# Patient Record
Sex: Male | Born: 1965
Health system: Southern US, Community
[De-identification: ages and names within clinical notes are randomized; demographics above are authoritative.]

## PROBLEM LIST (undated history)

## (undated) DIAGNOSIS — I509 Heart failure, unspecified: Secondary | ICD-10-CM

## (undated) DIAGNOSIS — F329 Major depressive disorder, single episode, unspecified: Secondary | ICD-10-CM

## (undated) DIAGNOSIS — K219 Gastro-esophageal reflux disease without esophagitis: Secondary | ICD-10-CM

## (undated) DIAGNOSIS — B20 Human immunodeficiency virus [HIV] disease: Secondary | ICD-10-CM

## (undated) DIAGNOSIS — I4891 Unspecified atrial fibrillation: Secondary | ICD-10-CM

## (undated) DIAGNOSIS — F419 Anxiety disorder, unspecified: Secondary | ICD-10-CM

## (undated) DIAGNOSIS — I10 Essential (primary) hypertension: Secondary | ICD-10-CM

## (undated) DIAGNOSIS — R0902 Hypoxemia: Secondary | ICD-10-CM

## (undated) DIAGNOSIS — F32A Depression, unspecified: Secondary | ICD-10-CM

## (undated) DIAGNOSIS — J439 Emphysema, unspecified: Secondary | ICD-10-CM

## (undated) DIAGNOSIS — E119 Type 2 diabetes mellitus without complications: Secondary | ICD-10-CM

## (undated) DIAGNOSIS — J449 Chronic obstructive pulmonary disease, unspecified: Secondary | ICD-10-CM

## (undated) HISTORY — DX: Emphysema, unspecified: J43.9

## (undated) HISTORY — DX: Hypoxemia: R09.02

## (undated) HISTORY — PX: EXPLORATORY LAPAROTOMY: SUR591

## (undated) HISTORY — PX: OTHER SURGICAL HISTORY: SHX169

## (undated) HISTORY — DX: Major depressive disorder, single episode, unspecified: F32.9

## (undated) HISTORY — DX: Heart failure, unspecified: I50.9

## (undated) HISTORY — DX: Essential (primary) hypertension: I10

## (undated) HISTORY — DX: Anxiety disorder, unspecified: F41.9

## (undated) HISTORY — DX: Depression, unspecified: F32.A

---

## 2012-09-30 ENCOUNTER — Encounter (HOSPITAL_COMMUNITY): Payer: Self-pay | Admitting: *Deleted

## 2012-09-30 ENCOUNTER — Emergency Department (INDEPENDENT_AMBULATORY_CARE_PROVIDER_SITE_OTHER)
Admission: EM | Admit: 2012-09-30 | Discharge: 2012-09-30 | Disposition: A | Discharge status: Home / Self Care | Payer: Self-pay | Source: Home / Self Care | Attending: Family Medicine | Admitting: Family Medicine

## 2012-09-30 ENCOUNTER — Emergency Department (INDEPENDENT_AMBULATORY_CARE_PROVIDER_SITE_OTHER): Payer: Self-pay

## 2012-09-30 DIAGNOSIS — J441 Chronic obstructive pulmonary disease with (acute) exacerbation: Secondary | ICD-10-CM

## 2012-09-30 HISTORY — DX: Chronic obstructive pulmonary disease, unspecified: J44.9

## 2012-09-30 MED ORDER — ALBUTEROL SULFATE (5 MG/ML) 0.5% IN NEBU
5.0000 mg | INHALATION_SOLUTION | Freq: Once | RESPIRATORY_TRACT | Status: AC
  Administered 2012-09-30: 5 mg via RESPIRATORY_TRACT

## 2012-09-30 MED ORDER — PREDNISONE 20 MG PO TABS: ORAL_TABLET | ORAL | Status: DC

## 2012-09-30 MED ORDER — ALBUTEROL SULFATE (5 MG/ML) 0.5% IN NEBU
INHALATION_SOLUTION | RESPIRATORY_TRACT | Status: AC
  Filled 2012-09-30: qty 1

## 2012-09-30 MED ORDER — FLUTICASONE-SALMETEROL 230-21 MCG/ACT IN AERO: 2.0000 | INHALATION_SPRAY | Freq: Two times a day (BID) | RESPIRATORY_TRACT | Status: DC

## 2012-09-30 MED ORDER — ALBUTEROL SULFATE HFA 108 (90 BASE) MCG/ACT IN AERS
2.0000 | INHALATION_SPRAY | Freq: Four times a day (QID) | RESPIRATORY_TRACT | Status: DC | PRN
  Administered 2012-09-30: 2 via RESPIRATORY_TRACT

## 2012-09-30 MED ORDER — METHYLPREDNISOLONE SODIUM SUCC 125 MG IJ SOLR
125.0000 mg | Freq: Once | INTRAMUSCULAR | Status: AC
  Administered 2012-09-30: 125 mg via INTRAMUSCULAR

## 2012-09-30 MED ORDER — METHYLPREDNISOLONE SODIUM SUCC 125 MG IJ SOLR
INTRAMUSCULAR | Status: AC
  Filled 2012-09-30: qty 2

## 2012-09-30 MED ORDER — ALBUTEROL SULFATE (5 MG/ML) 0.5% IN NEBU: 2.5000 mg | INHALATION_SOLUTION | Freq: Four times a day (QID) | RESPIRATORY_TRACT | Status: DC | PRN

## 2012-09-30 MED ORDER — SODIUM CHLORIDE 0.9 % IJ SOLN
INTRAMUSCULAR | Status: AC
  Filled 2012-09-30: qty 3

## 2012-09-30 MED ORDER — ALBUTEROL SULFATE HFA 108 (90 BASE) MCG/ACT IN AERS
INHALATION_SPRAY | RESPIRATORY_TRACT | Status: AC
  Filled 2012-09-30: qty 6.7

## 2012-09-30 MED ORDER — IPRATROPIUM BROMIDE 0.02 % IN SOLN
0.5000 mg | Freq: Once | RESPIRATORY_TRACT | Status: AC
  Administered 2012-09-30: 0.5 mg via RESPIRATORY_TRACT

## 2012-09-30 NOTE — ED Notes (Signed)
Received report from Nira Conn.

## 2012-09-30 NOTE — ED Notes (Signed)
Pt  Reports  5  Days  Of  Shortness  Of  Breath       He  Has  History  Of  Copd   usually  Takes  meds  But  Ran out  And  Has  No  pcp        Pt  Was  Sent  To  ucc  From      Adult  Care  Side  Because  They  Have  No openings           He        Is  A  Smoker       His  Lung  Sounds  Are  Tight  His skin is  Warm  And  Dry
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
## 2012-11-10 ENCOUNTER — Encounter (HOSPITAL_COMMUNITY): Payer: Self-pay | Admitting: *Deleted

## 2012-11-10 ENCOUNTER — Emergency Department (HOSPITAL_COMMUNITY)
Admission: EM | Admit: 2012-11-10 | Discharge: 2012-11-10 | Disposition: A | Discharge status: Home / Self Care | Payer: Self-pay | Source: Home / Self Care

## 2012-11-10 DIAGNOSIS — J441 Chronic obstructive pulmonary disease with (acute) exacerbation: Secondary | ICD-10-CM

## 2012-11-10 MED ORDER — METHYLPREDNISOLONE 4 MG PO KIT: PACK | ORAL | Status: DC
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
## 2012-11-10 NOTE — ED Notes (Signed)
Patient present to establish care. Refills on medication. Patient states he is HiV positive. Patient states he is with the Adap program and needs a doctor fax refills for medications to [facility].

## 2012-11-17 ENCOUNTER — Telehealth: Payer: Self-pay

## 2012-11-17 NOTE — Telephone Encounter (Signed)
Pt left message on voicemail stating he was referred by Urgent Care.   Call returned and message left on his voicemail to call for new patient appointment. (intake)  Laurell Josephs, RN

## 2012-11-29 ENCOUNTER — Telehealth: Payer: Self-pay

## 2012-11-29 NOTE — Telephone Encounter (Signed)
Pt called.  His ADAP has expired.   He will need to come to see Britta Mccreedy to get his financial clearance with [employer] and ADAP prior to intake.  Once he is approved I can call a script to the pharmacy. He can see me at his schedule intake time.  Laurell Josephs, RN

## 2012-11-29 NOTE — Telephone Encounter (Signed)
Pt will need to schedule intake visit.  Medical records received.  He is not sure if his ADAP is current and he has already called his previous medical provider for a prescription refill.  Medical records indicate his last OV was February 2014. Pt was advised to call the pharmacy to see if ADAP is active. If so we can call in medication prior to  intake.  He will check with pharmacy and call me back. Intake scheduled.    Laurell Josephs, RN

## 2012-12-06 ENCOUNTER — Telehealth: Payer: Self-pay

## 2012-12-06 ENCOUNTER — Other Ambulatory Visit: Payer: Self-pay

## 2012-12-06 ENCOUNTER — Ambulatory Visit (INDEPENDENT_AMBULATORY_CARE_PROVIDER_SITE_OTHER): Payer: Self-pay

## 2012-12-06 ENCOUNTER — Other Ambulatory Visit: Payer: Self-pay | Admitting: *Deleted

## 2012-12-06 ENCOUNTER — Ambulatory Visit: Payer: Self-pay

## 2012-12-06 ENCOUNTER — Other Ambulatory Visit: Payer: Self-pay | Admitting: Infectious Diseases

## 2012-12-06 DIAGNOSIS — B20 Human immunodeficiency virus [HIV] disease: Secondary | ICD-10-CM

## 2012-12-06 DIAGNOSIS — Z79899 Other long term (current) drug therapy: Secondary | ICD-10-CM

## 2012-12-06 DIAGNOSIS — Z113 Encounter for screening for infections with a predominantly sexual mode of transmission: Secondary | ICD-10-CM

## 2012-12-06 LAB — CBC WITH DIFFERENTIAL/PLATELET
Basophils Absolute: 0 10*3/uL (ref 0.0–0.1)
Basophils Relative: 0 % (ref 0–1)
HCT: 40.4 % (ref 39.0–52.0)
Hemoglobin: 13.5 g/dL (ref 13.0–17.0)
Lymphocytes Relative: 43 % (ref 12–46)
MCHC: 33.4 g/dL (ref 30.0–36.0)
Monocytes Absolute: 0.5 10*3/uL (ref 0.1–1.0)
Monocytes Relative: 12 % (ref 3–12)
Neutro Abs: 1.9 10*3/uL (ref 1.7–7.7)
Neutrophils Relative %: 42 % — ABNORMAL LOW (ref 43–77)
WBC: 4.5 10*3/uL (ref 4.0–10.5)

## 2012-12-06 MED ORDER — ETRAVIRINE 100 MG PO TABS: 200.0000 mg | ORAL_TABLET | Freq: Two times a day (BID) | ORAL | Status: DC

## 2012-12-06 MED ORDER — RITONAVIR 100 MG PO CAPS
100.0000 mg | ORAL_CAPSULE | Freq: Two times a day (BID) | ORAL | Status: DC
Start: 2012-12-06 — End: 2012-12-14

## 2012-12-06 MED ORDER — DARUNAVIR ETHANOLATE 600 MG PO TABS: 600.0000 mg | ORAL_TABLET | Freq: Every day | ORAL | Status: DC

## 2012-12-06 MED ORDER — RALTEGRAVIR POTASSIUM 400 MG PO TABS
400.0000 mg | ORAL_TABLET | Freq: Two times a day (BID) | ORAL | Status: DC
Start: 2012-12-06 — End: 2012-12-14

## 2012-12-06 NOTE — Progress Notes (Signed)
Pt has been approved for [employer] and ADAP.  He has been without his medications now for one week.  He gives past history of taking several "breaks " from the medications this year.  Medical records document last CD4 as 40 as of 06-08-12.   We will submit his paperwork along with signed scripts today.   Medical records document last CD4 as 40 as of 06-08-12.   Pt has multiple complaints today:  Rash on face and upper body X 1 week with occasional memory loss which has worsened within the last 2 months.   He has been scheduled for a return visit in two weeks after labs are completed.   No intake was completed at this visit since he was her for financial clearance only.  Information documented obtained from medical record.   Laurell Josephs, RN

## 2012-12-06 NOTE — Telephone Encounter (Signed)
Pt is here to see Britta Mccreedy for financial assistance.  He has been approved for ADAP and [employer].  I will enter his lab orders and schedule a new office visit.   Records received.   Laurell Josephs, RN

## 2012-12-06 NOTE — Telephone Encounter (Signed)
Rx printed to go with patient ADAP application

## 2012-12-06 NOTE — Progress Notes (Unsigned)
Patient states he is having problems with his memory X 4-5 months .  He currently has a rash on his face,  neck and upper body.  He states he has taken several "breaks" in the last few years because he thought he was feeling better.

## 2012-12-07 LAB — URINALYSIS
Nitrite: NEGATIVE
Protein, ur: NEGATIVE mg/dL
Specific Gravity, Urine: 1.02 (ref 1.005–1.030)
Urobilinogen, UA: 1 mg/dL (ref 0.0–1.0)

## 2012-12-07 LAB — COMPLETE METABOLIC PANEL WITH GFR
AST: 14 U/L (ref 0–37)
Albumin: 3.9 g/dL (ref 3.5–5.2)
Alkaline Phosphatase: 34 U/L — ABNORMAL LOW (ref 39–117)
BUN: 7 mg/dL (ref 6–23)
Calcium: 9.2 mg/dL (ref 8.4–10.5)
Chloride: 100 mEq/L (ref 96–112)
Glucose, Bld: 159 mg/dL — ABNORMAL HIGH (ref 70–99)
Potassium: 4.2 mEq/L (ref 3.5–5.3)
Sodium: 138 mEq/L (ref 135–145)
Total Protein: 7.8 g/dL (ref 6.0–8.3)

## 2012-12-07 LAB — LIPID PANEL
Cholesterol: 237 mg/dL — ABNORMAL HIGH (ref 0–200)
Triglycerides: 181 mg/dL — ABNORMAL HIGH (ref <150)

## 2012-12-07 LAB — HIV-1 RNA ULTRAQUANT REFLEX TO GENTYP+: HIV-1 RNA Quant, Log: 1.94 {Log} — ABNORMAL HIGH (ref <1.30)

## 2012-12-07 LAB — HEPATITIS B CORE ANTIBODY, TOTAL: Hep B Core Total Ab: NEGATIVE

## 2012-12-07 LAB — HEPATITIS C ANTIBODY: HCV Ab: REACTIVE — AB

## 2012-12-09 ENCOUNTER — Ambulatory Visit: Payer: Self-pay

## 2012-12-14 ENCOUNTER — Emergency Department (HOSPITAL_COMMUNITY)
Admission: EM | Admit: 2012-12-14 | Discharge: 2012-12-14 | Disposition: A | Discharge status: Home / Self Care | Payer: Self-pay | Attending: Emergency Medicine | Admitting: Emergency Medicine

## 2012-12-14 ENCOUNTER — Encounter (HOSPITAL_COMMUNITY): Payer: Self-pay | Admitting: Emergency Medicine

## 2012-12-14 ENCOUNTER — Emergency Department (HOSPITAL_COMMUNITY): Payer: Self-pay

## 2012-12-14 DIAGNOSIS — M545 Low back pain, unspecified: Secondary | ICD-10-CM | POA: Insufficient documentation

## 2012-12-14 DIAGNOSIS — L0291 Cutaneous abscess, unspecified: Secondary | ICD-10-CM

## 2012-12-14 DIAGNOSIS — R0602 Shortness of breath: Secondary | ICD-10-CM | POA: Insufficient documentation

## 2012-12-14 DIAGNOSIS — G8929 Other chronic pain: Secondary | ICD-10-CM | POA: Insufficient documentation

## 2012-12-14 DIAGNOSIS — Z21 Asymptomatic human immunodeficiency virus [HIV] infection status: Secondary | ICD-10-CM | POA: Insufficient documentation

## 2012-12-14 DIAGNOSIS — Z79899 Other long term (current) drug therapy: Secondary | ICD-10-CM | POA: Insufficient documentation

## 2012-12-14 DIAGNOSIS — IMO0002 Reserved for concepts with insufficient information to code with codable children: Secondary | ICD-10-CM | POA: Insufficient documentation

## 2012-12-14 DIAGNOSIS — R062 Wheezing: Secondary | ICD-10-CM | POA: Insufficient documentation

## 2012-12-14 DIAGNOSIS — F411 Generalized anxiety disorder: Secondary | ICD-10-CM | POA: Insufficient documentation

## 2012-12-14 DIAGNOSIS — J441 Chronic obstructive pulmonary disease with (acute) exacerbation: Secondary | ICD-10-CM | POA: Insufficient documentation

## 2012-12-14 DIAGNOSIS — L0390 Cellulitis, unspecified: Secondary | ICD-10-CM

## 2012-12-14 DIAGNOSIS — F17200 Nicotine dependence, unspecified, uncomplicated: Secondary | ICD-10-CM | POA: Insufficient documentation

## 2012-12-14 DIAGNOSIS — N61 Mastitis without abscess: Secondary | ICD-10-CM | POA: Insufficient documentation

## 2012-12-14 HISTORY — DX: Human immunodeficiency virus (HIV) disease: B20

## 2012-12-14 LAB — CBC WITH DIFFERENTIAL/PLATELET
Eosinophils Absolute: 0 10*3/uL (ref 0.0–0.7)
Eosinophils Relative: 0 % (ref 0–5)
MCH: 28.8 pg (ref 26.0–34.0)
MCHC: 33.8 g/dL (ref 30.0–36.0)
Monocytes Absolute: 0.9 10*3/uL (ref 0.1–1.0)
Neutrophils Relative %: 46 % (ref 43–77)
Platelets: 131 10*3/uL — ABNORMAL LOW (ref 150–400)
RBC: 4.59 MIL/uL (ref 4.22–5.81)

## 2012-12-14 LAB — BASIC METABOLIC PANEL
Calcium: 8.6 mg/dL (ref 8.4–10.5)
GFR calc Af Amer: >90 mL/min (ref >90)
GFR calc non Af Amer: >90 mL/min (ref >90)
Sodium: 135 mEq/L (ref 135–145)

## 2012-12-14 MED ORDER — DOXYCYCLINE HYCLATE 100 MG PO TABS
100.0000 mg | ORAL_TABLET | Freq: Once | ORAL | Status: AC
  Administered 2012-12-14: 100 mg via ORAL
  Filled 2012-12-14: qty 1

## 2012-12-14 MED ORDER — OXYCODONE-ACETAMINOPHEN 5-325 MG PO TABS
2.0000 | ORAL_TABLET | Freq: Once | ORAL | Status: AC
  Administered 2012-12-14: 2 via ORAL
  Filled 2012-12-14: qty 2

## 2012-12-14 MED ORDER — ALBUTEROL SULFATE HFA 108 (90 BASE) MCG/ACT IN AERS
2.0000 | INHALATION_SPRAY | Freq: Once | RESPIRATORY_TRACT | Status: AC
  Administered 2012-12-14: 2 via RESPIRATORY_TRACT
  Filled 2012-12-14: qty 6.7

## 2012-12-14 MED ORDER — OXYCODONE-ACETAMINOPHEN 5-325 MG PO TABS: 1.0000 | ORAL_TABLET | Freq: Four times a day (QID) | ORAL | Status: DC | PRN

## 2012-12-14 MED ORDER — CEPHALEXIN 500 MG PO CAPS: 500.0000 mg | ORAL_CAPSULE | Freq: Four times a day (QID) | ORAL | Status: DC

## 2012-12-14 MED ORDER — IPRATROPIUM BROMIDE 0.02 % IN SOLN
0.5000 mg | Freq: Once | RESPIRATORY_TRACT | Status: AC
  Administered 2012-12-14: 0.5 mg via RESPIRATORY_TRACT
  Filled 2012-12-14: qty 2.5

## 2012-12-14 MED ORDER — ALBUTEROL SULFATE (5 MG/ML) 0.5% IN NEBU
5.0000 mg | INHALATION_SOLUTION | Freq: Once | RESPIRATORY_TRACT | Status: AC
  Administered 2012-12-14: 5 mg via RESPIRATORY_TRACT
  Filled 2012-12-14: qty 1
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
## 2012-12-14 NOTE — ED Notes (Signed)
Patient transported to X-ray 

## 2012-12-14 NOTE — ED Notes (Signed)
Pt did not answer x 1 

## 2012-12-14 NOTE — ED Notes (Signed)
Pt here c/o lower back pain x 1 month; pt sts right breast/chest abscess; pt sts hx of same with swelling and pain

## 2012-12-16 LAB — WOUND CULTURE

## 2012-12-17 ENCOUNTER — Telehealth (HOSPITAL_COMMUNITY): Payer: Self-pay | Admitting: Emergency Medicine

## 2012-12-17 NOTE — ED Notes (Signed)
Post ED Visit - Positive Culture Follow-up  Culture report reviewed by antimicrobial stewardship pharmacist: []  Wes Dulaney, Pharm.D., BCPS [x]  Celedonio Miyamoto, Pharm.D., BCPS []  Georgina Pillion, Pharm.D., BCPS []  Brandon, [address].D., BCPS, AAHIVP []  Estella Husk, Pharm.D., BCPS, AAHIV  Positive urine culture Treated with keflex, organism sensitive to the same and no further patient follow-up is required at this time.  Kylie A Holland 12/17/2012, 2:19 PM

## 2012-12-20 ENCOUNTER — Ambulatory Visit: Payer: Self-pay | Admitting: Internal Medicine

## 2012-12-23 ENCOUNTER — Encounter: Payer: Self-pay | Admitting: Internal Medicine

## 2012-12-23 ENCOUNTER — Ambulatory Visit (INDEPENDENT_AMBULATORY_CARE_PROVIDER_SITE_OTHER): Payer: Self-pay | Admitting: Internal Medicine

## 2012-12-23 ENCOUNTER — Other Ambulatory Visit: Payer: Self-pay | Admitting: Internal Medicine

## 2012-12-23 VITALS — BP 115/68 | HR 56 | Temp 98.1°F | Ht 67.0 in | Wt 267.0 lb

## 2012-12-23 DIAGNOSIS — B20 Human immunodeficiency virus [HIV] disease: Secondary | ICD-10-CM

## 2012-12-23 DIAGNOSIS — J441 Chronic obstructive pulmonary disease with (acute) exacerbation: Secondary | ICD-10-CM

## 2012-12-23 MED ORDER — ALBUTEROL SULFATE (5 MG/ML) 0.5% IN NEBU: 2.5000 mg | INHALATION_SOLUTION | Freq: Four times a day (QID) | RESPIRATORY_TRACT | Status: DC | PRN

## 2012-12-23 MED ORDER — AZITHROMYCIN 600 MG PO TABS: 600.0000 mg | ORAL_TABLET | ORAL | Status: DC

## 2012-12-23 MED ORDER — ALBUTEROL SULFATE HFA 108 (90 BASE) MCG/ACT IN AERS: 2.0000 | INHALATION_SPRAY | Freq: Four times a day (QID) | RESPIRATORY_TRACT | Status: DC | PRN

## 2012-12-23 MED ORDER — ETRAVIRINE 200 MG PO TABS: 1.0000 | ORAL_TABLET | Freq: Two times a day (BID) | ORAL | Status: DC

## 2012-12-23 MED ORDER — RITONAVIR 100 MG PO CAPS: 100.0000 mg | ORAL_CAPSULE | Freq: Two times a day (BID) | ORAL | Status: DC

## 2012-12-23 MED ORDER — RALTEGRAVIR POTASSIUM 400 MG PO TABS: 400.0000 mg | ORAL_TABLET | Freq: Two times a day (BID) | ORAL | Status: DC

## 2012-12-23 MED ORDER — DARUNAVIR ETHANOLATE 600 MG PO TABS: 600.0000 mg | ORAL_TABLET | Freq: Two times a day (BID) | ORAL | Status: DC

## 2012-12-23 MED ORDER — DAPSONE 100 MG PO TABS: 100.0000 mg | ORAL_TABLET | Freq: Every day | ORAL | Status: DC

## 2012-12-23 MED ORDER — TIOTROPIUM BROMIDE MONOHYDRATE 18 MCG IN CAPS: 18.0000 ug | ORAL_CAPSULE | Freq: Every day | RESPIRATORY_TRACT | Status: DC

## 2012-12-23 NOTE — Progress Notes (Signed)
[facility] NOTE  RFV: establishing care Subjective:    Patient ID: Chris [name], male    DOB: July 19, 1966, 48 y.o.   MRN: [id number]  HPI Chris [name] a 47yo M with HIV-HCV co-infection, dx in 1995, with numerous regimens in the past, restarted meds in 12/2011 when cd 4 count of 10/VL 141,410 . Current cd 4 count of 100/ VL 87 on RAL/DRVr/ETR as well as OI proph with dapsone, azithro, and fluc. Ran out of medications 2 wks ago. Had to reapply ADAP, now approved.  Hep A - in 2012, hep BSab+, HCV +t, toxo -, RPR -, TST -  All: bactrim   Now lives in [place] for past 4 month back with wife and 2 step daughters. He is trying to get disability because of significant COPD  Current Outpatient Prescriptions on File Prior to Visit  Medication Sig Dispense Refill  . albuterol (PROVENTIL HFA;VENTOLIN HFA) 108 (90 BASE) MCG/ACT inhaler Inhale 2 puffs into the lungs every 6 (six) hours as needed for wheezing.      [name] albuterol (PROVENTIL) (5 MG/ML) 0.5% nebulizer solution Take 2.5 mg by nebulization every 6 (six) hours as needed for wheezing or shortness of breath.      [name] azithromycin (ZITHROMAX) 600 MG tablet Take 600 mg by mouth 2 (two) times a week. Take 1 tablet by mouth every Monday and Friday.      . cephALEXin (KEFLEX) 500 MG capsule Take 1 capsule (500 mg total) by mouth 4 (four) times daily.  40 capsule  0  . dapsone 100 MG tablet Take 100 mg by mouth daily.      . darunavir (PREZISTA) 600 MG tablet Take 600 mg by mouth 2 (two) times daily.      [name] etravirine (INTELENCE) 100 MG tablet Take 200 mg by mouth 2 (two) times daily with a meal.      . Fluticasone-Salmeterol (ADVAIR) 250-50 MCG/DOSE AEPB Inhale 1 puff into the lungs every 12 (twelve) hours.      [name] oxyCODONE-acetaminophen (PERCOCET) 5-325 MG per tablet Take 1 tablet by mouth every 6 (six) hours as needed for pain.  16 tablet  0  . raltegravir (ISENTRESS) 400 MG tablet Take 400 mg by mouth 2 (two) times daily.      . ritonavir (NORVIR)  100 MG capsule Take 100 mg by mouth 2 (two) times daily.      [name] tiotropium (SPIRIVA) 18 MCG inhalation capsule Place 18 mcg into inhaler and inhale daily.       No current facility-administered medications on file prior to visit.     Current Outpatient Prescriptions on File Prior to Visit  Medication Sig Dispense Refill  . albuterol (PROVENTIL HFA;VENTOLIN HFA) 108 (90 BASE) MCG/ACT inhaler Inhale 2 puffs into the lungs every 6 (six) hours as needed for wheezing.      [name] albuterol (PROVENTIL) (5 MG/ML) 0.5% nebulizer solution Take 2.5 mg by nebulization every 6 (six) hours as needed for wheezing or shortness of breath.      [name] azithromycin (ZITHROMAX) 600 MG tablet Take 600 mg by mouth 2 (two) times a week. Take 1 tablet by mouth every Monday and Friday.      . cephALEXin (KEFLEX) 500 MG capsule Take 1 capsule (500 mg total) by mouth 4 (four) times daily.  40 capsule  0  . dapsone 100 MG tablet Take 100 mg by mouth daily.      . darunavir (PREZISTA) 600 MG tablet Take 600 mg by  mouth 2 (two) times daily.      [name] etravirine (INTELENCE) 100 MG tablet Take 200 mg by mouth 2 (two) times daily with a meal.      . Fluticasone-Salmeterol (ADVAIR) 250-50 MCG/DOSE AEPB Inhale 1 puff into the lungs every 12 (twelve) hours.      [name] oxyCODONE-acetaminophen (PERCOCET) 5-325 MG per tablet Take 1 tablet by mouth every 6 (six) hours as needed for pain.  16 tablet  0  . raltegravir (ISENTRESS) 400 MG tablet Take 400 mg by mouth 2 (two) times daily.      . ritonavir (NORVIR) 100 MG capsule Take 100 mg by mouth 2 (two) times daily.      [name] tiotropium (SPIRIVA) 18 MCG inhalation capsule Place 18 mcg into inhaler and inhale daily.       No current facility-administered medications on file prior to visit.   Active Ambulatory Problems    Diagnosis Date Noted  . HIV disease 12/06/2012   Resolved Ambulatory Problems    Diagnosis Date Noted  . No Resolved Ambulatory Problems   Past Medical History  Diagnosis Date   . COPD (chronic obstructive pulmonary disease)     Social hx: 4 cigs/day, continuing to decrease smoking. No drinking.  Family hx: HTN, DM, breast ca, throat cancer (father and grandmother)  Review of Systems  Constitutional: Negative for fever, chills, diaphoresis, activity change, appetite change, fatigue and unexpected weight change.  HENT: Negative for congestion, sore throat, rhinorrhea, sneezing, trouble swallowing and sinus pressure.  Eyes: Negative for photophobia and visual disturbance.  Respiratory: Negative for cough, chest tightness, shortness of breath, wheezing and stridor.  Cardiovascular: Negative for chest pain, palpitations and leg swelling.  Gastrointestinal: Negative for nausea, vomiting, abdominal pain, diarrhea, constipation, blood in stool, abdominal distention and anal bleeding.  Genitourinary: Negative for dysuria, hematuria, flank pain and difficulty urinating.  Musculoskeletal: Negative for myalgias, back pain, joint swelling, arthralgias and gait problem.  Skin: rash, itching Neurological: Negative for dizziness, tremors, weakness and light-headedness.  Hematological: Negative for adenopathy. Does not bruise/bleed easily.  Psychiatric/Behavioral: Negative for behavioral problems, confusion, sleep disturbance, dysphoric mood, decreased concentration and agitation.       Objective:   Physical Exam BP 115/68  Pulse 56  Temp(Src) 98.1 F (36.7 C) (Oral)  Ht 5\' 7"  (1.702 m)  Wt 267 lb (121.11 kg)  BMI 41.81 kg/m2 Physical Exam  Constitutional: He is oriented to person, place, and time. He appears well-developed and well-nourished. No distress.  HENT:  Mouth/Throat: Oropharynx is clear and moist. No oropharyngeal exudate.  Cardiovascular: Normal rate, regular rhythm and normal heart sounds. Exam reveals no gallop and no friction rub.  No murmur heard.  Pulmonary/Chest: Effort normal and breath sounds normal. No respiratory distress. He has no wheezes.   Abdominal: Soft. Bowel sounds are normal. He exhibits no distension. There is no tenderness.  Lymphadenopathy:  He has no cervical adenopathy.  Neurological: He is alert and oriented to person, place, and time.  Skin: Skin is warm and dry. No rash noted. No erythema.  Psychiatric: He has a normal mood and affect. His behavior is normal.       Assessment & Plan:  HIV = need to restart his salvage regimen. We will restart his past regimen. If still has viremia, will do genotype  HCV = at next visit, we will check genotype  OI prophylaxis = will dapsone until cd 4 count >200, azithromycin likely for an addition 3 months  Thrush = fluconazole 200  mg daily  Needs pcp for COPD management, will also need orange card -> to see if can get referral to pulmonology  RTC 1 month

## 2012-12-24 LAB — HIV-1 RNA ULTRAQUANT REFLEX TO GENTYP+
HIV 1 RNA Quant: 105280 copies/mL — ABNORMAL HIGH (ref <20)
HIV-1 RNA Quant, Log: 5.02 {Log} — ABNORMAL HIGH (ref <1.30)

## 2013-01-10 ENCOUNTER — Emergency Department (HOSPITAL_COMMUNITY): Payer: Medicaid Other

## 2013-01-10 ENCOUNTER — Inpatient Hospital Stay (HOSPITAL_COMMUNITY)
Admission: EM | Admit: 2013-01-10 | Discharge: 2013-01-13 | DRG: 190 | Disposition: A | Discharge status: Home / Self Care | Payer: Medicaid Other | Attending: Internal Medicine | Admitting: Internal Medicine

## 2013-01-10 ENCOUNTER — Encounter (HOSPITAL_COMMUNITY): Payer: Self-pay | Admitting: Emergency Medicine

## 2013-01-10 DIAGNOSIS — J9600 Acute respiratory failure, unspecified whether with hypoxia or hypercapnia: Secondary | ICD-10-CM | POA: Diagnosis present

## 2013-01-10 DIAGNOSIS — J209 Acute bronchitis, unspecified: Principal | ICD-10-CM | POA: Diagnosis present

## 2013-01-10 DIAGNOSIS — F17200 Nicotine dependence, unspecified, uncomplicated: Secondary | ICD-10-CM | POA: Diagnosis present

## 2013-01-10 DIAGNOSIS — R7309 Other abnormal glucose: Secondary | ICD-10-CM | POA: Diagnosis present

## 2013-01-10 DIAGNOSIS — J449 Chronic obstructive pulmonary disease, unspecified: Secondary | ICD-10-CM | POA: Diagnosis present

## 2013-01-10 DIAGNOSIS — T380X5A Adverse effect of glucocorticoids and synthetic analogues, initial encounter: Secondary | ICD-10-CM | POA: Diagnosis present

## 2013-01-10 DIAGNOSIS — R739 Hyperglycemia, unspecified: Secondary | ICD-10-CM

## 2013-01-10 DIAGNOSIS — B20 Human immunodeficiency virus [HIV] disease: Secondary | ICD-10-CM | POA: Diagnosis present

## 2013-01-10 DIAGNOSIS — J440 Chronic obstructive pulmonary disease with acute lower respiratory infection: Principal | ICD-10-CM | POA: Diagnosis present

## 2013-01-10 DIAGNOSIS — J9601 Acute respiratory failure with hypoxia: Secondary | ICD-10-CM

## 2013-01-10 DIAGNOSIS — Z79899 Other long term (current) drug therapy: Secondary | ICD-10-CM

## 2013-01-10 DIAGNOSIS — Z720 Tobacco use: Secondary | ICD-10-CM | POA: Diagnosis present

## 2013-01-10 DIAGNOSIS — J9611 Chronic respiratory failure with hypoxia: Secondary | ICD-10-CM

## 2013-01-10 DIAGNOSIS — J441 Chronic obstructive pulmonary disease with (acute) exacerbation: Secondary | ICD-10-CM

## 2013-01-10 LAB — POCT I-STAT, CHEM 8
Chloride: 104 mEq/L (ref 96–112)
Glucose, Bld: 101 mg/dL — ABNORMAL HIGH (ref 70–99)
HCT: 46 % (ref 39.0–52.0)
Potassium: 4.1 mEq/L (ref 3.5–5.1)
Sodium: 142 mEq/L (ref 135–145)

## 2013-01-10 LAB — CBC WITH DIFFERENTIAL/PLATELET
Basophils Absolute: 0 10*3/uL (ref 0.0–0.1)
Basophils Relative: 0 % (ref 0–1)
Eosinophils Relative: 0 % (ref 0–5)
HCT: 44 % (ref 39.0–52.0)
MCHC: 31.4 g/dL (ref 30.0–36.0)
MCV: 91.5 fL (ref 78.0–100.0)
Monocytes Absolute: 1.5 10*3/uL — ABNORMAL HIGH (ref 0.1–1.0)
RDW: 14 % (ref 11.5–15.5)

## 2013-01-10 LAB — COMPREHENSIVE METABOLIC PANEL
AST: 31 U/L (ref 0–37)
BUN: 17 mg/dL (ref 6–23)
CO2: 28 mEq/L (ref 19–32)
Calcium: 9.3 mg/dL (ref 8.4–10.5)
Creatinine, Ser: 0.79 mg/dL (ref 0.50–1.35)
GFR calc non Af Amer: >90 mL/min (ref >90)

## 2013-01-10 MED ORDER — ALBUTEROL SULFATE (5 MG/ML) 0.5% IN NEBU
2.5000 mg | INHALATION_SOLUTION | Freq: Once | RESPIRATORY_TRACT | Status: AC
  Administered 2013-01-11: 2.5 mg via RESPIRATORY_TRACT
  Filled 2013-01-10: qty 0.5

## 2013-01-10 MED ORDER — IPRATROPIUM BROMIDE 0.02 % IN SOLN
0.5000 mg | Freq: Once | RESPIRATORY_TRACT | Status: AC
  Administered 2013-01-11: 0.5 mg via RESPIRATORY_TRACT
  Filled 2013-01-10: qty 2.5

## 2013-01-10 MED ORDER — PREDNISONE 20 MG PO TABS
60.0000 mg | ORAL_TABLET | Freq: Once | ORAL | Status: AC
  Administered 2013-01-11: 60 mg via ORAL
  Filled 2013-01-10: qty 3

## 2013-01-10 NOTE — ED Notes (Signed)
The patient is in radiology at this time.

## 2013-01-10 NOTE — ED Notes (Addendum)
Pt c/o shortness of breath onset 2 days ago.  Clear productive cough.  Pt denies chest pain.  Pt diaphoretic in triage

## 2013-01-11 ENCOUNTER — Encounter (HOSPITAL_COMMUNITY): Payer: Self-pay | Admitting: *Deleted

## 2013-01-11 DIAGNOSIS — J449 Chronic obstructive pulmonary disease, unspecified: Secondary | ICD-10-CM | POA: Diagnosis present

## 2013-01-11 DIAGNOSIS — J441 Chronic obstructive pulmonary disease with (acute) exacerbation: Secondary | ICD-10-CM

## 2013-01-11 DIAGNOSIS — F17200 Nicotine dependence, unspecified, uncomplicated: Secondary | ICD-10-CM

## 2013-01-11 DIAGNOSIS — B20 Human immunodeficiency virus [HIV] disease: Secondary | ICD-10-CM

## 2013-01-11 DIAGNOSIS — Z720 Tobacco use: Secondary | ICD-10-CM | POA: Diagnosis present

## 2013-01-11 LAB — BASIC METABOLIC PANEL
CO2: 26 mEq/L (ref 19–32)
Calcium: 9 mg/dL (ref 8.4–10.5)
Creatinine, Ser: 0.72 mg/dL (ref 0.50–1.35)
Glucose, Bld: 334 mg/dL — ABNORMAL HIGH (ref 70–99)

## 2013-01-11 LAB — CBC
MCH: 29 pg (ref 26.0–34.0)
MCV: 91 fL (ref 78.0–100.0)
Platelets: 160 10*3/uL (ref 150–400)
RBC: 4.58 MIL/uL (ref 4.22–5.81)
RDW: 13.9 % (ref 11.5–15.5)

## 2013-01-11 LAB — GLUCOSE, CAPILLARY: Glucose-Capillary: 382 mg/dL — ABNORMAL HIGH (ref 70–99)

## 2013-01-11 MED ORDER — BENZONATATE 100 MG PO CAPS
200.0000 mg | ORAL_CAPSULE | Freq: Three times a day (TID) | ORAL | Status: DC | PRN
  Filled 2013-01-11: qty 2

## 2013-01-11 MED ORDER — INSULIN ASPART 100 UNIT/ML ~~LOC~~ SOLN
0.0000 [IU] | Freq: Three times a day (TID) | SUBCUTANEOUS | Status: DC
  Administered 2013-01-11: 7 [IU] via SUBCUTANEOUS
  Administered 2013-01-12 (×2): 20 [IU] via SUBCUTANEOUS
  Administered 2013-01-12: 15 [IU] via SUBCUTANEOUS
  Administered 2013-01-13 (×2): 4 [IU] via SUBCUTANEOUS

## 2013-01-11 MED ORDER — ACETAMINOPHEN 650 MG RE SUPP: 650.0000 mg | Freq: Four times a day (QID) | RECTAL | Status: DC | PRN

## 2013-01-11 MED ORDER — DAPSONE 100 MG PO TABS
100.0000 mg | ORAL_TABLET | Freq: Every day | ORAL | Status: DC
  Administered 2013-01-11 – 2013-01-13 (×3): 100 mg via ORAL
  Filled 2013-01-11 (×3): qty 1

## 2013-01-11 MED ORDER — METHYLPREDNISOLONE SODIUM SUCC 125 MG IJ SOLR
60.0000 mg | Freq: Four times a day (QID) | INTRAMUSCULAR | Status: DC
  Administered 2013-01-11 (×2): 60 mg via INTRAVENOUS
  Filled 2013-01-11 (×5): qty 0.96

## 2013-01-11 MED ORDER — ETRAVIRINE 200 MG PO TABS
200.0000 mg | ORAL_TABLET | Freq: Two times a day (BID) | ORAL | Status: DC
  Administered 2013-01-11: 200 mg via ORAL
  Filled 2013-01-11 (×2): qty 1

## 2013-01-11 MED ORDER — IPRATROPIUM BROMIDE 0.02 % IN SOLN
0.5000 mg | Freq: Four times a day (QID) | RESPIRATORY_TRACT | Status: DC
  Administered 2013-01-11 (×2): 0.5 mg via RESPIRATORY_TRACT
  Filled 2013-01-11: qty 2.5

## 2013-01-11 MED ORDER — HYDROMORPHONE HCL PF 1 MG/ML IJ SOLN: 1.0000 mg | INTRAMUSCULAR | Status: DC | PRN

## 2013-01-11 MED ORDER — ONDANSETRON HCL 4 MG PO TABS: 4.0000 mg | ORAL_TABLET | Freq: Four times a day (QID) | ORAL | Status: DC | PRN

## 2013-01-11 MED ORDER — RALTEGRAVIR POTASSIUM 400 MG PO TABS
400.0000 mg | ORAL_TABLET | Freq: Two times a day (BID) | ORAL | Status: DC
  Administered 2013-01-11 – 2013-01-13 (×5): 400 mg via ORAL
  Filled 2013-01-11 (×6): qty 1

## 2013-01-11 MED ORDER — PREDNISONE 50 MG PO TABS
60.0000 mg | ORAL_TABLET | Freq: Two times a day (BID) | ORAL | Status: DC
  Administered 2013-01-11 – 2013-01-12 (×2): 60 mg via ORAL
  Filled 2013-01-11 (×3): qty 1

## 2013-01-11 MED ORDER — SODIUM CHLORIDE 0.9 % IJ SOLN
3.0000 mL | Freq: Two times a day (BID) | INTRAMUSCULAR | Status: DC
  Administered 2013-01-11 – 2013-01-13 (×4): 3 mL via INTRAVENOUS

## 2013-01-11 MED ORDER — LEVOFLOXACIN 500 MG PO TABS
500.0000 mg | ORAL_TABLET | Freq: Every day | ORAL | Status: DC
  Administered 2013-01-11 – 2013-01-12 (×2): 500 mg via ORAL
  Filled 2013-01-11 (×3): qty 1

## 2013-01-11 MED ORDER — IPRATROPIUM BROMIDE 0.02 % IN SOLN
0.5000 mg | RESPIRATORY_TRACT | Status: DC
  Administered 2013-01-11 – 2013-01-13 (×9): 0.5 mg via RESPIRATORY_TRACT
  Filled 2013-01-11 (×10): qty 2.5

## 2013-01-11 MED ORDER — ONDANSETRON HCL 4 MG/2ML IJ SOLN: 4.0000 mg | Freq: Four times a day (QID) | INTRAMUSCULAR | Status: DC | PRN

## 2013-01-11 MED ORDER — ALBUTEROL SULFATE (5 MG/ML) 0.5% IN NEBU
2.5000 mg | INHALATION_SOLUTION | Freq: Once | RESPIRATORY_TRACT | Status: AC
  Administered 2013-01-11: 2.5 mg via RESPIRATORY_TRACT
  Filled 2013-01-11: qty 0.5

## 2013-01-11 MED ORDER — DARUNAVIR ETHANOLATE 600 MG PO TABS
600.0000 mg | ORAL_TABLET | Freq: Two times a day (BID) | ORAL | Status: DC
  Administered 2013-01-11 – 2013-01-13 (×5): 600 mg via ORAL
  Filled 2013-01-11 (×7): qty 1

## 2013-01-11 MED ORDER — IPRATROPIUM BROMIDE 0.02 % IN SOLN
0.5000 mg | Freq: Once | RESPIRATORY_TRACT | Status: AC
  Administered 2013-01-11: 0.5 mg via RESPIRATORY_TRACT
  Filled 2013-01-11: qty 2.5

## 2013-01-11 MED ORDER — HYDROCOD POLST-CHLORPHEN POLST 10-8 MG/5ML PO LQCR
5.0000 mL | Freq: Two times a day (BID) | ORAL | Status: DC
  Administered 2013-01-11 – 2013-01-13 (×5): 5 mL via ORAL
  Filled 2013-01-11 (×5): qty 5

## 2013-01-11 MED ORDER — ACETAMINOPHEN 325 MG PO TABS: 650.0000 mg | ORAL_TABLET | Freq: Four times a day (QID) | ORAL | Status: DC | PRN

## 2013-01-11 MED ORDER — AZITHROMYCIN 600 MG PO TABS
600.0000 mg | ORAL_TABLET | ORAL | Status: DC
  Administered 2013-01-13: 600 mg via ORAL
  Filled 2013-01-11: qty 1

## 2013-01-11 MED ORDER — ENOXAPARIN SODIUM 40 MG/0.4ML ~~LOC~~ SOLN
40.0000 mg | Freq: Every day | SUBCUTANEOUS | Status: DC
  Administered 2013-01-11 – 2013-01-13 (×3): 40 mg via SUBCUTANEOUS
  Filled 2013-01-11 (×3): qty 0.4

## 2013-01-11 MED ORDER — NICOTINE 21 MG/24HR TD PT24
21.0000 mg | MEDICATED_PATCH | Freq: Every day | TRANSDERMAL | Status: DC
  Administered 2013-01-11 – 2013-01-13 (×3): 21 mg via TRANSDERMAL
  Filled 2013-01-11 (×3): qty 1

## 2013-01-11 MED ORDER — INSULIN DETEMIR 100 UNIT/ML ~~LOC~~ SOLN
5.0000 [IU] | Freq: Every day | SUBCUTANEOUS | Status: DC
  Administered 2013-01-11: 5 [IU] via SUBCUTANEOUS
  Filled 2013-01-11 (×2): qty 0.05

## 2013-01-11 MED ORDER — HYDROCODONE-ACETAMINOPHEN 5-325 MG PO TABS: 1.0000 | ORAL_TABLET | ORAL | Status: DC | PRN

## 2013-01-11 MED ORDER — ETRAVIRINE 200 MG PO TABS: 200.0000 mg | ORAL_TABLET | Freq: Two times a day (BID) | ORAL | Status: DC

## 2013-01-11 MED ORDER — ALBUTEROL SULFATE (5 MG/ML) 0.5% IN NEBU
2.5000 mg | INHALATION_SOLUTION | RESPIRATORY_TRACT | Status: DC
  Administered 2013-01-11 – 2013-01-13 (×8): 2.5 mg via RESPIRATORY_TRACT
  Filled 2013-01-11 (×9): qty 0.5

## 2013-01-11 MED ORDER — ALBUTEROL SULFATE (5 MG/ML) 0.5% IN NEBU
2.5000 mg | INHALATION_SOLUTION | Freq: Four times a day (QID) | RESPIRATORY_TRACT | Status: DC
  Administered 2013-01-11 (×2): 2.5 mg via RESPIRATORY_TRACT
  Filled 2013-01-11: qty 0.5

## 2013-01-11 MED ORDER — MOMETASONE FURO-FORMOTEROL FUM 100-5 MCG/ACT IN AERO
2.0000 | INHALATION_SPRAY | Freq: Two times a day (BID) | RESPIRATORY_TRACT | Status: DC
  Administered 2013-01-11 – 2013-01-13 (×5): 2 via RESPIRATORY_TRACT
  Filled 2013-01-11: qty 8.8

## 2013-01-11 MED ORDER — INSULIN ASPART 100 UNIT/ML ~~LOC~~ SOLN
0.0000 [IU] | Freq: Three times a day (TID) | SUBCUTANEOUS | Status: DC
  Administered 2013-01-11: 5 [IU] via SUBCUTANEOUS
  Administered 2013-01-11: 15 [IU] via SUBCUTANEOUS

## 2013-01-11 MED ORDER — INSULIN ASPART 100 UNIT/ML ~~LOC~~ SOLN
0.0000 [IU] | Freq: Every day | SUBCUTANEOUS | Status: DC
  Administered 2013-01-11: 5 [IU] via SUBCUTANEOUS
  Administered 2013-01-12: 3 [IU] via SUBCUTANEOUS

## 2013-01-11 MED ORDER — ETRAVIRINE 100 MG PO TABS
200.0000 mg | ORAL_TABLET | Freq: Two times a day (BID) | ORAL | Status: DC
  Administered 2013-01-11 – 2013-01-13 (×4): 200 mg via ORAL
  Filled 2013-01-11 (×6): qty 2

## 2013-01-11 MED ORDER — BENZONATATE 100 MG PO CAPS
200.0000 mg | ORAL_CAPSULE | Freq: Once | ORAL | Status: AC
  Administered 2013-01-11: 200 mg via ORAL
  Filled 2013-01-11: qty 2

## 2013-01-11 MED ORDER — ALBUTEROL SULFATE (5 MG/ML) 0.5% IN NEBU: 2.5000 mg | INHALATION_SOLUTION | RESPIRATORY_TRACT | Status: DC | PRN

## 2013-01-11 MED ORDER — RITONAVIR 100 MG PO CAPS
100.0000 mg | ORAL_CAPSULE | Freq: Two times a day (BID) | ORAL | Status: DC
  Administered 2013-01-11 – 2013-01-13 (×5): 100 mg via ORAL
  Filled 2013-01-11 (×7): qty 1
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
## 2013-01-11 NOTE — ED Notes (Signed)
The patient keeps removing his mask despite me advising that it is important.  I further advised that failure to wear the mask could cause an immunocompromised patient to get very sick or to die.  The patient said, "I don't care.  I will not wear it over my nose."

## 2013-01-11 NOTE — Progress Notes (Signed)
Utilization Review Completed Bani Gianfrancesco J. Dametri Ozburn, RN, BSN, NCM [phone]  

## 2013-01-11 NOTE — Progress Notes (Signed)
Chart reviewed. Patient examined. Does not feel well enough to home. Still dyspneic on exertion. Applying for disability. Still smokes cigarettes. Blood sugars high on steroids. Will give insulin therapy and check hemoglobin A1c. Discontinue telemetry. Change steroids to by mouth. Increase activity. Add Levaquin for acute bronchitis.

## 2013-01-11 NOTE — Care Management Note (Addendum)
  Page 2 of 2   01/13/2013     2:26:54 PM   CARE MANAGEMENT NOTE 01/13/2013  Patient:  Chris [name], Chris [name]   Account Number:  [id number]  Date Initiated:  01/11/2013  Documentation initiated by:  Oletta Cohn  Subjective/Objective Assessment:   47 year old African American male with history of COPD, emphysema, nicotine abuse, HIV, follows infectious ds clinic (Dr Ilsa Iha) presented to ED with 2-3 days of worsening shortness of breath coughing and wheezing.     Action/Plan:   scheduled albuterol/Atrovent nebulizer treatments, IV Solu-Medrol, Dulera, O2 supplementation  - Case management consult for medication needs and establishing PCP   Anticipated DC Date:  01/11/2013   Anticipated DC Plan:  HOME W HOME HEALTH SERVICES  In-house referral  Financial Counselor  PCP / Health Connect      DC Planning Services  CM consult  MATCH Program      [facility] Choice  HOME HEALTH   Choice offered to / List presented to:  C-1 Patient   DME arranged  OXYGEN      DME agency  [facility]     [facility] arranged  HH-1 RN  HH-10 DISEASE MANAGEMENT      HH agency  [facility]   Status of service:  Completed, signed off Medicare Important Message given?   (If response is "NO", the following Medicare IM given date fields will be blank) Date Medicare IM given:   Date Additional Medicare IM given:    Discharge Disposition:    Per UR Regulation:    If discussed at Long Length of Stay Meetings, dates discussed:    Comments:  01/13/2013.Marland KitchenMarland KitchenOletta Cohn, RN,BSN, [place] [phone] NCM set up [facility] program for pt to fill prescriptions until Medicaid comes through.  Pt also quailfies for home O2. Per [facility] rep Montez Morita), pt received charity O2 equiment in 2011 and has not returned equipment.  NCM asked pt about equiment and pt states that equipment isn't in his possession at this time; it is on [address] in [place], [place] and he can ask his brother to bring it to him in [place].  NCM advised  pt to do that ASAP.  01/11/13...1000.Marland KitchenMarland KitchenOletta Cohn, RN, BSN, [place]  [phone] Spoke with patient and wife at bedside concerning discharge planning.  Offered pt list of Home Health Agencies for purposes of disease management.  Pt chose [facility] to render services.  Kizzie Furnish aware and will follow up with pt; as pt is self pay and will have to qualify for services or be billed.  CM offered Family [facility] at [place]; [facility] and My [facility] Referral phone numbers for pt to choose PCP.  CM to continue to follow pt until d/c home.

## 2013-01-12 DIAGNOSIS — J9611 Chronic respiratory failure with hypoxia: Secondary | ICD-10-CM

## 2013-01-12 DIAGNOSIS — R739 Hyperglycemia, unspecified: Secondary | ICD-10-CM

## 2013-01-12 DIAGNOSIS — T380X5A Adverse effect of glucocorticoids and synthetic analogues, initial encounter: Secondary | ICD-10-CM

## 2013-01-12 LAB — BASIC METABOLIC PANEL
BUN: 18 mg/dL (ref 6–23)
BUN: 17 mg/dL (ref 6–23)
CO2: 33 mEq/L — ABNORMAL HIGH (ref 19–32)
Calcium: 9.8 mg/dL (ref 8.4–10.5)
Calcium: 9.3 mg/dL (ref 8.4–10.5)
GFR calc Af Amer: >90 mL/min (ref >90)
GFR calc non Af Amer: >90 mL/min (ref >90)
GFR calc non Af Amer: >90 mL/min (ref >90)
Glucose, Bld: 357 mg/dL — ABNORMAL HIGH (ref 70–99)
Glucose, Bld: 410 mg/dL — ABNORMAL HIGH (ref 70–99)
Potassium: 5.1 mEq/L (ref 3.5–5.1)
Sodium: 135 mEq/L (ref 135–145)
Sodium: 131 mEq/L — ABNORMAL LOW (ref 135–145)

## 2013-01-12 LAB — GLUCOSE, CAPILLARY: Glucose-Capillary: 354 mg/dL — ABNORMAL HIGH (ref 70–99)

## 2013-01-12 LAB — HEMOGLOBIN A1C: Mean Plasma Glucose: 131 mg/dL — ABNORMAL HIGH (ref <117)

## 2013-01-12 MED ORDER — PREDNISONE 50 MG PO TABS
60.0000 mg | ORAL_TABLET | Freq: Every day | ORAL | Status: DC
  Administered 2013-01-13: 60 mg via ORAL
  Filled 2013-01-12: qty 1

## 2013-01-12 MED ORDER — INSULIN ASPART 100 UNIT/ML ~~LOC~~ SOLN
5.0000 [IU] | Freq: Once | SUBCUTANEOUS | Status: AC
  Administered 2013-01-12: 5 [IU] via SUBCUTANEOUS

## 2013-01-12 MED ORDER — INSULIN DETEMIR 100 UNIT/ML ~~LOC~~ SOLN
10.0000 [IU] | Freq: Every day | SUBCUTANEOUS | Status: DC
  Administered 2013-01-12 – 2013-01-13 (×2): 10 [IU] via SUBCUTANEOUS
  Filled 2013-01-12 (×2): qty 0.1

## 2013-01-12 MED ORDER — INSULIN ASPART 100 UNIT/ML ~~LOC~~ SOLN
5.0000 [IU] | Freq: Three times a day (TID) | SUBCUTANEOUS | Status: DC
  Administered 2013-01-12 – 2013-01-13 (×3): 5 [IU] via SUBCUTANEOUS

## 2013-01-12 NOTE — Progress Notes (Signed)
Pt cbg is 411.  Text/paged Dr. Malachi Bonds.

## 2013-01-12 NOTE — Progress Notes (Signed)
[facility] PROGRESS NOTE  Mckade Gurka ZOX:[id number] DOB: 02-07-1966 DOA: 01/10/2013 PCP: No PCP Per Patient  Assessment/Plan  Acute COPD exacerbation with acute hypoxic respiratory failure -  Counseled tobacco cessation -  Continue steroid taper -  Continue nebulizer treatment -  Continue Dulera -  Wean oxygen as tolerated -  Continue levofloxacin -  If able to wean to room air today, check pulse ox with ambulation  HIV, stable.  Last CD4 count of 100 on 12/06/2012. Continue HAART. Continue dapsone and azithromycin  Steroid-induced hyperglycemia with last fingersticks greater than 400 -  Stat BMP -  Add aspart 5 units in addition to sliding-scale insulin with meals -  Increase levemir -  Will need diabetic education so we'll consult diabetic educator -  Nursing staff to assist with teaching -  Hemoglobin A1c with morning labs  Diet:  Diabetic Access:  PIV IVF:  None Proph:  Lovenox  Code Status: Full Family Communication: Spoke with patient and his wife Disposition Plan: Possibly tomorrow is off of oxygen and fingersticks are stable   Consultants:  None  Procedures:  Chest x-ray  Antibiotics:  Levofloxacin from June 3    HPI/Subjective:  Patient states that he feels much better and would like to go home. He states that he is not currently on home oxygen.  He states that he is breathing better and has been able to sit up and eat his dinner without shortness of breath.  Denies diarrhea or constipation.   Objective: Filed Vitals:   01/11/13 2213 01/12/13 0510 01/12/13 0543 01/12/13 0825  BP:  152/79    Pulse:  102    Temp:  97.6 F (36.4 C)    TempSrc:  Oral    Resp:  22    Height:      Weight:  118.389 kg (261 lb)    SpO2: 96% 93% 95% 92%    Intake/Output Summary (Last 24 hours) at 01/12/13 1214 Last data filed at 01/12/13 1044  Gross per 24 hour  Intake    920 ml  Output   2975 ml  Net  -2055 ml   Filed Weights   01/11/13 0522 01/12/13  0510  Weight: 117.9 kg (259 lb 14.8 oz) 118.389 kg (261 lb)    Exam:   General:  The African American male, No acute distress  HEENT:  NCAT, MMM  Cardiovascular:  RRR, nl S1, S2 no mrg, 2+ pulses, warm extremities  Respiratory:  Diminished bilateral breath sounds with faint respiratory sounds heard at the apices, very high pitched expiratory wheeze at the right apex, no rales or rhonchi, no increased WOB  Abdomen:  NABS, soft, NT/ND  MSK:   Normal tone and bulk, no LEE  Neuro:  Grossly intact  Data Reviewed: Basic Metabolic Panel:  Recent Labs Lab 01/10/13 2312 01/10/13 2319 01/11/13 0710 01/12/13 0531  NA 138 142 130* 135  K 4.1 4.1 5.5* 5.1  CL 100 104 97 98  CO2 28  --  26 33*  GLUCOSE 104* 101* 334* 357*  BUN 17 18 18 18   CREATININE 0.79 0.80 0.72 0.76  CALCIUM 9.3  --  9.0 9.8   Liver Function Tests:  Recent Labs Lab 01/10/13 2312  AST 31  ALT 33  ALKPHOS 52  BILITOT 0.3  PROT 8.9*  ALBUMIN 3.8   No results found for this basename: LIPASE, AMYLASE,  in the last 168 hours No results found for this basename: AMMONIA,  in the last 168 hours  CBC:  Recent Labs Lab 01/10/13 2312 01/10/13 2319 01/11/13 0710  WBC 9.5  --  7.5  NEUTROABS 5.9  --   --   HGB 13.8 15.6 13.3  HCT 44.0 46.0 41.7  MCV 91.5  --  91.0  PLT 185  --  160   Cardiac Enzymes: No results found for this basename: CKTOTAL, CKMB, CKMBINDEX, TROPONINI,  in the last 168 hours BNP (last 3 results) No results found for this basename: PROBNP,  in the last 8760 hours CBG:  Recent Labs Lab 01/11/13 1132 01/11/13 1621 01/11/13 2106 01/12/13 0624 01/12/13 1105  GLUCAP 247* 222* 382* 354* 411*    No results found for this or any previous visit (from the past 240 hour(s)).   Studies: Dg Chest 2 View  01/10/2013   *RADIOLOGY REPORT*  Clinical Data: Shortness of breath.  CHEST - 2 VIEW  Comparison: Chest x-ray 12/14/2012.  Findings: Lungs appear mildly hyperexpanded.  Mild diffuse  peribronchial cuffing.  No acute consolidative airspace disease. No pleural effusions.  Pulmonary vasculature and the cardiomediastinal silhouette are within normal limits. No pneumothorax.  No suspicious appearing pulmonary nodules or masses.  IMPRESSION: 1.  Mild hyperexpansion with peribronchial cuffing.  Findings are similar to the prior examination, and may reflect reactive airway disease.   Original Report Authenticated By: Trudie Reed, M.D.    Scheduled Meds: . albuterol  2.5 mg Nebulization Q4H while awake  . [START ON 01/13/2013] azithromycin  600 mg Oral 2 times weekly  . chlorpheniramine-HYDROcodone  5 mL Oral Q12H  . dapsone  100 mg Oral Daily  . darunavir  600 mg Oral BID WC  . enoxaparin (LOVENOX) injection  40 mg Subcutaneous Daily  . etravirine  200 mg Oral BID WC  . insulin aspart  0-20 Units Subcutaneous TID WC  . insulin aspart  0-5 Units Subcutaneous QHS  . insulin aspart  5 Units Subcutaneous TID WC  . insulin detemir  10 Units Subcutaneous Daily  . ipratropium  0.5 mg Nebulization Q4H while awake  . levofloxacin  500 mg Oral q1800  . mometasone-formoterol  2 puff Inhalation BID  . nicotine  21 mg Transdermal Daily  . predniSONE  60 mg Oral BID  . raltegravir  400 mg Oral BID  . ritonavir  100 mg Oral BID WC  . sodium chloride  3 mL Intravenous Q12H   Continuous Infusions:   Principal Problem:   COPD exacerbation Active Problems:   HIV disease   Nicotine abuse    Time spent: 30 min    Delonte Musich, [facility]  [facility] Pager [phone]. If 7PM-7AM, please contact night-coverage at [email or web address], password [facility] 01/12/2013, 12:14 PM  LOS: 2 days

## 2013-01-13 DIAGNOSIS — R7309 Other abnormal glucose: Secondary | ICD-10-CM

## 2013-01-13 DIAGNOSIS — J9600 Acute respiratory failure, unspecified whether with hypoxia or hypercapnia: Secondary | ICD-10-CM

## 2013-01-13 LAB — BASIC METABOLIC PANEL
BUN: 19 mg/dL (ref 6–23)
Chloride: 96 mEq/L (ref 96–112)
Creatinine, Ser: 0.75 mg/dL (ref 0.50–1.35)
Glucose, Bld: 194 mg/dL — ABNORMAL HIGH (ref 70–99)
Potassium: 4.5 mEq/L (ref 3.5–5.1)

## 2013-01-13 LAB — GLUCOSE, CAPILLARY: Glucose-Capillary: 187 mg/dL — ABNORMAL HIGH (ref 70–99)

## 2013-01-13 MED ORDER — PREDNISONE 10 MG PO TABS: ORAL_TABLET | ORAL | Status: DC

## 2013-01-13 MED ORDER — INSULIN ASPART 100 UNIT/ML ~~LOC~~ SOLN: 0.0000 [IU] | Freq: Three times a day (TID) | SUBCUTANEOUS | Status: DC

## 2013-01-13 MED ORDER — BD GETTING STARTED TAKE HOME KIT: 3/10ML X 30G SYRINGES
1.0000 | Freq: Once | Status: AC
  Administered 2013-01-13: 1
  Filled 2013-01-13: qty 1

## 2013-01-13 MED ORDER — ALBUTEROL SULFATE (5 MG/ML) 0.5% IN NEBU: 2.5000 mg | INHALATION_SOLUTION | Freq: Four times a day (QID) | RESPIRATORY_TRACT | Status: DC | PRN

## 2013-01-13 MED ORDER — "INSULIN SYRINGE 30G X 1/2"" 0.5 ML MISC": 1.0000 | Freq: Three times a day (TID) | Status: DC

## 2013-01-13 MED ORDER — NICOTINE 21 MG/24HR TD PT24: 1.0000 | MEDICATED_PATCH | Freq: Every day | TRANSDERMAL | Status: DC

## 2013-01-13 NOTE — Progress Notes (Addendum)
Inpatient Diabetes Program Recommendations  AACE/[name]: New Consensus Statement on Inpatient Glycemic Control (2013)  Target Ranges:  Prepandial:   less than 140 mg/dL      Peak postprandial:   less than 180 mg/dL (1-2 hours)      Critically ill patients:  140 - 180 mg/dL     Results for AKIO, HUDNALL (MRN [id number]) as of 01/13/2013 11:59  Ref. Range 01/13/2013 05:59 01/13/2013 11:08  Glucose-Capillary Latest Range: 70-99 mg/dL 409 (H) 811 (H)    CBGs much improved since IV Solumedrol stopped.  Patient now getting PO Prednisone and will be d/c'd home on Prednisone taper.  Noted A1c is 6.2%.  Patient is likely pre-diabetic and should be followed by PCP after d/c for close monitoring.  Spoke with Dr Malachi Bonds regarding d/c plan.  Dr. Malachi Bonds would like to send patient home on Novolog SSI regimen to cover elevated CBGs while patient is on steroid taper.  Once steroids are completed, patient will stop the insulin.  Called care management to confirm that patient will have access to the [facility] program for d/c.  Care management has given patient names of local physicians to follow up with for PCP services.  Patient will also be able to get Novolog insulin through the [facility] program at d/c.  Dr. Malachi Bonds- Please give patient a Rx for Novolog at d/c (Patient can get Novolog through the [facility] program)  Would use the hospital Moderate SSI regimen for this patient while he is on Prednisone  <120 mg/dl  0 units 914-782       2 units 151-200       3 units 201-250       5 units 251-300       8 units 301-350      11 units 351-400      15 units   Will follow. Ambrose Finland RN, MSN, CDE Diabetes Coordinator Inpatient Diabetes Program [phone]   Addendum 1446:  Spoke with patient and wife about how to take insulin at home.  Patient to be discharged home on Prednisone taper and will take Novolog insulin injections tid with meals at home while he takes the Prednisone.  RN Tiffany assisted patient with  insulin injections and taught patient how to self administer insulin.  I also assisted patient in learning insulin for home.  Educated patient how to read insulin syringe, how to add air to the insulin vial, and how to pull back the appropriate amount of insulin.  Also reminded patient to dispose of needles and sharps in a sturdy plastic container and to place in trash once full.  Also educated patient and spouse how to use a SSI at home.  Copy of the prescribed SSi regimen given to patient for use at home.  Gave patient a brochure on diabetes.  Instructed patient that he is at risk for diabetes (ethnicity, family history, obesity).  Encouraged patient to get established with a PCP so he can get regular care for his COPD and pre-diabetes.  Patient was able to provide successful return demonstration with insulin injection.

## 2013-01-13 NOTE — Progress Notes (Signed)
Educated pt about how to give himself insulin injections.  Pt verbalized understanding, then gave himself his insulin injection for lunch time.  Diabetes coordinator currently at bedside doing teaching as well.

## 2013-01-13 NOTE — Discharge Summary (Signed)
Physician Discharge Summary  Chris [name] WUJ:[id number] DOB: 05-26-1966 DOA: 01/10/2013  PCP: No PCP Per Patient  Admit date: 01/10/2013 Discharge date: 01/13/2013  Recommendations for Outpatient Follow-up:  1. Followup with his primary care doctor within one week of discharge. He will need outpatient pulmonary function tests once he has recovered from his acute illness.   2. Case manager provided the patient with information about local primary care doctor's offices. The patient called one of the local offices to schedule an appointment prior to discharge. 3. Followup at regularly scheduled appointment with the infectious disease clinic.  Discharge Diagnoses:  Principal Problem:   COPD exacerbation Active Problems:   HIV disease   Nicotine abuse   Steroid-induced hyperglycemia   Acute respiratory failure with hypoxia   Discharge Condition: Stable, improved, fair condition  Diet recommendation: Diabetic  Wt Readings from Last 3 Encounters:  01/13/13 118.207 kg (260 lb 9.6 oz)  12/23/12 121.11 kg (267 lb)    History of present illness:  Patient is a 47 year old African American male with history of COPD, emphysema, nicotine abuse, HIV, follows infectious ds clinic (Dr Ilsa Iha) presented to ED with 2-3 days of worsening shortness of breath coughing and wheezing. Patient reports that he is relocated to [place] 3 months ago, has not established primary care here yet. Patient states that he ran out of his albuterol and ipratropium solution for his nebulizer machine. He has been coughing with clear phlegm, no fevers or chills. In the ER patient was noted to have significant wheezing which did improve some with breathing treatments. Chest x-ray did not show any pneumonia.  Hospital Course:  Acute COPD exacerbation with acute hypoxic respiratory failure.  The patient was admitted and started on IV steroids. He was quickly transitioned to oral prednisone. He he will need to complete a long  steroid taper. He was counseled to stop smoking, particularly because he is to be discharged on home oxygen. He was advised of the risk of burn injury or event that if he smokes while taking his oxygen. He should continue his Advair and Spiriva. He is given a prescription for nebulizer albuterol which he can use as needed. He received levofloxacin while in the hospital but does not need to continue as an outpatient.    HIV, stable. Last CD4 count of 100 on 12/06/2012. Continue HAART. Continue dapsone and azithromycin   Steroid-induced hyperglycemia with last fingersticks greater than 400.  He was started on levemir and sliding scale insulin.  His fingersticks are slowly trended down to combination of insulin and tapering his steroids. He will need to use sliding scale insulin moderate coverage when he gets home. That is 0 to 15 units based on his fingerstick. He was seen by the diabetic educator and by the diabetic educator and the nursing staff reviewed how to check fingersticks and administer insulin. The patient demonstrated ability to do both of these tasks.  He is going to get a glucometer from [employer].    Lab Results  Component Value Date   HGBA1C 6.2* 01/12/2013    Consultants:  None Procedures:  Chest x-ray Antibiotics:  Levofloxacin from June 3 to June 5th.  Will discontinue as patient is also on azithromycin and dapsone.   Discharge Exam: Filed Vitals:   01/13/13 0603  BP: 145/91  Pulse: 83  Temp: 97.2 F (36.2 C)  Resp: 20   Filed Vitals:   01/13/13 0603 01/13/13 0809 01/13/13 0843 01/13/13 1150  BP: 145/91     Pulse: 83  Temp: 97.2 F (36.2 C)     TempSrc: Oral     Resp: 20     Height:      Weight: 118.207 kg (260 lb 9.6 oz)     SpO2: 93% 92% 84% 91%   The patient states that he wants to go home. He states that he does not feel Chris [name] of breath if he is resting or if he has a small amount of exertion. He feels comfortable and has oxygen on and the nebulizer  treatments continued to help his breathing.  He has been eating okay and states that he fully understands how to use a glucometer and administer insulin.  I counseled him that he could benefit from staying in the hospital for an additional day for further breathing treatments and to make sure that he continues to have improvement in his wheezing. The patient declines  General: The African American male, No acute distress  HEENT: NCAT, MMM  Cardiovascular: RRR, nl S1, S2 no mrg, 2+ pulses, warm extremities  Respiratory: Diminished bilateral breath sounds with faint respiratory sounds heard at the apices, very high pitched expiratory wheeze at the bilateral apices, no rales or rhonchi, no increased WOB.  Prolonged inspiratory to expiratory phase Abdomen: NABS, soft, NT/ND  MSK: Normal tone and bulk, no LEE  Neuro: Grossly intact   Discharge Instructions      Discharge Orders   Future Appointments Provider Department Dept Phone   02/03/2013 8:45 AM Judyann Munson, MD [facility] [phone]   Future Orders Complete By Expires     Call MD for:  difficulty breathing, headache or visual disturbances  As directed     Call MD for:  extreme fatigue  As directed     Call MD for:  hives  As directed     Call MD for:  persistant dizziness or light-headedness  As directed     Call MD for:  persistant nausea and vomiting  As directed     Call MD for:  severe uncontrolled pain  As directed     Call MD for:  temperature >100.4  As directed     Diet Carb Modified  As directed     Discharge instructions  As directed     Comments:      You were hospitalized with a COPD exacerbation. Please stop smoking. Please make sure that you take your advair and spiriva every day.  You may use albuterol nebulizer treatments as needed to treat shortness of breath or wheezing.  Please wear your oxygen all the time and do NOT smoke while wearing oxygen.  Continue a long prednisone  taper.  If you become more Chris [name] of breath, please return immediately to the emergency department.  For your high blood sugars, please check your fingersticks before breakfast, lunch, and dinner and administer insulin based on the sliding scale provided.  Please contact your doctor if you have fingersticks > 350 or < 70.  Please read through the instructions about diabetes and low blood sugars carefully.  Follow up with a primary care doctor within 1 week of discharge and make sure you bring all of your information with you to the appointment so they know what to follow up on.    Increase activity slowly  As directed         Medication List    TAKE these medications       albuterol 108 (90 BASE) MCG/ACT inhaler  Commonly known as:  PROVENTIL HFA;VENTOLIN HFA  Inhale 2 puffs into the lungs every 6 (six) hours as needed for wheezing.     albuterol (5 MG/ML) 0.5% nebulizer solution  Commonly known as:  PROVENTIL  Take 0.5 mLs (2.5 mg total) by nebulization every 6 (six) hours as needed for wheezing or shortness of breath.     azithromycin 600 MG tablet  Commonly known as:  ZITHROMAX  Take 1 tablet (600 mg total) by mouth 2 (two) times a week. Take 1 tablet by mouth every Monday and Friday.     dapsone 100 MG tablet  Take 1 tablet (100 mg total) by mouth daily.     darunavir 600 MG tablet  Commonly known as:  PREZISTA  Take 1 tablet (600 mg total) by mouth 2 (two) times daily.     Etravirine 200 MG Tabs  Take 1 tablet by mouth 2 (two) times daily.     Fluticasone-Salmeterol 250-50 MCG/DOSE Aepb  Commonly known as:  ADVAIR  Inhale 1 puff into the lungs every 12 (twelve) hours.     insulin aspart 100 UNIT/ML injection  Commonly known as:  novoLOG  Inject 0-15 Units into the skin 3 (three) times daily with meals. Sliding scale     INSULIN SYRINGE .5CC/30GX1/2" 30G X 1/2" 0.5 ML Misc  1 each by Does not apply route 3 (three) times daily before meals.     nicotine 21 mg/24hr patch   Commonly known as:  NICODERM CQ - dosed in mg/24 hours  Place 1 patch onto the skin daily.     predniSONE 10 MG tablet  Commonly known as:  DELTASONE  Take 6 tabs x 2 days, 5 tabs x 2 days, 4 tabs x 2 days, 3 tabs x 2 days, 2 tabs x 2 days, 1 tab x 2 days, then stop     raltegravir 400 MG tablet  Commonly known as:  ISENTRESS  Take 1 tablet (400 mg total) by mouth 2 (two) times daily.     ritonavir 100 MG capsule  Commonly known as:  NORVIR  Take 1 capsule (100 mg total) by mouth 2 (two) times daily.     tiotropium 18 MCG inhalation capsule  Commonly known as:  SPIRIVA  Place 1 capsule (18 mcg total) into inhaler and inhale daily.       Follow-up Information   Follow up with No PCP Per Patient.   Contact information:   [address] [place] [address] [phone]        The results of significant diagnostics from this hospitalization (including imaging, microbiology, ancillary and laboratory) are listed below for reference.    Significant Diagnostic Studies: Dg Chest 2 View  01/10/2013   *RADIOLOGY REPORT*  Clinical Data: Shortness of breath.  CHEST - 2 VIEW  Comparison: Chest x-ray 12/14/2012.  Findings: Lungs appear mildly hyperexpanded.  Mild diffuse peribronchial cuffing.  No acute consolidative airspace disease. No pleural effusions.  Pulmonary vasculature and the cardiomediastinal silhouette are within normal limits. No pneumothorax.  No suspicious appearing pulmonary nodules or masses.  IMPRESSION: 1.  Mild hyperexpansion with peribronchial cuffing.  Findings are similar to the prior examination, and may reflect reactive airway disease.   Original Report Authenticated By: Trudie Reed, M.D.    Microbiology: No results found for this or any previous visit (from the past 240 hour(s)).   Labs: Basic Metabolic Panel:  Recent Labs Lab 01/10/13 2312 01/10/13 2319 01/11/13 0710 01/12/13 0531 01/12/13 1145 01/13/13 0510  NA 138  142 130* 135 131* 134*  K  4.1 4.1 5.5* 5.1 5.1 4.5  CL 100 104 97 98 95* 96  CO2 28  --  26 33* 28 32  GLUCOSE 104* 101* 334* 357* 410* 194*  BUN 17 18 18 18 17 19   CREATININE 0.79 0.80 0.72 0.76 0.64 0.75  CALCIUM 9.3  --  9.0 9.8 9.3 9.3   Liver Function Tests:  Recent Labs Lab 01/10/13 2312  AST 31  ALT 33  ALKPHOS 52  BILITOT 0.3  PROT 8.9*  ALBUMIN 3.8   No results found for this basename: LIPASE, AMYLASE,  in the last 168 hours No results found for this basename: AMMONIA,  in the last 168 hours CBC:  Recent Labs Lab 01/10/13 2312 01/10/13 2319 01/11/13 0710  WBC 9.5  --  7.5  NEUTROABS 5.9  --   --   HGB 13.8 15.6 13.3  HCT 44.0 46.0 41.7  MCV 91.5  --  91.0  PLT 185  --  160   Cardiac Enzymes: No results found for this basename: CKTOTAL, CKMB, CKMBINDEX, TROPONINI,  in the last 168 hours BNP: BNP (last 3 results) No results found for this basename: PROBNP,  in the last 8760 hours CBG:  Recent Labs Lab 01/12/13 1105 01/12/13 1607 01/12/13 2022 01/13/13 0559 01/13/13 1108  GLUCAP 411* 308* 274* 187* 174*    Time coordinating discharge: 45 minutes  Signed:  Gilberto Stanforth  [facility] 01/13/2013, 1:47 PM

## 2013-01-13 NOTE — Progress Notes (Signed)
RT entered  room, pt's O2 off and sat 84%. Placed [place] back on pt at 2L, sat now 92%. RN notified.

## 2013-01-13 NOTE — Progress Notes (Signed)
Pt resting o2 sats 84% on room air.  Pt placed back on 2L oxygen, o2 sats now 94%.

## 2013-01-14 ENCOUNTER — Encounter: Payer: Self-pay | Admitting: Internal Medicine

## 2013-01-27 ENCOUNTER — Encounter: Payer: Self-pay | Admitting: Internal Medicine

## 2013-01-27 ENCOUNTER — Ambulatory Visit: Payer: No Typology Code available for payment source | Attending: Family Medicine | Admitting: Internal Medicine

## 2013-01-27 VITALS — BP 139/67 | HR 97 | Temp 98.8°F | Resp 20 | Ht 68.0 in | Wt 257.0 lb

## 2013-01-27 DIAGNOSIS — J441 Chronic obstructive pulmonary disease with (acute) exacerbation: Secondary | ICD-10-CM

## 2013-01-27 DIAGNOSIS — Z794 Long term (current) use of insulin: Secondary | ICD-10-CM | POA: Insufficient documentation

## 2013-01-27 DIAGNOSIS — E785 Hyperlipidemia, unspecified: Secondary | ICD-10-CM | POA: Insufficient documentation

## 2013-01-27 DIAGNOSIS — B20 Human immunodeficiency virus [HIV] disease: Secondary | ICD-10-CM | POA: Insufficient documentation

## 2013-01-27 DIAGNOSIS — E119 Type 2 diabetes mellitus without complications: Secondary | ICD-10-CM | POA: Insufficient documentation

## 2013-01-27 DIAGNOSIS — J438 Other emphysema: Secondary | ICD-10-CM | POA: Insufficient documentation

## 2013-01-27 MED ORDER — GLUCOSE BLOOD VI STRP: ORAL_STRIP | Status: DC

## 2013-01-27 MED ORDER — ALCOHOL SWABS PADS: 1.0000 | MEDICATED_PAD | Status: DC | PRN

## 2013-01-27 NOTE — Progress Notes (Signed)
Patient ID: Chris [name], male   DOB: 1965/11/14, 47 y.o.   MRN: [id number]  CC: All up after recent hospitalization  HPI: 47 year old African American male with past medical history of COPD on home oxygen, emphysema, nicotine abuse, HIV on hAART, recent hospitalization for COPD exacerbation who presented to our clinic for followup. Patient continues to experience shortness of breath and does have home oxygen pump with him. He does report he is able to walk but not well distances as he becomes very short of breath. He does not have audible wheezes at this time. He uses a nebulizer machine every 8 hours as needed since hospitalization. Patient reports no chest pain or palpitations.   Allergies  Allergen Reactions  . Bactrim (Sulfamethoxazole-Tmp Ds) Hives   Past Medical History  Diagnosis Date  . COPD (chronic obstructive pulmonary disease)   . HIV disease   . Emphysema    Current Outpatient Prescriptions on File Prior to Visit  Medication Sig Dispense Refill  . albuterol (PROVENTIL HFA;VENTOLIN HFA) 108 (90 BASE) MCG/ACT inhaler Inhale 2 puffs into the lungs every 6 (six) hours as needed for wheezing.  1 Inhaler  6  . albuterol (PROVENTIL) (5 MG/ML) 0.5% nebulizer solution Take 0.5 mLs (2.5 mg total) by nebulization every 6 (six) hours as needed for wheezing or shortness of breath.  20 mL  11  . azithromycin (ZITHROMAX) 600 MG tablet Take 1 tablet (600 mg total) by mouth 2 (two) times a week. Take 1 tablet by mouth every Monday and Friday.  10 tablet  2  . dapsone 100 MG tablet Take 1 tablet (100 mg total) by mouth daily.  30 tablet  11  . darunavir (PREZISTA) 600 MG tablet Take 1 tablet (600 mg total) by mouth 2 (two) times daily.  60 tablet  11  . Etravirine 200 MG TABS Take 1 tablet by mouth 2 (two) times daily.  60 tablet  11  . Fluticasone-Salmeterol (ADVAIR) 250-50 MCG/DOSE AEPB Inhale 1 puff into the lungs every 12 (twelve) hours.      . insulin aspart (NOVOLOG) 100 UNIT/ML injection  Inject 0-15 Units into the skin 3 (three) times daily with meals. Sliding scale  1 vial  12  . Insulin Syringe-Needle U-100 (INSULIN SYRINGE .5CC/30GX1/2") 30G X 1/2" 0.5 ML MISC 1 each by Does not apply route 3 (three) times daily before meals.  90 each  0  . raltegravir (ISENTRESS) 400 MG tablet Take 1 tablet (400 mg total) by mouth 2 (two) times daily.  60 tablet  11  . ritonavir (NORVIR) 100 MG capsule Take 1 capsule (100 mg total) by mouth 2 (two) times daily.  60 capsule  11  . tiotropium (SPIRIVA) 18 MCG inhalation capsule Place 1 capsule (18 mcg total) into inhaler and inhale daily.  30 capsule  11  . nicotine (NICODERM CQ - DOSED IN MG/24 HOURS) 21 mg/24hr patch Place 1 patch onto the skin daily.  28 patch  0   No current facility-administered medications on file prior to visit.   Family medical history significant for HTN, HLD  History   Social History  . Marital Status: Married    Spouse Name: N/A    Number of Children: N/A  . Years of Education: N/A   Occupational History  . Not on file.   Social History Main Topics  . Smoking status: Current Every Day Smoker    Types: Cigarettes  . Smokeless tobacco: Never Used  . Alcohol Use: No  .  Drug Use: No  . Sexually Active: Yes    Birth Control/ Protection: Condom   Other Topics Concern  . Not on file   Social History Narrative  . No narrative on file    Review of Systems  Constitutional: Negative for fever, chills, diaphoresis, activity change, appetite change and fatigue.  HENT: Negative for ear pain, nosebleeds, congestion, facial swelling, rhinorrhea, neck pain, neck stiffness and ear discharge.   Eyes: Negative for pain, discharge, redness, itching and visual disturbance.  Respiratory: Positive for shortness of breath, chronic cough, no sputum production.   Cardiovascular: Negative for chest pain, palpitations and leg swelling.  Gastrointestinal: Negative for abdominal distention.  Genitourinary: Negative for  dysuria, urgency, frequency, hematuria, flank pain, decreased urine volume, difficulty urinating and dyspareunia.  Musculoskeletal: Negative for back pain, joint swelling, arthralgias and gait problem.  Neurological: Negative for dizziness, tremors, seizures, syncope, facial asymmetry, speech difficulty, weakness, light-headedness, numbness and headaches.  Hematological: Negative for adenopathy. Does not bruise/bleed easily.  Psychiatric/Behavioral: Negative for hallucinations, behavioral problems, confusion, dysphoric mood, decreased concentration and agitation.    Objective:   Filed Vitals:   01/27/13 1057  BP: 139/67  Pulse: 97  Temp: 98.8 F (37.1 C)  Resp: 20    Physical Exam  Constitutional: Appears well-developed and well-nourished. No distress.  HENT: Normocephalic. External right and left ear normal. Oropharynx is clear and moist.  Eyes: Conjunctivae and EOM are normal. PERRLA, no scleral icterus.  Neck: Normal ROM. Neck supple. No JVD. No tracheal deviation. No thyromegaly.  CVS: RRR, S1/S2 +, no murmurs, no gallops, no carotid bruit.  Pulmonary: Effort and breath sounds normal, no stridor, rhonchi, mild wheezing and upper lung lobes wheezes, no rales.  Abdominal: Soft. BS +,  no distension, tenderness, rebound or guarding.  Musculoskeletal: Normal range of motion. No edema and no tenderness.  Lymphadenopathy: No lymphadenopathy noted, cervical, inguinal. Neuro: Alert. Normal reflexes, muscle tone coordination. No cranial nerve deficit. Skin: Skin is warm and dry. No rash noted. Not diaphoretic. No erythema. No pallor.  Psychiatric: Normal mood and affect. Behavior, judgment, thought content normal.   Lab Results  Component Value Date   WBC 7.5 01/11/2013   HGB 13.3 01/11/2013   HCT 41.7 01/11/2013   MCV 91.0 01/11/2013   PLT 160 01/11/2013   Lab Results  Component Value Date   CREATININE 0.75 01/13/2013   BUN 19 01/13/2013   NA 134* 01/13/2013   K 4.5 01/13/2013   CL 96  01/13/2013   CO2 32 01/13/2013    Lab Results  Component Value Date   HGBA1C 6.2* 01/12/2013   Lipid Panel     Component Value Date/Time   CHOL 237* 12/06/2012 1037   TRIG 181* 12/06/2012 1037   HDL 32* 12/06/2012 1037   CHOLHDL 7.4 12/06/2012 1037   VLDL 36 12/06/2012 1037   LDLCALC 169* 12/06/2012 1037       Assessment and plan:   Patient Active Problem List   Diagnosis Date Noted  .  COPD, emphysema  - Continue home oxygen  - Continue nebulizer treatments as needed. Continue Advair and Spiriva scheduled  - If her roommates to pulmonary clinic for pulmonary function tests 01/12/2013  .  Diabetes  - Continue current insulin regimen is a sliding scale NovoLog  - A1c 01/12/2013 is a 6.2 indicating good glycemic control  01/12/2013     Dyslipidemia  - We will obtain lipid panel on the next visit. Fasting lipid panel    . HIV  on HAART - Continue current medications, Prezista, Etravirine, Isentress and Norvir - Azithromycin for MAC prophylaxis - Dapsone for PCP prophylaxis  01/11/2013

## 2013-01-27 NOTE — Progress Notes (Signed)
Presents for Hospital follow up for COPD; states he is HIV positive.

## 2013-01-27 NOTE — Patient Instructions (Signed)
Blood Sugar Monitoring, Adult  GLUCOSE METERS FOR SELF-MONITORING OF BLOOD GLUCOSE   It is important to be able to correctly measure your blood sugar (glucose). You can use a blood glucose monitor (a small battery-operated device) to check your glucose level at any time. This allows you and your caregiver to monitor your diabetes and to determine how well your treatment plan is working. The process of monitoring your blood glucose with a glucose meter is called self-monitoring of blood glucose (SMBG). When people with diabetes control their blood sugar, they have better health.  To test for glucose with a typical glucose meter, place the disposable strip in the meter. Then place a small sample of blood on the "test strip." The test strip is coated with chemicals that combine with glucose in blood. The meter measures how much glucose is present. The meter displays the glucose level as a number. Several new models can record and store a number of test results. Some models can connect to personal computers to store test results or print them out.   Newer meters are often easier to use than older models. Some meters allow you to get blood from places other than your fingertip. Some new models have automatic timing, error codes, signals, or barcode readers to help with proper adjustment (calibration). Some meters have a large display screen or spoken instructions for people with visual impairments.   INSTRUCTIONS FOR USING GLUCOSE METERS   Wash your hands with soap and warm water, or clean the area with alcohol. Dry your hands completely.   Prick the side of your fingertip with a lancet (a sharp-pointed tool used by hand).   Hold the hand down and gently milk the finger until a small drop of blood appears. Catch the blood with the test strip.    Follow the instructions for inserting the test strip and using the SMBG meter. Most meters require the meter to be turned on and the test strip to be inserted before applying the blood sample.   Record the test result.   Read the instructions carefully for both the meter and the test strips that go with it. Meter instructions are found in the user manual. Keep this manual to help you solve any problems that may arise. Many meters use "error codes" when there is a problem with the meter, the test strip, or the blood sample on the strip. You will need the manual to understand these error codes and fix the problem.   New devices are available such as laser lancets and meters that can test blood taken from "alternative sites" of the body, other than fingertips. However, you should use standard fingertip testing if your glucose changes rapidly. Also, use standard testing if:   You have eaten, exercised, or taken insulin in the past 2 hours.   You think your glucose is low.   You tend to not feel symptoms of low blood glucose (hypoglycemia).   You are ill or under stress.   Clean the meter as directed by the manufacturer.   Test the meter for accuracy as directed by the manufacturer.   Take your meter with you to your caregiver's office. This way, you can test your glucose in front of your caregiver to make sure you are using the meter correctly. Your caregiver can also take a sample of blood to test using a routine lab method. If values on the glucose meter are close to the lab results, you and your caregiver will see   that your meter is working well and you are using good technique. Your caregiver will advise you about what to do if the results do not match.  FREQUENCY OF TESTING    Your caregiver will tell you how often you should check your blood glucose. This will depend on your type of diabetes, your current level of diabetes control, and your types of medicines. The following are general guidelines, but your care plan may be different. Record all your readings and the time of day you took them for review with your caregiver.    Diabetes type 1.   When you are using insulin with good diabetic control (either multiple daily injections or via a pump), you should check your glucose 4 times a day.   If your diabetes is not well controlled, you may need to monitor more frequently, including before meals and 2 hours after meals, at bedtime, and occasionally between 2 a.m. and 3 a.m.   You should always check your glucose before a dose of insulin or before changing the rate on your insulin pump.   Diabetes type 2.   Guidelines for SMBG in diabetes type 2 are not as well defined.   If you are on insulin, follow the guidelines above.   If you are on medicines, but not insulin, and your glucose is not well controlled, you should test at least twice daily.   If you are not on insulin, and your diabetes is controlled with medicines or diet alone, you should test at least once daily, usually before breakfast.   A weekly profile will help your caregiver advise you on your care plan. The week before your visit, check your glucose before a meal and 2 hours after a meal at least daily. You may want to test before and after a different meal each day so you and your caregiver can tell how well controlled your blood sugars are throughout the course of a 24 hour period.   Gestational diabetes (diabetes during pregnancy).   Frequent testing is often necessary. Accurate timing is important.   If you are not on insulin, check your glucose 4 times a day. Check it before breakfast and 1 hour after the start of each meal.    If you are on insulin, check your glucose 6 times a day. Check it before each meal and 1 hour after the first bite of each meal.   General guidelines.   More frequent testing is required at the start of insulin treatment. Your caregiver will instruct you.   Test your glucose any time you suspect you have low blood sugar (hypoglycemia).   You should test more often when you change medicines, when you have unusual stress or illness, or in other unusual circumstances.  OTHER THINGS TO KNOW ABOUT GLUCOSE METERS   Measurement Range. Most glucose meters are able to read glucose levels over a broad range of values from as low as 0 to as high as 600 mg/dL. If you get an extremely high or low reading from your meter, you should first confirm it with another reading. Report very high or very low readings to your caregiver.   Whole Blood Glucose versus Plasma Glucose. Some older home glucose meters measure glucose in your whole blood. In a lab or when using some newer home glucose meters, the glucose is measured in your plasma (one component of blood). The difference can be important. It is important for you and your caregiver to know whether your meter   gives its results as "whole blood equivalent" or "plasma equivalent."   Display of High and Low Glucose Values. Part of learning how to operate a meter is understanding what the meter results mean. Know how high and low glucose concentrations are displayed on your meter.   Factors that Affect Glucose Meter Performance. The accuracy of your test results depends on many factors and varies depending on the brand and type of meter. These factors include:   Low red blood cell count (anemia).   Substances in your blood (such as uric acid, vitamin C, and others).   Environmental factors (temperature, humidity, altitude).   Name-brand versus generic test strips.    Calibration. Make sure your meter is set up properly. It is a good idea to do a calibration test with a control solution recommended by the manufacturer of your meter whenever you begin using a fresh bottle of test strips. This will help verify the accuracy of your meter.   Improperly stored, expired, or defective test strips. Keep your strips in a dry place with the lid on.   Soiled meter.   Inadequate blood sample.  NEW TECHNOLOGIES FOR GLUCOSE TESTING  Alternative site testing  Some glucose meters allow testing blood from alternative sites. These include the:   Upper arm.   Forearm.   Base of the thumb.   Thigh.  Sampling blood from alternative sites may be desirable. However, it may have some limitations. Blood in the fingertips show changes in glucose levels more quickly than blood in other parts of the body. This means that alternative site test results may be different from fingertip test results, not because of the meter's ability to test accurately, but because the actual glucose concentration can be different.   Continuous Glucose Monitoring  Devices to measure your blood glucose continuously are available, and others are in development. These methods can be more expensive than self-monitoring with a glucose meter. However, it is uncertain how effective and reliable these devices are. Your caregiver will advise you if this approach makes sense for you.  IF BLOOD SUGARS ARE CONTROLLED, PEOPLE WITH DIABETES REMAIN HEALTHIER.   SMBG is an important part of the treatment plan of patients with diabetes mellitus. Below are reasons for using SMBG:    It confirms that your glucose is at a specific, healthy level.   It detects hypoglycemia and severe hyperglycemia.   It allows you and your caregiver to make adjustments in response to changes in lifestyle for individuals requiring medicine.    It determines the need for starting insulin therapy in temporary diabetes that happens during pregnancy (gestational diabetes).  Document Released: 07/31/2003 Document Revised: 10/20/2011 Document Reviewed: 11/21/2010  [employer] Patient Information 2014 [employer].

## 2013-02-03 ENCOUNTER — Ambulatory Visit (INDEPENDENT_AMBULATORY_CARE_PROVIDER_SITE_OTHER): Payer: Medicaid Other | Admitting: Internal Medicine

## 2013-02-03 ENCOUNTER — Encounter: Payer: Self-pay | Admitting: Internal Medicine

## 2013-02-03 VITALS — BP 118/84 | HR 109 | Temp 98.1°F | Wt 247.0 lb

## 2013-02-03 DIAGNOSIS — B20 Human immunodeficiency virus [HIV] disease: Secondary | ICD-10-CM

## 2013-02-03 MED ORDER — EMTRICITABINE-TENOFOVIR DF 200-300 MG PO TABS: 1.0000 | ORAL_TABLET | Freq: Every day | ORAL | Status: DC

## 2013-02-03 NOTE — Progress Notes (Signed)
[facility]IN[facility] NOTE  RFV: routine visit Subjective:    Patient ID: Chris [name], male    DOB: September 25, 1965, 47 y.o.   MRN: [id number]  HPI Chris [name] a 47yo M, with HIV, CD 4 count 100 (5%)/VL 105,280, currently on RAL/DRVr/ETR who was last seen on 5/15. At that time, he was off of his HIV meds for roughly 2 wks. Genotype shows L74V, K101E, V118I, G190S, looks like ETR partial resistance. Missed 2 doses since last seen.  He was hospitalized from 6/2-6/5 for COPD exacerbation but now on home O2 supp.2LNC. Taking advair. Has albuterol nebs 3 x per day, takes albuterol inhaler 2x. Couldn't tell if steroids significantly improved his symptoms. Still gets "shortwinded with exertion". He gest very winded after ambulating 200 ft without oxygen.  Having difficulties with figuring out his new medicines for diabetes, ran out of test strips  Current Outpatient Prescriptions on File Prior to Visit  Medication Sig Dispense Refill  . albuterol (PROVENTIL HFA;VENTOLIN HFA) 108 (90 BASE) MCG/ACT inhaler Inhale 2 puffs into the lungs every 6 (six) hours as needed for wheezing.  1 Inhaler  6  . albuterol (PROVENTIL) (5 MG/ML) 0.5% nebulizer solution Take 0.5 mLs (2.5 mg total) by nebulization every 6 (six) hours as needed for wheezing or shortness of breath.  20 mL  11  . azithromycin (ZITHROMAX) 600 MG tablet Take 1 tablet (600 mg total) by mouth 2 (two) times a week. Take 1 tablet by mouth every Monday and Friday.  10 tablet  2  . dapsone 100 MG tablet Take 1 tablet (100 mg total) by mouth daily.  30 tablet  11  . darunavir (PREZISTA) 600 MG tablet Take 1 tablet (600 mg total) by mouth 2 (two) times daily.  60 tablet  11  . Fluticasone-Salmeterol (ADVAIR) 250-50 MCG/DOSE AEPB Inhale 1 puff into the lungs every 12 (twelve) hours.      [name] glucose blood (TRUE CARE TEST STRIP PACK) test strip Use as instructed  100 each  12  . insulin aspart (NOVOLOG) 100 UNIT/ML injection Inject 0-15 Units into the skin 3 (three) times  daily with meals. Sliding scale  1 vial  12  . Insulin Syringe-Needle U-100 (INSULIN SYRINGE .5CC/30GX1/2") 30G X 1/2" 0.5 ML MISC 1 each by Does not apply route 3 (three) times daily before meals.  90 each  0  . raltegravir (ISENTRESS) 400 MG tablet Take 1 tablet (400 mg total) by mouth 2 (two) times daily.  60 tablet  11  . ritonavir (NORVIR) 100 MG capsule Take 1 capsule (100 mg total) by mouth 2 (two) times daily.  60 capsule  11  . tiotropium (SPIRIVA) 18 MCG inhalation capsule Place 1 capsule (18 mcg total) into inhaler and inhale daily.  30 capsule  11  . Alcohol Swabs PADS 1 Package by Does not apply route as needed.  100 each  3  . Etravirine 200 MG TABS Take 1 tablet by mouth 2 (two) times daily.  60 tablet  11  . nicotine (NICODERM CQ - DOSED IN MG/24 HOURS) 21 mg/24hr patch Place 1 patch onto the skin daily.  28 patch  0   No current facility-administered medications on file prior to visit.   Active Ambulatory Problems    Diagnosis Date Noted  . HIV disease 12/06/2012  . COPD exacerbation 01/11/2013  . Nicotine abuse 01/11/2013  . Steroid-induced hyperglycemia 01/12/2013  . Acute respiratory failure with hypoxia 01/12/2013  . DM (diabetes mellitus) 01/27/2013   Resolved  Ambulatory Problems    Diagnosis Date Noted  . No Resolved Ambulatory Problems   Past Medical History  Diagnosis Date  . COPD (chronic obstructive pulmonary disease)   . Emphysema       Review of Systems Review of Systems  Constitutional: Negative for fever, chills, diaphoresis, activity change, appetite change, fatigue and unexpected weight change.  HENT: Negative for congestion, sore throat, rhinorrhea, sneezing, trouble swallowing and sinus pressure.  Eyes: Negative for photophobia and visual disturbance.  Respiratory: Negative for cough, chest tightness, shortness of breath, wheezing and stridor.  Cardiovascular: Negative for chest pain, palpitations and leg swelling.  Gastrointestinal: Negative  for nausea, vomiting, abdominal pain, diarrhea, constipation, blood in stool, abdominal distention and anal bleeding.  Genitourinary: Negative for dysuria, hematuria, flank pain and difficulty urinating.  Musculoskeletal: Negative for myalgias, back pain, joint swelling, arthralgias and gait problem.  Skin: Negative for color change, pallor, rash and wound.  Neurological: Negative for dizziness, tremors, weakness and light-headedness.  Hematological: Negative for adenopathy. Does not bruise/bleed easily.  Psychiatric/Behavioral: Negative for behavioral problems, confusion, sleep disturbance, dysphoric mood, decreased concentration and agitation.       Objective:   Physical Exam BP 118/84  Pulse 109  Temp(Src) 98.1 F (36.7 C) (Oral)  Wt 247 lb (112.038 kg)  BMI 37.56 kg/m2  SpO2 95% Physical Exam  Constitutional: He is oriented to person, place, and time. He appears well-developed and well-nourished. No distress.  HENT:  Mouth/Throat: Oropharynx is clear and moist. No oropharyngeal exudate.  Cardiovascular: Normal rate, regular rhythm and normal heart sounds. Exam reveals no gallop and no friction rub.  No murmur heard.  Pulmonary/Chest: Effort normal and breath sounds normal. No respiratory distress. He has no wheezes.  Abdominal: Soft. Bowel sounds are normal. He exhibits no distension. There is no tenderness.  Lymphadenopathy:  He has no cervical adenopathy.  Neurological: He is alert and oriented to person, place, and time.  Skin: Skin is warm and dry. No rash noted. No erythema.  Psychiatric: He has a normal mood and affect. His behavior is normal.          Assessment & Plan:  HIV: will add truvada to his salvage regimen and discontinue etravirine. In addition we will check for integrase inhibitor resistance  OI proph = continue with azithromycin weekly and dapsone daily  COPD : will move up his pcp appt on July 2nd and move up July 8th  rtc 4 wk

## 2013-02-09 ENCOUNTER — Ambulatory Visit: Payer: Self-pay

## 2013-02-14 ENCOUNTER — Ambulatory Visit: Payer: Self-pay

## 2013-02-15 ENCOUNTER — Encounter: Payer: Self-pay | Admitting: Internal Medicine

## 2013-02-15 ENCOUNTER — Ambulatory Visit (INDEPENDENT_AMBULATORY_CARE_PROVIDER_SITE_OTHER): Payer: Medicaid Other | Admitting: Internal Medicine

## 2013-02-15 VITALS — BP 100/60 | HR 100 | Temp 98.6°F | Ht 68.0 in | Wt 249.0 lb

## 2013-02-15 DIAGNOSIS — J9601 Acute respiratory failure with hypoxia: Secondary | ICD-10-CM

## 2013-02-15 DIAGNOSIS — F17200 Nicotine dependence, unspecified, uncomplicated: Secondary | ICD-10-CM

## 2013-02-15 DIAGNOSIS — J9600 Acute respiratory failure, unspecified whether with hypoxia or hypercapnia: Secondary | ICD-10-CM

## 2013-02-15 DIAGNOSIS — J449 Chronic obstructive pulmonary disease, unspecified: Secondary | ICD-10-CM

## 2013-02-15 MED ORDER — BUDESONIDE-FORMOTEROL FUMARATE 160-4.5 MCG/ACT IN AERO: INHALATION_SPRAY | RESPIRATORY_TRACT | Status: DC

## 2013-02-15 NOTE — Patient Instructions (Addendum)
Stop advair and spiriva  Plan A = Automatic = Start symbicort 160 Take 2 puffs first thing in am and then another 2 puffs about 12 hours later.   Plan B = backup= Only use your albuterol (as a rescue medication to be used if you can't catch your breath by resting or doing a relaxed purse lip breathing pattern. The less you use it, the better it will work when you need it. Ok to use up to 2 puffs every 4 hours  Plan C = nebulizer, use this only if plan B doesn't work ok to use up to 4 hours   The key is to stop smoking completely before smoking completely stops you!   Please schedule a follow up office visit in 4 weeks, sooner if needed

## 2013-02-15 NOTE — Progress Notes (Signed)
  Subjective:    Patient ID: Chris [name], male    DOB: [birth year]   MRN: [id number]  HPI  57 yobm smoker with HIV  and doe x 2009 much worse since March 2014 so referred by [name] to pulmonary clinic 02/15/2013.  02/15/2013 1st pulmonary eval  Cc indolent onset progressive doe x walking slow pace more than 100 ft,  not at rest., 02 dep, doe much worse x 3 months assoc with congested cough esp in am with sev tbsp thick white mucus takes about 30 min to clear.  No better on inhalers tried to date including advair and spiriva - albuterol works the best and using lots of saba and nebs  No obvious daytime variabilty or assoc  cp or chest tightness, subjective wheeze overt sinus   symptoms. No unusual exp hx or h/o childhood pna/ asthma or knowledge of premature birth.   Sleeping ok without nocturnal  or early am exacerbation  of respiratory  C/o's (x am cough)  or need for noct saba. Also denies any obvious fluctuation of symptoms with weather or environmental changes or other aggravating or alleviating factors except as outlined above    Review of Systems  Constitutional: Negative for fever, chills, activity change, appetite change and unexpected weight change.  HENT: Positive for trouble swallowing and dental problem. Negative for congestion, sore throat, rhinorrhea, sneezing, voice change and postnasal drip.   Eyes: Negative for visual disturbance.  Respiratory: Positive for cough and shortness of breath. Negative for choking.   Cardiovascular: Negative for chest pain and leg swelling.  Gastrointestinal: Positive for abdominal pain. Negative for nausea and vomiting.  Genitourinary: Negative for difficulty urinating.       Acid Heartburn  Musculoskeletal: Positive for arthralgias.  Skin: Negative for rash.  Psychiatric/Behavioral: Negative for behavioral problems and confusion.       Objective:   Physical Exam   amb bm very hoarseness but nad  Wt Readings from Last 3 Encounters:   02/15/13 249 lb (112.946 kg)  02/03/13 247 lb (112.038 kg)  01/27/13 257 lb (116.574 kg)    HEENT mild turbinate edema.  Oropharynx no thrush or excess pnd or cobblestoning.  No JVD or cervical adenopathy. Mild accessory muscle hypertrophy. Trachea midline, nl thryroid. Chest was hyperinflated by percussion with diminished breath sounds and moderate increased exp time without wheeze. Hoover sign positive at mid inspiration. Regular rate and rhythm without murmur gallop or rub or increase P2 or edema.  Abd: no hsm, nl excursion. Ext warm without cyanosis or clubbing.     cxr 01/10/13  1. Mild hyperexpansion with peribronchial cuffing. Findings are  similar to the prior examination, and may reflect reactive airway  disease.      Assessment & Plan:

## 2013-02-17 ENCOUNTER — Encounter: Payer: Self-pay | Admitting: Family Medicine

## 2013-02-17 ENCOUNTER — Ambulatory Visit: Payer: No Typology Code available for payment source | Attending: Family Medicine | Admitting: Family Medicine

## 2013-02-17 VITALS — BP 114/76 | HR 102 | Temp 98.2°F | Resp 20 | Ht 69.0 in | Wt 248.8 lb

## 2013-02-17 DIAGNOSIS — F17200 Nicotine dependence, unspecified, uncomplicated: Secondary | ICD-10-CM

## 2013-02-17 DIAGNOSIS — J449 Chronic obstructive pulmonary disease, unspecified: Secondary | ICD-10-CM

## 2013-02-17 DIAGNOSIS — IMO0001 Reserved for inherently not codable concepts without codable children: Secondary | ICD-10-CM

## 2013-02-17 DIAGNOSIS — R7309 Other abnormal glucose: Secondary | ICD-10-CM

## 2013-02-17 DIAGNOSIS — IMO0002 Reserved for concepts with insufficient information to code with codable children: Secondary | ICD-10-CM

## 2013-02-17 DIAGNOSIS — B20 Human immunodeficiency virus [HIV] disease: Secondary | ICD-10-CM

## 2013-02-17 DIAGNOSIS — J9610 Chronic respiratory failure, unspecified whether with hypoxia or hypercapnia: Secondary | ICD-10-CM

## 2013-02-17 DIAGNOSIS — E1165 Type 2 diabetes mellitus with hyperglycemia: Secondary | ICD-10-CM

## 2013-02-17 DIAGNOSIS — Z720 Tobacco use: Secondary | ICD-10-CM

## 2013-02-17 DIAGNOSIS — E119 Type 2 diabetes mellitus without complications: Secondary | ICD-10-CM

## 2013-02-17 DIAGNOSIS — F17210 Nicotine dependence, cigarettes, uncomplicated: Secondary | ICD-10-CM | POA: Insufficient documentation

## 2013-02-17 DIAGNOSIS — R739 Hyperglycemia, unspecified: Secondary | ICD-10-CM

## 2013-02-17 MED ORDER — INSULIN ASPART 100 UNIT/ML ~~LOC~~ SOLN
10.0000 [IU] | Freq: Once | SUBCUTANEOUS | Status: AC
  Administered 2013-02-17: 10 [IU] via SUBCUTANEOUS

## 2013-02-17 MED ORDER — INSULIN ASPART 100 UNIT/ML ~~LOC~~ SOLN: 5.0000 [IU] | Freq: Three times a day (TID) | SUBCUTANEOUS | Status: DC

## 2013-02-17 MED ORDER — INSULIN GLARGINE 100 UNIT/ML ~~LOC~~ SOLN: 12.0000 [IU] | Freq: Every day | SUBCUTANEOUS | Status: DC

## 2013-02-17 NOTE — Progress Notes (Signed)
Pt here for f/u post pulmonologist appt yesterday. Pt is on 2lnc 02.sats 92%Also states blood sugar was 417 with sx freq urination. Pain to R knee hx chronic pain

## 2013-02-17 NOTE — Assessment & Plan Note (Addendum)
DDX of  difficult airways managment all start with A and  include Adherence, Ace Inhibitors, Acid Reflux, Active Sinus Disease, Alpha 1 Antitripsin deficiency, Anxiety masquerading as Airways dz,  ABPA,  allergy(esp in young), Aspiration (esp in elderly), Adverse effects of DPI,  Active smokers, plus two Bs  = Bronchiectasis and Beta blocker use..and one C= CHF    Adherence is always the initial "prime suspect" and is a multilayered concern that requires a "trust but verify" approach in every patient - starting with knowing how to use medications, especially inhalers, correctly, keeping up with refills and understanding the fundamental difference between maintenance and prns vs those medications only taken for a very short course and then stopped and not refilled. The proper method of use, as well as anticipated side effects, of a metered-dose inhaler are discussed and demonstrated to the patient. Improved effectiveness after extensive coaching during this visit to a level of approximately  75% so change to symbicort 160 2bid  Active smoking > discussed separately   Acid reflux > rx options reviewed  Adverse effect of dpi > try change to symbicort 160 2bid

## 2013-02-17 NOTE — Assessment & Plan Note (Signed)

## 2013-02-17 NOTE — Progress Notes (Signed)
Patient ID: Chris [name], male   DOB: 1965/08/16, 47 y.o.   MRN: [id number]  CC:Followup diabetes mellitus  HPI: Patient reports that he has not been able to get a blood glucose meter and strips.  He reports that he is not taking his insulin as prescribed.  He reports that he has polyuria.  He reports that when he saw his pulmonologist yesterday his blood sugar was over 400.  In addition, the patient reports that he was started on new medications by his pulmonologist recently but is not on prednisone.  the patient reports that he is not taking any basal insulin at this time.  Allergies  Allergen Reactions  . Bactrim (Sulfamethoxazole-Tmp Ds) Hives   Past Medical History  Diagnosis Date  . COPD (chronic obstructive pulmonary disease)   . HIV disease   . Emphysema    Current Outpatient Prescriptions on File Prior to Visit  Medication Sig Dispense Refill  . albuterol (PROVENTIL HFA;VENTOLIN HFA) 108 (90 BASE) MCG/ACT inhaler Inhale 2 puffs into the lungs every 6 (six) hours as needed for wheezing.  1 Inhaler  6  . albuterol (PROVENTIL) (5 MG/ML) 0.5% nebulizer solution Take 0.5 mLs (2.5 mg total) by nebulization every 6 (six) hours as needed for wheezing or shortness of breath.  20 mL  11  . Alcohol Swabs PADS 1 Package by Does not apply route as needed.  100 each  3  . azithromycin (ZITHROMAX) 600 MG tablet Take 1 tablet (600 mg total) by mouth 2 (two) times a week. Take 1 tablet by mouth every Monday and Friday.  10 tablet  2  . budesonide-formoterol (SYMBICORT) 160-4.5 MCG/ACT inhaler Take 2 puffs first thing in am and then another 2 puffs about 12 hours later.  1 Inhaler    . dapsone 100 MG tablet Take 1 tablet (100 mg total) by mouth daily.  30 tablet  11  . darunavir (PREZISTA) 600 MG tablet Take 1 tablet (600 mg total) by mouth 2 (two) times daily.  60 tablet  11  . emtricitabine-tenofovir (TRUVADA) 200-300 MG per tablet Take 1 tablet by mouth daily.  30 tablet  11  . glucose blood (TRUE  CARE TEST STRIP PACK) test strip Use as instructed  100 each  12  . Insulin Syringe-Needle U-100 (INSULIN SYRINGE .5CC/30GX1/2") 30G X 1/2" 0.5 ML MISC 1 each by Does not apply route 3 (three) times daily before meals.  90 each  0  . raltegravir (ISENTRESS) 400 MG tablet Take 1 tablet (400 mg total) by mouth 2 (two) times daily.  60 tablet  11  . ritonavir (NORVIR) 100 MG capsule Take 1 capsule (100 mg total) by mouth 2 (two) times daily.  60 capsule  11   No current facility-administered medications on file prior to visit.   Family History  Problem Relation Age of Onset  . Emphysema Maternal Uncle     was a smoker  . Asthma Maternal Uncle   . Asthma Maternal Aunt   . Throat cancer Maternal Grandmother     never smoker, used snuff  . Breast cancer Maternal Aunt    History   Social History  . Marital Status: Married    Spouse Name: N/A    Number of Children: 0  . Years of Education: N/A   Occupational History  . Unemployed    Social History Main Topics  . Smoking status: Current Every Day Smoker -- 1.00 packs/day for 30 years    Types: Cigarettes  .  Smokeless tobacco: Never Used  . Alcohol Use: No  . Drug Use: No     Comment: quit 04  . Sexually Active: Yes    Birth Control/ Protection: Condom   Other Topics Concern  . Not on file   Social History Narrative  . No narrative on file    Review of Systems  Constitutional: Negative for fever, chills, diaphoresis, activity change, appetite change and fatigue.  HENT: Negative for ear pain, nosebleeds, congestion, facial swelling, rhinorrhea, neck pain, neck stiffness and ear discharge.   Eyes: Negative for pain, discharge, redness, itching and visual disturbance.  Respiratory: Negative for cough, choking, chest tightness, shortness of breath, wheezing and stridor.   Cardiovascular: Negative for chest pain, palpitations and leg swelling.  Gastrointestinal: Negative for abdominal distention.  Genitourinary: Negative for  dysuria, urgency, frequency, hematuria, flank pain, decreased urine volume, difficulty urinating and dyspareunia.  Musculoskeletal: Negative for back pain, joint swelling, arthralgias and gait problem.  Neurological: Negative for dizziness, tremors, seizures, syncope, facial asymmetry, speech difficulty, weakness, light-headedness, numbness and headaches.  Hematological: Negative for adenopathy. Does not bruise/bleed easily.  Psychiatric/Behavioral: Negative for hallucinations, behavioral problems, confusion, dysphoric mood, decreased concentration and agitation.    Objective:   Filed Vitals:   02/17/13 1101  BP: 114/76  Pulse: 102  Temp: 98.2 F (36.8 C)  Resp: 20    Physical Exam  Constitutional: Appears well-developed and well-nourished. No distress.  HENT: Normocephalic. External right and left ear normal. Oropharynx is clear and moist.  Eyes: Conjunctivae and EOM are normal. PERRLA, no scleral icterus.  Neck: Normal ROM. Neck supple. No JVD. No tracheal deviation. No thyromegaly.  CVS: RRR, S1/S2 +, no murmurs, no gallops, no carotid bruit.  Pulmonary: Effort and breath sounds normal, no stridor, rhonchi, wheezes, rales.  Abdominal: Soft. BS +,  no distension, tenderness, rebound or guarding.  Musculoskeletal: Normal range of motion. No edema and no tenderness.  Lymphadenopathy: No lymphadenopathy noted, cervical, inguinal. Neuro: Alert. Normal reflexes, muscle tone coordination. No cranial nerve deficit. Skin: Skin is warm and dry. No rash noted. Not diaphoretic. No erythema. No pallor.  Psychiatric: Normal mood and affect. Behavior, judgment, thought content normal.   Lab Results  Component Value Date   WBC 7.5 01/11/2013   HGB 13.3 01/11/2013   HCT 41.7 01/11/2013   MCV 91.0 01/11/2013   PLT 160 01/11/2013   Lab Results  Component Value Date   CREATININE 0.75 01/13/2013   BUN 19 01/13/2013   NA 134* 01/13/2013   K 4.5 01/13/2013   CL 96 01/13/2013   CO2 32 01/13/2013    Lab  Results  Component Value Date   HGBA1C 6.2* 01/12/2013   Lipid Panel     Component Value Date/Time   CHOL 237* 12/06/2012 1037   TRIG 181* 12/06/2012 1037   HDL 32* 12/06/2012 1037   CHOLHDL 7.4 12/06/2012 1037   VLDL 36 12/06/2012 1037   LDLCALC 169* 12/06/2012 1037     Assessment and plan:   Patient Active Problem List   Diagnosis Date Noted  . Smoker 02/17/2013  . Hyperglycemia 02/17/2013  . Uncontrolled diabetes mellitus 02/17/2013  . DM (diabetes mellitus) 01/27/2013  . Steroid-induced hyperglycemia 01/12/2013  . Chronic respiratory failure 01/12/2013  . COPD/ still smoking  01/11/2013  . Nicotine abuse 01/11/2013  . HIV disease 12/06/2012   Add lantus 12 units QhS Novolog 5 units TIDAC Continue using sliding scale until can get on lantus  Go get [facility] card and go to  MAP program for med assistance and for diabetic testing supplies.  the patient was given instructions to monitor his blood sugar closely.  The patient was counseled on the dangers of tobacco use, and was advised to quit.  Reviewed strategies to maximize success, including removing cigarettes and smoking materials from environment.  Monitor BS 5 times per day  RTC in 2 months  The patient was given clear instructions to go to ER or return to medical center if symptoms don't improve, worsen or new problems develop.  The patient verbalized understanding.  The patient was told to call to get any lab results if not heard anything in the next week.    Rodney Langton, MD, CDE, FAAFP [facility] [place], [place]

## 2013-02-17 NOTE — Assessment & Plan Note (Signed)
Adequate control on present rx, reviewed  

## 2013-02-17 NOTE — Patient Instructions (Addendum)
Chronic Obstructive Pulmonary Disease Chronic obstructive pulmonary disease (COPD) is a condition in which airflow from the lungs is restricted. The lungs can never return to normal, but there are measures you can take which will improve them and make you feel better. CAUSES   Smoking.  Exposure to secondhand smoke.  Breathing in irritants such as air pollution, dust, cigarette smoke, strong odors, aerosol sprays, or paint fumes.  History of lung infections. SYMPTOMS   Deep, persistent (chronic) cough with a large amount of thick mucus.  Wheezing.  Shortness of breath, especially with physical activity.  Feeling like you cannot get enough air.  Difficulty breathing.  Rapid breaths (tachypnea).  Gray or bluish discoloration (cyanosis) of the skin, especially in fingers, toes, or lips.  Fatigue.  Weight loss.  Swelling in legs, ankles, or feet.  Fast heartbeat (tachycardia).  Frequent lung infections.   Chest tightness. DIAGNOSIS  Initial diagnosis may be based on your history, symptoms, and physical examination. Additional tests for COPD may include:  Chest X-ray.  Computed tomography (CT) scan.  Lung (pulmonary) function tests.  Blood tests. TREATMENT  Treatment focuses on making you comfortable (supportive care). Your caregiver may prescribe medicines (inhaled or pills) to help improve your breathing. Additional treatment options may include oxygen therapy and pulmonary rehabilitation. Treatment should also include reducing your exposure to known irritants and following a plan to stop smoking. HOME CARE INSTRUCTIONS   Take all medicines, including antibiotic medicines, as directed by your caregiver.  Use inhaled medicines as directed by your caregiver.  Avoid medicines or cough syrups that dry up your airway (antihistamines) and slow down the elimination of secretions. This decreases respiratory capacity and may lead to infections.  If you smoke, stop  smoking.  Avoid exposure to smoke, chemicals, and fumes that aggravate your breathing.  Avoid contact with individuals that have a contagious illness.  Avoid extreme temperature and humidity changes.  Use humidifiers at home and at your bedside if they do not make breathing difficult.  Drink enough water and fluids to keep your urine clear or pale yellow. This loosens secretions.  Eat healthy foods. Eating smaller, more frequent meals and resting before meals may help you maintain your strength.  Ask your caregiver about the use of vitamins and mineral supplements.  Stay active. Exercise and physical activity will help maintain your ability to do things you want to do.  Balance activity with periods of rest.  Assume a position of comfort if you become short of breath.  Learn and use relaxation techniques.  Learn and use controlled breathing techniques as directed by your caregiver. Controlled breathing techniques include:  Pursed lip breathing. This breathing technique starts with breathing in (inhaling) through your nose for 1 second. Next, purse your lips as if you were going to whistle. Then breathe out (exhale) through the pursed lips for 2 seconds.  Diaphragmatic breathing. Start by putting one hand on your abdomen just above your waist. Inhale slowly through your nose. The hand on your abdomen should move out. Then exhale slowly through pursed lips. You should be able to feel the hand on your abdomen moving in as you exhale.  Learn and use controlled coughing to clear mucus from your lungs. Controlled coughing is a series of short, progressive coughs. The steps of controlled coughing are: 1. Lean your head slightly forward. 2. Breathe in deeply using diaphragmatic breathing. 3. Try to hold your breath for 3 seconds. 4. Keep your mouth slightly open while coughing twice.   5. Spit any mucus out into a tissue. 6. Rest and repeat the steps once or twice as needed.  Receive all  protective vaccines your caregiver suggests, especially pneumococcal and influenza vaccines.  Learn to manage stress.  Schedule and attend all follow-up appointments as directed by your caregiver. It is important to keep all your appointments.  Participate in pulmonary rehabilitation as directed by your caregiver.  Use home oxygen as suggested. SEEK MEDICAL CARE IF:   You are coughing up more mucus than usual.  There is a change in the color or thickness of the mucus.  Breathing is more labored than usual.  Your breathing is faster than usual.  Your skin color is more cyanotic than usual.  You are running out of the medicine you take for your breathing.  You are anxious, apprehensive, or restless.  You have a fever. SEEK IMMEDIATE MEDICAL CARE IF:   You have a rapid heart rate.  You have shortness of breath while you are resting.  You have shortness of breath that prevents you from being able to talk.  You have shortness of breath that prevents you from performing your usual physical activities.  You have chest pain lasting longer than 5 minutes.  You have a seizure.  Your family or friends notice that you are agitated or confused. MAKE SURE YOU:   Understand these instructions.  Will watch your condition.  Will get help right away if you are not doing well or get worse. Document Released: 05/07/2005 Document Revised: 04/21/2012 Document Reviewed: 09/27/2010 [facility] Patient Information 2014 [place], [place]. Hypoglycemia (Low Blood Sugar) Hypoglycemia is when the glucose (sugar) in your blood is too low. Hypoglycemia can happen for many reasons. It can happen to people with or without diabetes. Hypoglycemia can develop quickly and can be a medical emergency.  CAUSES  Having hypoglycemia does not mean that you will develop diabetes. Different causes include:  Missed or delayed meals or not enough carbohydrates eaten.  Medication overdose. This could be by  accident or deliberate. If by accident, your medication may need to be adjusted or changed.  Exercise or increased activity without adjustments in carbohydrates or medications.  A nerve disorder that affects body functions like your heart rate, blood pressure and digestion (autonomic neuropathy).  A condition where the stomach muscles do not function properly (gastroparesis). Therefore, medications may not absorb properly.  The inability to recognize the signs of hypoglycemia (hypoglycemic unawareness).  Absorption of insulin  may be altered.  Alcohol consumption.  Pregnancy/menstrual cycles/postpartum. This may be due to hormones.  Certain kinds of tumors. This is very rare. SYMPTOMS   Sweating.  Hunger.  Dizziness.  Blurred vision.  Drowsiness.  Weakness.  Headache.  Rapid heart beat.  Shakiness.  Nervousness. DIAGNOSIS  Diagnosis is made by monitoring blood glucose in one or all of the following ways:  Fingerstick blood glucose monitoring.  Laboratory results. TREATMENT  If you think your blood glucose is low:  Check your blood glucose, if possible. If it is less than 70 mg/dl, take one of the following:  3-4 glucose tablets.   cup juice (prefer clear like apple).   cup "regular" soda pop.  1 cup milk.  -1 tube of glucose gel.  5-6 hard candies.  Do not over treat because your blood glucose (sugar) will only go too high.  Wait 15 minutes and recheck your blood glucose. If it is still less than 70 mg/dl (or below your target range), repeat treatment.  Eat  a snack if it is more than one hour until your next meal. Sometimes, your blood glucose may go so low that you are unable to treat yourself. You may need someone to help you. You may even pass out or be unable to swallow. This may require you to get an injection of glucagon, which raises the blood glucose. HOME CARE INSTRUCTIONS  Check blood glucose as recommended by your caregiver.  Take  medication as prescribed by your caregiver.  Follow your meal plan. Do not skip meals. Eat on time.  If you are going to drink alcohol, drink it only with meals.  Check your blood glucose before driving.  Check your blood glucose before and after exercise. If you exercise longer or different than usual, be sure to check blood glucose more frequently.  Always carry treatment with you. Glucose tablets are the easiest to carry.  Always wear medical alert jewelry or carry some form of identification that states that you have diabetes. This will alert people that you have diabetes. If you have hypoglycemia, they will have a better idea on what to do. SEEK MEDICAL CARE IF:   You are having problems keeping your blood sugar at target range.  You are having frequent episodes of hypoglycemia.  You feel you might be having side effects from your medicines.  You have symptoms of an illness that is not improving after 3-4 days.  You notice a change in vision or a new problem with your vision. SEEK IMMEDIATE MEDICAL CARE IF:   You are a family member or friend of a person whose blood glucose goes below 70 mg/dl and is accompanied by:  Confusion.  A change in mental status.  The inability to swallow.  Passing out. Document Released: 07/28/2005 Document Revised: 10/20/2011 Document Reviewed: 11/24/2011 [facility] Patient Information 2014 [place], [place]. Blood Sugar Monitoring, Adult GLUCOSE METERS FOR SELF-MONITORING OF BLOOD GLUCOSE  It is important to be able to correctly measure your blood sugar (glucose). You can use a blood glucose monitor (a small battery-operated device) to check your glucose level at any time. This allows you and your caregiver to monitor your diabetes and to determine how well your treatment plan is working. The process of monitoring your blood glucose with a glucose meter is called self-monitoring of blood glucose (SMBG). When people with diabetes control their blood  sugar, they have better health. To test for glucose with a typical glucose meter, place the disposable strip in the meter. Then place a small sample of blood on the "test strip." The test strip is coated with chemicals that combine with glucose in blood. The meter measures how much glucose is present. The meter displays the glucose level as a number. Several new models can record and store a number of test results. Some models can connect to personal computers to store test results or print them out.  Newer meters are often easier to use than older models. Some meters allow you to get blood from places other than your fingertip. Some new models have automatic timing, error codes, signals, or barcode readers to help with proper adjustment (calibration). Some meters have a large display screen or spoken instructions for people with visual impairments.  INSTRUCTIONS FOR USING GLUCOSE METERS  Wash your hands with soap and warm water, or clean the area with alcohol. Dry your hands completely.  Prick the side of your fingertip with a lancet (a sharp-pointed tool used by hand).  Hold the hand down and gently milk  the finger until a small drop of blood appears. Catch the blood with the test strip.  Follow the instructions for inserting the test strip and using the SMBG meter. Most meters require the meter to be turned on and the test strip to be inserted before applying the blood sample.  Record the test result.  Read the instructions carefully for both the meter and the test strips that go with it. Meter instructions are found in the user manual. Keep this manual to help you solve any problems that may arise. Many meters use "error codes" when there is a problem with the meter, the test strip, or the blood sample on the strip. You will need the manual to understand these error codes and fix the problem.  New devices are available such as laser lancets and meters that can test blood taken from "alternative  sites" of the body, other than fingertips. However, you should use standard fingertip testing if your glucose changes rapidly. Also, use standard testing if:  You have eaten, exercised, or taken insulin in the past 2 hours.  You think your glucose is low.  You tend to not feel symptoms of low blood glucose (hypoglycemia).  You are ill or under stress.  Clean the meter as directed by the manufacturer.  Test the meter for accuracy as directed by the manufacturer.  Take your meter with you to your caregiver's office. This way, you can test your glucose in front of your caregiver to make sure you are using the meter correctly. Your caregiver can also take a sample of blood to test using a routine lab method. If values on the glucose meter are close to the lab results, you and your caregiver will see that your meter is working well and you are using good technique. Your caregiver will advise you about what to do if the results do not match. FREQUENCY OF TESTING  Your caregiver will tell you how often you should check your blood glucose. This will depend on your type of diabetes, your current level of diabetes control, and your types of medicines. The following are general guidelines, but your care plan may be different. Record all your readings and the time of day you took them for review with your caregiver.   Diabetes type 1.  When you are using insulin with good diabetic control (either multiple daily injections or via a pump), you should check your glucose 4 times a day.  If your diabetes is not well controlled, you may need to monitor more frequently, including before meals and 2 hours after meals, at bedtime, and occasionally between 2 a.m. and 3 a.m.  You should always check your glucose before a dose of insulin or before changing the rate on your insulin pump.  Diabetes type 2.  Guidelines for SMBG in diabetes type 2 are not as well defined.  If you are on insulin, follow the  guidelines above.  If you are on medicines, but not insulin, and your glucose is not well controlled, you should test at least twice daily.  If you are not on insulin, and your diabetes is controlled with medicines or diet alone, you should test at least once daily, usually before breakfast.  A weekly profile will help your caregiver advise you on your care plan. The week before your visit, check your glucose before a meal and 2 hours after a meal at least daily. You may want to test before and after a different meal each day  so you and your caregiver can tell how well controlled your blood sugars are throughout the course of a 24 hour period.  Gestational diabetes (diabetes during pregnancy).  Frequent testing is often necessary. Accurate timing is important.  If you are not on insulin, check your glucose 4 times a day. Check it before breakfast and 1 hour after the start of each meal.  If you are on insulin, check your glucose 6 times a day. Check it before each meal and 1 hour after the first bite of each meal.  General guidelines.  More frequent testing is required at the start of insulin treatment. Your caregiver will instruct you.  Test your glucose any time you suspect you have low blood sugar (hypoglycemia).  You should test more often when you change medicines, when you have unusual stress or illness, or in other unusual circumstances. OTHER THINGS TO KNOW ABOUT GLUCOSE METERS  Measurement Range. Most glucose meters are able to read glucose levels over a broad range of values from as low as 0 to as high as 600 mg/dL. If you get an extremely high or low reading from your meter, you should first confirm it with another reading. Report very high or very low readings to your caregiver.  Whole Blood Glucose versus Plasma Glucose. Some older home glucose meters measure glucose in your whole blood. In a lab or when using some newer home glucose meters, the glucose is measured in your  plasma (one component of blood). The difference can be important. It is important for you and your caregiver to know whether your meter gives its results as "whole blood equivalent" or "plasma equivalent."  Display of High and Low Glucose Values. Part of learning how to operate a meter is understanding what the meter results mean. Know how high and low glucose concentrations are displayed on your meter.  Factors that Affect Glucose Meter Performance. The accuracy of your test results depends on many factors and varies depending on the brand and type of meter. These factors include:  Low red blood cell count (anemia).  Substances in your blood (such as uric acid, vitamin C, and others).  Environmental factors (temperature, humidity, altitude).  Name-brand versus generic test strips.  Calibration. Make sure your meter is set up properly. It is a good idea to do a calibration test with a control solution recommended by the manufacturer of your meter whenever you begin using a fresh bottle of test strips. This will help verify the accuracy of your meter.  Improperly stored, expired, or defective test strips. Keep your strips in a dry place with the lid on.  Soiled meter.  Inadequate blood sample. NEW TECHNOLOGIES FOR GLUCOSE TESTING Alternative site testing Some glucose meters allow testing blood from alternative sites. These include the:  Upper arm.  Forearm.  Base of the thumb.  Thigh. Sampling blood from alternative sites may be desirable. However, it may have some limitations. Blood in the fingertips show changes in glucose levels more quickly than blood in other parts of the body. This means that alternative site test results may be different from fingertip test results, not because of the meter's ability to test accurately, but because the actual glucose concentration can be different.  Continuous Glucose Monitoring Devices to measure your blood glucose continuously are available,  and others are in development. These methods can be more expensive than self-monitoring with a glucose meter. However, it is uncertain how effective and reliable these devices are. Your caregiver will advise  you if this approach makes sense for you. IF BLOOD SUGARS ARE CONTROLLED, PEOPLE WITH DIABETES REMAIN HEALTHIER.  SMBG is an important part of the treatment plan of patients with diabetes mellitus. Below are reasons for using SMBG:   It confirms that your glucose is at a specific, healthy level.  It detects hypoglycemia and severe hyperglycemia.  It allows you and your caregiver to make adjustments in response to changes in lifestyle for individuals requiring medicine.  It determines the need for starting insulin therapy in temporary diabetes that happens during pregnancy (gestational diabetes). Document Released: 07/31/2003 Document Revised: 10/20/2011 Document Reviewed: 11/21/2010 [facility] Patient Information 2014 [place], [place].

## 2013-02-25 ENCOUNTER — Institutional Professional Consult (permissible substitution): Payer: Self-pay | Admitting: Pulmonary Disease

## 2013-02-25 ENCOUNTER — Ambulatory Visit: Payer: Self-pay

## 2013-03-03 ENCOUNTER — Ambulatory Visit: Payer: Self-pay

## 2013-03-03 ENCOUNTER — Ambulatory Visit (INDEPENDENT_AMBULATORY_CARE_PROVIDER_SITE_OTHER): Payer: Medicaid Other | Admitting: Internal Medicine

## 2013-03-03 ENCOUNTER — Encounter: Payer: Self-pay | Admitting: Internal Medicine

## 2013-03-03 VITALS — BP 118/71 | HR 87 | Temp 98.6°F | Wt 246.0 lb

## 2013-03-03 DIAGNOSIS — IMO0001 Reserved for inherently not codable concepts without codable children: Secondary | ICD-10-CM

## 2013-03-03 DIAGNOSIS — B20 Human immunodeficiency virus [HIV] disease: Secondary | ICD-10-CM

## 2013-03-03 DIAGNOSIS — Z23 Encounter for immunization: Secondary | ICD-10-CM

## 2013-03-03 LAB — CBC WITH DIFFERENTIAL/PLATELET
Eosinophils Absolute: 0.1 10*3/uL (ref 0.0–0.7)
Eosinophils Relative: 2 % (ref 0–5)
Hemoglobin: 12.9 g/dL — ABNORMAL LOW (ref 13.0–17.0)
Lymphocytes Relative: 38 % (ref 12–46)
Lymphs Abs: 2 10*3/uL (ref 0.7–4.0)
MCH: 29.1 pg (ref 26.0–34.0)
MCV: 89.2 fL (ref 78.0–100.0)
Monocytes Relative: 13 % — ABNORMAL HIGH (ref 3–12)
RBC: 4.43 MIL/uL (ref 4.22–5.81)

## 2013-03-03 LAB — COMPREHENSIVE METABOLIC PANEL
CO2: 29 mEq/L (ref 19–32)
Calcium: 9.1 mg/dL (ref 8.4–10.5)
Chloride: 97 mEq/L (ref 96–112)
Glucose, Bld: 262 mg/dL — ABNORMAL HIGH (ref 70–99)
Sodium: 134 mEq/L — ABNORMAL LOW (ref 135–145)
Total Bilirubin: 0.6 mg/dL (ref 0.3–1.2)
Total Protein: 7.4 g/dL (ref 6.0–8.3)

## 2013-03-03 NOTE — Progress Notes (Signed)
[facility] CLIN[facility] NOTE  RFV: routine Subjective:    Patient ID: Chris [name], male    DOB: October 23, 1965, 47 y.o.   MRN: [id number]  HPI 47yo M, with HIV-HCV co infection, CD 4 count 100 (5%)/VL 105,280, currently on RAL/DRVr/truvada who was last seen on late June. At that time, he was off of his HIV meds for roughly 2 wks. Genotype shows L74V, K101E, V118I, G190S. He reports only missing one dose of new regimen (same except exchanging truvada for ETR since partial resistance) he has seen pulmonology and IM for management of COPD and DM. Remains to be on home O2 supp.2LNC. Taking advair. Has albuterol nebs 3 x per day, takes albuterol inhaler 2x. Couldn't tell if steroids significantly improved his symptoms. Still gets "shortwinded with exertion". He gest very winded after ambulating 200 ft without oxygen. Now takes symbicort with albuterol. Following on the 4th with Dr. Sherene Sires.    Current Outpatient Prescriptions on File Prior to Visit  Medication Sig Dispense Refill  . albuterol (PROVENTIL HFA;VENTOLIN HFA) 108 (90 BASE) MCG/ACT inhaler Inhale 2 puffs into the lungs every 6 (six) hours as needed for wheezing.  1 Inhaler  6  . albuterol (PROVENTIL) (5 MG/ML) 0.5% nebulizer solution Take 0.5 mLs (2.5 mg total) by nebulization every 6 (six) hours as needed for wheezing or shortness of breath.  20 mL  11  . Alcohol Swabs PADS 1 Package by Does not apply route as needed.  100 each  3  . azithromycin (ZITHROMAX) 600 MG tablet Take 1 tablet (600 mg total) by mouth 2 (two) times a week. Take 1 tablet by mouth every Monday and Friday.  10 tablet  2  . budesonide-formoterol (SYMBICORT) 160-4.5 MCG/ACT inhaler Take 2 puffs first thing in am and then another 2 puffs about 12 hours later.  1 Inhaler    . dapsone 100 MG tablet Take 1 tablet (100 mg total) by mouth daily.  30 tablet  11  . darunavir (PREZISTA) 600 MG tablet Take 1 tablet (600 mg total) by mouth 2 (two) times daily.  60 tablet  11  .  emtricitabine-tenofovir (TRUVADA) 200-300 MG per tablet Take 1 tablet by mouth daily.  30 tablet  11  . glucose blood (TRUE CARE TEST STRIP PACK) test strip Use as instructed  100 each  12  . insulin aspart (NOVOLOG) 100 UNIT/ML injection Inject 5 Units into the skin 3 (three) times daily before meals. Do not take if you don't eat or have very low carb meal.  1 vial  12  . insulin glargine (LANTUS) 100 UNIT/ML injection Inject 0.12 mLs (12 Units total) into the skin at bedtime.  10 mL  6  . Insulin Syringe-Needle U-100 (INSULIN SYRINGE .5CC/30GX1/2") 30G X 1/2" 0.5 ML MISC 1 each by Does not apply route 3 (three) times daily before meals.  90 each  0  . raltegravir (ISENTRESS) 400 MG tablet Take 1 tablet (400 mg total) by mouth 2 (two) times daily.  60 tablet  11  . ritonavir (NORVIR) 100 MG capsule Take 1 capsule (100 mg total) by mouth 2 (two) times daily.  60 capsule  11   No current facility-administered medications on file prior to visit.   Active Ambulatory Problems    Diagnosis Date Noted  . HIV disease 12/06/2012  . COPD/ still smoking  01/11/2013  . Nicotine abuse 01/11/2013  . Steroid-induced hyperglycemia 01/12/2013  . Chronic respiratory failure 01/12/2013  . DM (diabetes mellitus) 01/27/2013  .  Smoker 02/17/2013  . Hyperglycemia 02/17/2013  . Uncontrolled diabetes mellitus 02/17/2013   Resolved Ambulatory Problems    Diagnosis Date Noted  . No Resolved Ambulatory Problems   Past Medical History  Diagnosis Date  . COPD (chronic obstructive pulmonary disease)   . Emphysema    History  Substance Use Topics  . Smoking status: Current Every Day Smoker -- 2.00 packs/day for 30 years    Types: Cigarettes  . Smokeless tobacco: Never Used  . Alcohol Use: No  has decreased his smoking from 1ppd down to 2 cigs per day.   Review of Systems 12 point ROS negative except for impotence. Has recurrent drainage from right breast. Which has been a longstanding issue for him for the  past 18 mo.    Objective:   Physical Exam  BP 118/71  Pulse 87  Temp(Src) 98.6 F (37 C) (Oral)  Wt 246 lb (111.585 kg)  BMI 36.31 kg/m2  SpO2 95% Physical Exam  Constitutional: He is oriented to person, place, and time. He appears well-developed and well-nourished. No distress.  HENT:  Mouth/Throat: Oropharynx is clear and moist. No oropharyngeal exudate.  Cardiovascular: Normal rate, regular rhythm and normal heart sounds. Exam reveals no gallop and no friction rub.  No murmur heard.  Pulmonary/Chest: Effort normal and breath sounds normal. No respiratory distress. He has no wheezes.  Abdominal: Soft. Bowel sounds are normal. He exhibits no distension. There is no tenderness.  Lymphadenopathy:  He has no cervical adenopathy.  Neurological: He is alert and oriented to person, place, and time.  Skin: Skin is warm and dry. No rash noted. No erythema.  Psychiatric: He has a normal mood and affect. His behavior is normal.       Assessment & Plan:  HIV = continue on salvage regimen. Blood work today  oi proph = continue on dapsone  COPD = continue with symbicort and albuterol. Follow up with Dr. Sherene Sires  DM = continue on plan of lantus and novolog. Does not have strip and glucometer. Using someone else's machine before every meal. Follow up with Dr. Laural Benes  HCV = immune to hep B, will immunize hep A. Will check HCV genotype and HCV viral load  Health maintenance = flu vaccine at next  Visit  Cyst beneath right nipple = has cloudy exudate. Will check prolactin.  Impotence = will address at next visit  Sign up with [facility] to help with management of other comorbidities

## 2013-03-04 LAB — HIV-1 RNA QUANT-NO REFLEX-BLD
HIV 1 RNA Quant: 20 copies/mL (ref <20)
HIV-1 RNA Quant, Log: 1.3 {Log} (ref <1.30)

## 2013-03-04 LAB — T-HELPER CELL (CD4) - (RCID CLINIC ONLY)
CD4 % Helper T Cell: 7 % — ABNORMAL LOW (ref 33–55)
CD4 T Cell Abs: 130 uL — ABNORMAL LOW (ref 400–2700)

## 2013-03-07 ENCOUNTER — Ambulatory Visit: Payer: Self-pay

## 2013-03-15 ENCOUNTER — Ambulatory Visit (INDEPENDENT_AMBULATORY_CARE_PROVIDER_SITE_OTHER): Payer: Medicaid Other | Admitting: Internal Medicine

## 2013-03-15 ENCOUNTER — Encounter: Payer: Self-pay | Admitting: Internal Medicine

## 2013-03-15 VITALS — BP 118/62 | HR 91 | Temp 98.6°F | Ht 69.0 in | Wt 249.0 lb

## 2013-03-15 DIAGNOSIS — J449 Chronic obstructive pulmonary disease, unspecified: Secondary | ICD-10-CM

## 2013-03-15 DIAGNOSIS — J9610 Chronic respiratory failure, unspecified whether with hypoxia or hypercapnia: Secondary | ICD-10-CM

## 2013-03-15 DIAGNOSIS — J4489 Other specified chronic obstructive pulmonary disease: Secondary | ICD-10-CM

## 2013-03-15 DIAGNOSIS — F17200 Nicotine dependence, unspecified, uncomplicated: Secondary | ICD-10-CM

## 2013-03-15 NOTE — Progress Notes (Signed)
  Subjective:    Patient ID: Chris [name], male    DOB: 1965/10/24   MRN: [id number]  HPI  80 yobm smoker with HIV  and doe x 2009 much worse since March 2014 so referred by [name] to pulmonary clinic 02/15/2013.  02/15/2013 1st pulmonary eval  Cc indolent onset progressive doe x walking slow pace more than 100 ft,  not at rest., 02 dep, doe much worse x 3 months assoc with congested cough esp in am with sev tbsp thick white mucus takes about 30 min to clear.  No better on inhalers tried to date including advair and spiriva - albuterol works the best and using lots of saba and nebs rec Stop advair and spiriva Plan A = Automatic = Start symbicort 160 Take 2 puffs first thing in am and then another 2 puffs about 12 hours later.  Plan B = backup= Only use your albuterol (as a rescue medication to be used if you can't catch your breath by resting or doing a relaxed purse lip breathing pattern. The less you use it, the better it will work when you need it. Ok to use up to 2 puffs every 4 hours Plan C = nebulizer, use this only if plan B doesn't work ok to use up to 4 hours  03/15/2013 f/u ov/[name] still smoking  Chief Complaint  Patient presents with  . 4 wk follow up    Breathing worse at times.  Wheezing, chest tightness at times, and cough with yellow to clear mucus at times also reported.  Fatigues easily.   has not followed the previous instructions and symptoms have not really changed   No obvious daytime variabilty or assoc  cp or chest tightness, subjective wheeze overt sinus   symptoms. No unusual exp hx or h/o childhood pna/ asthma or knowledge of premature birth.   Sleeping ok without nocturnal  or early am exacerbation  of respiratory  C/o's (x am cough)  or need for noct saba. Also denies any obvious fluctuation of symptoms with weather or environmental changes or other aggravating or alleviating factors except as outlined above          Objective:   Physical Exam   amb bm very  hoarseness but nad   03/15/2013        249  Wt Readings from Last 3 Encounters:  02/15/13 249 lb (112.946 kg)  02/03/13 247 lb (112.038 kg)  01/27/13 257 lb (116.574 kg)    HEENT mild turbinate edema.  Oropharynx no thrush or excess pnd or cobblestoning.  No JVD or cervical adenopathy. Mild accessory muscle hypertrophy. Trachea midline, nl thryroid. Chest was hyperinflated by percussion with diminished breath sounds and moderate increased exp time without wheeze. Hoover sign positive at mid inspiration. Regular rate and rhythm without murmur gallop or rub or increase P2 or edema.  Abd: no hsm, nl excursion. Ext warm without cyanosis or clubbing.     cxr 01/10/13  1. Mild hyperexpansion with peribronchial cuffing. Findings are  similar to the prior examination, and may reflect reactive airway  disease.      Assessment & Plan:

## 2013-03-15 NOTE — Patient Instructions (Addendum)
Plan A = Automatic = Start symbicort 160 Take 2 puffs first thing in am and then another 2 puffs about 12 hours later.   Plan B = backup= Only use your albuterol (proiare) as a rescue medication to be used if you can't catch your breath by resting or doing a relaxed purse lip breathing pattern. The less you use it, the better it will work when you need it. Ok to use up to 2 puffs every 4 hours  Plan C = nebulizer, use this only if plan B doesn't work after 15 min ok to use up to 4 hours   The key is to stop smoking completely before smoking completely stops you!   Please schedule a follow up office visit in 4 weeks, sooner if needed with pfts on return

## 2013-03-16 NOTE — Assessment & Plan Note (Signed)
In this case Adherence is the biggest issue and starts with  inability to use HFA effectively and also  understand that SABA treats the symptoms but doesn't get to the underlying problem (inflammation).  I used  the analogy of putting steroid cream on a rash to help explain the meaning of topical therapy and the need to get the drug to the target tissue.    The proper method of use, as well as anticipated side effects, of a metered-dose inhaler are discussed and demonstrated to the patient. Improved effectiveness after extensive coaching during this visit to a level of approximately  90% from a baseline of < 50   Active smoking discussed separately

## 2013-03-16 NOTE — Assessment & Plan Note (Signed)
>   3 min discussion (same as previous ov)    I emphasized that although we never turn away smokers from the pulmonary clinic, we do ask that they understand that the recommendations that we make  won't work nearly as well in the presence of continued cigarette exposure.  In fact, we may very well  reach a point where we can't promise to help the patient if he/she can't quit smoking. (We can and will promise to try to help, we just can't promise what we recommend will really work)

## 2013-03-16 NOTE — Assessment & Plan Note (Signed)
02 2lpm 24/7  Adequate control on present rx, reviewed > no change in rx needed

## 2013-03-25 ENCOUNTER — Telehealth: Payer: Self-pay | Admitting: Internal Medicine

## 2013-03-25 MED ORDER — GLUCOSE BLOOD VI STRP: ORAL_STRIP | Status: DC

## 2013-03-25 MED ORDER — FREESTYLE SYSTEM KIT: 1.0000 | PACK | Status: DC | PRN

## 2013-03-25 NOTE — Telephone Encounter (Signed)
Pt does not have meter and strips, and need scripts to obtain these through [facility].

## 2013-04-10 ENCOUNTER — Observation Stay (HOSPITAL_COMMUNITY)
Admission: EM | Admit: 2013-04-10 | Discharge: 2013-04-11 | Disposition: A | Discharge status: Home / Self Care | Payer: No Typology Code available for payment source | Attending: Internal Medicine | Admitting: Internal Medicine

## 2013-04-10 ENCOUNTER — Emergency Department (HOSPITAL_COMMUNITY): Payer: No Typology Code available for payment source

## 2013-04-10 ENCOUNTER — Encounter (HOSPITAL_COMMUNITY): Payer: Self-pay | Admitting: Emergency Medicine

## 2013-04-10 ENCOUNTER — Encounter (HOSPITAL_COMMUNITY)
Admission: EM | Disposition: A | Discharge status: Home / Self Care | Payer: Self-pay | Source: Home / Self Care | Attending: Emergency Medicine

## 2013-04-10 ENCOUNTER — Observation Stay (HOSPITAL_COMMUNITY): Payer: No Typology Code available for payment source | Admitting: Anesthesiology

## 2013-04-10 ENCOUNTER — Encounter (HOSPITAL_COMMUNITY): Payer: Self-pay | Admitting: Anesthesiology

## 2013-04-10 DIAGNOSIS — J449 Chronic obstructive pulmonary disease, unspecified: Secondary | ICD-10-CM

## 2013-04-10 DIAGNOSIS — J438 Other emphysema: Secondary | ICD-10-CM | POA: Insufficient documentation

## 2013-04-10 DIAGNOSIS — Z21 Asymptomatic human immunodeficiency virus [HIV] infection status: Secondary | ICD-10-CM

## 2013-04-10 DIAGNOSIS — E119 Type 2 diabetes mellitus without complications: Secondary | ICD-10-CM | POA: Insufficient documentation

## 2013-04-10 DIAGNOSIS — N61 Mastitis without abscess: Principal | ICD-10-CM | POA: Insufficient documentation

## 2013-04-10 DIAGNOSIS — N611 Abscess of the breast and nipple: Secondary | ICD-10-CM

## 2013-04-10 DIAGNOSIS — B20 Human immunodeficiency virus [HIV] disease: Secondary | ICD-10-CM

## 2013-04-10 DIAGNOSIS — Z794 Long term (current) use of insulin: Secondary | ICD-10-CM | POA: Insufficient documentation

## 2013-04-10 DIAGNOSIS — F17200 Nicotine dependence, unspecified, uncomplicated: Secondary | ICD-10-CM | POA: Insufficient documentation

## 2013-04-10 DIAGNOSIS — Z79899 Other long term (current) drug therapy: Secondary | ICD-10-CM | POA: Insufficient documentation

## 2013-04-10 DIAGNOSIS — B1920 Unspecified viral hepatitis C without hepatic coma: Secondary | ICD-10-CM | POA: Insufficient documentation

## 2013-04-10 HISTORY — DX: Type 2 diabetes mellitus without complications: E11.9

## 2013-04-10 HISTORY — PX: IRRIGATION AND DEBRIDEMENT ABSCESS: SHX5252

## 2013-04-10 LAB — CBC
Hemoglobin: 12.5 g/dL — ABNORMAL LOW (ref 13.0–17.0)
MCH: 29.6 pg (ref 26.0–34.0)
MCV: 90.3 fL (ref 78.0–100.0)
Platelets: 157 10*3/uL (ref 150–400)
RBC: 4.22 MIL/uL (ref 4.22–5.81)
WBC: 10.7 10*3/uL — ABNORMAL HIGH (ref 4.0–10.5)

## 2013-04-10 LAB — DIFFERENTIAL
Lymphs Abs: 1.8 10*3/uL (ref 0.7–4.0)
Monocytes Absolute: 1.1 10*3/uL — ABNORMAL HIGH (ref 0.1–1.0)
Monocytes Relative: 11 % (ref 3–12)
Neutro Abs: 7.3 10*3/uL (ref 1.7–7.7)
Neutrophils Relative %: 71 % (ref 43–77)

## 2013-04-10 LAB — BASIC METABOLIC PANEL
CO2: 24 mEq/L (ref 19–32)
Calcium: 8.8 mg/dL (ref 8.4–10.5)
Chloride: 99 mEq/L (ref 96–112)
Creatinine, Ser: 0.67 mg/dL (ref 0.50–1.35)
Glucose, Bld: 265 mg/dL — ABNORMAL HIGH (ref 70–99)

## 2013-04-10 LAB — GLUCOSE, CAPILLARY: Glucose-Capillary: 199 mg/dL — ABNORMAL HIGH (ref 70–99)

## 2013-04-10 SURGERY — IRRIGATION AND DEBRIDEMENT ABSCESS
Anesthesia: General | Site: Breast | Laterality: Right | Wound class: Dirty or Infected

## 2013-04-10 MED ORDER — PIPERACILLIN-TAZOBACTAM 3.375 G IVPB 30 MIN
3.3750 g | Freq: Once | INTRAVENOUS | Status: AC
  Administered 2013-04-10: 3.375 g via INTRAVENOUS
  Filled 2013-04-10: qty 50

## 2013-04-10 MED ORDER — CLINDAMYCIN PHOSPHATE 600 MG/50ML IV SOLN
600.0000 mg | Freq: Once | INTRAVENOUS | Status: AC
  Administered 2013-04-10: 600 mg via INTRAVENOUS
  Filled 2013-04-10: qty 50

## 2013-04-10 MED ORDER — LACTATED RINGERS IV SOLN
INTRAVENOUS | Status: DC | PRN
  Administered 2013-04-10: 17:00:00 via INTRAVENOUS

## 2013-04-10 MED ORDER — HYDROMORPHONE HCL PF 1 MG/ML IJ SOLN: 0.2500 mg | INTRAMUSCULAR | Status: DC | PRN

## 2013-04-10 MED ORDER — MORPHINE SULFATE 2 MG/ML IJ SOLN: 2.0000 mg | INTRAMUSCULAR | Status: DC | PRN

## 2013-04-10 MED ORDER — METOCLOPRAMIDE HCL 5 MG/ML IJ SOLN
10.0000 mg | Freq: Once | INTRAMUSCULAR | Status: AC
  Administered 2013-04-10: 10 mg via INTRAMUSCULAR
  Filled 2013-04-10: qty 2

## 2013-04-10 MED ORDER — EMTRICITABINE-TENOFOVIR DF 200-300 MG PO TABS
1.0000 | ORAL_TABLET | Freq: Every day | ORAL | Status: DC
  Administered 2013-04-11: 1 via ORAL
  Filled 2013-04-10: qty 1

## 2013-04-10 MED ORDER — VANCOMYCIN HCL IN DEXTROSE 1-5 GM/200ML-% IV SOLN
1000.0000 mg | Freq: Three times a day (TID) | INTRAVENOUS | Status: DC
  Administered 2013-04-10 – 2013-04-11 (×3): 1000 mg via INTRAVENOUS
  Filled 2013-04-10 (×4): qty 200

## 2013-04-10 MED ORDER — 0.9 % SODIUM CHLORIDE (POUR BTL) OPTIME
TOPICAL | Status: DC | PRN
  Administered 2013-04-10: 1000 mL

## 2013-04-10 MED ORDER — DARUNAVIR ETHANOLATE 600 MG PO TABS
600.0000 mg | ORAL_TABLET | Freq: Two times a day (BID) | ORAL | Status: DC
  Administered 2013-04-11: 600 mg via ORAL
  Filled 2013-04-10 (×3): qty 1

## 2013-04-10 MED ORDER — DAPSONE 100 MG PO TABS
100.0000 mg | ORAL_TABLET | Freq: Every day | ORAL | Status: DC
Start: 2013-04-11 — End: 2013-04-11
  Administered 2013-04-11: 100 mg via ORAL
  Filled 2013-04-10: qty 1

## 2013-04-10 MED ORDER — BUDESONIDE-FORMOTEROL FUMARATE 160-4.5 MCG/ACT IN AERO
2.0000 | INHALATION_SPRAY | Freq: Two times a day (BID) | RESPIRATORY_TRACT | Status: DC
  Administered 2013-04-11: 2 via RESPIRATORY_TRACT
  Filled 2013-04-10 (×2): qty 6

## 2013-04-10 MED ORDER — LIDOCAINE HCL (CARDIAC) 20 MG/ML IV SOLN
INTRAVENOUS | Status: DC | PRN
  Administered 2013-04-10: 40 mg via INTRAVENOUS

## 2013-04-10 MED ORDER — ALBUTEROL SULFATE HFA 108 (90 BASE) MCG/ACT IN AERS: 2.0000 | INHALATION_SPRAY | Freq: Four times a day (QID) | RESPIRATORY_TRACT | Status: DC | PRN

## 2013-04-10 MED ORDER — OXYCODONE-ACETAMINOPHEN 5-325 MG PO TABS: 1.0000 | ORAL_TABLET | ORAL | Status: DC | PRN

## 2013-04-10 MED ORDER — ALBUTEROL SULFATE (5 MG/ML) 0.5% IN NEBU: 2.5000 mg | INHALATION_SOLUTION | Freq: Four times a day (QID) | RESPIRATORY_TRACT | Status: DC | PRN

## 2013-04-10 MED ORDER — PROPOFOL 10 MG/ML IV BOLUS
INTRAVENOUS | Status: DC | PRN
  Administered 2013-04-10: 200 mg via INTRAVENOUS

## 2013-04-10 MED ORDER — INSULIN ASPART 100 UNIT/ML ~~LOC~~ SOLN
0.0000 [IU] | Freq: Three times a day (TID) | SUBCUTANEOUS | Status: DC
  Administered 2013-04-11 (×2): 2 [IU] via SUBCUTANEOUS

## 2013-04-10 MED ORDER — HYDROCODONE-ACETAMINOPHEN 5-325 MG PO TABS: 1.0000 | ORAL_TABLET | ORAL | Status: DC | PRN

## 2013-04-10 MED ORDER — DOCUSATE SODIUM 100 MG PO CAPS
100.0000 mg | ORAL_CAPSULE | Freq: Two times a day (BID) | ORAL | Status: DC
  Administered 2013-04-10 – 2013-04-11 (×2): 100 mg via ORAL
  Filled 2013-04-10 (×2): qty 1

## 2013-04-10 MED ORDER — HEPARIN SODIUM (PORCINE) 5000 UNIT/ML IJ SOLN
5000.0000 [IU] | Freq: Three times a day (TID) | INTRAMUSCULAR | Status: DC
  Administered 2013-04-10 – 2013-04-11 (×2): 5000 [IU] via SUBCUTANEOUS
  Filled 2013-04-10 (×5): qty 1

## 2013-04-10 MED ORDER — OXYCODONE HCL 5 MG PO TABS: 5.0000 mg | ORAL_TABLET | Freq: Once | ORAL | Status: DC | PRN

## 2013-04-10 MED ORDER — FENTANYL CITRATE 0.05 MG/ML IJ SOLN
INTRAMUSCULAR | Status: DC | PRN
  Administered 2013-04-10: 25 ug via INTRAVENOUS
  Administered 2013-04-10: 50 ug via INTRAVENOUS
  Administered 2013-04-10: 25 ug via INTRAVENOUS
  Administered 2013-04-10: 50 ug via INTRAVENOUS

## 2013-04-10 MED ORDER — IBUPROFEN 800 MG PO TABS: 800.0000 mg | ORAL_TABLET | Freq: Two times a day (BID) | ORAL | Status: DC | PRN

## 2013-04-10 MED ORDER — DIPHENHYDRAMINE HCL 50 MG/ML IJ SOLN
50.0000 mg | Freq: Once | INTRAMUSCULAR | Status: AC
  Administered 2013-04-10: 50 mg via INTRAMUSCULAR
  Filled 2013-04-10: qty 1

## 2013-04-10 MED ORDER — RITONAVIR 100 MG PO CAPS
100.0000 mg | ORAL_CAPSULE | Freq: Two times a day (BID) | ORAL | Status: DC
  Administered 2013-04-11: 100 mg via ORAL
  Filled 2013-04-10 (×3): qty 1

## 2013-04-10 MED ORDER — OXYCODONE HCL 5 MG/5ML PO SOLN: 5.0000 mg | Freq: Once | ORAL | Status: DC | PRN

## 2013-04-10 MED ORDER — SODIUM CHLORIDE 0.9 % IV BOLUS (SEPSIS)
1000.0000 mL | Freq: Once | INTRAVENOUS | Status: AC
  Administered 2013-04-10: 1000 mL via INTRAVENOUS

## 2013-04-10 MED ORDER — PIPERACILLIN-TAZOBACTAM 3.375 G IVPB
3.3750 g | Freq: Three times a day (TID) | INTRAVENOUS | Status: DC
  Administered 2013-04-11 (×2): 3.375 g via INTRAVENOUS
  Filled 2013-04-10 (×3): qty 50

## 2013-04-10 MED ORDER — KETOROLAC TROMETHAMINE 60 MG/2ML IM SOLN
60.0000 mg | Freq: Once | INTRAMUSCULAR | Status: AC
  Administered 2013-04-10: 60 mg via INTRAMUSCULAR
  Filled 2013-04-10: qty 2

## 2013-04-10 SURGICAL SUPPLY — 45 items
BANDAGE GAUZE ELAST BULKY 4 IN (GAUZE/BANDAGES/DRESSINGS) IMPLANT
BLADE SURG 10 STRL SS (BLADE) ×2 IMPLANT
BLADE SURG 15 STRL LF DISP TIS (BLADE) ×1 IMPLANT
BLADE SURG 15 STRL SS (BLADE) ×1
CANISTER SUCTION 2500CC (MISCELLANEOUS) ×2 IMPLANT
CHLORAPREP W/TINT 26ML (MISCELLANEOUS) ×2 IMPLANT
CLEANER TIP ELECTROSURG 2X2 (MISCELLANEOUS) ×2 IMPLANT
CLOTH BEACON ORANGE TIMEOUT ST (SAFETY) ×2 IMPLANT
COVER SURGICAL LIGHT HANDLE (MISCELLANEOUS) ×2 IMPLANT
DECANTER SPIKE VIAL GLASS SM (MISCELLANEOUS) IMPLANT
DRAPE CHEST BREAST 15X10 FENES (DRAPES) ×2 IMPLANT
DRAPE EXTREMITY T 121X128X90 (DRAPE) IMPLANT
DRAPE LAPAROSCOPIC ABDOMINAL (DRAPES) IMPLANT
DRSG PAD ABDOMINAL 8X10 ST (GAUZE/BANDAGES/DRESSINGS) ×2 IMPLANT
ELECT REM PT RETURN 9FT ADLT (ELECTROSURGICAL) ×2
ELECTRODE REM PT RTRN 9FT ADLT (ELECTROSURGICAL) ×1 IMPLANT
GAUZE PACKING IODOFORM 1/2 (PACKING) ×2 IMPLANT
GAUZE SPONGE 4X4 16PLY XRAY LF (GAUZE/BANDAGES/DRESSINGS) IMPLANT
GLOVE BIO SURGEON STRL SZ7 (GLOVE) ×2 IMPLANT
GLOVE BIOGEL PI IND STRL 7.5 (GLOVE) ×1 IMPLANT
GLOVE BIOGEL PI INDICATOR 7.5 (GLOVE) ×1
GOWN STRL NON-REIN LRG LVL3 (GOWN DISPOSABLE) ×4 IMPLANT
KIT BASIN OR (CUSTOM PROCEDURE TRAY) ×2 IMPLANT
KIT ROOM TURNOVER OR (KITS) ×2 IMPLANT
NEEDLE HYPO 25X1 1.5 SAFETY (NEEDLE) IMPLANT
NS IRRIG 1000ML POUR BTL (IV SOLUTION) ×2 IMPLANT
PACK SURGICAL SETUP 50X90 (CUSTOM PROCEDURE TRAY) ×2 IMPLANT
PAD ARMBOARD 7.5X6 YLW CONV (MISCELLANEOUS) ×4 IMPLANT
PENCIL BUTTON HOLSTER BLD 10FT (ELECTRODE) ×2 IMPLANT
SPECIMEN JAR SMALL (MISCELLANEOUS) ×2 IMPLANT
SPONGE GAUZE 4X4 12PLY (GAUZE/BANDAGES/DRESSINGS) ×2 IMPLANT
SPONGE LAP 18X18 X RAY DECT (DISPOSABLE) IMPLANT
SUT MNCRL AB 4-0 PS2 18 (SUTURE) IMPLANT
SUT VIC AB 3-0 SH 27 (SUTURE)
SUT VIC AB 3-0 SH 27XBRD (SUTURE) IMPLANT
SWAB COLLECTION DEVICE MRSA (MISCELLANEOUS) ×2 IMPLANT
SYR BULB 3OZ (MISCELLANEOUS) ×2 IMPLANT
SYR CONTROL 10ML LL (SYRINGE) IMPLANT
TAPE CLOTH SURG 4X10 WHT LF (GAUZE/BANDAGES/DRESSINGS) ×2 IMPLANT
TOWEL OR 17X24 6PK STRL BLUE (TOWEL DISPOSABLE) ×2 IMPLANT
TOWEL OR 17X26 10 PK STRL BLUE (TOWEL DISPOSABLE) ×2 IMPLANT
TUBE ANAEROBIC SPECIMEN COL (MISCELLANEOUS) ×2 IMPLANT
TUBE CONNECTING 12X1/4 (SUCTIONS) IMPLANT
WATER STERILE IRR 1000ML POUR (IV SOLUTION) IMPLANT
YANKAUER SUCT BULB TIP NO VENT (SUCTIONS) IMPLANT

## 2013-04-10 NOTE — ED Provider Notes (Signed)
Medical screening examination/treatment/procedure(s) were conducted as a shared visit with non-physician practitioner(s) and myself.  I personally evaluated the patient during the encounter   Patient with AIDS and recurrent breast abscess. Is deep by both exam and ultrasound with signs of cellulitis. Otherwise is not systemically ill, but given size and comorbidities is not appropriate for ED drainage. Given clinda in ED, will need admit and OR drainage.  Audree Camel, MD 04/10/13 [phone]

## 2013-04-10 NOTE — Op Note (Signed)
Preoperative diagnosis: acute on chronic right breast abscess Postoperative diagnosis: same as above Procedure: incision, drainage, and debridement of right breast abscess Surgeon: Dr. Harden Mo Anesthesia: Gen. Specimens: Right breast tissue not marked to pathology, Cultures Estimated blood loss: Minimal Drains: None Complications: None Sponge and needle count correct at completion Disposition to recovery stable  Indications: This is a 88 yom with multiple medical problems including diabetes, HIV disease, and COPD who is still actively smoking who has about over a year history of a right breast abscess. This area has gotten much worse and on exam that he has a fluctuant tender erythematous right breast associated with an elevated white blood cell count. I discussed with him going to the operating room today and incising and draining this. We specifically discussed the risk of recurrence given his other comorbidities.  Procedure: After informed consent was obtained the patient was taken to the operating room. He was administered clindamycin by the emergency room. I gave him a dose of Zosyn prior to beginning. He had sequential compression devices on his legs. He was placed under general anesthesia without complication. His breast was then prepped and draped in the standard sterile surgical fashion. A surgical timeout was performed.  I made a periareolar incision in the upper outer quadrant over the most tender fluctuant area. There was a rush of a fair amount of purulence once I entered into this cavity. Cultures were taken. This was completely evacuated. Once I had done this there was a lot of thickening on all of the different walls with some chronic infections. I then decided to debride all of these walls and removed a fair amount of tissue that was present. I sent these all off as a specimen. I then obtained hemostasis. Once I had done this I packed this with iodoform. He tolerated this well  was extubated and transferred to recovery stable.

## 2013-04-10 NOTE — Preoperative (Signed)
Beta Blockers   Reason not to administer Beta Blockers:Not on a Beta Blocker at home

## 2013-04-10 NOTE — Progress Notes (Signed)
ANTIBIOTIC CONSULT NOTE - INITIAL  Pharmacy Consult for Vancomycin and Zosyn Indication: Empiric for R breast abscess -- concern for MRSA  Allergies  Allergen Reactions  . Bactrim [Sulfamethoxazole-Tmp Ds] Hives    Patient Measurements: Height: 5\' 9"  (175.3 cm) Weight: 240 lb (108.863 kg) IBW/kg (Calculated) : 70.7   Vital Signs: Temp: 98.1 F (36.7 C) (08/31 1938) BP: 133/76 mmHg (08/31 1938) Pulse Rate: 79 (08/31 1938) Intake/Output from previous day:   Intake/Output from this shift:    Labs:  Recent Labs  04/10/13 1154  WBC 10.7*  HGB 12.5*  PLT 157  CREATININE 0.67   Estimated Creatinine Clearance: 140.3 ml/min (by C-G formula based on Cr of 0.67). No results found for this basename: VANCOTROUGH, VANCOPEAK, VANCORANDOM, GENTTROUGH, GENTPEAK, GENTRANDOM, TOBRATROUGH, TOBRAPEAK, TOBRARND, AMIKACINPEAK, AMIKACINTROU, AMIKACIN,  in the last 72 hours   Microbiology: No results found for this or any previous visit (from the past 720 hour(s)).  Medical History: Past Medical History  Diagnosis Date  . COPD (chronic obstructive pulmonary disease)   . HIV disease   . Emphysema   . Diabetes mellitus without complication     Medications:  Prescriptions prior to admission  Medication Sig Dispense Refill  . albuterol (PROVENTIL HFA;VENTOLIN HFA) 108 (90 BASE) MCG/ACT inhaler Inhale 2 puffs into the lungs every 6 (six) hours as needed for wheezing.      [name] albuterol (PROVENTIL) (5 MG/ML) 0.5% nebulizer solution Take 2.5 mg by nebulization every 6 (six) hours as needed for wheezing.      . budesonide-formoterol (SYMBICORT) 160-4.5 MCG/ACT inhaler Inhale 2 puffs into the lungs 2 (two) times daily.      . dapsone 100 MG tablet Take 100 mg by mouth daily.      . darunavir (PREZISTA) 600 MG tablet Take 600 mg by mouth 2 (two) times daily with a meal.      . emtricitabine-tenofovir (TRUVADA) 200-300 MG per tablet Take 1 tablet by mouth daily.      [name] ibuprofen (ADVIL,MOTRIN)  800 MG tablet Take 800 mg by mouth 2 (two) times daily as needed for pain.      [name] insulin aspart (NOVOLOG) 100 UNIT/ML injection Inject 5 Units into the skin 3 (three) times daily with meals.      . ritonavir (NORVIR) 100 MG capsule Take 100 mg by mouth 2 (two) times daily.       Assessment: 47 yo M with R breast abscess (x 1 year) s/p I&D today.   Goal of Therapy:  Vancomycin trough level 15-20 mcg/ml  Plan:  - Vancomycin 1g q8h - Zosyn 3.375G IV over 30 min x 1 NOW - Zosyn 3.375G IV q8h to be infused over 4 hours - Follow up SCr, UOP, cultures, clinical course and adjust as clinically indicated - Vancomycin trough at Css  Nasya Vincent L. Illene Bolus, PharmD, BCPS Clinical Pharmacist Pager: [phone] Pharmacy: [phone] 04/10/2013 8:12 PM
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
## 2013-04-10 NOTE — Anesthesia Preprocedure Evaluation (Addendum)
Anesthesia Evaluation  Patient identified by MRN, date of birth, ID band Patient awake    Reviewed: Allergy & Precautions, H&P , NPO status , Patient's Chart, lab work & pertinent test results  Airway Mallampati: III TM Distance: >3 FB Neck ROM: Full    Dental no notable dental hx. (+) Teeth Intact and Dental Advisory Given   Pulmonary COPD COPD inhaler and oxygen dependent, Current Smoker,  breath sounds clear to auscultation  Pulmonary exam normal       Cardiovascular negative cardio ROS  Rhythm:Regular Rate:Normal     Neuro/Psych negative neurological ROS  negative psych ROS   GI/Hepatic negative GI ROS, Neg liver ROS,   Endo/Other  diabetes, Poorly Controlled, Type 1, Insulin Dependent  Renal/GU negative Renal ROS  negative genitourinary   Musculoskeletal   Abdominal   Peds  Hematology  (+) HIV,   Anesthesia Other Findings   Reproductive/Obstetrics negative OB ROS                        Anesthesia Physical Anesthesia Plan  ASA: III  Anesthesia Plan: General   Post-op Pain Management:    Induction: Intravenous  Airway Management Planned: LMA  Additional Equipment:   Intra-op Plan:   Post-operative Plan: Extubation in OR  Informed Consent: I have reviewed the patients History and Physical, chart, labs and discussed the procedure including the risks, benefits and alternatives for the proposed anesthesia with the patient or authorized representative who has indicated his/her understanding and acceptance.   Dental advisory given  Plan Discussed with: CRNA  Anesthesia Plan Comments:        Anesthesia Quick Evaluation

## 2013-04-10 NOTE — Transfer of Care (Signed)
Immediate Anesthesia Transfer of Care Note  Patient: Chris [name]  Procedure(s) Performed: Procedure(s): IRRIGATION AND DEBRIDEMENT ABSCESS (Right)  Patient Location: PACU  Anesthesia Type:General  Level of Consciousness: sedated  Airway & Oxygen Therapy: Patient Spontanous Breathing and Patient connected to face mask oxygen  Post-op Assessment: Report given to PACU RN and Post -op Vital signs reviewed and stable  Post vital signs: Reviewed and stable  Complications: No apparent anesthesia complications

## 2013-04-10 NOTE — Anesthesia Procedure Notes (Signed)
Procedure Name: LMA Insertion Date/Time: 04/10/2013 5:24 PM Performed by: Alanda Amass A Pre-anesthesia Checklist: Patient identified, Timeout performed, Emergency Drugs available, Suction available and Patient being monitored Patient Re-evaluated:Patient Re-evaluated prior to inductionOxygen Delivery Method: Circle system utilized Preoxygenation: Pre-oxygenation with 100% oxygen Intubation Type: IV induction LMA: LMA inserted LMA Size: 5.0 Number of attempts: 1 Tube secured with: Tape Dental Injury: Teeth and Oropharynx as per pre-operative assessment

## 2013-04-10 NOTE — ED Notes (Signed)
Pt c/o cyst to right breast. Pt reports 4 episode of same in the same area. Area swollen and very tender to touch. Pt reports drainage coming from the nipple area.

## 2013-04-10 NOTE — Anesthesia Postprocedure Evaluation (Signed)
  Anesthesia Post-op Note  Patient: Chris [name]  Procedure(s) Performed: Procedure(s): IRRIGATION AND DEBRIDEMENT ABSCESS (Right)  Patient Location: PACU  Anesthesia Type:General  Level of Consciousness: awake  Airway and Oxygen Therapy: Patient Spontanous Breathing and Patient connected to nasal cannula oxygen  Post-op Pain: none  Post-op Assessment: Post-op Vital signs reviewed, Patient's Cardiovascular Status Stable, Respiratory Function Stable, Patent Airway and No signs of Nausea or vomiting  Post-op Vital Signs: Reviewed and stable  Complications: No apparent anesthesia complications
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
## 2013-04-11 LAB — BASIC METABOLIC PANEL
BUN: 8 mg/dL (ref 6–23)
CO2: 26 mEq/L (ref 19–32)
Calcium: 8.7 mg/dL (ref 8.4–10.5)
Creatinine, Ser: 0.59 mg/dL (ref 0.50–1.35)
GFR calc non Af Amer: >90 mL/min (ref >90)
Glucose, Bld: 163 mg/dL — ABNORMAL HIGH (ref 70–99)
Potassium: 4.1 mEq/L (ref 3.5–5.1)
Sodium: 135 mEq/L (ref 135–145)

## 2013-04-11 LAB — CBC WITH DIFFERENTIAL/PLATELET
Eosinophils Absolute: 0.1 10*3/uL (ref 0.0–0.7)
Eosinophils Relative: 1 % (ref 0–5)
HCT: 37.3 % — ABNORMAL LOW (ref 39.0–52.0)
Lymphocytes Relative: 25 % (ref 12–46)
Lymphs Abs: 2.2 10*3/uL (ref 0.7–4.0)
MCH: 28.8 pg (ref 26.0–34.0)
MCV: 91.9 fL (ref 78.0–100.0)
Monocytes Absolute: 0.8 10*3/uL (ref 0.1–1.0)
Neutrophils Relative %: 65 % (ref 43–77)
Platelets: 149 10*3/uL — ABNORMAL LOW (ref 150–400)
RBC: 4.06 MIL/uL — ABNORMAL LOW (ref 4.22–5.81)
WBC: 8.8 10*3/uL (ref 4.0–10.5)

## 2013-04-11 LAB — GLUCOSE, CAPILLARY: Glucose-Capillary: 176 mg/dL — ABNORMAL HIGH (ref 70–99)

## 2013-04-11 MED ORDER — AMOXICILLIN-POT CLAVULANATE 875-125 MG PO TABS: 1.0000 | ORAL_TABLET | Freq: Two times a day (BID) | ORAL | Status: DC

## 2013-04-11 NOTE — Progress Notes (Signed)
Pt left AMA. AMA form signed and in chart. Dressing supplies given to pt, states he will change at home. Pt has own portable O2 tank and left with tank.

## 2013-04-11 NOTE — Progress Notes (Signed)
Patient ID: Chris [name], male   DOB: 01-24-1966, 47 y.o.   MRN: [id number] [facility] Progress Note:   1 Day Post-Op  Subjective: Mental status is clear.  Patient complaining that he wants to go home.  On IV antibiotics, immunocompromised, cultures pending on abscess contents.   Objective: Vital signs in last 24 hours: Temp:  [97.8 F (36.6 C)-98.8 F (37.1 C)] 98.6 F (37 C) (09/01 0933) Pulse Rate:  [78-95] 82 (09/01 0933) Resp:  [16-26] 20 (09/01 0933) BP: (113-140)/(61-89) 128/74 mmHg (09/01 0933) SpO2:  [91 %-99 %] 94 % (09/01 0933) Weight:  [240 lb (108.863 kg)] 240 lb (108.863 kg) (08/31 2000)  Intake/Output from previous day: 08/31 0701 - 09/01 0700 In: 800 [I.V.:800] Out: 550 [Urine:500; Blood:50] Intake/Output this shift: Total I/O In: -  Out: 450 [Urine:450]  Physical Exam: Work of breathing is appears not labored.  Right breast abscess site is dressed.    Lab Results:  Results for orders placed during the hospital encounter of 04/10/13 (from the past 48 hour(s))  CBC     Status: Abnormal   Collection Time    04/10/13 11:54 AM      Result Value Range   WBC 10.7 (*) 4.0 - 10.5 K/uL   RBC 4.22  4.22 - 5.81 MIL/uL   Hemoglobin 12.5 (*) 13.0 - 17.0 g/dL   HCT 95.6 (*) 21.3 - 08.6 %   MCV 90.3  78.0 - 100.0 fL   MCH 29.6  26.0 - 34.0 pg   MCHC 32.8  30.0 - 36.0 g/dL   RDW 57.8  46.9 - 62.9 %   Platelets 157  150 - 400 K/uL  BASIC METABOLIC PANEL     Status: Abnormal   Collection Time    04/10/13 11:54 AM      Result Value Range   Sodium 133 (*) 135 - 145 mEq/L   Potassium 3.8  3.5 - 5.1 mEq/L   Chloride 99  96 - 112 mEq/L   CO2 24  19 - 32 mEq/L   Glucose, Bld 265 (*) 70 - 99 mg/dL   BUN 8  6 - 23 mg/dL   Creatinine, Ser 5.28  0.50 - 1.35 mg/dL   Calcium 8.8  8.4 - 41.3 mg/dL   GFR calc non Af Amer >90  >90 mL/min   GFR calc Af Amer >90  >90 mL/min   Comment: (NOTE)     The eGFR has been calculated using the CKD EPI equation.     This  calculation has not been validated in all clinical situations.     eGFR's persistently <90 mL/min signify possible Chronic Kidney     Disease.  DIFFERENTIAL     Status: Abnormal   Collection Time    04/10/13 11:54 AM      Result Value Range   Neutrophils Relative % 71  43 - 77 %   Neutro Abs 7.3  1.7 - 7.7 K/uL   Lymphocytes Relative 18  12 - 46 %   Lymphs Abs 1.8  0.7 - 4.0 K/uL   Monocytes Relative 11  3 - 12 %   Monocytes Absolute 1.1 (*) 0.1 - 1.0 K/uL   Eosinophils Relative 1  0 - 5 %   Eosinophils Absolute 0.1  0.0 - 0.7 K/uL   Basophils Relative 0  0 - 1 %   Basophils Absolute 0.0  0.0 - 0.1 K/uL  CULTURE, ROUTINE-ABSCESS     Status: None   Collection Time  04/10/13  5:39 PM      Result Value Range   Specimen Description ABSCESS RIGHT BREAST     Special Requests NONE     Gram Stain PENDING     Culture       Value: NO GROWTH     Performed at [facility]   Report Status PENDING    GLUCOSE, CAPILLARY     Status: Abnormal   Collection Time    04/10/13  6:14 PM      Result Value Range   Glucose-Capillary 199 (*) 70 - 99 mg/dL   Comment 1 Notify RN    GLUCOSE, CAPILLARY     Status: Abnormal   Collection Time    04/10/13  9:46 PM      Result Value Range   Glucose-Capillary 243 (*) 70 - 99 mg/dL  CBC WITH DIFFERENTIAL     Status: Abnormal   Collection Time    04/11/13  5:50 AM      Result Value Range   WBC 8.8  4.0 - 10.5 K/uL   RBC 4.06 (*) 4.22 - 5.81 MIL/uL   Hemoglobin 11.7 (*) 13.0 - 17.0 g/dL   HCT 21.3 (*) 08.6 - 57.8 %   MCV 91.9  78.0 - 100.0 fL   MCH 28.8  26.0 - 34.0 pg   MCHC 31.4  30.0 - 36.0 g/dL   RDW 46.9  62.9 - 52.8 %   Platelets 149 (*) 150 - 400 K/uL   Neutrophils Relative % 65  43 - 77 %   Neutro Abs 5.7  1.7 - 7.7 K/uL   Lymphocytes Relative 25  12 - 46 %   Lymphs Abs 2.2  0.7 - 4.0 K/uL   Monocytes Relative 10  3 - 12 %   Monocytes Absolute 0.8  0.1 - 1.0 K/uL   Eosinophils Relative 1  0 - 5 %   Eosinophils Absolute 0.1  0.0  - 0.7 K/uL   Basophils Relative 0  0 - 1 %   Basophils Absolute 0.0  0.0 - 0.1 K/uL  BASIC METABOLIC PANEL     Status: Abnormal   Collection Time    04/11/13  5:50 AM      Result Value Range   Sodium 135  135 - 145 mEq/L   Potassium 4.1  3.5 - 5.1 mEq/L   Chloride 102  96 - 112 mEq/L   CO2 26  19 - 32 mEq/L   Glucose, Bld 163 (*) 70 - 99 mg/dL   BUN 8  6 - 23 mg/dL   Creatinine, Ser 4.13  0.50 - 1.35 mg/dL   Calcium 8.7  8.4 - 24.4 mg/dL   GFR calc non Af Amer >90  >90 mL/min   GFR calc Af Amer >90  >90 mL/min   Comment: (NOTE)     The eGFR has been calculated using the CKD EPI equation.     This calculation has not been validated in all clinical situations.     eGFR's persistently <90 mL/min signify possible Chronic Kidney     Disease.  GLUCOSE, CAPILLARY     Status: Abnormal   Collection Time    04/11/13  8:11 AM      Result Value Range   Glucose-Capillary 176 (*) 70 - 99 mg/dL   Comment 1 Notify RN      Radiology/Results: [place] Misc Soft Tissue  04/10/2013   *RADIOLOGY REPORT*  Clinical Data:  47 year old male with right breast swelling, pain  and tenderness.  History of previous retroareolar abscesses. HIV positive.  RIGHT BREAST ULTRASOUND  Comparison:  None  Findings: Ultrasound is performed, showing a 4.4 x 4 x 4.3 cm complicated collection in the subareolar right breast without internal vascular flow - compatible with a abscess.  IMPRESSION: 4.4 x 4 x 4.3 cm complicated collection within the subareolar right breast compatible with a subareolar abscess.  Clinical follow-up / surgical consultation is recommended.   Original Report Authenticated By: Harmon Pier, M.D.    Anti-infectives: Anti-infectives   Start     Dose/Rate Route Frequency Ordered Stop   04/11/13 1000  dapsone tablet 100 mg     100 mg Oral Daily 04/10/13 2003     04/11/13 1000  emtricitabine-tenofovir (TRUVADA) 200-300 MG per tablet 1 tablet     1 tablet Oral Daily 04/10/13 2003     04/11/13 0800  darunavir  (PREZISTA) tablet 600 mg     600 mg Oral 2 times daily with meals 04/10/13 2003     04/11/13 0800  ritonavir (NORVIR) capsule 100 mg     100 mg Oral 2 times daily with meals 04/10/13 2003     04/11/13 0600  piperacillin-tazobactam (ZOSYN) IVPB 3.375 g     3.375 g 12.5 mL/hr over 240 Minutes Intravenous 3 times per day 04/10/13 2019     04/10/13 2200  vancomycin (VANCOCIN) IVPB 1000 mg/200 mL premix     1,000 mg 200 mL/hr over 60 Minutes Intravenous Every 8 hours 04/10/13 2019     04/10/13 2100  piperacillin-tazobactam (ZOSYN) IVPB 3.375 g     3.375 g 100 mL/hr over 30 Minutes Intravenous  Once 04/10/13 2019 04/10/13 2204   04/10/13 1630  [MAR Hold]  piperacillin-tazobactam (ZOSYN) IVPB 3.375 g     (On MAR Hold since 04/10/13 1717)   3.375 g 100 mL/hr over 30 Minutes Intravenous  Once 04/10/13 1618 04/10/13 1725   04/10/13 1345  clindamycin (CLEOCIN) IVPB 600 mg     600 mg 100 mL/hr over 30 Minutes Intravenous  Once 04/10/13 1342 04/10/13 1635      Assessment/Plan: Problem List: Patient Active Problem List   Diagnosis Date Noted  . Abscess of right breast 04/10/2013  . Smoker 02/17/2013  . Hyperglycemia 02/17/2013  . Uncontrolled diabetes mellitus 02/17/2013  . DM (diabetes mellitus) 01/27/2013  . Steroid-induced hyperglycemia 01/12/2013  . Chronic respiratory failure 01/12/2013  . COPD/ still smoking  01/11/2013  . Nicotine abuse 01/11/2013  . HIV disease 12/06/2012    Right breast abscess with packing in place.  Probable dressing change and packing removal tomorrow.  Antibiotic choices per cultures.   1 Day Post-Op    LOS: 1 day   Matt B. Daphine Deutscher, MD, [facility] [phone] beeper [phone]  04/11/2013 10:04 AM

## 2013-04-11 NOTE — Progress Notes (Signed)
Subjective: Patient seen and examined at bedside. He was wanting to leave and go home today. His birthday is tomorrow and there is "no way" he will still be here then. He was wanting to leave immediately and threatened to pull out his own IV. He was able to be convinced to stay for his next dose of antibiotics and then he will leave AMA. He has a reasonable plan for getting home and taking care of his wound. He states that the pain is much better since I and D in OR. He ate full breakfast this morning.   Objective: Vital signs in last 24 hours: Filed Vitals:   04/10/13 2137 04/11/13 0230 04/11/13 0618 04/11/13 0933  BP: 116/61 117/67 116/67 128/74  Pulse: 90 87 79 82  Temp: 98.3 F (36.8 C) 98.5 F (36.9 C) 97.9 F (36.6 C) 98.6 F (37 C)  TempSrc: Oral Oral Oral   Resp: 19 18 18 20   Height:      Weight:      SpO2: 92% 96% 93% 94%   Weight change:   Intake/Output Summary (Last 24 hours) at 04/11/13 1437 Last data filed at 04/11/13 0900  Gross per 24 hour  Intake   1160 ml  Output   1000 ml  Net    160 ml   General: resting in bed, demanding to leave now HEENT: PERRL, EOMI, no scleral icterus Cardiac: S1 S2 heard, no murmurs Chest wall: Right I and D packed and dressing in place with some yellow-brown discharge and tender to palpation, did not remove dressing which is covering most of his right chest  Pulm: clear to auscultation bilaterally, moving normal volumes of air Abd: soft, nontender, nondistended, BS present, obese Ext: warm and well perfused, no pedal edema Neuro: alert and oriented X3, cranial nerves II-XII grossly intact  Lab Results: Basic Metabolic Panel:  Recent Labs Lab 04/10/13 1154 04/11/13 0550  NA 133* 135  K 3.8 4.1  CL 99 102  CO2 24 26  GLUCOSE 265* 163*  BUN 8 8  CREATININE 0.67 0.59  CALCIUM 8.8 8.7   CBC:  Recent Labs Lab 04/10/13 1154 04/11/13 0550  WBC 10.7* 8.8  NEUTROABS 7.3 5.7  HGB 12.5* 11.7*  HCT 38.1* 37.3*  MCV 90.3  91.9  PLT 157 149*   CBG:  Recent Labs Lab 04/10/13 1814 04/10/13 2146 04/11/13 0811 04/11/13 1225  GLUCAP 199* 243* 176* 181*    Micro Results: Recent Results (from the past 240 hour(s))  CULTURE, ROUTINE-ABSCESS     Status: None   Collection Time    04/10/13  5:39 PM      Result Value Range Status   Specimen Description ABSCESS RIGHT BREAST   Final   Special Requests NONE   Final   Gram Stain PENDING   Incomplete   Culture     Final   Value: NO GROWTH     Performed at [facility]   Report Status PENDING   Incomplete  ANAEROBIC CULTURE     Status: None   Collection Time    04/10/13  5:39 PM      Result Value Range Status   Specimen Description ABSCESS RIGHT BREAST   Final   Special Requests NONE   Final   Gram Stain     Final   Value: NO WBC SEEN     NO SQUAMOUS EPITHELIAL CELLS SEEN     FEW GRAM POSITIVE COCCI     IN PAIRS  Performed at [facility]     Final   Value: NO ANAEROBES ISOLATED; CULTURE IN PROGRESS FOR 5 DAYS     Performed at [facility]   Report Status PENDING   Incomplete   Studies/Results: [place] Misc Soft Tissue  04/10/2013   *RADIOLOGY REPORT*  Clinical Data:  47 year old male with right breast swelling, pain and tenderness.  History of previous retroareolar abscesses. HIV positive.  RIGHT BREAST ULTRASOUND  Comparison:  None  Findings: Ultrasound is performed, showing a 4.4 x 4 x 4.3 cm complicated collection in the subareolar right breast without internal vascular flow - compatible with a abscess.  IMPRESSION: 4.4 x 4 x 4.3 cm complicated collection within the subareolar right breast compatible with a subareolar abscess.  Clinical follow-up / surgical consultation is recommended.   Original Report Authenticated By: Harmon Pier, M.D.   Medications: I have reviewed the patient's current medications. Scheduled Meds: . budesonide-formoterol  2 puff Inhalation BID  . dapsone  100 mg Oral Daily  . darunavir  600 mg  Oral BID WC  . docusate sodium  100 mg Oral BID  . emtricitabine-tenofovir  1 tablet Oral Daily  . heparin  5,000 Units Subcutaneous Q8H  . insulin aspart  0-9 Units Subcutaneous TID WC  . piperacillin-tazobactam (ZOSYN)  IV  3.375 g Intravenous Q8H  . ritonavir  100 mg Oral BID WC  . vancomycin  1,000 mg Intravenous Q8H   Continuous Infusions:  PRN Meds:.albuterol, albuterol, HYDROcodone-acetaminophen, ibuprofen Assessment/Plan:    Abscess of right breast - Explored operatively on 04/10/13 and on Vancomycin and Zosyn IV. Per surgery would like him stay to have packing removed tomorrow and continue with IV medications. He however would like like AMA today and did so immediately after administration of next IV medication dosage. He did leave quickly and we were unable to communicate with him about antibiotic script so it is unclear if or when he will follow up with the surgeon or his doctor.     HIV disease - Stable on HAART regimen and continue prezista, reyataz, norvir boost. Also continue dapsome for ppx given last CD4 130.     COPD/ still smoking - On home O2 and home regimen. Not in exacerbation and without acute respiratory complaints.     DM (diabetes mellitus) - Last HgA1c 6.2 and likely does not need insulin therapy and could use oral agent as monotherapy. On SSI here.    Dispo: Disposition is deferred at this time, awaiting improvement of current medical problems.  Anticipated discharge in approximately 0 day(s).   The patient does have a current PCP (Provider Default, MD) and does not need an [facility] hospital follow-up appointment after discharge.  The patient does not have transportation limitations that hinder transportation to clinic appointments.  .Services Needed at time of discharge: Y = Yes, Blank = No PT:   OT:   RN:   Equipment:   Other:     LOS: 1 day   Judie Bonus, MD 04/11/2013, 2:37 PM

## 2013-04-11 NOTE — H&P (Signed)
  Date: 04/11/2013  Patient name: Chris [name]  Medical record number: [id number]  Date of birth: 1965/12/28   I have [name] and evaluated Chris [name] and discussed their care with the Residency Team. Chris [name] was sitting side of bed. He is s/p I&D of the R breast abscess in the OR. The abscess was loculated and contained purulence. AM surgery note indicated that the surgical team wanted ABX (Vanc and Zosyn) continued and dressing changes in the AM. The pt states that he will not stay another night as tomorrow is his birthday. He states he can do dressing changes at home. His pain is well controlled.   He sees Dr Drue Second at [facility] and gets [facility] but doesn't like having to see different doctors each time. Dr Sherene Sires is his pulmonologist and he is on home O2 PRN and get DOE if he moves around too much.   Assessment and Plan: I have [name] and evaluated the patient as outlined above. I agree with the formulated Assessment and Plan as detailed in the residents' admission note, with the following changes:   1. R breast abscess - Cont IV ABX as long as pt remains inpt. Transition to PO once pt decides to leave. Wound care. Otherwise, per surgery recs.  2. HIV with h/o AIDS - cont home meds  3. DM II - pt states he was never  Tried on orals - has always been on insulin. Since only on Novolog meal coverage, likely could control DM with orals - to F/U with outpt PCP.  4. COPD and tobacco use - cont home meds. PT has been counseled to quit tobacco multiple times.  Pt likely will leave today regardless of our recs.  Burns Spain, MD 9/1/201410:34 AM

## 2013-04-11 NOTE — Discharge Summary (Signed)
Name: Chris [name] MRN: [id number] DOB: 1965/10/19 47 y.o. PCP: Provider Default, MD  Date of Admission: 04/10/2013  9:44 AM Date of Discharge: 04/11/2013 Attending Physician: No att. providers found  Discharge Diagnosis: Principal Problem:   Abscess of right breast Active Problems:   HIV disease   COPD/ still smoking    DM (diabetes mellitus)  Discharge Medications:  PATIENT LEFT AMA AND UNABLE TO PERFORM MED REC PRIOR TO LEAVING HOSPITAL.    Medication List    ASK your doctor about these medications       albuterol 108 (90 BASE) MCG/ACT inhaler  Commonly known as:  PROVENTIL HFA;VENTOLIN HFA  Inhale 2 puffs into the lungs every 6 (six) hours as needed for wheezing.     albuterol (5 MG/ML) 0.5% nebulizer solution  Commonly known as:  PROVENTIL  Take 2.5 mg by nebulization every 6 (six) hours as needed for wheezing.     budesonide-formoterol 160-4.5 MCG/ACT inhaler  Commonly known as:  SYMBICORT  Inhale 2 puffs into the lungs 2 (two) times daily.     dapsone 100 MG tablet  Take 100 mg by mouth daily.     darunavir 600 MG tablet  Commonly known as:  PREZISTA  Take 600 mg by mouth 2 (two) times daily with a meal.     emtricitabine-tenofovir 200-300 MG per tablet  Commonly known as:  TRUVADA  Take 1 tablet by mouth daily.     ibuprofen 800 MG tablet  Commonly known as:  ADVIL,MOTRIN  Take 800 mg by mouth 2 (two) times daily as needed for pain.     insulin aspart 100 UNIT/ML injection  Commonly known as:  novoLOG  Inject 5 Units into the skin 3 (three) times daily with meals.     ritonavir 100 MG capsule  Commonly known as:  NORVIR  Take 100 mg by mouth 2 (two) times daily.        Disposition and follow-up:   [name] LEFT AMA FROM THE HOSPITAL.  At the hospital follow up visit please address:  2.  Labs / imaging needed at time of follow-up: Wound care check for signs of infection or persistent drainage.  Follow-up Appointments: NONE, PATIENT LEFT  AMA  Discharge Instructions:      Future Appointments Provider Department Dept Phone   04/20/2013 11:30 AM [facility] Covering Provider [facility] [phone]   04/27/2013 11:00 AM [facility] Pft Room [facility] [phone]   04/27/2013 12:00 PM Nyoka Cowden, MD [facility] [phone]   06/01/2013 10:30 AM [facility] Lab [name] [phone]   06/16/2013 10:00 AM Judyann Munson, MD [facility] [phone]      Consultations: Treatment Team:  Md Ccs, MD  Procedures Performed:  [place] Misc Soft Tissue  04/10/2013   *RADIOLOGY REPORT*  Clinical Data:  47 year old male with right breast swelling, pain and tenderness.  History of previous retroareolar abscesses. HIV positive.  RIGHT BREAST ULTRASOUND  Comparison:  None  Findings: Ultrasound is performed, showing a 4.4 x 4 x 4.3 cm complicated collection in the subareolar right breast without internal vascular flow - compatible with a abscess.  IMPRESSION: 4.4 x 4 x 4.3 cm complicated collection within the subareolar right breast compatible with a subareolar abscess.  Clinical follow-up / surgical consultation is recommended.   Original Report Authenticated By: Harmon Pier, M.D.   Admission HPI:  [name] a 47 y.o.  y/o male w/ PMHx of HIV/AIDS, Hep C, DM, COPD presents to the ED w/ complaints of right breast pain and discharge for the past 2 weeks. He says that 2 weeks ago, the areolar area became tender and swollen and started to drain yellow/bloody fluid from the nipple. 1 week ago, the draining area close up and it has not drained since. At this time, the breast area is tender to the touch and fluctuant/hard to the touch w/ surrounding erythema. The underlying cystic mass/abscess is about 3-4 cm in size. The patient has had issues involving this in the past, dating back to almost 2 years ago when it started.  He has had it periodically incised and drained the last of which was in May in [facility] ED. He was sent home w/ appropriate ABx at that time. His prolactin levels were tested on 03/03/13 at the [facility] clinic for this recurrent issue, which returned as 4.9, wnl. He denies any other issues at this time. No fever, chills, nausea, vomiting, diarrhea, SOB, chest pain, palpitations, dizziness, lightheadedness, diaphoresis, night sweats, or weight loss. WBC count 10.7 on admisson, baseline is around 5.5. PMNs 71% (left shift), significant elevation for this patient. Afebrile, no tachycardia, SBP in the 100's.  The patient follows w/ Dr. Drue Second at [facility] and is currently taking Prezista, Truvada, and Norvir for his HIV. His last CD4 count and viral load were 130 and 20, respectively. Also taking Dapsone for PCP prophylaxis. The patient follows w/ Dr. Sherene Sires w/ Pulmonology for his COPD and uses supplemental O2 at home.   Hospital Course by problem list:    Abscess of right breast - Patient had operative I and D with wound left open to heal by secondary intention. He did receive Vancomycin and Zosyn while in the hospital. Cultures were sent and the wound was loculated with deep pockets of infection. It was recommended that he receive an additional day of antibiotics however he did not wish to stay. The surgeon was not able to be reached prior to patient's abrupt departure and wished to tailor therapy to cultures and wanted to continue IV antibiotics to cultures. Augmentin rx sent in to his pharmacy although unclear if patient will fill this medication.    HIV disease - Patient continued on meds while in-patient without event.    COPD/ still smoking -Patient maintained on home meds and O2 while in patient without event.    DM (diabetes mellitus) - Patient was maintained on SSI while in-patient without event. Last HgA1c was 6.2.  Discharge Vitals:   BP 128/74  Pulse 82  Temp(Src) 98.6 F (37 C) (Oral)  Resp 20  Ht 5'  9" (1.753 m)  Wt 240 lb (108.863 kg)  BMI 35.43 kg/m2  SpO2 94%  Discharge Labs:  Results for orders placed during the hospital encounter of 04/10/13 (from the past 24 hour(s))  CULTURE, ROUTINE-ABSCESS     Status: None   Collection Time    04/10/13  5:39 PM      Result Value Range   Specimen Description ABSCESS RIGHT BREAST     Special Requests NONE     Gram Stain PENDING     Culture       Value: NO GROWTH     Performed at [facility]   Report Status PENDING    ANAEROBIC CULTURE     Status: None   Collection Time    04/10/13  5:39 PM      Result Value Range  Specimen Description ABSCESS RIGHT BREAST     Special Requests NONE     Gram Stain PENDING     Culture       Value: NO ANAEROBES ISOLATED; CULTURE IN PROGRESS FOR 5 DAYS     Performed at [facility]   Report Status PENDING    GLUCOSE, CAPILLARY     Status: Abnormal   Collection Time    04/10/13  6:14 PM      Result Value Range   Glucose-Capillary 199 (*) 70 - 99 mg/dL   Comment 1 Notify RN    GLUCOSE, CAPILLARY     Status: Abnormal   Collection Time    04/10/13  9:46 PM      Result Value Range   Glucose-Capillary 243 (*) 70 - 99 mg/dL  CBC WITH DIFFERENTIAL     Status: Abnormal   Collection Time    04/11/13  5:50 AM      Result Value Range   WBC 8.8  4.0 - 10.5 K/uL   RBC 4.06 (*) 4.22 - 5.81 MIL/uL   Hemoglobin 11.7 (*) 13.0 - 17.0 g/dL   HCT 16.1 (*) 09.6 - 04.5 %   MCV 91.9  78.0 - 100.0 fL   MCH 28.8  26.0 - 34.0 pg   MCHC 31.4  30.0 - 36.0 g/dL   RDW 40.9  81.1 - 91.4 %   Platelets 149 (*) 150 - 400 K/uL   Neutrophils Relative % 65  43 - 77 %   Neutro Abs 5.7  1.7 - 7.7 K/uL   Lymphocytes Relative 25  12 - 46 %   Lymphs Abs 2.2  0.7 - 4.0 K/uL   Monocytes Relative 10  3 - 12 %   Monocytes Absolute 0.8  0.1 - 1.0 K/uL   Eosinophils Relative 1  0 - 5 %   Eosinophils Absolute 0.1  0.0 - 0.7 K/uL   Basophils Relative 0  0 - 1 %   Basophils Absolute 0.0  0.0 - 0.1 K/uL  BASIC  METABOLIC PANEL     Status: Abnormal   Collection Time    04/11/13  5:50 AM      Result Value Range   Sodium 135  135 - 145 mEq/L   Potassium 4.1  3.5 - 5.1 mEq/L   Chloride 102  96 - 112 mEq/L   CO2 26  19 - 32 mEq/L   Glucose, Bld 163 (*) 70 - 99 mg/dL   BUN 8  6 - 23 mg/dL   Creatinine, Ser 7.82  0.50 - 1.35 mg/dL   Calcium 8.7  8.4 - 95.6 mg/dL   GFR calc non Af Amer >90  >90 mL/min   GFR calc Af Amer >90  >90 mL/min  GLUCOSE, CAPILLARY     Status: Abnormal   Collection Time    04/11/13  8:11 AM      Result Value Range   Glucose-Capillary 176 (*) 70 - 99 mg/dL   Comment 1 Notify RN    GLUCOSE, CAPILLARY     Status: Abnormal   Collection Time    04/11/13 12:25 PM      Result Value Range   Glucose-Capillary 181 (*) 70 - 99 mg/dL   Comment 1 Notify RN      Signed: Judie Bonus, MD 04/11/2013, 1:10 PM   Time Spent on Discharge: 10 minutes Services Ordered on Discharge: none  Equipment Ordered on Discharge: none

## 2013-04-12 ENCOUNTER — Encounter (HOSPITAL_COMMUNITY): Payer: Self-pay | Admitting: General Surgery

## 2013-04-15 LAB — ANAEROBIC CULTURE: Gram Stain: NONE SEEN

## 2013-04-15 LAB — CULTURE, ROUTINE-ABSCESS

## 2013-04-18 LAB — CULTURE, BLOOD (ROUTINE X 2): Culture: NO GROWTH

## 2013-04-20 ENCOUNTER — Ambulatory Visit: Payer: No Typology Code available for payment source | Attending: Internal Medicine | Admitting: Internal Medicine

## 2013-04-20 VITALS — BP 125/85 | HR 74 | Temp 98.2°F | Resp 16 | Ht 68.0 in | Wt 248.0 lb

## 2013-04-20 DIAGNOSIS — J449 Chronic obstructive pulmonary disease, unspecified: Secondary | ICD-10-CM

## 2013-04-20 DIAGNOSIS — B20 Human immunodeficiency virus [HIV] disease: Secondary | ICD-10-CM | POA: Insufficient documentation

## 2013-04-20 DIAGNOSIS — M25569 Pain in unspecified knee: Secondary | ICD-10-CM

## 2013-04-20 DIAGNOSIS — E119 Type 2 diabetes mellitus without complications: Secondary | ICD-10-CM

## 2013-04-20 DIAGNOSIS — J4489 Other specified chronic obstructive pulmonary disease: Secondary | ICD-10-CM | POA: Insufficient documentation

## 2013-04-20 DIAGNOSIS — IMO0001 Reserved for inherently not codable concepts without codable children: Secondary | ICD-10-CM | POA: Insufficient documentation

## 2013-04-20 DIAGNOSIS — M25561 Pain in right knee: Secondary | ICD-10-CM

## 2013-04-20 MED ORDER — TRAMADOL HCL 50 MG PO TABS: 100.0000 mg | ORAL_TABLET | Freq: Four times a day (QID) | ORAL | Status: DC | PRN

## 2013-04-20 NOTE — Patient Instructions (Signed)
Chronic Obstructive Pulmonary Disease  Chronic obstructive pulmonary disease (COPD) is a condition in which airflow from the lungs is restricted. The lungs can never return to normal, but there are measures you can take which will improve them and make you feel better.  CAUSES    Smoking.   Exposure to secondhand smoke.   Breathing in irritants such as air pollution, dust, cigarette smoke, strong odors, aerosol sprays, or paint fumes.   History of lung infections.  SYMPTOMS    Deep, persistent (chronic) cough with a large amount of thick mucus.   Wheezing.   Shortness of breath, especially with physical activity.   Feeling like you cannot get enough air.   Difficulty breathing.   Rapid breaths (tachypnea).   Gray or bluish discoloration (cyanosis) of the skin, especially in fingers, toes, or lips.   Fatigue.   Weight loss.   Swelling in legs, ankles, or feet.   Fast heartbeat (tachycardia).   Frequent lung infections.    Chest tightness.  DIAGNOSIS   Initial diagnosis may be based on your history, symptoms, and physical examination. Additional tests for COPD may include:   Chest X-ray.   Computed tomography (CT) scan.   Lung (pulmonary) function tests.   Blood tests.  TREATMENT   Treatment focuses on making you comfortable (supportive care). Your caregiver may prescribe medicines (inhaled or pills) to help improve your breathing. Additional treatment options may include oxygen therapy and pulmonary rehabilitation. Treatment should also include reducing your exposure to known irritants and following a plan to stop smoking.  HOME CARE INSTRUCTIONS    Take all medicines, including antibiotic medicines, as directed by your caregiver.   Use inhaled medicines as directed by your caregiver.   Avoid medicines or cough syrups that dry up your airway (antihistamines) and slow down the elimination of secretions. This decreases respiratory capacity and may lead to infections.   If you smoke, stop  smoking.   Avoid exposure to smoke, chemicals, and fumes that aggravate your breathing.   Avoid contact with individuals that have a contagious illness.   Avoid extreme temperature and humidity changes.   Use humidifiers at home and at your bedside if they do not make breathing difficult.   Drink enough water and fluids to keep your urine clear or pale yellow. This loosens secretions.   Eat healthy foods. Eating smaller, more frequent meals and resting before meals may help you maintain your strength.   Ask your caregiver about the use of vitamins and mineral supplements.   Stay active. Exercise and physical activity will help maintain your ability to do things you want to do.   Balance activity with periods of rest.   Assume a position of comfort if you become short of breath.   Learn and use relaxation techniques.   Learn and use controlled breathing techniques as directed by your caregiver. Controlled breathing techniques include:   Pursed lip breathing. This breathing technique starts with breathing in (inhaling) through your nose for 1 second. Next, purse your lips as if you were going to whistle. Then breathe out (exhale) through the pursed lips for 2 seconds.   Diaphragmatic breathing. Start by putting one hand on your abdomen just above your waist. Inhale slowly through your nose. The hand on your abdomen should move out. Then exhale slowly through pursed lips. You should be able to feel the hand on your abdomen moving in as you exhale.   Learn and use controlled coughing to clear mucus from   your lungs. Controlled coughing is a series of short, progressive coughs. The steps of controlled coughing are:  1. Lean your head slightly forward.  2. Breathe in deeply using diaphragmatic breathing.  3. Try to hold your breath for 3 seconds.  4. Keep your mouth slightly open while coughing twice.  5. Spit any mucus out into a tissue.  6. Rest and repeat the steps once or twice as needed.   Receive all  protective vaccines your caregiver suggests, especially pneumococcal and influenza vaccines.   Learn to manage stress.   Schedule and attend all follow-up appointments as directed by your caregiver. It is important to keep all your appointments.   Participate in pulmonary rehabilitation as directed by your caregiver.   Use home oxygen as suggested.  SEEK MEDICAL CARE IF:    You are coughing up more mucus than usual.   There is a change in the color or thickness of the mucus.   Breathing is more labored than usual.   Your breathing is faster than usual.   Your skin color is more cyanotic than usual.   You are running out of the medicine you take for your breathing.   You are anxious, apprehensive, or restless.   You have a fever.  SEEK IMMEDIATE MEDICAL CARE IF:    You have a rapid heart rate.   You have shortness of breath while you are resting.   You have shortness of breath that prevents you from being able to talk.   You have shortness of breath that prevents you from performing your usual physical activities.   You have chest pain lasting longer than 5 minutes.   You have a seizure.   Your family or friends notice that you are agitated or confused.  MAKE SURE YOU:    Understand these instructions.   Will watch your condition.   Will get help right away if you are not doing well or get worse.  Document Released: 05/07/2005 Document Revised: 04/21/2012 Document Reviewed: 09/27/2010  [employer] Patient Information 2014 [employer].

## 2013-04-20 NOTE — Progress Notes (Signed)
Patient ID: Chris [name], male   DOB: Feb 26, 1966, 47 y.o.   MRN: [id number]  CC: Followup  HPI: 47 year old male with past medical history of HIV on HAART, COPD oxygen dependent on 2 L nasal cannula at home, diabetes who presented to clinic for followup. Patient needs referral to pain management to 2 pain in the knee and he had recent surgery on the right knee. He is able to ambulate but ran out of the pain medications. No chest pain or palpitations. Shortness of breath is present only on ambulation. No fevers or chills. He does have a chronic cough but no sputum production. No abdominal pain, nausea or vomiting.  Allergies  Allergen Reactions  . Bactrim [Sulfamethoxazole-Tmp Ds] Hives   Past Medical History  Diagnosis Date  . COPD (chronic obstructive pulmonary disease)   . HIV disease   . Emphysema   . Diabetes mellitus without complication    Current Outpatient Prescriptions on File Prior to Visit  Medication Sig Dispense Refill  . albuterol (PROVENTIL HFA;VENTOLIN HFA) 108 (90 BASE) MCG/ACT inhaler Inhale 2 puffs into the lungs every 6 (six) hours as needed for wheezing.      [name] albuterol (PROVENTIL) (5 MG/ML) 0.5% nebulizer solution Take 2.5 mg by nebulization every 6 (six) hours as needed for wheezing.      [name] amoxicillin-clavulanate (AUGMENTIN) 875-125 MG per tablet Take 1 tablet by mouth 2 (two) times daily.  30 tablet  0  . budesonide-formoterol (SYMBICORT) 160-4.5 MCG/ACT inhaler Inhale 2 puffs into the lungs 2 (two) times daily.      . dapsone 100 MG tablet Take 100 mg by mouth daily.      . darunavir (PREZISTA) 600 MG tablet Take 600 mg by mouth 2 (two) times daily with a meal.      . emtricitabine-tenofovir (TRUVADA) 200-300 MG per tablet Take 1 tablet by mouth daily.      . insulin aspart (NOVOLOG) 100 UNIT/ML injection Inject 5 Units into the skin 3 (three) times daily with meals.      . ritonavir (NORVIR) 100 MG capsule Take 100 mg by mouth 2 (two) times daily.      [name] ibuprofen  (ADVIL,MOTRIN) 800 MG tablet Take 800 mg by mouth 2 (two) times daily as needed for pain.       No current facility-administered medications on file prior to visit.   Family History  Problem Relation Age of Onset  . Emphysema Maternal Uncle     was a smoker  . Asthma Maternal Uncle   . Asthma Maternal Aunt   . Throat cancer Maternal Grandmother     never smoker, used snuff  . Breast cancer Maternal Aunt    History   Social History  . Marital Status: Married    Spouse Name: N/A    Number of Children: 0  . Years of Education: N/A   Occupational History  . Unemployed    Social History Main Topics  . Smoking status: Current Every Day Smoker -- 0.10 packs/day    Types: Cigarettes  . Smokeless tobacco: Never Used     Comment: Started smoking at age 64.  Currently smoking 2 cig per day.  . Alcohol Use: No  . Drug Use: No     Comment: quit 04  . Sexual Activity: Yes    Birth Control/ Protection: Condom   Other Topics Concern  . Not on file   Social History Narrative  . No narrative on file    Review of  Systems  Constitutional: Negative for fever, chills, diaphoresis, activity change, appetite change and fatigue.  HENT: Negative for ear pain, nosebleeds, congestion, facial swelling, rhinorrhea, neck pain, neck stiffness and ear discharge.   Eyes: Negative for pain, discharge, redness, itching and visual disturbance.  Respiratory: Has chronic cough, no palpitations. Shortness of breath is present with ambulation but no wheezing or stridor.   Cardiovascular: Negative for chest pain, palpitations and leg swelling.  Gastrointestinal: Negative for abdominal distention.  Genitourinary: Negative for dysuria, urgency, frequency, hematuria, flank pain, decreased urine volume, difficulty urinating and dyspareunia.  Musculoskeletal: Negative for back pain, joint swelling, arthralgias and gait problem.  Neurological: Negative for dizziness, tremors, seizures, syncope, facial asymmetry,  speech difficulty, weakness, light-headedness, numbness and headaches.  Hematological: Negative for adenopathy. Does not bruise/bleed easily.  Psychiatric/Behavioral: Negative for hallucinations, behavioral problems, confusion, dysphoric mood, decreased concentration and agitation.    Objective:   Filed Vitals:   04/20/13 1122  BP: 125/85  Pulse: 74  Temp:   Resp:     Physical Exam  Constitutional: Appears well-developed and well-nourished. No distress.  HENT: Normocephalic. External right and left ear normal. Oropharynx is clear and moist.  Eyes: Conjunctivae and EOM are normal. PERRLA, no scleral icterus.  Neck: Normal ROM. Neck supple. No JVD. No tracheal deviation. No thyromegaly.  CVS: RRR, S1/S2 +, no murmurs, no gallops, no carotid bruit.  Pulmonary: Effort and breath sounds normal, no stridor, rhonchi, wheezes, rales.  Abdominal: Soft. BS +,  no distension, tenderness, rebound or guarding.  Musculoskeletal: Normal range of motion. No edema and no tenderness.  Lymphadenopathy: No lymphadenopathy noted, cervical, inguinal. Neuro: Alert. Normal reflexes, muscle tone coordination. No cranial nerve deficit. Skin: Skin is warm and dry. No rash noted. Not diaphoretic. No erythema. No pallor.  Psychiatric: Normal mood and affect. Behavior, judgment, thought content normal.   Lab Results  Component Value Date   WBC 8.8 04/11/2013   HGB 11.7* 04/11/2013   HCT 37.3* 04/11/2013   MCV 91.9 04/11/2013   PLT 149* 04/11/2013   Lab Results  Component Value Date   CREATININE 0.59 04/11/2013   BUN 8 04/11/2013   NA 135 04/11/2013   K 4.1 04/11/2013   CL 102 04/11/2013   CO2 26 04/11/2013    Lab Results  Component Value Date   HGBA1C 6.2* 01/12/2013   Lipid Panel     Component Value Date/Time   CHOL 237* 12/06/2012 1037   TRIG 181* 12/06/2012 1037   HDL 32* 12/06/2012 1037   CHOLHDL 7.4 12/06/2012 1037   VLDL 36 12/06/2012 1037   LDLCALC 169* 12/06/2012 1037       Assessment and plan:    Patient Active Problem List   Diagnosis Date Noted  . Uncontrolled diabetes mellitus - A1c in June 2014 is 6.2  - Check A1c in December 2014 - Continue current insulin regimen  02/17/2013  .  COPD  - On 2 L nasal cannula, home oxygen  - Has pulmonologist who follows with him; next appointment later on this month  01/12/2013  . HIV disease - Patient follows with infectious disease clinic who prescribes medications 12/06/2012    Right knee pain - Prescription provided for tramadol as needed for pain relief  - Referral to pain management clinic provided  - Referral to orthopedic surgery provide

## 2013-04-20 NOTE — Progress Notes (Signed)
Pt is here today with a f/u visit Pt has COPD when he tries to go without oxygen he feels empty and tired. Pt reports having knee surgery Nov, 19, 2004. Which is giving him pain and loss of ROM. Pt also said that his lower back causes him pain. Pain scale a 7, sharp and unstable.

## 2013-04-25 ENCOUNTER — Telehealth: Payer: Self-pay | Admitting: *Deleted

## 2013-04-25 NOTE — Telephone Encounter (Signed)
Informed [facility], [address], [place] that azithromycin has been discontinued.  Pharmacist verbalized back information.

## 2013-04-25 NOTE — Telephone Encounter (Signed)
It is fine to stop his azithro based upon his recent cd 4 count but continue pcp prophylaxis

## 2013-04-25 NOTE — Telephone Encounter (Signed)
Another MD office discontinued the Azithromycin rx.  MD please advise about continuing or discontinuing this medication.  Fax request placed on MD computer for answer.

## 2013-04-27 ENCOUNTER — Ambulatory Visit: Payer: Self-pay | Admitting: Internal Medicine

## 2013-04-27 ENCOUNTER — Ambulatory Visit (INDEPENDENT_AMBULATORY_CARE_PROVIDER_SITE_OTHER): Payer: No Typology Code available for payment source | Admitting: Internal Medicine

## 2013-04-27 DIAGNOSIS — J9610 Chronic respiratory failure, unspecified whether with hypoxia or hypercapnia: Secondary | ICD-10-CM

## 2013-04-27 DIAGNOSIS — J449 Chronic obstructive pulmonary disease, unspecified: Secondary | ICD-10-CM

## 2013-04-27 LAB — PULMONARY FUNCTION TEST
DL/VA: 4.57 ml/min/mmHg/L
FEF2575-%Change-Post: 87 %
FEF2575-%Pred-Post: 26 %
FEV1-%Pred-Post: 44 %
FEV1-Post: 1.37 L
FEV1FVC-%Change-Post: 7 %
FEV1FVC-%Pred-Pre: 72 %
FEV6FVC-%Pred-Pre: 98 %
FVC-%Change-Post: 5 %
FVC-%Pred-Pre: 53 %
FVC-Post: 2.15 L
Post FEV1/FVC ratio: 63 %
Post FEV6/FVC ratio: 100 %
Pre FEV6/FVC Ratio: 96 %
RV % pred: 180 %
RV: 3.26 L
TLC % pred: 83 %
TLC: 5.31 L

## 2013-04-27 NOTE — Progress Notes (Signed)
PFT done today. 

## 2013-04-28 ENCOUNTER — Ambulatory Visit (INDEPENDENT_AMBULATORY_CARE_PROVIDER_SITE_OTHER): Payer: No Typology Code available for payment source | Admitting: Internal Medicine

## 2013-04-28 ENCOUNTER — Encounter: Payer: Self-pay | Admitting: Internal Medicine

## 2013-04-28 VITALS — BP 114/70 | HR 98 | Temp 98.5°F | Ht 69.0 in | Wt 250.6 lb

## 2013-04-28 DIAGNOSIS — J449 Chronic obstructive pulmonary disease, unspecified: Secondary | ICD-10-CM

## 2013-04-28 MED ORDER — TIOTROPIUM BROMIDE MONOHYDRATE 18 MCG IN CAPS: 18.0000 ug | ORAL_CAPSULE | Freq: Every day | RESPIRATORY_TRACT | Status: DC

## 2013-04-28 NOTE — Patient Instructions (Addendum)
Plan A = Automatic =  symbicort 160 Take 2 puffs first thing in am and then another 2 puffs about 12 hours later.  Add spiriva one capsule daily each am  Plan B = backup= Only use your albuterol (proiare) as a rescue medication to be used if you can't catch your breath by resting or doing a relaxed purse lip breathing pattern. The less you use it, the better it will work when you need it. Ok to use up to 2 puffs every 4 hours  Plan C = nebulizer, use this only if plan B doesn't work after 15 min ok to use up to 4 hours   The key is to stop smoking completely before smoking completely stops you!    If you are satisfied with your treatment plan let your doctor know and he/she can either refill your medications or you can return here when your prescription runs out.     If in any way you are not 100% satisfied,  please tell us.  If 100% better, tell your friends!

## 2013-04-28 NOTE — Progress Notes (Signed)
Subjective:    Patient ID: Chris [name], male    DOB: 02-10-1966   MRN: [id number]    Brief patient profile:  65 yobm smoker with HIV  and doe x 2009 much worse since March 2014 so referred by Chris [name] to pulmonary clinic 02/15/2013 with GOLD III copd by pfts 04/28/13.    HPI 02/15/2013 1st pulmonary eval  Cc indolent onset progressive doe x walking slow pace more than 100 ft,  not at rest., 02 dep, doe much worse x 3 months assoc with congested cough esp in am with sev tbsp thick white mucus takes about 30 min to clear.  No better on inhalers tried to date including advair and spiriva - albuterol works the best and using lots of saba and nebs rec Stop advair and spiriva Plan A = Automatic = Start symbicort 160 Take 2 puffs first thing in am and then another 2 puffs about 12 hours later.  Plan B = backup= Only use your albuterol (as a rescue medication to be used if you can't catch your breath by resting or doing a relaxed purse lip breathing pattern. The less you use it, the better it will work when you need it. Ok to use up to 2 puffs every 4 hours Plan C = nebulizer, use this only if plan B doesn't work ok to use up to 4 hours  03/15/2013 f/u ov/June Chris [name] still smoking  Chief Complaint  Patient presents with  . 4 wk follow up    Breathing worse at times.  Wheezing, chest tightness at times, and cough with yellow to clear mucus at times also reported.  Fatigues easily.   has not followed the previous instructions and symptoms have not really changed  rec Plan A = Automatic = Start symbicort 160 Take 2 puffs first thing in am and then another 2 puffs about 12 hours later.   Plan B = backup= Only use your albuterol (proiare) as a rescue medication to be used if you can't catch your breath by resting or doing a relaxed purse lip breathing pattern. The less you use it, the better it will work when you need it. Ok to use up to 2 puffs every 4 hours  Plan C = nebulizer, use this only if plan B doesn't  work after 15 min ok to use up to 4 hours   The key is to stop smoking completely before smoking completely stops you!    04/28/2013 f/u ov/Chris [name] still smoking  re:  GOLD III/ 02 dep @ 2lpm  Chief Complaint  Patient presents with  . Follow-up    Pt states that his breathing has improved, symbicort seems to be helping some. No new co's today. Using proair approx 4 times per wk.   still doe but has learned to pace himself and overall better but somewhat limited from desired activities even on his best days  No obvious day to day or daytime variabilty or assoc chronic cough or cp or chest tightness, subjective wheeze overt sinus or hb symptoms. No unusual exp hx or h/o childhood pna/ asthma or knowledge of premature birth.  Sleeping ok without nocturnal  or early am exacerbation  of respiratory  c/o's or need for noct saba. Also denies any obvious fluctuation of symptoms with weather or environmental changes or other aggravating or alleviating factors except as outlined above   Current Medications, Allergies, Complete Past Medical History, Past Surgical History, Family History, and Social History were reviewed in [employer]  electronic medical record.  ROS  The following are not active complaints unless bolded sore throat, dysphagia, dental problems, itching, sneezing,  nasal congestion or excess/ purulent secretions, ear ache,   fever, chills, sweats, unintended wt loss, pleuritic or exertional cp, hemoptysis,  orthopnea pnd or leg swelling, presyncope, palpitations, heartburn, abdominal pain, anorexia, nausea, vomiting, diarrhea  or change in bowel or urinary habits, change in stools or urine, dysuria,hematuria,  rash, arthralgias, visual complaints, headache, numbness weakness or ataxia or problems with walking or coordination,  change in mood/affect or memory.             Objective:   Physical Exam   amb bm nad    Wt Readings from Last 3 Encounters:  04/28/13 250 lb 9.6 oz  (113.671 kg)  04/20/13 248 lb (112.492 kg)  04/10/13 240 lb (108.863 kg)      HEENT mild turbinate edema.  Oropharynx no thrush or excess pnd or cobblestoning.  No JVD or cervical adenopathy. Mild accessory muscle hypertrophy. Trachea midline, nl thryroid. Chest was hyperinflated by percussion with diminished breath sounds and moderate increased exp time without wheeze. Hoover sign positive at mid inspiration. Regular rate and rhythm without murmur gallop or rub or increase P2 or edema.  Abd: no hsm, nl excursion. Ext warm without cyanosis or clubbing.     cxr 01/10/13  1. Mild hyperexpansion with peribronchial cuffing. Findings are  similar to the prior examination, and may reflect reactive airway  disease.      Assessment & Plan:

## 2013-04-30 NOTE — Assessment & Plan Note (Addendum)
-   hfa 75%  02/15/13 > 90% 03/15/2013  - 04/28/2013 PFT's FEV1 1.21 (39%) ratio 59 and 13% better p B2 and dlco 72 corrects to 102   DDX of  difficult airways managment all start with A and  include Adherence, Ace Inhibitors, Acid Reflux, Active Sinus Disease, Alpha 1 Antitripsin deficiency, Anxiety masquerading as Airways dz,  ABPA,  allergy(esp in young), Aspiration (esp in elderly), Adverse effects of DPI,  Active smokers, plus two Bs  = Bronchiectasis and Beta blocker use..and one C= CHF   Active smoking is the only remaining issue.  I reviewed the Flethcher curve with patient that basically indicates  if you quit smoking when your best day FEV1 is still well preserved it is highly unlikely you will progress to severe disease and informed the patient there was no medication on the market that has proven to change the curve or the likelihood of progression.  Therefore stopping smoking and maintaining abstinence is the most important aspect of care, not choice of inhalers or for that matter, doctors.    In meantime, ok to start spiriva trial to see if improves activity tolerance and if not should just use symbicort and focus on smoking cessation through his primary care provider  Pulmonary f/u can be prn

## 2013-05-10 ENCOUNTER — Encounter: Payer: Self-pay | Admitting: Family Medicine

## 2013-05-10 ENCOUNTER — Ambulatory Visit (INDEPENDENT_AMBULATORY_CARE_PROVIDER_SITE_OTHER): Payer: No Typology Code available for payment source | Admitting: Family Medicine

## 2013-05-10 VITALS — BP 139/80 | HR 91 | Ht 69.0 in | Wt 250.0 lb

## 2013-05-10 DIAGNOSIS — G8929 Other chronic pain: Secondary | ICD-10-CM

## 2013-05-10 DIAGNOSIS — M25569 Pain in unspecified knee: Secondary | ICD-10-CM

## 2013-05-10 MED ORDER — MELOXICAM 15 MG PO TABS: 15.0000 mg | ORAL_TABLET | Freq: Every day | ORAL | Status: DC

## 2013-05-10 NOTE — Patient Instructions (Signed)
Thank you for coming in today  We will get xrays of your knee and thigh bone Try meloxicam every morning with food, no ibuprofen, aleve, motrin, advil Ice your knee 3 x per day for 15 minutes
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
## 2013-05-20 ENCOUNTER — Ambulatory Visit (HOSPITAL_COMMUNITY)
Admission: RE | Admit: 2013-05-20 | Discharge: 2013-05-20 | Disposition: A | Discharge status: Home / Self Care | Payer: No Typology Code available for payment source | Source: Ambulatory Visit | Attending: Family Medicine | Admitting: Family Medicine

## 2013-05-20 ENCOUNTER — Ambulatory Visit (HOSPITAL_COMMUNITY)
Admission: RE | Admit: 2013-05-20 | Discharge: 2013-05-20 | Disposition: A | Discharge status: Home / Self Care | Payer: No Typology Code available for payment source | Source: Ambulatory Visit | Attending: Internal Medicine | Admitting: Internal Medicine

## 2013-05-20 ENCOUNTER — Other Ambulatory Visit: Payer: Self-pay | Admitting: Family Medicine

## 2013-05-20 ENCOUNTER — Ambulatory Visit: Payer: No Typology Code available for payment source | Attending: Internal Medicine | Admitting: Internal Medicine

## 2013-05-20 VITALS — BP 138/78 | HR 90 | Temp 97.9°F | Resp 15

## 2013-05-20 DIAGNOSIS — E1310 Other specified diabetes mellitus with ketoacidosis without coma: Secondary | ICD-10-CM

## 2013-05-20 DIAGNOSIS — M1710 Unilateral primary osteoarthritis, unspecified knee: Secondary | ICD-10-CM | POA: Insufficient documentation

## 2013-05-20 DIAGNOSIS — G8929 Other chronic pain: Secondary | ICD-10-CM

## 2013-05-20 DIAGNOSIS — M25569 Pain in unspecified knee: Secondary | ICD-10-CM | POA: Insufficient documentation

## 2013-05-20 DIAGNOSIS — R52 Pain, unspecified: Secondary | ICD-10-CM

## 2013-05-20 DIAGNOSIS — R0602 Shortness of breath: Secondary | ICD-10-CM | POA: Insufficient documentation

## 2013-05-20 DIAGNOSIS — E1110 Type 2 diabetes mellitus with ketoacidosis without coma: Secondary | ICD-10-CM

## 2013-05-20 DIAGNOSIS — M79609 Pain in unspecified limb: Secondary | ICD-10-CM | POA: Insufficient documentation

## 2013-05-20 LAB — GLUCOSE, POCT (MANUAL RESULT ENTRY): POC Glucose: 144 mg/dl — AB (ref 70–99)

## 2013-05-20 LAB — POCT GLYCOSYLATED HEMOGLOBIN (HGB A1C): Hemoglobin A1C: 7

## 2013-05-20 MED ORDER — ALBUTEROL SULFATE HFA 108 (90 BASE) MCG/ACT IN AERS: 2.0000 | INHALATION_SPRAY | Freq: Four times a day (QID) | RESPIRATORY_TRACT | Status: DC | PRN

## 2013-05-20 MED ORDER — BUDESONIDE-FORMOTEROL FUMARATE 160-4.5 MCG/ACT IN AERO: 2.0000 | INHALATION_SPRAY | Freq: Two times a day (BID) | RESPIRATORY_TRACT | Status: DC

## 2013-05-20 MED ORDER — TIOTROPIUM BROMIDE MONOHYDRATE 18 MCG IN CAPS: 18.0000 ug | ORAL_CAPSULE | Freq: Every day | RESPIRATORY_TRACT | Status: DC

## 2013-05-20 MED ORDER — ALBUTEROL SULFATE (5 MG/ML) 0.5% IN NEBU: 2.5000 mg | INHALATION_SOLUTION | Freq: Four times a day (QID) | RESPIRATORY_TRACT | Status: DC | PRN

## 2013-05-20 NOTE — Progress Notes (Signed)
Patient ID: Chris [name], male   DOB: 03/26/1966, 47 y.o.   MRN: [id number]   CC: Followup  HPI: Patient is 47 year old male with COPD, HIV, diabetes mellitus who is on oxygen at home and presents to clinic for followup. He explains he has been having more shortness of breath mostly with exertion but intermittently present at rest as well. This started one week prior to this visit and he tells me he had to use oxygen on continuous basis as he feels that his oxygen levels dropped. He denies chest pain but reports persistent cough that is mostly nonproductive but there have been a few episodes of productive cough with yellow phlegm. He denies fevers or chills, no recent sick contacts or exposures, he reports compliance with his medications. He endorses 2-3 pillow orthopnea and sleeps mostly in a recliner as he is unable to lay down flat. He reports he usually sleeps with one pillow maximum 2 pillows. He denies noticing weight gain or worsening lower extremity swelling.  Allergies  Allergen Reactions  . Bactrim [Sulfamethoxazole-Tmp Ds] Hives   Past Medical History  Diagnosis Date  . COPD (chronic obstructive pulmonary disease)   . HIV disease   . Emphysema   . Diabetes mellitus without complication    Current Outpatient Prescriptions on File Prior to Visit  Medication Sig Dispense Refill  . albuterol (PROVENTIL HFA;VENTOLIN HFA) 108 (90 BASE) MCG/ACT inhaler Inhale 2 puffs into the lungs every 6 (six) hours as needed for wheezing.      [name] albuterol (PROVENTIL) (5 MG/ML) 0.5% nebulizer solution Take 2.5 mg by nebulization every 6 (six) hours as needed for wheezing.      . budesonide-formoterol (SYMBICORT) 160-4.5 MCG/ACT inhaler Inhale 2 puffs into the lungs 2 (two) times daily.      . dapsone 100 MG tablet Take 100 mg by mouth daily.      . darunavir (PREZISTA) 600 MG tablet Take 600 mg by mouth 2 (two) times daily with a meal.      . emtricitabine-tenofovir (TRUVADA) 200-300 MG per tablet Take 1  tablet by mouth daily.      . insulin aspart (NOVOLOG) 100 UNIT/ML injection Inject 5 Units into the skin 3 (three) times daily with meals.      . meloxicam (MOBIC) 15 MG tablet Take 1 tablet (15 mg total) by mouth daily.  30 tablet  2  . ritonavir (NORVIR) 100 MG capsule Take 100 mg by mouth 2 (two) times daily.      [name] tiotropium (SPIRIVA) 18 MCG inhalation capsule Place 1 capsule (18 mcg total) into inhaler and inhale daily.  30 capsule  11  . traMADol (ULTRAM) 50 MG tablet Take 2 tablets (100 mg total) by mouth every 6 (six) hours as needed for pain.  90 tablet  0   No current facility-administered medications on file prior to visit.   Family History  Problem Relation Age of Onset  . Emphysema Maternal Uncle     was a smoker  . Asthma Maternal Uncle   . Asthma Maternal Aunt   . Throat cancer Maternal Grandmother     never smoker, used snuff  . Breast cancer Maternal Aunt    History   Social History  . Marital Status: Married    Spouse Name: N/A    Number of Children: 0  . Years of Education: N/A   Occupational History  . Unemployed    Social History Main Topics  . Smoking status: Current Every Day Smoker --  0.10 packs/day    Types: Cigarettes  . Smokeless tobacco: Never Used     Comment: Started smoking at age 80.  Currently smoking 2 cig per day.  . Alcohol Use: No  . Drug Use: No     Comment: quit 04  . Sexual Activity: Yes    Birth Control/ Protection: Condom   Other Topics Concern  . Not on file   Social History Narrative  . No narrative on file    Review of Systems  Constitutional: Negative for fever, chills, diaphoresis, activity change, appetite change and fatigue.  HENT: Negative for ear pain, nosebleeds, congestion, facial swelling, rhinorrhea, neck pain, neck stiffness and ear discharge.   Eyes: Negative for pain, discharge, redness, itching and visual disturbance.  Respiratory: Negative for choking, chest tightness, wheezing and stridor.    Cardiovascular: Negative for chest pain, palpitations and leg swelling.  Gastrointestinal: Negative for abdominal distention.  Genitourinary: Negative for dysuria, urgency, frequency, hematuria, flank pain, decreased urine volume, difficulty urinating and dyspareunia.  Musculoskeletal: Negative for back pain, joint swelling, arthralgias and gait problem.  Neurological: Negative for dizziness, tremors, seizures, syncope, facial asymmetry, speech difficulty, weakness, light-headedness, numbness and headaches.  Hematological: Negative for adenopathy. Does not bruise/bleed easily.  Psychiatric/Behavioral: Negative for hallucinations, behavioral problems, confusion, dysphoric mood, decreased concentration and agitation.    Objective:   Filed Vitals:   05/20/13 1045  BP: 138/78  Pulse: 90  Temp: 97.9 F (36.6 C)  Resp: 15    Physical Exam  Constitutional: Appears well-developed and well-nourished. No distress.  HENT: Normocephalic. External right and left ear normal. Oropharynx is clear and moist.  Eyes: Conjunctivae and EOM are normal. PERRLA, no scleral icterus.  Neck: Normal ROM. Neck supple. No JVD. No tracheal deviation. No thyromegaly.  CVS: RRR, S1/S2 +, no murmurs, no gallops, no carotid bruit.  Pulmonary: Effort and breath sounds normal, no stridor,   bibasilar crackles are notedAbdominal: Soft. BS +,  no distension, tenderness, rebound or guarding.  Musculoskeletal: Normal range of motion. +1 bilateral pitting edema  Lymphadenopathy: No lymphadenopathy noted, cervical, inguinal. Neuro: Alert. Normal reflexes, muscle tone coordination. No cranial nerve deficit. Skin: Skin is warm and dry. No rash noted. Not diaphoretic. No erythema. No pallor.  Psychiatric: Normal mood and affect. Behavior, judgment, thought content normal.   Lab Results  Component Value Date   WBC 8.8 04/11/2013   HGB 11.7* 04/11/2013   HCT 37.3* 04/11/2013   MCV 91.9 04/11/2013   PLT 149* 04/11/2013   Lab  Results  Component Value Date   CREATININE 0.59 04/11/2013   BUN 8 04/11/2013   NA 135 04/11/2013   K 4.1 04/11/2013   CL 102 04/11/2013   CO2 26 04/11/2013    Lab Results  Component Value Date   HGBA1C 7.0 05/20/2013   Lipid Panel     Component Value Date/Time   CHOL 237* 12/06/2012 1037   TRIG 181* 12/06/2012 1037   HDL 32* 12/06/2012 1037   CHOLHDL 7.4 12/06/2012 1037   VLDL 36 12/06/2012 1037   LDLCALC 169* 12/06/2012 1037       Assessment and plan:   Patient Active Problem List   Diagnosis Date Noted  . DM (diabetes mellitus) - A1c 7. Compliance discussed in detail and importance of regular sugar checks.  01/27/2013  . Chronic respiratory failure - secondary to COPD/emphysema. Mild crackles noted on exam today and nonproductive cough also noted. I will send patient for chest x-ray for further evaluation. I do  not see any 2-D echo on file and I'm not aware of diagnosis of congestive heart failure. Patient's symptoms could be related to CHF and if this persists have discussed with patient need for echocardiogram for further evaluation  01/12/2013  . COPD/ still smoking - smoking cessation discussed in detail. Patient reports he quit one week prior to this visit and  01/11/2013

## 2013-05-20 NOTE — Progress Notes (Signed)
Patient here for follow up-COPD DM

## 2013-05-20 NOTE — Patient Instructions (Signed)
Shortness of Breath Shortness of breath means you have trouble breathing. Shortness of breath may indicate that you have a medical problem. You should seek immediate medical care for shortness of breath. CAUSES   Not enough oxygen in the air (as with high altitudes or a smoke-filled room).  Short-term (acute) lung disease, including:  Infections, such as pneumonia.  Fluid in the lungs, such as heart failure.  A blood clot in the lungs (pulmonary embolism).  Long-term (chronic) lung diseases.  Heart disease (heart attack, angina, heart failure, and others).  Low red blood cells (anemia).  Poor physical fitness. This can cause shortness of breath when you exercise.  Chest or back injuries or stiffness.  Being overweight.  Smoking.  Anxiety. This can make you feel like you are not getting enough air. DIAGNOSIS  Serious medical problems can usually be found during your physical exam. Tests may also be done to determine why you are having shortness of breath. Tests may include:  Chest X-rays.  Lung function tests.  Blood tests.  Electrocardiography.  Exercise testing.  Echocardiography.  Imaging scans. Your caregiver may not be able to find a cause for your shortness of breath after your exam. In this case, it is important to have a follow-up exam with your caregiver as directed.  TREATMENT  Treatment for shortness of breath depends on the cause of your symptoms and can vary greatly. HOME CARE INSTRUCTIONS   Do not smoke. Smoking is a common cause of shortness of breath. If you smoke, ask for help to quit.  Avoid being around chemicals or things that may bother your breathing, such as paint fumes and dust.  Rest as needed. Slowly resume your usual activities.  If medicines were prescribed, take them as directed for the full length of time directed. This includes oxygen and any inhaled medicines.  Keep all follow-up appointments as directed by your caregiver. SEEK  MEDICAL CARE IF:   Your condition does not improve in the time expected.  You have a hard time doing your normal activities even with rest.  You have any side effects or problems with the medicines prescribed.  You develop any new symptoms. SEEK IMMEDIATE MEDICAL CARE IF:   Your shortness of breath gets worse.  You feel lightheaded, faint, or develop a cough not controlled with medicines.  You start coughing up blood.  You have pain with breathing.  You have chest pain or pain in your arms, shoulders, or abdomen.  You have a fever.  You are unable to walk up stairs or exercise the way you normally do. MAKE SURE YOU:  Understand these instructions.  Will watch your condition.  Will get help right away if you are not doing well or get worse. Document Released: 04/22/2001 Document Revised: 01/27/2012 Document Reviewed: 10/13/2011 [employer] Patient Information 2014 [employer].  

## 2013-05-24 ENCOUNTER — Encounter: Payer: Self-pay | Admitting: Family Medicine

## 2013-05-24 ENCOUNTER — Ambulatory Visit (INDEPENDENT_AMBULATORY_CARE_PROVIDER_SITE_OTHER): Payer: No Typology Code available for payment source | Admitting: Family Medicine

## 2013-05-24 VITALS — BP 134/80 | Ht 69.0 in | Wt 245.0 lb

## 2013-05-24 DIAGNOSIS — M25569 Pain in unspecified knee: Secondary | ICD-10-CM

## 2013-05-24 DIAGNOSIS — G8929 Other chronic pain: Secondary | ICD-10-CM

## 2013-05-24 MED ORDER — METHYLPREDNISOLONE ACETATE 40 MG/ML IJ SUSP
40.0000 mg | Freq: Once | INTRAMUSCULAR | Status: AC
  Administered 2013-05-24: 40 mg via INTRA_ARTICULAR
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
## 2013-05-24 NOTE — Patient Instructions (Signed)
Thank you for coming in today  Refer to physical therapy Knee injection today Try stationary bicycle Followup 1 month

## 2013-05-31 ENCOUNTER — Other Ambulatory Visit: Payer: Self-pay | Admitting: Internal Medicine

## 2013-05-31 DIAGNOSIS — J9610 Chronic respiratory failure, unspecified whether with hypoxia or hypercapnia: Secondary | ICD-10-CM

## 2013-06-01 ENCOUNTER — Other Ambulatory Visit: Payer: Self-pay

## 2013-06-02 ENCOUNTER — Encounter: Payer: Self-pay | Admitting: Licensed Clinical Social Worker

## 2013-06-02 ENCOUNTER — Ambulatory Visit: Payer: No Typology Code available for payment source | Attending: Family Medicine

## 2013-06-07 ENCOUNTER — Other Ambulatory Visit (INDEPENDENT_AMBULATORY_CARE_PROVIDER_SITE_OTHER): Payer: No Typology Code available for payment source

## 2013-06-07 ENCOUNTER — Other Ambulatory Visit: Payer: Self-pay | Admitting: *Deleted

## 2013-06-07 DIAGNOSIS — B20 Human immunodeficiency virus [HIV] disease: Secondary | ICD-10-CM

## 2013-06-07 LAB — CBC WITH DIFFERENTIAL/PLATELET
Basophils Absolute: 0 10*3/uL (ref 0.0–0.1)
Basophils Relative: 0 % (ref 0–1)
Eosinophils Absolute: 0.1 10*3/uL (ref 0.0–0.7)
Eosinophils Relative: 1 % (ref 0–5)
Hemoglobin: 14.1 g/dL (ref 13.0–17.0)
Lymphs Abs: 2.4 10*3/uL (ref 0.7–4.0)
MCH: 28.4 pg (ref 26.0–34.0)
MCHC: 32.7 g/dL (ref 30.0–36.0)
MCV: 86.9 fL (ref 78.0–100.0)
Monocytes Relative: 12 % (ref 3–12)
Neutrophils Relative %: 51 % (ref 43–77)
Platelets: 212 10*3/uL (ref 150–400)
RDW: 13.7 % (ref 11.5–15.5)

## 2013-06-07 LAB — COMPLETE METABOLIC PANEL WITH GFR
ALT: 17 U/L (ref 0–53)
Alkaline Phosphatase: 40 U/L (ref 39–117)
CO2: 27 mEq/L (ref 19–32)
Creat: 0.79 mg/dL (ref 0.50–1.35)
GFR, Est African American: >89 mL/min
Sodium: 137 mEq/L (ref 135–145)
Total Bilirubin: 0.4 mg/dL (ref 0.3–1.2)
Total Protein: 8.1 g/dL (ref 6.0–8.3)

## 2013-06-07 MED ORDER — EMTRICITABINE-TENOFOVIR DF 200-300 MG PO TABS: 1.0000 | ORAL_TABLET | Freq: Every day | ORAL | Status: DC

## 2013-06-07 MED ORDER — RITONAVIR 100 MG PO CAPS: 100.0000 mg | ORAL_CAPSULE | Freq: Two times a day (BID) | ORAL | Status: DC

## 2013-06-07 MED ORDER — DAPSONE 100 MG PO TABS: 100.0000 mg | ORAL_TABLET | Freq: Every day | ORAL | Status: DC

## 2013-06-07 MED ORDER — RALTEGRAVIR POTASSIUM 400 MG PO TABS: 400.0000 mg | ORAL_TABLET | Freq: Two times a day (BID) | ORAL | Status: DC

## 2013-06-07 MED ORDER — DARUNAVIR ETHANOLATE 600 MG PO TABS: 600.0000 mg | ORAL_TABLET | Freq: Two times a day (BID) | ORAL | Status: DC

## 2013-06-08 LAB — HIV-1 RNA QUANT-NO REFLEX-BLD
HIV 1 RNA Quant: <20 copies/mL (ref <20)
HIV-1 RNA Quant, Log: <1.3 {Log} (ref <1.30)

## 2013-06-08 LAB — T-HELPER CELL (CD4) - (RCID CLINIC ONLY)
CD4 % Helper T Cell: 5 % — ABNORMAL LOW (ref 33–55)
CD4 T Cell Abs: 120 /uL — ABNORMAL LOW (ref 400–2700)

## 2013-06-10 ENCOUNTER — Encounter: Payer: Self-pay | Admitting: Internal Medicine

## 2013-06-16 ENCOUNTER — Ambulatory Visit: Payer: Self-pay | Admitting: Internal Medicine

## 2013-06-28 ENCOUNTER — Encounter (INDEPENDENT_AMBULATORY_CARE_PROVIDER_SITE_OTHER): Payer: Self-pay

## 2013-06-28 ENCOUNTER — Ambulatory Visit: Payer: No Typology Code available for payment source | Admitting: Family Medicine

## 2013-07-12 ENCOUNTER — Ambulatory Visit (INDEPENDENT_AMBULATORY_CARE_PROVIDER_SITE_OTHER): Payer: No Typology Code available for payment source | Admitting: Internal Medicine

## 2013-07-12 ENCOUNTER — Encounter: Payer: Self-pay | Admitting: Internal Medicine

## 2013-07-12 VITALS — BP 132/83 | HR 95 | Temp 98.4°F | Wt 262.0 lb

## 2013-07-12 DIAGNOSIS — N61 Mastitis without abscess: Secondary | ICD-10-CM

## 2013-07-12 DIAGNOSIS — N611 Abscess of the breast and nipple: Secondary | ICD-10-CM

## 2013-07-12 MED ORDER — DOXYCYCLINE HYCLATE 100 MG PO TABS: 100.0000 mg | ORAL_TABLET | Freq: Two times a day (BID) | ORAL | Status: DC

## 2013-07-12 NOTE — Progress Notes (Signed)
Subjective:    Patient ID: Chris [name], male    DOB: [birth year], 47 y.o.   MRN: [id number]  HPI 47yo M with HIV, and other MMP, copd on 2LNC. Cd 4 count of 120/VL<20 (oct 2014), on RLG/DRVr BID/truvada Taking medications accordingly, no missing doses. Also on dapsone daily for pcp proph. In the last 10 days, Noticing increase swelling and discharge from left nipple again. Has dressing change QOD. No fever, chills, nightsweat  Current Outpatient Prescriptions on File Prior to Visit  Medication Sig Dispense Refill  . albuterol (PROVENTIL HFA;VENTOLIN HFA) 108 (90 BASE) MCG/ACT inhaler Inhale 2 puffs into the lungs every 6 (six) hours as needed for wheezing.  1 Inhaler  11  . albuterol (PROVENTIL) (5 MG/ML) 0.5% nebulizer solution Take 0.5 mLs (2.5 mg total) by nebulization every 6 (six) hours as needed for wheezing.  20 mL  11  . budesonide-formoterol (SYMBICORT) 160-4.5 MCG/ACT inhaler Inhale 2 puffs into the lungs 2 (two) times daily.  1 Inhaler  11  . dapsone 100 MG tablet Take 1 tablet (100 mg total) by mouth daily.  30 tablet  3  . darunavir (PREZISTA) 600 MG tablet Take 1 tablet (600 mg total) by mouth 2 (two) times daily with a meal.  60 tablet  3  . emtricitabine-tenofovir (TRUVADA) 200-300 MG per tablet Take 1 tablet by mouth daily.  30 tablet  3  . insulin aspart (NOVOLOG) 100 UNIT/ML injection Inject 5 Units into the skin 3 (three) times daily with meals.      . meloxicam (MOBIC) 15 MG tablet Take 1 tablet (15 mg total) by mouth daily.  30 tablet  2  . raltegravir (ISENTRESS) 400 MG tablet Take 1 tablet (400 mg total) by mouth 2 (two) times daily.  60 tablet  3  . ritonavir (NORVIR) 100 MG capsule Take 1 capsule (100 mg total) by mouth 2 (two) times daily.  60 capsule  3  . tiotropium (SPIRIVA) 18 MCG inhalation capsule Place 1 capsule (18 mcg total) into inhaler and inhale daily.  30 capsule  11  . traMADol (ULTRAM) 50 MG tablet Take 2 tablets (100 mg total) by mouth every 6 (six)  hours as needed for pain.  90 tablet  0   No current facility-administered medications on file prior to visit.   Active Ambulatory Problems    Diagnosis Date Noted  . HIV disease 12/06/2012  . COPD/ still smoking  01/11/2013  . Nicotine abuse 01/11/2013  . Steroid-induced hyperglycemia 01/12/2013  . Chronic respiratory failure 01/12/2013  . DM (diabetes mellitus) 01/27/2013  . Smoker 02/17/2013  . Hyperglycemia 02/17/2013  . Uncontrolled diabetes mellitus 02/17/2013   Resolved Ambulatory Problems    Diagnosis Date Noted  . No Resolved Ambulatory Problems   Past Medical History  Diagnosis Date  . COPD (chronic obstructive pulmonary disease)   . Emphysema   . Diabetes mellitus without complication        Review of Systems 10 point ROS is negative ,other than recurrent skin lesion to right breast    Objective:   Physical Exam BP 132/83  Pulse 95  Temp(Src) 98.4 F (36.9 C) (Oral)  Wt 262 lb (118.842 kg)  SpO2 93% gen = a xo by 4 and wearing [place] HEENT= PERRLA, EOMI, MMM, wearing [place] Chest wall = left nipple area has indurated oblong areas 4-5 cm long nad 2 cm wide, express nipple and whitish discharge Abd: NTND, obese contour, HSM  Assessment & Plan:  HIV = continue on current regimen:RLG/DRVr BID/truvada.   subaerolar abscess of right breast =appears to reoccur. Will send out culture. do a trial of doxy 100mg  BID x 10 days. If not improved by next  Visit, will do repeat u/s.  rtc in 2 wk

## 2013-07-14 IMAGING — CR DG CHEST 2V
4 series · 4 of 4 positions shown · non-contrast
Comparison: None.

CLINICAL DATA: Cough and shortness of breath.

CHEST - 2 VIEW

[view not recorded (1 of 4)]
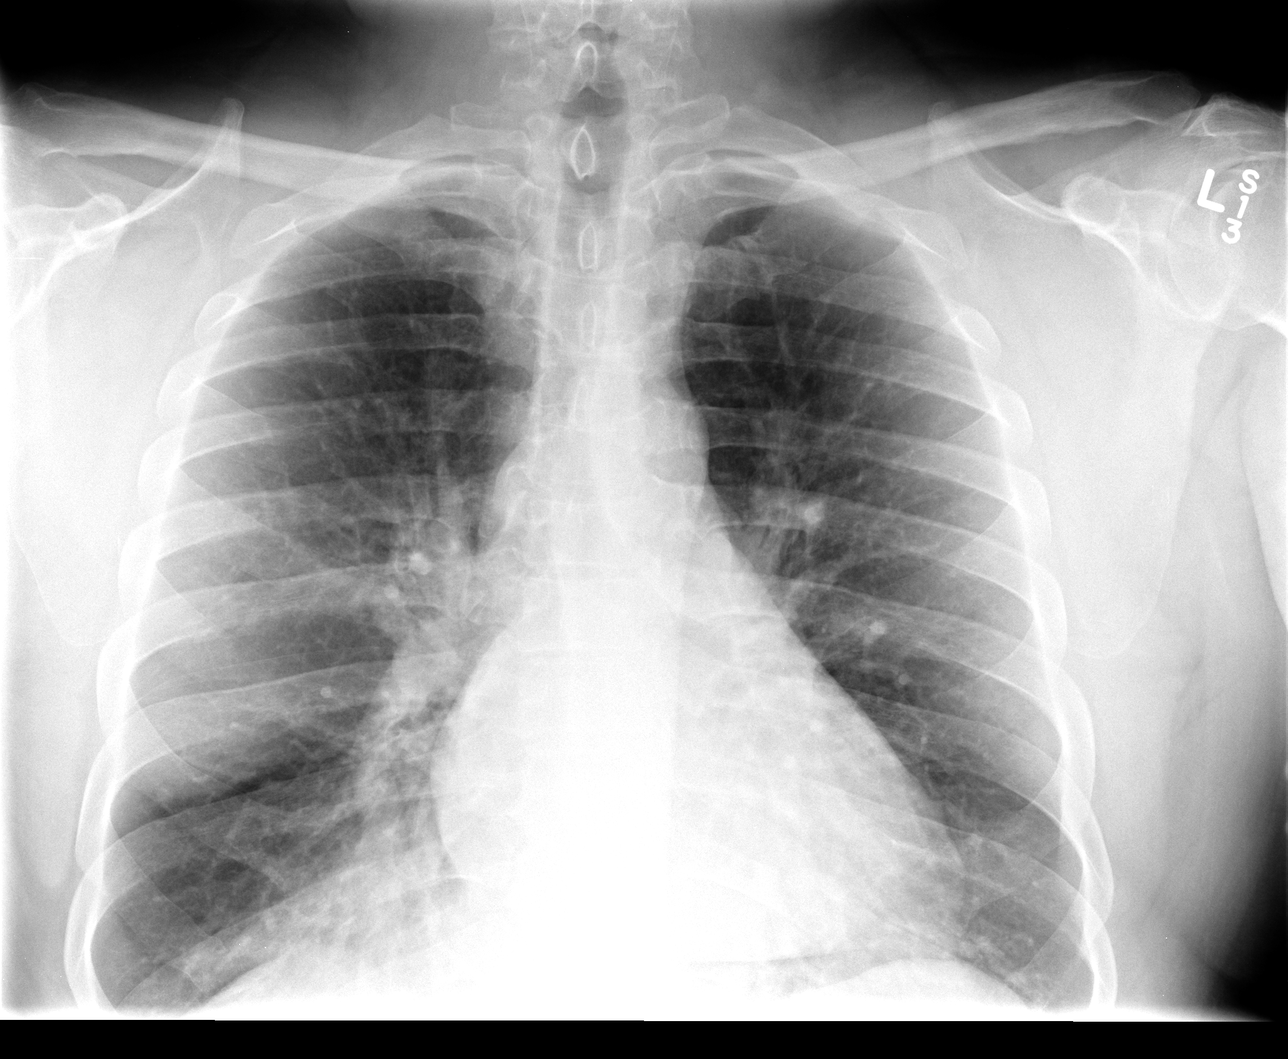

[view not recorded (2 of 4)]
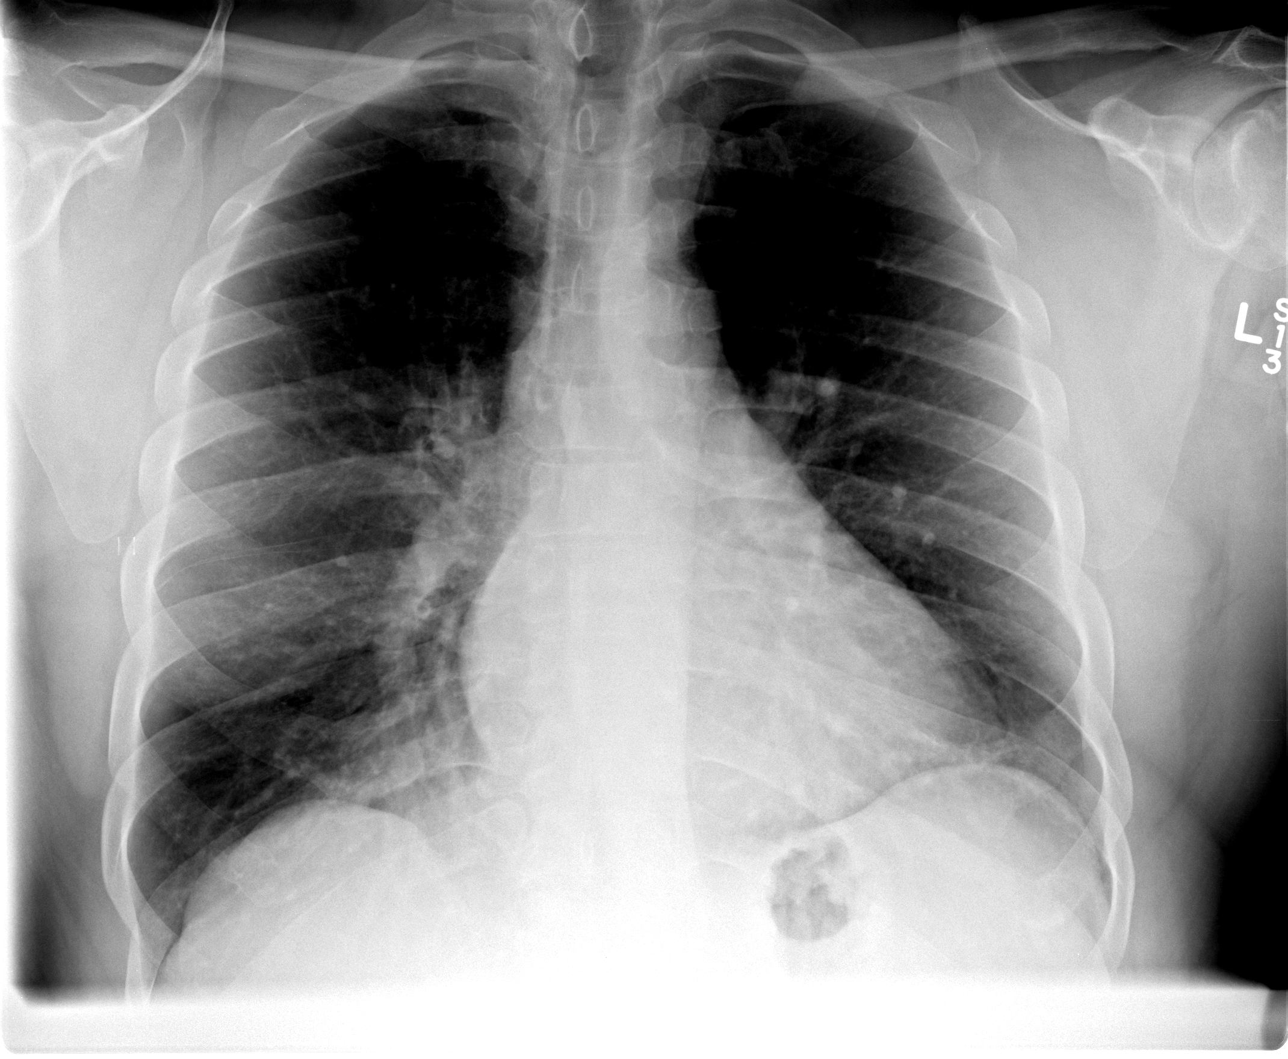

[view not recorded (3 of 4)]
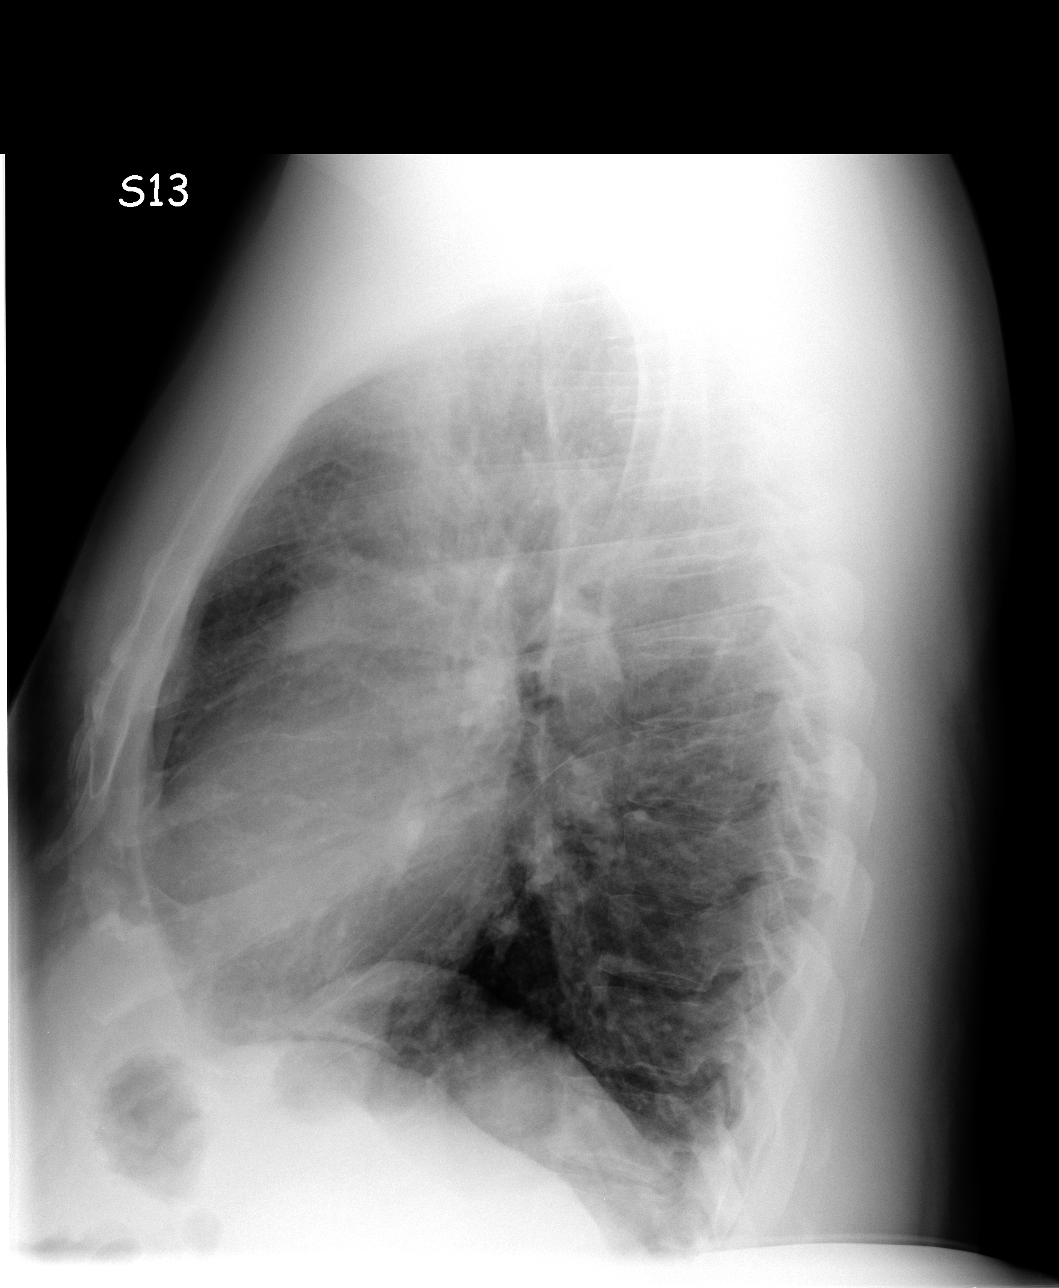

[view not recorded (4 of 4)]
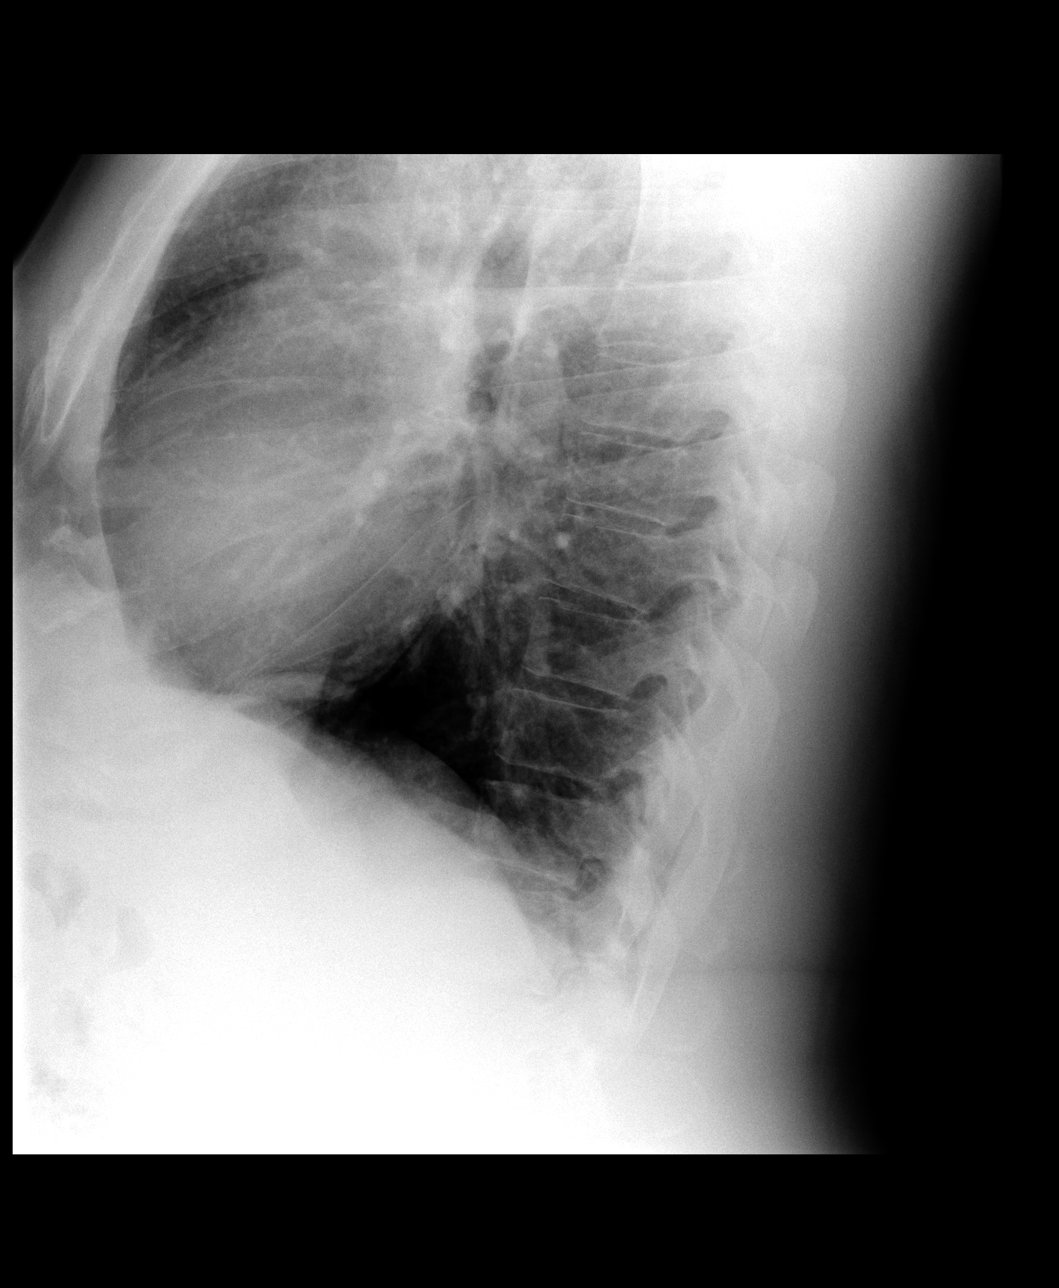

[4 of 4 positions shown; findings below may reference images not displayed]

FINDINGS: Trachea is midline.  Heart size normal.  Lungs are mildly
hyperinflated but clear.  No pleural fluid.
IMPRESSION: COPD without acute finding.

## 2013-07-15 LAB — WOUND CULTURE
Gram Stain: NONE SEEN
Gram Stain: NONE SEEN

## 2013-07-26 ENCOUNTER — Encounter: Payer: Self-pay | Admitting: Internal Medicine

## 2013-07-26 ENCOUNTER — Ambulatory Visit (INDEPENDENT_AMBULATORY_CARE_PROVIDER_SITE_OTHER): Payer: Medicaid Other | Admitting: Internal Medicine

## 2013-07-26 VITALS — BP 135/78 | HR 91 | Temp 97.9°F | Wt 263.0 lb

## 2013-07-26 DIAGNOSIS — N61 Mastitis without abscess: Secondary | ICD-10-CM

## 2013-07-26 DIAGNOSIS — N611 Abscess of the breast and nipple: Secondary | ICD-10-CM

## 2013-07-26 MED ORDER — DOXYCYCLINE HYCLATE 100 MG PO TABS: 100.0000 mg | ORAL_TABLET | Freq: Two times a day (BID) | ORAL | Status: DC

## 2013-07-26 NOTE — Progress Notes (Signed)
Subjective:    Patient ID: Chris [name], male    DOB: 11/02/1965, 47 y.o.   MRN: [id number]  HPI 47yo M with HIV, and other MMP, copd on 2LNC. Cd 4 count of 120/VL<20 (oct 2014), on RLG/DRVr BID/truvada Taking medications accordingly, no missing doses. Also on dapsone daily for pcp proph. He was [name] in early December for noticing increase swelling and discharge from left nipple again. Has dressing change QOD. No fever, chills, nightsweat. Gave him 10 day rx for doxycycline  Low back pain from 40 # in 8 months  Current Outpatient Prescriptions on File Prior to Visit  Medication Sig Dispense Refill  . albuterol (PROVENTIL HFA;VENTOLIN HFA) 108 (90 BASE) MCG/ACT inhaler Inhale 2 puffs into the lungs every 6 (six) hours as needed for wheezing.  1 Inhaler  11  . albuterol (PROVENTIL) (5 MG/ML) 0.5% nebulizer solution Take 0.5 mLs (2.5 mg total) by nebulization every 6 (six) hours as needed for wheezing.  20 mL  11  . budesonide-formoterol (SYMBICORT) 160-4.5 MCG/ACT inhaler Inhale 2 puffs into the lungs 2 (two) times daily.  1 Inhaler  11  . dapsone 100 MG tablet Take 1 tablet (100 mg total) by mouth daily.  30 tablet  3  . darunavir (PREZISTA) 600 MG tablet Take 1 tablet (600 mg total) by mouth 2 (two) times daily with a meal.  60 tablet  3  . doxycycline (VIBRA-TABS) 100 MG tablet Take 1 tablet (100 mg total) by mouth 2 (two) times daily.  20 tablet  0  . emtricitabine-tenofovir (TRUVADA) 200-300 MG per tablet Take 1 tablet by mouth daily.  30 tablet  3  . insulin aspart (NOVOLOG) 100 UNIT/ML injection Inject 5 Units into the skin 3 (three) times daily with meals.      . meloxicam (MOBIC) 15 MG tablet Take 1 tablet (15 mg total) by mouth daily.  30 tablet  2  . raltegravir (ISENTRESS) 400 MG tablet Take 1 tablet (400 mg total) by mouth 2 (two) times daily.  60 tablet  3  . ritonavir (NORVIR) 100 MG capsule Take 1 capsule (100 mg total) by mouth 2 (two) times daily.  60 capsule  3  . tiotropium  (SPIRIVA) 18 MCG inhalation capsule Place 1 capsule (18 mcg total) into inhaler and inhale daily.  30 capsule  11  . traMADol (ULTRAM) 50 MG tablet Take 2 tablets (100 mg total) by mouth every 6 (six) hours as needed for pain.  90 tablet  0   No current facility-administered medications on file prior to visit.   Active Ambulatory Problems    Diagnosis Date Noted  . HIV disease 12/06/2012  . COPD/ still smoking  01/11/2013  . Nicotine abuse 01/11/2013  . Steroid-induced hyperglycemia 01/12/2013  . Chronic respiratory failure 01/12/2013  . DM (diabetes mellitus) 01/27/2013  . Smoker 02/17/2013  . Hyperglycemia 02/17/2013  . Uncontrolled diabetes mellitus 02/17/2013   Resolved Ambulatory Problems    Diagnosis Date Noted  . No Resolved Ambulatory Problems   Past Medical History  Diagnosis Date  . COPD (chronic obstructive pulmonary disease)   . Emphysema   . Diabetes mellitus without complication      Review of Systems  Constitutional: Negative for fever, chills, diaphoresis, activity change, appetite change, fatigue and unexpected weight change.  HENT: Negative for congestion, sore throat, rhinorrhea, sneezing, trouble swallowing and sinus pressure.  Eyes: Negative for photophobia and visual disturbance.  Respiratory: Negative for cough, chest tightness, shortness of breath, wheezing and  stridor.  Cardiovascular: Negative for chest pain, palpitations and leg swelling.  Gastrointestinal: Negative for nausea, vomiting, abdominal pain, diarrhea, constipation, blood in stool, abdominal distention and anal bleeding.  Genitourinary: Negative for dysuria, hematuria, flank pain and difficulty urinating.  Musculoskeletal: Negative for myalgias, back pain, joint swelling, arthralgias and gait problem.  Skin: Negative for color change, pallor, rash and wound.  Neurological: Negative for dizziness, tremors, weakness and light-headedness.  Hematological: Negative for adenopathy. Does not  bruise/bleed easily.  Psychiatric/Behavioral: Negative for behavioral problems, confusion, sleep disturbance, dysphoric mood, decreased concentration and agitation.       Objective:   Physical Exam BP 135/78  Pulse 91  Temp(Src) 97.9 F (36.6 C) (Oral)  Wt 263 lb (119.296 kg)  SpO2 94% Physical Exam  Constitutional: He is oriented to person, place, and time. He appears well-developed and well-nourished. No distress.  HENT:  Mouth/Throat: Oropharynx is clear and moist. No oropharyngeal exudate.  Cardiovascular: Normal rate, regular rhythm and normal heart sounds. Exam reveals no gallop and no friction rub.  No murmur heard.  Pulmonary/Chest: Effort normal and breath sounds normal. No respiratory distress. He has no wheezes.  Abdominal: Soft. Bowel sounds are normal. He exhibits no distension. There is no tenderness.  Lymphadenopathy:  He has no cervical adenopathy.  Neurological: He is alert and oriented to person, place, and time.  Skin: Skin is warm and dry. No rash noted. No erythema.  Psychiatric: He has a normal mood and affect. His behavior is normal.        Assessment & Plan:  HIv = well controlled but CD 4 count < 200. Continue with current regimen. Will recheck labs in late January  oi proph = will continue on dapsone  Breast abscess= finished 10 day course of doxycycline  helath maintenance = already received flu vac  Nocturia = will refer him to his PCP to start medications for symptomatic relief  Copd = sees dr. Sherene Sires in Jan. For evaluation of oxygen needs, inhalers.

## 2013-08-23 ENCOUNTER — Ambulatory Visit: Payer: Medicaid Other | Attending: Internal Medicine | Admitting: Internal Medicine

## 2013-08-23 ENCOUNTER — Encounter: Payer: Self-pay | Admitting: Internal Medicine

## 2013-08-23 VITALS — BP 121/79 | HR 82 | Temp 98.2°F | Resp 14 | Ht 69.0 in | Wt 263.0 lb

## 2013-08-23 DIAGNOSIS — N611 Abscess of the breast and nipple: Secondary | ICD-10-CM | POA: Insufficient documentation

## 2013-08-23 DIAGNOSIS — B20 Human immunodeficiency virus [HIV] disease: Secondary | ICD-10-CM | POA: Insufficient documentation

## 2013-08-23 DIAGNOSIS — G4733 Obstructive sleep apnea (adult) (pediatric): Secondary | ICD-10-CM

## 2013-08-23 DIAGNOSIS — Z720 Tobacco use: Secondary | ICD-10-CM

## 2013-08-23 DIAGNOSIS — IMO0001 Reserved for inherently not codable concepts without codable children: Secondary | ICD-10-CM

## 2013-08-23 DIAGNOSIS — E119 Type 2 diabetes mellitus without complications: Secondary | ICD-10-CM | POA: Insufficient documentation

## 2013-08-23 DIAGNOSIS — N6009 Solitary cyst of unspecified breast: Secondary | ICD-10-CM

## 2013-08-23 DIAGNOSIS — IMO0002 Reserved for concepts with insufficient information to code with codable children: Secondary | ICD-10-CM

## 2013-08-23 DIAGNOSIS — J441 Chronic obstructive pulmonary disease with (acute) exacerbation: Secondary | ICD-10-CM

## 2013-08-23 DIAGNOSIS — F17200 Nicotine dependence, unspecified, uncomplicated: Secondary | ICD-10-CM

## 2013-08-23 DIAGNOSIS — E1165 Type 2 diabetes mellitus with hyperglycemia: Secondary | ICD-10-CM

## 2013-08-23 DIAGNOSIS — J9610 Chronic respiratory failure, unspecified whether with hypoxia or hypercapnia: Secondary | ICD-10-CM | POA: Insufficient documentation

## 2013-08-23 DIAGNOSIS — N61 Mastitis without abscess: Secondary | ICD-10-CM

## 2013-08-23 MED ORDER — BUDESONIDE-FORMOTEROL FUMARATE 160-4.5 MCG/ACT IN AERO: 2.0000 | INHALATION_SPRAY | Freq: Two times a day (BID) | RESPIRATORY_TRACT | Status: DC

## 2013-08-23 MED ORDER — ALBUTEROL SULFATE HFA 108 (90 BASE) MCG/ACT IN AERS: 2.0000 | INHALATION_SPRAY | Freq: Four times a day (QID) | RESPIRATORY_TRACT | Status: DC | PRN

## 2013-08-23 MED ORDER — TRAMADOL HCL 50 MG PO TABS: 100.0000 mg | ORAL_TABLET | Freq: Four times a day (QID) | ORAL | Status: DC | PRN

## 2013-08-23 MED ORDER — DOXYCYCLINE HYCLATE 100 MG PO TABS: 100.0000 mg | ORAL_TABLET | Freq: Two times a day (BID) | ORAL | Status: DC

## 2013-08-23 NOTE — Patient Instructions (Signed)
Chronic Obstructive Pulmonary Disease  Chronic obstructive pulmonary disease (COPD) is a common lung condition in which airflow from the lungs is limited. COPD is a general term that can be used to describe many different lung problems that limit airflow, including both chronic bronchitis and emphysema.  If you have COPD, your lung function will probably never return to normal, but there are measures you can take to improve lung function and make yourself feel better.   CAUSES   · Smoking (common).    · Exposure to secondhand smoke.    · Genetic problems.  · Chronic inflammatory lung diseases or recurrent infections.  SYMPTOMS   · Shortness of breath, especially with physical activity.    · Deep, persistent (chronic) cough with a large amount of thick mucus.    · Wheezing.    · Rapid breaths (tachypnea).    · Gray or bluish discoloration (cyanosis) of the skin, especially in fingers, toes, or lips.    · Fatigue.    · Weight loss.    · Frequent infections or episodes when breathing symptoms become much worse (exacerbations).    · Chest tightness.  DIAGNOSIS   Your healthcare provider will take a medical history and perform a physical examination to make the initial diagnosis.  Additional tests for COPD may include:   · Lung (pulmonary) function tests.  · Chest X-ray.  · CT scan.  · Blood tests.  TREATMENT   Treatment available to help you feel better when you have COPD include:   · Inhaler and nebulizer medicines. These help manage the symptoms of COPD and make your breathing more comfortable  · Supplemental oxygen. Supplemental oxygen is only helpful if you have a low oxygen level in your blood.    · Exercise and physical activity. These are beneficial for nearly all people with COPD. Some people may also benefit from a pulmonary rehabilitation program.  HOME CARE INSTRUCTIONS   · Take all medicines (inhaled or pills) as directed by your health care provider.  · Only take over-the-counter or prescription medicines  for pain, fever, or discomfort as directed by your health care provider.    · Avoid over-the-counter medicines or cough syrups that dry up your airway (such as antihistamines) and slow down the elimination of secretions unless instructed otherwise by your healthcare provider.    · If you are a smoker, the most important thing that you can do is stop smoking. Continuing to smoke will cause further lung damage and breathing trouble. Ask your health care provider for help with quitting smoking. He or she can direct you to community resources or hospitals that provide support.  · Avoid exposure to irritants such as smoke, chemicals, and fumes that aggravate your breathing.  · Use oxygen therapy and pulmonary rehabilitation if directed by your health care provider. If you require home oxygen therapy, ask your healthcare provider whether you should purchase a pulse oximeter to measure your oxygen level at home.    · Avoid contact with individuals who have a contagious illness.  · Avoid extreme temperature and humidity changes.  · Eat healthy foods. Eating smaller, more frequent meals and resting before meals may help you maintain your strength.  · Stay active, but balance activity with periods of rest. Exercise and physical activity will help you maintain your ability to do things you want to do.  · Preventing infection and hospitalization is very important when you have COPD. Make sure to receive all the vaccines your health care provider recommends, especially the pneumococcal and influenza vaccines. Ask your healthcare provider whether you   need a pneumonia vaccine.  · Learn and use relaxation techniques to manage stress.  · Learn and use controlled breathing techniques as directed by your health care provider. Controlled breathing techniques include:    · Pursed lip breathing. Start by breathing in (inhaling) through your nose for 1 second. Then, purse your lips as if you were going to whistle and breathe out (exhale)  through the pursed lips for 2 seconds.    · Diaphragmatic breathing. Start by putting one hand on your abdomen just above your waist. Inhale slowly through your nose. The hand on your abdomen should move out. Then purse your lips and exhale slowly. You should be able to feel the hand on your abdomen moving in as you exhale.    · Learn and use controlled coughing to clear mucus from your lungs. Controlled coughing is a series of short, progressive coughs. The steps of controlled coughing are:    1. Lean your head slightly forward.    2. Breathe in deeply using diaphragmatic breathing.    3. Try to hold your breath for 3 seconds.    4. Keep your mouth slightly open while coughing twice.    5. Spit any mucus out into a tissue.    6. Rest and repeat the steps once or twice as needed.  SEEK MEDICAL CARE IF:   · You are coughing up more mucus than usual.    · There is a change in the color or thickness of your mucus.    · Your breathing is more labored than usual.    · Your breathing is faster than usual.    SEEK IMMEDIATE MEDICAL CARE IF:   · You have shortness of breath while you are resting.    · You have shortness of breath that prevents you from:  · Being able to talk.    · Performing your usual physical activities.    · You have chest pain lasting longer than 5 minutes.    · Your skin color is more cyanotic than usual.  · You measure low oxygen saturations for longer than 5 minutes with a pulse oximeter.  MAKE SURE YOU:   · Understand these instructions.  · Will watch your condition.  · Will get help right away if you are not doing well or get worse.  Document Released: 05/07/2005 Document Revised: 05/18/2013 Document Reviewed: 03/24/2013  [employer]® Patient Information ©2014 [employer].

## 2013-08-23 NOTE — Progress Notes (Signed)
Patient ID: Chris [name], male   DOB: [birth year], 48 y.o.   MRN: [id number] Patient Demographics  Chris [name], is a 48 y.o. male  WUJ:[id number]  [id number]  DOB - 03-05-1966  Chief Complaint  Patient presents with  . Follow-up        Subjective:   Chris [name] a 48 y.o. male here today for a follow up visit. Patient is known to have COPD, HIV, diabetes mellitus who is on oxygen at home. He was recently seen in the ED for breast abscess status post incision and drainage, he completed a course of doxycycline but the abscess continues to leak pus. Patient is doing well on his HIV medications, reports no side effects. Denies fever, no chest pain. He claims his blood sugar is well controlled at home, he needs a refill on his inhalers for COPD. He has not done sleep study before but on questioning he agrees that he snores loudly and may have sleep apnea. He continues to smoke cigarette but said he is reducing the quantity smoked per day, but denies the use of alcohol. Immunizations up-to-date Patient has No headache, No chest pain, No abdominal pain - No Nausea, No new weakness tingling or numbness, No Cough - SOB.  ALLERGIES: Allergies  Allergen Reactions  . Bactrim [Sulfamethoxazole-Tmp Ds] Hives    PAST MEDICAL HISTORY: Past Medical History  Diagnosis Date  . COPD (chronic obstructive pulmonary disease)   . HIV disease   . Emphysema   . Diabetes mellitus without complication     MEDICATIONS AT HOME: Prior to Admission medications   Medication Sig Start Date End Date Taking? Authorizing Provider  albuterol (PROVENTIL HFA;VENTOLIN HFA) 108 (90 BASE) MCG/ACT inhaler Inhale 2 puffs into the lungs every 6 (six) hours as needed for wheezing. 08/23/13  Yes Jeanann Lewandowsky, MD  albuterol (PROVENTIL) (5 MG/ML) 0.5% nebulizer solution Take 0.5 mLs (2.5 mg total) by nebulization every 6 (six) hours as needed for wheezing. 05/20/13  Yes Dorothea Ogle, MD  budesonide-formoterol [facility])  160-4.5 MCG/ACT inhaler Inhale 2 puffs into the lungs 2 (two) times daily. 08/23/13  Yes Jeanann Lewandowsky, MD  dapsone 100 MG tablet Take 1 tablet (100 mg total) by mouth daily. 06/07/13  Yes Judyann Munson, MD  darunavir (PREZISTA) 600 MG tablet Take 1 tablet (600 mg total) by mouth 2 (two) times daily with a meal. 06/07/13  Yes Judyann Munson, MD  doxycycline (VIBRA-TABS) 100 MG tablet Take 1 tablet (100 mg total) by mouth 2 (two) times daily. 08/23/13  Yes Jeanann Lewandowsky, MD  emtricitabine-tenofovir (TRUVADA) 200-300 MG per tablet Take 1 tablet by mouth daily. 06/07/13  Yes Judyann Munson, MD  raltegravir (ISENTRESS) 400 MG tablet Take 1 tablet (400 mg total) by mouth 2 (two) times daily. 06/07/13  Yes Judyann Munson, MD  ritonavir (NORVIR) 100 MG capsule Take 1 capsule (100 mg total) by mouth 2 (two) times daily. 06/07/13  Yes Judyann Munson, MD  tiotropium (SPIRIVA) 18 MCG inhalation capsule Place 1 capsule (18 mcg total) into inhaler and inhale daily. 05/20/13  Yes Dorothea Ogle, MD  insulin aspart (NOVOLOG) 100 UNIT/ML injection Inject 5 Units into the skin 3 (three) times daily with meals.    Historical Provider, MD  meloxicam (MOBIC) 15 MG tablet Take 1 tablet (15 mg total) by mouth daily. 05/10/13   Daine Gip, MD  traMADol (ULTRAM) 50 MG tablet Take 2 tablets (100 mg total) by mouth every 6 (six) hours as needed. 08/23/13   Kaytelynn Scripter  Hyman HopesJegede, MD     Objective:   Filed Vitals:   08/23/13 0938  BP: 121/79  Pulse: 82  Temp: 98.2 F (36.8 C)  TempSrc: Oral  Resp: 14  Height: 5\' 9"  (1.753 m)  Weight: 263 lb (119.296 kg)  SpO2: 90%    Exam General appearance : Awake, alert, not in any distress. Speech Clear. Not toxic looking, on oxygen by nasal cannula HEENT: Atraumatic and Normocephalic, pupils equally reactive to light and accomodation Neck: supple, no JVD. No cervical lymphadenopathy.  Chest: Reduced air entry bilaterally, no added sounds. Right breast swelling and dripping  serosanguineous fluid, sample taken for culture CVS: S1 S2 regular, no murmurs.  Abdomen: Bowel sounds present, Non tender and not distended with no gaurding, rigidity or rebound. Extremities: B/L Lower Ext shows no edema, both legs are warm to touch Neurology: Awake alert, and oriented X 3, CN II-XII intact, Non focal Skin:No Rash Wounds:N/A   Data Review   CBC No results found for this basename: WBC, HGB, HCT, PLT, MCV, MCH, MCHC, RDW, NEUTRABS, LYMPHSABS, MONOABS, EOSABS, BASOSABS, BANDABS, BANDSABD,  in the last 168 hours  Chemistries   No results found for this basename: NA, K, CL, CO2, GLUCOSE, BUN, CREATININE, GFRCGP, CALCIUM, MG, AST, ALT, ALKPHOS, BILITOT,  in the last 168 hours ------------------------------------------------------------------------------------------------------------------ No results found for this basename: HGBA1C,  in the last 72 hours ------------------------------------------------------------------------------------------------------------------ No results found for this basename: CHOL, HDL, LDLCALC, TRIG, CHOLHDL, LDLDIRECT,  in the last 72 hours ------------------------------------------------------------------------------------------------------------------ No results found for this basename: TSH, T4TOTAL, FREET3, T3FREE, THYROIDAB,  in the last 72 hours ------------------------------------------------------------------------------------------------------------------ No results found for this basename: VITAMINB12, FOLATE, FERRITIN, TIBC, IRON, RETICCTPCT,  in the last 72 hours  Coagulation profile  No results found for this basename: INR, PROTIME,  in the last 168 hours    Assessment & Plan   1. COPD exacerbation On home oxygen Refill - budesonide-formoterol (SYMBICORT) 160-4.5 MCG/ACT inhaler; Inhale 2 puffs into the lungs 2 (two) times daily.  Dispense: 2 Inhaler; Refill: 4 - albuterol (PROVENTIL HFA;VENTOLIN HFA) 108 (90 BASE) MCG/ACT  inhaler; Inhale 2 puffs into the lungs every 6 (six) hours as needed for wheezing.  Dispense: 2 Inhaler; Refill: 4  2. DM (diabetes mellitus) Due for hemoglobin A1c next visit  3. HIV disease Continue followup with infectious disease Continue current regimen  4. Uncontrolled diabetes mellitus Patient counseled extensively about nutrition and exercise To continue current medications  5. Chronic respiratory failure Patient counseled about smoking cessation Continue home oxygen  6. Nicotine abuse Vision extensively counseled about smoking cessation  7. Breast abscess Swab sample sent for Microscopy Culture and Sensitivity  - traMADol (ULTRAM) 50 MG tablet; Take 2 tablets (100 mg total) by mouth every 6 (six) hours as needed.  Dispense: 90 tablet; Refill: 0 - doxycycline (VIBRA-TABS) 100 MG tablet; Take 1 tablet (100 mg total) by mouth 2 (two) times daily.  Dispense: 28 tablet; Refill: 0  8. OSA (obstructive sleep apnea)  - Split night study; Future  Follow up in 3 months or when necessary   The patient was given clear instructions to go to ER or return to medical center if symptoms don't improve, worsen or new problems develop. The patient verbalized understanding. The patient was told to call to get lab results if they haven't heard anything in the next week.    Jeanann LewandowskyJEGEDE, Kalani Sthilaire, MD, MHA, FACP, FAAP [facility] [place], [place] [phone]   08/23/2013, 10:49 AM

## 2013-08-23 NOTE — Progress Notes (Signed)
Pt is here following up on his COPD and Diabetes. Pt reports having a cyst under his right nipple that is leaking.

## 2013-08-26 ENCOUNTER — Telehealth: Payer: Self-pay

## 2013-08-26 LAB — WOUND CULTURE
GRAM STAIN: NONE SEEN
Gram Stain: NONE SEEN
ORGANISM ID, BACTERIA: NO GROWTH

## 2013-08-26 NOTE — Telephone Encounter (Signed)
Patient not available Unable to leave message 

## 2013-08-26 NOTE — Telephone Encounter (Signed)
Message copied by Lestine MountJUAREZ, Patrese Neal L on Fri Aug 26, 2013  5:19 PM ------      Message from: Quentin AngstJEGEDE, OLUGBEMIGA E      Created: Fri Aug 26, 2013  3:59 PM       Please inform patient that his culture report is negative ------

## 2013-09-15 ENCOUNTER — Ambulatory Visit (HOSPITAL_BASED_OUTPATIENT_CLINIC_OR_DEPARTMENT_OTHER): Payer: Medicaid Other | Attending: Internal Medicine

## 2013-09-27 IMAGING — CR DG CHEST 2V
2 series · 2 of 2 positions shown · non-contrast
Comparison: 09/30/2012

CLINICAL DATA: Shortness of breath

CHEST - 2 VIEW

[w chest pa]
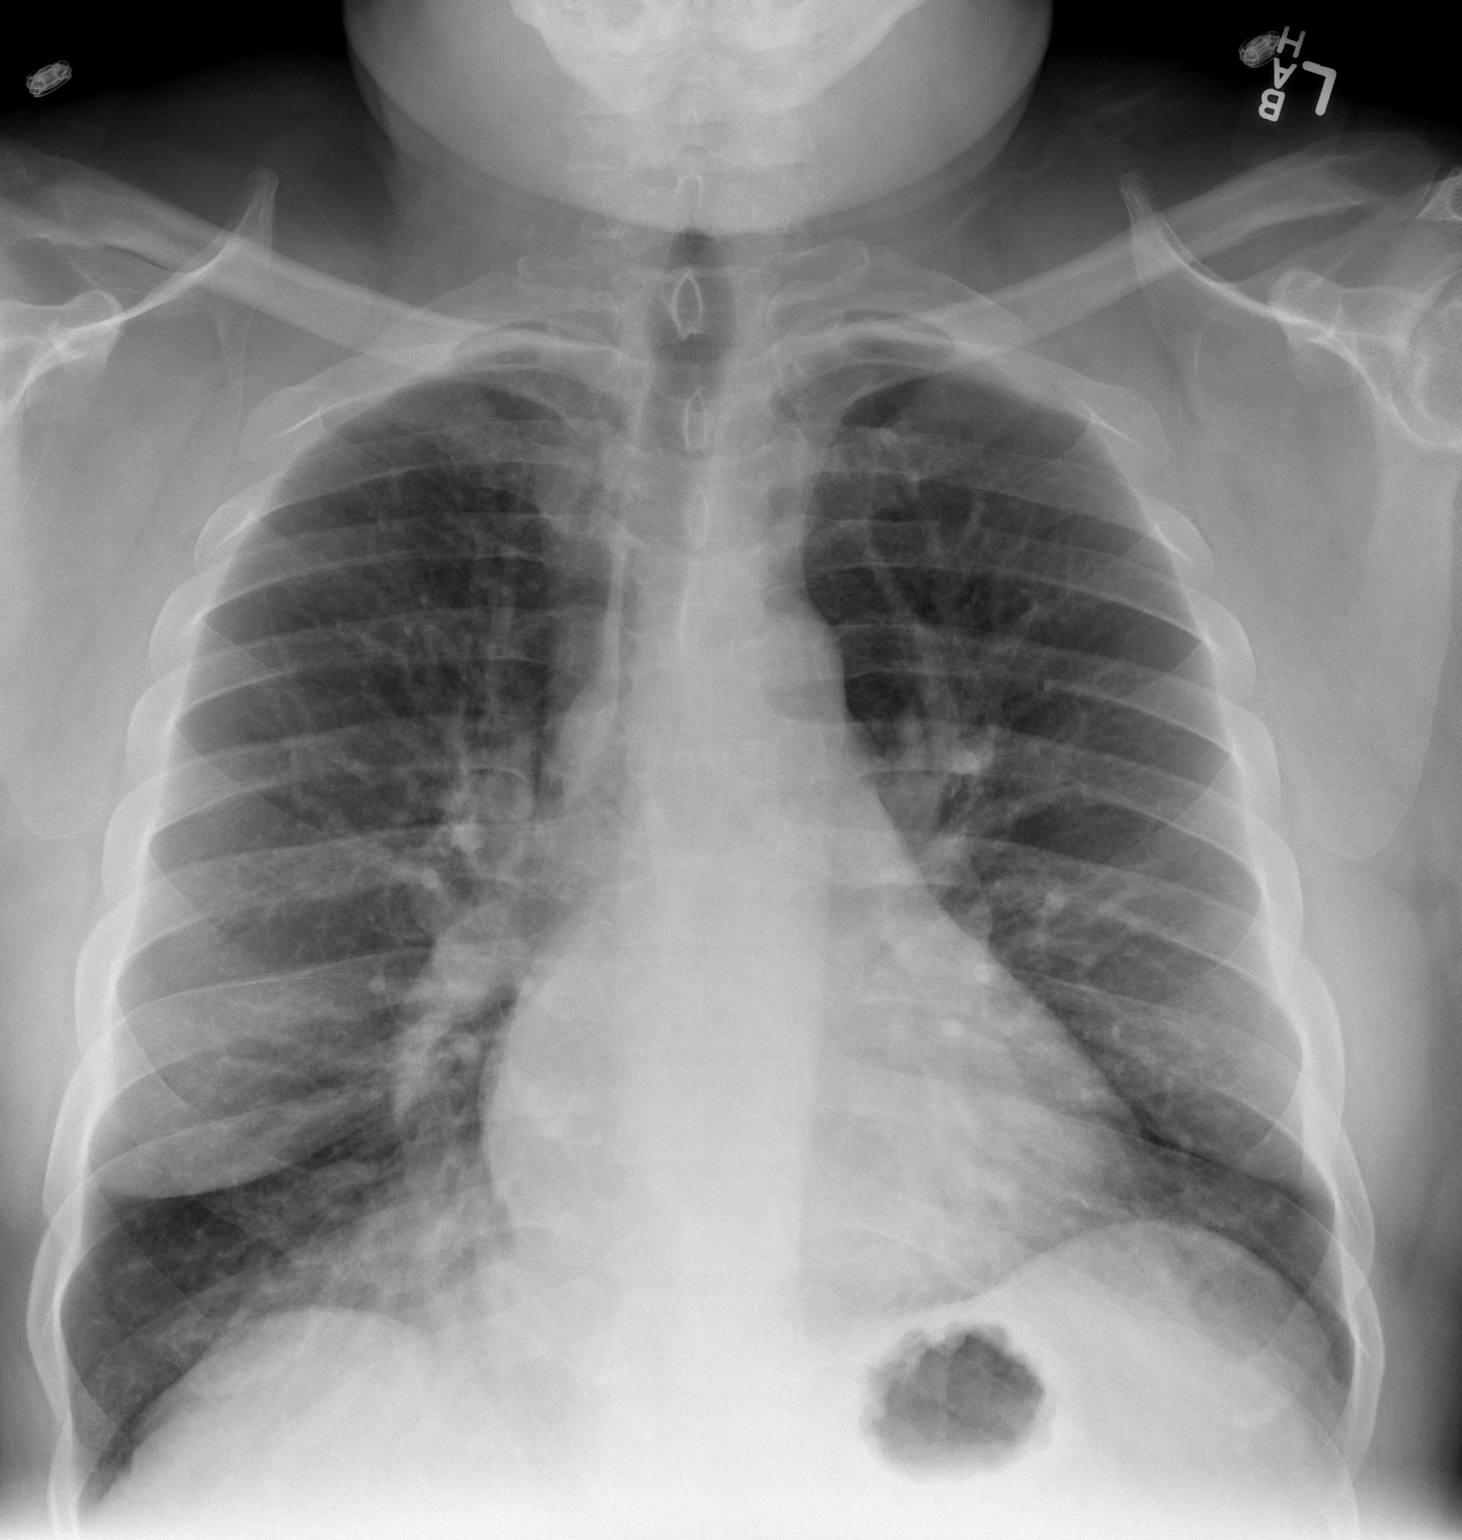

[w chest lat]
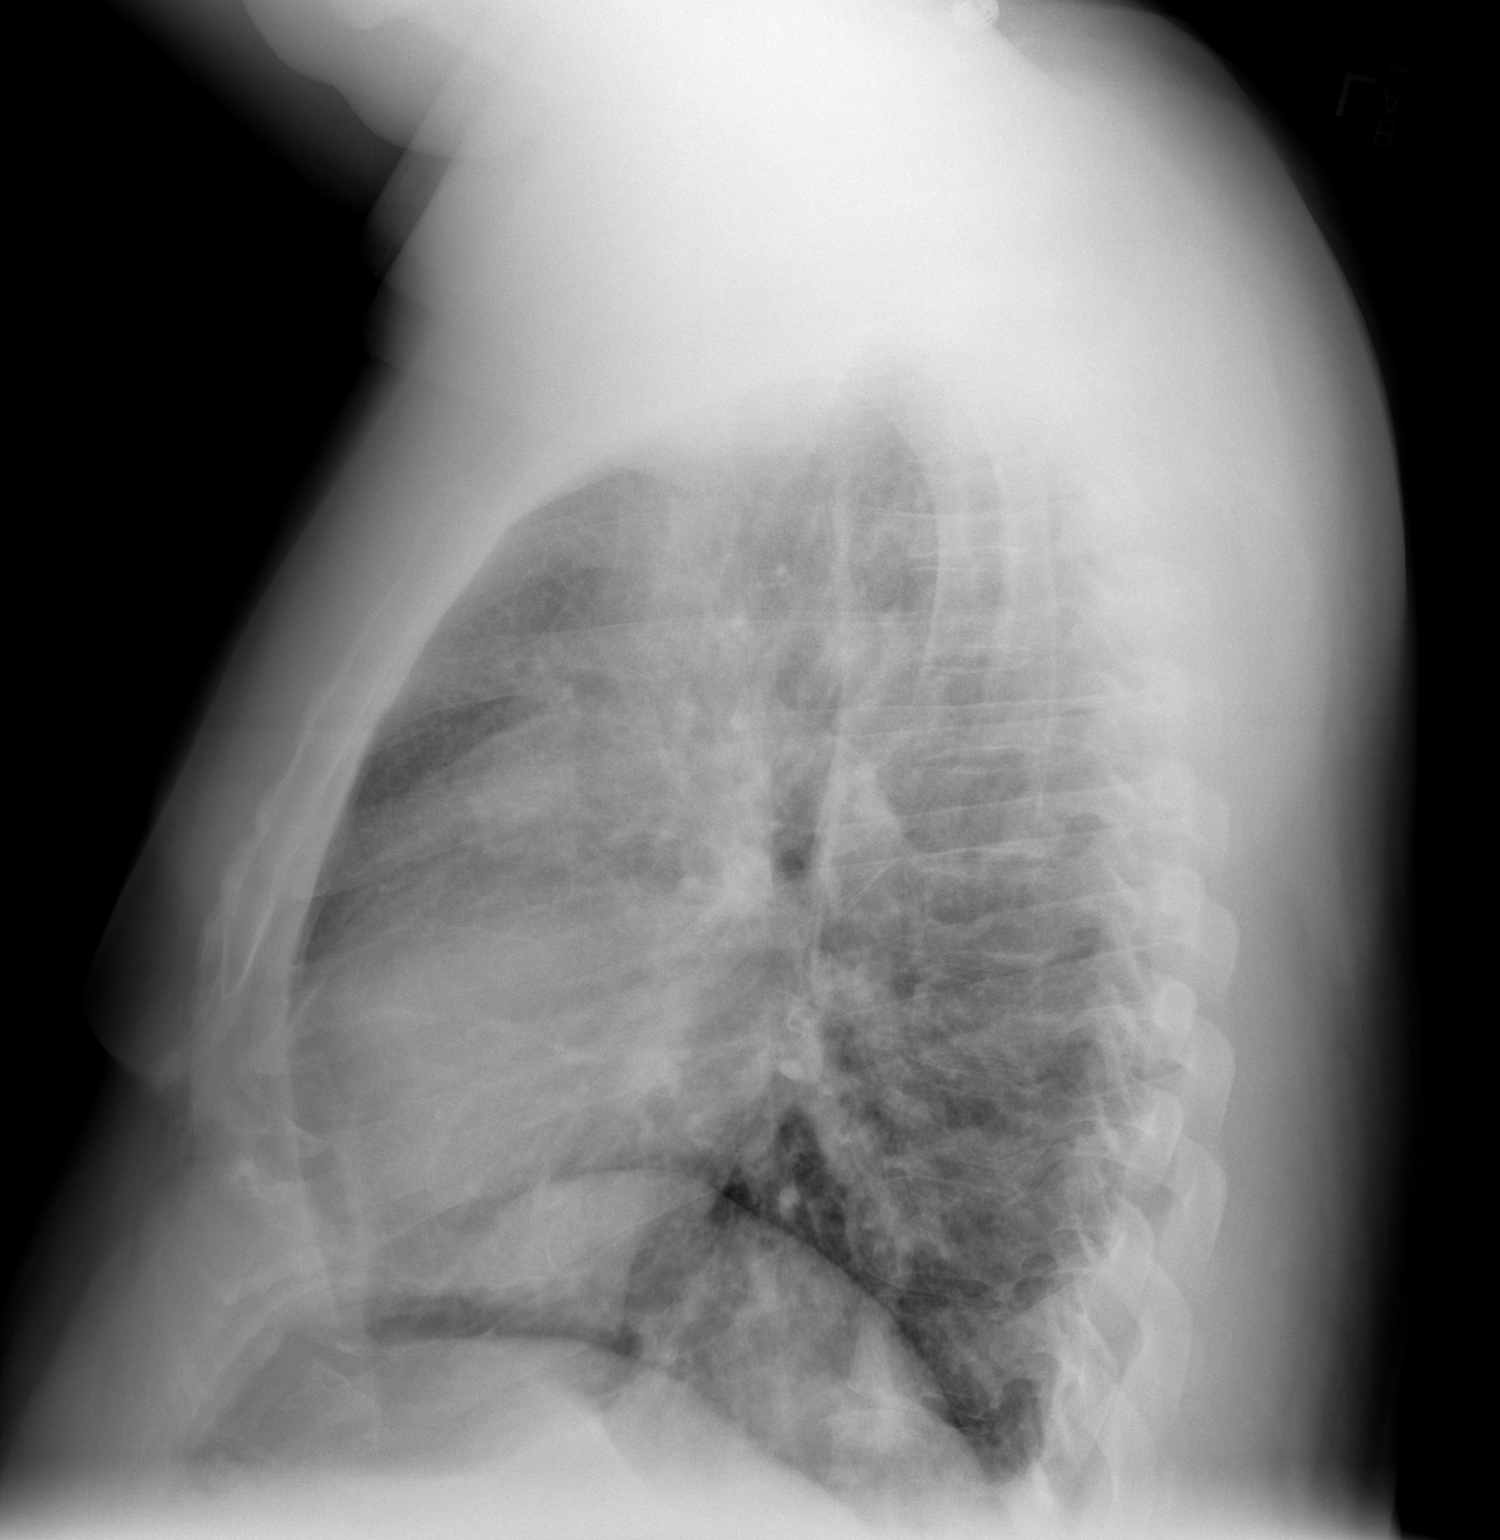

[2 of 2 positions shown; findings below may reference images not displayed]

FINDINGS: Cardiomediastinal silhouette is stable.  Hyperinflation
again noted.  No acute infiltrate or pulmonary edema.  Stable
elevation of the left hemidiaphragm.
IMPRESSION: No active disease.  Hyperinflation again noted.

## 2013-10-11 ENCOUNTER — Telehealth: Payer: Self-pay | Admitting: *Deleted

## 2013-10-11 ENCOUNTER — Other Ambulatory Visit: Payer: Medicaid Other

## 2013-10-11 ENCOUNTER — Other Ambulatory Visit: Payer: Self-pay | Admitting: *Deleted

## 2013-10-11 DIAGNOSIS — B370 Candidal stomatitis: Secondary | ICD-10-CM

## 2013-10-11 MED ORDER — FLUCONAZOLE 200 MG PO TABS: 200.0000 mg | ORAL_TABLET | Freq: Every day | ORAL | Status: DC

## 2013-10-11 NOTE — Telephone Encounter (Signed)
Patient came in for labs and was unable to be seen due to a lapse in his Medicaid. He is c/o oral thrush and patient does have white plaques on his tongue. Per Dr. Drue SecondSnider [facility] for fluconazole 200 mg daily x 10 days. Patient spoke with Morrison Oldisha regarding starting his Juanell FairlyRyan White and ADAP applications and will also contact Medicaid office to see if he still qualifies. Wendall MolaJacqueline Lillian Tigges

## 2013-10-25 ENCOUNTER — Encounter: Payer: Self-pay | Admitting: Internal Medicine

## 2013-10-25 ENCOUNTER — Ambulatory Visit (INDEPENDENT_AMBULATORY_CARE_PROVIDER_SITE_OTHER): Payer: Medicaid Other | Admitting: Internal Medicine

## 2013-10-25 VITALS — BP 144/85 | HR 105 | Temp 97.6°F | Wt 276.0 lb

## 2013-10-25 DIAGNOSIS — B20 Human immunodeficiency virus [HIV] disease: Secondary | ICD-10-CM

## 2013-10-25 LAB — COMPLETE METABOLIC PANEL WITH GFR
ALK PHOS: 45 U/L (ref 39–117)
ALT: 26 U/L (ref 0–53)
AST: 19 U/L (ref 0–37)
Albumin: 4.2 g/dL (ref 3.5–5.2)
BUN: 13 mg/dL (ref 6–23)
CO2: 30 meq/L (ref 19–32)
CREATININE: 0.85 mg/dL (ref 0.50–1.35)
Calcium: 9 mg/dL (ref 8.4–10.5)
Chloride: 99 mEq/L (ref 96–112)
GFR, Est Non African American: >89 mL/min
GLUCOSE: 126 mg/dL — AB (ref 70–99)
Potassium: 4.3 mEq/L (ref 3.5–5.3)
Sodium: 135 mEq/L (ref 135–145)
Total Bilirubin: 0.5 mg/dL (ref 0.2–1.2)
Total Protein: 8.9 g/dL — ABNORMAL HIGH (ref 6.0–8.3)

## 2013-10-25 LAB — CBC WITH DIFFERENTIAL/PLATELET
BASOS PCT: 1 % (ref 0–1)
Basophils Absolute: 0.1 10*3/uL (ref 0.0–0.1)
EOS PCT: 1 % (ref 0–5)
Eosinophils Absolute: 0.1 10*3/uL (ref 0.0–0.7)
HCT: 48.1 % (ref 39.0–52.0)
HEMOGLOBIN: 15.7 g/dL (ref 13.0–17.0)
LYMPHS ABS: 2.1 10*3/uL (ref 0.7–4.0)
Lymphocytes Relative: 33 % (ref 12–46)
MCH: 29.2 pg (ref 26.0–34.0)
MCHC: 32.6 g/dL (ref 30.0–36.0)
MCV: 89.4 fL (ref 78.0–100.0)
MONOS PCT: 13 % — AB (ref 3–12)
Monocytes Absolute: 0.8 10*3/uL (ref 0.1–1.0)
NEUTROS PCT: 52 % (ref 43–77)
Neutro Abs: 3.3 10*3/uL (ref 1.7–7.7)
PLATELETS: 198 10*3/uL (ref 150–400)
RBC: 5.38 MIL/uL (ref 4.22–5.81)
RDW: 13.4 % (ref 11.5–15.5)
WBC: 6.3 10*3/uL (ref 4.0–10.5)

## 2013-10-25 MED ORDER — DOLUTEGRAVIR SODIUM 50 MG PO TABS: 50.0000 mg | ORAL_TABLET | Freq: Every day | ORAL | Status: DC

## 2013-10-25 NOTE — Progress Notes (Signed)
Subjective:    Patient ID: Chris [name], male    DOB: 11/05/1965, 48 y.o.   MRN: [id number]  HPI Chris [name] a 48yo M with HIV, DM, OSA, DM, COPD with o2 dependency. CD 4 count of 120/VL<20 (oct 2014). While on RLG/DRVr BID/truvada plus dapsone. He reports missing 2 doses since last seeing him. He is does not have any new complaints other than low back pain "feels tight" worse towards end of the day. Has not taken medications for back pain. Denies any drainage from breast abscess  Current Outpatient Prescriptions on File Prior to Visit  Medication Sig Dispense Refill  . albuterol (PROVENTIL HFA;VENTOLIN HFA) 108 (90 BASE) MCG/ACT inhaler Inhale 2 puffs into the lungs every 6 (six) hours as needed for wheezing.  2 Inhaler  4  . albuterol (PROVENTIL) (5 MG/ML) 0.5% nebulizer solution Take 0.5 mLs (2.5 mg total) by nebulization every 6 (six) hours as needed for wheezing.  20 mL  11  . budesonide-formoterol (SYMBICORT) 160-4.5 MCG/ACT inhaler Inhale 2 puffs into the lungs 2 (two) times daily.  2 Inhaler  4  . dapsone 100 MG tablet Take 1 tablet (100 mg total) by mouth daily.  30 tablet  3  . emtricitabine-tenofovir (TRUVADA) 200-300 MG per tablet Take 1 tablet by mouth daily.  30 tablet  3  . insulin aspart (NOVOLOG) 100 UNIT/ML injection Inject 5 Units into the skin 3 (three) times daily with meals.      . meloxicam (MOBIC) 15 MG tablet Take 1 tablet (15 mg total) by mouth daily.  30 tablet  2  . tiotropium (SPIRIVA) 18 MCG inhalation capsule Place 1 capsule (18 mcg total) into inhaler and inhale daily.  30 capsule  11  . traMADol (ULTRAM) 50 MG tablet Take 2 tablets (100 mg total) by mouth every 6 (six) hours as needed.  90 tablet  0   No current facility-administered medications on file prior to visit.   Active Ambulatory Problems    Diagnosis Date Noted  . HIV disease 12/06/2012  . COPD/ still smoking  01/11/2013  . Nicotine abuse 01/11/2013  . Steroid-induced hyperglycemia 01/12/2013  .  Chronic respiratory failure 01/12/2013  . DM (diabetes mellitus) 01/27/2013  . Smoker 02/17/2013  . Hyperglycemia 02/17/2013  . Uncontrolled diabetes mellitus 02/17/2013  . COPD exacerbation 08/23/2013  . Cyst (solitary) of breast 08/23/2013  . Breast abscess 08/23/2013  . OSA (obstructive sleep apnea) 08/23/2013   Resolved Ambulatory Problems    Diagnosis Date Noted  . No Resolved Ambulatory Problems   Past Medical History  Diagnosis Date  . COPD (chronic obstructive pulmonary disease)   . Emphysema   . Diabetes mellitus without complication        Review of Systems 10 point ros is negative except for back pain    Objective:   Physical Exam BP 144/85  Pulse 105  Temp(Src) 97.6 F (36.4 C) (Oral)  Wt 276 lb (125.193 kg) Physical Exam  Constitutional: He is oriented to person, place, and time. He appears well-developed and well-nourished. No distress. Wearing oxygen HENT:  Mouth/Throat: Oropharynx is clear and moist. No oropharyngeal exudate.  Cardiovascular: Normal rate, regular rhythm and normal heart sounds. Exam reveals no gallop and no friction rub.  No murmur heard.  Pulmonary/Chest: Effort normal and breath sounds normal. No respiratory distress. He has no wheezes.  Abdominal: Soft. Bowel sounds are normal. He exhibits no distension. There is no tenderness.  Lymphadenopathy:  He has no cervical adenopathy.  Neurological:  He is alert and oriented to person, place, and time.  Skin: Skin is warm and dry. No rash noted. No erythema.  Psychiatric: He has a normal mood and affect. His behavior is normal.          Assessment & Plan:  hiv = will check labs today. Will change his hiv regimen  In order to reduce pill burden to:  - truvada daily - prezcobix daily - tivicay daily  oi proph = continue with dapsone Back pain = muscular strain. Recommend otc cream or heat pads

## 2013-10-26 LAB — HIV-1 RNA QUANT-NO REFLEX-BLD

## 2013-10-26 LAB — T-HELPER CELL (CD4) - (RCID CLINIC ONLY)
CD4 T CELL ABS: 120 /uL — AB (ref 400–2700)
CD4 T CELL HELPER: 6 % — AB (ref 33–55)

## 2013-11-01 ENCOUNTER — Telehealth: Payer: Self-pay | Admitting: *Deleted

## 2013-11-01 NOTE — Telephone Encounter (Signed)
Verbal order per Dr. Feliz BeamSnider's office [facility] prezcobix 1 daily, #30 with 11 refills. Wendall MolaJacqueline Cockerham

## 2013-11-21 ENCOUNTER — Ambulatory Visit: Payer: Medicaid Other | Admitting: Internal Medicine

## 2013-11-24 ENCOUNTER — Other Ambulatory Visit: Payer: Self-pay

## 2013-11-24 ENCOUNTER — Ambulatory Visit: Payer: Medicaid Other | Attending: Internal Medicine

## 2013-11-24 VITALS — BP 106/73 | HR 104 | Temp 97.9°F | Resp 20

## 2013-11-24 DIAGNOSIS — N611 Abscess of the breast and nipple: Secondary | ICD-10-CM

## 2013-11-24 DIAGNOSIS — J441 Chronic obstructive pulmonary disease with (acute) exacerbation: Secondary | ICD-10-CM

## 2013-11-24 MED ORDER — BUDESONIDE-FORMOTEROL FUMARATE 160-4.5 MCG/ACT IN AERO: 2.0000 | INHALATION_SPRAY | Freq: Two times a day (BID) | RESPIRATORY_TRACT | Status: DC

## 2013-11-24 MED ORDER — TRAMADOL HCL 50 MG PO TABS: 50.0000 mg | ORAL_TABLET | Freq: Two times a day (BID) | ORAL | Status: DC | PRN

## 2013-11-24 MED ORDER — TIOTROPIUM BROMIDE MONOHYDRATE 18 MCG IN CAPS: 18.0000 ug | ORAL_CAPSULE | Freq: Every day | RESPIRATORY_TRACT | Status: DC

## 2013-11-24 MED ORDER — ALBUTEROL SULFATE HFA 108 (90 BASE) MCG/ACT IN AERS: 2.0000 | INHALATION_SPRAY | Freq: Four times a day (QID) | RESPIRATORY_TRACT | Status: DC | PRN

## 2013-11-24 NOTE — Progress Notes (Unsigned)
Patient walked-in requesting refills of Albuterol inhaler, Symbicort, Spiriva, and Tramadol VSS. On oxygen 2L per nasal cannula. Refills of Albuterol, Symbicort, and Spiriva [facility] Spoke with Dr. Hyman HopesJegede who gave verbal order for Tramadol. Script given to patient. Patient instructed to keep his appointment. Instructed he will not receive additional refills until seen by provider.

## 2013-11-24 NOTE — Patient Instructions (Signed)
[facility]. You will be unable to obtain additional refills until you see doctor again.

## 2013-11-30 ENCOUNTER — Other Ambulatory Visit: Payer: Self-pay | Admitting: Licensed Clinical Social Worker

## 2013-12-12 ENCOUNTER — Emergency Department (HOSPITAL_COMMUNITY): Payer: Medicaid Other

## 2013-12-12 ENCOUNTER — Inpatient Hospital Stay (HOSPITAL_COMMUNITY)
Admission: EM | Admit: 2013-12-12 | Discharge: 2013-12-14 | DRG: 190 | Disposition: A | Discharge status: Home / Self Care | Payer: Medicaid Other | Attending: Internal Medicine | Admitting: Internal Medicine

## 2013-12-12 ENCOUNTER — Encounter (HOSPITAL_COMMUNITY): Payer: Self-pay | Admitting: Emergency Medicine

## 2013-12-12 ENCOUNTER — Observation Stay (HOSPITAL_COMMUNITY): Payer: Medicaid Other

## 2013-12-12 DIAGNOSIS — Z9981 Dependence on supplemental oxygen: Secondary | ICD-10-CM

## 2013-12-12 DIAGNOSIS — G8929 Other chronic pain: Secondary | ICD-10-CM | POA: Diagnosis present

## 2013-12-12 DIAGNOSIS — N6009 Solitary cyst of unspecified breast: Secondary | ICD-10-CM

## 2013-12-12 DIAGNOSIS — E119 Type 2 diabetes mellitus without complications: Secondary | ICD-10-CM | POA: Diagnosis present

## 2013-12-12 DIAGNOSIS — R0600 Dyspnea, unspecified: Secondary | ICD-10-CM

## 2013-12-12 DIAGNOSIS — J9620 Acute and chronic respiratory failure, unspecified whether with hypoxia or hypercapnia: Secondary | ICD-10-CM | POA: Diagnosis present

## 2013-12-12 DIAGNOSIS — IMO0002 Reserved for concepts with insufficient information to code with codable children: Secondary | ICD-10-CM

## 2013-12-12 DIAGNOSIS — J9611 Chronic respiratory failure with hypoxia: Secondary | ICD-10-CM | POA: Diagnosis present

## 2013-12-12 DIAGNOSIS — M545 Low back pain, unspecified: Secondary | ICD-10-CM | POA: Diagnosis present

## 2013-12-12 DIAGNOSIS — G4733 Obstructive sleep apnea (adult) (pediatric): Secondary | ICD-10-CM

## 2013-12-12 DIAGNOSIS — F17200 Nicotine dependence, unspecified, uncomplicated: Secondary | ICD-10-CM

## 2013-12-12 DIAGNOSIS — I10 Essential (primary) hypertension: Secondary | ICD-10-CM | POA: Diagnosis present

## 2013-12-12 DIAGNOSIS — J4489 Other specified chronic obstructive pulmonary disease: Secondary | ICD-10-CM

## 2013-12-12 DIAGNOSIS — J9610 Chronic respiratory failure, unspecified whether with hypoxia or hypercapnia: Secondary | ICD-10-CM

## 2013-12-12 DIAGNOSIS — R0609 Other forms of dyspnea: Secondary | ICD-10-CM | POA: Diagnosis present

## 2013-12-12 DIAGNOSIS — Z720 Tobacco use: Secondary | ICD-10-CM

## 2013-12-12 DIAGNOSIS — T380X5A Adverse effect of glucocorticoids and synthetic analogues, initial encounter: Secondary | ICD-10-CM

## 2013-12-12 DIAGNOSIS — J9612 Chronic respiratory failure with hypercapnia: Secondary | ICD-10-CM

## 2013-12-12 DIAGNOSIS — N611 Abscess of the breast and nipple: Secondary | ICD-10-CM

## 2013-12-12 DIAGNOSIS — R7309 Other abnormal glucose: Secondary | ICD-10-CM

## 2013-12-12 DIAGNOSIS — R0989 Other specified symptoms and signs involving the circulatory and respiratory systems: Secondary | ICD-10-CM

## 2013-12-12 DIAGNOSIS — E1165 Type 2 diabetes mellitus with hyperglycemia: Secondary | ICD-10-CM

## 2013-12-12 DIAGNOSIS — Z794 Long term (current) use of insulin: Secondary | ICD-10-CM

## 2013-12-12 DIAGNOSIS — B20 Human immunodeficiency virus [HIV] disease: Secondary | ICD-10-CM | POA: Diagnosis present

## 2013-12-12 DIAGNOSIS — IMO0001 Reserved for inherently not codable concepts without codable children: Secondary | ICD-10-CM | POA: Diagnosis present

## 2013-12-12 DIAGNOSIS — Z882 Allergy status to sulfonamides status: Secondary | ICD-10-CM

## 2013-12-12 DIAGNOSIS — N61 Mastitis without abscess: Secondary | ICD-10-CM

## 2013-12-12 DIAGNOSIS — Z79899 Other long term (current) drug therapy: Secondary | ICD-10-CM

## 2013-12-12 DIAGNOSIS — J441 Chronic obstructive pulmonary disease with (acute) exacerbation: Principal | ICD-10-CM

## 2013-12-12 DIAGNOSIS — R739 Hyperglycemia, unspecified: Secondary | ICD-10-CM

## 2013-12-12 DIAGNOSIS — J449 Chronic obstructive pulmonary disease, unspecified: Secondary | ICD-10-CM

## 2013-12-12 LAB — CBC
HCT: 48.8 % (ref 39.0–52.0)
Hemoglobin: 15.3 g/dL (ref 13.0–17.0)
MCH: 29.7 pg (ref 26.0–34.0)
MCHC: 31.4 g/dL (ref 30.0–36.0)
MCV: 94.8 fL (ref 78.0–100.0)
Platelets: 204 10*3/uL (ref 150–400)
RBC: 5.15 MIL/uL (ref 4.22–5.81)
RDW: 12.8 % (ref 11.5–15.5)
WBC: 5.7 10*3/uL (ref 4.0–10.5)

## 2013-12-12 LAB — I-STAT ARTERIAL BLOOD GAS, ED
ACID-BASE EXCESS: 4 mmol/L — AB (ref 0.0–2.0)
BICARBONATE: 31.3 meq/L — AB (ref 20.0–24.0)
O2 Saturation: 93 %
TCO2: 33 mmol/L (ref 0–100)
pCO2 arterial: 53 mmHg — ABNORMAL HIGH (ref 35.0–45.0)
pH, Arterial: 7.379 (ref 7.350–7.450)
pO2, Arterial: 68 mmHg — ABNORMAL LOW (ref 80.0–100.0)

## 2013-12-12 LAB — BASIC METABOLIC PANEL
BUN: 10 mg/dL (ref 6–23)
CALCIUM: 8.9 mg/dL (ref 8.4–10.5)
CO2: 30 mEq/L (ref 19–32)
CREATININE: 0.86 mg/dL (ref 0.50–1.35)
Chloride: 98 mEq/L (ref 96–112)
GFR calc Af Amer: >90 mL/min (ref >90)
GFR calc non Af Amer: >90 mL/min (ref >90)
Glucose, Bld: 266 mg/dL — ABNORMAL HIGH (ref 70–99)
Potassium: 4.2 mEq/L (ref 3.7–5.3)
Sodium: 137 mEq/L (ref 137–147)

## 2013-12-12 LAB — I-STAT CG4 LACTIC ACID, ED: Lactic Acid, Venous: 0.89 mmol/L (ref 0.5–2.2)

## 2013-12-12 LAB — I-STAT TROPONIN, ED: TROPONIN I, POC: 0 ng/mL (ref 0.00–0.08)

## 2013-12-12 LAB — PRO B NATRIURETIC PEPTIDE: PRO B NATRI PEPTIDE: 43.1 pg/mL (ref 0–125)

## 2013-12-12 MED ORDER — DAPSONE 100 MG PO TABS
100.0000 mg | ORAL_TABLET | Freq: Every day | ORAL | Status: DC
  Administered 2013-12-12 – 2013-12-14 (×3): 100 mg via ORAL
  Filled 2013-12-12 (×3): qty 1

## 2013-12-12 MED ORDER — SODIUM CHLORIDE 0.9 % IJ SOLN: 3.0000 mL | INTRAMUSCULAR | Status: DC | PRN

## 2013-12-12 MED ORDER — IOHEXOL 350 MG/ML SOLN
100.0000 mL | Freq: Once | INTRAVENOUS | Status: AC | PRN
  Administered 2013-12-12: 100 mL via INTRAVENOUS

## 2013-12-12 MED ORDER — INSULIN ASPART 100 UNIT/ML ~~LOC~~ SOLN
6.0000 [IU] | Freq: Three times a day (TID) | SUBCUTANEOUS | Status: DC
  Administered 2013-12-13 – 2013-12-14 (×3): 6 [IU] via SUBCUTANEOUS

## 2013-12-12 MED ORDER — INSULIN ASPART 100 UNIT/ML ~~LOC~~ SOLN
0.0000 [IU] | Freq: Three times a day (TID) | SUBCUTANEOUS | Status: DC
  Administered 2013-12-13: 11 [IU] via SUBCUTANEOUS
  Administered 2013-12-13: 20 [IU] via SUBCUTANEOUS
  Administered 2013-12-14: 11 [IU] via SUBCUTANEOUS

## 2013-12-12 MED ORDER — AZITHROMYCIN 500 MG PO TABS
500.0000 mg | ORAL_TABLET | Freq: Every day | ORAL | Status: DC
  Administered 2013-12-12: 500 mg via ORAL
  Filled 2013-12-12 (×2): qty 1

## 2013-12-12 MED ORDER — DEXTROSE 5 % IV SOLN
1.0000 g | INTRAVENOUS | Status: DC
  Filled 2013-12-12: qty 10

## 2013-12-12 MED ORDER — GUAIFENESIN-DM 100-10 MG/5ML PO SYRP
5.0000 mL | ORAL_SOLUTION | ORAL | Status: DC | PRN
  Filled 2013-12-12: qty 5

## 2013-12-12 MED ORDER — ONDANSETRON HCL 4 MG PO TABS: 4.0000 mg | ORAL_TABLET | Freq: Four times a day (QID) | ORAL | Status: DC | PRN

## 2013-12-12 MED ORDER — IPRATROPIUM-ALBUTEROL 0.5-2.5 (3) MG/3ML IN SOLN
6.0000 mL | Freq: Once | RESPIRATORY_TRACT | Status: AC
  Administered 2013-12-12: 6 mL via RESPIRATORY_TRACT
  Filled 2013-12-12: qty 3

## 2013-12-12 MED ORDER — GUAIFENESIN ER 600 MG PO TB12
1200.0000 mg | ORAL_TABLET | Freq: Two times a day (BID) | ORAL | Status: DC
Start: 2013-12-12 — End: 2013-12-14
  Administered 2013-12-12 – 2013-12-14 (×4): 1200 mg via ORAL
  Filled 2013-12-12 (×6): qty 2

## 2013-12-12 MED ORDER — SODIUM CHLORIDE 0.9 % IV SOLN: 250.0000 mL | INTRAVENOUS | Status: DC | PRN

## 2013-12-12 MED ORDER — ALBUTEROL SULFATE (2.5 MG/3ML) 0.083% IN NEBU: 2.5000 mg | INHALATION_SOLUTION | RESPIRATORY_TRACT | Status: DC

## 2013-12-12 MED ORDER — HYDROMORPHONE HCL PF 1 MG/ML IJ SOLN
1.0000 mg | Freq: Once | INTRAMUSCULAR | Status: AC
  Administered 2013-12-12: 1 mg via INTRAVENOUS
  Filled 2013-12-12: qty 1

## 2013-12-12 MED ORDER — DOLUTEGRAVIR SODIUM 50 MG PO TABS
50.0000 mg | ORAL_TABLET | Freq: Every day | ORAL | Status: DC
  Administered 2013-12-12 – 2013-12-14 (×3): 50 mg via ORAL
  Filled 2013-12-12 (×3): qty 1

## 2013-12-12 MED ORDER — LEVALBUTEROL HCL 0.63 MG/3ML IN NEBU
0.6300 mg | INHALATION_SOLUTION | Freq: Three times a day (TID) | RESPIRATORY_TRACT | Status: DC
  Administered 2013-12-12 – 2013-12-13 (×2): 0.63 mg via RESPIRATORY_TRACT
  Filled 2013-12-12 (×5): qty 3

## 2013-12-12 MED ORDER — ACETAMINOPHEN 325 MG PO TABS: 650.0000 mg | ORAL_TABLET | Freq: Four times a day (QID) | ORAL | Status: DC | PRN

## 2013-12-12 MED ORDER — METHYLPREDNISOLONE SODIUM SUCC 125 MG IJ SOLR
80.0000 mg | Freq: Three times a day (TID) | INTRAMUSCULAR | Status: DC
  Administered 2013-12-12 – 2013-12-14 (×5): 80 mg via INTRAVENOUS
  Filled 2013-12-12 (×6): qty 1.28
  Filled 2013-12-12: qty 2
  Filled 2013-12-12: qty 1.28
  Filled 2013-12-12: qty 2
  Filled 2013-12-12: qty 1.28

## 2013-12-12 MED ORDER — HYDROCODONE-ACETAMINOPHEN 5-325 MG PO TABS
1.0000 | ORAL_TABLET | ORAL | Status: DC | PRN
  Administered 2013-12-12: 1 via ORAL
  Filled 2013-12-12: qty 1

## 2013-12-12 MED ORDER — SODIUM CHLORIDE 0.9 % IJ SOLN
3.0000 mL | Freq: Two times a day (BID) | INTRAMUSCULAR | Status: DC
  Administered 2013-12-12 – 2013-12-14 (×4): 3 mL via INTRAVENOUS

## 2013-12-12 MED ORDER — SODIUM CHLORIDE 0.9 % IV BOLUS (SEPSIS)
1000.0000 mL | Freq: Once | INTRAVENOUS | Status: AC
  Administered 2013-12-12: 1000 mL via INTRAVENOUS

## 2013-12-12 MED ORDER — ONDANSETRON HCL 4 MG/2ML IJ SOLN: 4.0000 mg | Freq: Four times a day (QID) | INTRAMUSCULAR | Status: DC | PRN

## 2013-12-12 MED ORDER — ALBUTEROL SULFATE (2.5 MG/3ML) 0.083% IN NEBU
2.5000 mg | INHALATION_SOLUTION | RESPIRATORY_TRACT | Status: DC
Start: 2013-12-12 — End: 2013-12-12

## 2013-12-12 MED ORDER — DARUNAVIR-COBICISTAT 800-150 MG PO TABS
1.0000 | ORAL_TABLET | Freq: Every day | ORAL | Status: DC
  Filled 2013-12-12 (×3): qty 1

## 2013-12-12 MED ORDER — LEVALBUTEROL HCL 0.63 MG/3ML IN NEBU: 0.6300 mg | INHALATION_SOLUTION | Freq: Four times a day (QID) | RESPIRATORY_TRACT | Status: DC

## 2013-12-12 MED ORDER — ENOXAPARIN SODIUM 40 MG/0.4ML ~~LOC~~ SOLN
40.0000 mg | SUBCUTANEOUS | Status: DC
  Administered 2013-12-12: 40 mg via SUBCUTANEOUS
  Filled 2013-12-12 (×2): qty 0.4

## 2013-12-12 MED ORDER — METHYLPREDNISOLONE SODIUM SUCC 125 MG IJ SOLR
80.0000 mg | Freq: Once | INTRAMUSCULAR | Status: AC
  Administered 2013-12-12: 80 mg via INTRAVENOUS
  Filled 2013-12-12: qty 2

## 2013-12-12 MED ORDER — EMTRICITABINE-TENOFOVIR DF 200-300 MG PO TABS
1.0000 | ORAL_TABLET | Freq: Every day | ORAL | Status: DC
  Administered 2013-12-12 – 2013-12-14 (×3): 1 via ORAL
  Filled 2013-12-12 (×4): qty 1

## 2013-12-12 MED ORDER — ALBUTEROL (5 MG/ML) CONTINUOUS INHALATION SOLN
10.0000 mg/h | INHALATION_SOLUTION | Freq: Once | RESPIRATORY_TRACT | Status: AC
  Administered 2013-12-12: 10 mg/h via RESPIRATORY_TRACT
  Filled 2013-12-12: qty 20

## 2013-12-12 MED ORDER — DOCUSATE SODIUM 100 MG PO CAPS
100.0000 mg | ORAL_CAPSULE | Freq: Two times a day (BID) | ORAL | Status: DC
  Administered 2013-12-12 – 2013-12-14 (×4): 100 mg via ORAL
  Filled 2013-12-12 (×5): qty 1

## 2013-12-12 MED ORDER — DEXTROSE 5 % IV SOLN
1.0000 g | Freq: Once | INTRAVENOUS | Status: AC
  Administered 2013-12-12: 1 g via INTRAVENOUS
  Filled 2013-12-12: qty 10

## 2013-12-12 NOTE — ED Notes (Signed)
Report called to 6E 

## 2013-12-12 NOTE — ED Notes (Signed)
Lactic acid results given to Dr. Zavitz 
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
## 2013-12-12 NOTE — ED Notes (Signed)
Per pt sts lower back pain which is chronic for the past few days. sts tramadol isnt working. sts also having trouble with his breathing and productive cough. sts taking breathing treatment without relief. Pt on home O2.
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
## 2013-12-13 LAB — GLUCOSE, CAPILLARY
GLUCOSE-CAPILLARY: 282 mg/dL — AB (ref 70–99)
GLUCOSE-CAPILLARY: 382 mg/dL — AB (ref 70–99)
GLUCOSE-CAPILLARY: 421 mg/dL — AB (ref 70–99)
GLUCOSE-CAPILLARY: 430 mg/dL — AB (ref 70–99)

## 2013-12-13 LAB — BASIC METABOLIC PANEL
BUN: 15 mg/dL (ref 6–23)
CO2: 24 mEq/L (ref 19–32)
Calcium: 9.6 mg/dL (ref 8.4–10.5)
Chloride: 98 mEq/L (ref 96–112)
Creatinine, Ser: 0.75 mg/dL (ref 0.50–1.35)
GFR calc non Af Amer: >90 mL/min (ref >90)
GLUCOSE: 287 mg/dL — AB (ref 70–99)
POTASSIUM: 5.4 meq/L — AB (ref 3.7–5.3)
SODIUM: 137 meq/L (ref 137–147)

## 2013-12-13 LAB — GLUCOSE, RANDOM: Glucose, Bld: 450 mg/dL — ABNORMAL HIGH (ref 70–99)

## 2013-12-13 LAB — CBC
HCT: 47.1 % (ref 39.0–52.0)
HEMOGLOBIN: 15.2 g/dL (ref 13.0–17.0)
MCH: 30.3 pg (ref 26.0–34.0)
MCHC: 32.3 g/dL (ref 30.0–36.0)
MCV: 93.8 fL (ref 78.0–100.0)
Platelets: 202 10*3/uL (ref 150–400)
RBC: 5.02 MIL/uL (ref 4.22–5.81)
RDW: 12.7 % (ref 11.5–15.5)
WBC: 9.9 10*3/uL (ref 4.0–10.5)

## 2013-12-13 LAB — HEMOGLOBIN A1C
Hgb A1c MFr Bld: 8.5 % — ABNORMAL HIGH (ref <5.7)
Mean Plasma Glucose: 197 mg/dL — ABNORMAL HIGH (ref <117)

## 2013-12-13 MED ORDER — INSULIN GLARGINE 100 UNIT/ML ~~LOC~~ SOLN
10.0000 [IU] | Freq: Every day | SUBCUTANEOUS | Status: DC
  Administered 2013-12-13: 10 [IU] via SUBCUTANEOUS
  Filled 2013-12-13 (×3): qty 0.1

## 2013-12-13 MED ORDER — BUDESONIDE-FORMOTEROL FUMARATE 80-4.5 MCG/ACT IN AERO
2.0000 | INHALATION_SPRAY | Freq: Two times a day (BID) | RESPIRATORY_TRACT | Status: DC
  Administered 2013-12-13 – 2013-12-14 (×2): 2 via RESPIRATORY_TRACT

## 2013-12-13 MED ORDER — IPRATROPIUM-ALBUTEROL 0.5-2.5 (3) MG/3ML IN SOLN
3.0000 mL | RESPIRATORY_TRACT | Status: DC
  Administered 2013-12-13 (×3): 3 mL via RESPIRATORY_TRACT
  Filled 2013-12-13 (×3): qty 3

## 2013-12-13 MED ORDER — INSULIN ASPART 100 UNIT/ML ~~LOC~~ SOLN
10.0000 [IU] | Freq: Once | SUBCUTANEOUS | Status: AC
  Administered 2013-12-13: 10 [IU] via SUBCUTANEOUS

## 2013-12-13 MED ORDER — IPRATROPIUM-ALBUTEROL 0.5-2.5 (3) MG/3ML IN SOLN
3.0000 mL | Freq: Three times a day (TID) | RESPIRATORY_TRACT | Status: DC
  Administered 2013-12-14: 3 mL via RESPIRATORY_TRACT
  Filled 2013-12-13: qty 3

## 2013-12-13 MED ORDER — ENOXAPARIN SODIUM 60 MG/0.6ML ~~LOC~~ SOLN
60.0000 mg | SUBCUTANEOUS | Status: DC
  Administered 2013-12-13: 60 mg via SUBCUTANEOUS
  Filled 2013-12-13 (×2): qty 0.6

## 2013-12-13 MED ORDER — LEVALBUTEROL HCL 0.63 MG/3ML IN NEBU: 0.6300 mg | INHALATION_SOLUTION | Freq: Three times a day (TID) | RESPIRATORY_TRACT | Status: DC | PRN

## 2013-12-13 MED ORDER — DARUNAVIR-COBICISTAT 800-150 MG PO TABS
1.0000 | ORAL_TABLET | Freq: Every day | ORAL | Status: DC
  Administered 2013-12-13 – 2013-12-14 (×2): 1 via ORAL
  Filled 2013-12-13 (×3): qty 1

## 2013-12-13 MED ORDER — LEVOFLOXACIN 750 MG PO TABS
750.0000 mg | ORAL_TABLET | Freq: Every day | ORAL | Status: DC
  Administered 2013-12-13 – 2013-12-14 (×2): 750 mg via ORAL
  Filled 2013-12-13 (×2): qty 1

## 2013-12-13 MED ORDER — BUDESONIDE-FORMOTEROL FUMARATE 80-4.5 MCG/ACT IN AERO
2.0000 | INHALATION_SPRAY | Freq: Two times a day (BID) | RESPIRATORY_TRACT | Status: DC
  Filled 2013-12-13: qty 6.9

## 2013-12-13 NOTE — Progress Notes (Signed)
Pt CBG 421, pt noted to be eating candy and drinking root beer, just ate chicken salad. Sugars covered, MD notified. Will continue to monitor.

## 2013-12-13 NOTE — Progress Notes (Addendum)
[facility] PROGRESS NOTE  Albesa SeenJerome Ion AVW:[id number] DOB: 05/13/1966 DOA: 12/12/2013 PCP: Jeanann LewandowskyJEGEDE, OLUGBEMIGA, MD  Assessment/Plan: 48 y.o. male, with a history for COPD(on 2L at home) , HIV, AIDS, chronic resp failure presents to the emergency room with complaints of shortness of breath, cough, and sputum production for the past 3 days. He was feeling feverish and having night sweats the past two days -active smoker   Principal Problem:  Acute on chronic respiratory failure  Active Problems:  HIV disease  Chronic respiratory failure  DM (diabetes mellitus)  COPD exacerbation  Dyspnea    1. COPD exacerbation; CXR: no clear infiltrates;  -improving, cont bronchodilators, steroids, atx, oxygen; stop smoking  -CT angiogram negative for acute PE; 4mm right middle lobe pleural-based nodule need OP repeat CT  2. Acute on chronic respiratory failure; likely due to COPD+smoking  -ABG on admit: [id number] -patient is on home oxygen; improving ; cont as above   3. HIV/AIDS; continue home medications, consider ID consult if patient continued to be hypoxic.  -cont ; HAART medications. Patient is on Dapsone for PCP Prophylaxis, Due to Bactrim Allergy, doubt this is PC pneumonia  4. DM uncontrolled, patient does not take any meds at home;  -cont insulin treatment;  check HA1c;    Code Status: full Family Communication: d/w patient  (indicate person spoken with, relationship, and if by phone, the number) Disposition Plan: home 24-48 hours    Consultants:  none  Procedures:  none  Antibiotics:  Ceftriaxone-azithromycin 5/4<<<5/5  Levofloxacin 5/5>>>>  stop date if known)  HPI/Subjective: alert  Objective: Filed Vitals:   12/13/13 0435  BP: 136/87  Pulse: 94  Temp: 98.6 F (37 C)  Resp: 22    Intake/Output Summary (Last 24 hours) at 12/13/13 0859 Last data filed at 12/12/13 2019  Gross per 24 hour  Intake      0 ml  Output    750 ml  Net   -750 ml    Filed Weights   12/12/13 2018  Weight: 122.199 kg (269 lb 6.4 oz)    Exam:   General:  alert  Cardiovascular: s1,s2 rrr  Respiratory: few wheezing in LL  Abdomen: soft,nt,nd   Musculoskeletal: no LE edema   Data Reviewed: Basic Metabolic Panel:  Recent Labs Lab 12/12/13 1006 12/13/13 0613  NA 137 137  K 4.2 5.4*  CL 98 98  CO2 30 24  GLUCOSE 266* 287*  BUN 10 15  CREATININE 0.86 0.75  CALCIUM 8.9 9.6   Liver Function Tests: No results found for this basename: AST, ALT, ALKPHOS, BILITOT, PROT, ALBUMIN,  in the last 168 hours No results found for this basename: LIPASE, AMYLASE,  in the last 168 hours No results found for this basename: AMMONIA,  in the last 168 hours CBC:  Recent Labs Lab 12/12/13 1006 12/13/13 0613  WBC 5.7 9.9  HGB 15.3 15.2  HCT 48.8 47.1  MCV 94.8 93.8  PLT 204 202   Cardiac Enzymes: No results found for this basename: CKTOTAL, CKMB, CKMBINDEX, TROPONINI,  in the last 168 hours BNP (last 3 results)  Recent Labs  12/12/13 1006  PROBNP 43.1   CBG:  Recent Labs Lab 12/13/13 0738  GLUCAP 282*    No results found for this or any previous visit (from the past 240 hour(s)).   Studies: Dg Chest 2 View  12/12/2013   CLINICAL DATA:  Back pain and shortness of breath.  Cough.  EXAM: CHEST  2 VIEW  COMPARISON:  05/20/2013.  FINDINGS: Trachea is midline. Heart size stable. Probable scarring in the medial aspects of both lower lobes, unchanged. Lungs are otherwise clear. No pleural fluid. Left hemidiaphragm is mildly elevated.  IMPRESSION: No acute findings.   Electronically Signed   By: Leanna BattlesMelinda  Blietz M.D.   On: 12/12/2013 11:20   Ct Angio Chest Pe W/cm &/or Wo Cm  12/12/2013   CLINICAL DATA:  Short of breath, COPD, asthma  EXAM: CT ANGIOGRAPHY CHEST WITH CONTRAST  TECHNIQUE: Multidetector CT imaging of the chest was performed using the standard protocol during bolus administration of intravenous contrast. Multiplanar CT image  reconstructions and MIPs were obtained to evaluate the vascular anatomy.  CONTRAST:  100mL OMNIPAQUE IOHEXOL 350 MG/ML SOLN  COMPARISON:  None.  FINDINGS: Satisfactory opacification of pulmonary arteries noted, and there is no evidence of pulmonary emboli. Adequate contrast opacification of the thoracic aorta with no evidence of dissection, aneurysm, or stenosis. There is no pleural or pericardial effusion. No hilar or mediastinal adenopathy. Small subpleural blebs in both upper lobes. Scarring or subsegmental atelectasis anteromedially in the right middle lobe. 4 mm pleural-based right middle lobe peripheral nodule image 58/7. Lungs otherwise clear. Brachiocephalic arch anatomy without proximal stenosis. Thoracic spine and sternum intact. Visualized portions of upper abdomen unremarkable.  Review of the MIP images confirms the above findings.  IMPRESSION: 1. Negative for acute PE or thoracic aortic dissection. 2. 4 mm right middle lobe pleural-based nodule. Given risk factors for bronchogenic carcinoma, follow-up chest CT at 1 year is recommended. This recommendation follows the consensus statement: "Guidelines for Management of Small Pulmonary Nodules Detected on CT Scans: A Statement from the Fleischner Society" as published in Radiology 2005; 237:395-400. Available online at: [email or web address].[email or web address]://[email or web address].[employer].edu/rad/res/Fleischner-nodule.htm.   Electronically Signed   By: Oley Balmaniel  Hassell M.D.   On: 12/12/2013 15:31    Scheduled Meds: . azithromycin  500 mg Oral Daily  . cefTRIAXone (ROCEPHIN)  IV  1 g Intravenous Q24H  . dapsone  100 mg Oral Daily  . darunavir-cobicistat  1 tablet Oral Daily  . docusate sodium  100 mg Oral BID  . dolutegravir  50 mg Oral Daily  . emtricitabine-tenofovir  1 tablet Oral Daily  . enoxaparin (LOVENOX) injection  40 mg Subcutaneous Q24H  . guaiFENesin  1,200 mg Oral BID  . insulin aspart  0-20 Units Subcutaneous TID WC  . insulin aspart  6 Units Subcutaneous TID WC  . levalbuterol   0.63 mg Nebulization TID  . methylPREDNISolone (SOLU-MEDROL) injection  80 mg Intravenous 3 times per day  . sodium chloride  3 mL Intravenous Q12H   Continuous Infusions:   Principal Problem:   Acute on chronic respiratory failure Active Problems:   HIV disease   Chronic respiratory failure   DM (diabetes mellitus)   COPD exacerbation   Dyspnea    Time spent: >35 minutes    Esperanza SheetsUlugbek N Deion Forgue  [facility] Pager [phone]. If 7PM-7AM, please contact night-coverage at [email or web address], password [email or web address] 12/13/2013, 8:59 AM  LOS: 1 day

## 2013-12-14 DIAGNOSIS — IMO0001 Reserved for inherently not codable concepts without codable children: Secondary | ICD-10-CM

## 2013-12-14 DIAGNOSIS — F17200 Nicotine dependence, unspecified, uncomplicated: Secondary | ICD-10-CM

## 2013-12-14 DIAGNOSIS — E1165 Type 2 diabetes mellitus with hyperglycemia: Secondary | ICD-10-CM

## 2013-12-14 LAB — BASIC METABOLIC PANEL
BUN: 17 mg/dL (ref 6–23)
CALCIUM: 9.4 mg/dL (ref 8.4–10.5)
CHLORIDE: 97 meq/L (ref 96–112)
CO2: 29 mEq/L (ref 19–32)
CREATININE: 0.79 mg/dL (ref 0.50–1.35)
GFR calc Af Amer: >90 mL/min (ref >90)
Glucose, Bld: 324 mg/dL — ABNORMAL HIGH (ref 70–99)
Potassium: 4.9 mEq/L (ref 3.7–5.3)
Sodium: 138 mEq/L (ref 137–147)

## 2013-12-14 LAB — GLUCOSE, CAPILLARY: GLUCOSE-CAPILLARY: 292 mg/dL — AB (ref 70–99)

## 2013-12-14 MED ORDER — GUAIFENESIN ER 600 MG PO TB12: 1200.0000 mg | ORAL_TABLET | Freq: Two times a day (BID) | ORAL | Status: DC

## 2013-12-14 MED ORDER — LEVOFLOXACIN 750 MG PO TABS: 750.0000 mg | ORAL_TABLET | Freq: Every day | ORAL | Status: DC

## 2013-12-14 MED ORDER — INSULIN ASPART 100 UNIT/ML ~~LOC~~ SOLN: 0.0000 [IU] | Freq: Three times a day (TID) | SUBCUTANEOUS | Status: DC

## 2013-12-14 MED ORDER — METHYLPREDNISOLONE SODIUM SUCC 125 MG IJ SOLR
60.0000 mg | Freq: Two times a day (BID) | INTRAMUSCULAR | Status: DC
  Filled 2013-12-14: qty 0.96

## 2013-12-14 MED ORDER — LISINOPRIL 5 MG PO TABS: 5.0000 mg | ORAL_TABLET | Freq: Every day | ORAL | Status: DC

## 2013-12-14 MED ORDER — PREDNISONE (PAK) 10 MG PO TABS: ORAL_TABLET | Freq: Every day | ORAL | Status: DC

## 2013-12-14 MED ORDER — HYDRALAZINE HCL 20 MG/ML IJ SOLN
10.0000 mg | Freq: Once | INTRAMUSCULAR | Status: AC
  Administered 2013-12-14: 10 mg via INTRAVENOUS
  Filled 2013-12-14: qty 1

## 2013-12-14 MED ORDER — INSULIN GLARGINE 100 UNIT/ML ~~LOC~~ SOLN: 10.0000 [IU] | Freq: Every day | SUBCUTANEOUS | Status: DC

## 2013-12-14 MED ORDER — INSULIN ASPART 100 UNIT/ML ~~LOC~~ SOLN: 6.0000 [IU] | Freq: Three times a day (TID) | SUBCUTANEOUS | Status: DC

## 2013-12-14 NOTE — Discharge Instructions (Signed)
Chronic Obstructive Pulmonary Disease Chronic obstructive pulmonary disease (COPD) is a common lung condition in which airflow from the lungs is limited. COPD is a general term that can be used to describe many different lung problems that limit airflow, including both chronic bronchitis and emphysema. If you have COPD, your lung function will probably never return to normal, but there are measures you can take to improve lung function and make yourself feel better.  CAUSES   Smoking (common).   Exposure to secondhand smoke.   Genetic problems.  Chronic inflammatory lung diseases or recurrent infections. SYMPTOMS   Shortness of breath, especially with physical activity.   Deep, persistent (chronic) cough with a large amount of thick mucus.   Wheezing.   Rapid breaths (tachypnea).   Gray or bluish discoloration (cyanosis) of the skin, especially in fingers, toes, or lips.   Fatigue.   Weight loss.   Frequent infections or episodes when breathing symptoms become much worse (exacerbations).   Chest tightness. DIAGNOSIS  Your healthcare provider will take a medical history and perform a physical examination to make the initial diagnosis. Additional tests for COPD may include:   Lung (pulmonary) function tests.  Chest X-ray.  CT scan.  Blood tests. TREATMENT  Treatment available to help you feel better when you have COPD include:   Inhaler and nebulizer medicines. These help manage the symptoms of COPD and make your breathing more comfortable  Supplemental oxygen. Supplemental oxygen is only helpful if you have a low oxygen level in your blood.   Exercise and physical activity. These are beneficial for nearly all people with COPD. Some people may also benefit from a pulmonary rehabilitation program. HOME CARE INSTRUCTIONS   Take all medicines (inhaled or pills) as directed by your health care provider.  Only take over-the-counter or prescription medicines  for pain, fever, or discomfort as directed by your health care provider.   Avoid over-the-counter medicines or cough syrups that dry up your airway (such as antihistamines) and slow down the elimination of secretions unless instructed otherwise by your healthcare provider.   If you are a smoker, the most important thing that you can do is stop smoking. Continuing to smoke will cause further lung damage and breathing trouble. Ask your health care provider for help with quitting smoking. He or she can direct you to community resources or hospitals that provide support.  Avoid exposure to irritants such as smoke, chemicals, and fumes that aggravate your breathing.  Use oxygen therapy and pulmonary rehabilitation if directed by your health care provider. If you require home oxygen therapy, ask your healthcare provider whether you should purchase a pulse oximeter to measure your oxygen level at home.   Avoid contact with individuals who have a contagious illness.  Avoid extreme temperature and humidity changes.  Eat healthy foods. Eating smaller, more frequent meals and resting before meals may help you maintain your strength.  Stay active, but balance activity with periods of rest. Exercise and physical activity will help you maintain your ability to do things you want to do.  Preventing infection and hospitalization is very important when you have COPD. Make sure to receive all the vaccines your health care provider recommends, especially the pneumococcal and influenza vaccines. Ask your healthcare provider whether you need a pneumonia vaccine.  Learn and use relaxation techniques to manage stress.  Learn and use controlled breathing techniques as directed by your health care provider. Controlled breathing techniques include:   Pursed lip breathing. Start by breathing   in (inhaling) through your nose for 1 second. Then, purse your lips as if you were going to whistle and breathe out (exhale)  through the pursed lips for 2 seconds.   Diaphragmatic breathing. Start by putting one hand on your abdomen just above your waist. Inhale slowly through your nose. The hand on your abdomen should move out. Then purse your lips and exhale slowly. You should be able to feel the hand on your abdomen moving in as you exhale.   Learn and use controlled coughing to clear mucus from your lungs. Controlled coughing is a series of short, progressive coughs. The steps of controlled coughing are:  1. Lean your head slightly forward.  2. Breathe in deeply using diaphragmatic breathing.  3. Try to hold your breath for 3 seconds.  4. Keep your mouth slightly open while coughing twice.  5. Spit any mucus out into a tissue.  6. Rest and repeat the steps once or twice as needed. SEEK MEDICAL CARE IF:   You are coughing up more mucus than usual.   There is a change in the color or thickness of your mucus.   Your breathing is more labored than usual.   Your breathing is faster than usual.  SEEK IMMEDIATE MEDICAL CARE IF:   You have shortness of breath while you are resting.   You have shortness of breath that prevents you from:  Being able to talk.   Performing your usual physical activities.   You have chest pain lasting longer than 5 minutes.   Your skin color is more cyanotic than usual.  You measure low oxygen saturations for longer than 5 minutes with a pulse oximeter. MAKE SURE YOU:   Understand these instructions.  Will watch your condition.  Will get help right away if you are not doing well or get worse. Document Released: 05/07/2005 Document Revised: 05/18/2013 Document Reviewed: 03/24/2013 ExitCare Patient Information 2014 [employer].   Smoking Cessation Quitting smoking is important to your health and has many advantages. However, it is not always easy to quit since nicotine is a very addictive drug. Often times, people try 3 times or more before being  able to quit. This document explains the best ways for you to prepare to quit smoking. Quitting takes hard work and a lot of effort, but you can do it. ADVANTAGES OF QUITTING SMOKING  You will live longer, feel better, and live better.  Your body will feel the impact of quitting smoking almost immediately.  Within 20 minutes, blood pressure decreases. Your pulse returns to its normal level.  After 8 hours, carbon monoxide levels in the blood return to normal. Your oxygen level increases.  After 24 hours, the chance of having a heart attack starts to decrease. Your breath, hair, and body stop smelling like smoke.  After 48 hours, damaged nerve endings begin to recover. Your sense of taste and smell improve.  After 72 hours, the body is virtually free of nicotine. Your bronchial tubes relax and breathing becomes easier.  After 2 to 12 weeks, lungs can hold more air. Exercise becomes easier and circulation improves.  The risk of having a heart attack, stroke, cancer, or lung disease is greatly reduced.  After 1 year, the risk of coronary heart disease is cut in half.  After 5 years, the risk of stroke falls to the same as a nonsmoker.  After 10 years, the risk of lung cancer is cut in half and the risk of other cancers decreases   significantly.  After 15 years, the risk of coronary heart disease drops, usually to the level of a nonsmoker.  If you are pregnant, quitting smoking will improve your chances of having a healthy baby.  The people you live with, especially any children, will be healthier.  You will have extra money to spend on things other than cigarettes. QUESTIONS TO THINK ABOUT BEFORE ATTEMPTING TO QUIT You may want to talk about your answers with your caregiver.  Why do you want to quit?  If you tried to quit in the past, what helped and what did not?  What will be the most difficult situations for you after you quit? How will you plan to handle them?  Who can help  you through the tough times? Your family? Friends? A caregiver?  What pleasures do you get from smoking? What ways can you still get pleasure if you quit? Here are some questions to ask your caregiver:  How can you help me to be successful at quitting?  What medicine do you think would be best for me and how should I take it?  What should I do if I need more help?  What is smoking withdrawal like? How can I get information on withdrawal? GET READY  Set a quit date.  Change your environment by getting rid of all cigarettes, ashtrays, matches, and lighters in your home, car, or work. Do not let people smoke in your home.  Review your past attempts to quit. Think about what worked and what did not. GET SUPPORT AND ENCOURAGEMENT You have a better chance of being successful if you have help. You can get support in many ways.  Tell your family, friends, and co-workers that you are going to quit and need their support. Ask them not to smoke around you.  Get individual, group, or telephone counseling and support. Programs are available at local hospitals and health centers. Call your local health department for information about programs in your area.  Spiritual beliefs and practices may help some smokers quit.  Download a "quit meter" on your computer to keep track of quit statistics, such as how long you have gone without smoking, cigarettes not smoked, and money saved.  Get a self-help book about quitting smoking and staying off of tobacco. LEARN NEW SKILLS AND BEHAVIORS  Distract yourself from urges to smoke. Talk to someone, go for a walk, or occupy your time with a task.  Change your normal routine. Take a different route to work. Drink tea instead of coffee. Eat breakfast in a different place.  Reduce your stress. Take a hot bath, exercise, or read a book.  Plan something enjoyable to do every day. Reward yourself for not smoking.  Explore interactive web-based programs that  specialize in helping you quit. GET MEDICINE AND USE IT CORRECTLY Medicines can help you stop smoking and decrease the urge to smoke. Combining medicine with the above behavioral methods and support can greatly increase your chances of successfully quitting smoking.  Nicotine replacement therapy helps deliver nicotine to your body without the negative effects and risks of smoking. Nicotine replacement therapy includes nicotine gum, lozenges, inhalers, nasal sprays, and skin patches. Some may be available over-the-counter and others require a prescription.  Antidepressant medicine helps people abstain from smoking, but how this works is unknown. This medicine is available by prescription.  Nicotinic receptor partial agonist medicine simulates the effect of nicotine in your brain. This medicine is available by prescription. Ask your caregiver for advice   about which medicines to use and how to use them based on your health history. Your caregiver will tell you what side effects to look out for if you choose to be on a medicine or therapy. Carefully read the information on the package. Do not use any other product containing nicotine while using a nicotine replacement product.  RELAPSE OR DIFFICULT SITUATIONS Most relapses occur within the first 3 months after quitting. Do not be discouraged if you start smoking again. Remember, most people try several times before finally quitting. You may have symptoms of withdrawal because your body is used to nicotine. You may crave cigarettes, be irritable, feel very hungry, cough often, get headaches, or have difficulty concentrating. The withdrawal symptoms are only temporary. They are strongest when you first quit, but they will go away within 10 14 days. To reduce the chances of relapse, try to:  Avoid drinking alcohol. Drinking lowers your chances of successfully quitting.  Reduce the amount of caffeine you consume. Once you quit smoking, the amount of caffeine  in your body increases and can give you symptoms, such as a rapid heartbeat, sweating, and anxiety.  Avoid smokers because they can make you want to smoke.  Do not let weight gain distract you. Many smokers will gain weight when they quit, usually less than 10 pounds. Eat a healthy diet and stay active. You can always lose the weight gained after you quit.  Find ways to improve your mood other than smoking. FOR MORE INFORMATION  www.smokefree.gov  Document Released: 07/22/2001 Document Revised: 01/27/2012 Document Reviewed: 11/06/2011 Exit[employer] Patient Information 2014 [employer].  

## 2013-12-14 NOTE — Progress Notes (Signed)
IV d/c'd, tele d/c'd, Sonya CMT notified. Tele box #2 removed and returned to [place]. Will continue to monitor.

## 2013-12-14 NOTE — Progress Notes (Signed)
Pt signed d/c papers. Teaching provided on the importance of checking blood sugars, of taking insulin, steriods, antibiotics, and bp medications as prescribed, and on increasing physical activity as tolerated. Will continue to monitor. Prescriptions given.

## 2013-12-14 NOTE — Progress Notes (Signed)
Pt with elevated bp. Spoke with MD, 10mg  IV hydralazine given Blood pressure 168/91, pulse 95, temperature 97.6 F (36.4 C), temperature source Oral, resp. rate 20, height 5\' 9"  (1.753 m), weight 122.244 kg (269 lb 8 oz), SpO2 88.00%. recheck bp 152/70. Notified MD. Will continue to monitor.

## 2013-12-14 NOTE — Discharge Summary (Signed)
Physician Discharge Summary  Chris SeenJerome Husmann ZOX:[id number] DOB: 02/09/1966 DOA: 12/12/2013  PCP: Jeanann LewandowskyJEGEDE, OLUGBEMIGA, MD  Admit date: 12/12/2013 Discharge date: 12/14/2013  Time spent: 45 minutes  Recommendations for Outpatient Follow-up:  Patient will be discharged to home.  He is to follow up with his primary care physician within one week.  He should continue taking his medications as prescribed.  Patient was counseled regarding his tobacco abuse. Patient will need to discuss his diabetes management as well as hypertension with his primary care physician.   Discharge Diagnoses:  Principal Problem:   Acute on chronic respiratory failure Active Problems:   HIV disease   Chronic respiratory failure   DM (diabetes mellitus)   COPD exacerbation   Dyspnea   Discharge Condition: Stable  Diet recommendation: Heart healthy/carb modified  Filed Weights   12/12/13 2018 12/13/13 2044  Weight: 122.199 kg (269 lb 6.4 oz) 122.244 kg (269 lb 8 oz)    History of present illness:  Chris [name] a 48 y.o. male, with a history for COPD(on 2L at home) , HIV, AIDS, chronic resp failure presents to the emergency room with complaints of shortness of breath, cough, and sputum production for the past 3 days. He was feeling feverish and having night sweats the past two days. He stated he was previously treated for pneumonia but cannot remember the Doctors name or the antibiotic he was given. He also complaining of chronic lower back pain that has become worse over the past few weeks. He has inhalers for COPD which he tried and he reported mild improvement. He stated he is compliant with his HIV mediation and has not missed any doses.   In the emergency room he was found to be afebrile, tachycardic tachpnea, and requiring increased oxygen than he normally requires. ABG drawn with results being PC02 53 and PO2 68. C-XRAY showed no acute abnormalities. He wast started on rocephin and amoxicillin. No improvement was  noted on recheck exam. Patient was given continuous neb and Steroids.  Hospital Course:  Acute on chronic type toxic respiratory failure secondary to COPD exacerbation -Chest x-ray: No acute findings -CT chest: Negative for PE or dissection, 1 mm right middle lobe pleural-based nodule, will need followup CT in 1 year -Patient initially placed on antibiotics and transitioned to Levaquin -Continue bronchodilators, prednisone taper, home oxygen, Spiriva, symbicort  COPD exacerbation -Treatment plan above  HIV/AIDS -Continue home medication -Continue dapsone for PCP prophylaxis (patient has allergy to Bactrim)  Uncontrolled diabetes mellitus -Patient counseled on his diabetes medications and regimen -States he has insulin at home -Will discharge patient on Lantus, NovoLog as well as sliding scale -Hemoglobin A1c 8.5 -Patient is to discuss his regimen with his primary care physician -Patient currently on steroids causing hyperglycemia  Tobacco abuse -Counseled patient on nicotine cessation  Hypertension -Patient has a history of hypertension however remains hypertensive during his hospital course -Refuses to start medication at this time -Patient will be discharged with lisinopril prescription -Patient to discuss this with his primary care physician at discharge.  Procedures: None  Consultations: None  Discharge Exam: Filed Vitals:   12/14/13 0743  BP: 168/91  Pulse: 95  Temp: 97.6 F (36.4 C)  Resp: 20     General: Well developed, well nourished, NAD, appears stated age  HEENT: NCAT, PERRLA, EOMI, Anicteic Sclera, mucous membranes moist.   Neck: Supple, no JVD, no masses  Cardiovascular: S1 S2 auscultated, no rubs, murmurs or gallops. Regular rate and rhythm.  Respiratory: Clear to auscultation  bilaterally with equal chest rise, occasional nonproductive  Abdomen: Soft, nontender, nondistended, + bowel sounds  Extremities: warm dry without cyanosis clubbing or  edema  Neuro: AAOx3, cranial nerves grossly intact. Strength 5/5 in patient's upper and lower extremities bilaterally  Skin: Without rashes exudates or nodules  Psych: Normal affect and demeanor with intact judgement and insight  Discharge Instructions      Discharge Orders   Future Appointments Provider Department Dept Phone   01/17/2014 12:15 PM Jeanann Lewandowskylugbemiga Jegede, MD [facility] [phone]   01/24/2014 10:45 AM [facility] [phone]   02/08/2014 10:30 AM Judyann Munsonynthia Snider, MD [facility] [phone]   Future Orders Complete By Expires   Diet - low sodium heart healthy  As directed    Diet Carb Modified  As directed    Discharge instructions  As directed    Increase activity slowly  As directed        Medication List    STOP taking these medications       naproxen sodium 220 MG tablet  Commonly known as:  ANAPROX      TAKE these medications       albuterol (5 MG/ML) 0.5% nebulizer solution  Commonly known as:  PROVENTIL  Take 0.5 mLs (2.5 mg total) by nebulization every 6 (six) hours as needed for wheezing.     albuterol 108 (90 BASE) MCG/ACT inhaler  Commonly known as:  PROVENTIL HFA;VENTOLIN HFA  Inhale 2 puffs into the lungs every 6 (six) hours as needed for wheezing.     budesonide-formoterol 160-4.5 MCG/ACT inhaler  Commonly known as:  SYMBICORT  Inhale 2 puffs into the lungs 2 (two) times daily.     dapsone 100 MG tablet  Take 1 tablet (100 mg total) by mouth daily.     dolutegravir 50 MG tablet  Commonly known as:  TIVICAY  Take 1 tablet (50 mg total) by mouth daily.     emtricitabine-tenofovir 200-300 MG per tablet  Commonly known as:  TRUVADA  Take 1 tablet by mouth daily.     guaiFENesin 600 MG 12 hr tablet  Commonly known as:  MUCINEX  Take 2 tablets (1,200 mg total) by mouth 2 (two) times daily.     insulin aspart 100 UNIT/ML  injection  Commonly known as:  novoLOG  Inject 6 Units into the skin 3 (three) times daily with meals.     insulin aspart 100 UNIT/ML injection  Commonly known as:  novoLOG  - Inject 0-20 Units into the skin 3 (three) times daily with meals. CBG 70 - 120: 0 units  - CBG 121 - 150: 3 units  - CBG 151 - 200: 4 units  - CBG 201 - 250: 7 units  - CBG 251 - 300: 11 units  - CBG 301 - 350: 15 units  - CBG 351 - 400: 20 units     insulin glargine 100 UNIT/ML injection  Commonly known as:  LANTUS  Inject 0.1 mLs (10 Units total) into the skin at bedtime.     levofloxacin 750 MG tablet  Commonly known as:  LEVAQUIN  Take 1 tablet (750 mg total) by mouth daily.     lisinopril 5 MG tablet  Commonly known as:  PRINIVIL,ZESTRIL  Take 1 tablet (5 mg total) by mouth daily.     predniSONE 10 MG tablet  Commonly known as:  STERAPRED UNI-PAK  Take by mouth  daily. Prednisone dosing: Take  Prednisone 60mg  (6 tabs) x 3 days, then taper to 50mg  (5 tabs) x 3 days, then 40mg  (4 tabs) x 3days, then 30mg  (3 tabs) x 3 days, then 20mg  (2 tabs) x 3 days, then 10mg  (1 tab) x 3days, then OFF.     PREZCOBIX 800-150 MG per tablet  Generic drug:  darunavir-cobicistat  Take 1 tablet by mouth daily. Swallow whole. Do NOT crush, break or chew tablets. Take with food.     tiotropium 18 MCG inhalation capsule  Commonly known as:  SPIRIVA  Place 1 capsule (18 mcg total) into inhaler and inhale daily.     traMADol 50 MG tablet  Commonly known as:  ULTRAM  Take 50 mg by mouth every 12 (twelve) hours as needed for moderate pain.       Allergies  Allergen Reactions  . Bactrim [Sulfamethoxazole-Tmp Ds] Hives   Follow-up Information   Follow up with JEGEDE, OLUGBEMIGA, MD. Schedule an appointment as soon as possible for a visit in 1 week. [facility] followup)    Specialty:  Internal Medicine   Contact information:   [address] [place] [address] [phone]        The results of  significant diagnostics from this hospitalization (including imaging, microbiology, ancillary and laboratory) are listed below for reference.    Significant Diagnostic Studies: Dg Chest 2 View  12/12/2013   CLINICAL DATA:  Back pain and shortness of breath.  Cough.  EXAM: CHEST  2 VIEW  COMPARISON:  05/20/2013.  FINDINGS: Trachea is midline. Heart size stable. Probable scarring in the medial aspects of both lower lobes, unchanged. Lungs are otherwise clear. No pleural fluid. Left hemidiaphragm is mildly elevated.  IMPRESSION: No acute findings.   Electronically Signed   By: Leanna Battles M.D.   On: 12/12/2013 11:20   Ct Angio Chest Pe W/cm &/or Wo Cm  12/12/2013   CLINICAL DATA:  Short of breath, COPD, asthma  EXAM: CT ANGIOGRAPHY CHEST WITH CONTRAST  TECHNIQUE: Multidetector CT imaging of the chest was performed using the standard protocol during bolus administration of intravenous contrast. Multiplanar CT image reconstructions and MIPs were obtained to evaluate the vascular anatomy.  CONTRAST:  OMNIPAQUE IOHEXOL 350 MG/ML SOLN  COMPARISON:  None.  FINDINGS: Satisfactory opacification of pulmonary arteries noted, and there is no evidence of pulmonary emboli. Adequate contrast opacification of the thoracic aorta with no evidence of dissection, aneurysm, or stenosis. There is no pleural or pericardial effusion. No hilar or mediastinal adenopathy. Small subpleural blebs in both upper lobes. Scarring or subsegmental atelectasis anteromedially in the right middle lobe. 4 mm pleural-based right middle lobe peripheral nodule image 58/7. Lungs otherwise clear. Brachiocephalic arch anatomy without proximal stenosis. Thoracic spine and sternum intact. Visualized portions of upper abdomen unremarkable.  Review of the MIP images confirms the above findings.  IMPRESSION: 1. Negative for acute PE or thoracic aortic dissection. 2. 4 mm right middle lobe pleural-based nodule. Given risk factors for bronchogenic  carcinoma, follow-up chest CT at 1 year is recommended. This recommendation follows the consensus statement: "Guidelines for Management of Small Pulmonary Nodules Detected on CT Scans: A Statement from the Fleischner Society" as published in Radiology 2005; 237:395-400. Available online at: DietDisorder.cz.   Electronically Signed   By: Oley Balm M.D.   On: 12/12/2013 15:31    Microbiology: Recent Results (from the past 240 hour(s))  CULTURE, BLOOD (ROUTINE X 2)     Status: None   Collection  Time    12/12/13 11:45 AM      Result Value Ref Range Status   Specimen Description BLOOD RIGHT ANTECUBITAL   Final   Special Requests BOTTLES DRAWN AEROBIC AND ANAEROBIC 5CCS   Final   Culture  Setup Time     Final   Value: 12/12/2013 17:21     Performed at [facility]   Culture     Final   Value:        BLOOD CULTURE RECEIVED NO GROWTH TO DATE CULTURE WILL BE HELD FOR 5 DAYS BEFORE ISSUING A FINAL NEGATIVE REPORT     Performed at [facility]   Report Status PENDING   Incomplete  CULTURE, BLOOD (ROUTINE X 2)     Status: None   Collection Time    12/12/13 11:50 AM      Result Value Ref Range Status   Specimen Description BLOOD RIGHT FOREARM   Final   Special Requests BOTTLES DRAWN AEROBIC ONLY 5CCS   Final   Culture  Setup Time     Final   Value: 12/12/2013 17:21     Performed at [facility]   Culture     Final   Value:        BLOOD CULTURE RECEIVED NO GROWTH TO DATE CULTURE WILL BE HELD FOR 5 DAYS BEFORE ISSUING A FINAL NEGATIVE REPORT     Performed at [facility]   Report Status PENDING   Incomplete     Labs: Basic Metabolic Panel:  Recent Labs Lab 12/12/13 1006 12/13/13 0613 12/13/13 2132 12/14/13 0644  NA 137 137  --  138  K 4.2 5.4*  --  4.9  CL 98 98  --  97  CO2 30 24  --  29  GLUCOSE 266* 287* 450* 324*  BUN 10 15  --  17  CREATININE 0.86 0.75  --  0.79  CALCIUM 8.9 9.6  --  9.4   Liver  Function Tests: No results found for this basename: AST, ALT, ALKPHOS, BILITOT, PROT, ALBUMIN,  in the last 168 hours No results found for this basename: LIPASE, AMYLASE,  in the last 168 hours No results found for this basename: AMMONIA,  in the last 168 hours CBC:  Recent Labs Lab 12/12/13 1006 12/13/13 0613  WBC 5.7 9.9  HGB 15.3 15.2  HCT 48.8 47.1  MCV 94.8 93.8  PLT 204 202   Cardiac Enzymes: No results found for this basename: CKTOTAL, CKMB, CKMBINDEX, TROPONINI,  in the last 168 hours BNP: BNP (last 3 results)  Recent Labs  12/12/13 1006  PROBNP 43.1   CBG:  Recent Labs Lab 12/13/13 0738 12/13/13 1358 12/13/13 1640 12/13/13 2048 12/14/13 0742  GLUCAP 282* 382* 421* 430* 292*       Signed:  Sherrine Salberg  [facility] 12/14/2013, 10:08 AM

## 2013-12-14 NOTE — Progress Notes (Signed)
Inpatient Diabetes Program Recommendations  AACE/[name]: New Consensus Statement on Inpatient Glycemic Control (2013)  Target Ranges:  Prepandial:   less than 140 mg/dL      Peak postprandial:   less than 180 mg/dL (1-2 hours)      Critically ill patients:  140 - 180 mg/dL     Results for Chris [name], Chris [name] (MRN [id number]) as of 12/14/2013 10:07  Ref. Range 12/13/2013 07:38 12/13/2013 13:58 12/13/2013 16:40 12/13/2013 20:48  Glucose-Capillary Latest Range: 70-99 mg/dL 657282 (H) 846382 (H) 962421 (H) 430 (H)    Results for Chris [name], Chris [name] (MRN [id number]) as of 12/14/2013 10:07  Ref. Range 12/14/2013 07:42  Glucose-Capillary Latest Range: 70-99 mg/dL 401292 (H)    Results for Chris [name], Chris [name] (MRN [id number]) as of 12/14/2013 10:07  Ref. Range 12/12/2013 19:30  Hemoglobin A1C Latest Range: <5.7 % 8.5 (H)     Admitted with SOB and back pain.  History of DM2, COPD, +042.  Home DM Meds: None   **[facility]  Note that IV Solumedrol decreased to 60 mg Q12 hours today (was 80 mg Q8 hours yesterday).  CBGs extremely elevated.  **Patient was started on Lantus 10 units QHS last night and was also started on Novolog Resistant SSI and Novolog 6 units meal coverage yesterday at 9am.   MD- Please consider the following:  1. Increase Lantus to 15 units QHS 2. Increase Novolog meal coverage to Novolog 8 units tid with meals 3. Patient likely needs oral diabetes agent for home given elevated A1c of 8.5% this admission.  Not taking any DM Meds prior to admit.  Creatinine and GFR WNL.  Could start Metformin 500 mg bid for home use at time of d/c.   Will follow Ambrose FinlandJeannine Johnston Vinaya Sancho RN, MSN, CDE Diabetes Coordinator Inpatient Diabetes Program Team Pager: [phone] (8a-10p)

## 2013-12-14 NOTE — Plan of Care (Signed)
Problem: Food- and Nutrition-Related Knowledge Deficit (NB-1.1) Goal: Nutrition education Formal process to instruct or train a patient/client in a skill or to impart knowledge to help patients/clients voluntarily manage or modify food choices and eating behavior to maintain or improve health. Outcome: Completed/Met Date Met:  12/14/13  RD consulted for nutrition education regarding diabetes. Pt confirms that he has DM but recently dx, as previously he was pre-diabetic. Pt reports that he drinks a lot of soda, eats a lot of candy and fried foods. Pt is motivated to change, however he is reluctant to make certain food changes. Discussed potential complications of uncontrolled DM. Pt verbalized understanding.    Lab Results  Component Value Date    HGBA1C 8.5* 12/12/2013    RD provided "Carbohydrate Counting for People with Diabetes" handout from the Academy of Nutrition and Dietetics. Discussed different food groups and their effects on blood sugar, emphasizing carbohydrate-containing foods. Provided list of carbohydrates and recommended serving sizes of common foods.  Discussed importance of controlled and consistent carbohydrate intake throughout the day. Provided examples of ways to balance meals/snacks and encouraged intake of high-fiber, whole grain complex carbohydrates. Teach back method used.  Expect fair compliance.  Body mass index is 39.78 kg/(m^2). Pt meets criteria for Obese Class II based on current BMI.  Current diet order is Carbohydrate Modified Medium, patient is consuming approximately 75-100% of meals at this time. Labs and medications reviewed. No further nutrition interventions warranted at this time. RD contact information provided. If additional nutrition issues arise, please re-consult RD.  Inda Coke MS, RD, LDN Inpatient Registered Dietitian Pager: [phone] After-hours pager: [phone]

## 2013-12-18 LAB — CULTURE, BLOOD (ROUTINE X 2)
CULTURE: NO GROWTH
Culture: NO GROWTH

## 2014-01-04 ENCOUNTER — Telehealth: Payer: Self-pay | Admitting: *Deleted

## 2014-01-04 NOTE — Telephone Encounter (Signed)
Returning patient call in regards to changing pain medication. Unable to leave a message (VM not set up). Reather Laurence, RN

## 2014-01-05 ENCOUNTER — Other Ambulatory Visit: Payer: Self-pay | Admitting: Internal Medicine

## 2014-01-10 ENCOUNTER — Emergency Department (INDEPENDENT_AMBULATORY_CARE_PROVIDER_SITE_OTHER): Payer: Medicaid Other

## 2014-01-10 ENCOUNTER — Encounter (HOSPITAL_COMMUNITY): Payer: Self-pay | Admitting: Emergency Medicine

## 2014-01-10 ENCOUNTER — Emergency Department (HOSPITAL_COMMUNITY)
Admission: EM | Admit: 2014-01-10 | Discharge: 2014-01-10 | Disposition: A | Discharge status: Home / Self Care | Payer: Medicaid Other | Source: Home / Self Care | Attending: Family Medicine | Admitting: Family Medicine

## 2014-01-10 DIAGNOSIS — G8929 Other chronic pain: Secondary | ICD-10-CM

## 2014-01-10 DIAGNOSIS — M545 Low back pain, unspecified: Secondary | ICD-10-CM

## 2014-01-10 MED ORDER — CYCLOBENZAPRINE HCL 5 MG PO TABS: 5.0000 mg | ORAL_TABLET | Freq: Three times a day (TID) | ORAL | Status: DC | PRN

## 2014-01-10 MED ORDER — KETOROLAC TROMETHAMINE 30 MG/ML IJ SOLN
30.0000 mg | Freq: Once | INTRAMUSCULAR | Status: AC
  Administered 2014-01-10: 30 mg via INTRAMUSCULAR

## 2014-01-10 MED ORDER — KETOROLAC TROMETHAMINE 10 MG PO TABS: 10.0000 mg | ORAL_TABLET | Freq: Four times a day (QID) | ORAL | Status: DC | PRN

## 2014-01-10 MED ORDER — KETOROLAC TROMETHAMINE 30 MG/ML IJ SOLN
INTRAMUSCULAR | Status: AC
  Filled 2014-01-10: qty 1

## 2014-01-10 NOTE — ED Notes (Signed)
Pt  Has  Symptoms  Of   Back  Pain       Worse  On  Movement          And  Certain posistions       Pt  Is  On  Oxygen  And  Still  Smokes                He  Reports  Pain  For  Over  1  Month     And   Reports     Pain  Not  releived  By     Tramadol
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
## 2014-01-10 NOTE — Discharge Instructions (Signed)
Heat to back , use medicines as prescribed, see orthopedist for back therapy

## 2014-01-17 ENCOUNTER — Encounter: Payer: Self-pay | Admitting: Internal Medicine

## 2014-01-17 ENCOUNTER — Ambulatory Visit: Payer: Medicaid Other | Attending: Internal Medicine | Admitting: Internal Medicine

## 2014-01-17 VITALS — BP 149/93 | HR 87 | Temp 98.5°F | Resp 14 | Ht 69.0 in | Wt 275.0 lb

## 2014-01-17 DIAGNOSIS — M545 Low back pain, unspecified: Secondary | ICD-10-CM | POA: Diagnosis not present

## 2014-01-17 DIAGNOSIS — IMO0002 Reserved for concepts with insufficient information to code with codable children: Secondary | ICD-10-CM | POA: Insufficient documentation

## 2014-01-17 DIAGNOSIS — G4733 Obstructive sleep apnea (adult) (pediatric): Secondary | ICD-10-CM | POA: Diagnosis not present

## 2014-01-17 DIAGNOSIS — J4489 Other specified chronic obstructive pulmonary disease: Secondary | ICD-10-CM | POA: Insufficient documentation

## 2014-01-17 DIAGNOSIS — J449 Chronic obstructive pulmonary disease, unspecified: Secondary | ICD-10-CM

## 2014-01-17 DIAGNOSIS — Z21 Asymptomatic human immunodeficiency virus [HIV] infection status: Secondary | ICD-10-CM | POA: Insufficient documentation

## 2014-01-17 DIAGNOSIS — Z794 Long term (current) use of insulin: Secondary | ICD-10-CM | POA: Insufficient documentation

## 2014-01-17 DIAGNOSIS — E118 Type 2 diabetes mellitus with unspecified complications: Secondary | ICD-10-CM

## 2014-01-17 DIAGNOSIS — E1165 Type 2 diabetes mellitus with hyperglycemia: Secondary | ICD-10-CM | POA: Diagnosis not present

## 2014-01-17 DIAGNOSIS — Z9981 Dependence on supplemental oxygen: Secondary | ICD-10-CM | POA: Insufficient documentation

## 2014-01-17 DIAGNOSIS — J9610 Chronic respiratory failure, unspecified whether with hypoxia or hypercapnia: Secondary | ICD-10-CM | POA: Insufficient documentation

## 2014-01-17 DIAGNOSIS — F17200 Nicotine dependence, unspecified, uncomplicated: Secondary | ICD-10-CM | POA: Insufficient documentation

## 2014-01-17 DIAGNOSIS — J441 Chronic obstructive pulmonary disease with (acute) exacerbation: Secondary | ICD-10-CM

## 2014-01-17 DIAGNOSIS — IMO0001 Reserved for inherently not codable concepts without codable children: Secondary | ICD-10-CM

## 2014-01-17 DIAGNOSIS — B20 Human immunodeficiency virus [HIV] disease: Secondary | ICD-10-CM

## 2014-01-17 DIAGNOSIS — E119 Type 2 diabetes mellitus without complications: Secondary | ICD-10-CM

## 2014-01-17 LAB — GLUCOSE, POCT (MANUAL RESULT ENTRY): POC GLUCOSE: 126 mg/dL — AB (ref 70–99)

## 2014-01-17 MED ORDER — ACETAMINOPHEN-CODEINE #3 300-30 MG PO TABS
1.0000 | ORAL_TABLET | ORAL | Status: DC | PRN
Start: 2014-01-17 — End: 2014-03-23

## 2014-01-17 MED ORDER — INSULIN ASPART 100 UNIT/ML ~~LOC~~ SOLN: 0.0000 [IU] | Freq: Three times a day (TID) | SUBCUTANEOUS | Status: DC

## 2014-01-17 MED ORDER — BUDESONIDE-FORMOTEROL FUMARATE 160-4.5 MCG/ACT IN AERO: 2.0000 | INHALATION_SPRAY | Freq: Two times a day (BID) | RESPIRATORY_TRACT | Status: DC

## 2014-01-17 MED ORDER — ALBUTEROL SULFATE HFA 108 (90 BASE) MCG/ACT IN AERS: 2.0000 | INHALATION_SPRAY | Freq: Four times a day (QID) | RESPIRATORY_TRACT | Status: DC | PRN

## 2014-01-17 NOTE — Progress Notes (Signed)
Patient ID: Chris [name], male   DOB: 21-Jun-1966, 48 y.o.   MRN: [id number]   Chris [name], is a 48 y.o. male  GEX:[id number]  [id number]:[id number]  DOB - 18-Nov-1965  Chief Complaint  Patient presents with  . Follow-up        Subjective:   Chris [name] a 48 y.o. male here today for a follow up visit. Patient has history of COPD, HIV, diabetes mellitus on home oxygen. Patient was seen at the urgent care for the last week for low back pain, lumbar spine x-ray showed minimal spondylosis with no compression fracture or subluxation. Pt is here following up on his COPD. Pt reports having pain in his lower back that started about a month and a half ago. Pt has a lump on his upper back that has been there over a year. He is doing well with his HIV treatment. He reports no side effects to medications. He continues to smoke heavily about one pack of cigarettes per day despite COPD on home oxygen use. He denies use of alcohol. Immunizations are up-to-date. Patient has No headache, No chest pain, No abdominal pain - No Nausea, No new weakness tingling or numbness,  No problems updated.  ALLERGIES: Allergies  Allergen Reactions  . Bactrim [Sulfamethoxazole-Tmp Ds] Hives    PAST MEDICAL HISTORY: Past Medical History  Diagnosis Date  . COPD (chronic obstructive pulmonary disease)   . HIV disease   . Emphysema   . Diabetes mellitus without complication     MEDICATIONS AT HOME: Prior to Admission medications   Medication Sig Start Date End Date Taking? Authorizing Provider  albuterol (PROVENTIL HFA;VENTOLIN HFA) 108 (90 BASE) MCG/ACT inhaler Inhale 2 puffs into the lungs every 6 (six) hours as needed for wheezing. 01/17/14  Yes Jeanann Lewandowsky, MD  albuterol (PROVENTIL) (5 MG/ML) 0.5% nebulizer solution Take 0.5 mLs (2.5 mg total) by nebulization every 6 (six) hours as needed for wheezing. 05/20/13  Yes Dorothea Ogle, MD  budesonide-formoterol [facility]) 160-4.5 MCG/ACT inhaler Inhale 2 puffs into the  lungs 2 (two) times daily. 01/17/14  Yes Jeanann Lewandowsky, MD  darunavir-cobicistat (PREZCOBIX) 800-150 MG per tablet Take 1 tablet by mouth daily. Swallow whole. Do NOT crush, break or chew tablets. Take with food.   Yes Historical Provider, MD  dolutegravir (TIVICAY) 50 MG tablet Take 1 tablet (50 mg total) by mouth daily. 10/25/13  Yes Judyann Munson, MD  emtricitabine-tenofovir (TRUVADA) 200-300 MG per tablet Take 1 tablet by mouth daily. 06/07/13  Yes Judyann Munson, MD  insulin aspart (NOVOLOG) 100 UNIT/ML injection Inject 0-20 Units into the skin 3 (three) times daily with meals. CBG 70 - 120: 0 units CBG 121 - 150: 3 units CBG 151 - 200: 4 units CBG 201 - 250: 7 units CBG 251 - 300: 11 units CBG 301 - 350: 15 units CBG 351 - 400: 20 units 01/17/14  Yes Jeanann Lewandowsky, MD  insulin glargine (LANTUS) 100 UNIT/ML injection Inject 0.1 mLs (10 Units total) into the skin at bedtime. 12/14/13  Yes Maryann Mikhail, DO  lisinopril (PRINIVIL,ZESTRIL) 5 MG tablet Take 1 tablet (5 mg total) by mouth daily. 12/14/13  Yes Maryann Mikhail, DO  tiotropium (SPIRIVA) 18 MCG inhalation capsule Place 1 capsule (18 mcg total) into inhaler and inhale daily. 11/24/13  Yes Jeanann Lewandowsky, MD  acetaminophen-codeine (TYLENOL #3) 300-30 MG per tablet Take 1 tablet by mouth every 4 (four) hours as needed. 01/17/14   Jeanann Lewandowsky, MD  cyclobenzaprine (FLEXERIL) 5 MG tablet Take  1 tablet (5 mg total) by mouth 3 (three) times daily as needed for muscle spasms. 01/10/14   Linna Hoff, MD  dapsone 100 MG tablet Take 1 tablet (100 mg total) by mouth daily. 06/07/13   Judyann Munson, MD  guaiFENesin (MUCINEX) 600 MG 12 hr tablet Take 2 tablets (1,200 mg total) by mouth 2 (two) times daily. 12/14/13   Maryann Mikhail, DO  ketorolac (TORADOL) 10 MG tablet Take 1 tablet (10 mg total) by mouth every 6 (six) hours as needed. For back pain 01/10/14   Linna Hoff, MD  levofloxacin (LEVAQUIN) 750 MG tablet Take 1 tablet (750 mg total)  by mouth daily. 12/14/13   Maryann Mikhail, DO  predniSONE (STERAPRED UNI-PAK) 10 MG tablet Take by mouth daily. Prednisone dosing: Take  Prednisone 60mg  (6 tabs) x 3 days, then taper to 50mg  (5 tabs) x 3 days, then 40mg  (4 tabs) x 3days, then 30mg  (3 tabs) x 3 days, then 20mg  (2 tabs) x 3 days, then 10mg  (1 tab) x 3days, then OFF. 12/14/13   Maryann Mikhail, DO     Objective:   Filed Vitals:   01/17/14 1214  BP: 149/93  Pulse: 87  Temp: 98.5 F (36.9 C)  TempSrc: Oral  Resp: 14  Height: 5\' 9"  (1.753 m)  Weight: 275 lb (124.739 kg)  SpO2: 95%    Exam General appearance : Awake, alert, not in any distress. Speech Clear. Not toxic looking, morbidly obese, audible snoring while awake, oxygen by nasal cannula in situ HEENT: Atraumatic and Normocephalic, pupils equally reactive to light and accomodation Neck: supple, no JVD. No cervical lymphadenopathy.  Chest: Reduced air entry bilaterally, no added sounds  CVS: S1 S2 regular, no murmurs.  Abdomen: Bowel sounds present, Non tender and not distended with no gaurding, rigidity or rebound. Extremities: B/L Lower Ext shows no edema, both legs are warm to touch Neurology: Awake alert, and oriented X 3, CN II-XII intact, Non focal  Data Review Lab Results  Component Value Date   HGBA1C 8.5* 12/12/2013   HGBA1C 7.0 05/20/2013   HGBA1C 6.2* 01/12/2013     Assessment & Plan   1. Diabetes  - Glucose (CBG)  2. COPD mixed type Continue home oxygen Continue inhalers and albuterol nebulization.  - albuterol (PROVENTIL HFA;VENTOLIN HFA) 108 (90 BASE) MCG/ACT inhaler; Inhale 2 puffs into the lungs every 6 (six) hours as needed for wheezing.  Dispense: 2 Inhaler; Refill: 3 - budesonide-formoterol (SYMBICORT) 160-4.5 MCG/ACT inhaler; Inhale 2 puffs into the lungs 2 (two) times daily.  Dispense: 2 Inhaler; Refill: 2  3. Uncontrolled diabetes mellitus Patient has been counseled extensively about diabetes and its complications, and especially  consequences of uncontrolled diabetes.  4. HIV disease Follow up with infectious disease specialist as scheduled Continue HIV medications.  5. Chronic respiratory failure Continue home oxygen  6. OSA (obstructive sleep apnea) Sleep study   Patient was counseled extensively about smoking cessation Patient was counseled about nutrition and exercise as tolerated.  Return in about 3 months (around 04/19/2014), or if symptoms worsen or fail to improve, for Hemoglobin A1C and Follow up, DM, Follow up Pain and comorbidities.  The patient was given clear instructions to go to ER or return to medical center if symptoms don't improve, worsen or new problems develop. The patient verbalized understanding. The patient was told to call to get lab results if they haven't heard anything in the next week.   This note has been created with Dragon speech recognition  Scientist, product/process developmentsoftware and smart phrase technology. Any transcriptional errors are unintentional.    Jeanann Lewandowskylugbemiga Rayneisha Bouza, MD, MHA, FACP, [facility] [place], [place] [phone]   01/17/2014, 12:52 PM

## 2014-01-17 NOTE — Patient Instructions (Signed)
Diabetes Meal Planning Guide The diabetes meal planning guide is a tool to help you plan your meals and snacks. It is important for people with diabetes to manage their blood glucose (sugar) levels. Choosing the right foods and the right amounts throughout your day will help control your blood glucose. Eating right can even help you improve your blood pressure and reach or maintain a healthy weight. CARBOHYDRATE COUNTING MADE EASY When you eat carbohydrates, they turn to sugar. This raises your blood glucose level. Counting carbohydrates can help you control this level so you feel better. When you plan your meals by counting carbohydrates, you can have more flexibility in what you eat and balance your medicine with your food intake. Carbohydrate counting simply means adding up the total amount of carbohydrate grams in your meals and snacks. Try to eat about the same amount at each meal. Foods with carbohydrates are listed below. Each portion below is 1 carbohydrate serving or 15 grams of carbohydrates. Ask your dietician how many grams of carbohydrates you should eat at each meal or snack. Grains and Starches  1 slice bread.   English muffin or hotdog/hamburger bun.   cup cold cereal (unsweetened).   cup cooked pasta or rice.   cup starchy vegetables (corn, potatoes, peas, beans, winter squash).  1 tortilla (6 inches).   bagel.  1 waffle or pancake (size of a CD).   cup cooked cereal.  4 to 6 small crackers. *Whole grain is recommended. Fruit  1 cup fresh unsweetened berries, melon, papaya, pineapple.  1 small fresh fruit.   banana or mango.   cup fruit juice (4 oz unsweetened).   cup canned fruit in natural juice or water.  2 tbs dried fruit.  12 to 15 grapes or cherries. Milk and Yogurt  1 cup fat-free or 1% milk.  1 cup soy milk.  6 oz light yogurt with sugar-free sweetener.  6 oz low-fat soy yogurt.  6 oz plain yogurt. Vegetables  1 cup raw or  cup  cooked is counted as 0 carbohydrates or a "free" food.  If you eat 3 or more servings at 1 meal, count them as 1 carbohydrate serving. Other Carbohydrates   oz chips or pretzels.   cup ice cream or frozen yogurt.   cup sherbet or sorbet.  2 inch square cake, no frosting.  1 tbs honey, sugar, jam, jelly, or syrup.  2 small cookies.  3 squares of graham crackers.  3 cups popcorn.  6 crackers.  1 cup broth-based soup.  Count 1 cup casserole or other mixed foods as 2 carbohydrate servings.  Foods with less than 20 calories in a serving may be counted as 0 carbohydrates or a "free" food. You may want to purchase a book or computer software that lists the carbohydrate gram counts of different foods. In addition, the nutrition facts panel on the labels of the foods you eat are a good source of this information. The label will tell you how big the serving size is and the total number of carbohydrate grams you will be eating per serving. Divide this number by 15 to obtain the number of carbohydrate servings in a portion. Remember, 1 carbohydrate serving equals 15 grams of carbohydrate. SERVING SIZES Measuring foods and serving sizes helps you make sure you are getting the right amount of food. The list below tells how big or small some common serving sizes are.  1 oz.........4 stacked dice.  3 oz.........Deck of cards.  1 tsp........Tip   of little finger.  1 tbs........Thumb.  2 tbs........Golf ball.   cup.......Half of a fist.  1 cup........A fist. SAMPLE DIABETES MEAL PLAN Below is a sample meal plan that includes foods from the grain and starches, dairy, vegetable, fruit, and meat groups. A dietician can individualize a meal plan to fit your calorie needs and tell you the number of servings needed from each food group. However, controlling the total amount of carbohydrates in your meal or snack is more important than making sure you include all of the food groups at every  meal. You may interchange carbohydrate containing foods (dairy, starches, and fruits). The meal plan below is an example of a 2000 calorie diet using carbohydrate counting. This meal plan has 17 carbohydrate servings. Breakfast  1 cup oatmeal (2 carb servings).   cup light yogurt (1 carb serving).  1 cup blueberries (1 carb serving).   cup almonds. Snack  1 large apple (2 carb servings).  1 low-fat string cheese stick. Lunch  Chicken breast salad.  1 cup spinach.   cup chopped tomatoes.  2 oz chicken breast, sliced.  2 tbs low-fat Italian dressing.  12 whole-wheat crackers (2 carb servings).  12 to 15 grapes (1 carb serving).  1 cup low-fat milk (1 carb serving). Snack  1 cup carrots.   cup hummus (1 carb serving). Dinner  3 oz broiled salmon.  1 cup brown rice (3 carb servings). Snack  1  cups steamed broccoli (1 carb serving) drizzled with 1 tsp olive oil and lemon juice.  1 cup light pudding (2 carb servings). DIABETES MEAL PLANNING WORKSHEET Your dietician can use this worksheet to help you decide how many servings of foods and what types of foods are right for you.  BREAKFAST Food Group and Servings / Carb Servings Grain/Starches __________________________________ Dairy __________________________________________ Vegetable ______________________________________ Fruit ___________________________________________ Meat __________________________________________ Fat ____________________________________________ LUNCH Food Group and Servings / Carb Servings Grain/Starches ___________________________________ Dairy ___________________________________________ Fruit ____________________________________________ Meat ___________________________________________ Fat _____________________________________________ DINNER Food Group and Servings / Carb Servings Grain/Starches ___________________________________ Dairy  ___________________________________________ Fruit ____________________________________________ Meat ___________________________________________ Fat _____________________________________________ SNACKS Food Group and Servings / Carb Servings Grain/Starches ___________________________________ Dairy ___________________________________________ Vegetable _______________________________________ Fruit ____________________________________________ Meat ___________________________________________ Fat _____________________________________________ DAILY TOTALS Starches _________________________ Vegetable ________________________ Fruit ____________________________ Dairy ____________________________ Meat ____________________________ Fat ______________________________ Document Released: 04/24/2005 Document Revised: 10/20/2011 Document Reviewed: 03/05/2009 ExitCare Patient Information 2014 [employer]. Diabetes and Exercise Exercising regularly is important. It is not just about losing weight. It has many health benefits, such as:  Improving your overall fitness, flexibility, and endurance.  Increasing your bone density.  Helping with weight control.  Decreasing your body fat.  Increasing your muscle strength.  Reducing stress and tension.  Improving your overall health. People with diabetes who exercise gain additional benefits because exercise:  Reduces appetite.  Improves the body's use of blood sugar (glucose).  Helps lower or control blood glucose.  Decreases blood pressure.  Helps control blood lipids (such as cholesterol and triglycerides).  Improves the body's use of the hormone insulin by:  Increasing the body's insulin sensitivity.  Reducing the body's insulin needs.  Decreases the risk for heart disease because exercising:  Lowers cholesterol and triglycerides levels.  Increases the levels of good cholesterol (such as high-density lipoproteins [HDL]) in the  body.  Lowers blood glucose levels. YOUR ACTIVITY PLAN  Choose an activity that you enjoy and set realistic goals. Your health care provider or diabetes educator can help you make an activity plan that works for you. You   can break activities into 2 or 3 sessions throughout the day. Doing so is as good as one long session. Exercise ideas include:  Taking the dog for a walk.  Taking the stairs instead of the elevator.  Dancing to your favorite song.  Doing your favorite exercise with a friend. RECOMMENDATIONS FOR EXERCISING WITH TYPE 1 OR TYPE 2 DIABETES   Check your blood glucose before exercising. If blood glucose levels are greater than 240 mg/dL, check for urine ketones. Do not exercise if ketones are present.  Avoid injecting insulin into areas of the body that are going to be exercised. For example, avoid injecting insulin into:  The arms when playing tennis.  The legs when jogging.  Keep a record of:  Food intake before and after you exercise.  Expected peak times of insulin action.  Blood glucose levels before and after you exercise.  The type and amount of exercise you have done.  Review your records with your health care provider. Your health care provider will help you to develop guidelines for adjusting food intake and insulin amounts before and after exercising.  If you take insulin or oral hypoglycemic agents, watch for signs and symptoms of hypoglycemia. They include:  Dizziness.  Shaking.  Sweating.  Chills.  Confusion.  Drink plenty of water while you exercise to prevent dehydration or heat stroke. Body water is lost during exercise and must be replaced.  Talk to your health care provider before starting an exercise program to make sure it is safe for you. Remember, almost any type of activity is better than none. Document Released: 10/18/2003 Document Revised: 03/30/2013 Document Reviewed: 01/04/2013 ExitCare Patient Information 2014 [employer].  

## 2014-01-17 NOTE — Progress Notes (Signed)
Pt is here following up on his COPD. Pt reports having acute pain in his lower back that started about a month and a half ago. Pt has a lump on his upper back that has been there over a year.

## 2014-01-22 IMAGING — US US MISC SOFT TISSUE
1 series · 6 of 6 positions shown · non-contrast
Comparison: None

CLINICAL DATA: 46-year-old male with right breast swelling, pain
and tenderness.  History of previous retroareolar abscesses. HIV
positive.

RIGHT BREAST ULTRASOUND

[Series 1: us misc soft tissue · 0.11mm/px · 6 acquisitions, 6 frames shown]
[im 1/6]
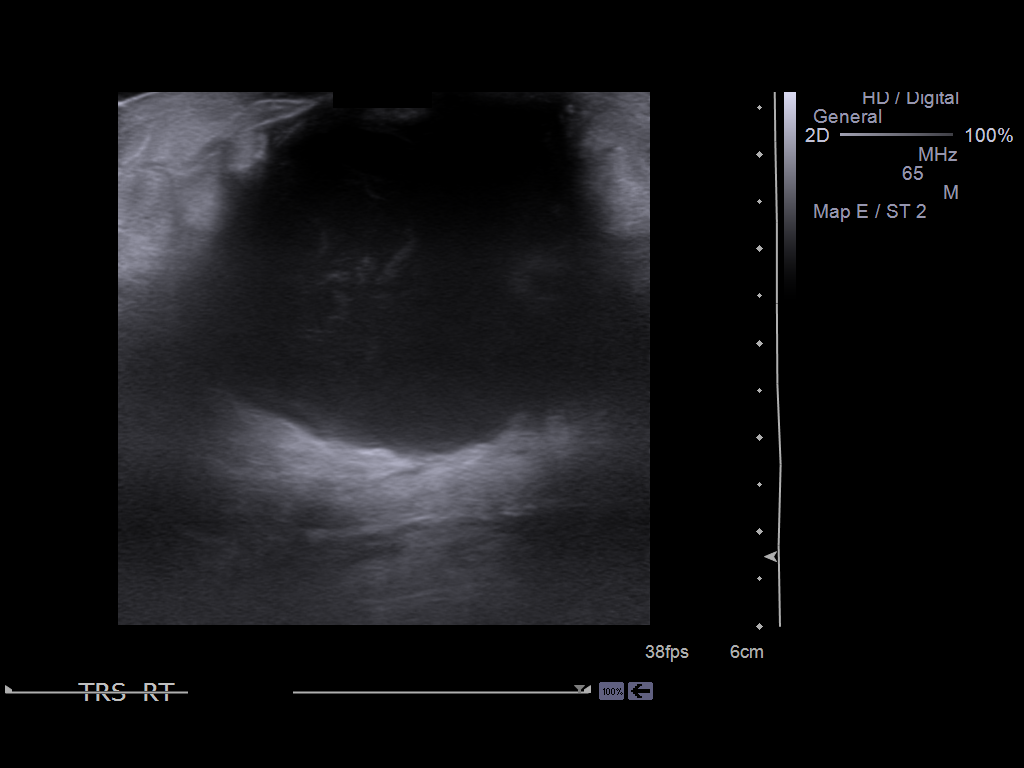
[im 2/6]
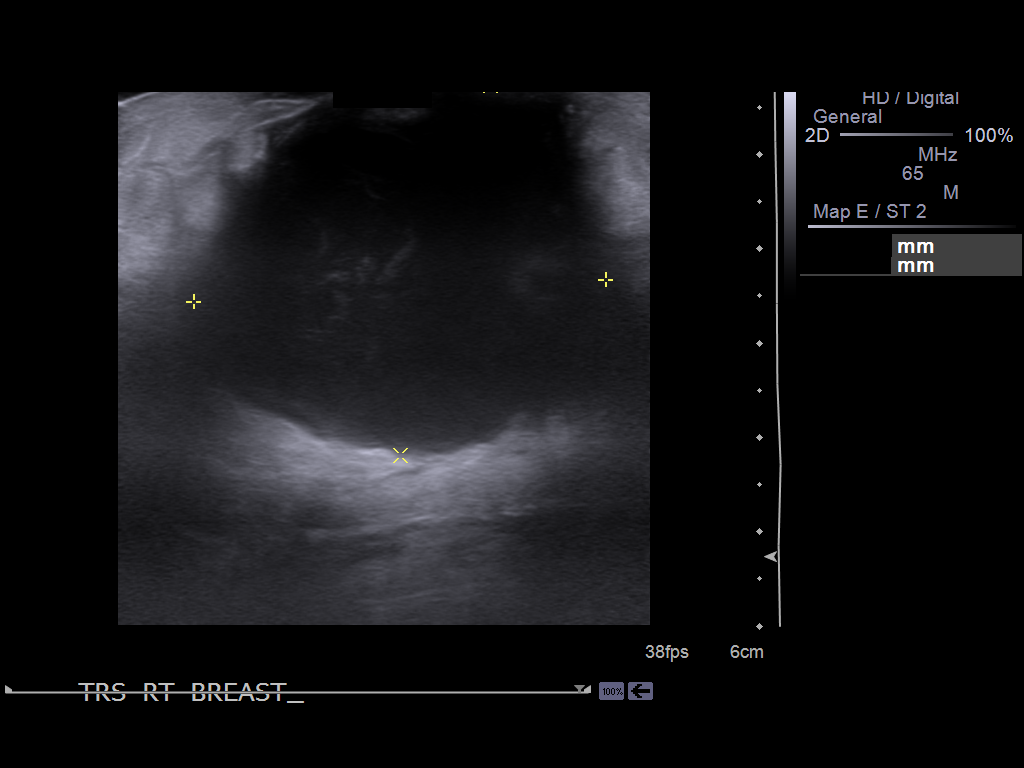
[im 3/6]
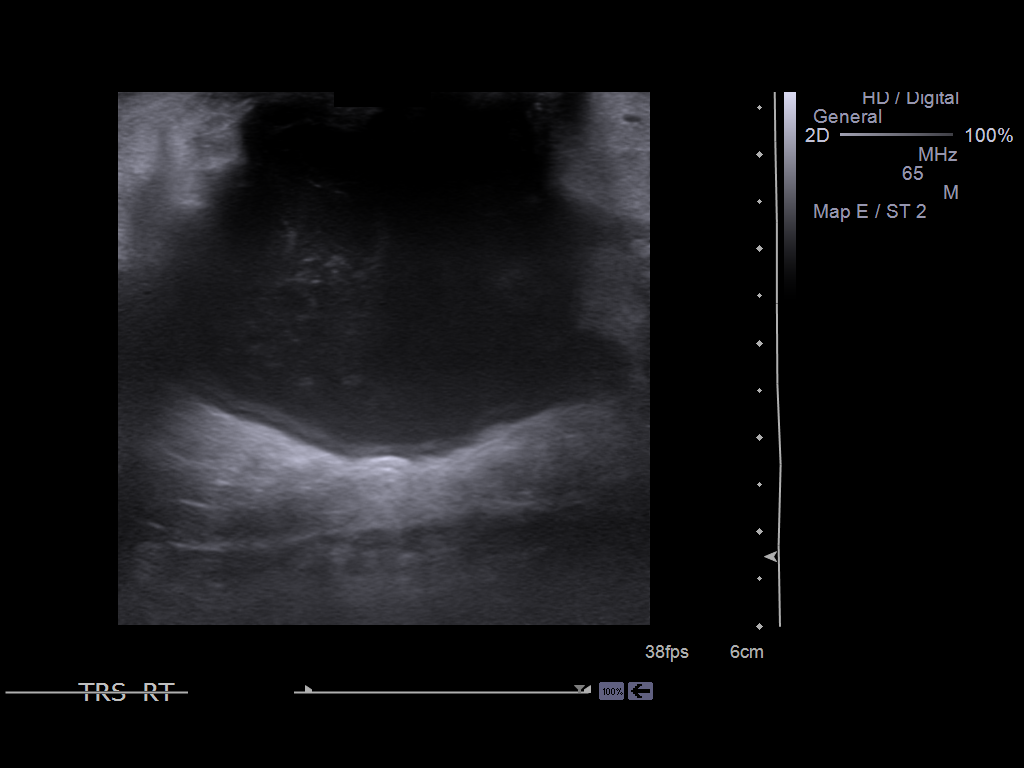
[im 4/6]
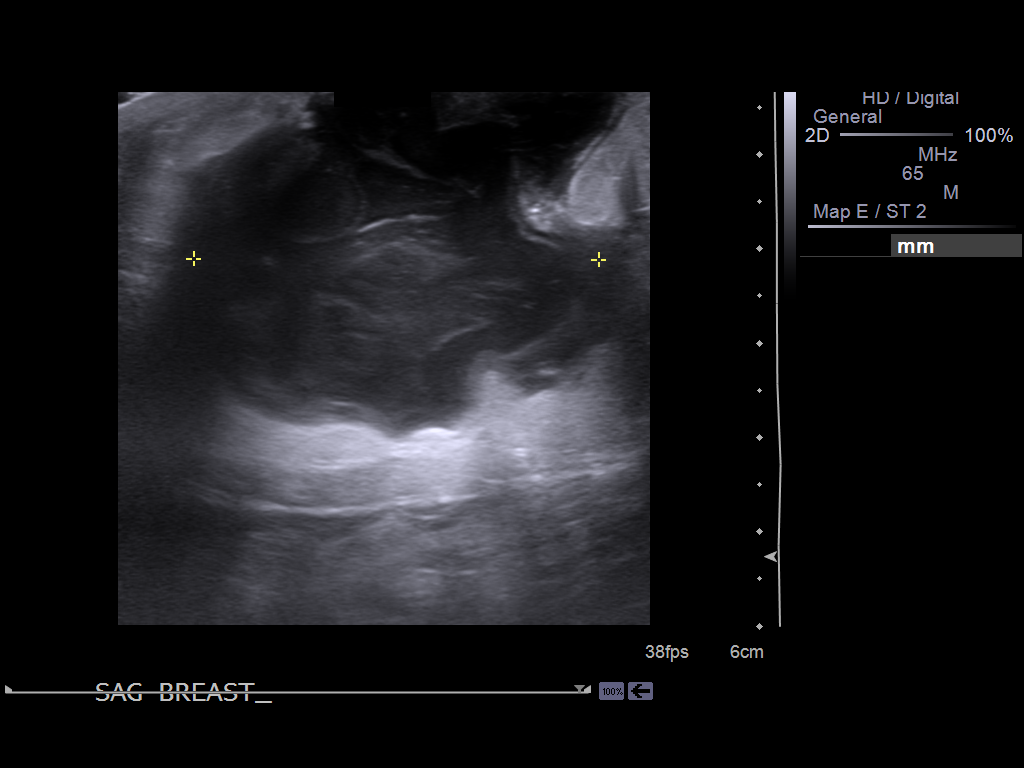
[im 5/6]
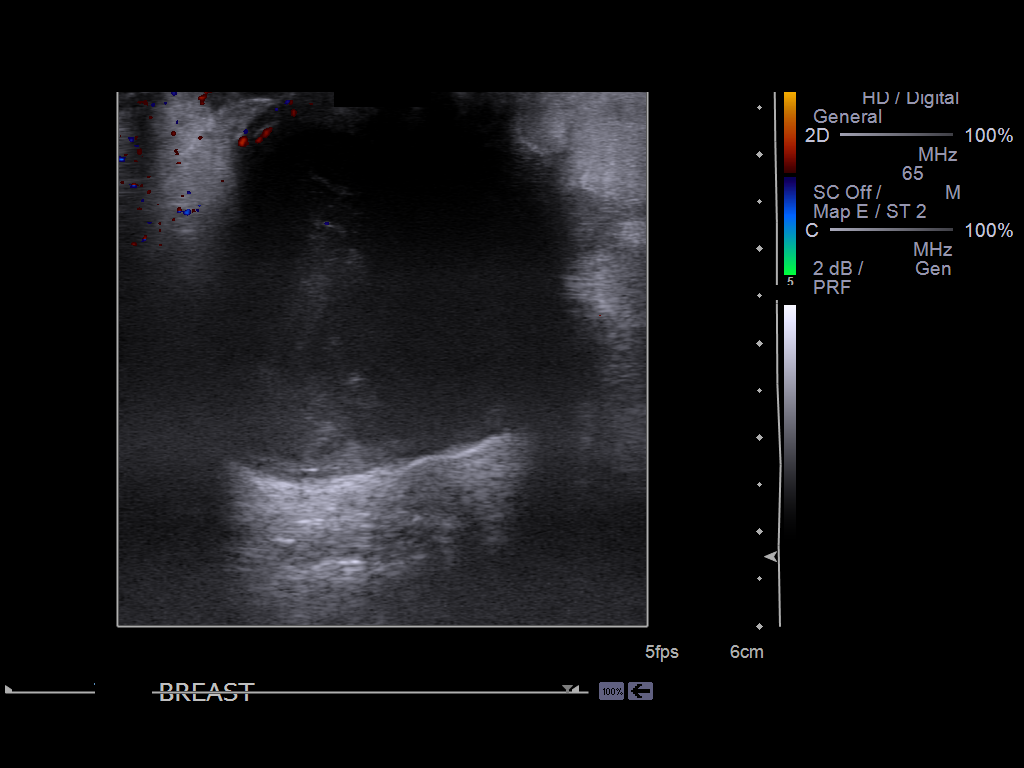
[im 6/6]
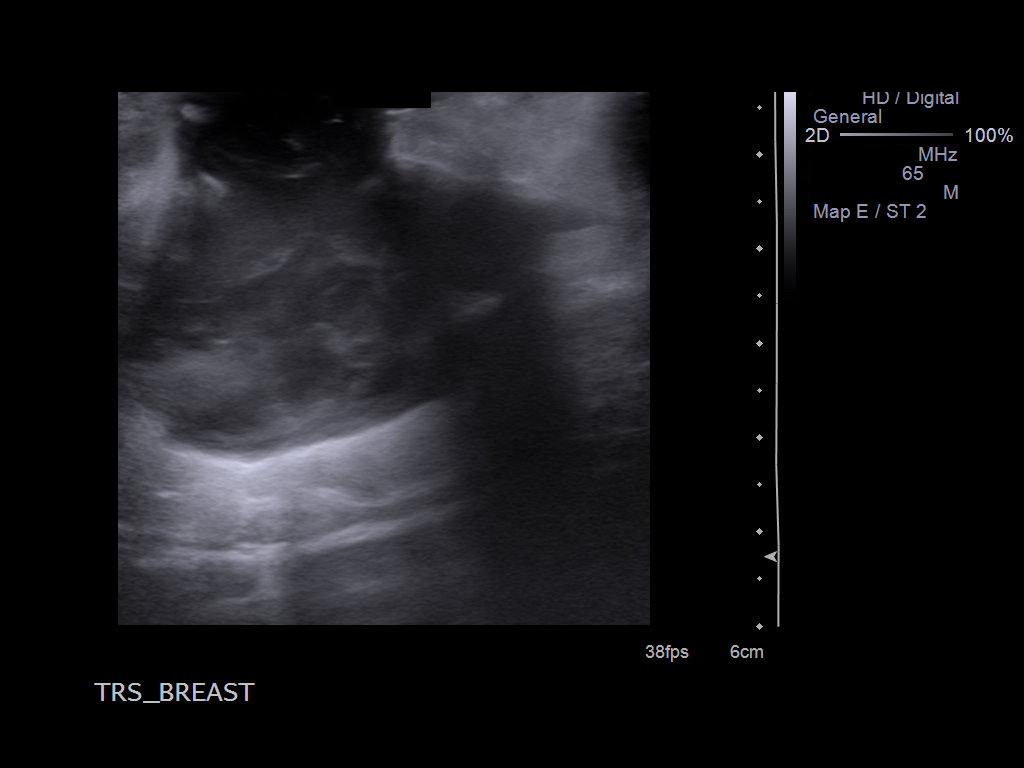

[6 of 6 positions shown; findings below may reference images not displayed]

FINDINGS: Ultrasound is performed, showing a 4.4 x 4 x 4.3 cm
complicated collection in the subareolar right breast without
internal vascular flow - compatible with a abscess.
IMPRESSION: 4.4 x 4 x 4.3 cm complicated collection within the subareolar right
breast compatible with a subareolar abscess.  Clinical follow-up /
surgical consultation is recommended.

## 2014-01-23 ENCOUNTER — Other Ambulatory Visit (HOSPITAL_COMMUNITY)
Admission: RE | Admit: 2014-01-23 | Discharge: 2014-01-23 | Disposition: A | Discharge status: Home / Self Care | Payer: Medicaid Other | Source: Ambulatory Visit | Attending: Infectious Disease | Admitting: Infectious Disease

## 2014-01-23 ENCOUNTER — Other Ambulatory Visit (INDEPENDENT_AMBULATORY_CARE_PROVIDER_SITE_OTHER): Payer: Medicaid Other

## 2014-01-23 DIAGNOSIS — Z113 Encounter for screening for infections with a predominantly sexual mode of transmission: Secondary | ICD-10-CM | POA: Insufficient documentation

## 2014-01-23 DIAGNOSIS — B20 Human immunodeficiency virus [HIV] disease: Secondary | ICD-10-CM

## 2014-01-23 DIAGNOSIS — Z79899 Other long term (current) drug therapy: Secondary | ICD-10-CM

## 2014-01-23 LAB — COMPLETE METABOLIC PANEL WITH GFR
ALT: 20 U/L (ref 0–53)
AST: 15 U/L (ref 0–37)
Albumin: 4.1 g/dL (ref 3.5–5.2)
Alkaline Phosphatase: 43 U/L (ref 39–117)
BILIRUBIN TOTAL: 0.4 mg/dL (ref 0.2–1.2)
BUN: 9 mg/dL (ref 6–23)
CALCIUM: 9.4 mg/dL (ref 8.4–10.5)
CHLORIDE: 99 meq/L (ref 96–112)
CO2: 31 mEq/L (ref 19–32)
CREATININE: 1.01 mg/dL (ref 0.50–1.35)
GFR, Est African American: >89 mL/min
GFR, Est Non African American: 88 mL/min
Glucose, Bld: 181 mg/dL — ABNORMAL HIGH (ref 70–99)
Potassium: 4.7 mEq/L (ref 3.5–5.3)
Sodium: 136 mEq/L (ref 135–145)
Total Protein: 8 g/dL (ref 6.0–8.3)

## 2014-01-23 LAB — CBC WITH DIFFERENTIAL/PLATELET
Basophils Absolute: 0.1 10*3/uL (ref 0.0–0.1)
Basophils Relative: 1 % (ref 0–1)
EOS PCT: 1 % (ref 0–5)
Eosinophils Absolute: 0.1 10*3/uL (ref 0.0–0.7)
HCT: 44.8 % (ref 39.0–52.0)
Hemoglobin: 14.6 g/dL (ref 13.0–17.0)
Lymphocytes Relative: 42 % (ref 12–46)
Lymphs Abs: 2.9 10*3/uL (ref 0.7–4.0)
MCH: 28.9 pg (ref 26.0–34.0)
MCHC: 32.6 g/dL (ref 30.0–36.0)
MCV: 88.7 fL (ref 78.0–100.0)
Monocytes Absolute: 0.8 10*3/uL (ref 0.1–1.0)
Monocytes Relative: 12 % (ref 3–12)
Neutro Abs: 3.1 10*3/uL (ref 1.7–7.7)
Neutrophils Relative %: 44 % (ref 43–77)
Platelets: 260 10*3/uL (ref 150–400)
RBC: 5.05 MIL/uL (ref 4.22–5.81)
RDW: 13.5 % (ref 11.5–15.5)
WBC: 7 10*3/uL (ref 4.0–10.5)

## 2014-01-23 LAB — LIPID PANEL
CHOL/HDL RATIO: 6.9 ratio
CHOLESTEROL: 254 mg/dL — AB (ref 0–200)
HDL: 37 mg/dL — AB (ref >39)
LDL Cholesterol: 190 mg/dL — ABNORMAL HIGH (ref 0–99)
Triglycerides: 133 mg/dL (ref <150)
VLDL: 27 mg/dL (ref 0–40)

## 2014-01-24 ENCOUNTER — Other Ambulatory Visit: Payer: Medicaid Other

## 2014-01-24 LAB — T-HELPER CELL (CD4) - (RCID CLINIC ONLY)
CD4 % Helper T Cell: 6 % — ABNORMAL LOW (ref 33–55)
CD4 T Cell Abs: 180 /uL — ABNORMAL LOW (ref 400–2700)

## 2014-01-24 LAB — HIV-1 RNA QUANT-NO REFLEX-BLD
HIV 1 RNA Quant: <20 copies/mL (ref <20)
HIV-1 RNA Quant, Log: <1.3 {Log} (ref <1.30)

## 2014-01-24 LAB — RPR

## 2014-01-26 ENCOUNTER — Telehealth: Payer: Self-pay | Admitting: Licensed Clinical Social Worker

## 2014-01-26 NOTE — Telephone Encounter (Signed)
He needs 2 grams of metronidazole x 1 and his wife, partners need to be tested. I didn't think we tested routinely he must have asked to be tested

## 2014-01-26 NOTE — Telephone Encounter (Signed)
Patient has Trichomonas need to know what the treatment plan is.

## 2014-01-27 NOTE — Telephone Encounter (Signed)
That's correct his wife told him she was positive and told him to get tested. I will call the prescription in.

## 2014-02-03 ENCOUNTER — Other Ambulatory Visit: Payer: Self-pay | Admitting: Internal Medicine

## 2014-02-08 ENCOUNTER — Ambulatory Visit (INDEPENDENT_AMBULATORY_CARE_PROVIDER_SITE_OTHER): Payer: Medicaid Other | Admitting: Internal Medicine

## 2014-02-08 ENCOUNTER — Encounter: Payer: Self-pay | Admitting: Internal Medicine

## 2014-02-08 VITALS — BP 118/79 | HR 101 | Temp 98.7°F | Wt 274.0 lb

## 2014-02-08 DIAGNOSIS — L282 Other prurigo: Secondary | ICD-10-CM

## 2014-02-08 DIAGNOSIS — E785 Hyperlipidemia, unspecified: Secondary | ICD-10-CM

## 2014-02-08 DIAGNOSIS — B20 Human immunodeficiency virus [HIV] disease: Secondary | ICD-10-CM

## 2014-02-08 DIAGNOSIS — A599 Trichomoniasis, unspecified: Secondary | ICD-10-CM

## 2014-02-08 MED ORDER — DAPSONE 100 MG PO TABS: 100.0000 mg | ORAL_TABLET | Freq: Every day | ORAL | Status: DC

## 2014-02-08 MED ORDER — METRONIDAZOLE 500 MG PO TABS: 500.0000 mg | ORAL_TABLET | Freq: Two times a day (BID) | ORAL | Status: DC

## 2014-02-08 NOTE — Progress Notes (Signed)
Subjective:    Patient ID: Chris [name] SeenJerome Naff, male    DOB: 04/20/1966, 48 y.o.   MRN: [id number]  HPI Kevin FentonJerome is a 48yo M with HIV, DM, OSA, DM, COPD with o2 dependency on 2LNC. CD 4 count of 180/VL<20 (Jun 2015). While on DLG/truvada/prezcobix changed from RLG/DRVr BID/truvada plus dapsone. He reports doing well with new regimen.  Recent labs show hyperlipidemia with LDL 190, and urine cytology + trichomonas. He reports his wife recently treated  He reports having itching to face and chest x 4wk. Using alcohol on his face to help with symptoms  Current Outpatient Prescriptions on File Prior to Visit  Medication Sig Dispense Refill  . acetaminophen-codeine (TYLENOL #3) 300-30 MG per tablet Take 1 tablet by mouth every 4 (four) hours as needed.  60 tablet  0  . albuterol (PROVENTIL HFA;VENTOLIN HFA) 108 (90 BASE) MCG/ACT inhaler Inhale 2 puffs into the lungs every 6 (six) hours as needed for wheezing.  2 Inhaler  3  . albuterol (PROVENTIL) (5 MG/ML) 0.5% nebulizer solution Take 0.5 mLs (2.5 mg total) by nebulization every 6 (six) hours as needed for wheezing.  20 mL  11  . budesonide-formoterol (SYMBICORT) 160-4.5 MCG/ACT inhaler Inhale 2 puffs into the lungs 2 (two) times daily.  2 Inhaler  2  . cyclobenzaprine (FLEXERIL) 5 MG tablet Take 1 tablet (5 mg total) by mouth 3 (three) times daily as needed for muscle spasms.  30 tablet  0  . dapsone 100 MG tablet Take 1 tablet (100 mg total) by mouth daily.  30 tablet  3  . darunavir-cobicistat (PREZCOBIX) 800-150 MG per tablet Take 1 tablet by mouth daily. Swallow whole. Do NOT crush, break or chew tablets. Take with food.      . dolutegravir (TIVICAY) 50 MG tablet Take 1 tablet (50 mg total) by mouth daily.  30 tablet  11  . emtricitabine-tenofovir (TRUVADA) 200-300 MG per tablet Take 1 tablet by mouth daily.  30 tablet  3  . guaiFENesin (MUCINEX) 600 MG 12 hr tablet Take 2 tablets (1,200 mg total) by mouth 2 (two) times daily.  10 tablet  0  . insulin  aspart (NOVOLOG) 100 UNIT/ML injection Inject 0-20 Units into the skin 3 (three) times daily with meals. CBG 70 - 120: 0 units CBG 121 - 150: 3 units CBG 151 - 200: 4 units CBG 201 - 250: 7 units CBG 251 - 300: 11 units CBG 301 - 350: 15 units CBG 351 - 400: 20 units  3 vial  3  . insulin glargine (LANTUS) 100 UNIT/ML injection Inject 0.1 mLs (10 Units total) into the skin at bedtime.  10 mL  11  . ketorolac (TORADOL) 10 MG tablet Take 1 tablet (10 mg total) by mouth every 6 (six) hours as needed. For back pain  30 tablet  0  . levofloxacin (LEVAQUIN) 750 MG tablet Take 1 tablet (750 mg total) by mouth daily.  5 tablet  0  . lisinopril (PRINIVIL,ZESTRIL) 5 MG tablet Take 1 tablet (5 mg total) by mouth daily.  30 tablet  0  . predniSONE (STERAPRED UNI-PAK) 10 MG tablet Take by mouth daily. Prednisone dosing: Take  Prednisone 60mg  (6 tabs) x 3 days, then taper to 50mg  (5 tabs) x 3 days, then 40mg  (4 tabs) x 3days, then 30mg  (3 tabs) x 3 days, then 20mg  (2 tabs) x 3 days, then 10mg  (1 tab) x 3days, then OFF.  63 tablet  0  . tiotropium (SPIRIVA) 18  MCG inhalation capsule Place 1 capsule (18 mcg total) into inhaler and inhale daily.  30 capsule  2  . TRUVADA 200-300 MG per tablet TAKE 1 TABLET BY MOUTH EVERY DAY  30 tablet  0   No current facility-administered medications on file prior to visit.   Active Ambulatory Problems    Diagnosis Date Noted  . HIV disease 12/06/2012  . COPD/ still smoking  01/11/2013  . Nicotine abuse 01/11/2013  . Steroid-induced hyperglycemia 01/12/2013  . Chronic respiratory failure 01/12/2013  . DM (diabetes mellitus) 01/27/2013  . Smoker 02/17/2013  . Hyperglycemia 02/17/2013  . Uncontrolled diabetes mellitus 02/17/2013  . COPD exacerbation 08/23/2013  . Cyst (solitary) of breast 08/23/2013  . Breast abscess 08/23/2013  . OSA (obstructive sleep apnea) 08/23/2013  . Dyspnea 12/12/2013  . Acute on chronic respiratory failure 12/12/2013   Resolved Ambulatory  Problems    Diagnosis Date Noted  . No Resolved Ambulatory Problems   Past Medical History  Diagnosis Date  . COPD (chronic obstructive pulmonary disease)   . Emphysema   . Diabetes mellitus without complication         Review of Systems 10 point ros is negative except for itching on face and chest    Objective:   Physical Exam BP 118/79  Pulse 101  Temp(Src) 98.7 F (37.1 C) (Oral)  Wt 274 lb (124.286 kg)  SpO2 92% Physical Exam  Constitutional: He is oriented to person, place, and time. He appears well-developed and well-nourished. No distress. Wearing oxygen HENT:  Mouth/Throat: Oropharynx is clear and moist. No oropharyngeal exudate.  Cardiovascular: Normal rate, regular rhythm and normal heart sounds. Exam reveals no gallop and no friction rub.  No murmur heard.  Pulmonary/Chest: Effort normal and breath sounds normal. No respiratory distress. He has no wheezes.  Abdominal: Soft. Bowel sounds are normal. He exhibits no distension. There is no tenderness.  Lymphadenopathy:  He has no cervical adenopathy.  Neurological: He is alert and oriented to person, place, and time.  Skin: Skin is warm and dry. No rash noted. No erythema. Subtle raised small papules to forehead Psychiatric: He has a normal mood and affect. His behavior is normal.        Assessment & Plan:  hiv =well controlled, continue with truvada/prezcobix/DLG   oi proph = continue iwht dapsone since cd 4 count is 180(6%). Will represcribe dapsone   Trichomonas = will treat metronidazole 500mg  BID x 7 days  Hyperlipidemia = will try diet modification trial before initiation of meds  Pruritic rash = hydrocortisone cream as needed 1% to face

## 2014-02-28 ENCOUNTER — Ambulatory Visit (INDEPENDENT_AMBULATORY_CARE_PROVIDER_SITE_OTHER): Payer: Medicaid Other | Admitting: Internal Medicine

## 2014-02-28 ENCOUNTER — Encounter: Payer: Self-pay | Admitting: Internal Medicine

## 2014-02-28 ENCOUNTER — Ambulatory Visit (INDEPENDENT_AMBULATORY_CARE_PROVIDER_SITE_OTHER)
Admission: RE | Admit: 2014-02-28 | Discharge: 2014-02-28 | Disposition: A | Discharge status: Home / Self Care | Payer: Medicaid Other | Source: Ambulatory Visit | Attending: Internal Medicine | Admitting: Internal Medicine

## 2014-02-28 VITALS — BP 94/62 | HR 104 | Temp 99.0°F

## 2014-02-28 DIAGNOSIS — J449 Chronic obstructive pulmonary disease, unspecified: Secondary | ICD-10-CM | POA: Diagnosis not present

## 2014-02-28 DIAGNOSIS — F17200 Nicotine dependence, unspecified, uncomplicated: Secondary | ICD-10-CM | POA: Diagnosis not present

## 2014-02-28 DIAGNOSIS — J4489 Other specified chronic obstructive pulmonary disease: Secondary | ICD-10-CM | POA: Diagnosis not present

## 2014-02-28 MED ORDER — PREDNISONE 10 MG PO TABS: ORAL_TABLET | ORAL | Status: DC

## 2014-02-28 MED ORDER — TIOTROPIUM BROMIDE MONOHYDRATE 2.5 MCG/ACT IN AERS
INHALATION_SPRAY | RESPIRATORY_TRACT | Status: DC
Start: 2014-02-28 — End: 2014-03-14

## 2014-02-28 NOTE — Patient Instructions (Addendum)
Change spiriva to 2 puffs each am instead of the powder   Please remember to go to the  x-ray department downstairs for your tests - we will call you with the results when they are available.  Prednisone 10 mg take  4 each am x 2 days,   2 each am x 2 days,  1 each am x 2 days and stop     GERD (REFLUX)  is an extremely common cause of respiratory symptoms, many times with no significant heartburn at all.    It can be treated with medication, but also with lifestyle changes including avoidance of late meals, excessive alcohol, smoking cessation, and avoid fatty foods, chocolate, peppermint, colas, red wine, and acidic juices such as orange juice.  NO MINT OR MENTHOL PRODUCTS SO NO COUGH DROPS  USE SUGARLESS CANDY INSTEAD (jolley ranchers or Stover's)  NO OIL BASED VITAMINS - use powdered substitutes.    Please schedule a follow up office visit in 2 weeks, sooner if needed to SEE TAMMY with all meds in hand  Add Has exactly 15 days samples of symb/spiriva

## 2014-02-28 NOTE — Progress Notes (Signed)
Subjective:    Patient ID: Chris [name], male    DOB: 1965/10/28   MRN: [id number]    Brief patient profile:  25 yobm smoker with HIV  and doe x 2009 much worse since March 2014 so referred by Chris [name] to pulmonary clinic 02/15/2013 with GOLD III copd by pfts 04/28/13.    HPI 02/15/2013 1st pulmonary eval  Cc indolent onset progressive doe x walking slow pace more than 100 ft,  not at rest., 02 dep, doe much worse x 3 months assoc with congested cough esp in am with sev tbsp thick white mucus takes about 30 min to clear.  No better on inhalers tried to date including advair and spiriva - albuterol works the best and using lots of saba and nebs rec Stop advair and spiriva Plan A = Automatic = Start symbicort 160 Take 2 puffs first thing in am and then another 2 puffs about 12 hours later.  Plan B = backup= Only use your albuterol (as a rescue medication to be used if you can't catch your breath by resting or doing a relaxed purse lip breathing pattern. The less you use it, the better it will work when you need it. Ok to use up to 2 puffs every 4 hours Plan C = nebulizer, use this only if plan B doesn't work ok to use up to 4 hours  03/15/2013 f/u ov/Chris [name] still smoking  Chief Complaint  Patient presents with  . 4 wk follow up    Breathing worse at times.  Wheezing, chest tightness at times, and cough with yellow to clear mucus at times also reported.  Fatigues easily.   has not followed the previous instructions and symptoms have not really changed  rec Plan A = Automatic = Start symbicort 160 Take 2 puffs first thing in am and then another 2 puffs about 12 hours later.  Plan B = backup= Only use your albuterol (proiare) as a rescue medication to be used if you can't catch your breath by resting or doing a relaxed purse lip breathing pattern. The less you use it, the better it will work when you need it. Ok to use up to 2 puffs every 4 hours Plan C = nebulizer, use this only if plan B doesn't work  after 15 min ok to use up to 4 hours  The key is to stop smoking completely before smoking completely stops you!    04/28/2013 f/u ov/Chris [name] still smoking  re:  GOLD III/ 02 dep @ 2lpm  Chief Complaint  Patient presents with  . Follow-up    Pt states that his breathing has improved, symbicort seems to be helping some. No new co's today. Using proair approx 4 times per wk.   still doe but has learned to pace himself and overall better but somewhat limited from desired activities even on his best days rec Plan A = Automatic =  symbicort 160 Take 2 puffs first thing in am and then another 2 puffs about 12 hours later.  Add spiriva one capsule daily each am Plan B = backup= Only use your albuterol (proiare) as a rescue medication to be used if you can't catch your breath by resting or doing a relaxed purse lip breathing pattern. The less you use it, the better it will work when you need it. Ok to use up to 2 puffs every 4 hours Plan C = nebulizer, use this only if plan B doesn't work after 15 min ok to  use up to 4 hours   02/28/2014 f/u ov/Chris [name] re: copd GOLD III/ smoking only 2x daily / 02 2lpm 24/7/ symbicort / spiriva Chief Complaint  Patient presents with  . Follow-up    Pt states "sometimes my breathing is worse". He states that humidity and heat make breathing seem worse. He c/o prod cough with clear to white sputum.    was on acei, stopped ? One m prior to OV  - cough is day > noct, using saba hfa by around 11 am daily despite compliance with symb/spriva (can't confirm) - says virus is not detectable    No obvious day to day or daytime variabilty or assoc   cp or chest tightness, subjective wheeze overt sinus or hb symptoms. No unusual exp hx or h/o childhood pna/ asthma or knowledge of premature birth.  Sleeping ok without nocturnal  or early am exacerbation  of respiratory  c/o's or need for noct saba. Also denies any obvious fluctuation of symptoms with weather or environmental changes or  other aggravating or alleviating factors except as outlined above   Current Medications, Allergies, Complete Past Medical History, Past Surgical History, Family History, and Social History were reviewed in [facility] electronic medical record.  ROS  The following are not active complaints unless bolded sore throat, dysphagia, dental problems, itching, sneezing,  nasal congestion or excess/ purulent secretions, ear ache,   fever, chills, sweats, unintended wt loss, pleuritic or exertional cp, hemoptysis,  orthopnea pnd or leg swelling, presyncope, palpitations, heartburn, abdominal pain, anorexia, nausea, vomiting, diarrhea  or change in bowel or urinary habits, change in stools or urine, dysuria,hematuria,  rash, arthralgias, visual complaints, headache, numbness weakness or ataxia or problems with walking or coordination,  change in mood/affect or memory.             Objective:   Physical Exam   amb bm nad    Wt Readings from Last 3 Encounters:  04/28/13 250 lb 9.6 oz (113.671 kg)  04/20/13 248 lb (112.492 kg)  04/10/13 240 lb (108.863 kg)      HEENT mild turbinate edema.  Oropharynx no thrush or excess pnd or cobblestoning.  No JVD or cervical adenopathy. Mild accessory muscle hypertrophy. Trachea midline, nl thryroid. Chest was hyperinflated by percussion with diminished breath sounds and moderate increased exp time without wheeze. Hoover sign positive at mid inspiration. Regular rate and rhythm without murmur gallop or rub or increase P2 or edema.  Abd: no hsm, nl excursion. Ext warm without cyanosis or clubbing.     cxr  02/28/14 Mildly lower lung volumes. Stable cardiomegaly and mediastinal  contours. Visualized tracheal air column is within normal limits. No  pneumothorax or pulmonary edema. No pleural effusion or acute  pulmonary opacity. Mild chronic infrahilar atelectasis       Assessment & Plan:

## 2014-03-01 NOTE — Assessment & Plan Note (Addendum)
-   04/28/2013 PFT's FEV1 1.21 (39%) ratio 59 and 13% better p B2 and dlco 72 corrects to 102  - started spiriva 04/30/2013 > changed to respimat 02/28/2014  Worse control x weeks initially while on ACEi. DDX of  difficult airways management all start with A and  include Adherence, Ace Inhibitors, Acid Reflux, Active Sinus Disease, Alpha 1 Antitripsin deficiency, Anxiety masquerading as Airways dz,  ABPA,  allergy(esp in young), Aspiration (esp in elderly), Adverse effects of DPI,  Active smokers, plus two Bs  = Bronchiectasis and Beta blocker use..and one C= CHF  Adherence is always the initial "prime suspect" and is a multilayered concern that requires a "trust but verify" approach in every patient - starting with knowing how to use medications, especially inhalers, correctly, keeping up with refills and understanding the fundamental difference between maintenance and prns vs those medications only taken for a very short course and then stopped and not refilled.  - need a trust but verify approach, turned out he was out of spiriva dpi and didn't let me know until the end of the ov - The proper method of use, as well as anticipated side effects, of a metered-dose inhaler are discussed and demonstrated to the patient. Improved effectiveness after extensive coaching during this visit to a level of approximately  90% so change to spiriva respimat - gave 2 week samples of both symb/spiriva and will count on return in 2 weeks  ACEi > permanently avoid here  ? Acid (or non-acid) GERD > always difficult to exclude as up to 75% of pts in some series report no assoc GI/ Heartburn symptoms> rec max (24h)  acid suppression and diet restrictions/ reviewed and instructions given in writing.   Active smoking > discussed separately   ? Allergy > Prednisone 10 mg take  4 each am x 2 days,   2 each am x 2 days,  1 each am x 2 days and stop   ? Adverse effects of dpi > does so well with hfa that hfa prob better choice  given upper airway concerns that might have come to surface on acei and still lingering with hoarseness and dry cough > change to respimat spiriva    Each maintenance medication was reviewed in detail including most importantly the difference between maintenance and as needed and under what circumstances the prns are to be used.  Please see instructions for details which were reviewed in writing and the patient given a copy.

## 2014-03-01 NOTE — Assessment & Plan Note (Signed)
>   3 m  I took an extended  opportunity with this patient to outline the consequences of continued cigarette use  in airway disorders based on all the data we have from the multiple national lung health studies (perfomed over decades at millions of dollars in cost)  indicating that smoking cessation, not choice of inhalers or physicians, is the most important aspect of care.   

## 2014-03-01 NOTE — Progress Notes (Signed)
Quick Note:  Spoke with pt and notified of results per Dr. [name]. Pt verbalized understanding and denied any questions.  ______ 
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
## 2014-03-03 IMAGING — CR DG CHEST 2V
2 series · 2 of 2 positions shown · non-contrast
Comparison: 01/28/2013

CLINICAL DATA: Shortness of breath

EXAM:
CHEST  2 VIEW

[w chest pa]
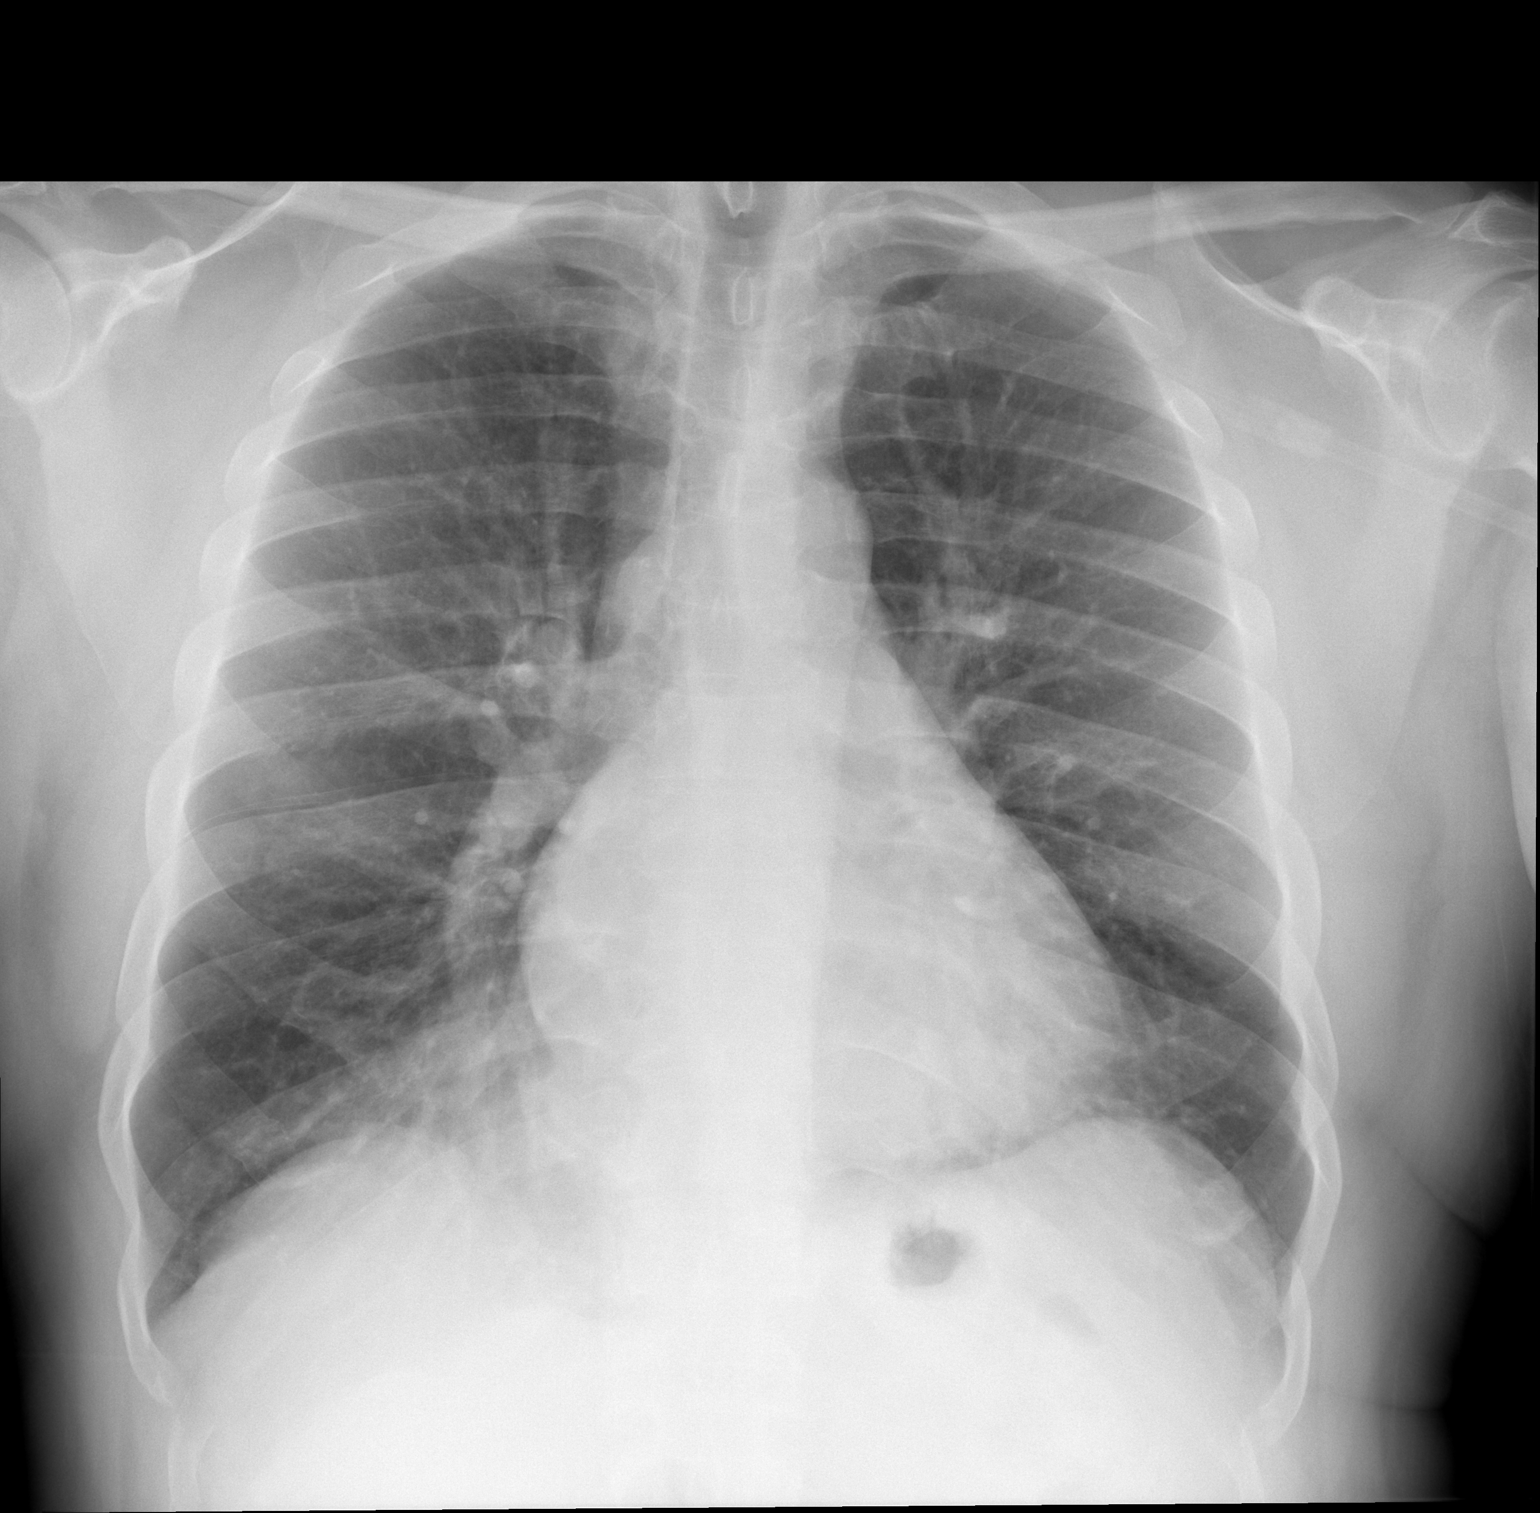

[w chest lat]
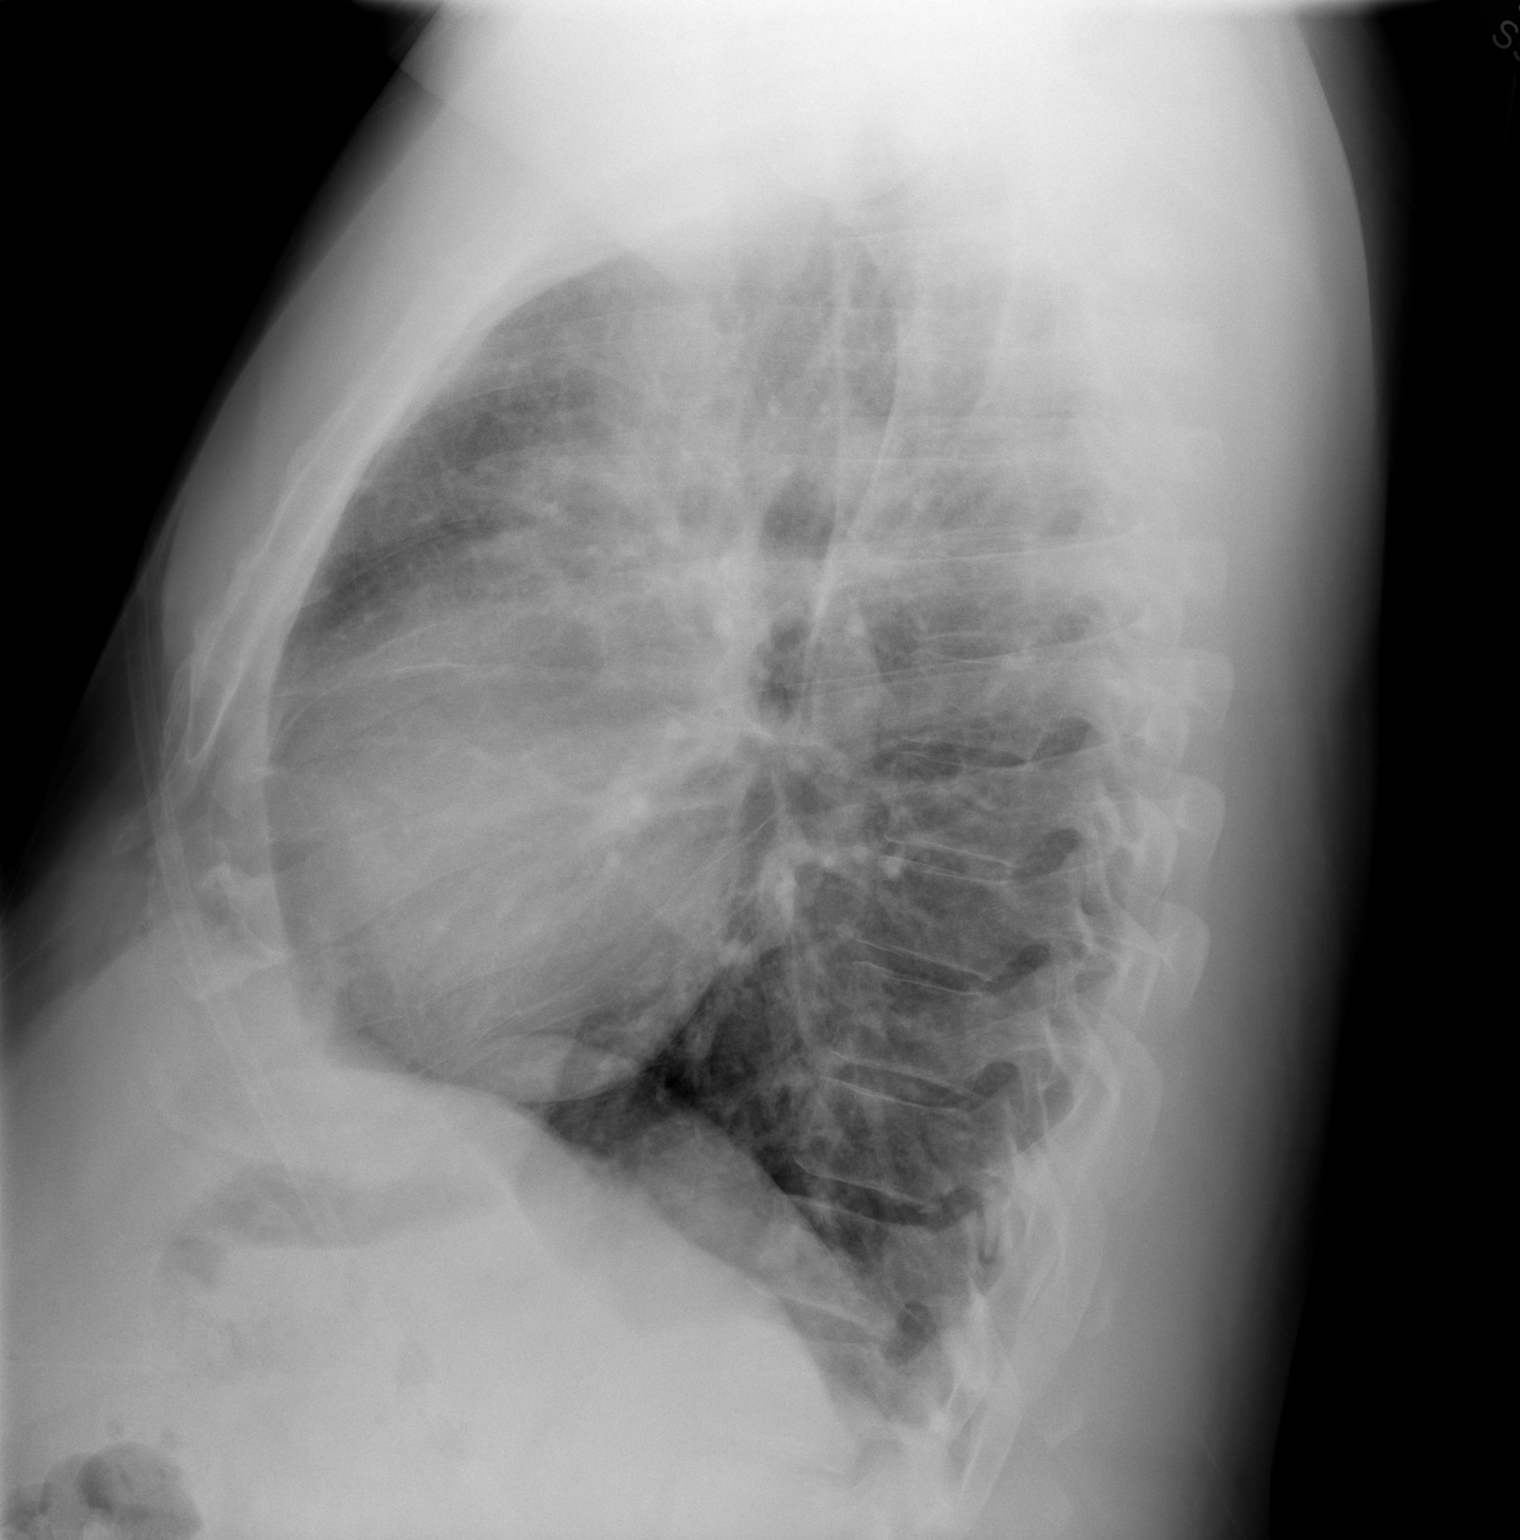

[2 of 2 positions shown; findings below may reference images not displayed]

FINDINGS: Cardiomediastinal silhouette is stable. Mild hyperinflation again
noted. Stable central mild bronchitic changes. No acute infiltrate
or pulmonary edema.
IMPRESSION: No active disease. Mild hyperinflation again noted. Stable central
mild bronchitic changes.
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
## 2014-03-14 ENCOUNTER — Ambulatory Visit (INDEPENDENT_AMBULATORY_CARE_PROVIDER_SITE_OTHER): Payer: Medicaid Other | Admitting: Adult Health

## 2014-03-14 ENCOUNTER — Encounter: Payer: Self-pay | Admitting: Adult Health

## 2014-03-14 VITALS — BP 124/74 | HR 91 | Temp 97.1°F | Ht 69.0 in | Wt 274.2 lb

## 2014-03-14 DIAGNOSIS — G4733 Obstructive sleep apnea (adult) (pediatric): Secondary | ICD-10-CM

## 2014-03-14 DIAGNOSIS — J441 Chronic obstructive pulmonary disease with (acute) exacerbation: Secondary | ICD-10-CM

## 2014-03-14 DIAGNOSIS — J449 Chronic obstructive pulmonary disease, unspecified: Secondary | ICD-10-CM

## 2014-03-14 MED ORDER — BUDESONIDE-FORMOTEROL FUMARATE 160-4.5 MCG/ACT IN AERO: 2.0000 | INHALATION_SPRAY | Freq: Two times a day (BID) | RESPIRATORY_TRACT | Status: DC

## 2014-03-14 MED ORDER — TIOTROPIUM BROMIDE MONOHYDRATE 2.5 MCG/ACT IN AERS: INHALATION_SPRAY | RESPIRATORY_TRACT | Status: DC

## 2014-03-14 NOTE — Patient Instructions (Signed)
Follow med calendar closely and bring to each visit.  MUST QUIT SMOKING -that is your number one goal .  We will set you up for sleep study.  Follow up Dr. Sherene SiresWert  In 2 months and As needed

## 2014-03-14 NOTE — Progress Notes (Signed)
Chart reviewed, ov note reviewed, agree with a/p

## 2014-03-14 NOTE — Progress Notes (Signed)
Subjective:    Patient ID: Chris [name] [name], male    DOB: 10/31/1965   MRN: [id number]    Brief patient profile:  [age] yobm smoker with HIV  and doe x 2009 much worse since March 2014 so referred by Manson PasseyAlma Devine to pulmonary clinic 02/15/2013 with GOLD III copd by pfts 04/28/13.  HPI 02/15/2013 1st pulmonary eval  Cc indolent onset progressive doe x walking slow pace more than 100 ft,  not at rest., 02 dep, doe much worse x 3 months assoc with congested cough esp in am with sev tbsp thick white mucus takes about 30 min to clear.  No better on inhalers tried to date including advair and spiriva - albuterol works the best and using lots of saba and nebs rec Stop advair and spiriva Plan A = Automatic = Start symbicort 160 Take 2 puffs first thing in am and then another 2 puffs about 12 hours later.  Plan B = backup= Only use your albuterol (as a rescue medication to be used if you can't catch your breath by resting or doing a relaxed purse lip breathing pattern. The less you use it, the better it will work when you need it. Ok to use up to 2 puffs every 4 hours Plan C = nebulizer, use this only if plan B doesn't work ok to use up to 4 hours  03/15/2013 f/u ov/[name] still smoking  Chief Complaint  Patient presents with  . 4 wk follow up    Breathing worse at times.  Wheezing, chest tightness at times, and cough with yellow to clear mucus at times also reported.  Fatigues easily.   has not followed the previous instructions and symptoms have not really changed  rec Plan A = Automatic = Start symbicort 160 Take 2 puffs first thing in am and then another 2 puffs about 12 hours later.  Plan B = backup= Only use your albuterol (proiare) as a rescue medication to be used if you can't catch your breath by resting or doing a relaxed purse lip breathing pattern. The less you use it, the better it will work when you need it. Ok to use up to 2 puffs every 4 hours Plan C = nebulizer, use this only if plan B doesn't work  after 15 min ok to use up to 4 hours  The key is to stop smoking completely before smoking completely stops you!    04/28/2013 f/u ov/[name] still smoking  re:  GOLD III/ 02 dep @ 2lpm  Chief Complaint  Patient presents with  . Follow-up    Pt states that his breathing has improved, symbicort seems to be helping some. No new co's today. Using proair approx 4 times per wk.   still doe but has learned to pace himself and overall better but somewhat limited from desired activities even on his best days rec Plan A = Automatic =  symbicort 160 Take 2 puffs first thing in am and then another 2 puffs about 12 hours later.  Add spiriva one capsule daily each am Plan B = backup= Only use your albuterol (proiare) as a rescue medication to be used if you can't catch your breath by resting or doing a relaxed purse lip breathing pattern. The less you use it, the better it will work when you need it. Ok to use up to 2 puffs every 4 hours Plan C = nebulizer, use this only if plan B doesn't work after 15 min ok to use up  to 4 hours   02/28/2014 f/u ov/[name] re: copd GOLD III/ smoking only 2x daily / 02 2lpm 24/7/ symbicort / spiriva Chief Complaint  Patient presents with  . Follow-up    Pt states "sometimes my breathing is worse". He states that humidity and heat make breathing seem worse. He c/o prod cough with clear to white sputum.    was on acei, stopped ? One m prior to OV  - cough is day > noct, using saba hfa by around 11 am daily despite compliance with symb/spriva (can't confirm) - says virus is not detectable  >>  03/14/2014 Follow up and Medication review  COPD GOLD III/ smoking only 2x daily / 02 2lpm 24/7/ symbicort  Patient returns for a two-week followup and medication review. We reviewed all his medications. Organized them into a medication calendar with patient education. Patient left a few of his medications at home.  Feels that his breathing has improved since on Spiriva.  Patient has cut  back on smoking. We discussed smoking cessation. Pt has had frequent COPD flare with steroids used x 2 over last 2 months. We discussed sided effects and risk/benefit of frequent steroid use especially along with his HIV meds and ICS inhalers.He verbalized understanding.   Pt does complain of daytime sleepiness, snoring. Has been told he has sleep apnea in past but has not had formal sleep study.     Current Medications, Allergies, Complete Past Medical History, Past Surgical History, Family History, and Social History were reviewed in [facility] record.  ROS  The following are not active complaints unless bolded sore throat, dysphagia, dental problems, itching, sneezing,  nasal congestion or excess/ purulent secretions, ear ache,   fever, chills, sweats, unintended wt loss, pleuritic or exertional cp, hemoptysis,  orthopnea pnd or leg swelling, presyncope, palpitations, heartburn, abdominal pain, anorexia, nausea, vomiting, diarrhea  or change in bowel or urinary habits, change in stools or urine, dysuria,hematuria,  rash, arthralgias, visual complaints, headache, numbness weakness or ataxia or problems with walking or coordination,  change in mood/affect or memory.             Objective:   Physical Exam   amb bm nad   HEENT mild turbinate edema.  Oropharynx no thrush or excess pnd or cobblestoning.  No JVD or cervical adenopathy. Mild accessory muscle hypertrophy. Trachea midline, nl thryroid. Chest was hyperinflated by percussion with diminished breath sounds and moderate increased exp time without wheeze. Hoover sign positive at mid inspiration. Regular rate and rhythm without murmur gallop or rub or increase P2 or edema.  Abd: no hsm, nl excursion. Ext warm without cyanosis or clubbing.     cxr  02/28/14 Mildly lower lung volumes. Stable cardiomegaly and mediastinal  contours. Visualized tracheal air column is within normal limits. No  pneumothorax or  pulmonary edema. No pleural effusion or acute  pulmonary opacity. Mild chronic infrahilar atelectasis       Assessment & Plan:

## 2014-03-14 NOTE — Assessment & Plan Note (Signed)
?  OSA w/ daytime sleepiness, obesity, snoring   Plan  Check split night sleep study.

## 2014-03-14 NOTE — Assessment & Plan Note (Signed)
Severe COPD w/ asthmatic component in pt w/ ongoing smoking with tendency toward frequent exacerbations  Discussed frequent steroid use. Advised on side effect of steroids esp with HIV meds and ICS , pt verbalizes understanding of risk /benefit For now cont on current regimen , work on smoking cessation.  Patient's medications were reviewed today and patient education was given. Computerized medication calendar was adjusted/completed   Plan  Follow med calendar closely and bring to each visit.  MUST QUIT SMOKING -that is your number one goal .  Follow up Dr. Sherene SiresWert  In 2 months and As needed

## 2014-03-16 ENCOUNTER — Other Ambulatory Visit: Payer: Self-pay | Admitting: Internal Medicine

## 2014-03-20 ENCOUNTER — Telehealth: Payer: Self-pay | Admitting: Adult Health

## 2014-03-20 ENCOUNTER — Encounter (HOSPITAL_COMMUNITY): Payer: Self-pay | Admitting: Emergency Medicine

## 2014-03-20 ENCOUNTER — Emergency Department (HOSPITAL_COMMUNITY)
Admission: EM | Admit: 2014-03-20 | Discharge: 2014-03-21 | Disposition: A | Discharge status: Home / Self Care | Payer: Medicaid Other | Attending: Emergency Medicine | Admitting: Emergency Medicine

## 2014-03-20 DIAGNOSIS — Z21 Asymptomatic human immunodeficiency virus [HIV] infection status: Secondary | ICD-10-CM | POA: Diagnosis not present

## 2014-03-20 DIAGNOSIS — J441 Chronic obstructive pulmonary disease with (acute) exacerbation: Secondary | ICD-10-CM | POA: Insufficient documentation

## 2014-03-20 DIAGNOSIS — IMO0002 Reserved for concepts with insufficient information to code with codable children: Secondary | ICD-10-CM | POA: Diagnosis not present

## 2014-03-20 DIAGNOSIS — Z794 Long term (current) use of insulin: Secondary | ICD-10-CM | POA: Insufficient documentation

## 2014-03-20 DIAGNOSIS — M545 Low back pain, unspecified: Secondary | ICD-10-CM

## 2014-03-20 DIAGNOSIS — F17200 Nicotine dependence, unspecified, uncomplicated: Secondary | ICD-10-CM | POA: Insufficient documentation

## 2014-03-20 DIAGNOSIS — G8929 Other chronic pain: Secondary | ICD-10-CM | POA: Insufficient documentation

## 2014-03-20 DIAGNOSIS — Z79899 Other long term (current) drug therapy: Secondary | ICD-10-CM | POA: Insufficient documentation

## 2014-03-20 DIAGNOSIS — E119 Type 2 diabetes mellitus without complications: Secondary | ICD-10-CM | POA: Diagnosis not present

## 2014-03-20 DIAGNOSIS — B20 Human immunodeficiency virus [HIV] disease: Secondary | ICD-10-CM

## 2014-03-20 DIAGNOSIS — J449 Chronic obstructive pulmonary disease, unspecified: Secondary | ICD-10-CM

## 2014-03-20 DIAGNOSIS — Z792 Long term (current) use of antibiotics: Secondary | ICD-10-CM | POA: Insufficient documentation

## 2014-03-20 DIAGNOSIS — M8560 Other cyst of bone, unspecified site: Secondary | ICD-10-CM | POA: Diagnosis present

## 2014-03-20 DIAGNOSIS — L03012 Cellulitis of left finger: Secondary | ICD-10-CM
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
## 2014-03-20 NOTE — ED Notes (Signed)
Presents with edema to left pinky finger, denies dranage, edema around nailbed. Also reports small cyst to lower abdomen, no drainage. C/o chronic back ongoing for "a long time. I was on tylenol 3 but I ran out a week ago"

## 2014-03-20 NOTE — Telephone Encounter (Signed)
Called and spoke with the pt and he stated that his insurance will cover the symbicort but not the spiriva respimat.  Pt is requesting further recs for this medication.  Thanks  Allergies  Allergen Reactions  . Bactrim [Sulfamethoxazole-Tmp Ds] Hives    Current Outpatient Prescriptions on File Prior to Visit  Medication Sig Dispense Refill  . acetaminophen-codeine (TYLENOL #3) 300-30 MG per tablet Take 1 tablet by mouth every 4 (four) hours as needed.  60 tablet  0  . albuterol (PROVENTIL HFA;VENTOLIN HFA) 108 (90 BASE) MCG/ACT inhaler Inhale 2 puffs into the lungs every 6 (six) hours as needed for wheezing.  2 Inhaler  3  . albuterol (PROVENTIL) (5 MG/ML) 0.5% nebulizer solution Take 0.5 mLs (2.5 mg total) by nebulization every 6 (six) hours as needed for wheezing.  20 mL  11  . budesonide-formoterol (SYMBICORT) 160-4.5 MCG/ACT inhaler Inhale 2 puffs into the lungs 2 (two) times daily.  10.2 g  11  . cyclobenzaprine (FLEXERIL) 5 MG tablet Take 1 tablet (5 mg total) by mouth 3 (three) times daily as needed for muscle spasms.  30 tablet  0  . dapsone 100 MG tablet Take 1 tablet (100 mg total) by mouth daily.  30 tablet  11  . darunavir-cobicistat (PREZCOBIX) 800-150 MG per tablet Take 1 tablet by mouth daily. Swallow whole. Do NOT crush, break or chew tablets. Take with food.      . dolutegravir (TIVICAY) 50 MG tablet Take 1 tablet (50 mg total) by mouth daily.  30 tablet  11  . emtricitabine-tenofovir (TRUVADA) 200-300 MG per tablet Take 1 tablet by mouth daily.  30 tablet  3  . guaiFENesin (MUCINEX) 600 MG 12 hr tablet Take 2 tablets (1,200 mg total) by mouth 2 (two) times daily.  10 tablet  0  . insulin aspart (NOVOLOG) 100 UNIT/ML injection Inject 0-20 Units into the skin 3 (three) times daily with meals. CBG 70 - 120: 0 units CBG 121 - 150: 3 units CBG 151 - 200: 4 units CBG 201 - 250: 7 units CBG 251 - 300: 11 units CBG 301 - 350: 15 units CBG 351 - 400: 20 units  3 vial  3  . insulin  glargine (LANTUS) 100 UNIT/ML injection Inject 0.1 mLs (10 Units total) into the skin at bedtime.  10 mL  11  . levofloxacin (LEVAQUIN) 750 MG tablet Take 1 tablet (750 mg total) by mouth daily.  5 tablet  0  . predniSONE (DELTASONE) 10 MG tablet Take  4 each am x 2 days,   2 each am x 2 days,  1 each am x 2 days and stop  14 tablet  0  . Tiotropium Bromide Monohydrate (SPIRIVA RESPIMAT) 2.5 MCG/ACT AERS 2 puffs each am  4 g  11  . TRUVADA 200-300 MG per tablet TAKE 1 TABLET BY MOUTH DAILY  30 tablet  0   No current facility-administered medications on file prior to visit.

## 2014-03-20 NOTE — Telephone Encounter (Signed)
Ok to give sample until next ov and come in with formulary or set up appt with Tammy for this but there should be plenty of options that will do the same thing

## 2014-03-21 MED ORDER — KETOROLAC TROMETHAMINE 30 MG/ML IJ SOLN: 30.0000 mg | Freq: Once | INTRAMUSCULAR | Status: DC

## 2014-03-21 MED ORDER — ACETAMINOPHEN-CODEINE #3 300-30 MG PO TABS: 1.0000 | ORAL_TABLET | Freq: Four times a day (QID) | ORAL | Status: DC | PRN

## 2014-03-21 MED ORDER — CLINDAMYCIN HCL 150 MG PO CAPS: 150.0000 mg | ORAL_CAPSULE | Freq: Three times a day (TID) | ORAL | Status: DC

## 2014-03-21 NOTE — Telephone Encounter (Signed)
Called spoke with patient and discussed MW's recs with him.  Pt stated he has Medicaid and therefore does not have a formulary; he stated he is also unable to come for appt at this time.  Advised pt will call [employer] for him to look for covered alternatives.  Called [employer] and spoke with representative Narmine.  Per Narmine regular Spiriva is covered and must fail this in order for them to cover the Spiriva Respimat.  It appears that pt has already tried and failed the regular spiriva.  Dr Sherene SiresWert, would you like for us to initiate a PA on the Respimat or push again for pt to come in for ov to discuss?  Please advise, thank you.  Per the 9.18.14 ov w/ MW: COPD  GOLD III, still smoking - Nyoka CowdenMichael B [name], MD at 04/30/2013 11:12 AM      Status: Linus OrnEdited Related Problem: COPD/ still smoking     - hfa 75%  02/15/13 > 90% 03/15/2013   - 04/28/2013 PFT's FEV1 1.21 (39%) ratio 59 and 13% better p B2 and dlco 72 corrects to 102   DDX of  difficult airways managment all start with A and  include Adherence, Ace Inhibitors, Acid Reflux, Active Sinus Disease, Alpha 1 Antitripsin deficiency, Anxiety masquerading as Airways dz,  ABPA,  allergy(esp in young), Aspiration (esp in elderly), Adverse effects of DPI,  Active smokers, plus two Bs  = Bronchiectasis and Beta blocker use..and one C= CHF   Active smoking is the only remaining issue.  I reviewed the Flethcher curve with patient that basically indicates  if you quit smoking when your best day FEV1 is still well preserved it is highly unlikely you will progress to severe disease and informed the patient there was no medication on the market that has proven to change the curve or the likelihood of progression.  Therefore stopping smoking and maintaining abstinence is the most important aspect of care, not choice of inhalers or for that matter, doctors.    In meantime, ok to start spiriva trial to see if improves activity tolerance and if not should just use symbicort and  focus on smoking cessation through his primary care provider  Pulmonary f/u can be prn     Per the 7.21.15 ov w/ MW: COPD  GOLD III, still smoking - Nyoka CowdenMichael B [name], MD at 03/01/2014  6:48 AM      Status: Linus OrnEdited Related Problem: COPD/ still smoking     - 04/28/2013 PFT's FEV1 1.21 (39%) ratio 59 and 13% better p B2 and dlco 72 corrects to 102   - started spiriva 04/30/2013 > changed to respimat 02/28/2014  Worse control x weeks initially while on ACEi. DDX of  difficult airways management all start with A and  include Adherence, Ace Inhibitors, Acid Reflux, Active Sinus Disease, Alpha 1 Antitripsin deficiency, Anxiety masquerading as Airways dz,  ABPA,  allergy(esp in young), Aspiration (esp in elderly), Adverse effects of DPI,  Active smokers, plus two Bs  = Bronchiectasis and Beta blocker use..and one C= CHF  Adherence is always the initial "prime suspect" and is a multilayered concern that requires a "trust but verify" approach in every patient - starting with knowing how to use medications, especially inhalers, correctly, keeping up with refills and understanding the fundamental difference between maintenance and prns vs those medications only taken for a very short course and then stopped and not refilled.   - need a trust but verify approach, turned out he was out of  spiriva dpi and didn't let me know until the end of the ov - The proper method of use, as well as anticipated side effects, of a metered-dose inhaler are discussed and demonstrated to the patient. Improved effectiveness after extensive coaching during this visit to a level of approximately  90% so change to spiriva respimat - gave 2 week samples of both symb/spiriva and will count on return in 2 weeks

## 2014-03-21 NOTE — ED Provider Notes (Signed)
Medical screening examination/treatment/procedure(s) were performed by non-physician practitioner and as supervising physician I was immediately available for consultation/collaboration.   Keenen Roessner, MD 03/21/14 0555 

## 2014-03-21 NOTE — Discharge Instructions (Signed)
Paronychia Paronychia is an inflammatory reaction involving the folds of the skin surrounding the fingernail. This is commonly caused by an infection in the skin around a nail. The most common cause of paronychia is frequent wetting of the hands (as seen with bartenders, food servers, nurses or others who wet their hands). This makes the skin around the fingernail susceptible to infection by bacteria (germs) or fungus. Other predisposing factors are:  Aggressive manicuring.  Nail biting.  Thumb sucking. The most common cause is a staphylococcal (a type of germ) infection, or a fungal (Candida) infection. When caused by a germ, it usually comes on suddenly with redness, swelling, pus and is often painful. It may get under the nail and form an abscess (collection of pus), or form an abscess around the nail. If the nail itself is infected with a fungus, the treatment is usually prolonged and may require oral medicine for up to one year. Your caregiver will determine the length of time treatment is required. The paronychia caused by bacteria (germs) may largely be avoided by not pulling on hangnails or picking at cuticles. When the infection occurs at the tips of the finger it is called felon. When the cause of paronychia is from the herpes simplex virus (HSV) it is called herpetic whitlow. TREATMENT  When an abscess is present treatment is often incision and drainage. This means that the abscess must be cut open so the pus can get out. When this is done, the following home care instructions should be followed. HOME CARE INSTRUCTIONS   It is important to keep the affected fingers very dry. Rubber or plastic gloves over cotton gloves should be used whenever the hand must be placed in water.  Keep wound clean, dry and dressed as suggested by your caregiver between warm soaks or warm compresses.  Soak in warm water for fifteen to twenty minutes three to four times per day for bacterial infections. Fungal  infections are very difficult to treat, so often require treatment for long periods of time.  For bacterial (germ) infections take antibiotics (medicine which kill germs) as directed and finish the prescription, even if the problem appears to be solved before the medicine is gone.  Only take over-the-counter or prescription medicines for pain, discomfort, or fever as directed by your caregiver. SEEK IMMEDIATE MEDICAL CARE IF:  You have redness, swelling, or increasing pain in the wound.  You notice pus coming from the wound.  You have a fever.  You notice a bad smell coming from the wound or dressing. Document Released: 01/21/2001 Document Revised: 10/20/2011 Document Reviewed: 09/22/2008 [facility] Patient Information 2015 [place], [place]. This information is not intended to replace advice given to you by your health care provider. Make sure you discuss any questions you have with your health care provider.  Back Pain, Adult Low back pain is very common. About 1 in 5 people have back pain.The cause of low back pain is rarely dangerous. The pain often gets better over time.About half of people with a sudden onset of back pain feel better in just 2 weeks. About 8 in 10 people feel better by 6 weeks.  CAUSES Some common causes of back pain include:  Strain of the muscles or ligaments supporting the spine.  Wear and tear (degeneration) of the spinal discs.  Arthritis.  Direct injury to the back. DIAGNOSIS Most of the time, the direct cause of low back pain is not known.However, back pain can be treated effectively even when the exact  cause of the pain is unknown.Answering your caregiver's questions about your overall health and symptoms is one of the most accurate ways to make sure the cause of your pain is not dangerous. If your caregiver needs more information, he or she may order lab work or imaging tests (X-rays or MRIs).However, even if imaging tests show changes in your back, this  usually does not require surgery. HOME CARE INSTRUCTIONS For many people, back pain returns.Since low back pain is rarely dangerous, it is often a condition that people can learn to [facility] their own.   Remain active. It is stressful on the back to sit or stand in one place. Do not sit, drive, or stand in one place for more than 30 minutes at a time. Take short walks on level surfaces as soon as pain allows.Try to increase the length of time you walk each day.  Do not stay in bed.Resting more than 1 or 2 days can delay your recovery.  Do not avoid exercise or work.Your body is made to move.It is not dangerous to be active, even though your back may hurt.Your back will likely heal faster if you return to being active before your pain is gone.  Pay attention to your body when you bend and lift. Many people have less discomfortwhen lifting if they bend their knees, keep the load close to their bodies,and avoid twisting. Often, the most comfortable positions are those that put less stress on your recovering back.  Find a comfortable position to sleep. Use a firm mattress and lie on your side with your knees slightly bent. If you lie on your back, put a pillow under your knees.  Only take over-the-counter or prescription medicines as directed by your caregiver. Over-the-counter medicines to reduce pain and inflammation are often the most helpful.Your caregiver may prescribe muscle relaxant drugs.These medicines help dull your pain so you can more quickly return to your normal activities and healthy exercise.  Put ice on the injured area.  Put ice in a plastic bag.  Place a towel between your skin and the bag.  Leave the ice on for 15-20 minutes, 03-04 times a day for the first 2 to 3 days. After that, ice and heat may be alternated to reduce pain and spasms.  Ask your caregiver about trying back exercises and gentle massage. This may be of some benefit.  Avoid feeling anxious or  stressed.Stress increases muscle tension and can worsen back pain.It is important to recognize when you are anxious or stressed and learn ways to manage it.Exercise is a great option. SEEK MEDICAL CARE IF:  You have pain that is not relieved with rest or medicine.  You have pain that does not improve in 1 week.  You have new symptoms.  You are generally not feeling well. SEEK IMMEDIATE MEDICAL CARE IF:   You have pain that radiates from your back into your legs.  You develop new bowel or bladder control problems.  You have unusual weakness or numbness in your arms or legs.  You develop nausea or vomiting.  You develop abdominal pain.  You feel faint. Document Released: 07/28/2005 Document Revised: 01/27/2012 Document Reviewed: 11/29/2013 [facility] Patient Information 2015 [place]. This information is not intended to replace advice given to you by your health care provider. Make sure you discuss any questions you have with your health care provider.

## 2014-03-21 NOTE — ED Notes (Signed)
Declined W/C at D/C and was escorted to lobby by RN. 

## 2014-03-21 NOTE — Telephone Encounter (Signed)
Fine to do pa

## 2014-03-21 NOTE — Telephone Encounter (Signed)
Called [employer] [phone] PA completed for Spiriva Respimat Approved through 03/22/2015 Approval # [id number]  Pt aware.  Spoke with [facility]--aware Spiriva approved Nothing further needed.

## 2014-03-23 NOTE — Addendum Note (Signed)
Addended by: Boone MasterJONES, Pascuala Klutts E on: 03/23/2014 01:58 PM   Modules accepted: Orders

## 2014-04-20 ENCOUNTER — Ambulatory Visit: Payer: Medicaid Other | Attending: Internal Medicine | Admitting: Internal Medicine

## 2014-04-20 ENCOUNTER — Encounter: Payer: Self-pay | Admitting: Internal Medicine

## 2014-04-20 VITALS — BP 133/75 | HR 91 | Temp 98.6°F | Resp 18 | Ht 69.0 in | Wt 282.8 lb

## 2014-04-20 DIAGNOSIS — J441 Chronic obstructive pulmonary disease with (acute) exacerbation: Secondary | ICD-10-CM | POA: Diagnosis not present

## 2014-04-20 DIAGNOSIS — Z23 Encounter for immunization: Secondary | ICD-10-CM | POA: Diagnosis not present

## 2014-04-20 DIAGNOSIS — E119 Type 2 diabetes mellitus without complications: Secondary | ICD-10-CM

## 2014-04-20 DIAGNOSIS — G4733 Obstructive sleep apnea (adult) (pediatric): Secondary | ICD-10-CM | POA: Insufficient documentation

## 2014-04-20 DIAGNOSIS — E669 Obesity, unspecified: Secondary | ICD-10-CM | POA: Diagnosis not present

## 2014-04-20 DIAGNOSIS — M545 Low back pain, unspecified: Secondary | ICD-10-CM | POA: Insufficient documentation

## 2014-04-20 DIAGNOSIS — F17200 Nicotine dependence, unspecified, uncomplicated: Secondary | ICD-10-CM | POA: Insufficient documentation

## 2014-04-20 DIAGNOSIS — G8929 Other chronic pain: Secondary | ICD-10-CM | POA: Insufficient documentation

## 2014-04-20 DIAGNOSIS — Z9981 Dependence on supplemental oxygen: Secondary | ICD-10-CM | POA: Diagnosis not present

## 2014-04-20 DIAGNOSIS — Z794 Long term (current) use of insulin: Secondary | ICD-10-CM | POA: Diagnosis not present

## 2014-04-20 DIAGNOSIS — Z6841 Body Mass Index (BMI) 40.0 and over, adult: Secondary | ICD-10-CM | POA: Diagnosis not present

## 2014-04-20 DIAGNOSIS — E1165 Type 2 diabetes mellitus with hyperglycemia: Secondary | ICD-10-CM

## 2014-04-20 DIAGNOSIS — B20 Human immunodeficiency virus [HIV] disease: Secondary | ICD-10-CM | POA: Insufficient documentation

## 2014-04-20 LAB — GLUCOSE, POCT (MANUAL RESULT ENTRY): POC GLUCOSE: 235 mg/dL — AB (ref 70–99)

## 2014-04-20 LAB — POCT GLYCOSYLATED HEMOGLOBIN (HGB A1C): HEMOGLOBIN A1C: 6.5

## 2014-04-20 MED ORDER — ACETAMINOPHEN-CODEINE #3 300-30 MG PO TABS: 1.0000 | ORAL_TABLET | ORAL | Status: DC | PRN

## 2014-04-20 MED ORDER — DOXYCYCLINE HYCLATE 100 MG PO TABS: 100.0000 mg | ORAL_TABLET | Freq: Two times a day (BID) | ORAL | Status: DC

## 2014-04-20 MED ORDER — GLUCOSE BLOOD VI STRP: ORAL_STRIP | Status: DC

## 2014-04-20 MED ORDER — PREDNISONE 10 MG PO TABS: ORAL_TABLET | ORAL | Status: DC

## 2014-04-20 MED ORDER — ALBUTEROL SULFATE HFA 108 (90 BASE) MCG/ACT IN AERS: 2.0000 | INHALATION_SPRAY | RESPIRATORY_TRACT | Status: DC | PRN

## 2014-04-20 MED ORDER — BUDESONIDE-FORMOTEROL FUMARATE 160-4.5 MCG/ACT IN AERO: 2.0000 | INHALATION_SPRAY | Freq: Two times a day (BID) | RESPIRATORY_TRACT | Status: DC

## 2014-04-20 MED ORDER — TIOTROPIUM BROMIDE MONOHYDRATE 2.5 MCG/ACT IN AERS: INHALATION_SPRAY | RESPIRATORY_TRACT | Status: DC

## 2014-04-20 NOTE — Patient Instructions (Signed)
Diabetes Mellitus and Food It is important for you to manage your blood sugar (glucose) level. Your blood glucose level can be greatly affected by what you eat. Eating healthier foods in the appropriate amounts throughout the day at about the same time each day will help you control your blood glucose level. It can also help slow or prevent worsening of your diabetes mellitus. Healthy eating may even help you improve the level of your blood pressure and reach or maintain a healthy weight.  HOW CAN FOOD AFFECT ME? Carbohydrates Carbohydrates affect your blood glucose level more than any other type of food. Your dietitian will help you determine how many carbohydrates to eat at each meal and teach you how to count carbohydrates. Counting carbohydrates is important to keep your blood glucose at a healthy level, especially if you are using insulin or taking certain medicines for diabetes mellitus. Alcohol Alcohol can cause sudden decreases in blood glucose (hypoglycemia), especially if you use insulin or take certain medicines for diabetes mellitus. Hypoglycemia can be a life-threatening condition. Symptoms of hypoglycemia (sleepiness, dizziness, and disorientation) are similar to symptoms of having too much alcohol.  If your health care provider has given you approval to drink alcohol, do so in moderation and use the following guidelines:  Women should not have more than one drink per day, and men should not have more than two drinks per day. One drink is equal to:  12 oz of beer.  5 oz of wine.  1 oz of hard liquor.  Do not drink on an empty stomach.  Keep yourself hydrated. Have water, diet soda, or unsweetened iced tea.  Regular soda, juice, and other mixers might contain a lot of carbohydrates and should be counted. WHAT FOODS ARE NOT RECOMMENDED? As you make food choices, it is important to remember that all foods are not the same. Some foods have fewer nutrients per serving than other  foods, even though they might have the same number of calories or carbohydrates. It is difficult to get your body what it needs when you eat foods with fewer nutrients. Examples of foods that you should avoid that are high in calories and carbohydrates but low in nutrients include:  Trans fats (most processed foods list trans fats on the Nutrition Facts label).  Regular soda.  Juice.  Candy.  Sweets, such as cake, pie, doughnuts, and cookies.  Fried foods. WHAT FOODS CAN I EAT? Have nutrient-rich foods, which will nourish your body and keep you healthy. The food you should eat also will depend on several factors, including:  The calories you need.  The medicines you take.  Your weight.  Your blood glucose level.  Your blood pressure level.  Your cholesterol level. You also should eat a variety of foods, including:  Protein, such as meat, poultry, fish, tofu, nuts, and seeds (lean animal proteins are best).  Fruits.  Vegetables.  Dairy products, such as milk, cheese, and yogurt (low fat is best).  Breads, grains, pasta, cereal, rice, and beans.  Fats such as olive oil, trans fat-free margarine, canola oil, avocado, and olives. DOES EVERYONE WITH DIABETES MELLITUS HAVE THE SAME MEAL PLAN? Because every person with diabetes mellitus is different, there is not one meal plan that works for everyone. It is very important that you meet with a dietitian who will help you create a meal plan that is just right for you. Document Released: 04/24/2005 Document Revised: 08/02/2013 Document Reviewed: 06/24/2013 Exit[employer] Patient Information 2015 [employer]. This   information is not intended to replace advice given to you by your health care provider. Make sure you discuss any questions you have with your health care provider. Diabetes and Exercise Exercising regularly is important. It is not just about losing weight. It has many health benefits, such as:  Improving your overall  fitness, flexibility, and endurance.  Increasing your bone density.  Helping with weight control.  Decreasing your body fat.  Increasing your muscle strength.  Reducing stress and tension.  Improving your overall health. People with diabetes who exercise gain additional benefits because exercise:  Reduces appetite.  Improves the body's use of blood sugar (glucose).  Helps lower or control blood glucose.  Decreases blood pressure.  Helps control blood lipids (such as cholesterol and triglycerides).  Improves the body's use of the hormone insulin by:  Increasing the body's insulin sensitivity.  Reducing the body's insulin needs.  Decreases the risk for heart disease because exercising:  Lowers cholesterol and triglycerides levels.  Increases the levels of good cholesterol (such as high-density lipoproteins [HDL]) in the body.  Lowers blood glucose levels. YOUR ACTIVITY PLAN  Choose an activity that you enjoy and set realistic goals. Your health care provider or diabetes educator can help you make an activity plan that works for you. Exercise regularly as directed by your health care provider. This includes:  Performing resistance training twice a week such as push-ups, sit-ups, lifting weights, or using resistance bands.  Performing 150 minutes of cardio exercises each week such as walking, running, or playing sports.  Staying active and spending no more than 90 minutes at one time being inactive. Even short bursts of exercise are good for you. Three 10-minute sessions spread throughout the day are just as beneficial as a single 30-minute session. Some exercise ideas include:  Taking the dog for a walk.  Taking the stairs instead of the elevator.  Dancing to your favorite song.  Doing an exercise video.  Doing your favorite exercise with a friend. RECOMMENDATIONS FOR EXERCISING WITH TYPE 1 OR TYPE 2 DIABETES   Check your blood glucose before exercising. If  blood glucose levels are greater than 240 mg/dL, check for urine ketones. Do not exercise if ketones are present.  Avoid injecting insulin into areas of the body that are going to be exercised. For example, avoid injecting insulin into:  The arms when playing tennis.  The legs when jogging.  Keep a record of:  Food intake before and after you exercise.  Expected peak times of insulin action.  Blood glucose levels before and after you exercise.  The type and amount of exercise you have done.  Review your records with your health care provider. Your health care provider will help you to develop guidelines for adjusting food intake and insulin amounts before and after exercising.  If you take insulin or oral hypoglycemic agents, watch for signs and symptoms of hypoglycemia. They include:  Dizziness.  Shaking.  Sweating.  Chills.  Confusion.  Drink plenty of water while you exercise to prevent dehydration or heat stroke. Body water is lost during exercise and must be replaced.  Talk to your health care provider before starting an exercise program to make sure it is safe for you. Remember, almost any type of activity is better than none. Document Released: 10/18/2003 Document Revised: 12/12/2013 Document Reviewed: 01/04/2013 ExitCare Patient Information 2015 [employer]. This information is not intended to replace advice given to you by your health care provider. Make sure you discuss any   questions you have with your health care provider. Chronic Obstructive Pulmonary Disease Chronic obstructive pulmonary disease (COPD) is a common lung condition in which airflow from the lungs is limited. COPD is a general term that can be used to describe many different lung problems that limit airflow, including both chronic bronchitis and emphysema. If you have COPD, your lung function will probably never return to normal, but there are measures you can take to improve lung function and make  yourself feel better.  CAUSES   Smoking (common).   Exposure to secondhand smoke.   Genetic problems.  Chronic inflammatory lung diseases or recurrent infections. SYMPTOMS   Shortness of breath, especially with physical activity.   Deep, persistent (chronic) cough with a large amount of thick mucus.   Wheezing.   Rapid breaths (tachypnea).   Gray or bluish discoloration (cyanosis) of the skin, especially in fingers, toes, or lips.   Fatigue.   Weight loss.   Frequent infections or episodes when breathing symptoms become much worse (exacerbations).   Chest tightness. DIAGNOSIS  Your health care provider will take a medical history and perform a physical examination to make the initial diagnosis. Additional tests for COPD may include:   Lung (pulmonary) function tests.  Chest X-ray.  CT scan.  Blood tests. TREATMENT  Treatment available to help you feel better when you have COPD includes:   Inhaler and nebulizer medicines. These help manage the symptoms of COPD and make your breathing more comfortable.  Supplemental oxygen. Supplemental oxygen is only helpful if you have a low oxygen level in your blood.   Exercise and physical activity. These are beneficial for nearly all people with COPD. Some people may also benefit from a pulmonary rehabilitation program. HOME CARE INSTRUCTIONS   Take all medicines (inhaled or pills) as directed by your health care provider.  Avoid over-the-counter medicines or cough syrups that dry up your airway (such as antihistamines) and slow down the elimination of secretions unless instructed otherwise by your health care provider.   If you are a smoker, the most important thing that you can do is stop smoking. Continuing to smoke will cause further lung damage and breathing trouble. Ask your health care provider for help with quitting smoking. He or she can direct you to community resources or hospitals that provide  support.  Avoid exposure to irritants such as smoke, chemicals, and fumes that aggravate your breathing.  Use oxygen therapy and pulmonary rehabilitation if directed by your health care provider. If you require home oxygen therapy, ask your health care provider whether you should purchase a pulse oximeter to measure your oxygen level at home.   Avoid contact with individuals who have a contagious illness.  Avoid extreme temperature and humidity changes.  Eat healthy foods. Eating smaller, more frequent meals and resting before meals may help you maintain your strength.  Stay active, but balance activity with periods of rest. Exercise and physical activity will help you maintain your ability to do things you want to do.  Preventing infection and hospitalization is very important when you have COPD. Make sure to receive all the vaccines your health care provider recommends, especially the pneumococcal and influenza vaccines. Ask your health care provider whether you need a pneumonia vaccine.  Learn and use relaxation techniques to manage stress.  Learn and use controlled breathing techniques as directed by your health care provider. Controlled breathing techniques include:   Pursed lip breathing. Start by breathing in (inhaling) through your nose for 1  second. Then, purse your lips as if you were going to whistle and breathe out (exhale) through the pursed lips for 2 seconds.   Diaphragmatic breathing. Start by putting one hand on your abdomen just above your waist. Inhale slowly through your nose. The hand on your abdomen should move out. Then purse your lips and exhale slowly. You should be able to feel the hand on your abdomen moving in as you exhale.   Learn and use controlled coughing to clear mucus from your lungs. Controlled coughing is a series of short, progressive coughs. The steps of controlled coughing are:  1. Lean your head slightly forward.  2. Breathe in deeply using  diaphragmatic breathing.  3. Try to hold your breath for 3 seconds.  4. Keep your mouth slightly open while coughing twice.  5. Spit any mucus out into a tissue.  6. Rest and repeat the steps once or twice as needed. SEEK MEDICAL CARE IF:   You are coughing up more mucus than usual.   There is a change in the color or thickness of your mucus.   Your breathing is more labored than usual.   Your breathing is faster than usual.  SEEK IMMEDIATE MEDICAL CARE IF:   You have shortness of breath while you are resting.   You have shortness of breath that prevents you from:  Being able to talk.   Performing your usual physical activities.   You have chest pain lasting longer than 5 minutes.   Your skin color is more cyanotic than usual.  You measure low oxygen saturations for longer than 5 minutes with a pulse oximeter. MAKE SURE YOU:   Understand these instructions.  Will watch your condition.  Will get help right away if you are not doing well or get worse. Document Released: 05/07/2005 Document Revised: 12/12/2013 Document Reviewed: 03/24/2013 [facility] Patient Information 2015 [place], [place]. This information is not intended to replace advice given to you by your health care provider. Make sure you discuss any questions you have with your health care provider.

## 2014-04-20 NOTE — Progress Notes (Signed)
Patient presents for f/u COPD Using oxygen at 2 L via nasal cannula States he's had SHOB and cough productive of yellow sputum for 2 days C/O 2-3 history of low back pain; rates 9/10 at present. Has run out of Tylenol #3 Needs refills on most meds

## 2014-04-20 NOTE — Progress Notes (Signed)
Patient ID: Chris [name], male   DOB: 07/09/1966, 48 y.o.   MRN: [id number]   Chris [name], is a 48 y.o. male  [id number]  [id number]  DOB - Dec 20, 1965  Chief Complaint  Patient presents with  . Follow-up  . COPD        Subjective:   Chris [name] a 48 y.o. male here today for a follow up visit. Patient presents for follow-up of COPD. Patient is using oxygen at 2 L and acetabular. He is complaining of shortness of breath and cough productive of yellow sputum for 2 days. Patient complains of low back pain which is chronic, requesting refill of Tylenol No. 3. Patient also needs refills on his COPD medications. Patient continues to smoke even while on oxygen despite repeated counseling. Patient's other medical history includes HIV disease, emphysema, diabetes mellitus, obesity, and nicotine abuse. Patient has No headache, No chest pain, No abdominal pain - No Nausea, No new weakness tingling or numbnes.  No problems updated.  ALLERGIES: Allergies  Allergen Reactions  . Bactrim [Sulfamethoxazole-Tmp Ds] Hives    PAST MEDICAL HISTORY: Past Medical History  Diagnosis Date  . COPD (chronic obstructive pulmonary disease)   . HIV disease   . Emphysema   . Diabetes mellitus without complication     MEDICATIONS AT HOME: Prior to Admission medications   Medication Sig Start Date End Date Taking? Authorizing Provider  albuterol (PROVENTIL HFA;VENTOLIN HFA) 108 (90 BASE) MCG/ACT inhaler Inhale 2 puffs into the lungs every 4 (four) hours as needed for wheezing or shortness of breath (((PLAN B))). 04/20/14  Yes Dickey Caamano E Hyman Hopes, MD  albuterol (PROVENTIL) (5 MG/ML) 0.5% nebulizer solution Take 2.5 mg by nebulization every 4 (four) hours as needed for wheezing or shortness of breath (((PLAN C))). 05/20/13  Yes Dorothea Ogle, MD  budesonide-formoterol [facility]) 160-4.5 MCG/ACT inhaler Inhale 2 puffs into the lungs 2 (two) times daily. 04/20/14  Yes Quentin Angst, MD  dapsone 100 MG  tablet Take 1 tablet (100 mg total) by mouth daily. 02/08/14  Yes Judyann Munson, MD  darunavir-cobicistat (PREZCOBIX) 800-150 MG per tablet Take 1 tablet by mouth daily. Swallow whole. Do NOT crush, break or chew tablets. Take with food.   Yes Historical Provider, MD  dolutegravir (TIVICAY) 50 MG tablet Take 1 tablet (50 mg total) by mouth daily. 10/25/13  Yes Judyann Munson, MD  insulin aspart (NOVOLOG) 100 UNIT/ML injection Inject 0-20 Units into the skin 3 (three) times daily with meals. CBG 70 - 120: 0 units CBG 121 - 150: 3 units CBG 151 - 200: 4 units CBG 201 - 250: 7 units CBG 251 - 300: 11 units CBG 301 - 350: 15 units CBG 351 - 400: 20 units 01/17/14  Yes Astin Sayre E Altair Appenzeller, MD  insulin glargine (LANTUS) 100 UNIT/ML injection Inject 0.1 mLs (10 Units total) into the skin at bedtime. 12/14/13  Yes Maryann Mikhail, DO  Tiotropium Bromide Monohydrate (SPIRIVA RESPIMAT) 2.5 MCG/ACT AERS 2 puffs each am 04/20/14  Yes Khya Halls E Elayah Klooster, MD  TRUVADA 200-300 MG per tablet TAKE 1 TABLET BY MOUTH DAILY 03/16/14  Yes Judyann Munson, MD  acetaminophen-codeine (TYLENOL #3) 300-30 MG per tablet Take 1 tablet by mouth every 4 (four) hours as needed. 04/20/14   Quentin Angst, MD  clindamycin (CLEOCIN) 150 MG capsule Take 1 capsule (150 mg total) by mouth 3 (three) times daily. 03/21/14   Courtney A Forcucci, PA-C  dextromethorphan-guaiFENesin (MUCINEX DM) 30-600 MG per 12 hr tablet Take 1  tablet by mouth every 12 (twelve) hours as needed for cough.    Historical Provider, MD  doxycycline (VIBRA-TABS) 100 MG tablet Take 1 tablet (100 mg total) by mouth 2 (two) times daily. 04/20/14   Quentin Angst, MD  glucose blood test strip Use as instructed 04/20/14   Quentin Angst, MD  predniSONE (DELTASONE) 10 MG tablet Take  4 each am x 2 days,   2 each am x 2 days,  1 each am x 2 days and stop 04/20/14   Quentin Angst, MD     Objective:   Filed Vitals:   04/20/14 1031  BP: 133/75  Pulse: 91    Temp: 98.6 F (37 C)  TempSrc: Oral  Resp: 18  Height:  (1.753 m)  Weight: 282 lb 12.8 oz (128.277 kg)  SpO2: 92%    Exam General appearance : Awake, alert, not in any distress. Speech Clear. Not toxic looking, obese. HEENT: Atraumatic and Normocephalic, pupils equally reactive to light and accomodation Neck: supple, no JVD. No cervical lymphadenopathy.  Chest: Reduced air entry bilaterally with transmitted sounds and loud snoring, no added sounds  CVS: S1 S2 regular, no murmurs.  Abdomen: Bowel sounds present, Non tender and not distended with no gaurding, rigidity or rebound. Extremities: B/L Lower Ext shows no edema, both legs are warm to touch Neurology: Awake alert, and oriented X 3, CN II-XII intact, Non focal Skin:No Rash Wounds:N/A  Data Review Lab Results  Component Value Date   HGBA1C 6.5 04/20/2014   HGBA1C 8.5* 12/12/2013   HGBA1C 7.0 05/20/2013     Assessment & Plan   1. Type 2 diabetes mellitus with hyperglycemia  - HgB A1c is 6.5% today - Glucose (CBG) - glucose blood test strip; Use as instructed  Dispense: 100 each; Refill: 12  2. COPD exacerbation  - budesonide-formoterol (SYMBICORT) 160-4.5 MCG/ACT inhaler; Inhale 2 puffs into the lungs 2 (two) times daily.  Dispense: 10.2 g; Refill: 3 - predniSONE (DELTASONE) 10 MG tablet; Take  4 each am x 2 days,   2 each am x 2 days,  1 each am x 2 days and stop  Dispense: 14 tablet; Refill: 0 - Tiotropium Bromide Monohydrate (SPIRIVA RESPIMAT) 2.5 MCG/ACT AERS; 2 puffs each am  Dispense: 1 Inhaler; Refill: 3 - albuterol (PROVENTIL HFA;VENTOLIN HFA) 108 (90 BASE) MCG/ACT inhaler; Inhale 2 puffs into the lungs every 4 (four) hours as needed for wheezing or shortness of breath (((PLAN B))).  Dispense: 3 Inhaler; Refill: 3 - doxycycline (VIBRA-TABS) 100 MG tablet; Take 1 tablet (100 mg total) by mouth 2 (two) times daily.  Dispense: 20 tablet; Refill: 0 - acetaminophen-codeine (TYLENOL #3) 300-30 MG per tablet;  Take 1 tablet by mouth every 4 (four) hours as needed.  Dispense: 60 tablet; Refill: 0  3. OSA (obstructive sleep apnea)  Sleep study   Aim for 2-3 Carb Choices per meal (30-45 grams) +/- 1 either way  Aim for 0-15 Carbs per snack if hungry  Include protein in moderation with your meals and snacks  Consider reading food labels for Total Carbohydrate and Fat Grams of foods  Consider checking BG at alternate times per day  Continue taking medication as directed Fruit Punch - find one with no sugar  Measure and decrease portions of carbohydrate foods  Make your plate and don't go back for seconds   Return in about 3 months (around 07/20/2014), or if symptoms worsen or fail to improve, for Hemoglobin A1C and Follow  up, DM, COPD, Follow up HTN.  The patient was given clear instructions to go to ER or return to medical center if symptoms don't improve, worsen or new problems develop. The patient verbalized understanding. The patient was told to call to get lab results if they haven't heard anything in the next week.   This note has been created with Education officer, environmental. Any transcriptional errors are unintentional.    Jeanann Lewandowsky, MD, MHA, FACP, FAAP [facility] [place], [place] [phone]   04/20/2014, 11:13 AM

## 2014-04-25 ENCOUNTER — Telehealth: Payer: Self-pay | Admitting: Internal Medicine

## 2014-04-25 NOTE — Telephone Encounter (Signed)
Pt was prescribed the wrong medications for his meter. The meter that he has is Accu chek Aviva plus. Also, he ran out of his solution for his nebulizer to go into his breathing machine. Please follow up with pt.

## 2014-04-26 NOTE — Telephone Encounter (Signed)
Pt. Is calling back in regards to his Scripts...Marland Kitchenhe needs a new updated machine Accu check Aviva plus, as well as the nebulizer for his breathing machine.Marland KitchenMarland KitchenMarland KitchenMarland Kitchenplease f/u with patient

## 2014-04-28 ENCOUNTER — Telehealth: Payer: Self-pay | Admitting: Internal Medicine

## 2014-04-28 ENCOUNTER — Telehealth: Payer: Self-pay | Admitting: *Deleted

## 2014-04-28 MED ORDER — ALBUTEROL SULFATE (5 MG/ML) 0.5% IN NEBU: 2.5000 mg | INHALATION_SOLUTION | RESPIRATORY_TRACT | Status: DC | PRN

## 2014-04-28 MED ORDER — ACCU-CHEK AVIVA PLUS W/DEVICE KIT: PACK | Status: DC

## 2014-04-28 NOTE — Telephone Encounter (Signed)
Pt called needing nebulizer solution and a glucometer. I sent these RX to his pharmacy.

## 2014-04-28 NOTE — Telephone Encounter (Signed)
Pt. Is calling back in regards to his med refills he needs a new meter Accu Check, and he also needs the nebulizer treatments. Please f/u with pt.

## 2014-05-03 ENCOUNTER — Other Ambulatory Visit: Payer: Self-pay | Admitting: Internal Medicine

## 2014-05-10 ENCOUNTER — Ambulatory Visit (HOSPITAL_BASED_OUTPATIENT_CLINIC_OR_DEPARTMENT_OTHER): Payer: Medicaid Other | Attending: Adult Health

## 2014-05-12 ENCOUNTER — Ambulatory Visit (HOSPITAL_BASED_OUTPATIENT_CLINIC_OR_DEPARTMENT_OTHER): Payer: Medicaid Other | Attending: Adult Health

## 2014-05-16 ENCOUNTER — Encounter: Payer: Self-pay | Admitting: Internal Medicine

## 2014-05-16 ENCOUNTER — Ambulatory Visit (INDEPENDENT_AMBULATORY_CARE_PROVIDER_SITE_OTHER): Payer: Medicaid Other | Admitting: Internal Medicine

## 2014-05-16 VITALS — BP 138/62 | HR 96 | Temp 98.5°F | Ht 69.0 in | Wt 285.4 lb

## 2014-05-16 DIAGNOSIS — J9612 Chronic respiratory failure with hypercapnia: Secondary | ICD-10-CM

## 2014-05-16 DIAGNOSIS — Z720 Tobacco use: Secondary | ICD-10-CM

## 2014-05-16 DIAGNOSIS — F17200 Nicotine dependence, unspecified, uncomplicated: Secondary | ICD-10-CM

## 2014-05-16 DIAGNOSIS — J449 Chronic obstructive pulmonary disease, unspecified: Secondary | ICD-10-CM

## 2014-05-16 NOTE — Progress Notes (Signed)
Subjective:    Patient ID: Chris [name], male    DOB: 10/01/1965   MRN: [id number]    Brief patient profile:  48 yobm smoker with HIV  and doe x 2009 much worse since March 2014 so referred by Manson PasseyAlma Devine to pulmonary clinic 02/15/2013 with GOLD III copd by pfts 04/28/13.  HPI 02/15/2013 1st pulmonary eval  Cc indolent onset progressive doe x walking slow pace more than 100 ft,  not at rest., 02 dep, doe much worse x 3 months assoc with congested cough esp in am with sev tbsp thick white mucus takes about 30 min to clear.  No better on inhalers tried to date including advair and spiriva - albuterol works the best and using lots of saba and nebs rec Stop advair and spiriva Plan A = Automatic = Start symbicort 160 Take 2 puffs first thing in am and then another 2 puffs about 12 hours later.  Plan B = backup= Only use your albuterol (as a rescue medication to be used if you can't catch your breath by resting or doing a relaxed purse lip breathing pattern. The less you use it, the better it will work when you need it. Ok to use up to 2 puffs every 4 hours Plan C = nebulizer, use this only if plan B doesn't work ok to use up to 4 hours  03/15/2013 f/u ov/Chris [name] still smoking  Chief Complaint  Patient presents with  . 4 wk follow up    Breathing worse at times.  Wheezing, chest tightness at times, and cough with yellow to clear mucus at times also reported.  Fatigues easily.   has not followed the previous instructions and symptoms have not really changed  rec Plan A = Automatic = Start symbicort 160 Take 2 puffs first thing in am and then another 2 puffs about 12 hours later.  Plan B = backup= Only use your albuterol (proiare) as a rescue medication to be used if you can't catch your breath by resting or doing a relaxed purse lip breathing pattern. The less you use it, the better it will work when you need it. Ok to use up to 2 puffs every 4 hours Plan C = nebulizer, use this only if plan B doesn't work  after 15 min ok to use up to 4 hours  The key is to stop smoking completely before smoking completely stops you!    04/28/2013 f/u ov/Chris [name] still smoking  re:  GOLD III/ 02 dep @ 2lpm  Chief Complaint  Patient presents with  . Follow-up    Pt states that his breathing has improved, symbicort seems to be helping some. No new co's today. Using proair approx 4 times per wk.   still doe but has learned to pace himself and overall better but somewhat limited from desired activities even on his best days rec Plan A = Automatic =  symbicort 160 Take 2 puffs first thing in am and then another 2 puffs about 12 hours later.  Add spiriva one capsule daily each am Plan B = backup= Only use your albuterol (proiare) as a rescue medication to be used if you can't catch your breath by resting or doing a relaxed purse lip breathing pattern. The less you use it, the better it will work when you need it. Ok to use up to 2 puffs every 4 hours Plan C = nebulizer, use this only if plan B doesn't work after 15 min ok to use up  to 4 hours   02/28/2014 f/u ov/Chris [name] re: copd GOLD III/ smoking only 2x daily / 02 2lpm 24/7/ symbicort / spiriva Chief Complaint  Patient presents with  . Follow-up    Pt states "sometimes my breathing is worse". He states that humidity and heat make breathing seem worse. He c/o prod cough with clear to white sputum.    was on acei, stopped ? One m prior to OV  - cough is day > noct, using saba hfa by around 11 am daily despite compliance with symb/spriva (can't confirm) - says virus is not detectable  >>  03/14/2014 Follow up and Medication review  COPD GOLD III/ smoking only 2x daily / 02 2lpm 24/7/ symbicort  Patient returns for a two-week followup and medication review. We reviewed all his medications. Organized them into a medication calendar with patient education. Patient left a few of his medications at home.  Feels that his breathing has improved since on Spiriva.  Patient has cut  back on smoking. We discussed smoking cessation. Pt has had frequent COPD flare with steroids used x 2 over last 2 months. We discussed sided effects and risk/benefit of frequent steroid use especially along with his HIV meds and ICS inhalers.He verbalized understanding.  rec Use medical med calendar   05/16/2014 f/u ov/Chris [name] re: copd III/ still smoking/ no med calendar /02 dep but not using  Chief Complaint  Patient presents with  . Follow-up    COPD; fatigue; usually has O2 on 2L, did not bring with him today; sometimes wears  2L at night   does not need saba often, sleeps ok despite taking second dose of symbicort 8 h p first  Also supposed to be on maint spiriva but not clear he's using it   No obvious day to day or daytime variabilty or assoc chronic cough or cp or chest tightness, subjective wheeze overt sinus or hb symptoms. No unusual exp hx or h/o childhood pna/ asthma or knowledge of premature birth.  Sleeping ok without nocturnal  or early am exacerbation  of respiratory  c/o's or need for noct saba. Also denies any obvious fluctuation of symptoms with weather or environmental changes or other aggravating or alleviating factors except as outlined above   Current Medications, Allergies, Complete Past Medical History, Past Surgical History, Family History, and Social History were reviewed in [facility] electronic medical record.  ROS  The following are not active complaints unless bolded sore throat, dysphagia, dental problems, itching, sneezing,  nasal congestion or excess/ purulent secretions, ear ache,   fever, chills, sweats, unintended wt loss, pleuritic or exertional cp, hemoptysis,  orthopnea pnd or leg swelling, presyncope, palpitations, heartburn, abdominal pain, anorexia, nausea, vomiting, diarrhea  or change in bowel or urinary habits, change in stools or urine, dysuria,hematuria,  rash, arthralgias, visual complaints, headache, numbness weakness or ataxia or problems with  walking or coordination,  change in mood/affect or memory.                     Objective:   Physical Exam   amb bm nad   Wt Readings from Last 3 Encounters:  05/16/14 285 lb 6.4 oz (129.457 kg)  04/20/14 282 lb 12.8 oz (128.277 kg)  03/14/14 274 lb 3.2 oz (124.376 kg)      HEENT mild turbinate edema.  Oropharynx no thrush or excess pnd or cobblestoning.  No JVD or cervical adenopathy. Mild accessory muscle hypertrophy. Trachea midline, nl thryroid. Chest was hyperinflated by  percussion with diminished breath sounds and moderate increased exp time without wheeze. Hoover sign positive at mid inspiration. Regular rate and rhythm without murmur gallop or rub or increase P2 or edema.  Abd: no hsm, nl excursion. Ext warm without cyanosis or clubbing.     cxr  02/28/14 Mildly lower lung volumes. Stable cardiomegaly and mediastinal  contours. Visualized tracheal air column is within normal limits. No  pneumothorax or pulmonary edema. No pleural effusion or acute  pulmonary opacity. Mild chronic infrahilar atelectasis       Assessment & Plan:

## 2014-05-16 NOTE — Patient Instructions (Addendum)
The key is to stop smoking completely before smoking completely stops you!   See calendar for specific medication instructions and bring it back for each and every office visit for every healthcare provider you see.  Without it,  you may not receive the best quality medical care that we feel you deserve.  You will note that the calendar groups together  your maintenance  medications that are timed at particular times of the day.  Think of this as your checklist for what your doctor has instructed you to do until your next evaluation to see what benefit  there is  to staying on a consistent group of medications intended to keep you well.  The other group at the bottom is entirely up to you to use as you see fit  for specific symptoms that may arise between visits that require you to treat them on an as needed basis.  Think of this as your action plan or "what if" list.   Separating the top medications from the bottom group is fundamental to providing you adequate care going forward.     Please schedule a follow up visit in 6  months but call sooner if needed

## 2014-05-17 NOTE — Assessment & Plan Note (Signed)
-   04/28/2013 PFT's FEV1 1.21 (39%) ratio 59 and 13% better p B2 and dlco 72 corrects to 102  - started spiriva 04/30/2013 > changed to respimat 02/28/2014 -02/28/2014 p extensive coaching HFA effectiveness =    90%  -Med calendar 03/14/2014 > not using as of 05/16/14  -  03/20/2014   Informed insurance not covering respimat    DDX of  difficult airways management all start with A and  include Adherence, Ace Inhibitors, Acid Reflux, Active Sinus Disease, Alpha 1 Antitripsin deficiency, Anxiety masquerading as Airways dz,  ABPA,  allergy(esp in young), Aspiration (esp in elderly), Adverse effects of DPI,  Active smokers, plus two Bs  = Bronchiectasis and Beta blocker use..and one C= CHF   Adherence is always the initial "prime suspect" and is a multilayered concern that requires a "trust but verify" approach in every patient - starting with knowing how to use medications, especially inhalers, correctly, keeping up with refills and understanding the fundamental difference between maintenance and prns vs those medications only taken for a very short course and then stopped and not refilled.  - major challenges keeping him on his 7502 and meds/ poor insight/ not willing to use checklist formatted med calendar so little more we can do here - 05/17/2014 p extensive coaching HFA effectiveness =    90%  Active smoking the other big obstacle to good control of airways > discussed separately.

## 2014-05-17 NOTE — Assessment & Plan Note (Signed)
02 2lpm 24/7 -  HCO3 31 01/23/14   When remembers to wear 02 > Adequate control on present rx, reviewed > no change in rx needed

## 2014-05-17 NOTE — Assessment & Plan Note (Signed)

## 2014-05-25 ENCOUNTER — Encounter: Payer: Self-pay | Admitting: *Deleted

## 2014-05-25 ENCOUNTER — Other Ambulatory Visit: Payer: Medicaid Other

## 2014-05-25 ENCOUNTER — Telehealth: Payer: Self-pay | Admitting: Internal Medicine

## 2014-05-25 ENCOUNTER — Other Ambulatory Visit: Payer: Self-pay | Admitting: Licensed Clinical Social Worker

## 2014-05-25 DIAGNOSIS — B20 Human immunodeficiency virus [HIV] disease: Secondary | ICD-10-CM

## 2014-05-25 LAB — COMPREHENSIVE METABOLIC PANEL
ALK PHOS: 43 U/L (ref 39–117)
ALT: 25 U/L (ref 0–53)
AST: 22 U/L (ref 0–37)
Albumin: 3.9 g/dL (ref 3.5–5.2)
BILIRUBIN TOTAL: 0.6 mg/dL (ref 0.2–1.2)
BUN: 8 mg/dL (ref 6–23)
CO2: 27 meq/L (ref 19–32)
CREATININE: 0.88 mg/dL (ref 0.50–1.35)
Calcium: 8.8 mg/dL (ref 8.4–10.5)
Chloride: 100 mEq/L (ref 96–112)
Glucose, Bld: 123 mg/dL — ABNORMAL HIGH (ref 70–99)
Potassium: 4.1 mEq/L (ref 3.5–5.3)
SODIUM: 135 meq/L (ref 135–145)
Total Protein: 8.1 g/dL (ref 6.0–8.3)

## 2014-05-25 LAB — CBC WITH DIFFERENTIAL/PLATELET
BASOS ABS: 0 10*3/uL (ref 0.0–0.1)
BASOS PCT: 0 % (ref 0–1)
Eosinophils Absolute: 0.1 10*3/uL (ref 0.0–0.7)
Eosinophils Relative: 1 % (ref 0–5)
HCT: 44.9 % (ref 39.0–52.0)
Hemoglobin: 14.7 g/dL (ref 13.0–17.0)
LYMPHS PCT: 30 % (ref 12–46)
Lymphs Abs: 2 10*3/uL (ref 0.7–4.0)
MCH: 28.6 pg (ref 26.0–34.0)
MCHC: 32.7 g/dL (ref 30.0–36.0)
MCV: 87.4 fL (ref 78.0–100.0)
Monocytes Absolute: 0.8 10*3/uL (ref 0.1–1.0)
Monocytes Relative: 12 % (ref 3–12)
NEUTROS PCT: 57 % (ref 43–77)
Neutro Abs: 3.7 10*3/uL (ref 1.7–7.7)
PLATELETS: 184 10*3/uL (ref 150–400)
RBC: 5.14 MIL/uL (ref 4.22–5.81)
RDW: 13.5 % (ref 11.5–15.5)
WBC: 6.5 10*3/uL (ref 4.0–10.5)

## 2014-05-25 NOTE — Progress Notes (Signed)
Patient ID: Chris [name], male   DOB: 10/23/1965, 48 y.o.   MRN: [id number] Pt brushing teeth once daily.  [facility].  Not seen for dental cleaning or exam recently.  Appt [facility] for Tues., Oct 20th @ 1100 for exam and cleaning.  For [facility] lab work today.  Next appt w/ Dr. Drue SecondSnider 06/20/14.

## 2014-05-25 NOTE — Telephone Encounter (Signed)
Patient has come in today to receive a refill on medication acetaminophen-codeine (TYLENOL #3) 300-30 MG per tablet; patient was last seen in clinic 04/20/2014

## 2014-05-26 LAB — T-HELPER CELL (CD4) - (RCID CLINIC ONLY)
CD4 % Helper T Cell: 8 % — ABNORMAL LOW (ref 33–55)
CD4 T Cell Abs: 180 /uL — ABNORMAL LOW (ref 400–2700)

## 2014-05-29 ENCOUNTER — Other Ambulatory Visit: Payer: Self-pay | Admitting: Emergency Medicine

## 2014-05-29 DIAGNOSIS — J441 Chronic obstructive pulmonary disease with (acute) exacerbation: Secondary | ICD-10-CM

## 2014-05-29 LAB — HIV-1 RNA QUANT-NO REFLEX-BLD

## 2014-05-29 MED ORDER — ACETAMINOPHEN-CODEINE #3 300-30 MG PO TABS: 1.0000 | ORAL_TABLET | ORAL | Status: DC | PRN

## 2014-06-05 ENCOUNTER — Ambulatory Visit: Payer: Medicaid Other | Admitting: Internal Medicine

## 2014-06-12 ENCOUNTER — Ambulatory Visit: Payer: Medicaid Other | Attending: Internal Medicine | Admitting: Internal Medicine

## 2014-06-12 ENCOUNTER — Encounter: Payer: Self-pay | Admitting: Internal Medicine

## 2014-06-12 VITALS — BP 138/89 | HR 95 | Temp 98.9°F | Resp 14 | Ht 69.0 in | Wt 284.0 lb

## 2014-06-12 DIAGNOSIS — B20 Human immunodeficiency virus [HIV] disease: Secondary | ICD-10-CM | POA: Diagnosis not present

## 2014-06-12 DIAGNOSIS — G8929 Other chronic pain: Secondary | ICD-10-CM | POA: Diagnosis not present

## 2014-06-12 DIAGNOSIS — Z791 Long term (current) use of non-steroidal anti-inflammatories (NSAID): Secondary | ICD-10-CM | POA: Diagnosis not present

## 2014-06-12 DIAGNOSIS — J449 Chronic obstructive pulmonary disease, unspecified: Secondary | ICD-10-CM | POA: Insufficient documentation

## 2014-06-12 DIAGNOSIS — M545 Low back pain: Secondary | ICD-10-CM | POA: Diagnosis not present

## 2014-06-12 DIAGNOSIS — J441 Chronic obstructive pulmonary disease with (acute) exacerbation: Secondary | ICD-10-CM | POA: Diagnosis not present

## 2014-06-12 DIAGNOSIS — Z794 Long term (current) use of insulin: Secondary | ICD-10-CM | POA: Insufficient documentation

## 2014-06-12 DIAGNOSIS — Z7951 Long term (current) use of inhaled steroids: Secondary | ICD-10-CM | POA: Insufficient documentation

## 2014-06-12 DIAGNOSIS — N529 Male erectile dysfunction, unspecified: Secondary | ICD-10-CM | POA: Diagnosis not present

## 2014-06-12 DIAGNOSIS — F17200 Nicotine dependence, unspecified, uncomplicated: Secondary | ICD-10-CM | POA: Insufficient documentation

## 2014-06-12 DIAGNOSIS — G4733 Obstructive sleep apnea (adult) (pediatric): Secondary | ICD-10-CM | POA: Insufficient documentation

## 2014-06-12 DIAGNOSIS — Z9981 Dependence on supplemental oxygen: Secondary | ICD-10-CM | POA: Insufficient documentation

## 2014-06-12 DIAGNOSIS — E119 Type 2 diabetes mellitus without complications: Secondary | ICD-10-CM

## 2014-06-12 LAB — GLUCOSE, POCT (MANUAL RESULT ENTRY): POC Glucose: 97 mg/dl (ref 70–99)

## 2014-06-12 MED ORDER — BUDESONIDE-FORMOTEROL FUMARATE 160-4.5 MCG/ACT IN AERO: 2.0000 | INHALATION_SPRAY | Freq: Two times a day (BID) | RESPIRATORY_TRACT | Status: DC

## 2014-06-12 MED ORDER — TIOTROPIUM BROMIDE MONOHYDRATE 2.5 MCG/ACT IN AERS: INHALATION_SPRAY | RESPIRATORY_TRACT | Status: DC

## 2014-06-12 MED ORDER — INSULIN PEN NEEDLE 32G X 5 MM MISC: Status: DC

## 2014-06-12 MED ORDER — ACETAMINOPHEN-CODEINE #3 300-30 MG PO TABS: 1.0000 | ORAL_TABLET | ORAL | Status: DC | PRN

## 2014-06-12 NOTE — Patient Instructions (Signed)

## 2014-06-12 NOTE — Progress Notes (Signed)
Patient ID: Chris [name], male   DOB: 03-19-1966, 48 y.o.   MRN: [id number]   Chris [name], is a 48 y.o. male  [id number]  [id number]  DOB - May 18, 1966  Chief Complaint  Patient presents with  . Follow-up        Subjective:   Chris [name] a 48 y.o. male here today for a follow up visit. Patient with history significant for COPD on home oxygen, HIV disease, diabetes mellitus here today for routine follow-up. Patient states that he goes to the bathroom frequently and also complaining of erectile dysfunction, he wants to know if this is related to his diabetes, or he has prostate problem or is it because of medications. Patient also complained of chronic low back pain, needs to refill of his pain medications. Patient continued to smoke heavily despite COPD on oxygen and repeated counseling and referral for assistance. Patient snores loudly and snort while talking. Patient claims compliant with medications, follows up regularly with infectious disease specialist and pulmonologist. Patient has No headache, No chest pain, No abdominal pain - No Nausea, No new weakness tingling or numbness.  No problems updated.  ALLERGIES: Allergies  Allergen Reactions  . Bactrim [Sulfamethoxazole-Trimethoprim] Hives    PAST MEDICAL HISTORY: Past Medical History  Diagnosis Date  . COPD (chronic obstructive pulmonary disease)   . HIV disease   . Emphysema   . Diabetes mellitus without complication     MEDICATIONS AT HOME: Prior to Admission medications   Medication Sig Start Date End Date Taking? Authorizing Provider  acetaminophen-codeine (TYLENOL #3) 300-30 MG per tablet Take 1 tablet by mouth every 4 (four) hours as needed. 06/12/14  Yes Tresa Garter, MD  albuterol (PROVENTIL HFA;VENTOLIN HFA) 108 (90 BASE) MCG/ACT inhaler Inhale 2 puffs into the lungs every 4 (four) hours as needed for wheezing or shortness of breath (((PLAN B))). 04/20/14  Yes Jevante Hollibaugh E Doreene Burke, MD  albuterol  (PROVENTIL) (5 MG/ML) 0.5% nebulizer solution Take 0.5 mLs (2.5 mg total) by nebulization every 4 (four) hours as needed for wheezing or shortness of breath (((PLAN C))). 04/28/14  Yes Tresa Garter, MD  Blood Glucose Monitoring Suppl (ACCU-CHEK AVIVA PLUS) W/DEVICE KIT 250 Test up to 4 times daily 04/28/14  Yes Tresa Garter, MD  budesonide-formoterol (SYMBICORT) 160-4.5 MCG/ACT inhaler Inhale 2 puffs into the lungs 2 (two) times daily. 06/12/14  Yes Tresa Garter, MD  dapsone 100 MG tablet Take 1 tablet (100 mg total) by mouth daily. 02/08/14  Yes Carlyle Basques, MD  darunavir-cobicistat (PREZCOBIX) 800-150 MG per tablet Take 1 tablet by mouth daily. Swallow whole. Do NOT crush, break or chew tablets. Take with food.   Yes Historical Provider, MD  dolutegravir (TIVICAY) 50 MG tablet Take 1 tablet (50 mg total) by mouth daily. 10/25/13  Yes Carlyle Basques, MD  glucose blood test strip Use as instructed 04/20/14  Yes Laporsha Grealish E Doreene Burke, MD  insulin aspart (NOVOLOG) 100 UNIT/ML injection Inject 0-20 Units into the skin 3 (three) times daily with meals. CBG 70 - 120: 0 units CBG 121 - 150: 3 units CBG 151 - 200: 4 units CBG 201 - 250: 7 units CBG 251 - 300: 11 units CBG 301 - 350: 15 units CBG 351 - 400: 20 units 01/17/14  Yes Kiya Eno E Jerie Basford, MD  insulin glargine (LANTUS) 100 UNIT/ML injection Inject 0.1 mLs (10 Units total) into the skin at bedtime. 12/14/13  Yes Maryann Mikhail, DO  Tiotropium Bromide Monohydrate (SPIRIVA RESPIMAT) 2.5 MCG/ACT AERS  2 puffs each am 06/12/14  Yes Tresa Garter, MD  TRUVADA 200-300 MG per tablet TAKE 1 TABLET BY MOUTH EVERY DAY 05/03/14  Yes Carlyle Basques, MD  clindamycin (CLEOCIN) 150 MG capsule Take 1 capsule (150 mg total) by mouth 3 (three) times daily. 03/21/14   Courtney A Forcucci, PA-C  dextromethorphan-guaiFENesin (MUCINEX DM) 30-600 MG per 12 hr tablet Take 1 tablet by mouth every 12 (twelve) hours as needed for cough.    Historical  Provider, MD  doxycycline (VIBRA-TABS) 100 MG tablet Take 1 tablet (100 mg total) by mouth 2 (two) times daily. 04/20/14   Tresa Garter, MD  Insulin Pen Needle 32G X 5 MM MISC Use as directed 06/12/14   Tresa Garter, MD  predniSONE (DELTASONE) 10 MG tablet Take  4 each am x 2 days,   2 each am x 2 days,  1 each am x 2 days and stop 04/20/14   Tresa Garter, MD     Objective:   Filed Vitals:   06/12/14 1631  BP: 138/89  Pulse: 95  Temp: 98.9 F (37.2 C)  TempSrc: Oral  Resp: 14  Height: 5' 9" (1.753 m)  Weight: 284 lb (128.822 kg)  SpO2: 86%    Exam General appearance : Awake, alert, not in any distress. Speech Clear. Not toxic looking HEENT: Atraumatic and Normocephalic, pupils equally reactive to light and accomodation Neck: supple, no JVD. No cervical lymphadenopathy.  Chest: Reduced air entry bilaterally, transmitted sounds bilaterally CVS: S1 S2 regular, no murmurs.  Abdomen: Bowel sounds present, Non tender and not distended with no gaurding, rigidity or rebound. Extremities: B/L Lower Ext shows no edema, both legs are warm to touch Neurology: Awake alert, and oriented X 3, CN II-XII intact, Non focal Skin:No Rash Wounds:N/A  Data Review Lab Results  Component Value Date   HGBA1C 6.5 04/20/2014   HGBA1C 8.5* 12/12/2013   HGBA1C 7.0 05/20/2013     Assessment & Plan   1. Type 2 diabetes mellitus without complication  - Glucose (CBG)   Aim for 2-3 Carb Choices per meal (30-45 grams) +/- 1 either way  Aim for 0-15 Carbs per snack if hungry  Include protein in moderation with your meals and snacks  Consider reading food labels for Total Carbohydrate and Fat Grams of foods  Consider checking BG at alternate times per day  Continue taking medication as directed Fruit Punch - find one with no sugar  Measure and decrease portions of carbohydrate foods  Make your plate and don't go back for seconds   2. COPD exacerbation  -  acetaminophen-codeine (TYLENOL #3) 300-30 MG per tablet; Take 1 tablet by mouth every 4 (four) hours as needed.  Dispense: 90 tablet; Refill: 0 - budesonide-formoterol (SYMBICORT) 160-4.5 MCG/ACT inhaler; Inhale 2 puffs into the lungs 2 (two) times daily.  Dispense: 10.2 g; Refill: 3 - Tiotropium Bromide Monohydrate (SPIRIVA RESPIMAT) 2.5 MCG/ACT AERS; 2 puffs each am  Dispense: 1 Inhaler; Refill: 3  3. OSA (obstructive sleep apnea)  - Split night study; Future  The patient was counseled on the dangers of tobacco use, and was advised to quit. Reviewed strategies to maximize success, including removing cigarettes and smoking materials from environment, stress management and support of family/friends.  His questions and concerns were answered, erectile dysfunction and polyuria likely to be multifactorial especially from diabetes, patient was encouraged to comply with blood sugar control and reduce weight, quit smoking and improve general functional capacity, there may be  improvement, otherwise we will investigate further.  .Return in about 3 months (around 09/12/2014), or if symptoms worsen or fail to improve, for Hemoglobin A1C and Follow up, DM, Follow up HTN.  The patient was given clear instructions to go to ER or return to medical center if symptoms don't improve, worsen or new problems develop. The patient verbalized understanding. The patient was told to call to get lab results if they haven't heard anything in the next week.   This note has been created with Surveyor, quantity. Any transcriptional errors are unintentional.    Angelica Chessman, MD, [place], [place], [place] and [place], [place]   06/12/2014, 5:10 PM

## 2014-06-12 NOTE — Progress Notes (Signed)
Pt is here concerned about his prostate. Pt states that he goes to the bathroom frequently and he claims to have ED. Pt is following up on his chronic lower back pain and his diabetes. Pt needs refills for his medications. Pt is still smoking.

## 2014-06-19 ENCOUNTER — Other Ambulatory Visit: Payer: Self-pay | Admitting: Internal Medicine

## 2014-06-20 ENCOUNTER — Encounter: Payer: Self-pay | Admitting: Internal Medicine

## 2014-06-20 ENCOUNTER — Ambulatory Visit (INDEPENDENT_AMBULATORY_CARE_PROVIDER_SITE_OTHER): Payer: Medicaid Other | Admitting: Internal Medicine

## 2014-06-20 VITALS — BP 134/81 | HR 100 | Temp 97.8°F | Wt 282.0 lb

## 2014-06-20 DIAGNOSIS — Z794 Long term (current) use of insulin: Secondary | ICD-10-CM

## 2014-06-20 DIAGNOSIS — E119 Type 2 diabetes mellitus without complications: Secondary | ICD-10-CM

## 2014-06-20 DIAGNOSIS — J441 Chronic obstructive pulmonary disease with (acute) exacerbation: Secondary | ICD-10-CM

## 2014-06-20 DIAGNOSIS — Z21 Asymptomatic human immunodeficiency virus [HIV] infection status: Secondary | ICD-10-CM

## 2014-06-20 DIAGNOSIS — R6882 Decreased libido: Secondary | ICD-10-CM

## 2014-06-20 DIAGNOSIS — IMO0001 Reserved for inherently not codable concepts without codable children: Secondary | ICD-10-CM

## 2014-06-20 MED ORDER — DARUNAVIR ETHANOLATE 800 MG PO TABS: 800.0000 mg | ORAL_TABLET | Freq: Every day | ORAL | Status: DC

## 2014-06-20 MED ORDER — ELVITEG-COBIC-EMTRICIT-TENOFDF 150-150-200-300 MG PO TABS: 1.0000 | ORAL_TABLET | Freq: Every day | ORAL | Status: DC

## 2014-06-20 NOTE — Progress Notes (Signed)
Patient ID: Chris [name], male   DOB: 03/23/1966, 48 y.o.   MRN: [id number]       Patient ID: Chris [name], male   DOB: [birth year], 48 y.o.   MRN: [id number]  HPI 48yo M with HIV disease,CD 4 count of 180/VL<20, on DLG,prezcobix, and truvada. he has genotype of : L74V,K101E,V118I,Y181C,G190S. He also has IDDM, COPD on 2LNC supp, OSA who presents for regular follow up. He comes to clinic without wearing oxygen and reports dyspnea this morning with ambulation. No recent illness. He does report decrease sexual drive. Also does not have insulin syringes  Outpatient Encounter Prescriptions as of 06/20/2014  Medication Sig  . albuterol (PROVENTIL HFA;VENTOLIN HFA) 108 (90 BASE) MCG/ACT inhaler Inhale 2 puffs into the lungs every 4 (four) hours as needed for wheezing or shortness of breath (((PLAN B))).  [name] albuterol (PROVENTIL) (5 MG/ML) 0.5% nebulizer solution Take 0.5 mLs (2.5 mg total) by nebulization every 4 (four) hours as needed for wheezing or shortness of breath (((PLAN C))).  . doxycycline (VIBRA-TABS) 100 MG tablet Take 1 tablet (100 mg total) by mouth 2 (two) times daily.  [name] glucose blood test strip Use as instructed  . insulin aspart (NOVOLOG) 100 UNIT/ML injection Inject 0-20 Units into the skin 3 (three) times daily with meals. CBG 70 - 120: 0 units CBG 121 - 150: 3 units CBG 151 - 200: 4 units CBG 201 - 250: 7 units CBG 251 - 300: 11 units CBG 301 - 350: 15 units CBG 351 - 400: 20 units  . insulin glargine (LANTUS) 100 UNIT/ML injection Inject 0.1 mLs (10 Units total) into the skin at bedtime.  . Insulin Pen Needle 32G X 5 MM MISC Use as directed  . acetaminophen-codeine (TYLENOL #3) 300-30 MG per tablet Take 1 tablet by mouth every 4 (four) hours as needed.  . Blood Glucose Monitoring Suppl (ACCU-CHEK AVIVA PLUS) W/DEVICE KIT 250 Test up to 4 times daily  . budesonide-formoterol (SYMBICORT) 160-4.5 MCG/ACT inhaler Inhale 2 puffs into the lungs 2 (two) times daily.  . clindamycin  (CLEOCIN) 150 MG capsule Take 1 capsule (150 mg total) by mouth 3 (three) times daily.  . dapsone 100 MG tablet Take 1 tablet (100 mg total) by mouth daily.  . darunavir-cobicistat (PREZCOBIX) 800-150 MG per tablet Take 1 tablet by mouth daily. Swallow whole. Do NOT crush, break or chew tablets. Take with food.  [name] dextromethorphan-guaiFENesin (MUCINEX DM) 30-600 MG per 12 hr tablet Take 1 tablet by mouth every 12 (twelve) hours as needed for cough.  . dolutegravir (TIVICAY) 50 MG tablet Take 1 tablet (50 mg total) by mouth daily.  . predniSONE (DELTASONE) 10 MG tablet Take  4 each am x 2 days,   2 each am x 2 days,  1 each am x 2 days and stop  . Tiotropium Bromide Monohydrate (SPIRIVA RESPIMAT) 2.5 MCG/ACT AERS 2 puffs each am  . TRUVADA 200-300 MG per tablet TAKE 1 TABLET BY MOUTH EVERY DAY  . [DISCONTINUED] acetaminophen-codeine (TYLENOL #3) 300-30 MG per tablet Take 1 tablet by mouth every 4 (four) hours as needed.  . [DISCONTINUED] budesonide-formoterol (SYMBICORT) 160-4.5 MCG/ACT inhaler Inhale 2 puffs into the lungs 2 (two) times daily.  . [DISCONTINUED] Tiotropium Bromide Monohydrate (SPIRIVA RESPIMAT) 2.5 MCG/ACT AERS 2 puffs each am  . [DISCONTINUED] TRUVADA 200-300 MG per tablet TAKE 1 TABLET BY MOUTH EVERY DAY     Patient Active Problem List   Diagnosis Date Noted  . Dyspnea 12/12/2013  . Acute  on chronic respiratory failure 12/12/2013  . COPD exacerbation 08/23/2013  . Cyst (solitary) of breast 08/23/2013  . Breast abscess 08/23/2013  . OSA (obstructive sleep apnea) 08/23/2013  . Smoker 02/17/2013  . Hyperglycemia 02/17/2013  . Uncontrolled diabetes mellitus 02/17/2013  . DM (diabetes mellitus) 01/27/2013  . Steroid-induced hyperglycemia 01/12/2013  . Chronic respiratory failure 01/12/2013  . COPD  GOLD III, still smoking 01/11/2013  . Nicotine abuse 01/11/2013  . HIV disease 12/06/2012     Health Maintenance Due  Topic Date Due  . FOOT EXAM  04/12/1976  .  OPHTHALMOLOGY EXAM  04/12/1976  . URINE MICROALBUMIN  04/12/1976  . TETANUS/TDAP  04/12/1985     Review of Systems positiver pertinents listed in hpi Physical Exam   BP 134/81 mmHg  Pulse 100  Temp(Src) 97.8 F (36.6 C) (Oral)  Wt 282 lb (127.914 kg)  SpO2 86% Physical Exam  Constitutional: He is oriented to person, place, and time. He appears well-developed and well-nourished. No distress.  HENT:  Mouth/Throat: Oropharynx is clear and moist. No oropharyngeal exudate.  Cardiovascular: Normal rate, regular rhythm and normal heart sounds. Exam reveals no gallop and no friction rub.  No murmur heard.  Pulmonary/Chest: Effort normal and breath sounds normal. No respiratory distress. He has no wheezes.  Abdominal: Soft. Bowel sounds are normal. He exhibits no distension. There is no tenderness.  Lymphadenopathy:  He has no cervical adenopathy.  Neurological: He is alert and oriented to person, place, and time.  Skin: Skin is warm and dry. No rash noted. No erythema.  Psychiatric: He has a normal mood and affect. His behavior is normal.    Lab Results  Component Value Date   CD4TCELL 8* 05/25/2014   Lab Results  Component Value Date   CD4TABS 180* 05/25/2014   CD4TABS 180* 01/23/2014   CD4TABS 120* 10/25/2013   Lab Results  Component Value Date   HIV1RNAQUANT <20 05/25/2014   Lab Results  Component Value Date   HEPBSAB REACTIVE* 12/06/2012   No results found for: RPR  CBC Lab Results  Component Value Date   WBC 6.5 05/25/2014   RBC 5.14 05/25/2014   HGB 14.7 05/25/2014   HCT 44.9 05/25/2014   PLT 184 05/25/2014   MCV 87.4 05/25/2014   MCH 28.6 05/25/2014   MCHC 32.7 05/25/2014   RDW 13.5 05/25/2014   LYMPHSABS 2.0 05/25/2014   MONOABS 0.8 05/25/2014   EOSABS 0.1 05/25/2014   BASOSABS 0.0 05/25/2014   BMET Lab Results  Component Value Date   NA 135 05/25/2014   K 4.1 05/25/2014   CL 100 05/25/2014   CO2 27 05/25/2014   GLUCOSE 123* 05/25/2014   BUN  8 05/25/2014   CREATININE 0.88 05/25/2014   CALCIUM 8.8 05/25/2014   GFRNONAA 88 01/23/2014   GFRAA >89 01/23/2014     Assessment and Plan   hiv = well controlled, but cd 4 count remains < 200 requiring oi proph. We will change him from dlg/truvada/prezcobix to stribild plus darunavir to simplify his regimen.   COPD/dyspnea = somewhat unclear why he left home without oxygen supplementation. No recent uri. His o2 sats improved to acceptable level while receiving sup oxygen in clinic. Reminded him not to be without his oxygen.  Decrease sexual drive = he is able to get erection but unable to maintain it. We will check testosterone level to see if supplementation could help symptoms  Dm = will give script for insulin syringes  Health maintenance = has had  flu shot  rtc in 3 months

## 2014-08-15 ENCOUNTER — Ambulatory Visit: Payer: Medicaid Other | Attending: Internal Medicine | Admitting: Internal Medicine

## 2014-08-15 ENCOUNTER — Telehealth: Payer: Self-pay | Admitting: *Deleted

## 2014-08-15 VITALS — BP 118/73 | HR 86 | Temp 98.4°F | Resp 18

## 2014-08-15 DIAGNOSIS — M25569 Pain in unspecified knee: Secondary | ICD-10-CM | POA: Diagnosis not present

## 2014-08-15 DIAGNOSIS — N4889 Other specified disorders of penis: Secondary | ICD-10-CM | POA: Diagnosis not present

## 2014-08-15 DIAGNOSIS — E119 Type 2 diabetes mellitus without complications: Secondary | ICD-10-CM | POA: Diagnosis not present

## 2014-08-15 DIAGNOSIS — Z794 Long term (current) use of insulin: Secondary | ICD-10-CM | POA: Insufficient documentation

## 2014-08-15 DIAGNOSIS — R202 Paresthesia of skin: Secondary | ICD-10-CM | POA: Insufficient documentation

## 2014-08-15 DIAGNOSIS — K146 Glossodynia: Secondary | ICD-10-CM | POA: Diagnosis not present

## 2014-08-15 DIAGNOSIS — J441 Chronic obstructive pulmonary disease with (acute) exacerbation: Secondary | ICD-10-CM

## 2014-08-15 DIAGNOSIS — F17210 Nicotine dependence, cigarettes, uncomplicated: Secondary | ICD-10-CM | POA: Insufficient documentation

## 2014-08-15 DIAGNOSIS — B370 Candidal stomatitis: Secondary | ICD-10-CM | POA: Diagnosis not present

## 2014-08-15 DIAGNOSIS — E1165 Type 2 diabetes mellitus with hyperglycemia: Secondary | ICD-10-CM

## 2014-08-15 LAB — GLUCOSE, POCT (MANUAL RESULT ENTRY): POC Glucose: 313 mg/dl — AB (ref 70–99)

## 2014-08-15 MED ORDER — ACETAMINOPHEN-CODEINE #3 300-30 MG PO TABS: 1.0000 | ORAL_TABLET | ORAL | Status: DC | PRN

## 2014-08-15 MED ORDER — FLUCONAZOLE 200 MG PO TABS: 200.0000 mg | ORAL_TABLET | Freq: Once | ORAL | Status: DC

## 2014-08-15 NOTE — Progress Notes (Signed)
Patient walked in c/o 2 week hx tingling and burning pain on tongue; rates 7/10 at present States he also has white patches on tongue and lips are cracking at corners States he's had red bumps on penis with swelling and intermittent pain same time frame; rates 7/10 at present States he had oral thrush 1 year ago and feels this may be the same thing States swish and swallow does not work for him but oral tab does. Does not know name of med that helped in past States he rinses his mouth after every use of symbicort inhaler Patient states he is currently smoking 2 cigs/day Patient walked in on 2 L via [place] Requesting refill on Tylenol #3 for chronic back and knee pain  BP 118/73 P 86 T  98.4 oral R 18 SPO2 96% on 2 L O2  CBG 313 patient states that he had a half can of regular soda before he came in. States he has not eaten so has not yet taken his sliding scale insulin Patient will go directly home to eat and take sliding scale insulin. PCP aware  On exam: Tongue: whitish patches present Penis: uncircumcised; tip very red and whitish/yellowish residue noted when foreskin pulled back.   Per PCP: Diflucan 200 mg daily for 7 days. [facility] [address] May refill Tylenol #3  Patient states he has been asked to provide documentation regarding his inability to work due to medical conditions. Advised pt to make appt with PCP to discuss

## 2014-08-15 NOTE — Telephone Encounter (Signed)
Patient walked into clinic c/o bump in his mouth and on his penis. No available appointments today; [facility]. Patient will go there to request an appointment. Wendall MolaJacqueline Camry Robello

## 2014-09-01 ENCOUNTER — Encounter (HOSPITAL_BASED_OUTPATIENT_CLINIC_OR_DEPARTMENT_OTHER): Payer: Medicaid Other

## 2014-09-18 ENCOUNTER — Other Ambulatory Visit: Payer: Self-pay | Admitting: Internal Medicine

## 2014-09-19 ENCOUNTER — Telehealth: Payer: Self-pay | Admitting: Internal Medicine

## 2014-09-19 NOTE — Telephone Encounter (Signed)
Pt requesting refill Rx Tylenol #3

## 2014-09-19 NOTE — Telephone Encounter (Signed)
Patient is calling to request a refill for his pain medication, and a refill on his Tiotropium Bromide Monohydrate (SPIRIVA RESPIMAT) 2.5 MCG/ACT AERS. Please f/u with pt.

## 2014-09-20 ENCOUNTER — Telehealth: Payer: Self-pay | Admitting: *Deleted

## 2014-09-20 ENCOUNTER — Other Ambulatory Visit: Payer: Self-pay | Admitting: *Deleted

## 2014-09-20 ENCOUNTER — Telehealth: Payer: Self-pay | Admitting: Internal Medicine

## 2014-09-20 DIAGNOSIS — J441 Chronic obstructive pulmonary disease with (acute) exacerbation: Secondary | ICD-10-CM

## 2014-09-20 MED ORDER — TIOTROPIUM BROMIDE MONOHYDRATE 2.5 MCG/ACT IN AERS: INHALATION_SPRAY | RESPIRATORY_TRACT | Status: DC

## 2014-09-20 NOTE — Telephone Encounter (Signed)
Grover CanavanKrystal from [facility] called stating that she will be sending orders for the patients oxygen and that the patient needs a in office oximetry test.

## 2014-09-21 ENCOUNTER — Encounter: Payer: Self-pay | Admitting: Internal Medicine

## 2014-09-21 ENCOUNTER — Ambulatory Visit (INDEPENDENT_AMBULATORY_CARE_PROVIDER_SITE_OTHER): Payer: Medicaid Other | Admitting: Internal Medicine

## 2014-09-21 VITALS — BP 150/94 | HR 97 | Temp 98.0°F | Ht 69.0 in | Wt 278.0 lb

## 2014-09-21 DIAGNOSIS — N521 Erectile dysfunction due to diseases classified elsewhere: Secondary | ICD-10-CM

## 2014-09-21 DIAGNOSIS — R350 Frequency of micturition: Secondary | ICD-10-CM

## 2014-09-21 DIAGNOSIS — Z21 Asymptomatic human immunodeficiency virus [HIV] infection status: Secondary | ICD-10-CM

## 2014-09-21 DIAGNOSIS — R809 Proteinuria, unspecified: Secondary | ICD-10-CM

## 2014-09-21 DIAGNOSIS — I10 Essential (primary) hypertension: Secondary | ICD-10-CM

## 2014-09-21 LAB — CBC WITH DIFFERENTIAL/PLATELET
BASOS ABS: 0.1 10*3/uL (ref 0.0–0.1)
Basophils Relative: 1 % (ref 0–1)
Eosinophils Absolute: 0.1 10*3/uL (ref 0.0–0.7)
Eosinophils Relative: 2 % (ref 0–5)
HEMATOCRIT: 47.1 % (ref 39.0–52.0)
HEMOGLOBIN: 15.1 g/dL (ref 13.0–17.0)
LYMPHS PCT: 40 % (ref 12–46)
Lymphs Abs: 2.3 10*3/uL (ref 0.7–4.0)
MCH: 28.7 pg (ref 26.0–34.0)
MCHC: 32.1 g/dL (ref 30.0–36.0)
MCV: 89.4 fL (ref 78.0–100.0)
MONOS PCT: 11 % (ref 3–12)
MPV: 11 fL (ref 8.6–12.4)
Monocytes Absolute: 0.6 10*3/uL (ref 0.1–1.0)
NEUTROS ABS: 2.6 10*3/uL (ref 1.7–7.7)
Neutrophils Relative %: 46 % (ref 43–77)
Platelets: 201 10*3/uL (ref 150–400)
RBC: 5.27 MIL/uL (ref 4.22–5.81)
RDW: 13.1 % (ref 11.5–15.5)
WBC: 5.7 10*3/uL (ref 4.0–10.5)

## 2014-09-21 LAB — COMPLETE METABOLIC PANEL WITH GFR
ALK PHOS: 45 U/L (ref 39–117)
ALT: 24 U/L (ref 0–53)
AST: 21 U/L (ref 0–37)
Albumin: 4 g/dL (ref 3.5–5.2)
BILIRUBIN TOTAL: 0.5 mg/dL (ref 0.2–1.2)
BUN: 9 mg/dL (ref 6–23)
CO2: 32 mEq/L (ref 19–32)
CREATININE: 0.77 mg/dL (ref 0.50–1.35)
Calcium: 8.8 mg/dL (ref 8.4–10.5)
Chloride: 99 mEq/L (ref 96–112)
GFR, Est African American: >89 mL/min
GFR, Est Non African American: >89 mL/min
GLUCOSE: 178 mg/dL — AB (ref 70–99)
Potassium: 4 mEq/L (ref 3.5–5.3)
SODIUM: 136 meq/L (ref 135–145)
TOTAL PROTEIN: 8.6 g/dL — AB (ref 6.0–8.3)

## 2014-09-21 NOTE — Progress Notes (Signed)
Patient ID: Chris [name], male   DOB: 1966/07/30, 49 y.o.   MRN: [id number]       Patient ID: Kaien Pezzullo, male   DOB: 1966-03-20, 49 y.o.   MRN: [id number]  HPI 49yo M with HIV disease, CD 4 count of 180/VL<20 on stribild/DRVr. He states that he has frequent urination often at night, that precludes him from sleeping well. He has sleep study coming up to determine degree of OSA and need for CPAP which he currently does not wear.  He is mentioned that he is concern with his sexual drive, unable to sustain an erection x 4-5 years.   Outpatient Encounter Prescriptions as of 09/21/2014  Medication Sig  . acetaminophen-codeine (TYLENOL #3) 300-30 MG per tablet Take 1 tablet by mouth every 4 (four) hours as needed.  [name] albuterol (PROVENTIL HFA;VENTOLIN HFA) 108 (90 BASE) MCG/ACT inhaler Inhale 2 puffs into the lungs every 4 (four) hours as needed for wheezing or shortness of breath (((PLAN B))).  [name] albuterol (PROVENTIL) (5 MG/ML) 0.5% nebulizer solution Take 0.5 mLs (2.5 mg total) by nebulization every 4 (four) hours as needed for wheezing or shortness of breath (((PLAN C))).  . Blood Glucose Monitoring Suppl (ACCU-CHEK AVIVA PLUS) W/DEVICE KIT 250 Test up to 4 times daily  . budesonide-formoterol (SYMBICORT) 160-4.5 MCG/ACT inhaler Inhale 2 puffs into the lungs 2 (two) times daily.  . dapsone 100 MG tablet Take 1 tablet (100 mg total) by mouth daily.  . Darunavir Ethanolate (PREZISTA) 800 MG tablet Take 1 tablet (800 mg total) by mouth daily with breakfast.  . elvitegravir-cobicistat-emtricitabine-tenofovir (STRIBILD) 150-150-200-300 MG TABS tablet Take 1 tablet by mouth daily with breakfast.  . fluconazole (DIFLUCAN) 200 MG tablet Take 1 tablet (200 mg total) by mouth once. Take 1 tablet (200 mg total) by mouth daily for 7 days  . glucose blood test strip Use as instructed  . insulin aspart (NOVOLOG) 100 UNIT/ML injection Inject 0-20 Units into the skin 3 (three) times daily with meals. CBG 70 - 120: 0  units CBG 121 - 150: 3 units CBG 151 - 200: 4 units CBG 201 - 250: 7 units CBG 251 - 300: 11 units CBG 301 - 350: 15 units CBG 351 - 400: 20 units  . insulin glargine (LANTUS) 100 UNIT/ML injection Inject 0.1 mLs (10 Units total) into the skin at bedtime.  . Tiotropium Bromide Monohydrate (SPIRIVA RESPIMAT) 2.5 MCG/ACT AERS 2 puffs each am  . dextromethorphan-guaiFENesin (MUCINEX DM) 30-600 MG per 12 hr tablet Take 1 tablet by mouth every 12 (twelve) hours as needed for cough.  . Insulin Pen Needle 32G X 5 MM MISC Use as directed  . predniSONE (DELTASONE) 10 MG tablet Take  4 each am x 2 days,   2 each am x 2 days,  1 each am x 2 days and stop (Patient not taking: Reported on 08/15/2014)     Patient Active Problem List   Diagnosis Date Noted  . Dyspnea 12/12/2013  . Acute on chronic respiratory failure 12/12/2013  . COPD exacerbation 08/23/2013  . Cyst (solitary) of breast 08/23/2013  . Breast abscess 08/23/2013  . OSA (obstructive sleep apnea) 08/23/2013  . Smoker 02/17/2013  . Hyperglycemia 02/17/2013  . Uncontrolled diabetes mellitus 02/17/2013  . DM (diabetes mellitus) 01/27/2013  . Steroid-induced hyperglycemia 01/12/2013  . Chronic respiratory failure 01/12/2013  . COPD  GOLD III, still smoking 01/11/2013  . Nicotine abuse 01/11/2013  . HIV disease 12/06/2012     Health Maintenance Due  Topic Date Due  . FOOT EXAM  04/12/1976  . OPHTHALMOLOGY EXAM  04/12/1976  . URINE MICROALBUMIN  04/12/1976  . TETANUS/TDAP  04/12/1985     Review of Systems per hpi, frequen urination. Chronic sexual dysfunciton unable to keep erection. Otherwise 10 point ros is negative Physical Exam   BP 150/94 mmHg  Pulse 97  Temp(Src) 98 F (36.7 C) (Oral)  Ht '5\' 9"'  (1.753 m)  Wt 278 lb (126.1 kg)  BMI 41.03 kg/m2  SpO2 91% Physical Exam  Constitutional: He is oriented to person, place, and time. He appears well-developed and well-nourished. No distress.  HENT:  Mouth/Throat: Oropharynx  is clear and moist. No oropharyngeal exudate.  Cardiovascular: Normal rate, regular rhythm and normal heart sounds. Exam reveals no gallop and no friction rub.  No murmur heard.  Pulmonary/Chest: Effort normal and breath sounds normal. No respiratory distress. He has no wheezes.  Abdominal: Soft. Bowel sounds are normal. He exhibits no distension. There is no tenderness.  Lymphadenopathy:  He has no cervical adenopathy.  Neurological: He is alert and oriented to person, place, and time.  Skin: Skin is warm and dry. No rash noted. No erythema.  Psychiatric: He has a normal mood and affect. His behavior is normal.    Lab Results  Component Value Date   CD4TCELL 8* 05/25/2014   Lab Results  Component Value Date   CD4TABS 180* 05/25/2014   CD4TABS 180* 01/23/2014   CD4TABS 120* 10/25/2013   Lab Results  Component Value Date   HIV1RNAQUANT <20 05/25/2014   Lab Results  Component Value Date   HEPBSAB REACTIVE* 12/06/2012   No results found for: RPR  CBC Lab Results  Component Value Date   WBC 6.5 05/25/2014   RBC 5.14 05/25/2014   HGB 14.7 05/25/2014   HCT 44.9 05/25/2014   PLT 184 05/25/2014   MCV 87.4 05/25/2014   MCH 28.6 05/25/2014   MCHC 32.7 05/25/2014   RDW 13.5 05/25/2014   LYMPHSABS 2.0 05/25/2014   MONOABS 0.8 05/25/2014   EOSABS 0.1 05/25/2014   BASOSABS 0.0 05/25/2014   BMET Lab Results  Component Value Date   NA 135 05/25/2014   K 4.1 05/25/2014   CL 100 05/25/2014   CO2 27 05/25/2014   GLUCOSE 123* 05/25/2014   BUN 8 05/25/2014   CREATININE 0.88 05/25/2014   CALCIUM 8.8 05/25/2014   GFRNONAA 88 01/23/2014   GFRAA >89 01/23/2014     Assessment and Plan  Frequent urination including nocturia = will check ua. May need alpha blocker. Will defer to his pcp to manage. He has high level of glucosuria and proteinuria likely from poorly controlled DM. Asked him to see his PCP for better management of DM. Offered to have him see  endocrinology.  Hypertension = will start him on ace inhibitor of lisinopril 44m which would also help proteinuria. May need to titrate up.  Proteinuria = likely due to DM. Will start on lisinopril 170mdaily  Sexual dysfunction = will check testosterone as possible cause. He is on several meds that could interact with viagra/cialis which will need to be taken into consideration  hiv = well controlled, continue on current regimen. Will recheck labs.

## 2014-09-22 LAB — T-HELPER CELL (CD4) - (RCID CLINIC ONLY)
CD4 T CELL HELPER: 8 % — AB (ref 33–55)
CD4 T Cell Abs: 200 /uL — ABNORMAL LOW (ref 400–2700)

## 2014-09-22 LAB — URINALYSIS
Glucose, UA: >1000 mg/dL — AB
Hgb urine dipstick: NEGATIVE
Ketones, ur: NEGATIVE mg/dL
Leukocytes, UA: NEGATIVE
Nitrite: NEGATIVE
PH: 6 (ref 5.0–8.0)
PROTEIN: 30 mg/dL — AB
Specific Gravity, Urine: >1.03 — ABNORMAL HIGH (ref 1.005–1.030)
Urobilinogen, UA: 1 mg/dL (ref 0.0–1.0)

## 2014-09-22 LAB — TESTOSTERONE, FREE, TOTAL, SHBG
SEX HORMONE BINDING: 42 nmol/L (ref 10–50)
TESTOSTERONE: 317 ng/dL (ref 300–890)
Testosterone, Free: 53.8 pg/mL (ref 47.0–244.0)
Testosterone-% Free: 1.7 % (ref 1.6–2.9)

## 2014-09-23 LAB — HIV-1 RNA QUANT-NO REFLEX-BLD
HIV 1 RNA Quant: <20 copies/mL (ref <20)
HIV-1 RNA Quant, Log: <1.3 {Log} (ref <1.30)

## 2014-09-24 MED ORDER — LISINOPRIL 10 MG PO TABS: 10.0000 mg | ORAL_TABLET | Freq: Every day | ORAL | Status: DC

## 2014-09-25 ENCOUNTER — Ambulatory Visit (HOSPITAL_BASED_OUTPATIENT_CLINIC_OR_DEPARTMENT_OTHER): Payer: Medicaid Other | Attending: Internal Medicine

## 2014-09-25 IMAGING — CR DG CHEST 2V
2 series · 2 of 2 positions shown · non-contrast
Comparison: 05/20/2013.

CLINICAL DATA: Back pain and shortness of breath.  Cough.

EXAM:
CHEST  2 VIEW

[w chest pa]
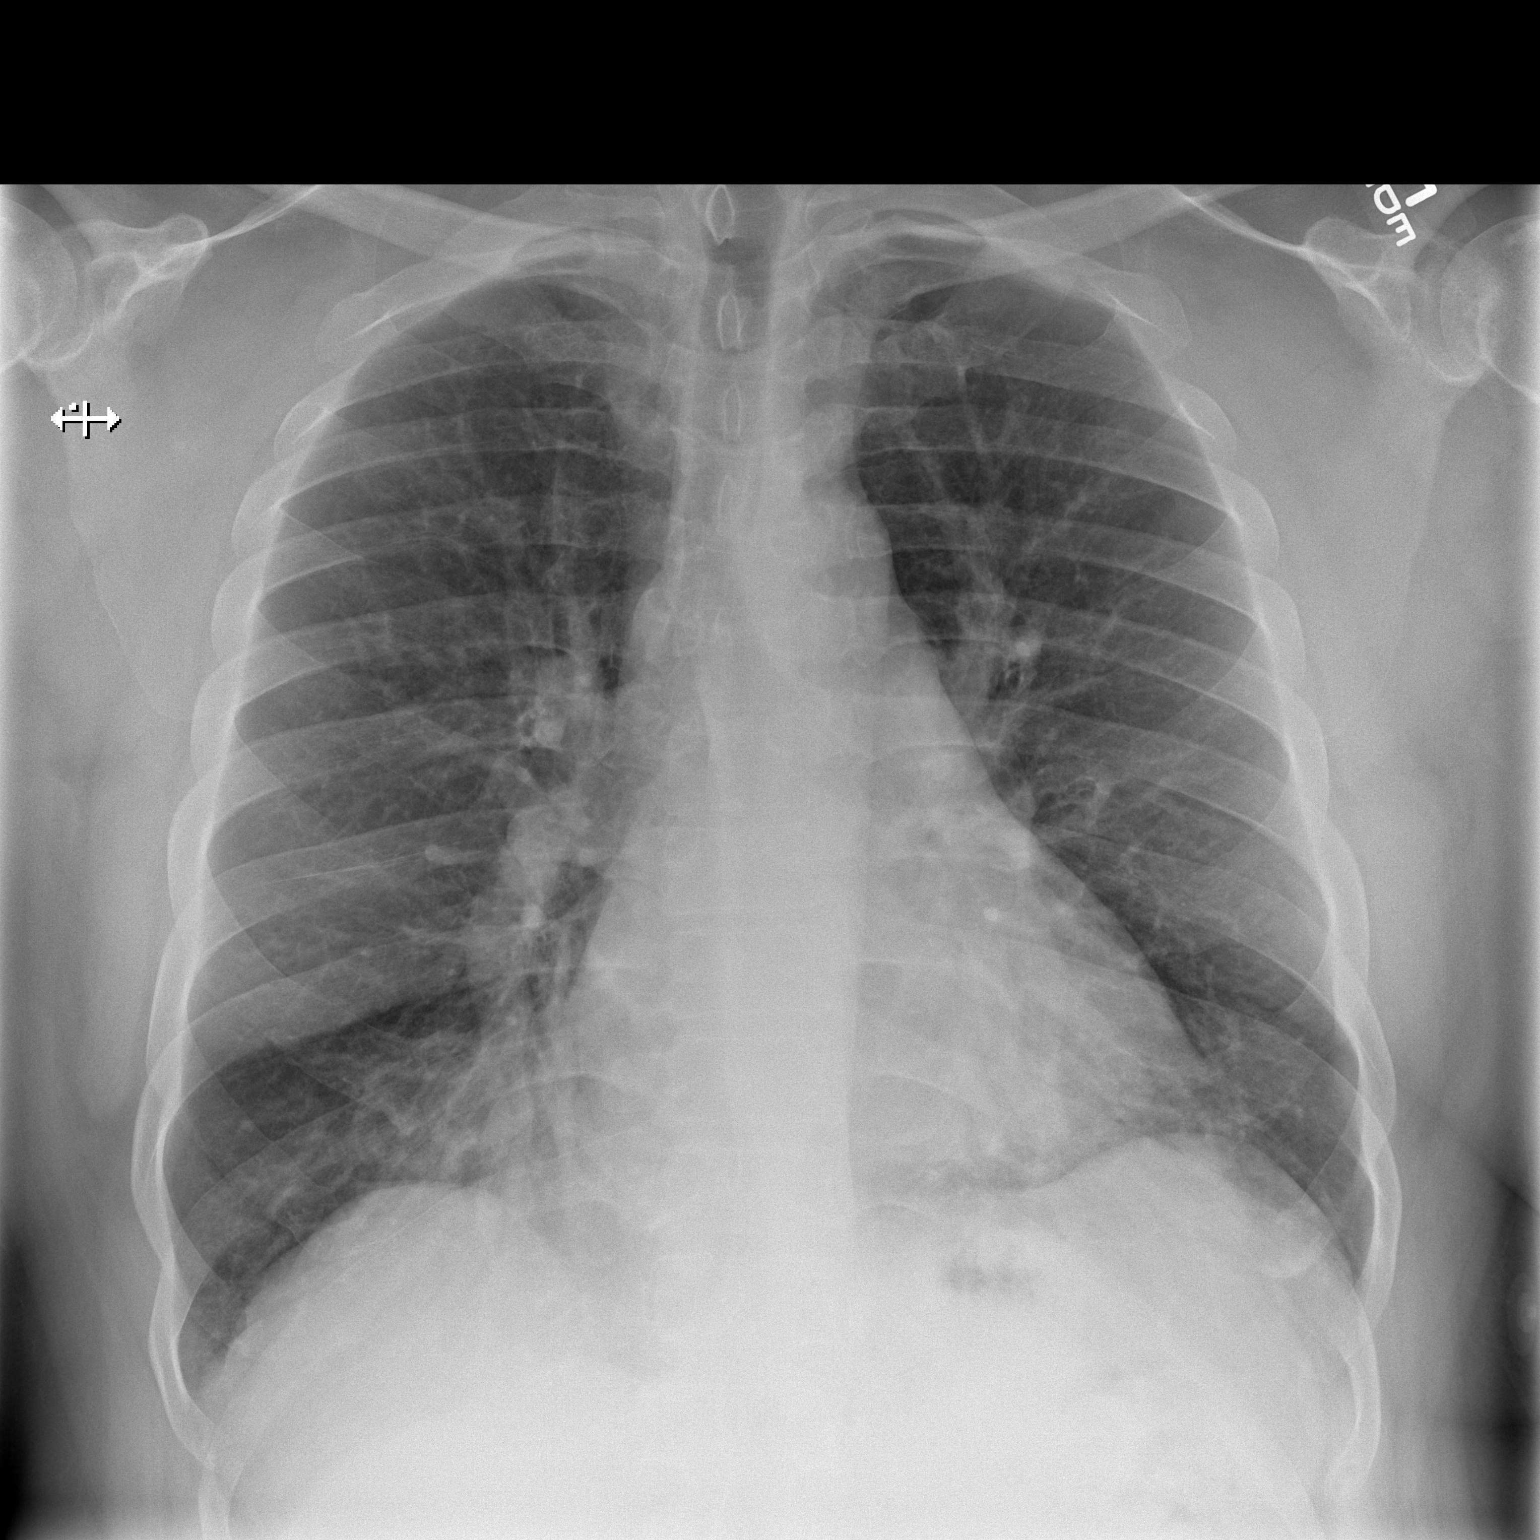

[w chest lat]
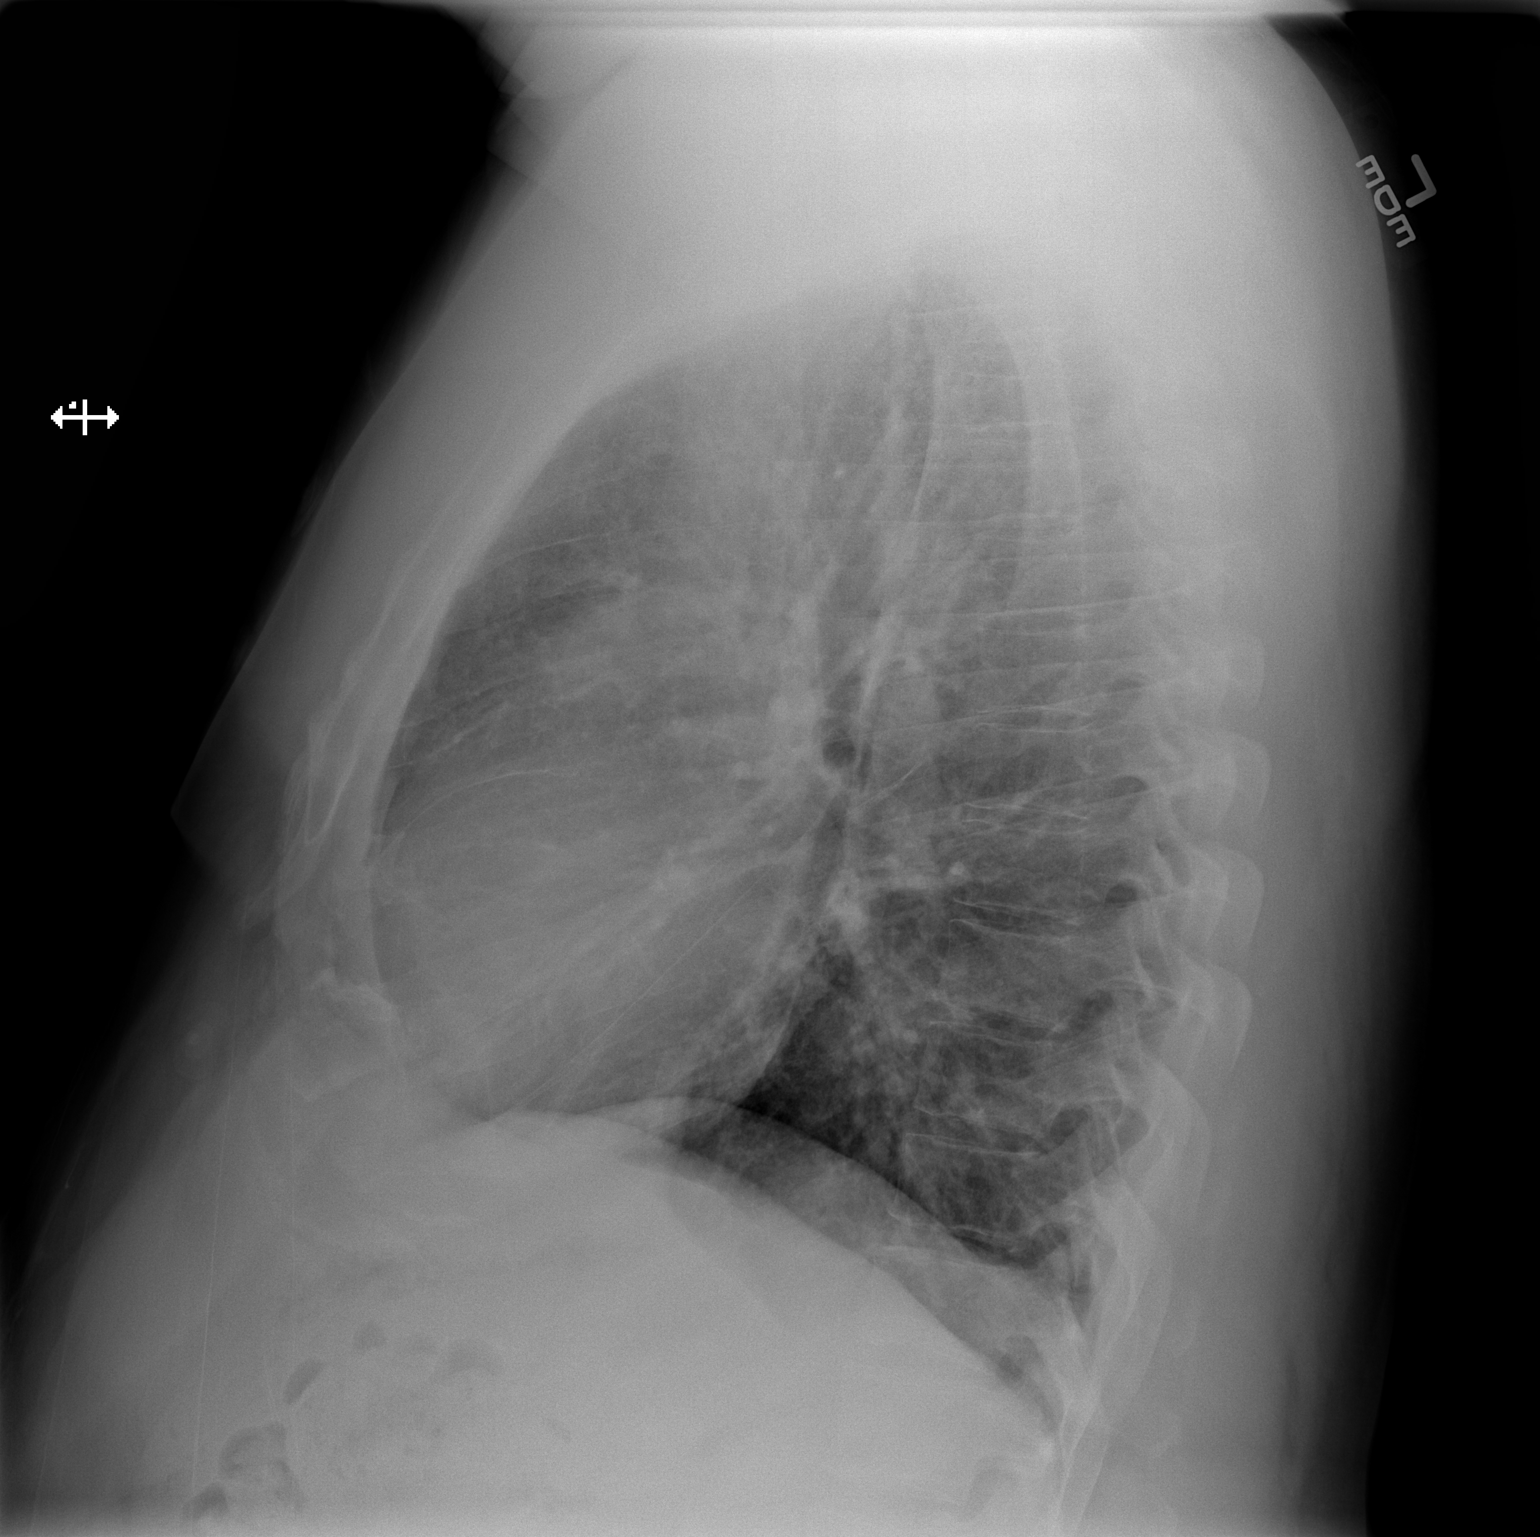

[2 of 2 positions shown; findings below may reference images not displayed]

FINDINGS: Trachea is midline. Heart size stable. Probable scarring in the
medial aspects of both lower lobes, unchanged. Lungs are otherwise
clear. No pleural fluid. Left hemidiaphragm is mildly elevated.
IMPRESSION: No acute findings.

## 2014-09-25 IMAGING — CT CT ANGIO CHEST
2 of 8 series · 19 of 46 positions shown · IV contrast (Omni 300)
Comparison: None.

CLINICAL DATA: Short of breath, COPD, asthma

EXAM:
CT ANGIOGRAPHY CHEST WITH CONTRAST
TECHNIQUE: Multidetector CT imaging of the chest was performed using the
standard protocol during bolus administration of intravenous
contrast. Multiplanar CT image reconstructions and MIPs were
obtained to evaluate the vascular anatomy.
CONTRAST:  100mL OMNIPAQUE IOHEXOL 350 MG/ML SOLN

[Series 6: thins · axial · 0.73mm/px · z∈[+1096,+1360]mm · 16 of 292 slices shown]
[im 14/292  lung]
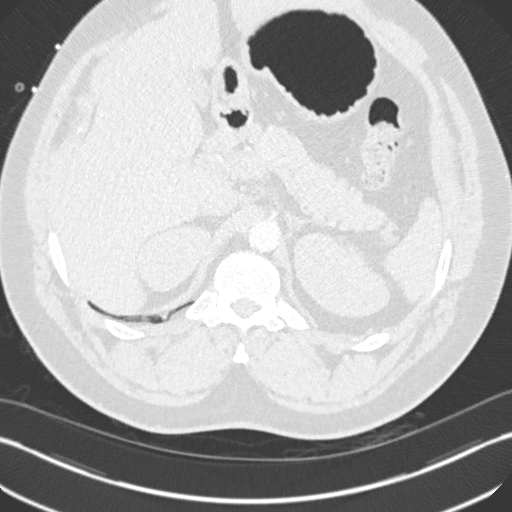
[im 27/292  soft-tissue]
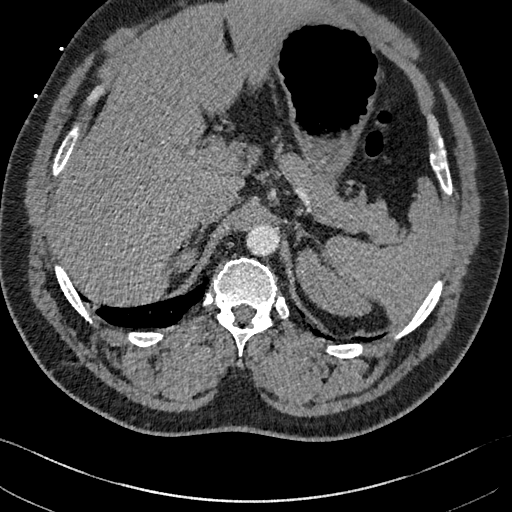
[im 53/292  lung]
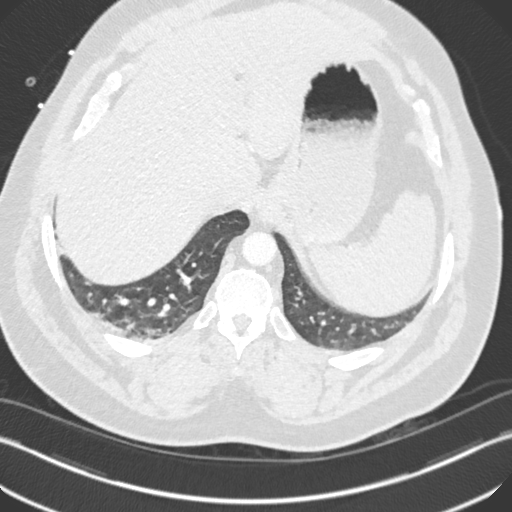
[im 67/292  soft-tissue]
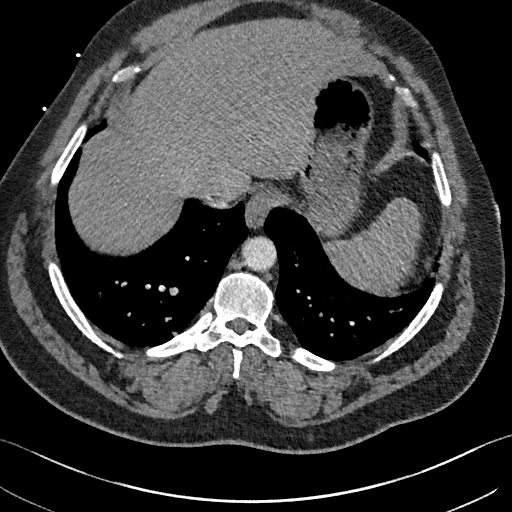
[im 80/292  lung]
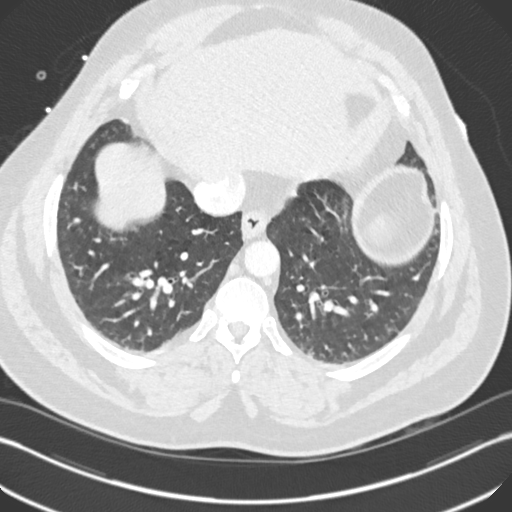
[im 106/292  soft-tissue]
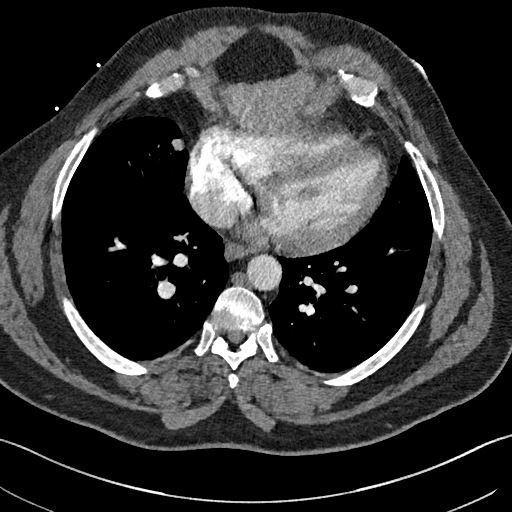
[im 120/292  lung]
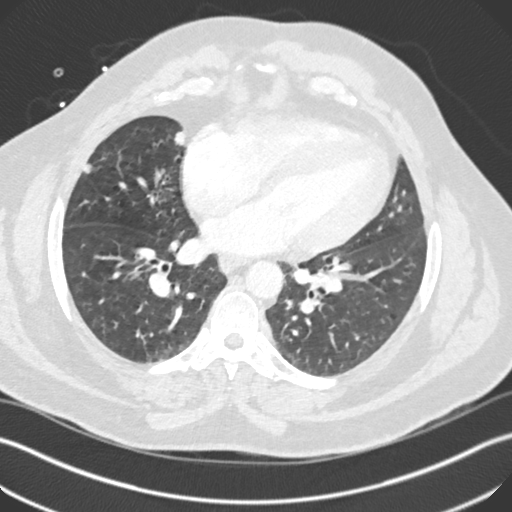
[im 133/292  soft-tissue]
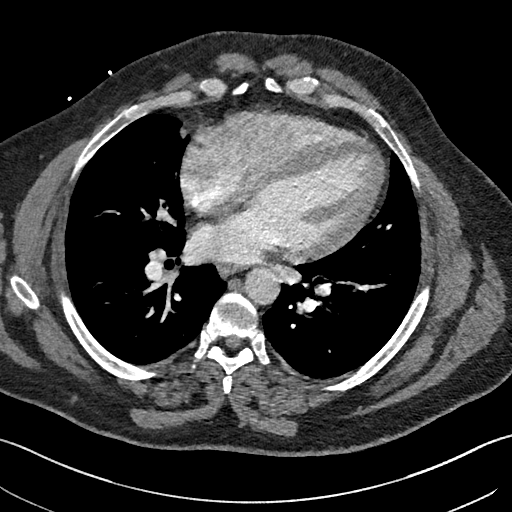
[im 159/292  lung]
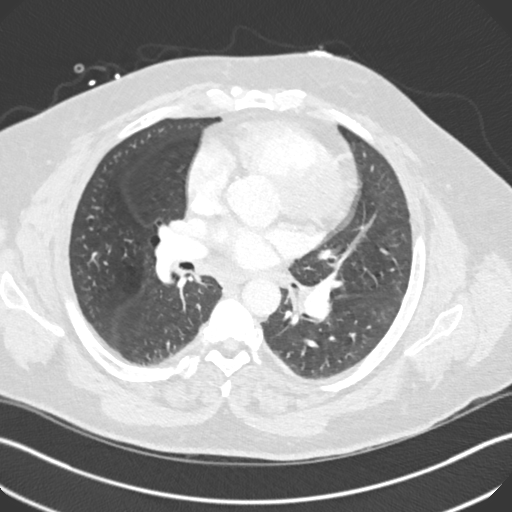
[im 172/292  soft-tissue]
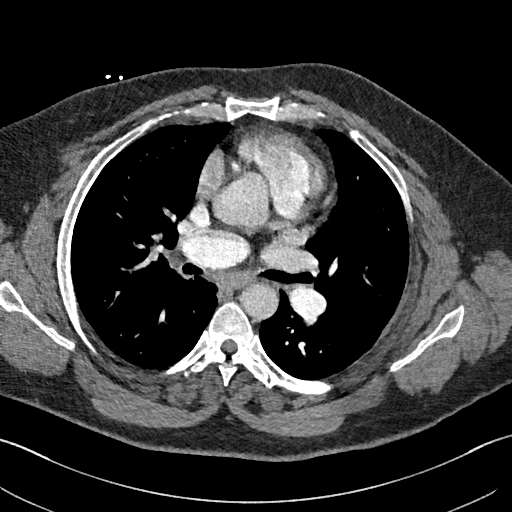
[im 186/292  lung]
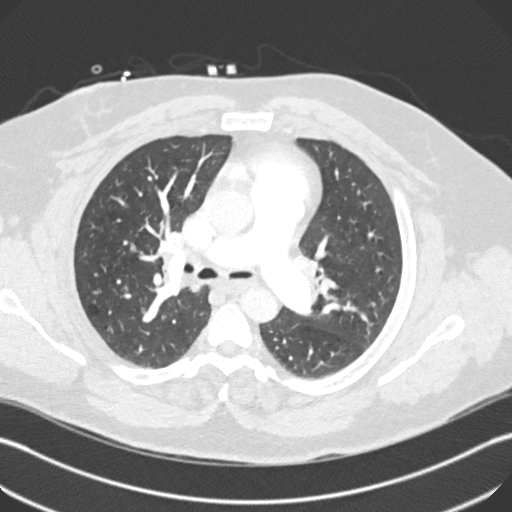
[im 212/292  soft-tissue]
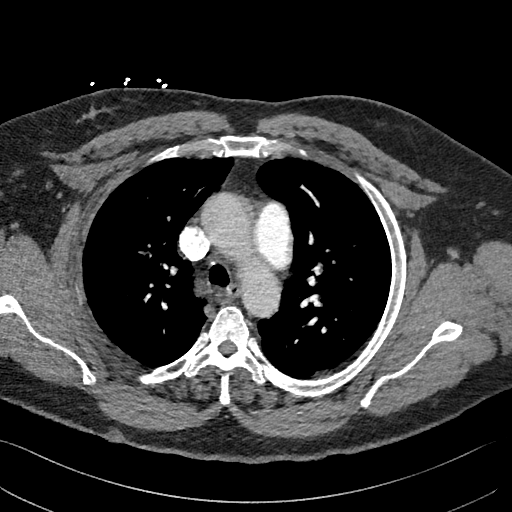
[im 225/292  lung]
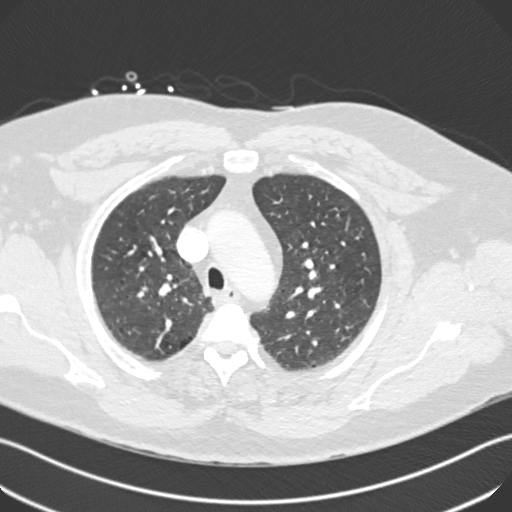
[im 239/292  soft-tissue]
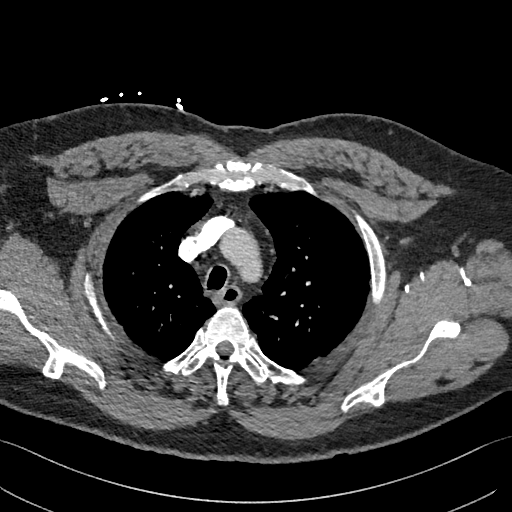
[im 265/292  lung]
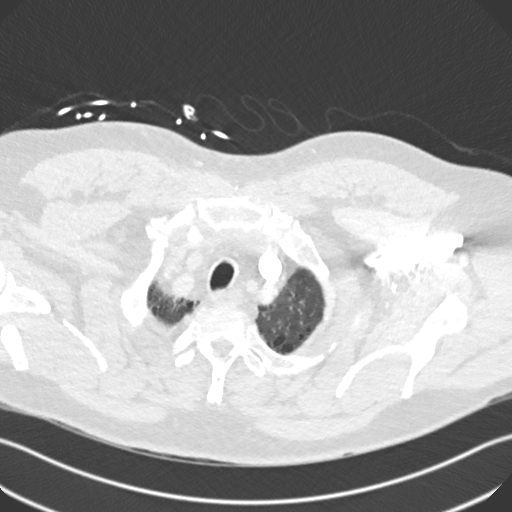
[im 278/292  soft-tissue]
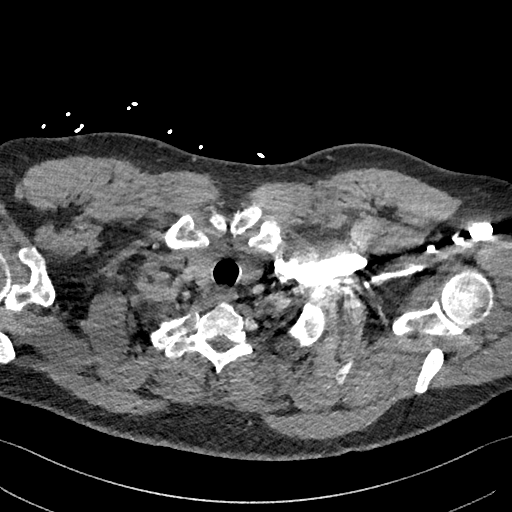

[Series 8: coronal mpr · coronal · 0.59mm/px · 3 of 120 slices shown]
[im 30/120  soft-tissue]
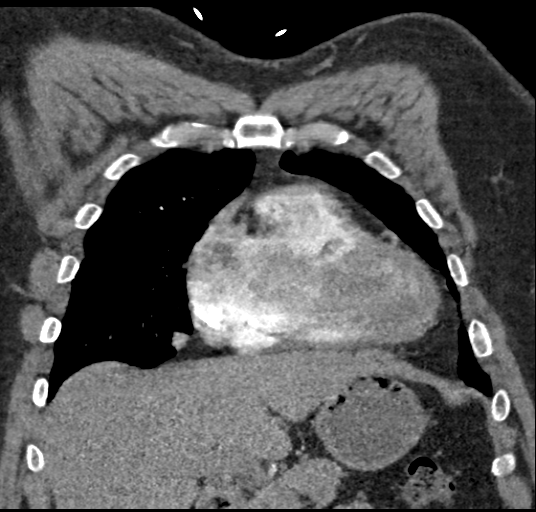
[im 60/120  soft-tissue]
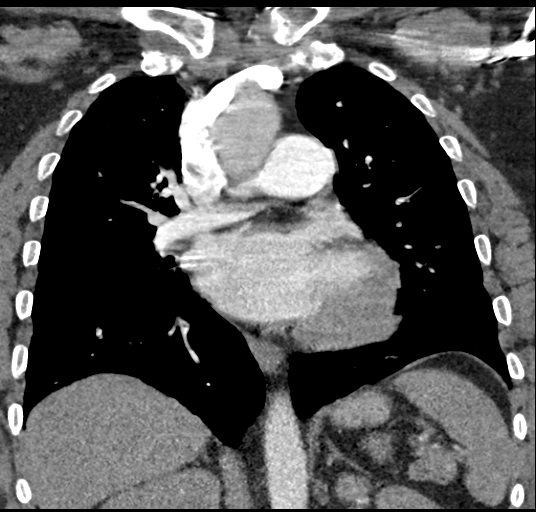
[im 90/120  soft-tissue]
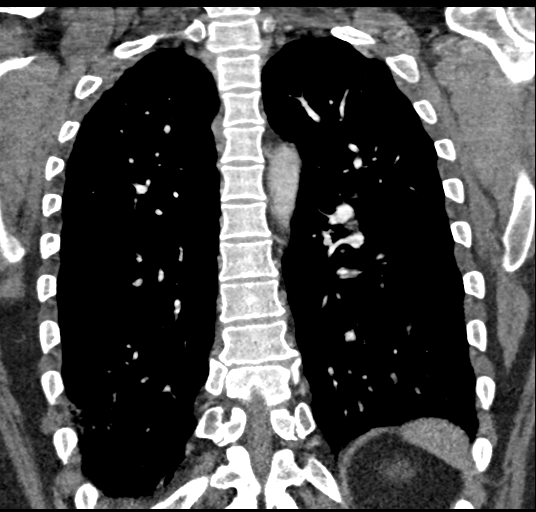

[19 of 46 positions shown; findings below may reference images not displayed]

FINDINGS: Satisfactory opacification of pulmonary arteries noted, and there is
no evidence of pulmonary emboli. Adequate contrast opacification of
the thoracic aorta with no evidence of dissection, aneurysm, or
stenosis. There is no pleural or pericardial effusion. No hilar or
mediastinal adenopathy. Small subpleural blebs in both upper lobes.
Scarring or subsegmental atelectasis anteromedially in the right
middle lobe. 4 mm pleural-based right middle lobe peripheral nodule
image 58/7. Lungs otherwise clear. Brachiocephalic arch anatomy
without proximal stenosis. Thoracic spine and sternum intact.
Visualized portions of upper abdomen unremarkable.

Review of the MIP images confirms the above findings.
IMPRESSION: 1. Negative for acute PE or thoracic aortic dissection.
2. 4 mm right middle lobe pleural-based nodule. Given risk factors
for bronchogenic carcinoma, follow-up chest CT at 1 year is
recommended. This recommendation follows the consensus statement:
"Guidelines for Management of Small Pulmonary Nodules Detected on CT
Scans: A Statement from the [HOSPITAL]" as published in
[URL]

## 2014-09-26 ENCOUNTER — Telehealth: Payer: Self-pay | Admitting: *Deleted

## 2014-09-26 NOTE — Telephone Encounter (Signed)
-----   Message from Judyann Munsonynthia Snider, MD sent at 09/24/2014  5:06 PM EST ----- Can you let him know that his testosterone level looks good. His diabetes looks like he needs improvement. Please have him see his pcp or if he wants us to refer him to diabetes specialist.

## 2014-09-26 NOTE — Telephone Encounter (Signed)
Shared Dr. Feliz BeamSnider's message.  Pt seeing PCP 09/28/14.

## 2014-09-26 NOTE — Telephone Encounter (Signed)
Per Dr Drue SecondSnider called the patient and had to leave a message for him to call the office back about his lab results.

## 2014-09-28 ENCOUNTER — Ambulatory Visit: Payer: Medicaid Other | Attending: Internal Medicine | Admitting: Internal Medicine

## 2014-09-28 ENCOUNTER — Encounter: Payer: Self-pay | Admitting: Internal Medicine

## 2014-09-28 VITALS — BP 112/68 | HR 91 | Temp 98.3°F | Resp 16 | Ht 69.0 in | Wt 277.0 lb

## 2014-09-28 DIAGNOSIS — F17210 Nicotine dependence, cigarettes, uncomplicated: Secondary | ICD-10-CM | POA: Insufficient documentation

## 2014-09-28 DIAGNOSIS — Z9981 Dependence on supplemental oxygen: Secondary | ICD-10-CM | POA: Insufficient documentation

## 2014-09-28 DIAGNOSIS — E119 Type 2 diabetes mellitus without complications: Secondary | ICD-10-CM | POA: Diagnosis not present

## 2014-09-28 DIAGNOSIS — G4733 Obstructive sleep apnea (adult) (pediatric): Secondary | ICD-10-CM | POA: Diagnosis not present

## 2014-09-28 DIAGNOSIS — Z7951 Long term (current) use of inhaled steroids: Secondary | ICD-10-CM | POA: Diagnosis not present

## 2014-09-28 DIAGNOSIS — B20 Human immunodeficiency virus [HIV] disease: Secondary | ICD-10-CM | POA: Diagnosis not present

## 2014-09-28 DIAGNOSIS — Z792 Long term (current) use of antibiotics: Secondary | ICD-10-CM | POA: Insufficient documentation

## 2014-09-28 DIAGNOSIS — I10 Essential (primary) hypertension: Secondary | ICD-10-CM | POA: Diagnosis not present

## 2014-09-28 DIAGNOSIS — J449 Chronic obstructive pulmonary disease, unspecified: Secondary | ICD-10-CM | POA: Diagnosis not present

## 2014-09-28 DIAGNOSIS — Z9114 Patient's other noncompliance with medication regimen: Secondary | ICD-10-CM | POA: Diagnosis not present

## 2014-09-28 DIAGNOSIS — Z794 Long term (current) use of insulin: Secondary | ICD-10-CM | POA: Diagnosis not present

## 2014-09-28 LAB — GLUCOSE, POCT (MANUAL RESULT ENTRY): POC Glucose: 204 mg/dl — AB (ref 70–99)

## 2014-09-28 LAB — POCT GLYCOSYLATED HEMOGLOBIN (HGB A1C): HEMOGLOBIN A1C: 8

## 2014-09-28 MED ORDER — INSULIN GLARGINE 100 UNIT/ML ~~LOC~~ SOLN: 10.0000 [IU] | Freq: Every day | SUBCUTANEOUS | Status: DC

## 2014-09-28 MED ORDER — METFORMIN HCL 500 MG PO TABS: 500.0000 mg | ORAL_TABLET | Freq: Two times a day (BID) | ORAL | Status: DC

## 2014-09-28 MED ORDER — ACETAMINOPHEN-CODEINE #3 300-30 MG PO TABS
1.0000 | ORAL_TABLET | ORAL | Status: DC | PRN
Start: 2014-09-28 — End: 2014-12-04

## 2014-09-28 NOTE — Patient Instructions (Signed)
Basic Carbohydrate Counting for Diabetes Mellitus Carbohydrate counting is a method for keeping track of the amount of carbohydrates you eat. Eating carbohydrates naturally increases the level of sugar (glucose) in your blood, so it is important for you to know the amount that is okay for you to have in every meal. Carbohydrate counting helps keep the level of glucose in your blood within normal limits. The amount of carbohydrates allowed is different for every person. A dietitian can help you calculate the amount that is right for you. Once you know the amount of carbohydrates you can have, you can count the carbohydrates in the foods you want to eat. Carbohydrates are found in the following foods:  Grains, such as breads and cereals.  Dried beans and soy products.  Starchy vegetables, such as potatoes, peas, and corn.  Fruit and fruit juices.  Milk and yogurt.  Sweets and snack foods, such as cake, cookies, candy, chips, soft drinks, and fruit drinks. CARBOHYDRATE COUNTING There are two ways to count the carbohydrates in your food. You can use either of the methods or a combination of both. Reading the "Nutrition Facts" on Packaged Food The "Nutrition Facts" is an area that is included on the labels of almost all packaged food and beverages in the [place]. It includes the serving size of that food or beverage and information about the nutrients in each serving of the food, including the grams (g) of carbohydrate per serving.  Decide the number of servings of this food or beverage that you will be able to eat or drink. Multiply that number of servings by the number of grams of carbohydrate that is listed on the label for that serving. The total will be the amount of carbohydrates you will be having when you eat or drink this food or beverage. Learning Standard Serving Sizes of Food When you eat food that is not packaged or does not include "Nutrition Facts" on the label, you need to  measure the servings in order to count the amount of carbohydrates.A serving of most carbohydrate-rich foods contains about 15 g of carbohydrates. The following list includes serving sizes of carbohydrate-rich foods that provide 15 g ofcarbohydrate per serving:   1 slice of bread (1 oz) or 1 six-inch tortilla.    of a hamburger bun or English muffin.  4-6 crackers.   cup unsweetened dry cereal.    cup hot cereal.   cup rice or pasta.    cup mashed potatoes or  of a large baked potato.  1 cup fresh fruit or one small piece of fruit.    cup canned or frozen fruit or fruit juice.  1 cup milk.   cup plain fat-free yogurt or yogurt sweetened with artificial sweeteners.   cup cooked dried beans or starchy vegetable, such as peas, corn, or potatoes.  Decide the number of standard-size servings that you will eat. Multiply that number of servings by 15 (the grams of carbohydrates in that serving). For example, if you eat 2 cups of strawberries, you will have eaten 2 servings and 30 g of carbohydrates (2 servings x 15 g = 30 g). For foods such as soups and casseroles, in which more than one food is mixed in, you will need to count the carbohydrates in each food that is included. EXAMPLE OF CARBOHYDRATE COUNTING Sample Dinner  3 oz chicken breast.   cup of brown rice.   cup of corn.  1 cup milk.   1 cup strawberries with   sugar-free whipped topping.  Carbohydrate Calculation Step 1: Identify the foods that contain carbohydrates:   Rice.   Corn.   Milk.   Strawberries. Step 2:Calculate the number of servings eaten of each:   2 servings of rice.   1 serving of corn.   1 serving of milk.   1 serving of strawberries. Step 3: Multiply each of those number of servings by 15 g:   2 servings of rice x 15 g = 30 g.   1 serving of corn x 15 g = 15 g.   1 serving of milk x 15 g = 15 g.   1 serving of strawberries x 15 g = 15 g. Step 4: Add  together all of the amounts to find the total grams of carbohydrates eaten: 30 g + 15 g + 15 g + 15 g = 75 g. Document Released: 07/28/2005 Document Revised: 12/12/2013 Document Reviewed: 06/24/2013 ExitCare Patient Information 2015 [employer]. This information is not intended to replace advice given to you by your health care provider. Make sure you discuss any questions you have with your health care provider. Diabetes and Exercise Exercising regularly is important. It is not just about losing weight. It has many health benefits, such as:  Improving your overall fitness, flexibility, and endurance.  Increasing your bone density.  Helping with weight control.  Decreasing your body fat.  Increasing your muscle strength.  Reducing stress and tension.  Improving your overall health. People with diabetes who exercise gain additional benefits because exercise:  Reduces appetite.  Improves the body's use of blood sugar (glucose).  Helps lower or control blood glucose.  Decreases blood pressure.  Helps control blood lipids (such as cholesterol and triglycerides).  Improves the body's use of the hormone insulin by:  Increasing the body's insulin sensitivity.  Reducing the body's insulin needs.  Decreases the risk for heart disease because exercising:  Lowers cholesterol and triglycerides levels.  Increases the levels of good cholesterol (such as high-density lipoproteins [HDL]) in the body.  Lowers blood glucose levels. YOUR ACTIVITY PLAN  Choose an activity that you enjoy and set realistic goals. Your health care provider or diabetes educator can help you make an activity plan that works for you. Exercise regularly as directed by your health care provider. This includes:  Performing resistance training twice a week such as push-ups, sit-ups, lifting weights, or using resistance bands.  Performing 150 minutes of cardio exercises each week such as walking, running, or  playing sports.  Staying active and spending no more than 90 minutes at one time being inactive. Even short bursts of exercise are good for you. Three 10-minute sessions spread throughout the day are just as beneficial as a single 30-minute session. Some exercise ideas include:  Taking the dog for a walk.  Taking the stairs instead of the elevator.  Dancing to your favorite song.  Doing an exercise video.  Doing your favorite exercise with a friend. RECOMMENDATIONS FOR EXERCISING WITH TYPE 1 OR TYPE 2 DIABETES   Check your blood glucose before exercising. If blood glucose levels are greater than 240 mg/dL, check for urine ketones. Do not exercise if ketones are present.  Avoid injecting insulin into areas of the body that are going to be exercised. For example, avoid injecting insulin into:  The arms when playing tennis.  The legs when jogging.  Keep a record of:  Food intake before and after you exercise.  Expected peak times of insulin action.  Blood   glucose levels before and after you exercise.  The type and amount of exercise you have done.  Review your records with your health care provider. Your health care provider will help you to develop guidelines for adjusting food intake and insulin amounts before and after exercising.  If you take insulin or oral hypoglycemic agents, watch for signs and symptoms of hypoglycemia. They include:  Dizziness.  Shaking.  Sweating.  Chills.  Confusion.  Drink plenty of water while you exercise to prevent dehydration or heat stroke. Body water is lost during exercise and must be replaced.  Talk to your health care provider before starting an exercise program to make sure it is safe for you. Remember, almost any type of activity is better than none. Document Released: 10/18/2003 Document Revised: 12/12/2013 Document Reviewed: 01/04/2013 ExitCare Patient Information 2015 [employer]. This information is not intended to  replace advice given to you by your health care provider. Make sure you discuss any questions you have with your health care provider.  

## 2014-09-28 NOTE — Progress Notes (Signed)
Pt comes in for follow up DM,HTN,COPD Pt c/o frequent urination. Admits he doesn't take Lantus everyday Pt is on 2liters [place] oxygen at home Sob noted Denies chest pain or dizziness CBG- A1c-

## 2014-09-28 NOTE — Progress Notes (Addendum)
Patient ID: Chris [name], male   DOB: April 26, 1966, 49 y.o.   MRN: [id number]   Chris [name], is a 49 y.o. male  VFI:[id number]  [id number]  DOB - [birth year]  Chief Complaint  Patient presents with  . Follow-up  . Hypertension  . Diabetes        Subjective:   Chris [name] a 49 y.o. male here today for a follow up visit. Patient has history of HIV disease, diabetes mellitus, morbid obesity, hypertension, COPD on home oxygen here today for routine follow-up. Patient has not been compliant with medications including insulin, he claims that he doesn't do the injection every day. He has no significant complaint today is her for increasing urination and thirst. He continues to have shortness of breath, he continues to smoke cigarettes despite several counseling in the past especially now that patient is on oxygen. He denies chest pain or dizziness. Patient has No headache, No chest pain, No abdominal pain - No Nausea, No new weakness tingling or numbness, No Cough - SOB.  No problems updated.  ALLERGIES: Allergies  Allergen Reactions  . Bactrim [Sulfamethoxazole-Trimethoprim] Hives    PAST MEDICAL HISTORY: Past Medical History  Diagnosis Date  . COPD (chronic obstructive pulmonary disease)   . HIV disease   . Emphysema   . Diabetes mellitus without complication     MEDICATIONS AT HOME: Prior to Admission medications   Medication Sig Start Date End Date Taking? Authorizing Provider  acetaminophen-codeine (TYLENOL #3) 300-30 MG per tablet Take 1 tablet by mouth every 4 (four) hours as needed. 09/28/14  Yes Tresa Garter, MD  albuterol (PROVENTIL HFA;VENTOLIN HFA) 108 (90 BASE) MCG/ACT inhaler Inhale 2 puffs into the lungs every 4 (four) hours as needed for wheezing or shortness of breath (((PLAN B))). 04/20/14  Yes Cowan Pilar E Doreene Burke, MD  albuterol (PROVENTIL) (5 MG/ML) 0.5% nebulizer solution Take 0.5 mLs (2.5 mg total) by nebulization every 4 (four) hours as needed for wheezing  or shortness of breath (((PLAN C))). 04/28/14  Yes Tresa Garter, MD  budesonide-formoterol (SYMBICORT) 160-4.5 MCG/ACT inhaler Inhale 2 puffs into the lungs 2 (two) times daily. 06/12/14  Yes Tresa Garter, MD  dapsone 100 MG tablet Take 1 tablet (100 mg total) by mouth daily. 02/08/14  Yes Carlyle Basques, MD  Darunavir Ethanolate (PREZISTA) 800 MG tablet Take 1 tablet (800 mg total) by mouth daily with breakfast. 06/20/14  Yes Carlyle Basques, MD  elvitegravir-cobicistat-emtricitabine-tenofovir (STRIBILD) 150-150-200-300 MG TABS tablet Take 1 tablet by mouth daily with breakfast. 06/20/14  Yes Carlyle Basques, MD  insulin aspart (NOVOLOG) 100 UNIT/ML injection Inject 0-20 Units into the skin 3 (three) times daily with meals. CBG 70 - 120: 0 units CBG 121 - 150: 3 units CBG 151 - 200: 4 units CBG 201 - 250: 7 units CBG 251 - 300: 11 units CBG 301 - 350: 15 units CBG 351 - 400: 20 units 01/17/14  Yes Gerrica Cygan E Dinia Joynt, MD  insulin glargine (LANTUS) 100 UNIT/ML injection Inject 0.1 mLs (10 Units total) into the skin at bedtime. 09/28/14  Yes Tresa Garter, MD  lisinopril (PRINIVIL) 10 MG tablet Take 1 tablet (10 mg total) by mouth daily. 09/24/14  Yes Carlyle Basques, MD  Tiotropium Bromide Monohydrate (SPIRIVA RESPIMAT) 2.5 MCG/ACT AERS 2 puffs each am 09/20/14  Yes Tresa Garter, MD  Blood Glucose Monitoring Suppl (ACCU-CHEK AVIVA PLUS) W/DEVICE KIT 250 Test up to 4 times daily 04/28/14   Tresa Garter, MD  dextromethorphan-guaiFENesin [facility]  DM) 30-600 MG per 12 hr tablet Take 1 tablet by mouth every 12 (twelve) hours as needed for cough.    Historical Provider, MD  fluconazole (DIFLUCAN) 200 MG tablet Take 1 tablet (200 mg total) by mouth once. Take 1 tablet (200 mg total) by mouth daily for 7 days Patient not taking: Reported on 09/28/2014 08/15/14   Tresa Garter, MD  glucose blood test strip Use as instructed 04/20/14   Tresa Garter, MD  Insulin Pen Needle 32G  X 5 MM MISC Use as directed 06/12/14   Tresa Garter, MD  metFORMIN (GLUCOPHAGE) 500 MG tablet Take 1 tablet (500 mg total) by mouth 2 (two) times daily with a meal. 09/28/14   Tresa Garter, MD  predniSONE (DELTASONE) 10 MG tablet Take  4 each am x 2 days,   2 each am x 2 days,  1 each am x 2 days and stop Patient not taking: Reported on 08/15/2014 04/20/14   Tresa Garter, MD     Objective:   Filed Vitals:   09/28/14 1500  BP: 112/68  Pulse: 91  Temp: 98.3 F (36.8 C)  TempSrc: Oral  Resp: 16  Height: '5\' 9"'  (1.753 m)  Weight: 277 lb (125.646 kg)  SpO2: 92%    Exam General appearance : Awake, alert, not in any distress. Speech Clear. Not toxic looking, morbidly obese, oxygen by nasal cannula in situ HEENT: Atraumatic and Normocephalic, pupils equally reactive to light and accomodation Neck: supple, no JVD. No cervical lymphadenopathy.  Chest:Good air entry bilaterally, no added sounds  CVS: S1 S2 regular, no murmurs.  Abdomen: Bowel sounds present, Non tender and not distended with no gaurding, rigidity or rebound. Extremities: B/L Lower Ext shows no edema, both legs are warm to touch Neurology: Awake alert, and oriented X 3, CN II-XII intact, Non focal Skin:No Rash Wounds:N/A  Data Review Lab Results  Component Value Date   HGBA1C 8.0 09/28/2014   HGBA1C 6.5 04/20/2014   HGBA1C 8.5* 12/12/2013     Assessment & Plan   1. Type 2 diabetes mellitus without complication  - Glucose (CBG) - HgB A1c is 8.0%  Add - metFORMIN (GLUCOPHAGE) 500 MG tablet; Take 1 tablet (500 mg total) by mouth 2 (two) times daily with a meal.  Dispense: 180 tablet; Refill: 3 - insulin glargine (LANTUS) 100 UNIT/ML injection; Inject 0.1 mLs (10 Units total) into the skin at bedtime.  Dispense: 10 mL; Refill: 11  - Ambulatory referral to Podiatry - Ambulatory referral to Ophthalmology  2. COPD mixed type  - acetaminophen-codeine (TYLENOL #3) 300-30 MG per tablet; Take 1  tablet by mouth every 4 (four) hours as needed.  Dispense: 90 tablet; Refill: 0 - Continue home oxygen  3. OSA (obstructive sleep apnea)   4. HIV disease Continue current medication Follow-up with infectious disease  Patient was extensively counseled on nutrition and exercise Patient was again counseled extensively on smoking cessation  Return in about 3 months (around 12/27/2014), or if symptoms worsen or fail to improve, for Hemoglobin A1C and Follow up, DM, Follow up HTN.  The patient was given clear instructions to go to ER or return to medical center if symptoms don't improve, worsen or new problems develop. The patient verbalized understanding. The patient was told to call to get lab results if they haven't heard anything in the next week.   This note has been created with Surveyor, quantity. Any transcriptional errors are  unintentional.    Angelica Chessman, MD, [place], [place], [place] [facility] [place], [place]   09/28/2014, 4:15 PM

## 2014-10-18 ENCOUNTER — Encounter: Payer: Self-pay | Admitting: Podiatry

## 2014-10-18 ENCOUNTER — Ambulatory Visit (INDEPENDENT_AMBULATORY_CARE_PROVIDER_SITE_OTHER): Payer: Medicaid Other | Admitting: Podiatry

## 2014-10-18 VITALS — BP 102/60 | HR 108 | Resp 12

## 2014-10-18 DIAGNOSIS — M79676 Pain in unspecified toe(s): Secondary | ICD-10-CM | POA: Diagnosis not present

## 2014-10-18 DIAGNOSIS — B351 Tinea unguium: Secondary | ICD-10-CM

## 2014-10-18 NOTE — Patient Instructions (Signed)
Apply all-purpose skin lotion (cocoa butter ) at least twice a day At night apply skin lotion, cover with kitchen Saran wrap and wear socks, repeat nightly until skin dryness improves Purchase a callus file or pumice stone to massage scales and callus of feet after showering before applying skin lotion  Diabetes and Foot Care Diabetes may cause you to have problems because of poor blood supply (circulation) to your feet and legs. This may cause the skin on your feet to become thinner, break easier, and heal more slowly. Your skin may become dry, and the skin may peel and crack. You may also have nerve damage in your legs and feet causing decreased feeling in them. You may not notice minor injuries to your feet that could lead to infections or more serious problems. Taking care of your feet is one of the most important things you can do for yourself.  HOME CARE INSTRUCTIONS  Wear shoes at all times, even in the house. Do not go barefoot. Bare feet are easily injured.  Check your feet daily for blisters, cuts, and redness. If you cannot see the bottom of your feet, use a mirror or ask someone for help.  Wash your feet with warm water (do not use hot water) and mild soap. Then pat your feet and the areas between your toes until they are completely dry. Do not soak your feet as this can dry your skin.  Apply a moisturizing lotion or petroleum jelly (that does not contain alcohol and is unscented) to the skin on your feet and to dry, brittle toenails. Do not apply lotion between your toes.  Trim your toenails straight across. Do not dig under them or around the cuticle. File the edges of your nails with an emery board or nail file.  Do not cut corns or calluses or try to remove them with medicine.  Wear clean socks or stockings every day. Make sure they are not too tight. Do not wear knee-high stockings since they may decrease blood flow to your legs.  Wear shoes that fit properly and have enough  cushioning. To break in new shoes, wear them for just a few hours a day. This prevents you from injuring your feet. Always look in your shoes before you put them on to be sure there are no objects inside.  Do not cross your legs. This may decrease the blood flow to your feet.  If you find a minor scrape, cut, or break in the skin on your feet, keep it and the skin around it clean and dry. These areas may be cleansed with mild soap and water. Do not cleanse the area with peroxide, alcohol, or iodine.  When you remove an adhesive bandage, be sure not to damage the skin around it.  If you have a wound, look at it several times a day to make sure it is healing.  Do not use heating pads or hot water bottles. They may burn your skin. If you have lost feeling in your feet or legs, you may not know it is happening until it is too late.  Make sure your health care provider performs a complete foot exam at least annually or more often if you have foot problems. Report any cuts, sores, or bruises to your health care provider immediately. SEEK MEDICAL CARE IF:   You have an injury that is not healing.  You have cuts or breaks in the skin.  You have an ingrown nail.  You notice  redness on your legs or feet.  You feel burning or tingling in your legs or feet.  You have pain or cramps in your legs and feet.  Your legs or feet are numb.  Your feet always feel cold. SEEK IMMEDIATE MEDICAL CARE IF:   There is increasing redness, swelling, or pain in or around a wound.  There is a red line that goes up your leg.  Pus is coming from a wound.  You develop a fever or as directed by your health care provider.  You notice a bad smell coming from an ulcer or wound. Document Released: 07/25/2000 Document Revised: 03/30/2013 Document Reviewed: 01/04/2013 [facility] Patient Information 2015 [place], [place]. This information is not intended to replace advice given to you by your health care provider. Make  sure you discuss any questions you have with your health care provider.

## 2014-10-18 NOTE — Progress Notes (Signed)
   Subjective:    Patient ID: Chris [name], male    DOB: 12/19/1965, 49 y.o.   MRN: [id number]  HPI  N-DRY  AND THICK SKIN. L-B/L BOTTOM OF THE FOOT D-1 YEAR O-SLOWLY C-SAME A-NONE T-VASELINE BUT NO HELP  ALSO, TOENAILS TRIM. FYI HIV POSITIVE  Patient states that he has difficulty tolerating shoes because of elongated toenails. He denies any history of skin ulceration or claudication  Review of Systems  Eyes: Positive for discharge, redness and itching.  Skin: Positive for color change.  Neurological: Positive for dizziness.  All other systems reviewed and are negative.      Objective:   Physical Exam  Orientated 3  Vascular: DP pulses 2/4 bilaterally PT pulses 2/4 bilaterally Capillary reflex immediate bilaterally  Neurological: Sensation to 10 g monofilament wire intact 5/5 right and 1/5 left Vibratory sensation equal and reactive bilaterally Ankle reflex equal and reactive bilaterally  Dermatological: Extremely dry plantar skin noted bilaterally The toenails are extremely elongated, hypertrophic, discolored, incurvated, and tender to palpation 6-10  Musculoskeletal: No deformities noted bilaterally There is no obvious restriction ankle, subtalar, midtarsal joints bilaterally       Assessment & Plan:   Assessment: Satisfactory vascular status Loss of protective sensation left Symptomatic onychomycoses 6-10 Xerosis   Plan: Debridement toenails 10 without a bleeding Patient was advised to apply all-purpose skin lotion twice a day and at night also consider the use of Saran wrap with his sock to further reduce the excessive dryness in the skin Also, suggested the use a pumice stone or callus file after showering to reduce some of the scaling from dry skin on his plantar feet bilaterally  Reappoint 3 months

## 2014-10-24 IMAGING — CR DG LUMBAR SPINE COMPLETE 4+V
5 series · 5 of 5 positions shown · non-contrast
Comparison: None.

CLINICAL DATA: Low back pain 1 month.  No injury.

EXAM:
LUMBAR SPINE - COMPLETE 4+ VIEW

[view not recorded (1 of 5)]
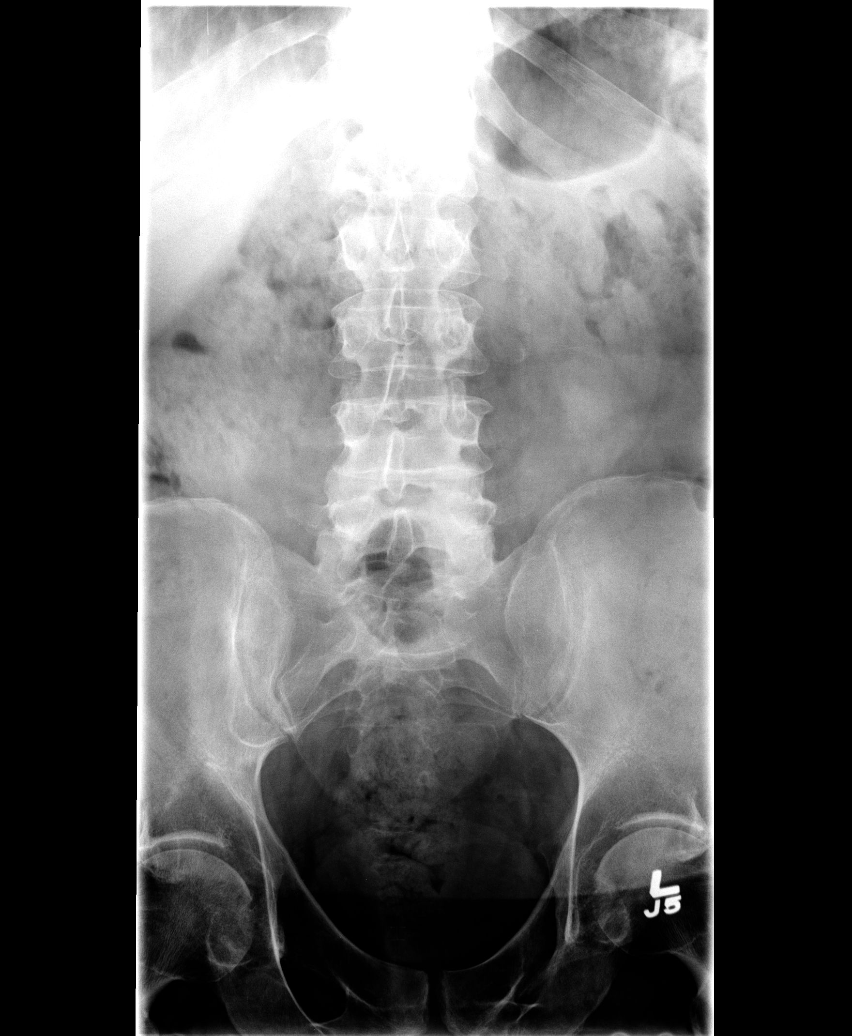

[view not recorded (2 of 5)]
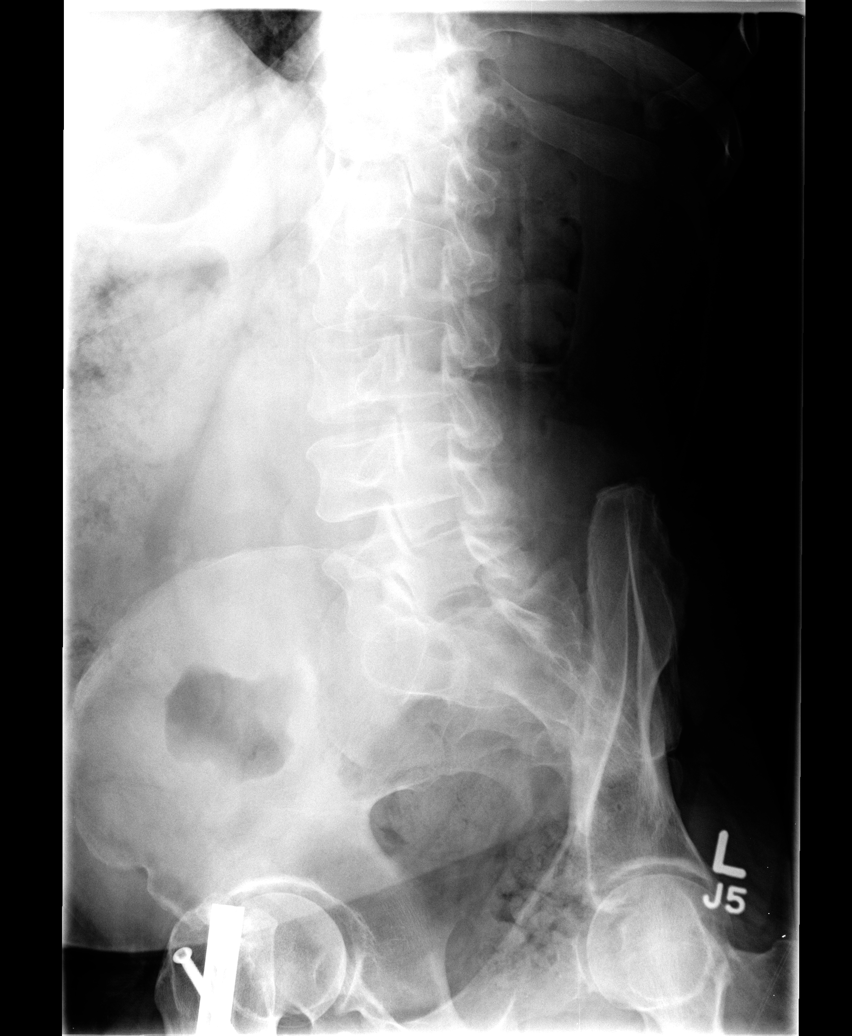

[view not recorded (3 of 5)]
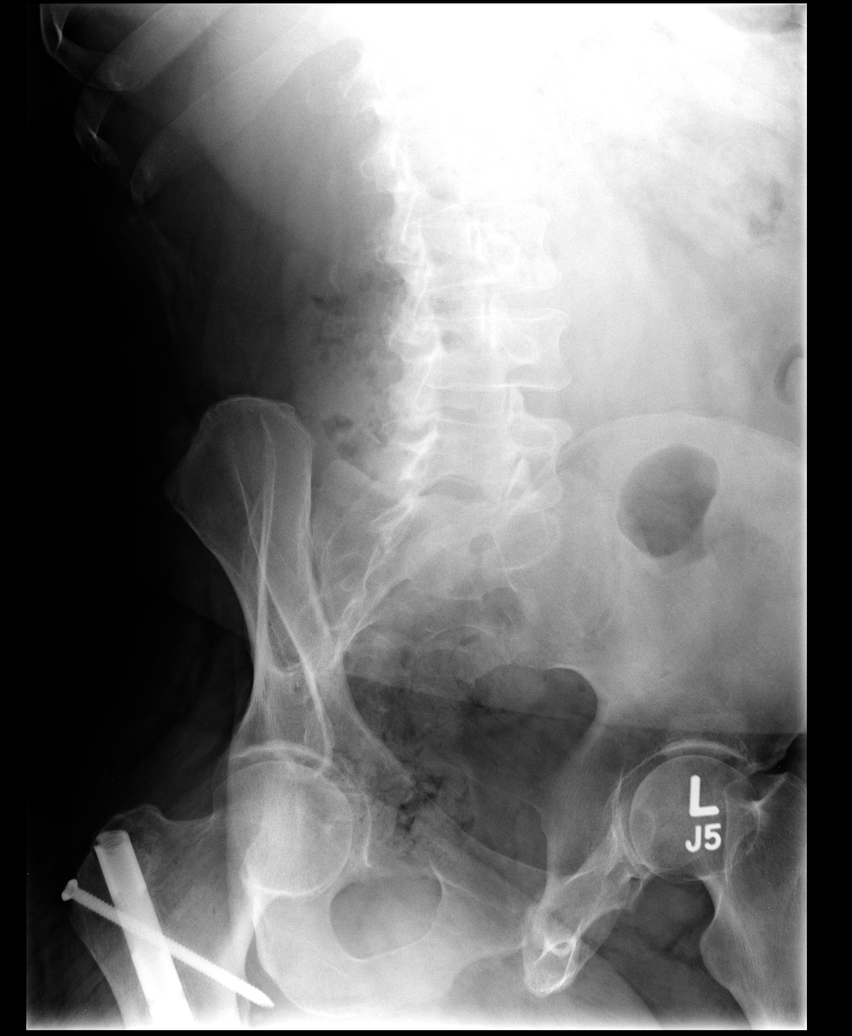

[view not recorded (4 of 5)]
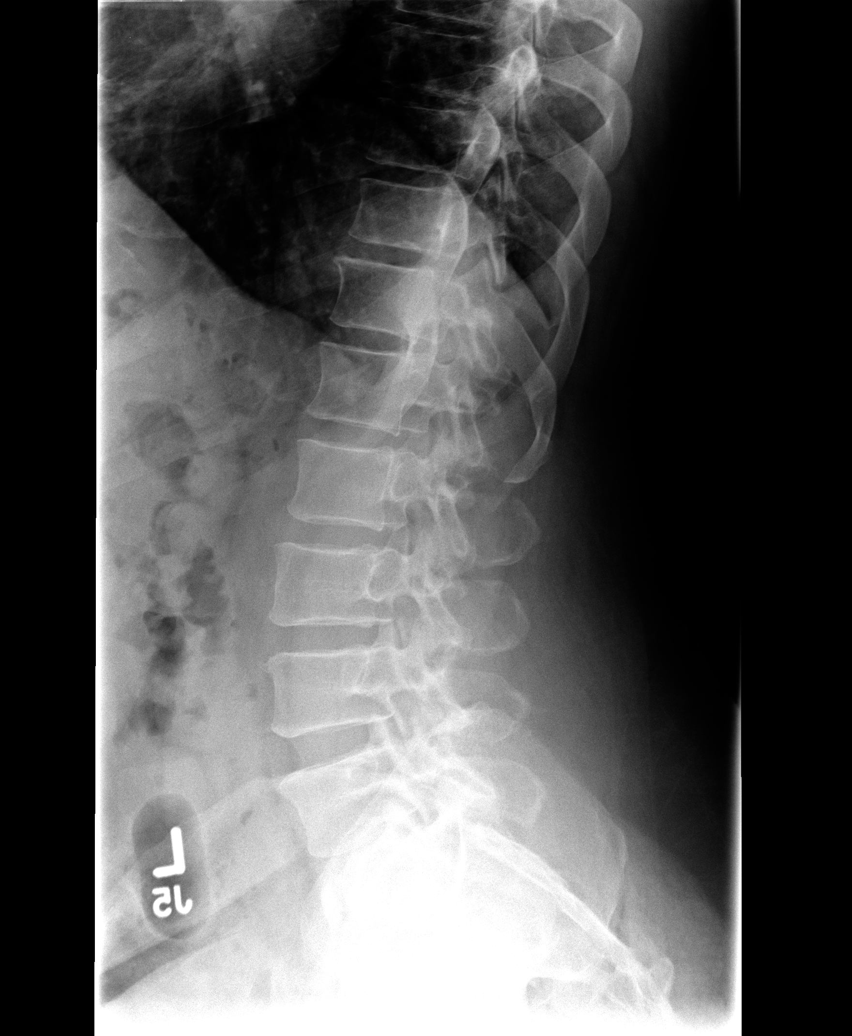

[view not recorded (5 of 5)]
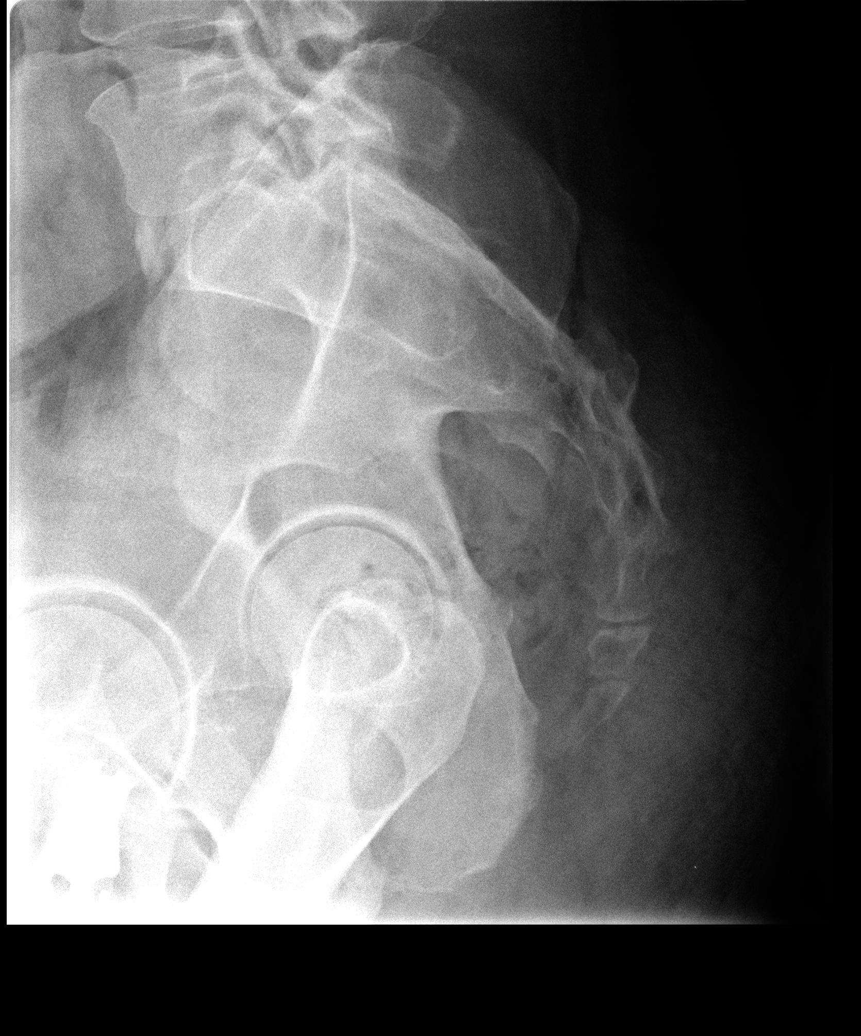

[5 of 5 positions shown; findings below may reference images not displayed]

FINDINGS: Vertebral body alignment, heights and disc space heights are within
normal. There is minimal spondylosis present. There is no
compression fracture or subluxation. There is minimal facet
arthropathy over the lower lumbar spine. Fixation hardware is intact
over the right proximal femur.
IMPRESSION: No acute findings.

Mild spondylosis of the lumbar spine.

## 2014-10-30 ENCOUNTER — Other Ambulatory Visit: Payer: Self-pay | Admitting: Emergency Medicine

## 2014-10-30 ENCOUNTER — Telehealth: Payer: Self-pay | Admitting: Internal Medicine

## 2014-10-30 DIAGNOSIS — J441 Chronic obstructive pulmonary disease with (acute) exacerbation: Secondary | ICD-10-CM

## 2014-10-30 MED ORDER — TIOTROPIUM BROMIDE MONOHYDRATE 2.5 MCG/ACT IN AERS: INHALATION_SPRAY | RESPIRATORY_TRACT | Status: DC

## 2014-10-30 NOTE — Telephone Encounter (Signed)
Pt called requesting medication refill for Tiotropium Bromide Monohydrate (SPIRIVA RESPIMAT) 2.5 MCG/ACT AERS and acetaminophen-codeine (TYLENOL #3) 300-30 MG per tablet. Please f/u with pt

## 2014-11-02 ENCOUNTER — Telehealth: Payer: Self-pay | Admitting: Emergency Medicine

## 2014-11-02 NOTE — Telephone Encounter (Signed)
FORM NEEDS MD SIGNATURE [facility]

## 2014-11-27 ENCOUNTER — Encounter (HOSPITAL_COMMUNITY): Payer: Self-pay | Admitting: Emergency Medicine

## 2014-11-27 ENCOUNTER — Emergency Department (HOSPITAL_COMMUNITY): Payer: Medicaid Other

## 2014-11-27 ENCOUNTER — Emergency Department (HOSPITAL_COMMUNITY)
Admission: EM | Admit: 2014-11-27 | Discharge: 2014-11-27 | Disposition: A | Discharge status: Home / Self Care | Payer: Medicaid Other | Attending: Emergency Medicine | Admitting: Emergency Medicine

## 2014-11-27 DIAGNOSIS — J441 Chronic obstructive pulmonary disease with (acute) exacerbation: Secondary | ICD-10-CM | POA: Insufficient documentation

## 2014-11-27 DIAGNOSIS — M549 Dorsalgia, unspecified: Secondary | ICD-10-CM | POA: Insufficient documentation

## 2014-11-27 DIAGNOSIS — Z7952 Long term (current) use of systemic steroids: Secondary | ICD-10-CM | POA: Insufficient documentation

## 2014-11-27 DIAGNOSIS — Z21 Asymptomatic human immunodeficiency virus [HIV] infection status: Secondary | ICD-10-CM | POA: Diagnosis not present

## 2014-11-27 DIAGNOSIS — Z794 Long term (current) use of insulin: Secondary | ICD-10-CM | POA: Insufficient documentation

## 2014-11-27 DIAGNOSIS — Z720 Tobacco use: Secondary | ICD-10-CM | POA: Diagnosis not present

## 2014-11-27 DIAGNOSIS — Z79899 Other long term (current) drug therapy: Secondary | ICD-10-CM | POA: Diagnosis not present

## 2014-11-27 DIAGNOSIS — J40 Bronchitis, not specified as acute or chronic: Secondary | ICD-10-CM

## 2014-11-27 DIAGNOSIS — E119 Type 2 diabetes mellitus without complications: Secondary | ICD-10-CM | POA: Insufficient documentation

## 2014-11-27 DIAGNOSIS — R0602 Shortness of breath: Secondary | ICD-10-CM | POA: Diagnosis present

## 2014-11-27 DIAGNOSIS — Z7951 Long term (current) use of inhaled steroids: Secondary | ICD-10-CM | POA: Diagnosis not present

## 2014-11-27 LAB — CBC WITH DIFFERENTIAL/PLATELET
BASOS PCT: 1 % (ref 0–1)
Basophils Absolute: 0 10*3/uL (ref 0.0–0.1)
Eosinophils Absolute: 0.1 10*3/uL (ref 0.0–0.7)
Eosinophils Relative: 2 % (ref 0–5)
HCT: 45.6 % (ref 39.0–52.0)
Hemoglobin: 13.9 g/dL (ref 13.0–17.0)
Lymphocytes Relative: 26 % (ref 12–46)
Lymphs Abs: 1.2 10*3/uL (ref 0.7–4.0)
MCH: 28.2 pg (ref 26.0–34.0)
MCHC: 30.5 g/dL (ref 30.0–36.0)
MCV: 92.5 fL (ref 78.0–100.0)
MONO ABS: 1.1 10*3/uL — AB (ref 0.1–1.0)
Monocytes Relative: 24 % — ABNORMAL HIGH (ref 3–12)
Neutro Abs: 2.2 10*3/uL (ref 1.7–7.7)
Neutrophils Relative %: 47 % (ref 43–77)
PLATELETS: 144 10*3/uL — AB (ref 150–400)
RBC: 4.93 MIL/uL (ref 4.22–5.81)
RDW: 12.8 % (ref 11.5–15.5)
WBC: 4.6 10*3/uL (ref 4.0–10.5)

## 2014-11-27 LAB — COMPREHENSIVE METABOLIC PANEL
ALBUMIN: 3.7 g/dL (ref 3.5–5.2)
ALK PHOS: 41 U/L (ref 39–117)
ALT: 27 U/L (ref 0–53)
AST: 27 U/L (ref 0–37)
Anion gap: 9 (ref 5–15)
BILIRUBIN TOTAL: 0.8 mg/dL (ref 0.3–1.2)
BUN: 8 mg/dL (ref 6–23)
CHLORIDE: 102 mmol/L (ref 96–112)
CO2: 25 mmol/L (ref 19–32)
Calcium: 8.9 mg/dL (ref 8.4–10.5)
Creatinine, Ser: 0.9 mg/dL (ref 0.50–1.35)
GFR calc Af Amer: >90 mL/min (ref >90)
GFR calc non Af Amer: >90 mL/min (ref >90)
Glucose, Bld: 89 mg/dL (ref 70–99)
POTASSIUM: 3.8 mmol/L (ref 3.5–5.1)
SODIUM: 136 mmol/L (ref 135–145)
Total Protein: 8.7 g/dL — ABNORMAL HIGH (ref 6.0–8.3)

## 2014-11-27 LAB — I-STAT TROPONIN, ED: Troponin i, poc: 0 ng/mL (ref 0.00–0.08)

## 2014-11-27 LAB — BRAIN NATRIURETIC PEPTIDE: B Natriuretic Peptide: 33.7 pg/mL (ref 0.0–100.0)

## 2014-11-27 LAB — I-STAT CG4 LACTIC ACID, ED: Lactic Acid, Venous: 1.79 mmol/L (ref 0.5–2.0)

## 2014-11-27 MED ORDER — ALBUTEROL SULFATE (2.5 MG/3ML) 0.083% IN NEBU
5.0000 mg | INHALATION_SOLUTION | Freq: Once | RESPIRATORY_TRACT | Status: AC
Start: 2014-11-27 — End: 2014-11-27
  Administered 2014-11-27: 5 mg via RESPIRATORY_TRACT
  Filled 2014-11-27: qty 6

## 2014-11-27 MED ORDER — PREDNISONE 20 MG PO TABS
60.0000 mg | ORAL_TABLET | Freq: Once | ORAL | Status: AC
  Administered 2014-11-27: 60 mg via ORAL
  Filled 2014-11-27: qty 3

## 2014-11-27 MED ORDER — BENZONATATE 100 MG PO CAPS: 100.0000 mg | ORAL_CAPSULE | Freq: Three times a day (TID) | ORAL | Status: DC

## 2014-11-27 MED ORDER — PREDNISONE 10 MG PO TABS: ORAL_TABLET | ORAL | Status: DC

## 2014-11-27 MED ORDER — IPRATROPIUM BROMIDE 0.02 % IN SOLN
0.5000 mg | Freq: Once | RESPIRATORY_TRACT | Status: AC
Start: 2014-11-27 — End: 2014-11-27
  Administered 2014-11-27: 0.5 mg via RESPIRATORY_TRACT
  Filled 2014-11-27: qty 2.5

## 2014-11-27 MED ORDER — LEVOFLOXACIN 750 MG PO TABS: 750.0000 mg | ORAL_TABLET | Freq: Every day | ORAL | Status: DC

## 2014-11-27 MED ORDER — OSELTAMIVIR PHOSPHATE 75 MG PO CAPS: 75.0000 mg | ORAL_CAPSULE | Freq: Two times a day (BID) | ORAL | Status: DC

## 2014-11-27 MED ORDER — HYDROCOD POLST-CPM POLST ER 10-8 MG/5ML PO SUER
5.0000 mL | Freq: Once | ORAL | Status: AC
  Administered 2014-11-27: 5 mL via ORAL
  Filled 2014-11-27: qty 5

## 2014-11-27 MED ORDER — OSELTAMIVIR PHOSPHATE 75 MG PO CAPS
75.0000 mg | ORAL_CAPSULE | Freq: Once | ORAL | Status: AC
  Administered 2014-11-27: 75 mg via ORAL
  Filled 2014-11-27 (×2): qty 1

## 2014-11-27 NOTE — ED Notes (Signed)
Pt sts CP worse with cough and SOB; pt noted to be diaphoretic; pt on home O2; pt sts pain worse with cough

## 2014-11-27 NOTE — ED Provider Notes (Signed)
The patient is a 49 year old male, he has a history of HIV, COPD and is on chronic oxygen by nasal cannula at 2 L. He states that he wears this on most days. He reports approximately 48 hours of runny nose, dry cough, feeling hot but denies fevers chills or myalgias. He has had some phlegm running down the back of his throat, has his chronic back pain which is no worse than usual. His wife was just admitted to the hospital with positive flu, spent 24 hours and was discharged uneventfully. On exam the patient has a normal-appearing oropharynx, nasal passages are congested with small amount of rhinorrhea, mucous membranes are moist, heart sounds are regular without murmurs rubs or gallops, lungs are clear with no wheezing rales or rhonchi, no increased work of breathing, frequent coughing spells with taking deep breaths. There is no significant peripheral edema, no JVD, vital signs checked on my exam show blood pressure of 112/60, heart rate of 88, oxygen of 94% on his home oxygen. Labs were reviewed and are unremarkable, medical record reviewed, last CD4 over 200, the patient has a suspicious finding for possible infiltrate at the right heart border, he does not appear toxic, is tolerating oral food and fluid without nausea or vomiting, we'll treat for both flu and community-acquired pneumonia given the patient's underlying immunosuppression, we will discuss with patient. He appears stable for discharge with these instructions.  Medical screening examination/treatment/procedure(s) were conducted as a shared visit with non-physician practitioner(s) and myself.  I personally evaluated the patient during the encounter.  Clinical Impression:   Final diagnoses:  Bronchitis         Eber HongBrian Khalilah Hoke, MD 11/28/14 [phone]

## 2014-11-27 NOTE — Discharge Instructions (Signed)
Use albuterol inhalers or breathing treatments every 4 hours for the next 3 days. Prednisone as prescribed until all gone. Next is tomorrow. Levaquin as prescribed until all gone. tamiflu as prescribed until all gone. Follow-up with your primary care doctor and Dr. Sherene SiresWert. Return if worsening symptoms  Acute Bronchitis Bronchitis is inflammation of the airways that extend from the windpipe into the lungs (bronchi). The inflammation often causes mucus to develop. This leads to a cough, which is the most common symptom of bronchitis.  In acute bronchitis, the condition usually develops suddenly and goes away over time, usually in a couple weeks. Smoking, allergies, and asthma can make bronchitis worse. Repeated episodes of bronchitis may cause further lung problems.  CAUSES Acute bronchitis is most often caused by the same virus that causes a cold. The virus can spread from person to person (contagious) through coughing, sneezing, and touching contaminated objects. SIGNS AND SYMPTOMS   Cough.   Fever.   Coughing up mucus.   Body aches.   Chest congestion.   Chills.   Shortness of breath.   Sore throat.  DIAGNOSIS  Acute bronchitis is usually diagnosed through a physical exam. Your health care provider will also ask you questions about your medical history. Tests, such as chest X-rays, are sometimes done to rule out other conditions.  TREATMENT  Acute bronchitis usually goes away in a couple weeks. Oftentimes, no medical treatment is necessary. Medicines are sometimes given for relief of fever or cough. Antibiotic medicines are usually not needed but may be prescribed in certain situations. In some cases, an inhaler may be recommended to help reduce shortness of breath and control the cough. A cool mist vaporizer may also be used to help thin bronchial secretions and make it easier to clear the chest.  HOME CARE INSTRUCTIONS  Get plenty of rest.   Drink enough fluids to keep your  urine clear or pale yellow (unless you have a medical condition that requires fluid restriction). Increasing fluids may help thin your respiratory secretions (sputum) and reduce chest congestion, and it will prevent dehydration.   Take medicines only as directed by your health care provider.  If you were prescribed an antibiotic medicine, finish it all even if you start to feel better.  Avoid smoking and secondhand smoke. Exposure to cigarette smoke or irritating chemicals will make bronchitis worse. If you are a smoker, consider using nicotine gum or skin patches to help control withdrawal symptoms. Quitting smoking will help your lungs heal faster.   Reduce the chances of another bout of acute bronchitis by washing your hands frequently, avoiding people with cold symptoms, and trying not to touch your hands to your mouth, nose, or eyes.   Keep all follow-up visits as directed by your health care provider.  SEEK MEDICAL CARE IF: Your symptoms do not improve after 1 week of treatment.  SEEK IMMEDIATE MEDICAL CARE IF:  You develop an increased fever or chills.   You have chest pain.   You have severe shortness of breath.  You have bloody sputum.   You develop dehydration.  You faint or repeatedly feel like you are going to pass out.  You develop repeated vomiting.  You develop a severe headache. MAKE SURE YOU:   Understand these instructions.  Will watch your condition.  Will get help right away if you are not doing well or get worse. Document Released: 09/04/2004 Document Revised: 12/12/2013 Document Reviewed: 01/18/2013 [facility]ExitCare Patient Information 2015 [place], [place]. This information is not  intended to replace advice given to you by your health care provider. Make sure you discuss any questions you have with your health care provider. ° °
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
## 2014-12-04 ENCOUNTER — Encounter: Payer: Self-pay | Admitting: Internal Medicine

## 2014-12-04 ENCOUNTER — Ambulatory Visit: Payer: Medicaid Other | Attending: Internal Medicine | Admitting: Internal Medicine

## 2014-12-04 VITALS — BP 128/71 | HR 96 | Temp 98.7°F | Resp 16 | Ht 69.0 in | Wt 271.0 lb

## 2014-12-04 DIAGNOSIS — Z794 Long term (current) use of insulin: Secondary | ICD-10-CM | POA: Diagnosis not present

## 2014-12-04 DIAGNOSIS — I10 Essential (primary) hypertension: Secondary | ICD-10-CM | POA: Diagnosis not present

## 2014-12-04 DIAGNOSIS — E119 Type 2 diabetes mellitus without complications: Secondary | ICD-10-CM

## 2014-12-04 DIAGNOSIS — J449 Chronic obstructive pulmonary disease, unspecified: Secondary | ICD-10-CM

## 2014-12-04 DIAGNOSIS — Z720 Tobacco use: Secondary | ICD-10-CM | POA: Insufficient documentation

## 2014-12-04 DIAGNOSIS — J441 Chronic obstructive pulmonary disease with (acute) exacerbation: Secondary | ICD-10-CM | POA: Insufficient documentation

## 2014-12-04 DIAGNOSIS — L209 Atopic dermatitis, unspecified: Secondary | ICD-10-CM | POA: Diagnosis not present

## 2014-12-04 LAB — GLUCOSE, POCT (MANUAL RESULT ENTRY): POC Glucose: 227 mg/dl — AB (ref 70–99)

## 2014-12-04 MED ORDER — METFORMIN HCL 500 MG PO TABS: 500.0000 mg | ORAL_TABLET | Freq: Two times a day (BID) | ORAL | Status: DC

## 2014-12-04 MED ORDER — ACETAMINOPHEN-CODEINE #3 300-30 MG PO TABS: 1.0000 | ORAL_TABLET | ORAL | Status: DC | PRN

## 2014-12-04 MED ORDER — TIOTROPIUM BROMIDE MONOHYDRATE 2.5 MCG/ACT IN AERS: INHALATION_SPRAY | RESPIRATORY_TRACT | Status: DC

## 2014-12-04 MED ORDER — TRIAMCINOLONE ACETONIDE 0.1 % EX CREA: 1.0000 "application " | TOPICAL_CREAM | Freq: Two times a day (BID) | CUTANEOUS | Status: DC

## 2014-12-04 MED ORDER — ALBUTEROL SULFATE HFA 108 (90 BASE) MCG/ACT IN AERS: 2.0000 | INHALATION_SPRAY | RESPIRATORY_TRACT | Status: DC | PRN

## 2014-12-04 MED ORDER — LISINOPRIL 10 MG PO TABS: 10.0000 mg | ORAL_TABLET | Freq: Every day | ORAL | Status: DC

## 2014-12-04 MED ORDER — BUDESONIDE-FORMOTEROL FUMARATE 160-4.5 MCG/ACT IN AERO: 2.0000 | INHALATION_SPRAY | Freq: Two times a day (BID) | RESPIRATORY_TRACT | Status: DC

## 2014-12-04 NOTE — Progress Notes (Signed)
Pt is here following up on his diabetes. Pt states that his back is in severe pain. Pt states that he has no energy. Pt has been out of his metformin for 3 days.

## 2014-12-04 NOTE — Patient Instructions (Signed)
Basic Carbohydrate Counting for Diabetes Mellitus Carbohydrate counting is a method for keeping track of the amount of carbohydrates you eat. Eating carbohydrates naturally increases the level of sugar (glucose) in your blood, so it is important for you to know the amount that is okay for you to have in every meal. Carbohydrate counting helps keep the level of glucose in your blood within normal limits. The amount of carbohydrates allowed is different for every person. A dietitian can help you calculate the amount that is right for you. Once you know the amount of carbohydrates you can have, you can count the carbohydrates in the foods you want to eat. Carbohydrates are found in the following foods:  Grains, such as breads and cereals.  Dried beans and soy products.  Starchy vegetables, such as potatoes, peas, and corn.  Fruit and fruit juices.  Milk and yogurt.  Sweets and snack foods, such as cake, cookies, candy, chips, soft drinks, and fruit drinks. CARBOHYDRATE COUNTING There are two ways to count the carbohydrates in your food. You can use either of the methods or a combination of both. Reading the "Nutrition Facts" on Packaged Food The "Nutrition Facts" is an area that is included on the labels of almost all packaged food and beverages in the [place]. It includes the serving size of that food or beverage and information about the nutrients in each serving of the food, including the grams (g) of carbohydrate per serving.  Decide the number of servings of this food or beverage that you will be able to eat or drink. Multiply that number of servings by the number of grams of carbohydrate that is listed on the label for that serving. The total will be the amount of carbohydrates you will be having when you eat or drink this food or beverage. Learning Standard Serving Sizes of Food When you eat food that is not packaged or does not include "Nutrition Facts" on the label, you need to  measure the servings in order to count the amount of carbohydrates.A serving of most carbohydrate-rich foods contains about 15 g of carbohydrates. The following list includes serving sizes of carbohydrate-rich foods that provide 15 g ofcarbohydrate per serving:   1 slice of bread (1 oz) or 1 six-inch tortilla.    of a hamburger bun or English muffin.  4-6 crackers.   cup unsweetened dry cereal.    cup hot cereal.   cup rice or pasta.    cup mashed potatoes or  of a large baked potato.  1 cup fresh fruit or one small piece of fruit.    cup canned or frozen fruit or fruit juice.  1 cup milk.   cup plain fat-free yogurt or yogurt sweetened with artificial sweeteners.   cup cooked dried beans or starchy vegetable, such as peas, corn, or potatoes.  Decide the number of standard-size servings that you will eat. Multiply that number of servings by 15 (the grams of carbohydrates in that serving). For example, if you eat 2 cups of strawberries, you will have eaten 2 servings and 30 g of carbohydrates (2 servings x 15 g = 30 g). For foods such as soups and casseroles, in which more than one food is mixed in, you will need to count the carbohydrates in each food that is included. EXAMPLE OF CARBOHYDRATE COUNTING Sample Dinner  3 oz chicken breast.   cup of brown rice.   cup of corn.  1 cup milk.   1 cup strawberries with   sugar-free whipped topping.  Carbohydrate Calculation Step 1: Identify the foods that contain carbohydrates:   Rice.   Corn.   Milk.   Strawberries. Step 2:Calculate the number of servings eaten of each:   2 servings of rice.   1 serving of corn.   1 serving of milk.   1 serving of strawberries. Step 3: Multiply each of those number of servings by 15 g:   2 servings of rice x 15 g = 30 g.   1 serving of corn x 15 g = 15 g.   1 serving of milk x 15 g = 15 g.   1 serving of strawberries x 15 g = 15 g. Step 4: Add  together all of the amounts to find the total grams of carbohydrates eaten: 30 g + 15 g + 15 g + 15 g = 75 g. Document Released: 07/28/2005 Document Revised: 12/12/2013 Document Reviewed: 06/24/2013 [facility] Patient Information 2015 [place], [place]. This information is not intended to replace advice given to you by your health care provider. Make sure you discuss any questions you have with your health care provider. Chronic Obstructive Pulmonary Disease Chronic obstructive pulmonary disease (COPD) is a common lung condition in which airflow from the lungs is limited. COPD is a general term that can be used to describe many different lung problems that limit airflow, including both chronic bronchitis and emphysema. If you have COPD, your lung function will probably never return to normal, but there are measures you can take to improve lung function and make yourself feel better.  CAUSES   Smoking (common).   Exposure to secondhand smoke.   Genetic problems.  Chronic inflammatory lung diseases or recurrent infections. SYMPTOMS   Shortness of breath, especially with physical activity.   Deep, persistent (chronic) cough with a large amount of thick mucus.   Wheezing.   Rapid breaths (tachypnea).   Gray or bluish discoloration (cyanosis) of the skin, especially in fingers, toes, or lips.   Fatigue.   Weight loss.   Frequent infections or episodes when breathing symptoms become much worse (exacerbations).   Chest tightness. DIAGNOSIS  Your health care provider will take a medical history and perform a physical examination to make the initial diagnosis. Additional tests for COPD may include:   Lung (pulmonary) function tests.  Chest X-ray.  CT scan.  Blood tests. TREATMENT  Treatment available to help you feel better when you have COPD includes:   Inhaler and nebulizer medicines. These help manage the symptoms of COPD and make your breathing more  comfortable.  Supplemental oxygen. Supplemental oxygen is only helpful if you have a low oxygen level in your blood.   Exercise and physical activity. These are beneficial for nearly all people with COPD. Some people may also benefit from a pulmonary rehabilitation program. HOME CARE INSTRUCTIONS   Take all medicines (inhaled or pills) as directed by your health care provider.  Avoid over-the-counter medicines or cough syrups that dry up your airway (such as antihistamines) and slow down the elimination of secretions unless instructed otherwise by your health care provider.   If you are a smoker, the most important thing that you can do is stop smoking. Continuing to smoke will cause further lung damage and breathing trouble. Ask your health care provider for help with quitting smoking. He or she can direct you to community resources or hospitals that provide support.  Avoid exposure to irritants such as smoke, chemicals, and fumes that aggravate your breathing.  Use oxygen therapy and pulmonary rehabilitation if directed by your health care provider. If you require home oxygen therapy, ask your health care provider whether you should purchase a pulse oximeter to measure your oxygen level at home.   Avoid contact with individuals who have a contagious illness.  Avoid extreme temperature and humidity changes.  Eat healthy foods. Eating smaller, more frequent meals and resting before meals may help you maintain your strength.  Stay active, but balance activity with periods of rest. Exercise and physical activity will help you maintain your ability to do things you want to do.  Preventing infection and hospitalization is very important when you have COPD. Make sure to receive all the vaccines your health care provider recommends, especially the pneumococcal and influenza vaccines. Ask your health care provider whether you need a pneumonia vaccine.  Learn and use relaxation techniques to  manage stress.  Learn and use controlled breathing techniques as directed by your health care provider. Controlled breathing techniques include:   Pursed lip breathing. Start by breathing in (inhaling) through your nose for 1 second. Then, purse your lips as if you were going to whistle and breathe out (exhale) through the pursed lips for 2 seconds.   Diaphragmatic breathing. Start by putting one hand on your abdomen just above your waist. Inhale slowly through your nose. The hand on your abdomen should move out. Then purse your lips and exhale slowly. You should be able to feel the hand on your abdomen moving in as you exhale.   Learn and use controlled coughing to clear mucus from your lungs. Controlled coughing is a series of short, progressive coughs. The steps of controlled coughing are:   Lean your head slightly forward.   Breathe in deeply using diaphragmatic breathing.   Try to hold your breath for 3 seconds.   Keep your mouth slightly open while coughing twice.   Spit any mucus out into a tissue.   Rest and repeat the steps once or twice as needed. SEEK MEDICAL CARE IF:   You are coughing up more mucus than usual.   There is a change in the color or thickness of your mucus.   Your breathing is more labored than usual.   Your breathing is faster than usual.  SEEK IMMEDIATE MEDICAL CARE IF:   You have shortness of breath while you are resting.   You have shortness of breath that prevents you from:  Being able to talk.   Performing your usual physical activities.   You have chest pain lasting longer than 5 minutes.   Your skin color is more cyanotic than usual.  You measure low oxygen saturations for longer than 5 minutes with a pulse oximeter. MAKE SURE YOU:   Understand these instructions.  Will watch your condition.  Will get help right away if you are not doing well or get worse. Document Released: 05/07/2005 Document Revised: 12/12/2013  Document Reviewed: 03/24/2013 [facility] Patient Information 2015 [place], [place]. This information is not intended to replace advice given to you by your health care provider. Make sure you discuss any questions you have with your health care provider. Hypertension Hypertension, commonly called high blood pressure, is when the force of blood pumping through your arteries is too strong. Your arteries are the blood vessels that carry blood from your heart throughout your body. A blood pressure reading consists of a higher number over a lower number, such as 110/72. The higher number (systolic) is the pressure inside your arteries  when your heart pumps. The lower number (diastolic) is the pressure inside your arteries when your heart relaxes. Ideally you want your blood pressure below 120/80. Hypertension forces your heart to work harder to pump blood. Your arteries may become narrow or stiff. Having hypertension puts you at risk for heart disease, stroke, and other problems.  RISK FACTORS Some risk factors for high blood pressure are controllable. Others are not.  Risk factors you cannot control include:   Race. You may be at higher risk if you are African American.  Age. Risk increases with age.  Gender. Men are at higher risk than women before age 61 years. After age 72, women are at higher risk than men. Risk factors you can control include:  Not getting enough exercise or physical activity.  Being overweight.  Getting too much fat, sugar, calories, or salt in your diet.  Drinking too much alcohol. SIGNS AND SYMPTOMS Hypertension does not usually cause signs or symptoms. Extremely high blood pressure (hypertensive crisis) may cause headache, anxiety, shortness of breath, and nosebleed. DIAGNOSIS  To check if you have hypertension, your health care provider will measure your blood pressure while you are seated, with your arm held at the level of your heart. It should be measured at least  twice using the same arm. Certain conditions can cause a difference in blood pressure between your right and left arms. A blood pressure reading that is higher than normal on one occasion does not mean that you need treatment. If one blood pressure reading is high, ask your health care provider about having it checked again. TREATMENT  Treating high blood pressure includes making lifestyle changes and possibly taking medicine. Living a healthy lifestyle can help lower high blood pressure. You may need to change some of your habits. Lifestyle changes may include:  Following the DASH diet. This diet is high in fruits, vegetables, and whole grains. It is low in salt, red meat, and added sugars.  Getting at least 2 hours of brisk physical activity every week.  Losing weight if necessary.  Not smoking.  Limiting alcoholic beverages.  Learning ways to reduce stress. If lifestyle changes are not enough to get your blood pressure under control, your health care provider may prescribe medicine. You may need to take more than one. Work closely with your health care provider to understand the risks and benefits. HOME CARE INSTRUCTIONS  Have your blood pressure rechecked as directed by your health care provider.   Take medicines only as directed by your health care provider. Follow the directions carefully. Blood pressure medicines must be taken as prescribed. The medicine does not work as well when you skip doses. Skipping doses also puts you at risk for problems.   Do not smoke.   Monitor your blood pressure at home as directed by your health care provider. SEEK MEDICAL CARE IF:   You think you are having a reaction to medicines taken.  You have recurrent headaches or feel dizzy.  You have swelling in your ankles.  You have trouble with your vision. SEEK IMMEDIATE MEDICAL CARE IF:  You develop a severe headache or confusion.  You have unusual weakness, numbness, or feel  faint.  You have severe chest or abdominal pain.  You vomit repeatedly.  You have trouble breathing. MAKE SURE YOU:   Understand these instructions.  Will watch your condition.  Will get help right away if you are not doing well or get worse. Document Released: 07/28/2005 Document Revised: 12/12/2013  Document Reviewed: 05/20/2013 [facility] Patient Information 2015 [place], [place]. This information is not intended to replace advice given to you by your health care provider. Make sure you discuss any questions you have with your health care provider.

## 2014-12-04 NOTE — Progress Notes (Signed)
Patient ID: Chris [name], male   Chris: 05/16/1966, 49 y.o.   MRN: [id number]   Chris [name], is a 49 y.o. male  Chris [name]  Chris [name]  Chris [name]  Chief Complaint  Patient presents with  . Follow-up        Subjective:   Chris [name] a 49 y.o. male here today for a follow up visit. Patient has HIV disease controlled on medications, diabetes mellitus, morbid obesity, hypertension, COPD on home oxygen. He is here today for routine follow-up, major complaint today include ongoing back pain, patient is requesting something for pain. He follows up regularly with infectious disease specialist. Patient was seen in the ED last week for complaints of cough, congestion or shortness of breath. At that time he was having cough productive of clear sputum associated with shortness of breath. He continues to smoke cigarettes heavily. He claims compliant to his Spiriva and Symbicort, using albuterol as needed. His chest x-ray in the ED showed possible new changes around the right heart border, was started on antibiotics Levaquin. Patient completed the course, here for follow-up today. His cough and shortness of breath has improved. He also needs refill his medications. Patient has No headache, No chest pain, No abdominal pain - No Nausea, No new weakness tingling or numbness.  Problem  Essential Hypertension, Benign  Atopic Eczema    ALLERGIES: Allergies  Allergen Reactions  . Bactrim [Sulfamethoxazole-Trimethoprim] Hives    PAST MEDICAL HISTORY: Past Medical History  Diagnosis Date  . COPD (chronic obstructive pulmonary disease)   . HIV disease   . Emphysema   . Diabetes mellitus without complication     MEDICATIONS AT HOME: Prior to Admission medications   Medication Sig Start Date End Date Taking? Authorizing Provider  albuterol (PROVENTIL) (5 MG/ML) 0.5% nebulizer solution Take 0.5 mLs (2.5 mg total) by nebulization every 4 (four) hours as needed for wheezing or shortness of  breath (((PLAN C))). Patient taking differently: Take 2.5 mg by nebulization daily.  04/28/14  Yes Tresa Garter, MD  Blood Glucose Monitoring Suppl (ACCU-CHEK AVIVA PLUS) W/DEVICE KIT 250 Test up to 4 times daily 04/28/14  Yes Tresa Garter, MD  budesonide-formoterol (SYMBICORT) 160-4.5 MCG/ACT inhaler Inhale 2 puffs into the lungs 2 (two) times daily. 12/04/14  Yes Tresa Garter, MD  dapsone 100 MG tablet Take 1 tablet (100 mg total) by mouth daily. 02/08/14  Yes Carlyle Basques, MD  Darunavir Ethanolate (PREZISTA) 800 MG tablet Take 1 tablet (800 mg total) by mouth daily with breakfast. 06/20/14  Yes Carlyle Basques, MD  elvitegravir-cobicistat-emtricitabine-tenofovir (STRIBILD) 150-150-200-300 MG TABS tablet Take 1 tablet by mouth daily with breakfast. 06/20/14  Yes Carlyle Basques, MD  glucose blood test strip Use as instructed 04/20/14  Yes Rosellen Lichtenberger E Doreene Burke, MD  insulin glargine (LANTUS) 100 UNIT/ML injection Inject 0.1 mLs (10 Units total) into the skin at bedtime. 09/28/14  Yes Tresa Garter, MD  Insulin Pen Needle 32G X 5 MM MISC Use as directed 06/12/14  Yes Tresa Garter, MD  metFORMIN (GLUCOPHAGE) 500 MG tablet Take 1 tablet (500 mg total) by mouth 2 (two) times daily with a meal. 12/04/14  Yes Tresa Garter, MD  Tiotropium Bromide Monohydrate (SPIRIVA RESPIMAT) 2.5 MCG/ACT AERS 2 puffs each am 12/04/14  Yes Tresa Garter, MD  acetaminophen-codeine (TYLENOL #3) 300-30 MG per tablet Take 1 tablet by mouth every 4 (four) hours as needed. 12/04/14   Tresa Garter, MD  albuterol (PROVENTIL HFA;VENTOLIN HFA) 108 (  90 BASE) MCG/ACT inhaler Inhale 2 puffs into the lungs every 4 (four) hours as needed for wheezing or shortness of breath (((PLAN B))). 12/04/14   Tresa Garter, MD  benzonatate (TESSALON) 100 MG capsule Take 1 capsule (100 mg total) by mouth every 8 (eight) hours. Patient not taking: Reported on 12/04/2014 11/27/14   Tatyana Kirichenko, PA-C    fluconazole (DIFLUCAN) 200 MG tablet Take 1 tablet (200 mg total) by mouth once. Take 1 tablet (200 mg total) by mouth daily for 7 days Patient not taking: Reported on 11/27/2014 08/15/14   Tresa Garter, MD  insulin aspart (NOVOLOG) 100 UNIT/ML injection Inject 0-20 Units into the skin 3 (three) times daily with meals. CBG 70 - 120: 0 units CBG 121 - 150: 3 units CBG 151 - 200: 4 units CBG 201 - 250: 7 units CBG 251 - 300: 11 units CBG 301 - 350: 15 units CBG 351 - 400: 20 units Patient not taking: Reported on 11/27/2014 01/17/14   Tresa Garter, MD  levofloxacin (LEVAQUIN) 750 MG tablet Take 1 tablet (750 mg total) by mouth daily. Patient not taking: Reported on 12/04/2014 11/27/14   Tatyana Kirichenko, PA-C  lisinopril (PRINIVIL) 10 MG tablet Take 1 tablet (10 mg total) by mouth daily. 12/04/14   Tresa Garter, MD  oseltamivir (TAMIFLU) 75 MG capsule Take 1 capsule (75 mg total) by mouth every 12 (twelve) hours. Patient not taking: Reported on 12/04/2014 11/27/14   Tatyana Kirichenko, PA-C  predniSONE (DELTASONE) 10 MG tablet Take 5 tab day 1, take 4 tab day 2, take 3 tab day 3, take 2 tab day 4, and take 1 tab day 5 Patient not taking: Reported on 12/04/2014 11/27/14   Lahoma Rocker Kirichenko, PA-C  triamcinolone cream (KENALOG) 0.1 % Apply 1 application topically 2 (two) times daily. 12/04/14   Tresa Garter, MD     Objective:   Filed Vitals:   12/04/14 1127  BP: 128/71  Pulse: 96  Temp: 98.7 F (37.1 C)  TempSrc: Oral  Resp: 16  Height: '5\' 9"'  (1.753 m)  Weight: 271 lb (122.925 kg)  SpO2: 87%    Exam General appearance : Awake, alert, not in any distress. Speech Clear. Not toxic looking, obese HEENT: loud snorting, Atraumatic and Normocephalic, pupils equally reactive to light and accomodation Neck: supple, no JVD. No cervical lymphadenopathy.  Chest:Good air entry bilaterally, no added sounds  CVS: S1 S2 regular, no murmurs.  Abdomen: Bowel sounds present, Non  tender and not distended with no gaurding, rigidity or rebound. Extremities: B/L Lower Ext shows no edema, both legs are warm to touch Neurology: Awake alert, and oriented X 3, CN II-XII intact, Non focal   Data Review Lab Results  Component Value Date   HGBA1C 8.0 09/28/2014   HGBA1C 6.5 04/20/2014   HGBA1C 8.5* 12/12/2013     Assessment & Plan   1. Type 2 diabetes mellitus without complication  - Glucose (CBG) - metFORMIN (GLUCOPHAGE) 500 MG tablet; Take 1 tablet (500 mg total) by mouth 2 (two) times daily with a meal.  Dispense: 180 tablet; Refill: 3  Aim for 30 minutes of exercise most days. Rethink what you drink. Water is great! Aim for 2-3 Carb Choices per meal (30-45 grams) +/- 1 either way  Aim for 0-15 Carbs per snack if hungry  Include protein in moderation with your meals and snacks  Consider reading food labels for Total Carbohydrate and Fat Grams of foods  Consider checking BG  at alternate times per day  Continue taking medication as directed Be mindful about how much sugar you are adding to beverages and other foods. Fruit Punch - find one with no sugar  Measure and decrease portions of carbohydrate foods  Make your plate and don't go back for seconds   2. COPD exacerbation  - albuterol (PROVENTIL HFA;VENTOLIN HFA) 108 (90 BASE) MCG/ACT inhaler; Inhale 2 puffs into the lungs every 4 (four) hours as needed for wheezing or shortness of breath (((PLAN B))).  Dispense: 3 Inhaler; Refill: 3 - budesonide-formoterol (SYMBICORT) 160-4.5 MCG/ACT inhaler; Inhale 2 puffs into the lungs 2 (two) times daily.  Dispense: 10.2 g; Refill: 3 - Tiotropium Bromide Monohydrate (SPIRIVA RESPIMAT) 2.5 MCG/ACT AERS; 2 puffs each am  Dispense: 1 Inhaler; Refill: 3  3. Essential hypertension, benign  - lisinopril (PRINIVIL) 10 MG tablet; Take 1 tablet (10 mg total) by mouth daily.  Dispense: 30 tablet; Refill: 11  We have discussed target BP range and blood pressure goal. I have  advised patient to check BP regularly and to call us back or report to clinic if the numbers are consistently higher than 140/90. We discussed the importance of compliance with medical therapy and DASH diet recommended, consequences of uncontrolled hypertension discussed.  - continue current BP medications  4. Chronic Pain  - acetaminophen-codeine (TYLENOL #3) 300-30 MG per tablet; Take 1 tablet by mouth every 4 (four) hours as needed.  Dispense: 90 tablet; Refill: 0  - Chris [name] was counseled on the dangers of tobacco use, and was advised to quit. Reviewed strategies to maximize success, including removing cigarettes and smoking materials from environment, stress management and support of family/friends.   5. Atopic eczema  - triamcinolone cream (KENALOG) 0.1 %; Apply 1 application topically 2 (two) times daily.  Dispense: 30 g; Refill: 0  Patient have been counseled extensively about nutrition and exercise Return in about 4 weeks (around 01/01/2015), or if symptoms worsen or fail to improve, for Hemoglobin A1C and Follow up, DM, Follow up HTN, COPD.  The patient was given clear instructions to go to ER or return to medical center if symptoms don't improve, worsen or new problems develop. The patient verbalized understanding. The patient was told to call to get lab results if they haven't heard anything in the next week.   This note has been created with Surveyor, quantity. Any transcriptional errors are unintentional.    Angelica Chessman, MD, [place], [place], [place], [place] and [place], [place]   12/04/2014, 12:01 PM

## 2014-12-06 ENCOUNTER — Encounter (HOSPITAL_COMMUNITY): Payer: Self-pay | Admitting: Emergency Medicine

## 2014-12-06 ENCOUNTER — Emergency Department (HOSPITAL_COMMUNITY): Payer: Medicaid Other

## 2014-12-06 ENCOUNTER — Inpatient Hospital Stay (HOSPITAL_COMMUNITY)
Admission: EM | Admit: 2014-12-06 | Discharge: 2014-12-08 | DRG: 974 | Disposition: A | Discharge status: Home / Self Care | Payer: Medicaid Other | Attending: Internal Medicine | Admitting: Internal Medicine

## 2014-12-06 DIAGNOSIS — R0902 Hypoxemia: Secondary | ICD-10-CM

## 2014-12-06 DIAGNOSIS — R0609 Other forms of dyspnea: Secondary | ICD-10-CM

## 2014-12-06 DIAGNOSIS — J449 Chronic obstructive pulmonary disease, unspecified: Secondary | ICD-10-CM

## 2014-12-06 DIAGNOSIS — I10 Essential (primary) hypertension: Secondary | ICD-10-CM | POA: Diagnosis present

## 2014-12-06 DIAGNOSIS — J189 Pneumonia, unspecified organism: Secondary | ICD-10-CM

## 2014-12-06 DIAGNOSIS — E669 Obesity, unspecified: Secondary | ICD-10-CM | POA: Diagnosis present

## 2014-12-06 DIAGNOSIS — R059 Cough, unspecified: Secondary | ICD-10-CM

## 2014-12-06 DIAGNOSIS — Z6841 Body Mass Index (BMI) 40.0 and over, adult: Secondary | ICD-10-CM

## 2014-12-06 DIAGNOSIS — J9620 Acute and chronic respiratory failure, unspecified whether with hypoxia or hypercapnia: Secondary | ICD-10-CM | POA: Diagnosis present

## 2014-12-06 DIAGNOSIS — B20 Human immunodeficiency virus [HIV] disease: Secondary | ICD-10-CM | POA: Diagnosis present

## 2014-12-06 DIAGNOSIS — J9621 Acute and chronic respiratory failure with hypoxia: Secondary | ICD-10-CM | POA: Diagnosis present

## 2014-12-06 DIAGNOSIS — Z9981 Dependence on supplemental oxygen: Secondary | ICD-10-CM

## 2014-12-06 DIAGNOSIS — F17210 Nicotine dependence, cigarettes, uncomplicated: Secondary | ICD-10-CM | POA: Diagnosis present

## 2014-12-06 DIAGNOSIS — J441 Chronic obstructive pulmonary disease with (acute) exacerbation: Secondary | ICD-10-CM | POA: Diagnosis present

## 2014-12-06 DIAGNOSIS — Z79899 Other long term (current) drug therapy: Secondary | ICD-10-CM

## 2014-12-06 DIAGNOSIS — R05 Cough: Secondary | ICD-10-CM

## 2014-12-06 DIAGNOSIS — Z794 Long term (current) use of insulin: Secondary | ICD-10-CM

## 2014-12-06 DIAGNOSIS — E119 Type 2 diabetes mellitus without complications: Secondary | ICD-10-CM

## 2014-12-06 NOTE — ED Notes (Signed)
Pt. reports persistent productive cough / exertional dyspnea for several week , denies fever or chills. Pt. states history of COPD / heavy smoker.

## 2014-12-07 DIAGNOSIS — Z720 Tobacco use: Secondary | ICD-10-CM

## 2014-12-07 DIAGNOSIS — E669 Obesity, unspecified: Secondary | ICD-10-CM | POA: Diagnosis present

## 2014-12-07 DIAGNOSIS — J449 Chronic obstructive pulmonary disease, unspecified: Secondary | ICD-10-CM

## 2014-12-07 DIAGNOSIS — B20 Human immunodeficiency virus [HIV] disease: Secondary | ICD-10-CM

## 2014-12-07 DIAGNOSIS — Z794 Long term (current) use of insulin: Secondary | ICD-10-CM

## 2014-12-07 DIAGNOSIS — I10 Essential (primary) hypertension: Secondary | ICD-10-CM | POA: Diagnosis present

## 2014-12-07 DIAGNOSIS — Z9981 Dependence on supplemental oxygen: Secondary | ICD-10-CM | POA: Diagnosis not present

## 2014-12-07 DIAGNOSIS — R6882 Decreased libido: Secondary | ICD-10-CM | POA: Diagnosis not present

## 2014-12-07 DIAGNOSIS — E119 Type 2 diabetes mellitus without complications: Secondary | ICD-10-CM | POA: Diagnosis present

## 2014-12-07 DIAGNOSIS — E118 Type 2 diabetes mellitus with unspecified complications: Secondary | ICD-10-CM

## 2014-12-07 DIAGNOSIS — J9621 Acute and chronic respiratory failure with hypoxia: Secondary | ICD-10-CM

## 2014-12-07 DIAGNOSIS — Z21 Asymptomatic human immunodeficiency virus [HIV] infection status: Secondary | ICD-10-CM

## 2014-12-07 DIAGNOSIS — R05 Cough: Secondary | ICD-10-CM | POA: Diagnosis not present

## 2014-12-07 DIAGNOSIS — J189 Pneumonia, unspecified organism: Secondary | ICD-10-CM | POA: Diagnosis present

## 2014-12-07 DIAGNOSIS — F17210 Nicotine dependence, cigarettes, uncomplicated: Secondary | ICD-10-CM | POA: Diagnosis present

## 2014-12-07 DIAGNOSIS — Z79899 Other long term (current) drug therapy: Secondary | ICD-10-CM | POA: Diagnosis not present

## 2014-12-07 DIAGNOSIS — J441 Chronic obstructive pulmonary disease with (acute) exacerbation: Secondary | ICD-10-CM

## 2014-12-07 DIAGNOSIS — Z6841 Body Mass Index (BMI) 40.0 and over, adult: Secondary | ICD-10-CM | POA: Diagnosis not present

## 2014-12-07 LAB — CBC WITH DIFFERENTIAL/PLATELET
BASOS ABS: 0 10*3/uL (ref 0.0–0.1)
BASOS PCT: 0 % (ref 0–1)
EOS PCT: 2 % (ref 0–5)
Eosinophils Absolute: 0.1 10*3/uL (ref 0.0–0.7)
HCT: 45.2 % (ref 39.0–52.0)
HEMOGLOBIN: 14.4 g/dL (ref 13.0–17.0)
Lymphocytes Relative: 37 % (ref 12–46)
Lymphs Abs: 2.4 10*3/uL (ref 0.7–4.0)
MCH: 29.3 pg (ref 26.0–34.0)
MCHC: 31.9 g/dL (ref 30.0–36.0)
MCV: 91.9 fL (ref 78.0–100.0)
Monocytes Absolute: 0.8 10*3/uL (ref 0.1–1.0)
Monocytes Relative: 13 % — ABNORMAL HIGH (ref 3–12)
NEUTROS ABS: 3.2 10*3/uL (ref 1.7–7.7)
Neutrophils Relative %: 48 % (ref 43–77)
Platelets: 217 10*3/uL (ref 150–400)
RBC: 4.92 MIL/uL (ref 4.22–5.81)
RDW: 12.9 % (ref 11.5–15.5)
WBC: 6.7 10*3/uL (ref 4.0–10.5)

## 2014-12-07 LAB — GLUCOSE, CAPILLARY
GLUCOSE-CAPILLARY: 190 mg/dL — AB (ref 70–99)
GLUCOSE-CAPILLARY: 218 mg/dL — AB (ref 70–99)
Glucose-Capillary: 283 mg/dL — ABNORMAL HIGH (ref 70–99)

## 2014-12-07 LAB — BASIC METABOLIC PANEL
ANION GAP: 8 (ref 5–15)
BUN: 15 mg/dL (ref 6–23)
CHLORIDE: 103 mmol/L (ref 96–112)
CO2: 25 mmol/L (ref 19–32)
Calcium: 8.5 mg/dL (ref 8.4–10.5)
Creatinine, Ser: 1.09 mg/dL (ref 0.50–1.35)
GFR calc Af Amer: >90 mL/min (ref >90)
GFR calc non Af Amer: 79 mL/min — ABNORMAL LOW (ref >90)
Glucose, Bld: 107 mg/dL — ABNORMAL HIGH (ref 70–99)
Potassium: 4.2 mmol/L (ref 3.5–5.1)
Sodium: 136 mmol/L (ref 135–145)

## 2014-12-07 LAB — STREP PNEUMONIAE URINARY ANTIGEN: STREP PNEUMO URINARY ANTIGEN: NEGATIVE

## 2014-12-07 MED ORDER — SODIUM CHLORIDE 0.9 % IJ SOLN
3.0000 mL | Freq: Two times a day (BID) | INTRAMUSCULAR | Status: DC
  Administered 2014-12-07: 3 mL via INTRAVENOUS

## 2014-12-07 MED ORDER — DARUNAVIR ETHANOLATE 800 MG PO TABS
800.0000 mg | ORAL_TABLET | Freq: Every day | ORAL | Status: DC
  Administered 2014-12-07 – 2014-12-08 (×2): 800 mg via ORAL
  Filled 2014-12-07 (×3): qty 1

## 2014-12-07 MED ORDER — BUDESONIDE-FORMOTEROL FUMARATE 160-4.5 MCG/ACT IN AERO
2.0000 | INHALATION_SPRAY | Freq: Two times a day (BID) | RESPIRATORY_TRACT | Status: DC
  Administered 2014-12-07 – 2014-12-08 (×2): 2 via RESPIRATORY_TRACT
  Filled 2014-12-07: qty 6

## 2014-12-07 MED ORDER — SODIUM CHLORIDE 0.9 % IV BOLUS (SEPSIS)
500.0000 mL | Freq: Once | INTRAVENOUS | Status: AC
  Administered 2014-12-07: 500 mL via INTRAVENOUS

## 2014-12-07 MED ORDER — ALBUTEROL SULFATE (2.5 MG/3ML) 0.083% IN NEBU: 2.5000 mg | INHALATION_SOLUTION | RESPIRATORY_TRACT | Status: AC | PRN

## 2014-12-07 MED ORDER — INSULIN ASPART 100 UNIT/ML ~~LOC~~ SOLN
0.0000 [IU] | Freq: Every day | SUBCUTANEOUS | Status: DC
  Administered 2014-12-08: 2 [IU] via SUBCUTANEOUS

## 2014-12-07 MED ORDER — TIOTROPIUM BROMIDE MONOHYDRATE 18 MCG IN CAPS
18.0000 ug | ORAL_CAPSULE | Freq: Every day | RESPIRATORY_TRACT | Status: DC
  Administered 2014-12-07: 18 ug via RESPIRATORY_TRACT
  Filled 2014-12-07: qty 5

## 2014-12-07 MED ORDER — DEXTROSE 5 % IV SOLN
1.0000 g | Freq: Once | INTRAVENOUS | Status: AC
  Administered 2014-12-07: 1 g via INTRAVENOUS
  Filled 2014-12-07: qty 10

## 2014-12-07 MED ORDER — ALUM & MAG HYDROXIDE-SIMETH 200-200-20 MG/5ML PO SUSP: 30.0000 mL | Freq: Four times a day (QID) | ORAL | Status: DC | PRN

## 2014-12-07 MED ORDER — HYDROMORPHONE HCL 1 MG/ML IJ SOLN: 0.5000 mg | INTRAMUSCULAR | Status: DC | PRN

## 2014-12-07 MED ORDER — DAPSONE 100 MG PO TABS
100.0000 mg | ORAL_TABLET | Freq: Every day | ORAL | Status: DC
  Administered 2014-12-07 – 2014-12-08 (×2): 100 mg via ORAL
  Filled 2014-12-07 (×2): qty 1

## 2014-12-07 MED ORDER — PIPERACILLIN-TAZOBACTAM 3.375 G IVPB 30 MIN
3.3750 g | Freq: Once | INTRAVENOUS | Status: AC
  Administered 2014-12-07: 3.375 g via INTRAVENOUS
  Filled 2014-12-07: qty 50

## 2014-12-07 MED ORDER — ELVITEG-COBIC-EMTRICIT-TENOFAF 150-150-200-10 MG PO TABS
1.0000 | ORAL_TABLET | Freq: Every day | ORAL | Status: DC
  Administered 2014-12-07 – 2014-12-08 (×2): 1 via ORAL
  Filled 2014-12-07 (×3): qty 1

## 2014-12-07 MED ORDER — DEXTROSE 5 % IV SOLN
500.0000 mg | Freq: Once | INTRAVENOUS | Status: AC
  Administered 2014-12-07: 500 mg via INTRAVENOUS
  Filled 2014-12-07: qty 500

## 2014-12-07 MED ORDER — IPRATROPIUM-ALBUTEROL 0.5-2.5 (3) MG/3ML IN SOLN
3.0000 mL | RESPIRATORY_TRACT | Status: DC
  Administered 2014-12-07 (×3): 3 mL via RESPIRATORY_TRACT
  Filled 2014-12-07 (×4): qty 3

## 2014-12-07 MED ORDER — INSULIN ASPART 100 UNIT/ML ~~LOC~~ SOLN
0.0000 [IU] | Freq: Three times a day (TID) | SUBCUTANEOUS | Status: DC
  Administered 2014-12-07: 2 [IU] via SUBCUTANEOUS
  Administered 2014-12-07 (×2): 5 [IU] via SUBCUTANEOUS
  Administered 2014-12-08: 3 [IU] via SUBCUTANEOUS

## 2014-12-07 MED ORDER — OXYCODONE HCL 5 MG PO TABS: 5.0000 mg | ORAL_TABLET | ORAL | Status: DC | PRN

## 2014-12-07 MED ORDER — ACETAMINOPHEN 650 MG RE SUPP: 650.0000 mg | Freq: Four times a day (QID) | RECTAL | Status: DC | PRN

## 2014-12-07 MED ORDER — VANCOMYCIN HCL IN DEXTROSE 1-5 GM/200ML-% IV SOLN
1000.0000 mg | Freq: Once | INTRAVENOUS | Status: AC
  Administered 2014-12-07: 1000 mg via INTRAVENOUS
  Filled 2014-12-07: qty 200

## 2014-12-07 MED ORDER — VANCOMYCIN HCL 10 G IV SOLR
1250.0000 mg | Freq: Three times a day (TID) | INTRAVENOUS | Status: DC
  Administered 2014-12-07 – 2014-12-08 (×3): 1250 mg via INTRAVENOUS
  Filled 2014-12-07 (×6): qty 1250

## 2014-12-07 MED ORDER — INSULIN GLARGINE 100 UNIT/ML ~~LOC~~ SOLN
10.0000 [IU] | Freq: Every day | SUBCUTANEOUS | Status: DC
  Administered 2014-12-08: 10 [IU] via SUBCUTANEOUS
  Filled 2014-12-07 (×2): qty 0.1

## 2014-12-07 MED ORDER — PIPERACILLIN-TAZOBACTAM 3.375 G IVPB
3.3750 g | Freq: Three times a day (TID) | INTRAVENOUS | Status: DC
  Administered 2014-12-07 – 2014-12-08 (×3): 3.375 g via INTRAVENOUS
  Filled 2014-12-07 (×6): qty 50

## 2014-12-07 MED ORDER — ONDANSETRON HCL 4 MG/2ML IJ SOLN: 4.0000 mg | Freq: Four times a day (QID) | INTRAMUSCULAR | Status: DC | PRN

## 2014-12-07 MED ORDER — ONDANSETRON HCL 4 MG PO TABS: 4.0000 mg | ORAL_TABLET | Freq: Four times a day (QID) | ORAL | Status: DC | PRN

## 2014-12-07 MED ORDER — SODIUM CHLORIDE 0.9 % IV SOLN
INTRAVENOUS | Status: DC
  Administered 2014-12-07: 04:00:00 via INTRAVENOUS

## 2014-12-07 MED ORDER — LISINOPRIL 10 MG PO TABS
10.0000 mg | ORAL_TABLET | Freq: Every day | ORAL | Status: DC
  Administered 2014-12-07 – 2014-12-08 (×2): 10 mg via ORAL
  Filled 2014-12-07 (×2): qty 1

## 2014-12-07 MED ORDER — ENOXAPARIN SODIUM 40 MG/0.4ML ~~LOC~~ SOLN
40.0000 mg | SUBCUTANEOUS | Status: DC
  Administered 2014-12-07: 40 mg via SUBCUTANEOUS
  Filled 2014-12-07 (×2): qty 0.4

## 2014-12-07 MED ORDER — ACETAMINOPHEN 325 MG PO TABS: 650.0000 mg | ORAL_TABLET | Freq: Four times a day (QID) | ORAL | Status: DC | PRN

## 2014-12-07 MED ORDER — METHYLPREDNISOLONE SODIUM SUCC 125 MG IJ SOLR
125.0000 mg | Freq: Two times a day (BID) | INTRAMUSCULAR | Status: AC
  Administered 2014-12-07 (×2): 125 mg via INTRAVENOUS
  Filled 2014-12-07 (×2): qty 2

## 2014-12-07 MED ORDER — ALBUTEROL SULFATE (2.5 MG/3ML) 0.083% IN NEBU
5.0000 mg | INHALATION_SOLUTION | Freq: Once | RESPIRATORY_TRACT | Status: AC
  Administered 2014-12-07: 5 mg via RESPIRATORY_TRACT
  Filled 2014-12-07: qty 6

## 2014-12-07 MED ORDER — IPRATROPIUM-ALBUTEROL 0.5-2.5 (3) MG/3ML IN SOLN
3.0000 mL | Freq: Three times a day (TID) | RESPIRATORY_TRACT | Status: DC
  Administered 2014-12-08: 3 mL via RESPIRATORY_TRACT
  Filled 2014-12-07: qty 3

## 2014-12-07 MED ORDER — VANCOMYCIN HCL 10 G IV SOLR
1250.0000 mg | Freq: Once | INTRAVENOUS | Status: AC
  Administered 2014-12-07: 1250 mg via INTRAVENOUS
  Filled 2014-12-07: qty 1250
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
## 2014-12-07 NOTE — Progress Notes (Signed)
ANTIBIOTIC CONSULT NOTE - INITIAL  Pharmacy Consult for Vancocin and Zosyn Indication: rule out pneumonia  Allergies  Allergen Reactions  . Bactrim [Sulfamethoxazole-Trimethoprim] Hives    Patient Measurements: Height: '5\' 9"'  (175.3 cm) Weight: 271 lb (122.925 kg) IBW/kg (Calculated) : 70.7  Vital Signs: Temp: 98 F (36.7 C) (04/27 2009) BP: 119/65 mmHg (04/28 0200) Pulse Rate: 93 (04/28 0200)  Labs:  Recent Labs  12/07/14 0016  WBC 6.7  HGB 14.4  PLT 217  CREATININE 1.09   Estimated Creatinine Clearance: 107.4 mL/min (by C-G formula based on Cr of 1.09).  Medical History: Past Medical History  Diagnosis Date  . COPD (chronic obstructive pulmonary disease)   . HIV disease   . Emphysema   . Diabetes mellitus without complication     Medications:  Prescriptions prior to admission  Medication Sig Dispense Refill Last Dose  . acetaminophen-codeine (TYLENOL #3) 300-30 MG per tablet Take 1 tablet by mouth every 4 (four) hours as needed. 90 tablet 0 12/06/2014 at Unknown time  . albuterol (PROVENTIL HFA;VENTOLIN HFA) 108 (90 BASE) MCG/ACT inhaler Inhale 2 puffs into the lungs every 4 (four) hours as needed for wheezing or shortness of breath (((PLAN B))). 3 Inhaler 3 12/06/2014 at Unknown time  . albuterol (PROVENTIL) (5 MG/ML) 0.5% nebulizer solution Take 0.5 mLs (2.5 mg total) by nebulization every 4 (four) hours as needed for wheezing or shortness of breath (((PLAN C))). (Patient taking differently: Take 2.5 mg by nebulization daily. ) 20 mL 3 12/06/2014 at Unknown time  . budesonide-formoterol (SYMBICORT) 160-4.5 MCG/ACT inhaler Inhale 2 puffs into the lungs 2 (two) times daily. 10.2 g 3 12/06/2014 at Unknown time  . dapsone 100 MG tablet Take 1 tablet (100 mg total) by mouth daily. 30 tablet 11 12/06/2014 at Unknown time  . Darunavir Ethanolate (PREZISTA) 800 MG tablet Take 1 tablet (800 mg total) by mouth daily with breakfast. 30 tablet 11 12/06/2014 at Unknown time  .  elvitegravir-cobicistat-emtricitabine-tenofovir (STRIBILD) 150-150-200-300 MG TABS tablet Take 1 tablet by mouth daily with breakfast. 30 tablet 11 12/06/2014 at Unknown time  . insulin glargine (LANTUS) 100 UNIT/ML injection Inject 0.1 mLs (10 Units total) into the skin at bedtime. 10 mL 11 12/06/2014 at Unknown time  . lisinopril (PRINIVIL) 10 MG tablet Take 1 tablet (10 mg total) by mouth daily. 30 tablet 11 12/06/2014 at Unknown time  . metFORMIN (GLUCOPHAGE) 500 MG tablet Take 1 tablet (500 mg total) by mouth 2 (two) times daily with a meal. 180 tablet 3 12/06/2014 at Unknown time  . Tiotropium Bromide Monohydrate (SPIRIVA RESPIMAT) 2.5 MCG/ACT AERS 2 puffs each am (Patient taking differently: Take 2 puffs by mouth every morning. ) 1 Inhaler 3 12/06/2014 at Unknown time  . triamcinolone cream (KENALOG) 0.1 % Apply 1 application topically 2 (two) times daily. 30 g 0 12/06/2014 at Unknown time  . benzonatate (TESSALON) 100 MG capsule Take 1 capsule (100 mg total) by mouth every 8 (eight) hours. (Patient not taking: Reported on 12/04/2014) 21 capsule 0 Not Taking at Unknown time  . Blood Glucose Monitoring Suppl (ACCU-CHEK AVIVA PLUS) W/DEVICE KIT 250 Test up to 4 times daily 1 kit 0 Taking  . fluconazole (DIFLUCAN) 200 MG tablet Take 1 tablet (200 mg total) by mouth once. Take 1 tablet (200 mg total) by mouth daily for 7 days (Patient not taking: Reported on 11/27/2014) 7 tablet 0 Not Taking at Unknown time  . glucose blood test strip Use as instructed 100 each 12 Taking  .  insulin aspart (NOVOLOG) 100 UNIT/ML injection Inject 0-20 Units into the skin 3 (three) times daily with meals. CBG 70 - 120: 0 units CBG 121 - 150: 3 units CBG 151 - 200: 4 units CBG 201 - 250: 7 units CBG 251 - 300: 11 units CBG 301 - 350: 15 units CBG 351 - 400: 20 units (Patient not taking: Reported on 11/27/2014) 3 vial 3 Not Taking at Unknown time  . Insulin Pen Needle 32G X 5 MM MISC Use as directed 100 each 11 Taking  .  levofloxacin (LEVAQUIN) 750 MG tablet Take 1 tablet (750 mg total) by mouth daily. (Patient not taking: Reported on 12/04/2014) 5 tablet 0 Completed Course at Unknown time  . oseltamivir (TAMIFLU) 75 MG capsule Take 1 capsule (75 mg total) by mouth every 12 (twelve) hours. (Patient not taking: Reported on 12/04/2014) 10 capsule 0 Completed Course at Unknown time  . predniSONE (DELTASONE) 10 MG tablet Take 5 tab day 1, take 4 tab day 2, take 3 tab day 3, take 2 tab day 4, and take 1 tab day 5 (Patient not taking: Reported on 12/04/2014) 15 tablet 0 Completed Course at Unknown time   Scheduled:  . insulin aspart  0-5 Units Subcutaneous QHS  . insulin aspart  0-9 Units Subcutaneous TID WC  . ipratropium-albuterol  3 mL Nebulization Q4H  . methylPREDNISolone (SOLU-MEDROL) injection  125 mg Intravenous Q12H  . piperacillin-tazobactam  3.375 g Intravenous Once  . vancomycin  1,000 mg Intravenous Once    Assessment: 49yo male c/o persistent productive cough and exertional dyspnea for several weeks, known h/o COPD and heavy tobacco abuse, was recently on Levaquin and Tamiflu, CXR shows airspace opacity concerning for atelectasis vs PNA, to begin IV ABX.  Goal of Therapy:  Vancomycin trough level 15-20 mcg/ml  Plan:  Rec'd vanc 1g and Zosyn 3.375g IV in ED; will give additional vancomycin 1233m IV for total load of 2.25g followed by 12576mIV Q8H as well as Zosyn 3.375g IV Q8H and monitor CBC, Cx, levels prn.  VeWynona NeatPharmD, BCPS  12/07/2014,3:35 AM

## 2014-12-07 NOTE — Progress Notes (Addendum)
Patient seen and examined  Agree with documented assessment and plan by  Chris ParkerHarvette C Jenkins, MD  Plan COPD exacerbation Continue Solu-Medrol, broad-spectrum antibiotics Patient is a acute hypoxemic respiratory failure and is chronically O2 dependent at home on 2 L  HIV Check CD4 count- Last CD4 =200 on 02/11/2-16, with Viral load < 20,Continue HAART Rx   Nicotine dependence Patient not interested in quitting at this time  Diabetes mellitus insulin-dependent Continue Lantus and SSI, last hemoglobin A1c was 8.0  Hypertension continue with lisinopril

## 2014-12-07 NOTE — ED Notes (Signed)
Family at bedside. 
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
## 2014-12-07 NOTE — Progress Notes (Signed)
Utilization Review Completed.Chris [name] T4/28/2016  

## 2014-12-08 LAB — COMPREHENSIVE METABOLIC PANEL
ALT: 32 U/L (ref 0–53)
ANION GAP: 6 (ref 5–15)
AST: 19 U/L (ref 0–37)
Albumin: 3.3 g/dL — ABNORMAL LOW (ref 3.5–5.2)
Alkaline Phosphatase: 36 U/L — ABNORMAL LOW (ref 39–117)
BUN: 11 mg/dL (ref 6–23)
CHLORIDE: 104 mmol/L (ref 96–112)
CO2: 26 mmol/L (ref 19–32)
Calcium: 8.7 mg/dL (ref 8.4–10.5)
Creatinine, Ser: 0.96 mg/dL (ref 0.50–1.35)
GFR calc non Af Amer: >90 mL/min (ref >90)
Glucose, Bld: 216 mg/dL — ABNORMAL HIGH (ref 70–99)
POTASSIUM: 5 mmol/L (ref 3.5–5.1)
Sodium: 136 mmol/L (ref 135–145)
Total Bilirubin: 0.6 mg/dL (ref 0.3–1.2)
Total Protein: 8.3 g/dL (ref 6.0–8.3)

## 2014-12-08 LAB — HEMOGLOBIN A1C
Hgb A1c MFr Bld: 6 % — ABNORMAL HIGH (ref 4.8–5.6)
MEAN PLASMA GLUCOSE: 126 mg/dL

## 2014-12-08 LAB — GLUCOSE, CAPILLARY: GLUCOSE-CAPILLARY: 204 mg/dL — AB (ref 70–99)

## 2014-12-08 LAB — CBC
HEMATOCRIT: 42.6 % (ref 39.0–52.0)
HEMOGLOBIN: 13.4 g/dL (ref 13.0–17.0)
MCH: 28.8 pg (ref 26.0–34.0)
MCHC: 31.5 g/dL (ref 30.0–36.0)
MCV: 91.6 fL (ref 78.0–100.0)
PLATELETS: 233 10*3/uL (ref 150–400)
RBC: 4.65 MIL/uL (ref 4.22–5.81)
RDW: 12.9 % (ref 11.5–15.5)
WBC: 15.5 10*3/uL — ABNORMAL HIGH (ref 4.0–10.5)

## 2014-12-08 LAB — T-HELPER CELLS (CD4) COUNT (NOT AT ARMC)
CD4 % Helper T Cell: 9 % — ABNORMAL LOW (ref 33–55)
CD4 T CELL ABS: 190 /uL — AB (ref 400–2700)

## 2014-12-08 LAB — LEGIONELLA ANTIGEN, URINE

## 2014-12-08 MED ORDER — LEVOFLOXACIN 750 MG PO TABS: 750.0000 mg | ORAL_TABLET | Freq: Every day | ORAL | Status: DC

## 2014-12-08 MED ORDER — PREDNISONE 5 MG PO TABS: ORAL_TABLET | ORAL | Status: DC

## 2014-12-08 MED ORDER — HYDROCODONE-HOMATROPINE 5-1.5 MG/5ML PO SYRP: 5.0000 mL | ORAL_SOLUTION | Freq: Four times a day (QID) | ORAL | Status: DC | PRN

## 2014-12-08 MED ORDER — BENZONATATE 100 MG PO CAPS: 100.0000 mg | ORAL_CAPSULE | Freq: Three times a day (TID) | ORAL | Status: DC

## 2014-12-08 NOTE — Progress Notes (Signed)
Pt discharged via wheelchair home with wife. Pt has no c/o pain and no SOB. Very adamant about leaving ASAP. Educated pt and went over discharge papers. Pt on 2L with wife bringing home o2 to front door. IV removed. Pt tolerated well. Jillyn HiddenStone,Jayshawn Colston R, RN

## 2014-12-08 NOTE — Discharge Summary (Signed)
Physician Discharge Summary  Chris [name] MRN: [id number] DOB/AGE: 1966-05-20 49 y.o.  PCP: Angelica Chessman, MD   Admit date: 12/06/2014 Discharge date: 12/08/2014  Discharge Diagnoses:     Active Problems:   HIV disease   COPD  GOLD III, still smoking   DM (diabetes mellitus)   Smoker   COPD exacerbation   Acute on chronic respiratory failure   Essential hypertension, benign   HCAP (healthcare-associated pneumonia)   Follow-up recommendations Follow-up with PCP in 3-5 days Follow-up CBC, CMP in 3-5 days Follow-up with infectious disease Dr. Graylon Good in 1-2 weeks      Medication List    STOP taking these medications        oseltamivir 75 MG capsule  Commonly known as:  TAMIFLU     predniSONE 10 MG tablet  Commonly known as:  DELTASONE      TAKE these medications        ACCU-CHEK AVIVA PLUS W/DEVICE Kit  - 250  - Test up to 4 times daily     acetaminophen-codeine 300-30 MG per tablet  Commonly known as:  TYLENOL #3  Take 1 tablet by mouth every 4 (four) hours as needed.     albuterol (5 MG/ML) 0.5% nebulizer solution  Commonly known as:  PROVENTIL  Take 0.5 mLs (2.5 mg total) by nebulization every 4 (four) hours as needed for wheezing or shortness of breath (((PLAN C))).     albuterol 108 (90 BASE) MCG/ACT inhaler  Commonly known as:  PROVENTIL HFA;VENTOLIN HFA  Inhale 2 puffs into the lungs every 4 (four) hours as needed for wheezing or shortness of breath (((PLAN B))).     benzonatate 100 MG capsule  Commonly known as:  TESSALON  Take 1 capsule (100 mg total) by mouth every 8 (eight) hours.     budesonide-formoterol 160-4.5 MCG/ACT inhaler  Commonly known as:  SYMBICORT  Inhale 2 puffs into the lungs 2 (two) times daily.     dapsone 100 MG tablet  Take 1 tablet (100 mg total) by mouth daily.     Darunavir Ethanolate 800 MG tablet  Commonly known as:  PREZISTA  Take 1 tablet (800 mg total) by mouth daily with breakfast.      elvitegravir-cobicistat-emtricitabine-tenofovir 150-150-200-300 MG Tabs tablet  Commonly known as:  STRIBILD  Take 1 tablet by mouth daily with breakfast.     fluconazole 200 MG tablet  Commonly known as:  DIFLUCAN  Take 1 tablet (200 mg total) by mouth once. Take 1 tablet (200 mg total) by mouth daily for 7 days     glucose blood test strip  Use as instructed     HYDROcodone-homatropine 5-1.5 MG/5ML syrup  Commonly known as:  HYCODAN  Take 5 mLs by mouth every 6 (six) hours as needed for cough.     insulin aspart 100 UNIT/ML injection  Commonly known as:  novoLOG  - Inject 0-20 Units into the skin 3 (three) times daily with meals. CBG 70 - 120: 0 units  - CBG 121 - 150: 3 units  - CBG 151 - 200: 4 units  - CBG 201 - 250: 7 units  - CBG 251 - 300: 11 units  - CBG 301 - 350: 15 units  - CBG 351 - 400: 20 units     insulin glargine 100 UNIT/ML injection  Commonly known as:  LANTUS  Inject 0.1 mLs (10 Units total) into the skin at bedtime.     Insulin Pen Needle 32G  X 5 MM Misc  Use as directed     levofloxacin 750 MG tablet  Commonly known as:  LEVAQUIN  Take 1 tablet (750 mg total) by mouth daily.     lisinopril 10 MG tablet  Commonly known as:  PRINIVIL  Take 1 tablet (10 mg total) by mouth daily.     metFORMIN 500 MG tablet  Commonly known as:  GLUCOPHAGE  Take 1 tablet (500 mg total) by mouth 2 (two) times daily with a meal.     Tiotropium Bromide Monohydrate 2.5 MCG/ACT Aers  Commonly known as:  SPIRIVA RESPIMAT  2 puffs each am     triamcinolone cream 0.1 %  Commonly known as:  KENALOG  Apply 1 application topically 2 (two) times daily.        Discharge Condition: Stable   Disposition: 01-Home or Self Care   Consults:  None  Significant Diagnostic Studies: Dg Chest 2 View  12/06/2014   CLINICAL DATA:  49 year old male with 3 day history of shortness of breath and cough  EXAM: CHEST  2 VIEW  COMPARISON:  Prior chest x-ray 11/27/2014   FINDINGS: Stable borderline cardiomegaly. Pulmonary vascular congestion without overt edema. Slight interval improvement in right middle lobe airspace opacity compared to 11/27/2014. The remaining densities are similar compared to prior studies and favored to reflect chronic atelectasis/ scarring. Similar changes in the left base. Slight interval increase in patchy airspace opacity in the lingula partially obscuring the left cardiac margin. Stable bronchitic change. Mild pulmonary hyperinflation. No acute osseous abnormality.  IMPRESSION: 1. Developing patchy airspace opacity in the lingula partially obscuring the cardiac margin which may reflect atelectasis or infiltrate. 2. Background chronic pulmonary parenchymal changes consistent with the clinical diagnosis of COPD. 3. Stable mild cardiomegaly.   Electronically Signed   By: Jacqulynn Cadet M.D.   On: 12/06/2014 20:56   Dg Chest 2 View  11/27/2014   CLINICAL DATA:  Chest pain. Cough. Shortness of breath. Smoking history.  EXAM: CHEST  2 VIEW  COMPARISON:  02/28/2014 and previous including CT 12/12/2013  FINDINGS: The heart is at the upper limits of normal in size. Mediastinal shadows are normal. There is chronic indistinctness of the right heart border related to pericardial/epicardial fat and adjacent pulmonary scarring. This appears slightly more pronounced than on previous studies in there could be some superimposed active inflammation in the right middle lobe. No other area of concern. No effusions. No bony findings.  IMPRESSION: Progressive loss of the right heart border compared to previous studies. Previously, this has been seen as secondary to scarring and epicardial fat. Because of the change in appearance, there could be an active component in that region presently.   Electronically Signed   By: Nelson Chimes M.D.   On: 11/27/2014 16:21      Microbiology: No results found for this or any previous visit (from the past 240 hour(s)).    Labs: Results for orders placed or performed during the hospital encounter of 12/06/14 (from the past 48 hour(s))  CBC with Differential     Status: Abnormal   Collection Time: 12/07/14 12:16 AM  Result Value Ref Range   WBC 6.7 4.0 - 10.5 K/uL   RBC 4.92 4.22 - 5.81 MIL/uL   Hemoglobin 14.4 13.0 - 17.0 g/dL   HCT 45.2 39.0 - 52.0 %   MCV 91.9 78.0 - 100.0 fL   MCH 29.3 26.0 - 34.0 pg   MCHC 31.9 30.0 - 36.0 g/dL   RDW  12.9 11.5 - 15.5 %   Platelets 217 150 - 400 K/uL   Neutrophils Relative % 48 43 - 77 %   Neutro Abs 3.2 1.7 - 7.7 K/uL   Lymphocytes Relative 37 12 - 46 %   Lymphs Abs 2.4 0.7 - 4.0 K/uL   Monocytes Relative 13 (H) 3 - 12 %   Monocytes Absolute 0.8 0.1 - 1.0 K/uL   Eosinophils Relative 2 0 - 5 %   Eosinophils Absolute 0.1 0.0 - 0.7 K/uL   Basophils Relative 0 0 - 1 %   Basophils Absolute 0.0 0.0 - 0.1 K/uL  Basic metabolic panel     Status: Abnormal   Collection Time: 12/07/14 12:16 AM  Result Value Ref Range   Sodium 136 135 - 145 mmol/L   Potassium 4.2 3.5 - 5.1 mmol/L   Chloride 103 96 - 112 mmol/L   CO2 25 19 - 32 mmol/L   Glucose, Bld 107 (H) 70 - 99 mg/dL   BUN 15 6 - 23 mg/dL   Creatinine, Ser 1.09 0.50 - 1.35 mg/dL   Calcium 8.5 8.4 - 10.5 mg/dL   GFR calc non Af Amer 79 (L) >90 mL/min   GFR calc Af Amer >90 >90 mL/min    Comment: (NOTE) The eGFR has been calculated using the CKD EPI equation. This calculation has not been validated in all clinical situations. eGFR's persistently <90 mL/min signify possible Chronic Kidney Disease.    Anion gap 8 5 - 15  Hemoglobin A1c     Status: Abnormal   Collection Time: 12/07/14  6:10 AM  Result Value Ref Range   Hgb A1c MFr Bld 6.0 (H) 4.8 - 5.6 %    Comment: (NOTE)         Pre-diabetes: 5.7 - 6.4         Diabetes: >6.4         Glycemic control for adults with diabetes: <7.0    Mean Plasma Glucose 126 mg/dL    Comment: (NOTE) Performed At: [facility] [place], [place]  [id number] Lindon Romp MD QA:[id number]   Glucose, capillary     Status: Abnormal   Collection Time: 12/07/14 11:25 AM  Result Value Ref Range   Glucose-Capillary 190 (H) 70 - 99 mg/dL  Strep pneumoniae urinary antigen     Status: None   Collection Time: 12/07/14 11:34 AM  Result Value Ref Range   Strep Pneumo Urinary Antigen NEGATIVE NEGATIVE    Comment:        Infection due to S. pneumoniae cannot be absolutely ruled out since the antigen present may be below the detection limit of the test.   Glucose, capillary     Status: Abnormal   Collection Time: 12/07/14  5:04 PM  Result Value Ref Range   Glucose-Capillary 283 (H) 70 - 99 mg/dL  Glucose, capillary     Status: Abnormal   Collection Time: 12/07/14 10:26 PM  Result Value Ref Range   Glucose-Capillary 218 (H) 70 - 99 mg/dL   Comment 1 Notify RN    Comment 2 Document in Chart   CBC     Status: Abnormal   Collection Time: 12/08/14  6:17 AM  Result Value Ref Range   WBC 15.5 (H) 4.0 - 10.5 K/uL   RBC 4.65 4.22 - 5.81 MIL/uL   Hemoglobin 13.4 13.0 - 17.0 g/dL   HCT 42.6 39.0 - 52.0 %   MCV 91.6 78.0 - 100.0 fL  MCH 28.8 26.0 - 34.0 pg   MCHC 31.5 30.0 - 36.0 g/dL   RDW 12.9 11.5 - 15.5 %   Platelets 233 150 - 400 K/uL  Comprehensive metabolic panel     Status: Abnormal   Collection Time: 12/08/14  6:17 AM  Result Value Ref Range   Sodium 136 135 - 145 mmol/L   Potassium 5.0 3.5 - 5.1 mmol/L   Chloride 104 96 - 112 mmol/L   CO2 26 19 - 32 mmol/L   Glucose, Bld 216 (H) 70 - 99 mg/dL   BUN 11 6 - 23 mg/dL   Creatinine, Ser 0.96 0.50 - 1.35 mg/dL   Calcium 8.7 8.4 - 10.5 mg/dL   Total Protein 8.3 6.0 - 8.3 g/dL   Albumin 3.3 (L) 3.5 - 5.2 g/dL   AST 19 0 - 37 U/L   ALT 32 0 - 53 U/L   Alkaline Phosphatase 36 (L) 39 - 117 U/L   Total Bilirubin 0.6 0.3 - 1.2 mg/dL   GFR calc non Af Amer >90 >90 mL/min   GFR calc Af Amer >90 >90 mL/min    Comment: (NOTE) The eGFR has been calculated using the CKD EPI  equation. This calculation has not been validated in all clinical situations. eGFR's persistently <90 mL/min signify possible Chronic Kidney Disease.    Anion gap 6 5 - 15  Glucose, capillary     Status: Abnormal   Collection Time: 12/08/14  6:31 AM  Result Value Ref Range   Glucose-Capillary 204 (H) 70 - 99 mg/dL   Comment 1 Notify RN    Comment 2 Document in Chart      HPI :49 y.o. male with a history of COPD Gold stage III, Chronic Respiratory Failure, DM2, HTN, HIV positive who presents to the ED with complaints of worsening SOB and productive cough x 2-3 days. He reports having fever and chills. He reports coughing up yellowish sputum. He has had +DOE and wheezing. He reports having to use his PRN NCO2 and Neb treatments more often. In the ED he was found to have a developing LLL infiltrate. He had been treated with Levaquin and Tamiflu for Acute Bronchitis on 04/18 after an ED visit. He was placed on IV Vancomycin and Zosyn and Referred for admission.    HOSPITAL COURSE:   Acute COPD exacerbation Patient started on IV Solu-Medrol, broad-spectrum antibiotics, vancomycin and Zosyn Patient remains   hypoxemic with baseline O2 requirements of 2-3 L with ambulation and continues to smoke Patient has been provided with a course of steroid taper and Levaquin for 7 more days Patient to follow-up with PCP and Dr. Graylon Good Continue Symbicort, Spiriva, nebulizer, oxygen  HIV Check CD4 count- Last CD4 =200 on 02/11/2-16, with Viral load < 20,Continue HAART Rx Follow-up with Dr. Graylon Good  Nicotine dependence Patient not interested in quitting at this time Declined nicotine patch  Diabetes mellitus insulin-dependent Continue Lantus and SSI, last hemoglobin A1c was 8.0 Resume metformin  Hypertension continue with lisinopril   Discharge Exam   Blood pressure 129/71, pulse 85, temperature 97.9 F (36.6 C), temperature source Oral, resp. rate 18, height _0  (1.753 m),  weight 122.925 kg (271 lb), SpO2 94 %.         Discharge Instructions    Diet - low sodium heart healthy    Complete by:  As directed      Increase activity slowly    Complete by:  As directed  Follow-up Information    Follow up with Angelica Chessman, MD. Schedule an appointment as soon as possible for a visit in 1 week.   Specialty:  Internal Medicine   Contact information:   [place] [address] [phone]       Signed: Reyne Dumas 12/08/2014, 9:45 AM

## 2014-12-08 NOTE — Progress Notes (Signed)
Results for Chris [name], Chris [name] (MRN [id number]) as of 12/08/2014 10:01  Ref. Range 12/14/2013 07:42 12/07/2014 11:25 12/07/2014 17:04 12/07/2014 22:26 12/08/2014 06:31  Glucose-Capillary Latest Ref Range: 70-99 mg/dL 956292 (H) 213190 (H) 086283 (H) 218 (H) 204 (H)  CBGs continue to be elevated. Recommend increasing Novolog correction scale To RESISTANT TID & HS since that is what he does at home. Will continue to follow while in hospital. Smith MinceKendra Montrez [place] RN BSN CDE

## 2014-12-12 IMAGING — CR DG CHEST 2V
2 series · 2 of 2 positions shown · non-contrast
Comparison: Chest CTA 12/12/2013 and earlier.

CLINICAL DATA: 47-year-old male with cough shortness of Breath
hypertension smoker. Initial encounter.

EXAM:
CHEST  2 VIEW

[view not recorded (1 of 2)]
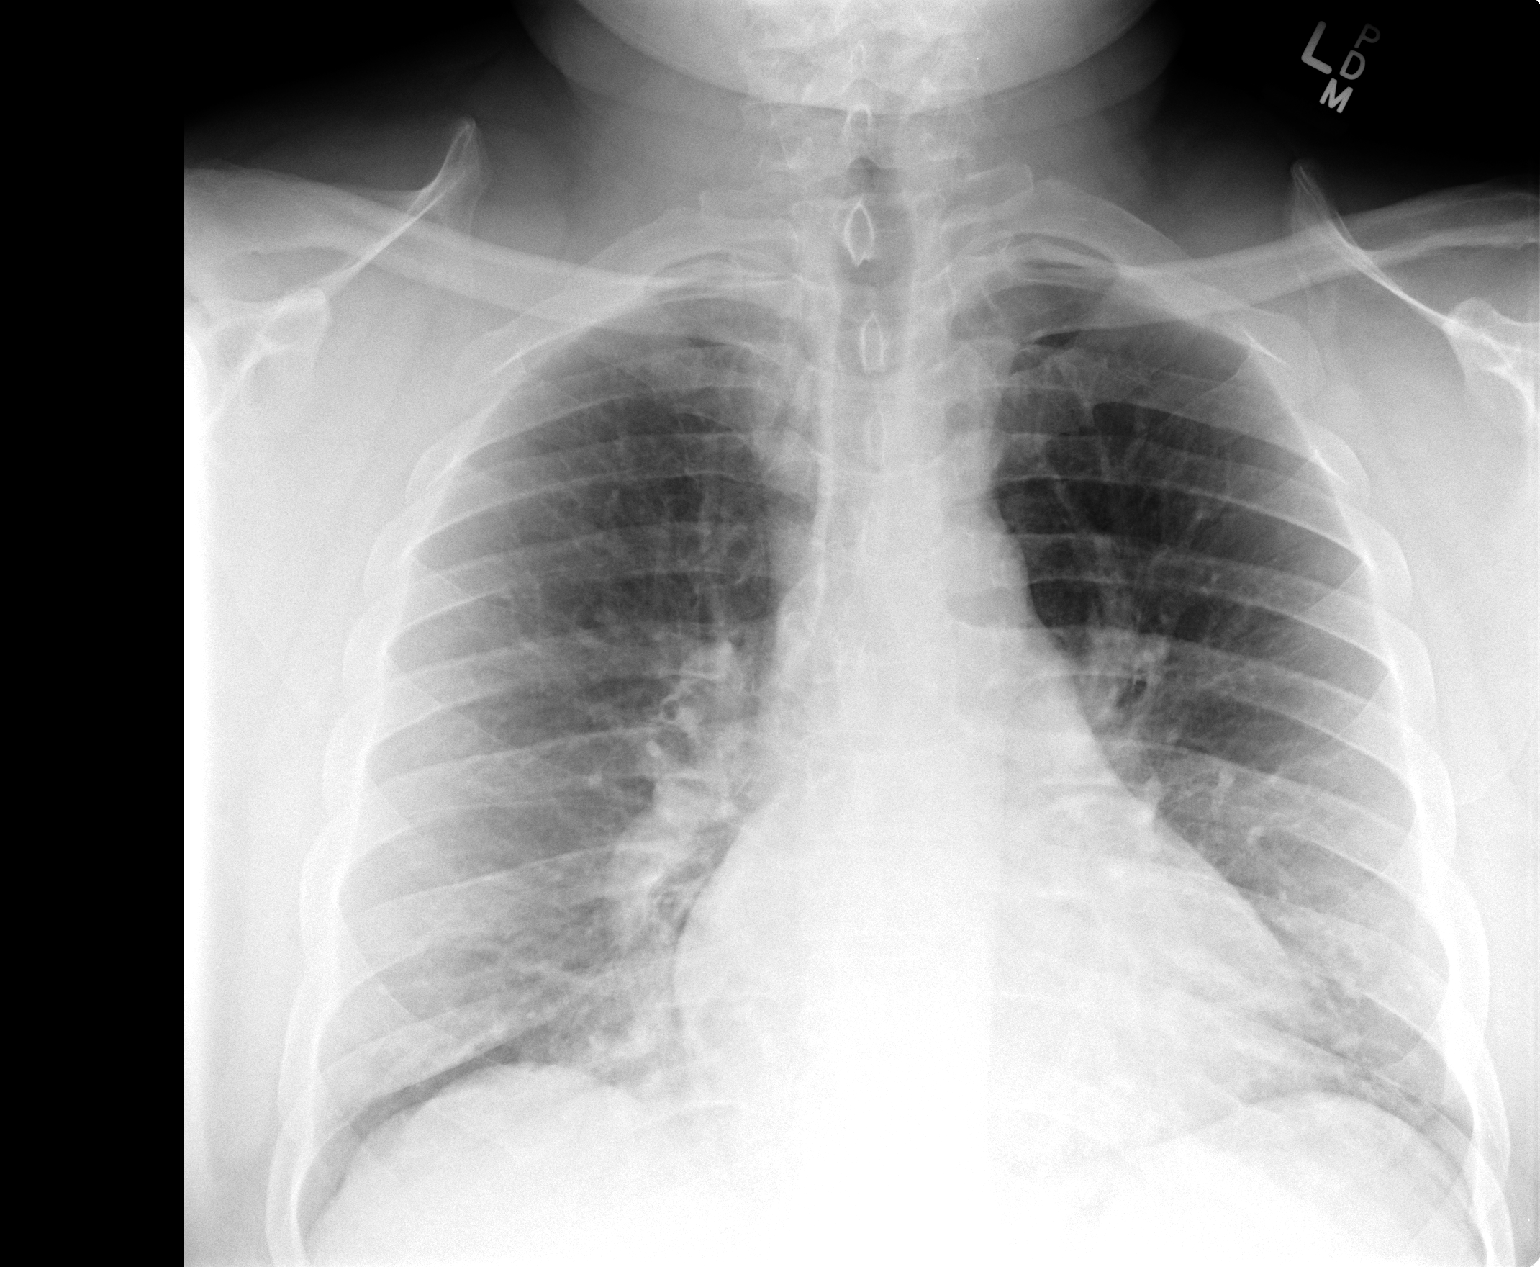

[view not recorded (2 of 2)]
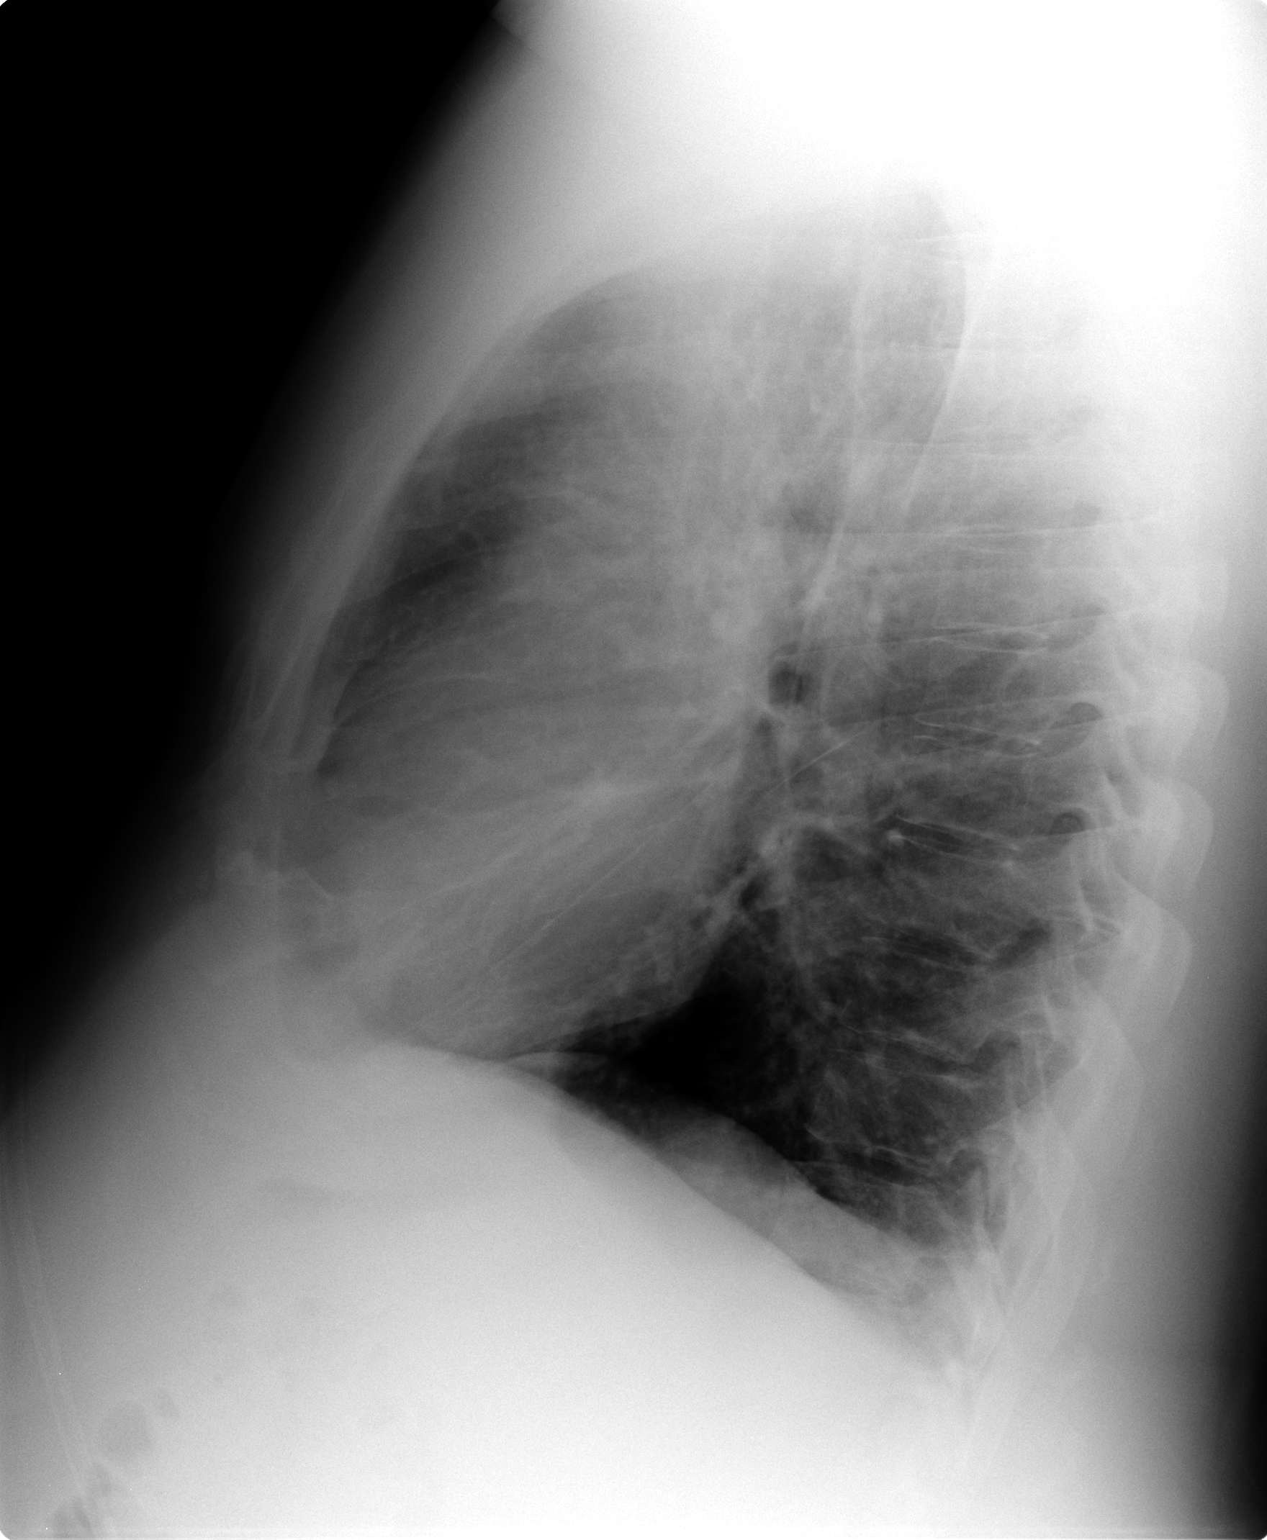

[2 of 2 positions shown; findings below may reference images not displayed]

FINDINGS: Mildly lower lung volumes. Stable cardiomegaly and mediastinal
contours. Visualized tracheal air column is within normal limits. No
pneumothorax or pulmonary edema. No pleural effusion or acute
pulmonary opacity. Mild chronic infrahilar atelectasis. No acute
osseous abnormality identified.
IMPRESSION: No acute cardiopulmonary abnormality.

## 2014-12-14 ENCOUNTER — Ambulatory Visit: Payer: Medicaid Other | Attending: Family Medicine | Admitting: Family Medicine

## 2014-12-14 ENCOUNTER — Encounter: Payer: Self-pay | Admitting: Family Medicine

## 2014-12-14 VITALS — BP 109/73 | HR 96 | Temp 98.2°F | Resp 18 | Ht 69.0 in | Wt 273.0 lb

## 2014-12-14 DIAGNOSIS — Z794 Long term (current) use of insulin: Secondary | ICD-10-CM | POA: Insufficient documentation

## 2014-12-14 DIAGNOSIS — J449 Chronic obstructive pulmonary disease, unspecified: Secondary | ICD-10-CM | POA: Diagnosis not present

## 2014-12-14 DIAGNOSIS — J189 Pneumonia, unspecified organism: Secondary | ICD-10-CM | POA: Insufficient documentation

## 2014-12-14 DIAGNOSIS — E118 Type 2 diabetes mellitus with unspecified complications: Secondary | ICD-10-CM | POA: Diagnosis not present

## 2014-12-14 DIAGNOSIS — F17210 Nicotine dependence, cigarettes, uncomplicated: Secondary | ICD-10-CM | POA: Insufficient documentation

## 2014-12-14 DIAGNOSIS — B20 Human immunodeficiency virus [HIV] disease: Secondary | ICD-10-CM | POA: Diagnosis not present

## 2014-12-14 DIAGNOSIS — E119 Type 2 diabetes mellitus without complications: Secondary | ICD-10-CM | POA: Diagnosis not present

## 2014-12-14 DIAGNOSIS — G4733 Obstructive sleep apnea (adult) (pediatric): Secondary | ICD-10-CM

## 2014-12-14 DIAGNOSIS — I10 Essential (primary) hypertension: Secondary | ICD-10-CM | POA: Insufficient documentation

## 2014-12-14 LAB — CBC WITH DIFFERENTIAL/PLATELET
BASOS ABS: 0.1 10*3/uL (ref 0.0–0.1)
Basophils Relative: 1 % (ref 0–1)
Eosinophils Absolute: 0.1 10*3/uL (ref 0.0–0.7)
Eosinophils Relative: 2 % (ref 0–5)
HEMATOCRIT: 44.3 % (ref 39.0–52.0)
Hemoglobin: 14 g/dL (ref 13.0–17.0)
LYMPHS PCT: 37 % (ref 12–46)
Lymphs Abs: 2.4 10*3/uL (ref 0.7–4.0)
MCH: 28.7 pg (ref 26.0–34.0)
MCHC: 31.6 g/dL (ref 30.0–36.0)
MCV: 90.8 fL (ref 78.0–100.0)
MONO ABS: 0.9 10*3/uL (ref 0.1–1.0)
MONOS PCT: 14 % — AB (ref 3–12)
MPV: 10.3 fL (ref 8.6–12.4)
Neutro Abs: 3 10*3/uL (ref 1.7–7.7)
Neutrophils Relative %: 46 % (ref 43–77)
Platelets: 289 10*3/uL (ref 150–400)
RBC: 4.88 MIL/uL (ref 4.22–5.81)
RDW: 13.2 % (ref 11.5–15.5)
WBC: 6.5 10*3/uL (ref 4.0–10.5)

## 2014-12-14 LAB — GLUCOSE, POCT (MANUAL RESULT ENTRY): POC Glucose: 156 mg/dl — AB (ref 70–99)

## 2014-12-14 NOTE — Patient Instructions (Signed)

## 2014-12-14 NOTE — Progress Notes (Signed)
Quick Note:  Labs addressed at office as well as medications and patient was made aware. ______ 
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
## 2014-12-14 NOTE — Progress Notes (Signed)
Patient hospitalized for pneumonia, discharged Friday. Patient reports feeling like symbicort and spiriva is not helping like it used to. Patient reports pain in lower back, at level 6, described as sore, took tylenol #3 this morning. Patient has started getting tired when taking shower.

## 2014-12-18 ENCOUNTER — Telehealth: Payer: Self-pay

## 2014-12-18 NOTE — Telephone Encounter (Signed)
Nurse called patient, patient verified date of birth. Patient aware of WBCs back to normal. Patient voices understanding.

## 2014-12-18 NOTE — Telephone Encounter (Signed)
-----   Message from Jaclyn ShaggyEnobong Amao, MD sent at 12/17/2014 11:13 PM EDT ----- WBC have trended down to normal.

## 2014-12-21 ENCOUNTER — Encounter: Payer: Self-pay | Admitting: Internal Medicine

## 2014-12-21 ENCOUNTER — Ambulatory Visit (INDEPENDENT_AMBULATORY_CARE_PROVIDER_SITE_OTHER): Payer: Medicaid Other | Admitting: Internal Medicine

## 2014-12-21 VITALS — BP 105/70 | HR 95 | Temp 98.2°F | Wt 273.0 lb

## 2014-12-21 DIAGNOSIS — J441 Chronic obstructive pulmonary disease with (acute) exacerbation: Secondary | ICD-10-CM | POA: Diagnosis not present

## 2014-12-21 DIAGNOSIS — E119 Type 2 diabetes mellitus without complications: Secondary | ICD-10-CM | POA: Diagnosis not present

## 2014-12-21 DIAGNOSIS — Z21 Asymptomatic human immunodeficiency virus [HIV] infection status: Secondary | ICD-10-CM | POA: Diagnosis present

## 2014-12-21 DIAGNOSIS — R6882 Decreased libido: Secondary | ICD-10-CM | POA: Diagnosis not present

## 2014-12-21 DIAGNOSIS — J449 Chronic obstructive pulmonary disease, unspecified: Secondary | ICD-10-CM

## 2014-12-21 DIAGNOSIS — B20 Human immunodeficiency virus [HIV] disease: Secondary | ICD-10-CM

## 2014-12-21 DIAGNOSIS — R894 Abnormal immunological findings in specimens from other organs, systems and tissues: Secondary | ICD-10-CM | POA: Diagnosis not present

## 2014-12-21 DIAGNOSIS — R768 Other specified abnormal immunological findings in serum: Secondary | ICD-10-CM

## 2014-12-21 DIAGNOSIS — Z794 Long term (current) use of insulin: Secondary | ICD-10-CM | POA: Diagnosis not present

## 2014-12-21 DIAGNOSIS — Z23 Encounter for immunization: Secondary | ICD-10-CM

## 2014-12-21 LAB — COMPLETE METABOLIC PANEL WITH GFR
ALBUMIN: 3.7 g/dL (ref 3.5–5.2)
ALT: 19 U/L (ref 0–53)
AST: 16 U/L (ref 0–37)
Alkaline Phosphatase: 39 U/L (ref 39–117)
BILIRUBIN TOTAL: 0.5 mg/dL (ref 0.2–1.2)
BUN: 11 mg/dL (ref 6–23)
CHLORIDE: 101 meq/L (ref 96–112)
CO2: 27 mEq/L (ref 19–32)
Calcium: 9 mg/dL (ref 8.4–10.5)
Creat: 0.82 mg/dL (ref 0.50–1.35)
GFR, Est African American: >89 mL/min
GFR, Est Non African American: >89 mL/min
GLUCOSE: 200 mg/dL — AB (ref 70–99)
Potassium: 4.3 mEq/L (ref 3.5–5.3)
SODIUM: 137 meq/L (ref 135–145)
TOTAL PROTEIN: 7.4 g/dL (ref 6.0–8.3)

## 2014-12-21 MED ORDER — BECLOMETHASONE DIPROPIONATE 80 MCG/ACT IN AERS: 2.0000 | INHALATION_SPRAY | Freq: Two times a day (BID) | RESPIRATORY_TRACT | Status: DC

## 2014-12-21 MED ORDER — BECLOMETHASONE DIPROPIONATE 80 MCG/ACT IN AERS: 2.0000 | INHALATION_SPRAY | Freq: Every day | RESPIRATORY_TRACT | Status: DC

## 2014-12-21 NOTE — Progress Notes (Signed)
Patient ID: Chris [name], male   DOB: Nov 03, 1965, 49 y.o.   MRN: [id number]       Patient ID: Chris [name], male   DOB: 1965/10/31, 49 y.o.   MRN: [id number]  HPI Chris [name] a 49yo M with HIV-HCV co infection, CD 4 count of 190/VL<20 on stribild-DRV regimen. The patient was recently discharged from the hospital, roughly 2wks ago for pneumonia. Since being at home, he is still not back to his baseline, he has DOE in going to [employer] or dumping out trash. He is using his inhalers as needed.  He has remote hx of IVDU, but stopped in 2004, no alcohol use since 2006.   His baseline activity includes walking 4 x around a track with his wife for exercise  Outpatient Encounter Prescriptions as of 12/21/2014  Medication Sig  . acetaminophen-codeine (TYLENOL #3) 300-30 MG per tablet Take 1 tablet by mouth every 4 (four) hours as needed.  [name] albuterol (PROVENTIL HFA;VENTOLIN HFA) 108 (90 BASE) MCG/ACT inhaler Inhale 2 puffs into the lungs every 4 (four) hours as needed for wheezing or shortness of breath (((PLAN B))).  . Blood Glucose Monitoring Suppl (ACCU-CHEK AVIVA PLUS) W/DEVICE KIT 250 Test up to 4 times daily  . budesonide-formoterol (SYMBICORT) 160-4.5 MCG/ACT inhaler Inhale 2 puffs into the lungs 2 (two) times daily.  . dapsone 100 MG tablet Take 1 tablet (100 mg total) by mouth daily.  . Darunavir Ethanolate (PREZISTA) 800 MG tablet Take 1 tablet (800 mg total) by mouth daily with breakfast.  . elvitegravir-cobicistat-emtricitabine-tenofovir (STRIBILD) 150-150-200-300 MG TABS tablet Take 1 tablet by mouth daily with breakfast.  . insulin glargine (LANTUS) 100 UNIT/ML injection Inject 0.1 mLs (10 Units total) into the skin at bedtime.  . Insulin Pen Needle 32G X 5 MM MISC Use as directed  . levofloxacin (LEVAQUIN) 750 MG tablet Take 1 tablet (750 mg total) by mouth daily.  [name] lisinopril (PRINIVIL) 10 MG tablet Take 1 tablet (10 mg total) by mouth daily.  . metFORMIN (GLUCOPHAGE) 500 MG tablet Take 1  tablet (500 mg total) by mouth 2 (two) times daily with a meal.  . triamcinolone cream (KENALOG) 0.1 % Apply 1 application topically 2 (two) times daily.  [name] albuterol (PROVENTIL) (5 MG/ML) 0.5% nebulizer solution Take 0.5 mLs (2.5 mg total) by nebulization every 4 (four) hours as needed for wheezing or shortness of breath (((PLAN C))). (Patient taking differently: Take 2.5 mg by nebulization daily. )  . benzonatate (TESSALON) 100 MG capsule Take 1 capsule (100 mg total) by mouth every 8 (eight) hours. (Patient not taking: Reported on 12/14/2014)  . fluconazole (DIFLUCAN) 200 MG tablet Take 1 tablet (200 mg total) by mouth once. Take 1 tablet (200 mg total) by mouth daily for 7 days (Patient not taking: Reported on 11/27/2014)  . glucose blood test strip Use as instructed  . HYDROcodone-homatropine (HYCODAN) 5-1.5 MG/5ML syrup Take 5 mLs by mouth every 6 (six) hours as needed for cough. (Patient not taking: Reported on 12/14/2014)  . insulin aspart (NOVOLOG) 100 UNIT/ML injection Inject 0-20 Units into the skin 3 (three) times daily with meals. CBG 70 - 120: 0 units CBG 121 - 150: 3 units CBG 151 - 200: 4 units CBG 201 - 250: 7 units CBG 251 - 300: 11 units CBG 301 - 350: 15 units CBG 351 - 400: 20 units (Patient not taking: Reported on 12/14/2014)  . predniSONE (DELTASONE) 5 MG tablet 8 tablets 3 days 7 tablets 3 days 6 tablets  3 days 5 tablets 3 days 4 tablets 3 days 3 tablets 3 days 2 tablets 3 days 1 tablet 3 days (Patient not taking: Reported on 12/14/2014)  . Tiotropium Bromide Monohydrate (SPIRIVA RESPIMAT) 2.5 MCG/ACT AERS 2 puffs each am (Patient taking differently: Take 2 puffs by mouth every morning. )   No facility-administered encounter medications on file as of 12/21/2014.     Patient Active Problem List   Diagnosis Date Noted  . Pneumonia 12/14/2014  . HCAP (healthcare-associated pneumonia) 12/07/2014  . Essential hypertension, benign 12/04/2014  . Atopic eczema 12/04/2014   . Dyspnea 12/12/2013  . Acute on chronic respiratory failure 12/12/2013  . COPD exacerbation 08/23/2013  . Cyst (solitary) of breast 08/23/2013  . Breast abscess 08/23/2013  . OSA (obstructive sleep apnea) 08/23/2013  . Smoker 02/17/2013  . Hyperglycemia 02/17/2013  . Uncontrolled diabetes mellitus 02/17/2013  . DM (diabetes mellitus) 01/27/2013  . Steroid-induced hyperglycemia 01/12/2013  . Chronic respiratory failure 01/12/2013  . COPD  GOLD III, still smoking 01/11/2013  . Nicotine abuse 01/11/2013  . HIV disease 12/06/2012     Health Maintenance Due  Topic Date Due  . FOOT EXAM  04/12/1976  . OPHTHALMOLOGY EXAM  04/12/1976  . URINE MICROALBUMIN  04/12/1976  . TETANUS/TDAP  04/12/1985     Review of Systems  Physical Exam   BP 105/70 mmHg  Pulse 95  Temp(Src) 98.2 F (36.8 C) (Oral)  Wt 273 lb (123.832 kg)  SpO2 92%  Lab Results  Component Value Date   CD4TCELL 9* 12/07/2014   Lab Results  Component Value Date   CD4TABS 190* 12/07/2014   CD4TABS 200* 09/21/2014   CD4TABS 180* 05/25/2014   Lab Results  Component Value Date   HIV1RNAQUANT <20 09/21/2014   Lab Results  Component Value Date   HEPBSAB REACTIVE* 12/06/2012   No results found for: RPR  CBC Lab Results  Component Value Date   WBC 6.5 12/14/2014   RBC 4.88 12/14/2014   HGB 14.0 12/14/2014   HCT 44.3 12/14/2014   PLT 289 12/14/2014   MCV 90.8 12/14/2014   MCH 28.7 12/14/2014   MCHC 31.6 12/14/2014   RDW 13.2 12/14/2014   LYMPHSABS 2.4 12/14/2014   MONOABS 0.9 12/14/2014   EOSABS 0.1 12/14/2014   BASOSABS 0.1 12/14/2014   BMET Lab Results  Component Value Date   NA 136 12/08/2014   K 5.0 12/08/2014   CL 104 12/08/2014   CO2 26 12/08/2014   GLUCOSE 216* 12/08/2014   BUN 11 12/08/2014   CREATININE 0.96 12/08/2014   CALCIUM 8.7 12/08/2014   GFRNONAA >90 12/08/2014   GFRAA >90 12/08/2014     Assessment and Plan  hiv = continue with current regimen. Well controlled. Cd 4  count <200  oi proph = continue with dapsone  hcv ab positive= will check hcv labs ,and elastography to see if he has chronic hepatitis c  COPD, gold III = recommend continue his current regimen. We will need to change him off of symbicort  And spiriva and switch onto Qvar to minimize drug interaction with protease inhibitor, darunavir. Will need to change his steroid inhalers to minimize risk of adrenal insuffiency due to drug interaction with darunavir

## 2014-12-22 ENCOUNTER — Ambulatory Visit: Payer: Medicaid Other | Admitting: Internal Medicine

## 2014-12-26 LAB — HEPATITIS C RNA QUANTITATIVE: HCV Quantitative: NOT DETECTED IU/mL (ref <15)

## 2014-12-28 LAB — HEPATITIS C GENOTYPE

## 2015-01-01 ENCOUNTER — Ambulatory Visit: Payer: Medicaid Other | Attending: Internal Medicine | Admitting: Internal Medicine

## 2015-01-01 ENCOUNTER — Encounter: Payer: Self-pay | Admitting: Internal Medicine

## 2015-01-01 VITALS — BP 138/81 | HR 92 | Wt 277.8 lb

## 2015-01-01 DIAGNOSIS — I10 Essential (primary) hypertension: Secondary | ICD-10-CM | POA: Diagnosis not present

## 2015-01-01 DIAGNOSIS — F1910 Other psychoactive substance abuse, uncomplicated: Secondary | ICD-10-CM

## 2015-01-01 DIAGNOSIS — B20 Human immunodeficiency virus [HIV] disease: Secondary | ICD-10-CM | POA: Diagnosis not present

## 2015-01-01 DIAGNOSIS — Z720 Tobacco use: Secondary | ICD-10-CM

## 2015-01-01 DIAGNOSIS — E118 Type 2 diabetes mellitus with unspecified complications: Secondary | ICD-10-CM

## 2015-01-01 NOTE — Patient Instructions (Signed)

## 2015-01-01 NOTE — Progress Notes (Signed)
Patient ID: Chris [name], male   DOB: 07-03-1966, 49 y.o.   MRN: [id number]   Chatham Howington, is a 49 y.o. male  BXU:[id number]  [id number]  DOB - 29-Sep-1965  Chief Complaint  Patient presents with  . Follow-up    Diabetes        Subjective:   Chris [name] a 49 y.o. male here today for a follow up visit. Patient with history of HIV disease recently found to have co-infection with HCV, diabetes mellitus, morbid obesity, hypertension, COPD on home oxygen, current every day smoker despite repeated counseling, here today for routine follow-up. He has no new complaint. He is compliant with medications, he needs no refills today. He is requesting Viagra for sexual performance enhancement. He is able to achieve erection but could not sustain it. Positive nocturnal tumescence. Patient agrees it might be due to his state of mind, he has been stressed out lately from his wife losing her job, that he has to pay all the bills from a disability check that is not significant. He is not able to work because of all these chronic medical conditions. He has not have diabetic eye exam check for over a year. He is working on quitting smoking, now reduced to about 2 cigarettes per day. He is doing well with breathing better since stopping Symbicort now on combination of Spiriva and Qvar for his COPD. Patient has No headache, No chest pain, No abdominal pain - No Nausea, No new weakness tingling or numbness, No Cough - SOB.  Problem  Type 2 Diabetes Mellitus With Complication    ALLERGIES: Allergies  Allergen Reactions  . Bactrim [Sulfamethoxazole-Trimethoprim] Hives    PAST MEDICAL HISTORY: Past Medical History  Diagnosis Date  . COPD (chronic obstructive pulmonary disease)   . HIV disease   . Emphysema   . Diabetes mellitus without complication     MEDICATIONS AT HOME: Prior to Admission medications   Medication Sig Start Date End Date Taking? Authorizing Provider  acetaminophen-codeine (TYLENOL  #3) 300-30 MG per tablet Take 1 tablet by mouth every 4 (four) hours as needed. 12/04/14  Yes Tresa Garter, MD  albuterol (PROVENTIL HFA;VENTOLIN HFA) 108 (90 BASE) MCG/ACT inhaler Inhale 2 puffs into the lungs every 4 (four) hours as needed for wheezing or shortness of breath (((PLAN B))). 12/04/14  Yes Aubreanna Percle E Doreene Burke, MD  albuterol (PROVENTIL) (5 MG/ML) 0.5% nebulizer solution Take 0.5 mLs (2.5 mg total) by nebulization every 4 (four) hours as needed for wheezing or shortness of breath (((PLAN C))). Patient taking differently: Take 2.5 mg by nebulization daily.  04/28/14  Yes Tresa Garter, MD  beclomethasone (QVAR) 80 MCG/ACT inhaler Inhale 2 puffs into the lungs 2 (two) times daily. 12/21/14  Yes Carlyle Basques, MD  Blood Glucose Monitoring Suppl (ACCU-CHEK AVIVA PLUS) W/DEVICE KIT 250 Test up to 4 times daily 04/28/14  Yes Eman Morimoto E Doreene Burke, MD  dapsone 100 MG tablet Take 1 tablet (100 mg total) by mouth daily. 02/08/14  Yes Carlyle Basques, MD  Darunavir Ethanolate (PREZISTA) 800 MG tablet Take 1 tablet (800 mg total) by mouth daily with breakfast. 06/20/14  Yes Carlyle Basques, MD  elvitegravir-cobicistat-emtricitabine-tenofovir (STRIBILD) 150-150-200-300 MG TABS tablet Take 1 tablet by mouth daily with breakfast. 06/20/14  Yes Carlyle Basques, MD  glucose blood test strip Use as instructed 04/20/14  Yes Digby Groeneveld E Doreene Burke, MD  insulin aspart (NOVOLOG) 100 UNIT/ML injection Inject 0-20 Units into the skin 3 (three) times daily with meals. CBG 70 -  120: 0 units CBG 121 - 150: 3 units CBG 151 - 200: 4 units CBG 201 - 250: 7 units CBG 251 - 300: 11 units CBG 301 - 350: 15 units CBG 351 - 400: 20 units 01/17/14  Yes Jayci Ellefson E Gracious Renken, MD  insulin glargine (LANTUS) 100 UNIT/ML injection Inject 0.1 mLs (10 Units total) into the skin at bedtime. 09/28/14  Yes Tresa Garter, MD  Insulin Pen Needle 32G X 5 MM MISC Use as directed 06/12/14  Yes Sakura Denis E Doreene Burke, MD  levofloxacin  (LEVAQUIN) 750 MG tablet Take 1 tablet (750 mg total) by mouth daily. 12/08/14  Yes Reyne Dumas, MD  lisinopril (PRINIVIL) 10 MG tablet Take 1 tablet (10 mg total) by mouth daily. 12/04/14  Yes Tresa Garter, MD  metFORMIN (GLUCOPHAGE) 500 MG tablet Take 1 tablet (500 mg total) by mouth 2 (two) times daily with a meal. 12/04/14  Yes Tresa Garter, MD  Tiotropium Bromide Monohydrate (SPIRIVA RESPIMAT) 2.5 MCG/ACT AERS 2 puffs each am Patient taking differently: Take 2 puffs by mouth every morning.  12/04/14  Yes Tresa Garter, MD  triamcinolone cream (KENALOG) 0.1 % Apply 1 application topically 2 (two) times daily. 12/04/14  Yes Tresa Garter, MD     Objective:   Filed Vitals:   01/01/15 1139  BP: 138/81  Pulse: 92  Weight: 277 lb 12.8 oz (126.009 kg)  SpO2: 96%    Exam General appearance : Awake, alert, not in any distress. Speech Clear. Not toxic looking, morbidly obese, oxygen by nasal cannula in situ HEENT: Atraumatic and Normocephalic, pupils equally reactive to light and accomodation Neck: supple, no JVD. No cervical lymphadenopathy.  Chest:Good air entry bilaterally, no added sounds  CVS: S1 S2 regular, no murmurs.  Abdomen: Bowel sounds present, Non tender and not distended with no gaurding, rigidity or rebound. Extremities: B/L Lower Ext shows no edema, both legs are warm to touch Neurology: Awake alert, and oriented X 3, CN II-XII intact, Non focal Skin:No Rash  Data Review Lab Results  Component Value Date   HGBA1C 6.0* 12/07/2014   HGBA1C 8.0 09/28/2014   HGBA1C 6.5 04/20/2014     Assessment & Plan   1. Essential hypertension, benign  - We have discussed target BP range and blood pressure goal - I have advised patient to check BP regularly and to call us back or report to clinic if the numbers are consistently higher than 140/90  - We discussed the importance of compliance with medical therapy and DASH diet recommended, consequences of  uncontrolled hypertension discussed.  - continue current BP medications  2. Type 2 diabetes mellitus with complication  - Ambulatory referral to Ophthalmology  Aim for 2-3 Carb Choices per meal (30-45 grams) +/- 1 either way  Aim for 0-15 Carbs per snack if hungry  Include protein in moderation with your meals and snacks  Consider reading food labels for Total Carbohydrate and Fat Grams of foods  Consider checking BG at alternate times per day  Continue taking medication as directed Fruit Punch - find one with no sugar  Measure and decrease portions of carbohydrate foods  Make your plate and don't go back for seconds   3. HIV disease Continue current medications Follow-up with infectious disease specialist.  4. Nicotine abuse  The patient was counseled on the dangers of tobacco use, and was advised to quit. Reviewed strategies to maximize success, including removing cigarettes and smoking materials from environment, stress management and support of  family/friends.  Patient have been counseled extensively about nutrition and exercise Return in about 2 months (around 03/03/2015), or if symptoms worsen or fail to improve, for Hemoglobin A1C and Follow up, DM, Follow up HTN, Follow up Pain and comorbidities.  The patient was given clear instructions to go to ER or return to medical center if symptoms don't improve, worsen or new problems develop. The patient verbalized understanding. The patient was told to call to get lab results if they haven't heard anything in the next week.   This note has been created with Surveyor, quantity. Any transcriptional errors are unintentional.    Angelica Chessman, MD, MHA, [place], [place], [place] [facility] [place] [facility] [place], [place]   01/01/2015, 12:07 PM

## 2015-01-01 NOTE — Progress Notes (Signed)
Pt is here for a Diabetes follow up. He c/o having lots of mucus in his throat. He would like something to help with the mucus.

## 2015-01-05 ENCOUNTER — Ambulatory Visit (INDEPENDENT_AMBULATORY_CARE_PROVIDER_SITE_OTHER): Payer: Medicaid Other | Admitting: Internal Medicine

## 2015-01-05 ENCOUNTER — Encounter: Payer: Self-pay | Admitting: Internal Medicine

## 2015-01-05 ENCOUNTER — Ambulatory Visit (INDEPENDENT_AMBULATORY_CARE_PROVIDER_SITE_OTHER)
Admission: RE | Admit: 2015-01-05 | Discharge: 2015-01-05 | Disposition: A | Discharge status: Home / Self Care | Payer: Medicaid Other | Source: Ambulatory Visit | Attending: Internal Medicine | Admitting: Internal Medicine

## 2015-01-05 VITALS — BP 104/60 | HR 101 | Ht 69.0 in | Wt 277.6 lb

## 2015-01-05 DIAGNOSIS — J9612 Chronic respiratory failure with hypercapnia: Secondary | ICD-10-CM

## 2015-01-05 DIAGNOSIS — J449 Chronic obstructive pulmonary disease, unspecified: Secondary | ICD-10-CM

## 2015-01-05 DIAGNOSIS — F17200 Nicotine dependence, unspecified, uncomplicated: Secondary | ICD-10-CM

## 2015-01-05 DIAGNOSIS — Z720 Tobacco use: Secondary | ICD-10-CM

## 2015-01-05 NOTE — Progress Notes (Signed)
Subjective:    Patient ID: Chris [name], male    DOB: 10/01/1965   MRN: [id number]    Brief patient profile:  48 yobm smoker with HIV  and doe x 2009 much worse since March 2014 so referred by Manson PasseyAlma Devine to pulmonary clinic 02/15/2013 with GOLD III copd by pfts 04/28/13.  HPI 02/15/2013 1st pulmonary eval  Cc indolent onset progressive doe x walking slow pace more than 100 ft,  not at rest., 02 dep, doe much worse x 3 months assoc with congested cough esp in am with sev tbsp thick white mucus takes about 30 min to clear.  No better on inhalers tried to date including advair and spiriva - albuterol works the best and using lots of saba and nebs rec Stop advair and spiriva Plan A = Automatic = Start symbicort 160 Take 2 puffs first thing in am and then another 2 puffs about 12 hours later.  Plan B = backup= Only use your albuterol (as a rescue medication to be used if you can't catch your breath by resting or doing a relaxed purse lip breathing pattern. The less you use it, the better it will work when you need it. Ok to use up to 2 puffs every 4 hours Plan C = nebulizer, use this only if plan B doesn't work ok to use up to 4 hours  03/15/2013 f/u ov/Chris [name] still smoking  Chief Complaint  Patient presents with  . 4 wk follow up    Breathing worse at times.  Wheezing, chest tightness at times, and cough with yellow to clear mucus at times also reported.  Fatigues easily.   has not followed the previous instructions and symptoms have not really changed  rec Plan A = Automatic = Start symbicort 160 Take 2 puffs first thing in am and then another 2 puffs about 12 hours later.  Plan B = backup= Only use your albuterol (proiare) as a rescue medication to be used if you can't catch your breath by resting or doing a relaxed purse lip breathing pattern. The less you use it, the better it will work when you need it. Ok to use up to 2 puffs every 4 hours Plan C = nebulizer, use this only if plan B doesn't work  after 15 min ok to use up to 4 hours  The key is to stop smoking completely before smoking completely stops you!    04/28/2013 f/u ov/Chris [name] still smoking  re:  GOLD III/ 02 dep @ 2lpm  Chief Complaint  Patient presents with  . Follow-up    Pt states that his breathing has improved, symbicort seems to be helping some. No new co's today. Using proair approx 4 times per wk.   still doe but has learned to pace himself and overall better but somewhat limited from desired activities even on his best days rec Plan A = Automatic =  symbicort 160 Take 2 puffs first thing in am and then another 2 puffs about 12 hours later.  Add spiriva one capsule daily each am Plan B = backup= Only use your albuterol (proiare) as a rescue medication to be used if you can't catch your breath by resting or doing a relaxed purse lip breathing pattern. The less you use it, the better it will work when you need it. Ok to use up to 2 puffs every 4 hours Plan C = nebulizer, use this only if plan B doesn't work after 15 min ok to use up  to 4 hours   03/14/2014 Follow up and Medication review  COPD GOLD III/ smoking only 2x daily / 02 2lpm 24/7/ symbicort  Patient returns for a two-week followup and medication review. We reviewed all his medications. Organized them into a medication calendar with patient education. Patient left a few of his medications at home.  Feels th at his breathing has improved since on Spiriva.  Patient has cut back on smoking. We discussed smoking cessation. Pt has had frequent COPD flare with steroids used x 2 over last 2 months. We discussed sided effects and risk/benefit of frequent steroid use especially along with his HIV meds and ICS inhalers.He verbalized understanding.  rec Use medical med calendar   05/16/2014 f/u ov/Chris [name] re: copd III/ still smoking/ no med calendar /02 dep but not using  Chief Complaint  Patient presents with  . Follow-up    COPD; fatigue; usually has O2 on 2L, did not  bring with him today; sometimes wears  2L at night   does not need saba often, sleeps ok despite taking second dose of symbicort 8 h p first  Also supposed to be on maint spiriva but not clear he's using it  rec The key is to stop smoking completely before smoking completely stops you!  See calendar for specific medication    01/05/2015 f/u ov/Chris [name] re: GOLD III copd on Qvar 80 2bid /spiriva  Chief Complaint  Patient presents with  . Follow-up    Pt states that his breathing is doing well overall. He is using albuterol inhaler 3 x per wk on average and uses neb usually once per day.   using proair one or twice and not nebulizer at all   No obvious day to day or daytime variabilty or assoc chronic cough or cp or chest tightness, subjective wheeze overt sinus or hb symptoms. No unusual exp hx or h/o childhood pna/ asthma or knowledge of premature birth.  Sleeping ok without nocturnal  or early am exacerbation  of respiratory  c/o's or need for noct saba. Also denies any obvious fluctuation of symptoms with weather or environmental changes or other aggravating or alleviating factors except as outlined above   Current Medications, Allergies, Complete Past Medical History, Past Surgical History, Family History, and Social History were reviewed in [facility] electronic medical record.  ROS  The following are not active complaints unless bolded sore throat, dysphagia, dental problems, itching, sneezing,  nasal congestion or excess/ purulent secretions, ear ache,   fever, chills, sweats, unintended wt loss, pleuritic or exertional cp, hemoptysis,  orthopnea pnd or leg swelling, presyncope, palpitations, heartburn, abdominal pain, anorexia, nausea, vomiting, diarrhea  or change in bowel or urinary habits, change in stools or urine, dysuria,hematuria,  rash, arthralgias, visual complaints, headache, numbness weakness or ataxia or problems with walking or coordination,  change in mood/affect or  memory.                     Objective:   Physical Exam   amb bm nad   01/05/2015       278 Wt Readings from Last 3 Encounters:  05/16/14 285 lb 6.4 oz (129.457 kg)  04/20/14 282 lb 12.8 oz (128.277 kg)  03/14/14 274 lb 3.2 oz (124.376 kg)     HEENT mild turbinate edema.  Oropharynx no thrush or excess pnd or cobblestoning.  No JVD or cervical adenopathy. Mild accessory muscle hypertrophy. Trachea midline, nl thryroid. Chest was hyperinflated by percussion with diminished breath  sounds and moderate increased exp time without wheeze. Hoover sign positive at mid inspiration. Regular rate and rhythm without murmur gallop or rub or increase P2 or edema.  Abd: no hsm, nl excursion. Ext warm without cyanosis or clubbing.      I personally reviewed images and agree with radiology impression as follows:  CXR:  01/05/15 Resolution of the previously seen lingular infiltrate with new area of subsegmental atelectasis.        Assessment & Plan:

## 2015-01-05 NOTE — Patient Instructions (Addendum)
02 can just be used as needed  The key is to stop smoking completely before smoking completely stops you!   Please remember to go to the   x-ray department downstairs for your tests - we will call you with the results when they are available.     Pulmonary follow up is as needed

## 2015-01-05 NOTE — Progress Notes (Signed)
Quick Note:  LMTCB ______ 

## 2015-01-07 NOTE — Assessment & Plan Note (Addendum)
-   04/28/2013 PFT's FEV1 1.21 (39%) ratio 59 and 13% better p B2 and dlco 72 corrects to 102  - started spiriva 04/30/2013 > changed to respimat 02/28/2014 -02/28/2014 p extensive coaching HFA effectiveness =    90%  -Med calendar 03/14/2014 > not using as of 05/16/14  -  03/20/2014   Informed insurance not covering respimat    I had an extended final summary discussion with the patient reviewing all relevant studies completed to date and  lasting 15 to 20 minutes of a 25 minute visit on the following issues:    1) despite severity of his copd he is relatively well compensated though not very active  2) smoking cessation reviewed separately  3) risk of ICS in HIV reviewed > doubt he would even need them if stopped smokiing as the AB component would probably resolve  4) Each maintenance medication was reviewed in detail including most importantly the difference between maintenance and as needed and under what circumstances the prns are to be used.  Please see instructions for details which were reviewed in writing and the patient given a copy.    5) pulmonary f/u can be prn at this point

## 2015-01-07 NOTE — Assessment & Plan Note (Addendum)
02 2lpm 24/7 "prn" -  HCO3 31 01/23/14  - 01/05/2015  Walked RA x 3 laps @ 185 ft each stopped due to  Leg pain and fatibue, no sob , nl pace, no desat   Advised Ok to just use 02 at hs and prn daytime though no evidence he's limited by 02 with exertion based on today's study.

## 2015-01-07 NOTE — Assessment & Plan Note (Signed)
>   3 min discussion I reviewed the Fletcher curve with the patient that basically indicates  if you quit smoking when your best day FEV1 is still preserved at a level where you can still function  it is highly unlikely you will progress to end stage disease and informed the patient there was no medication on the market that has proven to alter the curve/ its downward trajectory  or the likelihood of progression of their disease.  Therefore stopping smoking and maintaining abstinence is the most important aspect of care, not choice of inhalers or for that matter, doctors.

## 2015-01-09 ENCOUNTER — Telehealth: Payer: Self-pay | Admitting: Internal Medicine

## 2015-01-09 NOTE — Telephone Encounter (Signed)
Called and spoke to pt. Informed him of the results and recs per MW. Pt verbalized understanding and denied any further questions or concerns at this time.    Notes Recorded by Nyoka CowdenMichael B [name], MD on 01/05/2015 at 5:15 PM Call pt: Reviewed cxr and previous area has cleared with minimal scarring / no further studies needed

## 2015-01-09 NOTE — Progress Notes (Signed)
Quick Note:  Called and spoke to pt. Informed him of the results and recs per MW. Pt verbalized understanding and denied any further questions or concerns at this time.   ______ 

## 2015-01-22 ENCOUNTER — Ambulatory Visit: Payer: Medicaid Other | Admitting: Podiatry

## 2015-01-22 ENCOUNTER — Ambulatory Visit: Payer: Medicaid Other | Attending: Internal Medicine | Admitting: Internal Medicine

## 2015-01-22 ENCOUNTER — Encounter: Payer: Self-pay | Admitting: Internal Medicine

## 2015-01-22 VITALS — BP 100/67 | HR 93 | Temp 98.5°F | Ht 69.0 in | Wt 272.0 lb

## 2015-01-22 DIAGNOSIS — N521 Erectile dysfunction due to diseases classified elsewhere: Secondary | ICD-10-CM | POA: Insufficient documentation

## 2015-01-22 DIAGNOSIS — A630 Anogenital (venereal) warts: Secondary | ICD-10-CM | POA: Diagnosis not present

## 2015-01-22 DIAGNOSIS — L209 Atopic dermatitis, unspecified: Secondary | ICD-10-CM

## 2015-01-22 DIAGNOSIS — B20 Human immunodeficiency virus [HIV] disease: Secondary | ICD-10-CM | POA: Diagnosis not present

## 2015-01-22 DIAGNOSIS — Z720 Tobacco use: Secondary | ICD-10-CM | POA: Insufficient documentation

## 2015-01-22 DIAGNOSIS — R894 Abnormal immunological findings in specimens from other organs, systems and tissues: Secondary | ICD-10-CM | POA: Diagnosis not present

## 2015-01-22 DIAGNOSIS — J449 Chronic obstructive pulmonary disease, unspecified: Secondary | ICD-10-CM | POA: Diagnosis not present

## 2015-01-22 DIAGNOSIS — E118 Type 2 diabetes mellitus with unspecified complications: Secondary | ICD-10-CM

## 2015-01-22 DIAGNOSIS — N528 Other male erectile dysfunction: Secondary | ICD-10-CM

## 2015-01-22 LAB — GLUCOSE, POCT (MANUAL RESULT ENTRY): POC Glucose: 82 mg/dl (ref 70–99)

## 2015-01-22 MED ORDER — TRIAMCINOLONE ACETONIDE 0.1 % EX CREA: 1.0000 "application " | TOPICAL_CREAM | Freq: Two times a day (BID) | CUTANEOUS | Status: DC

## 2015-01-22 MED ORDER — TIOTROPIUM BROMIDE MONOHYDRATE 2.5 MCG/ACT IN AERS: 2.0000 | INHALATION_SPRAY | Freq: Every morning | RESPIRATORY_TRACT | Status: DC

## 2015-01-22 MED ORDER — ACETAMINOPHEN-CODEINE #3 300-30 MG PO TABS: 1.0000 | ORAL_TABLET | ORAL | Status: DC | PRN

## 2015-01-22 MED ORDER — BECLOMETHASONE DIPROPIONATE 80 MCG/ACT IN AERS: 2.0000 | INHALATION_SPRAY | Freq: Two times a day (BID) | RESPIRATORY_TRACT | Status: DC

## 2015-01-22 NOTE — Progress Notes (Signed)
Patient ID: Chris [name], male   DOB: [birth year], 49 y.o.   MRN: [id number]   [name], is a 49 y.o. male  OZD:[id number]  QVZ:[id number]  DOB - 1966/01/14  Chief Complaint  Patient presents with  . penile lesion  . Medication Refill        Subjective:   Chris [name] a 49 y.o. male here today for a follow up visit. He has HIV disease recently found to have infection with HCV, diabetes mellitus type 2, morbid obesity, hypertension, COPD and currently everyday smoker. Patient complains of "2 white spots" on the glans of his penis.He states he noticed them a week ago. He says his erectile dysfunction is worse that he can get an erection but it does not last. He needs refills on his inhalers and Tylenol 3 Patient has No headache, No chest pain, No abdominal pain - No Nausea, No new weakness tingling or numbness, No Cough - SOB.  Problem  Genital Warts  Other Male Erectile Dysfunction    ALLERGIES: Allergies  Allergen Reactions  . Bactrim [Sulfamethoxazole-Trimethoprim] Hives    PAST MEDICAL HISTORY: Past Medical History  Diagnosis Date  . COPD (chronic obstructive pulmonary disease)   . HIV disease   . Emphysema   . Diabetes mellitus without complication     MEDICATIONS AT HOME: Prior to Admission medications   Medication Sig Start Date End Date Taking? Authorizing Provider  acetaminophen-codeine (TYLENOL #3) 300-30 MG per tablet Take 1 tablet by mouth every 4 (four) hours as needed. 01/22/15  Yes Tresa Garter, MD  albuterol (PROVENTIL HFA;VENTOLIN HFA) 108 (90 BASE) MCG/ACT inhaler Inhale 2 puffs into the lungs every 4 (four) hours as needed for wheezing or shortness of breath (((PLAN B))). 12/04/14  Yes Kore Madlock E Doreene Burke, MD  albuterol (PROVENTIL) (5 MG/ML) 0.5% nebulizer solution Take 0.5 mLs (2.5 mg total) by nebulization every 4 (four) hours as needed for wheezing or shortness of breath (((PLAN C))). Patient taking differently: Take 2.5 mg by nebulization daily.   04/28/14  Yes Tresa Garter, MD  beclomethasone (QVAR) 80 MCG/ACT inhaler Inhale 2 puffs into the lungs 2 (two) times daily. 01/22/15  Yes Tresa Garter, MD  Blood Glucose Monitoring Suppl (ACCU-CHEK AVIVA PLUS) W/DEVICE KIT 250 Test up to 4 times daily 04/28/14  Yes Coral Timme E Doreene Burke, MD  dapsone 100 MG tablet Take 1 tablet (100 mg total) by mouth daily. 02/08/14  Yes Carlyle Basques, MD  Darunavir Ethanolate (PREZISTA) 800 MG tablet Take 1 tablet (800 mg total) by mouth daily with breakfast. 06/20/14  Yes Carlyle Basques, MD  elvitegravir-cobicistat-emtricitabine-tenofovir (STRIBILD) 150-150-200-300 MG TABS tablet Take 1 tablet by mouth daily with breakfast. 06/20/14  Yes Carlyle Basques, MD  glucose blood test strip Use as instructed 04/20/14  Yes Evonna Stoltz E Doreene Burke, MD  insulin glargine (LANTUS) 100 UNIT/ML injection Inject 0.1 mLs (10 Units total) into the skin at bedtime. 09/28/14  Yes Tresa Garter, MD  Insulin Pen Needle 32G X 5 MM MISC Use as directed 06/12/14  Yes Cordella Nyquist E Doreene Burke, MD  lisinopril (PRINIVIL) 10 MG tablet Take 1 tablet (10 mg total) by mouth daily. 12/04/14  Yes Tresa Garter, MD  metFORMIN (GLUCOPHAGE) 500 MG tablet Take 1 tablet (500 mg total) by mouth 2 (two) times daily with a meal. 12/04/14  Yes Tresa Garter, MD  Tiotropium Bromide Monohydrate (SPIRIVA RESPIMAT) 2.5 MCG/ACT AERS Take 2 puffs by mouth every morning. 01/22/15  Yes Tresa Garter, MD  triamcinolone  cream (KENALOG) 0.1 % Apply 1 application topically 2 (two) times daily. 01/22/15  Yes Tresa Garter, MD     Objective:   Filed Vitals:   01/22/15 1710  BP: 100/67  Pulse: 93  Temp: 98.5 F (36.9 C)  Height: '5\' 9"'  (1.753 m)  Weight: 272 lb (123.378 kg)  SpO2: 94%    Exam General appearance : Awake, alert, not in any distress. Speech Clear. Not toxic looking, morbidly obese HEENT: Atraumatic and Normocephalic, pupils equally reactive to light and accomodation Neck:  supple, no JVD. No cervical lymphadenopathy.  Chest:Good air entry bilaterally, no added sounds  CVS: S1 S2 regular, no murmurs.  Abdomen: Bowel sounds present, Non tender and not distended with no gaurding, rigidity or rebound. Extremities: B/L Lower Ext shows no edema, both legs are warm to touch Neurology: Awake alert, and oriented X 3, CN II-XII intact, Non focal Skin:No Rash  Data Review Lab Results  Component Value Date   HGBA1C 6.0* 12/07/2014   HGBA1C 8.0 09/28/2014   HGBA1C 6.5 04/20/2014     Assessment & Plan   1. Type 2 diabetes mellitus with complication  - Microalbumin, urine; Standing - Microalbumin, urine - Glucose (CBG)  Aim for 2-3 Carb Choices per meal (30-45 grams) +/- 1 either way  Aim for 0-15 Carbs per snack if hungry  Include protein in moderation with your meals and snacks  Consider reading food labels for Total Carbohydrate and Fat Grams of foods  Consider checking BG at alternate times per day  Continue taking medication as directed Fruit Punch - find one with no sugar  Measure and decrease portions of carbohydrate foods  Make your plate and don't go back for seconds   2. COPD mixed type: Improved, patient now on oxygen at night only. SPO2 today is 94% on room air.  - Tiotropium Bromide Monohydrate (SPIRIVA RESPIMAT) 2.5 MCG/ACT AERS; Take 2 puffs by mouth every morning.  Dispense: 1 Inhaler; Refill: 3 - beclomethasone (QVAR) 80 MCG/ACT inhaler; Inhale 2 puffs into the lungs 2 (two) times daily.  Dispense: 1 Inhaler; Refill: 12 - acetaminophen-codeine (TYLENOL #3) 300-30 MG per tablet; Take 1 tablet by mouth every 4 (four) hours as needed.  Dispense: 90 tablet; Refill: 0  3. Genital warts Discussed with infectious disease specialist because of background HIV  4. HIV disease Follow up with infectious disease specialist  5. Other male erectile dysfunction  There is major interaction between PDE-5 inhibitors and HIV meds, patient was counseled  extensively and will defer the decision to use these medications to infectious disease specialist.  6. Atopic eczema  - triamcinolone cream (KENALOG) 0.1 %; Apply 1 application topically 2 (two) times daily.  Dispense: 30 g; Refill: 0  Patient have been counseled extensively about nutrition and exercise Return if symptoms worsen or fail to improve, for Routine Follow Up.  The patient was given clear instructions to go to ER or return to medical center if symptoms don't improve, worsen or new problems develop. The patient verbalized understanding. The patient was told to call to get lab results if they haven't heard anything in the next week.   This note has been created with Surveyor, quantity. Any transcriptional errors are unintentional.    Angelica Chessman, MD, [place], [place], [place], [place] and [place], [place]   01/22/2015, 5:31 PM

## 2015-01-22 NOTE — Progress Notes (Signed)
Patient complains of "2 white spots" on the glans of his penis.  He states he noticed them a week ago He says his erectile dysfunction is worse that he can get an erection but it does not last He needs refills on his inhalers and Tylenol 3

## 2015-01-23 ENCOUNTER — Ambulatory Visit: Payer: Medicaid Other | Admitting: Podiatry

## 2015-01-23 LAB — MICROALBUMIN, URINE: Microalb, Ur: 7.2 mg/dL — ABNORMAL HIGH (ref <2.0)

## 2015-01-30 ENCOUNTER — Telehealth: Payer: Self-pay | Admitting: Licensed Clinical Social Worker

## 2015-01-30 NOTE — Telephone Encounter (Signed)
Patient states that the tip of his penis is white and itches and now he has "white stuff" coming out of the corners of his mouth. Patient is very anxious sounding and wants to be seen. Please advise

## 2015-02-01 NOTE — Telephone Encounter (Signed)
Per Dr Drue Second called the patient and made him an appt for 02/05/15 at 145 pm.

## 2015-02-01 NOTE — Telephone Encounter (Signed)
Can you have him come in Monday

## 2015-02-05 ENCOUNTER — Ambulatory Visit (INDEPENDENT_AMBULATORY_CARE_PROVIDER_SITE_OTHER): Payer: Medicaid Other | Admitting: Internal Medicine

## 2015-02-05 ENCOUNTER — Encounter: Payer: Self-pay | Admitting: Internal Medicine

## 2015-02-05 VITALS — BP 111/70 | HR 90 | Temp 98.2°F | Ht 69.0 in | Wt 275.0 lb

## 2015-02-05 DIAGNOSIS — B379 Candidiasis, unspecified: Secondary | ICD-10-CM | POA: Diagnosis not present

## 2015-02-05 DIAGNOSIS — Z794 Long term (current) use of insulin: Secondary | ICD-10-CM | POA: Diagnosis not present

## 2015-02-05 DIAGNOSIS — R6882 Decreased libido: Secondary | ICD-10-CM | POA: Diagnosis not present

## 2015-02-05 DIAGNOSIS — N528 Other male erectile dysfunction: Secondary | ICD-10-CM

## 2015-02-05 DIAGNOSIS — Z21 Asymptomatic human immunodeficiency virus [HIV] infection status: Secondary | ICD-10-CM | POA: Diagnosis not present

## 2015-02-05 DIAGNOSIS — E119 Type 2 diabetes mellitus without complications: Secondary | ICD-10-CM | POA: Diagnosis not present

## 2015-02-05 DIAGNOSIS — B20 Human immunodeficiency virus [HIV] disease: Secondary | ICD-10-CM | POA: Diagnosis not present

## 2015-02-05 DIAGNOSIS — J441 Chronic obstructive pulmonary disease with (acute) exacerbation: Secondary | ICD-10-CM | POA: Diagnosis not present

## 2015-02-05 MED ORDER — NYSTATIN 100000 UNIT/GM EX OINT: 1.0000 "application " | TOPICAL_OINTMENT | Freq: Two times a day (BID) | CUTANEOUS | Status: DC

## 2015-02-05 NOTE — Progress Notes (Signed)
Patient ID: Chris [name], male   DOB: 08-31-1965, 49 y.o.   MRN: [id number]       Patient ID: Chris [name], male   DOB: 02/03/1966, 49 y.o.   MRN: [id number]  RFV: sick visit  HPI 49 yo M with HIV disease, OSA, COPD, uncontrolled DM who reports noticing whitish exudate on penis. He denies dysuria or urethral discharge. He has also noticed having difficulty with maintaining an erection which pre-dates his current compliant. His PCP was concerned about drug interaction with the patient's current hiv regimen of stribild plus darunavir  Genotype:L74V,K101E,V118I,Y181C,G190S  Outpatient Encounter Prescriptions as of 02/05/2015  Medication Sig  . acetaminophen-codeine (TYLENOL #3) 300-30 MG per tablet Take 1 tablet by mouth every 4 (four) hours as needed.  [name] albuterol (PROVENTIL HFA;VENTOLIN HFA) 108 (90 BASE) MCG/ACT inhaler Inhale 2 puffs into the lungs every 4 (four) hours as needed for wheezing or shortness of breath (((PLAN B))).  [name] albuterol (PROVENTIL) (5 MG/ML) 0.5% nebulizer solution Take 0.5 mLs (2.5 mg total) by nebulization every 4 (four) hours as needed for wheezing or shortness of breath (((PLAN C))). (Patient taking differently: Take 2.5 mg by nebulization daily. )  . beclomethasone (QVAR) 80 MCG/ACT inhaler Inhale 2 puffs into the lungs 2 (two) times daily.  . Blood Glucose Monitoring Suppl (ACCU-CHEK AVIVA PLUS) W/DEVICE KIT 250 Test up to 4 times daily  . dapsone 100 MG tablet Take 1 tablet (100 mg total) by mouth daily.  . Darunavir Ethanolate (PREZISTA) 800 MG tablet Take 1 tablet (800 mg total) by mouth daily with breakfast.  . elvitegravir-cobicistat-emtricitabine-tenofovir (STRIBILD) 150-150-200-300 MG TABS tablet Take 1 tablet by mouth daily with breakfast.  . glucose blood test strip Use as instructed  . insulin glargine (LANTUS) 100 UNIT/ML injection Inject 0.1 mLs (10 Units total) into the skin at bedtime.  . Insulin Pen Needle 32G X 5 MM MISC Use as directed  . lisinopril  (PRINIVIL) 10 MG tablet Take 1 tablet (10 mg total) by mouth daily.  . metFORMIN (GLUCOPHAGE) 500 MG tablet Take 1 tablet (500 mg total) by mouth 2 (two) times daily with a meal.  . Tiotropium Bromide Monohydrate (SPIRIVA RESPIMAT) 2.5 MCG/ACT AERS Take 2 puffs by mouth every morning.  . triamcinolone cream (KENALOG) 0.1 % Apply 1 application topically 2 (two) times daily.   No facility-administered encounter medications on file as of 02/05/2015.     Patient Active Problem List   Diagnosis Date Noted  . Genital warts 01/22/2015  . Other male erectile dysfunction 01/22/2015  . Type 2 diabetes mellitus with complication 17/61/6073  . Pneumonia 12/14/2014  . HCAP (healthcare-associated pneumonia) 12/07/2014  . Essential hypertension, benign 12/04/2014  . Atopic eczema 12/04/2014  . Dyspnea 12/12/2013  . Acute on chronic respiratory failure 12/12/2013  . COPD exacerbation 08/23/2013  . Cyst (solitary) of breast 08/23/2013  . Breast abscess 08/23/2013  . OSA (obstructive sleep apnea) 08/23/2013  . Smoker 02/17/2013  . Hyperglycemia 02/17/2013  . Uncontrolled diabetes mellitus 02/17/2013  . DM (diabetes mellitus) 01/27/2013  . Steroid-induced hyperglycemia 01/12/2013  . Chronic respiratory failure 01/12/2013  . COPD  GOLD III, still smoking 01/11/2013  . Nicotine abuse 01/11/2013  . HIV disease 12/06/2012     Health Maintenance Due  Topic Date Due  . FOOT EXAM  04/12/1976  . OPHTHALMOLOGY EXAM  04/12/1976  . TETANUS/TDAP  04/12/1985     Review of Systems Review of Systems  Constitutional: Negative for fever, chills, diaphoresis, activity change, appetite change, fatigue  and unexpected weight change.  Genitourinary: Negative for dysuria, hematuria, flank pain and difficulty urinating.  Skin: exudate/rash to penis Psychiatric/Behavioral: depressed due to decreased libido.    Physical Exam   BP 111/70 mmHg  Pulse 90  Temp(Src) 98.2 F (36.8 C) (Oral)  Ht '5\' 9"'  (1.753 m)   Wt 275 lb (124.739 kg)  BMI 40.59 kg/m2  SpO2 93% gen = a xo by 3 in NAD gu = shaft of penis has whitish exudate that can rub off. No drainage from the urethra Lab Results  Component Value Date   CD4TCELL 9* 12/07/2014   Lab Results  Component Value Date   CD4TABS 190* 12/07/2014   CD4TABS 200* 09/21/2014   CD4TABS 180* 05/25/2014   Lab Results  Component Value Date   HIV1RNAQUANT <20 09/21/2014   Lab Results  Component Value Date   HEPBSAB REACTIVE* 12/06/2012   No results found for: RPR  CBC Lab Results  Component Value Date   WBC 6.5 12/14/2014   RBC 4.88 12/14/2014   HGB 14.0 12/14/2014   HCT 44.3 12/14/2014   PLT 289 12/14/2014   MCV 90.8 12/14/2014   MCH 28.7 12/14/2014   MCHC 31.6 12/14/2014   RDW 13.2 12/14/2014   LYMPHSABS 2.4 12/14/2014   MONOABS 0.9 12/14/2014   EOSABS 0.1 12/14/2014   BASOSABS 0.1 12/14/2014   BMET Lab Results  Component Value Date   NA 137 12/21/2014   K 4.3 12/21/2014   CL 101 12/21/2014   CO2 27 12/21/2014   GLUCOSE 200* 12/21/2014   BUN 11 12/21/2014   CREATININE 0.82 12/21/2014   CALCIUM 9.0 12/21/2014   GFRNONAA >89 12/21/2014   GFRAA >89 12/21/2014     Assessment and Plan  hiv disease= he has multiple NNRTI resistance, and currently on a great regimen that provides viral control. cobi is likely to increase drug concentration of viagra like agents. Will review his resistance to see if any options to change regimen to minimize drug interaction  Candidiasis of penis?  = will try nystatin cream bid to see if that helps. Keep area dry consider changing underwear more frequently  Erectile dysfunction = will see him back in 2 wks to see if can give agent to help with ED

## 2015-02-19 ENCOUNTER — Other Ambulatory Visit: Payer: Self-pay | Admitting: Internal Medicine

## 2015-02-19 DIAGNOSIS — B20 Human immunodeficiency virus [HIV] disease: Secondary | ICD-10-CM

## 2015-02-20 ENCOUNTER — Observation Stay (HOSPITAL_COMMUNITY)
Admission: EM | Admit: 2015-02-20 | Discharge: 2015-02-21 | Disposition: A | Discharge status: Home / Self Care | Payer: Medicaid Other | Attending: Surgery | Admitting: Surgery

## 2015-02-20 ENCOUNTER — Ambulatory Visit: Payer: Medicaid Other | Admitting: Internal Medicine

## 2015-02-20 ENCOUNTER — Encounter (HOSPITAL_COMMUNITY): Payer: Self-pay | Admitting: Emergency Medicine

## 2015-02-20 ENCOUNTER — Encounter (HOSPITAL_COMMUNITY)
Admission: EM | Disposition: A | Discharge status: Home / Self Care | Payer: Self-pay | Source: Home / Self Care | Attending: Emergency Medicine

## 2015-02-20 ENCOUNTER — Emergency Department (HOSPITAL_COMMUNITY): Payer: Medicaid Other | Admitting: Certified Registered Nurse Anesthetist

## 2015-02-20 DIAGNOSIS — J449 Chronic obstructive pulmonary disease, unspecified: Secondary | ICD-10-CM | POA: Insufficient documentation

## 2015-02-20 DIAGNOSIS — J439 Emphysema, unspecified: Secondary | ICD-10-CM | POA: Diagnosis not present

## 2015-02-20 DIAGNOSIS — E119 Type 2 diabetes mellitus without complications: Secondary | ICD-10-CM | POA: Insufficient documentation

## 2015-02-20 DIAGNOSIS — B20 Human immunodeficiency virus [HIV] disease: Secondary | ICD-10-CM | POA: Diagnosis not present

## 2015-02-20 DIAGNOSIS — I10 Essential (primary) hypertension: Secondary | ICD-10-CM | POA: Diagnosis not present

## 2015-02-20 DIAGNOSIS — F17210 Nicotine dependence, cigarettes, uncomplicated: Secondary | ICD-10-CM | POA: Diagnosis not present

## 2015-02-20 DIAGNOSIS — N61 Inflammatory disorders of breast: Secondary | ICD-10-CM | POA: Diagnosis not present

## 2015-02-20 DIAGNOSIS — N611 Abscess of the breast and nipple: Secondary | ICD-10-CM | POA: Diagnosis present

## 2015-02-20 HISTORY — PX: INCISION AND DRAINAGE ABSCESS: SHX5864

## 2015-02-20 LAB — CBC WITH DIFFERENTIAL/PLATELET
BASOS ABS: 0 10*3/uL (ref 0.0–0.1)
Basophils Relative: 0 % (ref 0–1)
Eosinophils Absolute: 0.2 10*3/uL (ref 0.0–0.7)
Eosinophils Relative: 3 % (ref 0–5)
HEMATOCRIT: 46.1 % (ref 39.0–52.0)
HEMOGLOBIN: 14.4 g/dL (ref 13.0–17.0)
Lymphocytes Relative: 29 % (ref 12–46)
Lymphs Abs: 2.3 10*3/uL (ref 0.7–4.0)
MCH: 29 pg (ref 26.0–34.0)
MCHC: 31.2 g/dL (ref 30.0–36.0)
MCV: 92.9 fL (ref 78.0–100.0)
MONOS PCT: 12 % (ref 3–12)
Monocytes Absolute: 0.9 10*3/uL (ref 0.1–1.0)
Neutro Abs: 4.4 10*3/uL (ref 1.7–7.7)
Neutrophils Relative %: 56 % (ref 43–77)
Platelets: 197 10*3/uL (ref 150–400)
RBC: 4.96 MIL/uL (ref 4.22–5.81)
RDW: 12.8 % (ref 11.5–15.5)
WBC: 7.8 10*3/uL (ref 4.0–10.5)

## 2015-02-20 LAB — BASIC METABOLIC PANEL
Anion gap: 6 (ref 5–15)
BUN: 8 mg/dL (ref 6–20)
CO2: 29 mmol/L (ref 22–32)
Calcium: 9 mg/dL (ref 8.9–10.3)
Chloride: 101 mmol/L (ref 101–111)
Creatinine, Ser: 0.82 mg/dL (ref 0.61–1.24)
GFR calc non Af Amer: >60 mL/min (ref >60)
Glucose, Bld: 115 mg/dL — ABNORMAL HIGH (ref 65–99)
POTASSIUM: 3.9 mmol/L (ref 3.5–5.1)
Sodium: 136 mmol/L (ref 135–145)

## 2015-02-20 LAB — GLUCOSE, CAPILLARY
GLUCOSE-CAPILLARY: 69 mg/dL (ref 65–99)
GLUCOSE-CAPILLARY: 185 mg/dL — AB (ref 65–99)
Glucose-Capillary: 103 mg/dL — ABNORMAL HIGH (ref 65–99)

## 2015-02-20 LAB — CBG MONITORING, ED: Glucose-Capillary: 126 mg/dL — ABNORMAL HIGH (ref 65–99)

## 2015-02-20 SURGERY — INCISION AND DRAINAGE, ABSCESS
Anesthesia: General | Site: Breast | Laterality: Right

## 2015-02-20 MED ORDER — DARUNAVIR ETHANOLATE 800 MG PO TABS
800.0000 mg | ORAL_TABLET | Freq: Every day | ORAL | Status: DC
  Administered 2015-02-21: 800 mg via ORAL
  Filled 2015-02-20: qty 1

## 2015-02-20 MED ORDER — PROPOFOL 10 MG/ML IV BOLUS
INTRAVENOUS | Status: DC | PRN
  Administered 2015-02-20: 200 mg via INTRAVENOUS

## 2015-02-20 MED ORDER — PHENYLEPHRINE 40 MCG/ML (10ML) SYRINGE FOR IV PUSH (FOR BLOOD PRESSURE SUPPORT)
PREFILLED_SYRINGE | INTRAVENOUS | Status: AC
  Filled 2015-02-20: qty 20

## 2015-02-20 MED ORDER — MIDAZOLAM HCL 2 MG/2ML IJ SOLN
INTRAMUSCULAR | Status: AC
  Filled 2015-02-20: qty 2

## 2015-02-20 MED ORDER — METFORMIN HCL 500 MG PO TABS
500.0000 mg | ORAL_TABLET | Freq: Two times a day (BID) | ORAL | Status: DC
  Administered 2015-02-21: 500 mg via ORAL
  Filled 2015-02-20: qty 1

## 2015-02-20 MED ORDER — FENTANYL CITRATE (PF) 100 MCG/2ML IJ SOLN
INTRAMUSCULAR | Status: DC | PRN
  Administered 2015-02-20: 50 ug via INTRAVENOUS

## 2015-02-20 MED ORDER — DIPHENHYDRAMINE HCL 12.5 MG/5ML PO ELIX
12.5000 mg | ORAL_SOLUTION | Freq: Four times a day (QID) | ORAL | Status: DC | PRN
Start: 2015-02-20 — End: 2015-02-21

## 2015-02-20 MED ORDER — INSULIN GLARGINE 100 UNIT/ML ~~LOC~~ SOLN
10.0000 [IU] | Freq: Every day | SUBCUTANEOUS | Status: DC
  Filled 2015-02-20: qty 0.1

## 2015-02-20 MED ORDER — ALBUTEROL SULFATE (2.5 MG/3ML) 0.083% IN NEBU
2.5000 mg | INHALATION_SOLUTION | Freq: Every day | RESPIRATORY_TRACT | Status: DC
  Administered 2015-02-20 – 2015-02-21 (×2): 2.5 mg via RESPIRATORY_TRACT
  Filled 2015-02-20 (×3): qty 3

## 2015-02-20 MED ORDER — VANCOMYCIN HCL IN DEXTROSE 1-5 GM/200ML-% IV SOLN
1000.0000 mg | Freq: Three times a day (TID) | INTRAVENOUS | Status: DC
  Administered 2015-02-20 – 2015-02-21 (×2): 1000 mg via INTRAVENOUS
  Filled 2015-02-20 (×4): qty 200

## 2015-02-20 MED ORDER — LACTATED RINGERS IV SOLN
INTRAVENOUS | Status: DC | PRN
  Administered 2015-02-20: 11:00:00 via INTRAVENOUS

## 2015-02-20 MED ORDER — OXYCODONE-ACETAMINOPHEN 5-325 MG PO TABS
1.0000 | ORAL_TABLET | ORAL | Status: DC | PRN
  Administered 2015-02-20 – 2015-02-21 (×2): 2 via ORAL
  Filled 2015-02-20 (×2): qty 2

## 2015-02-20 MED ORDER — HYDROMORPHONE HCL 1 MG/ML IJ SOLN
INTRAMUSCULAR | Status: AC
  Administered 2015-02-20: 0.5 mg via INTRAVENOUS
  Filled 2015-02-20: qty 1

## 2015-02-20 MED ORDER — PROPOFOL 10 MG/ML IV BOLUS
INTRAVENOUS | Status: AC
  Filled 2015-02-20: qty 20

## 2015-02-20 MED ORDER — MEPERIDINE HCL 25 MG/ML IJ SOLN: 6.2500 mg | INTRAMUSCULAR | Status: DC | PRN

## 2015-02-20 MED ORDER — PROMETHAZINE HCL 25 MG/ML IJ SOLN: 6.2500 mg | INTRAMUSCULAR | Status: DC | PRN

## 2015-02-20 MED ORDER — SODIUM CHLORIDE 0.9 % IV SOLN
INTRAVENOUS | Status: DC | PRN
  Administered 2015-02-20: 16:00:00 via INTRAVENOUS

## 2015-02-20 MED ORDER — FENTANYL CITRATE (PF) 250 MCG/5ML IJ SOLN
INTRAMUSCULAR | Status: AC
  Filled 2015-02-20: qty 5

## 2015-02-20 MED ORDER — ELVITEG-COBIC-EMTRICIT-TENOFAF 150-150-200-10 MG PO TABS
1.0000 | ORAL_TABLET | Freq: Every day | ORAL | Status: DC
  Filled 2015-02-20 (×3): qty 1

## 2015-02-20 MED ORDER — LACTATED RINGERS IV SOLN
INTRAVENOUS | Status: DC
  Administered 2015-02-20: 15:00:00 via INTRAVENOUS

## 2015-02-20 MED ORDER — ALBUTEROL SULFATE HFA 108 (90 BASE) MCG/ACT IN AERS: 2.0000 | INHALATION_SPRAY | RESPIRATORY_TRACT | Status: DC | PRN

## 2015-02-20 MED ORDER — SODIUM CHLORIDE 0.9 % IV BOLUS (SEPSIS)
1000.0000 mL | Freq: Once | INTRAVENOUS | Status: AC
  Administered 2015-02-20: 1000 mL via INTRAVENOUS

## 2015-02-20 MED ORDER — HYDROMORPHONE HCL 1 MG/ML IJ SOLN
1.0000 mg | INTRAMUSCULAR | Status: DC | PRN
  Administered 2015-02-21: 1 mg via INTRAVENOUS
  Filled 2015-02-20: qty 1

## 2015-02-20 MED ORDER — ALBUTEROL SULFATE (2.5 MG/3ML) 0.083% IN NEBU: 2.5000 mg | INHALATION_SOLUTION | RESPIRATORY_TRACT | Status: DC | PRN

## 2015-02-20 MED ORDER — BUPIVACAINE HCL (PF) 0.5 % IJ SOLN
INTRAMUSCULAR | Status: DC | PRN
  Administered 2015-02-20: 20 mL

## 2015-02-20 MED ORDER — KCL IN DEXTROSE-NACL 20-5-0.45 MEQ/L-%-% IV SOLN
INTRAVENOUS | Status: DC
  Administered 2015-02-20 – 2015-02-21 (×2): via INTRAVENOUS
  Filled 2015-02-20 (×2): qty 1000

## 2015-02-20 MED ORDER — TIOTROPIUM BROMIDE MONOHYDRATE 18 MCG IN CAPS
18.0000 ug | ORAL_CAPSULE | Freq: Every day | RESPIRATORY_TRACT | Status: DC
  Filled 2015-02-20 (×2): qty 5

## 2015-02-20 MED ORDER — DIPHENHYDRAMINE HCL 50 MG/ML IJ SOLN
12.5000 mg | Freq: Four times a day (QID) | INTRAMUSCULAR | Status: DC | PRN
Start: 2015-02-20 — End: 2015-02-21

## 2015-02-20 MED ORDER — DAPSONE 100 MG PO TABS
100.0000 mg | ORAL_TABLET | Freq: Every day | ORAL | Status: DC
  Administered 2015-02-20: 100 mg via ORAL
  Filled 2015-02-20 (×2): qty 1

## 2015-02-20 MED ORDER — OXYCODONE HCL 5 MG PO TABS: 5.0000 mg | ORAL_TABLET | ORAL | Status: DC | PRN

## 2015-02-20 MED ORDER — LISINOPRIL 10 MG PO TABS
10.0000 mg | ORAL_TABLET | Freq: Every day | ORAL | Status: DC
  Administered 2015-02-20: 10 mg via ORAL
  Filled 2015-02-20 (×2): qty 1

## 2015-02-20 MED ORDER — ONDANSETRON HCL 4 MG/2ML IJ SOLN: 4.0000 mg | Freq: Four times a day (QID) | INTRAMUSCULAR | Status: DC | PRN

## 2015-02-20 MED ORDER — PIPERACILLIN-TAZOBACTAM 3.375 G IVPB
3.3750 g | Freq: Three times a day (TID) | INTRAVENOUS | Status: DC
  Administered 2015-02-20 – 2015-02-21 (×3): 3.375 g via INTRAVENOUS
  Filled 2015-02-20 (×5): qty 50

## 2015-02-20 MED ORDER — VANCOMYCIN HCL 10 G IV SOLR
2000.0000 mg | Freq: Once | INTRAVENOUS | Status: AC
  Administered 2015-02-20: 2000 mg via INTRAVENOUS
  Filled 2015-02-20: qty 2000

## 2015-02-20 MED ORDER — BECLOMETHASONE DIPROPIONATE 80 MCG/ACT IN AERS: 2.0000 | INHALATION_SPRAY | Freq: Two times a day (BID) | RESPIRATORY_TRACT | Status: DC

## 2015-02-20 MED ORDER — BUDESONIDE 0.5 MG/2ML IN SUSP
0.2500 mg | Freq: Two times a day (BID) | RESPIRATORY_TRACT | Status: DC
  Administered 2015-02-20: 0.25 mg via RESPIRATORY_TRACT
  Filled 2015-02-20 (×2): qty 2

## 2015-02-20 MED ORDER — LIDOCAINE HCL (CARDIAC) 20 MG/ML IV SOLN
INTRAVENOUS | Status: DC | PRN
  Administered 2015-02-20: 50 mg via INTRAVENOUS

## 2015-02-20 MED ORDER — MIDAZOLAM HCL 5 MG/5ML IJ SOLN
INTRAMUSCULAR | Status: DC | PRN
  Administered 2015-02-20: 2 mg via INTRAVENOUS

## 2015-02-20 MED ORDER — MORPHINE SULFATE 2 MG/ML IJ SOLN: 1.0000 mg | INTRAMUSCULAR | Status: DC | PRN

## 2015-02-20 MED ORDER — HYDROMORPHONE HCL 1 MG/ML IJ SOLN
0.2500 mg | INTRAMUSCULAR | Status: DC | PRN
  Administered 2015-02-20 (×2): 0.5 mg via INTRAVENOUS

## 2015-02-20 MED ORDER — INSULIN ASPART 100 UNIT/ML ~~LOC~~ SOLN: 0.0000 [IU] | Freq: Three times a day (TID) | SUBCUTANEOUS | Status: DC

## 2015-02-20 MED ORDER — 0.9 % SODIUM CHLORIDE (POUR BTL) OPTIME
TOPICAL | Status: DC | PRN
  Administered 2015-02-20: 1000 mL

## 2015-02-20 MED ORDER — ONDANSETRON HCL 4 MG/2ML IJ SOLN
INTRAMUSCULAR | Status: DC | PRN
  Administered 2015-02-20: 4 mg via INTRAVENOUS

## 2015-02-20 SURGICAL SUPPLY — 30 items
BNDG GAUZE ELAST 4 BULKY (GAUZE/BANDAGES/DRESSINGS) IMPLANT
CANISTER SUCTION 2500CC (MISCELLANEOUS) ×2 IMPLANT
COVER SURGICAL LIGHT HANDLE (MISCELLANEOUS) ×2 IMPLANT
DRAPE LAPAROSCOPIC ABDOMINAL (DRAPES) ×2 IMPLANT
DRAPE UTILITY XL STRL (DRAPES) IMPLANT
DRSG PAD ABDOMINAL 8X10 ST (GAUZE/BANDAGES/DRESSINGS) ×2 IMPLANT
ELECT CAUTERY BLADE 6.4 (BLADE) ×2 IMPLANT
ELECT REM PT RETURN 9FT ADLT (ELECTROSURGICAL) ×2
ELECTRODE REM PT RTRN 9FT ADLT (ELECTROSURGICAL) ×1 IMPLANT
GAUZE SPONGE 4X4 12PLY STRL (GAUZE/BANDAGES/DRESSINGS) IMPLANT
GLOVE BIO SURGEON STRL SZ 6.5 (GLOVE) ×2 IMPLANT
GLOVE BIOGEL PI IND STRL 6.5 (GLOVE) ×1 IMPLANT
GLOVE BIOGEL PI INDICATOR 6.5 (GLOVE) ×1
GLOVE SURG SIGNA 7.5 PF LTX (GLOVE) ×2 IMPLANT
GOWN STRL REUS W/ TWL LRG LVL3 (GOWN DISPOSABLE) ×1 IMPLANT
GOWN STRL REUS W/ TWL XL LVL3 (GOWN DISPOSABLE) ×1 IMPLANT
GOWN STRL REUS W/TWL LRG LVL3 (GOWN DISPOSABLE) ×1
GOWN STRL REUS W/TWL XL LVL3 (GOWN DISPOSABLE) ×1
KIT BASIN OR (CUSTOM PROCEDURE TRAY) ×2 IMPLANT
KIT ROOM TURNOVER OR (KITS) ×2 IMPLANT
NS IRRIG 1000ML POUR BTL (IV SOLUTION) ×2 IMPLANT
PACK GENERAL/GYN (CUSTOM PROCEDURE TRAY) ×2 IMPLANT
PAD ARMBOARD 7.5X6 YLW CONV (MISCELLANEOUS) ×4 IMPLANT
SPECIMEN JAR SMALL (MISCELLANEOUS) IMPLANT
SPONGE GAUZE 4X4 12PLY STER LF (GAUZE/BANDAGES/DRESSINGS) ×2 IMPLANT
SWAB COLLECTION DEVICE MRSA (MISCELLANEOUS) ×2 IMPLANT
TAPE CLOTH SURG 6X10 WHT LF (GAUZE/BANDAGES/DRESSINGS) ×2 IMPLANT
TOWEL OR 17X24 6PK STRL BLUE (TOWEL DISPOSABLE) ×2 IMPLANT
TOWEL OR 17X26 10 PK STRL BLUE (TOWEL DISPOSABLE) ×2 IMPLANT
TUBE ANAEROBIC SPECIMEN COL (MISCELLANEOUS) ×2 IMPLANT

## 2015-02-20 NOTE — Transfer of Care (Signed)
Immediate Anesthesia Transfer of Care Note  Patient: Chris [name]  Procedure(s) Performed: Procedure(s): INCISION AND DRAINAGE Right Breast Abscess (Right)  Patient Location: PACU  Anesthesia Type:General  Level of Consciousness: awake  Airway & Oxygen Therapy: Patient Spontanous Breathing  Post-op Assessment: Report given to RN and Post -op Vital signs reviewed and stable  Post vital signs: Reviewed and stable  Last Vitals:  Filed Vitals:   02/20/15 1430  BP: 153/80  Pulse: 83  Temp:   Resp:     Complications: No apparent anesthesia complications

## 2015-02-20 NOTE — ED Notes (Signed)
Acuity changed per request of PA

## 2015-02-20 NOTE — Progress Notes (Signed)
Received patient post I&D right breast,dressing clean ,dry, and intact alert and oriented, not in any distress, VSS,Will endorse appropriately.

## 2015-02-20 NOTE — Op Note (Signed)
INCISION AND DRAINAGE Right Breast Abscess  Procedure Note  Chris [name] [name] 02/20/2015   Pre-op Diagnosis: Right Breast Abscess     Post-op Diagnosis: same  Procedure(s): INCISION AND DRAINAGE Right Breast Abscess  Surgeon(s): Abigail Miyamotoouglas Tylasia Fletchall, MD  Anesthesia: General  Staff:  Circulator: Heriberto Antiguaynthia G Bick, RN Scrub Person: Clayton BiblesNatalie McBride, RN  Estimated Blood Loss: Minimal               Cultures obtained          Chris [name]   Date: 02/20/2015  Time: 4:35 PM
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
## 2015-02-20 NOTE — Progress Notes (Signed)
ANTIBIOTIC CONSULT NOTE - INITIAL  Pharmacy Consult for Vancomycin Indication: Cellulitis  Allergies  Allergen Reactions  . Bactrim [Sulfamethoxazole-Trimethoprim] Hives    Patient Measurements: Weight: 275 lb 3.2 oz (124.83 kg)  Vital Signs: Temp: 98.3 F (36.8 C) (07/12 1007) Temp Source: Oral (07/12 1007) BP: 118/67 mmHg (07/12 1215) Pulse Rate: 82 (07/12 1215) Intake/Output from previous day:   Intake/Output from this shift:    Labs:  Recent Labs  02/20/15 1125  WBC 7.8  HGB 14.4  PLT 197  CREATININE 0.82   Estimated Creatinine Clearance: 143.8 mL/min (by C-G formula based on Cr of 0.82). No results for input(s): VANCOTROUGH, VANCOPEAK, VANCORANDOM, GENTTROUGH, GENTPEAK, GENTRANDOM, TOBRATROUGH, TOBRAPEAK, TOBRARND, AMIKACINPEAK, AMIKACINTROU, AMIKACIN in the last 72 hours.   Microbiology: No results found for this or any previous visit (from the past 720 hour(s)).  Medical History: Past Medical History  Diagnosis Date  . COPD (chronic obstructive pulmonary disease)   . HIV disease   . Emphysema   . Diabetes mellitus without complication     Assessment: 49 yo M presents on 7/12 with abscess. PMH includes HIV, DM, COPD. Pharmacy consulted to dose vancomycin for recurrent R breast abscess. Afebrile, WBC wnl. SCr stable. Normalized CrCl > 13700ml/min.  Goal of Therapy:  Vancomycin trough level 10-15 mcg/ml  Resolution of infection   Plan:  Give vancomycin 2g IV x 1, then start 1g IV Q8 Monitor clinical picture, renal function, VT at Css F/U C&S, abx deescalation / LOT   Anabell Swint J 02/20/2015,1:21 PM

## 2015-02-20 NOTE — Anesthesia Postprocedure Evaluation (Signed)
  Anesthesia Post-op Note  Patient: Chris FentonJerome [name]  Procedure(s) Performed: Procedure(s): INCISION AND DRAINAGE Right Breast Abscess (Right)  Patient Location: PACU  Anesthesia Type:General  Level of Consciousness: awake, alert  and oriented  Airway and Oxygen Therapy: Patient Spontanous Breathing  Post-op Pain: mild  Post-op Assessment: Post-op Vital signs reviewed, Patient's Cardiovascular Status Stable, Respiratory Function Stable, Patent Airway and Pain level controlled              Post-op Vital Signs: stable  Last Vitals:  Filed Vitals:   02/20/15 2039  BP: 154/67  Pulse: 86  Temp: 36.2 C  Resp: 18    Complications: No apparent anesthesia complications

## 2015-02-20 NOTE — ED Provider Notes (Signed)
MSE was initiated and I personally evaluated the patient and placed orders (if any) at  10:33 AM on February 20, 2015.  The patient appears stable so that the remainder of the MSE may be completed by another provider.  Patient is a 49 year old male past medical history significant for DM, HIV with undetectable viral loads resenting to the emergency department for abscess to right chest wall near nipple. He states he has had recurrence, required surgery 1 year ago. He states the symptoms started 1 week ago. Endorses severe pain to the area but denies any discharge. He is unsure about any fevers.  Large indurated area medial to the nipple. No nipple discharge. No overlying warmth. Mild erythema.  Will obtain basic screening labs and with patient to acute bed. Patient d/w with Dr. Criss AlvineGoldston, agrees with plan.    Francee PiccoloJennifer Skylen Spiering, PA-C 02/20/15 1050  Pricilla LovelessScott Goldston, MD 02/20/15 1113

## 2015-02-20 NOTE — Anesthesia Procedure Notes (Signed)
Procedure Name: LMA Insertion Date/Time: 02/20/2015 4:21 PM Performed by: Gavin PoundLOWDER, Kazuma Elena J Pre-anesthesia Checklist: Patient identified, Timeout performed, Emergency Drugs available, Suction available and Patient being monitored Patient Re-evaluated:Patient Re-evaluated prior to inductionOxygen Delivery Method: Circle system utilized Preoxygenation: Pre-oxygenation with 100% oxygen Intubation Type: IV induction Ventilation: Mask ventilation without difficulty LMA: LMA inserted LMA Size: 5.0 Number of attempts: 1 Placement Confirmation: positive ETCO2 and breath sounds checked- equal and bilateral Tube secured with: Tape Dental Injury: Teeth and Oropharynx as per pre-operative assessment

## 2015-02-20 NOTE — Anesthesia Preprocedure Evaluation (Addendum)
Anesthesia Evaluation  Patient identified by MRN, date of birth, ID band Patient awake    Reviewed: Allergy & Precautions, NPO status , Patient's Chart, lab work & pertinent test results  Airway Mallampati: II  TM Distance: >3 FB Neck ROM: Full    Dental no notable dental hx.    Pulmonary shortness of breath, sleep apnea , pneumonia -, COPDCurrent Smoker,  breath sounds clear to auscultation  Pulmonary exam normal       Cardiovascular hypertension, Pt. on medications Normal cardiovascular examRhythm:Regular Rate:Normal     Neuro/Psych negative neurological ROS  negative psych ROS   GI/Hepatic negative GI ROS, Neg liver ROS,   Endo/Other  diabetes, Type 2, Oral Hypoglycemic AgentsMorbid obesity  Renal/GU negative Renal ROS     Musculoskeletal negative musculoskeletal ROS (+)   Abdominal (+) + obese,   Peds  Hematology negative hematology ROS (+)   Anesthesia Other Findings   Reproductive/Obstetrics                          Anesthesia Physical Anesthesia Plan  ASA: III  Anesthesia Plan: General   Post-op Pain Management:    Induction: Intravenous  Airway Management Planned: Oral ETT  Additional Equipment: None  Intra-op Plan:   Post-operative Plan: Extubation in OR  Informed Consent: I have reviewed the patients History and Physical, chart, labs and discussed the procedure including the risks, benefits and alternatives for the proposed anesthesia with the patient or authorized representative who has indicated his/her understanding and acceptance.   Dental advisory given  Plan Discussed with: CRNA  Anesthesia Plan Comments: (+/- glidescope)      Anesthesia Quick Evaluation                                  Anesthesia Evaluation  Patient identified by MRN, date of birth, ID band Patient awake    Reviewed: Allergy & Precautions, H&P , NPO status , Patient's Chart, lab work  & pertinent test results  Airway Mallampati: III TM Distance: >3 FB Neck ROM: Full    Dental no notable dental hx. (+) Teeth Intact and Dental Advisory Given   Pulmonary COPD COPD inhaler and oxygen dependent, Current Smoker,  breath sounds clear to auscultation  Pulmonary exam normal       Cardiovascular negative cardio ROS  Rhythm:Regular Rate:Normal     Neuro/Psych negative neurological ROS  negative psych ROS   GI/Hepatic negative GI ROS, Neg liver ROS,   Endo/Other  diabetes, Poorly Controlled, Type 1, Insulin Dependent  Renal/GU negative Renal ROS  negative genitourinary   Musculoskeletal   Abdominal   Peds  Hematology  (+) HIV,   Anesthesia Other Findings   Reproductive/Obstetrics negative OB ROS                        Anesthesia Physical Anesthesia Plan  ASA: III  Anesthesia Plan: General   Post-op Pain Management:    Induction: Intravenous  Airway Management Planned: LMA  Additional Equipment:   Intra-op Plan:   Post-operative Plan: Extubation in OR  Informed Consent: I have reviewed the patients History and Physical, chart, labs and discussed the procedure including the risks, benefits and alternatives for the proposed anesthesia with the patient or authorized representative who has indicated his/her understanding and acceptance.   Dental advisory given  Plan Discussed with: CRNA  Anesthesia Plan Comments:  Anesthesia Quick Evaluation
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
## 2015-02-20 NOTE — ED Notes (Signed)
Pt. Stated, i have another abscess on my rt. breast

## 2015-02-21 ENCOUNTER — Encounter (HOSPITAL_COMMUNITY): Payer: Self-pay | Admitting: Surgery

## 2015-02-21 LAB — GLUCOSE, CAPILLARY: GLUCOSE-CAPILLARY: 140 mg/dL — AB (ref 65–99)

## 2015-02-21 MED ORDER — CLINDAMYCIN HCL 300 MG PO CAPS: 300.0000 mg | ORAL_CAPSULE | Freq: Four times a day (QID) | ORAL | Status: DC

## 2015-02-21 MED ORDER — OXYCODONE-ACETAMINOPHEN 5-325 MG PO TABS: 1.0000 | ORAL_TABLET | Freq: Four times a day (QID) | ORAL | Status: DC | PRN

## 2015-02-21 NOTE — Progress Notes (Signed)
Pt was being d/c and wanted to leave. Pt.s car was at hospital and needed someone to drive for him due to medication. Pt insisted that he wanted to leave before meds worn off. Chaplain was asked to intervene and help nurse. Chaplain explained the risk  involved and the Pt understood and called spouse. Pt was d/c.   02/21/15 1100  Clinical Encounter Type  Visited With Patient  Visit Type Spiritual support  Referral From Nurse  Spiritual Encounters  Spiritual Needs Emotional

## 2015-02-21 NOTE — Discharge Instructions (Signed)
Abscess Care After An abscess (also called a boil or furuncle) is an infected area that contains a collection of pus. Signs and symptoms of an abscess include pain, tenderness, redness, or hardness, or you may feel a moveable soft area under your skin. An abscess can occur anywhere in the body. The infection may spread to surrounding tissues causing cellulitis. A cut (incision) by the surgeon was made over your abscess and the pus was drained out. Gauze may have been packed into the space to provide a drain that will allow the cavity to heal from the inside outwards. The boil may be painful for 5 to 7 days. Most people with a boil do not have high fevers. Your abscess, if seen early, may not have localized, and may not have been lanced. If not, another appointment may be required for this if it does not get better on its own or with medications. HOME CARE INSTRUCTIONS   Only take over-the-counter or prescription medicines for pain, discomfort, or fever as directed by your caregiver.  When you bathe, soak and then remove gauze or iodoform packs at least daily or as directed by your caregiver. You may then wash the wound gently with mild soapy water. Repack with gauze or do as your caregiver directs. SEEK IMMEDIATE MEDICAL CARE IF:   You develop increased pain, swelling, redness, drainage, or bleeding in the wound site.  You develop signs of generalized infection including muscle aches, chills, fever, or a general ill feeling.  An oral temperature above 102 F (38.9 C) develops, not controlled by medication. See your caregiver for a recheck if you develop any of the symptoms described above. If medications (antibiotics) were prescribed, take them as directed. Document Released: 02/13/2005 Document Revised: 10/20/2011 Document Reviewed: 10/11/2007 [facility] Patient Information 2015 [place]. This information is not intended to replace advice given to you by your health care provider. Make sure  you discuss any questions you have with your health care provider.  Dressing Change A dressing is a material placed over wounds. It keeps the wound clean, dry, and protected from further injury.  BEFORE YOU BEGIN  Get your supplies together. Things you may need include:  Salt solution (saline).  Flexible gauze bandage.  Medicated cream.  Tape.  Gloves.  Belly (abdominal) pads.  Gauze squares.  Plastic bags.  Take pain medicine 30 minutes before the bandage change if you need it.  Take a shower before you do the first bandage change of the day. Put plastic wrap or a bag over the dressing. REMOVING YOUR OLD BANDAGE  Wash your hands with soap and water. Dry your hands with a clean towel.  Put on your gloves.  Remove any tape.  Remove the old bandage as told. If it sticks, put a small amount of warm water on it to loosen the bandage.  Remove any gauze or packing tape in your wound.  Take off your gloves.  Put the gloves, tape, gauze, or any packing tape in a plastic bag. CHANGING YOUR BANDAGE  Open the supplies.  Take the cap off the salt solution.  Open the gauze. Leave the gauze on the inside of the package.  Put on your gloves.  Clean your wound as told by your doctor.  Keep your wound dry if your doctor told you to do so.  Your doctor may tell you to do one or more of the following:  Pick up the gauze. Pour the salt solution over the gauze. Squeeze out  the extra salt solution.  Put medicated cream or other medicine on your wound.  Put solution soaked gauze only in your wound, not on the skin around it.  Pack your wound loosely.  Put dry gauze on your wound.  Put belly pads over the dry gauze if your bandages soak through.  Tape the bandage in place so it will not fall off. Do not wrap the tape all the way around your arm or leg.  Wrap the bandage with the flexible gauze bandage as told by your doctor.  Take off your gloves. Put them in the  plastic bag with the old bandage. Tie the bag shut and throw it away.  Keep the bandage clean and dry.  Wash your hands. GET HELP RIGHT AWAY IF:   Your skin around the wound looks red.  Your wound feels more tender or sore.  You see yellowish-white fluid (pus) in the wound.  Your wound smells bad.  You have a fever.  Your skin around the wound has a red rash that itches and burns.  You see black or yellow skin in your wound that was not there before.  You feel sick to your stomach (nauseous), throw up (vomit), and feel very tired. Document Released: 10/24/2008 Document Revised: 12/12/2013 Document Reviewed: 06/08/2011 [facility] Patient Information 2015 [place], [place]. This information is not intended to replace advice given to you by your health care provider. Make sure you discuss any questions you have with your health care provider.

## 2015-02-21 NOTE — Progress Notes (Signed)
Dressing change to right breast, pre medicated prior to dressing change per MD. After dressing change, patient wants to go home, talk with tha patient that he just received pain med and that it is not safe to drive while on pain med. Patient very upset and stating we are trying to hold him. PA offered bus pass but patient refused.Chaplain assisted staff in convincing him to call family for discharge.

## 2015-02-21 NOTE — Op Note (Signed)
NAMCharisse [name]:  Nudelman, Hammond                 ACCOUNT NO.:  [id number]  MEDICAL RECORD NO.:  [id number]  LOCATION:  [id number]                        FACILITY:  [facility]  PHYSICIAN:  Abigail Miyamotoouglas Nasier Thumm, M.D. DATE OF BIRTH:  01/11/1966  DATE OF PROCEDURE:  02/20/2015 DATE OF DISCHARGE:                              OPERATIVE REPORT   PREOPERATIVE DIAGNOSIS:  Right breast abscess.  POSTOPERATIVE DIAGNOSIS:  Right breast abscess.  PROCEDURE:  Incision and drainage of right breast abscess.  SURGEON:  Abigail Miyamotoouglas Stryder Poitra, MD  ANESTHESIA:  General and 0.5% Marcaine.  ESTIMATED BLOOD LOSS:  Minimal.  PROCEDURE IN DETAIL:  The patient was brought to the operating room, [facility]  He was placed supine on the operating table and general anesthesia was induced.  Right chest was prepped and draped in usual sterile fashion.  I anesthetized the previous incision next to the areola with Marcaine.  I then made the incision through this with scalpel and entered an abscess cavity.  Purulence was cultured.  I then irrigated all the purulence out of the cavity.  I then achieved hemostasis with cautery.  I anesthetized the wound further with Marcaine.  I irrigated further with saline.  I then packed it with wet- to-dry normal saline soaked gauze.  Dry gauze and ABD were placed over the top of this as well as tape.  The patient tolerated the procedure well.  All the counts were correct at the end of the procedure.  The patient was then extubated in the operating room and taken in stable condition to recovery room.     Abigail Miyamotoouglas Charl Wellen, M.D.   ______________________________ Abigail Miyamotoouglas Bradie Lacock, M.D.    DB/MEDQ  D:  02/20/2015  T:  02/21/2015  Job:  [id number]

## 2015-02-21 NOTE — Progress Notes (Signed)
Patient called staff and stated wife is at the lobby waiting . Discharge patient home. Home discharge instruction given, no question verbalized.

## 2015-02-21 NOTE — Discharge Summary (Signed)
[facility] Discharge Summary   Patient ID: Chris [name] MRN: [id number] DOB/AGE: 1966/07/10 49 y.o.  Admit date: 02/20/2015 Discharge date: 02/21/2015  Admitting Diagnosis: Right breast abscess   Discharge Diagnosis Patient Active Problem List   Diagnosis Date Noted  . Abscess of right breast 02/20/2015  . Genital warts 01/22/2015  . Other male erectile dysfunction 01/22/2015  . Type 2 diabetes mellitus with complication 17/61/6073  . Pneumonia 12/14/2014  . HCAP (healthcare-associated pneumonia) 12/07/2014  . Essential hypertension, benign 12/04/2014  . Atopic eczema 12/04/2014  . Dyspnea 12/12/2013  . Acute on chronic respiratory failure 12/12/2013  . COPD exacerbation 08/23/2013  . Cyst (solitary) of breast 08/23/2013  . Breast abscess 08/23/2013  . OSA (obstructive sleep apnea) 08/23/2013  . Smoker 02/17/2013  . Hyperglycemia 02/17/2013  . Uncontrolled diabetes mellitus 02/17/2013  . DM (diabetes mellitus) 01/27/2013  . Steroid-induced hyperglycemia 01/12/2013  . Chronic respiratory failure 01/12/2013  . COPD  GOLD III, still smoking 01/11/2013  . Nicotine abuse 01/11/2013  . HIV disease 12/06/2012    Consultants None  Imaging: No results found.  Procedures Dr. Ninfa Linden (02/20/15) - Incision and drainage of left breast abscess  Hospital Course:  49 yo black male with HIV, COPD, DM who presented to the ED today with a week long h/o recurrent right breast pain. He has had 3 prior abscesses to this same breast. 2 were I&D in [place] and one 1.5 year ago by Dr. Donne Hazel.  The patient continues to smoke, but has decreased to about 5 cigarettes a day. About a week ago he began developing pain and erythema again. He denies fevers, but has had some nipple drainage. His pain finally worsened enough that he came to the ED today for further evaluation. His labs are normal. We have been asked to see him for admission.   Workup showed right breast  abscess.  Patient was admitted and underwent procedure listed above.  Tolerated procedure well and was transferred to the floor.  Diet was advanced as tolerated.  The patient wants to pack his wound himself since he has done it in the past.  We recommend twice a day wet to dry dressing changes to his right breast wound.  On POD #1, the patient was voiding well, tolerating diet, ambulating well, pain well controlled, vital signs stable, wound clean with sanguinous drainage only and felt stable for discharge home.  Patient will follow up in our office in 2-3 weeks and knows to call with questions or concerns.  The patient wants to leave, but he has no ride.  We offered him a bus pass but he has refused.  He has just received pain meds, so we recommended he remain in the hospital until the pain meds wear off.  He will continue oral antibiotics at home for 7 days.    Physical Exam: General:  Alert, NAD, not pleasant Right breast:  3cm incision with 3cm depth and flap towards nipple, wound bed is clean without purulent drainage, no significant surrounding erythema and edema     Medication List    STOP taking these medications        acetaminophen-codeine 300-30 MG per tablet  Commonly known as:  TYLENOL #3      TAKE these medications        ACCU-CHEK AVIVA PLUS W/DEVICE Kit  250 Test up to 4 times daily     albuterol (5 MG/ML) 0.5% nebulizer solution  Commonly known as:  PROVENTIL  Take 0.5 mLs (  2.5 mg total) by nebulization every 4 (four) hours as needed for wheezing or shortness of breath (((PLAN C))).     albuterol 108 (90 BASE) MCG/ACT inhaler  Commonly known as:  PROVENTIL HFA;VENTOLIN HFA  Inhale 2 puffs into the lungs every 4 (four) hours as needed for wheezing or shortness of breath (((PLAN B))).     beclomethasone 80 MCG/ACT inhaler  Commonly known as:  QVAR  Inhale 2 puffs into the lungs 2 (two) times daily.     clindamycin 300 MG capsule  Commonly known as:  CLEOCIN  Take 1  capsule (300 mg total) by mouth 4 (four) times daily.     dapsone 100 MG tablet  TAKE 1 TABLET BY MOUTH DAILY     Darunavir Ethanolate 800 MG tablet  Commonly known as:  PREZISTA  Take 1 tablet (800 mg total) by mouth daily with breakfast.     elvitegravir-cobicistat-emtricitabine-tenofovir 150-150-200-300 MG Tabs tablet  Commonly known as:  STRIBILD  Take 1 tablet by mouth daily with breakfast.     glucose blood test strip  Use as instructed     insulin glargine 100 UNIT/ML injection  Commonly known as:  LANTUS  Inject 0.1 mLs (10 Units total) into the skin at bedtime.     Insulin Pen Needle 32G X 5 MM Misc  Use as directed     lisinopril 10 MG tablet  Commonly known as:  PRINIVIL  Take 1 tablet (10 mg total) by mouth daily.     metFORMIN 500 MG tablet  Commonly known as:  GLUCOPHAGE  Take 1 tablet (500 mg total) by mouth 2 (two) times daily with a meal.     nystatin ointment  Commonly known as:  MYCOSTATIN  Apply 1 application topically 2 (two) times daily. To affected area     oxyCODONE-acetaminophen 5-325 MG per tablet  Commonly known as:  PERCOCET/ROXICET  Take 1-2 tablets by mouth every 6 (six) hours as needed for moderate pain.     Tiotropium Bromide Monohydrate 2.5 MCG/ACT Aers  Commonly known as:  SPIRIVA RESPIMAT  Take 2 puffs by mouth every morning.     triamcinolone cream 0.1 %  Commonly known as:  KENALOG  Apply 1 application topically 2 (two) times daily.         Follow-up Information    Follow up with [facility], MD. Schedule an appointment as soon as possible for a visit in 3 weeks.   Specialty:  General Surgery   Why:  For post-operation check in 2-3 weeks with Dr. Ninfa Linden.  Call the office to confirm the date and time.     Contact information:   [place] [address] [phone]       Signed: Nat Christen, [facility] [phone]  02/21/2015, 9:21 AM   \

## 2015-02-24 LAB — CULTURE, ROUTINE-ABSCESS: CULTURE: NO GROWTH

## 2015-02-25 LAB — ANAEROBIC CULTURE

## 2015-02-27 ENCOUNTER — Encounter: Payer: Self-pay | Admitting: Internal Medicine

## 2015-02-27 ENCOUNTER — Ambulatory Visit (INDEPENDENT_AMBULATORY_CARE_PROVIDER_SITE_OTHER): Payer: Medicaid Other | Admitting: Internal Medicine

## 2015-02-27 VITALS — BP 121/77 | HR 90 | Temp 97.5°F | Wt 273.0 lb

## 2015-02-27 DIAGNOSIS — Z794 Long term (current) use of insulin: Secondary | ICD-10-CM | POA: Diagnosis not present

## 2015-02-27 DIAGNOSIS — Z21 Asymptomatic human immunodeficiency virus [HIV] infection status: Secondary | ICD-10-CM | POA: Diagnosis not present

## 2015-02-27 DIAGNOSIS — E119 Type 2 diabetes mellitus without complications: Secondary | ICD-10-CM | POA: Diagnosis not present

## 2015-02-27 DIAGNOSIS — N521 Erectile dysfunction due to diseases classified elsewhere: Secondary | ICD-10-CM

## 2015-02-27 DIAGNOSIS — B20 Human immunodeficiency virus [HIV] disease: Secondary | ICD-10-CM | POA: Diagnosis not present

## 2015-02-27 DIAGNOSIS — R6882 Decreased libido: Secondary | ICD-10-CM | POA: Diagnosis not present

## 2015-02-27 DIAGNOSIS — J449 Chronic obstructive pulmonary disease, unspecified: Secondary | ICD-10-CM

## 2015-02-27 DIAGNOSIS — J441 Chronic obstructive pulmonary disease with (acute) exacerbation: Secondary | ICD-10-CM | POA: Diagnosis not present

## 2015-02-27 LAB — LIPID PANEL
CHOL/HDL RATIO: 8.3 ratio
CHOLESTEROL: 233 mg/dL — AB (ref 0–200)
HDL: 28 mg/dL — AB (ref >=40)
LDL Cholesterol: 178 mg/dL — ABNORMAL HIGH (ref 0–99)
Triglycerides: 136 mg/dL (ref <150)
VLDL: 27 mg/dL (ref 0–40)

## 2015-02-27 LAB — COMPLETE METABOLIC PANEL WITH GFR
ALT: 22 U/L (ref 0–53)
AST: 19 U/L (ref 0–37)
Albumin: 3.9 g/dL (ref 3.5–5.2)
Alkaline Phosphatase: 38 U/L — ABNORMAL LOW (ref 39–117)
BILIRUBIN TOTAL: 0.4 mg/dL (ref 0.2–1.2)
BUN: 12 mg/dL (ref 6–23)
CALCIUM: 9.3 mg/dL (ref 8.4–10.5)
CHLORIDE: 100 meq/L (ref 96–112)
CO2: 29 mEq/L (ref 19–32)
CREATININE: 0.85 mg/dL (ref 0.50–1.35)
GFR, Est Non African American: >89 mL/min
Glucose, Bld: 109 mg/dL — ABNORMAL HIGH (ref 70–99)
Potassium: 4.3 mEq/L (ref 3.5–5.3)
Sodium: 137 mEq/L (ref 135–145)
TOTAL PROTEIN: 8.5 g/dL — AB (ref 6.0–8.3)

## 2015-02-27 LAB — CBC WITH DIFFERENTIAL/PLATELET
BASOS PCT: 1 % (ref 0–1)
Basophils Absolute: 0.1 10*3/uL (ref 0.0–0.1)
EOS PCT: 2 % (ref 0–5)
Eosinophils Absolute: 0.1 10*3/uL (ref 0.0–0.7)
HCT: 44.2 % (ref 39.0–52.0)
Hemoglobin: 14.7 g/dL (ref 13.0–17.0)
LYMPHS ABS: 2.4 10*3/uL (ref 0.7–4.0)
Lymphocytes Relative: 36 % (ref 12–46)
MCH: 29.6 pg (ref 26.0–34.0)
MCHC: 33.3 g/dL (ref 30.0–36.0)
MCV: 88.9 fL (ref 78.0–100.0)
MONOS PCT: 12 % (ref 3–12)
MPV: 10.5 fL (ref 8.6–12.4)
Monocytes Absolute: 0.8 10*3/uL (ref 0.1–1.0)
Neutro Abs: 3.2 10*3/uL (ref 1.7–7.7)
Neutrophils Relative %: 49 % (ref 43–77)
PLATELETS: 207 10*3/uL (ref 150–400)
RBC: 4.97 MIL/uL (ref 4.22–5.81)
RDW: 13.4 % (ref 11.5–15.5)
WBC: 6.6 10*3/uL (ref 4.0–10.5)

## 2015-02-27 MED ORDER — SILDENAFIL CITRATE 50 MG PO TABS: 25.0000 mg | ORAL_TABLET | Freq: Every day | ORAL | Status: DC | PRN

## 2015-02-27 NOTE — Progress Notes (Signed)
Subjective:    Patient ID: Chris [name], male    DOB: 1966-06-20, 49 y.o.   MRN: [id number]  HPI Mr. Maese is a 49 y/o male with Hx of COPD, DM, OSA, HIV who last CD4 count of 190 and VL<20. He does have resistant virus as noted below.  He is doing well on his current regimen of Prezista and Stribild. He has not missed any doses. He is also on Dapsone for OI prophylaxis. His o2 sats on this visit were 86% and we put him on 2L [place] here. Last month he states his Pulmonologist had him stop O2 during the day and just wear it at night. He denies productive cough but has been having what he describes as "up and down days" where he feels extremely fatigued. He also recently had a cyst over his right nipple drained. He is on Clindamycin for this. He states his blood sugars have been under control. He is also c/o erectile dysfunction and would really like to start medications for this. He continues to have penile candidiasis. No other concerns.   Outpatient Encounter Prescriptions as of 02/27/2015  Medication Sig  . albuterol (PROVENTIL HFA;VENTOLIN HFA) 108 (90 BASE) MCG/ACT inhaler Inhale 2 puffs into the lungs every 4 (four) hours as needed for wheezing or shortness of breath (((PLAN B))).  [name] albuterol (PROVENTIL) (5 MG/ML) 0.5% nebulizer solution Take 0.5 mLs (2.5 mg total) by nebulization every 4 (four) hours as needed for wheezing or shortness of breath (((PLAN C))). (Patient taking differently: Take 2.5 mg by nebulization daily. )  . beclomethasone (QVAR) 80 MCG/ACT inhaler Inhale 2 puffs into the lungs 2 (two) times daily.  . Blood Glucose Monitoring Suppl (ACCU-CHEK AVIVA PLUS) W/DEVICE KIT 250 Test up to 4 times daily  . clindamycin (CLEOCIN) 300 MG capsule Take 1 capsule (300 mg total) by mouth 4 (four) times daily.  . dapsone 100 MG tablet TAKE 1 TABLET BY MOUTH DAILY  . Darunavir Ethanolate (PREZISTA) 800 MG tablet Take 1 tablet (800 mg total) by mouth daily with breakfast.  .  elvitegravir-cobicistat-emtricitabine-tenofovir (STRIBILD) 150-150-200-300 MG TABS tablet Take 1 tablet by mouth daily with breakfast.  . glucose blood test strip Use as instructed  . insulin glargine (LANTUS) 100 UNIT/ML injection Inject 0.1 mLs (10 Units total) into the skin at bedtime.  . Insulin Pen Needle 32G X 5 MM MISC Use as directed  . lisinopril (PRINIVIL) 10 MG tablet Take 1 tablet (10 mg total) by mouth daily.  . metFORMIN (GLUCOPHAGE) 500 MG tablet Take 1 tablet (500 mg total) by mouth 2 (two) times daily with a meal.  . nystatin ointment (MYCOSTATIN) Apply 1 application topically 2 (two) times daily. To affected area  . oxyCODONE-acetaminophen (PERCOCET/ROXICET) 5-325 MG per tablet Take 1-2 tablets by mouth every 6 (six) hours as needed for moderate pain.  . Tiotropium Bromide Monohydrate (SPIRIVA RESPIMAT) 2.5 MCG/ACT AERS Take 2 puffs by mouth every morning.  . triamcinolone cream (KENALOG) 0.1 % Apply 1 application topically 2 (two) times daily.   No facility-administered encounter medications on file as of 02/27/2015.     Review of Systems  Constitutional: Positive for fatigue. Negative for fever, chills, diaphoresis and unexpected weight change.  HENT: Negative for congestion, mouth sores, sinus pressure and trouble swallowing.   Eyes: Negative for photophobia and visual disturbance.  Respiratory: Positive for cough and shortness of breath. Negative for chest tightness, wheezing and stridor.   Cardiovascular: Negative for leg swelling.  Gastrointestinal: Negative for  nausea, abdominal pain, diarrhea, constipation and abdominal distention.  Genitourinary: Positive for discharge. Negative for penile swelling, scrotal swelling, enuresis, difficulty urinating, penile pain and testicular pain.       Small amount of white drainage from penis. Penile candidiasis. Reports Erectile dysfunction.  Musculoskeletal: Negative for joint swelling and arthralgias.  Skin: Positive for wound.        From cyst drainage over right nipple  Neurological: Negative for dizziness, syncope, weakness and headaches.  Hematological: Negative for adenopathy.  Psychiatric/Behavioral: Negative for suicidal ideas, sleep disturbance and self-injury. The patient is not nervous/anxious.        Objective:   Physical Exam  Constitutional: He is oriented to person, place, and time. He appears well-developed and well-nourished.  HENT:  Head: Normocephalic and atraumatic.  Mouth/Throat: No oropharyngeal exudate.  Eyes: Conjunctivae are normal. Pupils are equal, round, and reactive to light.  Neck: Normal range of motion. Neck supple.  Cardiovascular: Normal rate and regular rhythm.   Pulmonary/Chest: Effort normal.  On 2L [place] in office. Sats were 86% off O2.   Abdominal: Soft. Bowel sounds are normal.  Musculoskeletal: Normal range of motion.  Lymphadenopathy:    He has no cervical adenopathy.  Neurological: He is alert and oriented to person, place, and time.  Skin: Skin is warm and dry.  Small incision over right nipple. Small amount of serosanguinous drainage. Covered with 4X4 gauze.   Psychiatric: He has a normal mood and affect. His behavior is normal. Judgment and thought content normal.     Lab Results  Component Value Date   HIV1RNAQUANT <20 09/21/2014   Lab Results  Component Value Date   CD4TABS 190* 12/07/2014   CD4TABS 200* 09/21/2014   CD4TABS 180* 05/25/2014        Assessment & Plan:   HIV: Well controlled. He does have a CD4 count <200 and is on OI prophylaxis.  - Continue current regimen of Prezista and Stribild - Continue Dapsone for OI pphx - Hasn't had VL checked since Feb. Will recheck this today along with lipid panel, CBC and BMP - RTC in 3 months  COPD with low O2 sats: He is no longer wearing oxygen during the day. With his symptoms of fatigue and his dry cough I am concerned about his pulmonary status and that he will likely need to be put back on O2  during the day. - Recommend he call his pulmonologist about his low oxygen sats and his general feelings of fatigue since his oxygen orders had been changed  Erectile Dysfunction: He states that ED is very troublesome for him and he would like to start medications for this. He is on blood pressure medicine so we discussed Viagra in depth and when to take it.  - Start Viagra- limit to 1 to 2 times/week - Discussed that he needs to go to the ER for an erection that lasts for 3 hours or more.   Cyst drainage of right breast tissue: Healing well.  - Continue his current regimen of Clindamycin

## 2015-02-28 LAB — HIV-1 RNA QUANT-NO REFLEX-BLD
HIV 1 RNA Quant: <20 copies/mL (ref <20)
HIV-1 RNA Quant, Log: <1.3 {Log} (ref <1.30)

## 2015-03-01 ENCOUNTER — Telehealth: Payer: Self-pay | Admitting: *Deleted

## 2015-03-01 NOTE — Telephone Encounter (Signed)
PA for Viagra, not covered by Medicaid, will need PAP.  Cialis also not covered by Medicaid.  RN spoke with Rinaldo Cloud, medication assistance.  She will work with the pt for PAP.  Pt needs to call P. Jones to make arrangements.

## 2015-03-05 ENCOUNTER — Other Ambulatory Visit: Payer: Self-pay | Admitting: *Deleted

## 2015-03-05 DIAGNOSIS — N521 Erectile dysfunction due to diseases classified elsewhere: Secondary | ICD-10-CM

## 2015-03-05 MED ORDER — SILDENAFIL CITRATE 50 MG PO TABS: 25.0000 mg | ORAL_TABLET | Freq: Every day | ORAL | Status: DC | PRN

## 2015-03-05 NOTE — Progress Notes (Signed)
Patient ID: Chris [name], male   DOB: 1966/05/22, 49 y.o.   MRN: [id number]       Patient ID: Chris [name], male   DOB: [birth year], 49 y.o.   MRN: [id number]  HPI 49yo M with HIV disease, COPD, type 2 DM. CD 4 count of 190/VL<20 on stribild-DRV. Doing well with HIv regimen. He is here for follow up but noted to be dyspneic on arrival. He reported being discontinued from his oxygen by pulmonary. His O2 sat mid 80s which improved after placing him on supplemental oxygen. Since last visit, he had right breast cyst excised/I x D, which is healing.  ROS: shortness of breath with exertion. Still has ongoing erectile dysfunction and would like to discuss using viagra.   Outpatient Encounter Prescriptions as of 02/27/2015  Medication Sig  . albuterol (PROVENTIL HFA;VENTOLIN HFA) 108 (90 BASE) MCG/ACT inhaler Inhale 2 puffs into the lungs every 4 (four) hours as needed for wheezing or shortness of breath (((PLAN B))).  [name] albuterol (PROVENTIL) (5 MG/ML) 0.5% nebulizer solution Take 0.5 mLs (2.5 mg total) by nebulization every 4 (four) hours as needed for wheezing or shortness of breath (((PLAN C))). (Patient taking differently: Take 2.5 mg by nebulization daily. )  . beclomethasone (QVAR) 80 MCG/ACT inhaler Inhale 2 puffs into the lungs 2 (two) times daily.  . Blood Glucose Monitoring Suppl (ACCU-CHEK AVIVA PLUS) W/DEVICE KIT 250 Test up to 4 times daily  . clindamycin (CLEOCIN) 300 MG capsule Take 1 capsule (300 mg total) by mouth 4 (four) times daily.  . dapsone 100 MG tablet TAKE 1 TABLET BY MOUTH DAILY  . Darunavir Ethanolate (PREZISTA) 800 MG tablet Take 1 tablet (800 mg total) by mouth daily with breakfast.  . elvitegravir-cobicistat-emtricitabine-tenofovir (STRIBILD) 150-150-200-300 MG TABS tablet Take 1 tablet by mouth daily with breakfast.  . glucose blood test strip Use as instructed  . insulin glargine (LANTUS) 100 UNIT/ML injection Inject 0.1 mLs (10 Units total) into the skin at bedtime.  . Insulin  Pen Needle 32G X 5 MM MISC Use as directed  . lisinopril (PRINIVIL) 10 MG tablet Take 1 tablet (10 mg total) by mouth daily.  . metFORMIN (GLUCOPHAGE) 500 MG tablet Take 1 tablet (500 mg total) by mouth 2 (two) times daily with a meal.  . nystatin ointment (MYCOSTATIN) Apply 1 application topically 2 (two) times daily. To affected area  . oxyCODONE-acetaminophen (PERCOCET/ROXICET) 5-325 MG per tablet Take 1-2 tablets by mouth every 6 (six) hours as needed for moderate pain.  . Tiotropium Bromide Monohydrate (SPIRIVA RESPIMAT) 2.5 MCG/ACT AERS Take 2 puffs by mouth every morning.  . triamcinolone cream (KENALOG) 0.1 % Apply 1 application topically 2 (two) times daily.  . sildenafil (VIAGRA) 50 MG tablet Take 0.5 tablets (25 mg total) by mouth daily as needed for erectile dysfunction.   No facility-administered encounter medications on file as of 02/27/2015.     Patient Active Problem List   Diagnosis Date Noted  . Abscess of right breast 02/20/2015  . Genital warts 01/22/2015  . Other male erectile dysfunction 01/22/2015  . Type 2 diabetes mellitus with complication 82/64/1583  . Pneumonia 12/14/2014  . HCAP (healthcare-associated pneumonia) 12/07/2014  . Essential hypertension, benign 12/04/2014  . Atopic eczema 12/04/2014  . Dyspnea 12/12/2013  . Acute on chronic respiratory failure 12/12/2013  . COPD exacerbation 08/23/2013  . Cyst (solitary) of breast 08/23/2013  . Breast abscess 08/23/2013  . OSA (obstructive sleep apnea) 08/23/2013  . Smoker 02/17/2013  . Hyperglycemia 02/17/2013  .  Uncontrolled diabetes mellitus 02/17/2013  . DM (diabetes mellitus) 01/27/2013  . Steroid-induced hyperglycemia 01/12/2013  . Chronic respiratory failure 01/12/2013  . COPD  GOLD III, still smoking 01/11/2013  . Nicotine abuse 01/11/2013  . HIV disease 12/06/2012     Health Maintenance Due  Topic Date Due  . FOOT EXAM  04/12/1976  . OPHTHALMOLOGY EXAM  04/12/1976  . TETANUS/TDAP  04/12/1985      Review of Systems  Physical Exam   BP 121/77 mmHg  Pulse 90  Temp(Src) 97.5 F (36.4 C) (Oral)  Wt 273 lb (123.832 kg)  SpO2 86% Physical Exam  Constitutional: He is oriented to person, place, and time. He appears well-developed and well-nourished. No distress.  HENT:  Mouth/Throat: Oropharynx is clear and moist. No oropharyngeal exudate.  Cardiovascular: Normal rate, regular rhythm and normal heart sounds. Exam reveals no gallop and no friction rub.  No murmur heard.  Pulmonary/Chest: Effort normal and breath sounds normal. No respiratory distress. He has no wheezes.  Lymphadenopathy:  He has no cervical adenopathy.  Psychiatric: He has a normal mood and affect. His behavior is normal.    Lab Results  Component Value Date   CD4TCELL 9* 12/07/2014   Lab Results  Component Value Date   CD4TABS 190* 12/07/2014   CD4TABS 200* 09/21/2014   CD4TABS 180* 05/25/2014   Lab Results  Component Value Date   HIV1RNAQUANT <20 02/27/2015   Lab Results  Component Value Date   HEPBSAB REACTIVE* 12/06/2012   No results found for: RPR  CBC Lab Results  Component Value Date   WBC 6.6 02/27/2015   RBC 4.97 02/27/2015   HGB 14.7 02/27/2015   HCT 44.2 02/27/2015   PLT 207 02/27/2015   MCV 88.9 02/27/2015   MCH 29.6 02/27/2015   MCHC 33.3 02/27/2015   RDW 13.4 02/27/2015   LYMPHSABS 2.4 02/27/2015   MONOABS 0.8 02/27/2015   EOSABS 0.1 02/27/2015   BASOSABS 0.1 02/27/2015   BMET Lab Results  Component Value Date   NA 137 02/27/2015   K 4.3 02/27/2015   CL 100 02/27/2015   CO2 29 02/27/2015   GLUCOSE 109* 02/27/2015   BUN 12 02/27/2015   CREATININE 0.85 02/27/2015   CALCIUM 9.3 02/27/2015   GFRNONAA >89 02/27/2015   GFRAA >89 02/27/2015     Assessment and Plan  hiv disease =well controlled, continue current regimen but does need to continue with dapsone for oi proph  Erectile dysfunction = will give low dose viagra, and note to take 1/2 tab due to drug  interaction with hiv regimen.   Hypoxia associated with COPD = it appears that he may need supp o2 with ambulation, recommend to restart and discuss with pulmonology

## 2015-03-06 ENCOUNTER — Telehealth: Payer: Self-pay | Admitting: Internal Medicine

## 2015-03-06 ENCOUNTER — Other Ambulatory Visit: Payer: Self-pay

## 2015-03-06 MED ORDER — ACETAMINOPHEN-CODEINE #3 300-30 MG PO TABS: 1.0000 | ORAL_TABLET | Freq: Four times a day (QID) | ORAL | Status: DC | PRN

## 2015-03-06 NOTE — Telephone Encounter (Signed)
Patient came into office requesting medication refill for Tylenol #3, patient states he is completely out of medication and is in pain. Please f/u

## 2015-03-06 NOTE — Progress Notes (Unsigned)
Patient showed up in the office requesting a refill on his tylenol #3 Per Dr Hyman Hopes we will refill it

## 2015-03-26 ENCOUNTER — Ambulatory Visit: Payer: Medicaid Other | Attending: Internal Medicine | Admitting: Internal Medicine

## 2015-03-26 ENCOUNTER — Encounter: Payer: Self-pay | Admitting: Internal Medicine

## 2015-03-26 VITALS — BP 120/79 | HR 95 | Temp 98.3°F | Resp 18 | Ht 69.0 in | Wt 275.6 lb

## 2015-03-26 DIAGNOSIS — I10 Essential (primary) hypertension: Secondary | ICD-10-CM

## 2015-03-26 DIAGNOSIS — M25569 Pain in unspecified knee: Secondary | ICD-10-CM | POA: Diagnosis present

## 2015-03-26 DIAGNOSIS — Z9981 Dependence on supplemental oxygen: Secondary | ICD-10-CM | POA: Diagnosis not present

## 2015-03-26 DIAGNOSIS — J449 Chronic obstructive pulmonary disease, unspecified: Secondary | ICD-10-CM | POA: Insufficient documentation

## 2015-03-26 DIAGNOSIS — M1711 Unilateral primary osteoarthritis, right knee: Secondary | ICD-10-CM | POA: Insufficient documentation

## 2015-03-26 DIAGNOSIS — E119 Type 2 diabetes mellitus without complications: Secondary | ICD-10-CM | POA: Diagnosis not present

## 2015-03-26 DIAGNOSIS — F17210 Nicotine dependence, cigarettes, uncomplicated: Secondary | ICD-10-CM | POA: Diagnosis not present

## 2015-03-26 DIAGNOSIS — F1910 Other psychoactive substance abuse, uncomplicated: Secondary | ICD-10-CM

## 2015-03-26 DIAGNOSIS — Z79899 Other long term (current) drug therapy: Secondary | ICD-10-CM | POA: Insufficient documentation

## 2015-03-26 DIAGNOSIS — Z794 Long term (current) use of insulin: Secondary | ICD-10-CM | POA: Insufficient documentation

## 2015-03-26 DIAGNOSIS — B20 Human immunodeficiency virus [HIV] disease: Secondary | ICD-10-CM | POA: Diagnosis not present

## 2015-03-26 DIAGNOSIS — Z720 Tobacco use: Secondary | ICD-10-CM

## 2015-03-26 LAB — POCT GLYCOSYLATED HEMOGLOBIN (HGB A1C): Hemoglobin A1C: 5.6

## 2015-03-26 LAB — GLUCOSE, POCT (MANUAL RESULT ENTRY): POC Glucose: 174 mg/dl — AB (ref 70–99)

## 2015-03-26 MED ORDER — METFORMIN HCL 500 MG PO TABS: 500.0000 mg | ORAL_TABLET | Freq: Two times a day (BID) | ORAL | Status: DC

## 2015-03-26 MED ORDER — ACETAMINOPHEN-CODEINE #3 300-30 MG PO TABS: 1.0000 | ORAL_TABLET | Freq: Four times a day (QID) | ORAL | Status: DC | PRN

## 2015-03-26 MED ORDER — LISINOPRIL 10 MG PO TABS: 10.0000 mg | ORAL_TABLET | Freq: Every day | ORAL | Status: DC

## 2015-03-26 MED ORDER — BECLOMETHASONE DIPROPIONATE 80 MCG/ACT IN AERS: 2.0000 | INHALATION_SPRAY | Freq: Two times a day (BID) | RESPIRATORY_TRACT | Status: DC

## 2015-03-26 NOTE — Progress Notes (Signed)
Patient here for lower back pain, right knee pain and SOB. Patient reports pain today in lower back rated at a 8 described as aching. Patient reports it hurts when he bends. Pain has been present for 4 weeks now and is constant. Patient reports pain today in his right knee which he reports he had surgery on and rods placed in it a while ago. Pain rated at a 7 described as aching. Pain has been present for years and is constant. Patient reports he was prescribed tylenol #3 for pain but that no longer helps with the knee pain and lower back pain. Patient reports he was taken off of his oxygen except for at bedtime and his O2 sats started decreasing. Patient met with his infectious disease doctor and they suggested he return to using his oxygen throughout the day as well. Patient reports he has not been experiencing SOB as frequently since he has been back to using oxygen throughout the day and night which has been about a month now. Patient reports he still gets tired and SOB sometimes but not as often as he was.   Patient has only taken qvar and spiriva this morning. Patient needs refills on qvar, spiriva, lisinopril, and metformin. Patient has been out of BP medication for a few days now. Patient reports the nystatin ointment he was prescribed is not helping with the whiteness around the head of his penis.   Patient CBG is 174 fasting and A1C is 5.6.

## 2015-03-26 NOTE — Patient Instructions (Signed)
Diabetes and Exercise Exercising regularly is important. It is not just about losing weight. It has many health benefits, such as:  Improving your overall fitness, flexibility, and endurance.  Increasing your bone density.  Helping with weight control.  Decreasing your body fat.  Increasing your muscle strength.  Reducing stress and tension.  Improving your overall health. People with diabetes who exercise gain additional benefits because exercise:  Reduces appetite.  Improves the body's use of blood sugar (glucose).  Helps lower or control blood glucose.  Decreases blood pressure.  Helps control blood lipids (such as cholesterol and triglycerides).  Improves the body's use of the hormone insulin by:  Increasing the body's insulin sensitivity.  Reducing the body's insulin needs.  Decreases the risk for heart disease because exercising:  Lowers cholesterol and triglycerides levels.  Increases the levels of good cholesterol (such as high-density lipoproteins [HDL]) in the body.  Lowers blood glucose levels. YOUR ACTIVITY PLAN  Choose an activity that you enjoy and set realistic goals. Your health care provider or diabetes educator can help you make an activity plan that works for you. Exercise regularly as directed by your health care provider. This includes:  Performing resistance training twice a week such as push-ups, sit-ups, lifting weights, or using resistance bands.  Performing 150 minutes of cardio exercises each week such as walking, running, or playing sports.  Staying active and spending no more than 90 minutes at one time being inactive. Even short bursts of exercise are good for you. Three 10-minute sessions spread throughout the day are just as beneficial as a single 30-minute session. Some exercise ideas include:  Taking the dog for a walk.  Taking the stairs instead of the elevator.  Dancing to your favorite song.  Doing an exercise  video.  Doing your favorite exercise with a friend. RECOMMENDATIONS FOR EXERCISING WITH TYPE 1 OR TYPE 2 DIABETES   Check your blood glucose before exercising. If blood glucose levels are greater than 240 mg/dL, check for urine ketones. Do not exercise if ketones are present.  Avoid injecting insulin into areas of the body that are going to be exercised. For example, avoid injecting insulin into:  The arms when playing tennis.  The legs when jogging.  Keep a record of:  Food intake before and after you exercise.  Expected peak times of insulin action.  Blood glucose levels before and after you exercise.  The type and amount of exercise you have done.  Review your records with your health care provider. Your health care provider will help you to develop guidelines for adjusting food intake and insulin amounts before and after exercising.  If you take insulin or oral hypoglycemic agents, watch for signs and symptoms of hypoglycemia. They include:  Dizziness.  Shaking.  Sweating.  Chills.  Confusion.  Drink plenty of water while you exercise to prevent dehydration or heat stroke. Body water is lost during exercise and must be replaced.  Talk to your health care provider before starting an exercise program to make sure it is safe for you. Remember, almost any type of activity is better than none. Document Released: 10/18/2003 Document Revised: 12/12/2013 Document Reviewed: 01/04/2013 Exit[employer] Patient Information 2015 [employer]. This information is not intended to replace advice given to you by your health care provider. Make sure you discuss any questions you have with your health care provider. Basic Carbohydrate Counting for Diabetes Mellitus Carbohydrate counting is a method for keeping track of the amount of carbohydrates you eat.   Eating carbohydrates naturally increases the level of sugar (glucose) in your blood, so it is important for you to know the amount that is  okay for you to have in every meal. Carbohydrate counting helps keep the level of glucose in your blood within normal limits. The amount of carbohydrates allowed is different for every person. A dietitian can help you calculate the amount that is right for you. Once you know the amount of carbohydrates you can have, you can count the carbohydrates in the foods you want to eat. Carbohydrates are found in the following foods:  Grains, such as breads and cereals.  Dried beans and soy products.  Starchy vegetables, such as potatoes, peas, and corn.  Fruit and fruit juices.  Milk and yogurt.  Sweets and snack foods, such as cake, cookies, candy, chips, soft drinks, and fruit drinks. CARBOHYDRATE COUNTING There are two ways to count the carbohydrates in your food. You can use either of the methods or a combination of both. Reading the "Nutrition Facts" on Packaged Food The "Nutrition Facts" is an area that is included on the labels of almost all packaged food and beverages in the [place]. It includes the serving size of that food or beverage and information about the nutrients in each serving of the food, including the grams (g) of carbohydrate per serving.  Decide the number of servings of this food or beverage that you will be able to eat or drink. Multiply that number of servings by the number of grams of carbohydrate that is listed on the label for that serving. The total will be the amount of carbohydrates you will be having when you eat or drink this food or beverage. Learning Standard Serving Sizes of Food When you eat food that is not packaged or does not include "Nutrition Facts" on the label, you need to measure the servings in order to count the amount of carbohydrates.A serving of most carbohydrate-rich foods contains about 15 g of carbohydrates. The following list includes serving sizes of carbohydrate-rich foods that provide 15 g ofcarbohydrate per serving:   1 slice of bread  (1 oz) or 1 six-inch tortilla.    of a hamburger bun or English muffin.  4-6 crackers.   cup unsweetened dry cereal.    cup hot cereal.   cup rice or pasta.    cup mashed potatoes or  of a large baked potato.  1 cup fresh fruit or one small piece of fruit.    cup canned or frozen fruit or fruit juice.  1 cup milk.   cup plain fat-free yogurt or yogurt sweetened with artificial sweeteners.   cup cooked dried beans or starchy vegetable, such as peas, corn, or potatoes.  Decide the number of standard-size servings that you will eat. Multiply that number of servings by 15 (the grams of carbohydrates in that serving). For example, if you eat 2 cups of strawberries, you will have eaten 2 servings and 30 g of carbohydrates (2 servings x 15 g = 30 g). For foods such as soups and casseroles, in which more than one food is mixed in, you will need to count the carbohydrates in each food that is included. EXAMPLE OF CARBOHYDRATE COUNTING Sample Dinner  3 oz chicken breast.   cup of brown rice.   cup of corn.  1 cup milk.   1 cup strawberries with sugar-free whipped topping.  Carbohydrate Calculation Step 1: Identify the foods that contain carbohydrates:   Rice.   Corn.     Milk.   Strawberries. Step 2:Calculate the number of servings eaten of each:   2 servings of rice.   1 serving of corn.   1 serving of milk.   1 serving of strawberries. Step 3: Multiply each of those number of servings by 15 g:   2 servings of rice x 15 g = 30 g.   1 serving of corn x 15 g = 15 g.   1 serving of milk x 15 g = 15 g.   1 serving of strawberries x 15 g = 15 g. Step 4: Add together all of the amounts to find the total grams of carbohydrates eaten: 30 g + 15 g + 15 g + 15 g = 75 g. Document Released: 07/28/2005 Document Revised: 12/12/2013 Document Reviewed: 06/24/2013 Exit[employer] Patient Information 2015 [employer]. This information is not intended to  replace advice given to you by your health care provider. Make sure you discuss any questions you have with your health care provider.  

## 2015-03-26 NOTE — Progress Notes (Signed)
Patient ID: Chris [name], male   DOB: 03-Oct-1965, 49 y.o.   MRN: [id number]   Chris [name], is a 50 y.o. male  TXM:[id number]  [id number]:[id number]  DOB - Jan 01, 1966  Chief Complaint  Patient presents with  . Back Pain  . Knee Pain  . Shortness of Breath        Subjective:   Chris [name] a 49 y.o. male here today for a follow up visit. Patient here for lower back pain, right knee pain and SOB. Patient reports pain today in lower back rated at a 8 described as aching. Patient reports it hurts when he bends. Pain has been present for 4 weeks now and is constant. Patient reports pain today in his right knee which he reports he had surgery on and rods placed in it a while ago. Pain rated at a 7 described as aching. Pain has been present for years and is constant. Patient reports he was prescribed tylenol #3 for pain but that no longer helps with the knee pain and lower back pain. Patient reports he was taken off of his oxygen except for at bedtime and his O2 sats started decreasing. Patient met with his infectious disease doctor and they suggested he return to using his oxygen throughout the day as well. Patient reports he has not been experiencing SOB as frequently since he has been back to using oxygen throughout the day and night which has been about a month now. Patient reports he still gets tired and SOB sometimes but not as often as he was. His other medical conditions include HIV well controlled with the last viral load less than 20 copies, COPD on home oxygen, diabetes mellitus type 2, morbid obesity, hypertension and current every day smoker. Patient has used qvar and spiriva this morning. Patient needs refills on qvar, spiriva, lisinopril, and metformin. Patient has been out of BP medication for a few days now. Patient has No headache, No chest pain, No abdominal pain - No Nausea, No new weakness tingling or numbness.  Problem  Primary Osteoarthritis of Right Knee    ALLERGIES: Allergies    Allergen Reactions  . Bactrim [Sulfamethoxazole-Trimethoprim] Hives    PAST MEDICAL HISTORY: Past Medical History  Diagnosis Date  . COPD (chronic obstructive pulmonary disease)   . HIV disease   . Emphysema   . Diabetes mellitus without complication     MEDICATIONS AT HOME: Prior to Admission medications   Medication Sig Start Date End Date Taking? Authorizing Provider  acetaminophen-codeine (TYLENOL #3) 300-30 MG per tablet Take 1 tablet by mouth every 6 (six) hours as needed for moderate pain. 03/26/15  Yes Tresa Garter, MD  albuterol (PROVENTIL HFA;VENTOLIN HFA) 108 (90 BASE) MCG/ACT inhaler Inhale 2 puffs into the lungs every 4 (four) hours as needed for wheezing or shortness of breath (((PLAN B))). 12/04/14  Yes Idriss Quackenbush E Doreene Burke, MD  albuterol (PROVENTIL) (5 MG/ML) 0.5% nebulizer solution Take 0.5 mLs (2.5 mg total) by nebulization every 4 (four) hours as needed for wheezing or shortness of breath (((PLAN C))). Patient taking differently: Take 2.5 mg by nebulization daily.  04/28/14  Yes Tresa Garter, MD  beclomethasone (QVAR) 80 MCG/ACT inhaler Inhale 2 puffs into the lungs 2 (two) times daily. 03/26/15  Yes Tresa Garter, MD  Blood Glucose Monitoring Suppl (ACCU-CHEK AVIVA PLUS) W/DEVICE KIT 250 Test up to 4 times daily 04/28/14  Yes Scarlett Portlock E Doreene Burke, MD  dapsone 100 MG tablet TAKE 1 TABLET BY MOUTH DAILY 02/19/15  Yes Carlyle Basques, MD  Darunavir Ethanolate (PREZISTA) 800 MG tablet Take 1 tablet (800 mg total) by mouth daily with breakfast. 06/20/14  Yes Carlyle Basques, MD  elvitegravir-cobicistat-emtricitabine-tenofovir (STRIBILD) 150-150-200-300 MG TABS tablet Take 1 tablet by mouth daily with breakfast. 06/20/14  Yes Carlyle Basques, MD  glucose blood test strip Use as instructed 04/20/14  Yes Jozalynn Noyce E Doreene Burke, MD  insulin glargine (LANTUS) 100 UNIT/ML injection Inject 0.1 mLs (10 Units total) into the skin at bedtime. 09/28/14  Yes Tresa Garter,  MD  lisinopril (PRINIVIL) 10 MG tablet Take 1 tablet (10 mg total) by mouth daily. 03/26/15  Yes Tresa Garter, MD  metFORMIN (GLUCOPHAGE) 500 MG tablet Take 1 tablet (500 mg total) by mouth 2 (two) times daily with a meal. 03/26/15  Yes Tresa Garter, MD  nystatin ointment (MYCOSTATIN) Apply 1 application topically 2 (two) times daily. To affected area 02/05/15  Yes Carlyle Basques, MD  sildenafil (VIAGRA) 50 MG tablet Take 0.5 tablets (25 mg total) by mouth daily as needed for erectile dysfunction. 03/05/15  Yes Carlyle Basques, MD  Tiotropium Bromide Monohydrate (SPIRIVA RESPIMAT) 2.5 MCG/ACT AERS Take 2 puffs by mouth every morning. 01/22/15  Yes Tresa Garter, MD  clindamycin (CLEOCIN) 300 MG capsule Take 1 capsule (300 mg total) by mouth 4 (four) times daily. Patient not taking: Reported on 03/26/2015 02/21/15   Nat Christen, PA-C  Insulin Pen Needle 32G X 5 MM MISC Use as directed Patient not taking: Reported on 03/26/2015 06/12/14   Tresa Garter, MD  oxyCODONE-acetaminophen (PERCOCET/ROXICET) 5-325 MG per tablet Take 1-2 tablets by mouth every 6 (six) hours as needed for moderate pain. Patient not taking: Reported on 03/26/2015 02/21/15   Nat Christen, PA-C  triamcinolone cream (KENALOG) 0.1 % Apply 1 application topically 2 (two) times daily. Patient not taking: Reported on 03/26/2015 01/22/15   Tresa Garter, MD     Objective:   Filed Vitals:   03/26/15 0953  BP: 120/79  Pulse: 95  Temp: 98.3 F (36.8 C)  TempSrc: Oral  Resp: 18  Height: $Remove'5\' 9"'TuPANtF$  (1.753 m)  Weight: 275 lb 9.6 oz (125.011 kg)  SpO2: 91%    Exam General appearance : Awake, alert, not in any distress. Speech Clear. Not toxic looking, obese, oxygen by [place] in situ HEENT: Atraumatic and Normocephalic, pupils equally reactive to light and accomodation Neck: supple, no JVD. No cervical lymphadenopathy.  Chest:Good air entry bilaterally, no added sounds  CVS: S1 S2 regular, no murmurs.  Abdomen:  Bowel sounds present, Non tender and not distended with no gaurding, rigidity or rebound. Extremities: B/L Lower Ext shows no edema, both legs are warm to touch Neurology: Awake alert, and oriented X 3, CN II-XII intact, Non focal Skin:No Rash  Data Review Lab Results  Component Value Date   HGBA1C 5.60 03/26/2015   HGBA1C 6.0* 12/07/2014   HGBA1C 8.0 09/28/2014     Assessment & Plan   1. Type 2 diabetes mellitus without complications  - Glucose (CBG) - HgB A1c - metFORMIN (GLUCOPHAGE) 500 MG tablet; Take 1 tablet (500 mg total) by mouth 2 (two) times daily with a meal.  Dispense: 180 tablet; Refill: 3  Aim for 30 minutes of exercise most days. Rethink what you drink. Water is great! Aim for 2-3 Carb Choices per meal (30-45 grams) +/- 1 either way  Aim for 0-15 Carbs per snack if hungry  Include protein in moderation with your meals and snacks  Consider  reading food labels for Total Carbohydrate and Fat Grams of foods  Consider checking BG at alternate times per day  Continue taking medication as directed Be mindful about how much sugar you are adding to beverages and other foods. Try to decrease. Consider splenda. Fruit Punch - find one with no sugar  Measure and decrease portions of carbohydrate foods  Make your plate and don't go back for seconds   2. COPD mixed type  - beclomethasone (QVAR) 80 MCG/ACT inhaler; Inhale 2 puffs into the lungs 2 (two) times daily.  Dispense: 3 Inhaler; Refill: 3  3. Essential hypertension, benign  - lisinopril (PRINIVIL) 10 MG tablet; Take 1 tablet (10 mg total) by mouth daily.  Dispense: 90 tablet; Refill: 3 - We have discussed target BP range and blood pressure goal - I have advised patient to check BP regularly and to call us back or report to clinic if the numbers are consistently higher than 140/90  - We discussed the importance of compliance with medical therapy and DASH diet recommended, consequences of uncontrolled hypertension  discussed.  - continue current BP medications  4. Nicotine abuse  The patient was counseled on the dangers of tobacco use, and was advised to quit. Reviewed strategies to maximize success, including removing cigarettes and smoking materials from environment, stress management and support of family/friends.   5. Primary osteoarthritis of right knee  - acetaminophen-codeine (TYLENOL #3) 300-30 MG per tablet; Take 1 tablet by mouth every 6 (six) hours as needed for moderate pain.  Dispense: 90 tablet; Refill: 0  Patient have been counseled extensively about nutrition and exercise Return in about 3 months (around 06/26/2015) for Hemoglobin A1C and Follow up, DM, Follow up HTN.  The patient was given clear instructions to go to ER or return to medical center if symptoms don't improve, worsen or new problems develop. The patient verbalized understanding. The patient was told to call to get lab results if they haven't heard anything in the next week.   This note has been created with Surveyor, quantity. Any transcriptional errors are unintentional.    Angelica Chessman, MD, [place], [place], [address], [place] and [place], [place]   03/26/2015, 10:20 AM

## 2015-04-05 ENCOUNTER — Telehealth: Payer: Self-pay | Admitting: Internal Medicine

## 2015-04-05 NOTE — Telephone Encounter (Signed)
Patient called requesting a medication refill for, Tiotropium Bromide Monohydrate (SPIRIVA RESPIMAT). Please follow up.

## 2015-04-09 ENCOUNTER — Other Ambulatory Visit: Payer: Self-pay

## 2015-04-09 DIAGNOSIS — J441 Chronic obstructive pulmonary disease with (acute) exacerbation: Secondary | ICD-10-CM

## 2015-04-09 DIAGNOSIS — J9621 Acute and chronic respiratory failure with hypoxia: Secondary | ICD-10-CM

## 2015-04-09 DIAGNOSIS — J9612 Chronic respiratory failure with hypercapnia: Secondary | ICD-10-CM

## 2015-04-09 DIAGNOSIS — J449 Chronic obstructive pulmonary disease, unspecified: Secondary | ICD-10-CM

## 2015-04-09 MED ORDER — TIOTROPIUM BROMIDE MONOHYDRATE 18 MCG IN CAPS: 18.0000 ug | ORAL_CAPSULE | Freq: Every morning | RESPIRATORY_TRACT | Status: DC

## 2015-04-09 NOTE — Telephone Encounter (Signed)
Patient called stating that he needs Pre-Auth for Spiriva through his insurance, please f/u with pt.

## 2015-04-09 NOTE — Telephone Encounter (Signed)
Nurse spoke with Toni Amend in pharmacy. Spiriva HandiHaler 18 mcg is equivalent to Spiriva respimat 2.51mcg. Per provider, Vikki Ports, nurse will send prescription for Spiriva HandiHaler to [facility] for patient to pick up.

## 2015-04-09 NOTE — Telephone Encounter (Signed)
Spiriva needs prior authorization. Nurse will send message to provider for preferred drug of choice.

## 2015-04-09 NOTE — Telephone Encounter (Signed)
Nurse called patient, patient verified date of birth. Patient aware of prescription being changed to preferred medication and has been sent to pharmacy.

## 2015-05-08 ENCOUNTER — Encounter (HOSPITAL_BASED_OUTPATIENT_CLINIC_OR_DEPARTMENT_OTHER): Payer: Medicaid Other

## 2015-05-29 ENCOUNTER — Ambulatory Visit: Payer: Medicaid Other | Admitting: Internal Medicine

## 2015-05-29 ENCOUNTER — Telehealth: Payer: Self-pay | Admitting: *Deleted

## 2015-05-29 NOTE — Telephone Encounter (Signed)
Made a new appt. 

## 2015-06-18 ENCOUNTER — Ambulatory Visit (INDEPENDENT_AMBULATORY_CARE_PROVIDER_SITE_OTHER): Payer: Medicaid Other | Admitting: Internal Medicine

## 2015-06-18 ENCOUNTER — Encounter: Payer: Self-pay | Admitting: Internal Medicine

## 2015-06-18 VITALS — BP 120/75 | HR 102 | Temp 98.8°F | Ht 69.0 in | Wt 276.0 lb

## 2015-06-18 DIAGNOSIS — J441 Chronic obstructive pulmonary disease with (acute) exacerbation: Secondary | ICD-10-CM | POA: Diagnosis not present

## 2015-06-18 DIAGNOSIS — Z23 Encounter for immunization: Secondary | ICD-10-CM

## 2015-06-18 DIAGNOSIS — B20 Human immunodeficiency virus [HIV] disease: Secondary | ICD-10-CM | POA: Diagnosis not present

## 2015-06-18 DIAGNOSIS — M1711 Unilateral primary osteoarthritis, right knee: Secondary | ICD-10-CM

## 2015-06-18 DIAGNOSIS — Z794 Long term (current) use of insulin: Secondary | ICD-10-CM | POA: Diagnosis not present

## 2015-06-18 DIAGNOSIS — Z21 Asymptomatic human immunodeficiency virus [HIV] infection status: Secondary | ICD-10-CM | POA: Diagnosis present

## 2015-06-18 DIAGNOSIS — N521 Erectile dysfunction due to diseases classified elsewhere: Secondary | ICD-10-CM | POA: Diagnosis not present

## 2015-06-18 DIAGNOSIS — R6882 Decreased libido: Secondary | ICD-10-CM | POA: Diagnosis not present

## 2015-06-18 DIAGNOSIS — E119 Type 2 diabetes mellitus without complications: Secondary | ICD-10-CM | POA: Diagnosis not present

## 2015-06-18 LAB — COMPLETE METABOLIC PANEL WITH GFR
ALT: 26 U/L (ref 9–46)
AST: 23 U/L (ref 10–40)
Albumin: 4 g/dL (ref 3.6–5.1)
Alkaline Phosphatase: 48 U/L (ref 40–115)
BUN: 9 mg/dL (ref 7–25)
CHLORIDE: 98 mmol/L (ref 98–110)
CO2: 28 mmol/L (ref 20–31)
CREATININE: 0.84 mg/dL (ref 0.60–1.35)
Calcium: 8.4 mg/dL — ABNORMAL LOW (ref 8.6–10.3)
GFR, Est Non African American: >89 mL/min (ref >=60)
GLUCOSE: 115 mg/dL — AB (ref 65–99)
Potassium: 4.6 mmol/L (ref 3.5–5.3)
SODIUM: 135 mmol/L (ref 135–146)
Total Bilirubin: 0.4 mg/dL (ref 0.2–1.2)
Total Protein: 8.2 g/dL — ABNORMAL HIGH (ref 6.1–8.1)

## 2015-06-18 LAB — CBC WITH DIFFERENTIAL/PLATELET
BASOS PCT: 0 % (ref 0–1)
Basophils Absolute: 0 10*3/uL (ref 0.0–0.1)
EOS ABS: 0.3 10*3/uL (ref 0.0–0.7)
Eosinophils Relative: 4 % (ref 0–5)
HCT: 44 % (ref 39.0–52.0)
Hemoglobin: 14.3 g/dL (ref 13.0–17.0)
LYMPHS ABS: 2.9 10*3/uL (ref 0.7–4.0)
Lymphocytes Relative: 36 % (ref 12–46)
MCH: 29.2 pg (ref 26.0–34.0)
MCHC: 32.5 g/dL (ref 30.0–36.0)
MCV: 90 fL (ref 78.0–100.0)
MONOS PCT: 11 % (ref 3–12)
MPV: 10.3 fL (ref 8.6–12.4)
Monocytes Absolute: 0.9 10*3/uL (ref 0.1–1.0)
NEUTROS PCT: 49 % (ref 43–77)
Neutro Abs: 4 10*3/uL (ref 1.7–7.7)
PLATELETS: 206 10*3/uL (ref 150–400)
RBC: 4.89 MIL/uL (ref 4.22–5.81)
RDW: 13.4 % (ref 11.5–15.5)
WBC: 8.1 10*3/uL (ref 4.0–10.5)

## 2015-06-18 MED ORDER — SILDENAFIL CITRATE 50 MG PO TABS: 25.0000 mg | ORAL_TABLET | Freq: Every day | ORAL | Status: DC | PRN

## 2015-06-18 MED ORDER — ACETAMINOPHEN-CODEINE #3 300-30 MG PO TABS: 1.0000 | ORAL_TABLET | Freq: Three times a day (TID) | ORAL | Status: DC | PRN

## 2015-06-18 NOTE — Progress Notes (Signed)
Patient ID: Chris [name], male   DOB: Dec 15, 1965, 49 y.o.   MRN: [id number]       Patient ID: Chris [name], male   DOB: 03-04-1966, 49 y.o.   MRN: [id number]  HPI Chris [name] isa 79KW M with HIV disease, CD 4 count of 190/VL<20 on Stribild-DRV. He has not missed any doses. He is also on Dapsone for OI prophylaxis. He wears [facility] daily. He is taking his meds as directed. He notices that he has some low back pain that is chronic, difficult for him to move arround. In the past he was given tylenol #3 which worked for him, prescribed by Dr. Doreene Burke, but has not seen him/community [facility] clinic is "full"  Outpatient Encounter Prescriptions as of 06/18/2015  Medication Sig  . albuterol (PROVENTIL HFA;VENTOLIN HFA) 108 (90 BASE) MCG/ACT inhaler Inhale 2 puffs into the lungs every 4 (four) hours as needed for wheezing or shortness of breath (((PLAN B))).  [name] albuterol (PROVENTIL) (5 MG/ML) 0.5% nebulizer solution Take 0.5 mLs (2.5 mg total) by nebulization every 4 (four) hours as needed for wheezing or shortness of breath (((PLAN C))). (Patient taking differently: Take 2.5 mg by nebulization daily. )  . beclomethasone (QVAR) 80 MCG/ACT inhaler Inhale 2 puffs into the lungs 2 (two) times daily.  . Blood Glucose Monitoring Suppl (ACCU-CHEK AVIVA PLUS) W/DEVICE KIT 250 Test up to 4 times daily  . dapsone 100 MG tablet TAKE 1 TABLET BY MOUTH DAILY  . Darunavir Ethanolate (PREZISTA) 800 MG tablet Take 1 tablet (800 mg total) by mouth daily with breakfast.  . elvitegravir-cobicistat-emtricitabine-tenofovir (STRIBILD) 150-150-200-300 MG TABS tablet Take 1 tablet by mouth daily with breakfast.  . glucose blood test strip Use as instructed  . insulin glargine (LANTUS) 100 UNIT/ML injection Inject 0.1 mLs (10 Units total) into the skin at bedtime.  . Insulin Pen Needle 32G X 5 MM MISC Use as directed  . lisinopril (PRINIVIL) 10 MG tablet Take 1 tablet (10 mg total) by mouth daily.  . metFORMIN (GLUCOPHAGE) 500 MG tablet Take  1 tablet (500 mg total) by mouth 2 (two) times daily with a meal.  . nystatin ointment (MYCOSTATIN) Apply 1 application topically 2 (two) times daily. To affected area  . sildenafil (VIAGRA) 50 MG tablet Take 0.5 tablets (25 mg total) by mouth daily as needed for erectile dysfunction.  [name] tiotropium (SPIRIVA HANDIHALER) 18 MCG inhalation capsule Place 1 capsule (18 mcg total) into inhaler and inhale every morning.  . triamcinolone cream (KENALOG) 0.1 % Apply 1 application topically 2 (two) times daily.  [name] acetaminophen-codeine (TYLENOL #3) 300-30 MG per tablet Take 1 tablet by mouth every 6 (six) hours as needed for moderate pain. (Patient not taking: Reported on 06/18/2015)  . clindamycin (CLEOCIN) 300 MG capsule Take 1 capsule (300 mg total) by mouth 4 (four) times daily. (Patient not taking: Reported on 03/26/2015)  . oxyCODONE-acetaminophen (PERCOCET/ROXICET) 5-325 MG per tablet Take 1-2 tablets by mouth every 6 (six) hours as needed for moderate pain. (Patient not taking: Reported on 03/26/2015)   No facility-administered encounter medications on file as of 06/18/2015.     Patient Active Problem List   Diagnosis Date Noted  . Primary osteoarthritis of right knee 03/26/2015  . Abscess of right breast 02/20/2015  . Genital warts 01/22/2015  . Other male erectile dysfunction 01/22/2015  . Type 2 diabetes mellitus with complication ([place]) 40/97/3532  . Pneumonia 12/14/2014  . HCAP (healthcare-associated pneumonia) 12/07/2014  . Essential hypertension, benign 12/04/2014  . Atopic eczema  12/04/2014  . Dyspnea 12/12/2013  . Acute on chronic respiratory failure ([place]) 12/12/2013  . COPD exacerbation ([place]) 08/23/2013  . Cyst (solitary) of breast 08/23/2013  . Breast abscess 08/23/2013  . OSA (obstructive sleep apnea) 08/23/2013  . Smoker 02/17/2013  . Hyperglycemia 02/17/2013  . Uncontrolled diabetes mellitus ([place]) 02/17/2013  . DM (diabetes mellitus) ([place]) 01/27/2013  . Steroid-induced  hyperglycemia 01/12/2013  . Chronic respiratory failure ([place]) 01/12/2013  . COPD  GOLD III, still smoking 01/11/2013  . Nicotine abuse 01/11/2013  . HIV disease ([place]) 12/06/2012     Health Maintenance Due  Topic Date Due  . FOOT EXAM  04/12/1976  . OPHTHALMOLOGY EXAM  04/12/1976  . TETANUS/TDAP  04/12/1985  . INFLUENZA VACCINE  03/12/2015     Review of Systems + low back pain, occ right leg pain from previous remote surgery Physical Exam   BP 120/75 mmHg  Pulse 102  Temp(Src) 98.8 F (37.1 C) (Oral)  Ht '5\' 9"'  (1.753 m)  Wt 276 lb (125.193 kg)  BMI 40.74 kg/m2  SpO2 93% Physical Exam  Constitutional: He is oriented to person, place, and time. He appears well-developed and well-nourished. No distress. Wearing oxygen HENT:  Mouth/Throat: Oropharynx is clear and moist. No oropharyngeal exudate.  Cardiovascular: Normal rate, regular rhythm and normal heart sounds. Exam reveals no gallop and no friction rub.  No murmur heard.  Pulmonary/Chest: Effort normal and breath sounds normal. No respiratory distress. He has no wheezes.  Abdominal: Soft. Bowel sounds are normal. He exhibits no distension. There is no tenderness.  Lymphadenopathy:  He has no cervical adenopathy.  Neurological: He is alert and oriented to person, place, and time.  Skin: Skin is warm and dry. No rash noted. No erythema.  Psychiatric: He has a normal mood and affect. His behavior is normal.    Lab Results  Component Value Date   CD4TCELL 9* 12/07/2014   Lab Results  Component Value Date   CD4TABS 190* 12/07/2014   CD4TABS 200* 09/21/2014   CD4TABS 180* 05/25/2014   Lab Results  Component Value Date   HIV1RNAQUANT <20 02/27/2015   Lab Results  Component Value Date   HEPBSAB REACTIVE* 12/06/2012   No results found for: RPR  CBC Lab Results  Component Value Date   WBC 6.6 02/27/2015   RBC 4.97 02/27/2015   HGB 14.7 02/27/2015   HCT 44.2 02/27/2015   PLT 207 02/27/2015   MCV 88.9  02/27/2015   MCH 29.6 02/27/2015   MCHC 33.3 02/27/2015   RDW 13.4 02/27/2015   LYMPHSABS 2.4 02/27/2015   MONOABS 0.8 02/27/2015   EOSABS 0.1 02/27/2015   BASOSABS 0.1 02/27/2015   BMET Lab Results  Component Value Date   NA 137 02/27/2015   K 4.3 02/27/2015   CL 100 02/27/2015   CO2 29 02/27/2015   GLUCOSE 109* 02/27/2015   BUN 12 02/27/2015   CREATININE 0.85 02/27/2015   CALCIUM 9.3 02/27/2015   GFRNONAA >89 02/27/2015   GFRAA >89 02/27/2015     Assessment and Plan   hiv disease =continue on current regimen. Will check labs today  ED = getting pap for viagra  chronc back pain = will refill tylenol #3 x once to bridge til his appt with PCP  Health maintenance = will give flu shot

## 2015-06-19 LAB — T-HELPER CELL (CD4) - (RCID CLINIC ONLY)
CD4 T CELL HELPER: 12 % — AB (ref 33–55)
CD4 T Cell Abs: 340 /uL — ABNORMAL LOW (ref 400–2700)

## 2015-06-20 LAB — HIV-1 RNA QUANT-NO REFLEX-BLD

## 2015-07-16 ENCOUNTER — Emergency Department (HOSPITAL_COMMUNITY): Payer: Medicare Other

## 2015-07-16 ENCOUNTER — Encounter (HOSPITAL_COMMUNITY): Payer: Self-pay | Admitting: Family Medicine

## 2015-07-16 ENCOUNTER — Emergency Department (HOSPITAL_COMMUNITY)
Admission: EM | Admit: 2015-07-16 | Discharge: 2015-07-16 | Disposition: A | Discharge status: Home / Self Care | Payer: Medicare Other | Attending: Physician Assistant | Admitting: Physician Assistant

## 2015-07-16 DIAGNOSIS — Z21 Asymptomatic human immunodeficiency virus [HIV] infection status: Secondary | ICD-10-CM | POA: Diagnosis not present

## 2015-07-16 DIAGNOSIS — Z794 Long term (current) use of insulin: Secondary | ICD-10-CM | POA: Insufficient documentation

## 2015-07-16 DIAGNOSIS — R0602 Shortness of breath: Secondary | ICD-10-CM | POA: Diagnosis present

## 2015-07-16 DIAGNOSIS — F17210 Nicotine dependence, cigarettes, uncomplicated: Secondary | ICD-10-CM | POA: Insufficient documentation

## 2015-07-16 DIAGNOSIS — J441 Chronic obstructive pulmonary disease with (acute) exacerbation: Secondary | ICD-10-CM | POA: Insufficient documentation

## 2015-07-16 DIAGNOSIS — Z79899 Other long term (current) drug therapy: Secondary | ICD-10-CM | POA: Diagnosis not present

## 2015-07-16 DIAGNOSIS — E119 Type 2 diabetes mellitus without complications: Secondary | ICD-10-CM | POA: Insufficient documentation

## 2015-07-16 DIAGNOSIS — Z7951 Long term (current) use of inhaled steroids: Secondary | ICD-10-CM | POA: Diagnosis not present

## 2015-07-16 LAB — BASIC METABOLIC PANEL
ANION GAP: 5 (ref 5–15)
BUN: 6 mg/dL (ref 6–20)
CHLORIDE: 101 mmol/L (ref 101–111)
CO2: 31 mmol/L (ref 22–32)
Calcium: 9.1 mg/dL (ref 8.9–10.3)
Creatinine, Ser: 0.92 mg/dL (ref 0.61–1.24)
GFR calc Af Amer: >60 mL/min (ref >60)
Glucose, Bld: 123 mg/dL — ABNORMAL HIGH (ref 65–99)
POTASSIUM: 4.2 mmol/L (ref 3.5–5.1)
SODIUM: 137 mmol/L (ref 135–145)

## 2015-07-16 LAB — CBC
HEMATOCRIT: 46.4 % (ref 39.0–52.0)
HEMOGLOBIN: 14.3 g/dL (ref 13.0–17.0)
MCH: 29.3 pg (ref 26.0–34.0)
MCHC: 30.8 g/dL (ref 30.0–36.0)
MCV: 95.1 fL (ref 78.0–100.0)
Platelets: 194 10*3/uL (ref 150–400)
RBC: 4.88 MIL/uL (ref 4.22–5.81)
RDW: 13 % (ref 11.5–15.5)
WBC: 6.3 10*3/uL (ref 4.0–10.5)

## 2015-07-16 LAB — I-STAT TROPONIN, ED: Troponin i, poc: 0 ng/mL (ref 0.00–0.08)

## 2015-07-16 MED ORDER — IPRATROPIUM-ALBUTEROL 0.5-2.5 (3) MG/3ML IN SOLN
3.0000 mL | Freq: Once | RESPIRATORY_TRACT | Status: AC
  Administered 2015-07-16: 3 mL via RESPIRATORY_TRACT
  Filled 2015-07-16: qty 3

## 2015-07-16 MED ORDER — PREDNISONE 10 MG PO TABS: 40.0000 mg | ORAL_TABLET | Freq: Every day | ORAL | Status: DC

## 2015-07-16 MED ORDER — AMOXICILLIN-POT CLAVULANATE ER 1000-62.5 MG PO TB12: 2.0000 | ORAL_TABLET | Freq: Two times a day (BID) | ORAL | Status: DC

## 2015-07-16 MED ORDER — FENTANYL CITRATE (PF) 100 MCG/2ML IJ SOLN
50.0000 ug | Freq: Once | INTRAMUSCULAR | Status: DC
Start: 2015-07-16 — End: 2015-07-16

## 2015-07-16 MED ORDER — METHYLPREDNISOLONE SODIUM SUCC 125 MG IJ SOLR: 125.0000 mg | Freq: Once | INTRAMUSCULAR | Status: DC

## 2015-07-16 MED ORDER — DOXYCYCLINE HYCLATE 100 MG PO TBEC: 100.0000 mg | DELAYED_RELEASE_TABLET | Freq: Two times a day (BID) | ORAL | Status: DC

## 2015-07-16 MED ORDER — PREDNISONE 20 MG PO TABS
60.0000 mg | ORAL_TABLET | Freq: Once | ORAL | Status: AC
  Administered 2015-07-16: 60 mg via ORAL
  Filled 2015-07-16: qty 3

## 2015-07-16 MED ORDER — LEVOFLOXACIN 750 MG PO TABS
750.0000 mg | ORAL_TABLET | Freq: Once | ORAL | Status: AC
  Administered 2015-07-16: 750 mg via ORAL
  Filled 2015-07-16: qty 1

## 2015-07-16 MED ORDER — ALBUTEROL SULFATE (2.5 MG/3ML) 0.083% IN NEBU
5.0000 mg | INHALATION_SOLUTION | Freq: Once | RESPIRATORY_TRACT | Status: AC
  Administered 2015-07-16: 5 mg via RESPIRATORY_TRACT
  Filled 2015-07-16: qty 6
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
## 2015-07-16 NOTE — ED Notes (Signed)
Pt here for SOB x 3 days. Sts worsening today while doing laundry. Denies pain.pt on home o2

## 2015-07-16 NOTE — ED Provider Notes (Signed)
Care assumed by Dr. Arnoldo MoraleMumma at 4pm. Please see her note for initial HPI, ROS, PE and initial workup.   In brief, patient with h/o COPD here with COPD exacerbation. Wears 2L O2 at home. Requiring 3 nebs and 4L [place] here.   Plan is to reassess after 3rd neb. ABx and steroids given.  MDM Patient reassessed and feels better. Sats reasonable on 2L. Patient requests discharge. Strict return precautions given. Will d/c home with augmentin and doxy for continued Tx. I provided verbal discharge instructions including follow up and return precautions.   Stable for d/c.  Discussed with Dr. Corlis LeakMacKuen.    Chris BergerJonah Joylynn Defrancesco, MD 07/16/15 2251  Chris Randall AnLyn Mackuen, MD 07/16/15 [id number]

## 2015-07-18 ENCOUNTER — Encounter: Payer: Self-pay | Admitting: Family Medicine

## 2015-07-18 ENCOUNTER — Ambulatory Visit: Payer: Medicare Other | Attending: Family Medicine | Admitting: Family Medicine

## 2015-07-18 ENCOUNTER — Telehealth: Payer: Self-pay | Admitting: *Deleted

## 2015-07-18 VITALS — BP 129/77 | HR 107 | Temp 98.1°F | Resp 18 | Ht 69.0 in | Wt 281.0 lb

## 2015-07-18 DIAGNOSIS — R3589 Other polyuria: Secondary | ICD-10-CM

## 2015-07-18 DIAGNOSIS — J441 Chronic obstructive pulmonary disease with (acute) exacerbation: Secondary | ICD-10-CM

## 2015-07-18 DIAGNOSIS — E118 Type 2 diabetes mellitus with unspecified complications: Secondary | ICD-10-CM

## 2015-07-18 DIAGNOSIS — M1711 Unilateral primary osteoarthritis, right knee: Secondary | ICD-10-CM | POA: Diagnosis not present

## 2015-07-18 DIAGNOSIS — B20 Human immunodeficiency virus [HIV] disease: Secondary | ICD-10-CM | POA: Insufficient documentation

## 2015-07-18 DIAGNOSIS — Z79899 Other long term (current) drug therapy: Secondary | ICD-10-CM | POA: Diagnosis not present

## 2015-07-18 DIAGNOSIS — Z9981 Dependence on supplemental oxygen: Secondary | ICD-10-CM | POA: Diagnosis not present

## 2015-07-18 DIAGNOSIS — R739 Hyperglycemia, unspecified: Secondary | ICD-10-CM

## 2015-07-18 DIAGNOSIS — R358 Other polyuria: Secondary | ICD-10-CM | POA: Diagnosis not present

## 2015-07-18 DIAGNOSIS — Z882 Allergy status to sulfonamides status: Secondary | ICD-10-CM | POA: Insufficient documentation

## 2015-07-18 DIAGNOSIS — Z7984 Long term (current) use of oral hypoglycemic drugs: Secondary | ICD-10-CM | POA: Diagnosis not present

## 2015-07-18 DIAGNOSIS — F17210 Nicotine dependence, cigarettes, uncomplicated: Secondary | ICD-10-CM | POA: Diagnosis not present

## 2015-07-18 DIAGNOSIS — Z794 Long term (current) use of insulin: Secondary | ICD-10-CM | POA: Diagnosis not present

## 2015-07-18 DIAGNOSIS — I10 Essential (primary) hypertension: Secondary | ICD-10-CM | POA: Insufficient documentation

## 2015-07-18 DIAGNOSIS — Z720 Tobacco use: Secondary | ICD-10-CM

## 2015-07-18 DIAGNOSIS — F1910 Other psychoactive substance abuse, uncomplicated: Secondary | ICD-10-CM

## 2015-07-18 LAB — POCT URINALYSIS DIPSTICK
BILIRUBIN UA: NEGATIVE
GLUCOSE UA: 500
KETONES UA: NEGATIVE
Leukocytes, UA: NEGATIVE
NITRITE UA: NEGATIVE
PH UA: 6
Protein, UA: NEGATIVE
SPEC GRAV UA: 1.015
Urobilinogen, UA: 1

## 2015-07-18 LAB — POCT GLYCOSYLATED HEMOGLOBIN (HGB A1C): HEMOGLOBIN A1C: 6

## 2015-07-18 LAB — GLUCOSE, POCT (MANUAL RESULT ENTRY): POC GLUCOSE: 321 mg/dL — AB (ref 70–99)

## 2015-07-18 MED ORDER — ACETAMINOPHEN-CODEINE #3 300-30 MG PO TABS: 1.0000 | ORAL_TABLET | Freq: Three times a day (TID) | ORAL | Status: DC | PRN

## 2015-07-18 MED ORDER — ALBUTEROL SULFATE (5 MG/ML) 0.5% IN NEBU: 2.5000 mg | INHALATION_SOLUTION | RESPIRATORY_TRACT | Status: DC | PRN

## 2015-07-18 NOTE — Progress Notes (Signed)
CC: ED follow-up for COPD exacerbation  HPI: Chris [name] a 49 y.o. male the history of COPD, tobacco abuse,  type 2 diabetes mellitus (A1c of 6.0) HIV (CD4  count 340), hypertension who was recently seen at [facility] ED 2 days ago for COPD exacerbation. Chest x-ray was negative for active cardiopulmonary disease He received repeated DuoNeb treatments and IV steroids with subsequent improvement in his symptoms and he was subsequently discharged on Augmentin and doxycycline.  Today he complains that he has not been able to pick up his doxycycline or Augmentin due to the fact that the pharmacy had informed him he needed to obtain approval from the doctor. He complains of polyuria but also endorses the fact that he forgets to take his Lantus and sometimes he sugars register in the 300s. He denies dysuria.   He is requesting a refill of Tylenol No. 3 which she takes for chronic back and knee pains  Patient has No headache, No chest pain, No abdominal pain - No Nausea, No new weakness tingling or numbness, No Cough - SOB.  Allergies  Allergen Reactions  . Bactrim [Sulfamethoxazole-Trimethoprim] Hives   Past Medical History  Diagnosis Date  . COPD (chronic obstructive pulmonary disease) ([place])   . HIV disease ([place])   . Emphysema   . Diabetes mellitus without complication [facility])    Current Outpatient Prescriptions on File Prior to Visit  Medication Sig Dispense Refill  . albuterol (PROVENTIL HFA;VENTOLIN HFA) 108 (90 BASE) MCG/ACT inhaler Inhale 2 puffs into the lungs every 4 (four) hours as needed for wheezing or shortness of breath (((PLAN B))). 3 Inhaler 3  . beclomethasone (QVAR) 80 MCG/ACT inhaler Inhale 2 puffs into the lungs 2 (two) times daily. 3 Inhaler 3  . Blood Glucose Monitoring Suppl (ACCU-CHEK AVIVA PLUS) W/DEVICE KIT 250 Test up to 4 times daily 1 kit 0  . dapsone 100 MG tablet TAKE 1 TABLET BY MOUTH DAILY 30 tablet 5  . Darunavir Ethanolate (PREZISTA) 800 MG tablet Take  1 tablet (800 mg total) by mouth daily with breakfast. 30 tablet 11  . elvitegravir-cobicistat-emtricitabine-tenofovir (STRIBILD) 150-150-200-300 MG TABS tablet Take 1 tablet by mouth daily with breakfast. 30 tablet 11  . glucose blood test strip Use as instructed 100 each 12  . Insulin Pen Needle 32G X 5 MM MISC Use as directed 100 each 11  . lisinopril (PRINIVIL) 10 MG tablet Take 1 tablet (10 mg total) by mouth daily. 90 tablet 3  . metFORMIN (GLUCOPHAGE) 500 MG tablet Take 1 tablet (500 mg total) by mouth 2 (two) times daily with a meal. 180 tablet 3  . predniSONE (DELTASONE) 10 MG tablet Take 4 tablets (40 mg total) by mouth daily with breakfast. 20 tablet 0  . sildenafil (VIAGRA) 50 MG tablet Take 0.5 tablets (25 mg total) by mouth daily as needed for erectile dysfunction. 30 tablet 5  . tiotropium (SPIRIVA HANDIHALER) 18 MCG inhalation capsule Place 1 capsule (18 mcg total) into inhaler and inhale every morning. 30 capsule 12  . amoxicillin-clavulanate (AUGMENTIN XR) 1000-62.5 MG 12 hr tablet Take 2 tablets by mouth 2 (two) times daily. (Patient not taking: Reported on 07/18/2015) 28 tablet 0  . doxycycline (DORYX) 100 MG EC tablet Take 1 tablet (100 mg total) by mouth 2 (two) times daily. (Patient not taking: Reported on 07/18/2015) 14 tablet 0  . insulin glargine (LANTUS) 100 UNIT/ML injection Inject 0.1 mLs (10 Units total) into the skin at bedtime. (Patient not taking: Reported  on 07/18/2015) 10 mL 11   No current facility-administered medications on file prior to visit.   Family History  Problem Relation Age of Onset  . Emphysema Maternal Uncle     was a smoker  . Asthma Maternal Uncle   . Asthma Maternal Aunt   . Throat cancer Maternal Grandmother     never smoker, used snuff  . Breast cancer Maternal Aunt   . Cancer Father 58    throat   Social History   Social History  . Marital Status: Married    Spouse Name: N/A  . Number of Children: 0  . Years of Education: N/A    Occupational History  . Unemployed    Social History Main Topics  . Smoking status: Current Some Day Smoker -- 0.25 packs/day    Types: Cigarettes  . Smokeless tobacco: Never Used     Comment: Started smoking at age 47.  Currently smoking 2 cig per day. cautioned with O2 use  . Alcohol Use: No  . Drug Use: No     Comment: quit 04  . Sexual Activity: Yes    Birth Control/ Protection: Condom   Other Topics Concern  . Not on file   Social History Narrative    Review of Systems: Constitutional: Negative for fever, chills, diaphoresis, activity change, appetite change and fatigue. HENT: Negative for ear pain, nosebleeds, congestion, facial swelling, rhinorrhea, neck pain, neck stiffness and ear discharge.  Eyes: Negative for pain, discharge, redness, itching and visual disturbance. Respiratory: Negative for cough, choking, chest tightness, positive for shortness of breath which is relieved by oxygen, negative for wheezing and stridor.  Cardiovascular: Negative for chest pain, palpitations and leg swelling. Gastrointestinal: Negative for abdominal distention. Genitourinary: Negative for dysuria, urgency, frequency, hematuria, flank pain, decreased urine volume, difficulty urinating and dyspareunia.  Musculoskeletal: Positive for back and knee pain Neurological: Negative for dizziness, tremors, seizures, syncope, facial asymmetry, speech difficulty, weakness, light-headedness, numbness and headaches.  Hematological: Negative for adenopathy. Does not bruise/bleed easily. Psychiatric/Behavioral: Negative for hallucinations, behavioral problems, confusion, dysphoric mood, decreased concentration and agitation.    Objective:   Filed Vitals:   07/18/15 1033  BP: 129/77  Pulse: 107  Temp: 98.1 F (36.7 C)  Resp: 18    Physical Exam: Constitutional: Patient appears well-developed and well-nourished. No distress, on 2L liter oxygen via nasal cannula. CVS: Tachycardic rate, S1/S2  +, no murmurs, no gallops, no carotid bruit.  Pulmonary: Effort and breath sounds normal, no stridor, rhonchi, wheezes, rales.  Abdominal: Soft. BS +,  no distension, tenderness, rebound or guarding.  Musculoskeletal: Normal range of motion. No edema and no tenderness.  Lymphadenopathy: No lymphadenopathy noted, cervical, inguinal or axillary Neuro: Alert. Normal reflexes, muscle tone coordination. No cranial nerve deficit. Skin: Skin is warm and dry. No rash noted. Not diaphoretic. No erythema. No pallor. Psychiatric: Normal mood and affect. Behavior, judgment, thought content normal.  Lab Results  Component Value Date   WBC 6.3 07/16/2015   HGB 14.3 07/16/2015   HCT 46.4 07/16/2015   MCV 95.1 07/16/2015   PLT 194 07/16/2015   Lab Results  Component Value Date   CREATININE 0.92 07/16/2015   BUN 6 07/16/2015   NA 137 07/16/2015   K 4.2 07/16/2015   CL 101 07/16/2015   CO2 31 07/16/2015    Lab Results  Component Value Date   HGBA1C 6.0 07/18/2015   Lipid Panel     Component Value Date/Time   CHOL 233* 02/27/2015 1218  TRIG 136 02/27/2015 1218   HDL 28* 02/27/2015 1218   CHOLHDL 8.3 02/27/2015 1218   VLDL 27 02/27/2015 1218   LDLCALC 178* 02/27/2015 1218       Assessment and plan:   COPD exacerbation: Asymptomatic at this time.  We have called the pharmacy to rectify the issue with his doxycycline and Augmentin.  Type 2 diabetes mellitus: Controlled with A1c of 6.0 Compliance with Lantus empathized. He has been advised to check his sugars and record them and this will be reviewed at his next office visit.  Polyuria: This could be secondary to hyperglycemia from his frequent ingestion of sodas which I have discouraged and also he's forgetting to take his insulins. I have encouraged him to come up with the reminder system so he can take his medications as prescribed.  Chronic knee and back pain. Refill Tylenol 3 until visit with PCP.  Tobacco abuse: Spent 3  minutes counseling on cessation and he is not ready to quit at this time.   Arnoldo Morale, [place] [facility] [phone] 07/18/2015, 10:58 AM

## 2015-07-18 NOTE — Progress Notes (Signed)
Follow up on COPD Chronic 02 use at 2L 02 91% this am "that is good for me" Patient smokes 7 cigarettes per week Needs to get both antibiotics that were prescribed in hospital-patient states his pharmacy needs MD approval on one of them he is not sure which one?? He is forgetting to take his Lantus He reports polyuria and urine incontinence He needs refill on his nebulizer solution

## 2015-07-18 NOTE — Telephone Encounter (Signed)
Pharmacy called related to Rx:  amoxicillin-clavulanate (AUGMENTIN XR) 1000-62.5 MG 12 hr tablet is on back order.[name].[name].NCM clarified with EDP to change Rx to: amoxicillin-clavulanate (AUGMENTIN XR) 875-125 MG 12 hr tablet.

## 2015-07-24 ENCOUNTER — Ambulatory Visit (HOSPITAL_BASED_OUTPATIENT_CLINIC_OR_DEPARTMENT_OTHER): Payer: Medicaid Other

## 2015-07-24 ENCOUNTER — Ambulatory Visit (HOSPITAL_BASED_OUTPATIENT_CLINIC_OR_DEPARTMENT_OTHER): Payer: Medicaid Other | Attending: Anesthesiology

## 2015-07-25 ENCOUNTER — Other Ambulatory Visit: Payer: Self-pay | Admitting: Internal Medicine

## 2015-07-25 DIAGNOSIS — B20 Human immunodeficiency virus [HIV] disease: Secondary | ICD-10-CM

## 2015-07-26 ENCOUNTER — Telehealth: Payer: Self-pay | Admitting: *Deleted

## 2015-07-26 NOTE — Telephone Encounter (Signed)
Viagra 50mg  #30 received. Patient notified. NDC [id number] Lot [id number] Exp 12/09/19

## 2015-08-13 ENCOUNTER — Encounter (HOSPITAL_COMMUNITY): Payer: Self-pay | Admitting: Emergency Medicine

## 2015-08-13 ENCOUNTER — Emergency Department (HOSPITAL_COMMUNITY): Payer: Medicare Other

## 2015-08-13 ENCOUNTER — Inpatient Hospital Stay (HOSPITAL_COMMUNITY)
Admission: EM | Admit: 2015-08-13 | Discharge: 2015-08-15 | DRG: 189 | Disposition: A | Discharge status: Home / Self Care | Payer: Medicare Other | Attending: Internal Medicine | Admitting: Internal Medicine

## 2015-08-13 DIAGNOSIS — Z7951 Long term (current) use of inhaled steroids: Secondary | ICD-10-CM

## 2015-08-13 DIAGNOSIS — J441 Chronic obstructive pulmonary disease with (acute) exacerbation: Secondary | ICD-10-CM | POA: Diagnosis not present

## 2015-08-13 DIAGNOSIS — Z9981 Dependence on supplemental oxygen: Secondary | ICD-10-CM | POA: Diagnosis not present

## 2015-08-13 DIAGNOSIS — J440 Chronic obstructive pulmonary disease with acute lower respiratory infection: Secondary | ICD-10-CM | POA: Diagnosis present

## 2015-08-13 DIAGNOSIS — B20 Human immunodeficiency virus [HIV] disease: Secondary | ICD-10-CM | POA: Diagnosis present

## 2015-08-13 DIAGNOSIS — Z881 Allergy status to other antibiotic agents status: Secondary | ICD-10-CM

## 2015-08-13 DIAGNOSIS — J9621 Acute and chronic respiratory failure with hypoxia: Secondary | ICD-10-CM | POA: Diagnosis not present

## 2015-08-13 DIAGNOSIS — E875 Hyperkalemia: Secondary | ICD-10-CM | POA: Diagnosis present

## 2015-08-13 DIAGNOSIS — R0602 Shortness of breath: Secondary | ICD-10-CM | POA: Diagnosis not present

## 2015-08-13 DIAGNOSIS — J189 Pneumonia, unspecified organism: Secondary | ICD-10-CM | POA: Diagnosis present

## 2015-08-13 DIAGNOSIS — E118 Type 2 diabetes mellitus with unspecified complications: Secondary | ICD-10-CM | POA: Diagnosis present

## 2015-08-13 DIAGNOSIS — R0600 Dyspnea, unspecified: Secondary | ICD-10-CM | POA: Diagnosis not present

## 2015-08-13 DIAGNOSIS — J9601 Acute respiratory failure with hypoxia: Secondary | ICD-10-CM | POA: Diagnosis present

## 2015-08-13 DIAGNOSIS — I10 Essential (primary) hypertension: Secondary | ICD-10-CM | POA: Diagnosis present

## 2015-08-13 DIAGNOSIS — R0603 Acute respiratory distress: Secondary | ICD-10-CM

## 2015-08-13 DIAGNOSIS — F17210 Nicotine dependence, cigarettes, uncomplicated: Secondary | ICD-10-CM | POA: Diagnosis present

## 2015-08-13 DIAGNOSIS — Z794 Long term (current) use of insulin: Secondary | ICD-10-CM

## 2015-08-13 DIAGNOSIS — R0902 Hypoxemia: Secondary | ICD-10-CM

## 2015-08-13 DIAGNOSIS — G4733 Obstructive sleep apnea (adult) (pediatric): Secondary | ICD-10-CM | POA: Diagnosis present

## 2015-08-13 MED ORDER — IPRATROPIUM BROMIDE 0.02 % IN SOLN
1.0000 mg | Freq: Once | RESPIRATORY_TRACT | Status: AC
  Administered 2015-08-14: 1 mg via RESPIRATORY_TRACT
  Filled 2015-08-13: qty 5

## 2015-08-13 MED ORDER — SODIUM CHLORIDE 0.9 % IV BOLUS (SEPSIS)
1000.0000 mL | Freq: Once | INTRAVENOUS | Status: AC
  Administered 2015-08-14: 1000 mL via INTRAVENOUS

## 2015-08-13 MED ORDER — PREDNISONE 20 MG PO TABS
60.0000 mg | ORAL_TABLET | Freq: Once | ORAL | Status: AC
  Administered 2015-08-14: 60 mg via ORAL
  Filled 2015-08-13: qty 3

## 2015-08-13 MED ORDER — ACETAMINOPHEN-CODEINE #3 300-30 MG PO TABS
2.0000 | ORAL_TABLET | Freq: Once | ORAL | Status: AC
  Administered 2015-08-14: 2 via ORAL
  Filled 2015-08-13: qty 2

## 2015-08-13 MED ORDER — ALBUTEROL (5 MG/ML) CONTINUOUS INHALATION SOLN
10.0000 mg/h | INHALATION_SOLUTION | RESPIRATORY_TRACT | Status: DC
  Administered 2015-08-14: 10 mg/h via RESPIRATORY_TRACT
  Filled 2015-08-13: qty 20

## 2015-08-13 MED ORDER — ALBUTEROL SULFATE (2.5 MG/3ML) 0.083% IN NEBU: 5.0000 mg | INHALATION_SOLUTION | Freq: Once | RESPIRATORY_TRACT | Status: DC

## 2015-08-13 MED ORDER — AZITHROMYCIN 250 MG PO TABS
500.0000 mg | ORAL_TABLET | Freq: Once | ORAL | Status: AC
  Administered 2015-08-14: 500 mg via ORAL
  Filled 2015-08-13: qty 2

## 2015-08-13 MED ORDER — MAGNESIUM SULFATE 2 GM/50ML IV SOLN
2.0000 g | Freq: Once | INTRAVENOUS | Status: AC
  Administered 2015-08-14: 2 g via INTRAVENOUS
  Filled 2015-08-13: qty 50
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
## 2015-08-13 NOTE — ED Notes (Signed)
Pt. reports SOB with productive cough , bilateral ribcage pain and lower back onset 3 days ago , denies fever or chills , pain increases when coughing or deep inspirations .

## 2015-08-13 NOTE — ED Notes (Signed)
Patient transported to X-ray 

## 2015-08-14 ENCOUNTER — Encounter (HOSPITAL_COMMUNITY): Payer: Self-pay | Admitting: *Deleted

## 2015-08-14 ENCOUNTER — Ambulatory Visit (HOSPITAL_COMMUNITY): Payer: Medicare Other

## 2015-08-14 DIAGNOSIS — J9621 Acute and chronic respiratory failure with hypoxia: Secondary | ICD-10-CM | POA: Diagnosis present

## 2015-08-14 DIAGNOSIS — G4733 Obstructive sleep apnea (adult) (pediatric): Secondary | ICD-10-CM | POA: Diagnosis not present

## 2015-08-14 DIAGNOSIS — F17210 Nicotine dependence, cigarettes, uncomplicated: Secondary | ICD-10-CM | POA: Diagnosis present

## 2015-08-14 DIAGNOSIS — J440 Chronic obstructive pulmonary disease with acute lower respiratory infection: Secondary | ICD-10-CM | POA: Diagnosis present

## 2015-08-14 DIAGNOSIS — J189 Pneumonia, unspecified organism: Secondary | ICD-10-CM | POA: Diagnosis present

## 2015-08-14 DIAGNOSIS — E119 Type 2 diabetes mellitus without complications: Secondary | ICD-10-CM | POA: Diagnosis not present

## 2015-08-14 DIAGNOSIS — Z881 Allergy status to other antibiotic agents status: Secondary | ICD-10-CM | POA: Diagnosis not present

## 2015-08-14 DIAGNOSIS — Z794 Long term (current) use of insulin: Secondary | ICD-10-CM | POA: Diagnosis not present

## 2015-08-14 DIAGNOSIS — E118 Type 2 diabetes mellitus with unspecified complications: Secondary | ICD-10-CM | POA: Diagnosis present

## 2015-08-14 DIAGNOSIS — B20 Human immunodeficiency virus [HIV] disease: Secondary | ICD-10-CM

## 2015-08-14 DIAGNOSIS — R0603 Acute respiratory distress: Secondary | ICD-10-CM | POA: Insufficient documentation

## 2015-08-14 DIAGNOSIS — Z7951 Long term (current) use of inhaled steroids: Secondary | ICD-10-CM | POA: Diagnosis not present

## 2015-08-14 DIAGNOSIS — I10 Essential (primary) hypertension: Secondary | ICD-10-CM | POA: Diagnosis not present

## 2015-08-14 DIAGNOSIS — Z9981 Dependence on supplemental oxygen: Secondary | ICD-10-CM | POA: Diagnosis not present

## 2015-08-14 DIAGNOSIS — J9601 Acute respiratory failure with hypoxia: Secondary | ICD-10-CM | POA: Diagnosis not present

## 2015-08-14 DIAGNOSIS — Z21 Asymptomatic human immunodeficiency virus [HIV] infection status: Secondary | ICD-10-CM

## 2015-08-14 DIAGNOSIS — R0600 Dyspnea, unspecified: Secondary | ICD-10-CM | POA: Diagnosis present

## 2015-08-14 DIAGNOSIS — E875 Hyperkalemia: Secondary | ICD-10-CM | POA: Diagnosis present

## 2015-08-14 DIAGNOSIS — J441 Chronic obstructive pulmonary disease with (acute) exacerbation: Secondary | ICD-10-CM | POA: Diagnosis not present

## 2015-08-14 LAB — CBC WITH DIFFERENTIAL/PLATELET
BASOS ABS: 0 10*3/uL (ref 0.0–0.1)
BASOS PCT: 0 %
Basophils Absolute: 0 K/uL (ref 0.0–0.1)
Basophils Relative: 0 %
EOS ABS: 0 10*3/uL (ref 0.0–0.7)
EOS PCT: 0 %
Eosinophils Absolute: 0.1 K/uL (ref 0.0–0.7)
Eosinophils Relative: 2 %
HCT: 42.6 % (ref 39.0–52.0)
HCT: 43.3 % (ref 39.0–52.0)
Hemoglobin: 13 g/dL (ref 13.0–17.0)
Hemoglobin: 13.3 g/dL (ref 13.0–17.0)
Lymphocytes Relative: 35 %
Lymphocytes Relative: 7 %
Lymphs Abs: 0.4 10*3/uL — ABNORMAL LOW (ref 0.7–4.0)
Lymphs Abs: 2.2 K/uL (ref 0.7–4.0)
MCH: 29 pg (ref 26.0–34.0)
MCH: 29.1 pg (ref 26.0–34.0)
MCHC: 30.5 g/dL (ref 30.0–36.0)
MCHC: 30.7 g/dL (ref 30.0–36.0)
MCV: 94.5 fL (ref 78.0–100.0)
MCV: 95.5 fL (ref 78.0–100.0)
MONO ABS: 0.2 10*3/uL (ref 0.1–1.0)
Monocytes Absolute: 0.8 K/uL (ref 0.1–1.0)
Monocytes Relative: 13 %
Monocytes Relative: 3 %
NEUTROS ABS: 5.2 10*3/uL (ref 1.7–7.7)
Neutro Abs: 3.2 K/uL (ref 1.7–7.7)
Neutrophils Relative %: 50 %
Neutrophils Relative %: 90 %
PLATELETS: 144 10*3/uL — AB (ref 150–400)
Platelets: 156 K/uL (ref 150–400)
RBC: 4.46 MIL/uL (ref 4.22–5.81)
RBC: 4.58 MIL/uL (ref 4.22–5.81)
RDW: 12.9 % (ref 11.5–15.5)
RDW: 12.9 % (ref 11.5–15.5)
WBC: 5.8 10*3/uL (ref 4.0–10.5)
WBC: 6.4 K/uL (ref 4.0–10.5)

## 2015-08-14 LAB — GLUCOSE, CAPILLARY
Glucose-Capillary: 169 mg/dL — ABNORMAL HIGH (ref 65–99)
Glucose-Capillary: 257 mg/dL — ABNORMAL HIGH (ref 65–99)
Glucose-Capillary: 155 mg/dL — ABNORMAL HIGH (ref 65–99)
Glucose-Capillary: 274 mg/dL — ABNORMAL HIGH (ref 65–99)

## 2015-08-14 LAB — BASIC METABOLIC PANEL
Anion gap: 5 (ref 5–15)
BUN: 9 mg/dL (ref 6–20)
CHLORIDE: 102 mmol/L (ref 101–111)
CO2: 29 mmol/L (ref 22–32)
Calcium: 8.3 mg/dL — ABNORMAL LOW (ref 8.9–10.3)
Creatinine, Ser: 0.81 mg/dL (ref 0.61–1.24)
GFR calc non Af Amer: >60 mL/min (ref >60)
Glucose, Bld: 171 mg/dL — ABNORMAL HIGH (ref 65–99)
POTASSIUM: 4.7 mmol/L (ref 3.5–5.1)
SODIUM: 136 mmol/L (ref 135–145)

## 2015-08-14 LAB — COMPREHENSIVE METABOLIC PANEL
ALBUMIN: 3.1 g/dL — AB (ref 3.5–5.0)
ALT: 71 U/L — ABNORMAL HIGH (ref 17–63)
AST: 47 U/L — AB (ref 15–41)
Alkaline Phosphatase: 37 U/L — ABNORMAL LOW (ref 38–126)
Anion gap: 8 (ref 5–15)
BUN: 8 mg/dL (ref 6–20)
CHLORIDE: 98 mmol/L — AB (ref 101–111)
CO2: 28 mmol/L (ref 22–32)
Calcium: 8.1 mg/dL — ABNORMAL LOW (ref 8.9–10.3)
Creatinine, Ser: 0.85 mg/dL (ref 0.61–1.24)
GFR calc Af Amer: >60 mL/min (ref >60)
GFR calc non Af Amer: >60 mL/min (ref >60)
GLUCOSE: 362 mg/dL — AB (ref 65–99)
POTASSIUM: 4.6 mmol/L (ref 3.5–5.1)
SODIUM: 134 mmol/L — AB (ref 135–145)
Total Bilirubin: 0.2 mg/dL — ABNORMAL LOW (ref 0.3–1.2)
Total Protein: 7.9 g/dL (ref 6.5–8.1)

## 2015-08-14 LAB — LACTATE DEHYDROGENASE: LDH: 212 U/L — ABNORMAL HIGH (ref 98–192)

## 2015-08-14 LAB — BRAIN NATRIURETIC PEPTIDE: B NATRIURETIC PEPTIDE 5: 21.5 pg/mL (ref 0.0–100.0)

## 2015-08-14 LAB — D-DIMER, QUANTITATIVE: D-Dimer, Quant: 0.37 ug/mL-FEU (ref 0.00–0.50)

## 2015-08-14 MED ORDER — ACETAMINOPHEN 325 MG PO TABS: 650.0000 mg | ORAL_TABLET | Freq: Four times a day (QID) | ORAL | Status: DC | PRN

## 2015-08-14 MED ORDER — ALBUTEROL SULFATE (2.5 MG/3ML) 0.083% IN NEBU: 2.5000 mg | INHALATION_SOLUTION | RESPIRATORY_TRACT | Status: DC | PRN

## 2015-08-14 MED ORDER — SODIUM CHLORIDE 0.9 % IJ SOLN
3.0000 mL | Freq: Two times a day (BID) | INTRAMUSCULAR | Status: DC
  Administered 2015-08-14 – 2015-08-15 (×3): 3 mL via INTRAVENOUS

## 2015-08-14 MED ORDER — CETYLPYRIDINIUM CHLORIDE 0.05 % MT LIQD
7.0000 mL | Freq: Two times a day (BID) | OROMUCOSAL | Status: DC
  Administered 2015-08-14 – 2015-08-15 (×3): 7 mL via OROMUCOSAL

## 2015-08-14 MED ORDER — INSULIN ASPART 100 UNIT/ML ~~LOC~~ SOLN
4.0000 [IU] | Freq: Three times a day (TID) | SUBCUTANEOUS | Status: DC
  Administered 2015-08-14 – 2015-08-15 (×4): 4 [IU] via SUBCUTANEOUS

## 2015-08-14 MED ORDER — PRIMAQUINE PHOSPHATE 26.3 MG PO TABS
30.0000 mg | ORAL_TABLET | Freq: Once | ORAL | Status: DC
  Filled 2015-08-14: qty 2

## 2015-08-14 MED ORDER — METHYLPREDNISOLONE SODIUM SUCC 40 MG IJ SOLR: 40.0000 mg | Freq: Every day | INTRAMUSCULAR | Status: DC

## 2015-08-14 MED ORDER — BUDESONIDE 0.25 MG/2ML IN SUSP
0.2500 mg | Freq: Two times a day (BID) | RESPIRATORY_TRACT | Status: DC
  Administered 2015-08-14 – 2015-08-15 (×3): 0.25 mg via RESPIRATORY_TRACT
  Filled 2015-08-14 (×3): qty 2

## 2015-08-14 MED ORDER — ELVITEG-COBIC-EMTRICIT-TENOFAF 150-150-200-10 MG PO TABS
1.0000 | ORAL_TABLET | Freq: Every day | ORAL | Status: DC
  Administered 2015-08-14 – 2015-08-15 (×2): 1 via ORAL
  Filled 2015-08-14 (×3): qty 1

## 2015-08-14 MED ORDER — IPRATROPIUM BROMIDE 0.02 % IN SOLN
0.5000 mg | RESPIRATORY_TRACT | Status: DC
  Administered 2015-08-14: 0.5 mg via RESPIRATORY_TRACT
  Filled 2015-08-14: qty 2.5

## 2015-08-14 MED ORDER — METHYLPREDNISOLONE SODIUM SUCC 125 MG IJ SOLR
60.0000 mg | Freq: Three times a day (TID) | INTRAMUSCULAR | Status: DC
  Administered 2015-08-14 – 2015-08-15 (×4): 60 mg via INTRAVENOUS
  Filled 2015-08-14 (×4): qty 2

## 2015-08-14 MED ORDER — FUROSEMIDE 10 MG/ML IJ SOLN
20.0000 mg | Freq: Once | INTRAMUSCULAR | Status: AC
  Administered 2015-08-14: 20 mg via INTRAVENOUS

## 2015-08-14 MED ORDER — DAPSONE 100 MG PO TABS
100.0000 mg | ORAL_TABLET | Freq: Every day | ORAL | Status: DC
  Administered 2015-08-14 – 2015-08-15 (×2): 100 mg via ORAL
  Filled 2015-08-14 (×2): qty 1

## 2015-08-14 MED ORDER — ONDANSETRON HCL 4 MG/2ML IJ SOLN: 4.0000 mg | Freq: Four times a day (QID) | INTRAMUSCULAR | Status: DC | PRN

## 2015-08-14 MED ORDER — CLINDAMYCIN PHOSPHATE 900 MG/50ML IV SOLN: 900.0000 mg | Freq: Once | INTRAVENOUS | Status: DC

## 2015-08-14 MED ORDER — INSULIN ASPART 100 UNIT/ML ~~LOC~~ SOLN
0.0000 [IU] | Freq: Three times a day (TID) | SUBCUTANEOUS | Status: DC
  Administered 2015-08-14: 8 [IU] via SUBCUTANEOUS
  Administered 2015-08-14: 3 [IU] via SUBCUTANEOUS
  Administered 2015-08-14: 8 [IU] via SUBCUTANEOUS
  Administered 2015-08-15: 11 [IU] via SUBCUTANEOUS
  Administered 2015-08-15: 5 [IU] via SUBCUTANEOUS

## 2015-08-14 MED ORDER — ENOXAPARIN SODIUM 60 MG/0.6ML ~~LOC~~ SOLN
60.0000 mg | SUBCUTANEOUS | Status: DC
  Administered 2015-08-14 – 2015-08-15 (×2): 60 mg via SUBCUTANEOUS
  Filled 2015-08-14 (×2): qty 0.6

## 2015-08-14 MED ORDER — INSULIN GLARGINE 100 UNIT/ML ~~LOC~~ SOLN
10.0000 [IU] | Freq: Every day | SUBCUTANEOUS | Status: DC
  Administered 2015-08-14: 10 [IU] via SUBCUTANEOUS
  Filled 2015-08-14 (×2): qty 0.1

## 2015-08-14 MED ORDER — ACETAMINOPHEN 650 MG RE SUPP: 650.0000 mg | Freq: Four times a day (QID) | RECTAL | Status: DC | PRN

## 2015-08-14 MED ORDER — DEXTROSE 5 % IV SOLN
500.0000 mg | INTRAVENOUS | Status: DC
  Administered 2015-08-15: 500 mg via INTRAVENOUS
  Filled 2015-08-14 (×2): qty 500

## 2015-08-14 MED ORDER — IPRATROPIUM-ALBUTEROL 0.5-2.5 (3) MG/3ML IN SOLN
3.0000 mL | RESPIRATORY_TRACT | Status: DC
Start: 2015-08-14 — End: 2015-08-15
  Administered 2015-08-14 – 2015-08-15 (×5): 3 mL via RESPIRATORY_TRACT
  Filled 2015-08-14 (×7): qty 3

## 2015-08-14 MED ORDER — ALBUTEROL SULFATE (2.5 MG/3ML) 0.083% IN NEBU
2.5000 mg | INHALATION_SOLUTION | RESPIRATORY_TRACT | Status: DC
  Administered 2015-08-14: 2.5 mg via RESPIRATORY_TRACT
  Filled 2015-08-14: qty 3

## 2015-08-14 MED ORDER — PERFLUTREN LIPID MICROSPHERE
1.0000 mL | INTRAVENOUS | Status: AC | PRN
  Administered 2015-08-14: 2 mL via INTRAVENOUS
  Filled 2015-08-14: qty 10

## 2015-08-14 MED ORDER — ONDANSETRON HCL 4 MG PO TABS: 4.0000 mg | ORAL_TABLET | Freq: Four times a day (QID) | ORAL | Status: DC | PRN

## 2015-08-14 MED ORDER — BECLOMETHASONE DIPROPIONATE 80 MCG/ACT IN AERS: 2.0000 | INHALATION_SPRAY | Freq: Two times a day (BID) | RESPIRATORY_TRACT | Status: DC

## 2015-08-14 MED ORDER — LISINOPRIL 10 MG PO TABS
10.0000 mg | ORAL_TABLET | Freq: Every day | ORAL | Status: DC
  Administered 2015-08-14 – 2015-08-15 (×2): 10 mg via ORAL
  Filled 2015-08-14 (×2): qty 1

## 2015-08-14 MED ORDER — DARUNAVIR ETHANOLATE 800 MG PO TABS
800.0000 mg | ORAL_TABLET | Freq: Every day | ORAL | Status: DC
  Administered 2015-08-14 – 2015-08-15 (×2): 800 mg via ORAL
  Filled 2015-08-14 (×3): qty 1

## 2015-08-14 MED ORDER — DEXTROSE 5 % IV SOLN
2.0000 g | Freq: Once | INTRAVENOUS | Status: AC
  Administered 2015-08-14: 2 g via INTRAVENOUS
  Filled 2015-08-14: qty 2

## 2015-08-14 NOTE — Progress Notes (Signed)
[facility].  Questioned about continues Neb treatment still ordered. " I took it off."  Report received and room ready for admit.
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
## 2015-08-14 NOTE — Progress Notes (Addendum)
  Echocardiogram 2D Echocardiogram with Definity has been performed.  Chris SavoyCasey N Lovinia [name] 08/14/2015, 3:43 PM

## 2015-08-14 NOTE — Progress Notes (Signed)
PROGRESS NOTE  Albesa SeenJerome Bryars ZOX:[id number] DOB: 07/01/1966 DOA: 08/13/2015 PCP: Jeanann LewandowskyJEGEDE, OLUGBEMIGA, MD  Brief History 50 year old male with a history of diabetes mellitus, COPD, HIV, hypertension presented with one-week history of increasing shortness of breath, cough with yellow sputum, and wheezing. The patient states that he was exposed to sick baby around Christmas time. Thereafter, the patient had 1-2 days of nausea, vomiting followed by his shortness breath and coughing. Unfortunately, the patient continues to smoke one half pack per day. He has over a 40-pack-year history. He denies any fevers, chills, chest pain, present vomiting, diarrhea, abdominal pain, dysuria, hematuria, rashes. The patient endorses compliance with his inhalers as well as his HIV medications. Upon admission, chest x-ray was noted to have increased interstitial markings. The patient was started on intravenous steroids. Assessment/Plan: Acute on chronic respiratory failure -Secondary to COPD exacerbation although cannot rule out component of cor pulmonale -Patient is normally on 2 L nasal cannula at home -Presently stable on 4L with sat 93-94% -Wean for oxygenation greater than 92% -Echocardiogram COPD exacerbation -Increase intravenous steroids to 60 mg IV every 8 hours -Continue Pulmicort -Continue azithromycin -Continue albuterol and Atrovent nebulizers -Wean oxygen fractionation greater than 92% HIV  -Continue Genvoya and prezista -06/18/2015 CD4 count 340/12%  -06/18/2015 HIV RNA <20 -Repeat CD4 count  -Continue dapsone  Diabetes mellitus type 2 -07/18/2015 hemoglobin A1c 6.0 -Anticipate elevated CBGs secondary to intravenous steroids -Add pre-meal NovoLog -Restart Lantus Hypertension -Continue lisinopril  Family Communication:   Wife updated at beside Disposition Plan:   Home when medically stable       Procedures/Studies: Dg Chest 2 View  08/14/2015  CLINICAL DATA:  Acute onset of  shortness of breath. Initial encounter. EXAM: CHEST  2 VIEW COMPARISON:  Chest radiograph performed 07/16/2015 FINDINGS: The lungs are well-aerated. Vascular congestion is noted. Mildly increased interstitial markings may reflect minimal interstitial edema. There is no evidence of pleural effusion or pneumothorax. The heart is normal in size; the mediastinal contour is within normal limits. No acute osseous abnormalities are seen. IMPRESSION: Vascular congestion noted. Mildly increased interstitial markings may reflect minimal interstitial edema. Electronically Signed   By: Roanna RaiderJeffery  Chang M.D.   On: 08/14/2015 00:45   Dg Chest 2 View  07/16/2015  CLINICAL DATA:  Cough, shortness of breath for 3 days. Back and shoulder pain. EXAM: CHEST  2 VIEW COMPARISON:  01/05/2015 FINDINGS: No confluent airspace opacities. Heart is normal size. No effusions or acute bony abnormality. IMPRESSION: No active cardiopulmonary disease. Electronically Signed   By: Charlett NoseKevin  Dover M.D.   On: 07/16/2015 12:25         Subjective:  patient says he is breathing about 25% better but continues to have cough with yellow sputum. Denies any fevers, chills, chest pain, nausea, vomiting, diarrhea, abdominal pain, dysuria, hematuria. Denies any headache or neck pain. No rashes.  Objective: Filed Vitals:   08/14/15 0200 08/14/15 0230 08/14/15 0330 08/14/15 0801  BP: 109/86 132/82 141/66 124/66  Pulse: 99 109 104 87  Temp:   98.4 F (36.9 C) 98.1 F (36.7 C)  TempSrc:   Oral Oral  Resp:  20 21 19   Height:   5\' 9"  (1.753 m)   Weight:   124 kg (273 lb 5.9 oz)   SpO2: 92% 94% 90% 98%    Intake/Output Summary (Last 24 hours) at 08/14/15 0838 Last data filed at 08/14/15 [id number]  Gross per 24 hour  Intake  240 ml  Output      0 ml  Net    240 ml   Weight change:  Exam:   General:  Pt is alert, follows commands appropriately, not in acute distress  HEENT: No icterus, No thrush, No neck mass, [place]/AT  Cardiovascular: RRR,  S1/S2, no rubs, no gallops  Respiratory: bibasilar crackles with expiratory wheeze.   Abdomen: Soft/+BS, non tender, non distended, no guarding  Extremities: 1+LE edema, No lymphangitis, No petechiae, No rashes, no synovitis  Data Reviewed: Basic Metabolic Panel:  Recent Labs Lab 08/14/15 0015 08/14/15 0537  NA 136 134*  K 4.7 4.6  CL 102 98*  CO2 29 28  GLUCOSE 171* 362*  BUN 9 8  CREATININE 0.81 0.85  CALCIUM 8.3* 8.1*   Liver Function Tests:  Recent Labs Lab 08/14/15 0537  AST 47*  ALT 71*  ALKPHOS 37*  BILITOT 0.2*  PROT 7.9  ALBUMIN 3.1*   No results for input(s): LIPASE, AMYLASE in the last 168 hours. No results for input(s): AMMONIA in the last 168 hours. CBC:  Recent Labs Lab 08/14/15 0015 08/14/15 0537  WBC 6.4 5.8  NEUTROABS 3.2 5.2  HGB 13.3 13.0  HCT 43.3 42.6  MCV 94.5 95.5  PLT 156 144*   Cardiac Enzymes: No results for input(s): CKTOTAL, CKMB, CKMBINDEX, TROPONINI in the last 168 hours. BNP: Invalid input(s): POCBNP CBG:  Recent Labs Lab 08/14/15 0324  GLUCAP 169*    No results found for this or any previous visit (from the past 240 hour(s)).   Scheduled Meds: . antiseptic oral rinse  7 mL Mouth Rinse BID  . [START ON 08/15/2015] azithromycin  500 mg Intravenous Q24H  . budesonide (PULMICORT) nebulizer solution  0.25 mg Nebulization BID  . dapsone  100 mg Oral Daily  . Darunavir Ethanolate  800 mg Oral Q breakfast  . elvitegravir-cobicistat-emtricitabine-tenofovir  1 tablet Oral Q breakfast  . enoxaparin (LOVENOX) injection  60 mg Subcutaneous Q24H  . insulin aspart  0-15 Units Subcutaneous TID WC  . insulin glargine  10 Units Subcutaneous QHS  . ipratropium-albuterol  3 mL Nebulization Q4H  . lisinopril  10 mg Oral Daily  . methylPREDNISolone (SOLU-MEDROL) injection  40 mg Intravenous Daily  . sodium chloride  3 mL Intravenous Q12H   Continuous Infusions:    Jameika Kinn, DO  [facility] Pager [phone]  If  7PM-7AM, please contact night-coverage [email or web address] Password [email or web address] 08/14/2015, 8:38 AM   LOS: 0 days

## 2015-08-15 LAB — GLUCOSE, CAPILLARY
GLUCOSE-CAPILLARY: 321 mg/dL — AB (ref 65–99)
Glucose-Capillary: 282 mg/dL — ABNORMAL HIGH (ref 65–99)

## 2015-08-15 LAB — COMPREHENSIVE METABOLIC PANEL
ALBUMIN: 3.3 g/dL — AB (ref 3.5–5.0)
ALT: 83 U/L — AB (ref 17–63)
AST: 48 U/L — AB (ref 15–41)
Alkaline Phosphatase: 38 U/L (ref 38–126)
Anion gap: 5 (ref 5–15)
BUN: 11 mg/dL (ref 6–20)
CHLORIDE: 98 mmol/L — AB (ref 101–111)
CO2: 31 mmol/L (ref 22–32)
CREATININE: 0.65 mg/dL (ref 0.61–1.24)
Calcium: 9 mg/dL (ref 8.9–10.3)
GFR calc Af Amer: >60 mL/min (ref >60)
GLUCOSE: 247 mg/dL — AB (ref 65–99)
POTASSIUM: 5.3 mmol/L — AB (ref 3.5–5.1)
Sodium: 134 mmol/L — ABNORMAL LOW (ref 135–145)
Total Bilirubin: 0.7 mg/dL (ref 0.3–1.2)
Total Protein: 8.4 g/dL — ABNORMAL HIGH (ref 6.5–8.1)

## 2015-08-15 LAB — T-HELPER CELLS (CD4) COUNT (NOT AT ARMC)
CD4 T CELL ABS: 90 /uL — AB (ref 400–2700)
CD4 T CELL HELPER: 9 % — AB (ref 33–55)

## 2015-08-15 MED ORDER — AZITHROMYCIN 500 MG PO TABS: 500.0000 mg | ORAL_TABLET | Freq: Every day | ORAL | Status: DC

## 2015-08-15 MED ORDER — PREDNISONE 20 MG PO TABS: 60.0000 mg | ORAL_TABLET | Freq: Every day | ORAL | Status: DC

## 2015-08-15 MED ORDER — SODIUM POLYSTYRENE SULFONATE 15 GM/60ML PO SUSP
30.0000 g | Freq: Once | ORAL | Status: AC
  Administered 2015-08-15: 30 g via ORAL
  Filled 2015-08-15: qty 120

## 2015-08-15 MED ORDER — PREDNISONE 50 MG PO TABS: 60.0000 mg | ORAL_TABLET | Freq: Every day | ORAL | Status: DC

## 2015-08-15 NOTE — Progress Notes (Signed)
Chris [name] to be D/C'd Home per MD order.  Discussed prescriptions and follow up appointments with the patient. Prescriptions given to patient, medication list explained in detail. Pt verbalized understanding.    Medication List    STOP taking these medications        acetaminophen-codeine 300-30 MG tablet  Commonly known as:  TYLENOL #3     amoxicillin-clavulanate 1000-62.5 MG 12 hr tablet  Commonly known as:  AUGMENTIN XR     doxycycline 100 MG EC tablet  Commonly known as:  DORYX     lisinopril 10 MG tablet  Commonly known as:  PRINIVIL      TAKE these medications        ACCU-CHEK AVIVA PLUS w/Device Kit  250 Test up to 4 times daily     albuterol 108 (90 Base) MCG/ACT inhaler  Commonly known as:  PROVENTIL HFA;VENTOLIN HFA  Inhale 2 puffs into the lungs every 4 (four) hours as needed for wheezing or shortness of breath (((PLAN B))).     albuterol (5 MG/ML) 0.5% nebulizer solution  Commonly known as:  PROVENTIL  Take 0.5 mLs (2.5 mg total) by nebulization every 4 (four) hours as needed for wheezing or shortness of breath (((PLAN C))).     azithromycin 500 MG tablet  Commonly known as:  ZITHROMAX  Take 1 tablet (500 mg total) by mouth daily.  Start taking on:  08/16/2015     beclomethasone 80 MCG/ACT inhaler  Commonly known as:  QVAR  Inhale 2 puffs into the lungs 2 (two) times daily.     dapsone 100 MG tablet  TAKE 1 TABLET BY MOUTH DAILY     glucose blood test strip  Use as instructed     insulin glargine 100 UNIT/ML injection  Commonly known as:  LANTUS  Inject 0.1 mLs (10 Units total) into the skin at bedtime.     Insulin Pen Needle 32G X 5 MM Misc  Use as directed     metFORMIN 500 MG tablet  Commonly known as:  GLUCOPHAGE  Take 1 tablet (500 mg total) by mouth 2 (two) times daily with a meal.     predniSONE 20 MG tablet  Commonly known as:  DELTASONE  Take 3 tablets (60 mg total) by mouth daily with breakfast. On 08/16/15 and decrease by one tablet  daily  Start taking on:  08/16/2015     PREZISTA 800 MG tablet  Generic drug:  Darunavir Ethanolate  TAKE 1 TABLET BY MOUTH DAILY WITH BREAKFAST     sildenafil 50 MG tablet  Commonly known as:  VIAGRA  Take 0.5 tablets (25 mg total) by mouth daily as needed for erectile dysfunction.     STRIBILD 150-150-200-300 MG Tabs tablet  Generic drug:  elvitegravir-cobicistat-emtricitabine-tenofovir  TAKE 1 TABLET BY MOUTH DAILY WITH BREAKFAST     tiotropium 18 MCG inhalation capsule  Commonly known as:  SPIRIVA HANDIHALER  Place 1 capsule (18 mcg total) into inhaler and inhale every morning.        Filed Vitals:   08/15/15 0903 08/15/15 1210  BP: 102/75 132/76  Pulse: 93 80  Temp: 98.5 F (36.9 C) 98.5 F (36.9 C)  Resp: 20 20    Skin clean, dry and intact without evidence of skin break down, no evidence of skin tears noted. IV catheter discontinued intact. Site without signs and symptoms of complications. Dressing and pressure applied. Pt denies pain at this time. No complaints noted.  An After Visit Summary was  printed and given to the patient. Patient escorted via [place], and D/C home via private auto.  Carole Civil RN [facility] Phone [phone]

## 2015-08-15 NOTE — Progress Notes (Signed)
Inpatient Diabetes Program Recommendations  AACE/[name]: New Consensus Statement on Inpatient Glycemic Control (2015)  Target Ranges:  Prepandial:   less than 140 mg/dL      Peak postprandial:   less than 180 mg/dL (1-2 hours)      Critically ill patients:  140 - 180 mg/dL   Results for Chris [name], Chris [name] (MRN [id number]) as of 08/15/2015 15:06  Ref. Range 08/14/2015 11:58 08/14/2015 16:53 08/14/2015 20:17  Glucose-Capillary Latest Ref Range: 65-99 mg/dL 409257 (H) 811155 (H) 914274 (H)    Results for Chris [name], Chris [name] (MRN [id number]) as of 08/15/2015 15:06  Ref. Range 08/15/2015 07:52 08/15/2015 12:21  Glucose-Capillary Latest Ref Range: 65-99 mg/dL 086282 (H) 578321 (H)     Admit SOB  History: DM, COPD, HIV  Home DM Meds: Lantus 10 units QHS        Metformin 500 mg bid  Current Insulin Orders: Lantus 10 units QHS       Novolog Moderate SSI (0-15 units) TID AC      Novolog 4 units tidwc     MD- Note patient to get last dose IV Solumedrol tonight at 6pm.  Will start Prednisone 60 mg daily tomorrow (01/05).  If patient continues to have both elevated fasting and postprandial glucose levels, please consider the following in-hospital adjustments:  1. Increase Lantus to 15 units QHS  2. Increase Novolog Meal Coverage to 5 units tidwc     --Will follow patient during hospitalization--  Ambrose FinlandJeannine Johnston Carolos Fecher RN, MSN, CDE Diabetes Coordinator Inpatient Glycemic Control Team Team Pager: [phone] (8a-5p)

## 2015-08-15 NOTE — Hospital Discharge Follow-Up (Signed)
[facility] at [facility]:  This Case Manager case conferenced with Jasmine Pang, RN CM and it was determined patient may benefit from the [facility].  Met with patient and provided information about the [facility] at [facility] and [facility]. Patient indicated he was about to be discharged and this was not a good time for him to talk. He requested this Case Manager follow-up with him tomorrow. Patient's phone number is [phone].

## 2015-08-15 NOTE — Discharge Summary (Signed)
Physician Discharge Summary  Chris [name] [facility]:[id number] DOB: Jul 08, 1966 DOA: 08/13/2015  PCP: Angelica Chessman, MD  Admit date: 08/13/2015 Discharge date: 08/15/2015  Recommendations for Outpatient Follow-up:  1. Pt will need to follow up with PCP in 2 weeks post discharge 2. Please obtain BMP on 08/17/15 Discharge Diagnoses:  Acute on chronic respiratory failure with hypoxia -Secondary to COPD exacerbation -Patient is normally on 2 L nasal cannula at home -Presently stable on 4L with sat 93-94%-->weaned to baseline 2 L [place]  -Wean for oxygenation greater than 92% -Echocardiogram--EF 60-65%, normal RV, no WMA Acute COPD exacerbation -Increase intravenous steroids to 60 mg IV every 8 hours -Continue Pulmicort -Continue azithromycin--plan 3 additional days -Continue albuterol and Atrovent nebulizers -Although the patient improved clinically, he still had wheezing on exam;  however, the patient was adamant about being discharged home. He was able to ablate around the room with little difficulty.  The patient will be discharged with prednisone taper Hyperkalemia -pt given kayexalate x 1 -Patient refused to stay for follow-up lab to recheck potassium -Repeat potassium on 08/16/2015 or 08/17/2015 HIV  -Continue Genvoya and prezista -06/18/2015 CD4 count 340/12%  -06/18/2015 HIV RNA <20 -1/3/17Repeat CD4 count--90/9% -Continue dapsone  Diabetes mellitus type 2 -07/18/2015 hemoglobin A1c 6.0 -Anticipate elevated CBGs secondary to intravenous steroids -Add pre-meal NovoLog -Restart Lantus 10 units at bedtime Hypertension -Continue lisinopril    Discharge Condition: stable  Disposition: home  Diet:carb modified Wt Readings from Last 3 Encounters:  08/14/15 124.1 kg (273 lb 9.5 oz)  07/18/15 127.461 kg (281 lb)  07/16/15 126.752 kg (279 lb 7 oz)    History of present illness:  50 year old male with a history of diabetes mellitus, COPD, HIV, hypertension presented with  one-week history of increasing shortness of breath, cough with yellow sputum, and wheezing. The patient states that he was exposed to sick baby around Christmas time. Thereafter, the patient had 1-2 days of nausea, vomiting followed by his shortness breath and coughing. Unfortunately, the patient continues to smoke one half pack per day. He has over a 40-pack-year history. He denies any fevers, chills, chest pain, present vomiting, diarrhea, abdominal pain, dysuria, hematuria, rashes. The patient endorses compliance with his inhalers as well as his HIV medications. Upon admission, chest x-ray was noted to have increased interstitial markings. The patient was started on intravenous steroids. The patient experienced good clinical improvement with steroids. However, the patient continued to have significant wheezing on 08/15/15  although the patient had clinical improvement. The patient was adamant about being discharged home. He'll be sent home with prednisone taper and azithromycin for 3 additional days.  Discharge Exam: Filed Vitals:   08/15/15 0903 08/15/15 1210  BP: 102/75 132/76  Pulse: 93 80  Temp: 98.5 F (36.9 C) 98.5 F (36.9 C)  Resp: 20 20   Filed Vitals:   08/15/15 0441 08/15/15 0903 08/15/15 1210 08/15/15 1224  BP:  102/75 132/76   Pulse: 90 93 80   Temp:  98.5 F (36.9 C) 98.5 F (36.9 C)   TempSrc:  Oral Oral   Resp: _0 Height:      Weight:      SpO2:  92% 95% 93%   General: A&O x 3, NAD, pleasant, cooperative Cardiovascular: RRR, no rub, no gallop, no S3 Respiratory: Mild bibasilar wheezing. Bibasilar rales. Good air movement Abdomen:soft, nontender, nondistended, positive bowel sounds Extremities: trace LE edema, No lymphangitis, no petechiae  Discharge Instructions      Discharge Instructions  Diet - low sodium heart healthy    Complete by:  As directed      Increase activity slowly    Complete by:  As directed             Medication List    STOP  taking these medications        acetaminophen-codeine 300-30 MG tablet  Commonly known as:  TYLENOL #3     amoxicillin-clavulanate 1000-62.5 MG 12 hr tablet  Commonly known as:  AUGMENTIN XR     doxycycline 100 MG EC tablet  Commonly known as:  DORYX     lisinopril 10 MG tablet  Commonly known as:  PRINIVIL      TAKE these medications        ACCU-CHEK AVIVA PLUS w/Device Kit  250 Test up to 4 times daily     albuterol 108 (90 Base) MCG/ACT inhaler  Commonly known as:  PROVENTIL HFA;VENTOLIN HFA  Inhale 2 puffs into the lungs every 4 (four) hours as needed for wheezing or shortness of breath (((PLAN B))).     albuterol (5 MG/ML) 0.5% nebulizer solution  Commonly known as:  PROVENTIL  Take 0.5 mLs (2.5 mg total) by nebulization every 4 (four) hours as needed for wheezing or shortness of breath (((PLAN C))).     azithromycin 500 MG tablet  Commonly known as:  ZITHROMAX  Take 1 tablet (500 mg total) by mouth daily.  Start taking on:  08/16/2015     beclomethasone 80 MCG/ACT inhaler  Commonly known as:  QVAR  Inhale 2 puffs into the lungs 2 (two) times daily.     dapsone 100 MG tablet  TAKE 1 TABLET BY MOUTH DAILY     glucose blood test strip  Use as instructed     insulin glargine 100 UNIT/ML injection  Commonly known as:  LANTUS  Inject 0.1 mLs (10 Units total) into the skin at bedtime.     Insulin Pen Needle 32G X 5 MM Misc  Use as directed     metFORMIN 500 MG tablet  Commonly known as:  GLUCOPHAGE  Take 1 tablet (500 mg total) by mouth 2 (two) times daily with a meal.     predniSONE 20 MG tablet  Commonly known as:  DELTASONE  Take 3 tablets (60 mg total) by mouth daily with breakfast. On 08/16/15 and decrease by one tablet daily  Start taking on:  08/16/2015     PREZISTA 800 MG tablet  Generic drug:  Darunavir Ethanolate  TAKE 1 TABLET BY MOUTH DAILY WITH BREAKFAST     sildenafil 50 MG tablet  Commonly known as:  VIAGRA  Take 0.5 tablets (25 mg total) by  mouth daily as needed for erectile dysfunction.     STRIBILD 150-150-200-300 MG Tabs tablet  Generic drug:  elvitegravir-cobicistat-emtricitabine-tenofovir  TAKE 1 TABLET BY MOUTH DAILY WITH BREAKFAST     tiotropium 18 MCG inhalation capsule  Commonly known as:  SPIRIVA HANDIHALER  Place 1 capsule (18 mcg total) into inhaler and inhale every morning.         The results of significant diagnostics from this hospitalization (including imaging, microbiology, ancillary and laboratory) are listed below for reference.    Significant Diagnostic Studies: Dg Chest 2 View  08/14/2015  CLINICAL DATA:  Acute onset of shortness of breath. Initial encounter. EXAM: CHEST  2 VIEW COMPARISON:  Chest radiograph performed 07/16/2015 FINDINGS: The lungs are well-aerated. Vascular congestion is noted. Mildly increased interstitial markings may reflect minimal  interstitial edema. There is no evidence of pleural effusion or pneumothorax. The heart is normal in size; the mediastinal contour is within normal limits. No acute osseous abnormalities are seen. IMPRESSION: Vascular congestion noted. Mildly increased interstitial markings may reflect minimal interstitial edema. Electronically Signed   By: Garald Balding M.D.   On: 08/14/2015 00:45     Microbiology: Recent Results (from the past 240 hour(s))  Culture, blood (Routine X 2) w Reflex to ID Panel     Status: None (Preliminary result)   Collection Time: 08/14/15 12:30 AM  Result Value Ref Range Status   Specimen Description BLOOD LEFT ARM  Final   Special Requests BOTTLES DRAWN AEROBIC AND ANAEROBIC 10ML  Final   Culture NO GROWTH 1 DAY  Final   Report Status PENDING  Incomplete  Culture, blood (Routine X 2) w Reflex to ID Panel     Status: None (Preliminary result)   Collection Time: 08/14/15 12:40 AM  Result Value Ref Range Status   Specimen Description BLOOD LEFT HAND  Final   Special Requests AEROBIC BOTTLE ONLY 10ML  Final   Culture NO GROWTH 1  DAY  Final   Report Status PENDING  Incomplete     Labs: Basic Metabolic Panel:  Recent Labs Lab 08/14/15 0015 08/14/15 0537 08/15/15 0523  NA 136 134* 134*  K 4.7 4.6 5.3*  CL 102 98* 98*  CO2 _0 GLUCOSE 171* 362* 247*  BUN _1 CREATININE 0.81 0.85 0.65  CALCIUM 8.3* 8.1* 9.0   Liver Function Tests:  Recent Labs Lab 08/14/15 0537 08/15/15 0523  AST 47* 48*  ALT 71* 83*  ALKPHOS 37* 38  BILITOT 0.2* 0.7  PROT 7.9 8.4*  ALBUMIN 3.1* 3.3*   No results for input(s): LIPASE, AMYLASE in the last 168 hours. No results for input(s): AMMONIA in the last 168 hours. CBC:  Recent Labs Lab 08/14/15 0015 08/14/15 0537  WBC 6.4 5.8  NEUTROABS 3.2 5.2  HGB 13.3 13.0  HCT 43.3 42.6  MCV 94.5 95.5  PLT 156 144*   Cardiac Enzymes: No results for input(s): CKTOTAL, CKMB, CKMBINDEX, TROPONINI in the last 168 hours. BNP: Invalid input(s): POCBNP CBG:  Recent Labs Lab 08/14/15 1158 08/14/15 1653 08/14/15 2017 08/15/15 0752 08/15/15 1221  GLUCAP 257* 155* 274* 282* 321*    Time coordinating discharge:  Greater than 30 minutes  Signed:  Erie Radu, DO [facility] Pager: [phone] 08/15/2015, 3:48 PM

## 2015-08-16 ENCOUNTER — Telehealth: Payer: Self-pay

## 2015-08-16 NOTE — Telephone Encounter (Signed)
[facility]itional Care Clinic Post-discharge Follow-Up Phone Call:  Date of Discharge: 08/15/15 Principal Discharge Diagnosis(es): Acute on chronic respiratory failure with hypoxia, acute COPD exacerbation Call Completed: Yes                   With Whom: Patient   This Case Manager placed call to patient to discuss the [facility]itional Care Clinic and to inform patient of the follow-up and medical management provided with the Transitional Care Clinic.  [facility] and an appointment was scheduled for 08/20/15 at 1515 with Dr. Venetia NightAmao. Patient appreciative of appointment and indicated he would have transportation to his appointment. He indicated he normally drives or has a friend provide transportation to medical appointments.   Please check all that apply:  X  Patient is knowledgeable of his/her condition(s) and/or treatment. X  Patient is caring for self at home.  ? Patient is receiving assist at home from family and/or caregiver. Family and/or caregiver is knowledgeable of patient's condition(s) and/or treatment. ? Patient is receiving home health services. If so, name of agency.     Medication Reconciliation:  X  Patient obtained all discharge medications-Yes. Patient's discharge medications thoroughly reviewed, and patient indicated he obtained all discharge medications. Discussed importance of medication compliance.  Patient informed of medications that he was to stop taking per discharge summary: tylenol #3, Augmentin XR, doxycycline, lisinopril. Patient indicated he had already taken lisinopril today but would stop it starting 08/17/15.[facility] [address]. He indicated he sometimes has difficulty affording medication copays (patient has Medicare and Medicaid).  This Case Manager reminded patient that [facility] and informed patient of pharmacy  resources. Patient verbalized understanding.   Activities of Daily Living:  X  Independent-Patient is independent with daily activities; however, Patient on home oxygen (2L per nasal cannula). Oxygen supplier is [facility]. Patient also indicated he has a home nebulizer. ? Needs assist  ? [facility]   Community resources in place for patient:  X  None  ? Home Health/Home DME ? Assisted Living ? Support Group                Questions/Concerns discussed: [facility]itional [facility] Clinic appointment scheduled for 08/20/15 at 1515 with Dr. Venetia NightAmao. Inquired about patient's status. Patient indicated he was doing well and indicated he had only experienced shortness of breath when he tried to do "too much too soon."  Reminded patient to increase activity slowly. Also reminded patient to eat a low sodium heart healthy diet. Patient verbalized understanding. No additional needs/concerns identified.

## 2015-08-17 ENCOUNTER — Telehealth: Payer: Self-pay

## 2015-08-17 NOTE — Telephone Encounter (Signed)
This Case Manager placed call to patient to remind him of [facility] appointment on 08/20/15 at 1515. Unable to reach patient; voicemail left requesting return call.

## 2015-08-19 LAB — CULTURE, BLOOD (ROUTINE X 2)
CULTURE: NO GROWTH
CULTURE: NO GROWTH

## 2015-08-20 ENCOUNTER — Telehealth: Payer: Self-pay

## 2015-08-20 ENCOUNTER — Inpatient Hospital Stay: Payer: Medicare Other | Admitting: Family Medicine

## 2015-08-20 NOTE — Telephone Encounter (Signed)
Call placed to the patient to check on his status and to [facility].  He said that he is doing " fine."  An appointment was scheduled for tomorrow, 08/21/15 @ 1145. Instructed him to bring his medications for the doctor to review with him. He said that he would bring them and also reported that he has transportation to the clinic.  No problems/concerns voiced.

## 2015-08-21 ENCOUNTER — Other Ambulatory Visit: Payer: Self-pay | Admitting: Internal Medicine

## 2015-08-21 ENCOUNTER — Encounter: Payer: Self-pay | Admitting: Family Medicine

## 2015-08-21 ENCOUNTER — Ambulatory Visit: Payer: Medicare Other | Attending: Family Medicine | Admitting: Family Medicine

## 2015-08-21 VITALS — BP 130/75 | HR 98 | Temp 97.8°F | Resp 15 | Ht 69.0 in | Wt 275.2 lb

## 2015-08-21 DIAGNOSIS — J441 Chronic obstructive pulmonary disease with (acute) exacerbation: Secondary | ICD-10-CM | POA: Insufficient documentation

## 2015-08-21 DIAGNOSIS — E875 Hyperkalemia: Secondary | ICD-10-CM | POA: Insufficient documentation

## 2015-08-21 DIAGNOSIS — E1165 Type 2 diabetes mellitus with hyperglycemia: Secondary | ICD-10-CM | POA: Diagnosis not present

## 2015-08-21 DIAGNOSIS — Z794 Long term (current) use of insulin: Secondary | ICD-10-CM | POA: Diagnosis not present

## 2015-08-21 DIAGNOSIS — M549 Dorsalgia, unspecified: Secondary | ICD-10-CM | POA: Diagnosis not present

## 2015-08-21 DIAGNOSIS — M1711 Unilateral primary osteoarthritis, right knee: Secondary | ICD-10-CM

## 2015-08-21 DIAGNOSIS — Z79899 Other long term (current) drug therapy: Secondary | ICD-10-CM | POA: Insufficient documentation

## 2015-08-21 DIAGNOSIS — R358 Other polyuria: Secondary | ICD-10-CM | POA: Diagnosis not present

## 2015-08-21 DIAGNOSIS — Z7984 Long term (current) use of oral hypoglycemic drugs: Secondary | ICD-10-CM | POA: Insufficient documentation

## 2015-08-21 DIAGNOSIS — B20 Human immunodeficiency virus [HIV] disease: Secondary | ICD-10-CM | POA: Insufficient documentation

## 2015-08-21 DIAGNOSIS — Z720 Tobacco use: Secondary | ICD-10-CM

## 2015-08-21 DIAGNOSIS — F17200 Nicotine dependence, unspecified, uncomplicated: Secondary | ICD-10-CM

## 2015-08-21 DIAGNOSIS — F17210 Nicotine dependence, cigarettes, uncomplicated: Secondary | ICD-10-CM | POA: Insufficient documentation

## 2015-08-21 DIAGNOSIS — R5383 Other fatigue: Secondary | ICD-10-CM | POA: Diagnosis not present

## 2015-08-21 DIAGNOSIS — J449 Chronic obstructive pulmonary disease, unspecified: Secondary | ICD-10-CM

## 2015-08-21 DIAGNOSIS — E11628 Type 2 diabetes mellitus with other skin complications: Secondary | ICD-10-CM | POA: Diagnosis not present

## 2015-08-21 DIAGNOSIS — I10 Essential (primary) hypertension: Secondary | ICD-10-CM | POA: Diagnosis not present

## 2015-08-21 DIAGNOSIS — R3589 Other polyuria: Secondary | ICD-10-CM

## 2015-08-21 LAB — COMPREHENSIVE METABOLIC PANEL
ALT: 35 U/L (ref 9–46)
AST: 12 U/L (ref 10–40)
Albumin: 3.6 g/dL (ref 3.6–5.1)
Alkaline Phosphatase: 38 U/L — ABNORMAL LOW (ref 40–115)
BILIRUBIN TOTAL: 0.8 mg/dL (ref 0.2–1.2)
BUN: 15 mg/dL (ref 7–25)
CALCIUM: 9.4 mg/dL (ref 8.6–10.3)
CO2: 30 mmol/L (ref 20–31)
Chloride: 97 mmol/L — ABNORMAL LOW (ref 98–110)
Creat: 0.88 mg/dL (ref 0.60–1.35)
GLUCOSE: 383 mg/dL — AB (ref 65–99)
Potassium: 5.1 mmol/L (ref 3.5–5.3)
SODIUM: 136 mmol/L (ref 135–146)
Total Protein: 7.2 g/dL (ref 6.1–8.1)

## 2015-08-21 LAB — POCT GLYCOSYLATED HEMOGLOBIN (HGB A1C): HEMOGLOBIN A1C: 7.5

## 2015-08-21 LAB — GLUCOSE, POCT (MANUAL RESULT ENTRY): POC GLUCOSE: 367 mg/dL — AB (ref 70–99)

## 2015-08-21 LAB — TSH: TSH: 1.507 u[IU]/mL (ref 0.350–4.500)

## 2015-08-21 MED ORDER — INSULIN GLARGINE 100 UNIT/ML ~~LOC~~ SOLN: 15.0000 [IU] | Freq: Every day | SUBCUTANEOUS | Status: DC

## 2015-08-21 MED ORDER — "INSULIN SYRINGE-NEEDLE U-100 31G X 15/64"" 0.5 ML MISC": 1.0000 | Freq: Every day | Status: DC

## 2015-08-21 MED ORDER — LISINOPRIL-HYDROCHLOROTHIAZIDE 10-12.5 MG PO TABS: 1.0000 | ORAL_TABLET | Freq: Every day | ORAL | Status: DC

## 2015-08-21 MED ORDER — ACETAMINOPHEN-CODEINE #3 300-30 MG PO TABS: 1.0000 | ORAL_TABLET | Freq: Three times a day (TID) | ORAL | Status: DC | PRN
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
## 2015-08-21 NOTE — Patient Instructions (Signed)
Fatigue  Fatigue is feeling tired all of the time, a lack of energy, or a lack of motivation. Occasional or mild fatigue is often a normal response to activity or life in general. However, long-lasting (chronic) or extreme fatigue may indicate an underlying medical condition.  HOME CARE INSTRUCTIONS   Watch your fatigue for any changes. The following actions may help to lessen any discomfort you are feeling:  · Talk to your health care provider about how much sleep you need each night. Try to get the required amount every night.  · Take medicines only as directed by your health care provider.  · Eat a healthy and nutritious diet. Ask your health care provider if you need help changing your diet.  · Drink enough fluid to keep your urine clear or pale yellow.  · Practice ways of relaxing, such as yoga, meditation, massage therapy, or acupuncture.  · Exercise regularly.    · Change situations that cause you stress. Try to keep your work and personal routine reasonable.  · Do not abuse illegal drugs.  · Limit alcohol intake to no more than 1 drink per day for nonpregnant women and 2 drinks per day for men. One drink equals 12 ounces of beer, 5 ounces of wine, or 1½ ounces of hard liquor.  · Take a multivitamin, if directed by your health care provider.  SEEK MEDICAL CARE IF:   · Your fatigue does not get better.  · You have a fever.    · You have unintentional weight loss or gain.  · You have headaches.    · You have difficulty:      Falling asleep.    Sleeping throughout the night.  · You feel angry, guilty, anxious, or sad.     · You are unable to have a bowel movement (constipation).    · You skin is dry.     · Your legs or another part of your body is swollen.    SEEK IMMEDIATE MEDICAL CARE IF:   · You feel confused.    · Your vision is blurry.  · You feel faint or pass out.    · You have a severe headache.    · You have severe abdominal, pelvic, or back pain.    · You have chest pain, shortness of breath, or an  irregular or fast heartbeat.    · You are unable to urinate or you urinate less than normal.    · You develop abnormal bleeding, such as bleeding from the rectum, vagina, nose, lungs, or nipples.  · You vomit blood.     · You have thoughts about harming yourself or committing suicide.    · You are worried that you might harm someone else.       This information is not intended to replace advice given to you by your health care provider. Make sure you discuss any questions you have with your health care provider.     Document Released: 05/25/2007 Document Revised: 08/18/2014 Document Reviewed: 11/29/2013  Elsevier Interactive Patient Education ©2016 [employer]

## 2015-08-21 NOTE — Progress Notes (Signed)
Patient here for follow up after recent hospitalization He reports fatigue and chronic lower back pain He also reports polyuria He has all medications with him He is asking for refill on his Tylenol #3

## 2015-08-27 ENCOUNTER — Encounter: Payer: Self-pay | Admitting: Clinical

## 2015-08-27 ENCOUNTER — Telehealth: Payer: Self-pay | Admitting: *Deleted

## 2015-08-27 NOTE — Telephone Encounter (Signed)
Spoke to patient and verified name and date of birth and gave results to patient he verbalized understanding.

## 2015-08-27 NOTE — Progress Notes (Signed)
A completed medical release form and physician's verification has been faxed to [employer] at [phone], in order for patient to receive the Medical Alert program on his electric service account.

## 2015-08-27 NOTE — Telephone Encounter (Signed)
-----   Message from Jaclyn ShaggyEnobong Amao, MD sent at 08/22/2015  8:23 AM EST ----- Thyroid is normal, potassium has normalized; we will repeat this again at his next visit

## 2015-08-30 ENCOUNTER — Telehealth: Payer: Self-pay

## 2015-08-30 NOTE — Telephone Encounter (Signed)
This Case Manager placed call to patient to check status and to remind patient of upcoming appointment on 09/04/15 at 0900 with Dr. Venetia Night.  Call placed to #[phone]; unable to reach patient.  Voicemail left requesting return call.

## 2015-09-03 ENCOUNTER — Telehealth: Payer: Self-pay

## 2015-09-03 ENCOUNTER — Inpatient Hospital Stay: Payer: Medicare Other | Admitting: Internal Medicine

## 2015-09-03 NOTE — Telephone Encounter (Signed)
Call placed to the patient to remind him of his appointment at the [facility] tomorrow, 09/04/15 @ 0900.  He said that he would be at the appointment and noted that he  has transportation to the clinic.  Instructed him to bring his medications with him to his appointment.  He said that he has all of his medications and has been taking them as ordered and he would bring them with him. He noted that he does not feel that he has his usual energy and then noted that he is not using his O2 as ordered. Instructed him about the importance of using the O2 and he stated that he would put it on. He noted that he was with his wife at a medical appointment when this CM called.  He said that he had the portable O2 with him. He reported that he did not take his blood sugar this morning but would take it when he gets home. Instructed him to bring his blood sugar log with him to his appointment and he stated that he would.  No other problems/concerns reported.

## 2015-09-04 ENCOUNTER — Ambulatory Visit: Payer: Medicare Other | Attending: Family Medicine | Admitting: Family Medicine

## 2015-09-04 ENCOUNTER — Encounter: Payer: Self-pay | Admitting: Family Medicine

## 2015-09-04 VITALS — BP 107/73 | HR 98 | Temp 97.8°F | Resp 15 | Ht 69.0 in | Wt 273.2 lb

## 2015-09-04 DIAGNOSIS — Z794 Long term (current) use of insulin: Secondary | ICD-10-CM

## 2015-09-04 DIAGNOSIS — J449 Chronic obstructive pulmonary disease, unspecified: Secondary | ICD-10-CM

## 2015-09-04 DIAGNOSIS — M549 Dorsalgia, unspecified: Secondary | ICD-10-CM | POA: Diagnosis not present

## 2015-09-04 DIAGNOSIS — E11628 Type 2 diabetes mellitus with other skin complications: Secondary | ICD-10-CM

## 2015-09-04 DIAGNOSIS — E1165 Type 2 diabetes mellitus with hyperglycemia: Secondary | ICD-10-CM | POA: Insufficient documentation

## 2015-09-04 DIAGNOSIS — F17200 Nicotine dependence, unspecified, uncomplicated: Secondary | ICD-10-CM

## 2015-09-04 DIAGNOSIS — E118 Type 2 diabetes mellitus with unspecified complications: Secondary | ICD-10-CM

## 2015-09-04 DIAGNOSIS — B20 Human immunodeficiency virus [HIV] disease: Secondary | ICD-10-CM | POA: Diagnosis not present

## 2015-09-04 DIAGNOSIS — J441 Chronic obstructive pulmonary disease with (acute) exacerbation: Secondary | ICD-10-CM | POA: Diagnosis not present

## 2015-09-04 DIAGNOSIS — E669 Obesity, unspecified: Secondary | ICD-10-CM | POA: Diagnosis not present

## 2015-09-04 DIAGNOSIS — E875 Hyperkalemia: Secondary | ICD-10-CM | POA: Diagnosis not present

## 2015-09-04 DIAGNOSIS — M1711 Unilateral primary osteoarthritis, right knee: Secondary | ICD-10-CM

## 2015-09-04 DIAGNOSIS — I10 Essential (primary) hypertension: Secondary | ICD-10-CM | POA: Diagnosis not present

## 2015-09-04 DIAGNOSIS — F17210 Nicotine dependence, cigarettes, uncomplicated: Secondary | ICD-10-CM | POA: Insufficient documentation

## 2015-09-04 DIAGNOSIS — R490 Dysphonia: Secondary | ICD-10-CM | POA: Diagnosis not present

## 2015-09-04 DIAGNOSIS — Z720 Tobacco use: Secondary | ICD-10-CM

## 2015-09-04 LAB — BASIC METABOLIC PANEL
BUN: 9 mg/dL (ref 7–25)
CO2: 32 mmol/L — ABNORMAL HIGH (ref 20–31)
CREATININE: 0.87 mg/dL (ref 0.60–1.35)
Calcium: 8.9 mg/dL (ref 8.6–10.3)
Chloride: 98 mmol/L (ref 98–110)
Glucose, Bld: 286 mg/dL — ABNORMAL HIGH (ref 65–99)
POTASSIUM: 5 mmol/L (ref 3.5–5.3)
Sodium: 134 mmol/L — ABNORMAL LOW (ref 135–146)

## 2015-09-04 LAB — GLUCOSE, POCT (MANUAL RESULT ENTRY): POC GLUCOSE: 309 mg/dL — AB (ref 70–99)

## 2015-09-04 MED ORDER — INSULIN GLARGINE 100 UNIT/ML ~~LOC~~ SOLN: 20.0000 [IU] | Freq: Every day | SUBCUTANEOUS | Status: DC

## 2015-09-04 MED ORDER — CETIRIZINE HCL 10 MG PO TABS: 10.0000 mg | ORAL_TABLET | Freq: Every day | ORAL | Status: DC
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
## 2015-09-04 NOTE — Progress Notes (Signed)
Patient here for follow up He is wearing his nasal cannula with O2 at 2L He reports no pain and says the Tylenol 3 is helping his back and knee pain He report taking 10 units of Lantus at night instead of 15 because the 15 "makes me feel bad in the morning." He took his metformin only this am States he has ID appointment next month He has a form from [facility] for MD to look at

## 2015-09-04 NOTE — Patient Instructions (Signed)
Chronic Obstructive Pulmonary Disease Chronic obstructive pulmonary disease (COPD) is a common lung condition in which airflow from the lungs is limited. COPD is a general term that can be used to describe many different lung problems that limit airflow, including both chronic bronchitis and emphysema. If you have COPD, your lung function will probably never return to normal, but there are measures you can take to improve lung function and make yourself feel better. CAUSES   Smoking (common).  Exposure to secondhand smoke.  Genetic problems.  Chronic inflammatory lung diseases or recurrent infections. SYMPTOMS  Shortness of breath, especially with physical activity.  Deep, persistent (chronic) cough with a large amount of thick mucus.  Wheezing.  Rapid breaths (tachypnea).  Gray or bluish discoloration (cyanosis) of the skin, especially in your fingers, toes, or lips.  Fatigue.  Weight loss.  Frequent infections or episodes when breathing symptoms become much worse (exacerbations).  Chest tightness. DIAGNOSIS Your health care provider will take a medical history and perform a physical examination to diagnose COPD. Additional tests for COPD may include:  Lung (pulmonary) function tests.  Chest X-ray.  CT scan.  Blood tests. TREATMENT  Treatment for COPD may include:  Inhaler and nebulizer medicines. These help manage the symptoms of COPD and make your breathing more comfortable.  Supplemental oxygen. Supplemental oxygen is only helpful if you have a low oxygen level in your blood.  Exercise and physical activity. These are beneficial for nearly all people with COPD.  Lung surgery or transplant.  Nutrition therapy to gain weight, if you are underweight.  Pulmonary rehabilitation. This may involve working with a team of health care providers and specialists, such as respiratory, occupational, and physical therapists. HOME CARE INSTRUCTIONS  Take all medicines  (inhaled or pills) as directed by your health care provider.  Avoid over-the-counter medicines or cough syrups that dry up your airway (such as antihistamines) and slow down the elimination of secretions unless instructed otherwise by your health care provider.  If you are a smoker, the most important thing that you can do is stop smoking. Continuing to smoke will cause further lung damage and breathing trouble. Ask your health care provider for help with quitting smoking. He or she can direct you to community resources or hospitals that provide support.  Avoid exposure to irritants such as smoke, chemicals, and fumes that aggravate your breathing.  Use oxygen therapy and pulmonary rehabilitation if directed by your health care provider. If you require home oxygen therapy, ask your health care provider whether you should purchase a pulse oximeter to measure your oxygen level at home.  Avoid contact with individuals who have a contagious illness.  Avoid extreme temperature and humidity changes.  Eat healthy foods. Eating smaller, more frequent meals and resting before meals may help you maintain your strength.  Stay active, but balance activity with periods of rest. Exercise and physical activity will help you maintain your ability to do things you want to do.  Preventing infection and hospitalization is very important when you have COPD. Make sure to receive all the vaccines your health care provider recommends, especially the pneumococcal and influenza vaccines. Ask your health care provider whether you need a pneumonia vaccine.  Learn and use relaxation techniques to manage stress.  Learn and use controlled breathing techniques as directed by your health care provider. Controlled breathing techniques include:  Pursed lip breathing. Start by breathing in (inhaling) through your nose for 1 second. Then, purse your lips as if you were   going to whistle and breathe out (exhale) through the  pursed lips for 2 seconds.  Diaphragmatic breathing. Start by putting one hand on your abdomen just above your waist. Inhale slowly through your nose. The hand on your abdomen should move out. Then purse your lips and exhale slowly. You should be able to feel the hand on your abdomen moving in as you exhale.  Learn and use controlled coughing to clear mucus from your lungs. Controlled coughing is a series of short, progressive coughs. The steps of controlled coughing are: 1. Lean your head slightly forward. 2. Breathe in deeply using diaphragmatic breathing. 3. Try to hold your breath for 3 seconds. 4. Keep your mouth slightly open while coughing twice. 5. Spit any mucus out into a tissue. 6. Rest and repeat the steps once or twice as needed. SEEK MEDICAL CARE IF:  You are coughing up more mucus than usual.  There is a change in the color or thickness of your mucus.  Your breathing is more labored than usual.  Your breathing is faster than usual. SEEK IMMEDIATE MEDICAL CARE IF:  You have shortness of breath while you are resting.  You have shortness of breath that prevents you from:  Being able to talk.  Performing your usual physical activities.  You have chest pain lasting longer than 5 minutes.  Your skin color is more cyanotic than usual.  You measure low oxygen saturations for longer than 5 minutes with a pulse oximeter. MAKE SURE YOU:  Understand these instructions.  Will watch your condition.  Will get help right away if you are not doing well or get worse.   This information is not intended to replace advice given to you by your health care provider. Make sure you discuss any questions you have with your health care provider.   Document Released: 05/07/2005 Document Revised: 08/18/2014 Document Reviewed: 03/24/2013 Elsevier Interactive Patient Education 2016 [employer] Inc.  

## 2015-09-10 IMAGING — DX DG CHEST 2V
2 series · 2 of 2 positions shown · non-contrast
Comparison: 02/28/2014 and previous including CT 12/12/2013

CLINICAL DATA: Chest pain. Cough. Shortness of breath. Smoking
history.

EXAM:
CHEST  2 VIEW

[chest pa]
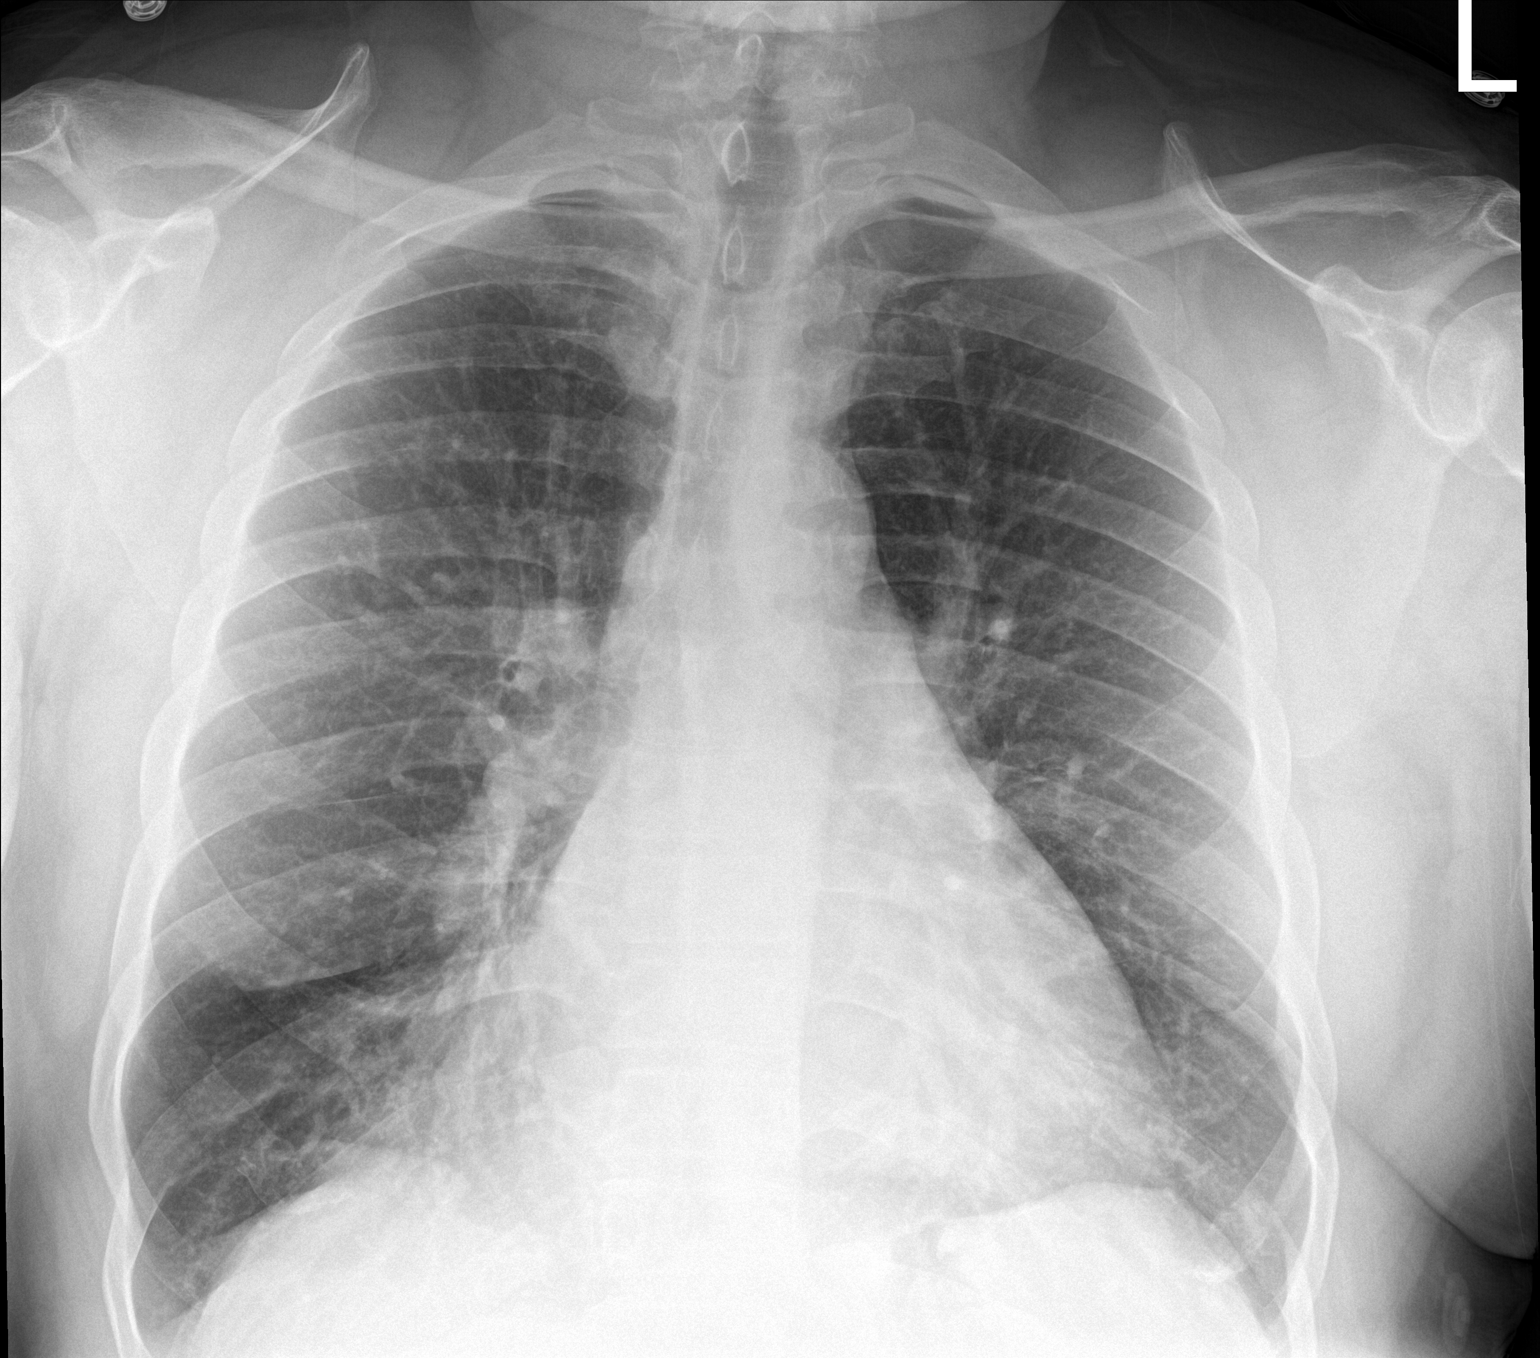

[chest lat]
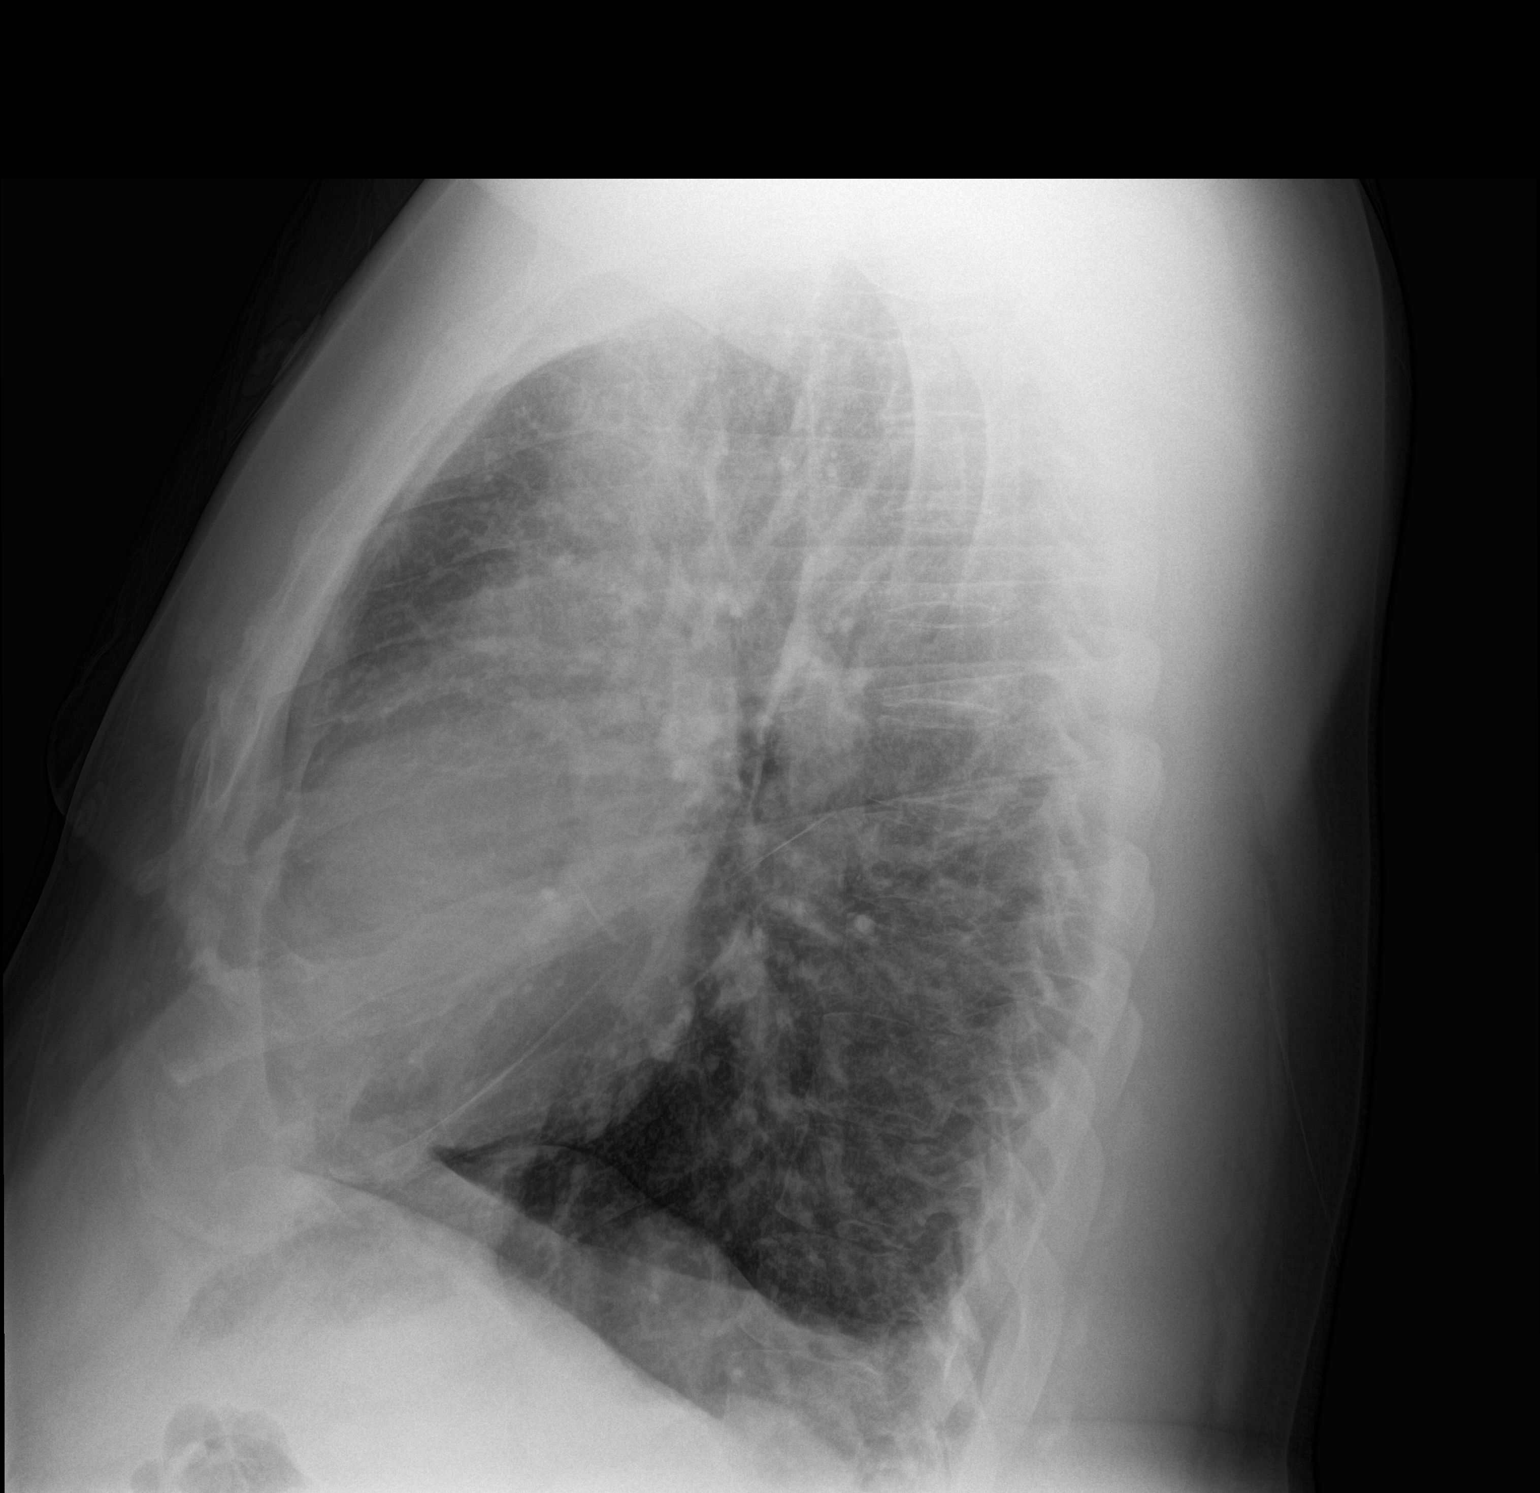

[2 of 2 positions shown; findings below may reference images not displayed]

FINDINGS: The heart is at the upper limits of normal in size. Mediastinal
shadows are normal. There is chronic indistinctness of the right
heart border related to pericardial/epicardial fat and adjacent
pulmonary scarring. This appears slightly more pronounced than on
previous studies in there could be some superimposed active
inflammation in the right middle lobe. No other area of concern. No
effusions. No bony findings.
IMPRESSION: Progressive loss of the right heart border compared to previous
studies. Previously, this has been seen as secondary to scarring and
epicardial fat. Because of the change in appearance, there could be
an active component in that region presently.

## 2015-09-14 ENCOUNTER — Telehealth: Payer: Self-pay

## 2015-09-14 NOTE — Telephone Encounter (Signed)
-----   Message from Jaclyn Shaggy, MD sent at 09/05/2015  8:34 AM EST ----- Potassium is normal

## 2015-09-14 NOTE — Telephone Encounter (Signed)
Nurse called patient, patient verified date of birth. Patient is aware of normal potassium.  Patient voices understanding and has no further questions at this time.

## 2015-09-27 ENCOUNTER — Ambulatory Visit (INDEPENDENT_AMBULATORY_CARE_PROVIDER_SITE_OTHER): Payer: Medicare Other | Admitting: Internal Medicine

## 2015-09-27 ENCOUNTER — Encounter: Payer: Self-pay | Admitting: Internal Medicine

## 2015-09-27 VITALS — BP 143/78 | HR 90 | Temp 97.5°F | Wt 276.0 lb

## 2015-09-27 DIAGNOSIS — Z794 Long term (current) use of insulin: Secondary | ICD-10-CM | POA: Diagnosis not present

## 2015-09-27 DIAGNOSIS — R6882 Decreased libido: Secondary | ICD-10-CM | POA: Diagnosis not present

## 2015-09-27 DIAGNOSIS — E1165 Type 2 diabetes mellitus with hyperglycemia: Secondary | ICD-10-CM | POA: Diagnosis not present

## 2015-09-27 DIAGNOSIS — E119 Type 2 diabetes mellitus without complications: Secondary | ICD-10-CM | POA: Diagnosis not present

## 2015-09-27 DIAGNOSIS — J441 Chronic obstructive pulmonary disease with (acute) exacerbation: Secondary | ICD-10-CM | POA: Diagnosis not present

## 2015-09-27 DIAGNOSIS — B20 Human immunodeficiency virus [HIV] disease: Secondary | ICD-10-CM

## 2015-09-27 DIAGNOSIS — Z21 Asymptomatic human immunodeficiency virus [HIV] infection status: Secondary | ICD-10-CM | POA: Diagnosis present

## 2015-09-27 LAB — COMPLETE METABOLIC PANEL WITH GFR
ALBUMIN: 4.1 g/dL (ref 3.6–5.1)
ALK PHOS: 42 U/L (ref 40–115)
ALT: 22 U/L (ref 9–46)
AST: 15 U/L (ref 10–40)
BILIRUBIN TOTAL: 0.5 mg/dL (ref 0.2–1.2)
BUN: 6 mg/dL — ABNORMAL LOW (ref 7–25)
CALCIUM: 8.8 mg/dL (ref 8.6–10.3)
CO2: 31 mmol/L (ref 20–31)
CREATININE: 0.78 mg/dL (ref 0.60–1.35)
Chloride: 98 mmol/L (ref 98–110)
Glucose, Bld: 254 mg/dL — ABNORMAL HIGH (ref 65–99)
Potassium: 4.6 mmol/L (ref 3.5–5.3)
Sodium: 135 mmol/L (ref 135–146)
TOTAL PROTEIN: 7.7 g/dL (ref 6.1–8.1)

## 2015-09-27 LAB — CBC WITH DIFFERENTIAL/PLATELET
BASOS ABS: 0 10*3/uL (ref 0.0–0.1)
BASOS PCT: 0 % (ref 0–1)
EOS PCT: 3 % (ref 0–5)
Eosinophils Absolute: 0.2 10*3/uL (ref 0.0–0.7)
HCT: 46.1 % (ref 39.0–52.0)
HEMOGLOBIN: 14.1 g/dL (ref 13.0–17.0)
LYMPHS ABS: 1.8 10*3/uL (ref 0.7–4.0)
LYMPHS PCT: 36 % (ref 12–46)
MCH: 28.7 pg (ref 26.0–34.0)
MCHC: 30.6 g/dL (ref 30.0–36.0)
MCV: 93.7 fL (ref 78.0–100.0)
MPV: 11.2 fL (ref 8.6–12.4)
Monocytes Absolute: 0.6 10*3/uL (ref 0.1–1.0)
Monocytes Relative: 11 % (ref 3–12)
NEUTROS ABS: 2.5 10*3/uL (ref 1.7–7.7)
NEUTROS PCT: 50 % (ref 43–77)
PLATELETS: 212 10*3/uL (ref 150–400)
RBC: 4.92 MIL/uL (ref 4.22–5.81)
RDW: 13.6 % (ref 11.5–15.5)
WBC: 5 10*3/uL (ref 4.0–10.5)

## 2015-09-27 NOTE — Progress Notes (Signed)
Patient ID: Chris [name], male   DOB: 12-24-1965, 50 y.o.   MRN: [id number]       Patient ID: Chris [name], male   DOB: 08/27/1965, 50 y.o.   MRN: [id number]  HPI 50yo M with HIV disease, poorly controlled IDDM2, OSA, COPD, on supp O2, CD 4 count of 90 (jan)/VL <20 (nov) - dropped in CD 4 count from 300s, this was in the setting of being treated for copd exacerbation. He states that he has been doing well with his hiv med adherence. His BS have been running high on recent lab work as well as on finger sticks. He states the has poor diet to account for it.  Outpatient Encounter Prescriptions as of 09/27/2015  Medication Sig  . acetaminophen-codeine (TYLENOL #3) 300-30 MG tablet Take 1 tablet by mouth every 8 (eight) hours as needed for moderate pain.  [name] albuterol (PROVENTIL HFA;VENTOLIN HFA) 108 (90 BASE) MCG/ACT inhaler Inhale 2 puffs into the lungs every 4 (four) hours as needed for wheezing or shortness of breath (((PLAN B))).  [name] albuterol (PROVENTIL) (5 MG/ML) 0.5% nebulizer solution Take 0.5 mLs (2.5 mg total) by nebulization every 4 (four) hours as needed for wheezing or shortness of breath (((PLAN C))).  . beclomethasone (QVAR) 80 MCG/ACT inhaler Inhale 2 puffs into the lungs 2 (two) times daily.  . Blood Glucose Monitoring Suppl (ACCU-CHEK AVIVA PLUS) W/DEVICE KIT 250 Test up to 4 times daily  . cetirizine (ZYRTEC) 10 MG tablet Take 1 tablet (10 mg total) by mouth daily.  . dapsone 100 MG tablet TAKE 1 TABLET BY MOUTH DAILY  . glucose blood test strip Use as instructed  . insulin glargine (LANTUS) 100 UNIT/ML injection Inject 0.2 mLs (20 Units total) into the skin at bedtime.  . Insulin Pen Needle 32G X 5 MM MISC Use as directed  . Insulin Syringe-Needle U-100 (BD INSULIN SYRINGE ULTRAFINE) 31G X 15/64" 0.5 ML MISC 1 each by Does not apply route at bedtime.  [name] lisinopril-hydrochlorothiazide (PRINZIDE,ZESTORETIC) 10-12.5 MG tablet Take 1 tablet by mouth daily.  . metFORMIN (GLUCOPHAGE) 500 MG  tablet Take 1 tablet (500 mg total) by mouth 2 (two) times daily with a meal.  . PREZISTA 800 MG tablet TAKE 1 TABLET BY MOUTH DAILY WITH BREAKFAST  . sildenafil (VIAGRA) 50 MG tablet Take 0.5 tablets (25 mg total) by mouth daily as needed for erectile dysfunction.  . STRIBILD 150-150-200-300 MG TABS tablet TAKE 1 TABLET BY MOUTH DAILY WITH BREAKFAST  . tiotropium (SPIRIVA HANDIHALER) 18 MCG inhalation capsule Place 1 capsule (18 mcg total) into inhaler and inhale every morning.   No facility-administered encounter medications on file as of 09/27/2015.     Patient Active Problem List   Diagnosis Date Noted  . Obesity 09/04/2015  . Respiratory distress 08/14/2015  . Acute respiratory failure with hypoxia ([place]) 08/14/2015  . Primary osteoarthritis of right knee 03/26/2015  . Abscess of right breast 02/20/2015  . Genital warts 01/22/2015  . Other male erectile dysfunction 01/22/2015  . Type 2 diabetes mellitus with complication ([place]) 35/00/9381  . Pneumonia 12/14/2014  . HCAP (healthcare-associated pneumonia) 12/07/2014  . Essential hypertension, benign 12/04/2014  . Atopic eczema 12/04/2014  . Dyspnea 12/12/2013  . Acute on chronic respiratory failure ([place]) 12/12/2013  . COPD exacerbation ([place]) 08/23/2013  . Cyst (solitary) of breast 08/23/2013  . Breast abscess 08/23/2013  . OSA (obstructive sleep apnea) 08/23/2013  . Smoker 02/17/2013  . Hyperglycemia 02/17/2013  . Uncontrolled diabetes mellitus ([place]) 02/17/2013  .  DM (diabetes mellitus) ([place]) 01/27/2013  . Steroid-induced hyperglycemia 01/12/2013  . Chronic respiratory failure ([place]) 01/12/2013  . COPD  GOLD III, still smoking 01/11/2013  . Nicotine abuse 01/11/2013  . HIV disease ([place]) 12/06/2012     Health Maintenance Due  Topic Date Due  . FOOT EXAM  04/12/1976  . OPHTHALMOLOGY EXAM  04/12/1976  . TETANUS/TDAP  04/12/1985     Review of Systems  Constitutional: Negative for fever, chills, diaphoresis, activity  change, appetite change, fatigue and unexpected weight change.  HENT: Negative for congestion, sore throat, rhinorrhea, sneezing, trouble swallowing and sinus pressure.  Eyes: Negative for photophobia and visual disturbance.  Respiratory: DOE that is no worse from baseline.Negative for cough, chest tightness, shortness of breath, wheezing and stridor.  Cardiovascular: Negative for chest pain, palpitations and leg swelling.  Gastrointestinal: Negative for nausea, vomiting, abdominal pain, diarrhea, constipation, blood in stool, abdominal distention and anal bleeding.  Genitourinary: Negative for dysuria, hematuria, flank pain and difficulty urinating.  Musculoskeletal: Negative for myalgias, back pain, joint swelling, arthralgias and gait problem.  Skin: Negative for color change, pallor, rash and wound.  Neurological: Negative for dizziness, tremors, weakness and light-headedness.  Hematological: Negative for adenopathy. Does not bruise/bleed easily.  Psychiatric/Behavioral: Negative for behavioral problems, confusion, sleep disturbance, dysphoric mood, decreased concentration and agitation.   Physical Exam  BP 143/78 mmHg  Pulse 90  Temp(Src) 97.5 F (36.4 C) (Oral)  Wt 276 lb (125.193 kg)  SpO2 91% Physical Exam  Constitutional: He is oriented to person, place, and time. He appears well-developed and well-nourished. No distress.  HENT:  Mouth/Throat: Oropharynx is clear and moist. No oropharyngeal exudate.  Cardiovascular: Normal rate, regular rhythm and normal heart sounds. Exam reveals no gallop and no friction rub.  No murmur heard.  Pulmonary/Chest: Effort normal and breath sounds normal. No respiratory distress. He has no wheezes. Wears supplemental O2. Abdominal: Soft. Bowel sounds are normal. He exhibits no distension. There is no tenderness.  Lymphadenopathy:  He has no cervical adenopathy.  Neurological: He is alert and oriented to person, place, and time.  Skin: Skin is  warm and dry. No rash noted. No erythema.  Psychiatric: He has a normal mood and affect. His behavior is normal.     Lab Results  Component Value Date   CD4TCELL 9* 08/14/2015   Lab Results  Component Value Date   CD4TABS 90* 08/14/2015   CD4TABS 340* 06/18/2015   CD4TABS 190* 12/07/2014   Lab Results  Component Value Date   HIV1RNAQUANT <20 06/18/2015   Lab Results  Component Value Date   HEPBSAB REACTIVE* 12/06/2012   No results found for: RPR  CBC Lab Results  Component Value Date   WBC 5.8 08/14/2015   RBC 4.46 08/14/2015   HGB 13.0 08/14/2015   HCT 42.6 08/14/2015   PLT 144* 08/14/2015   MCV 95.5 08/14/2015   MCH 29.1 08/14/2015   MCHC 30.5 08/14/2015   RDW 12.9 08/14/2015   LYMPHSABS 0.4* 08/14/2015   MONOABS 0.2 08/14/2015   EOSABS 0.0 08/14/2015   BASOSABS 0.0 08/14/2015   BMET Lab Results  Component Value Date   NA 134* 09/04/2015   K 5.0 09/04/2015   CL 98 09/04/2015   CO2 32* 09/04/2015   GLUCOSE 286* 09/04/2015   BUN 9 09/04/2015   CREATININE 0.87 09/04/2015   CALCIUM 8.9 09/04/2015   GFRNONAA >60 08/15/2015   GFRAA >60 08/15/2015     Assessment and Plan  hiv disease = he had been  ill at hte time of last blood draw which would account for sudden change in CD 4 count. Will check CD 4 count and VL at this visit  Poorly controlled DM = has increased soda intake of  2L pepsi per day. Discussed ways to decrease and aim for weight gain over next 3 months   Morbid obesity = will try diet modification and see if can be able to refer to nutritionist

## 2015-09-28 LAB — T-HELPER CELL (CD4) - (RCID CLINIC ONLY)
CD4 % Helper T Cell: 13 % — ABNORMAL LOW (ref 33–55)
CD4 T CELL ABS: 230 /uL — AB (ref 400–2700)

## 2015-09-28 LAB — HIV-1 RNA QUANT-NO REFLEX-BLD
HIV 1 RNA Quant: <20 copies/mL (ref <20)
HIV-1 RNA Quant, Log: <1.3 Log copies/mL (ref <1.30)

## 2015-10-04 ENCOUNTER — Ambulatory Visit: Payer: Medicare Other | Attending: Internal Medicine | Admitting: Internal Medicine

## 2015-10-04 ENCOUNTER — Encounter: Payer: Self-pay | Admitting: Internal Medicine

## 2015-10-04 VITALS — BP 140/79 | HR 90 | Temp 98.1°F | Resp 18 | Ht 69.0 in | Wt 272.5 lb

## 2015-10-04 DIAGNOSIS — Z888 Allergy status to other drugs, medicaments and biological substances status: Secondary | ICD-10-CM | POA: Diagnosis not present

## 2015-10-04 DIAGNOSIS — R490 Dysphonia: Secondary | ICD-10-CM | POA: Diagnosis not present

## 2015-10-04 DIAGNOSIS — E1165 Type 2 diabetes mellitus with hyperglycemia: Secondary | ICD-10-CM | POA: Insufficient documentation

## 2015-10-04 DIAGNOSIS — B20 Human immunodeficiency virus [HIV] disease: Secondary | ICD-10-CM | POA: Insufficient documentation

## 2015-10-04 DIAGNOSIS — Z794 Long term (current) use of insulin: Secondary | ICD-10-CM | POA: Insufficient documentation

## 2015-10-04 DIAGNOSIS — M545 Low back pain: Secondary | ICD-10-CM | POA: Insufficient documentation

## 2015-10-04 DIAGNOSIS — E118 Type 2 diabetes mellitus with unspecified complications: Secondary | ICD-10-CM

## 2015-10-04 DIAGNOSIS — I10 Essential (primary) hypertension: Secondary | ICD-10-CM | POA: Diagnosis not present

## 2015-10-04 DIAGNOSIS — Z79899 Other long term (current) drug therapy: Secondary | ICD-10-CM | POA: Diagnosis not present

## 2015-10-04 DIAGNOSIS — M1711 Unilateral primary osteoarthritis, right knee: Secondary | ICD-10-CM | POA: Insufficient documentation

## 2015-10-04 DIAGNOSIS — J449 Chronic obstructive pulmonary disease, unspecified: Secondary | ICD-10-CM

## 2015-10-04 DIAGNOSIS — G4733 Obstructive sleep apnea (adult) (pediatric): Secondary | ICD-10-CM | POA: Diagnosis not present

## 2015-10-04 DIAGNOSIS — F17210 Nicotine dependence, cigarettes, uncomplicated: Secondary | ICD-10-CM | POA: Diagnosis not present

## 2015-10-04 MED ORDER — ALBUTEROL SULFATE HFA 108 (90 BASE) MCG/ACT IN AERS
2.0000 | INHALATION_SPRAY | RESPIRATORY_TRACT | Status: DC | PRN
Start: 2015-10-04 — End: 2016-01-02

## 2015-10-04 MED ORDER — LISINOPRIL-HYDROCHLOROTHIAZIDE 10-12.5 MG PO TABS: 1.0000 | ORAL_TABLET | Freq: Every day | ORAL | Status: DC

## 2015-10-04 MED ORDER — VARENICLINE TARTRATE 0.5 MG X 11 & 1 MG X 42 PO MISC: ORAL | Status: DC

## 2015-10-04 MED ORDER — CETIRIZINE HCL 10 MG PO TABS: 10.0000 mg | ORAL_TABLET | Freq: Every day | ORAL | Status: DC

## 2015-10-04 MED ORDER — BECLOMETHASONE DIPROPIONATE 80 MCG/ACT IN AERS: 2.0000 | INHALATION_SPRAY | Freq: Two times a day (BID) | RESPIRATORY_TRACT | Status: DC

## 2015-10-04 MED ORDER — ACETAMINOPHEN-CODEINE #3 300-30 MG PO TABS: 1.0000 | ORAL_TABLET | Freq: Three times a day (TID) | ORAL | Status: DC | PRN

## 2015-10-04 MED ORDER — METFORMIN HCL 500 MG PO TABS: 500.0000 mg | ORAL_TABLET | Freq: Two times a day (BID) | ORAL | Status: DC

## 2015-10-04 NOTE — Patient Instructions (Signed)
Smoking Cessation, Tips for Success If you are ready to quit smoking, congratulations! You have chosen to help yourself be healthier. Cigarettes bring nicotine, tar, carbon monoxide, and other irritants into your body. Your lungs, heart, and blood vessels will be able to work better without these poisons. There are many different ways to quit smoking. Nicotine gum, nicotine patches, a nicotine inhaler, or nicotine nasal spray can help with physical craving. Hypnosis, support groups, and medicines help break the habit of smoking. WHAT THINGS CAN I DO TO MAKE QUITTING EASIER?  Here are some tips to help you quit for good:  Pick a date when you will quit smoking completely. Tell all of your friends and family about your plan to quit on that date.  Do not try to slowly cut down on the number of cigarettes you are smoking. Pick a quit date and quit smoking completely starting on that day.  Throw away all cigarettes.   Clean and remove all ashtrays from your home, work, and car.  On a card, write down your reasons for quitting. Carry the card with you and read it when you get the urge to smoke.  Cleanse your body of nicotine. Drink enough water and fluids to keep your urine clear or pale yellow. Do this after quitting to flush the nicotine from your body.  Learn to predict your moods. Do not let a bad situation be your excuse to have a cigarette. Some situations in your life might tempt you into wanting a cigarette.  Never have "just one" cigarette. It leads to wanting another and another. Remind yourself of your decision to quit.  Change habits associated with smoking. If you smoked while driving or when feeling stressed, try other activities to replace smoking. Stand up when drinking your coffee. Brush your teeth after eating. Sit in a different chair when you read the paper. Avoid alcohol while trying to quit, and try to drink fewer caffeinated beverages. Alcohol and caffeine may urge you to  smoke.  Avoid foods and drinks that can trigger a desire to smoke, such as sugary or spicy foods and alcohol.  Ask people who smoke not to smoke around you.  Have something planned to do right after eating or having a cup of coffee. For example, plan to take a walk or exercise.  Try a relaxation exercise to calm you down and decrease your stress. Remember, you may be tense and nervous for the first 2 weeks after you quit, but this will pass.  Find new activities to keep your hands busy. Play with a pen, coin, or rubber band. Doodle or draw things on paper.  Brush your teeth right after eating. This will help cut down on the craving for the taste of tobacco after meals. You can also try mouthwash.   Use oral substitutes in place of cigarettes. Try using lemon drops, carrots, cinnamon sticks, or chewing gum. Keep them handy so they are available when you have the urge to smoke.  When you have the urge to smoke, try deep breathing.  Designate your home as a nonsmoking area.  If you are a heavy smoker, ask your health care provider about a prescription for nicotine chewing gum. It can ease your withdrawal from nicotine.  Reward yourself. Set aside the cigarette money you save and buy yourself something nice.  Look for support from others. Join a support group or smoking cessation program. Ask someone at home or at work to help you with your plan   to quit smoking.  Always ask yourself, "Do I need this cigarette or is this just a reflex?" Tell yourself, "Today, I choose not to smoke," or "I do not want to smoke." You are reminding yourself of your decision to quit.  Do not replace cigarette smoking with electronic cigarettes (commonly called e-cigarettes). The safety of e-cigarettes is unknown, and some may contain harmful chemicals.  If you relapse, do not give up! Plan ahead and think about what you will do the next time you get the urge to smoke. HOW WILL I FEEL WHEN I QUIT SMOKING? You  may have symptoms of withdrawal because your body is used to nicotine (the addictive substance in cigarettes). You may crave cigarettes, be irritable, feel very hungry, cough often, get headaches, or have difficulty concentrating. The withdrawal symptoms are only temporary. They are strongest when you first quit but will go away within 10-14 days. When withdrawal symptoms occur, stay in control. Think about your reasons for quitting. Remind yourself that these are signs that your body is healing and getting used to being without cigarettes. Remember that withdrawal symptoms are easier to treat than the major diseases that smoking can cause.  Even after the withdrawal is over, expect periodic urges to smoke. However, these cravings are generally short lived and will go away whether you smoke or not. Do not smoke! WHAT RESOURCES ARE AVAILABLE TO HELP ME QUIT SMOKING? Your health care provider can direct you to community resources or hospitals for support, which may include:  Group support.  Education.  Hypnosis.  Therapy.   This information is not intended to replace advice given to you by your health care provider. Make sure you discuss any questions you have with your health care provider.   Document Released: 04/25/2004 Document Revised: 08/18/2014 Document Reviewed: 01/13/2013 Elsevier Interactive Patient Education 2016 Else[employer] Inc. Basic Carbohydrate Counting for Diabetes Mellitus Carbohydrate counting is a method for keeping track of the amount of carbohydrates you eat. Eating carbohydrates naturally increases the level of sugar (glucose) in your blood, so it is important for you to know the amount that is okay for you to have in every meal. Carbohydrate counting helps keep the level of glucose in your blood within normal limits. The amount of carbohydrates allowed is different for every person. A dietitian can help you calculate the amount that is right for you. Once you know the amount of  carbohydrates you can have, you can count the carbohydrates in the foods you want to eat. Carbohydrates are found in the following foods:  Grains, such as breads and cereals.  Dried beans and soy products.  Starchy vegetables, such as potatoes, peas, and corn.  Fruit and fruit juices.  Milk and yogurt.  Sweets and snack foods, such as cake, cookies, candy, chips, soft drinks, and fruit drinks. CARBOHYDRATE COUNTING There are two ways to count the carbohydrates in your food. You can use either of the methods or a combination of both. Reading the "Nutrition Facts" on Packaged Food The "Nutrition Facts" is an area that is included on the labels of almost all packaged food and beverages in the [place]. It includes the serving size of that food or beverage and information about the nutrients in each serving of the food, including the grams (g) of carbohydrate per serving.  Decide the number of servings of this food or beverage that you will be able to eat or drink. Multiply that number of servings by the number of grams of  carbohydrate that is listed on the label for that serving. The total will be the amount of carbohydrates you will be having when you eat or drink this food or beverage. Learning Standard Serving Sizes of Food When you eat food that is not packaged or does not include "Nutrition Facts" on the label, you need to measure the servings in order to count the amount of carbohydrates.A serving of most carbohydrate-rich foods contains about 15 g of carbohydrates. The following list includes serving sizes of carbohydrate-rich foods that provide 15 g ofcarbohydrate per serving:   1 slice of bread (1 oz) or 1 six-inch tortilla.    of a hamburger bun or English muffin.  4-6 crackers.   cup unsweetened dry cereal.    cup hot cereal.   cup rice or pasta.    cup mashed potatoes or  of a large baked potato.  1 cup fresh fruit or one small piece of fruit.    cup  canned or frozen fruit or fruit juice.  1 cup milk.   cup plain fat-free yogurt or yogurt sweetened with artificial sweeteners.   cup cooked dried beans or starchy vegetable, such as peas, corn, or potatoes.  Decide the number of standard-size servings that you will eat. Multiply that number of servings by 15 (the grams of carbohydrates in that serving). For example, if you eat 2 cups of strawberries, you will have eaten 2 servings and 30 g of carbohydrates (2 servings x 15 g = 30 g). For foods such as soups and casseroles, in which more than one food is mixed in, you will need to count the carbohydrates in each food that is included. EXAMPLE OF CARBOHYDRATE COUNTING Sample Dinner  3 oz chicken breast.   cup of brown rice.   cup of corn.  1 cup milk.   1 cup strawberries with sugar-free whipped topping.  Carbohydrate Calculation Step 1: Identify the foods that contain carbohydrates:   Rice.   Corn.   Milk.   Strawberries. Step 2:Calculate the number of servings eaten of each:   2 servings of rice.   1 serving of corn.   1 serving of milk.   1 serving of strawberries. Step 3: Multiply each of those number of servings by 15 g:   2 servings of rice x 15 g = 30 g.   1 serving of corn x 15 g = 15 g.   1 serving of milk x 15 g = 15 g.   1 serving of strawberries x 15 g = 15 g. Step 4: Add together all of the amounts to find the total grams of carbohydrates eaten: 30 g + 15 g + 15 g + 15 g = 75 g.   This information is not intended to replace advice given to you by your health care provider. Make sure you discuss any questions you have with your health care provider.   Document Released: 07/28/2005 Document Revised: 08/18/2014 Document Reviewed: 06/24/2013 Elsevier Interactive Patient Education [employer]

## 2015-10-04 NOTE — Progress Notes (Signed)
Chris [name], is a 50 y.o. male  TDH:[id number]  MIW:[id number]  DOB - Jan 04, 1966  CC:  Chief Complaint  Patient presents with  . Follow-up    COPD       Chris [name] a 50 y.o. male here today for follow up visit for COPD and medication refills. Patient has history of COPD, GOLD III (continues to smoke uses qvar and spiriva), OSA, Diabetes Mellitus T2 (CBG 154 today), HIV,(CD4 count of 90 from 08/14/15, currently on antiviral therapy and followed by Infectious disease), Hyperglycemia, Morbid obesity, Nicotine abuse, and Erectile dysfunction. Patient also with complaints of lower back pain, right knee pain and SOB. Patient reports pain today in lower back rated at a 8 described as aching and worse with activity. Pain has been present for last several months and is constant. Patient reports pain today in his right knee which he reports he had surgery on and rods placed in it a while ago. Pain rated at a 7 described as aching. Pain has been present for years and is constant. Patient reports he was prescribed tylenol #3 for pain but that no longer helps with the knee pain and lower back pain. Patient requesting stronger pain medication. Patient reports he still gets tired and SOB sometimes but not as often as he was. Patient has No headache, No chest pain, No abdominal pain - No Nausea, No new weakness tingling or numbness.  Allergies  Allergen Reactions  . Bactrim [Sulfamethoxazole-Trimethoprim] Hives   Past Medical History  Diagnosis Date  . COPD (chronic obstructive pulmonary disease) ([place])   . HIV disease ([place])   . Emphysema   . Diabetes mellitus without complication [facility])    Current Outpatient Prescriptions on File Prior to Visit  Medication Sig Dispense Refill  . albuterol (PROVENTIL) (5 MG/ML) 0.5% nebulizer solution Take 0.5 mLs (2.5 mg total) by nebulization every 4 (four) hours as needed for wheezing or shortness of breath (((PLAN C))). 20 mL 1  . Blood Glucose Monitoring Suppl  (ACCU-CHEK AVIVA PLUS) W/DEVICE KIT 250 Test up to 4 times daily 1 kit 0  . dapsone 100 MG tablet TAKE 1 TABLET BY MOUTH DAILY 30 tablet 5  . glucose blood test strip Use as instructed 100 each 12  . insulin glargine (LANTUS) 100 UNIT/ML injection Inject 0.2 mLs (20 Units total) into the skin at bedtime. 10 mL 3  . Insulin Pen Needle 32G X 5 MM MISC Use as directed 100 each 11  . Insulin Syringe-Needle U-100 (BD INSULIN SYRINGE ULTRAFINE) 31G X 15/64" 0.5 ML MISC 1 each by Does not apply route at bedtime. 30 each 5  . PREZISTA 800 MG tablet TAKE 1 TABLET BY MOUTH DAILY WITH BREAKFAST 30 tablet 5  . sildenafil (VIAGRA) 50 MG tablet Take 0.5 tablets (25 mg total) by mouth daily as needed for erectile dysfunction. 30 tablet 5  . STRIBILD 150-150-200-300 MG TABS tablet TAKE 1 TABLET BY MOUTH DAILY WITH BREAKFAST 30 tablet 5  . tiotropium (SPIRIVA HANDIHALER) 18 MCG inhalation capsule Place 1 capsule (18 mcg total) into inhaler and inhale every morning. 30 capsule 12   No current facility-administered medications on file prior to visit.   Family History  Problem Relation Age of Onset  . Emphysema Maternal Uncle     was a smoker  . Asthma Maternal Uncle   . Asthma Maternal Aunt   . Throat cancer Maternal Grandmother     never smoker, used snuff  . Breast cancer Maternal Aunt   .  Cancer Father 47    throat   Social History   Social History  . Marital Status: Married    Spouse Name: N/A  . Number of Children: 0  . Years of Education: N/A   Occupational History  . Unemployed    Social History Main Topics  . Smoking status: Current Some Day Smoker -- 0.25 packs/day    Types: Cigarettes  . Smokeless tobacco: Never Used     Comment: Started smoking at age 72.  Currently smoking 4 cig per day. cautioned with O2 use  . Alcohol Use: No  . Drug Use: No     Comment: quit 04  . Sexual Activity: Yes    Birth Control/ Protection: Condom   Other Topics Concern  . Not on file   Social  History Narrative    Review of Systems: Constitutional: Negative for fever, chills, diaphoresis, activity change, appetite change and fatigue. HENT: Negative for ear pain, nosebleeds, congestion, facial swelling, rhinorrhea, neck pain, neck stiffness and ear discharge.  Eyes: Negative for pain, discharge, redness, itching and visual disturbance. Respiratory: Negative for cough, choking, chest tightness, patient on home O2, 2L and presents with difficulty catching his breath. Cardiovascular: Negative for chest pain, palpitations and leg swelling. Gastrointestinal: Negative for abdominal distention. Genitourinary: Negative for dysuria, urgency, frequency, hematuria, flank pain, decreased urine volume, difficulty urinating and dyspareunia.  Musculoskeletal: Negative for back pain, joint swelling, arthralgia and gait problem. Neurological: Negative for dizziness, tremors, seizures, syncope, facial asymmetry, speech difficulty, weakness, light-headedness, numbness and headaches.  Hematological: Negative for adenopathy. Does not bruise/bleed easily. Psychiatric/Behavioral: Negative for hallucinations, behavioral problems, confusion, dysphoric mood, decreased concentration and agitation.    Objective:   Filed Vitals:   10/04/15 1118  BP: 140/79  Pulse: 90  Temp: 98.1 F (36.7 C)  Resp: 18    Physical Exam: Constitutional: Patient appears well-developed and well-nourished. No distress. HENT: Normocephalic, atraumatic, External right and left ear normal. Oropharynx is clear and moist.  Eyes: Conjunctivae and EOM are normal. PERRLA, no scleral icterus. Neck: Normal ROM. Neck supple. No JVD. No tracheal deviation. No thyromegaly. CVS: RRR, S1/S2 +, no murmurs, no gallops, no carotid bruit.  Pulmonary:Patient on home O2, and presents with SOB, no stridor, rhonchi, rales.  Abdominal: Soft. BS +, no distension, tenderness, rebound or guarding.  Musculoskeletal: Normal range of motion. No edema  and no tenderness.  Lymphadenopathy: No lymphadenopathy noted. Neuro: Alert & Oriented x 4 Skin: Skin is warm and dry. No rash noted. Not diaphoretic. No erythema. No pallor. Psychiatric: Normal mood and affect. Behavior, judgment, thought content normal.  Lab Results  Component Value Date   WBC 5.0 09/27/2015   HGB 14.1 09/27/2015   HCT 46.1 09/27/2015   MCV 93.7 09/27/2015   PLT 212 09/27/2015   Lab Results  Component Value Date   CREATININE 0.78 09/27/2015   BUN 6* 09/27/2015   NA 135 09/27/2015   K 4.6 09/27/2015   CL 98 09/27/2015   CO2 31 09/27/2015    Lab Results  Component Value Date   HGBA1C 7.50 08/21/2015   Lipid Panel     Component Value Date/Time   CHOL 233* 02/27/2015 1218   TRIG 136 02/27/2015 1218   HDL 28* 02/27/2015 1218   CHOLHDL 8.3 02/27/2015 1218   VLDL 27 02/27/2015 1218   LDLCALC 178* 02/27/2015 1218   Lab Results  Component Value Date   HGBA1C 7.50 08/21/2015   HGBA1C 6.0 07/18/2015   HGBA1C 5.60 03/26/2015  Assessment and plan:   Chris [name] was seen today for follow-up.  Diagnoses and all orders for this visit:  Primary osteoarthritis of right knee  -     acetaminophen-codeine (TYLENOL #3) 300-30 MG tablet; Take 1 tablet by mouth every 8 (eight) hours as needed for moderate pain.  COPD mixed type (HCC)  -     albuterol (PROVENTIL HFA;VENTOLIN HFA) 108 (90 Base) MCG/ACT inhaler; Inhale 2 puffs into the lungs every 4 (four) hours as needed for wheezing or shortness of breath (((PLAN B))). -     beclomethasone (QVAR) 80 MCG/ACT inhaler; Inhale 2 puffs into the lungs 2 (two) times daily.  Hoarseness of voice  -     cetirizine (ZYRTEC) 10 MG tablet; Take 1 tablet (10 mg total) by mouth daily.  Essential hypertension, benign  -     lisinopril-hydrochlorothiazide (PRINZIDE,ZESTORETIC) 10-12.5 MG tablet; Take 1 tablet by mouth daily.  We have discussed target BP range and blood pressure goal. I have advised patient to check BP  regularly and to call us back or report to clinic if the numbers are consistently higher than 140/90. We discussed the importance of compliance with medical therapy and DASH diet recommended, consequences of uncontrolled hypertension discussed.   - continue current BP medications  Cigarette nicotine dependence without complication  -     varenicline (CHANTIX STARTING MONTH PAK) 0.5 MG X 11 & 1 MG X 42 tablet; Take one 0.5 mg tablet by mouth once daily for 3 days, then increase to one 0.5 mg tablet twice daily for 4 days, then increase to one 1 mg tablet twice daily.  Chris [name] was counseled on the dangers of tobacco use, and was advised to quit. Reviewed strategies to maximize success, including removing cigarettes and smoking materials from environment, stress management and support of family/friends.   Type 2 diabetes mellitus with complication, with long-term current use of insulin (HCC) -     metFORMIN (GLUCOPHAGE) 500 MG tablet; Take 1 tablet (500 mg total) by mouth 2 (two) times daily with a meal.  Aim for 30 minutes of exercise most days. Rethink what you drink.  Water is great! Aim for 2-3 Carb Choices per meal (30-45 grams) +/- 1 either way   Aim for 0-15 Carbs per snack if hungry   Include protein in moderation with your meals and snacks   Consider reading food labels for Total Carbohydrate and Fat Grams of foods   Consider checking BG at alternate times per day   Continue taking medication as directed Be mindful about how much sugar you are adding to beverages and other foods.  Fruit Punch - find one with no sugar   Measure and decrease portions of carbohydrate foods   Make your plate and don't go back for seconds   Return in about 2 months (around 12/02/2015) for COPD, Follow up Pain and comorbidities, Hemoglobin A1C and Follow up, DM.  The patient was given clear instructions to go to ER or return to medical center if symptoms don't improve, worsen or new problems develop. The  patient verbalized understanding. The patient was told to call to get lab results if they haven't heard anything in the next week.     Clois Dupes, [facility] [phone] 10/04/2015, 2:03 PM  Evaluation and management procedures were performed by the Advanced Practitioner under my supervision and collaboration. I have reviewed the Advanced Practitioner's note and chart, and I agree with the management and plan.   JEGEDE,  Gabrielle Dare, MD, [place], [place], [place], [place] and [place], [place]   10/04/2015, 6:31 PM

## 2015-10-04 NOTE — Progress Notes (Signed)
Patient is here for COPD FU  Patient complains of back pain being present scaled currently at a 5.

## 2015-10-19 IMAGING — CR DG CHEST 2V
2 series · 2 of 2 positions shown · non-contrast
Comparison: 12/06/2014

CLINICAL DATA: Follow-up lingular infiltrate.  Shortness of breath

EXAM:
CHEST  2 VIEW

[view not recorded (1 of 2)]
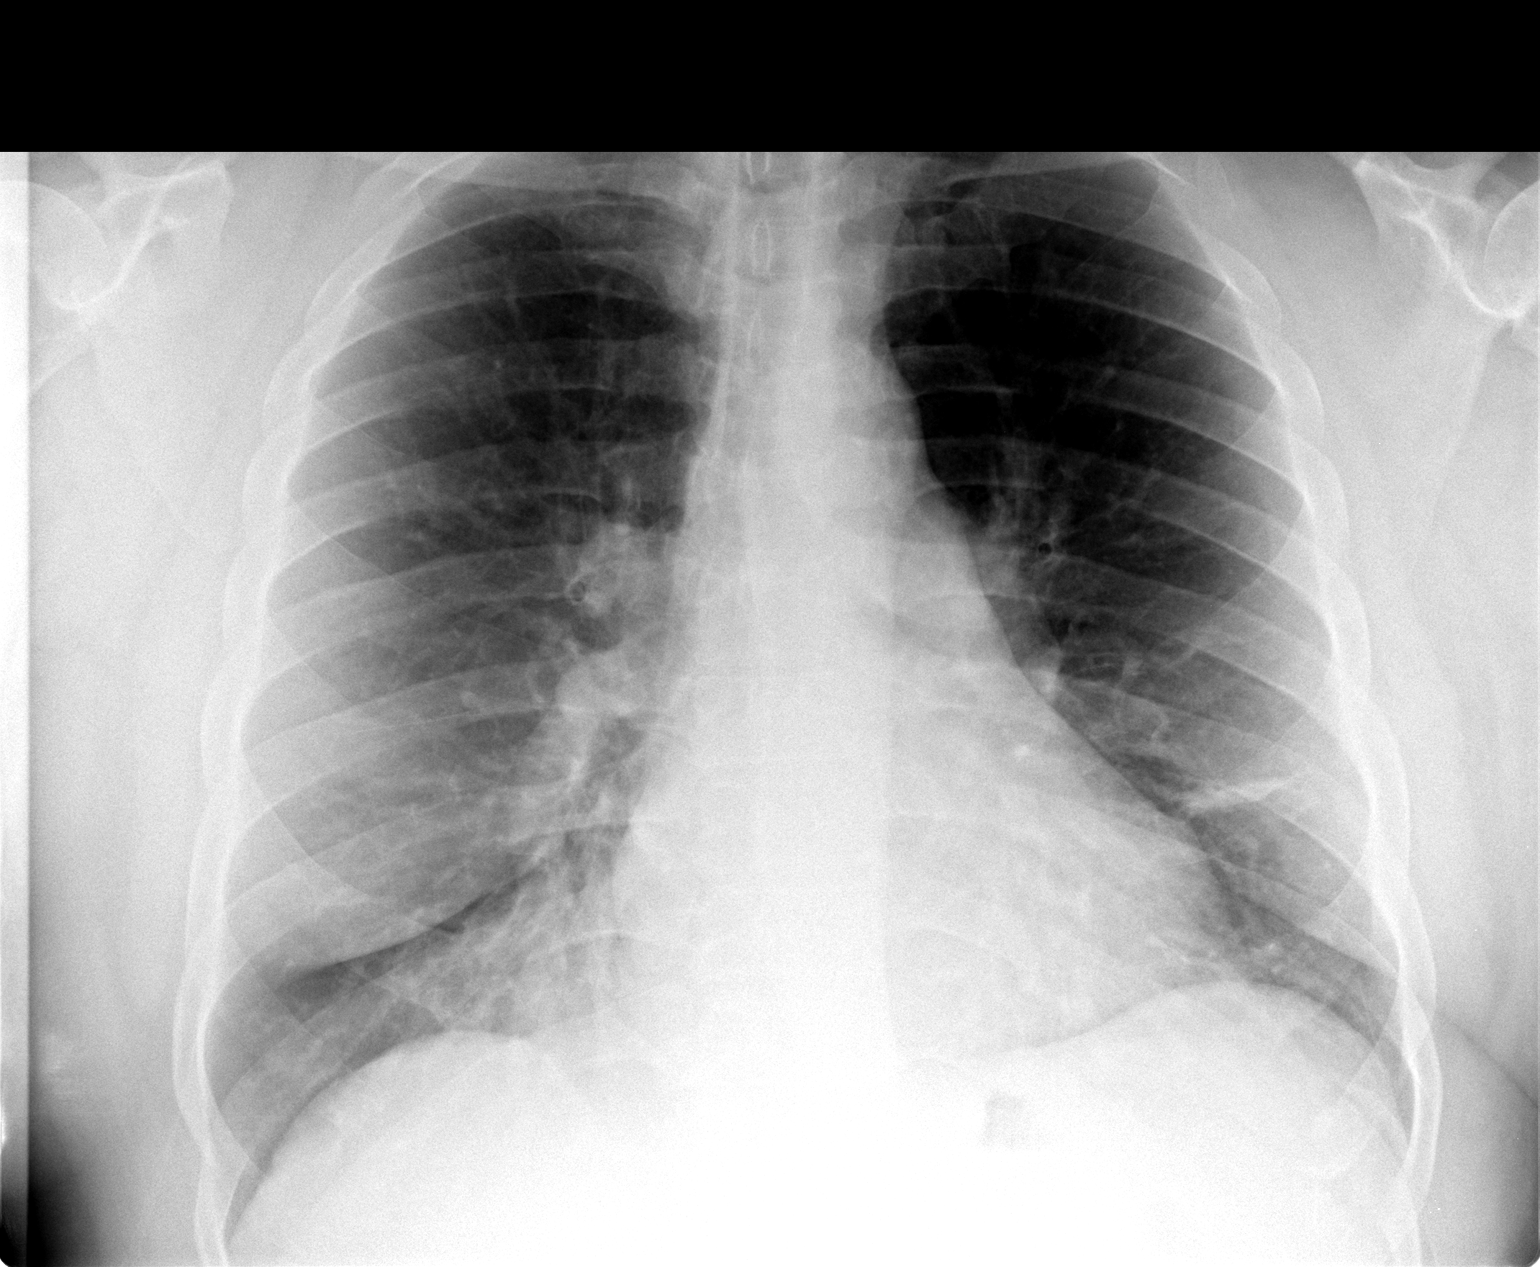

[view not recorded (2 of 2)]
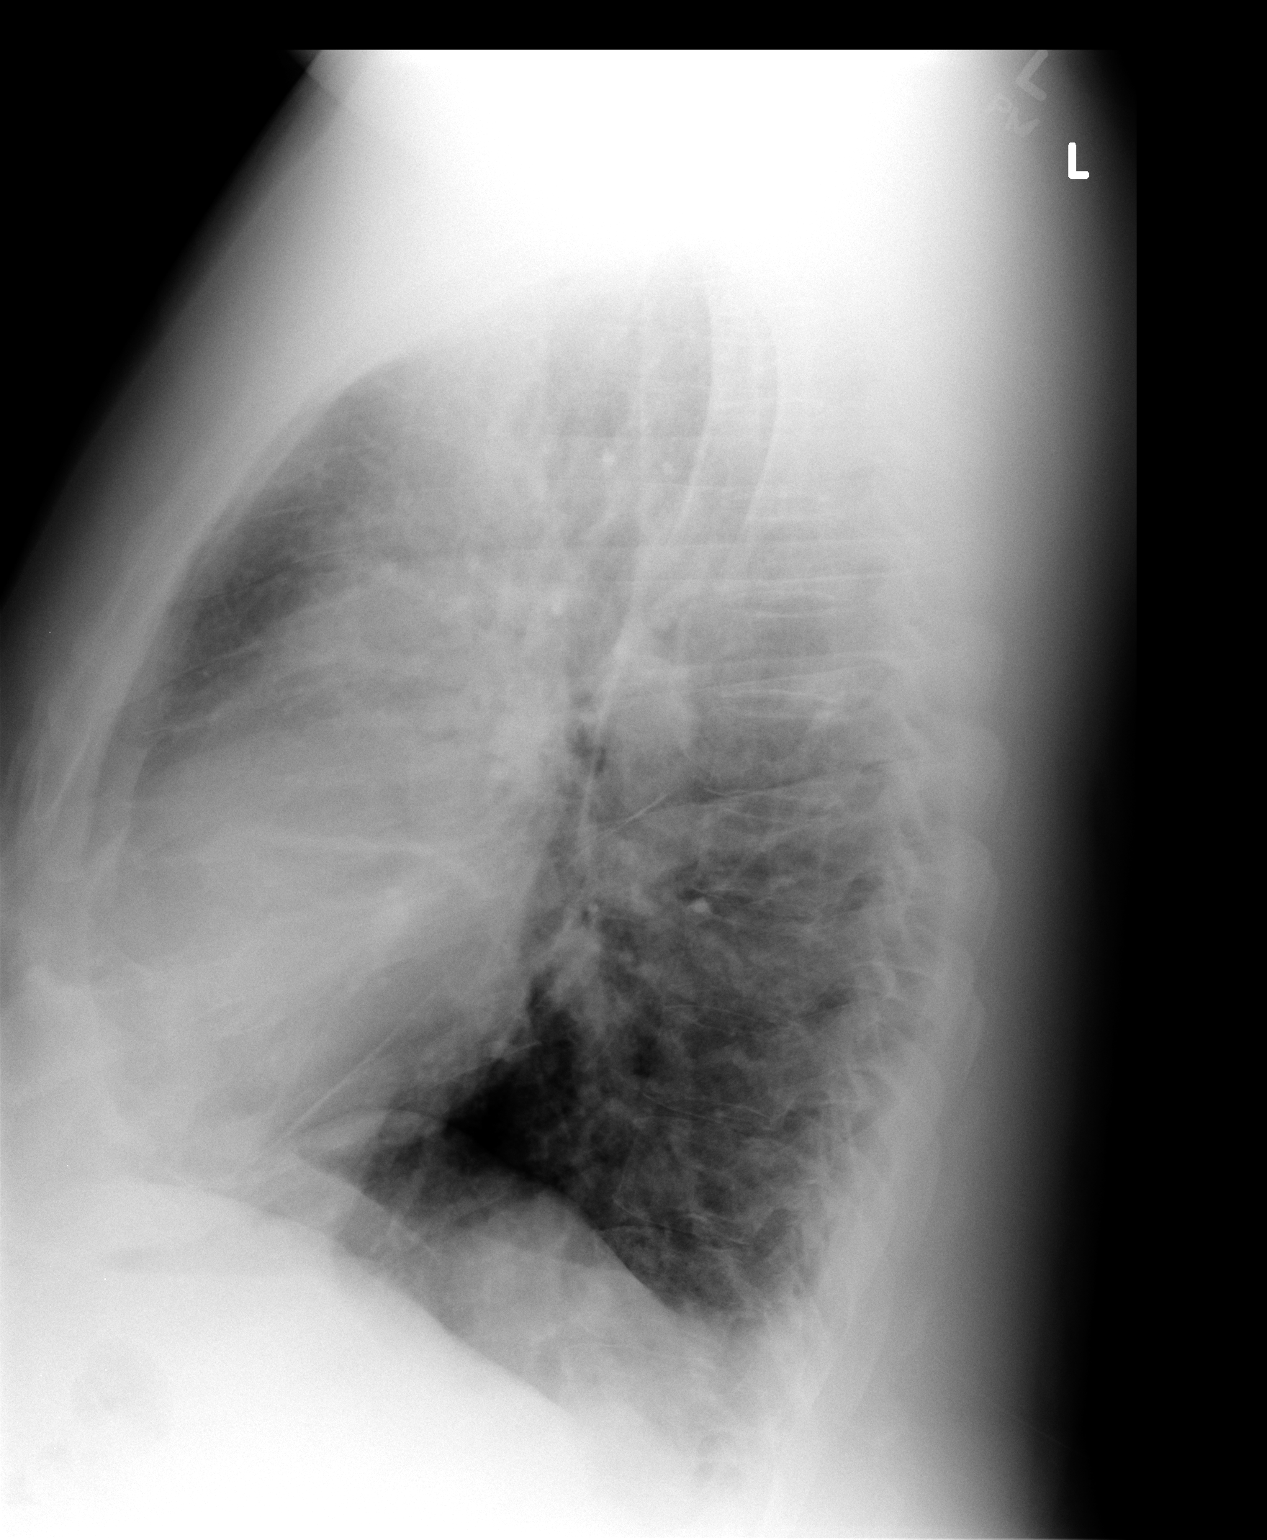

[2 of 2 positions shown; findings below may reference images not displayed]

FINDINGS: Linear density now present in the lingula, likely atelectasis.
Previous vague consolidation nearly completely resolved. Right lung
is clear. Heart is upper limits normal in size. No effusions. No
acute bony abnormality.
IMPRESSION: Resolution of the previously seen lingular infiltrate with new area
of subsegmental atelectasis.

## 2015-11-27 ENCOUNTER — Other Ambulatory Visit: Payer: Self-pay | Admitting: Internal Medicine

## 2015-11-29 ENCOUNTER — Telehealth: Payer: Self-pay

## 2015-11-29 ENCOUNTER — Other Ambulatory Visit: Payer: Self-pay | Admitting: Internal Medicine

## 2015-11-29 DIAGNOSIS — M1711 Unilateral primary osteoarthritis, right knee: Secondary | ICD-10-CM

## 2015-11-29 MED ORDER — ACETAMINOPHEN-CODEINE #3 300-30 MG PO TABS: 1.0000 | ORAL_TABLET | Freq: Three times a day (TID) | ORAL | Status: DC | PRN

## 2015-11-29 NOTE — Telephone Encounter (Signed)
This Case Manager received voicemail from patient indicating he needed refill of his pain medication. Return call placed to patient. Patient indicated he is experiencing back pain and right knee pain. Pain currently 8/10.  He indicated Tylenol #3 has helped control his pain in the past. Encounter routed to patient's PCP, Dr. Hyman HopesJegede, for consideration.

## 2015-12-10 ENCOUNTER — Other Ambulatory Visit: Payer: Self-pay | Admitting: Internal Medicine

## 2015-12-27 ENCOUNTER — Encounter: Payer: Self-pay | Admitting: Internal Medicine

## 2015-12-27 ENCOUNTER — Ambulatory Visit (INDEPENDENT_AMBULATORY_CARE_PROVIDER_SITE_OTHER): Payer: Medicare Other | Admitting: Internal Medicine

## 2015-12-27 ENCOUNTER — Ambulatory Visit: Payer: Medicare Other | Admitting: *Deleted

## 2015-12-27 VITALS — BP 128/81 | HR 88 | Temp 98.8°F | Ht 69.0 in | Wt 277.0 lb

## 2015-12-27 DIAGNOSIS — K3184 Gastroparesis: Secondary | ICD-10-CM | POA: Diagnosis not present

## 2015-12-27 DIAGNOSIS — Z21 Asymptomatic human immunodeficiency virus [HIV] infection status: Secondary | ICD-10-CM | POA: Diagnosis present

## 2015-12-27 DIAGNOSIS — E119 Type 2 diabetes mellitus without complications: Secondary | ICD-10-CM | POA: Diagnosis not present

## 2015-12-27 DIAGNOSIS — B20 Human immunodeficiency virus [HIV] disease: Secondary | ICD-10-CM | POA: Diagnosis not present

## 2015-12-27 DIAGNOSIS — J441 Chronic obstructive pulmonary disease with (acute) exacerbation: Secondary | ICD-10-CM | POA: Diagnosis not present

## 2015-12-27 DIAGNOSIS — E1165 Type 2 diabetes mellitus with hyperglycemia: Secondary | ICD-10-CM | POA: Diagnosis not present

## 2015-12-27 DIAGNOSIS — R6882 Decreased libido: Secondary | ICD-10-CM | POA: Diagnosis not present

## 2015-12-27 DIAGNOSIS — Z794 Long term (current) use of insulin: Secondary | ICD-10-CM | POA: Diagnosis not present

## 2015-12-27 MED ORDER — METOCLOPRAMIDE HCL 5 MG PO TABS: 5.0000 mg | ORAL_TABLET | Freq: Three times a day (TID) | ORAL | Status: DC

## 2015-12-27 NOTE — Progress Notes (Signed)
Patient ID: Chris [name], male   DOB: 06-11-1966, 50 y.o.   MRN: [id number]       Patient ID: Chris [name], male   DOB: 1966-02-03, 50 y.o.   MRN: [id number]  HPI Chris [name], Chris [name] 50yo M with HIV disease, IDDM, poorly controlled, OSA, obesity who is here for regular hiv visit. CD 4 count of 230/VL<20 on stribild-DRV regimen. He reports that he is noticing increasing early satiety, and occasional burning. He states that he symptoms improve occasionally with OTC for GERD. He also reports having nausea occasionally. He reports decreasing his smoking to 3 cigs per day, committed to stopping smoking.  Outpatient Encounter Prescriptions as of 12/27/2015  Medication Sig  . acetaminophen-codeine (TYLENOL #3) 300-30 MG tablet Take 1 tablet by mouth every 8 (eight) hours as needed for moderate pain.  [name] albuterol (PROVENTIL HFA;VENTOLIN HFA) 108 (90 Base) MCG/ACT inhaler Inhale 2 puffs into the lungs every 4 (four) hours as needed for wheezing or shortness of breath (((PLAN B))).  [name] albuterol (PROVENTIL) (5 MG/ML) 0.5% nebulizer solution Take 0.5 mLs (2.5 mg total) by nebulization every 4 (four) hours as needed for wheezing or shortness of breath (((PLAN C))).  . beclomethasone (QVAR) 80 MCG/ACT inhaler Inhale 2 puffs into the lungs 2 (two) times daily.  . Blood Glucose Monitoring Suppl (ACCU-CHEK AVIVA PLUS) W/DEVICE KIT 250 Test up to 4 times daily  . cetirizine (ZYRTEC) 10 MG tablet Take 1 tablet (10 mg total) by mouth daily.  . dapsone 100 MG tablet TAKE 1 TABLET BY MOUTH DAILY  . glucose blood test strip Use as instructed  . insulin glargine (LANTUS) 100 UNIT/ML injection Inject 0.2 mLs (20 Units total) into the skin at bedtime.  . Insulin Pen Needle 32G X 5 MM MISC Use as directed  . Insulin Syringe-Needle U-100 (BD INSULIN SYRINGE ULTRAFINE) 31G X 15/64" 0.5 ML MISC 1 each by Does not apply route at bedtime.  [name] lisinopril-hydrochlorothiazide (PRINZIDE,ZESTORETIC) 10-12.5 MG tablet Take 1 tablet by mouth daily.  .  metFORMIN (GLUCOPHAGE) 500 MG tablet Take 1 tablet (500 mg total) by mouth 2 (two) times daily with a meal.  . PREZISTA 800 MG tablet TAKE 1 TABLET BY MOUTH DAILY WITH BREAKFAST  . sildenafil (VIAGRA) 50 MG tablet Take 0.5 tablets (25 mg total) by mouth daily as needed for erectile dysfunction.  . STRIBILD 150-150-200-300 MG TABS tablet TAKE 1 TABLET BY MOUTH DAILY WITH BREAKFAST  . tiotropium (SPIRIVA HANDIHALER) 18 MCG inhalation capsule Place 1 capsule (18 mcg total) into inhaler and inhale every morning.  . varenicline (CHANTIX STARTING MONTH PAK) 0.5 MG X 11 & 1 MG X 42 tablet Take one 0.5 mg tablet by mouth once daily for 3 days, then increase to one 0.5 mg tablet twice daily for 4 days, then increase to one 1 mg tablet twice daily.   No facility-administered encounter medications on file as of 12/27/2015.     Patient Active Problem List   Diagnosis Date Noted  . Hoarseness of voice 10/04/2015  . AIDS (acquired immunodeficiency syndrome) ([place]) 10/04/2015  . Type 2 diabetes mellitus with complication, with long-term current use of insulin ([place]) 10/04/2015  . Cigarette nicotine dependence without complication 28/78/6767  . Obesity 09/04/2015  . Respiratory distress 08/14/2015  . Acute respiratory failure with hypoxia ([place]) 08/14/2015  . Primary osteoarthritis of right knee 03/26/2015  . Abscess of right breast 02/20/2015  . Genital warts 01/22/2015  . Other male erectile dysfunction 01/22/2015  . Type 2 diabetes mellitus with  complication ([place]) 43/15/4008  . Pneumonia 12/14/2014  . HCAP (healthcare-associated pneumonia) 12/07/2014  . Essential hypertension, benign 12/04/2014  . Atopic eczema 12/04/2014  . Dyspnea 12/12/2013  . Acute on chronic respiratory failure ([place]) 12/12/2013  . COPD exacerbation ([place]) 08/23/2013  . Cyst (solitary) of breast 08/23/2013  . Breast abscess 08/23/2013  . OSA (obstructive sleep apnea) 08/23/2013  . Smoker 02/17/2013  . Hyperglycemia 02/17/2013    . Uncontrolled diabetes mellitus ([place]) 02/17/2013  . DM (diabetes mellitus) ([place]) 01/27/2013  . Steroid-induced hyperglycemia 01/12/2013  . Chronic respiratory failure ([place]) 01/12/2013  . COPD  GOLD III, still smoking 01/11/2013  . Nicotine abuse 01/11/2013  . HIV disease ([place]) 12/06/2012     Health Maintenance Due  Topic Date Due  . FOOT EXAM  04/12/1976  . OPHTHALMOLOGY EXAM  04/12/1976  . TETANUS/TDAP  04/12/1985     Review of Systems Per hpi, also has left heel pain 10 point ros otherwise is negative, Physical Exam   BP 128/81 mmHg  Pulse 88  Temp(Src) 98.8 F (37.1 C) (Oral)  Ht '5\' 9"'  (1.753 m)  Wt 277 lb (125.646 kg)  BMI 40.89 kg/m2  SpO2 94% Physical Exam  Constitutional: He is oriented to person, place, and time. He appears well-developed and well-nourished. No distress.  HENT: wearing nasal cannula Mouth/Throat: Oropharynx is clear and moist. No oropharyngeal exudate.  Cardiovascular: Normal rate, regular rhythm and normal heart sounds. Exam reveals no gallop and no friction rub.  No murmur heard.  Pulmonary/Chest: Effort normal and breath sounds normal. No respiratory distress. He has no wheezes.  Abdominal: Soft. Bowel sounds are normal. He exhibits no distension. There is no tenderness.  Lymphadenopathy:  He has no cervical adenopathy.  Neurological: He is alert and oriented to person, place, and time.  Skin: Skin is warm and dry. No rash noted. No erythema.  Psychiatric: He has a normal mood and affect. His behavior is normal.    Lab Results  Component Value Date   CD4TCELL 13* 09/27/2015   Lab Results  Component Value Date   CD4TABS 230* 09/27/2015   CD4TABS 90* 08/14/2015   CD4TABS 340* 06/18/2015   Lab Results  Component Value Date   HIV1RNAQUANT <20 09/27/2015   Lab Results  Component Value Date   HEPBSAB REACTIVE* 12/06/2012   No results found for: RPR  CBC Lab Results  Component Value Date   WBC 5.0 09/27/2015   RBC 4.92  09/27/2015   HGB 14.1 09/27/2015   HCT 46.1 09/27/2015   PLT 212 09/27/2015   MCV 93.7 09/27/2015   MCH 28.7 09/27/2015   MCHC 30.6 09/27/2015   RDW 13.6 09/27/2015   LYMPHSABS 1.8 09/27/2015   MONOABS 0.6 09/27/2015   EOSABS 0.2 09/27/2015   BASOSABS 0.0 09/27/2015   BMET Lab Results  Component Value Date   NA 135 09/27/2015   K 4.6 09/27/2015   CL 98 09/27/2015   CO2 31 09/27/2015   GLUCOSE 254* 09/27/2015   BUN 6* 09/27/2015   CREATININE 0.78 09/27/2015   CALCIUM 8.8 09/27/2015   GFRNONAA >89 09/27/2015   GFRAA >89 09/27/2015     Assessment and Plan  hiv = well controlled, continue on stribild-DRV  Smoking cessation = he is nearly off of all smokes, currently on chantix, down to 3 cigs per day. Encouraged complete stop  osa = recommend pcp to refer back to pulmonary as part of annual check up   Dm, poorly controlled ? Possibly gastroparesis vs. gerd =  complains of sx suspicous for gastroparesis. Will do a trial of reglan to see if it helps his symptoms. Encouraged to keep with his current regimen and take BS recordings so that it can help his providers adjust his meds. Can continue to use otc medicaiton for GERD  Left heel pain = possibly plantar fasciitis. No ulcer. Defer to pcp for podiatry referral/ different shoes?

## 2015-12-27 NOTE — BH Specialist Note (Signed)
Counselor met with Chris [name] today for a warm hand off.  Patient was oriented times four with flat affect.  Patient talk somewhat but was cooperative.  Patient shard that he was interested in the support group that [facility] provides.  Counselor provided support and information as patient requested.  Patient stated that he would like to give back and support someone going what he went through during the time he was getting clean 13 years ago.  Counselor recommended that he come to support group and possibly apply for the mentor program. Patient agreed he would.   Rolena Infante, MA, LPC [facility]

## 2016-01-02 ENCOUNTER — Other Ambulatory Visit: Payer: Self-pay | Admitting: Pharmacist

## 2016-01-02 ENCOUNTER — Telehealth: Payer: Self-pay | Admitting: *Deleted

## 2016-01-02 DIAGNOSIS — J449 Chronic obstructive pulmonary disease, unspecified: Secondary | ICD-10-CM

## 2016-01-02 MED ORDER — ALBUTEROL SULFATE HFA 108 (90 BASE) MCG/ACT IN AERS: 2.0000 | INHALATION_SPRAY | RESPIRATORY_TRACT | Status: DC | PRN

## 2016-01-02 NOTE — Telephone Encounter (Signed)
Medication arrived from patient assistance, patient notified.  viagra 50mg  #30 lot [id number] exp 03/10/20 NDC [id number]

## 2016-01-03 ENCOUNTER — Ambulatory Visit: Payer: Medicare Other | Attending: Internal Medicine | Admitting: Internal Medicine

## 2016-01-03 ENCOUNTER — Encounter: Payer: Self-pay | Admitting: Internal Medicine

## 2016-01-03 VITALS — BP 121/74 | HR 103 | Temp 97.2°F | Resp 18 | Ht 69.0 in | Wt 275.8 lb

## 2016-01-03 DIAGNOSIS — Z794 Long term (current) use of insulin: Secondary | ICD-10-CM

## 2016-01-03 DIAGNOSIS — L84 Corns and callosities: Secondary | ICD-10-CM | POA: Diagnosis not present

## 2016-01-03 DIAGNOSIS — J449 Chronic obstructive pulmonary disease, unspecified: Secondary | ICD-10-CM | POA: Diagnosis not present

## 2016-01-03 DIAGNOSIS — Z7984 Long term (current) use of oral hypoglycemic drugs: Secondary | ICD-10-CM | POA: Insufficient documentation

## 2016-01-03 DIAGNOSIS — F4321 Adjustment disorder with depressed mood: Secondary | ICD-10-CM

## 2016-01-03 DIAGNOSIS — M1711 Unilateral primary osteoarthritis, right knee: Secondary | ICD-10-CM | POA: Diagnosis not present

## 2016-01-03 DIAGNOSIS — E118 Type 2 diabetes mellitus with unspecified complications: Secondary | ICD-10-CM | POA: Diagnosis not present

## 2016-01-03 DIAGNOSIS — Z79899 Other long term (current) drug therapy: Secondary | ICD-10-CM | POA: Diagnosis not present

## 2016-01-03 LAB — HEMOGLOBIN A1C
Hgb A1c MFr Bld: 8.5 % — ABNORMAL HIGH (ref <5.7)
Mean Plasma Glucose: 197 mg/dL

## 2016-01-03 LAB — GLUCOSE, POCT (MANUAL RESULT ENTRY): POC GLUCOSE: 238 mg/dL — AB (ref 70–99)

## 2016-01-03 MED ORDER — DULOXETINE HCL 20 MG PO CPEP: 20.0000 mg | ORAL_CAPSULE | Freq: Every day | ORAL | Status: DC

## 2016-01-03 MED ORDER — ACETAMINOPHEN-CODEINE #3 300-30 MG PO TABS: 1.0000 | ORAL_TABLET | Freq: Three times a day (TID) | ORAL | Status: DC | PRN

## 2016-01-03 MED ORDER — METFORMIN HCL 500 MG PO TABS: 500.0000 mg | ORAL_TABLET | Freq: Two times a day (BID) | ORAL | Status: DC

## 2016-01-03 MED ORDER — ACCU-CHEK SOFTCLIX LANCETS MISC: Status: DC

## 2016-01-03 MED ORDER — ALBUTEROL SULFATE HFA 108 (90 BASE) MCG/ACT IN AERS: 2.0000 | INHALATION_SPRAY | RESPIRATORY_TRACT | Status: DC | PRN

## 2016-01-03 NOTE — Progress Notes (Signed)
Patient ID: Chris [name], male   DOB: 11-21-1965, 50 y.o.   MRN: [id number]   Chris [name], is a 50 y.o. male  [id number]  [id number]  DOB - 11-18-1965  No chief complaint on file.       Subjective:   Chris [name] a 50 y.o. male with multiple medical history including COPD on home oxygen, obstructive sleep apnea, type 2 diabetes mellitus, HIV infection, erectile dysfunction, morbid obesity and ongoing nicotine abuse here today for a follow up visit and for medication refill. Patient is complaining of severe left heel pain that has been ongoing for quite a while but getting worse, patient had seen a podiatrist in the past but would like another evaluation. Patient continues to smoke cigarette but claimed he is reducing the quantity. He is also complaining of major depression because he feels incapacitated by his multiple medical conditions, he feels very tired of having to visit multiple specialists as well as being on so many medications and having to be short of breath at any physical activity. He is not able to play with his grandchildren as he would love to. Patient feels overwhelmed with his medical conditions and finding it difficult to adjust to his new status. He however denies any suicidal ideations or thoughts. He has not seen any psychiatrist or behavioral therapist. Patient has No headache, No chest pain, No abdominal pain - No Nausea, No new weakness tingling or numbness.  No problems updated.  ALLERGIES: Allergies  Allergen Reactions  . Bactrim [Sulfamethoxazole-Trimethoprim] Hives    PAST MEDICAL HISTORY: Past Medical History  Diagnosis Date  . COPD (chronic obstructive pulmonary disease) ([place])   . HIV disease ([place])   . Emphysema   . Diabetes mellitus without complication ([place])     MEDICATIONS AT HOME: Prior to Admission medications   Medication Sig Start Date End Date Taking? Authorizing Provider  ACCU-CHEK SOFTCLIX LANCETS lancets Use as instructed 01/03/16    Tresa Garter, MD  acetaminophen-codeine (TYLENOL #3) 300-30 MG tablet Take 1 tablet by mouth every 8 (eight) hours as needed for moderate pain. 01/03/16   Tresa Garter, MD  albuterol (PROVENTIL HFA;VENTOLIN HFA) 108 (90 Base) MCG/ACT inhaler Inhale 2 puffs into the lungs every 4 (four) hours as needed for wheezing or shortness of breath (((PLAN B))). 01/03/16   Tresa Garter, MD  albuterol (PROVENTIL) (5 MG/ML) 0.5% nebulizer solution Take 0.5 mLs (2.5 mg total) by nebulization every 4 (four) hours as needed for wheezing or shortness of breath (((PLAN C))). 07/18/15   Arnoldo Morale, MD  beclomethasone (QVAR) 80 MCG/ACT inhaler Inhale 2 puffs into the lungs 2 (two) times daily. 10/04/15   Tresa Garter, MD  Blood Glucose Monitoring Suppl (ACCU-CHEK AVIVA PLUS) W/DEVICE KIT 250 Test up to 4 times daily 04/28/14   Tresa Garter, MD  cetirizine (ZYRTEC) 10 MG tablet Take 1 tablet (10 mg total) by mouth daily. 10/04/15   Tresa Garter, MD  dapsone 100 MG tablet TAKE 1 TABLET BY MOUTH DAILY 11/27/15   Carlyle Basques, MD  DULoxetine (CYMBALTA) 20 MG capsule Take 1 capsule (20 mg total) by mouth daily. 01/03/16   Tresa Garter, MD  glucose blood test strip Use as instructed 04/20/14   Tresa Garter, MD  insulin glargine (LANTUS) 100 UNIT/ML injection Inject 0.2 mLs (20 Units total) into the skin at bedtime. 09/04/15   Arnoldo Morale, MD  Insulin Pen Needle 32G X 5 MM MISC Use as directed 06/12/14  Tresa Garter, MD  Insulin Syringe-Needle U-100 (BD INSULIN SYRINGE ULTRAFINE) 31G X 15/64" 0.5 ML MISC 1 each by Does not apply route at bedtime. 08/21/15   Arnoldo Morale, MD  lisinopril-hydrochlorothiazide (PRINZIDE,ZESTORETIC) 10-12.5 MG tablet Take 1 tablet by mouth daily. 10/04/15   Tresa Garter, MD  metFORMIN (GLUCOPHAGE) 500 MG tablet Take 1 tablet (500 mg total) by mouth 2 (two) times daily with a meal. 01/03/16   Tresa Garter, MD  metoCLOPramide (REGLAN)  5 MG tablet Take 1 tablet (5 mg total) by mouth 3 (three) times daily before meals. 12/27/15   Carlyle Basques, MD  PREZISTA 800 MG tablet TAKE 1 TABLET BY MOUTH DAILY WITH BREAKFAST 07/25/15   Carlyle Basques, MD  sildenafil (VIAGRA) 50 MG tablet Take 0.5 tablets (25 mg total) by mouth daily as needed for erectile dysfunction. 06/18/15   Carlyle Basques, MD  STRIBILD 150-150-200-300 MG TABS tablet TAKE 1 TABLET BY MOUTH DAILY WITH BREAKFAST 07/25/15   Carlyle Basques, MD  tiotropium (SPIRIVA HANDIHALER) 18 MCG inhalation capsule Place 1 capsule (18 mcg total) into inhaler and inhale every morning. 04/09/15   Lance Bosch, NP  varenicline (CHANTIX STARTING MONTH PAK) 0.5 MG X 11 & 1 MG X 42 tablet Take one 0.5 mg tablet by mouth once daily for 3 days, then increase to one 0.5 mg tablet twice daily for 4 days, then increase to one 1 mg tablet twice daily. 10/04/15   Tresa Garter, MD     Objective:   There were no vitals filed for this visit.  Exam General appearance : Awake, alert, not in any distress. Speech Clear. Not toxic looking, obese, Oxygen by [place] in situ HEENT: Atraumatic and Normocephalic, pupils equally reactive to light and accomodation Neck: supple, no JVD. No cervical lymphadenopathy.  Chest: Good air entry bilaterally, no added sounds  CVS: S1 S2 regular + ectopic beats, no murmurs.  Abdomen: Bowel sounds present, Non tender and not distended with no gaurding, rigidity or rebound. Extremities: B/L Lower Ext shows no edema, both legs are warm to touch. Multiple calluses on left foot, dry skin, hypertrophied nails Neurology: Awake alert, and oriented X 3, CN II-XII intact, Non focal Skin: No Rash  Data Review Lab Results  Component Value Date   HGBA1C 7.50 08/21/2015   HGBA1C 6.0 07/18/2015   HGBA1C 5.60 03/26/2015     Assessment & Plan   1. COPD mixed type (HCC)  - albuterol (PROVENTIL HFA;VENTOLIN HFA) 108 (90 Base) MCG/ACT inhaler; Inhale 2 puffs into the lungs  every 4 (four) hours as needed for wheezing or shortness of breath (((PLAN B))).  Dispense: 1 Inhaler; Refill: 2  Chris [name] was counseled on the dangers of tobacco use, and was advised to quit. Reviewed strategies to maximize success, including removing cigarettes and smoking materials from environment, stress management and support of family/friends.  2. Primary osteoarthritis of right knee  - acetaminophen-codeine (TYLENOL #3) 300-30 MG tablet; Take 1 tablet by mouth every 8 (eight) hours as needed for moderate pain.  Dispense: 90 tablet; Refill: 0  3. Type 2 diabetes mellitus with complication, with long-term current use of insulin (HCC)  - metFORMIN (GLUCOPHAGE) 500 MG tablet; Take 1 tablet (500 mg total) by mouth 2 (two) times daily with a meal.  Dispense: 180 tablet; Refill: 3  - Ambulatory referral to Podiatry - ACCU-CHEK SOFTCLIX LANCETS lancets; Use as instructed  Dispense: 100 each; Refill: 12  Aim for 30 minutes of exercise most days. Rethink  what you drink. Water is great! Aim for 2-3 Carb Choices per meal (30-45 grams) +/- 1 either way  Aim for 0-15 Carbs per snack if hungry  Include protein in moderation with your meals and snacks  Consider reading food labels for Total Carbohydrate and Fat Grams of foods  Consider checking BG at alternate times per day  Continue taking medication as directed Be mindful about how much sugar you are adding to beverages and other foods. Fruit Punch - find one with no sugar  Measure and decrease portions of carbohydrate foods  Make your plate and don't go back for seconds   4. Adjustment disorder with depressed mood  Start - DULoxetine (CYMBALTA) 20 MG capsule; Take 1 capsule (20 mg total) by mouth daily.  Dispense: 30 capsule; Refill: 3 - Ambulatory referral to Psychiatry and behavioral therapist  5. Callus of foot  - Ambulatory referral to Podiatry  Patient have been counseled extensively about nutrition and exercise  Return in  about 3 months (around 04/04/2016) for Hemoglobin A1C and Follow up, DM, Follow up HTN, Follow up Pain and comorbidities, Depression and An.  The patient was given clear instructions to go to ER or return to medical center if symptoms don't improve, worsen or new problems develop. The patient verbalized understanding. The patient was told to call to get lab results if they haven't heard anything in the next week.   This note has been created with Surveyor, quantity. Any transcriptional errors are unintentional.    Angelica Chessman, MD, [place], Karilyn Cota, [place] and [facility] [place], [place]   01/03/2016, 12:17 PM3

## 2016-01-03 NOTE — Progress Notes (Signed)
Patient is here for Personal concerns  Patient complains of incontinence and heel pain scaled currentyl at a 7.  Patient has taken medication and patient has eaten today.

## 2016-01-03 NOTE — Patient Instructions (Signed)
Diabetes and Foot Care Diabetes may cause you to have problems because of poor blood supply (circulation) to your feet and legs. This may cause the skin on your feet to become thinner, break easier, and heal more slowly. Your skin may become dry, and the skin may peel and crack. You may also have nerve damage in your legs and feet causing decreased feeling in them. You may not notice minor injuries to your feet that could lead to infections or more serious problems. Taking care of your feet is one of the most important things you can do for yourself.  HOME CARE INSTRUCTIONS  Wear shoes at all times, even in the house. Do not go barefoot. Bare feet are easily injured.  Check your feet daily for blisters, cuts, and redness. If you cannot see the bottom of your feet, use a mirror or ask someone for help.  Wash your feet with warm water (do not use hot water) and mild soap. Then pat your feet and the areas between your toes until they are completely dry. Do not soak your feet as this can dry your skin.  Apply a moisturizing lotion or petroleum jelly (that does not contain alcohol and is unscented) to the skin on your feet and to dry, brittle toenails. Do not apply lotion between your toes.  Trim your toenails straight across. Do not dig under them or around the cuticle. File the edges of your nails with an emery board or nail file.  Do not cut corns or calluses or try to remove them with medicine.  Wear clean socks or stockings every day. Make sure they are not too tight. Do not wear knee-high stockings since they may decrease blood flow to your legs.  Wear shoes that fit properly and have enough cushioning. To break in new shoes, wear them for just a few hours a day. This prevents you from injuring your feet. Always look in your shoes before you put them on to be sure there are no objects inside.  Do not cross your legs. This may decrease the blood flow to your feet.  If you find a minor scrape,  cut, or break in the skin on your feet, keep it and the skin around it clean and dry. These areas may be cleansed with mild soap and water. Do not cleanse the area with peroxide, alcohol, or iodine.  When you remove an adhesive bandage, be sure not to damage the skin around it.  If you have a wound, look at it several times a day to make sure it is healing.  Do not use heating pads or hot water bottles. They may burn your skin. If you have lost feeling in your feet or legs, you may not know it is happening until it is too late.  Make sure your health care provider performs a complete foot exam at least annually or more often if you have foot problems. Report any cuts, sores, or bruises to your health care provider immediately. SEEK MEDICAL CARE IF:   You have an injury that is not healing.  You have cuts or breaks in the skin.  You have an ingrown nail.  You notice redness on your legs or feet.  You feel burning or tingling in your legs or feet.  You have pain or cramps in your legs and feet.  Your legs or feet are numb.  Your feet always feel cold. SEEK IMMEDIATE MEDICAL CARE IF:   There is increasing redness,   swelling, or pain in or around a wound.  There is a red line that goes up your leg.  Pus is coming from a wound.  You develop a fever or as directed by your health care provider.  You notice a bad smell coming from an ulcer or wound.   This information is not intended to replace advice given to you by your health care provider. Make sure you discuss any questions you have with your health care provider.   Document Released: 07/25/2000 Document Revised: 03/30/2013 Document Reviewed: 01/04/2013 Elsevier Interactive Patient Education 2016 [employer] Basic Carbohydrate Counting for Diabetes Mellitus Carbohydrate counting is a method for keeping track of the amount of carbohydrates you eat. Eating carbohydrates naturally increases the level of sugar (glucose) in your  blood, so it is important for you to know the amount that is okay for you to have in every meal. Carbohydrate counting helps keep the level of glucose in your blood within normal limits. The amount of carbohydrates allowed is different for every person. A dietitian can help you calculate the amount that is right for you. Once you know the amount of carbohydrates you can have, you can count the carbohydrates in the foods you want to eat. Carbohydrates are found in the following foods:  Grains, such as breads and cereals.  Dried beans and soy products.  Starchy vegetables, such as potatoes, peas, and corn.  Fruit and fruit juices.  Milk and yogurt.  Sweets and snack foods, such as cake, cookies, candy, chips, soft drinks, and fruit drinks. CARBOHYDRATE COUNTING There are two ways to count the carbohydrates in your food. You can use either of the methods or a combination of both. Reading the "Nutrition Facts" on Packaged Food The "Nutrition Facts" is an area that is included on the labels of almost all packaged food and beverages in the [place]. It includes the serving size of that food or beverage and information about the nutrients in each serving of the food, including the grams (g) of carbohydrate per serving.  Decide the number of servings of this food or beverage that you will be able to eat or drink. Multiply that number of servings by the number of grams of carbohydrate that is listed on the label for that serving. The total will be the amount of carbohydrates you will be having when you eat or drink this food or beverage. Learning Standard Serving Sizes of Food When you eat food that is not packaged or does not include "Nutrition Facts" on the label, you need to measure the servings in order to count the amount of carbohydrates.A serving of most carbohydrate-rich foods contains about 15 g of carbohydrates. The following list includes serving sizes of carbohydrate-rich foods that  provide 15 g ofcarbohydrate per serving:   1 slice of bread (1 oz) or 1 six-inch tortilla.    of a hamburger bun or English muffin.  4-6 crackers.   cup unsweetened dry cereal.    cup hot cereal.   cup rice or pasta.    cup mashed potatoes or  of a large baked potato.  1 cup fresh fruit or one small piece of fruit.    cup canned or frozen fruit or fruit juice.  1 cup milk.   cup plain fat-free yogurt or yogurt sweetened with artificial sweeteners.   cup cooked dried beans or starchy vegetable, such as peas, corn, or potatoes.  Decide the number of standard-size servings that you will eat. Multiply that   number of servings by 15 (the grams of carbohydrates in that serving). For example, if you eat 2 cups of strawberries, you will have eaten 2 servings and 30 g of carbohydrates (2 servings x 15 g = 30 g). For foods such as soups and casseroles, in which more than one food is mixed in, you will need to count the carbohydrates in each food that is included. EXAMPLE OF CARBOHYDRATE COUNTING Sample Dinner  3 oz chicken breast.   cup of brown rice.   cup of corn.  1 cup milk.   1 cup strawberries with sugar-free whipped topping.  Carbohydrate Calculation Step 1: Identify the foods that contain carbohydrates:   Rice.   Corn.   Milk.   Strawberries. Step 2:Calculate the number of servings eaten of each:   2 servings of rice.   1 serving of corn.   1 serving of milk.   1 serving of strawberries. Step 3: Multiply each of those number of servings by 15 g:   2 servings of rice x 15 g = 30 g.   1 serving of corn x 15 g = 15 g.   1 serving of milk x 15 g = 15 g.   1 serving of strawberries x 15 g = 15 g. Step 4: Add together all of the amounts to find the total grams of carbohydrates eaten: 30 g + 15 g + 15 g + 15 g = 75 g.   This information is not intended to replace advice given to you by your health care provider. Make sure you  discuss any questions you have with your health care provider.   Document Released: 07/28/2005 Document Revised: 08/18/2014 Document Reviewed: 06/24/2013 Elsevier Interactive Patient Education 2016 [employer] Chronic Obstructive Pulmonary Disease Chronic obstructive pulmonary disease (COPD) is a common lung condition in which airflow from the lungs is limited. COPD is a general term that can be used to describe many different lung problems that limit airflow, including both chronic bronchitis and emphysema. If you have COPD, your lung function will probably never return to normal, but there are measures you can take to improve lung function and make yourself feel better. CAUSES   Smoking (common).  Exposure to secondhand smoke.  Genetic problems.  Chronic inflammatory lung diseases or recurrent infections. SYMPTOMS  Shortness of breath, especially with physical activity.  Deep, persistent (chronic) cough with a large amount of thick mucus.  Wheezing.  Rapid breaths (tachypnea).  Gray or bluish discoloration (cyanosis) of the skin, especially in your fingers, toes, or lips.  Fatigue.  Weight loss.  Frequent infections or episodes when breathing symptoms become much worse (exacerbations).  Chest tightness. DIAGNOSIS Your health care provider will take a medical history and perform a physical examination to diagnose COPD. Additional tests for COPD may include:  Lung (pulmonary) function tests.  Chest X-ray.  CT scan.  Blood tests. TREATMENT  Treatment for COPD may include:  Inhaler and nebulizer medicines. These help manage the symptoms of COPD and make your breathing more comfortable.  Supplemental oxygen. Supplemental oxygen is only helpful if you have a low oxygen level in your blood.  Exercise and physical activity. These are beneficial for nearly all people with COPD.  Lung surgery or transplant.  Nutrition therapy to gain weight, if you are  underweight.  Pulmonary rehabilitation. This may involve working with a team of health care providers and specialists, such as respiratory, occupational, and physical therapists. HOME CARE INSTRUCTIONS  Take all medicines (  inhaled or pills) as directed by your health care provider.  Avoid over-the-counter medicines or cough syrups that dry up your airway (such as antihistamines) and slow down the elimination of secretions unless instructed otherwise by your health care provider.  If you are a smoker, the most important thing that you can do is stop smoking. Continuing to smoke will cause further lung damage and breathing trouble. Ask your health care provider for help with quitting smoking. He or she can direct you to community resources or hospitals that provide support.  Avoid exposure to irritants such as smoke, chemicals, and fumes that aggravate your breathing.  Use oxygen therapy and pulmonary rehabilitation if directed by your health care provider. If you require home oxygen therapy, ask your health care provider whether you should purchase a pulse oximeter to measure your oxygen level at home.  Avoid contact with individuals who have a contagious illness.  Avoid extreme temperature and humidity changes.  Eat healthy foods. Eating smaller, more frequent meals and resting before meals may help you maintain your strength.  Stay active, but balance activity with periods of rest. Exercise and physical activity will help you maintain your ability to do things you want to do.  Preventing infection and hospitalization is very important when you have COPD. Make sure to receive all the vaccines your health care provider recommends, especially the pneumococcal and influenza vaccines. Ask your health care provider whether you need a pneumonia vaccine.  Learn and use relaxation techniques to manage stress.  Learn and use controlled breathing techniques as directed by your health care provider.  Controlled breathing techniques include:  Pursed lip breathing. Start by breathing in (inhaling) through your nose for 1 second. Then, purse your lips as if you were going to whistle and breathe out (exhale) through the pursed lips for 2 seconds.  Diaphragmatic breathing. Start by putting one hand on your abdomen just above your waist. Inhale slowly through your nose. The hand on your abdomen should move out. Then purse your lips and exhale slowly. You should be able to feel the hand on your abdomen moving in as you exhale.  Learn and use controlled coughing to clear mucus from your lungs. Controlled coughing is a series of short, progressive coughs. The steps of controlled coughing are:  Lean your head slightly forward.  Breathe in deeply using diaphragmatic breathing.  Try to hold your breath for 3 seconds.  Keep your mouth slightly open while coughing twice.  Spit any mucus out into a tissue.  Rest and repeat the steps once or twice as needed. SEEK MEDICAL CARE IF:  You are coughing up more mucus than usual.  There is a change in the color or thickness of your mucus.  Your breathing is more labored than usual.  Your breathing is faster than usual. SEEK IMMEDIATE MEDICAL CARE IF:  You have shortness of breath while you are resting.  You have shortness of breath that prevents you from:  Being able to talk.  Performing your usual physical activities.  You have chest pain lasting longer than 5 minutes.  Your skin color is more cyanotic than usual.  You measure low oxygen saturations for longer than 5 minutes with a pulse oximeter. MAKE SURE YOU:  Understand these instructions.  Will watch your condition.  Will get help right away if you are not doing well or get worse.   This information is not intended to replace advice given to you by your health care provider. Make  sure you discuss any questions you have with your health care provider.   Document Released:  05/07/2005 Document Revised: 08/18/2014 Document Reviewed: 03/24/2013 Elsevier Interactive Patient Education 2016 [employer] Major Depressive Disorder Major depressive disorder is a mental illness. It also may be called clinical depression or unipolar depression. Major depressive disorder usually causes feelings of sadness, hopelessness, or helplessness. Some people with this disorder do not feel particularly sad but lose interest in doing things they used to enjoy (anhedonia). Major depressive disorder also can cause physical symptoms. It can interfere with work, school, relationships, and other normal everyday activities. The disorder varies in severity but is longer lasting and more serious than the sadness we all feel from time to time in our lives. Major depressive disorder often is triggered by stressful life events or major life changes. Examples of these triggers include divorce, loss of your job or home, a move, and the death of a family member or close friend. Sometimes this disorder occurs for no obvious reason at all. People who have family members with major depressive disorder or bipolar disorder are at higher risk for developing this disorder, with or without life stressors. Major depressive disorder can occur at any age. It may occur just once in your life (single episode major depressive disorder). It may occur multiple times (recurrent major depressive disorder). SYMPTOMS People with major depressive disorder have either anhedonia or depressed mood on nearly a daily basis for at least 2 weeks or longer. Symptoms of depressed mood include:  Feelings of sadness (blue or down in the dumps) or emptiness.  Feelings of hopelessness or helplessness.  Tearfulness or episodes of crying (may be observed by others).  Irritability (children and adolescents). In addition to depressed mood or anhedonia or both, people with this disorder have at least four of the following  symptoms:  Difficulty sleeping or sleeping too much.   Significant change (increase or decrease) in appetite or weight.   Lack of energy or motivation.  Feelings of guilt and worthlessness.   Difficulty concentrating, remembering, or making decisions.  Unusually slow movement (psychomotor retardation) or restlessness (as observed by others).   Recurrent wishes for death, recurrent thoughts of self-harm (suicide), or a suicide attempt. People with major depressive disorder commonly have persistent negative thoughts about themselves, other people, and the world. People with severe major depressive disorder may experiencedistorted beliefs or perceptions about the world (psychotic delusions). They also may see or hear things that are not real (psychotic hallucinations). DIAGNOSIS Major depressive disorder is diagnosed through an assessment by your health care provider. Your health care provider will ask aboutaspects of your daily life, such as mood,sleep, and appetite, to see if you have the diagnostic symptoms of major depressive disorder. Your health care provider may ask about your medical history and use of alcohol or drugs, including prescription medicines. Your health care provider also may do a physical exam and blood work. This is because certain medical conditions and the use of certain substances can cause major depressive disorder-like symptoms (secondary depression). Your health care provider also may refer you to a mental health specialist for further evaluation and treatment. TREATMENT It is important to recognize the symptoms of major depressive disorder and seek treatment. The following treatments can be prescribed for this disorder:   Medicine. Antidepressant medicines usually are prescribed. Antidepressant medicines are thought to correct chemical imbalances in the brain that are commonly associated with major depressive disorder. Other types of medicine may be added if the  symptoms do not respond to antidepressant medicines alone or if psychotic delusions or hallucinations occur.  Talk therapy. Talk therapy can be helpful in treating major depressive disorder by providing support, education, and guidance. Certain types of talk therapy also can help with negative thinking (cognitive behavioral therapy) and with relationship issues that trigger this disorder (interpersonal therapy). A mental health specialist can help determine which treatment is best for you. Most people with major depressive disorder do well with a combination of medicine and talk therapy. Treatments involving electrical stimulation of the brain can be used in situations with extremely severe symptoms or when medicine and talk therapy do not work over time. These treatments include electroconvulsive therapy, transcranial magnetic stimulation, and vagal nerve stimulation.   This information is not intended to replace advice given to you by your health care provider. Make sure you discuss any questions you have with your health care provider.   Document Released: 11/22/2012 Document Revised: 08/18/2014 Document Reviewed: 11/22/2012 Elsevier Interactive Patient Education [employer].

## 2016-01-04 DIAGNOSIS — F4321 Adjustment disorder with depressed mood: Secondary | ICD-10-CM | POA: Insufficient documentation

## 2016-01-04 DIAGNOSIS — L84 Corns and callosities: Secondary | ICD-10-CM | POA: Insufficient documentation

## 2016-01-04 LAB — MICROALBUMIN / CREATININE URINE RATIO
CREATININE, URINE: 274 mg/dL (ref 20–370)
Microalb Creat Ratio: 23 mcg/mg creat (ref <30)
Microalb, Ur: 6.4 mg/dL

## 2016-01-17 ENCOUNTER — Encounter (HOSPITAL_COMMUNITY): Payer: Self-pay | Admitting: Emergency Medicine

## 2016-01-17 ENCOUNTER — Observation Stay (HOSPITAL_COMMUNITY)
Admission: EM | Admit: 2016-01-17 | Discharge: 2016-01-18 | Disposition: A | Discharge status: Home / Self Care | Payer: Medicare Other | Attending: Family Medicine | Admitting: Family Medicine

## 2016-01-17 ENCOUNTER — Emergency Department (HOSPITAL_COMMUNITY): Payer: Medicare Other

## 2016-01-17 DIAGNOSIS — Z21 Asymptomatic human immunodeficiency virus [HIV] infection status: Secondary | ICD-10-CM | POA: Insufficient documentation

## 2016-01-17 DIAGNOSIS — F17210 Nicotine dependence, cigarettes, uncomplicated: Secondary | ICD-10-CM | POA: Diagnosis not present

## 2016-01-17 DIAGNOSIS — J441 Chronic obstructive pulmonary disease with (acute) exacerbation: Secondary | ICD-10-CM | POA: Diagnosis not present

## 2016-01-17 DIAGNOSIS — E119 Type 2 diabetes mellitus without complications: Secondary | ICD-10-CM | POA: Insufficient documentation

## 2016-01-17 DIAGNOSIS — E669 Obesity, unspecified: Secondary | ICD-10-CM | POA: Diagnosis not present

## 2016-01-17 DIAGNOSIS — G4733 Obstructive sleep apnea (adult) (pediatric): Secondary | ICD-10-CM | POA: Insufficient documentation

## 2016-01-17 DIAGNOSIS — R0902 Hypoxemia: Secondary | ICD-10-CM | POA: Diagnosis not present

## 2016-01-17 DIAGNOSIS — Z79899 Other long term (current) drug therapy: Secondary | ICD-10-CM | POA: Diagnosis not present

## 2016-01-17 DIAGNOSIS — Z7984 Long term (current) use of oral hypoglycemic drugs: Secondary | ICD-10-CM | POA: Diagnosis not present

## 2016-01-17 DIAGNOSIS — Z9981 Dependence on supplemental oxygen: Secondary | ICD-10-CM | POA: Insufficient documentation

## 2016-01-17 DIAGNOSIS — R0602 Shortness of breath: Secondary | ICD-10-CM | POA: Diagnosis not present

## 2016-01-17 LAB — CBC
HEMATOCRIT: 50.6 % (ref 39.0–52.0)
Hemoglobin: 15.8 g/dL (ref 13.0–17.0)
MCH: 29.1 pg (ref 26.0–34.0)
MCHC: 31.2 g/dL (ref 30.0–36.0)
MCV: 93.2 fL (ref 78.0–100.0)
Platelets: 88 10*3/uL — ABNORMAL LOW (ref 150–400)
RBC: 5.43 MIL/uL (ref 4.22–5.81)
RDW: 13.3 % (ref 11.5–15.5)
WBC: 8.5 10*3/uL (ref 4.0–10.5)

## 2016-01-17 LAB — BASIC METABOLIC PANEL
Anion gap: 5 (ref 5–15)
CHLORIDE: 98 mmol/L — AB (ref 101–111)
CO2: 31 mmol/L (ref 22–32)
Calcium: 9.3 mg/dL (ref 8.9–10.3)
Creatinine, Ser: 0.75 mg/dL (ref 0.61–1.24)
GFR calc Af Amer: >60 mL/min (ref >60)
GFR calc non Af Amer: >60 mL/min (ref >60)
GLUCOSE: 134 mg/dL — AB (ref 65–99)
POTASSIUM: 4.2 mmol/L (ref 3.5–5.1)
Sodium: 134 mmol/L — ABNORMAL LOW (ref 135–145)

## 2016-01-17 LAB — I-STAT TROPONIN, ED: Troponin i, poc: 0.01 ng/mL (ref 0.00–0.08)

## 2016-01-17 MED ORDER — IPRATROPIUM-ALBUTEROL 0.5-2.5 (3) MG/3ML IN SOLN
3.0000 mL | Freq: Once | RESPIRATORY_TRACT | Status: AC
  Administered 2016-01-17: 3 mL via RESPIRATORY_TRACT
  Filled 2016-01-17: qty 3

## 2016-01-17 MED ORDER — METHYLPREDNISOLONE SODIUM SUCC 125 MG IJ SOLR
125.0000 mg | Freq: Once | INTRAMUSCULAR | Status: AC
  Administered 2016-01-17: 125 mg via INTRAVENOUS
  Filled 2016-01-17: qty 2

## 2016-01-17 MED ORDER — AZITHROMYCIN 250 MG PO TABS
500.0000 mg | ORAL_TABLET | Freq: Once | ORAL | Status: AC
  Administered 2016-01-18: 500 mg via ORAL
  Filled 2016-01-17: qty 2

## 2016-01-17 NOTE — ED Notes (Signed)
Pt states he has been having shortness of breath for 3 days but worse today. Pt normally wears 2L of oxygen everyday. Pt today feels short of breath on his oxygen. Pt states he just feels weak all over, pt denies any chest pain.

## 2016-01-17 NOTE — ED Notes (Signed)
Ambulated pt throughout hallway.Pt O2 level was 79.

## 2016-01-17 NOTE — ED Notes (Signed)
Pt sitting up in chair talking alert and oriented. No acute distress noted.
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
## 2016-01-17 NOTE — ED Notes (Signed)
Reports he has been "tired and weak, not wanting to do anything."

## 2016-01-18 DIAGNOSIS — J441 Chronic obstructive pulmonary disease with (acute) exacerbation: Secondary | ICD-10-CM | POA: Diagnosis not present

## 2016-01-18 DIAGNOSIS — R0902 Hypoxemia: Secondary | ICD-10-CM | POA: Diagnosis not present

## 2016-01-18 LAB — GLUCOSE, CAPILLARY
Glucose-Capillary: 331 mg/dL — ABNORMAL HIGH (ref 65–99)
Glucose-Capillary: 394 mg/dL — ABNORMAL HIGH (ref 65–99)

## 2016-01-18 MED ORDER — LISINOPRIL 10 MG PO TABS
10.0000 mg | ORAL_TABLET | Freq: Every day | ORAL | Status: DC
  Administered 2016-01-18: 10 mg via ORAL
  Filled 2016-01-18: qty 1

## 2016-01-18 MED ORDER — AZITHROMYCIN 250 MG PO TABS: 500.0000 mg | ORAL_TABLET | Freq: Every day | ORAL | Status: DC

## 2016-01-18 MED ORDER — ACETAMINOPHEN-CODEINE #3 300-30 MG PO TABS
1.0000 | ORAL_TABLET | Freq: Three times a day (TID) | ORAL | Status: DC | PRN
  Administered 2016-01-18: 1 via ORAL
  Filled 2016-01-18: qty 1

## 2016-01-18 MED ORDER — INSULIN ASPART 100 UNIT/ML ~~LOC~~ SOLN
8.0000 [IU] | Freq: Once | SUBCUTANEOUS | Status: AC
  Administered 2016-01-18: 8 [IU] via SUBCUTANEOUS

## 2016-01-18 MED ORDER — LISINOPRIL-HYDROCHLOROTHIAZIDE 10-12.5 MG PO TABS: 1.0000 | ORAL_TABLET | Freq: Every day | ORAL | Status: DC

## 2016-01-18 MED ORDER — PREDNISONE 20 MG PO TABS: 50.0000 mg | ORAL_TABLET | Freq: Every day | ORAL | Status: DC

## 2016-01-18 MED ORDER — TIOTROPIUM BROMIDE MONOHYDRATE 18 MCG IN CAPS
18.0000 ug | ORAL_CAPSULE | Freq: Every day | RESPIRATORY_TRACT | Status: DC
  Administered 2016-01-18: 18 ug via RESPIRATORY_TRACT
  Filled 2016-01-18: qty 5

## 2016-01-18 MED ORDER — AZITHROMYCIN 500 MG PO TABS: ORAL_TABLET | ORAL | Status: DC

## 2016-01-18 MED ORDER — ELVITEG-COBIC-EMTRICIT-TENOFAF 150-150-200-10 MG PO TABS
1.0000 | ORAL_TABLET | Freq: Every day | ORAL | Status: DC
  Administered 2016-01-18: 1 via ORAL
  Filled 2016-01-18: qty 1

## 2016-01-18 MED ORDER — INSULIN GLARGINE 100 UNIT/ML ~~LOC~~ SOLN
20.0000 [IU] | Freq: Every day | SUBCUTANEOUS | Status: DC
  Administered 2016-01-18: 20 [IU] via SUBCUTANEOUS
  Filled 2016-01-18 (×2): qty 0.2

## 2016-01-18 MED ORDER — LORATADINE 10 MG PO TABS: 10.0000 mg | ORAL_TABLET | Freq: Every day | ORAL | Status: DC

## 2016-01-18 MED ORDER — PIPERACILLIN-TAZOBACTAM 3.375 G IVPB
3.3750 g | Freq: Three times a day (TID) | INTRAVENOUS | Status: DC
  Filled 2016-01-18: qty 50

## 2016-01-18 MED ORDER — PANTOPRAZOLE SODIUM 40 MG PO TBEC
40.0000 mg | DELAYED_RELEASE_TABLET | Freq: Every day | ORAL | Status: DC
  Administered 2016-01-18: 40 mg via ORAL
  Filled 2016-01-18: qty 1

## 2016-01-18 MED ORDER — ALBUTEROL SULFATE (2.5 MG/3ML) 0.083% IN NEBU
2.5000 mg | INHALATION_SOLUTION | RESPIRATORY_TRACT | Status: DC
  Administered 2016-01-18 (×3): 2.5 mg via RESPIRATORY_TRACT
  Filled 2016-01-18 (×3): qty 3

## 2016-01-18 MED ORDER — BECLOMETHASONE DIPROPIONATE 80 MCG/ACT IN AERS
2.0000 | INHALATION_SPRAY | Freq: Two times a day (BID) | RESPIRATORY_TRACT | Status: DC
Start: 2016-01-18 — End: 2016-01-18
  Administered 2016-01-18: 2 via RESPIRATORY_TRACT
  Filled 2016-01-18: qty 8.7

## 2016-01-18 MED ORDER — ATOVAQUONE 750 MG/5ML PO SUSP
750.0000 mg | Freq: Two times a day (BID) | ORAL | Status: DC
  Administered 2016-01-18: 750 mg via ORAL
  Filled 2016-01-18 (×2): qty 5

## 2016-01-18 MED ORDER — METOCLOPRAMIDE HCL 10 MG PO TABS
5.0000 mg | ORAL_TABLET | Freq: Three times a day (TID) | ORAL | Status: DC
  Administered 2016-01-18: 5 mg via ORAL
  Filled 2016-01-18: qty 1

## 2016-01-18 MED ORDER — HYDROCHLOROTHIAZIDE 12.5 MG PO CAPS
12.5000 mg | ORAL_CAPSULE | Freq: Every day | ORAL | Status: DC
  Administered 2016-01-18: 12.5 mg via ORAL
  Filled 2016-01-18: qty 1

## 2016-01-18 MED ORDER — DARUNAVIR ETHANOLATE 800 MG PO TABS
800.0000 mg | ORAL_TABLET | Freq: Every day | ORAL | Status: DC
  Administered 2016-01-18: 800 mg via ORAL
  Filled 2016-01-18: qty 1

## 2016-01-18 MED ORDER — INSULIN ASPART 100 UNIT/ML ~~LOC~~ SOLN
0.0000 [IU] | Freq: Three times a day (TID) | SUBCUTANEOUS | Status: DC
  Administered 2016-01-18: 9 [IU] via SUBCUTANEOUS

## 2016-01-18 MED ORDER — PREDNISONE 50 MG PO TABS: 50.0000 mg | ORAL_TABLET | Freq: Every day | ORAL | Status: DC

## 2016-01-18 MED ORDER — VANCOMYCIN HCL 10 G IV SOLR
1250.0000 mg | Freq: Three times a day (TID) | INTRAVENOUS | Status: DC
  Filled 2016-01-18: qty 1250

## 2016-01-18 NOTE — Progress Notes (Signed)
Pharmacy Antibiotic Note  Albesa [name] a 50 y.o. male admitted on 01/17/2016 with Shortness of breath.  Pharmacy has been consulted for Vancomycin/Zosyn dosing. WBC WNL. Renal function good.   Plan: -Vancomycin 1250 mg IV q8h -Zosyn 3.375G IV q8h to be infused over 4 hours -Trend WBC, temp, renal function  -Drug levels as indicated    Temp (24hrs), Avg:98.4 F (36.9 C), Min:97.9 F (36.6 C), Max:98.9 F (37.2 C)   Recent Labs Lab 01/17/16 1434  WBC 8.5  CREATININE 0.75    CrCl cannot be calculated (Unknown ideal weight.).    Allergies  Allergen Reactions  . Bactrim [Sulfamethoxazole-Trimethoprim] Hives    Chris [name], Chris [name] 01/18/2016 12:43 AM
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
## 2016-01-18 NOTE — Progress Notes (Signed)
Pt discharge education and instructions completed with pt and spouse at bedside; both voices understanding and denies any questions. Pt IV and telemetry removed; pt handed his prescriptions for azithromycin and prednisone. Pt discharge home with spouse to transport him home. Pt home oxygen tank replaced and delivered to pt at bedside as pt oxygen tank was empty. Pt transported off unit via wheelchair with belongings to the side. Dionne BucyP. Amo Baya Lentz RN

## 2016-01-18 NOTE — Discharge Summary (Signed)
Physician Discharge Summary  Chris [name] JKD:[id number] DOB: 07-12-1966 DOA: 01/17/2016  PCP: Angelica Chessman, MD  Admit date: 01/17/2016 Discharge date: 01/18/2016  Time spent: > 35 minutes  Recommendations for Outpatient Follow-up:  1. Please decide whether or not patient will require a more prolonged steroid regimen   Discharge Diagnoses:  Active Problems:   Copd exacerbation   Discharge Condition: stable  Diet recommendation: carb modified diet  Filed Weights   01/18/16 0104  Weight: 122.471 kg (270 lb)    History of present illness:  50 year old male with history of DM, HIV, COPD on home oxygen, OSA, ED, obesity who came with cc of dyspnea that started 4 days ago and has been getting worse associated with cough productive of yellowish sputum but no chest pain. No fever or chills. No preceding upper respiratory symptoms. He has mild generalized abdominal pain but no nausea, vomiting, diarrhea or constipation. No other complaints.   Hospital Course:  COPD exacerbation - Improved with steroid and antibiotic administration - Patient much improved and requesting discharge. He has no new complaints. - Continue prednisone for 4 more days to complete a 5 day treatment course. - Should patient's condition worsen he is to go get a reevaluation with his pcp, urgent care, or ED. Patient verbalizes understanding and agreement. - He assures me that he has oxygen at home.  For other known medical conditions patient is to continue home medication regimen prior to admission  Procedures:  None  Consultations:  None  Discharge Exam: Filed Vitals:   01/18/16 0111 01/18/16 0554  BP: 154/70 131/68  Pulse: 108 99  Temp:  97.7 F (36.5 C)  Resp: 20 18    General: Pt in NAD, alert and awake Cardiovascular: rrr, no rubs Respiratory: equal chest rise, no rhales, speaking in full sentences  Discharge Instructions   Discharge Instructions    Call MD for:  difficulty breathing,  headache or visual disturbances    Complete by:  As directed      Call MD for:  persistant dizziness or light-headedness    Complete by:  As directed      Call MD for:  temperature >100.4    Complete by:  As directed      Diet - low sodium heart healthy    Complete by:  As directed      Discharge instructions    Complete by:  As directed   Please follow up with your pcp within the next 1 week after discharge.     Increase activity slowly    Complete by:  As directed           Current Discharge Medication List    START taking these medications   Details  azithromycin (ZITHROMAX) 500 MG tablet Take one tab orally daily for the next 4 days Qty: 4 tablet, Refills: 0    predniSONE (DELTASONE) 50 MG tablet Take 1 tablet (50 mg total) by mouth daily with breakfast. Qty: 4 tablet, Refills: 0      CONTINUE these medications which have NOT CHANGED   Details  ACCU-CHEK SOFTCLIX LANCETS lancets Use as instructed Qty: 100 each, Refills: 12   Associated Diagnoses: Type 2 diabetes mellitus with complication, with long-term current use of insulin (HCC)    acetaminophen-codeine (TYLENOL #3) 300-30 MG tablet Take 1 tablet by mouth every 8 (eight) hours as needed for moderate pain. Qty: 90 tablet, Refills: 0   Associated Diagnoses: Primary osteoarthritis of right knee    albuterol (  PROVENTIL HFA;VENTOLIN HFA) 108 (90 Base) MCG/ACT inhaler Inhale 2 puffs into the lungs every 4 (four) hours as needed for wheezing or shortness of breath (((PLAN B))). Qty: 1 Inhaler, Refills: 2   Associated Diagnoses: COPD mixed type (HCC)    albuterol (PROVENTIL) (5 MG/ML) 0.5% nebulizer solution Take 0.5 mLs (2.5 mg total) by nebulization every 4 (four) hours as needed for wheezing or shortness of breath (((PLAN C))). Qty: 20 mL, Refills: 1    beclomethasone (QVAR) 80 MCG/ACT inhaler Inhale 2 puffs into the lungs 2 (two) times daily. Qty: 3 Inhaler, Refills: 3   Associated Diagnoses: COPD mixed type (HCC)     Blood Glucose Monitoring Suppl (ACCU-CHEK AVIVA PLUS) W/DEVICE KIT 250 Test up to 4 times daily Qty: 1 kit, Refills: 0    glucose blood test strip Use as instructed Qty: 100 each, Refills: 12   Associated Diagnoses: Type 2 diabetes mellitus with hyperglycemia (HCC)    insulin glargine (LANTUS) 100 UNIT/ML injection Inject 0.2 mLs (20 Units total) into the skin at bedtime. Qty: 10 mL, Refills: 3   Associated Diagnoses: Type 2 diabetes mellitus with other skin complication, with long-term current use of insulin (HCC)    Insulin Pen Needle 32G X 5 MM MISC Use as directed Qty: 100 each, Refills: 11    Insulin Syringe-Needle U-100 (BD INSULIN SYRINGE ULTRAFINE) 31G X 15/64" 0.5 ML MISC 1 each by Does not apply route at bedtime. Qty: 30 each, Refills: 5   Associated Diagnoses: Type 2 diabetes mellitus with other skin complication, with long-term current use of insulin (HCC)    metFORMIN (GLUCOPHAGE) 500 MG tablet Take 1 tablet (500 mg total) by mouth 2 (two) times daily with a meal. Qty: 180 tablet, Refills: 3   Associated Diagnoses: Type 2 diabetes mellitus with complication, with long-term current use of insulin (HCC)    PREZISTA 800 MG tablet TAKE 1 TABLET BY MOUTH DAILY WITH BREAKFAST Qty: 30 tablet, Refills: 5   Associated Diagnoses: HIV disease (HCC)    STRIBILD 150-150-200-300 MG TABS tablet TAKE 1 TABLET BY MOUTH DAILY WITH BREAKFAST Qty: 30 tablet, Refills: 5   Associated Diagnoses: HIV disease (HCC)    tiotropium (SPIRIVA HANDIHALER) 18 MCG inhalation capsule Place 1 capsule (18 mcg total) into inhaler and inhale every morning. Qty: 30 capsule, Refills: 12    cetirizine (ZYRTEC) 10 MG tablet Take 1 tablet (10 mg total) by mouth daily. Qty: 30 tablet, Refills: 11   Associated Diagnoses: Hoarseness of voice    dapsone 100 MG tablet TAKE 1 TABLET BY MOUTH DAILY Qty: 30 tablet, Refills: 5    lisinopril-hydrochlorothiazide (PRINZIDE,ZESTORETIC) 10-12.5 MG tablet Take 1  tablet by mouth daily. Qty: 90 tablet, Refills: 3   Associated Diagnoses: Essential hypertension, benign    metoCLOPramide (REGLAN) 5 MG tablet Take 1 tablet (5 mg total) by mouth 3 (three) times daily before meals. Qty: 90 tablet, Refills: 0   Associated Diagnoses: Gastroparesis    sildenafil (VIAGRA) 50 MG tablet Take 0.5 tablets (25 mg total) by mouth daily as needed for erectile dysfunction. Qty: 30 tablet, Refills: 5   Associated Diagnoses: Erectile dysfunction due to diseases classified elsewhere       Allergies  Allergen Reactions  . Bactrim [Sulfamethoxazole-Trimethoprim] Hives      The results of significant diagnostics from this hospitalization (including imaging, microbiology, ancillary and laboratory) are listed below for reference.    Significant Diagnostic Studies: Dg Chest 2 View  01/17/2016  CLINICAL DATA:  Shortness of  breath for 3 days, worsening today. History of HIV, COPD, emphysema, diabetes.  Smoker. EXAM: CHEST  2 VIEW COMPARISON:  Chest x-rays dated 08/13/2015, 07/16/2015 and 12/06/2014. FINDINGS: Mild cardiomegaly is stable. Lungs are at least mildly hyperexpanded compatible with the given history of COPD/ emphysema. Suspect associated chronic bronchitic changes centrally. Coarse interstitial lung markings appear stable compared to multiple prior studies suggesting some degree of associated chronic interstitial lung disease. No new lung findings. No evidence of pneumonia. No pleural effusion or pneumothorax seen. Osseous structures about the chest are unremarkable. IMPRESSION: 1. Lungs at least mildly hyperexpanded, compatible with given history of COPD/emphysema. Suspect associated chronic bronchitic changes centrally and chronic interstitial lung disease. 2. No acute findings.  No evidence of pneumonia. 3. Stable mild cardiomegaly.  No evidence of active CHF. Electronically Signed   By: Franki Cabot M.D.   On: 01/17/2016 15:37    Microbiology: No results found  for this or any previous visit (from the past 240 hour(s)).   Labs: Basic Metabolic Panel:  Recent Labs Lab 01/17/16 1434  NA 134*  K 4.2  CL 98*  CO2 31  GLUCOSE 134*  BUN <5*  CREATININE 0.75  CALCIUM 9.3   Liver Function Tests: No results for input(s): AST, ALT, ALKPHOS, BILITOT, PROT, ALBUMIN in the last 168 hours. No results for input(s): LIPASE, AMYLASE in the last 168 hours. No results for input(s): AMMONIA in the last 168 hours. CBC:  Recent Labs Lab 01/17/16 1434  WBC 8.5  HGB 15.8  HCT 50.6  MCV 93.2  PLT 88*   Cardiac Enzymes: No results for input(s): CKTOTAL, CKMB, CKMBINDEX, TROPONINI in the last 168 hours. BNP: BNP (last 3 results)  Recent Labs  08/14/15 0537  BNP 21.5    ProBNP (last 3 results) No results for input(s): PROBNP in the last 8760 hours.  CBG:  Recent Labs Lab 01/18/16 0119 01/18/16 0554  GLUCAP 331* 394*    Signed:  Velvet Bathe MD.  [facility] 01/18/2016, 11:04 AM

## 2016-01-18 NOTE — Hospital Discharge Follow-Up (Signed)
Transitional Care Clinic:  Patient known to the [facility] and [facility]. PCP: Dr. Doreene Burke. Hospitalized for COPD exacerbation-on home oxygen. Also has a home nebulizer. This Case Manager met with patient prior to discharge to determine plans for follow-up after discharge and to determine if patient interested in follow-up with the [facility] once again. Patient indicated he was interested in [facility] follow-up. Appointment scheduled for 01/24/16 at 1045 with Dr. Jarold Song. Patient indicated he will have transportation to his upcoming appointment. Patient reminded he will receive a post-discharge follow-up phone call from [facility]. Patient verbalized understanding. Ricki Miller, RN CM updated.

## 2016-01-18 NOTE — Care Management Obs Status (Signed)
MEDICARE OBSERVATION STATUS NOTIFICATION   Patient Details  Name: Chris [name] MRN: [id number] Date of Birth: 03/17/1966   Medicare Observation Status Notification Given:  Yes    Durenda GuthrieBrady, Chessie Neuharth Naomi, RN 01/18/2016, 12:48 PM

## 2016-01-21 ENCOUNTER — Telehealth: Payer: Self-pay

## 2016-01-21 NOTE — Telephone Encounter (Signed)
Transitional Care Clinic Post-discharge Follow-Up Phone Call: Attempt # 1  Date of Discharge:01/18/2016 Principal Discharge Diagnosis(es): COPD exacerbation. DM, HIV, OSA Post-discharge Communication: Call placed to # [phone] (M) and a HIPAA compliant voicemail message was left requesting a call back to # [phone] or [phone].  Call Completed: No

## 2016-01-22 ENCOUNTER — Telehealth: Payer: Self-pay

## 2016-01-22 ENCOUNTER — Ambulatory Visit (INDEPENDENT_AMBULATORY_CARE_PROVIDER_SITE_OTHER): Payer: Medicare Other | Admitting: Podiatry

## 2016-01-22 ENCOUNTER — Encounter: Payer: Self-pay | Admitting: Podiatry

## 2016-01-22 VITALS — BP 109/69 | HR 72 | Resp 12

## 2016-01-22 DIAGNOSIS — M79676 Pain in unspecified toe(s): Secondary | ICD-10-CM | POA: Diagnosis not present

## 2016-01-22 DIAGNOSIS — B351 Tinea unguium: Secondary | ICD-10-CM

## 2016-01-22 NOTE — Telephone Encounter (Signed)
Transitional Care Clinic Post-discharge Follow-Up Phone Call:  Attempt #2  Date of Discharge: 01/18/2016 Principal Discharge Diagnosis(es): COPD exacerbation, DM, HIV,OSA Post-discharge Communication: (Clearly document all attempts clearly and date contact made)  Call placed to the patient # [phone] and a HIPAA compliant voicemail message was left requesting a call back to # [phone] or [phone].  Call Completed: No

## 2016-01-22 NOTE — Patient Instructions (Signed)
Diabetes and Foot Care Diabetes may cause you to have problems because of poor blood supply (circulation) to your feet and legs. This may cause the skin on your feet to become thinner, break easier, and heal more slowly. Your skin may become dry, and the skin may peel and crack. You may also have nerve damage in your legs and feet causing decreased feeling in them. You may not notice minor injuries to your feet that could lead to infections or more serious problems. Taking care of your feet is one of the most important things you can do for yourself.  HOME CARE INSTRUCTIONS  Wear shoes at all times, even in the house. Do not go barefoot. Bare feet are easily injured.  Check your feet daily for blisters, cuts, and redness. If you cannot see the bottom of your feet, use a mirror or ask someone for help.  Wash your feet with warm water (do not use hot water) and mild soap. Then pat your feet and the areas between your toes until they are completely dry. Do not soak your feet as this can dry your skin.  Apply a moisturizing lotion or petroleum jelly (that does not contain alcohol and is unscented) to the skin on your feet and to dry, brittle toenails. Do not apply lotion between your toes.  Trim your toenails straight across. Do not dig under them or around the cuticle. File the edges of your nails with an emery board or nail file.  Do not cut corns or calluses or try to remove them with medicine.  Wear clean socks or stockings every day. Make sure they are not too tight. Do not wear knee-high stockings since they may decrease blood flow to your legs.  Wear shoes that fit properly and have enough cushioning. To break in new shoes, wear them for just a few hours a day. This prevents you from injuring your feet. Always look in your shoes before you put them on to be sure there are no objects inside.  Do not cross your legs. This may decrease the blood flow to your feet.  If you find a minor scrape,  cut, or break in the skin on your feet, keep it and the skin around it clean and dry. These areas may be cleansed with mild soap and water. Do not cleanse the area with peroxide, alcohol, or iodine.  When you remove an adhesive bandage, be sure not to damage the skin around it.  If you have a wound, look at it several times a day to make sure it is healing.  Do not use heating pads or hot water bottles. They may burn your skin. If you have lost feeling in your feet or legs, you may not know it is happening until it is too late.  Make sure your health care provider performs a complete foot exam at least annually or more often if you have foot problems. Report any cuts, sores, or bruises to your health care provider immediately. SEEK MEDICAL CARE IF:   You have an injury that is not healing.  You have cuts or breaks in the skin.  You have an ingrown nail.  You notice redness on your legs or feet.  You feel burning or tingling in your legs or feet.  You have pain or cramps in your legs and feet.  Your legs or feet are numb.  Your feet always feel cold. SEEK IMMEDIATE MEDICAL CARE IF:   There is increasing redness,   swelling, or pain in or around a wound.  There is a red line that goes up your leg.  Pus is coming from a wound.  You develop a fever or as directed by your health care provider.  You notice a bad smell coming from an ulcer or wound.   This information is not intended to replace advice given to you by your health care provider. Make sure you discuss any questions you have with your health care provider.   Document Released: 07/25/2000 Document Revised: 03/30/2013 Document Reviewed: 01/04/2013 Elsevier Interactive Patient Education 2016 [employer]  

## 2016-01-22 NOTE — Progress Notes (Signed)
   Subjective:    Patient ID: Chris [name], male    DOB: 09/20/1965, 50 y.o.   MRN: [id number]  HPI This patient presents today complaining of thickened and elongated toenails which or cough walking wearing shoes and requests toenail debridement. The last visit for this service was 10/18/2014  Review of Systems  Respiratory: Positive for shortness of breath and wheezing.   Skin: Positive for color change.       Objective:   Physical Exam  Physical Exam  Orientated 3 Nasal oxygen in use by patient today  Vascular: DP pulses 2/4 bilaterally PT pulses 2/4 bilaterally Capillary reflex immediate bilaterally  Neurological: Sensation to 10 g monofilament wire intact 5/5 right and 1/5 left Vibratory sensation equal and reactive bilaterally Ankle reflex equal and reactive bilaterally  Dermatological: Extremely dry plantar skin noted bilaterally The toenails are extremely elongated, hypertrophic, discolored, incurvated, and tender to palpation 6-10  Musculoskeletal: No deformities noted bilaterally There is no obvious restriction ankle, subtalar, midtarsal joints bilaterally      Assessment & Plan:  Assessment: Satisfactory vascular status Loss of protective sensation left Symptomatic onychomycoses 6-10 Xerosis  HIV positive Type II diabetic  Plan: Debridement toenails 10 without any bleeding Patient was advised to apply all-purpose skin lotion twice a day and at night also consider the use of Saran wrap with his sock to further reduce the excessive dryness in the skin Also, suggested the use a pumice stone or callus file after showering to reduce some of the scaling from dry skin on his plantar feet bilaterally  Reappoint 3 months

## 2016-01-23 ENCOUNTER — Telehealth: Payer: Self-pay

## 2016-01-23 NOTE — Telephone Encounter (Signed)
Chris [name],[facility] scheduler notified of the patient's need for cab transportation to his appointment tomorrow.

## 2016-01-23 NOTE — Telephone Encounter (Signed)
Transitional Care Clinic Post-discharge Follow-Up Phone Call:  Date of Discharge: 01/18/2016 Principal Discharge Diagnosis(es): COPD exacerbation - on home O2, DM, HIV, OSA Post-discharge Communication: Call placed to the patient Call Completed: Yes                 With Whom: Patient Interpreter Needed: No     Please check all that apply:  X  Patient is knowledgeable of his/her condition(s) and/or treatment. X -  Patient is caring for self at home. He stated that he is " doing fine" and has " help" if needed.  He did not specify who was able to help him, stating that he has help if needed ? Patient is receiving assist at home from family and/or caregiver. Family and/or caregiver is knowledgeable of patient's condition(s) and/or treatment. ? Patient is receiving home health services. If so, name of agency.     Medication Reconciliation:  ? Medication list reviewed with patient. X  Patient obtained all discharge medications. If not, why? He stated that he has all of his medications and has been taking them as ordered. He confirmed that he picked up the prednisone and zithromax at the pharmacy and has completed taking the 4 tabs of each as instructed. He said that he did not need to review his medication list with this CM and he would review it with the doctor tomorrow. He then noted that he was trying to eat a meal.   He stated that he did not have any questions about his medications.  He noted that he has a nebulizer but will soon need more nebulizer solution.   He reported that he has not been consistent with checking his blood sugars on a regular basis. He said that he didn't check his blood sugar this morning and he is " not doing a lot of that." ( referring to checking his blood sugars)  He said that yesterday his blood sugar was " high in the morning - 200 something" and then "went down to 160 something " later in the day. Instructed him about the importance of checking his blood sugars as  ordered and keeping a log of the results.      Activities of Daily Living:  X  Independent - on continuous O2 @ 2L/minute. He stated that no new DME was ordered.  ? Needs assist (describe; ? home DME used) ? [facility] (describe, ? home DME used)   Community resources in place for patient:  X  None  -He said that he does not want any home care.  ? Home Health/Home DME ? Assisted Living ? Support Group               Questions/Concerns discussed: confirmed his appointment for tomorrow, 01/24/16 @ 1045 with Dr Venetia NightAmao and instructed him to bring all of his medications and his blood sugar log with him to the appointment. He stated that he would and indicated that his " car is down" and he doesn't have transportation to the clinic.  [facility]. Charlett LangoJamilla Pinder, [facility] Practice Manager, notified of the patient's need for a cab.

## 2016-01-24 ENCOUNTER — Encounter: Payer: Self-pay | Admitting: Family Medicine

## 2016-01-24 ENCOUNTER — Ambulatory Visit: Payer: Medicare Other | Attending: Family Medicine | Admitting: Family Medicine

## 2016-01-24 VITALS — BP 114/74 | HR 102 | Temp 98.1°F | Resp 16 | Ht 69.0 in | Wt 276.4 lb

## 2016-01-24 DIAGNOSIS — J441 Chronic obstructive pulmonary disease with (acute) exacerbation: Secondary | ICD-10-CM | POA: Insufficient documentation

## 2016-01-24 DIAGNOSIS — Z794 Long term (current) use of insulin: Secondary | ICD-10-CM

## 2016-01-24 DIAGNOSIS — E1165 Type 2 diabetes mellitus with hyperglycemia: Secondary | ICD-10-CM | POA: Diagnosis not present

## 2016-01-24 DIAGNOSIS — B20 Human immunodeficiency virus [HIV] disease: Secondary | ICD-10-CM

## 2016-01-24 DIAGNOSIS — R0602 Shortness of breath: Secondary | ICD-10-CM | POA: Insufficient documentation

## 2016-01-24 DIAGNOSIS — E118 Type 2 diabetes mellitus with unspecified complications: Secondary | ICD-10-CM

## 2016-01-24 DIAGNOSIS — M1711 Unilateral primary osteoarthritis, right knee: Secondary | ICD-10-CM

## 2016-01-24 DIAGNOSIS — J9621 Acute and chronic respiratory failure with hypoxia: Secondary | ICD-10-CM

## 2016-01-24 DIAGNOSIS — I10 Essential (primary) hypertension: Secondary | ICD-10-CM

## 2016-01-24 DIAGNOSIS — J449 Chronic obstructive pulmonary disease, unspecified: Secondary | ICD-10-CM

## 2016-01-24 LAB — GLUCOSE, POCT (MANUAL RESULT ENTRY): POC GLUCOSE: 229 mg/dL — AB (ref 70–99)

## 2016-01-24 MED ORDER — METFORMIN HCL 500 MG PO TABS: 1000.0000 mg | ORAL_TABLET | Freq: Two times a day (BID) | ORAL | Status: DC

## 2016-01-24 MED ORDER — MOMETASONE FURO-FORMOTEROL FUM 200-5 MCG/ACT IN AERO: 2.0000 | INHALATION_SPRAY | Freq: Two times a day (BID) | RESPIRATORY_TRACT | Status: DC

## 2016-01-24 MED ORDER — IPRATROPIUM-ALBUTEROL 0.5-2.5 (3) MG/3ML IN SOLN: 3.0000 mL | Freq: Four times a day (QID) | RESPIRATORY_TRACT | Status: DC | PRN

## 2016-01-24 MED ORDER — ACETAMINOPHEN-CODEINE #3 300-30 MG PO TABS: 1.0000 | ORAL_TABLET | Freq: Three times a day (TID) | ORAL | Status: DC | PRN

## 2016-01-24 MED ORDER — "INSULIN SYRINGE-NEEDLE U-100 31G X 15/64"" 0.5 ML MISC": 1.0000 | Freq: Every day | Status: DC
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
## 2016-01-24 NOTE — Progress Notes (Signed)
Pt here for hospital F/U for COPD. Pt reports feeling very short of breath. PT reports lower back pain rated as a 7 out of 10. PT needs refill of insulin syringe needles. Pt reports he is almost out of pain medication. Pt has eaten today and did not take medications. Pt CBG is 229.

## 2016-01-28 ENCOUNTER — Ambulatory Visit (INDEPENDENT_AMBULATORY_CARE_PROVIDER_SITE_OTHER): Payer: Medicare Other | Admitting: Internal Medicine

## 2016-01-28 ENCOUNTER — Ambulatory Visit: Payer: Medicare Other | Admitting: Pulmonary Disease

## 2016-01-28 ENCOUNTER — Encounter: Payer: Self-pay | Admitting: Internal Medicine

## 2016-01-28 VITALS — BP 124/80 | HR 100 | Ht 69.0 in | Wt 272.0 lb

## 2016-01-28 DIAGNOSIS — Z720 Tobacco use: Secondary | ICD-10-CM

## 2016-01-28 DIAGNOSIS — J449 Chronic obstructive pulmonary disease, unspecified: Secondary | ICD-10-CM | POA: Diagnosis not present

## 2016-01-28 DIAGNOSIS — J9612 Chronic respiratory failure with hypercapnia: Secondary | ICD-10-CM

## 2016-01-28 DIAGNOSIS — J9611 Chronic respiratory failure with hypoxia: Secondary | ICD-10-CM | POA: Diagnosis not present

## 2016-01-28 MED ORDER — PREDNISONE 10 MG PO TABS: ORAL_TABLET | ORAL | Status: DC

## 2016-01-28 MED ORDER — TIOTROPIUM BROMIDE-OLODATEROL 2.5-2.5 MCG/ACT IN AERS: 2.0000 | INHALATION_SPRAY | Freq: Every day | RESPIRATORY_TRACT | Status: DC

## 2016-01-28 NOTE — Patient Instructions (Addendum)
Prednisone 10 mg take  4 each am x 2 days,   2 each am x 2 days,  1 each am x 2 days and stop   Stop all smoking permanently   Plan A = Automatic = Stiolto 2 puffs each am and stop spiriva                                      Continue qvar 80 Take 2 puffs first thing in am and then another 2 puffs about 12 hours later.    Work on inhaler technique:  relax and gently blow all the way out then take a nice smooth deep breath back in, triggering the inhaler at same time you start breathing in.  Hold for up to 5 seconds if you can. Blow out thru nose. Rinse and gargle with water when done      Plan B = Backup Only use your albuterol as a rescue medication to be used if you can't catch your breath by resting or doing a relaxed purse lip breathing pattern.  - The less you use it, the better it will work when you need it. - Ok to use the inhaler up to 2 puffs  every 4 hours if you must but call for appointment if use goes up over your usual need - Don't leave home without it !!  (think of it like the spare tire for your car)   Plan C = Crisis - only use your albuterol nebulizer if you first try Plan B and it fails to help > ok to use the nebulizer up to every 4 hours but if start needing it regularly call for immediate appointment   Please schedule a follow up office visit in 4 weeks, sooner if needed

## 2016-01-28 NOTE — Progress Notes (Signed)
Subjective:    Patient ID: Chris [name], male    DOB: 10/01/1965   MRN: [id number]    Brief patient profile:  48 yobm smoker with HIV  and doe x 2009 much worse since March 2014 so referred by Manson PasseyAlma Devine to pulmonary clinic 02/15/2013 with GOLD III copd by pfts 04/28/13.  HPI 02/15/2013 1st pulmonary eval  Cc indolent onset progressive doe x walking slow pace more than 100 ft,  not at rest., 02 dep, doe much worse x 3 months assoc with congested cough esp in am with sev tbsp thick white mucus takes about 30 min to clear.  No better on inhalers tried to date including advair and spiriva - albuterol works the best and using lots of saba and nebs rec Stop advair and spiriva Plan A = Automatic = Start symbicort 160 Take 2 puffs first thing in am and then another 2 puffs about 12 hours later.  Plan B = backup= Only use your albuterol (as a rescue medication to be used if you can't catch your breath by resting or doing a relaxed purse lip breathing pattern. The less you use it, the better it will work when you need it. Ok to use up to 2 puffs every 4 hours Plan C = nebulizer, use this only if plan B doesn't work ok to use up to 4 hours  03/15/2013 f/u ov/Chris [name] still smoking  Chief Complaint  Patient presents with  . 4 wk follow up    Breathing worse at times.  Wheezing, chest tightness at times, and cough with yellow to clear mucus at times also reported.  Fatigues easily.   has not followed the previous instructions and symptoms have not really changed  rec Plan A = Automatic = Start symbicort 160 Take 2 puffs first thing in am and then another 2 puffs about 12 hours later.  Plan B = backup= Only use your albuterol (proiare) as a rescue medication to be used if you can't catch your breath by resting or doing a relaxed purse lip breathing pattern. The less you use it, the better it will work when you need it. Ok to use up to 2 puffs every 4 hours Plan C = nebulizer, use this only if plan B doesn't work  after 15 min ok to use up to 4 hours  The key is to stop smoking completely before smoking completely stops you!    04/28/2013 f/u ov/Chris [name] still smoking  re:  GOLD III/ 02 dep @ 2lpm  Chief Complaint  Patient presents with  . Follow-up    Pt states that his breathing has improved, symbicort seems to be helping some. No new co's today. Using proair approx 4 times per wk.   still doe but has learned to pace himself and overall better but somewhat limited from desired activities even on his best days rec Plan A = Automatic =  symbicort 160 Take 2 puffs first thing in am and then another 2 puffs about 12 hours later.  Add spiriva one capsule daily each am Plan B = backup= Only use your albuterol (proiare) as a rescue medication to be used if you can't catch your breath by resting or doing a relaxed purse lip breathing pattern. The less you use it, the better it will work when you need it. Ok to use up to 2 puffs every 4 hours Plan C = nebulizer, use this only if plan B doesn't work after 15 min ok to use up  to 4 hours   03/14/2014 Follow up and Medication review  COPD GOLD III/ smoking only 2x daily / 02 2lpm 24/7/ symbicort  Patient returns for a two-week followup and medication review. We reviewed all his medications. Organized them into a medication calendar with patient education. Patient left a few of his medications at home.  Feels th at his breathing has improved since on Spiriva.  Patient has cut back on smoking. We discussed smoking cessation. Pt has had frequent COPD flare with steroids used x 2 over last 2 months. We discussed sided effects and risk/benefit of frequent steroid use especially along with his HIV meds and ICS inhalers.He verbalized understanding.  rec Use medical med calendar   05/16/2014 f/u ov/Chris [name] re: copd III/ still smoking/ no med calendar /02 dep but not using  Chief Complaint  Patient presents with  . Follow-up    COPD; fatigue; usually has O2 on 2L, did not  bring with him today; sometimes wears  2L at night   does not need saba often, sleeps ok despite taking second dose of symbicort 8 h p first  Also supposed to be on maint spiriva but not clear he's using it  rec The key is to stop smoking completely before smoking completely stops you!  See calendar for specific medication    01/05/2015 f/u ov/Chris [name] re: GOLD III copd on Qvar 80 2bid /spiriva  Chief Complaint  Patient presents with  . Follow-up    Pt states that his breathing is doing well overall. He is using albuterol inhaler 3 x per wk on average and uses neb usually once per day.   using proair one or twice and not nebulizer at all  rec 02 can just be used as needed The key is to stop smoking completely before smoking completely stops you!   Admit date: 01/17/2016 Discharge date: 01/18/2016  Time spent: > 35 minutes  Recommendations for Outpatient Follow-up:  1. Please decide whether or not patient will require a more prolonged steroid regimen   Discharge Diagnoses:  Active Problems:  Copd exacerbation   Discharge Condition: stable  Diet recommendation: carb modified diet  Filed Weights   01/18/16 0104  Weight: 122.471 kg (270 lb)    History of present illness:  50 year old male with history of DM, HIV, COPD on home oxygen, OSA, ED, obesity who came with cc of dyspnea that started 4 days ago and has been getting worse associated with cough productive of yellowish sputum but no chest pain. No fever or chills. No preceding upper respiratory symptoms. He has mild generalized abdominal pain but no nausea, vomiting, diarrhea or constipation. No other complaints.   Hospital Course:  COPD exacerbation - Improved with steroid and antibiotic administration - Patient much improved and requesting discharge. He has no new complaints. - Continue prednisone for 4 more days to complete a 5 day treatment course. - Should patient's condition worsen he is to go get a  reevaluation with his pcp, urgent care, or ED. Patient verbalizes understanding and agreement. - He assures me that he has oxygen at home.       01/28/2016 extended  f/u ov/re-establish care p Admit/Salathiel Ferrara re: copd G III/ qvar 80 2bid and spiriva and still smoking  Chief Complaint  Patient presents with  . Acute Visit    Pt c/o increased SOB for the past 2 months. He also has had increased cough with yellow sputum.  He is using albuterol inhaler 3 x daily on  average and Duoneb at least 2 x daily.    doe = MMRC3 = can't walk 100 yards even at a slow pace at a flat grade s stopping due to sob  Even on 02/ sleeps ok/ was placed on acei but denies taking it   Losing ground again since admit    No obvious day to day or daytime variabilty or assoc chronic cough or cp or chest tightness, subjective wheeze overt sinus or hb symptoms. No unusual exp hx or h/o childhood pna/ asthma or knowledge of premature birth.  Sleeping ok without nocturnal  or early am exacerbation  of respiratory  c/o's or need for noct saba. Also denies any obvious fluctuation of symptoms with weather or environmental changes or other aggravating or alleviating factors except as outlined above   Current Medications, Allergies, Complete Past Medical History, Past Surgical History, Family History, and Social History were reviewed in [facility] electronic medical record.  ROS  The following are not active complaints unless bolded sore throat, dysphagia, dental problems, itching, sneezing,  nasal congestion or excess/ purulent secretions, ear ache,   fever, chills, sweats, unintended wt loss, pleuritic or exertional cp, hemoptysis,  orthopnea pnd or leg swelling, presyncope, palpitations, heartburn, abdominal pain, anorexia, nausea, vomiting, diarrhea  or change in bowel or urinary habits, change in stools or urine, dysuria,hematuria,  rash, arthralgias, visual complaints, headache, numbness weakness or ataxia or problems with  walking or coordination,  change in mood/affect or memory.                     Objective:   Physical Exam   amb bm nad   01/05/2015       278 01/28/2016        272   05/16/14 285 lb 6.4 oz (129.457 kg)  04/20/14 282 lb 12.8 oz (128.277 kg)  03/14/14 274 lb 3.2 oz (124.376 kg)     HEENT mild turbinate edema.  Oropharynx no thrush or excess pnd or cobblestoning.  No JVD or cervical adenopathy. Mild accessory muscle hypertrophy. Trachea midline, nl thryroid. Chest was hyperinflated by percussion with diminished breath sounds and moderate increased exp time with min end exp bilateral  wheeze. Hoover sign positive at mid inspiration. Regular rate and rhythm without murmur gallop or rub or increase P2 or edema.  Abd: no hsm, nl excursion. Ext warm without cyanosis or clubbing.         I personally reviewed images and agree with radiology impression as follows:  CXR: 01/17/16 Lungs at least mildly hyperexpanded, compatible with given history of COPD/emphysema. Suspect associated chronic bronchitic changes centrally and chronic interstitial lung disease. 2. No acute findings. No evidence of pneumonia. 3. Stable mild cardiomegaly. No evidence of active CHF.      Assessment & Plan:

## 2016-01-29 ENCOUNTER — Telehealth: Payer: Self-pay

## 2016-01-29 NOTE — Assessment & Plan Note (Signed)
-   04/28/2013 PFT's FEV1 1.21 (39%) ratio 59 and 13% better p B2 and dlco 72 corrects to 102  - started spiriva 04/30/2013 > changed to respimat 02/28/2014 -02/28/2014 p extensive coaching HFA effectiveness =    90%  -Med calendar 03/14/2014 > not using as of 05/16/14   DDX of  difficult airways management almost all start with A and  include Adherence, Ace Inhibitors, Acid Reflux, Active Sinus Disease, Alpha 1 Antitripsin deficiency, Anxiety masquerading as Airways dz,  ABPA,  Allergy(esp in young), Aspiration (esp in elderly), Adverse effects of meds,  Active smokers, A bunch of PE's (a small clot burden can't cause this syndrome unless there is already severe underlying pulm or vascular dz with poor reserve) plus two Bs  = Bronchiectasis and Beta blocker use..and one C= CHF  Adherence is always the initial "prime suspect" and is a multilayered concern that requires a "trust but verify" approach in every patient - starting with knowing how to use medications, especially inhalers, correctly, keeping up with refills and understanding the fundamental difference between maintenance and prns vs those medications only taken for a very short course and then stopped and not refilled.  - 01/28/2016  After extensive coaching HFA effectiveness =    90% with respimat > try stiolto x 4 week samples  Active smoking (see separate a/p)   ACEi > avoid permanently, arb ok  ? Allergy component > doubt > rec Prednisone 10 mg take  4 each am x 2 days,   2 each am x 2 days,  1 each am x 2 days and stop only / continue qvar giving him a LAMA/LABA/ICS combo per guidelines for refractory symptoms with flare resulting in admit.   I had an extended discussion with the patient reviewing all relevant studies completed to date and  lasting 25 minutes of a 40  minute extended post hosp f/u / transition of care/ re-establish f/u ov     Each maintenance medication was reviewed in detail including most importantly the difference between  maintenance and prns and under what circumstances the prns are to be triggered using an action plan format that is not reflected in the computer generated alphabetically organized AVS.    Please see instructions for details which were reviewed in writing and the patient given a copy highlighting the part that I personally wrote and discussed at today's ov.

## 2016-01-29 NOTE — Assessment & Plan Note (Signed)
02 2lpm 24/7   -  HCO3 31 01/17/16 - 01/05/2015  Walked RA x 3 laps @ 185 ft each stopped due to  Leg pain and fatibue, no sob , nl pace, no desat   rec 2lpm 24/7

## 2016-01-29 NOTE — Telephone Encounter (Signed)
Call placed to the patient to check on his status.  He said that today he is " feeling decent" noting that he is " coming along."  He explained that he saw the pulmonologist yesterday.  He explained his orders for his inhalers - Qvar and stiolto as well as the orders for the prednisone and Plan A/B/C for management of his respiratory symptoms. He stated that he has not used the albuterol inhaler today but he used the albuterol nebulizer. He noted that he understands that he is supposed to use the albuterol inhaler if needed before using the nebulizer; but he decided to use the nebulizer instead. He said that he only has enough stiolto for 4 weeks and will then need to follow up with pulmonary.   He said that his blood sugar was 210 this morning but he doesn't check his blood sugar very often, stating that he " forgets a lot" but knows that he should be checking it more often.   He also noted that he is trying to monitor what he eats focusing on controlling the amount of food that he has been eating - better portion control at this time.  No other questions/ concerns reported at this time.  [facility] @ 0930.

## 2016-01-29 NOTE — Assessment & Plan Note (Signed)
Body mass index is 40.15 kg/(m^2).  Lab Results  Component Value Date   TSH 1.507 08/21/2015     Contributing to gerd tendency/ doe/reviewed the need and the process to achieve and maintain neg calorie balance > defer f/u primary care including intermittently monitoring thyroid status

## 2016-02-04 ENCOUNTER — Ambulatory Visit: Payer: Medicare Other | Admitting: Pulmonary Disease

## 2016-02-04 ENCOUNTER — Telehealth: Payer: Self-pay

## 2016-02-04 NOTE — Telephone Encounter (Signed)
Attempted to contact the patient to check on his status and to remind him of [facility],02/05/16 @ 0930 and to inquire if he needs transportation to the clinic. Call placed to #[phone] (H) and a HIPAA compliant voicemail message was left requesting a call back to # [phone] or [phone]

## 2016-02-05 ENCOUNTER — Telehealth: Payer: Self-pay

## 2016-02-05 ENCOUNTER — Ambulatory Visit: Payer: Medicare Other | Admitting: Family Medicine

## 2016-02-05 NOTE — Telephone Encounter (Signed)
Call received from the patient inquiring if he can have transportation to his appointment at [facility] next week. He stated that he was told yesterday that transportation was not available and he re-scheduled his appointment to next week - 02/14/16. This CM explained to him that transportation would be available for him.  He said that he is working on getting transportation but is appreciative of the assistance.  He stated that he feels like he is " coming along" and is " way better than before the hospital."  He noted that he is following is plan established by his pulmonologist.  No problems/concerns reported at this time.

## 2016-02-13 ENCOUNTER — Telehealth: Payer: Self-pay

## 2016-02-13 NOTE — Telephone Encounter (Signed)
This Case Manager placed call to patient to remind him of upcoming Transitional Care follow-up appointment on 02/14/16 at 0930 with Dr. Venetia NightAmao. Patient aware of his appointment and indicated he would need transportation to his appointment. Clinical Scheduler updated so transportation could be arranged. Patient indicated he was being compliant with his medications and indicated he was also now keeping a log of his blood glucose readings. Instructed patient to bring his medications as well as blood glucose log to upcoming appointment. Patient verbalized understanding. No additional needs/concerns identified.

## 2016-02-13 NOTE — Telephone Encounter (Signed)
This Case Manager placed call to patient to check status, to remind him of Transitional Care follow-up appointment on 02/14/16 at 0930, and to determine if transportation needed to upcoming appointment. Call placed to #[phone]; unable to reach patient. HIPPA compliant voicemail left requesting return call.

## 2016-02-14 ENCOUNTER — Encounter: Payer: Self-pay | Admitting: Family Medicine

## 2016-02-14 ENCOUNTER — Telehealth: Payer: Self-pay

## 2016-02-14 ENCOUNTER — Ambulatory Visit: Payer: Medicare Other | Attending: Family Medicine | Admitting: Family Medicine

## 2016-02-14 VITALS — BP 126/83 | HR 100 | Temp 98.1°F | Ht 69.0 in | Wt 271.6 lb

## 2016-02-14 DIAGNOSIS — E669 Obesity, unspecified: Secondary | ICD-10-CM | POA: Diagnosis not present

## 2016-02-14 DIAGNOSIS — E118 Type 2 diabetes mellitus with unspecified complications: Secondary | ICD-10-CM

## 2016-02-14 DIAGNOSIS — R52 Pain, unspecified: Secondary | ICD-10-CM | POA: Diagnosis not present

## 2016-02-14 DIAGNOSIS — Z9889 Other specified postprocedural states: Secondary | ICD-10-CM | POA: Diagnosis not present

## 2016-02-14 DIAGNOSIS — I10 Essential (primary) hypertension: Secondary | ICD-10-CM | POA: Insufficient documentation

## 2016-02-14 DIAGNOSIS — M549 Dorsalgia, unspecified: Secondary | ICD-10-CM | POA: Diagnosis not present

## 2016-02-14 DIAGNOSIS — J9612 Chronic respiratory failure with hypercapnia: Secondary | ICD-10-CM

## 2016-02-14 DIAGNOSIS — B20 Human immunodeficiency virus [HIV] disease: Secondary | ICD-10-CM | POA: Diagnosis not present

## 2016-02-14 DIAGNOSIS — M1711 Unilateral primary osteoarthritis, right knee: Secondary | ICD-10-CM

## 2016-02-14 DIAGNOSIS — E119 Type 2 diabetes mellitus without complications: Secondary | ICD-10-CM | POA: Insufficient documentation

## 2016-02-14 DIAGNOSIS — F17210 Nicotine dependence, cigarettes, uncomplicated: Secondary | ICD-10-CM | POA: Diagnosis not present

## 2016-02-14 DIAGNOSIS — R490 Dysphonia: Secondary | ICD-10-CM

## 2016-02-14 DIAGNOSIS — Z79899 Other long term (current) drug therapy: Secondary | ICD-10-CM | POA: Diagnosis not present

## 2016-02-14 DIAGNOSIS — Z794 Long term (current) use of insulin: Secondary | ICD-10-CM | POA: Diagnosis not present

## 2016-02-14 DIAGNOSIS — B379 Candidiasis, unspecified: Secondary | ICD-10-CM | POA: Diagnosis not present

## 2016-02-14 DIAGNOSIS — J449 Chronic obstructive pulmonary disease, unspecified: Secondary | ICD-10-CM | POA: Diagnosis present

## 2016-02-14 DIAGNOSIS — Z888 Allergy status to other drugs, medicaments and biological substances status: Secondary | ICD-10-CM | POA: Insufficient documentation

## 2016-02-14 DIAGNOSIS — B370 Candidal stomatitis: Secondary | ICD-10-CM

## 2016-02-14 DIAGNOSIS — J9611 Chronic respiratory failure with hypoxia: Secondary | ICD-10-CM

## 2016-02-14 LAB — GLUCOSE, POCT (MANUAL RESULT ENTRY): POC GLUCOSE: 294 mg/dL — AB (ref 70–99)

## 2016-02-14 MED ORDER — FLUCONAZOLE 150 MG PO TABS: 150.0000 mg | ORAL_TABLET | Freq: Once | ORAL | Status: DC

## 2016-02-14 MED ORDER — ACETAMINOPHEN-CODEINE #3 300-30 MG PO TABS: 1.0000 | ORAL_TABLET | Freq: Three times a day (TID) | ORAL | Status: DC | PRN

## 2016-02-14 MED ORDER — LOSARTAN POTASSIUM 25 MG PO TABS
25.0000 mg | ORAL_TABLET | Freq: Every day | ORAL | Status: DC
Start: 2016-02-14 — End: 2016-06-06

## 2016-02-14 MED ORDER — CETIRIZINE HCL 10 MG PO TABS: 10.0000 mg | ORAL_TABLET | Freq: Every day | ORAL | Status: DC

## 2016-02-14 NOTE — Progress Notes (Signed)
Patient's forms faxed to [facility] for oxygen verification.
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
## 2016-02-14 NOTE — Progress Notes (Signed)
SOB- O2 wasn't working Cough- 3-4 days productive, clear No food today-drank "cherry soda" Medication refills

## 2016-02-14 NOTE — Telephone Encounter (Signed)
This Case Manager received return call from patient. Informed patient that since lisinopril/HCTZ was discontinued by Dr. Sherene SiresWert, Dr. Venetia NightAmao prescribed Losartan 25 mg daily for renal protection. Patient informed that script for medication was sent to [facility] at [address]. Patient verbalized understanding.  Patient also indicated he is supposed to follow-up with Dr. Venetia NightAmao in one month. He asked this Case Manager if he could get transportation to his appointment. Informed patient that he would benefit from calling [facility] [phone]) to determine if eligible for Medicaid transportation services. Informed him he will need to do a transportation assessment over the phone and if approved for transportation services will be able to receive free transportation to medical appointments. Patient verbalized understanding and indicated he would [facility]. No additional needs identified.

## 2016-02-14 NOTE — Progress Notes (Signed)
This Case Manager spoke with Quentin AngstKay Lieb, RN who indicated patient's portable oxygen not working correctly. Oxygen pouring from top of portable oxygen, and patient not getting correct amount of oxygen. Supplier: [facility]. Portable oxygen fixed at [facility] retail store.  Repaired portable oxygen tank given to patient who was appreciative. No additional needs identified.

## 2016-02-14 NOTE — Telephone Encounter (Signed)
Dr. Venetia NightAmao indicated she ordered Losartan for patient and sent script to pharmacy on file. She indicated change was made after patient given AVS. This Case Manager placed call to #[phone] to inform patient; unable to reach patient. HIPPA compliant voicemail left requesting return call.

## 2016-02-20 ENCOUNTER — Telehealth: Payer: Self-pay

## 2016-02-20 NOTE — Telephone Encounter (Signed)
Call placed to the patient to check on his status. He stated that he is feeling " all right."  He noted that he is still short of breath at times but overall the breathing is " a little better."  He said that he has all of his medications, including inhalers and is taking them as ordered and is following the plan established by Dr Sherene SiresWert.  He noted that his cough is " slowly going away."  Reminded him of the importance of keeping his appointment with pulmonary on 02/29/16.  He said that he is not able to drive and is not sure he will have transportation. Encouraged him to call Medicaid transportation for an assessment. He said that he has the phone # tried to call a couple of times but has been on hold 15-30 minutes. Again encouraged him to keep trying so that transportation can be scheduled for his appointment on 02/29/16 and he stated that he would

## 2016-02-29 ENCOUNTER — Encounter: Payer: Self-pay | Admitting: Internal Medicine

## 2016-02-29 ENCOUNTER — Ambulatory Visit (INDEPENDENT_AMBULATORY_CARE_PROVIDER_SITE_OTHER): Payer: Medicare Other | Admitting: Internal Medicine

## 2016-02-29 VITALS — BP 136/84 | HR 78 | Ht 69.0 in | Wt 274.0 lb

## 2016-02-29 DIAGNOSIS — J449 Chronic obstructive pulmonary disease, unspecified: Secondary | ICD-10-CM | POA: Diagnosis not present

## 2016-02-29 DIAGNOSIS — J9612 Chronic respiratory failure with hypercapnia: Secondary | ICD-10-CM

## 2016-02-29 DIAGNOSIS — Z720 Tobacco use: Secondary | ICD-10-CM

## 2016-02-29 DIAGNOSIS — J9611 Chronic respiratory failure with hypoxia: Secondary | ICD-10-CM

## 2016-02-29 DIAGNOSIS — F17210 Nicotine dependence, cigarettes, uncomplicated: Secondary | ICD-10-CM

## 2016-02-29 MED ORDER — BUDESONIDE-FORMOTEROL FUMARATE 80-4.5 MCG/ACT IN AERO: INHALATION_SPRAY | RESPIRATORY_TRACT | Status: DC

## 2016-02-29 MED ORDER — TIOTROPIUM BROMIDE MONOHYDRATE 1.25 MCG/ACT IN AERS: 2.0000 | INHALATION_SPRAY | Freq: Every day | RESPIRATORY_TRACT | Status: DC

## 2016-02-29 MED ORDER — BUDESONIDE-FORMOTEROL FUMARATE 160-4.5 MCG/ACT IN AERO: INHALATION_SPRAY | RESPIRATORY_TRACT | Status: DC

## 2016-02-29 MED ORDER — TIOTROPIUM BROMIDE MONOHYDRATE 2.5 MCG/ACT IN AERS: INHALATION_SPRAY | RESPIRATORY_TRACT | Status: DC

## 2016-02-29 NOTE — Patient Instructions (Addendum)
Plan A = Automatic =  Symbicort 80 Take 2 puffs first thing in am and then another 2 puffs about 12 hours later   And spiriva 2 puffs each am (sample = 4 pffs)   Plan B = Backup Only use your albuterol (ventolin)  as a rescue medication to be used if you can't catch your breath by resting or doing a relaxed purse lip breathing pattern.  - The less you use it, the better it will work when you need it. - Ok to use the inhaler up to 2 puffs  every 4 hours if you must but call for appointment if use goes up over your usual need - Don't leave home without it !!  (think of it like the spare tire for your car)   Plan C = Crisis - only use your albuterol nebulizer if you first try Plan B and it fails to help > ok to use the nebulizer up to every 4 hours but if start needing it regularly call for immediate appointment   Plan D = Doctor - call me if B and C not adequate  Plan E = ER - go to ER or call 911 if all else fails    Ok to leave off 02 at rest but wear it at bedtime and as needed daytime  Please schedule a follow up office visit in 6 weeks, call sooner if needed with pfts

## 2016-02-29 NOTE — Progress Notes (Signed)
Subjective:    Patient ID: Chris [name], male    DOB: 10/01/1965   MRN: [id number]    Brief patient profile:  48 yobm smoker with HIV  and doe x 2009 much worse since March 2014 so referred by Manson PasseyAlma Devine to pulmonary clinic 02/15/2013 with GOLD III copd by pfts 04/28/13.  HPI 02/15/2013 1st pulmonary eval  Cc indolent onset progressive doe x walking slow pace more than 100 ft,  not at rest., 02 dep, doe much worse x 3 months assoc with congested cough esp in am with sev tbsp thick white mucus takes about 30 min to clear.  No better on inhalers tried to date including advair and spiriva - albuterol works the best and using lots of saba and nebs rec Stop advair and spiriva Plan A = Automatic = Start symbicort 160 Take 2 puffs first thing in am and then another 2 puffs about 12 hours later.  Plan B = backup= Only use your albuterol (as a rescue medication to be used if you can't catch your breath by resting or doing a relaxed purse lip breathing pattern. The less you use it, the better it will work when you need it. Ok to use up to 2 puffs every 4 hours Plan C = nebulizer, use this only if plan B doesn't work ok to use up to 4 hours  03/15/2013 f/u ov/Chris [name] still smoking  Chief Complaint  Patient presents with  . 4 wk follow up    Breathing worse at times.  Wheezing, chest tightness at times, and cough with yellow to clear mucus at times also reported.  Fatigues easily.   has not followed the previous instructions and symptoms have not really changed  rec Plan A = Automatic = Start symbicort 160 Take 2 puffs first thing in am and then another 2 puffs about 12 hours later.  Plan B = backup= Only use your albuterol (proiare) as a rescue medication to be used if you can't catch your breath by resting or doing a relaxed purse lip breathing pattern. The less you use it, the better it will work when you need it. Ok to use up to 2 puffs every 4 hours Plan C = nebulizer, use this only if plan B doesn't work  after 15 min ok to use up to 4 hours  The key is to stop smoking completely before smoking completely stops you!    04/28/2013 f/u ov/Chris [name] still smoking  re:  GOLD III/ 02 dep @ 2lpm  Chief Complaint  Patient presents with  . Follow-up    Pt states that his breathing has improved, symbicort seems to be helping some. No new co's today. Using proair approx 4 times per wk.   still doe but has learned to pace himself and overall better but somewhat limited from desired activities even on his best days rec Plan A = Automatic =  symbicort 160 Take 2 puffs first thing in am and then another 2 puffs about 12 hours later.  Add spiriva one capsule daily each am Plan B = backup= Only use your albuterol (proiare) as a rescue medication to be used if you can't catch your breath by resting or doing a relaxed purse lip breathing pattern. The less you use it, the better it will work when you need it. Ok to use up to 2 puffs every 4 hours Plan C = nebulizer, use this only if plan B doesn't work after 15 min ok to use up  to 4 hours   03/14/2014 Follow up and Medication review  COPD GOLD III/ smoking only 2x daily / 02 2lpm 24/7/ symbicort  Patient returns for a two-week followup and medication review. We reviewed all his medications. Organized them into a medication calendar with patient education. Patient left a few of his medications at home.  Feels th at his breathing has improved since on Spiriva.  Patient has cut back on smoking. We discussed smoking cessation. Pt has had frequent COPD flare with steroids used x 2 over last 2 months. We discussed sided effects and risk/benefit of frequent steroid use especially along with his HIV meds and ICS inhalers.He verbalized understanding.  rec Use medical med calendar   05/16/2014 f/u ov/Chris [name] re: copd III/ still smoking/ no med calendar /02 dep but not using  Chief Complaint  Patient presents with  . Follow-up    COPD; fatigue; usually has O2 on 2L, did not  bring with him today; sometimes wears  2L at night   does not need saba often, sleeps ok despite taking second dose of symbicort 8 h p first  Also supposed to be on maint spiriva but not clear he's using it  rec The key is to stop smoking completely before smoking completely stops you!  See calendar for specific medication    01/05/2015 f/u ov/Chris [name] re: GOLD III copd on Qvar 80 2bid /spiriva  Chief Complaint  Patient presents with  . Follow-up    Pt states that his breathing is doing well overall. He is using albuterol inhaler 3 x per wk on average and uses neb usually once per day.   using proair one or twice and not nebulizer at all  rec 02 can just be used as needed The key is to stop smoking completely before smoking completely stops you!   Admit date: 01/17/2016 Discharge date: 01/18/2016  Time spent: > 35 minutes  Recommendations for Outpatient Follow-up:  1. Please decide whether or not patient will require a more prolonged steroid regimen   Discharge Diagnoses:  Active Problems:  Copd exacerbation   Discharge Condition: stable  Diet recommendation: carb modified diet  Filed Weights   01/18/16 0104  Weight: 122.471 kg (270 lb)    History of present illness:  50 year old male with history of DM, HIV, COPD on home oxygen, OSA, ED, obesity who came with cc of dyspnea that started 4 days ago and has been getting worse associated with cough productive of yellowish sputum but no chest pain. No fever or chills. No preceding upper respiratory symptoms. He has mild generalized abdominal pain but no nausea, vomiting, diarrhea or constipation. No other complaints.   Hospital Course:  COPD exacerbation - Improved with steroid and antibiotic administration - Patient much improved and requesting discharge. He has no new complaints. - Continue prednisone for 4 more days to complete a 5 day treatment course. - Should patient's condition worsen he is to go get a  reevaluation with his pcp, urgent care, or ED. Patient verbalizes understanding and agreement. - He assures me that he has oxygen at home.       01/28/2016 extended  f/u ov/re-establish care p Admit/Chris [name] re: copd G III/ qvar 80 2bid and spiriva and still smoking  Chief Complaint  Patient presents with  . Acute Visit    Pt c/o increased SOB for the past 2 months. He also has had increased cough with yellow sputum.  He is using albuterol inhaler 3 x daily on  average and Duoneb at least 2 x daily.    doe = MMRC3 = can't walk 100 yards even at a slow pace at a flat grade s stopping due to sob  Even on 02/ sleeps ok/ was placed on acei but denies taking it   Losing ground again since admit  rec Prednisone 10 mg take  4 each am x 2 days,   2 each am x 2 days,  1 each am x 2 days and stop  Stop all smoking permanently  Plan A = Automatic = Stiolto 2 puffs each am and stop spiriva                                      Continue qvar 80 Take 2 puffs first thing in am and then another 2 puffs about 12 hours later.  Work on inhaler technique:   Plan B = Backup Only use your albuterol as a rescue medication Plan C = Crisis - only use your albuterol nebulizer if you first try Plan B and it fails to help > ok to use the nebulizer up to every 4 hours but if start needing it regularly call for immediate appointment    02/29/2016  f/u ov/Chris [name] re:  Copd GOLD III/ symbicort and stiolto / 02 prn / still smoking Chief Complaint  Patient presents with  . Follow-up    Pt states stopped qvar and started back on symbicort and his breathing has improved. He has not had to use albuterol inhaler but uses duoneb once per day. Still smoking 2 cigs per day.   doe = MMRC3 = can't walk 100 yards even at a slow pace at a flat grade s stopping due to sob even on  02  Confused with meds/ instructions : taking two labas now    No obvious day to day or daytime variabilty or assoc excess/ purulent sputum or mucus plugs   or  cp or chest tightness, subjective wheeze overt sinus or hb symptoms. No unusual exp hx or h/o childhood pna/ asthma or knowledge of premature birth.  Sleeping ok without nocturnal  or early am exacerbation  of respiratory  c/o's or need for noct saba. Also denies any obvious fluctuation of symptoms with weather or environmental changes or other aggravating or alleviating factors except as outlined above   Current Medications, Allergies, Complete Past Medical History, Past Surgical History, Family History, and Social History were reviewed in [employer] record.  ROS  The following are not active complaints unless bolded sore throat, dysphagia, dental problems, itching, sneezing,  nasal congestion or excess/ purulent secretions, ear ache,   fever, chills, sweats, unintended wt loss, pleuritic or exertional cp, hemoptysis,  orthopnea pnd or leg swelling, presyncope, palpitations, heartburn, abdominal pain, anorexia, nausea, vomiting, diarrhea  or change in bowel or urinary habits, change in stools or urine, dysuria,hematuria,  rash, arthralgias, visual complaints, headache, numbness weakness or ataxia or problems with walking or coordination,  change in mood/affect or memory.                     Objective:   Physical Exam   amb bm nad / vital signs reviewed and sats 94% on 2lpm and on RA sats 89% at rest  01/05/2015       278 >  02/29/2016  274  01/28/2016  272   05/16/14 285 lb 6.4 oz (129.457 kg)  04/20/14 282 lb 12.8 oz (128.277 kg)  03/14/14 274 lb 3.2 oz (124.376 kg)     HEENT mild turbinate edema.  Oropharynx no thrush or excess pnd or cobblestoning.  No JVD or cervical adenopathy. Mild accessory muscle hypertrophy. Trachea midline, nl thryroid. Chest was hyperinflated by percussion with diminished breath sounds and moderate increased exp time with min end exp bilateral  wheeze. Hoover sign positive at mid inspiration. Regular rate and rhythm without murmur  gallop or rub or increase P2 or edema.  Abd: no hsm, nl excursion. Ext warm without cyanosis or clubbing.         I personally reviewed images and agree with radiology impression as follows:  CXR: 01/17/16 Lungs at least mildly hyperexpanded, compatible with given history of COPD/emphysema. Suspect associated chronic bronchitic changes centrally and chronic interstitial lung disease. 2. No acute findings. No evidence of pneumonia. 3. Stable mild cardiomegaly. No evidence of active CHF.      Assessment & Plan:

## 2016-03-02 NOTE — Assessment & Plan Note (Signed)
-   01/05/2015  Walked RA x 3 laps @ 185 ft each stopped due to  Leg pain and fatibue, no sob , nl pace, no desat  -  HCO3 31 01/17/16  As of 02/29/2016 rec 2lpm hs and prn daytime

## 2016-03-02 NOTE — Assessment & Plan Note (Signed)
-   04/28/2013 PFT's FEV1 1.21 (39%) ratio 59 and 13% better p B2 and dlco 72 corrects to 102  - started spiriva 04/30/2013 > changed to respimat 02/28/2014 -Med calendar 03/14/2014 > not using as of 05/16/14  - arrived on stiolto / symbicort 02/29/2016 > changed to symbicort 80/spiriva (lower dose ICS due to interaction with aids meds     - The proper method of use, as well as anticipated side effects, of a metered-dose inhaler are discussed and demonstrated to the patient. Improved effectiveness after extensive coaching during this visit to a level of approximately 90 % from a baseline of 75 %   I had an extended discussion with the patient reviewing all relevant studies completed to date and  lasting 15 to 20 minutes of a 25 minute visit    Each maintenance medication was reviewed in detail including most importantly the difference between maintenance and prns and under what circumstances the prns are to be triggered using an action plan format that is not reflected in the computer generated alphabetically organized AVS.    Please see instructions for details which were reviewed in writing and the patient given a copy highlighting the part that I personally wrote and discussed at today's ov.

## 2016-03-02 NOTE — Assessment & Plan Note (Signed)
>   3 min Discussed the risks and costs (both direct and indirect)  of smoking relative to the benefits of quitting but patient unwilling to commit at this point to a specific quit date.    Although I don't endorse regular use of e cigs/ many pts find them helpful; however, I emphasized they should be considered a "one-way bridge" off all tobacco products.  

## 2016-03-05 ENCOUNTER — Telehealth: Payer: Self-pay | Admitting: Internal Medicine

## 2016-03-05 NOTE — Telephone Encounter (Signed)
Prior Authorization initiated for Symbicort.   Spoke with Rayna from [place] [id number] Approved through 08/10/2016  [facility] and advised them that medication has been approved, they ran the medication through and it was no charge.  Called and left detailed message on patient's voicemail advising him that medication was approved and ready to pick up at pharmacy.    Nothing further needed.

## 2016-03-11 ENCOUNTER — Other Ambulatory Visit: Payer: Self-pay | Admitting: Internal Medicine

## 2016-03-11 DIAGNOSIS — J449 Chronic obstructive pulmonary disease, unspecified: Secondary | ICD-10-CM

## 2016-03-11 DIAGNOSIS — J441 Chronic obstructive pulmonary disease with (acute) exacerbation: Secondary | ICD-10-CM

## 2016-03-14 ENCOUNTER — Other Ambulatory Visit: Payer: Self-pay | Admitting: Internal Medicine

## 2016-03-14 DIAGNOSIS — B20 Human immunodeficiency virus [HIV] disease: Secondary | ICD-10-CM

## 2016-03-27 ENCOUNTER — Ambulatory Visit: Payer: Medicare Other | Attending: Internal Medicine | Admitting: Internal Medicine

## 2016-03-27 ENCOUNTER — Encounter: Payer: Self-pay | Admitting: Internal Medicine

## 2016-03-27 VITALS — BP 113/67 | HR 85 | Temp 98.5°F | Resp 18 | Ht 69.0 in | Wt 274.8 lb

## 2016-03-27 DIAGNOSIS — B20 Human immunodeficiency virus [HIV] disease: Secondary | ICD-10-CM | POA: Diagnosis not present

## 2016-03-27 DIAGNOSIS — B370 Candidal stomatitis: Secondary | ICD-10-CM | POA: Insufficient documentation

## 2016-03-27 DIAGNOSIS — E118 Type 2 diabetes mellitus with unspecified complications: Secondary | ICD-10-CM

## 2016-03-27 DIAGNOSIS — I10 Essential (primary) hypertension: Secondary | ICD-10-CM

## 2016-03-27 DIAGNOSIS — M1711 Unilateral primary osteoarthritis, right knee: Secondary | ICD-10-CM | POA: Diagnosis not present

## 2016-03-27 DIAGNOSIS — Z794 Long term (current) use of insulin: Secondary | ICD-10-CM | POA: Diagnosis not present

## 2016-03-27 LAB — GLUCOSE, POCT (MANUAL RESULT ENTRY): POC Glucose: 154 mg/dl — AB (ref 70–99)

## 2016-03-27 LAB — POCT GLYCOSYLATED HEMOGLOBIN (HGB A1C): Hemoglobin A1C: 7.8

## 2016-03-27 MED ORDER — FLUCONAZOLE 150 MG PO TABS: 150.0000 mg | ORAL_TABLET | Freq: Every day | ORAL | 0 refills | Status: AC

## 2016-03-27 MED ORDER — ACETAMINOPHEN-CODEINE #3 300-30 MG PO TABS: 1.0000 | ORAL_TABLET | Freq: Three times a day (TID) | ORAL | 0 refills | Status: DC | PRN

## 2016-03-27 NOTE — Patient Instructions (Signed)
Diabetes and Exercise Exercising regularly is important. It is not just about losing weight. It has many health benefits, such as:  Improving your overall fitness, flexibility, and endurance.  Increasing your bone density.  Helping with weight control.  Decreasing your body fat.  Increasing your muscle strength.  Reducing stress and tension.  Improving your overall health. People with diabetes who exercise gain additional benefits because exercise:  Reduces appetite.  Improves the body's use of blood sugar (glucose).  Helps lower or control blood glucose.  Decreases blood pressure.  Helps control blood lipids (such as cholesterol and triglycerides).  Improves the body's use of the hormone insulin by:  Increasing the body's insulin sensitivity.  Reducing the body's insulin needs.  Decreases the risk for heart disease because exercising:  Lowers cholesterol and triglycerides levels.  Increases the levels of good cholesterol (such as high-density lipoproteins [HDL]) in the body.  Lowers blood glucose levels. YOUR ACTIVITY PLAN  Choose an activity that you enjoy, and set realistic goals. To exercise safely, you should begin practicing any new physical activity slowly, and gradually increase the intensity of the exercise over time. Your health care provider or diabetes educator can help create an activity plan that works for you. General recommendations include:  Encouraging children to engage in at least 60 minutes of physical activity each day.  Stretching and performing strength training exercises, such as yoga or weight lifting, at least 2 times per week.  Performing a total of at least 150 minutes of moderate-intensity exercise each week, such as brisk walking or water aerobics.  Exercising at least 3 days per week, making sure you allow no more than 2 consecutive days to pass without exercising.  Avoiding long periods of inactivity (90 minutes or more). When you  have to spend an extended period of time sitting down, take frequent breaks to walk or stretch. RECOMMENDATIONS FOR EXERCISING WITH TYPE 1 OR TYPE 2 DIABETES   Check your blood glucose before exercising. If blood glucose levels are greater than 240 mg/dL, check for urine ketones. Do not exercise if ketones are present.  Avoid injecting insulin into areas of the body that are going to be exercised. For example, avoid injecting insulin into:  The arms when playing tennis.  The legs when jogging.  Keep a record of:  Food intake before and after you exercise.  Expected peak times of insulin action.  Blood glucose levels before and after you exercise.  The type and amount of exercise you have done.  Review your records with your health care provider. Your health care provider will help you to develop guidelines for adjusting food intake and insulin amounts before and after exercising.  If you take insulin or oral hypoglycemic agents, watch for signs and symptoms of hypoglycemia. They include:  Dizziness.  Shaking.  Sweating.  Chills.  Confusion.  Drink plenty of water while you exercise to prevent dehydration or heat stroke. Body water is lost during exercise and must be replaced.  Talk to your health care provider before starting an exercise program to make sure it is safe for you. Remember, almost any type of activity is better than none.   This information is not intended to replace advice given to you by your health care provider. Make sure you discuss any questions you have with your health care provider.   Document Released: 10/18/2003 Document Revised: 12/12/2014 Document Reviewed: 01/04/2013 Elsevier Interactive Patient Education 2016 Elsevier Inc. Basic Carbohydrate Counting for Diabetes Mellitus Carbohydrate counting   is a method for keeping track of the amount of carbohydrates you eat. Eating carbohydrates naturally increases the level of sugar (glucose) in your  blood, so it is important for you to know the amount that is okay for you to have in every meal. Carbohydrate counting helps keep the level of glucose in your blood within normal limits. The amount of carbohydrates allowed is different for every person. A dietitian can help you calculate the amount that is right for you. Once you know the amount of carbohydrates you can have, you can count the carbohydrates in the foods you want to eat. Carbohydrates are found in the following foods:  Grains, such as breads and cereals.  Dried beans and soy products.  Starchy vegetables, such as potatoes, peas, and corn.  Fruit and fruit juices.  Milk and yogurt.  Sweets and snack foods, such as cake, cookies, candy, chips, soft drinks, and fruit drinks. CARBOHYDRATE COUNTING There are two ways to count the carbohydrates in your food. You can use either of the methods or a combination of both. Reading the "Nutrition Facts" on Packaged Food The "Nutrition Facts" is an area that is included on the labels of almost all packaged food and beverages in the [place]. It includes the serving size of that food or beverage and information about the nutrients in each serving of the food, including the grams (g) of carbohydrate per serving.  Decide the number of servings of this food or beverage that you will be able to eat or drink. Multiply that number of servings by the number of grams of carbohydrate that is listed on the label for that serving. The total will be the amount of carbohydrates you will be having when you eat or drink this food or beverage. Learning Standard Serving Sizes of Food When you eat food that is not packaged or does not include "Nutrition Facts" on the label, you need to measure the servings in order to count the amount of carbohydrates.A serving of most carbohydrate-rich foods contains about 15 g of carbohydrates. The following list includes serving sizes of carbohydrate-rich foods that  provide 15 g ofcarbohydrate per serving:   1 slice of bread (1 oz) or 1 six-inch tortilla.    of a hamburger bun or English muffin.  4-6 crackers.   cup unsweetened dry cereal.    cup hot cereal.   cup rice or pasta.    cup mashed potatoes or  of a large baked potato.  1 cup fresh fruit or one small piece of fruit.    cup canned or frozen fruit or fruit juice.  1 cup milk.   cup plain fat-free yogurt or yogurt sweetened with artificial sweeteners.   cup cooked dried beans or starchy vegetable, such as peas, corn, or potatoes.  Decide the number of standard-size servings that you will eat. Multiply that number of servings by 15 (the grams of carbohydrates in that serving). For example, if you eat 2 cups of strawberries, you will have eaten 2 servings and 30 g of carbohydrates (2 servings x 15 g = 30 g). For foods such as soups and casseroles, in which more than one food is mixed in, you will need to count the carbohydrates in each food that is included. EXAMPLE OF CARBOHYDRATE COUNTING Sample Dinner  3 oz chicken breast.   cup of brown rice.   cup of corn.  1 cup milk.   1 cup strawberries with sugar-free whipped topping.  Carbohydrate Calculation Step   1: Identify the foods that contain carbohydrates:   Rice.   Corn.   Milk.   Strawberries. Step 2:Calculate the number of servings eaten of each:   2 servings of rice.   1 serving of corn.   1 serving of milk.   1 serving of strawberries. Step 3: Multiply each of those number of servings by 15 g:   2 servings of rice x 15 g = 30 g.   1 serving of corn x 15 g = 15 g.   1 serving of milk x 15 g = 15 g.   1 serving of strawberries x 15 g = 15 g. Step 4: Add together all of the amounts to find the total grams of carbohydrates eaten: 30 g + 15 g + 15 g + 15 g = 75 g.   This information is not intended to replace advice given to you by your health care provider. Make sure you  discuss any questions you have with your health care provider.   Document Released: 07/28/2005 Document Revised: 08/18/2014 Document Reviewed: 06/24/2013 Elsevier Interactive Patient Education 2016 Elsevier Inc.  

## 2016-03-27 NOTE — Progress Notes (Signed)
Chris [name], is a 50 y.o. male  FEO:[id number]  TGP:[id number]  DOB - Aug 17, 1965  Chief Complaint  Patient presents with  . Pain      Subjective:   Chris [name] a 50 y.o. male with multiple medical history including COPD on home oxygen, obstructive sleep apnea, type 2 diabetes mellitus, HIV infection, erectile dysfunction, morbid obesity and ongoing nicotine abuse here today for a follow up visit and medication refill. Patient is complaining of ongoing left heel pain and knee pains. He is requesting refill of his Tylenol 3. He claims he is able to breathe better now although he is still on oxygen. He continues to smoke cigarette but cutting down on quantity. His other complaint today is oral thrush that is making it difficult to eat, started since using prednisone as part of treatment for his COPD. Patient has No headache, No chest pain, No abdominal pain - No Nausea, No new weakness tingling or numbness.  Problem  Candidiasis of Mouth  Hypoxia (Resolved)  Respiratory Distress (Resolved)  Acute Respiratory Failure With Hypoxia (Hcc) (Resolved)  Abscess of Right Breast (Resolved)  Pneumonia (Resolved)  Hcap (Healthcare-Associated Pneumonia) (Resolved)  Dyspnea (Resolved)  Acute On Chronic Respiratory Failure (Hcc) (Resolved)  Copd Exacerbation (Hcc) (Resolved)   ALLERGIES: Allergies  Allergen Reactions  . Bactrim [Sulfamethoxazole-Trimethoprim] Hives   PAST MEDICAL HISTORY: Past Medical History:  Diagnosis Date  . COPD (chronic obstructive pulmonary disease) ([place])   . Diabetes mellitus without complication ([place])   . Emphysema   . HIV disease ([place])    MEDICATIONS AT HOME: Prior to Admission medications   Medication Sig Start Date End Date Taking? Authorizing Provider  ACCU-CHEK SOFTCLIX LANCETS lancets Use as instructed 01/03/16  Yes Tresa Garter, MD  acetaminophen-codeine (TYLENOL #3) 300-30 MG tablet Take 1 tablet by mouth every 8 (eight) hours as needed for  moderate pain. 03/27/16  Yes Tresa Garter, MD  albuterol (PROVENTIL HFA;VENTOLIN HFA) 108 (90 Base) MCG/ACT inhaler Inhale 2 puffs into the lungs every 4 (four) hours as needed for wheezing or shortness of breath (((PLAN B))). 01/03/16  Yes Suzane Vanderweide E Doreene Burke, MD  Blood Glucose Monitoring Suppl (ACCU-CHEK AVIVA PLUS) W/DEVICE KIT 250 Test up to 4 times daily 04/28/14  Yes Tresa Garter, MD  budesonide-formoterol (SYMBICORT) 80-4.5 MCG/ACT inhaler Take 2 puffs first thing in am and then another 2 puffs about 12 hours later. 02/29/16  Yes Tanda Rockers, MD  cetirizine (ZYRTEC) 10 MG tablet Take 1 tablet (10 mg total) by mouth daily. 02/14/16  Yes Arnoldo Morale, MD  fluconazole (DIFLUCAN) 150 MG tablet Take 1 tablet (150 mg total) by mouth daily. 03/27/16 04/03/16 Yes Nickalos Petersen E Doreene Burke, MD  glucose blood test strip Use as instructed 04/20/14  Yes Roe Koffman E Doreene Burke, MD  insulin glargine (LANTUS) 100 UNIT/ML injection Inject 0.2 mLs (20 Units total) into the skin at bedtime. 09/04/15  Yes Arnoldo Morale, MD  Insulin Pen Needle 32G X 5 MM MISC Use as directed 06/12/14  Yes Lanora Reveron E Doreene Burke, MD  Insulin Syringe-Needle U-100 (BD INSULIN SYRINGE ULTRAFINE) 31G X 15/64" 0.5 ML MISC 1 each by Does not apply route at bedtime. 01/24/16  Yes Arnoldo Morale, MD  ipratropium-albuterol (DUONEB) 0.5-2.5 (3) MG/3ML SOLN Take 3 mLs by nebulization every 6 (six) hours as needed. 01/24/16  Yes Arnoldo Morale, MD  losartan (COZAAR) 25 MG tablet Take 1 tablet (25 mg total) by mouth daily. 02/14/16  Yes Arnoldo Morale, MD  metFORMIN (GLUCOPHAGE) 500 MG  tablet Take 2 tablets (1,000 mg total) by mouth 2 (two) times daily with a meal. 01/24/16  Yes Arnoldo Morale, MD  metoCLOPramide (REGLAN) 5 MG tablet Take 1 tablet (5 mg total) by mouth 3 (three) times daily before meals. 12/27/15  Yes Carlyle Basques, MD  OXYGEN 2lpm 24/7 [place]   Yes Historical Provider, MD  PREZISTA 800 MG tablet TAKE 1 TABLET BY MOUTH EVERY DAY WITH BREAKFAST  03/14/16  Yes Carlyle Basques, MD  sildenafil (VIAGRA) 50 MG tablet Take 0.5 tablets (25 mg total) by mouth daily as needed for erectile dysfunction. 06/18/15  Yes Carlyle Basques, MD  SPIRIVA RESPIMAT 2.5 MCG/ACT AERS INHALE 2 PUFFS BY MOUTH EVERY MORNING 03/11/16  Yes Tanda Rockers, MD  SPIRIVA RESPIMAT 2.5 MCG/ACT AERS INHALE 2 PUFFS BY MOUTH EVERY MORNING 03/11/16  Yes Tanda Rockers, MD  STRIBILD 150-150-200-300 MG TABS tablet TAKE 1 TABLET BY MOUTH EVERY DAY WITH BREAKFAST 03/14/16  Yes Carlyle Basques, MD  Tiotropium Bromide Monohydrate (SPIRIVA RESPIMAT) 2.5 MCG/ACT AERS 2 pffs each am 02/29/16  Yes Tanda Rockers, MD   Objective:   Vitals:   03/27/16 1246  BP: 113/67  Pulse: 85  Resp: 18  Temp: 98.5 F (36.9 C)  TempSrc: Oral  SpO2: 95%  Weight: 274 lb 12.8 oz (124.6 kg)  Height: '5\' 9"'  (1.753 m)    Exam General appearance : Awake, alert, not in any distress. Speech Clear. Not toxic looking HEENT: Atraumatic and Normocephalic, pupils equally reactive to light and accomodation Neck: Supple, no JVD. No cervical lymphadenopathy.  Chest: Good air entry bilaterally, no added sounds  CVS: S1 S2 regular, no murmurs.  Abdomen: Bowel sounds present, Non tender and not distended with no gaurding, rigidity or rebound. Extremities: B/L Lower Ext shows no edema, both legs are warm to touch Neurology: Awake alert, and oriented X 3, CN II-XII intact, Non focal Skin: No Rash  Data Review Lab Results  Component Value Date   HGBA1C 8.5 (H) 01/03/2016   HGBA1C 7.50 08/21/2015   HGBA1C 6.0 07/18/2015   Assessment & Plan   1. Type 2 diabetes mellitus with complication, with long-term current use of insulin (HCC)  - POCT A1C - Glucose (CBG) - Continue current regimen - For HbA1C next visit  2. Primary osteoarthritis of right knee Refill - acetaminophen-codeine (TYLENOL #3) 300-30 MG tablet; Take 1 tablet by mouth every 8 (eight) hours as needed for moderate pain.  Dispense: 90 tablet;  Refill: 0  3. Essential hypertension, benign  We have discussed target BP range and blood pressure goal. I have advised patient to check BP regularly and to call us back or report to clinic if the numbers are consistently higher than 140/90. We discussed the importance of compliance with medical therapy and DASH diet recommended, consequences of uncontrolled hypertension discussed.  - continue current BP medications  4. HIV disease ([place])  - Continue current medications per ID specialist - Follow up with Infectious Disease Specialist  5. Candidiasis of mouth Restart - fluconazole (DIFLUCAN) 150 MG tablet; Take 1 tablet (150 mg total) by mouth daily.  Dispense: 7 tablet; Refill: 0 - Follow up with ID  Patient have been counseled extensively about nutrition and exercise  Return in about 3 months (around 06/27/2016) for Hemoglobin A1C and Follow up, DM, Follow up HTN, Follow up Pain and comorbidities.  The patient was given clear instructions to go to ER or return to medical center if symptoms don't improve, worsen or new problems  develop. The patient verbalized understanding. The patient was told to call to get lab results if they haven't heard anything in the next week.   This note has been created with Surveyor, quantity. Any transcriptional errors are unintentional.    Angelica Chessman, MD, [place], [place], [place], [place] and [place], [place]   03/27/2016, 12:56 PM

## 2016-03-27 NOTE — Progress Notes (Signed)
Patient is here for FU thrush and Chronic Pain  Patient complains of thrush being present in his throat still.  Patient has taken medication today. Patient has not eaten.  Patient declined flu shot today.

## 2016-04-28 ENCOUNTER — Telehealth: Payer: Self-pay | Admitting: Internal Medicine

## 2016-04-28 ENCOUNTER — Encounter (HOSPITAL_COMMUNITY): Payer: Self-pay | Admitting: Emergency Medicine

## 2016-04-28 ENCOUNTER — Emergency Department (HOSPITAL_COMMUNITY)
Admission: EM | Admit: 2016-04-28 | Discharge: 2016-04-28 | Disposition: A | Discharge status: Home / Self Care | Payer: Medicare Other | Attending: Emergency Medicine | Admitting: Emergency Medicine

## 2016-04-28 ENCOUNTER — Ambulatory Visit: Payer: Medicare Other | Admitting: Internal Medicine

## 2016-04-28 DIAGNOSIS — J449 Chronic obstructive pulmonary disease, unspecified: Secondary | ICD-10-CM | POA: Insufficient documentation

## 2016-04-28 DIAGNOSIS — Z7984 Long term (current) use of oral hypoglycemic drugs: Secondary | ICD-10-CM | POA: Diagnosis not present

## 2016-04-28 DIAGNOSIS — Z794 Long term (current) use of insulin: Secondary | ICD-10-CM | POA: Insufficient documentation

## 2016-04-28 DIAGNOSIS — F17210 Nicotine dependence, cigarettes, uncomplicated: Secondary | ICD-10-CM | POA: Insufficient documentation

## 2016-04-28 DIAGNOSIS — Z79899 Other long term (current) drug therapy: Secondary | ICD-10-CM | POA: Insufficient documentation

## 2016-04-28 DIAGNOSIS — K029 Dental caries, unspecified: Secondary | ICD-10-CM | POA: Insufficient documentation

## 2016-04-28 DIAGNOSIS — K0889 Other specified disorders of teeth and supporting structures: Secondary | ICD-10-CM

## 2016-04-28 DIAGNOSIS — E119 Type 2 diabetes mellitus without complications: Secondary | ICD-10-CM | POA: Insufficient documentation

## 2016-04-28 DIAGNOSIS — I10 Essential (primary) hypertension: Secondary | ICD-10-CM | POA: Diagnosis not present

## 2016-04-28 IMAGING — DX DG CHEST 2V
2 series · 2 of 2 positions shown · non-contrast
Comparison: 01/05/2015

CLINICAL DATA: Cough, shortness of breath for 3 days. Back and
shoulder pain.

EXAM:
CHEST  2 VIEW

[chest pa]
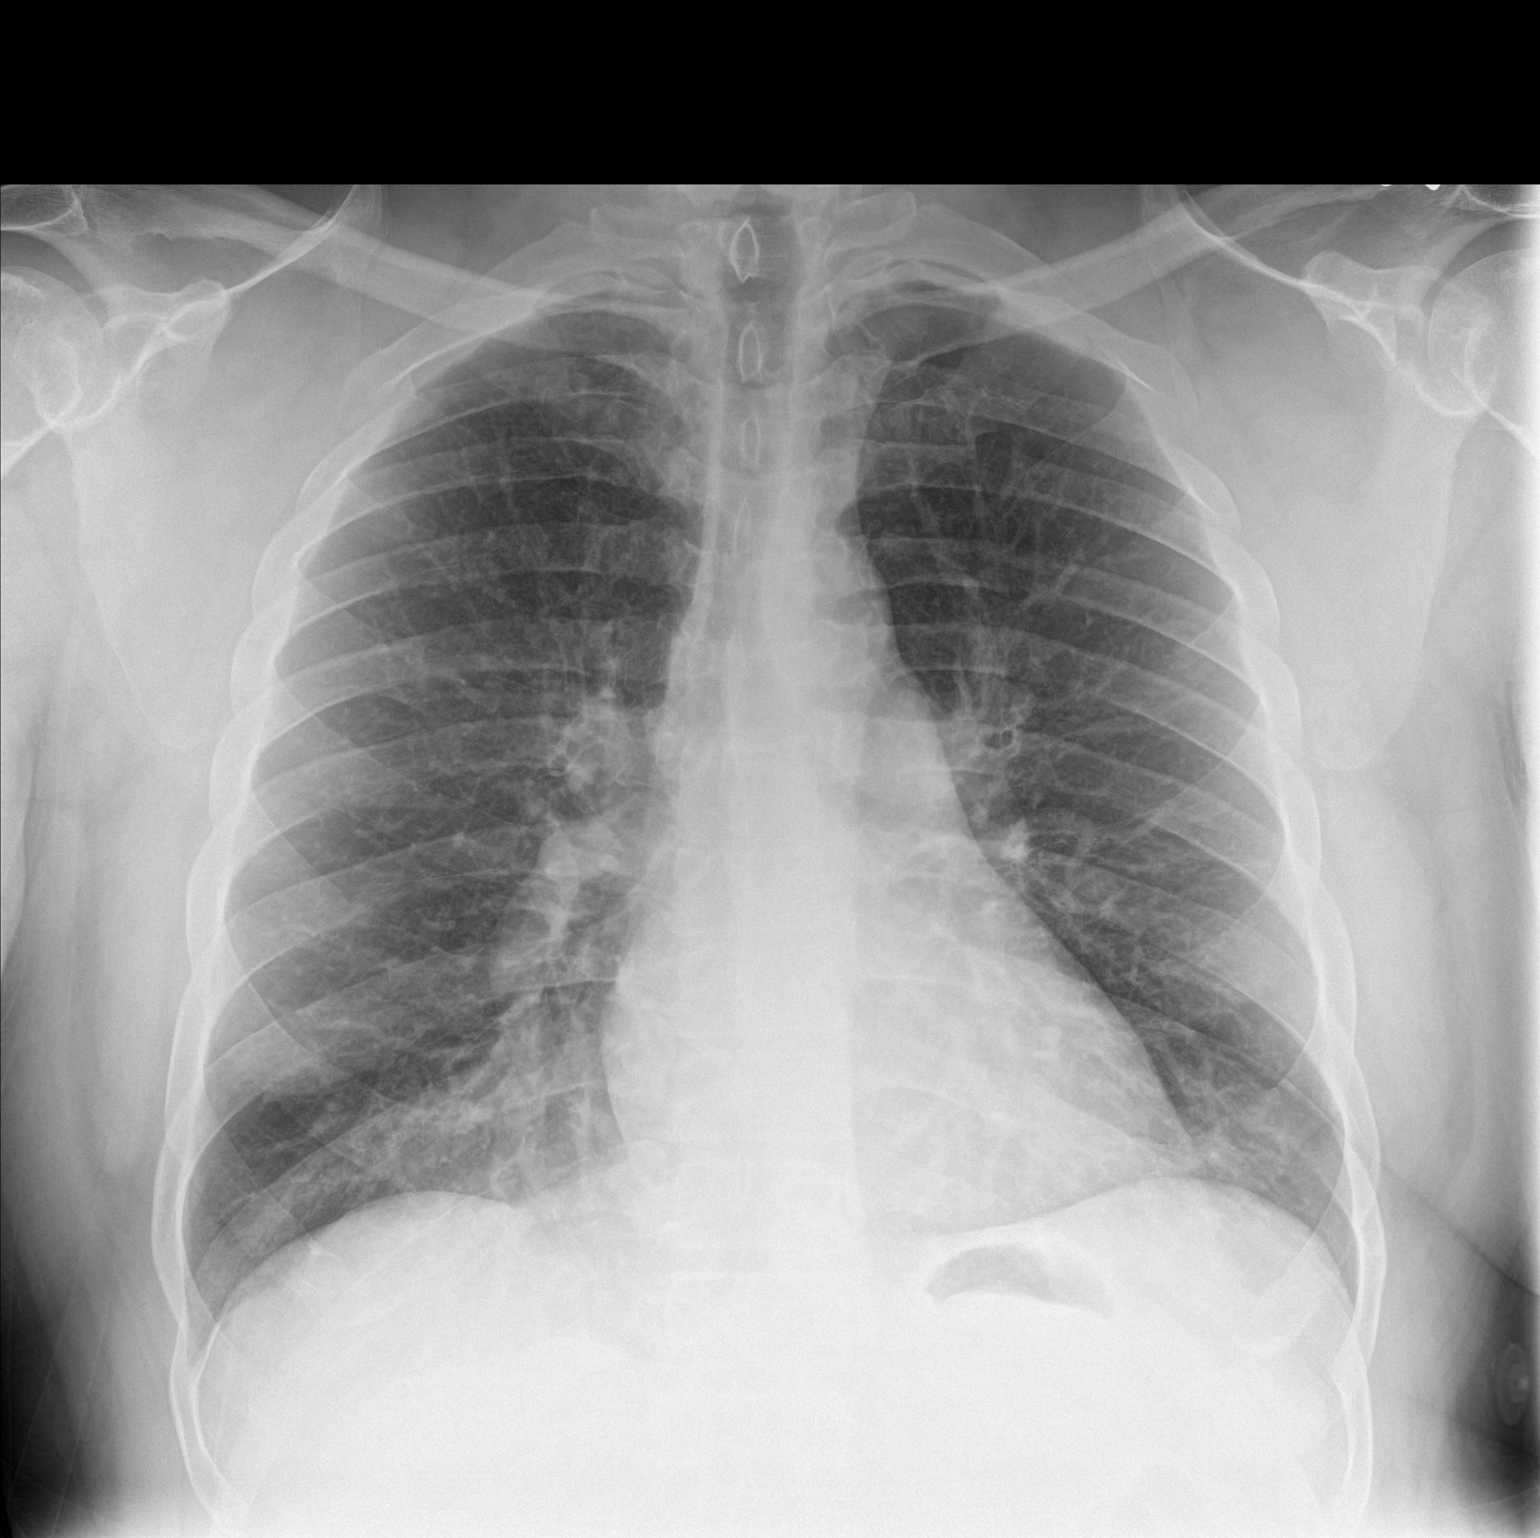

[chest lat]
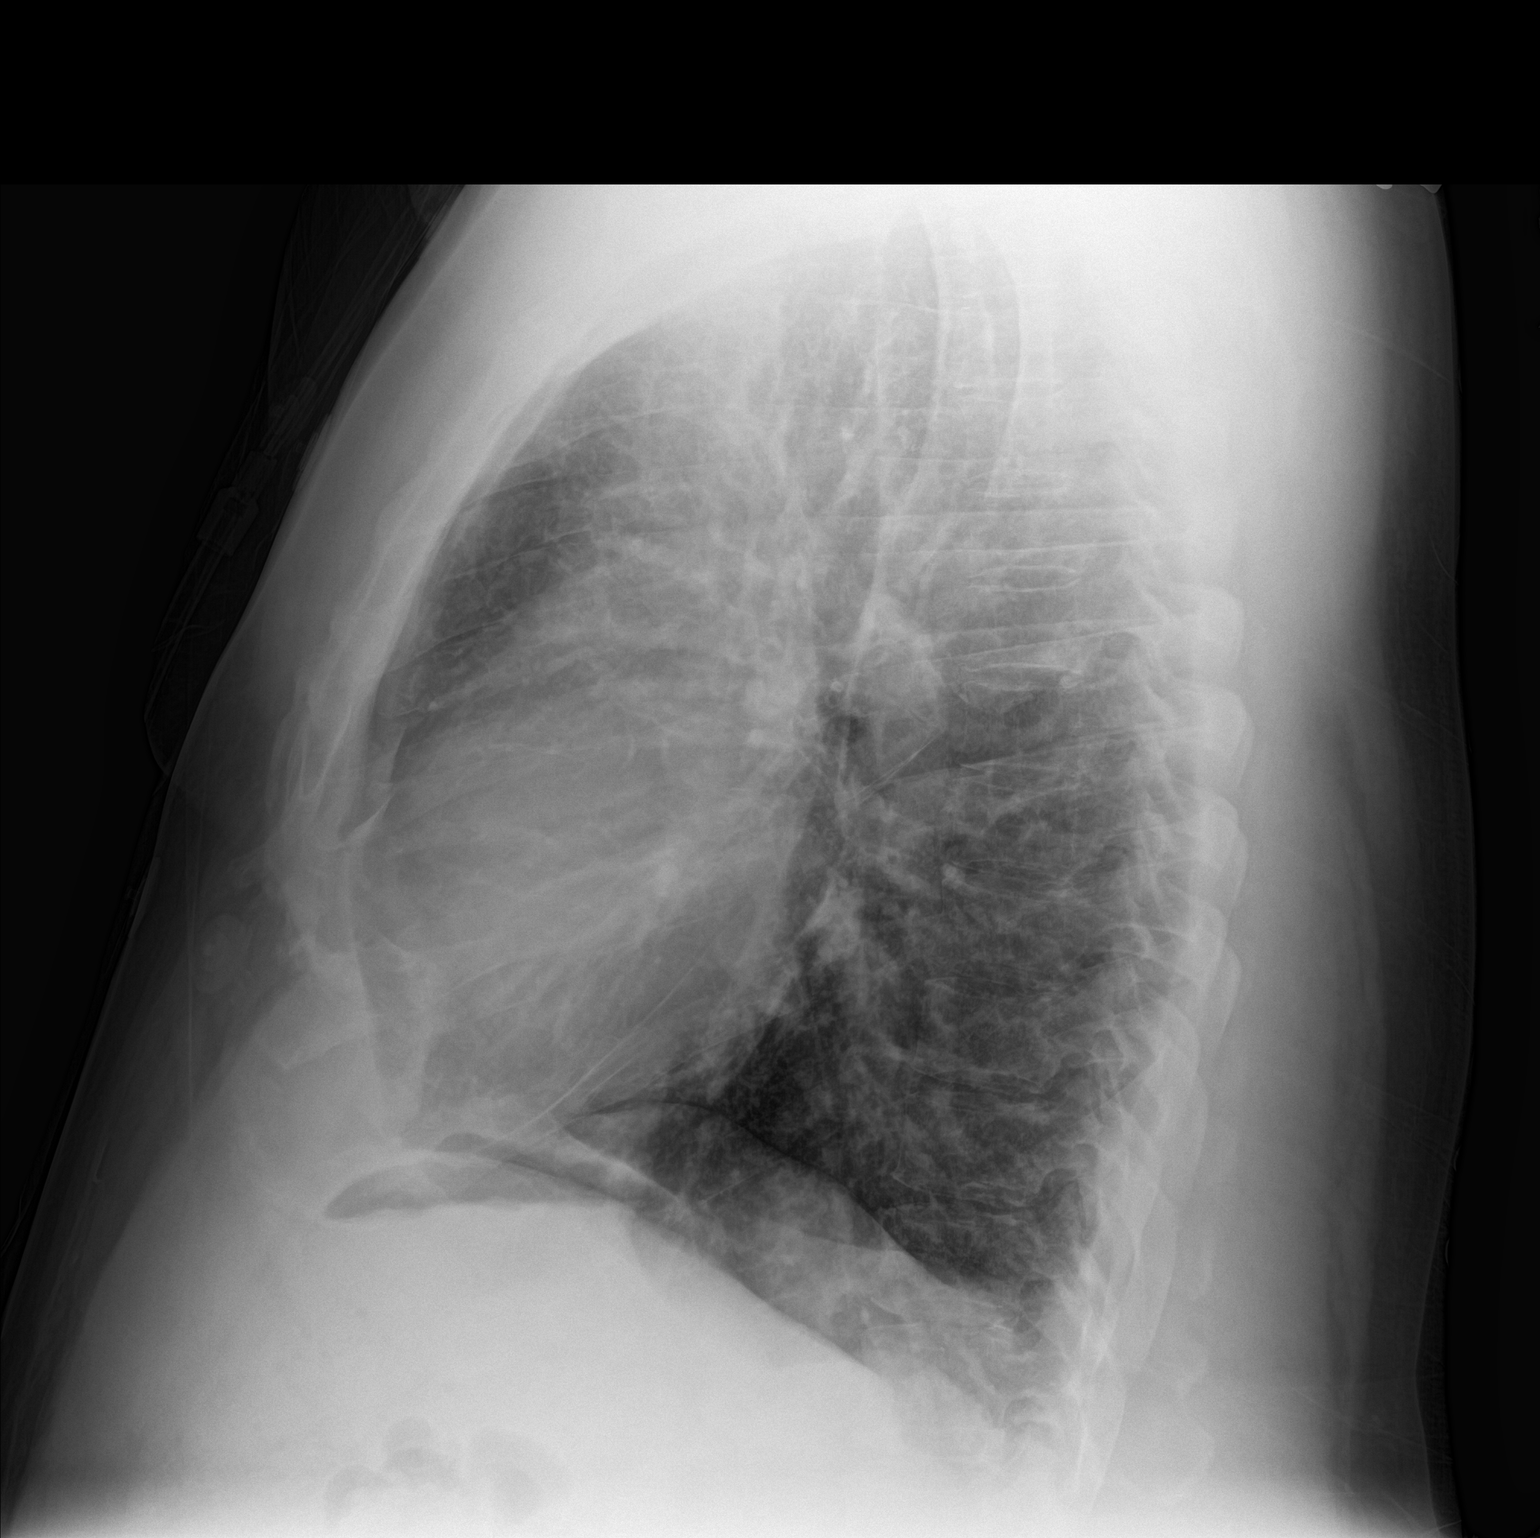

[2 of 2 positions shown; findings below may reference images not displayed]

FINDINGS: No confluent airspace opacities. Heart is normal size. No effusions
or acute bony abnormality.
IMPRESSION: No active cardiopulmonary disease.

## 2016-04-28 MED ORDER — PENICILLIN V POTASSIUM 500 MG PO TABS: 500.0000 mg | ORAL_TABLET | Freq: Three times a day (TID) | ORAL | 0 refills | Status: DC

## 2016-04-28 MED ORDER — ACETAMINOPHEN-CODEINE #3 300-30 MG PO TABS
1.0000 | ORAL_TABLET | Freq: Once | ORAL | Status: AC
  Administered 2016-04-28: 1 via ORAL
  Filled 2016-04-28: qty 1

## 2016-04-28 NOTE — ED Triage Notes (Signed)
C/o L lower toothache x 2 months.

## 2016-04-28 NOTE — Telephone Encounter (Signed)
Pt. Called requesting a refill on Tylenol # 3. Please f/u °
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
## 2016-04-28 NOTE — Discharge Instructions (Signed)
1. Medications: penicillin, usual home medications °2. Treatment: rest, drink plenty of fluids, take medications as prescribed °3. Follow Up: Please followup with dentistry within 1 week for discussion of your diagnoses and further evaluation after today's visit; if you do not have a primary care doctor use the resource guide provided to find one; Return to the ER for high fevers, difficulty breathing, difficulty swallowing or other concerning symptoms ° °

## 2016-04-29 ENCOUNTER — Encounter: Payer: Self-pay | Admitting: Podiatry

## 2016-04-29 ENCOUNTER — Ambulatory Visit (INDEPENDENT_AMBULATORY_CARE_PROVIDER_SITE_OTHER): Payer: Medicare Other | Admitting: Podiatry

## 2016-04-29 VITALS — BP 116/63 | HR 105 | Resp 16

## 2016-04-29 DIAGNOSIS — M79676 Pain in unspecified toe(s): Secondary | ICD-10-CM | POA: Diagnosis not present

## 2016-04-29 DIAGNOSIS — B351 Tinea unguium: Secondary | ICD-10-CM | POA: Diagnosis not present

## 2016-04-29 NOTE — Telephone Encounter (Signed)
Patient came to office to follow up on the call that he made yesterday regarding his medication refill for Tylenol 3. I informed patient that it takes 24-48 hours for nurse/doctor to follow up. Pt insisted that I submit a second request. Please follow up.   Thank you

## 2016-04-29 NOTE — Progress Notes (Signed)
Patient ID: Chris A Delisle Jr., male   DOB: 03/05/1966, 50 y.o.   MRN: [id number]     Subjective: This patient presents for scheduled visit complaining of uncomfortable and elongated toenails and walking wearing shoes and requests toenail debridement   Objective Orientated 3  Vascular: DP pulses 2/4 bilaterally PT pulses 2/4 bilaterally Capillary reflex immediate bilaterally  Neurological: Sensation to 10 g monofilament wire intact 5/5 right and 1/5 left Vibratory sensation equal and reactive bilaterally Ankle reflex equal and reactive bilaterally  Dermatological: Extremely dry plantar skin noted bilaterally The toenails are extremely elongated, hypertrophic, discolored, incurvated, and tender to palpation 6-10  Musculoskeletal: No deformities noted bilaterally There is no obvious restriction ankle, subtalar, midtarsal joints bilaterally   Assessment: Satisfactory vascular status Loss of protective sensation left Symptomatic onychomycoses 6-10 Type II diabetic Xerosis  HIV positive  Plan: Debridement toenails 10 without a bleeding Patient was advised to apply all-purpose skin lotion twice a day and at night also consider the use of Saran wrap with his sock to further reduce the excessive dryness in the skin Also, suggested the use a pumice stone or callus file after showering to reduce some of the scaling from dry skin on his plantar feet bilaterally  Reappoint 3 months  

## 2016-04-29 NOTE — Patient Instructions (Signed)
Diabetes and Foot Care Diabetes may cause you to have problems because of poor blood supply (circulation) to your feet and legs. This may cause the skin on your feet to become thinner, break easier, and heal more slowly. Your skin may become dry, and the skin may peel and crack. You may also have nerve damage in your legs and feet causing decreased feeling in them. You may not notice minor injuries to your feet that could lead to infections or more serious problems. Taking care of your feet is one of the most important things you can do for yourself.  HOME CARE INSTRUCTIONS  Wear shoes at all times, even in the house. Do not go barefoot. Bare feet are easily injured.  Check your feet daily for blisters, cuts, and redness. If you cannot see the bottom of your feet, use a mirror or ask someone for help.  Wash your feet with warm water (do not use hot water) and mild soap. Then pat your feet and the areas between your toes until they are completely dry. Do not soak your feet as this can dry your skin.  Apply a moisturizing lotion or petroleum jelly (that does not contain alcohol and is unscented) to the skin on your feet and to dry, brittle toenails. Do not apply lotion between your toes.  Trim your toenails straight across. Do not dig under them or around the cuticle. File the edges of your nails with an emery board or nail file.  Do not cut corns or calluses or try to remove them with medicine.  Wear clean socks or stockings every day. Make sure they are not too tight. Do not wear knee-high stockings since they may decrease blood flow to your legs.  Wear shoes that fit properly and have enough cushioning. To break in new shoes, wear them for just a few hours a day. This prevents you from injuring your feet. Always look in your shoes before you put them on to be sure there are no objects inside.  Do not cross your legs. This may decrease the blood flow to your feet.  If you find a minor scrape,  cut, or break in the skin on your feet, keep it and the skin around it clean and dry. These areas may be cleansed with mild soap and water. Do not cleanse the area with peroxide, alcohol, or iodine.  When you remove an adhesive bandage, be sure not to damage the skin around it.  If you have a wound, look at it several times a day to make sure it is healing.  Do not use heating pads or hot water bottles. They may burn your skin. If you have lost feeling in your feet or legs, you may not know it is happening until it is too late.  Make sure your health care provider performs a complete foot exam at least annually or more often if you have foot problems. Report any cuts, sores, or bruises to your health care provider immediately. SEEK MEDICAL CARE IF:   You have an injury that is not healing.  You have cuts or breaks in the skin.  You have an ingrown nail.  You notice redness on your legs or feet.  You feel burning or tingling in your legs or feet.  You have pain or cramps in your legs and feet.  Your legs or feet are numb.  Your feet always feel cold. SEEK IMMEDIATE MEDICAL CARE IF:   There is increasing redness,   swelling, or pain in or around a wound.  There is a red line that goes up your leg.  Pus is coming from a wound.  You develop a fever or as directed by your health care provider.  You notice a bad smell coming from an ulcer or wound.   This information is not intended to replace advice given to you by your health care provider. Make sure you discuss any questions you have with your health care provider.   Document Released: 07/25/2000 Document Revised: 03/30/2013 Document Reviewed: 01/04/2013 Elsevier Interactive Patient Education 2016 [employer]  

## 2016-04-30 NOTE — Telephone Encounter (Signed)
Patient requesting a refill on Tylenol 3

## 2016-05-01 ENCOUNTER — Ambulatory Visit: Payer: Medicare Other | Admitting: Internal Medicine

## 2016-05-01 ENCOUNTER — Other Ambulatory Visit: Payer: Self-pay | Admitting: Internal Medicine

## 2016-05-01 DIAGNOSIS — M1711 Unilateral primary osteoarthritis, right knee: Secondary | ICD-10-CM

## 2016-05-05 ENCOUNTER — Other Ambulatory Visit: Payer: Self-pay | Admitting: Internal Medicine

## 2016-05-05 DIAGNOSIS — M1711 Unilateral primary osteoarthritis, right knee: Secondary | ICD-10-CM

## 2016-05-05 MED ORDER — ACETAMINOPHEN-CODEINE #3 300-30 MG PO TABS: 1.0000 | ORAL_TABLET | Freq: Three times a day (TID) | ORAL | 0 refills | Status: DC | PRN

## 2016-05-26 IMAGING — CR DG CHEST 2V
2 series · 2 of 2 positions shown · non-contrast
Comparison: Chest radiograph performed 07/16/2015

CLINICAL DATA: Acute onset of shortness of breath. Initial
encounter.

EXAM:
CHEST  2 VIEW

[chest pa]
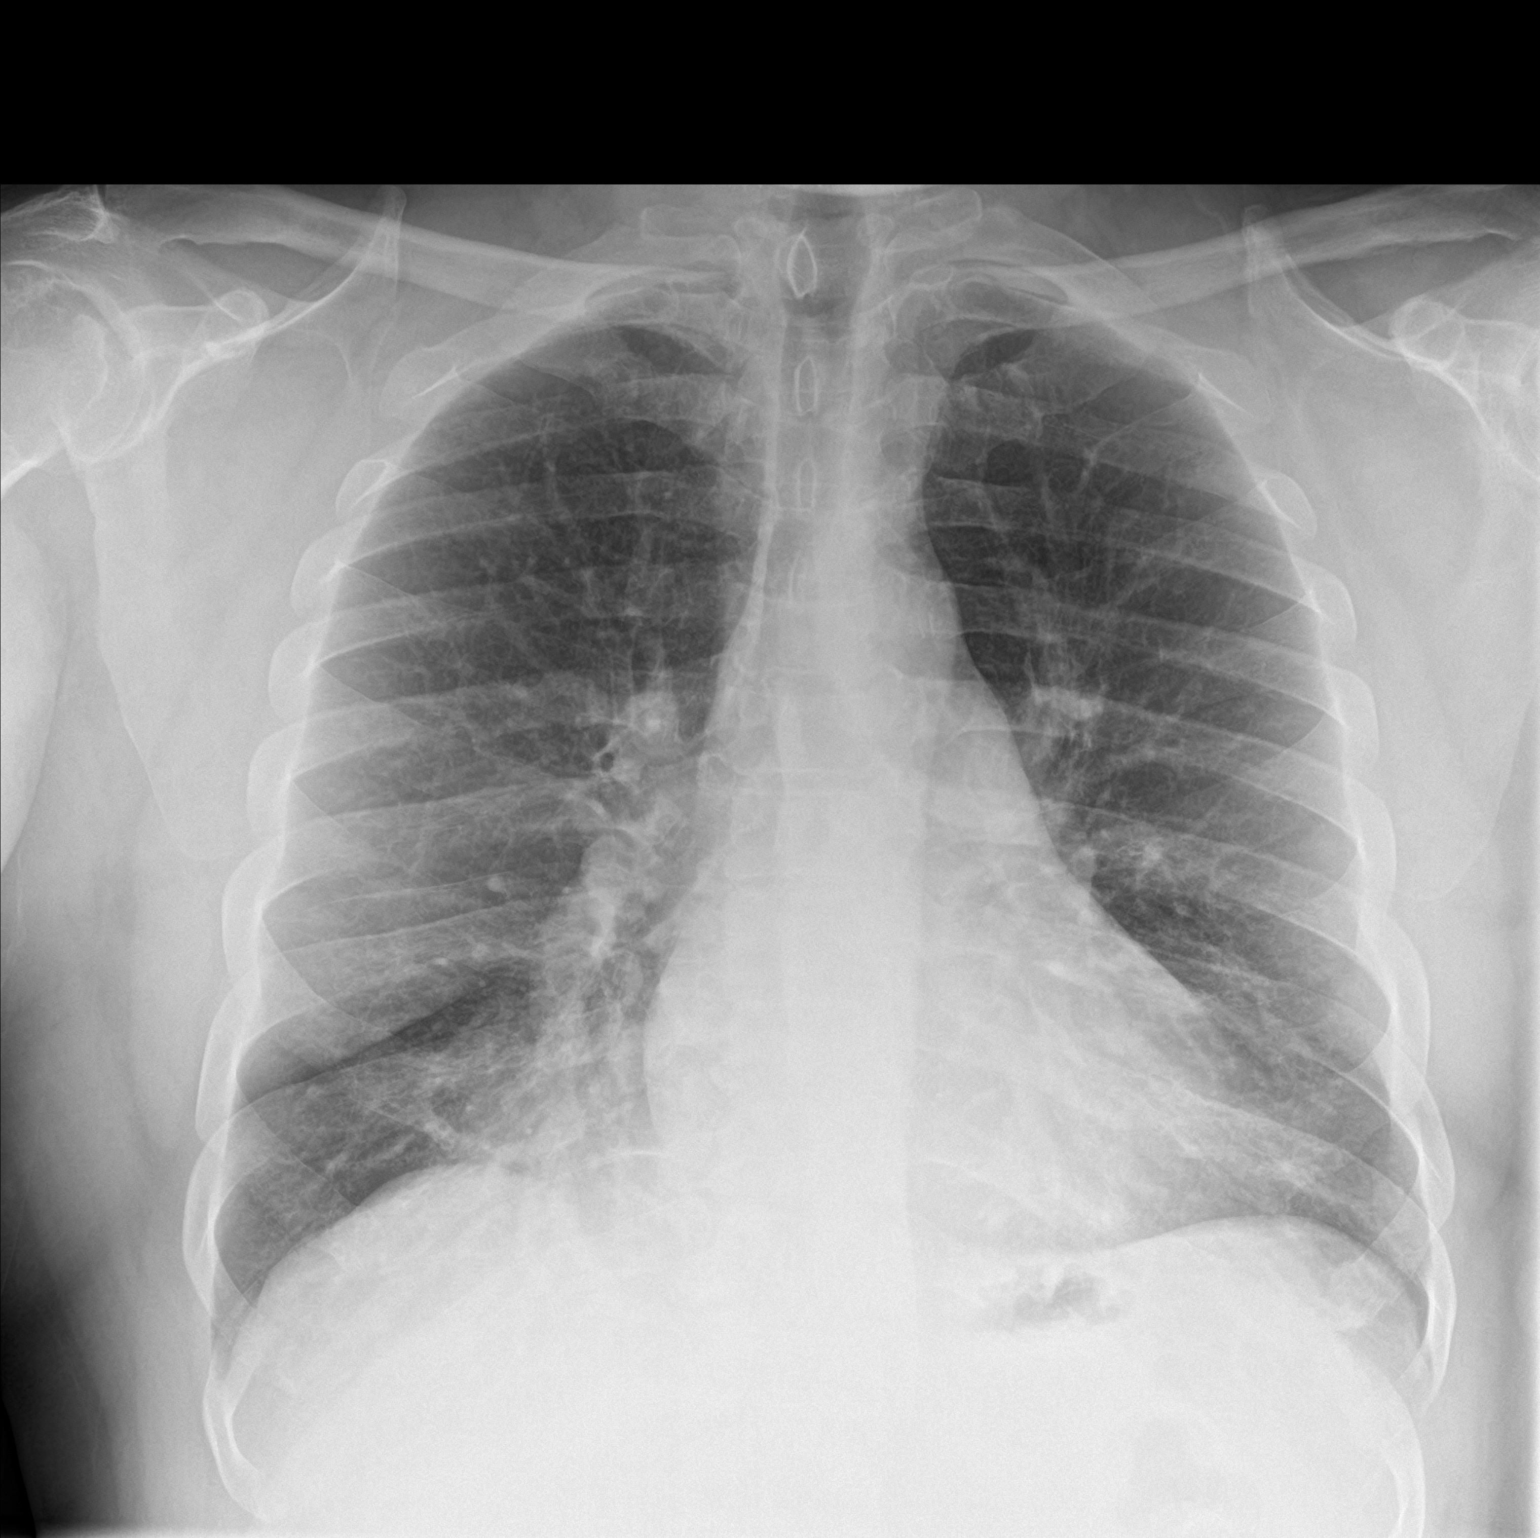

[chest lat]
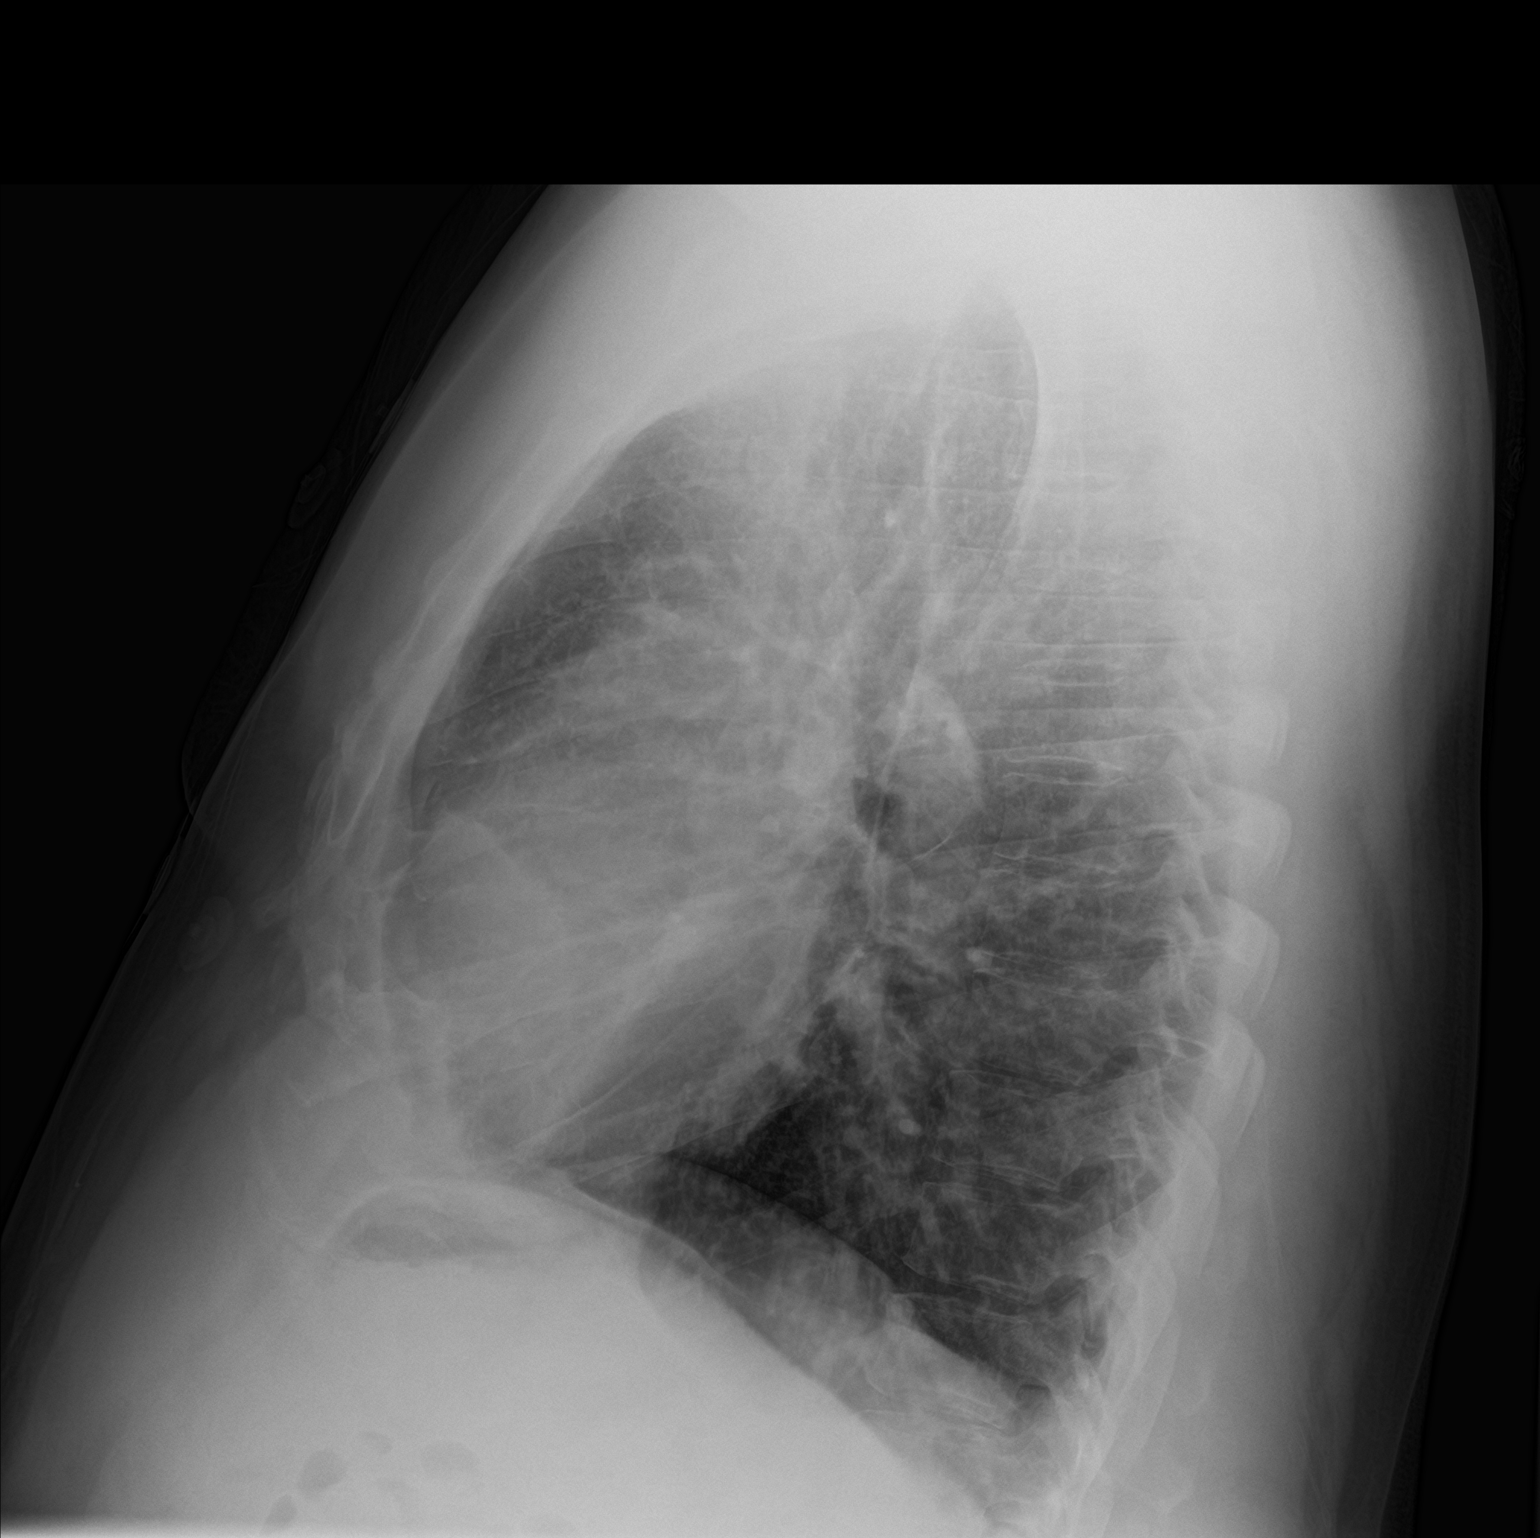

[2 of 2 positions shown; findings below may reference images not displayed]

FINDINGS: The lungs are well-aerated. Vascular congestion is noted. Mildly
increased interstitial markings may reflect minimal interstitial
edema. There is no evidence of pleural effusion or pneumothorax.

The heart is normal in size; the mediastinal contour is within
normal limits. No acute osseous abnormalities are seen.
IMPRESSION: Vascular congestion noted. Mildly increased interstitial markings
may reflect minimal interstitial edema.

## 2016-06-06 ENCOUNTER — Other Ambulatory Visit: Payer: Self-pay | Admitting: Internal Medicine

## 2016-06-06 ENCOUNTER — Other Ambulatory Visit: Payer: Self-pay | Admitting: Family Medicine

## 2016-06-06 DIAGNOSIS — F4321 Adjustment disorder with depressed mood: Secondary | ICD-10-CM

## 2016-06-10 ENCOUNTER — Telehealth: Payer: Self-pay | Admitting: Internal Medicine

## 2016-06-10 NOTE — Telephone Encounter (Signed)
Patient is requesting medication refill on his tylenol #3...[name].please follow-up

## 2016-06-11 ENCOUNTER — Telehealth: Payer: Self-pay | Admitting: Internal Medicine

## 2016-06-11 NOTE — Telephone Encounter (Signed)
Called the pharmacy and they stated that the pt has refills on the symbicort and the spiriva.  He has a no co-pay for these meds.  I called the pt back and he is aware of this. Nothing further is needed.

## 2016-06-12 ENCOUNTER — Other Ambulatory Visit: Payer: Self-pay | Admitting: Internal Medicine

## 2016-06-12 DIAGNOSIS — M1711 Unilateral primary osteoarthritis, right knee: Secondary | ICD-10-CM

## 2016-06-12 MED ORDER — ACETAMINOPHEN-CODEINE #3 300-30 MG PO TABS: 1.0000 | ORAL_TABLET | Freq: Three times a day (TID) | ORAL | 0 refills | Status: DC | PRN

## 2016-06-17 ENCOUNTER — Ambulatory Visit (INDEPENDENT_AMBULATORY_CARE_PROVIDER_SITE_OTHER): Payer: Medicare Other | Admitting: Internal Medicine

## 2016-06-17 ENCOUNTER — Encounter: Payer: Self-pay | Admitting: Internal Medicine

## 2016-06-17 VITALS — BP 132/80 | HR 114 | Ht 67.0 in | Wt 277.0 lb

## 2016-06-17 DIAGNOSIS — J449 Chronic obstructive pulmonary disease, unspecified: Secondary | ICD-10-CM | POA: Diagnosis not present

## 2016-06-17 DIAGNOSIS — F17210 Nicotine dependence, cigarettes, uncomplicated: Secondary | ICD-10-CM | POA: Diagnosis not present

## 2016-06-17 LAB — PULMONARY FUNCTION TEST
DL/VA % pred: 86 %
DL/VA: 3.85 ml/min/mmHg/L
DLCO UNC: 14.46 ml/min/mmHg
DLCO cor % pred: 49 %
DLCO cor: 14.01 ml/min/mmHg
DLCO unc % pred: 51 %
FEF 25-75 POST: 1.1 L/s
FEF 25-75 Pre: 0.42 L/sec
FEF2575-%Change-Post: 161 %
FEF2575-%PRED-PRE: 13 %
FEF2575-%Pred-Post: 35 %
FEV1-%Change-Post: 43 %
FEV1-%PRED-POST: 42 %
FEV1-%PRED-PRE: 29 %
FEV1-POST: 1.29 L
FEV1-Pre: 0.9 L
FEV1FVC-%Change-Post: 14 %
FEV1FVC-%PRED-PRE: 70 %
FEV6-%Change-Post: 28 %
FEV6-%PRED-POST: 55 %
FEV6-%PRED-PRE: 42 %
FEV6-POST: 2.01 L
FEV6-Pre: 1.56 L
FEV6FVC-%CHANGE-POST: 2 %
FEV6FVC-%PRED-POST: 103 %
FEV6FVC-%Pred-Pre: 100 %
FVC-%CHANGE-POST: 25 %
FVC-%PRED-POST: 53 %
FVC-%PRED-PRE: 42 %
FVC-POST: 2.01 L
FVC-PRE: 1.6 L
POST FEV6/FVC RATIO: 100 %
PRE FEV1/FVC RATIO: 56 %
PRE FEV6/FVC RATIO: 97 %
Post FEV1/FVC ratio: 64 %
RV % pred: 181 %
RV: 3.4 L
TLC % PRED: 83 %
TLC: 5.3 L

## 2016-06-17 MED ORDER — BUDESONIDE-FORMOTEROL FUMARATE 160-4.5 MCG/ACT IN AERO: INHALATION_SPRAY | RESPIRATORY_TRACT | 12 refills | Status: DC

## 2016-06-17 NOTE — Patient Instructions (Signed)
Change symbicort to 160 Take 2 puffs first thing in am and then another 2 puffs about 12 hours later.    and contiue spiriva 2 puffs each am    Please schedule a follow up visit in 3 months but call sooner if needed

## 2016-06-17 NOTE — Progress Notes (Signed)
Subjective:    Patient ID: Chris [name], male    DOB: 1965/08/18   MRN: [id number]    Brief patient profile:  50 yobm active smoker with HIV  and doe x 2009 much worse since March 2014 so referred by [name] to pulmonary clinic 02/15/2013 with GOLD III copd by pfts 04/28/13.   History of Present Illness  02/15/2013 1st pulmonary eval  Cc indolent onset progressive doe x walking slow pace more than 100 ft,  not at rest., 02 dep, doe much worse x 3 months assoc with congested cough esp in am with sev tbsp thick white mucus takes about 30 min to clear.  No better on inhalers tried to date including advair and spiriva - albuterol works the best and using lots of saba and nebs rec Stop advair and spiriva Plan A = Automatic = Start symbicort 160 Take 2 puffs first thing in am and then another 2 puffs about 12 hours later.  Plan B = backup= Only use your albuterol (as a rescue medication to be used if you can't catch your breath by resting or doing a relaxed purse lip breathing pattern. The less you use it, the better it will work when you need it. Ok to use up to 2 puffs every 4 hours Plan C = nebulizer, use this only if plan B doesn't work ok to use up to 4 hours  03/15/2013 f/u ov/Chris [name] still smoking  Chief Complaint  Patient presents with  . 4 wk follow up    Breathing worse at times.  Wheezing, chest tightness at times, and cough with yellow to clear mucus at times also reported.  Fatigues easily.   has not followed the previous instructions and symptoms have not really changed  rec Plan A = Automatic = Start symbicort 160 Take 2 puffs first thing in am and then another 2 puffs about 12 hours later.  Plan B = backup= Only use your albuterol (proiare) as a rescue medication to be used if you can't catch your breath by resting or doing a relaxed purse lip breathing pattern. The less you use it, the better it will work when you need it. Ok to use up to 2 puffs every 4 hours Plan C = nebulizer, use  this only if plan B doesn't work after 15 min ok to use up to 4 hours  The key is to stop smoking completely before smoking completely stops you!    04/28/2013 f/u ov/Chris [name] still smoking  re:  GOLD III/ 02 dep @ 2lpm  Chief Complaint  Patient presents with  . Follow-up    Pt states that his breathing has improved, symbicort seems to be helping some. No new co's today. Using proair approx 4 times per wk.   still doe but has learned to pace himself and overall better but somewhat limited from desired activities even on his best days rec Plan A = Automatic =  symbicort 160 Take 2 puffs first thing in am and then another 2 puffs about 12 hours later.  Add spiriva one capsule daily each am Plan B = backup= Only use your albuterol (proiare) as a rescue medication to be used if you can't catch your breath by resting or doing a relaxed purse lip breathing pattern. The less you use it, the better it will work when you need it. Ok to use up to 2 puffs every 4 hours Plan C = nebulizer, use this only if plan B doesn't work after  15 min ok to use up to 4 hours   03/14/2014 Follow up and Medication review  COPD GOLD III/ smoking only 2x daily / 02 2lpm 24/7/ symbicort  Patient returns for a two-week followup and medication review. We reviewed all his medications. Organized them into a medication calendar with patient education. Patient left a few of his medications at home.  Feels th at his breathing has improved since on Spiriva.  Patient has cut back on smoking. We discussed smoking cessation. Pt has had frequent COPD flare with steroids used x 2 over last 2 months. We discussed sided effects and risk/benefit of frequent steroid use especially along with his HIV meds and ICS inhalers.He verbalized understanding.  rec Use medical med calendar   05/16/2014 f/u ov/Chris [name] re: copd III/ still smoking/ no med calendar /02 dep but not using  Chief Complaint  Patient presents with  . Follow-up    COPD; fatigue;  usually has O2 on 2L, did not bring with him today; sometimes wears  2L at night   does not need saba often, sleeps ok despite taking second dose of symbicort 8 h p first  Also supposed to be on maint spiriva but not clear he's using it  rec The key is to stop smoking completely before smoking completely stops you!  See calendar for specific medication    01/05/2015 f/u ov/Chris [name] re: GOLD III copd on Qvar 80 2bid /spiriva  Chief Complaint  Patient presents with  . Follow-up    Pt states that his breathing is doing well overall. He is using albuterol inhaler 3 x per wk on average and uses neb usually once per day.   using proair one or twice and not nebulizer at all  rec 02 can just be used as needed The key is to stop smoking completely before smoking completely stops you!   Admit date: 01/17/2016 Discharge date: 01/18/2016  Time spent: > 35 minutes  Recommendations for Outpatient Follow-up:  1. Please decide whether or not patient will require a more prolonged steroid regimen   Discharge Diagnoses:  Active Problems:  Copd exacerbation   Discharge Condition: stable  Diet recommendation: carb modified diet  Filed Weights   01/18/16 0104  Weight: 122.471 kg (270 lb)    History of present illness:  50 year old male with history of DM, HIV, COPD on home oxygen, OSA, ED, obesity who came with cc of dyspnea that started 4 days ago and has been getting worse associated with cough productive of yellowish sputum but no chest pain. No fever or chills. No preceding upper respiratory symptoms. He has mild generalized abdominal pain but no nausea, vomiting, diarrhea or constipation. No other complaints.   Hospital Course:  COPD exacerbation - Improved with steroid and antibiotic administration - Patient much improved and requesting discharge. He has no new complaints. - Continue prednisone for 4 more days to complete a 5 day treatment course. - Should patient's condition  worsen he is to go get a reevaluation with his pcp, urgent care, or ED. Patient verbalizes understanding and agreement. - He assures me that he has oxygen at home.       01/28/2016 extended  f/u ov/re-establish care p Admit/Chris [name] re: copd G III/ qvar 80 2bid and spiriva and still smoking  Chief Complaint  Patient presents with  . Acute Visit    Pt c/o increased SOB for the past 2 months. He also has had increased cough with yellow sputum.  He is using  albuterol inhaler 3 x daily on average and Duoneb at least 2 x daily.    doe = MMRC3 = can't walk 100 yards even at a slow pace at a flat grade s stopping due to sob  Even on 02/ sleeps ok/ was placed on acei but denies taking it   Losing ground again since admit  rec Prednisone 10 mg take  4 each am x 2 days,   2 each am x 2 days,  1 each am x 2 days and stop  Stop all smoking permanently  Plan A = Automatic = Stiolto 2 puffs each am and stop spiriva                                      Continue qvar 80 Take 2 puffs first thing in am and then another 2 puffs about 12 hours later.  Work on inhaler technique:   Plan B = Backup Only use your albuterol as a rescue medication Plan C = Crisis - only use your albuterol nebulizer if you first try Plan B and it fails to help > ok to use the nebulizer up to every 4 hours but if start needing it regularly call for immediate appointment    02/29/2016  f/u ov/Chris [name] re:  Copd GOLD III/ symbicort and stiolto / 02 prn / still smoking Chief Complaint  Patient presents with  . Follow-up    Pt states stopped qvar and started back on symbicort and his breathing has improved. He has not had to use albuterol inhaler but uses duoneb once per day. Still smoking 2 cigs per day.   doe = MMRC3 = can't walk 100 yards even at a slow pace at a flat grade s stopping due to sob even on  02  Confused with meds/ instructions : taking two labas now  rec Plan A = Automatic =  Symbicort 80 Take 2 puffs first thing in am and  then another 2 puffs about 12 hours later   And spiriva 2 puffs each am (sample = 4 pffs)  Plan B = Backup Only use your albuterol (ventolin)  as a rescue medication Plan C = Crisis - only use your albuterol nebulizer if you first try Plan B and it fails to help > ok to use the nebulizer up to every 4 hours but if start needing it regularly call for immediate appointment Ok to leave off 02 at rest but wear it at bedtime and as needed daytime    06/17/2016  f/u ov/Chris [name] re: GOLD III COPD/ large reversible component  Chief Complaint  Patient presents with  . Follow-up    PFT's done today. Pt states breathing is "alright, I get around pretty good". He states only uses albuterol inhaler evrey other day and neb about 2 x per wk on average.     No obvious day to day or daytime variabilty or assoc excess/ purulent sputum or mucus plugs   or cp or chest tightness, subjective wheeze overt sinus or hb symptoms. No unusual exp hx or h/o childhood pna/ asthma or knowledge of premature birth.  Sleeping ok without nocturnal  or early am exacerbation  of respiratory  c/o's or need for noct saba. Also denies any obvious fluctuation of symptoms with weather or environmental changes or other aggravating or alleviating factors except as outlined above   Current Medications, Allergies, Complete Past Medical History,  Past Surgical History, Family History, and Social History were reviewed in [employer] record.  ROS  The following are not active complaints unless bolded sore throat, dysphagia, dental problems, itching, sneezing,  nasal congestion or excess/ purulent secretions, ear ache,   fever, chills, sweats, unintended wt loss, pleuritic or exertional cp, hemoptysis,  orthopnea pnd or leg swelling, presyncope, palpitations, heartburn, abdominal pain, anorexia, nausea, vomiting, diarrhea  or change in bowel or urinary habits, change in stools or urine, dysuria,hematuria,  rash, arthralgias,  visual complaints, headache, numbness weakness or ataxia or problems with walking or coordination,  change in mood/affect or memory.                     Objective:   Physical Exam   amb bm nad / vital signs reviewed  - Note on arrival 02 sats  99% on RA     01/05/2015       278 >  02/29/2016  274 > 06/17/2016 277  01/28/2016        272   05/16/14 285 lb 6.4 oz (129.457 kg)  04/20/14 282 lb 12.8 oz (128.277 kg)  03/14/14 274 lb 3.2 oz (124.376 kg)     HEENT mild turbinate edema.  Oropharynx no thrush or excess pnd or cobblestoning.  No JVD or cervical adenopathy. Mild accessory muscle hypertrophy. Trachea midline, nl thryroid. Chest was hyperinflated by percussion with diminished breath sounds and moderate increased exp time with no sign exp bilateral  wheeze. Hoover sign positive at mid inspiration. Regular rate and rhythm without murmur gallop or rub or increase P2 or edema.  Abd: no hsm, nl excursion. Ext warm without cyanosis or clubbing.         I personally reviewed images and agree with radiology impression as follows:  CXR: 01/17/16 Lungs at least mildly hyperexpanded, compatible with given history of COPD/emphysema. Suspect associated chronic bronchitic changes centrally and chronic interstitial lung disease. 2. No acute findings. No evidence of pneumonia. 3. Stable mild cardiomegaly. No evidence of active CHF.      Assessment & Plan:

## 2016-06-18 NOTE — Assessment & Plan Note (Signed)
>   3 min Discussed the risks and costs (both direct and indirect)  of smoking relative to the benefits of quitting but patient unwilling to commit at this point to a specific quit date.    Although I don't endorse regular use of e cigs/ many pts find them helpful; however, I emphasized they should be considered a "one-way bridge" off all tobacco products.  

## 2016-06-18 NOTE — Assessment & Plan Note (Signed)
Body mass index is 43.38 still trending up  Lab Results  Component Value Date   TSH 1.507 08/21/2015     Contributing to gerd tendency/ doe/reviewed the need and the process to achieve and maintain neg calorie balance > defer f/u primary care including intermittently monitoring thyroid status

## 2016-06-18 NOTE — Assessment & Plan Note (Signed)
-   04/28/2013 PFT's FEV1 1.21 (39%) ratio 59 and 13% better p B2 and dlco 72 corrects to 102  - started spiriva 04/30/2013 > changed to respimat 02/28/2014 -Med calendar 03/14/2014 > not using as of 05/16/14  - arrived on stiolto / symbicort 02/29/2016 > changed to symbicort 80/spiriva (lower dose ICS due to interaction with aids meds   - PFT's  06/17/2016  FEV1 1.29 (42 % ) ratio 64  p 43 % improvement from saba p symb 80 /spiriva prior to study with DLCO  51/49c % corrects to 86  % for alv volume   - 06/17/2016  After extensive coaching HFA effectiveness =    90% > change symb to 160 2bid   Clearly has sign AB with also underlying fixed dz so best option is lama/laba and ICS in min dose given his HIV status and concern for exp to HIV drugs that could prolong ICS effects   Discussed in detail all the  indications, usual  risks and alternatives  relative to the benefits with patient who agrees to proceed with double the dose of ICS/ f/u q 3 m  I had an extended discussion with the patient reviewing all relevant studies completed to date and  lasting 15 to 20 minutes of a 25 minute visit    Each maintenance medication was reviewed in detail including most importantly the difference between maintenance and prns and under what circumstances the prns are to be triggered using an action plan format that is not reflected in the computer generated alphabetically organized AVS.    Please see instructions for details which were reviewed in writing and the patient given a copy highlighting the part that I personally wrote and discussed at today's ov.

## 2016-06-23 ENCOUNTER — Encounter: Payer: Self-pay | Admitting: Internal Medicine

## 2016-06-23 ENCOUNTER — Ambulatory Visit (INDEPENDENT_AMBULATORY_CARE_PROVIDER_SITE_OTHER): Payer: Medicare Other | Admitting: Internal Medicine

## 2016-06-23 VITALS — BP 122/86 | HR 95 | Temp 98.3°F | Ht 69.0 in | Wt 277.0 lb

## 2016-06-23 DIAGNOSIS — E119 Type 2 diabetes mellitus without complications: Secondary | ICD-10-CM | POA: Diagnosis not present

## 2016-06-23 DIAGNOSIS — Z23 Encounter for immunization: Secondary | ICD-10-CM

## 2016-06-23 DIAGNOSIS — J449 Chronic obstructive pulmonary disease, unspecified: Secondary | ICD-10-CM

## 2016-06-23 DIAGNOSIS — Z21 Asymptomatic human immunodeficiency virus [HIV] infection status: Secondary | ICD-10-CM | POA: Diagnosis present

## 2016-06-23 DIAGNOSIS — J4489 Other specified chronic obstructive pulmonary disease: Secondary | ICD-10-CM

## 2016-06-23 DIAGNOSIS — B20 Human immunodeficiency virus [HIV] disease: Secondary | ICD-10-CM | POA: Diagnosis not present

## 2016-06-23 DIAGNOSIS — Z794 Long term (current) use of insulin: Secondary | ICD-10-CM | POA: Diagnosis not present

## 2016-06-23 DIAGNOSIS — J441 Chronic obstructive pulmonary disease with (acute) exacerbation: Secondary | ICD-10-CM | POA: Diagnosis not present

## 2016-06-23 DIAGNOSIS — L0292 Furuncle, unspecified: Secondary | ICD-10-CM

## 2016-06-23 DIAGNOSIS — R6882 Decreased libido: Secondary | ICD-10-CM | POA: Diagnosis not present

## 2016-06-23 LAB — CBC WITH DIFFERENTIAL/PLATELET
BASOS PCT: 1 %
Basophils Absolute: 68 cells/uL (ref 0–200)
Eosinophils Absolute: 136 cells/uL (ref 15–500)
Eosinophils Relative: 2 %
HEMATOCRIT: 48.5 % (ref 38.5–50.0)
Hemoglobin: 15.4 g/dL (ref 13.2–17.1)
LYMPHS PCT: 35 %
Lymphs Abs: 2380 cells/uL (ref 850–3900)
MCH: 29.5 pg (ref 27.0–33.0)
MCHC: 31.8 g/dL — ABNORMAL LOW (ref 32.0–36.0)
MCV: 92.9 fL (ref 80.0–100.0)
MONO ABS: 680 {cells}/uL (ref 200–950)
MPV: 11 fL (ref 7.5–12.5)
Monocytes Relative: 10 %
NEUTROS ABS: 3536 {cells}/uL (ref 1500–7800)
Neutrophils Relative %: 52 %
PLATELETS: 205 10*3/uL (ref 140–400)
RBC: 5.22 MIL/uL (ref 4.20–5.80)
RDW: 13 % (ref 11.0–15.0)
WBC: 6.8 10*3/uL (ref 3.8–10.8)

## 2016-06-23 MED ORDER — DOXYCYCLINE HYCLATE 100 MG PO TABS: 100.0000 mg | ORAL_TABLET | Freq: Two times a day (BID) | ORAL | 0 refills | Status: DC

## 2016-06-23 MED ORDER — ELVITEG-COBIC-EMTRICIT-TENOFAF 150-150-200-10 MG PO TABS: 1.0000 | ORAL_TABLET | Freq: Every day | ORAL | 11 refills | Status: DC

## 2016-06-23 NOTE — Progress Notes (Signed)
RFV: follow for hiv disease  Patient ID: Chris [name]., male   DOB: [birth year], 50 y.o.   MRN: [id number]  HPI  50yo M with hiv disease,Cd 4 count of 230/VL<20 (feb 2017) currently on stribild-DRV. Also has IDDM, COPD, and obesity. He was seen by [name] on 11/08 where his inhalers were increased,including symbicort  He reports having a perineal boil htat started draining. 2 days ago, still having some purulent drainage but improving  Genotype: L74V,K101E,V118I,Y181C,G190S. Outpatient Encounter Prescriptions as of 06/23/2016  Medication Sig  . ACCU-CHEK SOFTCLIX LANCETS lancets Use as instructed  . acetaminophen (TYLENOL) 500 MG tablet Take 1,000 mg by mouth every 6 (six) hours as needed.  [name] acetaminophen-codeine (TYLENOL #3) 300-30 MG tablet Take 1 tablet by mouth every 8 (eight) hours as needed for moderate pain.  [name] albuterol (PROVENTIL HFA;VENTOLIN HFA) 108 (90 Base) MCG/ACT inhaler Inhale 2 puffs into the lungs every 4 (four) hours as needed for wheezing or shortness of breath (((PLAN B))).  . Blood Glucose Monitoring Suppl (ACCU-CHEK AVIVA PLUS) W/DEVICE KIT 250 Test up to 4 times daily  . budesonide-formoterol (SYMBICORT) 160-4.5 MCG/ACT inhaler Take 2 puffs first thing in am and then another 2 puffs about 12 hours later.  . cetirizine (ZYRTEC) 10 MG tablet Take 1 tablet (10 mg total) by mouth daily.  [name] glucose blood test strip Use as instructed  . insulin glargine (LANTUS) 100 UNIT/ML injection Inject 0.2 mLs (20 Units total) into the skin at bedtime.  . Insulin Pen Needle 32G X 5 MM MISC Use as directed  . Insulin Syringe-Needle U-100 (BD INSULIN SYRINGE ULTRAFINE) 31G X 15/64" 0.5 ML MISC 1 each by Does not apply route at bedtime.  [name] ipratropium-albuterol (DUONEB) 0.5-2.5 (3) MG/3ML SOLN Take 3 mLs by nebulization every 6 (six) hours as needed.  . metFORMIN (GLUCOPHAGE) 500 MG tablet Take 2 tablets (1,000 mg total) by mouth 2 (two) times daily with a meal.  .  metoCLOPramide (REGLAN) 5 MG tablet Take 1 tablet (5 mg total) by mouth 3 (three) times daily before meals.  . OXYGEN 2lpm 24/7 AHC  . PREZISTA 800 MG tablet TAKE 1 TABLET BY MOUTH EVERY DAY WITH BREAKFAST  . sildenafil (VIAGRA) 50 MG tablet Take 0.5 tablets (25 mg total) by mouth daily as needed for erectile dysfunction.  [name] SPIRIVA RESPIMAT 2.5 MCG/ACT AERS INHALE 2 PUFFS BY MOUTH EVERY MORNING  . STRIBILD 150-150-200-300 MG TABS tablet TAKE 1 TABLET BY MOUTH EVERY DAY WITH BREAKFAST  . DULoxetine (CYMBALTA) 20 MG capsule TAKE ONE CAPSULE BY MOUTH DAILY  . losartan (COZAAR) 25 MG tablet TAKE 1 TABLET BY MOUTH DAILY  . [DISCONTINUED] penicillin v potassium (VEETID) 500 MG tablet Take 1 tablet (500 mg total) by mouth 3 (three) times daily.   No facility-administered encounter medications on file as of 06/23/2016.      Patient Active Problem List   Diagnosis Date Noted  . Candidiasis of mouth 03/27/2016  . Adjustment disorder with depressed mood 01/04/2016  . Callus of foot 01/04/2016  . Hoarseness of voice 10/04/2015  . AIDS (acquired immunodeficiency syndrome) ([place]) 10/04/2015  . Type 2 diabetes mellitus with complication, with long-term current use of insulin ([place]) 10/04/2015  . Cigarette nicotine dependence without complication 99/37/1696  . Morbid (severe) obesity due to excess calories ([place]) 09/04/2015  . Primary osteoarthritis of right knee 03/26/2015  . Genital warts 01/22/2015  . Other male erectile dysfunction 01/22/2015  . Type 2 diabetes mellitus with complication (  [place]) 01/01/2015  . Essential hypertension, benign 12/04/2014  . Atopic eczema 12/04/2014  . Cyst (solitary) of breast 08/23/2013  . Breast abscess 08/23/2013  . OSA (obstructive sleep apnea) 08/23/2013  . Cigarette smoker 02/17/2013  . Hyperglycemia 02/17/2013  . Uncontrolled diabetes mellitus ([place]) 02/17/2013  . DM (diabetes mellitus) ([place]) 01/27/2013  . Steroid-induced hyperglycemia 01/12/2013  . Chronic  respiratory failure with hypoxia and hypercapnia ([place]) 01/12/2013  . COPD  GOLD III, still smoking 01/11/2013  . Nicotine abuse 01/11/2013  . HIV disease ([place]) 12/06/2012     Health Maintenance Due  Topic Date Due  . FOOT EXAM  04/12/1976  . OPHTHALMOLOGY EXAM  04/12/1976  . TETANUS/TDAP  04/12/1985  . COLONOSCOPY  04/12/2016     Review of Systems  Constitutional: Negative for fever, chills, diaphoresis, activity change, appetite change, fatigue and unexpected weight change.  HENT: Negative for congestion, sore throat, rhinorrhea, sneezing, trouble swallowing and sinus pressure.  Eyes: Negative for photophobia and visual disturbance.  Respiratory: Negative for cough, chest tightness, shortness of breath, wheezing and stridor.  Cardiovascular: Negative for chest pain, palpitations and leg swelling.  Gastrointestinal: Negative for nausea, vomiting, abdominal pain, diarrhea, constipation, blood in stool, abdominal distention and anal bleeding.  Genitourinary: Negative for dysuria, hematuria, flank pain and difficulty urinating.  Musculoskeletal: Negative for myalgias, back pain, joint swelling, arthralgias and gait problem.  Skin: +perineal abscess drainage Neurological: Negative for dizziness, tremors, weakness and light-headedness.  Hematological: Negative for adenopathy. Does not bruise/bleed easily.  Psychiatric/Behavioral: Negative for behavioral problems, confusion, sleep disturbance, dysphoric mood, decreased concentration and agitation.    Physical Exam   BP 122/86   Pulse 95   Temp 98.3 F (36.8 C) (Oral)   Ht _0  (1.753 m)   Wt 277 lb (125.6 kg)   SpO2 92%   BMI 40.91 kg/m  Physical Exam  Constitutional: He is oriented to person, place, and time. He appears well-developed and well-nourished. No distress.  HENT:  Mouth/Throat: Oropharynx is clear and moist. No oropharyngeal exudate.  Cardiovascular: Normal rate, regular rhythm and normal heart sounds. Exam  reveals no gallop and no friction rub.  No murmur heard.  Pulmonary/Chest: Effort normal and breath sounds normal. No respiratory distress. He has no wheezes.  Abdominal: Soft. Bowel sounds are normal. He exhibits no distension. There is no tenderness.  Lymphadenopathy:  He has no cervical adenopathy.  Neurological: He is alert and oriented to person, place, and time.  Skin: Skin is warm and dry. No rash noted. No erythema.  Psychiatric: He has a normal mood and affect. His behavior is normal.    Lab Results  Component Value Date   CD4TCELL 13 (L) 09/27/2015   Lab Results  Component Value Date   CD4TABS 230 (L) 09/27/2015   CD4TABS 90 (L) 08/14/2015   CD4TABS 340 (L) 06/18/2015   Lab Results  Component Value Date   HIV1RNAQUANT <20 09/27/2015   Lab Results  Component Value Date   HEPBSAB REACTIVE (A) 12/06/2012   No results found for: RPR  CBC Lab Results  Component Value Date   WBC 8.5 01/17/2016   RBC 5.43 01/17/2016   HGB 15.8 01/17/2016   HCT 50.6 01/17/2016   PLT 88 (L) 01/17/2016   MCV 93.2 01/17/2016   MCH 29.1 01/17/2016   MCHC 31.2 01/17/2016   RDW 13.3 01/17/2016   LYMPHSABS 1.8 09/27/2015   MONOABS 0.6 09/27/2015   EOSABS 0.2 09/27/2015   BASOSABS 0.0 09/27/2015   BMET Lab Results  Component Value Date   NA 134 (L) 01/17/2016   K 4.2 01/17/2016   CL 98 (L) 01/17/2016   CO2 31 01/17/2016   GLUCOSE 134 (H) 01/17/2016   BUN <5 (L) 01/17/2016   CREATININE 0.75 01/17/2016   CALCIUM 9.3 01/17/2016   GFRNONAA >60 01/17/2016   GFRAA >60 01/17/2016     Assessment and Plan  hiv disease = will change him to genvoya and stop the darunavir. Darunavir will interact with budesonide. Will change his stribild for genvoya  Copd = continue on inhaler regimen per [name]  Health maintenance = will give him the flu shot  Perineal boil/furuncle = will give short course of doxycycline to take on full stomach  Return in 6 wk to check viral load to ensure  it is still has suppression

## 2016-06-24 LAB — COMPLETE METABOLIC PANEL WITH GFR
ALT: 17 U/L (ref 9–46)
AST: 13 U/L (ref 10–35)
Albumin: 4 g/dL (ref 3.6–5.1)
Alkaline Phosphatase: 54 U/L (ref 40–115)
BUN: 8 mg/dL (ref 7–25)
CALCIUM: 9.4 mg/dL (ref 8.6–10.3)
CHLORIDE: 98 mmol/L (ref 98–110)
CO2: 29 mmol/L (ref 20–31)
CREATININE: 0.92 mg/dL (ref 0.70–1.33)
GFR, Est African American: >89 mL/min (ref >=60)
GFR, Est Non African American: >89 mL/min (ref >=60)
Glucose, Bld: 309 mg/dL — ABNORMAL HIGH (ref 65–99)
Potassium: 4.7 mmol/L (ref 3.5–5.3)
Sodium: 135 mmol/L (ref 135–146)
TOTAL PROTEIN: 8.3 g/dL — AB (ref 6.1–8.1)
Total Bilirubin: 0.5 mg/dL (ref 0.2–1.2)

## 2016-06-24 LAB — T-HELPER CELL (CD4) - (RCID CLINIC ONLY)
CD4 % Helper T Cell: 12 % — ABNORMAL LOW (ref 33–55)
CD4 T CELL ABS: 310 /uL — AB (ref 400–2700)

## 2016-06-24 LAB — RPR

## 2016-06-26 LAB — HIV-1 RNA QUANT-NO REFLEX-BLD
HIV 1 RNA Quant: <20 copies/mL (ref <20)
HIV-1 RNA Quant, Log: <1.3 Log copies/mL (ref <1.30)

## 2016-07-12 ENCOUNTER — Other Ambulatory Visit: Payer: Self-pay | Admitting: Internal Medicine

## 2016-07-12 DIAGNOSIS — F4321 Adjustment disorder with depressed mood: Secondary | ICD-10-CM

## 2016-07-16 ENCOUNTER — Telehealth: Payer: Self-pay | Admitting: Internal Medicine

## 2016-07-16 NOTE — Telephone Encounter (Signed)
error 

## 2016-07-17 ENCOUNTER — Ambulatory Visit: Payer: Medicare Other | Attending: Internal Medicine | Admitting: Internal Medicine

## 2016-07-17 DIAGNOSIS — J9611 Chronic respiratory failure with hypoxia: Secondary | ICD-10-CM | POA: Insufficient documentation

## 2016-07-17 DIAGNOSIS — J9612 Chronic respiratory failure with hypercapnia: Secondary | ICD-10-CM

## 2016-07-17 DIAGNOSIS — B20 Human immunodeficiency virus [HIV] disease: Secondary | ICD-10-CM | POA: Insufficient documentation

## 2016-07-17 DIAGNOSIS — J449 Chronic obstructive pulmonary disease, unspecified: Secondary | ICD-10-CM | POA: Diagnosis not present

## 2016-07-17 DIAGNOSIS — N529 Male erectile dysfunction, unspecified: Secondary | ICD-10-CM | POA: Insufficient documentation

## 2016-07-17 DIAGNOSIS — R1013 Epigastric pain: Secondary | ICD-10-CM | POA: Diagnosis not present

## 2016-07-17 DIAGNOSIS — Z760 Encounter for issue of repeat prescription: Secondary | ICD-10-CM | POA: Insufficient documentation

## 2016-07-17 DIAGNOSIS — E118 Type 2 diabetes mellitus with unspecified complications: Secondary | ICD-10-CM

## 2016-07-17 DIAGNOSIS — I10 Essential (primary) hypertension: Secondary | ICD-10-CM | POA: Insufficient documentation

## 2016-07-17 DIAGNOSIS — M1711 Unilateral primary osteoarthritis, right knee: Secondary | ICD-10-CM | POA: Diagnosis not present

## 2016-07-17 DIAGNOSIS — Z9981 Dependence on supplemental oxygen: Secondary | ICD-10-CM | POA: Diagnosis not present

## 2016-07-17 DIAGNOSIS — Z79899 Other long term (current) drug therapy: Secondary | ICD-10-CM | POA: Insufficient documentation

## 2016-07-17 DIAGNOSIS — F17210 Nicotine dependence, cigarettes, uncomplicated: Secondary | ICD-10-CM | POA: Insufficient documentation

## 2016-07-17 DIAGNOSIS — G4733 Obstructive sleep apnea (adult) (pediatric): Secondary | ICD-10-CM | POA: Diagnosis not present

## 2016-07-17 DIAGNOSIS — Z6839 Body mass index (BMI) 39.0-39.9, adult: Secondary | ICD-10-CM | POA: Diagnosis not present

## 2016-07-17 DIAGNOSIS — E119 Type 2 diabetes mellitus without complications: Secondary | ICD-10-CM | POA: Diagnosis not present

## 2016-07-17 DIAGNOSIS — Z794 Long term (current) use of insulin: Secondary | ICD-10-CM

## 2016-07-17 LAB — POCT GLYCOSYLATED HEMOGLOBIN (HGB A1C): Hemoglobin A1C: 9.4

## 2016-07-17 LAB — GLUCOSE, POCT (MANUAL RESULT ENTRY): POC Glucose: 293 mg/dl — AB (ref 70–99)

## 2016-07-17 MED ORDER — BUDESONIDE-FORMOTEROL FUMARATE 160-4.5 MCG/ACT IN AERO: INHALATION_SPRAY | RESPIRATORY_TRACT | 12 refills | Status: DC

## 2016-07-17 MED ORDER — ACETAMINOPHEN-CODEINE #3 300-30 MG PO TABS: 1.0000 | ORAL_TABLET | Freq: Three times a day (TID) | ORAL | 0 refills | Status: DC | PRN

## 2016-07-17 MED ORDER — TIOTROPIUM BROMIDE MONOHYDRATE 2.5 MCG/ACT IN AERS: INHALATION_SPRAY | RESPIRATORY_TRACT | 3 refills | Status: DC

## 2016-07-17 NOTE — Patient Instructions (Signed)
Blood Glucose Monitoring, Adult Monitoring your blood sugar (glucose) helps you manage your diabetes. It also helps you and your health care provider determine how well your diabetes management plan is working. Blood glucose monitoring involves checking your blood glucose as often as directed, and keeping a record (log) of your results over time. Why should I monitor my blood glucose? Checking your blood glucose regularly can:  Help you understand how food, exercise, illnesses, and medicines affect your blood glucose.  Let you know what your blood glucose is at any time. You can quickly tell if you are having low blood glucose (hypoglycemia) or high blood glucose (hyperglycemia).  Help you and your health care provider adjust your medicines as needed. When should I check my blood glucose? Follow instructions from your health care provider about how often to check your blood glucose. This may depend on:  The type of diabetes you have.  How well-controlled your diabetes is.  Medicines you are taking. If you have type 1 diabetes:  Check your blood glucose at least 2 times a day.  Also check your blood glucose:  Before every insulin injection.  Before and after exercise.  Between meals.  2 hours after a meal.  Occasionally between 2:00 a.m. and 3:00 a.m., as directed.  Before potentially dangerous tasks, like driving or using heavy machinery.  At bedtime.  You may need to check your blood glucose more often, up to 6-10 times a day:  If you use an insulin pump.  If you need multiple daily injections (MDI).  If your diabetes is not well-controlled.  If you are ill.  If you have a history of severe hypoglycemia.  If you have a history of not knowing when your blood glucose is getting low (hypoglycemia unawareness). If you have type 2 diabetes:  If you take insulin or other diabetes medicines, check your blood glucose at least 2 times a day.  If you are on intensive  insulin therapy, check your blood glucose at least 4 times a day. Occasionally, you may also need to check between 2:00 a.m. and 3:00 a.m., as directed.  Also check your blood glucose:  Before and after exercise.  Before potentially dangerous tasks, like driving or using heavy machinery.  You may need to check your blood glucose more often if:  Your medicine is being adjusted.  Your diabetes is not well-controlled.  You are ill. What is a blood glucose log?  A blood glucose log is a record of your blood glucose readings. It helps you and your health care provider:  Look for patterns in your blood glucose over time.  Adjust your diabetes management plan as needed.  Every time you check your blood glucose, write down your result and notes about things that may be affecting your blood glucose, such as your diet and exercise for the day.  Most glucose meters store a record of glucose readings in the meter. Some meters allow you to download your records to a computer. How do I check my blood glucose? Follow these steps to get accurate readings of your blood glucose: Supplies needed   Blood glucose meter.  Test strips for your meter. Each meter has its own strips. You must use the strips that come with your meter.  A needle to prick your finger (lancet). Do not use lancets more than once.  A device that holds the lancet (lancing device).  A journal or log book to write down your results. Procedure  Wash your  hands with soap and water.  Prick the side of your finger (not the tip) with the lancet. Use a different finger each time.  Gently rub the finger until a small drop of blood appears.  Follow instructions that come with your meter for inserting the test strip, applying blood to the strip, and using your blood glucose meter.  Write down your result and any notes. Alternative testing sites  Some meters allow you to use areas of your body other than your finger  (alternative sites) to test your blood.  If you think you may have hypoglycemia, or if you have hypoglycemia unawareness, do not use alternative sites. Use your finger instead.  Alternative sites may not be as accurate as the fingers, because blood flow is slower in these areas. This means that the result you get may be delayed, and it may be different from the result that you would get from your finger.  The most common alternative sites are:  Forearm.  Thigh.  Palm of the hand. Additional tips  Always keep your supplies with you.  If you have questions or need help, all blood glucose meters have a 24-hour "hotline" number that you can call. You may also contact your health care provider.  After you use a few boxes of test strips, adjust (calibrate) your blood glucose meter by following instructions that came with your meter. This information is not intended to replace advice given to you by your health care provider. Make sure you discuss any questions you have with your health care provider. Document Released: 07/31/2003 Document Revised: 02/15/2016 Document Reviewed: 01/07/2016 Elsevier Interactive Patient Education  2017 Elsevier Inc. Chronic Obstructive Pulmonary Disease Chronic obstructive pulmonary disease (COPD) is a common lung condition in which airflow from the lungs is limited. COPD is a general term that can be used to describe many different lung problems that limit airflow, including both chronic bronchitis and emphysema. If you have COPD, your lung function will probably never return to normal, but there are measures you can take to improve lung function and make yourself feel better. What are the causes?  Smoking (common).  Exposure to secondhand smoke.  Genetic problems.  Chronic inflammatory lung diseases or recurrent infections. What are the signs or symptoms?  Shortness of breath, especially with physical activity.  Deep, persistent (chronic) cough with a  large amount of thick mucus.  Wheezing.  Rapid breaths (tachypnea).  Gray or bluish discoloration (cyanosis) of the skin, especially in your fingers, toes, or lips.  Fatigue.  Weight loss.  Frequent infections or episodes when breathing symptoms become much worse (exacerbations).  Chest tightness. How is this diagnosed? Your health care provider will take a medical history and perform a physical examination to diagnose COPD. Additional tests for COPD may include:  Lung (pulmonary) function tests.  Chest X-ray.  CT scan.  Blood tests. How is this treated? Treatment for COPD may include:  Inhaler and nebulizer medicines. These help manage the symptoms of COPD and make your breathing more comfortable.  Supplemental oxygen. Supplemental oxygen is only helpful if you have a low oxygen level in your blood.  Exercise and physical activity. These are beneficial for nearly all people with COPD.  Lung surgery or transplant.  Nutrition therapy to gain weight, if you are underweight.  Pulmonary rehabilitation. This may involve working with a team of health care providers and specialists, such as respiratory, occupational, and physical therapists. Follow these instructions at home:  Take all medicines (inhaled or  pills) as directed by your health care provider.  Avoid over-the-counter medicines or cough syrups that dry up your airway (such as antihistamines) and slow down the elimination of secretions unless instructed otherwise by your health care provider.  If you are a smoker, the most important thing that you can do is stop smoking. Continuing to smoke will cause further lung damage and breathing trouble. Ask your health care provider for help with quitting smoking. He or she can direct you to community resources or hospitals that provide support.  Avoid exposure to irritants such as smoke, chemicals, and fumes that aggravate your breathing.  Use oxygen therapy and pulmonary  rehabilitation if directed by your health care provider. If you require home oxygen therapy, ask your health care provider whether you should purchase a pulse oximeter to measure your oxygen level at home.  Avoid contact with individuals who have a contagious illness.  Avoid extreme temperature and humidity changes.  Eat healthy foods. Eating smaller, more frequent meals and resting before meals may help you maintain your strength.  Stay active, but balance activity with periods of rest. Exercise and physical activity will help you maintain your ability to do things you want to do.  Preventing infection and hospitalization is very important when you have COPD. Make sure to receive all the vaccines your health care provider recommends, especially the pneumococcal and influenza vaccines. Ask your health care provider whether you need a pneumonia vaccine.  Learn and use relaxation techniques to manage stress.  Learn and use controlled breathing techniques as directed by your health care provider. Controlled breathing techniques include: 1. Pursed lip breathing. Start by breathing in (inhaling) through your nose for 1 second. Then, purse your lips as if you were going to whistle and breathe out (exhale) through the pursed lips for 2 seconds. 2. Diaphragmatic breathing. Start by putting one hand on your abdomen just above your waist. Inhale slowly through your nose. The hand on your abdomen should move out. Then purse your lips and exhale slowly. You should be able to feel the hand on your abdomen moving in as you exhale.  Learn and use controlled coughing to clear mucus from your lungs. Controlled coughing is a series of short, progressive coughs. The steps of controlled coughing are: 1. Lean your head slightly forward. 2. Breathe in deeply using diaphragmatic breathing. 3. Try to hold your breath for 3 seconds. 4. Keep your mouth slightly open while coughing twice. 5. Spit any mucus out into a  tissue. 6. Rest and repeat the steps once or twice as needed. Contact a health care provider if:  You are coughing up more mucus than usual.  There is a change in the color or thickness of your mucus.  Your breathing is more labored than usual.  Your breathing is faster than usual. Get help right away if:  You have shortness of breath while you are resting.  You have shortness of breath that prevents you from:  Being able to talk.  Performing your usual physical activities.  You have chest pain lasting longer than 5 minutes.  Your skin color is more cyanotic than usual.  You measure low oxygen saturations for longer than 5 minutes with a pulse oximeter. This information is not intended to replace advice given to you by your health care provider. Make sure you discuss any questions you have with your health care provider. Document Released: 05/07/2005 Document Revised: 01/03/2016 Document Reviewed: 03/24/2013 Elsevier Interactive Patient Education  2017 Elsevier  Inc. Steps to Quit Smoking Smoking tobacco can be bad for your health. It can also affect almost every organ in your body. Smoking puts you and people around you at risk for many serious long-lasting (chronic) diseases. Quitting smoking is hard, but it is one of the best things that you can do for your health. It is never too late to quit. What are the benefits of quitting smoking? When you quit smoking, you lower your risk for getting serious diseases and conditions. They can include:  Lung cancer or lung disease.  Heart disease.  Stroke.  Heart attack.  Not being able to have children (infertility).  Weak bones (osteoporosis) and broken bones (fractures). If you have coughing, wheezing, and shortness of breath, those symptoms may get better when you quit. You may also get sick less often. If you are pregnant, quitting smoking can help to lower your chances of having a baby of low birth weight. What can I do to  help me quit smoking? Talk with your doctor about what can help you quit smoking. Some things you can do (strategies) include:  Quitting smoking totally, instead of slowly cutting back how much you smoke over a period of time.  Going to in-person counseling. You are more likely to quit if you go to many counseling sessions.  Using resources and support systems, such as:  Online chats with a Veterinary surgeoncounselor.  Phone quitlines.  Printed Materials engineerself-help materials.  Support groups or group counseling.  Text messaging programs.  Mobile phone apps or applications.  Taking medicines. Some of these medicines may have nicotine in them. If you are pregnant or breastfeeding, do not take any medicines to quit smoking unless your doctor says it is okay. Talk with your doctor about counseling or other things that can help you. Talk with your doctor about using more than one strategy at the same time, such as taking medicines while you are also going to in-person counseling. This can help make quitting easier. What things can I do to make it easier to quit? Quitting smoking might feel very hard at first, but there is a lot that you can do to make it easier. Take these steps:  Talk to your family and friends. Ask them to support and encourage you.  Call phone quitlines, reach out to support groups, or work with a Veterinary surgeoncounselor.  Ask people who smoke to not smoke around you.  Avoid places that make you want (trigger) to smoke, such as:  Bars.  Parties.  Smoke-break areas at work.  Spend time with people who do not smoke.  Lower the stress in your life. Stress can make you want to smoke. Try these things to help your stress:  Getting regular exercise.  Deep-breathing exercises.  Yoga.  Meditating.  Doing a body scan. To do this, close your eyes, focus on one area of your body at a time from head to toe, and notice which parts of your body are tense. Try to relax the muscles in those  areas.  Download or buy apps on your mobile phone or tablet that can help you stick to your quit plan. There are many free apps, such as QuitGuide from the [employer] developer(Centers for Disease Control and Prevention). You can find more support from smokefree.gov and other websites. This information is not intended to replace advice given to you by your health care provider. Make sure you discuss any questions you have with your health care provider. Document Released: 05/24/2009 Document Revised:  03/25/2016 Document Reviewed: 12/12/2014 Elsevier Interactive Patient Education  2017 [employer].

## 2016-07-17 NOTE — Progress Notes (Signed)
Patient is here for FU  Patient has taken medication today. Patient has eaten today.   

## 2016-07-17 NOTE — Progress Notes (Signed)
Chris [name], is a 50 y.o. male  YHC:[id number]  DVV:[id number]  DOB - [birth year]  Subjective:   Chris [name] a 50 y.o. male  with multiple medical history including COPD on home oxygen, obstructive sleep apnea, type 2 diabetes mellitus, HIV infection, erectile dysfunction, morbid obesity and ongoing nicotine abuse here today for a follow up visit and medication refill. Patient is complaining of epigastric pain with burning sensation which has been ongoing for years. He has been treated for candida esophagitis before but no sustained relief. Patient says that this burning feeling occurs all day long and is sometimes worse after eating certain foods. This will sometimes even awaken him from sleep and is associated with a lot of abdominal bloating. Associated symptoms include a feeling of food getting stuck on its way down at times. This is intermittent but may occur 3-4 times a month. Patient denies fever, chills, blood in his stool, melena, change in bowel habits, weight loss, fatigue, anorexia, nausea, vomiting or reflux. Patient continues to smoke cigarette although at a reduced rate when compared to previous usage. He drinks a lot of soda on daily basis.  Problem  Abdominal Pain, Epigastric    ALLERGIES: Allergies  Allergen Reactions  . Bactrim [Sulfamethoxazole-Trimethoprim] Hives    PAST MEDICAL HISTORY: Past Medical History:  Diagnosis Date  . COPD (chronic obstructive pulmonary disease) ([place])   . Diabetes mellitus without complication ([place])   . Emphysema   . HIV disease ([place])     MEDICATIONS AT HOME: Prior to Admission medications   Medication Sig Start Date End Date Taking? Authorizing Provider  ACCU-CHEK SOFTCLIX LANCETS lancets Use as instructed 01/03/16   Tresa Garter, MD  acetaminophen-codeine (TYLENOL #3) 300-30 MG tablet Take 1 tablet by mouth every 8 (eight) hours as needed for moderate pain. 07/17/16   Tresa Garter, MD  albuterol (PROVENTIL  HFA;VENTOLIN HFA) 108 (90 Base) MCG/ACT inhaler Inhale 2 puffs into the lungs every 4 (four) hours as needed for wheezing or shortness of breath (((PLAN B))). 01/03/16   Tresa Garter, MD  Blood Glucose Monitoring Suppl (ACCU-CHEK AVIVA PLUS) W/DEVICE KIT 250 Test up to 4 times daily 04/28/14   Tresa Garter, MD  budesonide-formoterol (SYMBICORT) 160-4.5 MCG/ACT inhaler Take 2 puffs first thing in am and then another 2 puffs about 12 hours later. 07/17/16   Tresa Garter, MD  cetirizine (ZYRTEC) 10 MG tablet Take 1 tablet (10 mg total) by mouth daily. 02/14/16   Arnoldo Morale, MD  doxycycline (VIBRA-TABS) 100 MG tablet Take 1 tablet (100 mg total) by mouth 2 (two) times daily. On full stomach 06/23/16   Carlyle Basques, MD  DULoxetine (CYMBALTA) 20 MG capsule TAKE ONE CAPSULE BY MOUTH DAILY 07/14/16   Tresa Garter, MD  elvitegravir-cobicistat-emtricitabine-tenofovir (GENVOYA) 150-150-200-10 MG TABS tablet Take 1 tablet by mouth daily with breakfast. 06/23/16   Carlyle Basques, MD  glucose blood test strip Use as instructed 04/20/14   Tresa Garter, MD  insulin glargine (LANTUS) 100 UNIT/ML injection Inject 0.2 mLs (20 Units total) into the skin at bedtime. 09/04/15   Arnoldo Morale, MD  Insulin Pen Needle 32G X 5 MM MISC Use as directed 06/12/14   Tresa Garter, MD  Insulin Syringe-Needle U-100 (BD INSULIN SYRINGE ULTRAFINE) 31G X 15/64" 0.5 ML MISC 1 each by Does not apply route at bedtime. 01/24/16   Arnoldo Morale, MD  ipratropium-albuterol (DUONEB) 0.5-2.5 (3) MG/3ML SOLN Take 3 mLs by nebulization every 6 (six) hours  as needed. 01/24/16   Arnoldo Morale, MD  losartan (COZAAR) 25 MG tablet TAKE 1 TABLET BY MOUTH DAILY 06/09/16   Tresa Garter, MD  metFORMIN (GLUCOPHAGE) 500 MG tablet Take 2 tablets (1,000 mg total) by mouth 2 (two) times daily with a meal. 01/24/16   Arnoldo Morale, MD  metoCLOPramide (REGLAN) 5 MG tablet Take 1 tablet (5 mg total) by mouth 3 (three) times  daily before meals. 12/27/15   Carlyle Basques, MD  OXYGEN 2lpm 24/7 [facility]    Historical Provider, MD  sildenafil (VIAGRA) 50 MG tablet Take 0.5 tablets (25 mg total) by mouth daily as needed for erectile dysfunction. 06/18/15   Carlyle Basques, MD  Tiotropium Bromide Monohydrate (SPIRIVA RESPIMAT) 2.5 MCG/ACT AERS INHALE 2 PUFFS BY MOUTH EVERY MORNING 07/17/16   Tresa Garter, MD    Objective:  There were no vitals filed for this visit. Exam General appearance : Awake, alert, not in any distress. Speech Clear. Not toxic looking, obese HEENT: Atraumatic and Normocephalic, pupils equally reactive to light and accomodation Neck: Supple, no JVD. No cervical lymphadenopathy.  Chest: Good air entry bilaterally, no added sounds  CVS: S1 S2 regular, no murmurs.  Abdomen: Bowel sounds present, Non tender and not distended with no gaurding, rigidity or rebound. Extremities: B/L Lower Ext shows no edema, both legs are warm to touch Neurology: Awake alert, and oriented X 3, CN II-XII intact, Non focal Skin: No Rash  Data Review Lab Results  Component Value Date   HGBA1C 7.8 03/27/2016   HGBA1C 8.5 (H) 01/03/2016   HGBA1C 7.50 08/21/2015    Assessment & Plan   1. Type 2 diabetes mellitus with complication, with long-term current use of insulin (HCC)  - Glucose (CBG) - POCT A1C Aim for 30 minutes of exercise most days. Rethink what you drink. Water is great! Aim for 2-3 Carb Choices per meal (30-45 grams) +/- 1 either way  Aim for 0-15 Carbs per snack if hungry  Include protein in moderation with your meals and snacks  Consider reading food labels for Total Carbohydrate and Fat Grams of foods  Consider checking BG at alternate times per day  Continue taking medication as directed Be mindful about how much sugar you are adding to beverages and other foods. Fruit Punch - find one with no sugar  Measure and decrease portions of carbohydrate foods  Make your plate and don't go back for  seconds  2. Essential hypertension  We have discussed target BP range and blood pressure goal. I have advised patient to check BP regularly and to call us back or report to clinic if the numbers are consistently higher than 140/90. We discussed the importance of compliance with medical therapy and DASH diet recommended, consequences of uncontrolled hypertension discussed.  - continue current BP medications  3. HIV disease ([place])  - Patient recently started on a new HIV med, viral load remains undetected, CD4 count 320 - Continue current medications  4. Chronic respiratory failure with hypoxia and hypercapnia (HCC) - Continue Home Oxygen as needed - Follow up with pulmonologist  5. Primary osteoarthritis of right knee  - acetaminophen-codeine (TYLENOL #3) 300-30 MG tablet; Take 1 tablet by mouth every 8 (eight) hours as needed for moderate pain.  Dispense: 90 tablet; Refill: 0  6. COPD  GOLD III, still smoking  - budesonide-formoterol (SYMBICORT) 160-4.5 MCG/ACT inhaler; Take 2 puffs first thing in am and then another 2 puffs about 12 hours later.  Dispense: 1 Inhaler; Refill:  12 - Tiotropium Bromide Monohydrate (SPIRIVA RESPIMAT) 2.5 MCG/ACT AERS; INHALE 2 PUFFS BY MOUTH EVERY MORNING  Dispense: 4 g; Refill: 3  Juelz was counseled on the dangers of tobacco use, and was advised to quit. Reviewed strategies to maximize success, including removing cigarettes and smoking materials from environment, stress management and support of family/friends.  7. Abdominal pain, epigastric  - Ambulatory referral to Gastroenterology  Patient have been counseled extensively about nutrition and exercise. Other issues discussed during this visit include: low cholesterol diet, weight control and daily exercise, foot care, annual eye examinations at Ophthalmology, importance of adherence with medications and regular follow-up. We also discussed long term complications of uncontrolled diabetes and  hypertension.   Return in about 3 months (around 10/15/2016) for COPD, Follow up Pain and comorbidities, Hemoglobin A1C and Follow up, DM.  The patient was given clear instructions to go to ER or return to medical center if symptoms don't improve, worsen or new problems develop. The patient verbalized understanding. The patient was told to call to get lab results if they haven't heard anything in the next week.   This note has been created with Surveyor, quantity. Any transcriptional errors are unintentional.    Angelica Chessman, MD, [place], Karilyn Cota, [place] and [place], [place]   07/17/2016, 4:51 PM

## 2016-07-18 ENCOUNTER — Encounter: Payer: Self-pay | Admitting: Physician Assistant

## 2016-07-25 ENCOUNTER — Encounter: Payer: Self-pay | Admitting: Physician Assistant

## 2016-07-25 ENCOUNTER — Other Ambulatory Visit: Payer: Self-pay | Admitting: Emergency Medicine

## 2016-07-25 ENCOUNTER — Telehealth: Payer: Self-pay

## 2016-07-25 ENCOUNTER — Ambulatory Visit (INDEPENDENT_AMBULATORY_CARE_PROVIDER_SITE_OTHER): Payer: Medicare Other | Admitting: Physician Assistant

## 2016-07-25 VITALS — BP 126/62 | HR 92 | Ht 67.25 in | Wt 273.0 lb

## 2016-07-25 DIAGNOSIS — R12 Heartburn: Secondary | ICD-10-CM

## 2016-07-25 DIAGNOSIS — R1013 Epigastric pain: Secondary | ICD-10-CM

## 2016-07-25 DIAGNOSIS — R1314 Dysphagia, pharyngoesophageal phase: Secondary | ICD-10-CM

## 2016-07-25 DIAGNOSIS — R1310 Dysphagia, unspecified: Secondary | ICD-10-CM

## 2016-07-25 DIAGNOSIS — Z1211 Encounter for screening for malignant neoplasm of colon: Secondary | ICD-10-CM | POA: Diagnosis not present

## 2016-07-25 DIAGNOSIS — Z1212 Encounter for screening for malignant neoplasm of rectum: Secondary | ICD-10-CM

## 2016-07-25 MED ORDER — PANTOPRAZOLE SODIUM 40 MG PO TBEC: 40.0000 mg | DELAYED_RELEASE_TABLET | Freq: Two times a day (BID) | ORAL | 3 refills | Status: DC

## 2016-07-25 MED ORDER — NA SULFATE-K SULFATE-MG SULF 17.5-3.13-1.6 GM/177ML PO SOLN: 1.0000 | ORAL | 0 refills | Status: AC
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
## 2016-07-25 NOTE — Telephone Encounter (Signed)
-----   Message from Unk LightningJennifer Lynne Lemmon, [place]PA sent at 07/25/2016  2:50 PM EST ----- Regarding: Did you get this? Dr. Marina GoodellPerry wants pt to have barium swallow with tab and us before procedure. Thanks-JLL

## 2016-07-25 NOTE — Telephone Encounter (Signed)
Scheduled patient for barium swallow with tablet and abdominal ultrasound on 12/29 at [place]. Both procedures were explained to patient. Patient instructed to be fasting after midnight. Patient verbalized understanding.

## 2016-07-25 NOTE — Progress Notes (Signed)
Assessment and plans reviewed. I recommend placing the patient on a PPI and obtaining a barium swallow with tablet, an abdominal ultrasound to rule out stones prior to his procedures scheduled in February. The patient is high-risk secondary to his lung disease and body habitus. I will Route this back to Chris [name].

## 2016-07-25 NOTE — Patient Instructions (Signed)
You have been scheduled for an endoscopy and colonoscopy. Please follow the written instructions given to you at your visit today. Please pick up your prep supplies at the pharmacy within the next 1-3 days. If you use inhalers (even only as needed), please bring them with you on the day of your procedure. Your physician has requested that you go to www.startemmi.com and enter the access code given to you at your visit today. This web site gives a general overview about your procedure. However, you should still follow specific instructions given to you by our office regarding your preparation for the procedure.  We have sent the following medications to your pharmacy for you to pick up at your convenience: Pantoprazole 40 mg twice a day  We have given you an antireflux handout.

## 2016-07-29 ENCOUNTER — Encounter: Payer: Self-pay | Admitting: Podiatry

## 2016-07-29 ENCOUNTER — Ambulatory Visit (INDEPENDENT_AMBULATORY_CARE_PROVIDER_SITE_OTHER): Payer: Medicare Other | Admitting: Podiatry

## 2016-07-29 VITALS — BP 160/93 | HR 105 | Resp 16

## 2016-07-29 DIAGNOSIS — B351 Tinea unguium: Secondary | ICD-10-CM | POA: Diagnosis not present

## 2016-07-29 DIAGNOSIS — M79676 Pain in unspecified toe(s): Secondary | ICD-10-CM

## 2016-07-29 NOTE — Progress Notes (Signed)
Patient ID: Chris RiversJerome A Weckwerth Jr., male   DOB: 04/28/1966, 50 y.o.   MRN: [id number]     Subjective: This patient presents for scheduled visit complaining of uncomfortable and elongated toenails and walking wearing shoes and requests toenail debridement   Objective Orientated 3  Vascular: DP pulses 2/4 bilaterally PT pulses 2/4 bilaterally Capillary reflex immediate bilaterally  Neurological: Sensation to 10 g monofilament wire intact 5/5 right and 1/5 left Vibratory sensation equal and reactive bilaterally Ankle reflex equal and reactive bilaterally  Dermatological: Extremely dry plantar skin noted bilaterally The toenails are extremely elongated, hypertrophic, discolored, incurvated, and tender to palpation 6-10  Musculoskeletal: No deformities noted bilaterally There is no obvious restriction ankle, subtalar, midtarsal joints bilaterally   Assessment: Satisfactory vascular status Loss of protective sensation left Symptomatic onychomycoses 6-10 Type II diabetic Xerosis  HIV positive  Plan: Debridement toenails 10 without a bleeding Patient was advised to apply all-purpose skin lotion twice a day and at night also consider the use of Saran wrap with his sock to further reduce the excessive dryness in the skin Also, suggested the use a pumice stone or callus file after showering to reduce some of the scaling from dry skin on his plantar feet bilaterally  Reappoint 3 months

## 2016-07-29 NOTE — Patient Instructions (Signed)

## 2016-08-07 ENCOUNTER — Emergency Department (HOSPITAL_COMMUNITY): Payer: Medicare Other

## 2016-08-07 ENCOUNTER — Encounter (HOSPITAL_COMMUNITY): Payer: Self-pay | Admitting: *Deleted

## 2016-08-07 ENCOUNTER — Emergency Department (HOSPITAL_COMMUNITY)
Admission: EM | Admit: 2016-08-07 | Discharge: 2016-08-07 | Disposition: A | Discharge status: Home / Self Care | Payer: Medicare Other | Attending: Emergency Medicine | Admitting: Emergency Medicine

## 2016-08-07 DIAGNOSIS — R0602 Shortness of breath: Secondary | ICD-10-CM | POA: Diagnosis not present

## 2016-08-07 DIAGNOSIS — R05 Cough: Secondary | ICD-10-CM | POA: Diagnosis not present

## 2016-08-07 DIAGNOSIS — Z5321 Procedure and treatment not carried out due to patient leaving prior to being seen by health care provider: Secondary | ICD-10-CM | POA: Diagnosis not present

## 2016-08-07 LAB — COMPREHENSIVE METABOLIC PANEL
ALK PHOS: 51 U/L (ref 38–126)
ALT: 27 U/L (ref 17–63)
AST: 17 U/L (ref 15–41)
Albumin: 3.3 g/dL — ABNORMAL LOW (ref 3.5–5.0)
Anion gap: 10 (ref 5–15)
BILIRUBIN TOTAL: 0.5 mg/dL (ref 0.3–1.2)
BUN: 9 mg/dL (ref 6–20)
CALCIUM: 8.8 mg/dL — AB (ref 8.9–10.3)
CO2: 27 mmol/L (ref 22–32)
Chloride: 96 mmol/L — ABNORMAL LOW (ref 101–111)
Creatinine, Ser: 0.73 mg/dL (ref 0.61–1.24)
GFR calc Af Amer: >60 mL/min (ref >60)
Glucose, Bld: 426 mg/dL — ABNORMAL HIGH (ref 65–99)
POTASSIUM: 3.9 mmol/L (ref 3.5–5.1)
Sodium: 133 mmol/L — ABNORMAL LOW (ref 135–145)
TOTAL PROTEIN: 8.2 g/dL — AB (ref 6.5–8.1)

## 2016-08-07 LAB — CBC WITH DIFFERENTIAL/PLATELET
BASOS ABS: 0 10*3/uL (ref 0.0–0.1)
BASOS PCT: 0 %
EOS ABS: 0.1 10*3/uL (ref 0.0–0.7)
EOS PCT: 2 %
HCT: 48.8 % (ref 39.0–52.0)
Hemoglobin: 15.7 g/dL (ref 13.0–17.0)
LYMPHS PCT: 32 %
Lymphs Abs: 2.2 10*3/uL (ref 0.7–4.0)
MCH: 29.4 pg (ref 26.0–34.0)
MCHC: 32.2 g/dL (ref 30.0–36.0)
MCV: 91.4 fL (ref 78.0–100.0)
Monocytes Absolute: 0.7 10*3/uL (ref 0.1–1.0)
Monocytes Relative: 10 %
Neutro Abs: 3.7 10*3/uL (ref 1.7–7.7)
Neutrophils Relative %: 56 %
PLATELETS: 180 10*3/uL (ref 150–400)
RBC: 5.34 MIL/uL (ref 4.22–5.81)
RDW: 12.2 % (ref 11.5–15.5)
WBC: 6.7 10*3/uL (ref 4.0–10.5)

## 2016-08-07 LAB — I-STAT CG4 LACTIC ACID, ED: Lactic Acid, Venous: 1.38 mmol/L (ref 0.5–1.9)

## 2016-08-07 LAB — I-STAT TROPONIN, ED: TROPONIN I, POC: 0 ng/mL (ref 0.00–0.08)

## 2016-08-07 NOTE — ED Notes (Signed)
Pt states does not want to wait any longer. Seen leaving ED with steady gait 

## 2016-08-07 NOTE — ED Triage Notes (Signed)
Pt reports cough/sob for 3 day. Pt reports yellow thick sputum. Pt reports generalized weakness

## 2016-08-08 ENCOUNTER — Other Ambulatory Visit (HOSPITAL_COMMUNITY): Payer: Medicare Other

## 2016-08-08 ENCOUNTER — Emergency Department (HOSPITAL_COMMUNITY): Payer: Medicare Other

## 2016-08-08 ENCOUNTER — Encounter (HOSPITAL_COMMUNITY): Payer: Self-pay | Admitting: Emergency Medicine

## 2016-08-08 ENCOUNTER — Emergency Department (HOSPITAL_COMMUNITY)
Admission: EM | Admit: 2016-08-08 | Discharge: 2016-08-08 | Disposition: A | Discharge status: Home / Self Care | Payer: Medicare Other | Attending: Emergency Medicine | Admitting: Emergency Medicine

## 2016-08-08 ENCOUNTER — Ambulatory Visit (HOSPITAL_COMMUNITY): Payer: Medicare Other

## 2016-08-08 DIAGNOSIS — E119 Type 2 diabetes mellitus without complications: Secondary | ICD-10-CM | POA: Insufficient documentation

## 2016-08-08 DIAGNOSIS — R0902 Hypoxemia: Secondary | ICD-10-CM | POA: Insufficient documentation

## 2016-08-08 DIAGNOSIS — J441 Chronic obstructive pulmonary disease with (acute) exacerbation: Secondary | ICD-10-CM | POA: Diagnosis not present

## 2016-08-08 DIAGNOSIS — R05 Cough: Secondary | ICD-10-CM | POA: Diagnosis not present

## 2016-08-08 DIAGNOSIS — R0602 Shortness of breath: Secondary | ICD-10-CM | POA: Diagnosis not present

## 2016-08-08 DIAGNOSIS — Z794 Long term (current) use of insulin: Secondary | ICD-10-CM | POA: Diagnosis not present

## 2016-08-08 DIAGNOSIS — F17210 Nicotine dependence, cigarettes, uncomplicated: Secondary | ICD-10-CM | POA: Diagnosis not present

## 2016-08-08 MED ORDER — PREDNISONE 10 MG PO TABS: ORAL_TABLET | ORAL | 0 refills | Status: DC

## 2016-08-08 MED ORDER — PREDNISONE 20 MG PO TABS
60.0000 mg | ORAL_TABLET | Freq: Once | ORAL | Status: AC
  Administered 2016-08-08: 60 mg via ORAL
  Filled 2016-08-08: qty 3

## 2016-08-08 MED ORDER — DOXYCYCLINE HYCLATE 100 MG PO TABS
100.0000 mg | ORAL_TABLET | Freq: Once | ORAL | Status: AC
  Administered 2016-08-08: 100 mg via ORAL
  Filled 2016-08-08: qty 1

## 2016-08-08 MED ORDER — ALBUTEROL (5 MG/ML) CONTINUOUS INHALATION SOLN
10.0000 mg/h | INHALATION_SOLUTION | Freq: Once | RESPIRATORY_TRACT | Status: AC
  Administered 2016-08-08: 10 mg/h via RESPIRATORY_TRACT
  Filled 2016-08-08: qty 20

## 2016-08-08 MED ORDER — DOXYCYCLINE HYCLATE 100 MG PO CAPS: 100.0000 mg | ORAL_CAPSULE | Freq: Two times a day (BID) | ORAL | 0 refills | Status: DC

## 2016-08-08 MED ORDER — ALBUTEROL SULFATE (2.5 MG/3ML) 0.083% IN NEBU
INHALATION_SOLUTION | RESPIRATORY_TRACT | Status: AC
  Administered 2016-08-08: 07:00:00
  Filled 2016-08-08: qty 6

## 2016-08-08 MED ORDER — ALBUTEROL SULFATE (2.5 MG/3ML) 0.083% IN NEBU
5.0000 mg | INHALATION_SOLUTION | Freq: Once | RESPIRATORY_TRACT | Status: AC
  Administered 2016-08-08: 5 mg via RESPIRATORY_TRACT

## 2016-08-08 NOTE — ED Notes (Signed)
Patient transported to X-ray 

## 2016-08-08 NOTE — ED Notes (Signed)
Pt is in stable condition upon d/c and ambulates from ED. 

## 2016-08-08 NOTE — ED Notes (Signed)
Continuous nebulizer stopped at patient's request due to eye and nose irritation.  MD aware.

## 2016-08-08 NOTE — ED Triage Notes (Signed)
Pt. reports persistent productive cough with chest congestion / nasal congestion and post nasal drip onset this week . Denies fever or chills .

## 2016-08-08 NOTE — ED Notes (Signed)
Pt on 3L at home. RN turned O2 up to 5L and pt sats up to 90%
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
## 2016-08-08 NOTE — Discharge Instructions (Signed)
Use your home medications as usual.  Start the prescriptions which were sent to your pharmacy, today.  Return here if needed, for problems

## 2016-08-15 ENCOUNTER — Ambulatory Visit (HOSPITAL_COMMUNITY)
Admit: 2016-08-15 | Discharge: 2016-08-15 | Disposition: A | Discharge status: Home / Self Care | Payer: Medicare Other | Attending: Physician Assistant | Admitting: Physician Assistant

## 2016-08-15 ENCOUNTER — Other Ambulatory Visit: Payer: Self-pay | Admitting: Emergency Medicine

## 2016-08-15 ENCOUNTER — Other Ambulatory Visit: Payer: Self-pay | Admitting: *Deleted

## 2016-08-15 ENCOUNTER — Telehealth: Payer: Self-pay | Admitting: Internal Medicine

## 2016-08-15 ENCOUNTER — Telehealth: Payer: Self-pay | Admitting: Emergency Medicine

## 2016-08-15 DIAGNOSIS — K760 Fatty (change of) liver, not elsewhere classified: Secondary | ICD-10-CM | POA: Insufficient documentation

## 2016-08-15 DIAGNOSIS — R1310 Dysphagia, unspecified: Secondary | ICD-10-CM

## 2016-08-15 DIAGNOSIS — R12 Heartburn: Secondary | ICD-10-CM | POA: Diagnosis not present

## 2016-08-15 DIAGNOSIS — R1013 Epigastric pain: Secondary | ICD-10-CM

## 2016-08-15 DIAGNOSIS — Z1211 Encounter for screening for malignant neoplasm of colon: Secondary | ICD-10-CM

## 2016-08-15 DIAGNOSIS — M1711 Unilateral primary osteoarthritis, right knee: Secondary | ICD-10-CM

## 2016-08-15 MED ORDER — ACETAMINOPHEN-CODEINE #3 300-30 MG PO TABS: 1.0000 | ORAL_TABLET | Freq: Three times a day (TID) | ORAL | 0 refills | Status: DC | PRN

## 2016-08-15 NOTE — Telephone Encounter (Signed)
-----   Message from Irene Shipper, MD sent at 07/25/2016  2:18 PM EST ----- You can use my next hospital week on a Tuesday, Wednesday, Thursday. Needs MAC. Thanks ----- Message ----- From: Wyline Beady, CMA Sent: 07/25/2016   1:47 PM To: Irene Shipper, MD  Hi Dr. Henrene Pastor,   [facility] saw this patient this morning and he needs a double procedure at the hospital. I was looking at your Tuesday block on 2/13 but when I called they said that there won't be any MAC that day due to ERCP. Will you find a date that is suitable and get back to me please?  Thanks,   [employer]

## 2016-08-15 NOTE — Telephone Encounter (Signed)
Pt. Came into facility requesting a refill on Tylenol # 3.  °Please f/u  °

## 2016-08-15 NOTE — Telephone Encounter (Signed)
Patient has been scheduled 09-03-16. He verbalized understanding. He was instructed in office but I will send him updated instructions in the mail. He states he already has his suprep.

## 2016-08-15 NOTE — Progress Notes (Signed)
Patient scheduled for RGD/colon on 09-03-16. Per Dr. Marina GoodellPerry he could be scheduled on his hospital week. Spoke to patient and he verbalized understanding. He was instructed in office but I will send him updated instructions with the time and new date.

## 2016-08-15 NOTE — Telephone Encounter (Signed)
Patient received his Tylenol 3 which was due for 08/17/16.

## 2016-08-21 ENCOUNTER — Other Ambulatory Visit: Payer: Self-pay | Admitting: Internal Medicine

## 2016-08-26 ENCOUNTER — Encounter: Payer: Self-pay | Admitting: Internal Medicine

## 2016-08-26 ENCOUNTER — Ambulatory Visit (INDEPENDENT_AMBULATORY_CARE_PROVIDER_SITE_OTHER): Payer: Medicare Other | Admitting: Internal Medicine

## 2016-08-26 VITALS — BP 127/86 | HR 94 | Temp 98.3°F | Ht 69.0 in | Wt 256.0 lb

## 2016-08-26 DIAGNOSIS — B372 Candidiasis of skin and nail: Secondary | ICD-10-CM | POA: Diagnosis not present

## 2016-08-26 DIAGNOSIS — E118 Type 2 diabetes mellitus with unspecified complications: Secondary | ICD-10-CM | POA: Diagnosis not present

## 2016-08-26 DIAGNOSIS — Z794 Long term (current) use of insulin: Secondary | ICD-10-CM

## 2016-08-26 DIAGNOSIS — B20 Human immunodeficiency virus [HIV] disease: Secondary | ICD-10-CM

## 2016-08-26 DIAGNOSIS — Z21 Asymptomatic human immunodeficiency virus [HIV] infection status: Secondary | ICD-10-CM | POA: Diagnosis present

## 2016-08-26 DIAGNOSIS — J441 Chronic obstructive pulmonary disease with (acute) exacerbation: Secondary | ICD-10-CM | POA: Diagnosis not present

## 2016-08-26 DIAGNOSIS — E1165 Type 2 diabetes mellitus with hyperglycemia: Secondary | ICD-10-CM

## 2016-08-26 DIAGNOSIS — R6882 Decreased libido: Secondary | ICD-10-CM | POA: Diagnosis not present

## 2016-08-26 DIAGNOSIS — E119 Type 2 diabetes mellitus without complications: Secondary | ICD-10-CM | POA: Diagnosis not present

## 2016-08-26 MED ORDER — ACCU-CHEK SOFTCLIX LANCETS MISC: 12 refills | Status: DC

## 2016-08-26 MED ORDER — "INSULIN SYRINGE-NEEDLE U-100 31G X 15/64"" 0.5 ML MISC": 1.0000 | Freq: Every day | 5 refills | Status: DC

## 2016-08-26 MED ORDER — EZ SMART BLOOD GLUCOSE LANCETS MISC: 1.0000 [IU] | Freq: Four times a day (QID) | 11 refills | Status: DC

## 2016-08-26 MED ORDER — CLOTRIMAZOLE 1 % EX CREA: 1.0000 "application " | TOPICAL_CREAM | Freq: Two times a day (BID) | CUTANEOUS | 0 refills | Status: DC

## 2016-08-26 NOTE — Progress Notes (Signed)
RFV: follow up for HIV disease  Patient ID: Chris [name]., male   DOB: Sep 12, 1965, 51 y.o.   MRN: [id number]  HPI Chris [name] a 51yo M with well controlled hiv disease, but also multiple comorbidites, cd 4 count of 310/VL<20 on   Had copd exacerbation on 12/29 ginve a course of doxy plus steroids. He reports since having steroids he has had some candidal skin infection of groin  He has been out of his insulin lantus for 4 days since running out of syringes. This coincides with frequent urination. No dysuria. Drinks sodas frequently  Outpatient Encounter Prescriptions as of 08/26/2016  Medication Sig  . ACCU-CHEK SOFTCLIX LANCETS lancets Use as instructed  . acetaminophen-codeine (TYLENOL #3) 300-30 MG tablet Take 1 tablet by mouth every 8 (eight) hours as needed for moderate pain.  [name] albuterol (PROVENTIL HFA;VENTOLIN HFA) 108 (90 Base) MCG/ACT inhaler Inhale 2 puffs into the lungs every 4 (four) hours as needed for wheezing or shortness of breath (((PLAN B))).  . Blood Glucose Monitoring Suppl (ACCU-CHEK AVIVA PLUS) W/DEVICE KIT 250 Test up to 4 times daily  . budesonide-formoterol (SYMBICORT) 160-4.5 MCG/ACT inhaler Take 2 puffs first thing in am and then another 2 puffs about 12 hours later.  . doxycycline (VIBRAMYCIN) 100 MG capsule Take 1 capsule (100 mg total) by mouth 2 (two) times daily. One po bid x 7 days  . elvitegravir-cobicistat-emtricitabine-tenofovir (GENVOYA) 150-150-200-10 MG TABS tablet Take 1 tablet by mouth daily with breakfast.  . glucose blood test strip Use as instructed  . insulin glargine (LANTUS) 100 UNIT/ML injection Inject 0.2 mLs (20 Units total) into the skin at bedtime.  . Insulin Pen Needle 32G X 5 MM MISC Use as directed  . Insulin Syringe-Needle U-100 (BD INSULIN SYRINGE ULTRAFINE) 31G X 15/64" 0.5 ML MISC 1 each by Does not apply route at bedtime.  [name] ipratropium-albuterol (DUONEB) 0.5-2.5 (3) MG/3ML SOLN Take 3 mLs by nebulization every 6 (six) hours  as needed.  . metFORMIN (GLUCOPHAGE) 500 MG tablet Take 2 tablets (1,000 mg total) by mouth 2 (two) times daily with a meal.  . OXYGEN Place 2 L/min into the nose as needed.   . pantoprazole (PROTONIX) 40 MG tablet Take 1 tablet (40 mg total) by mouth 2 (two) times daily before a meal.  . predniSONE (DELTASONE) 10 MG tablet Take q day 6,5,4,3,2,1  . sildenafil (VIAGRA) 50 MG tablet Take 0.5 tablets (25 mg total) by mouth daily as needed for erectile dysfunction.  . Tiotropium Bromide Monohydrate (SPIRIVA RESPIMAT) 2.5 MCG/ACT AERS INHALE 2 PUFFS BY MOUTH EVERY MORNING   No facility-administered encounter medications on file as of 08/26/2016.      Patient Active Problem List   Diagnosis Date Noted  . Abdominal pain, epigastric 07/17/2016  . Candidiasis of mouth 03/27/2016  . Adjustment disorder with depressed mood 01/04/2016  . Callus of foot 01/04/2016  . Hoarseness of voice 10/04/2015  . AIDS (acquired immunodeficiency syndrome) ([place]) 10/04/2015  . Type 2 diabetes mellitus with complication, with long-term current use of insulin ([place]) 10/04/2015  . Cigarette nicotine dependence without complication 56/21/3086  . Morbid (severe) obesity due to excess calories ([place]) 09/04/2015  . Primary osteoarthritis of right knee 03/26/2015  . Genital warts 01/22/2015  . Other male erectile dysfunction 01/22/2015  . Type 2 diabetes mellitus with complication ([place]) 57/84/6962  . Essential hypertension, benign 12/04/2014  . Atopic eczema 12/04/2014  . Cyst (solitary) of breast 08/23/2013  . Breast abscess 08/23/2013  .  OSA (obstructive sleep apnea) 08/23/2013  . Cigarette smoker 02/17/2013  . Hyperglycemia 02/17/2013  . Uncontrolled diabetes mellitus ([place]) 02/17/2013  . DM (diabetes mellitus) ([place]) 01/27/2013  . Steroid-induced hyperglycemia 01/12/2013  . Chronic respiratory failure with hypoxia and hypercapnia ([place]) 01/12/2013  . COPD  GOLD III, still smoking 01/11/2013  . Nicotine abuse  01/11/2013  . HIV disease ([place]) 12/06/2012     Health Maintenance Due  Topic Date Due  . FOOT EXAM  04/12/1976  . OPHTHALMOLOGY EXAM  04/12/1976  . TETANUS/TDAP  04/12/1985  . COLONOSCOPY  04/12/2016     Review of Systems Per hpi, also complains of "jock itch" otherwise 10 point ros is negative Physical Exam  BP 127/86   Pulse 94   Temp 98.3 F (36.8 C) (Oral)   Ht '5\' 9"'  (1.753 m)   Wt 256 lb (116.1 kg)   BMI 37.80 kg/m  Physical Exam  Constitutional: He is oriented to person, place, and time. He appears well-developed and well-nourished. No distress.  HENT:  Mouth/Throat: Oropharynx is clear and moist. No oropharyngeal exudate.  Cardiovascular: Normal rate, regular rhythm and normal heart sounds. Exam reveals no gallop and no friction rub.  No murmur heard.  Pulmonary/Chest: Effort normal and breath sounds normal. No respiratory distress. He has no wheezes.  Lymphadenopathy:  He has no cervical adenopathy.  Skin: Skin is warm and dry. No rash noted. No erythema.  Psychiatric: He has a normal mood and affect. His behavior is normal.     Lab Results  Component Value Date   CD4TCELL 12 (L) 06/23/2016   Lab Results  Component Value Date   CD4TABS 310 (L) 06/23/2016   CD4TABS 230 (L) 09/27/2015   CD4TABS 90 (L) 08/14/2015   Lab Results  Component Value Date   HIV1RNAQUANT <20 06/23/2016   Lab Results  Component Value Date   HEPBSAB REACTIVE (A) 12/06/2012   Lab Results  Component Value Date   LABRPR NON REAC 06/23/2016    CBC Lab Results  Component Value Date   WBC 6.7 08/07/2016   RBC 5.34 08/07/2016   HGB 15.7 08/07/2016   HCT 48.8 08/07/2016   PLT 180 08/07/2016   MCV 91.4 08/07/2016   MCH 29.4 08/07/2016   MCHC 32.2 08/07/2016   RDW 12.2 08/07/2016   LYMPHSABS 2.2 08/07/2016   MONOABS 0.7 08/07/2016   EOSABS 0.1 08/07/2016    BMET Lab Results  Component Value Date   NA 133 (L) 08/07/2016   K 3.9 08/07/2016   CL 96 (L) 08/07/2016    CO2 27 08/07/2016   GLUCOSE 426 (H) 08/07/2016   BUN 9 08/07/2016   CREATININE 0.73 08/07/2016   CALCIUM 8.8 (L) 08/07/2016   GFRNONAA >60 08/07/2016   GFRAA >60 08/07/2016      Assessment and Plan  Dm, poorly controlled = needs much diet modification. On blood work from 12/28 visit his Glucose was 426. Refer back to community well ness as well as needs endocrinologist with diabetic educator. Will give rx for syringes and lancets. Needs PCP visit  Candidal skin infection = can use topical lotrim cream to help with symptoms  hiv disease = wellcontrolled, continue on current regimen  Health maintenance = will give meningococcal

## 2016-09-01 ENCOUNTER — Encounter (HOSPITAL_COMMUNITY): Payer: Self-pay

## 2016-09-03 ENCOUNTER — Telehealth: Payer: Self-pay

## 2016-09-03 ENCOUNTER — Encounter (HOSPITAL_COMMUNITY)
Admission: RE | Disposition: A | Discharge status: Home / Self Care | Payer: Self-pay | Source: Ambulatory Visit | Attending: Internal Medicine

## 2016-09-03 ENCOUNTER — Other Ambulatory Visit: Payer: Self-pay

## 2016-09-03 ENCOUNTER — Ambulatory Visit (HOSPITAL_COMMUNITY): Payer: Medicare Other | Admitting: Certified Registered Nurse Anesthetist

## 2016-09-03 ENCOUNTER — Encounter (HOSPITAL_COMMUNITY): Payer: Self-pay | Admitting: Internal Medicine

## 2016-09-03 ENCOUNTER — Ambulatory Visit (HOSPITAL_COMMUNITY)
Admission: RE | Admit: 2016-09-03 | Discharge: 2016-09-03 | Disposition: A | Discharge status: Home / Self Care | Payer: Medicare Other | Source: Ambulatory Visit | Attending: Internal Medicine | Admitting: Internal Medicine

## 2016-09-03 DIAGNOSIS — R12 Heartburn: Secondary | ICD-10-CM

## 2016-09-03 DIAGNOSIS — F329 Major depressive disorder, single episode, unspecified: Secondary | ICD-10-CM | POA: Insufficient documentation

## 2016-09-03 DIAGNOSIS — Z1212 Encounter for screening for malignant neoplasm of rectum: Secondary | ICD-10-CM | POA: Diagnosis not present

## 2016-09-03 DIAGNOSIS — B3781 Candidal esophagitis: Secondary | ICD-10-CM | POA: Diagnosis not present

## 2016-09-03 DIAGNOSIS — Z800 Family history of malignant neoplasm of digestive organs: Secondary | ICD-10-CM | POA: Insufficient documentation

## 2016-09-03 DIAGNOSIS — Z833 Family history of diabetes mellitus: Secondary | ICD-10-CM | POA: Diagnosis not present

## 2016-09-03 DIAGNOSIS — Z8249 Family history of ischemic heart disease and other diseases of the circulatory system: Secondary | ICD-10-CM | POA: Insufficient documentation

## 2016-09-03 DIAGNOSIS — J449 Chronic obstructive pulmonary disease, unspecified: Secondary | ICD-10-CM | POA: Diagnosis not present

## 2016-09-03 DIAGNOSIS — B20 Human immunodeficiency virus [HIV] disease: Secondary | ICD-10-CM | POA: Insufficient documentation

## 2016-09-03 DIAGNOSIS — Z1211 Encounter for screening for malignant neoplasm of colon: Secondary | ICD-10-CM

## 2016-09-03 DIAGNOSIS — Z825 Family history of asthma and other chronic lower respiratory diseases: Secondary | ICD-10-CM | POA: Diagnosis not present

## 2016-09-03 DIAGNOSIS — R1013 Epigastric pain: Secondary | ICD-10-CM

## 2016-09-03 DIAGNOSIS — F17210 Nicotine dependence, cigarettes, uncomplicated: Secondary | ICD-10-CM | POA: Insufficient documentation

## 2016-09-03 DIAGNOSIS — K210 Gastro-esophageal reflux disease with esophagitis: Secondary | ICD-10-CM | POA: Diagnosis not present

## 2016-09-03 DIAGNOSIS — R1310 Dysphagia, unspecified: Secondary | ICD-10-CM

## 2016-09-03 DIAGNOSIS — Z882 Allergy status to sulfonamides status: Secondary | ICD-10-CM | POA: Insufficient documentation

## 2016-09-03 DIAGNOSIS — Z803 Family history of malignant neoplasm of breast: Secondary | ICD-10-CM | POA: Diagnosis not present

## 2016-09-03 DIAGNOSIS — K219 Gastro-esophageal reflux disease without esophagitis: Secondary | ICD-10-CM | POA: Diagnosis present

## 2016-09-03 HISTORY — DX: Gastro-esophageal reflux disease without esophagitis: K21.9

## 2016-09-03 HISTORY — PX: ESOPHAGOGASTRODUODENOSCOPY (EGD) WITH PROPOFOL: SHX5813

## 2016-09-03 HISTORY — PX: COLONOSCOPY WITH PROPOFOL: SHX5780

## 2016-09-03 LAB — GLUCOSE, CAPILLARY: GLUCOSE-CAPILLARY: 258 mg/dL — AB (ref 65–99)

## 2016-09-03 SURGERY — ESOPHAGOGASTRODUODENOSCOPY (EGD) WITH PROPOFOL
Anesthesia: Monitor Anesthesia Care

## 2016-09-03 MED ORDER — PROPOFOL 10 MG/ML IV BOLUS
INTRAVENOUS | Status: AC
  Filled 2016-09-03: qty 60

## 2016-09-03 MED ORDER — LIDOCAINE 2% (20 MG/ML) 5 ML SYRINGE
INTRAMUSCULAR | Status: DC | PRN
  Administered 2016-09-03: 100 mg via INTRAVENOUS

## 2016-09-03 MED ORDER — ONDANSETRON HCL 4 MG/2ML IJ SOLN
INTRAMUSCULAR | Status: DC | PRN
  Administered 2016-09-03: 4 mg via INTRAVENOUS

## 2016-09-03 MED ORDER — ONDANSETRON HCL 4 MG/2ML IJ SOLN
INTRAMUSCULAR | Status: AC
  Filled 2016-09-03: qty 2

## 2016-09-03 MED ORDER — LACTATED RINGERS IV SOLN
INTRAVENOUS | Status: DC
  Administered 2016-09-03: 1000 mL via INTRAVENOUS

## 2016-09-03 MED ORDER — PROPOFOL 10 MG/ML IV BOLUS
INTRAVENOUS | Status: DC | PRN
  Administered 2016-09-03 (×3): 20 mg via INTRAVENOUS
  Administered 2016-09-03 (×2): 10 mg via INTRAVENOUS

## 2016-09-03 MED ORDER — SODIUM CHLORIDE 0.9 % IV SOLN: INTRAVENOUS | Status: DC

## 2016-09-03 MED ORDER — LIDOCAINE 2% (20 MG/ML) 5 ML SYRINGE
INTRAMUSCULAR | Status: AC
  Filled 2016-09-03: qty 5

## 2016-09-03 MED ORDER — PROPOFOL 500 MG/50ML IV EMUL
INTRAVENOUS | Status: DC | PRN
  Administered 2016-09-03: 75 ug/kg/min via INTRAVENOUS

## 2016-09-03 SURGICAL SUPPLY — 24 items

## 2016-09-03 NOTE — Anesthesia Postprocedure Evaluation (Addendum)
Anesthesia Post Note  Patient: Chris [name].  Procedure(s) Performed: Procedure(s) (LRB): ESOPHAGOGASTRODUODENOSCOPY (EGD) WITH PROPOFOL (N/A) COLONOSCOPY WITH PROPOFOL (N/A)  Patient location during evaluation: PACU Anesthesia Type: MAC Level of consciousness: awake and alert Pain management: pain level controlled Vital Signs Assessment: post-procedure vital signs reviewed and stable Respiratory status: spontaneous breathing, nonlabored ventilation, respiratory function stable and patient connected to nasal cannula oxygen Cardiovascular status: stable and blood pressure returned to baseline Anesthetic complications: no       Last Vitals:  Vitals:   09/03/16 1305 09/03/16 1310  BP: (!) 143/81 (!) 160/88  Pulse: 91 85  Resp: (!) 22 (!) 24  Temp: 36.4 C     Last Pain:  Vitals:   09/03/16 1305  TempSrc: Oral  PainSc:                  Asheley Hellberg S

## 2016-09-03 NOTE — Op Note (Signed)
[facility] Patient Name: Chris [name] Procedure Date: 09/03/2016 MRN: [id number] Attending MD: Wilhemina Bonito. Marina Goodell , MD Date of Birth: 27-Aug-1965 CSN: [id number] Age: 51 Admit Type: Outpatient Procedure:                Colonoscopy Indications:              Screening for colorectal malignant neoplasm Providers:                Wilhemina Bonito. Marina Goodell, MD Referring MD:             Hyman Hopes, MD Medicines:                Monitored Anesthesia Care Complications:            No immediate complications. Estimated blood loss:                            None. Estimated Blood Loss:     Estimated blood loss: none. Procedure:                Pre-Anesthesia Assessment:                           - Prior to the procedure, a History and Physical                            was performed, and patient medications and                            allergies were reviewed. The patient's tolerance of                            previous anesthesia was also reviewed. The risks                            and benefits of the procedure and the sedation                            options and risks were discussed with the patient.                            All questions were answered, and informed consent                            was obtained. Prior Anticoagulants: The patient has                            taken no previous anticoagulant or antiplatelet                            agents. ASA Grade Assessment: III - A patient with                            severe systemic disease. After reviewing the risks  and benefits, the patient was deemed in                            satisfactory condition to undergo the procedure.                           After obtaining informed consent, the colonoscope                            was passed under direct vision. Throughout the                            procedure, the patient's blood pressure, pulse, and                            oxygen saturations  were monitored continuously. The                            EC-[id number] ([id number] scope was introduced through                            the anus and advanced to the the cecum, identified                            by appendiceal orifice and ileocecal valve. The                            ileocecal valve, appendiceal orifice, and rectum                            were photographed. The quality of the bowel                            preparation was excellent. The colonoscopy was                            performed without difficulty. The patient tolerated                            the procedure well. The bowel preparation used was                            SUPREP. Scope In: 12:21:26 PM Scope Out: 12:38:18 PM Scope Withdrawal Time: 0 hours 10 minutes 47 seconds  Total Procedure Duration: 0 hours 16 minutes 52 seconds  Findings:      Internal hemorrhoids were found during retroflexion. The hemorrhoids       were small.      The entire examined colon appeared normal on direct and retroflexion       views. Impression:               - The entire examined colon is normal on direct and                            retroflexion views.                           -  No specimens collected. Moderate Sedation:      none Recommendation:           - Repeat colonoscopy in 10 years for screening                            purposes.                           - Patient has a contact number available for                            emergencies. The signs and symptoms of potential                            delayed complications were discussed with the                            patient. Return to normal activities tomorrow.                            Written discharge instructions were provided to the                            patient.                           - Resume previous diet.                           - Continue present medications. Procedure Code(s):        --- Professional ---                            [phone], Colonoscopy, flexible; diagnostic, including                            collection of specimen(s) by brushing or washing,                            when performed (separate procedure) Diagnosis Code(s):        --- Professional ---                           Z12.11, Encounter for screening for malignant                            neoplasm of colon CPT copyright 2016 American Medical Association. All rights reserved. The codes documented in this report are preliminary and upon coder review may  be revised to meet current compliance requirements. Wilhemina Bonito. Marina Goodell, MD 09/03/2016 12:44:01 PM This report has been signed electronically. Number of Addenda: 0

## 2016-09-03 NOTE — Telephone Encounter (Signed)
[facility]

## 2016-09-03 NOTE — Transfer of Care (Signed)
Immediate Anesthesia Transfer of Care Note  Patient: Chris [name].  Procedure(s) Performed: Procedure(s): ESOPHAGOGASTRODUODENOSCOPY (EGD) WITH PROPOFOL (N/A) COLONOSCOPY WITH PROPOFOL (N/A)  Patient Location: PACU  Anesthesia Type:MAC  Level of Consciousness:  sedated, patient cooperative and responds to stimulation  Airway & Oxygen Therapy:Patient Spontanous Breathing and Patient connected to face mask oxgen  Post-op Assessment:  Report given to PACU RN and Post -op Vital signs reviewed and stable  Post vital signs:  Reviewed and stable  Last Vitals:  Vitals:   09/03/16 1120  BP: 121/85  Resp: 20  Temp: 00.1 C    Complications: No apparent anesthesia complications

## 2016-09-03 NOTE — Telephone Encounter (Signed)
-----   Message from Hilarie FredricksonJohn N Perry, MD sent at 09/03/2016 12:59 PM EST ----- Regarding: Diflucan prescription I just finished with this patient at the hospital. Outpatient. Has Candida esophagitis. Please call in Diflucan 200 mg on day 1 then 100 mg daily for one week; #8; no refills. Thanks

## 2016-09-03 NOTE — Discharge Instructions (Signed)
YOU HAD AN ENDOSCOPIC PROCEDURE TODAY: Refer to the procedure report and other information in the discharge instructions given to you for any specific questions about what was found during the examination. If this information does not answer your questions, please call [place] office at [phone] to clarify.  ° °YOU SHOULD EXPECT: Some feelings of bloating in the abdomen. Passage of more gas than usual. Walking can help get rid of the air that was put into your GI tract during the procedure and reduce the bloating. If you had a lower endoscopy (such as a colonoscopy or flexible sigmoidoscopy) you may notice spotting of blood in your stool or on the toilet paper. Some abdominal soreness may be present for a day or two, also. ° °DIET: Your first meal following the procedure should be a light meal and then it is ok to progress to your normal diet. A half-sandwich or bowl of soup is an example of a good first meal. Heavy or fried foods are harder to digest and may make you feel nauseous or bloated. Drink plenty of fluids but you should avoid alcoholic beverages for 24 hours. If you had a esophageal dilation, please see attached instructions for diet.   ° °ACTIVITY: Your care partner should take you home directly after the procedure. You should plan to take it easy, moving slowly for the rest of the day. You can resume normal activity the day after the procedure however YOU SHOULD NOT DRIVE, use power tools, machinery or perform tasks that involve climbing or major physical exertion for 24 hours (because of the sedation medicines used during the test).  ° °SYMPTOMS TO REPORT IMMEDIATELY: °A gastroenterologist can be reached at any hour. Please call [phone]  for any of the following symptoms:  °Following lower endoscopy (colonoscopy, flexible sigmoidoscopy) °Excessive amounts of blood in the stool  °Significant tenderness, worsening of abdominal pains  °Swelling of the abdomen that is new, acute  °Fever of 100° or  higher  °Following upper endoscopy (EGD, EUS, ERCP, esophageal dilation) °Vomiting of blood or coffee ground material  °New, significant abdominal pain  °New, significant chest pain or pain under the shoulder blades  °Painful or persistently difficult swallowing  °New shortness of breath  °Black, tarry-looking or red, bloody stools ° °FOLLOW UP:  °If any biopsies were taken you will be contacted by phone or by letter within the next 1-3 weeks. Call [phone]  if you have not heard about the biopsies in 3 weeks.  °Please also call with any specific questions about appointments or follow up tests. ° °

## 2016-09-03 NOTE — Op Note (Signed)
[facility] Patient Name: Chris [name] Procedure Date: 09/03/2016 MRN: [id number] Attending MD: Wilhemina Bonito. Marina Goodell , MD Date of Birth: [birth year] CSN: [id number] Age: 51 Admit Type: Outpatient Procedure:                Upper GI endoscopy, with biopsies Indications:              Heartburn, Epigastric abdominal pain, dysphagia,                            Gastro-esophageal reflux disease despite PPI Providers:                Wilhemina Bonito. Marina Goodell, MD, Omelia Blackwater RN, RN, Arlee Muslim Tech., Technician, Waymond Cera, CRNA Referring MD:             Quentin Angst MD, Medicines:                Monitored Anesthesia Care Complications:            No immediate complications. Estimated Blood Loss:     Estimated blood loss: none. Procedure:                Pre-Anesthesia Assessment:                           - Prior to the procedure, a History and Physical                            was performed, and patient medications and                            allergies were reviewed. The patient's tolerance of                            previous anesthesia was also reviewed. The risks                            and benefits of the procedure and the sedation                            options and risks were discussed with the patient.                            All questions were answered, and informed consent                            was obtained. Prior Anticoagulants: The patient has                            taken no previous anticoagulant or antiplatelet                            agents. ASA Grade Assessment: III - A patient with  severe systemic disease. After reviewing the risks                            and benefits, the patient was deemed in                            satisfactory condition to undergo the procedure.                           After obtaining informed consent, the endoscope was                            passed  under direct vision. Throughout the                            procedure, the patient's blood pressure, pulse, and                            oxygen saturations were monitored continuously. The                            [id number] ([id number]) scope was introduced through the                            mouth, and advanced to the second part of duodenum.                            The upper GI endoscopy was accomplished without                            difficulty. The patient tolerated the procedure                            well. Scope In: Scope Out: Findings:      Severe Candida esophagitis was found throughout the entire esophagus.       Biopsies were taken with a cold forceps for histology.      The esophagus was otherwise normal.      The stomach was normal.      The examined duodenum was normal.      The cardia and gastric fundus were normal on retroflexion. Impression:               - Severe candidiasis esophagitis. Biopsied.                           - Normal esophagus.                           - Normal stomach.                           - Normal examined duodenum. Moderate Sedation:      none Recommendation:           - Prescribe Diflucan 200 mg on day 1 then 100 mg  daily for 1 week until completed; #8; no refills.                           - Resume previous diet.                           - Continue present medications. Procedure Code(s):        --- Professional ---                           [phone], Esophagogastroduodenoscopy, flexible,                            transoral; with biopsy, single or multiple Diagnosis Code(s):        --- Professional ---                           B37.81, Candidal esophagitis                           R12, Heartburn                           R10.13, Epigastric pain                           K21.9, Gastro-esophageal reflux disease without                            esophagitis CPT copyright 2016 American Medical  Association. All rights reserved. The codes documented in this report are preliminary and upon coder review may  be revised to meet current compliance requirements. Wilhemina BonitoJohn N. Marina GoodellPerry, MD 09/03/2016 12:58:54 PM This report has been signed electronically. Number of Addenda: 0

## 2016-09-03 NOTE — Anesthesia Preprocedure Evaluation (Signed)
Anesthesia Evaluation  Patient identified by MRN, date of birth, ID band Patient awake    Reviewed: Allergy & Precautions, NPO status , Patient's Chart, lab work & pertinent test results  Airway Mallampati: II  TM Distance: >3 FB Neck ROM: Full    Dental no notable dental hx.    Pulmonary COPD, Current Smoker,    Pulmonary exam normal breath sounds clear to auscultation       Cardiovascular hypertension, Normal cardiovascular exam Rhythm:Regular Rate:Normal     Neuro/Psych negative neurological ROS  negative psych ROS   GI/Hepatic negative GI ROS, Neg liver ROS,   Endo/Other  diabetes  Renal/GU negative Renal ROS  negative genitourinary   Musculoskeletal negative musculoskeletal ROS (+)   Abdominal   Peds negative pediatric ROS (+)  Hematology  (+) HIV,   Anesthesia Other Findings   Reproductive/Obstetrics negative OB ROS                             Anesthesia Physical Anesthesia Plan  ASA: III  Anesthesia Plan: MAC   Post-op Pain Management:    Induction: Intravenous  Airway Management Planned: Nasal Cannula  Additional Equipment:   Intra-op Plan:   Post-operative Plan:   Informed Consent: I have reviewed the patients History and Physical, chart, labs and discussed the procedure including the risks, benefits and alternatives for the proposed anesthesia with the patient or authorized representative who has indicated his/her understanding and acceptance.   Dental advisory given  Plan Discussed with: CRNA and Surgeon  Anesthesia Plan Comments:         Anesthesia Quick Evaluation
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
## 2016-09-04 ENCOUNTER — Encounter (HOSPITAL_COMMUNITY): Payer: Self-pay | Admitting: Internal Medicine

## 2016-09-10 ENCOUNTER — Other Ambulatory Visit: Payer: Self-pay | Admitting: Pharmacist

## 2016-09-10 MED ORDER — UMECLIDINIUM BROMIDE 62.5 MCG/INH IN AEPB: 1.0000 | INHALATION_SPRAY | Freq: Every day | RESPIRATORY_TRACT | 3 refills | Status: DC

## 2016-09-11 ENCOUNTER — Other Ambulatory Visit: Payer: Self-pay | Admitting: *Deleted

## 2016-09-11 DIAGNOSIS — Z794 Long term (current) use of insulin: Principal | ICD-10-CM

## 2016-09-11 DIAGNOSIS — E11628 Type 2 diabetes mellitus with other skin complications: Secondary | ICD-10-CM

## 2016-09-11 MED ORDER — INSULIN GLARGINE 100 UNIT/ML ~~LOC~~ SOLN: 20.0000 [IU] | Freq: Every day | SUBCUTANEOUS | 1 refills | Status: DC

## 2016-09-17 ENCOUNTER — Ambulatory Visit: Payer: Medicare Other | Attending: Internal Medicine | Admitting: Internal Medicine

## 2016-09-17 ENCOUNTER — Encounter: Payer: Self-pay | Admitting: Internal Medicine

## 2016-09-17 VITALS — BP 108/67 | HR 96 | Temp 98.6°F | Resp 18 | Ht 69.0 in | Wt 255.0 lb

## 2016-09-17 DIAGNOSIS — B20 Human immunodeficiency virus [HIV] disease: Secondary | ICD-10-CM | POA: Diagnosis not present

## 2016-09-17 DIAGNOSIS — I10 Essential (primary) hypertension: Secondary | ICD-10-CM | POA: Diagnosis not present

## 2016-09-17 DIAGNOSIS — Z9981 Dependence on supplemental oxygen: Secondary | ICD-10-CM | POA: Diagnosis not present

## 2016-09-17 DIAGNOSIS — M1711 Unilateral primary osteoarthritis, right knee: Secondary | ICD-10-CM | POA: Diagnosis not present

## 2016-09-17 DIAGNOSIS — G4733 Obstructive sleep apnea (adult) (pediatric): Secondary | ICD-10-CM | POA: Diagnosis not present

## 2016-09-17 DIAGNOSIS — Z79899 Other long term (current) drug therapy: Secondary | ICD-10-CM | POA: Diagnosis not present

## 2016-09-17 DIAGNOSIS — K219 Gastro-esophageal reflux disease without esophagitis: Secondary | ICD-10-CM | POA: Insufficient documentation

## 2016-09-17 DIAGNOSIS — Z794 Long term (current) use of insulin: Secondary | ICD-10-CM | POA: Diagnosis not present

## 2016-09-17 DIAGNOSIS — E11628 Type 2 diabetes mellitus with other skin complications: Secondary | ICD-10-CM | POA: Diagnosis not present

## 2016-09-17 DIAGNOSIS — Z7951 Long term (current) use of inhaled steroids: Secondary | ICD-10-CM | POA: Insufficient documentation

## 2016-09-17 DIAGNOSIS — E119 Type 2 diabetes mellitus without complications: Secondary | ICD-10-CM | POA: Diagnosis present

## 2016-09-17 DIAGNOSIS — Z6837 Body mass index (BMI) 37.0-37.9, adult: Secondary | ICD-10-CM | POA: Diagnosis not present

## 2016-09-17 DIAGNOSIS — F329 Major depressive disorder, single episode, unspecified: Secondary | ICD-10-CM | POA: Diagnosis not present

## 2016-09-17 DIAGNOSIS — Z882 Allergy status to sulfonamides status: Secondary | ICD-10-CM | POA: Diagnosis not present

## 2016-09-17 DIAGNOSIS — J439 Emphysema, unspecified: Secondary | ICD-10-CM | POA: Insufficient documentation

## 2016-09-17 LAB — GLUCOSE, POCT (MANUAL RESULT ENTRY): POC Glucose: 357 mg/dl — AB (ref 70–99)

## 2016-09-17 MED ORDER — INSULIN GLARGINE 100 UNIT/ML ~~LOC~~ SOLN
20.0000 [IU] | Freq: Every day | SUBCUTANEOUS | 1 refills | Status: DC
Start: 2016-09-17 — End: 2017-02-18

## 2016-09-17 MED ORDER — METFORMIN HCL 1000 MG PO TABS: 1000.0000 mg | ORAL_TABLET | Freq: Two times a day (BID) | ORAL | 3 refills | Status: DC

## 2016-09-17 MED ORDER — ACETAMINOPHEN-CODEINE #3 300-30 MG PO TABS: 1.0000 | ORAL_TABLET | Freq: Three times a day (TID) | ORAL | 0 refills | Status: DC | PRN

## 2016-09-17 MED ORDER — "INSULIN SYRINGE-NEEDLE U-100 31G X 15/64"" 0.5 ML MISC": 1.0000 | Freq: Every day | 5 refills | Status: DC

## 2016-09-17 NOTE — Patient Instructions (Signed)
Diabetes and Foot Care Diabetes may cause you to have problems because of poor blood supply (circulation) to your feet and legs. This may cause the skin on your feet to become thinner, break easier, and heal more slowly. Your skin may become dry, and the skin may peel and crack. You may also have nerve damage in your legs and feet causing decreased feeling in them. You may not notice minor injuries to your feet that could lead to infections or more serious problems. Taking care of your feet is one of the most important things you can do for yourself. Follow these instructions at home:  Wear shoes at all times, even in the house. Do not go barefoot. Bare feet are easily injured.  Check your feet daily for blisters, cuts, and redness. If you cannot see the bottom of your feet, use a mirror or ask someone for help.  Wash your feet with warm water (do not use hot water) and mild soap. Then pat your feet and the areas between your toes until they are completely dry. Do not soak your feet as this can dry your skin.  Apply a moisturizing lotion or petroleum jelly (that does not contain alcohol and is unscented) to the skin on your feet and to dry, brittle toenails. Do not apply lotion between your toes.  Trim your toenails straight across. Do not dig under them or around the cuticle. File the edges of your nails with an emery board or nail file.  Do not cut corns or calluses or try to remove them with medicine.  Wear clean socks or stockings every day. Make sure they are not too tight. Do not wear knee-high stockings since they may decrease blood flow to your legs.  Wear shoes that fit properly and have enough cushioning. To break in new shoes, wear them for just a few hours a day. This prevents you from injuring your feet. Always look in your shoes before you put them on to be sure there are no objects inside.  Do not cross your legs. This may decrease the blood flow to your feet.  If you find a  minor scrape, cut, or break in the skin on your feet, keep it and the skin around it clean and dry. These areas may be cleansed with mild soap and water. Do not cleanse the area with peroxide, alcohol, or iodine.  When you remove an adhesive bandage, be sure not to damage the skin around it.  If you have a wound, look at it several times a day to make sure it is healing.  Do not use heating pads or hot water bottles. They may burn your skin. If you have lost feeling in your feet or legs, you may not know it is happening until it is too late.  Make sure your health care provider performs a complete foot exam at least annually or more often if you have foot problems. Report any cuts, sores, or bruises to your health care provider immediately. Contact a health care provider if:  You have an injury that is not healing.  You have cuts or breaks in the skin.  You have an ingrown nail.  You notice redness on your legs or feet.  You feel burning or tingling in your legs or feet.  You have pain or cramps in your legs and feet.  Your legs or feet are numb.  Your feet always feel cold. Get help right away if:  There is increasing   redness, swelling, or pain in or around a wound.  There is a red line that goes up your leg.  Pus is coming from a wound.  You develop a fever or as directed by your health care provider.  You notice a bad smell coming from an ulcer or wound. This information is not intended to replace advice given to you by your health care provider. Make sure you discuss any questions you have with your health care provider. Document Released: 07/25/2000 Document Revised: 01/03/2016 Document Reviewed: 01/04/2013 Elsevier Interactive Patient Education  2017 [employer] Inc[employer] Hypertension Hypertension, commonly called high blood pressure, is when the force of blood pumping through your arteries is too strong. Your arteries are the blood vessels that carry blood from your heart  throughout your body. A blood pressure reading consists of a higher number over a lower number, such as 110/72. The higher number (systolic) is the pressure inside your arteries when your heart pumps. The lower number (diastolic) is the pressure inside your arteries when your heart relaxes. Ideally you want your blood pressure below 120/80. Hypertension forces your heart to work harder to pump blood. Your arteries may become narrow or stiff. Having untreated or uncontrolled hypertension can cause heart attack, stroke, kidney disease, and other problems. What increases the risk? Some risk factors for high blood pressure are controllable. Others are not. Risk factors you cannot control include:  Race. You may be at higher risk if you are African American.  Age. Risk increases with age.  Gender. Men are at higher risk than women before age 66 years. After age 73, women are at higher risk than men. Risk factors you can control include:  Not getting enough exercise or physical activity.  Being overweight.  Getting too much fat, sugar, calories, or salt in your diet.  Drinking too much alcohol. What are the signs or symptoms? Hypertension does not usually cause signs or symptoms. Extremely high blood pressure (hypertensive crisis) may cause headache, anxiety, shortness of breath, and nosebleed. How is this diagnosed? To check if you have hypertension, your health care provider will measure your blood pressure while you are seated, with your arm held at the level of your heart. It should be measured at least twice using the same arm. Certain conditions can cause a difference in blood pressure between your right and left arms. A blood pressure reading that is higher than normal on one occasion does not mean that you need treatment. If it is not clear whether you have high blood pressure, you may be asked to return on a different day to have your blood pressure checked again. Or, you may be asked to  monitor your blood pressure at home for 1 or more weeks. How is this treated? Treating high blood pressure includes making lifestyle changes and possibly taking medicine. Living a healthy lifestyle can help lower high blood pressure. You may need to change some of your habits. Lifestyle changes may include:  Following the DASH diet. This diet is high in fruits, vegetables, and whole grains. It is low in salt, red meat, and added sugars.  Keep your sodium intake below 2,300 mg per day.  Getting at least 30-45 minutes of aerobic exercise at least 4 times per week.  Losing weight if necessary.  Not smoking.  Limiting alcoholic beverages.  Learning ways to reduce stress. Your health care provider may prescribe medicine if lifestyle changes are not enough to get your blood pressure under control, and if one of  the following is true:  You are 3418-51 years of age and your systolic blood pressure is above 140.  You are 51 years of age or older, and your systolic blood pressure is above 150.  Your diastolic blood pressure is above 90.  You have diabetes, and your systolic blood pressure is over 140 or your diastolic blood pressure is over 90.  You have kidney disease and your blood pressure is above 140/90.  You have heart disease and your blood pressure is above 140/90. Your personal target blood pressure may vary depending on your medical conditions, your age, and other factors. Follow these instructions at home:  Have your blood pressure rechecked as directed by your health care provider.  Take medicines only as directed by your health care provider. Follow the directions carefully. Blood pressure medicines must be taken as prescribed. The medicine does not work as well when you skip doses. Skipping doses also puts you at risk for problems.  Do not smoke.  Monitor your blood pressure at home as directed by your health care provider. Contact a health care provider if:  You think  you are having a reaction to medicines taken.  You have recurrent headaches or feel dizzy.  You have swelling in your ankles.  You have trouble with your vision. Get help right away if:  You develop a severe headache or confusion.  You have unusual weakness, numbness, or feel faint.  You have severe chest or abdominal pain.  You vomit repeatedly.  You have trouble breathing. This information is not intended to replace advice given to you by your health care provider. Make sure you discuss any questions you have with your health care provider. Document Released: 07/28/2005 Document Revised: 01/03/2016 Document Reviewed: 05/20/2013 Elsevier Interactive Patient Education  2017 Elsevier Inc. Blood Glucose Monitoring, Adult Monitoring your blood sugar (glucose) helps you manage your diabetes. It also helps you and your health care provider determine how well your diabetes management plan is working. Blood glucose monitoring involves checking your blood glucose as often as directed, and keeping a record (log) of your results over time. Why should I monitor my blood glucose? Checking your blood glucose regularly can:  Help you understand how food, exercise, illnesses, and medicines affect your blood glucose.  Let you know what your blood glucose is at any time. You can quickly tell if you are having low blood glucose (hypoglycemia) or high blood glucose (hyperglycemia).  Help you and your health care provider adjust your medicines as needed. When should I check my blood glucose? Follow instructions from your health care provider about how often to check your blood glucose. This may depend on:  The type of diabetes you have.  How well-controlled your diabetes is.  Medicines you are taking. If you have type 1 diabetes:  Check your blood glucose at least 2 times a day.  Also check your blood glucose:  Before every insulin injection.  Before and after exercise.  Between  meals.  2 hours after a meal.  Occasionally between 2:00 a.m. and 3:00 a.m., as directed.  Before potentially dangerous tasks, like driving or using heavy machinery.  At bedtime.  You may need to check your blood glucose more often, up to 6-10 times a day:  If you use an insulin pump.  If you need multiple daily injections (MDI).  If your diabetes is not well-controlled.  If you are ill.  If you have a history of severe hypoglycemia.  If you have  a history of not knowing when your blood glucose is getting low (hypoglycemia unawareness). If you have type 2 diabetes:  If you take insulin or other diabetes medicines, check your blood glucose at least 2 times a day.  If you are on intensive insulin therapy, check your blood glucose at least 4 times a day. Occasionally, you may also need to check between 2:00 a.m. and 3:00 a.m., as directed.  Also check your blood glucose:  Before and after exercise.  Before potentially dangerous tasks, like driving or using heavy machinery.  You may need to check your blood glucose more often if:  Your medicine is being adjusted.  Your diabetes is not well-controlled.  You are ill. What is a blood glucose log?  A blood glucose log is a record of your blood glucose readings. It helps you and your health care provider:  Look for patterns in your blood glucose over time.  Adjust your diabetes management plan as needed.  Every time you check your blood glucose, write down your result and notes about things that may be affecting your blood glucose, such as your diet and exercise for the day.  Most glucose meters store a record of glucose readings in the meter. Some meters allow you to download your records to a computer. How do I check my blood glucose? Follow these steps to get accurate readings of your blood glucose: Supplies needed   Blood glucose meter.  Test strips for your meter. Each meter has its own strips. You must use the  strips that come with your meter.  A needle to prick your finger (lancet). Do not use lancets more than once.  A device that holds the lancet (lancing device).  A journal or log book to write down your results. Procedure  Wash your hands with soap and water.  Prick the side of your finger (not the tip) with the lancet. Use a different finger each time.  Gently rub the finger until a small drop of blood appears.  Follow instructions that come with your meter for inserting the test strip, applying blood to the strip, and using your blood glucose meter.  Write down your result and any notes. Alternative testing sites  Some meters allow you to use areas of your body other than your finger (alternative sites) to test your blood.  If you think you may have hypoglycemia, or if you have hypoglycemia unawareness, do not use alternative sites. Use your finger instead.  Alternative sites may not be as accurate as the fingers, because blood flow is slower in these areas. This means that the result you get may be delayed, and it may be different from the result that you would get from your finger.  The most common alternative sites are:  Forearm.  Thigh.  Palm of the hand. Additional tips  Always keep your supplies with you.  If you have questions or need help, all blood glucose meters have a 24-hour "hotline" number that you can call. You may also contact your health care provider.  After you use a few boxes of test strips, adjust (calibrate) your blood glucose meter by following instructions that came with your meter. This information is not intended to replace advice given to you by your health care provider. Make sure you discuss any questions you have with your health care provider. Document Released: 07/31/2003 Document Revised: 02/15/2016 Document Reviewed: 01/07/2016 Elsevier Interactive Patient Education  2017 [employer].

## 2016-09-17 NOTE — Progress Notes (Signed)
Chris [name], is a 51 y.o. male  WCH:[id number]  [id number]:[id number]  DOB - January 23, 1966  No chief complaint on file.      Subjective:   Chris [name] a 51 y.o. male with multiple medical history including COPD on home oxygen, obstructive sleep apnea, type 2 diabetes mellitus, HIV infection, erectile dysfunction, morbid obesity and ongoing nicotine abuseheretodayfor a follow up visitand medication refill. Patient has no significant complaint today except for ongoing general body pain. He is trying to lose weight deliberately to help with his co-morbidities. He adheres with his medications and home oxygen. His mood is better. He needs sleep study for possible obstructive sleep apnea. Patient has No headache, No chest pain, No abdominal pain - No Nausea, No new weakness tingling or numbness. No leg swelling.   Problem  Obstructive Sleep Apnea Syndrome    ALLERGIES: Allergies  Allergen Reactions  . Bactrim [Sulfamethoxazole-Trimethoprim] Hives    PAST MEDICAL HISTORY: Past Medical History:  Diagnosis Date  . COPD (chronic obstructive pulmonary disease) ([place])   . Depression   . Diabetes mellitus without complication ([place])   . Dyspnea   . Emphysema   . GERD (gastroesophageal reflux disease)   . HIV disease ([place])     MEDICATIONS AT HOME: Prior to Admission medications   Medication Sig Start Date End Date Taking? Authorizing Provider  ACCU-CHEK SOFTCLIX LANCETS lancets Use as instructed 08/26/16   Carlyle Basques, MD  acetaminophen-codeine (TYLENOL #3) 300-30 MG tablet Take 1 tablet by mouth every 8 (eight) hours as needed for moderate pain. 09/17/16   Tresa Garter, MD  albuterol (PROVENTIL HFA;VENTOLIN HFA) 108 (90 Base) MCG/ACT inhaler Inhale 2 puffs into the lungs every 4 (four) hours as needed for wheezing or shortness of breath (((PLAN B))). 01/03/16   Tresa Garter, MD  Blood Glucose Monitoring Suppl (ACCU-CHEK AVIVA PLUS) W/DEVICE KIT 250 Test up to 4 times daily  04/28/14   Tresa Garter, MD  budesonide-formoterol (SYMBICORT) 160-4.5 MCG/ACT inhaler Take 2 puffs first thing in am and then another 2 puffs about 12 hours later. 07/17/16   Tresa Garter, MD  clotrimazole (LOTRIMIN) 1 % cream Apply 1 application topically 2 (two) times daily. 08/26/16   Carlyle Basques, MD  elvitegravir-cobicistat-emtricitabine-tenofovir (GENVOYA) 150-150-200-10 MG TABS tablet Take 1 tablet by mouth daily with breakfast. 06/23/16   Carlyle Basques, MD  EZ SMART BLOOD GLUCOSE LANCETS MISC 1 Units by Does not apply route 4 (four) times daily. 08/26/16   Carlyle Basques, MD  glucose blood test strip Use as instructed 04/20/14   Tresa Garter, MD  insulin glargine (LANTUS) 100 UNIT/ML injection Inject 0.2 mLs (20 Units total) into the skin at bedtime. 09/17/16   Tresa Garter, MD  Insulin Pen Needle 32G X 5 MM MISC Use as directed 06/12/14   Tresa Garter, MD  Insulin Syringe-Needle U-100 (BD INSULIN SYRINGE ULTRAFINE) 31G X 15/64" 0.5 ML MISC 1 each by Does not apply route at bedtime. 09/17/16   Tresa Garter, MD  ipratropium-albuterol (DUONEB) 0.5-2.5 (3) MG/3ML SOLN Take 3 mLs by nebulization every 6 (six) hours as needed. Patient taking differently: Take 3 mLs by nebulization every 6 (six) hours as needed (shortness of breath).  01/24/16   Arnoldo Morale, MD  metFORMIN (GLUCOPHAGE) 1000 MG tablet Take 1 tablet (1,000 mg total) by mouth 2 (two) times daily with a meal. 09/17/16   Tresa Garter, MD  OXYGEN Place 2 L/min into the nose as needed.  Historical Provider, MD  pantoprazole (PROTONIX) 40 MG tablet Take 1 tablet (40 mg total) by mouth 2 (two) times daily before a meal. 07/25/16   Levin Erp, PA  predniSONE (DELTASONE) 10 MG tablet Take q day 6,5,4,3,2,1 Patient not taking: Reported on 08/26/2016 08/08/16   Daleen Bo, MD  sildenafil (VIAGRA) 50 MG tablet Take 0.5 tablets (25 mg total) by mouth daily as needed for erectile dysfunction.  06/18/15   Carlyle Basques, MD  Tiotropium Bromide Monohydrate (SPIRIVA RESPIMAT) 2.5 MCG/ACT AERS INHALE 2 PUFFS BY MOUTH EVERY MORNING 07/17/16   Tresa Garter, MD  umeclidinium bromide (INCRUSE ELLIPTA) 62.5 MCG/INH AEPB Inhale 1 puff into the lungs daily. 09/10/16   Tresa Garter, MD    Objective:   Vitals:   09/17/16 1131  BP: 108/67  Pulse: 96  Resp: 18  Temp: 98.6 F (37 C)  TempSrc: Oral  SpO2: 94%  Weight: 255 lb (115.7 kg)  Height: '5\' 9"'  (1.753 m)   Exam  General appearance : Awake, alert, not in any distress. Speech Clear. Not toxic looking, obese, on Oxygen HEENT: Atraumatic and Normocephalic, pupils equally reactive to light and accomodation Neck: Supple, no JVD. No cervical lymphadenopathy.  Chest: Good air entry bilaterally, no added sounds  CVS: S1 S2 regular, no murmurs.  Abdomen: Bowel sounds present, Non tender and not distended with no gaurding, rigidity or rebound. Extremities: B/L Lower Ext shows no edema, both legs are warm to touch Neurology: Awake alert, and oriented X 3, CN II-XII intact, Non focal Skin: No Rash  Data Review Lab Results  Component Value Date   HGBA1C 9.4 07/17/2016   HGBA1C 7.8 03/27/2016   HGBA1C 8.5 (H) 01/03/2016    Assessment & Plan   1. Type 2 diabetes mellitus with other skin complication, with long-term current use of insulin (HCC)  - Glucose (CBG) - Insulin Syringe-Needle U-100 (BD INSULIN SYRINGE ULTRAFINE) 31G X 15/64" 0.5 ML MISC; 1 each by Does not apply route at bedtime.  Dispense: 30 each; Refill: 5 - insulin glargine (LANTUS) 100 UNIT/ML injection; Inject 0.2 mLs (20 Units total) into the skin at bedtime.  Dispense: 10 mL; Refill: 1 Increase - metFORMIN (GLUCOPHAGE) 1000 MG tablet; Take 1 tablet (1,000 mg total) by mouth 2 (two) times daily with a meal.  Dispense: 180 tablet; Refill: 3  2. Primary osteoarthritis of right knee  - acetaminophen-codeine (TYLENOL #3) 300-30 MG tablet; Take 1 tablet by mouth  every 8 (eight) hours as needed for moderate pain.  Dispense: 90 tablet; Refill: 0  3. Obstructive sleep apnea syndrome  - Split night study; Future  4. HIV disease ([place])  Continue to follow up with ID specialist  5. Essential hypertension, benign  We have discussed target BP range and blood pressure goal. I have advised patient to check BP regularly and to call us back or report to clinic if the numbers are consistently higher than 140/90. We discussed the importance of compliance with medical therapy and DASH diet recommended, consequences of uncontrolled hypertension discussed.  - continue current BP medications  Patient have been counseled extensively about nutrition and exercise. Other issues discussed during this visit include: low cholesterol diet, weight control and daily exercise, foot care, annual eye examinations at Ophthalmology, importance of adherence with medications and regular follow-up. We also discussed long term complications of uncontrolled diabetes and hypertension.   Return in about 3 months (around 12/15/2016) for Follow up HTN, Hemoglobin A1C and Follow up, DM, Follow up  Pain and comorbidities.  The patient was given clear instructions to go to ER or return to medical center if symptoms don't improve, worsen or new problems develop. The patient verbalized understanding. The patient was told to call to get lab results if they haven't heard anything in the next week.   This note has been created with Surveyor, quantity. Any transcriptional errors are unintentional.    Angelica Chessman, MD, [place], Karilyn Cota, [place] and [facility] [place], [place]   09/17/2016, 11:53 AM

## 2016-09-18 ENCOUNTER — Ambulatory Visit: Payer: Medicare Other | Admitting: Internal Medicine

## 2016-09-25 ENCOUNTER — Encounter: Payer: Self-pay | Admitting: Internal Medicine

## 2016-09-25 ENCOUNTER — Ambulatory Visit (INDEPENDENT_AMBULATORY_CARE_PROVIDER_SITE_OTHER): Payer: Medicare Other | Admitting: Internal Medicine

## 2016-09-25 VITALS — BP 130/84 | HR 96 | Ht 69.0 in | Wt 258.0 lb

## 2016-09-25 DIAGNOSIS — J9612 Chronic respiratory failure with hypercapnia: Secondary | ICD-10-CM | POA: Diagnosis not present

## 2016-09-25 DIAGNOSIS — F17210 Nicotine dependence, cigarettes, uncomplicated: Secondary | ICD-10-CM

## 2016-09-25 DIAGNOSIS — J9611 Chronic respiratory failure with hypoxia: Secondary | ICD-10-CM

## 2016-09-25 DIAGNOSIS — J449 Chronic obstructive pulmonary disease, unspecified: Secondary | ICD-10-CM

## 2016-09-25 MED ORDER — UMECLIDINIUM BROMIDE 62.5 MCG/INH IN AEPB: 1.0000 | INHALATION_SPRAY | Freq: Every day | RESPIRATORY_TRACT | 0 refills | Status: DC

## 2016-09-25 MED ORDER — UMECLIDINIUM BROMIDE 62.5 MCG/INH IN AEPB: 1.0000 | INHALATION_SPRAY | Freq: Every day | RESPIRATORY_TRACT | Status: DC

## 2016-09-25 NOTE — Assessment & Plan Note (Signed)
>   3 min Only smoking one cig per day so hardly nicotine dep   I took an extended  opportunity with this patient to outline the consequences of continued cigarette use  in airway disorders based on all the data we have from the multiple national lung health studies (perfomed over decades at millions of dollars in cost)  indicating that smoking cessation, not choice of inhalers or physicians, is the most important aspect of care.    Follow up per Primary Care planned

## 2016-09-25 NOTE — Assessment & Plan Note (Addendum)
Followed in Pulmonary clinic/ [facility]/ Chris [name]  > changed to prn 09/25/2016  - 04/28/2013 PFT's FEV1 1.21 (39%) ratio 59 and 13% better p B2 and dlco 72 corrects to 102  - started spiriva 04/30/2013 > changed to respimat 02/28/2014 -Med calendar 03/14/2014 > not using as of 05/16/14  - arrived on stiolto / symbicort 02/29/2016 > changed to symbicort 80/spiriva (lower dose ICS due to interaction with aids meds  - PFT's  06/17/2016  FEV1 1.29 (42 % ) ratio 64  p 43 % improvement from saba p symb 80 /spiriva prior to study with DLCO  51/49c % corrects to 86  % for alv volume   - 06/17/2016  After extensive coaching HFA effectiveness =    90% > change symb to 160 2bid  - 09/25/2016  After extensive coaching device  effectiveness =    90% with dpi/ elipta > changed to Incruse per Insurance restrictions     I had an extended final summary discussion with the patient reviewing all relevant studies completed to date and  lasting 15 to 20 minutes of a 25 minute visit on the following issues:   Despite smoking he is relatively well compensated on lama/laba/ics and no exac or saba excess   Formulary restrictions will be an ongoing challenge for the forseable future and I would be happy to pick an alternative if the pt will first  provide me a list of them but pt  will need to return here for training for any new device that is required eg dpi vs hfa vs respimat.    In meantime we can always provide samples so the patient never runs out of any needed respiratory medications.   Each maintenance medication was reviewed in detail including most importantly the difference between maintenance and as needed and under what circumstances the prns are to be used.  Please see AVS for specific  Instructions which are unique to this visit and I personally typed out  which were reviewed in detail in writing with the patient and a copy provided.

## 2016-09-25 NOTE — Assessment & Plan Note (Signed)
Body mass index is 38.1    trending down/ encouraged Lab Results  Component Value Date   TSH 1.507 08/21/2015     Contributing to gerd risk/ doe/reviewed the need and the process to achieve and maintain neg calorie balance > defer f/u primary care including intermittently monitoring thyroid status

## 2016-09-25 NOTE — Patient Instructions (Signed)
Plan A = Automatic =  symbicort 160 Take 2 puffs first thing in am and then another 2 puffs about 12 hours later.                                       Incruse one click each am - take two drags to get it all   Plan B = Backup Only use your albuterol as a rescue medication to be used if you can't catch your breath by resting or doing a relaxed purse lip breathing pattern.  - The less you use it, the better it will work when you need it. - Ok to use the inhaler up to 2 puffs  every 4 hours if you must but call for appointment if use goes up over your usual need - Don't leave home without it !!  (think of it like the spare tire for your car)   Plan C = Crisis - only use your albuterol nebulizer if you first try Plan B and it fails to help > ok to use the nebulizer up to every 4 hours but if start needing it regularly call for immediate appointment  The key is to stop smoking completely before smoking completely stops you!     If you are satisfied with your treatment plan,  let your doctor know and he/she can either refill your medications or you can return here when your prescription runs out.     If in any way you are not 100% satisfied,  please tell us.  If 100% better, tell your friends!  Pulmonary follow up is as needed

## 2016-09-25 NOTE — Progress Notes (Signed)
Subjective:    Patient ID: Chris [name], male    DOB: 10-09-1965   MRN: [id number]    Brief patient profile:  50 yobm active smoker with HIV  and doe x 2009 much worse since March 2014 so referred by [name] to pulmonary clinic 02/15/2013 with GOLD III copd by pfts 04/28/13.   History of Present Illness  02/15/2013 1st pulmonary eval  Cc indolent onset progressive doe x walking slow pace more than 100 ft,  not at rest., 02 dep, doe much worse x 3 months assoc with congested cough esp in am with sev tbsp thick white mucus takes about 30 min to clear.  No better on inhalers tried to date including advair and spiriva - albuterol works the best and using lots of saba and nebs rec Stop advair and spiriva Plan A = Automatic = Start symbicort 160 Take 2 puffs first thing in am and then another 2 puffs about 12 hours later.  Plan B = backup= Only use your albuterol (as a rescue medication to be used if you can't catch your breath by resting or doing a relaxed purse lip breathing pattern. The less you use it, the better it will work when you need it. Ok to use up to 2 puffs every 4 hours Plan C = nebulizer, use this only if plan B doesn't work ok to use up to 4 hours  03/15/2013 f/u ov/Kamen Hanken still smoking  Chief Complaint  Patient presents with  . 4 wk follow up    Breathing worse at times.  Wheezing, chest tightness at times, and cough with yellow to clear mucus at times also reported.  Fatigues easily.   has not followed the previous instructions and symptoms have not really changed  rec Plan A = Automatic = Start symbicort 160 Take 2 puffs first thing in am and then another 2 puffs about 12 hours later.  Plan B = backup= Only use your albuterol (proiare) as a rescue medication to be used if you can't catch your breath by resting or doing a relaxed purse lip breathing pattern. The less you use it, the better it will work when you need it. Ok to use up to 2 puffs every 4 hours Plan C = nebulizer, use  this only if plan B doesn't work after 15 min ok to use up to 4 hours  The key is to stop smoking completely before smoking completely stops you!    04/28/2013 f/u ov/Slyvia Lartigue still smoking  re:  GOLD III/ 02 dep @ 2lpm  Chief Complaint  Patient presents with  . Follow-up    Pt states that his breathing has improved, symbicort seems to be helping some. No new co's today. Using proair approx 4 times per wk.   still doe but has learned to pace himself and overall better but somewhat limited from desired activities even on his best days rec Plan A = Automatic =  symbicort 160 Take 2 puffs first thing in am and then another 2 puffs about 12 hours later.  Add spiriva one capsule daily each am Plan B = backup= Only use your albuterol (proiare) as a rescue medication to be used if you can't catch your breath by resting or doing a relaxed purse lip breathing pattern. The less you use it, the better it will work when you need it. Ok to use up to 2 puffs every 4 hours Plan C = nebulizer, use this only if plan B doesn't work after  15 min ok to use up to 4 hours   03/14/2014 Follow up and Medication review  COPD GOLD III/ smoking only 2x daily / 02 2lpm 24/7/ symbicort  Patient returns for a two-week followup and medication review. We reviewed all his medications. Organized them into a medication calendar with patient education. Patient left a few of his medications at home.  Feels th at his breathing has improved since on Spiriva.  Patient has cut back on smoking. We discussed smoking cessation. Pt has had frequent COPD flare with steroids used x 2 over last 2 months. We discussed sided effects and risk/benefit of frequent steroid use especially along with his HIV meds and ICS inhalers.He verbalized understanding.  rec Use medical med calendar   05/16/2014 f/u ov/Trinh Sanjose re: copd III/ still smoking/ no med calendar /02 dep but not using  Chief Complaint  Patient presents with  . Follow-up    COPD; fatigue;  usually has O2 on 2L, did not bring with him today; sometimes wears  2L at night   does not need saba often, sleeps ok despite taking second dose of symbicort 8 h p first  Also supposed to be on maint spiriva but not clear he's using it  rec The key is to stop smoking completely before smoking completely stops you!  See calendar for specific medication    01/05/2015 f/u ov/Lakshya Mcgillicuddy re: GOLD III copd on Qvar 80 2bid /spiriva  Chief Complaint  Patient presents with  . Follow-up    Pt states that his breathing is doing well overall. He is using albuterol inhaler 3 x per wk on average and uses neb usually once per day.   using proair one or twice and not nebulizer at all  rec 02 can just be used as needed The key is to stop smoking completely before smoking completely stops you!   Admit date: 01/17/2016 Discharge date: 01/18/2016     Recommendations for Outpatient Follow-up:  1. Please decide whether or not patient will require a more prolonged steroid regimen   Discharge Diagnoses:  Active Problems:  Copd exacerbation   Discharge Condition: stable  Diet recommendation: carb modified diet  Filed Weights   01/18/16 0104  Weight: 122.471 kg (270 lb)    History of present illness:  51 year old male with history of DM, HIV, COPD on home oxygen, OSA, ED, obesity who came with cc of dyspnea that started 4 days ago and has been getting worse associated with cough productive of yellowish sputum but no chest pain. No fever or chills. No preceding upper respiratory symptoms. He has mild generalized abdominal pain but no nausea, vomiting, diarrhea or constipation. No other complaints.   Hospital Course:  COPD exacerbation - Improved with steroid and antibiotic administration - Patient much improved and requesting discharge. He has no new complaints. - Continue prednisone for 4 more days to complete a 5 day treatment course. - Should patient's condition worsen he is to go get a  reevaluation with his pcp, urgent care, or ED. Patient verbalizes understanding and agreement. - He assures me that he has oxygen at home.       01/28/2016 extended  f/u ov/re-establish care p Admit/Jemima Petko re: copd G III/ qvar 80 2bid and spiriva and still smoking  Chief Complaint  Patient presents with  . Acute Visit    Pt c/o increased SOB for the past 2 months. He also has had increased cough with yellow sputum.  He is using albuterol inhaler 3  x daily on average and Duoneb at least 2 x daily.    doe = MMRC3 = can't walk 100 yards even at a slow pace at a flat grade s stopping due to sob  Even on 02/ sleeps ok/ was placed on acei but denies taking it   Losing ground again since admit  rec Prednisone 10 mg take  4 each am x 2 days,   2 each am x 2 days,  1 each am x 2 days and stop  Stop all smoking permanently  Plan A = Automatic = Stiolto 2 puffs each am and stop spiriva                                      Continue qvar 80 Take 2 puffs first thing in am and then another 2 puffs about 12 hours later.  Work on inhaler technique:   Plan B = Backup Only use your albuterol as a rescue medication Plan C = Crisis - only use your albuterol nebulizer if you first try Plan B and it fails to help > ok to use the nebulizer up to every 4 hours but if start needing it regularly call for immediate appointment    02/29/2016  f/u ov/Brinly Maietta re:  Copd GOLD III/ symbicort and stiolto / 02 prn / still smoking Chief Complaint  Patient presents with  . Follow-up    Pt states stopped qvar and started back on symbicort and his breathing has improved. He has not had to use albuterol inhaler but uses duoneb once per day. Still smoking 2 cigs per day.   doe = MMRC3 = can't walk 100 yards even at a slow pace at a flat grade s stopping due to sob even on  02  Confused with meds/ instructions : taking two labas now  rec Plan A = Automatic =  Symbicort 80 Take 2 puffs first thing in am and then another 2 puffs  about 12 hours later   And spiriva 2 puffs each am (sample = 4 pffs)  Plan B = Backup Only use your albuterol (ventolin)  as a rescue medication Plan C = Crisis - only use your albuterol nebulizer if you first try Plan B and it fails to help > ok to use the nebulizer up to every 4 hours but if start needing it regularly call for immediate appointment Ok to leave off 02 at rest but wear it at bedtime and as needed daytime    06/17/2016  f/u ov/Aymen Widrig re: GOLD III COPD/ large reversible component  Chief Complaint  Patient presents with  . Follow-up    PFT's done today. Pt states breathing is "alright, I get around pretty good". He states only uses albuterol inhaler evrey other day and neb about 2 x per wk on average.   rec Change symbicort to 160 Take 2 puffs first thing in am and then another 2 puffs about 12 hours later. and contiue spiriva 2 puffs each am    09/25/2016  Extended summary/ final f/u ov/Rashada Klontz re:  GOLD  III copd/ 02 2lpm prn / symb 160 2bid/spiriva  Chief Complaint  Patient presents with  . Follow-up    Breathing seems to be improving. He is using albuterol inhaler once per wk on average.   doe = MMRC2 = can't walk a nl pace on a flat grade s sob but does fine slow  and flat eg does HT shopping ok on 2lpm  Rare need for saba hfa/ hardly ever needs neb now  No obvious day to day or daytime variability or assoc excess/ purulent sputum or mucus plugs or hemoptysis or cp or chest tightness, subjective wheeze or overt sinus or hb symptoms. No unusual exp hx or h/o childhood pna/ asthma or knowledge of premature birth.  Sleeping ok without nocturnal  or early am exacerbation  of respiratory  c/o's or need for noct saba. Also denies any obvious fluctuation of symptoms with weather or environmental changes or other aggravating or alleviating factors except as outlined above   Current Medications, Allergies, Complete Past Medical History, Past Surgical History, Family History, and  Social History were reviewed in [facility] electronic medical record.  ROS  The following are not active complaints unless bolded sore throat, dysphagia, dental problems, itching, sneezing,  nasal congestion or excess/ purulent secretions, ear ache,   fever, chills, sweats, unintended wt loss, classically pleuritic or exertional cp,  orthopnea pnd or leg swelling, presyncope, palpitations, abdominal pain, anorexia, nausea, vomiting, diarrhea  or change in bowel or bladder habits, change in stools or urine, dysuria,hematuria,  rash, arthralgias, visual complaints, headache, numbness, weakness or ataxia or problems with walking or coordination,  change in mood/affect or memory.                      Objective:   Physical Exam   amb obese bm nad / vital signs reviewed  - Note on arrival 02 sats  93% on 2lpm    01/05/2015       278 >  02/29/2016  274 > 06/17/2016 277 > 09/25/2016   258  01/28/2016        272   05/16/14 285 lb 6.4 oz (129.457 kg)  04/20/14 282 lb 12.8 oz (128.277 kg)  03/14/14 274 lb 3.2 oz (124.376 kg)     HEENT: nl dentition, turbinates bilaterally, and oropharynx. Nl external ear canals without cough reflex   NECK :  without JVD/Nodes/TM/ nl carotid upstrokes bilaterally   LUNGS: no acc muscle use, slt barrel contour chest distant bs bilaterally without cough on insp or exp maneuvers   CV:  RRR  no s3 or murmur or increase in P2, and no edema   ABD: quite obese  soft and nontender with nl inspiratory excursion in the supine position. No bruits or organomegaly appreciated, bowel sounds nl  MS:  Nl gait/ ext warm without deformities, calf tenderness, cyanosis or clubbing No obvious joint restrictions   SKIN: warm and dry without lesions    NEURO:  alert, approp, nl sensorium with  no motor or cerebellar deficits apparent.              Assessment & Plan:

## 2016-10-13 ENCOUNTER — Telehealth: Payer: Self-pay | Admitting: Internal Medicine

## 2016-10-13 NOTE — Telephone Encounter (Signed)
Pt calling back a/b appointment.Chris GriffinsStanley A Dalton

## 2016-10-13 NOTE — Telephone Encounter (Signed)
Pt aware of rec's per MW Please advise TP if you can work this patient in today or this week as Dr Sherene SiresWert is 100% booked.  MW has recommended a follow up asap with all meds in hand.  Thanks.

## 2016-10-13 NOTE — Telephone Encounter (Signed)
Spoke with pt. States that since starting Incruse he is having been having issues. Reports chest congestion, cough and increased SOB. Cough is producing white mucus. Per his last OV AVS instructions he was to take Symbicort and Incruse. I have clarified that he is taking both. Pt would like MW's recommendations.  MW - please advise. Thanks.

## 2016-10-13 NOTE — Telephone Encounter (Signed)
Will need to stop incruse and just use the duoneb q 4 h prn and f/u asap with all meds in hand to see me or NP

## 2016-10-13 NOTE — Telephone Encounter (Signed)
Pt cannot do the 3pm because he has to pick up his grandson. Pt is requesting a later appt if possible. Please advise Tammy. Thanks.

## 2016-10-13 NOTE — Telephone Encounter (Signed)
lmtcb x1 for pt. 

## 2016-10-13 NOTE — Telephone Encounter (Signed)
Spoke with Tammy, okay to add on patient later this week.  Pt scheduled for 10/14/16 @ 945 with TP. Pt aware to bring all meds to his appt.  Nothing further needed.

## 2016-10-13 NOTE — Telephone Encounter (Signed)
[phone] pt calling back

## 2016-10-13 NOTE — Telephone Encounter (Signed)
I can see at 3 today  Please contact office for sooner follow up if symptoms do not improve or worsen or seek emergency care

## 2016-10-13 NOTE — Telephone Encounter (Signed)
Pleas advise if patient can be seen at 4pm. (see message below) Thanks.

## 2016-10-14 ENCOUNTER — Telehealth: Payer: Self-pay | Admitting: Internal Medicine

## 2016-10-14 ENCOUNTER — Encounter: Payer: Self-pay | Admitting: Adult Health

## 2016-10-14 ENCOUNTER — Ambulatory Visit (INDEPENDENT_AMBULATORY_CARE_PROVIDER_SITE_OTHER): Payer: Medicare Other | Admitting: Adult Health

## 2016-10-14 DIAGNOSIS — J9611 Chronic respiratory failure with hypoxia: Secondary | ICD-10-CM | POA: Diagnosis not present

## 2016-10-14 DIAGNOSIS — Z720 Tobacco use: Secondary | ICD-10-CM | POA: Diagnosis not present

## 2016-10-14 DIAGNOSIS — J449 Chronic obstructive pulmonary disease, unspecified: Secondary | ICD-10-CM | POA: Diagnosis not present

## 2016-10-14 DIAGNOSIS — J9612 Chronic respiratory failure with hypercapnia: Secondary | ICD-10-CM | POA: Diagnosis not present

## 2016-10-14 DIAGNOSIS — Z794 Long term (current) use of insulin: Principal | ICD-10-CM

## 2016-10-14 DIAGNOSIS — E11628 Type 2 diabetes mellitus with other skin complications: Secondary | ICD-10-CM

## 2016-10-14 MED ORDER — TIOTROPIUM BROMIDE MONOHYDRATE 18 MCG IN CAPS: 18.0000 ug | ORAL_CAPSULE | Freq: Every day | RESPIRATORY_TRACT | 5 refills | Status: DC

## 2016-10-14 MED ORDER — "INSULIN SYRINGE-NEEDLE U-100 31G X 15/64"" 0.5 ML MISC": 5 refills | Status: DC

## 2016-10-14 MED ORDER — DOXYCYCLINE HYCLATE 100 MG PO TABS: 100.0000 mg | ORAL_TABLET | Freq: Two times a day (BID) | ORAL | 0 refills | Status: DC

## 2016-10-14 NOTE — Telephone Encounter (Signed)
Patient called the office to request medication refill for acetaminophen-codeine (TYLENOL #3) 300-30 MG tablet. ° °Thank you.  °

## 2016-10-14 NOTE — Addendum Note (Signed)
Addended by: Boone MasterJONES, JESSICA E on: 10/14/2016 10:49 AM   Modules accepted: Orders

## 2016-10-14 NOTE — Telephone Encounter (Signed)
Syringes refilled.

## 2016-10-14 NOTE — Telephone Encounter (Signed)
Patient called the office to request medication refill for Insulin Syringe-Needle U-100 (BD INSULIN SYRINGE ULTRAFINE) 31G X 15/64" 0.5 ML MISC.   Thank you.

## 2016-10-14 NOTE — Addendum Note (Signed)
Addended by: Boone MasterJONES, JESSICA E on: 10/14/2016 11:38 AM   Modules accepted: Orders

## 2016-10-14 NOTE — Assessment & Plan Note (Signed)
Smoking cessation  

## 2016-10-14 NOTE — Progress Notes (Signed)
'@Patient'  ID: Chris Round., male    DOB: 09-20-1965, 51 y.o.   MRN: [id number]  Chief Complaint  Patient presents with  . Acute Visit    COPD    Referring provider: Tresa Garter, MD  HPI: 47 yobm active smoker with HIV  and doe x 2009 much worse since March 2014 so referred by Chris [name] to pulmonary clinic 02/15/2013 with GOLD III copd by pfts 04/28/13.  10/14/2016 Acute OV  Pt presents for an acute office visit. Complains of 2 weeks of cough ,congestion , wheezing , watery eyes and thick mucus . Mucus is white. No fever or chest pain.  Seen in office 3 weeks , was changed from Spiriva to INCRUSE due to insurance coverage.  On Symbicort Twice daily  . He feels the INCRUSE is making him worse.  Called pharmacy and they will cover Spiriva handihaler.  Denies hemopytis s, leg swelling or orthopnea, has alog of post nasal drip  . Good appetite  Taking Dayquil  And cold meds for sx. Not helping .   Remains on O2 at 2l/m .     Allergies  Allergen Reactions  . Bactrim [Sulfamethoxazole-Trimethoprim] Hives    Immunization History  Administered Date(s) Administered  . Hepatitis A, Adult 03/03/2013, 12/21/2014  . Influenza Split 03/11/2013  . Influenza Whole 05/11/2012  . Influenza,inj,Quad PF,36+ Mos 04/20/2014, 06/18/2015, 06/23/2016  . PPD Test 03/16/2013  . Pneumococcal Polysaccharide-23 05/11/2012    Past Medical History:  Diagnosis Date  . COPD (chronic obstructive pulmonary disease) ([place])   . Depression   . Diabetes mellitus without complication ([place])   . Dyspnea   . Emphysema   . GERD (gastroesophageal reflux disease)   . HIV disease ([place])     Tobacco History: History  Smoking Status  . Current Some Day Smoker  . Packs/day: 0.25  . Types: Cigarettes  Smokeless Tobacco  . Never Used    Comment: Started smoking at age 28.  Currently smoking 3 cig per day. cautioned with O2 use   Ready to quit: No Counseling given: Yes   Outpatient Encounter  Prescriptions as of 10/14/2016  Medication Sig  . ACCU-CHEK SOFTCLIX LANCETS lancets Use as instructed  . acetaminophen-codeine (TYLENOL #3) 300-30 MG tablet Take 1 tablet by mouth every 8 (eight) hours as needed for moderate pain.  [name] albuterol (PROVENTIL HFA;VENTOLIN HFA) 108 (90 Base) MCG/ACT inhaler Inhale 2 puffs into the lungs every 4 (four) hours as needed for wheezing or shortness of breath (((PLAN B))).  . Blood Glucose Monitoring Suppl (ACCU-CHEK AVIVA PLUS) W/DEVICE KIT 250 Test up to 4 times daily  . budesonide-formoterol (SYMBICORT) 160-4.5 MCG/ACT inhaler Take 2 puffs first thing in am and then another 2 puffs about 12 hours later.  . clotrimazole (LOTRIMIN) 1 % cream Apply 1 application topically 2 (two) times daily.  [name] elvitegravir-cobicistat-emtricitabine-tenofovir (GENVOYA) 150-150-200-10 MG TABS tablet Take 1 tablet by mouth daily with breakfast.  . EZ SMART BLOOD GLUCOSE LANCETS MISC 1 Units by Does not apply route 4 (four) times daily.  [name] glucose blood test strip Use as instructed  . insulin glargine (LANTUS) 100 UNIT/ML injection Inject 0.2 mLs (20 Units total) into the skin at bedtime.  . Insulin Pen Needle 32G X 5 MM MISC Use as directed  . Insulin Syringe-Needle U-100 (BD INSULIN SYRINGE ULTRAFINE) 31G X 15/64" 0.5 ML MISC Use as directed  . ipratropium-albuterol (DUONEB) 0.5-2.5 (3) MG/3ML SOLN Take 3 mLs by nebulization every 6 (six) hours  as needed. (Patient taking differently: Take 3 mLs by nebulization every 6 (six) hours as needed (shortness of breath). )  . metFORMIN (GLUCOPHAGE) 1000 MG tablet Take 1 tablet (1,000 mg total) by mouth 2 (two) times daily with a meal.  . OXYGEN Place 2 L/min into the nose as needed.   . pantoprazole (PROTONIX) 40 MG tablet Take 1 tablet (40 mg total) by mouth 2 (two) times daily before a meal.  . sildenafil (VIAGRA) 50 MG tablet Take 0.5 tablets (25 mg total) by mouth daily as needed for erectile dysfunction.  [name] umeclidinium bromide  (INCRUSE ELLIPTA) 62.5 MCG/INH AEPB Inhale 1 puff into the lungs daily.  [name] doxycycline (VIBRA-TABS) 100 MG tablet Take 1 tablet (100 mg total) by mouth 2 (two) times daily.   No facility-administered encounter medications on file as of 10/14/2016.      Review of Systems  Constitutional:   No  weight loss, night sweats,  Fevers, chills, fatigue, or  lassitude.  HEENT:   No headaches,  Difficulty swallowing,  Tooth/dental problems, or  Sore throat,                No sneezing, itching, ear ache, + nasal congestion, post nasal drip,   CV:  No chest pain,  Orthopnea, PND, swelling in lower extremities, anasarca, dizziness, palpitations, syncope.   GI  No heartburn, indigestion, abdominal pain, nausea, vomiting, diarrhea, change in bowel habits, loss of appetite, bloody stools.   Resp: .  No chest wall deformity  Skin: no rash or lesions.  GU: no dysuria, change in color of urine, no urgency or frequency.  No flank pain, no hematuria   MS:  No joint pain or swelling.  No decreased range of motion.  No back pain.    Physical Exam  BP 114/62 (BP Location: Left Arm, Cuff Size: Normal)   Pulse 79   Ht '5\' 9"'  (1.753 m)   Wt 261 lb 3.2 oz (118.5 kg)   SpO2 94%   BMI 38.57 kg/m   GEN: A/Ox3; pleasant , NAD, chronically ill appearing on o2    HEENT:  [place]/AT,  EACs-clear, TMs-wnl, NOSE-clear drainage  THROAT-clear, no lesions, no postnasal drip or exudate noted.   NECK:  Supple w/ fair ROM; no JVD; normal carotid impulses w/o bruits; no thyromegaly or nodules palpated; no lymphadenopathy.  No stridor   RESP  Clear  P & A; w/o, wheezes/ rales/ or rhonchi. no accessory muscle use, no dullness to percussion, speaks in full sentences   CARD:  RRR, no m/r/g, no peripheral edema, pulses intact, no cyanosis or clubbing.  GI:   Soft & nt; nml bowel sounds; no organomegaly or masses detected.   Musco: Warm bil, no deformities or joint swelling noted.   Neuro: alert, no focal deficits noted.     Skin: Warm, no lesions or rashes    Lab Results:     Imaging: No results found.   Assessment & Plan:   COPD  GOLD III, still smoking Flare with URI /Bronchitis /AR  Check cxr today  Begin Abx  Control for triggers  Pt education on ICS /Steroids with Antivirals.  Change back to [place]  Patient Instructions  Stop INCRUSE  Begin Spiriva Handihaler 1 puff daily .  Continue on Symbicort Twice daily. , brush rinse and gargle after use.  Important to quit smoking  Chest xray today .  Doxycycline 158m Twice daily  For 1 week , take with food.  Zyrtec 74m At bedtime  As needed  Drainage  Mucinex DM Twice daily As needed  Cough/congestion  Continue on O2 2l/m .  Follow up Dr. WMelvyn Novas In 4 weeks and As needed   Please contact office for sooner follow up if symptoms do not improve or worsen or seek emergency care      Nicotine abuse Smoking cessation   Chronic respiratory failure with hypoxia and hypercapnia (HCC) Continue on Oxygen 2l/m  follow up for sleep study ordered PCP .      TRexene Edison NP 10/14/2016

## 2016-10-14 NOTE — Assessment & Plan Note (Signed)
Flare with URI /Bronchitis /AR  Check cxr today  Begin Abx  Control for triggers  Pt education on ICS /Steroids with Antivirals.  [facility]   Plan  Patient Instructions  Stop INCRUSE  Begin Spiriva Handihaler 1 puff daily .  Continue on Symbicort Twice daily. , brush rinse and gargle after use.  Important to quit smoking  Chest xray today .  Doxycycline 100mg  Twice daily  For 1 week , take with food.  Zyrtec 10mg  At bedtime  As needed  Drainage  Mucinex DM Twice daily As needed  Cough/congestion  Continue on O2 2l/m .  Follow up Dr. Sherene SiresWert  In 4 weeks and As needed   Please contact office for sooner follow up if symptoms do not improve or worsen or seek emergency care

## 2016-10-14 NOTE — Patient Instructions (Addendum)
Stop INCRUSE  Begin Spiriva Handihaler 1 puff daily .  Continue on Symbicort Twice daily. , brush rinse and gargle after use.  Important to quit smoking  Chest xray today .  Doxycycline 100mg  Twice daily  For 1 week , take with food.  Zyrtec 10mg  At bedtime  As needed  Drainage  Mucinex DM Twice daily As needed  Cough/congestion  Continue on O2 2l/m .  Follow up Dr. Sherene SiresWert  In 4 weeks and As needed   Please contact office for sooner follow up if symptoms do not improve or worsen or seek emergency care

## 2016-10-14 NOTE — Assessment & Plan Note (Signed)
Continue on Oxygen 2l/m  follow up for sleep study ordered PCP .

## 2016-10-14 NOTE — Progress Notes (Signed)
Chart and office note reviewed in detail  > agree with a/p as outlined    

## 2016-10-16 ENCOUNTER — Other Ambulatory Visit: Payer: Self-pay | Admitting: *Deleted

## 2016-10-16 DIAGNOSIS — M1711 Unilateral primary osteoarthritis, right knee: Secondary | ICD-10-CM

## 2016-10-16 MED ORDER — ACETAMINOPHEN-CODEINE #3 300-30 MG PO TABS: 1.0000 | ORAL_TABLET | Freq: Three times a day (TID) | ORAL | 0 refills | Status: DC | PRN

## 2016-10-16 NOTE — Telephone Encounter (Signed)
Patient received last refill on 09/17/16. Refill was placed today and taken to the front desk for pickup.

## 2016-10-23 ENCOUNTER — Telehealth: Payer: Self-pay | Admitting: *Deleted

## 2016-10-23 NOTE — Telephone Encounter (Signed)
Patient called c/o chest congestion. Advised him to call Internal Medicine to request an appt with his PCP. Scheduled him a folllow up appointment with Dr. Drue SecondSnider on 11/13/16. Wendall MolaJacqueline Brysan Mcevoy

## 2016-10-28 ENCOUNTER — Ambulatory Visit: Payer: Medicare Other | Admitting: Podiatry

## 2016-10-30 IMAGING — DX DG CHEST 2V
2 series · 2 of 2 positions shown · non-contrast
Comparison: Chest x-rays dated 08/13/2015, 07/16/2015 and
12/06/2014.

CLINICAL DATA: Shortness of breath for 3 days, worsening today.

History of HIV, COPD, emphysema, diabetes.  Smoker.
EXAM:
CHEST  2 VIEW

[chest pa]
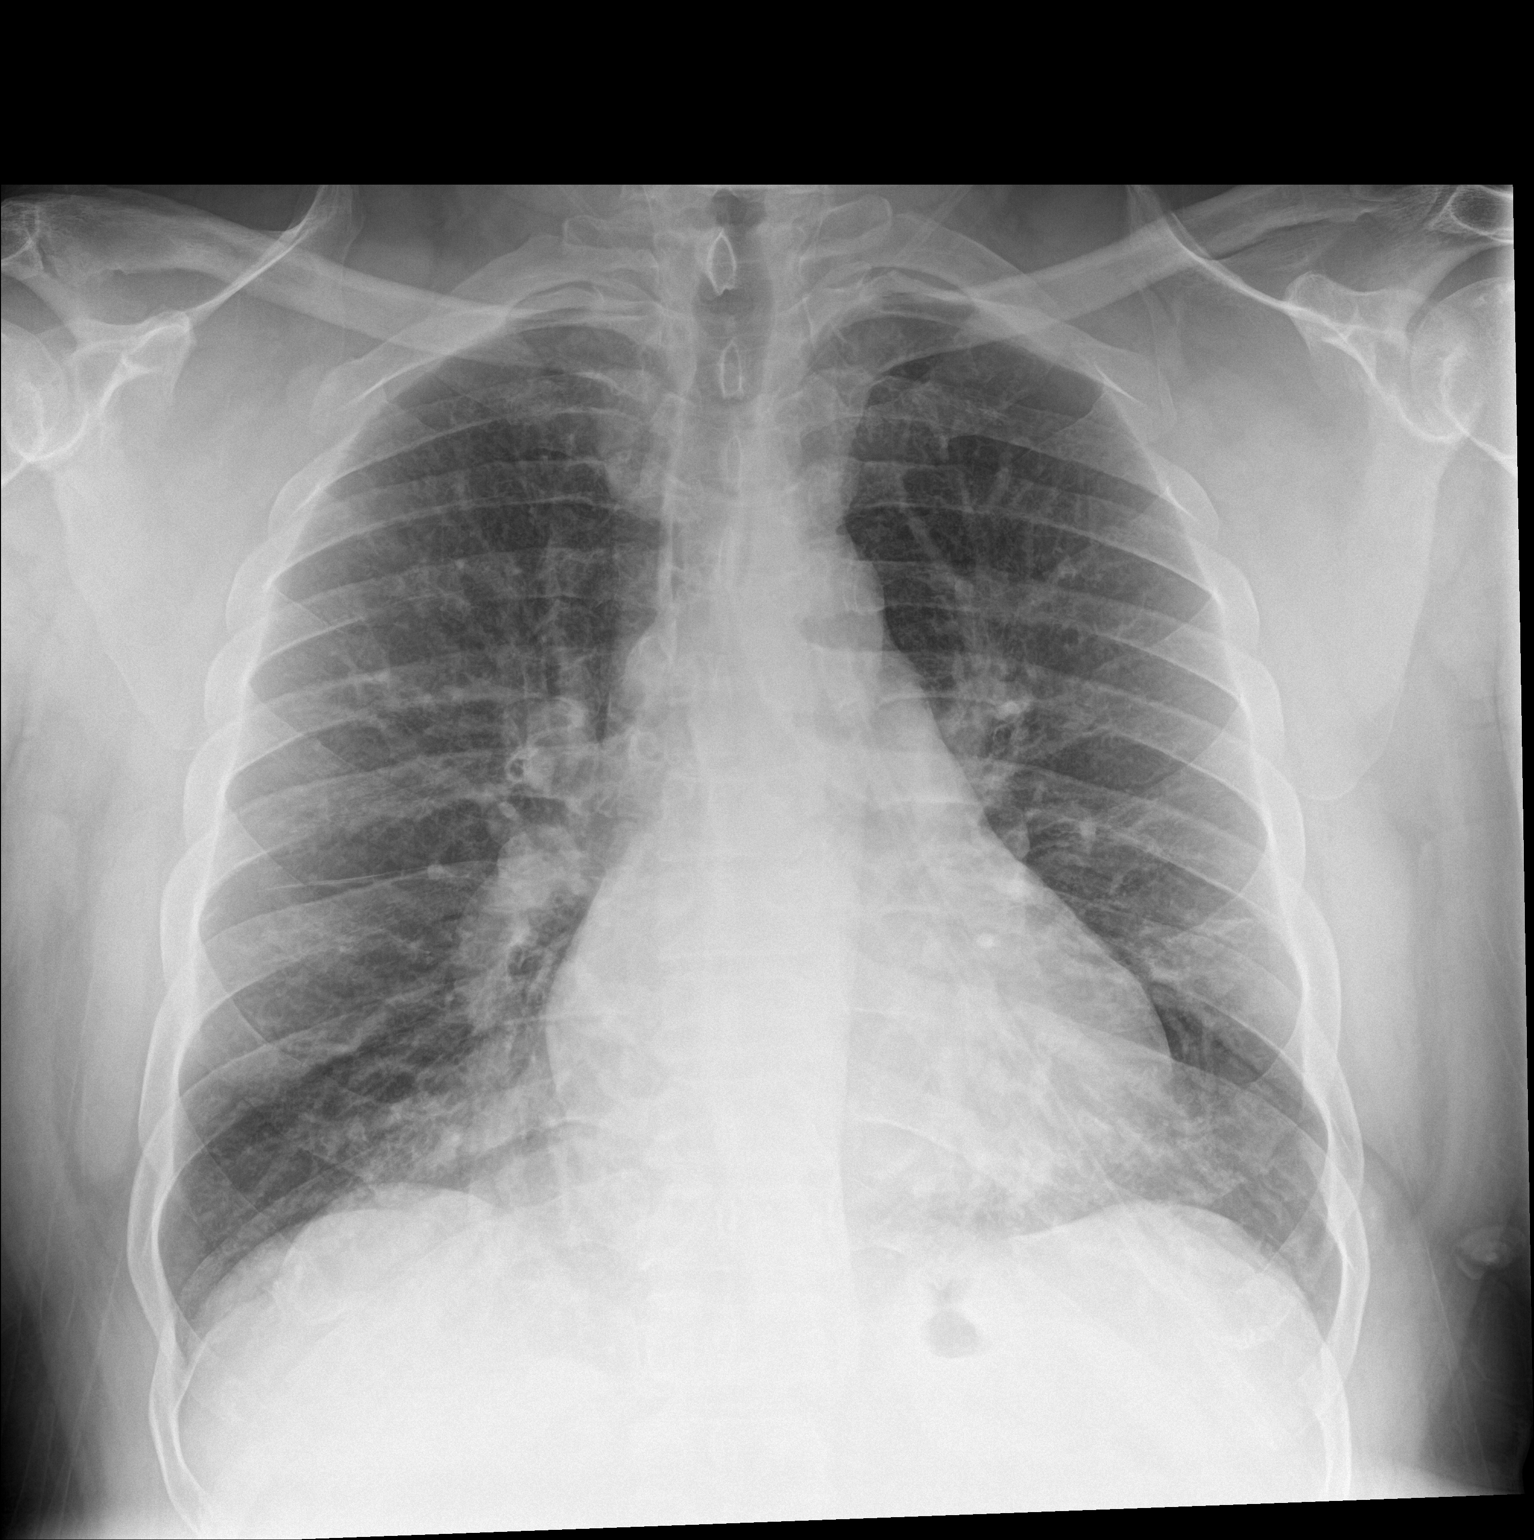

[chest lat]
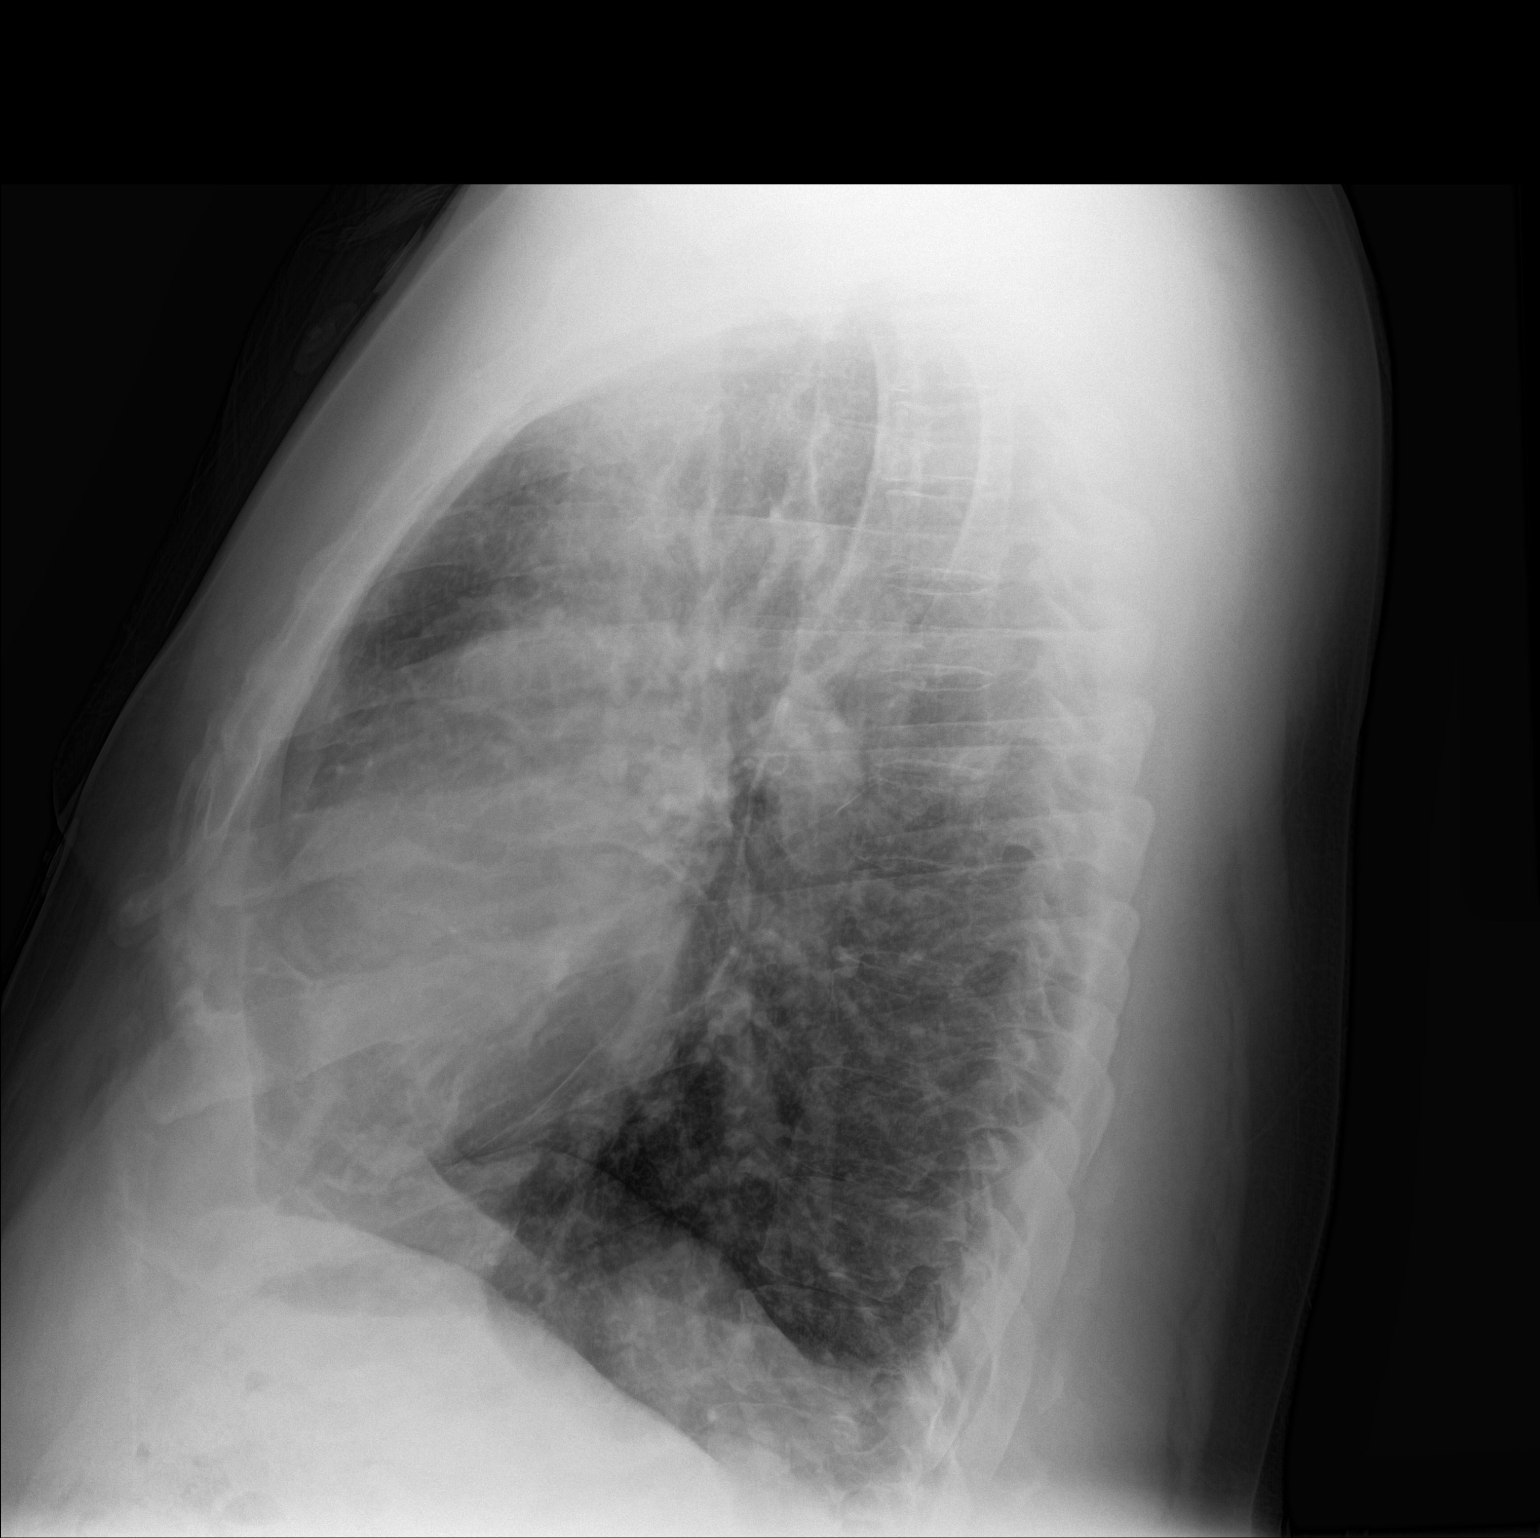

[2 of 2 positions shown; findings below may reference images not displayed]

FINDINGS: Mild cardiomegaly is stable. Lungs are at least mildly hyperexpanded
compatible with the given history of COPD/ emphysema. Suspect
associated chronic bronchitic changes centrally.

Coarse interstitial lung markings appear stable compared to multiple
prior studies suggesting some degree of associated chronic
interstitial lung disease. No new lung findings. No evidence of
pneumonia. No pleural effusion or pneumothorax seen. Osseous
structures about the chest are unremarkable.
IMPRESSION: 1. Lungs at least mildly hyperexpanded, compatible with given
history of COPD/emphysema. Suspect associated chronic bronchitic
changes centrally and chronic interstitial lung disease.
2. No acute findings.  No evidence of pneumonia.
3. Stable mild cardiomegaly.  No evidence of active CHF.

## 2016-11-02 ENCOUNTER — Ambulatory Visit (HOSPITAL_BASED_OUTPATIENT_CLINIC_OR_DEPARTMENT_OTHER): Payer: Medicare Other | Attending: Internal Medicine

## 2016-11-03 ENCOUNTER — Other Ambulatory Visit: Payer: Self-pay | Admitting: Pharmacist

## 2016-11-03 MED ORDER — UMECLIDINIUM BROMIDE 62.5 MCG/INH IN AEPB: 1.0000 | INHALATION_SPRAY | Freq: Every day | RESPIRATORY_TRACT | 2 refills | Status: DC

## 2016-11-05 ENCOUNTER — Ambulatory Visit: Payer: Medicare Other | Admitting: Podiatry

## 2016-11-12 ENCOUNTER — Encounter: Payer: Self-pay | Admitting: Internal Medicine

## 2016-11-12 ENCOUNTER — Telehealth: Payer: Self-pay

## 2016-11-12 ENCOUNTER — Ambulatory Visit: Payer: Medicare Other | Attending: Internal Medicine | Admitting: Internal Medicine

## 2016-11-12 VITALS — BP 136/77 | HR 92 | Temp 97.4°F | Resp 20 | Ht 69.0 in | Wt 265.0 lb

## 2016-11-12 DIAGNOSIS — Z6839 Body mass index (BMI) 39.0-39.9, adult: Secondary | ICD-10-CM | POA: Insufficient documentation

## 2016-11-12 DIAGNOSIS — K219 Gastro-esophageal reflux disease without esophagitis: Secondary | ICD-10-CM | POA: Insufficient documentation

## 2016-11-12 DIAGNOSIS — F17200 Nicotine dependence, unspecified, uncomplicated: Secondary | ICD-10-CM | POA: Insufficient documentation

## 2016-11-12 DIAGNOSIS — B20 Human immunodeficiency virus [HIV] disease: Secondary | ICD-10-CM | POA: Insufficient documentation

## 2016-11-12 DIAGNOSIS — Z882 Allergy status to sulfonamides status: Secondary | ICD-10-CM | POA: Diagnosis not present

## 2016-11-12 DIAGNOSIS — F329 Major depressive disorder, single episode, unspecified: Secondary | ICD-10-CM | POA: Insufficient documentation

## 2016-11-12 DIAGNOSIS — J440 Chronic obstructive pulmonary disease with acute lower respiratory infection: Secondary | ICD-10-CM | POA: Insufficient documentation

## 2016-11-12 DIAGNOSIS — E118 Type 2 diabetes mellitus with unspecified complications: Secondary | ICD-10-CM | POA: Diagnosis not present

## 2016-11-12 DIAGNOSIS — Z9981 Dependence on supplemental oxygen: Secondary | ICD-10-CM | POA: Insufficient documentation

## 2016-11-12 DIAGNOSIS — M1711 Unilateral primary osteoarthritis, right knee: Secondary | ICD-10-CM | POA: Insufficient documentation

## 2016-11-12 DIAGNOSIS — J449 Chronic obstructive pulmonary disease, unspecified: Secondary | ICD-10-CM

## 2016-11-12 DIAGNOSIS — J209 Acute bronchitis, unspecified: Secondary | ICD-10-CM | POA: Insufficient documentation

## 2016-11-12 DIAGNOSIS — G4733 Obstructive sleep apnea (adult) (pediatric): Secondary | ICD-10-CM | POA: Diagnosis not present

## 2016-11-12 DIAGNOSIS — Z79899 Other long term (current) drug therapy: Secondary | ICD-10-CM | POA: Insufficient documentation

## 2016-11-12 DIAGNOSIS — N529 Male erectile dysfunction, unspecified: Secondary | ICD-10-CM | POA: Insufficient documentation

## 2016-11-12 DIAGNOSIS — Z794 Long term (current) use of insulin: Secondary | ICD-10-CM | POA: Insufficient documentation

## 2016-11-12 LAB — POCT URINALYSIS DIPSTICK
Bilirubin, UA: NEGATIVE
Blood, UA: NEGATIVE
Glucose, UA: 500
Ketones, UA: NEGATIVE
LEUKOCYTES UA: NEGATIVE
NITRITE UA: NEGATIVE
PH UA: 5.5 (ref 5.0–8.0)
Spec Grav, UA: 1.01 (ref 1.030–1.035)
Urobilinogen, UA: 1 (ref ?–2.0)

## 2016-11-12 LAB — GLUCOSE, POCT (MANUAL RESULT ENTRY): POC Glucose: 327 mg/dl — AB (ref 70–99)

## 2016-11-12 LAB — POCT GLYCOSYLATED HEMOGLOBIN (HGB A1C): HEMOGLOBIN A1C: 9.3

## 2016-11-12 MED ORDER — DOXYCYCLINE HYCLATE 100 MG PO TABS: 100.0000 mg | ORAL_TABLET | Freq: Two times a day (BID) | ORAL | 0 refills | Status: DC

## 2016-11-12 MED ORDER — ACETAMINOPHEN-CODEINE #3 300-30 MG PO TABS: 1.0000 | ORAL_TABLET | Freq: Three times a day (TID) | ORAL | 0 refills | Status: DC | PRN

## 2016-11-12 MED ORDER — GUAIFENESIN-DM 100-10 MG/5ML PO SYRP: 5.0000 mL | ORAL_SOLUTION | ORAL | 0 refills | Status: DC | PRN

## 2016-11-12 NOTE — Telephone Encounter (Signed)
[facility] sent a fax for PA on Symbicort. PA done on covermymeds and I will await response.

## 2016-11-12 NOTE — Patient Instructions (Signed)
Diabetes and Foot Care Diabetes may cause you to have problems because of poor blood supply (circulation) to your feet and legs. This may cause the skin on your feet to become thinner, break easier, and heal more slowly. Your skin may become dry, and the skin may peel and crack. You may also have nerve damage in your legs and feet causing decreased feeling in them. You may not notice minor injuries to your feet that could lead to infections or more serious problems. Taking care of your feet is one of the most important things you can do for yourself. Follow these instructions at home:  Wear shoes at all times, even in the house. Do not go barefoot. Bare feet are easily injured.  Check your feet daily for blisters, cuts, and redness. If you cannot see the bottom of your feet, use a mirror or ask someone for help.  Wash your feet with warm water (do not use hot water) and mild soap. Then pat your feet and the areas between your toes until they are completely dry. Do not soak your feet as this can dry your skin.  Apply a moisturizing lotion or petroleum jelly (that does not contain alcohol and is unscented) to the skin on your feet and to dry, brittle toenails. Do not apply lotion between your toes.  Trim your toenails straight across. Do not dig under them or around the cuticle. File the edges of your nails with an emery board or nail file.  Do not cut corns or calluses or try to remove them with medicine.  Wear clean socks or stockings every day. Make sure they are not too tight. Do not wear knee-high stockings since they may decrease blood flow to your legs.  Wear shoes that fit properly and have enough cushioning. To break in new shoes, wear them for just a few hours a day. This prevents you from injuring your feet. Always look in your shoes before you put them on to be sure there are no objects inside.  Do not cross your legs. This may decrease the blood flow to your feet.  If you find a  minor scrape, cut, or break in the skin on your feet, keep it and the skin around it clean and dry. These areas may be cleansed with mild soap and water. Do not cleanse the area with peroxide, alcohol, or iodine.  When you remove an adhesive bandage, be sure not to damage the skin around it.  If you have a wound, look at it several times a day to make sure it is healing.  Do not use heating pads or hot water bottles. They may burn your skin. If you have lost feeling in your feet or legs, you may not know it is happening until it is too late.  Make sure your health care provider performs a complete foot exam at least annually or more often if you have foot problems. Report any cuts, sores, or bruises to your health care provider immediately. Contact a health care provider if:  You have an injury that is not healing.  You have cuts or breaks in the skin.  You have an ingrown nail.  You notice redness on your legs or feet.  You feel burning or tingling in your legs or feet.  You have pain or cramps in your legs and feet.  Your legs or feet are numb.  Your feet always feel cold. Get help right away if:  There is increasing   redness, swelling, or pain in or around a wound.  There is a red line that goes up your leg.  Pus is coming from a wound.  You develop a fever or as directed by your health care provider.  You notice a bad smell coming from an ulcer or wound. This information is not intended to replace advice given to you by your health care provider. Make sure you discuss any questions you have with your health care provider. Document Released: 07/25/2000 Document Revised: 01/03/2016 Document Reviewed: 01/04/2013 Elsevier Interactive Patient Education  2017 [employer]. Diabetes Mellitus and Food It is important for you to manage your blood sugar (glucose) level. Your blood glucose level can be greatly affected by what you eat. Eating healthier foods in the appropriate  amounts throughout the day at about the same time each day will help you control your blood glucose level. It can also help slow or prevent worsening of your diabetes mellitus. Healthy eating may even help you improve the level of your blood pressure and reach or maintain a healthy weight. General recommendations for healthful eating and cooking habits include:  Eating meals and snacks regularly. Avoid going long periods of time without eating to lose weight.  Eating a diet that consists mainly of plant-based foods, such as fruits, vegetables, nuts, legumes, and whole grains.  Using low-heat cooking methods, such as baking, instead of high-heat cooking methods, such as deep frying. Work with your dietitian to make sure you understand how to use the Nutrition Facts information on food labels. How can food affect me? Carbohydrates  Carbohydrates affect your blood glucose level more than any other type of food. Your dietitian will help you determine how many carbohydrates to eat at each meal and teach you how to count carbohydrates. Counting carbohydrates is important to keep your blood glucose at a healthy level, especially if you are using insulin or taking certain medicines for diabetes mellitus. Alcohol  Alcohol can cause sudden decreases in blood glucose (hypoglycemia), especially if you use insulin or take certain medicines for diabetes mellitus. Hypoglycemia can be a life-threatening condition. Symptoms of hypoglycemia (sleepiness, dizziness, and disorientation) are similar to symptoms of having too much alcohol. If your health care provider has given you approval to drink alcohol, do so in moderation and use the following guidelines:  Women should not have more than one drink per day, and men should not have more than two drinks per day. One drink is equal to:  12 oz of beer.  5 oz of wine.  1 oz of hard liquor.  Do not drink on an empty stomach.  Keep yourself hydrated. Have water,  diet soda, or unsweetened iced tea.  Regular soda, juice, and other mixers might contain a lot of carbohydrates and should be counted. What foods are not recommended? As you make food choices, it is important to remember that all foods are not the same. Some foods have fewer nutrients per serving than other foods, even though they might have the same number of calories or carbohydrates. It is difficult to get your body what it needs when you eat foods with fewer nutrients. Examples of foods that you should avoid that are high in calories and carbohydrates but low in nutrients include:  Trans fats (most processed foods list trans fats on the Nutrition Facts label).  Regular soda.  Juice.  Candy.  Sweets, such as cake, pie, doughnuts, and cookies.  Fried foods. What foods can I eat? Eat nutrient-rich foods,  which will nourish your body and keep you healthy. The food you should eat also will depend on several factors, including:  The calories you need.  The medicines you take.  Your weight.  Your blood glucose level.  Your blood pressure level.  Your cholesterol level. You should eat a variety of foods, including:  Protein.  Lean cuts of meat.  Proteins low in saturated fats, such as fish, egg whites, and beans. Avoid processed meats.  Fruits and vegetables.  Fruits and vegetables that may help control blood glucose levels, such as apples, mangoes, and yams.  Dairy products.  Choose fat-free or low-fat dairy products, such as milk, yogurt, and cheese.  Grains, bread, pasta, and rice.  Choose whole grain products, such as multigrain bread, whole oats, and brown rice. These foods may help control blood pressure.  Fats.  Foods containing healthful fats, such as nuts, avocado, olive oil, canola oil, and fish. Does everyone with diabetes mellitus have the same meal plan? Because every person with diabetes mellitus is different, there is not one meal plan that works for  everyone. It is very important that you meet with a dietitian who will help you create a meal plan that is just right for you. This information is not intended to replace advice given to you by your health care provider. Make sure you discuss any questions you have with your health care provider. Document Released: 04/24/2005 Document Revised: 01/03/2016 Document Reviewed: 06/24/2013 Elsevier Interactive Patient Education  2017 [employer] Chronic Obstructive Pulmonary Disease Chronic obstructive pulmonary disease (COPD) is a common lung condition in which airflow from the lungs is limited. COPD is a general term that can be used to describe many different lung problems that limit airflow, including both chronic bronchitis and emphysema. If you have COPD, your lung function will probably never return to normal, but there are measures you can take to improve lung function and make yourself feel better. What are the causes?  Smoking (common).  Exposure to secondhand smoke.  Genetic problems.  Chronic inflammatory lung diseases or recurrent infections. What are the signs or symptoms?  Shortness of breath, especially with physical activity.  Deep, persistent (chronic) cough with a large amount of thick mucus.  Wheezing.  Rapid breaths (tachypnea).  Gray or bluish discoloration (cyanosis) of the skin, especially in your fingers, toes, or lips.  Fatigue.  Weight loss.  Frequent infections or episodes when breathing symptoms become much worse (exacerbations).  Chest tightness. How is this diagnosed? Your health care provider will take a medical history and perform a physical examination to diagnose COPD. Additional tests for COPD may include:  Lung (pulmonary) function tests.  Chest X-ray.  CT scan.  Blood tests. How is this treated? Treatment for COPD may include:  Inhaler and nebulizer medicines. These help manage the symptoms of COPD and make your breathing more  comfortable.  Supplemental oxygen. Supplemental oxygen is only helpful if you have a low oxygen level in your blood.  Exercise and physical activity. These are beneficial for nearly all people with COPD.  Lung surgery or transplant.  Nutrition therapy to gain weight, if you are underweight.  Pulmonary rehabilitation. This may involve working with a team of health care providers and specialists, such as respiratory, occupational, and physical therapists. Follow these instructions at home:  Take all medicines (inhaled or pills) as directed by your health care provider.  Avoid over-the-counter medicines or cough syrups that dry up your airway (such as antihistamines) and slow  down the elimination of secretions unless instructed otherwise by your health care provider.  If you are a smoker, the most important thing that you can do is stop smoking. Continuing to smoke will cause further lung damage and breathing trouble. Ask your health care provider for help with quitting smoking. He or she can direct you to community resources or hospitals that provide support.  Avoid exposure to irritants such as smoke, chemicals, and fumes that aggravate your breathing.  Use oxygen therapy and pulmonary rehabilitation if directed by your health care provider. If you require home oxygen therapy, ask your health care provider whether you should purchase a pulse oximeter to measure your oxygen level at home.  Avoid contact with individuals who have a contagious illness.  Avoid extreme temperature and humidity changes.  Eat healthy foods. Eating smaller, more frequent meals and resting before meals may help you maintain your strength.  Stay active, but balance activity with periods of rest. Exercise and physical activity will help you maintain your ability to do things you want to do.  Preventing infection and hospitalization is very important when you have COPD. Make sure to receive all the vaccines your  health care provider recommends, especially the pneumococcal and influenza vaccines. Ask your health care provider whether you need a pneumonia vaccine.  Learn and use relaxation techniques to manage stress.  Learn and use controlled breathing techniques as directed by your health care provider. Controlled breathing techniques include: 1. Pursed lip breathing. Start by breathing in (inhaling) through your nose for 1 second. Then, purse your lips as if you were going to whistle and breathe out (exhale) through the pursed lips for 2 seconds. 2. Diaphragmatic breathing. Start by putting one hand on your abdomen just above your waist. Inhale slowly through your nose. The hand on your abdomen should move out. Then purse your lips and exhale slowly. You should be able to feel the hand on your abdomen moving in as you exhale.  Learn and use controlled coughing to clear mucus from your lungs. Controlled coughing is a series of short, progressive coughs. The steps of controlled coughing are: 1. Lean your head slightly forward. 2. Breathe in deeply using diaphragmatic breathing. 3. Try to hold your breath for 3 seconds. 4. Keep your mouth slightly open while coughing twice. 5. Spit any mucus out into a tissue. 6. Rest and repeat the steps once or twice as needed. Contact a health care provider if:  You are coughing up more mucus than usual.  There is a change in the color or thickness of your mucus.  Your breathing is more labored than usual.  Your breathing is faster than usual. Get help right away if:  You have shortness of breath while you are resting.  You have shortness of breath that prevents you from:  Being able to talk.  Performing your usual physical activities.  You have chest pain lasting longer than 5 minutes.  Your skin color is more cyanotic than usual.  You measure low oxygen saturations for longer than 5 minutes with a pulse oximeter. This information is not intended to  replace advice given to you by your health care provider. Make sure you discuss any questions you have with your health care provider. Document Released: 05/07/2005 Document Revised: 01/03/2016 Document Reviewed: 03/24/2013 Elsevier Interactive Patient Education  2017 [employer] Upper Respiratory Infection, Adult Most upper respiratory infections (URIs) are caused by a virus. A URI affects the nose, throat, and upper air passages.  The most common type of URI is often called "the common cold." Follow these instructions at home:  Take medicines only as told by your doctor.  Gargle warm saltwater or take cough drops to comfort your throat as told by your doctor.  Use a warm mist humidifier or inhale steam from a shower to increase air moisture. This may make it easier to breathe.  Drink enough fluid to keep your pee (urine) clear or pale yellow.  Eat soups and other clear broths.  Have a healthy diet.  Rest as needed.  Go back to work when your fever is gone or your doctor says it is okay.  You may need to stay home longer to avoid giving your URI to others.  You can also wear a face mask and wash your hands often to prevent spread of the virus.  Use your inhaler more if you have asthma.  Do not use any tobacco products, including cigarettes, chewing tobacco, or electronic cigarettes. If you need help quitting, ask your doctor. Contact a doctor if:  You are getting worse, not better.  Your symptoms are not helped by medicine.  You have chills.  You are getting more short of breath.  You have brown or red mucus.  You have yellow or brown discharge from your nose.  You have pain in your face, especially when you bend forward.  You have a fever.  You have puffy (swollen) neck glands.  You have pain while swallowing.  You have white areas in the back of your throat. Get help right away if:  You have very bad or constant:  Headache.  Ear pain.  Pain in your  forehead, behind your eyes, and over your cheekbones (sinus pain).  Chest pain.  You have long-lasting (chronic) lung disease and any of the following:  Wheezing.  Long-lasting cough.  Coughing up blood.  A change in your usual mucus.  You have a stiff neck.  You have changes in your:  Vision.  Hearing.  Thinking.  Mood. This information is not intended to replace advice given to you by your health care provider. Make sure you discuss any questions you have with your health care provider. Document Released: 01/14/2008 Document Revised: 03/30/2016 Document Reviewed: 11/02/2013 Elsevier Interactive Patient Education  2017 [employer].

## 2016-11-12 NOTE — Progress Notes (Signed)
Chris [name], is a 51 y.o. male  ZOX:[id number]  WJX:[id number]  DOB - 04/01/1966  Chief Complaint  Patient presents with  . URI       Subjective:   Chris [name] a 51 y.o. male with multiple medical history including COPD on home oxygen, obstructive sleep apnea, type 2 diabetes mellitus, HIV infection, erectile dysfunction, morbid obesity and ongoing nicotine abuse here today for a follow up visit. He is complaining of cough productive of thick whitish sputum and some cold symptom. He denies fever, no chest pain. SOB is at baseline. He missed his appointment  For sleep study, he also missed his podiatrist appointment. He denies depression, he denies suicidal ideations or thoughts. He follows up regularly with pulmonologist and Infectious Disease specialist. He admits to drinking plenty of soda (pepsi) daily. He also shows indiscretion to his diet. He is unable to exercise because of chronic respiratory failure on oxygen. Patient has No headache, No chest pain, No abdominal pain - No Nausea, No new weakness tingling or numbness.   Problem  Acute Bronchitis    ALLERGIES: Allergies  Allergen Reactions  . Bactrim [Sulfamethoxazole-Trimethoprim] Hives    PAST MEDICAL HISTORY: Past Medical History:  Diagnosis Date  . COPD (chronic obstructive pulmonary disease) ([place])   . Depression   . Diabetes mellitus without complication ([place])   . Dyspnea   . Emphysema   . GERD (gastroesophageal reflux disease)   . HIV disease ([place])     MEDICATIONS AT HOME: Prior to Admission medications   Medication Sig Start Date End Date Taking? Authorizing Provider  ACCU-CHEK SOFTCLIX LANCETS lancets Use as instructed 08/26/16  Yes Carlyle Basques, MD  acetaminophen-codeine (TYLENOL #3) 300-30 MG tablet Take 1 tablet by mouth every 8 (eight) hours as needed for moderate pain. 11/12/16  Yes Tresa Garter, MD  albuterol (PROVENTIL HFA;VENTOLIN HFA) 108 (90 Base) MCG/ACT inhaler Inhale 2 puffs into the  lungs every 4 (four) hours as needed for wheezing or shortness of breath (((PLAN B))). 01/03/16  Yes Raistlin Gum E Doreene Burke, MD  Blood Glucose Monitoring Suppl (ACCU-CHEK AVIVA PLUS) W/DEVICE KIT 250 Test up to 4 times daily 04/28/14  Yes Tresa Garter, MD  budesonide-formoterol (SYMBICORT) 160-4.5 MCG/ACT inhaler Take 2 puffs first thing in am and then another 2 puffs about 12 hours later. 07/17/16  Yes Tresa Garter, MD  clotrimazole (LOTRIMIN) 1 % cream Apply 1 application topically 2 (two) times daily. 08/26/16  Yes Carlyle Basques, MD  elvitegravir-cobicistat-emtricitabine-tenofovir (GENVOYA) 150-150-200-10 MG TABS tablet Take 1 tablet by mouth daily with breakfast. 06/23/16  Yes Carlyle Basques, MD  EZ SMART BLOOD GLUCOSE LANCETS MISC 1 Units by Does not apply route 4 (four) times daily. 08/26/16  Yes Carlyle Basques, MD  glucose blood test strip Use as instructed 04/20/14  Yes Leda Bellefeuille E Doreene Burke, MD  insulin glargine (LANTUS) 100 UNIT/ML injection Inject 0.2 mLs (20 Units total) into the skin at bedtime. 09/17/16  Yes Tresa Garter, MD  Insulin Pen Needle 32G X 5 MM MISC Use as directed 06/12/14  Yes Hershall Benkert E Doreene Burke, MD  Insulin Syringe-Needle U-100 (BD INSULIN SYRINGE ULTRAFINE) 31G X 15/64" 0.5 ML MISC Use as directed 10/14/16  Yes Breeze Angell E Doreene Burke, MD  ipratropium-albuterol (DUONEB) 0.5-2.5 (3) MG/3ML SOLN Take 3 mLs by nebulization every 6 (six) hours as needed. Patient taking differently: Take 3 mLs by nebulization every 6 (six) hours as needed (shortness of breath).  01/24/16  Yes Arnoldo Morale, MD  metFORMIN (GLUCOPHAGE) 1000  MG tablet Take 1 tablet (1,000 mg total) by mouth 2 (two) times daily with a meal. 09/17/16  Yes Tresa Garter, MD  OXYGEN Place 2 L/min into the nose as needed.    Yes Historical Provider, MD  pantoprazole (PROTONIX) 40 MG tablet Take 1 tablet (40 mg total) by mouth 2 (two) times daily before a meal. 07/25/16  Yes Levin Erp, PA  sildenafil  (VIAGRA) 50 MG tablet Take 0.5 tablets (25 mg total) by mouth daily as needed for erectile dysfunction. 06/18/15  Yes Carlyle Basques, MD  tiotropium (SPIRIVA) 18 MCG inhalation capsule Place 1 capsule (18 mcg total) into inhaler and inhale daily. 10/14/16  Yes Tammy S Parrett, NP  doxycycline (VIBRA-TABS) 100 MG tablet Take 1 tablet (100 mg total) by mouth 2 (two) times daily. 11/12/16   Tresa Garter, MD  guaiFENesin-dextromethorphan (ROBITUSSIN DM) 100-10 MG/5ML syrup Take 5 mLs by mouth every 4 (four) hours as needed for cough. 11/12/16   Tresa Garter, MD    Objective:   Vitals:   11/12/16 0934  BP: 136/77  Pulse: 92  Resp: 20  Temp: 97.4 F (36.3 C)  TempSrc: Oral  SpO2: 91%  Weight: 265 lb (120.2 kg)  Height: '5\' 9"'  (1.753 m)   Exam General appearance : Awake, alert, not in any distress. Speech Clear. Not toxic looking, obese, on oxygen HEENT: Atraumatic and Normocephalic, pupils equally reactive to light and accomodation Neck: Supple, no JVD. No cervical lymphadenopathy.  Chest: Good air entry bilaterally, no added sounds  CVS: S1 S2 regular, no murmurs.  Abdomen: Bowel sounds present, Non tender and not distended with no gaurding, rigidity or rebound. Extremities: B/L Lower Ext shows no edema, both legs are warm to touch Neurology: Awake alert, and oriented X 3, CN II-XII intact, Non focal Skin: No Rash  Data Review Lab Results  Component Value Date   HGBA1C 9.3 11/12/2016   HGBA1C 9.4 07/17/2016   HGBA1C 7.8 03/27/2016   Assessment & Plan   1. Type 2 diabetes mellitus with complication, with long-term current use of insulin (HCC)  - Microalbumin/Creatinine Ratio, Urine - Glucose (CBG) - HgB A1c - Ambulatory referral to Ophthalmology: For routine diabetic eye exam  2. COPD  GOLD III, still smoking  - Follow up with Pulmonologist as scheduled - Continue inhalers - STOP Smoking  3. Obstructive sleep apnea syndrome  - Please re-schedule you sleep study  appointment and keep the appointment - Continue home oxygen at all time  4. Acute bronchitis, unspecified organism  - guaiFENesin-dextromethorphan (ROBITUSSIN DM) 100-10 MG/5ML syrup; Take 5 mLs by mouth every 4 (four) hours as needed for cough.  Dispense: 480 mL; Refill: 0  5. Primary osteoarthritis of right knee  - acetaminophen-codeine (TYLENOL #3) 300-30 MG tablet; Take 1 tablet by mouth every 8 (eight) hours as needed for moderate pain.  Dispense: 90 tablet; Refill: 0  Patient have been counseled extensively about nutrition and exercise. Other issues discussed during this visit include: low cholesterol diet, weight control and daily exercise, foot care, annual eye examinations at Ophthalmology, importance of adherence with medications and regular follow-up. We also discussed long term complications of uncontrolled diabetes and hypertension.   Return in about 3 months (around 02/11/2017) for Hemoglobin A1C and Follow up, DM, Follow up HTN, Follow up Pain and comorbidities, COPD.  The patient was given clear instructions to go to ER or return to medical center if symptoms don't improve, worsen or new problems develop. The patient  verbalized understanding. The patient was told to call to get lab results if they haven't heard anything in the next week.   This note has been created with Surveyor, quantity. Any transcriptional errors are unintentional.    Angelica Chessman, MD, [place], Karilyn Cota, [place] and Orlinda [place], [place]   11/12/2016, 9:45 AM

## 2016-11-12 NOTE — Progress Notes (Signed)
poPatient is here for Cold SX  Patient complains of thick mucus being present and increased in the morning. Patient complains of back pain and bilateral knee pain. Pain is scaled currently at an 8 and described as aching.  Patient has taken medication today. Patient has eaten today.

## 2016-11-13 ENCOUNTER — Encounter: Payer: Self-pay | Admitting: Internal Medicine

## 2016-11-13 ENCOUNTER — Ambulatory Visit (INDEPENDENT_AMBULATORY_CARE_PROVIDER_SITE_OTHER): Payer: Medicare Other | Admitting: Internal Medicine

## 2016-11-13 VITALS — BP 124/82 | HR 89 | Temp 98.1°F | Wt 266.0 lb

## 2016-11-13 DIAGNOSIS — E118 Type 2 diabetes mellitus with unspecified complications: Secondary | ICD-10-CM

## 2016-11-13 DIAGNOSIS — Z794 Long term (current) use of insulin: Secondary | ICD-10-CM | POA: Diagnosis not present

## 2016-11-13 DIAGNOSIS — Z23 Encounter for immunization: Secondary | ICD-10-CM

## 2016-11-13 DIAGNOSIS — B20 Human immunodeficiency virus [HIV] disease: Secondary | ICD-10-CM

## 2016-11-13 LAB — COMPLETE METABOLIC PANEL WITH GFR
ALT: 14 U/L (ref 9–46)
AST: 10 U/L (ref 10–35)
Albumin: 4.1 g/dL (ref 3.6–5.1)
Alkaline Phosphatase: 45 U/L (ref 40–115)
BUN: 11 mg/dL (ref 7–25)
CALCIUM: 9.3 mg/dL (ref 8.6–10.3)
CO2: 28 mmol/L (ref 20–31)
Chloride: 102 mmol/L (ref 98–110)
Creat: 0.9 mg/dL (ref 0.70–1.33)
GFR, Est Non African American: >89 mL/min (ref >=60)
Glucose, Bld: 201 mg/dL — ABNORMAL HIGH (ref 65–99)
Potassium: 4.4 mmol/L (ref 3.5–5.3)
Sodium: 140 mmol/L (ref 135–146)
TOTAL PROTEIN: 8 g/dL (ref 6.1–8.1)
Total Bilirubin: 0.3 mg/dL (ref 0.2–1.2)

## 2016-11-13 LAB — CBC WITH DIFFERENTIAL/PLATELET
BASOS PCT: 0 %
Basophils Absolute: 0 cells/uL (ref 0–200)
EOS ABS: 172 {cells}/uL (ref 15–500)
Eosinophils Relative: 2 %
HEMATOCRIT: 46.8 % (ref 38.5–50.0)
HEMOGLOBIN: 15 g/dL (ref 13.2–17.1)
LYMPHS ABS: 2924 {cells}/uL (ref 850–3900)
Lymphocytes Relative: 34 %
MCH: 29.8 pg (ref 27.0–33.0)
MCHC: 32.1 g/dL (ref 32.0–36.0)
MCV: 93 fL (ref 80.0–100.0)
MONO ABS: 946 {cells}/uL (ref 200–950)
MPV: 10.6 fL (ref 7.5–12.5)
Monocytes Relative: 11 %
NEUTROS ABS: 4558 {cells}/uL (ref 1500–7800)
Neutrophils Relative %: 53 %
PLATELETS: 208 10*3/uL (ref 140–400)
RBC: 5.03 MIL/uL (ref 4.20–5.80)
RDW: 12.8 % (ref 11.0–15.0)
WBC: 8.6 10*3/uL (ref 3.8–10.8)

## 2016-11-13 LAB — MICROALBUMIN / CREATININE URINE RATIO
Creatinine, Urine: 69.5 mg/dL
Microalb/Creat Ratio: 75.7 mg/g creat — ABNORMAL HIGH (ref 0.0–30.0)
Microalbumin, Urine: 52.6 ug/mL

## 2016-11-13 NOTE — Progress Notes (Signed)
Patient ID: Chris Round., male   DOB: Oct 07, 1965, 51 y.o.   MRN: [id number]  HPI Chris [name] a 51yo M with HIV disease, CD 4 count of 310/VL<20 (oct) Doing well on genvoya except still having high BS, poor management of DM. He states that he is having difficulty cutting back on sodas and other dietary restrictions. He has not had any recent exacerbations of copd. Continues to wear supplemental o2. Outpatient Encounter Prescriptions as of 11/13/2016  Medication Sig  . ACCU-CHEK SOFTCLIX LANCETS lancets Use as instructed  . acetaminophen-codeine (TYLENOL #3) 300-30 MG tablet Take 1 tablet by mouth every 8 (eight) hours as needed for moderate pain.  [name] albuterol (PROVENTIL HFA;VENTOLIN HFA) 108 (90 Base) MCG/ACT inhaler Inhale 2 puffs into the lungs every 4 (four) hours as needed for wheezing or shortness of breath (((PLAN B))).  . Blood Glucose Monitoring Suppl (ACCU-CHEK AVIVA PLUS) W/DEVICE KIT 250 Test up to 4 times daily  . budesonide-formoterol (SYMBICORT) 160-4.5 MCG/ACT inhaler Take 2 puffs first thing in am and then another 2 puffs about 12 hours later.  . clotrimazole (LOTRIMIN) 1 % cream Apply 1 application topically 2 (two) times daily.  [name] doxycycline (VIBRA-TABS) 100 MG tablet Take 1 tablet (100 mg total) by mouth 2 (two) times daily.  [name] elvitegravir-cobicistat-emtricitabine-tenofovir (GENVOYA) 150-150-200-10 MG TABS tablet Take 1 tablet by mouth daily with breakfast.  . EZ SMART BLOOD GLUCOSE LANCETS MISC 1 Units by Does not apply route 4 (four) times daily.  [name] glucose blood test strip Use as instructed  . guaiFENesin-dextromethorphan (ROBITUSSIN DM) 100-10 MG/5ML syrup Take 5 mLs by mouth every 4 (four) hours as needed for cough.  . insulin glargine (LANTUS) 100 UNIT/ML injection Inject 0.2 mLs (20 Units total) into the skin at bedtime.  . Insulin Pen Needle 32G X 5 MM MISC Use as directed  . Insulin Syringe-Needle U-100 (BD INSULIN SYRINGE ULTRAFINE) 31G X 15/64" 0.5 ML MISC  Use as directed  . ipratropium-albuterol (DUONEB) 0.5-2.5 (3) MG/3ML SOLN Take 3 mLs by nebulization every 6 (six) hours as needed. (Patient taking differently: Take 3 mLs by nebulization every 6 (six) hours as needed (shortness of breath). )  . metFORMIN (GLUCOPHAGE) 1000 MG tablet Take 1 tablet (1,000 mg total) by mouth 2 (two) times daily with a meal.  . OXYGEN Place 2 L/min into the nose as needed.   . pantoprazole (PROTONIX) 40 MG tablet Take 1 tablet (40 mg total) by mouth 2 (two) times daily before a meal.  . sildenafil (VIAGRA) 50 MG tablet Take 0.5 tablets (25 mg total) by mouth daily as needed for erectile dysfunction.  [name] tiotropium (SPIRIVA) 18 MCG inhalation capsule Place 1 capsule (18 mcg total) into inhaler and inhale daily.   No facility-administered encounter medications on file as of 11/13/2016.      Patient Active Problem List   Diagnosis Date Noted  . Acute bronchitis 11/12/2016  . Special screening for malignant neoplasms, colon   . Dysphagia   . Candida esophagitis ([place])   . Abdominal pain, epigastric 07/17/2016  . Candidiasis of mouth 03/27/2016  . Adjustment disorder with depressed mood 01/04/2016  . Callus of foot 01/04/2016  . Hoarseness of voice 10/04/2015  . AIDS (acquired immunodeficiency syndrome) ([place]) 10/04/2015  . Type 2 diabetes mellitus with complication, with long-term current use of insulin ([place]) 10/04/2015  . Cigarette nicotine dependence without complication 38/45/3646  . Morbid (severe) obesity due to excess calories ([place]) 09/04/2015  . Primary osteoarthritis  of right knee 03/26/2015  . Genital warts 01/22/2015  . Other male erectile dysfunction 01/22/2015  . Type 2 diabetes mellitus with complication ([place]) 09/21/1733  . Essential hypertension, benign 12/04/2014  . Atopic eczema 12/04/2014  . Cyst (solitary) of breast 08/23/2013  . Breast abscess 08/23/2013  . Obstructive sleep apnea syndrome 08/23/2013  . Cigarette smoker 02/17/2013  .  Hyperglycemia 02/17/2013  . Uncontrolled diabetes mellitus ([place]) 02/17/2013  . DM (diabetes mellitus) ([place]) 01/27/2013  . Steroid-induced hyperglycemia 01/12/2013  . Chronic respiratory failure with hypoxia and hypercapnia ([place]) 01/12/2013  . COPD  GOLD III, still smoking 01/11/2013  . Nicotine abuse 01/11/2013  . HIV disease ([place]) 12/06/2012     Health Maintenance Due  Topic Date Due  . FOOT EXAM  04/12/1976  . OPHTHALMOLOGY EXAM  04/12/1976  . TETANUS/TDAP  04/12/1985     Review of Systems 12 point ros is negative Physical Exam   BP 124/82   Pulse 89   Temp 98.1 F (36.7 C) (Oral)   Wt 266 lb (120.7 kg)   BMI 39.28 kg/m   .idepm  Lab Results  Component Value Date   CD4TCELL 12 (L) 06/23/2016   Lab Results  Component Value Date   CD4TABS 310 (L) 06/23/2016   CD4TABS 230 (L) 09/27/2015   CD4TABS 90 (L) 08/14/2015   Lab Results  Component Value Date   HIV1RNAQUANT <20 06/23/2016   Lab Results  Component Value Date   HEPBSAB REACTIVE (A) 12/06/2012   Lab Results  Component Value Date   LABRPR NON REAC 06/23/2016    CBC Lab Results  Component Value Date   WBC 6.7 08/07/2016   RBC 5.34 08/07/2016   HGB 15.7 08/07/2016   HCT 48.8 08/07/2016   PLT 180 08/07/2016   MCV 91.4 08/07/2016   MCH 29.4 08/07/2016   MCHC 32.2 08/07/2016   RDW 12.2 08/07/2016   LYMPHSABS 2.2 08/07/2016   MONOABS 0.7 08/07/2016   EOSABS 0.1 08/07/2016    BMET Lab Results  Component Value Date   NA 133 (L) 08/07/2016   K 3.9 08/07/2016   CL 96 (L) 08/07/2016   CO2 27 08/07/2016   GLUCOSE 426 (H) 08/07/2016   BUN 9 08/07/2016   CREATININE 0.73 08/07/2016   CALCIUM 8.8 (L) 08/07/2016   GFRNONAA >60 08/07/2016   GFRAA >60 08/07/2016      Assessment and Plan  HIV disease= will continue with genvoya. Plan to  do blood work to see that it is still controlled  DM = on review of labs, he has BS >200- 300s range. He needs better management as this will shorly place  more risk for health issues. We recommend that he follows up with PCP, referral to endocrine.  Health maintenance = will give meningococcal vaccine

## 2016-11-14 LAB — T-HELPER CELL (CD4) - (RCID CLINIC ONLY)
CD4 T CELL ABS: 370 /uL — AB (ref 400–2700)
CD4 T CELL HELPER: 13 % — AB (ref 33–55)

## 2016-11-17 LAB — HIV-1 RNA QUANT-NO REFLEX-BLD
HIV 1 RNA Quant: <20 copies/mL — AB
HIV-1 RNA QUANT, LOG: DETECTED {Log_copies}/mL — AB

## 2016-11-17 NOTE — Telephone Encounter (Signed)
Spoke with representative from [place] [phone]) who stated the patient's plan does not require a PA for Symbicort. [facility] [phone]) and informed them of lack of PA needed to fill medication. Pt contacted and made aware PA is not needed for Symbicort. Nothing further is needed.

## 2016-11-18 ENCOUNTER — Ambulatory Visit: Payer: Medicare Other | Admitting: Internal Medicine

## 2016-11-25 ENCOUNTER — Ambulatory Visit: Payer: Medicare Other | Admitting: Internal Medicine

## 2016-12-09 ENCOUNTER — Telehealth: Payer: Self-pay | Admitting: Internal Medicine

## 2016-12-09 NOTE — Telephone Encounter (Signed)
Patient called the office to request medication refill for acetaminophen-codeine (TYLENOL #3) 300-30 MG tablet. ° °Thank you.  °

## 2016-12-10 ENCOUNTER — Other Ambulatory Visit: Payer: Self-pay | Admitting: Internal Medicine

## 2016-12-10 DIAGNOSIS — M1711 Unilateral primary osteoarthritis, right knee: Secondary | ICD-10-CM

## 2016-12-10 MED ORDER — ACETAMINOPHEN-CODEINE #3 300-30 MG PO TABS: 1.0000 | ORAL_TABLET | Freq: Three times a day (TID) | ORAL | 0 refills | Status: DC | PRN

## 2016-12-11 ENCOUNTER — Ambulatory Visit (INDEPENDENT_AMBULATORY_CARE_PROVIDER_SITE_OTHER): Payer: Medicare Other | Admitting: Internal Medicine

## 2016-12-11 ENCOUNTER — Encounter: Payer: Self-pay | Admitting: Internal Medicine

## 2016-12-11 VITALS — BP 130/84 | HR 80 | Ht 69.0 in | Wt 270.0 lb

## 2016-12-11 DIAGNOSIS — J449 Chronic obstructive pulmonary disease, unspecified: Secondary | ICD-10-CM

## 2016-12-11 DIAGNOSIS — J9611 Chronic respiratory failure with hypoxia: Secondary | ICD-10-CM

## 2016-12-11 DIAGNOSIS — J9612 Chronic respiratory failure with hypercapnia: Secondary | ICD-10-CM

## 2016-12-11 DIAGNOSIS — F17210 Nicotine dependence, cigarettes, uncomplicated: Secondary | ICD-10-CM

## 2016-12-11 MED ORDER — TIOTROPIUM BROMIDE MONOHYDRATE 2.5 MCG/ACT IN AERS: 2.0000 | INHALATION_SPRAY | Freq: Every day | RESPIRATORY_TRACT | 0 refills | Status: DC

## 2016-12-11 NOTE — Progress Notes (Signed)
Subjective:    Patient ID: Chris [name], male    DOB: [birth year]   MRN: [id number]    Brief patient profile:  50 yobm active smoker with HIV  and doe x 2009 much worse since March 2014 so referred by [name] to pulmonary clinic 02/15/2013 with GOLD III copd by pfts 04/28/13.   History of Present Illness  02/15/2013 1st pulmonary eval  Cc indolent onset progressive doe x walking slow pace more than 100 ft,  not at rest., 02 dep, doe much worse x 3 months assoc with congested cough esp in am with sev tbsp thick white mucus takes about 30 min to clear.  No better on inhalers tried to date including advair and spiriva - albuterol works the best and using lots of saba and nebs rec Stop advair and spiriva Plan A = Automatic = Start symbicort 160 Take 2 puffs first thing in am and then another 2 puffs about 12 hours later.  Plan B = backup= Only use your albuterol (as a rescue medication to be used if you can't catch your breath by resting or doing a relaxed purse lip breathing pattern. The less you use it, the better it will work when you need it. Ok to use up to 2 puffs every 4 hours Plan C = nebulizer, use this only if plan B doesn't work ok to use up to 4 hours     04/28/2013 f/u ov/Chris [name] still smoking  re:  GOLD III/ 02 dep @ 2lpm  Chief Complaint  Patient presents with  . Follow-up    Pt states that his breathing has improved, symbicort seems to be helping some. No new co's today. Using proair approx 4 times per wk.   still doe but has learned to pace himself and overall better but somewhat limited from desired activities even on his best days rec Plan A = Automatic =  symbicort 160 Take 2 puffs first thing in am and then another 2 puffs about 12 hours later.  Add spiriva one capsule daily each am Plan B = backup= Only use your albuterol (proiare) as a rescue medication to be used if you can't catch your breath by resting or doing a relaxed purse lip breathing pattern. The less you use it, the  better it will work when you need it. Ok to use up to 2 puffs every 4 hours Plan C = nebulizer, use this only if plan B doesn't work after 15 min ok to use up to 4 hours   03/14/2014 Follow up and Medication review   rec Use medical med calendar        09/25/2016  Extended summary/ final f/u ov/Chris [name] re:  GOLD  III copd/ 02 2lpm prn / symb 160 2bid/spiriva  Chief Complaint  Patient presents with  . Follow-up    Breathing seems to be improving. He is using albuterol inhaler once per wk on average.   doe = MMRC2 = can't walk a nl pace on a flat grade s sob but does fine slow and flat eg does HT shopping ok on 2lpm  Rare need for saba hfa/ hardly ever needs neb now rec Plan A = Automatic =  symbicort 160 Take 2 puffs first thing in am and then another 2 puffs about 12 hours later.  Incruse one click each am - take two drags to get it all  Plan B = Backup Only use your albuterol as a rescue medication Plan C = Crisis - only use your albuterol nebulizer if you first try Plan B The key is to stop smoking completely before smoking completely stops you!    10/14/16   NP ov Rec Stop INCRUSE  Begin Spiriva Handihaler 1 puff daily .  Continue on Symbicort Twice daily. , brush rinse and gargle after use.  Important to quit smoking  Chest xray today .  Doxycycline 100mg  Twice daily  For 1 week , take with food.  Zyrtec 10mg  At bedtime  As needed  Drainage  Mucinex DM Twice daily As needed  Cough/congestion  Continue on O2 2l/m .     12/11/2016  f/u ov/Chris [name] re:  COPD  GOLD III/ symb / spiriva dpiand 02 2- lpm and still smoking Chief Complaint  Patient presents with  . Follow-up    Pt states his breathing seems worse on Incruse so he has started back on spiriva b/c he had some left over. He is using albuterol inhaler 2 x daily on average and neb 3 x per wk on average.   doe = MMRC2 = can't walk a nl pace on a flat grade s sob but does fine slow and flat  eg [employer] on  RA  No obvious day to day or daytime variability or assoc excess/ purulent sputum or mucus plugs or hemoptysis or cp or chest tightness, subjective wheeze or overt sinus or hb symptoms. No unusual exp hx or h/o childhood pna/ asthma or knowledge of premature birth.  Sleeping ok without nocturnal  or early am exacerbation  of respiratory  c/o's or need for noct saba. Also denies any obvious fluctuation of symptoms with weather or environmental changes or other aggravating or alleviating factors except as outlined above   Current Medications, Allergies, Complete Past Medical History, Past Surgical History, Family History, and Social History were reviewed in [employer] record.  ROS  The following are not active complaints unless bolded sore throat, dysphagia, dental problems, itching, sneezing,  nasal congestion or excess/ purulent secretions, ear ache,   fever, chills, sweats, unintended wt loss, classically pleuritic or exertional cp,  orthopnea pnd or leg swelling, presyncope, palpitations, abdominal pain, anorexia, nausea, vomiting, diarrhea  or change in bowel or bladder habits, change in stools or urine, dysuria,hematuria,  rash, arthralgias, visual complaints, headache, numbness, weakness or ataxia or problems with walking or coordination,  change in mood/affect or memory.                      Objective:   Physical Exam   amb obese bm nad / vital signs reviewed  - Note on arrival 02 sats  98% on 2lpm    01/05/2015       278 >  02/29/2016  274 > 06/17/2016 277 > 09/25/2016   258  > 12/11/2016   270  01/28/2016        272   05/16/14 285 lb 6.4 oz (129.457 kg)  04/20/14 282 lb 12.8 oz (128.277 kg)  03/14/14 274 lb 3.2 oz (124.376 kg)     HEENT: nl dentition, turbinates bilaterally, and oropharynx. Nl external ear canals without cough reflex   NECK :  without JVD/Nodes/TM/ nl carotid upstrokes bilaterally   LUNGS: no acc muscle use,  Minimal insp and  exp rhonchi bilaterally s cough on  insp or exp    CV:  RRR  no s3 or murmur or increase in P2, and no edema   ABD: quite obese  soft and nontender with nl inspiratory excursion in the supine position. No bruits or organomegaly appreciated, bowel sounds nl  MS:  Nl gait/ ext warm without deformities, calf tenderness, cyanosis or clubbing No obvious joint restrictions   SKIN: warm and dry without lesions    NEURO:  alert, approp, nl sensorium with  no motor or cerebellar deficits apparent.              Assessment & Plan:

## 2016-12-11 NOTE — Patient Instructions (Addendum)
Change to spiriva respimat 2 pffs each am   The key is to stop smoking completely before smoking completely stops you!   Weight control is simply a matter of calorie balance which needs to be tilted in your favor by eating less and exercising more.  To get the most out of exercise, you need to be continuously aware that you are short of breath, but never out of breath, for 30 minutes daily. As you improve, it will actually be easier for you to do the same amount of exercise  in  30 minutes so always push to the level where you are short of breath while on 02 up to 3lpm   See Tammy NP w/in 2 weeks  With a list of inhaler alternatives (drug formular)

## 2016-12-14 NOTE — Assessment & Plan Note (Signed)

## 2016-12-14 NOTE — Assessment & Plan Note (Signed)
Body mass index is 39.87 kg/m.  -  trending up Lab Results  Component Value Date   TSH 1.507 08/21/2015     Contributing to gerd risk/ doe/reviewed the need and the process to achieve and maintain neg calorie balance > defer f/u primary care including intermittently monitoring thyroid status

## 2016-12-14 NOTE — Assessment & Plan Note (Signed)
-   04/28/2013 PFT's FEV1 1.21 (39%) ratio 59 and 13% better p B2 and dlco 72 corrects to 102  - started spiriva 04/30/2013 > changed to respimat 02/28/2014 -Med calendar 03/14/2014 > not using as of 05/16/14  - arrived on stiolto / symbicort 02/29/2016 > changed to symbicort 80/spiriva (lower dose ICS due to interaction with aids meds  - PFT's  06/17/2016  FEV1 1.29 (42 % ) ratio 64  p 43 % improvement from saba p symb 80 /spiriva prior to study with DLCO  51/49c % corrects to 86  % for alv volume   - 06/17/2016   change symb to 160 2bid  - 09/25/2016  After extensive coaching device  effectiveness =    90% with dpi/ elipta > changed to Incruse per Insurance restrictions > preferred spiriva - 12/11/2016  After extensive coaching device effectiveness =    90% with SMI > changed to spiriva respimat  Moderately severe dz and still smoking/ issues with formularies noted  I had an extended discussion with the patient reviewing all relevant studies completed to date and  lasting 15 to 20 minutes of a 25 minute visit on the following ongoing concerns:  1) Formulary restrictions will be an ongoing challenge for the forseable future and I would be happy to pick an alternative if the pt will first  provide me a list of them but pt  will need to return here for training for any new device that is required eg dpi vs hfa vs respimat.    In meantime we can always provide samples so the patient never runs out of any needed respiratory medications.   Samples of spiriva respimat given  2) Must return with all meds in hand using a trust but verify approach to confirm accurate Medication  Reconciliation The principal here is that until we are certain that the  patients are doing what we've asked, it makes no sense to ask them to do more.   3) Each maintenance medication was reviewed in detail including most importantly the difference between maintenance and as needed and under what circumstances the prns are to be used.  Please  see AVS for specific  Instructions which are unique to this visit and I personally typed out  which were reviewed in detail in writing with the patient and a copy provided.

## 2016-12-14 NOTE — Assessment & Plan Note (Signed)
-    HCO3 31 01/17/16 - 01/05/2015  Walked RA x 3 laps @ 185 ft each stopped due to  Leg pain and fatibue, no sob , nl pace, no desat  -  12/11/2016 Patient Saturations on Room Air at Rest = 88%----increased 98% 2lpm continuous  rec as of 12/14/2016  = 2lpm 24/7

## 2016-12-25 ENCOUNTER — Telehealth: Payer: Self-pay | Admitting: Internal Medicine

## 2016-12-25 DIAGNOSIS — J449 Chronic obstructive pulmonary disease, unspecified: Secondary | ICD-10-CM

## 2016-12-25 MED ORDER — BUDESONIDE-FORMOTEROL FUMARATE 160-4.5 MCG/ACT IN AERO: INHALATION_SPRAY | RESPIRATORY_TRACT | 11 refills | Status: DC

## 2016-12-25 NOTE — Telephone Encounter (Signed)
Pt requesting refill on symbicort to [facility] on [address].  This has been sent.  Nothing further needed.

## 2016-12-30 ENCOUNTER — Ambulatory Visit: Payer: Medicare Other | Admitting: Adult Health

## 2017-01-07 ENCOUNTER — Telehealth: Payer: Self-pay | Admitting: Internal Medicine

## 2017-01-07 NOTE — Telephone Encounter (Signed)
incruse one puff each am but be sure has ov before refills it to be sure it's right for him, either with me or NP's

## 2017-01-07 NOTE — Telephone Encounter (Signed)
Called and spoke with pt and he stated that he has tried the incruse and this did not help him at all.  Can he try one of the other medications with the symbicort?  Thanks

## 2017-01-07 NOTE — Telephone Encounter (Signed)
Spoke with the pt and notified of recs per MW  He did not want to try the atrovent b/c he still has some spiriva left over  Will keep ov 6/7 with SG to discuss more  Nothing further needed at this time

## 2017-01-07 NOTE — Telephone Encounter (Signed)
atrovent hfa 2 qid - again needs ov before refills it in a month

## 2017-01-07 NOTE — Telephone Encounter (Signed)
Patient returning call, CB is [phone].

## 2017-01-07 NOTE — Telephone Encounter (Signed)
Called and spoke with pharmacy and they stated that the spiriva is not a preferred medication.  Covered alternatives are:  atrovent HFA incruse combivent  MW please advise if the spiriva may be changed to one of the above medications.  Thanks

## 2017-01-07 NOTE — Telephone Encounter (Signed)
LMTCB for the pt 

## 2017-01-09 ENCOUNTER — Telehealth: Payer: Self-pay | Admitting: Internal Medicine

## 2017-01-09 NOTE — Telephone Encounter (Signed)
Pt came in requesting a refill of Tylenol #3. Requests CMA to call when script is ready for pickup. Thank you.

## 2017-01-12 ENCOUNTER — Other Ambulatory Visit: Payer: Self-pay | Admitting: Internal Medicine

## 2017-01-12 DIAGNOSIS — M1711 Unilateral primary osteoarthritis, right knee: Secondary | ICD-10-CM

## 2017-01-12 MED ORDER — ACETAMINOPHEN-CODEINE #3 300-30 MG PO TABS: 1.0000 | ORAL_TABLET | Freq: Three times a day (TID) | ORAL | 0 refills | Status: DC | PRN

## 2017-01-12 NOTE — Addendum Note (Signed)
Addendum  created 01/12/17 1020 by Malori Myers, MD   Sign clinical note    

## 2017-01-12 NOTE — Telephone Encounter (Signed)
Pt. Called requesting a refill on Tylenol # 3. Please f/u with pt.  °

## 2017-01-15 ENCOUNTER — Ambulatory Visit (INDEPENDENT_AMBULATORY_CARE_PROVIDER_SITE_OTHER): Payer: Medicare Other | Admitting: Acute Care

## 2017-01-15 ENCOUNTER — Encounter: Payer: Self-pay | Admitting: Acute Care

## 2017-01-15 DIAGNOSIS — F17210 Nicotine dependence, cigarettes, uncomplicated: Secondary | ICD-10-CM | POA: Diagnosis not present

## 2017-01-15 DIAGNOSIS — B3781 Candidal esophagitis: Secondary | ICD-10-CM

## 2017-01-15 DIAGNOSIS — J449 Chronic obstructive pulmonary disease, unspecified: Secondary | ICD-10-CM | POA: Diagnosis not present

## 2017-01-15 DIAGNOSIS — N611 Abscess of the breast and nipple: Secondary | ICD-10-CM

## 2017-01-15 MED ORDER — MAGIC MOUTHWASH: 5.0000 mL | Freq: Four times a day (QID) | ORAL | 0 refills | Status: DC

## 2017-01-15 MED ORDER — TIOTROPIUM BROMIDE MONOHYDRATE 2.5 MCG/ACT IN AERS: 2.0000 | INHALATION_SPRAY | Freq: Every day | RESPIRATORY_TRACT | 0 refills | Status: DC

## 2017-01-15 NOTE — Progress Notes (Signed)
Chart and office note reviewed in detail  > agree with a/p as outlined    

## 2017-01-15 NOTE — Assessment & Plan Note (Signed)
Drainage from right nipple Patient with breast abscesses in the past Plan Refer to ID for follow-up with Dr. Ilsa IhaSnyder who he sees on a regular basis We discussed that he must be very proactive in addressing any potential infection with his immuno-compromised status

## 2017-01-15 NOTE — Progress Notes (Signed)
History of Present Illness Chris [name]. is a 51 y.o. male active smoker with HIV  and doe x 2009 much worse since March 2014 so referred by [name] to pulmonary clinic 02/15/2013 with GOLD III copd by pfts 04/28/13.    01/15/2017 Follow up : Patient was seen 12/11/2016 by [name]. At that time he was told to stop his in [place], which was no longer covered by his insurance, and to start Spiriva HandiHaler. Additional plan included continuing Symbicort, smoking cessation, continue use of Zyrtec and Mucinex for sinusitis, continue use of oxygen at 2 L, and weight loss to see if that would help his dyspnea on exertion. Pt presents today for follow up after trial of Spiriva, which he states worked well. His insurance does not cover the mediation however. He was asked to provide a formulary, and he states that he had one faxed, but we do not have it today in the office.Pt. Is wearing oxygen and is continuing to smoke.He states he is using the Zyrtec and Mucinex. He is wearing his oxygen today at 2 L [place]. He states he is coughing up white secretions that are thick. He is doing his DuoNebs twice a week.he states he is compliant with his Spiriva HandiHaler, and Symbicort. He states he is using his Zyrtec daily and using Mucinex or Mucinex for mucillary clearance clearance. He is compliant with his oxygen. He is continuing to smoke. He states he has not taken any measures to address weight loss at this present time. Additionally, He states he has some drainage from his right nipple.He states he has had infections in the past, which I'm assuming were mastitis.Chris KitchenHe has oral thrush. He states he has not been good about rinsing his mouth after using his inhaled corticosteroids. He denies fever, chest pain, orthopnea, or hemoptysis. He denies leg or calf pain or swelling.  Test Results:  CBC Latest Ref Rng & Units 11/13/2016 08/07/2016 06/23/2016  WBC 3.8 - 10.8 K/uL 8.6 6.7 6.8  Hemoglobin 13.2 - 17.1 g/dL 15.0  15.7 15.4  Hematocrit 38.5 - 50.0 % 46.8 48.8 48.5  Platelets 140 - 400 K/uL 208 180 205    BMP Latest Ref Rng & Units 11/13/2016 08/07/2016 06/23/2016  Glucose 65 - 99 mg/dL 201(H) 426(H) 309(H)  BUN 7 - 25 mg/dL _0 Creatinine 0.70 - 1.33 mg/dL 0.90 0.73 0.92  Sodium 135 - 146 mmol/L 140 133(L) 135  Potassium 3.5 - 5.3 mmol/L 4.4 3.9 4.7  Chloride 98 - 110 mmol/L 102 96(L) 98  CO2 20 - 31 mmol/L _1 Calcium 8.6 - 10.3 mg/dL 9.3 8.8(L) 9.4    BNP    Component Value Date/Time   BNP 21.5 08/14/2015 0537    ProBNP    Component Value Date/Time   PROBNP 43.1 12/12/2013 1006    PFT    Component Value Date/Time   FEV1PRE 0.90 06/17/2016 1155   FEV1POST 1.29 06/17/2016 1155   FVCPRE 1.60 06/17/2016 1155   FVCPOST 2.01 06/17/2016 1155   TLC 5.30 06/17/2016 1155   DLCOUNC 14.46 06/17/2016 1155   PREFEV1FVCRT 56 06/17/2016 1155   PSTFEV1FVCRT 64 06/17/2016 1155     Past medical hx Past Medical History:  Diagnosis Date  . COPD (chronic obstructive pulmonary disease) ([place])   . Depression   . Diabetes mellitus without complication ([place])   . Dyspnea   . Emphysema   . GERD (gastroesophageal reflux disease)   . HIV disease (  [facility])      Social History  Substance Use Topics  . Smoking status: Current Some Day Smoker    Packs/day: 0.25    Types: Cigarettes  . Smokeless tobacco: Never Used     Comment: Started smoking at age 6.  Currently smoking 3 cig per day. cautioned with O2 use  . Alcohol use No    Tobacco Cessation: Ready to quit: No Counseling given: Yes  I have spent 4 minutes counseling patient on smoking cessation and risks of continued tobacco abuse this visit.  Past surgical hx, Family hx, Social hx all reviewed.  Current Outpatient Prescriptions on File Prior to Visit  Medication Sig  . ACCU-CHEK SOFTCLIX LANCETS lancets Use as instructed  . acetaminophen-codeine (TYLENOL #3) 300-30 MG tablet Take 1 tablet by mouth every 8 (eight) hours as  needed for moderate pain.  Chris [name] albuterol (PROVENTIL HFA;VENTOLIN HFA) 108 (90 Base) MCG/ACT inhaler Inhale 2 puffs into the lungs every 4 (four) hours as needed for wheezing or shortness of breath (((PLAN B))).  . Blood Glucose Monitoring Suppl (ACCU-CHEK AVIVA PLUS) W/DEVICE KIT 250 Test up to 4 times daily  . budesonide-formoterol (SYMBICORT) 160-4.5 MCG/ACT inhaler Take 2 puffs first thing in am and then another 2 puffs about 12 hours later.  . clotrimazole (LOTRIMIN) 1 % cream Apply 1 application topically 2 (two) times daily.  Chris [name] elvitegravir-cobicistat-emtricitabine-tenofovir (GENVOYA) 150-150-200-10 MG TABS tablet Take 1 tablet by mouth daily with breakfast.  . EZ SMART BLOOD GLUCOSE LANCETS MISC 1 Units by Does not apply route 4 (four) times daily.  Chris [name] glucose blood test strip Use as instructed  . guaiFENesin-dextromethorphan (ROBITUSSIN DM) 100-10 MG/5ML syrup Take 5 mLs by mouth every 4 (four) hours as needed for cough.  . insulin glargine (LANTUS) 100 UNIT/ML injection Inject 0.2 mLs (20 Units total) into the skin at bedtime.  . Insulin Pen Needle 32G X 5 MM MISC Use as directed  . Insulin Syringe-Needle U-100 (BD INSULIN SYRINGE ULTRAFINE) 31G X 15/64" 0.5 ML MISC Use as directed  . ipratropium-albuterol (DUONEB) 0.5-2.5 (3) MG/3ML SOLN Take 3 mLs by nebulization every 6 (six) hours as needed. (Patient taking differently: Take 3 mLs by nebulization every 6 (six) hours as needed (shortness of breath). )  . metFORMIN (GLUCOPHAGE) 1000 MG tablet Take 1 tablet (1,000 mg total) by mouth 2 (two) times daily with a meal.  . OXYGEN Place 2 L/min into the nose as needed.   . pantoprazole (PROTONIX) 40 MG tablet Take 1 tablet (40 mg total) by mouth 2 (two) times daily before a meal.  . sildenafil (VIAGRA) 50 MG tablet Take 0.5 tablets (25 mg total) by mouth daily as needed for erectile dysfunction.   No current facility-administered medications on file prior to visit.      Allergies  Allergen  Reactions  . Bactrim [Sulfamethoxazole-Trimethoprim] Hives    Review Of Systems:  Constitutional:   No  weight loss, night sweats,  Fevers, chills, fatigue, or  lassitude.  HEENT:   No headaches,  Difficulty swallowing,  Tooth/dental problems, or  Sore throat,                No sneezing, itching, ear ache, nasal congestion, post nasal drip,   CV:  No chest pain,  Orthopnea, PND, swelling in lower extremities, anasarca, dizziness, palpitations, syncope.   GI  No heartburn, indigestion, abdominal pain, nausea, vomiting, diarrhea, change in bowel habits, loss of appetite, bloody stools.   Resp: + shortness of breath with  exertion or at rest.  + excess white mucus, + productive cough ( Baseline),  No non-productive cough,  No coughing up of blood.  No change in color of mucus. Occasional wheezing.  No chest wall deformity  Skin: no rash or lesions.  GU: no dysuria, change in color of urine, no urgency or frequency.  No flank pain, no hematuria   MS:  No joint pain or swelling.  No decreased range of motion.  No back pain.  Psych:  No change in mood or affect. No depression or anxiety.  No memory loss.   Vital Signs BP 130/70 (BP Location: Left Arm, Cuff Size: Normal)   Pulse 86   Ht _0  (1.753 m)   Wt 271 lb (122.9 kg)   SpO2 91%   BMI 40.02 kg/m    Physical Exam:  General- No distress,  A&Ox3, pleasant affect ENT: No sinus tenderness, TM clear, pale nasal mucosa, no oral exudate,no post nasal drip, no LAN Cardiac: S1, S2, regular rate and rhythm, no murmur Chest: No wheeze/ rales/ dullness; no accessory muscle use, no nasal flaring, no sternal retractions, diminished per bases bilaterally Abd.: Soft Non-tender, obese, soft palpation, bowel sounds positive Ext: No clubbing cyanosis, edema Neuro:  normal strength Skin: No rashes, warm and dry Psych: normal mood and behavior   Assessment/Plan  COPD  GOLD III, still smoking Good results was switched to Spiriva  HandiHaler Unfortunately this is also not covered by his insurance plan No formulary has arrived to the office Plan We will see if we can find the formulary from your insurance company. We will prescribe the drug equivalent that is better covered by your plan. We will see if we have any samples of the Spiriva Handihaler. Refer for appointment to ID today or tomorrow ( Dr. Graylon Good) for drainage from his right  nipple. Continue Symbicort 2 puffs twice daily. Remember to rinse after use. Follow up with [name] in 3 months Please contact office for sooner follow up if symptoms do not improve or worsen or seek emergency care     Candida esophagitis ([place]) Oral thrush Patient noncompliant with rinsing mouth after use of inhaled corticosteroids Plan Magic mouthwash 5 mL 4 times daily 7 days Extensive counseling on rinsing mouth after use of inhaled corticosteroids  Breast abscess Drainage from right nipple Patient with breast abscesses in the past Plan Refer to ID for follow-up with Dr. Graylon Good who he sees on a regular basis We discussed that he must be very proactive in addressing any potential infection with his immuno-compromised status    Magdalen Spatz, NP 01/15/2017  6:53 PM

## 2017-01-15 NOTE — Assessment & Plan Note (Signed)
Good results was switched to Spiriva HandiHaler Unfortunately this is also not covered by his insurance plan No formulary has arrived to the office Plan We will see if we can find the formulary from your insurance company. We will prescribe the drug equivalent that is better covered by your plan. We will see if we have any samples of the Spiriva Handihaler. Refer for appointment to ID today or tomorrow ( Dr. Ilsa IhaSnyder) for drainage from his right  nipple. Continue Symbicort 2 puffs twice daily. Remember to rinse after use. Follow up with Dr. Sherene SiresWert in 3 months Please contact office for sooner follow up if symptoms do not improve or worsen or seek emergency care

## 2017-01-15 NOTE — Patient Instructions (Addendum)
It is good to meet you today. We will see if we can find the formulary from your insurance company. We will prescribe the drug equivalent that is better covered by your plan. Please call the office if you have not heard from us regarding substitute medication We will see if we have any samples of the Spiriva Handihaler. Refer for appointment to ID today or tomorrow ( Dr. Ilsa IhaSnyder) for drainage from his right  nipple. Continue Symbicort 2 puffs twice daily. Remember to rinse after use. Magic Mouthwash 5 cc's 4 times daily x 7 days. Follow up with Dr. Sherene SiresWert in 3 months Please contact office for sooner follow up if symptoms do not improve or worsen or seek emergency care

## 2017-01-15 NOTE — Assessment & Plan Note (Signed)
Oral thrush Patient noncompliant with rinsing mouth after use of inhaled corticosteroids Plan Magic mouthwash 5 mL 4 times daily 7 days Extensive counseling on rinsing mouth after use of inhaled corticosteroids

## 2017-01-19 ENCOUNTER — Telehealth: Payer: Self-pay

## 2017-01-19 NOTE — Telephone Encounter (Signed)
Started PA on Spiriva Respimat 2.5 on Covermymeds through his Medicare Part D Plan. I called [employer] through his medicaid and they denied it and stated it had to be sent through Medicare. PA sent and awaiting response.

## 2017-01-23 NOTE — Telephone Encounter (Signed)
Received letter from Maralyn SagoSarah from [employer]. Spiriva was denied due to the patient being in enrolled into a Medicare Part D program.   [facility] with a $0 copay through Part D.   Nothing further needed at time of call.

## 2017-02-11 DIAGNOSIS — Z5321 Procedure and treatment not carried out due to patient leaving prior to being seen by health care provider: Secondary | ICD-10-CM | POA: Diagnosis not present

## 2017-02-11 DIAGNOSIS — J441 Chronic obstructive pulmonary disease with (acute) exacerbation: Secondary | ICD-10-CM | POA: Diagnosis not present

## 2017-02-11 DIAGNOSIS — Z21 Asymptomatic human immunodeficiency virus [HIV] infection status: Secondary | ICD-10-CM | POA: Diagnosis not present

## 2017-02-11 DIAGNOSIS — F17210 Nicotine dependence, cigarettes, uncomplicated: Secondary | ICD-10-CM | POA: Diagnosis not present

## 2017-02-11 DIAGNOSIS — J189 Pneumonia, unspecified organism: Secondary | ICD-10-CM | POA: Diagnosis not present

## 2017-02-11 DIAGNOSIS — R0902 Hypoxemia: Secondary | ICD-10-CM | POA: Diagnosis not present

## 2017-02-11 DIAGNOSIS — J449 Chronic obstructive pulmonary disease, unspecified: Secondary | ICD-10-CM | POA: Diagnosis not present

## 2017-02-11 DIAGNOSIS — E119 Type 2 diabetes mellitus without complications: Secondary | ICD-10-CM | POA: Diagnosis not present

## 2017-02-13 ENCOUNTER — Telehealth: Payer: Self-pay | Admitting: Internal Medicine

## 2017-02-13 NOTE — Telephone Encounter (Signed)
Patient is requesting refill for tylenol #3

## 2017-02-16 ENCOUNTER — Other Ambulatory Visit: Payer: Self-pay | Admitting: *Deleted

## 2017-02-16 DIAGNOSIS — M1711 Unilateral primary osteoarthritis, right knee: Secondary | ICD-10-CM

## 2017-02-16 MED ORDER — ACETAMINOPHEN-CODEINE #3 300-30 MG PO TABS: 1.0000 | ORAL_TABLET | Freq: Three times a day (TID) | ORAL | 0 refills | Status: DC | PRN

## 2017-02-16 NOTE — Telephone Encounter (Signed)
Last filled on 01/15/17

## 2017-02-16 NOTE — Telephone Encounter (Signed)
Pt. Called requesting a refill on Tylenol # 3. Please f/u with pt.  °

## 2017-02-18 ENCOUNTER — Encounter: Payer: Self-pay | Admitting: Internal Medicine

## 2017-02-18 ENCOUNTER — Ambulatory Visit: Payer: Medicare Other | Admitting: Internal Medicine

## 2017-02-18 ENCOUNTER — Ambulatory Visit: Payer: Medicare Other | Attending: Internal Medicine | Admitting: Internal Medicine

## 2017-02-18 VITALS — BP 109/69 | HR 79 | Temp 98.3°F | Resp 18 | Ht 69.0 in | Wt 265.6 lb

## 2017-02-18 DIAGNOSIS — Z794 Long term (current) use of insulin: Secondary | ICD-10-CM

## 2017-02-18 DIAGNOSIS — Z9981 Dependence on supplemental oxygen: Secondary | ICD-10-CM | POA: Diagnosis not present

## 2017-02-18 DIAGNOSIS — N529 Male erectile dysfunction, unspecified: Secondary | ICD-10-CM | POA: Diagnosis not present

## 2017-02-18 DIAGNOSIS — G4733 Obstructive sleep apnea (adult) (pediatric): Secondary | ICD-10-CM | POA: Diagnosis not present

## 2017-02-18 DIAGNOSIS — B20 Human immunodeficiency virus [HIV] disease: Secondary | ICD-10-CM | POA: Diagnosis not present

## 2017-02-18 DIAGNOSIS — J449 Chronic obstructive pulmonary disease, unspecified: Secondary | ICD-10-CM | POA: Insufficient documentation

## 2017-02-18 DIAGNOSIS — I10 Essential (primary) hypertension: Secondary | ICD-10-CM | POA: Insufficient documentation

## 2017-02-18 DIAGNOSIS — Z888 Allergy status to other drugs, medicaments and biological substances status: Secondary | ICD-10-CM | POA: Insufficient documentation

## 2017-02-18 DIAGNOSIS — Z79899 Other long term (current) drug therapy: Secondary | ICD-10-CM | POA: Insufficient documentation

## 2017-02-18 DIAGNOSIS — E118 Type 2 diabetes mellitus with unspecified complications: Secondary | ICD-10-CM | POA: Diagnosis not present

## 2017-02-18 LAB — POCT GLYCOSYLATED HEMOGLOBIN (HGB A1C): Hemoglobin A1C: 9.1

## 2017-02-18 LAB — GLUCOSE, POCT (MANUAL RESULT ENTRY): POC Glucose: 425 mg/dl — AB (ref 70–99)

## 2017-02-18 MED ORDER — INSULIN GLARGINE 100 UNIT/ML ~~LOC~~ SOLN
25.0000 [IU] | Freq: Every morning | SUBCUTANEOUS | 3 refills | Status: DC
Start: 2017-02-18 — End: 2017-07-28

## 2017-02-18 MED ORDER — ATORVASTATIN CALCIUM 20 MG PO TABS: 20.0000 mg | ORAL_TABLET | Freq: Every day | ORAL | 3 refills | Status: DC

## 2017-02-18 NOTE — Progress Notes (Signed)
Chris [name], is a 51 y.o. male  ZOX:[id number]  [id number]  DOB - Jul 17, 1966  Chief Complaint  Patient presents with  . Hypertension  . Diabetes      Subjective:   Chris [name] a 51 y.o. male with multiple medical history including COPD on home oxygen, obstructive sleep apnea, type 2 diabetes mellitus, HIV infection, erectile dysfunction, morbid obesity and ongoing nicotine abuse here today for a follow up visit. Has been on prednisone and antibiotics for COPD exacerbation for the past week and blood sugar has remained uncontrollable, mostly in the 400s since starting high dose prednisone. He has 4 more days left. He continues to drink soda. BP is controlled. He claims adherence with his medications, no hypoglycemic episode. Patient has No headache, No chest pain, No abdominal pain - No Nausea, No new weakness tingling or numbness, No Cough  ALLERGIES: Allergies  Allergen Reactions  . Bactrim [Sulfamethoxazole-Trimethoprim] Hives   PAST MEDICAL HISTORY: Past Medical History:  Diagnosis Date  . COPD (chronic obstructive pulmonary disease) ([place])   . Depression   . Diabetes mellitus without complication ([place])   . Dyspnea   . Emphysema   . GERD (gastroesophageal reflux disease)   . HIV disease ([place])    MEDICATIONS AT HOME: Prior to Admission medications   Medication Sig Start Date End Date Taking? Authorizing Provider  ACCU-CHEK SOFTCLIX LANCETS lancets Use as instructed 08/26/16   Carlyle Basques, MD  acetaminophen-codeine (TYLENOL #3) 300-30 MG tablet Take 1 tablet by mouth every 8 (eight) hours as needed for moderate pain. 02/16/17   Tresa Garter, MD  albuterol (PROVENTIL HFA;VENTOLIN HFA) 108 (90 Base) MCG/ACT inhaler Inhale 2 puffs into the lungs every 4 (four) hours as needed for wheezing or shortness of breath (((PLAN B))). 01/03/16   Tresa Garter, MD  atorvastatin (LIPITOR) 20 MG tablet Take 1 tablet (20 mg total) by mouth daily. 02/18/17   Tresa Garter, MD  Blood Glucose Monitoring Suppl (ACCU-CHEK AVIVA PLUS) W/DEVICE KIT 250 Test up to 4 times daily 04/28/14   Tresa Garter, MD  budesonide-formoterol [facility]) 160-4.5 MCG/ACT inhaler Take 2 puffs first thing in am and then another 2 puffs about 12 hours later. 12/25/16   Tanda Rockers, MD  clotrimazole (LOTRIMIN) 1 % cream Apply 1 application topically 2 (two) times daily. 08/26/16   Carlyle Basques, MD  elvitegravir-cobicistat-emtricitabine-tenofovir (GENVOYA) 150-150-200-10 MG TABS tablet Take 1 tablet by mouth daily with breakfast. 06/23/16   Carlyle Basques, MD  EZ SMART BLOOD GLUCOSE LANCETS MISC 1 Units by Does not apply route 4 (four) times daily. 08/26/16   Carlyle Basques, MD  glucose blood test strip Use as instructed 04/20/14   Tresa Garter, MD  guaiFENesin-dextromethorphan (ROBITUSSIN DM) 100-10 MG/5ML syrup Take 5 mLs by mouth every 4 (four) hours as needed for cough. 11/12/16   Tresa Garter, MD  insulin glargine (LANTUS) 100 UNIT/ML injection Inject 0.25 mLs (25 Units total) into the skin every morning. 02/18/17   Tresa Garter, MD  Insulin Pen Needle 32G X 5 MM MISC Use as directed 06/12/14   Tresa Garter, MD  Insulin Syringe-Needle U-100 (BD INSULIN SYRINGE ULTRAFINE) 31G X 15/64" 0.5 ML MISC Use as directed 10/14/16   Angelica Chessman E, MD  ipratropium-albuterol (DUONEB) 0.5-2.5 (3) MG/3ML SOLN Take 3 mLs by nebulization every 6 (six) hours as needed. Patient taking differently: Take 3 mLs by nebulization every 6 (six) hours as needed (shortness of breath).  01/24/16  Arnoldo Morale, MD  magic mouthwash SOLN Take 5 mLs by mouth 4 (four) times daily. 01/15/17   Magdalen Spatz, NP  metFORMIN (GLUCOPHAGE) 1000 MG tablet Take 1 tablet (1,000 mg total) by mouth 2 (two) times daily with a meal. 09/17/16   Tresa Garter, MD  OXYGEN Place 2 L/min into the nose as needed.     [provider]  pantoprazole (PROTONIX) 40 MG tablet  Take 1 tablet (40 mg total) by mouth 2 (two) times daily before a meal. 07/25/16   Lemmon, Lavone Nian, PA  sildenafil (VIAGRA) 50 MG tablet Take 0.5 tablets (25 mg total) by mouth daily as needed for erectile dysfunction. 06/18/15   Carlyle Basques, MD  Tiotropium Bromide Monohydrate (SPIRIVA RESPIMAT) 2.5 MCG/ACT AERS Inhale 2 puffs into the lungs daily. 01/15/17   Magdalen Spatz, NP    Objective:   Vitals:   02/18/17 0946  BP: 109/69  Pulse: 79  Resp: 18  Temp: 98.3 F (36.8 C)  TempSrc: Oral  SpO2: 90%  Weight: 265 lb 9.6 oz (120.5 kg)  Height: '5\' 9"'  (1.753 m)   Exam General appearance : Awake, alert, not in any distress. Speech Clear. Not toxic looking, obese. Oxygen by [place] in situ HEENT: Atraumatic and Normocephalic, pupils equally reactive to light and accomodation Neck: Supple, no JVD. No cervical lymphadenopathy.  Chest: Good air entry bilaterally, no added sounds  CVS: S1 S2 regular, no murmurs.  Abdomen: Bowel sounds present, Non tender and not distended with no gaurding, rigidity or rebound. Extremities: B/L Lower Ext shows no edema, both legs are warm to touch Neurology: Awake alert, and oriented X 3, CN II-XII intact, Non focal Skin: No Rash  Data Review Lab Results  Component Value Date   HGBA1C 9.1 02/18/2017   HGBA1C 9.3 11/12/2016   HGBA1C 9.4 07/17/2016    Assessment & Plan   1. Type 2 diabetes mellitus with complication, with long-term current use of insulin (HCC)  - POCT A1C is 9.1% today - Glucose (CBG) - insulin glargine (LANTUS) 100 UNIT/ML injection; Inject 0.25 mLs (25 Units total) into the skin every morning.  Dispense: 10 mL; Refill: 3 - atorvastatin (LIPITOR) 20 MG tablet; Take 1 tablet (20 mg total) by mouth daily.  Dispense: 90 tablet; Refill: 3  2. Essential hypertension, benign  We have discussed target BP range and blood pressure goal. I have advised patient to check BP regularly and to call us back or report to clinic if the numbers  are consistently higher than 140/90. We discussed the importance of compliance with medical therapy and DASH diet recommended, consequences of uncontrolled hypertension discussed.  - continue current BP medications  3. COPD  GOLD III, still smoking  Imri was counseled on the dangers of tobacco use, and was advised to quit. Reviewed strategies to maximize success, including removing cigarettes and smoking materials from environment, stress management and support of family/friends.  - Follow up with Pulmonologist as scheduled  Patient have been counseled extensively about nutrition and exercise. Other issues discussed during this visit include: low cholesterol diet, weight control and daily exercise, foot care, annual eye examinations at Ophthalmology, importance of adherence with medications and regular follow-up. We also discussed long term complications of uncontrolled diabetes and hypertension.   Return in about 3 months (around 05/21/2017) for Hemoglobin A1C and Follow up, DM, Follow up HTN, Follow up Pain and comorbidities, COPD.  The patient was given clear instructions to go to ER or return to  medical center if symptoms don't improve, worsen or new problems develop. The patient verbalized understanding. The patient was told to call to get lab results if they haven't heard anything in the next week.   This note has been created with Surveyor, quantity. Any transcriptional errors are unintentional.    Angelica Chessman, MD, [place], Karilyn Cota, [place] and [facility] [place], [place]   02/18/2017, 10:35 AM

## 2017-02-18 NOTE — Progress Notes (Signed)
Patient feels that the insulin isn't working.

## 2017-02-18 NOTE — Patient Instructions (Signed)
Diabetes and Foot Care Diabetes may cause you to have problems because of poor blood supply (circulation) to your feet and legs. This may cause the skin on your feet to become thinner, break easier, and heal more slowly. Your skin may become dry, and the skin may peel and crack. You may also have nerve damage in your legs and feet causing decreased feeling in them. You may not notice minor injuries to your feet that could lead to infections or more serious problems. Taking care of your feet is one of the most important things you can do for yourself. Follow these instructions at home:  Wear shoes at all times, even in the house. Do not go barefoot. Bare feet are easily injured.  Check your feet daily for blisters, cuts, and redness. If you cannot see the bottom of your feet, use a mirror or ask someone for help.  Wash your feet with warm water (do not use hot water) and mild soap. Then pat your feet and the areas between your toes until they are completely dry. Do not soak your feet as this can dry your skin.  Apply a moisturizing lotion or petroleum jelly (that does not contain alcohol and is unscented) to the skin on your feet and to dry, brittle toenails. Do not apply lotion between your toes.  Trim your toenails straight across. Do not dig under them or around the cuticle. File the edges of your nails with an emery board or nail file.  Do not cut corns or calluses or try to remove them with medicine.  Wear clean socks or stockings every day. Make sure they are not too tight. Do not wear knee-high stockings since they may decrease blood flow to your legs.  Wear shoes that fit properly and have enough cushioning. To break in new shoes, wear them for just a few hours a day. This prevents you from injuring your feet. Always look in your shoes before you put them on to be sure there are no objects inside.  Do not cross your legs. This may decrease the blood flow to your feet.  If you find a  minor scrape, cut, or break in the skin on your feet, keep it and the skin around it clean and dry. These areas may be cleansed with mild soap and water. Do not cleanse the area with peroxide, alcohol, or iodine.  When you remove an adhesive bandage, be sure not to damage the skin around it.  If you have a wound, look at it several times a day to make sure it is healing.  Do not use heating pads or hot water bottles. They may burn your skin. If you have lost feeling in your feet or legs, you may not know it is happening until it is too late.  Make sure your health care provider performs a complete foot exam at least annually or more often if you have foot problems. Report any cuts, sores, or bruises to your health care provider immediately. Contact a health care provider if:  You have an injury that is not healing.  You have cuts or breaks in the skin.  You have an ingrown nail.  You notice redness on your legs or feet.  You feel burning or tingling in your legs or feet.  You have pain or cramps in your legs and feet.  Your legs or feet are numb.  Your feet always feel cold. Get help right away if:  There is increasing   redness, swelling, or pain in or around a wound.  There is a red line that goes up your leg.  Pus is coming from a wound.  You develop a fever or as directed by your health care provider.  You notice a bad smell coming from an ulcer or wound. This information is not intended to replace advice given to you by your health care provider. Make sure you discuss any questions you have with your health care provider. Document Released: 07/25/2000 Document Revised: 01/03/2016 Document Reviewed: 01/04/2013 Elsevier Interactive Patient Education  2017 [place]. Diabetes Mellitus and Exercise Exercising regularly is important for your overall health, especially when you have diabetes (diabetes mellitus). Exercising is not only about losing weight. It has many health  benefits, such as increasing muscle strength and bone density and reducing body fat and stress. This leads to improved fitness, flexibility, and endurance, all of which result in better overall health. Exercise has additional benefits for people with diabetes, including:  Reducing appetite.  Helping to lower and control blood glucose.  Lowering blood pressure.  Helping to control amounts of fatty substances (lipids) in the blood, such as cholesterol and triglycerides.  Helping the body to respond better to insulin (improving insulin sensitivity).  Reducing how much insulin the body needs.  Decreasing the risk for heart disease by: ? Lowering cholesterol and triglyceride levels. ? Increasing the levels of good cholesterol. ? Lowering blood glucose levels.  What is my activity plan? Your health care provider or certified diabetes educator can help you make a plan for the type and frequency of exercise (activity plan) that works for you. Make sure that you:  Do at least 150 minutes of moderate-intensity or vigorous-intensity exercise each week. This could be brisk walking, biking, or water aerobics. ? Do stretching and strength exercises, such as yoga or weightlifting, at least 2 times a week. ? Spread out your activity over at least 3 days of the week.  Get some form of physical activity every day. ? Do not go more than 2 days in a row without some kind of physical activity. ? Avoid being inactive for more than 90 minutes at a time. Take frequent breaks to walk or stretch.  Choose a type of exercise or activity that you enjoy, and set realistic goals.  Start slowly, and gradually increase the intensity of your exercise over time.  What do I need to know about managing my diabetes?  Check your blood glucose before and after exercising. ? If your blood glucose is higher than 240 mg/dL (13.3 mmol/L) before you exercise, check your urine for ketones. If you have ketones in your urine,  do not exercise until your blood glucose returns to normal.  Know the symptoms of low blood glucose (hypoglycemia) and how to treat it. Your risk for hypoglycemia increases during and after exercise. Common symptoms of hypoglycemia can include: ? Hunger. ? Anxiety. ? Sweating and feeling clammy. ? Confusion. ? Dizziness or feeling light-headed. ? Increased heart rate or palpitations. ? Blurry vision. ? Tingling or numbness around the mouth, lips, or tongue. ? Tremors or shakes. ? Irritability.  Keep a rapid-acting carbohydrate snack available before, during, and after exercise to help prevent or treat hypoglycemia.  Avoid injecting insulin into areas of the body that are going to be exercised. For example, avoid injecting insulin into: ? The arms, when playing tennis. ? The legs, when jogging.  Keep records of your exercise habits. Doing this can help you and  your health care provider adjust your diabetes management plan as needed. Write down: ? Food that you eat before and after you exercise. ? Blood glucose levels before and after you exercise. ? The type and amount of exercise you have done. ? When your insulin is expected to peak, if you use insulin. Avoid exercising at times when your insulin is peaking.  When you start a new exercise or activity, work with your health care provider to make sure the activity is safe for you, and to adjust your insulin, medicines, or food intake as needed.  Drink plenty of water while you exercise to prevent dehydration or heat stroke. Drink enough fluid to keep your urine clear or pale yellow. This information is not intended to replace advice given to you by your health care provider. Make sure you discuss any questions you have with your health care provider. Document Released: 10/18/2003 Document Revised: 02/15/2016 Document Reviewed: 01/07/2016 Elsevier Interactive Patient Education  2018 [employer]. Diabetes Mellitus and Standards of  Medical Care Managing diabetes (diabetes mellitus) can be complicated. Your diabetes treatment may be managed by a team of health care providers, including:  A diet and nutrition specialist (registered dietitian).  A nurse.  A certified diabetes educator (CDE).  A diabetes specialist (endocrinologist).  An eye doctor.  A primary care provider.  A dentist.  Your health care providers follow a schedule in order to help you get the best quality of care. The following schedule is a general guideline for your diabetes management plan. Your health care providers may also give you more specific instructions. HbA1c ( hemoglobin A1c) test This test provides information about blood sugar (glucose) control over the previous 2-3 months. It is used to check whether your diabetes management plan needs to be adjusted.  If you are meeting your treatment goals, this test is done at least 2 times a year.  If you are not meeting treatment goals or if your treatment goals have changed, this test is done 4 times a year.  Blood pressure test  This test is done at every routine medical visit. For most people, the goal is less than 130/80. Ask your health care provider what your goal blood pressure should be. Dental and eye exams  Visit your dentist two times a year.  If you have type 1 diabetes, get an eye exam 3-5 years after you are diagnosed, and then once a year after your first exam. ? If you were diagnosed with type 1 diabetes as a child, get an eye exam when you are age 27 or older and have had diabetes for 3-5 years. After the first exam, you should get an eye exam once a year.  If you have type 2 diabetes, have an eye exam as soon as you are diagnosed, and then once a year after your first exam. Foot care exam  Visual foot exams are done at every routine medical visit. The exams check for cuts, bruises, redness, blisters, sores, or other problems with the feet.  A complete foot exam is  done by your health care provider once a year. This exam includes an inspection of the structure and skin of your feet, and a check of the pulses and sensation in your feet. ? Type 1 diabetes: Get your first exam 3-5 years after diagnosis. ? Type 2 diabetes: Get your first exam as soon as you are diagnosed.  Check your feet every day for cuts, bruises, redness, blisters, or sores. If you  have any of these or other problems that are not healing, contact your health care provider. Kidney function test ( urine microalbumin)  This test is done once a year. ? Type 1 diabetes: Get your first test 5 years after diagnosis. ? Type 2 diabetes: Get your first test as soon as you are diagnosed.  If you have chronic kidney disease (CKD), get a serum creatinine and estimated glomerular filtration rate (eGFR) test once a year. Lipid profile (cholesterol, HDL, LDL, triglycerides)  This test should be done when you are diagnosed with diabetes, and every 5 years after the first test. If you are on medicines to lower your cholesterol, you may need to get this test done every year. ? The goal for LDL is less than 100 mg/dL (5.5 mmol/L). If you are at high risk, the goal is less than 70 mg/dL (3.9 mmol/L). ? The goal for HDL is 40 mg/dL (2.2 mmol/L) for men and 50 mg/dL(2.8 mmol/L) for women. An HDL cholesterol of 60 mg/dL (3.3 mmol/L) or higher gives some protection against heart disease. ? The goal for triglycerides is less than 150 mg/dL (8.3 mmol/L). Immunizations  The yearly flu (influenza) vaccine is recommended for everyone 6 months or older who has diabetes.  The pneumonia (pneumococcal) vaccine is recommended for everyone 2 years or older who has diabetes. If you are 6 or older, you may get the pneumonia vaccine as a series of two separate shots.  The hepatitis B vaccine is recommended for adults shortly after they have been diagnosed with diabetes.  The Tdap (tetanus, diphtheria, and pertussis)  vaccine should be given: ? According to normal childhood vaccination schedules, for children. ? Every 10 years, for adults who have diabetes.  The shingles vaccine is recommended for people who have had chicken pox and are 50 years or older. Mental and emotional health  Screening for symptoms of eating disorders, anxiety, and depression is recommended at the time of diagnosis and afterward as needed. If your screening shows that you have symptoms (you have a positive screening result), you may need further evaluation and be referred to a mental health care provider. Diabetes self-management education  Education about how to manage your diabetes is recommended at diagnosis and ongoing as needed. Treatment plan  Your treatment plan will be reviewed at every medical visit. Summary  Managing diabetes (diabetes mellitus) can be complicated. Your diabetes treatment may be managed by a team of health care providers.  Your health care providers follow a schedule in order to help you get the best quality of care.  Standards of care including having regular physical exams, blood tests, blood pressure monitoring, immunizations, screening tests, and education about how to manage your diabetes.  Your health care providers may also give you more specific instructions based on your individual health. This information is not intended to replace advice given to you by your health care provider. Make sure you discuss any questions you have with your health care provider. Document Released: 05/25/2009 Document Revised: 04/25/2016 Document Reviewed: 04/25/2016 Elsevier Interactive Patient Education  [employer].

## 2017-03-11 NOTE — Telephone Encounter (Signed)
Pt. Called requesting a refill on Tylenol # . Please f/u with pt.

## 2017-03-12 ENCOUNTER — Telehealth: Payer: Self-pay | Admitting: Internal Medicine

## 2017-03-12 NOTE — Telephone Encounter (Signed)
Pt called again requesting a refill of his Tylenol #3. States that he is experiencing pain and needs his meds. Requests CMA call when script ready for pickup. Thank you.

## 2017-03-13 ENCOUNTER — Other Ambulatory Visit: Payer: Self-pay | Admitting: Internal Medicine

## 2017-03-13 DIAGNOSIS — M1711 Unilateral primary osteoarthritis, right knee: Secondary | ICD-10-CM

## 2017-03-13 MED ORDER — ACETAMINOPHEN-CODEINE #3 300-30 MG PO TABS: 1.0000 | ORAL_TABLET | Freq: Three times a day (TID) | ORAL | 0 refills | Status: DC | PRN

## 2017-03-13 NOTE — Telephone Encounter (Signed)
Pt called back checking on status of medication refill for Tylenol #3, pt states he is completely out, please f/up

## 2017-03-16 DIAGNOSIS — J129 Viral pneumonia, unspecified: Secondary | ICD-10-CM | POA: Diagnosis not present

## 2017-03-16 DIAGNOSIS — J449 Chronic obstructive pulmonary disease, unspecified: Secondary | ICD-10-CM | POA: Diagnosis not present

## 2017-03-26 ENCOUNTER — Ambulatory Visit: Payer: Medicare Other | Admitting: Internal Medicine

## 2017-03-31 ENCOUNTER — Encounter: Payer: Self-pay | Admitting: Internal Medicine

## 2017-03-31 ENCOUNTER — Telehealth: Payer: Self-pay | Admitting: Licensed Clinical Social Worker

## 2017-03-31 ENCOUNTER — Ambulatory Visit (INDEPENDENT_AMBULATORY_CARE_PROVIDER_SITE_OTHER): Payer: Medicare HMO | Admitting: Internal Medicine

## 2017-03-31 VITALS — BP 137/81 | HR 100 | Temp 98.4°F | Ht 69.0 in | Wt 265.0 lb

## 2017-03-31 DIAGNOSIS — J449 Chronic obstructive pulmonary disease, unspecified: Secondary | ICD-10-CM | POA: Diagnosis not present

## 2017-03-31 DIAGNOSIS — Z794 Long term (current) use of insulin: Secondary | ICD-10-CM | POA: Diagnosis not present

## 2017-03-31 DIAGNOSIS — R6882 Decreased libido: Secondary | ICD-10-CM

## 2017-03-31 DIAGNOSIS — E1165 Type 2 diabetes mellitus with hyperglycemia: Secondary | ICD-10-CM | POA: Diagnosis not present

## 2017-03-31 DIAGNOSIS — B20 Human immunodeficiency virus [HIV] disease: Secondary | ICD-10-CM | POA: Diagnosis not present

## 2017-03-31 DIAGNOSIS — R69 Illness, unspecified: Secondary | ICD-10-CM | POA: Diagnosis not present

## 2017-03-31 LAB — CBC WITH DIFFERENTIAL/PLATELET
BASOS ABS: 0 {cells}/uL (ref 0–200)
Basophils Relative: 0 %
EOS PCT: 2 %
Eosinophils Absolute: 146 cells/uL (ref 15–500)
HCT: 47.3 % (ref 38.5–50.0)
HEMOGLOBIN: 14.8 g/dL (ref 13.2–17.1)
LYMPHS ABS: 1971 {cells}/uL (ref 850–3900)
LYMPHS PCT: 27 %
MCH: 29.3 pg (ref 27.0–33.0)
MCHC: 31.3 g/dL — ABNORMAL LOW (ref 32.0–36.0)
MCV: 93.7 fL (ref 80.0–100.0)
MPV: 10.9 fL (ref 7.5–12.5)
Monocytes Absolute: 876 cells/uL (ref 200–950)
Monocytes Relative: 12 %
NEUTROS PCT: 59 %
Neutro Abs: 4307 cells/uL (ref 1500–7800)
Platelets: 206 10*3/uL (ref 140–400)
RBC: 5.05 MIL/uL (ref 4.20–5.80)
RDW: 13.2 % (ref 11.0–15.0)
WBC: 7.3 10*3/uL (ref 3.8–10.8)

## 2017-03-31 MED ORDER — BICTEGRAVIR-EMTRICITAB-TENOFOV 50-200-25 MG PO TABS: 1.0000 | ORAL_TABLET | Freq: Every day | ORAL | 6 refills | Status: DC

## 2017-03-31 NOTE — Telephone Encounter (Signed)
Spoke to patient and scheduled apt for him to come in Thurs at 8:45 am.  Vergia Alberts, [facility]

## 2017-03-31 NOTE — Progress Notes (Signed)
RFV: follow up for hiv disease  Patient ID: Chris [name]., male   DOB: Feb 07, 1966, 51 y.o.   MRN: [id number]  HPI Lewin is a 51yo M with HIV disease, CD 4 count of 370/VL<20 ( April 2018) currently on genvoya. He states that he has lost a few pounds, trying to decrease soda intake. He states that he still suffers from decreased libido for which he takes viagra but still has difficulty with ejaculation. He also subscribes to low energy/fatigue.   Outpatient Encounter Prescriptions as of 03/31/2017  Medication Sig  . ACCU-CHEK SOFTCLIX LANCETS lancets Use as instructed  . acetaminophen-codeine (TYLENOL #3) 300-30 MG tablet Take 1 tablet by mouth every 8 (eight) hours as needed for moderate pain.  [name] albuterol (PROVENTIL HFA;VENTOLIN HFA) 108 (90 Base) MCG/ACT inhaler Inhale 2 puffs into the lungs every 4 (four) hours as needed for wheezing or shortness of breath (((PLAN B))).  [name] atorvastatin (LIPITOR) 20 MG tablet Take 1 tablet (20 mg total) by mouth daily.  . Blood Glucose Monitoring Suppl (ACCU-CHEK AVIVA PLUS) W/DEVICE KIT 250 Test up to 4 times daily  . budesonide-formoterol (SYMBICORT) 160-4.5 MCG/ACT inhaler Take 2 puffs first thing in am and then another 2 puffs about 12 hours later.  . clotrimazole (LOTRIMIN) 1 % cream Apply 1 application topically 2 (two) times daily.  [name] elvitegravir-cobicistat-emtricitabine-tenofovir (GENVOYA) 150-150-200-10 MG TABS tablet Take 1 tablet by mouth daily with breakfast.  . EZ SMART BLOOD GLUCOSE LANCETS MISC 1 Units by Does not apply route 4 (four) times daily.  [name] glucose blood test strip Use as instructed  . guaiFENesin-dextromethorphan (ROBITUSSIN DM) 100-10 MG/5ML syrup Take 5 mLs by mouth every 4 (four) hours as needed for cough.  . insulin glargine (LANTUS) 100 UNIT/ML injection Inject 0.25 mLs (25 Units total) into the skin every morning.  . Insulin Pen Needle 32G X 5 MM MISC Use as directed  . Insulin Syringe-Needle U-100 (BD INSULIN SYRINGE  ULTRAFINE) 31G X 15/64" 0.5 ML MISC Use as directed  . ipratropium-albuterol (DUONEB) 0.5-2.5 (3) MG/3ML SOLN Take 3 mLs by nebulization every 6 (six) hours as needed. (Patient taking differently: Take 3 mLs by nebulization every 6 (six) hours as needed (shortness of breath). )  . magic mouthwash SOLN Take 5 mLs by mouth 4 (four) times daily.  . metFORMIN (GLUCOPHAGE) 1000 MG tablet Take 1 tablet (1,000 mg total) by mouth 2 (two) times daily with a meal.  . OXYGEN Place 2 L/min into the nose as needed.   . pantoprazole (PROTONIX) 40 MG tablet Take 1 tablet (40 mg total) by mouth 2 (two) times daily before a meal.  . sildenafil (VIAGRA) 50 MG tablet Take 0.5 tablets (25 mg total) by mouth daily as needed for erectile dysfunction.  . Tiotropium Bromide Monohydrate (SPIRIVA RESPIMAT) 2.5 MCG/ACT AERS Inhale 2 puffs into the lungs daily.   No facility-administered encounter medications on file as of 03/31/2017.      Patient Active Problem List   Diagnosis Date Noted  . Acute bronchitis 11/12/2016  . Special screening for malignant neoplasms, colon   . Dysphagia   . Candida esophagitis ([place])   . Abdominal pain, epigastric 07/17/2016  . Candidiasis of mouth 03/27/2016  . Adjustment disorder with depressed mood 01/04/2016  . Callus of foot 01/04/2016  . Hoarseness of voice 10/04/2015  . AIDS (acquired immunodeficiency syndrome) ([place]) 10/04/2015  . Type 2 diabetes mellitus with complication, with long-term current use of insulin ([place]) 10/04/2015  . Cigarette  nicotine dependence without complication 16/05/9603  . Morbid (severe) obesity due to excess calories ([place]) 09/04/2015  . Primary osteoarthritis of right knee 03/26/2015  . Genital warts 01/22/2015  . Other male erectile dysfunction 01/22/2015  . Type 2 diabetes mellitus with complication ([name]) 54/04/8118  . Essential hypertension, benign 12/04/2014  . Atopic eczema 12/04/2014  . Cyst (solitary) of breast 08/23/2013  . Breast abscess  08/23/2013  . Obstructive sleep apnea syndrome 08/23/2013  . Cigarette smoker 02/17/2013  . Hyperglycemia 02/17/2013  . Uncontrolled diabetes mellitus ([place]) 02/17/2013  . DM (diabetes mellitus) ([place]) 01/27/2013  . Steroid-induced hyperglycemia 01/12/2013  . Chronic respiratory failure with hypoxia and hypercapnia ([place]) 01/12/2013  . COPD  GOLD III, still smoking 01/11/2013  . Nicotine abuse 01/11/2013  . HIV disease ([place]) 12/06/2012     Health Maintenance Due  Topic Date Due  . FOOT EXAM  04/12/1976  . OPHTHALMOLOGY EXAM  04/12/1976  . TETANUS/TDAP  04/12/1985  . INFLUENZA VACCINE  03/11/2017     Review of Systems Per hpi Physical Exam   Pulse 100   Ht '5\' 9"'  (1.753 m)   Wt 265 lb (120.2 kg)   SpO2 94% Comment: 2LNC  BMI 39.13 kg/m   Physical Exam  Constitutional: He is oriented to person, place, and time. He appears well-developed and well-nourished. No distress.  HENT:  Mouth/Throat: Oropharynx is clear and moist. No oropharyngeal exudate.  Cardiovascular: Normal rate, regular rhythm and normal heart sounds. Exam reveals no gallop and no friction rub.  No murmur heard.  Pulmonary/Chest: Effort normal and breath sounds normal. No respiratory distress. He has no wheezes.  Abdominal: Soft. Bowel sounds are normal. He exhibits no distension. There is no tenderness.  Lymphadenopathy:  He has no cervical adenopathy.  Neurological: He is alert and oriented to person, place, and time.  Skin: Skin is warm and dry. No rash noted. No erythema.  Psychiatric: He has a normal mood and affect. His behavior is normal.    Lab Results  Component Value Date   CD4TCELL 13 (L) 11/13/2016   Lab Results  Component Value Date   CD4TABS 370 (L) 11/13/2016   CD4TABS 310 (L) 06/23/2016   CD4TABS 230 (L) 09/27/2015   Lab Results  Component Value Date   HIV1RNAQUANT <20 DETECTED (A) 11/13/2016   Lab Results  Component Value Date   HEPBSAB REACTIVE (A) 12/06/2012   Lab Results    Component Value Date   LABRPR NON REAC 06/23/2016    CBC Lab Results  Component Value Date   WBC 8.6 11/13/2016   RBC 5.03 11/13/2016   HGB 15.0 11/13/2016   HCT 46.8 11/13/2016   PLT 208 11/13/2016   MCV 93.0 11/13/2016   MCH 29.8 11/13/2016   MCHC 32.1 11/13/2016   RDW 12.8 11/13/2016   LYMPHSABS 2,924 11/13/2016   MONOABS 946 11/13/2016   EOSABS 172 11/13/2016    BMET Lab Results  Component Value Date   NA 140 11/13/2016   K 4.4 11/13/2016   CL 102 11/13/2016   CO2 28 11/13/2016   GLUCOSE 201 (H) 11/13/2016   BUN 11 11/13/2016   CREATININE 0.90 11/13/2016   CALCIUM 9.3 11/13/2016   GFRNONAA >89 11/13/2016   GFRAA >89 11/13/2016      Assessment and Plan  hiv disease= will check labs today but plan to change off of genvoya onto cobi sparing regimen. Will switch him to biktarvy so that he has less drug interactions with his steroid inhalers  Copd =  continue on current regimen  Decrease libido = will check testoterone level  Uncontrolled type 2 DM = recommend to switch to other carbonated drinks, low sugar/sugar substitute, rather than pepsi

## 2017-04-01 LAB — COMPLETE METABOLIC PANEL WITH GFR
ALBUMIN: 3.9 g/dL (ref 3.6–5.1)
ALK PHOS: 56 U/L (ref 40–115)
ALT: 18 U/L (ref 9–46)
AST: 12 U/L (ref 10–35)
BILIRUBIN TOTAL: 0.4 mg/dL (ref 0.2–1.2)
BUN: 8 mg/dL (ref 7–25)
CALCIUM: 9.2 mg/dL (ref 8.6–10.3)
CO2: 26 mmol/L (ref 20–32)
Chloride: 101 mmol/L (ref 98–110)
Creat: 0.81 mg/dL (ref 0.70–1.33)
GLUCOSE: 276 mg/dL — AB (ref 65–99)
Potassium: 4.2 mmol/L (ref 3.5–5.3)
Sodium: 138 mmol/L (ref 135–146)
TOTAL PROTEIN: 7.6 g/dL (ref 6.1–8.1)

## 2017-04-01 LAB — T-HELPER CELL (CD4) - (RCID CLINIC ONLY)
CD4 T CELL ABS: 240 /uL — AB (ref 400–2700)
CD4 T CELL HELPER: 11 % — AB (ref 33–55)

## 2017-04-01 LAB — TESTOSTERONE TOTAL,FREE,BIO, MALES
ALBUMIN: 3.9 g/dL (ref 3.6–5.1)
SEX HORMONE BINDING: 36 nmol/L (ref 10–50)
TESTOSTERONE: 400 ng/dL (ref 250–827)
Testosterone, Bioavailable: 90.7 ng/dL — ABNORMAL LOW (ref 110.0–575.0)
Testosterone, Free: 50.5 pg/mL (ref 46.0–224.0)

## 2017-04-02 ENCOUNTER — Telehealth: Payer: Self-pay | Admitting: Licensed Clinical Social Worker

## 2017-04-02 ENCOUNTER — Institutional Professional Consult (permissible substitution): Payer: Medicare HMO | Admitting: Licensed Clinical Social Worker

## 2017-04-02 NOTE — Telephone Encounter (Signed)
Spoke with patient about missed appointment today.  Patient reported that he was not feeling well and his oxygen saturation was low.  Instead of rescheduling appointment, patient reported that he had business card for [facility] and will call for appointment once he is feeling well.  Vergia Alberts, [facility]

## 2017-04-03 LAB — HIV-1 RNA QUANT-NO REFLEX-BLD
HIV 1 RNA QUANT: NOT DETECTED {copies}/mL
HIV-1 RNA QUANT, LOG: NOT DETECTED {Log_copies}/mL

## 2017-04-06 ENCOUNTER — Telehealth: Payer: Self-pay | Admitting: Acute Care

## 2017-04-06 ENCOUNTER — Other Ambulatory Visit: Payer: Self-pay | Admitting: Internal Medicine

## 2017-04-06 DIAGNOSIS — J449 Chronic obstructive pulmonary disease, unspecified: Secondary | ICD-10-CM

## 2017-04-06 NOTE — Telephone Encounter (Signed)
SPIRIVA RESPIMAT 2.5MCG INH 4GM 60D Will file in chart as: SPIRIVA RESPIMAT 2.5 MCG/ACT AERS The source prescription was discontinued on 09/25/2016 by Nyoka Cowden, MD. INHALE 2 PUFFS BY MOUTH EVERY MORNING     Disp: 4 g Refills: 0   Class: Normal Start: 04/06/2017  For: COPD GOLD III, still smoking Originally ordered: 3 years ago by Nyoka Cowden, MD Last refill: 03/13/2017 To be filled at: [id number] - [place], [place] - [address] AT [facility] [address]: [phone] Medication Refill   Santa Lighter, RPH-CPP routed conversation to You 7 hours ago (9:09 AM)    Doctor, hospital, Surescripts Out routed conversation to [facility] Refill Pool 9 hours ago (7:08 AM)     Please call patient and see if he is taking a maintenance medication in place of this med. It appears he has not been taking since 09/2016? Thanks so much.

## 2017-04-06 NOTE — Telephone Encounter (Signed)
lmtcb X1 for pt. Routing to TA for follow-up.

## 2017-04-09 MED ORDER — TIOTROPIUM BROMIDE MONOHYDRATE 2.5 MCG/ACT IN AERS: 2.0000 | INHALATION_SPRAY | Freq: Every day | RESPIRATORY_TRACT | 3 refills | Status: DC

## 2017-04-09 NOTE — Telephone Encounter (Signed)
Ok Rx sent in.

## 2017-04-09 NOTE — Telephone Encounter (Signed)
Spoke with pt, he states he is taking Spiriva Respimat as a maintenance medication daily.

## 2017-04-09 NOTE — Telephone Encounter (Signed)
Please renew. Send in prescription.Thanks so much.

## 2017-04-14 ENCOUNTER — Other Ambulatory Visit: Payer: Self-pay | Admitting: Family Medicine

## 2017-04-14 ENCOUNTER — Telehealth: Payer: Self-pay | Admitting: Internal Medicine

## 2017-04-14 DIAGNOSIS — M1711 Unilateral primary osteoarthritis, right knee: Secondary | ICD-10-CM

## 2017-04-14 MED ORDER — ACETAMINOPHEN-CODEINE #3 300-30 MG PO TABS: 1.0000 | ORAL_TABLET | Freq: Three times a day (TID) | ORAL | 0 refills | Status: DC | PRN

## 2017-04-14 NOTE — Telephone Encounter (Signed)
Pt. Came to facility requesting a refill on Tylenol # 3. Please f/u °

## 2017-04-15 ENCOUNTER — Other Ambulatory Visit: Payer: Self-pay | Admitting: Internal Medicine

## 2017-04-16 ENCOUNTER — Telehealth: Payer: Self-pay

## 2017-04-16 ENCOUNTER — Telehealth: Payer: Self-pay | Admitting: Internal Medicine

## 2017-04-16 DIAGNOSIS — J449 Chronic obstructive pulmonary disease, unspecified: Secondary | ICD-10-CM | POA: Diagnosis not present

## 2017-04-16 DIAGNOSIS — J129 Viral pneumonia, unspecified: Secondary | ICD-10-CM | POA: Diagnosis not present

## 2017-04-16 MED ORDER — TIOTROPIUM BROMIDE MONOHYDRATE 2.5 MCG/ACT IN AERS: 2.0000 | INHALATION_SPRAY | Freq: Every day | RESPIRATORY_TRACT | 3 refills | Status: DC

## 2017-04-16 NOTE — Telephone Encounter (Signed)
[facility] sent in fax for patient to have refill on spiriva. Patient was given a sample on 03/30/17. Will send in a refill for this prescription.

## 2017-04-16 NOTE — Telephone Encounter (Signed)
Spiriva 2.5 has been sent to preferred pharmacy. Pt is aware and voiced his understanding. Nothing further needed.

## 2017-04-20 ENCOUNTER — Ambulatory Visit: Payer: Medicare Other | Admitting: Internal Medicine

## 2017-05-05 ENCOUNTER — Ambulatory Visit: Payer: Medicare Other | Admitting: Internal Medicine

## 2017-05-12 ENCOUNTER — Telehealth: Payer: Self-pay | Admitting: Internal Medicine

## 2017-05-12 ENCOUNTER — Ambulatory Visit: Payer: Medicare HMO | Admitting: Internal Medicine

## 2017-05-12 NOTE — Telephone Encounter (Signed)
Pt called to request a refill for his  acetaminophen-codeine (TYLENOL #3) 300-30 MG tablet  Please follow up

## 2017-05-14 ENCOUNTER — Ambulatory Visit: Payer: Medicaid Other | Admitting: Internal Medicine

## 2017-05-14 ENCOUNTER — Other Ambulatory Visit: Payer: Self-pay | Admitting: *Deleted

## 2017-05-14 DIAGNOSIS — M1711 Unilateral primary osteoarthritis, right knee: Secondary | ICD-10-CM

## 2017-05-14 MED ORDER — ACETAMINOPHEN-CODEINE #3 300-30 MG PO TABS: 1.0000 | ORAL_TABLET | Freq: Three times a day (TID) | ORAL | 0 refills | Status: DC | PRN

## 2017-05-14 NOTE — Telephone Encounter (Signed)
Last filled on 04/14/17. 

## 2017-05-14 NOTE — Telephone Encounter (Signed)
Patient called to request a refill on his Tylenol #3 please fu

## 2017-05-14 NOTE — Telephone Encounter (Signed)
Please refill.

## 2017-05-14 NOTE — Telephone Encounter (Signed)
Patient has an appointment on 05/27/17

## 2017-05-16 DIAGNOSIS — J129 Viral pneumonia, unspecified: Secondary | ICD-10-CM | POA: Diagnosis not present

## 2017-05-16 DIAGNOSIS — J449 Chronic obstructive pulmonary disease, unspecified: Secondary | ICD-10-CM | POA: Diagnosis not present

## 2017-05-18 ENCOUNTER — Other Ambulatory Visit: Payer: Self-pay | Admitting: Internal Medicine

## 2017-05-18 NOTE — Telephone Encounter (Signed)
Refilled

## 2017-05-19 ENCOUNTER — Encounter: Payer: Self-pay | Admitting: Internal Medicine

## 2017-05-19 ENCOUNTER — Ambulatory Visit (INDEPENDENT_AMBULATORY_CARE_PROVIDER_SITE_OTHER): Payer: Medicare HMO | Admitting: Internal Medicine

## 2017-05-19 VITALS — Temp 98.1°F | Ht 69.0 in | Wt 270.0 lb

## 2017-05-19 DIAGNOSIS — B20 Human immunodeficiency virus [HIV] disease: Secondary | ICD-10-CM

## 2017-05-19 DIAGNOSIS — Z23 Encounter for immunization: Secondary | ICD-10-CM | POA: Diagnosis not present

## 2017-05-19 DIAGNOSIS — Z794 Long term (current) use of insulin: Secondary | ICD-10-CM | POA: Diagnosis not present

## 2017-05-19 DIAGNOSIS — E118 Type 2 diabetes mellitus with unspecified complications: Secondary | ICD-10-CM | POA: Diagnosis not present

## 2017-05-19 DIAGNOSIS — L853 Xerosis cutis: Secondary | ICD-10-CM

## 2017-05-19 DIAGNOSIS — R69 Illness, unspecified: Secondary | ICD-10-CM | POA: Diagnosis not present

## 2017-05-19 LAB — COMPLETE METABOLIC PANEL WITH GFR
AG Ratio: 1 (calc) (ref 1.0–2.5)
ALBUMIN MSPROF: 3.8 g/dL (ref 3.6–5.1)
ALT: 15 U/L (ref 9–46)
AST: 13 U/L (ref 10–35)
Alkaline phosphatase (APISO): 43 U/L (ref 40–115)
BUN: 7 mg/dL (ref 7–25)
CALCIUM: 8.8 mg/dL (ref 8.6–10.3)
CO2: 30 mmol/L (ref 20–32)
Chloride: 101 mmol/L (ref 98–110)
Creat: 0.88 mg/dL (ref 0.70–1.33)
GFR, EST NON AFRICAN AMERICAN: 99 mL/min/{1.73_m2} (ref >=60)
GFR, Est African American: 115 mL/min/{1.73_m2} (ref >=60)
GLUCOSE: 165 mg/dL — AB (ref 65–99)
Globulin: 3.8 g/dL (calc) — ABNORMAL HIGH (ref 1.9–3.7)
Potassium: 4.6 mmol/L (ref 3.5–5.3)
Sodium: 137 mmol/L (ref 135–146)
TOTAL PROTEIN: 7.6 g/dL (ref 6.1–8.1)
Total Bilirubin: 0.4 mg/dL (ref 0.2–1.2)

## 2017-05-19 NOTE — Progress Notes (Signed)
RFV: follow up for hiv disease  Patient ID: Chris [name]., male   DOB: 1966/02/02, 51 y.o.   MRN: [id number]  HPI  Chris [name]  A 51yo M with COPD, DM, HIV disease- cd 4 count of 240/VL<20 in summer 2018. At last visit, we switched him to biktarvy to minimize any drug effect with some of his pulmonary inhalers. He has not missed dose of hiv medication. He states that he has not lost significant weight despite trying to cut back on calories. His blood sugars still above goal per his report.  Ros: fatigue, weight gain, and pruritis to his body x 3 days -applies rubbing alcohol Outpatient Encounter Prescriptions as of 05/19/2017  Medication Sig  . ACCU-CHEK SOFTCLIX LANCETS lancets Use as instructed  . acetaminophen-codeine (TYLENOL #3) 300-30 MG tablet Take 1 tablet by mouth every 8 (eight) hours as needed for moderate pain.  [name] albuterol (PROVENTIL HFA;VENTOLIN HFA) 108 (90 Base) MCG/ACT inhaler Inhale 2 puffs into the lungs every 4 (four) hours as needed for wheezing or shortness of breath (((PLAN B))).  [name] atorvastatin (LIPITOR) 20 MG tablet Take 1 tablet (20 mg total) by mouth daily.  . bictegravir-emtricitabine-tenofovir AF (BIKTARVY) 50-200-25 MG TABS tablet Take 1 tablet by mouth daily.  . Blood Glucose Monitoring Suppl (ACCU-CHEK AVIVA PLUS) W/DEVICE KIT 250 Test up to 4 times daily  . budesonide-formoterol (SYMBICORT) 160-4.5 MCG/ACT inhaler Take 2 puffs first thing in am and then another 2 puffs about 12 hours later.  . EZ SMART BLOOD GLUCOSE LANCETS MISC 1 Units by Does not apply route 4 (four) times daily.  [name] glucose blood test strip Use as instructed  . insulin glargine (LANTUS) 100 UNIT/ML injection Inject 0.25 mLs (25 Units total) into the skin every morning.  . Insulin Pen Needle 32G X 5 MM MISC Use as directed  . Insulin Syringe-Needle U-100 (BD INSULIN SYRINGE ULTRAFINE) 31G X 15/64" 0.5 ML MISC Use as directed  . ipratropium-albuterol (DUONEB) 0.5-2.5 (3) MG/3ML SOLN Take 3  mLs by nebulization every 6 (six) hours as needed. (Patient taking differently: Take 3 mLs by nebulization every 6 (six) hours as needed (shortness of breath). )  . metFORMIN (GLUCOPHAGE) 1000 MG tablet Take 1 tablet (1,000 mg total) by mouth 2 (two) times daily with a meal.  . OXYGEN Place 2 L/min into the nose as needed.   . sildenafil (VIAGRA) 50 MG tablet Take 0.5 tablets (25 mg total) by mouth daily as needed for erectile dysfunction.  . Tiotropium Bromide Monohydrate (SPIRIVA RESPIMAT) 2.5 MCG/ACT AERS Inhale 2 puffs into the lungs daily.  . clotrimazole (LOTRIMIN) 1 % cream Apply 1 application topically 2 (two) times daily. (Patient not taking: Reported on 05/19/2017)  . pantoprazole (PROTONIX) 40 MG tablet Take 1 tablet (40 mg total) by mouth 2 (two) times daily before a meal. (Patient not taking: Reported on 05/19/2017)  . [DISCONTINUED] guaiFENesin-dextromethorphan (ROBITUSSIN DM) 100-10 MG/5ML syrup Take 5 mLs by mouth every 4 (four) hours as needed for cough.  . [DISCONTINUED] magic mouthwash SOLN Take 5 mLs by mouth 4 (four) times daily.   No facility-administered encounter medications on file as of 05/19/2017.      Patient Active Problem List   Diagnosis Date Noted  . Acute bronchitis 11/12/2016  . Special screening for malignant neoplasms, colon   . Dysphagia   . Candida esophagitis ([place])   . Abdominal pain, epigastric 07/17/2016  . Candidiasis of mouth 03/27/2016  . Adjustment disorder with depressed mood 01/04/2016  .  Callus of foot 01/04/2016  . Hoarseness of voice 10/04/2015  . AIDS (acquired immunodeficiency syndrome) ([place]) 10/04/2015  . Type 2 diabetes mellitus with complication, with long-term current use of insulin ([place]) 10/04/2015  . Cigarette nicotine dependence without complication 64/40/3474  . Morbid (severe) obesity due to excess calories ([place]) 09/04/2015  . Primary osteoarthritis of right knee 03/26/2015  . Genital warts 01/22/2015  . Other male erectile  dysfunction 01/22/2015  . Type 2 diabetes mellitus with complication ([place]) 25/95/6387  . Essential hypertension, benign 12/04/2014  . Atopic eczema 12/04/2014  . Cyst (solitary) of breast 08/23/2013  . Breast abscess 08/23/2013  . Obstructive sleep apnea syndrome 08/23/2013  . Cigarette smoker 02/17/2013  . Hyperglycemia 02/17/2013  . Uncontrolled diabetes mellitus ([place]) 02/17/2013  . DM (diabetes mellitus) ([place]) 01/27/2013  . Steroid-induced hyperglycemia 01/12/2013  . Chronic respiratory failure with hypoxia and hypercapnia ([place]) 01/12/2013  . COPD  GOLD III, still smoking 01/11/2013  . Nicotine abuse 01/11/2013  . HIV disease ([place]) 12/06/2012     Health Maintenance Due  Topic Date Due  . FOOT EXAM  04/12/1976  . OPHTHALMOLOGY EXAM  04/12/1976  . TETANUS/TDAP  04/12/1985  . INFLUENZA VACCINE  03/11/2017  . PNEUMOCOCCAL POLYSACCHARIDE VACCINE (2) 05/11/2017    Soc hx: rare smoking  Review of Systems Per hpi otherwise 12 point ros is negative Physical Exam   Temp 98.1 F (36.7 C) (Oral)   Ht '5\' 9"'  (1.753 m)   Wt 270 lb (122.5 kg)   BMI 39.87 kg/m   Physical Exam  Constitutional: He is oriented to person, place, and time. He appears well-developed and well-nourished. No distress.  HENT: wearing oxygen Mouth/Throat: Oropharynx is clear and moist. No oropharyngeal exudate.  Cardiovascular: Normal rate, regular rhythm and normal heart sounds. Exam reveals no gallop and no friction rub.  No murmur heard.  Pulmonary/Chest: Effort normal and breath sounds normal. No respiratory distress. He has no wheezes.  Abdominal: Soft. Bowel sounds are normal. He exhibits no distension. There is no tenderness.  Lymphadenopathy:  He has no cervical adenopathy.  Neurological: He is alert and oriented to person, place, and time.  Skin: Skin is warm and dry. No rash noted. No erythema.  Psychiatric: He has a normal mood and affect. His behavior is normal.    Lab Results  Component  Value Date   CD4TCELL 11 (L) 03/31/2017   Lab Results  Component Value Date   CD4TABS 240 (L) 03/31/2017   CD4TABS 370 (L) 11/13/2016   CD4TABS 310 (L) 06/23/2016   Lab Results  Component Value Date   HIV1RNAQUANT <20 NOT DETECTED 03/31/2017   Lab Results  Component Value Date   HEPBSAB REACTIVE (A) 12/06/2012   Lab Results  Component Value Date   LABRPR NON REAC 06/23/2016    CBC Lab Results  Component Value Date   WBC 7.3 03/31/2017   RBC 5.05 03/31/2017   HGB 14.8 03/31/2017   HCT 47.3 03/31/2017   PLT 206 03/31/2017   MCV 93.7 03/31/2017   MCH 29.3 03/31/2017   MCHC 31.3 (L) 03/31/2017   RDW 13.2 03/31/2017   LYMPHSABS 1,971 03/31/2017   MONOABS 876 03/31/2017   EOSABS 146 03/31/2017    BMET Lab Results  Component Value Date   NA 138 03/31/2017   K 4.2 03/31/2017   CL 101 03/31/2017   CO2 26 03/31/2017   GLUCOSE 276 (H) 03/31/2017   BUN 8 03/31/2017   CREATININE 0.81 03/31/2017   CALCIUM 9.2 03/31/2017  GFRNONAA >89 03/31/2017   GFRAA >89 03/31/2017      Assessment and Plan hiv disease = will check cd 4 count and viral load to see if still well controlled now that he is on a new regimen  Fatigue = appears to have poor sleep, likley multifactorial. He sees pulmonary next week   Dry skin = needs to moisturizing. Will check cmp. Not thought to be related to new drug since his sx predated the initiation of new med  DM, poorly controlled = recommend to keep record of his blood sugars to show his pcp to improve better control of his disease process

## 2017-05-19 NOTE — Patient Instructions (Signed)
Use combination of eucerin plus vaseline to apply to dry skin to see if that helps with itching

## 2017-05-20 LAB — T-HELPER CELL (CD4) - (RCID CLINIC ONLY)
CD4 % Helper T Cell: 13 % — ABNORMAL LOW (ref 33–55)
CD4 T Cell Abs: 380 /uL — ABNORMAL LOW (ref 400–2700)

## 2017-05-21 LAB — HIV-1 RNA QUANT-NO REFLEX-BLD
HIV 1 RNA QUANT: NOT DETECTED {copies}/mL
HIV-1 RNA QUANT, LOG: NOT DETECTED {Log_copies}/mL

## 2017-05-21 IMAGING — DX DG CHEST 2V
2 series · 2 of 2 positions shown · non-contrast
Comparison: January 17, 2016

CLINICAL DATA: Shortness of Breath

EXAM:
CHEST  2 VIEW

[w chest pa]
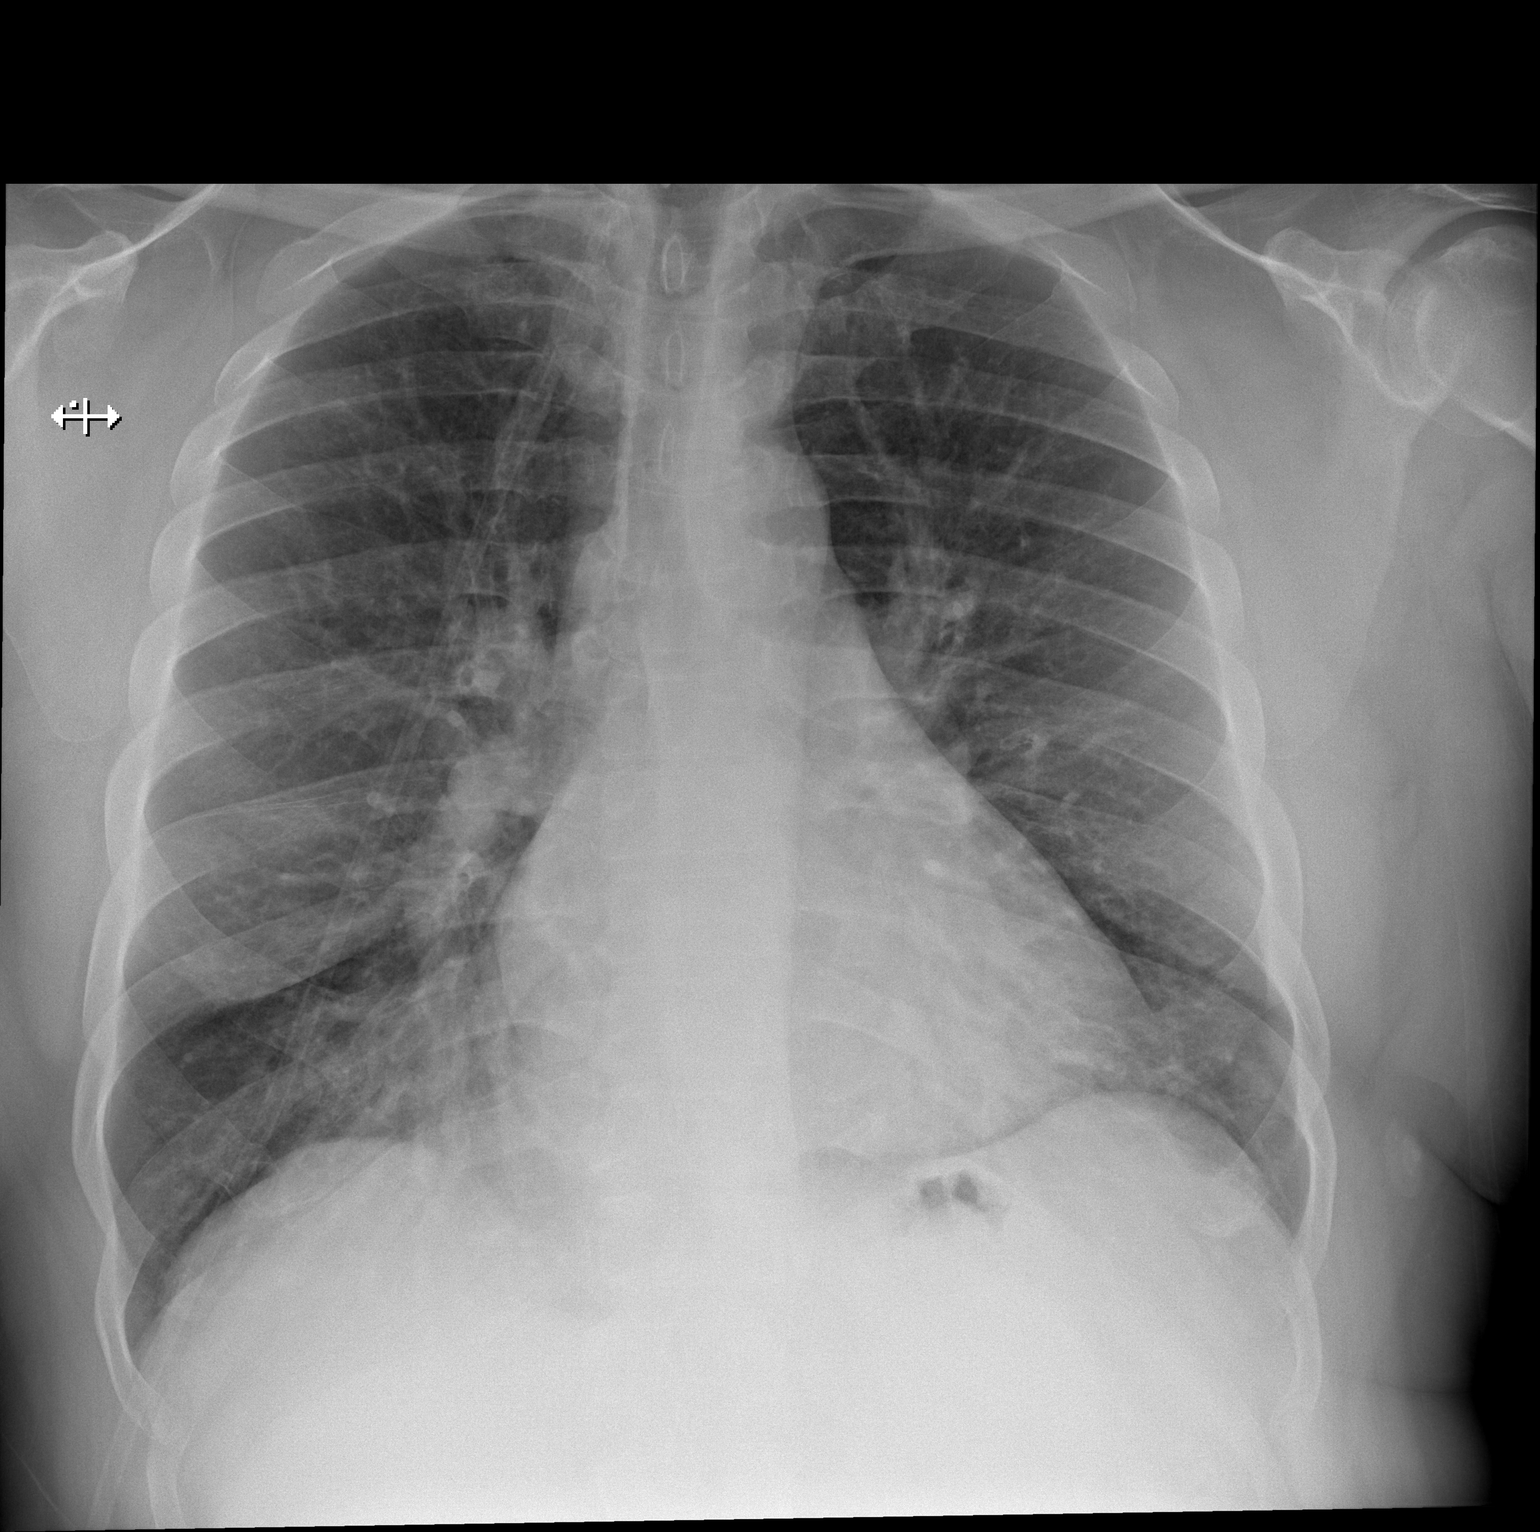

[w chest lat]
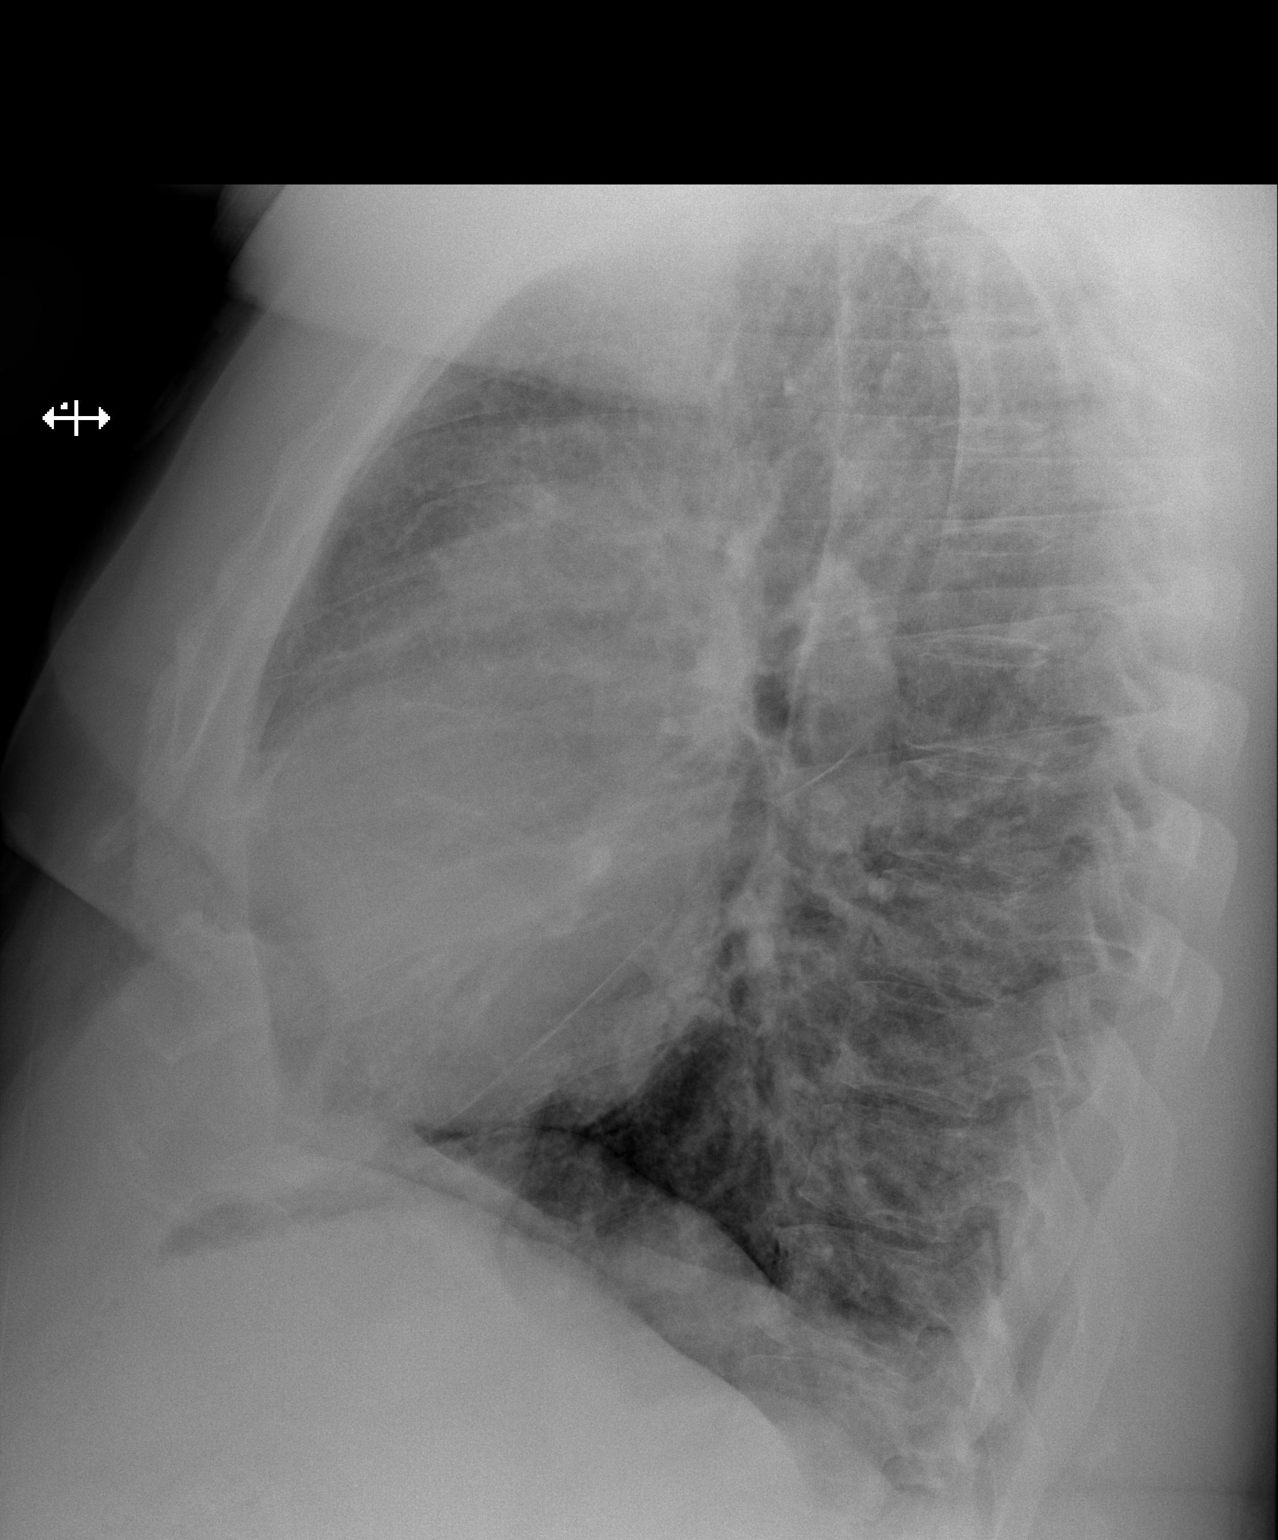

[2 of 2 positions shown; findings below may reference images not displayed]

FINDINGS: There is no edema or consolidation. There is borderline cardiomegaly
with pulmonary venous hypertension. No adenopathy. No bone lesions.
IMPRESSION: Pulmonary vascular congestion without frank edema or consolidation.

## 2017-05-22 IMAGING — DX DG CHEST 2V
2 series · 2 of 2 positions shown · non-contrast
Comparison: August 07, 2016

CLINICAL DATA: Cough and congestion ; shortness of breath

EXAM:
CHEST  2 VIEW

[chest pa]
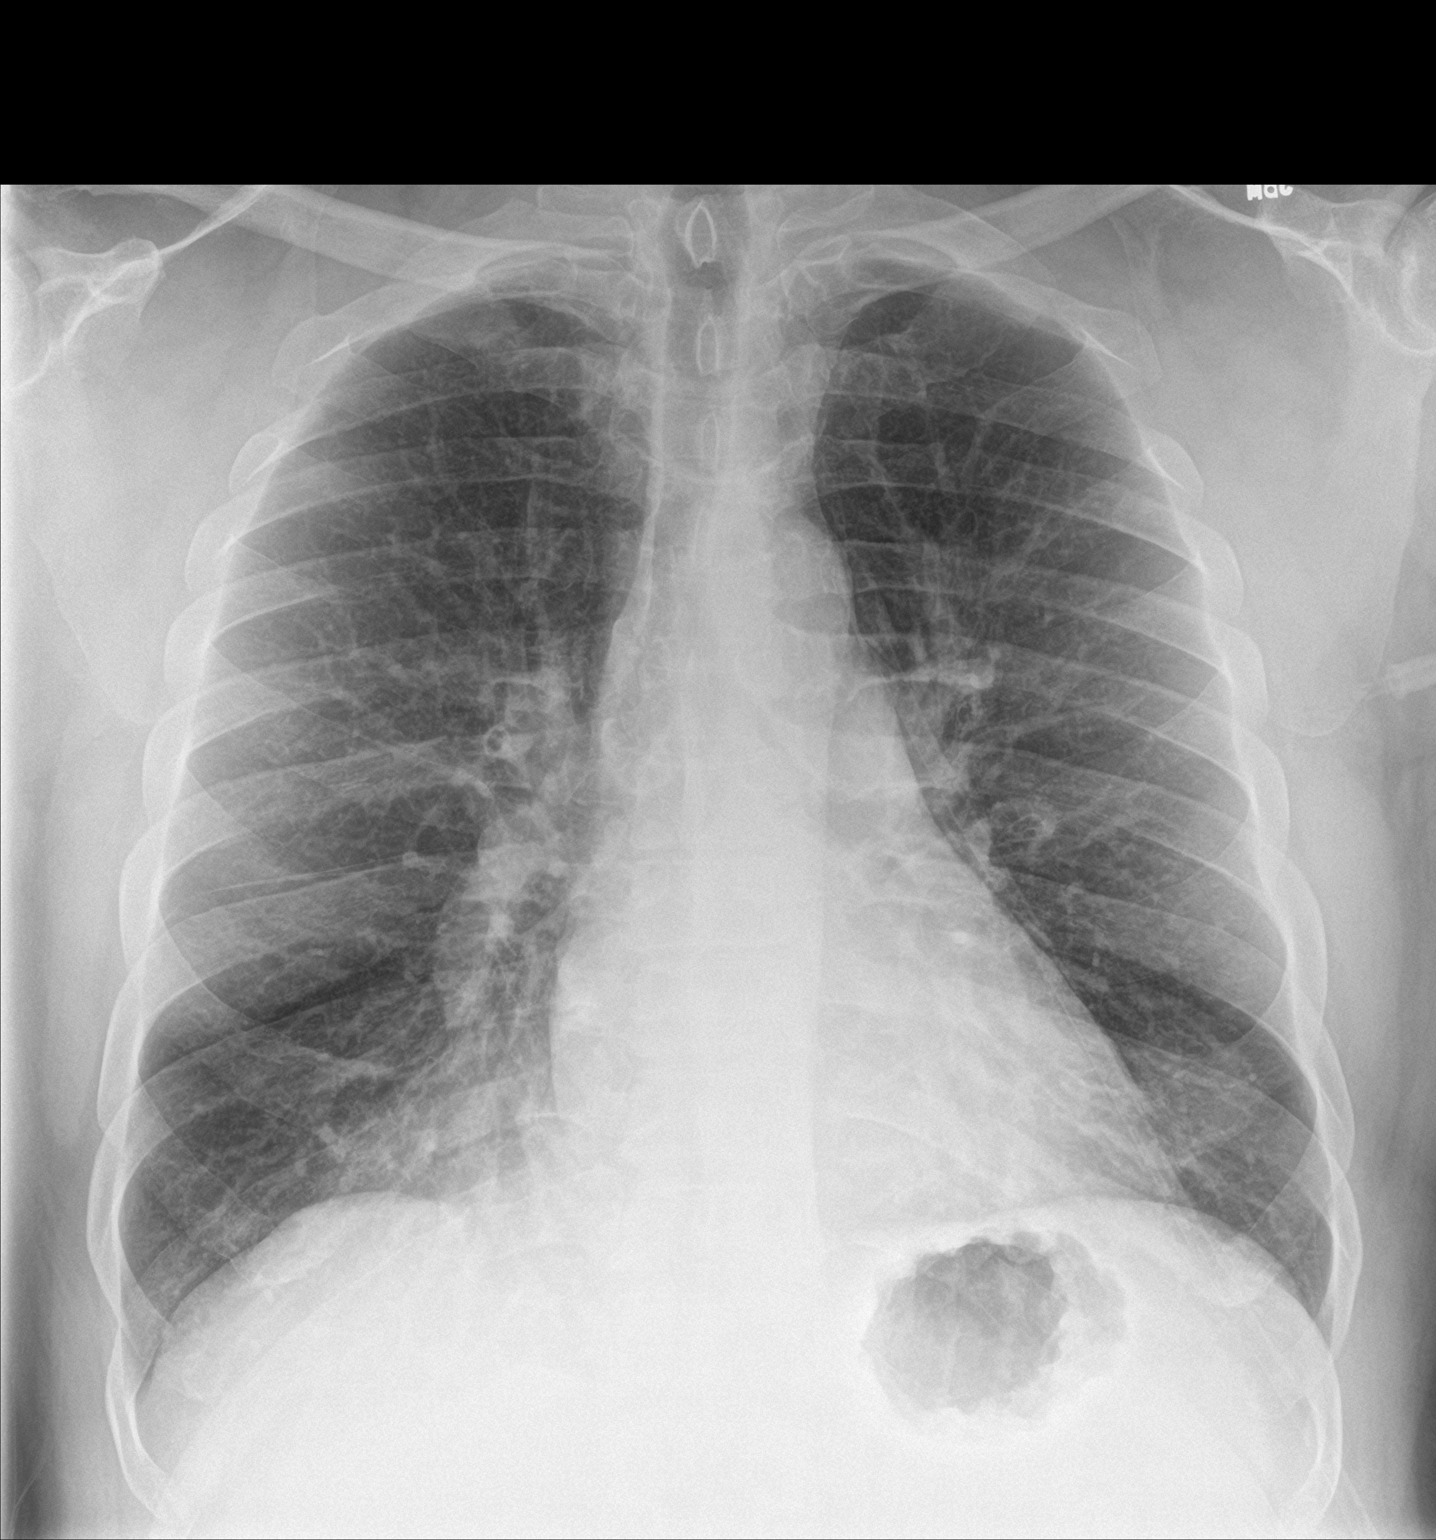

[chest lat]
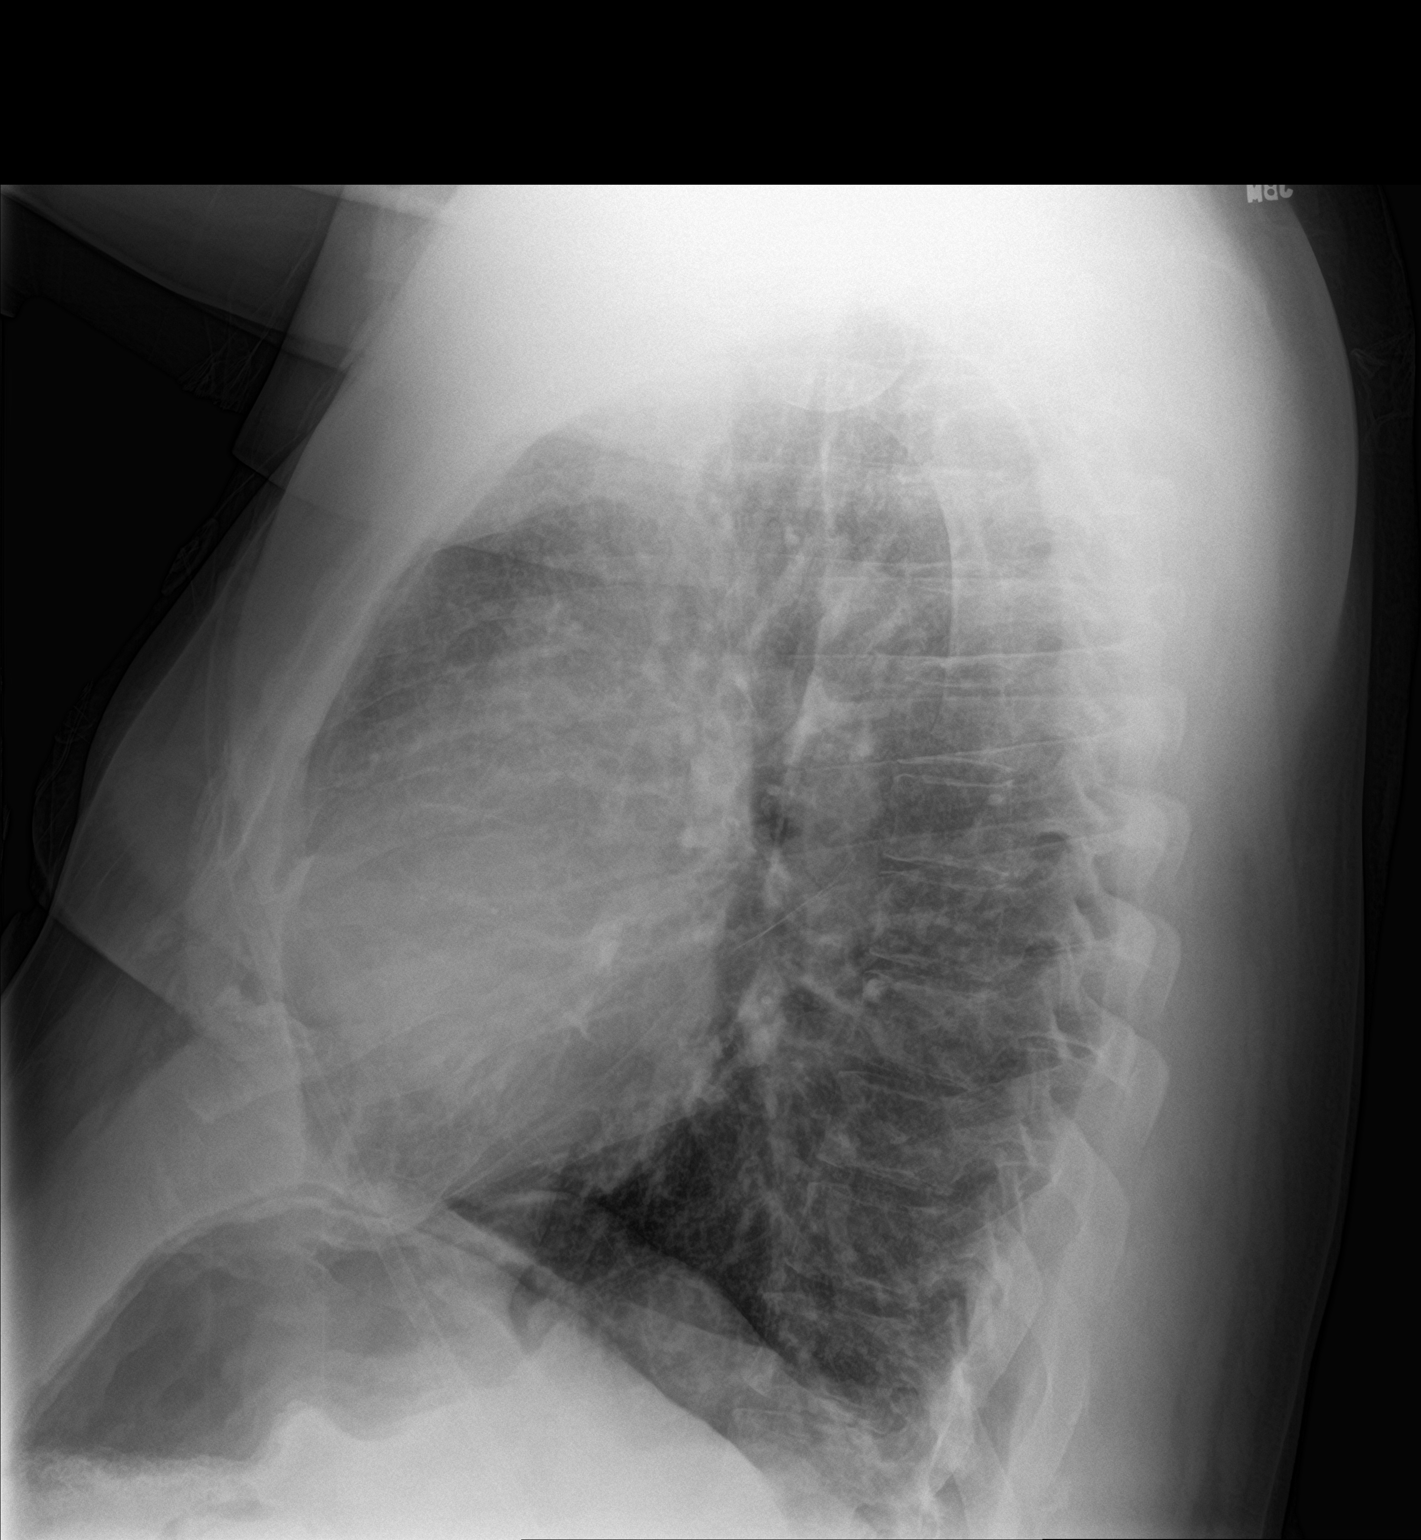

[2 of 2 positions shown; findings below may reference images not displayed]

FINDINGS: There is no edema or consolidation. Heart remains borderline
prominent. There is a degree of pulmonary venous hypertension,
stable. No adenopathy. No bone lesions.
IMPRESSION: Persistent degree of pulmonary vascular congestion without edema or
consolidation. Essentially no change from 1 day prior. No new
opacity.

## 2017-05-26 ENCOUNTER — Ambulatory Visit (INDEPENDENT_AMBULATORY_CARE_PROVIDER_SITE_OTHER): Payer: Medicare HMO | Admitting: Internal Medicine

## 2017-05-26 ENCOUNTER — Encounter: Payer: Self-pay | Admitting: Internal Medicine

## 2017-05-26 ENCOUNTER — Ambulatory Visit (INDEPENDENT_AMBULATORY_CARE_PROVIDER_SITE_OTHER)
Admission: RE | Admit: 2017-05-26 | Discharge: 2017-05-26 | Disposition: A | Discharge status: Home / Self Care | Payer: Medicare HMO | Source: Ambulatory Visit | Attending: Internal Medicine | Admitting: Internal Medicine

## 2017-05-26 VITALS — BP 110/68 | HR 107 | Ht 69.0 in | Wt 270.0 lb

## 2017-05-26 DIAGNOSIS — F17210 Nicotine dependence, cigarettes, uncomplicated: Secondary | ICD-10-CM | POA: Diagnosis not present

## 2017-05-26 DIAGNOSIS — J449 Chronic obstructive pulmonary disease, unspecified: Secondary | ICD-10-CM | POA: Diagnosis not present

## 2017-05-26 DIAGNOSIS — R69 Illness, unspecified: Secondary | ICD-10-CM | POA: Diagnosis not present

## 2017-05-26 DIAGNOSIS — J9611 Chronic respiratory failure with hypoxia: Secondary | ICD-10-CM

## 2017-05-26 DIAGNOSIS — J9612 Chronic respiratory failure with hypercapnia: Secondary | ICD-10-CM

## 2017-05-26 DIAGNOSIS — J441 Chronic obstructive pulmonary disease with (acute) exacerbation: Secondary | ICD-10-CM

## 2017-05-26 DIAGNOSIS — R05 Cough: Secondary | ICD-10-CM | POA: Diagnosis not present

## 2017-05-26 DIAGNOSIS — R0602 Shortness of breath: Secondary | ICD-10-CM | POA: Diagnosis not present

## 2017-05-26 MED ORDER — PREDNISONE 10 MG PO TABS: ORAL_TABLET | ORAL | 0 refills | Status: DC

## 2017-05-26 MED ORDER — BUDESONIDE-FORMOTEROL FUMARATE 160-4.5 MCG/ACT IN AERO: INHALATION_SPRAY | RESPIRATORY_TRACT | 0 refills | Status: DC

## 2017-05-26 NOTE — Progress Notes (Signed)
Spoke with pt and notified of results per Dr. [name]. Pt verbalized understanding and denied any questions. 

## 2017-05-26 NOTE — Progress Notes (Signed)
Subjective:    Patient ID: Chris [name], male    DOB: 1966/07/08   MRN: [id number]    Brief patient profile:  89 yobm active smoker with HIV  and doe x 2009 much worse since March 2014 so referred by [name] to pulmonary clinic 02/15/2013 with GOLD III copd by pfts 04/28/13.   History of Present Illness  02/15/2013 1st pulmonary eval  Cc indolent onset progressive doe x walking slow pace more than 100 ft,  not at rest., 02 dep, doe much worse x 3 months assoc with congested cough esp in am with sev tbsp thick white mucus takes about 30 min to clear.  No better on inhalers tried to date including advair and spiriva - albuterol works the best and using lots of saba and nebs rec Stop advair and spiriva Plan A = Automatic = Start symbicort 160 Take 2 puffs first thing in am and then another 2 puffs about 12 hours later.  Plan B = backup= Only use your albuterol (as a rescue medication to be used if you can't catch your breath by resting or doing a relaxed purse lip breathing pattern. The less you use it, the better it will work when you need it. Ok to use up to 2 puffs every 4 hours Plan C = nebulizer, use this only if plan B doesn't work ok to use up to 4 hours     04/28/2013 f/u ov/Chris [name] still smoking  re:  GOLD III/ 02 dep @ 2lpm  Chief Complaint  Patient presents with  . Follow-up    Pt states that his breathing has improved, symbicort seems to be helping some. No new co's today. Using proair approx 4 times per wk.   still doe but has learned to pace himself and overall better but somewhat limited from desired activities even on his best days rec Plan A = Automatic =  symbicort 160 Take 2 puffs first thing in am and then another 2 puffs about 12 hours later.  Add spiriva one capsule daily each am Plan B = backup= Only use your albuterol (proiare) as a rescue medication to be used if you can't catch your breath by resting or doing a relaxed purse lip breathing pattern. The less you use it, the  better it will work when you need it. Ok to use up to 2 puffs every 4 hours Plan C = nebulizer, use this only if plan B doesn't work after 15 min ok to use up to 4 hours   03/14/2014 Follow up and Medication review   rec Use medical med calendar        09/25/2016  Extended summary/ final f/u ov/Chris [name] re:  GOLD  III copd/ 02 2lpm prn / symb 160 2bid/spiriva  Chief Complaint  Patient presents with  . Follow-up    Breathing seems to be improving. He is using albuterol inhaler once per wk on average.   doe = MMRC2 = can't walk a nl pace on a flat grade s sob but does fine slow and flat eg does HT shopping ok on 2lpm  Rare need for saba hfa/ hardly ever needs neb now rec Plan A = Automatic =  symbicort 160 Take 2 puffs first thing in am and then another 2 puffs about 12 hours later.  Incruse one click each am - take two drags to get it all  Plan B = Backup Only use your albuterol as a rescue medication Plan C = Crisis - only use your albuterol nebulizer if you first try Plan B The key is to stop smoking completely before smoking completely stops you!    10/14/16   NP ov Rec Stop INCRUSE  Begin Spiriva Handihaler 1 puff daily .  Continue on Symbicort Twice daily. , brush rinse and gargle after use.  Important to quit smoking  Chest xray today .  Doxycycline 152m Twice daily  For 1 week , take with food.  Zyrtec 150mAt bedtime  As needed  Drainage  Mucinex DM Twice daily As needed  Cough/congestion  Continue on O2 2l/m .     12/11/2016  f/u ov/Chris [name] re:  COPD  GOLD III/ symb / spiriva dpiand 02 2- lpm and still smoking Chief Complaint  Patient presents with  . Follow-up    Pt states his breathing seems worse on Incruse so he has started back on spiriva b/c he had some left over. He is using albuterol inhaler 2 x daily on average and neb 3 x per wk on average.   doe = MMRC2 = can't walk a nl pace on a flat grade s sob but does fine slow and flat  eg [employer] on  RA rec Change to spiriva respimat 2 pffs each am  The key is to stop smoking completely before smoking completely stops you!  Weight control is simply a matter of calorie balance    05/26/2017  Acute  ov/Chris [name] re:   COPD III/ symb/spiriva rare saba / 02 2pm hs then during day prn  Chief Complaint  Patient presents with  . Acute Visit    Increased DOE x 3 days.   most days gets up 6 am with sob/some congestion > symb x 2 and then spiriva and saba and maybe once a week with saba hfa / did not bring it with in  Then worse gradually x 3 days, waking up 4am not using rescue then  Not much cough-   does not bother sleep until 4 am but even then minimal vol of mucus/ white  Needing 02 2lpm 24/7 now  On PPI bid ac    No obvious day to day or daytime variability or assoc excess/ purulent sputum or mucus plugs or hemoptysis or cp or chest tightness, subjective wheeze or overt sinus or hb symptoms. No unusual exp hx or h/o childhood pna/ asthma or knowledge of premature birth.  Sleeping ok flat without nocturnal  or early am exacerbation  of respiratory  c/o's or need for noct saba. Also denies any obvious fluctuation of symptoms with weather or environmental changes or other aggravating or alleviating factors except as outlined above   Current Allergies, Complete Past Medical History, Past Surgical History, Family History, and Social History were reviewed in [employer].  ROS  The following are not active complaints unless bolded Hoarseness, sore throat, dysphagia, dental problems, itching, sneezing,  nasal congestion or discharge of excess mucus or purulent secretions, ear ache,   fever, chills, sweats, unintended wt loss or wt gain, classically pleuritic or exertional cp,  orthopnea pnd or leg swelling, presyncope, palpitations, abdominal pain, anorexia, nausea, vomiting, diarrhea  or change in bowel habits or change in bladder habits, change in stools  or change in urine, dysuria, hematuria,  rash, arthralgias, visual complaints, headache, numbness, weakness  or ataxia or problems with walking or coordination,  change in mood/affect or memory.        Current Meds  Medication Sig  . ACCU-CHEK SOFTCLIX LANCETS lancets Use as instructed  . acetaminophen-codeine (TYLENOL #3) 300-30 MG tablet Take 1 tablet by mouth every 8 (eight) hours as needed for moderate pain.  [name] albuterol (PROVENTIL HFA;VENTOLIN HFA) 108 (90 Base) MCG/ACT inhaler Inhale 2 puffs into the lungs every 4 (four) hours as needed for wheezing or shortness of breath (((PLAN B))).  [name] atorvastatin (LIPITOR) 20 MG tablet Take 1 tablet (20 mg total) by mouth daily.  . bictegravir-emtricitabine-tenofovir AF (BIKTARVY) 50-200-25 MG TABS tablet Take 1 tablet by mouth daily.  . Blood Glucose Monitoring Suppl (ACCU-CHEK AVIVA PLUS) W/DEVICE KIT 250 Test up to 4 times daily  . budesonide-formoterol (SYMBICORT) 160-4.5 MCG/ACT inhaler Take 2 puffs first thing in am and then another 2 puffs about 12 hours later.  . clotrimazole (LOTRIMIN) 1 % cream Apply 1 application topically 2 (two) times daily.  . EZ SMART BLOOD GLUCOSE LANCETS MISC 1 Units by Does not apply route 4 (four) times daily.  [name] glucose blood test strip Use as instructed  . insulin glargine (LANTUS) 100 UNIT/ML injection Inject 0.25 mLs (25 Units total) into the skin every morning.  . Insulin Pen Needle 32G X 5 MM MISC Use as directed  . Insulin Syringe-Needle U-100 (BD INSULIN SYRINGE ULTRAFINE) 31G X 15/64" 0.5 ML MISC Use as directed  . ipratropium-albuterol (DUONEB) 0.5-2.5 (3) MG/3ML SOLN Take 3 mLs by nebulization every 6 (six) hours as needed. (Patient taking differently: Take 3 mLs by nebulization every 6 (six) hours as needed (shortness of breath). )  . metFORMIN (GLUCOPHAGE) 1000 MG tablet Take 1 tablet (1,000 mg total) by mouth 2 (two) times daily with a meal.  . OXYGEN Place 2 L/min into the nose as needed.   .  pantoprazole (PROTONIX) 40 MG tablet Take 1 tablet (40 mg total) by mouth 2 (two) times daily before a meal.  . sildenafil (VIAGRA) 50 MG tablet Take 0.5 tablets (25 mg total) by mouth daily as needed for erectile dysfunction.  . Tiotropium Bromide Monohydrate (SPIRIVA RESPIMAT) 2.5 MCG/ACT AERS Inhale 2 puffs into the lungs daily.  . [  budesonide-formoterol (SYMBICORT) 160-4.5 MCG/ACT inhaler Take 2 puffs first thing in am and then another 2 puffs about 12 hours later.                               Objective:   Physical Exam   amb obese bm nad / vital signs reviewed  - Note on arrival 02 sats  98% on 2lpm    01/05/2015       278 >  02/29/2016  274 > 06/17/2016 277 > 09/25/2016   258  > 12/11/2016   270  > 05/26/2017  270  01/28/2016        272   05/16/14 285 lb 6.4 oz (129.457 kg)  04/20/14 282 lb 12.8 oz (128.277 kg)  03/14/14 274 lb 3.2 oz (124.376 kg)     HEENT: nl dentition, turbinates bilaterally, and oropharynx. Nl external ear canals without cough reflex   NECK :  without JVD/Nodes/TM/ nl carotid upstrokes bilaterally   LUNGS: no acc muscle use,   Distant bs bilaterally with faint late exp wheezes    CV:  RRR  no s3 or murmur or increase in P2, and no edema  ABD: quite obese  soft and nontender with nl inspiratory excursion in the supine position. No bruits or organomegaly appreciated, bowel sounds nl  MS:  Nl gait/ ext warm without deformities, calf tenderness, cyanosis or clubbing No obvious joint restrictions   SKIN: warm and dry without lesions    NEURO:  alert, approp, nl sensorium with  no motor or cerebellar deficits apparent.     CXR PA and Lateral:   05/26/2017 :    I personally reviewed images and agree with radiology impression as follows:    1. Hyperaeration.  No active lung disease.  Question bronchitis. 2. Stable mild cardiomegaly         Assessment & Plan:

## 2017-05-26 NOTE — Patient Instructions (Addendum)
Plan A = Automatic =  symbicort 160 Take 2 puffs first thing in am and then another 2 puffs about 12 hours later.                                       spiriva 2 puffs    Work on inhaler technique:  relax and gently blow all the way out then take a nice smooth deep breath back in, triggering the inhaler at same time you start breathing in.  Hold for up to 5 seconds if you can. Blow out thru nose. Rinse and gargle with water when done      Plan B = Backup Only use your albuterol as a rescue medication to be used if you can't catch your breath by resting or doing a relaxed purse lip breathing pattern.  - The less you use it, the better it will work when you need it. - Ok to use the inhaler up to 2 puffs  every 4 hours if you must but call for appointment if use goes up over your usual need - Don't leave home without it !!  (think of it like the spare tire for your car)   Plan C = Crisis - only use your albuterol nebulizer if you first try Plan B and it fails to help > ok to use the nebulizer up to every 4 hours but if start needing it regularly call for immediate appointment  The key is to stop smoking completely before smoking completely stops you!    Please remember to go to the  x-ray department downstairs in the basement  for your tests - we will call you with the results when they are available.      Prednisone 10 mg take  4 each am x 2 days,   2 each am x 2 days,  1 each am x 2 days and stop    Please schedule a follow up visit in 3 months but call sooner if needed

## 2017-05-27 ENCOUNTER — Ambulatory Visit: Payer: Medicare Other | Admitting: Internal Medicine

## 2017-05-27 NOTE — Assessment & Plan Note (Addendum)
>   3 min Discussed the risks and costs (both direct and indirect)  of smoking relative to the benefits of quitting but patient unwilling to commit at this point to a specific quit date.    Although I don't endorse regular use of e cigs/ many pts find them helpful; however, I emphasized they should be considered a "one-way bridge" off all tobacco products.  

## 2017-05-27 NOTE — Assessment & Plan Note (Addendum)
-   04/28/2013 PFT's FEV1 1.21 (39%) ratio 59 and 13% better p B2 and dlco 72 corrects to 102  - started spiriva 04/30/2013 > changed to respimat 02/28/2014 -Med calendar 03/14/2014 > not using as of 05/16/14  - arrived on stiolto / symbicort 02/29/2016 > changed to symbicort 80/spiriva (lower dose ICS due to interaction with aids meds  - PFT's  06/17/2016  FEV1 1.29 (42 % ) ratio 64  p 43 % improvement from saba p symb 80 /spiriva prior to study with DLCO  51/49c % corrects to 86  % for alv volume   - 06/17/2016   change symb to 160 2bid  - 09/25/2016  After extensive coaching device  effectiveness =    90% with dpi/ elipta > changed to Incruse per Insurance restrictions > preferred spiriva - 12/11/2016   changed to spiriva respimat   05/26/2017  After extensive coaching HFA effectiveness =  90%     Despite good HFA technique is having a mild flare without evidence of bacterial infection or sinusitis and therefore recommended a short course of prednisone only and reviewed with him again how to take his medicines both as maintenance and prn's.  I had an extended discussion with the patient reviewing all relevant studies completed to date and  lasting 15 to 20 minutes of a 25 minute acute office visit    Each maintenance medication was reviewed in detail including most importantly the difference between maintenance and prns and under what circumstances the prns are to be triggered using an action plan format that is not reflected in the computer generated alphabetically organized AVS.    Please see AVS for specific instructions unique to this visit that I personally wrote and verbalized to the the pt in detail and then reviewed with pt  by my nurse highlighting any  changes in therapy recommended at today's visit to their plan of care.

## 2017-05-27 NOTE — Assessment & Plan Note (Signed)
-    HCO3 31 01/17/16 - 01/05/2015  Walked RA x 3 laps @ 185 ft each stopped due to  Leg pain and fatibue, no sob , nl pace, no desat  -  12/11/2016 Patient Saturations on Room Air at Rest = 88%----increased 98% 2lpm continuous  rec as of 05/27/2017  = 2lpm 24/7

## 2017-05-29 IMAGING — RF DG ESOPHAGUS
10 of 12 series · 12 of 15 positions shown · non-contrast
Comparison: Chest CT 12/12/2013

CLINICAL DATA: Solid food dysphagia. Heartburn and epigastric pain.
Per report, EGD is planned.

EXAM:
ESOPHOGRAM / BARIUM SWALLOW / BARIUM TABLET STUDY
TECHNIQUE: Combined double contrast and single contrast examination performed
using effervescent crystals, thick barium liquid, and thin barium
liquid. The patient was observed with fluoroscopy swallowing a 13 mm
barium sulphate tablet.
FLUOROSCOPY TIME:  Fluoroscopy Time:  0.9 minutes
Radiation Exposure Index (if provided by the fluoroscopic device):
9.9 mGy
Number of Acquired Spot Images: 0

[Series 1: cp_standard · 0.34mm/px · 3 of 27 frames shown (1 of 10)]
[frame 3/27]
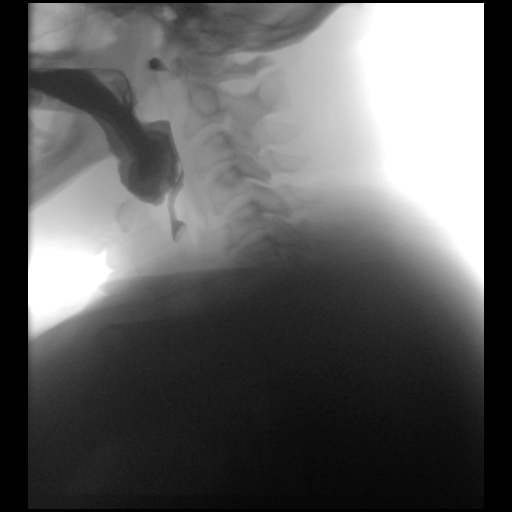
[frame 5/27]
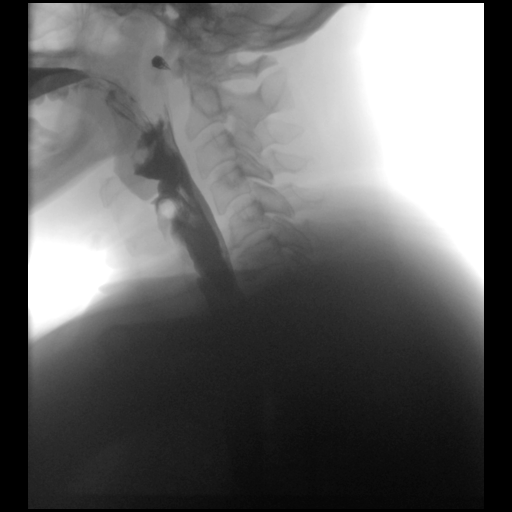
[frame 23/27]
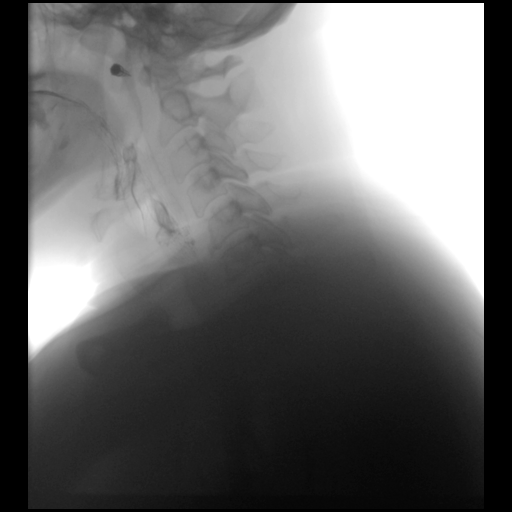

[Series 2: cp_standard · 0.26mm/px · 1 of 1 slices shown (2 of 10)]
[im 1/1]
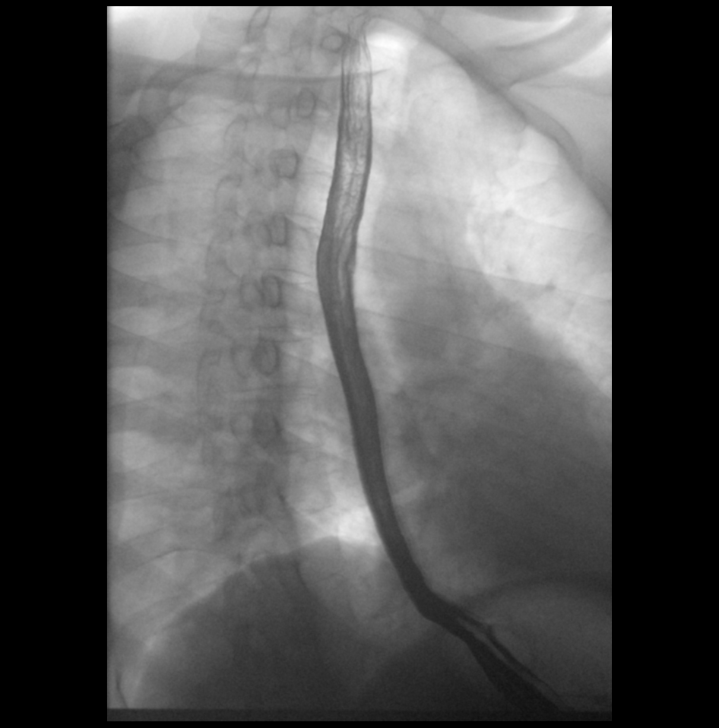

[Series 3: cp_standard · 0.26mm/px · 1 of 1 slices shown (3 of 10)]
[im 1/1]
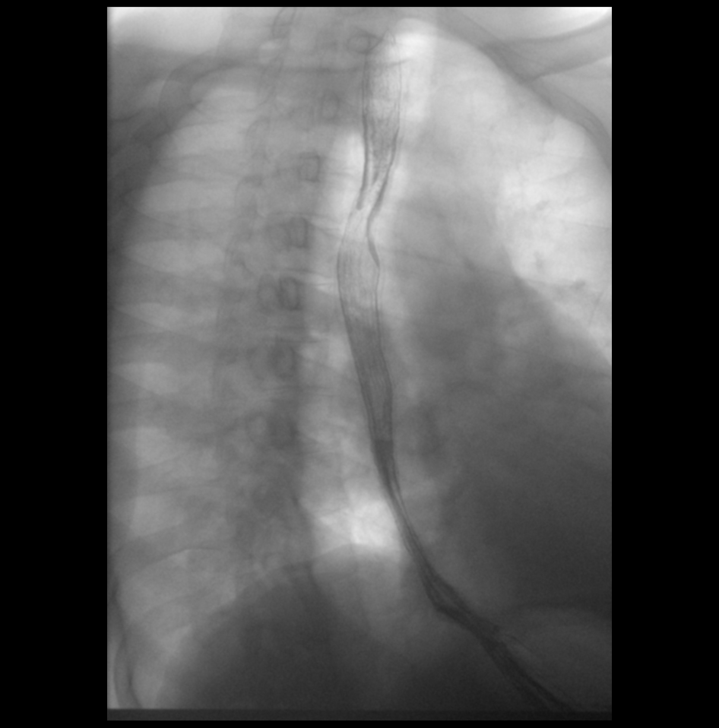

[Series 4: cp_standard · 0.26mm/px · 1 of 1 slices shown (4 of 10)]
[im 1/1]
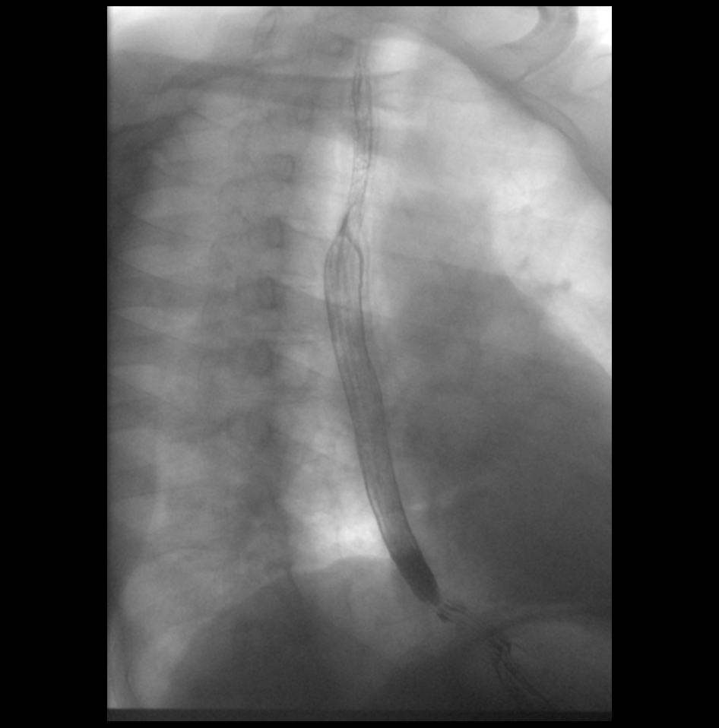

[Series 6: cp_standard · 0.26mm/px · 1 of 1 slices shown (5 of 10)]
[im 1/1]
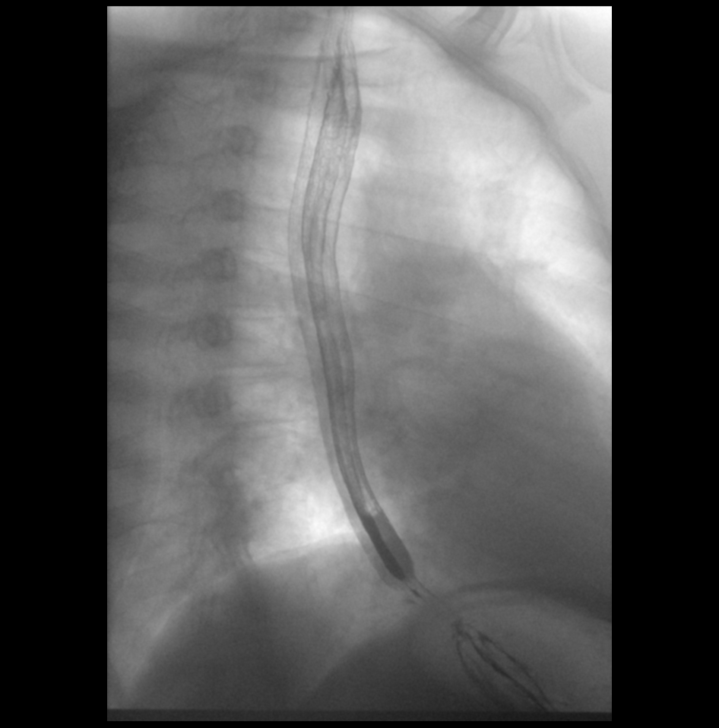

[Series 7: cp_standard · 0.17mm/px · 1 of 1 slices shown (6 of 10)]
[im 1/1]
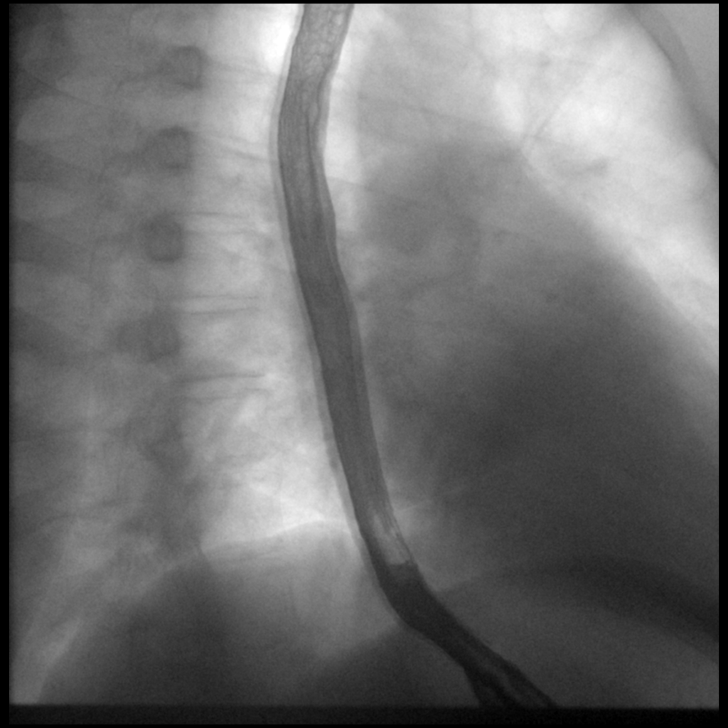

[Series 8: cp_standard · 0.17mm/px · 1 of 1 slices shown (7 of 10)]
[im 1/1]
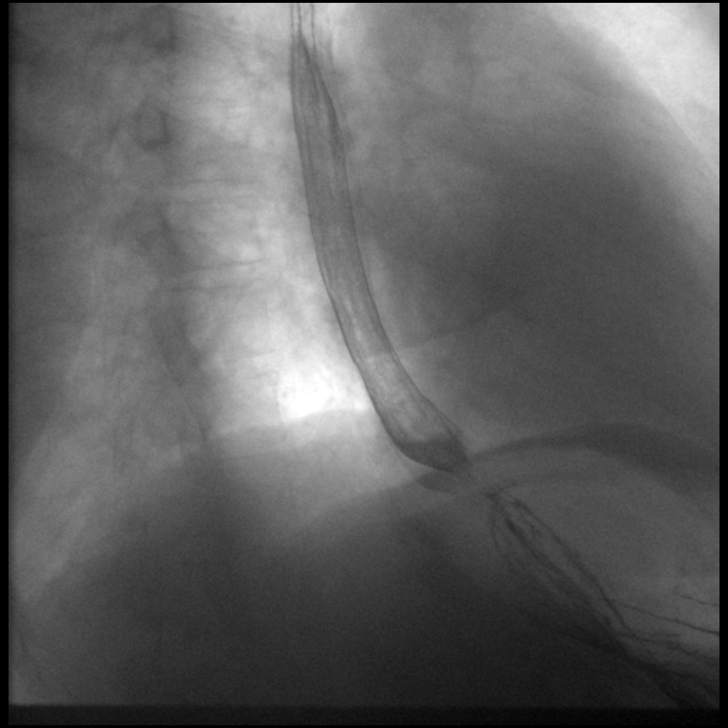

[Series 9: cp_standard · 0.17mm/px · 1 of 1 slices shown (8 of 10)]
[im 1/1]
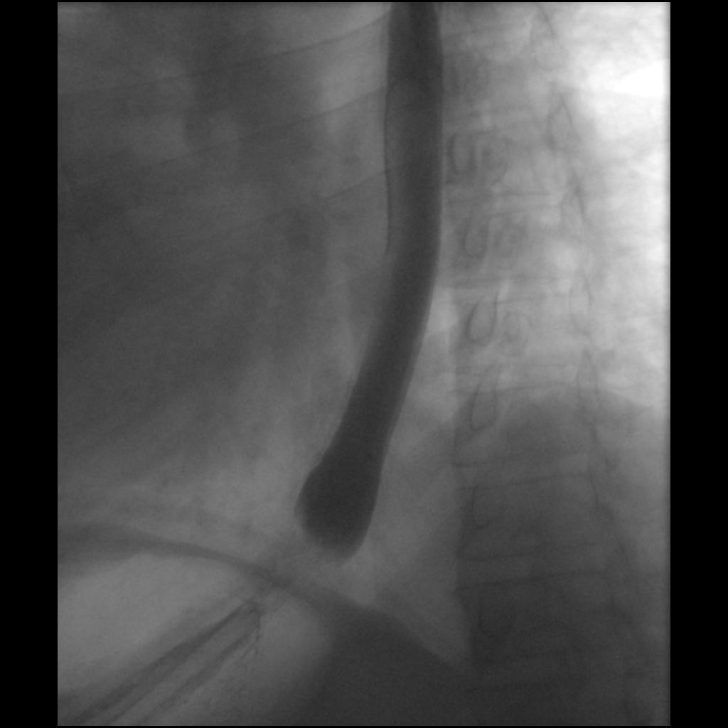

[Series 11: cp_standard · 0.17mm/px · 1 of 1 slices shown (9 of 10)]
[im 1/1]
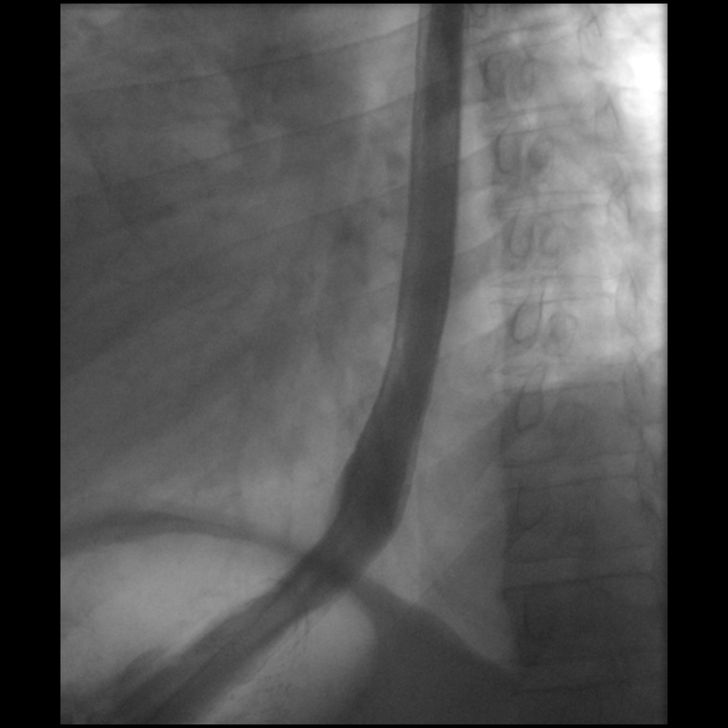

[Series 12: cp_standard · 0.17mm/px · 1 of 1 slices shown (10 of 10)]
[im 1/1]
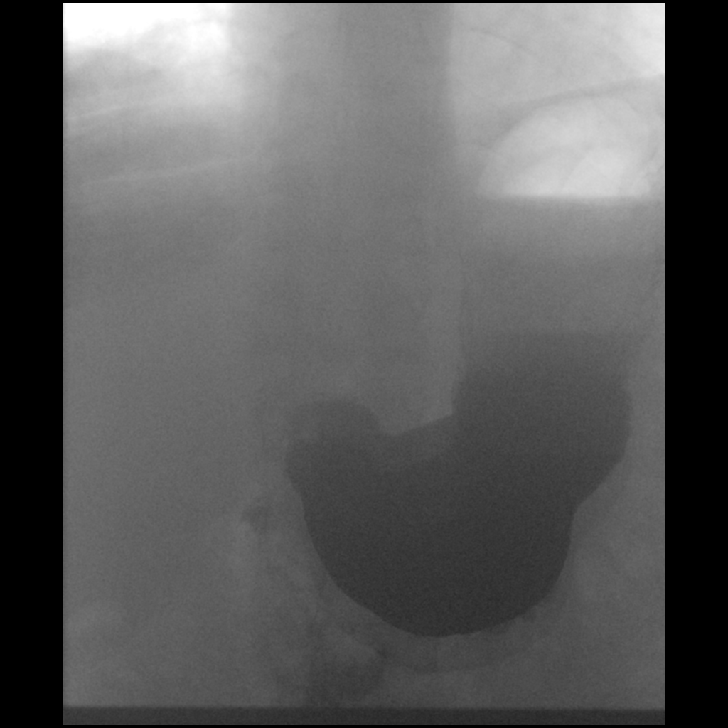

[12 of 15 positions shown; findings below may reference images not displayed]

FINDINGS: Lateral pharyngeal imaging shows no obstructive process or
aspiration.

The esophagus has normal distensibility. No stricture was
encountered and a 13 mm barium tablet easily traversed the
esophagus. Feline esophagus was seen intermittently. There is a fine
reticular mucosal pattern suggested along the lower esophagus.
Suspect reflux esophagitis with Barrett's esophagus not excluded. No
hiatal hernia. Esophageal motility was normal.
IMPRESSION: 1. Suspect reflux esophagitis.  Barrett's esophagus is not excluded.
2. Negative for stricture.

## 2017-06-01 ENCOUNTER — Ambulatory Visit: Payer: Medicare HMO | Attending: Internal Medicine | Admitting: Internal Medicine

## 2017-06-01 ENCOUNTER — Encounter: Payer: Self-pay | Admitting: Internal Medicine

## 2017-06-01 VITALS — BP 128/81 | HR 89 | Temp 97.9°F | Resp 18 | Ht 69.0 in | Wt 266.0 lb

## 2017-06-01 DIAGNOSIS — F329 Major depressive disorder, single episode, unspecified: Secondary | ICD-10-CM | POA: Diagnosis not present

## 2017-06-01 DIAGNOSIS — Z79899 Other long term (current) drug therapy: Secondary | ICD-10-CM | POA: Diagnosis not present

## 2017-06-01 DIAGNOSIS — J449 Chronic obstructive pulmonary disease, unspecified: Secondary | ICD-10-CM | POA: Insufficient documentation

## 2017-06-01 DIAGNOSIS — E118 Type 2 diabetes mellitus with unspecified complications: Secondary | ICD-10-CM | POA: Diagnosis not present

## 2017-06-01 DIAGNOSIS — K219 Gastro-esophageal reflux disease without esophagitis: Secondary | ICD-10-CM | POA: Diagnosis not present

## 2017-06-01 DIAGNOSIS — Z9889 Other specified postprocedural states: Secondary | ICD-10-CM | POA: Diagnosis not present

## 2017-06-01 DIAGNOSIS — K029 Dental caries, unspecified: Secondary | ICD-10-CM | POA: Diagnosis not present

## 2017-06-01 DIAGNOSIS — E1165 Type 2 diabetes mellitus with hyperglycemia: Secondary | ICD-10-CM | POA: Insufficient documentation

## 2017-06-01 DIAGNOSIS — Z7952 Long term (current) use of systemic steroids: Secondary | ICD-10-CM | POA: Insufficient documentation

## 2017-06-01 DIAGNOSIS — Z794 Long term (current) use of insulin: Secondary | ICD-10-CM | POA: Diagnosis not present

## 2017-06-01 DIAGNOSIS — Z9981 Dependence on supplemental oxygen: Secondary | ICD-10-CM | POA: Diagnosis not present

## 2017-06-01 DIAGNOSIS — R69 Illness, unspecified: Secondary | ICD-10-CM | POA: Diagnosis not present

## 2017-06-01 DIAGNOSIS — I10 Essential (primary) hypertension: Secondary | ICD-10-CM | POA: Diagnosis not present

## 2017-06-01 DIAGNOSIS — B20 Human immunodeficiency virus [HIV] disease: Secondary | ICD-10-CM | POA: Insufficient documentation

## 2017-06-01 LAB — POCT GLYCOSYLATED HEMOGLOBIN (HGB A1C): Hemoglobin A1C: 8.8

## 2017-06-01 LAB — GLUCOSE, POCT (MANUAL RESULT ENTRY): POC Glucose: 284 mg/dl — AB (ref 70–99)

## 2017-06-01 NOTE — Patient Instructions (Addendum)
Diabetes and Foot Care Diabetes may cause you to have problems because of poor blood supply (circulation) to your feet and legs. This may cause the skin on your feet to become thinner, break easier, and heal more slowly. Your skin may become dry, and the skin may peel and crack. You may also have nerve damage in your legs and feet causing decreased feeling in them. You may not notice minor injuries to your feet that could lead to infections or more serious problems. Taking care of your feet is one of the most important things you can do for yourself. Follow these instructions at home:  Wear shoes at all times, even in the house. Do not go barefoot. Bare feet are easily injured.  Check your feet daily for blisters, cuts, and redness. If you cannot see the bottom of your feet, use a mirror or ask someone for help.  Wash your feet with warm water (do not use hot water) and mild soap. Then pat your feet and the areas between your toes until they are completely dry. Do not soak your feet as this can dry your skin.  Apply a moisturizing lotion or petroleum jelly (that does not contain alcohol and is unscented) to the skin on your feet and to dry, brittle toenails. Do not apply lotion between your toes.  Trim your toenails straight across. Do not dig under them or around the cuticle. File the edges of your nails with an emery board or nail file.  Do not cut corns or calluses or try to remove them with medicine.  Wear clean socks or stockings every day. Make sure they are not too tight. Do not wear knee-high stockings since they may decrease blood flow to your legs.  Wear shoes that fit properly and have enough cushioning. To break in new shoes, wear them for just a few hours a day. This prevents you from injuring your feet. Always look in your shoes before you put them on to be sure there are no objects inside.  Do not cross your legs. This may decrease the blood flow to your feet.  If you find a  minor scrape, cut, or break in the skin on your feet, keep it and the skin around it clean and dry. These areas may be cleansed with mild soap and water. Do not cleanse the area with peroxide, alcohol, or iodine.  When you remove an adhesive bandage, be sure not to damage the skin around it.  If you have a wound, look at it several times a day to make sure it is healing.  Do not use heating pads or hot water bottles. They may burn your skin. If you have lost feeling in your feet or legs, you may not know it is happening until it is too late.  Make sure your health care provider performs a complete foot exam at least annually or more often if you have foot problems. Report any cuts, sores, or bruises to your health care provider immediately. Contact a health care provider if:  You have an injury that is not healing.  You have cuts or breaks in the skin.  You have an ingrown nail.  You notice redness on your legs or feet.  You feel burning or tingling in your legs or feet.  You have pain or cramps in your legs and feet.  Your legs or feet are numb.  Your feet always feel cold. Get help right away if:  There is increasing   redness, swelling, or pain in or around a wound.  There is a red line that goes up your leg.  Pus is coming from a wound.  You develop a fever or as directed by your health care provider.  You notice a bad smell coming from an ulcer or wound. This information is not intended to replace advice given to you by your health care provider. Make sure you discuss any questions you have with your health care provider. Document Released: 07/25/2000 Document Revised: 01/03/2016 Document Reviewed: 01/04/2013 Elsevier Interactive Patient Education  2017 [employer] Diabetes Mellitus and Exercise Exercising regularly is important for your overall health, especially when you have diabetes (diabetes mellitus). Exercising is not only about losing weight. It has many health  benefits, such as increasing muscle strength and bone density and reducing body fat and stress. This leads to improved fitness, flexibility, and endurance, all of which result in better overall health. Exercise has additional benefits for people with diabetes, including:  Reducing appetite.  Helping to lower and control blood glucose.  Lowering blood pressure.  Helping to control amounts of fatty substances (lipids) in the blood, such as cholesterol and triglycerides.  Helping the body to respond better to insulin (improving insulin sensitivity).  Reducing how much insulin the body needs.  Decreasing the risk for heart disease by: ? Lowering cholesterol and triglyceride levels. ? Increasing the levels of good cholesterol. ? Lowering blood glucose levels.  What is my activity plan? Your health care provider or certified diabetes educator can help you make a plan for the type and frequency of exercise (activity plan) that works for you. Make sure that you:  Do at least 150 minutes of moderate-intensity or vigorous-intensity exercise each week. This could be brisk walking, biking, or water aerobics. ? Do stretching and strength exercises, such as yoga or weightlifting, at least 2 times a week. ? Spread out your activity over at least 3 days of the week.  Get some form of physical activity every day. ? Do not go more than 2 days in a row without some kind of physical activity. ? Avoid being inactive for more than 90 minutes at a time. Take frequent breaks to walk or stretch.  Choose a type of exercise or activity that you enjoy, and set realistic goals.  Start slowly, and gradually increase the intensity of your exercise over time.  What do I need to know about managing my diabetes?  Check your blood glucose before and after exercising. ? If your blood glucose is higher than 240 mg/dL (13.3 mmol/L) before you exercise, check your urine for ketones. If you have ketones in your urine,  do not exercise until your blood glucose returns to normal.  Know the symptoms of low blood glucose (hypoglycemia) and how to treat it. Your risk for hypoglycemia increases during and after exercise. Common symptoms of hypoglycemia can include: ? Hunger. ? Anxiety. ? Sweating and feeling clammy. ? Confusion. ? Dizziness or feeling light-headed. ? Increased heart rate or palpitations. ? Blurry vision. ? Tingling or numbness around the mouth, lips, or tongue. ? Tremors or shakes. ? Irritability.  Keep a rapid-acting carbohydrate snack available before, during, and after exercise to help prevent or treat hypoglycemia.  Avoid injecting insulin into areas of the body that are going to be exercised. For example, avoid injecting insulin into: ? The arms, when playing tennis. ? The legs, when jogging.  Keep records of your exercise habits. Doing this can help you and   your health care provider adjust your diabetes management plan as needed. Write down: ? Food that you eat before and after you exercise. ? Blood glucose levels before and after you exercise. ? The type and amount of exercise you have done. ? When your insulin is expected to peak, if you use insulin. Avoid exercising at times when your insulin is peaking.  When you start a new exercise or activity, work with your health care provider to make sure the activity is safe for you, and to adjust your insulin, medicines, or food intake as needed.  Drink plenty of water while you exercise to prevent dehydration or heat stroke. Drink enough fluid to keep your urine clear or pale yellow. This information is not intended to replace advice given to you by your health care provider. Make sure you discuss any questions you have with your health care provider. Document Released: 10/18/2003 Document Revised: 02/15/2016 Document Reviewed: 01/07/2016 Elsevier Interactive Patient Education  2018 [employer] Blood Glucose Monitoring,  Adult Monitoring your blood sugar (glucose) helps you manage your diabetes. It also helps you and your health care provider determine how well your diabetes management plan is working. Blood glucose monitoring involves checking your blood glucose as often as directed, and keeping a record (log) of your results over time. Why should I monitor my blood glucose? Checking your blood glucose regularly can:  Help you understand how food, exercise, illnesses, and medicines affect your blood glucose.  Let you know what your blood glucose is at any time. You can quickly tell if you are having low blood glucose (hypoglycemia) or high blood glucose (hyperglycemia).  Help you and your health care provider adjust your medicines as needed.  When should I check my blood glucose? Follow instructions from your health care provider about how often to check your blood glucose. This may depend on:  The type of diabetes you have.  How well-controlled your diabetes is.  Medicines you are taking.  If you have type 1 diabetes:  Check your blood glucose at least 2 times a day.  Also check your blood glucose: ? Before every insulin injection. ? Before and after exercise. ? Between meals. ? 2 hours after a meal. ? Occasionally between 2:00 a.m. and 3:00 a.m., as directed. ? Before potentially dangerous tasks, like driving or using heavy machinery. ? At bedtime.  You may need to check your blood glucose more often, up to 6-10 times a day: ? If you use an insulin pump. ? If you need multiple daily injections (MDI). ? If your diabetes is not well-controlled. ? If you are ill. ? If you have a history of severe hypoglycemia. ? If you have a history of not knowing when your blood glucose is getting low (hypoglycemia unawareness). If you have type 2 diabetes:  If you take insulin or other diabetes medicines, check your blood glucose at least 2 times a day.  If you are on intensive insulin therapy, check  your blood glucose at least 4 times a day. Occasionally, you may also need to check between 2:00 a.m. and 3:00 a.m., as directed.  Also check your blood glucose: ? Before and after exercise. ? Before potentially dangerous tasks, like driving or using heavy machinery.  You may need to check your blood glucose more often if: ? Your medicine is being adjusted. ? Your diabetes is not well-controlled. ? You are ill. What is a blood glucose log?  A blood glucose log is a record of   your blood glucose readings. It helps you and your health care provider: ? Look for patterns in your blood glucose over time. ? Adjust your diabetes management plan as needed.  Every time you check your blood glucose, write down your result and notes about things that may be affecting your blood glucose, such as your diet and exercise for the day.  Most glucose meters store a record of glucose readings in the meter. Some meters allow you to download your records to a computer. How do I check my blood glucose? Follow these steps to get accurate readings of your blood glucose: Supplies needed   Blood glucose meter.  Test strips for your meter. Each meter has its own strips. You must use the strips that come with your meter.  A needle to prick your finger (lancet). Do not use lancets more than once.  A device that holds the lancet (lancing device).  A journal or log book to write down your results. Procedure  Wash your hands with soap and water.  Prick the side of your finger (not the tip) with the lancet. Use a different finger each time.  Gently rub the finger until a small drop of blood appears.  Follow instructions that come with your meter for inserting the test strip, applying blood to the strip, and using your blood glucose meter.  Write down your result and any notes. Alternative testing sites  Some meters allow you to use areas of your body other than your finger (alternative sites) to test  your blood.  If you think you may have hypoglycemia, or if you have hypoglycemia unawareness, do not use alternative sites. Use your finger instead.  Alternative sites may not be as accurate as the fingers, because blood flow is slower in these areas. This means that the result you get may be delayed, and it may be different from the result that you would get from your finger.  The most common alternative sites are: ? Forearm. ? Thigh. ? Palm of the hand. Additional tips  Always keep your supplies with you.  If you have questions or need help, all blood glucose meters have a 24-hour "hotline" number that you can call. You may also contact your health care provider.  After you use a few boxes of test strips, adjust (calibrate) your blood glucose meter by following instructions that came with your meter. This information is not intended to replace advice given to you by your health care provider. Make sure you discuss any questions you have with your health care provider. Document Released: 07/31/2003 Document Revised: 02/15/2016 Document Reviewed: 01/07/2016 Elsevier Interactive Patient Education  2017 [employer]  Chronic Obstructive Pulmonary Disease Chronic obstructive pulmonary disease (COPD) is a long-term (chronic) lung problem. When you have COPD, it is hard for air to get in and out of your lungs. The way your lungs work will never return to normal. Usually the condition gets worse over time. There are things you can do to keep yourself as healthy as possible. Your doctor may treat your condition with:  Medicines.  Quitting smoking, if you smoke.  Rehabilitation. This may involve a team of specialists.  Oxygen.  Exercise and changes to your diet.  Lung surgery.  Comfort measures (palliative care).  Follow these instructions at home: Medicines  Take over-the-counter and prescription medicines only as told by your doctor.  Talk to your doctor before taking any cough  or allergy medicines. You may need to avoid medicines that cause  your lungs to be dry. Lifestyle  If you smoke, stop. Smoking makes the problem worse. If you need help quitting, ask your doctor.  Avoid being around things that make your breathing worse. This may include smoke, chemicals, and fumes.  Stay active, but remember to also rest.  Learn and use tips on how to relax.  Make sure you get enough sleep. Most adults need at least 7 hours a night.  Eat healthy foods. Eat smaller meals more often. Rest before meals. Controlled breathing  Learn and use tips on how to control your breathing as told by your doctor. Try: ? Breathing in (inhaling) through your nose for 1 second. Then, pucker your lips and breath out (exhale) through your lips for 2 seconds. ? Putting one hand on your belly (abdomen). Breathe in slowly through your nose for 1 second. Your hand on your belly should move out. Pucker your lips and breathe out slowly through your lips. Your hand on your belly should move in as you breathe out. Controlled coughing  Learn and use controlled coughing to clear mucus from your lungs. The steps are: 1. Lean your head a little forward. 2. Breathe in deeply. 3. Try to hold your breath for 3 seconds. 4. Keep your mouth slightly open while coughing 2 times. 5. Spit any mucus out into a tissue. 6. Rest and do the steps again 1 or 2 times as needed. General instructions  Make sure you get all the shots (vaccines) that your doctor recommends. Ask your doctor about a flu shot and a pneumonia shot.  Use oxygen therapy and therapy to help improve your lungs (pulmonary rehabilitation) if told by your doctor. If you need home oxygen therapy, ask your doctor if you should buy a tool to measure your oxygen level (oximeter).  Make a COPD action plan with your doctor. This helps you know what to do if you feel worse than usual.  Manage any other conditions you have as told by your  doctor.  Avoid going outside when it is very hot, cold, or humid.  Avoid people who have a sickness you can catch (contagious).  Keep all follow-up visits as told by your doctor. This is important. Contact a doctor if:  You cough up more mucus than usual.  There is a change in the color or thickness of the mucus.  It is harder to breathe than usual.  Your breathing is faster than usual.  You have trouble sleeping.  You need to use your medicines more often than usual.  You have trouble doing your normal activities such as getting dressed or walking around the house. Get help right away if:  You have shortness of breath while resting.  You have shortness of breath that stops you from: ? Being able to talk. ? Doing normal activities.  Your chest hurts for longer than 5 minutes.  Your skin color is more blue than usual.  Your pulse oximeter shows that you have low oxygen for longer than 5 minutes.  You have a fever.  You feel too tired to breathe normally. Summary  Chronic obstructive pulmonary disease (COPD) is a long-term lung problem.  The way your lungs work will never return to normal. Usually the condition gets worse over time. There are things you can do to keep yourself as healthy as possible.  Take over-the-counter and prescription medicines only as told by your doctor.  If you smoke, stop. Smoking makes the problem worse. This information is not  intended to replace advice given to you by your health care provider. Make sure you discuss any questions you have with your health care provider. Document Released: 01/14/2008 Document Revised: 01/03/2016 Document Reviewed: 03/24/2013 Elsevier Interactive Patient Education  2017 [employer]  Hypertension Hypertension, commonly called high blood pressure, is when the force of blood pumping through the arteries is too strong. The arteries are the blood vessels that carry blood from the heart throughout the body.  Hypertension forces the heart to work harder to pump blood and may cause arteries to become narrow or stiff. Having untreated or uncontrolled hypertension can cause heart attacks, strokes, kidney disease, and other problems. A blood pressure reading consists of a higher number over a lower number. Ideally, your blood pressure should be below 120/80. The first ("top") number is called the systolic pressure. It is a measure of the pressure in your arteries as your heart beats. The second ("bottom") number is called the diastolic pressure. It is a measure of the pressure in your arteries as the heart relaxes. What are the causes? The cause of this condition is not known. What increases the risk? Some risk factors for high blood pressure are under your control. Others are not. Factors you can change  Smoking.  Having type 2 diabetes mellitus, high cholesterol, or both.  Not getting enough exercise or physical activity.  Being overweight.  Having too much fat, sugar, calories, or salt (sodium) in your diet.  Drinking too much alcohol. Factors that are difficult or impossible to change  Having chronic kidney disease.  Having a family history of high blood pressure.  Age. Risk increases with age.  Race. You may be at higher risk if you are African-American.  Gender. Men are at higher risk than women before age 69. After age 49, women are at higher risk than men.  Having obstructive sleep apnea.  Stress. What are the signs or symptoms? Extremely high blood pressure (hypertensive crisis) may cause:  Headache.  Anxiety.  Shortness of breath.  Nosebleed.  Nausea and vomiting.  Severe chest pain.  Jerky movements you cannot control (seizures).  How is this diagnosed? This condition is diagnosed by measuring your blood pressure while you are seated, with your arm resting on a surface. The cuff of the blood pressure monitor will be placed directly against the skin of your upper  arm at the level of your heart. It should be measured at least twice using the same arm. Certain conditions can cause a difference in blood pressure between your right and left arms. Certain factors can cause blood pressure readings to be lower or higher than normal (elevated) for a short period of time:  When your blood pressure is higher when you are in a health care provider's office than when you are at home, this is called white coat hypertension. Most people with this condition do not need medicines.  When your blood pressure is higher at home than when you are in a health care provider's office, this is called masked hypertension. Most people with this condition may need medicines to control blood pressure.  If you have a high blood pressure reading during one visit or you have normal blood pressure with other risk factors:  You may be asked to return on a different day to have your blood pressure checked again.  You may be asked to monitor your blood pressure at home for 1 week or longer.  If you are diagnosed with hypertension, you may have  other blood or imaging tests to help your health care provider understand your overall risk for other conditions. How is this treated? This condition is treated by making healthy lifestyle changes, such as eating healthy foods, exercising more, and reducing your alcohol intake. Your health care provider may prescribe medicine if lifestyle changes are not enough to get your blood pressure under control, and if:  Your systolic blood pressure is above 130.  Your diastolic blood pressure is above 80.  Your personal target blood pressure may vary depending on your medical conditions, your age, and other factors. Follow these instructions at home: Eating and drinking  Eat a diet that is high in fiber and potassium, and low in sodium, added sugar, and fat. An example eating plan is called the DASH (Dietary Approaches to Stop Hypertension) diet. To eat  this way: ? Eat plenty of fresh fruits and vegetables. Try to fill half of your plate at each meal with fruits and vegetables. ? Eat whole grains, such as whole wheat pasta, brown rice, or whole grain bread. Fill about one quarter of your plate with whole grains. ? Eat or drink low-fat dairy products, such as skim milk or low-fat yogurt. ? Avoid fatty cuts of meat, processed or cured meats, and poultry with skin. Fill about one quarter of your plate with lean proteins, such as fish, chicken without skin, beans, eggs, and tofu. ? Avoid premade and processed foods. These tend to be higher in sodium, added sugar, and fat.  Reduce your daily sodium intake. Most people with hypertension should eat less than 1,500 mg of sodium a day.  Limit alcohol intake to no more than 1 drink a day for nonpregnant women and 2 drinks a day for men. One drink equals 12 oz of beer, 5 oz of wine, or 1 oz of hard liquor. Lifestyle  Work with your health care provider to maintain a healthy body weight or to lose weight. Ask what an ideal weight is for you.  Get at least 30 minutes of exercise that causes your heart to beat faster (aerobic exercise) most days of the week. Activities may include walking, swimming, or biking.  Include exercise to strengthen your muscles (resistance exercise), such as pilates or lifting weights, as part of your weekly exercise routine. Try to do these types of exercises for 30 minutes at least 3 days a week.  Do not use any products that contain nicotine or tobacco, such as cigarettes and e-cigarettes. If you need help quitting, ask your health care provider.  Monitor your blood pressure at home as told by your health care provider.  Keep all follow-up visits as told by your health care provider. This is important. Medicines  Take over-the-counter and prescription medicines only as told by your health care provider. Follow directions carefully. Blood pressure medicines must be taken as  prescribed.  Do not skip doses of blood pressure medicine. Doing this puts you at risk for problems and can make the medicine less effective.  Ask your health care provider about side effects or reactions to medicines that you should watch for. Contact a health care provider if:  You think you are having a reaction to a medicine you are taking.  You have headaches that keep coming back (recurring).  You feel dizzy.  You have swelling in your ankles.  You have trouble with your vision. Get help right away if:  You develop a severe headache or confusion.  You have unusual weakness or numbness.  You feel faint.  You have severe pain in your chest or abdomen.  You vomit repeatedly.  You have trouble breathing. Summary  Hypertension is when the force of blood pumping through your arteries is too strong. If this condition is not controlled, it may put you at risk for serious complications.  Your personal target blood pressure may vary depending on your medical conditions, your age, and other factors. For most people, a normal blood pressure is less than 120/80.  Hypertension is treated with lifestyle changes, medicines, or a combination of both. Lifestyle changes include weight loss, eating a healthy, low-sodium diet, exercising more, and limiting alcohol. This information is not intended to replace advice given to you by your health care provider. Make sure you discuss any questions you have with your health care provider. Document Released: 07/28/2005 Document Revised: 06/25/2016 Document Reviewed: 06/25/2016 Elsevier Interactive Patient Education  2018 Elsevier Inc.  
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
## 2017-06-07 ENCOUNTER — Emergency Department (HOSPITAL_COMMUNITY)
Admission: EM | Admit: 2017-06-07 | Discharge: 2017-06-07 | Disposition: A | Discharge status: Home / Self Care | Payer: Medicare HMO | Attending: Emergency Medicine | Admitting: Emergency Medicine

## 2017-06-07 ENCOUNTER — Encounter (HOSPITAL_COMMUNITY): Payer: Self-pay

## 2017-06-07 ENCOUNTER — Emergency Department (HOSPITAL_COMMUNITY): Payer: Medicare HMO

## 2017-06-07 DIAGNOSIS — E119 Type 2 diabetes mellitus without complications: Secondary | ICD-10-CM | POA: Diagnosis not present

## 2017-06-07 DIAGNOSIS — R69 Illness, unspecified: Secondary | ICD-10-CM | POA: Diagnosis not present

## 2017-06-07 DIAGNOSIS — Z79899 Other long term (current) drug therapy: Secondary | ICD-10-CM | POA: Diagnosis not present

## 2017-06-07 DIAGNOSIS — Z21 Asymptomatic human immunodeficiency virus [HIV] infection status: Secondary | ICD-10-CM | POA: Diagnosis not present

## 2017-06-07 DIAGNOSIS — J441 Chronic obstructive pulmonary disease with (acute) exacerbation: Secondary | ICD-10-CM

## 2017-06-07 DIAGNOSIS — J069 Acute upper respiratory infection, unspecified: Secondary | ICD-10-CM | POA: Diagnosis not present

## 2017-06-07 DIAGNOSIS — F17210 Nicotine dependence, cigarettes, uncomplicated: Secondary | ICD-10-CM | POA: Diagnosis not present

## 2017-06-07 DIAGNOSIS — R0602 Shortness of breath: Secondary | ICD-10-CM | POA: Diagnosis not present

## 2017-06-07 DIAGNOSIS — Z794 Long term (current) use of insulin: Secondary | ICD-10-CM | POA: Insufficient documentation

## 2017-06-07 LAB — BASIC METABOLIC PANEL
Anion gap: 7 (ref 5–15)
CALCIUM: 8.3 mg/dL — AB (ref 8.9–10.3)
CO2: 27 mmol/L (ref 22–32)
Chloride: 99 mmol/L — ABNORMAL LOW (ref 101–111)
Creatinine, Ser: 0.7 mg/dL (ref 0.61–1.24)
GFR calc Af Amer: >60 mL/min (ref >60)
GLUCOSE: 264 mg/dL — AB (ref 65–99)
POTASSIUM: 3.8 mmol/L (ref 3.5–5.1)
Sodium: 133 mmol/L — ABNORMAL LOW (ref 135–145)

## 2017-06-07 LAB — CBC
HEMATOCRIT: 46.4 % (ref 39.0–52.0)
Hemoglobin: 14.3 g/dL (ref 13.0–17.0)
MCH: 28.5 pg (ref 26.0–34.0)
MCHC: 30.8 g/dL (ref 30.0–36.0)
MCV: 92.4 fL (ref 78.0–100.0)
Platelets: 186 10*3/uL (ref 150–400)
RBC: 5.02 MIL/uL (ref 4.22–5.81)
RDW: 12.3 % (ref 11.5–15.5)
WBC: 6.7 10*3/uL (ref 4.0–10.5)

## 2017-06-07 MED ORDER — ALBUTEROL (5 MG/ML) CONTINUOUS INHALATION SOLN
5.0000 mg/h | INHALATION_SOLUTION | RESPIRATORY_TRACT | Status: DC
  Administered 2017-06-07: 5 mg/h via RESPIRATORY_TRACT
  Filled 2017-06-07 (×2): qty 0.5

## 2017-06-07 MED ORDER — ALBUTEROL (5 MG/ML) CONTINUOUS INHALATION SOLN
10.0000 mg/h | INHALATION_SOLUTION | RESPIRATORY_TRACT | Status: AC
  Administered 2017-06-07: 10 mg/h via RESPIRATORY_TRACT
  Filled 2017-06-07: qty 0.5

## 2017-06-07 MED ORDER — LEVOFLOXACIN 500 MG PO TABS: 500.0000 mg | ORAL_TABLET | Freq: Every day | ORAL | 0 refills | Status: DC

## 2017-06-07 MED ORDER — DEXAMETHASONE SODIUM PHOSPHATE 10 MG/ML IJ SOLN
10.0000 mg | Freq: Once | INTRAMUSCULAR | Status: AC
  Administered 2017-06-07: 10 mg via INTRAVENOUS
  Filled 2017-06-07: qty 1

## 2017-06-07 MED ORDER — SODIUM CHLORIDE 0.9 % IV SOLN: INTRAVENOUS | Status: DC

## 2017-06-07 MED ORDER — LEVOFLOXACIN 500 MG PO TABS
500.0000 mg | ORAL_TABLET | Freq: Once | ORAL | Status: AC
  Administered 2017-06-07: 500 mg via ORAL
  Filled 2017-06-07: qty 1

## 2017-06-07 MED ORDER — LEVOFLOXACIN IN D5W 500 MG/100ML IV SOLN
500.0000 mg | Freq: Once | INTRAVENOUS | Status: DC
  Filled 2017-06-07: qty 100

## 2017-06-07 MED ORDER — MAGNESIUM SULFATE 2 GM/50ML IV SOLN
2.0000 g | Freq: Once | INTRAVENOUS | Status: AC
  Administered 2017-06-07: 2 g via INTRAVENOUS
  Filled 2017-06-07: qty 50

## 2017-06-07 MED ORDER — IPRATROPIUM BROMIDE 0.02 % IN SOLN
0.5000 mg | Freq: Once | RESPIRATORY_TRACT | Status: AC
  Administered 2017-06-07: 0.5 mg via RESPIRATORY_TRACT
  Filled 2017-06-07: qty 2.5

## 2017-06-07 MED ORDER — PREDNISONE 50 MG PO TABS: ORAL_TABLET | ORAL | 0 refills | Status: DC
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
## 2017-06-07 NOTE — Discharge Instructions (Signed)
Please follow with your primary care doctor in the next 2 days for a check-up. They must obtain records for further management.  ° °Do not hesitate to return to the Emergency Department for any new, worsening or concerning symptoms.  ° °

## 2017-06-07 NOTE — ED Notes (Signed)
Notified respiratory about nebulizer treatment. Respiratory recommending it be switched to 10 mg if over an hour. Will notify edp when edp available

## 2017-06-07 NOTE — ED Notes (Signed)
Patient bp 142/81 in left arm and 138/81 in right arm. EDP aware

## 2017-06-07 NOTE — ED Notes (Signed)
EDP verbally verifying 5mg  regular nebulizer as opposed to ordered hour long

## 2017-06-07 NOTE — ED Triage Notes (Signed)
Patient complains of increased shortness of breath x 3 days, denies fever. Arrived on oxygen at 2l. No chest pain, used nebulizer prior to arrival. Speaking sentences on arrival

## 2017-06-07 NOTE — ED Provider Notes (Signed)
Signs of shortness of breath typical of COPD onset 3 days ago accompanied by cough productive of clear and sometimes yellow sputum.  He denies any fever..  No nausea or vomiting no other associated symptoms.  On exam alert speaks in sentences.  Lungs with expiratory wheezes.  Coughing occasionally. Chest x-ray reviewed by me   Doug SouJacubowitz, Manan Olmo, MD 06/07/17 1550

## 2017-06-10 ENCOUNTER — Telehealth: Payer: Self-pay | Admitting: Internal Medicine

## 2017-06-10 NOTE — Telephone Encounter (Signed)
Patient called requesting med refill on acetaminophen-codeine (TYLENOL #3) 300-30 MG tablet, pt states rx I always filled on 4th but this month it will be on a Sunday.

## 2017-06-11 ENCOUNTER — Other Ambulatory Visit: Payer: Self-pay | Admitting: *Deleted

## 2017-06-11 DIAGNOSIS — M1711 Unilateral primary osteoarthritis, right knee: Secondary | ICD-10-CM

## 2017-06-11 MED ORDER — ACETAMINOPHEN-CODEINE #3 300-30 MG PO TABS: 1.0000 | ORAL_TABLET | Freq: Three times a day (TID) | ORAL | 0 refills | Status: DC | PRN

## 2017-06-11 NOTE — Telephone Encounter (Signed)
Patient was advised of prescription lasting until Sunday and a new script being ready for pick up on Monday morning.

## 2017-06-11 NOTE — Telephone Encounter (Signed)
Prescribed last 05/14/17. May pick up and fill on monday

## 2017-06-11 NOTE — Telephone Encounter (Signed)
Pt called to check on the statu of his refill, he asking if he can get it 06/12/17 since we are close the weekend, please follow

## 2017-06-16 DIAGNOSIS — J129 Viral pneumonia, unspecified: Secondary | ICD-10-CM | POA: Diagnosis not present

## 2017-06-16 DIAGNOSIS — J449 Chronic obstructive pulmonary disease, unspecified: Secondary | ICD-10-CM | POA: Diagnosis not present

## 2017-06-19 ENCOUNTER — Telehealth: Payer: Self-pay | Admitting: *Deleted

## 2017-06-19 NOTE — Telephone Encounter (Signed)
Patient called c/o pus coming out of his nipple. Suggested he see his primary care MD and he wanted to see someone here. He said he has been seen by Dr. Drue SecondSnider for this in the past and had to be referred to a surgeon. Scheduled with Judeth CornfieldStephanie for 06/23/17. Wendall MolaJacqueline Cockerham

## 2017-06-22 ENCOUNTER — Encounter: Payer: Self-pay | Admitting: Podiatry

## 2017-06-22 ENCOUNTER — Ambulatory Visit (INDEPENDENT_AMBULATORY_CARE_PROVIDER_SITE_OTHER): Payer: Medicare HMO | Admitting: Podiatry

## 2017-06-22 ENCOUNTER — Telehealth: Payer: Self-pay | Admitting: Podiatry

## 2017-06-22 DIAGNOSIS — E1142 Type 2 diabetes mellitus with diabetic polyneuropathy: Secondary | ICD-10-CM

## 2017-06-22 DIAGNOSIS — B351 Tinea unguium: Secondary | ICD-10-CM | POA: Diagnosis not present

## 2017-06-22 DIAGNOSIS — L853 Xerosis cutis: Secondary | ICD-10-CM

## 2017-06-22 DIAGNOSIS — M79676 Pain in unspecified toe(s): Secondary | ICD-10-CM | POA: Diagnosis not present

## 2017-06-22 MED ORDER — UREA 10 % EX CREA: TOPICAL_CREAM | CUTANEOUS | 0 refills | Status: DC | PRN

## 2017-06-22 NOTE — Progress Notes (Signed)
Patient ID: Chris RiversJerome A Scaglione [name]., male   DOB: 12/07/1965, 51 y.o.   MRN: [id number]    Subjective: This patient presents for scheduled visit complaining of uncomfortable and elongated toenails and walking wearing shoes and requests toenail debridement   Objective Orientated 3  Vascular: DP pulses 2/4 bilaterally PT pulses 2/4 bilaterally Capillary reflex immediate bilaterally  Neurological: Sensation to 10 g monofilament wire intact 5/5 right and 1/5 left Vibratory sensation equal and reactive bilaterally Ankle reflex equal and reactive bilaterally  Dermatological: Extremely dry plantar skin noted bilaterally The toenails are extremely elongated, hypertrophic, discolored, incurvated, and tender to palpation 6-10  Musculoskeletal: No deformities noted bilaterally There is no obvious restriction ankle, subtalar, midtarsal joints bilaterally   Assessment: Satisfactory vascular status Loss of protective sensation left Symptomatic onychomycoses 6-10 Type II diabetic Xerosis  HIV positive  Plan: Debridement toenails 10 without any bleeding  Rx 10% urea cream, apply twice a day to heels  Reappoint 3 months

## 2017-06-22 NOTE — Patient Instructions (Signed)

## 2017-06-22 NOTE — Telephone Encounter (Signed)
This is [facility] calling about the urea 10% cream. That is no longer made. I was calling to see if we could get that changed to 20% cream 85 grahams. Give us a call back at [phone]. Thank you. Bye.

## 2017-06-23 ENCOUNTER — Encounter: Payer: Self-pay | Admitting: Infectious Diseases

## 2017-06-23 ENCOUNTER — Other Ambulatory Visit: Payer: Self-pay

## 2017-06-23 ENCOUNTER — Ambulatory Visit (INDEPENDENT_AMBULATORY_CARE_PROVIDER_SITE_OTHER): Payer: Medicare HMO | Admitting: Infectious Diseases

## 2017-06-23 VITALS — BP 145/79 | HR 110 | Temp 97.7°F | Wt 244.0 lb

## 2017-06-23 DIAGNOSIS — N611 Abscess of the breast and nipple: Secondary | ICD-10-CM

## 2017-06-23 DIAGNOSIS — L0292 Furuncle, unspecified: Secondary | ICD-10-CM | POA: Diagnosis not present

## 2017-06-23 DIAGNOSIS — E118 Type 2 diabetes mellitus with unspecified complications: Secondary | ICD-10-CM

## 2017-06-23 MED ORDER — UREA 20 % EX CREA: TOPICAL_CREAM | CUTANEOUS | 1 refills | Status: DC | PRN

## 2017-06-23 MED ORDER — DOXYCYCLINE HYCLATE 100 MG PO TABS: 100.0000 mg | ORAL_TABLET | Freq: Two times a day (BID) | ORAL | 0 refills | Status: AC

## 2017-06-23 NOTE — Progress Notes (Signed)
Chris Nabor Hearne Jr.  [birth year] [id number]   Brief Narrative: Chris [name] a patient of Dr. Baxter Flattery for Chris [name] HIV infection. Chris [name] maintained on Biktarvy and has had excellent control of this condition.   PCP - Tresa Garter, MD    Patient Active Problem List   Diagnosis Date Noted  . Dental caries 06/01/2017  . COPD with acute exacerbation ([place]) 05/26/2017  . Acute bronchitis 11/12/2016  . Special screening for malignant neoplasms, colon   . Dysphagia   . Candida esophagitis ([place])   . Abdominal pain, epigastric 07/17/2016  . Candidiasis of mouth 03/27/2016  . Adjustment disorder with depressed mood 01/04/2016  . Callus of foot 01/04/2016  . Hoarseness of voice 10/04/2015  . AIDS (acquired immunodeficiency syndrome) ([place]) 10/04/2015  . Type 2 diabetes mellitus with complication, with long-term current use of insulin ([place]) 10/04/2015  . Cigarette nicotine dependence without complication 73/41/9379  . Morbid (severe) obesity due to excess calories ([place]) 09/04/2015  . Primary osteoarthritis of right knee 03/26/2015  . Genital warts 01/22/2015  . Other male erectile dysfunction 01/22/2015  . Type 2 diabetes mellitus with complication ([place]) 02/40/9735  . Essential hypertension, benign 12/04/2014  . Atopic eczema 12/04/2014  . Cyst (solitary) of breast 08/23/2013  . Breast abscess 08/23/2013  . Obstructive sleep apnea syndrome 08/23/2013  . Cigarette smoker 02/17/2013  . Hyperglycemia 02/17/2013  . Uncontrolled diabetes mellitus ([place]) 02/17/2013  . DM (diabetes mellitus) ([place]) 01/27/2013  . Steroid-induced hyperglycemia 01/12/2013  . Chronic respiratory failure with hypoxia and hypercapnia ([place]) 01/12/2013  . COPD  GOLD III, still smoking 01/11/2013  . Nicotine abuse 01/11/2013  . HIV disease ([place]) 12/06/2012      Medication List        Accurate as of 06/23/17  3:31 PM. Always use your most recent med list.          ACCU-CHEK AVIVA PLUS w/Device Kit 250 Test up to 4  times daily   acetaminophen-codeine 300-30 MG tablet Commonly known as:  TYLENOL #3 Take 1 tablet by mouth every 8 (eight) hours as needed for moderate pain.   albuterol 108 (90 Base) MCG/ACT inhaler Commonly known as:  PROVENTIL HFA;VENTOLIN HFA Inhale 2 puffs into the lungs every 4 (four) hours as needed for wheezing or shortness of breath (((PLAN B))).   atorvastatin 20 MG tablet Commonly known as:  LIPITOR Take 1 tablet (20 mg total) by mouth daily.   bictegravir-emtricitabine-tenofovir AF 50-200-25 MG Tabs tablet Commonly known as:  BIKTARVY Take 1 tablet by mouth daily.   budesonide-formoterol 160-4.5 MCG/ACT inhaler Commonly known as:  SYMBICORT Take 2 puffs first thing in am and then another 2 puffs about 12 hours later.   clotrimazole 1 % cream Commonly known as:  LOTRIMIN Apply 1 application topically 2 (two) times daily.   * EZ SMART BLOOD GLUCOSE LANCETS Misc 1 Units by Does not apply route 4 (four) times daily.   * ACCU-CHEK SOFTCLIX LANCETS lancets Use as instructed   glucose blood test strip Use as instructed   insulin glargine 100 UNIT/ML injection Commonly known as:  LANTUS Inject 0.25 mLs (25 Units total) into the skin every morning.   Insulin Pen Needle 32G X 5 MM Misc Use as directed   Insulin Syringe-Needle U-100 31G X 15/64" 0.5 ML Misc Commonly known as:  BD INSULIN SYRINGE ULTRAFINE Use as directed   ipratropium-albuterol 0.5-2.5 (3) MG/3ML Soln Commonly known as:  DUONEB Take 3 mLs by nebulization every 6 (six)  hours as needed.   levofloxacin 500 MG tablet Commonly known as:  LEVAQUIN Take 1 tablet (500 mg total) by mouth daily.   metFORMIN 1000 MG tablet Commonly known as:  GLUCOPHAGE Take 1 tablet (1,000 mg total) by mouth 2 (two) times daily with a meal.   OXYGEN   pantoprazole 40 MG tablet Commonly known as:  PROTONIX Take 1 tablet (40 mg total) by mouth 2 (two) times daily before a meal.   predniSONE 50 MG tablet Commonly  known as:  DELTASONE Take 1 tablet daily with breakfast   sildenafil 50 MG tablet Commonly known as:  VIAGRA Take 0.5 tablets (25 mg total) by mouth daily as needed for erectile dysfunction.   Tiotropium Bromide Monohydrate 2.5 MCG/ACT Aers Commonly known as:  SPIRIVA RESPIMAT Inhale 2 puffs into the lungs daily.   urea 20 % cream Commonly known as:  CARMOL Apply as needed topically.      * This list has 2 medication(s) that are the same as other medications prescribed for you. Read the directions carefully, and ask your doctor or other care provider to review them with you.          Where to Get Your Medications    These medications were sent to [facility], [place] [facility] [address]  [place], [place] [address]   Hours:  24-hours Phone:  [phone]   urea 20 % cream     Subjective: HPI:  Nipple "Boil" with Drainage = Chris [name] here today with concern over nipple discharge. Has had this problem in the past and required surgery to this Came back about 3 weeks ago and describes white drainage coming out of area and through nipple with manual expresses. No fevers, pain, redness, swelling. Thick white dressing.   Chris [name] recently finished course of Levaquin + Prednisone for COPD flare after requiring hospitalization.   Diabetes under poor control - cannot give up Chris [name] sodas and frequently with blood sugars in 200s (evident in recent admission as well).   Review of Systems: Review of Systems  Constitutional: Negative for chills and fever.  HENT: Negative for tinnitus.   Eyes: Negative for blurred vision and photophobia.  Respiratory: Negative for cough and sputum production.   Cardiovascular: Negative for chest pain.  Gastrointestinal: Negative for diarrhea, nausea and vomiting.  Genitourinary: Negative for dysuria.  Skin: Negative for rash.       Boil on right nipple   Neurological: Negative  for headaches.    Past Medical History:  Diagnosis Date  . COPD (chronic obstructive pulmonary disease) ([place])   . Depression   . Diabetes mellitus without complication ([place])   . Dyspnea   . Emphysema   . GERD (gastroesophageal reflux disease)   . HIV disease ([place])     Social History   Tobacco Use  . Smoking status: Current Some Day Smoker    Packs/day: 0.10    Years: 37.00    Pack years: 3.70    Types: Cigarettes  . Smokeless tobacco: Never Used  Substance Use Topics  . Alcohol use: No    Alcohol/week: 0.0 oz  . Drug use: No    Comment: quit 04    Family History  Problem Relation Age of Onset  . Throat cancer Father 83  . Emphysema Maternal Uncle        was a smoker  . Diabetes Maternal Uncle   .  Heart disease Maternal Uncle   . COPD Maternal Uncle   . Asthma Maternal Uncle   . Diabetes Maternal Uncle   . Asthma Maternal Aunt   . Diabetes Maternal Aunt   . Throat cancer Maternal Grandmother        never smoker, used snuff  . Diabetes Maternal Grandmother   . Breast cancer Maternal Aunt     Allergies  Allergen Reactions  . Bactrim [Sulfamethoxazole-Trimethoprim] Hives    Objective:  Vitals:   06/23/17 1522  BP: (!) 145/79  Pulse: (!) 110  Temp: 97.7 F (36.5 C)  TempSrc: Oral  SpO2: 92%  Weight: 244 lb (110.7 kg)   Body mass index is 36.03 kg/m.  Physical Exam  Constitutional: Chris [name] oriented to person, place, and time and well-developed, well-nourished, and in no distress.  HENT:  Mouth/Throat: No oral lesions. Normal dentition. No dental caries.  Cardiovascular: Regular rhythm and normal heart sounds. Tachycardia present.  Pulmonary/Chest: Effort normal and breath sounds normal.    Lymphadenopathy:    Chris [name] has no cervical adenopathy.  Neurological: Chris [name] alert and oriented to person, place, and time.  Skin: Skin is warm and dry.  Psychiatric: Mood and affect normal.  Vitals reviewed.   Lab Results Lab Results  Component Value Date    WBC 6.7 06/07/2017   HGB 14.3 06/07/2017   HCT 46.4 06/07/2017   MCV 92.4 06/07/2017   PLT 186 06/07/2017    Lab Results  Component Value Date   CREATININE 0.70 06/07/2017   BUN <5 (L) 06/07/2017   NA 133 (L) 06/07/2017   K 3.8 06/07/2017   CL 99 (L) 06/07/2017   CO2 27 06/07/2017    HIV 1 RNA Quant (copies/mL)  Date Value  05/19/2017 <20 NOT DETECTED  03/31/2017 <20 NOT DETECTED  11/13/2016 <20 DETECTED (A)   CD4 T Cell Abs (/uL)  Date Value  05/19/2017 380 (L)  03/31/2017 240 (L)  11/13/2016 370 (L)     Problem List Items Addressed This Visit    None      Janene Madeira, MSN, NP-C [facility] Pager: [phone]  06/23/17 3:31 PM

## 2017-06-23 NOTE — Patient Instructions (Signed)
Will send you in an antibiotic called Doxycycline - take one pill twice a day with food until gone.   Will call you if the results of the testing show we picked the wrong antibiotic.   Call us back if no improvement - may need to re-consider talking with surgeon.

## 2017-06-23 NOTE — Assessment & Plan Note (Signed)
Nipple cleansed with sterile saline. Material I was able to express directly from the center of his nipple was collected for culture. I will start him on empiric doxycycline x 7 days. Allergic to Bactrim. Counseled regarding possible need to return to surgeon for further evaluation. Encouraged warm compresses to site several times a day. I suspect this may be infected cyst that was incompletely removed in the past.  Advised to return should this offer no improvement.  Will monitor cultures; suspect staph.

## 2017-06-23 NOTE — Assessment & Plan Note (Signed)
Diabetes under poor control although he has lost a good amount of weight recently. Recently on prednisone for COPD flare. Continues to drink soda. Counseled about link diabetes has with wound healing and need to keep blood sugars under better control.

## 2017-06-26 LAB — WOUND CULTURE
MICRO NUMBER:: 81277920
SPECIMEN QUALITY: ADEQUATE

## 2017-07-16 ENCOUNTER — Other Ambulatory Visit: Payer: Self-pay | Admitting: Internal Medicine

## 2017-07-16 ENCOUNTER — Telehealth: Payer: Self-pay | Admitting: Internal Medicine

## 2017-07-16 DIAGNOSIS — J449 Chronic obstructive pulmonary disease, unspecified: Secondary | ICD-10-CM | POA: Diagnosis not present

## 2017-07-16 DIAGNOSIS — M1711 Unilateral primary osteoarthritis, right knee: Secondary | ICD-10-CM

## 2017-07-16 DIAGNOSIS — J129 Viral pneumonia, unspecified: Secondary | ICD-10-CM | POA: Diagnosis not present

## 2017-07-16 MED ORDER — ACETAMINOPHEN-CODEINE #3 300-30 MG PO TABS: 1.0000 | ORAL_TABLET | Freq: Three times a day (TID) | ORAL | 0 refills | Status: DC | PRN

## 2017-07-16 NOTE — Telephone Encounter (Signed)
Sent!

## 2017-07-16 NOTE — Telephone Encounter (Signed)
Pt. Called requesting a refill on Tylenol # 3. Pt. Uses [facility] pharmacy on [address]. Please f/u

## 2017-07-21 ENCOUNTER — Emergency Department (HOSPITAL_COMMUNITY): Payer: Medicare HMO

## 2017-07-21 ENCOUNTER — Encounter (HOSPITAL_COMMUNITY): Payer: Self-pay | Admitting: Emergency Medicine

## 2017-07-21 ENCOUNTER — Inpatient Hospital Stay (HOSPITAL_COMMUNITY)
Admission: EM | Admit: 2017-07-21 | Discharge: 2017-07-28 | DRG: 190 | Disposition: A | Discharge status: Home / Self Care | Payer: Medicare HMO | Attending: Internal Medicine | Admitting: Internal Medicine

## 2017-07-21 ENCOUNTER — Other Ambulatory Visit: Payer: Self-pay

## 2017-07-21 DIAGNOSIS — Z825 Family history of asthma and other chronic lower respiratory diseases: Secondary | ICD-10-CM | POA: Diagnosis not present

## 2017-07-21 DIAGNOSIS — J9611 Chronic respiratory failure with hypoxia: Secondary | ICD-10-CM | POA: Diagnosis present

## 2017-07-21 DIAGNOSIS — Z9981 Dependence on supplemental oxygen: Secondary | ICD-10-CM

## 2017-07-21 DIAGNOSIS — Z8249 Family history of ischemic heart disease and other diseases of the circulatory system: Secondary | ICD-10-CM | POA: Diagnosis not present

## 2017-07-21 DIAGNOSIS — R739 Hyperglycemia, unspecified: Secondary | ICD-10-CM | POA: Diagnosis not present

## 2017-07-21 DIAGNOSIS — F17210 Nicotine dependence, cigarettes, uncomplicated: Secondary | ICD-10-CM | POA: Diagnosis present

## 2017-07-21 DIAGNOSIS — Z79899 Other long term (current) drug therapy: Secondary | ICD-10-CM | POA: Diagnosis not present

## 2017-07-21 DIAGNOSIS — Z833 Family history of diabetes mellitus: Secondary | ICD-10-CM

## 2017-07-21 DIAGNOSIS — T380X5A Adverse effect of glucocorticoids and synthetic analogues, initial encounter: Secondary | ICD-10-CM | POA: Diagnosis not present

## 2017-07-21 DIAGNOSIS — Z7951 Long term (current) use of inhaled steroids: Secondary | ICD-10-CM

## 2017-07-21 DIAGNOSIS — Z883 Allergy status to other anti-infective agents status: Secondary | ICD-10-CM

## 2017-07-21 DIAGNOSIS — J9612 Chronic respiratory failure with hypercapnia: Secondary | ICD-10-CM | POA: Diagnosis not present

## 2017-07-21 DIAGNOSIS — J9601 Acute respiratory failure with hypoxia: Secondary | ICD-10-CM | POA: Diagnosis not present

## 2017-07-21 DIAGNOSIS — F329 Major depressive disorder, single episode, unspecified: Secondary | ICD-10-CM | POA: Diagnosis present

## 2017-07-21 DIAGNOSIS — Z794 Long term (current) use of insulin: Secondary | ICD-10-CM

## 2017-07-21 DIAGNOSIS — G4733 Obstructive sleep apnea (adult) (pediatric): Secondary | ICD-10-CM | POA: Diagnosis not present

## 2017-07-21 DIAGNOSIS — J9621 Acute and chronic respiratory failure with hypoxia: Secondary | ICD-10-CM | POA: Diagnosis present

## 2017-07-21 DIAGNOSIS — I5031 Acute diastolic (congestive) heart failure: Secondary | ICD-10-CM | POA: Diagnosis present

## 2017-07-21 DIAGNOSIS — D72829 Elevated white blood cell count, unspecified: Secondary | ICD-10-CM | POA: Diagnosis not present

## 2017-07-21 DIAGNOSIS — E118 Type 2 diabetes mellitus with unspecified complications: Secondary | ICD-10-CM | POA: Diagnosis not present

## 2017-07-21 DIAGNOSIS — Z6839 Body mass index (BMI) 39.0-39.9, adult: Secondary | ICD-10-CM | POA: Diagnosis not present

## 2017-07-21 DIAGNOSIS — L2089 Other atopic dermatitis: Secondary | ICD-10-CM | POA: Diagnosis present

## 2017-07-21 DIAGNOSIS — J449 Chronic obstructive pulmonary disease, unspecified: Secondary | ICD-10-CM | POA: Diagnosis not present

## 2017-07-21 DIAGNOSIS — J441 Chronic obstructive pulmonary disease with (acute) exacerbation: Secondary | ICD-10-CM | POA: Diagnosis not present

## 2017-07-21 DIAGNOSIS — F32A Depression, unspecified: Secondary | ICD-10-CM

## 2017-07-21 DIAGNOSIS — I5041 Acute combined systolic (congestive) and diastolic (congestive) heart failure: Secondary | ICD-10-CM | POA: Diagnosis not present

## 2017-07-21 DIAGNOSIS — Z560 Unemployment, unspecified: Secondary | ICD-10-CM

## 2017-07-21 DIAGNOSIS — E1165 Type 2 diabetes mellitus with hyperglycemia: Secondary | ICD-10-CM | POA: Diagnosis present

## 2017-07-21 DIAGNOSIS — I480 Paroxysmal atrial fibrillation: Secondary | ICD-10-CM | POA: Diagnosis not present

## 2017-07-21 DIAGNOSIS — I10 Essential (primary) hypertension: Secondary | ICD-10-CM | POA: Diagnosis not present

## 2017-07-21 DIAGNOSIS — R0602 Shortness of breath: Secondary | ICD-10-CM | POA: Diagnosis not present

## 2017-07-21 DIAGNOSIS — I110 Hypertensive heart disease with heart failure: Secondary | ICD-10-CM | POA: Diagnosis present

## 2017-07-21 DIAGNOSIS — E119 Type 2 diabetes mellitus without complications: Secondary | ICD-10-CM

## 2017-07-21 DIAGNOSIS — I340 Nonrheumatic mitral (valve) insufficiency: Secondary | ICD-10-CM | POA: Diagnosis not present

## 2017-07-21 DIAGNOSIS — E785 Hyperlipidemia, unspecified: Secondary | ICD-10-CM | POA: Diagnosis present

## 2017-07-21 DIAGNOSIS — J9622 Acute and chronic respiratory failure with hypercapnia: Secondary | ICD-10-CM | POA: Diagnosis present

## 2017-07-21 DIAGNOSIS — B20 Human immunodeficiency virus [HIV] disease: Secondary | ICD-10-CM | POA: Diagnosis present

## 2017-07-21 DIAGNOSIS — R69 Illness, unspecified: Secondary | ICD-10-CM | POA: Diagnosis not present

## 2017-07-21 DIAGNOSIS — I4891 Unspecified atrial fibrillation: Secondary | ICD-10-CM | POA: Diagnosis not present

## 2017-07-21 DIAGNOSIS — L209 Atopic dermatitis, unspecified: Secondary | ICD-10-CM | POA: Diagnosis present

## 2017-07-21 DIAGNOSIS — K219 Gastro-esophageal reflux disease without esophagitis: Secondary | ICD-10-CM | POA: Diagnosis not present

## 2017-07-21 DIAGNOSIS — I481 Persistent atrial fibrillation: Secondary | ICD-10-CM | POA: Diagnosis not present

## 2017-07-21 LAB — CBC
HCT: 50.2 % (ref 39.0–52.0)
Hemoglobin: 15.5 g/dL (ref 13.0–17.0)
MCH: 29 pg (ref 26.0–34.0)
MCHC: 30.9 g/dL (ref 30.0–36.0)
MCV: 94 fL (ref 78.0–100.0)
PLATELETS: 202 10*3/uL (ref 150–400)
RBC: 5.34 MIL/uL (ref 4.22–5.81)
RDW: 12.6 % (ref 11.5–15.5)
WBC: 10.3 10*3/uL (ref 4.0–10.5)

## 2017-07-21 LAB — I-STAT ARTERIAL BLOOD GAS, ED
Acid-Base Excess: 2 mmol/L (ref 0.0–2.0)
Bicarbonate: 33 mmol/L — ABNORMAL HIGH (ref 20.0–28.0)
O2 Saturation: 96 %
PH ART: 7.251 — AB (ref 7.350–7.450)
TCO2: 35 mmol/L — ABNORMAL HIGH (ref 22–32)
pCO2 arterial: 75.1 mmHg (ref 32.0–48.0)
pO2, Arterial: 103 mmHg (ref 83.0–108.0)

## 2017-07-21 LAB — GLUCOSE, CAPILLARY
GLUCOSE-CAPILLARY: 304 mg/dL — AB (ref 65–99)
GLUCOSE-CAPILLARY: 288 mg/dL — AB (ref 65–99)

## 2017-07-21 LAB — BASIC METABOLIC PANEL
Anion gap: 7 (ref 5–15)
BUN: <5 mg/dL — ABNORMAL LOW (ref 6–20)
CHLORIDE: 98 mmol/L — AB (ref 101–111)
CO2: 30 mmol/L (ref 22–32)
CREATININE: 0.79 mg/dL (ref 0.61–1.24)
Calcium: 9 mg/dL (ref 8.9–10.3)
GFR calc non Af Amer: >60 mL/min (ref >60)
GLUCOSE: 187 mg/dL — AB (ref 65–99)
Potassium: 4.2 mmol/L (ref 3.5–5.1)
Sodium: 135 mmol/L (ref 135–145)

## 2017-07-21 LAB — INFLUENZA PANEL BY PCR (TYPE A & B)
Influenza A By PCR: NEGATIVE
Influenza B By PCR: NEGATIVE

## 2017-07-21 LAB — MRSA PCR SCREENING: MRSA BY PCR: NEGATIVE

## 2017-07-21 LAB — TROPONIN I

## 2017-07-21 LAB — BRAIN NATRIURETIC PEPTIDE: B NATRIURETIC PEPTIDE 5: 30.1 pg/mL (ref 0.0–100.0)

## 2017-07-21 LAB — CBG MONITORING, ED: Glucose-Capillary: 297 mg/dL — ABNORMAL HIGH (ref 65–99)

## 2017-07-21 MED ORDER — ONDANSETRON HCL 4 MG/2ML IJ SOLN: 4.0000 mg | Freq: Four times a day (QID) | INTRAMUSCULAR | Status: DC | PRN

## 2017-07-21 MED ORDER — BISACODYL 10 MG RE SUPP: 10.0000 mg | Freq: Every day | RECTAL | Status: DC | PRN

## 2017-07-21 MED ORDER — BICTEGRAVIR-EMTRICITAB-TENOFOV 50-200-25 MG PO TABS
1.0000 | ORAL_TABLET | Freq: Every day | ORAL | Status: DC
  Administered 2017-07-23 – 2017-07-28 (×6): 1 via ORAL
  Filled 2017-07-21 (×9): qty 1

## 2017-07-21 MED ORDER — METHYLPREDNISOLONE SODIUM SUCC 40 MG IJ SOLR
40.0000 mg | Freq: Three times a day (TID) | INTRAMUSCULAR | Status: DC
  Administered 2017-07-21 – 2017-07-22 (×2): 40 mg via INTRAVENOUS
  Filled 2017-07-21 (×2): qty 1

## 2017-07-21 MED ORDER — ONDANSETRON HCL 4 MG PO TABS
4.0000 mg | ORAL_TABLET | Freq: Four times a day (QID) | ORAL | Status: DC | PRN
Start: 2017-07-21 — End: 2017-07-28

## 2017-07-21 MED ORDER — IPRATROPIUM-ALBUTEROL 0.5-2.5 (3) MG/3ML IN SOLN: 3.0000 mL | Freq: Four times a day (QID) | RESPIRATORY_TRACT | Status: DC | PRN

## 2017-07-21 MED ORDER — GUAIFENESIN ER 600 MG PO TB12: 600.0000 mg | ORAL_TABLET | Freq: Two times a day (BID) | ORAL | Status: DC | PRN

## 2017-07-21 MED ORDER — IPRATROPIUM-ALBUTEROL 0.5-2.5 (3) MG/3ML IN SOLN: 3.0000 mL | RESPIRATORY_TRACT | Status: DC | PRN

## 2017-07-21 MED ORDER — IPRATROPIUM-ALBUTEROL 0.5-2.5 (3) MG/3ML IN SOLN
3.0000 mL | Freq: Four times a day (QID) | RESPIRATORY_TRACT | Status: DC
  Administered 2017-07-21 – 2017-07-28 (×28): 3 mL via RESPIRATORY_TRACT
  Filled 2017-07-21 (×26): qty 3

## 2017-07-21 MED ORDER — AZITHROMYCIN 500 MG PO TABS
500.0000 mg | ORAL_TABLET | Freq: Every day | ORAL | Status: DC
  Administered 2017-07-22 – 2017-07-27 (×6): 500 mg via ORAL
  Filled 2017-07-21 (×6): qty 1

## 2017-07-21 MED ORDER — LEVALBUTEROL HCL 1.25 MG/0.5ML IN NEBU
1.2500 mg | INHALATION_SOLUTION | Freq: Once | RESPIRATORY_TRACT | Status: AC
  Administered 2017-07-21: 1.25 mg via RESPIRATORY_TRACT
  Filled 2017-07-21: qty 0.5

## 2017-07-21 MED ORDER — PANTOPRAZOLE SODIUM 40 MG PO TBEC
40.0000 mg | DELAYED_RELEASE_TABLET | Freq: Two times a day (BID) | ORAL | Status: DC
  Administered 2017-07-22 – 2017-07-28 (×13): 40 mg via ORAL
  Filled 2017-07-21 (×13): qty 1

## 2017-07-21 MED ORDER — SENNOSIDES-DOCUSATE SODIUM 8.6-50 MG PO TABS: 1.0000 | ORAL_TABLET | Freq: Every evening | ORAL | Status: DC | PRN

## 2017-07-21 MED ORDER — NICOTINE POLACRILEX 2 MG MT GUM: 2.0000 mg | CHEWING_GUM | OROMUCOSAL | Status: DC | PRN

## 2017-07-21 MED ORDER — INSULIN ASPART 100 UNIT/ML ~~LOC~~ SOLN: 3.0000 [IU] | Freq: Three times a day (TID) | SUBCUTANEOUS | Status: DC

## 2017-07-21 MED ORDER — ATORVASTATIN CALCIUM 20 MG PO TABS
20.0000 mg | ORAL_TABLET | Freq: Every day | ORAL | Status: DC
  Administered 2017-07-22 – 2017-07-28 (×7): 20 mg via ORAL
  Filled 2017-07-21 (×7): qty 1

## 2017-07-21 MED ORDER — TIOTROPIUM BROMIDE MONOHYDRATE 18 MCG IN CAPS
18.0000 ug | ORAL_CAPSULE | Freq: Every day | RESPIRATORY_TRACT | Status: DC
  Administered 2017-07-22 – 2017-07-28 (×6): 18 ug via RESPIRATORY_TRACT
  Filled 2017-07-21 (×2): qty 5

## 2017-07-21 MED ORDER — ALBUTEROL SULFATE (2.5 MG/3ML) 0.083% IN NEBU: 2.5000 mg | INHALATION_SOLUTION | RESPIRATORY_TRACT | Status: DC | PRN

## 2017-07-21 MED ORDER — ACETAMINOPHEN 325 MG PO TABS
650.0000 mg | ORAL_TABLET | Freq: Four times a day (QID) | ORAL | Status: DC | PRN
Start: 2017-07-21 — End: 2017-07-28

## 2017-07-21 MED ORDER — IPRATROPIUM BROMIDE 0.02 % IN SOLN: 0.5000 mg | RESPIRATORY_TRACT | Status: DC

## 2017-07-21 MED ORDER — HYDROCODONE-ACETAMINOPHEN 5-325 MG PO TABS: 1.0000 | ORAL_TABLET | ORAL | Status: DC | PRN

## 2017-07-21 MED ORDER — INSULIN GLARGINE 100 UNIT/ML ~~LOC~~ SOLN: 13.0000 [IU] | Freq: Every morning | SUBCUTANEOUS | Status: DC

## 2017-07-21 MED ORDER — METHYLPREDNISOLONE SODIUM SUCC 125 MG IJ SOLR
60.0000 mg | Freq: Three times a day (TID) | INTRAMUSCULAR | Status: DC
  Administered 2017-07-21: 60 mg via INTRAVENOUS
  Filled 2017-07-21: qty 2

## 2017-07-21 MED ORDER — INSULIN ASPART 100 UNIT/ML ~~LOC~~ SOLN
0.0000 [IU] | Freq: Three times a day (TID) | SUBCUTANEOUS | Status: DC
  Administered 2017-07-22: 15 [IU] via SUBCUTANEOUS
  Administered 2017-07-22: 7 [IU] via SUBCUTANEOUS
  Administered 2017-07-22: 4 [IU] via SUBCUTANEOUS
  Administered 2017-07-23: 7 [IU] via SUBCUTANEOUS
  Administered 2017-07-23: 15 [IU] via SUBCUTANEOUS
  Administered 2017-07-23: 7 [IU] via SUBCUTANEOUS
  Administered 2017-07-24: 11 [IU] via SUBCUTANEOUS
  Administered 2017-07-24 – 2017-07-25 (×3): 20 [IU] via SUBCUTANEOUS
  Administered 2017-07-25: 11 [IU] via SUBCUTANEOUS
  Administered 2017-07-26 (×2): 15 [IU] via SUBCUTANEOUS
  Administered 2017-07-26: 20 [IU] via SUBCUTANEOUS
  Administered 2017-07-27 (×2): 15 [IU] via SUBCUTANEOUS
  Administered 2017-07-27: 11 [IU] via SUBCUTANEOUS
  Administered 2017-07-28: 7 [IU] via SUBCUTANEOUS
  Administered 2017-07-28 (×2): 20 [IU] via SUBCUTANEOUS

## 2017-07-21 MED ORDER — LEVOFLOXACIN IN D5W 750 MG/150ML IV SOLN
750.0000 mg | Freq: Once | INTRAVENOUS | Status: AC
  Administered 2017-07-21: 750 mg via INTRAVENOUS
  Filled 2017-07-21: qty 150

## 2017-07-21 MED ORDER — INSULIN ASPART 100 UNIT/ML ~~LOC~~ SOLN: 0.0000 [IU] | Freq: Three times a day (TID) | SUBCUTANEOUS | Status: DC

## 2017-07-21 MED ORDER — INSULIN GLARGINE 100 UNIT/ML ~~LOC~~ SOLN
13.0000 [IU] | Freq: Every day | SUBCUTANEOUS | Status: DC
  Administered 2017-07-21 – 2017-07-22 (×2): 13 [IU] via SUBCUTANEOUS
  Filled 2017-07-21 (×2): qty 0.13

## 2017-07-21 MED ORDER — ACETAMINOPHEN 650 MG RE SUPP: 650.0000 mg | Freq: Four times a day (QID) | RECTAL | Status: DC | PRN

## 2017-07-21 MED ORDER — ENOXAPARIN SODIUM 60 MG/0.6ML ~~LOC~~ SOLN
60.0000 mg | SUBCUTANEOUS | Status: DC
  Administered 2017-07-21 – 2017-07-22 (×2): 60 mg via SUBCUTANEOUS
  Filled 2017-07-21 (×2): qty 0.6

## 2017-07-21 MED ORDER — INSULIN ASPART 100 UNIT/ML ~~LOC~~ SOLN
0.0000 [IU] | Freq: Every day | SUBCUTANEOUS | Status: DC
  Administered 2017-07-21: 3 [IU] via SUBCUTANEOUS
  Administered 2017-07-23: 4 [IU] via SUBCUTANEOUS
  Administered 2017-07-24 – 2017-07-26 (×3): 5 [IU] via SUBCUTANEOUS
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
## 2017-07-21 NOTE — Progress Notes (Signed)
RT transferred patient from ED to room 3M01 with no complications. Report given to RT on floor. Vitals are stable. RT will continue to monitor.

## 2017-07-21 NOTE — ED Notes (Signed)
Courage MD reports pt can drink but is not allowed to eat at this time. Pt removed from biPap to see how he tolerates.

## 2017-07-21 NOTE — ED Triage Notes (Signed)
Pt coming from home via EMS. Pt has been feeling SOB for months. Called EMS today for increased SOB. Diahporetic, tachypnic, wheezing. EMS gave 2 duonebs. 97% on duoneb. 92% on room air. BP 176/100. Denies CP.

## 2017-07-21 NOTE — ED Notes (Signed)
Respiratory notified to place pt back on bipap

## 2017-07-21 NOTE — ED Notes (Signed)
RT at bedside.

## 2017-07-21 NOTE — ED Notes (Signed)
Attempted report 

## 2017-07-21 NOTE — ED Triage Notes (Signed)
EMS also gave 125mg  Solu medrol

## 2017-07-21 NOTE — Plan of Care (Signed)
Dicussed with the patient the purpose of not eating or drinking when on the bipap due to possible aspiration with some teach back displayed; reinforcement needed.

## 2017-07-21 NOTE — ED Notes (Signed)
Pt requesting to come off bipap. Asking for urinal sitting on side of bed refusing to allow staff to help.

## 2017-07-21 NOTE — ED Notes (Signed)
ED Provider at bedside. 

## 2017-07-21 NOTE — ED Notes (Signed)
Pt extremely agitated with wearing biPap. Pt asking, "How long do I have to wear this mask?" Informed Asher MuirJamie - RN

## 2017-07-21 NOTE — ED Notes (Signed)
Notified respiratory for pt need for nebulizer and biPap

## 2017-07-21 NOTE — ED Notes (Signed)
Pt placed back on bipap, unable to tolerate being off, O2 dipping into high 80s

## 2017-07-21 NOTE — ED Notes (Signed)
Pt requesting to come off of biPap, removed and notified Courage MD who request pt stay on biPap until 3 and then reevaluate. Will speak with pt.
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
## 2017-07-22 ENCOUNTER — Observation Stay (HOSPITAL_BASED_OUTPATIENT_CLINIC_OR_DEPARTMENT_OTHER): Payer: Medicare HMO

## 2017-07-22 DIAGNOSIS — I340 Nonrheumatic mitral (valve) insufficiency: Secondary | ICD-10-CM

## 2017-07-22 DIAGNOSIS — J9601 Acute respiratory failure with hypoxia: Secondary | ICD-10-CM

## 2017-07-22 LAB — URINALYSIS, ROUTINE W REFLEX MICROSCOPIC
BILIRUBIN URINE: NEGATIVE
Bacteria, UA: NONE SEEN
Ketones, ur: NEGATIVE mg/dL
LEUKOCYTES UA: NEGATIVE
NITRITE: NEGATIVE
PROTEIN: 100 mg/dL — AB
Specific Gravity, Urine: 1.02 (ref 1.005–1.030)
Squamous Epithelial / LPF: NONE SEEN
pH: 5 (ref 5.0–8.0)

## 2017-07-22 LAB — COMPREHENSIVE METABOLIC PANEL
ALT: 22 U/L (ref 17–63)
ANION GAP: 6 (ref 5–15)
AST: 14 U/L — ABNORMAL LOW (ref 15–41)
Albumin: 3.7 g/dL (ref 3.5–5.0)
Alkaline Phosphatase: 47 U/L (ref 38–126)
BUN: 11 mg/dL (ref 6–20)
CO2: 33 mmol/L — AB (ref 22–32)
Calcium: 8.7 mg/dL — ABNORMAL LOW (ref 8.9–10.3)
Chloride: 95 mmol/L — ABNORMAL LOW (ref 101–111)
Creatinine, Ser: 0.74 mg/dL (ref 0.61–1.24)
GFR calc Af Amer: >60 mL/min (ref >60)
GFR calc non Af Amer: >60 mL/min (ref >60)
GLUCOSE: 239 mg/dL — AB (ref 65–99)
Potassium: 4.7 mmol/L (ref 3.5–5.1)
Sodium: 134 mmol/L — ABNORMAL LOW (ref 135–145)
Total Bilirubin: 0.7 mg/dL (ref 0.3–1.2)
Total Protein: 8.7 g/dL — ABNORMAL HIGH (ref 6.5–8.1)

## 2017-07-22 LAB — GLUCOSE, CAPILLARY
GLUCOSE-CAPILLARY: 232 mg/dL — AB (ref 65–99)
Glucose-Capillary: 217 mg/dL — ABNORMAL HIGH (ref 65–99)
Glucose-Capillary: 301 mg/dL — ABNORMAL HIGH (ref 65–99)
Glucose-Capillary: 171 mg/dL — ABNORMAL HIGH (ref 65–99)
Glucose-Capillary: 197 mg/dL — ABNORMAL HIGH (ref 65–99)

## 2017-07-22 LAB — CBC
HCT: 48.2 % (ref 39.0–52.0)
HEMOGLOBIN: 14.7 g/dL (ref 13.0–17.0)
MCH: 29.1 pg (ref 26.0–34.0)
MCHC: 30.5 g/dL (ref 30.0–36.0)
MCV: 95.3 fL (ref 78.0–100.0)
Platelets: 217 10*3/uL (ref 150–400)
RBC: 5.06 MIL/uL (ref 4.22–5.81)
RDW: 12.5 % (ref 11.5–15.5)
WBC: 8.5 10*3/uL (ref 4.0–10.5)

## 2017-07-22 LAB — ECHOCARDIOGRAM COMPLETE
Height: 69 in
Weight: 4007.08 oz

## 2017-07-22 MED ORDER — METOPROLOL TARTRATE 5 MG/5ML IV SOLN
5.0000 mg | Freq: Four times a day (QID) | INTRAVENOUS | Status: DC | PRN
  Administered 2017-07-22: 5 mg via INTRAVENOUS
  Filled 2017-07-22: qty 5

## 2017-07-22 MED ORDER — INSULIN ASPART 100 UNIT/ML ~~LOC~~ SOLN
5.0000 [IU] | Freq: Three times a day (TID) | SUBCUTANEOUS | Status: DC
  Administered 2017-07-23 – 2017-07-28 (×18): 5 [IU] via SUBCUTANEOUS

## 2017-07-22 MED ORDER — METHYLPREDNISOLONE SODIUM SUCC 125 MG IJ SOLR
60.0000 mg | Freq: Three times a day (TID) | INTRAMUSCULAR | Status: DC
  Administered 2017-07-22 – 2017-07-25 (×11): 60 mg via INTRAVENOUS
  Administered 2017-07-26: 90 mg via INTRAVENOUS
  Filled 2017-07-22 (×14): qty 2

## 2017-07-22 MED ORDER — DEXTROSE 5 % IV SOLN
5.0000 mg/h | INTRAVENOUS | Status: DC
  Administered 2017-07-22: 5 mg/h via INTRAVENOUS
  Administered 2017-07-23 (×2): 12.5 mg/h via INTRAVENOUS
  Administered 2017-07-23 – 2017-07-26 (×12): 20 mg/h via INTRAVENOUS
  Administered 2017-07-26 (×2): 15 mg/h via INTRAVENOUS
  Administered 2017-07-27: 10 mg/h via INTRAVENOUS
  Filled 2017-07-22 (×23): qty 100

## 2017-07-22 MED ORDER — METOPROLOL TARTRATE 5 MG/5ML IV SOLN: 2.5000 mg | Freq: Four times a day (QID) | INTRAVENOUS | Status: DC | PRN

## 2017-07-22 MED ORDER — MOMETASONE FURO-FORMOTEROL FUM 200-5 MCG/ACT IN AERO
2.0000 | INHALATION_SPRAY | Freq: Two times a day (BID) | RESPIRATORY_TRACT | Status: DC
  Administered 2017-07-22 – 2017-07-28 (×13): 2 via RESPIRATORY_TRACT
  Filled 2017-07-22 (×2): qty 8.8

## 2017-07-22 MED ORDER — METOPROLOL TARTRATE 5 MG/5ML IV SOLN
5.0000 mg | INTRAVENOUS | Status: AC
Start: 2017-07-22 — End: 2017-07-22
  Administered 2017-07-22: 5 mg via INTRAVENOUS
  Filled 2017-07-22: qty 5

## 2017-07-22 MED ORDER — METOPROLOL TARTRATE 5 MG/5ML IV SOLN
5.0000 mg | Freq: Four times a day (QID) | INTRAVENOUS | Status: DC | PRN
  Administered 2017-07-23: 5 mg via INTRAVENOUS
  Filled 2017-07-22: qty 5

## 2017-07-22 MED ORDER — DILTIAZEM LOAD VIA INFUSION
10.0000 mg | Freq: Once | INTRAVENOUS | Status: AC
  Administered 2017-07-22: 10 mg via INTRAVENOUS
  Filled 2017-07-22: qty 10

## 2017-07-22 MED ORDER — INSULIN GLARGINE 100 UNIT/ML ~~LOC~~ SOLN
25.0000 [IU] | Freq: Every day | SUBCUTANEOUS | Status: DC
Start: 2017-07-23 — End: 2017-07-25
  Administered 2017-07-23 – 2017-07-24 (×2): 25 [IU] via SUBCUTANEOUS
  Filled 2017-07-22 (×3): qty 0.25

## 2017-07-22 NOTE — Progress Notes (Signed)
Patient ID: Chris RiversJerome A Rider Jr., male   DOB: 04/17/1966, 51 y.o.   MRN: [id number]  PROGRESS NOTE    [name]  WUJ:[id number] DOB: 03/29/1966 DOA: 07/21/2017 PCP: Chris AngstJegede, Olugbemiga E, MD   Brief Narrative:  51 year old male with history of diabetes, GERD, HIV, tobacco use, COPD on oxygen only as needed at home presented with shortness of breath.  He was admitted with COPD exacerbation started on intravenous steroids.   Assessment & Plan:   Active Problems:   HIV disease (HCC)   COPD  GOLD III, still smoking   Steroid-induced hyperglycemia   Chronic respiratory failure with hypoxia and hypercapnia (HCC)   DM (diabetes mellitus) (HCC)   Obstructive sleep apnea syndrome   Essential hypertension, benign   Atopic eczema   Type 2 diabetes mellitus with complication (HCC)   Morbid (severe) obesity due to excess calories (HCC)   COPD with acute exacerbation (HCC)   Depression   GERD (gastroesophageal reflux disease)   Acute on chronic respiratory failure with hypoxia (HCC)   Acute hypoxic respiratory failure -Currently back on BiPAP.  Try and wean BiPAP to nasal cannula oxygen  Probable COPD exacerbation -Patient is still wheezing.  We will increase Solu-Medrol to 60 mg IV every 8 hours.  Add Dulera.  Continue duo nebs -If respiratory status does not improve, will call pulmonary consult  Tobacco abuse -Counseled about cessation  Diabetes mellitus type 2 -Hold oral meds.  Continue Lantus and SSI  Hyperlipidemia -Continue statins  Hypertension -Monitor.  Not on oral antihypertensives at home  HIV -Continue current home antiretroviral therapy.  Outpatient follow-up with primary care provider  Obstructive sleep apnea -Continue CPAP at night   GERD - continue PPI  DVT prophylaxis: Lovenox Code Status:  Full Family Communication: None at bedside Disposition Plan: Home in 2-3 days  Consultants: None  Procedures: None  Antimicrobials: Zithromax from 07/21/2017  onwards   Subjective: Patient seen and examined at bedside.  He feels slightly better but still short of breath and coughing.  No overnight fever or vomiting.  Objective: Vitals:   07/22/17 0744 07/22/17 0812 07/22/17 0907 07/22/17 0938  BP:  (!) 150/79  (!) 145/75  Pulse: (!) 106 (!) 103  (!) 108  Resp: 19 (!) 22  (!) 23  Temp:  98.4 F (36.9 C)    TempSrc:  Oral    SpO2: 93% 94% (!) 88% 95%  Weight:      Height:        Intake/Output Summary (Last 24 hours) at 07/22/2017 1017 Last data filed at 07/22/2017 0450 Gross per 24 hour  Intake 150 ml  Output 975 ml  Net -825 ml   Filed Weights   07/21/17 0859 07/21/17 1836 07/22/17 0500  Weight: 122.5 kg (270 lb) 114.5 kg (252 lb 6.8 oz) 113.6 kg (250 lb 7.1 oz)    Examination:  General exam: Appears in mild distress secondary to shortness of breath Respiratory system: Bilateral decreased breath sound at bases with scattered wheezing, tachypneic Cardiovascular system: S1 & S2 heard, rate controlled.  Gastrointestinal system: Abdomen is nondistended, soft and nontender. Normal bowel sounds heard. Extremities: No cyanosis, clubbing, edema   Data Reviewed: I have personally reviewed following labs and imaging studies  CBC: Recent Labs  Lab 07/21/17 0908 07/22/17 0412  WBC 10.3 8.5  HGB 15.5 14.7  HCT 50.2 48.2  MCV 94.0 95.3  PLT 202 217   Basic Metabolic Panel: Recent Labs  Lab 07/21/17 0908 07/22/17 0412  NA 135 134*  K 4.2 4.7  CL 98* 95*  CO2 30 33*  GLUCOSE 187* 239*  BUN <5* 11  CREATININE 0.79 0.74  CALCIUM 9.0 8.7*   GFR: Estimated Creatinine Clearance: 135.8 mL/min (by C-G formula based on SCr of 0.74 mg/dL). Liver Function Tests: Recent Labs  Lab 07/22/17 0412  AST 14*  ALT 22  ALKPHOS 47  BILITOT 0.7  PROT 8.7*  ALBUMIN 3.7   No results for input(s): LIPASE, AMYLASE in the last 168 hours. No results for input(s): AMMONIA in the last 168 hours. Coagulation Profile: No results for  input(s): INR, PROTIME in the last 168 hours. Cardiac Enzymes: Recent Labs  Lab 07/21/17 0908  TROPONINI <0.03   BNP (last 3 results) No results for input(s): PROBNP in the last 8760 hours. HbA1C: No results for input(s): HGBA1C in the last 72 hours. CBG: Recent Labs  Lab 07/21/17 1702 07/21/17 1851 07/21/17 2050 07/22/17 0354 07/22/17 0810  GLUCAP 297* 304* 288* 217* 301*   Lipid Profile: No results for input(s): CHOL, HDL, LDLCALC, TRIG, CHOLHDL, LDLDIRECT in the last 72 hours. Thyroid Function Tests: No results for input(s): TSH, T4TOTAL, FREET4, T3FREE, THYROIDAB in the last 72 hours. Anemia Panel: No results for input(s): VITAMINB12, FOLATE, FERRITIN, TIBC, IRON, RETICCTPCT in the last 72 hours. Sepsis Labs: No results for input(s): PROCALCITON, LATICACIDVEN in the last 168 hours.  Recent Results (from the past 240 hour(s))  MRSA PCR Screening     Status: None   Collection Time: 07/21/17  6:31 PM  Result Value Ref Range Status   MRSA by PCR NEGATIVE NEGATIVE Final    Comment:        The GeneXpert MRSA Assay (FDA approved for NASAL specimens only), is one component of a comprehensive MRSA colonization surveillance program. It is not intended to diagnose MRSA infection nor to guide or monitor treatment for MRSA infections.          Radiology Studies: Dg Chest Port 1 View  Result Date: 07/21/2017 CLINICAL DATA:  Shortness of breath. EXAM: PORTABLE CHEST 1 VIEW COMPARISON:  06/07/2017, 05/26/2017 and 08/08/2016 and chest CT dated 12/12/2013 FINDINGS: Heart size and pulmonary vascularity are normal. There is slight chronic accentuation of the interstitial markings at the lung bases and the patient has bilateral prominent pericardial fat pads as demonstrated by the prior CT scan. No acute infiltrates or effusions.  No acute bone abnormality. IMPRESSION: No active disease. Electronically Signed   By: Francene BoyersJames  Maxwell M.D.   On: 07/21/2017 09:29         Scheduled Meds: . atorvastatin  20 mg Oral Daily  . azithromycin  500 mg Oral Daily  . bictegravir-emtricitabine-tenofovir AF  1 tablet Oral Q1500  . enoxaparin (LOVENOX) injection  60 mg Subcutaneous Q24H  . insulin aspart  0-20 Units Subcutaneous TID WC  . insulin aspart  0-5 Units Subcutaneous QHS  . insulin aspart  3 Units Subcutaneous TID WC  . insulin glargine  13 Units Subcutaneous Daily  . ipratropium-albuterol  3 mL Nebulization Q6H  . methylPREDNISolone (SOLU-MEDROL) injection  40 mg Intravenous Q8H  . mometasone-formoterol  2 puff Inhalation BID  . pantoprazole  40 mg Oral BID AC  . tiotropium  18 mcg Inhalation Daily   Continuous Infusions:   LOS: 0 days        Glade LloydKshitiz Anetria Harwick, MD [facility] Pager [phone]  If 7PM-7AM, please contact night-coverage [email or web address] Password [facility] 07/22/2017, 10:17 AM

## 2017-07-22 NOTE — Progress Notes (Signed)
  Echocardiogram 2D Echocardiogram has been performed.  Chris [name], Chris [name] 07/22/2017, 11:37 AM

## 2017-07-22 NOTE — Progress Notes (Signed)
Discussed with pt that he is assigned a clear liquid diet. Pt,s wife feeding him popcorn, chips.

## 2017-07-22 NOTE — Progress Notes (Addendum)
Pt heart rate 120-150 afib with RVR did an EKG no PRN meds, paged Doctor for instructions. Pt remained asymptomatic.

## 2017-07-22 NOTE — Plan of Care (Signed)
Patient requires further about why is NPO while on the Bipap mask. Patient was educated about current diet status but requires further teaching. Patient was also educated on patient safety and to call for assistance when attempting to get up from bed. Will continue to educate and assess.

## 2017-07-22 NOTE — Progress Notes (Signed)
Inpatient Diabetes Program Recommendations  AACE/[name]: New Consensus Statement on Inpatient Glycemic Control (2015)  Target Ranges:  Prepandial:   less than 140 mg/dL      Peak postprandial:   less than 180 mg/dL (1-2 hours)      Critically ill patients:  140 - 180 mg/dL   Lab Results  Component Value Date   GLUCAP 301 (H) 07/22/2017   HGBA1C 8.8 06/01/2017    Review of Glycemic Control Results for Douglass RiversWARD, Wendle A JR. (MRN [id number]) as of 07/22/2017 10:09  Ref. Range 07/21/2017 18:51 07/21/2017 20:50 07/22/2017 03:54 07/22/2017 04:12 07/22/2017 04:45 07/22/2017 08:10  Glucose-Capillary Latest Ref Range: 65 - 99 mg/dL 782304 (H) 956288 (H) 213217 (H)   301 (H)   Diabetes history: Type 2 DM Outpatient Diabetes medications: Lantus 25 Units HS, Metformin 1,000mg  BID Current orders for Inpatient glycemic control: Novolog 0-20 TID resistant correction, Novolog 0-5 Units HS, Novolog 3 Units TID with meals, Lantus 13 Units QD.  Inpatient Diabetes Program Recommendations:    Noted morning order changes, given FBS was 301mg /dL. Given obseity and patient on solumedrol 40 mg Q8h consider increasing Lantus to 25 Units QD (113.6kg X 0.2 units/kg). Also, if post prandials continue to be elevated >200, consider increasing Novolog to 5 units TID with meals.  Thanks, Lujean RaveLauren Ercil Cassis, MSN, RNC-OB Diabetes Coordinator [phone] (8a-5p)

## 2017-07-22 NOTE — Progress Notes (Signed)
Pt instructed to stay in bed due to dizziness, was given call bell and shown how to call for assistance.

## 2017-07-22 NOTE — Care Management Note (Addendum)
Case Management Note  Patient Details  Name: Chris RiversJerome A Gayton Jr. MRN: [id number] Date of Birth: 12/14/1965  Subjective/Objective:  From home with wife,has home oxygen prn with [facility] (2 liters) , nebulizer machine, presents with acute hypoxic resp failure secondary to acute copd ex, HTN, sats dropped to 83 conts on BIPAP,  has hx of 042 (HART meds)  HLD, OSA, GERD, conts on solumedrol.  He states he does not think he needs HH services.  NCM informed him NCM will cont to follow to see what his dc needs will be.   12/14 1209 Chris Capeeborah Aoi Kouns RN, BSN- conts on cardizem drip, Heart rate up/down, bipap on/off.                Action/Plan: NCM will follow for dc needs.   Expected Discharge Date:  07/24/17               Expected Discharge Plan:  Home/Self Care  In-House Referral:     Discharge planning Services  CM Consult  Post Acute Care Choice:    Choice offered to:     DME Arranged:    DME Agency:     HH Arranged:    HH Agency:     Status of Service:  In process, will continue to follow  If discussed at Long Length of Stay Meetings, dates discussed:    Additional Comments:  Chris [name], Chris Imran Clinton, RN 07/22/2017, 9:41 AM

## 2017-07-22 NOTE — Progress Notes (Signed)
Pt HR 120-140. NP Craige CottaKirby notified. Orders for EKG and Lopressor 5mg  IV ordered. EKG performed and Lopressor administered as well. Will continue to monitor and assess.

## 2017-07-23 ENCOUNTER — Inpatient Hospital Stay (HOSPITAL_COMMUNITY): Payer: Medicare HMO

## 2017-07-23 ENCOUNTER — Encounter (HOSPITAL_COMMUNITY): Payer: Self-pay | Admitting: Radiology

## 2017-07-23 DIAGNOSIS — J9611 Chronic respiratory failure with hypoxia: Secondary | ICD-10-CM

## 2017-07-23 DIAGNOSIS — J441 Chronic obstructive pulmonary disease with (acute) exacerbation: Principal | ICD-10-CM

## 2017-07-23 DIAGNOSIS — I5041 Acute combined systolic (congestive) and diastolic (congestive) heart failure: Secondary | ICD-10-CM

## 2017-07-23 DIAGNOSIS — J9612 Chronic respiratory failure with hypercapnia: Secondary | ICD-10-CM

## 2017-07-23 DIAGNOSIS — J9621 Acute and chronic respiratory failure with hypoxia: Secondary | ICD-10-CM

## 2017-07-23 DIAGNOSIS — I10 Essential (primary) hypertension: Secondary | ICD-10-CM

## 2017-07-23 DIAGNOSIS — I4891 Unspecified atrial fibrillation: Secondary | ICD-10-CM

## 2017-07-23 LAB — CBC WITH DIFFERENTIAL/PLATELET
BASOS PCT: 0 %
Basophils Absolute: 0 10*3/uL (ref 0.0–0.1)
EOS ABS: 0 10*3/uL (ref 0.0–0.7)
Eosinophils Relative: 0 %
HCT: 49.6 % (ref 39.0–52.0)
Hemoglobin: 14.8 g/dL (ref 13.0–17.0)
Lymphocytes Relative: 8 %
Lymphs Abs: 1.1 10*3/uL (ref 0.7–4.0)
MCH: 28.7 pg (ref 26.0–34.0)
MCHC: 29.8 g/dL — AB (ref 30.0–36.0)
MCV: 96.3 fL (ref 78.0–100.0)
MONO ABS: 0.7 10*3/uL (ref 0.1–1.0)
Monocytes Relative: 5 %
NEUTROS ABS: 12.4 10*3/uL — AB (ref 1.7–7.7)
Neutrophils Relative %: 87 %
PLATELETS: 208 10*3/uL (ref 150–400)
RBC: 5.15 MIL/uL (ref 4.22–5.81)
RDW: 12.5 % (ref 11.5–15.5)
WBC: 14.2 10*3/uL — ABNORMAL HIGH (ref 4.0–10.5)

## 2017-07-23 LAB — BASIC METABOLIC PANEL
ANION GAP: 7 (ref 5–15)
BUN: 21 mg/dL — AB (ref 6–20)
CALCIUM: 8.6 mg/dL — AB (ref 8.9–10.3)
CO2: 33 mmol/L — ABNORMAL HIGH (ref 22–32)
Chloride: 93 mmol/L — ABNORMAL LOW (ref 101–111)
Creatinine, Ser: 0.86 mg/dL (ref 0.61–1.24)
GFR calc Af Amer: >60 mL/min (ref >60)
GLUCOSE: 250 mg/dL — AB (ref 65–99)
Potassium: 5.1 mmol/L (ref 3.5–5.1)
SODIUM: 133 mmol/L — AB (ref 135–145)

## 2017-07-23 LAB — GLUCOSE, CAPILLARY
GLUCOSE-CAPILLARY: 357 mg/dL — AB (ref 65–99)
GLUCOSE-CAPILLARY: 325 mg/dL — AB (ref 65–99)
GLUCOSE-CAPILLARY: 304 mg/dL — AB (ref 65–99)
Glucose-Capillary: 248 mg/dL — ABNORMAL HIGH (ref 65–99)

## 2017-07-23 LAB — MAGNESIUM: MAGNESIUM: 2.5 mg/dL — AB (ref 1.7–2.4)

## 2017-07-23 MED ORDER — IOPAMIDOL (ISOVUE-370) INJECTION 76%
INTRAVENOUS | Status: AC
  Administered 2017-07-23: 80 mL
  Filled 2017-07-23: qty 100

## 2017-07-23 MED ORDER — ENOXAPARIN SODIUM 60 MG/0.6ML ~~LOC~~ SOLN
55.0000 mg | SUBCUTANEOUS | Status: DC
  Administered 2017-07-23: 55 mg via SUBCUTANEOUS
  Filled 2017-07-23: qty 0.6

## 2017-07-23 MED ORDER — FUROSEMIDE 10 MG/ML IJ SOLN
40.0000 mg | Freq: Two times a day (BID) | INTRAMUSCULAR | Status: DC
  Administered 2017-07-23 – 2017-07-27 (×8): 40 mg via INTRAVENOUS
  Filled 2017-07-23 (×8): qty 4

## 2017-07-23 NOTE — Plan of Care (Signed)
Patient was placed on a cardizem drip at 2300 and he demonstrated understanding of why he was placed on cardizem drip. He is stable and oriented and reports no pain. Will continue to monitor and educated patient.
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
## 2017-07-23 NOTE — Progress Notes (Signed)
Patient removed from Bipap at 2015 to eat left overs from dinner a half a plate of spaghetti . He was educated that if was going to eat he will have to stay off Bipap and on nasal canula at 6L. Immediately after finishing his food patient demanded to get back on Bipap. Patient began to yell and get frustrated. Patient was placed back on the Bipap against this nurse's education to wait to be placed back on. Will continue to monitor and assess.  Patient is currently in stable condition and alert and oriented.

## 2017-07-23 NOTE — Progress Notes (Signed)
Patient ID: Chris [name]., male   DOB: June 30, 1966, 51 y.o.   MRN: [id number]  PROGRESS NOTE    [name] Sternberg [name].  WUJ:[id number] DOB: 12/21/1965 DOA: 07/21/2017 PCP: Quentin Angst, MD   Brief Narrative:  51 year old male with history of diabetes, GERD, HIV, tobacco use, COPD on oxygen only as needed at home presented with shortness of breath.  He was admitted with COPD exacerbation started on intravenous steroids.  Patient was put on intravenous Cardizem drip overnight for new onset atrial fibrillation with rapid ventricular rate.   Assessment & Plan:   Active Problems:   HIV disease (HCC)   COPD  GOLD III, still smoking   Steroid-induced hyperglycemia   Chronic respiratory failure with hypoxia and hypercapnia (HCC)   DM (diabetes mellitus) (HCC)   Obstructive sleep apnea syndrome   Essential hypertension, benign   Atopic eczema   Type 2 diabetes mellitus with complication (HCC)   Morbid (severe) obesity due to excess calories (HCC)   COPD with acute exacerbation (HCC)   Depression   GERD (gastroesophageal reflux disease)   Acute on chronic respiratory failure with hypoxia (HCC)   Acute hypoxic respiratory failure -Patient was on BiPAP overnight.  This morning he is on nasal cannula oxygen.  Wean off oxygen as able  Probable COPD exacerbation -Continue current dosing of supplemental as patient is still significantly hypoxic.  Continue Dulera.  Continue duo nebs -CT of the chest to rule out PE and other pulmonary parenchymal abnormalities -If respiratory status does not improve, will call pulmonary consult  New onset atrial fibrillation with rapid ventricular rate - Currently on Cardizem drip.  Cardiology consulted  Leukocytosis -Probably reactive.  Repeat a.m. labs  Tobacco abuse -Counseled about cessation  Diabetes mellitus type 2 -Hold oral meds.  Continue Lantus and SSI  Hyperlipidemia -Continue statins  Hypertension -Monitor.  Not on oral  antihypertensives at home  HIV -Continue current home antiretroviral therapy.  Outpatient follow-up with primary care provider  Obstructive sleep apnea -Continue CPAP at night   GERD - continue PPI  DVT prophylaxis: Lovenox Code Status:  Full Family Communication: None at bedside Disposition Plan: Home in 2-3 days  Consultants: Cardiology  Procedures: Echo on 07/22/2017 Study Conclusions  - Procedure narrative: Transthoracic echocardiography. Image   quality was poor. The study was technically difficult, as a   result of poor acoustic windows, poor sound wave transmission,   and body habitus. - Left ventricle: The cavity size was normal. Wall thickness was   increased in a pattern of moderate LVH. Systolic function was   normal. The estimated ejection fraction was in the range of 60%   to 65%. Wall motion was normal; there were no regional wall   motion abnormalities. Doppler parameters are consistent with   abnormal left ventricular relaxation (grade 1 diastolic   dysfunction). The E/e&' ratio is between 8-15, suggesting   indeterminate LV filling pressure. - Mitral valve: Mildly thickened leaflets . There was mild   regurgitation. - Left atrium: The atrium was normal in size. - Inferior vena cava: The vessel was dilated. The respirophasic   diameter changes were blunted (< 50%), consistent with elevated   central venous pressure.  Impressions:  - Technically difficult study. Compared to a prior study in 08/2015,   the LVEF is unchanged at 60-65%. There is now grade 1 DD with   indetermiante LV filling pressure.  Antimicrobials: Zithromax from 07/21/2017 onwards   Subjective: Patient seen and examined at bedside.  Patient feels only slightly better, still short of breath and coughing.  No chest pain  No overnight fever or vomiting.  Objective: Vitals:   07/23/17 0630 07/23/17 0740 07/23/17 0756 07/23/17 0804  BP: 131/88 (!) 154/77    Pulse: 98 96    Resp:  15 (!) 26    Temp:   (!) 96.5 F (35.8 C)   TempSrc:   Axillary   SpO2: 97% 98%  90%  Weight:      Height:        Intake/Output Summary (Last 24 hours) at 07/23/2017 0939 Last data filed at 07/23/2017 0831 Gross per 24 hour  Intake 349.33 ml  Output 1250 ml  Net -900.67 ml   Filed Weights   07/21/17 1836 07/22/17 0500 07/23/17 0452  Weight: 114.5 kg (252 lb 6.8 oz) 113.6 kg (250 lb 7.1 oz) 113.2 kg (249 lb 9 oz)    Examination:  General exam: Appears in mild distress secondary to shortness of breath; off BiPAP currently Respiratory system: Bilateral decreased breath sound at bases with scattered wheezing, tachypneic Cardiovascular system: S1 & S2 heard, intermittent tachycardia.  Gastrointestinal system: Abdomen is nondistended, soft and nontender. Normal bowel sounds heard. Extremities: No cyanosis, clubbing, edema  CNS: No focal neurologic deficit, moving extremities Lymph: No cervical lymphadenopathy Skin: No rash, petechiae  Data Reviewed: I have personally reviewed following labs and imaging studies  CBC: Recent Labs  Lab 07/21/17 0908 07/22/17 0412 07/23/17 0514  WBC 10.3 8.5 14.2*  NEUTROABS  --   --  12.4*  HGB 15.5 14.7 14.8  HCT 50.2 48.2 49.6  MCV 94.0 95.3 96.3  PLT 202 217 208   Basic Metabolic Panel: Recent Labs  Lab 07/21/17 0908 07/22/17 0412 07/23/17 0514  NA 135 134* 133*  K 4.2 4.7 5.1  CL 98* 95* 93*  CO2 30 33* 33*  GLUCOSE 187* 239* 250*  BUN <5* 11 21*  CREATININE 0.79 0.74 0.86  CALCIUM 9.0 8.7* 8.6*  MG  --   --  2.5*   GFR: Estimated Creatinine Clearance: 126.1 mL/min (by C-G formula based on SCr of 0.86 mg/dL). Liver Function Tests: Recent Labs  Lab 07/22/17 0412  AST 14*  ALT 22  ALKPHOS 47  BILITOT 0.7  PROT 8.7*  ALBUMIN 3.7   No results for input(s): LIPASE, AMYLASE in the last 168 hours. No results for input(s): AMMONIA in the last 168 hours. Coagulation Profile: No results for input(s): INR, PROTIME in the  last 168 hours. Cardiac Enzymes: Recent Labs  Lab 07/21/17 0908  TROPONINI <0.03   BNP (last 3 results) No results for input(s): PROBNP in the last 8760 hours. HbA1C: No results for input(s): HGBA1C in the last 72 hours. CBG: Recent Labs  Lab 07/22/17 0354 07/22/17 0810 07/22/17 1213 07/22/17 1707 07/22/17 2022  GLUCAP 217* 301* 232* 171* 197*   Lipid Profile: No results for input(s): CHOL, HDL, LDLCALC, TRIG, CHOLHDL, LDLDIRECT in the last 72 hours. Thyroid Function Tests: No results for input(s): TSH, T4TOTAL, FREET4, T3FREE, THYROIDAB in the last 72 hours. Anemia Panel: No results for input(s): VITAMINB12, FOLATE, FERRITIN, TIBC, IRON, RETICCTPCT in the last 72 hours. Sepsis Labs: No results for input(s): PROCALCITON, LATICACIDVEN in the last 168 hours.  Recent Results (from the past 240 hour(s))  MRSA PCR Screening     Status: None   Collection Time: 07/21/17  6:31 PM  Result Value Ref Range Status   MRSA by PCR NEGATIVE NEGATIVE Final    Comment:  The GeneXpert MRSA Assay (FDA approved for NASAL specimens only), is one component of a comprehensive MRSA colonization surveillance program. It is not intended to diagnose MRSA infection nor to guide or monitor treatment for MRSA infections.          Radiology Studies: X-ray Chest Pa And Lateral  Result Date: 07/23/2017 CLINICAL DATA:  COPD. EXAM: CHEST  2 VIEW COMPARISON:  07/21/2017 . FINDINGS: Mediastinum hilar structures normal. Cardiomegaly with mild pulmonary vascular prominence and bilateral interstitial prominence suggesting mild CHF. No pleural effusion or pneumothorax. No acute bony abnormality. IMPRESSION: Cardiomegaly with mild pulmonary vascular congestion and bilateral interstitial prominence suggesting mild CHF. Electronically Signed   By: Maisie Fushomas  Register   On: 07/23/2017 07:51        Scheduled Meds: . atorvastatin  20 mg Oral Daily  . azithromycin  500 mg Oral Daily  .  bictegravir-emtricitabine-tenofovir AF  1 tablet Oral Q1500  . enoxaparin (LOVENOX) injection  55 mg Subcutaneous Q24H  . insulin aspart  0-20 Units Subcutaneous TID WC  . insulin aspart  0-5 Units Subcutaneous QHS  . insulin aspart  5 Units Subcutaneous TID WC  . insulin glargine  25 Units Subcutaneous Daily  . ipratropium-albuterol  3 mL Nebulization Q6H  . methylPREDNISolone (SOLU-MEDROL) injection  60 mg Intravenous Q8H  . mometasone-formoterol  2 puff Inhalation BID  . pantoprazole  40 mg Oral BID AC  . tiotropium  18 mcg Inhalation Daily   Continuous Infusions: . diltiazem (CARDIZEM) infusion 12.5 mg/hr (07/23/17 0831)     LOS: 1 day        Glade LloydKshitiz Zenaida Tesar, MD [facility] Pager [phone]  If 7PM-7AM, please contact night-coverage [email or web address] Password [email or web address] 07/23/2017, 9:39 AM

## 2017-07-24 DIAGNOSIS — I5031 Acute diastolic (congestive) heart failure: Secondary | ICD-10-CM

## 2017-07-24 DIAGNOSIS — R739 Hyperglycemia, unspecified: Secondary | ICD-10-CM

## 2017-07-24 DIAGNOSIS — T380X5A Adverse effect of glucocorticoids and synthetic analogues, initial encounter: Secondary | ICD-10-CM

## 2017-07-24 LAB — BASIC METABOLIC PANEL
ANION GAP: 10 (ref 5–15)
BUN: 22 mg/dL — ABNORMAL HIGH (ref 6–20)
CHLORIDE: 93 mmol/L — AB (ref 101–111)
CO2: 34 mmol/L — AB (ref 22–32)
Calcium: 9 mg/dL (ref 8.9–10.3)
Creatinine, Ser: 0.89 mg/dL (ref 0.61–1.24)
GFR calc non Af Amer: >60 mL/min (ref >60)
Glucose, Bld: 262 mg/dL — ABNORMAL HIGH (ref 65–99)
Potassium: 4.8 mmol/L (ref 3.5–5.1)
Sodium: 137 mmol/L (ref 135–145)

## 2017-07-24 LAB — CBC WITH DIFFERENTIAL/PLATELET
BASOS PCT: 0 %
Basophils Absolute: 0 10*3/uL (ref 0.0–0.1)
EOS PCT: 0 %
Eosinophils Absolute: 0 10*3/uL (ref 0.0–0.7)
HEMATOCRIT: 49.2 % (ref 39.0–52.0)
HEMOGLOBIN: 14.8 g/dL (ref 13.0–17.0)
LYMPHS ABS: 1.1 10*3/uL (ref 0.7–4.0)
Lymphocytes Relative: 8 %
MCH: 29.1 pg (ref 26.0–34.0)
MCHC: 30.1 g/dL (ref 30.0–36.0)
MCV: 96.7 fL (ref 78.0–100.0)
MONOS PCT: 7 %
Monocytes Absolute: 1 10*3/uL (ref 0.1–1.0)
NEUTROS PCT: 85 %
Neutro Abs: 12.1 10*3/uL — ABNORMAL HIGH (ref 1.7–7.7)
Platelets: 209 10*3/uL (ref 150–400)
RBC: 5.09 MIL/uL (ref 4.22–5.81)
RDW: 12.4 % (ref 11.5–15.5)
WBC: 14.2 10*3/uL — AB (ref 4.0–10.5)

## 2017-07-24 LAB — MAGNESIUM: Magnesium: 2.7 mg/dL — ABNORMAL HIGH (ref 1.7–2.4)

## 2017-07-24 LAB — GLUCOSE, CAPILLARY
GLUCOSE-CAPILLARY: 353 mg/dL — AB (ref 65–99)
GLUCOSE-CAPILLARY: 398 mg/dL — AB (ref 65–99)
Glucose-Capillary: 269 mg/dL — ABNORMAL HIGH (ref 65–99)
Glucose-Capillary: 478 mg/dL — ABNORMAL HIGH (ref 65–99)

## 2017-07-24 LAB — PROCALCITONIN: Procalcitonin: 0.12 ng/mL

## 2017-07-24 LAB — HEPARIN LEVEL (UNFRACTIONATED): Heparin Unfractionated: 0.31 IU/mL (ref 0.30–0.70)

## 2017-07-24 MED ORDER — SALINE SPRAY 0.65 % NA SOLN
1.0000 | NASAL | Status: DC | PRN
  Filled 2017-07-24: qty 44

## 2017-07-24 MED ORDER — HEPARIN BOLUS VIA INFUSION
4000.0000 [IU] | Freq: Once | INTRAVENOUS | Status: AC
  Administered 2017-07-24: 4000 [IU] via INTRAVENOUS
  Filled 2017-07-24: qty 4000

## 2017-07-24 MED ORDER — INSULIN GLARGINE 100 UNIT/ML ~~LOC~~ SOLN
20.0000 [IU] | Freq: Once | SUBCUTANEOUS | Status: AC
  Administered 2017-07-24: 20 [IU] via SUBCUTANEOUS
  Filled 2017-07-24: qty 0.2

## 2017-07-24 MED ORDER — HEPARIN (PORCINE) IN NACL 100-0.45 UNIT/ML-% IJ SOLN
1500.0000 [IU]/h | INTRAMUSCULAR | Status: DC
  Administered 2017-07-24: 1500 [IU]/h via INTRAVENOUS
  Administered 2017-07-25 – 2017-07-27 (×4): 1600 [IU]/h via INTRAVENOUS
  Filled 2017-07-24 (×5): qty 250

## 2017-07-24 NOTE — Progress Notes (Signed)
Inpatient Diabetes Program Recommendations  AACE/[name]: New Consensus Statement on Inpatient Glycemic Control (2015)  Target Ranges:  Prepandial:   less than 140 mg/dL      Peak postprandial:   less than 180 mg/dL (1-2 hours)      Critically ill patients:  140 - 180 mg/dL   Lab Results  Component Value Date   GLUCAP 353 (H) 07/24/2017   HGBA1C 8.8 06/01/2017    Review of Glycemic Control Results for Chris [name], Chris A JR. (MRN [id number]) as of 07/24/2017 12:39  Ref. Range 07/23/2017 15:57 07/23/2017 21:00 07/24/2017 04:14 07/24/2017 07:31 07/24/2017 12:33  Glucose-Capillary Latest Ref Range: 65 - 99 mg/dL 409325 (H) 811304 (H)  914269 (H) 353 (H)   Diabetes history: Type 2 DM Outpatient Diabetes medications: Lantus 25 Units HS, Metformin 1,000mg  BID Current orders for Inpatient glycemic control: Novolog resistant correction TID, Novolog 5 Units Meal coverage, Novolog 0-5 Units HS, and Lantus 25 Units QD  Inpatient Diabetes Program Recommendations:     Noted increase of Solumedrol to 60 mg Q8H. While on steroids, expect to see increases to BS. Recommend increasing Lantus to 32 Units QD and increase Novolog to 8 units TID with meals.  Thanks, Lujean RaveLauren Christean Silvestri, MSN, RNC-OB Diabetes Coordinator [phone] (8a-5p)

## 2017-07-24 NOTE — Plan of Care (Signed)
Patient needs some reinforcement about patient safety. He also needs reinforcement about his Bipap. Will continue to monitor and educated the patient.

## 2017-07-24 NOTE — Progress Notes (Signed)
Patient ID: Chris RiversJerome A Geigle Jr., male   DOB: 02/11/1966, 51 y.o.   MRN: [id number]  PROGRESS NOTE    [name]  WUJ:[id number] DOB: 10/09/1965 DOA: 07/21/2017 PCP: Quentin AngstJegede, Olugbemiga E, MD   Brief Narrative:  51 year old male with history of diabetes, GERD, HIV, tobacco use, COPD on oxygen only as needed at home presented with shortness of breath.  He was admitted with COPD exacerbation started on intravenous steroids.  Patient was put on intravenous Cardizem drip overnight for new onset atrial fibrillation with rapid ventricular rate.  07/24/17 - Patient seen. Discussed with the patient's Nurse extensively. Patient is still on Cardizem drip and intermittent BiPAP. Patient tells me there has been some improvement in his respiratory symptoms.    Assessment & Plan:   Active Problems:   HIV disease (HCC)   COPD  GOLD III, still smoking   Steroid-induced hyperglycemia   Chronic respiratory failure with hypoxia and hypercapnia (HCC)   DM (diabetes mellitus) (HCC)   Obstructive sleep apnea syndrome   Essential hypertension, benign   Atopic eczema   Type 2 diabetes mellitus with complication (HCC)   Morbid (severe) obesity due to excess calories (HCC)   COPD with acute exacerbation (HCC)   Depression   GERD (gastroesophageal reflux disease)   Acute on chronic respiratory failure with hypoxia (HCC)   Acute hypoxic respiratory failure - continue neb treatment and PRN BiPAP. - Continue Steroids.  Probable COPD exacerbation - See abover -Continue current dosing of supplemental as patient is still significantly hypoxic.  Continue Dulera.  Continue duo nebs -CT of the chest is negative for PE or acute findings.  New onset atrial fibrillation with rapid ventricular rate - Currently on Cardizem drip.  Cardiology consulted  Leukocytosis - ?Role of steroids. - Continue to monitor.  Tobacco abuse -Counseled about cessation  Diabetes mellitus type 2 -Hold oral meds.  Continue  Lantus and SSI - Optimize. Uncontrolled due to patient's habit (Consuming outside soda from family), and steroids. - Low threshold to change Lantus to bid  Hyperlipidemia -Continue statins  Hypertension -Monitor.  Not on oral antihypertensives at home  HIV -Continue current home antiretroviral therapy.  Outpatient follow-up with primary care provider  Obstructive sleep apnea -Continue CPAP at night   GERD - continue PPI  DVT prophylaxis: Lovenox Code Status:  Full Family Communication: None at bedside Disposition Plan: Home in 2-3 days  Consultants: Cardiology  Procedures: Echo on 07/22/2017 Study Conclusions  - Procedure narrative: Transthoracic echocardiography. Image   quality was poor. The study was technically difficult, as a   result of poor acoustic windows, poor sound wave transmission,   and body habitus. - Left ventricle: The cavity size was normal. Wall thickness was   increased in a pattern of moderate LVH. Systolic function was   normal. The estimated ejection fraction was in the range of 60%   to 65%. Wall motion was normal; there were no regional wall   motion abnormalities. Doppler parameters are consistent with   abnormal left ventricular relaxation (grade 1 diastolic   dysfunction). The E/e&' ratio is between 8-15, suggesting   indeterminate LV filling pressure. - Mitral valve: Mildly thickened leaflets . There was mild   regurgitation. - Left atrium: The atrium was normal in size. - Inferior vena cava: The vessel was dilated. The respirophasic   diameter changes were blunted (< 50%), consistent with elevated   central venous pressure.  Impressions:  - Technically difficult study. Compared to a prior study  in 08/2015,   the LVEF is unchanged at 60-65%. There is now grade 1 DD with   indetermiante LV filling pressure.  Antimicrobials: Zithromax from 07/21/2017 onwards   Subjective: Patient seen and examined at bedside.  Patient feels only  slightly better, still short of breath and coughing.  No chest pain  No overnight fever or vomiting.  Objective: Vitals:   07/24/17 0727 07/24/17 0836 07/24/17 1038 07/24/17 1234  BP: (!) 155/92   126/76  Pulse: 92  85 97  Resp: (!) 30  (!) 24 19  Temp: (!) 97.5 F (36.4 C)   97.9 F (36.6 C)  TempSrc: Axillary   Axillary  SpO2: 96% 96% 98% 97%  Weight:      Height:        Intake/Output Summary (Last 24 hours) at 07/24/2017 1350 Last data filed at 07/24/2017 1035 Gross per 24 hour  Intake 1191.58 ml  Output 3575 ml  Net -2383.42 ml   Filed Weights   07/22/17 0500 07/23/17 0452 07/24/17 0500  Weight: 113.6 kg (250 lb 7.1 oz) 113.2 kg (249 lb 9 oz) 112.7 kg (248 lb 7.3 oz)    Examination:  General exam: Not in distress. Awake and alert. Poor historian. Respiratory system: Bilateral decreased air entry with expiratory wheeze. Cardiovascular system: S1 & S2.  Gastrointestinal system: Abdomen is soft and non tender. Organs are not palpable.  Extremities: No leg edema  CNS: AAO X 3. Patient moves all limbs.  Data Reviewed: I have personally reviewed following labs and imaging studies  CBC: Recent Labs  Lab 07/21/17 0908 07/22/17 0412 07/23/17 0514 07/24/17 0414  WBC 10.3 8.5 14.2* 14.2*  NEUTROABS  --   --  12.4* 12.1*  HGB 15.5 14.7 14.8 14.8  HCT 50.2 48.2 49.6 49.2  MCV 94.0 95.3 96.3 96.7  PLT 202 217 208 209   Basic Metabolic Panel: Recent Labs  Lab 07/21/17 0908 07/22/17 0412 07/23/17 0514 07/24/17 0414  NA 135 134* 133* 137  K 4.2 4.7 5.1 4.8  CL 98* 95* 93* 93*  CO2 30 33* 33* 34*  GLUCOSE 187* 239* 250* 262*  BUN <5* 11 21* 22*  CREATININE 0.79 0.74 0.86 0.89  CALCIUM 9.0 8.7* 8.6* 9.0  MG  --   --  2.5* 2.7*   GFR: Estimated Creatinine Clearance: 121.5 mL/min (by C-G formula based on SCr of 0.89 mg/dL). Liver Function Tests: Recent Labs  Lab 07/22/17 0412  AST 14*  ALT 22  ALKPHOS 47  BILITOT 0.7  PROT 8.7*  ALBUMIN 3.7   No  results for input(s): LIPASE, AMYLASE in the last 168 hours. No results for input(s): AMMONIA in the last 168 hours. Coagulation Profile: No results for input(s): INR, PROTIME in the last 168 hours. Cardiac Enzymes: Recent Labs  Lab 07/21/17 0908  TROPONINI <0.03   BNP (last 3 results) No results for input(s): PROBNP in the last 8760 hours. HbA1C: No results for input(s): HGBA1C in the last 72 hours. CBG: Recent Labs  Lab 07/23/17 1247 07/23/17 1557 07/23/17 2100 07/24/17 0731 07/24/17 1233  GLUCAP 357* 325* 304* 269* 353*   Lipid Profile: No results for input(s): CHOL, HDL, LDLCALC, TRIG, CHOLHDL, LDLDIRECT in the last 72 hours. Thyroid Function Tests: No results for input(s): TSH, T4TOTAL, FREET4, T3FREE, THYROIDAB in the last 72 hours. Anemia Panel: No results for input(s): VITAMINB12, FOLATE, FERRITIN, TIBC, IRON, RETICCTPCT in the last 72 hours. Sepsis Labs: No results for input(s): PROCALCITON, LATICACIDVEN in the last 168  hours.  Recent Results (from the past 240 hour(s))  MRSA PCR Screening     Status: None   Collection Time: 07/21/17  6:31 PM  Result Value Ref Range Status   MRSA by PCR NEGATIVE NEGATIVE Final    Comment:        The GeneXpert MRSA Assay (FDA approved for NASAL specimens only), is one component of a comprehensive MRSA colonization surveillance program. It is not intended to diagnose MRSA infection nor to guide or monitor treatment for MRSA infections.          Radiology Studies: X-ray Chest Pa And Lateral  Result Date: 07/23/2017 CLINICAL DATA:  COPD. EXAM: CHEST  2 VIEW COMPARISON:  07/21/2017 . FINDINGS: Mediastinum hilar structures normal. Cardiomegaly with mild pulmonary vascular prominence and bilateral interstitial prominence suggesting mild CHF. No pleural effusion or pneumothorax. No acute bony abnormality. IMPRESSION: Cardiomegaly with mild pulmonary vascular congestion and bilateral interstitial prominence suggesting mild  CHF. Electronically Signed   By: Maisie Fus  Register   On: 07/23/2017 07:51   Ct Angio Chest Pe W Or Wo Contrast  Result Date: 07/23/2017 CLINICAL DATA:  Shortness of breath, COPD EXAM: CT ANGIOGRAPHY CHEST WITH CONTRAST TECHNIQUE: Multidetector CT imaging of the chest was performed using the standard protocol during bolus administration of intravenous contrast. Multiplanar CT image reconstructions and MIPs were obtained to evaluate the vascular anatomy. CONTRAST:  80mL ISOVUE-370 IOPAMIDOL (ISOVUE-370) INJECTION 76% COMPARISON:  None. FINDINGS: Cardiovascular: No filling defects in the pulmonary arteries to suggestpulmonary emboli. Heart is mildly enlarged. Aorta is normal caliber. Mediastinum/Nodes: No mediastinal, hilar, or axillary adenopathy. Trachea and esophagus are unremarkable. Thyroid unremarkable. Lungs/Pleura: Mild centrilobular and paraseptal emphysema. No confluent opacities or pleural effusions. Upper Abdomen: Imaging into the upper abdomen shows no acute findings. Musculoskeletal: Chest wall soft tissues are unremarkable. No acute bony abnormality. Review of the MIP images confirms the above findings. IMPRESSION: No evidence of pulmonary embolus. Mild emphysema. No acute findings. Electronically Signed   By: Charlett Nose M.D.   On: 07/23/2017 11:33        Scheduled Meds: . atorvastatin  20 mg Oral Daily  . azithromycin  500 mg Oral Daily  . bictegravir-emtricitabine-tenofovir AF  1 tablet Oral Q1500  . enoxaparin (LOVENOX) injection  55 mg Subcutaneous Q24H  . furosemide  40 mg Intravenous BID  . insulin aspart  0-20 Units Subcutaneous TID WC  . insulin aspart  0-5 Units Subcutaneous QHS  . insulin aspart  5 Units Subcutaneous TID WC  . insulin glargine  25 Units Subcutaneous Daily  . ipratropium-albuterol  3 mL Nebulization Q6H  . methylPREDNISolone (SOLU-MEDROL) injection  60 mg Intravenous Q8H  . mometasone-formoterol  2 puff Inhalation BID  . pantoprazole  40 mg Oral BID AC    . tiotropium  18 mcg Inhalation Daily   Continuous Infusions: . diltiazem (CARDIZEM) infusion 20 mg/hr (07/24/17 0649)     LOS: 2 days        Barnetta Chapel, MD [facility] Pager [phone]  If 7PM-7AM, please contact night-coverage [email or web address] Password [email or web address] 07/24/2017, 1:50 PM

## 2017-07-24 NOTE — Progress Notes (Addendum)
Progress Note  Patient Name: Chris RiversJerome A Moya Jr. Date of Encounter: 07/24/2017  Primary Cardiologist: New to Dr. Delton SeeNelson  Subjective   Breathing feels marginally better. No CP or palpitations.  Inpatient Medications    Scheduled Meds: . atorvastatin  20 mg Oral Daily  . azithromycin  500 mg Oral Daily  . bictegravir-emtricitabine-tenofovir AF  1 tablet Oral Q1500  . enoxaparin (LOVENOX) injection  55 mg Subcutaneous Q24H  . furosemide  40 mg Intravenous BID  . insulin aspart  0-20 Units Subcutaneous TID WC  . insulin aspart  0-5 Units Subcutaneous QHS  . insulin aspart  5 Units Subcutaneous TID WC  . insulin glargine  25 Units Subcutaneous Daily  . ipratropium-albuterol  3 mL Nebulization Q6H  . methylPREDNISolone (SOLU-MEDROL) injection  60 mg Intravenous Q8H  . mometasone-formoterol  2 puff Inhalation BID  . pantoprazole  40 mg Oral BID AC  . tiotropium  18 mcg Inhalation Daily   Continuous Infusions: . diltiazem (CARDIZEM) infusion 20 mg/hr (07/24/17 0649)   PRN Meds: acetaminophen **OR** acetaminophen, albuterol, bisacodyl, guaiFENesin, HYDROcodone-acetaminophen, ipratropium-albuterol, metoprolol tartrate, nicotine polacrilex, ondansetron **OR** ondansetron (ZOFRAN) IV, senna-docusate   Vital Signs    Vitals:   07/24/17 0700 07/24/17 0727 07/24/17 0836 07/24/17 1038  BP: (!) 144/79 (!) 155/92    Pulse: 97 92  85  Resp: 19 (!) 30  (!) 24  Temp:  (!) 97.5 F (36.4 C)    TempSrc:  Axillary    SpO2: 95% 96% 96% 98%  Weight:      Height:        Intake/Output Summary (Last 24 hours) at 07/24/2017 1136 Last data filed at 07/24/2017 1035 Gross per 24 hour  Intake 1191.58 ml  Output 3875 ml  Net -2683.42 ml   Filed Weights   07/22/17 0500 07/23/17 0452 07/24/17 0500  Weight: 250 lb 7.1 oz (113.6 kg) 249 lb 9 oz (113.2 kg) 248 lb 7.3 oz (112.7 kg)    Telemetry    atrial fib 90-1teens - Personally Reviewed  Physical Exam   GEN: No acute distress, obese  AAM comfortable on bipap.  HEENT: Normocephalic, atraumatic, sclera non-icteric. Neck: No JVD or bruits. Cardiac: RRR no murmurs, rubs, or gallops.  Radials/DP/PT 1+ and equal bilaterally.  Respiratory: Diminished BS bilaterally but no wheezes, rales, rhonchi. Breathing is unlabored on bipap GI: Rounded, slightly distended, BS +x 4. MS: no deformity. Extremities: No clubbing or cyanosis. Trace bilateral LE edema.  Neuro:  AAOx3. Follows commands. Psych:  Responds to questions appropriately with a normal affect.  Labs    Chemistry Recent Labs  Lab 07/22/17 0412 07/23/17 0514 07/24/17 0414  NA 134* 133* 137  K 4.7 5.1 4.8  CL 95* 93* 93*  CO2 33* 33* 34*  GLUCOSE 239* 250* 262*  BUN 11 21* 22*  CREATININE 0.74 0.86 0.89  CALCIUM 8.7* 8.6* 9.0  PROT 8.7*  --   --   ALBUMIN 3.7  --   --   AST 14*  --   --   ALT 22  --   --   ALKPHOS 47  --   --   BILITOT 0.7  --   --   GFRNONAA >60 >60 >60  GFRAA >60 >60 >60  ANIONGAP 6 7 10      Hematology Recent Labs  Lab 07/22/17 0412 07/23/17 0514 07/24/17 0414  WBC 8.5 14.2* 14.2*  RBC 5.06 5.15 5.09  HGB 14.7 14.8 14.8  HCT 48.2 49.6 49.2  Alfredia ClientNordstromulverNoralee Space>62.95Mar dstromWaterviewNoralee Space>62.95<BADPatria ManeTTAG sue IvanChristena Flake atria ManeElder NegusSan Gabriel Valley Medical Center17mAlfredia ClientNordstromMilledgevilleNoralee Space>62.95 Marisue IvanChristena Flake atria ManeElder NegusOakleaf Surgical Hospital67mAlfredia ClientNordstromBelvoirNoralee Space>62.95 Marisue IvanChristena Flake Patria ManeElder NegusSpecialty Surgery Laser Center54mAlfredia ClientNordstromEl Cerro MissionNoralee Space>62.95 Marisue IvanChristena Flake Patria ManeElder NegusDublin Eye Surgery Center LLC38mAlfredia ClientNordstromSpencerNoralee Space>62.95 Marisue IvanChristena Flake Patria ManeElder NegusSpectrum Health [facility]61mAlfredia ClientNordstromRipleyNoralee Space>62.95 Marisue IvanChristena Flake Patria ManeElder NegusTouchette Regional Hospital Inc53mAlfredia ClientNordstromNorthdaleNoralee Space>62.95 Marisue IvanChristena Flake Patria Mane  calcification, BNP 30 and troponin neg.  Assessment & Plan    1. Acute exacerbation of COPD with acute on chronic respiratory failure - per IM.  2. New onset atrial fibrillation - noted 07/22/17. CHADSVASC initially said to be 1 for DM but patient with new diagnosis of CHF as well as elevated BP without prior diagnosis of HTN, so may actually be 3. I will review plan with Dr. Delton See regarding anticoagulation. Remains in atrial fib at this time on IV diltiazem at 20mg /hr with borderline HR rate control. May be appropriate for level of illness. Could consider addition of selective beta blocker. Note HR at OV 06/23/17 was 110, higher than prior baseline in the 80s. This was in an unrelated visit, no EKG from that time. Will check TSH with next labs.  3. Acute diastolic CHF - although BNP was normal, CXR suggestive of possible edema, but CTA without edema. Echo showed elevated CVP. BUN and CO2 starting to bump. Remains on 40mg  IV Lasix BID with -4.8L out, weight 252->248 (270 appeared stated weight).  Consider changing to oral form today.  4. Elevated blood pressure without prior diagnosis of HTN - check TSH. Follow BP with anticipated med adjustment. Will review rate control/possible addition of selective beta blocker with MD.  For questions or updates, please contact [facility] Please consult [email or web address] for contact info under Cardiology/STEMI.  Signed, Laurann Montana, PA-C 07/24/2017, 11:36 AM    The patient was seen, examined and discussed with Ronie Spies, PA-C and I agree with the above.   Assessment/Plan: Acute respiratory failure in the settings of an acute COPD exacerbation Acute diastolic CHF New onset atrial fibrillation with RVR Hypertension  I would continue aggressive treatment for acute COPD. Continue cardizem drip to 20 mg/hr as he remains tachycardic and hypertensive. Unable to add BB as he is wheezing.  Continue Lasix 40 mg iv Q12H for one more day, good diuresis so far, stable K and crea. He remains mildly fluid overloaded. CHADS-VASc 3. Start anticoagulation now. We will follow, I anticipate spontaneous cardioversion once acute COPD is improved. LVEF is normal, normal LA size and only mild MR.   Tobias Alexander, MD 07/24/2017

## 2017-07-24 NOTE — Progress Notes (Signed)
ANTICOAGULATION CONSULT NOTE   Pharmacy Consult for heparin  Indication: atrial fibrillation  Allergies  Allergen Reactions  . Bactrim [Sulfamethoxazole-Trimethoprim] Hives    Patient Measurements: Heparin Dosing Weight: 96 kg  Vital Signs: Temp: 97.9 F (36.6 C) (12/14 1234) Temp Source: Axillary (12/14 1234) BP: 126/76 (12/14 1234) Pulse Rate: 97 (12/14 1234)  Labs: Recent Labs    07/22/17 0412 07/23/17 0514 07/24/17 0414  HGB 14.7 14.8 14.8  HCT 48.2 49.6 49.2  PLT 217 208 209  CREATININE 0.74 0.86 0.89    Estimated Creatinine Clearance: 121.5 mL/min (by C-G formula based on SCr of 0.89 mg/dL).   Medical History: Past Medical History:  Diagnosis Date  . COPD (chronic obstructive pulmonary disease) (HCC)   . Depression   . Diabetes mellitus without complication (HCC)   . Dyspnea   . Emphysema   . GERD (gastroesophageal reflux disease)   . HIV disease [facility](HCC)     Assessment: 51 yo male to start heparin infusion for atrial fibrillation with RVR. No known a/c PTA. CBC stable.   Goal of Therapy:  Heparin level 0.3-0.7 units/ml Monitor platelets by anticoagulation protocol: Yes   Plan:  1. Give 4000 units bolus x 1 2. Start heparin infusion at 1500 units/hr 3. Check anti-Xa level in 8 hours and daily while on heparin 4. Continue to monitor H&H and platelets  5. Consider transitioning to subq Lovenox due to ease of administration and less stringent monitoring   Pollyann SamplesAndy Givanni Staron, PharmD, BCPS 07/24/2017, 2:18 PM

## 2017-07-25 LAB — BASIC METABOLIC PANEL
Anion gap: 9 (ref 5–15)
BUN: 29 mg/dL — AB (ref 6–20)
CALCIUM: 8.9 mg/dL (ref 8.9–10.3)
CO2: 36 mmol/L — ABNORMAL HIGH (ref 22–32)
CREATININE: 0.89 mg/dL (ref 0.61–1.24)
Chloride: 93 mmol/L — ABNORMAL LOW (ref 101–111)
Glucose, Bld: 300 mg/dL — ABNORMAL HIGH (ref 65–99)
Potassium: 4.3 mmol/L (ref 3.5–5.1)
SODIUM: 138 mmol/L (ref 135–145)

## 2017-07-25 LAB — GLUCOSE, CAPILLARY
GLUCOSE-CAPILLARY: 289 mg/dL — AB (ref 65–99)
GLUCOSE-CAPILLARY: 372 mg/dL — AB (ref 65–99)
GLUCOSE-CAPILLARY: 410 mg/dL — AB (ref 65–99)
Glucose-Capillary: 385 mg/dL — ABNORMAL HIGH (ref 65–99)

## 2017-07-25 LAB — CBC
HCT: 49.4 % (ref 39.0–52.0)
Hemoglobin: 15 g/dL (ref 13.0–17.0)
MCH: 28.8 pg (ref 26.0–34.0)
MCHC: 30.4 g/dL (ref 30.0–36.0)
MCV: 95 fL (ref 78.0–100.0)
PLATELETS: 195 10*3/uL (ref 150–400)
RBC: 5.2 MIL/uL (ref 4.22–5.81)
RDW: 12.3 % (ref 11.5–15.5)
WBC: 14.4 10*3/uL — AB (ref 4.0–10.5)

## 2017-07-25 LAB — HEPARIN LEVEL (UNFRACTIONATED): Heparin Unfractionated: 0.39 IU/mL (ref 0.30–0.70)

## 2017-07-25 LAB — GLUCOSE, RANDOM: Glucose, Bld: 410 mg/dL — ABNORMAL HIGH (ref 65–99)

## 2017-07-25 LAB — TSH: TSH: 0.487 u[IU]/mL (ref 0.350–4.500)

## 2017-07-25 MED ORDER — INSULIN GLARGINE 100 UNIT/ML ~~LOC~~ SOLN
20.0000 [IU] | Freq: Two times a day (BID) | SUBCUTANEOUS | Status: DC
  Administered 2017-07-25 – 2017-07-26 (×3): 20 [IU] via SUBCUTANEOUS
  Filled 2017-07-25 (×3): qty 0.2

## 2017-07-25 MED ORDER — INSULIN ASPART 100 UNIT/ML ~~LOC~~ SOLN
20.0000 [IU] | Freq: Once | SUBCUTANEOUS | Status: AC
  Administered 2017-07-25: 20 [IU] via SUBCUTANEOUS

## 2017-07-25 NOTE — Progress Notes (Signed)
Patient ID: Chris RiversJerome A Delafuente Jr., male   DOB: 05/15/1966, 51 y.o.   MRN: [id number]  PROGRESS NOTE    [name]  WUJ:[id number] DOB: 01/28/1966 DOA: 07/21/2017 PCP: Quentin AngstJegede, Olugbemiga E, MD   Brief Narrative:  51 year old male with history of diabetes, GERD, HIV, tobacco use, COPD on oxygen only as needed at home presented with shortness of breath.  He was admitted with COPD exacerbation started on intravenous steroids.  Patient was put on intravenous Cardizem drip overnight for new onset atrial fibrillation with rapid ventricular rate.  07/25/17 - Patient seen alongside patient's Wife. Updated patient and patient's wife. Patient is still on Cardizem drip and intermittent BiPAP. Patient respiratory symptoms continue to improve. Cardiology is managing the atrial fibrillation. Blood sugar needs to be optimized. Counseled to quit cigarettes.    Assessment & Plan:   Active Problems:   HIV disease (HCC)   COPD  GOLD III, still smoking   Steroid-induced hyperglycemia   Chronic respiratory failure with hypoxia and hypercapnia (HCC)   DM (diabetes mellitus) (HCC)   Obstructive sleep apnea syndrome   Essential hypertension, benign   Atopic eczema   Type 2 diabetes mellitus with complication (HCC)   Morbid (severe) obesity due to excess calories (HCC)   COPD with acute exacerbation (HCC)   Depression   GERD (gastroesophageal reflux disease)   Acute on chronic respiratory failure with hypoxia (HCC)   Acute hypoxic respiratory failure - continue neb treatment and PRN BiPAP. - Continue Steroids.  Probable COPD exacerbation - See above. -Continue current dosing of supplemental as patient is still significantly hypoxic.  Continue Dulera.  Continue duo nebs -CT of the chest is negative for PE or acute findings.  New onset atrial fibrillation with rapid ventricular rate - Currently on Cardizem and heparin drip.  Cardiology consulted  Leukocytosis - ?Role of steroids. - Continue to  monitor.  Tobacco abuse -Counseled about cessation  Diabetes mellitus type 2 -Hold oral meds.  Increase [place] Lantus to 20 Units bid. - Optimize. Uncontrolled due to patient's habit (Consuming outside soda from family), and steroids.  Hyperlipidemia -Continue statins  Hypertension -Monitor.  Not on oral antihypertensives at home  HIV -Continue current home antiretroviral therapy.  Outpatient follow-up with primary care provider  Obstructive sleep apnea -Continue CPAP at night   GERD - continue PPI  DVT prophylaxis: Lovenox Code Status:  Full Family Communication: None at bedside Disposition Plan: Home in 2-3 days  Consultants: Cardiology  Procedures: Echo on 07/22/2017 Study Conclusions  - Procedure narrative: Transthoracic echocardiography. Image   quality was poor. The study was technically difficult, as a   result of poor acoustic windows, poor sound wave transmission,   and body habitus. - Left ventricle: The cavity size was normal. Wall thickness was   increased in a pattern of moderate LVH. Systolic function was   normal. The estimated ejection fraction was in the range of 60%   to 65%. Wall motion was normal; there were no regional wall   motion abnormalities. Doppler parameters are consistent with   abnormal left ventricular relaxation (grade 1 diastolic   dysfunction). The E/e&' ratio is between 8-15, suggesting   indeterminate LV filling pressure. - Mitral valve: Mildly thickened leaflets . There was mild   regurgitation. - Left atrium: The atrium was normal in size. - Inferior vena cava: The vessel was dilated. The respirophasic   diameter changes were blunted (< 50%), consistent with elevated   central venous pressure.  Impressions:  -  Technically difficult study. Compared to a prior study in 08/2015,   the LVEF is unchanged at 60-65%. There is now grade 1 DD with   indetermiante LV filling pressure.  Antimicrobials: Zithromax from 07/21/2017  onwards   Subjective: Patient seen and examined at bedside.  Patient feels only slightly better, still short of breath and coughing.  No chest pain  No overnight fever or vomiting.  Objective: Vitals:   07/25/17 0721 07/25/17 0723 07/25/17 0810 07/25/17 0910  BP:  131/77 118/84   Pulse:  83 91   Resp:  18 (!) 22   Temp:  97.8 F (36.6 C)    TempSrc:  Axillary    SpO2: 95% 94% 98% (!) 85%  Weight:      Height:        Intake/Output Summary (Last 24 hours) at 07/25/2017 1152 Last data filed at 07/25/2017 0900 Gross per 24 hour  Intake 1917.99 ml  Output 1198 ml  Net 719.99 ml   Filed Weights   07/22/17 0500 07/23/17 0452 07/24/17 0500  Weight: 113.6 kg (250 lb 7.1 oz) 113.2 kg (249 lb 9 oz) 112.7 kg (248 lb 7.3 oz)    Examination:  General exam: Not in distress. Awake and alert. Poor historian. Respiratory system: Air entry is improving. Expiratory wheeze is improving. Cardiovascular system: S1 & S2.  Gastrointestinal system: Abdomen is soft and non tender. Organs are not palpable.  Extremities: No leg edema  CNS: AAO X 3. Patient moves all limbs.  Data Reviewed: I have personally reviewed following labs and imaging studies  CBC: Recent Labs  Lab 07/21/17 0908 07/22/17 0412 07/23/17 0514 07/24/17 0414 07/25/17 0500  WBC 10.3 8.5 14.2* 14.2* 14.4*  NEUTROABS  --   --  12.4* 12.1*  --   HGB 15.5 14.7 14.8 14.8 15.0  HCT 50.2 48.2 49.6 49.2 49.4  MCV 94.0 95.3 96.3 96.7 95.0  PLT 202 217 208 209 195   Basic Metabolic Panel: Recent Labs  Lab 07/21/17 0908 07/22/17 0412 07/23/17 0514 07/24/17 0414 07/25/17 0500  NA 135 134* 133* 137 138  K 4.2 4.7 5.1 4.8 4.3  CL 98* 95* 93* 93* 93*  CO2 30 33* 33* 34* 36*  GLUCOSE 187* 239* 250* 262* 300*  BUN <5* 11 21* 22* 29*  CREATININE 0.79 0.74 0.86 0.89 0.89  CALCIUM 9.0 8.7* 8.6* 9.0 8.9  MG  --   --  2.5* 2.7*  --    GFR: Estimated Creatinine Clearance: 121.5 mL/min (by C-G formula based on SCr of 0.89  mg/dL). Liver Function Tests: Recent Labs  Lab 07/22/17 0412  AST 14*  ALT 22  ALKPHOS 47  BILITOT 0.7  PROT 8.7*  ALBUMIN 3.7   No results for input(s): LIPASE, AMYLASE in the last 168 hours. No results for input(s): AMMONIA in the last 168 hours. Coagulation Profile: No results for input(s): INR, PROTIME in the last 168 hours. Cardiac Enzymes: Recent Labs  Lab 07/21/17 0908  TROPONINI <0.03   BNP (last 3 results) No results for input(s): PROBNP in the last 8760 hours. HbA1C: No results for input(s): HGBA1C in the last 72 hours. CBG: Recent Labs  Lab 07/23/17 2100 07/24/17 0731 07/24/17 1233 07/24/17 1626 07/24/17 1954  GLUCAP 304* 269* 353* 478* 398*   Lipid Profile: No results for input(s): CHOL, HDL, LDLCALC, TRIG, CHOLHDL, LDLDIRECT in the last 72 hours. Thyroid Function Tests: Recent Labs    07/25/17 0500  TSH 0.487   Anemia Panel: No results  for input(s): VITAMINB12, FOLATE, FERRITIN, TIBC, IRON, RETICCTPCT in the last 72 hours. Sepsis Labs: Recent Labs  Lab 07/24/17 1806  PROCALCITON 0.12    Recent Results (from the past 240 hour(s))  MRSA PCR Screening     Status: None   Collection Time: 07/21/17  6:31 PM  Result Value Ref Range Status   MRSA by PCR NEGATIVE NEGATIVE Final    Comment:        The GeneXpert MRSA Assay (FDA approved for NASAL specimens only), is one component of a comprehensive MRSA colonization surveillance program. It is not intended to diagnose MRSA infection nor to guide or monitor treatment for MRSA infections.          Radiology Studies: No results found.      Scheduled Meds: . atorvastatin  20 mg Oral Daily  . azithromycin  500 mg Oral Daily  . bictegravir-emtricitabine-tenofovir AF  1 tablet Oral Q1500  . furosemide  40 mg Intravenous BID  . insulin aspart  0-20 Units Subcutaneous TID WC  . insulin aspart  0-5 Units Subcutaneous QHS  . insulin aspart  5 Units Subcutaneous TID WC  . insulin  glargine  25 Units Subcutaneous Daily  . ipratropium-albuterol  3 mL Nebulization Q6H  . methylPREDNISolone (SOLU-MEDROL) injection  60 mg Intravenous Q8H  . mometasone-formoterol  2 puff Inhalation BID  . pantoprazole  40 mg Oral BID AC  . tiotropium  18 mcg Inhalation Daily   Continuous Infusions: . diltiazem (CARDIZEM) infusion 20 mg/hr (07/25/17 1014)  . heparin 1,600 Units/hr (07/25/17 0319)     LOS: 3 days        Barnetta Chapel, MD [facility] Pager [phone]  If 7PM-7AM, please contact night-coverage [email or web address] Password [email or web address] 07/25/2017, 11:52 AM

## 2017-07-25 NOTE — Progress Notes (Signed)
ANTICOAGULATION CONSULT NOTE - Follow Up Consult  Pharmacy Consult for heparin Indication: atrial fibrillation  Labs: Recent Labs    07/22/17 0412 07/23/17 0514 07/24/17 0414 07/24/17 2225  HGB 14.7 14.8 14.8  --   HCT 48.2 49.6 49.2  --   PLT 217 208 209  --   HEPARINUNFRC  --   --   --  0.31  CREATININE 0.74 0.86 0.89  --     Assessment: 51yo male therapeutic on heparin with initial dosing for Afib though at very low end of goal.  Goal of Therapy:  Heparin level 0.3-0.7 units/ml   Plan:  Will increase heparin gtt slightly to 1600 units/hr to prevent dropping below goal and check level with am labs.  Vernard GamblesVeronda Amar Sippel, PharmD, BCPS  07/25/2017,12:10 AM

## 2017-07-25 NOTE — Progress Notes (Signed)
Pt. CBG out of sliding scale rage with a CBG of 410 @ 1603. Dr. Dartha Lodgegbata notified. Verbal orders to give pt. 20 units of Novolog. Will continue to monitor.

## 2017-07-25 NOTE — Plan of Care (Signed)
Discussed importance of wearing blood pressure cuff due to being on a heparin and cardizem gtt's with some teach back displayed

## 2017-07-25 NOTE — Progress Notes (Signed)
ANTICOAGULATION CONSULT NOTE - Follow up  Pharmacy Consult for heparin  Indication: atrial fibrillation-new onset  Allergies  Allergen Reactions  . Bactrim [Sulfamethoxazole-Trimethoprim] Hives    Patient Measurements: Heparin Dosing Weight: 96 kg  Vital Signs: Temp: 97.8 F (36.6 C) (12/15 0723) Temp Source: Axillary (12/15 0723) BP: 118/84 (12/15 0810) Pulse Rate: 91 (12/15 0810)  Labs: Recent Labs    07/23/17 0514 07/24/17 0414 07/24/17 2225 07/25/17 0500  HGB 14.8 14.8  --  15.0  HCT 49.6 49.2  --  49.4  PLT 208 209  --  195  HEPARINUNFRC  --   --  0.31 0.39  CREATININE 0.86 0.89  --  0.89    Estimated Creatinine Clearance: 121.5 mL/min (by C-G formula based on SCr of 0.89 mg/dL).   Medical History: Past Medical History:  Diagnosis Date  . COPD (chronic obstructive pulmonary disease) (HCC)   . Depression   . Diabetes mellitus without complication (HCC)   . Dyspnea   . Emphysema   . GERD (gastroesophageal reflux disease)   . HIV disease [facility](HCC)     Assessment: 51 yo male started on IV heparin infusion on 07/24/17 for new onset atrial fibrillation with RVR. No known a/c PTA. CHADS-VASc 3. Heparin level = 0.39, therapeutic x2, currently on heparin 1600 units/hr CBC wnl stable.  No bleeding noted   Goal of Therapy:  Heparin level 0.3-0.7 units/ml Monitor platelets by anticoagulation protocol: Yes   Plan: Continue Heparin infusion at 1600 units/hr  Daily HL, CBC   Noah Delaineuth Zebediah Beezley, RPh Clinical Pharmacist [phone] After 4PM call Main Pharmacy [phone]  07/25/2017, 12:00 PM

## 2017-07-25 NOTE — Progress Notes (Signed)
RT entered room to give patient his inhaler as he had his bipap off. Patient had removed bipap to eat and take a break. Patient took his inhaler and then RT placed patient back on bipap as his sats were in the 80's. Vitals are stable and sats are now 94%. RT will continue to monitor.

## 2017-07-26 LAB — CBC
HCT: 48.1 % (ref 39.0–52.0)
Hemoglobin: 14.7 g/dL (ref 13.0–17.0)
MCH: 28.9 pg (ref 26.0–34.0)
MCHC: 30.6 g/dL (ref 30.0–36.0)
MCV: 94.5 fL (ref 78.0–100.0)
Platelets: 187 10*3/uL (ref 150–400)
RBC: 5.09 MIL/uL (ref 4.22–5.81)
RDW: 12.3 % (ref 11.5–15.5)
WBC: 13.2 10*3/uL — ABNORMAL HIGH (ref 4.0–10.5)

## 2017-07-26 LAB — GLUCOSE, CAPILLARY
Glucose-Capillary: 324 mg/dL — ABNORMAL HIGH (ref 65–99)
Glucose-Capillary: 370 mg/dL — ABNORMAL HIGH (ref 65–99)
Glucose-Capillary: 332 mg/dL — ABNORMAL HIGH (ref 65–99)
Glucose-Capillary: 382 mg/dL — ABNORMAL HIGH (ref 65–99)
Glucose-Capillary: 490 mg/dL — ABNORMAL HIGH (ref 65–99)

## 2017-07-26 LAB — HEPARIN LEVEL (UNFRACTIONATED): Heparin Unfractionated: 0.47 IU/mL (ref 0.30–0.70)

## 2017-07-26 MED ORDER — METHYLPREDNISOLONE SODIUM SUCC 125 MG IJ SOLR
60.0000 mg | Freq: Two times a day (BID) | INTRAMUSCULAR | Status: DC
  Administered 2017-07-26 – 2017-07-27 (×2): 60 mg via INTRAVENOUS
  Filled 2017-07-26 (×2): qty 2

## 2017-07-26 MED ORDER — DIGOXIN 125 MCG PO TABS
0.1250 mg | ORAL_TABLET | Freq: Every day | ORAL | Status: DC
  Administered 2017-07-28: 0.125 mg via ORAL
  Filled 2017-07-26: qty 1

## 2017-07-26 MED ORDER — INSULIN GLARGINE 100 UNIT/ML ~~LOC~~ SOLN
30.0000 [IU] | Freq: Two times a day (BID) | SUBCUTANEOUS | Status: DC
  Administered 2017-07-26 – 2017-07-28 (×4): 30 [IU] via SUBCUTANEOUS
  Filled 2017-07-26 (×6): qty 0.3

## 2017-07-26 MED ORDER — INSULIN GLARGINE 100 UNIT/ML ~~LOC~~ SOLN
10.0000 [IU] | Freq: Once | SUBCUTANEOUS | Status: AC
  Administered 2017-07-26: 10 [IU] via SUBCUTANEOUS
  Filled 2017-07-26: qty 0.1

## 2017-07-26 MED ORDER — DIGOXIN 0.25 MG/ML IJ SOLN
0.2500 mg | Freq: Three times a day (TID) | INTRAMUSCULAR | Status: AC
  Administered 2017-07-26 – 2017-07-27 (×3): 0.25 mg via INTRAVENOUS
  Filled 2017-07-26 (×3): qty 2

## 2017-07-26 NOTE — Progress Notes (Signed)
Patient ID: Chris [name]., male   DOB: 06-08-1966, 51 y.o.   MRN: [id number]  PROGRESS NOTE    [name] Aron [name].  NWG:[id number] DOB: March 07, 1966 DOA: 07/21/2017 PCP: Quentin Angst, MD   Brief Narrative:  50 year old male with history of diabetes, GERD, HIV, tobacco use, COPD on oxygen only as needed at home presented with shortness of breath.  He was admitted with COPD exacerbation started on intravenous steroids.  Patient was put on intravenous Cardizem drip overnight for new onset atrial fibrillation with rapid ventricular rate.  07/26/17 - Patient seen. Patient continues to improve but still on Cardizem drip. Discussed with the Cardiology team and Digoxin will be added, IV for the first 24 hours and then changed to oral. On intermittent BiPAP. Patient respiratory symptoms have continued to improve. Cardiology is managing the atrial fibrillation. Blood sugar needs to be optimized. Will decrease IV solumedrol to 60mg  Bid, and increase [name] Lantus to 30Units Bid. Counseled to quit cigarettes.    Assessment & Plan:   Active Problems:   HIV disease (HCC)   COPD  GOLD III, still smoking   Steroid-induced hyperglycemia   Chronic respiratory failure with hypoxia and hypercapnia (HCC)   DM (diabetes mellitus) (HCC)   Obstructive sleep apnea syndrome   Essential hypertension, benign   Atopic eczema   Type 2 diabetes mellitus with complication (HCC)   Morbid (severe) obesity due to excess calories (HCC)   COPD with acute exacerbation (HCC)   Depression   GERD (gastroesophageal reflux disease)   Acute on chronic respiratory failure with hypoxia (HCC)   Acute hypoxic respiratory failure - continue neb treatment and PRN BiPAP. - Continue Steroids (see above).  COPD with exacerbation - See above. -Continue current dosing of supplemental as patient is still significantly hypoxic.  Continue Dulera.  Continue duo nebs -CT of the chest is negative for PE or acute findings.  New onset  atrial fibrillation with rapid ventricular rate - Currently on Cardizem and heparin drip.  Cardiology consulted - Add Digoxin  Leukocytosis - ?Role of steroids. - Continue to monitor.  Tobacco abuse -Counseled about cessation  Diabetes mellitus type 2 -Hold oral meds.  Increase [place] Lantus to 30 Units bid. - Optimize. Uncontrolled.  Hyperlipidemia -Continue statins  Hypertension -Monitor.  Not on oral antihypertensives at home  HIV -Continue current home antiretroviral therapy.  Outpatient follow-up with primary care provider  Obstructive sleep apnea -Continue CPAP at night   GERD - continue PPI  DVT prophylaxis: Lovenox Code Status:  Full Family Communication: None at bedside Disposition Plan: Home in 2-3 days  Consultants: Cardiology  Procedures: Echo on 07/22/2017 Study Conclusions  - Procedure narrative: Transthoracic echocardiography. Image   quality was poor. The study was technically difficult, as a   result of poor acoustic windows, poor sound wave transmission,   and body habitus. - Left ventricle: The cavity size was normal. Wall thickness was   increased in a pattern of moderate LVH. Systolic function was   normal. The estimated ejection fraction was in the range of 60%   to 65%. Wall motion was normal; there were no regional wall   motion abnormalities. Doppler parameters are consistent with   abnormal left ventricular relaxation (grade 1 diastolic   dysfunction). The E/e&' ratio is between 8-15, suggesting   indeterminate LV filling pressure. - Mitral valve: Mildly thickened leaflets . There was mild   regurgitation. - Left atrium: The atrium was normal in size. - Inferior vena cava:  The vessel was dilated. The respirophasic   diameter changes were blunted (< 50%), consistent with elevated   central venous pressure.  Impressions:  - Technically difficult study. Compared to a prior study in 08/2015,   the LVEF is unchanged at 60-65%. There is  now grade 1 DD with   indetermiante LV filling pressure.  Antimicrobials: Zithromax from 07/21/2017 onwards   Subjective: Patient seen and examined at bedside.  Patient feels only slightly better, still short of breath and coughing.  No chest pain  No overnight fever or vomiting.  Objective: Vitals:   07/26/17 0852 07/26/17 0854 07/26/17 0855 07/26/17 1100  BP:      Pulse:   (!) 158   Resp:   20   Temp:      TempSrc:      SpO2: (!) 89% 91% 95%   Weight:      Height:    5\' 9"  (1.753 m)    Intake/Output Summary (Last 24 hours) at 07/26/2017 1323 Last data filed at 07/26/2017 0800 Gross per 24 hour  Intake 968 ml  Output 3000 ml  Net -2032 ml   Filed Weights   07/23/17 0452 07/24/17 0500 07/26/17 0254  Weight: 113.2 kg (249 lb 9 oz) 112.7 kg (248 lb 7.3 oz) 113.6 kg (250 lb 7.1 oz)    Examination:  General exam: Not in distress. Awake and alert. Poor historian. Respiratory system: Air entry is improving. Expiratory wheeze is improving. Cardiovascular system: S1 & S2.  Gastrointestinal system: Abdomen is soft and non tender. Organs are not palpable.  Extremities: No leg edema  CNS: AAO X 3. Patient moves all limbs.  Data Reviewed: I have personally reviewed following labs and imaging studies  CBC: Recent Labs  Lab 07/22/17 0412 07/23/17 0514 07/24/17 0414 07/25/17 0500 07/26/17 0434  WBC 8.5 14.2* 14.2* 14.4* 13.2*  NEUTROABS  --  12.4* 12.1*  --   --   HGB 14.7 14.8 14.8 15.0 14.7  HCT 48.2 49.6 49.2 49.4 48.1  MCV 95.3 96.3 96.7 95.0 94.5  PLT 217 208 209 195 187   Basic Metabolic Panel: Recent Labs  Lab 07/21/17 0908 07/22/17 0412 07/23/17 0514 07/24/17 0414 07/25/17 0500 07/25/17 1618  NA 135 134* 133* 137 138  --   K 4.2 4.7 5.1 4.8 4.3  --   CL 98* 95* 93* 93* 93*  --   CO2 30 33* 33* 34* 36*  --   GLUCOSE 187* 239* 250* 262* 300* 410*  BUN <5* 11 21* 22* 29*  --   CREATININE 0.79 0.74 0.86 0.89 0.89  --   CALCIUM 9.0 8.7* 8.6* 9.0 8.9  --    MG  --   --  2.5* 2.7*  --   --    GFR: Estimated Creatinine Clearance: 122.1 mL/min (by C-G formula based on SCr of 0.89 mg/dL). Liver Function Tests: Recent Labs  Lab 07/22/17 0412  AST 14*  ALT 22  ALKPHOS 47  BILITOT 0.7  PROT 8.7*  ALBUMIN 3.7   No results for input(s): LIPASE, AMYLASE in the last 168 hours. No results for input(s): AMMONIA in the last 168 hours. Coagulation Profile: No results for input(s): INR, PROTIME in the last 168 hours. Cardiac Enzymes: Recent Labs  Lab 07/21/17 0908  TROPONINI <0.03   BNP (last 3 results) No results for input(s): PROBNP in the last 8760 hours. HbA1C: No results for input(s): HGBA1C in the last 72 hours. CBG: Recent Labs  Lab 07/25/17 1254  07/25/17 1603 07/25/17 2101 07/26/17 0743 07/26/17 1204  GLUCAP 372* 410* 385* 324* 370*   Lipid Profile: No results for input(s): CHOL, HDL, LDLCALC, TRIG, CHOLHDL, LDLDIRECT in the last 72 hours. Thyroid Function Tests: Recent Labs    07/25/17 0500  TSH 0.487   Anemia Panel: No results for input(s): VITAMINB12, FOLATE, FERRITIN, TIBC, IRON, RETICCTPCT in the last 72 hours. Sepsis Labs: Recent Labs  Lab 07/24/17 1806  PROCALCITON 0.12    Recent Results (from the past 240 hour(s))  MRSA PCR Screening     Status: None   Collection Time: 07/21/17  6:31 PM  Result Value Ref Range Status   MRSA by PCR NEGATIVE NEGATIVE Final    Comment:        The GeneXpert MRSA Assay (FDA approved for NASAL specimens only), is one component of a comprehensive MRSA colonization surveillance program. It is not intended to diagnose MRSA infection nor to guide or monitor treatment for MRSA infections.          Radiology Studies: No results found.      Scheduled Meds: . atorvastatin  20 mg Oral Daily  . azithromycin  500 mg Oral Daily  . bictegravir-emtricitabine-tenofovir AF  1 tablet Oral Q1500  . digoxin  0.25 mg Intravenous Q8H  . [START ON 07/28/2017] digoxin   0.125 mg Oral Daily  . furosemide  40 mg Intravenous BID  . insulin aspart  0-20 Units Subcutaneous TID WC  . insulin aspart  0-5 Units Subcutaneous QHS  . insulin aspart  5 Units Subcutaneous TID WC  . insulin glargine  30 Units Subcutaneous BID  . ipratropium-albuterol  3 mL Nebulization Q6H  . methylPREDNISolone (SOLU-MEDROL) injection  60 mg Intravenous Q12H  . mometasone-formoterol  2 puff Inhalation BID  . pantoprazole  40 mg Oral BID AC  . tiotropium  18 mcg Inhalation Daily   Continuous Infusions: . diltiazem (CARDIZEM) infusion 20 mg/hr (07/26/17 1217)  . heparin 1,600 Units/hr (07/26/17 1222)     LOS: 4 days        Barnetta ChapelSylvester I Kaleigh Spiegelman, MD [facility] Pager [phone]  If 7PM-7AM, please contact night-coverage [email or web address] Password [email or web address] 07/26/2017, 1:23 PM

## 2017-07-26 NOTE — Plan of Care (Signed)
Discussed with patient plan of care, pain management, CPAP/BIPAP tonight and re-stick of blood sugar from 490 to recheck of 382.  Will continue to monitor with some teach back displayed.  Wife had brought some home cooked food and patient receiving steroid at this time.

## 2017-07-26 NOTE — Progress Notes (Signed)
ANTICOAGULATION CONSULT NOTE - follow up  Pharmacy Consult for  Heparin  Indication:  New onset Atrial fibrillation  Allergies  Allergen Reactions  . Bactrim [Sulfamethoxazole-Trimethoprim] Hives    Patient Measurements: Height: '5\' 9"'  (175.3 cm)(from 07/21/17 @ 6:36 PM) Weight: 250 lb 7.1 oz (113.6 kg) IBW/kg (Calculated) : 70.7 Heparin Dosing Weight: 96 kg  Vital Signs: Temp: 98 F (36.7 C) (12/16 0800) Temp Source: Oral (12/16 0800) BP: 135/71 (12/16 0800) Pulse Rate: 158 (12/16 0855)  Labs: Recent Labs    07/24/17 0414 07/24/17 2225 07/25/17 0500 07/26/17 0434  HGB 14.8  --  15.0 14.7  HCT 49.2  --  49.4 48.1  PLT 209  --  195 187  HEPARINUNFRC  --  0.31 0.39 0.47  CREATININE 0.89  --  0.89  --     Estimated Creatinine Clearance: 122.1 mL/min (by C-G formula based on SCr of 0.89 mg/dL).   Medical History: Past Medical History:  Diagnosis Date  . COPD (chronic obstructive pulmonary disease) ([place])   . Depression   . Diabetes mellitus without complication ([place])   . Dyspnea   . Emphysema   . GERD (gastroesophageal reflux disease)   . HIV disease ([place])     Medications:  Medications Prior to Admission  Medication Sig Dispense Refill Last Dose  . ACCU-CHEK SOFTCLIX LANCETS lancets Use as instructed 100 each 12 07/20/2017 at Unknown time  . acetaminophen-codeine (TYLENOL #3) 300-30 MG tablet Take 1 tablet by mouth every 8 (eight) hours as needed for moderate pain. 90 tablet 0 07/20/2017 at Unknown time  . albuterol (PROVENTIL HFA;VENTOLIN HFA) 108 (90 Base) MCG/ACT inhaler Inhale 2 puffs into the lungs every 4 (four) hours as needed for wheezing or shortness of breath (((PLAN B))). 1 Inhaler 2 07/20/2017 at Unknown time  . atorvastatin (LIPITOR) 20 MG tablet Take 1 tablet (20 mg total) by mouth daily. 90 tablet 3 07/20/2017 at Unknown time  . bictegravir-emtricitabine-tenofovir AF (BIKTARVY) 50-200-25 MG TABS tablet Take 1 tablet by mouth daily. (Patient taking  differently: Take 1 tablet by mouth daily at 3 pm. ) 30 tablet 6 07/20/2017 at Unknown time  . Blood Glucose Monitoring Suppl (ACCU-CHEK AVIVA PLUS) W/DEVICE KIT 250 Test up to 4 times daily 1 kit 0 07/20/2017 at Unknown time  . budesonide-formoterol (SYMBICORT) 160-4.5 MCG/ACT inhaler Take 2 puffs first thing in am and then another 2 puffs about 12 hours later. (Patient taking differently: Inhale 2 puffs into the lungs every 12 (twelve) hours. ) 1 Inhaler 0 07/20/2017 at Unknown time  . clotrimazole (LOTRIMIN) 1 % cream Apply 1 application topically 2 (two) times daily. 30 g 0 07/20/2017 at Unknown time  . EZ SMART BLOOD GLUCOSE LANCETS MISC 1 Units by Does not apply route 4 (four) times daily. 100 each 11 07/20/2017 at Unknown time  . glucose blood test strip Use as instructed 100 each 12 07/20/2017 at Unknown time  . insulin glargine (LANTUS) 100 UNIT/ML injection Inject 0.25 mLs (25 Units total) into the skin every morning. (Patient taking differently: Inject 25 Units into the skin at bedtime. ) 10 mL 3 07/20/2017 at Unknown time  . Insulin Pen Needle 32G X 5 MM MISC Use as directed 100 each 11 07/20/2017 at Unknown time  . Insulin Syringe-Needle U-100 (BD INSULIN SYRINGE ULTRAFINE) 31G X 15/64" 0.5 ML MISC Use as directed 30 each 5 07/20/2017 at Unknown time  . ipratropium-albuterol (DUONEB) 0.5-2.5 (3) MG/3ML SOLN Take 3 mLs by nebulization every 6 (six) hours  as needed. (Patient taking differently: Take 3 mLs by nebulization every 6 (six) hours as needed (wheezing/shortness of breath). ) 360 mL 3 07/20/2017 at Unknown time  . metFORMIN (GLUCOPHAGE) 1000 MG tablet Take 1 tablet (1,000 mg total) by mouth 2 (two) times daily with a meal. 180 tablet 3 07/20/2017 at Unknown time  . OXYGEN Place 2 L into the nose as needed (shortness of breath).    07/21/2017 at Unknown time  . pantoprazole (PROTONIX) 40 MG tablet Take 1 tablet (40 mg total) by mouth 2 (two) times daily before a meal. 90 tablet 3  07/20/2017 at Unknown time  . Tiotropium Bromide Monohydrate (SPIRIVA RESPIMAT) 2.5 MCG/ACT AERS Inhale 2 puffs into the lungs daily. 1 Inhaler 3 07/20/2017 at Unknown time  . urea (CARMOL) 20 % cream Apply as needed topically. 85 g 1 07/20/2017 at Unknown time  . levofloxacin (LEVAQUIN) 500 MG tablet Take 1 tablet (500 mg total) by mouth daily. (Patient not taking: Reported on 07/21/2017) 7 tablet 0 Not Taking at Unknown time  . predniSONE (DELTASONE) 50 MG tablet Take 1 tablet daily with breakfast (Patient not taking: Reported on 07/21/2017) 5 tablet 0 Not Taking at Unknown time  . sildenafil (VIAGRA) 50 MG tablet Take 0.5 tablets (25 mg total) by mouth daily as needed for erectile dysfunction. 30 tablet 5 unk   Scheduled:  . atorvastatin  20 mg Oral Daily  . azithromycin  500 mg Oral Daily  . bictegravir-emtricitabine-tenofovir AF  1 tablet Oral Q1500  . furosemide  40 mg Intravenous BID  . insulin aspart  0-20 Units Subcutaneous TID WC  . insulin aspart  0-5 Units Subcutaneous QHS  . insulin aspart  5 Units Subcutaneous TID WC  . insulin glargine  10 Units Subcutaneous Once  . insulin glargine  30 Units Subcutaneous BID  . ipratropium-albuterol  3 mL Nebulization Q6H  . methylPREDNISolone (SOLU-MEDROL) injection  60 mg Intravenous Q12H  . mometasone-formoterol  2 puff Inhalation BID  . pantoprazole  40 mg Oral BID AC  . tiotropium  18 mcg Inhalation Daily    Assessment: 51 yo male started on IV heparin infusion on 07/24/17 for new onset atrial fibrillation with RVR. No known a/c PTA. CHADS-VASc 3. Heparin level = 0.47 remains therapeutic on heparin 1600 units/hr CBC wnl stable.  No bleeding noted per RN's report.  Goal of Therapy:  Heparin level 0.3-0.7 units/ml Monitor platelets by anticoagulation protocol: Yes   Plan:  Continue Heparin infusion at 1600 units/hr  Daily Heparin level, CBC Follow up on plan for oral anticoagulation   Nicole Cella, RPh Clinical  Pharmacist 8A-4P [phone] After 4PM call [place] [phone] 07/26/2017,11:29 AM

## 2017-07-27 DIAGNOSIS — I481 Persistent atrial fibrillation: Secondary | ICD-10-CM

## 2017-07-27 DIAGNOSIS — J449 Chronic obstructive pulmonary disease, unspecified: Secondary | ICD-10-CM

## 2017-07-27 LAB — HEPARIN LEVEL (UNFRACTIONATED): Heparin Unfractionated: 0.79 IU/mL — ABNORMAL HIGH (ref 0.30–0.70)

## 2017-07-27 LAB — GLUCOSE, CAPILLARY
GLUCOSE-CAPILLARY: 273 mg/dL — AB (ref 65–99)
GLUCOSE-CAPILLARY: 321 mg/dL — AB (ref 65–99)
GLUCOSE-CAPILLARY: 307 mg/dL — AB (ref 65–99)

## 2017-07-27 LAB — CBC
HCT: 46.1 % (ref 39.0–52.0)
Hemoglobin: 14.4 g/dL (ref 13.0–17.0)
MCH: 28.8 pg (ref 26.0–34.0)
MCHC: 31.2 g/dL (ref 30.0–36.0)
MCV: 92.2 fL (ref 78.0–100.0)
Platelets: 185 10*3/uL (ref 150–400)
RBC: 5 MIL/uL (ref 4.22–5.81)
RDW: 12.3 % (ref 11.5–15.5)
WBC: 12.6 10*3/uL — ABNORMAL HIGH (ref 4.0–10.5)

## 2017-07-27 MED ORDER — FUROSEMIDE 40 MG PO TABS
40.0000 mg | ORAL_TABLET | Freq: Every day | ORAL | Status: DC
  Administered 2017-07-28: 40 mg via ORAL
  Filled 2017-07-27: qty 1

## 2017-07-27 MED ORDER — APIXABAN 5 MG PO TABS
5.0000 mg | ORAL_TABLET | Freq: Two times a day (BID) | ORAL | Status: DC
  Administered 2017-07-27 – 2017-07-28 (×3): 5 mg via ORAL
  Filled 2017-07-27 (×3): qty 1

## 2017-07-27 MED ORDER — PREDNISONE 20 MG PO TABS
40.0000 mg | ORAL_TABLET | Freq: Every day | ORAL | Status: DC
  Administered 2017-07-28: 40 mg via ORAL
  Filled 2017-07-27: qty 2

## 2017-07-27 MED ORDER — DILTIAZEM HCL 60 MG PO TABS
90.0000 mg | ORAL_TABLET | Freq: Four times a day (QID) | ORAL | Status: DC
  Administered 2017-07-27 – 2017-07-28 (×4): 90 mg via ORAL
  Filled 2017-07-27 (×4): qty 1

## 2017-07-27 MED ORDER — GUAIFENESIN ER 600 MG PO TB12: 1200.0000 mg | ORAL_TABLET | Freq: Two times a day (BID) | ORAL | Status: DC | PRN

## 2017-07-27 NOTE — Progress Notes (Signed)
Progress Note  Patient Name: Chris RiversJerome A Fortenberry Jr. Date of Encounter: 07/27/2017  Primary Cardiologist: Dr. Delton SeeNelson  Subjective   Pt is breathing better. He wants to transfer to a regular floor.  Inpatient Medications    Scheduled Meds: . atorvastatin  20 mg Oral Daily  . azithromycin  500 mg Oral Daily  . bictegravir-emtricitabine-tenofovir AF  1 tablet Oral Q1500  . [START ON 07/28/2017] digoxin  0.125 mg Oral Daily  . furosemide  40 mg Intravenous BID  . insulin aspart  0-20 Units Subcutaneous TID WC  . insulin aspart  0-5 Units Subcutaneous QHS  . insulin aspart  5 Units Subcutaneous TID WC  . insulin glargine  30 Units Subcutaneous BID  . ipratropium-albuterol  3 mL Nebulization Q6H  . methylPREDNISolone (SOLU-MEDROL) injection  60 mg Intravenous Q12H  . mometasone-formoterol  2 puff Inhalation BID  . pantoprazole  40 mg Oral BID AC  . tiotropium  18 mcg Inhalation Daily   Continuous Infusions: . diltiazem (CARDIZEM) infusion 10 mg/hr (07/27/17 0314)  . heparin 1,500 Units/hr (07/27/17 0636)   PRN Meds: acetaminophen **OR** acetaminophen, albuterol, bisacodyl, guaiFENesin, HYDROcodone-acetaminophen, metoprolol tartrate, nicotine polacrilex, ondansetron **OR** ondansetron (ZOFRAN) IV, senna-docusate, sodium chloride   Vital Signs    Vitals:   07/27/17 0500 07/27/17 0600 07/27/17 0700 07/27/17 0715  BP: 133/88 132/73 136/86 123/89  Pulse: 86 81 78 81  Resp: 20 17 19 19   Temp:    97.6 F (36.4 C)  TempSrc:    Axillary  SpO2: 90% 91% 93% 96%  Weight:      Height:        Intake/Output Summary (Last 24 hours) at 07/27/2017 0752 Last data filed at 07/27/2017 0636 Gross per 24 hour  Intake 1042.77 ml  Output 4600 ml  Net -3557.23 ml   Filed Weights   07/24/17 0500 07/26/17 0254 07/27/17 0132  Weight: 248 lb 7.3 oz (112.7 kg) 250 lb 7.1 oz (113.6 kg) 250 lb 10.6 oz (113.7 kg)     Physical Exam   General: Well developed, well nourished, male appearing in no  acute distress. Head: Normocephalic, atraumatic.  Neck: Supple without bruits, JVD. Lungs:  Resp regular and unlabored, CTA. Heart: irregular rhythm, regular rate, no murmur; no rub. Abdomen: Soft, non-tender, non-distended with normoactive bowel sounds. No hepatomegaly. No rebound/guarding. No obvious abdominal masses. Extremities: No clubbing, cyanosis, trace edema. Distal pedal pulses are 2+ bilaterally. Neuro: Alert and oriented X 3. Moves all extremities spontaneously. Psych: Normal affect.  Labs    Chemistry Recent Labs  Lab 07/22/17 [phone] 07/23/17 0514 07/24/17 0414 07/25/17 0500 07/25/17 1618  NA 134* 133* 137 138  --   K 4.7 5.1 4.8 4.3  --   CL 95* 93* 93* 93*  --   CO2 33* 33* 34* 36*  --   GLUCOSE 239* 250* 262* 300* 410*  BUN 11 21* 22* 29*  --   CREATININE 0.74 0.86 0.89 0.89  --   CALCIUM 8.7* 8.6* 9.0 8.9  --   PROT 8.7*  --   --   --   --   ALBUMIN 3.7  --   --   --   --   AST 14*  --   --   --   --   ALT 22  --   --   --   --   ALKPHOS 47  --   --   --   --   BILITOT 0.7  --   --   --   --  GFRNONAA >60 >60 >60 >60  --   GFRAA >60 >60 >60 >60  --   ANIONGAP 6 7 10 9   --      Hematology Recent Labs  Lab 07/25/17 0500 07/26/17 0434 07/27/17 0426  WBC 14.4* 13.2* 12.6*  RBC 5.20 5.09 5.00  HGB 15.0 14.7 14.4  HCT 49.4 48.1 46.1  MCV 95.0 94.5 92.2  MCH 28.8 28.9 28.8  MCHC 30.4 30.6 31.2  RDW 12.3 12.3 12.3  PLT 195 187 185    Cardiac Enzymes Recent Labs  Lab 07/21/17 0908  TROPONINI <0.03   No results for input(s): TROPIPOC in the last 168 hours.   BNP Recent Labs  Lab 07/21/17 0910  BNP 30.1     DDimer No results for input(s): DDIMER in the last 168 hours.   Radiology    No results found.   Telemetry    Afib in the 90s - Personally Reviewed  ECG    No new tracings - Personally Reviewed   Cardiac Studies   2d echo 07/22/17 Study Conclusions  - Procedure narrative: Transthoracic echocardiography.  Image quality was poor. The study was technically difficult, as a result of poor acoustic windows, poor sound wave transmission, and body habitus. - Left ventricle: The cavity size was normal. Wall thickness was increased in a pattern of moderate LVH. Systolic function was normal. The estimated ejection fraction was in the range of 60% to 65%. Wall motion was normal; there were no regional wall motion abnormalities. Doppler parameters are consistent with abnormal left ventricular relaxation (grade 1 diastolic dysfunction). The E/e&' ratio is between 8-15, suggesting indeterminate LV filling pressure. - Mitral valve: Mildly thickened leaflets . There was mild regurgitation. - Left atrium: The atrium was normal in size. - Inferior vena cava: The vessel was dilated. The respirophasic diameter changes were blunted (<50%), consistent with elevated central venous pressure.  Impressions:  - Technically difficult study. Compared to a prior study in 08/2015, the LVEF is unchanged at 60-65%. There is now grade 1 DD with indetermiante LV filling pressure.  Patient Profile     51 y.o. male with HIV, GERD, COPD with chronic respiratory failure on home O2 with ongoing tobacco abuse, DM, depression who was admitted on 07/21/17 for acute exacerbation of COPD, found to have new onset atrial fibrillation. CTA neg for PE, does not specifically comment on coronary calcification, BNP 30 and troponin neg. Started digoxin over the weekend, able to decrease dilt drip  Assessment & Plan    1. New onset atrial fibrillation - diltiazem drip had been 20 mg/hr - over the weekend, primary started digoxin: he has received three doses of 0.25 mg, has started 0.125 mg this evening - diltiazem now running at 10 mg/hr - much better rate-controlled, pt is in Afib in the 90s - avoiding beta blockers given his respiratory status - pt is improving from a respiratory standpoint - only  required CPAP and no meds overnight - rate control may be multi-factorial - improving respiratory status + addition of digoxin - will likely continue to improve as his respiratory status improves - OK to move to stepdown floor - This patients CHA2DS2-VASc Score and unadjusted Ischemic Stroke Rate (% per year) is equal to 3.2 % stroke rate/year from a score of 3 (CHF, DMm, HTN) - continue heparin drip - consider moving to NOAC   2. New diagnosis of diastolic heart failure - grade 1 DD by echo this admission - CXR with edema, no repeat -  pt is overall net negative 9.9 L with 4.6 L urine output yesterday - consider switching 40 mg IV lasix to PO - sCr normal 0.89 - I discussed the importance of low sodium diet, avoidance of fried foods, and daily weights - continue educating daily   3. Acute on chronic respiratory failure - WBC 12.6 - per nursing, pt only required CPAP overnight and no breathing treatments - continue ABX per primary team - pt states he does not have incentive spirometer or flutter valve in room - will order both and increased mucinex dose    4. DM - glucose was 410 yesterday - continue with lantus and SSI per primary team - wife is bringing in food (ribs last night) - discussed low sodium diet in the setting of diastolic heart failure    Signed, Marcelino Dusterngela Nicole Duke , PA-C 7:52 AM 07/27/2017 Pager: [phone]  As above, patient seen and examined.  Patient states his dyspnea is improving.  He denies chest pain or palpitations.  Heart rate improved.  1 paroxysmal atrial fibrillation-patient remains in atrial fibrillation today.  I will discontinue IV Cardizem and begin 90 mg every 6 hours.  We will transition to CD later once his dose is stable.  Continue digoxin.  I will discontinue heparin and treat with apixaban.  Atrial fibrillation is likely secondary to acute pulmonary exacerbation.  If he remains in atrial fibrillation in 4-6 weeks we could proceed with  elective cardioversion.  Note TSH is normal and LV function is normal.  2 COPD/pulmonary exacerbation-management per primary care.  3 acute diastolic congestive heart failure-we will transition Lasix to 40 mg oral daily.  Follow renal function.    4 diabetes mellitus-management per primary care.  5 HIV-continue preadmission meds  6 tobacco abuse-pt counseled on discontinuing  Olga MillersBrian Avarie Tavano, MD

## 2017-07-27 NOTE — Progress Notes (Addendum)
Inpatient Diabetes Program Recommendations  AACE/[name]: New Consensus Statement on Inpatient Glycemic Control (2015)  Target Ranges:  Prepandial:   less than 140 mg/dL      Peak postprandial:   less than 180 mg/dL (1-2 hours)      Critically ill patients:  140 - 180 mg/dL   Results for Chris [name], Chris A JR. (MRN [id number]) as of 07/27/2017 07:39  Ref. Range 07/26/2017 07:43 07/26/2017 12:04 07/26/2017 16:47 07/26/2017 20:08 07/26/2017 20:29  Glucose-Capillary Latest Ref Range: 65 - 99 mg/dL 409324 (H)  20 units Novolog 370 (H)  25 units Novolog +   30 units Lantus  332 (H)  20 units Novolog 490 (H)  5 units Novolog +   30 units Lantus 382 (H)   Results for Chris [name], Chris A JR. (MRN [id number]) as of 07/27/2017 07:39  Ref. Range 07/27/2017 07:17  Glucose-Capillary Latest Ref Range: 65 - 99 mg/dL 956273 (H)    Home DM Meds: Lantus 25 units QHS       Metformin 1000 mg BID  Current Insulin Orders: Lantus 30 units BID      Novolog Resistant Correction Scale/ SSI (0-20 units) TID AC + HS      Novolog 5 units TID with meals       Note Solumedrol reduced to 60 mg BID yesterday.  Also note that Lantus dose increased to 30 units BID yesterday as well.  Patient currently receiving double the amount of Lantus he takes at home likely due to current steroids.  Still quite elevated this AM: CBG 273 mg/dl.  MD- Please consider the following:  1. Increase Lantus further to 35 units BID (will likely need to decrease dose as steroids are reduced)  2. Increase Novolog Meal Coverage to: Novolog 8 units TID with meals (hold if pt eats <50% of meal)     --Will follow patient during hospitalization--  Ambrose FinlandJeannine Johnston Naeem Quillin RN, MSN, CDE Diabetes Coordinator Inpatient Glycemic Control Team Team Pager: [phone] (8a-5p)

## 2017-07-27 NOTE — Plan of Care (Signed)
Discussed with patient plan of care, pain management, nutrition of home foods his wife brings in as related to his disease process with some teach back displayed.

## 2017-07-27 NOTE — Progress Notes (Signed)
ANTICOAGULATION CONSULT NOTE - Follow Up Consult  Pharmacy Consult for heparin Indication: atrial fibrillation  Labs: Recent Labs    07/25/17 0500 07/26/17 0434 07/27/17 0426  HGB 15.0 14.7 14.4  HCT 49.4 48.1 46.1  PLT 195 187 185  HEPARINUNFRC 0.39 0.47 0.79*  CREATININE 0.89  --   --     Assessment: 51yo male now above goal after several levels at goal; lab drawn correctly from opposite arm, Hgb stable.  Goal of Therapy:  Heparin level 0.3-0.7 units/ml   Plan:  Will decrease heparin gtt slightly to 1500 units/hr and check level in 6hr.  Vernard GamblesVeronda Ronni Osterberg, PharmD, BCPS  07/27/2017,6:13 AM

## 2017-07-27 NOTE — Progress Notes (Signed)
PROGRESS NOTE    Chris RiversJerome A Jansma Jr.  [id number] DOB: 01/09/1966 DOA: 07/21/2017 PCP: Chris [name]   Brief Narrative: Chris FavorsJerome A Fyfe [name] is a 51 y.o. male with history of diabetes, GERD, HIV, tobacco use, COPD on oxygen only as needed at home presented with shortness of breath. He has been treated for COPD exacerbation and atrial fibrillation with RVR with cardizem and digoxin. Cardiology on board. Off of Bipap.   Assessment & Plan:   Active Problems:   HIV disease (HCC)   COPD  GOLD III, still smoking   Steroid-induced hyperglycemia   Chronic respiratory failure with hypoxia and hypercapnia (HCC)   DM (diabetes mellitus) (HCC)   Obstructive sleep apnea syndrome   Essential hypertension, benign   Atopic eczema   Type 2 diabetes mellitus with complication (HCC)   Morbid (severe) obesity due to excess calories (HCC)   COPD with acute exacerbation (HCC)   Depression   GERD (gastroesophageal reflux disease)   Acute on chronic respiratory failure with hypoxia (HCC)   Acute on chronic respiratory failure with hypoxia Secondary to COPD exacerbation. Patient stable off of BiPAP.  -transition to prednisone -Continue bronchodilators/Dulera  New onset atrial fibrillation with RVR Rate controlled on cardizem drip and diltiazem -cardiology recommendations: transition to PO cardizem, continue diltiazem, started on Eliquis  Leukocytosis Stable. No evidence of infection. Likely secondary to steroids  Diabetes mellitus, type 2 Difficult control secondary to steroids. Transitioning to oral. Watch blood sugar while managing steroid dosage. -Continue Lantus and SSI  Hyperlipidemia -Continue atorvastatin  OSA -Continue CPAP qhs  GERD -Continue Protonix  HIV -continue Biktarvy   DVT prophylaxis: Lovenox Code Status: Full code Family Communication: None at bedside Disposition Plan: home when medically stable   Consultants:   Cardiology  Procedures:    Cardizem drip  Bipap  Antimicrobials:  Azithromycin    Subjective: Breathing better. No chest pain.  Objective: Vitals:   07/27/17 1200 07/27/17 1226 07/27/17 1412 07/27/17 1618  BP: 95/64 95/64 (!) 125/113 (!) 122/93  Pulse: 100  99 84  Resp: 20  (!) 21 (!) 22  Temp:    98.2 F (36.8 C)  TempSrc:    Oral  SpO2: (!) 57%  93% 98%  Weight:      Height:        Intake/Output Summary (Last 24 hours) at 07/27/2017 1646 Last data filed at 07/27/2017 1300 Gross per 24 hour  Intake 2002.77 ml  Output 2100 ml  Net -97.23 ml   Filed Weights   07/24/17 0500 07/26/17 0254 07/27/17 0132  Weight: 112.7 kg (248 lb 7.3 oz) 113.6 kg (250 lb 7.1 oz) 113.7 kg (250 lb 10.6 oz)    Examination:  General exam: Appears calm and comfortable Respiratory system: Clear to auscultation but significantly diminished. Respiratory effort normal. Speaks in complete sentences. Cardiovascular system: S1 & S2 heard, Irregular rhythm, normal rate. Gastrointestinal system: Abdomen is nondistended, soft and nontender. Normal bowel sounds heard. Central nervous system: Alert and oriented. No focal neurological deficits. Extremities: No calf tenderness Skin: No cyanosis. No rashes Psychiatry: Judgement and insight appear normal. Mood & affect appropriate.     Data Reviewed: I have personally reviewed following labs and imaging studies  CBC: Recent Labs  Lab 07/23/17 0514 07/24/17 0414 07/25/17 0500 07/26/17 0434 07/27/17 0426  WBC 14.2* 14.2* 14.4* 13.2* 12.6*  NEUTROABS 12.4* 12.1*  --   --   --   HGB 14.8 14.8 15.0 14.7 14.4  HCT 49.6 49.2  49.4 48.1 46.1  MCV 96.3 96.7 95.0 94.5 92.2  PLT 208 209 195 187 185   Basic Metabolic Panel: Recent Labs  Lab 07/21/17 0908 07/22/17 0412 07/23/17 0514 07/24/17 0414 07/25/17 0500 07/25/17 1618  NA 135 134* 133* 137 138  --   K 4.2 4.7 5.1 4.8 4.3  --   CL 98* 95* 93* 93* 93*  --   CO2 30 33* 33* 34* 36*  --   GLUCOSE 187* 239* 250*  262* 300* 410*  BUN <5* 11 21* 22* 29*  --   CREATININE 0.79 0.74 0.86 0.89 0.89  --   CALCIUM 9.0 8.7* 8.6* 9.0 8.9  --   MG  --   --  2.5* 2.7*  --   --    GFR: Estimated Creatinine Clearance: 122.1 mL/min (by C-G formula based on SCr of 0.89 mg/dL). Liver Function Tests: Recent Labs  Lab 07/22/17 0412  AST 14*  ALT 22  ALKPHOS 47  BILITOT 0.7  PROT 8.7*  ALBUMIN 3.7   No results for input(s): LIPASE, AMYLASE in the last 168 hours. No results for input(s): AMMONIA in the last 168 hours. Coagulation Profile: No results for input(s): INR, PROTIME in the last 168 hours. Cardiac Enzymes: Recent Labs  Lab 07/21/17 0908  TROPONINI <0.03   BNP (last 3 results) No results for input(s): PROBNP in the last 8760 hours. HbA1C: No results for input(s): HGBA1C in the last 72 hours. CBG: Recent Labs  Lab 07/26/17 2008 07/26/17 2029 07/27/17 0717 07/27/17 1135 07/27/17 1621  GLUCAP 490* 382* 273* 321* 307*   Lipid Profile: No results for input(s): CHOL, HDL, LDLCALC, TRIG, CHOLHDL, LDLDIRECT in the last 72 hours. Thyroid Function Tests: Recent Labs    07/25/17 0500  TSH 0.487   Anemia Panel: No results for input(s): VITAMINB12, FOLATE, FERRITIN, TIBC, IRON, RETICCTPCT in the last 72 hours. Sepsis Labs: Recent Labs  Lab 07/24/17 1806  PROCALCITON 0.12    Recent Results (from the past 240 hour(s))  MRSA PCR Screening     Status: None   Collection Time: 07/21/17  6:31 PM  Result Value Ref Range Status   MRSA by PCR NEGATIVE NEGATIVE Final    Comment:        The GeneXpert MRSA Assay (FDA approved for NASAL specimens only), is one component of a comprehensive MRSA colonization surveillance program. It is not intended to diagnose MRSA infection nor to guide or monitor treatment for MRSA infections.          Radiology Studies: No results found.      Scheduled Meds: . apixaban  5 mg Oral BID  . atorvastatin  20 mg Oral Daily  . azithromycin  500  mg Oral Daily  . bictegravir-emtricitabine-tenofovir AF  1 tablet Oral Q1500  . [START ON 07/28/2017] digoxin  0.125 mg Oral Daily  . diltiazem  90 mg Oral Q6H  . [START ON 07/28/2017] furosemide  40 mg Oral Daily  . insulin aspart  0-20 Units Subcutaneous TID WC  . insulin aspart  0-5 Units Subcutaneous QHS  . insulin aspart  5 Units Subcutaneous TID WC  . insulin glargine  30 Units Subcutaneous BID  . ipratropium-albuterol  3 mL Nebulization Q6H  . methylPREDNISolone (SOLU-MEDROL) injection  60 mg Intravenous Q12H  . mometasone-formoterol  2 puff Inhalation BID  . pantoprazole  40 mg Oral BID AC  . tiotropium  18 mcg Inhalation Daily   Continuous Infusions:   LOS: 5 days  Jacquelin Hawking, [name] [facility] 07/27/2017, 4:46 PM Pager: [phone]  If 7PM-7AM, please contact night-coverage [email or web address] Password [facility] 07/27/2017, 4:46 PM

## 2017-07-27 NOTE — Plan of Care (Signed)
Discussed with patient and wife the importance of eating items with low carbohydrates and sodium especially on steroids with some teach back displayed

## 2017-07-28 ENCOUNTER — Telehealth: Payer: Self-pay | Admitting: Internal Medicine

## 2017-07-28 DIAGNOSIS — R29818 Other symptoms and signs involving the nervous system: Secondary | ICD-10-CM

## 2017-07-28 DIAGNOSIS — I480 Paroxysmal atrial fibrillation: Secondary | ICD-10-CM

## 2017-07-28 DIAGNOSIS — K219 Gastro-esophageal reflux disease without esophagitis: Secondary | ICD-10-CM

## 2017-07-28 DIAGNOSIS — E118 Type 2 diabetes mellitus with unspecified complications: Secondary | ICD-10-CM

## 2017-07-28 DIAGNOSIS — B20 Human immunodeficiency virus [HIV] disease: Secondary | ICD-10-CM

## 2017-07-28 DIAGNOSIS — Z794 Long term (current) use of insulin: Secondary | ICD-10-CM

## 2017-07-28 DIAGNOSIS — G4733 Obstructive sleep apnea (adult) (pediatric): Secondary | ICD-10-CM

## 2017-07-28 DIAGNOSIS — F329 Major depressive disorder, single episode, unspecified: Secondary | ICD-10-CM

## 2017-07-28 LAB — CBC
HCT: 46 % (ref 39.0–52.0)
Hemoglobin: 14.4 g/dL (ref 13.0–17.0)
MCH: 28.6 pg (ref 26.0–34.0)
MCHC: 31.3 g/dL (ref 30.0–36.0)
MCV: 91.5 fL (ref 78.0–100.0)
Platelets: 170 10*3/uL (ref 150–400)
RBC: 5.03 MIL/uL (ref 4.22–5.81)
RDW: 12.2 % (ref 11.5–15.5)
WBC: 13.5 10*3/uL — ABNORMAL HIGH (ref 4.0–10.5)

## 2017-07-28 LAB — GLUCOSE, CAPILLARY
GLUCOSE-CAPILLARY: 289 mg/dL — AB (ref 65–99)
Glucose-Capillary: 238 mg/dL — ABNORMAL HIGH (ref 65–99)
Glucose-Capillary: 370 mg/dL — ABNORMAL HIGH (ref 65–99)
Glucose-Capillary: 404 mg/dL — ABNORMAL HIGH (ref 65–99)

## 2017-07-28 LAB — BASIC METABOLIC PANEL
Anion gap: 6 (ref 5–15)
BUN: 27 mg/dL — AB (ref 6–20)
CALCIUM: 8.7 mg/dL — AB (ref 8.9–10.3)
CHLORIDE: 95 mmol/L — AB (ref 101–111)
CO2: 34 mmol/L — ABNORMAL HIGH (ref 22–32)
CREATININE: 0.85 mg/dL (ref 0.61–1.24)
Glucose, Bld: 309 mg/dL — ABNORMAL HIGH (ref 65–99)
Potassium: 4.3 mmol/L (ref 3.5–5.1)
SODIUM: 135 mmol/L (ref 135–145)

## 2017-07-28 MED ORDER — DOXYCYCLINE HYCLATE 100 MG PO TABS: 100.0000 mg | ORAL_TABLET | Freq: Two times a day (BID) | ORAL | 0 refills | Status: DC

## 2017-07-28 MED ORDER — APIXABAN 5 MG PO TABS: 5.0000 mg | ORAL_TABLET | Freq: Two times a day (BID) | ORAL | 1 refills | Status: DC

## 2017-07-28 MED ORDER — DILTIAZEM HCL ER COATED BEADS 180 MG PO CP24
360.0000 mg | ORAL_CAPSULE | Freq: Every day | ORAL | Status: DC
  Administered 2017-07-28: 360 mg via ORAL
  Filled 2017-07-28: qty 2

## 2017-07-28 MED ORDER — INSULIN GLARGINE 100 UNIT/ML ~~LOC~~ SOLN: 30.0000 [IU] | Freq: Every morning | SUBCUTANEOUS | 3 refills | Status: DC

## 2017-07-28 MED ORDER — GUAIFENESIN ER 600 MG PO TB12: 600.0000 mg | ORAL_TABLET | Freq: Two times a day (BID) | ORAL | 1 refills | Status: DC

## 2017-07-28 MED ORDER — DILTIAZEM HCL ER COATED BEADS 360 MG PO CP24: 360.0000 mg | ORAL_CAPSULE | Freq: Every day | ORAL | 1 refills | Status: DC

## 2017-07-28 MED ORDER — PREDNISONE 20 MG PO TABS: ORAL_TABLET | ORAL | 0 refills | Status: DC

## 2017-07-28 MED ORDER — FUROSEMIDE 20 MG PO TABS: 20.0000 mg | ORAL_TABLET | Freq: Every day | ORAL | 1 refills | Status: DC

## 2017-07-28 MED ORDER — FUROSEMIDE 20 MG PO TABS
20.0000 mg | ORAL_TABLET | Freq: Every day | ORAL | Status: DC
Start: 2017-07-29 — End: 2017-07-28

## 2017-07-28 NOTE — Telephone Encounter (Signed)
Pt called to request a referral to s leep study test done since he has been in the ED for a few day, please follow up

## 2017-07-28 NOTE — Progress Notes (Signed)
Pt arrived to unit via wheelchair and ambulated to bed. Identified appropriately and assessed on arrival. Skin intact, BP 142/78, MAP 98, P 88, RR 18, O2 99. Respiratory assisted pt with CPAP setup. Placed pt on telemetry and ordered portable continuous pulse ox. Pt denies any pain at this time. Will continue to monitor and assess.

## 2017-07-28 NOTE — Progress Notes (Signed)
Progress Note  Patient Name: Chris RiversJerome A Esham Jr. Date of Encounter: 07/28/2017  Primary Cardiologist: Dr. Delton SeeNelson  Subjective   No chest pain; denies dyspnea  Inpatient Medications    Scheduled Meds: . apixaban  5 mg Oral BID  . atorvastatin  20 mg Oral Daily  . bictegravir-emtricitabine-tenofovir AF  1 tablet Oral Q1500  . digoxin  0.125 mg Oral Daily  . diltiazem  90 mg Oral Q6H  . furosemide  40 mg Oral Daily  . insulin aspart  0-20 Units Subcutaneous TID WC  . insulin aspart  0-5 Units Subcutaneous QHS  . insulin aspart  5 Units Subcutaneous TID WC  . insulin glargine  30 Units Subcutaneous BID  . ipratropium-albuterol  3 mL Nebulization Q6H  . mometasone-formoterol  2 puff Inhalation BID  . pantoprazole  40 mg Oral BID AC  . predniSONE  40 mg Oral Q breakfast  . tiotropium  18 mcg Inhalation Daily   Continuous Infusions:  PRN Meds: acetaminophen **OR** acetaminophen, albuterol, bisacodyl, guaiFENesin, HYDROcodone-acetaminophen, metoprolol tartrate, nicotine polacrilex, ondansetron **OR** ondansetron (ZOFRAN) IV, senna-docusate, sodium chloride   Vital Signs    Vitals:   07/28/17 0300 07/28/17 0517 07/28/17 0911 07/28/17 0919  BP:  132/75    Pulse:  77 72   Resp:  18 20   Temp:  98.5 F (36.9 C)    TempSrc:  Oral    SpO2: 97% 98% 92% 96%  Weight:      Height:        Intake/Output Summary (Last 24 hours) at 07/28/2017 1205 Last data filed at 07/28/2017 1111 Gross per 24 hour  Intake 1981.5 ml  Output 3850 ml  Net -1868.5 ml   Filed Weights   07/24/17 0500 07/26/17 0254 07/27/17 0132  Weight: 248 lb 7.3 oz (112.7 kg) 250 lb 7.1 oz (113.6 kg) 250 lb 10.6 oz (113.7 kg)     Physical Exam   General: WD/WN, NAD Head: Normal Neck: Supple Lungs:  Diminished BS Heart: irregular Abdomen: Soft, NT/ND Extremities: No edema.  Neuro: grossly intact   Labs    Chemistry Recent Labs  Lab 07/22/17 0412  07/24/17 0414 07/25/17 0500 07/25/17 1618  07/28/17 0408  NA 134*   < > 137 138  --  135  K 4.7   < > 4.8 4.3  --  4.3  CL 95*   < > 93* 93*  --  95*  CO2 33*   < > 34* 36*  --  34*  GLUCOSE 239*   < > 262* 300* 410* 309*  BUN 11   < > 22* 29*  --  27*  CREATININE 0.74   < > 0.89 0.89  --  0.85  CALCIUM 8.7*   < > 9.0 8.9  --  8.7*  PROT 8.7*  --   --   --   --   --   ALBUMIN 3.7  --   --   --   --   --   AST 14*  --   --   --   --   --   ALT 22  --   --   --   --   --   ALKPHOS 47  --   --   --   --   --   BILITOT 0.7  --   --   --   --   --   GFRNONAA >60   < > >60 >60  --  >  60  GFRAA >60   < > >60 >60  --  >60  ANIONGAP 6   < > 10 9  --  6   < > = values in this interval not displayed.     Hematology Recent Labs  Lab 07/26/17 0434 07/27/17 0426 07/28/17 0408  WBC 13.2* 12.6* 13.5*  RBC 5.09 5.00 5.03  HGB 14.7 14.4 14.4  HCT 48.1 46.1 46.0  MCV 94.5 92.2 91.5  MCH 28.9 28.8 28.6  MCHC 30.6 31.2 31.3  RDW 12.3 12.3 12.2  PLT 187 185 170    Telemetry    Afib in the 90s - Personally Reviewed   Cardiac Studies   2d echo 07/22/17 Study Conclusions  - Procedure narrative: Transthoracic echocardiography. Image quality was poor. The study was technically difficult, as a result of poor acoustic windows, poor sound wave transmission, and body habitus. - Left ventricle: The cavity size was normal. Wall thickness was increased in a pattern of moderate LVH. Systolic function was normal. The estimated ejection fraction was in the range of 60% to 65%. Wall motion was normal; there were no regional wall motion abnormalities. Doppler parameters are consistent with abnormal left ventricular relaxation (grade 1 diastolic dysfunction). The E/e&' ratio is between 8-15, suggesting indeterminate LV filling pressure. - Mitral valve: Mildly thickened leaflets . There was mild regurgitation. - Left atrium: The atrium was normal in size. - Inferior vena cava: The vessel was dilated. The  respirophasic diameter changes were blunted (<50%), consistent with elevated central venous pressure.  Impressions:  - Technically difficult study. Compared to a prior study in 08/2015, the LVEF is unchanged at 60-65%. There is now grade 1 DD with indetermiante LV filling pressure.  Patient Profile     51 y.o. male with HIV, GERD, COPD with chronic respiratory failure on home O2 with ongoing tobacco abuse, DM, depression who was admitted on 07/21/17 for acute exacerbation of COPD, found to have new onset atrial fibrillation. CTA neg for PE, does not specifically comment on coronary calcification, BNP 30 and troponin neg. Started digoxin over the weekend, able to decrease dilt drip  Assessment & Plan    1 paroxysmal atrial fibrillation-patient back in sinus.  Change cadizem to CD 360 mg daily. DC digoxin. Continue apixaban.  Atrial fibrillation was likely secondary to acute pulmonary exacerbation. Note TSH is normal and LV function is normal.  2 COPD/pulmonary exacerbation-management per primary care.  3 acute diastolic congestive heart failure-change lasix to 20 mg daily  4 diabetes mellitus-management per primary care.  5 HIV-continue preadmission meds  6 tobacco abuse-pt counseled on discontinuing  Pt can be DCed from a cardiac standpoint and fu with Dr Delton SeeNelson in 6-8 weeks  Olga MillersBrian Crenshaw, MD

## 2017-07-28 NOTE — Care Management Important Message (Signed)
Important Message  Patient Details  Name: Chris RiversJerome A Garceau Jr. MRN: [id number] Date of Birth: 05/21/1966   Medicare Important Message Given:  Yes    Tylene Quashie Abena 07/28/2017, 10:10 AM

## 2017-07-28 NOTE — Progress Notes (Signed)
Inpatient Diabetes Program Recommendations  AACE/[name]: New Consensus Statement on Inpatient Glycemic Control (2015)  Target Ranges:  Prepandial:   less than 140 mg/dL      Peak postprandial:   less than 180 mg/dL (1-2 hours)      Critically ill patients:  140 - 180 mg/dL   Results for Chris RiversWARD, Chris [name]. (MRN [id number]) as of 07/28/2017 10:22  Ref. Range 07/27/2017 07:17 07/27/2017 11:35 07/27/2017 16:21 07/27/2017 20:30 07/28/2017 08:11  Glucose-Capillary Latest Ref Range: 65 - 99 mg/dL 409273 (H) 811321 (H) 914307 (H) 289 (H) 238 (H)   Review of Glycemic Control  Diabetes history: DM2 Outpatient Diabetes medications: Lantus 25 units QHS, Metformin 1000 BID Current orders for Inpatient glycemic control: Lantus 30 units BID, Novolog 5 units TID with meals for meal coverage, Novolog 0-20 units TID with meals, Novolog 0-5 units QHS; Prednisone 40 mg QAM  Inpatient Diabetes Program Recommendations:  Insulin - Basal: Note steroids changed to PO and decreased. Fasting glucose 238 mg/dl. Please consider increasing Lantus to 33 units BID. Insulin - Meal Coverage: Please consider increasing meal coverage to Novolog 8 units TID with meals.  Thanks, Chris PennerMarie Dia Donate, RN, MSN, CDE Diabetes Coordinator Inpatient Diabetes Program [phone] (Team Pager from 8am to 5pm)

## 2017-07-28 NOTE — Discharge Summary (Signed)
Physician Discharge Summary  Chris [name]. UEA:[id number] DOB: 1965/12/16 DOA: 07/21/2017  PCP: Tresa Garter, MD  Admit date: 07/21/2017 Discharge date: 07/28/2017  Time spent: 35 minutes  Recommendations for Outpatient Follow-up:  1. Repeat basic metabolic panel to follow electrolytes and renal function 2. Patient needs outpatient sleep study to sit up and determine candidacy for CPAP. 3. Reassess volume status and blood pressure, adjust medications as needed. 4. Close follow-up to patient's CBG and A1c; further adjust hypoglycemic regimen as indicated.  Discharge Diagnoses:  Active Problems:   HIV disease ([place])   COPD  GOLD III, still smoking   Steroid-induced hyperglycemia   Chronic respiratory failure with hypoxia and hypercapnia (HCC)   DM (diabetes mellitus) (HCC)   COPD exacerbation (HCC)   Obstructive sleep apnea syndrome   Essential hypertension, benign   Atopic eczema   Type 2 diabetes mellitus with complication (HCC)   Morbid (severe) obesity due to excess calories (HCC)   COPD with acute exacerbation (HCC)   Depression   GERD (gastroesophageal reflux disease)   Acute on chronic respiratory failure with hypoxia ([place])   Discharge Condition: Stable and improved.  Discharged home with instruction to follow-up with PCP in 1 week and with the cardiology service in 6 weeks.  Diet recommendation: heart healthy diet and modified carbohydrates   Filed Weights   07/24/17 0500 07/26/17 0254 07/27/17 0132  Weight: 112.7 kg (248 lb 7.3 oz) 113.6 kg (250 lb 7.1 oz) 113.7 kg (250 lb 10.6 oz)    History of present illness:  As per H&P written by Dr. Denton Brick and PA Sharene Butters on 07/21/17 51 y.o. male with medical history significant for diabetes, GERD, HIV disease, tobacco use, any history of COPD on oxygen as needed at home, with several presentations to the ED for exacerbation, latest on 06/07/2017, now seen at the ED, with significant shortness of breath,  especially for the last 3 days, accompanied by yellow sputum.  He denies any fever or chills.  He denies any night sweats.  He denies any nausea or vomiting.  Cannot speak full sentences due to his shortness of breath. He has been tried to use his nebulizer at home, with little relief.  He denies any recent infections, but he admits to sick contacts in his family members about 1 week ago.  He denies any abdominal pain.  No dysuria or gross hematuria.  He denies any lower extremity swelling or calf pain.  When EMS saw the patient, he was diaphoretic, tachypneic, was wheezing, requiring nebulizer treatments and Solu-Medrol on transport However, he is sats dropped to 83, for which he required to be placed in BiPAP, with improvement of his O2 sats.  His symptoms continue to be severe.[name]  Hospital Course:  Acute on chronic respiratory failure with hypoxia -Secondary to COPD exacerbation and acute diastolic HF. -Patient require BiPAP initially and is now with good O2 sat on 2.5L [place] (home baseline) -will continue steroids tapering, mucinex and doxicycline -continue symbicort and PRN duoneb -following cardiology rec's will discharge on lasix 54m daily  Acute diastolic HF -will discharge on lasix 20 mg daily -advise to follow low sodium diet and to check weight on daily basis -follow up with cardiology service in 6 weeks  New onset atrial fibrillation with RVR -rate controlled and back to sinus on cardizem -will discharge on Cardizem 360 mg daily; no digoxin recommended by cardiology. -will discharge on Eliquis   Leukocytosis -most likely due to steroids  -will also  cover with doxycycline for bronchiectasis component.  Diabetes mellitus, type 2 -with hyperglycemia due to steroids. -will expect improvement on CBG's with tapering of steroids -resume metformin and lantus; las one increased to 30 units for better overall control of diabetes. -advise to follow low carb diet    Hyperlipidemia -Continue atorvastatin  OSA -patient will need Sleep study to confirmed OSA -good response to empirically use of CPAP while inpatient   GERD -Continue Protonix  HIV -continue Biktarvy -Continue outpatient follow-up with infectious disease service  Procedures:  See below for x-ray reports.  2-D Echo - Procedure narrative: Transthoracic echocardiography. Image   quality was poor. The study was technically difficult, as a   result of poor acoustic windows, poor sound wave transmission,   and body habitus. - Left ventricle: The cavity size was normal. Wall thickness was   increased in a pattern of moderate LVH. Systolic function was   normal. The estimated ejection fraction was in the range of 60%   to 65%. Wall motion was normal; there were no regional wall   motion abnormalities. Doppler parameters are consistent with   abnormal left ventricular relaxation (grade 1 diastolic   dysfunction). The E/e&' ratio is between 8-15, suggesting   indeterminate LV filling pressure. - Mitral valve: Mildly thickened leaflets . There was mild   regurgitation. - Left atrium: The atrium was normal in size. - Inferior vena cava: The vessel was dilated. The respirophasic   diameter changes were blunted (< 50%), consistent with elevated   central venous pressure.  Impressions: - Technically difficult study. Compared to a prior study in 08/2015,   the LVEF is unchanged at 60-65%. There is now grade 1 DD with   indetermiante LV filling pressure.   Consultations:  Cardiology  Discharge Exam: Vitals:   07/28/17 1356 07/28/17 1426  BP: (!) 171/64   Pulse: (!) 51   Resp: (!) 22   Temp: 98.4 F (36.9 C)   SpO2: 94% 92%   General exam:  In no acute distress, denies chest pain, breathing significantly improved; no palpitations, no nausea vomiting. Speaking in full sentences. Respiratory system: Improved air movement, very mild exp wheezing, no crackles.  Good  oxygen saturation 2.5 L nasal cannula (according to patient essentially at baseline for him). Cardiovascular system:  Rate control, sinus rhythm, no rubs, no gallops, no murmurs.  No JVD on exam.  Gastrointestinal system:  Soft, nontender, nondistended, positive bowel sounds.  Central nervous system:  Alert, awake and oriented x3, no focal neurologic deficits; cranial nerve grossly intact. Extremities: Trace edema bilaterally, no cyanosis, no clubbing. Skin: No open wounds, no rashes, no petechiae Psychiatry:  Normal judgment and insight, mood is appropriate and stable.   Discharge Instructions   Discharge Instructions    (HEART FAILURE PATIENTS) Call MD:  Anytime you have any of the following symptoms: 1) 3 pound weight gain in 24 hours or 5 pounds in 1 week 2) shortness of breath, with or without a dry hacking cough 3) swelling in the hands, feet or stomach 4) if you have to sleep on extra pillows at night in order to breathe.   Complete by:  As directed    Diet - low sodium heart healthy   Complete by:  As directed    Discharge instructions   Complete by:  As directed    Stop smoking Take medications as prescribed Arrange follow-up with PCP in 1 week Follow heart healthy diet maintain adequate hydration Check your weight on daily  basis Follow up with cardiology service in 6 weeks     Allergies as of 07/28/2017      Reactions   Bactrim [sulfamethoxazole-trimethoprim] Hives      Medication List    STOP taking these medications   levofloxacin 500 MG tablet Commonly known as:  LEVAQUIN     TAKE these medications   ACCU-CHEK AVIVA PLUS w/Device Kit 250 Test up to 4 times daily   acetaminophen-codeine 300-30 MG tablet Commonly known as:  TYLENOL #3 Take 1 tablet by mouth every 8 (eight) hours as needed for moderate pain.   albuterol 108 (90 Base) MCG/ACT inhaler Commonly known as:  PROVENTIL HFA;VENTOLIN HFA Inhale 2 puffs into the lungs every 4 (four) hours as needed  for wheezing or shortness of breath (((PLAN B))).   apixaban 5 MG Tabs tablet Commonly known as:  ELIQUIS Take 1 tablet (5 mg total) by mouth 2 (two) times daily.   atorvastatin 20 MG tablet Commonly known as:  LIPITOR Take 1 tablet (20 mg total) by mouth daily.   bictegravir-emtricitabine-tenofovir AF 50-200-25 MG Tabs tablet Commonly known as:  BIKTARVY Take 1 tablet by mouth daily. What changed:  when to take this   budesonide-formoterol 160-4.5 MCG/ACT inhaler Commonly known as:  SYMBICORT Take 2 puffs first thing in am and then another 2 puffs about 12 hours later. What changed:    how much to take  how to take this  when to take this  additional instructions   clotrimazole 1 % cream Commonly known as:  LOTRIMIN Apply 1 application topically 2 (two) times daily.   diltiazem 360 MG 24 hr capsule Commonly known as:  CARDIZEM CD Take 1 capsule (360 mg total) by mouth daily. Start taking on:  07/29/2017   doxycycline 100 MG tablet Commonly known as:  VIBRA-TABS Take 1 tablet (100 mg total) by mouth 2 (two) times daily.   EZ SMART BLOOD GLUCOSE LANCETS Misc 1 Units by Does not apply route 4 (four) times daily.   ACCU-CHEK SOFTCLIX LANCETS lancets Use as instructed   furosemide 20 MG tablet Commonly known as:  LASIX Take 1 tablet (20 mg total) by mouth daily. Start taking on:  07/29/2017   glucose blood test strip Use as instructed   guaiFENesin 600 MG 12 hr tablet Commonly known as:  MUCINEX Take 1 tablet (600 mg total) by mouth 2 (two) times daily.   insulin glargine 100 UNIT/ML injection Commonly known as:  LANTUS Inject 0.3 mLs (30 Units total) into the skin every morning. What changed:  how much to take   Insulin Pen Needle 32G X 5 MM Misc Use as directed   Insulin Syringe-Needle U-100 31G X 15/64" 0.5 ML Misc Commonly known as:  BD INSULIN SYRINGE ULTRAFINE Use as directed   ipratropium-albuterol 0.5-2.5 (3) MG/3ML Soln Commonly known as:   DUONEB Take 3 mLs by nebulization every 6 (six) hours as needed. What changed:  reasons to take this   metFORMIN 1000 MG tablet Commonly known as:  GLUCOPHAGE Take 1 tablet (1,000 mg total) by mouth 2 (two) times daily with a meal.   OXYGEN Place 2 L into the nose as needed (shortness of breath).   pantoprazole 40 MG tablet Commonly known as:  PROTONIX Take 1 tablet (40 mg total) by mouth 2 (two) times daily before a meal.   predniSONE 20 MG tablet Commonly known as:  DELTASONE Take 2 tablet by mouth daily times 2 days; then 1 tablet by mouth daily  times 3 days; then 1/2 tablet by mouth daily times 3 days and stop prednisone. What changed:    medication strength  additional instructions   sildenafil 50 MG tablet Commonly known as:  VIAGRA Take 0.5 tablets (25 mg total) by mouth daily as needed for erectile dysfunction.   Tiotropium Bromide Monohydrate 2.5 MCG/ACT Aers Commonly known as:  SPIRIVA RESPIMAT Inhale 2 puffs into the lungs daily.   urea 20 % cream Commonly known as:  CARMOL Apply as needed topically.      Allergies  Allergen Reactions  . Bactrim [Sulfamethoxazole-Trimethoprim] Hives   Follow-up Information    Tresa Garter, MD. Schedule an appointment as soon as possible for a visit in 1 week(s).   Specialty:  Internal Medicine Contact information: [place] [address] [phone]        Carlyle Basques, MD .   Specialty:  Infectious Diseases Contact information: [place] [address] [phone]        Dorothy Spark, MD. Schedule an appointment as soon as possible for a visit in 6 week(s).   Specialty:  Cardiology Contact information: [place] [address] [phone]           The results of significant diagnostics from this hospitalization (including imaging, microbiology, ancillary and laboratory) are listed below for reference.     Significant Diagnostic Studies: X-ray Chest Pa And Lateral  Result Date: 07/23/2017 CLINICAL DATA:  COPD. EXAM: CHEST  2 VIEW COMPARISON:  07/21/2017 . FINDINGS: Mediastinum hilar structures normal. Cardiomegaly with mild pulmonary vascular prominence and bilateral interstitial prominence suggesting mild CHF. No pleural effusion or pneumothorax. No acute bony abnormality. IMPRESSION: Cardiomegaly with mild pulmonary vascular congestion and bilateral interstitial prominence suggesting mild CHF. Electronically Signed   By: Marcello Moores  Register   On: 07/23/2017 07:51   Ct Angio Chest Pe W Or Wo Contrast  Result Date: 07/23/2017 CLINICAL DATA:  Shortness of breath, COPD EXAM: CT ANGIOGRAPHY CHEST WITH CONTRAST TECHNIQUE: Multidetector CT imaging of the chest was performed using the standard protocol during bolus administration of intravenous contrast. Multiplanar CT image reconstructions and MIPs were obtained to evaluate the vascular anatomy. CONTRAST:  52m ISOVUE-370 IOPAMIDOL (ISOVUE-370) INJECTION 76% COMPARISON:  None. FINDINGS: Cardiovascular: No filling defects in the pulmonary arteries to suggestpulmonary emboli. Heart is mildly enlarged. Aorta is normal caliber. Mediastinum/Nodes: No mediastinal, hilar, or axillary adenopathy. Trachea and esophagus are unremarkable. Thyroid unremarkable. Lungs/Pleura: Mild centrilobular and paraseptal emphysema. No confluent opacities or pleural effusions. Upper Abdomen: Imaging into the upper abdomen shows no acute findings. Musculoskeletal: Chest wall soft tissues are unremarkable. No acute bony abnormality. Review of the MIP images confirms the above findings. IMPRESSION: No evidence of pulmonary embolus. Mild emphysema. No acute findings. Electronically Signed   By: KRolm BaptiseM.D.   On: 07/23/2017 11:33   Dg Chest Port 1 View  Result Date: 07/21/2017 CLINICAL DATA:  Shortness of breath. EXAM: PORTABLE CHEST 1 VIEW COMPARISON:  06/07/2017, 05/26/2017 and  08/08/2016 and chest CT dated 12/12/2013 FINDINGS: Heart size and pulmonary vascularity are normal. There is slight chronic accentuation of the interstitial markings at the lung bases and the patient has bilateral prominent pericardial fat pads as demonstrated by the prior CT scan. No acute infiltrates or effusions.  No acute bone abnormality. IMPRESSION: No active disease. Electronically Signed   By: JLorriane ShireM.D.   On: 07/21/2017 09:29    Microbiology: Recent Results (from  the past 240 hour(s))  MRSA PCR Screening     Status: None   Collection Time: 07/21/17  6:31 PM  Result Value Ref Range Status   MRSA by PCR NEGATIVE NEGATIVE Final    Comment:        The GeneXpert MRSA Assay (FDA approved for NASAL specimens only), is one component of a comprehensive MRSA colonization surveillance program. It is not intended to diagnose MRSA infection nor to guide or monitor treatment for MRSA infections.      Labs: Basic Metabolic Panel: Recent Labs  Lab 07/22/17 0412 07/23/17 0514 07/24/17 0414 07/25/17 0500 07/25/17 1618 07/28/17 0408  NA 134* 133* 137 138  --  135  K 4.7 5.1 4.8 4.3  --  4.3  CL 95* 93* 93* 93*  --  95*  CO2 33* 33* 34* 36*  --  34*  GLUCOSE 239* 250* 262* 300* 410* 309*  BUN 11 21* 22* 29*  --  27*  CREATININE 0.74 0.86 0.89 0.89  --  0.85  CALCIUM 8.7* 8.6* 9.0 8.9  --  8.7*  MG  --  2.5* 2.7*  --   --   --    Liver Function Tests: Recent Labs  Lab 07/22/17 0412  AST 14*  ALT 22  ALKPHOS 47  BILITOT 0.7  PROT 8.7*  ALBUMIN 3.7   CBC: Recent Labs  Lab 07/23/17 0514 07/24/17 0414 07/25/17 0500 07/26/17 0434 07/27/17 0426 07/28/17 0408  WBC 14.2* 14.2* 14.4* 13.2* 12.6* 13.5*  NEUTROABS 12.4* 12.1*  --   --   --   --   HGB 14.8 14.8 15.0 14.7 14.4 14.4  HCT 49.6 49.2 49.4 48.1 46.1 46.0  MCV 96.3 96.7 95.0 94.5 92.2 91.5  PLT 208 209 195 187 185 170   BNP (last 3 results) Recent Labs    07/21/17 0910  BNP 30.1    CBG: Recent  Labs  Lab 07/27/17 1135 07/27/17 1621 07/27/17 2030 07/28/17 0811 07/28/17 1201  GLUCAP 321* 307* 289* 238* 370*    Signed:  Barton Dubois MD.  [facility] 07/28/2017, 4:51 PM

## 2017-07-28 NOTE — Progress Notes (Signed)
Chris Rang Glantz Jr. to be D/C'd home per MD order. Discussed with the patient and all questions fully answered.  Allergies as of 07/28/2017      Reactions   Bactrim [sulfamethoxazole-trimethoprim] Hives      Medication List    STOP taking these medications   levofloxacin 500 MG tablet Commonly known as:  LEVAQUIN     TAKE these medications   ACCU-CHEK AVIVA PLUS w/Device Kit 250 Test up to 4 times daily   acetaminophen-codeine 300-30 MG tablet Commonly known as:  TYLENOL #3 Take 1 tablet by mouth every 8 (eight) hours as needed for moderate pain.   albuterol 108 (90 Base) MCG/ACT inhaler Commonly known as:  PROVENTIL HFA;VENTOLIN HFA Inhale 2 puffs into the lungs every 4 (four) hours as needed for wheezing or shortness of breath (((PLAN B))).   apixaban 5 MG Tabs tablet Commonly known as:  ELIQUIS Take 1 tablet (5 mg total) by mouth 2 (two) times daily.   atorvastatin 20 MG tablet Commonly known as:  LIPITOR Take 1 tablet (20 mg total) by mouth daily.   bictegravir-emtricitabine-tenofovir AF 50-200-25 MG Tabs tablet Commonly known as:  BIKTARVY Take 1 tablet by mouth daily. What changed:  when to take this   budesonide-formoterol 160-4.5 MCG/ACT inhaler Commonly known as:  SYMBICORT Take 2 puffs first thing in am and then another 2 puffs about 12 hours later. What changed:    how much to take  how to take this  when to take this  additional instructions   clotrimazole 1 % cream Commonly known as:  LOTRIMIN Apply 1 application topically 2 (two) times daily.   diltiazem 360 MG 24 hr capsule Commonly known as:  CARDIZEM CD Take 1 capsule (360 mg total) by mouth daily. Start taking on:  07/29/2017   doxycycline 100 MG tablet Commonly known as:  VIBRA-TABS Take 1 tablet (100 mg total) by mouth 2 (two) times daily.   EZ SMART BLOOD GLUCOSE LANCETS Misc 1 Units by Does not apply route 4 (four) times daily.   ACCU-CHEK SOFTCLIX LANCETS lancets Use as  instructed   furosemide 20 MG tablet Commonly known as:  LASIX Take 1 tablet (20 mg total) by mouth daily. Start taking on:  07/29/2017   glucose blood test strip Use as instructed   guaiFENesin 600 MG 12 hr tablet Commonly known as:  MUCINEX Take 1 tablet (600 mg total) by mouth 2 (two) times daily.   insulin glargine 100 UNIT/ML injection Commonly known as:  LANTUS Inject 0.3 mLs (30 Units total) into the skin every morning. What changed:  how much to take   Insulin Pen Needle 32G X 5 MM Misc Use as directed   Insulin Syringe-Needle U-100 31G X 15/64" 0.5 ML Misc Commonly known as:  BD INSULIN SYRINGE ULTRAFINE Use as directed   ipratropium-albuterol 0.5-2.5 (3) MG/3ML Soln Commonly known as:  DUONEB Take 3 mLs by nebulization every 6 (six) hours as needed. What changed:  reasons to take this   metFORMIN 1000 MG tablet Commonly known as:  GLUCOPHAGE Take 1 tablet (1,000 mg total) by mouth 2 (two) times daily with a meal.   OXYGEN Place 2 L into the nose as needed (shortness of breath).   pantoprazole 40 MG tablet Commonly known as:  PROTONIX Take 1 tablet (40 mg total) by mouth 2 (two) times daily before a meal.   predniSONE 20 MG tablet Commonly known as:  DELTASONE Take 2 tablet by mouth daily times 2  days; then 1 tablet by mouth daily times 3 days; then 1/2 tablet by mouth daily times 3 days and stop prednisone. What changed:    medication strength  additional instructions   sildenafil 50 MG tablet Commonly known as:  VIAGRA Take 0.5 tablets (25 mg total) by mouth daily as needed for erectile dysfunction.   Tiotropium Bromide Monohydrate 2.5 MCG/ACT Aers Commonly known as:  SPIRIVA RESPIMAT Inhale 2 puffs into the lungs daily.   urea 20 % cream Commonly known as:  CARMOL Apply as needed topically.       VVS, Skin clean, dry and intact without evidence of skin break down, no evidence of skin tears noted.  IV catheter discontinued intact. Site  without signs and symptoms of complications. Dressing and pressure applied.  An After Visit Summary was printed and given to the patient. Portable oxygen tank provided for patient. Patient escorted via [place], and D/C home via private auto.  Chris [name]  07/28/2017 6:04 PM

## 2017-07-28 NOTE — Evaluation (Signed)
Physical Therapy Evaluation Patient Details Name: Chris RiversJerome A Hoelting Jr. MRN: [id number] DOB: 05/10/1966 Today's Date: 07/28/2017   History of Present Illness  Chris FavorsJerome A Kiraly [name] is a 51 y.o. male with medical history significant for diabetes, GERD, HIV disease, tobacco use, any history of COPD on oxygen as needed at home, with several presentations to the ED for exacerbation, latest on 06/07/2017, now seen at the ED, with significant shortness of breath, especially for the last 3 days, accompanied by yellow sputum.   Clinical Impression  Pt admitted with above. Pt functioning mod I without AD however SpO2 at 82% on 4 LO2 during ambulation. Pt with noted SOB but is steady and stable during function. Discussed energy conservation and being cognicant of SOB/dyspnea. Acute PT to con't to monitor to progress activity tolerance.   Follow Up Recommendations No PT follow up;Supervision - Intermittent    Equipment Recommendations  None recommended by PT    Recommendations for Other Services       Precautions / Restrictions Precautions Precautions: Other (comment) Precaution Comments: SOB with activity Restrictions Weight Bearing Restrictions: No      Mobility  Bed Mobility               General bed mobility comments: pt up in chair upon PT arrival  Transfers Overall transfer level: Independent Equipment used: None             General transfer comment: pt pushed up from chair, no difficulty  Ambulation/Gait Ambulation/Gait assistance: Supervision Ambulation Distance (Feet): 240 Feet Assistive device: None Gait Pattern/deviations: WFL(Within Functional Limits) Gait velocity: mildly decreased Gait velocity interpretation: Below normal speed for age/gender General Gait Details: pushed/pulled portable O2 tank, no episode of LOB. Pt on 4Lo2 via [place], SpO2 at 82% at about 150 feet. Returned to 88% upon sitting and 90% s/p 5 min rest.  Stairs            Wheelchair Mobility     Modified Rankin (Stroke Patients Only)       Balance Overall balance assessment: Modified Independent                                           Pertinent Vitals/Pain Pain Assessment: No/denies pain    Home Living Family/patient expects to be discharged to:: Private residence Living Arrangements: Spouse/significant other Available Help at Discharge: Family Type of Home: House Home Access: Level entry     Home Layout: One level Home Equipment: None Additional Comments: does have an oxgen concentrator    Prior Function Level of Independence: Independent         Comments: used O2 PRN     Hand Dominance   Dominant Hand: Right    Extremity/Trunk Assessment   Upper Extremity Assessment Upper Extremity Assessment: Overall WFL for tasks assessed    Lower Extremity Assessment Lower Extremity Assessment: Overall WFL for tasks assessed    Cervical / Trunk Assessment Cervical / Trunk Assessment: Normal  Communication   Communication: No difficulties  Cognition Arousal/Alertness: Awake/alert Behavior During Therapy: WFL for tasks assessed/performed Overall Cognitive Status: Within Functional Limits for tasks assessed                                 General Comments: arguing with wife      General Comments  Exercises     Assessment/Plan    PT Assessment Patient needs continued PT services  PT Problem List Cardiopulmonary status limiting activity       PT Treatment Interventions DME instruction;Gait training;Stair training;Functional mobility training;Therapeutic activities;Therapeutic exercise;Balance training    PT Goals (Current goals can be found in the Care Plan section)  Acute Rehab PT Goals Patient Stated Goal: home ASAP PT Goal Formulation: With patient Time For Goal Achievement: 08/11/17 Potential to Achieve Goals: Good Additional Goals Additional Goal #1: Pt to be indep in implementing energy  conservation strategies into ADLs and BADLs.    Frequency Min 2X/week   Barriers to discharge Decreased caregiver support home alone during the day but demo's safe mod I level of function and indep management of O2 cord    Co-evaluation               AM-PAC PT "6 Clicks" Daily Activity  Outcome Measure Difficulty turning over in bed (including adjusting bedclothes, sheets and blankets)?: None Difficulty moving from lying on back to sitting on the side of the bed? : None Difficulty sitting down on and standing up from a chair with arms (e.g., wheelchair, bedside commode, etc,.)?: A Little Help needed moving to and from a bed to chair (including a wheelchair)?: A Little Help needed walking in hospital room?: A Little Help needed climbing 3-5 steps with a railing? : A Little 6 Click Score: 20    End of Session Equipment Utilized During Treatment: Oxygen(4LO2 via [place]) Activity Tolerance: Patient tolerated treatment well;Treatment limited secondary to medical complications (Comment) Patient left: in chair;with call bell/phone within reach;with family/visitor present Nurse Communication: Mobility status PT Visit Diagnosis: Difficulty in walking, not elsewhere classified (R26.2)    Time: [id number] PT Time Calculation (min) (ACUTE ONLY): 22 min   Charges:   PT Evaluation $PT Eval Moderate Complexity: 1 Mod     PT G CodesLewis Shock:        Corde Antonini, PT, DPT Pager #: [phone] Office #: [phone]   Addy Mcmannis M Dewey Neukam 07/28/2017, 10:12 AM

## 2017-07-28 NOTE — Progress Notes (Signed)
#   1.  S/W LATASHA @ [facility] # [id number] OPT-2    1. ELIQUIS  50 MG BID   COVER- YES  CO-PAY- ZERO DOLLARS  PRIOR APPROVAL-NO   PATIENT : HAD MEDICAID [place]  EFF-DATE : 08-11-2013  CO-PAY- $ 3.70 FOR EACH RX   PREFERRED PHARMACY : [facility], [facility], [facility]

## 2017-07-28 NOTE — Progress Notes (Signed)
Patient being transferred to room 5W16 via wheelchair with belongings and CPAP machine.  Patient is alert and oriented to person, place, time and situation.

## 2017-07-31 NOTE — Telephone Encounter (Signed)
Referral has been placed. 

## 2017-08-05 ENCOUNTER — Encounter: Payer: Self-pay | Admitting: Internal Medicine

## 2017-08-05 ENCOUNTER — Ambulatory Visit (INDEPENDENT_AMBULATORY_CARE_PROVIDER_SITE_OTHER): Payer: Medicare HMO | Admitting: Internal Medicine

## 2017-08-05 VITALS — BP 128/60 | HR 77 | Ht 69.0 in | Wt 254.6 lb

## 2017-08-05 DIAGNOSIS — R69 Illness, unspecified: Secondary | ICD-10-CM | POA: Diagnosis not present

## 2017-08-05 DIAGNOSIS — J449 Chronic obstructive pulmonary disease, unspecified: Secondary | ICD-10-CM | POA: Diagnosis not present

## 2017-08-05 DIAGNOSIS — F17210 Nicotine dependence, cigarettes, uncomplicated: Secondary | ICD-10-CM

## 2017-08-05 MED ORDER — BUDESONIDE-FORMOTEROL FUMARATE 160-4.5 MCG/ACT IN AERO: 2.0000 | INHALATION_SPRAY | Freq: Two times a day (BID) | RESPIRATORY_TRACT | 6 refills | Status: DC

## 2017-08-05 MED ORDER — TIOTROPIUM BROMIDE MONOHYDRATE 18 MCG IN CAPS: 18.0000 ug | ORAL_CAPSULE | Freq: Every day | RESPIRATORY_TRACT | 6 refills | Status: DC

## 2017-08-05 NOTE — Progress Notes (Signed)
Subjective:    Patient ID: Chris [name], male    DOB: 1966/07/08   MRN: [id number]    Brief patient profile:  89 yobm active smoker with HIV  and doe x 2009 much worse since March 2014 so referred by [name] to pulmonary clinic 02/15/2013 with GOLD III copd by pfts 04/28/13.   History of Present Illness  02/15/2013 1st pulmonary eval  Cc indolent onset progressive doe x walking slow pace more than 100 ft,  not at rest., 02 dep, doe much worse x 3 months assoc with congested cough esp in am with sev tbsp thick white mucus takes about 30 min to clear.  No better on inhalers tried to date including advair and spiriva - albuterol works the best and using lots of saba and nebs rec Stop advair and spiriva Plan A = Automatic = Start symbicort 160 Take 2 puffs first thing in am and then another 2 puffs about 12 hours later.  Plan B = backup= Only use your albuterol (as a rescue medication to be used if you can't catch your breath by resting or doing a relaxed purse lip breathing pattern. The less you use it, the better it will work when you need it. Ok to use up to 2 puffs every 4 hours Plan C = nebulizer, use this only if plan B doesn't work ok to use up to 4 hours     04/28/2013 f/u ov/Chris [name] still smoking  re:  GOLD III/ 02 dep @ 2lpm  Chief Complaint  Patient presents with  . Follow-up    Pt states that his breathing has improved, symbicort seems to be helping some. No new co's today. Using proair approx 4 times per wk.   still doe but has learned to pace himself and overall better but somewhat limited from desired activities even on his best days rec Plan A = Automatic =  symbicort 160 Take 2 puffs first thing in am and then another 2 puffs about 12 hours later.  Add spiriva one capsule daily each am Plan B = backup= Only use your albuterol (proiare) as a rescue medication to be used if you can't catch your breath by resting or doing a relaxed purse lip breathing pattern. The less you use it, the  better it will work when you need it. Ok to use up to 2 puffs every 4 hours Plan C = nebulizer, use this only if plan B doesn't work after 15 min ok to use up to 4 hours   03/14/2014 Follow up and Medication review   rec Use medical med calendar        09/25/2016  Extended summary/ final f/u ov/Chris [name] re:  GOLD  III copd/ 02 2lpm prn / symb 160 2bid/spiriva  Chief Complaint  Patient presents with  . Follow-up    Breathing seems to be improving. He is using albuterol inhaler once per wk on average.   doe = MMRC2 = can't walk a nl pace on a flat grade s sob but does fine slow and flat eg does HT shopping ok on 2lpm  Rare need for saba hfa/ hardly ever needs neb now rec Plan A = Automatic =  symbicort 160 Take 2 puffs first thing in am and then another 2 puffs about 12 hours later.  Incruse one click each am - take two drags to get it all  Plan B = Backup Only use your albuterol as a rescue medication Plan C = Crisis - only use your albuterol nebulizer if you first try Plan B The key is to stop smoking completely before smoking completely stops you!    10/14/16   NP ov Rec Stop INCRUSE  Begin Spiriva Handihaler 1 puff daily .  Continue on Symbicort Twice daily. , brush rinse and gargle after use.  Important to quit smoking  Chest xray today .  Doxycycline '100mg'$  Twice daily  For 1 week , take with food.  Zyrtec '10mg'$  At bedtime  As needed  Drainage  Mucinex DM Twice daily As needed  Cough/congestion  Continue on O2 2l/m .     12/11/2016  f/u ov/Chris [name] re:  COPD  GOLD III/ symb / spiriva dpiand 02 2- lpm and still smoking Chief Complaint  Patient presents with  . Follow-up    Pt states his breathing seems worse on Incruse so he has started back on spiriva b/c he had some left over. He is using albuterol inhaler 2 x daily on average and neb 3 x per wk on average.   doe = MMRC2 = can't walk a nl pace on a flat grade s sob but does fine slow and flat  eg [employer] on  RA rec Change to spiriva respimat 2 pffs each am  The key is to stop smoking completely before smoking completely stops you!  Weight control is simply a matter of calorie balance    05/26/2017  Acute  ov/Chris [name] re:   COPD III/ symb/spiriva rare saba / 02 2pm hs then during day prn  Chief Complaint  Patient presents with  . Acute Visit    Increased DOE x 3 days.   most days gets up 6 am with sob/some congestion > symb x 2 and then spiriva and saba and maybe once a week with saba hfa / did not bring it with him  Then worse gradually x 3 days, waking up 4am not using rescue then  Not much cough-   does not bother sleep until 4 am but even then minimal vol of mucus/ white  Needing 02 2lpm 24/7 now  On PPI bid ac  rec Plan A = Automatic =  symbicort 160 Take 2 puffs first thing in am and then another 2 puffs about 12 hours later.                                       spiriva 2 puffs  Work on inhaler technique:   Plan B = Backup Only use your albuterol as a rescue medication Plan C = Crisis - only use your albuterol nebulizer if you first try Plan B The key is to stop smoking completely before smoking completely stops you!  Prednisone 10 mg take  4 each am x 2 days,   2 each am x 2 days,  1 each am x 2 days and stop  Please schedule a follow up visit in 3 months but call sooner if needed    Admit date: 07/21/2017 Discharge date: 07/28/2017  Time spent: 35 minutes  Recommendations for Outpatient Follow-up:  1. Repeat basic metabolic panel to follow electrolytes and renal function 2. Patient needs outpatient sleep study to sit up and determine candidacy for CPAP. 3. Reassess volume  status and blood pressure, adjust medications as needed. 4. Close follow-up to patient's CBG and A1c; further adjust hypoglycemic regimen as indicated.  Discharge Diagnoses:  Active Problems:   HIV disease ([place])   COPD  GOLD III, still smoking   Steroid-induced hyperglycemia   Chronic  respiratory failure with hypoxia and hypercapnia (HCC)   DM (diabetes mellitus) (HCC)   COPD exacerbation (HCC)   Obstructive sleep apnea syndrome   Essential hypertension, benign   Atopic eczema   Type 2 diabetes mellitus with complication (HCC)   Morbid (severe) obesity due to excess calories ([place])   COPD with acute exacerbation (HCC)   Depression   GERD (gastroesophageal reflux disease)   Acute on chronic respiratory failure with hypoxia ([place])        08/05/2017  Post hosp  f/u ov/Chris [name] re:  p copd exac - did not follow action plan reviewed at last ov prior to ER Chief Complaint  Patient presents with  . Follow-up    hospital f/u- [facility], D/C last week with COPD, using 3L [place], still has some SOB  with 02    -  MMRC2 = can't walk a nl pace on a flat grade s sob but does fine slow and flat  Using 02 as needed daytime and 2lpm hs   No obvious day to day or daytime variability or assoc excess/ purulent sputum or mucus plugs or hemoptysis or cp or chest tightness, subjective wheeze or overt sinus or hb symptoms. No unusual exposure hx or h/o childhood pna/ asthma or knowledge of premature birth.  Sleeping ok flat on 3lpm without nocturnal  or early am exacerbation  of respiratory  c/o's or need for noct saba. Also denies any obvious fluctuation of symptoms with weather or environmental changes or other aggravating or alleviating factors except as outlined above   Current Allergies, Complete Past Medical History, Past Surgical History, Family History, and Social History were reviewed in [employer] record.  ROS  The following are not active complaints unless bolded Hoarseness, sore throat, dysphagia, dental problems, itching, sneezing,  nasal congestion or discharge of excess mucus or purulent secretions, ear ache,   fever, chills, sweats, unintended wt loss or wt gain, classically pleuritic or exertional cp,  orthopnea pnd or leg swelling, presyncope, palpitations,  abdominal pain, anorexia, nausea, vomiting, diarrhea  or change in bowel habits or change in bladder habits, change in stools or change in urine, dysuria, hematuria,  rash, arthralgias, visual complaints, headache, numbness, weakness or ataxia or problems with walking or coordination,  change in mood/affect or memory.        Current Meds  Medication Sig  . ACCU-CHEK SOFTCLIX LANCETS lancets Use as instructed  . acetaminophen-codeine (TYLENOL #3) 300-30 MG tablet Take 1 tablet by mouth every 8 (eight) hours as needed for moderate pain.  [name] albuterol (PROVENTIL HFA;VENTOLIN HFA) 108 (90 Base) MCG/ACT inhaler Inhale 2 puffs into the lungs every 4 (four) hours as needed for wheezing or shortness of breath (((PLAN B))).  [name] apixaban (ELIQUIS) 5 MG TABS tablet Take 1 tablet (5 mg total) by mouth 2 (two) times daily.  [name] atorvastatin (LIPITOR) 20 MG tablet Take 1 tablet (20 mg total) by mouth daily.  . bictegravir-emtricitabine-tenofovir AF (BIKTARVY) 50-200-25 MG TABS tablet Take 1 tablet by mouth daily. (Patient taking differently: Take 1 tablet by mouth daily at 3 pm. )  . Blood Glucose Monitoring Suppl (ACCU-CHEK AVIVA PLUS) W/DEVICE KIT 250 Test up to 4 times daily  .  budesonide-formoterol (SYMBICORT) 160-4.5 MCG/ACT inhaler Take 2 puffs first thing in am and then another 2 puffs about 12 hours later. (Patient taking differently: Inhale 2 puffs into the lungs every 12 (twelve) hours. )  . clotrimazole (LOTRIMIN) 1 % cream Apply 1 application topically 2 (two) times daily.  [name] diltiazem (CARDIZEM CD) 360 MG 24 hr capsule Take 1 capsule (360 mg total) by mouth daily.  [name] doxycycline (VIBRA-TABS) 100 MG tablet Take 1 tablet (100 mg total) by mouth 2 (two) times daily.  . EZ SMART BLOOD GLUCOSE LANCETS MISC 1 Units by Does not apply route 4 (four) times daily.  . furosemide (LASIX) 20 MG tablet Take 1 tablet (20 mg total) by mouth daily.  [name] glucose blood test strip Use as instructed  . insulin glargine  (LANTUS) 100 UNIT/ML injection Inject 0.3 mLs (30 Units total) into the skin every morning.  . Insulin Pen Needle 32G X 5 MM MISC Use as directed  . Insulin Syringe-Needle U-100 (BD INSULIN SYRINGE ULTRAFINE) 31G X 15/64" 0.5 ML MISC Use as directed  . ipratropium-albuterol (DUONEB) 0.5-2.5 (3) MG/3ML SOLN Take 3 mLs by nebulization every 6 (six) hours as needed. (Patient taking differently: Take 3 mLs by nebulization every 6 (six) hours as needed (wheezing/shortness of breath). )  . metFORMIN (GLUCOPHAGE) 1000 MG tablet Take 1 tablet (1,000 mg total) by mouth 2 (two) times daily with a meal.  . OXYGEN Place 2 L into the nose as needed (shortness of breath).   . pantoprazole (PROTONIX) 40 MG tablet Take 1 tablet (40 mg total) by mouth 2 (two) times daily before a meal.  . predniSONE (DELTASONE) 20 MG tablet Take 2 tablet by mouth daily times 2 days; then 1 tablet by mouth daily times 3 days; then 1/2 tablet by mouth daily times 3 days and stop prednisone.  .   Tiotropium Bromide  SMI Inhale 2 puffs into the lungs daily.                       Objective:   Physical Exam   amb  Obese bm slt cushingnoid  Vital signs reviewed - Note on arrival 02 sats  95% on 3lpm NP      01/05/2015       278 >  02/29/2016  274 > 06/17/2016 277 > 09/25/2016   258  > 12/11/2016   270  > 05/26/2017  270 > 08/05/2017  255  01/28/2016        272   05/16/14 285 lb 6.4 oz (129.457 kg)  04/20/14 282 lb 12.8 oz (128.277 kg)  03/14/14 274 lb 3.2 oz (124.376 kg)        HEENT: nl dentition, turbinates bilaterally, and oropharynx. Nl external ear canals without cough reflex   NECK :  without JVD/Nodes/TM/ nl carotid upstrokes bilaterally   LUNGS: no acc muscle use,  Nl contour chest which is clear to A and P bilaterally without cough on insp or exp maneuvers   CV:  RRR  no s3 or murmur or increase in P2, and no edema   ABD: obese/ soft and nontender with nl inspiratory excursion in the supine position. No  bruits or organomegaly appreciated, bowel sounds nl  MS:  Nl gait/ ext warm without deformities, calf tenderness, cyanosis or clubbing No obvious joint restrictions   SKIN: warm and dry without lesions    NEURO:  alert, approp, nl sensorium with  no motor or cerebellar deficits apparent.  I personally reviewed images and agree with radiology impression as follows:   Chest CT  07/23/17 No evidence of pulmonary embolus.  Mild emphysema.  No acute findings.    Assessment & Plan:

## 2017-08-05 NOTE — Patient Instructions (Addendum)
Work on inhaler technique:  relax and gently blow all the way out then take a nice smooth deep breath back in, triggering the inhaler at same time you start breathing in.  Hold for up to 5 seconds if you can. Blow out thru nose. Rinse and gargle with water when done    Plan A = Automatic =  symbicort 160 Take 2 puffs first thing in am and then another 2 puffs about 12 hours later. spiriva 2 puffs in am only     Plan B = Backup Only use your albuterol as a rescue medication to be used if you can't catch your breath by resting or doing a relaxed purse lip breathing pattern.  - The less you use it, the better it will work when you need it. - Ok to use the inhaler up to 2 puffs  every 4 hours if you must but call for appointment if use goes up over your usual need - Don't leave home without it !!  (think of it like the spare tire for your car)   Plan C = Crisis - only use your albuterol nebulizer if you first try Plan B and it fails to help > ok to use the nebulizer up to every 4 hours but if start needing it regularly call for immediate appointment  The key is to stop smoking completely before smoking completely stops you!    Keep your follow up appt but bring all inhalers and nebulizer solutions with you to this visit

## 2017-08-06 ENCOUNTER — Telehealth: Payer: Self-pay | Admitting: Internal Medicine

## 2017-08-07 NOTE — Telephone Encounter (Signed)
error 

## 2017-08-08 ENCOUNTER — Other Ambulatory Visit: Payer: Self-pay | Admitting: Internal Medicine

## 2017-08-12 ENCOUNTER — Encounter: Payer: Self-pay | Admitting: Internal Medicine

## 2017-08-12 NOTE — Assessment & Plan Note (Signed)
>   3 m  Matter of life or breath discussion, still not willing to commit to completely stop smoking before smoking completely stops him

## 2017-08-12 NOTE — Assessment & Plan Note (Addendum)
- 04/28/2013 PFT's FEV1 1.21 (39%) ratio 59 and 13% better p B2 and dlco 72 corrects to 102  - started spiriva 04/30/2013 > changed to respimat 02/28/2014 -Med calendar 03/14/2014 > not using as of 05/16/14  - arrived on stiolto / symbicort 02/29/2016 > changed to symbicort 80/spiriva (lower dose ICS due to interaction with aids meds  - PFT's  06/17/2016  FEV1 1.29 (42 % ) ratio 64  p 43 % improvement from saba p symb 80 /spiriva prior to study with DLCO  51/49c % corrects to 86  % for alv volume   - 06/17/2016   change symb to 160 2bid  - 09/25/2016  After extensive coaching device  effectiveness =    90% with dpi/ elipta > changed to Incruse per Insurance restrictions > preferred spiriva - 12/11/2016   changed to spiriva respimat  05/26/2017  After extensive coaching HFA effectiveness =  90%      DDX of  difficult airways management almost all start with A and  include Adherence, Ace Inhibitors, Acid Reflux, Active Sinus Disease, Alpha 1 Antitripsin deficiency, Anxiety masquerading as Airways dz,  ABPA,  Allergy(esp in young), Aspiration (esp in elderly), Adverse effects of meds,  Active smokers, A bunch of PE's (a small clot burden can't cause this syndrome unless there is already severe underlying pulm or vascular dz with poor reserve) plus two Bs  = Bronchiectasis and Beta blocker use..and one C= CHF   Adherence is always the initial "prime suspect" and is a multilayered concern that requires a "trust but verify" approach in every patient - starting with knowing how to use medications, especially inhalers, correctly, keeping up with refills and understanding the fundamental difference between maintenance and prns vs those medications only taken for a very short course and then stopped and not refilled.  - return with all meds in hand using a trust but verify approach to confirm accurate Medication  Reconciliation The principal here is that until we are certain that the  patients are doing what we've asked,  it makes no sense to ask them to do more.  - 08/05/2017  After extensive coaching inhaler device  effectiveness =    90%   Active smoking top of the usual lsit of suspects   ? Acid (or non-acid) GERD > always difficult to exclude as up to 75% of pts in some series report no assoc GI/ Heartburn symptoms> rec continue max (24h)  acid suppression and diet restrictions/ reviewed     ? Allergy / asthma component > continue high dose ICS and prn pred   ? A bunch of PE's > neg CTa 07/23/17 / on eliquis  ? CHF  BNP on admit only 31 so this dx is excluded    Main concerns are adherence/ smoking  I had an extended discussion with the patient reviewing all relevant studies completed to date and  lasting 15 to 20 minutes of a 25 minute visit on the following ongoing concerns:   Formulary restrictions will be an ongoing challenge for the forseable future and I would be happy to pick an alternative if the pt will first  provide me a list of them but pt  will need to return here for training for any new device that is required eg dpi vs hfa vs respimat.    In meantime we can always provide samples so the patient never runs out of any needed respiratory medications.    ABC plan reviewed again line by  line and he appeared to understand it (though this was also previously the case)  Each maintenance medication was reviewed in detail including most importantly the difference between maintenance and as needed and under what circumstances the prns are to be used.  Please see AVS for specific  Instructions which are unique to this visit and I personally typed out  which were reviewed in detail in writing with the patient and a copy provided.

## 2017-08-13 NOTE — Telephone Encounter (Signed)
Patient called and requested for his tylenol 3 to be refilled. Patient stated that he no longer has any pills left.

## 2017-08-13 NOTE — Telephone Encounter (Signed)
Will forward to Dr. Alvis LemmingsNewlin (Dr. Venetia NightAmao) who is covering for Dr. Hyman HopesJegede

## 2017-08-14 ENCOUNTER — Encounter: Payer: Self-pay | Admitting: *Deleted

## 2017-08-14 MED ORDER — ACETAMINOPHEN-CODEINE #3 300-30 MG PO TABS: 1.0000 | ORAL_TABLET | Freq: Three times a day (TID) | ORAL | 0 refills | Status: DC | PRN

## 2017-08-14 NOTE — Telephone Encounter (Signed)
Pt was called and informed of script being ready. 

## 2017-08-14 NOTE — Telephone Encounter (Signed)
Ready for pick up

## 2017-08-14 NOTE — Progress Notes (Signed)
Patients sleep study was approved from Jan 3rd to April 3RD, 2019 through [employer].

## 2017-08-16 DIAGNOSIS — J129 Viral pneumonia, unspecified: Secondary | ICD-10-CM | POA: Diagnosis not present

## 2017-08-16 DIAGNOSIS — J449 Chronic obstructive pulmonary disease, unspecified: Secondary | ICD-10-CM | POA: Diagnosis not present

## 2017-08-20 ENCOUNTER — Ambulatory Visit: Payer: Medicaid Other | Admitting: Internal Medicine

## 2017-08-27 ENCOUNTER — Ambulatory Visit: Payer: Medicaid Other | Admitting: Internal Medicine

## 2017-08-28 ENCOUNTER — Ambulatory Visit: Payer: Medicaid Other | Admitting: Internal Medicine

## 2017-09-02 ENCOUNTER — Ambulatory Visit: Payer: Medicare HMO | Attending: Internal Medicine | Admitting: Internal Medicine

## 2017-09-02 ENCOUNTER — Encounter: Payer: Self-pay | Admitting: Internal Medicine

## 2017-09-02 VITALS — BP 130/71 | HR 82 | Temp 98.7°F | Resp 18 | Ht 69.0 in | Wt 259.0 lb

## 2017-09-02 DIAGNOSIS — J440 Chronic obstructive pulmonary disease with acute lower respiratory infection: Secondary | ICD-10-CM | POA: Diagnosis not present

## 2017-09-02 DIAGNOSIS — Z6838 Body mass index (BMI) 38.0-38.9, adult: Secondary | ICD-10-CM | POA: Diagnosis not present

## 2017-09-02 DIAGNOSIS — Z7901 Long term (current) use of anticoagulants: Secondary | ICD-10-CM | POA: Insufficient documentation

## 2017-09-02 DIAGNOSIS — K219 Gastro-esophageal reflux disease without esophagitis: Secondary | ICD-10-CM | POA: Diagnosis not present

## 2017-09-02 DIAGNOSIS — Z794 Long term (current) use of insulin: Secondary | ICD-10-CM

## 2017-09-02 DIAGNOSIS — Z720 Tobacco use: Secondary | ICD-10-CM | POA: Diagnosis not present

## 2017-09-02 DIAGNOSIS — M1711 Unilateral primary osteoarthritis, right knee: Secondary | ICD-10-CM | POA: Insufficient documentation

## 2017-09-02 DIAGNOSIS — Z7951 Long term (current) use of inhaled steroids: Secondary | ICD-10-CM | POA: Insufficient documentation

## 2017-09-02 DIAGNOSIS — J449 Chronic obstructive pulmonary disease, unspecified: Secondary | ICD-10-CM | POA: Diagnosis not present

## 2017-09-02 DIAGNOSIS — F4321 Adjustment disorder with depressed mood: Secondary | ICD-10-CM | POA: Insufficient documentation

## 2017-09-02 DIAGNOSIS — G4733 Obstructive sleep apnea (adult) (pediatric): Secondary | ICD-10-CM | POA: Diagnosis not present

## 2017-09-02 DIAGNOSIS — F17210 Nicotine dependence, cigarettes, uncomplicated: Secondary | ICD-10-CM | POA: Diagnosis not present

## 2017-09-02 DIAGNOSIS — E1165 Type 2 diabetes mellitus with hyperglycemia: Secondary | ICD-10-CM | POA: Diagnosis not present

## 2017-09-02 DIAGNOSIS — E11628 Type 2 diabetes mellitus with other skin complications: Secondary | ICD-10-CM

## 2017-09-02 DIAGNOSIS — I4891 Unspecified atrial fibrillation: Secondary | ICD-10-CM | POA: Insufficient documentation

## 2017-09-02 DIAGNOSIS — E118 Type 2 diabetes mellitus with unspecified complications: Secondary | ICD-10-CM | POA: Diagnosis not present

## 2017-09-02 DIAGNOSIS — R69 Illness, unspecified: Secondary | ICD-10-CM | POA: Diagnosis not present

## 2017-09-02 DIAGNOSIS — Z79899 Other long term (current) drug therapy: Secondary | ICD-10-CM | POA: Diagnosis not present

## 2017-09-02 DIAGNOSIS — Z882 Allergy status to sulfonamides status: Secondary | ICD-10-CM | POA: Insufficient documentation

## 2017-09-02 DIAGNOSIS — I10 Essential (primary) hypertension: Secondary | ICD-10-CM | POA: Insufficient documentation

## 2017-09-02 LAB — POCT GLYCOSYLATED HEMOGLOBIN (HGB A1C): HEMOGLOBIN A1C: 10.5

## 2017-09-02 LAB — GLUCOSE, POCT (MANUAL RESULT ENTRY): POC Glucose: 348 mg/dl — AB (ref 70–99)

## 2017-09-02 MED ORDER — IPRATROPIUM-ALBUTEROL 0.5-2.5 (3) MG/3ML IN SOLN: 3.0000 mL | Freq: Four times a day (QID) | RESPIRATORY_TRACT | 3 refills | Status: DC | PRN

## 2017-09-02 MED ORDER — METFORMIN HCL 1000 MG PO TABS: 1000.0000 mg | ORAL_TABLET | Freq: Two times a day (BID) | ORAL | 3 refills | Status: DC

## 2017-09-02 MED ORDER — ATORVASTATIN CALCIUM 20 MG PO TABS: 20.0000 mg | ORAL_TABLET | Freq: Every day | ORAL | 3 refills | Status: DC

## 2017-09-02 MED ORDER — INSULIN GLARGINE 100 UNIT/ML ~~LOC~~ SOLN: 35.0000 [IU] | Freq: Every morning | SUBCUTANEOUS | 3 refills | Status: DC

## 2017-09-02 MED ORDER — APIXABAN 5 MG PO TABS: 5.0000 mg | ORAL_TABLET | Freq: Two times a day (BID) | ORAL | 3 refills | Status: DC

## 2017-09-02 MED ORDER — ACETAMINOPHEN-CODEINE #3 300-30 MG PO TABS
1.0000 | ORAL_TABLET | Freq: Three times a day (TID) | ORAL | 0 refills | Status: DC | PRN
Start: 2017-09-02 — End: 2017-09-02

## 2017-09-02 MED ORDER — FUROSEMIDE 20 MG PO TABS: 20.0000 mg | ORAL_TABLET | Freq: Every day | ORAL | 1 refills | Status: DC

## 2017-09-02 MED ORDER — DILTIAZEM HCL ER COATED BEADS 360 MG PO CP24: 360.0000 mg | ORAL_CAPSULE | Freq: Every day | ORAL | 1 refills | Status: DC

## 2017-09-02 MED ORDER — PANTOPRAZOLE SODIUM 40 MG PO TBEC: 40.0000 mg | DELAYED_RELEASE_TABLET | Freq: Two times a day (BID) | ORAL | 3 refills | Status: DC

## 2017-09-02 MED ORDER — ACETAMINOPHEN-CODEINE #3 300-30 MG PO TABS: 1.0000 | ORAL_TABLET | Freq: Three times a day (TID) | ORAL | 0 refills | Status: DC | PRN

## 2017-09-02 MED ORDER — ALBUTEROL SULFATE HFA 108 (90 BASE) MCG/ACT IN AERS: 2.0000 | INHALATION_SPRAY | RESPIRATORY_TRACT | 2 refills | Status: DC | PRN

## 2017-09-02 MED ORDER — APIXABAN 5 MG PO TABS: 5.0000 mg | ORAL_TABLET | Freq: Two times a day (BID) | ORAL | 1 refills | Status: DC

## 2017-09-02 MED ORDER — BUDESONIDE-FORMOTEROL FUMARATE 160-4.5 MCG/ACT IN AERO: 2.0000 | INHALATION_SPRAY | Freq: Two times a day (BID) | RESPIRATORY_TRACT | 6 refills | Status: DC

## 2017-09-02 NOTE — Patient Instructions (Signed)
Chronic Obstructive Pulmonary Disease Chronic obstructive pulmonary disease (COPD) is a long-term (chronic) lung problem. When you have COPD, it is hard for air to get in and out of your lungs. The way your lungs work will never return to normal. Usually the condition gets worse over time. There are things you can do to keep yourself as healthy as possible. Your doctor may treat your condition with:  Medicines.  Quitting smoking, if you smoke.  Rehabilitation. This may involve a team of specialists.  Oxygen.  Exercise and changes to your diet.  Lung surgery.  Comfort measures (palliative care).  Follow these instructions at home: Medicines  Take over-the-counter and prescription medicines only as told by your doctor.  Talk to your doctor before taking any cough or allergy medicines. You may need to avoid medicines that cause your lungs to be dry. Lifestyle  If you smoke, stop. Smoking makes the problem worse. If you need help quitting, ask your doctor.  Avoid being around things that make your breathing worse. This may include smoke, chemicals, and fumes.  Stay active, but remember to also rest.  Learn and use tips on how to relax.  Make sure you get enough sleep. Most adults need at least 7 hours a night.  Eat healthy foods. Eat smaller meals more often. Rest before meals. Controlled breathing  Learn and use tips on how to control your breathing as told by your doctor. Try: ? Breathing in (inhaling) through your nose for 1 second. Then, pucker your lips and breath out (exhale) through your lips for 2 seconds. ? Putting one hand on your belly (abdomen). Breathe in slowly through your nose for 1 second. Your hand on your belly should move out. Pucker your lips and breathe out slowly through your lips. Your hand on your belly should move in as you breathe out. Controlled coughing  Learn and use controlled coughing to clear mucus from your lungs. The steps are: 1. Lean your  head a little forward. 2. Breathe in deeply. 3. Try to hold your breath for 3 seconds. 4. Keep your mouth slightly open while coughing 2 times. 5. Spit any mucus out into a tissue. 6. Rest and do the steps again 1 or 2 times as needed. General instructions  Make sure you get all the shots (vaccines) that your doctor recommends. Ask your doctor about a flu shot and a pneumonia shot.  Use oxygen therapy and therapy to help improve your lungs (pulmonary rehabilitation) if told by your doctor. If you need home oxygen therapy, ask your doctor if you should buy a tool to measure your oxygen level (oximeter).  Make a COPD action plan with your doctor. This helps you know what to do if you feel worse than usual.  Manage any other conditions you have as told by your doctor.  Avoid going outside when it is very hot, cold, or humid.  Avoid people who have a sickness you can catch (contagious).  Keep all follow-up visits as told by your doctor. This is important. Contact a doctor if:  You cough up more mucus than usual.  There is a change in the color or thickness of the mucus.  It is harder to breathe than usual.  Your breathing is faster than usual.  You have trouble sleeping.  You need to use your medicines more often than usual.  You have trouble doing your normal activities such as getting dressed or walking around the house. Get help right away if:    You have shortness of breath while resting.  You have shortness of breath that stops you from: ? Being able to talk. ? Doing normal activities.  Your chest hurts for longer than 5 minutes.  Your skin color is more blue than usual.  Your pulse oximeter shows that you have low oxygen for longer than 5 minutes.  You have a fever.  You feel too tired to breathe normally. Summary  Chronic obstructive pulmonary disease (COPD) is a long-term lung problem.  The way your lungs work will never return to normal. Usually the  condition gets worse over time. There are things you can do to keep yourself as healthy as possible.  Take over-the-counter and prescription medicines only as told by your doctor.  If you smoke, stop. Smoking makes the problem worse. This information is not intended to replace advice given to you by your health care provider. Make sure you discuss any questions you have with your health care provider. Document Released: 01/14/2008 Document Revised: 01/03/2016 Document Reviewed: 03/24/2013 Elsevier Interactive Patient Education  2017 [employer]. Atrial Fibrillation Atrial fibrillation is a type of heartbeat that is irregular or fast (rapid). If you have this condition, your heart keeps quivering in a weird (chaotic) way. This condition can make it so your heart cannot pump blood normally. Having this condition gives a person more risk for stroke, heart failure, and other heart problems. There are different types of atrial fibrillation. Talk with your doctor to learn about the type that you have. Follow these instructions at home:  Take over-the-counter and prescription medicines only as told by your doctor.  If your doctor prescribed a blood-thinning medicine, take it exactly as told. Taking too much of it can cause bleeding. If you do not take enough of it, you will not have the protection that you need against stroke and other problems.  Do not use any tobacco products. These include cigarettes, chewing tobacco, and e-cigarettes. If you need help quitting, ask your doctor.  If you have apnea (obstructive sleep apnea), manage it as told by your doctor.  Do not drink alcohol.  Do not drink beverages that have caffeine. These include coffee, soda, and tea.  Maintain a healthy weight. Do not use diet pills unless your doctor says they are safe for you. Diet pills may make heart problems worse.  Follow diet instructions as told by your doctor.  Exercise regularly as told by your  doctor.  Keep all follow-up visits as told by your doctor. This is important. Contact a doctor if:  You notice a change in the speed, rhythm, or strength of your heartbeat.  You are taking a blood-thinning medicine and you notice more bruising.  You get tired more easily when you move or exercise. Get help right away if:  You have pain in your chest or your belly (abdomen).  You have sweating or weakness.  You feel sick to your stomach (nauseous).  You notice blood in your throw up (vomit), poop (stool), or pee (urine).  You are short of breath.  You suddenly have swollen feet and ankles.  You feel dizzy.  Your suddenly get weak or numb in your face, arms, or legs, especially if it happens on one side of your body.  You have trouble talking, trouble understanding, or both.  Your face or your eyelid droops on one side. These symptoms may be an emergency. Do not wait to see if the symptoms will go away. Get medical help right away.  Call your local emergency services (911 in the [place] Do not drive yourself to the hospital. This information is not intended to replace advice given to you by your health care provider. Make sure you discuss any questions you have with your health care provider. Document Released: 05/06/2008 Document Revised: 01/03/2016 Document Reviewed: 11/22/2014 Elsevier Interactive Patient Education  2018 [employer]. Diabetes Mellitus and Standards of Medical Care Managing diabetes (diabetes mellitus) can be complicated. Your diabetes treatment may be managed by a team of health care providers, including:  A diet and nutrition specialist (registered dietitian).  A nurse.  A certified diabetes educator (CDE).  A diabetes specialist (endocrinologist).  An eye doctor.  A primary care provider.  A dentist.  Your health care providers follow a schedule in order to help you get the best quality of care. The following schedule is a general guideline for  your diabetes management plan. Your health care providers may also give you more specific instructions. HbA1c ( hemoglobin A1c) test This test provides information about blood sugar (glucose) control over the previous 2-3 months. It is used to check whether your diabetes management plan needs to be adjusted.  If you are meeting your treatment goals, this test is done at least 2 times a year.  If you are not meeting treatment goals or if your treatment goals have changed, this test is done 4 times a year.  Blood pressure test  This test is done at every routine medical visit. For most people, the goal is less than 130/80. Ask your health care provider what your goal blood pressure should be. Dental and eye exams  Visit your dentist two times a year.  If you have type 1 diabetes, get an eye exam 3-5 years after you are diagnosed, and then once a year after your first exam. ? If you were diagnosed with type 1 diabetes as a child, get an eye exam when you are age 57 or older and have had diabetes for 3-5 years. After the first exam, you should get an eye exam once a year.  If you have type 2 diabetes, have an eye exam as soon as you are diagnosed, and then once a year after your first exam. Foot care exam  Visual foot exams are done at every routine medical visit. The exams check for cuts, bruises, redness, blisters, sores, or other problems with the feet.  A complete foot exam is done by your health care provider once a year. This exam includes an inspection of the structure and skin of your feet, and a check of the pulses and sensation in your feet. ? Type 1 diabetes: Get your first exam 3-5 years after diagnosis. ? Type 2 diabetes: Get your first exam as soon as you are diagnosed.  Check your feet every day for cuts, bruises, redness, blisters, or sores. If you have any of these or other problems that are not healing, contact your health care provider. Kidney function test ( urine  microalbumin)  This test is done once a year. ? Type 1 diabetes: Get your first test 5 years after diagnosis. ? Type 2 diabetes: Get your first test as soon as you are diagnosed.  If you have chronic kidney disease (CKD), get a serum creatinine and estimated glomerular filtration rate (eGFR) test once a year. Lipid profile (cholesterol, HDL, LDL, triglycerides)  This test should be done when you are diagnosed with diabetes, and every 5 years after the first test. If you are  on medicines to lower your cholesterol, you may need to get this test done every year. ? The goal for LDL is less than 100 mg/dL (5.5 mmol/L). If you are at high risk, the goal is less than 70 mg/dL (3.9 mmol/L). ? The goal for HDL is 40 mg/dL (2.2 mmol/L) for men and 50 mg/dL(2.8 mmol/L) for women. An HDL cholesterol of 60 mg/dL (3.3 mmol/L) or higher gives some protection against heart disease. ? The goal for triglycerides is less than 150 mg/dL (8.3 mmol/L). Immunizations  The yearly flu (influenza) vaccine is recommended for everyone 6 months or older who has diabetes.  The pneumonia (pneumococcal) vaccine is recommended for everyone 2 years or older who has diabetes. If you are 42 or older, you may get the pneumonia vaccine as a series of two separate shots.  The hepatitis B vaccine is recommended for adults shortly after they have been diagnosed with diabetes.  The Tdap (tetanus, diphtheria, and pertussis) vaccine should be given: ? According to normal childhood vaccination schedules, for children. ? Every 10 years, for adults who have diabetes.  The shingles vaccine is recommended for people who have had chicken pox and are 50 years or older. Mental and emotional health  Screening for symptoms of eating disorders, anxiety, and depression is recommended at the time of diagnosis and afterward as needed. If your screening shows that you have symptoms (you have a positive screening result), you may need further  evaluation and be referred to a mental health care provider. Diabetes self-management education  Education about how to manage your diabetes is recommended at diagnosis and ongoing as needed. Treatment plan  Your treatment plan will be reviewed at every medical visit. Summary  Managing diabetes (diabetes mellitus) can be complicated. Your diabetes treatment may be managed by a team of health care providers.  Your health care providers follow a schedule in order to help you get the best quality of care.  Standards of care including having regular physical exams, blood tests, blood pressure monitoring, immunizations, screening tests, and education about how to manage your diabetes.  Your health care providers may also give you more specific instructions based on your individual health. This information is not intended to replace advice given to you by your health care provider. Make sure you discuss any questions you have with your health care provider. Document Released: 05/25/2009 Document Revised: 04/25/2016 Document Reviewed: 04/25/2016 Elsevier Interactive Patient Education  [employer].
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
## 2017-09-03 ENCOUNTER — Other Ambulatory Visit: Payer: Self-pay | Admitting: Pharmacist

## 2017-09-03 ENCOUNTER — Ambulatory Visit: Payer: Medicaid Other | Admitting: Internal Medicine

## 2017-09-03 MED ORDER — INSULIN PEN NEEDLE 31G X 5 MM MISC: 5 refills | Status: DC

## 2017-09-03 MED ORDER — BASAGLAR KWIKPEN 100 UNIT/ML ~~LOC~~ SOPN: 35.0000 [IU] | PEN_INJECTOR | Freq: Every day | SUBCUTANEOUS | 2 refills | Status: DC

## 2017-09-10 ENCOUNTER — Encounter: Payer: Self-pay | Admitting: *Deleted

## 2017-09-10 ENCOUNTER — Ambulatory Visit: Payer: Medicaid Other | Admitting: Internal Medicine

## 2017-09-16 DIAGNOSIS — J449 Chronic obstructive pulmonary disease, unspecified: Secondary | ICD-10-CM | POA: Diagnosis not present

## 2017-09-16 DIAGNOSIS — J129 Viral pneumonia, unspecified: Secondary | ICD-10-CM | POA: Diagnosis not present

## 2017-09-21 ENCOUNTER — Encounter: Payer: Self-pay | Admitting: Podiatry

## 2017-09-21 ENCOUNTER — Ambulatory Visit (INDEPENDENT_AMBULATORY_CARE_PROVIDER_SITE_OTHER): Payer: Medicare HMO | Admitting: Podiatry

## 2017-09-21 DIAGNOSIS — Z79899 Other long term (current) drug therapy: Secondary | ICD-10-CM

## 2017-09-21 DIAGNOSIS — E0843 Diabetes mellitus due to underlying condition with diabetic autonomic (poly)neuropathy: Secondary | ICD-10-CM

## 2017-09-21 DIAGNOSIS — M79676 Pain in unspecified toe(s): Secondary | ICD-10-CM | POA: Diagnosis not present

## 2017-09-21 DIAGNOSIS — L603 Nail dystrophy: Secondary | ICD-10-CM | POA: Diagnosis not present

## 2017-09-21 DIAGNOSIS — B351 Tinea unguium: Secondary | ICD-10-CM | POA: Diagnosis not present

## 2017-09-21 DIAGNOSIS — B353 Tinea pedis: Secondary | ICD-10-CM

## 2017-09-21 DIAGNOSIS — L989 Disorder of the skin and subcutaneous tissue, unspecified: Secondary | ICD-10-CM | POA: Diagnosis not present

## 2017-09-21 MED ORDER — TERBINAFINE HCL 250 MG PO TABS: 250.0000 mg | ORAL_TABLET | Freq: Every day | ORAL | 0 refills | Status: DC

## 2017-09-21 MED ORDER — CLOTRIMAZOLE-BETAMETHASONE 1-0.05 % EX CREA: 1.0000 "application " | TOPICAL_CREAM | Freq: Two times a day (BID) | CUTANEOUS | 3 refills | Status: DC

## 2017-09-22 ENCOUNTER — Ambulatory Visit (INDEPENDENT_AMBULATORY_CARE_PROVIDER_SITE_OTHER): Payer: Medicare HMO | Admitting: Internal Medicine

## 2017-09-22 ENCOUNTER — Encounter: Payer: Self-pay | Admitting: Internal Medicine

## 2017-09-22 VITALS — BP 128/82 | HR 83 | Ht 69.0 in | Wt 262.0 lb

## 2017-09-22 DIAGNOSIS — J9611 Chronic respiratory failure with hypoxia: Secondary | ICD-10-CM | POA: Diagnosis not present

## 2017-09-22 DIAGNOSIS — R079 Chest pain, unspecified: Secondary | ICD-10-CM

## 2017-09-22 DIAGNOSIS — F17210 Nicotine dependence, cigarettes, uncomplicated: Secondary | ICD-10-CM

## 2017-09-22 DIAGNOSIS — J449 Chronic obstructive pulmonary disease, unspecified: Secondary | ICD-10-CM | POA: Diagnosis not present

## 2017-09-22 DIAGNOSIS — B370 Candidal stomatitis: Secondary | ICD-10-CM | POA: Diagnosis not present

## 2017-09-22 DIAGNOSIS — J9612 Chronic respiratory failure with hypercapnia: Secondary | ICD-10-CM | POA: Diagnosis not present

## 2017-09-22 DIAGNOSIS — R69 Illness, unspecified: Secondary | ICD-10-CM | POA: Diagnosis not present

## 2017-09-22 MED ORDER — BUDESONIDE-FORMOTEROL FUMARATE 80-4.5 MCG/ACT IN AERO: INHALATION_SPRAY | RESPIRATORY_TRACT | 11 refills | Status: DC

## 2017-09-22 MED ORDER — CLOTRIMAZOLE 10 MG MT TROC: 10.0000 mg | Freq: Every day | OROMUCOSAL | 5 refills | Status: DC

## 2017-09-22 NOTE — Patient Instructions (Addendum)
The key is to stop smoking completely before smoking completely stops you!   Ok to try symb 80 Take 2 puffs first thing in am and then another 2 puffs about 12 hours later to see if breathing is as good  For thrush> clotrimazole troche 10 mg up to 4 x daily as needed   Please schedule a follow up visit in 6 months but call sooner if needed - PFTs on return

## 2017-09-22 NOTE — Progress Notes (Signed)
    Subjective: Patient is a 52 y.o. male presenting to the office today with a chief complaint of painful callus lesions to the bilateral great toes that have been present for the last few weeks. He reports associated dry skin to the plantar aspects of his heels bilaterally. He has not done anything to treat his symptoms. There are no modifying factors noted.   Patient also complains of elongated, thickened nails that cause pain while ambulating in shoes. Patient is unable to trim their own nails. Patient presents today for further treatment and evaluation.   Past Medical History:  Diagnosis Date  . COPD (chronic obstructive pulmonary disease) (HCC)   . Depression   . Diabetes mellitus without complication ([facility])   . Dyspnea   . Emphysema   . GERD (gastroesophageal reflux disease)   . HIV disease ([facility])     Objective:  Physical Exam General: Alert and oriented x3 in no acute distress  Dermatology: Hyperkeratotic lesions present on the bilateral great toes. Pain on palpation with a central nucleated core noted. Skin is warm, dry and supple bilateral lower extremities. Negative for open lesions or macerations. Nails are tender, long, thickened and dystrophic with subungual debris, consistent with onychomycosis, 1-5 bilateral. No signs of infection noted. Pruritus to bilateral feet with hyperkeratosis. Hyperkeratotic, discolored, thickened, onychodystrophy of nails noted bilaterally.   Vascular: Palpable pedal pulses bilaterally. No edema or erythema noted. Capillary refill within normal limits.  Neurological: Epicritic and protective threshold grossly intact bilaterally.   Musculoskeletal Exam: Pain on palpation at the keratotic lesion noted. Range of motion within normal limits bilateral. Muscle strength 5/5 in all groups bilateral.  Assessment: 1. Onychodystrophic nails 1-5 bilateral with hyperkeratosis of nails.  2. Onychomycosis of nail due to dermatophyte bilateral 3.  Pre-ulcerative callus lesions to the bilateral great toes 4. Tinea pedis bilateral feet 5. Onychomycosis nails 1-5 bilateral   Plan of Care:  #1 Patient evaluated. #2 Excisional debridement of keratoic lesion using a chisel blade was performed without incident.  #3 Dressed with light dressing. #4 Mechanical debridement of nails 1-5 bilaterally performed using a nail nipper. Filed with dremel without incident.  #5 Today a nail biopsy of the left 2nd toe was taken and sent to pathology for fungal culture. #6 Prescription for Lamisil 250 mg #90 provided to patient.  #7 Prescription for Lotrisone cream provided to patient.  #8 Orders for liver function test placed. #9 Return to clinic in 3 months.    Felecia ShellingBrent M. Guillaume Weninger, DPM [facility]  Dr. Felecia ShellingBrent M. Wiliam Cauthorn, DPM    [address]                                        [place], [place] [address]                Office [phone]  Fax [phone]

## 2017-09-22 NOTE — Progress Notes (Signed)
Subjective:    Patient ID: Chris [name], male    DOB: [birth year]   MRN: [id number]    Brief patient profile:  78 yobm active smoker with HIV  and doe x 2009 much worse since March 2014 so referred by [name] to pulmonary clinic 02/15/2013 with GOLD III copd by pfts 04/28/13.     History of Present Illness  02/15/2013 1st pulmonary eval  Cc indolent onset progressive doe x walking slow pace more than 100 ft,  not at rest., 02 dep, doe much worse x 3 months assoc with congested cough esp in am with sev tbsp thick white mucus takes about 30 min to clear.  No better on inhalers tried to date including advair and spiriva - albuterol works the best and using lots of saba and nebs rec Stop advair and spiriva Plan A = Automatic = Start symbicort 160 Take 2 puffs first thing in am and then another 2 puffs about 12 hours later.  Plan B = backup= Only use your albuterol (as a rescue medication to be used if you can't catch your breath by resting or doing a relaxed purse lip breathing pattern. The less you use it, the better it will work when you need it. Ok to use up to 2 puffs every 4 hours Plan C = nebulizer, use this only if plan B doesn't work ok to use up to 4 hours     04/28/2013 f/u ov/[name] still smoking  re:  GOLD III/ 02 dep @ 2lpm  Chief Complaint  Patient presents with  . Follow-up    Pt states that his breathing has improved, symbicort seems to be helping some. No new co's today. Using proair approx 4 times per wk.   still doe but has learned to pace himself and overall better but somewhat limited from desired activities even on his best days rec Plan A = Automatic =  symbicort 160 Take 2 puffs first thing in am and then another 2 puffs about 12 hours later.  Add spiriva one capsule daily each am Plan B = backup= Only use your albuterol (proiare) as a rescue medication to be used if you can't catch your breath by resting or doing a relaxed purse lip breathing pattern. The less you use it,  the better it will work when you need it. Ok to use up to 2 puffs every 4 hours Plan C = nebulizer, use this only if plan B doesn't work after 15 min ok to use up to 4 hours   03/14/2014 Follow up and Medication review   rec Use medical med calendar        09/25/2016  Extended summary/ final f/u ov/[name] re:  GOLD  III copd/ 02 2lpm prn / symb 160 2bid/spiriva  Chief Complaint  Patient presents with  . Follow-up    Breathing seems to be improving. He is using albuterol inhaler once per wk on average.   doe = MMRC2 = can't walk a nl pace on a flat grade s sob but does fine slow and flat eg does HT shopping ok on 2lpm  Rare need for saba hfa/ hardly ever needs neb now rec Plan A = Automatic =  symbicort 160 Take 2 puffs first thing in am and then another 2 puffs about 12 hours later.  Incruse one click each am - take two drags to get it all  Plan B = Backup Only use your albuterol as a rescue medication Plan C = Crisis - only use your albuterol nebulizer if you first try Plan B The key is to stop smoking completely before smoking completely stops you!    10/14/16   NP ov Rec Stop INCRUSE  Begin Spiriva Handihaler 1 puff daily .  Continue on Symbicort Twice daily. , brush rinse and gargle after use.  Important to quit smoking  Chest xray today .  Doxycycline 137m Twice daily  For 1 week , take with food.  Zyrtec 131mAt bedtime  As needed  Drainage  Mucinex DM Twice daily As needed  Cough/congestion  Continue on O2 2l/m .     12/11/2016  f/u ov/[name] re:  COPD  GOLD III/ symb / spiriva dpiand 02 2- lpm and still smoking Chief Complaint  Patient presents with  . Follow-up    Pt states his breathing seems worse on Incruse so he has started back on spiriva b/c he had some left over. He is using albuterol inhaler 2 x daily on average and neb 3 x per wk on average.   doe = MMRC2 = can't walk a nl pace on a flat grade s sob but does fine slow and  flat eg [employer] on  RA rec Change to spiriva respimat 2 pffs each am  The key is to stop smoking completely before smoking completely stops you!  Weight control is simply a matter of calorie balance    05/26/2017  Acute  ov/[name] re:   COPD III/ symb/spiriva rare saba / 02 2pm hs then during day prn  Chief Complaint  Patient presents with  . Acute Visit    Increased DOE x 3 days.   most days gets up 6 am with sob/some congestion > symb x 2 and then spiriva and saba and maybe once a week with saba hfa / did not bring it with him  Then worse gradually x 3 days, waking up 4am not using rescue then  Not much cough-   does not bother sleep until 4 am but even then minimal vol of mucus/ white  Needing 02 2lpm 24/7 now  On PPI bid ac  rec Plan A = Automatic =  symbicort 160 Take 2 puffs first thing in am and then another 2 puffs about 12 hours later.                                       spiriva 2 puffs  Work on inhaler technique:   Plan B = Backup Only use your albuterol as a rescue medication Plan C = Crisis - only use your albuterol nebulizer if you first try Plan B The key is to stop smoking completely before smoking completely stops you!  Prednisone 10 mg take  4 each am x 2 days,   2 each am x 2 days,  1 each am x 2 days and stop  Please schedule a follow up visit in 3 months but call sooner if needed    Admit date: 07/21/2017 Discharge date: 07/28/2017  Time spent: 35 minutes  Recommendations for Outpatient Follow-up:  1. Repeat basic metabolic panel to follow electrolytes and renal function 2. Patient needs outpatient sleep study to sit up and determine candidacy for CPAP. 3. Reassess volume  status and blood pressure, adjust medications as needed. 4. Close follow-up to patient's CBG and A1c; further adjust hypoglycemic regimen as indicated.  Discharge Diagnoses:  Active Problems:   HIV disease ([place])   COPD  GOLD III, still smoking   Steroid-induced hyperglycemia    Chronic respiratory failure with hypoxia and hypercapnia (H[facility])   DM (diabetes mellitus) (H[facility])   COPD exacerbation (HCC)   Obstructive sleep apnea syndrome   Essential hypertension, benign   Atopic eczema   Type 2 diabetes mellitus with complication (HCC)   Morbid (severe) obesity due to excess calories ([place])   COPD with acute exacerbation (HCC)   Depression   GERD (gastroesophageal reflux disease)   Acute on chronic respiratory failure with hypoxia ([place])    08/05/2017  Post hosp  f/u ov/[name] re:  p copd exac - did not follow action plan reviewed at last ov prior to ER Chief Complaint  Patient presents with  . Follow-up    hospital f/u- [facility], D/C last week with COPD, using 3L [place], still has some SOB  with 02    -  MMRC2 = can't walk a nl pace on a flat grade s sob but does fine slow and flat  Using 02 as needed daytime and 2lpm hs  rec Work on inhaler technique:   Plan A = Automatic =  symbicort 160 Take 2 puffs first thing in am and then another 2 puffs about 12 hours later. spiriva 2 puffs in am only  Plan B = Backup Only use your albuterol as a rescue medication  Plan C = Crisis - only use your albuterol nebulizer if you first try Plan B and it fails to help > ok to use the nebulizer up to every 4 hours but if start needing it regularly call for immediate appointment The key is to stop smoking completely before smoking completely stops you!  Keep your follow up appt but bring all inhalers and nebulizer solutions with you to this visit    09/22/2017  f/u ov/[name] re: still smoking/ did not bring meds as rec/ saba hfa maybe twice  A week  On maint symb 160/spiriva  Chief Complaint  Patient presents with  . Follow-up    Pt c/o chest tightness on the left off and on for the past 3 wks.   Dyspnea:  Better than usual able to do [employer] slow pace = MMRC2 = can't walk a nl pace on a flat grade s sob but does fine slow and flat  Cough: minimal/ mostly am mucoicd Sleep: on 2lpm on side  and 2 pillows ok   Onset of L breast pain x 3 weeks comes and goes not daily 15-30s  Tight sensation better supine  No assoc nausea / diaphoresis or pleuritic component.  More likely sitting than walking  No obvious day to day or daytime variability or assoc excess/ purulent sputum or mucus plugs or hemoptysis or c  chest tightness, subjective wheeze or overt sinus or hb symptoms. No unusual exposure hx or h/o childhood pna/ asthma or knowledge of premature birth.  Sleeping ok flat without nocturnal  or early am exacerbation  of respiratory  c/o's or need for noct saba. Also denies any obvious fluctuation of symptoms with weather or environmental changes or other aggravating or alleviating factors except as outlined above   Current Allergies, Complete Past Medical History, Past Surgical History, Family History, and Social History were reviewed in [employer] record.  ROS  The  following are not active complaints unless bolded Hoarseness, sore throat, dysphagia, dental problems, itching, sneezing,  nasal congestion or discharge of excess mucus or purulent secretions, ear ache,   fever, chills, sweats, unintended wt loss or wt gain, classically pleuritic or exertional cp,  orthopnea pnd or leg swelling, presyncope, palpitations, abdominal pain, anorexia, nausea, vomiting, diarrhea  or change in bowel habits or change in bladder habits, change in stools or change in urine, dysuria, hematuria,  rash, arthralgias, visual complaints, headache, numbness, weakness or ataxia or problems with walking or coordination,  change in mood/affect or memory.        Current Meds  Medication Sig  . ACCU-CHEK SOFTCLIX LANCETS lancets Use as instructed  . acetaminophen-codeine (TYLENOL #3) 300-30 MG tablet Take 1 tablet by mouth every 8 (eight) hours as needed for moderate pain.  [name] albuterol (PROVENTIL HFA;VENTOLIN HFA) 108 (90 Base) MCG/ACT inhaler Inhale 2 puffs into the lungs every 4 (four)  hours as needed for wheezing or shortness of breath (((PLAN B))).  [name] apixaban (ELIQUIS) 5 MG TABS tablet Take 1 tablet (5 mg total) by mouth 2 (two) times daily.  [name] atorvastatin (LIPITOR) 20 MG tablet Take 1 tablet (20 mg total) by mouth daily.  . bictegravir-emtricitabine-tenofovir AF (BIKTARVY) 50-200-25 MG TABS tablet Take 1 tablet by mouth daily. (Patient taking differently: Take 1 tablet by mouth daily at 3 pm. )  . Blood Glucose Monitoring Suppl (ACCU-CHEK AVIVA PLUS) W/DEVICE KIT 250 Test up to 4 times daily  . clotrimazole (LOTRIMIN) 1 % cream Apply 1 application topically 2 (two) times daily.  . clotrimazole-betamethasone (LOTRISONE) cream Apply 1 application topically 2 (two) times daily.  [name] diltiazem (CARDIZEM CD) 360 MG 24 hr capsule Take 1 capsule (360 mg total) by mouth daily.  . EZ SMART BLOOD GLUCOSE LANCETS MISC 1 Units by Does not apply route 4 (four) times daily.  . furosemide (LASIX) 20 MG tablet Take 1 tablet (20 mg total) by mouth daily.  [name] glucose blood test strip Use as instructed  . Insulin Pen Needle (B-D UF III MINI PEN NEEDLES) 31G X 5 MM MISC Use as directed  . Insulin Syringe-Needle U-100 (BD INSULIN SYRINGE ULTRAFINE) 31G X 15/64" 0.5 ML MISC Use as directed  . ipratropium-albuterol (DUONEB) 0.5-2.5 (3) MG/3ML SOLN Take 3 mLs by nebulization every 6 (six) hours as needed.  . metFORMIN (GLUCOPHAGE) 1000 MG tablet Take 1 tablet (1,000 mg total) by mouth 2 (two) times daily with a meal.  . OXYGEN Place 2 L into the nose as needed (shortness of breath).   . pantoprazole (PROTONIX) 40 MG tablet Take 1 tablet (40 mg total) by mouth 2 (two) times daily before a meal. (Patient taking differently: Take 40 mg by mouth 3 times/day as needed-between meals & bedtime. )  . sildenafil (VIAGRA) 50 MG tablet Take 0.5 tablets (25 mg total) by mouth daily as needed for erectile dysfunction.  [name] SPIRIVA RESPIMAT 2.5 MCG/ACT AERS INHALE 2 PUFFS INTO THE LUNGS DAILY  . urea (CARMOL) 20 %  cream Apply as needed topically.  . [  budesonide-formoterol (SYMBICORT) 160-4.5 MCG/ACT inhaler Inhale 2 puffs into the lungs 2 (two) times daily.                          Objective:   Physical Exam   amb obese bm nad  Vital signs reviewed - Note on arrival 02 sats  91% on RA  01/05/2015       278 >  02/29/2016  274 > 06/17/2016 277 > 09/25/2016   258  > 12/11/2016   270  > 05/26/2017  270 > 08/05/2017  255  > 09/22/2017   262  01/28/2016        272   05/16/14 285 lb 6.4 oz (129.457 kg)  04/20/14 282 lb 12.8 oz (128.277 kg)  03/14/14 274 lb 3.2 oz (124.376 kg)       min thrush/ min pseduo wheeze better with plm   HEENT: nl dentition, turbinates bilaterally, and oropharynx x for min thrush. Nl external ear canals without cough reflex   NECK :  without JVD/Nodes/TM/ nl carotid upstrokes bilaterally   LUNGS: no acc muscle use,  Nl contour chest  With distant bs bilaterally and  mild pseudowheeze better with plm    CV:  RRR  no s3 or murmur or increase in P2, and no edema   ABD:  soft and nontender with nl inspiratory excursion in the supine position. No bruits or organomegaly appreciated, bowel sounds nl  MS:  Nl gait/ ext warm without deformities, calf tenderness, cyanosis or clubbing No obvious joint restrictions   SKIN: warm and dry without lesions    NEURO:  alert, approp, nl sensorium with  no motor or cerebellar deficits apparent.  .           Assessment & Plan:

## 2017-09-25 ENCOUNTER — Other Ambulatory Visit: Payer: Self-pay

## 2017-09-25 ENCOUNTER — Encounter: Payer: Self-pay | Admitting: Cardiology

## 2017-09-25 ENCOUNTER — Encounter: Payer: Self-pay | Admitting: Internal Medicine

## 2017-09-25 ENCOUNTER — Ambulatory Visit (INDEPENDENT_AMBULATORY_CARE_PROVIDER_SITE_OTHER): Payer: Medicare HMO | Admitting: Cardiology

## 2017-09-25 ENCOUNTER — Ambulatory Visit (INDEPENDENT_AMBULATORY_CARE_PROVIDER_SITE_OTHER): Payer: Medicare HMO | Admitting: Internal Medicine

## 2017-09-25 VITALS — BP 120/73 | HR 84 | Ht 69.0 in | Wt 262.0 lb

## 2017-09-25 VITALS — Wt 265.0 lb

## 2017-09-25 DIAGNOSIS — R7989 Other specified abnormal findings of blood chemistry: Secondary | ICD-10-CM | POA: Diagnosis not present

## 2017-09-25 DIAGNOSIS — I5032 Chronic diastolic (congestive) heart failure: Secondary | ICD-10-CM | POA: Insufficient documentation

## 2017-09-25 DIAGNOSIS — R69 Illness, unspecified: Secondary | ICD-10-CM | POA: Diagnosis not present

## 2017-09-25 DIAGNOSIS — J449 Chronic obstructive pulmonary disease, unspecified: Secondary | ICD-10-CM | POA: Diagnosis not present

## 2017-09-25 DIAGNOSIS — E1165 Type 2 diabetes mellitus with hyperglycemia: Secondary | ICD-10-CM

## 2017-09-25 DIAGNOSIS — Z794 Long term (current) use of insulin: Secondary | ICD-10-CM

## 2017-09-25 DIAGNOSIS — E118 Type 2 diabetes mellitus with unspecified complications: Secondary | ICD-10-CM

## 2017-09-25 DIAGNOSIS — R079 Chest pain, unspecified: Secondary | ICD-10-CM | POA: Diagnosis not present

## 2017-09-25 DIAGNOSIS — I480 Paroxysmal atrial fibrillation: Secondary | ICD-10-CM

## 2017-09-25 DIAGNOSIS — B20 Human immunodeficiency virus [HIV] disease: Secondary | ICD-10-CM

## 2017-09-25 DIAGNOSIS — I10 Essential (primary) hypertension: Secondary | ICD-10-CM | POA: Diagnosis not present

## 2017-09-25 LAB — COMPLETE METABOLIC PANEL WITH GFR
AG Ratio: 1.2 (calc) (ref 1.0–2.5)
ALKALINE PHOSPHATASE (APISO): 54 U/L (ref 40–115)
ALT: 12 U/L (ref 9–46)
AST: 12 U/L (ref 10–35)
Albumin: 3.8 g/dL (ref 3.6–5.1)
BUN/Creatinine Ratio: 8 (calc) (ref 6–22)
BUN: 6 mg/dL — ABNORMAL LOW (ref 7–25)
CALCIUM: 8.8 mg/dL (ref 8.6–10.3)
CO2: 29 mmol/L (ref 20–32)
Chloride: 97 mmol/L — ABNORMAL LOW (ref 98–110)
Creat: 0.8 mg/dL (ref 0.70–1.33)
GFR, EST NON AFRICAN AMERICAN: 103 mL/min/{1.73_m2} (ref >=60)
GFR, Est African American: 120 mL/min/{1.73_m2} (ref >=60)
GLOBULIN: 3.2 g/dL (ref 1.9–3.7)
GLUCOSE: 314 mg/dL — AB (ref 65–99)
Potassium: 3.7 mmol/L (ref 3.5–5.3)
SODIUM: 135 mmol/L (ref 135–146)
Total Bilirubin: 0.5 mg/dL (ref 0.2–1.2)
Total Protein: 7 g/dL (ref 6.1–8.1)

## 2017-09-25 LAB — CBC WITH DIFFERENTIAL/PLATELET
BASOS PCT: 0.7 %
Basophils Absolute: 53 cells/uL (ref 0–200)
EOS ABS: 190 {cells}/uL (ref 15–500)
Eosinophils Relative: 2.5 %
HCT: 42.8 % (ref 38.5–50.0)
Hemoglobin: 13.6 g/dL (ref 13.2–17.1)
Lymphs Abs: 2599 cells/uL (ref 850–3900)
MCH: 27.9 pg (ref 27.0–33.0)
MCHC: 31.8 g/dL — ABNORMAL LOW (ref 32.0–36.0)
MCV: 87.9 fL (ref 80.0–100.0)
MPV: 10.9 fL (ref 7.5–12.5)
Monocytes Relative: 9.2 %
Neutro Abs: 4058 cells/uL (ref 1500–7800)
Neutrophils Relative %: 53.4 %
Platelets: 234 10*3/uL (ref 140–400)
RBC: 4.87 10*6/uL (ref 4.20–5.80)
RDW: 11.7 % (ref 11.0–15.0)
TOTAL LYMPHOCYTE: 34.2 %
WBC mixed population: 699 cells/uL (ref 200–950)
WBC: 7.6 10*3/uL (ref 3.8–10.8)

## 2017-09-25 LAB — T-HELPER CELL (CD4) - (RCID CLINIC ONLY)
CD4 % Helper T Cell: 16 % — ABNORMAL LOW (ref 33–55)
CD4 T CELL ABS: 430 /uL (ref 400–2700)

## 2017-09-25 MED ORDER — TESTOSTERONE 12.5 MG/ACT (1%) TD GEL: 1.0000 | Freq: Every day | TRANSDERMAL | 2 refills | Status: DC
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
## 2017-09-25 NOTE — Patient Instructions (Signed)
No change with medications    [address] Your physician has requested that you have a lexiscan myoview. For further information please visit [email or web address]. Please follow instruction sheet, as given.     Your physician recommends that you schedule a follow-up appointment in 1 MONTH WITH DR HARDING.

## 2017-09-25 NOTE — Progress Notes (Signed)
RFV: follow up for hiv disease  Patient ID: Chris [name]., male   DOB: September 09, 1965, 52 y.o.   MRN: [id number]  HPI Chris, [name] 52yo M with HIV disease, Cd 4 count 380/VL<20, doing well with adherence to hiv medication. He is now only using supplemental oxygen when symptomatic. He reports fatigue and ongoing sexual dysfunction. At last visit, testoterone was low on blood work. Interested in supplementation  Outpatient Encounter Medications as of 09/25/2017  Medication Sig  . ACCU-CHEK SOFTCLIX LANCETS lancets Use as instructed  . acetaminophen-codeine (TYLENOL #3) 300-30 MG tablet Take 1 tablet by mouth every 8 (eight) hours as needed for moderate pain.  [name] albuterol (PROVENTIL HFA;VENTOLIN HFA) 108 (90 Base) MCG/ACT inhaler Inhale 2 puffs into the lungs every 4 (four) hours as needed for wheezing or shortness of breath (((PLAN B))).  [name] apixaban (ELIQUIS) 5 MG TABS tablet Take 1 tablet (5 mg total) by mouth 2 (two) times daily.  [name] atorvastatin (LIPITOR) 20 MG tablet Take 1 tablet (20 mg total) by mouth daily.  . bictegravir-emtricitabine-tenofovir AF (BIKTARVY) 50-200-25 MG TABS tablet Take 1 tablet by mouth daily. (Patient taking differently: Take 1 tablet by mouth daily at 3 pm. )  . Blood Glucose Monitoring Suppl (ACCU-CHEK AVIVA PLUS) W/DEVICE KIT 250 Test up to 4 times daily  . budesonide-formoterol (SYMBICORT) 80-4.5 MCG/ACT inhaler Take 2 puffs first thing in am and then another 2 puffs about 12 hours later.  . clotrimazole (LOTRIMIN) 1 % cream Apply 1 application topically 2 (two) times daily.  . clotrimazole (MYCELEX) 10 MG troche Take 1 tablet (10 mg total) by mouth 5 (five) times daily.  . clotrimazole-betamethasone (LOTRISONE) cream Apply 1 application topically 2 (two) times daily.  [name] diltiazem (CARDIZEM CD) 360 MG 24 hr capsule Take 1 capsule (360 mg total) by mouth daily.  . EZ SMART BLOOD GLUCOSE LANCETS MISC 1 Units by Does not apply route 4 (four) times daily.  . furosemide  (LASIX) 20 MG tablet Take 1 tablet (20 mg total) by mouth daily.  [name] glucose blood test strip Use as instructed  . Insulin Pen Needle (B-D UF III MINI PEN NEEDLES) 31G X 5 MM MISC Use as directed  . Insulin Syringe-Needle U-100 (BD INSULIN SYRINGE ULTRAFINE) 31G X 15/64" 0.5 ML MISC Use as directed  . ipratropium-albuterol (DUONEB) 0.5-2.5 (3) MG/3ML SOLN Take 3 mLs by nebulization every 6 (six) hours as needed.  . metFORMIN (GLUCOPHAGE) 1000 MG tablet Take 1 tablet (1,000 mg total) by mouth 2 (two) times daily with a meal.  . OXYGEN Place 2 L into the nose as needed (shortness of breath).   . pantoprazole (PROTONIX) 40 MG tablet Take 1 tablet (40 mg total) by mouth 2 (two) times daily before a meal. (Patient taking differently: Take 40 mg by mouth 3 times/day as needed-between meals & bedtime. )  . sildenafil (VIAGRA) 50 MG tablet Take 0.5 tablets (25 mg total) by mouth daily as needed for erectile dysfunction.  [name] SPIRIVA RESPIMAT 2.5 MCG/ACT AERS INHALE 2 PUFFS INTO THE LUNGS DAILY  . terbinafine (LAMISIL) 250 MG tablet Take 1 tablet (250 mg total) by mouth daily.  . urea (CARMOL) 20 % cream Apply as needed topically.   No facility-administered encounter medications on file as of 09/25/2017.      Patient Active Problem List   Diagnosis Date Noted  . Depression 07/21/2017  . GERD (gastroesophageal reflux disease) 07/21/2017  . Acute on chronic respiratory failure with hypoxia ([place]) 07/21/2017  .  Dental caries 06/01/2017  . COPD with acute exacerbation ([place]) 05/26/2017  . Acute bronchitis 11/12/2016  . Special screening for malignant neoplasms, colon   . Dysphagia   . Candida esophagitis ([place])   . Abdominal pain, epigastric 07/17/2016  . Candidiasis of mouth 03/27/2016  . Adjustment disorder with depressed mood 01/04/2016  . Callus of foot 01/04/2016  . Hoarseness of voice 10/04/2015  . Type 2 diabetes mellitus with complication, with long-term current use of insulin ([place]) 10/04/2015    . Cigarette nicotine dependence without complication 93/81/8299  . Morbid (severe) obesity due to excess calories ([place]) 09/04/2015  . Primary osteoarthritis of right knee 03/26/2015  . Genital warts 01/22/2015  . Other male erectile dysfunction 01/22/2015  . Type 2 diabetes mellitus with complication ([place]) 37/16/9678  . Essential hypertension, benign 12/04/2014  . Atopic eczema 12/04/2014  . COPD exacerbation ([place]) 08/23/2013  . Cyst (solitary) of breast 08/23/2013  . Breast abscess 08/23/2013  . Obstructive sleep apnea syndrome 08/23/2013  . Cigarette smoker 02/17/2013  . Hyperglycemia 02/17/2013  . Uncontrolled diabetes mellitus ([place]) 02/17/2013  . DM (diabetes mellitus) ([place]) 01/27/2013  . Steroid-induced hyperglycemia 01/12/2013  . Chronic respiratory failure with hypoxia and hypercapnia ([place]) 01/12/2013  . COPD  GOLD III, still smoking 01/11/2013  . Nicotine abuse 01/11/2013  . HIV disease ([place]) 12/06/2012     Health Maintenance Due  Topic Date Due  . FOOT EXAM  04/12/1976  . OPHTHALMOLOGY EXAM  04/12/1976  . TETANUS/TDAP  04/12/1985  . PNEUMOCOCCAL POLYSACCHARIDE VACCINE (2) 05/11/2017     Review of Systems Per hpi, otherswise 12 point ros is negative Physical Exam   Wt 265 lb (120.2 kg)   BMI 39.13 kg/m  Physical Exam  Constitutional: He is oriented to person, place, and time. He appears well-developed and well-nourished. No distress.  HENT:  Mouth/Throat: Oropharynx is clear and moist. No oropharyngeal exudate.  Cardiovascular: Normal rate, regular rhythm and normal heart sounds. Exam reveals no gallop and no friction rub.  No murmur heard.  Pulmonary/Chest: Effort normal and breath sounds normal. No respiratory distress. He has no wheezes.  Abdominal: Soft. Bowel sounds are normal. He exhibits no distension. There is no tenderness.  Lymphadenopathy:  He has no cervical adenopathy.  Neurological: He is alert and oriented to person, place, and time.  Skin:  Skin is warm and dry. No rash noted. No erythema.  Psychiatric: He has a normal mood and affect. His behavior is normal.    Lab Results  Component Value Date   CD4TCELL 13 (L) 05/19/2017   Lab Results  Component Value Date   CD4TABS 380 (L) 05/19/2017   CD4TABS 240 (L) 03/31/2017   CD4TABS 370 (L) 11/13/2016   Lab Results  Component Value Date   HIV1RNAQUANT <20 NOT DETECTED 05/19/2017   Lab Results  Component Value Date   HEPBSAB REACTIVE (A) 12/06/2012   Lab Results  Component Value Date   LABRPR NON REAC 06/23/2016    CBC Lab Results  Component Value Date   WBC 13.5 (H) 07/28/2017   RBC 5.03 07/28/2017   HGB 14.4 07/28/2017   HCT 46.0 07/28/2017   PLT 170 07/28/2017   MCV 91.5 07/28/2017   MCH 28.6 07/28/2017   MCHC 31.3 07/28/2017   RDW 12.2 07/28/2017   LYMPHSABS 1.1 07/24/2017   MONOABS 1.0 07/24/2017   EOSABS 0.0 07/24/2017    BMET Lab Results  Component Value Date   NA 135 07/28/2017   K 4.3 07/28/2017  CL 95 (L) 07/28/2017   CO2 34 (H) 07/28/2017   GLUCOSE 309 (H) 07/28/2017   BUN 27 (H) 07/28/2017   CREATININE 0.85 07/28/2017   CALCIUM 8.7 (L) 07/28/2017   GFRNONAA >60 07/28/2017   GFRAA >60 07/28/2017      Assessment and Plan  Poorly controlled DM = blood work reveals elevated BS. Reinforced importance to good BS control  Hiv disease = well controlled. Will check labs. Continue on biktarvy  New afib with rvr with PE = on eluiqis  Low testosterone = will start testoterone foam

## 2017-09-27 ENCOUNTER — Encounter: Payer: Self-pay | Admitting: Internal Medicine

## 2017-09-27 ENCOUNTER — Encounter: Payer: Self-pay | Admitting: Cardiology

## 2017-09-27 NOTE — Assessment & Plan Note (Signed)
rx clotrimazole troche 10 mg qid prn

## 2017-09-27 NOTE — Assessment & Plan Note (Signed)
-   04/28/2013 PFT's FEV1 1.21 (39%) ratio 59 and 13% better p B2 and dlco 72 corrects to 102  - started spiriva 04/30/2013 > changed to respimat 02/28/2014 -Med calendar 03/14/2014 > not using as of 05/16/14  - arrived on stiolto / symbicort 02/29/2016 > changed to symbicort 80/spiriva (lower dose ICS due to interaction with aids meds  - PFT's  06/17/2016  FEV1 1.29 (42 % ) ratio 64  p 43 % improvement from saba p symb 80 /spiriva prior to study with DLCO  51/49c % corrects to 86  % for alv volume   - 06/17/2016   change symb to 160 2bid  - 09/25/2016  After extensive coaching device  effectiveness =    90% with dpi/ elipta > changed to Incruse per Insurance restrictions > preferred spiriva - 12/11/2016   changed to spiriva respimat  08/05/2017  After extensive coaching HFA effectiveness =  90%     Actually doing well x for pseudowheeze and thrush may be related to symbicort 160 so rec try 80 2bid to see if helps    I had an extended discussion with the patient reviewing all relevant studies completed to date and  lasting 15 to 20 minutes of a 25 minute visit    Each maintenance medication was reviewed in detail including most importantly the difference between maintenance and prns and under what circumstances the prns are to be triggered using an action plan format that is not reflected in the computer generated alphabetically organized AVS.    Please see AVS for specific instructions unique to this visit that I personally wrote and verbalized to the the pt in detail and then reviewed with pt  by my nurse highlighting any  changes in therapy recommended at today's visit to their plan of care.

## 2017-09-27 NOTE — Assessment & Plan Note (Addendum)
He has lots of chest discomfort which sounds to be musculoskeletal or related to coughing.  However there are some features that are somewhat typical in nature to the fact that his exertional and associated with dyspnea. Cardiac risk factors include: Smoking, hypertension, hyperlipidemia and report of diabetes.  (This plus obesity would make criteria for metabolic syndrome).  Plan: Lexiscan Myoview stress test to exclude ischemia.

## 2017-09-27 NOTE — Assessment & Plan Note (Signed)
Likley HTN related heart disease,but preserved LVEF.  Certainly COPD Sx of Dyspnea is exacerbated by increased HR - especially Afib.  Currently appears euvolemic.  Not on diuretic. On diltiazem for rate control.

## 2017-09-27 NOTE — Assessment & Plan Note (Signed)
>   3 min Discussed the risks and costs (both direct and indirect)  of smoking relative to the benefits of quitting but patient unwilling to commit at this point to a specific quit date.       

## 2017-09-27 NOTE — Assessment & Plan Note (Signed)
-    HCO3 31 01/17/16 - 01/05/2015  Walked RA x 3 laps @ 185 ft each stopped due to  Leg pain and fatibue, no sob , nl pace, no desat  -  12/11/2016 Patient Saturations on Room Air at Rest = 88%----increased 98% 2lpm continuous  Not using 02 as previously rec > will review home use with him but doesn't appear to need it at rest / may need it with ex esp if concern re IHD

## 2017-09-27 NOTE — Assessment & Plan Note (Signed)
Blood pressure actually looks pretty good on diltiazem alone.  Continue to monitor.  Low threshold for afterload reduction.

## 2017-09-27 NOTE — Assessment & Plan Note (Addendum)
Cp presently is non- pleuritic worse sitting than walking/ relieved supine typical of IBS and not of pulmonary or cardiac etiology > rec citrucel and avoid food you know causes gas   Cards f/u planned

## 2017-09-27 NOTE — Assessment & Plan Note (Signed)
New onset of paroxysmal atrial fibrillation, likely triggered by his COPD exacerbation.  Hopefully this will be the only time that these episodes occur.  Remain on diltiazem for rate control.  CHA2DS2Vasc = 2-3 --> anticoagulated with Eliquis. Explained to him the reasoning for anti-regulation for stroke prevention.  This also allows us the potential of cardioversion ready to have another exacerbation and be in A. fib RVR.  He understood and agrees. No thoughts of antiarrhythmic agents unless he has recurrence.

## 2017-09-28 ENCOUNTER — Encounter: Payer: Self-pay | Admitting: Cardiology

## 2017-09-29 LAB — HIV-1 RNA QUANT-NO REFLEX-BLD
HIV 1 RNA QUANT: 22 {copies}/mL — AB
HIV-1 RNA Quant, Log: 1.34 Log copies/mL — ABNORMAL HIGH

## 2017-09-30 ENCOUNTER — Telehealth (HOSPITAL_COMMUNITY): Payer: Self-pay

## 2017-09-30 NOTE — Telephone Encounter (Signed)
Encounter complete. 

## 2017-10-01 ENCOUNTER — Inpatient Hospital Stay (HOSPITAL_COMMUNITY)
Admission: EM | Admit: 2017-10-01 | Discharge: 2017-10-30 | DRG: 004 | Disposition: A | Discharge status: Other Acute Inpatient Hospital | Payer: Medicare HMO | Attending: Internal Medicine | Admitting: Internal Medicine

## 2017-10-01 ENCOUNTER — Emergency Department (HOSPITAL_COMMUNITY): Payer: Medicare HMO

## 2017-10-01 ENCOUNTER — Encounter (HOSPITAL_COMMUNITY): Payer: Self-pay | Admitting: Emergency Medicine

## 2017-10-01 ENCOUNTER — Other Ambulatory Visit: Payer: Self-pay

## 2017-10-01 DIAGNOSIS — J441 Chronic obstructive pulmonary disease with (acute) exacerbation: Secondary | ICD-10-CM | POA: Diagnosis present

## 2017-10-01 DIAGNOSIS — Z79899 Other long term (current) drug therapy: Secondary | ICD-10-CM | POA: Diagnosis not present

## 2017-10-01 DIAGNOSIS — J431 Panlobular emphysema: Secondary | ICD-10-CM

## 2017-10-01 DIAGNOSIS — E118 Type 2 diabetes mellitus with unspecified complications: Secondary | ICD-10-CM

## 2017-10-01 DIAGNOSIS — R69 Illness, unspecified: Secondary | ICD-10-CM | POA: Diagnosis not present

## 2017-10-01 DIAGNOSIS — I480 Paroxysmal atrial fibrillation: Secondary | ICD-10-CM | POA: Diagnosis present

## 2017-10-01 DIAGNOSIS — Z452 Encounter for adjustment and management of vascular access device: Secondary | ICD-10-CM | POA: Diagnosis not present

## 2017-10-01 DIAGNOSIS — Y95 Nosocomial condition: Secondary | ICD-10-CM | POA: Diagnosis not present

## 2017-10-01 DIAGNOSIS — F419 Anxiety disorder, unspecified: Secondary | ICD-10-CM | POA: Diagnosis present

## 2017-10-01 DIAGNOSIS — J9811 Atelectasis: Secondary | ICD-10-CM | POA: Diagnosis present

## 2017-10-01 DIAGNOSIS — F329 Major depressive disorder, single episode, unspecified: Secondary | ICD-10-CM | POA: Diagnosis present

## 2017-10-01 DIAGNOSIS — G9341 Metabolic encephalopathy: Secondary | ICD-10-CM | POA: Diagnosis not present

## 2017-10-01 DIAGNOSIS — J9601 Acute respiratory failure with hypoxia: Secondary | ICD-10-CM | POA: Diagnosis not present

## 2017-10-01 DIAGNOSIS — J90 Pleural effusion, not elsewhere classified: Secondary | ICD-10-CM | POA: Diagnosis not present

## 2017-10-01 DIAGNOSIS — B3781 Candidal esophagitis: Secondary | ICD-10-CM

## 2017-10-01 DIAGNOSIS — J101 Influenza due to other identified influenza virus with other respiratory manifestations: Secondary | ICD-10-CM | POA: Diagnosis not present

## 2017-10-01 DIAGNOSIS — E878 Other disorders of electrolyte and fluid balance, not elsewhere classified: Secondary | ICD-10-CM | POA: Diagnosis present

## 2017-10-01 DIAGNOSIS — E871 Hypo-osmolality and hyponatremia: Secondary | ICD-10-CM | POA: Diagnosis present

## 2017-10-01 DIAGNOSIS — I4891 Unspecified atrial fibrillation: Secondary | ICD-10-CM | POA: Diagnosis not present

## 2017-10-01 DIAGNOSIS — I110 Hypertensive heart disease with heart failure: Secondary | ICD-10-CM | POA: Diagnosis present

## 2017-10-01 DIAGNOSIS — J1000 Influenza due to other identified influenza virus with unspecified type of pneumonia: Secondary | ICD-10-CM | POA: Diagnosis not present

## 2017-10-01 DIAGNOSIS — J9382 Other air leak: Secondary | ICD-10-CM | POA: Diagnosis not present

## 2017-10-01 DIAGNOSIS — IMO0001 Reserved for inherently not codable concepts without codable children: Secondary | ICD-10-CM

## 2017-10-01 DIAGNOSIS — G9340 Encephalopathy, unspecified: Secondary | ICD-10-CM | POA: Diagnosis not present

## 2017-10-01 DIAGNOSIS — K5900 Constipation, unspecified: Secondary | ICD-10-CM | POA: Diagnosis not present

## 2017-10-01 DIAGNOSIS — J9602 Acute respiratory failure with hypercapnia: Secondary | ICD-10-CM

## 2017-10-01 DIAGNOSIS — Z978 Presence of other specified devices: Secondary | ICD-10-CM

## 2017-10-01 DIAGNOSIS — R0603 Acute respiratory distress: Secondary | ICD-10-CM | POA: Diagnosis not present

## 2017-10-01 DIAGNOSIS — D6489 Other specified anemias: Secondary | ICD-10-CM | POA: Diagnosis present

## 2017-10-01 DIAGNOSIS — N289 Disorder of kidney and ureter, unspecified: Secondary | ICD-10-CM | POA: Diagnosis not present

## 2017-10-01 DIAGNOSIS — R0600 Dyspnea, unspecified: Secondary | ICD-10-CM | POA: Diagnosis not present

## 2017-10-01 DIAGNOSIS — E870 Hyperosmolality and hypernatremia: Secondary | ICD-10-CM | POA: Diagnosis not present

## 2017-10-01 DIAGNOSIS — N179 Acute kidney failure, unspecified: Secondary | ICD-10-CM | POA: Diagnosis not present

## 2017-10-01 DIAGNOSIS — R0602 Shortness of breath: Secondary | ICD-10-CM | POA: Diagnosis not present

## 2017-10-01 DIAGNOSIS — D696 Thrombocytopenia, unspecified: Secondary | ICD-10-CM | POA: Diagnosis not present

## 2017-10-01 DIAGNOSIS — Z7189 Other specified counseling: Secondary | ICD-10-CM | POA: Diagnosis not present

## 2017-10-01 DIAGNOSIS — J1001 Influenza due to other identified influenza virus with the same other identified influenza virus pneumonia: Secondary | ICD-10-CM | POA: Diagnosis not present

## 2017-10-01 DIAGNOSIS — R451 Restlessness and agitation: Secondary | ICD-10-CM | POA: Diagnosis not present

## 2017-10-01 DIAGNOSIS — IMO0002 Reserved for concepts with insufficient information to code with codable children: Secondary | ICD-10-CM

## 2017-10-01 DIAGNOSIS — E872 Acidosis: Secondary | ICD-10-CM | POA: Diagnosis present

## 2017-10-01 DIAGNOSIS — N19 Unspecified kidney failure: Secondary | ICD-10-CM

## 2017-10-01 DIAGNOSIS — J189 Pneumonia, unspecified organism: Secondary | ICD-10-CM | POA: Diagnosis not present

## 2017-10-01 DIAGNOSIS — R918 Other nonspecific abnormal finding of lung field: Secondary | ICD-10-CM | POA: Diagnosis not present

## 2017-10-01 DIAGNOSIS — I5033 Acute on chronic diastolic (congestive) heart failure: Secondary | ICD-10-CM | POA: Diagnosis not present

## 2017-10-01 DIAGNOSIS — J9600 Acute respiratory failure, unspecified whether with hypoxia or hypercapnia: Secondary | ICD-10-CM

## 2017-10-01 DIAGNOSIS — J209 Acute bronchitis, unspecified: Secondary | ICD-10-CM | POA: Diagnosis present

## 2017-10-01 DIAGNOSIS — J439 Emphysema, unspecified: Secondary | ICD-10-CM | POA: Diagnosis present

## 2017-10-01 DIAGNOSIS — Z930 Tracheostomy status: Secondary | ICD-10-CM | POA: Diagnosis not present

## 2017-10-01 DIAGNOSIS — T82538A Leakage of other cardiac and vascular devices and implants, initial encounter: Secondary | ICD-10-CM

## 2017-10-01 DIAGNOSIS — R000 Tachycardia, unspecified: Secondary | ICD-10-CM | POA: Diagnosis not present

## 2017-10-01 DIAGNOSIS — B20 Human immunodeficiency virus [HIV] disease: Secondary | ICD-10-CM | POA: Diagnosis present

## 2017-10-01 DIAGNOSIS — R05 Cough: Secondary | ICD-10-CM | POA: Diagnosis not present

## 2017-10-01 DIAGNOSIS — Z720 Tobacco use: Secondary | ICD-10-CM | POA: Diagnosis present

## 2017-10-01 DIAGNOSIS — J9690 Respiratory failure, unspecified, unspecified whether with hypoxia or hypercapnia: Secondary | ICD-10-CM

## 2017-10-01 DIAGNOSIS — R1310 Dysphagia, unspecified: Secondary | ICD-10-CM | POA: Diagnosis present

## 2017-10-01 DIAGNOSIS — R079 Chest pain, unspecified: Secondary | ICD-10-CM | POA: Diagnosis not present

## 2017-10-01 DIAGNOSIS — Z4682 Encounter for fitting and adjustment of non-vascular catheter: Secondary | ICD-10-CM | POA: Diagnosis not present

## 2017-10-01 DIAGNOSIS — Z9115 Patient's noncompliance with renal dialysis: Secondary | ICD-10-CM

## 2017-10-01 DIAGNOSIS — Z4901 Encounter for fitting and adjustment of extracorporeal dialysis catheter: Secondary | ICD-10-CM | POA: Diagnosis not present

## 2017-10-01 DIAGNOSIS — Z515 Encounter for palliative care: Secondary | ICD-10-CM | POA: Diagnosis not present

## 2017-10-01 DIAGNOSIS — D72829 Elevated white blood cell count, unspecified: Secondary | ICD-10-CM | POA: Diagnosis not present

## 2017-10-01 DIAGNOSIS — Z9981 Dependence on supplemental oxygen: Secondary | ICD-10-CM | POA: Diagnosis not present

## 2017-10-01 DIAGNOSIS — E119 Type 2 diabetes mellitus without complications: Secondary | ICD-10-CM | POA: Diagnosis not present

## 2017-10-01 DIAGNOSIS — J9621 Acute and chronic respiratory failure with hypoxia: Secondary | ICD-10-CM | POA: Diagnosis not present

## 2017-10-01 DIAGNOSIS — D649 Anemia, unspecified: Secondary | ICD-10-CM | POA: Diagnosis not present

## 2017-10-01 DIAGNOSIS — F17210 Nicotine dependence, cigarettes, uncomplicated: Secondary | ICD-10-CM | POA: Diagnosis not present

## 2017-10-01 DIAGNOSIS — E1169 Type 2 diabetes mellitus with other specified complication: Secondary | ICD-10-CM | POA: Diagnosis present

## 2017-10-01 DIAGNOSIS — K219 Gastro-esophageal reflux disease without esophagitis: Secondary | ICD-10-CM | POA: Diagnosis present

## 2017-10-01 DIAGNOSIS — I340 Nonrheumatic mitral (valve) insufficiency: Secondary | ICD-10-CM | POA: Diagnosis not present

## 2017-10-01 DIAGNOSIS — D638 Anemia in other chronic diseases classified elsewhere: Secondary | ICD-10-CM | POA: Diagnosis not present

## 2017-10-01 DIAGNOSIS — E860 Dehydration: Secondary | ICD-10-CM | POA: Diagnosis present

## 2017-10-01 DIAGNOSIS — R0902 Hypoxemia: Secondary | ICD-10-CM

## 2017-10-01 DIAGNOSIS — E11649 Type 2 diabetes mellitus with hypoglycemia without coma: Secondary | ICD-10-CM | POA: Diagnosis not present

## 2017-10-01 DIAGNOSIS — Z008 Encounter for other general examination: Secondary | ICD-10-CM | POA: Diagnosis not present

## 2017-10-01 DIAGNOSIS — J449 Chronic obstructive pulmonary disease, unspecified: Secondary | ICD-10-CM | POA: Diagnosis not present

## 2017-10-01 DIAGNOSIS — Z882 Allergy status to sulfonamides status: Secondary | ICD-10-CM

## 2017-10-01 DIAGNOSIS — Z7984 Long term (current) use of oral hypoglycemic drugs: Secondary | ICD-10-CM

## 2017-10-01 DIAGNOSIS — Z789 Other specified health status: Secondary | ICD-10-CM

## 2017-10-01 DIAGNOSIS — N170 Acute kidney failure with tubular necrosis: Secondary | ICD-10-CM | POA: Diagnosis not present

## 2017-10-01 DIAGNOSIS — Z6841 Body Mass Index (BMI) 40.0 and over, adult: Secondary | ICD-10-CM | POA: Diagnosis not present

## 2017-10-01 DIAGNOSIS — E785 Hyperlipidemia, unspecified: Secondary | ICD-10-CM | POA: Diagnosis present

## 2017-10-01 DIAGNOSIS — Z9911 Dependence on respirator [ventilator] status: Secondary | ICD-10-CM | POA: Diagnosis not present

## 2017-10-01 DIAGNOSIS — R509 Fever, unspecified: Secondary | ICD-10-CM | POA: Diagnosis not present

## 2017-10-01 DIAGNOSIS — I482 Chronic atrial fibrillation: Secondary | ICD-10-CM | POA: Diagnosis not present

## 2017-10-01 DIAGNOSIS — Z21 Asymptomatic human immunodeficiency virus [HIV] infection status: Secondary | ICD-10-CM | POA: Diagnosis present

## 2017-10-01 DIAGNOSIS — J40 Bronchitis, not specified as acute or chronic: Secondary | ICD-10-CM | POA: Diagnosis present

## 2017-10-01 DIAGNOSIS — J09X1 Influenza due to identified novel influenza A virus with pneumonia: Secondary | ICD-10-CM | POA: Diagnosis not present

## 2017-10-01 DIAGNOSIS — E875 Hyperkalemia: Secondary | ICD-10-CM | POA: Diagnosis not present

## 2017-10-01 DIAGNOSIS — J939 Pneumothorax, unspecified: Secondary | ICD-10-CM | POA: Diagnosis not present

## 2017-10-01 DIAGNOSIS — R0989 Other specified symptoms and signs involving the circulatory and respiratory systems: Secondary | ICD-10-CM | POA: Diagnosis not present

## 2017-10-01 DIAGNOSIS — R140 Abdominal distension (gaseous): Secondary | ICD-10-CM | POA: Diagnosis not present

## 2017-10-01 DIAGNOSIS — E669 Obesity, unspecified: Secondary | ICD-10-CM | POA: Diagnosis not present

## 2017-10-01 DIAGNOSIS — J129 Viral pneumonia, unspecified: Secondary | ICD-10-CM | POA: Diagnosis not present

## 2017-10-01 DIAGNOSIS — Z4659 Encounter for fitting and adjustment of other gastrointestinal appliance and device: Secondary | ICD-10-CM

## 2017-10-01 DIAGNOSIS — Z7901 Long term (current) use of anticoagulants: Secondary | ICD-10-CM

## 2017-10-01 DIAGNOSIS — J1100 Influenza due to unidentified influenza virus with unspecified type of pneumonia: Secondary | ICD-10-CM | POA: Diagnosis not present

## 2017-10-01 DIAGNOSIS — J80 Acute respiratory distress syndrome: Secondary | ICD-10-CM | POA: Diagnosis not present

## 2017-10-01 DIAGNOSIS — E1165 Type 2 diabetes mellitus with hyperglycemia: Secondary | ICD-10-CM | POA: Diagnosis present

## 2017-10-01 DIAGNOSIS — R1312 Dysphagia, oropharyngeal phase: Secondary | ICD-10-CM | POA: Diagnosis not present

## 2017-10-01 DIAGNOSIS — R748 Abnormal levels of other serum enzymes: Secondary | ICD-10-CM | POA: Diagnosis not present

## 2017-10-01 DIAGNOSIS — Z9289 Personal history of other medical treatment: Secondary | ICD-10-CM

## 2017-10-01 HISTORY — DX: Unspecified atrial fibrillation: I48.91

## 2017-10-01 LAB — CBC WITH DIFFERENTIAL/PLATELET
BASOS ABS: 0 10*3/uL (ref 0.0–0.1)
Basophils Relative: 0 %
Eosinophils Absolute: 0.1 10*3/uL (ref 0.0–0.7)
Eosinophils Relative: 1 %
HEMATOCRIT: 46.7 % (ref 39.0–52.0)
HEMOGLOBIN: 15 g/dL (ref 13.0–17.0)
LYMPHS PCT: 12 %
Lymphs Abs: 1 10*3/uL (ref 0.7–4.0)
MCH: 29.2 pg (ref 26.0–34.0)
MCHC: 32.1 g/dL (ref 30.0–36.0)
MCV: 90.9 fL (ref 78.0–100.0)
MONOS PCT: 9 %
Monocytes Absolute: 0.8 10*3/uL (ref 0.1–1.0)
NEUTROS ABS: 6.6 10*3/uL (ref 1.7–7.7)
Neutrophils Relative %: 78 %
Platelets: 211 10*3/uL (ref 150–400)
RBC: 5.14 MIL/uL (ref 4.22–5.81)
RDW: 12.5 % (ref 11.5–15.5)
WBC: 8.5 10*3/uL (ref 4.0–10.5)

## 2017-10-01 LAB — I-STAT ARTERIAL BLOOD GAS, ED
ACID-BASE EXCESS: 3 mmol/L — AB (ref 0.0–2.0)
Bicarbonate: 31.2 mmol/L — ABNORMAL HIGH (ref 20.0–28.0)
O2 Saturation: 84 %
TCO2: 33 mmol/L — ABNORMAL HIGH (ref 22–32)
pCO2 arterial: 65.6 mmHg (ref 32.0–48.0)
pH, Arterial: 7.295 — ABNORMAL LOW (ref 7.350–7.450)
pO2, Arterial: 61 mmHg — ABNORMAL LOW (ref 83.0–108.0)

## 2017-10-01 LAB — GLUCOSE, CAPILLARY
GLUCOSE-CAPILLARY: 199 mg/dL — AB (ref 65–99)
GLUCOSE-CAPILLARY: 220 mg/dL — AB (ref 65–99)

## 2017-10-01 LAB — BLOOD GAS, ARTERIAL
ACID-BASE EXCESS: 3.7 mmol/L — AB (ref 0.0–2.0)
Bicarbonate: 30.2 mmol/L — ABNORMAL HIGH (ref 20.0–28.0)
DELIVERY SYSTEMS: POSITIVE
DRAWN BY: 249101
Expiratory PAP: 8
FIO2: 100
INSPIRATORY PAP: 18
O2 Saturation: 99.8 %
PCO2 ART: 68.5 mmHg — AB (ref 32.0–48.0)
Patient temperature: 98.6
pH, Arterial: 7.266 — ABNORMAL LOW (ref 7.350–7.450)
pO2, Arterial: 436 mmHg — ABNORMAL HIGH (ref 83.0–108.0)

## 2017-10-01 LAB — I-STAT CG4 LACTIC ACID, ED: Lactic Acid, Venous: 1.82 mmol/L (ref 0.5–1.9)

## 2017-10-01 LAB — BASIC METABOLIC PANEL
Anion gap: 13 (ref 5–15)
BUN: 5 mg/dL — AB (ref 6–20)
CHLORIDE: 95 mmol/L — AB (ref 101–111)
CO2: 26 mmol/L (ref 22–32)
Calcium: 9.2 mg/dL (ref 8.9–10.3)
Creatinine, Ser: 0.79 mg/dL (ref 0.61–1.24)
GFR calc Af Amer: >60 mL/min (ref >60)
GFR calc non Af Amer: >60 mL/min (ref >60)
GLUCOSE: 257 mg/dL — AB (ref 65–99)
POTASSIUM: 3.7 mmol/L (ref 3.5–5.1)
Sodium: 134 mmol/L — ABNORMAL LOW (ref 135–145)

## 2017-10-01 LAB — INFLUENZA PANEL BY PCR (TYPE A & B)
Influenza A By PCR: POSITIVE — AB
Influenza B By PCR: NEGATIVE

## 2017-10-01 LAB — PROTIME-INR
INR: 1.13
PROTHROMBIN TIME: 14.4 s (ref 11.4–15.2)

## 2017-10-01 LAB — TROPONIN I

## 2017-10-01 LAB — CBG MONITORING, ED: GLUCOSE-CAPILLARY: 222 mg/dL — AB (ref 65–99)

## 2017-10-01 LAB — APTT: aPTT: 35 seconds (ref 24–36)

## 2017-10-01 LAB — PROCALCITONIN: Procalcitonin: 0.12 ng/mL

## 2017-10-01 MED ORDER — VANCOMYCIN HCL IN DEXTROSE 1-5 GM/200ML-% IV SOLN: 1000.0000 mg | Freq: Once | INTRAVENOUS | Status: DC

## 2017-10-01 MED ORDER — BICTEGRAVIR-EMTRICITAB-TENOFOV 50-200-25 MG PO TABS: 1.0000 | ORAL_TABLET | Freq: Every day | ORAL | Status: DC

## 2017-10-01 MED ORDER — METHYLPREDNISOLONE SODIUM SUCC 125 MG IJ SOLR
60.0000 mg | Freq: Four times a day (QID) | INTRAMUSCULAR | Status: DC
  Administered 2017-10-01 – 2017-10-03 (×7): 60 mg via INTRAVENOUS
  Filled 2017-10-01: qty 0.96
  Filled 2017-10-01: qty 2
  Filled 2017-10-01 (×2): qty 0.96
  Filled 2017-10-01: qty 2
  Filled 2017-10-01: qty 0.96
  Filled 2017-10-01: qty 2
  Filled 2017-10-01: qty 0.96

## 2017-10-01 MED ORDER — VANCOMYCIN HCL IN DEXTROSE 1-5 GM/200ML-% IV SOLN
1000.0000 mg | Freq: Two times a day (BID) | INTRAVENOUS | Status: DC
  Administered 2017-10-02: 1000 mg via INTRAVENOUS
  Filled 2017-10-01: qty 200

## 2017-10-01 MED ORDER — SODIUM CHLORIDE 0.9% FLUSH
3.0000 mL | Freq: Two times a day (BID) | INTRAVENOUS | Status: DC
  Administered 2017-10-01 – 2017-10-29 (×32): 3 mL via INTRAVENOUS

## 2017-10-01 MED ORDER — IPRATROPIUM BROMIDE 0.02 % IN SOLN
0.5000 mg | RESPIRATORY_TRACT | Status: DC
  Administered 2017-10-01 – 2017-10-11 (×57): 0.5 mg via RESPIRATORY_TRACT
  Filled 2017-10-01 (×57): qty 2.5

## 2017-10-01 MED ORDER — SODIUM CHLORIDE 0.9 % IV SOLN
2500.0000 mg | Freq: Once | INTRAVENOUS | Status: DC
  Filled 2017-10-01: qty 2500

## 2017-10-01 MED ORDER — IBUPROFEN 400 MG PO TABS
600.0000 mg | ORAL_TABLET | Freq: Once | ORAL | Status: AC
  Administered 2017-10-01: 600 mg via ORAL
  Filled 2017-10-01: qty 1

## 2017-10-01 MED ORDER — SODIUM CHLORIDE 0.9 % IV SOLN: INTRAVENOUS | Status: DC

## 2017-10-01 MED ORDER — LEVALBUTEROL HCL 0.63 MG/3ML IN NEBU
0.6300 mg | INHALATION_SOLUTION | RESPIRATORY_TRACT | Status: DC
  Administered 2017-10-01 – 2017-10-11 (×58): 0.63 mg via RESPIRATORY_TRACT
  Filled 2017-10-01 (×111): qty 3

## 2017-10-01 MED ORDER — CEFEPIME HCL 1 G IJ SOLR
1.0000 g | Freq: Three times a day (TID) | INTRAMUSCULAR | Status: DC
  Administered 2017-10-01: 1 g via INTRAVENOUS
  Filled 2017-10-01 (×2): qty 1

## 2017-10-01 MED ORDER — SODIUM CHLORIDE 0.9 % IV SOLN: 2.0000 g | Freq: Once | INTRAVENOUS | Status: DC

## 2017-10-01 MED ORDER — ACETAMINOPHEN 650 MG RE SUPP
650.0000 mg | Freq: Four times a day (QID) | RECTAL | Status: DC | PRN
  Administered 2017-10-01: 650 mg via RECTAL
  Filled 2017-10-01: qty 1

## 2017-10-01 MED ORDER — PANTOPRAZOLE SODIUM 40 MG PO TBEC: 40.0000 mg | DELAYED_RELEASE_TABLET | Freq: Every day | ORAL | Status: DC

## 2017-10-01 MED ORDER — GUAIFENESIN-CODEINE 100-10 MG/5ML PO SOLN
10.0000 mL | Freq: Once | ORAL | Status: AC
  Administered 2017-10-01: 10 mL via ORAL
  Filled 2017-10-01: qty 10

## 2017-10-01 MED ORDER — SODIUM CHLORIDE 0.9 % IV SOLN: 1.0000 g | Freq: Three times a day (TID) | INTRAVENOUS | Status: DC

## 2017-10-01 MED ORDER — OSELTAMIVIR PHOSPHATE 75 MG PO CAPS
75.0000 mg | ORAL_CAPSULE | Freq: Two times a day (BID) | ORAL | Status: DC
  Administered 2017-10-01: 75 mg via ORAL
  Filled 2017-10-01 (×2): qty 1

## 2017-10-01 MED ORDER — IPRATROPIUM BROMIDE 0.02 % IN SOLN: 0.5000 mg | RESPIRATORY_TRACT | Status: DC

## 2017-10-01 MED ORDER — KETOROLAC TROMETHAMINE 15 MG/ML IJ SOLN
15.0000 mg | Freq: Four times a day (QID) | INTRAMUSCULAR | Status: DC | PRN
  Filled 2017-10-01: qty 1

## 2017-10-01 MED ORDER — ONDANSETRON HCL 4 MG/2ML IJ SOLN: 4.0000 mg | Freq: Four times a day (QID) | INTRAMUSCULAR | Status: DC | PRN

## 2017-10-01 MED ORDER — DILTIAZEM HCL 60 MG PO TABS
60.0000 mg | ORAL_TABLET | Freq: Four times a day (QID) | ORAL | Status: DC
  Filled 2017-10-01 (×3): qty 1

## 2017-10-01 MED ORDER — IPRATROPIUM BROMIDE 0.02 % IN SOLN: 0.5000 mg | Freq: Four times a day (QID) | RESPIRATORY_TRACT | Status: DC

## 2017-10-01 MED ORDER — ALBUTEROL (5 MG/ML) CONTINUOUS INHALATION SOLN
15.0000 mg/h | INHALATION_SOLUTION | RESPIRATORY_TRACT | Status: DC
  Administered 2017-10-01: 15 mg/h via RESPIRATORY_TRACT
  Filled 2017-10-01 (×2): qty 20

## 2017-10-01 MED ORDER — INSULIN ASPART 100 UNIT/ML ~~LOC~~ SOLN: 0.0000 [IU] | Freq: Every day | SUBCUTANEOUS | Status: DC

## 2017-10-01 MED ORDER — LEVALBUTEROL HCL 0.63 MG/3ML IN NEBU: 0.6300 mg | INHALATION_SOLUTION | Freq: Three times a day (TID) | RESPIRATORY_TRACT | Status: DC

## 2017-10-01 MED ORDER — BUDESONIDE 0.5 MG/2ML IN SUSP
0.5000 mg | Freq: Two times a day (BID) | RESPIRATORY_TRACT | Status: DC
  Administered 2017-10-01 – 2017-10-13 (×24): 0.5 mg via RESPIRATORY_TRACT
  Filled 2017-10-01 (×24): qty 2

## 2017-10-01 MED ORDER — SODIUM CHLORIDE 0.9 % IV SOLN
250.0000 mL | INTRAVENOUS | Status: DC | PRN
  Administered 2017-10-14: 500 mL via INTRAVENOUS
  Administered 2017-10-21 (×2): 250 mL via INTRAVENOUS

## 2017-10-01 MED ORDER — LEVOFLOXACIN 500 MG PO TABS
500.0000 mg | ORAL_TABLET | Freq: Once | ORAL | Status: DC
  Filled 2017-10-01 (×2): qty 1

## 2017-10-01 MED ORDER — ONDANSETRON HCL 4 MG PO TABS: 4.0000 mg | ORAL_TABLET | Freq: Four times a day (QID) | ORAL | Status: DC | PRN

## 2017-10-01 MED ORDER — ONDANSETRON HCL 4 MG/2ML IJ SOLN
4.0000 mg | Freq: Four times a day (QID) | INTRAMUSCULAR | Status: DC | PRN
  Administered 2017-10-14 – 2017-10-27 (×5): 4 mg via INTRAVENOUS
  Filled 2017-10-01 (×5): qty 2

## 2017-10-01 MED ORDER — VANCOMYCIN HCL 10 G IV SOLR
2500.0000 mg | Freq: Once | INTRAVENOUS | Status: AC
  Administered 2017-10-01: 2500 mg via INTRAVENOUS
  Filled 2017-10-01: qty 2500

## 2017-10-01 MED ORDER — ACETAMINOPHEN 325 MG PO TABS
650.0000 mg | ORAL_TABLET | Freq: Once | ORAL | Status: AC
  Administered 2017-10-01: 650 mg via ORAL
  Filled 2017-10-01: qty 2

## 2017-10-01 MED ORDER — ARFORMOTEROL TARTRATE 15 MCG/2ML IN NEBU
15.0000 ug | INHALATION_SOLUTION | Freq: Two times a day (BID) | RESPIRATORY_TRACT | Status: DC
  Administered 2017-10-01 – 2017-10-11 (×20): 15 ug via RESPIRATORY_TRACT
  Filled 2017-10-01 (×20): qty 2

## 2017-10-01 MED ORDER — FAMOTIDINE 20 MG IN NS 100 ML IVPB
20.0000 mg | Freq: Two times a day (BID) | INTRAVENOUS | Status: DC
  Administered 2017-10-02 – 2017-10-06 (×11): 20 mg via INTRAVENOUS
  Filled 2017-10-01 (×13): qty 100

## 2017-10-01 MED ORDER — ACETAMINOPHEN 325 MG PO TABS
650.0000 mg | ORAL_TABLET | Freq: Four times a day (QID) | ORAL | Status: DC | PRN
  Administered 2017-10-04: 650 mg via ORAL
  Filled 2017-10-01: qty 2

## 2017-10-01 MED ORDER — IPRATROPIUM BROMIDE 0.02 % IN SOLN: 0.5000 mg | RESPIRATORY_TRACT | Status: DC | PRN

## 2017-10-01 MED ORDER — PREDNISONE 20 MG PO TABS
40.0000 mg | ORAL_TABLET | Freq: Once | ORAL | Status: AC
  Administered 2017-10-01: 40 mg via ORAL
  Filled 2017-10-01: qty 2

## 2017-10-01 MED ORDER — APIXABAN 5 MG PO TABS
5.0000 mg | ORAL_TABLET | Freq: Two times a day (BID) | ORAL | Status: DC
  Administered 2017-10-02 – 2017-10-05 (×9): 5 mg via ORAL
  Filled 2017-10-01 (×10): qty 1

## 2017-10-01 MED ORDER — ALBUTEROL SULFATE (2.5 MG/3ML) 0.083% IN NEBU
5.0000 mg | INHALATION_SOLUTION | Freq: Once | RESPIRATORY_TRACT | Status: AC
  Administered 2017-10-01: 5 mg via RESPIRATORY_TRACT
  Filled 2017-10-01: qty 6

## 2017-10-01 MED ORDER — LEVALBUTEROL HCL 0.63 MG/3ML IN NEBU: 0.6300 mg | INHALATION_SOLUTION | RESPIRATORY_TRACT | Status: DC | PRN

## 2017-10-01 MED ORDER — INSULIN ASPART 100 UNIT/ML ~~LOC~~ SOLN
0.0000 [IU] | SUBCUTANEOUS | Status: DC
  Administered 2017-10-01 (×2): 5 [IU] via SUBCUTANEOUS
  Administered 2017-10-02 (×2): 15 [IU] via SUBCUTANEOUS
  Administered 2017-10-02: 3 [IU] via SUBCUTANEOUS
  Filled 2017-10-01: qty 1

## 2017-10-01 MED ORDER — METOPROLOL TARTRATE 5 MG/5ML IV SOLN
2.5000 mg | INTRAVENOUS | Status: DC | PRN
  Administered 2017-10-01 – 2017-10-08 (×6): 5 mg via INTRAVENOUS
  Administered 2017-10-09: 2.5 mg via INTRAVENOUS
  Administered 2017-10-10: 5 mg via INTRAVENOUS
  Filled 2017-10-01 (×9): qty 5

## 2017-10-01 MED ORDER — INSULIN ASPART 100 UNIT/ML ~~LOC~~ SOLN: 0.0000 [IU] | Freq: Three times a day (TID) | SUBCUTANEOUS | Status: DC

## 2017-10-01 MED ORDER — VANCOMYCIN HCL IN DEXTROSE 1-5 GM/200ML-% IV SOLN
1000.0000 mg | Freq: Two times a day (BID) | INTRAVENOUS | Status: DC
  Filled 2017-10-01: qty 200

## 2017-10-01 MED ORDER — METHYLPREDNISOLONE SODIUM SUCC 125 MG IJ SOLR: 60.0000 mg | Freq: Two times a day (BID) | INTRAMUSCULAR | Status: DC

## 2017-10-01 MED ORDER — MAGNESIUM SULFATE 2 GM/50ML IV SOLN
2.0000 g | Freq: Once | INTRAVENOUS | Status: AC
Start: 2017-10-01 — End: 2017-10-01
  Administered 2017-10-01: 2 g via INTRAVENOUS
  Filled 2017-10-01: qty 50

## 2017-10-01 MED ORDER — SODIUM CHLORIDE 0.9 % IV SOLN
2.0000 g | Freq: Three times a day (TID) | INTRAVENOUS | Status: DC
  Administered 2017-10-02 – 2017-10-08 (×19): 2 g via INTRAVENOUS
  Filled 2017-10-01 (×22): qty 2

## 2017-10-01 NOTE — Progress Notes (Signed)
ABG worsening respiratory acidosis. Vt in 350's on BIPAP 18/8. No leak on my exam of patient. Patient remains very lethargic but awakens to voice. Less WOB compared to earlier tonight in ER. Increased BIPAP to 20/10 with Vt increasing to 450 range. Will repeat ABG in 1 hour. If not improved will intubate.

## 2017-10-01 NOTE — ED Notes (Signed)
O2 89% on 4L [place]. O2 increased to 6L [place]. O2 increased to 90%. Will notify EDP

## 2017-10-01 NOTE — ED Provider Notes (Signed)
Patient placed in Quick Look pathway, seen and evaluated   Chief Complaint: SOB  HPI: Pt with past medical history of COPD, HIV, PAF on eliquis, presenting with 2-3 days of worsening shortness of breath and increasing oxygen requirement.  Patient states he normally wears 2 L, however has been needing 3 L. Has also used rescue inhaler without relief.  He states he has upper respiratory symptoms as well, including congestion, sore throat, and has been coughing.  Cough is productive of yellow phlegm.  Denies known fever. Reports he is compliant with anticoagulant and HIV medications. Denies cardiac hx, other than a-fib.  ROS: SOB, congestion, sore throat (one)  Physical Exam:   Gen: Non-toxic appearing  Neuro: Awake and Alert  Skin: Warm    Focused Exam: Increased work of breathing. Lung sounds diminished bilaterally. O2 sat about 90% at rest though drops to mid 80s when speaking.   Pt brought to room immediately from triage.  Initiation of care has begun. The patient has been counseled on the process, plan, and necessity for staying for the completion/evaluation, and the remainder of the medical screening examination    Chris [name], [place] N, PA-C 10/01/17 1518    Mancel BaleWentz, Elliott, MD 10/02/17 1218
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
## 2017-10-01 NOTE — Progress Notes (Signed)
Reported to Chris OliphantAmar Panchal that patient cannot receive scheduled Eliquis & Cardizem PO, which was scheduled state. Patient just arrives from [facility].  I am waiting for orders in regards to these medications.  Patient is on the BIPAP at 100% and patients sat is 96%.  Patient cannot tolerate to be off of the BIPAP at this time.

## 2017-10-01 NOTE — ED Notes (Signed)
Admitting at the bedside.  

## 2017-10-01 NOTE — ED Notes (Signed)
RT called. Will try high flow O2 on pt with humidified O2

## 2017-10-01 NOTE — ED Notes (Signed)
RT at bedside with bipap. No oral levaquin available in the ED at this time. This RN will speak to admitting regarding IV levaquin since pt will be NPO on bipap due to aspiration risk.

## 2017-10-01 NOTE — ED Notes (Signed)
EDP at bedside  

## 2017-10-01 NOTE — ED Notes (Signed)
Brett CanalesSteve, RT called for bipap for pt

## 2017-10-01 NOTE — Progress Notes (Signed)
Visited patients room Dr. Merlene PullingHammonds at bedside making Bipap changes due to last ABG CO2 results.  [facility]

## 2017-10-01 NOTE — ED Triage Notes (Signed)
Pt arrives via POV from home with SOB and increased O2 requirement. Normally wears O2 at 2L, now using 3L. Denies recent fever. Noted to productive cough. Noted to have cold symptoms, diminished lung sounds.

## 2017-10-01 NOTE — Consult Note (Signed)
Name: Chris [name]. MRN: [id number] DOB: April 25, 1966    ADMISSION DATE:  10/01/2017 CONSULTATION DATE:  10/01/17  REFERRING MD :  Irene Limbo  CHIEF COMPLAINT:  SOB   HISTORY OF PRESENT ILLNESS:  Chris Favors Camba [name]. is a 52 y.o. male with a PMH as outlined below including COPD and tobacco dependence for which he is followed as outpatient by Dr. Sherene Sires (09/22/17).  He also has hx of HIV that is well controlled, followed by Dr. Drue Second (last seen 09/25/17).  He presented to [facility] ED 10/01/17 with SOB that had gradually worsened over the 3 - 4 days.  Also had associated rhinorrhea, generalized aches, cough productive of white colored sputum, subjective fevers.  He uses 2L O2 PRN at home and over the past week had to increase it to 3L without much improvement.  He has been exposed to a grandson who has been sick, but otherwise no additional exposures.  No recent travel.  In ED, he was found to be hypoxic requiring HFNC.  He later had persistent increased WOB; therefore, was started on BiPAP.  CXR was negative, flu PCR pending.  He was admitted and treated as presumed COPD exacerbation with possible flu.  Due to need for BiPAP and concerns he may deteriorate further, PCCM was asked to see in consultation.  PAST MEDICAL HISTORY :   has a past medical history of Atrial fibrillation (HCC), COPD (chronic obstructive pulmonary disease) (HCC), Depression, Diabetes mellitus without complication (HCC), GERD (gastroesophageal reflux disease), and HIV disease (HCC).  has a past surgical history that includes Irrigation and debridement abscess (Right, 04/10/2013); Exploratory laparotomy; Incision and drainage abscess (Right, 02/20/2015); knee FRACTURE SURGERY  (Right); arm surgery (Left); Esophagogastroduodenoscopy (egd) with propofol (N/A, 09/03/2016); and Colonoscopy with propofol (N/A, 09/03/2016). Prior to Admission medications   Medication Sig Start Date End Date Taking? Authorizing Provider  ACCU-CHEK SOFTCLIX LANCETS  lancets Use as instructed Patient not taking: Reported on 10/01/2017 08/26/16   Judyann Munson, MD  acetaminophen-codeine (TYLENOL #3) 300-30 MG tablet Take 1 tablet by mouth every 8 (eight) hours as needed for moderate pain. 09/02/17 12/01/17  Quentin Angst, MD  albuterol (PROVENTIL HFA;VENTOLIN HFA) 108 (90 Base) MCG/ACT inhaler Inhale 2 puffs into the lungs every 4 (four) hours as needed for wheezing or shortness of breath (((PLAN B))). 09/02/17   Quentin Angst, MD  apixaban (ELIQUIS) 5 MG TABS tablet Take 1 tablet (5 mg total) by mouth 2 (two) times daily. 09/02/17   Quentin Angst, MD  atorvastatin (LIPITOR) 20 MG tablet Take 1 tablet (20 mg total) by mouth daily. 09/02/17   Quentin Angst, MD  bictegravir-emtricitabine-tenofovir AF (BIKTARVY) 50-200-25 MG TABS tablet Take 1 tablet by mouth daily. Patient taking differently: Take 1 tablet by mouth daily at 3 pm.  03/31/17   Judyann Munson, MD  budesonide-formoterol [facility]) 80-4.5 MCG/ACT inhaler Take 2 puffs first thing in am and then another 2 puffs about 12 hours later. 09/22/17   Nyoka Cowden, MD  clotrimazole (LOTRIMIN) 1 % cream Apply 1 application topically 2 (two) times daily. 08/26/16   Judyann Munson, MD  clotrimazole (MYCELEX) 10 MG troche Take 1 tablet (10 mg total) by mouth 5 (five) times daily. 09/22/17   Nyoka Cowden, MD  clotrimazole-betamethasone (LOTRISONE) cream Apply 1 application topically 2 (two) times daily. 09/21/17   Felecia Shelling, DPM  diltiazem (CARDIZEM CD) 360 MG 24 hr capsule Take 1 capsule (360 mg total) by mouth daily. 09/02/17  Quentin Angst, MD  furosemide (LASIX) 20 MG tablet Take 1 tablet (20 mg total) by mouth daily. 09/02/17   Jeanann Lewandowsky E, MD  glucose blood test strip Use as instructed Patient not taking: Reported on 10/01/2017 04/20/14   Quentin Angst, MD  ipratropium-albuterol (DUONEB) 0.5-2.5 (3) MG/3ML SOLN Take 3 mLs by nebulization every 6 (six) hours as  needed. 09/02/17   Quentin Angst, MD  metFORMIN (GLUCOPHAGE) 1000 MG tablet Take 1 tablet (1,000 mg total) by mouth 2 (two) times daily with a meal. 09/02/17   Jegede, Phylliss Blakes, MD  OXYGEN Place 2 L into the nose as needed (shortness of breath).     [provider]  pantoprazole (PROTONIX) 40 MG tablet Take 1 tablet (40 mg total) by mouth 2 (two) times daily before a meal. Patient taking differently: Take 40 mg by mouth 3 times/day as needed-between meals & bedtime.  09/02/17   Quentin Angst, MD  sildenafil (VIAGRA) 50 MG tablet Take 0.5 tablets (25 mg total) by mouth daily as needed for erectile dysfunction. 06/18/15   Judyann Munson, MD  SPIRIVA RESPIMAT 2.5 MCG/ACT AERS INHALE 2 PUFFS INTO THE LUNGS DAILY 08/10/17   Nyoka Cowden, MD  terbinafine (LAMISIL) 250 MG tablet Take 1 tablet (250 mg total) by mouth daily. 09/21/17   Felecia Shelling, DPM  Testosterone 12.5 MG/ACT (1%) GEL Place 1 Pump onto the skin daily. 09/25/17   Judyann Munson, MD  urea (CARMOL) 20 % cream Apply as needed topically. 06/23/17   Helane Gunther, DPM   Allergies  Allergen Reactions  . Bactrim [Sulfamethoxazole-Trimethoprim] Hives    FAMILY HISTORY:  family history includes Asthma in his maternal aunt and maternal uncle; Breast cancer in his maternal aunt; COPD in his maternal uncle; Diabetes in his maternal aunt, maternal grandmother, maternal uncle, and maternal uncle; Emphysema in his maternal uncle; Heart disease in his maternal aunt and maternal uncle; Throat cancer in his maternal grandmother; Throat cancer (age of onset: 90) in his father. SOCIAL HISTORY:  reports that he has been smoking cigarettes.  He has a 4.00 pack-year smoking history. he has never used smokeless tobacco. He reports that he does not drink alcohol or use drugs.  REVIEW OF SYSTEMS:   All negative; except for those that are bolded, which indicate positives.  Constitutional: weight loss, weight gain, night sweats,  fevers, chills, fatigue, weakness.  HEENT: headaches, sore throat, sneezing, nasal congestion, post nasal drip, difficulty swallowing, tooth/dental problems, visual complaints, visual changes, ear aches. Neuro: difficulty with speech, weakness, numbness, ataxia. CV:  chest pain, orthopnea, PND, swelling in lower extremities, dizziness, palpitations, syncope.  Resp: cough, hemoptysis, dyspnea, wheezing. GI: heartburn, indigestion, abdominal pain, nausea, vomiting, diarrhea, constipation, change in bowel habits, loss of appetite, hematemesis, melena, hematochezia.  GU: dysuria, change in color of urine, urgency or frequency, flank pain, hematuria. MSK: joint pain or swelling, decreased range of motion. Psych: change in mood or affect, depression, anxiety, suicidal ideations, homicidal ideations. Skin: rash, itching, bruising.   SUBJECTIVE:  On BiPAP, able to communicate appropriately.  Feels sleepy.  VITAL SIGNS: Temp:  [100.8 F (38.2 C)-101.9 F (38.8 C)] 100.8 F (38.2 C) (02/21 1918) Pulse Rate:  [112-125] 115 (02/21 1905) Resp:  [23-41] 32 (02/21 1905) BP: (131-162)/(56-100) 131/72 (02/21 1851) SpO2:  [87 %-96 %] 95 % (02/21 1905)  PHYSICAL EXAMINATION: General: Adult male, resting in stretcher, in NAD. Neuro: Somnolent, but arouses easily to voice.  Follows basic commands. HEENT: [place]/AT.  EOMI, sclerae anicteric. Cardiovascular: RRR, no M/R/G.  Lungs: Respirations shallow.  Faint wheezes posteriorly.   Abdomen: BS x 4, soft, NT/ND.  Musculoskeletal: No gross deformities, trace pedal edema.  Skin: Intact, warm, no rashes.   Recent Labs  Lab 09/25/17 1125 10/01/17 1459  NA 135 134*  K 3.7 3.7  CL 97* 95*  CO2 29 26  BUN 6* 5*  CREATININE 0.80 0.79  GLUCOSE 314* 257*   Recent Labs  Lab 09/25/17 1125 10/01/17 1459  HGB 13.6 15.0  HCT 42.8 46.7  WBC 7.6 8.5  PLT 234 211   Dg Chest 2 View  Result Date: 10/01/2017 CLINICAL DATA:  Cough and shortness of breath.   Chest pain EXAM: CHEST  2 VIEW COMPARISON:  Chest CT July 23, 2017; chest radiograph July 23, 2017 FINDINGS: There is bibasilar atelectatic change. There is no frank edema or consolidation. Heart is mildly enlarged with pulmonary vascularity within normal limits. No adenopathy. No bone lesions. No pneumothorax. IMPRESSION: Mild cardiomegaly. Bibasilar atelectasis. No frank edema or consolidation. Electronically Signed   By: Bretta BangWilliam  Woodruff III M.D.   On: 10/01/2017 16:13    STUDIES:  CXR 2/21 > no acute process.  SIGNIFICANT EVENTS  2/21 > admit.  ASSESSMENT / PLAN:  Acute on chronic hypoxic respiratory failure with acute hypercapnia. CODP with acute exacerbation. Possible flu. Tobacco dependence. Plan: Continue BiPAP for now, would leave on through the night. Close monitoring in ICU, if deteriorates then will require intubation. When off BiPAP, continue supplemental O2 as needed to maintain SpO2 > 90%. Continue broad spectrum abx including tamiflu until flu PCR returns. Continue BD's, steroids. Bronchial hygiene. Tobacco cessation counseling. CXR intermittently.  Hx A.fib, HTN. Plan: Continue eliquis. Using short acting CCB in lieu of preadmission long acting. Hold preadmission furosemide.   Hx DM. Plan: SSI. Hold preadmission metformin.  Hx HIV - well controlled, followed by Dr. Drue SecondSnider. Plan: Continue preadmission Biktarvy. F/u with Dr. Drue SecondSnider / ID as outpatient.  Will admit to ICU overnight for closer observation given respiratory status despite BiPAP.  Wife updated at bedside by Dr. Merlene PullingHammonds.   Rutherford Guysahul Reg Bircher, [place] - [facility] Pager: [phone]  or [phone] 10/01/2017, 7:55 PM

## 2017-10-01 NOTE — Progress Notes (Signed)
ABG: PH 7.32 with a PCO2 of 60.6 and a bicarb of 31.2 which is suggestive of mostly compensated chronic hypercarbia, PO2 is low though at 54 and O2 sat of 84%.  RT reports patient is breathing easily now and is alert.  We will continue BiPAP.  Await formal PCCM evaluation.  RT to watch patient closely and will continue at this juncture with plans to admit to stepdown.  Junious SilkAllison Caeden Foots, ANP

## 2017-10-01 NOTE — Progress Notes (Addendum)
Pharmacy Antibiotic Note  Chris FavorsJerome A Mccreadie [name] is a 52 y.o. male admitted on 10/01/2017 with pneumonia.  Pharmacy has been consulted for vancomycin and cefepime dosing.  SCr stable. Normalized CrCl > 111000ml/min.  Plan: Start cefepime 2g IV Q8h Give vancomycin 2g IV x 1, then start vancomycin 1g IV Q12h Monitor clinical picture, renal function, VT prn F/U C&S, abx deescalation / LOT    Temp (24hrs), Avg:101.9 F (38.8 C), Min:101.9 F (38.8 C), Max:101.9 F (38.8 C)  Recent Labs  Lab 09/25/17 1125 10/01/17 1459 10/01/17 1515  WBC 7.6 8.5  --   CREATININE 0.80 0.79  --   LATICACIDVEN  --   --  1.82    Estimated Creatinine Clearance: 138.9 mL/min (by C-G formula based on SCr of 0.79 mg/dL).    Allergies  Allergen Reactions  . Bactrim [Sulfamethoxazole-Trimethoprim] Hives    Thank you for allowing pharmacy to be a part of this patient's care.  Armandina StammerBATCHELDER,Chris [name] 10/01/2017 6:42 PM

## 2017-10-01 NOTE — ED Notes (Signed)
Message sent to pharmacy for levaquin and levalbuterol

## 2017-10-01 NOTE — ED Notes (Signed)
RT at bedside.

## 2017-10-01 NOTE — ED Notes (Signed)
Patient transported to X-ray 

## 2017-10-01 NOTE — ED Notes (Addendum)
No answer in waiting room when called x 3. Pt not in XR

## 2017-10-02 ENCOUNTER — Inpatient Hospital Stay (HOSPITAL_COMMUNITY): Payer: Medicare HMO

## 2017-10-02 DIAGNOSIS — J09X1 Influenza due to identified novel influenza A virus with pneumonia: Secondary | ICD-10-CM

## 2017-10-02 LAB — POCT I-STAT 3, VENOUS BLOOD GAS (G3P V)
Acid-base deficit: 3 mmol/L — ABNORMAL HIGH (ref 0.0–2.0)
Bicarbonate: 26 mmol/L (ref 20.0–28.0)
O2 Saturation: 78 %
PH VEN: 7.22 — AB (ref 7.250–7.430)
PO2 VEN: 52 mmHg — AB (ref 32.0–45.0)
Patient temperature: 98.6
TCO2: 28 mmol/L (ref 22–32)
pCO2, Ven: 63.6 mmHg — ABNORMAL HIGH (ref 44.0–60.0)

## 2017-10-02 LAB — BASIC METABOLIC PANEL
ANION GAP: 11 (ref 5–15)
BUN: 24 mg/dL — AB (ref 6–20)
CHLORIDE: 97 mmol/L — AB (ref 101–111)
CO2: 22 mmol/L (ref 22–32)
Calcium: 7.7 mg/dL — ABNORMAL LOW (ref 8.9–10.3)
Creatinine, Ser: 1.81 mg/dL — ABNORMAL HIGH (ref 0.61–1.24)
GFR calc Af Amer: 48 mL/min — ABNORMAL LOW (ref >60)
GFR, EST NON AFRICAN AMERICAN: 42 mL/min — AB (ref >60)
GLUCOSE: 428 mg/dL — AB (ref 65–99)
POTASSIUM: 5.1 mmol/L (ref 3.5–5.1)
Sodium: 130 mmol/L — ABNORMAL LOW (ref 135–145)

## 2017-10-02 LAB — BLOOD GAS, ARTERIAL
ACID-BASE EXCESS: 0.6 mmol/L (ref 0.0–2.0)
Acid-Base Excess: 0.4 mmol/L (ref 0.0–2.0)
Acid-Base Excess: 3.8 mmol/L — ABNORMAL HIGH (ref 0.0–2.0)
BICARBONATE: 26.4 mmol/L (ref 20.0–28.0)
BICARBONATE: 28.7 mmol/L — AB (ref 20.0–28.0)
BICARBONATE: 30.3 mmol/L — AB (ref 20.0–28.0)
DRAWN BY: 517021
Delivery systems: POSITIVE
Drawn by: 276271
Drawn by: 249101
Expiratory PAP: 10
FIO2: 100
FIO2: 80
FIO2: 50
Inspiratory PAP: 20
LHR: 18 {breaths}/min
LHR: 24 {breaths}/min
MECHVT: 570 mL
MECHVT: 570 mL
O2 SAT: 94.3 %
O2 SAT: 96 %
O2 Saturation: 98.5 %
PATIENT TEMPERATURE: 98.6
PATIENT TEMPERATURE: 98.6
PCO2 ART: 58.6 mmHg — AB (ref 32.0–48.0)
PEEP: 5 cmH2O
PEEP: 5 cmH2O
PH ART: 7.276 — AB (ref 7.350–7.450)
PO2 ART: 81.9 mmHg — AB (ref 83.0–108.0)
PO2 ART: 96.5 mmHg (ref 83.0–108.0)
Patient temperature: 98.6
pCO2 arterial: 85.5 mmHg (ref 32.0–48.0)
pCO2 arterial: 69.3 mmHg (ref 32.0–48.0)
pH, Arterial: 7.152 — CL (ref 7.350–7.450)
pH, Arterial: 7.264 — ABNORMAL LOW (ref 7.350–7.450)
pO2, Arterial: 169 mmHg — ABNORMAL HIGH (ref 83.0–108.0)

## 2017-10-02 LAB — RESPIRATORY PANEL BY PCR
ADENOVIRUS-RVPPCR: NOT DETECTED
Bordetella pertussis: NOT DETECTED
CORONAVIRUS HKU1-RVPPCR: NOT DETECTED
CORONAVIRUS NL63-RVPPCR: NOT DETECTED
CORONAVIRUS OC43-RVPPCR: NOT DETECTED
Chlamydophila pneumoniae: NOT DETECTED
Coronavirus 229E: NOT DETECTED
INFLUENZA A H3-RVPPCR: DETECTED — AB
Influenza B: NOT DETECTED
MYCOPLASMA PNEUMONIAE-RVPPCR: NOT DETECTED
Metapneumovirus: NOT DETECTED
PARAINFLUENZA VIRUS 1-RVPPCR: NOT DETECTED
PARAINFLUENZA VIRUS 4-RVPPCR: NOT DETECTED
Parainfluenza Virus 2: NOT DETECTED
Parainfluenza Virus 3: NOT DETECTED
Respiratory Syncytial Virus: NOT DETECTED
Rhinovirus / Enterovirus: NOT DETECTED

## 2017-10-02 LAB — POCT I-STAT 3, ART BLOOD GAS (G3+)
ACID-BASE DEFICIT: 2 mmol/L (ref 0.0–2.0)
Acid-base deficit: 2 mmol/L (ref 0.0–2.0)
BICARBONATE: 26.1 mmol/L (ref 20.0–28.0)
Bicarbonate: 27.2 mmol/L (ref 20.0–28.0)
O2 SAT: 96 %
O2 Saturation: 94 %
PCO2 ART: 56.7 mmHg — AB (ref 32.0–48.0)
PCO2 ART: 64.3 mmHg — AB (ref 32.0–48.0)
PH ART: 7.235 — AB (ref 7.350–7.450)
PO2 ART: 98 mmHg (ref 83.0–108.0)
PO2 ART: 86 mmHg (ref 83.0–108.0)
Patient temperature: 98.6
Patient temperature: 98.6
TCO2: 28 mmol/L (ref 22–32)
TCO2: 29 mmol/L (ref 22–32)
pH, Arterial: 7.271 — ABNORMAL LOW (ref 7.350–7.450)

## 2017-10-02 LAB — URINALYSIS, ROUTINE W REFLEX MICROSCOPIC
BILIRUBIN URINE: NEGATIVE
Glucose, UA: >=500 mg/dL — AB
Ketones, ur: NEGATIVE mg/dL
LEUKOCYTES UA: NEGATIVE
Nitrite: NEGATIVE
Protein, ur: 100 mg/dL — AB
SPECIFIC GRAVITY, URINE: 1.025 (ref 1.005–1.030)
pH: 5 (ref 5.0–8.0)

## 2017-10-02 LAB — CBC
HEMATOCRIT: 43.2 % (ref 39.0–52.0)
HEMOGLOBIN: 13.4 g/dL (ref 13.0–17.0)
MCH: 29.1 pg (ref 26.0–34.0)
MCHC: 31 g/dL (ref 30.0–36.0)
MCV: 93.7 fL (ref 78.0–100.0)
Platelets: 212 10*3/uL (ref 150–400)
RBC: 4.61 MIL/uL (ref 4.22–5.81)
RDW: 12.7 % (ref 11.5–15.5)
WBC: 9.6 10*3/uL (ref 4.0–10.5)

## 2017-10-02 LAB — COMPREHENSIVE METABOLIC PANEL
ALBUMIN: 3.4 g/dL — AB (ref 3.5–5.0)
ALK PHOS: 44 U/L (ref 38–126)
ALT: 18 U/L (ref 17–63)
ANION GAP: 10 (ref 5–15)
AST: 23 U/L (ref 15–41)
BILIRUBIN TOTAL: 0.8 mg/dL (ref 0.3–1.2)
BUN: 15 mg/dL (ref 6–20)
CALCIUM: 8.1 mg/dL — AB (ref 8.9–10.3)
CO2: 26 mmol/L (ref 22–32)
Chloride: 96 mmol/L — ABNORMAL LOW (ref 101–111)
Creatinine, Ser: 1.46 mg/dL — ABNORMAL HIGH (ref 0.61–1.24)
GFR calc Af Amer: >60 mL/min (ref >60)
GFR, EST NON AFRICAN AMERICAN: 54 mL/min — AB (ref >60)
GLUCOSE: 342 mg/dL — AB (ref 65–99)
Potassium: 6.2 mmol/L — ABNORMAL HIGH (ref 3.5–5.1)
Sodium: 132 mmol/L — ABNORMAL LOW (ref 135–145)
TOTAL PROTEIN: 7.6 g/dL (ref 6.5–8.1)

## 2017-10-02 LAB — MRSA PCR SCREENING: MRSA by PCR: NEGATIVE

## 2017-10-02 LAB — STREP PNEUMONIAE URINARY ANTIGEN: STREP PNEUMO URINARY ANTIGEN: NEGATIVE

## 2017-10-02 LAB — GLUCOSE, CAPILLARY
GLUCOSE-CAPILLARY: 279 mg/dL — AB (ref 65–99)
Glucose-Capillary: 230 mg/dL — ABNORMAL HIGH (ref 65–99)
Glucose-Capillary: 262 mg/dL — ABNORMAL HIGH (ref 65–99)
Glucose-Capillary: 351 mg/dL — ABNORMAL HIGH (ref 65–99)
Glucose-Capillary: 387 mg/dL — ABNORMAL HIGH (ref 65–99)

## 2017-10-02 LAB — CREATININE, URINE, RANDOM: Creatinine, Urine: 110.23 mg/dL

## 2017-10-02 LAB — MAGNESIUM: Magnesium: 2.1 mg/dL (ref 1.7–2.4)

## 2017-10-02 LAB — PHOSPHORUS: Phosphorus: 6.7 mg/dL — ABNORMAL HIGH (ref 2.5–4.6)

## 2017-10-02 LAB — SODIUM, URINE, RANDOM: Sodium, Ur: 27 mmol/L

## 2017-10-02 LAB — TRIGLYCERIDES: Triglycerides: 79 mg/dL (ref <150)

## 2017-10-02 MED ORDER — ORAL CARE MOUTH RINSE
15.0000 mL | Freq: Four times a day (QID) | OROMUCOSAL | Status: DC
  Administered 2017-10-02 – 2017-10-06 (×19): 15 mL via OROMUCOSAL

## 2017-10-02 MED ORDER — ETOMIDATE 2 MG/ML IV SOLN
30.0000 mg | Freq: Once | INTRAVENOUS | Status: AC
  Administered 2017-10-02: 30 mg via INTRAVENOUS

## 2017-10-02 MED ORDER — MIDAZOLAM HCL 2 MG/2ML IJ SOLN
INTRAMUSCULAR | Status: AC
  Administered 2017-10-02: 2 mg
  Filled 2017-10-02: qty 2

## 2017-10-02 MED ORDER — SUCCINYLCHOLINE CHLORIDE 20 MG/ML IJ SOLN
200.0000 mg | Freq: Once | INTRAMUSCULAR | Status: AC
  Administered 2017-10-02: 200 mg via INTRAVENOUS

## 2017-10-02 MED ORDER — INSULIN GLARGINE 100 UNIT/ML ~~LOC~~ SOLN
20.0000 [IU] | Freq: Every day | SUBCUTANEOUS | Status: DC
  Administered 2017-10-02: 20 [IU] via SUBCUTANEOUS
  Filled 2017-10-02 (×2): qty 0.2

## 2017-10-02 MED ORDER — FLUCONAZOLE 100MG IVPB
100.0000 mg | INTRAVENOUS | Status: DC
  Administered 2017-10-03 – 2017-10-07 (×5): 100 mg via INTRAVENOUS
  Filled 2017-10-02 (×6): qty 50

## 2017-10-02 MED ORDER — FENTANYL CITRATE (PF) 100 MCG/2ML IJ SOLN
INTRAMUSCULAR | Status: AC
  Administered 2017-10-02: 100 ug
  Filled 2017-10-02: qty 2

## 2017-10-02 MED ORDER — FLUCONAZOLE IN SODIUM CHLORIDE 200-0.9 MG/100ML-% IV SOLN
200.0000 mg | Freq: Once | INTRAVENOUS | Status: AC
Start: 2017-10-02 — End: 2017-10-02
  Administered 2017-10-02: 200 mg via INTRAVENOUS
  Filled 2017-10-02: qty 100

## 2017-10-02 MED ORDER — CHLORHEXIDINE GLUCONATE 0.12% ORAL RINSE (MEDLINE KIT)
15.0000 mL | Freq: Two times a day (BID) | OROMUCOSAL | Status: DC
  Administered 2017-10-02 – 2017-10-30 (×53): 15 mL via OROMUCOSAL

## 2017-10-02 MED ORDER — MIDAZOLAM HCL 2 MG/2ML IJ SOLN
2.0000 mg | INTRAMUSCULAR | Status: AC | PRN
  Administered 2017-10-02 – 2017-10-05 (×3): 2 mg via INTRAVENOUS
  Filled 2017-10-02 (×3): qty 2

## 2017-10-02 MED ORDER — SODIUM CHLORIDE 0.9 % IV BOLUS (SEPSIS)
1000.0000 mL | Freq: Once | INTRAVENOUS | Status: AC
  Administered 2017-10-02: 1000 mL via INTRAVENOUS

## 2017-10-02 MED ORDER — VITAL HIGH PROTEIN PO LIQD
1000.0000 mL | ORAL | Status: DC
  Administered 2017-10-02 – 2017-10-08 (×7): 1000 mL

## 2017-10-02 MED ORDER — FENTANYL 2500MCG IN NS 250ML (10MCG/ML) PREMIX INFUSION
25.0000 ug/h | INTRAVENOUS | Status: DC
  Administered 2017-10-02: 50 ug/h via INTRAVENOUS
  Administered 2017-10-03: 300 ug/h via INTRAVENOUS
  Administered 2017-10-03: 100 ug/h via INTRAVENOUS
  Administered 2017-10-04 (×2): 300 ug/h via INTRAVENOUS
  Filled 2017-10-02 (×5): qty 250

## 2017-10-02 MED ORDER — SODIUM CHLORIDE 0.9 % IV SOLN
INTRAVENOUS | Status: DC
  Administered 2017-10-02 – 2017-10-05 (×4): via INTRAVENOUS

## 2017-10-02 MED ORDER — EMTRICITABINE-TENOFOVIR AF 200-25 MG PO TABS
1.0000 | ORAL_TABLET | Freq: Every day | ORAL | Status: DC
  Administered 2017-10-02 – 2017-10-10 (×9): 1
  Filled 2017-10-02 (×9): qty 1

## 2017-10-02 MED ORDER — PRO-STAT SUGAR FREE PO LIQD
60.0000 mL | Freq: Three times a day (TID) | ORAL | Status: DC
  Administered 2017-10-02 – 2017-10-08 (×18): 60 mL via ORAL
  Filled 2017-10-02 (×21): qty 60

## 2017-10-02 MED ORDER — OSELTAMIVIR PHOSPHATE 6 MG/ML PO SUSR
75.0000 mg | Freq: Two times a day (BID) | ORAL | Status: DC
  Administered 2017-10-02 – 2017-10-03 (×3): 75 mg
  Filled 2017-10-02 (×3): qty 12.5

## 2017-10-02 MED ORDER — FENTANYL BOLUS VIA INFUSION
50.0000 ug | INTRAVENOUS | Status: DC | PRN
  Administered 2017-10-09 – 2017-10-21 (×15): 50 ug via INTRAVENOUS
  Filled 2017-10-02: qty 50

## 2017-10-02 MED ORDER — DOCUSATE SODIUM 50 MG/5ML PO LIQD
100.0000 mg | Freq: Two times a day (BID) | ORAL | Status: DC | PRN
  Administered 2017-10-04 – 2017-10-11 (×3): 100 mg
  Filled 2017-10-02 (×3): qty 10

## 2017-10-02 MED ORDER — PROPOFOL 1000 MG/100ML IV EMUL
0.0000 ug/kg/min | INTRAVENOUS | Status: DC
  Administered 2017-10-02 (×2): 30 ug/kg/min via INTRAVENOUS
  Administered 2017-10-02: 40 ug/kg/min via INTRAVENOUS
  Administered 2017-10-02: 15 ug/kg/min via INTRAVENOUS
  Administered 2017-10-02: 50 ug/kg/min via INTRAVENOUS
  Administered 2017-10-02: 30 ug/kg/min via INTRAVENOUS
  Administered 2017-10-03 – 2017-10-05 (×18): 50 ug/kg/min via INTRAVENOUS
  Filled 2017-10-02 (×25): qty 100

## 2017-10-02 MED ORDER — MIDAZOLAM HCL 2 MG/2ML IJ SOLN
2.0000 mg | INTRAMUSCULAR | Status: DC | PRN
  Administered 2017-10-04 – 2017-10-16 (×18): 2 mg via INTRAVENOUS
  Filled 2017-10-02 (×20): qty 2

## 2017-10-02 MED ORDER — DOLUTEGRAVIR SODIUM 50 MG PO TABS
50.0000 mg | ORAL_TABLET | Freq: Every day | ORAL | Status: DC
  Administered 2017-10-02 – 2017-10-30 (×29): 50 mg via ORAL
  Filled 2017-10-02 (×29): qty 1

## 2017-10-02 MED ORDER — FENTANYL CITRATE (PF) 100 MCG/2ML IJ SOLN: 50.0000 ug | Freq: Once | INTRAMUSCULAR | Status: AC

## 2017-10-02 MED ORDER — INSULIN ASPART 100 UNIT/ML ~~LOC~~ SOLN
0.0000 [IU] | SUBCUTANEOUS | Status: DC
  Administered 2017-10-02: 7 [IU] via SUBCUTANEOUS
  Administered 2017-10-02: 11 [IU] via SUBCUTANEOUS
  Administered 2017-10-02: 20 [IU] via SUBCUTANEOUS
  Administered 2017-10-02: 7 [IU] via SUBCUTANEOUS
  Administered 2017-10-03 (×5): 11 [IU] via SUBCUTANEOUS
  Administered 2017-10-04 (×3): 15 [IU] via SUBCUTANEOUS
  Administered 2017-10-04 (×2): 11 [IU] via SUBCUTANEOUS
  Administered 2017-10-04: 7 [IU] via SUBCUTANEOUS
  Administered 2017-10-05: 11 [IU] via SUBCUTANEOUS
  Administered 2017-10-05: 15 [IU] via SUBCUTANEOUS
  Administered 2017-10-05: 11 [IU] via SUBCUTANEOUS
  Administered 2017-10-05 (×2): 7 [IU] via SUBCUTANEOUS
  Administered 2017-10-05: 11 [IU] via SUBCUTANEOUS
  Administered 2017-10-06: 4 [IU] via SUBCUTANEOUS
  Administered 2017-10-06: 7 [IU] via SUBCUTANEOUS
  Administered 2017-10-06: 15 [IU] via SUBCUTANEOUS
  Administered 2017-10-06: 4 [IU] via SUBCUTANEOUS
  Administered 2017-10-06: 11 [IU] via SUBCUTANEOUS
  Administered 2017-10-06: 15 [IU] via SUBCUTANEOUS
  Administered 2017-10-07: 11 [IU] via SUBCUTANEOUS
  Administered 2017-10-07: 15 [IU] via SUBCUTANEOUS
  Administered 2017-10-07: 20 [IU] via SUBCUTANEOUS
  Administered 2017-10-07: 15 [IU] via SUBCUTANEOUS
  Administered 2017-10-07: 4 [IU] via SUBCUTANEOUS
  Administered 2017-10-07: 11 [IU] via SUBCUTANEOUS
  Administered 2017-10-08: 3 [IU] via SUBCUTANEOUS
  Administered 2017-10-08 – 2017-10-09 (×4): 4 [IU] via SUBCUTANEOUS
  Administered 2017-10-09: 3 [IU] via SUBCUTANEOUS
  Administered 2017-10-09: 7 [IU] via SUBCUTANEOUS
  Administered 2017-10-10: 4 [IU] via SUBCUTANEOUS
  Administered 2017-10-10: 7 [IU] via SUBCUTANEOUS
  Administered 2017-10-10 (×2): 4 [IU] via SUBCUTANEOUS
  Administered 2017-10-10: 7 [IU] via SUBCUTANEOUS
  Administered 2017-10-10: 4 [IU] via SUBCUTANEOUS
  Administered 2017-10-11 (×2): 7 [IU] via SUBCUTANEOUS
  Administered 2017-10-11 (×2): 4 [IU] via SUBCUTANEOUS
  Administered 2017-10-11: 7 [IU] via SUBCUTANEOUS
  Administered 2017-10-11: 4 [IU] via SUBCUTANEOUS
  Administered 2017-10-12: 7 [IU] via SUBCUTANEOUS
  Administered 2017-10-12 (×2): 11 [IU] via SUBCUTANEOUS
  Administered 2017-10-12: 15 [IU] via SUBCUTANEOUS
  Administered 2017-10-12 (×2): 7 [IU] via SUBCUTANEOUS
  Administered 2017-10-12: 11 [IU] via SUBCUTANEOUS
  Administered 2017-10-13 (×2): 4 [IU] via SUBCUTANEOUS
  Administered 2017-10-13: 7 [IU] via SUBCUTANEOUS
  Administered 2017-10-13: 4 [IU] via SUBCUTANEOUS
  Administered 2017-10-13: 7 [IU] via SUBCUTANEOUS
  Administered 2017-10-14: 4 [IU] via SUBCUTANEOUS
  Administered 2017-10-14: 3 [IU] via SUBCUTANEOUS
  Administered 2017-10-14 (×2): 4 [IU] via SUBCUTANEOUS
  Administered 2017-10-14: 3 [IU] via SUBCUTANEOUS
  Administered 2017-10-14: 4 [IU] via SUBCUTANEOUS
  Administered 2017-10-14: 7 [IU] via SUBCUTANEOUS
  Administered 2017-10-15: 4 [IU] via SUBCUTANEOUS
  Administered 2017-10-15: 7 [IU] via SUBCUTANEOUS
  Administered 2017-10-15: 3 [IU] via SUBCUTANEOUS
  Administered 2017-10-15 (×2): 4 [IU] via SUBCUTANEOUS
  Administered 2017-10-16 (×2): 7 [IU] via SUBCUTANEOUS
  Administered 2017-10-16 (×2): 4 [IU] via SUBCUTANEOUS
  Administered 2017-10-16: 7 [IU] via SUBCUTANEOUS
  Administered 2017-10-16 – 2017-10-17 (×2): 3 [IU] via SUBCUTANEOUS
  Administered 2017-10-17: 4 [IU] via SUBCUTANEOUS
  Administered 2017-10-17: 7 [IU] via SUBCUTANEOUS
  Administered 2017-10-17: 3 [IU] via SUBCUTANEOUS
  Administered 2017-10-17: 4 [IU] via SUBCUTANEOUS
  Administered 2017-10-17: 7 [IU] via SUBCUTANEOUS
  Administered 2017-10-17 – 2017-10-18 (×2): 3 [IU] via SUBCUTANEOUS
  Administered 2017-10-18: 4 [IU] via SUBCUTANEOUS
  Administered 2017-10-18 (×2): 3 [IU] via SUBCUTANEOUS
  Administered 2017-10-19: 4 [IU] via SUBCUTANEOUS
  Administered 2017-10-19 – 2017-10-21 (×9): 3 [IU] via SUBCUTANEOUS
  Administered 2017-10-21: 4 [IU] via SUBCUTANEOUS
  Administered 2017-10-22 – 2017-10-26 (×5): 3 [IU] via SUBCUTANEOUS
  Administered 2017-10-26: 7 [IU] via SUBCUTANEOUS
  Administered 2017-10-26: 3 [IU] via SUBCUTANEOUS
  Administered 2017-10-26: 4 [IU] via SUBCUTANEOUS
  Administered 2017-10-26: 3 [IU] via SUBCUTANEOUS
  Administered 2017-10-26 – 2017-10-27 (×2): 7 [IU] via SUBCUTANEOUS
  Administered 2017-10-27 – 2017-10-28 (×4): 4 [IU] via SUBCUTANEOUS
  Administered 2017-10-29: 7 [IU] via SUBCUTANEOUS
  Administered 2017-10-29: 4 [IU] via SUBCUTANEOUS
  Administered 2017-10-29 (×3): 3 [IU] via SUBCUTANEOUS
  Administered 2017-10-30: 4 [IU] via SUBCUTANEOUS
  Administered 2017-10-30: 3 [IU] via SUBCUTANEOUS
  Administered 2017-10-30: 4 [IU] via SUBCUTANEOUS
  Administered 2017-10-30: 7 [IU] via SUBCUTANEOUS

## 2017-10-02 MED ORDER — FENTANYL 2500MCG IN NS 250ML (10MCG/ML) PREMIX INFUSION: 0.0000 ug/h | INTRAVENOUS | Status: DC

## 2017-10-02 NOTE — Procedures (Signed)
Endotracheal Intubation Procedure Note Indication for endotracheal intubation: impending respiratory failure Sedation: etomidate, fentanyl and midazolam Paralytic: succinylcholine Equipment: Macintosh 3 laryngoscope blade and 7.13m cuffed endotracheal tube; secured 25cm at the lip Cricoid Pressure: yes Number of attempts: 2 Initial attempt at intubation with Mac 3 blade revealed very anterior cords. Switched to glide scope and intubated without difficulty. Airway exam did reveal candidiasis in the posterior oropharynx with very friable irritated oral mucosa. Poor dentition.  ETT location confirmed by by auscultation, by CXR and ETCO2 monitor.  Initially after ABG resulted, discussed intubation with patient and he refused. I then called his wife who spoke to him via the RN's phone. Patient then consented to intubation given his worsening respiratory status. Tolerated intubation well without complications.

## 2017-10-02 NOTE — Progress Notes (Signed)
Independently examined pt, evaluated data & formulated above care plan with NP/resident   52 year old smoker with severe COPD on home oxygen, HIV, atrial fibrillation on Eliquis , admitted 2/21 required intubation early a.m. for acute hypoxic/hypercarbic respiratory failure.  Respiratory viral panel positive for influenza A FEV1 0.90-29% with significant bronchial dilator response  He is critically ill, intubated, sedated on propofol and fentanyl, on FiO2 80%/PEEP of 8 with auto PEEP noted on ventilator waveforms, faint scattered rhonchi, mild distended abdomen, soft nontender, no edema or JVD.  Chest x-ray reviewed and shows mild left basal infiltrate/atelectasis. Labs reviewed and significant for acute respiratory acidosis, hyponatremia, hyperkalemia is improved and severe hyperglycemia.  Impression/plan  Acute hypercarbic respiratory failure-respiratory rate decreased to 22 with decrease in auto PEEP, will repeat ABG to confirm. PEEP increased to 10, decrease FiO2 as tolerated.  Acute exacerbation of COPD-since bronchospasm is decreased, decrease Solu-Medrol to 40 every 12, continue Pulmicort and Brovana and Xopenex and Atrovent.  AKI-obtain Fina to clarify, hyperkalemia likely due to acidosis is now resolved. Hyponatremia -seems to correct for glucose.  Influenza pneumonia-continue ceftriaxone and Cipro and Tamiflu. HAART to be adjusted per pharmacy  The patient is critically ill with multiple organ systems failure and requires high complexity decision making for assessment and support, frequent evaluation and titration of therapies, application of advanced monitoring technologies and extensive interpretation of multiple databases. Critical Care Time devoted to patient care services described in this note independent of APP/resident  time is 35 minutes.   Comer Locketakesh V. Vassie LollAlva MD

## 2017-10-02 NOTE — Progress Notes (Signed)
ABG collected at 15:43. MD in code.   MD was given results at 16:20. Per MD no changes to be made at this time.

## 2017-10-02 NOTE — Progress Notes (Signed)
Appreciate 6am ABG. Asked RN to increase RR from 24 to 26 and increase PEEP from 5 to 8. She says she will pass this on to RT.

## 2017-10-02 NOTE — Progress Notes (Signed)
Inpatient Diabetes Program Recommendations  AACE/[name]: New Consensus Statement on Inpatient Glycemic Control (2015)  Target Ranges:  Prepandial:   less than 140 mg/dL      Peak postprandial:   less than 180 mg/dL (1-2 hours)      Critically ill patients:  140 - 180 mg/dL   Lab Results  Component Value Date   GLUCAP 387 (H) 10/02/2017   HGBA1C 10.5 09/02/2017    Review of Glycemic Control Results for Chris [name], Bay A JR. (MRN [id number]) as of 10/02/2017 09:24  Ref. Range 10/01/2017 18:17 10/01/2017 20:52 10/02/2017 00:03 10/02/2017 07:24  Glucose-Capillary Latest Ref Range: 65 - 99 mg/dL 914222 (H) 782220 (H) 956199 (H) 387 (H)    Diabetes history: Type 2 DM Outpatient Diabetes medications: Lantus 35 Units HS (per PCP on 09/02/17), Metformin 1,000mg  BID Current orders for Inpatient glycemic control: Novolog moderate correction Q4H  Inpatient Diabetes Program Recommendations:     Noted Solumedrol to 60 mg Q6H, expect blood sugars to continue to increase. Insulin may need adjustment depending on steroids.   Recommend starting Lantus 24 Units QD.  Thanks, Lujean RaveLauren Gerson Fauth, MSN, RNC-OB Diabetes Coordinator [phone] (8a-5p)

## 2017-10-02 NOTE — Progress Notes (Signed)
PULMONARY / CRITICAL CARE MEDICINE   Name: Chris [name]. MRN: [id number] DOB: 1966/06/26    ADMISSION DATE:  10/01/2017 CONSULTATION DATE:  2/21  REFERRING MD:  Juleen China  CHIEF COMPLAINT:  SOB  HISTORY OF PRESENT ILLNESS:   Chris Favors Bertz [name]. is a 52 y.o. male with medical history significant for HIV disease on Biktarvy, atrial fibrillation on Eliquis, COPD on prn nasal cannula oxygen, and diabetes on oral agent.  Patient is also obese.  He reports that for 3 days he has had rhinorrhea, generalized aching, cough with productive white sputum.  He has utilized his home MDIs without relief.  He forgot to use his nebulizers.  He has not had any nausea, vomiting or diarrhea but has not had much appetite.  He felt hot but stated he did not take his temperature.  He also increased his oxygen to 3 L/min from a baseline of 2 L but did not have any improvement in his difficulty breathing.  After arrival to the ER he has required stairstep increases in his oxygen due to persistent hypoxemia and currently is on high flow nasal cannula at 15 L/min.  He is still tachypneic and tachycardic.  He was found to be febrile with an oral temperature of 101.9 F.  Influenza PCR pending.  Chest x-ray without focal infiltrate.  He has been treated presumptively as COPD exacerbation with possible bronchitis and has been given a dose of oral prednisone.  Levaquin was also ordered but given patient's recent hospitalization/discharge on 07/28/17 I have opted to utilize broad-spectrum empiric antibiotic coverage.  Patient's last CD4 count was 430 on 2/15 with a viral load of 22. Because of increased work of breathing and persistent tachycardia/tachypnea patient was placed on bipap and admitted to ICU, intubated shortly after.  PAST MEDICAL HISTORY :  He  has a past medical history of Atrial fibrillation (HCC), COPD (chronic obstructive pulmonary disease) (HCC), Depression, Diabetes mellitus without complication (HCC), GERD  (gastroesophageal reflux disease), and HIV disease (HCC).  PAST SURGICAL HISTORY: He  has a past surgical history that includes Irrigation and debridement abscess (Right, 04/10/2013); Exploratory laparotomy; Incision and drainage abscess (Right, 02/20/2015); knee FRACTURE SURGERY  (Right); arm surgery (Left); Esophagogastroduodenoscopy (egd) with propofol (N/A, 09/03/2016); and Colonoscopy with propofol (N/A, 09/03/2016).  Allergies  Allergen Reactions  . Bactrim [Sulfamethoxazole-Trimethoprim] Hives    No current facility-administered medications on file prior to encounter.    Current Outpatient Medications on File Prior to Encounter  Medication Sig  . ACCU-CHEK SOFTCLIX LANCETS lancets Use as instructed (Patient not taking: Reported on 10/01/2017)  . acetaminophen-codeine (TYLENOL #3) 300-30 MG tablet Take 1 tablet by mouth every 8 (eight) hours as needed for moderate pain.  [name] albuterol (PROVENTIL HFA;VENTOLIN HFA) 108 (90 Base) MCG/ACT inhaler Inhale 2 puffs into the lungs every 4 (four) hours as needed for wheezing or shortness of breath (((PLAN B))).  [name] apixaban (ELIQUIS) 5 MG TABS tablet Take 1 tablet (5 mg total) by mouth 2 (two) times daily.  [name] atorvastatin (LIPITOR) 20 MG tablet Take 1 tablet (20 mg total) by mouth daily.  . bictegravir-emtricitabine-tenofovir AF (BIKTARVY) 50-200-25 MG TABS tablet Take 1 tablet by mouth daily. (Patient taking differently: Take 1 tablet by mouth daily at 3 pm. )  . budesonide-formoterol (SYMBICORT) 80-4.5 MCG/ACT inhaler Take 2 puffs first thing in am and then another 2 puffs about 12 hours later.  . clotrimazole (LOTRIMIN) 1 % cream Apply 1 application topically 2 (two) times daily.  [name]  clotrimazole (MYCELEX) 10 MG troche Take 1 tablet (10 mg total) by mouth 5 (five) times daily.  . clotrimazole-betamethasone (LOTRISONE) cream Apply 1 application topically 2 (two) times daily.  [name]. diltiazem (CARDIZEM CD) 360 MG 24 hr capsule Take 1 capsule (360 mg total)  by mouth daily.  . furosemide (LASIX) 20 MG tablet Take 1 tablet (20 mg total) by mouth daily.  [name]. glucose blood test strip Use as instructed (Patient not taking: Reported on 10/01/2017)  . ipratropium-albuterol (DUONEB) 0.5-2.5 (3) MG/3ML SOLN Take 3 mLs by nebulization every 6 (six) hours as needed.  . metFORMIN (GLUCOPHAGE) 1000 MG tablet Take 1 tablet (1,000 mg total) by mouth 2 (two) times daily with a meal.  . OXYGEN Place 2 L into the nose as needed (shortness of breath).   . pantoprazole (PROTONIX) 40 MG tablet Take 1 tablet (40 mg total) by mouth 2 (two) times daily before a meal. (Patient taking differently: Take 40 mg by mouth 3 times/day as needed-between meals & bedtime. )  . sildenafil (VIAGRA) 50 MG tablet Take 0.5 tablets (25 mg total) by mouth daily as needed for erectile dysfunction.  [name]. SPIRIVA RESPIMAT 2.5 MCG/ACT AERS INHALE 2 PUFFS INTO THE LUNGS DAILY  . terbinafine (LAMISIL) 250 MG tablet Take 1 tablet (250 mg total) by mouth daily.  . Testosterone 12.5 MG/ACT (1%) GEL Place 1 Pump onto the skin daily.  . urea (CARMOL) 20 % cream Apply as needed topically.    FAMILY HISTORY:  His indicated that his mother is deceased. He indicated that his father is deceased. He indicated that the status of his maternal grandmother is unknown. He indicated that his maternal grandfather is alive.   SOCIAL HISTORY: He  reports that he has been smoking cigarettes.  He has a 4.00 pack-year smoking history. he has never used smokeless tobacco. He reports that he does not drink alcohol or use drugs.  REVIEW OF SYSTEMS:   Unable to obtain  SUBJECTIVE:  Intubated, sedated  VITAL SIGNS: BP 100/64   Pulse 79   Temp 98.6 F (37 C)   Resp (!) 28   Ht 5\' 9"  (1.753 m)   Wt 263 lb 3.7 oz (119.4 kg)   SpO2 92%   BMI 38.87 kg/m   HEMODYNAMICS:    VENTILATOR SETTINGS: Vent Mode: PRVC FiO2 (%):  [50 %-100 %] 80 % Set Rate:  [18 bmp-26 bmp] 26 bmp Vt Set:  [570 mL] 570 mL PEEP:  [5  cmH20-8 cmH20] 8 cmH20 Plateau Pressure:  [23 cmH20-26 cmH20] 24 cmH20  INTAKE / OUTPUT: I/O last 3 completed shifts: In: 968.7 [I.V.:118.7; IV Piggyback:850] Out: 325 [Urine:325]  PHYSICAL EXAMINATION: General:  Obese man, laying in bed, sedated, intubated Neuro:  Sedated. PERRL HEENT:  ETT, OGT in place Cardiovascular:  RRR, no MRG Lungs:  CTAB.  Abdomen:  Obese, soft, distended, +BS Musculoskeletal:  SCD's in place. Warm. No edema Skin:  In tact  LABS:  BMET Recent Labs  Lab 09/25/17 1125 10/01/17 1459 10/02/17 0327  NA 135 134* 132*  K 3.7 3.7 6.2*  CL 97* 95* 96*  CO2 29 26 26   BUN 6* 5* 15  CREATININE 0.80 0.79 1.46*  GLUCOSE 314* 257* 342*    Electrolytes Recent Labs  Lab 09/25/17 1125 10/01/17 1459 10/02/17 0327  CALCIUM 8.8 9.2 8.1*  MG  --   --  2.1  PHOS  --   --  6.7*    CBC Recent Labs  Lab 09/25/17 1125  10/01/17 1459 10/02/17 0327  WBC 7.6 8.5 9.6  HGB 13.6 15.0 13.4  HCT 42.8 46.7 43.2  PLT 234 211 212    Coag's Recent Labs  Lab 10/01/17 2023  APTT 35  INR 1.13    Sepsis Markers Recent Labs  Lab 10/01/17 1515 10/01/17 1748  LATICACIDVEN 1.82  --   PROCALCITON  --  0.12    ABG Recent Labs  Lab 10/02/17 0045 10/02/17 0333 10/02/17 0624  PHART 7.264* 7.152* 7.276*  PCO2ART 69.3* 85.5* 58.6*  PO2ART 96.5 169* 81.9*    Liver Enzymes Recent Labs  Lab 09/25/17 1125 10/02/17 0327  AST 12 23  ALT 12 18  ALKPHOS  --  44  BILITOT 0.5 0.8  ALBUMIN  --  3.4*    Cardiac Enzymes Recent Labs  Lab 10/01/17 2023  TROPONINI <0.03    Glucose Recent Labs  Lab 10/01/17 1817 10/01/17 2052 10/02/17 0003 10/02/17 0724  GLUCAP 222* 220* 199* 387*    Imaging Dg Chest 2 View  Result Date: 10/01/2017 CLINICAL DATA:  Cough and shortness of breath.  Chest pain EXAM: CHEST  2 VIEW COMPARISON:  Chest CT July 23, 2017; chest radiograph July 23, 2017 FINDINGS: There is bibasilar atelectatic change. There is no  frank edema or consolidation. Heart is mildly enlarged with pulmonary vascularity within normal limits. No adenopathy. No bone lesions. No pneumothorax. IMPRESSION: Mild cardiomegaly. Bibasilar atelectasis. No frank edema or consolidation. Electronically Signed   By: Bretta Bang III M.D.   On: 10/01/2017 16:13   Dg Chest Port 1 View  Result Date: 10/02/2017 CLINICAL DATA:  52 year old male status post intubation. EXAM: PORTABLE CHEST 1 VIEW COMPARISON:  Chest radiograph dated 10/01/2017 FINDINGS: Endotracheal tube with tip approximately 4.5 cm above the carina. The enteric tube extends into the left upper abdomen. There is a focal area of kink or looping of the tube. The side port and tip of the tube all located in the proximal to mid stomach. Minimal bibasilar atelectatic changes. No focal consolidation, pleural effusion, or pneumothorax. Mild cardiomegaly. No acute osseous pathology. IMPRESSION: 1. Endotracheal tube above the carina. 2. Enteric tube extends into the left upper abdomen with tip and side-port likely in the proximal to mid stomach. 3. Cardiomegaly with bibasilar atelectatic changes. Electronically Signed   By: Elgie Collard M.D.   On: 10/02/2017 03:11    STUDIES:  CXR 2/21 > no frank edema or consolidation CXR 2/22> Endotracheal tube above the carina. Enteric tube extends into the left upper abdomen with tip and side-port likely in the proximal to mid stomach. Cardiomegaly with bibasilar atelectatic changes.  CULTURES: RVP > pending Tracheal aspirate > pending Blood cx > pending Sputum cx > pending  ANTIBIOTICS: Vancomycin 2/21 >> Cefepime 2/21 >> Tamiflu 2/21 >> Fluconazole 2/21 >>  SIGNIFICANT EVENTS: Admitted 2/21, intubated  LINES/TUBES: ETT 2/22 >>  DISCUSSION: 51yom with COPD on PRN oxygen presented with ~2 day h/o SOB. Now with marked respiratory distress, remains SOB on BiPAP. Intubated. Concern for influenza, occult pneumonia; superimposed on COPD  exacerbation. Tamiflu, broad-spectrum abx, steroids, nebs.  ASSESSMENT / PLAN:  PULMONARY A: Acute on chronic respiratory failure w/ hypoxia COPD P:   Wean vent support as able Continue Steroids- solumedrol 60 mg q12 xopenex nebs, atrovent nebs Brovana, pulmicort nebs on Continue antibiotics for HCAP  CARDIOVASCULAR A:  History atrial fibrillation HTN P:  Continue CCB, eliquis Monitor on telemetry  RENAL A:   AKI Hyperkalemia - after receiving succ for intubation  P:   Continue IVF Monitor BMP  GASTROINTESTINAL A:   Obesity SUP P:   IV PPI Tube feeds Bowel regimen  HEMATOLOGIC A:   Nothing acute P:  Monitor CBC  INFECTIOUS A:   HIV last CD4 >400, viral load 22 P:   Continue biktarvy  ENDOCRINE A:   Type 2 DM  - last A1C 10.5 P:   Moderate SSI- may need resistant while on steroids q4 CBG  NEUROLOGIC A:   Sedated for intubation P:   RASS goal: 0, -1 Continue sedation w/ propofol, versed, fentanyl  FAMILY  - Updates: no family at bedside 2/22 - Inter-disciplinary family meet or Palliative Care meeting due by: 3/1  Dolores Patty, DO PGY-2, [facility] 10/02/2017 8:24 AM

## 2017-10-02 NOTE — Progress Notes (Signed)
Patient has developed AKI on AM labs (creatinine 1.46 up from 0.79); will give 1L NS bolus and start NS @ 125cc/hr. Stop the PRN Toradol that had been ordered.   AM labs also show hyperkalemia (K:6.2), but he had just been intubated 1 hour before this and did receive succ during intubation. Will repeat BMP @ 10am.

## 2017-10-02 NOTE — Progress Notes (Signed)
High potassium reported to E-Link. No new orders at this time.

## 2017-10-02 NOTE — Progress Notes (Signed)
Initial Nutrition Assessment  DOCUMENTATION CODES:   Obesity unspecified  INTERVENTION:    Vital High Protein at 20 ml/h (480 ml per day)--this is goal rate  Pro-stat 60 ml TID  Provides 1080 kcal (1648 kcal total with Propofol), 132 gm protein, 401 ml free water daily  NUTRITION DIAGNOSIS:   Inadequate oral intake related to inability to eat as evidenced by NPO status.  GOAL:   Provide needs based on ASPEN/SCCM guidelines  MONITOR:   Vent status, TF tolerance, Labs, I & O's  REASON FOR ASSESSMENT:   Ventilator, Consult Enteral/tube feeding initiation and management  ASSESSMENT:   52 yo male with PMH of COPD, smoker, HIV, DM, depression, GERD, A fib who was admitted on 2/21 with acute on chronic hypoxic respiratory failure, COPD exacerbation, influenza A. Required intubation shortly after admission.  Discussed patient in ICU rounds and with RN today. Received MD Consult for TF initiation and management. Plans for trickle feeding at 20 ml/h today.  Patient is currently intubated on ventilator support Temp (24hrs), Avg:99.2 F (37.3 C), Min:97.7 F (36.5 C), Max:101.9 F (38.8 C)  Propofol: 21.5 ml/hr providing 568 kcal from lipids. Labs reviewed. Sodium 130 (L) CBG's: 387-351 Medications reviewed and include Propofol.  NUTRITION - FOCUSED PHYSICAL EXAM:    Most Recent Value  Orbital Region  No depletion  Upper Arm Region  No depletion  Thoracic and Lumbar Region  Unable to assess  Buccal Region  Unable to assess  Temple Region  No depletion  Clavicle Bone Region  No depletion  Clavicle and Acromion Bone Region  No depletion  Scapular Bone Region  Unable to assess  Dorsal Hand  Unable to assess  Patellar Region  No depletion  Anterior Thigh Region  No depletion  Posterior Calf Region  No depletion  Edema (RD Assessment)  Moderate  Hair  Reviewed  Eyes  Unable to assess  Mouth  Unable to assess  Skin  Reviewed  Nails  Unable to assess       Diet  Order:  Diet NPO time specified  EDUCATION NEEDS:   No education needs have been identified at this time  Skin:  Skin Assessment: Reviewed RN Assessment  Last BM:  2/20  Height:   Ht Readings from Last 1 Encounters:  10/01/17 5\' 9"  (1.753 m)    Weight:   Wt Readings from Last 1 Encounters:  10/02/17 263 lb 3.7 oz (119.4 kg)    Ideal Body Weight:  72.7 kg  BMI:  Body mass index is 38.87 kg/m.  Estimated Nutritional Needs:   Kcal:  [id number]  Protein:  145 gm  Fluid:  2 L    Joaquin CourtsKimberly Medard Decuir, RD, LDN, CNSC Pager [phone] After Hours Pager [phone]

## 2017-10-02 NOTE — Progress Notes (Signed)
Patients ABG is unchanged. Reported to E-Link, [employer] Group[employer] Panchal.

## 2017-10-02 NOTE — Progress Notes (Signed)
OG tube placement is correct and ready for use per E-Link.

## 2017-10-02 NOTE — Progress Notes (Signed)
Spoke with Dr.Amar Panchal about ABG. FIO2 60% RR24. Message relayed to DennisSherri, RT

## 2017-10-03 ENCOUNTER — Inpatient Hospital Stay (HOSPITAL_COMMUNITY): Payer: Medicare HMO

## 2017-10-03 LAB — POCT I-STAT 3, ART BLOOD GAS (G3+)
Acid-base deficit: 4 mmol/L — ABNORMAL HIGH (ref 0.0–2.0)
Acid-base deficit: 5 mmol/L — ABNORMAL HIGH (ref 0.0–2.0)
Bicarbonate: 23.1 mmol/L (ref 20.0–28.0)
Bicarbonate: 25.7 mmol/L (ref 20.0–28.0)
O2 Saturation: 90 %
O2 Saturation: 91 %
PCO2 ART: 50.4 mmHg — AB (ref 32.0–48.0)
PCO2 ART: 76.2 mmHg — AB (ref 32.0–48.0)
PH ART: 7.141 — AB (ref 7.350–7.450)
PO2 ART: 73 mmHg — AB (ref 83.0–108.0)
PO2 ART: 82 mmHg — AB (ref 83.0–108.0)
Patient temperature: 99.7
Patient temperature: 99.7
TCO2: 25 mmol/L (ref 22–32)
TCO2: 28 mmol/L (ref 22–32)
pH, Arterial: 7.272 — ABNORMAL LOW (ref 7.350–7.450)

## 2017-10-03 LAB — GLUCOSE, CAPILLARY
GLUCOSE-CAPILLARY: 254 mg/dL — AB (ref 65–99)
GLUCOSE-CAPILLARY: 261 mg/dL — AB (ref 65–99)
Glucose-Capillary: 257 mg/dL — ABNORMAL HIGH (ref 65–99)
Glucose-Capillary: 295 mg/dL — ABNORMAL HIGH (ref 65–99)
Glucose-Capillary: 266 mg/dL — ABNORMAL HIGH (ref 65–99)

## 2017-10-03 LAB — CBC WITH DIFFERENTIAL/PLATELET
BASOS ABS: 0 10*3/uL (ref 0.0–0.1)
BASOS PCT: 0 %
EOS ABS: 0 10*3/uL (ref 0.0–0.7)
Eosinophils Relative: 0 %
HCT: 40.7 % (ref 39.0–52.0)
HEMOGLOBIN: 12.3 g/dL — AB (ref 13.0–17.0)
Lymphocytes Relative: 5 %
Lymphs Abs: 0.5 10*3/uL — ABNORMAL LOW (ref 0.7–4.0)
MCH: 28.1 pg (ref 26.0–34.0)
MCHC: 30.2 g/dL (ref 30.0–36.0)
MCV: 92.9 fL (ref 78.0–100.0)
Monocytes Absolute: 0.7 10*3/uL (ref 0.1–1.0)
Monocytes Relative: 7 %
NEUTROS PCT: 88 %
Neutro Abs: 9.5 10*3/uL — ABNORMAL HIGH (ref 1.7–7.7)
Platelets: 164 10*3/uL (ref 150–400)
RBC: 4.38 MIL/uL (ref 4.22–5.81)
RDW: 12.6 % (ref 11.5–15.5)
WBC: 10.7 10*3/uL — AB (ref 4.0–10.5)

## 2017-10-03 LAB — BASIC METABOLIC PANEL
ANION GAP: 12 (ref 5–15)
BUN: 37 mg/dL — ABNORMAL HIGH (ref 6–20)
CO2: 21 mmol/L — ABNORMAL LOW (ref 22–32)
Calcium: 8.2 mg/dL — ABNORMAL LOW (ref 8.9–10.3)
Chloride: 102 mmol/L (ref 101–111)
Creatinine, Ser: 2.09 mg/dL — ABNORMAL HIGH (ref 0.61–1.24)
GFR calc Af Amer: 41 mL/min — ABNORMAL LOW (ref >60)
GFR, EST NON AFRICAN AMERICAN: 35 mL/min — AB (ref >60)
Glucose, Bld: 265 mg/dL — ABNORMAL HIGH (ref 65–99)
POTASSIUM: 4.8 mmol/L (ref 3.5–5.1)
SODIUM: 135 mmol/L (ref 135–145)

## 2017-10-03 LAB — MAGNESIUM: MAGNESIUM: 2.4 mg/dL (ref 1.7–2.4)

## 2017-10-03 LAB — PHOSPHORUS: PHOSPHORUS: 4.6 mg/dL (ref 2.5–4.6)

## 2017-10-03 MED ORDER — METHYLPREDNISOLONE SODIUM SUCC 125 MG IJ SOLR
60.0000 mg | Freq: Three times a day (TID) | INTRAMUSCULAR | Status: DC
  Administered 2017-10-03 – 2017-10-04 (×3): 60 mg via INTRAVENOUS
  Filled 2017-10-03 (×3): qty 0.96

## 2017-10-03 MED ORDER — INSULIN GLARGINE 100 UNIT/ML ~~LOC~~ SOLN
25.0000 [IU] | Freq: Every day | SUBCUTANEOUS | Status: DC
  Administered 2017-10-03: 25 [IU] via SUBCUTANEOUS
  Filled 2017-10-03 (×2): qty 0.25

## 2017-10-03 MED ORDER — OSELTAMIVIR PHOSPHATE 6 MG/ML PO SUSR
30.0000 mg | Freq: Two times a day (BID) | ORAL | Status: DC
  Administered 2017-10-03 – 2017-10-04 (×3): 30 mg
  Filled 2017-10-03 (×4): qty 12.5

## 2017-10-03 MED ORDER — SENNA 8.6 MG PO TABS
1.0000 | ORAL_TABLET | Freq: Every day | ORAL | Status: DC
  Administered 2017-10-03 – 2017-10-12 (×8): 8.6 mg via ORAL
  Filled 2017-10-03 (×11): qty 1

## 2017-10-03 NOTE — Progress Notes (Signed)
PHARMACY NOTE:  ANTIMICROBIAL RENAL DOSAGE ADJUSTMENT  Current antimicrobial regimen includes a mismatch between antimicrobial dosage and estimated renal function.  As per policy approved by the Pharmacy & Therapeutics and Medical Executive Committees, the antimicrobial dosage will be adjusted accordingly.  Current antimicrobial dosage:  Tamiflu 75 mg BID   Indication: Flu A   Renal Function: ~ 45 mL/min   Estimated Creatinine Clearance: 53.8 mL/min (A) (by C-G formula based on SCr of 2.09 mg/dL (H)). []      On intermittent HD, scheduled: []      On CRRT    Antimicrobial dosage has been changed to:  Tamiflu 30 mg BID   Additional comments:   Thank you for allowing pharmacy to be a part of this patient's care.  Vinnie LevelBenjamin Mitsuo Budnick, PharmD., BCPS Clinical Pharmacist Pager [phone]

## 2017-10-03 NOTE — Progress Notes (Signed)
Independently examined pt, evaluated data & formulated above care plan with NP/resident   52 year old smoker with severe COPD on home oxygen, HIV, atrial fibrillation on Eliquis , admitted 2/21 required intubation early a.m. for acute hypoxic/hypercarbic respiratory failure.  Respiratory viral panel positive for influenza A FEV1 0.90-29% with significant bronchial dilator response   He remains critically ill, sedated on propofol and fentanyl, remains on high FiO2 80% and PEEP of 10, mild auto PEEP and respiratory rate of 22 which improves when respiratory rate is lowered to 16, decreased rhonchi, mild distended abdomen, no edema or JVD.  Chest x-ray reviewed which only shows mild left basilar atelectasis. Labs again showed persistent acute respiratory acidosis, creatinine rising and sugar is high.  Impression/plan  Acute hypercarbic respiratory failure-keep respiratory rate around 20 to avoid auto PEEP PEEP remains at 10, decrease FiO2 as tolerated.  Acute exacerbation of COPD-since bronchospasm is decreased,ct  Solu-Medrol to 40 every 12, continue Pulmicort and Brovana and Xopenex and Atrovent.  AKI-low Fina confirms prerenal, continue IV fluids at 75 an hour Hyponatremia -improved  Influenza pneumonia-continue ceftriaxone and azithro and Tamiflu. HAART  adjusted per pharmacy  Wife updated 2/22  The patient is critically ill with multiple organ systems failure and requires high complexity decision making for assessment and support, frequent evaluation and titration of therapies, application of advanced monitoring technologies and extensive interpretation of multiple databases. Critical Care Time devoted to patient care services described in this note independent of APP/resident  time is 35 minutes.   Chris Locketakesh V. Vassie LollAlva MD

## 2017-10-03 NOTE — Progress Notes (Signed)
PULMONARY / CRITICAL CARE MEDICINE   Name: Chris RiversJerome A Wolven Jr. MRN: [id number] DOB: 11/01/1965    ADMISSION DATE:  10/01/2017 CONSULTATION DATE:  2/21  REFERRING MD:  Chris [name]  CHIEF COMPLAINT:  SOB  HISTORY OF PRESENT ILLNESS:   Chris FavorsJerome A Feldhaus [name] is a 52 y.o. male with medical history significant for HIV disease on Biktarvy, atrial fibrillation on Eliquis, COPD on prn nasal cannula oxygen, and diabetes on oral agent.  Patient is also obese.  He reports that for 3 days he has had rhinorrhea, generalized aching, cough with productive white sputum.  He has utilized his home MDIs without relief.  He forgot to use his nebulizers.  He has not had any nausea, vomiting or diarrhea but has not had much appetite.  He felt hot but stated he did not take his temperature.  He also increased his oxygen to 3 L/min from a baseline of 2 L but did not have any improvement in his difficulty breathing.  After arrival to the ER he has required stairstep increases in his oxygen due to persistent hypoxemia and currently is on high flow nasal cannula at 15 L/min.  He is still tachypneic and tachycardic.  He was found to be febrile with an oral temperature of 101.9 F.  Influenza PCR pending.  Chest x-ray without focal infiltrate.  He has been treated presumptively as COPD exacerbation with possible bronchitis and has been given a dose of oral prednisone.  Levaquin was also ordered but given patient's recent hospitalization/discharge on 07/28/17 I have opted to utilize broad-spectrum empiric antibiotic coverage.  Patient's last CD4 count was 430 on 2/15 with a viral load of 22. Because of increased work of breathing and persistent tachycardia/tachypnea patient was placed on bipap and admitted to ICU, intubated shortly after.  PAST MEDICAL HISTORY :  He  has a past medical history of Atrial fibrillation (HCC), COPD (chronic obstructive pulmonary disease) (HCC), Depression, Diabetes mellitus without complication (HCC), GERD  (gastroesophageal reflux disease), and HIV disease (HCC).  PAST SURGICAL HISTORY: He  has a past surgical history that includes Irrigation and debridement abscess (Right, 04/10/2013); Exploratory laparotomy; Incision and drainage abscess (Right, 02/20/2015); knee FRACTURE SURGERY  (Right); arm surgery (Left); Esophagogastroduodenoscopy (egd) with propofol (N/A, 09/03/2016); and Colonoscopy with propofol (N/A, 09/03/2016).  Allergies  Allergen Reactions  . Bactrim [Sulfamethoxazole-Trimethoprim] Hives    No current facility-administered medications on file prior to encounter.    Current Outpatient Medications on File Prior to Encounter  Medication Sig  . acetaminophen-codeine (TYLENOL #3) 300-30 MG tablet Take 1 tablet by mouth every 8 (eight) hours as needed for moderate pain.  [name]. albuterol (PROVENTIL HFA;VENTOLIN HFA) 108 (90 Base) MCG/ACT inhaler Inhale 2 puffs into the lungs every 4 (four) hours as needed for wheezing or shortness of breath (((PLAN B))).  [name]. apixaban (ELIQUIS) 5 MG TABS tablet Take 1 tablet (5 mg total) by mouth 2 (two) times daily.  . bictegravir-emtricitabine-tenofovir AF (BIKTARVY) 50-200-25 MG TABS tablet Take 1 tablet by mouth daily. (Patient taking differently: Take 1 tablet by mouth daily at 3 pm. )  . clotrimazole-betamethasone (LOTRISONE) cream Apply 1 application topically 2 (two) times daily.  [name]. diltiazem (CARDIZEM CD) 360 MG 24 hr capsule Take 1 capsule (360 mg total) by mouth daily.  . furosemide (LASIX) 20 MG tablet Take 1 tablet (20 mg total) by mouth daily.  . metFORMIN (GLUCOPHAGE) 1000 MG tablet Take 1 tablet (1,000 mg total) by mouth 2 (two) times daily with a meal.  .  SPIRIVA RESPIMAT 2.5 MCG/ACT AERS INHALE 2 PUFFS INTO THE LUNGS DAILY  . terbinafine (LAMISIL) 250 MG tablet Take 1 tablet (250 mg total) by mouth daily.  . Testosterone 12.5 MG/ACT (1%) GEL Place 1 Pump onto the skin daily.  [name] ACCU-CHEK SOFTCLIX LANCETS lancets Use as instructed (Patient not  taking: Reported on 10/01/2017)  . atorvastatin (LIPITOR) 20 MG tablet Take 1 tablet (20 mg total) by mouth daily.  . budesonide-formoterol (SYMBICORT) 80-4.5 MCG/ACT inhaler Take 2 puffs first thing in am and then another 2 puffs about 12 hours later.  . clotrimazole (LOTRIMIN) 1 % cream Apply 1 application topically 2 (two) times daily.  . clotrimazole (MYCELEX) 10 MG troche Take 1 tablet (10 mg total) by mouth 5 (five) times daily.  [name] glucose blood test strip Use as instructed (Patient not taking: Reported on 10/01/2017)  . ipratropium-albuterol (DUONEB) 0.5-2.5 (3) MG/3ML SOLN Take 3 mLs by nebulization every 6 (six) hours as needed.  . OXYGEN Place 2 L into the nose as needed (shortness of breath).   . pantoprazole (PROTONIX) 40 MG tablet Take 1 tablet (40 mg total) by mouth 2 (two) times daily before a meal. (Patient taking differently: Take 40 mg by mouth 3 times/day as needed-between meals & bedtime. )  . sildenafil (VIAGRA) 50 MG tablet Take 0.5 tablets (25 mg total) by mouth daily as needed for erectile dysfunction.  . urea (CARMOL) 20 % cream Apply as needed topically.    FAMILY HISTORY:  His indicated that his mother is deceased. He indicated that his father is deceased. He indicated that the status of his maternal grandmother is unknown. He indicated that his maternal grandfather is alive.   SOCIAL HISTORY: He  reports that he has been smoking cigarettes.  He has a 4.00 pack-year smoking history. he has never used smokeless tobacco. He reports that he does not drink alcohol or use drugs.  REVIEW OF SYSTEMS:   Unable to obtain  SUBJECTIVE:  Intubated, sedated.   VITAL SIGNS: BP 123/77   Pulse 93   Temp 98.2 F (36.8 C)   Resp (!) 22   Ht 5\' 9"  (1.753 m)   Wt 267 lb 3.2 oz (121.2 kg)   SpO2 92%   BMI 39.46 kg/m   HEMODYNAMICS:    VENTILATOR SETTINGS: Vent Mode: PRVC FiO2 (%):  [80 %] 80 % Set Rate:  [22 bmp] 22 bmp Vt Set:  [570 mL] 570 mL PEEP:  [10 cmH20] 10  cmH20 Plateau Pressure:  [23 cmH20-29 cmH20] 23 cmH20  INTAKE / OUTPUT: I/O last 3 completed shifts: In: 6045.1 [I.V.:3695.1; NG/GT:650; IV Piggyback:1700] Out: 1380 [Urine:1380]  PHYSICAL EXAMINATION: General:  Obese man, laying in bed, sedated, intubated Neuro:  Sedated. PERRL HEENT:  ETT, OGT in place Cardiovascular:  RRR, no MRG Lungs:  CTAB.  Abdomen:  Obese, soft, distended, +BS Musculoskeletal:  SCD's in place. Warm. No edema Skin:  In tact  LABS:  BMET Recent Labs  Lab 10/02/17 0327 10/02/17 0906 10/03/17 0305  NA 132* 130* 135  K 6.2* 5.1 4.8  CL 96* 97* 102  CO2 26 22 21*  BUN 15 24* 37*  CREATININE 1.46* 1.81* 2.09*  GLUCOSE 342* 428* 265*    Electrolytes Recent Labs  Lab 10/02/17 0327 10/02/17 0906 10/03/17 0305  CALCIUM 8.1* 7.7* 8.2*  MG 2.1  --  2.4  PHOS 6.7*  --  4.6    CBC Recent Labs  Lab 10/01/17 1459 10/02/17 0327 10/03/17 0305  WBC  8.5 9.6 10.7*  HGB 15.0 13.4 12.3*  HCT 46.7 43.2 40.7  PLT 211 212 164    Coag's Recent Labs  Lab 10/01/17 2023  APTT 35  INR 1.13    Sepsis Markers Recent Labs  Lab 10/01/17 1515 10/01/17 1748  LATICACIDVEN 1.82  --   PROCALCITON  --  0.12    ABG Recent Labs  Lab 10/02/17 0918 10/02/17 1543 10/03/17 0421  PHART 7.271* 7.235* 7.244*  PCO2ART 56.7* 64.3* 59.5*  PO2ART 98.0 86.0 88.0    Liver Enzymes Recent Labs  Lab 10/02/17 0327  AST 23  ALT 18  ALKPHOS 44  BILITOT 0.8  ALBUMIN 3.4*    Cardiac Enzymes Recent Labs  Lab 10/01/17 2023  TROPONINI <0.03    Glucose Recent Labs  Lab 10/02/17 1151 10/02/17 1552 10/02/17 2006 10/02/17 2355 10/03/17 0344 10/03/17 0757  GLUCAP 351* 279* 262* 230* 257* 254*    Imaging Dg Chest Port 1 View  Result Date: 10/03/2017 CLINICAL DATA:  Dyspnea. EXAM: PORTABLE CHEST 1 VIEW COMPARISON:  Chest x-ray from yesterday. FINDINGS: Unchanged endotracheal and enteric tubes. Stable cardiomegaly. Slightly worsened aeration at the  right lung base, likely atelectasis. No focal consolidation, pleural effusion, or pneumothorax. No acute osseous abnormality. IMPRESSION: Increased atelectasis at the right lung base. Electronically Signed   By: Obie DredgeWilliam T Derry M.D.   On: 10/03/2017 08:18    STUDIES:  CXR 2/21 >> no frank edema or consolidation CXR 2/22>> Endotracheal tube above the carina. Enteric tube extends into the left upper abdomen with tip and side-port likely in the proximal to mid stomach. Cardiomegaly with bibasilar atelectatic changes. CXR 2/23 >>Increased atelectasis at the right lung base   CULTURES: RVP >Flu positive Tracheal aspirate > Pending Blood cx > NGTD  ANTIBIOTICS: Vancomycin 2/21 >>2/22 Cefepime 2/21 >> Tamiflu 2/21 >> Fluconazole 2/21 >>  SIGNIFICANT EVENTS: Admitted 2/21, intubated  LINES/TUBES: ETT 2/22 >>  DISCUSSION: 51yom with COPD on PRN oxygen presented with ~2 day h/o SOB. Now with marked respiratory distress, remains SOB on BiPAP. Intubated. Influenza positive, occult pneumonia; superimposed on COPD exacerbation. Tamiflu, broad-spectrum abx, steroids, nebs.  ASSESSMENT / PLAN:  PULMONARY A: Acute on chronic respiratory failure w/ hypoxia. ABG show mild acidosis with O2 sat 88%.  COPD P:   Wean vent support as able Continue Steroids- solumedrol 60 mg q8 Continue Xopenex nebs, atrovent nebs Continue Brovana, pulmicort nebs Continue antibiotics for HCAP  CARDIOVASCULAR A:  History atrial fibrillation HTN P:  Continue Metoprolol Continue Eliquis Monitor on telemetry  RENAL A:   AKI- FeNa<1%. Prerenal in the setting of poor intake due to flu and COPD exacerbation. Hyperkalemia-resolving P:   Continue IVF Monitor BMP  GASTROINTESTINAL A:   Obesity SUP P:   IV PPI Tube feeds Bowel regimen  HEMATOLOGIC A:   On Eliquis. Hemoglobin and platelets are stable. P:  Monitor CBC  INFECTIOUS A:   HIV last CD4 >400, viral load 22. Flu positive. Afebrile.  Worsening RLL atelectasis. Blood cultures NGTD. P:   Continue Cefepime Continue Tivicay, Descovay Continue Tamiflu Follow up on blood and aspirate cultures   ENDOCRINE A:   Type 2 DM  - Last A1C 10.5. On steroid q8 P:   Lantus 25 u daily Moderate SSI- may need resistant while on steroids CBG q4  NEUROLOGIC  A:   Sedated for intubation P:   RASS goal: 0, -1 Continue sedation w/ propofol, versed, fentanyl  FAMILY  - Updates: no family at bedside 2/22 - Inter-disciplinary  family meet or Palliative Care meeting due by: 3/1  . 10/03/2017 10:14 AM

## 2017-10-04 ENCOUNTER — Inpatient Hospital Stay (HOSPITAL_COMMUNITY): Payer: Medicare HMO

## 2017-10-04 DIAGNOSIS — J9601 Acute respiratory failure with hypoxia: Secondary | ICD-10-CM

## 2017-10-04 DIAGNOSIS — J441 Chronic obstructive pulmonary disease with (acute) exacerbation: Secondary | ICD-10-CM

## 2017-10-04 DIAGNOSIS — J1100 Influenza due to unidentified influenza virus with unspecified type of pneumonia: Secondary | ICD-10-CM

## 2017-10-04 LAB — CBC WITH DIFFERENTIAL/PLATELET
Band Neutrophils: 0 %
Basophils Absolute: 0 10*3/uL (ref 0.0–0.1)
Basophils Relative: 0 %
Blasts: 0 %
EOS ABS: 0 10*3/uL (ref 0.0–0.7)
Eosinophils Relative: 0 %
HCT: 39.3 % (ref 39.0–52.0)
Hemoglobin: 11.8 g/dL — ABNORMAL LOW (ref 13.0–17.0)
LYMPHS ABS: 1.2 10*3/uL (ref 0.7–4.0)
Lymphocytes Relative: 10 %
MCH: 28.2 pg (ref 26.0–34.0)
MCHC: 30 g/dL (ref 30.0–36.0)
MCV: 93.8 fL (ref 78.0–100.0)
METAMYELOCYTES PCT: 0 %
MYELOCYTES: 0 %
Monocytes Absolute: 0.4 10*3/uL (ref 0.1–1.0)
Monocytes Relative: 3 %
NEUTROS PCT: 87 %
Neutro Abs: 10.2 10*3/uL — ABNORMAL HIGH (ref 1.7–7.7)
PLATELETS: 172 10*3/uL (ref 150–400)
PROMYELOCYTES ABS: 0 %
RBC: 4.19 MIL/uL — AB (ref 4.22–5.81)
RDW: 12.7 % (ref 11.5–15.5)
WBC: 11.7 10*3/uL — ABNORMAL HIGH (ref 4.0–10.5)
nRBC: 0 /100 WBC

## 2017-10-04 LAB — POCT I-STAT 3, ART BLOOD GAS (G3+)
Acid-base deficit: 2 mmol/L (ref 0.0–2.0)
Bicarbonate: 24.5 mmol/L (ref 20.0–28.0)
O2 Saturation: 89 %
PCO2 ART: 49.4 mmHg — AB (ref 32.0–48.0)
PO2 ART: 64 mmHg — AB (ref 83.0–108.0)
Patient temperature: 99.3
TCO2: 26 mmol/L (ref 22–32)
pH, Arterial: 7.305 — ABNORMAL LOW (ref 7.350–7.450)

## 2017-10-04 LAB — GLUCOSE, CAPILLARY
GLUCOSE-CAPILLARY: 242 mg/dL — AB (ref 65–99)
GLUCOSE-CAPILLARY: 321 mg/dL — AB (ref 65–99)
GLUCOSE-CAPILLARY: 329 mg/dL — AB (ref 65–99)
Glucose-Capillary: 242 mg/dL — ABNORMAL HIGH (ref 65–99)
Glucose-Capillary: 354 mg/dL — ABNORMAL HIGH (ref 65–99)
Glucose-Capillary: 283 mg/dL — ABNORMAL HIGH (ref 65–99)
Glucose-Capillary: 328 mg/dL — ABNORMAL HIGH (ref 65–99)
Glucose-Capillary: 297 mg/dL — ABNORMAL HIGH (ref 65–99)

## 2017-10-04 LAB — BASIC METABOLIC PANEL
Anion gap: 11 (ref 5–15)
BUN: 55 mg/dL — ABNORMAL HIGH (ref 6–20)
CALCIUM: 8.6 mg/dL — AB (ref 8.9–10.3)
CO2: 19 mmol/L — AB (ref 22–32)
CREATININE: 2.08 mg/dL — AB (ref 0.61–1.24)
Chloride: 105 mmol/L (ref 101–111)
GFR calc Af Amer: 41 mL/min — ABNORMAL LOW (ref >60)
GFR calc non Af Amer: 35 mL/min — ABNORMAL LOW (ref >60)
GLUCOSE: 318 mg/dL — AB (ref 65–99)
Potassium: 4.4 mmol/L (ref 3.5–5.1)
Sodium: 135 mmol/L (ref 135–145)

## 2017-10-04 LAB — MAGNESIUM: Magnesium: 2.9 mg/dL — ABNORMAL HIGH (ref 1.7–2.4)

## 2017-10-04 LAB — BRAIN NATRIURETIC PEPTIDE: B Natriuretic Peptide: 46.8 pg/mL (ref 0.0–100.0)

## 2017-10-04 LAB — PHOSPHORUS: Phosphorus: 3.4 mg/dL (ref 2.5–4.6)

## 2017-10-04 MED ORDER — INSULIN GLARGINE 100 UNIT/ML ~~LOC~~ SOLN
30.0000 [IU] | Freq: Every day | SUBCUTANEOUS | Status: DC
  Administered 2017-10-04: 30 [IU] via SUBCUTANEOUS
  Filled 2017-10-04 (×2): qty 0.3

## 2017-10-04 MED ORDER — METHYLPREDNISOLONE SODIUM SUCC 125 MG IJ SOLR
60.0000 mg | Freq: Two times a day (BID) | INTRAMUSCULAR | Status: DC
  Administered 2017-10-04 – 2017-10-07 (×6): 60 mg via INTRAVENOUS
  Filled 2017-10-04 (×6): qty 0.96

## 2017-10-04 MED ORDER — FENTANYL CITRATE (PF) 2500 MCG/50ML IJ SOLN
25.0000 ug/h | Status: DC
  Administered 2017-10-04: 50 ug/h via INTRAVENOUS
  Administered 2017-10-05: 250 ug/h via INTRAVENOUS
  Administered 2017-10-06: 400 ug/h via INTRAVENOUS
  Administered 2017-10-06: 300 ug/h via INTRAVENOUS
  Administered 2017-10-07 (×2): 400 ug/h via INTRAVENOUS
  Administered 2017-10-08: 100 ug/h via INTRAVENOUS
  Administered 2017-10-10: 150 ug/h via INTRAVENOUS
  Administered 2017-10-11: 100 ug/h via INTRAVENOUS
  Administered 2017-10-12: 200 ug/h via INTRAVENOUS
  Administered 2017-10-13: 100 ug/h via INTRAVENOUS
  Administered 2017-10-14 (×2): 400 ug/h via INTRAVENOUS
  Administered 2017-10-15: 125 ug/h via INTRAVENOUS
  Administered 2017-10-15: 50 ug/h via INTRAVENOUS
  Administered 2017-10-16 – 2017-10-17 (×2): 300 ug/h via INTRAVENOUS
  Administered 2017-10-17 (×2): 150 ug/h via INTRAVENOUS
  Administered 2017-10-17: 200 ug/h via INTRAVENOUS
  Administered 2017-10-17: 300 ug/h via INTRAVENOUS
  Administered 2017-10-18: 150 ug/h via INTRAVENOUS
  Administered 2017-10-21: 100 ug/h via INTRAVENOUS
  Administered 2017-10-22: 150 ug/h via INTRAVENOUS
  Filled 2017-10-04 (×20): qty 100

## 2017-10-04 NOTE — Progress Notes (Signed)
Patient has been getting more hypoxic requiring higher vent settings. He had a history of diastolic CHF in the past but is still on NS gtt at this time due to his AKI. Will check BNP and order TTE. Consider stopping NS gtt and diuresing pending results of renal function this AM.

## 2017-10-04 NOTE — Progress Notes (Signed)
Independently examined pt, evaluated data & formulated above care plan with NP/resident   52 year old smoker with severe COPD on home oxygen, HIV, atrial fibrillation on Eliquis,admitted 2/21 required intubation early a.m. for acute hypoxic/hypercarbic respiratory failure.Respiratory viral panel positive for influenza A 06/2016 FEV1 0.90-29% with significant bronchial dilator response  He continues to require 80%/PEEP of 10, remains critically ill and deeply sedated on propofol and fentanyl drips.  Respiratory rate was increased to 30 but he continues to have auto PEEP at this rate, decreased breath sounds bilaterally with faint scattered rhonchi, no edema or JVD mildly distended abdomen.  Chest x-ray reviewed which only shows bibasilar atelectasis no clear infiltrates.  Labs show acute respiratory acidosis, low bicarbonate and high sugars, creatinine has plateaued at 2.1. BNP is low  Impression/plan  Acute hypercarbic respiratory failure-keep respiratory rate around 20 to avoid auto PEEP PEEP remains at 10, decrease FiO2 as tolerated.  Acute exacerbation of COPD- bronchospasm is decreased,ct  Solu-Medrol to 40 every 12, continue Pulmicort and Brovana and Xopenex and Atrovent.  AKI-low Fina confirms prerenal, continue IV fluids at 50/h, no evidence of fluid overload Hyponatremia-improved, good urine output and expect creatinine to turn around  Influenza pneumonia-continue ceftriaxone and azithro and Tamiflu. HAART  adjusted per pharmacy  Not much improvement last 48 hours, guarded prognosis due to poor underlying lung function  The patient is critically ill with multiple organ systems failure and requires high complexity decision making for assessment and support, frequent evaluation and titration of therapies, application of advanced monitoring technologies and extensive interpretation of multiple databases. Critical Care Time devoted to patient care services described in this  note independent of APP/resident  time is 35 minutes.   Comer Locketakesh V. Vassie LollAlva MD

## 2017-10-04 NOTE — Progress Notes (Signed)
PULMONARY / CRITICAL CARE MEDICINE   Name: Chris RiversJerome A Wolven Jr. MRN: [id number] DOB: 11/01/1965    ADMISSION DATE:  10/01/2017 CONSULTATION DATE:  2/21  REFERRING MD:  Juleen ChinaKohut  CHIEF COMPLAINT:  SOB  HISTORY OF PRESENT ILLNESS:   Chris FavorsJerome A Feldhaus [name] is a 52 y.o. male with medical history significant for HIV disease on Biktarvy, atrial fibrillation on Eliquis, COPD on prn nasal cannula oxygen, and diabetes on oral agent.  Patient is also obese.  He reports that for 3 days he has had rhinorrhea, generalized aching, cough with productive white sputum.  He has utilized his home MDIs without relief.  He forgot to use his nebulizers.  He has not had any nausea, vomiting or diarrhea but has not had much appetite.  He felt hot but stated he did not take his temperature.  He also increased his oxygen to 3 L/min from a baseline of 2 L but did not have any improvement in his difficulty breathing.  After arrival to the ER he has required stairstep increases in his oxygen due to persistent hypoxemia and currently is on high flow nasal cannula at 15 L/min.  He is still tachypneic and tachycardic.  He was found to be febrile with an oral temperature of 101.9 F.  Influenza PCR pending.  Chest x-ray without focal infiltrate.  He has been treated presumptively as COPD exacerbation with possible bronchitis and has been given a dose of oral prednisone.  Levaquin was also ordered but given patient's recent hospitalization/discharge on 07/28/17 I have opted to utilize broad-spectrum empiric antibiotic coverage.  Patient's last CD4 count was 430 on 2/15 with a viral load of 22. Because of increased work of breathing and persistent tachycardia/tachypnea patient was placed on bipap and admitted to ICU, intubated shortly after.  PAST MEDICAL HISTORY :  He  has a past medical history of Atrial fibrillation (HCC), COPD (chronic obstructive pulmonary disease) (HCC), Depression, Diabetes mellitus without complication (HCC), GERD  (gastroesophageal reflux disease), and HIV disease (HCC).  PAST SURGICAL HISTORY: He  has a past surgical history that includes Irrigation and debridement abscess (Right, 04/10/2013); Exploratory laparotomy; Incision and drainage abscess (Right, 02/20/2015); knee FRACTURE SURGERY  (Right); arm surgery (Left); Esophagogastroduodenoscopy (egd) with propofol (N/A, 09/03/2016); and Colonoscopy with propofol (N/A, 09/03/2016).  Allergies  Allergen Reactions  . Bactrim [Sulfamethoxazole-Trimethoprim] Hives    No current facility-administered medications on file prior to encounter.    Current Outpatient Medications on File Prior to Encounter  Medication Sig  . acetaminophen-codeine (TYLENOL #3) 300-30 MG tablet Take 1 tablet by mouth every 8 (eight) hours as needed for moderate pain.  [name]. albuterol (PROVENTIL HFA;VENTOLIN HFA) 108 (90 Base) MCG/ACT inhaler Inhale 2 puffs into the lungs every 4 (four) hours as needed for wheezing or shortness of breath (((PLAN B))).  [name]. apixaban (ELIQUIS) 5 MG TABS tablet Take 1 tablet (5 mg total) by mouth 2 (two) times daily.  . bictegravir-emtricitabine-tenofovir AF (BIKTARVY) 50-200-25 MG TABS tablet Take 1 tablet by mouth daily. (Patient taking differently: Take 1 tablet by mouth daily at 3 pm. )  . clotrimazole-betamethasone (LOTRISONE) cream Apply 1 application topically 2 (two) times daily.  [name]. diltiazem (CARDIZEM CD) 360 MG 24 hr capsule Take 1 capsule (360 mg total) by mouth daily.  . furosemide (LASIX) 20 MG tablet Take 1 tablet (20 mg total) by mouth daily.  . metFORMIN (GLUCOPHAGE) 1000 MG tablet Take 1 tablet (1,000 mg total) by mouth 2 (two) times daily with a meal.  .  SPIRIVA RESPIMAT 2.5 MCG/ACT AERS INHALE 2 PUFFS INTO THE LUNGS DAILY  . terbinafine (LAMISIL) 250 MG tablet Take 1 tablet (250 mg total) by mouth daily.  . Testosterone 12.5 MG/ACT (1%) GEL Place 1 Pump onto the skin daily.  [name] ACCU-CHEK SOFTCLIX LANCETS lancets Use as instructed (Patient not  taking: Reported on 10/01/2017)  . atorvastatin (LIPITOR) 20 MG tablet Take 1 tablet (20 mg total) by mouth daily.  . budesonide-formoterol (SYMBICORT) 80-4.5 MCG/ACT inhaler Take 2 puffs first thing in am and then another 2 puffs about 12 hours later.  . clotrimazole (LOTRIMIN) 1 % cream Apply 1 application topically 2 (two) times daily.  . clotrimazole (MYCELEX) 10 MG troche Take 1 tablet (10 mg total) by mouth 5 (five) times daily.  [name] glucose blood test strip Use as instructed (Patient not taking: Reported on 10/01/2017)  . ipratropium-albuterol (DUONEB) 0.5-2.5 (3) MG/3ML SOLN Take 3 mLs by nebulization every 6 (six) hours as needed.  . OXYGEN Place 2 L into the nose as needed (shortness of breath).   . pantoprazole (PROTONIX) 40 MG tablet Take 1 tablet (40 mg total) by mouth 2 (two) times daily before a meal. (Patient taking differently: Take 40 mg by mouth 3 times/day as needed-between meals & bedtime. )  . sildenafil (VIAGRA) 50 MG tablet Take 0.5 tablets (25 mg total) by mouth daily as needed for erectile dysfunction.  . urea (CARMOL) 20 % cream Apply as needed topically.    FAMILY HISTORY:  His indicated that his mother is deceased. He indicated that his father is deceased. He indicated that the status of his maternal grandmother is unknown. He indicated that his maternal grandfather is alive.   SOCIAL HISTORY: He  reports that he has been smoking cigarettes.  He has a 4.00 pack-year smoking history. he has never used smokeless tobacco. He reports that he does not drink alcohol or use drugs.  REVIEW OF SYSTEMS:   Unable to obtain  SUBJECTIVE:  Intubated, sedated. Increase hypoxic overnight. Vent settings adjusted.   VITAL SIGNS: BP 130/67   Pulse 95   Temp 99.7 F (37.6 C)   Resp (!) 30   Ht 5\' 9"  (1.753 m)   Wt 272 lb 14.9 oz (123.8 kg)   SpO2 92%   BMI 40.30 kg/m   HEMODYNAMICS:    VENTILATOR SETTINGS: Vent Mode: PRVC FiO2 (%):  [80 %] 80 % Set Rate:  [18 bmp-30  bmp] 22 bmp Vt Set:  [570 mL-600 mL] 600 mL PEEP:  [10 cmH20] 10 cmH20 Plateau Pressure:  [21 cmH20-27 cmH20] 27 cmH20  INTAKE / OUTPUT: I/O last 3 completed shifts: In: 6709.7 [I.V.:4909.7; NG/GT:900; IV Piggyback:900] Out: 3685 [Urine:3685]  PHYSICAL EXAMINATION: General:  Obese man, laying in bed, sedated, intubated Neuro:  Sedated. PERRL HEENT:  ETT, OGT in place Cardiovascular:  RRR, no MRG Lungs:  CTAB.  Abdomen:  Obese, soft, distended, +BS Musculoskeletal:  SCD's in place. Warm. No edema Skin:  In tact  LABS:  BMET Recent Labs  Lab 10/02/17 0906 10/03/17 0305 10/04/17 0226  NA 130* 135 135  K 5.1 4.8 4.4  CL 97* 102 105  CO2 22 21* 19*  BUN 24* 37* 55*  CREATININE 1.81* 2.09* 2.08*  GLUCOSE 428* 265* 318*    Electrolytes Recent Labs  Lab 10/02/17 0327 10/02/17 0906 10/03/17 0305 10/04/17 0226  CALCIUM 8.1* 7.7* 8.2* 8.6*  MG 2.1  --  2.4 2.9*  PHOS 6.7*  --  4.6 3.4  CBC Recent Labs  Lab 10/02/17 0327 10/03/17 0305 10/04/17 0226  WBC 9.6 10.7* 11.7*  HGB 13.4 12.3* 11.8*  HCT 43.2 40.7 39.3  PLT 212 164 172    Coag's Recent Labs  Lab 10/01/17 2023  APTT 35  INR 1.13    Sepsis Markers Recent Labs  Lab 10/01/17 1515 10/01/17 1748  LATICACIDVEN 1.82  --   PROCALCITON  --  0.12    ABG Recent Labs  Lab 10/03/17 2038 10/03/17 2336 10/04/17 0337  PHART 7.141* 7.272* 7.305*  PCO2ART 76.2* 50.4* 49.4*  PO2ART 82.0* 73.0* 64.0*    Liver Enzymes Recent Labs  Lab 10/02/17 0327  AST 23  ALT 18  ALKPHOS 44  BILITOT 0.8  ALBUMIN 3.4*    Cardiac Enzymes Recent Labs  Lab 10/01/17 2023  TROPONINI <0.03    Glucose Recent Labs  Lab 10/03/17 1148 10/03/17 1621 10/03/17 1948 10/04/17 0019 10/04/17 0353 10/04/17 0813  GLUCAP 261* 295* 266* 329* 321* 283*    Imaging Dg Chest Port 1 View  Result Date: 10/04/2017 CLINICAL DATA:  Pneumonia.  Ventilator support. EXAM: PORTABLE CHEST 1 VIEW COMPARISON:  10/03/2017  FINDINGS: Endotracheal tube tip is 5 cm above the carina. Nasogastric tube enters the stomach. Patchy infiltrate at the lung bases right more than left persists. No worsening or new finding. IMPRESSION: Tubes well positioned. Persistent patchy basilar infiltrate right more than left. No new finding. Electronically Signed   By: Paulina FusiMark  Shogry M.D.   On: 10/04/2017 07:27    STUDIES:  CXR 2/21 >> no frank edema or consolidation CXR 2/22>> Endotracheal tube above the carina. Enteric tube extends into the left upper abdomen with tip and side-port likely in the proximal to mid stomach. Cardiomegaly with bibasilar atelectatic changes. CXR 2/23 >>Increased atelectasis at the right lung base CXR 2/24>> Persistent patchy basilar infiltrates R>L  CULTURES: RVP >Flu positive Tracheal aspirate > Pending Blood cx > NGTD  ANTIBIOTICS: Vancomycin 2/21 >>2/22 Cefepime 2/21 >> Tamiflu 2/21 >> Fluconazole 2/21 >>  SIGNIFICANT EVENTS: Admitted 2/21, intubated  LINES/TUBES: ETT 2/22 >>  DISCUSSION: 51yom with COPD on PRN oxygen presented with ~2 day h/o SOB. Now with marked respiratory distress, remains SOB on BiPAP. Intubated. Influenza positive, occult pneumonia; superimposed on COPD exacerbation. Tamiflu, broad-spectrum abx, steroids, nebs.  ASSESSMENT / PLAN:  PULMONARY A: Acute on chronic respiratory failure w/ hypoxia. ABG show mild acidosis with O2 sat 88%. Continue to hypoxic overnight with slight improvement with vent settings adjustment. COPD P:   Wean vent support as able Continue Steroids- solumedrol 60 mg bid Continue Xopenex and atrovent nebs Continue Brovana, pulmicort nebs Continue antibiotics for HCAP  CARDIOVASCULAR A:  History atrial fibrillation. TTE was ordered after patient continue to be hypoxic while receiving IVF. BNP was within normal. Last Echo with EF 60-65%, normal wall motion w/G1DD. HTN P:  Follow up on Echo Continue Metoprolol Continue Eliquis Monitor on  telemetry  RENAL A:   AKI- FeNa <1%. Prerenal in the setting of poor intake due to flu and COPD exacerbation. Creat stable while on IVF. 2.09>>2.08. Good urine output, expect continued improvement in renal function Hyperkalemia-resolved P:   Continue IVF 75 cc/hr Monitor BMP  GASTROINTESTINAL A:   Obesity SUP P:   IV PPI Tube feeds at goal (on propofol) Bowel regimen  HEMATOLOGIC A:   On Eliquis. Hemoglobin and platelets are stable. P:  Monitor CBC  INFECTIOUS A:   HIV last CD4 >400, viral load 22. Flu positive. Afebrile. Worsening RLL  atelectasis. Blood cultures NGTD. Fungal coverage for esophageal candidiasis noted during intubation. P:   Continue Cefepime Continue Tivicay, Descovay Continue Tamiflu Continue Diflucan Follow up on blood and aspirate cultures   ENDOCRINE A:   Type 2 DM  - Last A1C 10.5. On steroid bid P:   Increase Lantus to 30u daily Moderate SSI- may need resistant while on steroids CBG q4   NEUROLOGIC  A:   Sedated for intubation P:   RASS goal: 0, -1 Continue sedation w/ propofol, versed, fentanyl  FAMILY  - Updates: no family at bedside 2/22 - Inter-disciplinary family meet or Palliative Care meeting due by: 3/1   Lovena Neighbours, MD [facility], PGY-2  10/04/2017 9:30 AM

## 2017-10-04 NOTE — Progress Notes (Signed)
150 Fentanyl wasted in sink by Coca ColaHaleigh RN

## 2017-10-05 ENCOUNTER — Inpatient Hospital Stay (HOSPITAL_COMMUNITY): Payer: Medicare HMO

## 2017-10-05 DIAGNOSIS — J80 Acute respiratory distress syndrome: Principal | ICD-10-CM

## 2017-10-05 DIAGNOSIS — I340 Nonrheumatic mitral (valve) insufficiency: Secondary | ICD-10-CM

## 2017-10-05 DIAGNOSIS — B20 Human immunodeficiency virus [HIV] disease: Secondary | ICD-10-CM

## 2017-10-05 LAB — CBC WITH DIFFERENTIAL/PLATELET
BASOS ABS: 0 10*3/uL (ref 0.0–0.1)
Band Neutrophils: 0 %
Basophils Relative: 0 %
Blasts: 0 %
EOS PCT: 0 %
Eosinophils Absolute: 0 10*3/uL (ref 0.0–0.7)
HCT: 39.5 % (ref 39.0–52.0)
HEMOGLOBIN: 11.7 g/dL — AB (ref 13.0–17.0)
LYMPHS ABS: 0.6 10*3/uL — AB (ref 0.7–4.0)
Lymphocytes Relative: 7 %
MCH: 28.7 pg (ref 26.0–34.0)
MCHC: 29.6 g/dL — ABNORMAL LOW (ref 30.0–36.0)
MCV: 96.8 fL (ref 78.0–100.0)
METAMYELOCYTES PCT: 0 %
MONO ABS: 0.4 10*3/uL (ref 0.1–1.0)
MYELOCYTES: 0 %
Monocytes Relative: 5 %
NEUTROS PCT: 88 %
NRBC: 0 /100{WBCs}
Neutro Abs: 7.3 10*3/uL (ref 1.7–7.7)
Other: 0 %
PLATELETS: 178 10*3/uL (ref 150–400)
Promyelocytes Absolute: 0 %
RBC: 4.08 MIL/uL — AB (ref 4.22–5.81)
RDW: 13.2 % (ref 11.5–15.5)
WBC: 8.3 10*3/uL (ref 4.0–10.5)

## 2017-10-05 LAB — BLOOD GAS, ARTERIAL
ACID-BASE DEFICIT: 0.4 mmol/L (ref 0.0–2.0)
Acid-base deficit: 1.5 mmol/L (ref 0.0–2.0)
BICARBONATE: 24.9 mmol/L (ref 20.0–28.0)
Bicarbonate: 25.9 mmol/L (ref 20.0–28.0)
Drawn by: 249101
Drawn by: 44135
FIO2: 80
FIO2: 90
LHR: 22 {breaths}/min
LHR: 22 {breaths}/min
O2 SAT: 95.1 %
O2 SAT: 95.1 %
PATIENT TEMPERATURE: 98.6
PATIENT TEMPERATURE: 100.6
PCO2 ART: 59.5 mmHg — AB (ref 32.0–48.0)
PCO2 ART: 64.1 mmHg — AB (ref 32.0–48.0)
PEEP/CPAP: 10 cmH2O
PEEP: 10 cmH2O
PH ART: 7.238 — AB (ref 7.350–7.450)
VT: 570 mL
VT: 600 mL
pH, Arterial: 7.244 — ABNORMAL LOW (ref 7.350–7.450)
pO2, Arterial: 88 mmHg (ref 83.0–108.0)
pO2, Arterial: 90.8 mmHg (ref 83.0–108.0)

## 2017-10-05 LAB — GLUCOSE, CAPILLARY
GLUCOSE-CAPILLARY: 242 mg/dL — AB (ref 65–99)
GLUCOSE-CAPILLARY: 289 mg/dL — AB (ref 65–99)
Glucose-Capillary: 249 mg/dL — ABNORMAL HIGH (ref 65–99)
Glucose-Capillary: 267 mg/dL — ABNORMAL HIGH (ref 65–99)
Glucose-Capillary: 278 mg/dL — ABNORMAL HIGH (ref 65–99)
Glucose-Capillary: 334 mg/dL — ABNORMAL HIGH (ref 65–99)

## 2017-10-05 LAB — CULTURE, RESPIRATORY W GRAM STAIN: Culture: NORMAL

## 2017-10-05 LAB — BASIC METABOLIC PANEL
ANION GAP: 6 (ref 5–15)
ANION GAP: 9 (ref 5–15)
Anion gap: 10 (ref 5–15)
BUN: 68 mg/dL — ABNORMAL HIGH (ref 6–20)
BUN: 77 mg/dL — ABNORMAL HIGH (ref 6–20)
BUN: 78 mg/dL — ABNORMAL HIGH (ref 6–20)
CHLORIDE: 110 mmol/L (ref 101–111)
CHLORIDE: 110 mmol/L (ref 101–111)
CO2: 25 mmol/L (ref 22–32)
CO2: 25 mmol/L (ref 22–32)
CO2: 26 mmol/L (ref 22–32)
CREATININE: 1.57 mg/dL — AB (ref 0.61–1.24)
CREATININE: 1.62 mg/dL — AB (ref 0.61–1.24)
Calcium: 9.2 mg/dL (ref 8.9–10.3)
Calcium: 9.5 mg/dL (ref 8.9–10.3)
Calcium: 9.5 mg/dL (ref 8.9–10.3)
Chloride: 111 mmol/L (ref 101–111)
Creatinine, Ser: 1.64 mg/dL — ABNORMAL HIGH (ref 0.61–1.24)
GFR calc non Af Amer: 49 mL/min — ABNORMAL LOW (ref >60)
GFR calc non Af Amer: 48 mL/min — ABNORMAL LOW (ref >60)
GFR, EST AFRICAN AMERICAN: 54 mL/min — AB (ref >60)
GFR, EST AFRICAN AMERICAN: 57 mL/min — AB (ref >60)
GFR, EST AFRICAN AMERICAN: 55 mL/min — AB (ref >60)
GFR, EST NON AFRICAN AMERICAN: 47 mL/min — AB (ref >60)
GLUCOSE: 281 mg/dL — AB (ref 65–99)
GLUCOSE: 331 mg/dL — AB (ref 65–99)
GLUCOSE: 353 mg/dL — AB (ref 65–99)
POTASSIUM: 4.9 mmol/L (ref 3.5–5.1)
Potassium: 6 mmol/L — ABNORMAL HIGH (ref 3.5–5.1)
Potassium: 5.1 mmol/L (ref 3.5–5.1)
SODIUM: 142 mmol/L (ref 135–145)
Sodium: 145 mmol/L (ref 135–145)
Sodium: 145 mmol/L (ref 135–145)

## 2017-10-05 LAB — ECHOCARDIOGRAM COMPLETE
HEIGHTINCHES: 69 in
Weight: 4331.6 oz

## 2017-10-05 LAB — PHOSPHORUS: Phosphorus: 3.8 mg/dL (ref 2.5–4.6)

## 2017-10-05 LAB — CULTURE, RESPIRATORY

## 2017-10-05 LAB — MAGNESIUM: MAGNESIUM: 3.1 mg/dL — AB (ref 1.7–2.4)

## 2017-10-05 LAB — TRIGLYCERIDES: TRIGLYCERIDES: 230 mg/dL — AB (ref <150)

## 2017-10-05 MED ORDER — INSULIN GLARGINE 100 UNIT/ML ~~LOC~~ SOLN
45.0000 [IU] | Freq: Every day | SUBCUTANEOUS | Status: DC
Start: 2017-10-05 — End: 2017-10-06
  Administered 2017-10-05: 45 [IU] via SUBCUTANEOUS
  Filled 2017-10-05 (×2): qty 0.45

## 2017-10-05 MED ORDER — HYDRALAZINE HCL 20 MG/ML IJ SOLN
20.0000 mg | INTRAMUSCULAR | Status: DC | PRN
  Administered 2017-10-05 (×2): 20 mg via INTRAVENOUS
  Filled 2017-10-05 (×2): qty 1

## 2017-10-05 MED ORDER — OSELTAMIVIR PHOSPHATE 6 MG/ML PO SUSR
75.0000 mg | Freq: Two times a day (BID) | ORAL | Status: AC
  Administered 2017-10-05 – 2017-10-06 (×3): 75 mg
  Filled 2017-10-05 (×3): qty 12.5

## 2017-10-05 MED ORDER — HYDRALAZINE HCL 20 MG/ML IJ SOLN
10.0000 mg | INTRAMUSCULAR | Status: DC | PRN
Start: 2017-10-05 — End: 2017-10-05

## 2017-10-05 MED ORDER — SODIUM CHLORIDE 0.9 % IV SOLN
0.4000 ug/kg/h | INTRAVENOUS | Status: DC
  Administered 2017-10-05: 0.4 ug/kg/h via INTRAVENOUS
  Filled 2017-10-05: qty 2

## 2017-10-05 MED ORDER — FUROSEMIDE 10 MG/ML IJ SOLN
40.0000 mg | Freq: Four times a day (QID) | INTRAMUSCULAR | Status: AC
  Administered 2017-10-05 (×3): 40 mg via INTRAVENOUS
  Filled 2017-10-05 (×4): qty 4

## 2017-10-05 MED ORDER — ACETAMINOPHEN 160 MG/5ML PO SOLN
650.0000 mg | Freq: Four times a day (QID) | ORAL | Status: DC | PRN
  Administered 2017-10-05 – 2017-10-13 (×6): 650 mg
  Filled 2017-10-05 (×6): qty 20.3

## 2017-10-05 MED ORDER — HYDRALAZINE HCL 20 MG/ML IJ SOLN
10.0000 mg | INTRAMUSCULAR | Status: DC | PRN
Start: 2017-10-05 — End: 2017-10-05
  Administered 2017-10-05 (×2): 10 mg via INTRAVENOUS
  Filled 2017-10-05 (×2): qty 1

## 2017-10-05 MED ORDER — BISACODYL 10 MG RE SUPP
10.0000 mg | Freq: Every day | RECTAL | Status: DC | PRN
  Administered 2017-10-05: 10 mg via RECTAL
  Filled 2017-10-05 (×2): qty 1

## 2017-10-05 MED ORDER — IBUPROFEN 100 MG/5ML PO SUSP
400.0000 mg | Freq: Four times a day (QID) | ORAL | Status: DC | PRN
  Administered 2017-10-05 – 2017-10-06 (×2): 400 mg
  Filled 2017-10-05 (×4): qty 20

## 2017-10-05 MED ORDER — DEXMEDETOMIDINE HCL IN NACL 400 MCG/100ML IV SOLN
0.4000 ug/kg/h | INTRAVENOUS | Status: DC
  Administered 2017-10-05: 0.8 ug/kg/h via INTRAVENOUS
  Administered 2017-10-05 – 2017-10-06 (×5): 1.2 ug/kg/h via INTRAVENOUS
  Administered 2017-10-06: 2 ug/kg/h via INTRAVENOUS
  Administered 2017-10-06: 1.199 ug/kg/h via INTRAVENOUS
  Administered 2017-10-06 (×2): 1.2 ug/kg/h via INTRAVENOUS
  Filled 2017-10-05: qty 100
  Filled 2017-10-05: qty 200
  Filled 2017-10-05: qty 100
  Filled 2017-10-05: qty 200
  Filled 2017-10-05 (×6): qty 100

## 2017-10-05 NOTE — Progress Notes (Signed)
eLink Physician-Brief Progress Note Patient Name: Chris RiversJerome A Yontz Jr. DOB: 07/21/1966 MRN: [id number]   Date of Service  10/05/2017  HPI/Events of Note  Patient on Lasix Q 6 hours and has put out 4 liters of urine today. Bedside nurse requests BMP to check K+.   eICU Interventions  Will order BMP STAT.     Intervention Category Major Interventions: Electrolyte abnormality - evaluation and management  Sommer,Steven Dennard Nipugene 10/05/2017, 7:53 PM

## 2017-10-05 NOTE — Progress Notes (Signed)
eLink Physician-Brief Progress Note Patient Name: Chris RiversJerome A Sayer Jr. DOB: 01/08/1966 MRN: [id number]   Date of Service  10/05/2017  HPI/Events of Note  Fever to 102.6 F - Request for Motrin and Cooling blanket. Creatinine = 1.57.   eICU Interventions  Will order: 1. Motrin liquid 400 mg per tube Q 4 hours PRN Temp > 100.5 F. 2. Apply cooling blanket.      Intervention Category Major Interventions: Infection - evaluation and management  Sommer,Steven Eugene 10/05/2017, 10:13 PM

## 2017-10-05 NOTE — Progress Notes (Deleted)
eLink Physician-Brief Progress Note Patient Name: Chris [name]. DOB: 03/24/1966 MRN: [id number]   Date of Service  10/05/2017  HPI/Events of Note  K+ = 2.7 and Creatinine = 2.72. Currently on a Lasix IV infusion with 2+ liters urine output today.   eICU Interventions  Will replace K+.      Intervention Category Major Interventions: Electrolyte abnormality - evaluation and management  Sommer,Steven Eugene 10/05/2017, 10:15 PM

## 2017-10-05 NOTE — Progress Notes (Signed)
PHARMACY NOTE:  ANTIMICROBIAL RENAL DOSAGE ADJUSTMENT  Current antimicrobial regimen includes a mismatch between antimicrobial dosage and estimated renal function.  As per policy approved by the Pharmacy & Therapeutics and Medical Executive Committees, the antimicrobial dosage will be adjusted accordingly.  Current antimicrobial dosage:  Tamiflu 30 mg BID  Indication: Flu A positive   Renal Function: CrCl ~ 55 ml/min  Estimated Creatinine Clearance: 69 mL/min (A) (by C-G formula based on SCr of 1.64 mg/dL (H)). []      On intermittent HD, scheduled: []      On CRRT    Antimicrobial dosage has been changed to:  Tamiflu 75 mg BID   Additional comments:   Thank you for allowing pharmacy to be a part of this patient's care.  Sharin MonsEmily Petros Ahart, PharmD, BCPS PGY2 Infectious Diseases Pharmacy Resident Pager: [phone]  10/05/2017 7:57 AM

## 2017-10-05 NOTE — Progress Notes (Addendum)
PULMONARY / CRITICAL CARE MEDICINE   Name: Chris RiversJerome A Wolven Jr. MRN: [id number] DOB: 11/01/1965    ADMISSION DATE:  10/01/2017 CONSULTATION DATE:  2/21  REFERRING MD:  Chris [name]  CHIEF COMPLAINT:  SOB  HISTORY OF PRESENT ILLNESS:   Chris FavorsJerome A Feldhaus [name] is a 52 y.o. male with medical history significant for HIV disease on Biktarvy, atrial fibrillation on Eliquis, COPD on prn nasal cannula oxygen, and diabetes on oral agent.  Patient is also obese.  He reports that for 3 days he has had rhinorrhea, generalized aching, cough with productive white sputum.  He has utilized his home MDIs without relief.  He forgot to use his nebulizers.  He has not had any nausea, vomiting or diarrhea but has not had much appetite.  He felt hot but stated he did not take his temperature.  He also increased his oxygen to 3 L/min from a baseline of 2 L but did not have any improvement in his difficulty breathing.  After arrival to the ER he has required stairstep increases in his oxygen due to persistent hypoxemia and currently is on high flow nasal cannula at 15 L/min.  He is still tachypneic and tachycardic.  He was found to be febrile with an oral temperature of 101.9 F.  Influenza PCR pending.  Chest x-ray without focal infiltrate.  He has been treated presumptively as COPD exacerbation with possible bronchitis and has been given a dose of oral prednisone.  Levaquin was also ordered but given patient's recent hospitalization/discharge on 07/28/17 I have opted to utilize broad-spectrum empiric antibiotic coverage.  Patient's last CD4 count was 430 on 2/15 with a viral load of 22. Because of increased work of breathing and persistent tachycardia/tachypnea patient was placed on bipap and admitted to ICU, intubated shortly after.  PAST MEDICAL HISTORY :  He  has a past medical history of Atrial fibrillation (HCC), COPD (chronic obstructive pulmonary disease) (HCC), Depression, Diabetes mellitus without complication (HCC), GERD  (gastroesophageal reflux disease), and HIV disease (HCC).  PAST SURGICAL HISTORY: He  has a past surgical history that includes Irrigation and debridement abscess (Right, 04/10/2013); Exploratory laparotomy; Incision and drainage abscess (Right, 02/20/2015); knee FRACTURE SURGERY  (Right); arm surgery (Left); Esophagogastroduodenoscopy (egd) with propofol (N/A, 09/03/2016); and Colonoscopy with propofol (N/A, 09/03/2016).  Allergies  Allergen Reactions  . Bactrim [Sulfamethoxazole-Trimethoprim] Hives    No current facility-administered medications on file prior to encounter.    Current Outpatient Medications on File Prior to Encounter  Medication Sig  . acetaminophen-codeine (TYLENOL #3) 300-30 MG tablet Take 1 tablet by mouth every 8 (eight) hours as needed for moderate pain.  [name]. albuterol (PROVENTIL HFA;VENTOLIN HFA) 108 (90 Base) MCG/ACT inhaler Inhale 2 puffs into the lungs every 4 (four) hours as needed for wheezing or shortness of breath (((PLAN B))).  [name]. apixaban (ELIQUIS) 5 MG TABS tablet Take 1 tablet (5 mg total) by mouth 2 (two) times daily.  . bictegravir-emtricitabine-tenofovir AF (BIKTARVY) 50-200-25 MG TABS tablet Take 1 tablet by mouth daily. (Patient taking differently: Take 1 tablet by mouth daily at 3 pm. )  . clotrimazole-betamethasone (LOTRISONE) cream Apply 1 application topically 2 (two) times daily.  [name]. diltiazem (CARDIZEM CD) 360 MG 24 hr capsule Take 1 capsule (360 mg total) by mouth daily.  . furosemide (LASIX) 20 MG tablet Take 1 tablet (20 mg total) by mouth daily.  . metFORMIN (GLUCOPHAGE) 1000 MG tablet Take 1 tablet (1,000 mg total) by mouth 2 (two) times daily with a meal.  .  SPIRIVA RESPIMAT 2.5 MCG/ACT AERS INHALE 2 PUFFS INTO THE LUNGS DAILY  . terbinafine (LAMISIL) 250 MG tablet Take 1 tablet (250 mg total) by mouth daily.  . Testosterone 12.5 MG/ACT (1%) GEL Place 1 Pump onto the skin daily.  [name] ACCU-CHEK SOFTCLIX LANCETS lancets Use as instructed (Patient not  taking: Reported on 10/01/2017)  . atorvastatin (LIPITOR) 20 MG tablet Take 1 tablet (20 mg total) by mouth daily.  . budesonide-formoterol (SYMBICORT) 80-4.5 MCG/ACT inhaler Take 2 puffs first thing in am and then another 2 puffs about 12 hours later.  . clotrimazole (LOTRIMIN) 1 % cream Apply 1 application topically 2 (two) times daily.  . clotrimazole (MYCELEX) 10 MG troche Take 1 tablet (10 mg total) by mouth 5 (five) times daily.  [name] glucose blood test strip Use as instructed (Patient not taking: Reported on 10/01/2017)  . ipratropium-albuterol (DUONEB) 0.5-2.5 (3) MG/3ML SOLN Take 3 mLs by nebulization every 6 (six) hours as needed.  . OXYGEN Place 2 L into the nose as needed (shortness of breath).   . pantoprazole (PROTONIX) 40 MG tablet Take 1 tablet (40 mg total) by mouth 2 (two) times daily before a meal. (Patient taking differently: Take 40 mg by mouth 3 times/day as needed-between meals & bedtime. )  . sildenafil (VIAGRA) 50 MG tablet Take 0.5 tablets (25 mg total) by mouth daily as needed for erectile dysfunction.  . urea (CARMOL) 20 % cream Apply as needed topically.    FAMILY HISTORY:  His indicated that his mother is deceased. He indicated that his father is deceased. He indicated that the status of his maternal grandmother is unknown. He indicated that his maternal grandfather is alive.   SOCIAL HISTORY: He  reports that he has been smoking cigarettes.  He has a 4.00 pack-year smoking history. he has never used smokeless tobacco. He reports that he does not drink alcohol or use drugs.  REVIEW OF SYSTEMS:   Unable to obtain  SUBJECTIVE:  Intubated, sedated. No changes overnight, oxygen was increased.  VITAL SIGNS: BP (!) 155/0   Pulse 96   Temp 100 F (37.8 C) (Bladder)   Resp (!) 22   Ht 5\' 9"  (1.753 m)   Wt 270 lb 11.6 oz (122.8 kg)   SpO2 91%   BMI 39.98 kg/m   HEMODYNAMICS:    VENTILATOR SETTINGS: Vent Mode: PRVC FiO2 (%):  [80 %-90 %] 90 % Set Rate:  [22  bmp] 22 bmp Vt Set:  [600 mL] 600 mL PEEP:  [10 cmH20] 10 cmH20 Plateau Pressure:  [23 cmH20-27 cmH20] 25 cmH20  INTAKE / OUTPUT: I/O last 3 completed shifts: In: 6236.8 [I.V.:4386.8; NG/GT:1050; IV Piggyback:800] Out: 5185 [Urine:5185]  PHYSICAL EXAMINATION: General:  Obese man, laying in bed, sedated, intubated Neuro:  Sedated. PERRL HEENT:  ETT, OGT in place Cardiovascular:  RRR, no MRG Lungs:  CTAB.  Abdomen:  Obese, soft, distended, +BS Musculoskeletal:  SCD's in place. Warm. No edema Skin:  In tact  LABS:  BMET Recent Labs  Lab 10/03/17 0305 10/04/17 0226 10/05/17 0314  NA 135 135 142  K 4.8 4.4 4.9  CL 102 105 111  CO2 21* 19* 25  BUN 37* 55* 68*  CREATININE 2.09* 2.08* 1.64*  GLUCOSE 265* 318* 281*   Electrolytes Recent Labs  Lab 10/03/17 0305 10/04/17 0226 10/05/17 0314  CALCIUM 8.2* 8.6* 9.2  MG 2.4 2.9* 3.1*  PHOS 4.6 3.4 3.8   CBC Recent Labs  Lab 10/03/17 0305 10/04/17 0226 10/05/17 0314  WBC 10.7* 11.7* 8.3  HGB 12.3* 11.8* 11.7*  HCT 40.7 39.3 39.5  PLT 164 172 178   Coag's Recent Labs  Lab 10/01/17 2023  APTT 35  INR 1.13    Sepsis Markers Recent Labs  Lab 10/01/17 1515 10/01/17 1748  LATICACIDVEN 1.82  --   PROCALCITON  --  0.12    ABG Recent Labs  Lab 10/03/17 2336 10/04/17 0337 10/05/17 0418  PHART 7.272* 7.305* 7.238*  PCO2ART 50.4* 49.4* 64.1*  PO2ART 73.0* 64.0* 90.8    Liver Enzymes Recent Labs  Lab 10/02/17 0327  AST 23  ALT 18  ALKPHOS 44  BILITOT 0.8  ALBUMIN 3.4*    Cardiac Enzymes Recent Labs  Lab 10/01/17 2023  TROPONINI <0.03    Glucose Recent Labs  Lab 10/04/17 0813 10/04/17 1208 10/04/17 1642 10/04/17 1944 10/05/17 0011 10/05/17 0356  GLUCAP 283* 328* 297* 242* 278* 249*    Imaging Dg Chest Port 1 View  Result Date: 10/05/2017 CLINICAL DATA:  Hypoxia EXAM: PORTABLE CHEST 1 VIEW COMPARISON:  October 04, 2017 FINDINGS: Endotracheal tube tip is 5.5 cm above the carina.  Nasogastric tube tip and side port are below the diaphragm. No pneumothorax. There is airspace consolidation in the left lower lobe. There is a small right pleural effusion with right base atelectasis. Lungs elsewhere clear. Heart is mildly enlarged with pulmonary vascularity within normal limits. No adenopathy. No bone lesions. IMPRESSION: Tube positions as described without pneumothorax. Consolidation consistent with a degree of pneumonia left base. Small right pleural effusion with right base atelectasis. Stable cardiac prominence. Electronically Signed   By: Bretta Bang III M.D.   On: 10/05/2017 08:02    STUDIES:  CXR 2/21 >> no frank edema or consolidation CXR 2/22>> Endotracheal tube above the carina. Enteric tube extends into the left upper abdomen with tip and side-port likely in the proximal to mid stomach. Cardiomegaly with bibasilar atelectatic changes. CXR 2/23 >>Increased atelectasis at the right lung base CXR 2/24>> Persistent patchy basilar infiltrates R>L TTE 2/25 >   CULTURES: RVP >Flu A positive Tracheal aspirate 2/22 > rare G+ cocci and rods- reincubated Blood cx 2/21 > NGx3 days  ANTIBIOTICS: Vancomycin 2/21 >>2/22 Cefepime 2/21 >> Tamiflu 2/21 >> Fluconazole 2/21 >>  SIGNIFICANT EVENTS: Admitted 2/21, intubated  LINES/TUBES: ETT 2/22 >>  DISCUSSION: 51yom with COPD on PRN oxygen presented with ~2 day h/o SOB. Now with marked respiratory distress, remains SOB on BiPAP. Intubated. Influenza positive, occult pneumonia; superimposed on COPD exacerbation. Tamiflu, broad-spectrum abx, steroids, nebs.  ASSESSMENT / PLAN:  PULMONARY A: Acute on chronic respiratory failure w/ hypoxia COPD P:   Wean vent support as able Continue Steroids- solumedrol 60 mg bid Continue Xopenex and atrovent nebs Continue Brovana, pulmicort nebs Continue antibiotics for HCAP  CARDIOVASCULAR A:  History atrial fibrillation HTN P:  Echo pending Continue  Metoprolol Continue Eliquis Monitor on telemetry  RENAL A:   AKI- FeNa <1% Hyperkalemia-resolved P:   Continue IVF 75 cc/hr Monitor BMP  GASTROINTESTINAL A:   Obesity SUP P:   IV PPI Tube feeds at goal Bowel regimen  HEMATOLOGIC A:   On Eliquis. Hemoglobin and platelets are stable. P:  Monitor CBC  INFECTIOUS A:   HIV last CD4 >400, viral load 22.  Flu positive.  esophageal candidiasis noted during intubation. P:   Continue Cefepime Continue Tivicay, Descovay Continue Tamiflu Continue Diflucan Follow up on blood and aspirate cultures   ENDOCRINE A:   Type 2 DM  - Last  A1C 10.5 P:   Increase Lantus to 45u daily Resistant SSI CBG q4   NEUROLOGIC  A:   Sedated for intubation P:   RASS goal: 0, -1 Continue sedation w/ propofol and fentanyl Versed prn  FAMILY  - Updates: no family at bedside 2/25  - Inter-disciplinary family meet or Palliative Care meeting due by: 3/1  Dolores Patty, DO PGY-2, [facility] 10/05/2017 8:15 AM  Attending Note:  52 year old male with HIV and COPD presenting with acute respiratory failure and septic shock.  On exam, lungs are clear with distant breath sounds.  I reviewed CXR myself, ETT is in good position.  Level of hypoxemia is exaggerated for CXR finding.  Patient is chronically on anti-coagulation for atrial fibrillation.  Will increase RR to 26.  D/C propofol.  Precedex drip.  Bowel regiment.  Continue TF.  Increase lantus to 45.  Monitor CBGs.  PCCM will continue to follow.  The patient is critically ill with multiple organ systems failure and requires high complexity decision making for assessment and support, frequent evaluation and titration of therapies, application of advanced monitoring technologies and extensive interpretation of multiple databases.   Critical Care Time devoted to patient care services described in this note is  35  Minutes. This time reflects time of care of this signee Dr Koren Bound. This critical care time does not reflect procedure time, or teaching time or supervisory time of PA/NP/Med student/Med Resident etc but could involve care discussion time.  Alyson Reedy, M.D. [facility] Pulmonary/Critical Care Medicine. Pager: [phone]. After hours pager: [phone].

## 2017-10-05 NOTE — Progress Notes (Signed)
  Echocardiogram 2D Echocardiogram has been performed.  Chris [name], Chris [name] 10/05/2017, 9:50 AM

## 2017-10-06 ENCOUNTER — Inpatient Hospital Stay (HOSPITAL_COMMUNITY): Payer: Medicare HMO

## 2017-10-06 ENCOUNTER — Ambulatory Visit (HOSPITAL_COMMUNITY)
Admission: RE | Admit: 2017-10-06 | Payer: Medicare HMO | Source: Ambulatory Visit | Attending: Cardiology | Admitting: Cardiology

## 2017-10-06 DIAGNOSIS — J101 Influenza due to other identified influenza virus with other respiratory manifestations: Secondary | ICD-10-CM

## 2017-10-06 LAB — BLOOD GAS, ARTERIAL
ACID-BASE EXCESS: 2.5 mmol/L — AB (ref 0.0–2.0)
ACID-BASE EXCESS: 2.3 mmol/L — AB (ref 0.0–2.0)
BICARBONATE: 26.8 mmol/L (ref 20.0–28.0)
Bicarbonate: 27.6 mmol/L (ref 20.0–28.0)
Drawn by: 441351
Drawn by: 252031
FIO2: 0.9
FIO2: 80
LHR: 26 {breaths}/min
LHR: 30 {breaths}/min
MECHVT: 600 mL
O2 Saturation: 97.4 %
O2 Saturation: 97.2 %
PATIENT TEMPERATURE: 98.6
PCO2 ART: 44.1 mmHg (ref 32.0–48.0)
PEEP/CPAP: 16 cmH2O
PEEP: 10 cmH2O
PH ART: 7.402 (ref 7.350–7.450)
Patient temperature: 102.2
VT: 600 mL
pCO2 arterial: 59.6 mmHg — ABNORMAL HIGH (ref 32.0–48.0)
pH, Arterial: 7.301 — ABNORMAL LOW (ref 7.350–7.450)
pO2, Arterial: 99.5 mmHg (ref 83.0–108.0)
pO2, Arterial: 121 mmHg — ABNORMAL HIGH (ref 83.0–108.0)

## 2017-10-06 LAB — URINALYSIS, ROUTINE W REFLEX MICROSCOPIC
BILIRUBIN URINE: NEGATIVE
GLUCOSE, UA: NEGATIVE mg/dL
Ketones, ur: NEGATIVE mg/dL
Leukocytes, UA: NEGATIVE
NITRITE: NEGATIVE
PH: 6 (ref 5.0–8.0)
Protein, ur: 30 mg/dL — AB
SPECIFIC GRAVITY, URINE: 1.02 (ref 1.005–1.030)
Squamous Epithelial / LPF: NONE SEEN

## 2017-10-06 LAB — CBC WITH DIFFERENTIAL/PLATELET
BASOS ABS: 0 10*3/uL (ref 0.0–0.1)
BASOS PCT: 0 %
Eosinophils Absolute: 0 10*3/uL (ref 0.0–0.7)
Eosinophils Relative: 0 %
HEMATOCRIT: 42.9 % (ref 39.0–52.0)
HEMOGLOBIN: 13.1 g/dL (ref 13.0–17.0)
Lymphocytes Relative: 16 %
Lymphs Abs: 1.4 10*3/uL (ref 0.7–4.0)
MCH: 28.9 pg (ref 26.0–34.0)
MCHC: 30.5 g/dL (ref 30.0–36.0)
MCV: 94.5 fL (ref 78.0–100.0)
Monocytes Absolute: 1 10*3/uL (ref 0.1–1.0)
Monocytes Relative: 11 %
NEUTROS ABS: 6.5 10*3/uL (ref 1.7–7.7)
NEUTROS PCT: 73 %
Platelets: 153 10*3/uL (ref 150–400)
RBC: 4.54 MIL/uL (ref 4.22–5.81)
RDW: 13.1 % (ref 11.5–15.5)
WBC: 8.9 10*3/uL (ref 4.0–10.5)

## 2017-10-06 LAB — BASIC METABOLIC PANEL
Anion gap: 11 (ref 5–15)
BUN: 78 mg/dL — AB (ref 6–20)
CHLORIDE: 115 mmol/L — AB (ref 101–111)
CO2: 24 mmol/L (ref 22–32)
Calcium: 9.8 mg/dL (ref 8.9–10.3)
Creatinine, Ser: 1.37 mg/dL — ABNORMAL HIGH (ref 0.61–1.24)
GFR calc non Af Amer: 58 mL/min — ABNORMAL LOW (ref >60)
Glucose, Bld: 242 mg/dL — ABNORMAL HIGH (ref 65–99)
POTASSIUM: 4.5 mmol/L (ref 3.5–5.1)
SODIUM: 150 mmol/L — AB (ref 135–145)

## 2017-10-06 LAB — CULTURE, BLOOD (ROUTINE X 2)
CULTURE: NO GROWTH
Culture: NO GROWTH
SPECIAL REQUESTS: ADEQUATE
SPECIAL REQUESTS: ADEQUATE

## 2017-10-06 LAB — GLUCOSE, CAPILLARY
GLUCOSE-CAPILLARY: 274 mg/dL — AB (ref 65–99)
GLUCOSE-CAPILLARY: 299 mg/dL — AB (ref 65–99)
GLUCOSE-CAPILLARY: 328 mg/dL — AB (ref 65–99)
Glucose-Capillary: 325 mg/dL — ABNORMAL HIGH (ref 65–99)
Glucose-Capillary: 237 mg/dL — ABNORMAL HIGH (ref 65–99)
Glucose-Capillary: 174 mg/dL — ABNORMAL HIGH (ref 65–99)

## 2017-10-06 LAB — MAGNESIUM: MAGNESIUM: 3.1 mg/dL — AB (ref 1.7–2.4)

## 2017-10-06 LAB — HEPARIN LEVEL (UNFRACTIONATED): Heparin Unfractionated: 1.16 IU/mL — ABNORMAL HIGH (ref 0.30–0.70)

## 2017-10-06 LAB — PHOSPHORUS: PHOSPHORUS: 3 mg/dL (ref 2.5–4.6)

## 2017-10-06 LAB — APTT: aPTT: 47 seconds — ABNORMAL HIGH (ref 24–36)

## 2017-10-06 MED ORDER — POTASSIUM CHLORIDE 20 MEQ/15ML (10%) PO SOLN
40.0000 meq | Freq: Three times a day (TID) | ORAL | Status: AC
  Administered 2017-10-06 (×2): 40 meq
  Filled 2017-10-06 (×2): qty 30

## 2017-10-06 MED ORDER — HYDRALAZINE HCL 20 MG/ML IJ SOLN
20.0000 mg | INTRAMUSCULAR | Status: DC | PRN
  Administered 2017-10-06 – 2017-10-28 (×9): 20 mg via INTRAVENOUS
  Filled 2017-10-06 (×11): qty 1

## 2017-10-06 MED ORDER — INSULIN GLARGINE 100 UNIT/ML ~~LOC~~ SOLN
30.0000 [IU] | Freq: Two times a day (BID) | SUBCUTANEOUS | Status: DC
  Administered 2017-10-06 (×3): 30 [IU] via SUBCUTANEOUS
  Filled 2017-10-06 (×4): qty 0.3

## 2017-10-06 MED ORDER — FLEET ENEMA 7-19 GM/118ML RE ENEM
1.0000 | ENEMA | Freq: Once | RECTAL | Status: AC
  Administered 2017-10-06: 1 via RECTAL
  Filled 2017-10-06: qty 1

## 2017-10-06 MED ORDER — FREE WATER
300.0000 mL | Freq: Four times a day (QID) | Status: DC
  Administered 2017-10-06 – 2017-10-07 (×4): 300 mL

## 2017-10-06 MED ORDER — HEPARIN (PORCINE) IN NACL 100-0.45 UNIT/ML-% IJ SOLN
1950.0000 [IU]/h | INTRAMUSCULAR | Status: DC
  Administered 2017-10-06: 1700 [IU]/h via INTRAVENOUS
  Filled 2017-10-06 (×4): qty 250

## 2017-10-06 MED ORDER — CLONIDINE HCL 0.1 MG PO TABS
0.1000 mg | ORAL_TABLET | Freq: Four times a day (QID) | ORAL | Status: DC
  Administered 2017-10-06 (×3): 0.1 mg via ORAL
  Filled 2017-10-06 (×12): qty 1

## 2017-10-06 MED ORDER — DILTIAZEM 12 MG/ML ORAL SUSPENSION
30.0000 mg | Freq: Four times a day (QID) | ORAL | Status: DC
  Administered 2017-10-06 – 2017-10-14 (×29): 30 mg
  Filled 2017-10-06 (×34): qty 3

## 2017-10-06 MED ORDER — ORAL CARE MOUTH RINSE
15.0000 mL | OROMUCOSAL | Status: DC
  Administered 2017-10-06 – 2017-10-18 (×115): 15 mL via OROMUCOSAL

## 2017-10-06 MED ORDER — SODIUM CHLORIDE 0.9 % IV SOLN
0.0000 mg/h | INTRAVENOUS | Status: DC
  Administered 2017-10-06 – 2017-10-07 (×2): 4 mg/h via INTRAVENOUS
  Filled 2017-10-06 (×2): qty 10

## 2017-10-06 MED ORDER — FUROSEMIDE 10 MG/ML IJ SOLN
40.0000 mg | Freq: Three times a day (TID) | INTRAMUSCULAR | Status: AC
  Administered 2017-10-06 (×2): 40 mg via INTRAVENOUS
  Filled 2017-10-06 (×2): qty 4

## 2017-10-06 NOTE — Progress Notes (Addendum)
PULMONARY / CRITICAL CARE MEDICINE   Name: Chris RiversJerome A Wolven Jr. MRN: [id number] DOB: 11/01/1965    ADMISSION DATE:  10/01/2017 CONSULTATION DATE:  2/21  REFERRING MD:  Chris [name]  CHIEF COMPLAINT:  SOB  HISTORY OF PRESENT ILLNESS:   Chris FavorsJerome A Feldhaus [name] is a 52 y.o. male with medical history significant for HIV disease on Biktarvy, atrial fibrillation on Eliquis, COPD on prn nasal cannula oxygen, and diabetes on oral agent.  Patient is also obese.  He reports that for 3 days he has had rhinorrhea, generalized aching, cough with productive white sputum.  He has utilized his home MDIs without relief.  He forgot to use his nebulizers.  He has not had any nausea, vomiting or diarrhea but has not had much appetite.  He felt hot but stated he did not take his temperature.  He also increased his oxygen to 3 L/min from a baseline of 2 L but did not have any improvement in his difficulty breathing.  After arrival to the ER he has required stairstep increases in his oxygen due to persistent hypoxemia and currently is on high flow nasal cannula at 15 L/min.  He is still tachypneic and tachycardic.  He was found to be febrile with an oral temperature of 101.9 F.  Influenza PCR pending.  Chest x-ray without focal infiltrate.  He has been treated presumptively as COPD exacerbation with possible bronchitis and has been given a dose of oral prednisone.  Levaquin was also ordered but given patient's recent hospitalization/discharge on 07/28/17 I have opted to utilize broad-spectrum empiric antibiotic coverage.  Patient's last CD4 count was 430 on 2/15 with a viral load of 22. Because of increased work of breathing and persistent tachycardia/tachypnea patient was placed on bipap and admitted to ICU, intubated shortly after.  PAST MEDICAL HISTORY :  He  has a past medical history of Atrial fibrillation (HCC), COPD (chronic obstructive pulmonary disease) (HCC), Depression, Diabetes mellitus without complication (HCC), GERD  (gastroesophageal reflux disease), and HIV disease (HCC).  PAST SURGICAL HISTORY: He  has a past surgical history that includes Irrigation and debridement abscess (Right, 04/10/2013); Exploratory laparotomy; Incision and drainage abscess (Right, 02/20/2015); knee FRACTURE SURGERY  (Right); arm surgery (Left); Esophagogastroduodenoscopy (egd) with propofol (N/A, 09/03/2016); and Colonoscopy with propofol (N/A, 09/03/2016).  Allergies  Allergen Reactions  . Bactrim [Sulfamethoxazole-Trimethoprim] Hives    No current facility-administered medications on file prior to encounter.    Current Outpatient Medications on File Prior to Encounter  Medication Sig  . acetaminophen-codeine (TYLENOL #3) 300-30 MG tablet Take 1 tablet by mouth every 8 (eight) hours as needed for moderate pain.  [name]. albuterol (PROVENTIL HFA;VENTOLIN HFA) 108 (90 Base) MCG/ACT inhaler Inhale 2 puffs into the lungs every 4 (four) hours as needed for wheezing or shortness of breath (((PLAN B))).  [name]. apixaban (ELIQUIS) 5 MG TABS tablet Take 1 tablet (5 mg total) by mouth 2 (two) times daily.  . bictegravir-emtricitabine-tenofovir AF (BIKTARVY) 50-200-25 MG TABS tablet Take 1 tablet by mouth daily. (Patient taking differently: Take 1 tablet by mouth daily at 3 pm. )  . clotrimazole-betamethasone (LOTRISONE) cream Apply 1 application topically 2 (two) times daily.  [name]. diltiazem (CARDIZEM CD) 360 MG 24 hr capsule Take 1 capsule (360 mg total) by mouth daily.  . furosemide (LASIX) 20 MG tablet Take 1 tablet (20 mg total) by mouth daily.  . metFORMIN (GLUCOPHAGE) 1000 MG tablet Take 1 tablet (1,000 mg total) by mouth 2 (two) times daily with a meal.  .  SPIRIVA RESPIMAT 2.5 MCG/ACT AERS INHALE 2 PUFFS INTO THE LUNGS DAILY  . terbinafine (LAMISIL) 250 MG tablet Take 1 tablet (250 mg total) by mouth daily.  . Testosterone 12.5 MG/ACT (1%) GEL Place 1 Pump onto the skin daily.  [name] ACCU-CHEK SOFTCLIX LANCETS lancets Use as instructed (Patient not  taking: Reported on 10/01/2017)  . atorvastatin (LIPITOR) 20 MG tablet Take 1 tablet (20 mg total) by mouth daily.  . budesonide-formoterol (SYMBICORT) 80-4.5 MCG/ACT inhaler Take 2 puffs first thing in am and then another 2 puffs about 12 hours later.  . clotrimazole (LOTRIMIN) 1 % cream Apply 1 application topically 2 (two) times daily.  . clotrimazole (MYCELEX) 10 MG troche Take 1 tablet (10 mg total) by mouth 5 (five) times daily.  [name] glucose blood test strip Use as instructed (Patient not taking: Reported on 10/01/2017)  . ipratropium-albuterol (DUONEB) 0.5-2.5 (3) MG/3ML SOLN Take 3 mLs by nebulization every 6 (six) hours as needed.  . OXYGEN Place 2 L into the nose as needed (shortness of breath).   . pantoprazole (PROTONIX) 40 MG tablet Take 1 tablet (40 mg total) by mouth 2 (two) times daily before a meal. (Patient taking differently: Take 40 mg by mouth 3 times/day as needed-between meals & bedtime. )  . sildenafil (VIAGRA) 50 MG tablet Take 0.5 tablets (25 mg total) by mouth daily as needed for erectile dysfunction.  . urea (CARMOL) 20 % cream Apply as needed topically.    FAMILY HISTORY:  His indicated that his mother is deceased. He indicated that his father is deceased. He indicated that the status of his maternal grandmother is unknown. He indicated that his maternal grandfather is alive.   SOCIAL HISTORY: He  reports that he has been smoking cigarettes.  He has a 4.00 pack-year smoking history. he has never used smokeless tobacco. He reports that he does not drink alcohol or use drugs.  REVIEW OF SYSTEMS:   Unable to obtain  SUBJECTIVE:  Febrile overnight. UOP 4L yesterday, negative 5.3L 2/25, overall net positive 1.24L. He is constipated despite several days of bowel regimen.  VITAL SIGNS: BP (!) 159/91   Pulse 86   Temp 98.8 F (37.1 C)   Resp (!) 26   Ht 5\' 9"  (1.753 m)   Wt 267 lb 10.2 oz (121.4 kg)   SpO2 94%   BMI 39.52 kg/m   HEMODYNAMICS:    VENTILATOR  SETTINGS: Vent Mode: PRVC FiO2 (%):  [90 %] 90 % Set Rate:  [22 bmp-26 bmp] 26 bmp Vt Set:  [600 mL] 600 mL PEEP:  [10 cmH20] 10 cmH20 Plateau Pressure:  [24 cmH20-26 cmH20] 25 cmH20  INTAKE / OUTPUT: I/O last 3 completed shifts: In: 5460.1 [I.V.:3870.1; NG/GT:840; IV Piggyback:750] Out: 16109 [Urine:10685]  PHYSICAL EXAMINATION: General:  Obese man, laying in bed, sedated, intubated Neuro:  Sedated. PERRL HEENT:  ETT, OGT in place Cardiovascular:  RRR, no MRG Lungs:  Expiratory wheezing, decreased breath sounds bilaterally  Abdomen:  Obese, soft, distended, hypoactive bowel sounds Musculoskeletal:  SCD's in place. Warm. No edema Skin:  In tact  LABS:  BMET Recent Labs  Lab 10/05/17 2010 10/05/17 2249 10/06/17 0521  NA 145 145 150*  K 6.0* 5.1 4.5  CL 110 110 115*  CO2 25 26 24   BUN 77* 78* 78*  CREATININE 1.57* 1.62* 1.37*  GLUCOSE 331* 353* 242*   Electrolytes Recent Labs  Lab 10/04/17 0226 10/05/17 0314 10/05/17 2010 10/05/17 2249 10/06/17 0521  CALCIUM 8.6* 9.2 9.5  9.5 9.8  MG 2.9* 3.1*  --   --  3.1*  PHOS 3.4 3.8  --   --  3.0   CBC Recent Labs  Lab 10/04/17 0226 10/05/17 0314 10/06/17 0521  WBC 11.7* 8.3 8.9  HGB 11.8* 11.7* 13.1  HCT 39.3 39.5 42.9  PLT 172 178 153   Coag's Recent Labs  Lab 10/01/17 2023  APTT 35  INR 1.13    Sepsis Markers Recent Labs  Lab 10/01/17 1515 10/01/17 1748  LATICACIDVEN 1.82  --   PROCALCITON  --  0.12    ABG Recent Labs  Lab 10/04/17 0337 10/05/17 0418 10/06/17 0408  PHART 7.305* 7.238* 7.402  PCO2ART 49.4* 64.1* 44.1  PO2ART 64.0* 90.8 99.5    Liver Enzymes Recent Labs  Lab 10/02/17 0327  AST 23  ALT 18  ALKPHOS 44  BILITOT 0.8  ALBUMIN 3.4*    Cardiac Enzymes Recent Labs  Lab 10/01/17 2023  TROPONINI <0.03    Glucose Recent Labs  Lab 10/05/17 0802 10/05/17 1115 10/05/17 1545 10/05/17 2015 10/05/17 2354 10/06/17 0343  GLUCAP 242* 267* 289* 325* 334* 237*     Imaging Dg Chest Port 1 View  Result Date: 10/05/2017 CLINICAL DATA:  Hypoxia EXAM: PORTABLE CHEST 1 VIEW COMPARISON:  October 04, 2017 FINDINGS: Endotracheal tube tip is 5.5 cm above the carina. Nasogastric tube tip and side port are below the diaphragm. No pneumothorax. There is airspace consolidation in the left lower lobe. There is a small right pleural effusion with right base atelectasis. Lungs elsewhere clear. Heart is mildly enlarged with pulmonary vascularity within normal limits. No adenopathy. No bone lesions. IMPRESSION: Tube positions as described without pneumothorax. Consolidation consistent with a degree of pneumonia left base. Small right pleural effusion with right base atelectasis. Stable cardiac prominence. Electronically Signed   By: Bretta BangWilliam  Woodruff III M.D.   On: 10/05/2017 08:02    STUDIES:  CXR 2/21 >> no frank edema or consolidation CXR 2/22>> Endotracheal tube above the carina. Enteric tube extends into the left upper abdomen with tip and side-port likely in the proximal to mid stomach. Cardiomegaly with bibasilar atelectatic changes. CXR 2/23 >>Increased atelectasis at the right lung base CXR 2/24>> Persistent patchy basilar infiltrates R>L TTE 2/25 > - Left ventricle: The cavity size was normal. Wall thickness was   normal. Systolic function was normal. The estimated ejection   fraction was in the range of 60% to 65%. - Mitral valve: There was mild regurgitation.   CULTURES: RVP >Flu A positive Tracheal aspirate 2/22 > rare G+ cocci and rods- reincubated > NRF Blood cx 2/21 > NGx4 days  ANTIBIOTICS: Vancomycin 2/21 >>2/22 Cefepime 2/21 >> Tamiflu 2/21 >> Fluconazole 2/21 >>  SIGNIFICANT EVENTS: Admitted 2/21, intubated  LINES/TUBES: ETT 2/22 >>  DISCUSSION: 51yom with COPD on PRN oxygen presented with ~2 day h/o SOB. Now with marked respiratory distress, remains SOB on BiPAP. Intubated. Influenza positive, occult pneumonia; superimposed on  COPD exacerbation. Tamiflu, broad-spectrum abx, steroids, nebs.  ASSESSMENT / PLAN:  PULMONARY A: Acute on chronic respiratory failure w/ hypoxia COPD P:   Wean vent support as able Continue Steroids- solumedrol 60 mg bid Continue Xopenex and atrovent nebs Continue Brovana, pulmicort nebs Continue antibiotics for HCAP  CARDIOVASCULAR A:  History atrial fibrillation HTN P:  Continue Metoprolol Continue Eliquis Monitor on telemetry  RENAL A:   AKI- FeNa <1% - improving Hyperkalemia-resolved hypernatremia P:   Monitor BMP Hold lasix today due to hypernatremia Stop IVMF  GASTROINTESTINAL  A:   Obesity SUP Constipation P:   IV PPI Tube feeds at goal Bowel regimen Obtain KUB today due to abdominal distention, no BM since admission  HEMATOLOGIC A:   On Eliquis for afib P:  Monitor CBC  INFECTIOUS A:   HIV last CD4 >400, viral load 22.  Flu positive.  esophageal candidiasis noted during intubation. P:   Continue Cefepime Continue Tivicay, Descovay Continue Tamiflu Continue Diflucan Follow up on blood and aspirate cultures   ENDOCRINE A:   Type 2 DM  - Last A1C 10.5 P:   Continue lantus 30 u BID, titrate as needed Resistant SSI CBG q4   NEUROLOGIC  A:   Sedated for intubation P:   RASS goal: 0, -1 Continue sedation w/ propofol and fentanyl Versed prn  FAMILY  - Updates: no family at bedside 2/26 - Inter-disciplinary family meet or Palliative Care meeting due by: 3/1  Dolores Patty, DO PGY-2, [facility] 10/06/2017 7:35 AM  Attending Note:  52 year old male with HIV that is in respiratory failure due COPD and flu, active smoking.  Severe ARDS.  On exam, decreased BS in all lung fields.  I reviewed CXR myself, ETT is ok.  PE remains a possibility but renal function prohibits CTA.  Will change to heparin drip from eliquis.  Check procalcitonin, if elevated will add vanc.  Pan culture.  Increase PEEP to 16.  F/U ABG as needed.   Titrate O2 for sat of 88-92%.  T waves elevated on trace, will check EKG.  Lasix 40 mg IV q8 x2 doses and replace K.  PCCM will continue to follow.  The patient is critically ill with multiple organ systems failure and requires high complexity decision making for assessment and support, frequent evaluation and titration of therapies, application of advanced monitoring technologies and extensive interpretation of multiple databases.   Critical Care Time devoted to patient care services described in this note is  35  Minutes. This time reflects time of care of this signee Dr Koren Bound. This critical care time does not reflect procedure time, or teaching time or supervisory time of PA/NP/Med student/Med Resident etc but could involve care discussion time.  Alyson Reedy, M.D. [facility] Pulmonary/Critical Care Medicine. Pager: [phone]. After hours pager: [phone].

## 2017-10-06 NOTE — Progress Notes (Signed)
eLink Physician-Brief Progress Note Patient Name: Chris [name]. DOB: 01/20/1966 MRN: [id number]   Date of Service  10/06/2017  HPI/Events of Note  ABG on 80%/PRVC 30/TV 600/P 16 = 7.32/54/109/27.6. Patient reported by being uncomfortable and "Guppy Breathing" on ventilator. Presently on a Fentanyl IV infusion at 400 meg/hour and Precedex IV infusion at 2 mcg/kg/hour. Triglycerides = 230, therefore, not a candidate for Propofol.   eICU Interventions  Will order: 1. Versed IV infusion. Titrate to RASS = 0 to -1.      Intervention Category Major Interventions: Respiratory failure - evaluation and management  Sommer,Steven Eugene 10/06/2017, 8:59 PM

## 2017-10-06 NOTE — Progress Notes (Signed)
eLink Physician-Brief Progress Note Patient Name: Chris RiversJerome A Blucher Jr. DOB: 11/04/1965 MRN: [id number]   Date of Service  10/06/2017  HPI/Events of Note  Hyperglycemia - Blood glucose = 335 - Already on Lantus 45 units Q day and Q 4 hour resistant Novolog SSI.   eICU Interventions  Will order: 1. Increase Lantus dose to 30 units now and BID.     Intervention Category Major Interventions: Hyperglycemia - active titration of insulin therapy  Elain Wixon Dennard Nipugene 10/06/2017, 12:34 AM

## 2017-10-06 NOTE — Procedures (Signed)
Sputum sample obtained and sent to lab.  

## 2017-10-06 NOTE — Progress Notes (Signed)
ANTICOAGULATION CONSULT NOTE - Initial Consult  Pharmacy Consult for heparin Indication: Concern for pulmonary embolus /hx of afib   Allergies  Allergen Reactions  . Bactrim [Sulfamethoxazole-Trimethoprim] Hives    Patient Measurements: Height: 5\' 9"  (175.3 cm) Weight: 267 lb 10.2 oz (121.4 kg) IBW/kg (Calculated) : 70.7 Heparin Dosing Weight: 97.7 kg   Vital Signs: Temp: 99.4 F (37.4 C) (02/26 0800) Temp Source: Core (02/26 0800) BP: 143/92 (02/26 0815) Pulse Rate: 89 (02/26 0815)  Labs: Recent Labs    10/04/17 0226 10/05/17 0314 10/05/17 2010 10/05/17 2249 10/06/17 0521  HGB 11.8* 11.7*  --   --  13.1  HCT 39.3 39.5  --   --  42.9  PLT 172 178  --   --  153  CREATININE 2.08* 1.64* 1.57* 1.62* 1.37*    Estimated Creatinine Clearance: 82.1 mL/min (A) (by C-G formula based on SCr of 1.37 mg/dL (H)).  Assessment: 52 yo male with a history of atrial fibrillation on Eliquis at home. There is a new concern that patient may have a pulmonary embolus so we are transitioning to heparin. Last dose of apixaban was given 2/25 at 2100. CBC is currently stable and no signs of bleeding noted.   Goal of Therapy:  Heparin level 0.3-0.7 units/ml Monitor platelets by anticoagulation protocol: Yes   Plan:  Initiate heparin at 1700 units/hr  No bolus with recent Eliquis 6 hour heparin level and aPTT Daily heparin level and aPTT   Sharin MonsEmily Jasdeep Dejarnett, PharmD, BCPS PGY2 Infectious Diseases Pharmacy Resident Pager: [phone]  10/06/2017,10:44 AM

## 2017-10-06 NOTE — Progress Notes (Signed)
ANTICOAGULATION CONSULT NOTE - Initial Consult  Pharmacy Consult for heparin Indication: Concern for pulmonary embolus /hx of afib   Allergies  Allergen Reactions  . Bactrim [Sulfamethoxazole-Trimethoprim] Hives    Patient Measurements: Height: 5\' 9"  (175.3 cm) Weight: 267 lb 10.2 oz (121.4 kg) IBW/kg (Calculated) : 70.7 Heparin Dosing Weight: 97.7 kg   Vital Signs: Temp: 100.8 F (38.2 C) (02/26 2200) Temp Source: Core (02/26 2000) BP: 117/76 (02/26 2200) Pulse Rate: 98 (02/26 2200)  Labs: Recent Labs    10/04/17 0226 10/05/17 0314 10/05/17 2010 10/05/17 2249 10/06/17 0521 10/06/17 2001  HGB 11.8* 11.7*  --   --  13.1  --   HCT 39.3 39.5  --   --  42.9  --   PLT 172 178  --   --  153  --   APTT  --   --   --   --   --  47*  HEPARINUNFRC  --   --   --   --   --  1.16*  CREATININE 2.08* 1.64* 1.57* 1.62* 1.37*  --     Estimated Creatinine Clearance: 82.1 mL/min (A) (by C-G formula based on SCr of 1.37 mg/dL (H)).  Assessment: 52 yo male with a history of atrial fibrillation on Eliquis at home and here during hospital stay. There is a new concern that patient may have a pulmonary embolus so we are transitioning to heparin. Last dose of apixaban was given 2/25 at 2100. CBC is currently stable and no signs of bleeding noted.  Heparin drip started 1700 uts/hr aPTT 47sec lower than goal, HL 1.1 - falsely elevated from apixaban  - will use aptt to dose heparin drip for now.    Goal of Therapy:  aptt 66-102 sec Heparin level 0.3-0.7 units/ml Monitor platelets by anticoagulation protocol: Yes   Plan:  Increase  heparin at 1950 units/hr  Daily heparin level and aPTT   Leota SauersLisa Yamile Roedl Pharm.D. CPP, BCPS Clinical Pharmacist [phone] 10/06/2017 10:21 PM

## 2017-10-07 ENCOUNTER — Inpatient Hospital Stay (HOSPITAL_COMMUNITY): Payer: Medicare HMO

## 2017-10-07 DIAGNOSIS — E870 Hyperosmolality and hypernatremia: Secondary | ICD-10-CM

## 2017-10-07 DIAGNOSIS — E119 Type 2 diabetes mellitus without complications: Secondary | ICD-10-CM

## 2017-10-07 DIAGNOSIS — J1001 Influenza due to other identified influenza virus with the same other identified influenza virus pneumonia: Secondary | ICD-10-CM

## 2017-10-07 DIAGNOSIS — J449 Chronic obstructive pulmonary disease, unspecified: Secondary | ICD-10-CM

## 2017-10-07 DIAGNOSIS — Z833 Family history of diabetes mellitus: Secondary | ICD-10-CM

## 2017-10-07 DIAGNOSIS — Z836 Family history of other diseases of the respiratory system: Secondary | ICD-10-CM

## 2017-10-07 DIAGNOSIS — Z9911 Dependence on respirator [ventilator] status: Secondary | ICD-10-CM

## 2017-10-07 DIAGNOSIS — R509 Fever, unspecified: Secondary | ICD-10-CM

## 2017-10-07 DIAGNOSIS — Z9981 Dependence on supplemental oxygen: Secondary | ICD-10-CM

## 2017-10-07 DIAGNOSIS — Z8249 Family history of ischemic heart disease and other diseases of the circulatory system: Secondary | ICD-10-CM

## 2017-10-07 DIAGNOSIS — Z825 Family history of asthma and other chronic lower respiratory diseases: Secondary | ICD-10-CM

## 2017-10-07 DIAGNOSIS — J9621 Acute and chronic respiratory failure with hypoxia: Secondary | ICD-10-CM

## 2017-10-07 DIAGNOSIS — B3781 Candidal esophagitis: Secondary | ICD-10-CM

## 2017-10-07 DIAGNOSIS — E669 Obesity, unspecified: Secondary | ICD-10-CM

## 2017-10-07 DIAGNOSIS — K5900 Constipation, unspecified: Secondary | ICD-10-CM

## 2017-10-07 DIAGNOSIS — F17210 Nicotine dependence, cigarettes, uncomplicated: Secondary | ICD-10-CM

## 2017-10-07 DIAGNOSIS — E1169 Type 2 diabetes mellitus with other specified complication: Secondary | ICD-10-CM

## 2017-10-07 DIAGNOSIS — Z800 Family history of malignant neoplasm of digestive organs: Secondary | ICD-10-CM

## 2017-10-07 DIAGNOSIS — Z882 Allergy status to sulfonamides status: Secondary | ICD-10-CM

## 2017-10-07 LAB — GLUCOSE, CAPILLARY
GLUCOSE-CAPILLARY: 185 mg/dL — AB (ref 65–99)
GLUCOSE-CAPILLARY: 371 mg/dL — AB (ref 65–99)
GLUCOSE-CAPILLARY: 344 mg/dL — AB (ref 65–99)
GLUCOSE-CAPILLARY: 255 mg/dL — AB (ref 65–99)
Glucose-Capillary: 346 mg/dL — ABNORMAL HIGH (ref 65–99)
Glucose-Capillary: 163 mg/dL — ABNORMAL HIGH (ref 65–99)
Glucose-Capillary: 121 mg/dL — ABNORMAL HIGH (ref 65–99)

## 2017-10-07 LAB — BLOOD GAS, ARTERIAL
Acid-base deficit: 1.4 mmol/L (ref 0.0–2.0)
Bicarbonate: 24.5 mmol/L (ref 20.0–28.0)
DRAWN BY: 44135
FIO2: 80
MECHVT: 600 mL
O2 Saturation: 98.7 %
PATIENT TEMPERATURE: 97.9
PCO2 ART: 53.1 mmHg — AB (ref 32.0–48.0)
PEEP: 16 cmH2O
PH ART: 7.284 — AB (ref 7.350–7.450)
PO2 ART: 145 mmHg — AB (ref 83.0–108.0)
RATE: 30 resp/min

## 2017-10-07 LAB — BASIC METABOLIC PANEL
Anion gap: 10 (ref 5–15)
Anion gap: 9 (ref 5–15)
BUN: 92 mg/dL — AB (ref 6–20)
BUN: 113 mg/dL — AB (ref 6–20)
CALCIUM: 9.7 mg/dL (ref 8.9–10.3)
CALCIUM: 9.2 mg/dL (ref 8.9–10.3)
CO2: 23 mmol/L (ref 22–32)
CO2: 24 mmol/L (ref 22–32)
CREATININE: 1.51 mg/dL — AB (ref 0.61–1.24)
CREATININE: 1.65 mg/dL — AB (ref 0.61–1.24)
Chloride: 117 mmol/L — ABNORMAL HIGH (ref 101–111)
Chloride: 121 mmol/L — ABNORMAL HIGH (ref 101–111)
GFR calc non Af Amer: 52 mL/min — ABNORMAL LOW (ref >60)
GFR, EST AFRICAN AMERICAN: 54 mL/min — AB (ref >60)
GFR, EST NON AFRICAN AMERICAN: 47 mL/min — AB (ref >60)
Glucose, Bld: 420 mg/dL — ABNORMAL HIGH (ref 65–99)
Glucose, Bld: 150 mg/dL — ABNORMAL HIGH (ref 65–99)
Potassium: 5.9 mmol/L — ABNORMAL HIGH (ref 3.5–5.1)
Potassium: 4.3 mmol/L (ref 3.5–5.1)
SODIUM: 154 mmol/L — AB (ref 135–145)
Sodium: 150 mmol/L — ABNORMAL HIGH (ref 135–145)

## 2017-10-07 LAB — CBC WITH DIFFERENTIAL/PLATELET
BASOS PCT: 0 %
Basophils Absolute: 0 10*3/uL (ref 0.0–0.1)
EOS ABS: 0 10*3/uL (ref 0.0–0.7)
Eosinophils Relative: 0 %
HCT: 44.5 % (ref 39.0–52.0)
HEMOGLOBIN: 12.6 g/dL — AB (ref 13.0–17.0)
Lymphocytes Relative: 13 %
Lymphs Abs: 0.9 10*3/uL (ref 0.7–4.0)
MCH: 28 pg (ref 26.0–34.0)
MCHC: 28.3 g/dL — AB (ref 30.0–36.0)
MCV: 98.9 fL (ref 78.0–100.0)
MONOS PCT: 5 %
Monocytes Absolute: 0.3 10*3/uL (ref 0.1–1.0)
NEUTROS PCT: 82 %
Neutro Abs: 5.5 10*3/uL (ref 1.7–7.7)
Platelets: 142 10*3/uL — ABNORMAL LOW (ref 150–400)
RBC: 4.5 MIL/uL (ref 4.22–5.81)
RDW: 13.7 % (ref 11.5–15.5)
WBC: 6.7 10*3/uL (ref 4.0–10.5)

## 2017-10-07 LAB — HEPARIN LEVEL (UNFRACTIONATED): Heparin Unfractionated: 1.08 IU/mL — ABNORMAL HIGH (ref 0.30–0.70)

## 2017-10-07 LAB — APTT
aPTT: 72 seconds — ABNORMAL HIGH (ref 24–36)
aPTT: >200 seconds (ref 24–36)

## 2017-10-07 LAB — URINE CULTURE: CULTURE: NO GROWTH

## 2017-10-07 LAB — MAGNESIUM: MAGNESIUM: 3 mg/dL — AB (ref 1.7–2.4)

## 2017-10-07 LAB — TRIGLYCERIDES: Triglycerides: 234 mg/dL — ABNORMAL HIGH (ref <150)

## 2017-10-07 LAB — PHOSPHORUS: Phosphorus: 6.4 mg/dL — ABNORMAL HIGH (ref 2.5–4.6)

## 2017-10-07 LAB — PROCALCITONIN: Procalcitonin: 0.24 ng/mL

## 2017-10-07 MED ORDER — CLONAZEPAM 1 MG PO TABS
1.0000 mg | ORAL_TABLET | Freq: Two times a day (BID) | ORAL | Status: DC
  Administered 2017-10-07 – 2017-10-13 (×12): 1 mg
  Filled 2017-10-07 (×12): qty 1

## 2017-10-07 MED ORDER — AMIODARONE HCL IN DEXTROSE 360-4.14 MG/200ML-% IV SOLN
INTRAVENOUS | Status: AC
  Administered 2017-10-07: 60 mg/h
  Filled 2017-10-07: qty 200

## 2017-10-07 MED ORDER — AMIODARONE HCL IN DEXTROSE 360-4.14 MG/200ML-% IV SOLN
60.0000 mg/h | INTRAVENOUS | Status: AC
  Administered 2017-10-07: 60 mg/h via INTRAVENOUS
  Filled 2017-10-07: qty 200

## 2017-10-07 MED ORDER — AZITHROMYCIN 500 MG IV SOLR
500.0000 mg | INTRAVENOUS | Status: DC
  Administered 2017-10-07 – 2017-10-08 (×2): 500 mg via INTRAVENOUS
  Filled 2017-10-07 (×3): qty 500

## 2017-10-07 MED ORDER — SORBITOL 70 % SOLN
960.0000 mL | TOPICAL_OIL | Freq: Once | ORAL | Status: AC
  Administered 2017-10-07: 960 mL via RECTAL
  Filled 2017-10-07: qty 473

## 2017-10-07 MED ORDER — PEG 3350-KCL-NA BICARB-NACL 420 G PO SOLR
1000.0000 mL | Freq: Once | ORAL | Status: AC
  Administered 2017-10-07: 1000 mL
  Filled 2017-10-07 (×2): qty 4000

## 2017-10-07 MED ORDER — LIDOCAINE HCL (PF) 1 % IJ SOLN
INTRAMUSCULAR | Status: AC
  Filled 2017-10-07: qty 5

## 2017-10-07 MED ORDER — FUROSEMIDE 10 MG/ML IJ SOLN
40.0000 mg | Freq: Once | INTRAMUSCULAR | Status: AC
  Administered 2017-10-07: 40 mg via INTRAVENOUS
  Filled 2017-10-07: qty 4

## 2017-10-07 MED ORDER — FAMOTIDINE 40 MG/5ML PO SUSR
20.0000 mg | Freq: Two times a day (BID) | ORAL | Status: DC
Start: 2017-10-07 — End: 2017-10-10
  Administered 2017-10-07 – 2017-10-10 (×7): 20 mg
  Filled 2017-10-07 (×7): qty 2.5

## 2017-10-07 MED ORDER — AMIODARONE IV BOLUS ONLY 150 MG/100ML
150.0000 mg | Freq: Once | INTRAVENOUS | Status: AC
  Administered 2017-10-07: 150 mg via INTRAVENOUS
  Filled 2017-10-07: qty 100

## 2017-10-07 MED ORDER — METOPROLOL TARTRATE 5 MG/5ML IV SOLN
5.0000 mg | Freq: Once | INTRAVENOUS | Status: AC
  Administered 2017-10-07: 5 mg via INTRAVENOUS
  Filled 2017-10-07: qty 5

## 2017-10-07 MED ORDER — PROPOFOL 1000 MG/100ML IV EMUL
0.0000 ug/kg/min | INTRAVENOUS | Status: DC
  Administered 2017-10-07: 5 ug/kg/min via INTRAVENOUS
  Administered 2017-10-07: 25 ug/kg/min via INTRAVENOUS
  Administered 2017-10-08: 15 ug/kg/min via INTRAVENOUS
  Filled 2017-10-07 (×3): qty 100

## 2017-10-07 MED ORDER — SODIUM CHLORIDE 0.9 % IV SOLN
100.0000 mg | INTRAVENOUS | Status: AC
  Administered 2017-10-07 – 2017-10-13 (×7): 100 mg via INTRAVENOUS
  Filled 2017-10-07 (×7): qty 100

## 2017-10-07 MED ORDER — FREE WATER
300.0000 mL | Status: DC
  Administered 2017-10-07 – 2017-10-13 (×33): 300 mL

## 2017-10-07 MED ORDER — DEXTROSE 5 % IV BOLUS
1000.0000 mL | Freq: Once | INTRAVENOUS | Status: AC
  Administered 2017-10-07: 1000 mL via INTRAVENOUS

## 2017-10-07 MED ORDER — AMIODARONE HCL IN DEXTROSE 360-4.14 MG/200ML-% IV SOLN
30.0000 mg/h | INTRAVENOUS | Status: DC
  Administered 2017-10-07: 30 mg/h via INTRAVENOUS
  Administered 2017-10-08: 60 mg/h via INTRAVENOUS
  Administered 2017-10-08: 30 mg/h via INTRAVENOUS
  Administered 2017-10-08 – 2017-10-09 (×2): 60 mg/h via INTRAVENOUS
  Administered 2017-10-10 – 2017-10-11 (×3): 30 mg/h via INTRAVENOUS
  Filled 2017-10-07 (×18): qty 200

## 2017-10-07 MED ORDER — HEPARIN (PORCINE) IN NACL 100-0.45 UNIT/ML-% IJ SOLN
2650.0000 [IU]/h | INTRAMUSCULAR | Status: DC
  Administered 2017-10-07: 1600 [IU]/h via INTRAVENOUS
  Administered 2017-10-09: 2350 [IU]/h via INTRAVENOUS
  Administered 2017-10-09: 2150 [IU]/h via INTRAVENOUS
  Administered 2017-10-09 – 2017-10-10 (×2): 2450 [IU]/h via INTRAVENOUS
  Filled 2017-10-07 (×13): qty 250

## 2017-10-07 MED ORDER — INSULIN GLARGINE 100 UNIT/ML ~~LOC~~ SOLN
45.0000 [IU] | Freq: Two times a day (BID) | SUBCUTANEOUS | Status: DC
  Administered 2017-10-07 (×2): 45 [IU] via SUBCUTANEOUS
  Filled 2017-10-07 (×4): qty 0.45

## 2017-10-07 NOTE — Progress Notes (Signed)
ANTICOAGULATION CONSULT NOTE - Follow Up Consult  Pharmacy Consult for heparin Indication: Afib w/ concern for acute PE  Labs: Recent Labs    10/05/17 0314  10/05/17 2249 10/06/17 0521 10/06/17 2001 10/07/17 0318  HGB 11.7*  --   --  13.1  --  12.6*  HCT 39.5  --   --  42.9  --  44.5  PLT 178  --   --  153  --  142*  APTT  --   --   --   --  47* 72*  HEPARINUNFRC  --   --   --   --  1.16*  --   CREATININE 1.64*   < > 1.62* 1.37*  --  1.51*   < > = values in this interval not displayed.    Assessment/Plan:  52yo male therapeutic on heparin after rate change. Will continue gtt at current rate and confirm stable with additional PTT.   Vernard GamblesVeronda Lakecia Deschamps, PharmD, BCPS  10/07/2017,5:11 AM

## 2017-10-07 NOTE — Progress Notes (Signed)
Inpatient Diabetes Program Recommendations  AACE/[name]: New Consensus Statement on Inpatient Glycemic Control (2015)  Target Ranges:  Prepandial:   less than 140 mg/dL      Peak postprandial:   less than 180 mg/dL (1-2 hours)      Critically ill patients:  140 - 180 mg/dL   Lab Results  Component Value Date   GLUCAP 346 (H) 10/07/2017   HGBA1C 10.5 09/02/2017    Review of Glycemic Control Results for Chris RiversWARD, Chris A JR. (MRN [id number]) as of 10/07/2017 10:09  Ref. Range 10/06/2017 11:45 10/06/2017 15:56 10/06/2017 20:09 10/07/2017 00:02 10/07/2017 04:40 10/07/2017 07:50  Glucose-Capillary Latest Ref Range: 65 - 99 mg/dL 409185 (H) 811328 (H) 914299 (H) 274 (H) 371 (H) 346 (H)   Inpatient Diabetes Program Recommendations:   Noted CBGs consistently >200. -Please consider ICU Adult glycemic control order set  Thank you, Chris FischerJudy E. Abbas Beyene, RN, MSN, CDE  Diabetes Coordinator Inpatient Glycemic Control Team Team Pager [phone] (8am-5pm) 10/07/2017 10:10 AM

## 2017-10-07 NOTE — Procedures (Addendum)
Central Venous Catheter Insertion Procedure Note Chris RiversJerome A Manson Jr. [id number] 02/09/1966  Procedure: Insertion of Central Venous Catheter Indications: Drug and/or fluid administration  Procedure Details Consent: Risks of procedure as well as the alternatives and risks of each were explained to the (patient/caregiver).  Consent for procedure obtained. Time Out: Verified patient identification, verified procedure, site/side was marked, verified correct patient position, special equipment/implants available, medications/allergies/relevent history reviewed, required imaging and test results available.  Performed  Maximum sterile technique was used including antiseptics, cap, gloves, gown, hand hygiene, mask and sheet. Skin prep: Chlorhexidine; local anesthetic administered A antimicrobial bonded/coated triple lumen catheter was placed in the left internal jugular vein using the Seldinger technique.  Evaluation US was used for real time imaging for vessel cannulation Blood flow good Complications: No apparent complications Patient did tolerate procedure well. Chest X-ray ordered to verify placement.  CXR: normal.  Abdoulaye Diallo 10/07/2017, 5:45 PM  I was present, supervised and was scrubed in for the entire procedure.  Alyson ReedyWesam G. Yacoub, M.D. [facility] Pulmonary/Critical Care Medicine. Pager: [phone]. After hours pager: [phone].

## 2017-10-07 NOTE — Progress Notes (Addendum)
PULMONARY / CRITICAL CARE MEDICINE   Name: Chris [name]. MRN: [id number] DOB: 04/18/1966    ADMISSION DATE:  10/01/2017 CONSULTATION DATE:  2/21  REFERRING MD:  Juleen China  CHIEF COMPLAINT:  SOB  HISTORY OF PRESENT ILLNESS:   Chris [name]. is a 52 y.o. male with medical history significant for HIV disease on Biktarvy, atrial fibrillation on Eliquis, COPD on prn nasal cannula oxygen, and diabetes on oral agent.  Patient is also obese.  He reports that for 3 days he has had rhinorrhea, generalized aching, cough with productive white sputum.  He has utilized his home MDIs without relief.  He forgot to use his nebulizers.  He has not had any nausea, vomiting or diarrhea but has not had much appetite.  He felt hot but stated he did not take his temperature.  He also increased his oxygen to 3 L/min from a baseline of 2 L but did not have any improvement in his difficulty breathing.  After arrival to the ER he has required stairstep increases in his oxygen due to persistent hypoxemia and currently is on high flow nasal cannula at 15 L/min.  He is still tachypneic and tachycardic.  He was found to be febrile with an oral temperature of 101.9 F.  Influenza PCR pending.  Chest x-ray without focal infiltrate.  He has been treated presumptively as COPD exacerbation with possible bronchitis and has been given a dose of oral prednisone.  Levaquin was also ordered but given patient's recent hospitalization/discharge on 07/28/17 I have opted to utilize broad-spectrum empiric antibiotic coverage.  Patient's last CD4 count was 430 on 2/15 with a viral load of 22. Because of increased work of breathing and persistent tachycardia/tachypnea patient was placed on bipap and admitted to ICU, intubated shortly after.  PAST MEDICAL HISTORY :  He  has a past medical history of Atrial fibrillation (HCC), COPD (chronic obstructive pulmonary disease) (HCC), Depression, Diabetes mellitus without complication (HCC), GERD  (gastroesophageal reflux disease), and HIV disease (HCC).  REVIEW OF SYSTEMS:   Unable to obtain  SUBJECTIVE:  Versed started overnight due to vent dyssynchrony. Febrile yesterday evening to 102.2.   VITAL SIGNS: BP 102/72   Pulse 87   Temp 97.9 F (36.6 C)   Resp (!) 30   Ht 5\' 9"  (1.753 m)   Wt 262 lb 12.6 oz (119.2 kg)   SpO2 95%   BMI 38.81 kg/m   HEMODYNAMICS:    VENTILATOR SETTINGS: Vent Mode: PRVC FiO2 (%):  [80 %-90 %] 80 % Set Rate:  [26 bmp-30 bmp] 30 bmp Vt Set:  [600 mL] 600 mL PEEP:  [10 cmH20-16 cmH20] 16 cmH20 Plateau Pressure:  [26 cmH20-32 cmH20] 32 cmH20  INTAKE / OUTPUT: I/O last 3 completed shifts: In: 4427.9 [I.V.:2797.9; NG/GT:980; IV Piggyback:650] Out: 9000 [Urine:9000]  PHYSICAL EXAMINATION: General:  Obese man, laying in bed, sedated, intubated Neuro:  Sedated. PERRL HEENT:  ETT, OGT in place Cardiovascular:  RRR, no MRG Lungs:  Expiratory wheezing, decreased breath sounds bilaterally  Abdomen:  Obese, soft, distended, hypoactive bowel sounds Musculoskeletal:  SCD's in place. Warm. No edema Skin:  In tact  LABS:  BMET Recent Labs  Lab 10/05/17 2249 10/06/17 0521 10/07/17 0318  NA 145 150* 150*  K 5.1 4.5 5.9*  CL 110 115* 117*  CO2 26 24 23   BUN 78* 78* 92*  CREATININE 1.62* 1.37* 1.51*  GLUCOSE 353* 242* 420*   Electrolytes Recent Labs  Lab 10/05/17 0314  10/05/17 2249  10/06/17 0521 10/07/17 0318  CALCIUM 9.2   < > 9.5 9.8 9.7  MG 3.1*  --   --  3.1* 3.0*  PHOS 3.8  --   --  3.0 6.4*   < > = values in this interval not displayed.   CBC Recent Labs  Lab 10/05/17 0314 10/06/17 0521 10/07/17 0318  WBC 8.3 8.9 6.7  HGB 11.7* 13.1 12.6*  HCT 39.5 42.9 44.5  PLT 178 153 142*   Coag's Recent Labs  Lab 10/01/17 2023 10/06/17 2001 10/07/17 0318  APTT 35 47* 72*  INR 1.13  --   --     Sepsis Markers Recent Labs  Lab 10/01/17 1515 10/01/17 1748  LATICACIDVEN 1.82  --   PROCALCITON  --  0.12     ABG Recent Labs  Lab 10/06/17 0408 10/06/17 2027 10/07/17 0332  PHART 7.402 7.301* 7.284*  PCO2ART 44.1 59.6* 53.1*  PO2ART 99.5 121* 145*    Liver Enzymes Recent Labs  Lab 10/02/17 0327  AST 23  ALT 18  ALKPHOS 44  BILITOT 0.8  ALBUMIN 3.4*    Cardiac Enzymes Recent Labs  Lab 10/01/17 2023  TROPONINI <0.03    Glucose Recent Labs  Lab 10/06/17 0757 10/06/17 1145 10/06/17 1556 10/06/17 2009 10/07/17 0002 10/07/17 0440  GLUCAP 174* 185* 328* 299* 274* 371*    Imaging Dg Abd Portable 1v  Result Date: 10/06/2017 CLINICAL DATA:  Abdominal distension EXAM: PORTABLE ABDOMEN - 1 VIEW COMPARISON:  None. FINDINGS: Nasogastric tube tip and side port are in the stomach. There is a relative paucity of gas. There is no bowel dilatation or air-fluid level to indicate obstruction. No free air. There is postoperative change in the proximal right femur. IMPRESSION: Nasogastric tube tip and side port in stomach. There is a paucity of bowel gas. This finding may be seen normally but also may be indicative of ileus or enteritis. Bowel obstruction felt to be less likely. No evident free air. Electronically Signed   By: Bretta BangWilliam  Woodruff III M.D.   On: 10/06/2017 09:04    STUDIES:  CXR 2/21 >> no frank edema or consolidation CXR 2/22>> Endotracheal tube above the carina. Enteric tube extends into the left upper abdomen with tip and side-port likely in the proximal to mid stomach. Cardiomegaly with bibasilar atelectatic changes. CXR 2/23 >>Increased atelectasis at the right lung base CXR 2/24>> Persistent patchy basilar infiltrates R>L TTE 2/25 > - Left ventricle: The cavity size was normal. Wall thickness was   normal. Systolic function was normal. The estimated ejection   fraction was in the range of 60% to 65%. - Mitral valve: There was mild regurgitation. KUB 2/26 > ileus vs enteritis, less likely SBO  CULTURES: RVP >Flu A positive Tracheal aspirate 2/22 > rare G+  cocci and rods- reincubated > NRF Blood cx 2/21 > NGx5 days Trach asp 2/26 > moderate WBC, no organisms Ucx 2/26 >>  Blood cx 26 >>  ANTIBIOTICS: Vancomycin 2/21 >>2/22 Cefepime 2/21 >> Tamiflu 2/21 >> 2/26 Fluconazole 2/21 >>  SIGNIFICANT EVENTS: Admitted 2/21, intubated  LINES/TUBES: ETT 2/22 >> OGT 2/22 >> PIV Foley 2/22 >>  DISCUSSION: 51yom with COPD on PRN oxygen presented with ~2 day h/o SOB. Now with marked respiratory distress, remains SOB on BiPAP. Intubated. Influenza positive, occult pneumonia; superimposed on COPD exacerbation. Tamiflu, broad-spectrum abx, steroids, nebs.  ASSESSMENT / PLAN:  PULMONARY A: Acute on chronic respiratory failure w/ hypoxia COPD P:   Wean vent support as able D/C  solumedrol Continue Xopenex and atrovent nebs Continue Brovana, pulmicort nebs Continue cefepime for HCAP Heparin on due to concern for PE  CARDIOVASCULAR A:  History atrial fibrillation HTN P:  Continue Metoprolol Held eliquis, started heparin due to concern for PE Monitor on telemetry  RENAL A:   AKI- FeNa <1% - improving Hyperkalemia-resolved hypernatremia P:   Monitor BMP Free water per tube  GASTROINTESTINAL A:   Obesity SUP Constipation P:   IV PPI Tube feeds at goal Bowel regimen: colace, senna, dulcolax suppositories, fleet enema  HEMATOLOGIC A:   On Eliquis for afib Concern for PE P:  Monitor CBC on heparin  INFECTIOUS A:   HIV last CD4 >400, viral load 22.  Flu positive- Received course of Tamiflu esophageal candidiasis noted during intubation. P:   Continue Cefepime Continue Tivicay, Descovay Continue Diflucan for 7 days Repeat pan cultures 2/26 due to fever, follow results   ENDOCRINE A:   Type 2 DM  - Last A1C 10.5, on steroids P:   Increase lantus to 45 units BID Resistant SSI CBG q4   NEUROLOGIC  A:   Sedated for intubation P:   RASS goal: 0, -1 Continue sedation w/ propofol and fentanyl Versed  prn  FAMILY  - Updates: no family at bedside 2/27 - Inter-disciplinary family meet or Palliative Care meeting due by: 3/1  Dolores Patty, DO PGY-2, [facility] 10/07/2017 7:36 AM  Attending Note:  52 year old male with HIV that remains febrile inspite of broad spectrum anti-bacterial and anti-fungal.  On exam, coarse BS diffusely with an air leak.  I reviewed CXR myself, ETT is in good position.  Hypernatremia, hyperkalemia and hyperchloremia.  Remains in ARDS.  Respiratory acidosis.  Will increase RR to 35.  Maintain FiO2 and PEEP.  Will give a liter of D5W with lasix 40 mg IV x1.  Clonazepam 1 mg PO BID and back off versed.  Continue fentanyl drip.  Will consult ID.  D/C solumedrol.  The patient is critically ill with multiple organ systems failure and requires high complexity decision making for assessment and support, frequent evaluation and titration of therapies, application of advanced monitoring technologies and extensive interpretation of multiple databases.   Critical Care Time devoted to patient care services described in this note is  35  Minutes. This time reflects time of care of this signee Dr Koren Bound. This critical care time does not reflect procedure time, or teaching time or supervisory time of PA/NP/Med student/Med Resident etc but could involve care discussion time.  Alyson Reedy, M.D. [facility] Pulmonary/Critical Care Medicine. Pager: [phone]. After hours pager: [phone].

## 2017-10-07 NOTE — Progress Notes (Signed)
ANTICOAGULATION CONSULT NOTE - Follow-Up  Consult  Pharmacy Consult for heparin Indication: Concern for pulmonary embolus /hx of afib   Allergies  Allergen Reactions  . Bactrim [Sulfamethoxazole-Trimethoprim] Hives    Patient Measurements: Height: 5\' 9"  (175.3 cm) Weight: 262 lb 12.6 oz (119.2 kg) IBW/kg (Calculated) : 70.7 Heparin Dosing Weight: 97.7 kg   Vital Signs: Temp: 98.5 F (36.9 C) (02/27 1133) Temp Source: Rectal (02/27 1133) BP: 121/78 (02/27 1153) Pulse Rate: 105 (02/27 1153)  Labs: Recent Labs    10/05/17 0314  10/05/17 2249 10/06/17 0521 10/06/17 2001 10/07/17 0318 10/07/17 1100  HGB 11.7*  --   --  13.1  --  12.6*  --   HCT 39.5  --   --  42.9  --  44.5  --   PLT 178  --   --  153  --  142*  --   APTT  --   --   --   --  47* 72* >200*  HEPARINUNFRC  --   --   --   --  1.16* 1.08*  --   CREATININE 1.64*   < > 1.62* 1.37*  --  1.51*  --    < > = values in this interval not displayed.    Estimated Creatinine Clearance: 73.8 mL/min (A) (by C-G formula based on SCr of 1.51 mg/dL (H)).  Assessment: 52 yo male with a history of atrial fibrillation on Eliquis at home and here during hospital stay. There is a new concern that patient may have a pulmonary embolus so we are transitioning to heparin. Last dose of apixaban was given 2/25 at 2100.   ApTT is elevated this morning after a rate increase last night. Inspected patient and level appears to have been drawn appropriately from opposite arm of infusion. H/H Stable and platelets down slightly. No signs of bleeding noted.   Goal of Therapy:  aptt 66-102 sec Heparin level 0.3-0.7 units/ml Monitor platelets by anticoagulation protocol: Yes   Plan:  Hold heparin X 1 hour  Re-initiate heparin gtt at 1600 units/hr 6 hour aPTT Daily HL/aPTT  Sharin MonsEmily Sinclair, PharmD, BCPS PGY2 Infectious Diseases Pharmacy Resident Pager: [phone]  10/07/2017 12:23 PM

## 2017-10-07 NOTE — Procedures (Signed)
Central Venous Catheter Insertion Procedure Note Chris RiversJerome A Boehne [name]. [id number] 03/27/1966  Procedure: Insertion of Central Venous Catheter Indications: Assessment of intravascular volume, Drug and/or fluid administration and Frequent blood sampling  Procedure Details Consent: Risks of procedure as well as the alternatives and risks of each were explained to the (patient/caregiver).  Consent for procedure obtained. Time Out: Verified patient identification, verified procedure, site/side was marked, verified correct patient position, special equipment/implants available, medications/allergies/relevent history reviewed, required imaging and test results available.  Performed  Maximum sterile technique was used including antiseptics, cap, gloves, gown, hand hygiene, mask and sheet. Skin prep: Chlorhexidine; local anesthetic administered A antimicrobial bonded/coated triple lumen catheter was placed in the left internal jugular vein using the Seldinger technique.  Evaluation Blood flow good Complications: No apparent complications Patient did tolerate procedure well. Chest X-ray ordered to verify placement.  CXR: pending.  U/S used in placement.  YACOUB,WESAM 10/07/2017, 5:18 PM

## 2017-10-07 NOTE — Consult Note (Signed)
[facility]ious [facility]  Total days of antibiotics 6        Day 6 cefepime               Reason for Consult: fever/respiratory distress  Referring Physician: yacoub  Principal Problem:   Acute on chronic respiratory failure with hypoxia ([place]) Active Problems:   COPD exacerbation ([place])   Diabetes mellitus type 2 in obese ([place])   COPD with exacerbation ([place])   Atrial fibrillation ([place])   HIV (human immunodeficiency virus infection) ([place])   Tobacco abuse   Bronchitis    HPI: Chris [name]. is a 52 y.o. male with MMP including poorly controlled IDDM, COPD on 2LNC, HIV disease, well controlled, obesity, who was admitted on 2/21 with respiratory distress and fevers requiring intubation. Was found to have influenza A/H3N2, started on oseltamivir as well as vanco, and cefepime. Started to have fevers in the last 2 days as high as 102F. He was also started on heparin for presumed PE. Infectious work up not showing any positive cultures except positive flu test on admit. Had repeat blood cx on 2/26 which are pending, procalcitonin checked at 0.24. He was placed on fluconazole on admit due to esophageal candidiasis noted on intubation, interestingly. Hypernatremia in the last 2 days at 150. His nurse reports that he often has fever spike in early evening. No other new sources. He was distended early in admission, in addn to constipation for which he has received oral bowel regimen and now improved. His vent settings still FiO2 60%. Had left IJ placed today  Past Medical History:  Diagnosis Date  . Atrial fibrillation ([place])   . COPD (chronic obstructive pulmonary disease) ([employer])    Emphysema [[name]]  . Depression   . Diabetes mellitus without complication ([place])   . GERD (gastroesophageal reflux disease)   . HIV disease ([place])     Allergies:  Allergies  Allergen Reactions  . Bactrim [Sulfamethoxazole-Trimethoprim] Hives    MEDICATIONS: . arformoterol  15 mcg  Nebulization BID  . budesonide (PULMICORT) nebulizer solution  0.5 mg Nebulization BID  . chlorhexidine gluconate (MEDLINE KIT)  15 mL Mouth Rinse BID  . clonazePAM  1 mg Per Tube BID  . cloNIDine  0.1 mg Oral Q6H  . diltiazem  30 mg Per Tube Q6H  . dolutegravir  50 mg Oral Daily  . emtricitabine-tenofovir AF  1 tablet Per Tube Daily  . famotidine  20 mg Per Tube BID  . feeding supplement (PRO-STAT SUGAR FREE 64)  60 mL Oral TID  . feeding supplement (VITAL HIGH PROTEIN)  1,000 mL Per Tube Q24H  . free water  300 mL Per Tube Q4H  . insulin aspart  0-20 Units Subcutaneous Q4H  . insulin glargine  45 Units Subcutaneous BID  . ipratropium  0.5 mg Nebulization Q4H  . levalbuterol  0.63 mg Nebulization Q4H  . mouth rinse  15 mL Mouth Rinse 10 times per day  . senna  1 tablet Oral Daily  . sodium chloride flush  3 mL Intravenous Q12H    Social History   Tobacco Use  . Smoking status: Current Some Day Smoker    Packs/day: 0.10    Years: 40.00    Pack years: 4.00    Types: Cigarettes  . Smokeless tobacco: Never Used  . Tobacco comment: smokes 3/ day since d/c from hospital  Substance Use Topics  . Alcohol use: No    Alcohol/week: 0.0 oz  .  Drug use: No    Comment: quit 04    Family History  Problem Relation Age of Onset  . Throat cancer Father 29  . Emphysema Maternal Uncle        was a smoker  . Diabetes Maternal Uncle   . Heart disease Maternal Uncle   . COPD Maternal Uncle   . Asthma Maternal Uncle   . Diabetes Maternal Uncle   . Asthma Maternal Aunt   . Diabetes Maternal Aunt   . Heart disease Maternal Aunt   . Throat cancer Maternal Grandmother        never smoker, used snuff  . Diabetes Maternal Grandmother   . Breast cancer Maternal Aunt     Review of Systems -unable to obtain due to being intubated/sedated   OBJECTIVE: Temp:  [97.7 F (36.5 C)-102.2 F (39 C)] 97.9 F (36.6 C) (02/27 1400) Pulse Rate:  [86-112] 103 (02/27 1400) Resp:  [30] 30 (02/27  1153) BP: (97-134)/(66-89) 122/82 (02/27 1400) SpO2:  [92 %-96 %] 92 % (02/27 1400) FiO2 (%):  [70 %-80 %] 70 % (02/27 1551) Weight:  [262 lb 12.6 oz (119.2 kg)] 262 lb 12.6 oz (119.2 kg) (02/27 0500) Physical Exam  Constitutional: He is sedated/intubated. He appears well-developed and well-nourished. No distress.  HENT:  Mouth/Throat: OETT in place Cardiovascular: Normal rate, regular rhythm and normal heart sounds. Exam reveals no gallop and no friction rub.  No murmur heard.  Pulmonary/Chest: Effort normal and breath sounds normal. No respiratory distress. He has no wheezes.  Abdominal: Soft. Protuberant, soft. mild distension, decrease BS. There is no tenderness.  Skin: Skin is warm and dry. No rash noted. No erythema.  Psychiatric: sedated    LABS: Results for orders placed or performed during the hospital encounter of 10/01/17 (from the past 48 hour(s))  Basic metabolic panel     Status: Abnormal   Collection Time: 10/05/17  8:10 PM  Result Value Ref Range   Sodium 145 135 - 145 mmol/L   Potassium 6.0 (H) 3.5 - 5.1 mmol/L    Comment: SPECIMEN HEMOLYZED. HEMOLYSIS MAY AFFECT INTEGRITY OF RESULTS.   Chloride 110 101 - 111 mmol/L   CO2 25 22 - 32 mmol/L   Glucose, Bld 331 (H) 65 - 99 mg/dL   BUN 77 (H) 6 - 20 mg/dL   Creatinine, Ser 1.57 (H) 0.61 - 1.24 mg/dL   Calcium 9.5 8.9 - 10.3 mg/dL   GFR calc non Af Amer 49 (L) >60 mL/min   GFR calc Af Amer 57 (L) >60 mL/min    Comment: (NOTE) The eGFR has been calculated using the CKD EPI equation. This calculation has not been validated in all clinical situations. eGFR's persistently <60 mL/min signify possible Chronic Kidney Disease.    Anion gap 10 5 - 15    Comment: Performed at [facility] [address] [place], [place] [address]  Glucose, capillary     Status: Abnormal   Collection Time: 10/05/17  8:15 PM  Result Value Ref Range   Glucose-Capillary 325 (H) 65 - 99 mg/dL   Comment 1 Notify RN   Basic metabolic  panel     Status: Abnormal   Collection Time: 10/05/17 10:49 PM  Result Value Ref Range   Sodium 145 135 - 145 mmol/L   Potassium 5.1 3.5 - 5.1 mmol/L   Chloride 110 101 - 111 mmol/L   CO2 26 22 - 32 mmol/L   Glucose, Bld 353 (H) 65 - 99  mg/dL   BUN 78 (H) 6 - 20 mg/dL   Creatinine, Ser 1.62 (H) 0.61 - 1.24 mg/dL   Calcium 9.5 8.9 - 10.3 mg/dL   GFR calc non Af Amer 48 (L) >60 mL/min   GFR calc Af Amer 55 (L) >60 mL/min    Comment: (NOTE) The eGFR has been calculated using the CKD EPI equation. This calculation has not been validated in all clinical situations. eGFR's persistently <60 mL/min signify possible Chronic Kidney Disease.    Anion gap 9 5 - 15    Comment: Performed at [facility] [address], [place], [place] [address]  Glucose, capillary     Status: Abnormal   Collection Time: 10/05/17 11:54 PM  Result Value Ref Range   Glucose-Capillary 334 (H) 65 - 99 mg/dL   Comment 1 Capillary Specimen    Comment 2 Notify RN   Glucose, capillary     Status: Abnormal   Collection Time: 10/06/17  3:43 AM  Result Value Ref Range   Glucose-Capillary 237 (H) 65 - 99 mg/dL   Comment 1 Capillary Specimen    Comment 2 Notify RN   Blood gas, arterial     Status: Abnormal   Collection Time: 10/06/17  4:08 AM  Result Value Ref Range   FIO2 0.90    Delivery systems VENTILATOR    Mode PRESSURE REGULATED VOLUME CONTROL    VT 600 mL   LHR 26 resp/min   Peep/cpap 10.0 cm H20   pH, Arterial 7.402 7.350 - 7.450   pCO2 arterial 44.1 32.0 - 48.0 mmHg   pO2, Arterial 99.5 83.0 - 108.0 mmHg   Bicarbonate 26.8 20.0 - 28.0 mmol/L   Acid-Base Excess 2.5 (H) 0.0 - 2.0 mmol/L   O2 Saturation 97.4 %   Patient temperature 98.6    Collection site RIGHT RADIAL    Drawn by [id number]    Sample type ARTERIAL    Allens test (pass/fail) PASS PASS   Mechanical Rate ARTERIAL   Basic metabolic panel     Status: Abnormal   Collection Time: 10/06/17  5:21 AM  Result Value Ref Range   Sodium 150  (H) 135 - 145 mmol/L   Potassium 4.5 3.5 - 5.1 mmol/L   Chloride 115 (H) 101 - 111 mmol/L   CO2 24 22 - 32 mmol/L   Glucose, Bld 242 (H) 65 - 99 mg/dL   BUN 78 (H) 6 - 20 mg/dL   Creatinine, Ser 1.37 (H) 0.61 - 1.24 mg/dL   Calcium 9.8 8.9 - 10.3 mg/dL   GFR calc non Af Amer 58 (L) >60 mL/min   GFR calc Af Amer >60 >60 mL/min    Comment: (NOTE) The eGFR has been calculated using the CKD EPI equation. This calculation has not been validated in all clinical situations. eGFR's persistently <60 mL/min signify possible Chronic Kidney Disease.    Anion gap 11 5 - 15    Comment: Performed at [facility] [address], [place], [place] [address]  Magnesium     Status: Abnormal   Collection Time: 10/06/17  5:21 AM  Result Value Ref Range   Magnesium 3.1 (H) 1.7 - 2.4 mg/dL    Comment: Performed at [facility] [address], [place], [place] [address]  Phosphorus     Status: None   Collection Time: 10/06/17  5:21 AM  Result Value Ref Range   Phosphorus 3.0 2.5 - 4.6 mg/dL    Comment: Performed  at [facility], [place] [address], [place] [address]  CBC with Differential/Platelet     Status: None   Collection Time: 10/06/17  5:21 AM  Result Value Ref Range   WBC 8.9 4.0 - 10.5 K/uL   RBC 4.54 4.22 - 5.81 MIL/uL   Hemoglobin 13.1 13.0 - 17.0 g/dL   HCT 42.9 39.0 - 52.0 %   MCV 94.5 78.0 - 100.0 fL   MCH 28.9 26.0 - 34.0 pg   MCHC 30.5 30.0 - 36.0 g/dL   RDW 13.1 11.5 - 15.5 %   Platelets 153 150 - 400 K/uL   Neutrophils Relative % 73 %   Neutro Abs 6.5 1.7 - 7.7 K/uL   Lymphocytes Relative 16 %   Lymphs Abs 1.4 0.7 - 4.0 K/uL   Monocytes Relative 11 %   Monocytes Absolute 1.0 0.1 - 1.0 K/uL   Eosinophils Relative 0 %   Eosinophils Absolute 0.0 0.0 - 0.7 K/uL   Basophils Relative 0 %   Basophils Absolute 0.0 0.0 - 0.1 K/uL    Comment: Performed at [facility], [place] [address] [place], [place] [address]  Glucose, capillary     Status: Abnormal     Collection Time: 10/06/17  7:57 AM  Result Value Ref Range   Glucose-Capillary 174 (H) 65 - 99 mg/dL   Comment 1 Capillary Specimen    Comment 2 Notify RN   Urinalysis, Routine w reflex microscopic     Status: Abnormal   Collection Time: 10/06/17 10:37 AM  Result Value Ref Range   Color, Urine YELLOW YELLOW   APPearance CLEAR CLEAR   Specific Gravity, Urine 1.020 1.005 - 1.030   pH 6.0 5.0 - 8.0   Glucose, UA NEGATIVE NEGATIVE mg/dL   Hgb urine dipstick SMALL (A) NEGATIVE   Bilirubin Urine NEGATIVE NEGATIVE   Ketones, ur NEGATIVE NEGATIVE mg/dL   Protein, ur 30 (A) NEGATIVE mg/dL   Nitrite NEGATIVE NEGATIVE   Leukocytes, UA NEGATIVE NEGATIVE   RBC / HPF 6-30 0 - 5 RBC/hpf   WBC, UA 0-5 0 - 5 WBC/hpf   Bacteria, UA RARE (A) NONE SEEN   Squamous Epithelial / LPF NONE SEEN NONE SEEN    Comment: Performed at [facility], [address], [place], [place] [address]  Urine Culture     Status: None   Collection Time: 10/06/17 10:38 AM  Result Value Ref Range   Specimen Description URINE, CATHETERIZED    Special Requests NONE    Culture      NO GROWTH Performed at [facility], [place] [address] [place], [place] [address]    Report Status 10/07/2017 FINAL   Glucose, capillary     Status: Abnormal   Collection Time: 10/06/17 11:45 AM  Result Value Ref Range   Glucose-Capillary 185 (H) 65 - 99 mg/dL   Comment 1 Capillary Specimen   Culture, respiratory (NON-Expectorated)     Status: None (Preliminary result)   Collection Time: 10/06/17 12:27 PM  Result Value Ref Range   Specimen Description TRACHEAL ASPIRATE    Special Requests NONE    Gram Stain      MODERATE WBC PRESENT,BOTH PMN AND MONONUCLEAR NO ORGANISMS SEEN    Culture      CULTURE REINCUBATED FOR BETTER GROWTH Performed at [facility], [place] [address], [place], [place] [address]    Report Status PENDING   Glucose, capillary     Status: Abnormal  Collection Time: 10/06/17  3:56 PM  Result Value  Ref Range   Glucose-Capillary 328 (H) 65 - 99 mg/dL   Comment 1 Capillary Specimen   APTT     Status: Abnormal   Collection Time: 10/06/17  8:01 PM  Result Value Ref Range   aPTT 47 (H) 24 - 36 seconds    Comment:        IF BASELINE aPTT IS ELEVATED, SUGGEST PATIENT RISK ASSESSMENT BE USED TO DETERMINE APPROPRIATE ANTICOAGULANT THERAPY. Performed at [facility], [place] [address], [place], [place] [address]   Heparin level (unfractionated)     Status: Abnormal   Collection Time: 10/06/17  8:01 PM  Result Value Ref Range   Heparin Unfractionated 1.16 (H) 0.30 - 0.70 IU/mL    Comment: RESULTS CONFIRMED BY MANUAL DILUTION Performed at [facility], [place] [address], [place], [place] [address]   Glucose, capillary     Status: Abnormal   Collection Time: 10/06/17  8:09 PM  Result Value Ref Range   Glucose-Capillary 299 (H) 65 - 99 mg/dL   Comment 1 Notify RN   Blood gas, arterial     Status: Abnormal   Collection Time: 10/06/17  8:27 PM  Result Value Ref Range   FIO2 80.00    Delivery systems VENTILATOR    Mode PRESSURE REGULATED VOLUME CONTROL    VT 600 mL   LHR 30 resp/min   Peep/cpap 16.0 cm H20   pH, Arterial 7.301 (L) 7.350 - 7.450   pCO2 arterial 59.6 (H) 32.0 - 48.0 mmHg   pO2, Arterial 121 (H) 83.0 - 108.0 mmHg   Bicarbonate 27.6 20.0 - 28.0 mmol/L   Acid-Base Excess 2.3 (H) 0.0 - 2.0 mmol/L   O2 Saturation 97.2 %   Patient temperature 102.2    Collection site A-LINE    Drawn by [id number]    Sample type ARTERIAL DRAW    Allens test (pass/fail) PASS PASS  Glucose, capillary     Status: Abnormal   Collection Time: 10/07/17 12:02 AM  Result Value Ref Range   Glucose-Capillary 274 (H) 65 - 99 mg/dL   Comment 1 Notify RN   Heparin level (unfractionated)     Status: Abnormal   Collection Time: 10/07/17  3:18 AM  Result Value Ref Range   Heparin Unfractionated 1.08 (H) 0.30 - 0.70 IU/mL    Comment: RESULTS CONFIRMED BY MANUAL DILUTION IF HEPARIN RESULTS ARE  BELOW EXPECTED VALUES, AND PATIENT DOSAGE HAS BEEN CONFIRMED, SUGGEST FOLLOW UP TESTING OF ANTITHROMBIN III LEVELS. Performed at [facility], [place] [address], [place], [place] [address]   Basic metabolic panel     Status: Abnormal   Collection Time: 10/07/17  3:18 AM  Result Value Ref Range   Sodium 150 (H) 135 - 145 mmol/L   Potassium 5.9 (H) 3.5 - 5.1 mmol/L    Comment: NO VISIBLE HEMOLYSIS DELTA CHECK NOTED    Chloride 117 (H) 101 - 111 mmol/L   CO2 23 22 - 32 mmol/L   Glucose, Bld 420 (H) 65 - 99 mg/dL   BUN 92 (H) 6 - 20 mg/dL   Creatinine, Ser 1.51 (H) 0.61 - 1.24 mg/dL   Calcium 9.7 8.9 - 10.3 mg/dL   GFR calc non Af Amer 52 (L) >60 mL/min   GFR calc Af Amer >60 >60 mL/min    Comment: (NOTE) The eGFR has been calculated using the CKD EPI equation. This calculation has not been validated in all  clinical situations. eGFR's persistently <60 mL/min signify possible Chronic Kidney Disease.    Anion gap 10 5 - 15    Comment: Performed at [facility] [address] [place], [place] [address]  CBC with Differential/Platelet     Status: Abnormal   Collection Time: 10/07/17  3:18 AM  Result Value Ref Range   WBC 6.7 4.0 - 10.5 K/uL   RBC 4.50 4.22 - 5.81 MIL/uL   Hemoglobin 12.6 (L) 13.0 - 17.0 g/dL   HCT 44.5 39.0 - 52.0 %   MCV 98.9 78.0 - 100.0 fL   MCH 28.0 26.0 - 34.0 pg   MCHC 28.3 (L) 30.0 - 36.0 g/dL   RDW 13.7 11.5 - 15.5 %   Platelets 142 (L) 150 - 400 K/uL   Neutrophils Relative % 82 %   Neutro Abs 5.5 1.7 - 7.7 K/uL   Lymphocytes Relative 13 %   Lymphs Abs 0.9 0.7 - 4.0 K/uL   Monocytes Relative 5 %   Monocytes Absolute 0.3 0.1 - 1.0 K/uL   Eosinophils Relative 0 %   Eosinophils Absolute 0.0 0.0 - 0.7 K/uL   Basophils Relative 0 %   Basophils Absolute 0.0 0.0 - 0.1 K/uL    Comment: Performed at [facility] [address], [place], [place] [address]  Magnesium     Status: Abnormal   Collection Time: 10/07/17  3:18 AM  Result Value  Ref Range   Magnesium 3.0 (H) 1.7 - 2.4 mg/dL    Comment: Performed at [facility] [address], [place], [place] [address]  Phosphorus     Status: Abnormal   Collection Time: 10/07/17  3:18 AM  Result Value Ref Range   Phosphorus 6.4 (H) 2.5 - 4.6 mg/dL    Comment: Performed at [facility] [address], [place], [place] [address]  APTT     Status: Abnormal   Collection Time: 10/07/17  3:18 AM  Result Value Ref Range   aPTT 72 (H) 24 - 36 seconds    Comment:        IF BASELINE aPTT IS ELEVATED, SUGGEST PATIENT RISK ASSESSMENT BE USED TO DETERMINE APPROPRIATE ANTICOAGULANT THERAPY. Performed at [facility], [place] [address], [place], [place] [address]   Procalcitonin     Status: None   Collection Time: 10/07/17  3:18 AM  Result Value Ref Range   Procalcitonin 0.24 ng/mL    Comment:        Interpretation: PCT (Procalcitonin) <= 0.5 ng/mL: Systemic infection (sepsis) is not likely. Local bacterial infection is possible. (NOTE)       Sepsis PCT Algorithm           Lower Respiratory Tract                                      Infection PCT Algorithm    ----------------------------     ----------------------------         PCT < 0.25 ng/mL                PCT < 0.10 ng/mL         Strongly encourage             Strongly discourage   discontinuation of antibiotics    initiation of antibiotics    ----------------------------     -----------------------------  PCT 0.25 - 0.50 ng/mL            PCT 0.10 - 0.25 ng/mL               OR       >80% decrease in PCT            Discourage initiation of                                            antibiotics      Encourage discontinuation           of antibiotics    ----------------------------     -----------------------------         PCT >= 0.50 ng/mL              PCT 0.26 - 0.50 ng/mL               AND        <80% decrease in PCT             Encourage initiation of                                              antibiotics       Encourage continuation           of antibiotics    ----------------------------     -----------------------------        PCT >= 0.50 ng/mL                  PCT > 0.50 ng/mL               AND         increase in PCT                  Strongly encourage                                      initiation of antibiotics    Strongly encourage escalation           of antibiotics                                     -----------------------------                                           PCT <= 0.25 ng/mL                                                 OR                                        > 80% decrease in PCT  Discontinue / Do not initiate                                             antibiotics Performed at [facility], [place] [address] [place], [place] [address]   Blood gas, arterial     Status: Abnormal   Collection Time: 10/07/17  3:32 AM  Result Value Ref Range   FIO2 80.00    Delivery systems VENTILATOR    Mode PRESSURE REGULATED VOLUME CONTROL    VT 600 mL   LHR 30 resp/min   Peep/cpap 16.0 cm H20   pH, Arterial 7.284 (L) 7.350 - 7.450   pCO2 arterial 53.1 (H) 32.0 - 48.0 mmHg   pO2, Arterial 145 (H) 83.0 - 108.0 mmHg   Bicarbonate 24.5 20.0 - 28.0 mmol/L   Acid-base deficit 1.4 0.0 - 2.0 mmol/L   O2 Saturation 98.7 %   Patient temperature 97.9    Collection site RIGHT RADIAL    Drawn by [phone]    Sample type ARTERIAL DRAW    Allens test (pass/fail) PASS PASS  Glucose, capillary     Status: Abnormal   Collection Time: 10/07/17  4:40 AM  Result Value Ref Range   Glucose-Capillary 371 (H) 65 - 99 mg/dL   Comment 1 Notify RN   Glucose, capillary     Status: Abnormal   Collection Time: 10/07/17  7:50 AM  Result Value Ref Range   Glucose-Capillary 346 (H) 65 - 99 mg/dL   Comment 1 Capillary Specimen    Comment 2 Notify RN   APTT     Status: Abnormal   Collection Time: 10/07/17 11:00 AM  Result Value Ref Range    aPTT >200 (HH) 24 - 36 seconds    Comment:        IF BASELINE aPTT IS ELEVATED, SUGGEST PATIENT RISK ASSESSMENT BE USED TO DETERMINE APPROPRIATE ANTICOAGULANT THERAPY. REPEATED TO VERIFY CRITICAL RESULT CALLED TO, READ BACK BY AND VERIFIED WITH: K WHITE RN [id number] 10/07/2017 BY A BENNETT Performed at [facility], [address], [place], [place] [address]   Glucose, capillary     Status: Abnormal   Collection Time: 10/07/17 11:35 AM  Result Value Ref Range   Glucose-Capillary 344 (H) 65 - 99 mg/dL   Comment 1 Capillary Specimen    Comment 2 Notify RN   Glucose, capillary     Status: Abnormal   Collection Time: 10/07/17  3:27 PM  Result Value Ref Range   Glucose-Capillary 255 (H) 65 - 99 mg/dL   Comment 1 Capillary Specimen     MICRO:  IMAGING: Dg Chest Port 1 View  Result Date: 10/07/2017 CLINICAL DATA:  Hx of ETT, sob EXAM: PORTABLE CHEST 1 VIEW COMPARISON:  10/06/2017 FINDINGS: Endotracheal tube is in place, tip approximately 6.5 centimeters above the carina. Nasogastric tube is in place with tip beyond the gastroesophageal junction off the image. Mild bibasilar atelectasis. No focal consolidations or pleural effusions. IMPRESSION: Mild bibasilar atelectasis. Electronically Signed   By: Nolon Nations M.D.   On: 10/07/2017 09:40   Dg Chest Port 1 View  Result Date: 10/06/2017 CLINICAL DATA:  Hypoxia EXAM: PORTABLE CHEST 1 VIEW COMPARISON:  October 05, 2017 FINDINGS: Endotracheal tube tip is 6.2 cm above the carina. Nasogastric tube tip and side port are below the diaphragm. No pneumothorax. There is a small right  pleural effusion. There is bibasilar atelectasis. There is consolidation in the medial left base. Lungs elsewhere are clear. Heart is borderline enlarged with pulmonary vascularity within normal limits. No adenopathy. There is degenerative change in each shoulder. IMPRESSION: Tube positions as described without pneumothorax. Small right pleural effusion with  bibasilar atelectasis. There is a degree of consolidation medial left base suspicious for pneumonia. No new opacity evident. Stable cardiac silhouette. Electronically Signed   By: Lowella Grip III M.D.   On: 10/06/2017 07:48   Dg Abd Portable 1v  Result Date: 10/06/2017 CLINICAL DATA:  Abdominal distension EXAM: PORTABLE ABDOMEN - 1 VIEW COMPARISON:  None. FINDINGS: Nasogastric tube tip and side port are in the stomach. There is a relative paucity of gas. There is no bowel dilatation or air-fluid level to indicate obstruction. No free air. There is postoperative change in the proximal right femur. IMPRESSION: Nasogastric tube tip and side port in stomach. There is a paucity of bowel gas. This finding may be seen normally but also may be indicative of ileus or enteritis. Bowel obstruction felt to be less likely. No evident free air. Electronically Signed   By: Lowella Grip III M.D.   On: 10/06/2017 09:04    HISTORICAL MICRO/IMAGING  Assessment/Plan:  52yo M with hiv disease- well controlled cd 4 count of 430/VL 22 on biktarvy, COPD on chronic O2 sup, poorly controlled DM admitted for influenza A/H3N2 related pneumonia, now HD #6 with fevers. Not at risk for oi since he was previously well controlled on his HIV regimen.interestingly has fevers in setting of normal WBC, hemodynamically stable  Fevers = continue to monitor his fever curve. Will follow his blood cx recently drawn  - recommend to d/c fluconazole and change to anidulafungin, for empiric coverage of invasive candidiasis , would also be the better agent for eso candidiasis  Respiratory distress vent dependent = maybe a difficult wean given his hx of lung disease, obesity ? Possibly OHS, defer to PCCM for vent wean   lnfluenza related pneumonia = would finish out 7 day course of cefepime tomorrow. Also currently on azithromycin which was added today probably to cover  Atypical. Would check ur legionella antigen  hiv disease =  continue with tivicay/descovy per NG  eso candidiasis = likely from inhaled steroids for COPD would be his risk factor. Will treat with anidulafungin  Will see tomorrow for more recs

## 2017-10-08 ENCOUNTER — Inpatient Hospital Stay (HOSPITAL_COMMUNITY): Payer: Medicare HMO

## 2017-10-08 DIAGNOSIS — I482 Chronic atrial fibrillation: Secondary | ICD-10-CM

## 2017-10-08 DIAGNOSIS — IMO0001 Reserved for inherently not codable concepts without codable children: Secondary | ICD-10-CM

## 2017-10-08 DIAGNOSIS — Z79899 Other long term (current) drug therapy: Secondary | ICD-10-CM

## 2017-10-08 DIAGNOSIS — R748 Abnormal levels of other serum enzymes: Secondary | ICD-10-CM

## 2017-10-08 DIAGNOSIS — Z978 Presence of other specified devices: Secondary | ICD-10-CM

## 2017-10-08 DIAGNOSIS — N179 Acute kidney failure, unspecified: Secondary | ICD-10-CM

## 2017-10-08 LAB — BASIC METABOLIC PANEL
ANION GAP: 9 (ref 5–15)
BUN: 126 mg/dL — ABNORMAL HIGH (ref 6–20)
CO2: 25 mmol/L (ref 22–32)
Calcium: 9.5 mg/dL (ref 8.9–10.3)
Chloride: 121 mmol/L — ABNORMAL HIGH (ref 101–111)
Creatinine, Ser: 2.31 mg/dL — ABNORMAL HIGH (ref 0.61–1.24)
GFR calc Af Amer: 36 mL/min — ABNORMAL LOW (ref >60)
GFR, EST NON AFRICAN AMERICAN: 31 mL/min — AB (ref >60)
GLUCOSE: 62 mg/dL — AB (ref 65–99)
Potassium: 4.1 mmol/L (ref 3.5–5.1)
Sodium: 155 mmol/L — ABNORMAL HIGH (ref 135–145)

## 2017-10-08 LAB — PHOSPHORUS: PHOSPHORUS: 3.4 mg/dL (ref 2.5–4.6)

## 2017-10-08 LAB — GLUCOSE, CAPILLARY
GLUCOSE-CAPILLARY: 55 mg/dL — AB (ref 65–99)
GLUCOSE-CAPILLARY: 193 mg/dL — AB (ref 65–99)
GLUCOSE-CAPILLARY: 147 mg/dL — AB (ref 65–99)
Glucose-Capillary: 70 mg/dL (ref 65–99)
Glucose-Capillary: 135 mg/dL — ABNORMAL HIGH (ref 65–99)
Glucose-Capillary: 59 mg/dL — ABNORMAL LOW (ref 65–99)
Glucose-Capillary: 154 mg/dL — ABNORMAL HIGH (ref 65–99)

## 2017-10-08 LAB — BLOOD GAS, ARTERIAL
ACID-BASE EXCESS: 0.3 mmol/L (ref 0.0–2.0)
BICARBONATE: 24.7 mmol/L (ref 20.0–28.0)
Drawn by: 345601
FIO2: 60
LHR: 35 {breaths}/min
MECHVT: 600 mL
O2 Saturation: 95.2 %
PEEP/CPAP: 12 cmH2O
Patient temperature: 98.6
pCO2 arterial: 42.5 mmHg (ref 32.0–48.0)
pH, Arterial: 7.383 (ref 7.350–7.450)
pO2, Arterial: 77.7 mmHg — ABNORMAL LOW (ref 83.0–108.0)

## 2017-10-08 LAB — TROPONIN I
TROPONIN I: 0.03 ng/mL — AB (ref <0.03)
TROPONIN I: 0.03 ng/mL — AB (ref <0.03)
Troponin I: 0.04 ng/mL (ref <0.03)

## 2017-10-08 LAB — CBC
HEMATOCRIT: 39.2 % (ref 39.0–52.0)
Hemoglobin: 11.3 g/dL — ABNORMAL LOW (ref 13.0–17.0)
MCH: 28.1 pg (ref 26.0–34.0)
MCHC: 28.8 g/dL — AB (ref 30.0–36.0)
MCV: 97.5 fL (ref 78.0–100.0)
PLATELETS: 139 10*3/uL — AB (ref 150–400)
RBC: 4.02 MIL/uL — ABNORMAL LOW (ref 4.22–5.81)
RDW: 13.5 % (ref 11.5–15.5)
WBC: 8.6 10*3/uL (ref 4.0–10.5)

## 2017-10-08 LAB — URINALYSIS, ROUTINE W REFLEX MICROSCOPIC
Bilirubin Urine: NEGATIVE
GLUCOSE, UA: NEGATIVE mg/dL
Ketones, ur: NEGATIVE mg/dL
LEUKOCYTES UA: NEGATIVE
NITRITE: NEGATIVE
Protein, ur: 100 mg/dL — AB
SPECIFIC GRAVITY, URINE: 1.025 (ref 1.005–1.030)
pH: 5 (ref 5.0–8.0)

## 2017-10-08 LAB — CULTURE, RESPIRATORY W GRAM STAIN: Culture: NORMAL

## 2017-10-08 LAB — MAGNESIUM: Magnesium: 3.4 mg/dL — ABNORMAL HIGH (ref 1.7–2.4)

## 2017-10-08 LAB — APTT
APTT: 41 s — AB (ref 24–36)
APTT: 49 s — AB (ref 24–36)
APTT: 52 s — AB (ref 24–36)

## 2017-10-08 LAB — CULTURE, RESPIRATORY

## 2017-10-08 LAB — HEPARIN LEVEL (UNFRACTIONATED): Heparin Unfractionated: 0.37 IU/mL (ref 0.30–0.70)

## 2017-10-08 LAB — CREATININE, URINE, RANDOM: Creatinine, Urine: 122.33 mg/dL

## 2017-10-08 LAB — SODIUM, URINE, RANDOM: SODIUM UR: 44 mmol/L

## 2017-10-08 MED ORDER — MIDAZOLAM HCL 2 MG/2ML IJ SOLN
4.0000 mg | Freq: Once | INTRAMUSCULAR | Status: AC
  Administered 2017-10-09: 2 mg via INTRAVENOUS
  Filled 2017-10-08: qty 4

## 2017-10-08 MED ORDER — ETOMIDATE 2 MG/ML IV SOLN
40.0000 mg | Freq: Once | INTRAVENOUS | Status: AC
  Administered 2017-10-09: 20 mg via INTRAVENOUS
  Filled 2017-10-08: qty 20

## 2017-10-08 MED ORDER — AMIODARONE IV BOLUS ONLY 150 MG/100ML
150.0000 mg | Freq: Once | INTRAVENOUS | Status: AC
  Administered 2017-10-08: 150 mg via INTRAVENOUS
  Filled 2017-10-08: qty 100

## 2017-10-08 MED ORDER — VECURONIUM BROMIDE 10 MG IV SOLR
10.0000 mg | Freq: Once | INTRAVENOUS | Status: DC
  Filled 2017-10-08: qty 10

## 2017-10-08 MED ORDER — HEPARIN BOLUS VIA INFUSION
2000.0000 [IU] | Freq: Once | INTRAVENOUS | Status: AC
  Administered 2017-10-08: 2000 [IU] via INTRAVENOUS
  Filled 2017-10-08: qty 2000

## 2017-10-08 MED ORDER — VITAL HIGH PROTEIN PO LIQD
1000.0000 mL | ORAL | Status: DC
  Administered 2017-10-08 – 2017-10-29 (×30): 1000 mL
  Filled 2017-10-08 (×16): qty 1000

## 2017-10-08 MED ORDER — DEXTROSE-NACL 5-0.2 % IV SOLN
INTRAVENOUS | Status: DC
  Administered 2017-10-08 (×2): via INTRAVENOUS

## 2017-10-08 MED ORDER — INSULIN GLARGINE 100 UNIT/ML ~~LOC~~ SOLN
30.0000 [IU] | Freq: Two times a day (BID) | SUBCUTANEOUS | Status: DC
  Administered 2017-10-08 – 2017-10-12 (×9): 30 [IU] via SUBCUTANEOUS
  Filled 2017-10-08 (×11): qty 0.3

## 2017-10-08 MED ORDER — FENTANYL CITRATE (PF) 100 MCG/2ML IJ SOLN: 200.0000 ug | Freq: Once | INTRAMUSCULAR | Status: DC

## 2017-10-08 MED ORDER — DEXTROSE 50 % IV SOLN
INTRAVENOUS | Status: AC
  Administered 2017-10-08: 50 mL
  Filled 2017-10-08: qty 50

## 2017-10-08 NOTE — Progress Notes (Signed)
I called Dr. Darrick Pennaeterding about patients HR staying more in the 130's. She stated that she doesn't want to do anything different at this time. Patient remains on Amiodarone gtt. Will continue to monitor patient & follow physician's orders closely.

## 2017-10-08 NOTE — Progress Notes (Addendum)
ANTICOAGULATION CONSULT NOTE - Follow-Up  Consult  Pharmacy Consult for heparin Indication: Concern for pulmonary embolus/afib  Allergies  Allergen Reactions  . Bactrim [Sulfamethoxazole-Trimethoprim] Hives    Patient Measurements: Height: 5\' 9"  (175.3 cm) Weight: 262 lb 12.6 oz (119.2 kg) IBW/kg (Calculated) : 70.7 Heparin Dosing Weight: 97.7 kg   Vital Signs: Temp: 100 F (37.8 C) (02/28 1400) Temp Source: Bladder (02/28 1200) BP: 102/67 (02/28 1400) Pulse Rate: 62 (02/28 1400)  Labs: Recent Labs    10/06/17 0521  10/06/17 2001 10/07/17 0318 10/07/17 1100 10/07/17 2033 10/08/17 0440 10/08/17 1000 10/08/17 1351  HGB 13.1  --   --  12.6*  --   --  11.3*  --   --   HCT 42.9  --   --  44.5  --   --  39.2  --   --   PLT 153  --   --  142*  --   --  139*  --   --   APTT  --    < > 47* 72* >200*  --  41*  --  49*  HEPARINUNFRC  --   --  1.16* 1.08*  --   --  0.37  --   --   CREATININE 1.37*  --   --  1.51*  --  1.65* 2.31*  --   --   TROPONINI  --   --   --   --   --   --   --  0.03*  --    < > = values in this interval not displayed.    Estimated Creatinine Clearance: 48.2 mL/min (A) (by C-G formula based on SCr of 2.31 mg/dL (H)).  Assessment: 52 yo male with a history of atrial fibrillation on Eliquis at home and here during hospital stay. There is a new concern that patient may have a pulmonary embolus so we are transitioning to heparin. Last dose of apixaban was given 2/25 at 2100.   APTT continues to be low after rate increase. We will continue to use aPTT level due to affect of Eliquis. CBC and platelets are slightly down and no signs of bleeding noted.   Goal of Therapy:  aptt 66-102 sec Heparin level 0.3-0.7 units/ml Monitor platelets by anticoagulation protocol: Yes   Plan:  Increase heparin to 1900 units/hr 6 hour aPTT  Daily aPTT/heparin level/CBC Monitor for signs/symptoms of bleeding   Sharin MonsEmily Dragan Tamburrino, PharmD, BCPS PGY2 Infectious Diseases  Pharmacy Resident Pager: [phone]

## 2017-10-08 NOTE — Progress Notes (Signed)
Nutrition Follow-up  DOCUMENTATION CODES:   Obesity unspecified  INTERVENTION:    Increase Vital High Protein to 65 ml/h (1560 ml per day; new goal rate)  D/C Pro-stat  Provides: 1560 kcal, 137 gm protein, 1304 ml free water daily  NUTRITION DIAGNOSIS:   Inadequate oral intake related to inability to eat as evidenced by NPO status.  Ongoing  GOAL:   Provide needs based on ASPEN/SCCM guidelines  Met with TF  MONITOR:   Vent status, TF tolerance, Labs, I & O's  ASSESSMENT:   52 yo male with PMH of COPD, smoker, HIV, DM, depression, GERD, A fib who was admitted on 2/21 with acute on chronic hypoxic respiratory failure, COPD exacerbation, influenza A. Required intubation shortly after admission.  Discussed patient in ICU rounds and with RN today. Patient remains on ventilator. Family meeting scheduled for today. Nephrology following for AKI.   Currently receiving Vital High Protein via OGT at 20 ml/h (480 ml/day) with Prostat 60 ml TID to provide 1080 kcals, 132 gm protein, 401 ml free water daily.  Free water flushes 300 ml every 4 hours. Patient is currently intubated on ventilator support Temp (24hrs), Avg:98.7 F (37.1 C), Min:97.7 F (36.5 C), Max:100.6 F (38.1 C)  Propofol: currently off  Labs reviewed. Sodium 155 (H) CBG's: [id number] Medications reviewed.  Diet Order:  Diet NPO time specified  EDUCATION NEEDS:   No education needs have been identified at this time  Skin:  Skin Assessment: Reviewed RN Assessment  Last BM:  2/28 rectal tube  Height:   Ht Readings from Last 1 Encounters:  10/01/17 '5\' 9"'  (1.753 m)    Weight:   Wt Readings from Last 1 Encounters:  10/07/17 262 lb 12.6 oz (119.2 kg)    Ideal Body Weight:  72.7 kg  BMI:  Body mass index is 38.81 kg/m.  Estimated Nutritional Needs:   Kcal:  1610-9604  Protein:  145 gm  Fluid:  2 L    Molli Barrows, RD, LDN, [place] Pager [phone] After Hours Pager  [phone]
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
## 2017-10-08 NOTE — Progress Notes (Signed)
ANTICOAGULATION CONSULT NOTE - Follow-Up  Consult  Pharmacy Consult for heparin Indication: Concern for pulmonary embolus/afib  Allergies  Allergen Reactions  . Bactrim [Sulfamethoxazole-Trimethoprim] Hives    Patient Measurements: Height: 5\' 9"  (175.3 cm) Weight: 262 lb 12.6 oz (119.2 kg) IBW/kg (Calculated) : 70.7 Heparin Dosing Weight: 97.7 kg   Vital Signs: Temp: 98.1 F (36.7 C) (02/28 0300) BP: 84/61 (02/28 0300) Pulse Rate: 115 (02/28 0301)  Labs: Recent Labs    10/06/17 0521  10/06/17 2001 10/07/17 0318 10/07/17 1100 10/07/17 2033 10/08/17 0440  HGB 13.1  --   --  12.6*  --   --  11.3*  HCT 42.9  --   --  44.5  --   --  39.2  PLT 153  --   --  142*  --   --  139*  APTT  --    < > 47* 72* >200*  --  41*  HEPARINUNFRC  --   --  1.16* 1.08*  --   --  0.37  CREATININE 1.37*  --   --  1.51*  --  1.65* 2.31*   < > = values in this interval not displayed.    Estimated Creatinine Clearance: 48.2 mL/min (A) (by C-G formula based on SCr of 2.31 mg/dL (H)).  Assessment: 52 yo male with a history of atrial fibrillation on Eliquis at home and here during hospital stay. There is a new concern that patient may have a pulmonary embolus so we are transitioning to heparin. Last dose of apixaban was given 2/25 at 2100.   APTT low this AM, using aPTT for now given apixaban influence on anti-Xa levels  Goal of Therapy:  aptt 66-102 sec Heparin level 0.3-0.7 units/ml Monitor platelets by anticoagulation protocol: Yes   Plan:  Inc heparin to 1750 units/hr 1400 aPTT/heparin level  Abran DukeJames Manahil Vanzile, PharmD, BCPS Clinical Pharmacist Phone: [phone]

## 2017-10-08 NOTE — Progress Notes (Addendum)
ANTICOAGULATION CONSULT NOTE - Follow-Up  Consult  Pharmacy Consult for heparin Indication: Concern for pulmonary embolus/afib  Allergies  Allergen Reactions  . Bactrim [Sulfamethoxazole-Trimethoprim] Hives    Patient Measurements: Height: 5\' 9"  (175.3 cm) Weight: 269 lb 13.5 oz (122.4 kg) IBW/kg (Calculated) : 70.7 Heparin Dosing Weight: 97.7 kg   Vital Signs: Temp: 100.9 F (38.3 C) (02/28 2200) Temp Source: Other (Comment) (02/28 1600) BP: 146/54 (02/28 2200) Pulse Rate: 76 (02/28 2200)  Labs: Recent Labs    10/06/17 0521  10/06/17 2001 10/07/17 0318  10/07/17 2033 10/08/17 0440 10/08/17 1000 10/08/17 1351 10/08/17 1622 10/08/17 2140  HGB 13.1  --   --  12.6*  --   --  11.3*  --   --   --   --   HCT 42.9  --   --  44.5  --   --  39.2  --   --   --   --   PLT 153  --   --  142*  --   --  139*  --   --   --   --   APTT  --    < > 47* 72*   < >  --  41*  --  49*  --  52*  HEPARINUNFRC  --   --  1.16* 1.08*  --   --  0.37  --   --   --   --   CREATININE 1.37*  --   --  1.51*  --  1.65* 2.31*  --   --   --   --   TROPONINI  --   --   --   --   --   --   --  0.03*  --  0.03*  --    < > = values in this interval not displayed.    Estimated Creatinine Clearance: 48.9 mL/min (A) (by C-G formula based on SCr of 2.31 mg/dL (H)).  Assessment: 52 yo male with a history of atrial fibrillation on Eliquis at home and here during hospital stay. There is a new concern that patient may have a pulmonary embolus so we are transitioning to heparin. Last dose of apixaban was given 2/25 at 2100.   APTT continues to be low after rate increase this morning. We will continue to use aPTT level due to affect of Eliquis but based on anti-xa levels this morning may; levels may be correlated by tomorrow morning. CBC and platelets were slightly down and no signs of bleeding noted.   Goal of Therapy:  aptt 66-102 sec Heparin level 0.3-0.7 units/ml Monitor platelets by anticoagulation  protocol: Yes   Plan:  Heparin bolus 2000 units Increase heparin to 2150 units/hr 6 hour aPTT  Daily aPTT/heparin level/CBC Monitor for signs/symptoms of bleeding   Sheppard CoilFrank Melyssa Signor PharmD., BCPS Clinical Pharmacist 10/08/2017 10:42 PM  Addendum: Morning aptt still low at 60, heparin level 0.47. It appears they still have not correlated yet. Will monitor and dose based on aptt for now.   Plan Increase heparin to 2350 units/hr Recheck this afternoon  10/09/2017 6:37 AM

## 2017-10-08 NOTE — Progress Notes (Addendum)
PULMONARY / CRITICAL CARE MEDICINE   Name: Chris [name]. MRN: [id number] DOB: 1966-01-09    ADMISSION DATE:  10/01/2017 CONSULTATION DATE:  2/21  REFERRING MD:  Juleen China  CHIEF COMPLAINT:  SOB  HISTORY OF PRESENT ILLNESS:   Chris Favors Tiner [name]. is a 52 y.o. male with medical history significant for HIV disease on Biktarvy, atrial fibrillation on Eliquis, COPD on prn nasal cannula oxygen, and diabetes on oral agent.  Patient is also obese.  He reports that for 3 days he has had rhinorrhea, generalized aching, cough with productive white sputum.  He has utilized his home MDIs without relief.  He forgot to use his nebulizers.  He has not had any nausea, vomiting or diarrhea but has not had much appetite.  He felt hot but stated he did not take his temperature.  He also increased his oxygen to 3 L/min from a baseline of 2 L but did not have any improvement in his difficulty breathing.  After arrival to the ER he has required stairstep increases in his oxygen due to persistent hypoxemia and currently is on high flow nasal cannula at 15 L/min.  He is still tachypneic and tachycardic.  He was found to be febrile with an oral temperature of 101.9 F.  Influenza PCR pending.  Chest x-ray without focal infiltrate.  He has been treated presumptively as COPD exacerbation with possible bronchitis and has been given a dose of oral prednisone.  Levaquin was also ordered but given patient's recent hospitalization/discharge on 07/28/17 I have opted to utilize broad-spectrum empiric antibiotic coverage.  Patient's last CD4 count was 430 on 2/15 with a viral load of 22. Because of increased work of breathing and persistent tachycardia/tachypnea patient was placed on bipap and admitted to ICU, intubated shortly after.  PAST MEDICAL HISTORY :  He  has a past medical history of Atrial fibrillation (HCC), COPD (chronic obstructive pulmonary disease) (HCC), Depression, Diabetes mellitus without complication (HCC), GERD  (gastroesophageal reflux disease), and HIV disease (HCC).  REVIEW OF SYSTEMS:   Unable to obtain  SUBJECTIVE:  Patient hypotensive, tachycardic to 150-180 overnight. Amio drip was started. He has had large volume stool output after SMOG, golytely. Abdomen remains distended. Kidney function worsening with decreased UOP.  VITAL SIGNS: BP 93/63   Pulse (!) 121   Temp 97.9 F (36.6 C)   Resp (!) 36   Ht 5\' 9"  (1.753 m)   Wt 262 lb 12.6 oz (119.2 kg)   SpO2 100%   BMI 38.81 kg/m   HEMODYNAMICS:    VENTILATOR SETTINGS: Vent Mode: PRVC FiO2 (%):  [50 %-80 %] 50 % Set Rate:  [30 bmp-35 bmp] 35 bmp Vt Set:  [600 mL] 600 mL PEEP:  [12 cmH20-16 cmH20] 12 cmH20 Plateau Pressure:  [24 cmH20-31 cmH20] 24 cmH20  INTAKE / OUTPUT: I/O last 3 completed shifts: In: 4658.1 [I.V.:2038.1; NG/GT:1620; IV Piggyback:1000] Out: 4945 [Urine:3245; Emesis/NG output:1000; Stool:700]  PHYSICAL EXAMINATION: General:  Obese man, laying in bed, sedated, intubated Neuro:  Sedated. PERRL HEENT:  ETT, OGT in place Cardiovascular:  RRR, no MRG Lungs:  Expiratory wheezing, decreased breath sounds bilaterally  Abdomen:  Obese, soft, distended, hypoactive bowel sounds Musculoskeletal:  SCD's in place. Warm. No edema Skin:  In tact  LABS:  BMET Recent Labs  Lab 10/07/17 0318 10/07/17 2033 10/08/17 0440  NA 150* 154* 155*  K 5.9* 4.3 4.1  CL 117* 121* 121*  CO2 23 24 25   BUN 92* 113* 126*  CREATININE  1.51* 1.65* 2.31*  GLUCOSE 420* 150* 62*   Electrolytes Recent Labs  Lab 10/06/17 0521 10/07/17 0318 10/07/17 2033 10/08/17 0440  CALCIUM 9.8 9.7 9.2 9.5  MG 3.1* 3.0*  --  3.4*  PHOS 3.0 6.4*  --  3.4   CBC Recent Labs  Lab 10/06/17 0521 10/07/17 0318 10/08/17 0440  WBC 8.9 6.7 8.6  HGB 13.1 12.6* 11.3*  HCT 42.9 44.5 39.2  PLT 153 142* 139*   Coag's Recent Labs  Lab 10/01/17 2023  10/07/17 0318 10/07/17 1100 10/08/17 0440  APTT 35   < > 72* >200* 41*  INR 1.13  --   --    --   --    < > = values in this interval not displayed.    Sepsis Markers Recent Labs  Lab 10/01/17 1515 10/01/17 1748 10/07/17 0318  LATICACIDVEN 1.82  --   --   PROCALCITON  --  0.12 0.24    ABG Recent Labs  Lab 10/06/17 2027 10/07/17 0332 10/08/17 0309  PHART 7.301* 7.284* 7.383  PCO2ART 59.6* 53.1* 42.5  PO2ART 121* 145* 77.7*    Liver Enzymes Recent Labs  Lab 10/02/17 0327  AST 23  ALT 18  ALKPHOS 44  BILITOT 0.8  ALBUMIN 3.4*    Cardiac Enzymes Recent Labs  Lab 10/01/17 2023  TROPONINI <0.03    Glucose Recent Labs  Lab 10/07/17 0750 10/07/17 1135 10/07/17 1527 10/07/17 1952 10/07/17 2320 10/08/17 0401  GLUCAP 346* 344* 255* 163* 121* 70    Imaging Dg Chest Port 1 View  Result Date: 10/08/2017 CLINICAL DATA:  Intubation. EXAM: PORTABLE CHEST 1 VIEW COMPARISON:  10/07/2017. FINDINGS: Endotracheal tube and NG tube in stable position. Stable cardiomegaly. Interim improvement of pulmonary venous congestion. Persistent basilar atelectasis. No pleural effusion or pneumothorax. IMPRESSION: 1.  Lines and tubes in stable position. 2. Stable cardiomegaly. Interim improvement of pulmonary venous congestion. 3.  Persistent bibasilar atelectasis. Electronically Signed   By: Maisie Fushomas  Register   On: 10/08/2017 06:51   Dg Chest Port 1 View  Result Date: 10/07/2017 CLINICAL DATA:  Hypoxia EXAM: PORTABLE CHEST 1 VIEW COMPARISON:  Study obtained earlier in the day FINDINGS: Endotracheal tube tip is 7.2 cm above the carina. There is no edema jugular catheter with the tip in the superior vena cava just beyond the junction with the left innominate vein. Nasogastric tube tip and side port are below the diaphragm. No pneumothorax. There is mild bibasilar atelectasis. Lungs elsewhere clear. Heart is mildly enlarged with pulmonary venous hypertension. No adenopathy. No bone lesions. IMPRESSION: Tube and catheter positions as described without pneumothorax. Pulmonary vascular  congestion without edema or consolidation. There is bibasilar atelectasis. Electronically Signed   By: Bretta BangWilliam  Woodruff III M.D.   On: 10/07/2017 17:52    STUDIES:  CXR 2/21 >> no frank edema or consolidation CXR 2/22>> Endotracheal tube above the carina. Enteric tube extends into the left upper abdomen with tip and side-port likely in the proximal to mid stomach. Cardiomegaly with bibasilar atelectatic changes. CXR 2/23 >>Increased atelectasis at the right lung base CXR 2/24>> Persistent patchy basilar infiltrates R>L TTE 2/25 > - Left ventricle: The cavity size was normal. Wall thickness was   normal. Systolic function was normal. The estimated ejection   fraction was in the range of 60% to 65%. - Mitral valve: There was mild regurgitation. KUB 2/26 > ileus vs enteritis, less likely SBO  CULTURES: RVP >Flu A positive Tracheal aspirate 2/22 > rare G+ cocci and  rods- reincubated > NRF Blood cx 2/21 > NGx5 days Trach asp 2/26 > moderate WBC, no organisms - reincubated Ucx 2/26 >> no growth Blood cx 26 >> PENDING  ANTIBIOTICS: Vancomycin 2/21 >>2/22 Cefepime 2/21 >> 2/28 Tamiflu 2/21 >> 2/26 Fluconazole 2/21 >> 2/27 Anidulafungin 2/27 >> Azithro 2/27 >>  SIGNIFICANT EVENTS: Admitted 2/21, intubated  LINES/TUBES: ETT 2/22 >> OGT 2/22 >> PIV Foley 2/22 >> Left IJ 2/27 >>>  DISCUSSION: 51yom with COPD on PRN oxygen presented with ~2 day h/o SOB. Now with marked respiratory distress, remains SOB on BiPAP. Intubated. Influenza positive, occult pneumonia; superimposed on COPD exacerbation. Tamiflu, broad-spectrum abx, steroids, nebs. Fungal coverage for thrush seen during intubation. Has been constipated requiring extensive bowel regimen to have BM. Afib uncontrolled, amio drip started.   ASSESSMENT / PLAN:  PULMONARY A: Acute on chronic respiratory failure w/ hypoxia COPD P:   Wean vent support as able Continue Xopenex and atrovent nebs Continue Brovana, pulmicort  nebs Continue cefepime for HCAP- last day today Heparin on due to concern for PE  CARDIOVASCULAR A:  History atrial fibrillation, currently in afib w/ RVR HTN P:  Continue Metoprolol prn Continue home cardizem On amio drip Will c/s cardiology Held eliquis, started heparin due to concern for PE Monitor on telemetry  RENAL A:   AKI- FeNa <1% - improving Hyperkalemia-resolved hypernatremia P:   Monitor BMP Free water per tube Avoid nephrotoxic agents  GASTROINTESTINAL A:   Obesity SUP Constipation- large volume BM 2/27 P:   IV PPI Tube feeds at goal Bowel regimen: colace, senna, dulcolax suppositories, fleet enema prn  HEMATOLOGIC A:   On Eliquis for afib Concern for PE P:  Monitor CBC on heparin  INFECTIOUS A:   HIV last CD4 >400, viral load 22.  Flu positive- Received course of Tamiflu esophageal candidiasis noted during intubation. P:   ID following, appreciate recs Added azithro 2/27, complete 5 day course Continue Cefepime, last day today Continue Tivicay, Descovay for HIV Diflucan switched to eraxis per ID Repeated pan cultures 2/26 due to fever, follow results   ENDOCRINE A:   Type 2 DM  - Last A1C 10.5 P:   Decrease lantus to 30 units BID now that steroids are off Resistant SSI, may need to back down to moderate CBG q4   NEUROLOGIC  A:   Sedated for intubation P:   RASS goal: 0, -1 Continue sedation w/ propofol and fentanyl Versed prn  FAMILY  - Updates: updated wife at bedside 2/27 - Inter-disciplinary family meet or Palliative Care meeting due by: 3/1  Dolores Patty, DO PGY-2, [facility] 10/08/2017 7:57 AM  Attending Note:  52 year old male with HIV and flu who presents to the hospital with respiratory failure and ARDS.  On exam, decreased BS diffusely.  I reviewed CXR myself, ETT is 6 cm away from carina.  Will let stay for now since will trach in AM.  D/C lasix.  Continue free water.  Trach in AM.  Continue  colace and senna for bowel regiment.  Abx per ID, appreciate input.  Will need volume negative but renal function prohibits diureses.  Will call renal consult.  No family bedside to update.  The patient is critically ill with multiple organ systems failure and requires high complexity decision making for assessment and support, frequent evaluation and titration of therapies, application of advanced monitoring technologies and extensive interpretation of multiple databases.   Critical Care Time devoted to patient care services described  in this note is  35  Minutes. This time reflects time of care of this signee Dr Jennet Maduro. This critical care time does not reflect procedure time, or teaching time or supervisory time of PA/NP/Med student/Med Resident etc but could involve care discussion time.  Rush Farmer, M.D. [facility] Pulmonary/Critical Care Medicine. Pager: [phone]. After hours pager: [phone].

## 2017-10-08 NOTE — Progress Notes (Signed)
[name] for Infectious Disease    Date of Admission:  10/01/2017   Total days of antibiotics 8        Day 2 azithro        Day 2 anidula           ID: [name] is a 52 y.o. male with respiratory distress due to complications from flu now with afib, rvr, and aki Principal Problem:   Acute on chronic respiratory failure with hypoxia (HCC) Active Problems:   COPD exacerbation (HCC)   Diabetes mellitus type 2 in obese ([name])   COPD with exacerbation (HCC)   Atrial fibrillation (HCC)   HIV (human immunodeficiency virus infection) ([name])   Tobacco abuse   Bronchitis    Subjective: Afebrile overnight but tends to have fevers in evening. Had started afib with rvr, on amio drip. aki worsening on labs  Medications:  . arformoterol  15 mcg Nebulization BID  . budesonide (PULMICORT) nebulizer solution  0.5 mg Nebulization BID  . chlorhexidine gluconate (MEDLINE KIT)  15 mL Mouth Rinse BID  . clonazePAM  1 mg Per Tube BID  . diltiazem  30 mg Per Tube Q6H  . dolutegravir  50 mg Oral Daily  . emtricitabine-tenofovir AF  1 tablet Per Tube Daily  . famotidine  20 mg Per Tube BID  . feeding supplement (VITAL HIGH PROTEIN)  1,000 mL Per Tube Q24H  . free water  300 mL Per Tube Q4H  . insulin aspart  0-20 Units Subcutaneous Q4H  . insulin glargine  30 Units Subcutaneous BID  . ipratropium  0.5 mg Nebulization Q4H  . levalbuterol  0.63 mg Nebulization Q4H  . mouth rinse  15 mL Mouth Rinse 10 times per day  . senna  1 tablet Oral Daily  . sodium chloride flush  3 mL Intravenous Q12H    Objective: Vital signs in last 24 hours: Temp:  [97.7 F (36.5 C)-100.6 F (38.1 C)] 100.6 F (38.1 C) (02/28 1517) Pulse Rate:  [37-235] 120 (02/28 1517) Resp:  [32-36] 32 (02/28 1517) BP: (71-188)/(54-172) 99/66 (02/28 1517) SpO2:  [89 %-100 %] 91 % (02/28 1517) FiO2 (%):  [50 %-70 %] 50 % (02/28 1517) Physical Exam  Constitutional: He is sedated intubated. He appears  well-developed and well-nourished. No distress.  HENT: l ij in place Mouth/Throat: OETT in place Cardiovascular: irrreg, irreg, regular rhythm and normal heart sounds. Exam reveals no gallop and no friction rub.  No murmur heard.  Pulmonary/Chest: Effort normal and breath sounds normal.decreased at bases Abdominal: portuberant,Soft. Bowel sounds are normal. He exhibits no distension. There is no tenderness.  Skin: Skin is warm and dry. No rash noted. No erythema.    Lab Results Recent Labs    10/07/17 0318 10/07/17 2033 10/08/17 0440  WBC 6.7  --  8.6  HGB 12.6*  --  11.3*  HCT 44.5  --  39.2  NA 150* 154* 155*  K 5.9* 4.3 4.1  CL 117* 121* 121*  CO2 '23 24 25  ' BUN 92* 113* 126*  CREATININE 1.51* 1.65* 2.31*    Microbiology: reviewed Studies/Results: US Renal  Result Date: 10/08/2017 CLINICAL DATA:  Acute renal insufficiency EXAM: RENAL ULTRASOUND COMPARISON:  Abdominal ultrasound August 15, 2016 FINDINGS: Right Kidney: Length: 13.9 cm. Echogenicity and renal cortical thickness are within normal limits. No mass or perinephric fluid. There is slight fullness of the right renal collecting system. No sonographically demonstrable calculus or ureterectasis. Left Kidney: Length:  13.8 cm. Echogenicity and renal cortical thickness are within normal limits. No mass or perinephric fluid. There is slight fullness of the left renal collecting system. No sonographically demonstrable calculus or ureterectasis. There is slight Bladder: Urinary bladder is decompressed with a Foley catheter. IMPRESSION: Slight fullness of each renal collecting system. No obstructing focus evident on either side. Kidneys otherwise appear unremarkable bilaterally. Urinary bladder decompressed with Foley catheter and cannot be assessed. Electronically Signed   By: Lowella Grip III M.D.   On: 10/08/2017 15:11   Dg Chest Port 1 View  Result Date: 10/08/2017 CLINICAL DATA:  Intubation. EXAM: PORTABLE CHEST 1 VIEW  COMPARISON:  10/07/2017. FINDINGS: Endotracheal tube and NG tube in stable position. Stable cardiomegaly. Interim improvement of pulmonary venous congestion. Persistent basilar atelectasis. No pleural effusion or pneumothorax. IMPRESSION: 1.  Lines and tubes in stable position. 2. Stable cardiomegaly. Interim improvement of pulmonary venous congestion. 3.  Persistent bibasilar atelectasis. Electronically Signed   By: Marcello Moores  Register   On: 10/08/2017 06:51   Dg Chest Port 1 View  Result Date: 10/07/2017 CLINICAL DATA:  Hypoxia EXAM: PORTABLE CHEST 1 VIEW COMPARISON:  Study obtained earlier in the day FINDINGS: Endotracheal tube tip is 7.2 cm above the carina. There is no edema jugular catheter with the tip in the superior vena cava just beyond the junction with the left innominate vein. Nasogastric tube tip and side port are below the diaphragm. No pneumothorax. There is mild bibasilar atelectasis. Lungs elsewhere clear. Heart is mildly enlarged with pulmonary venous hypertension. No adenopathy. No bone lesions. IMPRESSION: Tube and catheter positions as described without pneumothorax. Pulmonary vascular congestion without edema or consolidation. There is bibasilar atelectasis. Electronically Signed   By: Lowella Grip III M.D.   On: 10/07/2017 17:52   Dg Chest Port 1 View  Result Date: 10/07/2017 CLINICAL DATA:  Hx of ETT, sob EXAM: PORTABLE CHEST 1 VIEW COMPARISON:  10/06/2017 FINDINGS: Endotracheal tube is in place, tip approximately 6.5 centimeters above the carina. Nasogastric tube is in place with tip beyond the gastroesophageal junction off the image. Mild bibasilar atelectasis. No focal consolidations or pleural effusions. IMPRESSION: Mild bibasilar atelectasis. Electronically Signed   By: Nolon Nations M.D.   On: 10/07/2017 09:40     Assessment/Plan: 51yoM with underlying copd and  Sup o2 needs, well controlled hiv, poorly controlled DM admitted for complications of flu with respiratory  distress with 2nd pneumonia. Having fevers despite 7-8d abtx. Now also having afib with rvr and aki  aki = appreciate renal involvement. Currently his HIV regimen is ok til his GFR<30. Will can do a different regimen he continues to progressively worsen from renal standpoint.   hiv disease = can continue on current regimen  resp distress = agree with pccm that he will likely need trach as part of prolonged vent wean  Fever = unclear source. Would follow temp curve and follow recent cultures taken.  eso candidiasis=continue on anidulafungin  CAP = he recently finished tx yesterday, currently on azithromycin. Will send  Urine for ur legionella.  Dr comer to see tomorrow  [facility] Cell: [phone] Pager: [phone]  10/08/2017, 4:15 PM

## 2017-10-08 NOTE — Progress Notes (Signed)
I spoke with Dr. Darrick Pennaeterding about patients increased A.Fib to 130's & decreased blood pressure with a MAP of 62.  She stated to continue to go down on his fentanyl and see if his pressure gets better. She stated she is not concerned with his HR since he is on Amiodarone gtt at this time. Will continue to monitor patient closely & follow physician's orders.
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
## 2017-10-09 ENCOUNTER — Inpatient Hospital Stay (HOSPITAL_COMMUNITY): Payer: Medicare HMO

## 2017-10-09 ENCOUNTER — Encounter (HOSPITAL_COMMUNITY): Payer: Medicare HMO

## 2017-10-09 DIAGNOSIS — J9600 Acute respiratory failure, unspecified whether with hypoxia or hypercapnia: Secondary | ICD-10-CM

## 2017-10-09 DIAGNOSIS — I480 Paroxysmal atrial fibrillation: Secondary | ICD-10-CM

## 2017-10-09 DIAGNOSIS — I499 Cardiac arrhythmia, unspecified: Secondary | ICD-10-CM

## 2017-10-09 DIAGNOSIS — R000 Tachycardia, unspecified: Secondary | ICD-10-CM

## 2017-10-09 DIAGNOSIS — I4891 Unspecified atrial fibrillation: Secondary | ICD-10-CM

## 2017-10-09 LAB — GLUCOSE, CAPILLARY
GLUCOSE-CAPILLARY: 204 mg/dL — AB (ref 65–99)
GLUCOSE-CAPILLARY: 188 mg/dL — AB (ref 65–99)
GLUCOSE-CAPILLARY: 149 mg/dL — AB (ref 65–99)
GLUCOSE-CAPILLARY: 150 mg/dL — AB (ref 65–99)
Glucose-Capillary: 159 mg/dL — ABNORMAL HIGH (ref 65–99)
Glucose-Capillary: 184 mg/dL — ABNORMAL HIGH (ref 65–99)
Glucose-Capillary: 189 mg/dL — ABNORMAL HIGH (ref 65–99)

## 2017-10-09 LAB — POCT I-STAT 3, ART BLOOD GAS (G3+)
Acid-base deficit: 2 mmol/L (ref 0.0–2.0)
Bicarbonate: 23.9 mmol/L (ref 20.0–28.0)
O2 SAT: 88 %
PH ART: 7.319 — AB (ref 7.350–7.450)
PO2 ART: 62 mmHg — AB (ref 83.0–108.0)
Patient temperature: 37.9
TCO2: 25 mmol/L (ref 22–32)
pCO2 arterial: 47 mmHg (ref 32.0–48.0)

## 2017-10-09 LAB — APTT
APTT: 66 s — AB (ref 24–36)
aPTT: 60 seconds — ABNORMAL HIGH (ref 24–36)
aPTT: 68 seconds — ABNORMAL HIGH (ref 24–36)

## 2017-10-09 LAB — CBC WITH DIFFERENTIAL/PLATELET
Basophils Absolute: 0 10*3/uL (ref 0.0–0.1)
Basophils Relative: 0 %
Eosinophils Absolute: 0.2 10*3/uL (ref 0.0–0.7)
Eosinophils Relative: 2 %
HEMATOCRIT: 34.9 % — AB (ref 39.0–52.0)
HEMOGLOBIN: 10.4 g/dL — AB (ref 13.0–17.0)
LYMPHS ABS: 1.5 10*3/uL (ref 0.7–4.0)
LYMPHS PCT: 16 %
MCH: 28.3 pg (ref 26.0–34.0)
MCHC: 29.8 g/dL — AB (ref 30.0–36.0)
MCV: 94.8 fL (ref 78.0–100.0)
MONOS PCT: 9 %
Monocytes Absolute: 0.9 10*3/uL (ref 0.1–1.0)
NEUTROS PCT: 73 %
Neutro Abs: 7.3 10*3/uL (ref 1.7–7.7)
Platelets: 126 10*3/uL — ABNORMAL LOW (ref 150–400)
RBC: 3.68 MIL/uL — ABNORMAL LOW (ref 4.22–5.81)
RDW: 13.8 % (ref 11.5–15.5)
WBC: 10 10*3/uL (ref 4.0–10.5)

## 2017-10-09 LAB — RENAL FUNCTION PANEL
ANION GAP: 12 (ref 5–15)
Albumin: 2.3 g/dL — ABNORMAL LOW (ref 3.5–5.0)
BUN: 150 mg/dL — AB (ref 6–20)
CO2: 21 mmol/L — ABNORMAL LOW (ref 22–32)
Calcium: 8.7 mg/dL — ABNORMAL LOW (ref 8.9–10.3)
Chloride: 112 mmol/L — ABNORMAL HIGH (ref 101–111)
Creatinine, Ser: 3.11 mg/dL — ABNORMAL HIGH (ref 0.61–1.24)
GFR calc Af Amer: 25 mL/min — ABNORMAL LOW (ref >60)
GFR, EST NON AFRICAN AMERICAN: 22 mL/min — AB (ref >60)
Glucose, Bld: 227 mg/dL — ABNORMAL HIGH (ref 65–99)
POTASSIUM: 4.6 mmol/L (ref 3.5–5.1)
Phosphorus: 3.9 mg/dL (ref 2.5–4.6)
Sodium: 145 mmol/L (ref 135–145)

## 2017-10-09 LAB — PROTIME-INR
INR: 1.2
Prothrombin Time: 15.1 seconds (ref 11.4–15.2)

## 2017-10-09 LAB — TROPONIN I: Troponin I: 0.05 ng/mL (ref <0.03)

## 2017-10-09 LAB — HEPARIN LEVEL (UNFRACTIONATED)
Heparin Unfractionated: 0.47 IU/mL (ref 0.30–0.70)
Heparin Unfractionated: 0.45 IU/mL (ref 0.30–0.70)

## 2017-10-09 LAB — MAGNESIUM: Magnesium: 2.9 mg/dL — ABNORMAL HIGH (ref 1.7–2.4)

## 2017-10-09 MED ORDER — SODIUM CHLORIDE 0.9 % IV SOLN
INTRAVENOUS | Status: DC
  Administered 2017-10-09 – 2017-10-10 (×3): via INTRAVENOUS

## 2017-10-09 MED ORDER — ROCURONIUM BROMIDE 50 MG/5ML IV SOLN
50.0000 mg | Freq: Once | INTRAVENOUS | Status: AC
  Administered 2017-10-09: 50 mg via INTRAVENOUS

## 2017-10-09 NOTE — Progress Notes (Signed)
   10/09/17 1000  Clinical Encounter Type  Visited With Patient and family together  Visit Type Initial  Referral From Chaplain  Consult/Referral To Chaplain  Spiritual Encounters  Spiritual Needs Emotional  Stress Factors  Patient Stress Factors Exhausted  Family Stress Factors Exhausted    Pt was intubated. Wife on-site and very tearful. Pt's wife shared about the pt and talked about her hopefulness as trachea was to be put to replace the breathing support. Pt's wife asked for prayer which I helped lead. Chaplain provided emotional support through reflective listening, compassionate presence and prayer. Pt and wife will need follow up.  Ayda Tancredi a Water quality scientistMusiko-Holley, E. I. du PontChaplain

## 2017-10-09 NOTE — Progress Notes (Addendum)
PULMONARY / CRITICAL CARE MEDICINE   Name: Chris [name]. MRN: [id number] DOB: 12/05/1965    ADMISSION DATE:  10/01/2017 CONSULTATION DATE:  2/21  REFERRING MD:  Juleen China  CHIEF COMPLAINT:  SOB  HISTORY OF PRESENT ILLNESS:   Chris [name] Hageman. is a 52 y.o. male with medical history significant for HIV disease on Biktarvy, atrial fibrillation on Eliquis, COPD on prn nasal cannula oxygen, and diabetes on oral agent.  Patient is also obese.  He reports that for 3 days he has had rhinorrhea, generalized aching, cough with productive white sputum.  He has utilized his home MDIs without relief.  He forgot to use his nebulizers.  He has not had any nausea, vomiting or diarrhea but has not had much appetite.  He felt hot but stated he did not take his temperature.  He also increased his oxygen to 3 L/min from a baseline of 2 L but did not have any improvement in his difficulty breathing.  After arrival to the ER he has required stairstep increases in his oxygen due to persistent hypoxemia and currently is on high flow nasal cannula at 15 L/min.  He is still tachypneic and tachycardic.  He was found to be febrile with an oral temperature of 101.9 F.  Influenza PCR pending.  Chest x-ray without focal infiltrate.  He has been treated presumptively as COPD exacerbation with possible bronchitis and has been given a dose of oral prednisone.  Levaquin was also ordered but given patient's recent hospitalization/discharge on 07/28/17 I have opted to utilize broad-spectrum empiric antibiotic coverage.  Patient's last CD4 count was 430 on 2/15 with a viral load of 22. Because of increased work of breathing and persistent tachycardia/tachypnea patient was placed on bipap and admitted to ICU, intubated shortly after.  PAST MEDICAL HISTORY :  He  has a past medical history of Atrial fibrillation (HCC), COPD (chronic obstructive pulmonary disease) (HCC), Depression, Diabetes mellitus without complication (HCC), GERD  (gastroesophageal reflux disease), and HIV disease (HCC).  REVIEW OF SYSTEMS:   Unable to obtain  SUBJECTIVE:  Tube feeds off for trach today. Still needing high PEEP/FiO2 .  VITAL SIGNS: BP (!) 159/68   Pulse 91   Temp (!) 100.4 F (38 C)   Resp (!) 30   Ht 5\' 9"  (1.753 m)   Wt 124.1 kg (273 lb 9.5 oz)   SpO2 96%   BMI 40.40 kg/m   HEMODYNAMICS: CVP:  [11 mmHg-15 mmHg] 11 mmHg  VENTILATOR SETTINGS: Vent Mode: PRVC FiO2 (%):  [40 %-50 %] 50 % Set Rate:  [30 bmp] 30 bmp Vt Set:  [600 mL] 600 mL PEEP:  [10 cmH20] 10 cmH20 Plateau Pressure:  [28 cmH20-31 cmH20] 31 cmH20  INTAKE / OUTPUT: I/O last 3 completed shifts: In: 6650.6 [I.V.:3685.6; Other:100; NG/GT:2285; IV Piggyback:580] Out: 2130 [Urine:1030; Stool:1100]  PHYSICAL EXAMINATION: General:  Obese black adult male in NAD Neuro:  Sedated on vent. Eyes open to voice. RASS -1 HEENT:  ETT, OGT in place Cardiovascular:  RRR, no MRG Lungs:  Diminished throughout Abdomen:  Obese, soft, non- distended, hypoactive bowel sounds Musculoskeletal:  SCD's in place. Warm. No edema Skin:  Grossly intact LABS:  BMET Recent Labs  Lab 10/07/17 2033 10/08/17 0440 10/09/17 0355  NA 154* 155* 145  K 4.3 4.1 4.6  CL 121* 121* 112*  CO2 24 25 21*  BUN 113* 126* 150*  CREATININE 1.65* 2.31* 3.11*  GLUCOSE 150* 62* 227*   Electrolytes Recent Labs  Lab 10/07/17 0318 10/07/17 2033 10/08/17 0440 10/09/17 0355  CALCIUM 9.7 9.2 9.5 8.7*  MG 3.0*  --  3.4* 2.9*  PHOS 6.4*  --  3.4 3.9   CBC Recent Labs  Lab 10/07/17 0318 10/08/17 0440 10/09/17 0355  WBC 6.7 8.6 10.0  HGB 12.6* 11.3* 10.4*  HCT 44.5 39.2 34.9*  PLT 142* 139* 126*   Coag's Recent Labs  Lab 10/08/17 1351 10/08/17 2140 10/09/17 0355  APTT 49* 52* 60*  INR  --   --  1.20    Sepsis Markers Recent Labs  Lab 10/07/17 0318  PROCALCITON 0.24    ABG Recent Labs  Lab 10/07/17 0332 10/08/17 0309 10/09/17 0314  PHART 7.284* 7.383 7.319*   PCO2ART 53.1* 42.5 47.0  PO2ART 145* 77.7* 62.0*    Liver Enzymes Recent Labs  Lab 10/09/17 0355  ALBUMIN 2.3*    Cardiac Enzymes Recent Labs  Lab 10/08/17 1622 10/08/17 2140 10/09/17 0355  TROPONINI 0.03* 0.04* 0.05*    Glucose Recent Labs  Lab 10/08/17 1201 10/08/17 1545 10/08/17 2000 10/08/17 2349 10/09/17 0334 10/09/17 0745  GLUCAP 193* 154* 147* 184* 204* 188*    Imaging [place] Renal  Result Date: 10/08/2017 CLINICAL DATA:  Acute renal insufficiency EXAM: RENAL ULTRASOUND COMPARISON:  Abdominal ultrasound August 15, 2016 FINDINGS: Right Kidney: Length: 13.9 cm. Echogenicity and renal cortical thickness are within normal limits. No mass or perinephric fluid. There is slight fullness of the right renal collecting system. No sonographically demonstrable calculus or ureterectasis. Left Kidney: Length: 13.8 cm. Echogenicity and renal cortical thickness are within normal limits. No mass or perinephric fluid. There is slight fullness of the left renal collecting system. No sonographically demonstrable calculus or ureterectasis. There is slight Bladder: Urinary bladder is decompressed with a Foley catheter. IMPRESSION: Slight fullness of each renal collecting system. No obstructing focus evident on either side. Kidneys otherwise appear unremarkable bilaterally. Urinary bladder decompressed with Foley catheter and cannot be assessed. Electronically Signed   By: Bretta BangWilliam  Woodruff III M.D.   On: 10/08/2017 15:11   Dg Chest Port 1 View  Result Date: 10/09/2017 CLINICAL DATA:  Intubation. EXAM: PORTABLE CHEST 1 VIEW COMPARISON:  10/08/2017. FINDINGS: Endotracheal tube, NG tube, left IJ line stable position. Cardiomegaly with pulmonary vascular congestion. Mild bilateral interstitial prominence. Findings suggest mild CHF. No prominent pleural effusion or pneumothorax. Right costophrenic angle not imaged. IMPRESSION: 1.  Lines and tubes in stable position. 2. Cardiomegaly with pulmonary  venous congestion and mild bilateral interstitial prominence noted on today's exam. Findings suggest mild CHF. Electronically Signed   By: Maisie Fushomas  Register   On: 10/09/2017 06:45    STUDIES:  CXR 2/21 >> no frank edema or consolidation CXR 2/22>> Endotracheal tube above the carina. Enteric tube extends into the left upper abdomen with tip and side-port likely in the proximal to mid stomach. Cardiomegaly with bibasilar atelectatic changes. CXR 2/23 >>Increased atelectasis at the right lung base CXR 2/24>> Persistent patchy basilar infiltrates R>L TTE 2/25 > Left ventricle: The cavity size was normal. Wall thickness was normal. Systolic function was normal. The estimated ejection   fraction was in the range of 60% to 65%. - Mitral valve: There was mild regurgitation   KUB 2/26 > ileus vs enteritis, less likely SBO  CULTURES: RVP >Flu A positive Tracheal aspirate 2/22 > rare G+ cocci and rods- reincubated > NRF Blood cx 2/21 > NGx5 days Trach asp 2/26 > normal flora Ucx 2/26 >> no growth Blood cx 26 >> PENDING  ANTIBIOTICS: Vancomycin 2/21 >>2/22 Cefepime 2/21 >> 2/28 Tamiflu 2/21 >> 2/26 Fluconazole 2/21 >> 2/27 Anidulafungin 2/27 >> Azithro 2/27 >> 3/1  SIGNIFICANT EVENTS: Admitted 2/21, intubated  LINES/TUBES: ETT 2/22 >> 3/1 OGT 2/22 >>3/1 PIV >> Foley 2/22 >> Left IJ 2/27 >>>  DISCUSSION: 51yom with COPD on PRN oxygen presented with ~2 day h/o SOB. Now with marked respiratory distress, remains SOB on BiPAP. Intubated. Influenza positive, occult pneumonia; superimposed on COPD exacerbation. Tamiflu, broad-spectrum abx, steroids, nebs. Fungal coverage for thrush seen during intubation. Has been constipated requiring extensive bowel regimen to have BM. Afib uncontrolled, amio drip started.   ASSESSMENT / PLAN:  PULMONARY A: Acute on chronic respiratory failure w/ hypoxia ARDS Flu > improving, however, remains hypoxemic.  COPD P:   Trached today, hope to wean sedation  and PEEP/FiO2 Continue Xopenex and atrovent nebs Continue Brovana, pulmicort nebs Off ABX Heparin on due to concern for PE  CARDIOVASCULAR A:  History atrial fibrillation, currently in afib w/ RVR HTN P:  Continue Metoprolol prn Continue home diltiazem On amio drip, will decrease rate today.  Held eliquis, on heparin due to concern for PE Monitor on telemetry  RENAL A:   AKI - worsening, nephrology feels this is secondary to hypoperfusion vs toxic with early ATN.  Metabolic acidosis - likely dt worsening renal failure Hyperkalemia-resolved Hypernatremia - resolved P:   Monitor BMP Free water per tube Avoid nephrotoxic agents Nephrology following, going to volume expand today.   GASTROINTESTINAL A:   Obesity SUP Constipation- large volume BM 2/27 P:   IV PPI Tube feeds at goal Bowel regimen: colace, senna, dulcolax suppositories, fleet enema prn  HEMATOLOGIC A:   On Eliquis for afib Concern for PE P:  Monitor CBC on heparin per pharmacy  INFECTIOUS A:   HIV last CD4 >400, viral load 22.  Flu positive- Received course of Tamiflu esophageal candidiasis noted during intubation. P:   ID following, appreciate recs Azithro off, urine legionella pending.  ceftriazone off Continue Tivicay, Descovay for HIV Diflucan switched to eraxis per ID, will plan on 7 day course 3/6 Repeated pan cultures 2/26 due to fever, follow results   ENDOCRINE A:   Type 2 DM  - Last A1C 10.5 P:   Lantus at 30 units BID now that steroids are off Resistant SSI CBG q4   NEUROLOGIC  A:   Sedated for intubation P:   RASS goal: 0, -1 Fentanyl infusion, will wean to off this afternoon PRN fent/versed.   FAMILY  - Updates: updated wife at bedside 3/1 - Inter-disciplinary family meet or Palliative Care meeting due by: 3/1  Joneen Roach, AGACNP-BC  Pulmonology/Critical Care Pager [phone] or [phone]  10/09/2017 12:03 PM  Attending Note:  52 year old male  with HIV and flu A presenting with ARDS and respiratory failure.  Patient is improving from an FiO2/PEEP standpoint but remains not weanable.  On exam, coarse BS diffusely.  I reviewed CXR myself, ETT is in good position and infiltrate noted.  Discussed with PCCM-NP.  Will proceed with tracheostomy today.  After will continue weaning efforts.  Hold off on PEG for now.  Titrate O2 and PEEP as able.  Continue diureses.  The patient is critically ill with multiple organ systems failure and requires high complexity decision making for assessment and support, frequent evaluation and titration of therapies, application of advanced monitoring technologies and extensive interpretation of multiple databases.   Critical Care Time devoted to patient care services described in  this note is  35  Minutes. This time reflects time of care of this signee Dr Jennet Maduro. This critical care time does not reflect procedure time, or teaching time or supervisory time of PA/NP/Med student/Med Resident etc but could involve care discussion time.  Rush Farmer, M.D. [facility] Pulmonary/Critical Care Medicine. Pager: [phone]. After hours pager: [phone].

## 2017-10-09 NOTE — Progress Notes (Signed)
ANTICOAGULATION CONSULT NOTE - Follow Up Consult  Pharmacy Consult for heparin Indication: pulmonary embolus/Afib  Allergies  Allergen Reactions  . Bactrim [Sulfamethoxazole-Trimethoprim] Hives    Patient Measurements: Height: 5\' 9"  (175.3 cm) Weight: 273 lb 9.5 oz (124.1 kg) IBW/kg (Calculated) : 70.7 Heparin Dosing Weight: 97.7 kg  Vital Signs: Temp: 100.6 F (38.1 C) (03/01 2000) Temp Source: Bladder (03/01 2000) BP: 156/61 (03/01 2000) Pulse Rate: 99 (03/01 2000)  Labs: Recent Labs    10/07/17 0318  10/07/17 2033 10/08/17 0440  10/08/17 1622 10/08/17 2140 10/09/17 0355 10/09/17 1247 10/09/17 2000  HGB 12.6*  --   --  11.3*  --   --   --  10.4*  --   --   HCT 44.5  --   --  39.2  --   --   --  34.9*  --   --   PLT 142*  --   --  139*  --   --   --  126*  --   --   APTT 72*   < >  --  41*   < >  --  52* 60* 66*  --   LABPROT  --   --   --   --   --   --   --  15.1  --   --   INR  --   --   --   --   --   --   --  1.20  --   --   HEPARINUNFRC 1.08*  --   --  0.37  --   --   --  0.47  --  0.45  CREATININE 1.51*  --  1.65* 2.31*  --   --   --  3.11*  --   --   TROPONINI  --   --   --   --    < > 0.03* 0.04* 0.05*  --   --    < > = values in this interval not displayed.    Estimated Creatinine Clearance: 36.6 mL/min (A) (by C-G formula based on SCr of 3.11 mg/dL (H)).   Assessment: [age] YOM with history of Afib on Eliquis at home, last dose given 2/25 @ 2100. New concern for PE this admission so switched to heparin. APTT this afternoon was 66, just barely within range.  Hep lvl now 0.45 no need to follow further aPTT  Goal of Therapy:  Heparin level 0.3-0.7 units/ml Monitor platelets by anticoagulation protocol: Yes   Plan:  Continue heparin  2450 units/hr Daily aPTT/heparin level/CBC Monitor s/sx of bleeding  Isaac BlissMichael Ronny Ruddell, PharmD, BCPS, BCCCP Clinical Pharmacist Clinical phone for 10/09/2017 from 1430 - 2300: [phone] If after 2300, please call main  pharmacy at: [phone] 10/09/2017 9:45 PM

## 2017-10-09 NOTE — Progress Notes (Signed)
[facility]   Reason for visit: Follow up on HIV, acute respiratory ffailure  Interval History: remains intubated, PEEP 12, CXR with pulmonary vascular congestion - personally reviewed and mild compared to previous.   Physical Exam: Constitutional:  Vitals:   10/09/17 0741 10/09/17 0800  BP: (!) 143/65 (!) 142/64  Pulse: 87 90  Resp: (!) 30   Temp: (!) 100.9 F (38.3 C) (!) 100.8 F (38.2 C)  SpO2: 93% 94%   patient responds to name, wife, who is at bedside Eyes: anicteric HENT: +ET Respiratory: respiratory effort on vent; diffuse rhonchi Cardiovascular: tachy irr  GI: soft, nt, nd  Review of Systems: Unable to be assessed due to mental status  Lab Results  Component Value Date   WBC 10.0 10/09/2017   HGB 10.4 (L) 10/09/2017   HCT 34.9 (L) 10/09/2017   MCV 94.8 10/09/2017   PLT 126 (L) 10/09/2017    Lab Results  Component Value Date   CREATININE 3.11 (H) 10/09/2017   BUN 150 (H) 10/09/2017   NA 145 10/09/2017   K 4.6 10/09/2017   CL 112 (H) 10/09/2017   CO2 21 (L) 10/09/2017    Lab Results  Component Value Date   ALT 18 10/02/2017   AST 23 10/02/2017   ALKPHOS 44 10/02/2017     Microbiology: Recent Results (from the past 240 hour(s))  Culture, blood (Routine X 2) w Reflex to ID Panel     Status: None   Collection Time: 10/01/17  7:15 PM  Result Value Ref Range Status   Specimen Description BLOOD RIGHT HAND  Final   Special Requests   Final    BOTTLES DRAWN AEROBIC AND ANAEROBIC Blood Culture adequate volume   Culture   Final    NO GROWTH 5 DAYS Performed at  [facility], [address] [place], [place] [address]    Report Status 10/06/2017 FINAL  Final  Culture, blood (Routine X 2) w Reflex to ID Panel     Status: None   Collection Time: 10/01/17  7:21 PM  Result Value Ref Range Status   Specimen Description BLOOD RIGHT WRIST  Final   Special Requests   Final    BOTTLES DRAWN AEROBIC AND ANAEROBIC Blood  Culture adequate volume   Culture   Final    NO GROWTH 5 DAYS Performed at [facility], [address] [place], [place] [address]    Report Status 10/06/2017 FINAL  Final  Respiratory Panel by PCR     Status: Abnormal   Collection Time: 10/01/17  9:02 PM  Result Value Ref Range Status   Adenovirus NOT DETECTED NOT DETECTED Final   Coronavirus 229E NOT DETECTED NOT DETECTED Final   Coronavirus HKU1 NOT DETECTED NOT DETECTED Final   Coronavirus NL63 NOT DETECTED NOT DETECTED Final   Coronavirus OC43 NOT DETECTED NOT DETECTED Final   Metapneumovirus NOT DETECTED NOT DETECTED Final   Rhinovirus / Enterovirus NOT DETECTED NOT DETECTED Final   Influenza A H3 DETECTED (A) NOT DETECTED Final   Influenza B NOT DETECTED NOT DETECTED Final   Parainfluenza Virus 1 NOT DETECTED NOT DETECTED Final   Parainfluenza Virus 2 NOT DETECTED NOT DETECTED Final   Parainfluenza Virus 3 NOT DETECTED NOT DETECTED Final   Parainfluenza Virus 4 NOT DETECTED NOT DETECTED Final   Respiratory Syncytial Virus NOT DETECTED NOT DETECTED Final   Bordetella pertussis NOT DETECTED NOT DETECTED Final   Chlamydophila pneumoniae NOT DETECTED NOT  DETECTED Final   Mycoplasma pneumoniae NOT DETECTED NOT DETECTED Final    Comment: Performed at [facility], [address] [place], [place] [address]  MRSA PCR Screening     Status: None   Collection Time: 10/01/17  9:02 PM  Result Value Ref Range Status   MRSA by PCR NEGATIVE NEGATIVE Final    Comment:        The GeneXpert MRSA Assay (FDA approved for NASAL specimens only), is one component of a comprehensive MRSA colonization surveillance program. It is not intended to diagnose MRSA infection nor to guide or monitor treatment for MRSA infections. Performed at [facility], [address] [place], [place] [address]   Culture, respiratory (NON-Expectorated)     Status: None   Collection Time: 10/02/17 11:53 AM  Result Value Ref Range Status    Specimen Description TRACHEAL ASPIRATE  Final   Special Requests NONE  Final   Gram Stain   Final    FEW WBC PRESENT, PREDOMINANTLY PMN RARE GRAM POSITIVE COCCI RARE GRAM POSITIVE RODS    Culture   Final    MODERATE Consistent with normal respiratory flora. Performed at [facility], [address] [place], [place] [address]    Report Status 10/05/2017 FINAL  Final  Urine Culture     Status: None   Collection Time: 10/06/17 10:38 AM  Result Value Ref Range Status   Specimen Description URINE, CATHETERIZED  Final   Special Requests NONE  Final   Culture   Final    NO GROWTH Performed at [facility], [address] [place], [place] [address]    Report Status 10/07/2017 FINAL  Final  Culture, respiratory (NON-Expectorated)     Status: None   Collection Time: 10/06/17 12:27 PM  Result Value Ref Range Status   Specimen Description TRACHEAL ASPIRATE  Final   Special Requests NONE  Final   Gram Stain   Final    MODERATE WBC PRESENT,BOTH PMN AND MONONUCLEAR NO ORGANISMS SEEN    Culture   Final    MODERATE Consistent with normal respiratory flora. Performed at [facility], [address] [place], [place] [address]    Report Status 10/08/2017 FINAL  Final  Culture, blood (routine x 2)     Status: None (Preliminary result)   Collection Time: 10/07/17 11:00 AM  Result Value Ref Range Status   Specimen Description BLOOD RIGHT ANTECUBITAL  Final   Special Requests   Final    BOTTLES DRAWN AEROBIC AND ANAEROBIC Blood Culture adequate volume   Culture   Final    NO GROWTH 1 DAY Performed at [facility], [address], [place], [place] [address]    Report Status PENDING  Incomplete  Culture, blood (routine x 2)     Status: None (Preliminary result)   Collection Time: 10/07/17 11:15 AM  Result Value Ref Range Status   Specimen Description BLOOD RIGHT HAND  Final   Special Requests   Final    BOTTLES DRAWN AEROBIC AND ANAEROBIC Blood Culture adequate  volume   Culture   Final    NO GROWTH 1 DAY Performed at [facility], [address], [place], [place] [address]    Report Status PENDING  Incomplete    Impression/Plan:  1. HIV - on Tivicay and Descovy and will continue.  He has been well controlled.  2.  Renal failure - increased creat today.  If remains elevated  tomorrow, will need dose adjustment of #1.    3.  Fever - persists; no new positive culture growth.  No changes.  Has been on azithromycin 2 days, no specific indication at this time.  I will stop.  I will though also check urine legionella.    4.  Influenza - resolved.    5.  Pneumonia - now s/p treatment with cefepime.    6.  Respiratory failure - ARDS likely with high peep.  Management per CCM.    Dr. Ninetta Lights on over the weekend.

## 2017-10-09 NOTE — Progress Notes (Signed)
Tube feed was turned off at 05:15 am 10/09/17, not at midnight.

## 2017-10-09 NOTE — Procedures (Signed)
Bedside Tracheostomy Insertion Procedure Note   Patient Details:   Name: [name] [name] DOB: 03/07/1966 MRN: [id number]  Procedure: Tracheostomy  Pre Procedure Assessment: ET Tube Size: 7.5 ET Tube secured at lip (cm): 23 Bite block in place: No Breath Sounds: Clear  Post Procedure Assessment: BP (!) 159/68   Pulse 91   Temp (!) 100.4 F (38 C)   Resp (!) 30   Ht 5\' 9"  (1.753 m)   Wt 273 lb 9.5 oz (124.1 kg)   SpO2 96%   BMI 40.40 kg/m  O2 sats: stable throughout Complications: No apparent complications Patient did tolerate procedure well Tracheostomy Brand:Shiley Tracheostomy Style:Cuffed Tracheostomy Size: 6 Tracheostomy Secured NWG:NFAOZHYvia:Sutures, velcro Tracheostomy Placement Confirmation:Trach cuff visualized and in place and Chest X ray ordered for placement    Jacqulynn CadetHopper, [place] David 10/09/2017, 11:49 AM

## 2017-10-09 NOTE — Progress Notes (Signed)
Subjective: Interval History: has no complaint, on vent , awakens. Rate controlled.   Objective: Vital signs in last 24 hours: Temp:  [97.9 F (36.6 C)-101.5 F (38.6 C)] 100.8 F (38.2 C) (03/01 0600) Pulse Rate:  [42-137] 96 (03/01 0600) Resp:  [30-36] 30 (03/01 0301) BP: (93-153)/(54-91) 148/58 (03/01 0600) SpO2:  [89 %-100 %] 94 % (03/01 0600) FiO2 (%):  [40 %-50 %] 50 % (03/01 0400) Weight:  [122.4 kg (269 lb 13.5 oz)-124.1 kg (273 lb 9.5 oz)] 124.1 kg (273 lb 9.5 oz) (03/01 0413) Weight change:   Intake/Output from previous day: 02/28 0701 - 03/01 0700 In: 5216.2 [I.V.:2911.2; NG/GT:1825; IV Piggyback:380] Out: 1010 [Urine:610; Stool:400] Intake/Output this shift: No intake/output data recorded.  General appearance: moderately obese and sedated, on vent , opens eyes when stim,  Resp: diminished breath sounds bilaterally and rhonchi bilaterally Cardio: irregularly irregular rhythm and systolic murmur: holosystolic 2/6, blowing at apex GI: obese, pos bs, nontender Extremities: edema Tr  Lab Results: Recent Labs    10/08/17 0440 10/09/17 0355  WBC 8.6 10.0  HGB 11.3* 10.4*  HCT 39.2 34.9*  PLT 139* 126*   BMET:  Recent Labs    10/08/17 0440 10/09/17 0355  NA 155* 145  K 4.1 4.6  CL 121* 112*  CO2 25 21*  GLUCOSE 62* 227*  BUN 126* 150*  CREATININE 2.31* 3.11*  CALCIUM 9.5 8.7*   No results for input(s): PTH in the last 72 hours. Iron Studies: No results for input(s): IRON, TIBC, TRANSFERRIN, FERRITIN in the last 72 hours.  Studies/Results: [place] Renal  Result Date: 10/08/2017 CLINICAL DATA:  Acute renal insufficiency EXAM: RENAL ULTRASOUND COMPARISON:  Abdominal ultrasound August 15, 2016 FINDINGS: Right Kidney: Length: 13.9 cm. Echogenicity and renal cortical thickness are within normal limits. No mass or perinephric fluid. There is slight fullness of the right renal collecting system. No sonographically demonstrable calculus or ureterectasis. Left Kidney:  Length: 13.8 cm. Echogenicity and renal cortical thickness are within normal limits. No mass or perinephric fluid. There is slight fullness of the left renal collecting system. No sonographically demonstrable calculus or ureterectasis. There is slight Bladder: Urinary bladder is decompressed with a Foley catheter. IMPRESSION: Slight fullness of each renal collecting system. No obstructing focus evident on either side. Kidneys otherwise appear unremarkable bilaterally. Urinary bladder decompressed with Foley catheter and cannot be assessed. Electronically Signed   By: Bretta BangWilliam  Woodruff III M.D.   On: 10/08/2017 15:11   Dg Chest Port 1 View  Result Date: 10/08/2017 CLINICAL DATA:  Intubation. EXAM: PORTABLE CHEST 1 VIEW COMPARISON:  10/07/2017. FINDINGS: Endotracheal tube and NG tube in stable position. Stable cardiomegaly. Interim improvement of pulmonary venous congestion. Persistent basilar atelectasis. No pleural effusion or pneumothorax. IMPRESSION: 1.  Lines and tubes in stable position. 2. Stable cardiomegaly. Interim improvement of pulmonary venous congestion. 3.  Persistent bibasilar atelectasis. Electronically Signed   By: Maisie Fushomas  Register   On: 10/08/2017 06:51   Dg Chest Port 1 View  Result Date: 10/07/2017 CLINICAL DATA:  Hypoxia EXAM: PORTABLE CHEST 1 VIEW COMPARISON:  Study obtained earlier in the day FINDINGS: Endotracheal tube tip is 7.2 cm above the carina. There is no edema jugular catheter with the tip in the superior vena cava just beyond the junction with the left innominate vein. Nasogastric tube tip and side port are below the diaphragm. No pneumothorax. There is mild bibasilar atelectasis. Lungs elsewhere clear. Heart is mildly enlarged with pulmonary venous hypertension. No adenopathy. No bone lesions. IMPRESSION: Tube  and catheter positions as described without pneumothorax. Pulmonary vascular congestion without edema or consolidation. There is bibasilar atelectasis. Electronically  Signed   By: Bretta Bang III M.D.   On: 10/07/2017 17:52    I have reviewed the patient's current medications.  Assessment/Plan: 1 AKI vol ok.  Cr ^,  Mild acidemia.  Urine indices c/w hypoperfusion, vs toxic, with early ATN.  Suspect hypoperfusion. SNa corrected so will cont to expand vol with NS/LR. ?? Less likely role of Tenofavir. 2 VDRF Acute injury on top of COPD . Still issue per CCM 3 Anemia  4 Infx per ID on multiple AB  5 Obesity 6 HIV 7 Afib rate better. 8 DM controlled P Iv NS, support bp, cont vent per CCM, AB,     LOS: 8 days   Fayrene Fearing Shristi Scheib 10/09/2017,7:08 AM

## 2017-10-09 NOTE — Progress Notes (Signed)
CRITICAL VALUE ALERT  Critical Value:  Troponin   Date & Time Notied:  10/09/2017 05:10  Provider Notified: Pola CornElink Doctor   Orders Received/Actions taken: continue to monitor.

## 2017-10-09 NOTE — Progress Notes (Signed)
ANTICOAGULATION CONSULT NOTE - Follow Up Consult  Pharmacy Consult for heparin Indication: pulmonary embolus/Afib  Allergies  Allergen Reactions  . Bactrim [Sulfamethoxazole-Trimethoprim] Hives    Patient Measurements: Height: '5\' 9"'  (175.3 cm) Weight: 273 lb 9.5 oz (124.1 kg) IBW/kg (Calculated) : 70.7 Heparin Dosing Weight: 97.7 kg  Vital Signs: Temp: 101.1 F (38.4 C) (03/01 1200) BP: 142/62 (03/01 1200) Pulse Rate: 88 (03/01 1200)  Labs: Recent Labs    10/07/17 0318  10/07/17 2033 10/08/17 0440  10/08/17 1622 10/08/17 2140 10/09/17 0355 10/09/17 1247  HGB 12.6*  --   --  11.3*  --   --   --  10.4*  --   HCT 44.5  --   --  39.2  --   --   --  34.9*  --   PLT 142*  --   --  139*  --   --   --  126*  --   APTT 72*   < >  --  41*   < >  --  52* 60* 66*  LABPROT  --   --   --   --   --   --   --  15.1  --   INR  --   --   --   --   --   --   --  1.20  --   HEPARINUNFRC 1.08*  --   --  0.37  --   --   --  0.47  --   CREATININE 1.51*  --  1.65* 2.31*  --   --   --  3.11*  --   TROPONINI  --   --   --   --    < > 0.03* 0.04* 0.05*  --    < > = values in this interval not displayed.    Estimated Creatinine Clearance: 36.6 mL/min (A) (by C-G formula based on SCr of 3.11 mg/dL (H)).   Medications:  Scheduled:  . arformoterol  15 mcg Nebulization BID  . budesonide (PULMICORT) nebulizer solution  0.5 mg Nebulization BID  . chlorhexidine gluconate (MEDLINE KIT)  15 mL Mouth Rinse BID  . clonazePAM  1 mg Per Tube BID  . diltiazem  30 mg Per Tube Q6H  . dolutegravir  50 mg Oral Daily  . emtricitabine-tenofovir AF  1 tablet Per Tube Daily  . famotidine  20 mg Per Tube BID  . feeding supplement (VITAL HIGH PROTEIN)  1,000 mL Per Tube Q24H  . fentaNYL (SUBLIMAZE) injection  200 mcg Intravenous Once  . free water  300 mL Per Tube Q4H  . insulin aspart  0-20 Units Subcutaneous Q4H  . insulin glargine  30 Units Subcutaneous BID  . ipratropium  0.5 mg Nebulization Q4H  .  levalbuterol  0.63 mg Nebulization Q4H  . mouth rinse  15 mL Mouth Rinse 10 times per day  . senna  1 tablet Oral Daily  . sodium chloride flush  3 mL Intravenous Q12H  . vecuronium  10 mg Intravenous Once   Infusions:  . sodium chloride 250 mL (10/09/17 0600)  . sodium chloride 100 mL/hr at 10/09/17 0703  . amiodarone 30 mg/hr (10/09/17 1200)  . anidulafungin Stopped (10/08/17 2045)  . fentaNYL infusion INTRAVENOUS 100 mcg/hr (10/09/17 1200)  . heparin 2,350 Units/hr (10/09/17 1315)    Assessment: 49 YOM with history of Afib on Eliquis at home, last dose given 2/25 @ 2100. New concern for PE this admission so switched  to heparin. APTT this afternoon was 66, just barely within range. Last heparin level from this morning was 0.47, did not correlate with aPTT at that time.  Since treating for new PE and aPTT is at low end of goal range, will increase heparin rate.  Goal of Therapy:  Heparin level 0.3-0.7 units/ml aPTT 66-102 seconds Monitor platelets by anticoagulation protocol: Yes   Plan:  Increase heparin to 2450 units/hr Recheck 6-hour aPTT and heparin level - may discontinue aPTT if correlating at that time Daily aPTT/heparin level/CBC Monitor s/sx of bleeding   Charlene Brooke, PharmD PGY1 Pharmacy Resident Pager: [phone] After 4:00PM please call [place] [phone] 10/09/2017,1:49 PM

## 2017-10-09 NOTE — Progress Notes (Signed)
Progress Note  Patient Name: Chris [name]. Date of Encounter: 10/09/2017  Primary Cardiologist: No primary care provider on file.   Subjective   Currently getting a trach at bedside.  Converted to NSR on IV Amio  Inpatient Medications    Scheduled Meds: . arformoterol  15 mcg Nebulization BID  . budesonide (PULMICORT) nebulizer solution  0.5 mg Nebulization BID  . chlorhexidine gluconate (MEDLINE KIT)  15 mL Mouth Rinse BID  . clonazePAM  1 mg Per Tube BID  . diltiazem  30 mg Per Tube Q6H  . dolutegravir  50 mg Oral Daily  . emtricitabine-tenofovir AF  1 tablet Per Tube Daily  . famotidine  20 mg Per Tube BID  . feeding supplement (VITAL HIGH PROTEIN)  1,000 mL Per Tube Q24H  . fentaNYL (SUBLIMAZE) injection  200 mcg Intravenous Once  . free water  300 mL Per Tube Q4H  . insulin aspart  0-20 Units Subcutaneous Q4H  . insulin glargine  30 Units Subcutaneous BID  . ipratropium  0.5 mg Nebulization Q4H  . levalbuterol  0.63 mg Nebulization Q4H  . mouth rinse  15 mL Mouth Rinse 10 times per day  . senna  1 tablet Oral Daily  . sodium chloride flush  3 mL Intravenous Q12H  . vecuronium  10 mg Intravenous Once   Continuous Infusions: . sodium chloride 250 mL (10/09/17 0600)  . sodium chloride 100 mL/hr at 10/09/17 0703  . amiodarone 60 mg/hr (10/09/17 1055)  . anidulafungin Stopped (10/08/17 2045)  . fentaNYL infusion INTRAVENOUS 100 mcg/hr (10/09/17 0600)  . heparin 2,350 Units/hr (10/09/17 0646)   PRN Meds: sodium chloride, acetaminophen (TYLENOL) oral liquid 160 mg/5 mL, [DISCONTINUED] acetaminophen **OR** acetaminophen, bisacodyl, docusate, fentaNYL, hydrALAZINE, metoprolol tartrate, midazolam, ondansetron (ZOFRAN) IV   Vital Signs    Vitals:   10/09/17 0800 10/09/17 0900 10/09/17 1000 10/09/17 1150  BP: (!) 142/64 (!) 153/64 (!) 159/68   Pulse: 90 92 91   Resp:      Temp: (!) 100.8 F (38.2 C) (!) 100.8 F (38.2 C) (!) 100.4 F (38 C)   TempSrc:        SpO2: 94% 94% 96% 98%  Weight:      Height:        Intake/Output Summary (Last 24 hours) at 10/09/2017 1153 Last data filed at 10/09/2017 0600 Gross per 24 hour  Intake 4243.81 ml  Output 550 ml  Net 3693.81 ml   Filed Weights   10/07/17 0500 10/08/17 1700 10/09/17 0413  Weight: 262 lb 12.6 oz (119.2 kg) 269 lb 13.5 oz (122.4 kg) 273 lb 9.5 oz (124.1 kg)    Telemetry    NSR - Personally Reviewed  ECG    No new EKG to review - Personally Reviewed  Physical Exam   PE not done as patient draped for sterile procedure  Labs    Chemistry Recent Labs  Lab 10/07/17 2033 10/08/17 0440 10/09/17 0355  NA 154* 155* 145  K 4.3 4.1 4.6  CL 121* 121* 112*  CO2 24 25 21*  GLUCOSE 150* 62* 227*  BUN 113* 126* 150*  CREATININE 1.65* 2.31* 3.11*  CALCIUM 9.2 9.5 8.7*  ALBUMIN  --   --  2.3*  GFRNONAA 47* 31* 22*  GFRAA 54* 36* 25*  ANIONGAP '9 9 12     ' Hematology Recent Labs  Lab 10/07/17 0318 10/08/17 0440 10/09/17 0355  WBC 6.7 8.6 10.0  RBC 4.50 4.02* 3.68*  HGB 12.6*  11.3* 10.4*  HCT 44.5 39.2 34.9*  MCV 98.9 97.5 94.8  MCH 28.0 28.1 28.3  MCHC 28.3* 28.8* 29.8*  RDW 13.7 13.5 13.8  PLT 142* 139* 126*    Cardiac Enzymes Recent Labs  Lab 10/08/17 1000 10/08/17 1622 10/08/17 2140 10/09/17 0355  TROPONINI 0.03* 0.03* 0.04* 0.05*   No results for input(s): TROPIPOC in the last 168 hours.   BNP Recent Labs  Lab 10/04/17 0226  BNP 46.8     DDimer No results for input(s): DDIMER in the last 168 hours.   Radiology    US Renal  Result Date: 10/08/2017 CLINICAL DATA:  Acute renal insufficiency EXAM: RENAL ULTRASOUND COMPARISON:  Abdominal ultrasound August 15, 2016 FINDINGS: Right Kidney: Length: 13.9 cm. Echogenicity and renal cortical thickness are within normal limits. No mass or perinephric fluid. There is slight fullness of the right renal collecting system. No sonographically demonstrable calculus or ureterectasis. Left Kidney: Length: 13.8 cm.  Echogenicity and renal cortical thickness are within normal limits. No mass or perinephric fluid. There is slight fullness of the left renal collecting system. No sonographically demonstrable calculus or ureterectasis. There is slight Bladder: Urinary bladder is decompressed with a Foley catheter. IMPRESSION: Slight fullness of each renal collecting system. No obstructing focus evident on either side. Kidneys otherwise appear unremarkable bilaterally. Urinary bladder decompressed with Foley catheter and cannot be assessed. Electronically Signed   By: Lowella Grip III M.D.   On: 10/08/2017 15:11   Dg Chest Port 1 View  Result Date: 10/09/2017 CLINICAL DATA:  Intubation. EXAM: PORTABLE CHEST 1 VIEW COMPARISON:  10/08/2017. FINDINGS: Endotracheal tube, NG tube, left IJ line stable position. Cardiomegaly with pulmonary vascular congestion. Mild bilateral interstitial prominence. Findings suggest mild CHF. No prominent pleural effusion or pneumothorax. Right costophrenic angle not imaged. IMPRESSION: 1.  Lines and tubes in stable position. 2. Cardiomegaly with pulmonary venous congestion and mild bilateral interstitial prominence noted on today's exam. Findings suggest mild CHF. Electronically Signed   By: Marcello Moores  Register   On: 10/09/2017 06:45   Dg Chest Port 1 View  Result Date: 10/08/2017 CLINICAL DATA:  Intubation. EXAM: PORTABLE CHEST 1 VIEW COMPARISON:  10/07/2017. FINDINGS: Endotracheal tube and NG tube in stable position. Stable cardiomegaly. Interim improvement of pulmonary venous congestion. Persistent basilar atelectasis. No pleural effusion or pneumothorax. IMPRESSION: 1.  Lines and tubes in stable position. 2. Stable cardiomegaly. Interim improvement of pulmonary venous congestion. 3.  Persistent bibasilar atelectasis. Electronically Signed   By: Marcello Moores  Register   On: 10/08/2017 06:51   Dg Chest Port 1 View  Result Date: 10/07/2017 CLINICAL DATA:  Hypoxia EXAM: PORTABLE CHEST 1 VIEW  COMPARISON:  Study obtained earlier in the day FINDINGS: Endotracheal tube tip is 7.2 cm above the carina. There is no edema jugular catheter with the tip in the superior vena cava just beyond the junction with the left innominate vein. Nasogastric tube tip and side port are below the diaphragm. No pneumothorax. There is mild bibasilar atelectasis. Lungs elsewhere clear. Heart is mildly enlarged with pulmonary venous hypertension. No adenopathy. No bone lesions. IMPRESSION: Tube and catheter positions as described without pneumothorax. Pulmonary vascular congestion without edema or consolidation. There is bibasilar atelectasis. Electronically Signed   By: Lowella Grip III M.D.   On: 10/07/2017 17:52    Cardiac Studies   Study Conclusions  - Left ventricle: The cavity size was normal. Wall thickness was normal. Systolic function was normal. The estimated ejection fraction was in the range of 60% to  65%. - Mitral valve: There was mild regurgitation.  Echo 07/22/17: Study Conclusions  - Procedure narrative: Transthoracic echocardiography. Image quality was poor. The study was technically difficult, as a result of poor acoustic windows, poor sound wave transmission, and body habitus. - Left ventricle: The cavity size was normal. Wall thickness was increased in a pattern of moderate LVH. Systolic function was normal. The estimated ejection fraction was in the range of 60% to 65%. Wall motion was normal; there were no regional wall motion abnormalities. Doppler parameters are consistent with abnormal left ventricular relaxation (grade 1 diastolic dysfunction). The E/e&' ratio is between 8-15, suggesting indeterminate LV filling pressure. - Mitral valve: Mildly thickened leaflets . There was mild regurgitation. - Left atrium: The atrium was normal in size. - Inferior vena cava: The vessel was dilated. The respirophasic diameter changes were blunted (<50%),  consistent with elevated central venous pressure.  Impressions:  - Technically difficult study. Compared to a prior study in 08/2015, the LVEF is unchanged at 60-65%. There is now grade 1 DD with indetermiante LV filling pressure.  CATH: NA  Patient Profile     52 y.o. male with a hx of significant for HIV on antivirals, hypertension, COPD with chronic hypoxia on home O2, DMII and newly diagnosed atrial fibrillation (07/2017) on Eliquis who is being seen today for the evaluation of atrial fibrillation with RVR at the request of Dr. Nelda Marseille.   Assessment & Plan    1.  Atrial fibrillation with rapid ventricular response: -converted to NSR on IV Amio -will continue on IV Amio for now until respiratory issues improved -continue on IV heparin gtt -CHA2DS2VASc =3  2.  Elevated troponin - trop mildly elevated with flat trend (0.03>0.03>0.04>0.05) likely related to afib with RVR -Will need ischemic evaluation given reports of chest pain at follow up visit on 09/27/17 with Dr. Harding>>>had been scheduled for OP stress without completion given present illness  3.  Chronic diastolic heart failure: -Echo completed 08/04/2018 with no significant changes from last echocardiogram 07/2017, normal EF with grade 1 diastolic dysfunction -he put out 1L yesterday and is net positive 3.8L since admission -Weight 273 pounds today. Admission weight 263 pounds consistent with last office visit 09/25/2017. -difficult to assess volume status given morbid obesity -renal feels he is dry and given NSR/LR  4.  Acute on chronic respiratory failure with hypoxia secondary to COPD/influenza A/occult pneumonia: -Per PCCM -On nebulizers, antibiotics -Heparin started for concern PE -currently getting trach  5.  DM 2: -SSI -Last hemoglobin A1c, 10.5 on 09/02/2017  6.  HIV: -Continue current regimen with antivirals -Per primary   For questions or updates, please contact [place] Please  consult [email or web address] for contact info under Cardiology/STEMI.      Signed, Fransico Him, MD  10/09/2017, 11:53 AM

## 2017-10-09 NOTE — Procedures (Signed)
Percutaneous Tracheostomy Placement  Consent from family.  Patient sedated, paralyzed and position.  Placed on 100% FiO2 and RR matched.  Area cleaned and draped.  Lidocaine/epi injected.  Skin incision done followed by blunt dissection.  Trachea palpated then punctured, catheter passed and visualized bronchoscopically.  Wire placed and visualized.  Catheter removed.  Airway then crushed and dilated.  Size 6 cuffed shiley trach placed and visualized bronchoscopically well above carina.  Good volume returns.  Patient tolerated the procedure well without complications.  Minimal blood loss.  CXR ordered and pending.  Wesam G. Yacoub, M.D. [facility]. Pager: [phone]. After hours pager: [phone]. 

## 2017-10-09 NOTE — Progress Notes (Signed)
Cortrak Tube Team Note:  Consult received to place a Cortrak feeding tube.   A 10 F Cortrak tube was placed in the R nare and secured with a nasal bridle at 85 cm. Per the Cortrak monitor reading the tube tip is post-pyloric.   No x-ray is required. RN may begin using tube.    If the tube becomes dislodged please keep the tube and contact the Cortrak team at [email or web address] ([email or web address]) for replacement.  If after hours and replacement cannot be delayed, place a NG tube and confirm placement with an abdominal x-ray.      Trenton GammonJessica Roxanne Orner, MS, RD, LDN, [facility] Inpatient Clinical Dietitian Pager # [phone] After hours/weekend pager # [phone]

## 2017-10-09 NOTE — Care Management Note (Signed)
Case Management Note  Patient Details  Name: Chris RiversJerome A Seawood Jr. MRN: [id number] Date of Birth: 04/11/1966  Subjective/Objective:  Pt admitted with SOB secondary to flu                Action/Plan:  PTA independent from home.     Expected Discharge Date:                  Expected Discharge Plan:  Skilled Nursing Facility  In-House Referral:  Clinical Social Work  Discharge planning Services  CM Consult  Post Acute Care Choice:    Choice offered to:     DME Arranged:    DME Agency:     HH Arranged:    HH Agency:     Status of Service:     If discussed at [employer] Meetings, dates discussed:    Additional Comments: 10/09/2017 Pt was unable to wean off ventilator - pt received trach 10/09/17.  CSW informed of pending SNF placement - [facility]. Cherylann ParrClaxton, Francene Mcerlean S, RN 10/09/2017, 3:40 PM

## 2017-10-09 NOTE — Procedures (Addendum)
Bronchoscopy Procedure Note Chris RiversJerome A Temkin Jr. [id number] 03/24/1966  Procedure: Bronchoscopy Indications: Percutaneous tracheostomy placement  Procedure Details Consent: Risks of procedure as well as the alternatives and risks of each were explained to the (patient/caregiver).  Consent for procedure obtained. Time Out: Verified patient identification, verified procedure, site/side was marked, verified correct patient position, special equipment/implants available, medications/allergies/relevent history reviewed, required imaging and test results available.  Performed  In preparation for procedure, patient was given 100% FiO2 and bronchoscope lubricated. Sedation: Benzodiazepines and Etomidate and rocuronium  Airway entered and the following bronchi were examined: Carina. Scope tip placed at carina and ETT backed out in 2 cm increments until it was 20cm at the lip. Scope retracted into ETT and observed tracheal cannulation with needle and wire placement. Posterior wall not damaged during this process. Observed dilation and trach placement. Removed scope from ETT and placed in tracheostomy. Carina observed. No significant bleeding.   Evaluation Hemodynamic Status: BP stable throughout; O2 sats: stable throughout Patient's Current Condition: stable Specimens:  None Complications: No apparent complications Patient did tolerate procedure well.  Chris [name] 10/09/2017  I was present and supervised the entire procedure.  Alyson ReedyWesam G. Yacoub, M.D. [facility]. Pager: [phone]. After hours pager: [phone].

## 2017-10-10 DIAGNOSIS — I4891 Unspecified atrial fibrillation: Secondary | ICD-10-CM

## 2017-10-10 DIAGNOSIS — N179 Acute kidney failure, unspecified: Secondary | ICD-10-CM

## 2017-10-10 DIAGNOSIS — N289 Disorder of kidney and ureter, unspecified: Secondary | ICD-10-CM

## 2017-10-10 DIAGNOSIS — Z21 Asymptomatic human immunodeficiency virus [HIV] infection status: Secondary | ICD-10-CM

## 2017-10-10 LAB — GLUCOSE, CAPILLARY
GLUCOSE-CAPILLARY: 174 mg/dL — AB (ref 65–99)
GLUCOSE-CAPILLARY: 177 mg/dL — AB (ref 65–99)
GLUCOSE-CAPILLARY: 200 mg/dL — AB (ref 65–99)
GLUCOSE-CAPILLARY: 227 mg/dL — AB (ref 65–99)
Glucose-Capillary: 217 mg/dL — ABNORMAL HIGH (ref 65–99)
Glucose-Capillary: 189 mg/dL — ABNORMAL HIGH (ref 65–99)

## 2017-10-10 LAB — CBC WITH DIFFERENTIAL/PLATELET
BAND NEUTROPHILS: 0 %
BASOS ABS: 0 10*3/uL (ref 0.0–0.1)
BASOS PCT: 0 %
Blasts: 0 %
EOS ABS: 0.1 10*3/uL (ref 0.0–0.7)
Eosinophils Relative: 1 %
HEMATOCRIT: 33.1 % — AB (ref 39.0–52.0)
HEMOGLOBIN: 9.8 g/dL — AB (ref 13.0–17.0)
Lymphocytes Relative: 9 %
Lymphs Abs: 0.9 10*3/uL (ref 0.7–4.0)
MCH: 27.6 pg (ref 26.0–34.0)
MCHC: 29.6 g/dL — ABNORMAL LOW (ref 30.0–36.0)
MCV: 93.2 fL (ref 78.0–100.0)
MYELOCYTES: 0 %
Metamyelocytes Relative: 0 %
Monocytes Absolute: 0.9 10*3/uL (ref 0.1–1.0)
Monocytes Relative: 9 %
Neutro Abs: 8.2 10*3/uL — ABNORMAL HIGH (ref 1.7–7.7)
Neutrophils Relative %: 81 %
PROMYELOCYTES ABS: 0 %
Platelets: 111 10*3/uL — ABNORMAL LOW (ref 150–400)
RBC: 3.55 MIL/uL — ABNORMAL LOW (ref 4.22–5.81)
RDW: 13.8 % (ref 11.5–15.5)
Smear Review: ADEQUATE
WBC: 10.1 10*3/uL (ref 4.0–10.5)
nRBC: 0 /100 WBC

## 2017-10-10 LAB — BASIC METABOLIC PANEL
Anion gap: 13 (ref 5–15)
BUN: 177 mg/dL — ABNORMAL HIGH (ref 6–20)
CHLORIDE: 108 mmol/L (ref 101–111)
CO2: 20 mmol/L — ABNORMAL LOW (ref 22–32)
CREATININE: 5.07 mg/dL — AB (ref 0.61–1.24)
Calcium: 7.9 mg/dL — ABNORMAL LOW (ref 8.9–10.3)
GFR calc Af Amer: 14 mL/min — ABNORMAL LOW (ref >60)
GFR calc non Af Amer: 12 mL/min — ABNORMAL LOW (ref >60)
GLUCOSE: 214 mg/dL — AB (ref 65–99)
Potassium: 5.4 mmol/L — ABNORMAL HIGH (ref 3.5–5.1)
SODIUM: 141 mmol/L (ref 135–145)

## 2017-10-10 LAB — BLOOD GAS, ARTERIAL
ACID-BASE DEFICIT: 3.9 mmol/L — AB (ref 0.0–2.0)
BICARBONATE: 21 mmol/L (ref 20.0–28.0)
Drawn by: 418751
FIO2: 50
LHR: 30 {breaths}/min
MECHVT: 600 mL
O2 Saturation: 89.8 %
PATIENT TEMPERATURE: 100.8
PEEP/CPAP: 10 cmH2O
PO2 ART: 67.4 mmHg — AB (ref 83.0–108.0)
pCO2 arterial: 43 mmHg (ref 32.0–48.0)
pH, Arterial: 7.317 — ABNORMAL LOW (ref 7.350–7.450)

## 2017-10-10 LAB — LEGIONELLA PNEUMOPHILA SEROGP 1 UR AG: L. PNEUMOPHILA SEROGP 1 UR AG: NEGATIVE

## 2017-10-10 LAB — MAGNESIUM: Magnesium: 3 mg/dL — ABNORMAL HIGH (ref 1.7–2.4)

## 2017-10-10 LAB — RENAL FUNCTION PANEL
ANION GAP: 12 (ref 5–15)
Albumin: 2.1 g/dL — ABNORMAL LOW (ref 3.5–5.0)
BUN: 161 mg/dL — ABNORMAL HIGH (ref 6–20)
CO2: 21 mmol/L — AB (ref 22–32)
CREATININE: 4.18 mg/dL — AB (ref 0.61–1.24)
Calcium: 8.3 mg/dL — ABNORMAL LOW (ref 8.9–10.3)
Chloride: 109 mmol/L (ref 101–111)
GFR calc non Af Amer: 15 mL/min — ABNORMAL LOW (ref >60)
GFR, EST AFRICAN AMERICAN: 18 mL/min — AB (ref >60)
Glucose, Bld: 214 mg/dL — ABNORMAL HIGH (ref 65–99)
Phosphorus: 5.5 mg/dL — ABNORMAL HIGH (ref 2.5–4.6)
Potassium: 5.3 mmol/L — ABNORMAL HIGH (ref 3.5–5.1)
Sodium: 142 mmol/L (ref 135–145)

## 2017-10-10 LAB — HEPARIN LEVEL (UNFRACTIONATED): HEPARIN UNFRACTIONATED: 0.34 [IU]/mL (ref 0.30–0.70)

## 2017-10-10 LAB — TRIGLYCERIDES: TRIGLYCERIDES: 205 mg/dL — AB (ref <150)

## 2017-10-10 MED ORDER — SODIUM BICARBONATE 650 MG PO TABS
1300.0000 mg | ORAL_TABLET | Freq: Three times a day (TID) | ORAL | Status: DC
  Administered 2017-10-10 – 2017-10-11 (×6): 1300 mg via ORAL
  Filled 2017-10-10 (×7): qty 2

## 2017-10-10 MED ORDER — TENOFOVIR DISOPROXIL FUMARATE 300 MG PO TABS
300.0000 mg | ORAL_TABLET | ORAL | Status: DC
  Administered 2017-10-14: 300 mg via ORAL
  Filled 2017-10-10 (×2): qty 1

## 2017-10-10 MED ORDER — DARUNAVIR ETHANOLATE 800 MG PO TABS: 800.0000 mg | ORAL_TABLET | Freq: Every day | ORAL | Status: DC

## 2017-10-10 MED ORDER — FAMOTIDINE 40 MG/5ML PO SUSR
20.0000 mg | Freq: Every day | ORAL | Status: DC
  Administered 2017-10-11 – 2017-10-29 (×19): 20 mg
  Filled 2017-10-10 (×20): qty 2.5

## 2017-10-10 MED ORDER — RITONAVIR 100 MG PO TABS: 100.0000 mg | ORAL_TABLET | Freq: Every day | ORAL | Status: DC

## 2017-10-10 MED ORDER — SODIUM POLYSTYRENE SULFONATE PO POWD
30.0000 g | Freq: Once | ORAL | Status: AC
  Administered 2017-10-10: 30 g
  Filled 2017-10-10: qty 30

## 2017-10-10 MED ORDER — EMTRICITABINE 200 MG PO CAPS
200.0000 mg | ORAL_CAPSULE | ORAL | Status: DC
  Administered 2017-10-12: 200 mg via ORAL
  Filled 2017-10-10: qty 1

## 2017-10-10 NOTE — Progress Notes (Signed)
Discussed with pharm- Will change his descovy to components due to his renal dysfunction.  Alternatively, could change to darunavir-norvir-tivicay to spare tenofovir altogether (he has prev tolerated DRV after dx with sulfa allergy).  Appreciate pharm help with dosing.

## 2017-10-10 NOTE — Progress Notes (Signed)
ANTICOAGULATION CONSULT NOTE - Follow Up Consult  Pharmacy Consult for heparin Indication: pulmonary embolus/Afib  Allergies  Allergen Reactions  . Bactrim [Sulfamethoxazole-Trimethoprim] Hives    Patient Measurements: Height: 5\' 9"  (175.3 cm) Weight: 278 lb (126.1 kg) IBW/kg (Calculated) : 70.7 Heparin Dosing Weight: 97.7 kg  Vital Signs: Temp: 99.5 F (37.5 C) (03/02 0800) Temp Source: Bladder (03/02 0500) BP: 168/58 (03/02 0800) Pulse Rate: 57 (03/02 0800)  Labs: Recent Labs    10/08/17 0440  10/08/17 1622 10/08/17 2140 10/09/17 0355 10/09/17 1247 10/09/17 2000 10/10/17 0211 10/10/17 0513  HGB 11.3*  --   --   --  10.4*  --   --  9.8*  --   HCT 39.2  --   --   --  34.9*  --   --  33.1*  --   PLT 139*  --   --   --  126*  --   --  111*  --   APTT 41*   < >  --  52* 60* 66* 68*  --   --   LABPROT  --   --   --   --  15.1  --   --   --   --   INR  --   --   --   --  1.20  --   --   --   --   HEPARINUNFRC 0.37  --   --   --  0.47  --  0.45  --  0.34  CREATININE 2.31*  --   --   --  3.11*  --   --  4.18*  --   TROPONINI  --    < > 0.03* 0.04* 0.05*  --   --   --   --    < > = values in this interval not displayed.    Estimated Creatinine Clearance: 27.5 mL/min (A) (by C-G formula based on SCr of 4.18 mg/dL (H)).   Assessment: [age] YOM with history of Afib on Eliquis at home, last dose given 2/25 @ 2100. New concern for PE this admission so switched to heparin. APTT this afternoon was 66, just barely within range.  Heparin level this morning remains therapeutic (HL 0.34 << 0.45, goal of 0.3-0.7). Hgb and platelet slow trend down -will watch. Per RN no bleeding around newly placed trach or blood with suctioning, no overt bleeding noted.   Goal of Therapy:  Heparin level 0.3-0.7 units/ml Monitor platelets by anticoagulation protocol: Yes   Plan:  Continue heparin  2450 units/hr Daily heparin level/CBC Monitor s/sx of bleeding  Thank you for allowing pharmacy to  be a part of this patient's care.  Georgina PillionElizabeth Daltyn Degroat, PharmD, BCPS Clinical Pharmacist Pager: [phone] Clinical phone for 10/10/2017 from 7a-3:30p: [phone] If after 3:30p, please call main pharmacy at: [phone] 10/10/2017 11:13 AM

## 2017-10-10 NOTE — Progress Notes (Signed)
eLink Physician-Brief Progress Note Patient Name: Chris [name]. DOB: 02/02/1966 MRN: [id number]  Lab reviuew   Recent Labs  Lab 10/06/17 0521 10/07/17 0318 10/07/17 2033 10/08/17 0440 10/09/17 0355 10/10/17 0211  NA 150* 150* 154* 155* 145 142  K 4.5 5.9* 4.3 4.1 4.6 5.3*  CL 115* 117* 121* 121* 112* 109  CO2 24 23 24 25  21* 21*  GLUCOSE 242* 420* 150* 62* 227* 214*  BUN 78* 92* 113* 126* 150* 161*  CREATININE 1.37* 1.51* 1.65* 2.31* 3.11* 4.18*  CALCIUM 9.8 9.7 9.2 9.5 8.7* 8.3*  MG 3.1* 3.0*  --  3.4* 2.9* 3.0*  PHOS 3.0 6.4*  --  3.4 3.9 5.5*   Hihg norml K Renal fiailure +  Plan Kayexalate x 1   Dr. Kalman ShanMurali Carrin Vannostrand, M.D., F.C.C.P Pulmonary and Critical Care Medicine Staff Physician, [facility] Director - Interstitial Lung Disease  Program  [facility] [place], [place], [id number]  Pager: [phone], If no answer or between  15:00h - 7:00h: call [phone] Telephone: [phone]

## 2017-10-10 NOTE — Progress Notes (Signed)
Patient in sinus rhythm with multiple APCs. Remains intubated. Would continue IV amiodarone over the weekend. Cardiology to see over the weekend as needed.

## 2017-10-10 NOTE — Progress Notes (Signed)
Subjective: Interval History: not responsive this am.  Objective: Vital signs in last 24 hours: Temp:  [99.5 F (37.5 C)-101.3 F (38.5 C)] 99.7 F (37.6 C) (03/02 0630) Pulse Rate:  [52-119] 55 (03/02 0630) Resp:  [27-32] 28 (03/02 0400) BP: (133-198)/(51-117) 171/66 (03/02 0630) SpO2:  [89 %-98 %] 90 % (03/02 0630) FiO2 (%):  [40 %-50 %] 50 % (03/02 0500) Weight:  [126.1 kg (278 lb)] 126.1 kg (278 lb) (03/02 0415) Weight change: 3.7 kg (8 lb 2.5 oz)  Intake/Output from previous day: 03/01 0701 - 03/02 0700 In: 5639.5 [I.V.:3509.5; NG/GT:2030; IV Piggyback:100] Out: 1070 [Urine:1070] Intake/Output this shift: No intake/output data recorded.  General appearance: obese, no response, trach Neck: trach Resp: rales bibasilar and rhonchi bilaterally Cardio: regular rate and rhythm, S1, S2 normal and systolic murmur: holosystolic 2/6, blowing at 2nd left intercostal space GI: soft, non-tender; bowel sounds normal; no masses,  no organomegaly and pos bs, mid distension,  Extremities: edema 2+  Lab Results: Recent Labs    10/09/17 0355 10/10/17 0211  WBC 10.0 10.1  HGB 10.4* 9.8*  HCT 34.9* 33.1*  PLT 126* 111*   BMET:  Recent Labs    10/09/17 0355 10/10/17 0211  NA 145 142  K 4.6 5.3*  CL 112* 109  CO2 21* 21*  GLUCOSE 227* 214*  BUN 150* 161*  CREATININE 3.11* 4.18*  CALCIUM 8.7* 8.3*   No results for input(s): PTH in the last 72 hours. Iron Studies: No results for input(s): IRON, TIBC, TRANSFERRIN, FERRITIN in the last 72 hours.  Studies/Results: US Renal  Result Date: 10/08/2017 CLINICAL DATA:  Acute renal insufficiency EXAM: RENAL ULTRASOUND COMPARISON:  Abdominal ultrasound August 15, 2016 FINDINGS: Right Kidney: Length: 13.9 cm. Echogenicity and renal cortical thickness are within normal limits. No mass or perinephric fluid. There is slight fullness of the right renal collecting system. No sonographically demonstrable calculus or ureterectasis. Left Kidney:  Length: 13.8 cm. Echogenicity and renal cortical thickness are within normal limits. No mass or perinephric fluid. There is slight fullness of the left renal collecting system. No sonographically demonstrable calculus or ureterectasis. There is slight Bladder: Urinary bladder is decompressed with a Foley catheter. IMPRESSION: Slight fullness of each renal collecting system. No obstructing focus evident on either side. Kidneys otherwise appear unremarkable bilaterally. Urinary bladder decompressed with Foley catheter and cannot be assessed. Electronically Signed   By: Bretta Bang III M.D.   On: 10/08/2017 15:11   Dg Chest Port 1 View  Result Date: 10/09/2017 CLINICAL DATA:  Tracheostomy placement EXAM: PORTABLE CHEST 1 VIEW COMPARISON:  Study obtained earlier in the day FINDINGS: There is now a tracheostomy present with the tracheostomy tip 7.8 cm above the carina. Left jugular catheter tip is in the left innominate vein near the junction with the superior vena cava. No pneumothorax. A portion of the left base is not seen. There is atelectatic change in the right base. Visualized lungs elsewhere clear. There is cardiomegaly with mild pulmonary venous hypertension. IMPRESSION: 1. Tube and catheter positions as described without pneumothorax evident. 2. A small portion of the left base is not visualized. There is atelectatic change in the right base. Visualized lungs elsewhere clear. 3.  There is a degree of pulmonary vascular congestion. Electronically Signed   By: Bretta Bang III M.D.   On: 10/09/2017 12:58   Dg Chest Port 1 View  Result Date: 10/09/2017 CLINICAL DATA:  Intubation. EXAM: PORTABLE CHEST 1 VIEW COMPARISON:  10/08/2017. FINDINGS: Endotracheal tube, NG  tube, left IJ line stable position. Cardiomegaly with pulmonary vascular congestion. Mild bilateral interstitial prominence. Findings suggest mild CHF. No prominent pleural effusion or pneumothorax. Right costophrenic angle not imaged.  IMPRESSION: 1.  Lines and tubes in stable position. 2. Cardiomegaly with pulmonary venous congestion and mild bilateral interstitial prominence noted on today's exam. Findings suggest mild CHF. Electronically Signed   By: Maisie Fushomas  Register   On: 10/09/2017 06:45    I have reviewed the patient's current medications.  Assessment/Plan: 1 AKI nonoliguric ATN from hypoperfusion ,? Some toxic effect.  Vol xs now , stop ivf.  K mild ^, does not need tx.  Will add bicarb 2 Afib in NSR 3 Resp failure on vent  4 HIV 5 CM 6 COPD 7 Nutrition TF P stop ivf, start Na bicarb. Vent .      LOS: 9 days   Fayrene FearingJames Cristel Rail 10/10/2017,7:22 AM

## 2017-10-10 NOTE — Progress Notes (Addendum)
PULMONARY / CRITICAL CARE MEDICINE   Name: Chris [name]. MRN: [id number] DOB: 1965/10/25    ADMISSION DATE:  10/01/2017 CONSULTATION DATE:  2/21  REFERRING MD:  Juleen China  CHIEF COMPLAINT:  SOB  HISTORY OF PRESENT ILLNESS:   Chris [name]. is a 52 y.o. male with medical history significant for HIV disease on Biktarvy, atrial fibrillation on Eliquis, COPD on prn nasal cannula oxygen, and diabetes on oral agent.  Patient is also obese.  He reports that for 3 days he has had rhinorrhea, generalized aching, cough with productive white sputum.  He has utilized his home MDIs without relief.  He forgot to use his nebulizers.  He has not had any nausea, vomiting or diarrhea but has not had much appetite.  He felt hot but stated he did not take his temperature.  He also increased his oxygen to 3 L/min from a baseline of 2 L but did not have any improvement in his difficulty breathing.  After arrival to the ER he has required stairstep increases in his oxygen due to persistent hypoxemia and currently is on high flow nasal cannula at 15 L/min.  He is still tachypneic and tachycardic.  He was found to be febrile with an oral temperature of 101.9 F.  Influenza PCR pending.  Chest x-ray without focal infiltrate.  He has been treated presumptively as COPD exacerbation with possible bronchitis and has been given a dose of oral prednisone.  Levaquin was also ordered but given patient's recent hospitalization/discharge on 07/28/17 I have opted to utilize broad-spectrum empiric antibiotic coverage.  Patient's last CD4 count was 430 on 2/15 with a viral load of 22. Because of increased work of breathing and persistent tachycardia/tachypnea patient was placed on bipap and admitted to ICU, intubated shortly after.  Subjective:  Heavily sedated and unresponsive does have cough reflex   VITAL SIGNS: BP (!) 168/58   Pulse (!) 57   Temp 99.5 F (37.5 C)   Resp (!) 28   Ht 5\' 9"  (1.753 m)   Wt 278 lb (126.1 kg)    SpO2 91%   BMI 41.05 kg/m    HEMODYNAMICS: CVP:  [14 mmHg] 14 mmHg  VENTILATOR SETTINGS: Vent Mode: PRVC FiO2 (%):  [40 %-60 %] 60 % Set Rate:  [30 bmp] 30 bmp Vt Set:  [600 mL] 600 mL PEEP:  [10 cmH20] 10 cmH20 Plateau Pressure:  [26 cmH20-32 cmH20] 28 cmH20  INTAKE / OUTPUT:  Intake/Output Summary (Last 24 hours) at 10/10/2017 0831 Last data filed at 10/10/2017 0800 Gross per 24 hour  Intake 5758.68 ml  Output 1060 ml  Net 4698.68 ml   Filed Weights   10/08/17 1700 10/09/17 0413 10/10/17 0415  Weight: 269 lb 13.5 oz (122.4 kg) 273 lb 9.5 oz (124.1 kg) 278 lb (126.1 kg)   PHYSICAL EXAMINATION: Subjective: 52 year old acutely ill appearing male he is currently obtunded and unresponsive on ventilator HEENT he has a #6 trach there is no significant discharge otherwise normocephalic mucous membranes moist. Pulmonary: Diffuse rhonchi, faint distant expiratory wheeze cardiac: Tachycardic regular rate and rhythm he is hypertensive Extremities/musculoskeletal: Massively volume overloaded with diffuse anasarca warm, strong pulses. Abdomen: Distended soft positive bowel sounds liquid stool from Flexi-Seal GU: Minimal urine output Neuro: Heavily sedated he has a cough reflex, otherwise not moving LABS:  BMET Recent Labs  Lab 10/08/17 0440 10/09/17 0355 10/10/17 0211  NA 155* 145 142  K 4.1 4.6 5.3*  CL 121* 112* 109  CO2 25 21*  21*  BUN 126* 150* 161*  CREATININE 2.31* 3.11* 4.18*  GLUCOSE 62* 227* 214*   Electrolytes Recent Labs  Lab 10/08/17 0440 10/09/17 0355 10/10/17 0211  CALCIUM 9.5 8.7* 8.3*  MG 3.4* 2.9* 3.0*  PHOS 3.4 3.9 5.5*   CBC Recent Labs  Lab 10/08/17 0440 10/09/17 0355 10/10/17 0211  WBC 8.6 10.0 10.1  HGB 11.3* 10.4* 9.8*  HCT 39.2 34.9* 33.1*  PLT 139* 126* 111*   Coag's Recent Labs  Lab 10/09/17 0355 10/09/17 1247 10/09/17 2000  APTT 60* 66* 68*  INR 1.20  --   --     Sepsis Markers Recent Labs  Lab 10/07/17 0318   PROCALCITON 0.24    ABG Recent Labs  Lab 10/08/17 0309 10/09/17 0314 10/10/17 0335  PHART 7.383 7.319* 7.317*  PCO2ART 42.5 47.0 43.0  PO2ART 77.7* 62.0* 67.4*    Liver Enzymes Recent Labs  Lab 10/09/17 0355 10/10/17 0211  ALBUMIN 2.3* 2.1*    Cardiac Enzymes Recent Labs  Lab 10/08/17 1622 10/08/17 2140 10/09/17 0355  TROPONINI 0.03* 0.04* 0.05*    Glucose Recent Labs  Lab 10/09/17 0745 10/09/17 1152 10/09/17 1551 10/09/17 1951 10/09/17 2344 10/10/17 0350  GLUCAP 188* 149* 159* 150* 189* 200*    Imaging Dg Chest Port 1 View  Result Date: 10/09/2017 CLINICAL DATA:  Tracheostomy placement EXAM: PORTABLE CHEST 1 VIEW COMPARISON:  Study obtained earlier in the day FINDINGS: There is now a tracheostomy present with the tracheostomy tip 7.8 cm above the carina. Left jugular catheter tip is in the left innominate vein near the junction with the superior vena cava. No pneumothorax. A portion of the left base is not seen. There is atelectatic change in the right base. Visualized lungs elsewhere clear. There is cardiomegaly with mild pulmonary venous hypertension. IMPRESSION: 1. Tube and catheter positions as described without pneumothorax evident. 2. A small portion of the left base is not visualized. There is atelectatic change in the right base. Visualized lungs elsewhere clear. 3.  There is a degree of pulmonary vascular congestion. Electronically Signed   By: Bretta Bang III M.D.   On: 10/09/2017 12:58    STUDIES:  CXR 2/21 >> no frank edema or consolidation CXR 2/22>> Endotracheal tube above the carina. Enteric tube extends into the left upper abdomen with tip and side-port likely in the proximal to mid stomach. Cardiomegaly with bibasilar atelectatic changes. CXR 2/23 >>Increased atelectasis at the right lung base CXR 2/24>> Persistent patchy basilar infiltrates R>L TTE 2/25 > Left ventricle: The cavity size was normal. Wall thickness was normal. Systolic  function was normal. The estimated ejection   fraction was in the range of 60% to 65%. - Mitral valve: There was mild regurgitation   KUB 2/26 > ileus vs enteritis, less likely SBO  CULTURES: RVP >Flu A positive Tracheal aspirate 2/22 > rare G+ cocci and rods- reincubated > NRF Blood cx 2/21 > NGx5 days Trach asp 2/26 > normal flora Ucx 2/26 >> no growth Blood cx 26 >> PENDING  ANTIBIOTICS: Vancomycin 2/21 >>2/22 Cefepime 2/21 >> 2/28 Tamiflu 2/21 >> 2/26 Fluconazole 2/21 >> 2/27 Anidulafungin 2/27 >> Azithro 2/27 >> 3/1  SIGNIFICANT EVENTS: Admitted 2/21, intubated  LINES/TUBES: ETT 2/22 >> 3/1 OGT 2/22 >>3/1 PIV >> Foley 2/22 >> Left IJ 2/27 >>>  DISCUSSION: 51yom with COPD on PRN oxygen presented with ~2 day h/o SOB. Now with marked respiratory distress, remains SOB on BiPAP. Intubated. Influenza positive, occult pneumonia; superimposed on COPD exacerbation. Tamiflu,  broad-spectrum abx, steroids, nebs. Fungal coverage for thrush seen during intubation. Has been constipated requiring extensive bowel regimen to have BM. Afib uncontrolled, amio drip started.   ASSESSMENT / PLAN:   Acute on chronic respiratory failure w/ hypoxia ARDS Flu > improving, however, remains hypoxemic.  COPD Possible PE -Completed Tamiflu and antimicrobials -Tracheostomy in satisfactory position.  Left IJ central line satisfactory.  Bibasilar airspace disease stable this was from 3/1 evaluation of chest x-ray Plan Continue full ventilator support weaning PEEP/FiO2 Continue Brovana, pulmicort nebs continue heparin drip given concern for possible pulmonary embolus Correct acid-base imbalance Should conside proximal XLT trach  History atrial fibrillation, currently in afib w/ RVR HTN Plan Continue amiodarone infusion Holding Eliquis, continuing heparin infusion Continuing home diltiazem As needed metoprolol Telemetry monitoring  AKI - worsening, nephrology feels this is secondary to  hypoperfusion vs toxic with early ATN.  Metabolic acidosis - likely dt worsening renal failure Plan Bicarbonate infusion per nephrology Stopping additional IV fluids Follow-up a.m. chemistry Defer decision for dialysis to nephrology Renal dose medications Strict intake and output  Fluid and electrolyte imbalance: Hyperkalemia Plan Received Kayexalate x1, will repeat chemistry Suspect bicarbonate infusion may help this Follow-up a.m. labs   Obesity Constipation- large volume BM 2/27 Plan Continue tube feeds Continue bowel protocol   Anemia and thrombocytopenia.  No current evidence of bleeding but is on heparin infusion currently.  At baseline on NOAC Plan Trend CBC Transfuse per protocol  HIV last CD4 >400, viral load 22.  esophageal candidiasis noted during intubation. Plan Infectious disease following Follow-up urinary Legionella Continue antivirals Eraxis per infectious disease, planning 7-day course which will end 3/6     Type 2 DM  - Last A1C 10.5 Plan   Cont resistant scale ssi Continuing Lantus 30 units twice daily     FAMILY  - Updates: updated wife at bedside 3/1 - Inter-disciplinary family meet or Palliative Care meeting due by: 3/1  DVT prophylaxis: IV heparin SUP: H2B Diet: Tube feeds Activity: Bedrest Disposition : ICU  10/10/2017 8:20 AM Simonne MartinetPeter E Babcock ACNP-BC [facility] Pulmonary/Critical Care Pager # [phone] OR # [phone] if no answer'  Attending Note:  52 year old male with HIV history presenting with flu A and respiratory failure, ARDS and septic shock.  On exam, coarse BS diffusely.  I reviewed CXR myself, ETT is in good position.  Spoke with renal.  Will hold off dialysis for now.  Will continue aggressive fluid resuscitation.  Kayexalate for hyperK.  Continue abx.  Increase PEEP and FiO2 for sat.  Will hold off weaning for today.  The patient is critically ill with multiple organ systems failure and requires high complexity decision  making for assessment and support, frequent evaluation and titration of therapies, application of advanced monitoring technologies and extensive interpretation of multiple databases.   Critical Care Time devoted to patient care services described in this note is  35  Minutes. This time reflects time of care of this signee Dr Koren BoundWesam Delaney Perona. This critical care time does not reflect procedure time, or teaching time or supervisory time of PA/NP/Med student/Med Resident etc but could involve care discussion time.  Alyson ReedyWesam G. Marybella Ethier, M.D. [facility] Pulmonary/Critical Care Medicine. Pager: [phone]. After hours pager: [phone].

## 2017-10-11 ENCOUNTER — Inpatient Hospital Stay (HOSPITAL_COMMUNITY): Payer: Medicare HMO

## 2017-10-11 LAB — POCT I-STAT 3, ART BLOOD GAS (G3+)
Acid-base deficit: 3 mmol/L — ABNORMAL HIGH (ref 0.0–2.0)
Acid-base deficit: 2 mmol/L (ref 0.0–2.0)
Bicarbonate: 23.6 mmol/L (ref 20.0–28.0)
Bicarbonate: 24.3 mmol/L (ref 20.0–28.0)
O2 Saturation: 90 %
O2 Saturation: 91 %
PCO2 ART: 48.6 mmHg — AB (ref 32.0–48.0)
PCO2 ART: 51.3 mmHg — AB (ref 32.0–48.0)
PH ART: 7.275 — AB (ref 7.350–7.450)
PH ART: 7.309 — AB (ref 7.350–7.450)
PO2 ART: 68 mmHg — AB (ref 83.0–108.0)
Patient temperature: 99.9
Patient temperature: 99.3
TCO2: 25 mmol/L (ref 22–32)
TCO2: 26 mmol/L (ref 22–32)
pO2, Arterial: 70 mmHg — ABNORMAL LOW (ref 83.0–108.0)

## 2017-10-11 LAB — RENAL FUNCTION PANEL
ALBUMIN: 2 g/dL — AB (ref 3.5–5.0)
ANION GAP: 14 (ref 5–15)
BUN: 188 mg/dL — ABNORMAL HIGH (ref 6–20)
CHLORIDE: 105 mmol/L (ref 101–111)
CO2: 21 mmol/L — ABNORMAL LOW (ref 22–32)
Calcium: 8 mg/dL — ABNORMAL LOW (ref 8.9–10.3)
Creatinine, Ser: 5.77 mg/dL — ABNORMAL HIGH (ref 0.61–1.24)
GFR, EST AFRICAN AMERICAN: 12 mL/min — AB (ref >60)
GFR, EST NON AFRICAN AMERICAN: 10 mL/min — AB (ref >60)
Glucose, Bld: 241 mg/dL — ABNORMAL HIGH (ref 65–99)
PHOSPHORUS: 8.4 mg/dL — AB (ref 2.5–4.6)
POTASSIUM: 5.6 mmol/L — AB (ref 3.5–5.1)
Sodium: 140 mmol/L (ref 135–145)

## 2017-10-11 LAB — HEPARIN LEVEL (UNFRACTIONATED)
HEPARIN UNFRACTIONATED: 0.15 [IU]/mL — AB (ref 0.30–0.70)
Heparin Unfractionated: 0.14 IU/mL — ABNORMAL LOW (ref 0.30–0.70)

## 2017-10-11 LAB — CBC WITH DIFFERENTIAL/PLATELET
BAND NEUTROPHILS: 0 %
BASOS ABS: 0 10*3/uL (ref 0.0–0.1)
BLASTS: 0 %
Basophils Relative: 0 %
EOS ABS: 0.1 10*3/uL (ref 0.0–0.7)
Eosinophils Relative: 1 %
HEMATOCRIT: 30.6 % — AB (ref 39.0–52.0)
HEMOGLOBIN: 9.4 g/dL — AB (ref 13.0–17.0)
LYMPHS PCT: 8 %
Lymphs Abs: 0.9 10*3/uL (ref 0.7–4.0)
MCH: 28.5 pg (ref 26.0–34.0)
MCHC: 30.7 g/dL (ref 30.0–36.0)
MCV: 92.7 fL (ref 78.0–100.0)
METAMYELOCYTES PCT: 0 %
MONOS PCT: 12 %
Monocytes Absolute: 1.3 10*3/uL — ABNORMAL HIGH (ref 0.1–1.0)
Myelocytes: 0 %
NEUTROS ABS: 8.5 10*3/uL — AB (ref 1.7–7.7)
Neutrophils Relative %: 79 %
Other: 0 %
Platelets: 150 10*3/uL (ref 150–400)
Promyelocytes Absolute: 0 %
RBC: 3.3 MIL/uL — ABNORMAL LOW (ref 4.22–5.81)
RDW: 14.3 % (ref 11.5–15.5)
WBC: 10.8 10*3/uL — ABNORMAL HIGH (ref 4.0–10.5)
nRBC: 0 /100 WBC

## 2017-10-11 LAB — GLUCOSE, CAPILLARY
GLUCOSE-CAPILLARY: 211 mg/dL — AB (ref 65–99)
Glucose-Capillary: 200 mg/dL — ABNORMAL HIGH (ref 65–99)
Glucose-Capillary: 183 mg/dL — ABNORMAL HIGH (ref 65–99)
Glucose-Capillary: 183 mg/dL — ABNORMAL HIGH (ref 65–99)
Glucose-Capillary: 219 mg/dL — ABNORMAL HIGH (ref 65–99)

## 2017-10-11 LAB — MAGNESIUM: MAGNESIUM: 3.4 mg/dL — AB (ref 1.7–2.4)

## 2017-10-11 MED ORDER — PENTAFLUOROPROP-TETRAFLUOROETH EX AERO: 1.0000 "application " | INHALATION_SPRAY | CUTANEOUS | Status: DC | PRN

## 2017-10-11 MED ORDER — IPRATROPIUM-ALBUTEROL 0.5-2.5 (3) MG/3ML IN SOLN
3.0000 mL | RESPIRATORY_TRACT | Status: DC
  Administered 2017-10-11 – 2017-10-13 (×12): 3 mL via RESPIRATORY_TRACT
  Filled 2017-10-11 (×12): qty 3

## 2017-10-11 MED ORDER — AMIODARONE HCL 200 MG PO TABS
200.0000 mg | ORAL_TABLET | Freq: Every day | ORAL | Status: DC
  Administered 2017-10-18 – 2017-10-30 (×13): 200 mg
  Filled 2017-10-11 (×13): qty 1

## 2017-10-11 MED ORDER — AMIODARONE HCL 200 MG PO TABS
200.0000 mg | ORAL_TABLET | Freq: Two times a day (BID) | ORAL | Status: AC
  Administered 2017-10-11 – 2017-10-17 (×12): 200 mg
  Filled 2017-10-11 (×14): qty 1

## 2017-10-11 MED ORDER — METHYLPREDNISOLONE SODIUM SUCC 40 MG IJ SOLR
40.0000 mg | Freq: Two times a day (BID) | INTRAMUSCULAR | Status: DC
  Administered 2017-10-11 – 2017-10-13 (×5): 40 mg via INTRAVENOUS
  Filled 2017-10-11 (×5): qty 1

## 2017-10-11 MED ORDER — HEPARIN SODIUM (PORCINE) 1000 UNIT/ML DIALYSIS: 1000.0000 [IU] | INTRAMUSCULAR | Status: DC | PRN

## 2017-10-11 MED ORDER — LIDOCAINE-PRILOCAINE 2.5-2.5 % EX CREA
1.0000 "application " | TOPICAL_CREAM | CUTANEOUS | Status: DC | PRN
  Filled 2017-10-11: qty 5

## 2017-10-11 MED ORDER — INSULIN ASPART 100 UNIT/ML ~~LOC~~ SOLN
6.0000 [IU] | SUBCUTANEOUS | Status: DC
  Administered 2017-10-11 – 2017-10-12 (×6): 6 [IU] via SUBCUTANEOUS

## 2017-10-11 MED ORDER — LIDOCAINE HCL (PF) 1 % IJ SOLN: 5.0000 mL | INTRAMUSCULAR | Status: DC | PRN

## 2017-10-11 MED ORDER — HEPARIN SODIUM (PORCINE) 1000 UNIT/ML DIALYSIS
40.0000 [IU]/kg | Freq: Once | INTRAMUSCULAR | Status: AC
  Administered 2017-10-11: 5100 [IU] via INTRAVENOUS_CENTRAL
  Filled 2017-10-11: qty 6

## 2017-10-11 MED ORDER — ALTEPLASE 2 MG IJ SOLR
2.0000 mg | Freq: Once | INTRAMUSCULAR | Status: DC | PRN
  Filled 2017-10-11 (×2): qty 2

## 2017-10-11 MED ORDER — SODIUM CHLORIDE 0.9 % IV SOLN: 100.0000 mL | INTRAVENOUS | Status: DC | PRN

## 2017-10-11 MED ORDER — HEPARIN (PORCINE) IN NACL 100-0.45 UNIT/ML-% IJ SOLN
2850.0000 [IU]/h | INTRAMUSCULAR | Status: DC
  Administered 2017-10-12: 2900 [IU]/h via INTRAVENOUS
  Administered 2017-10-12: 2650 [IU]/h via INTRAVENOUS
  Administered 2017-10-12 – 2017-10-14 (×5): 2900 [IU]/h via INTRAVENOUS
  Administered 2017-10-14 (×2): 2800 [IU]/h via INTRAVENOUS
  Administered 2017-10-15 – 2017-10-18 (×8): 2850 [IU]/h via INTRAVENOUS
  Filled 2017-10-11 (×34): qty 250

## 2017-10-11 NOTE — Progress Notes (Addendum)
ANTICOAGULATION CONSULT NOTE - Follow Up Consult  Pharmacy Consult for heparin Indication: pulmonary embolus/Afib  Allergies  Allergen Reactions  . Bactrim [Sulfamethoxazole-Trimethoprim] Hives    Patient Measurements: Height: 5\' 9"  (175.3 cm) Weight: 281 lb 4.9 oz (127.6 kg) IBW/kg (Calculated) : 70.7 Heparin Dosing Weight: 97.7 kg  Vital Signs: Temp: 99.3 F (37.4 C) (03/03 2230) BP: 196/69 (03/03 2230) Pulse Rate: 98 (03/03 2230)  Labs: Recent Labs    10/09/17 0355 10/09/17 1247 10/09/17 2000 10/10/17 0211 10/10/17 0513 10/10/17 1856 10/11/17 0434 10/11/17 2208  HGB 10.4*  --   --  9.8*  --   --  9.4*  --   HCT 34.9*  --   --  33.1*  --   --  30.6*  --   PLT 126*  --   --  111*  --   --  150  --   APTT 60* 66* 68*  --   --   --   --   --   LABPROT 15.1  --   --   --   --   --   --   --   INR 1.20  --   --   --   --   --   --   --   HEPARINUNFRC 0.47  --  0.45  --  0.34  --  0.15* 0.14*  CREATININE 3.11*  --   --  4.18*  --  5.07* 5.77*  --   TROPONINI 0.05*  --   --   --   --   --   --   --     Estimated Creatinine Clearance: 20 mL/min (A) (by C-G formula based on SCr of 5.77 mg/dL (H)).   Assessment: [age] YOM with history of Afib on Eliquis at home, last dose given 2/25 @ 2100. New concern for PE this admission so switched to heparin. APTT this afternoon was 66, just barely within range.  Heparin rate was increased this morning due to a low heparin level. The drip was held mid-morning in preparation for HD cath placement. Per CCM - okay to resume Heparin at 1400 today, will re-time level for later this evening to check on new drip rate.    Heparin level still low last evening on recheck. Nurse reports some concern from dayshift over IV leaking and patient not receiving all of the medication. Night nurse states IV site looks good with good blood return so will continue at current rate and recheck with am labs.   Heparin level below goal this morning after  leaving unchanged overnight. No issues noted this am.   Goal of Therapy:  Heparin level 0.3-0.7 units/ml Monitor platelets by anticoagulation protocol: Yes   Plan:  Continue heparin at 2900 units/hr and recheck level this afternoon Continue to monitor for any signs/symptoms of bleeding  Thank you for allowing pharmacy to be a part of this patient's care.  Sheppard CoilFrank Donnamae Muilenburg PharmD., BCPS Clinical Pharmacist 10/11/2017 11:02 PM

## 2017-10-11 NOTE — Procedures (Signed)
I was present at this session.  I have reviewed the session itself and made appropriate changes.  HD via temp cath, RIJ. Tol well so far, going for 2.5 L. bp ok  HR about 100  Chris LatheJames Dencil Cayson 3/3/20191:56 PM

## 2017-10-11 NOTE — Procedures (Signed)
Central Venous Trialysis Catheter Insertion Procedure Note Chris RiversJerome A Connelly Jr. [id number] 12/01/1965  Procedure: Insertion of Central Venous Catheter Indications: Renal failure  Procedure Details Consent: Risks of procedure as well as the alternatives and risks of each were explained to the (patient/caregiver).  Consent for procedure obtained. Time Out: Verified patient identification, verified procedure, site/side was marked, verified correct patient position, special equipment/implants available, medications/allergies/relevent history reviewed, required imaging and test results available.  Performed  Maximum sterile technique was used including antiseptics, cap, gloves, gown, hand hygiene, mask and sheet. Skin prep: Chlorhexidine; local anesthetic administered A antimicrobial bonded/coated triple lumen catheter was placed in the right internal jugular vein using the Seldinger technique.  Evaluation Blood flow good Complications: No apparent complications Patient did tolerate procedure well. Chest X-ray ordered to verify placement.  CXR: pending.  U/S used in placement  Chris [name] 10/11/2017, 12:10 PM

## 2017-10-11 NOTE — Progress Notes (Signed)
ANTICOAGULATION CONSULT NOTE - Follow Up Consult  Pharmacy Consult for heparin Indication: pulmonary embolus/Afib  Allergies  Allergen Reactions  . Bactrim [Sulfamethoxazole-Trimethoprim] Hives    Patient Measurements: Height: 5\' 9"  (175.3 cm) Weight: 278 lb (126.1 kg) IBW/kg (Calculated) : 70.7 Heparin Dosing Weight: 97.7 kg  Vital Signs: Temp: 100 F (37.8 C) (03/03 0500) BP: 174/60 (03/03 0500) Pulse Rate: 53 (03/03 0500)  Labs: Recent Labs    10/08/17 1622 10/08/17 2140  10/09/17 0355 10/09/17 1247 10/09/17 2000 10/10/17 0211 10/10/17 0513 10/10/17 1856 10/11/17 0434  HGB  --   --    < > 10.4*  --   --  9.8*  --   --  9.4*  HCT  --   --   --  34.9*  --   --  33.1*  --   --  30.6*  PLT  --   --   --  126*  --   --  111*  --   --  150  APTT  --  52*  --  60* 66* 68*  --   --   --   --   LABPROT  --   --   --  15.1  --   --   --   --   --   --   INR  --   --   --  1.20  --   --   --   --   --   --   HEPARINUNFRC  --   --    < > 0.47  --  0.45  --  0.34  --  0.15*  CREATININE  --   --   --  3.11*  --   --  4.18*  --  5.07*  --   TROPONINI 0.03* 0.04*  --  0.05*  --   --   --   --   --   --    < > = values in this interval not displayed.    Estimated Creatinine Clearance: 22.6 mL/min (A) (by C-G formula based on SCr of 5.07 mg/dL (H)).   Assessment: [age] YOM with history of Afib on Eliquis at home, last dose given 2/25 @ 2100. New concern for PE this admission so switched to heparin. APTT this afternoon was 66, just barely within range.  Heparin level this morning now low (HL 0.15<<0.34 << 0.45, goal of 0.3-0.7). Hgb and platelet stable overnight. Per RN no bleeding or issues with IV overnight.   Goal of Therapy:  Heparin level 0.3-0.7 units/ml Monitor platelets by anticoagulation protocol: Yes   Plan:  Increase heparin to 2650 units/hr Daily heparin level/CBC Monitor s/sx of bleeding  Thank you for allowing pharmacy to be a part of this patient's  care.  Sheppard CoilFrank Wilson PharmD., BCPS Clinical Pharmacist 10/11/2017 5:34 AM

## 2017-10-11 NOTE — Progress Notes (Signed)
Subjective: Interval History: on vent, bp rising, I>O  Objective: Vital signs in last 24 hours: Temp:  [98.8 F (37.1 C)-100 F (37.8 C)] 99.7 F (37.6 C) (03/03 0630) Pulse Rate:  [52-98] 54 (03/03 0630) Resp:  [22-33] 24 (03/03 0630) BP: (157-186)/(57-92) 171/62 (03/03 0630) SpO2:  [87 %-99 %] 93 % (03/03 0630) FiO2 (%):  [60 %] 60 % (03/03 0400) Weight:  [127.6 kg (281 lb 4.9 oz)] 127.6 kg (281 lb 4.9 oz) (03/03 0500) Weight change: 1.5 kg (3 lb 4.9 oz)  Intake/Output from previous day: 03/02 0701 - 03/03 0700 In: 3417.7 [I.V.:1167.7; NG/GT:2120; IV Piggyback:130] Out: 960 [Urine:960] Intake/Output this shift: No intake/output data recorded.  General appearance: mild distress, moderately obese and on vent, ^RR. obese Neck: Trach Resp: rales bibasilar and rhonchi bilaterally Cardio: irregularly irregular rhythm, S1, S2 normal and systolic murmur: holosystolic 2/6, blowing at apex GI: obese, mod distension Extremities: edema 2+  Lab Results: Recent Labs    10/10/17 0211 10/11/17 0434  WBC 10.1 10.8*  HGB 9.8* 9.4*  HCT 33.1* 30.6*  PLT 111* 150   BMET:  Recent Labs    10/10/17 1856 10/11/17 0434  NA 141 140  K 5.4* 5.6*  CL 108 105  CO2 20* 21*  GLUCOSE 214* 241*  BUN 177* 188*  CREATININE 5.07* 5.77*  CALCIUM 7.9* 8.0*   No results for input(s): PTH in the last 72 hours. Iron Studies: No results for input(s): IRON, TIBC, TRANSFERRIN, FERRITIN in the last 72 hours.  Studies/Results: Dg Chest Port 1 View  Result Date: 10/09/2017 CLINICAL DATA:  Tracheostomy placement EXAM: PORTABLE CHEST 1 VIEW COMPARISON:  Study obtained earlier in the day FINDINGS: There is now a tracheostomy present with the tracheostomy tip 7.8 cm above the carina. Left jugular catheter tip is in the left innominate vein near the junction with the superior vena cava. No pneumothorax. A portion of the left base is not seen. There is atelectatic change in the right base. Visualized lungs  elsewhere clear. There is cardiomegaly with mild pulmonary venous hypertension. IMPRESSION: 1. Tube and catheter positions as described without pneumothorax evident. 2. A small portion of the left base is not visualized. There is atelectatic change in the right base. Visualized lungs elsewhere clear. 3.  There is a degree of pulmonary vascular congestion. Electronically Signed   By: Bretta BangWilliam  Woodruff III M.D.   On: 10/09/2017 12:58    I have reviewed the patient's current medications.  Assessment/Plan: 1 AKI nonoliguric.  Worsening solute, acid/base, K.  Will do IHD , get HD cath in. Will eval urine sediment againg 2 VDRF COPD and ARDS 3 HIV  More risk for infx 4 Obesity 5 Afib rate controlled 6 DM controlled 7Nutrition xs vol P HD, temp Hd cath, vent, TF, AB, Antivirals,     LOS: 10 days   Yahaira Bruski 10/11/2017,7:08 AM

## 2017-10-11 NOTE — Progress Notes (Signed)
ANTICOAGULATION CONSULT NOTE - Follow Up Consult  Pharmacy Consult for heparin Indication: pulmonary embolus/Afib  Allergies  Allergen Reactions  . Bactrim [Sulfamethoxazole-Trimethoprim] Hives    Patient Measurements: Height: 5\' 9"  (175.3 cm) Weight: 281 lb 4.9 oz (127.6 kg) IBW/kg (Calculated) : 70.7 Heparin Dosing Weight: 97.7 kg  Vital Signs: Temp: 100.8 F (38.2 C) (03/03 1000) BP: 162/65 (03/03 1000) Pulse Rate: 54 (03/03 1000)  Labs: Recent Labs    10/08/17 1622 10/08/17 2140  10/09/17 0355 10/09/17 1247 10/09/17 2000 10/10/17 0211 10/10/17 0513 10/10/17 1856 10/11/17 0434  HGB  --   --    < > 10.4*  --   --  9.8*  --   --  9.4*  HCT  --   --   --  34.9*  --   --  33.1*  --   --  30.6*  PLT  --   --   --  126*  --   --  111*  --   --  150  APTT  --  52*  --  60* 66* 68*  --   --   --   --   LABPROT  --   --   --  15.1  --   --   --   --   --   --   INR  --   --   --  1.20  --   --   --   --   --   --   HEPARINUNFRC  --   --    < > 0.47  --  0.45  --  0.34  --  0.15*  CREATININE  --   --    < > 3.11*  --   --  4.18*  --  5.07* 5.77*  TROPONINI 0.03* 0.04*  --  0.05*  --   --   --   --   --   --    < > = values in this interval not displayed.    Estimated Creatinine Clearance: 20 mL/min (A) (by C-G formula based on SCr of 5.77 mg/dL (H)).   Assessment: 89 YOM with history of Afib on Eliquis at home, last dose given 2/25 @ 2100. New concern for PE this admission so switched to heparin. APTT this afternoon was 66, just barely within range.  Heparin rate was increased this morning due to a low heparin level. The drip was held mid-morning in preparation for HD cath placement. Per CCM - okay to resume Heparin at 1400 today, will re-time level for later this evening to check on new drip rate.    Goal of Therapy:  Heparin level 0.3-0.7 units/ml Monitor platelets by anticoagulation protocol: Yes   Plan:  Continue heparin  2650 units/hr starting at 1400  today Will continue to monitor for any signs/symptoms of bleeding and will follow up with heparin level in 8 hours after restarted  Thank you for allowing pharmacy to be a part of this patient's care.  Georgina PillionElizabeth Allen Basista, PharmD, BCPS Clinical Pharmacist Pager: [phone] Clinical phone for 10/11/2017 from 7a-3:30p: [phone] If after 3:30p, please call main pharmacy at: [phone] 10/11/2017 11:42 AM

## 2017-10-11 NOTE — Progress Notes (Addendum)
PULMONARY / CRITICAL CARE MEDICINE   Name: Aidian Salomon. MRN: [id number] DOB: 06-19-1966    ADMISSION DATE:  10/01/2017 CONSULTATION DATE:  2/21  REFERRING MD:  Juleen China  CHIEF COMPLAINT:  SOB  HISTORY OF PRESENT ILLNESS:   [name] Mcelhannon [name]. is a 52 y.o. male with medical history significant for HIV disease on Biktarvy, atrial fibrillation on Eliquis, COPD on prn nasal cannula oxygen, and diabetes on oral agent.  Patient is also obese.  He reports that for 3 days he has had rhinorrhea, generalized aching, cough with productive white sputum.  He has utilized his home MDIs without relief.  He forgot to use his nebulizers.  He has not had any nausea, vomiting or diarrhea but has not had much appetite.  He felt hot but stated he did not take his temperature.  He also increased his oxygen to 3 L/min from a baseline of 2 L but did not have any improvement in his difficulty breathing.  After arrival to the ER he has required stairstep increases in his oxygen due to persistent hypoxemia and currently is on high flow nasal cannula at 15 L/min.  He is still tachypneic and tachycardic.  He was found to be febrile with an oral temperature of 101.9 F.  Influenza PCR pending.  Chest x-ray without focal infiltrate.  He has been treated presumptively as COPD exacerbation with possible bronchitis and has been given a dose of oral prednisone.  Levaquin was also ordered but given patient's recent hospitalization/discharge on 07/28/17 I have opted to utilize broad-spectrum empiric antibiotic coverage.  Patient's last CD4 count was 430 on 2/15 with a viral load of 22. Because of increased work of breathing and persistent tachycardia/tachypnea patient was placed on bipap and admitted to ICU, intubated shortly after.  Subjective:  Looks very uncomfortable and air hungry   VITAL SIGNS: BP (!) 171/62   Pulse (!) 54   Temp 99.7 F (37.6 C)   Resp (!) 24   Ht 5\' 9"  (1.753 m)   Wt 281 lb 4.9 oz (127.6 kg)   SpO2  92%   BMI 41.54 kg/m   HEMODYNAMICS: CVP:  [16 mmHg-17 mmHg] 16 mmHg  VENTILATOR SETTINGS: Vent Mode: PRVC FiO2 (%):  [60 %] 60 % Set Rate:  [30 bmp] 30 bmp Vt Set:  [600 mL] 600 mL PEEP:  [10 cmH20] 10 cmH20 Plateau Pressure:  [26 cmH20-32 cmH20] 28 cmH20  INTAKE / OUTPUT:  Intake/Output Summary (Last 24 hours) at 10/11/2017 0935 Last data filed at 10/11/2017 0600 Gross per 24 hour  Intake 2889.02 ml  Output 910 ml  Net 1979.02 ml   Filed Weights   10/09/17 0413 10/10/17 0415 10/11/17 0500  Weight: 273 lb 9.5 oz (124.1 kg) 278 lb (126.1 kg) 281 lb 4.9 oz (127.6 kg)   PHYSICAL EXAMINATION:  General: 52 year old acutely ill appearing male he is currently on full ventilator support HEENT: #6 cuffed tracheostomy in place minimal tracheal discharge mucous membranes moist Pulmonary: Diffuse rhonchi marked accessory use appears "air hungry" Cardiac: Regular rate and rhythm no murmur rub or gallop Abdomen: Large, soft, positive bowel sounds Extremities/musculoskeletal: Diffuse anasarca warm, dry Neuro/psych: Lethargic, interactive with wife only.  Generalized and diffuse weakness  LABS:  BMET Recent Labs  Lab 10/10/17 0211 10/10/17 1856 10/11/17 0434  NA 142 141 140  K 5.3* 5.4* 5.6*  CL 109 108 105  CO2 21* 20* 21*  BUN 161* 177* 188*  CREATININE 4.18* 5.07* 5.77*  GLUCOSE 214*  214* 241*   Electrolytes Recent Labs  Lab 10/09/17 0355 10/10/17 0211 10/10/17 1856 10/11/17 0434  CALCIUM 8.7* 8.3* 7.9* 8.0*  MG 2.9* 3.0*  --  3.4*  PHOS 3.9 5.5*  --  8.4*   CBC Recent Labs  Lab 10/09/17 0355 10/10/17 0211 10/11/17 0434  WBC 10.0 10.1 10.8*  HGB 10.4* 9.8* 9.4*  HCT 34.9* 33.1* 30.6*  PLT 126* 111* 150   Coag's Recent Labs  Lab 10/09/17 0355 10/09/17 1247 10/09/17 2000  APTT 60* 66* 68*  INR 1.20  --   --     Sepsis Markers Recent Labs  Lab 10/07/17 0318  PROCALCITON 0.24    ABG Recent Labs  Lab 10/09/17 0314 10/10/17 0335  10/11/17 0346  PHART 7.319* 7.317* 7.275*  PCO2ART 47.0 43.0 51.3*  PO2ART 62.0* 67.4* 70.0*    Liver Enzymes Recent Labs  Lab 10/09/17 0355 10/10/17 0211 10/11/17 0434  ALBUMIN 2.3* 2.1* 2.0*    Cardiac Enzymes Recent Labs  Lab 10/08/17 1622 10/08/17 2140 10/09/17 0355  TROPONINI 0.03* 0.04* 0.05*    Glucose Recent Labs  Lab 10/10/17 1203 10/10/17 1605 10/10/17 1956 10/11/17 0000 10/11/17 0405 10/11/17 0804  GLUCAP 189* 174* 177* 227* 200* 211*    Imaging No results found.  STUDIES:  CXR 2/21 >> no frank edema or consolidation CXR 2/22>> Endotracheal tube above the carina. Enteric tube extends into the left upper abdomen with tip and side-port likely in the proximal to mid stomach. Cardiomegaly with bibasilar atelectatic changes. CXR 2/23 >>Increased atelectasis at the right lung base CXR 2/24>> Persistent patchy basilar infiltrates R>L TTE 2/25 > Left ventricle: The cavity size was normal. Wall thickness was normal. Systolic function was normal. The estimated ejection   fraction was in the range of 60% to 65%. - Mitral valve: There was mild regurgitation   KUB 2/26 > ileus vs enteritis, less likely SBO  CULTURES: RVP >Flu A positive Tracheal aspirate 2/22 > rare G+ cocci and rods- reincubated > NRF Blood cx 2/21 > NGx5 days Trach asp 2/26 > normal flora Ucx 2/26 >> no growth Blood cx 26 >> PENDING  ANTIBIOTICS: Vancomycin 2/21 >>2/22 Cefepime 2/21 >> 2/28 Tamiflu 2/21 >> 2/26 Fluconazole 2/21 >> 2/27 Anidulafungin 2/27 >> Azithro 2/27 >> 3/1  SIGNIFICANT EVENTS: Admitted 2/21, intubated  LINES/TUBES: ETT 2/22 >> 3/1 OGT 2/22 >>3/1 PIV >> Foley 2/22 >> Left IJ 2/27 >>>  DISCUSSION: 51yom with COPD on PRN oxygen presented with ~2 day h/o SOB. Now with marked respiratory distress, remains SOB on BiPAP. Intubated. Influenza positive, occult pneumonia; superimposed on COPD exacerbation. Tamiflu, broad-spectrum abx, steroids, nebs. Fungal  coverage for thrush seen during intubation. Has been constipated requiring extensive bowel regimen to have BM. Afib uncontrolled, amio drip started.  For today we will transition to amiodarone via tube.  He starting dialysis today.  I am hopeful with volume removal his bronchospasm will improve as well as work of breathing.  I think this is somewhat related to volume status as well as metabolic derangements.  Dialysis should help with both of these issues  ASSESSMENT / PLAN:   Acute on chronic respiratory failure w/ hypoxia ARDS Flu > improving, however, remains hypoxemic.  COPD Possible PE -Completed Tamiflu and antimicrobials -Tracheostomy in satisfactory position.  Left IJ central line satisfactory.  Bibasilar airspace disease stable this was from 3/1 evaluation of chest x-ray -Marked bronchospasm on exam Plan Continue full ventilator support, continue to wean PEEP/FiO2 Increased BDs to q4 Add solumedrol  continue heparin drip  Houses for volume removal, hopefully with volume removal as well as correction of acid-base ventilator compliance and work of breathing should improve Will consider proximal XLT trach  History atrial fibrillation, currently in afib w/ RVR HTN Plan Continue amiodarone, will discontinue infusion and place him on via tube twice daily for a week and then transition to daily. Holding Eliquis, using heparin Continuing diltiazem, continuing as needed metoprolol, continue telemetry monitoring.  We will hold off on further titration of scheduled antihypertensives as dialysis plan for today.    AKI - worsening, nephrology feels this is secondary to hypoperfusion vs toxic with early ATN.  Metabolic acidosis - likely dt worsening renal failure -Renal function, as well as acid-base continues to decline he is massively volume overloaded -Appreciate nephrology assistance Plan For dialysis today Strict intake and output Follow-up a.m. chemistry.  Fluid and electrolyte  imbalance: Hyperkalemia Plan For hemodialysis today Follow-up a.m. chemistry   Obesity Constipation- large volume BM 2/27 Plan Continue tube feeds Continue bowel regimen  Anemia and thrombocytopenia.  No current evidence of bleeding but is on heparin infusion currently.  At baseline on NOAC Plan Trend CBC Transfuse per protocol HIV last CD4 >400, viral load 22.  esophageal candidiasis noted during intubation. Plan Infectious disease following Continue antivirals Continue Eraxis per infectious disease, course set to discontinue on 3/6.  Type 2 DM  - Last A1C 10.5 Plan   Continue resistant  scale sliding scale insulin Continue Lantus at current dosing, will add basal dosing of aspart insulin as well  FAMILY  - Updates: updated wife at bedside 3/1 - Inter-disciplinary family meet or Palliative Care meeting due by: 3/1  DVT prophylaxis: IV heparin SUP: H2B Diet: Tube feeds Activity: Bedrest Disposition : ICU  10/11/2017 9:35 AM Simonne MartinetPeter E Babcock ACNP-BC [facility] Pulmonary/Critical Care Pager # [phone] OR # [phone] if no answer'  Attending Note:  52 year old male with PMH of HIV who presents with Flu A and associated pneumonia.  Patient's FiO2 and PEEP remain high.  He developed renal failure while ill and now is requiring dialysis.  He is not weaning on exam with diffuse crackles.  I reviewed CXR myself, trach is in good position.  Discussed with PCCM-NP.  Will maintain BP with pressors as needed.  Start HD.  Continue abx.  Hold in the ICU for now.  Hope is that as volume negative is achieved, O2 and PEEP demand will improve and we will be able to wean.  The patient is critically ill with multiple organ systems failure and requires high complexity decision making for assessment and support, frequent evaluation and titration of therapies, application of advanced monitoring technologies and extensive interpretation of multiple databases.   Critical Care Time devoted to patient  care services described in this note is  35  Minutes. This time reflects time of care of this signee Dr Koren BoundWesam Yacoub. This critical care time does not reflect procedure time, or teaching time or supervisory time of PA/NP/Med student/Med Resident etc but could involve care discussion time.  Alyson ReedyWesam G. Yacoub, M.D. [facility] Pulmonary/Critical Care Medicine. Pager: [phone]. After hours pager: [phone].

## 2017-10-12 ENCOUNTER — Inpatient Hospital Stay (HOSPITAL_COMMUNITY): Payer: Medicare HMO

## 2017-10-12 DIAGNOSIS — Z930 Tracheostomy status: Secondary | ICD-10-CM

## 2017-10-12 LAB — CULTURE, BLOOD (ROUTINE X 2)
Culture: NO GROWTH
Culture: NO GROWTH
SPECIAL REQUESTS: ADEQUATE
Special Requests: ADEQUATE

## 2017-10-12 LAB — RENAL FUNCTION PANEL
ALBUMIN: 2 g/dL — AB (ref 3.5–5.0)
ANION GAP: 19 — AB (ref 5–15)
Albumin: 2 g/dL — ABNORMAL LOW (ref 3.5–5.0)
Anion gap: 15 (ref 5–15)
BUN: 170 mg/dL — AB (ref 6–20)
BUN: 142 mg/dL — AB (ref 6–20)
CALCIUM: 8 mg/dL — AB (ref 8.9–10.3)
CO2: 20 mmol/L — AB (ref 22–32)
CO2: 22 mmol/L (ref 22–32)
CREATININE: 5.09 mg/dL — AB (ref 0.61–1.24)
Calcium: 8.1 mg/dL — ABNORMAL LOW (ref 8.9–10.3)
Chloride: 98 mmol/L — ABNORMAL LOW (ref 101–111)
Chloride: 99 mmol/L — ABNORMAL LOW (ref 101–111)
Creatinine, Ser: 6.02 mg/dL — ABNORMAL HIGH (ref 0.61–1.24)
GFR calc Af Amer: 11 mL/min — ABNORMAL LOW (ref >60)
GFR calc Af Amer: 14 mL/min — ABNORMAL LOW (ref >60)
GFR calc non Af Amer: 10 mL/min — ABNORMAL LOW (ref >60)
GFR calc non Af Amer: 12 mL/min — ABNORMAL LOW (ref >60)
GLUCOSE: 325 mg/dL — AB (ref 65–99)
GLUCOSE: 262 mg/dL — AB (ref 65–99)
PHOSPHORUS: 8 mg/dL — AB (ref 2.5–4.6)
Phosphorus: 9.7 mg/dL — ABNORMAL HIGH (ref 2.5–4.6)
Potassium: 6.3 mmol/L (ref 3.5–5.1)
Potassium: 5.5 mmol/L — ABNORMAL HIGH (ref 3.5–5.1)
SODIUM: 137 mmol/L (ref 135–145)
SODIUM: 136 mmol/L (ref 135–145)

## 2017-10-12 LAB — CBC WITH DIFFERENTIAL/PLATELET
BASOS PCT: 0 %
Band Neutrophils: 0 %
Basophils Absolute: 0 10*3/uL (ref 0.0–0.1)
Blasts: 0 %
EOS PCT: 0 %
Eosinophils Absolute: 0 10*3/uL (ref 0.0–0.7)
HCT: 29.6 % — ABNORMAL LOW (ref 39.0–52.0)
HEMOGLOBIN: 9.2 g/dL — AB (ref 13.0–17.0)
LYMPHS ABS: 0.5 10*3/uL — AB (ref 0.7–4.0)
LYMPHS PCT: 4 %
MCH: 28.4 pg (ref 26.0–34.0)
MCHC: 31.1 g/dL (ref 30.0–36.0)
MCV: 91.4 fL (ref 78.0–100.0)
MONOS PCT: 4 %
MYELOCYTES: 0 %
Metamyelocytes Relative: 0 %
Monocytes Absolute: 0.5 10*3/uL (ref 0.1–1.0)
NEUTROS ABS: 12.4 10*3/uL — AB (ref 1.7–7.7)
NEUTROS PCT: 92 %
NRBC: 0 /100{WBCs}
Platelets: 207 10*3/uL (ref 150–400)
Promyelocytes Absolute: 0 %
RBC: 3.24 MIL/uL — AB (ref 4.22–5.81)
RDW: 14.3 % (ref 11.5–15.5)
WBC: 13.4 10*3/uL — ABNORMAL HIGH (ref 4.0–10.5)

## 2017-10-12 LAB — GLUCOSE, CAPILLARY
GLUCOSE-CAPILLARY: 315 mg/dL — AB (ref 65–99)
GLUCOSE-CAPILLARY: 252 mg/dL — AB (ref 65–99)
GLUCOSE-CAPILLARY: 252 mg/dL — AB (ref 65–99)
Glucose-Capillary: 217 mg/dL — ABNORMAL HIGH (ref 65–99)
Glucose-Capillary: 249 mg/dL — ABNORMAL HIGH (ref 65–99)
Glucose-Capillary: 261 mg/dL — ABNORMAL HIGH (ref 65–99)
Glucose-Capillary: 270 mg/dL — ABNORMAL HIGH (ref 65–99)

## 2017-10-12 LAB — HEPATITIS B CORE ANTIBODY, IGM: HEP B C IGM: NEGATIVE

## 2017-10-12 LAB — HEPARIN LEVEL (UNFRACTIONATED)
HEPARIN UNFRACTIONATED: 0.41 [IU]/mL (ref 0.30–0.70)
Heparin Unfractionated: 0.21 IU/mL — ABNORMAL LOW (ref 0.30–0.70)

## 2017-10-12 LAB — HCV COMMENT:

## 2017-10-12 LAB — HEPATITIS B SURFACE ANTIGEN: Hepatitis B Surface Ag: NEGATIVE

## 2017-10-12 LAB — HEPATITIS B SURFACE ANTIBODY,QUALITATIVE: Hep B S Ab: REACTIVE

## 2017-10-12 LAB — MAGNESIUM: Magnesium: 3.1 mg/dL — ABNORMAL HIGH (ref 1.7–2.4)

## 2017-10-12 LAB — HEPATITIS C ANTIBODY (REFLEX): HCV AB: 0.2 {s_co_ratio} (ref 0.0–0.9)

## 2017-10-12 MED ORDER — INSULIN ASPART 100 UNIT/ML ~~LOC~~ SOLN
10.0000 [IU] | SUBCUTANEOUS | Status: DC
  Administered 2017-10-12 – 2017-10-13 (×9): 10 [IU] via SUBCUTANEOUS
  Administered 2017-10-14: 5 [IU] via SUBCUTANEOUS
  Administered 2017-10-14 (×2): 10 [IU] via SUBCUTANEOUS

## 2017-10-12 MED ORDER — EMTRICITABINE 200 MG PO CAPS
200.0000 mg | ORAL_CAPSULE | ORAL | Status: DC
  Administered 2017-10-16 – 2017-10-20 (×2): 200 mg via ORAL
  Filled 2017-10-12 (×2): qty 1

## 2017-10-12 MED ORDER — HEPARIN SODIUM (PORCINE) 1000 UNIT/ML DIALYSIS
1000.0000 [IU] | INTRAMUSCULAR | Status: DC | PRN
  Administered 2017-10-20 (×2): 1200 [IU] via INTRAVENOUS_CENTRAL
  Filled 2017-10-12 (×2): qty 6
  Filled 2017-10-12: qty 3
  Filled 2017-10-12: qty 6

## 2017-10-12 MED ORDER — PRISMASOL BGK 4/2.5 32-4-2.5 MEQ/L IV SOLN
INTRAVENOUS | Status: DC
  Administered 2017-10-12 – 2017-10-14 (×24): via INTRAVENOUS_CENTRAL
  Filled 2017-10-12 (×29): qty 5000

## 2017-10-12 MED ORDER — SODIUM CHLORIDE 0.9% FLUSH: 10.0000 mL | INTRAVENOUS | Status: DC | PRN

## 2017-10-12 MED ORDER — PRISMASOL BGK 4/2.5 32-4-2.5 MEQ/L IV SOLN
INTRAVENOUS | Status: DC
  Administered 2017-10-12 – 2017-10-14 (×6): via INTRAVENOUS_CENTRAL
  Filled 2017-10-12 (×8): qty 5000

## 2017-10-12 MED ORDER — HEPARIN (PORCINE) 2000 UNITS/L FOR CRRT
INTRAVENOUS_CENTRAL | Status: DC | PRN
  Filled 2017-10-12: qty 1000

## 2017-10-12 MED ORDER — SODIUM CHLORIDE 0.9% FLUSH
10.0000 mL | Freq: Two times a day (BID) | INTRAVENOUS | Status: DC
  Administered 2017-10-12 – 2017-10-17 (×12): 10 mL
  Administered 2017-10-18: 20 mL
  Administered 2017-10-18 – 2017-10-26 (×10): 10 mL

## 2017-10-12 MED ORDER — CHLORHEXIDINE GLUCONATE CLOTH 2 % EX PADS
6.0000 | MEDICATED_PAD | Freq: Every day | CUTANEOUS | Status: DC
  Administered 2017-10-12 – 2017-10-29 (×17): 6 via TOPICAL

## 2017-10-12 MED ORDER — PRISMASOL BGK 4/2.5 32-4-2.5 MEQ/L IV SOLN
INTRAVENOUS | Status: DC
  Administered 2017-10-12 – 2017-10-13 (×3): via INTRAVENOUS_CENTRAL
  Filled 2017-10-12 (×4): qty 5000

## 2017-10-12 NOTE — Progress Notes (Signed)
[facility]   Reason for visit: Follow up on HIV, fever  Interval History: continues to have fever but no new source identified; Tmax 101.1; WBC pending, 10.8 yesterday;   Physical Exam: Constitutional:  Vitals:   10/12/17 0815 10/12/17 0830  BP: (!) 183/68   Pulse: 98 (!) 101  Resp: (!) 23 (!) 25  Temp: (!) 100.6 F (38.1 C) (!) 100.6 F (38.1 C)  SpO2: 94% 94%   patient is not responsive Eyes: anicteric, open HENT: + trach Respiratory: respiratory effort on vent; CTA B Cardiovascular: Tachy RR, + SEM GI: soft, nt, nd  Review of Systems: Unable to be assessed due to mental status  Lab Results  Component Value Date   WBC PENDING 10/12/2017   HGB 9.2 (L) 10/12/2017   HCT 29.6 (L) 10/12/2017   MCV 91.4 10/12/2017   PLT 207 10/12/2017    Lab Results  Component Value Date   CREATININE 6.02 (H) 10/12/2017   BUN 170 (H) 10/12/2017   NA 137 10/12/2017   K 6.3 (HH) 10/12/2017   CL 98 (L) 10/12/2017   CO2 20 (L) 10/12/2017    Lab Results  Component Value Date   ALT 18 10/02/2017   AST 23 10/02/2017   ALKPHOS 44 10/02/2017     Microbiology: Recent Results (from the past 240 hour(s))  Culture, respiratory (NON-Expectorated)     Status: None   Collection Time: 10/02/17 11:53 AM  Result Value Ref Range Status   Specimen Description TRACHEAL ASPIRATE  Final   Special Requests NONE  Final   Gram Stain   Final    FEW WBC PRESENT, PREDOMINANTLY PMN RARE GRAM POSITIVE COCCI RARE GRAM POSITIVE RODS    Culture   Final    MODERATE Consistent with normal respiratory flora. Performed at [facility], [address] [place], [place] [address]    Report Status 10/05/2017 FINAL  Final  Urine Culture     Status: None   Collection Time: 10/06/17 10:38 AM  Result Value Ref Range Status   Specimen Description URINE, CATHETERIZED  Final   Special Requests NONE  Final   Culture   Final    NO GROWTH Performed at [facility],  [address] [place], [place] [address]    Report Status 10/07/2017 FINAL  Final  Culture, respiratory (NON-Expectorated)     Status: None   Collection Time: 10/06/17 12:27 PM  Result Value Ref Range Status   Specimen Description TRACHEAL ASPIRATE  Final   Special Requests NONE  Final   Gram Stain   Final    MODERATE WBC PRESENT,BOTH PMN AND MONONUCLEAR NO ORGANISMS SEEN    Culture   Final    MODERATE Consistent with normal respiratory flora. Performed at [facility], [address] [place], [place] [address]    Report Status 10/08/2017 FINAL  Final  Culture, blood (routine x 2)     Status: None (Preliminary result)   Collection Time: 10/07/17 11:00 AM  Result Value Ref Range Status   Specimen Description BLOOD RIGHT ANTECUBITAL  Final   Special Requests   Final    BOTTLES DRAWN AEROBIC AND ANAEROBIC Blood Culture adequate volume   Culture   Final    NO GROWTH 4 DAYS Performed at [facility], [address] [place], [place] [address]    Report Status PENDING  Incomplete  Culture, blood (routine x 2)     Status: None (  Preliminary result)   Collection Time: 10/07/17 11:15 AM  Result Value Ref Range Status   Specimen Description BLOOD RIGHT HAND  Final   Special Requests   Final    BOTTLES DRAWN AEROBIC AND ANAEROBIC Blood Culture adequate volume   Culture   Final    NO GROWTH 4 DAYS Performed at [facility], [address] [place], [place] [address]    Report Status PENDING  Incomplete    Impression/Plan:  1. HIV -continues on renally dosed regimen.  Discussed with pharmacy and will adjust today for CRRT.  2.  Fever - no new source identified.  No significant changes in vent, CXR personally reviewed and some congestion, no opacities.    3.  Esophageal candida - on Eraxis day 6.  Will continue.

## 2017-10-12 NOTE — Progress Notes (Signed)
Inpatient Diabetes Program Recommendations  AACE/[name]: New Consensus Statement on Inpatient Glycemic Control (2015)  Target Ranges:  Prepandial:   less than 140 mg/dL      Peak postprandial:   less than 180 mg/dL (1-2 hours)      Critically ill patients:  140 - 180 mg/dL   Lab Results  Component Value Date   GLUCAP 261 (H) 10/12/2017   HGBA1C 10.5 09/02/2017    Review of Glycemic Control Results for Chris [name], Deontra A JR. (MRN [id number]) as of 10/12/2017 10:49  Ref. Range 10/11/2017 17:02 10/11/2017 20:11 10/11/2017 23:54 10/12/2017 04:36 10/12/2017 07:42  Glucose-Capillary Latest Ref Range: 65 - 99 mg/dL 409183 (H) 811219 (H) 914270 (H) 315 (H) 261 (H)   Inpatient Diabetes Program Recommendations:   Noted hyperglycemia when steroids added back and on tubefeeds. -Increase meal coverage to 10 and decrease when steroids decreased  Thank you, Darel HongJudy E. Soloman Mckeithan, RN, MSN, CDE  Diabetes Coordinator Inpatient Glycemic Control Team Team Pager [phone] (8am-5pm) 10/12/2017 10:50 AM

## 2017-10-12 NOTE — Progress Notes (Signed)
PULMONARY / CRITICAL CARE MEDICINE   Name: Chris [name]. MRN: [id number] DOB: 1966-03-19    ADMISSION DATE:  10/01/2017 CONSULTATION DATE:  2/21  REFERRING MD:  Juleen China  CHIEF COMPLAINT:  SOB  HISTORY OF PRESENT ILLNESS:    52 y.o. male with hx HIV (last CD4 count was 430 on 2/15), Afib on Eliquis, COPD, DM presented 2/21 with cough, dyspnea.  Found to be febrile, hypoxic, tachycardic, tachypneic.  Failed bipap requiring intubation and ICU admission r/t flu/ARDS.  Has failed to wean from vent, now s/p trach 3/1.    Subjective: Febrile overnight.  CRRT started this am per renal in setting massive volume overload.   VITAL SIGNS: BP (!) 161/63   Pulse 78   Temp 98.6 F (37 C)   Resp 18   Ht 5\' 9"  (1.753 m)   Wt 130.8 kg (288 lb 5.8 oz)   SpO2 91%   BMI 42.58 kg/m   HEMODYNAMICS: CVP:  [14 mmHg-21 mmHg] 21 mmHg  VENTILATOR SETTINGS: Vent Mode: PRVC FiO2 (%):  [60 %-80 %] 60 % Set Rate:  [30 bmp-302 bmp] 30 bmp Vt Set:  [600 mL] 600 mL PEEP:  [10 cmH20] 10 cmH20 Plateau Pressure:  [24 cmH20-33 cmH20] 28 cmH20  INTAKE / OUTPUT:  Intake/Output Summary (Last 24 hours) at 10/12/2017 1111 Last data filed at 10/12/2017 1100 Gross per 24 hour  Intake 4282.54 ml  Output 3653 ml  Net 629.54 ml   Filed Weights   10/10/17 0415 10/11/17 0500 10/12/17 0500  Weight: 126.1 kg (278 lb) 127.6 kg (281 lb 4.9 oz) 130.8 kg (288 lb 5.8 oz)   PHYSICAL EXAMINATION:  General: chronically ill appearing male, NAD on vent, CRRT  HEENT: #6 cuffed tracheostomy in place, mm moist  Pulmonary: resps even non labored on vent, coarse throughout, more comfortable on vent today  Cardiac: Regular rate and rhythm no murmur rub or gallop Abdomen: Large, soft, positive bowel sounds Extremities/musculoskeletal: Diffuse anasarca warm, dry Neuro/psych: sedated on vent, RASS -2   LABS:  BMET Recent Labs  Lab 10/10/17 1856 10/11/17 0434 10/12/17 0450  NA 141 140 137  K 5.4* 5.6* 6.3*  CL 108  105 98*  CO2 20* 21* 20*  BUN 177* 188* 170*  CREATININE 5.07* 5.77* 6.02*  GLUCOSE 214* 241* 325*   Electrolytes Recent Labs  Lab 10/10/17 0211 10/10/17 1856 10/11/17 0434 10/12/17 0450  CALCIUM 8.3* 7.9* 8.0* 8.1*  MG 3.0*  --  3.4* 3.1*  PHOS 5.5*  --  8.4* 9.7*   CBC Recent Labs  Lab 10/10/17 0211 10/11/17 0434 10/12/17 0450  WBC 10.1 10.8* PENDING  HGB 9.8* 9.4* 9.2*  HCT 33.1* 30.6* 29.6*  PLT 111* 150 207   Coag's Recent Labs  Lab 10/09/17 0355 10/09/17 1247 10/09/17 2000  APTT 60* 66* 68*  INR 1.20  --   --     Sepsis Markers Recent Labs  Lab 10/07/17 0318  PROCALCITON 0.24    ABG Recent Labs  Lab 10/10/17 0335 10/11/17 0346 10/11/17 2331  PHART 7.317* 7.275* 7.309*  PCO2ART 43.0 51.3* 48.6*  PO2ART 67.4* 70.0* 68.0*    Liver Enzymes Recent Labs  Lab 10/10/17 0211 10/11/17 0434 10/12/17 0450  ALBUMIN 2.1* 2.0* 2.0*    Cardiac Enzymes Recent Labs  Lab 10/08/17 1622 10/08/17 2140 10/09/17 0355  TROPONINI 0.03* 0.04* 0.05*    Glucose Recent Labs  Lab 10/11/17 1259 10/11/17 1702 10/11/17 2011 10/11/17 2354 10/12/17 0436 10/12/17 0742  GLUCAP 183*  183* 219* 270* 315* 261*    Imaging Dg Chest Port 1 View  Result Date: 10/12/2017 CLINICAL DATA:  Pneumonia. EXAM: PORTABLE CHEST 1 VIEW COMPARISON:  10/11/2017 FINDINGS: Tracheostomy tube overlies the airway. Bilateral jugular catheters terminate over the upper SVC, unchanged. Enteric tube courses into the upper abdomen with tip not imaged. The cardiac silhouette remains enlarged. Pulmonary vascular congestion is unchanged. Patchy bibasilar opacities are unchanged. No sizable pleural effusion or pneumothorax is identified. IMPRESSION: Unchanged appearance of the chest. Cardiomegaly, pulmonary vascular congestion, and mild bibasilar atelectasis. Electronically Signed   By: Sebastian AcheAllen  Grady M.D.   On: 10/12/2017 06:41   Dg Chest Port 1 View  Result Date: 10/11/2017 CLINICAL DATA:   Dialysis catheter insertion EXAM: PORTABLE CHEST 1 VIEW COMPARISON:  10/09/2017 FINDINGS: Tracheostomy in satisfactory position. Left IJ venous catheter terminates in the mid SVC. Right IJ dialysis catheter terminates in the mid SVC. Enteric tube courses below the diaphragm. Cardiomegaly. Pulmonary vascular congestion, without frank interstitial edema. Mild bibasilar atelectasis. No pleural effusion or pneumothorax. IMPRESSION: Right IJ dialysis catheter terminates in the mid SVC. Additional support apparatus as above. Cardiomegaly with pulmonary vascular congestion and mild bibasilar atelectasis. Electronically Signed   By: Charline BillsSriyesh  Krishnan M.D.   On: 10/11/2017 12:55    STUDIES:  CXR 2/21 >> no frank edema or consolidation CXR 2/22>> Endotracheal tube above the carina. Enteric tube extends into the left upper abdomen with tip and side-port likely in the proximal to mid stomach. Cardiomegaly with bibasilar atelectatic changes. CXR 2/23 >>Increased atelectasis at the right lung base CXR 2/24>> Persistent patchy basilar infiltrates R>L TTE 2/25 > Left ventricle: The cavity size was normal. Wall thickness was normal. Systolic function was normal. The estimated ejection   fraction was in the range of 60% to 65%. - Mitral valve: There was mild regurgitation   KUB 2/26 > ileus vs enteritis, less likely SBO  CULTURES: RVP >Flu A positive Tracheal aspirate 2/22 > rare G+ cocci and rods- reincubated > NRF Blood cx 2/21 > NGx5 days Trach asp 2/26 > normal flora Ucx 2/26 >> no growth Blood cx 2/26 >> ngtd>>>  ANTIBIOTICS: Vancomycin 2/21 >>2/22 Cefepime 2/21 >> 2/28 Tamiflu 2/21 >> 2/26 Fluconazole 2/21 >> 2/27 Anidulafungin 2/27 >> Azithro 2/27 >> 3/1  SIGNIFICANT EVENTS: Admitted 2/21, intubated  LINES/TUBES: ETT 2/22 >> 3/1 OGT 2/22 >>3/1 Left IJ 2/27 >>> R IJ HD cath 3/3>>>  DISCUSSION: 51yom with acute respiratory failure r/t flu/ARDS/PNA with superimposed AECOPD c/b AFib with RVR,  AKI and marked volume overload.    ASSESSMENT / PLAN:  Acute on chronic respiratory failure w/ hypoxia s/p trach 3/1 c/b volume overload.  ARDS Flu > improving slowly. Remains hypoxemic.  COPD - ongoing bronchospasm  Possible PE  Plan Vent support - 8cc/kg  F/u CXR  F/u ABG Increased BDs to q4 Volume removal with CRRT per renal  Continue solumedrol  continue heparin drip  May need to consider proximal XLT trach  History atrial fibrillation, currently in afib w/ RVR HTN Plan Continue amiodarone - now per tube  Cards signed off  Holding Eliquis, using heparin Continuing diltiazem, continuing as needed metoprolol.    AKI - worsening, nephrology feels this is secondary to hypoperfusion vs toxic with early ATN.  Metabolic acidosis - likely dt worsening renal failure Gross volume overload  Plan Renal following  CRRT initiated 3/4 Strict intake and output Follow-up a.m. chemistry.  Fluid and electrolyte imbalance: Hyperkalemia Plan CRRT as above  Follow-up a.m. chemistry  Obesity Constipation- large volume BM 2/27 Plan Continue tube feeds Continue bowel regimen  Anemia and thrombocytopenia.  No current evidence of bleeding but is on heparin infusion currently.  At baseline on NOAC Plan Trend CBC Transfuse per protocol HIV last CD4 >400, viral load 22.  esophageal candidiasis noted during intubation. Plan Infectious disease following Continue antivirals Continue Eraxis per infectious disease, course set to discontinue on 3/6.  Type 2 DM  - Last A1C 10.5 Plan   Continue resistant  scale sliding scale insulin Continue Lantus  Increase TF coverage to 10 units q4h   FAMILY  - Updates: no family at bedside 3/4 - Inter-disciplinary family meet or Palliative Care meeting due by: 3/1  DVT prophylaxis: IV heparin SUP: H2B Diet: Tube feeds Activity: Bedrest Disposition : ICU   Dirk Dress, NP 10/12/2017  11:11 AM Pager: [phone] or [phone]  [phone]

## 2017-10-12 NOTE — Progress Notes (Signed)
CRITICAL VALUE ALERT  Critical Value: K 6.3  Date & Time Notied:  10/12/17 6:04 AM  Provider Notified: Pola Cornelink

## 2017-10-12 NOTE — Progress Notes (Signed)
ANTICOAGULATION CONSULT NOTE - Follow Up Consult  Pharmacy Consult:  Heparin Indication: pulmonary embolus/Afib  Allergies  Allergen Reactions  . Bactrim [Sulfamethoxazole-Trimethoprim] Hives    Patient Measurements: Height: 5\' 9"  (175.3 cm) Weight: 288 lb 5.8 oz (130.8 kg) IBW/kg (Calculated) : 70.7 Heparin Dosing Weight: 98 kg  Vital Signs: Temp: 97 F (36.1 C) (03/04 1315) Temp Source: Core (03/04 0400) BP: 164/68 (03/04 1315) Pulse Rate: 78 (03/04 1315)  Labs: Recent Labs    10/09/17 2000  10/10/17 0211  10/10/17 1856 10/11/17 0434 10/11/17 2208 10/12/17 0450 10/12/17 1300  HGB  --    < > 9.8*  --   --  9.4*  --  9.2*  --   HCT  --   --  33.1*  --   --  30.6*  --  29.6*  --   PLT  --   --  111*  --   --  150  --  207  --   APTT 68*  --   --   --   --   --   --   --   --   HEPARINUNFRC 0.45  --   --    < >  --  0.15* 0.14* 0.21* 0.41  CREATININE  --    < > 4.18*  --  5.07* 5.77*  --  6.02*  --    < > = values in this interval not displayed.    Estimated Creatinine Clearance: 19.4 mL/min (A) (by C-G formula based on SCr of 6.02 mg/dL (H)).   Assessment: [age] YOM with history of Afib on Eliquis at home, last dose given 2/25 at 2100. New PE this admission so switched to heparin.    Heparin level now therapeutic; no bleeding reported.   Goal of Therapy:  Heparin level 0.3-0.7 units/ml Monitor platelets by anticoagulation protocol: Yes    Plan:  Continue heparin gtt at 2900 units/hr Daily heparin level and CBC   Chris [name], PharmD, BCPS Pager:  [phone] 10/12/2017, 1:47 PM

## 2017-10-12 NOTE — Progress Notes (Signed)
   Agree with PO amiodarone. No new recs.   Please call if we can be of further assistance.   Donato SchultzMark Arcadia Gorgas, MD

## 2017-10-12 NOTE — Progress Notes (Signed)
[facility] Rounding Note  Subjective/Interval History:  HD yesterday BP high Very catabolic, K back up to 6.3 [id number] Still 60% FIO2 Making some bu not much urine  Objective Vital signs in last 24 hours: Vitals:   10/12/17 0530 10/12/17 0545 10/12/17 0600 10/12/17 0615  BP: (!) 176/68 (!) 175/64 (!) 177/69 (!) 171/66  Pulse: 100 98 98 97  Resp: 17 (!) '27 17 19  ' Temp: 100.2 F (37.9 C) 100 F (37.8 C) (!) 100.4 F (38 C) (!) 100.6 F (38.1 C)  TempSrc:      SpO2: 95% 95% 95% 95%  Weight:      Height:       Weight change: 3.2 kg (7 lb 0.9 oz)  Intake/Output Summary (Last 24 hours) at 10/12/2017 0715 Last data filed at 10/12/2017 0600 Gross per 24 hour  Intake 3888.54 ml  Output 3330 ml  Net 558.54 ml   Physical Exam:  Blood pressure (!) 171/66, pulse 97, temperature (!) 100.6 F (38.1 C), resp. rate 19, height '5\' 9"'  (1.753 m), weight 130.8 kg (288 lb 5.8 oz), SpO2 95 %.  Intubated, sedated Generalized anasarca R IJ temp HD cath Coarse rhonchous breath sounds Tachy S1S2 NoS3 Obese abdomen, + BS SCD's in place Foley some pink tinged urine  Labs:  Recent Labs  Lab 10/06/17 0521 10/07/17 0318 10/07/17 2033 10/08/17 0440 10/09/17 0355 10/10/17 0211 10/10/17 1856 10/11/17 0434 10/12/17 0450  NA 150* 150* 154* 155* 145 142 141 140 137  K 4.5 5.9* 4.3 4.1 4.6 5.3* 5.4* 5.6* 6.3*  CL 115* 117* 121* 121* 112* 109 108 105 98*  CO2 '24 23 24 25 ' 21* 21* 20* 21* 20*  GLUCOSE 242* 420* 150* 62* 227* 214* 214* 241* 325*  BUN 78* 92* 113* 126* 150* 161* 177* 188* 170*  CREATININE 1.37* 1.51* 1.65* 2.31* 3.11* 4.18* 5.07* 5.77* 6.02*  CALCIUM 9.8 9.7 9.2 9.5 8.7* 8.3* 7.9* 8.0* 8.1*  PHOS 3.0 6.4*  --  3.4 3.9 5.5*  --  8.4* 9.7*    Recent Labs  Lab 10/10/17 0211 10/11/17 0434 10/12/17 0450  ALBUMIN 2.1* 2.0* 2.0*    Recent Labs  Lab 10/09/17 0355 10/10/17 0211 10/11/17 0434 10/12/17 0450  WBC 10.0 10.1 10.8* PENDING  NEUTROABS 7.3 8.2* 8.5* PENDING  HGB 10.4*  9.8* 9.4* 9.2*  HCT 34.9* 33.1* 30.6* 29.6*  MCV 94.8 93.2 92.7 91.4  PLT 126* 111* 150 207    Recent Labs  Lab 10/08/17 1000 10/08/17 1622 10/08/17 2140 10/09/17 0355  TROPONINI 0.03* 0.03* 0.04* 0.05*   CBG: Recent Labs  Lab 10/11/17 1259 10/11/17 1702 10/11/17 2011 10/11/17 2354 10/12/17 0436  GLUCAP 183* 183* 219* 270* 315*    Studies/Results: Dg Chest Port 1 View  Result Date: 10/12/2017 CLINICAL DATA:  Pneumonia. EXAM: PORTABLE CHEST 1 VIEW COMPARISON:  10/11/2017 FINDINGS: Tracheostomy tube overlies the airway. Bilateral jugular catheters terminate over the upper SVC, unchanged. Enteric tube courses into the upper abdomen with tip not imaged. The cardiac silhouette remains enlarged. Pulmonary vascular congestion is unchanged. Patchy bibasilar opacities are unchanged. No sizable pleural effusion or pneumothorax is identified. IMPRESSION: Unchanged appearance of the chest. Cardiomegaly, pulmonary vascular congestion, and mild bibasilar atelectasis. Electronically Signed   By: Logan Bores M.D.   On: 10/12/2017 06:41   Dg Chest Port 1 View  Result Date: 10/11/2017 CLINICAL DATA:  Dialysis catheter insertion EXAM: PORTABLE CHEST 1 VIEW COMPARISON:  10/09/2017 FINDINGS: Tracheostomy in satisfactory position. Left IJ venous catheter terminates in  the mid SVC. Right IJ dialysis catheter terminates in the mid SVC. Enteric tube courses below the diaphragm. Cardiomegaly. Pulmonary vascular congestion, without frank interstitial edema. Mild bibasilar atelectasis. No pleural effusion or pneumothorax. IMPRESSION: Right IJ dialysis catheter terminates in the mid SVC. Additional support apparatus as above. Cardiomegaly with pulmonary vascular congestion and mild bibasilar atelectasis. Electronically Signed   By: Julian Hy M.D.   On: 10/11/2017 12:55   Medications: . sodium chloride 250 mL (10/09/17 0600)  . sodium chloride    . sodium chloride    . anidulafungin Stopped (10/11/17  1954)  . fentaNYL infusion INTRAVENOUS 400 mcg/hr (10/11/17 2329)  . heparin 2,900 Units/hr (10/12/17 0601)   . amiodarone  200 mg Per Tube BID   Followed by  . [START ON 10/18/2017] amiodarone  200 mg Per Tube Daily  . budesonide (PULMICORT) nebulizer solution  0.5 mg Nebulization BID  . chlorhexidine gluconate (MEDLINE KIT)  15 mL Mouth Rinse BID  . Chlorhexidine Gluconate Cloth  6 each Topical Daily  . clonazePAM  1 mg Per Tube BID  . diltiazem  30 mg Per Tube Q6H  . dolutegravir  50 mg Oral Daily  . emtricitabine  200 mg Oral Once per day on Mon Thu  . famotidine  20 mg Per Tube QHS  . feeding supplement (VITAL HIGH PROTEIN)  1,000 mL Per Tube Q24H  . free water  300 mL Per Tube Q4H  . insulin aspart  0-20 Units Subcutaneous Q4H  . insulin aspart  6 Units Subcutaneous Q4H  . insulin glargine  30 Units Subcutaneous BID  . ipratropium-albuterol  3 mL Nebulization Q4H  . mouth rinse  15 mL Mouth Rinse 10 times per day  . methylPREDNISolone (SOLU-MEDROL) injection  40 mg Intravenous Q12H  . senna  1 tablet Oral Daily  . sodium bicarbonate  1,300 mg Oral TID  . sodium chloride flush  10-40 mL Intracatheter Q12H  . sodium chloride flush  3 mL Intravenous Q12H  . [START ON 10/14/2017] tenofovir  300 mg Oral Q Wed    Background: 52 yo AAM HIV AF COPD D< MO Admitted w/resp failure, VDRF. Initially + Flu A, PNA; possible PE;  Baseline creatinine 0.79 10/01/17. AKI in setting of above issues. HDX1 3/4 for volume, but markedly catabolic/recurrent [name], massively overloaded. CRRT initiated 10/12/17. R IJ temp cath 3/3.   Assessment/Recommendations  1. AKI - normal creatinine at baseline. Markedly catabolic. Anasarca. (no staff for IHD until much later today) I think best served by starting CRRT at this time for control of volume and azotemia.  2. VDRF/COPD/ARDS/FluA+ - finished Tamiflu/ATB's; on steroids. Per CCM 3. HIV -meds; ID following 4. Esophageal candidiasis - noted during intubation. IV  antifungals 5. Fevers - ID following. HIV meds and antifungal at present 6. AF - amio/heparin.low dose dilt 7. DM  Jamal Maes, MD [facility] [phone] pager 10/12/2017, 7:16 AM

## 2017-10-13 ENCOUNTER — Other Ambulatory Visit: Payer: Self-pay

## 2017-10-13 ENCOUNTER — Inpatient Hospital Stay (HOSPITAL_COMMUNITY): Payer: Medicare HMO

## 2017-10-13 LAB — BLOOD GAS, ARTERIAL
ACID-BASE EXCESS: 0.2 mmol/L (ref 0.0–2.0)
Acid-base deficit: 1 mmol/L (ref 0.0–2.0)
BICARBONATE: 25.6 mmol/L (ref 20.0–28.0)
Bicarbonate: 25.2 mmol/L (ref 20.0–28.0)
DRAWN BY: 518061
Drawn by: 347621
FIO2: 100
FIO2: 60
MECHVT: 600 mL
O2 SAT: 99 %
O2 Saturation: 93.5 %
PATIENT TEMPERATURE: 98.6
PATIENT TEMPERATURE: 98.6
PCO2 ART: 57.9 mmHg — AB (ref 32.0–48.0)
PEEP: 18 cmH2O
PEEP: 10 cmH2O
PH ART: 7.261 — AB (ref 7.350–7.450)
PH ART: 7.315 — AB (ref 7.350–7.450)
PO2 ART: 194 mmHg — AB (ref 83.0–108.0)
PO2 ART: 78.3 mmHg — AB (ref 83.0–108.0)
RATE: 30 resp/min
RATE: 30 resp/min
VT: 600 mL
pCO2 arterial: 51.7 mmHg — ABNORMAL HIGH (ref 32.0–48.0)

## 2017-10-13 LAB — GLUCOSE, CAPILLARY
GLUCOSE-CAPILLARY: 167 mg/dL — AB (ref 65–99)
GLUCOSE-CAPILLARY: 206 mg/dL — AB (ref 65–99)
GLUCOSE-CAPILLARY: 247 mg/dL — AB (ref 65–99)
Glucose-Capillary: 215 mg/dL — ABNORMAL HIGH (ref 65–99)
Glucose-Capillary: 173 mg/dL — ABNORMAL HIGH (ref 65–99)
Glucose-Capillary: 160 mg/dL — ABNORMAL HIGH (ref 65–99)

## 2017-10-13 LAB — RENAL FUNCTION PANEL
ALBUMIN: 2.1 g/dL — AB (ref 3.5–5.0)
Albumin: 2.1 g/dL — ABNORMAL LOW (ref 3.5–5.0)
Anion gap: 13 (ref 5–15)
Anion gap: 15 (ref 5–15)
BUN: 113 mg/dL — AB (ref 6–20)
BUN: 92 mg/dL — AB (ref 6–20)
CHLORIDE: 97 mmol/L — AB (ref 101–111)
CO2: 23 mmol/L (ref 22–32)
CO2: 23 mmol/L (ref 22–32)
CREATININE: 4.13 mg/dL — AB (ref 0.61–1.24)
Calcium: 8.5 mg/dL — ABNORMAL LOW (ref 8.9–10.3)
Calcium: 8.6 mg/dL — ABNORMAL LOW (ref 8.9–10.3)
Chloride: 99 mmol/L — ABNORMAL LOW (ref 101–111)
Creatinine, Ser: 3.31 mg/dL — ABNORMAL HIGH (ref 0.61–1.24)
GFR calc Af Amer: 18 mL/min — ABNORMAL LOW (ref >60)
GFR calc Af Amer: 23 mL/min — ABNORMAL LOW (ref >60)
GFR calc non Af Amer: 20 mL/min — ABNORMAL LOW (ref >60)
GFR, EST NON AFRICAN AMERICAN: 15 mL/min — AB (ref >60)
Glucose, Bld: 231 mg/dL — ABNORMAL HIGH (ref 65–99)
Glucose, Bld: 181 mg/dL — ABNORMAL HIGH (ref 65–99)
PHOSPHORUS: 7.2 mg/dL — AB (ref 2.5–4.6)
POTASSIUM: 5.5 mmol/L — AB (ref 3.5–5.1)
Phosphorus: 6.5 mg/dL — ABNORMAL HIGH (ref 2.5–4.6)
Potassium: 5.7 mmol/L — ABNORMAL HIGH (ref 3.5–5.1)
Sodium: 135 mmol/L (ref 135–145)
Sodium: 135 mmol/L (ref 135–145)

## 2017-10-13 LAB — CBC WITH DIFFERENTIAL/PLATELET
BASOS PCT: 0 %
Basophils Absolute: 0 10*3/uL (ref 0.0–0.1)
Eosinophils Absolute: 0 10*3/uL (ref 0.0–0.7)
Eosinophils Relative: 0 %
HCT: 27 % — ABNORMAL LOW (ref 39.0–52.0)
HEMOGLOBIN: 8.5 g/dL — AB (ref 13.0–17.0)
LYMPHS PCT: 0 %
Lymphs Abs: 0 10*3/uL — ABNORMAL LOW (ref 0.7–4.0)
MCH: 28.5 pg (ref 26.0–34.0)
MCHC: 31.5 g/dL (ref 30.0–36.0)
MCV: 90.6 fL (ref 78.0–100.0)
MONOS PCT: 4 %
Monocytes Absolute: 0.6 10*3/uL (ref 0.1–1.0)
NEUTROS PCT: 96 %
Neutro Abs: 15.6 10*3/uL — ABNORMAL HIGH (ref 1.7–7.7)
Platelets: 213 10*3/uL (ref 150–400)
RBC: 2.98 MIL/uL — ABNORMAL LOW (ref 4.22–5.81)
RDW: 14 % (ref 11.5–15.5)
WBC: 16.2 10*3/uL — ABNORMAL HIGH (ref 4.0–10.5)

## 2017-10-13 LAB — HEPARIN LEVEL (UNFRACTIONATED): HEPARIN UNFRACTIONATED: 0.45 [IU]/mL (ref 0.30–0.70)

## 2017-10-13 LAB — BASIC METABOLIC PANEL
Anion gap: 12 (ref 5–15)
BUN: 84 mg/dL — ABNORMAL HIGH (ref 6–20)
CALCIUM: 8.5 mg/dL — AB (ref 8.9–10.3)
CO2: 24 mmol/L (ref 22–32)
CREATININE: 3.11 mg/dL — AB (ref 0.61–1.24)
Chloride: 98 mmol/L — ABNORMAL LOW (ref 101–111)
GFR, EST AFRICAN AMERICAN: 25 mL/min — AB (ref >60)
GFR, EST NON AFRICAN AMERICAN: 22 mL/min — AB (ref >60)
Glucose, Bld: 217 mg/dL — ABNORMAL HIGH (ref 65–99)
Potassium: 5.9 mmol/L — ABNORMAL HIGH (ref 3.5–5.1)
SODIUM: 134 mmol/L — AB (ref 135–145)

## 2017-10-13 LAB — MAGNESIUM: MAGNESIUM: 3 mg/dL — AB (ref 1.7–2.4)

## 2017-10-13 MED ORDER — WARFARIN - PHARMACIST DOSING INPATIENT
Freq: Every day | Status: DC
  Administered 2017-10-14: 17:00:00

## 2017-10-13 MED ORDER — SENNOSIDES 8.8 MG/5ML PO SYRP
5.0000 mL | ORAL_SOLUTION | Freq: Every day | ORAL | Status: DC
Start: 2017-10-13 — End: 2017-10-31
  Administered 2017-10-13 – 2017-10-29 (×15): 5 mL
  Filled 2017-10-13 (×18): qty 5

## 2017-10-13 MED ORDER — IPRATROPIUM-ALBUTEROL 0.5-2.5 (3) MG/3ML IN SOLN
3.0000 mL | Freq: Four times a day (QID) | RESPIRATORY_TRACT | Status: DC
  Administered 2017-10-13 – 2017-10-23 (×40): 3 mL via RESPIRATORY_TRACT
  Filled 2017-10-13 (×36): qty 3
  Filled 2017-10-13: qty 6
  Filled 2017-10-13 (×3): qty 3

## 2017-10-13 MED ORDER — WARFARIN SODIUM 10 MG PO TABS
10.0000 mg | ORAL_TABLET | Freq: Once | ORAL | Status: AC
  Administered 2017-10-13: 10 mg via ORAL
  Filled 2017-10-13: qty 1

## 2017-10-13 MED ORDER — DOCUSATE SODIUM 50 MG/5ML PO LIQD
100.0000 mg | Freq: Two times a day (BID) | ORAL | Status: DC
  Administered 2017-10-13 – 2017-10-30 (×24): 100 mg
  Filled 2017-10-13 (×37): qty 10

## 2017-10-13 MED ORDER — FREE WATER
100.0000 mL | Status: DC
  Administered 2017-10-13 – 2017-10-14 (×7): 100 mL

## 2017-10-13 MED ORDER — INSULIN GLARGINE 100 UNIT/ML ~~LOC~~ SOLN
35.0000 [IU] | Freq: Two times a day (BID) | SUBCUTANEOUS | Status: DC
  Administered 2017-10-13 – 2017-10-23 (×22): 35 [IU] via SUBCUTANEOUS
  Filled 2017-10-13 (×23): qty 0.35

## 2017-10-13 MED ORDER — CLONAZEPAM 0.5 MG PO TABS
0.5000 mg | ORAL_TABLET | Freq: Two times a day (BID) | ORAL | Status: DC
  Administered 2017-10-13 – 2017-10-16 (×6): 0.5 mg
  Filled 2017-10-13 (×7): qty 1

## 2017-10-13 MED ORDER — ARFORMOTEROL TARTRATE 15 MCG/2ML IN NEBU
15.0000 ug | INHALATION_SOLUTION | Freq: Two times a day (BID) | RESPIRATORY_TRACT | Status: DC
  Administered 2017-10-13: 15 ug via RESPIRATORY_TRACT
  Filled 2017-10-13: qty 2

## 2017-10-13 MED ORDER — PREDNISONE 5 MG/5ML PO SOLN
30.0000 mg | Freq: Every day | ORAL | Status: DC
  Filled 2017-10-13: qty 30

## 2017-10-13 MED ORDER — PREDNISONE 20 MG PO TABS
30.0000 mg | ORAL_TABLET | Freq: Every day | ORAL | Status: DC
  Administered 2017-10-13 – 2017-10-14 (×2): 30 mg
  Filled 2017-10-13 (×2): qty 1

## 2017-10-13 NOTE — Progress Notes (Signed)
[facility] has had worsening hypoxia. This patient is well known to me, with hx COPD, HIV, Afib (on eliquis), DM, GERD, Chronic hypoxia on 2L O2 PRN, admitted with Flu and now developed ARDS related to the Flu. Since then is trached but still on high vent settings, currently on 100% FIO2 and 18 PEEP with Pox in low 80's. On exam lungs coarse b/l, 1+ BUE and BLE edema. No secretions from suction of trach. Has developed AKI and started on CRRT on 3/4 AM. Currently on CRRT with SBP 120's; RN to page Renal and ask if she can pull more on CRRT since patient is tolerating it well. CXR shows bibasilar infiltrates. Will start chest PT q4hrs in addition to the duonebs q4 he is already receiving. On Solumedrol 40mg  IV q12 and Pulmicort nebs BID. Start Brovana nebs BID since he is on an ICS/LABA as an outpatient. Obtain a sputum culture. He had been hypernatremic a week ago but has since resolved now Na 135 and steadily decreasing over past several days; will decrease free water flushes from 300cc to 100cc q4hrs. Hyperglycemic despite high dose SSI; will increase lantus from 30u to 35u BID.   60 minutes critical care time   Milana ObeyKathleen Lynne Takemoto, MD Pulmonary & Critical Care Medicine Pager: [phone]

## 2017-10-13 NOTE — Progress Notes (Signed)
[facility] Rounding Note  Subjective/Interval History:  HD 3/3 CRRT started yesterday 2/2 hyperkalemia, marked azotemia, high O2 requirement Pre 400 Post 300  DFR 2000 Effluent dose 20 ml/kg/hour Temp cath 3/3 R IJ  Objective Vital signs in last 24 hours: Vitals:   10/13/17 0800 10/13/17 0815 10/13/17 0820 10/13/17 0826  BP: (!) 184/75 (!) 176/84    Pulse: 82 82    Resp: 17 18    Temp: 98.6 F (37 C) 98.4 F (36.9 C)    TempSrc:      SpO2: 94% 94% 95% 94%  Weight:      Height:       Weight change: -8.3 kg (-4.8 oz)  Intake/Output Summary (Last 24 hours) at 10/13/2017 0838 Last data filed at 10/13/2017 0800 Gross per 24 hour  Intake 3994.95 ml  Output 6796 ml  Net -2801.05 ml   Physical Exam:  Blood pressure (!) 176/84, pulse 82, temperature 98.4 F (36.9 C), resp. rate 18, height '5\' 9"'  (1.753 m), weight 122.5 kg (270 lb 1 oz), SpO2 94 %.  Trached Eyes open, not following commands, no spont movements Generalized anasarca no real change yet R IJ temp HD cath (3/3) Coarse rhonchous breath sounds Tachy S1S2 NoS3 Obese abdomen, + BS SCD's in place Foley some pink tinged urine minimal  Recent Labs  Lab 10/08/17 0440 10/09/17 0355 10/10/17 0211 10/10/17 1856 10/11/17 0434 10/12/17 0450 10/12/17 1543 10/13/17 0254  NA 155* 145 142 141 140 137 136 135  K 4.1 4.6 5.3* 5.4* 5.6* 6.3* 5.5* 5.5*  CL 121* 112* 109 108 105 98* 99* 99*  CO2 25 21* 21* 20* 21* 20* 22 23  GLUCOSE 62* 227* 214* 214* 241* 325* 262* 231*  BUN 126* 150* 161* 177* 188* 170* 142* 113*  CREATININE 2.31* 3.11* 4.18* 5.07* 5.77* 6.02* 5.09* 4.13*  CALCIUM 9.5 8.7* 8.3* 7.9* 8.0* 8.1* 8.0* 8.5*  PHOS 3.4 3.9 5.5*  --  8.4* 9.7* 8.0* 7.2*    Recent Labs  Lab 10/12/17 0450 10/12/17 1543 10/13/17 0254  ALBUMIN 2.0* 2.0* 2.1*    Recent Labs  Lab 10/10/17 0211 10/11/17 0434 10/12/17 0450 10/13/17 0254  WBC 10.1 10.8* 13.4* 16.2*  NEUTROABS 8.2* 8.5* 12.4* 15.6*  HGB 9.8* 9.4* 9.2* 8.5*  HCT  33.1* 30.6* 29.6* 27.0*  MCV 93.2 92.7 91.4 90.6  PLT 111* 150 207 213    Recent Labs  Lab 10/08/17 1000 10/08/17 1622 10/08/17 2140 10/09/17 0355  TROPONINI 0.03* 0.03* 0.04* 0.05*   CBG: Recent Labs  Lab 10/12/17 1126 10/12/17 1529 10/12/17 2013 10/12/17 2347 10/13/17 0323  GLUCAP 249* 252* 252* 217* 215*    Studies/Results: Dg Chest Port 1 View  Result Date: 10/13/2017 CLINICAL DATA:  Oxygen desaturation. EXAM: PORTABLE CHEST 1 VIEW COMPARISON:  Radiograph yesterday at 0359 hour FINDINGS: Tracheostomy tube at the thoracic inlet. Enteric tube in place, tip below the diaphragm not included in the field of view. Right internal jugular sheath with tip projecting over the proximal SVC, unchanged. Left internal jugular central venous catheter with tip projecting over the proximal SVC, unchanged. Worsening right lung base aeration with increasing patchy and hazy opacity. Cardiomegaly and vascular congestion are unchanged. Left lung base atelectasis is unchanged. No pneumothorax. IMPRESSION: 1. Worsening patchy and hazy opacity at the right lung base may represent developing pleural effusion and atelectasis or airspace disease. 2. Cardiomegaly and vascular congestion are again seen. 3. Support apparatus are unchanged. Electronically Signed   By: Fonnie Birkenhead.D.  On: 10/13/2017 05:24   Dg Chest Port 1 View  Result Date: 10/12/2017 CLINICAL DATA:  Pneumonia. EXAM: PORTABLE CHEST 1 VIEW COMPARISON:  10/11/2017 FINDINGS: Tracheostomy tube overlies the airway. Bilateral jugular catheters terminate over the upper SVC, unchanged. Enteric tube courses into the upper abdomen with tip not imaged. The cardiac silhouette remains enlarged. Pulmonary vascular congestion is unchanged. Patchy bibasilar opacities are unchanged. No sizable pleural effusion or pneumothorax is identified. IMPRESSION: Unchanged appearance of the chest. Cardiomegaly, pulmonary vascular congestion, and mild bibasilar  atelectasis. Electronically Signed   By: Logan Bores M.D.   On: 10/12/2017 06:41   Dg Chest Port 1 View  Result Date: 10/11/2017 CLINICAL DATA:  Dialysis catheter insertion EXAM: PORTABLE CHEST 1 VIEW COMPARISON:  10/09/2017 FINDINGS: Tracheostomy in satisfactory position. Left IJ venous catheter terminates in the mid SVC. Right IJ dialysis catheter terminates in the mid SVC. Enteric tube courses below the diaphragm. Cardiomegaly. Pulmonary vascular congestion, without frank interstitial edema. Mild bibasilar atelectasis. No pleural effusion or pneumothorax. IMPRESSION: Right IJ dialysis catheter terminates in the mid SVC. Additional support apparatus as above. Cardiomegaly with pulmonary vascular congestion and mild bibasilar atelectasis. Electronically Signed   By: Julian Hy M.D.   On: 10/11/2017 12:55   Medications: . sodium chloride 250 mL (10/12/17 1200)  . sodium chloride    . sodium chloride    . anidulafungin Stopped (10/12/17 2018)  . fentaNYL infusion INTRAVENOUS 100 mcg/hr (10/13/17 0807)  . heparin 2,900 Units/hr (10/13/17 0800)  . heparin    . dialysis replacement fluid (prismasate) 400 mL/hr at 10/12/17 2308  . dialysis replacement fluid (prismasate) 300 mL/hr at 10/13/17 0210  . dialysate (PRISMASATE) 2,000 mL/hr at 10/13/17 0738   . amiodarone  200 mg Per Tube BID   Followed by  . [START ON 10/18/2017] amiodarone  200 mg Per Tube Daily  . arformoterol  15 mcg Nebulization BID  . budesonide (PULMICORT) nebulizer solution  0.5 mg Nebulization BID  . chlorhexidine gluconate (MEDLINE KIT)  15 mL Mouth Rinse BID  . Chlorhexidine Gluconate Cloth  6 each Topical Daily  . clonazePAM  1 mg Per Tube BID  . diltiazem  30 mg Per Tube Q6H  . docusate  100 mg Per Tube BID  . dolutegravir  50 mg Oral Daily  . [START ON 10/16/2017] emtricitabine  200 mg Oral Once every 4 days  . famotidine  20 mg Per Tube QHS  . feeding supplement (VITAL HIGH PROTEIN)  1,000 mL Per Tube Q24H  .  free water  100 mL Per Tube Q4H  . insulin aspart  0-20 Units Subcutaneous Q4H  . insulin aspart  10 Units Subcutaneous Q4H  . insulin glargine  35 Units Subcutaneous BID  . ipratropium-albuterol  3 mL Nebulization Q4H  . mouth rinse  15 mL Mouth Rinse 10 times per day  . methylPREDNISolone (SOLU-MEDROL) injection  40 mg Intravenous Q12H  . sennosides  5 mL Per Tube QHS  . sodium chloride flush  10-40 mL Intracatheter Q12H  . sodium chloride flush  3 mL Intravenous Q12H  . [START ON 10/14/2017] tenofovir  300 mg Oral Q Wed    Background: 52 yo AAM HIV AF COPD D< MO Admitted w/resp failure, VDRF. Initially + Flu A, PNA; possible PE;  Baseline creatinine 0.79 10/01/17. AKI in setting of above issues. HDX1 3/4 for volume, but markedly catabolic/recurrent [name], massively overloaded. CRRT initiated 10/12/17 for massive anasarca hyperkalemia. R IJ temp cath 3/3 by CCM.  Assessment/Recommendations  1. AKI - normal creatinine at baseline. Markedly catabolic. Anasarca. CRRT started 3/4. Pulling 150-200 cc/hour. K and phos coming down some. D/C foley. Daily bladder scan 2. VDRF/COPD/ARDS/FluA+ - finished Tamiflu/ATB's; on steroids. Per CCM 3. HIV -meds; ID following 4. Esophageal candidiasis - noted during intubation. IV antifungals 5. Fevers - ID following. HIV meds and antifungal at present 6. AF - amio/heparin.low dose dilt 7. DM 8. Encephalopathy - eyes open, no pain response per nursing. W/U per CCM.  Jamal Maes, MD [facility] [phone] pager 10/13/2017, 8:38 AM

## 2017-10-13 NOTE — Progress Notes (Signed)
eLink Physician-Brief Progress Note Patient Name: Douglass RiversJerome A Dantuono Jr. DOB: 03/21/1966 MRN: [id number]   Date of Service  10/13/2017  HPI/Events of Note  Multiple calls for desaturation  eICU Interventions  Finally at 100% and PEEP of 18 CXR ordered        YACOUB,WESAM 10/13/2017, 4:49 AM

## 2017-10-13 NOTE — Progress Notes (Addendum)
ANTICOAGULATION CONSULT NOTE - Follow Up Consult  Pharmacy Consult for heparin + warfarin Indication: pulmonary embolus/Afib  Allergies  Allergen Reactions  . Bactrim [Sulfamethoxazole-Trimethoprim] Hives    Patient Measurements: Height: _0  (175.3 cm) Weight: 270 lb 1 oz (122.5 kg) IBW/kg (Calculated) : 70.7 Heparin Dosing Weight: 98 kg  Vital Signs: Temp: 98.6 F (37 C) (03/05 0245) Temp Source: Core (03/04 2000) BP: 122/65 (03/05 0545) Pulse Rate: 77 (03/05 0545)  Labs: Recent Labs    10/11/17 0434  10/12/17 0450 10/12/17 1300 10/12/17 1543 10/13/17 0252 10/13/17 0254  HGB 9.4*  --  9.2*  --   --   --  8.5*  HCT 30.6*  --  29.6*  --   --   --  27.0*  PLT 150  --  207  --   --   --  213  HEPARINUNFRC 0.15*   < > 0.21* 0.41  --  0.45  --   CREATININE 5.77*  --  6.02*  --  5.09*  --  4.13*   < > = values in this interval not displayed.    Estimated Creatinine Clearance: 27.4 mL/min (A) (by C-G formula based on SCr of 4.13 mg/dL (H)).   Medications:  Scheduled:  . amiodarone  200 mg Per Tube BID   Followed by  . [START ON 10/18/2017] amiodarone  200 mg Per Tube Daily  . arformoterol  15 mcg Nebulization BID  . budesonide (PULMICORT) nebulizer solution  0.5 mg Nebulization BID  . chlorhexidine gluconate (MEDLINE KIT)  15 mL Mouth Rinse BID  . Chlorhexidine Gluconate Cloth  6 each Topical Daily  . clonazePAM  1 mg Per Tube BID  . diltiazem  30 mg Per Tube Q6H  . docusate  100 mg Per Tube BID  . dolutegravir  50 mg Oral Daily  . [START ON 10/16/2017] emtricitabine  200 mg Oral Once every 4 days  . famotidine  20 mg Per Tube QHS  . feeding supplement (VITAL HIGH PROTEIN)  1,000 mL Per Tube Q24H  . free water  100 mL Per Tube Q4H  . insulin aspart  0-20 Units Subcutaneous Q4H  . insulin aspart  10 Units Subcutaneous Q4H  . insulin glargine  35 Units Subcutaneous BID  . ipratropium-albuterol  3 mL Nebulization Q4H  . mouth rinse  15 mL Mouth Rinse 10 times per  day  . methylPREDNISolone (SOLU-MEDROL) injection  40 mg Intravenous Q12H  . sennosides  5 mL Per Tube QHS  . sodium chloride flush  10-40 mL Intracatheter Q12H  . sodium chloride flush  3 mL Intravenous Q12H  . [START ON 10/14/2017] tenofovir  300 mg Oral Q Wed   Infusions:  . sodium chloride 250 mL (10/12/17 1200)  . sodium chloride    . sodium chloride    . anidulafungin Stopped (10/12/17 2018)  . fentaNYL infusion INTRAVENOUS 200 mcg/hr (10/12/17 2303)  . heparin 2,900 Units/hr (10/13/17 0515)  . heparin    . dialysis replacement fluid (prismasate) 400 mL/hr at 10/12/17 2308  . dialysis replacement fluid (prismasate) 300 mL/hr at 10/13/17 0210  . dialysate (PRISMASATE) 2,000 mL/hr at 10/13/17 0502    Assessment: 89 YOM with Afib on Eliquis PTA, now with new PE this admission, switched to heparin, now to start warfarin given Eliquis failure. Today heparin level is therapeutic at 0.45. Last INR was 1.2 on 3/1. Hgb is down today (9.2>8.5), platelets are stable and no bleeding noted.  Goal of Therapy:  Heparin  level 0.3-0.7 units/ml Monitor platelets by anticoagulation protocol: Yes   Plan:  Continue heparin 2900 units/hr Start warfarin 10 mg PO x 1 tonight. Daily heparin level, INR and CBC Monitor s/sx of bleeding   Charlene Brooke, PharmD PGY1 Pharmacy Resident Pager: [phone] After 4:00PM please call [place] [phone] 10/13/2017,6:11 AM

## 2017-10-13 NOTE — Progress Notes (Signed)
Unable to obtain sputum sample at this time. Patient dry. Will report to oncoming RT.

## 2017-10-13 NOTE — Progress Notes (Signed)
K+ 5.7 called to Dr Eliott Nineunham .orders received to change rate of CRRT pre and post

## 2017-10-13 NOTE — Progress Notes (Addendum)
PULMONARY / CRITICAL CARE MEDICINE   Name: Chris RiversJerome A Sills [name]. MRN: [id number] DOB: 06/04/1966    ADMISSION DATE:  10/01/2017 CONSULTATION DATE:  2/21  REFERRING MD:  Juleen ChinaKohut  CHIEF COMPLAINT:  SOB  HISTORY OF PRESENT ILLNESS:   52 yo male presented with dyspnea, cough, fever, hypoxia.  Found to have Influenza PNA causing ARDS.  Course complicated by hypervolemia and renal failure.  PMHx of HIV, A fib, COPD, DM.   SUBJECTIVE:  Remains basically unresponsive at this time.  VITAL SIGNS: BP (!) 171/75   Pulse 77   Temp 98.6 F (37 C)   Resp 18   Ht 5\' 9"  (1.753 m)   Wt 122.5 kg (270 lb 1 oz)   SpO2 93%   BMI 39.88 kg/m   HEMODYNAMICS: CVP:  [16 mmHg-17 mmHg] 17 mmHg  VENTILATOR SETTINGS: Vent Mode: PRVC FiO2 (%):  [60 %-90 %] 60 % Set Rate:  [30 bmp] 30 bmp Vt Set:  [600 mL] 600 mL PEEP:  [10 cmH20-16 cmH20] 10 cmH20 Plateau Pressure:  [25 cmH20-28 cmH20] 25 cmH20  INTAKE / OUTPUT: I/O last 3 completed shifts: In: 5179.3 [I.V.:1484.3; XL/KG:4010G/GT:3625; IV Piggyback:70] Out: [id number] [Urine:640; Other:6245]  PHYSICAL EXAMINATION:  General: 52 year old male nonresponsive to verbal stimuli HEENT: Trach in place, gastric tube in place PSY: None available Neuro: Does not arouse to voice follow commands CV: Heart sounds are regular atrial fib PULM: even/non-labored, lungs bilaterally decreased in the bases GI: Mild distention Extremities: warm/dry, 2+ edema  Skin: no rashes or lesions   LABS:  BMET Recent Labs  Lab 10/12/17 0450 10/12/17 1543 10/13/17 0254  NA 137 136 135  K 6.3* 5.5* 5.5*  CL 98* 99* 99*  CO2 20* 22 23  BUN 170* 142* 113*  CREATININE 6.02* 5.09* 4.13*  GLUCOSE 325* 262* 231*   Electrolytes Recent Labs  Lab 10/11/17 0434 10/12/17 0450 10/12/17 1543 10/13/17 0254  CALCIUM 8.0* 8.1* 8.0* 8.5*  MG 3.4* 3.1*  --  3.0*  PHOS 8.4* 9.7* 8.0* 7.2*   CBC Recent Labs  Lab 10/11/17 0434 10/12/17 0450 10/13/17 0254  WBC 10.8* 13.4* 16.2*  HGB  9.4* 9.2* 8.5*  HCT 30.6* 29.6* 27.0*  PLT 150 207 213   Coag's Recent Labs  Lab 10/09/17 0355 10/09/17 1247 10/09/17 2000  APTT 60* 66* 68*  INR 1.20  --   --     Sepsis Markers Recent Labs  Lab 10/07/17 0318  PROCALCITON 0.24    ABG Recent Labs  Lab 10/11/17 0346 10/11/17 2331 10/13/17 0550  PHART 7.275* 7.309* 7.261*  PCO2ART 51.3* 48.6* 57.9*  PO2ART 70.0* 68.0* 194*    Liver Enzymes Recent Labs  Lab 10/12/17 0450 10/12/17 1543 10/13/17 0254  ALBUMIN 2.0* 2.0* 2.1*    Cardiac Enzymes Recent Labs  Lab 10/08/17 1622 10/08/17 2140 10/09/17 0355  TROPONINI 0.03* 0.04* 0.05*    Glucose Recent Labs  Lab 10/12/17 0742 10/12/17 1126 10/12/17 1529 10/12/17 2013 10/12/17 2347 10/13/17 0323  GLUCAP 261* 249* 252* 252* 217* 215*    Imaging Dg Chest Port 1 View  Result Date: 10/13/2017 CLINICAL DATA:  Oxygen desaturation. EXAM: PORTABLE CHEST 1 VIEW COMPARISON:  Radiograph yesterday at 0359 hour FINDINGS: Tracheostomy tube at the thoracic inlet. Enteric tube in place, tip below the diaphragm not included in the field of view. Right internal jugular sheath with tip projecting over the proximal SVC, unchanged. Left internal jugular central venous catheter with tip projecting over the proximal SVC, unchanged. Worsening right  lung base aeration with increasing patchy and hazy opacity. Cardiomegaly and vascular congestion are unchanged. Left lung base atelectasis is unchanged. No pneumothorax. IMPRESSION: 1. Worsening patchy and hazy opacity at the right lung base may represent developing pleural effusion and atelectasis or airspace disease. 2. Cardiomegaly and vascular congestion are again seen. 3. Support apparatus are unchanged. Electronically Signed   By: Rubye Oaks M.D.   On: 10/13/2017 05:24    STUDIES:  Echo 2/25 >> EF 60 to 65%, mild MR Renal u/s 2/28 >> slight fullness of renal collecting system  CULTURES: Respiratory viral panel 2/21 >>  Influenza A H3 Blood 2/21 >> negative Sputum 2/22 >> oral flora Urine 2/26 >> negative Sputum 2/26 >> oral flora Blood 2/27 >> negative  ANTIBIOTICS: Vancomycin 2/21 >>2/22 Cefepime 2/21 >> 2/28 Tamiflu 2/21 >> 2/26 Fluconazole 2/21 >> 2/27 Anidulafungin 2/27 >> Zithromax 2/27 >> 3/1  SIGNIFICANT EVENTS: 2/21 Admit 2/22 VDRF 2/27 ID consulted for persistent fever 2/28 Renal consult for AKI, Cardiology consult for A fib with RVR 3/03 Attempted HD 3/04 Changed to CRRT 3/05 Worsening hypoxia >> vent settings adjusted  LINES/TUBES: ETT 2/22 >> 3/1 Left IJ 2/27 >>> Trach 3/01 >>> R IJ HD cath 3/03 >>>  DISCUSSION: 51yom with acute respiratory failure r/t flu/ARDS/PNA with superimposed AECOPD c/b AFib with RVR, AKI and marked volume overload.   ASSESSMENT / PLAN:  Acute hypoxic/hypercapnic respiratory failure from Influenza PNA. Hx of COPD with emphysema. - full vent support - f/u CXR - duoneb q6h with prn albuterol - d/c solumedrol and change to prednisone 30 mg daily on 3/05 >> wean off as able  A fib with RVR. Acute on chronic diastolic CHF with hx of HTN. - continue amiodarone, cardizem - transition from heparin to coumadin per pharmacy  Acute renal failure 2nd to ATN. Hyperkalemia. - CRRT per renal >> goal negative fluid balance  Anemia of critical illness and chronic disease. - f/u CBC  Fever >> no obvious source of infection currently. Esophageal candidiasis. Hx of HIV. - eraxis until 3/06 per ID - monitor off Abx - continue emtriva, tivicay, tenofivir  DM type II. - SSI with lantus  Acute metabolic encephalopathy. - change klonopin to 0.5 mg bid - RASS goal 0 to -1  DVT prophylaxis - heparin gtt with transition to coumadin SUP - pepcid Nutrition - tube feeds Goals of care - full code  App CCT 30 min  Brett Canales Minor ACNP [facility] PCCM Pager [phone] till 1 pm If no answer page [phone] 10/13/2017, 8:41 AM  Attending  documentation: Remains on full vent support and CRRT.  BP (!) 171/69   Pulse 80   Temp 98.6 F (37 C)   Resp 15   Ht 5\' 9"  (1.753 m)   Wt 270 lb 1 oz (122.5 kg)   SpO2 95%   BMI 39.88 kg/m   Not following commands.  Trach site clean.  HR regular.  B/l rhonchi.  Abdomen soft.  1+ edema.  A/P:  Acute respiratory failure. - full vent support   A fib. - change to coumadin  AKI from ATN. - continue volume removal with CRRT  CC time by me independent of APP time 33 minutes  Coralyn Helling, MD [facility] Pulmonary/Critical Care 10/13/2017, 10:28 AM Pager:  [phone] After 3pm call: [phone]

## 2017-10-13 NOTE — Progress Notes (Addendum)
[facility]   Reason for visit: Follow up on HIV, fever  Interval History: continues to have fever but no new source identified; Tmax 100.2 yesterday am; WBC 16.2 Day 7 anidulafungin  Physical Exam: Constitutional:  Vitals:   10/13/17 0845 10/13/17 0900  BP: (!) 173/69 (!) 176/69  Pulse: 80 83  Resp: 18 17  Temp: 98.6 F (37 C) 98.6 F (37 C)  SpO2: 94% 94%   patient is not responsive Eyes: anicteric, open HENT: + trach Respiratory: respiratory effort on vent; CTA B Cardiovascular: RRR, + SEM GI: soft, nt, nd  Review of Systems: Unable to be assessed due to mental status  Lab Results  Component Value Date   WBC 16.2 (H) 10/13/2017   HGB 8.5 (L) 10/13/2017   HCT 27.0 (L) 10/13/2017   MCV 90.6 10/13/2017   PLT 213 10/13/2017    Lab Results  Component Value Date   CREATININE 4.13 (H) 10/13/2017   BUN 113 (H) 10/13/2017   NA 135 10/13/2017   K 5.5 (H) 10/13/2017   CL 99 (L) 10/13/2017   CO2 23 10/13/2017    Lab Results  Component Value Date   ALT 18 10/02/2017   AST 23 10/02/2017   ALKPHOS 44 10/02/2017     Microbiology: Recent Results (from the past 240 hour(s))  Urine Culture     Status: None   Collection Time: 10/06/17 10:38 AM  Result Value Ref Range Status   Specimen Description URINE, CATHETERIZED  Final   Special Requests NONE  Final   Culture   Final    NO GROWTH Performed at [facility], [address] [place], [place] [address]    Report Status 10/07/2017 FINAL  Final  Culture, respiratory (NON-Expectorated)     Status: None   Collection Time: 10/06/17 12:27 PM  Result Value Ref Range Status   Specimen Description TRACHEAL ASPIRATE  Final   Special Requests NONE  Final   Gram Stain   Final    MODERATE WBC PRESENT,BOTH PMN AND MONONUCLEAR NO ORGANISMS SEEN    Culture   Final    MODERATE Consistent with normal respiratory flora. Performed at [facility], [address], [place], [place]  [address]    Report Status 10/08/2017 FINAL  Final  Culture, blood (routine x 2)     Status: None   Collection Time: 10/07/17 11:00 AM  Result Value Ref Range Status   Specimen Description BLOOD RIGHT ANTECUBITAL  Final   Special Requests   Final    BOTTLES DRAWN AEROBIC AND ANAEROBIC Blood Culture adequate volume   Culture   Final    NO GROWTH 5 DAYS Performed at [facility], [address] [place], [place] [address]    Report Status 10/12/2017 FINAL  Final  Culture, blood (routine x 2)     Status: None   Collection Time: 10/07/17 11:15 AM  Result Value Ref Range Status   Specimen Description BLOOD RIGHT HAND  Final   Special Requests   Final    BOTTLES DRAWN AEROBIC AND ANAEROBIC Blood Culture adequate volume   Culture   Final    NO GROWTH 5 DAYS Performed at [facility], [address], [place], [place] [address]    Report Status 10/12/2017 FINAL  Final    Impression/Plan:  1. HIV -continues on renally dosed regimen and will continue.   2.  Fever - no new source identified and decreased  fever curve.  No significant changes in vent, CXR personally reviewed and some patchy opacities.    3.  Esophageal candida - on Eraxis day 7.  Will complete today

## 2017-10-13 NOTE — Progress Notes (Signed)
Patient not maintaining oxygen saturation of 88% or higher.  Respiratory therapist aware.  Will continue to monitor.

## 2017-10-13 NOTE — Progress Notes (Signed)
Patient continues to desaturate below ordered level.  MD aware.  Will continue to monitor.

## 2017-10-14 ENCOUNTER — Inpatient Hospital Stay (HOSPITAL_COMMUNITY): Payer: Medicare HMO

## 2017-10-14 ENCOUNTER — Encounter (HOSPITAL_BASED_OUTPATIENT_CLINIC_OR_DEPARTMENT_OTHER): Payer: Medicare HMO

## 2017-10-14 DIAGNOSIS — R140 Abdominal distension (gaseous): Secondary | ICD-10-CM

## 2017-10-14 DIAGNOSIS — D72829 Elevated white blood cell count, unspecified: Secondary | ICD-10-CM

## 2017-10-14 LAB — CBC
HEMATOCRIT: 29.1 % — AB (ref 39.0–52.0)
Hemoglobin: 9 g/dL — ABNORMAL LOW (ref 13.0–17.0)
MCH: 28.7 pg (ref 26.0–34.0)
MCHC: 30.9 g/dL (ref 30.0–36.0)
MCV: 92.7 fL (ref 78.0–100.0)
Platelets: 245 10*3/uL (ref 150–400)
RBC: 3.14 MIL/uL — ABNORMAL LOW (ref 4.22–5.81)
RDW: 13.7 % (ref 11.5–15.5)
WBC: 25.1 10*3/uL — ABNORMAL HIGH (ref 4.0–10.5)

## 2017-10-14 LAB — RENAL FUNCTION PANEL
ANION GAP: 13 (ref 5–15)
Albumin: 2.3 g/dL — ABNORMAL LOW (ref 3.5–5.0)
Albumin: 2.3 g/dL — ABNORMAL LOW (ref 3.5–5.0)
Anion gap: 10 (ref 5–15)
BUN: 79 mg/dL — ABNORMAL HIGH (ref 6–20)
BUN: 71 mg/dL — ABNORMAL HIGH (ref 6–20)
CO2: 25 mmol/L (ref 22–32)
CO2: 24 mmol/L (ref 22–32)
CREATININE: 2.89 mg/dL — AB (ref 0.61–1.24)
Calcium: 8.7 mg/dL — ABNORMAL LOW (ref 8.9–10.3)
Calcium: 8.8 mg/dL — ABNORMAL LOW (ref 8.9–10.3)
Chloride: 98 mmol/L — ABNORMAL LOW (ref 101–111)
Chloride: 96 mmol/L — ABNORMAL LOW (ref 101–111)
Creatinine, Ser: 2.54 mg/dL — ABNORMAL HIGH (ref 0.61–1.24)
GFR calc Af Amer: 32 mL/min — ABNORMAL LOW (ref >60)
GFR calc non Af Amer: 28 mL/min — ABNORMAL LOW (ref >60)
GFR, EST AFRICAN AMERICAN: 27 mL/min — AB (ref >60)
GFR, EST NON AFRICAN AMERICAN: 24 mL/min — AB (ref >60)
GLUCOSE: 190 mg/dL — AB (ref 65–99)
Glucose, Bld: 182 mg/dL — ABNORMAL HIGH (ref 65–99)
POTASSIUM: 6.1 mmol/L — AB (ref 3.5–5.1)
Phosphorus: 6.5 mg/dL — ABNORMAL HIGH (ref 2.5–4.6)
Phosphorus: 7.4 mg/dL — ABNORMAL HIGH (ref 2.5–4.6)
Potassium: 5.7 mmol/L — ABNORMAL HIGH (ref 3.5–5.1)
SODIUM: 133 mmol/L — AB (ref 135–145)
Sodium: 133 mmol/L — ABNORMAL LOW (ref 135–145)

## 2017-10-14 LAB — URINALYSIS, ROUTINE W REFLEX MICROSCOPIC
BILIRUBIN URINE: NEGATIVE
Glucose, UA: 50 mg/dL — AB
Ketones, ur: NEGATIVE mg/dL
Leukocytes, UA: NEGATIVE
NITRITE: NEGATIVE
Protein, ur: 100 mg/dL — AB
SPECIFIC GRAVITY, URINE: 1.015 (ref 1.005–1.030)
Squamous Epithelial / LPF: NONE SEEN
pH: 5 (ref 5.0–8.0)

## 2017-10-14 LAB — GLUCOSE, CAPILLARY
GLUCOSE-CAPILLARY: 124 mg/dL — AB (ref 65–99)
GLUCOSE-CAPILLARY: 182 mg/dL — AB (ref 65–99)
Glucose-Capillary: 170 mg/dL — ABNORMAL HIGH (ref 65–99)
Glucose-Capillary: 123 mg/dL — ABNORMAL HIGH (ref 65–99)
Glucose-Capillary: 178 mg/dL — ABNORMAL HIGH (ref 65–99)

## 2017-10-14 LAB — MAGNESIUM: Magnesium: 3 mg/dL — ABNORMAL HIGH (ref 1.7–2.4)

## 2017-10-14 LAB — HEPARIN LEVEL (UNFRACTIONATED): HEPARIN UNFRACTIONATED: 0.7 [IU]/mL (ref 0.30–0.70)

## 2017-10-14 LAB — PROTIME-INR
INR: 1.1
Prothrombin Time: 14.1 seconds (ref 11.4–15.2)

## 2017-10-14 LAB — PHOSPHORUS: PHOSPHORUS: 6.5 mg/dL — AB (ref 2.5–4.6)

## 2017-10-14 LAB — PROCALCITONIN: Procalcitonin: 11.32 ng/mL

## 2017-10-14 MED ORDER — POLYETHYLENE GLYCOL 3350 17 G PO PACK
17.0000 g | PACK | Freq: Every day | ORAL | Status: DC
  Administered 2017-10-14 – 2017-10-30 (×11): 17 g via ORAL
  Filled 2017-10-14 (×17): qty 1

## 2017-10-14 MED ORDER — SODIUM CHLORIDE 0.9 % IV SOLN
2500.0000 mg | Freq: Once | INTRAVENOUS | Status: AC
  Administered 2017-10-14: 2500 mg via INTRAVENOUS
  Filled 2017-10-14: qty 2500

## 2017-10-14 MED ORDER — PRISMASOL BGK 0/2.5 32-2.5 MEQ/L IV SOLN
INTRAVENOUS | Status: DC
  Administered 2017-10-14 – 2017-10-15 (×4): via INTRAVENOUS_CENTRAL
  Filled 2017-10-14 (×3): qty 5000

## 2017-10-14 MED ORDER — PRISMASOL BGK 0/2.5 32-2.5 MEQ/L IV SOLN
INTRAVENOUS | Status: DC
  Administered 2017-10-14 (×2): via INTRAVENOUS_CENTRAL
  Filled 2017-10-14 (×5): qty 5000

## 2017-10-14 MED ORDER — PRISMASOL BGK 0/2.5 32-2.5 MEQ/L IV SOLN
INTRAVENOUS | Status: DC
  Administered 2017-10-14 – 2017-10-16 (×15): via INTRAVENOUS_CENTRAL
  Filled 2017-10-14 (×19): qty 5000

## 2017-10-14 MED ORDER — WARFARIN SODIUM 10 MG PO TABS
10.0000 mg | ORAL_TABLET | Freq: Once | ORAL | Status: AC
  Administered 2017-10-14: 10 mg via ORAL
  Filled 2017-10-14: qty 1

## 2017-10-14 MED ORDER — INSULIN ASPART 100 UNIT/ML ~~LOC~~ SOLN
5.0000 [IU] | SUBCUTANEOUS | Status: DC
  Administered 2017-10-14 – 2017-10-23 (×51): 5 [IU] via SUBCUTANEOUS

## 2017-10-14 MED ORDER — PIPERACILLIN-TAZOBACTAM 3.375 G IVPB
3.3750 g | Freq: Four times a day (QID) | INTRAVENOUS | Status: DC
  Administered 2017-10-14 – 2017-10-16 (×8): 3.375 g via INTRAVENOUS
  Filled 2017-10-14 (×9): qty 50

## 2017-10-14 MED ORDER — COUMADIN BOOK
Freq: Once | Status: AC
Start: 2017-10-14 — End: 2017-10-14
  Administered 2017-10-14: 09:00:00
  Filled 2017-10-14: qty 1

## 2017-10-14 MED ORDER — VANCOMYCIN HCL IN DEXTROSE 1-5 GM/200ML-% IV SOLN
1000.0000 mg | INTRAVENOUS | Status: DC
  Administered 2017-10-15: 1000 mg via INTRAVENOUS
  Filled 2017-10-14: qty 200

## 2017-10-14 MED ORDER — DILTIAZEM 12 MG/ML ORAL SUSPENSION
60.0000 mg | Freq: Four times a day (QID) | ORAL | Status: DC
  Administered 2017-10-14 – 2017-10-30 (×64): 60 mg
  Filled 2017-10-14 (×68): qty 6

## 2017-10-14 MED ORDER — PIPERACILLIN-TAZOBACTAM 3.375 G IVPB
3.3750 g | Freq: Three times a day (TID) | INTRAVENOUS | Status: DC
  Filled 2017-10-14 (×2): qty 50

## 2017-10-14 MED ORDER — PREDNISONE 20 MG PO TABS
20.0000 mg | ORAL_TABLET | Freq: Every day | ORAL | Status: DC
  Administered 2017-10-15 – 2017-10-17 (×3): 20 mg
  Filled 2017-10-14 (×3): qty 1

## 2017-10-14 NOTE — Progress Notes (Addendum)
Dr Juel BurrowLin paged with K+ 6.1 results .Dr Juel BurrowLIn states he will change Dialysis Fluids

## 2017-10-14 NOTE — Progress Notes (Signed)
Dr Juel BurrowLin called again about K+6.1

## 2017-10-14 NOTE — Progress Notes (Addendum)
Pharmacy Consult Note  Pharmacy Consult for heparin/warfarin bridge, vancomycin and zosyn Indication: pulmonary embolus/Afib; HCAP  Allergies  Allergen Reactions  . Bactrim [Sulfamethoxazole-Trimethoprim] Hives    Patient Measurements: Height: '5\' 9"'  (175.3 cm) Weight: 264 lb 1.8 oz (119.8 kg) IBW/kg (Calculated) : 70.7 Heparin Dosing Weight: 98 kg  Vital Signs: Temp: 98.3 F (36.8 C) (03/06 0742) Temp Source: Oral (03/06 0742) BP: 161/72 (03/06 0730) Pulse Rate: 79 (03/06 0745)  Height: '5\' 9"'  (175.3 cm) Weight: 264 lb 1.8 oz (119.8 kg) IBW/kg (Calculated) : 70.7  Temp (24hrs), Avg:98.9 F (37.2 C), Min:98.3 F (36.8 C), Max:100.6 F (38.1 C)  Recent Labs  Lab 10/10/17 0211  10/11/17 0434 10/12/17 0450 10/12/17 1543 10/13/17 0254 10/13/17 1504 10/13/17 2138 10/14/17 0316 10/14/17 0317  WBC 10.1  --  10.8* 13.4*  --  16.2*  --   --   --  25.1*  CREATININE 4.18*   < > 5.77* 6.02* 5.09* 4.13* 3.31* 3.11* 2.89*  --    < > = values in this interval not displayed.    Estimated Creatinine Clearance: 38.6 mL/min (A) (by C-G formula based on SCr of 2.89 mg/dL (H)).    Recent Labs    10/12/17 0450 10/12/17 1300  10/13/17 0252 10/13/17 0254 10/13/17 1504 10/13/17 2138 10/14/17 0316 10/14/17 0317  HGB 9.2*  --   --   --  8.5*  --   --   --  9.0*  HCT 29.6*  --   --   --  27.0*  --   --   --  29.1*  PLT 207  --   --   --  213  --   --   --  245  LABPROT  --   --   --   --   --   --   --   --  14.1  INR  --   --   --   --   --   --   --   --  1.10  HEPARINUNFRC 0.21* 0.41  --  0.45  --   --   --  0.70  --   CREATININE 6.02*  --    < >  --  4.13* 3.31* 3.11* 2.89*  --    < > = values in this interval not displayed.    Estimated Creatinine Clearance: 38.6 mL/min (A) (by C-G formula based on SCr of 2.89 mg/dL (H)).   Medications:  Scheduled:  . amiodarone  200 mg Per Tube BID   Followed by  . [START ON 10/18/2017] amiodarone  200 mg Per Tube Daily  .  chlorhexidine gluconate (MEDLINE KIT)  15 mL Mouth Rinse BID  . Chlorhexidine Gluconate Cloth  6 each Topical Daily  . clonazePAM  0.5 mg Per Tube BID  . diltiazem  60 mg Per Tube Q6H  . docusate  100 mg Per Tube BID  . dolutegravir  50 mg Oral Daily  . [START ON 10/16/2017] emtricitabine  200 mg Oral Once every 4 days  . famotidine  20 mg Per Tube QHS  . feeding supplement (VITAL HIGH PROTEIN)  1,000 mL Per Tube Q24H  . free water  100 mL Per Tube Q4H  . insulin aspart  0-20 Units Subcutaneous Q4H  . insulin aspart  5 Units Subcutaneous Q4H  . insulin glargine  35 Units Subcutaneous BID  . ipratropium-albuterol  3 mL Nebulization Q6H  . mouth rinse  15 mL Mouth Rinse 10 times  per day  . polyethylene glycol  17 g Oral Daily  . [START ON 10/15/2017] predniSONE  20 mg Per Tube Q breakfast  . sennosides  5 mL Per Tube QHS  . sodium chloride flush  10-40 mL Intracatheter Q12H  . sodium chloride flush  3 mL Intravenous Q12H  . tenofovir  300 mg Oral Q Wed  . warfarin  10 mg Oral ONCE-1800  . Warfarin - Pharmacist Dosing Inpatient   Does not apply q1800   Infusions:  . sodium chloride 250 mL (10/12/17 1200)  . sodium chloride    . sodium chloride    . fentaNYL infusion INTRAVENOUS 400 mcg/hr (10/14/17 0006)  . heparin 2,800 Units/hr (10/14/17 0602)  . heparin    . piperacillin-tazobactam (ZOSYN)  IV    . dialysis replacement fluid (prismasate) 500 mL/hr at 10/14/17 0111  . dialysis replacement fluid (prismasate) 500 mL/hr at 10/14/17 0913  . dialysate (PRISMASATE) 2,000 mL/hr at 10/14/17 0912  . vancomycin 2,500 mg (10/14/17 0918)  . [START ON 10/15/2017] vancomycin      Goal of Therapy:  Heparin level 0.3-0.7 units/ml Monitor platelets by anticoagulation protocol: Yes Vancomycin trough 15 -20 mg/L  Assessment/Plan  1. Warfarin/Heparin 36 YOM with Afib on Eliquis PTA, now with new PE this admission, switched to heparin, now to start warfarin given Eliquis failure. Of note patient  is currently on CRRT and receiving some heparin through that modality. Today heparin level 0.70, at high end of goal, INR is subtherapeutic 1.1. CBC stable, no bleeding noted.  -Decrease heparin to 2800 units/hr -Start warfarin 10 mg PO x 1 tonight. -Daily heparin level, INR and CBC -Monitor s/sx of bleeding   2. Vancomycin/Zosyn for suspected HCAP Patient with well controlled HIV has completed treatment for PNA/influenza and esophageal candidiadis (Eraxis finished yesterday). He has had persistent low-grade fevers throughout. He also suffered from AKI and started CRRT on 3/4 - creatinine is improving but urine output is declining. Overnight, Tmax was 100.6, WBC increased from 16>>25.1, and developed increased secretions. Pharmacy is now consulted to start vancomycin and zosyn for possible HCAP recurrence.  -Start Zosyn 3.375 gram IV q 6 while on CRRT -Start vancomycin 2500 mg IV bolus x 1, followed by 1000 mg IV every 24 hours while on CRRT -Vancomycin trough as indicated -Monitor renal function/nephrology plans for CRRT/HD -F/u cultures, clinical progress, de-escalation plans    Antimicrobials this admission: 2/21 Vanc>>2/22 2/21 Cefepime >>>2/28 2/21 Tamilflu >>2/26 2/21 Diflucan>>2/27 2/27 Azith >>3/1 2/27 Eraxis>>(3/6) 3/6 Vanc >> 3/6 Zosyn >>  Microbiology results: 2/21 RVP + influenza A 2/21 BCx: Neg 2/21 MRSA PCR: Negative  2/22 TA >> Normal respiratory flora 2/26 Urine:Neg 2/26 Respiratory: Negative 2/27 BCx - NGTD 3/5 Resp cx: rare GPC, GVR 3/6 Blood cx 3/6 Resp cx: 3/6 Urine cx:    Thank you for allowing pharmacy to be a part of this patient's care.  Charlene Brooke, PharmD PGY1 Pharmacy Resident Pager: [phone] After 4:00PM please call Main Pharmacy [phone] 10/14/2017,9:19 AM

## 2017-10-14 NOTE — Progress Notes (Signed)
Patient potassium resulted K 5.9.  Paged renal Dr. Kathrene BongoGoldsborough regarding increase in K.  Dr. advised changing post dialysate to 0 & 2.5.  Ordered modified per telephone verbal ordered.  Flow rates remain the same.  Pharmacy aware.

## 2017-10-14 NOTE — Progress Notes (Addendum)
[facility]   Reason for visit: Follow up on HIV, fever  Interval History:  Tmax 100.6 yesterday; WBC up to 25.1; increased secretions.  Completed anidulafungin  Physical Exam: Constitutional:  Vitals:   10/14/17 0819 10/14/17 0821  BP:    Pulse:    Resp:    Temp:    SpO2: 93% 92%   patient is not responsive Eyes: anicteric, open HENT: + trach Respiratory: respiratory effort on vent; CTA B Cardiovascular: RRR, + SEM GI: soft, + distended,   Review of Systems: Unable to be assessed due to mental status  Lab Results  Component Value Date   WBC 25.1 (H) 10/14/2017   HGB 9.0 (L) 10/14/2017   HCT 29.1 (L) 10/14/2017   MCV 92.7 10/14/2017   PLT 245 10/14/2017    Lab Results  Component Value Date   CREATININE 2.89 (H) 10/14/2017   BUN 79 (H) 10/14/2017   NA 133 (L) 10/14/2017   K 5.7 (H) 10/14/2017   CL 98 (L) 10/14/2017   CO2 25 10/14/2017    Lab Results  Component Value Date   ALT 18 10/02/2017   AST 23 10/02/2017   ALKPHOS 44 10/02/2017     Microbiology: Recent Results (from the past 240 hour(s))  Urine Culture     Status: None   Collection Time: 10/06/17 10:38 AM  Result Value Ref Range Status   Specimen Description URINE, CATHETERIZED  Final   Special Requests NONE  Final   Culture   Final    NO GROWTH Performed at [facility], [address] [place], [place] [address]    Report Status 10/07/2017 FINAL  Final  Culture, respiratory (NON-Expectorated)     Status: None   Collection Time: 10/06/17 12:27 PM  Result Value Ref Range Status   Specimen Description TRACHEAL ASPIRATE  Final   Special Requests NONE  Final   Gram Stain   Final    MODERATE WBC PRESENT,BOTH PMN AND MONONUCLEAR NO ORGANISMS SEEN    Culture   Final    MODERATE Consistent with normal respiratory flora. Performed at [facility], [address] [place], [place] [address]    Report Status 10/08/2017 FINAL  Final  Culture, blood  (routine x 2)     Status: None   Collection Time: 10/07/17 11:00 AM  Result Value Ref Range Status   Specimen Description BLOOD RIGHT ANTECUBITAL  Final   Special Requests   Final    BOTTLES DRAWN AEROBIC AND ANAEROBIC Blood Culture adequate volume   Culture   Final    NO GROWTH 5 DAYS Performed at [facility], [address] [place], [place] [address]    Report Status 10/12/2017 FINAL  Final  Culture, blood (routine x 2)     Status: None   Collection Time: 10/07/17 11:15 AM  Result Value Ref Range Status   Specimen Description BLOOD RIGHT HAND  Final   Special Requests   Final    BOTTLES DRAWN AEROBIC AND ANAEROBIC Blood Culture adequate volume   Culture   Final    NO GROWTH 5 DAYS Performed at [facility], [address] [place], [place] [address]    Report Status 10/12/2017 FINAL  Final  Culture, respiratory (NON-Expectorated)     Status: None (Preliminary result)   Collection Time: 10/13/17 10:06 AM  Result Value Ref Range Status   Specimen Description TRACHEAL ASPIRATE  Final   Special Requests NONE  Final   Gram Stain   Final    RARE WBC PRESENT, PREDOMINANTLY PMN RARE GRAM POSITIVE COCCI RARE GRAM VARIABLE ROD Performed at [facility], [address] [place], [place] [address]    Culture PENDING  Incomplete   Report Status PENDING  Incomplete    Impression/Plan:  1. HIV -continues on renally dosed regimen and will continue.   2.  Fever - becoming more febrile and increased WBC, despite less steroids.  Possible pneumonia with increased secretions, though CXR, personally reviewed, without much opacity and improved compared to yesterday.  Zosyn being started.    3.  abdominal distension - KUB and evaluation per CCM.  Soft and no concerns on exam but with fever, leukocytosis, could be site of infection.

## 2017-10-14 NOTE — Progress Notes (Signed)
[facility] Rounding Note  Subjective/Interval History:   HD 3/3; CRRT started 3/4  2/2 hyperkalemia, marked azotemia, high O2 requirement Pre 500 Post 500 (and changed to 0K 3/5 2/2 progressive hyperkalemia)  DFR 2000 Effluent dose 20 ml/kg/hour Temp cath 3/3 R IJ  Continues with fevers, finishing Eraxis Source ?  Very catabolic, hyperkalemia necessitating change in post replacement fluids to 0K  Objective Vital signs in last 24 hours: Vitals:   10/14/17 0715 10/14/17 0730 10/14/17 0742 10/14/17 0745  BP:  (!) 161/72    Pulse: 78 79  79  Resp: '17 20  16  ' Temp:   98.3 F (36.8 C)   TempSrc:   Oral   SpO2: 92% 93%  92%  Weight:      Height:       Weight change: -2.7 kg (-15.2 oz)  Intake/Output Summary (Last 24 hours) at 10/14/2017 0818 Last data filed at 10/14/2017 0700 Gross per 24 hour  Intake 3334.03 ml  Output 7399 ml  Net -4064.97 ml   Physical Exam:  Blood pressure (!) 161/72, pulse 79, temperature 98.3 F (36.8 C), temperature source Oral, resp. rate 16, height '5\' 9"'  (1.753 m), weight 119.8 kg (264 lb 1.8 oz), SpO2 92 %.  Trached Eyes open, not following commands, no spont movements Generalized anasarca no real change yet pitting post thighs R IJ temp HD cath (3/3) Coarse rhonchous breath sounds Tachy S1S2 NoS3 Obese abdomen, + BS Tighter past 24 hours SCD's in place  Weight trending                                10/15/15  119.8 kg-   10/13/17  122.5 kg   10/12/17   130.8 kg    10/11/17   127.6 kg    10/10/17   126.1      Recent Labs  Lab 10/11/17 0434 10/12/17 0450 10/12/17 1543 10/13/17 0254 10/13/17 1504 10/13/17 2138 10/14/17 0316 10/14/17 0317  NA 140 137 136 135 135 134* 133*  --   K 5.6* 6.3* 5.5* 5.5* 5.7* 5.9* 5.7*  --   CL 105 98* 99* 99* 97* 98* 98*  --   CO2 21* 20* '22 23 23 24 25  ' --   GLUCOSE 241* 325* 262* 231* 181* 217* 182*  --   BUN 188* 170* 142* 113* 92* 84* 79*  --   CREATININE 5.77* 6.02* 5.09* 4.13* 3.31* 3.11* 2.89*   --   CALCIUM 8.0* 8.1* 8.0* 8.5* 8.6* 8.5* 8.7*  --   PHOS 8.4* 9.7* 8.0* 7.2* 6.5*  --  6.5* 6.5*    Recent Labs  Lab 10/13/17 0254 10/13/17 1504 10/14/17 0316  ALBUMIN 2.1* 2.1* 2.3*    Recent Labs  Lab 10/10/17 0211 10/11/17 0434 10/12/17 0450 10/13/17 0254 10/14/17 0317  WBC 10.1 10.8* 13.4* 16.2* 25.1*  NEUTROABS 8.2* 8.5* 12.4* 15.6*  --   HGB 9.8* 9.4* 9.2* 8.5* 9.0*  HCT 33.1* 30.6* 29.6* 27.0* 29.1*  MCV 93.2 92.7 91.4 90.6 92.7  PLT 111* 150 207 213 245    Recent Labs  Lab 10/08/17 1000 10/08/17 1622 10/08/17 2140 10/09/17 0355  TROPONINI 0.03* 0.03* 0.04* 0.05*    Recent Labs  Lab 10/13/17 1529 10/13/17 1940 10/13/17 2354 10/14/17 0350 10/14/17 0741  GLUCAP 160* 167* 247* 170* 124*    Studies/Results: Dg Chest Port 1 View  Result Date: 10/14/2017 CLINICAL DATA:  Respiratory failure, endotracheal tube. EXAM:  PORTABLE CHEST 1 VIEW COMPARISON:  10/13/2017 and CT chest 07/23/2017. FINDINGS: Tracheostomy is midline. Right IJ central line tip projects at the junction of the brachiocephalic veins. Left IJ central line tip is likely in the upper SVC. Feeding tube is followed into the stomach with the tip projecting beyond the inferior boundary of the image. Heart size stable. There is a fine pattern of interstitial prominence bilaterally, likely due to emphysema when compared with 07/23/2017. Bibasilar aeration has improved from yesterday, however. There may be trace right pleural fluid. IMPRESSION: 1. Improving bibasilar aeration with residual airspace opacification, possibly due to atelectasis. Pneumonia is not excluded. 2. Probable trace right pleural effusion. Electronically Signed   By: Lorin Picket M.D.   On: 10/14/2017 05:49   Dg Chest Port 1 View  Result Date: 10/13/2017 CLINICAL DATA:  Oxygen desaturation. EXAM: PORTABLE CHEST 1 VIEW COMPARISON:  Radiograph yesterday at 0359 hour FINDINGS: Tracheostomy tube at the thoracic inlet. Enteric tube in  place, tip below the diaphragm not included in the field of view. Right internal jugular sheath with tip projecting over the proximal SVC, unchanged. Left internal jugular central venous catheter with tip projecting over the proximal SVC, unchanged. Worsening right lung base aeration with increasing patchy and hazy opacity. Cardiomegaly and vascular congestion are unchanged. Left lung base atelectasis is unchanged. No pneumothorax. IMPRESSION: 1. Worsening patchy and hazy opacity at the right lung base may represent developing pleural effusion and atelectasis or airspace disease. 2. Cardiomegaly and vascular congestion are again seen. 3. Support apparatus are unchanged. Electronically Signed   By: Jeb Levering M.D.   On: 10/13/2017 05:24   Medications: . sodium chloride 250 mL (10/12/17 1200)  . sodium chloride    . sodium chloride    . fentaNYL infusion INTRAVENOUS 400 mcg/hr (10/14/17 0006)  . heparin 2,800 Units/hr (10/14/17 0602)  . heparin    . dialysis replacement fluid (prismasate) 500 mL/hr at 10/14/17 0111  . dialysis replacement fluid (prismasate) 500 mL/hr at 10/13/17 2303  . dialysate (PRISMASATE) 2,000 mL/hr at 10/14/17 7253   Medications . amiodarone  200 mg Per Tube BID   Followed by  . [START ON 10/18/2017] amiodarone  200 mg Per Tube Daily  . chlorhexidine gluconate (MEDLINE KIT)  15 mL Mouth Rinse BID  . Chlorhexidine Gluconate Cloth  6 each Topical Daily  . clonazePAM  0.5 mg Per Tube BID  . coumadin book   Does not apply Once  . diltiazem  30 mg Per Tube Q6H  . docusate  100 mg Per Tube BID  . dolutegravir  50 mg Oral Daily  . [START ON 10/16/2017] emtricitabine  200 mg Oral Once every 4 days  . famotidine  20 mg Per Tube QHS  . feeding supplement (VITAL HIGH PROTEIN)  1,000 mL Per Tube Q24H  . free water  100 mL Per Tube Q4H  . insulin aspart  0-20 Units Subcutaneous Q4H  . insulin aspart  5 Units Subcutaneous Q4H  . insulin glargine  35 Units Subcutaneous BID  .  ipratropium-albuterol  3 mL Nebulization Q6H  . mouth rinse  15 mL Mouth Rinse 10 times per day  . predniSONE  30 mg Per Tube Q breakfast  . sennosides  5 mL Per Tube QHS  . sodium chloride flush  10-40 mL Intracatheter Q12H  . sodium chloride flush  3 mL Intravenous Q12H  . tenofovir  300 mg Oral Q Wed  . warfarin  10 mg Oral ONCE-1800  .  Warfarin - Pharmacist Dosing Inpatient   Does not apply q1800    Background: 52 yo AAM HIV AF COPD D< MO Admitted w/resp failure, VDRF. Initially + Flu A, PNA; possible PE;  Baseline creatinine 0.79 10/01/17. AKI in setting of above issues. HDX1 3/4 for volume, but markedly catabolic/recurrent [name], massively overloaded. CRRT initiated 10/12/17 for massive anasarca hyperkalemia. R IJ temp cath 3/3 by CCM.   Assessment/Recommendations  1. AKI - normal creatinine at baseline. Markedly catabolic. Anasarca. CRRT started 3/4. Pulling 150-200 cc/hour. Hyperkalemia despite high effluent dose. 0K fluids post filter. Trending K. Foley out. Daily bladder scan 2. VDRF/COPD/ARDS/FluA+ - finished Tamiflu/ATB's; on steroids. Per CCM 3. HIV -meds; ID following 4. Esophageal candidiasis - noted during intubation. IV antifungals done today 5. Fevers/progressiove leukocytosis. CCM addressing 6. AF - amio/heparin/low dose dilt 7. DM - per CCM 8. Encephalopathy - eyes open, no pain response per nursing. W/U per CCM.  Jamal Maes, MD [facility] [phone] pager 10/14/2017, 8:18 AM

## 2017-10-14 NOTE — Progress Notes (Signed)
PULMONARY / CRITICAL CARE MEDICINE   Name: Chris [name]. MRN: [id number] DOB: 1966-05-13    ADMISSION DATE:  10/01/2017 CONSULTATION DATE:  2/21  REFERRING MD:  Juleen China  CHIEF COMPLAINT:  SOB  HISTORY OF PRESENT ILLNESS:   52 yo male presented with dyspnea, cough, fever, hypoxia.  Found to have Influenza PNA causing ARDS.  Course complicated by hypervolemia and renal failure.  PMHx of HIV, A fib, COPD, DM.   SUBJECTIVE:  Dyssynchronous with vent.  Febrile overnight.  Remains hyperkalemic.  WBC trending up. Remains on 60% FiO2  VITAL SIGNS: BP (!) 161/72   Pulse 79   Temp 98.3 F (36.8 C) (Oral)   Resp 16   Ht 5\' 9"  (1.753 m)   Wt 119.8 kg (264 lb 1.8 oz)   SpO2 92%   BMI 39.00 kg/m   HEMODYNAMICS: CVP:  [10 mmHg-12 mmHg] 10 mmHg  VENTILATOR SETTINGS: Vent Mode: PRVC FiO2 (%):  [60 %] 60 % Set Rate:  [30 bmp] 30 bmp Vt Set:  [600 mL] 600 mL PEEP:  [10 cmH20] 10 cmH20 Plateau Pressure:  [21 cmH20-27 cmH20] 27 cmH20  INTAKE / OUTPUT: I/O last 3 completed shifts: In: 5072 [I.V.:1607; NG/GT:3380; IV Piggyback:85] Out: 13244 [Urine:125; Other:11046]  PHYSICAL EXAMINATION:  General: 52 year old male no response on vent HEENT: Trach with purulent secretions, mm moist Neuro: Does not arouse to voice follow commands CV: s1s2 rrr  PULM: resps even/non-labored, intermittently dyssynchronous with vent, coarse, diminished bases  GI: distended, taught, hypoactive bs  Extremities: warm/dry, 2+ edema  Skin: no rashes or lesions   LABS:  BMET Recent Labs  Lab 10/13/17 1504 10/13/17 2138 10/14/17 0316  NA 135 134* 133*  K 5.7* 5.9* 5.7*  CL 97* 98* 98*  CO2 23 24 25   BUN 92* 84* 79*  CREATININE 3.31* 3.11* 2.89*  GLUCOSE 181* 217* 182*   Electrolytes Recent Labs  Lab 10/12/17 0450  10/13/17 0254 10/13/17 1504 10/13/17 2138 10/14/17 0316 10/14/17 0317  CALCIUM 8.1*   < > 8.5* 8.6* 8.5* 8.7*  --   MG 3.1*  --  3.0*  --   --   --  3.0*  PHOS 9.7*   <  > 7.2* 6.5*  --  6.5* 6.5*   < > = values in this interval not displayed.   CBC Recent Labs  Lab 10/12/17 0450 10/13/17 0254 10/14/17 0317  WBC 13.4* 16.2* 25.1*  HGB 9.2* 8.5* 9.0*  HCT 29.6* 27.0* 29.1*  PLT 207 213 245   Coag's Recent Labs  Lab 10/09/17 0355 10/09/17 1247 10/09/17 2000 10/14/17 0317  APTT 60* 66* 68*  --   INR 1.20  --   --  1.10    Sepsis Markers No results for input(s): LATICACIDVEN, PROCALCITON, O2SATVEN in the last 168 hours.  ABG Recent Labs  Lab 10/11/17 2331 10/13/17 0550 10/13/17 0950  PHART 7.309* 7.261* 7.315*  PCO2ART 48.6* 57.9* 51.7*  PO2ART 68.0* 194* 78.3*    Liver Enzymes Recent Labs  Lab 10/13/17 0254 10/13/17 1504 10/14/17 0316  ALBUMIN 2.1* 2.1* 2.3*    Cardiac Enzymes Recent Labs  Lab 10/08/17 1622 10/08/17 2140 10/09/17 0355  TROPONINI 0.03* 0.04* 0.05*    Glucose Recent Labs  Lab 10/13/17 1149 10/13/17 1529 10/13/17 1940 10/13/17 2354 10/14/17 0350 10/14/17 0741  GLUCAP 173* 160* 167* 247* 170* 124*    Imaging Dg Chest Port 1 View  Result Date: 10/14/2017 CLINICAL DATA:  Respiratory failure, endotracheal tube. EXAM: PORTABLE CHEST  1 VIEW COMPARISON:  10/13/2017 and CT chest 07/23/2017. FINDINGS: Tracheostomy is midline. Right IJ central line tip projects at the junction of the brachiocephalic veins. Left IJ central line tip is likely in the upper SVC. Feeding tube is followed into the stomach with the tip projecting beyond the inferior boundary of the image. Heart size stable. There is a fine pattern of interstitial prominence bilaterally, likely due to emphysema when compared with 07/23/2017. Bibasilar aeration has improved from yesterday, however. There may be trace right pleural fluid. IMPRESSION: 1. Improving bibasilar aeration with residual airspace opacification, possibly due to atelectasis. Pneumonia is not excluded. 2. Probable trace right pleural effusion. Electronically Signed   By: Leanna BattlesMelinda   Blietz M.D.   On: 10/14/2017 05:49    STUDIES:  Echo 2/25 >> EF 60 to 65%, mild MR Renal u/s 2/28 >> slight fullness of renal collecting system  CULTURES: Respiratory viral panel 2/21 >> Influenza A H3 Blood 2/21 >> negative Sputum 2/22 >> oral flora Urine 2/26 >> negative Sputum 2/26 >> oral flora Blood 2/27 >> negative Sputum 3/5>>> BC x 2 3/5>>>  ANTIBIOTICS: Vancomycin 2/21 >>2/22 Cefepime 2/21 >> 2/28 Tamiflu 2/21 >> 2/26 Fluconazole 2/21 >> 2/27 Anidulafungin 2/27 >>3/6 Zithromax 2/27 >> 3/1  SIGNIFICANT EVENTS: 2/21 Admit 2/22 VDRF 2/27 ID consulted for persistent fever 2/28 Renal consult for AKI, Cardiology consult for A fib with RVR 3/03 Attempted HD 3/04 Changed to CRRT 3/05 Worsening hypoxia >> vent settings adjusted  LINES/TUBES: ETT 2/22 >> 3/1 Left IJ 2/27 >>> Trach 3/01 >>> R IJ HD cath 3/03 >>>  DISCUSSION: 51yom with acute respiratory failure r/t flu/ARDS/PNA with superimposed AECOPD c/b AFib with RVR, AKI and marked volume overload.   ASSESSMENT / PLAN:  Acute hypoxic/hypercapnic respiratory failure from Influenza PNA. Hx of COPD with emphysema. ?HCAP v purulent tracheobronchitis  P:  Vent support - 8cc/kg  F/u CXR  F/u ABG Pulmonary hygiene - using bed for percussion  Add abx 3/6 as below  Sputum culture pending  Continue prednisone and wean to off as able - decrease to 20mg /day 3/7    A fib with RVR. Acute on chronic diastolic CHF with hx of HTN. P:  continue amiodarone Increase diltiazem to 60 q 6 -- takes 360mg XR daily  transition from heparin to coumadin per pharmacy   Acute renal failure 2nd to ATN. Hyperkalemia. P:  CRRT per renal >> goal negative fluid balance   Anemia of critical illness and chronic disease. P:  f/u CBC Heparin/coumadin per pharmacy    Persistent Fever, WBC trending up  Esophageal candidiasis. Hx of HIV. P:  eraxis complete 3/06 per ID Add empiric broad spectrum abx for now with ongoing  fever, WBC jump, purulent secretions  continue emtriva, tivicay, tenofivir Pan culture  Check pct    abd distension P:  D/c flexiseal  Continue bowel regimen  Add miralax  KUB   DM type II. P  SSI with lantus and TF coverage - decrease TF coverage with weaning steroids    Acute metabolic encephalopathy. P:  klonopin 0.5 mg bid - may be able to wean further but still needing IV sedation for vent synchrony   RASS goal 0 to -1 Daily WUA    DVT prophylaxis - heparin gtt with transition to coumadin SUP - pepcid Nutrition - tube feeds Goals of care - full code -- need ongoing goals of care discussion with family.    No family at bedside 3/6.     Dirk DressKaty Whiteheart, NP  10/14/2017  8:52 AM Pager: [phone] or [phone]

## 2017-10-15 ENCOUNTER — Other Ambulatory Visit: Payer: Self-pay

## 2017-10-15 LAB — GLUCOSE, CAPILLARY
GLUCOSE-CAPILLARY: 182 mg/dL — AB (ref 65–99)
GLUCOSE-CAPILLARY: 249 mg/dL — AB (ref 65–99)
Glucose-Capillary: 148 mg/dL — ABNORMAL HIGH (ref 65–99)
Glucose-Capillary: 160 mg/dL — ABNORMAL HIGH (ref 65–99)
Glucose-Capillary: 177 mg/dL — ABNORMAL HIGH (ref 65–99)
Glucose-Capillary: 196 mg/dL — ABNORMAL HIGH (ref 65–99)
Glucose-Capillary: 221 mg/dL — ABNORMAL HIGH (ref 65–99)

## 2017-10-15 LAB — RENAL FUNCTION PANEL
ALBUMIN: 2.3 g/dL — AB (ref 3.5–5.0)
ANION GAP: 12 (ref 5–15)
Albumin: 2.2 g/dL — ABNORMAL LOW (ref 3.5–5.0)
Albumin: 2.3 g/dL — ABNORMAL LOW (ref 3.5–5.0)
Anion gap: 11 (ref 5–15)
Anion gap: 13 (ref 5–15)
BUN: 60 mg/dL — ABNORMAL HIGH (ref 6–20)
BUN: 66 mg/dL — AB (ref 6–20)
BUN: 68 mg/dL — AB (ref 6–20)
CALCIUM: 8.4 mg/dL — AB (ref 8.9–10.3)
CALCIUM: 8.4 mg/dL — AB (ref 8.9–10.3)
CHLORIDE: 97 mmol/L — AB (ref 101–111)
CO2: 24 mmol/L (ref 22–32)
CO2: 25 mmol/L (ref 22–32)
CO2: 25 mmol/L (ref 22–32)
CREATININE: 2.37 mg/dL — AB (ref 0.61–1.24)
Calcium: 8.5 mg/dL — ABNORMAL LOW (ref 8.9–10.3)
Chloride: 97 mmol/L — ABNORMAL LOW (ref 101–111)
Chloride: 95 mmol/L — ABNORMAL LOW (ref 101–111)
Creatinine, Ser: 2.24 mg/dL — ABNORMAL HIGH (ref 0.61–1.24)
Creatinine, Ser: 2.46 mg/dL — ABNORMAL HIGH (ref 0.61–1.24)
GFR calc Af Amer: 33 mL/min — ABNORMAL LOW (ref >60)
GFR calc Af Amer: 35 mL/min — ABNORMAL LOW (ref >60)
GFR calc non Af Amer: 30 mL/min — ABNORMAL LOW (ref >60)
GFR calc non Af Amer: 32 mL/min — ABNORMAL LOW (ref >60)
GFR, EST AFRICAN AMERICAN: 37 mL/min — AB (ref >60)
GFR, EST NON AFRICAN AMERICAN: 29 mL/min — AB (ref >60)
GLUCOSE: 194 mg/dL — AB (ref 65–99)
Glucose, Bld: 177 mg/dL — ABNORMAL HIGH (ref 65–99)
Glucose, Bld: 201 mg/dL — ABNORMAL HIGH (ref 65–99)
PHOSPHORUS: 7.2 mg/dL — AB (ref 2.5–4.6)
PHOSPHORUS: 6.8 mg/dL — AB (ref 2.5–4.6)
POTASSIUM: 5.6 mmol/L — AB (ref 3.5–5.1)
Phosphorus: 7.5 mg/dL — ABNORMAL HIGH (ref 2.5–4.6)
Potassium: 5.2 mmol/L — ABNORMAL HIGH (ref 3.5–5.1)
Potassium: 4.6 mmol/L (ref 3.5–5.1)
SODIUM: 134 mmol/L — AB (ref 135–145)
SODIUM: 132 mmol/L — AB (ref 135–145)
Sodium: 133 mmol/L — ABNORMAL LOW (ref 135–145)

## 2017-10-15 LAB — PROCALCITONIN: Procalcitonin: 6.91 ng/mL

## 2017-10-15 LAB — CBC
HEMATOCRIT: 28.7 % — AB (ref 39.0–52.0)
Hemoglobin: 8.8 g/dL — ABNORMAL LOW (ref 13.0–17.0)
MCH: 28.9 pg (ref 26.0–34.0)
MCHC: 30.7 g/dL (ref 30.0–36.0)
MCV: 94.4 fL (ref 78.0–100.0)
Platelets: 219 10*3/uL (ref 150–400)
RBC: 3.04 MIL/uL — ABNORMAL LOW (ref 4.22–5.81)
RDW: 14 % (ref 11.5–15.5)
WBC: 24.4 10*3/uL — ABNORMAL HIGH (ref 4.0–10.5)

## 2017-10-15 LAB — PROTIME-INR
INR: 1.17
Prothrombin Time: 14.8 seconds (ref 11.4–15.2)

## 2017-10-15 LAB — HEPARIN LEVEL (UNFRACTIONATED): Heparin Unfractionated: 0.35 IU/mL (ref 0.30–0.70)

## 2017-10-15 MED ORDER — PRISMASOL BGK 4/2.5 32-4-2.5 MEQ/L IV SOLN
INTRAVENOUS | Status: DC
  Administered 2017-10-15 – 2017-10-20 (×12): via INTRAVENOUS_CENTRAL
  Filled 2017-10-15 (×16): qty 5000

## 2017-10-15 MED ORDER — DEXMEDETOMIDINE HCL IN NACL 200 MCG/50ML IV SOLN
0.0000 ug/kg/h | INTRAVENOUS | Status: DC
  Administered 2017-10-15: 0.4 ug/kg/h via INTRAVENOUS
  Administered 2017-10-15: 0.5 ug/kg/h via INTRAVENOUS
  Filled 2017-10-15 (×2): qty 50

## 2017-10-15 MED ORDER — WARFARIN SODIUM 7.5 MG PO TABS
15.0000 mg | ORAL_TABLET | Freq: Once | ORAL | Status: AC
  Administered 2017-10-15: 15 mg via ORAL
  Filled 2017-10-15: qty 2

## 2017-10-15 MED ORDER — DEXMEDETOMIDINE HCL IN NACL 400 MCG/100ML IV SOLN
0.0000 ug/kg/h | INTRAVENOUS | Status: DC
  Administered 2017-10-15: 0.5 ug/kg/h via INTRAVENOUS
  Administered 2017-10-16 (×2): 1 ug/kg/h via INTRAVENOUS
  Administered 2017-10-16: 0.5 ug/kg/h via INTRAVENOUS
  Administered 2017-10-16: 0.6 ug/kg/h via INTRAVENOUS
  Administered 2017-10-17: 0.8 ug/kg/h via INTRAVENOUS
  Administered 2017-10-17: 1 ug/kg/h via INTRAVENOUS
  Administered 2017-10-17: 1.2 ug/kg/h via INTRAVENOUS
  Administered 2017-10-17: 0.8 ug/kg/h via INTRAVENOUS
  Administered 2017-10-17: 0.4 ug/kg/h via INTRAVENOUS
  Administered 2017-10-18: 1.2 ug/kg/h via INTRAVENOUS
  Administered 2017-10-18: 0.6 ug/kg/h via INTRAVENOUS
  Administered 2017-10-18: 0.8 ug/kg/h via INTRAVENOUS
  Administered 2017-10-18: 0.6 ug/kg/h via INTRAVENOUS
  Administered 2017-10-19: 0.8 ug/kg/h via INTRAVENOUS
  Administered 2017-10-19: 1 ug/kg/h via INTRAVENOUS
  Administered 2017-10-19: 0.6 ug/kg/h via INTRAVENOUS
  Administered 2017-10-19: 0.4 ug/kg/h via INTRAVENOUS
  Administered 2017-10-19: 0.8 ug/kg/h via INTRAVENOUS
  Administered 2017-10-20: 0.5 ug/kg/h via INTRAVENOUS
  Administered 2017-10-21: 0.6 ug/kg/h via INTRAVENOUS
  Administered 2017-10-21: 0.5 ug/kg/h via INTRAVENOUS
  Administered 2017-10-22 – 2017-10-23 (×6): 0.7 ug/kg/h via INTRAVENOUS
  Administered 2017-10-23: 0.5 ug/kg/h via INTRAVENOUS
  Filled 2017-10-15 (×30): qty 100

## 2017-10-15 MED ORDER — COUMADIN BOOK
Freq: Once | Status: DC
  Filled 2017-10-15: qty 1

## 2017-10-15 MED ORDER — PRISMASOL BGK 4/2.5 32-4-2.5 MEQ/L IV SOLN
INTRAVENOUS | Status: DC
Start: 2017-10-15 — End: 2017-10-21
  Administered 2017-10-15 – 2017-10-20 (×9): via INTRAVENOUS_CENTRAL
  Filled 2017-10-15 (×16): qty 5000

## 2017-10-15 NOTE — Progress Notes (Signed)
Nutrition Follow-up  DOCUMENTATION CODES:   Obesity unspecified  INTERVENTION:   Continue Vital HP @ 7365mL/hr x 24hr; this provides 1560kcal, 137g protein, and 1304mL free water.   NUTRITION DIAGNOSIS:   Inadequate oral intake related to inability to eat as evidenced by NPO status. Ongoing.  GOAL:   Provide needs based on ASPEN/SCCM guidelines Meeting.  MONITOR:   Vent status, TF tolerance, Labs, I & O's  ASSESSMENT:   52 yo male with PMH of COPD, smoker, HIV, DM, depression, GERD, A fib who was admitted on 2/21 with acute on chronic hypoxic respiratory failure, COPD exacerbation, influenza PNA. Pt developed ARDS and AKI with volume overload.  2/22 Vital HP initiated at trickle with prostat 2/28 Vital HP at 4265ml/hr, d/c prostat 3/1 Cortrak placed 3/3 attempted HD 3/4 CRRT initiated 3/5 worsening hypoxia, vent settings adjusted 3/7 D/C flexiseal  Discussed pt at rounds this morning. Pt receiving TF at goal; Vital HP @ 6965ml/hr x24 hours; provides 1560kcal, 137g protein, and 1304mL free water.  TF providing 100% of pt's calorie and 94% of protein needs.  Per chart review, pt abdomen distended but soft.  Pt remains of ventilator support at a high rate; receiving CRRT.  Discussed poor prognosis at pt rounds; Palliative consulted for GOC today. Currently full code.  Labs: Na (L;134), K(H;5.2), BUN (H;66), Cr(H;2.37), Corrected calcium (WNL), Albumin (L; 2.3)  CBG (last 3)  Recent Labs    10/15/17 0347 10/15/17 0838 10/15/17 1145  GLUCAP 148* 160* 196*   Medications: Colace, famotidine, insulin, miralax, prednisone, senokot, zosyn,   Diet Order:  No diet orders on file  EDUCATION NEEDS:   No education needs have been identified at this time  Skin:  Skin Assessment: Reviewed RN Assessment  Last BM:  2/28 rectal tube  Height:   Ht Readings from Last 1 Encounters:  10/01/17 5\' 9"  (1.753 m)    Weight:   Filed Weights   10/13/17 0337 10/14/17 0430  10/15/17 0400  Weight: 270 lb 1 oz (122.5 kg) 264 lb 1.8 oz (119.8 kg) 251 lb 5.2 oz (114 kg)    Ideal Body Weight:  72.7 kg  BMI:  Body mass index is 37.11 kg/m.  Estimated Nutritional Needs:   Kcal:  [id number]  Protein:  145 gm  Fluid:  2 L   Malayja Freund, MS, Dietetic Intern Pager # [phone]

## 2017-10-15 NOTE — Progress Notes (Signed)
PULMONARY / CRITICAL CARE MEDICINE   Name: Chris RiversJerome A Smalling Jr. MRN: [id number] DOB: 01/08/1966    ADMISSION DATE:  10/01/2017 CONSULTATION DATE:  2/21  REFERRING MD:  Juleen ChinaKohut  CHIEF COMPLAINT:  SOB  HISTORY OF PRESENT ILLNESS:   52 yo male presented with dyspnea, cough, fever, hypoxia.  Found to have Influenza PNA causing ARDS.  Course complicated by hypervolemia and renal failure.  PMHx of HIV, A fib, COPD, DM.   SUBJECTIVE:  Hyperkalemic overnight despite CVVHD, slightly improved this am.  Still low grade fever.  Started on vanc/zosyn yesterday.   VITAL SIGNS: BP (!) 145/69   Pulse 83   Temp 99 F (37.2 C) (Oral)   Resp (!) 34   Ht 5\' 9"  (1.753 m)   Wt 114 kg (251 lb 5.2 oz)   SpO2 95%   BMI 37.11 kg/m   HEMODYNAMICS: CVP:  [10 mmHg-14 mmHg] 13 mmHg  VENTILATOR SETTINGS: Vent Mode: PRVC FiO2 (%):  [60 %] 60 % Set Rate:  [30 bmp] 30 bmp Vt Set:  [600 mL] 600 mL PEEP:  [10 cmH20] 10 cmH20 Plateau Pressure:  [24 cmH20-28 cmH20] 28 cmH20  INTAKE / OUTPUT: I/O last 3 completed shifts: In: 5146.1 [I.V.:1566.1; NG/GT:2680; IV Piggyback:900] Out: [id number] [Urine:80; Other:10858]  PHYSICAL EXAMINATION:  General: 52 year old male no response on vent HEENT: Trach with purulent secretions, mm moist Neuro: Does not arouse to voice follow commands CV: s1s2 rrr  PULM: resps even/non-labored, intermittently dyssynchronous with vent, coarse, diminished bases  GI: distended, taught, hypoactive bs  Extremities: warm/dry, 2+ edema  Skin: no rashes or lesions   LABS:  BMET Recent Labs  Lab 10/14/17 1514 10/14/17 2352 10/15/17 0341  NA 133* 133* 134*  K 6.1* 5.6* 5.2*  CL 96* 97* 97*  CO2 24 25 24   BUN 71* 68* 66*  CREATININE 2.54* 2.46* 2.37*  GLUCOSE 190* 194* 177*   Electrolytes Recent Labs  Lab 10/12/17 0450  10/13/17 0254  10/14/17 0317 10/14/17 1514 10/14/17 2352 10/15/17 0341  CALCIUM 8.1*   < > 8.5*   < >  --  8.8* 8.5* 8.4*  MG 3.1*  --  3.0*  --   3.0*  --   --   --   PHOS 9.7*   < > 7.2*   < > 6.5* 7.4* 7.5* 7.2*   < > = values in this interval not displayed.   CBC Recent Labs  Lab 10/13/17 0254 10/14/17 0317 10/15/17 0353  WBC 16.2* 25.1* 24.4*  HGB 8.5* 9.0* 8.8*  HCT 27.0* 29.1* 28.7*  PLT 213 245 219   Coag's Recent Labs  Lab 10/09/17 0355 10/09/17 1247 10/09/17 2000 10/14/17 0317 10/15/17 0353  APTT 60* 66* 68*  --   --   INR 1.20  --   --  1.10 1.17    Sepsis Markers Recent Labs  Lab 10/14/17 0918 10/15/17 0353  PROCALCITON 11.32 6.91    ABG Recent Labs  Lab 10/11/17 2331 10/13/17 0550 10/13/17 0950  PHART 7.309* 7.261* 7.315*  PCO2ART 48.6* 57.9* 51.7*  PO2ART 68.0* 194* 78.3*    Liver Enzymes Recent Labs  Lab 10/14/17 1514 10/14/17 2352 10/15/17 0341  ALBUMIN 2.3* 2.3* 2.3*    Cardiac Enzymes Recent Labs  Lab 10/08/17 1622 10/08/17 2140 10/09/17 0355  TROPONINI 0.03* 0.04* 0.05*    Glucose Recent Labs  Lab 10/14/17 0741 10/14/17 1141 10/14/17 1536 10/14/17 2005 10/14/17 2335 10/15/17 0347  GLUCAP 124* 123* 182* 178* 177* 148*  Imaging Dg Abd Portable 1v  Result Date: 10/14/2017 CLINICAL DATA:  NG tube placement EXAM: PORTABLE ABDOMEN - 1 VIEW COMPARISON:  10/06/2017 FINDINGS: Feeding tube with tip in the pyloric region of the stomach/first portion duodenum. No dilated large or small bowel. IMPRESSION: Feeding tube with tip at the first portion duodenum. Electronically Signed   By: Genevive Bi M.D.   On: 10/14/2017 09:24    STUDIES:  Echo 2/25 >> EF 60 to 65%, mild MR Renal u/s 2/28 >> slight fullness of renal collecting system  CULTURES: Respiratory viral panel 2/21 >> Influenza A H3 Blood 2/21 >> negative Sputum 2/22 >> oral flora Urine 2/26 >> negative Sputum 2/26 >> oral flora Blood 2/27 >> negative Sputum 3/5>>>normal flora  BC x 2 3/5>>>  ANTIBIOTICS: Vancomycin 2/21 >>2/22 Cefepime 2/21 >> 2/28 Tamiflu 2/21 >> 2/26 Fluconazole 2/21 >>  2/27 Anidulafungin 2/27 >>3/6 Zithromax 2/27 >> 3/1  SIGNIFICANT EVENTS: 2/21 Admit 2/22 VDRF 2/27 ID consulted for persistent fever 2/28 Renal consult for AKI, Cardiology consult for A fib with RVR 3/03 Attempted HD  3/04 Changed to CRRT 3/05 Worsening hypoxia >> vent settings adjusted  LINES/TUBES: ETT 2/22 >> 3/1 Left IJ 2/27 >>> Trach 3/01 >>> R IJ HD cath 3/03 >>>  DISCUSSION: 51yom with acute respiratory failure r/t flu/ARDS/PNA with superimposed AECOPD c/b AFib with RVR, AKI and marked volume overload.   ASSESSMENT / PLAN:  Acute hypoxic/hypercapnic respiratory failure from Influenza PNA. Hx of COPD with emphysema. ?HCAP v purulent tracheobronchitis  P:  Vent support - 8cc/kg  F/u CXR  F/u ABG Pulmonary hygiene - using bed for percussion  abx as above  Continue prednisone and wean to off as able - decrease to 20mg /day 3/7    A fib with RVR. Acute on chronic diastolic CHF with hx of HTN. P:  continue amiodarone Increase diltiazem to 60 q 6 -- takes 360mg XR daily  transition from heparin to coumadin per pharmacy Monitor electrolytes   Acute renal failure 2nd to ATN. Hyperkalemia. P:  CRRT per renal >> goal negative fluid balance F/u chem   Anemia of critical illness and chronic disease. P:  f/u CBC Heparin/coumadin per pharmacy    Persistent Fever, WBC trending up  Esophageal candidiasis. Hx of HIV. P:  eraxis complete 3/06 per ID Vanc/zosyn added 3/6 with ongoing fever, WBC jump, purulent secretions  continue emtriva, tivicay, tenofivir Follow culture data  Trend pct    abd distension - KUB neg acute issues 3/6 P:  D/c flexiseal  Continue bowel regimen  KUB   DM type II. P  SSI with lantus and TF coverage    Acute metabolic encephalopathy. P:  klonopin 0.5 mg bid - may be able to wean further but still needing IV sedation for vent synchrony   RASS goal 0 to -1 Daily WUA    DVT prophylaxis - heparin gtt with transition to  coumadin SUP - pepcid Nutrition - tube feeds Goals of care - full code -- need ongoing goals of care discussion with family.    Wife updated at length at bedside 3/7.  Discussed concerns that he is not making much progress, suspect prognosis is poor overall.  Have asked palliative care to assist with goals of care.  Not sure wife understands full extent/severity of his illness.  She seems to think he will get better soon because he looked at her and kissed her back yesterday.  Will continue to update family.    Dirk Dress, NP 10/15/2017  9:06 AM Pager: [phone] or [phone]

## 2017-10-15 NOTE — Progress Notes (Signed)
Per Dr. Eliott Nineunham, When patient's Potassium drops below 5.0, change the Pre and Post fluids to 4K/2.5Ca

## 2017-10-15 NOTE — Progress Notes (Signed)
Pharmacy Consult Note - Anticoagulation  Pharmacy Consult for heparin/warfarin bridge Indication: pulmonary embolus/Afib  Allergies  Allergen Reactions  . Bactrim [Sulfamethoxazole-Trimethoprim] Hives    Patient Measurements: Height: _0  (175.3 cm) Weight: 251 lb 5.2 oz (114 kg) IBW/kg (Calculated) : 70.7 Heparin Dosing Weight: 98 kg  Vital Signs: Temp: 99 F (37.2 C) (03/07 0800) Temp Source: Oral (03/07 0800) BP: 179/71 (03/07 0939) Pulse Rate: 88 (03/07 0900)  Height: _1  (175.3 cm) Weight: 264 lb 1.8 oz (119.8 kg) IBW/kg (Calculated) : 70.7  Temp (24hrs), Avg:98.9 F (37.2 C), Min:98.3 F (36.8 C), Max:100.6 F (38.1 C)  Recent Labs  Lab 10/10/17 0211  10/11/17 0434 10/12/17 0450 10/12/17 1543 10/13/17 0254 10/13/17 1504 10/13/17 2138 10/14/17 0316 10/14/17 0317  WBC 10.1  --  10.8* 13.4*  --  16.2*  --   --   --  25.1*  CREATININE 4.18*   < > 5.77* 6.02* 5.09* 4.13* 3.31* 3.11* 2.89*  --    < > = values in this interval not displayed.    Estimated Creatinine Clearance: 38.6 mL/min (A) (by C-G formula based on SCr of 2.89 mg/dL (H)).    Recent Labs    10/13/17 0252  10/13/17 0254  10/14/17 0316 10/14/17 0317 10/14/17 1514 10/14/17 2352 10/15/17 0341 10/15/17 0353  HGB  --    < > 8.5*  --   --  9.0*  --   --   --  8.8*  HCT  --   --  27.0*  --   --  29.1*  --   --   --  28.7*  PLT  --   --  213  --   --  245  --   --   --  219  LABPROT  --   --   --   --   --  14.1  --   --   --  14.8  INR  --   --   --   --   --  1.10  --   --   --  1.17  HEPARINUNFRC 0.45  --   --   --  0.70  --   --   --   --  0.35  CREATININE  --   --  4.13*   < > 2.89*  --  2.54* 2.46* 2.37*  --    < > = values in this interval not displayed.    Estimated Creatinine Clearance: 45.9 mL/min (A) (by C-G formula based on SCr of 2.37 mg/dL (H)).   Medications:  Scheduled:  . amiodarone  200 mg Per Tube BID   Followed by  . [START ON 10/18/2017] amiodarone  200 mg Per  Tube Daily  . chlorhexidine gluconate (MEDLINE KIT)  15 mL Mouth Rinse BID  . Chlorhexidine Gluconate Cloth  6 each Topical Daily  . clonazePAM  0.5 mg Per Tube BID  . diltiazem  60 mg Per Tube Q6H  . docusate  100 mg Per Tube BID  . dolutegravir  50 mg Oral Daily  . [START ON 10/16/2017] emtricitabine  200 mg Oral Once every 4 days  . famotidine  20 mg Per Tube QHS  . feeding supplement (VITAL HIGH PROTEIN)  1,000 mL Per Tube Q24H  . insulin aspart  0-20 Units Subcutaneous Q4H  . insulin aspart  5 Units Subcutaneous Q4H  . insulin glargine  35 Units Subcutaneous BID  . ipratropium-albuterol  3 mL Nebulization Q6H  .  mouth rinse  15 mL Mouth Rinse 10 times per day  . polyethylene glycol  17 g Oral Daily  . predniSONE  20 mg Per Tube Q breakfast  . sennosides  5 mL Per Tube QHS  . sodium chloride flush  10-40 mL Intracatheter Q12H  . sodium chloride flush  3 mL Intravenous Q12H  . tenofovir  300 mg Oral Q Wed  . warfarin  15 mg Oral ONCE-1800  . Warfarin - Pharmacist Dosing Inpatient   Does not apply q1800   Infusions:  . sodium chloride 500 mL (10/14/17 1442)  . sodium chloride    . sodium chloride    . fentaNYL infusion INTRAVENOUS 125 mcg/hr (10/15/17 0922)  . heparin 2,850 Units/hr (10/15/17 0926)  . heparin    . piperacillin-tazobactam (ZOSYN)  IV Stopped (10/15/17 0900)  . dialysis replacement fluid (prismasate) 500 mL/hr at 10/14/17 1111  . dialysis replacement fluid (prismasate) 500 mL/hr at 10/15/17 0826  . dialysate (PRISMASATE) 2,000 mL/hr at 10/15/17 0808  . vancomycin 1,000 mg (10/15/17 0919)    Goal of Therapy:  Heparin level 0.3-0.7 units/ml INR 2 - 3  Monitor platelets by anticoagulation protocol: Yes   Assessment 40 YOM with Afib on Eliquis PTA with new PE this admission while on Eliquis, now on day 3 of warfarin/heparin bridge. Of note patient is currently on CRRT and receiving some heparin through that modality. Today heparin level is 0.35 after rate  decrease yesterday, at low end of goal range. INR is subtherapeutic 1.17, has not increased significantly since starting warfarin. CBC stable, no bleeding noted.  Plan -Increase heparin to 2850 units/hr -Warfarin 15 mg PO x 1 tonight. -Daily heparin level, INR and CBC -Monitor s/sx of bleeding   Thank you for allowing pharmacy to be a part of this patient's care.  Charlene Brooke, PharmD PGY1 Pharmacy Resident Pager: [phone] After 4:00PM please call Main Pharmacy [phone] 10/15/2017,9:56 AM

## 2017-10-15 NOTE — Progress Notes (Signed)
Pt had random rapid increase in heart rate to 140's on bedside monitor. EKG obtained to try to capture change. Dr Craige CottaSood notified. EKG in chart. Nursing will monitor. Other vitals stable at this time

## 2017-10-15 NOTE — Progress Notes (Signed)
[facility]   Reason for visit: Follow up on HIV, fever  Interval History:  Tmax 99; WBC down to 24.4.  Wife at bedside Day 2 vancomycin Day 2 pip-tazo  Physical Exam: Constitutional:  Vitals:   10/15/17 0900 10/15/17 0939  BP: (!) 157/61 (!) 179/71  Pulse: 88   Resp: 15   Temp:    SpO2: 95%    patient is not responsive Eyes: anicteric, open HENT: + trach Respiratory: respiratory effort on vent; CTA B Cardiovascular: RRR, + SEM GI: soft, + distended,   Review of Systems: Unable to be assessed due to mental status  Lab Results  Component Value Date   WBC 24.4 (H) 10/15/2017   HGB 8.8 (L) 10/15/2017   HCT 28.7 (L) 10/15/2017   MCV 94.4 10/15/2017   PLT 219 10/15/2017    Lab Results  Component Value Date   CREATININE 2.37 (H) 10/15/2017   BUN 66 (H) 10/15/2017   NA 134 (L) 10/15/2017   K 5.2 (H) 10/15/2017   CL 97 (L) 10/15/2017   CO2 24 10/15/2017    Lab Results  Component Value Date   ALT 18 10/02/2017   AST 23 10/02/2017   ALKPHOS 44 10/02/2017     Microbiology: Recent Results (from the past 240 hour(s))  Urine Culture     Status: None   Collection Time: 10/06/17 10:38 AM  Result Value Ref Range Status   Specimen Description URINE, CATHETERIZED  Final   Special Requests NONE  Final   Culture   Final    NO GROWTH Performed at [facility], [address], [place], [place] [address]    Report Status 10/07/2017 FINAL  Final  Culture, respiratory (NON-Expectorated)     Status: None   Collection Time: 10/06/17 12:27 PM  Result Value Ref Range Status   Specimen Description TRACHEAL ASPIRATE  Final   Special Requests NONE  Final   Gram Stain   Final    MODERATE WBC PRESENT,BOTH PMN AND MONONUCLEAR NO ORGANISMS SEEN    Culture   Final    MODERATE Consistent with normal respiratory flora. Performed at [facility], [address], [place], [place] [address]    Report Status 10/08/2017 FINAL  Final    Culture, blood (routine x 2)     Status: None   Collection Time: 10/07/17 11:00 AM  Result Value Ref Range Status   Specimen Description BLOOD RIGHT ANTECUBITAL  Final   Special Requests   Final    BOTTLES DRAWN AEROBIC AND ANAEROBIC Blood Culture adequate volume   Culture   Final    NO GROWTH 5 DAYS Performed at [facility], [address] [place], [place] [address]    Report Status 10/12/2017 FINAL  Final  Culture, blood (routine x 2)     Status: None   Collection Time: 10/07/17 11:15 AM  Result Value Ref Range Status   Specimen Description BLOOD RIGHT HAND  Final   Special Requests   Final    BOTTLES DRAWN AEROBIC AND ANAEROBIC Blood Culture adequate volume   Culture   Final    NO GROWTH 5 DAYS Performed at [facility], [address], [place], [place] [address]    Report Status 10/12/2017 FINAL  Final  Culture, respiratory (NON-Expectorated)     Status: None (Preliminary result)   Collection Time: 10/13/17 10:06 AM  Result Value Ref Range Status   Specimen Description TRACHEAL ASPIRATE  Final   Special Requests NONE  Final   Gram Stain   Final    RARE WBC PRESENT, PREDOMINANTLY PMN RARE GRAM POSITIVE COCCI RARE GRAM VARIABLE ROD    Culture   Final    Consistent with normal respiratory flora. Performed at [facility], [address] [place], [place] [id number]    Report Status PENDING  Incomplete    Impression/Plan:  1. HIV -continues on renally dosed regimen and will continue.   2.  Fever - some improvement in WBC, fever.  On pip-tazo for possible pneumonia.  Not MRSAA colonized.  I will stop vancomycin.    3.  abdominal distension - KUB without significant findings.  4.  Renal insufficiency - on CRRT.  I will stop vancomycin as above to limit nephrotoxic medications.

## 2017-10-15 NOTE — Progress Notes (Signed)
[facility] Rounding Note  Subjective/Interval History:   HD 3/3; CRRT started 3/4  2/2 hyperkalemia, marked azotemia, high O2 requirement Pre 500 Post 500  DFR 2000 All 4 K fluids as of PM of 3/6 2/2 hyperkalemia Effluent dose 20 ml/kg/hour Temp cath 3/3 R IJ Fluid removal decr 150 last 24 hours Continues with fevers  Objective Vital signs in last 24 hours: Vitals:   10/15/17 0438 10/15/17 0500 10/15/17 0530 10/15/17 0600  BP: 130/64 (!) 144/61 (!) 131/59 (!) 131/58  Pulse: 80 80 80 81  Resp: (!) 30 (!) 30 (!) 27 (!) 30  Temp:      TempSrc:      SpO2: 95% 94% 94% 95%  Weight:      Height:       Weight change: -5.8 kg (-12.6 oz)  Intake/Output Summary (Last 24 hours) at 10/15/2017 0629 Last data filed at 10/15/2017 0600 Gross per 24 hour  Intake 3480.53 ml  Output 7275 ml  Net -3794.47 ml   Physical Exam:  Blood pressure (!) 131/58, pulse 81, temperature 98.6 F (37 C), temperature source Oral, resp. rate (!) 30, height '5\' 9"'  (1.753 m), weight 114 kg (251 lb 5.2 oz), SpO2 95 %.  Trached Eyes open, maybe some response now (more to family) Anasarca improving R IJ temp HD cath (3/3) Smelly trach site Coarse rhonchous breath sounds Tachy S1S2 NoS3 Obese abdomen, + BS still tight SCD's in place  Weight trending                      10/15/17  114 kg   10/15/15 119.8 kg-   10/13/17 122.5 kg   10/12/17  130.8 kg    10/11/17  127.6 kg    10/10/17  126.1      Recent Labs  Lab 10/13/17 0254 10/13/17 1504 10/13/17 2138 10/14/17 0316 10/14/17 0317 10/14/17 1514 10/14/17 2352 10/15/17 0341  NA 135 135 134* 133*  --  133* 133* 134*  K 5.5* 5.7* 5.9* 5.7*  --  6.1* 5.6* 5.2*  CL 99* 97* 98* 98*  --  96* 97* 97*  CO2 '23 23 24 25  ' --  '24 25 24  ' GLUCOSE 231* 181* 217* 182*  --  190* 194* 177*  BUN 113* 92* 84* 79*  --  71* 68* 66*  CREATININE 4.13* 3.31* 3.11* 2.89*  --  2.54* 2.46* 2.37*  CALCIUM 8.5* 8.6* 8.5* 8.7*  --  8.8* 8.5* 8.4*  PHOS 7.2* 6.5*  --  6.5* 6.5*  7.4* 7.5* 7.2*    Recent Labs  Lab 10/14/17 1514 10/14/17 2352 10/15/17 0341  ALBUMIN 2.3* 2.3* 2.3*    Recent Labs  Lab 10/10/17 0211 10/11/17 0434 10/12/17 0450 10/13/17 0254 10/14/17 0317 10/15/17 0353  WBC 10.1 10.8* 13.4* 16.2* 25.1* 24.4*  NEUTROABS 8.2* 8.5* 12.4* 15.6*  --   --   HGB 9.8* 9.4* 9.2* 8.5* 9.0* 8.8*  HCT 33.1* 30.6* 29.6* 27.0* 29.1* 28.7*  MCV 93.2 92.7 91.4 90.6 92.7 94.4  PLT 111* 150 207 213 245 219    Recent Labs  Lab 10/08/17 1000 10/08/17 1622 10/08/17 2140 10/09/17 0355  TROPONINI 0.03* 0.03* 0.04* 0.05*    Recent Labs  Lab 10/14/17 1141 10/14/17 1536 10/14/17 2005 10/14/17 2335 10/15/17 0347  GLUCAP 123* 182* 178* 177* 148*    Studies/Results: Dg Chest Port 1 View  Result Date: 10/14/2017 CLINICAL DATA:  Respiratory failure, endotracheal tube. EXAM: PORTABLE CHEST 1 VIEW COMPARISON:  10/13/2017  and CT chest 07/23/2017. FINDINGS: Tracheostomy is midline. Right IJ central line tip projects at the junction of the brachiocephalic veins. Left IJ central line tip is likely in the upper SVC. Feeding tube is followed into the stomach with the tip projecting beyond the inferior boundary of the image. Heart size stable. There is a fine pattern of interstitial prominence bilaterally, likely due to emphysema when compared with 07/23/2017. Bibasilar aeration has improved from yesterday, however. There may be trace right pleural fluid. IMPRESSION: 1. Improving bibasilar aeration with residual airspace opacification, possibly due to atelectasis. Pneumonia is not excluded. 2. Probable trace right pleural effusion. Electronically Signed   By: Lorin Picket M.D.   On: 10/14/2017 05:49   Dg Abd Portable 1v  Result Date: 10/14/2017 CLINICAL DATA:  NG tube placement EXAM: PORTABLE ABDOMEN - 1 VIEW COMPARISON:  10/06/2017 FINDINGS: Feeding tube with tip in the pyloric region of the stomach/first portion duodenum. No dilated large or small bowel.  IMPRESSION: Feeding tube with tip at the first portion duodenum. Electronically Signed   By: Suzy Bouchard M.D.   On: 10/14/2017 09:24   Medications: . sodium chloride 500 mL (10/14/17 1442)  . sodium chloride    . sodium chloride    . fentaNYL infusion INTRAVENOUS 250 mcg/hr (10/14/17 2000)  . heparin 2,800 Units/hr (10/14/17 2350)  . heparin    . piperacillin-tazobactam (ZOSYN)  IV 3.375 g (10/15/17 0500)  . dialysis replacement fluid (prismasate) 500 mL/hr at 10/14/17 1111  . dialysis replacement fluid (prismasate) 500 mL/hr at 10/14/17 2138  . dialysate (PRISMASATE) 2,000 mL/hr at 10/15/17 0539  . vancomycin     Medications . amiodarone  200 mg Per Tube BID   Followed by  . [START ON 10/18/2017] amiodarone  200 mg Per Tube Daily  . chlorhexidine gluconate (MEDLINE KIT)  15 mL Mouth Rinse BID  . Chlorhexidine Gluconate Cloth  6 each Topical Daily  . clonazePAM  0.5 mg Per Tube BID  . diltiazem  60 mg Per Tube Q6H  . docusate  100 mg Per Tube BID  . dolutegravir  50 mg Oral Daily  . [START ON 10/16/2017] emtricitabine  200 mg Oral Once every 4 days  . famotidine  20 mg Per Tube QHS  . feeding supplement (VITAL HIGH PROTEIN)  1,000 mL Per Tube Q24H  . insulin aspart  0-20 Units Subcutaneous Q4H  . insulin aspart  5 Units Subcutaneous Q4H  . insulin glargine  35 Units Subcutaneous BID  . ipratropium-albuterol  3 mL Nebulization Q6H  . mouth rinse  15 mL Mouth Rinse 10 times per day  . polyethylene glycol  17 g Oral Daily  . predniSONE  20 mg Per Tube Q breakfast  . sennosides  5 mL Per Tube QHS  . sodium chloride flush  10-40 mL Intracatheter Q12H  . sodium chloride flush  3 mL Intravenous Q12H  . tenofovir  300 mg Oral Q Wed  . Warfarin - Pharmacist Dosing Inpatient   Does not apply q1800    Background: 52 yo AAM HIV AF COPD D< MO Admitted w/resp failure, VDRF. Initially + Flu A, PNA; possible PE;  Baseline creatinine 0.79 10/01/17. AKI in setting of above issues. HDX1 3/4  for volume, but markedly catabolic/recurrent [name], massively overloaded. CRRT initiated 10/12/17 for massive anasarca hyperkalemia. R IJ temp cath 3/3 by CCM.   Assessment/Recommendations  1. AKI - normal creatinine at baseline. Markedly catabolic. Anasarca. CRRT inititated 3/4. Weight down 12 kg. Decr fluid  removal to 100-150. Remains very catabolic. Foley out. Daily bladder scan. No urine.  2. Hyperkalemia has finally necessitated use of all 0 K fluids. K finally trending down. When <5, change replacement fluids back to 4K, keep dialysate at 0. 3. VDRF/COPD/ARDS/FluA+ - finished Tamiflu/ATB's; on steroids. Per CCM 4. HIV -meds; ID following 5. Esophageal candidiasis - noted during intubation. IV antifungals done today 6. Fevers/progressiove leukocytosis. CCM addressing. Now on vanco and zosyn for possible PNA 7. AF - amio/heparin/low dose dilt 8. DM - per CCM 9. Encephalopathy - more responsive with family.   Jamal Maes, MD [facility] [phone] pager 10/15/2017, 6:29 AM

## 2017-10-16 ENCOUNTER — Inpatient Hospital Stay (HOSPITAL_COMMUNITY): Payer: Medicare HMO

## 2017-10-16 LAB — GLUCOSE, CAPILLARY
GLUCOSE-CAPILLARY: 134 mg/dL — AB (ref 65–99)
GLUCOSE-CAPILLARY: 160 mg/dL — AB (ref 65–99)
GLUCOSE-CAPILLARY: 210 mg/dL — AB (ref 65–99)
Glucose-Capillary: 154 mg/dL — ABNORMAL HIGH (ref 65–99)
Glucose-Capillary: 139 mg/dL — ABNORMAL HIGH (ref 65–99)
Glucose-Capillary: 204 mg/dL — ABNORMAL HIGH (ref 65–99)

## 2017-10-16 LAB — HEPARIN LEVEL (UNFRACTIONATED): Heparin Unfractionated: 0.45 IU/mL (ref 0.30–0.70)

## 2017-10-16 LAB — CBC
HEMATOCRIT: 29.9 % — AB (ref 39.0–52.0)
HEMOGLOBIN: 9.1 g/dL — AB (ref 13.0–17.0)
MCH: 28.8 pg (ref 26.0–34.0)
MCHC: 30.4 g/dL (ref 30.0–36.0)
MCV: 94.6 fL (ref 78.0–100.0)
Platelets: 216 10*3/uL (ref 150–400)
RBC: 3.16 MIL/uL — ABNORMAL LOW (ref 4.22–5.81)
RDW: 14.3 % (ref 11.5–15.5)
WBC: 29.8 10*3/uL — AB (ref 4.0–10.5)

## 2017-10-16 LAB — RENAL FUNCTION PANEL
ALBUMIN: 2.3 g/dL — AB (ref 3.5–5.0)
ANION GAP: 12 (ref 5–15)
Albumin: 2.1 g/dL — ABNORMAL LOW (ref 3.5–5.0)
Anion gap: 15 (ref 5–15)
BUN: 57 mg/dL — AB (ref 6–20)
BUN: 56 mg/dL — AB (ref 6–20)
CALCIUM: 8.7 mg/dL — AB (ref 8.9–10.3)
CO2: 23 mmol/L (ref 22–32)
CO2: 26 mmol/L (ref 22–32)
Calcium: 8.9 mg/dL (ref 8.9–10.3)
Chloride: 95 mmol/L — ABNORMAL LOW (ref 101–111)
Chloride: 97 mmol/L — ABNORMAL LOW (ref 101–111)
Creatinine, Ser: 2.09 mg/dL — ABNORMAL HIGH (ref 0.61–1.24)
Creatinine, Ser: 2.03 mg/dL — ABNORMAL HIGH (ref 0.61–1.24)
GFR calc Af Amer: 41 mL/min — ABNORMAL LOW (ref >60)
GFR calc Af Amer: 42 mL/min — ABNORMAL LOW (ref >60)
GFR calc non Af Amer: 35 mL/min — ABNORMAL LOW (ref >60)
GFR, EST NON AFRICAN AMERICAN: 36 mL/min — AB (ref >60)
GLUCOSE: 186 mg/dL — AB (ref 65–99)
Glucose, Bld: 164 mg/dL — ABNORMAL HIGH (ref 65–99)
PHOSPHORUS: 6.2 mg/dL — AB (ref 2.5–4.6)
PHOSPHORUS: 7.2 mg/dL — AB (ref 2.5–4.6)
POTASSIUM: 3.8 mmol/L (ref 3.5–5.1)
POTASSIUM: 5.1 mmol/L (ref 3.5–5.1)
SODIUM: 133 mmol/L — AB (ref 135–145)
Sodium: 135 mmol/L (ref 135–145)

## 2017-10-16 LAB — CULTURE, RESPIRATORY W GRAM STAIN: Culture: NORMAL

## 2017-10-16 LAB — PROTIME-INR
INR: 1.43
Prothrombin Time: 17.3 seconds — ABNORMAL HIGH (ref 11.4–15.2)

## 2017-10-16 LAB — CULTURE, RESPIRATORY

## 2017-10-16 LAB — PROCALCITONIN: Procalcitonin: 8.65 ng/mL

## 2017-10-16 MED ORDER — CLONAZEPAM 0.5 MG PO TABS
0.5000 mg | ORAL_TABLET | Freq: Every day | ORAL | Status: DC
  Administered 2017-10-17 – 2017-10-29 (×14): 0.5 mg
  Filled 2017-10-16 (×15): qty 1

## 2017-10-16 MED ORDER — PIPERACILLIN-TAZOBACTAM 3.375 G IVPB 30 MIN
3.3750 g | Freq: Four times a day (QID) | INTRAVENOUS | Status: AC
  Administered 2017-10-16 – 2017-10-20 (×18): 3.375 g via INTRAVENOUS
  Filled 2017-10-16 (×18): qty 50

## 2017-10-16 MED ORDER — PRISMASOL BGK 0/2.5 32-2.5 MEQ/L IV SOLN
INTRAVENOUS | Status: DC
Start: 2017-10-16 — End: 2017-10-18
  Administered 2017-10-16 – 2017-10-18 (×14): via INTRAVENOUS_CENTRAL
  Filled 2017-10-16 (×19): qty 5000

## 2017-10-16 MED ORDER — PRISMASOL BGK 4/2.5 32-4-2.5 MEQ/L IV SOLN
INTRAVENOUS | Status: DC
Start: 2017-10-16 — End: 2017-10-16
  Administered 2017-10-16 (×2): via INTRAVENOUS_CENTRAL
  Filled 2017-10-16 (×6): qty 5000

## 2017-10-16 MED ORDER — WARFARIN SODIUM 7.5 MG PO TABS
15.0000 mg | ORAL_TABLET | Freq: Once | ORAL | Status: AC
  Administered 2017-10-16: 15 mg via ORAL
  Filled 2017-10-16 (×2): qty 2

## 2017-10-16 NOTE — Progress Notes (Signed)
[facility] Rounding Note  Subjective/Interval History:   HD 3/3; CRRT 3/4  2/2 hyperkalemia, marked azotemia, high O2 requirement 500/500/2000 0/0/4K (3/7) Effluent dose 20 ml/kg/hour Temp cath 3/3 R IJ Fluid removal decr 150/hour last 24 hours   Objective Vital signs in last 24 hours: Vitals:   10/16/17 1000 10/16/17 1030 10/16/17 1100 10/16/17 1115  BP: (!) 103/55 (!) 78/45  (!) 91/50  Pulse: 64 (!) 57 (!) 58 62  Resp: _0 Temp:      TempSrc:      SpO2: 92% 94% 95% 93%  Weight:      Height:       Weight change: -3.6 kg (-15 oz)  Intake/Output Summary (Last 24 hours) at 10/16/2017 1129 Last data filed at 10/16/2017 1116 Gross per 24 hour  Intake 3116.42 ml  Output 6247 ml  Net -3130.58 ml   Physical Exam:  Blood pressure (!) 91/50, pulse 62, temperature 98 F (36.7 C), temperature source Oral, resp. rate 15, height _1  (1.753 m), weight 110.4 kg (243 lb 6.2 oz), SpO2 93 %.  Trached Intubated and sedated Anasarca MUCH improved R IJ temp HD cath (3/3) Smelly trach site Coarse rhonchous breath sounds Tachy S1S2 NoS3 Obese abdomen, + BS still tight SCD's in place Wrinkling skin of feet MUCH less pittine edema ext's  Weight trending                  10/16/17 110.4 kg   10/15/17  114 kg   10/15/15 119.8 k   10/13/17 122.5 kg   10/12/17  130.8 kg  CRRT started  10/11/17  127.6 kg    10/10/17  126.1      Recent Labs  Lab 10/13/17 2138 10/14/17 0316 10/14/17 0317 10/14/17 1514 10/14/17 2352 10/15/17 0341 10/15/17 1525 10/16/17 0412  NA 134* 133*  --  133* 133* 134* 132* 133*  K 5.9* 5.7*  --  6.1* 5.6* 5.2* 4.6 3.8  CL 98* 98*  --  96* 97* 97* 95* 95*  CO2 24 25  --  _2 GLUCOSE 217* 182*  --  190* 194* 177* 201* 186*  BUN 84* 79*  --  71* 68* 66* 60* 57*  CREATININE 3.11* 2.89*  --  2.54* 2.46* 2.37* 2.24* 2.09*  CALCIUM 8.5* 8.7*  --  8.8* 8.5* 8.4* 8.4* 8.7*  PHOS  --  6.5* 6.5* 7.4* 7.5* 7.2* 6.8* 6.2*    Recent Labs  Lab  10/15/17 0341 10/15/17 1525 10/16/17 0412  ALBUMIN 2.3* 2.2* 2.3*    Recent Labs  Lab 10/10/17 0211 10/11/17 0434 10/12/17 0450 10/13/17 0254 10/14/17 0317 10/15/17 0353 10/16/17 0412  WBC 10.1 10.8* 13.4* 16.2* 25.1* 24.4* 29.8*  NEUTROABS 8.2* 8.5* 12.4* 15.6*  --   --   --   HGB 9.8* 9.4* 9.2* 8.5* 9.0* 8.8* 9.1*  HCT 33.1* 30.6* 29.6* 27.0* 29.1* 28.7* 29.9*  MCV 93.2 92.7 91.4 90.6 92.7 94.4 94.6  PLT 111* 150 207 213 245 219 216     Recent Labs  Lab 10/15/17 2012 10/15/17 2347 10/16/17 0348 10/16/17 0745 10/16/17 1119  GLUCAP 249* 221* 160* 154* 139*    Studies/Results: Dg Chest Portable 1 View  Result Date: 10/16/2017 CLINICAL DATA:  Respiratory failure EXAM: PORTABLE CHEST 1 VIEW COMPARISON:  10/14/2017 FINDINGS: Tracheostomy in good position. Bilateral central venous catheter tips in the SVC unchanged. Feeding tube in place with the tip not visualized. Cardiac enlargement with mild  vascular congestion which has improved in the interval. Improvement in bibasilar airspace disease. No pleural effusion IMPRESSION: Improvement in bilateral airspace disease. Support lines remain in good position. Electronically Signed   By: Franchot Gallo M.D.   On: 10/16/2017   Medications: . sodium chloride 500 mL (10/14/17 1442)  . sodium chloride    . sodium chloride    . dexmedetomidine (PRECEDEX) IV infusion 1 mcg/kg/hr (10/16/17 1121)  . fentaNYL infusion INTRAVENOUS 300 mcg/hr (10/16/17 1034)  . heparin 2,850 Units/hr (10/16/17 0800)  . heparin    . piperacillin-tazobactam 3.375 g (10/16/17 1116)  . dialysate (PRISMASATE) 2,000 mL/hr at 10/16/17 1028  . dialysis replacement fluid (prismasate) 500 mL/hr at 10/16/17 0402  . dialysis replacement fluid (prismasate) 500 mL/hr at 10/16/17 0357   Medications . amiodarone  200 mg Per Tube BID   Followed by  . [START ON 10/18/2017] amiodarone  200 mg Per Tube Daily  . chlorhexidine gluconate (MEDLINE KIT)  15 mL Mouth Rinse  BID  . Chlorhexidine Gluconate Cloth  6 each Topical Daily  . [START ON 10/17/2017] clonazePAM  0.5 mg Per Tube QHS  . coumadin book   Does not apply Once  . diltiazem  60 mg Per Tube Q6H  . docusate  100 mg Per Tube BID  . dolutegravir  50 mg Oral Daily  . emtricitabine  200 mg Oral Once every 4 days  . famotidine  20 mg Per Tube QHS  . feeding supplement (VITAL HIGH PROTEIN)  1,000 mL Per Tube Q24H  . insulin aspart  0-20 Units Subcutaneous Q4H  . insulin aspart  5 Units Subcutaneous Q4H  . insulin glargine  35 Units Subcutaneous BID  . ipratropium-albuterol  3 mL Nebulization Q6H  . mouth rinse  15 mL Mouth Rinse 10 times per day  . polyethylene glycol  17 g Oral Daily  . predniSONE  20 mg Per Tube Q breakfast  . sennosides  5 mL Per Tube QHS  . sodium chloride flush  10-40 mL Intracatheter Q12H  . sodium chloride flush  3 mL Intravenous Q12H  . tenofovir  300 mg Oral Q Wed  . warfarin  15 mg Oral ONCE-1800  . Warfarin - Pharmacist Dosing Inpatient   Does not apply q1800    Background: 52 yo AAM HIV AF COPD D< MO Admitted w/resp failure, VDRF. Initially + Flu A, PNA; possible PE;  Baseline creatinine 0.79 10/01/17. AKI in setting of above issues. HDX1 3/4 for volume, but markedly catabolic/recurrent [name], massively overloaded. CRRT initiated 10/12/17 for massive anasarca hyperkalemia. R IJ temp cath 3/3 by CCM.   Assessment/Recommendations  1. AKI - normal creatinine at baseline. Markedly catabolic. Anasarca. CRRT inititated 3/4. Weight down 16 kg. CVP 7-9. CXR better (though still 60% FIO2)  Decr fluid removal to 0-100.  2. Hyperkalemia - necessitated use of all 0 K fluids for a while, currently just dialysate. . K finally down 3's. Change to all 4K fluids. 3. VDRF/COPD/ARDS/FluA+ - finished Tamiflu/ATB's; on steroids. Per CCM 4. HIV -meds; ID following 5. Esophageal candidiasis - noted during intubation. IV antifungals done  6. Fevers/leukocytosis. CCM/ID addressing. Vanco and zosyn  for possible PNA 7. AF - amio/heparin/low dose dilt 8. DM - per CCM 9. Encephalopathy - more responsive, sedation ^ d/t agitation. Per nursing follows some commands.  Chris Maes, MD [facility] [phone] pager 10/16/2017, 11:29 AM

## 2017-10-16 NOTE — Clinical Social Work Note (Signed)
Clinical Social Work Assessment  Patient Details  Name: Chris [name]. MRN: [id number] Date of Birth: 30-May-1966  Date of referral:  10/16/17               Reason for consult:  Facility Placement(potential SNF placement. )                Permission sought to share information with:  Family Supports Permission granted to share information::  Yes, Release of Information Signed  Name::     Patent attorney  Agency::  family  Relationship::  wife   Contact Information:  Patent attorney [phone]  Housing/Transportation Living arrangements for the past 2 months:  Single Family Home(with wife. ) Source of Information:  Spouse Patient Interpreter Needed:  None Criminal Activity/Legal Involvement Pertinent to Current Situation/Hospitalization:  No - Comment as needed Significant Relationships:  Spouse Lives with:  Spouse Do you feel safe going back to the place where you live?  Yes Need for family participation in patient care:  Yes (Comment)  Care giving concerns:  CSW spoke with pt's wife Jocelyn Lamer) as pt is intubated and unable to speak with CSW at this time. In speaking with pt's wife pt's wife denies having any concerns at the moment. CSW attempted to identify any possible concerns that wife and family may have- and still none mentioned at this time.    Social Worker assessment / plan:  CSW spoke with pt's wife via phone. During this time CSW was informed that pt is from home with wife where pt and wife have lived together for over 20 years. Wife expressed that she is a primary support for pt and that her daughter is as well. Pt doe snot have any other supports that have been identified at this time. During this assessment wife was engaged over the phone with CSW, however wife sounded to be asleep and answer questions very simply with statements such as "yes he lives with me". CSW attempted to ask open ended questions throughout the assessment which allowed wife to open up and talk with CSW  about plan of care. CSW spoke with wife about potential need for vent/snf placement out of state and in state if pulls through and wife is agreeable to placement- but prefers instate. CSW explained that CSW would send information when appropriate to all facilities just to what options pt has. Wife expressed verbal understanding of this.    Employment status:  Other (Comment)(unknown at this time. ) Insurance information:  Print production planner.) PT Recommendations:  Not assessed at this time Information / Referral to community resources:  Other (Comment Required)(discussed placement options for pt if needed at the time of discharge. Placement options included vent/snf's in state as well as out of state. )  Patient/Family's Response to care:  Pt's wife sounded to be understanding of plan of care at this time. CSW sought feedback form pt's wife on thoughts and not much was told to CSW during this time.   Patient/Family's Understanding of and Emotional Response to Diagnosis, Current Treatment, and Prognosis:  CSW attempted to explaine to wife why CSW and other staff members start working towards discharge needs so soon. Wife voiced understanding despite sounding tired and in and out of sleep.   Emotional Assessment Appearance:  Appears stated age(pt appears as stated.) Attitude/Demeanor/Rapport:  (pt's wife was engaged and willing to answer questions as asked. ) Affect (typically observed):  Unable to Assess Orientation:  Fluctuating Orientation (Suspected and/or reported Sundowners)(pt is  intubated at this time. ) Alcohol / Substance use:  Not Applicable Psych involvement (Current and /or in the community):  No (Comment)  Discharge Needs  Concerns to be addressed:  Care Coordination Readmission within the last 30 days:  No Current discharge risk:  Dependent with Mobility, Chronically ill Barriers to Discharge:  Continued Medical Work up   Sempra EnergyKierra S Maurio Baize, LCSWA 10/16/2017, 7:26 AM

## 2017-10-16 NOTE — Progress Notes (Signed)
PULMONARY / CRITICAL CARE MEDICINE   Name: Chris [name]. MRN: [id number] DOB: [birth year]    ADMISSION DATE:  10/01/2017 CONSULTATION DATE:  2/21  REFERRING MD:  Juleen China  CHIEF COMPLAINT:  SOB  HISTORY OF PRESENT ILLNESS:   52 yo male presented with dyspnea, cough, fever, hypoxia.  Found to have Influenza PNA causing ARDS.  Course complicated by hypervolemia and renal failure.  PMHx of HIV, A fib, COPD, DM.   SUBJECTIVE:  Remains on CRRT.  Increased WOB with PRVC.  VITAL SIGNS: BP (!) 103/51   Pulse 72   Temp 98 F (36.7 C) (Oral)   Resp 15   Ht 5\' 9"  (1.753 m)   Wt 243 lb 6.2 oz (110.4 kg)   SpO2 (!) 89%   BMI 35.94 kg/m   HEMODYNAMICS: CVP:  [9 mmHg-14 mmHg] 9 mmHg  VENTILATOR SETTINGS: Vent Mode: PRVC FiO2 (%):  [50 %-60 %] 60 % Set Rate:  [30 bmp] 30 bmp Vt Set:  [600 mL] 600 mL PEEP:  [10 cmH20] 10 cmH20 Plateau Pressure:  [17 cmH20-24 cmH20] 17 cmH20  INTAKE / OUTPUT: I/O last 3 completed shifts: In: 4471.7 [I.V.:1684.2; NG/GT:2340; IV Piggyback:447.5] Out: 9153 [Other:9153]  PHYSICAL EXAMINATION:  General - unresponsive Eyes - pupils reactive ENT - trach site clean Cardiac - regular, no murmur Chest - no wheeze, rales Abd - soft, non tender Ext - 1+ edema Skin - no rashes Neuro - doesn't follow commands  LABS:  BMET Recent Labs  Lab 10/15/17 0341 10/15/17 1525 10/16/17 0412  NA 134* 132* 133*  K 5.2* 4.6 3.8  CL 97* 95* 95*  CO2 24 25 23   BUN 66* 60* 57*  CREATININE 2.37* 2.24* 2.09*  GLUCOSE 177* 201* 186*   Electrolytes Recent Labs  Lab 10/12/17 0450  10/13/17 0254  10/14/17 0317  10/15/17 0341 10/15/17 1525 10/16/17 0412  CALCIUM 8.1*   < > 8.5*   < >  --    < > 8.4* 8.4* 8.7*  MG 3.1*  --  3.0*  --  3.0*  --   --   --   --   PHOS 9.7*   < > 7.2*   < > 6.5*   < > 7.2* 6.8* 6.2*   < > = values in this interval not displayed.   CBC Recent Labs  Lab 10/14/17 0317 10/15/17 0353 10/16/17 0412  WBC 25.1* 24.4* 29.8*   HGB 9.0* 8.8* 9.1*  HCT 29.1* 28.7* 29.9*  PLT 245 219 216   Coag's Recent Labs  Lab 10/09/17 1247 10/09/17 2000 10/14/17 0317 10/15/17 0353 10/16/17 0412  APTT 66* 68*  --   --   --   INR  --   --  1.10 1.17 1.43    Sepsis Markers Recent Labs  Lab 10/14/17 0918 10/15/17 0353 10/16/17 0412  PROCALCITON 11.32 6.91 8.65    ABG Recent Labs  Lab 10/11/17 2331 10/13/17 0550 10/13/17 0950  PHART 7.309* 7.261* 7.315*  PCO2ART 48.6* 57.9* 51.7*  PO2ART 68.0* 194* 78.3*    Liver Enzymes Recent Labs  Lab 10/15/17 0341 10/15/17 1525 10/16/17 0412  ALBUMIN 2.3* 2.2* 2.3*    Cardiac Enzymes No results for input(s): TROPONINI, PROBNP in the last 168 hours.  Glucose Recent Labs  Lab 10/15/17 1145 10/15/17 1557 10/15/17 2012 10/15/17 2347 10/16/17 0348 10/16/17 0745  GLUCAP 196* 182* 249* 221* 160* 154*    Imaging Dg Chest Portable 1 View  Result Date: 10/16/2017 CLINICAL DATA:  Respiratory failure EXAM: PORTABLE CHEST 1 VIEW COMPARISON:  10/14/2017 FINDINGS: Tracheostomy in good position. Bilateral central venous catheter tips in the SVC unchanged. Feeding tube in place with the tip not visualized. Cardiac enlargement with mild vascular congestion which has improved in the interval. Improvement in bibasilar airspace disease. No pleural effusion IMPRESSION: Improvement in bilateral airspace disease. Support lines remain in good position. Electronically Signed   By: Marlan Palauharles  Clark M.D.   On: 10/16/2017 06:48    STUDIES:  Echo 2/25 >> EF 60 to 65%, mild MR Renal u/s 2/28 >> slight fullness of renal collecting system  CULTURES: Respiratory viral panel 2/21 >> Influenza A H3 Blood 2/21 >> negative Sputum 2/22 >> oral flora Urine 2/26 >> negative Sputum 2/26 >> oral flora Blood 2/27 >> negative Sputum 3/5>>> Blood 3/6>>>  ANTIBIOTICS: Vancomycin 2/21 >>2/22 Cefepime 2/21 >> 2/28 Tamiflu 2/21 >> 2/26 Fluconazole 2/21 >> 2/27 Anidulafungin 2/27  >>3/6 Zithromax 2/27 >> 3/1  SIGNIFICANT EVENTS: 2/21 Admit 2/22 VDRF 2/27 ID consulted for persistent fever 2/28 Renal consult for AKI, Cardiology consult for A fib with RVR 3/03 Attempted HD  3/04 Changed to CRRT 3/05 Worsening hypoxia >> vent settings adjusted  LINES/TUBES: ETT 2/22 >> 3/1 Left IJ 2/27 >>> Trach 3/01 >>> R IJ HD cath 3/03 >>>  DISCUSSION: 51yom with acute respiratory failure r/t flu/ARDS/PNA with superimposed AECOPD c/b AFib with RVR, AKI and marked volume overload.   ASSESSMENT / PLAN:  Acute hypoxic/hypercapnic respiratory failure from Influenza PNA. Hx of COPD with emphysema. Purulent tracheobronchitis  - change to pressure control 3/08 - f/u CXR - wean off steroids - scheduled BDs  A fib with RVR. Acute on chronic diastolic CHF with hx of HTN. - continue amiodarone, cardizem - transitioning to coumadin  Acute renal failure 2nd to ATN. Hyperkalemia. - CRRT per renal  Anemia of critical illness and chronic disease. - f/u CBC  Purulent tracheobronchitis. Esophageal candidiasis. Hx of HIV. - Abx per ID - continue emtriva, tivicay, tenofivir  DM type II. - SSI with lantus  Acute metabolic encephalopathy. - change klonopin to 0.5 mg qhs - RASS goal 0   DVT prophylaxis - heparin gtt with transition to coumadin SUP - pepcid Nutrition - tube feeds Goals of care - full code.  Palliative care consulted.  CC time 32 minutes.  Coralyn HellingVineet Ermine Stebbins, MD [facility] 10/16/2017, 10:31 AM Pager:  [phone] After 3pm call: [phone]

## 2017-10-16 NOTE — Progress Notes (Signed)
[facility]   Reason for visit: Follow up on HIV, fever  Interval History:  Afebrile but WBC up to 29; no acute events.   Day 3 pip-tazo  Physical Exam: Constitutional:  Vitals:   10/16/17 0850 10/16/17 0900  BP:    Pulse:  67  Resp:  19  Temp:    SpO2: 92% 93%   patient is not responsive Eyes: anicteric, open HENT: + trach Respiratory: respiratory effort on vent; CTA B Cardiovascular: RRR, + SEM GI: soft, + distended,   Review of Systems: Unable to be assessed due to mental status  Lab Results  Component Value Date   WBC 29.8 (H) 10/16/2017   HGB 9.1 (L) 10/16/2017   HCT 29.9 (L) 10/16/2017   MCV 94.6 10/16/2017   PLT 216 10/16/2017    Lab Results  Component Value Date   CREATININE 2.09 (H) 10/16/2017   BUN 57 (H) 10/16/2017   NA 133 (L) 10/16/2017   K 3.8 10/16/2017   CL 95 (L) 10/16/2017   CO2 23 10/16/2017    Lab Results  Component Value Date   ALT 18 10/02/2017   AST 23 10/02/2017   ALKPHOS 44 10/02/2017     Microbiology: Recent Results (from the past 240 hour(s))  Urine Culture     Status: None   Collection Time: 10/06/17 10:38 AM  Result Value Ref Range Status   Specimen Description URINE, CATHETERIZED  Final   Special Requests NONE  Final   Culture   Final    NO GROWTH Performed at [facility], [address] [place], [place] [address]    Report Status 10/07/2017 FINAL  Final  Culture, respiratory (NON-Expectorated)     Status: None   Collection Time: 10/06/17 12:27 PM  Result Value Ref Range Status   Specimen Description TRACHEAL ASPIRATE  Final   Special Requests NONE  Final   Gram Stain   Final    MODERATE WBC PRESENT,BOTH PMN AND MONONUCLEAR NO ORGANISMS SEEN    Culture   Final    MODERATE Consistent with normal respiratory flora. Performed at [facility], [address] [place], [place] [address]    Report Status 10/08/2017 FINAL  Final  Culture, blood (routine x 2)     Status:  None   Collection Time: 10/07/17 11:00 AM  Result Value Ref Range Status   Specimen Description BLOOD RIGHT ANTECUBITAL  Final   Special Requests   Final    BOTTLES DRAWN AEROBIC AND ANAEROBIC Blood Culture adequate volume   Culture   Final    NO GROWTH 5 DAYS Performed at [facility], [address] [place], [place] [address]    Report Status 10/12/2017 FINAL  Final  Culture, blood (routine x 2)     Status: None   Collection Time: 10/07/17 11:15 AM  Result Value Ref Range Status   Specimen Description BLOOD RIGHT HAND  Final   Special Requests   Final    BOTTLES DRAWN AEROBIC AND ANAEROBIC Blood Culture adequate volume   Culture   Final    NO GROWTH 5 DAYS Performed at [facility], [address] [place], [place] [address]    Report Status 10/12/2017 FINAL  Final  Culture, respiratory (NON-Expectorated)     Status: None (Preliminary result)   Collection Time: 10/13/17 10:06 AM  Result Value Ref Range Status   Specimen Description TRACHEAL ASPIRATE  Final   Special Requests  NONE  Final   Gram Stain   Final    RARE WBC PRESENT, PREDOMINANTLY PMN RARE GRAM POSITIVE COCCI RARE GRAM VARIABLE ROD    Culture   Final    Consistent with normal respiratory flora. Performed at [facility], [address], [place], [place] [address]    Report Status PENDING  Incomplete  Culture, blood (Routine X 2) w Reflex to ID Panel     Status: None (Preliminary result)   Collection Time: 10/14/17  9:18 AM  Result Value Ref Range Status   Specimen Description BLOOD WRIST LEFT  Final   Special Requests   Final    BOTTLES DRAWN AEROBIC ONLY Blood Culture adequate volume   Culture   Final    NO GROWTH 1 DAY Performed at [facility], [address] [place], [place] [address]    Report Status PENDING  Incomplete  Culture, blood (Routine X 2) w Reflex to ID Panel     Status: None (Preliminary result)   Collection Time: 10/14/17  9:19 AM  Result Value Ref Range Status    Specimen Description BLOOD LEFT HAND  Final   Special Requests IN PEDIATRIC BOTTLE Blood Culture adequate volume  Final   Culture   Final    NO GROWTH 1 DAY Performed at [facility], [address] [place], [place] [address]    Report Status PENDING  Incomplete    Impression/Plan:  1. HIV -continues on renally dosed regimen and no changes.    2.  Fever - now afebrile but WBC up.  Continue current antibiotics.    3.  Renal insufficiency - continues on CRRT.

## 2017-10-16 NOTE — Care Management Note (Signed)
Case Management Note  Patient Details  Name: Chris RiversJerome A Hommes Jr. MRN: [id number] Date of Birth: 11/04/1965  Subjective/Objective:  Pt admitted with SOB secondary to flu                Action/Plan:  PTA independent from home.     Expected Discharge Date:                  Expected Discharge Plan:  Skilled Nursing Facility  In-House Referral:  Clinical Social Work  Discharge planning Services  CM Consult  Post Acute Care Choice:    Choice offered to:     DME Arranged:    DME Agency:     HH Arranged:    HH Agency:     Status of Service:     If discussed at [employer] Meetings, dates discussed:    Additional Comments: 10/16/2017  Pt remains on ventilator via trach and CRRT.  Palliative Care consulted.  CM will continue to follow for discharge needs  10/09/17 Pt was unable to wean off ventilator - pt received trach 10/09/17.  CSW informed of pending SNF placement - [facility]. Cherylann ParrClaxton, Emiel Kielty S, RN 10/16/2017, 2:35 PM

## 2017-10-16 NOTE — Progress Notes (Signed)
Pharmacy Consult Note - Anticoagulation  Pharmacy Consult for:  Heparin / Coumadin (Overlap D#4/5) Indication: pulmonary embolus/Afib  Allergies  Allergen Reactions  . Bactrim [Sulfamethoxazole-Trimethoprim] Hives    Patient Measurements: Height: 5\' 9"  (175.3 cm) Weight: 243 lb 6.2 oz (110.4 kg) IBW/kg (Calculated) : 70.7 Heparin Dosing Weight: 98 kg  Vital Signs: Temp: 98 F (36.7 C) (03/08 0747) Temp Source: Oral (03/08 0747) BP: 95/50 (03/08 0700) Pulse Rate: 58 (03/08 0700)  Height: 5\' 9"  (175.3 cm) Weight: 264 lb 1.8 oz (119.8 kg) IBW/kg (Calculated) : 70.7  Temp (24hrs), Avg:98.9 F (37.2 C), Min:98.3 F (36.8 C), Max:100.6 F (38.1 C)  Recent Labs  Lab 10/10/17 0211  10/11/17 0434 10/12/17 0450 10/12/17 1543 10/13/17 0254 10/13/17 1504 10/13/17 2138 10/14/17 0316 10/14/17 0317  WBC 10.1  --  10.8* 13.4*  --  16.2*  --   --   --  25.1*  CREATININE 4.18*   < > 5.77* 6.02* 5.09* 4.13* 3.31* 3.11* 2.89*  --    < > = values in this interval not displayed.    Estimated Creatinine Clearance: 38.6 mL/min (A) (by C-G formula based on SCr of 2.89 mg/dL (H)).    Recent Labs    10/14/17 0316  10/14/17 0317  10/15/17 0341 10/15/17 0353 10/15/17 1525 10/16/17 0412  HGB  --    < > 9.0*  --   --  8.8*  --  9.1*  HCT  --   --  29.1*  --   --  28.7*  --  29.9*  PLT  --   --  245  --   --  219  --  216  LABPROT  --   --  14.1  --   --  14.8  --  17.3*  INR  --   --  1.10  --   --  1.17  --  1.43  HEPARINUNFRC 0.70  --   --   --   --  0.35  --  0.45  CREATININE 2.89*  --   --    < > 2.37*  --  2.24* 2.09*   < > = values in this interval not displayed.    Estimated Creatinine Clearance: 51.2 mL/min (A) (by C-G formula based on SCr of 2.09 mg/dL (H)).    Assessment 51 YOM with Afib on Eliquis PTA and had a new PE while on anticoagulation.  Pharmacy has been consulted to dose heparin bridge to Coumadin.  Of note patient is currently on CRRT and receiving some  heparin through that modality.  Heparin level is therapeutic and INR is starting to trend up.  No bleeding reported.   Goal of Therapy:  Heparin level 0.3-0.7 units/ml INR 2 - 3  Monitor platelets by anticoagulation protocol: Yes   Plan Continue heparin gtt at 2850 units/hr Repeat Coumadin 15mg  PO today Daily heparin level, PT/INR, CBC   Rhyker Silversmith D. Laney Potashang, PharmD, BCPS Pager:  [phone] 10/16/2017, 7:55 AM

## 2017-10-16 NOTE — Progress Notes (Signed)
Palliative Medicine consult noted. Due to high referral volume, there may be a delay seeing this patient. Please call the Palliative Medicine Team office at [phone] if recommendations are needed in the interim.  Thank you for inviting us to see this patient.  Margret ChanceMelanie G. Sonna Lipsky, RN, BSN, [facility] Palliative Medicine Team 10/16/2017 12:54 PM Office [phone]

## 2017-10-17 ENCOUNTER — Inpatient Hospital Stay (HOSPITAL_COMMUNITY): Payer: Medicare HMO

## 2017-10-17 DIAGNOSIS — Z7189 Other specified counseling: Secondary | ICD-10-CM

## 2017-10-17 DIAGNOSIS — Z515 Encounter for palliative care: Secondary | ICD-10-CM

## 2017-10-17 LAB — CBC
HCT: 29.3 % — ABNORMAL LOW (ref 39.0–52.0)
HEMOGLOBIN: 8.9 g/dL — AB (ref 13.0–17.0)
MCH: 28.6 pg (ref 26.0–34.0)
MCHC: 30.4 g/dL (ref 30.0–36.0)
MCV: 94.2 fL (ref 78.0–100.0)
Platelets: 227 10*3/uL (ref 150–400)
RBC: 3.11 MIL/uL — AB (ref 4.22–5.81)
RDW: 14.5 % (ref 11.5–15.5)
WBC: 30.2 10*3/uL — ABNORMAL HIGH (ref 4.0–10.5)

## 2017-10-17 LAB — GLUCOSE, CAPILLARY
Glucose-Capillary: 137 mg/dL — ABNORMAL HIGH (ref 65–99)
Glucose-Capillary: 149 mg/dL — ABNORMAL HIGH (ref 65–99)
Glucose-Capillary: 167 mg/dL — ABNORMAL HIGH (ref 65–99)
Glucose-Capillary: 185 mg/dL — ABNORMAL HIGH (ref 65–99)
Glucose-Capillary: 210 mg/dL — ABNORMAL HIGH (ref 65–99)
Glucose-Capillary: 239 mg/dL — ABNORMAL HIGH (ref 65–99)

## 2017-10-17 LAB — RENAL FUNCTION PANEL
ALBUMIN: 2.2 g/dL — AB (ref 3.5–5.0)
Albumin: 2 g/dL — ABNORMAL LOW (ref 3.5–5.0)
Anion gap: 14 (ref 5–15)
Anion gap: 14 (ref 5–15)
BUN: 52 mg/dL — AB (ref 6–20)
BUN: 51 mg/dL — ABNORMAL HIGH (ref 6–20)
CHLORIDE: 96 mmol/L — AB (ref 101–111)
CHLORIDE: 95 mmol/L — AB (ref 101–111)
CO2: 24 mmol/L (ref 22–32)
CO2: 23 mmol/L (ref 22–32)
Calcium: 8.8 mg/dL — ABNORMAL LOW (ref 8.9–10.3)
Calcium: 8.4 mg/dL — ABNORMAL LOW (ref 8.9–10.3)
Creatinine, Ser: 1.91 mg/dL — ABNORMAL HIGH (ref 0.61–1.24)
Creatinine, Ser: 1.92 mg/dL — ABNORMAL HIGH (ref 0.61–1.24)
GFR calc Af Amer: 45 mL/min — ABNORMAL LOW (ref >60)
GFR calc non Af Amer: 39 mL/min — ABNORMAL LOW (ref >60)
GFR calc non Af Amer: 39 mL/min — ABNORMAL LOW (ref >60)
GFR, EST AFRICAN AMERICAN: 45 mL/min — AB (ref >60)
GLUCOSE: 156 mg/dL — AB (ref 65–99)
Glucose, Bld: 208 mg/dL — ABNORMAL HIGH (ref 65–99)
PHOSPHORUS: 5.5 mg/dL — AB (ref 2.5–4.6)
POTASSIUM: 3.6 mmol/L (ref 3.5–5.1)
POTASSIUM: 3.6 mmol/L (ref 3.5–5.1)
Phosphorus: 5.2 mg/dL — ABNORMAL HIGH (ref 2.5–4.6)
Sodium: 134 mmol/L — ABNORMAL LOW (ref 135–145)
Sodium: 132 mmol/L — ABNORMAL LOW (ref 135–145)

## 2017-10-17 LAB — HEPARIN LEVEL (UNFRACTIONATED): Heparin Unfractionated: 0.57 IU/mL (ref 0.30–0.70)

## 2017-10-17 LAB — PROTIME-INR
INR: 1.74
Prothrombin Time: 20.2 seconds — ABNORMAL HIGH (ref 11.4–15.2)

## 2017-10-17 MED ORDER — WARFARIN SODIUM 7.5 MG PO TABS
15.0000 mg | ORAL_TABLET | Freq: Once | ORAL | Status: AC
Start: 2017-10-17 — End: 2017-10-17
  Administered 2017-10-17: 15 mg via ORAL
  Filled 2017-10-17: qty 2

## 2017-10-17 MED ORDER — PREDNISONE 10 MG PO TABS
10.0000 mg | ORAL_TABLET | Freq: Every day | ORAL | Status: DC
  Administered 2017-10-18 – 2017-10-19 (×2): 10 mg
  Filled 2017-10-17 (×2): qty 1

## 2017-10-17 NOTE — Consult Note (Signed)
Consultation Note Date: 10/17/2017   Patient Name: Chris [name].  DOB: Mar 14, 1966  MRN: [id number]  Age / Sex: 52 y.o., male  PCP: System, Pcp Not In Referring Physician: Chesley Mires, MD  Reason for Consultation: Establishing goals of care and Psychosocial/spiritual support  HPI/Patient Profile: 52 y.o. male  with past medical history of HIV, atrial fib, COPD, diabetes, renal insufficiency admitted on 10/01/2017 with shortness of breath.  He was found to be flu positive, developed pneumonia which subsequently went into ARDS.  Patient was intubated.  He underwent a tracheostomy on 10/09/2017 and since that time has still been vent dependent, making minimal progress towards weaning.  He also developed oliguria, and has been on CRRT with persistent hyperkalemia.  His initial  pneumonia was improving but now he has developed another round of pneumonia and as noted not making any urine.  Consult ordered for goals of care.   Clinical Assessment and Goals of Care: Patient seen, chart reviewed.  Staffed with critical care medicine as well as bedside RN.  Patient has been in a holding pattern of not weaning successfully and not making urine.  He remains on CRRT but is a poor candidate for hemodialysis.  This gentleman has unfortunate choices awaiting him such as LTAC which may mean relocation to [place] if he is unable to obtain a bed in [facility].  At this point he is vent dependent.  He would not be able to go to [facility] however if he is unable to transition off of CRRT.  Up to this point he has been full scope of treatment  His healthcare proxy would be his wife, Chris [name], [phone]  Met with patient's wife, patient's brother, father and mother.  Family has multiple questions regarding source of his infection as well as they are inquiring about transfer to another tertiary care center.  Patient's wife  is also asking for a multidisciplinary meeting  Discussed in detail patient's care requirements if he is unable to wean off of the vent, family would be looking at an LTAC which could potentially not only be out of town but out of state.  Also reviewed the continuous hemodialysis treatment that he is undergoing that that is another aggressive intervention that can only be done in an ICU setting versus long-term care setting.  Family is asking why we are not transferring him to another institution if we"re not able to make him "better"  SUMMARY OF RECOMMENDATIONS   Continue full code; full scope of treatment Wife would like to have a meeting with all members of her husband's care team including CCM, palliative medicine, cardiology (if they are still on board), nephrology.  Palliative medicine would be happy to help arrange this multidisciplinary meeting.  Mrs. Looper shares that she can be very flexible with her time in terms of what works well for her husband's physicians Family would like to pursue transfer to [facility] as soon as possible  Code Status/Advance Care Planning:  Full code    Symptom Management:  Shortness of breath: Patient has been on a fentanyl continuous infusion but this is on hold now in an attempt to help him wean.  Should patient become tachypnea, recommend restarting fentanyl  Agitation: Continue Precedex as managed by CCM  Palliative Prophylaxis:   Aspiration, Bowel Regimen, Delirium Protocol, Eye Care, Frequent Pain Assessment, Oral Care and Turn Reposition  Additional Recommendations (Limitations, Scope, Preferences):  Full Scope Treatment  Psycho-social/Spiritual:   Desire for further Chaplaincy support:no  Additional Recommendations: Referral to Community Resources   Prognosis:   Unable to determine  Discharge Planning: To Be Determined      Primary Diagnoses: Present on Admission: . Acute on chronic respiratory failure with hypoxia ([place]) . Diabetes  mellitus type 2 in obese ([place]) . COPD with exacerbation ([place]) . Atrial fibrillation ([place]) . HIV (human immunodeficiency virus infection) ([place]) . Tobacco abuse . Bronchitis . COPD exacerbation ([place])   I have reviewed the medical record, interviewed the patient and family, and examined the patient. The following aspects are pertinent.  Past Medical History:  Diagnosis Date  . Atrial fibrillation ([place])   . COPD (chronic obstructive pulmonary disease) ([place])    Emphysema [[name]]  . Depression   . Diabetes mellitus without complication ([place])   . GERD (gastroesophageal reflux disease)   . HIV disease ([place])    Social History   Socioeconomic History  . Marital status: Married    Spouse name: None  . Number of children: 0  . Years of education: None  . Highest education level: None  Social Needs  . Financial resource strain: None  . Food insecurity - worry: None  . Food insecurity - inability: None  . Transportation needs - medical: None  . Transportation needs - non-medical: None  Occupational History  . Occupation: Unemployed  Tobacco Use  . Smoking status: Current Some Day Smoker    Packs/day: 0.10    Years: 40.00    Pack years: 4.00    Types: Cigarettes  . Smokeless tobacco: Never Used  . Tobacco comment: smokes 3/ day since d/c from hospital  Substance and Sexual Activity  . Alcohol use: No    Alcohol/week: 0.0 oz  . Drug use: No    Comment: quit 04  . Sexual activity: Yes    Partners: Female    Birth control/protection: Condom    Comment: given condoms  Other Topics Concern  . None  Social History Narrative  . None   Family History  Problem Relation Age of Onset  . Throat cancer Father 90  . Emphysema Maternal Uncle        was a smoker  . Diabetes Maternal Uncle   . Heart disease Maternal Uncle   . COPD Maternal Uncle   . Asthma Maternal Uncle   . Diabetes Maternal Uncle   . Asthma Maternal Aunt   . Diabetes Maternal Aunt   . Heart disease Maternal  Aunt   . Throat cancer Maternal Grandmother        never smoker, used snuff  . Diabetes Maternal Grandmother   . Breast cancer Maternal Aunt    Scheduled Meds: . amiodarone  200 mg Per Tube BID   Followed by  . [START ON 10/18/2017] amiodarone  200 mg Per Tube Daily  . chlorhexidine gluconate (MEDLINE KIT)  15 mL Mouth Rinse BID  . Chlorhexidine Gluconate Cloth  6 each Topical Daily  . clonazePAM  0.5 mg Per Tube QHS  . diltiazem  60 mg Per Tube Q6H  .  docusate  100 mg Per Tube BID  . dolutegravir  50 mg Oral Daily  . emtricitabine  200 mg Oral Once every 4 days  . famotidine  20 mg Per Tube QHS  . feeding supplement (VITAL HIGH PROTEIN)  1,000 mL Per Tube Q24H  . insulin aspart  0-20 Units Subcutaneous Q4H  . insulin aspart  5 Units Subcutaneous Q4H  . insulin glargine  35 Units Subcutaneous BID  . ipratropium-albuterol  3 mL Nebulization Q6H  . mouth rinse  15 mL Mouth Rinse 10 times per day  . polyethylene glycol  17 g Oral Daily  . [START ON 10/18/2017] predniSONE  10 mg Per Tube Q breakfast  . sennosides  5 mL Per Tube QHS  . sodium chloride flush  10-40 mL Intracatheter Q12H  . sodium chloride flush  3 mL Intravenous Q12H  . tenofovir  300 mg Oral Q Wed  . warfarin  15 mg Oral ONCE-1800  . Warfarin - Pharmacist Dosing Inpatient   Does not apply q1800   Continuous Infusions: . sodium chloride 500 mL (10/14/17 1442)  . sodium chloride    . sodium chloride    . dexmedetomidine (PRECEDEX) IV infusion 1.2 mcg/kg/hr (10/17/17 1000)  . fentaNYL infusion INTRAVENOUS 150 mcg/hr (10/17/17 1000)  . heparin 2,850 Units/hr (10/17/17 0800)  . heparin    . piperacillin-tazobactam 3.375 g (10/17/17 0624)  . dialysate (PRISMASATE) 2,000 mL/hr at 10/17/17 0849  . dialysis replacement fluid (prismasate) 500 mL/hr at 10/17/17 0040  . dialysis replacement fluid (prismasate) 500 mL/hr at 10/17/17 0040   PRN Meds:.sodium chloride, sodium chloride, sodium chloride, acetaminophen (TYLENOL)  oral liquid 160 mg/5 mL, alteplase, bisacodyl, fentaNYL, heparin, heparin, heparin, hydrALAZINE, lidocaine (PF), lidocaine-prilocaine, metoprolol tartrate, midazolam, ondansetron (ZOFRAN) IV, pentafluoroprop-tetrafluoroeth, sodium chloride flush Medications Prior to Admission:  Prior to Admission medications   Medication Sig Start Date End Date Taking? Authorizing Provider  acetaminophen-codeine (TYLENOL #3) 300-30 MG tablet Take 1 tablet by mouth every 8 (eight) hours as needed for moderate pain. 09/02/17 12/01/17 Yes Tresa Garter, MD  albuterol (PROVENTIL HFA;VENTOLIN HFA) 108 (90 Base) MCG/ACT inhaler Inhale 2 puffs into the lungs every 4 (four) hours as needed for wheezing or shortness of breath (((PLAN B))). 09/02/17  Yes Jegede, Olugbemiga E, MD  apixaban (ELIQUIS) 5 MG TABS tablet Take 1 tablet (5 mg total) by mouth 2 (two) times daily. 09/02/17  Yes Tresa Garter, MD  bictegravir-emtricitabine-tenofovir AF (BIKTARVY) 50-200-25 MG TABS tablet Take 1 tablet by mouth daily. Patient taking differently: Take 1 tablet by mouth daily at 3 pm.  03/31/17  Yes Carlyle Basques, MD  clotrimazole-betamethasone (LOTRISONE) cream Apply 1 application topically 2 (two) times daily. 09/21/17  Yes Edrick Kins, DPM  diltiazem (CARDIZEM CD) 360 MG 24 hr capsule Take 1 capsule (360 mg total) by mouth daily. 09/02/17  Yes Tresa Garter, MD  furosemide (LASIX) 20 MG tablet Take 1 tablet (20 mg total) by mouth daily. 09/02/17  Yes Tresa Garter, MD  metFORMIN (GLUCOPHAGE) 1000 MG tablet Take 1 tablet (1,000 mg total) by mouth 2 (two) times daily with a meal. 09/02/17  Yes Tresa Garter, MD  SPIRIVA RESPIMAT 2.5 MCG/ACT AERS INHALE 2 PUFFS INTO THE LUNGS DAILY 08/10/17  Yes Tanda Rockers, MD  terbinafine (LAMISIL) 250 MG tablet Take 1 tablet (250 mg total) by mouth daily. 09/21/17  Yes Edrick Kins, DPM  Testosterone 12.5 MG/ACT (1%) GEL Place 1 Pump onto the skin daily. 09/25/17  Yes  Carlyle Basques, MD  atorvastatin (LIPITOR) 20 MG tablet Take 1 tablet (20 mg total) by mouth daily. 09/02/17   Tresa Garter, MD  budesonide-formoterol (SYMBICORT) 80-4.5 MCG/ACT inhaler Take 2 puffs first thing in am and then another 2 puffs about 12 hours later. 09/22/17   Tanda Rockers, MD  clotrimazole (LOTRIMIN) 1 % cream Apply 1 application topically 2 (two) times daily. 08/26/16   Carlyle Basques, MD  clotrimazole (MYCELEX) 10 MG troche Take 1 tablet (10 mg total) by mouth 5 (five) times daily. 09/22/17   Tanda Rockers, MD  ipratropium-albuterol (DUONEB) 0.5-2.5 (3) MG/3ML SOLN Take 3 mLs by nebulization every 6 (six) hours as needed. 09/02/17   Tresa Garter, MD  OXYGEN Place 2 L into the nose as needed (shortness of breath).     [provider]  pantoprazole (PROTONIX) 40 MG tablet Take 1 tablet (40 mg total) by mouth 2 (two) times daily before a meal. Patient taking differently: Take 40 mg by mouth 3 times/day as needed-between meals & bedtime.  09/02/17   Tresa Garter, MD  sildenafil (VIAGRA) 50 MG tablet Take 0.5 tablets (25 mg total) by mouth daily as needed for erectile dysfunction. 06/18/15   Carlyle Basques, MD  urea (CARMOL) 20 % cream Apply as needed topically. 06/23/17   Gardiner Barefoot, DPM   Allergies  Allergen Reactions  . Bactrim [Sulfamethoxazole-Trimethoprim] Hives   Review of Systems  Unable to perform ROS: Intubated    Physical Exam  Constitutional: He appears well-developed and well-nourished.  Well-nourished, acutely ill-appearing older man seen in ICU.  He will focus on you when you try to speak to him, follows simple commands  Cardiovascular: Normal rate.  Pulmonary/Chest:  Patient has a tracheostomy Still on ventilator support with minimal progress made towards weaning  Abdominal: Soft.  Neurological: He is alert.  Unable to assess orientation.  His eyes are open, following simple commands such as squeeze my hand  Skin: Skin is  warm and dry.  Psychiatric:  Unable to test Still on Precedex  Nursing note and vitals reviewed.   Vital Signs: BP (!) 151/67   Pulse 85   Temp 98.8 F (37.1 C) (Oral)   Resp (!) 21   Ht 5' 9" (1.753 m)   Wt 107.7 kg (237 lb 7 oz)   SpO2 95%   BMI 35.06 kg/m  Pain Assessment: CPOT   Pain Score: Asleep   SpO2: SpO2: 95 % O2 Device:SpO2: 95 % O2 Flow Rate: .O2 Flow Rate (L/min): 100 L/min  IO: Intake/output summary:   Intake/Output Summary (Last 24 hours) at 10/17/2017 1048 Last data filed at 10/17/2017 1000 Gross per 24 hour  Intake 3082.53 ml  Output 4316 ml  Net -1233.47 ml    LBM: Last BM Date: 10/17/17(smear) Baseline Weight: Weight: 119.4 kg (263 lb 3.7 oz) Most recent weight: Weight: 107.7 kg (237 lb 7 oz)     Palliative Assessment/Data:   Flowsheet Rows     Most Recent Value  Intake Tab  Referral Department  Critical care  Unit at Time of Referral  ICU  Palliative Care Primary Diagnosis  Pulmonary  Date Notified  10/15/17  Palliative Care Type  New Palliative care  Reason for referral  Clarify Goals of Care  Date of Admission  10/01/17  Date first seen by Palliative Care  10/17/17  # of days Palliative referral response time  2 Day(s)  # of days IP prior to Palliative referral  14  Clinical Assessment  Palliative Performance Scale Score  30%  Pain Max last 24 hours  Not able to report  Pain Min Last 24 hours  Not able to report  Dyspnea Max Last 24 Hours  Not able to report  Dyspnea Min Last 24 hours  Not able to report  Nausea Max Last 24 Hours  Not able to report  Nausea Min Last 24 Hours  Not able to report  Anxiety Max Last 24 Hours  Not able to report  Anxiety Min Last 24 Hours  Not able to report  Other Max Last 24 Hours  Not able to report  Psychosocial & Spiritual Assessment  Palliative Care Outcomes  Palliative Care follow-up planned  Yes, Facility      Time In: 1330 Time Out: 1445 Time Total: 75 min Greater than 50%  of this time  was spent counseling and coordinating care related to the above assessment and plan.  Signed by: Dory Horn, NP   Please contact Palliative Medicine Team phone at [phone] for questions and concerns.  For individual provider: See Shea Evans

## 2017-10-17 NOTE — Progress Notes (Signed)
Pharmacy Antibiotic Note  Ruel FavorsJerome A Guglielmo [name] is a 52 y.o. male admitted on 10/01/2017 with possible HCAP.   Pharmacy has been consulted for Zosyn dosing. Patient is currently on D#4 of antibiotics after re-start for possible HCAP. Patient remains afebrile and WBC up to 30.2 today. Patient continues on CRRT and tolerating well.   Plan: Continue Zosyn 3.375 gm every 6 hours over 30 min while on CRRT F/U cultures, clinical s/sx of infection, length of therapy  Height: 5\' 9"  (175.3 cm) Weight: 237 lb 7 oz (107.7 kg) IBW/kg (Calculated) : 70.7  Temp (24hrs), Avg:98.5 F (36.9 C), Min:97.8 F (36.6 C), Max:98.9 F (37.2 C)  Recent Labs  Lab 10/13/17 0254  10/14/17 0317  10/15/17 0341 10/15/17 0353 10/15/17 1525 10/16/17 0412 10/16/17 1359 10/17/17 0435  WBC 16.2*  --  25.1*  --   --  24.4*  --  29.8*  --  30.2*  CREATININE 4.13*   < >  --    < > 2.37*  --  2.24* 2.09* 2.03* 1.91*   < > = values in this interval not displayed.    Estimated Creatinine Clearance: 55.3 mL/min (A) (by C-G formula based on SCr of 1.91 mg/dL (H)).    Allergies  Allergen Reactions  . Bactrim [Sulfamethoxazole-Trimethoprim] Hives    Sharin MonsEmily Curley Fayette, PharmD, BCPS PGY2 Infectious Diseases Pharmacy Resident Pager: [phone]  10/17/2017 9:49 AM

## 2017-10-17 NOTE — Progress Notes (Signed)
[facility] Rounding Note  Subjective/Interval History:   HD 3/3; CRRT 3/4  2/2 hyperkalemia, marked azotemia, high O2 requirement 500/500/2000 4/4/0 K (3/7) Effluent dose 20 ml/kg/hour Temp cath 3/3 R IJ Current fluid removal 0-100   Changed dialysate to 4K yesterday, K drifted up, back on 0K dialysate Pre/post 4K  Objective Vital signs in last 24 hours: Vitals:   10/17/17 0700 10/17/17 0720 10/17/17 0730 10/17/17 0757  BP: 133/65  128/67   Pulse: 76 72 71   Resp: _0 Temp:    98.8 F (37.1 C)  TempSrc:    Oral  SpO2: 95% 93% 93%   Weight:      Height:       Weight change: -2.7 kg (-15.2 oz)  Intake/Output Summary (Last 24 hours) at 10/17/2017 0803 Last data filed at 10/17/2017 0700 Gross per 24 hour  Intake 3149.53 ml  Output 4311 ml  Net -1161.47 ml   Physical Exam:  Blood pressure 128/67, pulse 71, temperature 98.8 F (37.1 C), temperature source Oral, resp. rate 20, height _1  (1.753 m), weight 107.7 kg (237 lb 7 oz), SpO2 93 %.   Awakens, seems to nod appropriately R IJ temp HD cath (3/3) Trach/vent Coarse rhonchous breath sounds Tachy S1S2 NoS3 Obese abdomen, + BS still tight SCD's in place Wrinkling skin of feet Pitting edema LE's resolved  Weight trending              10/17/17 107.7 kg   10/16/17 110.4 kg   10/15/17  114 kg   10/15/15 119.8 k   10/13/17 122.5 kg   10/12/17  130.8 kg  CRRT started  10/11/17  127.6 kg    10/10/17  126.1      Recent Labs  Lab 10/14/17 1514 10/14/17 2352 10/15/17 0341 10/15/17 1525 10/16/17 0412 10/16/17 1359 10/17/17 0435  NA 133* 133* 134* 132* 133* 135 134*  K 6.1* 5.6* 5.2* 4.6 3.8 5.1 3.6  CL 96* 97* 97* 95* 95* 97* 96*  CO2 _2 GLUCOSE 190* 194* 177* 201* 186* 164* 156*  BUN 71* 68* 66* 60* 57* 56* 52*  CREATININE 2.54* 2.46* 2.37* 2.24* 2.09* 2.03* 1.91*  CALCIUM 8.8* 8.5* 8.4* 8.4* 8.7* 8.9 8.8*  PHOS 7.4* 7.5* 7.2* 6.8* 6.2* 7.2* 5.5*    Recent Labs  Lab 10/16/17 0412  10/16/17 1359 10/17/17 0435  ALBUMIN 2.3* 2.1* 2.2*    Recent Labs  Lab 10/11/17 0434 10/12/17 0450 10/13/17 0254 10/14/17 0317 10/15/17 0353 10/16/17 0412 10/17/17 0435  WBC 10.8* 13.4* 16.2* 25.1* 24.4* 29.8* 30.2*  NEUTROABS 8.5* 12.4* 15.6*  --   --   --   --   HGB 9.4* 9.2* 8.5* 9.0* 8.8* 9.1* 8.9*  HCT 30.6* 29.6* 27.0* 29.1* 28.7* 29.9* 29.3*  MCV 92.7 91.4 90.6 92.7 94.4 94.6 94.2  PLT 150 207 213 245 219 216 227     Recent Labs  Lab 10/16/17 1119 10/16/17 1546 10/16/17 1946 10/16/17 2333 10/17/17 0340  GLUCAP 139* 204* 210* 134* 137*    Studies/Results: Dg Chest Portable 1 View  Result Date: 10/16/2017 CLINICAL DATA:  Respiratory failure EXAM: PORTABLE CHEST 1 VIEW COMPARISON:  10/14/2017 FINDINGS: Tracheostomy in good position. Bilateral central venous catheter tips in the SVC unchanged. Feeding tube in place with the tip not visualized. Cardiac enlargement with mild vascular congestion which has improved in the interval. Improvement in bibasilar airspace disease. No pleural effusion IMPRESSION: Improvement in bilateral  airspace disease. Support lines remain in good position. Electronically Signed   By: Franchot Gallo M.D.   On: 10/16/2017   Dg Chest Port 1 View  Result Date: 10/17/2017 CLINICAL DATA:  Respiratory failure. EXAM: PORTABLE CHEST 1 VIEW COMPARISON:  10/16/2017 FINDINGS: Tracheostomy tube overlies the airway. Bilateral jugular catheters terminate over the upper SVC. Feeding tube courses into the left upper abdomen with tip not imaged. The cardiac silhouette remains enlarged. Mild diffuse prominence of the interstitial markings is unchanged and may be at least in part chronic and secondary to known emphysema. Mild patchy basilar opacities are minimally increased on the right and unchanged on the left. No overt edema, sizable pleural effusion, or pneumothorax is identified. IMPRESSION: Mild bibasilar opacities, minimally increased on the right and which may  reflect atelectasis. Electronically Signed   By: Logan Bores M.D.   On: 10/17/2017 07:41   Medications: . sodium chloride 500 mL (10/14/17 1442)  . sodium chloride    . sodium chloride    . dexmedetomidine (PRECEDEX) IV infusion 0.4 mcg/kg/hr (10/17/17 0210)  . fentaNYL infusion INTRAVENOUS 150 mcg/hr (10/17/17 0714)  . heparin 2,850 Units/hr (10/17/17 0710)  . heparin    . piperacillin-tazobactam 3.375 g (10/17/17 0624)  . dialysate (PRISMASATE) 2,000 mL/hr at 10/17/17 0617  . dialysis replacement fluid (prismasate) 500 mL/hr at 10/17/17 0040  . dialysis replacement fluid (prismasate) 500 mL/hr at 10/17/17 0040   Medications . amiodarone  200 mg Per Tube BID   Followed by  . [START ON 10/18/2017] amiodarone  200 mg Per Tube Daily  . chlorhexidine gluconate (MEDLINE KIT)  15 mL Mouth Rinse BID  . Chlorhexidine Gluconate Cloth  6 each Topical Daily  . clonazePAM  0.5 mg Per Tube QHS  . coumadin book   Does not apply Once  . diltiazem  60 mg Per Tube Q6H  . docusate  100 mg Per Tube BID  . dolutegravir  50 mg Oral Daily  . emtricitabine  200 mg Oral Once every 4 days  . famotidine  20 mg Per Tube QHS  . feeding supplement (VITAL HIGH PROTEIN)  1,000 mL Per Tube Q24H  . insulin aspart  0-20 Units Subcutaneous Q4H  . insulin aspart  5 Units Subcutaneous Q4H  . insulin glargine  35 Units Subcutaneous BID  . ipratropium-albuterol  3 mL Nebulization Q6H  . mouth rinse  15 mL Mouth Rinse 10 times per day  . polyethylene glycol  17 g Oral Daily  . predniSONE  20 mg Per Tube Q breakfast  . sennosides  5 mL Per Tube QHS  . sodium chloride flush  10-40 mL Intracatheter Q12H  . sodium chloride flush  3 mL Intravenous Q12H  . tenofovir  300 mg Oral Q Wed  . Warfarin - Pharmacist Dosing Inpatient   Does not apply q1800    Background: 52 yo AAM HIV AF COPD D< MO Admitted w/resp failure, VDRF. Initially + Flu A, PNA; possible PE;  Baseline creatinine 0.79 10/01/17. AKI in setting of above  issues. HDX1 3/4 for volume, but markedly catabolic/recurrent [name], massively overloaded. CRRT initiated 10/12/17 for massive anasarca /hyperkalemia. R IJ temp cath 3/3 by CCM.   Assessment/Recommendations  1. AKI - normal creatinine at baseline. Markedly catabolic. CRRT inititated 3/4. Weight down 23 kg (if weights accurate). CVP 7. Change fluid removal to keep volume even. Still requiring 0K dialysate. Afraid if tried to transition to IHD would not be able to manage K just yet.  2. Hyperkalemia - necessitated use of all 0 K fluids for a while, currently just dialysate. Tried to change to 4K dialysiate on 3/8 - K drifted up. Now back on 0K. 3. VDRF/COPD/ARDS/FluA+ - finished Tamiflu/ATB's; on steroids. Per CCM 4. HIV - meds; ID following 5. Esophageal candidiasis - noted during intubation. IV antifungals done  6. Fevers/leukocytosis. CCM/ID addressing. Vanco and zosyn for possible PNA 7. AF - amio/heparin/low dose dilt 8. DM - per CCM 9. Encephalopathy - more responsive, sedation ^ d/t agitation. Per nursing follows some commands.  Jamal Maes, MD [facility] [phone] pager 10/17/2017, 8:03 AM

## 2017-10-17 NOTE — Progress Notes (Signed)
Pharmacy Consult Note - Anticoagulation  Pharmacy Consult for:  Heparin / Coumadin (Overlap D#5) Indication: pulmonary embolus/Afib  Allergies  Allergen Reactions  . Bactrim [Sulfamethoxazole-Trimethoprim] Hives    Patient Measurements: Height: 5\' 9"  (175.3 cm) Weight: 237 lb 7 oz (107.7 kg) IBW/kg (Calculated) : 70.7 Heparin Dosing Weight: 98 kg  Vital Signs: Temp: 98.8 F (37.1 C) (03/09 0757) Temp Source: Oral (03/09 0757) BP: 161/60 (03/09 0930) Pulse Rate: 89 (03/09 0930)  Height: 5\' 9"  (175.3 cm) Weight: 264 lb 1.8 oz (119.8 kg) IBW/kg (Calculated) : 70.7  Temp (24hrs), Avg:98.9 F (37.2 C), Min:98.3 F (36.8 C), Max:100.6 F (38.1 C)  Recent Labs  Lab 10/10/17 0211  10/11/17 0434 10/12/17 0450 10/12/17 1543 10/13/17 0254 10/13/17 1504 10/13/17 2138 10/14/17 0316 10/14/17 0317  WBC 10.1  --  10.8* 13.4*  --  16.2*  --   --   --  25.1*  CREATININE 4.18*   < > 5.77* 6.02* 5.09* 4.13* 3.31* 3.11* 2.89*  --    < > = values in this interval not displayed.    Estimated Creatinine Clearance: 38.6 mL/min (A) (by C-G formula based on SCr of 2.89 mg/dL (H)).    Recent Labs    10/15/17 0353  10/16/17 0412 10/16/17 1359 10/17/17 0435  HGB 8.8*  --  9.1*  --  8.9*  HCT 28.7*  --  29.9*  --  29.3*  PLT 219  --  216  --  227  LABPROT 14.8  --  17.3*  --  20.2*  INR 1.17  --  1.43  --  1.74  HEPARINUNFRC 0.35  --  0.45  --  0.57  CREATININE  --    < > 2.09* 2.03* 1.91*   < > = values in this interval not displayed.    Estimated Creatinine Clearance: 55.3 mL/min (A) (by C-G formula based on SCr of 1.91 mg/dL (H)).    Assessment Chris [name] with Afib on Eliquis PTA and had a new PE while on anticoagulation.  Pharmacy has been consulted to dose heparin bridge to Coumadin.  Of note patient is currently on CRRT and receiving some heparin through that modality.  Heparin level remains  Therapeutic on 2850 units/hr and INR continues to trend up. H/H is slightly down  but platelets are stable. No signs of bleeding have been noted.   Patient is on amiodarone which can increase warfarin levels- Will monitor closely.   Goal of Therapy:  Heparin level 0.3-0.7 units/ml INR 2 - 3  Monitor platelets by anticoagulation protocol: Yes   Plan Continue heparin gtt at 2850 units/hr Repeat Coumadin 15mg  PO today Daily heparin level, PT/INR, CBC Monitor for signs/symptoms of bleeding   Sharin MonsEmily Fredna Stricker, PharmD, BCPS PGY2 Infectious Diseases Pharmacy Resident Pager: [phone]  10/17/2017, 9:39 AM

## 2017-10-17 NOTE — Progress Notes (Signed)
PULMONARY / CRITICAL CARE MEDICINE   Name: Chris RiversJerome A Coury Jr. MRN: [id number] DOB: 04/30/1966    ADMISSION DATE:  10/01/2017 CONSULTATION DATE:  2/21  REFERRING MD:  Juleen ChinaKohut  CHIEF COMPLAINT:  SOB  HISTORY OF PRESENT ILLNESS:   52 yo male presented with dyspnea, cough, fever, hypoxia.  Found to have Influenza PNA causing ARDS.  Course complicated by hypervolemia and renal failure.  PMHx of HIV, A fib, COPD, DM.   SUBJECTIVE:  Still on CRRT.  VITAL SIGNS: BP (!) 155/67   Pulse 81   Temp 98.8 F (37.1 C) (Oral)   Resp 19   Ht 5\' 9"  (1.753 m)   Wt 237 lb 7 oz (107.7 kg)   SpO2 96%   BMI 35.06 kg/m   HEMODYNAMICS: CVP:  [7 mmHg-14 mmHg] 7 mmHg  VENTILATOR SETTINGS: Vent Mode: PCV FiO2 (%):  [60 %] 60 % Set Rate:  [16 bmp] 16 bmp PEEP:  [10 cmH20] 10 cmH20 Plateau Pressure:  [28 cmH20] 28 cmH20  INTAKE / OUTPUT: I/O last 3 completed shifts: In: 4863.4 [I.V.:2105.9; NG/GT:2560; IV Piggyback:197.5] Out: 7641 [Other:7641]  PHYSICAL EXAMINATION:  General - more alert Eyes - pupils reactive ENT - Trach site clean Cardiac - regular, no murmur Chest - no wheeze, rales Abd - soft, non tender Ext - no edema Skin - no rashes Neuro - follows simple commands  LABS:  BMET Recent Labs  Lab 10/16/17 0412 10/16/17 1359 10/17/17 0435  NA 133* 135 134*  K 3.8 5.1 3.6  CL 95* 97* 96*  CO2 23 26 24   BUN 57* 56* 52*  CREATININE 2.09* 2.03* 1.91*  GLUCOSE 186* 164* 156*   Electrolytes Recent Labs  Lab 10/12/17 0450  10/13/17 0254  10/14/17 0317  10/16/17 0412 10/16/17 1359 10/17/17 0435  CALCIUM 8.1*   < > 8.5*   < >  --    < > 8.7* 8.9 8.8*  MG 3.1*  --  3.0*  --  3.0*  --   --   --   --   PHOS 9.7*   < > 7.2*   < > 6.5*   < > 6.2* 7.2* 5.5*   < > = values in this interval not displayed.   CBC Recent Labs  Lab 10/15/17 0353 10/16/17 0412 10/17/17 0435  WBC 24.4* 29.8* 30.2*  HGB 8.8* 9.1* 8.9*  HCT 28.7* 29.9* 29.3*  PLT 219 216 227    Coag's Recent Labs  Lab 10/15/17 0353 10/16/17 0412 10/17/17 0435  INR 1.17 1.43 1.74    Sepsis Markers Recent Labs  Lab 10/14/17 0918 10/15/17 0353 10/16/17 0412  PROCALCITON 11.32 6.91 8.65    ABG Recent Labs  Lab 10/11/17 2331 10/13/17 0550 10/13/17 0950  PHART 7.309* 7.261* 7.315*  PCO2ART 48.6* 57.9* 51.7*  PO2ART 68.0* 194* 78.3*    Liver Enzymes Recent Labs  Lab 10/16/17 0412 10/16/17 1359 10/17/17 0435  ALBUMIN 2.3* 2.1* 2.2*    Cardiac Enzymes No results for input(s): TROPONINI, PROBNP in the last 168 hours.  Glucose Recent Labs  Lab 10/16/17 1119 10/16/17 1546 10/16/17 1946 10/16/17 2333 10/17/17 0340 10/17/17 0759  GLUCAP 139* 204* 210* 134* 137* 149*    Imaging Dg Chest Port 1 View  Result Date: 10/17/2017 CLINICAL DATA:  Respiratory failure. EXAM: PORTABLE CHEST 1 VIEW COMPARISON:  10/16/2017 FINDINGS: Tracheostomy tube overlies the airway. Bilateral jugular catheters terminate over the upper SVC. Feeding tube courses into the left upper abdomen with tip not imaged. The  cardiac silhouette remains enlarged. Mild diffuse prominence of the interstitial markings is unchanged and may be at least in part chronic and secondary to known emphysema. Mild patchy basilar opacities are minimally increased on the right and unchanged on the left. No overt edema, sizable pleural effusion, or pneumothorax is identified. IMPRESSION: Mild bibasilar opacities, minimally increased on the right and which may reflect atelectasis. Electronically Signed   By: Sebastian Ache M.D.   On: 10/17/2017 07:41    STUDIES:  Echo 2/25 >> EF 60 to 65%, mild MR Renal u/s 2/28 >> slight fullness of renal collecting system  CULTURES: Respiratory viral panel 2/21 >> Influenza A H3 Blood 2/21 >> negative Sputum 2/22 >> oral flora Urine 2/26 >> negative Sputum 2/26 >> oral flora Blood 2/27 >> negative Sputum 3/5>>> oral flora Blood 3/6>>>  ANTIBIOTICS: Vancomycin 2/21  >>2/22 Cefepime 2/21 >> 2/28 Tamiflu 2/21 >> 2/26 Fluconazole 2/21 >> 2/27 Anidulafungin 2/27 >>3/6 Zithromax 2/27 >> 3/1  SIGNIFICANT EVENTS: 2/21 Admit 2/22 VDRF 2/27 ID consulted for persistent fever 2/28 Renal consult for AKI, Cardiology consult for A fib with RVR 3/03 Attempted HD 3/04 Changed to CRRT 3/05 Worsening hypoxia >> vent settings adjusted  LINES/TUBES: ETT 2/22 >> 3/1 Left IJ 2/27 >>> Trach 3/01 >>> R IJ HD cath 3/03 >>>  DISCUSSION: 51yom with acute respiratory failure r/t flu/ARDS/PNA with superimposed AECOPD c/b AFib with RVR, AKI and marked volume overload.   ASSESSMENT / PLAN:  Acute hypoxic/hypercapnic respiratory failure from Influenza PNA. Hx of COPD with emphysema. Purulent tracheobronchitis. - pressure control ventilation - not ready for pressure support weaning - adjust PEEP/FiO2 to keep SpO2 88 to 95% - f/u CXR intermittently - wean off prednisone - scheduled BDs  A fib with RVR. Acute on chronic diastolic CHF with hx of HTN. - continue amiodarone, cardizem - prn hydralazine - transitioning to coumadin  Acute renal failure 2nd to ATN. Hyperkalemia. - CRRT per renal  Anemia of critical illness and chronic disease. - f/u CBC  Purulent tracheobronchitis. Esophageal candidiasis. Hx of HIV. - ABx per ID - continue emtriva, tivicay, tenofivir  DM type II. - SSI with lantus  Acute metabolic encephalopathy >> improved 3/09. - klonopin to 0.5 mg qhs - RASS goal 0  - precedex, fentanyl gtt  DVT prophylaxis - heparin gtt with transition to coumadin SUP - pepcid Nutrition - tube feeds Goals of care - full code.  Palliative care consulted >> family meeting set for 3/09.  Coralyn Helling, MD [facility] Pulmonary/Critical Care 10/17/2017, 9:05 AM Pager:  [phone] After 3pm call: [phone]

## 2017-10-18 DIAGNOSIS — Z515 Encounter for palliative care: Secondary | ICD-10-CM

## 2017-10-18 LAB — RENAL FUNCTION PANEL
Albumin: 2 g/dL — ABNORMAL LOW (ref 3.5–5.0)
Albumin: 2.2 g/dL — ABNORMAL LOW (ref 3.5–5.0)
Anion gap: 12 (ref 5–15)
Anion gap: 10 (ref 5–15)
BUN: 46 mg/dL — ABNORMAL HIGH (ref 6–20)
BUN: 30 mg/dL — AB (ref 6–20)
CALCIUM: 8.7 mg/dL — AB (ref 8.9–10.3)
CHLORIDE: 96 mmol/L — AB (ref 101–111)
CO2: 25 mmol/L (ref 22–32)
CO2: 26 mmol/L (ref 22–32)
CREATININE: 1.9 mg/dL — AB (ref 0.61–1.24)
Calcium: 8.6 mg/dL — ABNORMAL LOW (ref 8.9–10.3)
Chloride: 98 mmol/L — ABNORMAL LOW (ref 101–111)
Creatinine, Ser: 1.25 mg/dL — ABNORMAL HIGH (ref 0.61–1.24)
GFR calc Af Amer: >60 mL/min (ref >60)
GFR, EST AFRICAN AMERICAN: 46 mL/min — AB (ref >60)
GFR, EST NON AFRICAN AMERICAN: 39 mL/min — AB (ref >60)
GLUCOSE: 104 mg/dL — AB (ref 65–99)
Glucose, Bld: 131 mg/dL — ABNORMAL HIGH (ref 65–99)
Phosphorus: 5.1 mg/dL — ABNORMAL HIGH (ref 2.5–4.6)
Phosphorus: 3.1 mg/dL (ref 2.5–4.6)
Potassium: 3.3 mmol/L — ABNORMAL LOW (ref 3.5–5.1)
Potassium: 3.5 mmol/L (ref 3.5–5.1)
SODIUM: 135 mmol/L (ref 135–145)
Sodium: 132 mmol/L — ABNORMAL LOW (ref 135–145)

## 2017-10-18 LAB — PROTIME-INR
INR: 2.35
Prothrombin Time: 25.6 seconds — ABNORMAL HIGH (ref 11.4–15.2)

## 2017-10-18 LAB — GLUCOSE, CAPILLARY
GLUCOSE-CAPILLARY: 128 mg/dL — AB (ref 65–99)
GLUCOSE-CAPILLARY: 95 mg/dL (ref 65–99)
Glucose-Capillary: 112 mg/dL — ABNORMAL HIGH (ref 65–99)
Glucose-Capillary: 127 mg/dL — ABNORMAL HIGH (ref 65–99)
Glucose-Capillary: 127 mg/dL — ABNORMAL HIGH (ref 65–99)
Glucose-Capillary: 173 mg/dL — ABNORMAL HIGH (ref 65–99)

## 2017-10-18 LAB — CBC
HCT: 28.3 % — ABNORMAL LOW (ref 39.0–52.0)
Hemoglobin: 8.5 g/dL — ABNORMAL LOW (ref 13.0–17.0)
MCH: 28.8 pg (ref 26.0–34.0)
MCHC: 30 g/dL (ref 30.0–36.0)
MCV: 95.9 fL (ref 78.0–100.0)
Platelets: 209 10*3/uL (ref 150–400)
RBC: 2.95 MIL/uL — ABNORMAL LOW (ref 4.22–5.81)
RDW: 15.2 % (ref 11.5–15.5)
WBC: 20.4 10*3/uL — AB (ref 4.0–10.5)

## 2017-10-18 LAB — HEPARIN LEVEL (UNFRACTIONATED): HEPARIN UNFRACTIONATED: 0.64 [IU]/mL (ref 0.30–0.70)

## 2017-10-18 MED ORDER — ALTEPLASE 2 MG IJ SOLR
2.0000 mg | Freq: Once | INTRAMUSCULAR | Status: AC
  Administered 2017-10-18: 2 mg

## 2017-10-18 MED ORDER — ALTEPLASE 2 MG IJ SOLR
2.0000 mg | Freq: Once | INTRAMUSCULAR | Status: AC
  Administered 2017-10-18: 2 mg
  Filled 2017-10-18: qty 2

## 2017-10-18 MED ORDER — WARFARIN SODIUM 6 MG PO TABS
12.0000 mg | ORAL_TABLET | Freq: Once | ORAL | Status: AC
  Administered 2017-10-18: 12 mg via ORAL
  Filled 2017-10-18: qty 2

## 2017-10-18 MED ORDER — PRISMASOL BGK 4/2.5 32-4-2.5 MEQ/L IV SOLN
INTRAVENOUS | Status: DC
  Administered 2017-10-18 – 2017-10-20 (×16): via INTRAVENOUS_CENTRAL
  Filled 2017-10-18 (×35): qty 5000

## 2017-10-18 NOTE — Progress Notes (Signed)
Pharmacy Consult Note - Anticoagulation  Pharmacy Consult for:  Heparin / Coumadin (Overlap D#5) Indication: pulmonary embolus/Afib  Allergies  Allergen Reactions  . Bactrim [Sulfamethoxazole-Trimethoprim] Hives    Patient Measurements: Height: 5\' 9"  (175.3 cm) Weight: 236 lb 8.9 oz (107.3 kg) IBW/kg (Calculated) : 70.7 Heparin Dosing Weight: 98 kg  Vital Signs: Temp: 97.6 F (36.4 C) (03/10 0755) Temp Source: Oral (03/10 0755) BP: 129/70 (03/10 0900) Pulse Rate: 72 (03/10 0900)  Height: 5\' 9"  (175.3 cm) Weight: 264 lb 1.8 oz (119.8 kg) IBW/kg (Calculated) : 70.7  Temp (24hrs), Avg:98.9 F (37.2 C), Min:98.3 F (36.8 C), Max:100.6 F (38.1 C)  Recent Labs  Lab 10/10/17 0211  10/11/17 0434 10/12/17 0450 10/12/17 1543 10/13/17 0254 10/13/17 1504 10/13/17 2138 10/14/17 0316 10/14/17 0317  WBC 10.1  --  10.8* 13.4*  --  16.2*  --   --   --  25.1*  CREATININE 4.18*   < > 5.77* 6.02* 5.09* 4.13* 3.31* 3.11* 2.89*  --    < > = values in this interval not displayed.    Estimated Creatinine Clearance: 38.6 mL/min (A) (by C-G formula based on SCr of 2.89 mg/dL (H)).    Recent Labs    10/16/17 0412  10/17/17 0435 10/17/17 1502 10/18/17 0356  HGB 9.1*  --  8.9*  --  8.5*  HCT 29.9*  --  29.3*  --  28.3*  PLT 216  --  227  --  209  LABPROT 17.3*  --  20.2*  --  25.6*  INR 1.43  --  1.74  --  2.35  HEPARINUNFRC 0.45  --  0.57  --  0.64  CREATININE 2.09*   < > 1.91* 1.92* 1.90*   < > = values in this interval not displayed.    Estimated Creatinine Clearance: 55.5 mL/min (A) (by C-G formula based on SCr of 1.9 mg/dL (H)).    Assessment 51 YOM with Afib on Eliquis PTA and had a possible PE while on anticoagulation.  Pharmacy has been consulted to dose heparin bridge to Coumadin.  Of note patient is currently on CRRT and receiving some heparin through that modality.  Heparin level remains therapeutic on 2850 units/hr and INR is now therapeutic at 2.35. H/H is  stable and platelets are down slightly. No signs of bleeding have been noted.    Goal of Therapy:  Heparin level 0.3-0.7 units/ml INR 2 - 3  Monitor platelets by anticoagulation protocol: Yes   Plan Stop heparin with therapeutic INR and sufficient days of overlap Warfarin 12 mg X 1 tonight  Daily INR Monitor for signs/symptoms of bleeding   Sharin MonsEmily Jahyra Sukup, PharmD, BCPS PGY2 Infectious Diseases Pharmacy Resident Pager: [phone]  10/18/2017, 9:50 AM

## 2017-10-18 NOTE — Progress Notes (Signed)
[facility] Rounding Note  Background: 52 yo AAM HIV AF COPD MO Admitted w/resp failure, VDRF. Initially + Flu A, PNA; possible PE;  Baseline creatinine 0.79 10/01/17. AKI in setting of above issues. HDX1 3/4 for volume, markedly catabolic/recurrent [name], massively overloaded. CRRT initiated 10/12/17 for massive anasarca /hyperkalemia. R IJ temp cath 3/3 by CCM.   Assessment/Recommendations 1. AKI (acute kidney injury) - normal creatinine at baseline (0.79). Dense ATN. Markedly catabolic. CRRT started 3/4. Weight down 23 kg (if weights accurate). CVP 7. Keeping even past 24 hours - continue. Volume stable Back to zero K dialysate 3/9 2/2 K>5, today 3.3. Can try again to change dialysate back to 4K.  BP wise could do HD, but still afraid if tried to transition to IHD would not be able to manage K just yet.   2. Hyperkalemia - necessitated use of all 0 K fluids for a while, currently just dialysate. Tried to change to 4K dialysiate on 3/8 - K drifted up >5. K today 3.3 on 0K so try again transition 3. VDRF/COPD/ARDS/FluA+ - finished Tamiflu/ATB's; on steroids - weaning. Per CCM 4. HIV - meds; ID following 5. Esophageal candidiasis - noted during intubation. IV antifungals done  6. Fevers/leukocytosis. CCM/ID addressing. Vanco and zosyn for possible PNA 7. AF/RVR - amio/heparin/low dose dilt 8. A on cdCHF EF 60-65%  9. DM - per CCM 10. Encephalopathy - more responsive, sedation ^ d/t agitation. Per nursing follows some commands.  Jamal Maes, MD [facility] [phone] pager 10/18/2017, 7:56 AM  Subjective/Interval History:  HD 3/3; CRRT 3/4  2/2 hyperkalemia, marked azotemia, high O2 requirement 500/500/2000 4/4/0 K (3/7) Effluent dose 20 ml/kg/hour Temp cath 3/3 R IJ Current fluid removal 0-100  Changed dialysate to 4K 3/8, K drifted up >5, back 0K dialysate 3/9 Pre/post 4K  Objective Vital signs in last 24 hours: Vitals:   10/18/17 0500 10/18/17 0600 10/18/17 0700 10/18/17 0736   BP: 122/74 121/71 138/72 135/71  Pulse: 64 62 64 63  Resp: _0 Temp:      TempSrc:      SpO2: 97% 97% 94%   Weight:      Height:       Weight change: -0.4 kg (-14.1 oz)  Intake/Output Summary (Last 24 hours) at 10/18/2017 0756 Last data filed at 10/18/2017 0741 Gross per 24 hour  Intake 3452.8 ml  Output 3443 ml  Net 9.8 ml   Physical Exam:  Blood pressure 135/71, pulse 63, temperature 97.7 F (36.5 C), temperature source Oral, resp. rate 20, height _1  (1.753 m), weight 107.3 kg (236 lb 8.9 oz), SpO2 94 %.   Looks best I have seen, follows commands, grips, wiggles toes, tries to move legs and responses appropriate R IJ temp HD cath (3/3) CVP 7 Trach/vent/scd's (foley out, bladder scan sludge) Ant clear Tachy S1S2 NoS3 Obese abdomen, + BS still tight SCD's in place Wrinkling skin of feet Pitting edema LE's resolved  Weight trending         10/18/17 107.3 kg   10/17/17 107.7 kg Changed to keep even  10/16/17 110.4 kg   10/15/17  114 kg   10/15/15 119.8 k   10/13/17 122.5 kg   10/12/17  130.8 kg  CRRT started  10/11/17  127.6 kg    10/10/17  126.1      Recent Labs  Lab 10/15/17 0341 10/15/17 1525 10/16/17 0412 10/16/17 1359 10/17/17 0435 10/17/17 1502 10/18/17 0356  NA 134* 132* 133* 135  134* 132* 135  K 5.2* 4.6 3.8 5.1 3.6 3.6 3.3*  CL 97* 95* 95* 97* 96* 95* 98*  CO2 _0 GLUCOSE 177* 201* 186* 164* 156* 208* 131*  BUN 66* 60* 57* 56* 52* 51* 46*  CREATININE 2.37* 2.24* 2.09* 2.03* 1.91* 1.92* 1.90*  CALCIUM 8.4* 8.4* 8.7* 8.9 8.8* 8.4* 8.7*  PHOS 7.2* 6.8* 6.2* 7.2* 5.5* 5.2* 5.1*    Recent Labs  Lab 10/17/17 0435 10/17/17 1502 10/18/17 0356  ALBUMIN 2.2* 2.0* 2.0*    Recent Labs  Lab 10/12/17 0450 10/13/17 0254  10/15/17 0353 10/16/17 0412 10/17/17 0435 10/18/17 0356  WBC 13.4* 16.2*   < > 24.4* 29.8* 30.2* 20.4*  NEUTROABS 12.4* 15.6*  --   --   --   --   --   HGB 9.2* 8.5*   < > 8.8* 9.1* 8.9* 8.5*  HCT 29.6*  27.0*   < > 28.7* 29.9* 29.3* 28.3*  MCV 91.4 90.6   < > 94.4 94.6 94.2 95.9  PLT 207 213   < > 219 216 227 209   < > = values in this interval not displayed.     Recent Labs  Lab 10/17/17 1222 10/17/17 1608 10/17/17 1930 10/17/17 2336 10/18/17 0358  GLUCAP 185* 239* 210* 167* 127*    Studies/Results: Dg Chest Portable 1 View  Result Date: 10/16/2017 CLINICAL DATA:  Respiratory failure EXAM: PORTABLE CHEST 1 VIEW COMPARISON:  10/14/2017 FINDINGS: Tracheostomy in good position. Bilateral central venous catheter tips in the SVC unchanged. Feeding tube in place with the tip not visualized. Cardiac enlargement with mild vascular congestion which has improved in the interval. Improvement in bibasilar airspace disease. No pleural effusion IMPRESSION: Improvement in bilateral airspace disease. Support lines remain in good position. Electronically Signed   By: Franchot Gallo M.D.   On: 10/16/2017   Dg Chest Port 1 View  Result Date: 10/17/2017 CLINICAL DATA:  Respiratory failure. EXAM: PORTABLE CHEST 1 VIEW COMPARISON:  10/16/2017 FINDINGS: Tracheostomy tube overlies the airway. Bilateral jugular catheters terminate over the upper SVC. Feeding tube courses into the left upper abdomen with tip not imaged. The cardiac silhouette remains enlarged. Mild diffuse prominence of the interstitial markings is unchanged and may be at least in part chronic and secondary to known emphysema. Mild patchy basilar opacities are minimally increased on the right and unchanged on the left. No overt edema, sizable pleural effusion, or pneumothorax is identified. IMPRESSION: Mild bibasilar opacities, minimally increased on the right and which may reflect atelectasis. Electronically Signed   By: Logan Bores M.D.   On: 10/17/2017 07:41   Medications: . sodium chloride 500 mL (10/14/17 1442)  . sodium chloride 100 mL (10/18/17 0500)  . sodium chloride    . dexmedetomidine (PRECEDEX) IV infusion 0.4 mcg/kg/hr (10/18/17  0741)  . fentaNYL infusion INTRAVENOUS 100 mcg/hr (10/18/17 0741)  . heparin 2,850 Units/hr (10/18/17 0700)  . heparin    . piperacillin-tazobactam Stopped (10/18/17 0240)  . dialysate (PRISMASATE) 2,000 mL/hr at 10/18/17 0610  . dialysis replacement fluid (prismasate) 500 mL/hr at 10/17/17 2114  . dialysis replacement fluid (prismasate) 500 mL/hr at 10/17/17 2114   Medications . amiodarone  200 mg Per Tube BID   Followed by  . amiodarone  200 mg Per Tube Daily  . chlorhexidine gluconate (MEDLINE KIT)  15 mL Mouth Rinse BID  . Chlorhexidine Gluconate Cloth  6 each Topical Daily  . clonazePAM  0.5 mg Per Tube  QHS  . diltiazem  60 mg Per Tube Q6H  . docusate  100 mg Per Tube BID  . dolutegravir  50 mg Oral Daily  . emtricitabine  200 mg Oral Once every 4 days  . famotidine  20 mg Per Tube QHS  . feeding supplement (VITAL HIGH PROTEIN)  1,000 mL Per Tube Q24H  . insulin aspart  0-20 Units Subcutaneous Q4H  . insulin aspart  5 Units Subcutaneous Q4H  . insulin glargine  35 Units Subcutaneous BID  . ipratropium-albuterol  3 mL Nebulization Q6H  . mouth rinse  15 mL Mouth Rinse 10 times per day  . polyethylene glycol  17 g Oral Daily  . predniSONE  10 mg Per Tube Q breakfast  . sennosides  5 mL Per Tube QHS  . sodium chloride flush  10-40 mL Intracatheter Q12H  . sodium chloride flush  3 mL Intravenous Q12H  . tenofovir  300 mg Oral Q Wed  . Warfarin - Pharmacist Dosing Inpatient   Does not apply [phone]

## 2017-10-18 NOTE — Progress Notes (Signed)
Patient continued with decreased arterial pressures lines switched and flushed without success. Blood returned and IV team consulted.

## 2017-10-18 NOTE — Progress Notes (Addendum)
HD catheter continues with low access pressure lines switched and flushed

## 2017-10-18 NOTE — Progress Notes (Signed)
Updated Dr. Craige CottaSood regarding GOC mtg with wife and family 10/17/17. Family is requesting a multidisciplinary mtg between Palliative, CCM, and nephrology. Will see if a member of our team can arrange. Pt' s wife states she  flexible to meet. They are also requesting transfer to [facility] Thank you, Eduard RouxSarah Shantell Belongia, ANP-ACHPN

## 2017-10-18 NOTE — Progress Notes (Signed)
    [facility]   Reason for visit: Follow up on HIV, fever  Interval History:  Afebrile and WBC down to 20; no acute events.   Day 5 pip-tazo  Physical Exam: Constitutional:  Vitals:   10/18/17 1110 10/18/17 1140  BP: (!) 196/81   Pulse:    Resp: (!) 22   Temp:  98.1 F (36.7 C)  SpO2:     patient is more responsive today, tracks, nods yes, no Eyes: anicteric, open HENT: + trach Respiratory: respiratory effort on vent; CTA B Cardiovascular: RRR, + SEM GI: soft, + distended,   Review of Systems: Unable to be assessed due to mental status  Lab Results  Component Value Date   WBC 20.4 (H) 10/18/2017   HGB 8.5 (L) 10/18/2017   HCT 28.3 (L) 10/18/2017   MCV 95.9 10/18/2017   PLT 209 10/18/2017    Lab Results  Component Value Date   CREATININE 1.90 (H) 10/18/2017   BUN 46 (H) 10/18/2017   NA 135 10/18/2017   K 3.3 (L) 10/18/2017   CL 98 (L) 10/18/2017   CO2 25 10/18/2017    Lab Results  Component Value Date   ALT 18 10/02/2017   AST 23 10/02/2017   ALKPHOS 44 10/02/2017     Microbiology: Recent Results (from the past 240 hour(s))  Culture, respiratory (NON-Expectorated)     Status: None   Collection Time: 10/13/17 10:06 AM  Result Value Ref Range Status   Specimen Description TRACHEAL ASPIRATE  Final   Special Requests NONE  Final   Gram Stain   Final    RARE WBC PRESENT, PREDOMINANTLY PMN RARE GRAM POSITIVE COCCI RARE GRAM VARIABLE ROD    Culture   Final    Consistent with normal respiratory flora. Performed at [facility], [address] [place], [place] [address]    Report Status 10/16/2017 FINAL  Final  Culture, blood (Routine X 2) w Reflex to ID Panel     Status: None (Preliminary result)   Collection Time: 10/14/17  9:18 AM  Result Value Ref Range Status   Specimen Description BLOOD WRIST LEFT  Final   Special Requests   Final    BOTTLES DRAWN AEROBIC ONLY Blood Culture adequate volume   Culture   Final   NO GROWTH 3 DAYS Performed at [facility], [address] [place], [place] [address]    Report Status PENDING  Incomplete  Culture, blood (Routine X 2) w Reflex to ID Panel     Status: None (Preliminary result)   Collection Time: 10/14/17  9:19 AM  Result Value Ref Range Status   Specimen Description BLOOD LEFT HAND  Final   Special Requests IN PEDIATRIC BOTTLE Blood Culture adequate volume  Final   Culture   Final    NO GROWTH 3 DAYS Performed at [facility], [address] [place], [place] [address]    Report Status PENDING  Incomplete    Impression/Plan:  1. HIV -continues on renally dosed regimen and no changes.    2.  Fever - improved and continue pip-tazo for 7 days. Likely pneumonia.   3.  Renal insufficiency - continues on CRRT.    4.  Encephalopathy - improved alertness this am.

## 2017-10-18 NOTE — Progress Notes (Signed)
PULMONARY / CRITICAL CARE MEDICINE   Name: Chris RiversJerome A Osorno Jr. MRN: [id number] DOB: 05/11/1966    ADMISSION DATE:  10/01/2017 CONSULTATION DATE:  2/21  REFERRING MD:  Juleen ChinaKohut  CHIEF COMPLAINT:  SOB  HISTORY OF PRESENT ILLNESS:   52 yo male presented with dyspnea, cough, fever, hypoxia.  Found to have Influenza PNA causing ARDS.  Course complicated by hypervolemia and renal failure.  PMHx of HIV, A fib, COPD, DM.   SUBJECTIVE:  More alert.  Tolerating pressure support better.  Remains on CRRT.  VITAL SIGNS: BP (!) 196/81   Pulse 70   Temp 98.1 F (36.7 C) (Oral)   Resp (!) 22   Ht 5\' 9"  (1.753 m)   Wt 236 lb 8.9 oz (107.3 kg)   SpO2 94%   BMI 34.93 kg/m   HEMODYNAMICS: CVP:  [7 mmHg] 7 mmHg  VENTILATOR SETTINGS: Vent Mode: PCV FiO2 (%):  [50 %-60 %] 50 % Set Rate:  [16 bmp] 16 bmp PEEP:  [5 cmH20] 5 cmH20 Pressure Support:  [8 cmH20] 8 cmH20 Plateau Pressure:  [14 cmH20-24 cmH20] 18 cmH20  INTAKE / OUTPUT: I/O last 3 completed shifts: In: 4968.7 [I.V.:2263.7; NG/GT:2455; IV Piggyback:250] Out: 5618 [Other:5618]  PHYSICAL EXAMINATION:  General - pleasant Eyes - pupils reactive ENT - trach site clean Cardiac - regular, no murmur Chest - no wheeze, rales Abd - soft, non tender Ext - no edema Skin - no rashes Neuro - normal strength   LABS:  BMET Recent Labs  Lab 10/17/17 0435 10/17/17 1502 10/18/17 0356  NA 134* 132* 135  K 3.6 3.6 3.3*  CL 96* 95* 98*  CO2 24 23 25   BUN 52* 51* 46*  CREATININE 1.91* 1.92* 1.90*  GLUCOSE 156* 208* 131*   Electrolytes Recent Labs  Lab 10/12/17 0450  10/13/17 0254  10/14/17 0317  10/17/17 0435 10/17/17 1502 10/18/17 0356  CALCIUM 8.1*   < > 8.5*   < >  --    < > 8.8* 8.4* 8.7*  MG 3.1*  --  3.0*  --  3.0*  --   --   --   --   PHOS 9.7*   < > 7.2*   < > 6.5*   < > 5.5* 5.2* 5.1*   < > = values in this interval not displayed.   CBC Recent Labs  Lab 10/16/17 0412 10/17/17 0435 10/18/17 0356  WBC 29.8*  30.2* 20.4*  HGB 9.1* 8.9* 8.5*  HCT 29.9* 29.3* 28.3*  PLT 216 227 209   Coag's Recent Labs  Lab 10/16/17 0412 10/17/17 0435 10/18/17 0356  INR 1.43 1.74 2.35    Sepsis Markers Recent Labs  Lab 10/14/17 0918 10/15/17 0353 10/16/17 0412  PROCALCITON 11.32 6.91 8.65    ABG Recent Labs  Lab 10/11/17 2331 10/13/17 0550 10/13/17 0950  PHART 7.309* 7.261* 7.315*  PCO2ART 48.6* 57.9* 51.7*  PO2ART 68.0* 194* 78.3*    Liver Enzymes Recent Labs  Lab 10/17/17 0435 10/17/17 1502 10/18/17 0356  ALBUMIN 2.2* 2.0* 2.0*    Cardiac Enzymes No results for input(s): TROPONINI, PROBNP in the last 168 hours.  Glucose Recent Labs  Lab 10/17/17 1608 10/17/17 1930 10/17/17 2336 10/18/17 0358 10/18/17 0759 10/18/17 1141  GLUCAP 239* 210* 167* 127* 127* 128*    Imaging No results found.  STUDIES:  Echo 2/25 >> EF 60 to 65%, mild MR Renal u/s 2/28 >> slight fullness of renal collecting system  CULTURES: Respiratory viral panel 2/21 >> Influenza  A H3 Blood 2/21 >> negative Sputum 2/22 >> oral flora Urine 2/26 >> negative Sputum 2/26 >> oral flora Blood 2/27 >> negative Sputum 3/5>>> oral flora Blood 3/6>>>  ANTIBIOTICS: Vancomycin 2/21 >>2/22 Cefepime 2/21 >> 2/28 Tamiflu 2/21 >> 2/26 Fluconazole 2/21 >> 2/27 Anidulafungin 2/27 >>3/6 Zithromax 2/27 >> 3/1  SIGNIFICANT EVENTS: 2/21 Admit 2/22 VDRF 2/27 ID consulted for persistent fever 2/28 Renal consult for AKI, Cardiology consult for A fib with RVR 3/03 Attempted HD 3/04 Changed to CRRT 3/05 Worsening hypoxia >> vent settings adjusted  LINES/TUBES: ETT 2/22 >> 3/1 Left IJ 2/27 >>> Trach 3/01 >>> R IJ HD cath 3/03 >>>  DISCUSSION: Mental status improved after tx for respiratory infection.  Tolerating pressure support more.  Still stuck on CRRT, and this will likely be limiting factor as we progress.  ASSESSMENT / PLAN:  Acute hypoxic/hypercapnic respiratory failure from Influenza PNA. Hx  of COPD with emphysema. Purulent tracheobronchitis. - pressure support wean as able - pressure control for rest mode - wean off prednisone - scheduled BDs  A fib with RVR. Acute on chronic diastolic CHF with hx of HTN. - continue amiodarone, cardizem, coumadin  Acute renal failure 2nd to ATN. Hyperkalemia. - CRRT per renal  Anemia of critical illness and chronic disease. - f/u CBC  Purulent tracheobronchitis. Esophageal candidiasis. Hx of HIV. - ABx per ID - continue emtriva, tivicay, tenofivir  DM type II. - SSI with lantus  Acute metabolic encephalopathy >> improved 3/09. - klonopin to 0.5 mg qhs - RASS goal 0  - precedex, fentanyl gtt  DVT prophylaxis - heparin gtt with transition to coumadin SUP - pepcid Nutrition - tube feeds Goals of care - full code.  Palliative care consulted.  Coralyn Helling, MD [facility] Pulmonary/Critical Care 10/18/2017, 12:09 PM Pager:  [phone] After 3pm call: [phone]

## 2017-10-19 ENCOUNTER — Inpatient Hospital Stay (HOSPITAL_COMMUNITY): Payer: Medicare HMO

## 2017-10-19 LAB — CBC
HCT: 27.2 % — ABNORMAL LOW (ref 39.0–52.0)
Hemoglobin: 8.2 g/dL — ABNORMAL LOW (ref 13.0–17.0)
MCH: 28.5 pg (ref 26.0–34.0)
MCHC: 30.1 g/dL (ref 30.0–36.0)
MCV: 94.4 fL (ref 78.0–100.0)
PLATELETS: 213 10*3/uL (ref 150–400)
RBC: 2.88 MIL/uL — ABNORMAL LOW (ref 4.22–5.81)
RDW: 15.7 % — AB (ref 11.5–15.5)
WBC: 16.8 10*3/uL — AB (ref 4.0–10.5)

## 2017-10-19 LAB — BLOOD GAS, ARTERIAL
Acid-Base Excess: 1.2 mmol/L (ref 0.0–2.0)
Bicarbonate: 25.9 mmol/L (ref 20.0–28.0)
DRAWN BY: 511911
FIO2: 40
LHR: 16 {breaths}/min
O2 Saturation: 93.1 %
PATIENT TEMPERATURE: 97.7
PCO2 ART: 44.4 mmHg (ref 32.0–48.0)
PEEP: 5 cmH2O
PO2 ART: 67.3 mmHg — AB (ref 83.0–108.0)
Pressure control: 20 cmH2O
pH, Arterial: 7.38 (ref 7.350–7.450)

## 2017-10-19 LAB — GLUCOSE, CAPILLARY
GLUCOSE-CAPILLARY: 107 mg/dL — AB (ref 65–99)
GLUCOSE-CAPILLARY: 115 mg/dL — AB (ref 65–99)
GLUCOSE-CAPILLARY: 116 mg/dL — AB (ref 65–99)
Glucose-Capillary: 127 mg/dL — ABNORMAL HIGH (ref 65–99)
Glucose-Capillary: 189 mg/dL — ABNORMAL HIGH (ref 65–99)
Glucose-Capillary: 153 mg/dL — ABNORMAL HIGH (ref 65–99)

## 2017-10-19 LAB — RENAL FUNCTION PANEL
ALBUMIN: 1.9 g/dL — AB (ref 3.5–5.0)
ALBUMIN: 1.9 g/dL — AB (ref 3.5–5.0)
ANION GAP: 9 (ref 5–15)
Anion gap: 12 (ref 5–15)
BUN: 50 mg/dL — ABNORMAL HIGH (ref 6–20)
BUN: 44 mg/dL — ABNORMAL HIGH (ref 6–20)
CALCIUM: 8.5 mg/dL — AB (ref 8.9–10.3)
CALCIUM: 8 mg/dL — AB (ref 8.9–10.3)
CO2: 24 mmol/L (ref 22–32)
CO2: 24 mmol/L (ref 22–32)
Chloride: 97 mmol/L — ABNORMAL LOW (ref 101–111)
Chloride: 101 mmol/L (ref 101–111)
Creatinine, Ser: 2.01 mg/dL — ABNORMAL HIGH (ref 0.61–1.24)
Creatinine, Ser: 1.78 mg/dL — ABNORMAL HIGH (ref 0.61–1.24)
GFR calc Af Amer: 49 mL/min — ABNORMAL LOW (ref >60)
GFR calc non Af Amer: 37 mL/min — ABNORMAL LOW (ref >60)
GFR, EST AFRICAN AMERICAN: 43 mL/min — AB (ref >60)
GFR, EST NON AFRICAN AMERICAN: 42 mL/min — AB (ref >60)
Glucose, Bld: 124 mg/dL — ABNORMAL HIGH (ref 65–99)
Glucose, Bld: 137 mg/dL — ABNORMAL HIGH (ref 65–99)
PHOSPHORUS: 4.7 mg/dL — AB (ref 2.5–4.6)
PHOSPHORUS: 4.2 mg/dL (ref 2.5–4.6)
POTASSIUM: 4.2 mmol/L (ref 3.5–5.1)
Potassium: 3.9 mmol/L (ref 3.5–5.1)
SODIUM: 133 mmol/L — AB (ref 135–145)
Sodium: 134 mmol/L — ABNORMAL LOW (ref 135–145)

## 2017-10-19 LAB — CULTURE, BLOOD (ROUTINE X 2)
CULTURE: NO GROWTH
Culture: NO GROWTH
SPECIAL REQUESTS: ADEQUATE
Special Requests: ADEQUATE

## 2017-10-19 LAB — PROTIME-INR
INR: 2.76
Prothrombin Time: 28.9 seconds — ABNORMAL HIGH (ref 11.4–15.2)

## 2017-10-19 MED ORDER — DARBEPOETIN ALFA 100 MCG/0.5ML IJ SOSY
100.0000 ug | PREFILLED_SYRINGE | Freq: Once | INTRAMUSCULAR | Status: AC
  Administered 2017-10-19: 100 ug via INTRAVENOUS
  Filled 2017-10-19 (×2): qty 0.5

## 2017-10-19 MED ORDER — WARFARIN SODIUM 10 MG PO TABS
10.0000 mg | ORAL_TABLET | Freq: Once | ORAL | Status: AC
  Administered 2017-10-19: 10 mg via ORAL
  Filled 2017-10-19: qty 1

## 2017-10-19 MED ORDER — ORAL CARE MOUTH RINSE
15.0000 mL | Freq: Four times a day (QID) | OROMUCOSAL | Status: DC
  Administered 2017-10-19 – 2017-10-30 (×34): 15 mL via OROMUCOSAL

## 2017-10-19 NOTE — Progress Notes (Signed)
[facility] Rounding Note  Background: 52 yo AAM HIV AF COPD MO Admitted w/resp failure, VDRF. + Flu A/PNA; possible PE;  Baseline creatinine 0.79 10/01/17. AKI in setting of above issues. HDX1 3/4 for volume, markedly catabolic/recurrent [name], massively overloaded. CRRT initiated 10/12/17 for massive anasarca /hyperkalemia. R IJ temp cath 3/3 by CCM.   Assessment/Recommendations 1. AKI (acute kidney injury) - normal creatinine at baseline (0.79). Dense ATN. Markedly catabolic. CRRT started 3/4. Weight down 23 kg (if weights accurate). CVP 8. Keeping even now - continue. Volume stable - no UOP.  However would still be optimistic for eventual renal recovery - if cont to have cath issues and no UOP will consider change out to [facility].  If K stays stable next 24 hours attempt transition to IHD  2. Hyperkalemia - necessitated use of all 0 K fluids for a while, currently just 4 K dialysate. VDRF/COPD/ARDS/FluA+ - finished Tamiflu/ATB's; on steroids - weaning. Per CCM 3. HIV - meds; ID following 4. Esophageal candidiasis - noted during intubation. IV antifungals done  5. Fevers/leukocytosis. CCM/ID addressing. Vanco and zosyn for possible PNA 6. AF/RVR - amio/heparin/low dose dilt 7. A on cdCHF EF 60-65%  8. DM - per CCM 9. Encephalopathy - more responsive, sedation ^ d/t agitation. Per nursing follows some commands 10. Anemia- add ESA- give darbe 100 today.  Chris [name] A 10/18/2017, 7:56 AM  Subjective/Interval History:  HD started 3/3; CRRT 3/4  2/2 hyperkalemia, marked azotemia, high O2 requirement 500/500/2000 4/4/4 K (3/10) Temp cath 3/3 R IJ Current fluid removal 0-100 Catheter seems positional- running at 100 right now   Objective Vital signs in last 24 hours: Vitals:   10/19/17 0400 10/19/17 0500 10/19/17 0600 10/19/17 0700  BP: 110/66 123/71 109/65 (!) 94/56  Pulse: 66 70 66 (!) 58  Resp: _0 Temp: 98.7 F (37.1 C)     TempSrc: Oral     SpO2: 95% 96% 96% 96%  Weight: 106.9 kg  (235 lb 10.8 oz)     Height:       Weight change: -0.4 kg (-14.1 oz)  Intake/Output Summary (Last 24 hours) at 10/19/2017 0734 Last data filed at 10/19/2017 0700 Gross per 24 hour  Intake 2537.05 ml  Output 2371 ml  Net 166.05 ml   Physical Exam:  Blood pressure (!) 94/56, pulse (!) 58, temperature 98.7 F (37.1 C), temperature source Oral, resp. rate 18, height _1  (1.753 m), weight 106.9 kg (235 lb 10.8 oz), SpO2 96 %.   Looks best I have seen, follows commands, grips, wiggles toes, tries to move legs and responses appropriate R IJ temp HD cath (3/3) CVP 7 Trach/vent/scd's (foley out, bladder scan sludge) Ant clear Tachy S1S2 NoS3 Obese abdomen, + BS still tight SCD's in place Wrinkling skin of feet Pitting edema LE's resolved  Weight trending         10/18/17 107.3 kg   10/17/17 107.7 kg Changed to keep even  10/16/17 110.4 kg   10/15/17  114 kg   10/15/15 119.8 k   10/13/17 122.5 kg   10/12/17  130.8 kg  CRRT started  10/11/17  127.6 kg    10/10/17  126.1      Recent Labs  Lab 10/16/17 0412 10/16/17 1359 10/17/17 0435 10/17/17 1502 10/18/17 0356 10/18/17 1600 10/19/17 0402  NA 133* 135 134* 132* 135 132* 133*  K 3.8 5.1 3.6 3.6 3.3* 3.5 3.9  CL 95* 97* 96* 95* 98* 96* 97*  CO2 23  _0 GLUCOSE 186* 164* 156* 208* 131* 104* 124*  BUN 57* 56* 52* 51* 46* 30* 50*  CREATININE 2.09* 2.03* 1.91* 1.92* 1.90* 1.25* 2.01*  CALCIUM 8.7* 8.9 8.8* 8.4* 8.7* 8.6* 8.5*  PHOS 6.2* 7.2* 5.5* 5.2* 5.1* 3.1 4.7*    Recent Labs  Lab 10/18/17 0356 10/18/17 1600 10/19/17 0402  ALBUMIN 2.0* 2.2* 1.9*    Recent Labs  Lab 10/13/17 0254  10/16/17 0412 10/17/17 0435 10/18/17 0356 10/19/17 0402  WBC 16.2*   < > 29.8* 30.2* 20.4* 16.8*  NEUTROABS 15.6*  --   --   --   --   --   HGB 8.5*   < > 9.1* 8.9* 8.5* 8.2*  HCT 27.0*   < > 29.9* 29.3* 28.3* 27.2*  MCV 90.6   < > 94.6 94.2 95.9 94.4  PLT 213   < > 216 227 209 213   < > = values in this interval not  displayed.     Recent Labs  Lab 10/18/17 1141 10/18/17 1603 10/18/17 2000 10/18/17 2338 10/19/17 0416  GLUCAP 128* 95 173* 112* 127*    Studies/Results: Dg Chest Portable 1 View  Result Date: 10/16/2017 CLINICAL DATA:  Respiratory failure EXAM: PORTABLE CHEST 1 VIEW COMPARISON:  10/14/2017 FINDINGS: Tracheostomy in good position. Bilateral central venous catheter tips in the SVC unchanged. Feeding tube in place with the tip not visualized. Cardiac enlargement with mild vascular congestion which has improved in the interval. Improvement in bibasilar airspace disease. No pleural effusion IMPRESSION: Improvement in bilateral airspace disease. Support lines remain in good position. Electronically Signed   By: Franchot Gallo M.D.   On: 10/16/2017   Dg Chest Port 1 View  Result Date: 10/17/2017 CLINICAL DATA:  Respiratory failure. EXAM: PORTABLE CHEST 1 VIEW COMPARISON:  10/16/2017 FINDINGS: Tracheostomy tube overlies the airway. Bilateral jugular catheters terminate over the upper SVC. Feeding tube courses into the left upper abdomen with tip not imaged. The cardiac silhouette remains enlarged. Mild diffuse prominence of the interstitial markings is unchanged and may be at least in part chronic and secondary to known emphysema. Mild patchy basilar opacities are minimally increased on the right and unchanged on the left. No overt edema, sizable pleural effusion, or pneumothorax is identified. IMPRESSION: Mild bibasilar opacities, minimally increased on the right and which may reflect atelectasis. Electronically Signed   By: Logan Bores M.D.   On: 10/17/2017 07:41   Medications: . sodium chloride 250 mL (10/18/17 1800)  . sodium chloride 100 mL (10/18/17 1800)  . sodium chloride    . dexmedetomidine (PRECEDEX) IV infusion 0.8 mcg/kg/hr (10/19/17 0732)  . fentaNYL infusion INTRAVENOUS 150 mcg/hr (10/19/17 0700)  . heparin    . piperacillin-tazobactam Stopped (10/19/17 0370)  . dialysis  replacement fluid (prismasate) 500 mL/hr at 10/18/17 2353  . dialysis replacement fluid (prismasate) 500 mL/hr at 10/19/17 0124  . dialysate (PRISMASATE) 2,000 mL/hr at 10/19/17 4888   Medications . amiodarone  200 mg Per Tube Daily  . chlorhexidine gluconate (MEDLINE KIT)  15 mL Mouth Rinse BID  . Chlorhexidine Gluconate Cloth  6 each Topical Daily  . clonazePAM  0.5 mg Per Tube QHS  . diltiazem  60 mg Per Tube Q6H  . docusate  100 mg Per Tube BID  . dolutegravir  50 mg Oral Daily  . emtricitabine  200 mg Oral Once every 4 days  . famotidine  20 mg Per Tube QHS  . feeding supplement (VITAL  HIGH PROTEIN)  1,000 mL Per Tube Q24H  . insulin aspart  0-20 Units Subcutaneous Q4H  . insulin aspart  5 Units Subcutaneous Q4H  . insulin glargine  35 Units Subcutaneous BID  . ipratropium-albuterol  3 mL Nebulization Q6H  . polyethylene glycol  17 g Oral Daily  . predniSONE  10 mg Per Tube Q breakfast  . sennosides  5 mL Per Tube QHS  . sodium chloride flush  10-40 mL Intracatheter Q12H  . sodium chloride flush  3 mL Intravenous Q12H  . tenofovir  300 mg Oral Q Wed  . warfarin  10 mg Oral ONCE-1800  . Warfarin - Pharmacist Dosing Inpatient   Does not apply [phone]

## 2017-10-19 NOTE — Plan of Care (Signed)
  Progressing Education: Knowledge of General Education information will improve 10/19/2017 2049 - Progressing by Alen Bleacheriaz, Wilmon Conover L, RN Health Behavior/Discharge Planning: Ability to manage health-related needs will improve 10/19/2017 2049 - Progressing by Alen Bleacheriaz, Demika Langenderfer L, RN Clinical Measurements: Ability to maintain clinical measurements within normal limits will improve 10/19/2017 2049 - Progressing by Alen Bleacheriaz, Laney Bagshaw L, RN Will remain free from infection 10/19/2017 2049 - Progressing by Alen Bleacheriaz, Brooks Kinnan L, RN Diagnostic test results will improve 10/19/2017 2049 - Progressing by Alen Bleacheriaz, Vinnie Bobst L, RN Respiratory complications will improve 10/19/2017 2049 - Progressing by Alen Bleacheriaz, Caedyn Tassinari L, RN Cardiovascular complication will be avoided 10/19/2017 2049 - Progressing by Alen Bleacheriaz, Kedra Mcglade L, RN Activity: Risk for activity intolerance will decrease 10/19/2017 2049 - Progressing by Alen Bleacheriaz, Joydan Gretzinger L, RN Nutrition: Adequate nutrition will be maintained 10/19/2017 2049 - Progressing by Alen Bleacheriaz, Weber Monnier L, RN Elimination: Will not experience complications related to bowel motility 10/19/2017 2049 - Progressing by Alen Bleacheriaz, Yael Angerer L, RN Will not experience complications related to urinary retention 10/19/2017 2049 - Progressing by Alen Bleacheriaz, Freda Jaquith L, RN Pain Managment: General experience of comfort will improve 10/19/2017 2049 - Progressing by Alen Bleacheriaz, Danai Gotto L, RN Safety: Ability to remain free from injury will improve 10/19/2017 2049 - Progressing by Alen Bleacheriaz, Caitlyne Ingham L, RN Activity: Ability to tolerate increased activity will improve 10/19/2017 2049 - Progressing by Alen Bleacheriaz, Zali Kamaka L, RN Respiratory: Ability to maintain a clear airway and adequate ventilation will improve 10/19/2017 2049 - Progressing by Alen Bleacheriaz, Asna Muldrow L, RN Role Relationship: Method of communication will improve 10/19/2017 2049 - Progressing by Alen Bleacheriaz, Steffi Noviello L, RN Education: Knowledge about tracheostomy care/management will improve 10/19/2017 2049 - Progressing by Alen Bleacheriaz, Maisey Deandrade L, RN Activity: Ability to  tolerate increased activity will improve 10/19/2017 2049 - Progressing by Alen Bleacheriaz, Montoya Watkin L, RN Health Behavior/Discharge Planning: Ability to manage tracheostomy will improve 10/19/2017 2049 - Progressing by Alen Bleacheriaz, Consuela Widener L, RN Respiratory: Patent airway maintenance will improve 10/19/2017 2049 - Progressing by Alen Bleacheriaz, Raysa Bosak L, RN Role Relationship: Ability to communicate will improve 10/19/2017 2049 - Progressing by Alen Bleacheriaz, Heliodoro Domagalski L, RN Fluid Volume: Fluid volume balance will be maintained or improved 10/19/2017 2049 - Progressing by Alen Bleacheriaz, Tannor Pyon L, RN Respiratory: Respiratory symptoms related to disease process will be avoided 10/19/2017 2049 - Progressing by Alen Bleacheriaz, Siniyah Evangelist L, RN Urinary Elimination: Progression of disease will be identified and treated 10/19/2017 2049 - Progressing by Alen Bleacheriaz, Shamila Lerch L, RN

## 2017-10-19 NOTE — Progress Notes (Signed)
eLink Physician-Brief Progress Note Patient Name: Chris [name]. DOB: 07/26/1966 MRN: [id number]   Date of Service  10/19/2017  HPI/Events of Note  Repiratory Therapy concerned that patient is taking 1 liter TV on 40%/PC 20/Rate 16/P 5. Video assessment reveals that patient is intermittently stacking breaths.   eICU Interventions  Will order: 1. Increase sedation until patient tolerates ventilator settings without stacking.  2. ABG at 10 PM.      Intervention Category Major Interventions: Respiratory failure - evaluation and management  Lenell AntuSommer,Steven Eugene 10/19/2017, 8:47 PM

## 2017-10-19 NOTE — Progress Notes (Signed)
    [facility]   Reason for visit: Follow up on HIV, fever  Interval History:  Afebrile and WBC down to 16.8; no acute events.   Day 6/7 pip-tazo  Physical Exam: Constitutional:  Vitals:   10/19/17 0900 10/19/17 1000  BP: 123/70 135/74  Pulse: 69 73  Resp: 17 19  Temp:    SpO2: 98% 97%   patient is more responsive today, nods appropriately Eyes: anicteric, open HENT: + trach Respiratory: respiratory effort on vent; CTA B Cardiovascular: RRR, + SEM GI: soft, + distended,   Review of Systems: Constitutional: denies any chills MS: no pain  Lab Results  Component Value Date   WBC 16.8 (H) 10/19/2017   HGB 8.2 (L) 10/19/2017   HCT 27.2 (L) 10/19/2017   MCV 94.4 10/19/2017   PLT 213 10/19/2017    Lab Results  Component Value Date   CREATININE 2.01 (H) 10/19/2017   BUN 50 (H) 10/19/2017   NA 133 (L) 10/19/2017   K 3.9 10/19/2017   CL 97 (L) 10/19/2017   CO2 24 10/19/2017    Lab Results  Component Value Date   ALT 18 10/02/2017   AST 23 10/02/2017   ALKPHOS 44 10/02/2017     Microbiology: Recent Results (from the past 240 hour(s))  Culture, respiratory (NON-Expectorated)     Status: None   Collection Time: 10/13/17 10:06 AM  Result Value Ref Range Status   Specimen Description TRACHEAL ASPIRATE  Final   Special Requests NONE  Final   Gram Stain   Final    RARE WBC PRESENT, PREDOMINANTLY PMN RARE GRAM POSITIVE COCCI RARE GRAM VARIABLE ROD    Culture   Final    Consistent with normal respiratory flora. Performed at [facility], [name] [place], [place] [address]    Report Status 10/16/2017 FINAL  Final  Culture, blood (Routine X 2) w Reflex to ID Panel     Status: None (Preliminary result)   Collection Time: 10/14/17  9:18 AM  Result Value Ref Range Status   Specimen Description BLOOD WRIST LEFT  Final   Special Requests   Final    BOTTLES DRAWN AEROBIC ONLY Blood Culture adequate volume   Culture   Final   NO GROWTH 4 DAYS Performed at [facility], [address] [place], [place] [address]    Report Status PENDING  Incomplete  Culture, blood (Routine X 2) w Reflex to ID Panel     Status: None (Preliminary result)   Collection Time: 10/14/17  9:19 AM  Result Value Ref Range Status   Specimen Description BLOOD LEFT HAND  Final   Special Requests IN PEDIATRIC BOTTLE Blood Culture adequate volume  Final   Culture   Final    NO GROWTH 4 DAYS Performed at [facility], [address] [place], [place] [address]    Report Status PENDING  Incomplete    Impression/Plan:  1. HIV -continues on renally dosed regimen and no changes.    2.  Fever - improved and afebrile since 3/5. Continue pip-tazo throguh tomorrow  3.  Renal insufficiency - continues on CRRT.    4.  Encephalopathy - continues to improve.

## 2017-10-19 NOTE — Progress Notes (Signed)
Daily Progress Note   Patient Name: Chris [name].       Date: 10/19/2017 DOB: [birth year]  Age: 52 y.o. MRN#: [id number] Attending Physician: Chesley Mires, MD Primary Care Physician: System, Pcp Not In Admit Date: 10/01/2017  Reason for Consultation/Follow-up: Establishing goals of care  52 yo male presented with dyspnea, cough, fever, hypoxia.  Found to have Influenza PNA causing ARDS.  Course complicated by hypervolemia and renal failure.  PMHx of HIV, A fib, COPD, DM.   PMT following along to help provide supportive care and assist with goals of care conversations.   Subjective:  Patient is resting in bed, his eyes are open, he tracks me in the room, he is reportedly following more commands and has been more responsive, especially in the past 24 hours or so.   Length of Stay: 18  Current Medications: Scheduled Meds:  . amiodarone  200 mg Per Tube Daily  . chlorhexidine gluconate (MEDLINE KIT)  15 mL Mouth Rinse BID  . Chlorhexidine Gluconate Cloth  6 each Topical Daily  . clonazePAM  0.5 mg Per Tube QHS  . diltiazem  60 mg Per Tube Q6H  . docusate  100 mg Per Tube BID  . dolutegravir  50 mg Oral Daily  . emtricitabine  200 mg Oral Once every 4 days  . famotidine  20 mg Per Tube QHS  . feeding supplement (VITAL HIGH PROTEIN)  1,000 mL Per Tube Q24H  . insulin aspart  0-20 Units Subcutaneous Q4H  . insulin aspart  5 Units Subcutaneous Q4H  . insulin glargine  35 Units Subcutaneous BID  . ipratropium-albuterol  3 mL Nebulization Q6H  . mouth rinse  15 mL Mouth Rinse QID  . polyethylene glycol  17 g Oral Daily  . sennosides  5 mL Per Tube QHS  . sodium chloride flush  10-40 mL Intracatheter Q12H  . sodium chloride flush  3 mL Intravenous Q12H  . tenofovir  300 mg Oral Q Wed  .  warfarin  10 mg Oral ONCE-1800  . Warfarin - Pharmacist Dosing Inpatient   Does not apply q1800    Continuous Infusions: . sodium chloride 250 mL (10/19/17 1400)  . sodium chloride 100 mL (10/19/17 1400)  . sodium chloride    . dexmedetomidine (PRECEDEX) IV infusion 0.4 mcg/kg/hr (10/19/17 1400)  .  fentaNYL infusion INTRAVENOUS 100 mcg/hr (10/19/17 1400)  . heparin    . piperacillin-tazobactam Stopped (10/19/17 1305)  . dialysis replacement fluid (prismasate) 500 mL/hr at 10/19/17 1021  . dialysis replacement fluid (prismasate) 500 mL/hr at 10/19/17 1018  . dialysate (PRISMASATE) 2,000 mL/hr at 10/19/17 1436    PRN Meds: sodium chloride, sodium chloride, sodium chloride, acetaminophen (TYLENOL) oral liquid 160 mg/5 mL, alteplase, bisacodyl, fentaNYL, heparin, heparin, heparin, hydrALAZINE, lidocaine (PF), lidocaine-prilocaine, metoprolol tartrate, midazolam, ondansetron (ZOFRAN) IV, pentafluoroprop-tetrafluoroeth, sodium chloride flush  Physical Exam         Awake On the vent, has trach, was placed on trials earlier today Regular Appears to have mild edema Appears chronically ill  Vital Signs: BP 113/65 (BP Location: Left Arm)   Pulse 82   Temp 98.2 F (36.8 C) (Oral)   Resp 16   Ht '5\' 9"'  (1.753 m)   Wt 106.9 kg (235 lb 10.8 oz)   SpO2 95%   BMI 34.80 kg/m  SpO2: SpO2: 95 % O2 Device: O2 Device: Ventilator O2 Flow Rate: O2 Flow Rate (L/min): 100 L/min  Intake/output summary:   Intake/Output Summary (Last 24 hours) at 10/19/2017 1459 Last data filed at 10/19/2017 1400 Gross per 24 hour  Intake 2191.47 ml  Output 1915 ml  Net 276.47 ml   LBM: Last BM Date: 10/18/17 Baseline Weight: Weight: 119.4 kg (263 lb 3.7 oz) Most recent weight: Weight: 106.9 kg (235 lb 10.8 oz)       Palliative Assessment/Data: PPS 30%   Flowsheet Rows     Most Recent Value  Intake Tab  Referral Department  Critical care  Unit at Time of Referral  ICU  Palliative Care Primary  Diagnosis  Pulmonary  Date Notified  10/15/17  Palliative Care Type  New Palliative care  Reason for referral  Clarify Goals of Care  Date of Admission  10/01/17  Date first seen by Palliative Care  10/17/17  # of days Palliative referral response time  2 Day(s)  # of days IP prior to Palliative referral  14  Clinical Assessment  Palliative Performance Scale Score  30%  Pain Max last 24 hours  Not able to report  Pain Min Last 24 hours  Not able to report  Dyspnea Max Last 24 Hours  Not able to report  Dyspnea Min Last 24 hours  Not able to report  Nausea Max Last 24 Hours  Not able to report  Nausea Min Last 24 Hours  Not able to report  Anxiety Max Last 24 Hours  Not able to report  Anxiety Min Last 24 Hours  Not able to report  Other Max Last 24 Hours  Not able to report  Psychosocial & Spiritual Assessment  Palliative Care Outcomes  Palliative Care follow-up planned  Yes, Facility      Patient Active Problem List   Diagnosis Date Noted  . Palliative care encounter   . Goals of care, counseling/discussion   . Palliative care by specialist   . Atrial fibrillation with RVR ([place])   . Elevated troponin   . AKI (acute kidney injury) ([place])   . Endotracheally intubated   . Influenza, pneumonia   . Acute on chronic respiratory failure with hypoxia ([place]) 10/01/2017  . Diabetes mellitus type 2 in obese ([place]) 10/01/2017  . COPD with exacerbation ([place]) 10/01/2017  . Atrial fibrillation ([place]) 10/01/2017  . HIV (human immunodeficiency virus infection) ([place]) 10/01/2017  . Tobacco abuse 10/01/2017  . Bronchitis 10/01/2017  . Chronic  diastolic heart failure ([place])- exacerbated by Afib. 09/25/2017  . PAF (paroxysmal atrial fibrillation) ([place]) 09/25/2017  . Chest pain with moderate risk for cardiac etiology 09/25/2017  . Depression 07/21/2017  . GERD (gastroesophageal reflux disease) 07/21/2017  . Acute on chronic respiratory failure with hypoxia ([place]) 07/21/2017  . Dental caries  06/01/2017  . COPD with acute exacerbation ([place]) 05/26/2017  . Acute bronchitis 11/12/2016  . Special screening for malignant neoplasms, colon   . Dysphagia   . Candida esophagitis ([place])   . Abdominal pain, epigastric 07/17/2016  . Candidiasis of mouth 03/27/2016  . Adjustment disorder with depressed mood 01/04/2016  . Callus of foot 01/04/2016  . Hoarseness of voice 10/04/2015  . Type 2 diabetes mellitus with complication, with long-term current use of insulin ([place]) 10/04/2015  . Cigarette nicotine dependence without complication 77/82/4235  . Primary osteoarthritis of right knee 03/26/2015  . Genital warts 01/22/2015  . Other male erectile dysfunction 01/22/2015  . Type 2 diabetes mellitus with complication ([place]) 36/14/4315  . Essential hypertension, benign 12/04/2014  . Atopic eczema 12/04/2014  . COPD exacerbation ([place]) 08/23/2013  . Cyst (solitary) of breast 08/23/2013  . Breast abscess 08/23/2013  . Obstructive sleep apnea syndrome 08/23/2013  . Cigarette smoker 02/17/2013  . Hyperglycemia 02/17/2013  . Uncontrolled diabetes mellitus ([place]) 02/17/2013  . DM (diabetes mellitus) ([place]) 01/27/2013  . Steroid-induced hyperglycemia 01/12/2013  . Chronic respiratory failure with hypoxia and hypercapnia ([place]) 01/12/2013  . COPD  GOLD III, still smoking 01/11/2013  . Nicotine abuse 01/11/2013  . HIV disease ([place]) 12/06/2012    Palliative Care Assessment & Plan   Patient Profile:    Assessment:  Acute hypoxic resp failure, influenza PNA, has history of COPD, used to use O2 PRNs at home prior to this current hospitalization.  A fib with RVR AKI, ATN, CRRT, hyperkalemia, renal following.  Purulent tracheobronchitis Has history of HIV, concerns for esophageal candidiasis Has history of DM   Recommendations/Plan:   Family meeting: PMT met with the patient's wife, along with PCCM colleagues- Dr Lamonte Sakai and bedside RN.   We reviewed the patient's current condition. Wife stated  that she feels like she's being told repeatedly that the patient is not getting better, she was feeling pressured and rushed.   Dr Lamonte Sakai explained in great detail to the patient's wife's satisfaction about the serious and unpredictable nature of the patient's illness. As far as the patient's lungs and kidneys, there simply needs to be an ongoing period of current therapies. SBT trials are being attempted, renal colleagues are also following, patient remains on CRRT.   Brief life review performed: patient's wife states that the patient was independent, used to be in charge of all of his activities of daily living, had no limitations, was on PRN supplemental O2 for his COPD, other wise, he was fully functional. She describes him as a independent person who would not want to be in nursing homes. She becomes tearful and states that she is not ready for him to go out of state, should the patient's condition become such that he was both vent and dialysis dependent. She does not believe that would be the right thing to do to send him out of state.   We frankly, honestly yet compassionately discussed with her that there are a lot of unknowns, we simply need to continue current mode of care and monitor to see whether there are changes in the right direction.   The patient's wife states that she works and  is simply not able to take care of him at home, in this condition.   Extensive supportive care and active listening was provided, the patient's wife appreciated all the information she has been given today.   PMT and primary service to continue to follow up with wife. We have discussed with her that she best represents his wishes, she is his spokesperson and she wants to be by his bedside as much as she can.     Code Status:    Code Status Orders  (From admission, onward)        Start     Ordered   10/01/17 1954  Full code  Continuous     10/01/17 2002    Code Status History    Date Active Date  Inactive Code Status Order ID Comments User Context   10/01/2017 18:05 10/01/2017 20:02 Full Code [id number]  Samella Parr, NP ED   07/21/2017 11:48 07/28/2017 21:07 Full Code [id number]  Rondel Jumbo, PA-C ED   08/14/2015 05:03 08/15/2015 19:10 Full Code [id number]  Rise Patience, MD Inpatient   02/20/2015 18:47 02/21/2015 13:15 Full Code [id number]  Saverio Danker, PA-C Inpatient   12/07/2014 05:37 12/08/2014 13:17 Full Code [id number]  Theressa Millard, MD Inpatient   12/12/2013 17:01 12/14/2013 14:50 Full Code [id number]  Verlee Monte, MD Inpatient   04/10/2013 20:03 04/11/2013 16:01 Full Code [id number]  Olga Millers, MD Inpatient   01/11/2013 05:24 01/13/2013 19:52 Full Code [id number]  Mendel Corning, MD Inpatient       Prognosis:   guarded, continue to monitor disease trajectory.  Discharge Planning:  To Be Determined  Care plan was discussed with Patient's wife, bedside RN, Dr Lamonte Sakai from [facility].    Thank you for allowing the Palliative Medicine Team to assist in the care of this patient.   Time In: 1350 Time Out: 1500 Total Time 70 min  Prolonged Time Billed  yes       Greater than 50%  of this time was spent counseling and coordinating care related to the above assessment and plan.  Loistine Chance, MD [phone]  Please contact Palliative Medicine Team phone at [phone] for questions and concerns.

## 2017-10-19 NOTE — Progress Notes (Addendum)
Palliative Medicine RN Note: Rec'd sign out from weekend PMT provider that patient and family need a family meeting with PCCM, PMT, & nephrology. Our weekend coverage recommended starting with pulm. I have spoken with the PCCM NP, and she will let me know if/when they are available to sit down for a meeting.( Dr Kavin LeechByrum's schedule is already full for today, so PCCM may not be available.) I will update this note as I know more.   Margret ChanceMelanie G. Greyden Besecker, RN, BSN, [facility] Palliative Medicine Team 10/19/2017 10:33 AM Office [phone]  ADDENDUM: PCCM is available at 1400 today. I spoke with Mrs Kahl, who will be there at that time. We will also have a PMT provider there. I have paged [facility] to see if they can make it to the meeting. It is short notice, so they may not be available.  Margret ChanceMelanie G. Abegail Kloeppel, RN, BSN, [facility] Palliative Medicine Team 10/19/2017 11:11 AM Office [phone]

## 2017-10-19 NOTE — Progress Notes (Signed)
RT note-Patient is weaning at this time, continue to monitor.

## 2017-10-19 NOTE — Progress Notes (Signed)
RT note- Attempted ATC, tolerated x 10 min. Moderate amount thick old blood and thick tan secretions. Placed back to PS wean.

## 2017-10-19 NOTE — Progress Notes (Signed)
CSW continuing to follow pt for appropriate SNF placement as need. CSw understands that pt is still on vent at percent. CSW has spoken with wife via phone and wife expresses understanding of placement options if pt is unable to wean from vent. CSW remains available for support to pt and family at this time.   Claude MangesKierra S. Neena Beecham, MSW, LCSW-A Emergency Department Clinical Social Worker [phone]

## 2017-10-19 NOTE — Progress Notes (Signed)
PULMONARY / CRITICAL CARE MEDICINE   Name: Chris RiversJerome A Denise Jr. MRN: [id number] DOB: 08/25/1965    ADMISSION DATE:  10/01/2017 CONSULTATION DATE:  2/21  REFERRING MD:  Juleen ChinaKohut  CHIEF COMPLAINT:  SOB  HISTORY OF PRESENT ILLNESS:   52 yo male presented with dyspnea, cough, fever, hypoxia.  Found to have Influenza PNA causing ARDS.  Course complicated by hypervolemia and renal failure.  PMHx of HIV, A fib, COPD, DM.   SUBJECTIVE:  Tolerating PS wean. [facility] Remains [facility]. More alert.    VITAL SIGNS: BP 127/65   Pulse 74   Temp 98.2 F (36.8 C) (Oral)   Resp (!) 22   Ht 5\' 9"  (1.753 m)   Wt 106.9 kg (235 lb 10.8 oz)   SpO2 91%   BMI 34.80 kg/m   HEMODYNAMICS: CVP:  [8 mmHg-10 mmHg] 10 mmHg  VENTILATOR SETTINGS: Vent Mode: PSV;CPAP FiO2 (%):  [40 %] 40 % Set Rate:  [16 bmp] 16 bmp PEEP:  [5 cmH20] 5 cmH20 Pressure Support:  [12 cmH20] 12 cmH20 Plateau Pressure:  [18 cmH20-25 cmH20] 25 cmH20  INTAKE / OUTPUT: I/O last 3 completed shifts: In: 4266.3 [I.V.:2196.3; NG/GT:1820; IV Piggyback:250] Out: 4123 [Other:4123]  PHYSICAL EXAMINATION:  General - chronically ill appearing, NAD  Eyes - pupils reactive ENT - trach site clean Cardiac - regular, no murmur Chest - resps even non labored on PS wean, no wheeze, rales Abd - soft, non tender Ext - mild generalized edema, improved  Skin - no rashes Neuro - normal strength   LABS:  BMET Recent Labs  Lab 10/18/17 0356 10/18/17 1600 10/19/17 0402  NA 135 132* 133*  K 3.3* 3.5 3.9  CL 98* 96* 97*  CO2 25 26 24   BUN 46* 30* 50*  CREATININE 1.90* 1.25* 2.01*  GLUCOSE 131* 104* 124*   Electrolytes Recent Labs  Lab 10/13/17 0254  10/14/17 0317  10/18/17 0356 10/18/17 1600 10/19/17 0402  CALCIUM 8.5*   < >  --    < > 8.7* 8.6* 8.5*  MG 3.0*  --  3.0*  --   --   --   --   PHOS 7.2*   < > 6.5*   < > 5.1* 3.1 4.7*   < > = values in this interval not displayed.   CBC Recent Labs  Lab  10/17/17 0435 10/18/17 0356 10/19/17 0402  WBC 30.2* 20.4* 16.8*  HGB 8.9* 8.5* 8.2*  HCT 29.3* 28.3* 27.2*  PLT 227 209 213   Coag's Recent Labs  Lab 10/17/17 0435 10/18/17 0356 10/19/17 0402  INR 1.74 2.35 2.76    Sepsis Markers Recent Labs  Lab 10/14/17 0918 10/15/17 0353 10/16/17 0412  PROCALCITON 11.32 6.91 8.65    ABG Recent Labs  Lab 10/13/17 0550 10/13/17 0950  PHART 7.261* 7.315*  PCO2ART 57.9* 51.7*  PO2ART 194* 78.3*    Liver Enzymes Recent Labs  Lab 10/18/17 0356 10/18/17 1600 10/19/17 0402  ALBUMIN 2.0* 2.2* 1.9*    Cardiac Enzymes No results for input(s): TROPONINI, PROBNP in the last 168 hours.  Glucose Recent Labs  Lab 10/18/17 1603 10/18/17 2000 10/18/17 2338 10/19/17 0416 10/19/17 0742 10/19/17 1137  GLUCAP 95 173* 112* 127* 107* 189*    Imaging Dg Chest Port 1 View  Result Date: 10/19/2017 CLINICAL DATA:  Respiratory failure EXAM: PORTABLE CHEST 1 VIEW COMPARISON:  October 17, 2017 FINDINGS: Tracheostomy catheter tip is 7.2 cm above the carina. Feeding tube tip  is below the diaphragm. Right and left jugular catheter tips are both in the superior vena cava. No pneumothorax. There is atelectatic change in the left mid lung. Lungs elsewhere clear. Heart is enlarged with pulmonary vascularity within normal limits, stable. No adenopathy. No appreciable bone lesions. IMPRESSION: Tube and catheter positions as described without pneumothorax. Stable cardiac prominence. Left midlung atelectasis. No edema or consolidation. Electronically Signed   By: Bretta Bang III M.D.   On: 10/19/2017 07:29    STUDIES:  Echo 2/25 >> EF 60 to 65%, mild MR Renal u/s 2/28 >> slight fullness of renal collecting system  CULTURES: Respiratory viral panel 2/21 >> Influenza A H3 Blood 2/21 >> negative Sputum 2/22 >> oral flora Urine 2/26 >> negative Sputum 2/26 >> oral flora Blood 2/27 >> negative Sputum 3/5>>> oral flora Blood  3/6>>>  ANTIBIOTICS: Vancomycin 2/21 >>2/22 Cefepime 2/21 >> 2/28 Tamiflu 2/21 >> 2/26 Fluconazole 2/21 >> 2/27 Anidulafungin 2/27 >>3/6 Zithromax 2/27 >> 3/1  SIGNIFICANT EVENTS: 2/21 Admit 2/22 VDRF 2/27 ID consulted for persistent fever 2/28 Renal consult for AKI, Cardiology consult for A fib with RVR 3/03 Attempted HD 3/04 Changed to CRRT 3/05 Worsening hypoxia >> vent settings adjusted  LINES/TUBES: ETT 2/22 >> 3/1 Left IJ 2/27 >>> Trach 3/01 >>> R IJ HD cath 3/03 >>>  DISCUSSION: Mental status improved after tx for respiratory infection.  Tolerating pressure support more.  Still stuck on CRRT, and this will likely be limiting factor as we progress.  ASSESSMENT / PLAN:  Acute hypoxic/hypercapnic respiratory failure from Influenza PNA. Hx of COPD with emphysema. Purulent tracheobronchitis. - pressure support wean as able - pressure control for rest mode - ok to try ATC  - wean off prednisone - last dose 3/12 - continue scheduled BDs  A fib with RVR. Acute on chronic diastolic CHF with hx of HTN. - continue amiodarone, cardizem, coumadin  Acute renal failure 2nd to ATN. Hyperkalemia. - CRRT per renal  - will likely need perm-cath  - consider transition to iHD over next 24 hrs   Anemia of critical illness and chronic disease. - f/u CBC  Purulent tracheobronchitis. Esophageal candidiasis. Hx of HIV. - ABx per ID - day 6/7 zosyn  - follow cultures  - continue emtriva, tivicay, tenofivir  DM type II. - SSI with lantus  Acute metabolic encephalopathy >> improved 3/09. - klonopin to 0.5 mg qhs - RASS goal 0  - precedex, fentanyl gtt  DVT prophylaxis - heparin gtt with transition to coumadin SUP - pepcid Nutrition - tube feeds Goals of care - full code.  Palliative care consulted. Will meet with PCCM and palliative are 3/11 2pm.  He is making slow improvement, mental status is better, may be able to transition to iHD.  However he is slow to wean from  vent, not tol ATC.  Placement will be difficult (when he is ready) with trach, possible vent and HD and may be limited to out of state search.  Think family should know this as well when discussion overall long term prognosis and long term goals.   Dirk Dress, NP 10/19/2017  11:56 AM Pager: [phone] or [phone]

## 2017-10-19 NOTE — Progress Notes (Signed)
ANTICOAGULATION CONSULT NOTE - Follow Up Consult  Pharmacy Consult for warfarin Indication: pulmonary embolus/Afib  Allergies  Allergen Reactions  . Bactrim [Sulfamethoxazole-Trimethoprim] Hives    Patient Measurements: Height: _0  (175.3 cm) Weight: 235 lb 10.8 oz (106.9 kg) IBW/kg (Calculated) : 70.7   Vital Signs: Temp: 97.6 F (36.4 C) (03/11 0743) Temp Source: Oral (03/11 0743) BP: 123/70 (03/11 0900) Pulse Rate: 69 (03/11 0900)  Labs: Recent Labs    10/17/17 0435  10/18/17 0356 10/18/17 1600 10/19/17 0402  HGB 8.9*  --  8.5*  --  8.2*  HCT 29.3*  --  28.3*  --  27.2*  PLT 227  --  209  --  213  LABPROT 20.2*  --  25.6*  --  28.9*  INR 1.74  --  2.35  --  2.76  HEPARINUNFRC 0.57  --  0.64  --   --   CREATININE 1.91*   < > 1.90* 1.25* 2.01*   < > = values in this interval not displayed.    Estimated Creatinine Clearance: 52.4 mL/min (A) (by C-G formula based on SCr of 2.01 mg/dL (H)).   Medications:  Scheduled:  . amiodarone  200 mg Per Tube Daily  . chlorhexidine gluconate (MEDLINE KIT)  15 mL Mouth Rinse BID  . Chlorhexidine Gluconate Cloth  6 each Topical Daily  . clonazePAM  0.5 mg Per Tube QHS  . darbepoetin (ARANESP) injection - DIALYSIS  100 mcg Intravenous Once  . diltiazem  60 mg Per Tube Q6H  . docusate  100 mg Per Tube BID  . dolutegravir  50 mg Oral Daily  . emtricitabine  200 mg Oral Once every 4 days  . famotidine  20 mg Per Tube QHS  . feeding supplement (VITAL HIGH PROTEIN)  1,000 mL Per Tube Q24H  . insulin aspart  0-20 Units Subcutaneous Q4H  . insulin aspart  5 Units Subcutaneous Q4H  . insulin glargine  35 Units Subcutaneous BID  . ipratropium-albuterol  3 mL Nebulization Q6H  . polyethylene glycol  17 g Oral Daily  . predniSONE  10 mg Per Tube Q breakfast  . sennosides  5 mL Per Tube QHS  . sodium chloride flush  10-40 mL Intracatheter Q12H  . sodium chloride flush  3 mL Intravenous Q12H  . tenofovir  300 mg Oral Q Wed  .  warfarin  10 mg Oral ONCE-1800  . Warfarin - Pharmacist Dosing Inpatient   Does not apply q1800   Infusions:  . sodium chloride 250 mL (10/19/17 0800)  . sodium chloride 100 mL (10/19/17 0800)  . sodium chloride    . dexmedetomidine (PRECEDEX) IV infusion 0.4 mcg/kg/hr (10/19/17 0800)  . fentaNYL infusion INTRAVENOUS 100 mcg/hr (10/19/17 0800)  . heparin    . piperacillin-tazobactam Stopped (10/19/17 6962)  . dialysis replacement fluid (prismasate) 500 mL/hr at 10/18/17 2353  . dialysis replacement fluid (prismasate) 500 mL/hr at 10/19/17 0124  . dialysate (PRISMASATE) 2,000 mL/hr at 10/19/17 9528    Assessment: 40 YOM with Afib on Eliquis PTA, transitioned to heparin/warfarin given possible PE on Eliquis. Patient continues on warfarin alone given therapeutic INR. Today INR is again therapeutic at 2.76, trending up since yesterday. No signs of bleeding noted. Hemoglobin is trending down, platelets are within normal limits.  Goal of Therapy:  INR 2-3 Monitor platelets by anticoagulation protocol: Yes   Plan:  Warfarin 10 mg PO x 1  Daily PT/INR Monitor s/sx of bleeding   Charlene Brooke, PharmD PGY1 Pharmacy  Resident Pager: [phone] After 4:00PM please call Main Pharmacy [phone] 10/19/2017,9:39 AM

## 2017-10-20 ENCOUNTER — Inpatient Hospital Stay (HOSPITAL_COMMUNITY): Payer: Medicare HMO

## 2017-10-20 DIAGNOSIS — J1100 Influenza due to unidentified influenza virus with unspecified type of pneumonia: Secondary | ICD-10-CM

## 2017-10-20 DIAGNOSIS — Z515 Encounter for palliative care: Secondary | ICD-10-CM

## 2017-10-20 DIAGNOSIS — J9601 Acute respiratory failure with hypoxia: Secondary | ICD-10-CM

## 2017-10-20 DIAGNOSIS — N179 Acute kidney failure, unspecified: Secondary | ICD-10-CM

## 2017-10-20 DIAGNOSIS — Z7189 Other specified counseling: Secondary | ICD-10-CM

## 2017-10-20 LAB — PROTIME-INR
INR: 3.15
PROTHROMBIN TIME: 32.1 s — AB (ref 11.4–15.2)

## 2017-10-20 LAB — RENAL FUNCTION PANEL
ALBUMIN: 2 g/dL — AB (ref 3.5–5.0)
ANION GAP: 10 (ref 5–15)
Albumin: 1.5 g/dL — ABNORMAL LOW (ref 3.5–5.0)
Anion gap: 9 (ref 5–15)
BUN: 45 mg/dL — AB (ref 6–20)
BUN: 46 mg/dL — ABNORMAL HIGH (ref 6–20)
CALCIUM: 6.5 mg/dL — AB (ref 8.9–10.3)
CHLORIDE: 108 mmol/L (ref 101–111)
CO2: 25 mmol/L (ref 22–32)
CO2: 19 mmol/L — AB (ref 22–32)
CREATININE: 1.99 mg/dL — AB (ref 0.61–1.24)
CREATININE: 1.89 mg/dL — AB (ref 0.61–1.24)
Calcium: 8.5 mg/dL — ABNORMAL LOW (ref 8.9–10.3)
Chloride: 99 mmol/L — ABNORMAL LOW (ref 101–111)
GFR calc Af Amer: 43 mL/min — ABNORMAL LOW (ref >60)
GFR calc Af Amer: 46 mL/min — ABNORMAL LOW (ref >60)
GFR calc non Af Amer: 40 mL/min — ABNORMAL LOW (ref >60)
GFR, EST NON AFRICAN AMERICAN: 37 mL/min — AB (ref >60)
GLUCOSE: 115 mg/dL — AB (ref 65–99)
GLUCOSE: 126 mg/dL — AB (ref 65–99)
PHOSPHORUS: 3.7 mg/dL (ref 2.5–4.6)
Phosphorus: 2.8 mg/dL (ref 2.5–4.6)
Potassium: 4.3 mmol/L (ref 3.5–5.1)
Potassium: 3.5 mmol/L (ref 3.5–5.1)
SODIUM: 133 mmol/L — AB (ref 135–145)
Sodium: 137 mmol/L (ref 135–145)

## 2017-10-20 LAB — CBC
HCT: 27.3 % — ABNORMAL LOW (ref 39.0–52.0)
Hemoglobin: 8 g/dL — ABNORMAL LOW (ref 13.0–17.0)
MCH: 28.4 pg (ref 26.0–34.0)
MCHC: 29.3 g/dL — AB (ref 30.0–36.0)
MCV: 96.8 fL (ref 78.0–100.0)
PLATELETS: 228 10*3/uL (ref 150–400)
RBC: 2.82 MIL/uL — ABNORMAL LOW (ref 4.22–5.81)
RDW: 16 % — ABNORMAL HIGH (ref 11.5–15.5)
WBC: 17 10*3/uL — ABNORMAL HIGH (ref 4.0–10.5)

## 2017-10-20 LAB — GLUCOSE, CAPILLARY
GLUCOSE-CAPILLARY: 140 mg/dL — AB (ref 65–99)
GLUCOSE-CAPILLARY: 159 mg/dL — AB (ref 65–99)
Glucose-Capillary: 108 mg/dL — ABNORMAL HIGH (ref 65–99)
Glucose-Capillary: 138 mg/dL — ABNORMAL HIGH (ref 65–99)
Glucose-Capillary: 142 mg/dL — ABNORMAL HIGH (ref 65–99)

## 2017-10-20 MED ORDER — WARFARIN SODIUM 10 MG PO TABS
10.0000 mg | ORAL_TABLET | Freq: Once | ORAL | Status: DC
  Filled 2017-10-20: qty 1

## 2017-10-20 NOTE — Progress Notes (Signed)
PULMONARY / CRITICAL CARE MEDICINE   Name: Delia Sitar. MRN: [id number] DOB: [birth year]    ADMISSION DATE:  10/01/2017 CONSULTATION DATE:  2/21  REFERRING MD:  [place] Singer  CHIEF COMPLAINT:  SOB  HISTORY OF PRESENT ILLNESS:   52 yo male presented with dyspnea, cough, fever, hypoxia.  Found to have Influenza PNA causing ARDS.  Course complicated by hypervolemia and renal failure.  PMHx of HIV, A fib, COPD, DM.   SUBJECTIVE:  Tolerated pressure support 10 for several hours earlier today Remains on CRRT   VITAL SIGNS: BP 139/76 (BP Location: Left Arm)   Pulse (!) 108   Temp 99 F (37.2 C) (Oral)   Resp (!) 21   Ht '5\' 9"'  (1.753 m)   Wt 107.7 kg (237 lb 7 oz)   SpO2 92%   BMI 35.06 kg/m   HEMODYNAMICS: CVP:  [4 mmHg-7 mmHg] 4 mmHg  VENTILATOR SETTINGS: Vent Mode: PCV FiO2 (%):  [40 %-50 %] 40 % Set Rate:  [16 bmp] 16 bmp Vt Set:  [600 mL] 600 mL PEEP:  [5 cmH20] 5 cmH20 Pressure Support:  [12 cmH20] 12 cmH20 Plateau Pressure:  [20 cmH20-24 cmH20] 20 cmH20  INTAKE / OUTPUT: I/O last 3 completed shifts: In: 4360.7 [I.V.:1393.7; NG/GT:2767; IV [id number]:754] Out: 3606 [Other:3818]  PHYSICAL EXAMINATION:  General - chronically ill appearing, NAD  Eyes - pupils reactive ENT - trach site clean Cardiac - regular, no murmur Chest - resps even non labored on PS wean, no wheeze, rales Abd - soft, non tender Ext - mild generalized edema, improved  Skin - no rashes Neuro -globally weak, able to lift his arms against gravity, follows commands   LABS:  BMET Recent Labs  Lab 10/19/17 0402 10/19/17 1640 10/20/17 0407  NA 133* 134* 133*  K 3.9 4.2 4.3  CL 97* 101 99*  CO2 '24 24 25  ' BUN 50* 44* 45*  CREATININE 2.01* 1.78* 1.99*  GLUCOSE 124* 137* 115*   Electrolytes Recent Labs  Lab 10/14/17 0317  10/19/17 0402 10/19/17 1640 10/20/17 0407  CALCIUM  --    < > 8.5* 8.0* 8.5*  MG 3.0*  --   --   --   --   PHOS 6.5*   < > 4.7* 4.2 3.7   < > = values in this  interval not displayed.   CBC Recent Labs  Lab 10/18/17 0356 10/19/17 0402 10/20/17 0407  WBC 20.4* 16.8* 17.0*  HGB 8.5* 8.2* 8.0*  HCT 28.3* 27.2* 27.3*  PLT 209 213 228   Coag's Recent Labs  Lab 10/18/17 0356 10/19/17 0402 10/20/17 0407  INR 2.35 2.76 3.15    Sepsis Markers Recent Labs  Lab 10/14/17 0918 10/15/17 0353 10/16/17 0412  PROCALCITON 11.32 6.91 8.65    ABG Recent Labs  Lab 10/19/17 2201  PHART 7.380  PCO2ART 44.4  PO2ART 67.3*    Liver Enzymes Recent Labs  Lab 10/19/17 0402 10/19/17 1640 10/20/17 0407  ALBUMIN 1.9* 1.9* 2.0*    Cardiac Enzymes No results for input(s): TROPONINI, PROBNP in the last 168 hours.  Glucose Recent Labs  Lab 10/19/17 1541 10/19/17 2004 10/19/17 2334 10/20/17 0434 10/20/17 0824 10/20/17 1151  GLUCAP 153* 115* 116* 108* 138* 140*    Imaging Dg Chest Portable 1 View  Result Date: 10/20/2017 CLINICAL DATA:  Respiratory failure, shortness of breath EXAM: PORTABLE CHEST 1 VIEW COMPARISON:  Chest x-ray of 10/19/2017 FINDINGS: Tracheostomy remains in place and central venous lines are unchanged. Cardiomegaly mild  haziness of the perihilar vasculature is again noted which may indicate mild pulmonary vascular congestion. Mild volume loss is noted at the lung bases. Feeding tube is present. IMPRESSION: 1. No change in tracheostomy and central venous lines. 2. There is cardiomegaly present with mild pulmonary vascular congestion. No definite pneumonia or pleural effusion is evident. Electronically Signed   By: Ivar Drape M.D.   On: 10/20/2017 08:57    STUDIES:  Echo 2/25 >> EF 60 to 65%, mild MR Renal u/s 2/28 >> slight fullness of renal collecting system  CULTURES: Respiratory viral panel 2/21 >> Influenza A H3 Blood 2/21 >> negative Sputum 2/22 >> oral flora Urine 2/26 >> negative Sputum 2/26 >> oral flora Blood 2/27 >> negative Sputum 3/5>>> oral flora Blood 3/6>>> negative  ANTIBIOTICS: Vancomycin  2/21 >>2/22 Cefepime 2/21 >> 2/28 Tamiflu 2/21 >> 2/26 Fluconazole 2/21 >> 2/27 Anidulafungin 2/27 >>3/6 Zithromax 2/27 >> 3/1  SIGNIFICANT EVENTS: 2/21 Admit 2/22 VDRF 2/27 ID consulted for persistent fever 2/28 Renal consult for AKI, Cardiology consult for A fib with RVR 3/03 Attempted HD 3/04 Changed to CRRT 3/05 Worsening hypoxia >> vent settings adjusted  LINES/TUBES: ETT 2/22 >> 3/1 Left IJ 2/27 >>> Trach 3/01 >>> R IJ HD cath 3/03 >>>  DISCUSSION: Mental status improved after tx for respiratory infection.  Tolerating pressure support more.  On CVVHD currently, planning to transition off  ASSESSMENT / PLAN:  Acute hypoxic/hypercapnic respiratory failure from Influenza PNA. Hx of COPD with emphysema. Purulent tracheobronchitis. Continue pressure support as able We will try trach collar daily to see if he can tolerate Prednisone ends today 3/12 Scheduled bronchodilators  A fib with RVR. Acute on chronic diastolic CHF with hx of HTN. Currently on amiodarone, Coumadin, diltiazem  Acute renal failure 2nd to ATN. Hyperkalemia. Plan to stop CVVHD on 3/12 whenever filter clots, transition him over to intermittent HD Hopefully he will regain some renal function but unfortunately he is likely to remain dialysis dependent.  This will significantly impact his disposition if we are to continue aggressive care chronically. If he is to continue hemodialysis then he will need a PermCath  Anemia of critical illness and chronic disease. Follow CBC  Purulent tracheobronchitis. Esophageal candidiasis. Hx of HIV. 7-day course of Zosyn ends today 3/12 Continue Emtriva, Tivicay, tenofovir Follow culture data  DM type II. Sliding scale insulin Lantus  Acute metabolic encephalopathy >> improved 3/09. Continue current clonazepam Minimize sedation as able  DVT prophylaxis -Coumadin SUP - pepcid Nutrition - tube feeds  Goals of care - full code.  Met with patient's wife  and also with palliative care on 3/11.  He is making slow improvement, mental status is better, may be able to transition to iHD.  However he is slow to wean from vent, not tol ATC.  Placement will be difficult (when he is ready) with trach, possible vent and HD and may be limited to out of state search.  Think family should know this as well when discussion overall long term prognosis and long term goals.    Independent CC time 32 minutes  Baltazar Apo, MD, PhD 10/20/2017, 4:48 PM  Pulmonary and Critical Care [phone] or if no answer [phone]

## 2017-10-20 NOTE — Progress Notes (Addendum)
10:48am- CSW spoke with pt's wife and receiver permission to send information to all vent/snf facilities at this time. CSW explained that pt need could change but at this time informed wife that CSW would start searching for placement options so that wife and family will know their options in the end. CSW still following for support and any needs at this time.   CSW continuing to follow for discharge needs. CSW aware that scheduled family meeting too place on yesterday. Pt remain tach'd and on vent at 40 percent at this time. CSW to fax pt out to vent/snf facilities one speaking to wife again about placement options.   Claude MangesKierra S. Maahir Horst, MSW, LCSW-A Emergency Department Clinical Social Worker [phone]

## 2017-10-20 NOTE — Progress Notes (Signed)
[facility]   Reason for visit: Follow up on HIV, fever  Interval History:  Afebrile and WBC stable at 17; no acute events.  Palliative care discussion noted.  No associated rash.   Day 7/7 pip-tazo  Physical Exam: Constitutional:  Vitals:   10/20/17 0800 10/20/17 0820  BP: 127/73   Pulse: 80   Resp: 18   Temp: 98.6 F (37 C)   SpO2: 95% 94%   patient continues to be responsive today, nods appropriately Eyes: anicteric, open HENT: + trach Respiratory: respiratory effort on vent; CTA B Cardiovascular: RRR, + SEM GI: soft, + distended,   Review of Systems: Constitutional: denies any chills MS: no pain  Lab Results  Component Value Date   WBC 17.0 (H) 10/20/2017   HGB 8.0 (L) 10/20/2017   HCT 27.3 (L) 10/20/2017   MCV 96.8 10/20/2017   PLT 228 10/20/2017    Lab Results  Component Value Date   CREATININE 1.99 (H) 10/20/2017   BUN 45 (H) 10/20/2017   NA 133 (L) 10/20/2017   K 4.3 10/20/2017   CL 99 (L) 10/20/2017   CO2 25 10/20/2017    Lab Results  Component Value Date   ALT 18 10/02/2017   AST 23 10/02/2017   ALKPHOS 44 10/02/2017     Microbiology: Recent Results (from the past 240 hour(s))  Culture, respiratory (NON-Expectorated)     Status: None   Collection Time: 10/13/17 10:06 AM  Result Value Ref Range Status   Specimen Description TRACHEAL ASPIRATE  Final   Special Requests NONE  Final   Gram Stain   Final    RARE WBC PRESENT, PREDOMINANTLY PMN RARE GRAM POSITIVE COCCI RARE GRAM VARIABLE ROD    Culture   Final    Consistent with normal respiratory flora. Performed at [facility], [address], [place], [place] [address]    Report Status 10/16/2017 FINAL  Final  Culture, blood (Routine X 2) w Reflex to ID Panel     Status: None   Collection Time: 10/14/17  9:18 AM  Result Value Ref Range Status   Specimen Description BLOOD WRIST LEFT  Final   Special Requests   Final    BOTTLES DRAWN AEROBIC ONLY Blood  Culture adequate volume   Culture   Final    NO GROWTH 5 DAYS Performed at [facility], [address] [place], [place] [address]    Report Status 10/19/2017 FINAL  Final  Culture, blood (Routine X 2) w Reflex to ID Panel     Status: None   Collection Time: 10/14/17  9:19 AM  Result Value Ref Range Status   Specimen Description BLOOD LEFT HAND  Final   Special Requests IN PEDIATRIC BOTTLE Blood Culture adequate volume  Final   Culture   Final    NO GROWTH 5 DAYS Performed at [facility], [address], [place], [place] [address]    Report Status 10/19/2017 FINAL  Final    Impression/Plan:  1. HIV -continues on renally dosed regimen and no changes.  If his renal function improves, he may need adjustment of his medications.    2.  Fever - improved and afebrile since 3/5. Continue pip-tazo through today and stop.  3.  Renal insufficiency - continues on CRRT.    4.  Encephalopathy - continues to improve.    At this time, no further needs from ID.  I will sign off, please call if  anything changes.

## 2017-10-20 NOTE — Progress Notes (Signed)
PT was returned to full support

## 2017-10-20 NOTE — Progress Notes (Signed)
[facility] Rounding Note  Background: 52 yo AAM HIV AF COPD MO Admitted w/resp failure, VDRF. + Flu A/PNA; possible PE;  Baseline creatinine 0.79 10/01/17. AKI in setting of above issues. HDX1 3/4 for volume, markedly catabolic/recurrent [name], massively overloaded. CRRT initiated 10/12/17 for massive anasarca /hyperkalemia. R IJ temp cath 3/3 by CCM.   Assessment/Recommendations 1. AKI (acute kidney injury) - normal creatinine at baseline (0.79). Dense ATN. Markedly catabolic. CRRT started 3/4. Weight down 23 kg (if weights accurate). CVP 7. Keeping even now -Volume stable - but no UOP.  However would still be optimistic for eventual renal recovery given normal kidney function at baseline - I actually think I can attempt to transition to IHD.  Since vascath is 70 days old - will put in referral for transition of vascath to [facility] per IR and do HD at bedside maybe 3/13 or 3/14 2. Hyperkalemia - necessitated use of all 0 K fluids for a while, currently just 4 K dialysate and better.  3. VDRF/COPD/ARDS/FluA+ - finished Tamiflu/ATB's; on steroids - weaning. Per CCM 4. HIV - meds; ID following 5. Esophageal candidiasis - noted during intubation. IV antifungals done  6. Fevers/leukocytosis. CCM/ID addressing. Vanco and zosyn for possible PNA 7. AF/RVR - amio/heparin/low dose dilt 8. A on cdCHF EF 60-65%  9. DM - per CCM 10. Encephalopathy - more responsive, sedation ^ d/t agitation. Per nursing follows some commands 11. Anemia- add ESA- give darbe 100 today.  Chris [name] A 10/18/2017, 7:56 AM  Subjective/Interval History:   CRRT 3/4  2/2 hyperkalemia, marked azotemia, high O2 requirement- all better 500/500/2000 Temp cath 3/3 R IJ Palliative care meeting noted, cont full support for now  Objective Vital signs in last 24 hours: Vitals:   10/20/17 0453 10/20/17 0500 10/20/17 0600 10/20/17 0700  BP:  121/66 103/65 124/65  Pulse:  74 75 72  Resp:  16 18 (!) 21  Temp:      TempSrc:      SpO2:  95% 95%  98%  Weight: 107.7 kg (237 lb 7 oz)     Height:       Weight change: 0.8 kg (1 lb 12.2 oz)  Intake/Output Summary (Last 24 hours) at 10/20/2017 0732 Last data filed at 10/20/2017 0700 Gross per 24 hour  Intake 2970.33 ml  Output 2595 ml  Net 375.33 ml   Physical Exam:  Blood pressure 124/65, pulse 72, temperature 98.1 F (36.7 C), temperature source Oral, resp. rate (!) 21, height '5\' 9"'  (1.753 m), weight 107.7 kg (237 lb 7 oz), SpO2 98 %.   Looks best I have seen, alert  R IJ temp HD cath (3/3) CVP 7 Trach/vent/scd's (foley out, bladder scan sludge) Ant clear Tachy S1S2 NoS3 Obese abdomen, + BS still tight SCD's in place Wrinkling skin of feet Pitting edema LE's better but still present  Weight trending         10/18/17 107.3 kg   10/17/17 107.7 kg Changed to keep even  10/16/17 110.4 kg   10/15/17  114 kg   10/15/15 119.8 k   10/13/17 122.5 kg   10/12/17  130.8 kg  CRRT started  10/11/17  127.6 kg    10/10/17  126.1      Recent Labs  Lab 10/17/17 0435 10/17/17 1502 10/18/17 0356 10/18/17 1600 10/19/17 0402 10/19/17 1640 10/20/17 0407  NA 134* 132* 135 132* 133* 134* 133*  K 3.6 3.6 3.3* 3.5 3.9 4.2 4.3  CL 96* 95* 98* 96* 97* 101 99*  CO2 '24 23 25 26 24 24 25  ' GLUCOSE 156* 208* 131* 104* 124* 137* 115*  BUN 52* 51* 46* 30* 50* 44* 45*  CREATININE 1.91* 1.92* 1.90* 1.25* 2.01* 1.78* 1.99*  CALCIUM 8.8* 8.4* 8.7* 8.6* 8.5* 8.0* 8.5*  PHOS 5.5* 5.2* 5.1* 3.1 4.7* 4.2 3.7    Recent Labs  Lab 10/19/17 0402 10/19/17 1640 10/20/17 0407  ALBUMIN 1.9* 1.9* 2.0*    Recent Labs  Lab 10/17/17 0435 10/18/17 0356 10/19/17 0402 10/20/17 0407  WBC 30.2* 20.4* 16.8* 17.0*  HGB 8.9* 8.5* 8.2* 8.0*  HCT 29.3* 28.3* 27.2* 27.3*  MCV 94.2 95.9 94.4 96.8  PLT 227 209 213 228     Recent Labs  Lab 10/19/17 1137 10/19/17 1541 10/19/17 2004 10/19/17 2334 10/20/17 0434  GLUCAP 189* 153* 115* 116* 108*    Studies/Results: Dg Chest Portable 1 View  Result  Date: 10/16/2017 CLINICAL DATA:  Respiratory failure EXAM: PORTABLE CHEST 1 VIEW COMPARISON:  10/14/2017 FINDINGS: Tracheostomy in good position. Bilateral central venous catheter tips in the SVC unchanged. Feeding tube in place with the tip not visualized. Cardiac enlargement with mild vascular congestion which has improved in the interval. Improvement in bibasilar airspace disease. No pleural effusion IMPRESSION: Improvement in bilateral airspace disease. Support lines remain in good position. Electronically Signed   By: Franchot Gallo M.D.   On: 10/16/2017   Dg Chest Port 1 View  Result Date: 10/17/2017 CLINICAL DATA:  Respiratory failure. EXAM: PORTABLE CHEST 1 VIEW COMPARISON:  10/16/2017 FINDINGS: Tracheostomy tube overlies the airway. Bilateral jugular catheters terminate over the upper SVC. Feeding tube courses into the left upper abdomen with tip not imaged. The cardiac silhouette remains enlarged. Mild diffuse prominence of the interstitial markings is unchanged and may be at least in part chronic and secondary to known emphysema. Mild patchy basilar opacities are minimally increased on the right and unchanged on the left. No overt edema, sizable pleural effusion, or pneumothorax is identified. IMPRESSION: Mild bibasilar opacities, minimally increased on the right and which may reflect atelectasis. Electronically Signed   By: Logan Bores M.D.   On: 10/17/2017 07:41   Medications: . sodium chloride 250 mL (10/20/17 0700)  . sodium chloride Stopped (10/19/17 2000)  . sodium chloride    . dexmedetomidine (PRECEDEX) IV infusion 0.502 mcg/kg/hr (10/20/17 0700)  . fentaNYL infusion INTRAVENOUS 100 mcg/hr (10/20/17 0700)  . heparin    . piperacillin-tazobactam Stopped (10/20/17 0615)  . dialysis replacement fluid (prismasate) 500 mL/hr at 10/20/17 0700  . dialysis replacement fluid (prismasate) 500 mL/hr at 10/20/17 0658  . dialysate (PRISMASATE) 2,000 mL/hr at 10/20/17 0543   Medications .  amiodarone  200 mg Per Tube Daily  . chlorhexidine gluconate (MEDLINE KIT)  15 mL Mouth Rinse BID  . Chlorhexidine Gluconate Cloth  6 each Topical Daily  . clonazePAM  0.5 mg Per Tube QHS  . diltiazem  60 mg Per Tube Q6H  . docusate  100 mg Per Tube BID  . dolutegravir  50 mg Oral Daily  . emtricitabine  200 mg Oral Once every 4 days  . famotidine  20 mg Per Tube QHS  . feeding supplement (VITAL HIGH PROTEIN)  1,000 mL Per Tube Q24H  . insulin aspart  0-20 Units Subcutaneous Q4H  . insulin aspart  5 Units Subcutaneous Q4H  . insulin glargine  35 Units Subcutaneous BID  . ipratropium-albuterol  3 mL Nebulization Q6H  . mouth rinse  15 mL Mouth Rinse QID  . polyethylene  glycol  17 g Oral Daily  . sennosides  5 mL Per Tube QHS  . sodium chloride flush  10-40 mL Intracatheter Q12H  . sodium chloride flush  3 mL Intravenous Q12H  . tenofovir  300 mg Oral Q Wed  . Warfarin - Pharmacist Dosing Inpatient   Does not apply [phone]

## 2017-10-20 NOTE — Consult Note (Signed)
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 2.01* 1.78* 1.99*  GFRNONAA >60 37* 42* 37*  GFRAA >60 43* 49* 43*    LIVER FUNCTION TESTS: Recent Labs    11/13/16 1349 03/31/17 0930 05/19/17 1514 07/22/17 0412 09/25/17 1125 10/02/17 0327  10/18/17 1600 10/19/17 0402 10/19/17 1640 10/20/17 0407  BILITOT 0.3 0.4 0.4 0.7 0.5 0.8  --   --   --   --   --   AST 10 12 13  14* 12 23  --   --   --   --   --   ALT 14 18 15 22 12 18   --   --   --   --   --   ALKPHOS 45 56  --  47  --  44  --   --   --   --   --   PROT 8.0  7.6 7.6 8.7* 7.0 7.6  --   --   --   --   --   ALBUMIN 4.1 3.9  3.9  --  3.7  --  3.4*   < > 2.2* 1.9* 1.9* 2.0*   < > = values in this interval not displayed.    TUMOR MARKERS: No results for input(s): AFPTM, CEA, CA199, CHROMGRNA in the last 8760 hours.  Assessment and Plan: Renal failure Patient on CRRT via temp cath which has been in place since 10/11/17.  Patient on coumadin; last dose yesterday.  Typically this is a 5-day hold and INR needs to be <1.5.  Discussed with  patient and wife.  He is consented for tunneled dialysis catheter placement once INR appropriate and coumadin held. He is also consented for temporary dialysis catheter placement if needed before tunneling.  Risks and benefits discussed with the patient including, but not limited to bleeding, infection, vascular injury, pneumothorax which may require chest tube placement, air embolism or even death  All of the patient's questions were answered, patient is agreeable to proceed. Consent signed and in chart.  Thank you for this interesting consult.  I greatly enjoyed meeting Chris A Dubey Jr. and look forward to participating in their care.  A copy of this report was sent to the requesting provider on this date.  Electronically Signed: Hoyt KochKacie Sue-Ellen Truly Stankiewicz, PA 10/20/2017, 11:45 AM   I spent a total of 40 Minutes    in face to face in clinical consultation, greater than 50% of which was counseling/coordinating care for renal failure.

## 2017-10-20 NOTE — Progress Notes (Signed)
RT note- Patient placed back to full support due to low sp02, CPT through the bed performed and ordered. Continue to monitor.

## 2017-10-20 NOTE — NC FL2 (Addendum)
[place] LEVEL OF CARE SCREENING TOOL     IDENTIFICATION  Patient Name: Chris [name]. Birthdate: 06-28-1966 Sex: male Admission Date (Current Location): 10/01/2017  [facility] and [place] Number:  Herbalist and Address:  [facility], [place] [address] [place], [place] [address]      Provider Number: [id number]  Attending Physician Name and Address:  Collene Gobble, MD  Relative Name and Phone Number:       Current Level of Care: Hospital Recommended Level of Care: Vent SNF Prior Approval Number:    Date Approved/Denied:   PASRR Number:  [id number]  Discharge Plan: Other (Comment)(vent/snf)    Current Diagnoses: Patient Active Problem List   Diagnosis Date Noted  . Palliative care encounter   . Goals of care, counseling/discussion   . Palliative care by specialist   . Atrial fibrillation with RVR ([place])   . Elevated troponin   . AKI (acute kidney injury) ([place])   . Endotracheally intubated   . Influenza, pneumonia   . Acute on chronic respiratory failure with hypoxia ([place]) 10/01/2017  . Diabetes mellitus type 2 in obese ([place]) 10/01/2017  . COPD with exacerbation ([place]) 10/01/2017  . Atrial fibrillation ([place]) 10/01/2017  . HIV (human immunodeficiency virus infection) ([place]) 10/01/2017  . Tobacco abuse 10/01/2017  . Bronchitis 10/01/2017  . Chronic diastolic heart failure ([place])- exacerbated by Afib. 09/25/2017  . PAF (paroxysmal atrial fibrillation) ([place]) 09/25/2017  . Chest pain with moderate risk for cardiac etiology 09/25/2017  . Depression 07/21/2017  . GERD (gastroesophageal reflux disease) 07/21/2017  . Acute on chronic respiratory failure with hypoxia ([place]) 07/21/2017  . Dental caries 06/01/2017  . COPD with acute exacerbation ([place]) 05/26/2017  . Acute bronchitis 11/12/2016  . Special screening for malignant neoplasms, colon   . Dysphagia   . Candida esophagitis ([place])   . Abdominal pain, epigastric 07/17/2016   . Candidiasis of mouth 03/27/2016  . Adjustment disorder with depressed mood 01/04/2016  . Callus of foot 01/04/2016  . Hoarseness of voice 10/04/2015  . Type 2 diabetes mellitus with complication, with long-term current use of insulin ([place]) 10/04/2015  . Cigarette nicotine dependence without complication 56/21/3086  . Primary osteoarthritis of right knee 03/26/2015  . Genital warts 01/22/2015  . Other male erectile dysfunction 01/22/2015  . Type 2 diabetes mellitus with complication ([place]) 57/84/6962  . Essential hypertension, benign 12/04/2014  . Atopic eczema 12/04/2014  . COPD exacerbation ([place]) 08/23/2013  . Cyst (solitary) of breast 08/23/2013  . Breast abscess 08/23/2013  . Obstructive sleep apnea syndrome 08/23/2013  . Cigarette smoker 02/17/2013  . Hyperglycemia 02/17/2013  . Uncontrolled diabetes mellitus ([place]) 02/17/2013  . DM (diabetes mellitus) ([place]) 01/27/2013  . Steroid-induced hyperglycemia 01/12/2013  . Chronic respiratory failure with hypoxia and hypercapnia ([place]) 01/12/2013  . COPD  GOLD III, still smoking 01/11/2013  . Nicotine abuse 01/11/2013  . HIV disease ([place]) 12/06/2012    Orientation RESPIRATION BLADDER Height & Weight     (follows commands. )  Vent Continent Weight: 237 lb 7 oz (107.7 kg) Height:  '5\' 9"'  (175.3 cm)  BEHAVIORAL SYMPTOMS/MOOD NEUROLOGICAL BOWEL NUTRITION STATUS      Continent Diet(please see discharge summary. )  AMBULATORY STATUS COMMUNICATION OF NEEDS Skin   Extensive Assist   Surgical wounds(trach placement site. )                       Personal Care Assistance Level of  Assistance  Bathing, Feeding, Dressing Bathing Assistance: Maximum assistance Feeding assistance: Maximum assistance Dressing Assistance: Maximum assistance     Functional Limitations Info  Sight, Hearing, Speech Sight Info: Adequate Hearing Info: Adequate Speech Info: Impaired(pt had trahc placed on 10/09/17.)    SPECIAL CARE FACTORS FREQUENCY  PT  (By licensed PT), OT (By licensed OT), Speech therapy     PT Frequency: 5 times a week OT Frequency: 5 times a week      Speech Therapy Frequency: 5 times a week       Contractures Contractures Info: Not present    Additional Factors Info  Code Status, Allergies Code Status Info: Full  Allergies Info: Bactrim Sulfamethoxazole-trimethoprim           Current Medications (10/20/2017):  This is the current hospital active medication list Current Facility-Administered Medications  Medication Dose Route Frequency Provider Last Rate Last Dose  . 0.9 %  sodium chloride infusion  250 mL Intravenous PRN Hammonds, Sharyn Blitz, MD 10 mL/hr at 10/20/17 1000 250 mL at 10/20/17 1000  . 0.9 %  sodium chloride infusion  100 mL Intravenous PRN Mauricia Area, MD   Stopped at 10/19/17 2000  . 0.9 %  sodium chloride infusion  100 mL Intravenous PRN Deterding, Jeneen Rinks, MD      . acetaminophen (TYLENOL) solution 650 mg  650 mg Per Tube Q6H PRN Rigoberto Noel, MD   650 mg at 10/13/17 1809  . alteplase (CATHFLO ACTIVASE) injection 2 mg  2 mg Intracatheter Once PRN Mauricia Area, MD      . amiodarone (PACERONE) tablet 200 mg  200 mg Per Tube Daily Erick Colace, NP   200 mg at 10/20/17 1005  . bisacodyl (DULCOLAX) suppository 10 mg  10 mg Rectal Daily PRN Lucila Maine C, DO   10 mg at 10/05/17 0911  . chlorhexidine gluconate (MEDLINE KIT) (PERIDEX) 0.12 % solution 15 mL  15 mL Mouth Rinse BID Hammonds, Sharyn Blitz, MD   15 mL at 10/20/17 0826  . Chlorhexidine Gluconate Cloth 2 % PADS 6 each  6 each Topical Daily Rush Farmer, MD   6 each at 10/19/17 1013  . clonazePAM (KLONOPIN) tablet 0.5 mg  0.5 mg Per Tube QHS Chesley Mires, MD   0.5 mg at 10/19/17 2111  . dexmedetomidine (PRECEDEX) 400 MCG/100ML (4 mcg/mL) infusion  0-1.2 mcg/kg/hr Intravenous Titrated Chesley Mires, MD   Stopped at 10/20/17 0801  . diltiazem (CARDIZEM) 10 mg/ml oral suspension 60 mg  60 mg Per Tube Q6H Whiteheart, Kathryn A, NP    60 mg at 10/20/17 0541  . docusate (COLACE) 50 MG/5ML liquid 100 mg  100 mg Per Tube BID Hammonds, Sharyn Blitz, MD   100 mg at 10/20/17 1001  . dolutegravir (TIVICAY) tablet 50 mg  50 mg Oral Daily Riccio, Angela C, DO   50 mg at 10/20/17 1003  . emtricitabine (EMTRIVA) capsule 200 mg  200 mg Oral Once every 4 days Thayer Headings, MD   200 mg at 10/20/17 1002  . famotidine (PEPCID) 40 MG/5ML suspension 20 mg  20 mg Per Tube QHS Rolla Flatten, RPH   20 mg at 10/19/17 2111  . feeding supplement (VITAL HIGH PROTEIN) liquid 1,000 mL  1,000 mL Per Tube Q24H Rush Farmer, MD   1,000 mL at 10/19/17 1614  . fentaNYL (SUBLIMAZE) 5000 mcg / 100 mL (50 mcg/mL) infusion  25-400 mcg/hr Intravenous Continuous Rigoberto Noel, MD   Stopped at 10/20/17  0801  . fentaNYL (SUBLIMAZE) bolus via infusion 50 mcg  50 mcg Intravenous Q1H PRN Hammonds, Sharyn Blitz, MD   50 mcg at 10/14/17 3419  . heparin injection 1,000 Units  1,000 Units Dialysis PRN Deterding, Jeneen Rinks, MD      . heparin injection 1,000-6,000 Units  1,000-6,000 Units CRRT PRN Jamal Maes, MD      . heparinized saline (2000 units/L) primer fluid for CRRT   CRRT PRN Jamal Maes, MD      . hydrALAZINE (APRESOLINE) injection 20 mg  20 mg Intravenous Q4H PRN Anders Simmonds, MD   20 mg at 10/17/17 6222  . insulin aspart (novoLOG) injection 0-20 Units  0-20 Units Subcutaneous Q4H Lucila Maine C, DO   3 Units at 10/20/17 0841  . insulin aspart (novoLOG) injection 5 Units  5 Units Subcutaneous Q4H Chesley Mires, MD   5 Units at 10/20/17 0840  . insulin glargine (LANTUS) injection 35 Units  35 Units Subcutaneous BID Hammonds, Sharyn Blitz, MD   35 Units at 10/20/17 1003  . ipratropium-albuterol (DUONEB) 0.5-2.5 (3) MG/3ML nebulizer solution 3 mL  3 mL Nebulization Q6H Chesley Mires, MD   3 mL at 10/20/17 0846  . lidocaine (PF) (XYLOCAINE) 1 % injection 5 mL  5 mL Intradermal PRN Deterding, Jeneen Rinks, MD      . lidocaine-prilocaine (EMLA) cream 1  application  1 application Topical PRN Mauricia Area, MD      . MEDLINE mouth rinse  15 mL Mouth Rinse QID Collene Gobble, MD   15 mL at 10/20/17 0410  . metoprolol tartrate (LOPRESSOR) injection 2.5-5 mg  2.5-5 mg Intravenous Q3H PRN Sharia Reeve, MD   5 mg at 10/10/17 0054  . midazolam (VERSED) injection 2 mg  2 mg Intravenous Q2H PRN Hammonds, Sharyn Blitz, MD   2 mg at 10/16/17 2043  . ondansetron (ZOFRAN) injection 4 mg  4 mg Intravenous Q6H PRN Hammonds, Sharyn Blitz, MD   4 mg at 10/14/17 1223  . pentafluoroprop-tetrafluoroeth (GEBAUERS) aerosol 1 application  1 application Topical PRN Deterding, Jeneen Rinks, MD      . piperacillin-tazobactam (ZOSYN) IVPB 3.375 g  3.375 g Intravenous Q6H Thayer Headings, MD   Stopped at 10/20/17 9798  . polyethylene glycol (MIRALAX / GLYCOLAX) packet 17 g  17 g Oral Daily Whiteheart, Kathryn A, NP   17 g at 10/19/17 0954  . prismasol BGK 4/2.5 5,000 mL dialysis replacement fluid   CRRT Continuous Jamal Maes, MD 500 mL/hr at 10/20/17 0700    . prismasol BGK 4/2.5 5,000 mL dialysis replacement fluid   CRRT Continuous Jamal Maes, MD 500 mL/hr at 10/20/17 [phone]    . prismasol BGK 4/2.5 5,000 mL dialysis solution   CRRT Continuous Jamal Maes, MD 2,000 mL/hr at 10/20/17 [phone]    . sennosides (SENOKOT) 8.8 MG/5ML syrup 5 mL  5 mL Per Tube QHS Hammonds, Sharyn Blitz, MD   5 mL at 10/19/17 2111  . sodium chloride flush (NS) 0.9 % injection 10-40 mL  10-40 mL Intracatheter Q12H Rush Farmer, MD   10 mL at 10/20/17 1007  . sodium chloride flush (NS) 0.9 % injection 10-40 mL  10-40 mL Intracatheter PRN Rush Farmer, MD      . sodium chloride flush (NS) 0.9 % injection 3 mL  3 mL Intravenous Q12H Samella Parr, NP   3 mL at 10/20/17 1007  . tenofovir (VIREAD) tablet 300 mg  300 mg Oral Q Wed Hatcher, Doroteo Bradford, MD  300 mg at 10/14/17 0916  . warfarin (COUMADIN) tablet 10 mg  10 mg Oral ONCE-1800 Foltanski, Cleaster Corin, [place]      . Warfarin - Pharmacist Dosing  Inpatient   Does not apply q1800 Charlton Haws [facility]         Discharge Medications: Please see discharge summary for a list of discharge medications.  Relevant Imaging Results:  Relevant Lab Results:   Additional Information [id number]- [id number]. Trach placed on 10/09/17. Shiley 1m Cuffed.   KWetzel Bjornstad LCSWA

## 2017-10-20 NOTE — Progress Notes (Signed)
This note also relates to the following rows which could not be included: SpO2 - Cannot attach notes to unvalidated device data  Sutures removed from trach per Dr. Delton CoombesByrum

## 2017-10-20 NOTE — Progress Notes (Addendum)
PHARMACY CONSULT NOTE - Follow Up Consult  Pharmacy Consult for warfarin, zosyn Indication: pulmonary embolus/Afib, recurrent HCAP  Allergies  Allergen Reactions  . Bactrim [Sulfamethoxazole-Trimethoprim] Hives    Patient Measurements: Height: '5\' 9"'  (175.3 cm) Weight: 237 lb 7 oz (107.7 kg) IBW/kg (Calculated) : 70.7   Vital Signs: Temp: 98.6 F (37 C) (03/12 0800) Temp Source: Oral (03/12 0435) BP: 127/73 (03/12 0800) Pulse Rate: 80 (03/12 0800)  Labs: Recent Labs    10/18/17 0356  10/19/17 0402 10/19/17 1640 10/20/17 0407  HGB 8.5*  --  8.2*  --  8.0*  HCT 28.3*  --  27.2*  --  27.3*  PLT 209  --  213  --  228  LABPROT 25.6*  --  28.9*  --  32.1*  INR 2.35  --  2.76  --  3.15  HEPARINUNFRC 0.64  --   --   --   --   CREATININE 1.90*   < > 2.01* 1.78* 1.99*   < > = values in this interval not displayed.    Estimated Creatinine Clearance: 53.1 mL/min (A) (by C-G formula based on SCr of 1.99 mg/dL (H)).   Medications:  Scheduled:  . amiodarone  200 mg Per Tube Daily  . chlorhexidine gluconate (MEDLINE KIT)  15 mL Mouth Rinse BID  . Chlorhexidine Gluconate Cloth  6 each Topical Daily  . clonazePAM  0.5 mg Per Tube QHS  . diltiazem  60 mg Per Tube Q6H  . docusate  100 mg Per Tube BID  . dolutegravir  50 mg Oral Daily  . emtricitabine  200 mg Oral Once every 4 days  . famotidine  20 mg Per Tube QHS  . feeding supplement (VITAL HIGH PROTEIN)  1,000 mL Per Tube Q24H  . insulin aspart  0-20 Units Subcutaneous Q4H  . insulin aspart  5 Units Subcutaneous Q4H  . insulin glargine  35 Units Subcutaneous BID  . ipratropium-albuterol  3 mL Nebulization Q6H  . mouth rinse  15 mL Mouth Rinse QID  . polyethylene glycol  17 g Oral Daily  . sennosides  5 mL Per Tube QHS  . sodium chloride flush  10-40 mL Intracatheter Q12H  . sodium chloride flush  3 mL Intravenous Q12H  . tenofovir  300 mg Oral Q Wed  . Warfarin - Pharmacist Dosing Inpatient   Does not apply q1800    Infusions:  . sodium chloride 250 mL (10/20/17 0900)  . sodium chloride Stopped (10/19/17 2000)  . sodium chloride    . dexmedetomidine (PRECEDEX) IV infusion Stopped (10/20/17 0801)  . fentaNYL infusion INTRAVENOUS Stopped (10/20/17 0801)  . heparin    . piperacillin-tazobactam Stopped (10/20/17 0615)  . dialysis replacement fluid (prismasate) 500 mL/hr at 10/20/17 0700  . dialysis replacement fluid (prismasate) 500 mL/hr at 10/20/17 0658  . dialysate (PRISMASATE) 2,000 mL/hr at 10/20/17 0819    Assessment: 1. Warfarin 45 YOM with Afib on Eliquis PTA, transitioned to warfarin given possible PE on Eliquis (heparin bridge complete). Patient on CRRT transitioning to HD - requires tunneled HD catheter placement by IR, which requires INR < 1.5. Also on mechanical ventilation, tube feeds at stable rate. Today INR is above goal at 3.15, trending up since yesterday. This INR likely reflects receipt of multiple days of high-dose warfarin (15 mg 3/7 - 3/9). No signs of bleeding noted. Hemoglobin is 8.0 trending down, platelets are stable/at baseline.  2. Zosyn Today is day 7/7 of Zosyn for HCAP. Patient is afebrile,  WBC peak was 30, now down to 17. ID to sign off.  Antibiotics: 3/6 Vanc >> 3/7 3/6 Zosyn >> (3/12)  Microbiology: 2/21 RVP + influenza A 2/21 BCx: Neg 2/21 MRSA PCR: Negative  2/22 TA >> Normal respiratory flora 2/26 Urine:Neg 2/26 Respiratory: Negative 2/27 BCx - NGTD 3/5 RCx - Normal respiratory flora  3/6 BCx - NG x 4 days   Goal of Therapy:  INR 2-3 Monitor platelets by anticoagulation protocol: Yes   Plan:  Hold warfarin for IR procedure (tunneled HD cath placement - INR goal < 1.5), likely ~5 days Daily PT/INR Monitor s/sx of bleeding Zosyn to stop after today   Charlene Brooke, PharmD PGY1 Pharmacy Resident Pager: [phone] After 4:00PM please call [place] [phone] 10/20/2017,9:24 AM

## 2017-10-21 LAB — RENAL FUNCTION PANEL
ALBUMIN: 1.9 g/dL — AB (ref 3.5–5.0)
Anion gap: 13 (ref 5–15)
BUN: 89 mg/dL — AB (ref 6–20)
CO2: 22 mmol/L (ref 22–32)
Calcium: 8.8 mg/dL — ABNORMAL LOW (ref 8.9–10.3)
Chloride: 98 mmol/L — ABNORMAL LOW (ref 101–111)
Creatinine, Ser: 3.52 mg/dL — ABNORMAL HIGH (ref 0.61–1.24)
GFR calc Af Amer: 22 mL/min — ABNORMAL LOW (ref >60)
GFR calc non Af Amer: 19 mL/min — ABNORMAL LOW (ref >60)
GLUCOSE: 140 mg/dL — AB (ref 65–99)
PHOSPHORUS: 5.9 mg/dL — AB (ref 2.5–4.6)
POTASSIUM: 5 mmol/L (ref 3.5–5.1)
SODIUM: 133 mmol/L — AB (ref 135–145)

## 2017-10-21 LAB — PROTIME-INR
INR: 2.97
Prothrombin Time: 30.7 seconds — ABNORMAL HIGH (ref 11.4–15.2)

## 2017-10-21 LAB — CBC
HEMATOCRIT: 25.5 % — AB (ref 39.0–52.0)
Hemoglobin: 7.6 g/dL — ABNORMAL LOW (ref 13.0–17.0)
MCH: 29.1 pg (ref 26.0–34.0)
MCHC: 29.8 g/dL — ABNORMAL LOW (ref 30.0–36.0)
MCV: 97.7 fL (ref 78.0–100.0)
PLATELETS: 239 10*3/uL (ref 150–400)
RBC: 2.61 MIL/uL — AB (ref 4.22–5.81)
RDW: 16.7 % — ABNORMAL HIGH (ref 11.5–15.5)
WBC: 18.5 10*3/uL — AB (ref 4.0–10.5)

## 2017-10-21 LAB — GLUCOSE, CAPILLARY
GLUCOSE-CAPILLARY: 146 mg/dL — AB (ref 65–99)
GLUCOSE-CAPILLARY: 114 mg/dL — AB (ref 65–99)
GLUCOSE-CAPILLARY: 134 mg/dL — AB (ref 65–99)
Glucose-Capillary: 141 mg/dL — ABNORMAL HIGH (ref 65–99)
Glucose-Capillary: 148 mg/dL — ABNORMAL HIGH (ref 65–99)
Glucose-Capillary: 104 mg/dL — ABNORMAL HIGH (ref 65–99)
Glucose-Capillary: 139 mg/dL — ABNORMAL HIGH (ref 65–99)

## 2017-10-21 LAB — ABO/RH: ABO/RH(D): A POS

## 2017-10-21 LAB — PREPARE RBC (CROSSMATCH)

## 2017-10-21 MED ORDER — SODIUM CHLORIDE 0.9 % IV SOLN
100.0000 mL | INTRAVENOUS | Status: DC | PRN
Start: 2017-10-21 — End: 2017-10-23

## 2017-10-21 MED ORDER — LIDOCAINE HCL (PF) 1 % IJ SOLN: 5.0000 mL | INTRAMUSCULAR | Status: DC | PRN

## 2017-10-21 MED ORDER — HEPARIN SODIUM (PORCINE) 1000 UNIT/ML DIALYSIS
1000.0000 [IU] | INTRAMUSCULAR | Status: DC | PRN
Start: 2017-10-21 — End: 2017-10-23

## 2017-10-21 MED ORDER — PENTAFLUOROPROP-TETRAFLUOROETH EX AERO: 1.0000 "application " | INHALATION_SPRAY | CUTANEOUS | Status: DC | PRN

## 2017-10-21 MED ORDER — SODIUM CHLORIDE 0.9 % IV SOLN: 100.0000 mL | INTRAVENOUS | Status: DC | PRN

## 2017-10-21 MED ORDER — LIDOCAINE-PRILOCAINE 2.5-2.5 % EX CREA
1.0000 "application " | TOPICAL_CREAM | CUTANEOUS | Status: DC | PRN
  Filled 2017-10-21: qty 5

## 2017-10-21 MED ORDER — EMTRICITABINE 200 MG PO CAPS
200.0000 mg | ORAL_CAPSULE | ORAL | Status: DC
  Administered 2017-10-24 – 2017-10-28 (×2): 200 mg via ORAL
  Filled 2017-10-21 (×3): qty 1

## 2017-10-21 MED ORDER — TENOFOVIR DISOPROXIL FUMARATE 300 MG PO TABS
300.0000 mg | ORAL_TABLET | ORAL | Status: DC
  Administered 2017-10-21 – 2017-10-28 (×2): 300 mg via ORAL
  Filled 2017-10-21 (×2): qty 1

## 2017-10-21 MED ORDER — SODIUM CHLORIDE 0.9 % IV SOLN
Freq: Once | INTRAVENOUS | Status: AC
Start: 2017-10-21 — End: 2017-10-21

## 2017-10-21 MED ORDER — ALTEPLASE 2 MG IJ SOLR
2.0000 mg | Freq: Once | INTRAMUSCULAR | Status: DC | PRN
  Filled 2017-10-21: qty 2

## 2017-10-21 MED ORDER — HEPARIN SODIUM (PORCINE) 1000 UNIT/ML DIALYSIS: 20.0000 [IU]/kg | INTRAMUSCULAR | Status: DC | PRN

## 2017-10-21 NOTE — Progress Notes (Signed)
CPT was not done on the patient at this time. The patient has nausea and vomiting.

## 2017-10-21 NOTE — Progress Notes (Signed)
[facility] Rounding Note  Background: 52 yo AAM HIV AF COPD MO Admitted w/resp failure, VDRF. + Flu A/PNA; possible PE;  Baseline creatinine 0.79 10/01/17. AKI in setting of above issues. HDX1 3/4 for volume, markedly catabolic/recurrent [name], massively overloaded. CRRT initiated 10/12/17 for massive anasarca /hyperkalemia. R IJ temp cath 3/3 by CCM.   Assessment/Recommendations 1. AKI (acute kidney injury) - normal creatinine at baseline (0.79). Dense ATN. Markedly catabolic. CRRT started 3/4-3/12. Weight down 23 kg (if weights accurate). CVP low. - still no UOP.  However would still be optimistic for eventual renal recovery given normal kidney function at baseline - CRTT stopped yest-  attempting to transition to IHD.   vascath is 39 days old have put in referral for transition of vascath to [facility] per IR (INR too high to do at present, trying to reverse) will do HD at bedside today via vascath.  If does not work well enough will ask IR to replace vascath and hold on tunneled til safe with INR 2. Hyperkalemia - necessitated use of all 0 K fluids for a while- was better at end- did go up quite a bit off of CRRT. HD today- mostly for clearance- with low CVP I do not think needs much volume off 3. HTN/vol- BP soft, CVP low- will run even today  4. VDRF/COPD/ARDS/FluA+ - finished Tamiflu/ATB's; on steroids - weaning. Per CCM- still with relatively high O2 need  5. HIV - meds; ID following 6. Esophageal candidiasis - noted during intubation. IV antifungals done  7. Fevers/leukocytosis. CCM/ID addressing. Vanco and zosyn for possible PNA 8. AF/RVR - amio/heparin/low dose dilt 9. A on cdCHF EF 60-65%  10. DM - per CCM 11. Encephalopathy - more responsive, sedation ^ d/t agitation. Per nursing follows some commands 12. Anemia- add ESA- gave darbe 100 3/12- hgb down to 7.6- will transfuse with HD one unit   Chris [name] A 10/18/2017, 7:56 AM  Subjective/Interval History:   CRRT stopped No urine  Alert - it  seems that he understands what I am saying  Palliative care meeting noted, cont full support for now  Objective Vital signs in last 24 hours: Vitals:   10/21/17 0400 10/21/17 0500 10/21/17 0600 10/21/17 0700  BP: 128/69  (!) 106/58 (!) 103/58  Pulse: 83  81 73  Resp: 19  (!) 23 (!) 24  Temp:      TempSrc:      SpO2: 98%  98% 97%  Weight:  105.8 kg (233 lb 4 oz)    Height:       Weight change: -1.9 kg (-3 oz)  Intake/Output Summary (Last 24 hours) at 10/21/2017 0729 Last data filed at 10/21/2017 0700 Gross per 24 hour  Intake 2698.04 ml  Output 973 ml  Net 1725.04 ml   Physical Exam:  Blood pressure (!) 103/58, pulse 73, temperature 97.9 F (36.6 C), temperature source Oral, resp. rate (!) 24, height _0  (1.753 m), weight 105.8 kg (233 lb 4 oz), SpO2 97 %.   Looks best I have seen, alert  R IJ temp HD cath (3/3) CVP 7 Trach/vent/scd's (foley out, bladder scan sludge) Ant clear Tachy S1S2 NoS3 Obese abdomen, + BS still tight SCD's in place Wrinkling skin of feet Pitting edema LE's better but still present  Weight trending         10/18/17 107.3 kg   10/17/17 107.7 kg Changed to keep even  10/16/17 110.4 kg   10/15/17  114 kg   10/15/15 119.8 k  10/13/17 122.5 kg   10/12/17  130.8 kg  CRRT started  10/11/17  127.6 kg    10/10/17  126.1      Recent Labs  Lab 10/18/17 0356 10/18/17 1600 10/19/17 0402 10/19/17 1640 10/20/17 0407 10/20/17 1531 10/21/17 0313  NA 135 132* 133* 134* 133* 137 133*  K 3.3* 3.5 3.9 4.2 4.3 3.5 5.0  CL 98* 96* 97* 101 99* 108 98*  CO2 _0 19* 22  GLUCOSE 131* 104* 124* 137* 115* 126* 140*  BUN 46* 30* 50* 44* 45* 46* 89*  CREATININE 1.90* 1.25* 2.01* 1.78* 1.99* 1.89* 3.52*  CALCIUM 8.7* 8.6* 8.5* 8.0* 8.5* 6.5* 8.8*  PHOS 5.1* 3.1 4.7* 4.2 3.7 2.8 5.9*    Recent Labs  Lab 10/20/17 0407 10/20/17 1531 10/21/17 0313  ALBUMIN 2.0* 1.5* 1.9*    Recent Labs  Lab 10/18/17 0356 10/19/17 0402 10/20/17 0407  10/21/17 0313  WBC 20.4* 16.8* 17.0* 18.5*  HGB 8.5* 8.2* 8.0* 7.6*  HCT 28.3* 27.2* 27.3* 25.5*  MCV 95.9 94.4 96.8 97.7  PLT 209 213 228 239     Recent Labs  Lab 10/20/17 1151 10/20/17 1517 10/20/17 2008 10/20/17 2354 10/21/17 0359  GLUCAP 140* 146* 142* 159* 141*    Studies/Results: Dg Chest Portable 1 View  Result Date: 10/16/2017 CLINICAL DATA:  Respiratory failure EXAM: PORTABLE CHEST 1 VIEW COMPARISON:  10/14/2017 FINDINGS: Tracheostomy in good position. Bilateral central venous catheter tips in the SVC unchanged. Feeding tube in place with the tip not visualized. Cardiac enlargement with mild vascular congestion which has improved in the interval. Improvement in bibasilar airspace disease. No pleural effusion IMPRESSION: Improvement in bilateral airspace disease. Support lines remain in good position. Electronically Signed   By: Franchot Gallo M.D.   On: 10/16/2017   Dg Chest Port 1 View  Result Date: 10/17/2017 CLINICAL DATA:  Respiratory failure. EXAM: PORTABLE CHEST 1 VIEW COMPARISON:  10/16/2017 FINDINGS: Tracheostomy tube overlies the airway. Bilateral jugular catheters terminate over the upper SVC. Feeding tube courses into the left upper abdomen with tip not imaged. The cardiac silhouette remains enlarged. Mild diffuse prominence of the interstitial markings is unchanged and may be at least in part chronic and secondary to known emphysema. Mild patchy basilar opacities are minimally increased on the right and unchanged on the left. No overt edema, sizable pleural effusion, or pneumothorax is identified. IMPRESSION: Mild bibasilar opacities, minimally increased on the right and which may reflect atelectasis. Electronically Signed   By: Logan Bores M.D.   On: 10/17/2017 07:41   Medications: . sodium chloride 250 mL (10/21/17 0000)  . sodium chloride Stopped (10/19/17 2000)  . sodium chloride    . dexmedetomidine (PRECEDEX) IV infusion 0.5 mcg/kg/hr (10/21/17 0033)  .  fentaNYL infusion INTRAVENOUS 100 mcg/hr (10/21/17 0034)  . heparin    . dialysis replacement fluid (prismasate) 500 mL/hr at 10/20/17 0700  . dialysis replacement fluid (prismasate) 500 mL/hr at 10/20/17 0658  . dialysate (PRISMASATE) 2,000 mL/hr at 10/20/17 1101   Medications . amiodarone  200 mg Per Tube Daily  . chlorhexidine gluconate (MEDLINE KIT)  15 mL Mouth Rinse BID  . Chlorhexidine Gluconate Cloth  6 each Topical Daily  . clonazePAM  0.5 mg Per Tube QHS  . diltiazem  60 mg Per Tube Q6H  . docusate  100 mg Per Tube BID  . dolutegravir  50 mg Oral Daily  . [START ON 10/24/2017] emtricitabine  200 mg Oral Once every 4  days  . famotidine  20 mg Per Tube QHS  . feeding supplement (VITAL HIGH PROTEIN)  1,000 mL Per Tube Q24H  . insulin aspart  0-20 Units Subcutaneous Q4H  . insulin aspart  5 Units Subcutaneous Q4H  . insulin glargine  35 Units Subcutaneous BID  . ipratropium-albuterol  3 mL Nebulization Q6H  . mouth rinse  15 mL Mouth Rinse QID  . polyethylene glycol  17 g Oral Daily  . sennosides  5 mL Per Tube QHS  . sodium chloride flush  10-40 mL Intracatheter Q12H  . sodium chloride flush  3 mL Intravenous Q12H  . tenofovir  300 mg Oral Q Wed  . Warfarin - Pharmacist Dosing Inpatient   Does not apply [phone]

## 2017-10-21 NOTE — Progress Notes (Signed)
Rehab Admissions Coordinator Note:  Patient was screened by Clois DupesBoyette, Cledith Kamiya Godwin for appropriateness for an Inpatient Acute Rehab Consult per PT recommendation. Pt currently not appropriate for a rehab consult. I will follow. Clois Dupes.  Mikhia Dusek Godwin 10/21/2017, 1:48 PM  I can be reached at [phone].

## 2017-10-21 NOTE — Progress Notes (Signed)
Daily Progress Note   Patient Name: Chris [name].       Date: 10/21/2017 DOB: 1966/07/24  Age: 52 y.o. MRN#: [id number] Attending Physician: Collene Gobble, MD Primary Care Physician: System, Pcp Not In Admit Date: 10/01/2017  Reason for Consultation/Follow-up: Establishing goals of care  52 yo male presented with dyspnea, cough, fever, hypoxia.  Found to have Influenza PNA causing ARDS.  Course complicated by hypervolemia and renal failure.  PMHx of HIV, A fib, COPD, DM.   PMT following along to help provide supportive care and assist with goals of care conversations.   Subjective:  Patient is resting in bed, his eyes are open, he tracks me in the room, attempts to mouth words to answer simple questions.  Length of Stay: 20  Current Medications: Scheduled Meds:  . amiodarone  200 mg Per Tube Daily  . chlorhexidine gluconate (MEDLINE KIT)  15 mL Mouth Rinse BID  . Chlorhexidine Gluconate Cloth  6 each Topical Daily  . clonazePAM  0.5 mg Per Tube QHS  . diltiazem  60 mg Per Tube Q6H  . docusate  100 mg Per Tube BID  . dolutegravir  50 mg Oral Daily  . [START ON 10/24/2017] emtricitabine  200 mg Oral Once every 4 days  . famotidine  20 mg Per Tube QHS  . feeding supplement (VITAL HIGH PROTEIN)  1,000 mL Per Tube Q24H  . insulin aspart  0-20 Units Subcutaneous Q4H  . insulin aspart  5 Units Subcutaneous Q4H  . insulin glargine  35 Units Subcutaneous BID  . ipratropium-albuterol  3 mL Nebulization Q6H  . mouth rinse  15 mL Mouth Rinse QID  . polyethylene glycol  17 g Oral Daily  . sennosides  5 mL Per Tube QHS  . sodium chloride flush  10-40 mL Intracatheter Q12H  . sodium chloride flush  3 mL Intravenous Q12H  . tenofovir  300 mg Oral Q Wed  . Warfarin - Pharmacist Dosing  Inpatient   Does not apply q1800    Continuous Infusions: . sodium chloride 250 mL (10/21/17 1000)  . sodium chloride Stopped (10/19/17 2000)  . sodium chloride    . sodium chloride    . dexmedetomidine (PRECEDEX) IV infusion Stopped (10/21/17 0800)  . fentaNYL infusion INTRAVENOUS Stopped (10/21/17 0800)  . heparin    .  dialysis replacement fluid (prismasate) 500 mL/hr at 10/20/17 0700  . dialysis replacement fluid (prismasate) 500 mL/hr at 10/20/17 0658  . dialysate (PRISMASATE) 2,000 mL/hr at 10/20/17 1101    PRN Meds: sodium chloride, sodium chloride, sodium chloride, acetaminophen (TYLENOL) oral liquid 160 mg/5 mL, alteplase, bisacodyl, fentaNYL, heparin, heparin, heparin, hydrALAZINE, lidocaine (PF), metoprolol tartrate, midazolam, ondansetron (ZOFRAN) IV, sodium chloride flush  Physical Exam         Awake On the vent, has trach Regular Appears to have mild edema Appears chronically ill  Vital Signs: BP (!) 143/67   Pulse 91   Temp (!) 97.5 F (36.4 C) (Oral)   Resp (!) 23   Ht '5\' 9"'  (1.753 m)   Wt 105.8 kg (233 lb 4 oz)   SpO2 96%   BMI 34.44 kg/m  SpO2: SpO2: 96 % O2 Device: O2 Device: Ventilator O2 Flow Rate: O2 Flow Rate (L/min): 100 L/min  Intake/output summary:   Intake/Output Summary (Last 24 hours) at 10/21/2017 1031 Last data filed at 10/21/2017 1000 Gross per 24 hour  Intake 2594.77 ml  Output 627 ml  Net 1967.77 ml   LBM: Last BM Date: 10/21/17 Baseline Weight: Weight: 119.4 kg (263 lb 3.7 oz) Most recent weight: Weight: 105.8 kg (233 lb 4 oz)       Palliative Assessment/Data: PPS 30%   Flowsheet Rows     Most Recent Value  Intake Tab  Referral Department  Critical care  Unit at Time of Referral  ICU  Palliative Care Primary Diagnosis  Pulmonary  Date Notified  10/15/17  Palliative Care Type  New Palliative care  Reason for referral  Clarify Goals of Care  Date of Admission  10/01/17  Date first seen by Palliative Care  10/17/17  #  of days Palliative referral response time  2 Day(s)  # of days IP prior to Palliative referral  14  Clinical Assessment  Palliative Performance Scale Score  30%  Pain Max last 24 hours  Not able to report  Pain Min Last 24 hours  Not able to report  Dyspnea Max Last 24 Hours  Not able to report  Dyspnea Min Last 24 hours  Not able to report  Nausea Max Last 24 Hours  Not able to report  Nausea Min Last 24 Hours  Not able to report  Anxiety Max Last 24 Hours  Not able to report  Anxiety Min Last 24 Hours  Not able to report  Other Max Last 24 Hours  Not able to report  Psychosocial & Spiritual Assessment  Palliative Care Outcomes  Palliative Care follow-up planned  Yes, Facility      Patient Active Problem List   Diagnosis Date Noted  . Palliative care encounter   . Goals of care, counseling/discussion   . Palliative care by specialist   . Atrial fibrillation with RVR ([place])   . Elevated troponin   . AKI (acute kidney injury) ([place])   . Endotracheally intubated   . Influenza, pneumonia   . Acute on chronic respiratory failure with hypoxia ([place]) 10/01/2017  . Diabetes mellitus type 2 in obese ([place]) 10/01/2017  . COPD with exacerbation ([place]) 10/01/2017  . Atrial fibrillation ([place]) 10/01/2017  . HIV (human immunodeficiency virus infection) ([place]) 10/01/2017  . Tobacco abuse 10/01/2017  . Bronchitis 10/01/2017  . Chronic diastolic heart failure ([place])- exacerbated by Afib. 09/25/2017  . PAF (paroxysmal atrial fibrillation) ([place]) 09/25/2017  . Chest pain with moderate risk for cardiac etiology 09/25/2017  .  Depression 07/21/2017  . GERD (gastroesophageal reflux disease) 07/21/2017  . Acute on chronic respiratory failure with hypoxia ([place]) 07/21/2017  . Dental caries 06/01/2017  . COPD with acute exacerbation ([place]) 05/26/2017  . Acute bronchitis 11/12/2016  . Special screening for malignant neoplasms, colon   . Dysphagia   . Candida esophagitis ([place])   . Abdominal pain,  epigastric 07/17/2016  . Candidiasis of mouth 03/27/2016  . Adjustment disorder with depressed mood 01/04/2016  . Callus of foot 01/04/2016  . Hoarseness of voice 10/04/2015  . Type 2 diabetes mellitus with complication, with long-term current use of insulin ([place]) 10/04/2015  . Cigarette nicotine dependence without complication 78/67/6720  . Primary osteoarthritis of right knee 03/26/2015  . Genital warts 01/22/2015  . Other male erectile dysfunction 01/22/2015  . Type 2 diabetes mellitus with complication ([place]) 94/70/9628  . Essential hypertension, benign 12/04/2014  . Atopic eczema 12/04/2014  . COPD exacerbation ([place]) 08/23/2013  . Cyst (solitary) of breast 08/23/2013  . Breast abscess 08/23/2013  . Obstructive sleep apnea syndrome 08/23/2013  . Cigarette smoker 02/17/2013  . Hyperglycemia 02/17/2013  . Uncontrolled diabetes mellitus ([place]) 02/17/2013  . DM (diabetes mellitus) ([place]) 01/27/2013  . Steroid-induced hyperglycemia 01/12/2013  . Chronic respiratory failure with hypoxia and hypercapnia ([place]) 01/12/2013  . COPD  GOLD III, still smoking 01/11/2013  . Nicotine abuse 01/11/2013  . HIV disease ([place]) 12/06/2012    Palliative Care Assessment & Plan   Patient Profile:    Assessment:  Acute hypoxic resp failure, influenza PNA, has history of COPD, used to use O2 PRNs at home prior to this current hospitalization.  A fib with RVR AKI, ATN, CRRT, hyperkalemia, renal following.  Purulent tracheobronchitis Has history of HIV, concerns for esophageal candidiasis Has history of DM   Recommendations/Plan: Mr. Hoogendoorn is somewhat interactive today, attempts to answer some simple questions by mouthing words.  No family present at time of my encounter.  I was able to reach his wife by phone later in the afternoon.  She reports that she feels that he is a little bit better today than yesterday.  She wants to continue with current interventions to see if he continues to improve.   She had talked with another member of the palliative medicine team about transfer to [facility], however, she wants to defer any further discussion of this at this time.  If he reaches a point where he does not continue to improve, she reports she would want to know about getting another opinion at [facility]    PMT and primary service to continue to follow up with wife. We have discussed with her that she best represents his wishes, she is his spokesperson and she wants to be by his bedside as much as she can.     Code Status:    Code Status Orders  (From admission, onward)        Start     Ordered   10/01/17 1954  Full code  Continuous     10/01/17 2002    Code Status History    Date Active Date Inactive Code Status Order ID Comments User Context   10/01/2017 18:05 10/01/2017 20:02 Full Code [id number]  Samella Parr, NP ED   07/21/2017 11:48 07/28/2017 21:07 Full Code [id number]  Rondel Jumbo, PA-C ED   08/14/2015 05:03 08/15/2015 19:10 Full Code [id number]  Rise Patience, MD Inpatient   02/20/2015 18:47 02/21/2015 13:15 Full Code [id number]  Saverio Danker, PA-C Inpatient   12/07/2014  05:37 12/08/2014 13:17 Full Code [id number]  Theressa Millard, MD Inpatient   12/12/2013 17:01 12/14/2013 14:50 Full Code [id number]  Verlee Monte, MD Inpatient   04/10/2013 20:03 04/11/2013 16:01 Full Code [id number]  Olga Millers, MD Inpatient   01/11/2013 05:24 01/13/2013 19:52 Full Code [id number]  Mendel Corning, MD Inpatient       Prognosis:   guarded, continue to monitor disease trajectory.  Discharge Planning:  To Be Determined  Care plan was discussed with Patient's wife, bedside RN, Dr Lamonte Sakai from [facility].    Thank you for allowing the Palliative Medicine Team to assist in the care of this patient.   Total Time 20 min  Prolonged Time Billed  yes       Greater than 50%  of this time was spent counseling and coordinating care related to the above assessment and plan.  Micheline Rough, MD Micheline Rough, MD [facility] Team [phone]  Please contact Palliative Medicine Team phone at [phone] for questions and concerns.

## 2017-10-21 NOTE — Progress Notes (Signed)
ANTICOAGULATION CONSULT NOTE - Follow Up Consult  Pharmacy Consult for warfarin Indication: pulmonary embolus/Afib  Allergies  Allergen Reactions  . Bactrim [Sulfamethoxazole-Trimethoprim] Hives    Patient Measurements: Height: 5' 9" (175.3 cm) Weight: 233 lb 4 oz (105.8 kg) IBW/kg (Calculated) : 70.7   Vital Signs: Temp: 97.9 F (36.6 C) (03/13 0345) Temp Source: Oral (03/13 0345) BP: 103/58 (03/13 0700) Pulse Rate: 73 (03/13 0700)  Labs: Recent Labs    10/19/17 0402  10/20/17 0407 10/20/17 1531 10/21/17 0313  HGB 8.2*  --  8.0*  --  7.6*  HCT 27.2*  --  27.3*  --  25.5*  PLT 213  --  228  --  239  LABPROT 28.9*  --  32.1*  --  30.7*  INR 2.76  --  3.15  --  2.97  CREATININE 2.01*   < > 1.99* 1.89* 3.52*   < > = values in this interval not displayed.    Estimated Creatinine Clearance: 29.7 mL/min (A) (by C-G formula based on SCr of 3.52 mg/dL (H)).   Medications:  Scheduled:  . amiodarone  200 mg Per Tube Daily  . chlorhexidine gluconate (MEDLINE KIT)  15 mL Mouth Rinse BID  . Chlorhexidine Gluconate Cloth  6 each Topical Daily  . clonazePAM  0.5 mg Per Tube QHS  . diltiazem  60 mg Per Tube Q6H  . docusate  100 mg Per Tube BID  . dolutegravir  50 mg Oral Daily  . [START ON 10/24/2017] emtricitabine  200 mg Oral Once every 4 days  . famotidine  20 mg Per Tube QHS  . feeding supplement (VITAL HIGH PROTEIN)  1,000 mL Per Tube Q24H  . insulin aspart  0-20 Units Subcutaneous Q4H  . insulin aspart  5 Units Subcutaneous Q4H  . insulin glargine  35 Units Subcutaneous BID  . ipratropium-albuterol  3 mL Nebulization Q6H  . mouth rinse  15 mL Mouth Rinse QID  . polyethylene glycol  17 g Oral Daily  . sennosides  5 mL Per Tube QHS  . sodium chloride flush  10-40 mL Intracatheter Q12H  . sodium chloride flush  3 mL Intravenous Q12H  . tenofovir  300 mg Oral Q Wed  . Warfarin - Pharmacist Dosing Inpatient   Does not apply q1800   Infusions:  . sodium chloride 250  mL (10/21/17 0000)  . sodium chloride Stopped (10/19/17 2000)  . sodium chloride    . sodium chloride    . dexmedetomidine (PRECEDEX) IV infusion 0.5 mcg/kg/hr (10/21/17 0033)  . fentaNYL infusion INTRAVENOUS 100 mcg/hr (10/21/17 0034)  . heparin    . dialysis replacement fluid (prismasate) 500 mL/hr at 10/20/17 0700  . dialysis replacement fluid (prismasate) 500 mL/hr at 10/20/17 0658  . dialysate (PRISMASATE) 2,000 mL/hr at 10/20/17 1101    Assessment: 51 YOM with Afib on Eliquis PTA, transitioned to warfarin given possible PE on Eliquis (heparin bridge complete). Patient now off CRRT transitioning to HD - requires tunneled HD catheter placement by IR, which requires INR < 1.5. Also on mechanical ventilation, tube feeds at stable rate.   Today INR is trending down 2.97. No signs of bleeding noted. Hemoglobin is 8.0 trending down, platelets are stable/at baseline.   Goal of Therapy:  INR 2-3 Monitor platelets by anticoagulation protocol: Yes   Plan:  Hold warfarin for IR procedure (tunneled HD cath placement - INR goal < 1.5) Daily PT/INR Monitor s/sx of bleeding     , PharmD PGY1 Pharmacy Resident   Pager: [phone] After 4:00PM please call Main Pharmacy #[phone] 10/21/2017,8:04 AM  

## 2017-10-21 NOTE — Progress Notes (Addendum)
PULMONARY / CRITICAL CARE MEDICINE   Name: Chris [name]. MRN: [id number] DOB: [birth year]    ADMISSION DATE:  10/01/2017 CONSULTATION DATE:  2/21  REFERRING MD:  Wilson Singer  CHIEF COMPLAINT:  SOB  HISTORY OF PRESENT ILLNESS:   52 yo male presented with dyspnea, cough, fever, hypoxia.  Found to have Influenza PNA causing ARDS.  Course complicated by hypervolemia and renal failure.  PMHx of HIV, A fib, COPD, DM.   SUBJECTIVE:  First day of intermittent HD Getting 1 unit of PRBC w/HD for Hgb 7.6 PSV for almost 8 hours- became tachypneic/ diaphoretic-  patient asked to be back on full support Pt complains of leg cramps  VITAL SIGNS: BP (!) 171/73 Comment: WITH PHYSICAL THERAPY; DANGLE AT EDGE OF BED  Pulse 100   Temp 98.1 F (36.7 C) (Oral)   Resp (!) 29   Ht 5' 9" (1.753 m)   Wt 233 lb 4 oz (105.8 kg)   SpO2 96%   BMI 34.44 kg/m   HEMODYNAMICS: CVP:  [2 mmHg-7 mmHg] 7 mmHg  VENTILATOR SETTINGS: Vent Mode: PCV FiO2 (%):  [40 %-50 %] 40 % Set Rate:  [16 bmp] 16 bmp PEEP:  [5 cmH20] 5 cmH20 Pressure Support:  [8 cmH20-10 cmH20] 8 cmH20 Plateau Pressure:  [16 cmH20-32 cmH20] 18 cmH20  INTAKE / OUTPUT: I/O last 3 completed shifts: In: 4134.7 [I.V.:834; NG/GT:3100.7; IV Piggyback:200] Out: 2417 [Other:2417]  PHYSICAL EXAMINATION: General: Chronically ill appearing adult male in NAD  HEENT: MM pink/moist, Pupils 4/reactive, R nare cortrak, trach midline- dry Neuro:  Awake, attempts to mouth words, f/c, generalized weakness CV:  rrr, no m/r/g PULM: even/non-labored on PC , lungs bilaterally clear GI: soft, non-tender, bs active  Extremities: warm/dry, mild generalized edema  Skin: no rashes  LABS:  BMET Recent Labs  Lab 10/20/17 0407 10/20/17 1531 10/21/17 0313  NA 133* 137 133*  K 4.3 3.5 5.0  CL 99* 108 98*  CO2 25 19* 22  BUN 45* 46* 89*  CREATININE 1.99* 1.89* 3.52*  GLUCOSE 115* 126* 140*   Electrolytes Recent Labs  Lab 10/20/17 0407 10/20/17 1531  10/21/17 0313  CALCIUM 8.5* 6.5* 8.8*  PHOS 3.7 2.8 5.9*   CBC Recent Labs  Lab 10/19/17 0402 10/20/17 0407 10/21/17 0313  WBC 16.8* 17.0* 18.5*  HGB 8.2* 8.0* 7.6*  HCT 27.2* 27.3* 25.5*  PLT 213 228 239   Coag's Recent Labs  Lab 10/19/17 0402 10/20/17 0407 10/21/17 0313  INR 2.76 3.15 2.97    Sepsis Markers Recent Labs  Lab 10/15/17 0353 10/16/17 0412  PROCALCITON 6.91 8.65    ABG Recent Labs  Lab 10/19/17 2201  PHART 7.380  PCO2ART 44.4  PO2ART 67.3*    Liver Enzymes Recent Labs  Lab 10/20/17 0407 10/20/17 1531 10/21/17 0313  ALBUMIN 2.0* 1.5* 1.9*    Cardiac Enzymes No results for input(s): TROPONINI, PROBNP in the last 168 hours.  Glucose Recent Labs  Lab 10/20/17 1517 10/20/17 2008 10/20/17 2354 10/21/17 0359 10/21/17 0832 10/21/17 1202  GLUCAP 146* 142* 159* 141* 148* 114*    Imaging No results found.  STUDIES:  Echo 2/25 >> EF 60 to 65%, mild MR Renal u/s 2/28 >> slight fullness of renal collecting system  CULTURES: Respiratory viral panel 2/21 >> Influenza A H3 Blood 2/21 >> negative Sputum 2/22 >> oral flora Urine 2/26 >> negative Sputum 2/26 >> oral flora Blood 2/27 >> negative Sputum 3/5>>> oral flora Blood 3/6>>> negative  ANTIBIOTICS: Vancomycin 2/21 >>2/22  Cefepime 2/21 >> 2/28 Tamiflu 2/21 >> 2/26 Fluconazole 2/21 >> 2/27 Anidulafungin 2/27 >>3/6 Zithromax 2/27 >> 3/1  SIGNIFICANT EVENTS: 2/21 Admit 2/22 VDRF 2/27 ID consulted for persistent fever 2/28 Renal consult for AKI, Cardiology consult for A fib with RVR 3/01 Trach 3/03 Attempted HD 3/04 Changed to CRRT 3/05 Worsening hypoxia >> vent settings adjusted 3/12 CRRT stopped  3/13 first iHD; PSV x 8 hrs  LINES/TUBES: ETT 2/22 >> 3/1 Left IJ 2/27 >>> Trach 3/01 >>> R IJ HD cath 3/03 >>>  DISCUSSION: Mental status improved after tx for respiratory infection.  Tolerating pressure support more.  Transitioned off CRRT 3/12, first day of iHD.   Slow to wean from MV.   ASSESSMENT / PLAN:  Acute hypoxic/hypercapnic respiratory failure from Influenza PNA. Hx of COPD with emphysema. Purulent tracheobronchitis. Continue pressure support as able, rest settings on PCV  Goal is trach collar daily as weaning progresses Prednisone completed 3/12 Continue BD Continue PT   A fib with RVR. Acute on chronic diastolic CHF with hx of HTN. Continue on amiodarone, diltiazem Holding coumadin for placement of vascath   Acute renal failure 2nd to ATN- remains anuric Hyperkalemia. iHD today- even goal today  Renal w/ plans to have vascath placed per IR when INR allows, as current line is 45 days old  Anemia of critical illness and chronic disease Follow CBC Transfuse 1 unit PRBC w/HD today  ESA per renal  Purulent tracheobronchitis. Esophageal candidiasis. Hx of HIV. Appreciate ID following  Completed 7-day course of Zosyn 3/12 Continue Emtriva, Tivicay, tenofovir Cultures neg Monitor clinically  DM type II. Sliding scale insulin Lantus  Acute metabolic encephalopathy >> improved 3/09. Continue current clonazepam Minimize sedation as able  DVT prophylaxis -holding Coumadin/ SCDs SUP - pepcid Nutrition - tube feeds  Goals of care - full code.  Met with patient's wife and also with palliative care on 3/11.  He is making slow improvement, mental status is better.  Transitioned to [facility] 3/13.  He remains slow to liberate from vent, still not tolerating ATC.  transitioned to [facility] 3/13. Long term care placement will be difficult (when he is ready) with trach, possible vent and HD and may be limited to out of state search. Family should be aware of this as well when discussion overall long term prognosis and long term goals.     No family at bedside 3/13.  If remains stable, can likely transfer to vent SDU 3/14.  Kennieth Rad, AGACNP-BC [facility] Pgr: [phone] or if no answer [phone] 10/21/2017, 2:52  PM     I

## 2017-10-21 NOTE — Procedures (Signed)
Patient was seen on dialysis and the procedure was supervised.  BFR 300  Via vascath BP is  148/73.   Patient appears to be tolerating treatment well- says is feeling SOB- will put in for some UF   Chris [name] A 10/21/2017

## 2017-10-21 NOTE — Progress Notes (Signed)
CSW still following for potential placement needs. Pt is still on vent at 40 percent. CSW has faxed pt out to vent./snf's at this time. CSW remains available for support and to follow for needs.   Claude MangesKierra S. Chidi Shirer, MSW, LCSW-A Emergency Department Clinical Social Worker [phone]

## 2017-10-21 NOTE — Evaluation (Signed)
Physical Therapy Evaluation Patient Details Name: Chris RiversJerome A Yzaguirre Jr. MRN: [id number] DOB: 04/13/1966 Today's Date: 10/21/2017   History of Present Illness  52 yo AAM HIV AF COPD MO Admitted w/resp failure, VDRF. + Flu A/PNA; possible PE;  Baseline creatinine 0.79 10/01/17. AKI in setting of above issues. HDX1 3/4 for volume, markedly catabolic/recurrent [name]^K, massively overloaded. CRRT initiated 10/12/17 for massive anasarca /hyperkalemia. R IJ temp cath 3/3 by CCM.   Clinical Impression  Pt admitted with above diagnosis. Pt currently with functional limitations due to the deficits listed below (see PT Problem List). Pt was able to sit EOB x 8 min with min to max assist as he loses balance posteriorly especially as he fatigues.  Does not  use UEs  Well due to weakness.  Will follow acutely.  Pt will benefit from skilled PT to increase their independence and safety with mobility to allow discharge to the venue listed below.      Follow Up Recommendations CIR;Supervision/Assistance - 24 hour    Equipment Recommendations  Other (comment)(TBA)    Recommendations for Other Services       Precautions / Restrictions Precautions Precautions: Fall Restrictions Weight Bearing Restrictions: No      Mobility  Bed Mobility Overal bed mobility: Needs Assistance Bed Mobility: Rolling;Supine to Sit Rolling: Mod assist;+2 for physical assistance   Supine to sit: +2 for physical assistance;Max assist     General bed mobility comments: Pt was able to come to EOB with max assist, needing assist with LEs and elevation of trunk.   Transfers                    Ambulation/Gait             General Gait Details: unable  Stairs            Wheelchair Mobility    Modified Rankin (Stroke Patients Only)       Balance Overall balance assessment: Needs assistance Sitting-balance support: Bilateral upper extremity supported;Feet supported Sitting balance-Leahy Scale: Poor Sitting  balance - Comments: Pt was able to sit EOB for 8 minutes with min to max assist.  Pt with posterior lean.  Trach on vent.  Incr secretions and coughing while up.  Was able to perform LAQ x 5 each LE but cannot achieve full range.  UEs weak therefore 2-/5 movement.  Fatigued quickly and assisted back to supine.  Postural control: Posterior lean                                   Pertinent Vitals/Pain Pain Assessment: No/denies pain  40% FiO2, 5PEEP, 98 bpm, 95%, 143/67  Home Living Family/patient expects to be discharged to:: Private residence Living Arrangements: Spouse/significant other Available Help at Discharge: Family;Available PRN/intermittently(wife works) Type of Home: House Home Access: Level entry     Home Layout: One level Home Equipment: None      Prior Function Level of Independence: Independent         Comments: used O2 PRN     Hand Dominance   Dominant Hand: Right    Extremity/Trunk Assessment   Upper Extremity Assessment Upper Extremity Assessment: Defer to OT evaluation    Lower Extremity Assessment Lower Extremity Assessment: RLE deficits/detail;LLE deficits/detail RLE Deficits / Details: grossly 2/5 LLE Deficits / Details: grossly 2/5    Cervical / Trunk Assessment Cervical / Trunk Assessment: Kyphotic  Communication   Communication:  No difficulties  Cognition Arousal/Alertness: Awake/alert Behavior During Therapy: Flat affect Overall Cognitive Status: Difficult to assess                                        General Comments      Exercises General Exercises - Upper Extremity Shoulder Flexion: AAROM;Both;5 reps;Seated Shoulder ABduction: AAROM;Both;5 reps;Supine General Exercises - Lower Extremity Ankle Circles/Pumps: AROM;Both;5 reps;Seated Long Arc Quad: AAROM;Both;5 reps;Seated   Assessment/Plan    PT Assessment Patient needs continued PT services  PT Problem List Decreased strength;Decreased  activity tolerance;Decreased balance;Decreased mobility;Decreased knowledge of use of DME;Decreased safety awareness;Cardiopulmonary status limiting activity;Decreased knowledge of precautions       PT Treatment Interventions DME instruction;Gait training;Functional mobility training;Therapeutic activities;Therapeutic exercise;Balance training;Patient/family education    PT Goals (Current goals can be found in the Care Plan section)  Acute Rehab PT Goals Patient Stated Goal: to get better PT Goal Formulation: With patient Time For Goal Achievement: 11/04/17 Potential to Achieve Goals: Good    Frequency Min 3X/week   Barriers to discharge Decreased caregiver support(wife works)      Co-evaluation               AM-PAC PT "6 Clicks" Daily Activity  Outcome Measure Difficulty turning over in bed (including adjusting bedclothes, sheets and blankets)?: Unable Difficulty moving from lying on back to sitting on the side of the bed? : Unable Difficulty sitting down on and standing up from a chair with arms (e.g., wheelchair, bedside commode, etc,.)?: Unable Help needed moving to and from a bed to chair (including a wheelchair)?: Total Help needed walking in hospital room?: Total Help needed climbing 3-5 steps with a railing? : Total 6 Click Score: 6    End of Session Equipment Utilized During Treatment: Gait belt;Oxygen(trach on vent) Activity Tolerance: Patient limited by fatigue Patient left: in bed;with call bell/phone within reach;with bed alarm set;with SCD's reapplied(in chair position) Nurse Communication: Mobility status;Need for lift equipment PT Visit Diagnosis: Unsteadiness on feet (R26.81);Muscle weakness (generalized) (M62.81)    Time: 1610-9604 PT Time Calculation (min) (ACUTE ONLY): 24 min   Charges:   PT Evaluation $PT Eval Moderate Complexity: 1 Mod PT Treatments $Therapeutic Activity: 8-22 mins   PT G Codes:        Carrolyn Hilmes,PT Acute  Rehabilitation [phone] (pager)   Berline Lopes 10/21/2017, 1:40 PM

## 2017-10-22 LAB — CBC
HCT: 25.5 % — ABNORMAL LOW (ref 39.0–52.0)
Hemoglobin: 7.7 g/dL — ABNORMAL LOW (ref 13.0–17.0)
MCH: 30 pg (ref 26.0–34.0)
MCHC: 30.2 g/dL (ref 30.0–36.0)
MCV: 99.2 fL (ref 78.0–100.0)
PLATELETS: 213 10*3/uL (ref 150–400)
RBC: 2.57 MIL/uL — AB (ref 4.22–5.81)
RDW: 16.5 % — AB (ref 11.5–15.5)
WBC: 11.6 10*3/uL — ABNORMAL HIGH (ref 4.0–10.5)

## 2017-10-22 LAB — RENAL FUNCTION PANEL
ALBUMIN: 2 g/dL — AB (ref 3.5–5.0)
Anion gap: 12 (ref 5–15)
BUN: 79 mg/dL — AB (ref 6–20)
CALCIUM: 8.5 mg/dL — AB (ref 8.9–10.3)
CO2: 25 mmol/L (ref 22–32)
Chloride: 95 mmol/L — ABNORMAL LOW (ref 101–111)
Creatinine, Ser: 3.54 mg/dL — ABNORMAL HIGH (ref 0.61–1.24)
GFR calc Af Amer: 21 mL/min — ABNORMAL LOW (ref >60)
GFR, EST NON AFRICAN AMERICAN: 19 mL/min — AB (ref >60)
Glucose, Bld: 129 mg/dL — ABNORMAL HIGH (ref 65–99)
PHOSPHORUS: 7.3 mg/dL — AB (ref 2.5–4.6)
POTASSIUM: 4.5 mmol/L (ref 3.5–5.1)
Sodium: 132 mmol/L — ABNORMAL LOW (ref 135–145)

## 2017-10-22 LAB — GLUCOSE, CAPILLARY
GLUCOSE-CAPILLARY: 114 mg/dL — AB (ref 65–99)
GLUCOSE-CAPILLARY: 109 mg/dL — AB (ref 65–99)
Glucose-Capillary: 126 mg/dL — ABNORMAL HIGH (ref 65–99)
Glucose-Capillary: 119 mg/dL — ABNORMAL HIGH (ref 65–99)
Glucose-Capillary: 89 mg/dL (ref 65–99)

## 2017-10-22 LAB — PROTIME-INR
INR: 1.94
PROTHROMBIN TIME: 22 s — AB (ref 11.4–15.2)

## 2017-10-22 LAB — HEPARIN LEVEL (UNFRACTIONATED): HEPARIN UNFRACTIONATED: 0.46 [IU]/mL (ref 0.30–0.70)

## 2017-10-22 MED ORDER — FENTANYL CITRATE (PF) 100 MCG/2ML IJ SOLN
25.0000 ug | INTRAMUSCULAR | Status: DC | PRN
  Administered 2017-10-22 – 2017-10-26 (×15): 25 ug via INTRAVENOUS
  Filled 2017-10-22 (×16): qty 2

## 2017-10-22 MED ORDER — HEPARIN (PORCINE) IN NACL 100-0.45 UNIT/ML-% IJ SOLN
2400.0000 [IU]/h | INTRAMUSCULAR | Status: DC
  Administered 2017-10-22 – 2017-10-23 (×3): 2800 [IU]/h via INTRAVENOUS
  Administered 2017-10-24 – 2017-10-27 (×6): 2400 [IU]/h via INTRAVENOUS
  Filled 2017-10-22 (×23): qty 250

## 2017-10-22 MED ORDER — MIDODRINE HCL 5 MG PO TABS
10.0000 mg | ORAL_TABLET | Freq: Three times a day (TID) | ORAL | Status: DC
  Administered 2017-10-22 – 2017-10-23 (×4): 10 mg
  Filled 2017-10-22 (×5): qty 2

## 2017-10-22 MED ORDER — FENTANYL BOLUS VIA INFUSION
50.0000 ug | INTRAVENOUS | Status: DC | PRN
  Filled 2017-10-22: qty 50

## 2017-10-22 NOTE — Progress Notes (Signed)
Nutrition Follow-up  DOCUMENTATION CODES:   Obesity unspecified  INTERVENTION:    Continue Vital High Protein at  65 ml/h (1560 ml per day) to provide 1560 kcal, 137 gm protein, 1304 ml free water daily.  NUTRITION DIAGNOSIS:   Inadequate oral intake related to inability to eat as evidenced by NPO status.  Ongoing  GOAL:   Provide needs based on ASPEN/SCCM guidelines  Met with TF  MONITOR:   Vent status, TF tolerance, Labs, I & O's  ASSESSMENT:   52 yo male with PMH of COPD, smoker, HIV, DM, depression, GERD, A fib who was admitted on 2/21 with acute on chronic hypoxic respiratory failure, COPD exacerbation, influenza A. Required intubation shortly after admission.  Discussed patient in ICU rounds and with RN today. CRRT stopped 3/12. Today is his first day of IHD. Remains anuric. S/P trach 3/1; still not tolerating trach collar Cortrak tube in place. Tolerating Vital High Protein at 65 ml/h (1560 ml/day) to provide 1560 kcals, 137 gm protein, 1304 ml free water daily. This provides 2500 mg potassium (32 mg/kg standard weight) and 1248 mg phosphorus (16 mg/kg standard weight). Potassium and phosphorus provisions are within recommended amounts for ESRD. Patient remains intubated on ventilator support Temp (24hrs), Avg:98.3 F (36.8 C), Min:97.9 F (36.6 C), Max:98.9 F (37.2 C)   Labs reviewed. Sodium 132 (L), phosphorus 7.3 (H) CBG's: [phone] Medications reviewed and include Colace, Miralax.   Diet Order:  No diet orders on file  EDUCATION NEEDS:   No education needs have been identified at this time  Skin:  Skin Assessment: Reviewed RN Assessment  Last BM:  3/13 type 7  Height:   Ht Readings from Last 1 Encounters:  10/01/17 _0  (1.753 m)    Weight:   Wt Readings from Last 1 Encounters:  10/22/17 232 lb 9.4 oz (105.5 kg)    Ideal Body Weight:  72.7 kg  BMI:  Body mass index is 34.35 kg/m.  Estimated Nutritional Needs:   Kcal:   5176-1607  Protein:  145 gm  Fluid:  1.2 L    Molli Barrows, RD, LDN, [place] Pager [phone] After Hours Pager [phone]

## 2017-10-22 NOTE — Progress Notes (Signed)
PULMONARY / CRITICAL CARE MEDICINE   Name: Chris [name]. MRN: [id number] DOB: [birth year]    ADMISSION DATE:  10/01/2017 CONSULTATION DATE:  2/21  REFERRING MD:  Wilson Singer  CHIEF COMPLAINT:  SOB  HISTORY OF PRESENT ILLNESS:   52 yo male presented with dyspnea, cough, fever, hypoxia.  Found to have Influenza PNA causing ARDS.  Course complicated by hypervolemia and renal failure.  PMHx of HIV, A fib, COPD, DM.   SUBJECTIVE:  Had iHD yesterday.  tol some PS wean - still not tolerating ATC trials.   VITAL SIGNS: BP 120/61   Pulse 64   Temp 98.6 F (37 C) (Oral)   Resp 18   Ht '5\' 9"'  (1.753 m)   Wt 105.5 kg (232 lb 9.4 oz)   SpO2 94%   BMI 34.35 kg/m   HEMODYNAMICS: CVP:  [9 mmHg] 9 mmHg  VENTILATOR SETTINGS: Vent Mode: PSV FiO2 (%):  [40 %] 40 % Set Rate:  [16 bmp] 16 bmp PEEP:  [5 cmH20] 5 cmH20 Pressure Support:  [8 cmH20] 8 cmH20 Plateau Pressure:  [17 cmH20-22 cmH20] 22 cmH20  INTAKE / OUTPUT: I/O last 3 completed shifts: In: 46 [I.V.:917; Blood:315; NG/GT:2615] Out: 1500 [Other:1500]  PHYSICAL EXAMINATION: General: Chronically ill appearing adult male in NAD  HEENT: MM pink/moist, trach c/d,  R nare cortrak Neuro:  Awake, attempts to mouth words, f/c, generalized weakness CV:  rrr, no m/r/g PULM: even/non-labored on PS, clear  GI: soft, non-tender, bs active  Extremities: warm/dry, pitting edema BLE  Skin: no rashes   LABS:  BMET Recent Labs  Lab 10/20/17 1531 10/21/17 0313 10/22/17 0524  NA 137 133* 132*  K 3.5 5.0 4.5  CL 108 98* 95*  CO2 19* 22 25  BUN 46* 89* 79*  CREATININE 1.89* 3.52* 3.54*  GLUCOSE 126* 140* 129*   Electrolytes Recent Labs  Lab 10/20/17 1531 10/21/17 0313 10/22/17 0524  CALCIUM 6.5* 8.8* 8.5*  PHOS 2.8 5.9* 7.3*   CBC Recent Labs  Lab 10/20/17 0407 10/21/17 0313 10/22/17 0524  WBC 17.0* 18.5* 11.6*  HGB 8.0* 7.6* 7.7*  HCT 27.3* 25.5* 25.5*  PLT 228 239 213   Coag's Recent Labs  Lab 10/20/17 0407  10/21/17 0313 10/22/17 0524  INR 3.15 2.97 1.94    Sepsis Markers Recent Labs  Lab 10/16/17 0412  PROCALCITON 8.65    ABG Recent Labs  Lab 10/19/17 2201  PHART 7.380  PCO2ART 44.4  PO2ART 67.3*    Liver Enzymes Recent Labs  Lab 10/20/17 1531 10/21/17 0313 10/22/17 0524  ALBUMIN 1.5* 1.9* 2.0*    Cardiac Enzymes No results for input(s): TROPONINI, PROBNP in the last 168 hours.  Glucose Recent Labs  Lab 10/21/17 1558 10/21/17 1957 10/21/17 2327 10/22/17 0340 10/22/17 0815 10/22/17 1146  GLUCAP 104* 139* 134* 126* 119* 114*    Imaging No results found.  STUDIES:  Echo 2/25 >> EF 60 to 65%, mild MR Renal u/s 2/28 >> slight fullness of renal collecting system  CULTURES: Respiratory viral panel 2/21 >> Influenza A H3 Blood 2/21 >> negative Sputum 2/22 >> oral flora Urine 2/26 >> negative Sputum 2/26 >> oral flora Blood 2/27 >> negative Sputum 3/5>>> oral flora Blood 3/6>>> negative  ANTIBIOTICS: Vancomycin 2/21 >>2/22 Cefepime 2/21 >> 2/28 Tamiflu 2/21 >> 2/26 Fluconazole 2/21 >> 2/27 Anidulafungin 2/27 >>3/6 Zithromax 2/27 >> 3/1  SIGNIFICANT EVENTS: 2/21 Admit 2/22 VDRF 2/27 ID consulted for persistent fever 2/28 Renal consult for AKI, Cardiology consult for A  fib with RVR 3/01 Trach 3/03 Attempted HD 3/04 Changed to CRRT 3/05 Worsening hypoxia >> vent settings adjusted 3/12 CRRT stopped  3/13 first iHD; PSV x 8 hrs  LINES/TUBES: ETT 2/22 >> 3/1 Left IJ 2/27 >>> Trach 3/01 >>> R IJ HD cath 3/03 >>>  DISCUSSION:  Mental status improved after tx for respiratory infection.  Tolerating some PS wean but no ATC as of yet.  Off CRTT with plans for iHD again 3/15.    ASSESSMENT / PLAN:  Acute hypoxic/hypercapnic respiratory failure from Influenza PNA. Hx of COPD with emphysema. Purulent tracheobronchitis. Continue pressure support as able, rest settings on PCV  Attempt ATC daily if tolerating PS  Continue BD Continue PT efforts   Volume removal with HD   A fib with RVR. Acute on chronic diastolic CHF with hx of HTN. Hypotension  Continue on amiodarone, diltiazem Holding coumadin in anticpation of permcath placement  Continue midodrine   Acute renal failure 2nd to ATN- remains anuric Hyperkalemia. iHD 3/13. Will need again 3/15.  IR to place perm cath    Anemia of critical illness and chronic disease Follow CBC ESA per renal  Purulent tracheobronchitis. Esophageal candidiasis. Hx of HIV. Appreciate ID following  Completed 7-day course of Zosyn 3/12 Continue Emtriva, Tivicay, tenofovir Cultures neg Monitor clinically  DM type II. Sliding scale insulin Lantus  Acute metabolic encephalopathy >> improved 3/09. Continue current clonazepam Minimize sedation as able  DVT prophylaxis -holding Coumadin/ SCDs SUP - pepcid Nutrition - tube feeds  Goals of care - full code.  Met with patient's wife and also with palliative care on 3/11.  He is making slow improvement, mental status is better.  Transitioned to [facility] 3/13.  He remains slow to liberate from vent, still not tolerating ATC.  transitioned to [facility] 3/13. Long term care placement will be difficult (when he is ready) with trach, possible vent and HD and may be limited to out of state search.  ATC would increase options for eventual placement.     Nickolas Madrid, NP 10/22/2017  1:23 PM Pager: [phone] or [phone]     I

## 2017-10-22 NOTE — Progress Notes (Signed)
eLink Physician-Brief Progress Note Patient Name: Chris RiversJerome A Cermak Jr. DOB: 07/13/1966 MRN: [id number]   Date of Service  10/22/2017  HPI/Events of Note  Mild pain and agitation s/p discontinuation of Fentanyl infusion.  eICU Interventions  Fentanyl 25 mics q 2 hr prn pain/ agitation     Intervention Category Intermediate Interventions: Pain - evaluation and management Minor Interventions: Agitation / anxiety - evaluation and management  Migdalia DkOkoronkwo U Ogan 10/22/2017, 8:26 PM

## 2017-10-22 NOTE — Progress Notes (Signed)
ANTICOAGULATION CONSULT NOTE - Follow Up Consult  Pharmacy Consult:  Heparin Indication: pulmonary embolus/Afib  Allergies  Allergen Reactions  . Bactrim [Sulfamethoxazole-Trimethoprim] Hives    Patient Measurements: Height: 5\' 9"  (175.3 cm) Weight: 232 lb 9.4 oz (105.5 kg) IBW/kg (Calculated) : 70.7 Heparin dosing weight: 97 kg  Vital Signs: Temp: 98.9 F (37.2 C) (03/14 1632) Temp Source: Oral (03/14 1632) BP: 115/56 (03/14 1600) Pulse Rate: 70 (03/14 1600)  Labs: Recent Labs    10/20/17 0407 10/20/17 1531 10/21/17 0313 10/22/17 0524 10/22/17 1653  HGB 8.0*  --  7.6* 7.7*  --   HCT 27.3*  --  25.5* 25.5*  --   PLT 228  --  239 213  --   LABPROT 32.1*  --  30.7* 22.0*  --   INR 3.15  --  2.97 1.94  --   HEPARINUNFRC  --   --   --   --  0.46  CREATININE 1.99* 1.89* 3.52* 3.54*  --     Estimated Creatinine Clearance: 29.5 mL/min (A) (by C-G formula based on SCr of 3.54 mg/dL (H)).    Assessment: [age] YOM with Afib on Eliquis PTA, transitioned to warfarin given possible PE on Eliquis (heparin bridge complete). Patient now off CRRT transitioning to HD - requires tunneled HD catheter placement by IR, which requires INR < 1.5 per IR notes.   Today INR is trending down to 1.9. Given PE this admission, will begin heparin bridge until IR deems patient appropriate for tunneled catheter placement. Hemoglobin is 7.7 s/p 1 unit PRBC in HD yesterday. Platelets are stable/at baseline.  Heparin level is therapeutic; no bleeding reported.   Goal of Therapy:  INR 2-3  Heparin level 0.3-0.7 units/mL Monitor platelets by anticoagulation protocol: Yes    Plan: Continue heparin gtt at 2800 units/hr F/U AM labs   Errick Salts D. Laney Potashang, PharmD, BCPS Pager:  [phone] 10/22/2017, 5:44 PM

## 2017-10-22 NOTE — Progress Notes (Signed)
Physical Therapy Treatment Patient Details Name: Chris RiversJerome A Sayler Jr. MRN: [id number] DOB: 04/04/1966 Today's Date: 10/22/2017    History of Present Illness 52 yo AAM HIV AF COPD MO Admitted w/resp failure, VDRF. + Flu A/PNA; possible PE;  Baseline creatinine 0.79 10/01/17. AKI in setting of above issues. HDX1 3/4 for volume, markedly catabolic/recurrent [name]^K, massively overloaded. CRRT initiated 10/12/17 for massive anasarca /hyperkalemia. R IJ temp cath 3/3 by CCM.     PT Comments    Patient progressing this session participating in EOB activity with periods of S level for close to 15 s.   Also attempted to stand from EOB with some weight acceptance through legs, but unable to extend antigravity.  Remains appropriate for CIR level rehab when medically stable.  PT to continue during acute stay.   Follow Up Recommendations  CIR;Supervision/Assistance - 24 hour     Equipment Recommendations  Other (comment)(TBA)    Recommendations for Other Services Rehab consult     Precautions / Restrictions Precautions Precautions: Fall Precaution Comments: on vent    Mobility  Bed Mobility Overal bed mobility: Needs Assistance Bed Mobility: Sidelying to Sit;Sit to Supine Rolling: Mod assist;+2 for physical assistance   Supine to sit: +2 for physical assistance;Max assist Sit to supine: +2 for physical assistance;Max assist   General bed mobility comments: Pt was able to come to EOB with max assist, needing assist with LEs and elevation of trunk.   Transfers Overall transfer level: Needs assistance   Transfers: Sit to/from Stand Sit to Stand: +2 physical assistance;Total assist         General transfer comment: attempted partial stand with total assist of 2  Ambulation/Gait                 Stairs            Wheelchair Mobility    Modified Rankin (Stroke Patients Only)       Balance Overall balance assessment: Needs assistance Sitting-balance support: Bilateral upper  extremity supported;Feet supported Sitting balance-Leahy Scale: Poor Sitting balance - Comments: progressed from max to min guard assist to a count of 14 unsupported at EOB     Standing balance-Leahy Scale: Zero                              Cognition Arousal/Alertness: Awake/alert Behavior During Therapy: Flat affect Overall Cognitive Status: Difficult to assess                                        Exercises      General Comments General comments (skin integrity, edema, etc.): patient with trach on vent on PS/CPAP with PEEP of 5, 40% FiO2; VSS throughout      Pertinent Vitals/Pain Pain Assessment: Faces Faces Pain Scale: No hurt    Home Living Family/patient expects to be discharged to:: Private residence Living Arrangements: Spouse/significant other Available Help at Discharge: Family;Available PRN/intermittently(wife works) Type of Home: House Home Access: Level entry   Home Layout: One level Home Equipment: None      Prior Function Level of Independence: Independent      Comments: used O2 PRN   PT Goals (current goals can now be found in the care plan section) Acute Rehab PT Goals Patient Stated Goal: to get better Progress towards PT goals: Progressing toward goals    Frequency  Min 3X/week      PT Plan      Co-evaluation PT/OT/SLP Co-Evaluation/Treatment: Yes Reason for Co-Treatment: Complexity of the patient's impairments (multi-system involvement);For patient/therapist safety PT goals addressed during session: Balance;Mobility/safety with mobility OT goals addressed during session: Strengthening/ROM      AM-PAC PT "6 Clicks" Daily Activity  Outcome Measure  Difficulty turning over in bed (including adjusting bedclothes, sheets and blankets)?: Unable Difficulty moving from lying on back to sitting on the side of the bed? : Unable Difficulty sitting down on and standing up from a chair with arms (e.g.,  wheelchair, bedside commode, etc,.)?: Unable Help needed moving to and from a bed to chair (including a wheelchair)?: Total Help needed walking in hospital room?: Total Help needed climbing 3-5 steps with a railing? : Total 6 Click Score: 6    End of Session Equipment Utilized During Treatment: Gait belt;Oxygen Activity Tolerance: Patient limited by fatigue Patient left: in bed;with call bell/phone within reach;with bed alarm set;with SCD's reapplied   PT Visit Diagnosis: Unsteadiness on feet (R26.81);Muscle weakness (generalized) (M62.81)     Time: 1610-9604 PT Time Calculation (min) (ACUTE ONLY): 28 min  Charges:  $Therapeutic Activity: 8-22 mins                    G CodesSheran Lawless, [place] [phone] 10/22/2017    Elray Mcgregor 10/22/2017, 2:00 PM
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
## 2017-10-22 NOTE — Progress Notes (Signed)
ANTICOAGULATION CONSULT NOTE - Follow Up Consult  Pharmacy Consult for warfarin/heparin Indication: pulmonary embolus/Afib  Allergies  Allergen Reactions  . Bactrim [Sulfamethoxazole-Trimethoprim] Hives    Patient Measurements: Height: '5\' 9"'  (175.3 cm) Weight: 232 lb 9.4 oz (105.5 kg) IBW/kg (Calculated) : 70.7 Heparin dosing weight: 97 kg  Vital Signs: Temp: 98.4 F (36.9 C) (03/14 0817) Temp Source: Oral (03/14 0817) BP: 104/63 (03/14 0814) Pulse Rate: 70 (03/14 0814)  Labs: Recent Labs    10/20/17 0407 10/20/17 1531 10/21/17 0313 10/22/17 0524  HGB 8.0*  --  7.6* 7.7*  HCT 27.3*  --  25.5* 25.5*  PLT 228  --  239 213  LABPROT 32.1*  --  30.7* 22.0*  INR 3.15  --  2.97 1.94  CREATININE 1.99* 1.89* 3.52* 3.54*    Estimated Creatinine Clearance: 29.5 mL/min (A) (by C-G formula based on SCr of 3.54 mg/dL (H)).   Medications:  Scheduled:  . amiodarone  200 mg Per Tube Daily  . chlorhexidine gluconate (MEDLINE KIT)  15 mL Mouth Rinse BID  . Chlorhexidine Gluconate Cloth  6 each Topical Daily  . clonazePAM  0.5 mg Per Tube QHS  . diltiazem  60 mg Per Tube Q6H  . docusate  100 mg Per Tube BID  . dolutegravir  50 mg Oral Daily  . [START ON 10/24/2017] emtricitabine  200 mg Oral Once every 4 days  . famotidine  20 mg Per Tube QHS  . feeding supplement (VITAL HIGH PROTEIN)  1,000 mL Per Tube Q24H  . insulin aspart  0-20 Units Subcutaneous Q4H  . insulin aspart  5 Units Subcutaneous Q4H  . insulin glargine  35 Units Subcutaneous BID  . ipratropium-albuterol  3 mL Nebulization Q6H  . mouth rinse  15 mL Mouth Rinse QID  . midodrine  10 mg Per Tube TID WC  . polyethylene glycol  17 g Oral Daily  . sennosides  5 mL Per Tube QHS  . sodium chloride flush  10-40 mL Intracatheter Q12H  . sodium chloride flush  3 mL Intravenous Q12H  . tenofovir  300 mg Oral Q Wed  . Warfarin - Pharmacist Dosing Inpatient   Does not apply q1800   Infusions:  . sodium chloride 250 mL  (10/22/17 0745)  . sodium chloride Stopped (10/19/17 2000)  . sodium chloride    . sodium chloride    . sodium chloride    . dexmedetomidine (PRECEDEX) IV infusion 0.702 mcg/kg/hr (10/22/17 0745)  . fentaNYL infusion INTRAVENOUS 150 mcg/hr (10/22/17 0745)    Assessment: 54 YOM with Afib on Eliquis PTA, transitioned to warfarin given possible PE on Eliquis (heparin bridge complete). Patient now off CRRT transitioning to HD - requires tunneled HD catheter placement by IR, which requires INR < 1.5 per IR notes. Also on mechanical ventilation, tube feeds at stable rate.   Today INR is trending down to 1.9. Given PE this admission, will begin heparin bridge until IR deems patient appropriate for tunneled catheter placement. No signs of bleeding noted. Hemoglobin is 7.7 s/p 1 unit PRBC in HD yesterday. Platelets are stable/at baseline.   Goal of Therapy:  INR 2-3 Monitor platelets by anticoagulation protocol: Yes   Plan:  Hold warfarin for IR procedure (tunneled HD cath placement - INR goal < 1.5) Start heparin at 2800 units/hr (previously therapeutic on this rate) Check 8-hour heparin level Daily PT/INR, heparin level and CBC Monitor s/sx of bleeding    Charlene Brooke, PharmD PGY1 Pharmacy Resident Pager: [phone] After  4:00PM please call [place] [phone] 10/22/2017,8:50 AM
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
## 2017-10-22 NOTE — Progress Notes (Signed)
Fentanyl 20mL wasted in sink, witnessed per Jolyne LoaHaleigh D, RN.
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
## 2017-10-22 NOTE — Care Management Note (Signed)
Case Management Note  Patient Details  Name: Chris RiversJerome A Yokley Jr. MRN: [id number] Date of Birth: 01/21/1966  Subjective/Objective:  Pt admitted with SOB secondary to flu                Action/Plan:  PTA independent from home.     Expected Discharge Date:                  Expected Discharge Plan:  Skilled Nursing Facility  In-House Referral:  Clinical Social Work  Discharge planning Services  CM Consult  Post Acute Care Choice:    Choice offered to:     DME Arranged:    DME Agency:     HH Arranged:    HH Agency:     Status of Service:     If discussed at [employer] Meetings, dates discussed:    Additional Comments: 10/22/2017  LTACH referral given during LOS 10/22/17, attending in agreement with referral at this time.  Both facilities given referral, [facility] declined to offer bed due to lack of HD bed space, [facility] offered bed pending insurance approval.  CM discussed LTACH discharge with both pt and wife, CM also informed both that bed has been offered by [facility].  Pt/wife in agreement to allow [facility] LT[facility] to initiate insurance auth post discussion with [facility] liaison.  CSW also following as back up plan.    10/16/17 Pt remains on ventilator via trach and CRRT.  Palliative Care consulted.  CM will continue to follow for discharge needs  10/09/17 Pt was unable to wean off ventilator - pt received trach 10/09/17.  CSW informed of pending SNF placement - [facility]. Chris ParrClaxton, Cyruss Arata S, RN 10/22/2017, 3:23 PM

## 2017-10-22 NOTE — Progress Notes (Signed)
RT note- Patient has weaned all day on PS+8/5 PEEP, tolerating well,continue to monitor.

## 2017-10-23 ENCOUNTER — Inpatient Hospital Stay (HOSPITAL_COMMUNITY): Payer: Medicare HMO

## 2017-10-23 LAB — GLUCOSE, CAPILLARY
GLUCOSE-CAPILLARY: 108 mg/dL — AB (ref 65–99)
GLUCOSE-CAPILLARY: 98 mg/dL (ref 65–99)
Glucose-Capillary: 114 mg/dL — ABNORMAL HIGH (ref 65–99)
Glucose-Capillary: 121 mg/dL — ABNORMAL HIGH (ref 65–99)
Glucose-Capillary: 50 mg/dL — ABNORMAL LOW (ref 65–99)
Glucose-Capillary: 51 mg/dL — ABNORMAL LOW (ref 65–99)
Glucose-Capillary: 60 mg/dL — ABNORMAL LOW (ref 65–99)
Glucose-Capillary: 93 mg/dL (ref 65–99)
Glucose-Capillary: 98 mg/dL (ref 65–99)

## 2017-10-23 LAB — HEPARIN LEVEL (UNFRACTIONATED)
HEPARIN UNFRACTIONATED: 0.81 [IU]/mL — AB (ref 0.30–0.70)
HEPARIN UNFRACTIONATED: 0.9 [IU]/mL — AB (ref 0.30–0.70)

## 2017-10-23 LAB — CBC
HCT: 23.6 % — ABNORMAL LOW (ref 39.0–52.0)
Hemoglobin: 7.3 g/dL — ABNORMAL LOW (ref 13.0–17.0)
MCH: 29.9 pg (ref 26.0–34.0)
MCHC: 30.9 g/dL (ref 30.0–36.0)
MCV: 96.7 fL (ref 78.0–100.0)
PLATELETS: 259 10*3/uL (ref 150–400)
RBC: 2.44 MIL/uL — ABNORMAL LOW (ref 4.22–5.81)
RDW: 16.2 % — AB (ref 11.5–15.5)
WBC: 11.8 10*3/uL — AB (ref 4.0–10.5)

## 2017-10-23 LAB — RENAL FUNCTION PANEL
ALBUMIN: 2.1 g/dL — AB (ref 3.5–5.0)
ANION GAP: 14 (ref 5–15)
BUN: 119 mg/dL — ABNORMAL HIGH (ref 6–20)
CO2: 23 mmol/L (ref 22–32)
Calcium: 8.5 mg/dL — ABNORMAL LOW (ref 8.9–10.3)
Chloride: 94 mmol/L — ABNORMAL LOW (ref 101–111)
Creatinine, Ser: 5.27 mg/dL — ABNORMAL HIGH (ref 0.61–1.24)
GFR calc non Af Amer: 11 mL/min — ABNORMAL LOW (ref >60)
GFR, EST AFRICAN AMERICAN: 13 mL/min — AB (ref >60)
Glucose, Bld: 107 mg/dL — ABNORMAL HIGH (ref 65–99)
PHOSPHORUS: 10.7 mg/dL — AB (ref 2.5–4.6)
POTASSIUM: 5.5 mmol/L — AB (ref 3.5–5.1)
SODIUM: 131 mmol/L — AB (ref 135–145)

## 2017-10-23 LAB — PREPARE RBC (CROSSMATCH)

## 2017-10-23 LAB — PROTIME-INR
INR: 1.48
PROTHROMBIN TIME: 17.8 s — AB (ref 11.4–15.2)

## 2017-10-23 MED ORDER — DEXTROSE 50 % IV SOLN
1.0000 | Freq: Once | INTRAVENOUS | Status: AC
  Administered 2017-10-23: 50 mL via INTRAVENOUS
  Filled 2017-10-23: qty 50

## 2017-10-23 MED ORDER — IPRATROPIUM-ALBUTEROL 0.5-2.5 (3) MG/3ML IN SOLN
3.0000 mL | Freq: Three times a day (TID) | RESPIRATORY_TRACT | Status: DC
  Administered 2017-10-23 – 2017-10-30 (×23): 3 mL via RESPIRATORY_TRACT
  Filled 2017-10-23 (×22): qty 3

## 2017-10-23 MED ORDER — DEXTROSE 50 % IV SOLN
INTRAVENOUS | Status: AC
  Administered 2017-10-23: 50 mL via INTRAVENOUS
  Filled 2017-10-23: qty 50

## 2017-10-23 MED ORDER — DEXTROSE 50 % IV SOLN
INTRAVENOUS | Status: AC
  Administered 2017-10-23: 25 mL via INTRAVENOUS
  Filled 2017-10-23: qty 50

## 2017-10-23 MED ORDER — DEXTROSE 50 % IV SOLN
25.0000 mL | Freq: Once | INTRAVENOUS | Status: AC
  Administered 2017-10-23: 25 mL via INTRAVENOUS

## 2017-10-23 MED ORDER — ACETAMINOPHEN 160 MG/5ML PO SOLN: 650.0000 mg | Freq: Four times a day (QID) | ORAL | Status: DC | PRN

## 2017-10-23 MED ORDER — SODIUM CHLORIDE 0.9 % IV SOLN: Freq: Once | INTRAVENOUS | Status: DC

## 2017-10-23 MED ORDER — DEXTROSE 50 % IV SOLN
50.0000 mL | Freq: Once | INTRAVENOUS | Status: DC
  Filled 2017-10-23: qty 50

## 2017-10-23 NOTE — Progress Notes (Signed)
  Speech Language Pathology Treatment: Hillary BowPassy Muir Speaking valve  Patient Details Name: Chris FavorsJerome A Nicholl [name] MRN: [id number] DOB: 06/10/1966 Today's Date: 10/23/2017 Time: [id number] SLP Time Calculation (min) (ACUTE ONLY): 37 min  Assessment / Plan / Recommendation Clinical Impression  Pt tolerated the PMV for 25 min on PSV after cuff was deflated and PEEP was reduced (5 to 0). RT present to assist throughout tx. Pt could cough to expectorate secretions orally, but was also noted to have saliva intermittently pooling in his oral cavity, requiring suction. His vocal quality was moderately low in intensity and mildly hoarse, impacting intelligibility of speech at the sentence level moderately. SLP provided Mod cues for increased vocal intensity and coordinating speech on exhalations. Pt was oriented to location and time but disoriented to situation - repeatedly saying that [place] 10 [place] (no physical or written evidence to support this). Vital signs remained stable across the duration of this trial until SpO2 began to drop mildly from the low 90s to 88%. Pt was returned to the vent and RT returned vent to original settings. Will continue use of inline PMV with transition to TC as able.   HPI HPI: 52 yo male presented with dyspnea, cough, fever, hypoxia. Found to have Influenza PNA causing ARDS. ETT 2/21-3/1, trach placed 3/1.Course complicated by hypervolemia and renal failure. PMHx of HIV, A fib, COPD, DM.       SLP Plan  Continue with current plan of care       Recommendations         Patient may use Passy-Muir Speech Valve: with SLP only(and RT) PMSV Supervision: Full         Oral Care Recommendations: Oral care QID Follow up Recommendations: Inpatient Rehab SLP Visit Diagnosis: Aphonia (R49.1) Plan: Continue with current plan of care       GO                Chris [name], Redmond Whittley 10/23/2017, 3:05 PM  Chris HamLaura  [name], M.A. CCC-SLP [phone]

## 2017-10-23 NOTE — Progress Notes (Signed)
[facility] Rounding Note  Background: 52 yo AAM HIV AF COPD MO Admitted w/resp failure, VDRF. + Flu A/PNA; possible PE;  Baseline creatinine 0.79 10/01/17. AKI in setting of above issues. HDX1 3/4 for volume, markedly catabolic/recurrent [name], massively overloaded. CRRT initiated 10/12/17 for massive anasarca /hyperkalemia. R IJ temp cath 3/3 by CCM.   Assessment/Recommendations 1. AKI  - normal creatinine at baseline (0.79). Dense ATN. Markedly catabolic. CRRT  3/4-3/12. Weight down  CVP low. - still no UOP.  However would still be optimistic for eventual renal recovery given normal kidney function at baseline - CRTT stopped 3/12-   IHD  (3/13) .   vascath is now 27 days old have put in referral for transition of vascath to [facility] per IR (INR too high to do at first, trying to reverse) worked OK on 3/13.  Will need HD today (3/15)  INR is 1.48 not sure what their threshold is 2. Hyperkalemia - necessitated use of all 0 K fluids for a while- was better at end- did go up quite a bit off of CRRT. HD today-  3. HTN/vol- - ended up taking 1.5 liters off 3/13 due to high BP at the time and him c/o SOB - BP soft, added midodrine 4. VDRF/COPD/ARDS/FluA+ - finished Tamiflu/ATB's; on steroids - weaning. Per CCM- still with relatively high O2 need  5. HIV - meds; ID following 6. Esophageal candidiasis - noted during intubation. IV antifungals done  7. Fevers/leukocytosis. CCM/ID addressing. Vanco and zosyn for possible PNA- now off 8. AF/RVR - amio/heparin/low dose dilt 9. A on cdCHF EF 60-65%  10. Anemia- added ESA- gave darbe 100 3/12- hgb down to 7.6-  transfuse with HD one unit on 3/13- did not make much difference  Havannah Streat A 10/18/2017, 7:56 AM  Subjective/Interval History:   No urine  Alert - it seems that he understands what I am saying - seen on HD  Palliative care meeting previously , cont full support for now  Objective Vital signs in last 24 hours: Vitals:   10/23/17 0401 10/23/17 0445  10/23/17 0500 10/23/17 0600  BP:   140/69 129/63  Pulse:  69 66 67  Resp:  (!) 23 (!) 23 (!) 21  Temp:      TempSrc:      SpO2:  93% 93% 92%  Weight: 95 kg (209 lb 7 oz)     Height:       Weight change: -12.4 kg (-5.4 oz)  Intake/Output Summary (Last 24 hours) at 10/23/2017 0734 Last data filed at 10/23/2017 0600 Gross per 24 hour  Intake 2849.6 ml  Output 0 ml  Net 2849.6 ml   Physical Exam:  Blood pressure 129/63, pulse 67, temperature 98.5 F (36.9 C), temperature source Oral, resp. rate (!) 21, height '5\' 9"'  (1.753 m), weight 95 kg (209 lb 7 oz), SpO2 92 %.   Looks best I have seen, alert  R IJ temp HD cath (3/3) CVP 7 Trach/vent/scd's (foley out, bladder scan sludge) Ant clear Tachy S1S2 NoS3 Obese abdomen, + BS still tight SCD's in place Wrinkling skin of feet Pitting edema LE's better but still present  Weight trending 3/15  95 kg         10/18/17 107.3 kg   10/17/17 107.7 kg Changed to keep even  10/16/17 110.4 kg   10/15/17  114 kg   10/15/15 119.8 k   10/13/17 122.5 kg   10/12/17  130.8 kg  CRRT started  10/11/17  127.6 kg  10/10/17  126.1      Recent Labs  Lab 10/19/17 0402 10/19/17 1640 10/20/17 0407 10/20/17 1531 10/21/17 0313 10/22/17 0524 10/23/17 0402  NA 133* 134* 133* 137 133* 132* 131*  K 3.9 4.2 4.3 3.5 5.0 4.5 5.5*  CL 97* 101 99* 108 98* 95* 94*  CO2 '24 24 25 ' 19* '22 25 23  ' GLUCOSE 124* 137* 115* 126* 140* 129* 107*  BUN 50* 44* 45* 46* 89* 79* 119*  CREATININE 2.01* 1.78* 1.99* 1.89* 3.52* 3.54* 5.27*  CALCIUM 8.5* 8.0* 8.5* 6.5* 8.8* 8.5* 8.5*  PHOS 4.7* 4.2 3.7 2.8 5.9* 7.3* 10.7*    Recent Labs  Lab 10/21/17 0313 10/22/17 0524 10/23/17 0402  ALBUMIN 1.9* 2.0* 2.1*    Recent Labs  Lab 10/20/17 0407 10/21/17 0313 10/22/17 0524 10/23/17 0402  WBC 17.0* 18.5* 11.6* 11.8*  HGB 8.0* 7.6* 7.7* 7.3*  HCT 27.3* 25.5* 25.5* 23.6*  MCV 96.8 97.7 99.2 96.7  PLT 228 239 213 259     Recent Labs  Lab 10/22/17 1146  10/22/17 1630 10/22/17 2027 10/22/17 2358 10/23/17 0323  GLUCAP 114* 109* 89 114* 108*    Studies/Results: Dg Chest Portable 1 View  Result Date: 10/16/2017 CLINICAL DATA:  Respiratory failure EXAM: PORTABLE CHEST 1 VIEW COMPARISON:  10/14/2017 FINDINGS: Tracheostomy in good position. Bilateral central venous catheter tips in the SVC unchanged. Feeding tube in place with the tip not visualized. Cardiac enlargement with mild vascular congestion which has improved in the interval. Improvement in bibasilar airspace disease. No pleural effusion IMPRESSION: Improvement in bilateral airspace disease. Support lines remain in good position. Electronically Signed   By: Franchot Gallo M.D.   On: 10/16/2017   Dg Chest Port 1 View  Result Date: 10/17/2017 CLINICAL DATA:  Respiratory failure. EXAM: PORTABLE CHEST 1 VIEW COMPARISON:  10/16/2017 FINDINGS: Tracheostomy tube overlies the airway. Bilateral jugular catheters terminate over the upper SVC. Feeding tube courses into the left upper abdomen with tip not imaged. The cardiac silhouette remains enlarged. Mild diffuse prominence of the interstitial markings is unchanged and may be at least in part chronic and secondary to known emphysema. Mild patchy basilar opacities are minimally increased on the right and unchanged on the left. No overt edema, sizable pleural effusion, or pneumothorax is identified. IMPRESSION: Mild bibasilar opacities, minimally increased on the right and which may reflect atelectasis. Electronically Signed   By: Logan Bores M.D.   On: 10/17/2017 07:41   Medications: . sodium chloride 250 mL (10/22/17 1800)  . sodium chloride Stopped (10/19/17 2000)  . sodium chloride    . sodium chloride    . sodium chloride    . sodium chloride    . dexmedetomidine (PRECEDEX) IV infusion 0.7 mcg/kg/hr (10/23/17 0358)  . heparin 2,600 Units/hr (10/23/17 5374)   Medications . amiodarone  200 mg Per Tube Daily  . chlorhexidine gluconate (MEDLINE  KIT)  15 mL Mouth Rinse BID  . Chlorhexidine Gluconate Cloth  6 each Topical Daily  . clonazePAM  0.5 mg Per Tube QHS  . diltiazem  60 mg Per Tube Q6H  . docusate  100 mg Per Tube BID  . dolutegravir  50 mg Oral Daily  . [START ON 10/24/2017] emtricitabine  200 mg Oral Once every 4 days  . famotidine  20 mg Per Tube QHS  . feeding supplement (VITAL HIGH PROTEIN)  1,000 mL Per Tube Q24H  . insulin aspart  0-20 Units Subcutaneous Q4H  . insulin aspart  5 Units Subcutaneous  Q4H  . insulin glargine  35 Units Subcutaneous BID  . ipratropium-albuterol  3 mL Nebulization Q6H  . mouth rinse  15 mL Mouth Rinse QID  . midodrine  10 mg Per Tube TID WC  . polyethylene glycol  17 g Oral Daily  . sennosides  5 mL Per Tube QHS  . sodium chloride flush  10-40 mL Intracatheter Q12H  . sodium chloride flush  3 mL Intravenous Q12H  . tenofovir  300 mg Oral Q Wed

## 2017-10-23 NOTE — Progress Notes (Signed)
ANTICOAGULATION CONSULT NOTE - Follow Up Consult  Pharmacy Consult:  Heparin Indication: pulmonary embolus/Afib  Allergies  Allergen Reactions  . Bactrim [Sulfamethoxazole-Trimethoprim] Hives    Patient Measurements: Height: 5\' 9"  (175.3 cm) Weight: 205 lb 0.4 oz (93 kg) IBW/kg (Calculated) : 70.7 Heparin dosing weight: 97 kg  Vital Signs: Temp: 98.4 F (36.9 C) (03/15 1131) Temp Source: Oral (03/15 1131) BP: 173/79 (03/15 1234) Pulse Rate: 87 (03/15 1234)  Labs: Recent Labs    10/21/17 0313 10/22/17 0524 10/22/17 1653 10/23/17 0402 10/23/17 1245  HGB 7.6* 7.7*  --  7.3*  --   HCT 25.5* 25.5*  --  23.6*  --   PLT 239 213  --  259  --   LABPROT 30.7* 22.0*  --  17.8*  --   INR 2.97 1.94  --  1.48  --   HEPARINUNFRC  --   --  0.46 0.81* 0.90*  CREATININE 3.52* 3.54*  --  5.27*  --     Estimated Creatinine Clearance: 18.7 mL/min (A) (by C-G formula based on SCr of 5.27 mg/dL (H)).    Assessment: [age] YOM with Afib on Eliquis PTA, transitioned to warfarin given possible PE on Eliquis (heparin bridge complete). Patient now off CRRT transitioning to HD - requires tunneled HD catheter placement by IR, which requires INR < 1.5 per IR notes.   INR has trended down to 1.48 today. Currently on a IV heparin bridge for PE. HL has trended up to 0.9 despite rate decrease earlier. Hemoglobin is 7.3. Platelets are stable.  Goal of Therapy:  INR 2-3  Heparin level 0.3-0.7 units/mL Monitor platelets by anticoagulation protocol: Yes    Plan: Dec heparin to 2400 units/hr F/u 2300 HL Monitor daily HL, CBC and s/s of bleeding  Vinnie LevelBenjamin Eshawn Coor, PharmD., BCPS Clinical Pharmacist Clinical phone for 10/23/17 until 3:30pm: [phone] If after 3:30pm, please call main pharmacy at: [phone]

## 2017-10-23 NOTE — Progress Notes (Signed)
RT assisted SP with PMV speaking trial on vent.  Pt tolerated the valve well for 25 minutes.  After trial, pt placed back in wean mode through the vent.

## 2017-10-23 NOTE — Procedures (Signed)
Patient was seen on dialysis and the procedure was supervised.  BFR 350  Via vascath BP is  148/78.   Patient appears to be tolerating treatment well  Chris [name] A 10/23/2017

## 2017-10-23 NOTE — Progress Notes (Signed)
CPT not done at this time - pt on CRRT.

## 2017-10-23 NOTE — Progress Notes (Signed)
RT spoke to CCM concerning if CPT was needed and if trach needed to be changed today (14 days).  Per Dr, Delton CoombesByrum, ok to leave trach as is as long as we can pass a suction catheter and change the inner cannula.  Ok to D/C CPT if secretions were able to be cleared.

## 2017-10-23 NOTE — Progress Notes (Signed)
ANTICOAGULATION CONSULT NOTE - Follow Up Consult  Pharmacy Consult:  Heparin Indication: pulmonary embolus/Afib  Allergies  Allergen Reactions  . Bactrim [Sulfamethoxazole-Trimethoprim] Hives    Patient Measurements: Height: 5\' 9"  (175.3 cm) Weight: 209 lb 7 oz (95 kg) IBW/kg (Calculated) : 70.7 Heparin dosing weight: 97 kg  Vital Signs: Temp: 98.5 F (36.9 C) (03/15 0327) Temp Source: Oral (03/15 0327) BP: 137/76 (03/15 0400) Pulse Rate: 69 (03/15 0445)  Labs: Recent Labs    10/21/17 0313 10/22/17 0524 10/22/17 1653 10/23/17 0402  HGB 7.6* 7.7*  --  7.3*  HCT 25.5* 25.5*  --  23.6*  PLT 239 213  --  259  LABPROT 30.7* 22.0*  --  17.8*  INR 2.97 1.94  --  1.48  HEPARINUNFRC  --   --  0.46 0.81*  CREATININE 3.52* 3.54*  --  5.27*    Estimated Creatinine Clearance: 18.9 mL/min (A) (by C-G formula based on SCr of 5.27 mg/dL (H)).    Assessment: [age] YOM with Afib on Eliquis PTA, transitioned to warfarin given possible PE on Eliquis (heparin bridge complete). Patient now off CRRT transitioning to HD - requires tunneled HD catheter placement by IR, which requires INR < 1.5 per IR notes.   Today INR is trending down to 1.9. Given PE this admission, will begin heparin bridge until IR deems patient appropriate for tunneled catheter placement. Hemoglobin is 7.7 s/p 1 unit PRBC in HD yesterday. Platelets are stable/at baseline.  3/15 AM: heparin level elevated this AM, no issues per RN.  Goal of Therapy:  INR 2-3  Heparin level 0.3-0.7 units/mL Monitor platelets by anticoagulation protocol: Yes    Plan: Dec heparin to 2600 units/hr 1300 HL  Abran DukeJames Murphy Bundick, PharmD, BCPS Clinical Pharmacist Phone: [phone]

## 2017-10-23 NOTE — Progress Notes (Signed)
Pt placed back on vent wean due to anxiety on ATC.  Pt states he feels like he can't breath on aerosol trach collar.  RT assurred pt his HR, RR, and oxygen sats were good.  Pt continued to request to go back on vent.  Rt placed pt on vent wean Cpap 5 PS 5.  RN notified.

## 2017-10-23 NOTE — Progress Notes (Signed)
SLP Cancellation Note  Patient Details Name: Ruel FavorsJerome A Shoemaker [name] MRN: [id number] DOB: 02/04/1966   Cancelled treatment:       Reason Eval/Treat Not Completed: Patient at procedure or test/unavailable. Pt getting dialysis this morning. Discussed with RT possible PMV placement this afternoon (inline or via TC as per RT they may try TC again this afternoon). Per RN, pt also pending procedure this afternoon. Will f/u later today as able.   [name], Kaitland Lewellyn 10/23/2017, 9:42 AM  Maxcine HamLaura Paiewonsky, M.A. CCC-SLP [phone]

## 2017-10-23 NOTE — Progress Notes (Signed)
Pt and respiratory were ready to go to IR. No transport at the bedside. IR called. It was stated that pt will not have HD placed today, and will do it tomorrow.

## 2017-10-23 NOTE — Progress Notes (Signed)
Patient with a large emesis of yellow fluid, RN aware.  FiO2 adjusted for sats of 89%.

## 2017-10-23 NOTE — Progress Notes (Signed)
[place].  WKG:[id number] DOB: 1965/12/21 DOA: 10/01/2017 PCP: System, Pcp Not In    Brief Narrative:  52 yo male w/ a Hx of HIV, A fib, COPD, and DM who presented with dyspnea, cough, fever, and hypoxia and was found to have Influenza PNA causing ARDS.  His hospital course has been complicated by hypervolemia and renal failure.   Significant Events: 2/21 Admit 2/22 Intubated 2/27 ID consulted for persistent fever 2/28 Renal consult for AKI, Cardiology consult for A fib with RVR 3/01 Trach 3/03 R IJ HD cath - attempted HD 3/04 Changed to CRRT 3/05 Worsening hypoxia >> vent settings adjusted 3/12 CRRT stopped  3/13 first iHD; PSV x 8 hrs  Subjective: Resting comfortably in bed on HD.  No evidence of resp distress or uncontrolled pain.  Follows some simple commands.  Unable to speak on vent.    Assessment & Plan:  Acute hypoxic and hypercapnic respiratory failure - Influenza PNA - ARDS Remains trach/vent dependent - PCCM managing weaning   COPD with emphysema No acute exacerbation at this time   Purulent tracheobronchitis  Has completed a course of abx tx  A fib with RVR Continue amiodarone, diltiazem - holding coumadin in anticpation of permcath   Acute on chronic diastolic CHF  Volume management per HD  Hypotension  Resolved w/ pt presently hypertensive   Acute renal failure - ATN Nephrology following - HD now intermittent - normal crt at baseline (0.8) - CRRT stopped 3/12, now on IHD - to have vascath removed and permacath placed in IR   Anemia of critical illness and chronic disease Follow Hgb trend   Esophageal candidiasis Noted at time of intubation - has completed a tx course   HIV Continue Emtriva, Tivicay, tenofovir  DM CBG currently controlled  Acute metabolic encephalopathy Appears to be improving - follow - wean sedatives as able - uremia likely contributing   DVT prophylaxis: SCDs Code Status:  FULL CODE Family Communication: no family present at time of exam  Disposition Plan: SDU  Consultants:  PCCM Nephrology   Antimicrobials:  Vancomycin 2/21 >2/22 Cefepime 2/21 > 2/28 Tamiflu 2/21 > 2/26 Fluconazole 2/21 > 2/27 Anidulafungin 2/27 >3/6 Zithromax 2/27 > 3/1  Objective: Blood pressure (!) 174/79, pulse 80, temperature 98.2 F (36.8 C), temperature source Oral, resp. rate (!) 28, height '5\' 9"'  (1.753 m), weight 95 kg (209 lb 7 oz), SpO2 95 %.  Intake/Output Summary (Last 24 hours) at 10/23/2017 0916 Last data filed at 10/23/2017 0900 Gross per 24 hour  Intake 3308.15 ml  Output 0 ml  Net 3308.15 ml   Filed Weights   10/22/17 0500 10/23/17 0401 10/23/17 0724  Weight: 105.5 kg (232 lb 9.4 oz) 95 kg (209 lb 7 oz) 95 kg (209 lb 7 oz)    Examination: General: No acute respiratory distress Lungs: Clear to auscultation bilaterally without wheezes or crackles Cardiovascular: Regular rate and rhythm without murmur gallop or rub normal S1 and S2 Abdomen: Nontender, nondistended, soft, bowel sounds positive, no rebound, no ascites, no appreciable mass Extremities: 1+ B LE edema   CBC: Recent Labs  Lab 10/19/17 0402 10/20/17 0407 10/21/17 0313 10/22/17 0524 10/23/17 0402  WBC 16.8* 17.0* 18.5* 11.6* 11.8*  HGB 8.2* 8.0* 7.6* 7.7* 7.3*  HCT 27.2* 27.3* 25.5* 25.5* 23.6*  MCV 94.4 96.8 97.7 99.2 96.7  PLT 213 228 239 213 458   Basic Metabolic Panel: Recent Labs  Lab 10/20/17 0407  10/20/17 1531 10/21/17 0313 10/22/17 0524 10/23/17 0402  NA 133* 137 133* 132* 131*  K 4.3 3.5 5.0 4.5 5.5*  CL 99* 108 98* 95* 94*  CO2 25 19* '22 25 23  ' GLUCOSE 115* 126* 140* 129* 107*  BUN 45* 46* 89* 79* 119*  CREATININE 1.99* 1.89* 3.52* 3.54* 5.27*  CALCIUM 8.5* 6.5* 8.8* 8.5* 8.5*  PHOS 3.7 2.8 5.9* 7.3* 10.7*   GFR: Estimated Creatinine Clearance: 18.9 mL/min (A) (by C-G formula based on SCr of 5.27 mg/dL (H)).  Liver Function Tests: Recent Labs  Lab  10/20/17 0407 10/20/17 1531 10/21/17 0313 10/22/17 0524 10/23/17 0402  ALBUMIN 2.0* 1.5* 1.9* 2.0* 2.1*    Coagulation Profile: Recent Labs  Lab 10/19/17 0402 10/20/17 0407 10/21/17 0313 10/22/17 0524 10/23/17 0402  INR 2.76 3.15 2.97 1.94 1.48    HbA1C: Hemoglobin A1C  Date/Time Value Ref Range Status  09/02/2017 09:33 AM 10.5  Final  06/01/2017 10:48 AM 8.8  Final   Hgb A1c MFr Bld  Date/Time Value Ref Range Status  01/03/2016 12:23 PM 8.5 (H) <5.7 % Final    Comment:      For someone without known diabetes, a hemoglobin A1c value of 6.5% or greater indicates that they may have diabetes and this should be confirmed with a follow-up test.   For someone with known diabetes, a value <7% indicates that their diabetes is well controlled and a value greater than or equal to 7% indicates suboptimal control. A1c targets should be individualized based on duration of diabetes, age, comorbid conditions, and other considerations.   Currently, no consensus exists for use of hemoglobin A1c for diagnosis of diabetes for children.     12/07/2014 06:10 AM 6.0 (H) 4.8 - 5.6 % Final    Comment:    (NOTE)         Pre-diabetes: 5.7 - 6.4         Diabetes: >6.4         Glycemic control for adults with diabetes: <7.0     CBG: Recent Labs  Lab 10/22/17 1630 10/22/17 2027 10/22/17 2358 10/23/17 0323 10/23/17 0750  GLUCAP 109* 89 114* 108* 93    Recent Results (from the past 240 hour(s))  Culture, respiratory (NON-Expectorated)     Status: None   Collection Time: 10/13/17 10:06 AM  Result Value Ref Range Status   Specimen Description TRACHEAL ASPIRATE  Final   Special Requests NONE  Final   Gram Stain   Final    RARE WBC PRESENT, PREDOMINANTLY PMN RARE GRAM POSITIVE COCCI RARE GRAM VARIABLE ROD    Culture   Final    Consistent with normal respiratory flora. Performed at [facility], [place] [address], [place], [place] [address]    Report Status 10/16/2017  FINAL  Final  Culture, blood (Routine X 2) w Reflex to ID Panel     Status: None   Collection Time: 10/14/17  9:18 AM  Result Value Ref Range Status   Specimen Description BLOOD WRIST LEFT  Final   Special Requests   Final    BOTTLES DRAWN AEROBIC ONLY Blood Culture adequate volume   Culture   Final    NO GROWTH 5 DAYS Performed at [facility], [place] [address], [place], [place] [address]    Report Status 10/19/2017 FINAL  Final  Culture, blood (Routine X 2) w Reflex to ID Panel     Status: None   Collection Time: 10/14/17  9:19 AM  Result Value Ref Range Status   Specimen Description BLOOD LEFT HAND  Final   Special Requests IN PEDIATRIC BOTTLE Blood Culture adequate volume  Final   Culture   Final    NO GROWTH 5 DAYS Performed at [facility], [address] [place], [place] [address]    Report Status 10/19/2017 FINAL  Final     Scheduled Meds: . amiodarone  200 mg Per Tube Daily  . chlorhexidine gluconate (MEDLINE KIT)  15 mL Mouth Rinse BID  . Chlorhexidine Gluconate Cloth  6 each Topical Daily  . clonazePAM  0.5 mg Per Tube QHS  . diltiazem  60 mg Per Tube Q6H  . docusate  100 mg Per Tube BID  . dolutegravir  50 mg Oral Daily  . [START ON 10/24/2017] emtricitabine  200 mg Oral Once every 4 days  . famotidine  20 mg Per Tube QHS  . feeding supplement (VITAL HIGH PROTEIN)  1,000 mL Per Tube Q24H  . insulin aspart  0-20 Units Subcutaneous Q4H  . insulin aspart  5 Units Subcutaneous Q4H  . insulin glargine  35 Units Subcutaneous BID  . ipratropium-albuterol  3 mL Nebulization Q6H  . mouth rinse  15 mL Mouth Rinse QID  . midodrine  10 mg Per Tube TID WC  . polyethylene glycol  17 g Oral Daily  . sennosides  5 mL Per Tube QHS  . sodium chloride flush  10-40 mL Intracatheter Q12H  . sodium chloride flush  3 mL Intravenous Q12H  . tenofovir  300 mg Oral Q Wed     LOS: 22 days   Cherene Altes, MD [facility] Office  [phone] Pager - Text  Page per Shea Evans as per below:  On-Call/Text Page:      [email or web address]      password [email or web address]  If 7PM-7AM, please contact night-coverage [email or web address] Password [facility] 10/23/2017, 9:16 AM

## 2017-10-23 NOTE — Progress Notes (Signed)
Tube feeds held per request of Baird Lyonsasey, NP with Radiology in hopes of proceeding with perm cath placement this afternoon.

## 2017-10-24 ENCOUNTER — Encounter (HOSPITAL_COMMUNITY): Payer: Self-pay | Admitting: Interventional Radiology

## 2017-10-24 ENCOUNTER — Inpatient Hospital Stay (HOSPITAL_COMMUNITY): Payer: Medicare HMO

## 2017-10-24 HISTORY — PX: IR US GUIDE VASC ACCESS RIGHT: IMG2390

## 2017-10-24 HISTORY — PX: IR FLUORO GUIDE CV LINE RIGHT: IMG2283

## 2017-10-24 LAB — GLUCOSE, CAPILLARY
GLUCOSE-CAPILLARY: 90 mg/dL (ref 65–99)
GLUCOSE-CAPILLARY: 92 mg/dL (ref 65–99)
Glucose-Capillary: 51 mg/dL — ABNORMAL LOW (ref 65–99)
Glucose-Capillary: 80 mg/dL (ref 65–99)
Glucose-Capillary: 53 mg/dL — ABNORMAL LOW (ref 65–99)
Glucose-Capillary: 104 mg/dL — ABNORMAL HIGH (ref 65–99)
Glucose-Capillary: 70 mg/dL (ref 65–99)
Glucose-Capillary: 118 mg/dL — ABNORMAL HIGH (ref 65–99)

## 2017-10-24 LAB — TYPE AND SCREEN
ABO/RH(D): A POS
Antibody Screen: NEGATIVE
Unit division: 0
Unit division: 0

## 2017-10-24 LAB — CBC
HCT: 26.8 % — ABNORMAL LOW (ref 39.0–52.0)
Hemoglobin: 8.4 g/dL — ABNORMAL LOW (ref 13.0–17.0)
MCH: 30.2 pg (ref 26.0–34.0)
MCHC: 31.3 g/dL (ref 30.0–36.0)
MCV: 96.4 fL (ref 78.0–100.0)
Platelets: 325 10*3/uL (ref 150–400)
RBC: 2.78 MIL/uL — ABNORMAL LOW (ref 4.22–5.81)
RDW: 18.6 % — AB (ref 11.5–15.5)
WBC: 11.5 10*3/uL — ABNORMAL HIGH (ref 4.0–10.5)

## 2017-10-24 LAB — BPAM RBC
BLOOD PRODUCT EXPIRATION DATE: 201903192359
BLOOD PRODUCT EXPIRATION DATE: 201903302359
ISSUE DATE / TIME: 201903131549
ISSUE DATE / TIME: 201903150850
UNIT TYPE AND RH: 600
Unit Type and Rh: 600

## 2017-10-24 LAB — RENAL FUNCTION PANEL
Albumin: 2.1 g/dL — ABNORMAL LOW (ref 3.5–5.0)
Anion gap: 12 (ref 5–15)
BUN: 58 mg/dL — ABNORMAL HIGH (ref 6–20)
CALCIUM: 8.6 mg/dL — AB (ref 8.9–10.3)
CHLORIDE: 94 mmol/L — AB (ref 101–111)
CO2: 25 mmol/L (ref 22–32)
CREATININE: 4.04 mg/dL — AB (ref 0.61–1.24)
GFR, EST AFRICAN AMERICAN: 18 mL/min — AB (ref >60)
GFR, EST NON AFRICAN AMERICAN: 16 mL/min — AB (ref >60)
Glucose, Bld: 60 mg/dL — ABNORMAL LOW (ref 65–99)
Phosphorus: 6.7 mg/dL — ABNORMAL HIGH (ref 2.5–4.6)
Potassium: 4.2 mmol/L (ref 3.5–5.1)
Sodium: 131 mmol/L — ABNORMAL LOW (ref 135–145)

## 2017-10-24 LAB — PROTIME-INR
INR: 1.29
PROTHROMBIN TIME: 15.9 s — AB (ref 11.4–15.2)

## 2017-10-24 LAB — HEPARIN LEVEL (UNFRACTIONATED)
HEPARIN UNFRACTIONATED: 0.6 [IU]/mL (ref 0.30–0.70)
Heparin Unfractionated: 0.45 IU/mL (ref 0.30–0.70)

## 2017-10-24 MED ORDER — INSULIN GLARGINE 100 UNIT/ML ~~LOC~~ SOLN
20.0000 [IU] | Freq: Two times a day (BID) | SUBCUTANEOUS | Status: DC
  Administered 2017-10-24 – 2017-10-30 (×12): 20 [IU] via SUBCUTANEOUS
  Filled 2017-10-24 (×15): qty 0.2

## 2017-10-24 MED ORDER — FENTANYL CITRATE (PF) 100 MCG/2ML IJ SOLN
INTRAMUSCULAR | Status: AC
  Filled 2017-10-24: qty 2

## 2017-10-24 MED ORDER — WARFARIN - PHARMACIST DOSING INPATIENT
Freq: Every day | Status: DC
  Administered 2017-10-24 – 2017-10-29 (×5)

## 2017-10-24 MED ORDER — GELATIN ABSORBABLE 12-7 MM EX MISC
CUTANEOUS | Status: AC
  Filled 2017-10-24: qty 1

## 2017-10-24 MED ORDER — MIDAZOLAM HCL 2 MG/2ML IJ SOLN
INTRAMUSCULAR | Status: AC | PRN
  Administered 2017-10-24: 1 mg via INTRAVENOUS

## 2017-10-24 MED ORDER — DEXTROSE 50 % IV SOLN
25.0000 mL | Freq: Once | INTRAVENOUS | Status: AC
  Administered 2017-10-24: 25 mL via INTRAVENOUS

## 2017-10-24 MED ORDER — LIDOCAINE HCL 1 % IJ SOLN
INTRAMUSCULAR | Status: AC
  Filled 2017-10-24: qty 20

## 2017-10-24 MED ORDER — HEPARIN SODIUM (PORCINE) 1000 UNIT/ML DIALYSIS: 20.0000 [IU]/kg | INTRAMUSCULAR | Status: DC | PRN

## 2017-10-24 MED ORDER — DEXTROSE 10 % IV SOLN
INTRAVENOUS | Status: DC
  Administered 2017-10-24 – 2017-10-26 (×4): via INTRAVENOUS
  Administered 2017-10-27: 25 mL/h via INTRAVENOUS

## 2017-10-24 MED ORDER — MIDAZOLAM HCL 2 MG/2ML IJ SOLN
INTRAMUSCULAR | Status: AC
  Filled 2017-10-24: qty 4

## 2017-10-24 MED ORDER — FENTANYL CITRATE (PF) 100 MCG/2ML IJ SOLN
INTRAMUSCULAR | Status: AC | PRN
  Administered 2017-10-24: 50 ug via INTRAVENOUS

## 2017-10-24 MED ORDER — CEFAZOLIN SODIUM-DEXTROSE 2-4 GM/100ML-% IV SOLN
INTRAVENOUS | Status: AC
  Administered 2017-10-24: 2000 mg
  Filled 2017-10-24: qty 100

## 2017-10-24 MED ORDER — WARFARIN SODIUM 7.5 MG PO TABS
15.0000 mg | ORAL_TABLET | Freq: Once | ORAL | Status: AC
  Administered 2017-10-24: 15 mg via ORAL
  Filled 2017-10-24: qty 2

## 2017-10-24 MED ORDER — LIDOCAINE HCL (PF) 1 % IJ SOLN
INTRAMUSCULAR | Status: AC | PRN
  Administered 2017-10-24: 10 mL

## 2017-10-24 MED ORDER — HEPARIN SODIUM (PORCINE) 1000 UNIT/ML IJ SOLN
INTRAMUSCULAR | Status: AC
  Filled 2017-10-24: qty 1

## 2017-10-24 MED ORDER — CHLORHEXIDINE GLUCONATE 4 % EX LIQD
CUTANEOUS | Status: AC
  Filled 2017-10-24: qty 15

## 2017-10-24 MED ORDER — DEXTROSE 50 % IV SOLN
INTRAVENOUS | Status: AC
  Administered 2017-10-24: 25 mL via INTRAVENOUS
  Filled 2017-10-24: qty 50

## 2017-10-24 NOTE — Progress Notes (Signed)
[facility] Rounding Note  Background: 52 yo AAM HIV AF COPD MO Admitted w/resp failure, VDRF. + Flu [name]/PNA; possible PE;  Baseline creatinine 0.79 10/01/17. AKI in setting of above issues.  CRRT initiated 10/12/17 for massive anasarca /hyperkalemia. R IJ temp cath 3/3 by CCM.   Assessment/Recommendations 1. AKI  - normal creatinine at baseline (0.79). Dense ATN. Markedly catabolic. CRRT  3/4-3/12. Weight down  CVP 7-11 - still no UOP.  However would still be optimistic for eventual renal recovery given normal kidney function at baseline - CRTT stopped 3/12-   IHD 3/13 and 3/15   vascath is now 74 days old  transition of vascath to [facility] per IR today. HD next planned for 3/18 2. Hyperkalemia - necessitated use of all 0 K fluids for [name] while-  better  3. HTN/vol- - BP now much better- holding midodrine- UF with HD and cardizem 4. VDRF/COPD/ARDS/FluA+ - finished Tamiflu/ATB's; on steroids - weaning. Per CCM- still with relatively high O2 need  5. HIV - meds; ID following 6. Esophageal candidiasis - noted during intubation. IV antifungals done  7. Fevers/leukocytosis. CCM/ID addressing. Vanco and zosyn for possible PNA- now off 8. AF/RVR - amio/heparin/low dose dilt 9. Anemia- added ESA- gave darbe 100 3/12- hgb down to 7.6-  transfuse with HD one unit on 3/13- did not make much difference- gave another unit 3/15- no follow up CBC 10. Hyperphos- will need binder when taking more regular food  Chris [name] 10/18/2017, 7:56 AM  Subjective/Interval History:  HD yest- removed 2000 No urine  Alert - told me he wasn't able to sleep - had N/V  Did not get permcath yesterday - planned for today   Objective Vital signs in last 24 hours: Vitals:   10/24/17 0344 10/24/17 0400 10/24/17 0500 10/24/17 0600  BP:  136/65 (!) 148/72 (!) 146/68  Pulse:  75 82 77  Resp:  19 19 (!) 23  Temp: 98.3 F (36.8 C)     TempSrc: Oral     SpO2:  95% 95% 91%  Weight:   98.2 kg (216 lb 7.9 oz)   Height:       Weight  change: 0 kg (0 lb)  Intake/Output Summary (Last 24 hours) at 10/24/2017 0734 Last data filed at 10/24/2017 0700 Gross per 24 hour  Intake 3054.46 ml  Output 2000 ml  Net 1054.46 ml   Physical Exam:  Blood pressure (!) 146/68, pulse 77, temperature 98.3 F (36.8 C), temperature source Oral, resp. rate (!) 23, height '5\' 9"'  (1.753 m), weight 98.2 kg (216 lb 7.9 oz), SpO2 91 %.   Looks best I have seen, alert  R IJ temp HD cath (3/3)- due to be changed by IR today Trach/vent/scd's  Ant clear Tachy S1S2 NoS3 Obese abdomen, + BS still tight SCD's in place Wrinkling skin of feet Pitting edema LE's better but still present  Weight trending 3/16 98.2 3/15  95 kg         10/18/17 107.3 kg   10/17/17 107.7 kg Changed to keep even  10/16/17 110.4 kg   10/15/17  114 kg   10/15/15 119.8 k   10/13/17 122.5 kg   10/12/17  130.8 kg  CRRT started  10/11/17  127.6 kg    10/10/17  126.1      Recent Labs  Lab 10/19/17 1640 10/20/17 0407 10/20/17 1531 10/21/17 0313 10/22/17 0524 10/23/17 0402 10/24/17 0509  NA 134* 133* 137 133* 132* 131* 131*  K 4.2 4.3 3.5  5.0 4.5 5.5* 4.2  CL 101 99* 108 98* 95* 94* 94*  CO2 24 25 19* '22 25 23 25  ' GLUCOSE 137* 115* 126* 140* 129* 107* 60*  BUN 44* 45* 46* 89* 79* 119* 58*  CREATININE 1.78* 1.99* 1.89* 3.52* 3.54* 5.27* 4.04*  CALCIUM 8.0* 8.5* 6.5* 8.8* 8.5* 8.5* 8.6*  PHOS 4.2 3.7 2.8 5.9* 7.3* 10.7* 6.7*    Recent Labs  Lab 10/22/17 0524 10/23/17 0402 10/24/17 0509  ALBUMIN 2.0* 2.1* 2.1*    Recent Labs  Lab 10/20/17 0407 10/21/17 0313 10/22/17 0524 10/23/17 0402  WBC 17.0* 18.5* 11.6* 11.8*  HGB 8.0* 7.6* 7.7* 7.3*  HCT 27.3* 25.5* 25.5* 23.6*  MCV 96.8 97.7 99.2 96.7  PLT 228 239 213 259     Recent Labs  Lab 10/23/17 1952 10/23/17 2349 10/24/17 0017 10/24/17 0341 10/24/17 0414  GLUCAP 98 50* 92 51* 80    Studies/Results: Dg Chest Portable 1 View  Result Date: 10/16/2017 CLINICAL DATA:  Respiratory failure EXAM:  PORTABLE CHEST 1 VIEW COMPARISON:  10/14/2017 FINDINGS: Tracheostomy in good position. Bilateral central venous catheter tips in the SVC unchanged. Feeding tube in place with the tip not visualized. Cardiac enlargement with mild vascular congestion which has improved in the interval. Improvement in bibasilar airspace disease. No pleural effusion IMPRESSION: Improvement in bilateral airspace disease. Support lines remain in good position. Electronically Signed   By: Franchot Gallo M.D.   On: 10/16/2017   Dg Chest Port 1 View  Result Date: 10/17/2017 CLINICAL DATA:  Respiratory failure. EXAM: PORTABLE CHEST 1 VIEW COMPARISON:  10/16/2017 FINDINGS: Tracheostomy tube overlies the airway. Bilateral jugular catheters terminate over the upper SVC. Feeding tube courses into the left upper abdomen with tip not imaged. The cardiac silhouette remains enlarged. Mild diffuse prominence of the interstitial markings is unchanged and may be at least in part chronic and secondary to known emphysema. Mild patchy basilar opacities are minimally increased on the right and unchanged on the left. No overt edema, sizable pleural effusion, or pneumothorax is identified. IMPRESSION: Mild bibasilar opacities, minimally increased on the right and which may reflect atelectasis. Electronically Signed   By: Logan Bores M.D.   On: 10/17/2017 07:41   Medications: . sodium chloride Stopped (10/24/17 0508)  . dexmedetomidine (PRECEDEX) IV infusion Stopped (10/23/17 1300)  . dextrose 25 mL/hr at 10/24/17 0540  . heparin Stopped (10/24/17 0615)   Medications . amiodarone  200 mg Per Tube Daily  . chlorhexidine gluconate (MEDLINE KIT)  15 mL Mouth Rinse BID  . Chlorhexidine Gluconate Cloth  6 each Topical Daily  . clonazePAM  0.5 mg Per Tube QHS  . diltiazem  60 mg Per Tube Q6H  . docusate  100 mg Per Tube BID  . dolutegravir  50 mg Oral Daily  . emtricitabine  200 mg Oral Once every 4 days  . famotidine  20 mg Per Tube QHS  .  feeding supplement (VITAL HIGH PROTEIN)  1,000 mL Per Tube Q24H  . insulin aspart  0-20 Units Subcutaneous Q4H  . insulin aspart  5 Units Subcutaneous Q4H  . insulin glargine  35 Units Subcutaneous BID  . ipratropium-albuterol  3 mL Nebulization TID  . mouth rinse  15 mL Mouth Rinse QID  . polyethylene glycol  17 g Oral Daily  . sennosides  5 mL Per Tube QHS  . sodium chloride flush  10-40 mL Intracatheter Q12H  . sodium chloride flush  3 mL Intravenous Q12H  . tenofovir  300 mg Oral Q Wed

## 2017-10-24 NOTE — Progress Notes (Signed)
Pt transported to IR via vent with no complications noted.

## 2017-10-24 NOTE — Procedures (Signed)
ESRD  S/P RT IJ TUNNELED HD CATH  Tip svcra No comp Stable EBL 5cc Ready for use Full report in pacs

## 2017-10-24 NOTE — Progress Notes (Signed)
ANTICOAGULATION CONSULT NOTE - Follow Up Consult  Pharmacy Consult:  Heparin Indication: pulmonary embolus/Afib  Allergies  Allergen Reactions  . Bactrim [Sulfamethoxazole-Trimethoprim] Hives    Patient Measurements: Height: 5\' 9"  (175.3 cm) Weight: 205 lb 0.4 oz (93 kg) IBW/kg (Calculated) : 70.7 Heparin dosing weight: 97 kg  Vital Signs: Temp: 99.4 F (37.4 C) (03/15 2351) Temp Source: Oral (03/15 2351) BP: 149/71 (03/16 0000) Pulse Rate: 85 (03/16 0040)  Labs: Recent Labs    10/21/17 0313 10/22/17 0524  10/23/17 0402 10/23/17 1245 10/24/17 0051  HGB 7.6* 7.7*  --  7.3*  --   --   HCT 25.5* 25.5*  --  23.6*  --   --   PLT 239 213  --  259  --   --   LABPROT 30.7* 22.0*  --  17.8*  --   --   INR 2.97 1.94  --  1.48  --   --   HEPARINUNFRC  --   --    < > 0.81* 0.90* 0.45  CREATININE 3.52* 3.54*  --  5.27*  --   --    < > = values in this interval not displayed.    Estimated Creatinine Clearance: 18.7 mL/min (A) (by C-G formula based on SCr of 5.27 mg/dL (H)).    Assessment: [age] YOM with Afib on Eliquis PTA, transitioned to warfarin given possible PE on Eliquis (heparin bridge complete). Patient now off CRRT transitioning to HD - requires tunneled HD catheter placement by IR, which requires INR < 1.5 per IR notes.   Today INR is trending down to 1.9. Given PE this admission, will begin heparin bridge until IR deems patient appropriate for tunneled catheter placement. Hemoglobin is 7.7 s/p 1 unit PRBC in HD yesterday. Platelets are stable/at baseline.  3/16 AM: heparin level therapeutic x 1 after rate decrease  Goal of Therapy:  INR 2-3  Heparin level 0.3-0.7 units/mL Monitor platelets by anticoagulation protocol: Yes    Plan: Cont heparin at 2400 units/hr Confirmatory heparin level with AM labs  Abran DukeJames Parmvir Boomer, PharmD, BCPS Clinical Pharmacist Phone: [phone]

## 2017-10-24 NOTE — Progress Notes (Signed)
Hypoglycemic Event  CBG: 51  Treatment: D50 IV 25 mL  Symptoms: None  Follow-up CBG: Time:0414 CBG Result:80   Possible Reasons for Event: Inadequate meal intake  Comments/MD notified: Stevie Kernharles Bodenheimer, NP notified. New orders received for D10 IVF, see MAR    Chapman Fitcharmichael, Neiko Trivedi Elizabeth

## 2017-10-24 NOTE — Progress Notes (Signed)
Pt returned from the procedure. C/O pain, VS stable. Bed in low position, alarms are on, call bell in reach. Tube feedings and Heparin restarted.

## 2017-10-24 NOTE — Progress Notes (Addendum)
[place].  HDQ:[id number] DOB: 1965/12/16 DOA: 10/01/2017 PCP: System, Pcp Not In    Brief Narrative:  52 yo male w/ a Hx of HIV, A fib, COPD, and DM who presented with dyspnea, cough, fever, and hypoxia and was found to have Influenza PNA causing ARDS.  His hospital course has been complicated by hypervolemia and renal failure.   Significant Events: 2/21 Admit 2/22 Intubated 2/27 ID consulted for persistent fever 2/28 Renal consult for AKI, Cardiology consult for A fib with RVR 3/01 Trach 3/03 R IJ HD cath - attempted HD 3/04 Changed to CRRT 3/05 Worsening hypoxia >> vent settings adjusted 3/12 CRRT stopped  3/13 first iHD; PSV x 8 hrs  Subjective: Pt has returned from having a tunneled cath placed in IR.  He appears comfortable.  He denies cp, n/v, or abdom pain.  He is in no acute resp distress.    Assessment & Plan:  Acute hypoxic and hypercapnic respiratory failure - Influenza PNA - ARDS Remains trach/vent dependent - PCCM managing weaning   COPD with emphysema No acute exacerbation at this time   Purulent tracheobronchitis  Has completed a course of abx tx - no evidence of ongoing infection   A fib with RVR Continue amiodarone, diltiazem - resume coumadin tngt s/p permcath - NSR at time of exam today - IV heparin for bridge   Acute on chronic diastolic CHF  Volume management per HD  Hypotension  Resolved - BP stable   Acute renal failure - ATN Nephrology following - HD now intermittent - normal crt at baseline (0.8) - CRRT stopped 3/12, now on IHD - vascath removed and permacath placed in IR today  Anemia of critical illness and chronic disease Hgb stable   Esophageal candidiasis Noted at time of intubation - has completed a tx course   HIV Continue Emtriva, Tivicay, tenofovir  DM Having some hypoglycemia - stopped Q4hr scheduled insulin and decrease lantus   Acute metabolic encephalopathy slowly  improving - follow - wean sedatives as able - uremia likely contributing   DVT prophylaxis: SCDs Code Status: FULL CODE Family Communication: no family present at time of exam  Disposition Plan: SDU  Consultants:  PCCM Nephrology   Antimicrobials:  Vancomycin 2/21 >2/22 Cefepime 2/21 > 2/28 Tamiflu 2/21 > 2/26 Fluconazole 2/21 > 2/27 Anidulafungin 2/27 >3/6 Zithromax 2/27 > 3/1  Objective: Blood pressure (!) 141/68, pulse 75, temperature 98.5 F (36.9 C), temperature source Oral, resp. rate (!) 23, height '5\' 9"'  (1.753 m), weight 98.2 kg (216 lb 7.9 oz), SpO2 95 %.  Intake/Output Summary (Last 24 hours) at 10/24/2017 0942 Last data filed at 10/24/2017 0700 Gross per 24 hour  Intake 2571.16 ml  Output 2000 ml  Net 571.16 ml   Filed Weights   10/23/17 0724 10/23/17 1124 10/24/17 0500  Weight: 95 kg (209 lb 7 oz) 93 kg (205 lb 0.4 oz) 98.2 kg (216 lb 7.9 oz)    Examination: General: No acute respiratory distress - alert  Lungs: Clear to auscultation bilaterally - no wheeze  Cardiovascular: Regular rate and rhythm without murmur Abdomen: Nontender, nondistended, soft, bowel sounds positive, no rebound Extremities: 2+ B LE edema   CBC: Recent Labs  Lab 10/20/17 0407 10/21/17 0313 10/22/17 0524 10/23/17 0402 10/24/17 0509  WBC 17.0* 18.5* 11.6* 11.8* 11.5*  HGB 8.0* 7.6* 7.7* 7.3* 8.4*  HCT 27.3* 25.5* 25.5* 23.6* 26.8*  MCV 96.8 97.7 99.2 96.7 96.4  PLT 228 239 213 259 034   Basic Metabolic Panel: Recent Labs  Lab 10/20/17 1531 10/21/17 0313 10/22/17 0524 10/23/17 0402 10/24/17 0509  NA 137 133* 132* 131* 131*  K 3.5 5.0 4.5 5.5* 4.2  CL 108 98* 95* 94* 94*  CO2 19* '22 25 23 25  ' GLUCOSE 126* 140* 129* 107* 60*  BUN 46* 89* 79* 119* 58*  CREATININE 1.89* 3.52* 3.54* 5.27* 4.04*  CALCIUM 6.5* 8.8* 8.5* 8.5* 8.6*  PHOS 2.8 5.9* 7.3* 10.7* 6.7*   GFR: Estimated Creatinine Clearance: 25 mL/min (A) (by C-G formula based on SCr of 4.04 mg/dL (H)).  Liver  Function Tests: Recent Labs  Lab 10/20/17 1531 10/21/17 0313 10/22/17 0524 10/23/17 0402 10/24/17 0509  ALBUMIN 1.5* 1.9* 2.0* 2.1* 2.1*    Coagulation Profile: Recent Labs  Lab 10/20/17 0407 10/21/17 0313 10/22/17 0524 10/23/17 0402 10/24/17 0509  INR 3.15 2.97 1.94 1.48 1.29    HbA1C: Hemoglobin A1C  Date/Time Value Ref Range Status  09/02/2017 09:33 AM 10.5  Final  06/01/2017 10:48 AM 8.8  Final   Hgb A1c MFr Bld  Date/Time Value Ref Range Status  01/03/2016 12:23 PM 8.5 (H) <5.7 % Final    Comment:      For someone without known diabetes, a hemoglobin A1c value of 6.5% or greater indicates that they may have diabetes and this should be confirmed with a follow-up test.   For someone with known diabetes, a value <7% indicates that their diabetes is well controlled and a value greater than or equal to 7% indicates suboptimal control. A1c targets should be individualized based on duration of diabetes, age, comorbid conditions, and other considerations.   Currently, no consensus exists for use of hemoglobin A1c for diagnosis of diabetes for children.     12/07/2014 06:10 AM 6.0 (H) 4.8 - 5.6 % Final    Comment:    (NOTE)         Pre-diabetes: 5.7 - 6.4         Diabetes: >6.4         Glycemic control for adults with diabetes: <7.0     CBG: Recent Labs  Lab 10/24/17 0017 10/24/17 0341 10/24/17 0414 10/24/17 0759 10/24/17 0845  GLUCAP 92 51* 80 53* 104*     Scheduled Meds: . amiodarone  200 mg Per Tube Daily  . chlorhexidine gluconate (MEDLINE KIT)  15 mL Mouth Rinse BID  . Chlorhexidine Gluconate Cloth  6 each Topical Daily  . clonazePAM  0.5 mg Per Tube QHS  . diltiazem  60 mg Per Tube Q6H  . docusate  100 mg Per Tube BID  . dolutegravir  50 mg Oral Daily  . emtricitabine  200 mg Oral Once every 4 days  . famotidine  20 mg Per Tube QHS  . feeding supplement (VITAL HIGH PROTEIN)  1,000 mL Per Tube Q24H  . insulin aspart  0-20 Units  Subcutaneous Q4H  . insulin glargine  20 Units Subcutaneous BID  . ipratropium-albuterol  3 mL Nebulization TID  . mouth rinse  15 mL Mouth Rinse QID  . polyethylene glycol  17 g Oral Daily  . sennosides  5 mL Per Tube QHS  . sodium chloride flush  10-40 mL Intracatheter Q12H  . sodium chloride flush  3 mL Intravenous Q12H  . tenofovir  300 mg Oral Q Wed     LOS: 23 days   Cherene Altes, MD [facility] Office  [phone] Pager - Text Page  per Amion as per below:  On-Call/Text Page:      [email or web address]      password [email or web address]  If 7PM-7AM, please contact night-coverage [email or web address] Password [facility] 10/24/2017, 9:42 AM

## 2017-10-24 NOTE — Progress Notes (Signed)
ANTICOAGULATION CONSULT NOTE - Follow Up Consult  Pharmacy Consult:  Heparin/warfarin Indication: pulmonary embolus/Afib  Allergies  Allergen Reactions  . Bactrim [Sulfamethoxazole-Trimethoprim] Hives    Patient Measurements: Height: 5\' 9"  (175.3 cm) Weight: 216 lb 7.9 oz (98.2 kg) IBW/kg (Calculated) : 70.7 Heparin dosing weight: 97 kg  Vital Signs: Temp: 98.6 F (37 C) (03/16 1154) Temp Source: Oral (03/16 1154) BP: 146/68 (03/16 1300) Pulse Rate: 74 (03/16 1300)  Labs: Recent Labs    10/22/17 0524  10/23/17 0402 10/23/17 1245 10/24/17 0051 10/24/17 0509  HGB 7.7*  --  7.3*  --   --  8.4*  HCT 25.5*  --  23.6*  --   --  26.8*  PLT 213  --  259  --   --  325  LABPROT 22.0*  --  17.8*  --   --  15.9*  INR 1.94  --  1.48  --   --  1.29  HEPARINUNFRC  --    < > 0.81* 0.90* 0.45 0.60  CREATININE 3.54*  --  5.27*  --   --  4.04*   < > = values in this interval not displayed.    Estimated Creatinine Clearance: 25 mL/min (A) (by C-G formula based on SCr of 4.04 mg/dL (H)).  Assessment: [age] YOM with Afib on Eliquis PTA, transitioned to warfarin given possible PE on Eliquis (heparin bridge complete). Patient now off CRRT transitioning to HD - requires tunneled HD catheter placement by IR, which requires INR < 1.5 per IR notes.   Patient s/p tunneled catheter placement 3/16. Md has consulted pharmacy to resume warfarin this evening. HgB 8.4 with AM labs. Platelets are stable/at baseline. Patient previously required higher than usual doses of warfarin to achieve therapeutic INR.   3/16 AM: heparin level therapeutic after rate decrease  Goal of Therapy:  INR 2-3  Heparin level 0.3-0.7 units/mL Monitor platelets by anticoagulation protocol: Yes    Plan: Warfarin 15mg  x1 tonight Continue heparin at 2400 units/hr until INR therpaeutic Daily heparin level/INR Monitor for s/sx of bleeding  Ruben Imony Keymora Grillot, PharmD Clinical Pharmacist 10/24/2017 1:47 PM

## 2017-10-25 DIAGNOSIS — G9341 Metabolic encephalopathy: Secondary | ICD-10-CM

## 2017-10-25 DIAGNOSIS — E118 Type 2 diabetes mellitus with unspecified complications: Secondary | ICD-10-CM

## 2017-10-25 DIAGNOSIS — I5033 Acute on chronic diastolic (congestive) heart failure: Secondary | ICD-10-CM

## 2017-10-25 DIAGNOSIS — J431 Panlobular emphysema: Secondary | ICD-10-CM

## 2017-10-25 DIAGNOSIS — E1165 Type 2 diabetes mellitus with hyperglycemia: Secondary | ICD-10-CM

## 2017-10-25 DIAGNOSIS — R1312 Dysphagia, oropharyngeal phase: Secondary | ICD-10-CM

## 2017-10-25 LAB — GLUCOSE, CAPILLARY
GLUCOSE-CAPILLARY: 122 mg/dL — AB (ref 65–99)
GLUCOSE-CAPILLARY: 120 mg/dL — AB (ref 65–99)
Glucose-Capillary: 102 mg/dL — ABNORMAL HIGH (ref 65–99)
Glucose-Capillary: 114 mg/dL — ABNORMAL HIGH (ref 65–99)
Glucose-Capillary: 116 mg/dL — ABNORMAL HIGH (ref 65–99)
Glucose-Capillary: 150 mg/dL — ABNORMAL HIGH (ref 65–99)

## 2017-10-25 LAB — CBC
HCT: 26.1 % — ABNORMAL LOW (ref 39.0–52.0)
Hemoglobin: 8.1 g/dL — ABNORMAL LOW (ref 13.0–17.0)
MCH: 29.1 pg (ref 26.0–34.0)
MCHC: 31 g/dL (ref 30.0–36.0)
MCV: 93.9 fL (ref 78.0–100.0)
PLATELETS: 321 10*3/uL (ref 150–400)
RBC: 2.78 MIL/uL — ABNORMAL LOW (ref 4.22–5.81)
RDW: 17.4 % — AB (ref 11.5–15.5)
WBC: 11.4 10*3/uL — ABNORMAL HIGH (ref 4.0–10.5)

## 2017-10-25 LAB — RENAL FUNCTION PANEL
ANION GAP: 13 (ref 5–15)
Albumin: 2.1 g/dL — ABNORMAL LOW (ref 3.5–5.0)
BUN: 81 mg/dL — ABNORMAL HIGH (ref 6–20)
CO2: 24 mmol/L (ref 22–32)
CREATININE: 5.6 mg/dL — AB (ref 0.61–1.24)
Calcium: 8.7 mg/dL — ABNORMAL LOW (ref 8.9–10.3)
Chloride: 94 mmol/L — ABNORMAL LOW (ref 101–111)
GFR, EST AFRICAN AMERICAN: 12 mL/min — AB (ref >60)
GFR, EST NON AFRICAN AMERICAN: 11 mL/min — AB (ref >60)
Glucose, Bld: 123 mg/dL — ABNORMAL HIGH (ref 65–99)
PHOSPHORUS: 9.1 mg/dL — AB (ref 2.5–4.6)
Potassium: 4.5 mmol/L (ref 3.5–5.1)
Sodium: 131 mmol/L — ABNORMAL LOW (ref 135–145)

## 2017-10-25 LAB — PROTIME-INR
INR: 1.35
PROTHROMBIN TIME: 16.6 s — AB (ref 11.4–15.2)

## 2017-10-25 LAB — HEPARIN LEVEL (UNFRACTIONATED): HEPARIN UNFRACTIONATED: 0.44 [IU]/mL (ref 0.30–0.70)

## 2017-10-25 MED ORDER — IPRATROPIUM-ALBUTEROL 0.5-2.5 (3) MG/3ML IN SOLN
3.0000 mL | Freq: Four times a day (QID) | RESPIRATORY_TRACT | Status: DC | PRN
  Administered 2017-10-25: 3 mL via RESPIRATORY_TRACT

## 2017-10-25 MED ORDER — IPRATROPIUM-ALBUTEROL 0.5-2.5 (3) MG/3ML IN SOLN
RESPIRATORY_TRACT | Status: AC
  Administered 2017-10-25: 3 mL via RESPIRATORY_TRACT
  Filled 2017-10-25: qty 3

## 2017-10-25 MED ORDER — SCOPOLAMINE 1 MG/3DAYS TD PT72
1.0000 | MEDICATED_PATCH | TRANSDERMAL | Status: DC
  Filled 2017-10-25 (×2): qty 1

## 2017-10-25 MED ORDER — METHYLPREDNISOLONE SODIUM SUCC 125 MG IJ SOLR
60.0000 mg | INTRAMUSCULAR | Status: DC
  Administered 2017-10-25 – 2017-10-28 (×4): 60 mg via INTRAVENOUS
  Filled 2017-10-25: qty 2
  Filled 2017-10-25 (×3): qty 0.96

## 2017-10-25 MED ORDER — WARFARIN SODIUM 7.5 MG PO TABS
15.0000 mg | ORAL_TABLET | Freq: Once | ORAL | Status: AC
  Administered 2017-10-25: 15 mg via ORAL
  Filled 2017-10-25: qty 2

## 2017-10-25 MED ORDER — SEVELAMER CARBONATE 2.4 G PO PACK
2.4000 g | PACK | Freq: Three times a day (TID) | ORAL | Status: DC
  Administered 2017-10-25 – 2017-10-30 (×16): 2.4 g
  Filled 2017-10-25 (×19): qty 1

## 2017-10-25 NOTE — Progress Notes (Signed)
PROGRESS NOTE    Chris [name].  FMB:[id number] DOB: [birth year] DOA: 10/01/2017 PCP: System, Pcp Not In   Brief Narrative:  52 yo male PMHx Depression HIV, A fib, COPD, and DM Type 2 without complication  Presented with dyspnea, cough, fever, and hypoxia and was found to have Influenza PNA causing ARDS. His hospital course has been complicated by hypervolemia and renal failure.     Subjective: 3/17 alert, follows commands. Complains of bilateral lower extremity weakness unable to lift his legs off the bed.Negative CP, negative SOB Negative abdominal pain.   Assessment & Plan:   Principal Problem:   Acute on chronic respiratory failure with hypoxia (HCC) Active Problems:   COPD exacerbation (HCC)   Diabetes mellitus type 2 in obese ([place])   COPD with exacerbation (HCC)   Atrial fibrillation (HCC)   HIV (human immunodeficiency virus infection) ([place])   Tobacco abuse   Bronchitis   AKI (acute kidney injury) ([place])   Endotracheally intubated   Influenza, pneumonia   Atrial fibrillation with RVR (HCC)   Elevated troponin   Goals of care, counseling/discussion   Palliative care by specialist   Palliative care encounter  Acute respiratory failure with hypoxia and hypercapnia/ARDS secondary positive influenza Pneumonia -failed weaning trial. Now with trach -3/17 did not tolerate SBT today extremely weak. -ventilator dependent continue daily SBT -Copious clear secretions: Scopolamine patch   COPD/Emphysema   -DuoNeb TID -Solu-Medrol 60 mg daily  Purulent Tracheobronchitis -Completed course of antibiotics no ongoing evidence of infection  A. Fib with RVR -Currently in NSR -amiodarone 200 mg daily -Cardizem 60 mg QID -Coumadin per pharmacy Recent Labs  Lab 10/19/17 0402 10/20/17 0407 10/21/17 0313 10/22/17 0524 10/23/17 0402 10/24/17 0509 10/25/17 0338  INR 2.76 3.15 2.97 1.94 1.48 1.29 1.35  -currently subtherapeutic. Continue heparin [place] until  therapeutic  Acute on Chronic diastolic CHF -Strict in and out -Daily weight -volume management per HD -transfuse for hemoglobin< 8   Hypotension  Resolved - BP stable    Acute renal failure (baseline Cr 0.79) -ATN   -intermittent  HD per Nephrology  Anemia of Critical illness/Chronic disease  Recent Labs  Lab 10/19/17 0402 10/20/17 0407 10/21/17 0313 10/22/17 0524 10/23/17 0402 10/24/17 0509 10/25/17 0338  HGB 8.2* 8.0* 7.6* 7.7* 7.3* 8.4* 8.1*  -stable   Esophageal Candidiasis -Complete course of treatment    HIV -considering patient treated for esophageal candidiasis, will obtain updated CD4 count good possibility< 200 -HIV RNA quant pending -continue Emtriva + Tivicay    DM 2 uncontrolled with complication -7/01 Hemoglobin A1c=10.5 -Lantus 20 units BID -Resistant SSI   Acute metabolic encephalopathy -appears improved but difficult to tell patient on trach. -uremia most likely contributing. Hopefully will imve with HD -schedule for HD on 3/18  Dysphagia -requested Swallow study 3/18   DVT prophylaxis: heparin drip Code Status: Full Family Communication: None Disposition Plan: TBD   Consultants:  Nephrology Radiology    Procedures/Significant Events:  2/21 Admit 2/22 Intubated 2/27 ID consulted for persistent fever 2/28 Renal consult for AKI, Cardiology consult for A fib with RVR 3/01 Trach 3/03 R IJ HD cath - attempted HD 3/04 Changed to CRRT 3/05 Worsening hypoxia >> vent settings adjusted 3/12 CRRT stopped  3/13 first iHD; PSV x 8 hrs    I have personally reviewed and interpreted all radiology studies and my findings are as above.  VENTILATOR SETTINGS:    Cultures   Antimicrobials: Anti-infectives (From admission, onward)   Start  Stop   10/24/17 2000  emtricitabine (EMTRIVA) capsule 200 mg         10/24/17 1018  ceFAZolin (ANCEF) 2-4 GM/100ML-% IVPB    Comments:  Hilma Favors   : cabinet override   10/24/17 1129    10/21/17 2000  tenofovir (VIREAD) tablet 300 mg         10/16/17 1200  piperacillin-tazobactam (ZOSYN) IVPB 3.375 g     10/20/17 1751   10/16/17 1000  emtricitabine (EMTRIVA) capsule 200 mg  Status:  Discontinued     10/21/17 0638   10/15/17 0900  vancomycin (VANCOCIN) IVPB 1000 mg/200 mL premix  Status:  Discontinued     10/15/17 1015   10/14/17 1200  piperacillin-tazobactam (ZOSYN) IVPB 3.375 g  Status:  Discontinued     10/16/17 0801   10/14/17 1000  tenofovir (VIREAD) tablet 300 mg  Status:  Discontinued     10/21/17 0638   10/14/17 0930  vancomycin (VANCOCIN) 2,500 mg in sodium chloride 0.9 % 500 mL IVPB     10/14/17 1159   10/14/17 0900  piperacillin-tazobactam (ZOSYN) IVPB 3.375 g  Status:  Discontinued     10/14/17 0905   10/12/17 0900  emtricitabine (EMTRIVA) capsule 200 mg  Status:  Discontinued     10/12/17 1036   10/11/17 0800  darunavir (PREZISTA) tablet 800 mg  Status:  Discontinued     10/10/17 1046   10/11/17 0800  ritonavir (NORVIR) tablet 100 mg  Status:  Discontinued     10/10/17 1046   10/07/17 1900  anidulafungin (ERAXIS) 100 mg in sodium chloride 0.9 % 100 mL IVPB     10/13/17 1954   10/07/17 1045  azithromycin (ZITHROMAX) 500 mg in sodium chloride 0.9 % 250 mL IVPB  Status:  Discontinued     10/09/17 0916   10/05/17 1000  oseltamivir (TAMIFLU) 6 MG/ML suspension 75 mg     10/06/17 1120   10/03/17 2200  oseltamivir (TAMIFLU) 6 MG/ML suspension 30 mg  Status:  Discontinued     10/05/17 0745   10/03/17 0400  fluconazole (DIFLUCAN) IVPB 100 mg  Status:  Discontinued     10/07/17 1758   10/02/17 1200  dolutegravir (TIVICAY) tablet 50 mg         10/02/17 1200  emtricitabine-tenofovir AF (DESCOVY) 200-25 MG per tablet 1 tablet  Status:  Discontinued     10/10/17 1046   10/02/17 1000  oseltamivir (TAMIFLU) 6 MG/ML suspension 75 mg  Status:  Discontinued     10/03/17 1550   10/02/17 0900  vancomycin (VANCOCIN) IVPB 1000 mg/200 mL premix  Status:  Discontinued      10/02/17 1053   10/02/17 0800  vancomycin (VANCOCIN) IVPB 1000 mg/200 mL premix  Status:  Discontinued     10/01/17 2009   10/02/17 0600  ceFEPIme (MAXIPIME) 2 g in sodium chloride 0.9 % 100 mL IVPB  Status:  Discontinued     10/08/17 0839   10/02/17 0400  fluconazole (DIFLUCAN) IVPB 200 mg     10/02/17 0603   10/01/17 2200  ceFEPIme (MAXIPIME) 1 g in sodium chloride 0.9 % 100 mL IVPB  Status:  Discontinued     10/01/17 1841   10/01/17 2030  vancomycin (VANCOCIN) 2,500 mg in sodium chloride 0.9 % 500 mL IVPB     10/01/17 2355   10/01/17 2015  ceFEPIme (MAXIPIME) 2 g in sodium chloride 0.9 % 100 mL IVPB  Status:  Discontinued     10/01/17 2006  10/01/17 2015  vancomycin (VANCOCIN) IVPB 1000 mg/200 mL premix  Status:  Discontinued     10/01/17 2006   10/01/17 2000  ceFEPIme (MAXIPIME) 1 g in sodium chloride 0.9 % 100 mL IVPB  Status:  Discontinued     10/01/17 2010   10/01/17 1900  vancomycin (VANCOCIN) 2,500 mg in sodium chloride 0.9 % 500 mL IVPB  Status:  Discontinued     10/01/17 2009   10/01/17 1830  oseltamivir (TAMIFLU) capsule 75 mg  Status:  Discontinued     10/02/17 0738   10/01/17 1815  bictegravir-emtricitabine-tenofovir AF (BIKTARVY) 50-200-25 MG per tablet 1 tablet  Status:  Discontinued     10/02/17 1059   10/01/17 1730  levofloxacin (LEVAQUIN) tablet 500 mg  Status:  Discontinued     10/01/17 1809       Devices    LINES / TUBES:      Continuous Infusions: . sodium chloride Stopped (10/24/17 0508)  . dexmedetomidine (PRECEDEX) IV infusion Stopped (10/23/17 1300)  . dextrose 25 mL/hr at 10/24/17 2000  . heparin 2,400 Units/hr (10/25/17 0629)     Objective: Vitals:   10/25/17 0040 10/25/17 0407 10/25/17 0458 10/25/17 0500  BP:      Pulse: 80  85   Resp: 20  (!) 22   Temp:  98.6 F (37 C)    TempSrc:  Oral    SpO2: 95%  96%   Weight:    248 lb 10.9 oz (112.8 kg)  Height:        Intake/Output Summary (Last 24 hours) at 10/25/2017 0743 Last data  filed at 10/25/2017 0500 Gross per 24 hour  Intake 2035.25 ml  Output 400 ml  Net 1635.25 ml   Filed Weights   10/23/17 1124 10/24/17 0500 10/25/17 0500  Weight: 205 lb 0.4 oz (93 kg) 216 lb 7.9 oz (98.2 kg) 248 lb 10.9 oz (112.8 kg)    Examination:  General: alert, follows commands, positive acute respiratory distress Neck:  Negative scars, masses, torticollis, lymphadenopathy, JVD,#6 cuffed trach in place, covered and clean negative sign of infection Lungs: Clear to auscultation bilaterally without wheezes or crackles, copious clear secretion suctioned from trachea. Cardiovascular: Regular rate and rhythm without murmur gallop or rub normal S1 and S2 Abdomen: negative abdominal pain, nondistended, positive soft, bowel sounds, no rebound, no ascites, no appreciable mass Extremities: No significant cyanosis, clubbing, or edema bilateral lower extremities Skin: Negative rashes, lesions, ulcers Psychiatric:  Negative depression, negative anxiety, negative fatigue, negative mania  Central nervous system:  Cranial nerves II through XII intact, tongue/uvula midline, bilateral upper extremity strength 4/5, bilateral lower extreme strength 2/5, sensation intact throughout,negative receptive aphasia.  .     Data Reviewed: Care during the described time interval was provided by me .  I have reviewed this patient's available data, including medical history, events of note, physical examination, and all test results as part of my evaluation.   CBC: Recent Labs  Lab 10/21/17 0313 10/22/17 0524 10/23/17 0402 10/24/17 0509 10/25/17 0338  WBC 18.5* 11.6* 11.8* 11.5* 11.4*  HGB 7.6* 7.7* 7.3* 8.4* 8.1*  HCT 25.5* 25.5* 23.6* 26.8* 26.1*  MCV 97.7 99.2 96.7 96.4 93.9  PLT 239 213 259 325 559   Basic Metabolic Panel: Recent Labs  Lab 10/21/17 0313 10/22/17 0524 10/23/17 0402 10/24/17 0509 10/25/17 0338  NA 133* 132* 131* 131* 131*  K 5.0 4.5 5.5* 4.2 4.5  CL 98* 95* 94* 94* 94*   CO2 _0 25  24  GLUCOSE 140* 129* 107* 60* 123*  BUN 89* 79* 119* 58* 81*  CREATININE 3.52* 3.54* 5.27* 4.04* 5.60*  CALCIUM 8.8* 8.5* 8.5* 8.6* 8.7*  PHOS 5.9* 7.3* 10.7* 6.7* 9.1*   GFR: Estimated Creatinine Clearance: 19.3 mL/min (A) (by C-G formula based on SCr of 5.6 mg/dL (H)). Liver Function Tests: Recent Labs  Lab 10/21/17 0313 10/22/17 0524 10/23/17 0402 10/24/17 0509 10/25/17 0338  ALBUMIN 1.9* 2.0* 2.1* 2.1* 2.1*   No results for input(s): LIPASE, AMYLASE in the last 168 hours. No results for input(s): AMMONIA in the last 168 hours. Coagulation Profile: Recent Labs  Lab 10/21/17 0313 10/22/17 0524 10/23/17 0402 10/24/17 0509 10/25/17 0338  INR 2.97 1.94 1.48 1.29 1.35   Cardiac Enzymes: No results for input(s): CKTOTAL, CKMB, CKMBINDEX, TROPONINI in the last 168 hours. BNP (last 3 results) No results for input(s): PROBNP in the last 8760 hours. HbA1C: No results for input(s): HGBA1C in the last 72 hours. CBG: Recent Labs  Lab 10/24/17 1155 10/24/17 1558 10/24/17 2014 10/25/17 0015 10/25/17 0406  GLUCAP 70 90 118* 114* 116*   Lipid Profile: No results for input(s): CHOL, HDL, LDLCALC, TRIG, CHOLHDL, LDLDIRECT in the last 72 hours. Thyroid Function Tests: No results for input(s): TSH, T4TOTAL, FREET4, T3FREE, THYROIDAB in the last 72 hours. Anemia Panel: No results for input(s): VITAMINB12, FOLATE, FERRITIN, TIBC, IRON, RETICCTPCT in the last 72 hours. Urine analysis:    Component Value Date/Time   COLORURINE AMBER (A) 10/14/2017 0844   APPEARANCEUR CLOUDY (A) 10/14/2017 0844   LABSPEC 1.015 10/14/2017 0844   PHURINE 5.0 10/14/2017 0844   GLUCOSEU 50 (A) 10/14/2017 0844   HGBUR LARGE (A) 10/14/2017 0844   BILIRUBINUR NEGATIVE 10/14/2017 0844   BILIRUBINUR neg 11/12/2016 0946   KETONESUR NEGATIVE 10/14/2017 0844   PROTEINUR 100 (A) 10/14/2017 0844   UROBILINOGEN 1.0 11/12/2016 0946   UROBILINOGEN 1 09/21/2014 1205   NITRITE NEGATIVE  10/14/2017 0844   LEUKOCYTESUR NEGATIVE 10/14/2017 0844   Sepsis Labs: _0 (procalcitonin:4,lacticidven:4)  )No results found for this or any previous visit (from the past 240 hour(s)).       Radiology Studies: Ir Fluoro Guide Cv Line Right  Result Date: 10/24/2017 INDICATION: Acute kidney injury EXAM: ULTRASOUND GUIDANCE FOR VASCULAR ACCESS RIGHT INTERNAL JUGULAR PERMANENT HEMODIALYSIS CATHETER Date:  10/24/2017 10/24/2017 10:57 am Radiologist:  M. Daryll Brod, MD Guidance:  Ultrasound and fluoroscopic FLUOROSCOPY TIME:  Fluoroscopy Time:  48 seconds (7 mGy). MEDICATIONS: Ancef 2 g administered within 1 hour of the procedure ANESTHESIA/SEDATION: Versed 1.0 mg IV; Fentanyl  50 mcg IV; Moderate Sedation Time:  15 minutes The patient was continuously monitored during the procedure by the interventional radiology nurse under my direct supervision. CONTRAST:  None. COMPLICATIONS: None immediate. PROCEDURE: Informed consent was obtained from the patient following explanation of the procedure, risks, benefits and alternatives. The patient understands, agrees and consents for the procedure. All questions were addressed. A time out was performed. Maximal barrier sterile technique utilized including caps, mask, sterile gowns, sterile gloves, large sterile drape, hand hygiene, and 2% chlorhexidine scrub. Under sterile conditions and local anesthesia, right internal jugular micropuncture venous access was performed with ultrasound. Images were obtained for documentation. A guide wire was inserted followed by a transitional dilator. Next, a 0.035 guidewire was advanced into the IVC with a 5-French catheter. Measurements were obtained from the right venotomy site to the proximal right atrium. In the right infraclavicular chest, a subcutaneous tunnel was created under sterile conditions and local anesthesia.  1% lidocaine with epinephrine was utilized for this. The 27 cm tip to cuff dialysis catheter was  tunneled subcutaneously to the venotomy site and inserted into the SVC/RA junction through a valved peel-away sheath. Position was confirmed with fluoroscopy. Images were obtained for documentation. Blood was aspirated from the catheter followed by saline and heparin flushes. The appropriate volume and strength of heparin was instilled in each lumen. Caps were applied. The catheter was secured at the tunnel site with Gelfoam and a pursestring suture. The venotomy site was closed with subcuticular Vicryl suture. Dermabond was applied to the small right neck incision. A dry sterile dressing was applied. The catheter is ready for use. No immediate complications. IMPRESSION: Ultrasound and fluoroscopically guided right internal jugular tunneled hemodialysis catheter (27 cm tip to cuff dialysis catheter). Electronically Signed   By: Jerilynn Mages.  Shick M.D.   On: 10/24/2017 11:02   Ir US Guide Vasc Access Right  Result Date: 10/24/2017 INDICATION: Acute kidney injury EXAM: ULTRASOUND GUIDANCE FOR VASCULAR ACCESS RIGHT INTERNAL JUGULAR PERMANENT HEMODIALYSIS CATHETER Date:  10/24/2017 10/24/2017 10:57 am Radiologist:  Jerilynn Mages. Daryll Brod, MD Guidance:  Ultrasound and fluoroscopic FLUOROSCOPY TIME:  Fluoroscopy Time:  48 seconds (7 mGy). MEDICATIONS: Ancef 2 g administered within 1 hour of the procedure ANESTHESIA/SEDATION: Versed 1.0 mg IV; Fentanyl  50 mcg IV; Moderate Sedation Time:  15 minutes The patient was continuously monitored during the procedure by the interventional radiology nurse under my direct supervision. CONTRAST:  None. COMPLICATIONS: None immediate. PROCEDURE: Informed consent was obtained from the patient following explanation of the procedure, risks, benefits and alternatives. The patient understands, agrees and consents for the procedure. All questions were addressed. A time out was performed. Maximal barrier sterile technique utilized including caps, mask, sterile gowns, sterile gloves, large sterile drape,  hand hygiene, and 2% chlorhexidine scrub. Under sterile conditions and local anesthesia, right internal jugular micropuncture venous access was performed with ultrasound. Images were obtained for documentation. A guide wire was inserted followed by a transitional dilator. Next, a 0.035 guidewire was advanced into the IVC with a 5-French catheter. Measurements were obtained from the right venotomy site to the proximal right atrium. In the right infraclavicular chest, a subcutaneous tunnel was created under sterile conditions and local anesthesia. 1% lidocaine with epinephrine was utilized for this. The 27 cm tip to cuff dialysis catheter was tunneled subcutaneously to the venotomy site and inserted into the SVC/RA junction through a valved peel-away sheath. Position was confirmed with fluoroscopy. Images were obtained for documentation. Blood was aspirated from the catheter followed by saline and heparin flushes. The appropriate volume and strength of heparin was instilled in each lumen. Caps were applied. The catheter was secured at the tunnel site with Gelfoam and a pursestring suture. The venotomy site was closed with subcuticular Vicryl suture. Dermabond was applied to the small right neck incision. A dry sterile dressing was applied. The catheter is ready for use. No immediate complications. IMPRESSION: Ultrasound and fluoroscopically guided right internal jugular tunneled hemodialysis catheter (27 cm tip to cuff dialysis catheter). Electronically Signed   By: Jerilynn Mages.  Shick M.D.   On: 10/24/2017 11:02        Scheduled Meds: . amiodarone  200 mg Per Tube Daily  . chlorhexidine gluconate (MEDLINE KIT)  15 mL Mouth Rinse BID  . Chlorhexidine Gluconate Cloth  6 each Topical Daily  . clonazePAM  0.5 mg Per Tube QHS  . diltiazem  60 mg Per Tube Q6H  . docusate  100 mg Per  Tube BID  . dolutegravir  50 mg Oral Daily  . emtricitabine  200 mg Oral Once every 4 days  . famotidine  20 mg Per Tube QHS  . feeding  supplement (VITAL HIGH PROTEIN)  1,000 mL Per Tube Q24H  . insulin aspart  0-20 Units Subcutaneous Q4H  . insulin glargine  20 Units Subcutaneous BID  . ipratropium-albuterol  3 mL Nebulization TID  . mouth rinse  15 mL Mouth Rinse QID  . polyethylene glycol  17 g Oral Daily  . sennosides  5 mL Per Tube QHS  . sevelamer carbonate  2.4 g Per Tube TID WC  . sodium chloride flush  10-40 mL Intracatheter Q12H  . sodium chloride flush  3 mL Intravenous Q12H  . tenofovir  300 mg Oral Q Wed  . Warfarin - Pharmacist Dosing Inpatient   Does not apply q1800   Continuous Infusions: . sodium chloride Stopped (10/24/17 0508)  . dexmedetomidine (PRECEDEX) IV infusion Stopped (10/23/17 1300)  . dextrose 25 mL/hr at 10/24/17 2000  . heparin 2,400 Units/hr (10/25/17 0629)     LOS: 24 days    Time spent: 40 minutes    WOODS, Geraldo Docker, MD [facility] Pager [phone]   If 7PM-7AM, please contact night-coverage [email or web address] Password [facility] 10/25/2017, 7:43 AM

## 2017-10-25 NOTE — Progress Notes (Signed)
[facility] Rounding Note  Background: 52 yo AAM HIV AF COPD MO Admitted w/resp failure, VDRF. + Flu A/PNA; possible PE;  Baseline creatinine 0.79 10/01/17. AKI in setting of above issues.  CRRT initiated 10/12/17 for massive anasarca /hyperkalemia. R IJ temp cath 3/3 by CCM.   Assessment/Recommendations 1. AKI  - normal creatinine at baseline (0.79). Dense ATN. Markedly catabolic. CRRT  3/4-3/12. Weight down  CVP 7-11 - 400 UOP recorded.  Would still be optimistic for eventual renal recovery given normal kidney function at baseline - CRTT stopped 3/12-   IHD 3/13 and 3/15   vascath  transitioned of vascath to [facility] per IR on 3/16. HD next planned for 3/18.  Now maybe urine- nursing will monitor and if cannot void may need to replace foley  2. Hyperkalemia - necessitated use of all 0 K fluids for a while-  better  3. HTN/vol- - BP now much higher- holding midodrine- UF with HD and cardizem 4. VDRF/COPD/ARDS/FluA+ - finished Tamiflu/ATB's; on steroids - weaning. Per CCM- still with relatively high O2 need  5. HIV - meds; ID following 6. Esophageal candidiasis - noted during intubation. IV antifungals done  7. Fevers/leukocytosis. CCM/ID addressing. Vanco and zosyn for possible PNA- now off 8. AF/RVR - amio/heparin/low dose dilt- now in NSR 9. Anemia-  gave darbe 100 3/12- hgb down to 7.6-  transfuse with HD one unit on 3/13- did not make much difference- gave another unit 3/15-  10. Hyperphos- will start renvela per tube   Chris [name] A 10/18/2017, 7:56 AM  Subjective/Interval History:  Resting and comfortable 400 urine per I and O cath S/p permcath yesterday   Objective Vital signs in last 24 hours: Vitals:   10/25/17 0040 10/25/17 0407 10/25/17 0458 10/25/17 0500  BP:      Pulse: 80  85   Resp: 20  (!) 22   Temp:  98.6 F (37 C)    TempSrc:  Oral    SpO2: 95%  96%   Weight:    112.8 kg (248 lb 10.9 oz)  Height:       Weight change: 17.8 kg (39 lb 3.9 oz)  Intake/Output Summary  (Last 24 hours) at 10/25/2017 0725 Last data filed at 10/25/2017 0500 Gross per 24 hour  Intake 2035.25 ml  Output 400 ml  Net 1635.25 ml   Physical Exam:  Blood pressure (!) 159/75, pulse 85, temperature 98.6 F (37 C), temperature source Oral, resp. rate (!) 22, height 5' 9" (1.753 m), weight 112.8 kg (248 lb 10.9 oz), SpO2 96 %.   Resting on vent R IJ tunneled cath Trach/vent/scd's  Ant clear Tachy S1S2 NoS3 Obese abdomen, + BS still tight SCD's in place Wrinkling skin of feet  edema LE's better but still present  Weight trending 3/17 112 (do not think is right)  3/16 98.2 3/15  95 kg         10/18/17 107.3 kg   10/17/17 107.7 kg Changed to keep even  10/16/17 110.4 kg   10/15/17  114 kg   10/15/15 119.8 k   10/13/17 122.5 kg   10/12/17  130.8 kg  CRRT started  10/11/17  127.6 kg    10/10/17  126.1      Recent Labs  Lab 10/20/17 0407 10/20/17 1531 10/21/17 0313 10/22/17 0524 10/23/17 0402 10/24/17 0509 10/25/17 0338  NA 133* 137 133* 132* 131* 131* 131*  K 4.3 3.5 5.0 4.5 5.5* 4.2 4.5  CL 99* 108 98* 95* 94* 94*  94*  CO2 25 19* 22 25 23 25 24  GLUCOSE 115* 126* 140* 129* 107* 60* 123*  BUN 45* 46* 89* 79* 119* 58* 81*  CREATININE 1.99* 1.89* 3.52* 3.54* 5.27* 4.04* 5.60*  CALCIUM 8.5* 6.5* 8.8* 8.5* 8.5* 8.6* 8.7*  PHOS 3.7 2.8 5.9* 7.3* 10.7* 6.7* 9.1*    Recent Labs  Lab 10/23/17 0402 10/24/17 0509 10/25/17 0338  ALBUMIN 2.1* 2.1* 2.1*    Recent Labs  Lab 10/22/17 0524 10/23/17 0402 10/24/17 0509 10/25/17 0338  WBC 11.6* 11.8* 11.5* 11.4*  HGB 7.7* 7.3* 8.4* 8.1*  HCT 25.5* 23.6* 26.8* 26.1*  MCV 99.2 96.7 96.4 93.9  PLT 213 259 325 321     Recent Labs  Lab 10/24/17 1155 10/24/17 1558 10/24/17 2014 10/25/17 0015 10/25/17 0406  GLUCAP 70 90 118* 114* 116*    Studies/Results: Dg Chest Portable 1 View  Result Date: 10/16/2017 CLINICAL DATA:  Respiratory failure EXAM: PORTABLE CHEST 1 VIEW COMPARISON:  10/14/2017 FINDINGS: Tracheostomy  in good position. Bilateral central venous catheter tips in the SVC unchanged. Feeding tube in place with the tip not visualized. Cardiac enlargement with mild vascular congestion which has improved in the interval. Improvement in bibasilar airspace disease. No pleural effusion IMPRESSION: Improvement in bilateral airspace disease. Support lines remain in good position. Electronically Signed   By: Charles  Clark M.D.   On: 10/16/2017   Dg Chest Port 1 View  Result Date: 10/17/2017 CLINICAL DATA:  Respiratory failure. EXAM: PORTABLE CHEST 1 VIEW COMPARISON:  10/16/2017 FINDINGS: Tracheostomy tube overlies the airway. Bilateral jugular catheters terminate over the upper SVC. Feeding tube courses into the left upper abdomen with tip not imaged. The cardiac silhouette remains enlarged. Mild diffuse prominence of the interstitial markings is unchanged and may be at least in part chronic and secondary to known emphysema. Mild patchy basilar opacities are minimally increased on the right and unchanged on the left. No overt edema, sizable pleural effusion, or pneumothorax is identified. IMPRESSION: Mild bibasilar opacities, minimally increased on the right and which may reflect atelectasis. Electronically Signed   By: Allen  Grady M.D.   On: 10/17/2017 07:41   Medications: . sodium chloride Stopped (10/24/17 0508)  . dexmedetomidine (PRECEDEX) IV infusion Stopped (10/23/17 1300)  . dextrose 25 mL/hr at 10/24/17 2000  . heparin 2,400 Units/hr (10/25/17 0629)   Medications . amiodarone  200 mg Per Tube Daily  . chlorhexidine gluconate (MEDLINE KIT)  15 mL Mouth Rinse BID  . Chlorhexidine Gluconate Cloth  6 each Topical Daily  . clonazePAM  0.5 mg Per Tube QHS  . diltiazem  60 mg Per Tube Q6H  . docusate  100 mg Per Tube BID  . dolutegravir  50 mg Oral Daily  . emtricitabine  200 mg Oral Once every 4 days  . famotidine  20 mg Per Tube QHS  . feeding supplement (VITAL HIGH PROTEIN)  1,000 mL Per Tube Q24H   . insulin aspart  0-20 Units Subcutaneous Q4H  . insulin glargine  20 Units Subcutaneous BID  . ipratropium-albuterol  3 mL Nebulization TID  . mouth rinse  15 mL Mouth Rinse QID  . polyethylene glycol  17 g Oral Daily  . sennosides  5 mL Per Tube QHS  . sodium chloride flush  10-40 mL Intracatheter Q12H  . sodium chloride flush  3 mL Intravenous Q12H  . tenofovir  300 mg Oral Q Wed  . Warfarin - Pharmacist Dosing Inpatient   Does not apply q1800     

## 2017-10-25 NOTE — Procedures (Signed)
Called by RN for low Vt, patient stating he couldn't breath.  Patient was has been able to occasionally talk around cuff/trach at times, with a positional leak but good Vt returns.  All air was retracted from cuff, trach repositioned and 10 cc placed back in for a cuff pressure of 26. Vt consistent with previously trended Vt.    Dr. Oval LinseyScatliff at the bedside, will wait on trach change until AM per her request.  RT will continue to monitor.

## 2017-10-25 NOTE — Progress Notes (Signed)
Pt placed back on full vent support due to increased WOB, RR, and desat.  RN at bedside.

## 2017-10-25 NOTE — Progress Notes (Signed)
This RN called and explained to pharmacist Fayrene FearingJames clonipine 0.5 mg tablet inadvertently placed in needle box, okay to withdraw another clonazepam 0.5mg  tablet from pyxis per Fayrene FearingJames

## 2017-10-25 NOTE — Progress Notes (Signed)
ANTICOAGULATION CONSULT NOTE - Follow Up Consult  Pharmacy Consult:  Heparin/warfarin Indication: pulmonary embolus/Afib  Allergies  Allergen Reactions  . Bactrim [Sulfamethoxazole-Trimethoprim] Hives    Patient Measurements: Height: 5\' 9"  (175.3 cm) Weight: 248 lb 10.9 oz (112.8 kg) IBW/kg (Calculated) : 70.7 Heparin dosing weight: 97 kg  Vital Signs: Temp: 98.6 F (37 C) (03/17 0805) Temp Source: Oral (03/17 0805) BP: 142/79 (03/17 1000) Pulse Rate: 78 (03/17 1000)  Labs: Recent Labs    10/23/17 0402  10/24/17 0051 10/24/17 0509 10/25/17 0338  HGB 7.3*  --   --  8.4* 8.1*  HCT 23.6*  --   --  26.8* 26.1*  PLT 259  --   --  325 321  LABPROT 17.8*  --   --  15.9* 16.6*  INR 1.48  --   --  1.29 1.35  HEPARINUNFRC 0.81*   < > 0.45 0.60 0.44  CREATININE 5.27*  --   --  4.04* 5.60*   < > = values in this interval not displayed.    Estimated Creatinine Clearance: 19.3 mL/min (A) (by C-G formula based on SCr of 5.6 mg/dL (H)).  Assessment: [age] YOM with Afib on Eliquis PTA, transitioned to warfarin given possible PE on Eliquis (heparin bridge complete). Patient now off CRRT transitioning to HD - requires tunneled HD catheter placement by IR, which requires INR < 1.5 per IR notes.   Patient s/p tunneled catheter placement 3/16. Warfarin resumed 3/16. HgB remains low, but stable. PLTs stable WNL. Patient previously required higher than usual doses of warfarin to achieve therapeutic INR. Will give another boosted dose this evening. Noted drug-drug interaction with amiodarone.  3/17  Heparin level remains therapeutic: 0.44 INR: 1.35 after first dose of warfarin  Goal of Therapy:  INR 2-3  Heparin level 0.3-0.7 units/mL Monitor platelets by anticoagulation protocol: Yes   Plan: Warfarin 15mg  x1 tonight Continue heparin at 2400 units/hr until INR therpaeutic Daily heparin level/INR Monitor for s/sx of bleeding  Ruben Imony Dilan Novosad, PharmD Clinical Pharmacist 10/25/2017  10:40 AM

## 2017-10-26 LAB — GLUCOSE, CAPILLARY
GLUCOSE-CAPILLARY: 121 mg/dL — AB (ref 65–99)
GLUCOSE-CAPILLARY: 132 mg/dL — AB (ref 65–99)
GLUCOSE-CAPILLARY: 238 mg/dL — AB (ref 65–99)
Glucose-Capillary: 201 mg/dL — ABNORMAL HIGH (ref 65–99)
Glucose-Capillary: 156 mg/dL — ABNORMAL HIGH (ref 65–99)
Glucose-Capillary: 139 mg/dL — ABNORMAL HIGH (ref 65–99)
Glucose-Capillary: 113 mg/dL — ABNORMAL HIGH (ref 65–99)
Glucose-Capillary: 123 mg/dL — ABNORMAL HIGH (ref 65–99)
Glucose-Capillary: 153 mg/dL — ABNORMAL HIGH (ref 65–99)

## 2017-10-26 LAB — PROTIME-INR
INR: 1.4
Prothrombin Time: 17 seconds — ABNORMAL HIGH (ref 11.4–15.2)

## 2017-10-26 LAB — HEPARIN LEVEL (UNFRACTIONATED): Heparin Unfractionated: 0.48 IU/mL (ref 0.30–0.70)

## 2017-10-26 MED ORDER — CLONAZEPAM 0.25 MG PO TBDP
0.2500 mg | ORAL_TABLET | Freq: Every day | ORAL | Status: DC
  Administered 2017-10-26 – 2017-10-30 (×3): 0.25 mg
  Filled 2017-10-26 (×3): qty 1

## 2017-10-26 MED ORDER — FENTANYL CITRATE (PF) 100 MCG/2ML IJ SOLN
25.0000 ug | INTRAMUSCULAR | Status: DC | PRN
  Administered 2017-10-26 – 2017-10-30 (×6): 25 ug via INTRAVENOUS
  Filled 2017-10-26 (×6): qty 2

## 2017-10-26 MED ORDER — TRAMADOL HCL 50 MG PO TABS: 50.0000 mg | ORAL_TABLET | Freq: Two times a day (BID) | ORAL | Status: DC | PRN

## 2017-10-26 MED ORDER — WARFARIN SODIUM 7.5 MG PO TABS
15.0000 mg | ORAL_TABLET | Freq: Once | ORAL | Status: AC
  Administered 2017-10-26: 15 mg via ORAL
  Filled 2017-10-26: qty 2

## 2017-10-26 MED ORDER — OXYCODONE HCL 5 MG/5ML PO SOLN
5.0000 mg | ORAL | Status: DC | PRN
  Administered 2017-10-26: 5 mg via ORAL
  Administered 2017-10-26: 10 mg via ORAL
  Administered 2017-10-26: 5 mg via ORAL
  Administered 2017-10-27 – 2017-10-29 (×9): 10 mg via ORAL
  Administered 2017-10-29: 5 mg via ORAL
  Administered 2017-10-29 – 2017-10-30 (×7): 10 mg via ORAL
  Filled 2017-10-26 (×5): qty 10
  Filled 2017-10-26: qty 5
  Filled 2017-10-26 (×2): qty 10
  Filled 2017-10-26 (×2): qty 5
  Filled 2017-10-26 (×11): qty 10

## 2017-10-26 MED ORDER — TRAMADOL HCL 50 MG PO TABS
50.0000 mg | ORAL_TABLET | Freq: Two times a day (BID) | ORAL | Status: DC | PRN
  Administered 2017-10-26 – 2017-10-27 (×3): 50 mg
  Filled 2017-10-26 (×3): qty 1

## 2017-10-26 MED ORDER — TRAMADOL 5 MG/ML ORAL SUSPENSION: 50.0000 mg | Freq: Four times a day (QID) | ORAL | Status: DC | PRN

## 2017-10-26 NOTE — Progress Notes (Signed)
CSW made aware per Boneta LucksJenny from [facility], they are unable to meet pt's needs a pt is needing dialysis and will need it at the time of discharge. CSW will continue to seek placement as needed for pt.   Claude MangesKierra S. Ellakate Gonsalves, MSW, LCSW-A Emergency Department Clinical Social Worker [phone]

## 2017-10-26 NOTE — Progress Notes (Signed)
ANTICOAGULATION CONSULT NOTE - Follow Up Consult  Pharmacy Consult:  Heparin/warfarin Indication: pulmonary embolus/Afib  Allergies  Allergen Reactions  . Bactrim [Sulfamethoxazole-Trimethoprim] Hives    Patient Measurements: Height: 5\' 9"  (175.3 cm) Weight: 260 lb 2.3 oz (118 kg) IBW/kg (Calculated) : 70.7 Heparin dosing weight: 97 kg  Vital Signs: Temp: 98.5 F (36.9 C) (03/18 1205) Temp Source: Oral (03/18 1205) BP: 190/83 (03/18 1136) Pulse Rate: 89 (03/18 1055)  Labs: Recent Labs    10/24/17 0509 10/25/17 0338 10/26/17 0849  HGB 8.4* 8.1*  --   HCT 26.8* 26.1*  --   PLT 325 321  --   LABPROT 15.9* 16.6* 17.0*  INR 1.29 1.35 1.40  HEPARINUNFRC 0.60 0.44 0.48  CREATININE 4.04* 5.60*  --     Estimated Creatinine Clearance: 19.8 mL/min (A) (by C-G formula based on SCr of 5.6 mg/dL (H)).  Assessment: [age] YOM with Afib on Eliquis PTA, transitioned to warfarin given possible PE on Eliquis, successfully completed heparin bridge to INR > 2, however patient then required tunneled HD catheter placement with INR < 1.5 so warfarin was held x 4 days. S/p IR procedure 3/16 warfarin has been resumed with heparin bridge until INR > 2.  HgB remains low, but stable. PLTs stable WNL. Patient previously required high doses to achieve therapeutic INR. Will give another boosted dose this evening. Noted drug-drug interaction with amiodarone. No bleeding noted.  Heparin level remains therapeutic: 0.48 INR: 1.4, trending up  Goal of Therapy:  INR 2-3  Heparin level 0.3-0.7 units/mL Monitor platelets by anticoagulation protocol: Yes   Plan: Warfarin 15mg  x1 tonight Continue heparin at 2400 units/hr until INR therpaeutic Daily heparin level/INR Monitor for s/sx of bleeding   Al CorpusLindsey Sayvon Arterberry, PharmD PGY1 Pharmacy Resident Pager: [phone] After 4:00PM please call Main Pharmacy [phone] 10/26/2017 12:40 PM

## 2017-10-26 NOTE — Progress Notes (Signed)
Oxygen sats 88% after pt placed on right side lying position with hob greater than 30 degrees, pt reports difficulty breathing, pt appears in no distress, RT to bedside, nursing repositioned pt onto back and maintained hob elevated, RT administered nebulizer tx and titrated fio2 up. Oxygen sats improved to 94% on 60% fio2.

## 2017-10-26 NOTE — Progress Notes (Signed)
Called HD and spoke to nurse Velna HatchetSheila, nurse states okay to give pt scheduled cardizem this am.

## 2017-10-26 NOTE — Progress Notes (Signed)
[facility] Rounding Note  Background: 52 yo AAM HIV AF COPD MO Admitted w/resp failure, VDRF. + Flu A/PNA; possible PE;  Baseline creatinine 0.79 10/01/17. AKI in setting of above issues.  CRRT initiated 10/12/17 for massive anasarca /hyperkalemia. R IJ temp cath 3/3 by CCM.   Assessment/Recommendations 1. AKI  - normal creatinine at baseline (0.79). CRRT  3/4-3/12. Weight down  CVP 7-11 - 400 UOP recorded.   IHD 3/13 and 3/15   vascath  transitioned of vascath to [facility] per IR on 3/16. HD next planned for 3/18.  He reports he is making urine but poor documentation 2. HTN/vol- - BP now much higher- holding midodrine- UF with HD and cardizem 3. VDRF/COPD/ARDS/FluA+ - finished Tamiflu/ATB's; on steroids - weaning. Per CCM-   4. HIV - meds; ID following 5. Esophageal candidiasis - IV antifungals done  6. AF/RVR - amio/heparin/low dose dilt- now in NSR 7. Anemia-  ESA and PRBCs   Subjective: Interval History: For HD today, remains oliguric  Objective: Vital signs in last 24 hours: Temp:  [98.5 F (36.9 C)-99.6 F (37.6 C)] 98.5 F (36.9 C) (03/18 1205) Pulse Rate:  [80-98] 80 (03/18 1300) Resp:  [19-30] 22 (03/18 1300) BP: (132-190)/(67-89) 152/79 (03/18 1300) SpO2:  [89 %-99 %] 94 % (03/18 1309) FiO2 (%):  [40 %-60 %] 40 % (03/18 1309) Weight:  [118 kg (260 lb 2.3 oz)] 118 kg (260 lb 2.3 oz) (03/18 0500) Weight change: 5.2 kg (11 lb 7.4 oz)  Intake/Output from previous day: 03/17 0701 - 03/18 0700 In: 2682 [I.V.:1127; NG/GT:1555] Out: 1 [Urine:1] Intake/Output this shift: Total I/O In: 575 [I.V.:175; NG/GT:400] Out: -   General appearance: alert and cooperative Resp: clear to auscultation bilaterally GI: soft, non-tender; bowel sounds normal; no masses,  no organomegaly Extremities: edema tr  Lab Results: Recent Labs    10/24/17 0509 10/25/17 0338  WBC 11.5* 11.4*  HGB 8.4* 8.1*  HCT 26.8* 26.1*  PLT 325 321   BMET:  Recent Labs    10/24/17 0509 10/25/17 0338  NA 131*  131*  K 4.2 4.5  CL 94* 94*  CO2 25 24  GLUCOSE 60* 123*  BUN 58* 81*  CREATININE 4.04* 5.60*  CALCIUM 8.6* 8.7*   No results for input(s): PTH in the last 72 hours. Iron Studies: No results for input(s): IRON, TIBC, TRANSFERRIN, FERRITIN in the last 72 hours. Studies/Results: No results found.  Scheduled: . amiodarone  200 mg Per Tube Daily  . chlorhexidine gluconate (MEDLINE KIT)  15 mL Mouth Rinse BID  . Chlorhexidine Gluconate Cloth  6 each Topical Daily  . clonazePAM  0.25 mg Per Tube Daily  . clonazePAM  0.5 mg Per Tube QHS  . diltiazem  60 mg Per Tube Q6H  . docusate  100 mg Per Tube BID  . dolutegravir  50 mg Oral Daily  . emtricitabine  200 mg Oral Once every 4 days  . famotidine  20 mg Per Tube QHS  . feeding supplement (VITAL HIGH PROTEIN)  1,000 mL Per Tube Q24H  . insulin aspart  0-20 Units Subcutaneous Q4H  . insulin glargine  20 Units Subcutaneous BID  . ipratropium-albuterol  3 mL Nebulization TID  . mouth rinse  15 mL Mouth Rinse QID  . methylPREDNISolone (SOLU-MEDROL) injection  60 mg Intravenous Q24H  . polyethylene glycol  17 g Oral Daily  . scopolamine  1 patch Transdermal Q72H  . sennosides  5 mL Per Tube QHS  . sevelamer carbonate  2.4 g Per Tube TID WC  . sodium chloride flush  10-40 mL Intracatheter Q12H  . sodium chloride flush  3 mL Intravenous Q12H  . tenofovir  300 mg Oral Q Wed  . warfarin  15 mg Oral ONCE-1800  . Warfarin - Pharmacist Dosing Inpatient   Does not apply q1800      LOS: 25 days   Chris [name] 10/26/2017,2:23 PM

## 2017-10-26 NOTE — Progress Notes (Signed)
[place].  LAG:[id number] DOB: [birth year] DOA: 10/01/2017 PCP: System, Pcp Not In    Brief Narrative:  52 yo male w/ a Hx of HIV, A fib, COPD, and DM who presented with dyspnea, cough, fever, and hypoxia and was found to have Influenza PNA causing ARDS.  His hospital course has been complicated by hypervolemia and renal failure.   Significant Events: 2/21 Admit 2/22 Intubated 2/27 ID consulted for persistent fever 2/28 Renal consult for AKI, Cardiology consult for A fib with RVR 3/01 Trach 3/03 R IJ HD cath - attempted HD 3/04 Changed to CRRT 3/05 Worsening hypoxia >> vent settings adjusted 3/12 CRRT stopped  3/13 first iHD; PSV x 8 hrs 3/16 tunneled R IJ HD cath placed   Subjective: Working w/ therapy at time of my visit.  Appears mildly agitated, but in no acute resp distress.  C/o poorly controlled pain in legs and back of an acute on chronic nature.  No PMV valve in place, but is able to speak somewhat, and tells me he wants to eat.      Assessment & Plan:  Acute hypoxic and hypercapnic respiratory failure - Influenza PNA - ARDS Remains trach/vent dependent - PCCM managing weaning   COPD with emphysema No acute exacerbation at this time   Purulent tracheobronchitis  Has completed a course of abx tx - no evidence of ongoing infection   A fib with RVR Continue amiodarone, diltiazem - cont coumadin - NSR at time of exam today  Acute on chronic diastolic CHF  Volume management per HD Filed Weights   10/24/17 0500 10/25/17 0500 10/26/17 0500  Weight: 98.2 kg (216 lb 7.9 oz) 112.8 kg (248 lb 10.9 oz) 118 kg (260 lb 2.3 oz)    Hypotension  BP stable   Acute renal failure - ATN Nephrology following - HD now intermittent - normal crt at baseline (0.8) - CRRT stopped 3/12, now on IHD - vascath removed and permacath placed in IR 3/16  Recent Labs  Lab 10/21/17 0313 10/22/17 0524 10/23/17 0402 10/24/17 0509 10/25/17 0338    CREATININE 3.52* 3.54* 5.27* 4.04* 5.60*     Anemia of critical illness and chronic disease Hgb stable   Recent Labs  Lab 10/22/17 0524 10/23/17 0402 10/24/17 0509 10/25/17 0338  HGB 7.7* 7.3* 8.4* 8.1*    Esophageal candidiasis Noted at time of intubation - has completed a tx course   HIV Continue Emtriva, Tivicay, tenofovir  DM Having some hyperglycemia - adjust tx and follow    Acute metabolic encephalopathy slowly improving - follow - wean sedatives as able - uremia likely contributing   DVT prophylaxis: SCDs Code Status: FULL CODE Family Communication: no family present at time of exam  Disposition Plan: SDU - needs LTACH for prolonged vent wean and ongoing HD   Consultants:  PCCM Nephrology   Antimicrobials:  Vancomycin 2/21 >2/22 Cefepime 2/21 > 2/28 Tamiflu 2/21 > 2/26 Fluconazole 2/21 > 2/27 Anidulafungin 2/27 >3/6 Zithromax 2/27 > 3/1  Objective: Blood pressure (!) 165/89, pulse 87, temperature 98.8 F (37.1 C), temperature source Oral, resp. rate (!) 27, height _0  (1.753 m), weight 118 kg (260 lb 2.3 oz), SpO2 98 %.  Intake/Output Summary (Last 24 hours) at 10/26/2017 0926 Last data filed at 10/26/2017 0700 Gross per 24 hour  Intake 2424 ml  Output 1 ml  Net 2423 ml   Filed Weights   10/24/17 0500 10/25/17 0500 10/26/17 0500  Weight: 98.2 kg (216 lb 7.9 oz) 112.8 kg (248 lb 10.9 oz) 118 kg (260 lb 2.3 oz)    Examination: General: No acute respiratory distress - standing in tilt bed  Lungs: Clear to auscultation bilaterally  Cardiovascular: Regular rate and rhythm - no rub  Abdomen: Nontender, obese, soft, bowel sounds positive, no rebound Extremities: 2+ B LE edema w/o change   CBC: Recent Labs  Lab 10/21/17 0313 10/22/17 0524 10/23/17 0402 10/24/17 0509 10/25/17 0338  WBC 18.5* 11.6* 11.8* 11.5* 11.4*  HGB 7.6* 7.7* 7.3* 8.4* 8.1*  HCT 25.5* 25.5* 23.6* 26.8* 26.1*  MCV 97.7 99.2 96.7 96.4 93.9  PLT 239 213 259 325 793    Basic Metabolic Panel: Recent Labs  Lab 10/21/17 0313 10/22/17 0524 10/23/17 0402 10/24/17 0509 10/25/17 0338  NA 133* 132* 131* 131* 131*  K 5.0 4.5 5.5* 4.2 4.5  CL 98* 95* 94* 94* 94*  CO2 _0 GLUCOSE 140* 129* 107* 60* 123*  BUN 89* 79* 119* 58* 81*  CREATININE 3.52* 3.54* 5.27* 4.04* 5.60*  CALCIUM 8.8* 8.5* 8.5* 8.6* 8.7*  PHOS 5.9* 7.3* 10.7* 6.7* 9.1*   GFR: Estimated Creatinine Clearance: 19.8 mL/min (A) (by C-G formula based on SCr of 5.6 mg/dL (H)).  Liver Function Tests: Recent Labs  Lab 10/21/17 0313 10/22/17 0524 10/23/17 0402 10/24/17 0509 10/25/17 0338  ALBUMIN 1.9* 2.0* 2.1* 2.1* 2.1*    Coagulation Profile: Recent Labs  Lab 10/21/17 0313 10/22/17 0524 10/23/17 0402 10/24/17 0509 10/25/17 0338  INR 2.97 1.94 1.48 1.29 1.35    HbA1C: Hemoglobin A1C  Date/Time Value Ref Range Status  09/02/2017 09:33 AM 10.5  Final  06/01/2017 10:48 AM 8.8  Final   Hgb A1c MFr Bld  Date/Time Value Ref Range Status  01/03/2016 12:23 PM 8.5 (H) <5.7 % Final    Comment:      For someone without known diabetes, a hemoglobin A1c value of 6.5% or greater indicates that they may have diabetes and this should be confirmed with a follow-up test.   For someone with known diabetes, a value <7% indicates that their diabetes is well controlled and a value greater than or equal to 7% indicates suboptimal control. A1c targets should be individualized based on duration of diabetes, age, comorbid conditions, and other considerations.   Currently, no consensus exists for use of hemoglobin A1c for diagnosis of diabetes for children.     12/07/2014 06:10 AM 6.0 (H) 4.8 - 5.6 % Final    Comment:    (NOTE)         Pre-diabetes: 5.7 - 6.4         Diabetes: >6.4         Glycemic control for adults with diabetes: <7.0     CBG: Recent Labs  Lab 10/25/17 1648 10/25/17 1947 10/25/17 2359 10/26/17 0349 10/26/17 0736  GLUCAP 102* 150* 132* 201*  238*     Scheduled Meds: . amiodarone  200 mg Per Tube Daily  . chlorhexidine gluconate (MEDLINE KIT)  15 mL Mouth Rinse BID  . Chlorhexidine Gluconate Cloth  6 each Topical Daily  . clonazePAM  0.5 mg Per Tube QHS  . diltiazem  60 mg Per Tube Q6H  . docusate  100 mg Per Tube BID  . dolutegravir  50 mg Oral Daily  . emtricitabine  200 mg Oral Once every 4 days  . famotidine  20 mg Per Tube QHS  . feeding supplement (VITAL HIGH PROTEIN)  1,000 mL Per Tube Q24H  . insulin aspart  0-20 Units Subcutaneous Q4H  . insulin glargine  20 Units Subcutaneous BID  . ipratropium-albuterol  3 mL Nebulization TID  . mouth rinse  15 mL Mouth Rinse QID  . methylPREDNISolone (SOLU-MEDROL) injection  60 mg Intravenous Q24H  . polyethylene glycol  17 g Oral Daily  . scopolamine  1 patch Transdermal Q72H  . sennosides  5 mL Per Tube QHS  . sevelamer carbonate  2.4 g Per Tube TID WC  . sodium chloride flush  10-40 mL Intracatheter Q12H  . sodium chloride flush  3 mL Intravenous Q12H  . tenofovir  300 mg Oral Q Wed  . Warfarin - Pharmacist Dosing Inpatient   Does not apply q1800     LOS: 25 days   Cherene Altes, MD [facility] Office  [phone] Pager - Text Page per Amion as per below:  On-Call/Text Page:      [email or web address]      password [email or web address]  If 7PM-7AM, please contact night-coverage [email or web address] Password [email or web address] 10/26/2017, 9:26 AM

## 2017-10-26 NOTE — Progress Notes (Signed)
Physical Therapy Treatment Patient Details Name: Chris RiversJerome A Radler Jr. MRN: [id number] DOB: 12/19/1965 Today's Date: 10/26/2017    History of Present Illness 52 yo AAM HIV AF COPD MO Admitted w/resp failure, VDRF. + Flu A/PNA; possible PE;  Baseline creatinine 0.79 10/01/17. AKI in setting of above issues. HDX1 3/4 for volume, markedly catabolic/recurrent [name]^K, massively overloaded. CRRT initiated 10/12/17 for massive anasarca /hyperkalemia. R IJ temp cath 3/3 by CCM.     PT Comments    Patient progressing with standing tolerance this session able able to demonstrate pushing up against gravity with LE's.  Feel that continued tilting with assist of nursing will improve overall strength as well as ability to wean from vent.  Pt motivated and works hard during PT sessions. Continue to feel he is appropriate for CIR level rehab if able to wean; otherwise would recommend LTACH.   Follow Up Recommendations  CIR;Supervision/Assistance - 24 hour(if able to wean, if not LTACH)     Equipment Recommendations  Other (comment)(TBA)    Recommendations for Other Services       Precautions / Restrictions Precautions Precautions: Fall Precaution Comments: on vent Restrictions Weight Bearing Restrictions: No Other Position/Activity Restrictions: stood with Vital Go bed with gradual elevation 20-30-40-55 degrees and vital signs monitored throughout, tolerated well    Mobility  Bed Mobility Overal bed mobility: Needs Assistance Bed Mobility: Rolling Rolling: +2 for physical assistance;Mod assist         General bed mobility comments: assist to flex L LE and reach across to rail to position on R side with cues and mod A  Transfers                    Ambulation/Gait                 Stairs            Wheelchair Mobility    Modified Rankin (Stroke Patients Only)       Balance Overall balance assessment: Needs assistance   Sitting balance-Leahy Scale: Poor Sitting balance  - Comments: pt in chair position upon arrival, able to right his trunk to midline upon request     Standing balance-Leahy Scale: Zero Standing balance comment: flexes knees into straps on vital go bed; able to activate LE's pushing down to attempt straightening knees with cues for brief episodes                            Cognition Arousal/Alertness: Awake/alert Behavior During Therapy: Flat affect Overall Cognitive Status: Difficult to assess                                 General Comments: appears intact cognitively      Exercises Total Joint Exercises Ankle Circles/Pumps: AROM;10 reps;Both;Seated Short Arc Quad: AROM;Both;Seated;10 reps General Exercises - Upper Extremity Shoulder Flexion: AAROM;Both;5 reps;Seated Elbow Flexion: Strengthening;Both;5 reps Elbow Extension: Strengthening;Both;5 reps;Seated General Exercises - Lower Extremity Ankle Circles/Pumps: AROM;10 reps;Both;Seated Long Arc Quad: AAROM;Both;5 reps;Seated Hip Flexion/Marching: 5 reps;Both;Seated;AAROM    General Comments General comments (skin integrity, edema, etc.): stood with Vital Go bed with gradual elevation 20-30-40-55 degrees and vital signs monitored throughout, tolerated well; standing total of about 25 min      Pertinent Vitals/Pain Pain Assessment: 0-10 Pain Score: 9  Pain Location: legs Pain Descriptors / Indicators: Discomfort Pain Intervention(s): Monitored during session;Repositioned;Patient requesting pain  meds-RN notified    Home Living                      Prior Function            PT Goals (current goals can now be found in the care plan section) Acute Rehab PT Goals Patient Stated Goal: to get better Progress towards PT goals: Progressing toward goals    Frequency    Min 3X/week      PT Plan Current plan remains appropriate    Co-evaluation PT/OT/SLP Co-Evaluation/Treatment: Yes Reason for Co-Treatment: Complexity of the  patient's impairments (multi-system involvement);Necessary to address cognition/behavior during functional activity;For patient/therapist safety PT goals addressed during session: Mobility/safety with mobility;Strengthening/ROM OT goals addressed during session: Strengthening/ROM      AM-PAC PT "6 Clicks" Daily Activity  Outcome Measure  Difficulty turning over in bed (including adjusting bedclothes, sheets and blankets)?: Unable Difficulty moving from lying on back to sitting on the side of the bed? : Unable Difficulty sitting down on and standing up from a chair with arms (e.g., wheelchair, bedside commode, etc,.)?: Unable Help needed moving to and from a bed to chair (including a wheelchair)?: Total Help needed walking in hospital room?: Total Help needed climbing 3-5 steps with a railing? : Total 6 Click Score: 6    End of Session Equipment Utilized During Treatment: Other (comment)(vent) Activity Tolerance: Patient tolerated treatment well Patient left: in bed;with nursing/sitter in room   PT Visit Diagnosis: Unsteadiness on feet (R26.81);Muscle weakness (generalized) (M62.81)     Time: 2440-1027 PT Time Calculation (min) (ACUTE ONLY): 44 min  Charges:  $Therapeutic Exercise: 8-22 mins $Therapeutic Activity: 8-22 mins                    G CodesSheran Lawless, [place] [phone] 10/26/2017    Elray Mcgregor 10/26/2017, 12:03 PM

## 2017-10-26 NOTE — Progress Notes (Signed)
Occupational Therapy Treatment Patient Details Name: Chris RiversJerome A Fluhr Jr. MRN: [id number] DOB: 12/24/1965 Today's Date: 10/26/2017    History of present illness 52 yo AAM HIV AF COPD MO Admitted w/resp failure, VDRF. + Flu A/PNA; possible PE;  Baseline creatinine 0.79 10/01/17. AKI in setting of above issues. HDX1 3/4 for volume, markedly catabolic/recurrent [name]^K, massively overloaded. CRRT initiated 10/12/17 for massive anasarca /hyperkalemia. R IJ temp cath 3/3 by CCM.    OT comments  Pt remains highly motivated to get stronger. Bed in chair position upon arrival. Worked on standing tolerance in Vital Go tilt bed with pt demonstrating stable vital signs with gradual elevation to 55 degrees. Pt performed B UE AA and strengthening exercises. He was able to right his trunk to midline in supported sitting. Pt wants to eat.Will continue to follow.  Follow Up Recommendations  CIR    Equipment Recommendations       Recommendations for Other Services      Precautions / Restrictions Precautions Precautions: Fall Precaution Comments: on vent Restrictions Weight Bearing Restrictions: No Other Position/Activity Restrictions: stood with Vital Go bed with gradual elevation 20-30-40-55 degrees and vital signs monitored throughout, tolerated well       Mobility Bed Mobility Overal bed mobility: Needs Assistance Bed Mobility: Rolling Rolling: +2 for physical assistance;Mod assist         General bed mobility comments: assist to flex L LE and reach across to rail to position on R side  Transfers                      Balance       Sitting balance - Comments: pt in chair position upon arrival, able to right his trunk to midline upon request                                   ADL either performed or assessed with clinical judgement   ADL                                         General ADL Comments: pt NPO, total assist required for ADL     Vision        Perception     Praxis      Cognition Arousal/Alertness: Awake/alert Behavior During Therapy: Flat affect Overall Cognitive Status: Difficult to assess                                 General Comments: appears intact cognitively        Exercises Exercises: General Upper Extremity General Exercises - Upper Extremity Shoulder Flexion: AAROM;Both;5 reps;Seated Elbow Flexion: Strengthening;Both;5 reps Elbow Extension: Strengthening;Both;5 reps;Seated   Shoulder Instructions       General Comments      Pertinent Vitals/ Pain       Pain Assessment: 0-10 Pain Score: 9  Pain Location: legs Pain Descriptors / Indicators: Discomfort Pain Intervention(s): Monitored during session;Patient requesting pain meds-RN notified;Repositioned  Home Living                                          Prior Functioning/Environment  Frequency  Min 2X/week        Progress Toward Goals  OT Goals(current goals can now be found in the care plan section)  Progress towards OT goals: Progressing toward goals  Acute Rehab OT Goals Patient Stated Goal: to get better OT Goal Formulation: Patient unable to participate in goal setting Time For Goal Achievement: 11/05/17 Potential to Achieve Goals: Good  Plan Discharge plan remains appropriate    Co-evaluation    PT/OT/SLP Co-Evaluation/Treatment: Yes Reason for Co-Treatment: Complexity of the patient's impairments (multi-system involvement);For patient/therapist safety   OT goals addressed during session: Strengthening/ROM      AM-PAC PT "6 Clicks" Daily Activity     Outcome Measure   Help from another person eating meals?: Total Help from another person taking care of personal grooming?: Total Help from another person toileting, which includes using toliet, bedpan, or urinal?: Total Help from another person bathing (including washing, rinsing, drying)?: Total Help from another  person to put on and taking off regular upper body clothing?: Total Help from another person to put on and taking off regular lower body clothing?: Total 6 Click Score: 6    End of Session Equipment Utilized During Treatment: Other (comment)(vent)  OT Visit Diagnosis: Muscle weakness (generalized) (M62.81)   Activity Tolerance Patient tolerated treatment well   Patient Left in bed;with call bell/phone within reach;with nursing/sitter in room   Nurse Communication Mobility status        Time: 1610-9604 OT Time Calculation (min): 44 min  Charges: OT General Charges $OT Visit: 1 Visit OT Treatments $Therapeutic Activity: 8-22 mins  10/26/2017 [name], OTR/L Pager: [phone]  Iran Planas [place] Bailiff 10/26/2017, 10:45 AM

## 2017-10-26 NOTE — Progress Notes (Signed)
CSW continues to follow pt for placement needs. CSW aware that pt remains on vent at 60 percent (to hight for pt to go to vent/snf) at this time. CSW also aware that RNCM has submitted insurance auth to see [facility]. CSW to continue to follow at this time.    Claude MangesKierra S. Jakyrah Holladay, MSW, LCSW-A Emergency Department Clinical Social Worker [phone]

## 2017-10-26 NOTE — Progress Notes (Signed)
PULMONARY / CRITICAL CARE MEDICINE   Name: Chris RiversJerome A Hor [name]. MRN: [id number] DOB: 03/02/1966    ADMISSION DATE:  10/01/2017 CONSULTATION DATE:  2/21  REFERRING MD:  Juleen ChinaKohut  CHIEF COMPLAINT:  SOB  HISTORY OF PRESENT ILLNESS:   52 yo male presented with dyspnea, cough, fever, hypoxia.  Found to have Influenza PNA causing ARDS.  Course complicated by hypervolemia and renal failure.  PMHx of HIV, A fib, COPD, DM.   SUBJECTIVE:  Fusing dialysis today VITAL SIGNS: BP (!) 190/83   Pulse 89   Temp 98.5 F (36.9 C) (Oral)   Resp (!) 29   Ht 5\' 9"  (1.753 m)   Wt 260 lb 2.3 oz (118 kg)   SpO2 97%   BMI 38.42 kg/m    HEMODYNAMICS:    VENTILATOR SETTINGS: Vent Mode: PCV FiO2 (%):  [40 %-60 %] 40 % Set Rate:  [16 bmp] 16 bmp PEEP:  [5 cmH20] 5 cmH20 Plateau Pressure:  [13 cmH20-22 cmH20] 13 cmH20  INTAKE / OUTPUT: I/O last 3 completed shifts: In: 4050 [I.V.:1715; NG/GT:2335] Out: 401 [Urine:401]  PHYSICAL EXAMINATION: General this is some acutely ill 52 year old African-American male currently supported on full ventilator support. HEENT: He has a size 6 cuffed tracheostomy currently in good position no clear air leak no significant secretions dressing intact Pulmonary: Coarse scattered rhonchi, no accessory use Cardiac: Regular rate and rhythm Abdomen: Soft, distended, positive bowel sounds, tolerating feeds. Extremities/musculoskeletal: Diffuse anasarca.  Warm, dry, strong pulses.  Next neuro/psych.  Awake, moving extremities.  Refusing therapy.  LABS:  BMET Recent Labs  Lab 10/23/17 0402 10/24/17 0509 10/25/17 0338  NA 131* 131* 131*  K 5.5* 4.2 4.5  CL 94* 94* 94*  CO2 23 25 24   BUN 119* 58* 81*  CREATININE 5.27* 4.04* 5.60*  GLUCOSE 107* 60* 123*   Electrolytes Recent Labs  Lab 10/23/17 0402 10/24/17 0509 10/25/17 0338  CALCIUM 8.5* 8.6* 8.7*  PHOS 10.7* 6.7* 9.1*   CBC Recent Labs  Lab 10/23/17 0402 10/24/17 0509 10/25/17 0338  WBC 11.8* 11.5*  11.4*  HGB 7.3* 8.4* 8.1*  HCT 23.6* 26.8* 26.1*  PLT 259 325 321   Coag's Recent Labs  Lab 10/24/17 0509 10/25/17 0338 10/26/17 0849  INR 1.29 1.35 1.40    Sepsis Markers No results for input(s): LATICACIDVEN, PROCALCITON, O2SATVEN in the last 168 hours.  ABG Recent Labs  Lab 10/19/17 2201  PHART 7.380  PCO2ART 44.4  PO2ART 67.3*    Liver Enzymes Recent Labs  Lab 10/23/17 0402 10/24/17 0509 10/25/17 0338  ALBUMIN 2.1* 2.1* 2.1*    Cardiac Enzymes No results for input(s): TROPONINI, PROBNP in the last 168 hours.  Glucose Recent Labs  Lab 10/25/17 1648 10/25/17 1947 10/25/17 2359 10/26/17 0349 10/26/17 0736 10/26/17 1158  GLUCAP 102* 150* 132* 201* 238* 156*    Imaging No results found.  STUDIES:  Echo 2/25 >> EF 60 to 65%, mild MR Renal u/s 2/28 >> slight fullness of renal collecting system  CULTURES: Respiratory viral panel 2/21 >> Influenza A H3 Blood 2/21 >> negative Sputum 2/22 >> oral flora Urine 2/26 >> negative Sputum 2/26 >> oral flora Blood 2/27 >> negative Sputum 3/5>>> oral flora Blood 3/6>>> negative  ANTIBIOTICS: Vancomycin 2/21 >>2/22 Cefepime 2/21 >> 2/28 Tamiflu 2/21 >> 2/26 Fluconazole 2/21 >> 2/27 Anidulafungin 2/27 >>3/6 Zithromax 2/27 >> 3/1  SIGNIFICANT EVENTS: 2/21 Admit 2/22 VDRF 2/27 ID consulted for persistent fever 2/28 Renal consult for AKI, Cardiology consult for A fib  with RVR 3/01 Trach 3/03 Attempted HD 3/04 Changed to CRRT 3/05 Worsening hypoxia >> vent settings adjusted 3/12 CRRT stopped  3/13 first iHD; PSV x 8 hrs  LINES/TUBES: ETT 2/22 >> 3/1 Left IJ 2/27 >>> Trach 3/01 >>> R IJ HD cath 3/03 >>>    ASSESSMENT / PLAN:  Acute hypoxic/hypercapnic respiratory failure from Influenza PNA. Hx of COPD with emphysema. Purulent tracheobronchitis. A fib with RVR. Acute on chronic diastolic CHF with hx of HTN. Hypotension  Acute renal failure 2nd to ATN- remains anuric Hyperkalemia. Anemia  of critical illness and chronic disease Esophageal candidiasis. Hx of HIV. DM type II. Acute metabolic encephalopathy >> improved 3/09.   DISCUSSION:  52 year old male patient with a history of HIV, atrial fibrillation, chronic obstructive pulmonary disease admitted initially for acute hypoxic respiratory failure in the setting of influenza and resultant ARDS.  Ileostomy was placed on 3/1, major barriers have been acute on chronic renal failure, persistent encephalopathy, and deconditioning following prolonged critical illness.  CRRT was stopped on 312 and he is now on intermittent dialysis Interval from last critical care visit, 3/12 to  3/18: -Completed prednisone taper on 3/12, 7-day course of Zosyn ended as well. -3/13: Pressure support ventilation Tolerated for almost 8 hours -Vas-Cath placed 3/16 -Last note from nephrology still hopeful for renal recovery but not making much urine -desaturated last night 3/17 ? Volume overload ? ? Anxiety related.  -3/18 refusing dialysis  Plan/rec Wean FIO2 as able No change on BDs Repeat CXR today Volume removal per nephrology Hopefully resume daily ATC trials 3/19 OOB daily Cont PT/OT Will consider trach w/ subglottal tube to facilitate communication Cont antiretroviral Cont rate control and systemic AC Will see again on 3/19  Simonne Martinet ACNP-BC [facility] Pulmonary/Critical Care Pager # [phone] OR # [phone] if no answer      I

## 2017-10-27 ENCOUNTER — Inpatient Hospital Stay (HOSPITAL_COMMUNITY): Payer: Medicare HMO

## 2017-10-27 LAB — PROTIME-INR
INR: 1.59
PROTHROMBIN TIME: 18.8 s — AB (ref 11.4–15.2)

## 2017-10-27 LAB — HEPARIN LEVEL (UNFRACTIONATED): Heparin Unfractionated: 0.55 IU/mL (ref 0.30–0.70)

## 2017-10-27 LAB — HIV-1 RNA QUANT-NO REFLEX-BLD
HIV 1 RNA Quant: <20 copies/mL
LOG10 HIV-1 RNA: UNDETERMINED log10copy/mL

## 2017-10-27 LAB — CBC
HCT: 24.2 % — ABNORMAL LOW (ref 39.0–52.0)
Hemoglobin: 7.8 g/dL — ABNORMAL LOW (ref 13.0–17.0)
MCH: 29.8 pg (ref 26.0–34.0)
MCHC: 32.2 g/dL (ref 30.0–36.0)
MCV: 92.4 fL (ref 78.0–100.0)
Platelets: 328 10*3/uL (ref 150–400)
RBC: 2.62 MIL/uL — ABNORMAL LOW (ref 4.22–5.81)
RDW: 16.7 % — AB (ref 11.5–15.5)
WBC: 9.1 10*3/uL (ref 4.0–10.5)

## 2017-10-27 LAB — RENAL FUNCTION PANEL
ALBUMIN: 2.3 g/dL — AB (ref 3.5–5.0)
ANION GAP: 20 — AB (ref 5–15)
BUN: 131 mg/dL — ABNORMAL HIGH (ref 6–20)
CO2: 21 mmol/L — ABNORMAL LOW (ref 22–32)
Calcium: 8.9 mg/dL (ref 8.9–10.3)
Chloride: 84 mmol/L — ABNORMAL LOW (ref 101–111)
Creatinine, Ser: 7.64 mg/dL — ABNORMAL HIGH (ref 0.61–1.24)
GFR calc Af Amer: 8 mL/min — ABNORMAL LOW (ref >60)
GFR, EST NON AFRICAN AMERICAN: 7 mL/min — AB (ref >60)
GLUCOSE: 201 mg/dL — AB (ref 65–99)
PHOSPHORUS: 10 mg/dL — AB (ref 2.5–4.6)
POTASSIUM: 6.8 mmol/L — AB (ref 3.5–5.1)
Sodium: 125 mmol/L — ABNORMAL LOW (ref 135–145)

## 2017-10-27 LAB — GLUCOSE, CAPILLARY
GLUCOSE-CAPILLARY: 203 mg/dL — AB (ref 65–99)
GLUCOSE-CAPILLARY: 106 mg/dL — AB (ref 65–99)
GLUCOSE-CAPILLARY: 87 mg/dL (ref 65–99)
GLUCOSE-CAPILLARY: 92 mg/dL (ref 65–99)
Glucose-Capillary: 167 mg/dL — ABNORMAL HIGH (ref 65–99)

## 2017-10-27 LAB — MAGNESIUM: MAGNESIUM: 3.2 mg/dL — AB (ref 1.7–2.4)

## 2017-10-27 LAB — T-HELPER CELLS (CD4) COUNT (NOT AT ARMC)
CD4 T CELL HELPER: 5 % — AB (ref 33–55)
CD4 T Cell Abs: 40 /uL — ABNORMAL LOW (ref 400–2700)

## 2017-10-27 MED ORDER — HEPARIN (PORCINE) IN NACL 100-0.45 UNIT/ML-% IJ SOLN
2200.0000 [IU]/h | INTRAMUSCULAR | Status: DC
  Filled 2017-10-27 (×2): qty 250

## 2017-10-27 MED ORDER — PANCRELIPASE (LIP-PROT-AMYL) 12000-38000 UNITS PO CPEP
24000.0000 [IU] | ORAL_CAPSULE | Freq: Once | ORAL | Status: AC
  Administered 2017-10-27: 24000 [IU] via ORAL
  Filled 2017-10-27: qty 2

## 2017-10-27 MED ORDER — INSULIN GLARGINE 100 UNIT/ML ~~LOC~~ SOLN
10.0000 [IU] | Freq: Once | SUBCUTANEOUS | Status: DC
  Filled 2017-10-27: qty 0.1

## 2017-10-27 MED ORDER — SODIUM POLYSTYRENE SULFONATE 15 GM/60ML PO SUSP
30.0000 g | Freq: Once | ORAL | Status: AC
  Administered 2017-10-27: 30 g
  Filled 2017-10-27: qty 120

## 2017-10-27 MED ORDER — SODIUM POLYSTYRENE SULFONATE 15 GM/60ML PO SUSP
30.0000 g | Freq: Once | ORAL | Status: AC
  Administered 2017-10-27: 30 g via ORAL
  Filled 2017-10-27: qty 120

## 2017-10-27 MED ORDER — HEPARIN (PORCINE) IN NACL 100-0.45 UNIT/ML-% IJ SOLN
2200.0000 [IU]/h | INTRAMUSCULAR | Status: DC
  Administered 2017-10-27 (×2): 2200 [IU]/h via INTRAVENOUS
  Filled 2017-10-27 (×2): qty 250

## 2017-10-27 MED ORDER — SODIUM BICARBONATE 650 MG PO TABS
650.0000 mg | ORAL_TABLET | Freq: Once | ORAL | Status: AC
  Administered 2017-10-27: 650 mg via ORAL

## 2017-10-27 MED ORDER — INSULIN ASPART 100 UNIT/ML IV SOLN
10.0000 [IU] | Freq: Once | INTRAVENOUS | Status: AC
  Administered 2017-10-27: 10 [IU] via INTRAVENOUS

## 2017-10-27 MED ORDER — DEXTROSE 50 % IV SOLN
1.0000 | Freq: Once | INTRAVENOUS | Status: AC
  Administered 2017-10-27: 50 mL via INTRAVENOUS
  Filled 2017-10-27: qty 50

## 2017-10-27 MED ORDER — WARFARIN SODIUM 7.5 MG PO TABS
15.0000 mg | ORAL_TABLET | Freq: Once | ORAL | Status: AC
  Administered 2017-10-27: 15 mg via ORAL
  Filled 2017-10-27: qty 2

## 2017-10-27 NOTE — Progress Notes (Signed)
PULMONARY / CRITICAL CARE MEDICINE   Name: Chris [name]. MRN: [id number] DOB: 18-Nov-1965    ADMISSION DATE:  10/01/2017 CONSULTATION DATE:  2/21  REFERRING MD:  Juleen China  CHIEF COMPLAINT:  SOB  HISTORY OF PRESENT ILLNESS:   52 yo male presented with dyspnea, cough, fever, hypoxia.  Found to have Influenza PNA causing ARDS.  Course complicated by hypervolemia and renal failure.  PMHx of HIV, A fib, COPD, DM.   SUBJECTIVE:  Still refusing HD VITAL SIGNS: BP (!) 156/78   Pulse 88   Temp 98.2 F (36.8 C) (Oral)   Resp 19   Ht 5\' 9"  (1.753 m)   Wt 247 lb 12.8 oz (112.4 kg)   SpO2 99%   BMI 36.59 kg/m   HEMODYNAMICS:    VENTILATOR SETTINGS: Vent Mode: PSV;CPAP FiO2 (%):  [40 %] 40 % Set Rate:  [16 bmp] 16 bmp PEEP:  [5 cmH20] 5 cmH20 Pressure Support:  [5 cmH20] 5 cmH20 Plateau Pressure:  [17 cmH20-18 cmH20] 18 cmH20  INTAKE / OUTPUT: I/O last 3 completed shifts: In: 4337 [I.V.:1767; NG/GT:2570] Out: 451 [Urine:451]  PHYSICAL EXAMINATION: General: 52 year old male patient resting comfortably on full ventilator support HEENT: He does have bloody drainage from around trach stoma otherwise normocephalic Pulmonary: Scattered rhonchi no accessory use Cardiac: Regular rate and rhythm Abdomen: Soft nontender Extremities/musculoskeletal: Diffuse anasarca warm brisk cap refill strong pulses Neuro/psych: Endorses that he is indeed depressed, generalized weakness. LABS:  BMET Recent Labs  Lab 10/24/17 0509 10/25/17 0338 10/27/17 0411  NA 131* 131* 125*  K 4.2 4.5 6.8*  CL 94* 94* 84*  CO2 25 24 21*  BUN 58* 81* 131*  CREATININE 4.04* 5.60* 7.64*  GLUCOSE 60* 123* 201*   Electrolytes Recent Labs  Lab 10/24/17 0509 10/25/17 0338 10/27/17 0411  CALCIUM 8.6* 8.7* 8.9  MG  --   --  3.2*  PHOS 6.7* 9.1* 10.0*   CBC Recent Labs  Lab 10/24/17 0509 10/25/17 0338 10/27/17 0411  WBC 11.5* 11.4* 9.1  HGB 8.4* 8.1* 7.8*  HCT 26.8* 26.1* 24.2*  PLT 325 321  328   Coag's Recent Labs  Lab 10/25/17 0338 10/26/17 0849 10/27/17 0411  INR 1.35 1.40 1.59    Sepsis Markers No results for input(s): LATICACIDVEN, PROCALCITON, O2SATVEN in the last 168 hours.  ABG No results for input(s): PHART, PCO2ART, PO2ART in the last 168 hours.  Liver Enzymes Recent Labs  Lab 10/24/17 0509 10/25/17 0338 10/27/17 0411  ALBUMIN 2.1* 2.1* 2.3*    Cardiac Enzymes No results for input(s): TROPONINI, PROBNP in the last 168 hours.  Glucose Recent Labs  Lab 10/26/17 1633 10/26/17 1955 10/26/17 2329 10/27/17 0402 10/27/17 0818 10/27/17 1205  GLUCAP 113* 123* 153* 203* 167* 106*    Imaging Dg Abd Portable 1v  Result Date: 10/27/2017 CLINICAL DATA:  Check feeding catheter placement EXAM: PORTABLE ABDOMEN - 1 VIEW COMPARISON:  None. FINDINGS: Nasogastric catheter is noted extending into the stomach. The proximal side port is noted at the level of the GE junction. This should be advanced several cm. IMPRESSION: Nasogastric catheter as described. This should be advanced several cm. Electronically Signed   By: Alcide Clever M.D.   On: 10/27/2017 12:18    STUDIES:  Echo 2/25 >> EF 60 to 65%, mild MR Renal u/s 2/28 >> slight fullness of renal collecting system  CULTURES: Respiratory viral panel 2/21 >> Influenza A H3 Blood 2/21 >> negative Sputum 2/22 >> oral flora Urine 2/26 >> negative  Sputum 2/26 >> oral flora Blood 2/27 >> negative Sputum 3/5>>> oral flora Blood 3/6>>> negative  ANTIBIOTICS: Vancomycin 2/21 >>2/22 Cefepime 2/21 >> 2/28 Tamiflu 2/21 >> 2/26 Fluconazole 2/21 >> 2/27 Anidulafungin 2/27 >>3/6 Zithromax 2/27 >> 3/1  SIGNIFICANT EVENTS: 2/21 Admit 2/22 VDRF 2/27 ID consulted for persistent fever 2/28 Renal consult for AKI, Cardiology consult for A fib with RVR 3/01 Trach 3/03 Attempted HD 3/04 Changed to CRRT 3/05 Worsening hypoxia >> vent settings adjusted 3/12 CRRT stopped  3/13 first iHD; PSV x 8  hrs  LINES/TUBES: ETT 2/22 >> 3/1 Left IJ 2/27 >>> Trach 3/01 >>> R IJ HD cath 3/03 >>>    ASSESSMENT / PLAN:  Acute hypoxic/hypercapnic respiratory failure from Influenza PNA. Hx of COPD with emphysema. Purulent tracheobronchitis. A fib with RVR. Acute on chronic diastolic CHF with hx of HTN. Hypotension  Acute renal failure 2nd to ATN- remains anuric Hyperkalemia. Anemia of critical illness and chronic disease Esophageal candidiasis. Hx of HIV. DM type II. Acute metabolic encephalopathy >> improved 3/09. Hyperkalemia  Depression   DISCUSSION:  52 year old male patient with a history of HIV, atrial fibrillation, chronic obstructive pulmonary disease admitted initially for acute hypoxic respiratory failure in the setting of influenza and resultant ARDS.  Ileostomy was placed on 3/1, major barriers have been acute on chronic renal failure, persistent encephalopathy, and deconditioning following prolonged critical illness.  CRRT was stopped on 312 and he is now on intermittent dialysis Interval from last critical care visit, 3/12 to  3/18: -Completed prednisone taper on 3/12, 7-day course of Zosyn ended as well. -3/13: Pressure support ventilation Tolerated for almost 8 hours -Vas-Cath placed 3/16 -Last note from nephrology still hopeful for renal recovery but not making much urine -desaturated last night 3/17 ? Volume overload ? ? Anxiety related.  -3/18 refusing dialysis and again on 3/19  Plan/rec Wean FIO2 Volume removal w/ dialysis Cont BDs CXR in am  PT/OT Daily assessment for wean but if refusing HD we will be unlikely to be successful, I think he needs psych eval -->is depression complicating his ability to make sound decisions?  OOB, cont PT and OT efforts Cont antiretroviral's    Simonne MartinetPeter E Babcock ACNP-BC [facility] Pulmonary/Critical Care Pager # [phone] OR # [phone] if no answer      I

## 2017-10-27 NOTE — Progress Notes (Signed)
ANTICOAGULATION CONSULT NOTE - Follow Up Consult  Pharmacy Consult:  Heparin/warfarin Indication: pulmonary embolus/Afib  Allergies  Allergen Reactions  . Bactrim [Sulfamethoxazole-Trimethoprim] Hives    Patient Measurements: Height: 5\' 9"  (175.3 cm) Weight: 247 lb 12.8 oz (112.4 kg) IBW/kg (Calculated) : 70.7 Heparin dosing weight: 97 kg  Vital Signs: Temp: 98 F (36.7 C) (03/19 1541) Temp Source: Oral (03/19 1541) BP: 158/80 (03/19 1630) Pulse Rate: 87 (03/19 1630)  Labs: Recent Labs    10/25/17 0338 10/26/17 0849 10/27/17 0411  HGB 8.1*  --  7.8*  HCT 26.1*  --  24.2*  PLT 321  --  328  LABPROT 16.6* 17.0* 18.8*  INR 1.35 1.40 1.59  HEPARINUNFRC 0.44 0.48 0.55  CREATININE 5.60*  --  7.64*    Estimated Creatinine Clearance: 14.1 mL/min (A) (by C-G formula based on SCr of 7.64 mg/dL (H)).  Goal of Therapy:  INR 2-3  Heparin level 0.3-0.5 units/mL Monitor platelets by anticoagulation protocol: Yes  UPDATE:  Patient continuing to have bleeding despite holding heparin for ~2 hours. Bleeding stabilized around 1800 when talking with nursing. Okay to give warfarin tonight. Retimed heparin infusion to start at 2200 units/hr at 1830 and check level in 8 hours. If nursing notices bleeding worsens, informed to call pharmacy and will determine plan.  Plan: Resume heparin infusion at 2200 units/hr at 1830 on 3/19, then reassess bleeding site -RN to call pharmacy if bleeding changes/worsens   New heparin goal 0.3-0.5 due to bleeding  Check heparin level 8 hours after heparin restart   Give warfarin 15 mg PO x 1 tonight Monitor daily heparin level, INR, CBC, s/sx of bleeding  Girard CooterKimberly Perkins, PharmD Clinical Pharmacist  Pager: [phone] Phone: [phone] 10/27/2017 5:55 PM
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
## 2017-10-27 NOTE — Plan of Care (Signed)
VS stable. Refused HD again today, for second day in a row. Bleeding from old HD cath site, heparin gtt stopped by Pharmacy for now. Continues to wean from vent.

## 2017-10-27 NOTE — Progress Notes (Signed)
Background: 52 yo AAM HIV AF COPD MO Admitted w/resp failure, VDRF. + Flu A/PNA; possible PE; Baseline creatinine 0.79 10/01/17. AKI in setting of above issues. CRRT initiated 10/12/17 for massive anasarca /hyperkalemia. R IJ temp cath 3/3 by CCM.   Assessment/Recommendations 1. AKI - normal creatinine at baseline (0.79). CRRT 3/4-3/12. Refused IHD 2 days in a row 2. VDRF/COPD/ARDS/FluA+ - finished Tamiflu/ATB's; on steroids - weaning. Per CCM   3. HIV - meds; ID following 4. Anemia- ESA and PRBCs 5. Hyperkalemic s/p 60 grams kayexalate(discussed gravity of high K with wife and pt)   Subjective: Interval History: High K and refusing treatment  Objective: Vital signs in last 24 hours: Temp:  [98 F (36.7 C)-98.4 F (36.9 C)] 98 F (36.7 C) (03/19 1541) Pulse Rate:  [71-89] 89 (03/19 1600) Resp:  [14-24] 18 (03/19 1600) BP: (135-173)/(54-80) 158/80 (03/19 1600) SpO2:  [90 %-99 %] 91 % (03/19 1600) FiO2 (%):  [40 %] 40 % (03/19 1341) Weight:  [112.4 kg (247 lb 12.8 oz)] 112.4 kg (247 lb 12.8 oz) (03/19 0418) Weight change: -5.6 kg (-5.5 oz)  Intake/Output from previous day: 03/18 0701 - 03/19 0700 In: 2969 [I.V.:1179; NG/GT:1790] Out: 450 [Urine:450] Intake/Output this shift: Total I/O In: 965.3 [I.V.:280.3; NG/GT:685] Out: 250 [Urine:250]  Head: Normocephalic, without obvious abnormality, atraumatic GI: protuberant  Ext 2= edema trached   Lab Results: Recent Labs    10/25/17 0338 10/27/17 0411  WBC 11.4* 9.1  HGB 8.1* 7.8*  HCT 26.1* 24.2*  PLT 321 328   BMET:  Recent Labs    10/25/17 0338 10/27/17 0411  NA 131* 125*  K 4.5 6.8*  CL 94* 84*  CO2 24 21*  GLUCOSE 123* 201*  BUN 81* 131*  CREATININE 5.60* 7.64*  CALCIUM 8.7* 8.9   No results for input(s): PTH in the last 72 hours. Iron Studies: No results for input(s): IRON, TIBC, TRANSFERRIN, FERRITIN in the last 72 hours. Studies/Results: Dg Abd Portable 1v  Result Date: 10/27/2017 CLINICAL  DATA:  Check feeding catheter placement EXAM: PORTABLE ABDOMEN - 1 VIEW COMPARISON:  None. FINDINGS: Nasogastric catheter is noted extending into the stomach. The proximal side port is noted at the level of the GE junction. This should be advanced several cm. IMPRESSION: Nasogastric catheter as described. This should be advanced several cm. Electronically Signed   By: Inez Catalina M.D.   On: 10/27/2017 12:18    Scheduled: . amiodarone  200 mg Per Tube Daily  . chlorhexidine gluconate (MEDLINE KIT)  15 mL Mouth Rinse BID  . Chlorhexidine Gluconate Cloth  6 each Topical Daily  . clonazePAM  0.25 mg Per Tube Daily  . clonazePAM  0.5 mg Per Tube QHS  . diltiazem  60 mg Per Tube Q6H  . docusate  100 mg Per Tube BID  . dolutegravir  50 mg Oral Daily  . emtricitabine  200 mg Oral Once every 4 days  . famotidine  20 mg Per Tube QHS  . feeding supplement (VITAL HIGH PROTEIN)  1,000 mL Per Tube Q24H  . insulin aspart  0-20 Units Subcutaneous Q4H  . insulin glargine  20 Units Subcutaneous BID  . ipratropium-albuterol  3 mL Nebulization TID  . mouth rinse  15 mL Mouth Rinse QID  . methylPREDNISolone (SOLU-MEDROL) injection  60 mg Intravenous Q24H  . polyethylene glycol  17 g Oral Daily  . scopolamine  1 patch Transdermal Q72H  . sennosides  5 mL Per Tube QHS  . sevelamer carbonate  2.4 g Per Tube TID WC  . sodium chloride flush  10-40 mL Intracatheter Q12H  . sodium chloride flush  3 mL Intravenous Q12H  . tenofovir  300 mg Oral Q Wed  . warfarin  15 mg Oral ONCE-1800  . Warfarin - Pharmacist Dosing Inpatient   Does not apply q1800     LOS: 26 days   Estanislado Emms 10/27/2017,4:44 PM

## 2017-10-27 NOTE — Progress Notes (Signed)
ANTICOAGULATION CONSULT NOTE - Follow Up Consult  Pharmacy Consult:  Heparin/warfarin Indication: pulmonary embolus/Afib  Allergies  Allergen Reactions  . Bactrim [Sulfamethoxazole-Trimethoprim] Hives    Patient Measurements: Height: 5\' 9"  (175.3 cm) Weight: 247 lb 12.8 oz (112.4 kg) IBW/kg (Calculated) : 70.7 Heparin dosing weight: 97 kg  Vital Signs: Temp: 98.2 F (36.8 C) (03/19 1208) Temp Source: Oral (03/19 1208) BP: 156/78 (03/19 1214) Pulse Rate: 88 (03/19 1214)  Labs: Recent Labs    10/25/17 0338 10/26/17 0849 10/27/17 0411  HGB 8.1*  --  7.8*  HCT 26.1*  --  24.2*  PLT 321  --  328  LABPROT 16.6* 17.0* 18.8*  INR 1.35 1.40 1.59  HEPARINUNFRC 0.44 0.48 0.55  CREATININE 5.60*  --  7.64*    Estimated Creatinine Clearance: 14.1 mL/min (A) (by C-G formula based on SCr of 7.64 mg/dL (H)).  Assessment: [age] YOM with Afib on Eliquis PTA, transitioned to warfarin given possible PE on Eliquis, successfully completed heparin bridge to INR > 2, however patient then required tunneled HD catheter placement with INR < 1.5 so warfarin was held x 4 days. S/p IR procedure 3/16 warfarin has been resumed with heparin bridge until INR > 2.  HgB continues trending down to 7.8.  PLTs are WNL and stable. Today patient began bleeding from an old line site, went through 2 dressings in ~2.5 hours. Will plan to hold heparin, but will continue warfarin given subtherapeutic INR. Discussed plan with RN and attending physician.  Heparin level was therapeutic this morning 0.55, trending up since yesterday. INR below goal at 1.59.  Goal of Therapy:  INR 2-3  Heparin level 0.3-0.5 units/mL Monitor platelets by anticoagulation protocol: Yes   Plan: Hold heparin for ~2 hours this afternoon, then reassess bleeding site -IF bleeding is improved, restart heparin at lower rate 2200 units/hr -RN to call pharmacy if bleeding is unchanged/worse  New heparin goal 0.3-0.5 due to bleeding  Check  heparin level 8 hours after heparin restart  Give warfarin 15 mg PO x 1 tonight Monitor daily heparin level, INR, CBC, s/sx of bleeding   Al CorpusLindsey Roshan Roback, PharmD PGY1 Pharmacy Resident CCM pharmacy phone: [phone] Pager: [phone] 10/27/2017 1:25 PM

## 2017-10-27 NOTE — Progress Notes (Signed)
PROGRESS NOTE    Chris [name].  RWE:[id number] DOB: July 05, 1966 DOA: 10/01/2017 PCP: System, Pcp Not In   Brief Narrative:  52 yo BM PMHx Depression HIV, A fib, COPD, and DM Type 2 without complication  Presented with dyspnea, cough, fever, and hypoxia and was found to have Influenza PNA causing ARDS. His hospital course has been complicated by hypervolemia and renal failure.     Subjective: 3/19 Alert, depressed. Nods yes and no to questions. States will not take HD today(refused HD yesterday).Negative CP. Negative SOB. Negative abdominal pain.   Assessment & Plan:   Principal Problem:   Acute on chronic respiratory failure with hypoxia (HCC) Active Problems:   COPD exacerbation (HCC)   Diabetes mellitus type 2 in obese (Chris [name])   COPD with exacerbation (HCC)   Atrial fibrillation (HCC)   HIV (human immunodeficiency virus infection) (Chris [name])   Tobacco abuse   Bronchitis   AKI (acute kidney injury) (Chris [name])   Endotracheally intubated   Influenza, pneumonia   Atrial fibrillation with RVR (HCC)   Elevated troponin   Goals of care, counseling/discussion   Palliative care by specialist   Palliative care encounter  Acute respiratory failure with hypoxia and hypercapnia/ARDS secondary positive influenza Pneumonia -failed weaning trial. Now with trach -3/17 did not tolerate SBT today extremely weak. -SBT daily: Currently patient not participating.Appears depressed -scopolamine patch has reduced secretions significantly.   COPD/Emphysema   -DuoNeb TID -solu-Medrol 60 mg daily  Purulent Tracheobronchitis -Completed course of antibiotics no ongoing evidence of infection  A. Fib with RVR -Currently in NSR -Amiodarone 200 mg daily -Cardizem 60 mg QID -Coumadin per pharmacy Recent Labs  Lab 10/21/17 0313 10/22/17 0524 10/23/17 0402 10/24/17 0509 10/25/17 0338 10/26/17 0849 10/27/17 0411  INR 2.97 1.94 1.48 1.29 1.35 1.40 1.59  -currently subtherapeutic. Continue heparin  Bridge until therapeutic  Acute on Chronic diastolic CHF -Strict in and outince admission +3.0 L -Daily weight  Filed Weights   10/25/17 0500 10/26/17 0500 10/27/17 0418  Weight: 248 lb 10.9 oz (112.8 kg) 260 lb 2.3 oz (118 kg) 247 lb 12.8 oz (112.4 kg)  -volume management per HD -transfuse for hemoglobin< 8   Hypotension  Resolved - BP stable    Acute renal failure (baseline Cr 0.79) -ATN   -intermittent  HD per Nephrology -patient currently refusing HD. Counseled at length patient that continued refusal of HD would result in his DEATH. Patient stated and nodded that he understood this but still refused HD -consulted psychiatry does patient have capacity to make medical decisions?  Hyperkalemia -Kayexalate 60 g  Anemia of Critical illness/Chronic disease  Recent Labs  Lab 10/21/17 0313 10/22/17 0524 10/23/17 0402 10/24/17 0509 10/25/17 0338 10/27/17 0411  HGB 7.6* 7.7* 7.3* 8.4* 8.1* 7.8*  -stable   Esophageal Candidiasis -Complete course of treatment    HIV -considering patient treated for esophageal candidiasis, will obtain updated CD4 count good possibility< 200 -HIV RNA quant pending -continue Emtriva + Tivicay    DM 2 uncontrolled with complication -6/19 Hemoglobin A1c=10.5 -Lantus 20 units BID -resistant SSI   Acute metabolic encephalopathy -appears improved but difficult to tell patient on trach. -uremia most likely contributing. Hopefully will imve with HD -Resolved  Dysphagia -requested Swallow study 3/18   DVT prophylaxis: heparin drip Code Status: Full Family Communication: None Disposition Plan: TBD   Consultants:  Nephrology Radiology    Procedures/Significant Events:  2/21 Admit 2/22 Intubated 2/27 ID consulted for persistent fever 2/28 Renal consult for AKI, Cardiology consult  for A fib with RVR 3/01 Trach 3/03 R IJ HD cath - attempted HD 3/04 Changed to CRRT 3/05 Worsening hypoxia >> vent settings adjusted 3/12 CRRT  stopped  3/13 first iHD; PSV x 8 hrs    I have personally reviewed and interpreted all radiology studies and my findings are as above.  VENTILATOR SETTINGS:    Cultures   Antimicrobials: Anti-infectives (From admission, onward)   Start     Stop   10/24/17 2000  emtricitabine (EMTRIVA) capsule 200 mg         10/24/17 1018  ceFAZolin (ANCEF) 2-4 GM/100ML-% IVPB    Comments:  Chris [name]   : cabinet override   10/24/17 1129   10/21/17 2000  tenofovir (VIREAD) tablet 300 mg         10/16/17 1200  piperacillin-tazobactam (ZOSYN) IVPB 3.375 g     10/20/17 1751   10/16/17 1000  emtricitabine (EMTRIVA) capsule 200 mg  Status:  Discontinued     10/21/17 0638   10/15/17 0900  vancomycin (VANCOCIN) IVPB 1000 mg/200 mL premix  Status:  Discontinued     10/15/17 1015   10/14/17 1200  piperacillin-tazobactam (ZOSYN) IVPB 3.375 g  Status:  Discontinued     10/16/17 0801   10/14/17 1000  tenofovir (VIREAD) tablet 300 mg  Status:  Discontinued     10/21/17 0638   10/14/17 0930  vancomycin (VANCOCIN) 2,500 mg in sodium chloride 0.9 % 500 mL IVPB     10/14/17 1159   10/14/17 0900  piperacillin-tazobactam (ZOSYN) IVPB 3.375 g  Status:  Discontinued     10/14/17 0905   10/12/17 0900  emtricitabine (EMTRIVA) capsule 200 mg  Status:  Discontinued     10/12/17 1036   10/11/17 0800  darunavir (PREZISTA) tablet 800 mg  Status:  Discontinued     10/10/17 1046   10/11/17 0800  ritonavir (NORVIR) tablet 100 mg  Status:  Discontinued     10/10/17 1046   10/07/17 1900  anidulafungin (ERAXIS) 100 mg in sodium chloride 0.9 % 100 mL IVPB     10/13/17 1954   10/07/17 1045  azithromycin (ZITHROMAX) 500 mg in sodium chloride 0.9 % 250 mL IVPB  Status:  Discontinued     10/09/17 0916   10/05/17 1000  oseltamivir (TAMIFLU) 6 MG/ML suspension 75 mg     10/06/17 1120   10/03/17 2200  oseltamivir (TAMIFLU) 6 MG/ML suspension 30 mg  Status:  Discontinued     10/05/17 0745   10/03/17 0400  fluconazole  (DIFLUCAN) IVPB 100 mg  Status:  Discontinued     10/07/17 1758   10/02/17 1200  dolutegravir (TIVICAY) tablet 50 mg         10/02/17 1200  emtricitabine-tenofovir AF (DESCOVY) 200-25 MG per tablet 1 tablet  Status:  Discontinued     10/10/17 1046   10/02/17 1000  oseltamivir (TAMIFLU) 6 MG/ML suspension 75 mg  Status:  Discontinued     10/03/17 1550   10/02/17 0900  vancomycin (VANCOCIN) IVPB 1000 mg/200 mL premix  Status:  Discontinued     10/02/17 1053   10/02/17 0800  vancomycin (VANCOCIN) IVPB 1000 mg/200 mL premix  Status:  Discontinued     10/01/17 2009   10/02/17 0600  ceFEPIme (MAXIPIME) 2 g in sodium chloride 0.9 % 100 mL IVPB  Status:  Discontinued     10/08/17 0839   10/02/17 0400  fluconazole (DIFLUCAN) IVPB 200 mg     10/02/17 0603   10/01/17  2200  ceFEPIme (MAXIPIME) 1 g in sodium chloride 0.9 % 100 mL IVPB  Status:  Discontinued     10/01/17 1841   10/01/17 2030  vancomycin (VANCOCIN) 2,500 mg in sodium chloride 0.9 % 500 mL IVPB     10/01/17 2355   10/01/17 2015  ceFEPIme (MAXIPIME) 2 g in sodium chloride 0.9 % 100 mL IVPB  Status:  Discontinued     10/01/17 2006   10/01/17 2015  vancomycin (VANCOCIN) IVPB 1000 mg/200 mL premix  Status:  Discontinued     10/01/17 2006   10/01/17 2000  ceFEPIme (MAXIPIME) 1 g in sodium chloride 0.9 % 100 mL IVPB  Status:  Discontinued     10/01/17 2010   10/01/17 1900  vancomycin (VANCOCIN) 2,500 mg in sodium chloride 0.9 % 500 mL IVPB  Status:  Discontinued     10/01/17 2009   10/01/17 1830  oseltamivir (TAMIFLU) capsule 75 mg  Status:  Discontinued     10/02/17 0738   10/01/17 1815  bictegravir-emtricitabine-tenofovir AF (BIKTARVY) 50-200-25 MG per tablet 1 tablet  Status:  Discontinued     10/02/17 1059   10/01/17 1730  levofloxacin (LEVAQUIN) tablet 500 mg  Status:  Discontinued     10/01/17 1809       Devices    LINES / TUBES:      Continuous Infusions: . sodium chloride Stopped (10/24/17 0508)  . dextrose 25 mL/hr  (10/27/17 0249)  . heparin 2,400 Units/hr (10/27/17 0246)     Objective: Vitals:   10/27/17 0418 10/27/17 0500 10/27/17 0600 10/27/17 0700  BP:  139/70 (!) 153/74 (!) 155/70  Pulse:  71 83 77  Resp:  17 (!) 21 (!) 21  Temp:      TempSrc:      SpO2:  93% 93% 92%  Weight: 247 lb 12.8 oz (112.4 kg)     Height:        Intake/Output Summary (Last 24 hours) at 10/27/2017 0757 Last data filed at 10/27/2017 0700 Gross per 24 hour  Intake 2969 ml  Output 450 ml  Net 2519 ml   Filed Weights   10/25/17 0500 10/26/17 0500 10/27/17 0418  Weight: 248 lb 10.9 oz (112.8 kg) 260 lb 2.3 oz (118 kg) 247 lb 12.8 oz (112.4 kg)    Physical Exam:  General: alert, follows commands, positive acute respiratory distress Neck:  Negative scars, masses, torticollis, lymphadenopathy, JVD,#6 cuffed trach in place, covered and clean negative sign of infection Lungs: Clear to auscultation bilaterally without wheezes or crackles, significantly less secretions, clear Cardiovascular: Regular rate and rhythm without murmur gallop or rub normal S1 and S2 Abdomen: negative abdominal pain, nondistended, positive soft, bowel sounds, no rebound, no ascites, no appreciable mass Extremities: No significant cyanosis, clubbing, or edema bilateral lower extremities Skin: Negative rashes, lesions, ulcers Psychiatric:  Negative depression, negative anxiety, negative fatigue, negative mania  Central nervous system:  Cranial nerves II through XII intact, tongue/uvula midline, bilateral upper extremity strength 4/5, bilateral lower extreme strength 2/5, sensation intact throughout,negative receptive aphasia.    .     Data Reviewed: Care during the described time interval was provided by me .  I have reviewed this patient's available data, including medical history, events of note, physical examination, and all test results as part of my evaluation.   CBC: Recent Labs  Lab 10/22/17 0524 10/23/17 0402 10/24/17 0509  10/25/17 0338 10/27/17 0411  WBC 11.6* 11.8* 11.5* 11.4* 9.1  HGB 7.7* 7.3* 8.4* 8.1* 7.8*  HCT 25.5* 23.6* 26.8* 26.1* 24.2*  MCV 99.2 96.7 96.4 93.9 92.4  PLT 213 259 325 321 163   Basic Metabolic Panel: Recent Labs  Lab 10/22/17 0524 10/23/17 0402 10/24/17 0509 10/25/17 0338 10/27/17 0411  NA 132* 131* 131* 131* 125*  K 4.5 5.5* 4.2 4.5 6.8*  CL 95* 94* 94* 94* 84*  CO2 '25 23 25 24 ' 21*  GLUCOSE 129* 107* 60* 123* 201*  BUN 79* 119* 58* 81* 131*  CREATININE 3.54* 5.27* 4.04* 5.60* 7.64*  CALCIUM 8.5* 8.5* 8.6* 8.7* 8.9  MG  --   --   --   --  3.2*  PHOS 7.3* 10.7* 6.7* 9.1* 10.0*   GFR: Estimated Creatinine Clearance: 14.1 mL/min (A) (by C-G formula based on SCr of 7.64 mg/dL (H)). Liver Function Tests: Recent Labs  Lab 10/22/17 0524 10/23/17 0402 10/24/17 0509 10/25/17 0338 10/27/17 0411  ALBUMIN 2.0* 2.1* 2.1* 2.1* 2.3*   No results for input(s): LIPASE, AMYLASE in the last 168 hours. No results for input(s): AMMONIA in the last 168 hours. Coagulation Profile: Recent Labs  Lab 10/23/17 0402 10/24/17 0509 10/25/17 0338 10/26/17 0849 10/27/17 0411  INR 1.48 1.29 1.35 1.40 1.59   Cardiac Enzymes: No results for input(s): CKTOTAL, CKMB, CKMBINDEX, TROPONINI in the last 168 hours. BNP (last 3 results) No results for input(s): PROBNP in the last 8760 hours. HbA1C: No results for input(s): HGBA1C in the last 72 hours. CBG: Recent Labs  Lab 10/26/17 1520 10/26/17 1633 10/26/17 1955 10/26/17 2329 10/27/17 0402  GLUCAP 121* 113* 123* 153* 203*   Lipid Profile: No results for input(s): CHOL, HDL, LDLCALC, TRIG, CHOLHDL, LDLDIRECT in the last 72 hours. Thyroid Function Tests: No results for input(s): TSH, T4TOTAL, FREET4, T3FREE, THYROIDAB in the last 72 hours. Anemia Panel: No results for input(s): VITAMINB12, FOLATE, FERRITIN, TIBC, IRON, RETICCTPCT in the last 72 hours. Urine analysis:    Component Value Date/Time   COLORURINE AMBER (A)  10/14/2017 0844   APPEARANCEUR CLOUDY (A) 10/14/2017 0844   LABSPEC 1.015 10/14/2017 0844   PHURINE 5.0 10/14/2017 0844   GLUCOSEU 50 (A) 10/14/2017 0844   HGBUR LARGE (A) 10/14/2017 0844   BILIRUBINUR NEGATIVE 10/14/2017 0844   BILIRUBINUR neg 11/12/2016 0946   KETONESUR NEGATIVE 10/14/2017 0844   PROTEINUR 100 (A) 10/14/2017 0844   UROBILINOGEN 1.0 11/12/2016 0946   UROBILINOGEN 1 09/21/2014 1205   NITRITE NEGATIVE 10/14/2017 0844   LEUKOCYTESUR NEGATIVE 10/14/2017 0844   Sepsis Labs: '@LABRCNTIP' (procalcitonin:4,lacticidven:4)  )No results found for this or any previous visit (from the past 240 hour(s)).       Radiology Studies: No results found.      Scheduled Meds: . amiodarone  200 mg Per Tube Daily  . chlorhexidine gluconate (MEDLINE KIT)  15 mL Mouth Rinse BID  . Chlorhexidine Gluconate Cloth  6 each Topical Daily  . clonazePAM  0.25 mg Per Tube Daily  . clonazePAM  0.5 mg Per Tube QHS  . diltiazem  60 mg Per Tube Q6H  . docusate  100 mg Per Tube BID  . dolutegravir  50 mg Oral Daily  . emtricitabine  200 mg Oral Once every 4 days  . famotidine  20 mg Per Tube QHS  . feeding supplement (VITAL HIGH PROTEIN)  1,000 mL Per Tube Q24H  . insulin aspart  0-20 Units Subcutaneous Q4H  . insulin glargine  20 Units Subcutaneous BID  . ipratropium-albuterol  3 mL Nebulization TID  . mouth rinse  15 mL Mouth  Rinse QID  . methylPREDNISolone (SOLU-MEDROL) injection  60 mg Intravenous Q24H  . polyethylene glycol  17 g Oral Daily  . scopolamine  1 patch Transdermal Q72H  . sennosides  5 mL Per Tube QHS  . sevelamer carbonate  2.4 g Per Tube TID WC  . sodium chloride flush  10-40 mL Intracatheter Q12H  . sodium chloride flush  3 mL Intravenous Q12H  . sodium polystyrene  30 g Oral Once  . tenofovir  300 mg Oral Q Wed  . Warfarin - Pharmacist Dosing Inpatient   Does not apply q1800   Continuous Infusions: . sodium chloride Stopped (10/24/17 0508)  . dextrose 25 mL/hr  (10/27/17 0249)  . heparin 2,400 Units/hr (10/27/17 0246)     LOS: 26 days    Time spent: 40 minutes    Ivianna Notch, Geraldo Docker, MD [facility] Pager [phone]   If 7PM-7AM, please contact night-coverage [email or web address] Password [facility] 10/27/2017, 7:57 AM

## 2017-10-27 NOTE — Progress Notes (Signed)
eLink Physician-Brief Progress Note Patient Name: Chris RiversJerome A Fair Jr. DOB: 12/12/1965 MRN: [id number]   Date of Service  10/27/2017  HPI/Events of Note  K+ = 6.8, PO4--- = 10 and Ca++ = 8.9.T waves on bedside monitor look prominent, Ca++xPO4--- double product = 89,  Therefore, reluctant to give more Ca++. Patient refused dialysis yesterday.   eICU Interventions  Will order: 1. D50 and Insulin 10 units IV. 2. Kayexalate 30 gm per tube now. 3. Bedside nurse to contact Nephrology so that they can arrange HD early in the day.      Intervention Category Major Interventions: Electrolyte abnormality - evaluation and management  Sommer,Steven Eugene 10/27/2017, 6:00 AM

## 2017-10-28 DIAGNOSIS — J439 Emphysema, unspecified: Secondary | ICD-10-CM

## 2017-10-28 DIAGNOSIS — Z008 Encounter for other general examination: Secondary | ICD-10-CM

## 2017-10-28 DIAGNOSIS — D638 Anemia in other chronic diseases classified elsewhere: Secondary | ICD-10-CM

## 2017-10-28 LAB — PHOSPHORUS: PHOSPHORUS: 10.9 mg/dL — AB (ref 2.5–4.6)

## 2017-10-28 LAB — GLUCOSE, CAPILLARY
GLUCOSE-CAPILLARY: 170 mg/dL — AB (ref 65–99)
GLUCOSE-CAPILLARY: 112 mg/dL — AB (ref 65–99)
Glucose-Capillary: 114 mg/dL — ABNORMAL HIGH (ref 65–99)
Glucose-Capillary: 138 mg/dL — ABNORMAL HIGH (ref 65–99)
Glucose-Capillary: 154 mg/dL — ABNORMAL HIGH (ref 65–99)
Glucose-Capillary: 159 mg/dL — ABNORMAL HIGH (ref 65–99)
Glucose-Capillary: 94 mg/dL (ref 65–99)

## 2017-10-28 LAB — RENAL FUNCTION PANEL
ANION GAP: 14 (ref 5–15)
Albumin: 2.4 g/dL — ABNORMAL LOW (ref 3.5–5.0)
BUN: 99 mg/dL — ABNORMAL HIGH (ref 6–20)
CALCIUM: 8.7 mg/dL — AB (ref 8.9–10.3)
CO2: 26 mmol/L (ref 22–32)
Chloride: 90 mmol/L — ABNORMAL LOW (ref 101–111)
Creatinine, Ser: 5.56 mg/dL — ABNORMAL HIGH (ref 0.61–1.24)
GFR calc Af Amer: 12 mL/min — ABNORMAL LOW (ref >60)
GFR calc non Af Amer: 11 mL/min — ABNORMAL LOW (ref >60)
GLUCOSE: 174 mg/dL — AB (ref 65–99)
PHOSPHORUS: 7.4 mg/dL — AB (ref 2.5–4.6)
POTASSIUM: 4.5 mmol/L (ref 3.5–5.1)
SODIUM: 130 mmol/L — AB (ref 135–145)

## 2017-10-28 LAB — BASIC METABOLIC PANEL
ANION GAP: 20 — AB (ref 5–15)
BUN: 143 mg/dL — ABNORMAL HIGH (ref 6–20)
CALCIUM: 8.8 mg/dL — AB (ref 8.9–10.3)
CO2: 21 mmol/L — AB (ref 22–32)
CREATININE: 8.14 mg/dL — AB (ref 0.61–1.24)
Chloride: 84 mmol/L — ABNORMAL LOW (ref 101–111)
GFR, EST AFRICAN AMERICAN: 8 mL/min — AB (ref >60)
GFR, EST NON AFRICAN AMERICAN: 7 mL/min — AB (ref >60)
Glucose, Bld: 154 mg/dL — ABNORMAL HIGH (ref 65–99)
Potassium: 6.5 mmol/L (ref 3.5–5.1)
SODIUM: 125 mmol/L — AB (ref 135–145)

## 2017-10-28 LAB — CBC
HCT: 23.7 % — ABNORMAL LOW (ref 39.0–52.0)
HEMATOCRIT: 25 % — AB (ref 39.0–52.0)
HEMOGLOBIN: 7.9 g/dL — AB (ref 13.0–17.0)
Hemoglobin: 7.7 g/dL — ABNORMAL LOW (ref 13.0–17.0)
MCH: 30.2 pg (ref 26.0–34.0)
MCH: 29.4 pg (ref 26.0–34.0)
MCHC: 32.5 g/dL (ref 30.0–36.0)
MCHC: 31.6 g/dL (ref 30.0–36.0)
MCV: 92.9 fL (ref 78.0–100.0)
MCV: 92.9 fL (ref 78.0–100.0)
PLATELETS: 327 10*3/uL (ref 150–400)
Platelets: 344 10*3/uL (ref 150–400)
RBC: 2.55 MIL/uL — AB (ref 4.22–5.81)
RBC: 2.69 MIL/uL — ABNORMAL LOW (ref 4.22–5.81)
RDW: 16.4 % — ABNORMAL HIGH (ref 11.5–15.5)
RDW: 16.2 % — ABNORMAL HIGH (ref 11.5–15.5)
WBC: 7.8 10*3/uL (ref 4.0–10.5)
WBC: 8.3 10*3/uL (ref 4.0–10.5)

## 2017-10-28 LAB — PROTIME-INR
INR: 2.24
PROTHROMBIN TIME: 24.6 s — AB (ref 11.4–15.2)

## 2017-10-28 LAB — MAGNESIUM: MAGNESIUM: 3.2 mg/dL — AB (ref 1.7–2.4)

## 2017-10-28 LAB — PREPARE RBC (CROSSMATCH)

## 2017-10-28 LAB — HEPARIN LEVEL (UNFRACTIONATED): HEPARIN UNFRACTIONATED: 0.25 [IU]/mL — AB (ref 0.30–0.70)

## 2017-10-28 MED ORDER — ALTEPLASE 2 MG IJ SOLR
2.0000 mg | Freq: Once | INTRAMUSCULAR | Status: DC | PRN
  Filled 2017-10-28: qty 2

## 2017-10-28 MED ORDER — WARFARIN SODIUM 7.5 MG PO TABS
7.5000 mg | ORAL_TABLET | Freq: Once | ORAL | Status: AC
  Administered 2017-10-28: 7.5 mg via ORAL
  Filled 2017-10-28: qty 1

## 2017-10-28 MED ORDER — LIDOCAINE-PRILOCAINE 2.5-2.5 % EX CREA
1.0000 "application " | TOPICAL_CREAM | CUTANEOUS | Status: DC | PRN
  Filled 2017-10-28: qty 5

## 2017-10-28 MED ORDER — PENTAFLUOROPROP-TETRAFLUOROETH EX AERO: 1.0000 "application " | INHALATION_SPRAY | CUTANEOUS | Status: DC | PRN

## 2017-10-28 MED ORDER — LIDOCAINE HCL (PF) 1 % IJ SOLN: 5.0000 mL | INTRAMUSCULAR | Status: DC | PRN

## 2017-10-28 MED ORDER — HEPARIN SODIUM (PORCINE) 1000 UNIT/ML DIALYSIS: 1000.0000 [IU] | INTRAMUSCULAR | Status: DC | PRN

## 2017-10-28 MED ORDER — WARFARIN SODIUM 10 MG PO TABS: 10.0000 mg | ORAL_TABLET | Freq: Once | ORAL | Status: DC

## 2017-10-28 MED ORDER — PAROXETINE HCL 20 MG PO TABS
20.0000 mg | ORAL_TABLET | Freq: Every day | ORAL | Status: DC
  Administered 2017-10-28 – 2017-10-29 (×2): 20 mg via ORAL
  Filled 2017-10-28 (×3): qty 1

## 2017-10-28 MED ORDER — SODIUM CHLORIDE 0.9 % IV SOLN: 100.0000 mL | INTRAVENOUS | Status: DC | PRN

## 2017-10-28 MED ORDER — HEPARIN SODIUM (PORCINE) 1000 UNIT/ML DIALYSIS: 20.0000 [IU]/kg | INTRAMUSCULAR | Status: DC | PRN

## 2017-10-28 MED ORDER — SODIUM POLYSTYRENE SULFONATE 15 GM/60ML PO SUSP
60.0000 g | Freq: Once | ORAL | Status: AC
  Administered 2017-10-28: 60 g via ORAL
  Filled 2017-10-28: qty 240

## 2017-10-28 MED ORDER — SODIUM CHLORIDE 0.9 % IV SOLN
Freq: Once | INTRAVENOUS | Status: AC
  Administered 2017-10-28: 200 mL via INTRAVENOUS

## 2017-10-28 NOTE — Progress Notes (Signed)
SLP Cancellation Note  Patient Details Name: Ruel FavorsJerome A Kelemen [name] MRN: [id number] DOB: 12/13/1965   Cancelled treatment:       Reason Eval/Treat Not Completed: Medical issues which prohibited therapy. Per RN, not appropriate today after HD this am. Will f/u 3/21.   Ferdinand LangoLeah Aletha Allebach MA, CCC-SLP [phone]    Braelon Sprung Meryl 10/28/2017, 3:09 PM

## 2017-10-28 NOTE — Care Management Note (Signed)
Case Management Note  Patient Details  Name: Chris RiversJerome A Mcneece Jr. MRN: [id number] Date of Birth: 05/26/1966  Subjective/Objective:  Pt admitted with SOB secondary to flu                Action/Plan:  PTA independent from home.     Expected Discharge Date:                  Expected Discharge Plan:  Skilled Nursing Facility  In-House Referral:  Clinical Social Work  Discharge planning Services  CM Consult  Post Acute Care Choice:    Choice offered to:     DME Arranged:    DME Agency:     HH Arranged:    HH Agency:     Status of Service:     If discussed at [employer] Meetings, dates discussed:    Additional Comments: 10/28/2017  [facility] has approval however no available beds.  Pt remains on vent and still requiring HD  10/22/17 LTACH referral given during LOS 10/22/17, attending in agreement with referral at this time.  Both facilities given referral, [facility] declined to offer bed due to lack of HD bed space, [facility] offered bed pending insurance approval.  CM discussed LTACH discharge with both pt and wife, CM also informed both that bed has been offered by [facility].  Pt/wife in agreement to allow [facility] to initiate insurance auth post discussion with [facility] liaison.  CSW also following as back up plan.    10/16/17 Pt remains on ventilator via trach and CRRT.  Palliative Care consulted.  CM will continue to follow for discharge needs  10/09/17 Pt was unable to wean off ventilator - pt received trach 10/09/17.  CSW informed of pending SNF placement - [facility]. Cherylann ParrClaxton, Demarco Bacci S, RN 10/28/2017, 3:51 PM

## 2017-10-28 NOTE — Progress Notes (Signed)
PULMONARY / CRITICAL CARE MEDICINE   Name: Chris RiversJerome A Bessey Jr. MRN: [id number] DOB: 11/30/1965    ADMISSION DATE:  10/01/2017 CONSULTATION DATE:  2/21  REFERRING MD:  Juleen ChinaKohut  CHIEF COMPLAINT:  SOB  HISTORY OF PRESENT ILLNESS:   52 yo male presented with dyspnea, cough, fever, hypoxia.  Found to have Influenza PNA causing ARDS.  Course complicated by hypervolemia and renal failure.  PMHx of HIV, A fib, COPD, DM.   SUBJECTIVE:  On dialysis this morning VITAL SIGNS: BP (!) 140/91 (BP Location: Left Arm)   Pulse 87   Temp 98.7 F (37.1 C) (Oral)   Resp 13   Ht 5\' 9"  (1.753 m)   Wt 216 lb 14.9 oz (98.4 kg)   SpO2 94%   BMI 32.04 kg/m   HEMODYNAMICS:    VENTILATOR SETTINGS: Vent Mode: PCV FiO2 (%):  [40 %] 40 % Set Rate:  [16 bmp] 16 bmp PEEP:  [5 cmH20] 5 cmH20 Pressure Support:  [5 cmH20] 5 cmH20 Plateau Pressure:  [18 cmH20-20 cmH20] 20 cmH20  INTAKE / OUTPUT: I/O last 3 completed shifts: In: 3697.2 [I.V.:1017.2; NG/GT:2680] Out: 1200 [Urine:1200]  PHYSICAL EXAMINATION: General: 52 year old male patient currently resting comfortably on dialysis more interactive and appropriate today HEENT: Normocephalic atraumatic.  Tracheostomy unremarkable Pulmonary: Scattered rhonchi equal chest rise no accessory use Cardiac: Regular rate and rhythm Abdomen: Soft nontender Extremities: Diffuse anasarca however warm, brisk cap refill. Neuro/psych: Awake oriented interactive LABS:  BMET Recent Labs  Lab 10/27/17 0411 10/28/17 0156 10/28/17 0838  NA 125* 125* 130*  K 6.8* 6.5* 4.5  CL 84* 84* 90*  CO2 21* 21* 26  BUN 131* 143* 99*  CREATININE 7.64* 8.14* 5.56*  GLUCOSE 201* 154* 174*   Electrolytes Recent Labs  Lab 10/27/17 0411 10/28/17 0156 10/28/17 0838  CALCIUM 8.9 8.8* 8.7*  MG 3.2* 3.2*  --   PHOS 10.0* 10.9* 7.4*   CBC Recent Labs  Lab 10/27/17 0411 10/28/17 0156 10/28/17 0838  WBC 9.1 7.8 8.3  HGB 7.8* 7.7* 7.9*  HCT 24.2* 23.7* 25.0*  PLT 328  327 344   Coag's Recent Labs  Lab 10/26/17 0849 10/27/17 0411 10/28/17 0156  INR 1.40 1.59 2.24    Sepsis Markers No results for input(s): LATICACIDVEN, PROCALCITON, O2SATVEN in the last 168 hours.  ABG No results for input(s): PHART, PCO2ART, PO2ART in the last 168 hours.  Liver Enzymes Recent Labs  Lab 10/25/17 0338 10/27/17 0411 10/28/17 0838  ALBUMIN 2.1* 2.3* 2.4*    Cardiac Enzymes No results for input(s): TROPONINI, PROBNP in the last 168 hours.  Glucose Recent Labs  Lab 10/27/17 1526 10/27/17 2001 10/28/17 0004 10/28/17 0432 10/28/17 0748 10/28/17 1117  GLUCAP 87 92 114* 159* 170* 154*    Imaging Dg Abd Portable 1v  Result Date: 10/27/2017 CLINICAL DATA:  Check feeding catheter placement EXAM: PORTABLE ABDOMEN - 1 VIEW COMPARISON:  None. FINDINGS: Nasogastric catheter is noted extending into the stomach. The proximal side port is noted at the level of the GE junction. This should be advanced several cm. IMPRESSION: Nasogastric catheter as described. This should be advanced several cm. Electronically Signed   By: Alcide CleverMark  Lukens M.D.   On: 10/27/2017 12:18    STUDIES:  Echo 2/25 >> EF 60 to 65%, mild MR Renal u/s 2/28 >> slight fullness of renal collecting system  CULTURES: Respiratory viral panel 2/21 >> Influenza A H3 Blood 2/21 >> negative Sputum 2/22 >> oral flora Urine 2/26 >> negative Sputum 2/26 >>  oral flora Blood 2/27 >> negative Sputum 3/5>>> oral flora Blood 3/6>>> negative  ANTIBIOTICS: Vancomycin 2/21 >>2/22 Cefepime 2/21 >> 2/28 Tamiflu 2/21 >> 2/26 Fluconazole 2/21 >> 2/27 Anidulafungin 2/27 >>3/6 Zithromax 2/27 >> 3/1  SIGNIFICANT EVENTS: 2/21 Admit 2/22 VDRF 2/27 ID consulted for persistent fever 2/28 Renal consult for AKI, Cardiology consult for A fib with RVR 3/01 Trach 3/03 Attempted HD 3/04 Changed to CRRT 3/05 Worsening hypoxia >> vent settings adjusted 3/12 CRRT stopped  3/13 first iHD; PSV x 8  hrs  LINES/TUBES: ETT 2/22 >> 3/1 Left IJ 2/27 >>> Trach 3/01 >>> R IJ HD cath 3/03 >>>    ASSESSMENT / PLAN:  Acute hypoxic/hypercapnic respiratory failure from Influenza PNA. Hx of COPD with emphysema. Purulent tracheobronchitis. A fib with RVR. Acute on chronic diastolic CHF with hx of HTN. Hypotension  Acute renal failure 2nd to ATN- remains anuric Hyperkalemia. Anemia of critical illness and chronic disease Esophageal candidiasis. Hx of HIV. DM type II. Acute metabolic encephalopathy >> improved 3/09. Hyperkalemia  Depression   DISCUSSION:  52 year old male patient with a history of HIV, atrial fibrillation, chronic obstructive pulmonary disease admitted initially for acute hypoxic respiratory failure in the setting of influenza and resultant ARDS.  Ileostomy was placed on 3/1, major barriers have been acute on chronic renal failure, persistent encephalopathy, and deconditioning following prolonged critical illness.  CRRT was stopped on 312 and he is now on intermittent dialysis Interval from last critical care visit, 3/12 to  3/18: -Completed prednisone taper on 3/12, 7-day course of Zosyn ended as well. -3/13: Pressure support ventilation Tolerated for almost 8 hours -Vas-Cath placed 3/16 -Last note from nephrology still hopeful for renal recovery but not making much urine -desaturated last night 3/17 ? Volume overload ? ? Anxiety related.  -3/18 refusing dialysis and again on 3/19 -3/20 getting HD   Plan/rec Resume PSV as indicated Volume removal w/ HD Cont BDs PRN CXR PT/OT Hope we can get him off vent.    Simonne Martinet ACNP-BC [facility] Pulmonary/Critical Care Pager # [phone] OR # [phone] if no answer      I

## 2017-10-28 NOTE — Progress Notes (Signed)
SLP Cancellation Note  Patient Details Name: Chris FavorsJerome A Grewell [name] MRN: [id number] DOB: 08/08/1966   Cancelled treatment:       Reason Eval/Treat Not Completed: Patient at procedure or test/unavailable  Ferdinand LangoLeah Yazmine Sorey MA, CCC-SLP [phone]  Chris [name] Meryl 10/28/2017, 10:58 AM

## 2017-10-28 NOTE — Progress Notes (Signed)
ANTICOAGULATION CONSULT NOTE  Pharmacy Consult:  Heparin Indication: pulmonary embolus/Afib  Allergies  Allergen Reactions  . Bactrim [Sulfamethoxazole-Trimethoprim] Hives    Patient Measurements: Height: 5\' 9"  (175.3 cm) Weight: 247 lb 12.8 oz (112.4 kg) IBW/kg (Calculated) : 70.7 Heparin dosing weight: 97 kg  Vital Signs: Temp: 98.6 F (37 C) (03/20 0007) Temp Source: Oral (03/20 0007) BP: 143/77 (03/20 0200) Pulse Rate: 83 (03/20 0200)  Labs: Recent Labs    10/25/17 0338 10/26/17 0849 10/27/17 0411 10/28/17 0156  HGB 8.1*  --  7.8* 7.7*  HCT 26.1*  --  24.2* 23.7*  PLT 321  --  328 327  LABPROT 16.6* 17.0* 18.8* 24.6*  INR 1.35 1.40 1.59 2.24  HEPARINUNFRC 0.44 0.48 0.55 0.25*  CREATININE 5.60*  --  7.64* 8.14*    Estimated Creatinine Clearance: 13.3 mL/min (A) (by C-G formula based on SCr of 8.14 mg/dL (H)).  Goal of Therapy:  INR 2-3  Heparin level 0.3-0.5 units/mL Monitor platelets by anticoagulation protocol: Yes  Plan: D/C heparin as INR > 2  Geannie RisenGreg Raquel Racey, PharmD, BCPS  10/28/2017 2:58 AM

## 2017-10-28 NOTE — Progress Notes (Addendum)
ANTICOAGULATION CONSULT NOTE - Follow Up Consult  Pharmacy Consult:  Heparin/warfarin Indication: pulmonary embolus/Afib  Allergies  Allergen Reactions  . Bactrim [Sulfamethoxazole-Trimethoprim] Hives    Patient Measurements: Height: 5\' 9"  (175.3 cm) Weight: 216 lb 14.9 oz (98.4 kg) IBW/kg (Calculated) : 70.7 Heparin dosing weight: 97 kg  Vital Signs: Temp: 98.2 F (36.8 C) (03/20 0722) Temp Source: Axillary (03/20 0722) BP: 142/77 (03/20 0930) Pulse Rate: 89 (03/20 0915)  Labs: Recent Labs    10/26/17 0849 10/27/17 0411 10/28/17 0156 10/28/17 0838  HGB  --  7.8* 7.7*  --   HCT  --  24.2* 23.7*  --   PLT  --  328 327  --   LABPROT 17.0* 18.8* 24.6*  --   INR 1.40 1.59 2.24  --   HEPARINUNFRC 0.48 0.55 0.25*  --   CREATININE  --  7.64* 8.14* 5.56*    Estimated Creatinine Clearance: 18.2 mL/min (A) (by C-G formula based on SCr of 5.56 mg/dL (H)).  Assessment: [age] YOM with Afib on Eliquis PTA, transitioned to warfarin given possible PE on Eliquis. INR overnight was therapeutic at 2.24, heparin bridge discontinued.  Patient did have bleeding episode 3/19 that prompted holding heparin for several hours, but warfarin was still given per discussion with RN and attending physician. Bleeding resolved overnight, no further issues. HGB still trending down to 7.7, PLT wnl.   Goal of Therapy:  INR 2-3  Heparin level 0.3-0.5 units/mL Monitor platelets by anticoagulation protocol: Yes   Plan: Warfarin 7.5 mg PO x 1 tonight Daily INR Monitor CBC, s/sx of bleeding   Al CorpusLindsey Eusebio Blazejewski, PharmD PGY1 Pharmacy Resident CCM pharmacy phone: [phone] Pager: [phone] 10/28/2017 10:31 AM

## 2017-10-28 NOTE — Progress Notes (Signed)
OT Cancellation Note  Patient Details Name: Chris RiversJerome A Fangman Jr. MRN: [id number] DOB: 12/11/1965   Cancelled Treatment:    Reason Eval/Treat Not Completed: Fatigue/lethargy limiting ability to participate(s/p HD) Will continue to follow.  Evern BioMayberry, Chaunte Hornbeck Lynn 10/28/2017, 1:26 PM

## 2017-10-28 NOTE — Progress Notes (Addendum)
Cortrak Tube Team Note:  Consult received to place a Cortrak feeding tube.   A 10 F Cortrak tube was placed in the right nare and secured with a nasal bridle at 80 cm. Per the Cortrak monitor reading the tube tip is at the pylorus. If further advancement of tube desired, please contact radiology.   No x-ray is required. RN may begin using tube.   If the tube becomes dislodged please keep the tube and contact the Cortrak team at [email or web address] (password [email or web address]) for replacement.  If after hours and replacement cannot be delayed, place a NG tube and confirm placement with an abdominal x-ray.    Vanessa Kickarly Giovanie Lefebre RD, LDN Clinical Nutrition Pager # [phone]

## 2017-10-28 NOTE — Plan of Care (Signed)
  Clinical Measurements: Diagnostic test results will improve 10/28/2017 [id number] - Not Progressing by Noe GensViverito, Caydn Justen A, RN Note Patient has been refusing HD treatments since Friday. Scheduled for HD today, 3/20. Wife encouraging patient to allow treatment.    Elimination: Will not experience complications related to urinary retention 10/28/2017 0552 - Progressing by Neoma Uhrich A, RN Note Patient producing urine now that he has not been allowing HD treatment.

## 2017-10-28 NOTE — Progress Notes (Signed)
PT Cancellation Note  Patient Details Name: Chris RiversJerome A Eastburn Jr. MRN: [id number] DOB: 05/12/1966   Cancelled Treatment:    Reason Eval/Treat Not Completed: Medical issues which prohibited therapy;Patient at procedure or test/unavailable(Had HD and too fatigued.  Will see inam.  )   [name] 10/28/2017, 1:26 PM Larin Depaoli,PT Acute Rehabilitation [phone])

## 2017-10-28 NOTE — Progress Notes (Addendum)
PROGRESS NOTE    Chris [name].  GQQ:[id number] DOB: 06-23-1966 DOA: 10/01/2017 PCP: System, Pcp Not In   Brief Narrative:  52 yo BM PMHx Depression HIV, A fib, COPD, and DM Type 2 without complication  Presented with dyspnea, cough, fever, and hypoxia and was found to have Influenza PNA causing ARDS. His hospital course has been complicated by hypervolemia and renal failure.     Subjective: 3/20 alert, nods yes and no to questions.  Currently on HD.  Seems less depressed today.    Assessment & Plan:   Principal Problem:   Evaluation by psychiatric service required Active Problems:   COPD exacerbation ([place])   Acute on chronic respiratory failure with hypoxia (HCC)   Diabetes mellitus type 2 in obese ([place])   COPD with exacerbation ([place])   Atrial fibrillation (HCC)   HIV (human immunodeficiency virus infection) ([place])   Tobacco abuse   Bronchitis   AKI (acute kidney injury) ([place])   Endotracheally intubated   Influenza, pneumonia   Atrial fibrillation with RVR (HCC)   Elevated troponin   Goals of care, counseling/discussion   Palliative care by specialist   Palliative care encounter  Acute respiratory failure with hypoxia and hypercapnia/ARDS secondary positive influenza Pneumonia -failed weaning trial. Now with trach -3/17 did not tolerate SBT today extremely weak. -SBT daily: Currently patient not participating.Appears depressed -scopolamine patch has reduced secretions significantly.   COPD/Emphysema   -DuoNeb TID -solu-Medrol 60 mg daily  Purulent Tracheobronchitis -Completed course of antibiotics no ongoing evidence of infection  A. Fib with RVR -Currently in NSR -Amiodarone 200 mg daily -Cardizem 60 mg QID -Coumadin per pharmacy Recent Labs  Lab 10/22/17 0524 10/23/17 0402 10/24/17 0509 10/25/17 0338 10/26/17 0849 10/27/17 0411 10/28/17 0156  INR 1.94 1.48 1.29 1.35 1.40 1.59 2.24  -currently subtherapeutic. Continue heparin [place] until  therapeutic  Acute on Chronic diastolic CHF -Strict in and outince admission +7.8 L -Daily weight  Filed Weights   10/27/17 0418 10/28/17 0322 10/28/17 0722  Weight: 247 lb 12.8 oz (112.4 kg) 216 lb 14.9 oz (98.4 kg) 216 lb 14.9 oz (98.4 kg)  -volume management per HD -transfuse for hemoglobin< 8 -3/20 transfuse 1 unit PRBC   Hypotension  Resolved - BP stable    Acute renal failure (baseline Cr 0.79) -ATN   -intermittent  HD per Nephrology -patient now accepting HD  -consulted psychiatry does patient have capacity to make medical decisions?  Hyperkalemia -Managed by HD   Anemia of Critical illness/Chronic disease  Recent Labs  Lab 10/22/17 0524 10/23/17 0402 10/24/17 0509 10/25/17 0338 10/27/17 0411 10/28/17 0156  HGB 7.7* 7.3* 8.4* 8.1* 7.8* 7.7*  -stable   Esophageal Candidiasis -Complete course of treatment   HIV -considering patient treated for esophageal candidiasis, will obtain updated CD4 count good possibility< 200 -HIV RNA quant; <20 -continue Emtriva + Tivicay    DM type 2 uncontrolled with complication -6/71 Hemoglobin A1c=10.5 -Lantus 20 units BID -resistant SSI   Acute metabolic encephalopathy -appears improved but difficult to tell patient on trach. -uremia most likely contributing. Hopefully will imve with HD -Resolved  Dysphagia -requested Swallow study 3/18  Depression -3/20 Paxil 20 mg daily   DVT prophylaxis: heparin drip Code Status: Full Family Communication: None Disposition Plan: TBD   Consultants:  Nephrology Radiology    Procedures/Significant Events:  2/21 Admit 2/22 Intubated 2/27 ID consulted for persistent fever 2/28 Renal consult for AKI, Cardiology consult for A fib with RVR 3/01 Trach 3/03 R IJ  HD cath - attempted HD 3/04 Changed to CRRT 3/05 Worsening hypoxia >> vent settings adjusted 3/12 CRRT stopped  3/13 first iHD; PSV x 8 hrs    I have personally reviewed and interpreted all radiology studies  and my findings are as above.  VENTILATOR SETTINGS:    Cultures   Antimicrobials: Anti-infectives (From admission, onward)   Start     Stop   10/24/17 2000  emtricitabine (EMTRIVA) capsule 200 mg         10/24/17 1018  ceFAZolin (ANCEF) 2-4 GM/100ML-% IVPB    Comments:  Hilma Favors   : cabinet override   10/24/17 1129   10/21/17 2000  tenofovir (VIREAD) tablet 300 mg         10/16/17 1200  piperacillin-tazobactam (ZOSYN) IVPB 3.375 g     10/20/17 1751   10/16/17 1000  emtricitabine (EMTRIVA) capsule 200 mg  Status:  Discontinued     10/21/17 0638   10/15/17 0900  vancomycin (VANCOCIN) IVPB 1000 mg/200 mL premix  Status:  Discontinued     10/15/17 1015   10/14/17 1200  piperacillin-tazobactam (ZOSYN) IVPB 3.375 g  Status:  Discontinued     10/16/17 0801   10/14/17 1000  tenofovir (VIREAD) tablet 300 mg  Status:  Discontinued     10/21/17 0638   10/14/17 0930  vancomycin (VANCOCIN) 2,500 mg in sodium chloride 0.9 % 500 mL IVPB     10/14/17 1159   10/14/17 0900  piperacillin-tazobactam (ZOSYN) IVPB 3.375 g  Status:  Discontinued     10/14/17 0905   10/12/17 0900  emtricitabine (EMTRIVA) capsule 200 mg  Status:  Discontinued     10/12/17 1036   10/11/17 0800  darunavir (PREZISTA) tablet 800 mg  Status:  Discontinued     10/10/17 1046   10/11/17 0800  ritonavir (NORVIR) tablet 100 mg  Status:  Discontinued     10/10/17 1046   10/07/17 1900  anidulafungin (ERAXIS) 100 mg in sodium chloride 0.9 % 100 mL IVPB     10/13/17 1954   10/07/17 1045  azithromycin (ZITHROMAX) 500 mg in sodium chloride 0.9 % 250 mL IVPB  Status:  Discontinued     10/09/17 0916   10/05/17 1000  oseltamivir (TAMIFLU) 6 MG/ML suspension 75 mg     10/06/17 1120   10/03/17 2200  oseltamivir (TAMIFLU) 6 MG/ML suspension 30 mg  Status:  Discontinued     10/05/17 0745   10/03/17 0400  fluconazole (DIFLUCAN) IVPB 100 mg  Status:  Discontinued     10/07/17 1758   10/02/17 1200  dolutegravir (TIVICAY) tablet  50 mg         10/02/17 1200  emtricitabine-tenofovir AF (DESCOVY) 200-25 MG per tablet 1 tablet  Status:  Discontinued     10/10/17 1046   10/02/17 1000  oseltamivir (TAMIFLU) 6 MG/ML suspension 75 mg  Status:  Discontinued     10/03/17 1550   10/02/17 0900  vancomycin (VANCOCIN) IVPB 1000 mg/200 mL premix  Status:  Discontinued     10/02/17 1053   10/02/17 0800  vancomycin (VANCOCIN) IVPB 1000 mg/200 mL premix  Status:  Discontinued     10/01/17 2009   10/02/17 0600  ceFEPIme (MAXIPIME) 2 g in sodium chloride 0.9 % 100 mL IVPB  Status:  Discontinued     10/08/17 0839   10/02/17 0400  fluconazole (DIFLUCAN) IVPB 200 mg     10/02/17 0603   10/01/17 2200  ceFEPIme (MAXIPIME) 1 g in sodium chloride 0.9 %  100 mL IVPB  Status:  Discontinued     10/01/17 1841   10/01/17 2030  vancomycin (VANCOCIN) 2,500 mg in sodium chloride 0.9 % 500 mL IVPB     10/01/17 2355   10/01/17 2015  ceFEPIme (MAXIPIME) 2 g in sodium chloride 0.9 % 100 mL IVPB  Status:  Discontinued     10/01/17 2006   10/01/17 2015  vancomycin (VANCOCIN) IVPB 1000 mg/200 mL premix  Status:  Discontinued     10/01/17 2006   10/01/17 2000  ceFEPIme (MAXIPIME) 1 g in sodium chloride 0.9 % 100 mL IVPB  Status:  Discontinued     10/01/17 2010   10/01/17 1900  vancomycin (VANCOCIN) 2,500 mg in sodium chloride 0.9 % 500 mL IVPB  Status:  Discontinued     10/01/17 2009   10/01/17 1830  oseltamivir (TAMIFLU) capsule 75 mg  Status:  Discontinued     10/02/17 0738   10/01/17 1815  bictegravir-emtricitabine-tenofovir AF (BIKTARVY) 50-200-25 MG per tablet 1 tablet  Status:  Discontinued     10/02/17 1059   10/01/17 1730  levofloxacin (LEVAQUIN) tablet 500 mg  Status:  Discontinued     10/01/17 1809       Devices    LINES / TUBES:      Continuous Infusions: . sodium chloride Stopped (10/24/17 0508)  . sodium chloride    . sodium chloride    . dextrose Stopped (10/27/17 1227)     Objective: Vitals:   10/28/17 0500 10/28/17  0700 10/28/17 0722 10/28/17 0810  BP: (!) 166/77 (!) 170/81 (!) 167/80   Pulse: 90 80  83  Resp: 20 17  (!) 26  Temp:   98.2 F (36.8 C)   TempSrc:   Axillary   SpO2: 97% 94%  93%  Weight:   216 lb 14.9 oz (98.4 kg)   Height:        Intake/Output Summary (Last 24 hours) at 10/28/2017 0820 Last data filed at 10/28/2017 0700 Gross per 24 hour  Intake 2215.18 ml  Output 500 ml  Net 1715.18 ml   Filed Weights   10/27/17 0418 10/28/17 0322 10/28/17 0722  Weight: 247 lb 12.8 oz (112.4 kg) 216 lb 14.9 oz (98.4 kg) 216 lb 14.9 oz (98.4 kg)    Physical Exam:  General: alert, follows commands, positive acute respiratory distress, tolerating HD Neck:  Negative scars, masses, torticollis, lymphadenopathy, JVD,#6 cuffed trach in place, covered and clean negative sign of infection Lungs: Clear to auscultation bilaterally without wheezes or crackles, copious clear secretion suctioned from trachea. Cardiovascular: Regular rate and rhythm without murmur gallop or rub normal S1 and S2 Abdomen: negative abdominal pain, nondistended, positive soft, bowel sounds, no rebound, no ascites, no appreciable mass Extremities: No significant cyanosis, clubbing, or edema bilateral lower extremities Skin: Negative rashes, lesions, ulcers Psychiatric:   Positive depression, negative anxiety, negative fatigue, negative mania  Central nervous system:  Cranial nerves II through XII intact, tongue/uvula midline, bilateral upper extremity strength 4/5, bilateral lower extreme strength 2/5, sensation intact throughout,negative receptive aphasia.   .     Data Reviewed: Care during the described time interval was provided by me .  I have reviewed this patient's available data, including medical history, events of note, physical examination, and all test results as part of my evaluation.   CBC: Recent Labs  Lab 10/23/17 0402 10/24/17 0509 10/25/17 0338 10/27/17 0411 10/28/17 0156  WBC 11.8* 11.5* 11.4* 9.1  7.8  HGB 7.3* 8.4* 8.1* 7.8* 7.7*  HCT 23.6* 26.8* 26.1* 24.2* 23.7*  MCV 96.7 96.4 93.9 92.4 92.9  PLT 259 325 321 328 161   Basic Metabolic Panel: Recent Labs  Lab 10/23/17 0402 10/24/17 0509 10/25/17 0338 10/27/17 0411 10/28/17 0156  NA 131* 131* 131* 125* 125*  K 5.5* 4.2 4.5 6.8* 6.5*  CL 94* 94* 94* 84* 84*  CO2 _0 21* 21*  GLUCOSE 107* 60* 123* 201* 154*  BUN 119* 58* 81* 131* 143*  CREATININE 5.27* 4.04* 5.60* 7.64* 8.14*  CALCIUM 8.5* 8.6* 8.7* 8.9 8.8*  MG  --   --   --  3.2* 3.2*  PHOS 10.7* 6.7* 9.1* 10.0* 10.9*   GFR: Estimated Creatinine Clearance: 12.4 mL/min (A) (by C-G formula based on SCr of 8.14 mg/dL (H)). Liver Function Tests: Recent Labs  Lab 10/22/17 0524 10/23/17 0402 10/24/17 0509 10/25/17 0338 10/27/17 0411  ALBUMIN 2.0* 2.1* 2.1* 2.1* 2.3*   No results for input(s): LIPASE, AMYLASE in the last 168 hours. No results for input(s): AMMONIA in the last 168 hours. Coagulation Profile: Recent Labs  Lab 10/24/17 0509 10/25/17 0338 10/26/17 0849 10/27/17 0411 10/28/17 0156  INR 1.29 1.35 1.40 1.59 2.24   Cardiac Enzymes: No results for input(s): CKTOTAL, CKMB, CKMBINDEX, TROPONINI in the last 168 hours. BNP (last 3 results) No results for input(s): PROBNP in the last 8760 hours. HbA1C: No results for input(s): HGBA1C in the last 72 hours. CBG: Recent Labs  Lab 10/27/17 1526 10/27/17 2001 10/28/17 0004 10/28/17 0432 10/28/17 0748  GLUCAP 87 92 114* 159* 170*   Lipid Profile: No results for input(s): CHOL, HDL, LDLCALC, TRIG, CHOLHDL, LDLDIRECT in the last 72 hours. Thyroid Function Tests: No results for input(s): TSH, T4TOTAL, FREET4, T3FREE, THYROIDAB in the last 72 hours. Anemia Panel: No results for input(s): VITAMINB12, FOLATE, FERRITIN, TIBC, IRON, RETICCTPCT in the last 72 hours. Urine analysis:    Component Value Date/Time   COLORURINE AMBER (A) 10/14/2017 0844   APPEARANCEUR CLOUDY (A) 10/14/2017 0844    LABSPEC 1.015 10/14/2017 0844   PHURINE 5.0 10/14/2017 0844   GLUCOSEU 50 (A) 10/14/2017 0844   HGBUR LARGE (A) 10/14/2017 0844   BILIRUBINUR NEGATIVE 10/14/2017 0844   BILIRUBINUR neg 11/12/2016 0946   KETONESUR NEGATIVE 10/14/2017 0844   PROTEINUR 100 (A) 10/14/2017 0844   UROBILINOGEN 1.0 11/12/2016 0946   UROBILINOGEN 1 09/21/2014 1205   NITRITE NEGATIVE 10/14/2017 0844   LEUKOCYTESUR NEGATIVE 10/14/2017 0844   Sepsis Labs: _1 (procalcitonin:4,lacticidven:4)  )No results found for this or any previous visit (from the past 240 hour(s)).       Radiology Studies: Dg Abd Portable 1v  Result Date: 10/27/2017 CLINICAL DATA:  Check feeding catheter placement EXAM: PORTABLE ABDOMEN - 1 VIEW COMPARISON:  None. FINDINGS: Nasogastric catheter is noted extending into the stomach. The proximal side port is noted at the level of the GE junction. This should be advanced several cm. IMPRESSION: Nasogastric catheter as described. This should be advanced several cm. Electronically Signed   By: Inez Catalina M.D.   On: 10/27/2017 12:18        Scheduled Meds: . amiodarone  200 mg Per Tube Daily  . chlorhexidine gluconate (MEDLINE KIT)  15 mL Mouth Rinse BID  . Chlorhexidine Gluconate Cloth  6 each Topical Daily  . clonazePAM  0.25 mg Per Tube Daily  . clonazePAM  0.5 mg Per Tube QHS  . diltiazem  60 mg Per Tube Q6H  . docusate  100 mg Per Tube BID  .  dolutegravir  50 mg Oral Daily  . emtricitabine  200 mg Oral Once every 4 days  . famotidine  20 mg Per Tube QHS  . feeding supplement (VITAL HIGH PROTEIN)  1,000 mL Per Tube Q24H  . insulin aspart  0-20 Units Subcutaneous Q4H  . insulin glargine  20 Units Subcutaneous BID  . ipratropium-albuterol  3 mL Nebulization TID  . mouth rinse  15 mL Mouth Rinse QID  . methylPREDNISolone (SOLU-MEDROL) injection  60 mg Intravenous Q24H  . polyethylene glycol  17 g Oral Daily  . scopolamine  1 patch Transdermal Q72H  . sennosides  5 mL Per  Tube QHS  . sevelamer carbonate  2.4 g Per Tube TID WC  . sodium chloride flush  10-40 mL Intracatheter Q12H  . sodium chloride flush  3 mL Intravenous Q12H  . tenofovir  300 mg Oral Q Wed  . Warfarin - Pharmacist Dosing Inpatient   Does not apply q1800   Continuous Infusions: . sodium chloride Stopped (10/24/17 0508)  . sodium chloride    . sodium chloride    . dextrose Stopped (10/27/17 1227)     LOS: 27 days    Time spent: 40 minutes    Jshawn Hurta, Geraldo Docker, MD [facility] Pager [phone]   If 7PM-7AM, please contact night-coverage [email or web address] Password [email or web address] 10/28/2017, 8:20 AM

## 2017-10-28 NOTE — Procedures (Signed)
52 yo AAM HIV AF COPD MO Admitted w/resp failure, VDRF. + Flu A/PNA; possible PE; HIV,Baseline creatinine 0.79 10/01/17. AKI in setting of above issues. CRRT initiated 10/12/17 for massive anasarca /hyperkalemia. R IJ temp cath 3/3 by CCM.    He refused hemodialysis 2 days in a row, was given kayexalate for hyperkalemia.   On hemodialysis now.  On low K bath for K 6.5 today.  Attempting 4 liters to remove. Stable BP so far. Making urine .  Since making urine will watch with current HD cath for now.  Lauris PoagAlvin C Keosha Rossa, MD

## 2017-10-28 NOTE — Progress Notes (Signed)
PMT progress note  Patient is back from dialysis, has Cortrak feeding tube placed earlier this am, Chris [name] at bedside. Is awake alert, has trach, is able to mouth words appropriately, appears in good spirits.  Chris [name], colleague from [facility] is by the patient's bedside.   BP (!) 179/79 (BP Location: Left Arm)   Pulse 96   Temp 98.5 F (36.9 C) (Oral)   Resp 15   Ht 5\' 9"  (1.753 m)   Wt 94.4 kg (208 lb 1.8 oz)   SpO2 94%   BMI 30.73 kg/m  Labs and images noted Chart reviewed  Discussed with patient and Chris [name] also by the bedside.   Awake alert Has NGT, trach In no distress Regular S1 S2 Abdomen soft Has generalized edema  A/P: Continue current mode of care Patient being evaluated for [facility] facility Chris [name] thankful for the care and information she has received from the hospital, she does not have any questions about his care currently.  PMT will follow peripherally 25 minutes spent  Rosalin HawkingZeba Colbie Sliker MD [facility] team [phone]  [phone]

## 2017-10-29 DIAGNOSIS — N19 Unspecified kidney failure: Secondary | ICD-10-CM

## 2017-10-29 LAB — PROTIME-INR
INR: 2.95
Prothrombin Time: 30.5 seconds — ABNORMAL HIGH (ref 11.4–15.2)

## 2017-10-29 LAB — TYPE AND SCREEN
ABO/RH(D): A POS
Antibody Screen: NEGATIVE
Unit division: 0

## 2017-10-29 LAB — RENAL FUNCTION PANEL
ANION GAP: 15 (ref 5–15)
Albumin: 2.3 g/dL — ABNORMAL LOW (ref 3.5–5.0)
BUN: 85 mg/dL — AB (ref 6–20)
CALCIUM: 8.8 mg/dL — AB (ref 8.9–10.3)
CO2: 27 mmol/L (ref 22–32)
Chloride: 89 mmol/L — ABNORMAL LOW (ref 101–111)
Creatinine, Ser: 4.85 mg/dL — ABNORMAL HIGH (ref 0.61–1.24)
GFR calc Af Amer: 15 mL/min — ABNORMAL LOW (ref >60)
GFR calc non Af Amer: 13 mL/min — ABNORMAL LOW (ref >60)
GLUCOSE: 236 mg/dL — AB (ref 65–99)
Phosphorus: 7.3 mg/dL — ABNORMAL HIGH (ref 2.5–4.6)
Potassium: 4.3 mmol/L (ref 3.5–5.1)
Sodium: 131 mmol/L — ABNORMAL LOW (ref 135–145)

## 2017-10-29 LAB — BPAM RBC
BLOOD PRODUCT EXPIRATION DATE: 201903262359
ISSUE DATE / TIME: 201903201042
UNIT TYPE AND RH: 6200

## 2017-10-29 LAB — GLUCOSE, CAPILLARY
GLUCOSE-CAPILLARY: 128 mg/dL — AB (ref 65–99)
Glucose-Capillary: 103 mg/dL — ABNORMAL HIGH (ref 65–99)
Glucose-Capillary: 145 mg/dL — ABNORMAL HIGH (ref 65–99)
Glucose-Capillary: 177 mg/dL — ABNORMAL HIGH (ref 65–99)
Glucose-Capillary: 238 mg/dL — ABNORMAL HIGH (ref 65–99)

## 2017-10-29 LAB — CBC
HCT: 26.2 % — ABNORMAL LOW (ref 39.0–52.0)
HEMOGLOBIN: 8.3 g/dL — AB (ref 13.0–17.0)
MCH: 30.3 pg (ref 26.0–34.0)
MCHC: 31.7 g/dL (ref 30.0–36.0)
MCV: 95.6 fL (ref 78.0–100.0)
Platelets: 295 10*3/uL (ref 150–400)
RBC: 2.74 MIL/uL — ABNORMAL LOW (ref 4.22–5.81)
RDW: 16.8 % — AB (ref 11.5–15.5)
WBC: 6.3 10*3/uL (ref 4.0–10.5)

## 2017-10-29 LAB — MAGNESIUM: MAGNESIUM: 2.6 mg/dL — AB (ref 1.7–2.4)

## 2017-10-29 MED ORDER — METHYLPREDNISOLONE SODIUM SUCC 40 MG IJ SOLR
40.0000 mg | INTRAMUSCULAR | Status: DC
  Administered 2017-10-29: 40 mg via INTRAVENOUS
  Filled 2017-10-29 (×2): qty 1

## 2017-10-29 MED ORDER — WARFARIN SODIUM 7.5 MG PO TABS
7.5000 mg | ORAL_TABLET | Freq: Once | ORAL | Status: AC
Start: 2017-10-29 — End: 2017-10-29
  Administered 2017-10-29: 7.5 mg via ORAL
  Filled 2017-10-29: qty 1

## 2017-10-29 NOTE — Progress Notes (Signed)
Inpatient Rehabilitation  Per PT/OT request, patient was screened by Sean Malinowski for appropriateness for an Inpatient Acute Rehab consult.  At this time we are recommending an Inpatient Rehab consult.  Text paged MD to notify; please order if you are agreeable.   Leslye Puccini, M.A., CCC/SLP Admission Coordinator  [facility]  Cell [phone]   

## 2017-10-29 NOTE — Progress Notes (Signed)
Inpatient Rehabilitation  Per PT/OT request, patient was screened by Johnthomas Lader for appropriateness for an Inpatient Acute Rehab consult.  At this time we are recommending an Inpatient Rehab consult.  Text paged MD to notify; please order if you are agreeable.   Mckell Riecke, M.A., CCC/SLP Admission Coordinator  [facility]  Cell [phone]   

## 2017-10-29 NOTE — Progress Notes (Signed)
Pt placed back on vent wean 5/5 due to feeling tired on ATC.

## 2017-10-29 NOTE — Progress Notes (Signed)
Physical Therapy Treatment Patient Details Name: Chris RiversJerome A Kott Jr. MRN: [id number] DOB: 05/17/1966 Today's Date: 10/29/2017    History of Present Illness 52 yo AAM HIV AF COPD MO Admitted w/resp failure, VDRF. + Flu A/PNA; possible PE;  Baseline creatinine 0.79 10/01/17. AKI in setting of above issues. HDX1 3/4 for volume, markedly catabolic/recurrent [name]^K, massively overloaded. CRRT initiated 10/12/17 for massive anasarca /hyperkalemia. R IJ temp cath 3/3 by CCM.     PT Comments    Pt admitted with above diagnosis. Pt currently with functional limitations due to the deficits listed below (see PT Problem List). Pt was able to tolerate standing in the Vital Go tilt bed for 22 minutes today at full tilt at 84 degrees.  Progressing well.  Pt will benefit from skilled PT to increase their independence and safety with mobility to allow discharge to the venue listed below.     Follow Up Recommendations  CIR;Supervision/Assistance - 24 hour(if able to wean, if not LTACH)     Equipment Recommendations  Other (comment)(TBA)    Recommendations for Other Services Rehab consult     Precautions / Restrictions Precautions Precautions: Fall Precaution Comments: on vent with trach part of treatment and then trach collar Restrictions Weight Bearing Restrictions: No Other Position/Activity Restrictions: stood with Vital Go bed with gradual elevation 30-40-55 - 65- 82 degrees and vital signs monitored throughout, tolerated well    Mobility  Bed Mobility                  Transfers Overall transfer level: Needs assistance Equipment used: (Vital Go tilt bed) Transfers: Sit to/from Stand Sit to Stand: +2 physical assistance;Total assist         General transfer comment: Pt tilted in Vital go tilt bed to 84degrees (full tilt) for 22 minutes.  straps loosened and pt able to perform quad sets and buttock squeezes.  Attempts by pt to shift hips side to side unsuccessful for the most part. Pt also  stood holding onto RW while in tilt bed for UE support. Pt also demonstrates some trunk movement by holding head upright and moving uppper trunk while in the tilt bed.  Pt was initially on vent at 40% FiO2 and 5PEEP however RT came in and placed pt on 40% trach collar in the last 5 minutes of standing.  Pt sats hovering between 88-92% entire treatment and he did have incr secretions therefore after 22 minutes, tilted pt to 40 degrees and RT suctioned pt and then PT/OT positionined pt in close to 80 degree chair position in bed.  Pt propped with pillows as well.  Nurse Bonita QuinLinda present in room to observe pt in tilting regimen.   Ambulation/Gait             General Gait Details: unable   Stairs            Wheelchair Mobility    Modified Rankin (Stroke Patients Only)       Balance           Standing balance support: Bilateral upper extremity supported;During functional activity Standing balance-Leahy Scale: Zero Standing balance comment: flexes knees into straps on vital go bed; able to activate LE's pushing down to attempt straightening knees with cues for brief episodes                            Cognition Arousal/Alertness: Awake/alert Behavior During Therapy: Flat affect Overall Cognitive Status: Difficult to assess  General Comments: appears intact cognitively      Exercises Total Joint Exercises Ankle Circles/Pumps: AROM;10 reps;Both;Seated General Exercises - Upper Extremity Shoulder Flexion: AAROM;Both;5 reps;Seated Shoulder ABduction: AAROM;Both;5 reps;Supine General Exercises - Lower Extremity Ankle Circles/Pumps: AROM;10 reps;Both;Seated Quad Sets: AROM;Both;10 reps;Standing Gluteal Sets: AROM;Both;10 reps;Standing    General Comments        Pertinent Vitals/Pain Pain Assessment: No/denies pain    Home Living                      Prior Function            PT Goals (current  goals can now be found in the care plan section) Acute Rehab PT Goals Patient Stated Goal: to get better Progress towards PT goals: Progressing toward goals    Frequency    Min 3X/week      PT Plan Current plan remains appropriate    Co-evaluation PT/OT/SLP Co-Evaluation/Treatment: Yes Reason for Co-Treatment: Complexity of the patient's impairments (multi-system involvement) PT goals addressed during session: Mobility/safety with mobility        AM-PAC PT "6 Clicks" Daily Activity  Outcome Measure  Difficulty turning over in bed (including adjusting bedclothes, sheets and blankets)?: Unable Difficulty moving from lying on back to sitting on the side of the bed? : Unable Difficulty sitting down on and standing up from a chair with arms (e.g., wheelchair, bedside commode, etc,.)?: Unable Help needed moving to and from a bed to chair (including a wheelchair)?: Total Help needed walking in hospital room?: Total Help needed climbing 3-5 steps with a railing? : Total 6 Click Score: 6    End of Session Equipment Utilized During Treatment: Other (comment)(vent, trach collar, vital go tilt bed) Activity Tolerance: Patient tolerated treatment well Patient left: in bed;with call bell/phone within reach;with nursing/sitter in room Nurse Communication: Mobility status;Need for lift equipment PT Visit Diagnosis: Unsteadiness on feet (R26.81);Muscle weakness (generalized) (M62.81)     Time: 1041-1130 PT Time Calculation (min) (ACUTE ONLY): 49 min  Charges:  $Therapeutic Exercise: 8-22 mins $Therapeutic Activity: 8-22 mins                    G Codes:       Ayako Tapanes,PT Acute Rehabilitation [phone] (pager)    Berline Lopes 10/29/2017, 1:23 PM

## 2017-10-29 NOTE — Progress Notes (Signed)
Occupational Therapy Treatment Patient Details Name: Chris RiversJerome A Streight Jr. MRN: [id number] DOB: 12/07/1965 Today's Date: 10/29/2017    History of present illness 52 yo AAM HIV AF COPD MO Admitted w/resp failure, VDRF. + Flu A/PNA; possible PE;  Baseline creatinine 0.79 10/01/17. AKI in setting of above issues. HDX1 3/4 for volume, markedly catabolic/recurrent [name]^K, massively overloaded. CRRT initiated 10/12/17 for massive anasarca /hyperkalemia. R IJ temp cath 3/3 by CCM.    OT comments  Pt remains highly motivated. Progressing in strength and endurance with upright positioning in standing and chair position using Vital Go bed. Stood x 22 minutes at maximum incline. Performed UE exercise with assist. Pt tolerating trach collar at end of session with RN closely monitoring.  Follow Up Recommendations  CIR    Equipment Recommendations       Recommendations for Other Services      Precautions / Restrictions Precautions Precautions: Fall Precaution Comments: on vent with trach part of treatment and then trach collar Restrictions Weight Bearing Restrictions: No Other Position/Activity Restrictions: stood with Vital Go bed with gradual elevation 30-40-55 - 65- 82 degrees and vital signs monitored throughout, tolerated well       Mobility Bed Mobility                  Transfers Overall transfer level: Needs assistance Equipment used: (Vital Go tilt bed) Transfers: Sit to/from Stand Sit to Stand: +2 physical assistance;Total assist         General transfer comment: Pt tilted in Vital go tilt bed to 84degrees (full tilt) for 22 minutes.  straps loosened and pt able to perform quad sets and buttock squeezes.  Attempts by pt to shift hips side to side unsuccessful for the most part. Pt also stood holding onto RW while in tilt bed for UE support. Pt also demonstrates some trunk movement by holding head upright and moving uppper trunk while in the tilt bed.  Pt was initially on vent at 40%  FiO2 and 5PEEP however RT came in and placed pt on 40% trach collar in the last 5 minutes of standing.  Pt sats hovering between 88-92% entire treatment and he did have incr secretions therefore after 22 minutes, tilted pt to 40 degrees and RT suctioned pt and then PT/OT positionined pt in close to 80 degree chair position in bed.  Pt propped with pillows as well.  Nurse Bonita QuinLinda present in room to observe pt in tilting regimen.     Balance           Standing balance support: Bilateral upper extremity supported;During functional activity Standing balance-Leahy Scale: Zero Standing balance comment: flexes knees into straps on vital go bed; able to activate LE's pushing down to attempt straightening knees with cues for brief episodes                           ADL either performed or assessed with clinical judgement   ADL                                         General ADL Comments: pt NPO, total assist required for ADL     Vision       Perception     Praxis      Cognition Arousal/Alertness: Awake/alert Behavior During Therapy: Flat affect Overall Cognitive Status: Difficult to assess  General Comments: somewhat slow to respond        Exercises Exercises: General Upper Extremity  General Exercises - Upper Extremity Shoulder Flexion: AAROM;Both;5 reps;Seated Shoulder ABduction: AAROM;Both;5 reps;Supine Elbow Flexion: AROM;Both;10 reps;Seated Elbow Extension: Both;Seated;AROM;10 reps    Shoulder Instructions       General Comments      Pertinent Vitals/ Pain       Pain Assessment: No/denies pain  Home Living                                          Prior Functioning/Environment              Frequency  Min 2X/week        Progress Toward Goals  OT Goals(current goals can now be found in the care plan section)  Progress towards OT goals: Progressing toward  goals  Acute Rehab OT Goals Patient Stated Goal: to get better OT Goal Formulation: Patient unable to participate in goal setting Time For Goal Achievement: 11/05/17 Potential to Achieve Goals: Good  Plan Discharge plan remains appropriate    Co-evaluation    PT/OT/SLP Co-Evaluation/Treatment: Yes Reason for Co-Treatment: For patient/therapist safety;Complexity of the patient's impairments (multi-system involvement) PT goals addressed during session: Mobility/safety with mobility OT goals addressed during session: Strengthening/ROM      AM-PAC PT "6 Clicks" Daily Activity     Outcome Measure   Help from another person eating meals?: Total Help from another person taking care of personal grooming?: Total Help from another person toileting, which includes using toliet, bedpan, or urinal?: Total Help from another person bathing (including washing, rinsing, drying)?: Total Help from another person to put on and taking off regular upper body clothing?: Total Help from another person to put on and taking off regular lower body clothing?: Total 6 Click Score: 6    End of Session    OT Visit Diagnosis: Muscle weakness (generalized) (M62.81)   Activity Tolerance Patient tolerated treatment well   Patient Left in bed;with call bell/phone within reach;with nursing/sitter in room(bed in seated position)   Nurse Communication Other (comment)(use of Vital go bed)        Time: 1610-9604 OT Time Calculation (min): 49 min  Charges: OT General Charges $OT Visit: 1 Visit OT Treatments $Therapeutic Activity: 8-22 mins  10/29/2017 [name], OTR/L Pager: [phone] [place] [employer] 10/29/2017, 1:39 PM

## 2017-10-29 NOTE — Progress Notes (Signed)
Pt placed on ATC 40%, 12 L. Pt tolerating well at this time.

## 2017-10-29 NOTE — Progress Notes (Signed)
Nutrition Follow-up  DOCUMENTATION CODES:   Obesity unspecified  INTERVENTION:    Continue Vital High Protein via Cortrak tube at 65 ml/h (1560 ml/day) to provide 1560 kcals, 137 gm protein, 1304 ml free water daily.  NUTRITION DIAGNOSIS:   Inadequate oral intake related to inability to eat as evidenced by NPO status.  Ongoing  GOAL:   Provide needs based on ASPEN/SCCM guidelines  Met with TF  MONITOR:   Vent status, TF tolerance, Labs, I & O's  ASSESSMENT:   52 yo male with PMH of COPD, smoker, HIV, DM, depression, GERD, A fib who was admitted on 2/21 with acute on chronic hypoxic respiratory failure, COPD exacerbation, influenza A. Required intubation shortly after admission.  Discussed patient in ICU rounds and with RN today. Trach collar attempted today. Currently on vent support. Await[facility] at [facility]. He is receiving intermittent HD; Nephrology considering reducing the frequency of HD due to increased urine output.  Cortrak replaced 3/20. Patient is currently receiving Vital High Protein via Cortrak tube at 65 ml/h (1560 ml/day) to provide 1560 kcals, 137 gm protein, 1304 ml free water daily.  Patient remains on ventilator support. Temp (24hrs), Avg:98.8 F (37.1 C), Min:98.5 F (36.9 C), Max:99 F (37.2 C)  Labs reviewed. Sodium 131 (L), Potassium 4.3 (WNL), Phosphorus 7.3 (H), Magnesium 2.6 (H) CBG's: [phone] Medications reviewed and include Colace, Novolog, Lantus, Miralax. I/O + 9.8 L since 3/7  Diet Order:  Diet NPO time specified Except for: Sips with Meds  EDUCATION NEEDS:   No education needs have been identified at this time  Skin:  Skin Assessment: Reviewed RN Assessment  Last BM:  3/20 smear  Height:   Ht Readings from Last 1 Encounters:  10/01/17 '5\' 9"'  (1.753 m)    Weight:   Wt Readings from Last 1 Encounters:  10/29/17 213 lb 6.5 oz (96.8 kg)   10/22/17 232 lb 9.4 oz (105.5 kg)   Admission weight 263 lb 3.7 oz  (119.4 kg)  Ideal Body Weight:  72.7 kg  BMI:  Body mass index is 31.51 kg/m.  Estimated Nutritional Needs:   Kcal:  4715-8063  Protein:  145 gm  Fluid:  1.2 L    Molli Barrows, RD, LDN, [place] Pager [phone] After Hours Pager [phone]

## 2017-10-29 NOTE — Progress Notes (Addendum)
Background: 52 yo AAM HIV AF COPD MO Admitted w/resp failure, VDRF. + Flu A/PNA; possible PE; Baseline creatinine 0.79 10/01/17. AKI in setting of above issues. CRRT initiated 10/12/17 for massive anasarca /hyperkalemia. R IJ temp cath 3/3 by CCM.   Assessment/Recommendations 1. AKI - normal creatinine at baseline (0.79). CRRT 3/4-3/12. Now IHD...using temp cath from 3/3.  I think he is making more urine.  Will evaluate for HD in AM.  May want to reduce frequency. 2. VDRF/COPD/ARDS/FluA+ - s/pTamiflu/ATB's; on steroids -trachedConsider steroid taper 3. HIV - on meds per ID 4. Anemia-ESA and PRBCs  Subjective: Interval History: wants trach out.   Objective: Vital signs in last 24 hours: Temp:  [98.5 F (36.9 C)-99 F (37.2 C)] 99 F (37.2 C) (03/21 1148) Pulse Rate:  [73-104] 83 (03/21 1159) Resp:  [11-33] 15 (03/21 1159) BP: (112-169)/(60-91) 152/70 (03/21 1159) SpO2:  [90 %-98 %] 95 % (03/21 1159) FiO2 (%):  [40 %] 40 % (03/21 1159) Weight:  [96.8 kg (213 lb 6.5 oz)] 96.8 kg (213 lb 6.5 oz) (03/21 0500) Weight change: 0 kg (0 lb)  Intake/Output from previous day: 03/20 0701 - 03/21 0700 In: 2143 [I.V.:3; WNIOE:703; NG/GT:1495] Out: 5009 [Urine:225; Emesis/NG output:90] Intake/Output this shift: Total I/O In: 260 [NG/GT:260] Out: 60 [Urine:60]  General appearance: alert and cooperative Head: Normocephalic, without obvious abnormality, atraumatic, moon facies Throat: trached GI: protub  Lab Results: Recent Labs    10/28/17 0838 10/29/17 0435  WBC 8.3 6.3  HGB 7.9* 8.3*  HCT 25.0* 26.2*  PLT 344 295   BMET:  Recent Labs    10/28/17 0838 10/29/17 0435  NA 130* 131*  K 4.5 4.3  CL 90* 89*  CO2 26 27  GLUCOSE 174* 236*  BUN 99* 85*  CREATININE 5.56* 4.85*  CALCIUM 8.7* 8.8*   No results for input(s): PTH in the last 72 hours. Iron Studies: No results for input(s): IRON, TIBC, TRANSFERRIN, FERRITIN in the last 72 hours. Studies/Results: No results  found.  Scheduled: . amiodarone  200 mg Per Tube Daily  . chlorhexidine gluconate (MEDLINE KIT)  15 mL Mouth Rinse BID  . Chlorhexidine Gluconate Cloth  6 each Topical Daily  . clonazePAM  0.25 mg Per Tube Daily  . clonazePAM  0.5 mg Per Tube QHS  . diltiazem  60 mg Per Tube Q6H  . docusate  100 mg Per Tube BID  . dolutegravir  50 mg Oral Daily  . emtricitabine  200 mg Oral Once every 4 days  . famotidine  20 mg Per Tube QHS  . feeding supplement (VITAL HIGH PROTEIN)  1,000 mL Per Tube Q24H  . insulin aspart  0-20 Units Subcutaneous Q4H  . insulin glargine  20 Units Subcutaneous BID  . ipratropium-albuterol  3 mL Nebulization TID  . mouth rinse  15 mL Mouth Rinse QID  . methylPREDNISolone (SOLU-MEDROL) injection  60 mg Intravenous Q24H  . PARoxetine  20 mg Oral QHS  . polyethylene glycol  17 g Oral Daily  . scopolamine  1 patch Transdermal Q72H  . sennosides  5 mL Per Tube QHS  . sevelamer carbonate  2.4 g Per Tube TID WC  . sodium chloride flush  10-40 mL Intracatheter Q12H  . sodium chloride flush  3 mL Intravenous Q12H  . tenofovir  300 mg Oral Q Wed  . warfarin  7.5 mg Oral ONCE-1800  . Warfarin - Pharmacist Dosing Inpatient   Does not apply q1800   Continuous: . sodium chloride  Stopped (10/24/17 3799)  . sodium chloride    . sodium chloride Stopped (10/28/17 1058)  . dextrose Stopped (10/27/17 1227)      LOS: 28 days   Estanislado Emms 10/29/2017,12:30 PM

## 2017-10-29 NOTE — Progress Notes (Addendum)
PROGRESS NOTE    Chris [name].  QIO:[id number] DOB: Nov 23, 1965 DOA: 10/01/2017 PCP: System, Pcp Not In   Brief Narrative:  52 yo BM PMHx Depression HIV, A fib, COPD, and DM Type 2 without complication  Presented with dyspnea, cough, fever, and hypoxia and was found to have Influenza PNA causing ARDS. His hospital course has been complicated by hypervolemia and renal failure.     Subjective: 3/21 A/O x4, negative CP, positive S OB but significantly improved (able to talk over trach).  Tolerated significant amount of time on trach collar and PS today.  Requests when will he be able to eat food and have CORTrak tube removed.     Assessment & Plan:   Principal Problem:   Evaluation by psychiatric service required Active Problems:   COPD exacerbation ([place])   Acute on chronic respiratory failure with hypoxia (HCC)   Diabetes mellitus type 2 in obese (HCC)   COPD with exacerbation (HCC)   Atrial fibrillation (HCC)   HIV (human immunodeficiency virus infection) ([place])   Tobacco abuse   Bronchitis   AKI (acute kidney injury) ([place])   Endotracheally intubated   Influenza, pneumonia   Atrial fibrillation with RVR (HCC)   Elevated troponin   Goals of care, counseling/discussion   Palliative care by specialist   Palliative care encounter  Acute respiratory failure with hypoxia and hypercapnia/ARDS secondary positive influenza Pneumonia -failed weaning trial. Now with trach -3/21 tolerated SBT well today.  Was on trach collar for some time and then also spent some time on PS. -SBT daily: Currently patient not participating.much more upbeat today  -scopolamine patch has reduced secretions significantly.   COPD/Emphysema   -DuoNeb TID -3/21 begin taper: Decrease Solu-Medrol 40 mg daily  Purulent Tracheobronchitis -Completed course of antibiotics no ongoing evidence of infection  A. Fib with RVR -Currently in NSR -Amiodarone 200 mg daily -Cardizem 60 mg QID -Coumadin per  pharmacy Recent Labs  Lab 10/23/17 0402 10/24/17 0509 10/25/17 0338 10/26/17 0849 10/27/17 0411 10/28/17 0156 10/29/17 0435  INR 1.48 1.29 1.35 1.40 1.59 2.24 2.95  -currently subtherapeutic. Continue heparin Bridge until therapeutic  Acute on Chronic diastolic CHF -Strict in and outince admission +13.5 L -Daily weight  Filed Weights   10/28/17 0722 10/28/17 1122 10/29/17 0500  Weight: 216 lb 14.9 oz (98.4 kg) 208 lb 1.8 oz (94.4 kg) 213 lb 6.5 oz (96.8 kg)  -volume management per HD -transfuse for hemoglobin< 8 -3/20 transfuse 1 unit PRBC   Hypotension  Resolved - BP stable    Acute renal failure (baseline Cr 0.79) -ATN   -intermittent  HD per Nephrology -patient now accepting HD  -3/19 psychiatry consult: Per the progress note patient DOES have capacity to refuse dialysis   Hyperkalemia -Managed by HD   Anemia of Critical illness/Chronic disease  Recent Labs  Lab 10/23/17 0402 10/24/17 0509 10/25/17 0338 10/27/17 0411 10/28/17 0156 10/28/17 0838 10/29/17 0435  HGB 7.3* 8.4* 8.1* 7.8* 7.7* 7.9* 8.3*  -stable   Esophageal Candidiasis -Complete course of treatment   HIV -considering patient treated for esophageal candidiasis, will obtain updated CD4 count good possibility< 200 -HIV RNA quant; <20 -continue Emtriva + Tivicay    DM type 2 uncontrolled with complication -3/24 Hemoglobin A1c=10.5 -Lantus 20 units BID -resistant SSI   Acute metabolic encephalopathy -appears improved but difficult to tell patient on trach. -uremia most likely contributing. Hopefully will improve with HD -Resolved  Uremia Recent Labs  Lab 10/24/17 0509 10/25/17 4010 10/27/17 0411  10/28/17 0156 10/28/17 0838 10/29/17 0435 10/30/17 0407  BUN 58* 81* 131* 143* 99* 85* 117*  -Continue to monitor as patient goes through HD   Dysphagia -requested Swallow study 3/18  Depression -3/20 Paxil 20 mg daily  Goals of care -3/21 DO NOT recommend CIR given patient's  debilitated state do not believe would be able to participate sufficiently.  Awaiting LTAC bed at [facility].  Expect possible discharge on 3/22    DVT prophylaxis: heparin drip Code Status: Full Family Communication: None Disposition Plan: TBD   Consultants:  Nephrology Radiology    Procedures/Significant Events:  2/21 Admit 2/22 Intubated 2/27 ID consulted for persistent fever 2/28 Renal consult for AKI, Cardiology consult for A fib with RVR 3/01 Trach 3/03 R IJ HD cath - attempted HD 3/04 Changed to CRRT 3/05 Worsening hypoxia >> vent settings adjusted 3/12 CRRT stopped  3/13 first HD; PSV x 8 hrs    I have personally reviewed and interpreted all radiology studies and my findings are as above.  VENTILATOR SETTINGS:    Cultures   Antimicrobials: Anti-infectives (From admission, onward)   Start     Stop   10/24/17 2000  emtricitabine (EMTRIVA) capsule 200 mg         10/24/17 1018  ceFAZolin (ANCEF) 2-4 GM/100ML-% IVPB    Comments:  Hilma Favors   : cabinet override   10/24/17 1129   10/21/17 2000  tenofovir (VIREAD) tablet 300 mg         10/16/17 1200  piperacillin-tazobactam (ZOSYN) IVPB 3.375 g     10/20/17 1751   10/16/17 1000  emtricitabine (EMTRIVA) capsule 200 mg  Status:  Discontinued     10/21/17 0638   10/15/17 0900  vancomycin (VANCOCIN) IVPB 1000 mg/200 mL premix  Status:  Discontinued     10/15/17 1015   10/14/17 1200  piperacillin-tazobactam (ZOSYN) IVPB 3.375 g  Status:  Discontinued     10/16/17 0801   10/14/17 1000  tenofovir (VIREAD) tablet 300 mg  Status:  Discontinued     10/21/17 0638   10/14/17 0930  vancomycin (VANCOCIN) 2,500 mg in sodium chloride 0.9 % 500 mL IVPB     10/14/17 1159   10/14/17 0900  piperacillin-tazobactam (ZOSYN) IVPB 3.375 g  Status:  Discontinued     10/14/17 0905   10/12/17 0900  emtricitabine (EMTRIVA) capsule 200 mg  Status:  Discontinued     10/12/17 1036   10/11/17 0800  darunavir (PREZISTA) tablet 800 mg   Status:  Discontinued     10/10/17 1046   10/11/17 0800  ritonavir (NORVIR) tablet 100 mg  Status:  Discontinued     10/10/17 1046   10/07/17 1900  anidulafungin (ERAXIS) 100 mg in sodium chloride 0.9 % 100 mL IVPB     10/13/17 1954   10/07/17 1045  azithromycin (ZITHROMAX) 500 mg in sodium chloride 0.9 % 250 mL IVPB  Status:  Discontinued     10/09/17 0916   10/05/17 1000  oseltamivir (TAMIFLU) 6 MG/ML suspension 75 mg     10/06/17 1120   10/03/17 2200  oseltamivir (TAMIFLU) 6 MG/ML suspension 30 mg  Status:  Discontinued     10/05/17 0745   10/03/17 0400  fluconazole (DIFLUCAN) IVPB 100 mg  Status:  Discontinued     10/07/17 1758   10/02/17 1200  dolutegravir (TIVICAY) tablet 50 mg         10/02/17 1200  emtricitabine-tenofovir AF (DESCOVY) 200-25 MG per tablet 1 tablet  Status:  Discontinued     10/10/17 1046   10/02/17 1000  oseltamivir (TAMIFLU) 6 MG/ML suspension 75 mg  Status:  Discontinued     10/03/17 1550   10/02/17 0900  vancomycin (VANCOCIN) IVPB 1000 mg/200 mL premix  Status:  Discontinued     10/02/17 1053   10/02/17 0800  vancomycin (VANCOCIN) IVPB 1000 mg/200 mL premix  Status:  Discontinued     10/01/17 2009   10/02/17 0600  ceFEPIme (MAXIPIME) 2 g in sodium chloride 0.9 % 100 mL IVPB  Status:  Discontinued     10/08/17 0839   10/02/17 0400  fluconazole (DIFLUCAN) IVPB 200 mg     10/02/17 0603   10/01/17 2200  ceFEPIme (MAXIPIME) 1 g in sodium chloride 0.9 % 100 mL IVPB  Status:  Discontinued     10/01/17 1841   10/01/17 2030  vancomycin (VANCOCIN) 2,500 mg in sodium chloride 0.9 % 500 mL IVPB     10/01/17 2355   10/01/17 2015  ceFEPIme (MAXIPIME) 2 g in sodium chloride 0.9 % 100 mL IVPB  Status:  Discontinued     10/01/17 2006   10/01/17 2015  vancomycin (VANCOCIN) IVPB 1000 mg/200 mL premix  Status:  Discontinued     10/01/17 2006   10/01/17 2000  ceFEPIme (MAXIPIME) 1 g in sodium chloride 0.9 % 100 mL IVPB  Status:  Discontinued     10/01/17 2010   10/01/17  1900  vancomycin (VANCOCIN) 2,500 mg in sodium chloride 0.9 % 500 mL IVPB  Status:  Discontinued     10/01/17 2009   10/01/17 1830  oseltamivir (TAMIFLU) capsule 75 mg  Status:  Discontinued     10/02/17 0738   10/01/17 1815  bictegravir-emtricitabine-tenofovir AF (BIKTARVY) 50-200-25 MG per tablet 1 tablet  Status:  Discontinued     10/02/17 1059   10/01/17 1730  levofloxacin (LEVAQUIN) tablet 500 mg  Status:  Discontinued     10/01/17 1809       Devices    LINES / TUBES:      Continuous Infusions: . sodium chloride Stopped (10/24/17 0508)  . sodium chloride    . sodium chloride Stopped (10/28/17 1058)  . dextrose Stopped (10/27/17 1227)     Objective: Vitals:   10/29/17 0500 10/29/17 0700 10/29/17 0757 10/29/17 0814  BP:  114/64  (!) 150/79  Pulse:  74  80  Resp:  17  20  Temp:   98.8 F (37.1 C)   TempSrc:   Oral   SpO2:  93%  94%  Weight: 213 lb 6.5 oz (96.8 kg)     Height:        Intake/Output Summary (Last 24 hours) at 10/29/2017 0824 Last data filed at 10/29/2017 0800 Gross per 24 hour  Intake 2143 ml  Output 4375 ml  Net -2232 ml   Filed Weights   10/28/17 0722 10/28/17 1122 10/29/17 0500  Weight: 216 lb 14.9 oz (98.4 kg) 208 lb 1.8 oz (94.4 kg) 213 lb 6.5 oz (96.8 kg)    Physical Exam:  General: alert, follows commands, positive acute respiratory distress, tolerating HD Neck:  Negative scars, masses, torticollis, lymphadenopathy, JVD,#6 cuffed trach in place, covered and clean negative sign of infection Lungs: Clear to auscultation bilaterally without wheezes or crackles, copious clear secretion suctioned from trachea. Cardiovascular: Regular rate and rhythm without murmur gallop or rub normal S1 and S2 Abdomen: negative abdominal pain, nondistended, positive soft, bowel sounds, no rebound, no ascites, no appreciable mass Extremities: No significant  cyanosis, clubbing, or edema bilateral lower extremities Skin: Negative rashes, lesions,  ulcers Psychiatric:   Positive depression, negative anxiety, negative fatigue, negative mania  Central nervous system:  Cranial nerves II through XII intact, tongue/uvula midline, bilateral upper extremity strength 4/5, bilateral lower extreme strength 2/5, sensation intact throughout,negative receptive aphasia.   .     Data Reviewed: Care during the described time interval was provided by me .  I have reviewed this patient's available data, including medical history, events of note, physical examination, and all test results as part of my evaluation.   CBC: Recent Labs  Lab 10/25/17 0338 10/27/17 0411 10/28/17 0156 10/28/17 0838 10/29/17 0435  WBC 11.4* 9.1 7.8 8.3 6.3  HGB 8.1* 7.8* 7.7* 7.9* 8.3*  HCT 26.1* 24.2* 23.7* 25.0* 26.2*  MCV 93.9 92.4 92.9 92.9 95.6  PLT 321 328 327 344 536   Basic Metabolic Panel: Recent Labs  Lab 10/25/17 0338 10/27/17 0411 10/28/17 0156 10/28/17 0838 10/29/17 0435  NA 131* 125* 125* 130* 131*  K 4.5 6.8* 6.5* 4.5 4.3  CL 94* 84* 84* 90* 89*  CO2 24 21* 21* 26 27  GLUCOSE 123* 201* 154* 174* 236*  BUN 81* 131* 143* 99* 85*  CREATININE 5.60* 7.64* 8.14* 5.56* 4.85*  CALCIUM 8.7* 8.9 8.8* 8.7* 8.8*  MG  --  3.2* 3.2*  --  2.6*  PHOS 9.1* 10.0* 10.9* 7.4* 7.3*   GFR: Estimated Creatinine Clearance: 20.7 mL/min (A) (by C-G formula based on SCr of 4.85 mg/dL (H)). Liver Function Tests: Recent Labs  Lab 10/24/17 0509 10/25/17 0338 10/27/17 0411 10/28/17 0838 10/29/17 0435  ALBUMIN 2.1* 2.1* 2.3* 2.4* 2.3*   No results for input(s): LIPASE, AMYLASE in the last 168 hours. No results for input(s): AMMONIA in the last 168 hours. Coagulation Profile: Recent Labs  Lab 10/25/17 0338 10/26/17 0849 10/27/17 0411 10/28/17 0156 10/29/17 0435  INR 1.35 1.40 1.59 2.24 2.95   Cardiac Enzymes: No results for input(s): CKTOTAL, CKMB, CKMBINDEX, TROPONINI in the last 168 hours. BNP (last 3 results) No results for input(s): PROBNP in the  last 8760 hours. HbA1C: No results for input(s): HGBA1C in the last 72 hours. CBG: Recent Labs  Lab 10/28/17 1117 10/28/17 1539 10/28/17 2005 10/28/17 2343 10/29/17 0337  GLUCAP 154* 94 112* 138* 238*   Lipid Profile: No results for input(s): CHOL, HDL, LDLCALC, TRIG, CHOLHDL, LDLDIRECT in the last 72 hours. Thyroid Function Tests: No results for input(s): TSH, T4TOTAL, FREET4, T3FREE, THYROIDAB in the last 72 hours. Anemia Panel: No results for input(s): VITAMINB12, FOLATE, FERRITIN, TIBC, IRON, RETICCTPCT in the last 72 hours. Urine analysis:    Component Value Date/Time   COLORURINE AMBER (A) 10/14/2017 0844   APPEARANCEUR CLOUDY (A) 10/14/2017 0844   LABSPEC 1.015 10/14/2017 0844   PHURINE 5.0 10/14/2017 0844   GLUCOSEU 50 (A) 10/14/2017 0844   HGBUR LARGE (A) 10/14/2017 0844   BILIRUBINUR NEGATIVE 10/14/2017 0844   BILIRUBINUR neg 11/12/2016 0946   KETONESUR NEGATIVE 10/14/2017 0844   PROTEINUR 100 (A) 10/14/2017 0844   UROBILINOGEN 1.0 11/12/2016 0946   UROBILINOGEN 1 09/21/2014 1205   NITRITE NEGATIVE 10/14/2017 0844   LEUKOCYTESUR NEGATIVE 10/14/2017 0844   Sepsis Labs: '@LABRCNTIP' (procalcitonin:4,lacticidven:4)  )No results found for this or any previous visit (from the past 240 hour(s)).       Radiology Studies: Dg Abd Portable 1v  Result Date: 10/27/2017 CLINICAL DATA:  Check feeding catheter placement EXAM: PORTABLE ABDOMEN - 1 VIEW COMPARISON:  None. FINDINGS: Nasogastric  catheter is noted extending into the stomach. The proximal side port is noted at the level of the GE junction. This should be advanced several cm. IMPRESSION: Nasogastric catheter as described. This should be advanced several cm. Electronically Signed   By: Inez Catalina M.D.   On: 10/27/2017 12:18        Scheduled Meds: . amiodarone  200 mg Per Tube Daily  . chlorhexidine gluconate (MEDLINE KIT)  15 mL Mouth Rinse BID  . Chlorhexidine Gluconate Cloth  6 each Topical Daily  .  clonazePAM  0.25 mg Per Tube Daily  . clonazePAM  0.5 mg Per Tube QHS  . diltiazem  60 mg Per Tube Q6H  . docusate  100 mg Per Tube BID  . dolutegravir  50 mg Oral Daily  . emtricitabine  200 mg Oral Once every 4 days  . famotidine  20 mg Per Tube QHS  . feeding supplement (VITAL HIGH PROTEIN)  1,000 mL Per Tube Q24H  . insulin aspart  0-20 Units Subcutaneous Q4H  . insulin glargine  20 Units Subcutaneous BID  . ipratropium-albuterol  3 mL Nebulization TID  . mouth rinse  15 mL Mouth Rinse QID  . methylPREDNISolone (SOLU-MEDROL) injection  60 mg Intravenous Q24H  . PARoxetine  20 mg Oral QHS  . polyethylene glycol  17 g Oral Daily  . scopolamine  1 patch Transdermal Q72H  . sennosides  5 mL Per Tube QHS  . sevelamer carbonate  2.4 g Per Tube TID WC  . sodium chloride flush  10-40 mL Intracatheter Q12H  . sodium chloride flush  3 mL Intravenous Q12H  . tenofovir  300 mg Oral Q Wed  . Warfarin - Pharmacist Dosing Inpatient   Does not apply q1800   Continuous Infusions: . sodium chloride Stopped (10/24/17 0508)  . sodium chloride    . sodium chloride Stopped (10/28/17 1058)  . dextrose Stopped (10/27/17 1227)     LOS: 28 days    Time spent: 40 minutes    Merly Hinkson, Geraldo Docker, MD [facility] Pager [phone]   If 7PM-7AM, please contact night-coverage [email or web address] Password [facility] 10/29/2017, 8:24 AM

## 2017-10-29 NOTE — Progress Notes (Signed)
ANTICOAGULATION CONSULT NOTE - Follow Up Consult  Pharmacy Consult:  Heparin/warfarin Indication: pulmonary embolus/Afib  Allergies  Allergen Reactions  . Bactrim [Sulfamethoxazole-Trimethoprim] Hives    Patient Measurements: Height: 5\' 9"  (175.3 cm) Weight: 213 lb 6.5 oz (96.8 kg) IBW/kg (Calculated) : 70.7 Heparin dosing weight: 97 kg  Vital Signs: Temp: 98.8 F (37.1 C) (03/21 0757) Temp Source: Oral (03/21 0757) BP: 145/69 (03/21 0900) Pulse Rate: 84 (03/21 0900)  Labs: Recent Labs    10/27/17 0411 10/28/17 0156 10/28/17 0838 10/29/17 0435  HGB 7.8* 7.7* 7.9* 8.3*  HCT 24.2* 23.7* 25.0* 26.2*  PLT 328 327 344 295  LABPROT 18.8* 24.6*  --  30.5*  INR 1.59 2.24  --  2.95  HEPARINUNFRC 0.55 0.25*  --   --   CREATININE 7.64* 8.14* 5.56* 4.85*    Estimated Creatinine Clearance: 20.7 mL/min (A) (by C-G formula based on SCr of 4.85 mg/dL (H)).  Assessment: [age] YOM with Afib on Eliquis PTA, transitioned to warfarin given possible PE on Eliquis, s/p heparin bridge. INR is therapeutic today 2.95, trending up significantly over past 2 days. Patient previously required high doses (10-15 mg) of warfarin, INR was slow to rise to goal range. I anticipate the INR will trend back down on lower doses, his maintenance dose is likely ~10 mg daily. HGB is 8.3, trending up s/p 2 units PRBC yesterday, PLT are wnl. No bleeding noted.    Goal of Therapy:  INR 2-3  Monitor platelets by anticoagulation protocol: Yes   Plan: Warfarin 7.5 mg PO x 1 tonight Daily INR Monitor CBC, s/sx of bleeding   Al CorpusLindsey Lua Feng, PharmD PGY1 Pharmacy Resident CCM pharmacy phone: [phone] Pager: [phone] 10/29/2017 9:47 AM

## 2017-10-30 ENCOUNTER — Encounter (HOSPITAL_COMMUNITY): Payer: Self-pay

## 2017-10-30 DIAGNOSIS — E118 Type 2 diabetes mellitus with unspecified complications: Secondary | ICD-10-CM

## 2017-10-30 DIAGNOSIS — B3781 Candidal esophagitis: Secondary | ICD-10-CM

## 2017-10-30 DIAGNOSIS — Z4659 Encounter for fitting and adjustment of other gastrointestinal appliance and device: Secondary | ICD-10-CM | POA: Diagnosis not present

## 2017-10-30 DIAGNOSIS — T82598A Other mechanical complication of other cardiac and vascular devices and implants, initial encounter: Secondary | ICD-10-CM | POA: Diagnosis not present

## 2017-10-30 DIAGNOSIS — M545 Low back pain: Secondary | ICD-10-CM | POA: Diagnosis not present

## 2017-10-30 DIAGNOSIS — Y848 Other medical procedures as the cause of abnormal reaction of the patient, or of later complication, without mention of misadventure at the time of the procedure: Secondary | ICD-10-CM | POA: Diagnosis not present

## 2017-10-30 DIAGNOSIS — J431 Panlobular emphysema: Secondary | ICD-10-CM | POA: Diagnosis not present

## 2017-10-30 DIAGNOSIS — I10 Essential (primary) hypertension: Secondary | ICD-10-CM | POA: Diagnosis not present

## 2017-10-30 DIAGNOSIS — IMO0002 Reserved for concepts with insufficient information to code with codable children: Secondary | ICD-10-CM

## 2017-10-30 DIAGNOSIS — R1312 Dysphagia, oropharyngeal phase: Secondary | ICD-10-CM | POA: Diagnosis not present

## 2017-10-30 DIAGNOSIS — J9601 Acute respiratory failure with hypoxia: Secondary | ICD-10-CM | POA: Diagnosis not present

## 2017-10-30 DIAGNOSIS — E1165 Type 2 diabetes mellitus with hyperglycemia: Secondary | ICD-10-CM

## 2017-10-30 DIAGNOSIS — E1140 Type 2 diabetes mellitus with diabetic neuropathy, unspecified: Secondary | ICD-10-CM | POA: Diagnosis not present

## 2017-10-30 DIAGNOSIS — D649 Anemia, unspecified: Secondary | ICD-10-CM | POA: Diagnosis not present

## 2017-10-30 DIAGNOSIS — D631 Anemia in chronic kidney disease: Secondary | ICD-10-CM | POA: Diagnosis not present

## 2017-10-30 DIAGNOSIS — T8249XA Other complication of vascular dialysis catheter, initial encounter: Secondary | ICD-10-CM | POA: Diagnosis not present

## 2017-10-30 DIAGNOSIS — J1000 Influenza due to other identified influenza virus with unspecified type of pneumonia: Secondary | ICD-10-CM | POA: Diagnosis not present

## 2017-10-30 DIAGNOSIS — R4702 Dysphasia: Secondary | ICD-10-CM

## 2017-10-30 DIAGNOSIS — E873 Alkalosis: Secondary | ICD-10-CM | POA: Diagnosis not present

## 2017-10-30 DIAGNOSIS — F320 Major depressive disorder, single episode, mild: Secondary | ICD-10-CM

## 2017-10-30 DIAGNOSIS — R140 Abdominal distension (gaseous): Secondary | ICD-10-CM | POA: Diagnosis not present

## 2017-10-30 DIAGNOSIS — I5033 Acute on chronic diastolic (congestive) heart failure: Secondary | ICD-10-CM | POA: Diagnosis not present

## 2017-10-30 DIAGNOSIS — J449 Chronic obstructive pulmonary disease, unspecified: Secondary | ICD-10-CM | POA: Diagnosis not present

## 2017-10-30 DIAGNOSIS — M6281 Muscle weakness (generalized): Secondary | ICD-10-CM | POA: Diagnosis not present

## 2017-10-30 DIAGNOSIS — J9382 Other air leak: Secondary | ICD-10-CM | POA: Diagnosis not present

## 2017-10-30 DIAGNOSIS — I480 Paroxysmal atrial fibrillation: Secondary | ICD-10-CM | POA: Diagnosis not present

## 2017-10-30 DIAGNOSIS — Z9981 Dependence on supplemental oxygen: Secondary | ICD-10-CM | POA: Diagnosis not present

## 2017-10-30 DIAGNOSIS — F419 Anxiety disorder, unspecified: Secondary | ICD-10-CM | POA: Diagnosis not present

## 2017-10-30 DIAGNOSIS — J80 Acute respiratory distress syndrome: Secondary | ICD-10-CM

## 2017-10-30 DIAGNOSIS — B999 Unspecified infectious disease: Secondary | ICD-10-CM | POA: Diagnosis not present

## 2017-10-30 DIAGNOSIS — J9811 Atelectasis: Secondary | ICD-10-CM | POA: Diagnosis not present

## 2017-10-30 DIAGNOSIS — K219 Gastro-esophageal reflux disease without esophagitis: Secondary | ICD-10-CM | POA: Diagnosis not present

## 2017-10-30 DIAGNOSIS — I130 Hypertensive heart and chronic kidney disease with heart failure and stage 1 through stage 4 chronic kidney disease, or unspecified chronic kidney disease: Secondary | ICD-10-CM | POA: Diagnosis not present

## 2017-10-30 DIAGNOSIS — D6489 Other specified anemias: Secondary | ICD-10-CM | POA: Diagnosis not present

## 2017-10-30 DIAGNOSIS — E119 Type 2 diabetes mellitus without complications: Secondary | ICD-10-CM | POA: Diagnosis not present

## 2017-10-30 DIAGNOSIS — Z008 Encounter for other general examination: Secondary | ICD-10-CM | POA: Diagnosis not present

## 2017-10-30 DIAGNOSIS — J9622 Acute and chronic respiratory failure with hypercapnia: Secondary | ICD-10-CM | POA: Diagnosis not present

## 2017-10-30 DIAGNOSIS — E872 Acidosis: Secondary | ICD-10-CM | POA: Diagnosis not present

## 2017-10-30 DIAGNOSIS — N189 Chronic kidney disease, unspecified: Secondary | ICD-10-CM | POA: Diagnosis not present

## 2017-10-30 DIAGNOSIS — E1122 Type 2 diabetes mellitus with diabetic chronic kidney disease: Secondary | ICD-10-CM | POA: Diagnosis not present

## 2017-10-30 DIAGNOSIS — N179 Acute kidney failure, unspecified: Secondary | ICD-10-CM | POA: Diagnosis not present

## 2017-10-30 DIAGNOSIS — N19 Unspecified kidney failure: Secondary | ICD-10-CM | POA: Diagnosis not present

## 2017-10-30 DIAGNOSIS — R498 Other voice and resonance disorders: Secondary | ICD-10-CM | POA: Diagnosis not present

## 2017-10-30 DIAGNOSIS — E875 Hyperkalemia: Secondary | ICD-10-CM | POA: Diagnosis not present

## 2017-10-30 DIAGNOSIS — M79606 Pain in leg, unspecified: Secondary | ICD-10-CM | POA: Diagnosis not present

## 2017-10-30 DIAGNOSIS — R2689 Other abnormalities of gait and mobility: Secondary | ICD-10-CM | POA: Diagnosis not present

## 2017-10-30 DIAGNOSIS — J129 Viral pneumonia, unspecified: Secondary | ICD-10-CM | POA: Diagnosis not present

## 2017-10-30 DIAGNOSIS — J9611 Chronic respiratory failure with hypoxia: Secondary | ICD-10-CM | POA: Diagnosis not present

## 2017-10-30 DIAGNOSIS — J9600 Acute respiratory failure, unspecified whether with hypoxia or hypercapnia: Secondary | ICD-10-CM | POA: Diagnosis not present

## 2017-10-30 DIAGNOSIS — J209 Acute bronchitis, unspecified: Secondary | ICD-10-CM | POA: Diagnosis not present

## 2017-10-30 DIAGNOSIS — E871 Hypo-osmolality and hyponatremia: Secondary | ICD-10-CM | POA: Diagnosis not present

## 2017-10-30 DIAGNOSIS — B20 Human immunodeficiency virus [HIV] disease: Secondary | ICD-10-CM | POA: Diagnosis not present

## 2017-10-30 DIAGNOSIS — G894 Chronic pain syndrome: Secondary | ICD-10-CM | POA: Diagnosis not present

## 2017-10-30 DIAGNOSIS — Z515 Encounter for palliative care: Secondary | ICD-10-CM | POA: Diagnosis not present

## 2017-10-30 DIAGNOSIS — R1310 Dysphagia, unspecified: Secondary | ICD-10-CM | POA: Diagnosis not present

## 2017-10-30 DIAGNOSIS — R69 Illness, unspecified: Secondary | ICD-10-CM | POA: Diagnosis not present

## 2017-10-30 DIAGNOSIS — J9621 Acute and chronic respiratory failure with hypoxia: Secondary | ICD-10-CM | POA: Diagnosis not present

## 2017-10-30 DIAGNOSIS — I482 Chronic atrial fibrillation: Secondary | ICD-10-CM | POA: Diagnosis not present

## 2017-10-30 DIAGNOSIS — G9341 Metabolic encephalopathy: Secondary | ICD-10-CM

## 2017-10-30 DIAGNOSIS — J441 Chronic obstructive pulmonary disease with (acute) exacerbation: Secondary | ICD-10-CM | POA: Diagnosis not present

## 2017-10-30 DIAGNOSIS — Z9911 Dependence on respirator [ventilator] status: Secondary | ICD-10-CM | POA: Diagnosis not present

## 2017-10-30 DIAGNOSIS — Z6841 Body Mass Index (BMI) 40.0 and over, adult: Secondary | ICD-10-CM | POA: Diagnosis not present

## 2017-10-30 DIAGNOSIS — N183 Chronic kidney disease, stage 3 (moderate): Secondary | ICD-10-CM | POA: Diagnosis not present

## 2017-10-30 DIAGNOSIS — R262 Difficulty in walking, not elsewhere classified: Secondary | ICD-10-CM | POA: Diagnosis not present

## 2017-10-30 DIAGNOSIS — Y95 Nosocomial condition: Secondary | ICD-10-CM | POA: Diagnosis not present

## 2017-10-30 DIAGNOSIS — E870 Hyperosmolality and hypernatremia: Secondary | ICD-10-CM | POA: Diagnosis not present

## 2017-10-30 DIAGNOSIS — F329 Major depressive disorder, single episode, unspecified: Secondary | ICD-10-CM | POA: Diagnosis not present

## 2017-10-30 DIAGNOSIS — N170 Acute kidney failure with tubular necrosis: Secondary | ICD-10-CM | POA: Diagnosis not present

## 2017-10-30 DIAGNOSIS — I4891 Unspecified atrial fibrillation: Secondary | ICD-10-CM | POA: Diagnosis not present

## 2017-10-30 DIAGNOSIS — R5381 Other malaise: Secondary | ICD-10-CM | POA: Diagnosis not present

## 2017-10-30 DIAGNOSIS — J439 Emphysema, unspecified: Secondary | ICD-10-CM | POA: Diagnosis not present

## 2017-10-30 LAB — GLUCOSE, CAPILLARY
GLUCOSE-CAPILLARY: 122 mg/dL — AB (ref 65–99)
GLUCOSE-CAPILLARY: 213 mg/dL — AB (ref 65–99)
Glucose-Capillary: 196 mg/dL — ABNORMAL HIGH (ref 65–99)
Glucose-Capillary: 156 mg/dL — ABNORMAL HIGH (ref 65–99)
Glucose-Capillary: 97 mg/dL (ref 65–99)
Glucose-Capillary: 136 mg/dL — ABNORMAL HIGH (ref 65–99)

## 2017-10-30 LAB — CBC
HCT: 25.2 % — ABNORMAL LOW (ref 39.0–52.0)
HEMOGLOBIN: 7.9 g/dL — AB (ref 13.0–17.0)
MCH: 29.6 pg (ref 26.0–34.0)
MCHC: 31.3 g/dL (ref 30.0–36.0)
MCV: 94.4 fL (ref 78.0–100.0)
Platelets: 293 10*3/uL (ref 150–400)
RBC: 2.67 MIL/uL — AB (ref 4.22–5.81)
RDW: 15.9 % — ABNORMAL HIGH (ref 11.5–15.5)
WBC: 7.3 10*3/uL (ref 4.0–10.5)

## 2017-10-30 LAB — IRON AND TIBC
IRON: 46 ug/dL (ref 45–182)
Saturation Ratios: 16 % — ABNORMAL LOW (ref 17.9–39.5)
TIBC: 280 ug/dL (ref 250–450)
UIBC: 234 ug/dL

## 2017-10-30 LAB — RENAL FUNCTION PANEL
Albumin: 2.2 g/dL — ABNORMAL LOW (ref 3.5–5.0)
Anion gap: 18 — ABNORMAL HIGH (ref 5–15)
BUN: 117 mg/dL — ABNORMAL HIGH (ref 6–20)
CHLORIDE: 89 mmol/L — AB (ref 101–111)
CO2: 25 mmol/L (ref 22–32)
CREATININE: 5.44 mg/dL — AB (ref 0.61–1.24)
Calcium: 8.8 mg/dL — ABNORMAL LOW (ref 8.9–10.3)
GFR, EST AFRICAN AMERICAN: 13 mL/min — AB (ref >60)
GFR, EST NON AFRICAN AMERICAN: 11 mL/min — AB (ref >60)
Glucose, Bld: 222 mg/dL — ABNORMAL HIGH (ref 65–99)
Phosphorus: 9 mg/dL — ABNORMAL HIGH (ref 2.5–4.6)
Potassium: 4.5 mmol/L (ref 3.5–5.1)
Sodium: 132 mmol/L — ABNORMAL LOW (ref 135–145)

## 2017-10-30 LAB — RETICULOCYTES
RBC.: 2.76 MIL/uL — ABNORMAL LOW (ref 4.22–5.81)
Retic Count, Absolute: 80 10*3/uL (ref 19.0–186.0)
Retic Ct Pct: 2.9 % (ref 0.4–3.1)

## 2017-10-30 LAB — VITAMIN B12: Vitamin B-12: 2736 pg/mL — ABNORMAL HIGH (ref 180–914)

## 2017-10-30 LAB — PROTIME-INR
INR: 2.91
PROTHROMBIN TIME: 30.1 s — AB (ref 11.4–15.2)

## 2017-10-30 LAB — MAGNESIUM: MAGNESIUM: 2.7 mg/dL — AB (ref 1.7–2.4)

## 2017-10-30 LAB — FOLATE: Folate: 13 ng/mL (ref >5.9)

## 2017-10-30 LAB — FERRITIN: FERRITIN: 766 ng/mL — AB (ref 24–336)

## 2017-10-30 MED ORDER — OXYCODONE HCL 5 MG/5ML PO SOLN: 5.0000 mg | ORAL | 0 refills | Status: DC | PRN

## 2017-10-30 MED ORDER — POLYETHYLENE GLYCOL 3350 17 G PO PACK: 17.0000 g | PACK | Freq: Every day | ORAL | 0 refills | Status: DC

## 2017-10-30 MED ORDER — SCOPOLAMINE 1 MG/3DAYS TD PT72: 1.0000 | MEDICATED_PATCH | TRANSDERMAL | 0 refills | Status: DC

## 2017-10-30 MED ORDER — INSULIN GLARGINE 100 UNIT/ML ~~LOC~~ SOLN: 20.0000 [IU] | Freq: Two times a day (BID) | SUBCUTANEOUS | 0 refills | Status: DC

## 2017-10-30 MED ORDER — SENNOSIDES 8.8 MG/5ML PO SYRP: 5.0000 mL | ORAL_SOLUTION | Freq: Every day | ORAL | 0 refills | Status: DC

## 2017-10-30 MED ORDER — ACETAMINOPHEN 160 MG/5ML PO SOLN: 650.0000 mg | Freq: Four times a day (QID) | ORAL | 0 refills | Status: DC | PRN

## 2017-10-30 MED ORDER — EMTRICITABINE 200 MG PO CAPS: ORAL_CAPSULE | ORAL | 0 refills | Status: DC

## 2017-10-30 MED ORDER — PAROXETINE HCL 20 MG PO TABS: 20.0000 mg | ORAL_TABLET | Freq: Every day | ORAL | 0 refills | Status: DC

## 2017-10-30 MED ORDER — LIDOCAINE HCL (PF) 1 % IJ SOLN: 5.0000 mL | INTRAMUSCULAR | 0 refills | Status: DC | PRN

## 2017-10-30 MED ORDER — LIDOCAINE-PRILOCAINE 2.5-2.5 % EX CREA: 1.0000 "application " | TOPICAL_CREAM | CUTANEOUS | 0 refills | Status: DC | PRN

## 2017-10-30 MED ORDER — TENOFOVIR DISOPROXIL FUMARATE 300 MG PO TABS: 300.0000 mg | ORAL_TABLET | ORAL | 0 refills | Status: DC

## 2017-10-30 MED ORDER — WARFARIN SODIUM 10 MG PO TABS: 10.0000 mg | ORAL_TABLET | Freq: Once | ORAL | 0 refills | Status: DC

## 2017-10-30 MED ORDER — DOCUSATE SODIUM 50 MG/5ML PO LIQD: 100.0000 mg | Freq: Two times a day (BID) | ORAL | 0 refills | Status: DC

## 2017-10-30 MED ORDER — IPRATROPIUM-ALBUTEROL 0.5-2.5 (3) MG/3ML IN SOLN: 3.0000 mL | Freq: Three times a day (TID) | RESPIRATORY_TRACT | 0 refills | Status: DC

## 2017-10-30 MED ORDER — INSULIN ASPART 100 UNIT/ML ~~LOC~~ SOLN: 0.0000 [IU] | SUBCUTANEOUS | 0 refills | Status: DC

## 2017-10-30 MED ORDER — CLONAZEPAM 0.5 MG PO TABS: 0.5000 mg | ORAL_TABLET | Freq: Every day | ORAL | 0 refills | Status: DC

## 2017-10-30 MED ORDER — WARFARIN SODIUM 10 MG PO TABS
10.0000 mg | ORAL_TABLET | Freq: Once | ORAL | Status: AC
  Administered 2017-10-30: 10 mg via ORAL
  Filled 2017-10-30: qty 1

## 2017-10-30 MED ORDER — FAMOTIDINE 40 MG/5ML PO SUSR: 20.0000 mg | Freq: Every day | ORAL | 0 refills | Status: DC

## 2017-10-30 MED ORDER — BISACODYL 10 MG RE SUPP: 10.0000 mg | Freq: Every day | RECTAL | 0 refills | Status: DC | PRN

## 2017-10-30 MED ORDER — TRAMADOL HCL 50 MG PO TABS: 50.0000 mg | ORAL_TABLET | Freq: Two times a day (BID) | ORAL | 0 refills | Status: DC | PRN

## 2017-10-30 MED ORDER — CLONAZEPAM 0.25 MG PO TBDP: 0.2500 mg | ORAL_TABLET | Freq: Every day | ORAL | 0 refills | Status: DC

## 2017-10-30 MED ORDER — METHYLPREDNISOLONE SODIUM SUCC 40 MG IJ SOLR: 40.0000 mg | INTRAMUSCULAR | 0 refills | Status: DC

## 2017-10-30 MED ORDER — AMIODARONE HCL 200 MG PO TABS: 200.0000 mg | ORAL_TABLET | Freq: Every day | ORAL | 0 refills | Status: DC

## 2017-10-30 MED ORDER — DILTIAZEM 12 MG/ML ORAL SUSPENSION: 60.0000 mg | Freq: Four times a day (QID) | ORAL | 0 refills | Status: DC

## 2017-10-30 MED ORDER — PENTAFLUOROPROP-TETRAFLUOROETH EX AERO: 1.0000 "application " | INHALATION_SPRAY | CUTANEOUS | 0 refills | Status: DC | PRN

## 2017-10-30 MED ORDER — SEVELAMER CARBONATE 2.4 G PO PACK: 2.4000 g | PACK | Freq: Three times a day (TID) | ORAL | 0 refills | Status: DC

## 2017-10-30 MED ORDER — DARBEPOETIN ALFA 100 MCG/0.5ML IJ SOSY: 100.0000 ug | PREFILLED_SYRINGE | Freq: Once | INTRAMUSCULAR | 0 refills | Status: AC

## 2017-10-30 MED ORDER — DARBEPOETIN ALFA 100 MCG/0.5ML IJ SOSY
PREFILLED_SYRINGE | INTRAMUSCULAR | Status: AC
  Administered 2017-10-30: 100 ug via SUBCUTANEOUS
  Filled 2017-10-30: qty 0.5

## 2017-10-30 MED ORDER — METOPROLOL TARTRATE 5 MG/5ML IV SOLN: 2.5000 mg | INTRAVENOUS | 0 refills | Status: DC | PRN

## 2017-10-30 MED ORDER — DOLUTEGRAVIR SODIUM 50 MG PO TABS: 50.0000 mg | ORAL_TABLET | Freq: Every day | ORAL | 0 refills | Status: DC

## 2017-10-30 MED ORDER — VITAL HIGH PROTEIN PO LIQD: 1000.0000 mL | ORAL | 0 refills | Status: DC

## 2017-10-30 MED ORDER — ONDANSETRON HCL 4 MG/2ML IJ SOLN: 4.0000 mg | Freq: Four times a day (QID) | INTRAMUSCULAR | 0 refills | Status: DC | PRN

## 2017-10-30 MED ORDER — MAGNESIUM CITRATE PO SOLN
0.5000 | Freq: Once | ORAL | Status: DC
  Filled 2017-10-30: qty 296

## 2017-10-30 MED ORDER — DARBEPOETIN ALFA 100 MCG/0.5ML IJ SOSY
100.0000 ug | PREFILLED_SYRINGE | Freq: Once | INTRAMUSCULAR | Status: AC
  Administered 2017-10-30: 100 ug via SUBCUTANEOUS
  Filled 2017-10-30: qty 0.5

## 2017-10-30 MED ORDER — HYDRALAZINE HCL 20 MG/ML IJ SOLN: 20.0000 mg | INTRAMUSCULAR | 0 refills | Status: DC | PRN

## 2017-10-30 MED ORDER — MIDAZOLAM HCL 2 MG/2ML IJ SOLN: 2.0000 mg | INTRAMUSCULAR | 0 refills | Status: DC | PRN

## 2017-10-30 MED ORDER — ALTEPLASE 2 MG IJ SOLR: 2.0000 mg | Freq: Once | INTRAMUSCULAR | 0 refills | Status: DC | PRN

## 2017-10-30 MED ORDER — HEPARIN SODIUM (PORCINE) 1000 UNIT/ML DIALYSIS: 1000.0000 [IU] | INTRAMUSCULAR | 0 refills | Status: DC | PRN

## 2017-10-30 MED ORDER — FENTANYL CITRATE (PF) 100 MCG/2ML IJ SOLN: 25.0000 ug | INTRAMUSCULAR | 0 refills | Status: DC | PRN

## 2017-10-30 NOTE — Progress Notes (Signed)
2130  Patient packed up on stretcher.  Travel vent applied.  Tube feed stopped and cortrak flushed with 50 ml sterile h20.  Patient transferred off unit in care of care link personell without incident.  vss at time of discharge.  All questions answered

## 2017-10-30 NOTE — Progress Notes (Signed)
[facility] [place]           RN Pt will go to Room 206

## 2017-10-30 NOTE — Progress Notes (Signed)
Arrived to patient room 4M-09 at 1500.  Reviewed treatment plan and this RN agrees with plan.  Report received from bedside RN, Troyce.  Consent verified.  Patient A & O X 4.   Lung sounds diminished and clear to ausculation in all fields. Generalized BUE with BLE 2+ pitting edema. Cardiac:  NSR.  Removed caps and cleansed RIJ catheter with chlorhedxidine.  Aspirated ports of heparin and flushed them with saline per protocol.  Connected and secured lines, initiated treatment at 1530.  UF Goal of 3500 mL and net fluid removal 3L.  Will continue to monitor.

## 2017-10-30 NOTE — Progress Notes (Signed)
Per Dr Joseph ArtWoods request, this RT called Speech Therapy to request the swallow eval be done asap so pt can be cleared to eat. ST also stated pt was on schedule for in-line PM valve trial. It was scheduled with ST and this RT for 1:15 pm.   When speech arrived to 6M, it was stated that the swallow eval could not be done. This RT suggested that they call Dr Joseph ArtWoods and let him know because he had wanted it done today, as the order was placed yesterday and that the PM trial could wait.

## 2017-10-30 NOTE — Progress Notes (Signed)
Occupational Therapy Treatment Patient Details Name: Chris [name]. MRN: [id number] DOB: 05/27/1966 Today's Date: 10/30/2017    History of present illness 52 yo AAM HIV AF COPD MO Admitted w/resp failure, VDRF. + Flu A/PNA; possible PE;  Baseline creatinine 0.79 10/01/17. AKI in setting of above issues. HDX1 3/4 for volume, markedly catabolic/recurrent Chris [name], massively overloaded. CRRT initiated 10/12/17 for massive anasarca /hyperkalemia. R IJ temp cath 3/3 by CCM.    OT comments  Pt participating well in UE exercise in supine. L shoulder demonstrating 3/5 strength, R 2/5. Mild L UE tremor, appears related to weakness. Pt with stable VS on vent support.  Follow Up Recommendations  CIR    Equipment Recommendations       Recommendations for Other Services      Precautions / Restrictions Precautions Precautions: Fall Precaution Comments: on vent Restrictions Weight Bearing Restrictions: No       Mobility Bed Mobility                  Transfers                      Balance                                           ADL either performed or assessed with clinical judgement   ADL                                         General ADL Comments: continues to be dependent, using soft touch call button     Vision       Perception     Praxis      Cognition Arousal/Alertness: Awake/alert Behavior During Therapy: Flat affect(irritable) Overall Cognitive Status: Difficult to assess                                 General Comments: pt irritable, likes to know when procedures on going to happen        Exercises Exercises: General Upper Extremity General Exercises - Upper Extremity Shoulder Flexion: AAROM;Both;10 reps;Supine Shoulder Extension: AROM;AAROM;Both;10 reps;Supine Elbow Flexion: AROM;Both;10 reps;Supine;Strengthening Elbow Extension: AROM;Both;10 reps;Supine;Strengthening Digit Composite Flexion:  AROM;Both;5 reps;Supine Composite Extension: AROM;Both;5 reps;Supine   Shoulder Instructions       General Comments      Pertinent Vitals/ Pain       Pain Assessment: No/denies pain  Home Living                                          Prior Functioning/Environment              Frequency  Min 2X/week        Progress Toward Goals  OT Goals(current goals can now be found in the care plan section)  Progress towards OT goals: Progressing toward goals  Acute Rehab OT Goals Patient Stated Goal: to get better OT Goal Formulation: With patient Time For Goal Achievement: 11/05/17 Potential to Achieve Goals: Good  Plan Discharge plan remains appropriate    Co-evaluation  AM-PAC PT "6 Clicks" Daily Activity     Outcome Measure   Help from another person eating meals?: Total Help from another person taking care of personal grooming?: Total Help from another person toileting, which includes using toliet, bedpan, or urinal?: Total Help from another person bathing (including washing, rinsing, drying)?: Total Help from another person to put on and taking off regular upper body clothing?: Total Help from another person to put on and taking off regular lower body clothing?: Total 6 Click Score: 6    End of Session Equipment Utilized During Treatment: Other (comment)(vent)  OT Visit Diagnosis: Muscle weakness (generalized) (M62.81)   Activity Tolerance Patient tolerated treatment well   Patient Left in bed;with call bell/phone within reach   Nurse Communication          Time: [id number] OT Time Calculation (min): 21 min  Charges: OT General Charges $OT Visit: 1 Visit OT Treatments $Therapeutic Exercise: 8-22 mins  10/30/2017 Chris RoundJulie Nickole [name], OTR/L Pager: [phone]   Chris [name], Chris [name] 10/30/2017, 11:41 AM

## 2017-10-30 NOTE — Progress Notes (Signed)
  Speech Language Pathology Treatment: Hillary BowPassy Muir Speaking valve  Patient Details Name: Chris FavorsJerome A Walters [name] MRN: [id number] DOB: 07/05/1966 Today's Date: 10/30/2017 Time: [id number] SLP Time Calculation (min) (ACUTE ONLY): 18 min  Assessment / Plan / Recommendation Clinical Impression  Pt able to vocalize with Passy-Muir valve placement inline with ventilator with RT present. Pt on PCV mode; RT suctioned, deflated cuff (with fluctuating exhaled volume) prior to SLP donning vavle. RT decreased PEEP to 0 from 5 and pt phonated with a breathy quality, mildly decreased intensity (no need to adjust tidal volume with pressure control mode). He coordinated respiration with phonation without difficulty in short sentences. RR, SpO2 and HR stable. Pt scheduled to leave for [facility] today. Continue PMV with ventilator at discharge.  Swallow assessment ordered. Pt will require objective evaluation with FEES which was unable to be completed prior to pt's discharge.     HPI HPI: 52 yo male presented with dyspnea, cough, fever, hypoxia. Found to have Influenza PNA causing ARDS. ETT 2/21-3/1, trach placed 3/1.Course complicated by hypervolemia and renal failure. PMHx of HIV, A fib, COPD, DM.       SLP Plan  Continue with current plan of care       Recommendations         Patient may use Passy-Muir Speech Valve: with SLP only PMSV Supervision: Full         Oral Care Recommendations: Oral care QID Follow up Recommendations: LTACH SLP Visit Diagnosis: Aphonia (R49.1) Plan: Continue with current plan of care       GO                Royce MacadamiaLitaker, Scotty Weigelt Willis 10/30/2017, 4:30 PM   Breck CoonsLisa Willis Lonell FaceLitaker M.Ed [employer]SLP Pager [phone]

## 2017-10-30 NOTE — Progress Notes (Signed)
Dialysis treatment completed.  3500 mL ultrafiltrated.  3000 mL net fluid removal.  Patient status unchanged. Lung sounds diminished to ausculation in all fields. BLE 1+ pitting edema. Cardiac: NSR.  Cleansed RIJ catheter with chlorhexidine.  Disconnected lines and flushed ports with saline per protocol.  Ports locked with heparin and capped per protocol.    Report given to bedside, RN Troyce.

## 2017-10-30 NOTE — Discharge Summary (Signed)
Physician Discharge Summary  [name] [place] [name]. ZOX:[id number] DOB: [birth year] DOA: 10/01/2017  PCP: System, Pcp Not In  Admit date: 10/01/2017 Discharge date: 10/30/2017  Time spent: 35 minutes  Recommendations for Outpatient Follow-up:   Acute respiratory failure with hypoxia and hypercapnia/ARDS secondary positive influenza Pneumonia -failed weaning trial. Now with trach -SBT daily: -scopolamine patch has reduced secretions significantly.    COPD/Emphysema   -DuoNeb QID -3/21 Decrease Solu-Medrol 40 mg daily.  Taper over the next 2 weeks.   Purulent Tracheobronchitis -Completed course of antibiotics no ongoing evidence of infection   A. Fib with RVR -Currently in NSR -Amiodarone 200 mg daily -Cardizem 60 mg QID -Coumadin 10 mg daily -Requested that [facility] check daily INR Recent Labs  Lab 10/24/17 0509 10/25/17 0338 10/26/17 0849 10/27/17 0411 10/28/17 0156 10/29/17 0435 10/30/17 0407  INR 1.29 1.35 1.40 1.59 2.24 2.95 2.91  -Currently therapeutic   Acute on Chronic diastolic CHF -Strict in and outince admission +13.3 L -Daily weight       Filed Weights    10/28/17 1122 10/29/17 0500 10/30/17 0331  Weight: 208 lb 1.8 oz (94.4 kg) 213 lb 6.5 oz (96.8 kg) 213 lb 13.5 oz (97 kg)  -volume management per HD -transfuse for hemoglobin< 8   Hypotension  Resolved - BP stable    Acute renal failure (baseline Cr 0.79) Recent Labs  Lab 10/24/17 0509 10/25/17 0338 10/27/17 0411 10/28/17 0156 10/28/17 0838 10/29/17 0435 10/30/17 0407  CREATININE 4.04* 5.60* 7.64* 8.14* 5.56* 4.85* 5.44*  -ATN   -intermittent  HD per Nephrology -patient now accepting HD  -3/19 psychiatry consult: Per the progress note patient DOES have capacity to refuse dialysis    Hyperkalemia -Managed by HD    Anemia of Critical illness/Chronic disease  Recent Labs  Lab 10/24/17 0509 10/25/17 0338 10/27/17 0411 10/28/17 0156 10/28/17 0838 10/29/17 0435 10/30/17 0407  HGB 8.4* 8.1*  7.8* 7.7* 7.9* 8.3* 7.9*  -stable   Esophageal Candidiasis -Complete course of treatment   HIV -considering patient treated for esophageal candidiasis, will obtain updated CD4 count good possibility< 200 -HIV RNA quant; <20 -continue Emtriva + Tivicay    DM type 2 uncontrolled with complication -1/23 Hemoglobin A1c=10.5 -Lantus 20 units BID -resistant SSI   Acute metabolic encephalopathy -appears improved but difficult to tell patient on trach. -uremia most likely contributing. Hopefully will improve with HD -Resolved   Uremia Recent Labs  Lab 10/24/17 0509 10/25/17 0338 10/27/17 0411 10/28/17 0156 10/28/17 0838 10/29/17 0435 10/30/17 0407  BUN 58* 81* 131* 143* 99* 85* 117*  -Continue to monitor as patient goes through HD   Anemia  unspecified -Occult blood pending -Anemia panel pending -Complete anemia workup at [facility]  Dysphagia -Patient evaluated by speech at bedside.  Requires FEES study unfortunately cannot be accomplished today.   -Patient will require FEES swallow evaluation at [facility] prior to clearance for diet.     Depression -3/20 Paxil 20 mg daily       Discharge Diagnoses:  Principal Problem:   Evaluation by psychiatric service required Active Problems:   COPD exacerbation (HCC)   Acute on chronic respiratory failure with hypoxia (HCC)   Diabetes mellitus type 2 in obese (HCC)   COPD with exacerbation (HCC)   Atrial fibrillation (HCC)   HIV (human immunodeficiency virus infection) (HCC)   Tobacco abuse   Bronchitis   AKI (acute kidney injury) (HCC)   Endotracheally intubated   Influenza, pneumonia   Atrial fibrillation with RVR (HCC)  Elevated troponin   Goals of care, counseling/discussion   Palliative care by specialist   Palliative care encounter   Discharge Condition: Full  Diet recommendation: Tube feeds  Filed Weights   10/28/17 1122 10/29/17 0500 10/30/17 0331  Weight: 208 lb 1.8 oz (94.4 kg) 213 lb 6.5 oz (96.8 kg)  213 lb 13.5 oz (97 kg)    History of present illness:  52 yo BM PMHx Depression HIV, A fib, COPD, and DM Type 2 without complication   Presented with dyspnea, cough, fever, and hypoxia and was found to have Influenza PNA causing ARDS. His hospital course has been complicated by hypervolemia and renal failure.   During his hospitalization patient was treated for acute respiratory failure with hypoxia and hypercapnia secondary to influenza pneumonia which resulted in ARDS.  Compounded by COPD/emphysema and A. fib with RVR.  Unfortunately patient had failure to wean which resulted in tracheostomy.  Patient continued to have failure to wean.  Most likely exacerbated by his acute renal failure resulting in hemodialysis.  Initially patient refused hemodialysis however since being convinced to go to HD his renal status and respiratory status has improved significantly.  Although patient has not weaned fully from vent has improved significantly.  Stable for discharge.  Patient understands will be a prolonged weaning process.   Procedures: 2/21 Admit 2/22 Intubated 2/27 ID consulted for persistent fever 2/28 Renal consult for AKI, Cardiology consult for A fib with RVR 3/01 Trach 3/03 R IJ HD cath - attempted HD 3/04 Changed to CRRT 3/05 Worsening hypoxia >> vent settings adjusted 3/12 CRRT stopped  3/13 first HD; PSV x 8 hrs      Consultations: Nephrology Radiology   Antibiotics Anti-infectives (From admission, onward)   Start     Stop   10/24/17 2000  emtricitabine (EMTRIVA) capsule 200 mg         10/24/17 1018  ceFAZolin (ANCEF) 2-4 GM/100ML-% IVPB    Note to Pharmacy:  Anthony Sar   : cabinet override   10/24/17 1129   10/21/17 2000  tenofovir (VIREAD) tablet 300 mg         10/16/17 1200  piperacillin-tazobactam (ZOSYN) IVPB 3.375 g     10/20/17 1751   10/16/17 1000  emtricitabine (EMTRIVA) capsule 200 mg  Status:  Discontinued     10/21/17 0638   10/15/17 0900  vancomycin  (VANCOCIN) IVPB 1000 mg/200 mL premix  Status:  Discontinued     10/15/17 1015   10/14/17 1200  piperacillin-tazobactam (ZOSYN) IVPB 3.375 g  Status:  Discontinued     10/16/17 0801   10/14/17 1000  tenofovir (VIREAD) tablet 300 mg  Status:  Discontinued     10/21/17 0638   10/14/17 0930  vancomycin (VANCOCIN) 2,500 mg in sodium chloride 0.9 % 500 mL IVPB     10/14/17 1159   10/14/17 0900  piperacillin-tazobactam (ZOSYN) IVPB 3.375 g  Status:  Discontinued     10/14/17 0905   10/12/17 0900  emtricitabine (EMTRIVA) capsule 200 mg  Status:  Discontinued     10/12/17 1036   10/11/17 0800  darunavir (PREZISTA) tablet 800 mg  Status:  Discontinued     10/10/17 1046   10/11/17 0800  ritonavir (NORVIR) tablet 100 mg  Status:  Discontinued     10/10/17 1046   10/07/17 1900  anidulafungin (ERAXIS) 100 mg in sodium chloride 0.9 % 100 mL IVPB     10/13/17 1954   10/07/17 1045  azithromycin (ZITHROMAX) 500 mg in sodium  chloride 0.9 % 250 mL IVPB  Status:  Discontinued     10/09/17 0916   10/05/17 1000  oseltamivir (TAMIFLU) 6 MG/ML suspension 75 mg     10/06/17 1120   10/03/17 2200  oseltamivir (TAMIFLU) 6 MG/ML suspension 30 mg  Status:  Discontinued     10/05/17 0745   10/03/17 0400  fluconazole (DIFLUCAN) IVPB 100 mg  Status:  Discontinued     10/07/17 1758   10/02/17 1200  dolutegravir (TIVICAY) tablet 50 mg         10/02/17 1200  emtricitabine-tenofovir AF (DESCOVY) 200-25 MG per tablet 1 tablet  Status:  Discontinued     10/10/17 1046   10/02/17 1000  oseltamivir (TAMIFLU) 6 MG/ML suspension 75 mg  Status:  Discontinued     10/03/17 1550   10/02/17 0900  vancomycin (VANCOCIN) IVPB 1000 mg/200 mL premix  Status:  Discontinued     10/02/17 1053   10/02/17 0800  vancomycin (VANCOCIN) IVPB 1000 mg/200 mL premix  Status:  Discontinued     10/01/17 2009   10/02/17 0600  ceFEPIme (MAXIPIME) 2 g in sodium chloride 0.9 % 100 mL IVPB  Status:  Discontinued     10/08/17 0839   10/02/17 0400   fluconazole (DIFLUCAN) IVPB 200 mg     10/02/17 0603   10/01/17 2200  ceFEPIme (MAXIPIME) 1 g in sodium chloride 0.9 % 100 mL IVPB  Status:  Discontinued     10/01/17 1841   10/01/17 2030  vancomycin (VANCOCIN) 2,500 mg in sodium chloride 0.9 % 500 mL IVPB     10/01/17 2355   10/01/17 2015  ceFEPIme (MAXIPIME) 2 g in sodium chloride 0.9 % 100 mL IVPB  Status:  Discontinued     10/01/17 2006   10/01/17 2015  vancomycin (VANCOCIN) IVPB 1000 mg/200 mL premix  Status:  Discontinued     10/01/17 2006   10/01/17 2000  ceFEPIme (MAXIPIME) 1 g in sodium chloride 0.9 % 100 mL IVPB  Status:  Discontinued     10/01/17 2010   10/01/17 1900  vancomycin (VANCOCIN) 2,500 mg in sodium chloride 0.9 % 500 mL IVPB  Status:  Discontinued     10/01/17 2009   10/01/17 1830  oseltamivir (TAMIFLU) capsule 75 mg  Status:  Discontinued     10/02/17 0738   10/01/17 1815  bictegravir-emtricitabine-tenofovir AF (BIKTARVY) 50-200-25 MG per tablet 1 tablet  Status:  Discontinued     10/02/17 1059   10/01/17 1730  levofloxacin (LEVAQUIN) tablet 500 mg  Status:  Discontinued     10/01/17 1809       Discharge Exam: Vitals:   10/30/17 1144 10/30/17 1200 10/30/17 1300 10/30/17 1400  BP:  (!) 142/73 (!) 146/71 (!) 159/81  Pulse:  84 81 88  Resp:  20 20 (!) 25  Temp: 98.8 F (37.1 C)     TempSrc: Oral     SpO2:  92% 94% 94%  Weight:      Height:        General: alert, follows commands, positive acute respiratory distress, tolerating HD Neck:  Negative scars, masses, torticollis, lymphadenopathy, JVD,#6 cuffed trach in place, covered and clean negative sign of infection Lungs: Clear to auscultation bilaterally without wheezes or crackles    Discharge Instructions   Allergies as of 10/30/2017      Reactions   Bactrim [sulfamethoxazole-trimethoprim] Hives      Medication List    STOP taking these medications   acetaminophen-codeine 300-30 MG tablet  Commonly known as:  TYLENOL #3   albuterol 108 (90  Base) MCG/ACT inhaler Commonly known as:  PROVENTIL HFA;VENTOLIN HFA   apixaban 5 MG Tabs tablet Commonly known as:  ELIQUIS   atorvastatin 20 MG tablet Commonly known as:  LIPITOR   bictegravir-emtricitabine-tenofovir AF 50-200-25 MG Tabs tablet Commonly known as:  BIKTARVY   budesonide-formoterol 80-4.5 MCG/ACT inhaler Commonly known as:  SYMBICORT   clotrimazole 1 % cream Commonly known as:  LOTRIMIN   clotrimazole 10 MG troche Commonly known as:  MYCELEX   clotrimazole-betamethasone cream Commonly known as:  LOTRISONE   diltiazem 360 MG 24 hr capsule Commonly known as:  CARDIZEM CD   furosemide 20 MG tablet Commonly known as:  LASIX   metFORMIN 1000 MG tablet Commonly known as:  GLUCOPHAGE   OXYGEN   sildenafil 50 MG tablet Commonly known as:  VIAGRA   SPIRIVA RESPIMAT 2.5 MCG/ACT Aers Generic drug:  Tiotropium Bromide Monohydrate   terbinafine 250 MG tablet Commonly known as:  LAMISIL   urea 20 % cream Commonly known as:  CARMOL     TAKE these medications   acetaminophen 160 MG/5ML solution Commonly known as:  TYLENOL Place 20.3 mLs (650 mg total) into feeding tube every 6 (six) hours as needed for mild pain, headache or fever.   alteplase 2 MG injection Commonly known as:  CATHFLO ACTIVASE 2 mg by Intracatheter route once as needed for open catheter.   amiodarone 200 MG tablet Commonly known as:  PACERONE Place 1 tablet (200 mg total) into feeding tube daily. Start taking on:  10/31/2017   bisacodyl 10 MG suppository Commonly known as:  DULCOLAX Place 1 suppository (10 mg total) rectally daily as needed for moderate constipation.   clonazePAM 0.5 MG tablet Commonly known as:  KLONOPIN Place 1 tablet (0.5 mg total) into feeding tube at bedtime.   clonazePAM 0.25 MG disintegrating tablet Commonly known as:  KLONOPIN Place 1 tablet (0.25 mg total) into feeding tube daily. Start taking on:  10/31/2017   Darbepoetin Alfa 100 MCG/0.5ML Sosy  injection Commonly known as:  ARANESP Inject 0.5 mLs (100 mcg total) into the skin once for 1 dose.   diltiazem 10 mg/ml oral suspension Commonly known as:  CARDIZEM Place 6 mLs (60 mg total) into feeding tube every 6 (six) hours.   docusate 50 MG/5ML liquid Commonly known as:  COLACE Place 10 mLs (100 mg total) into feeding tube 2 (two) times daily.   dolutegravir 50 MG tablet Commonly known as:  TIVICAY Take 1 tablet (50 mg total) by mouth daily.   emtricitabine 200 MG capsule Commonly known as:  EMTRIVA 1 capsule every 4 days   famotidine 40 MG/5ML suspension Commonly known as:  PEPCID Place 2.5 mLs (20 mg total) into feeding tube at bedtime.   feeding supplement (VITAL HIGH PROTEIN) Liqd liquid Place 1,000 mLs into feeding tube daily.   fentaNYL 100 MCG/2ML injection Commonly known as:  SUBLIMAZE Inject 0.5 mLs (25 mcg total) into the vein every 2 (two) hours as needed (breakthrough pain).   heparin 1000 unit/mL Soln injection 1 mL (1,000 Units total) by Dialysis route as needed (in dialysis).   hydrALAZINE 20 MG/ML injection Commonly known as:  APRESOLINE Inject 1 mL (20 mg total) into the vein every 4 (four) hours as needed (SBP > 160 or DBP > 100.).   insulin aspart 100 UNIT/ML injection Commonly known as:  novoLOG Inject 0-20 Units into the skin every 4 (four) hours.  insulin glargine 100 UNIT/ML injection Commonly known as:  LANTUS Inject 0.2 mLs (20 Units total) into the skin 2 (two) times daily.   ipratropium-albuterol 0.5-2.5 (3) MG/3ML Soln Commonly known as:  DUONEB Take 3 mLs by nebulization 3 (three) times daily. What changed:    when to take this  reasons to take this   lidocaine (PF) 1 % Soln injection Commonly known as:  XYLOCAINE Inject 5 mLs into the skin as needed (topical anesthesia for hemodialysis ifGEBAUERS is ineffective.).   lidocaine-prilocaine cream Commonly known as:  EMLA Apply 1 application topically as needed (topical  anesthesia for hemodialysis if Gebauers and Lidocaine injection are ineffective.).   methylPREDNISolone sodium succinate 40 mg/mL injection Commonly known as:  SOLU-MEDROL Inject 1 mL (40 mg total) into the vein daily.   metoprolol tartrate 5 MG/5ML Soln injection Commonly known as:  LOPRESSOR Inject 2.5-5 mLs (2.5-5 mg total) into the vein every 3 (three) hours as needed (HR>115).   midazolam 2 MG/2ML Soln injection Commonly known as:  VERSED Inject 2 mLs (2 mg total) into the vein every 2 (two) hours as needed for agitation (to maintain RASS goal.).   ondansetron 4 MG/2ML Soln injection Commonly known as:  ZOFRAN Inject 2 mLs (4 mg total) into the vein every 6 (six) hours as needed for nausea.   oxyCODONE 5 MG/5ML solution Commonly known as:  ROXICODONE Take 5-10 mLs (5-10 mg total) by mouth every 3 (three) hours as needed for severe pain.   pantoprazole 40 MG tablet Commonly known as:  PROTONIX Take 1 tablet (40 mg total) by mouth 2 (two) times daily before a meal. What changed:    when to take this  reasons to take this   PARoxetine 20 MG tablet Commonly known as:  PAXIL Take 1 tablet (20 mg total) by mouth at bedtime.   pentafluoroprop-tetrafluoroeth Aero Commonly known as:  GEBAUERS Apply 1 application topically as needed (topical anesthesia for hemodialysis).   polyethylene glycol packet Commonly known as:  MIRALAX / GLYCOLAX Take 17 g by mouth daily. Start taking on:  10/31/2017   scopolamine 1 MG/3DAYS Commonly known as:  TRANSDERM-SCOP Place 1 patch (1.5 mg total) onto the skin every 3 (three) days. Start taking on:  10/31/2017   sennosides 8.8 MG/5ML syrup Commonly known as:  SENOKOT Place 5 mLs into feeding tube at bedtime.   sevelamer carbonate 2.4 g Pack Commonly known as:  RENVELA Place 2.4 g into feeding tube 3 (three) times daily with meals.   tenofovir 300 MG tablet Commonly known as:  VIREAD Take 1 tablet (300 mg total) by mouth every  Wednesday. Start taking on:  11/04/2017   Testosterone 12.5 MG/ACT (1%) Gel Place 1 Pump onto the skin daily.   traMADol 50 MG tablet Commonly known as:  ULTRAM Place 1 tablet (50 mg total) into feeding tube every 12 (twelve) hours as needed for moderate pain.   warfarin 10 MG tablet Commonly known as:  COUMADIN Take 1 tablet (10 mg total) by mouth one time only at 6 PM.      Allergies  Allergen Reactions  . Bactrim [Sulfamethoxazole-Trimethoprim] Hives      The results of significant diagnostics from this hospitalization (including imaging, microbiology, ancillary and laboratory) are listed below for reference.    Significant Diagnostic Studies: Dg Chest 2 View  Result Date: 10/01/2017 CLINICAL DATA:  Cough and shortness of breath.  Chest pain EXAM: CHEST  2 VIEW COMPARISON:  Chest CT July 23, 2017; chest  radiograph July 23, 2017 FINDINGS: There is bibasilar atelectatic change. There is no frank edema or consolidation. Heart is mildly enlarged with pulmonary vascularity within normal limits. No adenopathy. No bone lesions. No pneumothorax. IMPRESSION: Mild cardiomegaly. Bibasilar atelectasis. No frank edema or consolidation. Electronically Signed   By: Bretta Bang III M.D.   On: 10/01/2017 16:13   US Renal  Result Date: 10/08/2017 CLINICAL DATA:  Acute renal insufficiency EXAM: RENAL ULTRASOUND COMPARISON:  Abdominal ultrasound August 15, 2016 FINDINGS: Right Kidney: Length: 13.9 cm. Echogenicity and renal cortical thickness are within normal limits. No mass or perinephric fluid. There is slight fullness of the right renal collecting system. No sonographically demonstrable calculus or ureterectasis. Left Kidney: Length: 13.8 cm. Echogenicity and renal cortical thickness are within normal limits. No mass or perinephric fluid. There is slight fullness of the left renal collecting system. No sonographically demonstrable calculus or ureterectasis. There is slight Bladder:  Urinary bladder is decompressed with a Foley catheter. IMPRESSION: Slight fullness of each renal collecting system. No obstructing focus evident on either side. Kidneys otherwise appear unremarkable bilaterally. Urinary bladder decompressed with Foley catheter and cannot be assessed. Electronically Signed   By: Bretta Bang III M.D.   On: 10/08/2017 15:11   Ir Fluoro Guide Cv Line Right  Result Date: 10/24/2017 INDICATION: Acute kidney injury EXAM: ULTRASOUND GUIDANCE FOR VASCULAR ACCESS RIGHT INTERNAL JUGULAR PERMANENT HEMODIALYSIS CATHETER Date:  10/24/2017 10/24/2017 10:57 am Radiologist:  Judie Petit. [name], MD Guidance:  Ultrasound and fluoroscopic FLUOROSCOPY TIME:  Fluoroscopy Time:  48 seconds (7 mGy). MEDICATIONS: Ancef 2 g administered within 1 hour of the procedure ANESTHESIA/SEDATION: Versed 1.0 mg IV; Fentanyl  50 mcg IV; Moderate Sedation Time:  15 minutes The patient was continuously monitored during the procedure by the interventional radiology nurse under my direct supervision. CONTRAST:  None. COMPLICATIONS: None immediate. PROCEDURE: Informed consent was obtained from the patient following explanation of the procedure, risks, benefits and alternatives. The patient understands, agrees and consents for the procedure. All questions were addressed. A time out was performed. Maximal barrier sterile technique utilized including caps, mask, sterile gowns, sterile gloves, large sterile drape, hand hygiene, and 2% chlorhexidine scrub. Under sterile conditions and local anesthesia, right internal jugular micropuncture venous access was performed with ultrasound. Images were obtained for documentation. A guide wire was inserted followed by a transitional dilator. Next, a 0.035 guidewire was advanced into the IVC with a 5-French catheter. Measurements were obtained from the right venotomy site to the proximal right atrium. In the right infraclavicular chest, a subcutaneous tunnel was created under sterile  conditions and local anesthesia. 1% lidocaine with epinephrine was utilized for this. The 27 cm tip to cuff dialysis catheter was tunneled subcutaneously to the venotomy site and inserted into the SVC/RA junction through a valved peel-away sheath. Position was confirmed with fluoroscopy. Images were obtained for documentation. Blood was aspirated from the catheter followed by saline and heparin flushes. The appropriate volume and strength of heparin was instilled in each lumen. Caps were applied. The catheter was secured at the tunnel site with Gelfoam and a pursestring suture. The venotomy site was closed with subcuticular Vicryl suture. Dermabond was applied to the small right neck incision. A dry sterile dressing was applied. The catheter is ready for use. No immediate complications. IMPRESSION: Ultrasound and fluoroscopically guided right internal jugular tunneled hemodialysis catheter (27 cm tip to cuff dialysis catheter). Electronically Signed   By: Judie Petit.  Shick M.D.   On: 10/24/2017 11:02   Ir  US Guide Vasc Access Right  Result Date: 10/24/2017 INDICATION: Acute kidney injury EXAM: ULTRASOUND GUIDANCE FOR VASCULAR ACCESS RIGHT INTERNAL JUGULAR PERMANENT HEMODIALYSIS CATHETER Date:  10/24/2017 10/24/2017 10:57 am Radiologist:  Judie Petit. [name], MD Guidance:  Ultrasound and fluoroscopic FLUOROSCOPY TIME:  Fluoroscopy Time:  48 seconds (7 mGy). MEDICATIONS: Ancef 2 g administered within 1 hour of the procedure ANESTHESIA/SEDATION: Versed 1.0 mg IV; Fentanyl  50 mcg IV; Moderate Sedation Time:  15 minutes The patient was continuously monitored during the procedure by the interventional radiology nurse under my direct supervision. CONTRAST:  None. COMPLICATIONS: None immediate. PROCEDURE: Informed consent was obtained from the patient following explanation of the procedure, risks, benefits and alternatives. The patient understands, agrees and consents for the procedure. All questions were addressed. A time out was  performed. Maximal barrier sterile technique utilized including caps, mask, sterile gowns, sterile gloves, large sterile drape, hand hygiene, and 2% chlorhexidine scrub. Under sterile conditions and local anesthesia, right internal jugular micropuncture venous access was performed with ultrasound. Images were obtained for documentation. A guide wire was inserted followed by a transitional dilator. Next, a 0.035 guidewire was advanced into the IVC with a 5-French catheter. Measurements were obtained from the right venotomy site to the proximal right atrium. In the right infraclavicular chest, a subcutaneous tunnel was created under sterile conditions and local anesthesia. 1% lidocaine with epinephrine was utilized for this. The 27 cm tip to cuff dialysis catheter was tunneled subcutaneously to the venotomy site and inserted into the SVC/RA junction through a valved peel-away sheath. Position was confirmed with fluoroscopy. Images were obtained for documentation. Blood was aspirated from the catheter followed by saline and heparin flushes. The appropriate volume and strength of heparin was instilled in each lumen. Caps were applied. The catheter was secured at the tunnel site with Gelfoam and a pursestring suture. The venotomy site was closed with subcuticular Vicryl suture. Dermabond was applied to the small right neck incision. A dry sterile dressing was applied. The catheter is ready for use. No immediate complications. IMPRESSION: Ultrasound and fluoroscopically guided right internal jugular tunneled hemodialysis catheter (27 cm tip to cuff dialysis catheter). Electronically Signed   By: Judie Petit.  Shick M.D.   On: 10/24/2017 11:02   Dg Chest Portable 1 View  Result Date: 10/20/2017 CLINICAL DATA:  Respiratory failure, shortness of breath EXAM: PORTABLE CHEST 1 VIEW COMPARISON:  Chest x-ray of 10/19/2017 FINDINGS: Tracheostomy remains in place and central venous lines are unchanged. Cardiomegaly mild haziness of the  perihilar vasculature is again noted which may indicate mild pulmonary vascular congestion. Mild volume loss is noted at the lung bases. Feeding tube is present. IMPRESSION: 1. No change in tracheostomy and central venous lines. 2. There is cardiomegaly present with mild pulmonary vascular congestion. No definite pneumonia or pleural effusion is evident. Electronically Signed   By: Dwyane Dee M.D.   On: 10/20/2017 08:57   Dg Chest Port 1 View  Result Date: 10/19/2017 CLINICAL DATA:  Respiratory failure EXAM: PORTABLE CHEST 1 VIEW COMPARISON:  October 17, 2017 FINDINGS: Tracheostomy catheter tip is 7.2 cm above the carina. Feeding tube tip is below the diaphragm. Right and left jugular catheter tips are both in the superior vena cava. No pneumothorax. There is atelectatic change in the left mid lung. Lungs elsewhere clear. Heart is enlarged with pulmonary vascularity within normal limits, stable. No adenopathy. No appreciable bone lesions. IMPRESSION: Tube and catheter positions as described without pneumothorax. Stable cardiac prominence. Left midlung atelectasis. No edema or  consolidation. Electronically Signed   By: Bretta Bang III M.D.   On: 10/19/2017 07:29   Dg Chest Port 1 View  Result Date: 10/17/2017 CLINICAL DATA:  Respiratory failure. EXAM: PORTABLE CHEST 1 VIEW COMPARISON:  10/16/2017 FINDINGS: Tracheostomy tube overlies the airway. Bilateral jugular catheters terminate over the upper SVC. Feeding tube courses into the left upper abdomen with tip not imaged. The cardiac silhouette remains enlarged. Mild diffuse prominence of the interstitial markings is unchanged and may be at least in part chronic and secondary to known emphysema. Mild patchy basilar opacities are minimally increased on the right and unchanged on the left. No overt edema, sizable pleural effusion, or pneumothorax is identified. IMPRESSION: Mild bibasilar opacities, minimally increased on the right and which may reflect  atelectasis. Electronically Signed   By: Sebastian Ache M.D.   On: 10/17/2017 07:41   Dg Chest Portable 1 View  Result Date: 10/16/2017 CLINICAL DATA:  Respiratory failure EXAM: PORTABLE CHEST 1 VIEW COMPARISON:  10/14/2017 FINDINGS: Tracheostomy in good position. Bilateral central venous catheter tips in the SVC unchanged. Feeding tube in place with the tip not visualized. Cardiac enlargement with mild vascular congestion which has improved in the interval. Improvement in bibasilar airspace disease. No pleural effusion IMPRESSION: Improvement in bilateral airspace disease. Support lines remain in good position. Electronically Signed   By: Marlan Palau M.D.   On: 10/16/2017 06:48   Dg Chest Port 1 View  Result Date: 10/14/2017 CLINICAL DATA:  Respiratory failure, endotracheal tube. EXAM: PORTABLE CHEST 1 VIEW COMPARISON:  10/13/2017 and CT chest 07/23/2017. FINDINGS: Tracheostomy is midline. Right IJ central line tip projects at the junction of the brachiocephalic veins. Left IJ central line tip is likely in the upper SVC. Feeding tube is followed into the stomach with the tip projecting beyond the inferior boundary of the image. Heart size stable. There is a fine pattern of interstitial prominence bilaterally, likely due to emphysema when compared with 07/23/2017. Bibasilar aeration has improved from yesterday, however. There may be trace right pleural fluid. IMPRESSION: 1. Improving bibasilar aeration with residual airspace opacification, possibly due to atelectasis. Pneumonia is not excluded. 2. Probable trace right pleural effusion. Electronically Signed   By: Leanna Battles M.D.   On: 10/14/2017 05:49   Dg Chest Port 1 View  Result Date: 10/13/2017 CLINICAL DATA:  Oxygen desaturation. EXAM: PORTABLE CHEST 1 VIEW COMPARISON:  Radiograph yesterday at 0359 hour FINDINGS: Tracheostomy tube at the thoracic inlet. Enteric tube in place, tip below the diaphragm not included in the field of view. Right  internal jugular sheath with tip projecting over the proximal SVC, unchanged. Left internal jugular central venous catheter with tip projecting over the proximal SVC, unchanged. Worsening right lung base aeration with increasing patchy and hazy opacity. Cardiomegaly and vascular congestion are unchanged. Left lung base atelectasis is unchanged. No pneumothorax. IMPRESSION: 1. Worsening patchy and hazy opacity at the right lung base may represent developing pleural effusion and atelectasis or airspace disease. 2. Cardiomegaly and vascular congestion are again seen. 3. Support apparatus are unchanged. Electronically Signed   By: Rubye Oaks M.D.   On: 10/13/2017 05:24   Dg Chest Port 1 View  Result Date: 10/12/2017 CLINICAL DATA:  Pneumonia. EXAM: PORTABLE CHEST 1 VIEW COMPARISON:  10/11/2017 FINDINGS: Tracheostomy tube overlies the airway. Bilateral jugular catheters terminate over the upper SVC, unchanged. Enteric tube courses into the upper abdomen with tip not imaged. The cardiac silhouette remains enlarged. Pulmonary vascular congestion is unchanged. Patchy bibasilar opacities  are unchanged. No sizable pleural effusion or pneumothorax is identified. IMPRESSION: Unchanged appearance of the chest. Cardiomegaly, pulmonary vascular congestion, and mild bibasilar atelectasis. Electronically Signed   By: Sebastian Ache M.D.   On: 10/12/2017 06:41   Dg Chest Port 1 View  Result Date: 10/11/2017 CLINICAL DATA:  Dialysis catheter insertion EXAM: PORTABLE CHEST 1 VIEW COMPARISON:  10/09/2017 FINDINGS: Tracheostomy in satisfactory position. Left IJ venous catheter terminates in the mid SVC. Right IJ dialysis catheter terminates in the mid SVC. Enteric tube courses below the diaphragm. Cardiomegaly. Pulmonary vascular congestion, without frank interstitial edema. Mild bibasilar atelectasis. No pleural effusion or pneumothorax. IMPRESSION: Right IJ dialysis catheter terminates in the mid SVC. Additional support  apparatus as above. Cardiomegaly with pulmonary vascular congestion and mild bibasilar atelectasis. Electronically Signed   By: Charline Bills M.D.   On: 10/11/2017 12:55   Dg Chest Port 1 View  Result Date: 10/09/2017 CLINICAL DATA:  Tracheostomy placement EXAM: PORTABLE CHEST 1 VIEW COMPARISON:  Study obtained earlier in the day FINDINGS: There is now a tracheostomy present with the tracheostomy tip 7.8 cm above the carina. Left jugular catheter tip is in the left innominate vein near the junction with the superior vena cava. No pneumothorax. A portion of the left base is not seen. There is atelectatic change in the right base. Visualized lungs elsewhere clear. There is cardiomegaly with mild pulmonary venous hypertension. IMPRESSION: 1. Tube and catheter positions as described without pneumothorax evident. 2. A small portion of the left base is not visualized. There is atelectatic change in the right base. Visualized lungs elsewhere clear. 3.  There is a degree of pulmonary vascular congestion. Electronically Signed   By: Bretta Bang III M.D.   On: 10/09/2017 12:58   Dg Chest Port 1 View  Result Date: 10/09/2017 CLINICAL DATA:  Intubation. EXAM: PORTABLE CHEST 1 VIEW COMPARISON:  10/08/2017. FINDINGS: Endotracheal tube, NG tube, left IJ line stable position. Cardiomegaly with pulmonary vascular congestion. Mild bilateral interstitial prominence. Findings suggest mild CHF. No prominent pleural effusion or pneumothorax. Right costophrenic angle not imaged. IMPRESSION: 1.  Lines and tubes in stable position. 2. Cardiomegaly with pulmonary venous congestion and mild bilateral interstitial prominence noted on today's exam. Findings suggest mild CHF. Electronically Signed   By: Maisie Fus  Register   On: 10/09/2017 06:45   Dg Chest Port 1 View  Result Date: 10/08/2017 CLINICAL DATA:  Intubation. EXAM: PORTABLE CHEST 1 VIEW COMPARISON:  10/07/2017. FINDINGS: Endotracheal tube and NG tube in stable  position. Stable cardiomegaly. Interim improvement of pulmonary venous congestion. Persistent basilar atelectasis. No pleural effusion or pneumothorax. IMPRESSION: 1.  Lines and tubes in stable position. 2. Stable cardiomegaly. Interim improvement of pulmonary venous congestion. 3.  Persistent bibasilar atelectasis. Electronically Signed   By: Maisie Fus  Register   On: 10/08/2017 06:51   Dg Chest Port 1 View  Result Date: 10/07/2017 CLINICAL DATA:  Hypoxia EXAM: PORTABLE CHEST 1 VIEW COMPARISON:  Study obtained earlier in the day FINDINGS: Endotracheal tube tip is 7.2 cm above the carina. There is no edema jugular catheter with the tip in the superior vena cava just beyond the junction with the left innominate vein. Nasogastric tube tip and side port are below the diaphragm. No pneumothorax. There is mild bibasilar atelectasis. Lungs elsewhere clear. Heart is mildly enlarged with pulmonary venous hypertension. No adenopathy. No bone lesions. IMPRESSION: Tube and catheter positions as described without pneumothorax. Pulmonary vascular congestion without edema or consolidation. There is bibasilar atelectasis. Electronically Signed  By: Bretta Bang III M.D.   On: 10/07/2017 17:52   Dg Chest Port 1 View  Result Date: 10/07/2017 CLINICAL DATA:  Hx of ETT, sob EXAM: PORTABLE CHEST 1 VIEW COMPARISON:  10/06/2017 FINDINGS: Endotracheal tube is in place, tip approximately 6.5 centimeters above the carina. Nasogastric tube is in place with tip beyond the gastroesophageal junction off the image. Mild bibasilar atelectasis. No focal consolidations or pleural effusions. IMPRESSION: Mild bibasilar atelectasis. Electronically Signed   By: Norva Pavlov M.D.   On: 10/07/2017 09:40   Dg Chest Port 1 View  Result Date: 10/06/2017 CLINICAL DATA:  Hypoxia EXAM: PORTABLE CHEST 1 VIEW COMPARISON:  October 05, 2017 FINDINGS: Endotracheal tube tip is 6.2 cm above the carina. Nasogastric tube tip and side port are below  the diaphragm. No pneumothorax. There is a small right pleural effusion. There is bibasilar atelectasis. There is consolidation in the medial left base. Lungs elsewhere are clear. Heart is borderline enlarged with pulmonary vascularity within normal limits. No adenopathy. There is degenerative change in each shoulder. IMPRESSION: Tube positions as described without pneumothorax. Small right pleural effusion with bibasilar atelectasis. There is a degree of consolidation medial left base suspicious for pneumonia. No new opacity evident. Stable cardiac silhouette. Electronically Signed   By: Bretta Bang III M.D.   On: 10/06/2017 07:48   Dg Chest Port 1 View  Result Date: 10/05/2017 CLINICAL DATA:  Hypoxia EXAM: PORTABLE CHEST 1 VIEW COMPARISON:  October 04, 2017 FINDINGS: Endotracheal tube tip is 5.5 cm above the carina. Nasogastric tube tip and side port are below the diaphragm. No pneumothorax. There is airspace consolidation in the left lower lobe. There is a small right pleural effusion with right base atelectasis. Lungs elsewhere clear. Heart is mildly enlarged with pulmonary vascularity within normal limits. No adenopathy. No bone lesions. IMPRESSION: Tube positions as described without pneumothorax. Consolidation consistent with a degree of pneumonia left base. Small right pleural effusion with right base atelectasis. Stable cardiac prominence. Electronically Signed   By: Bretta Bang III M.D.   On: 10/05/2017 08:02   Dg Chest Port 1 View  Result Date: 10/04/2017 CLINICAL DATA:  Pneumonia.  Ventilator support. EXAM: PORTABLE CHEST 1 VIEW COMPARISON:  10/03/2017 FINDINGS: Endotracheal tube tip is 5 cm above the carina. Nasogastric tube enters the stomach. Patchy infiltrate at the lung bases right more than left persists. No worsening or new finding. IMPRESSION: Tubes well positioned. Persistent patchy basilar infiltrate right more than left. No new finding. Electronically Signed   By: Paulina Fusi M.D.   On: 10/04/2017 07:27   Dg Chest Port 1 View  Result Date: 10/03/2017 CLINICAL DATA:  Dyspnea. EXAM: PORTABLE CHEST 1 VIEW COMPARISON:  Chest x-ray from yesterday. FINDINGS: Unchanged endotracheal and enteric tubes. Stable cardiomegaly. Slightly worsened aeration at the right lung base, likely atelectasis. No focal consolidation, pleural effusion, or pneumothorax. No acute osseous abnormality. IMPRESSION: Increased atelectasis at the right lung base. Electronically Signed   By: Obie Dredge M.D.   On: 10/03/2017 08:18   Dg Chest Port 1 View  Result Date: 10/02/2017 CLINICAL DATA:  52 year old male status post intubation. EXAM: PORTABLE CHEST 1 VIEW COMPARISON:  Chest radiograph dated 10/01/2017 FINDINGS: Endotracheal tube with tip approximately 4.5 cm above the carina. The enteric tube extends into the left upper abdomen. There is a focal area of kink or looping of the tube. The side port and tip of the tube all located in the proximal to mid stomach. Minimal bibasilar  atelectatic changes. No focal consolidation, pleural effusion, or pneumothorax. Mild cardiomegaly. No acute osseous pathology. IMPRESSION: 1. Endotracheal tube above the carina. 2. Enteric tube extends into the left upper abdomen with tip and side-port likely in the proximal to mid stomach. 3. Cardiomegaly with bibasilar atelectatic changes. Electronically Signed   By: Elgie Collard M.D.   On: 10/02/2017 03:11   Dg Abd Portable 1v  Result Date: 10/27/2017 CLINICAL DATA:  Check feeding catheter placement EXAM: PORTABLE ABDOMEN - 1 VIEW COMPARISON:  None. FINDINGS: Nasogastric catheter is noted extending into the stomach. The proximal side port is noted at the level of the GE junction. This should be advanced several cm. IMPRESSION: Nasogastric catheter as described. This should be advanced several cm. Electronically Signed   By: Alcide Clever M.D.   On: 10/27/2017 12:18   Dg Abd Portable 1v  Result Date:  10/14/2017 CLINICAL DATA:  NG tube placement EXAM: PORTABLE ABDOMEN - 1 VIEW COMPARISON:  10/06/2017 FINDINGS: Feeding tube with tip in the pyloric region of the stomach/first portion duodenum. No dilated large or small bowel. IMPRESSION: Feeding tube with tip at the first portion duodenum. Electronically Signed   By: Genevive Bi M.D.   On: 10/14/2017 09:24   Dg Abd Portable 1v  Result Date: 10/06/2017 CLINICAL DATA:  Abdominal distension EXAM: PORTABLE ABDOMEN - 1 VIEW COMPARISON:  None. FINDINGS: Nasogastric tube tip and side port are in the stomach. There is a relative paucity of gas. There is no bowel dilatation or air-fluid level to indicate obstruction. No free air. There is postoperative change in the proximal right femur. IMPRESSION: Nasogastric tube tip and side port in stomach. There is a paucity of bowel gas. This finding may be seen normally but also may be indicative of ileus or enteritis. Bowel obstruction felt to be less likely. No evident free air. Electronically Signed   By: Bretta Bang III M.D.   On: 10/06/2017 09:04    Microbiology: No results found for this or any previous visit (from the past 240 hour(s)).   Labs: Basic Metabolic Panel: Recent Labs  Lab 10/27/17 0411 10/28/17 0156 10/28/17 0838 10/29/17 0435 10/30/17 0407  NA 125* 125* 130* 131* 132*  K 6.8* 6.5* 4.5 4.3 4.5  CL 84* 84* 90* 89* 89*  CO2 21* 21* 26 27 25   GLUCOSE 201* 154* 174* 236* 222*  BUN 131* 143* 99* 85* 117*  CREATININE 7.64* 8.14* 5.56* 4.85* 5.44*  CALCIUM 8.9 8.8* 8.7* 8.8* 8.8*  MG 3.2* 3.2*  --  2.6* 2.7*  PHOS 10.0* 10.9* 7.4* 7.3* 9.0*   Liver Function Tests: Recent Labs  Lab 10/25/17 0338 10/27/17 0411 10/28/17 0838 10/29/17 0435 10/30/17 0407  ALBUMIN 2.1* 2.3* 2.4* 2.3* 2.2*   No results for input(s): LIPASE, AMYLASE in the last 168 hours. No results for input(s): AMMONIA in the last 168 hours. CBC: Recent Labs  Lab 10/27/17 0411 10/28/17 0156 10/28/17 0838  10/29/17 0435 10/30/17 0407  WBC 9.1 7.8 8.3 6.3 7.3  HGB 7.8* 7.7* 7.9* 8.3* 7.9*  HCT 24.2* 23.7* 25.0* 26.2* 25.2*  MCV 92.4 92.9 92.9 95.6 94.4  PLT 328 327 344 295 293   Cardiac Enzymes: No results for input(s): CKTOTAL, CKMB, CKMBINDEX, TROPONINI in the last 168 hours. BNP: BNP (last 3 results) Recent Labs    07/21/17 0910 10/04/17 0226  BNP 30.1 46.8    ProBNP (last 3 results) No results for input(s): PROBNP in the last 8760 hours.  CBG: Recent Labs  Lab 10/29/17 2019 10/29/17 2342 10/30/17 0350 10/30/17 0748 10/30/17 1145  GLUCAP 122* 145* 213* 196* 156*       Signed:  Carolyne Littles, MD [facility] [phone] pager

## 2017-10-30 NOTE — Care Management Note (Addendum)
Case Management Note  Patient Details  Name: Chris RiversJerome A Jocelyn Jr. MRN: [id number] Date of Birth: 06/01/1966  Subjective/Objective:  Pt admitted with SOB secondary to flu                Action/Plan:  PTA independent from home.     Expected Discharge Date:                  Expected Discharge Plan:  Skilled Nursing Facility  In-House Referral:  Clinical Social Work  Discharge planning Services  CM Consult  Post Acute Care Choice:    Choice offered to:     DME Arranged:    DME Agency:     HH Arranged:    HH Agency:     Status of Service:     If discussed at [employer] Meetings, dates discussed:    Additional Comments: 10/30/2017  Discharge order not yet written.  CM has instructed bedside nurse on how to compile transport packet - bedside nurse will also call [facility] when appropriate.  Wife and pt informed of discharge to [facility] today and remain in agreeement  [facility] confirmed pt has bed available today - information for report/[place] given provided on treatment team sticky tab. Pt/wife informed that pt will discharge to [facility] today post HD.    10/28/17 [facility] has approval however no available beds.  Pt remains on vent and still requiring HD  10/22/17 LTACH referral given during LOS 10/22/17, attending in agreement with referral at this time.  Both facilities given referral, [facility] declined to offer bed due to lack of HD bed space, [facility] offered bed pending insurance approval.  CM discussed LTACH discharge with both pt and wife, CM also informed both that bed has been offered by [facility].  Pt/wife in agreement to allow [facility] to initiate insurance auth post discussion with [facility] liaison.  CSW also following as back up plan.    10/16/17 Pt remains on ventilator via trach and CRRT.  Palliative Care consulted.  CM will continue to follow for discharge needs  10/09/17 Pt was unable to wean off ventilator - pt received trach 10/09/17.  CSW informed of  pending SNF placement - [facility]. Cherylann ParrClaxton, Jessup Ogas S, RN 10/30/2017, 11:34 AM

## 2017-10-30 NOTE — Progress Notes (Signed)
ANTICOAGULATION CONSULT NOTE - Follow Up Consult  Pharmacy Consult:  Heparin/warfarin Indication: pulmonary embolus/Afib  Allergies  Allergen Reactions  . Bactrim [Sulfamethoxazole-Trimethoprim] Hives    Patient Measurements: Height: 5\' 9"  (175.3 cm) Weight: 213 lb 13.5 oz (97 kg) IBW/kg (Calculated) : 70.7 Heparin dosing weight: 97 kg  Vital Signs: Temp: 98.4 F (36.9 C) (03/22 0744) Temp Source: Oral (03/22 0744) BP: 142/67 (03/22 0800) Pulse Rate: 82 (03/22 0800)  Labs: Recent Labs    10/28/17 0156 10/28/17 0838 10/29/17 0435 10/30/17 0407  HGB 7.7* 7.9* 8.3* 7.9*  HCT 23.7* 25.0* 26.2* 25.2*  PLT 327 344 295 293  LABPROT 24.6*  --  30.5* 30.1*  INR 2.24  --  2.95 2.91  HEPARINUNFRC 0.25*  --   --   --   CREATININE 8.14* 5.56* 4.85* 5.44*    Estimated Creatinine Clearance: 18.5 mL/min (A) (by C-G formula based on SCr of 5.44 mg/dL (H)).  Assessment: [age] YOM with Afib on Eliquis PTA, transitioned to warfarin given possible PE on Eliquis, s/p heparin bridge. INR is therapeutic today 2.91, down slightly from yesterday. Patient previously required high doses (10-15 mg) of warfarin, INR was slow to rise to goal range. Current INR likely reflects steady state using 15 mg doses. I anticipate the INR will trend back down on lower doses, his maintenance dose is likely ~10 mg daily. HGB is trending down 8.3 > 7.9, pt did receive 2 units PRBC on 3/20. PLT are wnl. No bleeding noted.    Goal of Therapy:  INR 2-3  Monitor platelets by anticoagulation protocol: Yes   Plan: Warfarin 10 mg PO x 1 tonight Daily INR Monitor CBC, s/sx of bleeding   Al CorpusLindsey Hiep Ollis, PharmD PGY1 Pharmacy Resident Pager: [phone] After 4:00PM please call Main Pharmacy [phone] 10/30/2017 10:38 AM

## 2017-10-30 NOTE — Progress Notes (Signed)
PULMONARY / CRITICAL CARE MEDICINE   Name: Chris [name]. MRN: [id number] DOB: [birth year]    ADMISSION DATE:  10/01/2017 CONSULTATION DATE:  2/21  REFERRING MD:  Juleen China  CHIEF COMPLAINT:  SOB  HISTORY OF PRESENT ILLNESS:   52 yo male presented with dyspnea, cough, fever, hypoxia.  Found to have Influenza PNA causing ARDS.  Course complicated by hypervolemia and renal failure.  PMHx of HIV, A fib, COPD, DM.   SUBJECTIVE:  Comfortable  VITAL SIGNS: Blood Pressure (Abnormal) 150/69   Pulse 88   Temperature 98.8 F (37.1 C) (Oral)   Respiration (Abnormal) 22   Height 5\' 9"  (1.753 m)   Weight 213 lb 13.5 oz (97 kg)   Oxygen Saturation 91%   Body Mass Index 31.58 kg/m   HEMODYNAMICS:    VENTILATOR SETTINGS: Vent Mode: PCV FiO2 (%):  [40 %] 40 % Set Rate:  [16 bmp] 16 bmp PEEP:  [5 cmH20] 5 cmH20 Pressure Support:  [5 cmH20] 5 cmH20 Plateau Pressure:  [17 cmH20-21 cmH20] 21 cmH20  INTAKE / OUTPUT: I/O last 3 completed shifts: In: 2401 [I.V.:6; NG/GT:2395] Out: 985 [Urine:985]  PHYSICAL EXAMINATION: General resting comf on vent NAD HENT NCAT no JVD  pulm scattered rhonchi Card rrr Ext brisk Cr still w diffuse anasarca  abd soft Neuro/psych awake and appropriate  LABS:  BMET Recent Labs  Lab 10/28/17 0838 10/29/17 0435 10/30/17 0407  NA 130* 131* 132*  K 4.5 4.3 4.5  CL 90* 89* 89*  CO2 26 27 25   BUN 99* 85* 117*  CREATININE 5.56* 4.85* 5.44*  GLUCOSE 174* 236* 222*   Electrolytes Recent Labs  Lab 10/28/17 0156 10/28/17 0838 10/29/17 0435 10/30/17 0407  CALCIUM 8.8* 8.7* 8.8* 8.8*  MG 3.2*  --  2.6* 2.7*  PHOS 10.9* 7.4* 7.3* 9.0*   CBC Recent Labs  Lab 10/28/17 0838 10/29/17 0435 10/30/17 0407  WBC 8.3 6.3 7.3  HGB 7.9* 8.3* 7.9*  HCT 25.0* 26.2* 25.2*  PLT 344 295 293   Coag's Recent Labs  Lab 10/28/17 0156 10/29/17 0435 10/30/17 0407  INR 2.24 2.95 2.91    Sepsis Markers No results for input(s): LATICACIDVEN,  PROCALCITON, O2SATVEN in the last 168 hours.  ABG No results for input(s): PHART, PCO2ART, PO2ART in the last 168 hours.  Liver Enzymes Recent Labs  Lab 10/28/17 0838 10/29/17 0435 10/30/17 0407  ALBUMIN 2.4* 2.3* 2.2*    Cardiac Enzymes No results for input(s): TROPONINI, PROBNP in the last 168 hours.  Glucose Recent Labs  Lab 10/29/17 1542 10/29/17 2019 10/29/17 2342 10/30/17 0350 10/30/17 0748 10/30/17 1145  GLUCAP 103* 122* 145* 213* 196* 156*    Imaging No results found.  STUDIES:  Echo 2/25 >> EF 60 to 65%, mild MR Renal u/s 2/28 >> slight fullness of renal collecting system  CULTURES: Respiratory viral panel 2/21 >> Influenza A H3 Blood 2/21 >> negative Sputum 2/22 >> oral flora Urine 2/26 >> negative Sputum 2/26 >> oral flora Blood 2/27 >> negative Sputum 3/5>>> oral flora Blood 3/6>>> negative  ANTIBIOTICS: Vancomycin 2/21 >>2/22 Cefepime 2/21 >> 2/28 Tamiflu 2/21 >> 2/26 Fluconazole 2/21 >> 2/27 Anidulafungin 2/27 >>3/6 Zithromax 2/27 >> 3/1  SIGNIFICANT EVENTS: 2/21 Admit 2/22 VDRF 2/27 ID consulted for persistent fever 2/28 Renal consult for AKI, Cardiology consult for A fib with RVR 3/01 Trach 3/03 Attempted HD 3/04 Changed to CRRT 3/05 Worsening hypoxia >> vent settings adjusted 3/12 CRRT stopped  3/13 first iHD; PSV x 8 hrs  LINES/TUBES: ETT 2/22 >> 3/1 Left IJ 2/27 >>> Trach 3/01 >>> R IJ HD cath 3/03 >>>    ASSESSMENT / PLAN:  Acute hypoxic/hypercapnic respiratory failure from Influenza PNA. Hx of COPD with emphysema. Purulent tracheobronchitis. A fib with RVR. Acute on chronic diastolic CHF with hx of HTN. Hypotension  Acute renal failure 2nd to ATN- remains anuric Hyperkalemia. Anemia of critical illness and chronic disease Esophageal candidiasis. Hx of HIV. DM type II. Acute metabolic encephalopathy >> improved 3/09. Hyperkalemia  Depression   DISCUSSION:  52 year old male patient with a history of HIV,  atrial fibrillation, chronic obstructive pulmonary disease admitted initially for acute hypoxic respiratory failure in the setting of influenza and resultant ARDS.  Ileostomy was placed on 3/1, major barriers have been acute on chronic renal failure, persistent encephalopathy, and deconditioning following prolonged critical illness.  CRRT was stopped on 312 and he is now on intermittent dialysis Interval from last critical care visit, 3/12 to  3/18: -Completed prednisone taper on 3/12, 7-day course of Zosyn ended as well. -3/13: Pressure support ventilation Tolerated for almost 8 hours -Vas-Cath placed 3/16 -Last note from nephrology still hopeful for renal recovery but not making much urine -desaturated last night 3/17 ? Volume overload ? ? Anxiety related.  -3/18 refusing dialysis and again on 3/19 -3/20 getting HD   Plan/rec PSV as tolerated Volume removal w/ HD Agree w LTAC In-line PMV as tolerated    Simonne MartinetPeter E Skyylar Kopf ACNP-BC [facility] Pager # [phone] OR # [phone] if no answer      I

## 2017-10-30 NOTE — Progress Notes (Signed)
[facility] ROUNDING NOTE   Subjective:    52 yo AAM HIV AF COPD MO Admitted w/resp failure, VDRF. + Flu A/PNA; possible PE; Baseline creatinine 0.79 10/01/17. AKI in setting of above issues. CRRT initiated 10/12/17 for massive anasarca /hyperkalemia. R IJ temp cath 3/3 by CCM.   Was refusing dialysis yesterday and will proceed to dialysis today  Still urine output looking a little better     Objective:  Vital signs in last 24 hours:  Temp:  [98.4 F (36.9 C)-99 F (37.2 C)] 98.4 F (36.9 C) (03/22 0744) Pulse Rate:  [71-90] 82 (03/22 0800) Resp:  [14-24] 24 (03/22 0800) BP: (110-153)/(55-91) 142/67 (03/22 0800) SpO2:  [86 %-99 %] 92 % (03/22 0841) FiO2 (%):  [40 %] 40 % (03/22 0739) Weight:  [213 lb 13.5 oz (97 kg)] 213 lb 13.5 oz (97 kg) (03/22 0331)  Weight change: -3 lb 1.4 oz (-1.4 kg) Filed Weights   10/28/17 1122 10/29/17 0500 10/30/17 0331  Weight: 208 lb 1.8 oz (94.4 kg) 213 lb 6.5 oz (96.8 kg) 213 lb 13.5 oz (97 kg)    Intake/Output: I/O last 3 completed shifts: In: 2401 [I.V.:6; NG/GT:2395] Out: 985 [Urine:985]   Intake/Output this shift:  Total I/O In: -  Out: 325 [Urine:325]  CVS- RRR RS- CTA trach' ABD- distended EXT- no edema   Basic Metabolic Panel: Recent Labs  Lab 10/27/17 0411 10/28/17 0156 10/28/17 0838 10/29/17 0435 10/30/17 0407  NA 125* 125* 130* 131* 132*  K 6.8* 6.5* 4.5 4.3 4.5  CL 84* 84* 90* 89* 89*  CO2 21* 21* '26 27 25  ' GLUCOSE 201* 154* 174* 236* 222*  BUN 131* 143* 99* 85* 117*  CREATININE 7.64* 8.14* 5.56* 4.85* 5.44*  CALCIUM 8.9 8.8* 8.7* 8.8* 8.8*  MG 3.2* 3.2*  --  2.6* 2.7*  PHOS 10.0* 10.9* 7.4* 7.3* 9.0*    Liver Function Tests: Recent Labs  Lab 10/25/17 0338 10/27/17 0411 10/28/17 0838 10/29/17 0435 10/30/17 0407  ALBUMIN 2.1* 2.3* 2.4* 2.3* 2.2*   No results for input(s): LIPASE, AMYLASE in the last 168 hours. No results for input(s): AMMONIA in the last 168 hours.  CBC: Recent  Labs  Lab 10/27/17 0411 10/28/17 0156 10/28/17 0838 10/29/17 0435 10/30/17 0407  WBC 9.1 7.8 8.3 6.3 7.3  HGB 7.8* 7.7* 7.9* 8.3* 7.9*  HCT 24.2* 23.7* 25.0* 26.2* 25.2*  MCV 92.4 92.9 92.9 95.6 94.4  PLT 328 327 344 295 293    Cardiac Enzymes: No results for input(s): CKTOTAL, CKMB, CKMBINDEX, TROPONINI in the last 168 hours.  BNP: Invalid input(s): POCBNP  CBG: Recent Labs  Lab 10/29/17 1542 10/29/17 2019 10/29/17 2342 10/30/17 0350 10/30/17 0748  GLUCAP 103* 122* 145* 213* 196*    Microbiology: Results for orders placed or performed during the hospital encounter of 10/01/17  Culture, blood (Routine X 2) w Reflex to ID Panel     Status: None   Collection Time: 10/01/17  7:15 PM  Result Value Ref Range Status   Specimen Description BLOOD RIGHT HAND  Final   Special Requests   Final    BOTTLES DRAWN AEROBIC AND ANAEROBIC Blood Culture adequate volume   Culture   Final    NO GROWTH 5 DAYS Performed at [facility], [place] [address], [place], [place] [address]    Report Status 10/06/2017 FINAL  Final  Culture, blood (Routine X 2) w Reflex to ID Panel     Status: None   Collection  Time: 10/01/17  7:21 PM  Result Value Ref Range Status   Specimen Description BLOOD RIGHT WRIST  Final   Special Requests   Final    BOTTLES DRAWN AEROBIC AND ANAEROBIC Blood Culture adequate volume   Culture   Final    NO GROWTH 5 DAYS Performed at [facility], [address], [place], [place] [address]    Report Status 10/06/2017 FINAL  Final  Respiratory Panel by PCR     Status: Abnormal   Collection Time: 10/01/17  9:02 PM  Result Value Ref Range Status   Adenovirus NOT DETECTED NOT DETECTED Final   Coronavirus 229E NOT DETECTED NOT DETECTED Final   Coronavirus HKU1 NOT DETECTED NOT DETECTED Final   Coronavirus NL63 NOT DETECTED NOT DETECTED Final   Coronavirus OC43 NOT DETECTED NOT DETECTED Final   Metapneumovirus NOT DETECTED NOT DETECTED Final   Rhinovirus /  Enterovirus NOT DETECTED NOT DETECTED Final   Influenza A H3 DETECTED (A) NOT DETECTED Final   Influenza B NOT DETECTED NOT DETECTED Final   Parainfluenza Virus 1 NOT DETECTED NOT DETECTED Final   Parainfluenza Virus 2 NOT DETECTED NOT DETECTED Final   Parainfluenza Virus 3 NOT DETECTED NOT DETECTED Final   Parainfluenza Virus 4 NOT DETECTED NOT DETECTED Final   Respiratory Syncytial Virus NOT DETECTED NOT DETECTED Final   Bordetella pertussis NOT DETECTED NOT DETECTED Final   Chlamydophila pneumoniae NOT DETECTED NOT DETECTED Final   Mycoplasma pneumoniae NOT DETECTED NOT DETECTED Final    Comment: Performed at [facility], [place] [address] [place], [place] [address]  MRSA PCR Screening     Status: None   Collection Time: 10/01/17  9:02 PM  Result Value Ref Range Status   MRSA by PCR NEGATIVE NEGATIVE Final    Comment:        The GeneXpert MRSA Assay (FDA approved for NASAL specimens only), is one component of a comprehensive MRSA colonization surveillance program. It is not intended to diagnose MRSA infection nor to guide or monitor treatment for MRSA infections. Performed at [facility], [place] [address], [place], [place] [address]   Culture, respiratory (NON-Expectorated)     Status: None   Collection Time: 10/02/17 11:53 AM  Result Value Ref Range Status   Specimen Description TRACHEAL ASPIRATE  Final   Special Requests NONE  Final   Gram Stain   Final    FEW WBC PRESENT, PREDOMINANTLY PMN RARE GRAM POSITIVE COCCI RARE GRAM POSITIVE RODS    Culture   Final    MODERATE Consistent with normal respiratory flora. Performed at [facility], [place] [address], [name], [place] [address]    Report Status 10/05/2017 FINAL  Final  Urine Culture     Status: None   Collection Time: 10/06/17 10:38 AM  Result Value Ref Range Status   Specimen Description URINE, CATHETERIZED  Final   Special Requests NONE  Final   Culture   Final    NO GROWTH Performed at [facility], [place] [address], [place], [place] [address]    Report Status 10/07/2017 FINAL  Final  Culture, respiratory (NON-Expectorated)     Status: None   Collection Time: 10/06/17 12:27 PM  Result Value Ref Range Status   Specimen Description TRACHEAL ASPIRATE  Final   Special Requests NONE  Final   Gram Stain   Final    MODERATE WBC PRESENT,BOTH PMN AND MONONUCLEAR NO ORGANISMS SEEN    Culture  Final    MODERATE Consistent with normal respiratory flora. Performed at [facility], [place] [address] [place], [place] [address]    Report Status 10/08/2017 FINAL  Final  Culture, blood (routine x 2)     Status: None   Collection Time: 10/07/17 11:00 AM  Result Value Ref Range Status   Specimen Description BLOOD RIGHT ANTECUBITAL  Final   Special Requests   Final    BOTTLES DRAWN AEROBIC AND ANAEROBIC Blood Culture adequate volume   Culture   Final    NO GROWTH 5 DAYS Performed at [facility], [place] [address], [place], [place] [address]    Report Status 10/12/2017 FINAL  Final  Culture, blood (routine x 2)     Status: None   Collection Time: 10/07/17 11:15 AM  Result Value Ref Range Status   Specimen Description BLOOD RIGHT HAND  Final   Special Requests   Final    BOTTLES DRAWN AEROBIC AND ANAEROBIC Blood Culture adequate volume   Culture   Final    NO GROWTH 5 DAYS Performed at [facility], [place] [address] [place], [place] [address]    Report Status 10/12/2017 FINAL  Final  Culture, respiratory (NON-Expectorated)     Status: None   Collection Time: 10/13/17 10:06 AM  Result Value Ref Range Status   Specimen Description TRACHEAL ASPIRATE  Final   Special Requests NONE  Final   Gram Stain   Final    RARE WBC PRESENT, PREDOMINANTLY PMN RARE GRAM POSITIVE COCCI RARE GRAM VARIABLE ROD    Culture   Final    Consistent with normal respiratory flora. Performed at [facility], [place] [address] [place], [place] [address]    Report Status 10/16/2017  FINAL  Final  Culture, blood (Routine X 2) w Reflex to ID Panel     Status: None   Collection Time: 10/14/17  9:18 AM  Result Value Ref Range Status   Specimen Description BLOOD WRIST LEFT  Final   Special Requests   Final    BOTTLES DRAWN AEROBIC ONLY Blood Culture adequate volume   Culture   Final    NO GROWTH 5 DAYS Performed at [facility], [place] [address], [place], [place] [address]    Report Status 10/19/2017 FINAL  Final  Culture, blood (Routine X 2) w Reflex to ID Panel     Status: None   Collection Time: 10/14/17  9:19 AM  Result Value Ref Range Status   Specimen Description BLOOD LEFT HAND  Final   Special Requests IN PEDIATRIC BOTTLE Blood Culture adequate volume  Final   Culture   Final    NO GROWTH 5 DAYS Performed at [facility], [place] [address], [place], [place] [address]    Report Status 10/19/2017 FINAL  Final    Coagulation Studies: Recent Labs    10/28/17 0156 10/29/17 0435 10/30/17 0407  LABPROT 24.6* 30.5* 30.1*  INR 2.24 2.95 2.91    Urinalysis: No results for input(s): COLORURINE, LABSPEC, PHURINE, GLUCOSEU, HGBUR, BILIRUBINUR, KETONESUR, PROTEINUR, UROBILINOGEN, NITRITE, LEUKOCYTESUR in the last 72 hours.  Invalid input(s): APPERANCEUR    Imaging: No results found.   Medications:   . sodium chloride Stopped (10/24/17 0508)  . sodium chloride    . sodium chloride Stopped (10/28/17 1058)  . dextrose Stopped (10/27/17 1227)   . amiodarone  200 mg Per Tube Daily  . chlorhexidine gluconate (MEDLINE KIT)  15 mL Mouth Rinse  BID  . Chlorhexidine Gluconate Cloth  6 each Topical Daily  . clonazePAM  0.25 mg Per Tube Daily  . clonazePAM  0.5 mg Per Tube QHS  . diltiazem  60 mg Per Tube Q6H  . docusate  100 mg Per Tube BID  . dolutegravir  50 mg Oral Daily  . emtricitabine  200 mg Oral Once every 4 days  . famotidine  20 mg Per Tube QHS  . feeding supplement (VITAL HIGH PROTEIN)  1,000 mL Per Tube Q24H  . insulin aspart  0-20 Units  Subcutaneous Q4H  . insulin glargine  20 Units Subcutaneous BID  . ipratropium-albuterol  3 mL Nebulization TID  . mouth rinse  15 mL Mouth Rinse QID  . methylPREDNISolone (SOLU-MEDROL) injection  40 mg Intravenous Q24H  . PARoxetine  20 mg Oral QHS  . polyethylene glycol  17 g Oral Daily  . scopolamine  1 patch Transdermal Q72H  . sennosides  5 mL Per Tube QHS  . sevelamer carbonate  2.4 g Per Tube TID WC  . sodium chloride flush  10-40 mL Intracatheter Q12H  . sodium chloride flush  3 mL Intravenous Q12H  . tenofovir  300 mg Oral Q Wed  . warfarin  10 mg Oral ONCE-1800  . Warfarin - Pharmacist Dosing Inpatient   Does not apply q1800   sodium chloride, sodium chloride, sodium chloride, acetaminophen (TYLENOL) oral liquid 160 mg/5 mL, alteplase, bisacodyl, fentaNYL (SUBLIMAZE) injection, heparin, heparin, hydrALAZINE, ipratropium-albuterol, lidocaine (PF), lidocaine-prilocaine, metoprolol tartrate, midazolam, ondansetron (ZOFRAN) IV, oxyCODONE, pentafluoroprop-tetrafluoroeth, sodium chloride flush, traMADol  Assessment/ Plan:   Acute kidney injury with a creatinine that has continued to worsen   Urine output still marginal but has improved  Will plan dialysis today  VDRF  Flu A  No antibiotics at this time    HIV    Anemia  Continue aranesp   LOS: 77 Chris [name] W '@TODAY' '@11' :15 AM

## 2017-11-01 ENCOUNTER — Other Ambulatory Visit: Payer: Self-pay | Admitting: Nephrology

## 2017-11-09 ENCOUNTER — Ambulatory Visit: Payer: Medicare HMO | Admitting: Cardiology

## 2017-11-10 ENCOUNTER — Encounter: Payer: Self-pay | Admitting: *Deleted

## 2017-11-11 IMAGING — US US ABDOMEN COMPLETE
1 series · 13 of 25 positions shown · non-contrast
Comparison: None.

CLINICAL DATA: 50-year-old male with epigastric abdominal pain

EXAM:
ABDOMEN ULTRASOUND COMPLETE

[Series 1: us abdomen complete · 0.23mm/px · 13 of 87 slices shown]
[im 1/87]
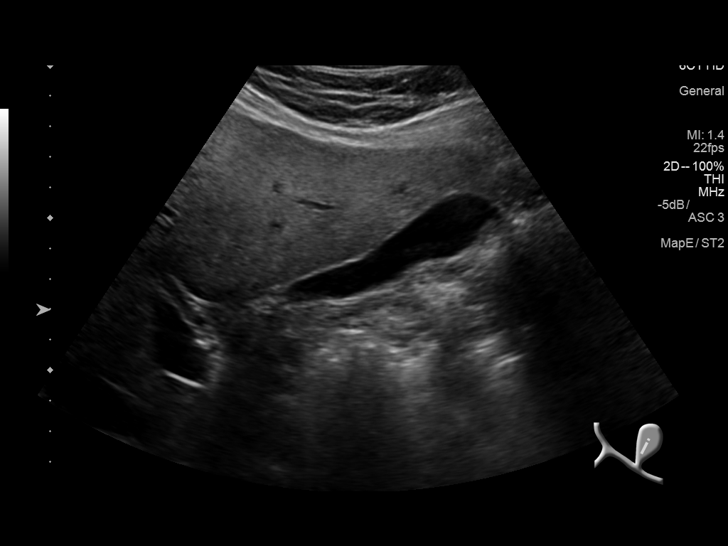
[im 8/87]
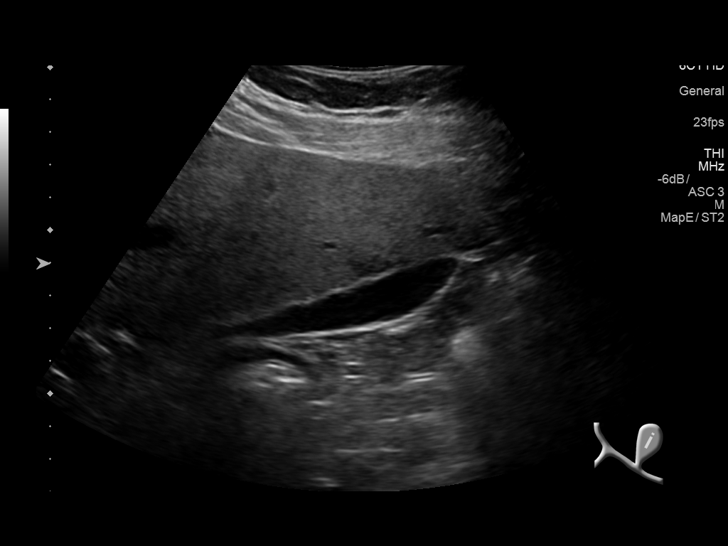
[im 15/87]
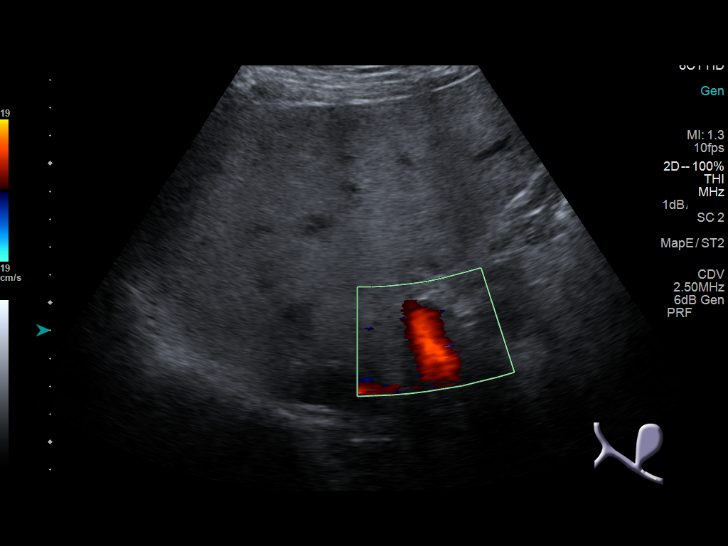
[im 22/87]
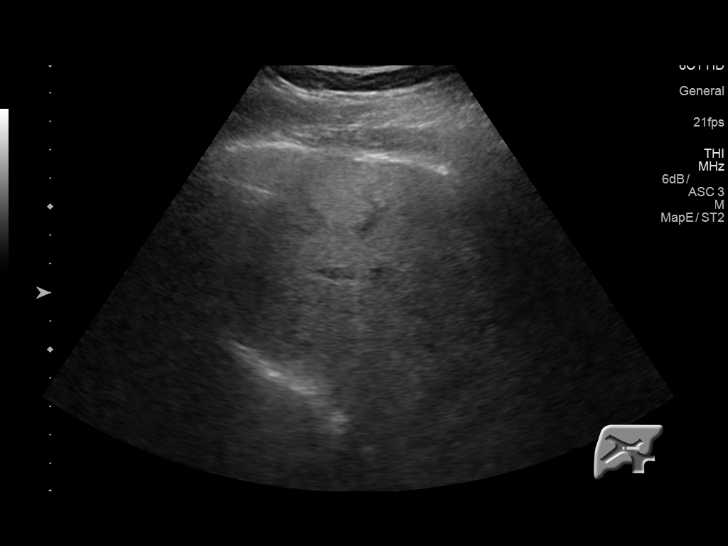
[im 29/87]
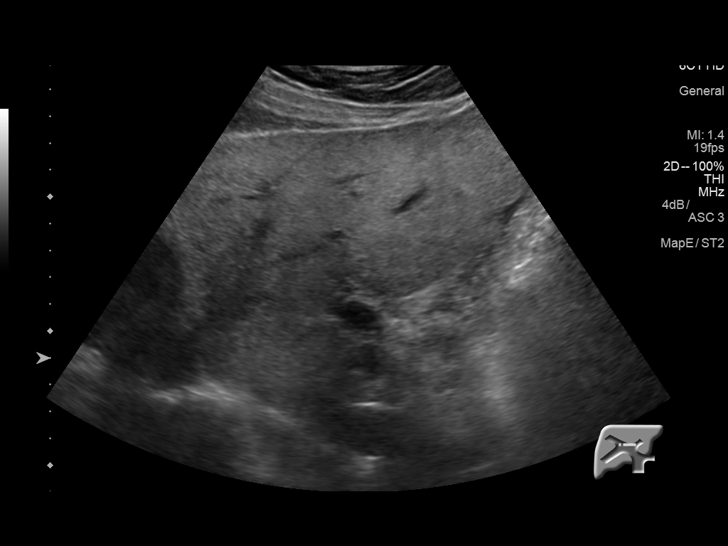
[im 36/87]
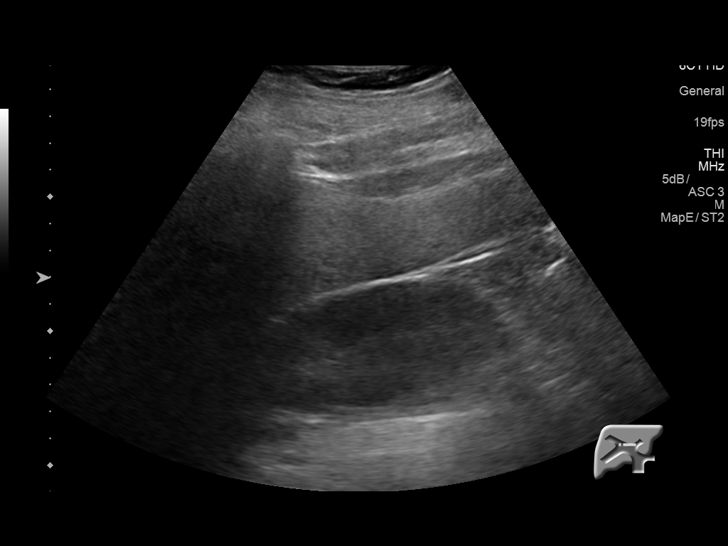
[im 44/87]
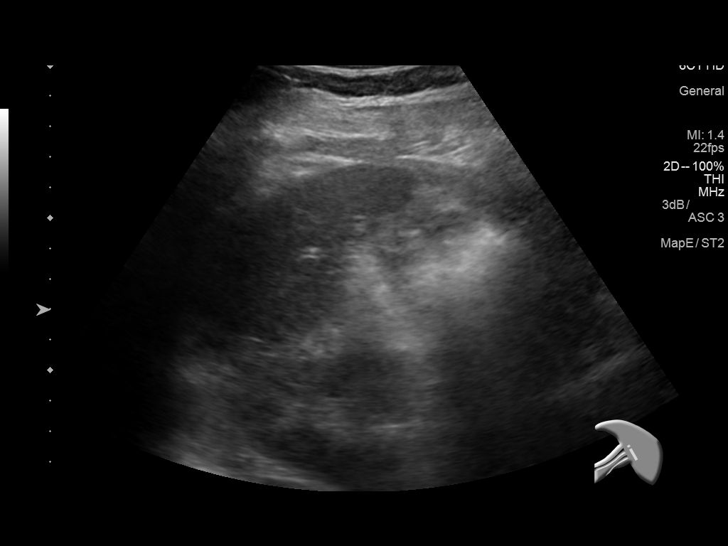
[im 51/87]
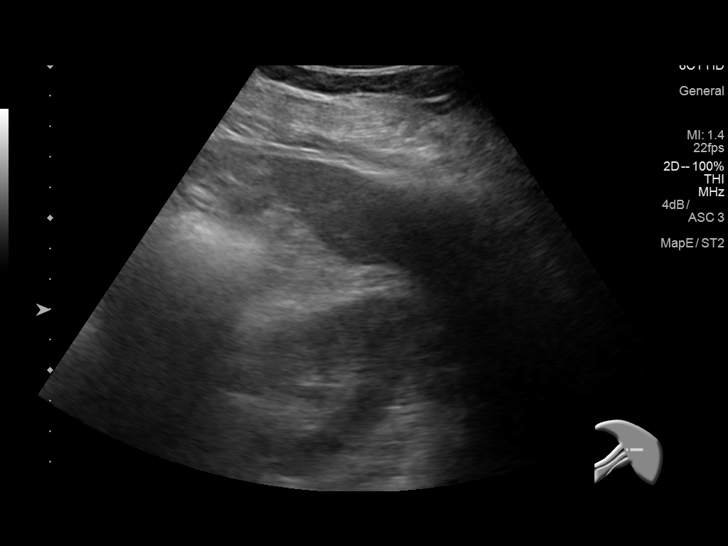
[im 58/87]
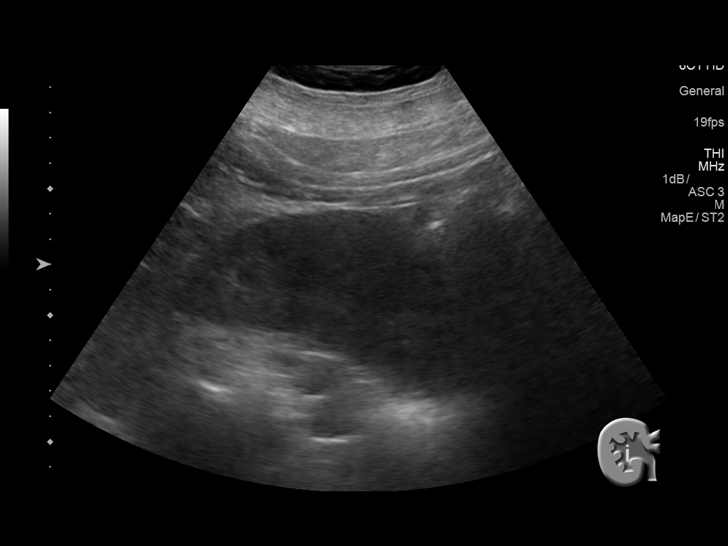
[im 65/87]
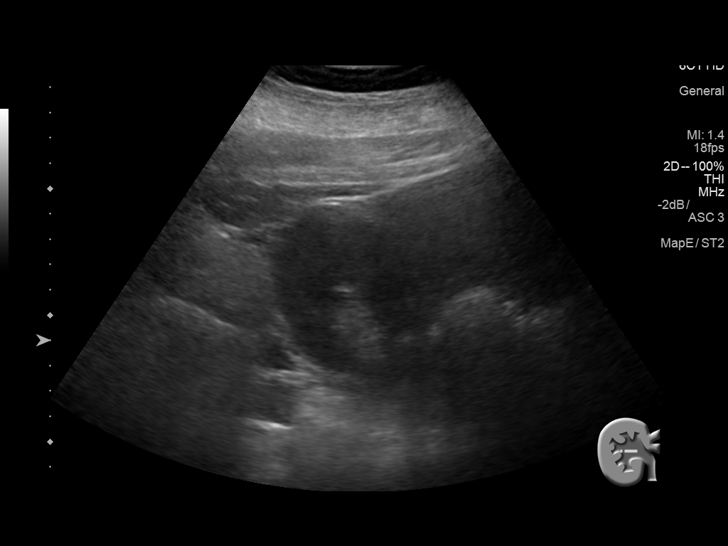
[im 72/87]
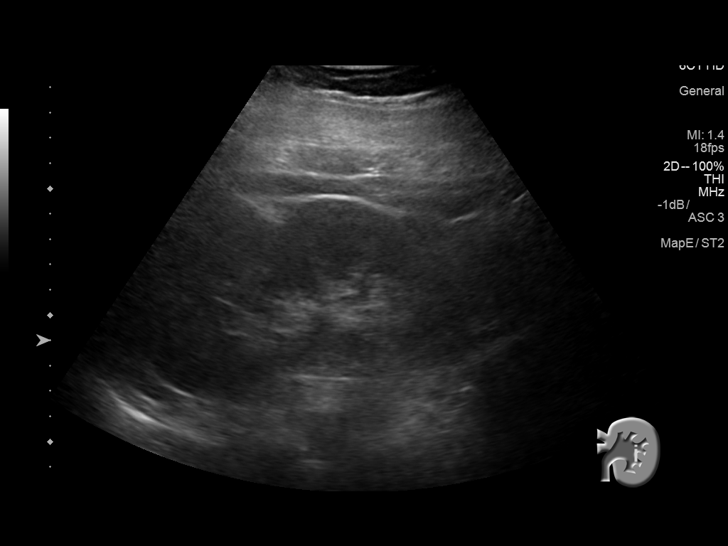
[im 79/87]
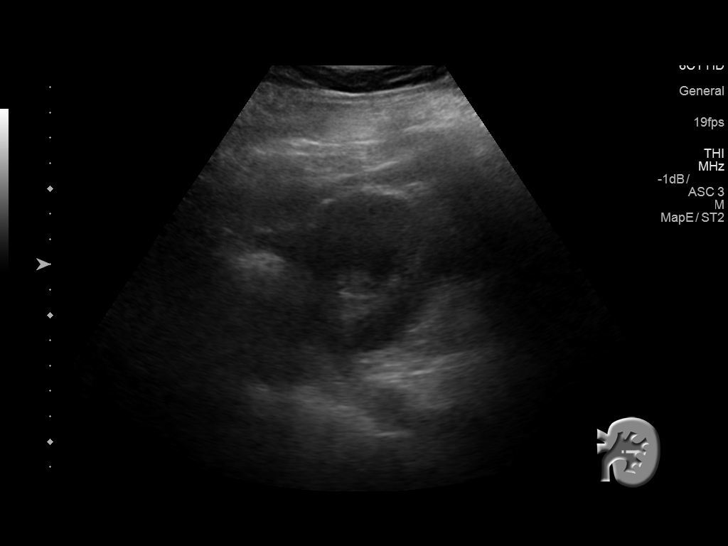
[im 87/87]
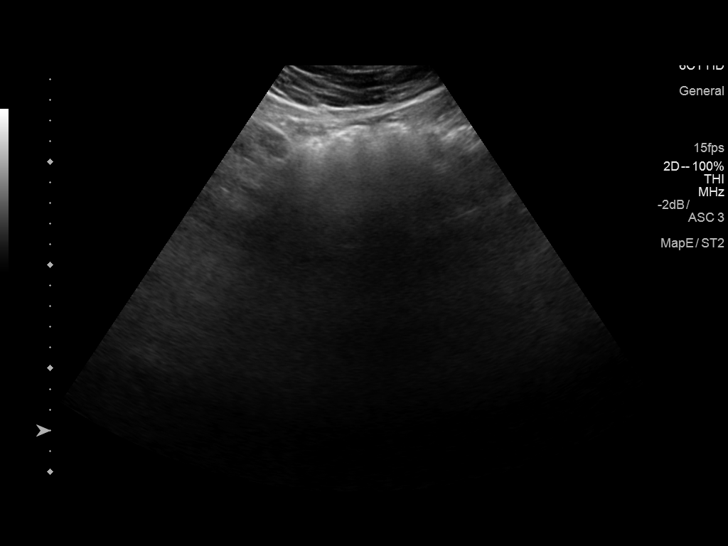

[13 of 25 positions shown; findings below may reference images not displayed]

FINDINGS: Gallbladder: No gallstones or wall thickening visualized. No
sonographic Murphy sign noted by sonographer.

Common bile duct: Diameter: Within normal limits at 3 mm

Liver: The hepatic parenchyma is echogenic and there is slight
coarsening of the echotexture. The adjacent renal parenchyma appears
hypoechoic in comparison. These findings are most suggestive of
hepatic steatosis. No discrete lesion is identified. The main portal
vein is patent.

IVC: No abnormality visualized.

Pancreas: Obscured by overlying bowel gas.

Spleen: Size and appearance within normal limits.

Right Kidney: Length: 13.1 cm. Echogenicity within normal limits. No
mass or hydronephrosis visualized.

Left Kidney: Length: 13.1 cm. Echogenicity within normal limits. No
mass or hydronephrosis visualized.

Abdominal aorta: No aneurysm visualized. Visualization of the mid
and distal aorta is limited by bowel gas.

Other findings: None.
IMPRESSION: 1. Hepatic steatosis.
2. No evidence of cholelithiasis or biliary ductal dilatation.
3. Limited visualization of the pancreas and abdominal aorta in the
mid and distal abdomen secondary to obscuring bowel gas.

## 2017-11-18 ENCOUNTER — Telehealth: Payer: Self-pay | Admitting: Cardiology

## 2017-11-18 NOTE — Telephone Encounter (Signed)
Chris [name] calling because she is stating that they received a letter saying that Chris [name] going to be charged a $50 no show fee and is stating that she didn't know about this appt because Mr. [name] was in the hospital . Please call l

## 2017-11-18 NOTE — Telephone Encounter (Signed)
Returned patient's call and left vm message that he WILL NOT be receiving a no show charge for this visit.

## 2017-11-19 DIAGNOSIS — E1165 Type 2 diabetes mellitus with hyperglycemia: Secondary | ICD-10-CM | POA: Diagnosis not present

## 2017-11-19 DIAGNOSIS — Z9981 Dependence on supplemental oxygen: Secondary | ICD-10-CM | POA: Diagnosis not present

## 2017-11-19 DIAGNOSIS — J9621 Acute and chronic respiratory failure with hypoxia: Secondary | ICD-10-CM | POA: Diagnosis not present

## 2017-11-19 DIAGNOSIS — E119 Type 2 diabetes mellitus without complications: Secondary | ICD-10-CM | POA: Diagnosis present

## 2017-11-19 DIAGNOSIS — R69 Illness, unspecified: Secondary | ICD-10-CM | POA: Diagnosis not present

## 2017-11-19 DIAGNOSIS — Z808 Family history of malignant neoplasm of other organs or systems: Secondary | ICD-10-CM | POA: Diagnosis not present

## 2017-11-19 DIAGNOSIS — I472 Ventricular tachycardia: Secondary | ICD-10-CM | POA: Diagnosis not present

## 2017-11-19 DIAGNOSIS — F329 Major depressive disorder, single episode, unspecified: Secondary | ICD-10-CM | POA: Diagnosis not present

## 2017-11-19 DIAGNOSIS — J09X1 Influenza due to identified novel influenza A virus with pneumonia: Secondary | ICD-10-CM | POA: Diagnosis not present

## 2017-11-19 DIAGNOSIS — B3781 Candidal esophagitis: Secondary | ICD-10-CM | POA: Diagnosis not present

## 2017-11-19 DIAGNOSIS — R498 Other voice and resonance disorders: Secondary | ICD-10-CM | POA: Diagnosis not present

## 2017-11-19 DIAGNOSIS — G894 Chronic pain syndrome: Secondary | ICD-10-CM | POA: Diagnosis not present

## 2017-11-19 DIAGNOSIS — R2689 Other abnormalities of gait and mobility: Secondary | ICD-10-CM | POA: Diagnosis not present

## 2017-11-19 DIAGNOSIS — Z8709 Personal history of other diseases of the respiratory system: Secondary | ICD-10-CM | POA: Diagnosis not present

## 2017-11-19 DIAGNOSIS — Z882 Allergy status to sulfonamides status: Secondary | ICD-10-CM | POA: Diagnosis not present

## 2017-11-19 DIAGNOSIS — M7989 Other specified soft tissue disorders: Secondary | ICD-10-CM | POA: Diagnosis not present

## 2017-11-19 DIAGNOSIS — J9600 Acute respiratory failure, unspecified whether with hypoxia or hypercapnia: Secondary | ICD-10-CM | POA: Diagnosis not present

## 2017-11-19 DIAGNOSIS — B20 Human immunodeficiency virus [HIV] disease: Secondary | ICD-10-CM | POA: Diagnosis not present

## 2017-11-19 DIAGNOSIS — J9610 Chronic respiratory failure, unspecified whether with hypoxia or hypercapnia: Secondary | ICD-10-CM | POA: Diagnosis not present

## 2017-11-19 DIAGNOSIS — Z79899 Other long term (current) drug therapy: Secondary | ICD-10-CM | POA: Diagnosis not present

## 2017-11-19 DIAGNOSIS — R1312 Dysphagia, oropharyngeal phase: Secondary | ICD-10-CM | POA: Diagnosis not present

## 2017-11-19 DIAGNOSIS — J439 Emphysema, unspecified: Secondary | ICD-10-CM | POA: Diagnosis not present

## 2017-11-19 DIAGNOSIS — I480 Paroxysmal atrial fibrillation: Secondary | ICD-10-CM | POA: Diagnosis not present

## 2017-11-19 DIAGNOSIS — J129 Viral pneumonia, unspecified: Secondary | ICD-10-CM | POA: Diagnosis not present

## 2017-11-19 DIAGNOSIS — Z992 Dependence on renal dialysis: Secondary | ICD-10-CM | POA: Diagnosis not present

## 2017-11-19 DIAGNOSIS — J431 Panlobular emphysema: Secondary | ICD-10-CM | POA: Diagnosis not present

## 2017-11-19 DIAGNOSIS — J9612 Chronic respiratory failure with hypercapnia: Secondary | ICD-10-CM | POA: Diagnosis not present

## 2017-11-19 DIAGNOSIS — Z7901 Long term (current) use of anticoagulants: Secondary | ICD-10-CM | POA: Diagnosis not present

## 2017-11-19 DIAGNOSIS — J9611 Chronic respiratory failure with hypoxia: Secondary | ICD-10-CM | POA: Diagnosis not present

## 2017-11-19 DIAGNOSIS — Z794 Long term (current) use of insulin: Secondary | ICD-10-CM | POA: Diagnosis not present

## 2017-11-19 DIAGNOSIS — I110 Hypertensive heart disease with heart failure: Secondary | ICD-10-CM | POA: Diagnosis not present

## 2017-11-19 DIAGNOSIS — F17210 Nicotine dependence, cigarettes, uncomplicated: Secondary | ICD-10-CM | POA: Diagnosis not present

## 2017-11-19 DIAGNOSIS — J449 Chronic obstructive pulmonary disease, unspecified: Secondary | ICD-10-CM | POA: Diagnosis not present

## 2017-11-19 DIAGNOSIS — Z7951 Long term (current) use of inhaled steroids: Secondary | ICD-10-CM | POA: Diagnosis not present

## 2017-11-19 DIAGNOSIS — M6281 Muscle weakness (generalized): Secondary | ICD-10-CM | POA: Diagnosis not present

## 2017-11-19 DIAGNOSIS — E118 Type 2 diabetes mellitus with unspecified complications: Secondary | ICD-10-CM | POA: Diagnosis not present

## 2017-11-19 DIAGNOSIS — K219 Gastro-esophageal reflux disease without esophagitis: Secondary | ICD-10-CM | POA: Diagnosis not present

## 2017-11-19 DIAGNOSIS — B999 Unspecified infectious disease: Secondary | ICD-10-CM | POA: Diagnosis not present

## 2017-11-19 DIAGNOSIS — I5032 Chronic diastolic (congestive) heart failure: Secondary | ICD-10-CM | POA: Diagnosis not present

## 2017-11-19 DIAGNOSIS — I10 Essential (primary) hypertension: Secondary | ICD-10-CM | POA: Diagnosis not present

## 2017-11-19 DIAGNOSIS — M79672 Pain in left foot: Secondary | ICD-10-CM | POA: Diagnosis not present

## 2017-11-19 DIAGNOSIS — I482 Chronic atrial fibrillation: Secondary | ICD-10-CM | POA: Diagnosis not present

## 2017-11-19 DIAGNOSIS — Z9911 Dependence on respirator [ventilator] status: Secondary | ICD-10-CM | POA: Diagnosis not present

## 2017-11-19 DIAGNOSIS — N179 Acute kidney failure, unspecified: Secondary | ICD-10-CM | POA: Diagnosis not present

## 2017-11-19 DIAGNOSIS — Z21 Asymptomatic human immunodeficiency virus [HIV] infection status: Secondary | ICD-10-CM | POA: Diagnosis not present

## 2017-11-19 DIAGNOSIS — I5033 Acute on chronic diastolic (congestive) heart failure: Secondary | ICD-10-CM | POA: Diagnosis not present

## 2017-11-20 DIAGNOSIS — Z992 Dependence on renal dialysis: Secondary | ICD-10-CM | POA: Diagnosis not present

## 2017-11-20 DIAGNOSIS — R69 Illness, unspecified: Secondary | ICD-10-CM | POA: Diagnosis not present

## 2017-11-20 DIAGNOSIS — Z8709 Personal history of other diseases of the respiratory system: Secondary | ICD-10-CM | POA: Diagnosis not present

## 2017-11-20 DIAGNOSIS — J09X1 Influenza due to identified novel influenza A virus with pneumonia: Secondary | ICD-10-CM | POA: Diagnosis not present

## 2017-11-24 ENCOUNTER — Ambulatory Visit: Payer: Medicare HMO | Admitting: Internal Medicine

## 2017-11-26 DIAGNOSIS — Z8709 Personal history of other diseases of the respiratory system: Secondary | ICD-10-CM | POA: Diagnosis not present

## 2017-11-26 DIAGNOSIS — R69 Illness, unspecified: Secondary | ICD-10-CM | POA: Diagnosis not present

## 2017-11-26 DIAGNOSIS — J449 Chronic obstructive pulmonary disease, unspecified: Secondary | ICD-10-CM | POA: Diagnosis not present

## 2017-11-26 DIAGNOSIS — Z992 Dependence on renal dialysis: Secondary | ICD-10-CM | POA: Diagnosis not present

## 2017-11-27 DIAGNOSIS — F17210 Nicotine dependence, cigarettes, uncomplicated: Secondary | ICD-10-CM | POA: Diagnosis not present

## 2017-11-27 DIAGNOSIS — R69 Illness, unspecified: Secondary | ICD-10-CM | POA: Diagnosis not present

## 2017-11-27 DIAGNOSIS — M79672 Pain in left foot: Secondary | ICD-10-CM | POA: Diagnosis not present

## 2017-11-27 DIAGNOSIS — I110 Hypertensive heart disease with heart failure: Secondary | ICD-10-CM | POA: Diagnosis not present

## 2017-11-27 DIAGNOSIS — I480 Paroxysmal atrial fibrillation: Secondary | ICD-10-CM | POA: Diagnosis not present

## 2017-11-27 DIAGNOSIS — G894 Chronic pain syndrome: Secondary | ICD-10-CM | POA: Diagnosis not present

## 2017-11-27 DIAGNOSIS — K219 Gastro-esophageal reflux disease without esophagitis: Secondary | ICD-10-CM | POA: Diagnosis not present

## 2017-11-27 DIAGNOSIS — J9610 Chronic respiratory failure, unspecified whether with hypoxia or hypercapnia: Secondary | ICD-10-CM | POA: Diagnosis not present

## 2017-11-27 DIAGNOSIS — M6281 Muscle weakness (generalized): Secondary | ICD-10-CM | POA: Diagnosis not present

## 2017-11-27 DIAGNOSIS — I472 Ventricular tachycardia: Secondary | ICD-10-CM | POA: Diagnosis not present

## 2017-11-27 DIAGNOSIS — J439 Emphysema, unspecified: Secondary | ICD-10-CM | POA: Diagnosis not present

## 2017-11-27 DIAGNOSIS — J9621 Acute and chronic respiratory failure with hypoxia: Secondary | ICD-10-CM | POA: Diagnosis not present

## 2017-11-27 DIAGNOSIS — Z21 Asymptomatic human immunodeficiency virus [HIV] infection status: Secondary | ICD-10-CM | POA: Diagnosis not present

## 2017-11-27 DIAGNOSIS — B3781 Candidal esophagitis: Secondary | ICD-10-CM | POA: Diagnosis not present

## 2017-11-27 DIAGNOSIS — J9612 Chronic respiratory failure with hypercapnia: Secondary | ICD-10-CM | POA: Diagnosis not present

## 2017-11-27 DIAGNOSIS — I5032 Chronic diastolic (congestive) heart failure: Secondary | ICD-10-CM | POA: Diagnosis not present

## 2017-11-27 DIAGNOSIS — Z808 Family history of malignant neoplasm of other organs or systems: Secondary | ICD-10-CM | POA: Diagnosis not present

## 2017-11-27 DIAGNOSIS — F329 Major depressive disorder, single episode, unspecified: Secondary | ICD-10-CM | POA: Diagnosis not present

## 2017-11-27 DIAGNOSIS — J449 Chronic obstructive pulmonary disease, unspecified: Secondary | ICD-10-CM | POA: Diagnosis not present

## 2017-11-27 DIAGNOSIS — Z9981 Dependence on supplemental oxygen: Secondary | ICD-10-CM | POA: Diagnosis not present

## 2017-11-27 DIAGNOSIS — J9611 Chronic respiratory failure with hypoxia: Secondary | ICD-10-CM | POA: Diagnosis not present

## 2017-11-27 DIAGNOSIS — Z794 Long term (current) use of insulin: Secondary | ICD-10-CM | POA: Diagnosis not present

## 2017-11-27 DIAGNOSIS — I5033 Acute on chronic diastolic (congestive) heart failure: Secondary | ICD-10-CM | POA: Diagnosis not present

## 2017-11-27 DIAGNOSIS — R2689 Other abnormalities of gait and mobility: Secondary | ICD-10-CM | POA: Diagnosis not present

## 2017-11-27 DIAGNOSIS — E119 Type 2 diabetes mellitus without complications: Secondary | ICD-10-CM | POA: Diagnosis present

## 2017-11-27 DIAGNOSIS — E118 Type 2 diabetes mellitus with unspecified complications: Secondary | ICD-10-CM | POA: Diagnosis not present

## 2017-11-27 DIAGNOSIS — R1312 Dysphagia, oropharyngeal phase: Secondary | ICD-10-CM | POA: Diagnosis not present

## 2017-11-27 DIAGNOSIS — B20 Human immunodeficiency virus [HIV] disease: Secondary | ICD-10-CM | POA: Diagnosis not present

## 2017-11-27 DIAGNOSIS — J129 Viral pneumonia, unspecified: Secondary | ICD-10-CM | POA: Diagnosis not present

## 2017-11-27 DIAGNOSIS — E1165 Type 2 diabetes mellitus with hyperglycemia: Secondary | ICD-10-CM | POA: Diagnosis not present

## 2017-11-27 DIAGNOSIS — Z7901 Long term (current) use of anticoagulants: Secondary | ICD-10-CM | POA: Diagnosis not present

## 2017-11-27 DIAGNOSIS — Z7951 Long term (current) use of inhaled steroids: Secondary | ICD-10-CM | POA: Diagnosis not present

## 2017-11-27 DIAGNOSIS — J431 Panlobular emphysema: Secondary | ICD-10-CM | POA: Diagnosis not present

## 2017-11-27 DIAGNOSIS — Z882 Allergy status to sulfonamides status: Secondary | ICD-10-CM | POA: Diagnosis not present

## 2017-11-27 DIAGNOSIS — Z79899 Other long term (current) drug therapy: Secondary | ICD-10-CM | POA: Diagnosis not present

## 2017-11-27 DIAGNOSIS — R498 Other voice and resonance disorders: Secondary | ICD-10-CM | POA: Diagnosis not present

## 2017-11-27 DIAGNOSIS — N179 Acute kidney failure, unspecified: Secondary | ICD-10-CM | POA: Diagnosis not present

## 2017-11-27 DIAGNOSIS — M7989 Other specified soft tissue disorders: Secondary | ICD-10-CM | POA: Diagnosis not present

## 2017-11-30 ENCOUNTER — Emergency Department (HOSPITAL_COMMUNITY): Admission: EM | Admit: 2017-11-30 | Discharge: 2017-11-30 | Discharge status: Absent without leave | Payer: Medicare HMO

## 2017-11-30 ENCOUNTER — Other Ambulatory Visit: Payer: Self-pay

## 2017-11-30 NOTE — ED Notes (Signed)
Pt left without being seen. Did not want to wait.Wait time was too long said he would be back in the moring.

## 2017-11-30 NOTE — ED Notes (Signed)
Pt LWBS 

## 2017-12-01 ENCOUNTER — Telehealth: Payer: Self-pay | Admitting: *Deleted

## 2017-12-01 ENCOUNTER — Ambulatory Visit (INDEPENDENT_AMBULATORY_CARE_PROVIDER_SITE_OTHER): Payer: Medicare HMO | Admitting: Pharmacist Clinician (PhC)/ Clinical Pharmacy Specialist

## 2017-12-01 DIAGNOSIS — J9612 Chronic respiratory failure with hypercapnia: Secondary | ICD-10-CM | POA: Diagnosis not present

## 2017-12-01 DIAGNOSIS — N179 Acute kidney failure, unspecified: Secondary | ICD-10-CM | POA: Diagnosis not present

## 2017-12-01 DIAGNOSIS — J431 Panlobular emphysema: Secondary | ICD-10-CM | POA: Diagnosis not present

## 2017-12-01 DIAGNOSIS — E1165 Type 2 diabetes mellitus with hyperglycemia: Secondary | ICD-10-CM | POA: Diagnosis not present

## 2017-12-01 DIAGNOSIS — I110 Hypertensive heart disease with heart failure: Secondary | ICD-10-CM | POA: Diagnosis not present

## 2017-12-01 DIAGNOSIS — B20 Human immunodeficiency virus [HIV] disease: Secondary | ICD-10-CM

## 2017-12-01 DIAGNOSIS — I5033 Acute on chronic diastolic (congestive) heart failure: Secondary | ICD-10-CM | POA: Diagnosis not present

## 2017-12-01 DIAGNOSIS — I480 Paroxysmal atrial fibrillation: Secondary | ICD-10-CM | POA: Diagnosis not present

## 2017-12-01 DIAGNOSIS — I472 Ventricular tachycardia: Secondary | ICD-10-CM | POA: Diagnosis not present

## 2017-12-01 DIAGNOSIS — R69 Illness, unspecified: Secondary | ICD-10-CM | POA: Diagnosis not present

## 2017-12-01 DIAGNOSIS — J9621 Acute and chronic respiratory failure with hypoxia: Secondary | ICD-10-CM | POA: Diagnosis not present

## 2017-12-01 NOTE — Telephone Encounter (Signed)
Faxed medication list via EPIC. Andree CossHowell, Michelle M, RN

## 2017-12-01 NOTE — Telephone Encounter (Signed)
[facility], discharged to SNF, is now home. [place], [facility], called asking for an appointment to reconcile medications, as she is concerned that they may be incorrect after hospitalization/SNF. Patient given appointment for today at 11:30 with Pharmacy [facility]. He will bring all of his medications. Holly requests that a correct medication list be faxed to her for her records - [phone]. Andree CossHowell, Chandel Zaun M, RN

## 2017-12-01 NOTE — Progress Notes (Addendum)
HPI: Chris [name]. is a 52 y.o. male who presents to the pharmacy clinic today for a medication reconciliation visit.   Allergies: Allergies  Allergen Reactions  . Bactrim [Sulfamethoxazole-Trimethoprim] Hives    Vitals:    Past Medical History: Past Medical History:  Diagnosis Date  . Atrial fibrillation (HCC)   . COPD (chronic obstructive pulmonary disease) (HCC)    Emphysema [[name]]  . Depression   . Diabetes mellitus without complication (HCC)   . GERD (gastroesophageal reflux disease)   . HIV disease (HCC)     Social History: Social History   Socioeconomic History  . Marital status: Married    Spouse name: Not on file  . Number of children: 0  . Years of education: Not on file  . Highest education level: Not on file  Occupational History  . Occupation: Unemployed  Social Needs  . Financial resource strain: Not on file  . Food insecurity:    Worry: Not on file    Inability: Not on file  . Transportation needs:    Medical: Not on file    Non-medical: Not on file  Tobacco Use  . Smoking status: Current Some Day Smoker    Packs/day: 0.10    Years: 40.00    Pack years: 4.00    Types: Cigarettes  . Smokeless tobacco: Never Used  . Tobacco comment: smokes 3/ day since d/c from hospital  Substance and Sexual Activity  . Alcohol use: No    Alcohol/week: 0.0 oz  . Drug use: No    Comment: quit 04  . Sexual activity: Yes    Partners: Female    Birth control/protection: Condom    Comment: given condoms  Lifestyle  . Physical activity:    Days per week: Not on file    Minutes per session: Not on file  . Stress: Not on file  Relationships  . Social connections:    Talks on phone: Not on file    Gets together: Not on file    Attends religious service: Not on file    Active member of club or organization: Not on file    Attends meetings of clubs or organizations: Not on file    Relationship status: Not on file  Other Topics Concern  . Not on file   Social History Narrative  . Not on file     Labs: HIV 1 RNA Quant (copies/mL)  Date Value  10/26/2017 <20  09/25/2017 22 (H)  05/19/2017 <20 NOT DETECTED   CD4 T Cell Abs (/uL)  Date Value  10/26/2017 40 (L)  09/25/2017 430  05/19/2017 380 (L)   Hep B S Ab (no units)  Date Value  10/11/2017 Reactive   Hepatitis B Surface Ag (no units)  Date Value  10/11/2017 Negative   HCV Ab (no units)  Date Value  12/06/2012 REACTIVE (A)    CrCl: CrCl cannot be calculated (Patient's most recent lab result is older than the maximum 21 days allowed.).  Lipids:    Component Value Date/Time   CHOL 233 (H) 02/27/2015 1218   TRIG 205 (H) 10/10/2017 1856   HDL 28 (L) 02/27/2015 1218   CHOLHDL 8.3 02/27/2015 1218   VLDL 27 02/27/2015 1218   LDLCALC 178 (H) 02/27/2015 1218    Assessment: Chris [name] presents to the pharmacy clinic today for a medication reconciliation visit. Chris [name] was hospitalized at [facility] in February and March and had been at [facility] until last Friday. His medication list has  not been updated since [facility] was [facility].   Chris [name] brought the medications he has been taking with him. We reviewed these medications with him, updated his medication list in Epic and provided him with a medication calendar. Of note, he has been taking Biktarvy since he has been home as prescribed by Dr. Drue [name] in February. We will have him follow-up in May with Dr. Orvan [name].     Recommendations: - Continue taking your medications every day - F/U with Dr. Orvan [name] on 5/07 at 9:30 AM  Chris [name], PharmD, BCPS PGY2 Infectious Diseases Pharmacy Resident Pager: [phone]  12/01/2017, 12:12 PM   Agreed with Chris's note. We had to clean up his profile today. He has only been taking Eliquis qday rather than BID. Everything is printed on a med calendar for him now.   Chris [name], PharmD, BCPS, AAHIVP, CPP Infectious Disease Pharmacist Pager: [phone] 12/01/2017 4:37  PM

## 2017-12-01 NOTE — Progress Notes (Signed)
tivicay 50 daily emtricitabine 200 q 4 days TDF 300 mg q Wed  Previously on [employer]

## 2017-12-02 ENCOUNTER — Ambulatory Visit: Payer: Medicare HMO | Attending: Nurse Practitioner | Admitting: Nurse Practitioner

## 2017-12-02 ENCOUNTER — Telehealth: Payer: Self-pay

## 2017-12-02 ENCOUNTER — Encounter: Payer: Self-pay | Admitting: Nurse Practitioner

## 2017-12-02 VITALS — BP 121/67 | HR 79 | Temp 99.0°F | Ht 69.0 in | Wt 237.2 lb

## 2017-12-02 DIAGNOSIS — F17200 Nicotine dependence, unspecified, uncomplicated: Secondary | ICD-10-CM

## 2017-12-02 DIAGNOSIS — J439 Emphysema, unspecified: Secondary | ICD-10-CM | POA: Diagnosis not present

## 2017-12-02 DIAGNOSIS — Z882 Allergy status to sulfonamides status: Secondary | ICD-10-CM | POA: Insufficient documentation

## 2017-12-02 DIAGNOSIS — Z9981 Dependence on supplemental oxygen: Secondary | ICD-10-CM | POA: Insufficient documentation

## 2017-12-02 DIAGNOSIS — R69 Illness, unspecified: Secondary | ICD-10-CM | POA: Diagnosis not present

## 2017-12-02 DIAGNOSIS — M7989 Other specified soft tissue disorders: Secondary | ICD-10-CM | POA: Insufficient documentation

## 2017-12-02 DIAGNOSIS — I5032 Chronic diastolic (congestive) heart failure: Secondary | ICD-10-CM

## 2017-12-02 DIAGNOSIS — Z79899 Other long term (current) drug therapy: Secondary | ICD-10-CM | POA: Diagnosis not present

## 2017-12-02 DIAGNOSIS — G894 Chronic pain syndrome: Secondary | ICD-10-CM

## 2017-12-02 DIAGNOSIS — F329 Major depressive disorder, single episode, unspecified: Secondary | ICD-10-CM | POA: Diagnosis not present

## 2017-12-02 DIAGNOSIS — K219 Gastro-esophageal reflux disease without esophagitis: Secondary | ICD-10-CM | POA: Insufficient documentation

## 2017-12-02 DIAGNOSIS — E119 Type 2 diabetes mellitus without complications: Secondary | ICD-10-CM | POA: Diagnosis present

## 2017-12-02 DIAGNOSIS — J449 Chronic obstructive pulmonary disease, unspecified: Secondary | ICD-10-CM | POA: Diagnosis not present

## 2017-12-02 DIAGNOSIS — F17210 Nicotine dependence, cigarettes, uncomplicated: Secondary | ICD-10-CM | POA: Diagnosis not present

## 2017-12-02 DIAGNOSIS — Z21 Asymptomatic human immunodeficiency virus [HIV] infection status: Secondary | ICD-10-CM | POA: Diagnosis not present

## 2017-12-02 DIAGNOSIS — I480 Paroxysmal atrial fibrillation: Secondary | ICD-10-CM | POA: Diagnosis not present

## 2017-12-02 DIAGNOSIS — Z808 Family history of malignant neoplasm of other organs or systems: Secondary | ICD-10-CM | POA: Diagnosis not present

## 2017-12-02 DIAGNOSIS — B20 Human immunodeficiency virus [HIV] disease: Secondary | ICD-10-CM | POA: Diagnosis not present

## 2017-12-02 DIAGNOSIS — M79672 Pain in left foot: Secondary | ICD-10-CM | POA: Insufficient documentation

## 2017-12-02 DIAGNOSIS — Z7951 Long term (current) use of inhaled steroids: Secondary | ICD-10-CM | POA: Insufficient documentation

## 2017-12-02 DIAGNOSIS — Z7901 Long term (current) use of anticoagulants: Secondary | ICD-10-CM | POA: Diagnosis not present

## 2017-12-02 DIAGNOSIS — E118 Type 2 diabetes mellitus with unspecified complications: Secondary | ICD-10-CM

## 2017-12-02 DIAGNOSIS — Z794 Long term (current) use of insulin: Secondary | ICD-10-CM | POA: Diagnosis not present

## 2017-12-02 LAB — POCT GLYCOSYLATED HEMOGLOBIN (HGB A1C): Hemoglobin A1C: 5.6

## 2017-12-02 LAB — GLUCOSE, POCT (MANUAL RESULT ENTRY): POC GLUCOSE: 99 mg/dL (ref 70–99)

## 2017-12-02 MED ORDER — METFORMIN HCL 1000 MG PO TABS: 1000.0000 mg | ORAL_TABLET | Freq: Every day | ORAL | 3 refills | Status: DC

## 2017-12-02 MED ORDER — MISC. DEVICES MISC: 0 refills | Status: DC

## 2017-12-02 MED ORDER — BUSPIRONE HCL 10 MG PO TABS: 10.0000 mg | ORAL_TABLET | Freq: Three times a day (TID) | ORAL | 2 refills | Status: DC

## 2017-12-02 MED ORDER — GLUCOSE BLOOD VI STRP: ORAL_STRIP | 12 refills | Status: DC

## 2017-12-02 MED ORDER — ONETOUCH VERIO W/DEVICE KIT: 1.0000 | PACK | Freq: Two times a day (BID) | 0 refills | Status: DC

## 2017-12-02 MED ORDER — DICLOFENAC SODIUM 1 % TD GEL: 2.0000 g | Freq: Four times a day (QID) | TRANSDERMAL | 1 refills | Status: DC

## 2017-12-02 MED ORDER — INSULIN DETEMIR 100 UNIT/ML FLEXPEN: 30.0000 [IU] | PEN_INJECTOR | Freq: Every day | SUBCUTANEOUS | 11 refills | Status: DC

## 2017-12-02 MED ORDER — INSULIN PEN NEEDLE 31G X 5 MM MISC: 1 refills | Status: DC

## 2017-12-02 MED ORDER — ACETAMINOPHEN-CODEINE #3 300-30 MG PO TABS: 1.0000 | ORAL_TABLET | ORAL | 0 refills | Status: DC | PRN

## 2017-12-02 MED ORDER — ONETOUCH DELICA LANCETS 33G MISC: 3 refills | Status: DC

## 2017-12-02 MED ORDER — GABAPENTIN 100 MG PO CAPS: 100.0000 mg | ORAL_CAPSULE | Freq: Three times a day (TID) | ORAL | 3 refills | Status: DC

## 2017-12-02 NOTE — Patient Instructions (Signed)
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
## 2017-12-02 NOTE — Telephone Encounter (Signed)
Holly from [facility] called to get verbal orders to do home health for 9 weeks. Please f/u

## 2017-12-02 NOTE — Telephone Encounter (Signed)
Will route to PCP 

## 2017-12-03 DIAGNOSIS — N179 Acute kidney failure, unspecified: Secondary | ICD-10-CM | POA: Diagnosis not present

## 2017-12-03 DIAGNOSIS — J9621 Acute and chronic respiratory failure with hypoxia: Secondary | ICD-10-CM | POA: Diagnosis not present

## 2017-12-03 DIAGNOSIS — J9612 Chronic respiratory failure with hypercapnia: Secondary | ICD-10-CM | POA: Diagnosis not present

## 2017-12-03 DIAGNOSIS — I472 Ventricular tachycardia: Secondary | ICD-10-CM | POA: Diagnosis not present

## 2017-12-03 DIAGNOSIS — R69 Illness, unspecified: Secondary | ICD-10-CM | POA: Diagnosis not present

## 2017-12-03 DIAGNOSIS — J431 Panlobular emphysema: Secondary | ICD-10-CM | POA: Diagnosis not present

## 2017-12-03 DIAGNOSIS — I5033 Acute on chronic diastolic (congestive) heart failure: Secondary | ICD-10-CM | POA: Diagnosis not present

## 2017-12-03 DIAGNOSIS — I110 Hypertensive heart disease with heart failure: Secondary | ICD-10-CM | POA: Diagnosis not present

## 2017-12-03 DIAGNOSIS — E1165 Type 2 diabetes mellitus with hyperglycemia: Secondary | ICD-10-CM | POA: Diagnosis not present

## 2017-12-03 DIAGNOSIS — I480 Paroxysmal atrial fibrillation: Secondary | ICD-10-CM | POA: Diagnosis not present

## 2017-12-04 DIAGNOSIS — J9612 Chronic respiratory failure with hypercapnia: Secondary | ICD-10-CM | POA: Diagnosis not present

## 2017-12-04 DIAGNOSIS — I472 Ventricular tachycardia: Secondary | ICD-10-CM | POA: Diagnosis not present

## 2017-12-04 DIAGNOSIS — J9621 Acute and chronic respiratory failure with hypoxia: Secondary | ICD-10-CM | POA: Diagnosis not present

## 2017-12-04 DIAGNOSIS — J431 Panlobular emphysema: Secondary | ICD-10-CM | POA: Diagnosis not present

## 2017-12-04 DIAGNOSIS — E1165 Type 2 diabetes mellitus with hyperglycemia: Secondary | ICD-10-CM | POA: Diagnosis not present

## 2017-12-04 DIAGNOSIS — I480 Paroxysmal atrial fibrillation: Secondary | ICD-10-CM | POA: Diagnosis not present

## 2017-12-04 DIAGNOSIS — I5033 Acute on chronic diastolic (congestive) heart failure: Secondary | ICD-10-CM | POA: Diagnosis not present

## 2017-12-04 DIAGNOSIS — N179 Acute kidney failure, unspecified: Secondary | ICD-10-CM | POA: Diagnosis not present

## 2017-12-04 DIAGNOSIS — R69 Illness, unspecified: Secondary | ICD-10-CM | POA: Diagnosis not present

## 2017-12-04 DIAGNOSIS — I110 Hypertensive heart disease with heart failure: Secondary | ICD-10-CM | POA: Diagnosis not present

## 2017-12-04 NOTE — Telephone Encounter (Signed)
CMA called Jeanice LimHolly from [facility] to inform PCP approve verbal order.

## 2017-12-04 NOTE — Telephone Encounter (Signed)
VERBAL ORDER GIVEN. Please proceed.

## 2017-12-05 DIAGNOSIS — J431 Panlobular emphysema: Secondary | ICD-10-CM | POA: Diagnosis not present

## 2017-12-05 DIAGNOSIS — I480 Paroxysmal atrial fibrillation: Secondary | ICD-10-CM | POA: Diagnosis not present

## 2017-12-05 DIAGNOSIS — J9612 Chronic respiratory failure with hypercapnia: Secondary | ICD-10-CM | POA: Diagnosis not present

## 2017-12-05 DIAGNOSIS — J9621 Acute and chronic respiratory failure with hypoxia: Secondary | ICD-10-CM | POA: Diagnosis not present

## 2017-12-05 DIAGNOSIS — N179 Acute kidney failure, unspecified: Secondary | ICD-10-CM | POA: Diagnosis not present

## 2017-12-05 DIAGNOSIS — I472 Ventricular tachycardia: Secondary | ICD-10-CM | POA: Diagnosis not present

## 2017-12-05 DIAGNOSIS — I5033 Acute on chronic diastolic (congestive) heart failure: Secondary | ICD-10-CM | POA: Diagnosis not present

## 2017-12-05 DIAGNOSIS — E1165 Type 2 diabetes mellitus with hyperglycemia: Secondary | ICD-10-CM | POA: Diagnosis not present

## 2017-12-05 DIAGNOSIS — I110 Hypertensive heart disease with heart failure: Secondary | ICD-10-CM | POA: Diagnosis not present

## 2017-12-05 DIAGNOSIS — R69 Illness, unspecified: Secondary | ICD-10-CM | POA: Diagnosis not present

## 2017-12-07 DIAGNOSIS — I5033 Acute on chronic diastolic (congestive) heart failure: Secondary | ICD-10-CM | POA: Diagnosis not present

## 2017-12-07 DIAGNOSIS — R69 Illness, unspecified: Secondary | ICD-10-CM | POA: Diagnosis not present

## 2017-12-07 DIAGNOSIS — I480 Paroxysmal atrial fibrillation: Secondary | ICD-10-CM | POA: Diagnosis not present

## 2017-12-07 DIAGNOSIS — J9612 Chronic respiratory failure with hypercapnia: Secondary | ICD-10-CM | POA: Diagnosis not present

## 2017-12-07 DIAGNOSIS — I110 Hypertensive heart disease with heart failure: Secondary | ICD-10-CM | POA: Diagnosis not present

## 2017-12-07 DIAGNOSIS — J431 Panlobular emphysema: Secondary | ICD-10-CM | POA: Diagnosis not present

## 2017-12-07 DIAGNOSIS — J9621 Acute and chronic respiratory failure with hypoxia: Secondary | ICD-10-CM | POA: Diagnosis not present

## 2017-12-07 DIAGNOSIS — N179 Acute kidney failure, unspecified: Secondary | ICD-10-CM | POA: Diagnosis not present

## 2017-12-07 DIAGNOSIS — I472 Ventricular tachycardia: Secondary | ICD-10-CM | POA: Diagnosis not present

## 2017-12-07 DIAGNOSIS — E1165 Type 2 diabetes mellitus with hyperglycemia: Secondary | ICD-10-CM | POA: Diagnosis not present

## 2017-12-07 LAB — TOXASSURE SELECT 13 (MW), URINE

## 2017-12-09 ENCOUNTER — Encounter: Payer: Self-pay | Admitting: Acute Care

## 2017-12-09 ENCOUNTER — Ambulatory Visit (INDEPENDENT_AMBULATORY_CARE_PROVIDER_SITE_OTHER): Payer: Medicare HMO | Admitting: Acute Care

## 2017-12-09 ENCOUNTER — Other Ambulatory Visit: Payer: Self-pay | Admitting: Nurse Practitioner

## 2017-12-09 ENCOUNTER — Telehealth: Payer: Self-pay | Admitting: Nurse Practitioner

## 2017-12-09 VITALS — BP 138/80 | HR 78 | Ht 69.0 in | Wt 244.8 lb

## 2017-12-09 DIAGNOSIS — R69 Illness, unspecified: Secondary | ICD-10-CM | POA: Diagnosis not present

## 2017-12-09 DIAGNOSIS — F17210 Nicotine dependence, cigarettes, uncomplicated: Secondary | ICD-10-CM | POA: Diagnosis not present

## 2017-12-09 DIAGNOSIS — J1100 Influenza due to unidentified influenza virus with unspecified type of pneumonia: Secondary | ICD-10-CM | POA: Diagnosis not present

## 2017-12-09 DIAGNOSIS — B370 Candidal stomatitis: Secondary | ICD-10-CM | POA: Diagnosis not present

## 2017-12-09 DIAGNOSIS — IMO0001 Reserved for inherently not codable concepts without codable children: Secondary | ICD-10-CM

## 2017-12-09 DIAGNOSIS — Z794 Long term (current) use of insulin: Secondary | ICD-10-CM

## 2017-12-09 DIAGNOSIS — E118 Type 2 diabetes mellitus with unspecified complications: Secondary | ICD-10-CM

## 2017-12-09 DIAGNOSIS — J9611 Chronic respiratory failure with hypoxia: Secondary | ICD-10-CM | POA: Diagnosis not present

## 2017-12-09 DIAGNOSIS — J449 Chronic obstructive pulmonary disease, unspecified: Secondary | ICD-10-CM

## 2017-12-09 DIAGNOSIS — J9612 Chronic respiratory failure with hypercapnia: Secondary | ICD-10-CM | POA: Diagnosis not present

## 2017-12-09 MED ORDER — BUDESONIDE-FORMOTEROL FUMARATE 160-4.5 MCG/ACT IN AERO: 2.0000 | INHALATION_SPRAY | Freq: Two times a day (BID) | RESPIRATORY_TRACT | 0 refills | Status: DC

## 2017-12-09 MED ORDER — NYSTATIN 100000 UNIT/ML MT SUSP: OROMUCOSAL | 0 refills | Status: DC

## 2017-12-09 MED ORDER — GABAPENTIN 300 MG PO CAPS: 300.0000 mg | ORAL_CAPSULE | Freq: Three times a day (TID) | ORAL | 1 refills | Status: DC

## 2017-12-09 MED ORDER — UMECLIDINIUM BROMIDE 62.5 MCG/INH IN AEPB: 1.0000 | INHALATION_SPRAY | Freq: Every day | RESPIRATORY_TRACT | 5 refills | Status: DC

## 2017-12-09 MED ORDER — AMITRIPTYLINE HCL 100 MG PO TABS: 100.0000 mg | ORAL_TABLET | Freq: Every day | ORAL | 2 refills | Status: DC

## 2017-12-09 NOTE — Telephone Encounter (Signed)
Please call patient. Pain management referral is still pending. It could take several weeks. Pain medication (tylenol 3)  from primary care can only be dispensed for 30 days. The medication should be taken sparingly. I will not be able to refill for several weeks. I will send in an alternate medication for him to try and will increase his gabapentin.

## 2017-12-09 NOTE — Progress Notes (Signed)
History of Present Illness Chris [name] Chris [name]. is a 52 y.o. male current some day smoker with Gold III COPD, Depression,  HIV, A fib ( Eliquis) , CHF, and DM Type 2 . He is followed by [name]. Maintenance Therapy:  Symbicort Spiriva   History of present illness:  52 yo BM PMHxDepression HIV, A fib, COPD, and DM Type 2 . He was admitted to [facility] form 10/01/2017-10/30/2017. He Presented with dyspnea, cough, fever, and hypoxia and was found to have Influenza PNA causing ARDS. His hospital course was  complicated by hypervolemia and renal failure. During his hospitalization patient was treated for acute respiratory failure with hypoxia and hypercapnia secondary to influenza pneumonia which resulted in ARDS.  Compounded by COPD/emphysema and A. fib with RVR, and esophogeal candiditis. He was treated with antibiotics, steroids and scheduled BD's.   Unfortunately patient had failure to wean which resulted in tracheostomy.   Most likely exacerbated by his acute renal failure resulting in hemodialysis.  Initially patient refused hemodialysis however since being convinced to go to HD his renal status and respiratory status has improved significantly.  Although patient has not weaned fully from vent, he  has improved significantly.  He was deemed stable for discharge.  Patient understands will be a prolonged weaning process. He was discharged to an LTAC with plans for continued weaning and inline PMV as tolerated. He was started on Buspar per his PCP on 4/24.   12/09/2017 Hospital Follow Up: Pt. Presents today for follow up.  Pt states he has been doing well. He is compliant with his Symbicort and Spiriva.He was decannulated 1 month ago. At that time he had weaned himself off his vent. He is no longer hemodialysis dependent. He is getting PT twice weekly as he is deconditioned. He states the [facility] he was on did not work. He states he has better results with Symbicort. His Insurance has stopped paying for Spiriva.  We will start the preferred Incruse.He states he has not needed his oxygen much. He is wearing it as needed at 2 L , usually with exertion. .He has cut down to 4 cigarettes daily.We discussed the need to quit totally. He states he does have some secretions, they are clear to white. He denies fever, chest pain, orthopnea or hemoptysis.     Test Results: STUDIES:  Echo 2/25 >> EF 60 to 65%, mild MR Renal u/s 2/28 >> slight fullness of renal collecting system  CULTURES: Respiratory viral panel 2/21 >> Influenza A H3 Blood 2/21 >> negative Sputum 2/22 >> oral flora Urine 2/26 >> negative Sputum 2/26 >> oral flora Blood 2/27 >> negative Sputum 3/5>>> oral flora Blood 3/6>>> negative  ANTIBIOTICS: Vancomycin 2/21 >>2/22 Cefepime 2/21 >> 2/28 Tamiflu 2/21 >> 2/26 Fluconazole 2/21 >> 2/27 Anidulafungin 2/27 >>3/6 Zithromax 2/27 >> 3/1  SIGNIFICANT EVENTS: 2/21 Admit 2/22 VDRF 2/27 ID consulted for persistent fever 2/28 Renal consult for AKI, Cardiology consult for A fib with RVR 3/01 Trach 3/03 Attempted HD 3/04 Changed to CRRT 3/05 Worsening hypoxia >> vent settings adjusted 3/12 CRRT stopped  3/13 first iHD; PSV x 8 hrs  LINES/TUBES: ETT 2/22 >> 3/1 Left IJ 2/27 >>> Trach 3/01 >>> R IJ HD cath 3/03 >>>       CBC Latest Ref Rng & Units 10/30/2017 10/29/2017 10/28/2017  WBC 4.0 - 10.5 K/uL 7.3 6.3 8.3  Hemoglobin 13.0 - 17.0 g/dL 7.9(L) 8.3(L) 7.9(L)  Hematocrit 39.0 - 52.0 % 25.2(L) 26.2(L) 25.0(L)  Platelets 150 -  400 K/uL 293 295 344    BMP Latest Ref Rng & Units 10/30/2017 10/29/2017 10/28/2017  Glucose 65 - 99 mg/dL 222(H) 236(H) 174(H)  BUN 6 - 20 mg/dL 117(H) 85(H) 99(H)  Creatinine 0.61 - 1.24 mg/dL 5.44(H) 4.85(H) 5.56(H)  BUN/Creat Ratio 6 - 22 (calc) - - -  Sodium 135 - 145 mmol/L 132(L) 131(L) 130(L)  Potassium 3.5 - 5.1 mmol/L 4.5 4.3 4.5  Chloride 101 - 111 mmol/L 89(L) 89(L) 90(L)  CO2 22 - 32 mmol/L '25 27 26  ' Calcium 8.9 - 10.3 mg/dL 8.8(L)  8.8(L) 8.7(L)    BNP    Component Value Date/Time   BNP 46.8 10/04/2017 0226    ProBNP    Component Value Date/Time   PROBNP 43.1 12/12/2013 1006    PFT    Component Value Date/Time   FEV1PRE 0.90 06/17/2016 1155   FEV1POST 1.29 06/17/2016 1155   FVCPRE 1.60 06/17/2016 1155   FVCPOST 2.01 06/17/2016 1155   TLC 5.30 06/17/2016 1155   DLCOUNC 14.46 06/17/2016 1155   PREFEV1FVCRT 56 06/17/2016 1155   PSTFEV1FVCRT 64 06/17/2016 1155    No results found.   Past medical hx Past Medical History:  Diagnosis Date  . Anxiety   . Atrial fibrillation ([place])   . Atrial fibrillation ([place])   . CHF (congestive heart failure) ([place])   . COPD (chronic obstructive pulmonary disease) ([place])    Emphysema [[name]]  . Depression   . Diabetes mellitus without complication ([place])   . Emphysema of lung ([place])   . GERD (gastroesophageal reflux disease)   . HIV disease ([place])   . Hypertension   . Oxygen deficiency      Social History   Tobacco Use  . Smoking status: Current Some Day Smoker    Packs/day: 0.10    Years: 40.00    Pack years: 4.00    Types: Cigarettes  . Smokeless tobacco: Never Used  . Tobacco comment: smokes 3/ day since d/c from hospital  Substance Use Topics  . Alcohol use: No    Alcohol/week: 0.0 oz  . Drug use: No    Comment: quit 04    Chris [name] reports that he has been smoking cigarettes.  He has a 4.00 pack-year smoking history. He has never used smokeless tobacco. He reports that he does not drink alcohol or use drugs.  Tobacco Cessation: Current every day smoker Has cut to 4 cigarettes daily He wants to quit  Past surgical hx, Family hx, Social hx all reviewed.  Current Outpatient Medications on File Prior to Visit  Medication Sig  . acetaminophen-codeine (TYLENOL #3) 300-30 MG tablet Take 1 tablet by mouth every 4 (four) hours as needed for moderate pain.  [name] apixaban (ELIQUIS) 5 MG TABS tablet Take 5 mg by mouth 2 (two) times daily.  .  bictegravir-emtricitabine-tenofovir AF (BIKTARVY) 50-200-25 MG TABS tablet Take 1 tablet by mouth daily.  . Blood Glucose Monitoring Suppl (ONETOUCH VERIO) w/Device KIT 1 kit by Does not apply route 2 (two) times daily.  . busPIRone (BUSPAR) 10 MG tablet Take 1 tablet (10 mg total) by mouth 3 (three) times daily.  . clotrimazole (MYCELEX) 10 MG troche Take 10 mg by mouth 5 (five) times daily.  . diclofenac sodium (VOLTAREN) 1 % GEL Apply 2 g topically 4 (four) times daily.  [name] diltiazem (CARDIZEM CD) 360 MG 24 hr capsule Take 360 mg by mouth daily.  . furosemide (LASIX) 20 MG tablet Take 20 mg by mouth daily.  [name]  gabapentin (NEURONTIN) 100 MG capsule Take 1 capsule (100 mg total) by mouth 3 (three) times daily.  [name] glucose blood (ONETOUCH VERIO) test strip Use as instructed  . Insulin Detemir (LEVEMIR) 100 UNIT/ML Pen Inject 30 Units into the skin daily at 10 pm.  . Insulin Pen Needle (B-D UF III MINI PEN NEEDLES) 31G X 5 MM MISC Use as instructed  . metFORMIN (GLUCOPHAGE) 1000 MG tablet Take 1 tablet (1,000 mg total) by mouth daily with breakfast.  . Misc. Devices MISC Please provide patient with a portable oximeter. Diagnosis: COPD GOLD III  . mometasone-formoterol (DULERA) 200-5 MCG/ACT AERO Inhale 2 puffs into the lungs 2 (two) times daily.  Glory Rosebush DELICA LANCETS 75O MISC Use as instructed  . terbinafine (LAMISIL) 250 MG tablet Take 250 mg by mouth daily.  . Tiotropium Bromide Monohydrate (SPIRIVA RESPIMAT) 2.5 MCG/ACT AERS Inhale 1 puff into the lungs every morning.  . [DISCONTINUED] insulin glargine (LANTUS) 100 UNIT/ML injection Inject 0.2 mLs (20 Units total) into the skin 2 (two) times daily. (Patient taking differently: Inject 30 Units into the skin daily. )   No current facility-administered medications on file prior to visit.      Allergies  Allergen Reactions  . Bactrim [Sulfamethoxazole-Trimethoprim] Hives    Review Of Systems:  Constitutional:   No  weight loss, night  sweats,  Fevers, chills, + fatigue, or  lassitude.  HEENT:   No headaches,  Difficulty swallowing,  Tooth/dental problems, or  Sore throat,                No sneezing, itching, ear ache, nasal congestion, post nasal drip,   CV:  No chest pain,  Orthopnea, PND, swelling in lower extremities, anasarca, dizziness, palpitations, syncope.   GI  No heartburn, indigestion, abdominal pain, nausea, vomiting, diarrhea, change in bowel habits, loss of appetite, bloody stools.   Resp: + shortness of breath with exertion less at rest.  + baseline  excess mucus, + baseline  productive cough,  No non-productive cough,  No coughing up of blood.  No change in color of mucus.  + wheezing.  No chest wall deformity  Skin: no rash or lesions.  GU: no dysuria, change in color of urine, no urgency or frequency.  No flank pain, no hematuria   MS:  No joint pain or swelling.  No decreased range of motion.  No back pain.  Psych:  No change in mood or affect. No depression or anxiety.  No memory loss.   Vital Signs BP 138/80 (BP Location: Left Arm, Cuff Size: Normal)   Pulse 78   Ht '5\' 9"'  (1.753 m)   Wt 244 lb 12.8 oz (111 kg)   SpO2 94%   BMI 36.15 kg/m    Physical Exam:  General- No distress,  A&Ox3, pleasant ENT: No sinus tenderness, TM clear, pale nasal mucosa, no oral exudate,no post nasal drip, no LAN, + thrush, Healed trach stoma Cardiac: S1, S2, regular rate and rhythm, no murmur Chest: + Exp wheeze/No  rales/ dullness; no accessory muscle use, no nasal flaring, no sternal retractions Abd.: Soft Non-tender, ND, obese Ext: No clubbing cyanosis, trace BLE edema Neuro:  Deconditioned 2/2 critical illness Skin: No rashes, warm and dry Psych: normal mood and behavior   Assessment/Plan  Chronic respiratory failure with hypoxia and hypercapnia (HCC) Continue wearing oxygen at 2 L  Oxygen saturation goal is 88-92% Wean as able using sats a guide.  Influenza, pneumonia Requiring long  hospitalization ( 4  weeks) Requiring intubation/ vent and trach Discharged to [place] has been decannulated/ healing stoma Oxygen per [place] prn Plan: CXR today We will call you with results We will continue Symbicort 2 puffs twice daily Rinse mouth after use. Start Incruse after you have run out of Spiriva Incruse 1 puff once daily We will send in a prescription. Nystatin mouthwash 5 cc's 4 times a day x 7 days. Continue your neb treatments as you have been doing. We will send in a prescription. Congratulations of cutting down to 4 cigarettes a day. Continue working on quitting smoking completely Follow up with [name] 12/29/2017 as is scheduled. Please contact office for sooner follow up if symptoms do not improve or worsen or seek emergency care  Note your daily symptoms > remember "red flags" for COPD:  Increase in cough, increase in sputum production, increase in shortness of breath or activity tolerance. If you notice these symptoms, please call to be seen.       Candidiasis of mouth Nystatin mouthwash 5 cc 4 times daily x 10 days  Cigarette smoker Hs reduced to 4 cigarettes daily. Wants to quit Actively working on cutting down I have spent 3 minutes counseling patient on smoking cessation this visit. I have told him to let us know if we can help.    Magdalen Spatz, NP 12/09/2017  6:35 PM

## 2017-12-09 NOTE — Patient Instructions (Addendum)
It is good to see you today. CXR today We will call you with results We will continue Symbicort 2 puffs twice daily Rinse mouth after use. Start Incruse after you have run out of Spiriva Incruse 1 puff once daily We will send in a prescription. Nystatin mouthwash 5 cc's 4 times a day x 7 days. Continue your neb treatments as you have been doing. We will send in a prescription. Congratulations of cutting down to 4 cigarettes a day. Continue working on quitting smoking completely Follow up with Dr. Sherene Sires 12/29/2017 as is scheduled. Please contact office for sooner follow up if symptoms do not improve or worsen or seek emergency care  Note your daily symptoms > remember "red flags" for COPD:  Increase in cough, increase in sputum production, increase in shortness of breath or activity tolerance. If you notice these symptoms, please call to be seen.

## 2017-12-09 NOTE — Assessment & Plan Note (Signed)
Continue wearing oxygen at 2 L  Oxygen saturation goal is 88-92% Wean as able using sats a guide.

## 2017-12-09 NOTE — Assessment & Plan Note (Signed)
Nystatin mouthwash 5 cc 4 times daily x 10 days

## 2017-12-09 NOTE — Telephone Encounter (Signed)
Will route to PCP 

## 2017-12-09 NOTE — Telephone Encounter (Signed)
Melton Alar from [facility] called to check up on patient referral at the pain clinic. Melton Alar had a home visit with patient today and patient only has 1 tablet of pain medication left. Please fu at your earliest convenience.

## 2017-12-09 NOTE — Assessment & Plan Note (Signed)
Requiring long hospitalization ( 4 weeks) Requiring intubation/ vent and trach Discharged to LTAC Trach has been decannulated/ healing stoma Oxygen per [place] prn Plan: CXR today We will call you with results We will continue Symbicort 2 puffs twice daily Rinse mouth after use. Start Incruse after you have run out of Spiriva Incruse 1 puff once daily We will send in a prescription. Nystatin mouthwash 5 cc's 4 times a day x 7 days. Continue your neb treatments as you have been doing. We will send in a prescription. Congratulations of cutting down to 4 cigarettes a day. Continue working on quitting smoking completely Follow up with Dr. Sherene Sires 12/29/2017 as is scheduled. Please contact office for sooner follow up if symptoms do not improve or worsen or seek emergency care  Note your daily symptoms > remember "red flags" for COPD:  Increase in cough, increase in sputum production, increase in shortness of breath or activity tolerance. If you notice these symptoms, please call to be seen.

## 2017-12-09 NOTE — Assessment & Plan Note (Signed)
Hs reduced to 4 cigarettes daily. Wants to quit Actively working on cutting down I have spent 3 minutes counseling patient on smoking cessation this visit. I have told him to let us know if we can help.

## 2017-12-10 NOTE — Telephone Encounter (Signed)
Sent referral to [facility] Today 12/10/17 Ph. # [phone] . They will contact the patient to schedule an appointment in [place] or [place] location

## 2017-12-11 ENCOUNTER — Other Ambulatory Visit: Payer: Self-pay | Admitting: Nurse Practitioner

## 2017-12-11 DIAGNOSIS — G8929 Other chronic pain: Secondary | ICD-10-CM | POA: Diagnosis not present

## 2017-12-11 DIAGNOSIS — Z79899 Other long term (current) drug therapy: Secondary | ICD-10-CM | POA: Diagnosis not present

## 2017-12-11 DIAGNOSIS — M545 Low back pain: Secondary | ICD-10-CM | POA: Diagnosis not present

## 2017-12-11 DIAGNOSIS — N179 Acute kidney failure, unspecified: Secondary | ICD-10-CM

## 2017-12-11 DIAGNOSIS — G8921 Chronic pain due to trauma: Secondary | ICD-10-CM | POA: Diagnosis not present

## 2017-12-11 MED ORDER — APIXABAN 5 MG PO TABS
5.0000 mg | ORAL_TABLET | Freq: Two times a day (BID) | ORAL | 1 refills | Status: DC
Start: 2017-12-11 — End: 2018-03-02

## 2017-12-11 NOTE — Telephone Encounter (Signed)
CMA spoke to patient to inform what PCP advised.  Patient understood and is aware that PCP had sent him alternate medicine. Patient stated he have a pain management appt. Today at 2:00. Patient request if he can have Eliquis refill.

## 2017-12-14 ENCOUNTER — Encounter (HOSPITAL_COMMUNITY): Payer: Self-pay | Admitting: Emergency Medicine

## 2017-12-14 ENCOUNTER — Other Ambulatory Visit: Payer: Self-pay

## 2017-12-14 ENCOUNTER — Emergency Department (HOSPITAL_COMMUNITY): Payer: Medicare HMO

## 2017-12-14 ENCOUNTER — Inpatient Hospital Stay (HOSPITAL_COMMUNITY)
Admission: EM | Admit: 2017-12-14 | Discharge: 2017-12-29 | DRG: 207 | Disposition: A | Discharge status: Skilled Nursing Facility | Payer: Medicare HMO | Attending: Internal Medicine | Admitting: Internal Medicine

## 2017-12-14 DIAGNOSIS — F329 Major depressive disorder, single episode, unspecified: Secondary | ICD-10-CM | POA: Diagnosis present

## 2017-12-14 DIAGNOSIS — I480 Paroxysmal atrial fibrillation: Secondary | ICD-10-CM | POA: Diagnosis not present

## 2017-12-14 DIAGNOSIS — Z7401 Bed confinement status: Secondary | ICD-10-CM | POA: Diagnosis not present

## 2017-12-14 DIAGNOSIS — D631 Anemia in chronic kidney disease: Secondary | ICD-10-CM | POA: Diagnosis present

## 2017-12-14 DIAGNOSIS — T380X5A Adverse effect of glucocorticoids and synthetic analogues, initial encounter: Secondary | ICD-10-CM | POA: Diagnosis present

## 2017-12-14 DIAGNOSIS — R0902 Hypoxemia: Secondary | ICD-10-CM | POA: Diagnosis not present

## 2017-12-14 DIAGNOSIS — E1142 Type 2 diabetes mellitus with diabetic polyneuropathy: Secondary | ICD-10-CM | POA: Diagnosis present

## 2017-12-14 DIAGNOSIS — R69 Illness, unspecified: Secondary | ICD-10-CM | POA: Diagnosis not present

## 2017-12-14 DIAGNOSIS — Z9119 Patient's noncompliance with other medical treatment and regimen: Secondary | ICD-10-CM | POA: Diagnosis not present

## 2017-12-14 DIAGNOSIS — E875 Hyperkalemia: Secondary | ICD-10-CM | POA: Diagnosis not present

## 2017-12-14 DIAGNOSIS — B379 Candidiasis, unspecified: Secondary | ICD-10-CM | POA: Diagnosis not present

## 2017-12-14 DIAGNOSIS — R41841 Cognitive communication deficit: Secondary | ICD-10-CM | POA: Diagnosis not present

## 2017-12-14 DIAGNOSIS — I10 Essential (primary) hypertension: Secondary | ICD-10-CM | POA: Diagnosis present

## 2017-12-14 DIAGNOSIS — J129 Viral pneumonia, unspecified: Secondary | ICD-10-CM | POA: Diagnosis not present

## 2017-12-14 DIAGNOSIS — M6281 Muscle weakness (generalized): Secondary | ICD-10-CM | POA: Diagnosis not present

## 2017-12-14 DIAGNOSIS — Z836 Family history of other diseases of the respiratory system: Secondary | ICD-10-CM | POA: Diagnosis not present

## 2017-12-14 DIAGNOSIS — F17210 Nicotine dependence, cigarettes, uncomplicated: Secondary | ICD-10-CM | POA: Diagnosis present

## 2017-12-14 DIAGNOSIS — J9620 Acute and chronic respiratory failure, unspecified whether with hypoxia or hypercapnia: Secondary | ICD-10-CM | POA: Diagnosis not present

## 2017-12-14 DIAGNOSIS — E1169 Type 2 diabetes mellitus with other specified complication: Secondary | ICD-10-CM | POA: Diagnosis present

## 2017-12-14 DIAGNOSIS — R498 Other voice and resonance disorders: Secondary | ICD-10-CM | POA: Diagnosis not present

## 2017-12-14 DIAGNOSIS — J9621 Acute and chronic respiratory failure with hypoxia: Secondary | ICD-10-CM | POA: Diagnosis present

## 2017-12-14 DIAGNOSIS — Z4659 Encounter for fitting and adjustment of other gastrointestinal appliance and device: Secondary | ICD-10-CM

## 2017-12-14 DIAGNOSIS — R0602 Shortness of breath: Secondary | ICD-10-CM

## 2017-12-14 DIAGNOSIS — E1122 Type 2 diabetes mellitus with diabetic chronic kidney disease: Secondary | ICD-10-CM | POA: Diagnosis present

## 2017-12-14 DIAGNOSIS — E872 Acidosis: Secondary | ICD-10-CM | POA: Diagnosis present

## 2017-12-14 DIAGNOSIS — G9340 Encephalopathy, unspecified: Secondary | ICD-10-CM | POA: Diagnosis not present

## 2017-12-14 DIAGNOSIS — N182 Chronic kidney disease, stage 2 (mild): Secondary | ICD-10-CM | POA: Diagnosis not present

## 2017-12-14 DIAGNOSIS — R1312 Dysphagia, oropharyngeal phase: Secondary | ICD-10-CM | POA: Diagnosis not present

## 2017-12-14 DIAGNOSIS — I130 Hypertensive heart and chronic kidney disease with heart failure and stage 1 through stage 4 chronic kidney disease, or unspecified chronic kidney disease: Secondary | ICD-10-CM | POA: Diagnosis not present

## 2017-12-14 DIAGNOSIS — J449 Chronic obstructive pulmonary disease, unspecified: Secondary | ICD-10-CM | POA: Diagnosis not present

## 2017-12-14 DIAGNOSIS — N179 Acute kidney failure, unspecified: Secondary | ICD-10-CM | POA: Diagnosis not present

## 2017-12-14 DIAGNOSIS — J441 Chronic obstructive pulmonary disease with (acute) exacerbation: Principal | ICD-10-CM

## 2017-12-14 DIAGNOSIS — Z4682 Encounter for fitting and adjustment of non-vascular catheter: Secondary | ICD-10-CM | POA: Diagnosis not present

## 2017-12-14 DIAGNOSIS — Z978 Presence of other specified devices: Secondary | ICD-10-CM

## 2017-12-14 DIAGNOSIS — Z9289 Personal history of other medical treatment: Secondary | ICD-10-CM

## 2017-12-14 DIAGNOSIS — R000 Tachycardia, unspecified: Secondary | ICD-10-CM | POA: Diagnosis not present

## 2017-12-14 DIAGNOSIS — J9622 Acute and chronic respiratory failure with hypercapnia: Secondary | ICD-10-CM | POA: Diagnosis not present

## 2017-12-14 DIAGNOSIS — B20 Human immunodeficiency virus [HIV] disease: Secondary | ICD-10-CM | POA: Diagnosis present

## 2017-12-14 DIAGNOSIS — R061 Stridor: Secondary | ICD-10-CM | POA: Diagnosis not present

## 2017-12-14 DIAGNOSIS — Z9911 Dependence on respirator [ventilator] status: Secondary | ICD-10-CM | POA: Diagnosis not present

## 2017-12-14 DIAGNOSIS — M2550 Pain in unspecified joint: Secondary | ICD-10-CM | POA: Diagnosis not present

## 2017-12-14 DIAGNOSIS — K219 Gastro-esophageal reflux disease without esophagitis: Secondary | ICD-10-CM | POA: Diagnosis not present

## 2017-12-14 DIAGNOSIS — E8729 Other acidosis: Secondary | ICD-10-CM

## 2017-12-14 DIAGNOSIS — G4733 Obstructive sleep apnea (adult) (pediatric): Secondary | ICD-10-CM | POA: Diagnosis present

## 2017-12-14 DIAGNOSIS — F419 Anxiety disorder, unspecified: Secondary | ICD-10-CM | POA: Diagnosis present

## 2017-12-14 DIAGNOSIS — Z800 Family history of malignant neoplasm of digestive organs: Secondary | ICD-10-CM | POA: Diagnosis not present

## 2017-12-14 DIAGNOSIS — R0603 Acute respiratory distress: Secondary | ICD-10-CM

## 2017-12-14 DIAGNOSIS — B370 Candidal stomatitis: Secondary | ICD-10-CM | POA: Diagnosis not present

## 2017-12-14 DIAGNOSIS — Z833 Family history of diabetes mellitus: Secondary | ICD-10-CM | POA: Diagnosis not present

## 2017-12-14 DIAGNOSIS — I5033 Acute on chronic diastolic (congestive) heart failure: Secondary | ICD-10-CM | POA: Diagnosis present

## 2017-12-14 DIAGNOSIS — Z6833 Body mass index (BMI) 33.0-33.9, adult: Secondary | ICD-10-CM

## 2017-12-14 DIAGNOSIS — I4891 Unspecified atrial fibrillation: Secondary | ICD-10-CM | POA: Diagnosis not present

## 2017-12-14 DIAGNOSIS — R140 Abdominal distension (gaseous): Secondary | ICD-10-CM | POA: Diagnosis not present

## 2017-12-14 DIAGNOSIS — R1310 Dysphagia, unspecified: Secondary | ICD-10-CM | POA: Diagnosis not present

## 2017-12-14 DIAGNOSIS — E669 Obesity, unspecified: Secondary | ICD-10-CM

## 2017-12-14 DIAGNOSIS — E1165 Type 2 diabetes mellitus with hyperglycemia: Secondary | ICD-10-CM | POA: Diagnosis present

## 2017-12-14 DIAGNOSIS — R2689 Other abnormalities of gait and mobility: Secondary | ICD-10-CM | POA: Diagnosis not present

## 2017-12-14 DIAGNOSIS — B371 Pulmonary candidiasis: Secondary | ICD-10-CM | POA: Diagnosis present

## 2017-12-14 DIAGNOSIS — Z01818 Encounter for other preprocedural examination: Secondary | ICD-10-CM

## 2017-12-14 LAB — COMPREHENSIVE METABOLIC PANEL
ALK PHOS: 46 U/L (ref 38–126)
ALT: 20 U/L (ref 17–63)
AST: 21 U/L (ref 15–41)
Albumin: 3.4 g/dL — ABNORMAL LOW (ref 3.5–5.0)
Anion gap: 9 (ref 5–15)
BUN: 12 mg/dL (ref 6–20)
CALCIUM: 9 mg/dL (ref 8.9–10.3)
CHLORIDE: 103 mmol/L (ref 101–111)
CO2: 27 mmol/L (ref 22–32)
CREATININE: 1.29 mg/dL — AB (ref 0.61–1.24)
Glucose, Bld: 131 mg/dL — ABNORMAL HIGH (ref 65–99)
Potassium: 4 mmol/L (ref 3.5–5.1)
Sodium: 139 mmol/L (ref 135–145)
Total Bilirubin: 0.3 mg/dL (ref 0.3–1.2)
Total Protein: 7.7 g/dL (ref 6.5–8.1)

## 2017-12-14 LAB — BLOOD GAS, ARTERIAL
Acid-base deficit: 3.2 mmol/L — ABNORMAL HIGH (ref 0.0–2.0)
Bicarbonate: 22.7 mmol/L (ref 20.0–28.0)
DRAWN BY: 345601
O2 Content: 6 L/min
O2 Saturation: 93.8 %
PH ART: 7.278 — AB (ref 7.350–7.450)
Patient temperature: 97.4
pCO2 arterial: 49.7 mmHg — ABNORMAL HIGH (ref 32.0–48.0)
pO2, Arterial: 72.3 mmHg — ABNORMAL LOW (ref 83.0–108.0)

## 2017-12-14 LAB — I-STAT CHEM 8, ED
BUN: 14 mg/dL (ref 6–20)
CALCIUM ION: 1.16 mmol/L (ref 1.15–1.40)
Chloride: 103 mmol/L (ref 101–111)
Creatinine, Ser: 1.2 mg/dL (ref 0.61–1.24)
GLUCOSE: 129 mg/dL — AB (ref 65–99)
HCT: 37 % — ABNORMAL LOW (ref 39.0–52.0)
Hemoglobin: 12.6 g/dL — ABNORMAL LOW (ref 13.0–17.0)
Potassium: 3.9 mmol/L (ref 3.5–5.1)
SODIUM: 141 mmol/L (ref 135–145)
TCO2: 27 mmol/L (ref 22–32)

## 2017-12-14 LAB — I-STAT ARTERIAL BLOOD GAS, ED
ACID-BASE DEFICIT: 1 mmol/L (ref 0.0–2.0)
Bicarbonate: 26.8 mmol/L (ref 20.0–28.0)
O2 Saturation: 92 %
PH ART: 7.274 — AB (ref 7.350–7.450)
TCO2: 28 mmol/L (ref 22–32)
pCO2 arterial: 57.9 mmHg — ABNORMAL HIGH (ref 32.0–48.0)
pO2, Arterial: 74 mmHg — ABNORMAL LOW (ref 83.0–108.0)

## 2017-12-14 LAB — CBC WITH DIFFERENTIAL/PLATELET
Basophils Absolute: 0 10*3/uL (ref 0.0–0.1)
Basophils Relative: 1 %
EOS ABS: 0.2 10*3/uL (ref 0.0–0.7)
EOS PCT: 3 %
HCT: 37.1 % — ABNORMAL LOW (ref 39.0–52.0)
Hemoglobin: 11.6 g/dL — ABNORMAL LOW (ref 13.0–17.0)
LYMPHS ABS: 1.8 10*3/uL (ref 0.7–4.0)
Lymphocytes Relative: 20 %
MCH: 29.7 pg (ref 26.0–34.0)
MCHC: 31.3 g/dL (ref 30.0–36.0)
MCV: 94.9 fL (ref 78.0–100.0)
MONO ABS: 0.5 10*3/uL (ref 0.1–1.0)
MONOS PCT: 6 %
NEUTROS PCT: 70 %
Neutro Abs: 6.3 10*3/uL (ref 1.7–7.7)
Platelets: 259 10*3/uL (ref 150–400)
RBC: 3.91 MIL/uL — ABNORMAL LOW (ref 4.22–5.81)
RDW: 14.8 % (ref 11.5–15.5)
WBC: 8.9 10*3/uL (ref 4.0–10.5)

## 2017-12-14 LAB — BRAIN NATRIURETIC PEPTIDE: B NATRIURETIC PEPTIDE 5: 73.6 pg/mL (ref 0.0–100.0)

## 2017-12-14 LAB — I-STAT TROPONIN, ED: TROPONIN I, POC: 0 ng/mL (ref 0.00–0.08)

## 2017-12-14 LAB — GLUCOSE, CAPILLARY: GLUCOSE-CAPILLARY: 302 mg/dL — AB (ref 65–99)

## 2017-12-14 MED ORDER — ACETAMINOPHEN 650 MG RE SUPP: 650.0000 mg | Freq: Four times a day (QID) | RECTAL | Status: DC | PRN

## 2017-12-14 MED ORDER — INSULIN DETEMIR 100 UNIT/ML FLEXPEN: 30.0000 [IU] | PEN_INJECTOR | Freq: Every day | SUBCUTANEOUS | Status: DC

## 2017-12-14 MED ORDER — IPRATROPIUM BROMIDE 0.02 % IN SOLN
0.5000 mg | Freq: Four times a day (QID) | RESPIRATORY_TRACT | Status: DC
  Administered 2017-12-14 – 2017-12-22 (×33): 0.5 mg via RESPIRATORY_TRACT
  Filled 2017-12-14 (×34): qty 2.5

## 2017-12-14 MED ORDER — INSULIN ASPART 100 UNIT/ML ~~LOC~~ SOLN
0.0000 [IU] | SUBCUTANEOUS | Status: DC
  Administered 2017-12-14: 11 [IU] via SUBCUTANEOUS
  Administered 2017-12-15 (×2): 3 [IU] via SUBCUTANEOUS
  Administered 2017-12-15: 8 [IU] via SUBCUTANEOUS
  Administered 2017-12-15 – 2017-12-16 (×3): 3 [IU] via SUBCUTANEOUS
  Administered 2017-12-16: 5 [IU] via SUBCUTANEOUS
  Administered 2017-12-16 (×2): 3 [IU] via SUBCUTANEOUS
  Administered 2017-12-16: 5 [IU] via SUBCUTANEOUS
  Administered 2017-12-16: 3 [IU] via SUBCUTANEOUS
  Administered 2017-12-17: 5 [IU] via SUBCUTANEOUS
  Administered 2017-12-17: 8 [IU] via SUBCUTANEOUS
  Administered 2017-12-17: 3 [IU] via SUBCUTANEOUS
  Administered 2017-12-17: 5 [IU] via SUBCUTANEOUS
  Administered 2017-12-17: 8 [IU] via SUBCUTANEOUS
  Administered 2017-12-17: 5 [IU] via SUBCUTANEOUS
  Administered 2017-12-18: 8 [IU] via SUBCUTANEOUS
  Administered 2017-12-18 (×2): 11 [IU] via SUBCUTANEOUS
  Administered 2017-12-18: 5 [IU] via SUBCUTANEOUS
  Administered 2017-12-18: 11 [IU] via SUBCUTANEOUS
  Administered 2017-12-18: 8 [IU] via SUBCUTANEOUS
  Administered 2017-12-18: 11 [IU] via SUBCUTANEOUS
  Administered 2017-12-19 (×2): 8 [IU] via SUBCUTANEOUS

## 2017-12-14 MED ORDER — OXYCODONE HCL 5 MG PO TABS
5.0000 mg | ORAL_TABLET | Freq: Three times a day (TID) | ORAL | Status: DC | PRN
  Administered 2017-12-14: 5 mg via ORAL
  Filled 2017-12-14: qty 1

## 2017-12-14 MED ORDER — MAGNESIUM SULFATE 2 GM/50ML IV SOLN
2.0000 g | Freq: Once | INTRAVENOUS | Status: AC
  Administered 2017-12-14: 2 g via INTRAVENOUS
  Filled 2017-12-14: qty 50

## 2017-12-14 MED ORDER — GABAPENTIN 300 MG PO CAPS
300.0000 mg | ORAL_CAPSULE | Freq: Three times a day (TID) | ORAL | Status: DC
  Administered 2017-12-14: 300 mg via ORAL
  Filled 2017-12-14 (×3): qty 1

## 2017-12-14 MED ORDER — ALBUTEROL (5 MG/ML) CONTINUOUS INHALATION SOLN
10.0000 mg/h | INHALATION_SOLUTION | RESPIRATORY_TRACT | Status: DC
  Administered 2017-12-14: 10 mg/h via RESPIRATORY_TRACT
  Filled 2017-12-14: qty 20

## 2017-12-14 MED ORDER — ACETAMINOPHEN 325 MG PO TABS: 650.0000 mg | ORAL_TABLET | Freq: Four times a day (QID) | ORAL | Status: DC | PRN

## 2017-12-14 MED ORDER — AMITRIPTYLINE HCL 100 MG PO TABS
100.0000 mg | ORAL_TABLET | Freq: Every day | ORAL | Status: DC
  Administered 2017-12-14: 100 mg via ORAL
  Filled 2017-12-14: qty 2

## 2017-12-14 MED ORDER — FUROSEMIDE 10 MG/ML IJ SOLN
60.0000 mg | Freq: Once | INTRAMUSCULAR | Status: AC
Start: 2017-12-14 — End: 2017-12-14
  Administered 2017-12-14: 60 mg via INTRAVENOUS
  Filled 2017-12-14 (×2): qty 6

## 2017-12-14 MED ORDER — IPRATROPIUM BROMIDE 0.02 % IN SOLN
0.5000 mg | Freq: Once | RESPIRATORY_TRACT | Status: AC
  Administered 2017-12-14: 0.5 mg via RESPIRATORY_TRACT
  Filled 2017-12-14: qty 2.5

## 2017-12-14 MED ORDER — SENNOSIDES-DOCUSATE SODIUM 8.6-50 MG PO TABS
1.0000 | ORAL_TABLET | Freq: Every evening | ORAL | Status: DC | PRN
  Filled 2017-12-14: qty 1

## 2017-12-14 MED ORDER — LEVALBUTEROL HCL 0.63 MG/3ML IN NEBU: 0.6300 mg | INHALATION_SOLUTION | Freq: Four times a day (QID) | RESPIRATORY_TRACT | Status: DC | PRN

## 2017-12-14 MED ORDER — METHYLPREDNISOLONE SODIUM SUCC 125 MG IJ SOLR
60.0000 mg | Freq: Three times a day (TID) | INTRAMUSCULAR | Status: DC
  Administered 2017-12-14 – 2017-12-21 (×20): 60 mg via INTRAVENOUS
  Filled 2017-12-14: qty 0.96
  Filled 2017-12-14: qty 2
  Filled 2017-12-14 (×7): qty 0.96
  Filled 2017-12-14: qty 2
  Filled 2017-12-14 (×3): qty 0.96
  Filled 2017-12-14: qty 2
  Filled 2017-12-14 (×2): qty 0.96
  Filled 2017-12-14: qty 2
  Filled 2017-12-14 (×5): qty 0.96

## 2017-12-14 MED ORDER — BICTEGRAVIR-EMTRICITAB-TENOFOV 50-200-25 MG PO TABS
1.0000 | ORAL_TABLET | Freq: Every day | ORAL | Status: DC
  Filled 2017-12-14 (×2): qty 1

## 2017-12-14 MED ORDER — APIXABAN 5 MG PO TABS
5.0000 mg | ORAL_TABLET | Freq: Two times a day (BID) | ORAL | Status: DC
  Administered 2017-12-14: 5 mg via ORAL
  Filled 2017-12-14 (×2): qty 1

## 2017-12-14 MED ORDER — SODIUM CHLORIDE 0.9% FLUSH
3.0000 mL | Freq: Two times a day (BID) | INTRAVENOUS | Status: DC
  Administered 2017-12-15 – 2017-12-29 (×26): 3 mL via INTRAVENOUS

## 2017-12-14 MED ORDER — LEVALBUTEROL HCL 0.63 MG/3ML IN NEBU
0.6300 mg | INHALATION_SOLUTION | Freq: Four times a day (QID) | RESPIRATORY_TRACT | Status: DC
  Administered 2017-12-14 – 2017-12-22 (×33): 0.63 mg via RESPIRATORY_TRACT
  Filled 2017-12-14 (×63): qty 3

## 2017-12-14 MED ORDER — ALBUTEROL (5 MG/ML) CONTINUOUS INHALATION SOLN
10.0000 mg/h | INHALATION_SOLUTION | RESPIRATORY_TRACT | Status: DC
  Administered 2017-12-14: 10 mg/h via RESPIRATORY_TRACT

## 2017-12-14 MED ORDER — DOXYCYCLINE HYCLATE 100 MG PO TABS
100.0000 mg | ORAL_TABLET | Freq: Two times a day (BID) | ORAL | Status: DC
  Administered 2017-12-14 – 2017-12-15 (×2): 100 mg via ORAL
  Filled 2017-12-14 (×4): qty 1

## 2017-12-14 MED ORDER — BUDESONIDE 0.25 MG/2ML IN SUSP
0.2500 mg | Freq: Two times a day (BID) | RESPIRATORY_TRACT | Status: DC
  Administered 2017-12-14 – 2017-12-15 (×2): 0.25 mg via RESPIRATORY_TRACT
  Filled 2017-12-14 (×4): qty 2

## 2017-12-14 MED ORDER — INSULIN DETEMIR 100 UNIT/ML ~~LOC~~ SOLN
30.0000 [IU] | Freq: Every day | SUBCUTANEOUS | Status: DC
  Administered 2017-12-14 – 2017-12-19 (×6): 30 [IU] via SUBCUTANEOUS
  Filled 2017-12-14 (×7): qty 0.3

## 2017-12-14 MED ORDER — DILTIAZEM HCL ER COATED BEADS 180 MG PO CP24
360.0000 mg | ORAL_CAPSULE | Freq: Every day | ORAL | Status: DC
  Administered 2017-12-16 – 2017-12-17 (×2): 360 mg via ORAL
  Filled 2017-12-14 (×3): qty 2

## 2017-12-14 MED ORDER — GUAIFENESIN ER 600 MG PO TB12
600.0000 mg | ORAL_TABLET | Freq: Two times a day (BID) | ORAL | Status: DC
  Administered 2017-12-14: 600 mg via ORAL
  Filled 2017-12-14 (×2): qty 1

## 2017-12-14 MED ORDER — BUDESONIDE 0.5 MG/2ML IN SUSP
1.0000 mg | Freq: Once | RESPIRATORY_TRACT | Status: AC
  Administered 2017-12-14: 1 mg via RESPIRATORY_TRACT
  Filled 2017-12-14: qty 4

## 2017-12-14 NOTE — ED Triage Notes (Signed)
Pt arrives by [facility] with respiratory distress from home. Pt has history of COPD with chronic home o2 use of 3L [place]. Pt had recent admission in March where he ended up with a tracheostomy which was reversed.  Pt is alert however has labored breathing with wheezing.

## 2017-12-14 NOTE — Progress Notes (Signed)
Placed pt on bipap due to continued increased WOB and pt stating CAT "this isnt working man". As soon as bipap was placed pt began asking" how long I gotta wear this". RT explained to pt that for it to be beneficial he needed to wear it for at least an hour probably longer but that this RT would keep a close eye on him and take it off as soon as it was safe and beneficial for the pt

## 2017-12-14 NOTE — Progress Notes (Signed)
Pt requesting break from his 10 mg HHN CAT tx at this time. Pt placed on 6L [place] but c/o he only wears 3 at home. RT explained he needed more o2 bc his spo2 was low
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
## 2017-12-14 NOTE — ED Notes (Signed)
Pt is not very cooperative with resp therapist- pt spoke with dr Eudelia Bunch and resp therapist - cheooses not to be placed on bipap, refusing intubation also unless he becomes pulseless. Pt has wheezes throughout all lung fields.

## 2017-12-14 NOTE — Telephone Encounter (Signed)
Patient was called on 12-11-2017 and was instructed to come in for blood work after his 2pm appointment due to his most recent lab results from his hospital admission which showed poor kidney function. I instructed him that eliquis is not recommended with low cr+/GFr and his labs needed to be repeated. I am willing to prescribe a limited supply of eliquis for only 30 days. He needs to have creatinine rechecked. He verbalized agreement.

## 2017-12-14 NOTE — Progress Notes (Signed)
RT to pts bedside. Pt bipap circuit on floor and pt ripping mask off. Pt states he is refusing to go back on bipap. MD to bedside. Pt refusing bipap and intubation and only wants breathing treatments and his "inhalers" which is symbicort. RT to order an additional 10 MG albuterol CAT with pulmicort per MD
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
## 2017-12-15 ENCOUNTER — Ambulatory Visit: Payer: Self-pay | Admitting: Internal Medicine

## 2017-12-15 ENCOUNTER — Inpatient Hospital Stay (HOSPITAL_COMMUNITY): Payer: Medicare HMO

## 2017-12-15 ENCOUNTER — Ambulatory Visit: Payer: Self-pay | Admitting: Adult Health

## 2017-12-15 DIAGNOSIS — K219 Gastro-esophageal reflux disease without esophagitis: Secondary | ICD-10-CM

## 2017-12-15 DIAGNOSIS — E1122 Type 2 diabetes mellitus with diabetic chronic kidney disease: Secondary | ICD-10-CM

## 2017-12-15 DIAGNOSIS — J449 Chronic obstructive pulmonary disease, unspecified: Secondary | ICD-10-CM

## 2017-12-15 DIAGNOSIS — E875 Hyperkalemia: Secondary | ICD-10-CM

## 2017-12-15 DIAGNOSIS — Z836 Family history of other diseases of the respiratory system: Secondary | ICD-10-CM

## 2017-12-15 DIAGNOSIS — B370 Candidal stomatitis: Secondary | ICD-10-CM

## 2017-12-15 DIAGNOSIS — B20 Human immunodeficiency virus [HIV] disease: Secondary | ICD-10-CM

## 2017-12-15 DIAGNOSIS — R061 Stridor: Secondary | ICD-10-CM

## 2017-12-15 DIAGNOSIS — J9621 Acute and chronic respiratory failure with hypoxia: Secondary | ICD-10-CM

## 2017-12-15 DIAGNOSIS — Z9911 Dependence on respirator [ventilator] status: Secondary | ICD-10-CM

## 2017-12-15 DIAGNOSIS — J9622 Acute and chronic respiratory failure with hypercapnia: Secondary | ICD-10-CM

## 2017-12-15 DIAGNOSIS — N182 Chronic kidney disease, stage 2 (mild): Secondary | ICD-10-CM

## 2017-12-15 DIAGNOSIS — Z833 Family history of diabetes mellitus: Secondary | ICD-10-CM

## 2017-12-15 DIAGNOSIS — F17210 Nicotine dependence, cigarettes, uncomplicated: Secondary | ICD-10-CM

## 2017-12-15 DIAGNOSIS — B371 Pulmonary candidiasis: Secondary | ICD-10-CM

## 2017-12-15 DIAGNOSIS — Z800 Family history of malignant neoplasm of digestive organs: Secondary | ICD-10-CM

## 2017-12-15 LAB — BLOOD GAS, ARTERIAL
Acid-Base Excess: 3.6 mmol/L — ABNORMAL HIGH (ref 0.0–2.0)
Bicarbonate: 32.5 mmol/L — ABNORMAL HIGH (ref 20.0–28.0)
DRAWN BY: 51185
FIO2: 100
MECHVT: 570 mL
O2 Saturation: 98.9 %
PATIENT TEMPERATURE: 98.6
PCO2 ART: 105 mmHg — AB (ref 32.0–48.0)
PEEP: 5 cmH2O
PH ART: 7.117 — AB (ref 7.350–7.450)
RATE: 15 resp/min
pO2, Arterial: 218 mmHg — ABNORMAL HIGH (ref 83.0–108.0)

## 2017-12-15 LAB — GLUCOSE, CAPILLARY
GLUCOSE-CAPILLARY: 158 mg/dL — AB (ref 65–99)
GLUCOSE-CAPILLARY: 167 mg/dL — AB (ref 65–99)
GLUCOSE-CAPILLARY: 191 mg/dL — AB (ref 65–99)
Glucose-Capillary: 170 mg/dL — ABNORMAL HIGH (ref 65–99)
Glucose-Capillary: 181 mg/dL — ABNORMAL HIGH (ref 65–99)
Glucose-Capillary: 184 mg/dL — ABNORMAL HIGH (ref 65–99)
Glucose-Capillary: 254 mg/dL — ABNORMAL HIGH (ref 65–99)

## 2017-12-15 LAB — BASIC METABOLIC PANEL WITH GFR
Anion gap: 6 (ref 5–15)
BUN: 23 mg/dL — ABNORMAL HIGH (ref 6–20)
CO2: 33 mmol/L — ABNORMAL HIGH (ref 22–32)
Calcium: 9.4 mg/dL (ref 8.9–10.3)
Chloride: 101 mmol/L (ref 101–111)
Creatinine, Ser: 1.36 mg/dL — ABNORMAL HIGH (ref 0.61–1.24)
GFR calc Af Amer: >60 mL/min
GFR calc non Af Amer: 59 mL/min — ABNORMAL LOW
Glucose, Bld: 140 mg/dL — ABNORMAL HIGH (ref 65–99)
Potassium: 6.3 mmol/L (ref 3.5–5.1)
Sodium: 140 mmol/L (ref 135–145)

## 2017-12-15 LAB — POCT I-STAT 3, ART BLOOD GAS (G3+)
Acid-Base Excess: 3 mmol/L — ABNORMAL HIGH (ref 0.0–2.0)
BICARBONATE: 29.9 mmol/L — AB (ref 20.0–28.0)
O2 Saturation: 92 %
TCO2: 32 mmol/L (ref 22–32)
pCO2 arterial: 58.5 mmHg — ABNORMAL HIGH (ref 32.0–48.0)
pH, Arterial: 7.316 — ABNORMAL LOW (ref 7.350–7.450)
pO2, Arterial: 71 mmHg — ABNORMAL LOW (ref 83.0–108.0)

## 2017-12-15 LAB — BASIC METABOLIC PANEL
ANION GAP: 8 (ref 5–15)
ANION GAP: 8 (ref 5–15)
Anion gap: 9 (ref 5–15)
BUN: 20 mg/dL (ref 6–20)
BUN: 19 mg/dL (ref 6–20)
BUN: 34 mg/dL — AB (ref 6–20)
CALCIUM: 9.3 mg/dL (ref 8.9–10.3)
CHLORIDE: 101 mmol/L (ref 101–111)
CHLORIDE: 100 mmol/L — AB (ref 101–111)
CO2: 28 mmol/L (ref 22–32)
CO2: 30 mmol/L (ref 22–32)
CO2: 28 mmol/L (ref 22–32)
Calcium: 9.5 mg/dL (ref 8.9–10.3)
Calcium: 9.5 mg/dL (ref 8.9–10.3)
Chloride: 103 mmol/L (ref 101–111)
Creatinine, Ser: 1.32 mg/dL — ABNORMAL HIGH (ref 0.61–1.24)
Creatinine, Ser: 1.25 mg/dL — ABNORMAL HIGH (ref 0.61–1.24)
Creatinine, Ser: 1.76 mg/dL — ABNORMAL HIGH (ref 0.61–1.24)
GFR calc Af Amer: >60 mL/min (ref >60)
GFR calc Af Amer: >60 mL/min (ref >60)
GFR calc Af Amer: 50 mL/min — ABNORMAL LOW (ref >60)
GFR calc non Af Amer: >60 mL/min (ref >60)
GFR calc non Af Amer: 43 mL/min — ABNORMAL LOW (ref >60)
GLUCOSE: 178 mg/dL — AB (ref 65–99)
GLUCOSE: 181 mg/dL — AB (ref 65–99)
Glucose, Bld: 200 mg/dL — ABNORMAL HIGH (ref 65–99)
POTASSIUM: 5.9 mmol/L — AB (ref 3.5–5.1)
Potassium: 5.5 mmol/L — ABNORMAL HIGH (ref 3.5–5.1)
Potassium: 5.7 mmol/L — ABNORMAL HIGH (ref 3.5–5.1)
Sodium: 138 mmol/L (ref 135–145)
Sodium: 138 mmol/L (ref 135–145)
Sodium: 139 mmol/L (ref 135–145)

## 2017-12-15 LAB — TROPONIN I
Troponin I: <0.03 ng/mL (ref <0.03)
Troponin I: <0.03 ng/mL (ref <0.03)

## 2017-12-15 LAB — MRSA PCR SCREENING: MRSA by PCR: NEGATIVE

## 2017-12-15 LAB — CBC
HEMATOCRIT: 38 % — AB (ref 39.0–52.0)
Hemoglobin: 11.6 g/dL — ABNORMAL LOW (ref 13.0–17.0)
MCH: 29 pg (ref 26.0–34.0)
MCHC: 30.5 g/dL (ref 30.0–36.0)
MCV: 95 fL (ref 78.0–100.0)
Platelets: 260 10*3/uL (ref 150–400)
RBC: 4 MIL/uL — ABNORMAL LOW (ref 4.22–5.81)
RDW: 14.2 % (ref 11.5–15.5)
WBC: 10.6 10*3/uL — ABNORMAL HIGH (ref 4.0–10.5)

## 2017-12-15 LAB — PROCALCITONIN: Procalcitonin: 0.1 ng/mL

## 2017-12-15 LAB — TRIGLYCERIDES: Triglycerides: 99 mg/dL

## 2017-12-15 MED ORDER — ROCURONIUM BROMIDE 50 MG/5ML IV SOLN
50.0000 mg | Freq: Once | INTRAVENOUS | Status: AC
  Administered 2017-12-15: 50 mg via INTRAVENOUS
  Filled 2017-12-15: qty 5

## 2017-12-15 MED ORDER — SODIUM CHLORIDE 0.9 % IV SOLN
1.0000 g | Freq: Once | INTRAVENOUS | Status: AC
  Administered 2017-12-15: 1 g via INTRAVENOUS
  Filled 2017-12-15: qty 10

## 2017-12-15 MED ORDER — CHLORHEXIDINE GLUCONATE 0.12% ORAL RINSE (MEDLINE KIT)
15.0000 mL | Freq: Two times a day (BID) | OROMUCOSAL | Status: DC
  Administered 2017-12-15 – 2017-12-20 (×10): 15 mL via OROMUCOSAL

## 2017-12-15 MED ORDER — PROPOFOL 1000 MG/100ML IV EMUL
0.0000 ug/kg/min | INTRAVENOUS | Status: AC
  Administered 2017-12-15 (×2): 40 ug/kg/min via INTRAVENOUS
  Administered 2017-12-15: 15 ug/kg/min via INTRAVENOUS
  Administered 2017-12-15 – 2017-12-16 (×3): 35 ug/kg/min via INTRAVENOUS
  Filled 2017-12-15 (×4): qty 100

## 2017-12-15 MED ORDER — MIDAZOLAM HCL 2 MG/2ML IJ SOLN
INTRAMUSCULAR | Status: AC
  Filled 2017-12-15: qty 2

## 2017-12-15 MED ORDER — APIXABAN 5 MG PO TABS
5.0000 mg | ORAL_TABLET | Freq: Two times a day (BID) | ORAL | Status: DC
  Administered 2017-12-15 – 2017-12-29 (×28): 5 mg
  Filled 2017-12-15 (×29): qty 1

## 2017-12-15 MED ORDER — FUROSEMIDE 10 MG/ML IJ SOLN
INTRAMUSCULAR | Status: AC
  Administered 2017-12-15: 80 mg via INTRAVENOUS
  Filled 2017-12-15: qty 8

## 2017-12-15 MED ORDER — DEXTROSE 50 % IV SOLN
50.0000 mL | Freq: Once | INTRAVENOUS | Status: AC
  Administered 2017-12-15: 50 mL via INTRAVENOUS
  Filled 2017-12-15: qty 50

## 2017-12-15 MED ORDER — SODIUM BICARBONATE 8.4 % IV SOLN
50.0000 meq | Freq: Once | INTRAVENOUS | Status: AC
  Administered 2017-12-15: 50 meq via INTRAVENOUS
  Filled 2017-12-15: qty 50

## 2017-12-15 MED ORDER — CLINDAMYCIN PHOSPHATE 900 MG/50ML IV SOLN
900.0000 mg | Freq: Three times a day (TID) | INTRAVENOUS | Status: DC
  Administered 2017-12-15 – 2017-12-18 (×9): 900 mg via INTRAVENOUS
  Filled 2017-12-15 (×10): qty 50

## 2017-12-15 MED ORDER — FLUCONAZOLE IN SODIUM CHLORIDE 400-0.9 MG/200ML-% IV SOLN
400.0000 mg | INTRAVENOUS | Status: DC
  Administered 2017-12-15 – 2017-12-20 (×6): 400 mg via INTRAVENOUS
  Filled 2017-12-15 (×8): qty 200

## 2017-12-15 MED ORDER — INSULIN ASPART 100 UNIT/ML IV SOLN
10.0000 [IU] | Freq: Once | INTRAVENOUS | Status: AC
  Administered 2017-12-15: 10 [IU] via INTRAVENOUS

## 2017-12-15 MED ORDER — SODIUM POLYSTYRENE SULFONATE 15 GM/60ML PO SUSP
30.0000 g | Freq: Once | ORAL | Status: AC
  Administered 2017-12-15: 30 g
  Filled 2017-12-15: qty 120

## 2017-12-15 MED ORDER — PROPOFOL 1000 MG/100ML IV EMUL
INTRAVENOUS | Status: AC
  Administered 2017-12-15: 15 ug/kg/min via INTRAVENOUS
  Filled 2017-12-15: qty 100

## 2017-12-15 MED ORDER — FENTANYL CITRATE (PF) 100 MCG/2ML IJ SOLN
INTRAMUSCULAR | Status: AC
  Filled 2017-12-15: qty 2

## 2017-12-15 MED ORDER — ORAL CARE MOUTH RINSE
15.0000 mL | OROMUCOSAL | Status: DC
  Administered 2017-12-15 – 2017-12-20 (×48): 15 mL via OROMUCOSAL

## 2017-12-15 MED ORDER — EMTRICITABINE 200 MG PO CAPS
200.0000 mg | ORAL_CAPSULE | Freq: Every day | ORAL | Status: DC
  Administered 2017-12-17 – 2017-12-20 (×3): 200 mg via ORAL
  Filled 2017-12-15 (×6): qty 1

## 2017-12-15 MED ORDER — DAPSONE 100 MG PO TABS
100.0000 mg | ORAL_TABLET | Freq: Every day | ORAL | Status: DC
  Filled 2017-12-15: qty 1

## 2017-12-15 MED ORDER — MIDAZOLAM HCL 2 MG/2ML IJ SOLN
INTRAMUSCULAR | Status: AC
  Administered 2017-12-15: 2 mg via INTRAVENOUS
  Filled 2017-12-15: qty 4

## 2017-12-15 MED ORDER — PANTOPRAZOLE SODIUM 40 MG PO PACK
40.0000 mg | PACK | Freq: Every day | ORAL | Status: DC
  Administered 2017-12-15 – 2017-12-19 (×5): 40 mg
  Filled 2017-12-15 (×7): qty 20

## 2017-12-15 MED ORDER — FENTANYL CITRATE (PF) 100 MCG/2ML IJ SOLN
100.0000 ug | INTRAMUSCULAR | Status: DC | PRN
  Administered 2017-12-19 – 2017-12-20 (×4): 100 ug via INTRAVENOUS

## 2017-12-15 MED ORDER — TENOFOVIR DISOPROXIL FUMARATE 300 MG PO TABS
300.0000 mg | ORAL_TABLET | Freq: Every day | ORAL | Status: DC
  Administered 2017-12-16 – 2017-12-18 (×2): 300 mg via ORAL
  Filled 2017-12-15 (×6): qty 1

## 2017-12-15 MED ORDER — MIDAZOLAM HCL 2 MG/2ML IJ SOLN
2.0000 mg | Freq: Once | INTRAMUSCULAR | Status: AC
  Administered 2017-12-15: 2 mg via INTRAVENOUS

## 2017-12-15 MED ORDER — FUROSEMIDE 10 MG/ML IJ SOLN
80.0000 mg | Freq: Once | INTRAMUSCULAR | Status: AC
Start: 2017-12-15 — End: 2017-12-15
  Administered 2017-12-15: 80 mg via INTRAVENOUS

## 2017-12-15 MED ORDER — FENTANYL CITRATE (PF) 100 MCG/2ML IJ SOLN
100.0000 ug | Freq: Once | INTRAMUSCULAR | Status: AC
  Administered 2017-12-15: 100 ug via INTRAVENOUS

## 2017-12-15 MED ORDER — PRIMAQUINE PHOSPHATE 26.3 MG PO TABS
30.0000 mg | ORAL_TABLET | Freq: Every day | ORAL | Status: DC
  Administered 2017-12-15 – 2017-12-18 (×4): 30 mg
  Filled 2017-12-15 (×4): qty 2

## 2017-12-15 MED ORDER — FENTANYL CITRATE (PF) 100 MCG/2ML IJ SOLN
INTRAMUSCULAR | Status: AC
  Administered 2017-12-15: 100 ug via INTRAVENOUS
  Filled 2017-12-15: qty 4

## 2017-12-15 MED ORDER — SODIUM CHLORIDE 0.9 % IV BOLUS
1000.0000 mL | Freq: Once | INTRAVENOUS | Status: AC
  Administered 2017-12-15: 1000 mL via INTRAVENOUS

## 2017-12-15 MED ORDER — ETOMIDATE 2 MG/ML IV SOLN
20.0000 mg | Freq: Once | INTRAVENOUS | Status: AC
  Administered 2017-12-15: 20 mg via INTRAVENOUS

## 2017-12-15 MED ORDER — PANTOPRAZOLE SODIUM 40 MG PO TBEC: 40.0000 mg | DELAYED_RELEASE_TABLET | Freq: Two times a day (BID) | ORAL | Status: DC

## 2017-12-15 MED ORDER — DOLUTEGRAVIR SODIUM 50 MG PO TABS
50.0000 mg | ORAL_TABLET | Freq: Every day | ORAL | Status: DC
  Administered 2017-12-15 – 2017-12-20 (×6): 50 mg via ORAL
  Filled 2017-12-15 (×7): qty 1

## 2017-12-15 MED ORDER — AMITRIPTYLINE HCL 100 MG PO TABS
100.0000 mg | ORAL_TABLET | Freq: Every day | ORAL | Status: DC
  Administered 2017-12-15: 100 mg
  Filled 2017-12-15: qty 1

## 2017-12-15 MED ORDER — FENTANYL CITRATE (PF) 100 MCG/2ML IJ SOLN
100.0000 ug | INTRAMUSCULAR | Status: DC | PRN
  Administered 2017-12-15: 100 ug via INTRAVENOUS
  Filled 2017-12-15: qty 2

## 2017-12-15 NOTE — Progress Notes (Signed)
Writer at bedside this AM patient struggling to breath, patient on 5L [place].Patient has order for BIPAP respiratory therapy notified that bipap is needed. Provider at bedside requesting that nebs be given. Respiratory at bedside to administer neb O2 saturation 80- 90%. Patient agreed to Bipap, then breakfast was at bedside, patient became upset when he was informed that due to him  Needing bipap he is not able to eat, Patient became very aggiated and was swearing at staff and refusing bipap so he can eat. Received orders to make patient Npo and patient transferred to ICU Chris [name]. Bedside report give to RN. Pete Pelt Miliano

## 2017-12-15 NOTE — Progress Notes (Addendum)
PROGRESS NOTE   Chris [name].  YQM:[id number]    DOB: [birth year]    DOA: 12/14/2017  PCP: Claiborne Rigg, NP   I have briefly reviewed patients previous medical records in [facility].  Brief Narrative:  Chris [name]. is a 52 year old married male, independent, PMH of HIV (Dr. Drue Second, ID), gold 3 COPD (Dr. Sherene Sires, Pulmonology), chronic respiratory failure with hypoxia on intermittent home oxygen, ongoing tobacco abuse, anxiety and depression, PAF on Eliquis, chronic diastolic CHF, type II DM, HTN, GERD hospitalized 10/01/2017-10/29/2017 for influenza pneumonia causing ARDS, hospital course complicated by acute renal failure that required temporary hemodialysis, ventilator dependent respiratory failure & difficult to wean resulting in tracheostomy, Candida esophagitis, A. fib with RVR, improved and discharged to [facility], tracheostomy decannulated and HD catheter off, presented to [facility] ED on 12/14/2017 with 3 days history of worsening dyspnea.  Presented to [facility] ED in extremis but would not keep on BiPAP, pulled it off, full code confirmed, admitted to stepdown unit for COPD exacerbation, acute on chronic hypoxic and hypercapnic respiratory failure.  On 5/7 morning, noted to be in acute respiratory distress, CCM consulted and patient transferred to ICU for close monitoring and further management.  Remains at high risk for intubation and mechanical ventilation.  Assessment & Plan:   Principal Problem:   COPD exacerbation (HCC) Active Problems:   Obstructive sleep apnea syndrome   Essential hypertension, benign   Cigarette nicotine dependence without complication   GERD (gastroesophageal reflux disease)   Acute on chronic respiratory failure with hypoxia (HCC)   PAF (paroxysmal atrial fibrillation) (HCC)   Diabetes mellitus type 2 in obese (HCC)   HIV (human immunodeficiency virus infection) (HCC)     COPD exacerbation: Possibly precipitated by acute viral bronchitis versus ongoing tobacco  abuse versus LPR.  Chest x-ray without pneumonia.  Tobacco cessation counseled.  Started IV Solu-Medrol 60 mg every 8 hourly, Xopenex and Atrovent nebulization scheduled and Xopenex nebulization as needed (avoiding albuterol due to history of PAF and mild sinus tachycardia on admission), Pulmicort nebulizations discontinued due to oral thrush, oral doxycycline and added flutter valve.  Had improved when I assessed him in the ED on admission.  However worsened 5/7 morning, CCM consulted and transferred to ICU >please see discussion below.  Acute on chronic hypoxic and hypercapnic respiratory failure: ABG on admission reviewed: pH 7.274, PCO2 58, PO2 74, bicarbonate 27 and oxygen saturation 92%.  Did not tolerate BiPAP in the ED but states that he did not have a call bell and there was nobody listening to him when it started alarming and hence he pulled it off.  Willing to try it if he is under close observation.  He reiterates full code. Treat COPD as above. Titrate oxygen to saturations between 89-92%.  BiPAP as needed. Was NPO on admission but diet was started sometime overnight.  Patient noncompliant and this morning noted to be in acute respiratory distress.  CCM consulted and suspect COPD exacerbation, LPR, possible tracheal stenosis contributing.  They transferred him to ICU, continuous BiPAP, IV fluconazole added, NPO, close observation and at high risk for intubation.  Repeat ABG overnight showed unchanged pH but PCO2 was slightly better (pH 7.28, PCO2 50, PO2 72).  Suspected LPR: Continue twice daily PPI.  Possible tracheal stenosis: History of recent tracheostomy and decannulation.  Management per pulmonology.  Oral thrush: IV fluconazole started by CCM on 5/7.  Tobacco abuse: Cessation counseled.  Patient requests nicotine patch.  Acute on chronic  diastolic CHF.  Mildly volume overloaded on admission as evidenced by pedal edema but not much pulmonary edema.  Received a dose of IV Lasix 60 mg IV  x1 in ED.  Strict intake output and daily weights.  Appears euvolemic on follow-up, diuretics not continued.  Type II DM: Uncontrolled now due to steroids.  SSI and prior home dose of Levemir.  Adjust insulins as needed.  Essential hypertension: Mildly uncontrolled likely related to acute respiratory distress and hyperadrenergic state.  Continue Cardizem.  Normocytic anemia: Stable.  Stage II chronic kidney disease: History of acute renal failure during last hospitalization requiring temporary dialysis.  Creatinine at that discharge was 5.44.  Presented with creatinine of 1.29 which may be his baseline.    Creatinine is gone up from 1.29 on admission to 1.32, likely related to a dose of IV Lasix.  Lasix not continued.  Follow BMP in a.m.  Hyperkalemia: Unclear etiology.  Potassium up from 4 > 5.5 > 5.9 despite creatinine dropping down to 1.25.  Not on ARB.  When diet resumed, renal diet recommended.  I discussed with CCM regarding close monitoring and?  Kayexalate.  HIV: Continue ART.  Outpatient follow-up with ID.  HIV 1 RNA <20/not detected.  PAF: Currently with SR-ST.  Monitor on telemetry.  Continue Cardizem CD and Eliquis.  Anxiety and depression: Continue home dose of amitriptyline     DVT prophylaxis: On full dose Eliquis. Code Status: Full. Family Communication: None at bedside. Disposition: Initially admitted to stepdown unit on 5/6.  Deteriorated on the morning of 5/7, CCM was consulted and patient was transferred to ICU for close monitoring and management.   Consultants:  CCM  Procedures:  BiPAP  Antimicrobials:  Oral doxycycline 5/6 >   Subjective: Patient interviewed and examined along with patient's RN and charge nurse in the room.  Worsened dyspnea, wheezing.  Noted patient sitting up with food in his mouth, acute respiratory distress, audible wheezing and active accessory muscles.  Promptly requested him to stop eating, patient extremely upset by this and started  using foul language.  Requested nursing to get RT to nebulized him stat.  Consulted CCM and evaluated at bedside with CCM.  No chest pain.  ROS: As above  Objective:  Vitals:   12/15/17 0749 12/15/17 0824 12/15/17 0833 12/15/17 0916  BP: (!) 162/81   (!) 152/76  Pulse: (!) 106   (!) 112  Resp: (!) 22   (!) 22  Temp: 98 F (36.7 C)   98 F (36.7 C)  TempSrc: Oral   Oral  SpO2: 94% 92% 93% 95%  Weight:      Height:        Examination:  General exam: Middle-aged male, moderately built and obese, sitting up at edge of bed with acute respiratory distress. Respiratory system: Markedly diminished breath sounds bilaterally with scattered bilateral medium pitched expiratory rhonchi and upper airway wheeze.  No crackles.  No stridor.  Severe respiratory distress.  Accessory muscles active.  Healed tracheostomy scar +.  Right HD catheter scar. Cardiovascular system: S1 & S2 heard, mild regular tachycardia. No JVD, murmurs, rubs, gallops or clicks. No pedal edema.  Telemetry personally reviewed: SR-mild sinus tachycardia in the 100s. Gastrointestinal system: Abdomen is nondistended but obese, soft and nontender. No organomegaly or masses felt. Normal bowel sounds heard.  Midline laparotomy scar. Central nervous system: Alert and oriented. No focal neurological deficits. Extremities: Symmetric 5 x 5 power. Skin: No rashes, lesions or ulcers Psychiatry: Judgement and insight  impaired. Mood & affect appropriate.     Data Reviewed: I have personally reviewed following labs and imaging studies  CBC: Recent Labs  Lab 12/14/17 1526 12/14/17 1550 12/15/17 0602  WBC 8.9  --  10.6*  NEUTROABS 6.3  --   --   HGB 11.6* 12.6* 11.6*  HCT 37.1* 37.0* 38.0*  MCV 94.9  --  95.0  PLT 259  --  260   Basic Metabolic Panel: Recent Labs  Lab 12/14/17 1526 12/14/17 1550 12/15/17 0602  NA 139 141 138  K 4.0 3.9 5.5*  CL 103 103 101  CO2 27  --  28  GLUCOSE 131* 129* 178*  BUN CREATININE 1.29* 1.20 1.32*  CALCIUM 9.0  --  9.5   Liver Function Tests: Recent Labs  Lab 12/14/17 1526  AST 21  ALT 20  ALKPHOS 46  BILITOT 0.3  PROT 7.7  ALBUMIN 3.4*   Cardiac Enzymes: Recent Labs  Lab 12/15/17 0012 12/15/17 0602  TROPONINI <0.03 <0.03   HbA1C: No results for input(s): HGBA1C in the last 72 hours. CBG: Recent Labs  Lab 12/14/17 2058 12/15/17 0033 12/15/17 0410  GLUCAP 302* 254* 181*    No results found for this or any previous visit (from the past 240 hour(s)).       Radiology Studies: Dg Chest Port 1 View  Result Date: 12/14/2017 CLINICAL DATA:  SOB. Hx COPD with chronic home o2 use. Hx CHF, DM, HTN. EXAM: PORTABLE CHEST 1 VIEW COMPARISON:  10/20/2017 FINDINGS: Cardiac silhouette is mildly enlarged.  No mediastinal or masses. There is prominence of the bronchovascular markings lung base interstitial thickening, similar to the prior exam. No convincing pneumonia or pulmonary edema. No pleural effusion or pneumothorax. A possible right internal jugular central venous line not well-defined on this image. The possible tip projects in the region of the upper superior vena cava. Skeletal structures are grossly intact. IMPRESSION: No acute cardiopulmonary disease. Electronically Signed   By: Amie Portland M.D.   On: 12/14/2017 15:58        Scheduled Meds: . amitriptyline  100 mg Oral QHS  . apixaban  5 mg Oral BID  . bictegravir-emtricitabine-tenofovir AF  1 tablet Oral Q1500  . diltiazem  360 mg Oral Daily  . doxycycline  100 mg Oral Q12H  . gabapentin  300 mg Oral TID  . guaiFENesin  600 mg Oral BID  . insulin aspart  0-15 Units Subcutaneous Q4H  . insulin detemir  30 Units Subcutaneous QHS  . ipratropium  0.5 mg Nebulization Q6H  . levalbuterol  0.63 mg Nebulization Q6H  . methylPREDNISolone (SOLU-MEDROL) injection  60 mg Intravenous Q8H  . pantoprazole  40 mg Oral BID  . sodium chloride flush  3 mL Intravenous Q12H   Continuous  Infusions: . albuterol 10 mg/hr (12/14/17 1646)  . fluconazole (DIFLUCAN) IV       LOS: 1 day   Patient is critically ill and at high risk of decline and even death.  65 minutes of critical care time spent in interviewing and examining patient, reviewing labs and imaging, formulating treatment plan, coordinating care with nursing, RT and CCM at bedside.  Marcellus Scott, MD, FACP, [facility]. [facility] Pager [phone]  If 7PM-7AM, please contact night-coverage [email or web address] Password [facility] 12/15/2017, 10:47 AM

## 2017-12-15 NOTE — Plan of Care (Signed)
Discussed plan of care with patient.  Encouraged pt to stop smoking.  Explained the benefits of exercising and eating a balanced diet.  Patient displayed minimal teach-back.

## 2017-12-15 NOTE — Consult Note (Signed)
PULMONARY / CRITICAL CARE MEDICINE   Name: Chris [name]. MRN: [id number] DOB: 11-13-1965    ADMISSION DATE:  12/14/2017 CONSULTATION DATE: 5/7  REFERRING MD:  [email or web address]  CHIEF COMPLAINT:  Acute on chronic respiratory failure   HISTORY OF PRESENT ILLNESS:   This is a 52 year old male patient with a known history of gold 3 chronic obstructive pulmonary disease, tobacco abuse, chronic diastolic heart failure , And atrial fibrillation rate controlled and on apixaban.  He was recently discharged from the hospital back in March 2019 following a prolonged hospital course initially for influenza pneumonia resulting in ARDS, further complicated by atrial fib with RVR, acute kidney injury requiring dialysis, esophageal candidiasis, and ultimately tracheostomy placement to assist with liberation from ventilator.  He went to [facility] where he was eventually weaned from the vent, he may renal recovery, and was decannulated.  He was seen in our office for hospital follow-up on 5/1.  Office note reports he was advancing with physical therapy twice weekly, requiring PRN oxygen usually with exertion, and had been cutting down on cigarettes to 4 cigarettes a day.  He was noted to have thrush at that time and prescribed nystatin.  He presented to the emergency room on 5/6 with chief complaint of worsening shortness of breath.  This apparently started approximately 3 days prior to presentation, there was associated cough productive of white sputum, no chest pain, required more frequent use of rescue bronchodilators which initially gave him relief however over the days to follow did not.  He denies any recent sick contacts, fevers or chills.  He did have mild swelling in his legs but no significant edema.  He still actively smoking.  In the ED chest x-ray demonstrated some right greater than left interstitial prominence markings fairly similar to previous films.  BNP was negative, he had no significant  leukocytosis, arterial blood gas showed mild acute hypercarbic respiratory failure he was initially treated with noninvasive ventilation which he ultimately took off and did not place back on.  He was also placed on systemic steroids, scheduled bronchodilators, empiric doxycycline, supplemental oxygen, and was given 1 dose of furosemide.  Over the course of the evening he continued to have significant work of breathing.  Apparently was not agreeable to using BiPAP at one point, and slowly developed worsening increased work of breathing by the a.m. hours of 5/7.  Critical care was consulted the a.m. of 5/7 as patient developed rather acute respiratory distress.  This had slowly become more progressive.  Upon time of critical care arrival the patient was sitting up on the side of the bed.  Had marked accessory use, and pronounced upper airway wheezing.   PAST MEDICAL HISTORY :  He  has a past medical history of Anxiety, Atrial fibrillation (HCC), Atrial fibrillation ([place]), CHF (congestive heart failure) ([place]), COPD (chronic obstructive pulmonary disease) ([place]), Depression, Diabetes mellitus without complication ([place]), Emphysema of lung ([place]), GERD (gastroesophageal reflux disease), HIV disease ([place]), Hypertension, and Oxygen deficiency.  PAST SURGICAL HISTORY: He  has a past surgical history that includes Irrigation and debridement abscess (Right, 04/10/2013); Exploratory laparotomy; Incision and drainage abscess (Right, 02/20/2015); knee FRACTURE SURGERY  (Right); arm surgery (Left); Esophagogastroduodenoscopy (egd) with propofol (N/A, 09/03/2016); Colonoscopy with propofol (N/A, 09/03/2016); IR Fluoro Guide CV Line Right (10/24/2017); and IR US Guide Vasc Access Right (10/24/2017).  Allergies  Allergen Reactions  . Bactrim [Sulfamethoxazole-Trimethoprim] Hives    No current facility-administered medications on file prior to encounter.    Current  Outpatient Medications on File Prior to Encounter  Medication  Sig  . acetaminophen (TYLENOL) 500 MG tablet Take 1,000 mg by mouth every 6 (six) hours as needed for headache (pain).  [name] albuterol (PROVENTIL HFA;VENTOLIN HFA) 108 (90 Base) MCG/ACT inhaler Inhale 2 puffs into the lungs every 6 (six) hours as needed for wheezing or shortness of breath.  [name] amitriptyline (ELAVIL) 100 MG tablet Take 1 tablet (100 mg total) by mouth at bedtime.  [name] apixaban (ELIQUIS) 5 MG TABS tablet Take 1 tablet (5 mg total) by mouth 2 (two) times daily for 15 days.  . bictegravir-emtricitabine-tenofovir AF (BIKTARVY) 50-200-25 MG TABS tablet Take 1 tablet by mouth daily at 3 pm.   . budesonide-formoterol (SYMBICORT) 80-4.5 MCG/ACT inhaler Inhale 2 puffs into the lungs 2 (two) times daily.  [name] diltiazem (CARDIZEM CD) 360 MG 24 hr capsule Take 360 mg by mouth daily.  [name] gabapentin (NEURONTIN) 300 MG capsule Take 1 capsule (300 mg total) by mouth 3 (three) times daily.  . Insulin Detemir (LEVEMIR) 100 UNIT/ML Pen Inject 30 Units into the skin daily at 10 pm.  . metFORMIN (GLUCOPHAGE) 1000 MG tablet Take 1 tablet (1,000 mg total) by mouth daily with breakfast.  . oxyCODONE (OXY IR/ROXICODONE) 5 MG immediate release tablet Take 5 mg by mouth 3 (three) times daily as needed (pain).   . OXYGEN Inhale 2 L into the lungs as needed (shortness of breath).  . Tiotropium Bromide Monohydrate (SPIRIVA RESPIMAT) 2.5 MCG/ACT AERS Inhale 2 puffs into the lungs daily.   . Blood Glucose Monitoring Suppl (ONETOUCH VERIO) w/Device KIT 1 kit by Does not apply route 2 (two) times daily.  [name] glucose blood (ONETOUCH VERIO) test strip Use as instructed  . Insulin Pen Needle (B-D UF III MINI PEN NEEDLES) 31G X 5 MM MISC Use as instructed  . Misc. Devices MISC Please provide patient with a portable oximeter. Diagnosis: COPD GOLD III  . ONETOUCH DELICA LANCETS 32K MISC Use as instructed  . umeclidinium bromide (INCRUSE ELLIPTA) 62.5 MCG/INH AEPB Inhale 1 puff into the lungs daily.  . [DISCONTINUED] insulin  glargine (LANTUS) 100 UNIT/ML injection Inject 0.2 mLs (20 Units total) into the skin 2 (two) times daily. (Patient taking differently: Inject 30 Units into the skin daily. )    FAMILY HISTORY:  His indicated that his mother is deceased. He indicated that his father is deceased. He indicated that his maternal grandmother is alive. He indicated that his maternal grandfather is deceased. He indicated that only one of his two maternal aunts is alive. He indicated that only one of his two maternal uncles is alive.   SOCIAL HISTORY: He  reports that he has been smoking cigarettes.  He has a 4.00 pack-year smoking history. He has never used smokeless tobacco. He reports that he does not drink alcohol or use drugs.  REVIEW OF SYSTEMS:   General: No sick exposure, no fevers, no chills, no weight changes.  HEENT: Denies headache, sore throat, nasal discharge.  Pulmonary: Positive for cough productive of white sputum.  Increasing wheezing.  Increases shortness of breath.  Denies chest pain.  Cardiac: Denies palpitations or chest pain.  Abdomen: Denies nausea vomiting diarrhea change in appetite.  GU: Denies any changes.  Neuro: Denies any dizziness, gait disturbance, memory changes.  SUBJECTIVE:  Very short of breath  VITAL SIGNS: Blood Pressure (Abnormal) 162/81 (BP Location: Right Arm)   Pulse (Abnormal) 106   Temperature 98 F (36.7 C) (Oral)   Respiration (Abnormal) 22  Height '5\' 9"'  (1.753 m)   Weight 243 lb (110.2 kg)   Oxygen Saturation 93%   Body Mass Index 35.88 kg/m    HEMODYNAMICS:    VENTILATOR SETTINGS: Vent Mode: BIPAP FiO2 (%):  [32 %-50 %] 50 % Set Rate:  [12 bmp] 12 bmp PEEP:  [5 cmH20] 5 cmH20  INTAKE / OUTPUT: I/O last 3 completed shifts: In: -  Out: 2250 [Urine:2250]  PHYSICAL EXAMINATION: General: 52 year old male patient currently in acute distress with marked accessory use  Neuro: Awake, alert, cooperative however argumentative in regards to his plan of care  no focal motor deficits HEENT: [place] atraumatic.  No jugular venous distention.  Marked audible upper airway wheezing.  Trach stoma healed.  Oral cavity with white discoloration over tongue and posterior pharynx.  Also residual food particles Cardiovascular: Regular rate and rhythm sinus rhythm on telemetry currently  lungs: Transmitted upper airway wheezing.  Marked accessory use Abdomen: Soft, nontender, no organomegaly, positive bowel sounds Musculoskeletal: Equal strength and bulk Skin: Warm and dry trace lower extremity edema  LABS:  BMET Recent Labs  Lab 12/14/17 1526 12/14/17 1550 12/15/17 0602  NA 139 141 138  K 4.0 3.9 5.5*  CL 103 103 101  CO2 27  --  28  BUN '12 14 20  ' CREATININE 1.29* 1.20 1.32*  GLUCOSE 131* 129* 178*    Electrolytes Recent Labs  Lab 12/14/17 1526 12/15/17 0602  CALCIUM 9.0 9.5    CBC Recent Labs  Lab 12/14/17 1526 12/14/17 1550 12/15/17 0602  WBC 8.9  --  10.6*  HGB 11.6* 12.6* 11.6*  HCT 37.1* 37.0* 38.0*  PLT 259  --  260    Coag's No results for input(s): APTT, INR in the last 168 hours.  Sepsis Markers No results for input(s): LATICACIDVEN, PROCALCITON, O2SATVEN in the last 168 hours.  ABG Recent Labs  Lab 12/14/17 1512 12/14/17 2135  PHART 7.274* 7.278*  PCO2ART 57.9* 49.7*  PO2ART 74.0* 72.3*    Liver Enzymes Recent Labs  Lab 12/14/17 1526  AST 21  ALT 20  ALKPHOS 46  BILITOT 0.3  ALBUMIN 3.4*    Cardiac Enzymes Recent Labs  Lab 12/15/17 0012 12/15/17 0602  TROPONINI <0.03 <0.03    Glucose Recent Labs  Lab 12/14/17 2058 12/15/17 0033 12/15/17 0410  GLUCAP 302* 254* 181*    Imaging Dg Chest Port 1 View  Result Date: 12/14/2017 CLINICAL DATA:  SOB. Hx COPD with chronic home o2 use. Hx CHF, DM, HTN. EXAM: PORTABLE CHEST 1 VIEW COMPARISON:  10/20/2017 FINDINGS: Cardiac silhouette is mildly enlarged.  No mediastinal or masses. There is prominence of the bronchovascular markings lung base  interstitial thickening, similar to the prior exam. No convincing pneumonia or pulmonary edema. No pleural effusion or pneumothorax. A possible right internal jugular central venous line not well-defined on this image. The possible tip projects in the region of the upper superior vena cava. Skeletal structures are grossly intact. IMPRESSION: No acute cardiopulmonary disease. Electronically Signed   By: Lajean Manes M.D.   On: 12/14/2017 15:58     STUDIES:    CULTURES:   ANTIBIOTICS: Doxy 5/6>>> Fluconazole 5/7>>>  SIGNIFICANT EVENTS:   LINES/TUBES:   DISCUSSION: 52 year old male patient with history of chronic respiratory failure in the setting of stage III chronic obstructive pulmonary disease and ongoing tobacco abuse.  Recently discharged after a prolonged hospitalization due to influenza pneumonia, resulting in ARDS, prolonged ventilator dependence, and ultimately tracheostomy as well as dialysis requirement.  Now  readmitted with what appears to be acute exacerbation of chronic obstructive pulmonary disease further complicated by laryngopharyngeal reflux component as evidenced by loud upper airway wheeze.  We will place him on noninvasive ventilation, continue scheduled bronchodilators, continue systemic steroids, add PPI, also change him to IV fluconazole.  His CD4 count was remarkably lower than times in the past however his viral load was still less than 20.  We will repeat this.  He is at very high risk for intubation.  If intubated he would benefit from bronchoscopy with bronchial alveolar lavage   ASSESSMENT / PLAN:  Acute on chronic hypoxic and hypercarbic respiratory failure in the setting of acute exacerbation of chronic obstructive pulmonary disease and what appears to be significant component of laryngal pharyngeal reflux disease as evidenced by marked upper airway wheezing.  -oral candidiasis -Ongoing tobacco abuse -I think upper airway component is likely a large  contributing factor to his acute exacerbation.  Doubt active smoking is helping.  Also given his recent tracheostomy I wonder about tracheal stenosis potentially contributing to his upper airway burden Plan Transfer to the intensive care Repeat stat chest x-ray Place on continuous BiPAP Add IV fluconazole Continue doxy, today's day 2. Continue scheduled bronchodilators Discontinue inhaled corticosteroids given active thrush infection Continue systemic steroids Close observation, he is at high risk for intubation N.p.o. except for meds He needs to stop smoking  History of chronic atrial fibrillation and chronic diastolic heart failure. History of hypertension Is actually currently in sinus rhythm Plan Continue telemetry monitoring Repeating chest x-ray as mentioned above Continue rate control Continue apixaban  Acute on chronic renal failure with recent AKI dialysis -Renal function actually has been looking good.  Did notice a small bump following diuresis yesterday Plan Renal dose medications Hold further diuresis for now Follow-up a.m. chemistry  Mild hyperkalemia  Plan Repeat   Anemia of chronic disease without evidence of bleeding Plan Monitor  History of HIV.  Had been adherent with his medical regimen, however his last CD4 count was 40, but his viral load was less than 20. Plan Continue his HIV regimen Check CD4 and HIV viral load  Diabetes, poorly controlled with hyperglycemia Plan Sliding scale insulin  History of anxiety and depression Plan Supportive care  DVT prophylaxis: On apixaban SUP: Adding PPI Diet: N.p.o. Activity: Bedrest Disposition : ICU with high risk of intubation   FAMILY  - Updates:   - Inter-disciplinary family meet or Palliative Care meeting due by: 5/17  Critical care time 45 minutes  Erick Colace ACNP-BC [place] Pager # [phone] OR # [phone] if no answer  12/15/2017, 8:44 AM

## 2017-12-15 NOTE — Procedures (Signed)
OGT Placed Under Direct laryngoscopy   Placed and confirmed by auscultation and CXR  Alyson Reedy, M.D. [facility] Pulmonary/Critical Care Medicine. Pager: [phone]. After hours pager: [phone].

## 2017-12-15 NOTE — Progress Notes (Signed)
This note also relates to the following rows which could not be included: SpO2 - Cannot attach notes to unvalidated device data  PEEP increased to 10 per Dr. Molli Knock due to patient desating. Vitals are stable. RT will continue to monitor.

## 2017-12-15 NOTE — Procedures (Signed)
Bronchoscopy Procedure Note Hyland Mollenkopf [id number] Sep 13, 1965  Procedure: Bronchoscopy Indications: Diagnostic evaluation of the airways, Obtain specimens for culture and/or other diagnostic studies and Remove secretions  Procedure Details Consent: Risks of procedure as well as the alternatives and risks of each were explained to the (patient/caregiver).  Consent for procedure obtained. Time Out: Verified patient identification, verified procedure, site/side was marked, verified correct patient position, special equipment/implants available, medications/allergies/relevent history reviewed, required imaging and test results available.  Performed  In preparation for procedure, patient was given 100% FiO2 and bronchoscope lubricated. Sedation: Benzodiazepines, Muscle relaxants, Etomidate and Fentanyl  Airway entered and the following bronchi were examined: RUL, RML, RLL, LUL, LLL and Bronchi.   BAL from RLL Bronchoscope removed.  , Patient placed back on 100% FiO2 at conclusion of procedure.    Evaluation Hemodynamic Status: BP stable throughout; O2 sats: stable throughout Patient's Current Condition: stable Specimens:  Sent purulent fluid Complications: No apparent complications Patient did tolerate procedure well.   Jennet Maduro 12/15/2017

## 2017-12-15 NOTE — Consult Note (Signed)
[facility] for Infectious Disease    Date of Admission:  12/14/2017   Total days of antibiotics: 2 doxy               Reason for Consult: AIDS, Respiratory failure    Referring Provider: Nelda Marseille   Assessment: AIDS VDRF Thrush, Pulmonary candidiasis COPD gold 3 CKD2  Plan: 1. Change biktarvy to tivicay-truvada 2. Recheck his CD4 and HIV RNA 3. IV fluconazole, '400mg'$  daily 4. Check PCP DFA 5. Stop doxy 6. Start IV dapsone/trimethoprim  Comment- Studies have shown that with CD4 100-200 and HIV RNA undetectable, risk is low for getting PCP. Once CD4 is < 100, risk increases again.  Alternatively, this could be severe fungal/candidal infection, or worsened COPD.   Thank you so much for this interesting consult,  Principal Problem:   COPD exacerbation ([place]) Active Problems:   Obstructive sleep apnea syndrome   Essential hypertension, benign   Cigarette nicotine dependence without complication   GERD (gastroesophageal reflux disease)   Acute on chronic respiratory failure with hypoxia (HCC)   PAF (paroxysmal atrial fibrillation) ([place])   Diabetes mellitus type 2 in obese ([place])   HIV (human immunodeficiency virus infection) ([place])   . amitriptyline  100 mg Per Tube QHS  . apixaban  5 mg Per Tube BID  . bictegravir-emtricitabine-tenofovir AF  1 tablet Oral Q1500  . diltiazem  360 mg Oral Daily  . doxycycline  100 mg Oral Q12H  . insulin aspart  0-15 Units Subcutaneous Q4H  . insulin detemir  30 Units Subcutaneous QHS  . ipratropium  0.5 mg Nebulization Q6H  . levalbuterol  0.63 mg Nebulization Q6H  . methylPREDNISolone (SOLU-MEDROL) injection  60 mg Intravenous Q8H  . pantoprazole sodium  40 mg Per Tube Q1200  . sodium chloride flush  3 mL Intravenous Q12H  . sodium polystyrene  30 g Per Tube Once    HPI: Chris [name]. is a 52 y.o. male with hx of AIDS, COPD gold 3, DM2, hospitalized 10-01-17 --> 10-29-17 with influenza giving rise to ARDS, ARF  (temporary HD), tracheostomy, d/c to LTAC. He was decanulated.  He returns to ED on 5-6 with 3 days of dyspnea, prod cough (white sputum). He was started on BiPAP (tolerated poorly), steroids, nebs, lasix. His CXR did not show infiltrate. He was started on doxy.  By 5-7 he had respiratory failure and required intubation. He underwent bronchoscopy which showed diffuse white plaques.   HIV 1 RNA Quant (copies/mL)  Date Value  10/26/2017 <20  09/25/2017 22 (H)  05/19/2017 <20 NOT DETECTED   CD4 T Cell Abs (/uL)  Date Value  10/26/2017 40 (L)  09/25/2017 430  05/19/2017 380 (L)    Review of Systems: Review of Systems  Unable to perform ROS: Intubated    Past Medical History:  Diagnosis Date  . Anxiety   . Atrial fibrillation ([place])   . Atrial fibrillation ([place])   . CHF (congestive heart failure) ([place])   . COPD (chronic obstructive pulmonary disease) ([place])    Emphysema [[name]]  . Depression   . Diabetes mellitus without complication ([place])   . Emphysema of lung ([place])   . GERD (gastroesophageal reflux disease)   . HIV disease ([place])   . Hypertension   . Oxygen deficiency     Social History   Tobacco Use  . Smoking status: Current Some Day Smoker    Packs/day: 0.10    Years: 40.00  Pack years: 4.00    Types: Cigarettes  . Smokeless tobacco: Never Used  . Tobacco comment: smokes 3/ day since d/c from hospital  Substance Use Topics  . Alcohol use: No    Alcohol/week: 0.0 oz  . Drug use: No    Comment: quit 04    Family History  Problem Relation Age of Onset  . Throat cancer Father 42  . Emphysema Maternal Uncle        was a smoker  . Diabetes Maternal Uncle   . Heart disease Maternal Uncle   . COPD Maternal Uncle   . Asthma Maternal Uncle   . Diabetes Maternal Uncle   . Asthma Maternal Aunt   . Diabetes Maternal Aunt   . Heart disease Maternal Aunt   . Throat cancer Maternal Grandmother        never smoker, used snuff  . Diabetes Maternal Grandmother   .  Breast cancer Maternal Aunt      Medications:  Scheduled: . amitriptyline  100 mg Per Tube QHS  . apixaban  5 mg Per Tube BID  . bictegravir-emtricitabine-tenofovir AF  1 tablet Oral Q1500  . diltiazem  360 mg Oral Daily  . doxycycline  100 mg Oral Q12H  . insulin aspart  0-15 Units Subcutaneous Q4H  . insulin detemir  30 Units Subcutaneous QHS  . ipratropium  0.5 mg Nebulization Q6H  . levalbuterol  0.63 mg Nebulization Q6H  . methylPREDNISolone (SOLU-MEDROL) injection  60 mg Intravenous Q8H  . pantoprazole sodium  40 mg Per Tube Q1200  . sodium chloride flush  3 mL Intravenous Q12H  . sodium polystyrene  30 g Per Tube Once    Abtx:  Anti-infectives (From admission, onward)   Start     Dose/Rate Route Frequency Ordered Stop   12/15/17 1100  fluconazole (DIFLUCAN) IVPB 400 mg     400 mg 100 mL/hr over 120 Minutes Intravenous Every 24 hours 12/15/17 0923     12/14/17 1900  doxycycline (VIBRA-TABS) tablet 100 mg     100 mg Oral Every 12 hours 12/14/17 1822     12/14/17 1830  bictegravir-emtricitabine-tenofovir AF (BIKTARVY) 50-200-25 MG per tablet 1 tablet     1 tablet Oral Daily 12/14/17 1819          OBJECTIVE: Blood pressure (!) 152/83, pulse (!) 110, temperature (!) 96.7 F (35.9 C), temperature source Axillary, resp. rate (!) 25, height '5\' 9"'$  (1.753 m), weight 110.2 kg (243 lb), SpO2 100 %.  Physical Exam  Constitutional: He appears well-developed and well-nourished. No distress.  Eyes: Pupils are equal, round, and reactive to light.  Neck: Neck supple.  Cardiovascular: Normal rate and regular rhythm.  Pulmonary/Chest: He has wheezes. He has rhonchi.  Abdominal: Soft. Bowel sounds are normal. He exhibits distension. There is no tenderness.  Musculoskeletal: He exhibits edema.  Lymphadenopathy:    He has no cervical adenopathy.    Lab Results Results for orders placed or performed during the hospital encounter of 12/14/17 (from the past 48 hour(s))  Brain  natriuretic peptide     Status: None   Collection Time: 12/14/17  2:40 PM  Result Value Ref Range   B Natriuretic Peptide 73.6 0.0 - 100.0 pg/mL    Comment: Performed at [facility], [address], [place], [place] [address]  I-Stat arterial blood gas, ED     Status: Abnormal   Collection Time: 12/14/17  3:12 PM  Result Value Ref Range   pH, Arterial  7.274 (L) 7.350 - 7.450   pCO2 arterial 57.9 (H) 32.0 - 48.0 mmHg   pO2, Arterial 74.0 (L) 83.0 - 108.0 mmHg   Bicarbonate 26.8 20.0 - 28.0 mmol/L   TCO2 28 22 - 32 mmol/L   O2 Saturation 92.0 %   Acid-base deficit 1.0 0.0 - 2.0 mmol/L   Patient temperature 98.8 F    Collection site RADIAL, ALLEN'S TEST ACCEPTABLE    Drawn by RT    Sample type ARTERIAL   CBC with Differential/Platelet     Status: Abnormal   Collection Time: 12/14/17  3:26 PM  Result Value Ref Range   WBC 8.9 4.0 - 10.5 K/uL   RBC 3.91 (L) 4.22 - 5.81 MIL/uL   Hemoglobin 11.6 (L) 13.0 - 17.0 g/dL   HCT 37.1 (L) 39.0 - 52.0 %   MCV 94.9 78.0 - 100.0 fL   MCH 29.7 26.0 - 34.0 pg   MCHC 31.3 30.0 - 36.0 g/dL   RDW 14.8 11.5 - 15.5 %   Platelets 259 150 - 400 K/uL   Neutrophils Relative % 70 %   Neutro Abs 6.3 1.7 - 7.7 K/uL   Lymphocytes Relative 20 %   Lymphs Abs 1.8 0.7 - 4.0 K/uL   Monocytes Relative 6 %   Monocytes Absolute 0.5 0.1 - 1.0 K/uL   Eosinophils Relative 3 %   Eosinophils Absolute 0.2 0.0 - 0.7 K/uL   Basophils Relative 1 %   Basophils Absolute 0.0 0.0 - 0.1 K/uL    Comment: Performed at [facility], [address], [place], [place] [address]  Comprehensive metabolic panel     Status: Abnormal   Collection Time: 12/14/17  3:26 PM  Result Value Ref Range   Sodium 139 135 - 145 mmol/L   Potassium 4.0 3.5 - 5.1 mmol/L   Chloride 103 101 - 111 mmol/L   CO2 27 22 - 32 mmol/L   Glucose, Bld 131 (H) 65 - 99 mg/dL   BUN 12 6 - 20 mg/dL   Creatinine, Ser 1.29 (H) 0.61 - 1.24 mg/dL   Calcium 9.0 8.9 - 10.3 mg/dL   Total Protein 7.7 6.5  - 8.1 g/dL   Albumin 3.4 (L) 3.5 - 5.0 g/dL   AST 21 15 - 41 U/L   ALT 20 17 - 63 U/L   Alkaline Phosphatase 46 38 - 126 U/L   Total Bilirubin 0.3 0.3 - 1.2 mg/dL   GFR calc non Af Amer >60 >60 mL/min   GFR calc Af Amer >60 >60 mL/min    Comment: (NOTE) The eGFR has been calculated using the CKD EPI equation. This calculation has not been validated in all clinical situations. eGFR's persistently <60 mL/min signify possible Chronic Kidney Disease.    Anion gap 9 5 - 15    Comment: Performed at [facility] [address], [place], [place] [address]  I-stat troponin, ED     Status: None   Collection Time: 12/14/17  3:48 PM  Result Value Ref Range   Troponin i, poc 0.00 0.00 - 0.08 ng/mL   Comment 3            Comment: Due to the release kinetics of cTnI, a negative result within the first hours of the onset of symptoms does not rule out myocardial infarction with certainty. If myocardial infarction is still suspected, repeat the test at appropriate intervals.   I-stat chem 8, ed     Status: Abnormal  Collection Time: 12/14/17  3:50 PM  Result Value Ref Range   Sodium 141 135 - 145 mmol/L   Potassium 3.9 3.5 - 5.1 mmol/L   Chloride 103 101 - 111 mmol/L   BUN 14 6 - 20 mg/dL   Creatinine, Ser 1.20 0.61 - 1.24 mg/dL   Glucose, Bld 129 (H) 65 - 99 mg/dL   Calcium, Ion 1.16 1.15 - 1.40 mmol/L   TCO2 27 22 - 32 mmol/L   Hemoglobin 12.6 (L) 13.0 - 17.0 g/dL   HCT 37.0 (L) 39.0 - 52.0 %  Glucose, capillary     Status: Abnormal   Collection Time: 12/14/17  8:58 PM  Result Value Ref Range   Glucose-Capillary 302 (H) 65 - 99 mg/dL  Blood gas, arterial     Status: Abnormal   Collection Time: 12/14/17  9:35 PM  Result Value Ref Range   O2 Content 6.0 L/min   Delivery systems NASAL CANNULA    pH, Arterial 7.278 (L) 7.350 - 7.450   pCO2 arterial 49.7 (H) 32.0 - 48.0 mmHg   pO2, Arterial 72.3 (L) 83.0 - 108.0 mmHg   Bicarbonate 22.7 20.0 - 28.0 mmol/L   Acid-base deficit  3.2 (H) 0.0 - 2.0 mmol/L   O2 Saturation 93.8 %   Patient temperature 97.4    Collection site RIGHT RADIAL    Drawn by [phone]    Sample type ARTERIAL DRAW    Allens test (pass/fail) PASS PASS  Troponin I     Status: None   Collection Time: 12/15/17 12:12 AM  Result Value Ref Range   Troponin I <0.03 <0.03 ng/mL    Comment: Performed at [facility], [place] [address] [place], [place] [address]  Glucose, capillary     Status: Abnormal   Collection Time: 12/15/17 12:33 AM  Result Value Ref Range   Glucose-Capillary 254 (H) 65 - 99 mg/dL  Glucose, capillary     Status: Abnormal   Collection Time: 12/15/17  4:10 AM  Result Value Ref Range   Glucose-Capillary 181 (H) 65 - 99 mg/dL  Troponin I     Status: None   Collection Time: 12/15/17  6:02 AM  Result Value Ref Range   Troponin I <0.03 <0.03 ng/mL    Comment: Performed at [facility], [place] [address] [place], [place] [address]  Basic metabolic panel     Status: Abnormal   Collection Time: 12/15/17  6:02 AM  Result Value Ref Range   Sodium 138 135 - 145 mmol/L   Potassium 5.5 (H) 3.5 - 5.1 mmol/L    Comment: DELTA CHECK NOTED   Chloride 101 101 - 111 mmol/L   CO2 28 22 - 32 mmol/L   Glucose, Bld 178 (H) 65 - 99 mg/dL   BUN 20 6 - 20 mg/dL   Creatinine, Ser 1.32 (H) 0.61 - 1.24 mg/dL   Calcium 9.5 8.9 - 10.3 mg/dL   GFR calc non Af Amer >60 >60 mL/min   GFR calc Af Amer >60 >60 mL/min    Comment: (NOTE) The eGFR has been calculated using the CKD EPI equation. This calculation has not been validated in all clinical situations. eGFR's persistently <60 mL/min signify possible Chronic Kidney Disease.    Anion gap 9 5 - 15    Comment: Performed at [facility] [address] [place], [place] [address]  CBC     Status: Abnormal   Collection Time: 12/15/17  6:02 AM  Result Value  Ref Range   WBC 10.6 (H) 4.0 - 10.5 K/uL   RBC 4.00 (L) 4.22 - 5.81 MIL/uL   Hemoglobin 11.6 (L) 13.0 - 17.0 g/dL   HCT 38.0 (L)  39.0 - 52.0 %   MCV 95.0 78.0 - 100.0 fL   MCH 29.0 26.0 - 34.0 pg   MCHC 30.5 30.0 - 36.0 g/dL   RDW 14.2 11.5 - 15.5 %   Platelets 260 150 - 400 K/uL    Comment: Performed at [facility] [address], [place], [place] [address]  Glucose, capillary     Status: Abnormal   Collection Time: 12/15/17  7:52 AM  Result Value Ref Range   Glucose-Capillary 170 (H) 65 - 99 mg/dL  MRSA PCR Screening     Status: None   Collection Time: 12/15/17  9:32 AM  Result Value Ref Range   MRSA by PCR NEGATIVE NEGATIVE    Comment:        The GeneXpert MRSA Assay (FDA approved for NASAL specimens only), is one component of a comprehensive MRSA colonization surveillance program. It is not intended to diagnose MRSA infection nor to guide or monitor treatment for MRSA infections. Performed at [facility], [place] [address] [place], [place] [address]   Troponin I     Status: None   Collection Time: 12/15/17  9:53 AM  Result Value Ref Range   Troponin I <0.03 <0.03 ng/mL    Comment: Performed at [facility] [address] [place], [place] [address]  Basic metabolic panel     Status: Abnormal   Collection Time: 12/15/17  9:53 AM  Result Value Ref Range   Sodium 138 135 - 145 mmol/L   Potassium 5.9 (H) 3.5 - 5.1 mmol/L   Chloride 100 (L) 101 - 111 mmol/L   CO2 30 22 - 32 mmol/L   Glucose, Bld 200 (H) 65 - 99 mg/dL   BUN 19 6 - 20 mg/dL   Creatinine, Ser 1.25 (H) 0.61 - 1.24 mg/dL   Calcium 9.5 8.9 - 10.3 mg/dL   GFR calc non Af Amer >60 >60 mL/min   GFR calc Af Amer >60 >60 mL/min    Comment: (NOTE) The eGFR has been calculated using the CKD EPI equation. This calculation has not been validated in all clinical situations. eGFR's persistently <60 mL/min signify possible Chronic Kidney Disease.    Anion gap 8 5 - 15    Comment: Performed at [facility] [address], [place], [place] [address]  Procalcitonin - Baseline     Status: None   Collection Time:  12/15/17  9:53 AM  Result Value Ref Range   Procalcitonin 0.10 ng/mL    Comment:        Interpretation: PCT (Procalcitonin) <= 0.5 ng/mL: Systemic infection (sepsis) is not likely. Local bacterial infection is possible. (NOTE)       Sepsis PCT Algorithm           Lower Respiratory Tract                                      Infection PCT Algorithm    ----------------------------     ----------------------------         PCT < 0.25 ng/mL                PCT < 0.10 ng/mL  Strongly encourage             Strongly discourage   discontinuation of antibiotics    initiation of antibiotics    ----------------------------     -----------------------------       PCT 0.25 - 0.50 ng/mL            PCT 0.10 - 0.25 ng/mL               OR       >80% decrease in PCT            Discourage initiation of                                            antibiotics      Encourage discontinuation           of antibiotics    ----------------------------     -----------------------------         PCT >= 0.50 ng/mL              PCT 0.26 - 0.50 ng/mL               AND        <80% decrease in PCT             Encourage initiation of                                             antibiotics       Encourage continuation           of antibiotics    ----------------------------     -----------------------------        PCT >= 0.50 ng/mL                  PCT > 0.50 ng/mL               AND         increase in PCT                  Strongly encourage                                      initiation of antibiotics    Strongly encourage escalation           of antibiotics                                     -----------------------------                                           PCT <= 0.25 ng/mL                                                 OR                                        >  80% decrease in PCT                                     Discontinue / Do not initiate                                              antibiotics Performed at [facility], [place] [address], [place], [place] [address]   Glucose, capillary     Status: Abnormal   Collection Time: 12/15/17 11:57 AM  Result Value Ref Range   Glucose-Capillary 191 (H) 65 - 99 mg/dL   Comment 1 Capillary Specimen       Component Value Date/Time   SDES BLOOD LEFT HAND 10/14/2017 0919   SPECREQUEST IN PEDIATRIC BOTTLE Blood Culture adequate volume 10/14/2017 0919   CULT  10/14/2017 0919    NO GROWTH 5 DAYS Performed at [facility], [place] [address] [place], [place] [address]    REPTSTATUS 10/19/2017 FINAL 10/14/2017 0919   Dg Chest Port 1 View  Result Date: 12/15/2017 CLINICAL DATA:  Endotracheal intubation EXAM: PORTABLE CHEST 1 VIEW COMPARISON:  Chest radiograph 12/15/2017 at 9:23 a.m. FINDINGS: Endotracheal tube tip is below the level of the clavicular heads approximately 3.2 cm above the carina. Orogastric tube courses below the diaphragm. Mediastinal silhouette and lungs are unchanged. Persistent bibasilar atelectasis. IMPRESSION: Endotracheal tube tip approximately 3.2 cm above the carina. Electronically Signed   By: Ulyses Jarred M.D.   On: 12/15/2017 15:17   Dg Chest Port 1 View  Result Date: 12/15/2017 CLINICAL DATA:  Onset of shortness of breath and acute respiratory distress today. History of COPD, CHF, HIV, current smoker. EXAM: PORTABLE CHEST 1 VIEW COMPARISON:  Chest x-ray of Dec 14, 2017 FINDINGS: The lungs remain mildly hyperinflated. The interstitial markings are coarse especially at the bases but have improved slightly since yesterday's study. The cardiac silhouette is enlarged. The central pulmonary vascularity is prominent. There is no pleural effusion. IMPRESSION: Slight interval improvement in the appearance of bibasilar atelectasis or pneumonia. Mild cardiomegaly with central pulmonary vascular congestion. Electronically Signed   By: David  Martinique M.D.   On: 12/15/2017 11:15   Dg Chest Port 1 View  Result Date:  12/14/2017 CLINICAL DATA:  SOB. Hx COPD with chronic home o2 use. Hx CHF, DM, HTN. EXAM: PORTABLE CHEST 1 VIEW COMPARISON:  10/20/2017 FINDINGS: Cardiac silhouette is mildly enlarged.  No mediastinal or masses. There is prominence of the bronchovascular markings lung base interstitial thickening, similar to the prior exam. No convincing pneumonia or pulmonary edema. No pleural effusion or pneumothorax. A possible right internal jugular central venous line not well-defined on this image. The possible tip projects in the region of the upper superior vena cava. Skeletal structures are grossly intact. IMPRESSION: No acute cardiopulmonary disease. Electronically Signed   By: Lajean Manes M.D.   On: 12/14/2017 15:58   Recent Results (from the past 240 hour(s))  MRSA PCR Screening     Status: None   Collection Time: 12/15/17  9:32 AM  Result Value Ref Range Status   MRSA by PCR NEGATIVE NEGATIVE Final    Comment:        The GeneXpert MRSA Assay (FDA approved for NASAL specimens only), is one component of a comprehensive MRSA colonization surveillance program.  It is not intended to diagnose MRSA infection nor to guide or monitor treatment for MRSA infections. Performed at [facility], [place] [address] [place], [place] [address]     Microbiology: Recent Results (from the past 240 hour(s))  MRSA PCR Screening     Status: None   Collection Time: 12/15/17  9:32 AM  Result Value Ref Range Status   MRSA by PCR NEGATIVE NEGATIVE Final    Comment:        The GeneXpert MRSA Assay (FDA approved for NASAL specimens only), is one component of a comprehensive MRSA colonization surveillance program. It is not intended to diagnose MRSA infection nor to guide or monitor treatment for MRSA infections. Performed at [facility], [place] [address], [place], [place] [address]     Radiographs and labs were personally reviewed by me.   Bobby Rumpf, MD [facility]  [facility] [phone] 12/15/2017, 3:23 PM

## 2017-12-15 NOTE — Care Management Note (Signed)
Case Management Note  Patient Details  Name: Chris [name]. MRN: [id number] Date of Birth: [birth year]  Subjective/Objective:   Pt admitted with acute resp failure  Action/Plan:  PTA from home with wife.  Pt recently discharged from [facility] - before that he was admitted here at [facility].     Expected Discharge Date:                  Expected Discharge Plan:     In-House Referral:  Clinical Social Work  Discharge planning Services  CM Consult  Post Acute Care Choice:    Choice offered to:     DME Arranged:    DME Agency:     HH Arranged:    HH Agency:     Status of Service:     If discussed at [employer], dates discussed:    Additional Comments:  Cherylann Parr, RN 12/15/2017, 1:50 PM

## 2017-12-15 NOTE — Progress Notes (Signed)
ID consulted and will see patient

## 2017-12-15 NOTE — Progress Notes (Signed)
RR increased to 30 and FIO2 decreased to 50% per MD per ABG results. Vitals are stable. RT will continue to monitor.

## 2017-12-15 NOTE — Procedures (Signed)
Intubation Procedure Note Aceson Labell [id number] [birth year]  Procedure: Intubation Indications: Respiratory insufficiency  Procedure Details Consent: Risks of procedure as well as the alternatives and risks of each were explained to the (patient/caregiver).  Consent for procedure obtained. Time Out: Verified patient identification, verified procedure, site/side was marked, verified correct patient position, special equipment/implants available, medications/allergies/relevent history reviewed, required imaging and test results available.  Performed  Maximum sterile technique was used including gloves, gown, hand hygiene and mask.  MAC    Evaluation Hemodynamic Status: BP stable throughout; O2 sats: stable throughout Patient's Current Condition: stable Complications: No apparent complications Patient did tolerate procedure well. Chest X-ray ordered to verify placement.  CXR: pending.   Jennet Maduro 12/15/2017

## 2017-12-15 NOTE — Progress Notes (Signed)
Pharmacy Antibiotic Note  Chris [name]. is a 52 y.o. male admitted on 12/14/2017 with dyspnea, likely COPD exacerbation. Transferred to the ICU for severe respiratory distress, almost requiring intubation. Pt is on doxycyline for COPD exac, planning to start fluconazole for oral candidiasis. Pt also has HIV ?imunocompromised, last CD4 40, HIV RNA undetectable.   Plan: -Doxycyline 100 mg IV q12h -Fluconazole 400 mg IV q24h -Monitor for improvement in thrush   Height:  (175.3 cm) Weight: 243 lb (110.2 kg) IBW/kg (Calculated) : 70.7  Temp (24hrs), Avg:97.9 F (36.6 C), Min:97.4 F (36.3 C), Max:98.8 F (37.1 C)  Recent Labs  Lab 12/14/17 1526 12/14/17 1550 12/15/17 0602  WBC 8.9  --  10.6*  CREATININE 1.29* 1.20 1.32*    Estimated Creatinine Clearance: 81 mL/min (A) (by C-G formula based on SCr of 1.32 mg/dL (H)).    Allergies  Allergen Reactions  . Bactrim [Sulfamethoxazole-Trimethoprim] Hives    Antimicrobials this admission: 5/6 doxycycline > 5/7 fluconazole >  Dose adjustments this admission: N/A  Microbiology results: 5/7 mrsa pcr: sent   [name] 12/15/2017 9:23 AM

## 2017-12-16 ENCOUNTER — Inpatient Hospital Stay (HOSPITAL_COMMUNITY): Payer: Medicare HMO

## 2017-12-16 DIAGNOSIS — G9340 Encephalopathy, unspecified: Secondary | ICD-10-CM

## 2017-12-16 DIAGNOSIS — R140 Abdominal distension (gaseous): Secondary | ICD-10-CM

## 2017-12-16 DIAGNOSIS — J441 Chronic obstructive pulmonary disease with (acute) exacerbation: Principal | ICD-10-CM

## 2017-12-16 DIAGNOSIS — R0902 Hypoxemia: Secondary | ICD-10-CM

## 2017-12-16 DIAGNOSIS — R000 Tachycardia, unspecified: Secondary | ICD-10-CM

## 2017-12-16 DIAGNOSIS — E1169 Type 2 diabetes mellitus with other specified complication: Secondary | ICD-10-CM

## 2017-12-16 DIAGNOSIS — E669 Obesity, unspecified: Secondary | ICD-10-CM

## 2017-12-16 LAB — GLUCOSE, CAPILLARY
GLUCOSE-CAPILLARY: 197 mg/dL — AB (ref 65–99)
GLUCOSE-CAPILLARY: 178 mg/dL — AB (ref 65–99)
GLUCOSE-CAPILLARY: 257 mg/dL — AB (ref 65–99)
Glucose-Capillary: 166 mg/dL — ABNORMAL HIGH (ref 65–99)
Glucose-Capillary: 203 mg/dL — ABNORMAL HIGH (ref 65–99)
Glucose-Capillary: 177 mg/dL — ABNORMAL HIGH (ref 65–99)
Glucose-Capillary: 223 mg/dL — ABNORMAL HIGH (ref 65–99)

## 2017-12-16 LAB — BASIC METABOLIC PANEL
Anion gap: 10 (ref 5–15)
Anion gap: 8 (ref 5–15)
BUN: 45 mg/dL — ABNORMAL HIGH (ref 6–20)
BUN: 55 mg/dL — AB (ref 6–20)
CALCIUM: 9.3 mg/dL (ref 8.9–10.3)
CHLORIDE: 105 mmol/L (ref 101–111)
CO2: 27 mmol/L (ref 22–32)
CO2: 28 mmol/L (ref 22–32)
CREATININE: 2.24 mg/dL — AB (ref 0.61–1.24)
Calcium: 9.4 mg/dL (ref 8.9–10.3)
Chloride: 102 mmol/L (ref 101–111)
Creatinine, Ser: 2.18 mg/dL — ABNORMAL HIGH (ref 0.61–1.24)
GFR calc non Af Amer: 32 mL/min — ABNORMAL LOW (ref >60)
GFR, EST AFRICAN AMERICAN: 39 mL/min — AB (ref >60)
GFR, EST AFRICAN AMERICAN: 37 mL/min — AB (ref >60)
GFR, EST NON AFRICAN AMERICAN: 33 mL/min — AB (ref >60)
Glucose, Bld: 184 mg/dL — ABNORMAL HIGH (ref 65–99)
Glucose, Bld: 190 mg/dL — ABNORMAL HIGH (ref 65–99)
POTASSIUM: 5.5 mmol/L — AB (ref 3.5–5.1)
POTASSIUM: 5.2 mmol/L — AB (ref 3.5–5.1)
SODIUM: 141 mmol/L (ref 135–145)
Sodium: 139 mmol/L (ref 135–145)

## 2017-12-16 LAB — CBC
HEMATOCRIT: 33.8 % — AB (ref 39.0–52.0)
HEMOGLOBIN: 10.2 g/dL — AB (ref 13.0–17.0)
MCH: 29 pg (ref 26.0–34.0)
MCHC: 30.2 g/dL (ref 30.0–36.0)
MCV: 96 fL (ref 78.0–100.0)
Platelets: 250 10*3/uL (ref 150–400)
RBC: 3.52 MIL/uL — AB (ref 4.22–5.81)
RDW: 14.5 % (ref 11.5–15.5)
WBC: 14.7 10*3/uL — ABNORMAL HIGH (ref 4.0–10.5)

## 2017-12-16 LAB — HIV 1/2 AB DIFFERENTIATION
HIV 1 AB: POSITIVE — AB
HIV 2 Ab: NEGATIVE

## 2017-12-16 LAB — HIV ANTIBODY (ROUTINE TESTING W REFLEX): HIV Screen 4th Generation wRfx: REACTIVE — AB

## 2017-12-16 LAB — T-HELPER CELLS (CD4) COUNT (NOT AT ARMC)
CD4 % Helper T Cell: 7 % — ABNORMAL LOW (ref 33–55)
CD4 T Cell Abs: 60 /uL — ABNORMAL LOW (ref 400–2700)

## 2017-12-16 MED ORDER — DOCUSATE SODIUM 50 MG/5ML PO LIQD
100.0000 mg | Freq: Two times a day (BID) | ORAL | Status: DC
  Administered 2017-12-16 – 2017-12-25 (×17): 100 mg
  Filled 2017-12-16 (×27): qty 10

## 2017-12-16 MED ORDER — AMITRIPTYLINE HCL 25 MG PO TABS: 25.0000 mg | ORAL_TABLET | Freq: Every day | ORAL | Status: DC

## 2017-12-16 MED ORDER — PRO-STAT SUGAR FREE PO LIQD
30.0000 mL | Freq: Four times a day (QID) | ORAL | Status: DC
  Administered 2017-12-16 – 2017-12-20 (×15): 30 mL
  Filled 2017-12-16 (×17): qty 30

## 2017-12-16 MED ORDER — SODIUM POLYSTYRENE SULFONATE 15 GM/60ML PO SUSP
15.0000 g | Freq: Once | ORAL | Status: AC
  Administered 2017-12-16: 15 g
  Filled 2017-12-16: qty 60

## 2017-12-16 MED ORDER — VITAL HIGH PROTEIN PO LIQD
1000.0000 mL | ORAL | Status: DC
  Administered 2017-12-16 – 2017-12-19 (×4): 1000 mL

## 2017-12-16 MED ORDER — SODIUM CHLORIDE 0.9 % IV SOLN
INTRAVENOUS | Status: DC
  Administered 2017-12-16: 19:00:00 via INTRAVENOUS

## 2017-12-16 MED ORDER — SODIUM POLYSTYRENE SULFONATE 15 GM/60ML PO SUSP
60.0000 g | Freq: Once | ORAL | Status: AC
  Administered 2017-12-16: 60 g
  Filled 2017-12-16: qty 240

## 2017-12-16 MED ORDER — MAGNESIUM HYDROXIDE 400 MG/5ML PO SUSP
15.0000 mL | Freq: Every day | ORAL | Status: DC
  Administered 2017-12-17: 15 mL
  Filled 2017-12-16: qty 30

## 2017-12-16 MED ORDER — FENTANYL CITRATE (PF) 100 MCG/2ML IJ SOLN
50.0000 ug | Freq: Once | INTRAMUSCULAR | Status: AC
  Administered 2017-12-16: 50 ug via INTRAVENOUS
  Filled 2017-12-16: qty 2

## 2017-12-16 MED ORDER — FENTANYL BOLUS VIA INFUSION
50.0000 ug | INTRAVENOUS | Status: DC | PRN
  Administered 2017-12-16 – 2017-12-20 (×12): 50 ug via INTRAVENOUS
  Filled 2017-12-16: qty 50

## 2017-12-16 MED ORDER — AMITRIPTYLINE HCL 50 MG PO TABS
50.0000 mg | ORAL_TABLET | Freq: Every day | ORAL | Status: DC
  Administered 2017-12-16: 50 mg
  Filled 2017-12-16: qty 1

## 2017-12-16 MED ORDER — DEXMEDETOMIDINE HCL IN NACL 400 MCG/100ML IV SOLN
0.0000 ug/kg/h | INTRAVENOUS | Status: DC
  Administered 2017-12-16: 0.8 ug/kg/h via INTRAVENOUS
  Administered 2017-12-16: 1.2 ug/kg/h via INTRAVENOUS
  Administered 2017-12-16: 0.8 ug/kg/h via INTRAVENOUS
  Administered 2017-12-16: 0.4 ug/kg/h via INTRAVENOUS
  Administered 2017-12-17: 1 ug/kg/h via INTRAVENOUS
  Administered 2017-12-17: 0.8 ug/kg/h via INTRAVENOUS
  Administered 2017-12-17 (×2): 1 ug/kg/h via INTRAVENOUS
  Administered 2017-12-17: 1.2 ug/kg/h via INTRAVENOUS
  Administered 2017-12-17 – 2017-12-18 (×4): 1 ug/kg/h via INTRAVENOUS
  Administered 2017-12-18: 1.2 ug/kg/h via INTRAVENOUS
  Administered 2017-12-18 (×2): 1 ug/kg/h via INTRAVENOUS
  Administered 2017-12-18: 1.1 ug/kg/h via INTRAVENOUS
  Administered 2017-12-18: 1 ug/kg/h via INTRAVENOUS
  Administered 2017-12-19 (×2): 1.2 ug/kg/h via INTRAVENOUS
  Filled 2017-12-16: qty 100
  Filled 2017-12-16: qty 200
  Filled 2017-12-16 (×3): qty 100
  Filled 2017-12-16: qty 200
  Filled 2017-12-16 (×10): qty 100

## 2017-12-16 MED ORDER — MAGNESIUM HYDROXIDE 400 MG/5ML PO SUSP
15.0000 mL | Freq: Once | ORAL | Status: AC
  Administered 2017-12-16: 15 mL
  Filled 2017-12-16: qty 30

## 2017-12-16 MED ORDER — FENTANYL 2500MCG IN NS 250ML (10MCG/ML) PREMIX INFUSION
25.0000 ug/h | INTRAVENOUS | Status: DC
  Administered 2017-12-16: 50 ug/h via INTRAVENOUS
  Administered 2017-12-17: 250 ug/h via INTRAVENOUS
  Administered 2017-12-17: 150 ug/h via INTRAVENOUS
  Administered 2017-12-18: 250 ug/h via INTRAVENOUS
  Administered 2017-12-18: 200 ug/h via INTRAVENOUS
  Administered 2017-12-19: 400 ug/h via INTRAVENOUS
  Administered 2017-12-19: 275 ug/h via INTRAVENOUS
  Administered 2017-12-19: 300 ug/h via INTRAVENOUS
  Administered 2017-12-20: 400 ug/h via INTRAVENOUS
  Filled 2017-12-16 (×10): qty 250

## 2017-12-16 MED ORDER — MAGNESIUM HYDROXIDE NICU ORAL SYRINGE 400 MG/5 ML: 15.0000 mL | Freq: Every day | ORAL | Status: DC

## 2017-12-16 MED ORDER — DEXMEDETOMIDINE HCL IN NACL 200 MCG/50ML IV SOLN
0.0000 ug/kg/h | INTRAVENOUS | Status: DC
  Filled 2017-12-16: qty 50

## 2017-12-16 NOTE — Consult Note (Signed)
PULMONARY / CRITICAL CARE MEDICINE   Name: Chris [name]. MRN: [id number] DOB: [birth year]    ADMISSION DATE:  12/14/2017 CONSULTATION DATE: 5/7  REFERRING MD:  [email or web address]  CHIEF COMPLAINT:  Acute on chronic respiratory failure   HISTORY OF PRESENT ILLNESS:   This is a 52 year old male patient with a known history of gold 3 chronic obstructive pulmonary disease, tobacco abuse, chronic diastolic heart failure , And atrial fibrillation rate controlled and on apixaban.  He was recently discharged from the hospital back in March 2019 following a prolonged hospital course initially for influenza pneumonia resulting in ARDS, further complicated by atrial fib with RVR, acute kidney injury requiring dialysis, esophageal candidiasis, and ultimately tracheostomy placement to assist with liberation from ventilator.  He went to [facility] where he was eventually weaned from the vent, he may renal recovery, and was decannulated.  He was seen in our office for hospital follow-up on 5/1.  Office note reports he was advancing with physical therapy twice weekly, requiring PRN oxygen usually with exertion, and had been cutting down on cigarettes to 4 cigarettes a day.  He was noted to have thrush at that time and prescribed nystatin.  He presented to the emergency room on 5/6 with chief complaint of worsening shortness of breath.  This apparently started approximately 3 days prior to presentation, there was associated cough productive of white sputum, no chest pain, required more frequent use of rescue bronchodilators which initially gave him relief however over the days to follow did not.  He denies any recent sick contacts, fevers or chills.  He did have mild swelling in his legs but no significant edema.  He still actively smoking.  In the ED chest x-ray demonstrated some right greater than left interstitial prominence markings fairly similar to previous films.  BNP was negative, he had no significant  leukocytosis, arterial blood gas showed mild acute hypercarbic respiratory failure he was initially treated with noninvasive ventilation which he ultimately took off and did not place back on.  He was also placed on systemic steroids, scheduled bronchodilators, empiric doxycycline, supplemental oxygen, and was given 1 dose of furosemide.  Over the course of the evening he continued to have significant work of breathing.  Apparently was not agreeable to using BiPAP at one point, and slowly developed worsening increased work of breathing by the a.m. hours of 5/7.  Critical care was consulted the a.m. of 5/7 as patient developed rather acute respiratory distress.  This had slowly become more progressive.  Upon time of critical care arrival the patient was sitting up on the side of the bed.  Had marked accessory use, and pronounced upper airway wheezing.    SUBJECTIVE:  Opening eyes but not following commands on propofol, no events overnight  VITAL SIGNS: BP 125/80   Pulse 97   Temp 99.3 F (37.4 C) (Oral)   Resp (!) 30   Ht  (1.753 m)   Wt 233 lb 4 oz (105.8 kg)   SpO2 95%   BMI 34.44 kg/m   HEMODYNAMICS:    VENTILATOR SETTINGS: Vent Mode: PRVC FiO2 (%):  [40 %-100 %] 60 % Set Rate:  [10 bmp-30 bmp] 30 bmp Vt Set:  [570 mL] 570 mL PEEP:  [5 cmH20-10 cmH20] 10 cmH20 Plateau Pressure:  [25 cmH20-32 cmH20] 25 cmH20  INTAKE / OUTPUT: I/O last 3 completed shifts: In: 650.7 [I.V.:370.7; NG/GT:30; IV Piggyback:250] Out: 3635 [Urine:2935; Emesis/NG output:700]  PHYSICAL EXAMINATION: General: Chronically ill appearing male, sedate  and NAD Neuro: Sedate, unresponsive, withdraws to pain HEENT: [place]/AT, PERRL, EOM-I and MMM Cardiovascular: RRR, Nl S1/S2 and -M/R/G Lungs: Decreased BS diffusely Abdomen: Soft, NT, ND and +BS Musculoskeletal: Equal strength and bulk Skin: Warm and dry trace lower extremity edema  LABS:  BMET Recent Labs  Lab 12/15/17 1444 12/15/17 2159  12/16/17 0630  NA 140 139 139  K 6.3* 5.7* 5.5*  CL 101 103 102  CO2 33* 28 27  BUN 23* 34* 45*  CREATININE 1.36* 1.76* 2.18*  GLUCOSE 140* 181* 184*   Electrolytes Recent Labs  Lab 12/15/17 1444 12/15/17 2159 12/16/17 0630  CALCIUM 9.4 9.3 9.3   CBC Recent Labs  Lab 12/14/17 1526 12/14/17 1550 12/15/17 0602 12/16/17 0630  WBC 8.9  --  10.6* 14.7*  HGB 11.6* 12.6* 11.6* 10.2*  HCT 37.1* 37.0* 38.0* 33.8*  PLT 259  --  260 250   Coag's No results for input(s): APTT, INR in the last 168 hours.  Sepsis Markers Recent Labs  Lab 12/15/17 0953  PROCALCITON 0.10   ABG Recent Labs  Lab 12/14/17 2135 12/15/17 1504 12/15/17 1855  PHART 7.278* 7.117* 7.316*  PCO2ART 49.7* 105* 58.5*  PO2ART 72.3* 218* 71.0*   Liver Enzymes Recent Labs  Lab 12/14/17 1526  AST 21  ALT 20  ALKPHOS 46  BILITOT 0.3  ALBUMIN 3.4*   Cardiac Enzymes Recent Labs  Lab 12/15/17 0012 12/15/17 0602 12/15/17 0953  TROPONINI <0.03 <0.03 <0.03   Glucose Recent Labs  Lab 12/15/17 1157 12/15/17 1617 12/15/17 2010 12/15/17 2351 12/16/17 0335 12/16/17 0715  GLUCAP 191* 158* 184* 167* 197* 166*   Imaging Dg Chest Port 1 View  Result Date: 12/15/2017 CLINICAL DATA:  Endotracheal intubation EXAM: PORTABLE CHEST 1 VIEW COMPARISON:  Chest radiograph 12/15/2017 at 9:23 a.m. FINDINGS: Endotracheal tube tip is below the level of the clavicular heads approximately 3.2 cm above the carina. Orogastric tube courses below the diaphragm. Mediastinal silhouette and lungs are unchanged. Persistent bibasilar atelectasis. IMPRESSION: Endotracheal tube tip approximately 3.2 cm above the carina. Electronically Signed   By: Deatra Robinson M.D.   On: 12/15/2017 15:17   Dg Chest Port 1 View  Result Date: 12/15/2017 CLINICAL DATA:  Onset of shortness of breath and acute respiratory distress today. History of COPD, CHF, HIV, current smoker. EXAM: PORTABLE CHEST 1 VIEW COMPARISON:  Chest x-ray of Dec 14, 2017  FINDINGS: The lungs remain mildly hyperinflated. The interstitial markings are coarse especially at the bases but have improved slightly since yesterday's study. The cardiac silhouette is enlarged. The central pulmonary vascularity is prominent. There is no pleural effusion. IMPRESSION: Slight interval improvement in the appearance of bibasilar atelectasis or pneumonia. Mild cardiomegaly with central pulmonary vascular congestion. Electronically Signed   By: David  [place] M.D.   On: 12/15/2017 11:15   STUDIES:    CULTURES:   ANTIBIOTICS: Doxy 5/6>>> Fluconazole 5/7>>> Clinda 5/7>>> Anti-retrovirals 5/7>>>  SIGNIFICANT EVENTS:   LINES/TUBES:   DISCUSSION: 52 year old male patient with history of chronic respiratory failure in the setting of stage III chronic obstructive pulmonary disease and ongoing tobacco abuse.  Recently discharged after a prolonged hospitalization due to influenza pneumonia, resulting in ARDS, prolonged ventilator dependence, and ultimately tracheostomy as well as dialysis requirement.  Now readmitted with what appears to be acute exacerbation of chronic obstructive pulmonary disease further complicated by laryngopharyngeal reflux component as evidenced by loud upper airway wheeze.  We will place him on noninvasive ventilation, continue scheduled bronchodilators, continue systemic steroids, add  PPI, also change him to IV fluconazole.  His CD4 count was remarkably lower than times in the past however his viral load was still less than 20.  We will repeat this.  He is at very high risk for intubation.  If intubated he would benefit from bronchoscopy with bronchial alveolar lavage  ASSESSMENT / PLAN:  Acute on chronic hypoxic and hypercarbic respiratory failure in the setting of acute exacerbation of chronic obstructive pulmonary disease and what appears to be significant component of laryngal pharyngeal reflux disease as evidenced by marked upper airway wheezing.  -oral  candidiasis -Ongoing tobacco abuse -I think upper airway component is likely a large contributing factor to his acute exacerbation.  Doubt active smoking is helping.  Also given his recent tracheostomy I wonder about tracheal stenosis potentially contributing to his upper airway burden Plan ARDS protocol CXR in AM Advance ETT down 3 cm Fluconazole IV Doxy IV Continue scheduled bronchodilators Hold inhaled corticosteroids given active thrush infection Continue systemic steroids He needs to stop smoking  History of chronic atrial fibrillation and chronic diastolic heart failure. History of hypertension Is actually currently in sinus rhythm Plan Continue telemetry monitoring Continue rate control Continue apixaban  Acute on chronic renal failure with recent AKI dialysis -Renal function actually has been looking good.  Did notice a small bump following diuresis yesterday Plan Renal dose medications Hold further diuresis for now Kayexlate with colace and MOM BMET in AM Replace electrolytes as indicated  Mild hyperkalemia  Plan Kayexalate  Anemia of chronic disease without evidence of bleeding Plan Monitor  History of HIV.  Had been adherent with his medical regimen, however his last CD4 count was 40, but his viral load was less than 20. Plan Continue his HIV regimen per ID Check CD4 and HIV viral load  Diabetes, poorly controlled with hyperglycemia Plan Sliding scale insulin  History of anxiety and depression Plan Supportive care  FAMILY  - Updates: No family bedside to update  - Inter-disciplinary family meet or Palliative Care meeting due by: 5/17  The patient is critically ill with multiple organ systems failure and requires high complexity decision making for assessment and support, frequent evaluation and titration of therapies, application of advanced monitoring technologies and extensive interpretation of multiple databases.   Critical Care Time devoted to  patient care services described in this note is  32  Minutes. This time reflects time of care of this signee Dr Koren Bound. This critical care time does not reflect procedure time, or teaching time or supervisory time of PA/NP/Med student/Med Resident etc but could involve care discussion time.  Alyson Reedy, M.D. [facility] Pulmonary/Critical Care Medicine. Pager: [phone]. After hours pager: [phone].  12/16/2017, 8:46 AM

## 2017-12-16 NOTE — Progress Notes (Signed)
INFECTIOUS DISEASE PROGRESS NOTE  ID: Chris [name]. is a 52 y.o. male with  Principal Problem:   COPD exacerbation ([place]) Active Problems:   Obstructive sleep apnea syndrome   Essential hypertension, benign   Cigarette nicotine dependence without complication   GERD (gastroesophageal reflux disease)   Acute on chronic respiratory failure with hypoxia (HCC)   PAF (paroxysmal atrial fibrillation) (HCC)   Diabetes mellitus type 2 in obese (HCC)   HIV (human immunodeficiency virus infection) ([place])  Subjective: Worsening hypoxia o/n  Abtx:  Anti-infectives (From admission, onward)   Start     Dose/Rate Route Frequency Ordered Stop   12/16/17 1000  tenofovir (VIREAD) tablet 300 mg     300 mg Oral Daily 12/15/17 1613     12/16/17 1000  emtricitabine (EMTRIVA) capsule 200 mg     200 mg Oral Daily 12/15/17 1613     12/15/17 1800  clindamycin (CLEOCIN) IVPB 900 mg     900 mg 100 mL/hr over 30 Minutes Intravenous Every 8 hours 12/15/17 1624     12/15/17 1700  dapsone tablet 100 mg  Status:  Discontinued     100 mg Oral Daily 12/15/17 1613 12/15/17 1624   12/15/17 1700  dolutegravir (TIVICAY) tablet 50 mg     50 mg Oral Daily 12/15/17 1613     12/15/17 1630  primaquine tablet 30 mg     30 mg Per Tube Daily 12/15/17 1624     12/15/17 1100  fluconazole (DIFLUCAN) IVPB 400 mg     400 mg 100 mL/hr over 120 Minutes Intravenous Every 24 hours 12/15/17 0923     12/14/17 1900  doxycycline (VIBRA-TABS) tablet 100 mg     100 mg Oral Every 12 hours 12/14/17 1822     12/14/17 1830  bictegravir-emtricitabine-tenofovir AF (BIKTARVY) 50-200-25 MG per tablet 1 tablet  Status:  Discontinued     1 tablet Oral Daily 12/14/17 1819 12/15/17 1613      Medications:  Scheduled: . amitriptyline  100 mg Per Tube QHS  . apixaban  5 mg Per Tube BID  . chlorhexidine gluconate (MEDLINE KIT)  15 mL Mouth Rinse BID  . diltiazem  360 mg Oral Daily  . dolutegravir  50 mg Oral Daily  . doxycycline  100 mg  Oral Q12H  . tenofovir  300 mg Oral Daily   Or  . emtricitabine  200 mg Oral Daily  . insulin aspart  0-15 Units Subcutaneous Q4H  . insulin detemir  30 Units Subcutaneous QHS  . ipratropium  0.5 mg Nebulization Q6H  . levalbuterol  0.63 mg Nebulization Q6H  . mouth rinse  15 mL Mouth Rinse 10 times per day  . methylPREDNISolone (SOLU-MEDROL) injection  60 mg Intravenous Q8H  . pantoprazole sodium  40 mg Per Tube Q1200  . primaquine  30 mg Per Tube Daily  . sodium chloride flush  3 mL Intravenous Q12H    Objective: Vital signs in last 24 hours: Temp:  [96.7 F (35.9 C)-99.3 F (37.4 C)] 99.3 F (37.4 C) (05/08 0719) Pulse Rate:  [90-113] 97 (05/08 0759) Resp:  [15-30] 30 (05/08 0759) BP: (82-157)/(56-87) 125/80 (05/08 0759) SpO2:  [88 %-100 %] 95 % (05/08 0800) FiO2 (%):  [40 %-100 %] 60 % (05/08 0800) Weight:  [105.8 kg (233 lb 4 oz)] 105.8 kg (233 lb 4 oz) (05/08 0500)   General appearance: no distress Resp: wheezes anterior - bilateral Cardio: tachycardia GI: abnormal findings:  distended and hypoactive bowel  sounds Extremities: edema none in LE  Lab Results Recent Labs    12/15/17 0602  12/15/17 2159 12/16/17 0630  WBC 10.6*  --   --  14.7*  HGB 11.6*  --   --  10.2*  HCT 38.0*  --   --  33.8*  NA 138   < > 139 139  K 5.5*   < > 5.7* 5.5*  CL 101   < > 103 102  CO2 28   < > 28 27  BUN 20   < > 34* 45*  CREATININE 1.32*   < > 1.76* 2.18*   < > = values in this interval not displayed.   Liver Panel Recent Labs    12/14/17 1526  PROT 7.7  ALBUMIN 3.4*  AST 21  ALT 20  ALKPHOS 46  BILITOT 0.3   Sedimentation Rate No results for input(s): ESRSEDRATE in the last 72 hours. C-Reactive Protein No results for input(s): CRP in the last 72 hours.  Microbiology: Recent Results (from the past 240 hour(s))  MRSA PCR Screening     Status: None   Collection Time: 12/15/17  9:32 AM  Result Value Ref Range Status   MRSA by PCR NEGATIVE NEGATIVE Final     Comment:        The GeneXpert MRSA Assay (FDA approved for NASAL specimens only), is one component of a comprehensive MRSA colonization surveillance program. It is not intended to diagnose MRSA infection nor to guide or monitor treatment for MRSA infections. Performed at [facility], [place] [address] [place], [place] [address]   Culture, bal-quantitative     Status: None (Preliminary result)   Collection Time: 12/15/17  6:24 PM  Result Value Ref Range Status   Specimen Description BRONCHIAL ALVEOLAR LAVAGE  Final   Special Requests NONE  Final   Gram Stain   Final    ABUNDANT WBC PRESENT,BOTH PMN AND MONONUCLEAR FEW GRAM POSITIVE COCCI    Culture   Final    TOO YOUNG TO READ Performed at [facility], [place] [address], [place], [place] [address]    Report Status PENDING  Incomplete    Studies/Results: Dg Chest Port 1 View  Result Date: 12/15/2017 CLINICAL DATA:  Endotracheal intubation EXAM: PORTABLE CHEST 1 VIEW COMPARISON:  Chest radiograph 12/15/2017 at 9:23 a.m. FINDINGS: Endotracheal tube tip is below the level of the clavicular heads approximately 3.2 cm above the carina. Orogastric tube courses below the diaphragm. Mediastinal silhouette and lungs are unchanged. Persistent bibasilar atelectasis. IMPRESSION: Endotracheal tube tip approximately 3.2 cm above the carina. Electronically Signed   By: Ulyses Jarred M.D.   On: 12/15/2017 15:17   Dg Chest Port 1 View  Result Date: 12/15/2017 CLINICAL DATA:  Onset of shortness of breath and acute respiratory distress today. History of COPD, CHF, HIV, current smoker. EXAM: PORTABLE CHEST 1 VIEW COMPARISON:  Chest x-ray of Dec 14, 2017 FINDINGS: The lungs remain mildly hyperinflated. The interstitial markings are coarse especially at the bases but have improved slightly since yesterday's study. The cardiac silhouette is enlarged. The central pulmonary vascularity is prominent. There is no pleural effusion. IMPRESSION: Slight  interval improvement in the appearance of bibasilar atelectasis or pneumonia. Mild cardiomegaly with central pulmonary vascular congestion. Electronically Signed   By: David  Martinique M.D.   On: 12/15/2017 11:15   Dg Chest Port 1 View  Result Date: 12/14/2017 CLINICAL DATA:  SOB. Hx COPD with chronic home o2 use. Hx CHF, DM,  HTN. EXAM: PORTABLE CHEST 1 VIEW COMPARISON:  10/20/2017 FINDINGS: Cardiac silhouette is mildly enlarged.  No mediastinal or masses. There is prominence of the bronchovascular markings lung base interstitial thickening, similar to the prior exam. No convincing pneumonia or pulmonary edema. No pleural effusion or pneumothorax. A possible right internal jugular central venous line not well-defined on this image. The possible tip projects in the region of the upper superior vena cava. Skeletal structures are grossly intact. IMPRESSION: No acute cardiopulmonary disease. Electronically Signed   By: Lajean Manes M.D.   On: 12/14/2017 15:58     Assessment/Plan: AIDS ? PCP COPD gold 3 Candida- pulmonary CKD2  Total days of antibiotics: 1 clinda/primaquin, flucon. 2 doxy  Cr worsened, -2.9L BAL Cx pending. GPC on smear. His CXR is read at [facility]. Will hold anbx for now. He has been afebrile (could be steroids). WBC increasing (again could be steroids).  repeat CD4 and HIV RNA pending PCP stain pending Will stop doxy         Bobby Rumpf MD, FACP Infectious Diseases (pager) [phone] [email or web address] 12/16/2017, 8:40 AM  LOS: 2 days

## 2017-12-16 NOTE — Progress Notes (Signed)
eLink Physician-Brief Progress Note Patient Name: Chris [name]. DOB: [birth year] MRN: [id number]   Date of Service  12/16/2017  HPI/Events of Note  Pt became agitated and tried to self-extubate  eICU Interventions  Sedation increased, bilateral soft wrist restraints        Migdalia Dk 12/16/2017, 9:55 PM

## 2017-12-16 NOTE — Progress Notes (Signed)
Pt still w/ no BM today, OG suction has been off all shift, most recent K still elevated at 5.2. Spoke w/ Dr. Molli Knock, milk of mag and kayexalate ordered

## 2017-12-16 NOTE — Progress Notes (Signed)
Initial Nutrition Assessment  DOCUMENTATION CODES:   Obesity unspecified  INTERVENTION:    Vital High Protein at 45 ml/h (1080 ml per day)  Pro-stat 30 ml QID  Provides 1480 kcal, 155 gm protein, 903 ml free water daily  NUTRITION DIAGNOSIS:   Inadequate oral intake related to inability to eat as evidenced by NPO status.  GOAL:   Provide needs based on ASPEN/SCCM guidelines  MONITOR:   Vent status, TF tolerance, Skin, Labs, I & O's  REASON FOR ASSESSMENT:   Ventilator, Consult Enteral/tube feeding initiation and management  ASSESSMENT:   52 yo male with PMH of COPD (Gold 3), HIV, DM, depression, GERD, A fib, emphysema, HTN, and CHF who was admitted on 5/6 with acute on chronic respiratory failure. Required intubation 5/7.  Discussed patient with RN today. Received MD Consult for TF initiation and management. OGT in place. Patient is currently intubated on ventilator support MV: 16.9 L/min Temp (24hrs), Avg:98.9 F (37.2 C), Min:98.4 F (36.9 C), Max:99.8 F (37.7 C)  Propofol: none Labs reviewed. Potassium 5.5 (H) CBG's: [phone] Medications reviewed and include Colace, Fentanyl, Precedex, Diflucan, and Cleocin.    NUTRITION - FOCUSED PHYSICAL EXAM:    Most Recent Value  Orbital Region  No depletion  Upper Arm Region  No depletion  Thoracic and Lumbar Region  Unable to assess  Buccal Region  Unable to assess  Temple Region  Unable to assess  Clavicle Bone Region  No depletion  Clavicle and Acromion Bone Region  No depletion  Scapular Bone Region  Unable to assess  Dorsal Hand  Unable to assess  Patellar Region  No depletion  Anterior Thigh Region  No depletion  Posterior Calf Region  No depletion  Edema (RD Assessment)  None  Hair  Reviewed  Eyes  Unable to assess  Mouth  Unable to assess  Skin  Reviewed  Nails  Unable to assess       Diet Order:   Diet Order           Diet NPO time specified  Diet effective now           EDUCATION NEEDS:   No education needs have been identified at this time  Skin:  Skin Assessment: Skin Integrity Issues: Skin Integrity Issues:: Other (Comment) Other: anus medial warts  Last BM:  5/5  Height:   Ht Readings from Last 1 Encounters:  12/15/17  (1.753 m)    Weight:   Wt Readings from Last 1 Encounters:  12/16/17 233 lb 4 oz (105.8 kg)    Ideal Body Weight:  72.7 kg  BMI:  Body mass index is 34.44 kg/m.  Estimated Nutritional Needs:   Kcal:  1200-1480  Protein:  145 gm  Fluid:  2 L    Joaquin Courts, RD, LDN, CNSC Pager [phone] After Hours Pager [phone]

## 2017-12-17 ENCOUNTER — Inpatient Hospital Stay (HOSPITAL_COMMUNITY): Payer: Medicare HMO

## 2017-12-17 DIAGNOSIS — I480 Paroxysmal atrial fibrillation: Secondary | ICD-10-CM

## 2017-12-17 LAB — PNEUMOCYSTIS JIROVECI SMEAR BY DFA: PNEUMOCYSTIS JIROVECI AG: NEGATIVE

## 2017-12-17 LAB — GLUCOSE, CAPILLARY
GLUCOSE-CAPILLARY: 204 mg/dL — AB (ref 65–99)
GLUCOSE-CAPILLARY: 242 mg/dL — AB (ref 65–99)
Glucose-Capillary: 174 mg/dL — ABNORMAL HIGH (ref 65–99)
Glucose-Capillary: 211 mg/dL — ABNORMAL HIGH (ref 65–99)
Glucose-Capillary: 245 mg/dL — ABNORMAL HIGH (ref 65–99)
Glucose-Capillary: 281 mg/dL — ABNORMAL HIGH (ref 65–99)

## 2017-12-17 LAB — CBC
HCT: 35.3 % — ABNORMAL LOW (ref 39.0–52.0)
Hemoglobin: 10.6 g/dL — ABNORMAL LOW (ref 13.0–17.0)
MCH: 28.6 pg (ref 26.0–34.0)
MCHC: 30 g/dL (ref 30.0–36.0)
MCV: 95.1 fL (ref 78.0–100.0)
PLATELETS: 247 10*3/uL (ref 150–400)
RBC: 3.71 MIL/uL — AB (ref 4.22–5.81)
RDW: 14.4 % (ref 11.5–15.5)
WBC: 13 10*3/uL — AB (ref 4.0–10.5)

## 2017-12-17 LAB — PHOSPHORUS: Phosphorus: 4.5 mg/dL (ref 2.5–4.6)

## 2017-12-17 LAB — BASIC METABOLIC PANEL
Anion gap: 10 (ref 5–15)
BUN: 63 mg/dL — AB (ref 6–20)
CALCIUM: 9.3 mg/dL (ref 8.9–10.3)
CO2: 30 mmol/L (ref 22–32)
Chloride: 104 mmol/L (ref 101–111)
Creatinine, Ser: 1.89 mg/dL — ABNORMAL HIGH (ref 0.61–1.24)
GFR calc Af Amer: 46 mL/min — ABNORMAL LOW (ref >60)
GFR, EST NON AFRICAN AMERICAN: 40 mL/min — AB (ref >60)
GLUCOSE: 171 mg/dL — AB (ref 65–99)
Potassium: 4.7 mmol/L (ref 3.5–5.1)
SODIUM: 144 mmol/L (ref 135–145)

## 2017-12-17 LAB — POCT I-STAT 3, ART BLOOD GAS (G3+)
Acid-Base Excess: 4 mmol/L — ABNORMAL HIGH (ref 0.0–2.0)
BICARBONATE: 30.1 mmol/L — AB (ref 20.0–28.0)
O2 SAT: 90 %
PO2 ART: 64 mmHg — AB (ref 83.0–108.0)
TCO2: 32 mmol/L (ref 22–32)
pCO2 arterial: 54.4 mmHg — ABNORMAL HIGH (ref 32.0–48.0)
pH, Arterial: 7.35 (ref 7.350–7.450)

## 2017-12-17 LAB — GLUCOSE 6 PHOSPHATE DEHYDROGENASE
G6PDH: 5 U/g{Hb} (ref 4.6–13.5)
Hemoglobin: 10.6 g/dL — ABNORMAL LOW (ref 13.0–17.7)

## 2017-12-17 LAB — MAGNESIUM: MAGNESIUM: 2.6 mg/dL — AB (ref 1.7–2.4)

## 2017-12-17 MED ORDER — FLEET ENEMA 7-19 GM/118ML RE ENEM
1.0000 | ENEMA | Freq: Once | RECTAL | Status: AC
  Administered 2017-12-17: 1 via RECTAL
  Filled 2017-12-17: qty 1

## 2017-12-17 MED ORDER — LINEZOLID 600 MG/300ML IV SOLN
600.0000 mg | Freq: Two times a day (BID) | INTRAVENOUS | Status: DC
  Administered 2017-12-17 – 2017-12-18 (×3): 600 mg via INTRAVENOUS
  Filled 2017-12-17 (×4): qty 300

## 2017-12-17 MED ORDER — FUROSEMIDE 10 MG/ML IJ SOLN
40.0000 mg | Freq: Four times a day (QID) | INTRAMUSCULAR | Status: AC
  Administered 2017-12-17 (×3): 40 mg via INTRAVENOUS
  Filled 2017-12-17 (×3): qty 4

## 2017-12-17 MED ORDER — DILTIAZEM HCL 60 MG PO TABS
60.0000 mg | ORAL_TABLET | Freq: Four times a day (QID) | ORAL | Status: DC
  Administered 2017-12-18 – 2017-12-21 (×12): 60 mg via ORAL
  Filled 2017-12-17 (×14): qty 1

## 2017-12-17 NOTE — Progress Notes (Signed)
PULMONARY / CRITICAL CARE MEDICINE   Name: Chris [name]. MRN: [id number] DOB: 1966/07/17    ADMISSION DATE:  12/14/2017 CONSULTATION DATE: 5/7  REFERRING MD:  [email or web address]  CHIEF COMPLAINT:  Acute on chronic respiratory failure   HISTORY OF PRESENT ILLNESS:   This is a 52 year old male patient with a known history of gold 3 chronic obstructive pulmonary disease, tobacco abuse, chronic diastolic heart failure , And atrial fibrillation rate controlled and on apixaban.  He was recently discharged from the hospital back in March 2019 following a prolonged hospital course initially for influenza pneumonia resulting in ARDS, further complicated by atrial fib with RVR, acute kidney injury requiring dialysis, esophageal candidiasis, and ultimately tracheostomy placement to assist with liberation from ventilator.  He went to [facility] where he was eventually weaned from the vent, he may renal recovery, and was decannulated.  He was seen in our office for hospital follow-up on 5/1.  Office note reports he was advancing with physical therapy twice weekly, requiring PRN oxygen usually with exertion, and had been cutting down on cigarettes to 4 cigarettes a day.  He was noted to have thrush at that time and prescribed nystatin.  He presented to the emergency room on 5/6 with chief complaint of worsening shortness of breath.  This apparently started approximately 3 days prior to presentation, there was associated cough productive of white sputum, no chest pain, required more frequent use of rescue bronchodilators which initially gave him relief however over the days to follow did not.  He denies any recent sick contacts, fevers or chills.  He did have mild swelling in his legs but no significant edema.  He still actively smoking.  In the ED chest x-ray demonstrated some right greater than left interstitial prominence markings fairly similar to previous films.  BNP was negative, he had no significant  leukocytosis, arterial blood gas showed mild acute hypercarbic respiratory failure he was initially treated with noninvasive ventilation which he ultimately took off and did not place back on.  He was also placed on systemic steroids, scheduled bronchodilators, empiric doxycycline, supplemental oxygen, and was given 1 dose of furosemide.  Over the course of the evening he continued to have significant work of breathing.  Apparently was not agreeable to using BiPAP at one point, and slowly developed worsening increased work of breathing by the a.m. hours of 5/7.  Critical care was consulted the a.m. of 5/7 as patient developed rather acute respiratory distress.  This had slowly become more progressive.  Upon time of critical care arrival the patient was sitting up on the side of the bed.  Had marked accessory use, and pronounced upper airway wheezing.    SUBJECTIVE:  Agitated overnight and sedated, no event overnight  VITAL SIGNS: BP (!) 145/83   Pulse 73   Temp 99 F (37.2 C) (Oral)   Resp (!) 30   Ht  (1.753 m)   Wt 230 lb 9.6 oz (104.6 kg)   SpO2 93%   BMI 34.05 kg/m   HEMODYNAMICS:    VENTILATOR SETTINGS: Vent Mode: PRVC FiO2 (%):  [50 %-60 %] 50 % Set Rate:  [30 bmp] 30 bmp Vt Set:  [570 mL] 570 mL PEEP:  [10 cmH20] 10 cmH20 Plateau Pressure:  [26 cmH20-33 cmH20] 29 cmH20  INTAKE / OUTPUT: I/O last 3 completed shifts: In: 2590.3 [I.V.:1330; NG/GT:860.3; IV Piggyback:400] Out: 2330 [Urine:1630; Emesis/NG output:700]  PHYSICAL EXAMINATION: General: Chronically ill appearing male, NAD Neuro: Sedate but withdraws to  pain HEENT: [place]/AT, PERRL, EOM-I and MMM Cardiovascular: RRR, Nl S1/S2 and -M/R/G Lungs: Decreased BS diffusely Abdomen: Soft, NT, ND and +BS Musculoskeletal: Equal strength and bulk Skin: Intact  LABS:  BMET Recent Labs  Lab 12/16/17 0630 12/16/17 1600 12/17/17 0312  NA 139 141 144  K 5.5* 5.2* 4.7  CL 102 105 104  CO2 BUN 45* 55* 63*   CREATININE 2.18* 2.24* 1.89*  GLUCOSE 184* 190* 171*   Electrolytes Recent Labs  Lab 12/16/17 0630 12/16/17 1600 12/17/17 0312  CALCIUM 9.3 9.4 9.3  MG  --   --  2.6*  PHOS  --   --  4.5   CBC Recent Labs  Lab 12/15/17 0602 12/16/17 0630 12/17/17 0312  WBC 10.6* 14.7* 13.0*  HGB 11.6* 10.2* 10.6*  HCT 38.0* 33.8* 35.3*  PLT 260 250 247   Coag's No results for input(s): APTT, INR in the last 168 hours.  Sepsis Markers Recent Labs  Lab 12/15/17 0953  PROCALCITON 0.10   ABG Recent Labs  Lab 12/15/17 1504 12/15/17 1855 12/17/17 0353  PHART 7.117* 7.316* 7.350  PCO2ART 105* 58.5* 54.4*  PO2ART 218* 71.0* 64.0*   Liver Enzymes Recent Labs  Lab 12/14/17 1526  AST 21  ALT 20  ALKPHOS 46  BILITOT 0.3  ALBUMIN 3.4*   Cardiac Enzymes Recent Labs  Lab 12/15/17 0012 12/15/17 0602 12/15/17 0953  TROPONINI <0.03 <0.03 <0.03   Glucose Recent Labs  Lab 12/16/17 1209 12/16/17 1531 12/16/17 1925 12/16/17 2324 12/17/17 0316 12/17/17 0731  GLUCAP 203* 177* 223* 257* 174* 211*   Imaging Dg Abd Portable 1v  Result Date: 12/16/2017 CLINICAL DATA:  Orogastric placement. EXAM: PORTABLE ABDOMEN - 1 VIEW COMPARISON:  10/27/2017 FINDINGS: Orogastric tube enters the stomach in has its tip in the antral region. Abdominal gas pattern is unremarkable. IMPRESSION: Orogastric tube tip in the gastric antrum region. Electronically Signed   By: Paulina Fusi M.D.   On: 12/16/2017 10:28   STUDIES:    CULTURES:   ANTIBIOTICS: Doxy 5/6>>> Fluconazole 5/7>>> Clinda 5/7>>> Anti-retrovirals 5/7>>>  SIGNIFICANT EVENTS:   LINES/TUBES:   DISCUSSION: 52 year old male patient with history of chronic respiratory failure in the setting of stage III chronic obstructive pulmonary disease and ongoing tobacco abuse.  Recently discharged after a prolonged hospitalization due to influenza pneumonia, resulting in ARDS, prolonged ventilator dependence, and ultimately tracheostomy  as well as dialysis requirement.  Now readmitted with what appears to be acute exacerbation of chronic obstructive pulmonary disease further complicated by laryngopharyngeal reflux component as evidenced by loud upper airway wheeze.  We will place him on noninvasive ventilation, continue scheduled bronchodilators, continue systemic steroids, add PPI, also change him to IV fluconazole.  His CD4 count was remarkably lower than times in the past however his viral load was still less than 20.  We will repeat this.  He is at very high risk for intubation.  If intubated he would benefit from bronchoscopy with bronchial alveolar lavage  ASSESSMENT / PLAN:  Acute on chronic hypoxic and hypercarbic respiratory failure in the setting of acute exacerbation of chronic obstructive pulmonary disease and what appears to be significant component of laryngal pharyngeal reflux disease as evidenced by marked upper airway wheezing.  -oral candidiasis -Ongoing tobacco abuse -I think upper airway component is likely a large contributing factor to his acute exacerbation.  Doubt active smoking is helping.  Also given his recent tracheostomy I wonder about tracheal stenosis potentially contributing to his upper  airway burden Plan ARDS protocol CXR in AM Fluconazole IV Doxy IV Clinda IV Continue scheduled bronchodilators Hold inhaled corticosteroids given active thrush infection Continue systemic steroids Smoking cessation when more awake  History of chronic atrial fibrillation and chronic diastolic heart failure. History of hypertension Is actually currently in sinus rhythm Plan Continue telemetry monitoring Rate control as ordered Continue eliquis  Acute on chronic renal failure with recent AKI dialysis -Renal function actually has been looking good.  Did notice a small bump following diuresis yesterday Plan Renal dose medications Lasix 40 mg IV q6 x3 doses BMET in AM Replace electrolytes as  indicated  Mild hyperkalemia  Plan Kayexalate Lasix  Anemia of chronic disease without evidence of bleeding Plan Monitor  History of HIV.  Had been adherent with his medical regimen, however his last CD4 count was 40, but his viral load was less than 20. Plan Continue his HIV regimen per ID Check CD4 and HIV viral load ID consult appreciated  Diabetes, poorly controlled with hyperglycemia Plan Sliding scale insulin  History of anxiety and depression Plan Supportive care  FAMILY  - Updates: No family bedside to update  - Inter-disciplinary family meet or Palliative Care meeting due by: 5/17  The patient is critically ill with multiple organ systems failure and requires high complexity decision making for assessment and support, frequent evaluation and titration of therapies, application of advanced monitoring technologies and extensive interpretation of multiple databases.   Critical Care Time devoted to patient care services described in this note is  36  Minutes. This time reflects time of care of this signee Dr Koren Bound. This critical care time does not reflect procedure time, or teaching time or supervisory time of PA/NP/Med student/Med Resident etc but could involve care discussion time.  Alyson Reedy, M.D. [facility] Pulmonary/Critical Care Medicine. Pager: [phone]. After hours pager: [phone].  12/17/2017, 9:56 AM

## 2017-12-17 NOTE — Progress Notes (Signed)
INFECTIOUS DISEASE PROGRESS NOTE  ID: Chris [name]. is a 52 y.o. male with  Principal Problem:   COPD exacerbation ([place]) Active Problems:   Obstructive sleep apnea syndrome   Essential hypertension, benign   Cigarette nicotine dependence without complication   GERD (gastroesophageal reflux disease)   Acute on chronic respiratory failure with hypoxia (H[facility])   PAF (paroxysmal atrial fibrillation) (HCC)   Diabetes mellitus type 2 in obese ([place])   HIV (human immunodeficiency virus infection) ([place])  Subjective: On vent  Abtx:  Anti-infectives (From admission, onward)   Start     Dose/Rate Route Frequency Ordered Stop   12/16/17 1000  tenofovir (VIREAD) tablet 300 mg     300 mg Oral Daily 12/15/17 1613     12/16/17 1000  emtricitabine (EMTRIVA) capsule 200 mg     200 mg Oral Daily 12/15/17 1613     12/15/17 1800  clindamycin (CLEOCIN) IVPB 900 mg     900 mg 100 mL/hr over 30 Minutes Intravenous Every 8 hours 12/15/17 1624     12/15/17 1700  dapsone tablet 100 mg  Status:  Discontinued     100 mg Oral Daily 12/15/17 1613 12/15/17 1624   12/15/17 1700  dolutegravir (TIVICAY) tablet 50 mg     50 mg Oral Daily 12/15/17 1613     12/15/17 1630  primaquine tablet 30 mg     30 mg Per Tube Daily 12/15/17 1624     12/15/17 1100  fluconazole (DIFLUCAN) IVPB 400 mg     400 mg 100 mL/hr over 120 Minutes Intravenous Every 24 hours 12/15/17 0923     12/14/17 1900  doxycycline (VIBRA-TABS) tablet 100 mg  Status:  Discontinued     100 mg Oral Every 12 hours 12/14/17 1822 12/16/17 0851   12/14/17 1830  bictegravir-emtricitabine-tenofovir AF (BIKTARVY) 50-200-25 MG per tablet 1 tablet  Status:  Discontinued     1 tablet Oral Daily 12/14/17 1819 12/15/17 1613      Medications:  Scheduled: . [START ON 12/18/2017] amitriptyline  25 mg Oral QHS  . amitriptyline  50 mg Per Tube QHS  . apixaban  5 mg Per Tube BID  . chlorhexidine gluconate (MEDLINE KIT)  15 mL Mouth Rinse BID  . diltiazem   360 mg Oral Daily  . docusate  100 mg Per Tube BID  . dolutegravir  50 mg Oral Daily  . tenofovir  300 mg Oral Daily   Or  . emtricitabine  200 mg Oral Daily  . feeding supplement (PRO-STAT SUGAR FREE 64)  30 mL Per Tube QID  . feeding supplement (VITAL HIGH PROTEIN)  1,000 mL Per Tube Q24H  . furosemide  40 mg Intravenous Q6H  . insulin aspart  0-15 Units Subcutaneous Q4H  . insulin detemir  30 Units Subcutaneous QHS  . ipratropium  0.5 mg Nebulization Q6H  . levalbuterol  0.63 mg Nebulization Q6H  . mouth rinse  15 mL Mouth Rinse 10 times per day  . methylPREDNISolone (SOLU-MEDROL) injection  60 mg Intravenous Q8H  . pantoprazole sodium  40 mg Per Tube Q1200  . primaquine  30 mg Per Tube Daily  . sodium chloride flush  3 mL Intravenous Q12H  . sodium phosphate  1 enema Rectal Once    Objective: Vital signs in last 24 hours: Temp:  [99 F (37.2 C)-99.8 F (37.7 C)] 99 F (37.2 C) (05/09 0733) Pulse Rate:  [68-93] 73 (05/09 0900) Resp:  [17-30] 30 (05/09 0900) BP: (104-152)/(65-92)  145/83 (05/09 0900) SpO2:  [92 %-97 %] 93 % (05/09 0900) FiO2 (%):  [50 %-60 %] 50 % (05/09 0725) Weight:  [104.6 kg (230 lb 9.6 oz)] 104.6 kg (230 lb 9.6 oz) (05/09 0300)   General appearance: no distress Resp: rhonchi anterior - bilateral Cardio: regular rate and rhythm GI: abnormal findings:  distended and hypoactive bowel sounds Extremities: edema none  Lab Results Recent Labs    12/16/17 0630 12/16/17 1600 12/17/17 0312  WBC 14.7*  --  13.0*  HGB 10.2*  --  10.6*  HCT 33.8*  --  35.3*  NA 139 141 144  K 5.5* 5.2* 4.7  CL 102 105 104  CO2 _0 BUN 45* 55* 63*  CREATININE 2.18* 2.24* 1.89*   Liver Panel Recent Labs    12/14/17 1526  PROT 7.7  ALBUMIN 3.4*  AST 21  ALT 20  ALKPHOS 46  BILITOT 0.3   Sedimentation Rate No results for input(s): ESRSEDRATE in the last 72 hours. C-Reactive Protein No results for input(s): CRP in the last 72  hours.  Microbiology: Recent Results (from the past 240 hour(s))  MRSA PCR Screening     Status: None   Collection Time: 12/15/17  9:32 AM  Result Value Ref Range Status   MRSA by PCR NEGATIVE NEGATIVE Final    Comment:        The GeneXpert MRSA Assay (FDA approved for NASAL specimens only), is one component of a comprehensive MRSA colonization surveillance program. It is not intended to diagnose MRSA infection nor to guide or monitor treatment for MRSA infections. Performed at [facility], [place] [address], [place], [place] [address]   Culture, bal-quantitative     Status: None (Preliminary result)   Collection Time: 12/15/17  6:24 PM  Result Value Ref Range Status   Specimen Description BRONCHIAL ALVEOLAR LAVAGE  Final   Special Requests NONE  Final   Gram Stain   Final    ABUNDANT WBC PRESENT,BOTH PMN AND MONONUCLEAR FEW GRAM POSITIVE COCCI    Culture   Final    CULTURE REINCUBATED FOR BETTER GROWTH Performed at [facility], [place] [address], [place], [place] [address]    Report Status PENDING  Incomplete    Studies/Results: Dg Chest Port 1 View  Result Date: 12/17/2017 CLINICAL DATA:  Check endotracheal tube placement EXAM: PORTABLE CHEST 1 VIEW COMPARISON:  12/16/2017 FINDINGS: Endotracheal tube and nasogastric catheter are again noted and stable. The lungs are well aerated bilaterally. The previously seen basilar changes have improved in the interval. Cardiac shadow is stable. No bony abnormality is noted. IMPRESSION: Improving aeration in the bases bilaterally. Electronically Signed   By: Inez Catalina M.D.   On: 12/17/2017 10:14   Dg Chest Port 1 View  Result Date: 12/16/2017 CLINICAL DATA:  Endotracheal placement. EXAM: PORTABLE CHEST 1 VIEW COMPARISON:  12/15/2017 FINDINGS: Endotracheal tube tip is 5 cm above the carina. There is infiltrate in both lower lobes, right more than left, consistent with basilar pneumonia. Upper lungs are clear. No evidence of  heart failure or effusion. IMPRESSION: Endotracheal tube tip 5 cm above the carina. Bilateral lower lobe infiltrate right more than left. Electronically Signed   By: Nelson Chimes M.D.   On: 12/16/2017 10:28   Dg Chest Port 1 View  Result Date: 12/15/2017 CLINICAL DATA:  Endotracheal intubation EXAM: PORTABLE CHEST 1 VIEW COMPARISON:  Chest radiograph 12/15/2017 at 9:23 a.m. FINDINGS: Endotracheal tube tip is below the  level of the clavicular heads approximately 3.2 cm above the carina. Orogastric tube courses below the diaphragm. Mediastinal silhouette and lungs are unchanged. Persistent bibasilar atelectasis. IMPRESSION: Endotracheal tube tip approximately 3.2 cm above the carina. Electronically Signed   By: Ulyses Jarred M.D.   On: 12/15/2017 15:17   Dg Abd Portable 1v  Result Date: 12/16/2017 CLINICAL DATA:  Orogastric placement. EXAM: PORTABLE ABDOMEN - 1 VIEW COMPARISON:  10/27/2017 FINDINGS: Orogastric tube enters the stomach in has its tip in the antral region. Abdominal gas pattern is unremarkable. IMPRESSION: Orogastric tube tip in the gastric antrum region. Electronically Signed   By: Nelson Chimes M.D.   On: 12/16/2017 10:28     Assessment/Plan: AIDS (CD4 60) ? PCP COPD gold 3 Candida- pulmonary CKD2  Total days of antibiotics: 2 clinda/primaquin, flucon.   Await HIV RNA Will add zyvox while awaiting his BAL Cx Await PCP stain CXR better Cr better.  FiO2 unchanged         Bobby Rumpf MD, FACP Infectious Diseases (pager) [phone] [email or web address] 12/17/2017, 10:21 AM  LOS: 3 days

## 2017-12-17 NOTE — Progress Notes (Signed)
Inpatient Diabetes Program Recommendations  AACE/[name]: New Consensus Statement on Inpatient Glycemic Control (2015)  Target Ranges:  Prepandial:   less than 140 mg/dL      Peak postprandial:   less than 180 mg/dL (1-2 hours)      Critically ill patients:  140 - 180 mg/dL   Lab Results  Component Value Date   GLUCAP 211 (H) 12/17/2017   HGBA1C 5.6 12/02/2017    Review of Glycemic ControlResults for BRYAM, TABORDA (MRN [id number]) as of 12/17/2017 09:25  Ref. Range 12/16/2017 15:31 12/16/2017 19:25 12/16/2017 23:24 12/17/2017 03:16 12/17/2017 07:31  Glucose-Capillary Latest Ref Range: 65 - 99 mg/dL 409 (H) 811 (H) 914 (H) 174 (H) 211 (H)    Diabetes history: Type 2 DM  Outpatient Diabetes medications:  Levemir 30 units q HS, Metformin 1000 mg daily Current orders for Inpatient glycemic control:  Novolog moderate q 4 hours, Levemir 30 units q HS Solumedrol 60 mg IV q 8 hours, Vital at 45 ml /hr Inpatient Diabetes Program Recommendations:   Please consider adding Novolog tube feed coverage 3 units q 4 hours.   Thanks,  Beryl Meager, RN, BC-ADM Inpatient Diabetes Coordinator Pager [phone] (8a-5p)

## 2017-12-18 ENCOUNTER — Inpatient Hospital Stay (HOSPITAL_COMMUNITY): Payer: Medicare HMO

## 2017-12-18 DIAGNOSIS — B379 Candidiasis, unspecified: Secondary | ICD-10-CM

## 2017-12-18 DIAGNOSIS — J9620 Acute and chronic respiratory failure, unspecified whether with hypoxia or hypercapnia: Secondary | ICD-10-CM

## 2017-12-18 LAB — CBC
HCT: 36.9 % — ABNORMAL LOW (ref 39.0–52.0)
Hemoglobin: 11.1 g/dL — ABNORMAL LOW (ref 13.0–17.0)
MCH: 28.9 pg (ref 26.0–34.0)
MCHC: 30.1 g/dL (ref 30.0–36.0)
MCV: 96.1 fL (ref 78.0–100.0)
PLATELETS: 237 10*3/uL (ref 150–400)
RBC: 3.84 MIL/uL — ABNORMAL LOW (ref 4.22–5.81)
RDW: 14.4 % (ref 11.5–15.5)
WBC: 9.6 10*3/uL (ref 4.0–10.5)

## 2017-12-18 LAB — CULTURE, BAL-QUANTITATIVE: CULTURE: NORMAL — AB

## 2017-12-18 LAB — BASIC METABOLIC PANEL
ANION GAP: 12 (ref 5–15)
BUN: 85 mg/dL — AB (ref 6–20)
CO2: 27 mmol/L (ref 22–32)
Calcium: 9.1 mg/dL (ref 8.9–10.3)
Chloride: 106 mmol/L (ref 101–111)
Creatinine, Ser: 1.95 mg/dL — ABNORMAL HIGH (ref 0.61–1.24)
GFR, EST AFRICAN AMERICAN: 44 mL/min — AB (ref >60)
GFR, EST NON AFRICAN AMERICAN: 38 mL/min — AB (ref >60)
GLUCOSE: 344 mg/dL — AB (ref 65–99)
Potassium: 4.8 mmol/L (ref 3.5–5.1)
Sodium: 145 mmol/L (ref 135–145)

## 2017-12-18 LAB — POCT I-STAT 3, ART BLOOD GAS (G3+)
ACID-BASE EXCESS: 5 mmol/L — AB (ref 0.0–2.0)
Acid-Base Excess: 4 mmol/L — ABNORMAL HIGH (ref 0.0–2.0)
BICARBONATE: 31.3 mmol/L — AB (ref 20.0–28.0)
Bicarbonate: 30.8 mmol/L — ABNORMAL HIGH (ref 20.0–28.0)
O2 Saturation: 75 %
O2 Saturation: 97 %
PCO2 ART: 56.8 mmHg — AB (ref 32.0–48.0)
PH ART: 7.345 — AB (ref 7.350–7.450)
PH ART: 7.38 (ref 7.350–7.450)
PO2 ART: 45 mmHg — AB (ref 83.0–108.0)
Patient temperature: 99.8
Patient temperature: 98.6
TCO2: 32 mmol/L (ref 22–32)
TCO2: 33 mmol/L — ABNORMAL HIGH (ref 22–32)
pCO2 arterial: 52.9 mmHg — ABNORMAL HIGH (ref 32.0–48.0)
pO2, Arterial: 94 mmHg (ref 83.0–108.0)

## 2017-12-18 LAB — MAGNESIUM: Magnesium: 2.6 mg/dL — ABNORMAL HIGH (ref 1.7–2.4)

## 2017-12-18 LAB — GLUCOSE, CAPILLARY
GLUCOSE-CAPILLARY: 308 mg/dL — AB (ref 65–99)
GLUCOSE-CAPILLARY: 258 mg/dL — AB (ref 65–99)
Glucose-Capillary: 348 mg/dL — ABNORMAL HIGH (ref 65–99)
Glucose-Capillary: 310 mg/dL — ABNORMAL HIGH (ref 65–99)
Glucose-Capillary: 284 mg/dL — ABNORMAL HIGH (ref 65–99)
Glucose-Capillary: 302 mg/dL — ABNORMAL HIGH (ref 65–99)

## 2017-12-18 LAB — PHOSPHORUS: Phosphorus: 3.8 mg/dL (ref 2.5–4.6)

## 2017-12-18 LAB — CULTURE, BAL-QUANTITATIVE W GRAM STAIN

## 2017-12-18 LAB — TRIGLYCERIDES: Triglycerides: 196 mg/dL — ABNORMAL HIGH (ref <150)

## 2017-12-18 MED ORDER — INSULIN ASPART 100 UNIT/ML ~~LOC~~ SOLN: 1.0000 [IU] | SUBCUTANEOUS | Status: DC

## 2017-12-18 NOTE — Progress Notes (Signed)
PULMONARY / CRITICAL CARE MEDICINE   Name: Chris [name]. MRN: [id number] DOB: 25-Dec-1965    ADMISSION DATE:  12/14/2017 CONSULTATION DATE: 5/7 LOS 4 days  REFERRING MD:  [email or web address]  CHIEF COMPLAINT:  Acute on chronic respiratory failure   BRIEF   This is a 52 year old male patient with a known history of gold 3 chronic obstructive pulmonary disease, tobacco abuse, chronic diastolic heart failure , And atrial fibrillation rate controlled and on apixaban.  He was recently discharged from the hospital back in March 2019 following a prolonged hospital course initially for influenza pneumonia resulting in ARDS, further complicated by atrial fib with RVR, acute kidney injury requiring dialysis, esophageal candidiasis, and ultimately tracheostomy placement to assist with liberation from ventilator.  He went to [facility] where he was eventually weaned from the vent, he may renal recovery, and was decannulated.  He was seen in our office for hospital follow-up on 5/1.  Office note reports he was advancing with physical therapy twice weekly, requiring PRN oxygen usually with exertion, and had been cutting down on cigarettes to 4 cigarettes a day.  He was noted to have thrush at that time and prescribed nystatin.  He presented to the emergency room on 5/6 with chief complaint of worsening shortness of breath.  This apparently started approximately 3 days prior to presentation, there was associated cough productive of white sputum, no chest pain, required more frequent use of rescue bronchodilators which initially gave him relief however over the days to follow did not.  He denies any recent sick contacts, fevers or chills.  He did have mild swelling in his legs but no significant edema.  He still actively smoking.  In the ED chest x-ray demonstrated some right greater than left interstitial prominence markings fairly similar to previous films.  BNP was negative, he had no significant leukocytosis, arterial  blood gas showed mild acute hypercarbic respiratory failure he was initially treated with noninvasive ventilation which he ultimately took off and did not place back on.  He was also placed on systemic steroids, scheduled bronchodilators, empiric doxycycline, supplemental oxygen, and was given 1 dose of furosemide.  Over the course of the evening he continued to have significant work of breathing.  Apparently was not agreeable to using BiPAP at one point, and slowly developed worsening increased work of breathing by the a.m. hours of 5/7.  Critical care was consulted the a.m. of 5/7 as patient developed rather acute respiratory distress.  This had slowly become more progressive.  Upon time of critical care arrival the patient was sitting up on the side of the bed.  Had marked accessory use, and pronounced upper airway wheezing.     CULTURES:   ANTIBIOTICS: Doxy 5/6>>> Fluconazole 5/7>>> Clinda 5/7>>> Anti-retrovirals 5/7>>>  SIGNIFICANT EVENTS: 5/9 - Agitated overnight and sedated, no event overnight   SUBJECTIVE/OVERNIGHT/INTERVAL HX 5/10 - On precedex gtt and fent gtt . -> on WUA and can follow commands on sedation. Gets agitated when sedation weaned. Not done SBT because PEEP 10. Not on pressors. Temp 100.67F - diaphoretic. Not on HD/CRRT. MAking urine.   VITAL SIGNS: BP 137/83   Pulse 86   Temp (!) 100.8 F (38.2 C) (Oral) Comment: RN MaryBeth aware   Resp (!) 32   Ht  (1.753 m)   Wt 102 kg (224 lb 13.9 oz)   SpO2 100%   BMI 33.21 kg/m   HEMODYNAMICS:    VENTILATOR SETTINGS: Vent Mode: PRVC FiO2 (%):  [50 %-  70 %] 50 % Set Rate:  [30 bmp] 30 bmp Vt Set:  [570 mL] 570 mL PEEP:  [10 cmH20] 10 cmH20 Plateau Pressure:  [24 cmH20-28 cmH20] 27 cmH20  INTAKE / OUTPUT: I/O last 3 completed shifts: In: 4703.2 [I.V.:1943.2; NG/GT:1760; IV Piggyback:1000] Out: 4590 [Urine:4590]  PHYSICAL EXAMINATION:  General Appearance:    Looks criticall ill OBESE - +  Head:     Normocephalic, without obvious abnormality, atraumatic  Eyes:    PERRL - yes, conjunctiva/corneas - clear      Ears:    Normal external ear canals, both ears  Nose:   NG tube - no  Throat:  ETT TUBE - yes , OG tube - yes  Neck:   Supple,  No enlargement/tenderness/nodules     Lungs:     Clear to auscultation bilaterally, Ventilator   Synchrony - yes on 50% fio2 and peep 10  Chest wall:    No deformity  Heart:    S1 and S2 normal, no murmur, CVP - no.  Pressors - no  Abdomen:     Soft, no masses, no organomegaly  Genitalia:    Not done  Rectal:   not done  Extremities:   Extremities- intact     Skin:   Intact in exposed areas . Sacral area - no sacral decub reported     Neurologic:   Sedation - none -> RASS - -3 on fent and precredex gtt. Moves all 4s - . CAM-ICU - na . Orientation - na       PULMONARY Recent Labs  Lab 12/14/17 1550  12/15/17 1504 12/15/17 1855 12/17/17 0353 12/18/17 0415 12/18/17 0517  PHART  --    < > 7.117* 7.316* 7.350 7.345* 7.380  PCO2ART  --    < > 105* 58.5* 54.4* 56.8* 52.9*  PO2ART  --    < > 218* 71.0* 64.0* 45.0* 94.0  HCO3  --    < > 32.5* 29.9* 30.1* 30.8* 31.3*  TCO2 27  --   --  32 32 32 33*  O2SAT  --    < > 98.9 92.0 90.0 75.0 97.0   < > = values in this interval not displayed.    CBC Recent Labs  Lab 12/16/17 0630 12/17/17 0312 12/18/17 0255  HGB 10.2* 10.6* 11.1*  HCT 33.8* 35.3* 36.9*  WBC 14.7* 13.0* 9.6  PLT 250 247 237    COAGULATION No results for input(s): INR in the last 168 hours.  CARDIAC   Recent Labs  Lab 12/15/17 0012 12/15/17 0602 12/15/17 0953  TROPONINI <0.03 <0.03 <0.03   No results for input(s): PROBNP in the last 168 hours.   CHEMISTRY Recent Labs  Lab 12/15/17 2159 12/16/17 0630 12/16/17 1600 12/17/17 0312 12/18/17 0255  NA 139 139 141 144 145  K 5.7* 5.5* 5.2* 4.7 4.8  CL 103 102 105 104 106  CO2 GLUCOSE 181* 184* 190* 171* 344*  BUN 34* 45* 55* 63* 85*  CREATININE  1.76* 2.18* 2.24* 1.89* 1.95*  CALCIUM 9.3 9.3 9.4 9.3 9.1  MG  --   --   --  2.6* 2.6*  PHOS  --   --   --  4.5 3.8   Estimated Creatinine Clearance: 52.7 mL/min (A) (by C-G formula based on SCr of 1.95 mg/dL (H)).   LIVER Recent Labs  Lab 12/14/17 1526  AST 21  ALT 20  ALKPHOS 46  BILITOT 0.3  PROT 7.7  ALBUMIN 3.4*     INFECTIOUS Recent Labs  Lab 12/15/17 0953  PROCALCITON 0.10     ENDOCRINE CBG (last 3)  Recent Labs    12/17/17 2353 12/18/17 0419 12/18/17 0723  GLUCAP 245* 308* 258*         IMAGING x48h  - image(s) personally visualized  -   highlighted in bold Dg Chest Port 1 View  Result Date: 12/17/2017 CLINICAL DATA:  Check endotracheal tube placement EXAM: PORTABLE CHEST 1 VIEW COMPARISON:  12/16/2017 FINDINGS: Endotracheal tube and nasogastric catheter are again noted and stable. The lungs are well aerated bilaterally. The previously seen basilar changes have improved in the interval. Cardiac shadow is stable. No bony abnormality is noted. IMPRESSION: Improving aeration in the bases bilaterally. Electronically Signed   By: Alcide Clever M.D.   On: 12/17/2017 10:14   Dg Chest Port 1 View  Result Date: 12/16/2017 CLINICAL DATA:  Endotracheal placement. EXAM: PORTABLE CHEST 1 VIEW COMPARISON:  12/15/2017 FINDINGS: Endotracheal tube tip is 5 cm above the carina. There is infiltrate in both lower lobes, right more than left, consistent with basilar pneumonia. Upper lungs are clear. No evidence of heart failure or effusion. IMPRESSION: Endotracheal tube tip 5 cm above the carina. Bilateral lower lobe infiltrate right more than left. Electronically Signed   By: Paulina Fusi M.D.   On: 12/16/2017 10:28   Dg Abd Portable 1v  Result Date: 12/16/2017 CLINICAL DATA:  Orogastric placement. EXAM: PORTABLE ABDOMEN - 1 VIEW COMPARISON:  10/27/2017 FINDINGS: Orogastric tube enters the stomach in has its tip in the antral region. Abdominal gas pattern is unremarkable.  IMPRESSION: Orogastric tube tip in the gastric antrum region. Electronically Signed   By: Paulina Fusi M.D.   On: 12/16/2017 10:28        DISCUSSION: 52 year old male patient with history of chronic respiratory failure in the setting of stage III chronic obstructive pulmonary disease and ongoing tobacco abuse.  Recently discharged after a prolonged hospitalization due to influenza pneumonia, resulting in ARDS, prolonged ventilator dependence, and ultimately tracheostomy as well as dialysis requirement.  Now readmitted with what appears to be acute exacerbation of chronic obstructive pulmonary disease further complicated by laryngopharyngeal reflux component as evidenced by loud upper airway wheeze.  We will place him on noninvasive ventilation, continue scheduled bronchodilators, continue systemic steroids, add PPI, also change him to IV fluconazole.  His CD4 count was remarkably lower than times in the past however his viral load was still less than 20.  We will repeat this.  He is at very high risk for intubation.  If intubated he would benefit from bronchoscopy with bronchial alveolar lavage  ASSESSMENT / PLAN:  Acute on chronic hypoxic and hypercarbic respiratory failure in the setting of acute exacerbation of chronic obstructive pulmonary disease and what appears to be significant component of laryngal pharyngeal reflux disease as evidenced by marked upper airway wheezing.  -oral candidiasis -Ongoing tobacco abuse -I think upper airway component is likely a large contributing factor to his acute exacerbation.  Doubt active smoking is helping.  Also given his recent tracheostomy I wonder about tracheal stenosis potentially contributing to his upper airway burden  12/18/2017 - 12/18/2017 - > does not meet criteria for SBT/Extubation in setting of Acute Respiratory Failure due to high peep setting and wake up in progeress   Plan Full vent support Wean peep and then try for sBT   History of  chronic atrial fibrillation and chronic diastolic heart failure. History of hypertension  Is actually currently in sinus rhythm  12/18/2017 - maintains BP/HR  Plan Continue telemetry monitoring Rate control as ordered Continue eliquis  Acute on chronic renal failure with recent AKI dialysis  12/18/2017 - mild AKI improving  Plan Renal dose medications BMET in AM Replace electrolytes as indicated    Anemia of chronic disease without evidence of bleeding Plan - PRBC for hgb </= 6.9gm%    - exceptions are   -  if ACS susepcted/confirmed then transfuse for hgb </= 8.0gm%,  or    -  active bleeding with hemodynamic instability, then transfuse regardless of hemoglobin value   At at all times try to transfuse 1 unit prbc as possible with exception of active hemorrhage    History of HIV.  Had been adherent with his medical regimen, however his last CD4 count was 40, but his viral load was less than 20. Plan Continue his HIV regimen per ID Check CD4 and HIV viral load ID consult appreciated  Diabetes, poorly controlled with hyperglycemia Plan Sliding scale insulin  History of anxiety and depression Plan Supportive care  FAMILY  - Updates: No family at bedside 12/18/2017   - Inter-disciplinary family meet or Palliative Care meeting due by: 5/17      The patient is critically ill with multiple organ systems failure and requires high complexity decision making for assessment and support, frequent evaluation and titration of therapies, application of advanced monitoring technologies and extensive interpretation of multiple databases.   Critical Care Time devoted to patient care services described in this note is  30  Minutes. This time reflects time of care of this signee Dr Kalman Shan. This critical care time does not reflect procedure time, or teaching time or supervisory time of PA/NP/Med student/Med Resident etc but could involve care discussion time    Dr.  Kalman Shan, M.D., [facility].C.P Pulmonary and Critical Care Medicine Staff Physician [facility] Pulmonary and Critical Care Pager: [phone], If no answer or between  15:00h - 7:00h: call [phone]  12/18/2017 9:03 AM

## 2017-12-18 NOTE — Plan of Care (Signed)
  Problem: Education: Goal: Knowledge of General Education information will improve Outcome: Progressing   Problem: Pain Managment: Goal: General experience of comfort will improve Outcome: Progressing   Problem: Safety: Goal: Ability to remain free from injury will improve Outcome: Progressing   Problem: Skin Integrity: Goal: Risk for impaired skin integrity will decrease Outcome: Progressing   

## 2017-12-18 NOTE — Progress Notes (Signed)
Inpatient Diabetes Program Recommendations  AACE/[name]: New Consensus Statement on Inpatient Glycemic Control (2015)  Target Ranges:  Prepandial:   less than 140 mg/dL      Peak postprandial:   less than 180 mg/dL (1-2 hours)      Critically ill patients:  140 - 180 mg/dL   Lab Results  Component Value Date   GLUCAP 310 (H) 12/18/2017   HGBA1C 5.6 12/02/2017    Review of Glycemic ControlResults for MIKAH, POSS (MRN [id number]) as of 12/18/2017 16:33  Ref. Range 12/18/2017 04:19 12/18/2017 07:23 12/18/2017 11:54 12/18/2017 15:19  Glucose-Capillary Latest Ref Range: 65 - 99 mg/dL 629 (H) 528 (H) 413 (H) 310 (H)    Diabetes history: Type 2 DM  Outpatient Diabetes medications:  Levemir 30 units q HS, Metformin 1000 mg daily Current orders for Inpatient glycemic control:  Novolog moderate q 4 hours, Levemir 30 units q HS Solumedrol 60 mg IV q 8 hours, Vital at 45 ml /hr Inpatient Diabetes Program Recommendations:   Note blood sugars> 300 mg/dL.  Discussed with RN.  Consider ICU glycemic control order set phase 2- IV insulin. If IV insulin not started, consider adding Novolog tube feed coverage 4 units q 4 hours. Called and discussed with RN.   Thanks,  Beryl Meager, RN, BC-ADM Inpatient Diabetes Coordinator Pager [phone] (8a-5p)

## 2017-12-18 NOTE — Progress Notes (Signed)
Pharmacy Antibiotic Note  Chris [name]. is a 52 y.o. male admitted on 12/14/2017 with dyspnea, likely COPD exacerbation. Transferred to the ICU for severe respiratory distress, almost requiring intubation. Pt continues on fluconazole for oral candidiasis. Pt also has HIV ?imunocompromised, last CD4 40, HIV RNA undetectable. Pt is also on clindamycin/primaquine and linezolid.   Plan: Continue fluconazole 442m IV Q24H F/u renal fxn, C&S, clinical status and LOT F/u ID recommendations   Height: _0  (175.3 cm) Weight: 224 lb 13.9 oz (102 kg) IBW/kg (Calculated) : 70.7  Temp (24hrs), Avg:99.7 F (37.6 C), Min:99 F (37.2 C), Max:100.8 F (38.2 C)  Recent Labs  Lab 12/14/17 1526  12/15/17 0602  12/15/17 2159 12/16/17 0630 12/16/17 1600 12/17/17 0312 12/18/17 0255  WBC 8.9  --  10.6*  --   --  14.7*  --  13.0* 9.6  CREATININE 1.29*   < > 1.32*   < > 1.76* 2.18* 2.24* 1.89* 1.95*   < > = values in this interval not displayed.    Estimated Creatinine Clearance: 52.7 mL/min (A) (by C-G formula based on SCr of 1.95 mg/dL (H)).    Allergies  Allergen Reactions  . Bactrim [Sulfamethoxazole-Trimethoprim] Hives    Antimicrobials this admission: 5/6 doxycycline > 5/7 5/7 fluconazole > 5/7 clinda/primaquine > 5/7 zyvox >  Microbiology results: 5/7 BAL - normal flora 5/7 PCP - NEG 5/7 MRSA - NEG   Jaya Lapka, RRande Lawman5/05/2018 11:29 AM

## 2017-12-18 NOTE — Progress Notes (Signed)
INFECTIOUS DISEASE PROGRESS NOTE  ID: Carren Rang Kellison [name] is a 52 y.o. male with  Principal Problem:   COPD exacerbation ([name]) Active Problems:   Obstructive sleep apnea syndrome   Essential hypertension, benign   Cigarette nicotine dependence without complication   GERD (gastroesophageal reflux disease)   Acute on chronic respiratory failure with hypoxia ([facility])   PAF (paroxysmal atrial fibrillation) ([place])   Diabetes mellitus type 2 in obese ([place])   HIV (human immunodeficiency virus infection) ([place])  Subjective: On vent  Abtx:  Anti-infectives (From admission, onward)   Start     Dose/Rate Route Frequency Ordered Stop   12/17/17 1200  linezolid (ZYVOX) IVPB 600 mg     600 mg 300 mL/hr over 60 Minutes Intravenous Every 12 hours 12/17/17 1034     12/16/17 1000  tenofovir (VIREAD) tablet 300 mg     300 mg Oral Daily 12/15/17 1613     12/16/17 1000  emtricitabine (EMTRIVA) capsule 200 mg     200 mg Oral Daily 12/15/17 1613     12/15/17 1800  clindamycin (CLEOCIN) IVPB 900 mg     900 mg 100 mL/hr over 30 Minutes Intravenous Every 8 hours 12/15/17 1624     12/15/17 1700  dapsone tablet 100 mg  Status:  Discontinued     100 mg Oral Daily 12/15/17 1613 12/15/17 1624   12/15/17 1700  dolutegravir (TIVICAY) tablet 50 mg     50 mg Oral Daily 12/15/17 1613     12/15/17 1630  primaquine tablet 30 mg     30 mg Per Tube Daily 12/15/17 1624     12/15/17 1100  fluconazole (DIFLUCAN) IVPB 400 mg     400 mg 100 mL/hr over 120 Minutes Intravenous Every 24 hours 12/15/17 0923     12/14/17 1900  doxycycline (VIBRA-TABS) tablet 100 mg  Status:  Discontinued     100 mg Oral Every 12 hours 12/14/17 1822 12/16/17 0851   12/14/17 1830  bictegravir-emtricitabine-tenofovir AF (BIKTARVY) 50-200-25 MG per tablet 1 tablet  Status:  Discontinued     1 tablet Oral Daily 12/14/17 1819 12/15/17 1613      Medications:  Scheduled: . apixaban  5 mg Per Tube BID  . chlorhexidine gluconate (MEDLINE KIT)   15 mL Mouth Rinse BID  . diltiazem  60 mg Oral Q6H  . docusate  100 mg Per Tube BID  . dolutegravir  50 mg Oral Daily  . tenofovir  300 mg Oral Daily   Or  . emtricitabine  200 mg Oral Daily  . feeding supplement (PRO-STAT SUGAR FREE 64)  30 mL Per Tube QID  . feeding supplement (VITAL HIGH PROTEIN)  1,000 mL Per Tube Q24H  . insulin aspart  0-15 Units Subcutaneous Q4H  . insulin detemir  30 Units Subcutaneous QHS  . ipratropium  0.5 mg Nebulization Q6H  . levalbuterol  0.63 mg Nebulization Q6H  . mouth rinse  15 mL Mouth Rinse 10 times per day  . methylPREDNISolone (SOLU-MEDROL) injection  60 mg Intravenous Q8H  . pantoprazole sodium  40 mg Per Tube Q1200  . primaquine  30 mg Per Tube Daily  . sodium chloride flush  3 mL Intravenous Q12H    Objective: Vital signs in last 24 hours: Temp:  [99 F (37.2 C)-100.8 F (38.2 C)] 100.8 F (38.2 C) (05/10 0725) Pulse Rate:  [44-90] 87 (05/10 1132) Resp:  [15-35] 30 (05/10 1132) BP: (99-144)/(56-88) 137/83 (05/10 1132) SpO2:  [92 %-100 %]  95 % (05/10 1132) FiO2 (%):  [40 %-70 %] 40 % (05/10 1132) Weight:  [102 kg (224 lb 13.9 oz)] 102 kg (224 lb 13.9 oz) (05/10 0400)   General appearance: no distress Resp: clear to auscultation bilaterally Cardio: regular rate and rhythm GI: abnormal findings:  distended and hypoactive bowel sounds  Lab Results Recent Labs    12/17/17 0312 12/18/17 0255  WBC 13.0* 9.6  HGB 10.6* 11.1*  HCT 35.3* 36.9*  NA 144 145  K 4.7 4.8  CL 104 106  CO2 30 27  BUN 63* 85*  CREATININE 1.89* 1.95*   Liver Panel No results for input(s): PROT, ALBUMIN, AST, ALT, ALKPHOS, BILITOT, BILIDIR, IBILI in the last 72 hours. Sedimentation Rate No results for input(s): ESRSEDRATE in the last 72 hours. C-Reactive Protein No results for input(s): CRP in the last 72 hours.  Microbiology: Recent Results (from the past 240 hour(s))  MRSA PCR Screening     Status: None   Collection Time: 12/15/17  9:32 AM    Result Value Ref Range Status   MRSA by PCR NEGATIVE NEGATIVE Final    Comment:        The GeneXpert MRSA Assay (FDA approved for NASAL specimens only), is one component of a comprehensive MRSA colonization surveillance program. It is not intended to diagnose MRSA infection nor to guide or monitor treatment for MRSA infections. Performed at [facility], [place] [address] [place], [place] [address]   Pneumocystis smear by DFA     Status: None   Collection Time: 12/15/17  4:05 PM  Result Value Ref Range Status   Specimen Source-PJSRC BRONCHIAL ALVEOLAR LAVAGE  Final   Pneumocystis jiroveci Ag NEGATIVE  Final    Comment: Performed at [facility] Performed at [facility], [address], [place], [place] [address]   Culture, bal-quantitative     Status: Abnormal   Collection Time: 12/15/17  6:24 PM  Result Value Ref Range Status   Specimen Description BRONCHIAL ALVEOLAR LAVAGE  Final   Special Requests NONE  Final   Gram Stain   Final    ABUNDANT WBC PRESENT,BOTH PMN AND MONONUCLEAR FEW GRAM POSITIVE COCCI    Culture (A)  Final    60,000 COLONIES/mL Consistent with normal respiratory flora. Performed at [facility], [place] [address], [place], [place] [address]    Report Status 12/18/2017 FINAL  Final    Studies/Results: Dg Chest Port 1 View  Result Date: 12/18/2017 CLINICAL DATA:  Endotracheally intubated. EXAM: PORTABLE CHEST 1 VIEW COMPARISON:  12/17/2017 FINDINGS: Endotracheal tube terminates midway between the clavicles and carina, unchanged. Enteric tube courses into the left upper abdomen with tip not imaged. The cardiomediastinal silhouette is unchanged. Mild bibasilar opacities has not significantly changed and are at least partially chronic based on radiographs from prior years. No overt edema, sizable pleural effusion, or pneumothorax is identified. IMPRESSION: 1. Unchanged and satisfactory position of endotracheal tube. 2. Unchanged mild  bibasilar opacities which may reflect atelectasis and mild chronic lung changes. Electronically Signed   By: Logan Bores M.D.   On: 12/18/2017 11:31   Dg Chest Port 1 View  Result Date: 12/17/2017 CLINICAL DATA:  Check endotracheal tube placement EXAM: PORTABLE CHEST 1 VIEW COMPARISON:  12/16/2017 FINDINGS: Endotracheal tube and nasogastric catheter are again noted and stable. The lungs are well aerated bilaterally. The previously seen basilar changes have improved in the interval. Cardiac shadow is stable. No bony abnormality is noted. IMPRESSION:  Improving aeration in the bases bilaterally. Electronically Signed   By: Inez Catalina M.D.   On: 12/17/2017 10:14     Assessment/Plan: AIDS (CD4 60) COPD gold 3 Candida- on bronch CKD2  Total days of antibiotics:3clinda/primaquin, flucon.  tivicay/truvada  BAL resp flora Will stop zyvox.  Low grade temp this AM.  PCP stain (-). Will stop clinda/primaquin Treat as copd?          Bobby Rumpf MD, FACP Infectious Diseases (pager) [phone] [email or web address] 12/18/2017, 11:47 AM  LOS: 4 days

## 2017-12-18 NOTE — Progress Notes (Signed)
Dr. Marchelle Gearing called for diabetes coordinator about CBGs in 200s-300s.

## 2017-12-19 LAB — TROPONIN I: Troponin I: <0.03 ng/mL (ref <0.03)

## 2017-12-19 LAB — CBC WITH DIFFERENTIAL/PLATELET
Basophils Absolute: 0 10*3/uL (ref 0.0–0.1)
Basophils Relative: 0 %
EOS PCT: 0 %
Eosinophils Absolute: 0 10*3/uL (ref 0.0–0.7)
HCT: 37.3 % — ABNORMAL LOW (ref 39.0–52.0)
Hemoglobin: 11.1 g/dL — ABNORMAL LOW (ref 13.0–17.0)
LYMPHS ABS: 0.9 10*3/uL (ref 0.7–4.0)
Lymphocytes Relative: 9 %
MCH: 29.2 pg (ref 26.0–34.0)
MCHC: 29.8 g/dL — AB (ref 30.0–36.0)
MCV: 98.2 fL (ref 78.0–100.0)
MONO ABS: 1.3 10*3/uL — AB (ref 0.1–1.0)
MONOS PCT: 14 %
Neutro Abs: 7.3 10*3/uL (ref 1.7–7.7)
Neutrophils Relative %: 77 %
PLATELETS: 232 10*3/uL (ref 150–400)
RBC: 3.8 MIL/uL — ABNORMAL LOW (ref 4.22–5.81)
RDW: 14.3 % (ref 11.5–15.5)
WBC: 9.5 10*3/uL (ref 4.0–10.5)

## 2017-12-19 LAB — BASIC METABOLIC PANEL
ANION GAP: 9 (ref 5–15)
BUN: 89 mg/dL — AB (ref 6–20)
CHLORIDE: 114 mmol/L — AB (ref 101–111)
CO2: 28 mmol/L (ref 22–32)
Calcium: 9.5 mg/dL (ref 8.9–10.3)
Creatinine, Ser: 1.74 mg/dL — ABNORMAL HIGH (ref 0.61–1.24)
GFR calc Af Amer: 51 mL/min — ABNORMAL LOW (ref >60)
GFR, EST NON AFRICAN AMERICAN: 44 mL/min — AB (ref >60)
GLUCOSE: 330 mg/dL — AB (ref 65–99)
Potassium: 4.8 mmol/L (ref 3.5–5.1)
Sodium: 151 mmol/L — ABNORMAL HIGH (ref 135–145)

## 2017-12-19 LAB — GLUCOSE, CAPILLARY
GLUCOSE-CAPILLARY: 147 mg/dL — AB (ref 65–99)
GLUCOSE-CAPILLARY: 171 mg/dL — AB (ref 65–99)
GLUCOSE-CAPILLARY: 180 mg/dL — AB (ref 65–99)
GLUCOSE-CAPILLARY: 193 mg/dL — AB (ref 65–99)
GLUCOSE-CAPILLARY: 261 mg/dL — AB (ref 65–99)
Glucose-Capillary: 285 mg/dL — ABNORMAL HIGH (ref 65–99)
Glucose-Capillary: 216 mg/dL — ABNORMAL HIGH (ref 65–99)
Glucose-Capillary: 193 mg/dL — ABNORMAL HIGH (ref 65–99)
Glucose-Capillary: 163 mg/dL — ABNORMAL HIGH (ref 65–99)
Glucose-Capillary: 167 mg/dL — ABNORMAL HIGH (ref 65–99)
Glucose-Capillary: 191 mg/dL — ABNORMAL HIGH (ref 65–99)

## 2017-12-19 LAB — MAGNESIUM: Magnesium: 2.7 mg/dL — ABNORMAL HIGH (ref 1.7–2.4)

## 2017-12-19 LAB — PHOSPHORUS: PHOSPHORUS: 3.2 mg/dL (ref 2.5–4.6)

## 2017-12-19 MED ORDER — DEXMEDETOMIDINE HCL IN NACL 400 MCG/100ML IV SOLN
0.4000 ug/kg/h | INTRAVENOUS | Status: DC
  Administered 2017-12-19 – 2017-12-21 (×13): 1.2 ug/kg/h via INTRAVENOUS
  Filled 2017-12-19 (×14): qty 100

## 2017-12-19 MED ORDER — FREE WATER
200.0000 mL | Freq: Three times a day (TID) | Status: DC
  Administered 2017-12-19 – 2017-12-20 (×4): 200 mL

## 2017-12-19 MED ORDER — SODIUM CHLORIDE 0.9 % IV SOLN: 0.4000 ug/kg/h | INTRAVENOUS | Status: DC

## 2017-12-19 MED ORDER — INSULIN ASPART 100 UNIT/ML ~~LOC~~ SOLN: 3.0000 [IU] | SUBCUTANEOUS | Status: DC

## 2017-12-19 MED ORDER — SODIUM CHLORIDE 0.9 % IV SOLN
INTRAVENOUS | Status: DC
  Administered 2017-12-19: 2.3 [IU]/h via INTRAVENOUS
  Administered 2017-12-20: 5.5 [IU]/h via INTRAVENOUS
  Filled 2017-12-19: qty 1

## 2017-12-19 MED ORDER — DEXMEDETOMIDINE HCL IN NACL 200 MCG/50ML IV SOLN: 0.4000 ug/kg/h | INTRAVENOUS | Status: DC

## 2017-12-19 MED ORDER — ACETAMINOPHEN 160 MG/5ML PO SOLN
650.0000 mg | Freq: Four times a day (QID) | ORAL | Status: DC | PRN
  Administered 2017-12-19 – 2017-12-20 (×2): 650 mg
  Filled 2017-12-19 (×2): qty 20.3

## 2017-12-19 NOTE — Progress Notes (Signed)
   Got agitated. And could not do sbt Pm re-rounds -> stable  Dr. Kalman Shan, M.D., [facility].C.P Pulmonary and Critical Care Medicine Staff Physician, [facility] Director - Interstitial Lung Disease  Program  Pulmonary Fibrosis [facility] [place], [place], [address]  Pager: [phone], If no answer or between  15:00h - 7:00h: call [phone] Telephone: [phone]   '

## 2017-12-19 NOTE — Progress Notes (Signed)
PULMONARY / CRITICAL CARE MEDICINE   Name: Chris [name]. MRN: [id number] DOB: [birth year]    ADMISSION DATE:  12/14/2017 CONSULTATION DATE: 5/7 LOS 5 days  REFERRING MD:  [email or web address]  CHIEF COMPLAINT:  Acute on chronic respiratory failure   BRIEF   This is a 52 year old male patient with a known history of gold 3 chronic obstructive pulmonary disease, tobacco abuse, chronic diastolic heart failure , And atrial fibrillation rate controlled and on apixaban.  He was recently discharged from the hospital back in March 2019 following a prolonged hospital course initially for influenza pneumonia resulting in ARDS, further complicated by atrial fib with RVR, acute kidney injury requiring dialysis, esophageal candidiasis, and ultimately tracheostomy placement to assist with liberation from ventilator.  He went to [facility] where he was eventually weaned from the vent, he may renal recovery, and was decannulated.  He was seen in our office for hospital follow-up on 5/1.  Office note reports he was advancing with physical therapy twice weekly, requiring PRN oxygen usually with exertion, and had been cutting down on cigarettes to 4 cigarettes a day.  He was noted to have thrush at that time and prescribed nystatin.  He presented to the emergency room on 5/6 with chief complaint of worsening shortness of breath.  This apparently started approximately 3 days prior to presentation, there was associated cough productive of white sputum, no chest pain, required more frequent use of rescue bronchodilators which initially gave him relief however over the days to follow did not.  He denies any recent sick contacts, fevers or chills.  He did have mild swelling in his legs but no significant edema.  He still actively smoking.  In the ED chest x-ray demonstrated some right greater than left interstitial prominence markings fairly similar to previous films.  BNP was negative, he had no significant leukocytosis, arterial  blood gas showed mild acute hypercarbic respiratory failure he was initially treated with noninvasive ventilation which he ultimately took off and did not place back on.  He was also placed on systemic steroids, scheduled bronchodilators, empiric doxycycline, supplemental oxygen, and was given 1 dose of furosemide.  Over the course of the evening he continued to have significant work of breathing.  Apparently was not agreeable to using BiPAP at one point, and slowly developed worsening increased work of breathing by the a.m. hours of 5/7.  Critical care was consulted the a.m. of 5/7 as patient developed rather acute respiratory distress.  This had slowly become more progressive.  Upon time of critical care arrival the patient was sitting up on the side of the bed.  Had marked accessory use, and pronounced upper airway wheezing.     CULTURES:   ANTIBIOTICS: Doxy 5/6>>> Fluconazole 5/7>>> Clinda 5/7>>> Anti-retrovirals 5/7>>>  SIGNIFICANT EVENTS: 5/9 - Agitated overnight and sedated, no event overnight  5/10 - On precedex gtt and fent gtt . -> on WUA and can follow commands on sedation. Gets agitated when sedation weaned. Not done SBT because PEEP 10. Not on pressors. Temp 100.44F - diaphoretic. Not on HD/CRRT. MAking urine.     SUBJECTIVE/OVERNIGHT/INTERVAL HX 12/19/17 - ID stopped zyvox, clinda, primaquin yesterday. AKI better today . On  Free water. Per RN - agittaed overnight. WUA pending this AM -. SEt off apnea alarms on fent gtt and precedex gtt.   VITAL SIGNS: BP 130/81   Pulse 82   Temp 99.8 F (37.7 C) (Oral)   Resp (!) 30   Ht  (1.753  m)   Wt 100.1 kg (220 lb 10.9 oz)   SpO2 97%   BMI 32.59 kg/m   HEMODYNAMICS:    VENTILATOR SETTINGS: Vent Mode: PRVC FiO2 (%):  [40 %] 40 % Set Rate:  [30 bmp] 30 bmp Vt Set:  [570 mL] 570 mL PEEP:  [7 cmH20] 7 cmH20 Plateau Pressure:  [23 cmH20-25 cmH20] 24 cmH20  INTAKE / OUTPUT: I/O last 3 completed shifts: In: 4782  [I.V.:2127; NG/GT:1590; IV Piggyback:900] Out: 7005 [Urine:7005]  PHYSICAL EXAMINATION:  General Appearance:    Looks criticall ill OBESE - +  Head:    Normocephalic, without obvious abnormality, atraumatic  Eyes:    PERRL - yes, conjunctiva/corneas - clear      Ears:    Normal external ear canals, both ears  Nose:   NG tube - no  Throat:  ETT TUBE - yes , OG tube - yes  Neck:   Supple,  No enlargement/tenderness/nodules     Lungs:     Clear to auscultation bilaterally, Ventilator   Synchrony - yes  Chest wall:    No deformity  Heart:    S1 and S2 normal, no murmur, CVP - n.  Pressors - no  Abdomen:     Soft, no masses, no organomegaly  Genitalia:    Not done  Rectal:   not done  Extremities:   Extremities- intact     Skin:   Intact in exposed areas .     Neurologic:   Sedation - precedex gtt / fent gtt -> RASS - -3 . Moves all 4s - yes. CAM-ICU - na . Orientation - na        Neurologic:   Sedation - none -> RASS - -3 on fent and precredex gtt. Moves all 4s - . CAM-ICU - na . Orientation - na       PULMONARY Recent Labs  Lab 12/14/17 1550  12/15/17 1504 12/15/17 1855 12/17/17 0353 12/18/17 0415 12/18/17 0517  PHART  --    < > 7.117* 7.316* 7.350 7.345* 7.380  PCO2ART  --    < > 105* 58.5* 54.4* 56.8* 52.9*  PO2ART  --    < > 218* 71.0* 64.0* 45.0* 94.0  HCO3  --    < > 32.5* 29.9* 30.1* 30.8* 31.3*  TCO2 27  --   --  32 32 32 33*  O2SAT  --    < > 98.9 92.0 90.0 75.0 97.0   < > = values in this interval not displayed.    CBC Recent Labs  Lab 12/17/17 0312 12/18/17 0255 12/19/17 0248  HGB 10.6* 11.1* 11.1*  HCT 35.3* 36.9* 37.3*  WBC 13.0* 9.6 9.5  PLT 247 237 232    COAGULATION No results for input(s): INR in the last 168 hours.  CARDIAC   Recent Labs  Lab 12/15/17 0012 12/15/17 0602 12/15/17 0953 12/19/17 0248  TROPONINI <0.03 <0.03 <0.03 <0.03   No results for input(s): PROBNP in the last 168 hours.   CHEMISTRY Recent Labs  Lab  12/16/17 0630 12/16/17 1600 12/17/17 0312 12/18/17 0255 12/19/17 0054 12/19/17 0248  NA 139 141 144 145 151*  --   K 5.5* 5.2* 4.7 4.8 4.8  --   CL 102 105 104 106 114*  --   CO2 --   GLUCOSE 184* 190* 171* 344* 330*  --   BUN 45* 55* 63* 85* 89*  --   CREATININE 2.18* 2.24* 1.89*  1.95* 1.74*  --   CALCIUM 9.3 9.4 9.3 9.1 9.5  --   MG  --   --  2.6* 2.6*  --  2.7*  PHOS  --   --  4.5 3.8  --  3.2   Estimated Creatinine Clearance: 58.6 mL/min (A) (by C-G formula based on SCr of 1.74 mg/dL (H)).   LIVER Recent Labs  Lab 12/14/17 1526  AST 21  ALT 20  ALKPHOS 46  BILITOT 0.3  PROT 7.7  ALBUMIN 3.4*     INFECTIOUS Recent Labs  Lab 12/15/17 0953  PROCALCITON 0.10     ENDOCRINE CBG (last 3)  Recent Labs    12/18/17 1927 12/18/17 2301 12/19/17 0320  GLUCAP 284* 302* 261*         IMAGING x48h  - image(s) personally visualized  -   highlighted in bold Dg Chest Port 1 View  Result Date: 12/18/2017 CLINICAL DATA:  Endotracheally intubated. EXAM: PORTABLE CHEST 1 VIEW COMPARISON:  12/17/2017 FINDINGS: Endotracheal tube terminates midway between the clavicles and carina, unchanged. Enteric tube courses into the left upper abdomen with tip not imaged. The cardiomediastinal silhouette is unchanged. Mild bibasilar opacities has not significantly changed and are at least partially chronic based on radiographs from prior years. No overt edema, sizable pleural effusion, or pneumothorax is identified. IMPRESSION: 1. Unchanged and satisfactory position of endotracheal tube. 2. Unchanged mild bibasilar opacities which may reflect atelectasis and mild chronic lung changes. Electronically Signed   By: Sebastian Ache M.D.   On: 12/18/2017 11:31        DISCUSSION: 52 year old male patient with history of chronic respiratory failure in the setting of stage III chronic obstructive pulmonary disease and ongoing tobacco abuse.  Recently discharged after a prolonged  hospitalization due to influenza pneumonia, resulting in ARDS, prolonged ventilator dependence, and ultimately tracheostomy as well as dialysis requirement.  Now readmitted with what appears to be acute exacerbation of chronic obstructive pulmonary disease further complicated by laryngopharyngeal reflux component as evidenced by loud upper airway wheeze.  We will place him on noninvasive ventilation, continue scheduled bronchodilators, continue systemic steroids, add PPI, also change him to IV fluconazole.  His CD4 count was remarkably lower than times in the past however his viral load was still less than 20.  We will repeat this.  He is at very high risk for intubation.  If intubated he would benefit from bronchoscopy with bronchial alveolar lavage  ASSESSMENT / PLAN:  Acute on chronic hypoxic and hypercarbic respiratory failure in the setting of acute exacerbation of chronic obstructive pulmonary disease and what appears to be significant component of laryngal pharyngeal reflux disease as evidenced by marked upper airway wheezing.  -oral candidiasis -Ongoing tobacco abuse -I think upper airway component is likely a large contributing factor to his acute exacerbation.  Doubt active smoking is helping.  Also given his recent tracheostomy I wonder about tracheal stenosis potentially contributing to his upper airway burden   12/19/2017 - > does NOT meet criteria for SBT/Extubation in setting of Acute Respiratory Failure due to AECOPD though improved but currently due to sedation v agitated delirium   Plan Full vent support (reduce peep 7 oto 5 and RR 30 to 14) WUA and then SBT   History of chronic atrial fibrillation and chronic diastolic heart failure. History of hypertension Is actually currently in sinus rhythm  12/19/17 - maintains BP/HR  Plan Continue telemetry monitoring Rate control as ordered Continue eliquis  Acute on chronic renal failure  with recent AKI dialysis  12/19/17 -  improving AKI  Plan Monitor closely    Anemia of chronic disease without evidence of bleeding  -  Recent Labs  Lab 12/17/17 0312 12/18/17 0255 12/19/17 0248  HGB 10.6* 11.1* 11.1*  HCT 35.3* 36.9* 37.3*  WBC 13.0* 9.6 9.5  PLT 247 237 232      Plan - PRBC for hgb </= 6.9gm%    - exceptions are   -  if ACS susepcted/confirmed then transfuse for hgb </= 8.0gm%,  or    -  active bleeding with hemodynamic instability, then transfuse regardless of hemoglobin value   At at all times try to transfuse 1 unit prbc as possible with exception of active hemorrhage      History of HIV.  Had been adherent with his medical regimen, however his last CD4 count was 40, but his viral load was less than 20.  Plan Per ID consult  Diabetes, poorly controlled with hyperglycemia Plan Sliding scale insulin  History of anxiety and depression Plan Supportive care  FAMILY  - Updates: No family at bedside 12/19/17  - Inter-disciplinary family meet or Palliative Care meeting due by: 5/17      The patient is critically ill with multiple organ systems failure and requires high complexity decision making for assessment and support, frequent evaluation and titration of therapies, application of advanced monitoring technologies and extensive interpretation of multiple databases.   Critical Care Time devoted to patient care services described in this note is  30  Minutes. This time reflects time of care of this signee Dr Kalman Shan. This critical care time does not reflect procedure time, or teaching time or supervisory time of PA/NP/Med student/Med Resident etc but could involve care discussion time    Dr. Kalman Shan, M.D., [facility].C.P Pulmonary and Critical Care Medicine Staff Physician [facility] Pager: [phone], If no answer or between  15:00h - 7:00h: call [phone]  12/19/2017 8:21 AM

## 2017-12-20 ENCOUNTER — Inpatient Hospital Stay (HOSPITAL_COMMUNITY): Payer: Medicare HMO

## 2017-12-20 LAB — GLUCOSE, CAPILLARY
GLUCOSE-CAPILLARY: 174 mg/dL — AB (ref 65–99)
GLUCOSE-CAPILLARY: 179 mg/dL — AB (ref 65–99)
GLUCOSE-CAPILLARY: 157 mg/dL — AB (ref 65–99)
GLUCOSE-CAPILLARY: 158 mg/dL — AB (ref 65–99)
GLUCOSE-CAPILLARY: 152 mg/dL — AB (ref 65–99)
GLUCOSE-CAPILLARY: 160 mg/dL — AB (ref 65–99)
GLUCOSE-CAPILLARY: 129 mg/dL — AB (ref 65–99)
GLUCOSE-CAPILLARY: 129 mg/dL — AB (ref 65–99)
Glucose-Capillary: 169 mg/dL — ABNORMAL HIGH (ref 65–99)
Glucose-Capillary: 176 mg/dL — ABNORMAL HIGH (ref 65–99)
Glucose-Capillary: 177 mg/dL — ABNORMAL HIGH (ref 65–99)
Glucose-Capillary: 195 mg/dL — ABNORMAL HIGH (ref 65–99)
Glucose-Capillary: 196 mg/dL — ABNORMAL HIGH (ref 65–99)
Glucose-Capillary: 204 mg/dL — ABNORMAL HIGH (ref 65–99)
Glucose-Capillary: 214 mg/dL — ABNORMAL HIGH (ref 65–99)

## 2017-12-20 LAB — CBC WITH DIFFERENTIAL/PLATELET
BASOS ABS: 0 10*3/uL (ref 0.0–0.1)
Basophils Relative: 0 %
Eosinophils Absolute: 0 10*3/uL (ref 0.0–0.7)
Eosinophils Relative: 0 %
HCT: 39.4 % (ref 39.0–52.0)
HEMOGLOBIN: 11.1 g/dL — AB (ref 13.0–17.0)
LYMPHS PCT: 8 %
Lymphs Abs: 1.1 10*3/uL (ref 0.7–4.0)
MCH: 28.8 pg (ref 26.0–34.0)
MCHC: 28.2 g/dL — ABNORMAL LOW (ref 30.0–36.0)
MCV: 102.3 fL — AB (ref 78.0–100.0)
Monocytes Absolute: 1.4 10*3/uL — ABNORMAL HIGH (ref 0.1–1.0)
Monocytes Relative: 10 %
NEUTROS ABS: 11.4 10*3/uL — AB (ref 1.7–7.7)
NEUTROS PCT: 82 %
PLATELETS: 230 10*3/uL (ref 150–400)
RBC: 3.85 MIL/uL — AB (ref 4.22–5.81)
RDW: 14.3 % (ref 11.5–15.5)
WBC: 13.9 10*3/uL — AB (ref 4.0–10.5)

## 2017-12-20 LAB — MAGNESIUM: Magnesium: 2.8 mg/dL — ABNORMAL HIGH (ref 1.7–2.4)

## 2017-12-20 LAB — PHOSPHORUS: Phosphorus: 4.3 mg/dL (ref 2.5–4.6)

## 2017-12-20 MED ORDER — MIDAZOLAM HCL 2 MG/2ML IJ SOLN
INTRAMUSCULAR | Status: AC
  Filled 2017-12-20: qty 2

## 2017-12-20 MED ORDER — INSULIN DETEMIR 100 UNIT/ML ~~LOC~~ SOLN
15.0000 [IU] | Freq: Two times a day (BID) | SUBCUTANEOUS | Status: DC
  Administered 2017-12-20 – 2017-12-23 (×7): 15 [IU] via SUBCUTANEOUS
  Filled 2017-12-20 (×8): qty 0.15

## 2017-12-20 MED ORDER — AMITRIPTYLINE HCL 50 MG PO TABS
100.0000 mg | ORAL_TABLET | Freq: Every day | ORAL | Status: DC
  Administered 2017-12-20 – 2017-12-28 (×9): 100 mg via ORAL
  Filled 2017-12-20 (×3): qty 2
  Filled 2017-12-20: qty 1
  Filled 2017-12-20 (×3): qty 2
  Filled 2017-12-20 (×2): qty 1
  Filled 2017-12-20: qty 2

## 2017-12-20 MED ORDER — FUROSEMIDE 10 MG/ML IJ SOLN
20.0000 mg | Freq: Once | INTRAMUSCULAR | Status: AC
  Administered 2017-12-20: 20 mg via INTRAVENOUS
  Filled 2017-12-20: qty 2

## 2017-12-20 MED ORDER — MIDAZOLAM HCL 2 MG/2ML IJ SOLN
1.0000 mg | INTRAMUSCULAR | Status: DC | PRN
  Administered 2017-12-20 (×2): 2 mg via INTRAVENOUS
  Filled 2017-12-20: qty 2

## 2017-12-20 MED ORDER — INSULIN ASPART 100 UNIT/ML ~~LOC~~ SOLN
3.0000 [IU] | SUBCUTANEOUS | Status: DC
  Administered 2017-12-20: 9 [IU] via SUBCUTANEOUS
  Administered 2017-12-20 (×2): 6 [IU] via SUBCUTANEOUS
  Administered 2017-12-20: 3 [IU] via SUBCUTANEOUS
  Administered 2017-12-21 (×2): 6 [IU] via SUBCUTANEOUS
  Administered 2017-12-21 – 2017-12-22 (×5): 9 [IU] via SUBCUTANEOUS
  Administered 2017-12-22: 6 [IU] via SUBCUTANEOUS
  Administered 2017-12-22: 9 [IU] via SUBCUTANEOUS
  Administered 2017-12-22: 3 [IU] via SUBCUTANEOUS
  Administered 2017-12-23: 9 [IU] via SUBCUTANEOUS
  Administered 2017-12-23: 3 [IU] via SUBCUTANEOUS
  Administered 2017-12-23: 9 [IU] via SUBCUTANEOUS
  Administered 2017-12-23: 6 [IU] via SUBCUTANEOUS
  Administered 2017-12-23: 3 [IU] via SUBCUTANEOUS

## 2017-12-20 MED ORDER — ORAL CARE MOUTH RINSE
15.0000 mL | OROMUCOSAL | Status: DC
  Administered 2017-12-20 – 2017-12-21 (×3): 15 mL via OROMUCOSAL

## 2017-12-20 MED ORDER — DEXTROSE 10 % IV SOLN
INTRAVENOUS | Status: DC
  Administered 2017-12-20: 13:00:00 via INTRAVENOUS

## 2017-12-20 NOTE — Procedures (Signed)
Extubation Procedure Note  Patient Details:   Name: Chris [name]. DOB: [birth year] MRN: [id number]   Airway Documentation:    Vent end date: 12/20/17 Vent end time: 0930   Evaluation  O2 sats: stable throughout Complications: No apparent complications Patient did tolerate procedure well. Bilateral Breath Sounds: Diminished   Yes  Pt extubated initially to 4L [place] increased to 6L then changed to 10L HFNC due sats mid 80's No stridor noted.[name] [facility] CTA Diminished, chest rise equal. NARD, pt verbal and responsive, emotionally upset. Pt educated on potential use of BIPAP to sustain oxygenation. Pt refused to wear at this time MD aware. Will cont to monitor.    Betsy Pries 12/20/2017, 10:11 AM

## 2017-12-20 NOTE — Progress Notes (Signed)
Pt w/ increased agitation, desynchronous with the vent, o2 sats in mid to high 80s; HR 140s. Pts sedation maxed out at 400 fentanyl & 1.2 precedex w/ PRN fentanyl bolus given. Pt still agitated, elink notified. Received orders for PRN versed 2 mg q2h. Orders carried out. Nursing will continue to monitor.

## 2017-12-20 NOTE — Progress Notes (Signed)
Pts LFA IV found to be leaking. IV removed. IV team consulted for new PIV. Consult complete; pt with new RFA 22G PIV. Sedation administered through this new IV site. Pt comfortable, synchronous with the vent & cooperative. RN will begin triturating down on sedation as pt tolerates. Will continue to monitor.

## 2017-12-20 NOTE — Progress Notes (Addendum)
PULMONARY / CRITICAL CARE MEDICINE   Name: Chris [name]. MRN: [id number] DOB: Mar 15, 1966    ADMISSION DATE:  12/14/2017 CONSULTATION DATE: 5/7 LOS 6 days  REFERRING MD:  [email or web address]  CHIEF COMPLAINT:  Acute on chronic respiratory failure   BRIEF   This is a 52 year old male patient with a known history of gold 3 chronic obstructive pulmonary disease, tobacco abuse, chronic diastolic heart failure , And atrial fibrillation rate controlled and on apixaban.  He was recently discharged from the hospital back in March 2019 following a prolonged hospital course initially for influenza pneumonia resulting in ARDS, further complicated by atrial fib with RVR, acute kidney injury requiring dialysis, esophageal candidiasis, and ultimately tracheostomy placement to assist with liberation from ventilator.  He went to [facility] where he was eventually weaned from the vent, he may renal recovery, and was decannulated.  He was seen in our office for hospital follow-up on 5/1.  Office note reports he was advancing with physical therapy twice weekly, requiring PRN oxygen usually with exertion, and had been cutting down on cigarettes to 4 cigarettes a day.  He was noted to have thrush at that time and prescribed nystatin.  He presented to the emergency room on 5/6 with chief complaint of worsening shortness of breath.  This apparently started approximately 3 days prior to presentation, there was associated cough productive of white sputum, no chest pain, required more frequent use of rescue bronchodilators which initially gave him relief however over the days to follow did not.  He denies any recent sick contacts, fevers or chills.  He did have mild swelling in his legs but no significant edema.  He still actively smoking.  In the ED chest x-ray demonstrated some right greater than left interstitial prominence markings fairly similar to previous films.  BNP was negative, he had no significant leukocytosis, arterial  blood gas showed mild acute hypercarbic respiratory failure he was initially treated with noninvasive ventilation which he ultimately took off and did not place back on.  He was also placed on systemic steroids, scheduled bronchodilators, empiric doxycycline, supplemental oxygen, and was given 1 dose of furosemide.  Over the course of the evening he continued to have significant work of breathing.  Apparently was not agreeable to using BiPAP at one point, and slowly developed worsening increased work of breathing by the a.m. hours of 5/7.  Critical care was consulted the a.m. of 5/7 as patient developed rather acute respiratory distress.  This had slowly become more progressive.  Upon time of critical care arrival the patient was sitting up on the side of the bed.  Had marked accessory use, and pronounced upper airway wheezing.     CULTURES: Results for orders placed or performed during the hospital encounter of 12/14/17  MRSA PCR Screening     Status: None   Collection Time: 12/15/17  9:32 AM  Result Value Ref Range Status   MRSA by PCR NEGATIVE NEGATIVE Final    Comment:        The GeneXpert MRSA Assay (FDA approved for NASAL specimens only), is one component of a comprehensive MRSA colonization surveillance program. It is not intended to diagnose MRSA infection nor to guide or monitor treatment for MRSA infections. Performed at [facility], [place] [address], [place], [place] [address]   Pneumocystis smear by DFA     Status: None   Collection Time: 12/15/17  4:05 PM  Result Value Ref Range Status   Specimen [facility] BRONCHIAL  ALVEOLAR LAVAGE  Final   Pneumocystis jiroveci Ag NEGATIVE  Final    Comment: Performed at [facility] Performed at [facility], [address], [place], [place] [address]   Culture, bal-quantitative     Status: Abnormal   Collection Time: 12/15/17  6:24 PM  Result Value Ref Range Status   Specimen Description BRONCHIAL ALVEOLAR LAVAGE   Final   Special Requests NONE  Final   Gram Stain   Final    ABUNDANT WBC PRESENT,BOTH PMN AND MONONUCLEAR FEW GRAM POSITIVE COCCI    Culture (A)  Final    60,000 COLONIES/mL Consistent with normal respiratory flora. Performed at [facility], [place] [address] [place], [place] [address]    Report Status 12/18/2017 FINAL  Final     ANTIBIOTICS: Doxy 5/6>>> ? Stop date Fluconazole 5/7>>> Clinda 5/7>>> 5/10 Anti-retrovirals 5/7>>>  SIGNIFICANT EVENTS: 5/9 - Agitated overnight and sedated, no event overnight  5/10 - On precedex gtt and fent gtt . -> on WUA and can follow commands on sedation. Gets agitated when sedation weaned. Not done SBT because PEEP 10. Not on pressors. Temp 100.61F - diaphoretic. Not on HD/CRRT. MAking urine.   12/19/17 - ID stopped zyvox, clinda, primaquin yesterday. AKI better today . On  Free water. Per RN - agittaed overnight. WUA pending this AM -. SEt off apnea alarms on fent gtt and precedex gtt.     SUBJECTIVE/OVERNIGHT/INTERVAL HX 5/12 - Was confused last night. weaned down tto fent 271mg gtt and still on precedex - >a wake and demanding extubation . Not started on SBT yet. Not onpressors. Making urine.   VITAL SIGNS: BP 117/69   Pulse 72   Temp 100.3 F (37.9 C) (Oral)   Resp 12   Ht _0  (1.753 m)   Wt 101.3 kg (223 lb 5.2 oz)   SpO2 93%   BMI 32.98 kg/m   HEMODYNAMICS:    VENTILATOR SETTINGS: Vent Mode: PRVC FiO2 (%):  [40 %] 40 % Set Rate:  [14 bmp] 14 bmp Vt Set:  [570 mL] 570 mL PEEP:  [5 cmH20] 5 cmH20 Plateau Pressure:  [11 cmH20-15 cmH20] 15 cmH20  INTAKE / OUTPUT: I/O last 3 completed shifts: In: 429[I.V.:2528; NG/GT:1820; IV Piggyback:200] Out: 5430 [Urine:5430]  PHYSICAL EXAMINATION:  General Appearance:    Looks criticall ill OBESE - +  Head:    Normocephalic, without obvious abnormality, atraumatic  Eyes:    PERRL - yes, conjunctiva/corneas - clear      Ears:    Normal external ear canals, both ears   Nose:   NG tube - no  Throat:  ETT TUBE - yes (has track prior scar) , OG tube - yes  Neck:   Supple,  No enlargement/tenderness/nodules     Lungs:     Clear to auscultation bilaterally,   Chest wall:    No deformity  Heart:    S1 and S2 normal, no murmur, CVP - no.  Pressors - no  Abdomen:     Soft, no masses, no organomegaly  Genitalia:    Not done  Rectal:   not done  Extremities:   Extremities- intact     Skin:   Intact in exposed areas .     Neurologic:   Sedation - fent gtt and precedex gtt -> RASS - 0 to +2 . Moves all 4s - yes. CAM-ICU - neg . Orientation - x3+ (has restraints)  PULMONARY Recent Labs  Lab 12/14/17 1550  12/15/17 1504 12/15/17 1855 12/17/17 0353 12/18/17 0415 12/18/17 0517  PHART  --    < > 7.117* 7.316* 7.350 7.345* 7.380  PCO2ART  --    < > 105* 58.5* 54.4* 56.8* 52.9*  PO2ART  --    < > 218* 71.0* 64.0* 45.0* 94.0  HCO3  --    < > 32.5* 29.9* 30.1* 30.8* 31.3*  TCO2 27  --   --  32 32 32 33*  O2SAT  --    < > 98.9 92.0 90.0 75.0 97.0   < > = values in this interval not displayed.    CBC Recent Labs  Lab 12/18/17 0255 12/19/17 0248 12/20/17 0608  HGB 11.1* 11.1* 11.1*  HCT 36.9* 37.3* 39.4  WBC 9.6 9.5 13.9*  PLT 237 232 230    COAGULATION No results for input(s): INR in the last 168 hours.  CARDIAC   Recent Labs  Lab 12/15/17 0012 12/15/17 0602 12/15/17 0953 12/19/17 0248  TROPONINI <0.03 <0.03 <0.03 <0.03   No results for input(s): PROBNP in the last 168 hours.   CHEMISTRY Recent Labs  Lab 12/16/17 0630 12/16/17 1600 12/17/17 0312 12/18/17 0255 12/19/17 0054 12/19/17 0248 12/20/17 0608  NA 139 141 144 145 151*  --   --   K 5.5* 5.2* 4.7 4.8 4.8  --   --   CL 102 105 104 106 114*  --   --   CO2 _0 --   --   GLUCOSE 184* 190* 171* 344* 330*  --   --   BUN 45* 55* 63* 85* 89*  --   --   CREATININE 2.18* 2.24* 1.89* 1.95* 1.74*  --   --   CALCIUM 9.3 9.4 9.3 9.1 9.5  --   --   MG  --    --  2.6* 2.6*  --  2.7* 2.8*  PHOS  --   --  4.5 3.8  --  3.2 4.3   Estimated Creatinine Clearance: 58.9 mL/min (A) (by C-G formula based on SCr of 1.74 mg/dL (H)).   LIVER Recent Labs  Lab 12/14/17 1526  AST 21  ALT 20  ALKPHOS 46  BILITOT 0.3  PROT 7.7  ALBUMIN 3.4*     INFECTIOUS Recent Labs  Lab 12/15/17 0953  PROCALCITON 0.10     ENDOCRINE CBG (last 3)  Recent Labs    12/20/17 0601 12/20/17 0700 12/20/17 0758  GLUCAP 204* 176* 158*         IMAGING x48h  - image(s) personally visualized  -   highlighted in bold Dg Chest Port 1 View  Result Date: 12/20/2017 CLINICAL DATA:  Endotracheal placement. EXAM: PORTABLE CHEST 1 VIEW COMPARISON:  12/18/2017 FINDINGS: Endotracheal tube tip is 4 cm above the carina. Nasogastric tube enters the stomach. Mild cardiomegaly as seen previously. The lungs are clear and well aerated. No edema. IMPRESSION: Endotracheal tube and nasogastric tube well positioned. Lungs clear. Electronically Signed   By: Nelson Chimes M.D.   On: 12/20/2017 07:49        DISCUSSION: 52 year old male patient with history of chronic respiratory failure in the setting of stage III chronic obstructive pulmonary disease and ongoing tobacco abuse.  Recently discharged after a prolonged hospitalization due to influenza pneumonia, resulting in ARDS, prolonged ventilator dependence, and ultimately tracheostomy as well as dialysis requirement.  Now readmitted with what appears to be acute exacerbation of chronic obstructive  pulmonary disease further complicated by laryngopharyngeal reflux component as evidenced by loud upper airway wheeze.  We will place him on noninvasive ventilation, continue scheduled bronchodilators, continue systemic steroids, add PPI, also change him to IV fluconazole.  His CD4 count was remarkably lower than times in the past however his viral load was still less than 20.  We will repeat this.  He is at very high risk for intubation.  If  intubated he would benefit from bronchoscopy with bronchial alveolar lavage  ASSESSMENT / PLAN:  Acute on chronic hypoxic and hypercarbic respiratory failure in the setting of acute exacerbation of chronic obstructive pulmonary disease and what appears to be significant component of laryngal pharyngeal reflux disease as evidenced by marked upper airway wheezing.  -oral candidiasis -Ongoing tobacco abuse -I think upper airway component is likely a large contributing factor to his acute exacerbation.  Doubt active smoking is helping.  Also given his recent tracheostomy I wonder about tracheal stenosis potentially contributing to his upper airway burden   12/20/2017 - > meets SBT criteria. . Being Rx for AECOPD (s/p antibiotics)  Plan Wean sedation down -> SBT -> aim to extubate (hopefully he does not self extubate; I have urged him to stay patient) BD steroids    History of chronic atrial fibrillation and chronic diastolic heart failure. History of hypertension Is actually currently in sinus rhythm  5/12 - maintains BP/HR  Plan Continue telemetry monitoring Rate control as ordered Continue eliquis  Acute on chronic renal failure with recent AKI dialysis  12/19/17 -improvin AKI  Plan Monitor closely Await repeat bmet   Anemia of chronic disease without evidence of bleeding  Recent Labs  Lab 12/18/17 0255 12/19/17 0248 12/20/17 0608  HGB 11.1* 11.1* 11.1*  HCT 36.9* 37.3* 39.4  WBC 9.6 9.5 13.9*  PLT 237 232 230      Plan - - PRBC for hgb </= 6.9gm%    - exceptions are   -  if ACS susepcted/confirmed then transfuse for hgb </= 8.0gm%,  or    -  active bleeding with hemodynamic instability, then transfuse regardless of hemoglobin value   At at all times try to transfuse 1 unit prbc as possible with exception of active hemorrhage    History of HIV.  Had been adherent with his medical regimen, however his last CD4 count was 40, but his viral load was less than  20.  Plan Per ID consult  Diabetes, poorly controlled with hyperglycemia Plan Sliding scale insulin  History of anxiety and depression  12/20/17 - has had agitated delirium since admit needing restrains , fent gtt and precedex. This AM better  Plan Wean fent gtt off Work towards [place] restraints Probably extubate if doing well on precedex    FAMILY  - Updates: No family at bedside 12/19/17. Wife Keisha Tenpas updated on phone from bedside 12/20/17  - Inter-disciplinary family meet or Palliative Care meeting due by: 5/17        The patient is critically ill with multiple organ systems failure and requires high complexity decision making for assessment and support, frequent evaluation and titration of therapies, application of advanced monitoring technologies and extensive interpretation of multiple databases.   Critical Care Time devoted to patient care services described in this note is  30  Minutes. This time reflects time of care of this signee Dr Brand Males. This critical care time does not reflect procedure time, or teaching time or supervisory time of PA/NP/Med student/Med Resident etc but could  involve care discussion time    Dr. Brand Males, M.D., [facility].C.P Pulmonary and Critical Care Medicine Staff Physician [place] [facility] Pager: [phone], If no answer or between  15:00h - 7:00h: call [phone]  12/20/2017 8:35 AM

## 2017-12-20 NOTE — Progress Notes (Signed)
Pm rounds post extubation  Doing well but needing 10L [place] No distress Wants to eat  Plan bipap qhs if he will accept it Lasix  IV  X 1 Continue ICU   Dr. Kalman Shan, M.D., [facility].C.P Pulmonary and Critical Care Medicine Staff Physician, [facility] Director - Interstitial Lung Disease  Program  Pulmonary Fibrosis [facility] at [facility] [place], [place], [address]  Pager: [phone], If no answer or between  15:00h - 7:00h: call [phone] Telephone: [phone]

## 2017-12-21 ENCOUNTER — Inpatient Hospital Stay (HOSPITAL_COMMUNITY): Payer: Medicare HMO

## 2017-12-21 ENCOUNTER — Encounter: Payer: Medicare HMO | Admitting: Podiatry

## 2017-12-21 ENCOUNTER — Telehealth: Payer: Self-pay | Admitting: Nurse Practitioner

## 2017-12-21 DIAGNOSIS — I4891 Unspecified atrial fibrillation: Secondary | ICD-10-CM

## 2017-12-21 DIAGNOSIS — G4733 Obstructive sleep apnea (adult) (pediatric): Secondary | ICD-10-CM

## 2017-12-21 LAB — GLUCOSE, CAPILLARY
GLUCOSE-CAPILLARY: 177 mg/dL — AB (ref 65–99)
GLUCOSE-CAPILLARY: 283 mg/dL — AB (ref 65–99)
GLUCOSE-CAPILLARY: 371 mg/dL — AB (ref 65–99)
GLUCOSE-CAPILLARY: 318 mg/dL — AB (ref 65–99)
Glucose-Capillary: 288 mg/dL — ABNORMAL HIGH (ref 65–99)
Glucose-Capillary: 181 mg/dL — ABNORMAL HIGH (ref 65–99)
Glucose-Capillary: 333 mg/dL — ABNORMAL HIGH (ref 65–99)

## 2017-12-21 LAB — BASIC METABOLIC PANEL
Anion gap: 9 (ref 5–15)
BUN: 72 mg/dL — AB (ref 6–20)
CALCIUM: 9.7 mg/dL (ref 8.9–10.3)
CO2: 30 mmol/L (ref 22–32)
CREATININE: 1.34 mg/dL — AB (ref 0.61–1.24)
Chloride: 107 mmol/L (ref 101–111)
GFR calc Af Amer: >60 mL/min (ref >60)
GFR calc non Af Amer: 60 mL/min — ABNORMAL LOW (ref >60)
GLUCOSE: 203 mg/dL — AB (ref 65–99)
POTASSIUM: 5.2 mmol/L — AB (ref 3.5–5.1)
SODIUM: 146 mmol/L — AB (ref 135–145)

## 2017-12-21 LAB — CBC WITH DIFFERENTIAL/PLATELET
Basophils Absolute: 0 10*3/uL (ref 0.0–0.1)
Basophils Relative: 0 %
EOS PCT: 0 %
Eosinophils Absolute: 0 10*3/uL (ref 0.0–0.7)
HCT: 40.4 % (ref 39.0–52.0)
Hemoglobin: 11.8 g/dL — ABNORMAL LOW (ref 13.0–17.0)
LYMPHS ABS: 1.1 10*3/uL (ref 0.7–4.0)
LYMPHS PCT: 8 %
MCH: 29.1 pg (ref 26.0–34.0)
MCHC: 29.2 g/dL — ABNORMAL LOW (ref 30.0–36.0)
MCV: 99.5 fL (ref 78.0–100.0)
MONO ABS: 1.1 10*3/uL — AB (ref 0.1–1.0)
MONOS PCT: 9 %
Neutro Abs: 11.2 10*3/uL — ABNORMAL HIGH (ref 1.7–7.7)
Neutrophils Relative %: 83 %
PLATELETS: 200 10*3/uL (ref 150–400)
RBC: 4.06 MIL/uL — ABNORMAL LOW (ref 4.22–5.81)
RDW: 13.9 % (ref 11.5–15.5)
WBC: 13.4 10*3/uL — ABNORMAL HIGH (ref 4.0–10.5)

## 2017-12-21 LAB — PHOSPHORUS: Phosphorus: 4.8 mg/dL — ABNORMAL HIGH (ref 2.5–4.6)

## 2017-12-21 LAB — MAGNESIUM: Magnesium: 2.4 mg/dL (ref 1.7–2.4)

## 2017-12-21 MED ORDER — MOMETASONE FURO-FORMOTEROL FUM 200-5 MCG/ACT IN AERO
2.0000 | INHALATION_SPRAY | Freq: Two times a day (BID) | RESPIRATORY_TRACT | Status: DC
  Administered 2017-12-21 – 2017-12-29 (×17): 2 via RESPIRATORY_TRACT
  Filled 2017-12-21 (×3): qty 8.8

## 2017-12-21 MED ORDER — GABAPENTIN 600 MG PO TABS
300.0000 mg | ORAL_TABLET | Freq: Three times a day (TID) | ORAL | Status: DC
  Filled 2017-12-21 (×2): qty 0.5

## 2017-12-21 MED ORDER — ORAL CARE MOUTH RINSE
15.0000 mL | Freq: Two times a day (BID) | OROMUCOSAL | Status: DC
  Administered 2017-12-21 – 2017-12-28 (×12): 15 mL via OROMUCOSAL

## 2017-12-21 MED ORDER — SODIUM POLYSTYRENE SULFONATE 15 GM/60ML PO SUSP
30.0000 g | Freq: Once | ORAL | Status: AC
  Administered 2017-12-21: 30 g via ORAL
  Filled 2017-12-21: qty 120

## 2017-12-21 MED ORDER — FLUCONAZOLE 100 MG PO TABS: 100.0000 mg | ORAL_TABLET | Freq: Every day | ORAL | Status: DC

## 2017-12-21 MED ORDER — PREDNISONE 20 MG PO TABS
40.0000 mg | ORAL_TABLET | Freq: Every day | ORAL | Status: AC
  Administered 2017-12-22 – 2017-12-24 (×3): 40 mg via ORAL
  Filled 2017-12-21 (×3): qty 2

## 2017-12-21 MED ORDER — PREDNISONE 20 MG PO TABS
20.0000 mg | ORAL_TABLET | Freq: Every day | ORAL | Status: AC
  Administered 2017-12-25 – 2017-12-26 (×2): 20 mg via ORAL
  Filled 2017-12-21 (×2): qty 1

## 2017-12-21 MED ORDER — GABAPENTIN 300 MG PO CAPS
300.0000 mg | ORAL_CAPSULE | Freq: Three times a day (TID) | ORAL | Status: DC
  Administered 2017-12-21 – 2017-12-29 (×25): 300 mg via ORAL
  Filled 2017-12-21 (×27): qty 1

## 2017-12-21 MED ORDER — WHITE PETROLATUM EX OINT
TOPICAL_OINTMENT | CUTANEOUS | Status: AC
  Administered 2017-12-21: 0.2
  Filled 2017-12-21: qty 28.35

## 2017-12-21 MED ORDER — DILTIAZEM HCL ER COATED BEADS 240 MG PO CP24
240.0000 mg | ORAL_CAPSULE | Freq: Every day | ORAL | Status: DC
  Administered 2017-12-21 – 2017-12-29 (×9): 240 mg via ORAL
  Filled 2017-12-21 (×9): qty 1

## 2017-12-21 MED ORDER — BICTEGRAVIR-EMTRICITAB-TENOFOV 50-200-25 MG PO TABS
1.0000 | ORAL_TABLET | Freq: Every day | ORAL | Status: DC
  Administered 2017-12-21 – 2017-12-29 (×9): 1 via ORAL
  Filled 2017-12-21 (×10): qty 1
  Filled 2017-12-21: qty 30
  Filled 2017-12-21 (×2): qty 1

## 2017-12-21 MED ORDER — DAPSONE 100 MG PO TABS
100.0000 mg | ORAL_TABLET | Freq: Every day | ORAL | Status: DC
  Administered 2017-12-21 – 2017-12-29 (×9): 100 mg via ORAL
  Filled 2017-12-21 (×10): qty 1

## 2017-12-21 MED ORDER — FLUCONAZOLE 100 MG PO TABS
100.0000 mg | ORAL_TABLET | Freq: Every day | ORAL | Status: DC
  Administered 2017-12-21 – 2017-12-29 (×9): 100 mg via ORAL
  Filled 2017-12-21 (×9): qty 1

## 2017-12-21 NOTE — Telephone Encounter (Signed)
14 page paperwork received through fax 12-21-17.

## 2017-12-21 NOTE — Progress Notes (Addendum)
Second attempt to place patient on BiPAP but he is refusing at this moment so that he can drink his drink. RN aware.

## 2017-12-21 NOTE — Progress Notes (Signed)
[facility] for Infectious Disease  Date of Admission:  12/14/2017     Total days of antibiotics   Day 4 Diflucan         Patient ID: Chris Rang Belizaire [name] is a 52 y.o. male with  Principal Problem:   COPD exacerbation ([place]) Active Problems:   Obstructive sleep apnea syndrome   Essential hypertension, benign   Cigarette nicotine dependence without complication   GERD (gastroesophageal reflux disease)   Acute on chronic respiratory failure with hypoxia ([facility])   PAF (paroxysmal atrial fibrillation) ([place])   Diabetes mellitus type 2 in obese ([place])   HIV (human immunodeficiency virus infection) ([place])   . amitriptyline  100 mg Oral QHS  . apixaban  5 mg Per Tube BID  . chlorhexidine gluconate (MEDLINE KIT)  15 mL Mouth Rinse BID  . diltiazem  60 mg Oral Q6H  . docusate  100 mg Per Tube BID  . dolutegravir  50 mg Oral Daily  . tenofovir  300 mg Oral Daily   Or  . emtricitabine  200 mg Oral Daily  . insulin aspart  3-9 Units Subcutaneous Q4H  . insulin detemir  15 Units Subcutaneous Q12H  . ipratropium  0.5 mg Nebulization Q6H  . levalbuterol  0.63 mg Nebulization Q6H  . mouth rinse  15 mL Mouth Rinse Q4H  . methylPREDNISolone (SOLU-MEDROL) injection  60 mg Intravenous Q8H  . pantoprazole sodium  40 mg Per Tube Q1200  . sodium chloride flush  3 mL Intravenous Q12H    SUBJECTIVE: Extubated. Would like a ham sandwich. Swallowing pills OK. Not coughing up much and has not yet been out of bed.   Allergies  Allergen Reactions  . Bactrim [Sulfamethoxazole-Trimethoprim] Hives    OBJECTIVE: Vitals:   12/21/17 0500 12/21/17 0645 12/21/17 0726 12/21/17 0800  BP:  110/65  122/76  Pulse:    65  Resp:    (!) 23  Temp:   (!) 97.2 F (36.2 C)   TempSrc:   Axillary   SpO2:   92% 92%  Weight: 229 lb 8 oz (104.1 kg)     Height:       Body mass index is 33.89 kg/m.  Physical Exam  Constitutional: He is oriented to person, place, and time. He appears well-developed and  well-nourished.  Seated comfortably in bed.   HENT:  Mouth/Throat: Oropharynx is clear and moist and mucous membranes are normal. Normal dentition. No dental abscesses.  Eyes: Pupils are equal, round, and reactive to light. No scleral icterus.  Cardiovascular: Normal rate, regular rhythm and normal heart sounds.  No murmur heard. Pulmonary/Chest: Effort normal. No respiratory distress. He has wheezes.  High flow nasal cannula   Abdominal: Soft. He exhibits no distension. There is no tenderness.  Lymphadenopathy:    He has no cervical adenopathy.  Neurological: He is alert and oriented to person, place, and time.  Skin: Skin is warm and dry. No rash noted.  Psychiatric: He has a normal mood and affect. Judgment normal.  Nursing note and vitals reviewed.  Lab Results Lab Results  Component Value Date   WBC 13.4 (H) 12/21/2017   HGB 11.8 (L) 12/21/2017   HCT 40.4 12/21/2017   MCV 99.5 12/21/2017   PLT 200 12/21/2017    Lab Results  Component Value Date   CREATININE 1.34 (H) 12/21/2017   BUN 72 (H) 12/21/2017   NA 146 (H) 12/21/2017   K 5.2 (H) 12/21/2017  CL 107 12/21/2017   CO2 30 12/21/2017    Lab Results  Component Value Date   ALT 20 12/14/2017   AST 21 12/14/2017   ALKPHOS 46 12/14/2017   BILITOT 0.3 12/14/2017     Microbiology: Recent Results (from the past 240 hour(s))  MRSA PCR Screening     Status: None   Collection Time: 12/15/17  9:32 AM  Result Value Ref Range Status   MRSA by PCR NEGATIVE NEGATIVE Final    Comment:        The GeneXpert MRSA Assay (FDA approved for NASAL specimens only), is one component of a comprehensive MRSA colonization surveillance program. It is not intended to diagnose MRSA infection nor to guide or monitor treatment for MRSA infections. Performed at [facility], [place] [address] [place], [place] [address]   Pneumocystis smear by DFA     Status: None   Collection Time: 12/15/17  4:05 PM  Result Value Ref Range  Status   Specimen Source-[facility] BRONCHIAL ALVEOLAR LAVAGE  Final   Pneumocystis jiroveci Ag NEGATIVE  Final    Comment: Performed at [facility] Performed at [facility], [address] [place], [place] [address]   Culture, bal-quantitative     Status: Abnormal   Collection Time: 12/15/17  6:24 PM  Result Value Ref Range Status   Specimen Description BRONCHIAL ALVEOLAR LAVAGE  Final   Special Requests NONE  Final   Gram Stain   Final    ABUNDANT WBC PRESENT,BOTH PMN AND MONONUCLEAR FEW GRAM POSITIVE COCCI    Culture (A)  Final    60,000 COLONIES/mL Consistent with normal respiratory flora. Performed at [facility], [place] [address], [place],  [address]    Report Status 12/18/2017 FINAL  Final    ASSESSMENT: Chris Totherow Everman [name] is a 52 y.o. male with HIV/AIDS and COPD (GOLD 3) and bronchial candiasis. He is extubated and swallowing pills well; unfortunately he has only been getting emtricitabine + dolutegravir w/o tenofovir component. Will restart his Biktarvy today. CD4 low at 60 in setting of acute illness and steroid use - will begin prophylaxis with Dapsone. Will also change Fluconazole to PO.   PLAN: 1. Stop Truvada + Tivicay.  2. Start Biktarvy  3. Start Dapsone 100 mg QD  4. Change Fluconazole to PO  Janene Madeira, MSN, NP-C [facility] for Infectious Disease [facility] Cell: [phone] Pager: [phone]  12/21/2017  9:04 AM

## 2017-12-21 NOTE — Progress Notes (Addendum)
PULMONARY / CRITICAL CARE MEDICINE   Name: Chris [name]. MRN: [id number] DOB: 16-Apr-1966    ADMISSION DATE:  12/14/2017 CONSULTATION DATE: 5/7 LOS 7 days  REFERRING MD:  [email or web address]  CHIEF COMPLAINT:  Acute on chronic respiratory failure   BRIEF   This is a 52 year old male patient with a known history of gold 3 chronic obstructive pulmonary disease, tobacco abuse, chronic diastolic heart failure. And atrial fibrillation rate controlled and on apixaban.  He was recently discharged from the hospital back in March 2019 following a prolonged hospital course initially for influenza pneumonia resulting in ARDS, further complicated by atrial fib with RVR, acute kidney injury requiring dialysis, esophageal candidiasis, and ultimately tracheostomy placement to assist with liberation from ventilator.  He went to [facility] where he was eventually weaned from the vent, he may renal recovery, and was decannulated.  He was seen in our office for hospital follow-up on 5/1.  Office note reports he was advancing with physical therapy twice weekly, requiring PRN oxygen usually with exertion, and had been cutting down on cigarettes to 4 cigarettes a day.  He was noted to have thrush at that time and prescribed nystatin.  He presented to the emergency room on 5/6 with chief complaint of worsening shortness of breath.  This apparently started approximately 3 days prior to presentation, there was associated cough productive of white sputum, no chest pain, required more frequent use of rescue bronchodilators which initially gave him relief however over the days to follow did not.  He denies any recent sick contacts, fevers or chills.  He did have mild swelling in his legs but no significant edema.  He still actively smoking.  In the ED chest x-ray demonstrated some right greater than left interstitial prominence markings fairly similar to previous films.  BNP was negative, he had no significant leukocytosis, arterial  blood gas showed mild acute hypercarbic respiratory failure he was initially treated with noninvasive ventilation which he ultimately took off and did not place back on.  He was also placed on systemic steroids, scheduled bronchodilators, empiric doxycycline, supplemental oxygen, and was given 1 dose of furosemide.  Over the course of the evening he continued to have significant work of breathing.  Apparently was not agreeable to using BiPAP at one point, and slowly developed worsening increased work of breathing by the a.m. hours of 5/7.  Critical care was consulted the a.m. of 5/7 as patient developed rather acute respiratory distress.  This had slowly become more progressive.  Upon time of critical care arrival the patient was sitting up on the side of the bed.  Had marked accessory use, and pronounced upper airway wheezing.    CULTURES: Results for orders placed or performed during the hospital encounter of 12/14/17  MRSA PCR Screening     Status: None   Collection Time: 12/15/17  9:32 AM  Result Value Ref Range Status   MRSA by PCR NEGATIVE NEGATIVE Final    Comment:        The GeneXpert MRSA Assay (FDA approved for NASAL specimens only), is one component of a comprehensive MRSA colonization surveillance program. It is not intended to diagnose MRSA infection nor to guide or monitor treatment for MRSA infections. Performed at [facility], [place] [address], [place], [place] [address]   Pneumocystis smear by DFA     Status: None   Collection Time: 12/15/17  4:05 PM  Result Value Ref Range Status   Specimen Source-PJSRC BRONCHIAL ALVEOLAR LAVAGE  Final   Pneumocystis jiroveci Ag NEGATIVE  Final    Comment: Performed at [facility] Performed at [facility], [address], [place], [place] [address]   Culture, bal-quantitative     Status: Abnormal   Collection Time: 12/15/17  6:24 PM  Result Value Ref Range Status   Specimen Description BRONCHIAL ALVEOLAR LAVAGE   Final   Special Requests NONE  Final   Gram Stain   Final    ABUNDANT WBC PRESENT,BOTH PMN AND MONONUCLEAR FEW GRAM POSITIVE COCCI    Culture (A)  Final    60,000 COLONIES/mL Consistent with normal respiratory flora. Performed at [facility], [place] [address] [place], [place] [address]    Report Status 12/18/2017 FINAL  Final   ANTIBIOTICS: Doxy 5/6>>> ? Stop date Fluconazole 5/7>>> Clinda 5/7>>> 5/10 Anti-retrovirals 5/7>>>  SIGNIFICANT EVENTS: 5/9 - Agitated overnight and sedated, no event overnight  5/10 - On precedex gtt and fent gtt . -> on WUA and can follow commands on sedation. Gets agitated when sedation weaned. Not done SBT because PEEP 10. Not on pressors. Temp 100.48F - diaphoretic. Not on HD/CRRT. MAking urine.   12/19/17 - ID stopped zyvox, clinda, primaquin yesterday. AKI better today . On  Free water. Per RN - agittaed overnight. WUA pending this AM -. SEt off apnea alarms on fent gtt and precedex gtt.   SUBJECTIVE/OVERNIGHT/INTERVAL HX Agitated overnight, requiring precedex overnight.  VITAL SIGNS: BP 126/68   Pulse (!) 38   Temp (!) 97.2 F (36.2 C) (Axillary)   Resp 18   Ht _0  (1.753 m)   Wt 229 lb 8 oz (104.1 kg)   SpO2 94%   BMI 33.89 kg/m   HEMODYNAMICS:    VENTILATOR SETTINGS: Vent Mode: BIPAP FiO2 (%):  [50 %-60 %] 50 % Set Rate:  [12 bmp] 12 bmp PEEP:  [5 cmH20] 5 cmH20  INTAKE / OUTPUT: I/O last 3 completed shifts: In: 3570.7 [P.O.:666; I.V.:1914.7; NG/GT:790; IV Piggyback:200] Out: 8118 [Urine:6775]  PHYSICAL EXAMINATION:  General Appearance:    Awake, confused, interactive, agitated  Head:    Romeo/AT and MMM  Eyes:    PERRL and EOM-I  Ears:    Normal external ear canals, both ears     Throat:  Clear  Neck:   Supple with no thyromegally     Lungs:     CTA bilaterally   Chest wall:    WNL  Heart:    RRR, Nl S1/S2 and -M/R/G  Abdomen:     Soft, NT, ND and +BS        Extremities:   -edema and -tenderness     Skin:    Intact.     Neurologic:   Awake, agitated, moving all ext to command   PULMONARY Recent Labs  Lab 12/14/17 1550  12/15/17 1504 12/15/17 1855 12/17/17 0353 12/18/17 0415 12/18/17 0517  PHART  --    < > 7.117* 7.316* 7.350 7.345* 7.380  PCO2ART  --    < > 105* 58.5* 54.4* 56.8* 52.9*  PO2ART  --    < > 218* 71.0* 64.0* 45.0* 94.0  HCO3  --    < > 32.5* 29.9* 30.1* 30.8* 31.3*  TCO2 27  --   --  32 32 32 33*  O2SAT  --    < > 98.9 92.0 90.0 75.0 97.0   < > = values in this interval not displayed.   CBC Recent Labs  Lab 12/19/17 0248  12/20/17 0608 12/21/17 0411  HGB 11.1* 11.1* 11.8*  HCT 37.3* 39.4 40.4  WBC 9.5 13.9* 13.4*  PLT 232 230 200   COAGULATION No results for input(s): INR in the last 168 hours.  CARDIAC   Recent Labs  Lab 12/15/17 0012 12/15/17 0602 12/15/17 0953 12/19/17 0248  TROPONINI <0.03 <0.03 <0.03 <0.03   No results for input(s): PROBNP in the last 168 hours.  CHEMISTRY Recent Labs  Lab 12/16/17 1600 12/17/17 0312 12/18/17 0255 12/19/17 0054 12/19/17 0248 12/20/17 0608 12/21/17 0411  NA 141 144 145 151*  --   --  146*  K 5.2* 4.7 4.8 4.8  --   --  5.2*  CL 105 104 106 114*  --   --  107  CO2 _0 --   --  30  GLUCOSE 190* 171* 344* 330*  --   --  203*  BUN 55* 63* 85* 89*  --   --  72*  CREATININE 2.24* 1.89* 1.95* 1.74*  --   --  1.34*  CALCIUM 9.4 9.3 9.1 9.5  --   --  9.7  MG  --  2.6* 2.6*  --  2.7* 2.8* 2.4  PHOS  --  4.5 3.8  --  3.2 4.3 4.8*   Estimated Creatinine Clearance: 77.6 mL/min (A) (by C-G formula based on SCr of 1.34 mg/dL (H)).  LIVER Recent Labs  Lab 12/14/17 1526  AST 21  ALT 20  ALKPHOS 46  BILITOT 0.3  PROT 7.7  ALBUMIN 3.4*   INFECTIOUS Recent Labs  Lab 12/15/17 0953  PROCALCITON 0.10   ENDOCRINE CBG (last 3)  Recent Labs    12/20/17 2344 12/21/17 0423 12/21/17 0722  GLUCAP 196* 177* 181*   IMAGING x48h  - image(s) personally visualized  -   highlighted in bold Dg Chest Port  1 View  Result Date: 12/21/2017 CLINICAL DATA:  Shortness of breath EXAM: PORTABLE CHEST 1 VIEW COMPARISON:  12/20/2017 FINDINGS: Cardiac shadow remains enlarged. Endotracheal tube and nasogastric catheter have been removed in the interval. Mild right basilar atelectasis is noted. No pneumothorax is seen. No bony abnormality is noted. IMPRESSION: Mild right basilar atelectasis. Electronically Signed   By: Inez Catalina M.D.   On: 12/21/2017 07:08   Dg Chest Port 1 View  Result Date: 12/20/2017 CLINICAL DATA:  Endotracheal placement. EXAM: PORTABLE CHEST 1 VIEW COMPARISON:  12/18/2017 FINDINGS: Endotracheal tube tip is 4 cm above the carina. Nasogastric tube enters the stomach. Mild cardiomegaly as seen previously. The lungs are clear and well aerated. No edema. IMPRESSION: Endotracheal tube and nasogastric tube well positioned. Lungs clear. Electronically Signed   By: Nelson Chimes M.D.   On: 12/20/2017 07:49   DISCUSSION: 52 year old male patient with history of chronic respiratory failure in the setting of stage III chronic obstructive pulmonary disease and ongoing tobacco abuse.  Recently discharged after a prolonged hospitalization due to influenza pneumonia, resulting in ARDS, prolonged ventilator dependence, and ultimately tracheostomy as well as dialysis requirement.  Now readmitted with what appears to be acute exacerbation of chronic obstructive pulmonary disease further complicated by laryngopharyngeal reflux component as evidenced by loud upper airway wheeze.  We will place him on noninvasive ventilation, continue scheduled bronchodilators, continue systemic steroids, add PPI, also change him to IV fluconazole.  His CD4 count was remarkably lower than times in the past however his viral load was still less than 20.  We will repeat this.  He is at very high  risk for intubation.  If intubated he would benefit from bronchoscopy with bronchial alveolar lavage  ASSESSMENT / PLAN:  Acute on chronic  hypoxic and hypercarbic respiratory failure in the setting of acute exacerbation of chronic obstructive pulmonary disease and what appears to be significant component of laryngal pharyngeal reflux disease as evidenced by marked upper airway wheezing.  -oral candidiasis -Ongoing tobacco abuse Remains on HFNC and high dose steroids and desaturating OSA  Plan - Hold in the ICU for respiratory failure and agitation requiring precedex drip - Titrate O2 for sat of 88-92% - Monitor closely for airway protection - D/C solumedrol - Prednisone 40 mg PO daily with a taper over a week - Atrovent - Xopenex - Start Dulera - CPAP while asleep  History of chronic atrial fibrillation and chronic diastolic heart failure. History of hypertension Is actually currently in sinus rhythm  Plan - Continue telemetry monitoring - Rate control with dilt 240 daily - Continue eliquis  Acute on chronic renal failure with recent AKI dialysis Persistent hyperkalemia  Plan - Monitor closely - Await repeat bmet - Kayexalate 30 gm PO x1 - Replace electrolytes as indicated  Anemia of chronic disease without evidence of bleeding  Recent Labs  Lab 12/19/17 0248 12/20/17 0608 12/21/17 0411  HGB 11.1* 11.1* 11.8*  HCT 37.3* 39.4 40.4  WBC 9.5 13.9* 13.4*  PLT 232 230 200   Plan - CBC in AM - Transfuse per ICU protocol  History of HIV.  Had been adherent with his medical regimen, however his last CD4 count was 40, but his viral load was less than 20.  Plan - Abx per ID  Diabetes, poorly controlled with hyperglycemia Plan - Sliding scale insulin - Heart healthy carb modified diet  History of anxiety and depression  12/20/17 - has had agitated delirium since admit needing restrains , fent gtt and precedex. This AM better  Plan - D/C fentanyl drip - Precedex drip for agitation - Add gabapentin and elavil - Will add seroquel if unable to wean precedex down   FAMILY  - Updates: Patient  updated bedside  - Inter-disciplinary family meet or Palliative Care meeting due by: 5/17  The patient is critically ill with multiple organ systems failure and requires high complexity decision making for assessment and support, frequent evaluation and titration of therapies, application of advanced monitoring technologies and extensive interpretation of multiple databases.   Critical Care Time devoted to patient care services described in this note is  32  Minutes. This time reflects time of care of this signee Dr Jennet Maduro. This critical care time does not reflect procedure time, or teaching time or supervisory time of PA/NP/Med student/Med Resident etc but could involve care discussion time.  Rush Farmer, M.D. [facility] Pulmonary/Critical Care Medicine. Pager: [phone]. After hours pager: [phone].  12/21/2017 9:32 AM

## 2017-12-21 NOTE — Progress Notes (Signed)
Patient sitting in chair at this time. Not ready for BiPAP. RT will continue to monitor.

## 2017-12-21 NOTE — Evaluation (Signed)
Physical Therapy Evaluation Patient Details Name: Chris [name]. MRN: [id number] DOB: Jan 03, 1966 Today's Date: 12/21/2017   History of Present Illness  52 year old male patient with history of chronic respiratory failure in the setting of stage III chronic obstructive pulmonary disease and ongoing tobacco abuse.  Recently discharged after a prolonged hospitalization due to influenza pneumonia, resulting in ARDS, prolonged ventilator dependence, and ultimately tracheostomy as well as dialysis requirement.  Being treated for acute on chronic hypoxic and hypercarbic respiratory failure in the setting of acute exacerbation of chronic obstructive pulmonary disease and what appears to be significant component of laryngal pharyngeal reflux disease as evidenced by marked upper airway wheezing.   Clinical Impression  PTA pt had returned home from rehab from prior hospitalization in the spring and was ambulating limited community distances without AD, and able to perform ADLs. Pt is currently limited in his safe mobility by O2 desaturation (See general comments) and decreased strength and endurance. Pt currently requires modAx2 for sit<>stand with RW and is unable to come into fully upright position to steady himself. PT recommends SNF level rehab at d/c to gain strength and endurance to ultimately safely navigate his home environment. PT will continue to follow acutely until d/c.     Follow Up Recommendations SNF    Equipment Recommendations  None recommended by PT    Recommendations for Other Services OT consult     Precautions / Restrictions Restrictions Weight Bearing Restrictions: No      Mobility  Bed Mobility               General bed mobility comments: OOB in recliner  Transfers Overall transfer level: Needs assistance Equipment used: Rolling walker (2 wheeled) Transfers: Sit to/from Stand Sit to Stand: Mod assist;+2 physical assistance         General transfer comment:  modAx2 for power up into standing with RW, unable to attain fully upright in 2x attempt   Ambulation/Gait             General Gait Details: unable at this time     Balance Overall balance assessment: Needs assistance Sitting-balance support: No upper extremity supported;Feet supported Sitting balance-Leahy Scale: Fair     Standing balance support: Bilateral upper extremity supported Standing balance-Leahy Scale: Zero                               Pertinent Vitals/Pain Pain Assessment: 0-10 Pain Score: 6  Pain Location: chest Pain Descriptors / Indicators: Heaviness Pain Intervention(s): Limited activity within patient's tolerance;Monitored during session    Home Living Family/patient expects to be discharged to:: Private residence Living Arrangements: Spouse/significant other Available Help at Discharge: Family;Available PRN/intermittently Type of Home: House Home Access: Level entry     Home Layout: One level Home Equipment: Walker - 2 wheels;Bedside commode      Prior Function Level of Independence: Independent with assistive device(s)         Comments: pt had rehabbed at [facility] and was working with HHPT ambulating limited community distances without AD, independent with ADLs        Extremity/Trunk Assessment   Upper Extremity Assessment Upper Extremity Assessment: Generalized weakness    Lower Extremity Assessment Lower Extremity Assessment: Generalized weakness       Communication   Communication: No difficulties  Cognition Arousal/Alertness: Awake/alert Behavior During Therapy: WFL for tasks assessed/performed Overall Cognitive Status: Within Functional Limits for tasks assessed  General Comments General comments (skin integrity, edema, etc.): Pt on 15 L O2 via HFNC, sitting in recliner SaO2 93% O2, HR 122 bpm, BP 136/91, with activity SaO2 dropped to 83%O2 and required  vc for pursed lipped breathing before returning to 90% saO2, HR max with activity 132 bpm, BP after activity 133/ 81        Assessment/Plan    PT Assessment Patient needs continued PT services  PT Problem List Decreased strength;Decreased activity tolerance;Decreased balance;Decreased mobility;Cardiopulmonary status limiting activity;Pain       PT Treatment Interventions DME instruction;Gait training;Functional mobility training;Therapeutic activities;Therapeutic exercise;Balance training;Cognitive remediation;Patient/family education    PT Goals (Current goals can be found in the Care Plan section)  Acute Rehab PT Goals Patient Stated Goal: go home  PT Goal Formulation: With patient Time For Goal Achievement: 01/04/18 Potential to Achieve Goals: Fair    Frequency Min 3X/week    AM-PAC PT "6 Clicks" Daily Activity  Outcome Measure Difficulty turning over in bed (including adjusting bedclothes, sheets and blankets)?: A Little Difficulty moving from lying on back to sitting on the side of the bed? : Unable Difficulty sitting down on and standing up from a chair with arms (e.g., wheelchair, bedside commode, etc,.)?: Unable Help needed moving to and from a bed to chair (including a wheelchair)?: A Lot Help needed walking in hospital room?: A Lot Help needed climbing 3-5 steps with a railing? : Total 6 Click Score: 10    End of Session Equipment Utilized During Treatment: Gait belt;Oxygen Activity Tolerance: Treatment limited secondary to medical complications (Comment);Patient limited by fatigue(O2 desaturation ) Patient left: in chair;with call bell/phone within reach;with family/visitor present;with chair alarm set Nurse Communication: Mobility status PT Visit Diagnosis: Unsteadiness on feet (R26.81);Other abnormalities of gait and mobility (R26.89);Muscle weakness (generalized) (M62.81);Difficulty in walking, not elsewhere classified (R26.2)    Time: 1610-9604 PT Time  Calculation (min) (ACUTE ONLY): 25 min   Charges:   PT Evaluation $PT Eval Moderate Complexity: 1 Mod PT Treatments $Therapeutic Activity: 8-22 mins   PT G Codes:        Khanh Tanori B. Beverely Risen PT, DPT Acute Rehabilitation  [phone] Pager [phone]    Elon Alas Fleet 12/21/2017, 4:20 PM

## 2017-12-22 LAB — CBC WITH DIFFERENTIAL/PLATELET
Basophils Absolute: 0 10*3/uL (ref 0.0–0.1)
Basophils Relative: 0 %
EOS ABS: 0 10*3/uL (ref 0.0–0.7)
Eosinophils Relative: 0 %
HCT: 39.5 % (ref 39.0–52.0)
HEMOGLOBIN: 12 g/dL — AB (ref 13.0–17.0)
Lymphocytes Relative: 8 %
Lymphs Abs: 1.2 10*3/uL (ref 0.7–4.0)
MCH: 28.8 pg (ref 26.0–34.0)
MCHC: 30.4 g/dL (ref 30.0–36.0)
MCV: 95 fL (ref 78.0–100.0)
MONO ABS: 2.1 10*3/uL — AB (ref 0.1–1.0)
MONOS PCT: 15 %
NEUTROS PCT: 77 %
Neutro Abs: 10.8 10*3/uL — ABNORMAL HIGH (ref 1.7–7.7)
Platelets: 197 10*3/uL (ref 150–400)
RBC: 4.16 MIL/uL — ABNORMAL LOW (ref 4.22–5.81)
RDW: 13.8 % (ref 11.5–15.5)
WBC: 14.1 10*3/uL — ABNORMAL HIGH (ref 4.0–10.5)

## 2017-12-22 LAB — GLUCOSE, CAPILLARY
GLUCOSE-CAPILLARY: 198 mg/dL — AB (ref 65–99)
GLUCOSE-CAPILLARY: 252 mg/dL — AB (ref 65–99)
GLUCOSE-CAPILLARY: 338 mg/dL — AB (ref 65–99)
GLUCOSE-CAPILLARY: 361 mg/dL — AB (ref 65–99)
Glucose-Capillary: 126 mg/dL — ABNORMAL HIGH (ref 65–99)
Glucose-Capillary: 224 mg/dL — ABNORMAL HIGH (ref 65–99)

## 2017-12-22 LAB — BASIC METABOLIC PANEL
ANION GAP: 12 (ref 5–15)
BUN: 63 mg/dL — AB (ref 6–20)
CALCIUM: 9.4 mg/dL (ref 8.9–10.3)
CO2: 26 mmol/L (ref 22–32)
Chloride: 100 mmol/L — ABNORMAL LOW (ref 101–111)
Creatinine, Ser: 1.34 mg/dL — ABNORMAL HIGH (ref 0.61–1.24)
GFR calc Af Amer: >60 mL/min (ref >60)
GFR calc non Af Amer: 60 mL/min — ABNORMAL LOW (ref >60)
GLUCOSE: 225 mg/dL — AB (ref 65–99)
POTASSIUM: 4.1 mmol/L (ref 3.5–5.1)
Sodium: 138 mmol/L (ref 135–145)

## 2017-12-22 LAB — MAGNESIUM: MAGNESIUM: 2.1 mg/dL (ref 1.7–2.4)

## 2017-12-22 LAB — BRAIN NATRIURETIC PEPTIDE: B Natriuretic Peptide: 109 pg/mL — ABNORMAL HIGH (ref 0.0–100.0)

## 2017-12-22 LAB — PHOSPHORUS: Phosphorus: 3.2 mg/dL (ref 2.5–4.6)

## 2017-12-22 MED ORDER — FUROSEMIDE 10 MG/ML IJ SOLN
40.0000 mg | Freq: Once | INTRAMUSCULAR | Status: AC
  Administered 2017-12-22: 40 mg via INTRAVENOUS
  Filled 2017-12-22: qty 4

## 2017-12-22 MED ORDER — LEVALBUTEROL HCL 0.63 MG/3ML IN NEBU
0.6300 mg | INHALATION_SOLUTION | Freq: Three times a day (TID) | RESPIRATORY_TRACT | Status: DC
  Administered 2017-12-23: 0.63 mg via RESPIRATORY_TRACT
  Filled 2017-12-22: qty 3

## 2017-12-22 MED ORDER — IPRATROPIUM BROMIDE 0.02 % IN SOLN
0.5000 mg | Freq: Three times a day (TID) | RESPIRATORY_TRACT | Status: DC
  Administered 2017-12-23 – 2017-12-27 (×11): 0.5 mg via RESPIRATORY_TRACT
  Filled 2017-12-22 (×13): qty 2.5

## 2017-12-22 MED ORDER — HYDRALAZINE HCL 50 MG PO TABS
50.0000 mg | ORAL_TABLET | Freq: Three times a day (TID) | ORAL | Status: DC
  Administered 2017-12-22 – 2017-12-29 (×22): 50 mg via ORAL
  Filled 2017-12-22 (×24): qty 1

## 2017-12-22 MED ORDER — OXYCODONE HCL 5 MG PO TABS
5.0000 mg | ORAL_TABLET | Freq: Three times a day (TID) | ORAL | Status: DC | PRN
  Administered 2017-12-22 – 2017-12-28 (×13): 5 mg via ORAL
  Filled 2017-12-22 (×14): qty 1

## 2017-12-22 MED ORDER — NICOTINE 21 MG/24HR TD PT24
21.0000 mg | MEDICATED_PATCH | Freq: Every day | TRANSDERMAL | Status: DC
  Administered 2017-12-22 – 2017-12-29 (×8): 21 mg via TRANSDERMAL
  Filled 2017-12-22 (×9): qty 1

## 2017-12-22 MED ORDER — ISOSORBIDE MONONITRATE ER 30 MG PO TB24
30.0000 mg | ORAL_TABLET | Freq: Every day | ORAL | Status: DC
  Administered 2017-12-22 – 2017-12-29 (×8): 30 mg via ORAL
  Filled 2017-12-22 (×8): qty 1

## 2017-12-22 MED ORDER — INSULIN ASPART 100 UNIT/ML ~~LOC~~ SOLN
5.0000 [IU] | Freq: Once | SUBCUTANEOUS | Status: AC
  Administered 2017-12-22: 5 [IU] via SUBCUTANEOUS

## 2017-12-22 NOTE — Clinical Social Work Note (Signed)
Clinical Social Work Assessment  Patient Details  Name: Chris [name]. MRN: [id number] Date of Birth: [birth year]  Date of referral:  12/22/17               Reason for consult:  Facility Placement                Permission sought to share information with:  Family Supports Permission granted to share information::  Yes, Verbal Permission Granted  Name::     Health visitor::  family  Relationship::  spouse  Solicitor Information:  Patent attorney [phone]  Housing/Transportation Living arrangements for the past 2 months:  Single Family Home(with wife.) Source of Information:  Patient Patient Interpreter Needed:  None Criminal Activity/Legal Involvement Pertinent to Current Situation/Hospitalization:  No - Comment as needed Significant Relationships:  Spouse, Other Family Members Lives with:  Spouse Do you feel safe going back to the place where you live?  Yes Need for family participation in patient care:  Yes (Comment)  Care giving concerns: CSW spoke with pt at bedside. At this time pt denies having any concerns. Pt did express that pt has been having trouble walking and that is why pt had HH coming in the home to help with pt's ability to walk.    Social Worker assessment / plan:  CSW spoke with pt at bedside. During this time CSW was informed that pt is from home with wife. Pt expressed that pt has lived with wife for over 25 years and this is the primary supporter for pt. Pt reports that pt does have support from other family members but not many live in this area. Pt is understanding that pt's needs may be greater at the time of discharge however pt is wanting to go home but is also considering going to a facility to get pt back to walking.   During this assessment was sitting up in bed and was in a positive mood. Pt made eye contact with CSW and asked questions to better understand what comes after discharge for pt.   Employment status:  Other (Comment)(unknown at this  time. ) Insurance information:  Other (Comment Required)([employer] Medicare) PT Recommendations:  Skilled Nursing Facility, 24 Hour Supervision Information / Referral to community resources:  Skilled Nursing Facility(spoke with pt about placement at a Skilled Nursing Facility. )  Patient/Family's Response to care:  Pt's appeared to be understanding and agreeable to plan of care at this time.   Patient/Family's Understanding of and Emotional Response to Diagnosis, Current Treatment, and Prognosis:  No further questions or concerns have been presented to CSW at this time. Pt is understanding of care and emotional response was thankful that CSW and other staff are here to help pt with further needs.   Emotional Assessment Appearance:  Appears stated age Attitude/Demeanor/Rapport:  Engaged Affect (typically observed):  Appropriate, Pleasant Orientation:  Oriented to Self, Oriented to Place, Oriented to  Time, Oriented to Situation Alcohol / Substance use:  Not Applicable Psych involvement (Current and /or in the community):  No (Comment)  Discharge Needs  Concerns to be addressed:  No discharge needs identified Readmission within the last 30 days:  No Current discharge risk:  Dependent with Mobility Barriers to Discharge:  Continued Medical Work up   [employer], LCSWA 12/22/2017, 11:22 AM

## 2017-12-22 NOTE — Progress Notes (Signed)
_0 @        PROGRESS NOTE                                                                                                                                                                                                             Patient Demographics:    Chris [name], is a 52 y.o. male, DOB - [birth year], ONG:[id number]  Admit date - 12/14/2017   Admitting Physician Modena Jansky, MD  Outpatient Primary MD for the patient is [name], NP  LOS - 8  Chief Complaint  Patient presents with  . Respiratory Distress       Brief Narrative -   this is a 52 year old African-American morbidly obese gentleman with history of gold 3 COPD on as needed home oxygen at home, hypertension, chronic diastolic heart failure EF 60% on recent echocardiogram, HIV positive, DM type II, ongoing tobacco abuse counseled to quit, paroxysmal atrial fibrillation [place] vas 2 score of at least 3 on Eliquis, recent influenza induced ARDS requiring tracheostomy intubation and a few episodes of dialysis few weeks ago, who was now admitted to the hospital for acute on chronic hypoxic respiratory failure due to COPD exacerbation, he was in ICU requiring BiPAP under critical care service, he was stabilized placed on oral steroid taper and transferred to my service on 12/22/2017.   Subjective:    [facility] today has, No headache, No chest pain, No abdominal pain - No Nausea, No new weakness tingling or numbness, No Cough - +ve but better SOB.     Assessment  & Plan :    Principal Problem:   COPD exacerbation ([place]) Active Problems:   Obstructive sleep apnea syndrome   Essential hypertension, benign   Cigarette nicotine dependence without complication   GERD (gastroesophageal reflux disease)   Acute on chronic respiratory failure with hypoxia (HCC)   PAF (paroxysmal atrial fibrillation) (HCC)   Diabetes mellitus type 2 in obese (HCC)   HIV (human immunodeficiency virus infection) ([place])   1.  Acute on  chronic hypoxic respiratory failure in patient with advanced COPD who is on as needed home oxygen, also with some upper airway wheezing and now likely some fluid overload causing acute on chronic diastolic CHF with EF 44%.  He has been transferred from ICU service to [place] on 12/22/2017, he is on oral steroid taper which will be continued, will challenge him with a dose of IV Lasix, continue supportive care with nebulizer treatments, he still on high flow 15 L nasal cannula oxygen, will  try to titrate down oxygen before moving him out of stepdown.  Chest x-ray is unrevealing.  Continue to monitor closely.  2.  Acute on chronic diastolic CHF.  EF 60% on recent echocardiogram.  Lasix x1 today, continue Cardizem, blood pressure poorly controlled will add Imdur and hydralazine and monitor clinically.  3.  Paroxysmal atrial fibrillation [place] vas 2 score of at least 3 -on Cardizem and Eliquis continue.  Goal is rate control.  4.  DM type 2 -currently on long and short acting insulin, monitor CBGs QA CHS and adjust as needed.  CBG (last 3)  Recent Labs    12/21/17 2307 12/22/17 0349 12/22/17 0742  GLUCAP 318* 198* 126*    5.  Hypertension.  Poorly controlled.  On Cardizem have added M door along with hydralazine for better control.  6.  HIV.  No acute issues continue home regimen.  7.  Oral Candida infection.  On oral Diflucan, stable.  8.  Diabetic peripheral neuropathy.  Stable on Neurontin.  9.  Morbid obesity with obstructive sleep apnea.  CPAP nightly, follow with PCP for weight loss.     Diet : Heart Healthy   Family Communication  :  None  Code Status :  Full  Disposition Plan  :  Step down  Consults  :  PCCM  Procedures  :      DVT Prophylaxis  :  Eliquis  Lab Results  Component Value Date   PLT 197 12/22/2017    Inpatient Medications  Scheduled Meds: . amitriptyline  100 mg Oral QHS  . apixaban  5 mg Per Tube BID  . bictegravir-emtricitabine-tenofovir AF  1  tablet Oral Daily  . dapsone  100 mg Oral Daily  . diltiazem  240 mg Oral Daily  . docusate  100 mg Per Tube BID  . fluconazole  100 mg Oral Daily  . furosemide  40 mg Intravenous Once  . gabapentin  300 mg Oral TID  . insulin aspart  3-9 Units Subcutaneous Q4H  . insulin detemir  15 Units Subcutaneous Q12H  . ipratropium  0.5 mg Nebulization Q6H  . levalbuterol  0.63 mg Nebulization Q6H  . mouth rinse  15 mL Mouth Rinse BID  . mometasone-formoterol  2 puff Inhalation BID  . [START ON 12/25/2017] predniSONE  20 mg Oral Q breakfast  . predniSONE  40 mg Oral Q breakfast  . sodium chloride flush  3 mL Intravenous Q12H   Continuous Infusions: . sodium chloride Stopped (12/19/17 1443)  . dexmedetomidine Stopped (12/21/17 1059)   PRN Meds:.levalbuterol, midazolam, oxyCODONE  Antibiotics  :    Anti-infectives (From admission, onward)   Start     Dose/Rate Route Frequency Ordered Stop   12/21/17 1000  dapsone tablet 100 mg     100 mg Oral Daily 12/21/17 0919     12/21/17 1000  fluconazole (DIFLUCAN) tablet 100 mg  Status:  Discontinued     100 mg Oral Daily 12/21/17 0930 12/21/17 0931   12/21/17 1000  fluconazole (DIFLUCAN) tablet 100 mg     100 mg Oral Daily 12/21/17 0931 01/02/18 0959   12/21/17 0915  bictegravir-emtricitabine-tenofovir AF (BIKTARVY) 50-200-25 MG per tablet 1 tablet     1 tablet Oral Daily 12/21/17 0916     12/17/17 1200  linezolid (ZYVOX) IVPB 600 mg  Status:  Discontinued     600 mg 300 mL/hr over 60 Minutes Intravenous Every 12 hours 12/17/17 1034 12/18/17 1555   12/16/17 1000  tenofovir (VIREAD)  tablet 300 mg  Status:  Discontinued     300 mg Oral Daily 12/15/17 1613 12/21/17 0916   12/16/17 1000  emtricitabine (EMTRIVA) capsule 200 mg  Status:  Discontinued     200 mg Oral Daily 12/15/17 1613 12/21/17 0916   12/15/17 1800  clindamycin (CLEOCIN) IVPB 900 mg  Status:  Discontinued     900 mg 100 mL/hr over 30 Minutes Intravenous Every 8 hours 12/15/17 1624  12/18/17 1202   12/15/17 1700  dapsone tablet 100 mg  Status:  Discontinued     100 mg Oral Daily 12/15/17 1613 12/15/17 1624   12/15/17 1700  dolutegravir (TIVICAY) tablet 50 mg  Status:  Discontinued     50 mg Oral Daily 12/15/17 1613 12/21/17 0916   12/15/17 1630  primaquine tablet 30 mg  Status:  Discontinued     30 mg Per Tube Daily 12/15/17 1624 12/18/17 1202   12/15/17 1100  fluconazole (DIFLUCAN) IVPB 400 mg  Status:  Discontinued     400 mg 100 mL/hr over 120 Minutes Intravenous Every 24 hours 12/15/17 0923 12/21/17 0930   12/14/17 1900  doxycycline (VIBRA-TABS) tablet 100 mg  Status:  Discontinued     100 mg Oral Every 12 hours 12/14/17 1822 12/16/17 0851   12/14/17 1830  bictegravir-emtricitabine-tenofovir AF (BIKTARVY) 50-200-25 MG per tablet 1 tablet  Status:  Discontinued     1 tablet Oral Daily 12/14/17 1819 12/15/17 1613         Objective:   Vitals:   12/22/17 0700 12/22/17 0735 12/22/17 0740 12/22/17 0800  BP: 129/80   (!) 172/65  Pulse: 82   97  Resp: 14   19  Temp:      TempSrc:      SpO2: 94% 96% 95% 91%  Weight:      Height:        Wt Readings from Last 3 Encounters:  12/22/17 102.6 kg (226 lb 3.1 oz)  12/09/17 111 kg (244 lb 12.8 oz)  12/02/17 107.6 kg (237 lb 3.2 oz)     Intake/Output Summary (Last 24 hours) at 12/22/2017 0943 Last data filed at 12/22/2017 0700 Gross per 24 hour  Intake 671 ml  Output 3500 ml  Net -2829 ml     Physical Exam  Awake Alert, Oriented X 3, No new F.N deficits, Normal affect [place].AT,PERRAL Supple Neck,No JVD, No cervical lymphadenopathy appriciated.  Symmetrical Chest wall movement, Good air movement bilaterally, ++ basilar rales and exp whezes RRR,No Gallops,Rubs or new Murmurs, No Parasternal Heave +ve B.Sounds, Abd Soft, No tenderness, No organomegaly appriciated, No rebound - guarding or rigidity. No Cyanosis, Clubbing or edema, No new Rash or bruise       Data Review:    CBC Recent Labs  Lab  12/18/17 0255 12/19/17 0248 12/20/17 0608 12/21/17 0411 12/22/17 0448  WBC 9.6 9.5 13.9* 13.4* 14.1*  HGB 11.1* 11.1* 11.1* 11.8* 12.0*  HCT 36.9* 37.3* 39.4 40.4 39.5  PLT 237 232 230 200 197  MCV 96.1 98.2 102.3* 99.5 95.0  MCH 28.9 29.2 28.8 29.1 28.8  MCHC 30.1 29.8* 28.2* 29.2* 30.4  RDW 14.4 14.3 14.3 13.9 13.8  LYMPHSABS  --  0.9 1.1 1.1 1.2  MONOABS  --  1.3* 1.4* 1.1* 2.1*  EOSABS  --  0.0 0.0 0.0 0.0  BASOSABS  --  0.0 0.0 0.0 0.0    Chemistries  Recent Labs  Lab 12/17/17 0312 12/18/17 0255 12/19/17 0054 12/19/17 0248 12/20/17 6440 12/21/17 0411 12/22/17  0448  NA 144 145 151*  --   --  146* 138  K 4.7 4.8 4.8  --   --  5.2* 4.1  CL 104 106 114*  --   --  107 100*  CO2 _0 --   --  30 26  GLUCOSE 171* 344* 330*  --   --  203* 225*  BUN 63* 85* 89*  --   --  72* 63*  CREATININE 1.89* 1.95* 1.74*  --   --  1.34* 1.34*  CALCIUM 9.3 9.1 9.5  --   --  9.7 9.4  MG 2.6* 2.6*  --  2.7* 2.8* 2.4 2.1   ------------------------------------------------------------------------------------------------------------------ No results for input(s): CHOL, HDL, LDLCALC, TRIG, CHOLHDL, LDLDIRECT in the last 72 hours.  Lab Results  Component Value Date   HGBA1C 5.6 12/02/2017   ------------------------------------------------------------------------------------------------------------------ No results for input(s): TSH, T4TOTAL, T3FREE, THYROIDAB in the last 72 hours.  Invalid input(s): FREET3 ------------------------------------------------------------------------------------------------------------------ No results for input(s): VITAMINB12, FOLATE, FERRITIN, TIBC, IRON, RETICCTPCT in the last 72 hours.  Coagulation profile No results for input(s): INR, PROTIME in the last 168 hours.  No results for input(s): DDIMER in the last 72 hours.  Cardiac Enzymes Recent Labs  Lab 12/15/17 0953 12/19/17 0248  TROPONINI <0.03 <0.03    ------------------------------------------------------------------------------------------------------------------    Component Value Date/Time   BNP 73.6 12/14/2017 1440    Micro Results Recent Results (from the past 240 hour(s))  MRSA PCR Screening     Status: None   Collection Time: 12/15/17  9:32 AM  Result Value Ref Range Status   MRSA by PCR NEGATIVE NEGATIVE Final    Comment:        The GeneXpert MRSA Assay (FDA approved for NASAL specimens only), is one component of a comprehensive MRSA colonization surveillance program. It is not intended to diagnose MRSA infection nor to guide or monitor treatment for MRSA infections. Performed at [facility], [place] [address], [place], [place] [address]   Pneumocystis smear by DFA     Status: None   Collection Time: 12/15/17  4:05 PM  Result Value Ref Range Status   Specimen Source-PJSRC BRONCHIAL ALVEOLAR LAVAGE  Final   Pneumocystis jiroveci Ag NEGATIVE  Final    Comment: Performed at [facility] Performed at [facility], [address], [place], [place] [address]   Culture, bal-quantitative     Status: Abnormal   Collection Time: 12/15/17  6:24 PM  Result Value Ref Range Status   Specimen Description BRONCHIAL ALVEOLAR LAVAGE  Final   Special Requests NONE  Final   Gram Stain   Final    ABUNDANT WBC PRESENT,BOTH PMN AND MONONUCLEAR FEW GRAM POSITIVE COCCI    Culture (A)  Final    60,000 COLONIES/mL Consistent with normal respiratory flora. Performed at [facility], [place] [address], [place], [place] [address]    Report Status 12/18/2017 FINAL  Final    Radiology Reports Dg Chest Port 1 View  Result Date: 12/21/2017 CLINICAL DATA:  Shortness of breath EXAM: PORTABLE CHEST 1 VIEW COMPARISON:  12/20/2017 FINDINGS: Cardiac shadow remains enlarged. Endotracheal tube and nasogastric catheter have been removed in the interval. Mild right basilar atelectasis is noted. No pneumothorax is seen. No  bony abnormality is noted. IMPRESSION: Mild right basilar atelectasis. Electronically Signed   By: Inez Catalina M.D.   On: 12/21/2017 07:08   Dg Chest Port 1 View  Result Date: 12/20/2017 CLINICAL DATA:  Endotracheal placement. EXAM: PORTABLE CHEST 1 VIEW COMPARISON:  12/18/2017 FINDINGS: Endotracheal tube tip is 4 cm above the carina. Nasogastric tube enters the stomach. Mild cardiomegaly as seen previously. The lungs are clear and well aerated. No edema. IMPRESSION: Endotracheal tube and nasogastric tube well positioned. Lungs clear. Electronically Signed   By: Nelson Chimes M.D.   On: 12/20/2017 07:49   Dg Chest Port 1 View  Result Date: 12/18/2017 CLINICAL DATA:  Endotracheally intubated. EXAM: PORTABLE CHEST 1 VIEW COMPARISON:  12/17/2017 FINDINGS: Endotracheal tube terminates midway between the clavicles and carina, unchanged. Enteric tube courses into the left upper abdomen with tip not imaged. The cardiomediastinal silhouette is unchanged. Mild bibasilar opacities has not significantly changed and are at least partially chronic based on radiographs from prior years. No overt edema, sizable pleural effusion, or pneumothorax is identified. IMPRESSION: 1. Unchanged and satisfactory position of endotracheal tube. 2. Unchanged mild bibasilar opacities which may reflect atelectasis and mild chronic lung changes. Electronically Signed   By: Logan Bores M.D.   On: 12/18/2017 11:31   Dg Chest Port 1 View  Result Date: 12/17/2017 CLINICAL DATA:  Check endotracheal tube placement EXAM: PORTABLE CHEST 1 VIEW COMPARISON:  12/16/2017 FINDINGS: Endotracheal tube and nasogastric catheter are again noted and stable. The lungs are well aerated bilaterally. The previously seen basilar changes have improved in the interval. Cardiac shadow is stable. No bony abnormality is noted. IMPRESSION: Improving aeration in the bases bilaterally. Electronically Signed   By: Inez Catalina M.D.   On: 12/17/2017 10:14   Dg Chest  Port 1 View  Result Date: 12/16/2017 CLINICAL DATA:  Endotracheal placement. EXAM: PORTABLE CHEST 1 VIEW COMPARISON:  12/15/2017 FINDINGS: Endotracheal tube tip is 5 cm above the carina. There is infiltrate in both lower lobes, right more than left, consistent with basilar pneumonia. Upper lungs are clear. No evidence of heart failure or effusion. IMPRESSION: Endotracheal tube tip 5 cm above the carina. Bilateral lower lobe infiltrate right more than left. Electronically Signed   By: Nelson Chimes M.D.   On: 12/16/2017 10:28   Dg Chest Port 1 View  Result Date: 12/15/2017 CLINICAL DATA:  Endotracheal intubation EXAM: PORTABLE CHEST 1 VIEW COMPARISON:  Chest radiograph 12/15/2017 at 9:23 a.m. FINDINGS: Endotracheal tube tip is below the level of the clavicular heads approximately 3.2 cm above the carina. Orogastric tube courses below the diaphragm. Mediastinal silhouette and lungs are unchanged. Persistent bibasilar atelectasis. IMPRESSION: Endotracheal tube tip approximately 3.2 cm above the carina. Electronically Signed   By: Ulyses Jarred M.D.   On: 12/15/2017 15:17   Dg Chest Port 1 View  Result Date: 12/15/2017 CLINICAL DATA:  Onset of shortness of breath and acute respiratory distress today. History of COPD, CHF, HIV, current smoker. EXAM: PORTABLE CHEST 1 VIEW COMPARISON:  Chest x-ray of Dec 14, 2017 FINDINGS: The lungs remain mildly hyperinflated. The interstitial markings are coarse especially at the bases but have improved slightly since yesterday's study. The cardiac silhouette is enlarged. The central pulmonary vascularity is prominent. There is no pleural effusion. IMPRESSION: Slight interval improvement in the appearance of bibasilar atelectasis or pneumonia. Mild cardiomegaly with central pulmonary vascular congestion. Electronically Signed   By: David  Martinique M.D.   On: 12/15/2017 11:15   Dg Chest Port 1 View  Result Date: 12/14/2017 CLINICAL DATA:  SOB. Hx COPD with chronic home o2 use. Hx  CHF, DM, HTN. EXAM: PORTABLE CHEST 1 VIEW COMPARISON:  10/20/2017 FINDINGS: Cardiac silhouette is mildly enlarged.  No  mediastinal or masses. There is prominence of the bronchovascular markings lung base interstitial thickening, similar to the prior exam. No convincing pneumonia or pulmonary edema. No pleural effusion or pneumothorax. A possible right internal jugular central venous line not well-defined on this image. The possible tip projects in the region of the upper superior vena cava. Skeletal structures are grossly intact. IMPRESSION: No acute cardiopulmonary disease. Electronically Signed   By: Lajean Manes M.D.   On: 12/14/2017 15:58   Dg Abd Portable 1v  Result Date: 12/16/2017 CLINICAL DATA:  Orogastric placement. EXAM: PORTABLE ABDOMEN - 1 VIEW COMPARISON:  10/27/2017 FINDINGS: Orogastric tube enters the stomach in has its tip in the antral region. Abdominal gas pattern is unremarkable. IMPRESSION: Orogastric tube tip in the gastric antrum region. Electronically Signed   By: Nelson Chimes M.D.   On: 12/16/2017 10:28    Time Spent in minutes  30   Lala Lund M.D on 12/22/2017 at 9:43 AM  Between 7am to 7pm - Pager - [phone] ( page via [email or web address], text pages only, please mention full 10 digit call back number). After 7pm go to [email or web address] - password [email or web address]

## 2017-12-22 NOTE — Progress Notes (Signed)
eLink Physician-Brief Progress Note Patient Name: Chris [name]. DOB: [birth year] MRN: [id number]   Date of Service  12/22/2017  HPI/Events of Note  Patient c/o back pain and takes Oxycodone IR 5 mg PO Q 8 hours PRN pain at home.   eICU Interventions  Will order Oxycodone IR 5 mg PO Q 8 hours PRN pain.      Intervention Category Intermediate Interventions: Pain - evaluation and management  [name] 12/22/2017, 12:54 AM

## 2017-12-22 NOTE — Progress Notes (Signed)
Nutrition Follow-up  DOCUMENTATION CODES:   Obesity unspecified  INTERVENTION:    Continue heart healthy CHO modified diet; current intake meets nutrition needs  NUTRITION DIAGNOSIS:   Inadequate oral intake related to inability to eat as evidenced by NPO status.  Resolved  GOAL:   Patient will meet greater than or equal to 90% of their needs  Met with PO intake of meals.  MONITOR:   PO intake  ASSESSMENT:   52 yo male with PMH of COPD (Gold 3), HIV, DM, depression, GERD, A fib, emphysema, HTN, and CHF who was admitted on 5/6 with acute on chronic respiratory failure. Required intubation 5/7.  Discussed patient in ICU rounds and with RN today. Extubated 5/12. Diet has been advanced to solid foods and patient is eating very well, consuming 100% of meals. No supplements needed at this time. Labs and medications reviewed.   Diet Order:   Diet Order           Diet heart healthy/carb modified Room service appropriate? Yes; Fluid consistency: Thin  Diet effective now          EDUCATION NEEDS:   No education needs have been identified at this time  Skin:  Skin Assessment: Skin Integrity Issues: Skin Integrity Issues:: Other (Comment) Other: anus medial warts  Last BM:  5/9  Height:   Ht Readings from Last 1 Encounters:  12/15/17 5' 9" (1.753 m)    Weight:   Wt Readings from Last 1 Encounters:  12/22/17 226 lb 3.1 oz (102.6 kg)    Ideal Body Weight:  72.7 kg  BMI:  Body mass index is 33.4 kg/m.  Estimated Nutritional Needs:   Kcal:  1800-2000  Protein:  105-120 gm  Fluid:  2 L    Molli Barrows, RD, LDN, CNSC Pager [phone] After Hours Pager [phone]

## 2017-12-23 LAB — GLUCOSE, CAPILLARY
GLUCOSE-CAPILLARY: 173 mg/dL — AB (ref 65–99)
GLUCOSE-CAPILLARY: 309 mg/dL — AB (ref 65–99)
GLUCOSE-CAPILLARY: 240 mg/dL — AB (ref 65–99)
Glucose-Capillary: 137 mg/dL — ABNORMAL HIGH (ref 65–99)
Glucose-Capillary: 139 mg/dL — ABNORMAL HIGH (ref 65–99)
Glucose-Capillary: 223 mg/dL — ABNORMAL HIGH (ref 65–99)
Glucose-Capillary: 251 mg/dL — ABNORMAL HIGH (ref 65–99)

## 2017-12-23 LAB — BASIC METABOLIC PANEL
ANION GAP: 10 (ref 5–15)
BUN: 47 mg/dL — AB (ref 6–20)
CALCIUM: 9.1 mg/dL (ref 8.9–10.3)
CO2: 30 mmol/L (ref 22–32)
CREATININE: 1.57 mg/dL — AB (ref 0.61–1.24)
Chloride: 98 mmol/L — ABNORMAL LOW (ref 101–111)
GFR calc Af Amer: 57 mL/min — ABNORMAL LOW (ref >60)
GFR calc non Af Amer: 49 mL/min — ABNORMAL LOW (ref >60)
Glucose, Bld: 190 mg/dL — ABNORMAL HIGH (ref 65–99)
Potassium: 3.5 mmol/L (ref 3.5–5.1)
Sodium: 138 mmol/L (ref 135–145)

## 2017-12-23 LAB — MAGNESIUM: MAGNESIUM: 2.1 mg/dL (ref 1.7–2.4)

## 2017-12-23 MED ORDER — INSULIN ASPART 100 UNIT/ML ~~LOC~~ SOLN
0.0000 [IU] | Freq: Every day | SUBCUTANEOUS | Status: DC
  Administered 2017-12-23: 2 [IU] via SUBCUTANEOUS
  Administered 2017-12-25: 3 [IU] via SUBCUTANEOUS
  Administered 2017-12-26: 5 [IU] via SUBCUTANEOUS
  Administered 2017-12-27: 3 [IU] via SUBCUTANEOUS
  Administered 2017-12-28: 2 [IU] via SUBCUTANEOUS

## 2017-12-23 MED ORDER — LEVALBUTEROL HCL 0.63 MG/3ML IN NEBU
0.6300 mg | INHALATION_SOLUTION | Freq: Four times a day (QID) | RESPIRATORY_TRACT | Status: DC
  Administered 2017-12-23 – 2017-12-27 (×14): 0.63 mg via RESPIRATORY_TRACT
  Filled 2017-12-23 (×16): qty 3

## 2017-12-23 MED ORDER — INSULIN ASPART 100 UNIT/ML ~~LOC~~ SOLN
0.0000 [IU] | Freq: Three times a day (TID) | SUBCUTANEOUS | Status: DC
  Administered 2017-12-24: 2 [IU] via SUBCUTANEOUS
  Administered 2017-12-24: 5 [IU] via SUBCUTANEOUS
  Administered 2017-12-24: 11 [IU] via SUBCUTANEOUS
  Administered 2017-12-25 (×2): 2 [IU] via SUBCUTANEOUS
  Administered 2017-12-25: 15 [IU] via SUBCUTANEOUS
  Administered 2017-12-26: 5 [IU] via SUBCUTANEOUS
  Administered 2017-12-26: 8 [IU] via SUBCUTANEOUS
  Administered 2017-12-26: 11 [IU] via SUBCUTANEOUS
  Administered 2017-12-27 (×2): 5 [IU] via SUBCUTANEOUS
  Administered 2017-12-27: 8 [IU] via SUBCUTANEOUS
  Administered 2017-12-28 (×2): 5 [IU] via SUBCUTANEOUS
  Administered 2017-12-28: 8 [IU] via SUBCUTANEOUS
  Administered 2017-12-29: 5 [IU] via SUBCUTANEOUS
  Administered 2017-12-29: 3 [IU] via SUBCUTANEOUS

## 2017-12-23 NOTE — Progress Notes (Signed)
Physical Therapy Treatment Patient Details Name: Chris [name] Jr. MRN: [id number] 1965/11/14 Today's Date: 12/23/2017    History of Present Illness 52 year old male patient with history of chronic respiratory failure in the setting of stage III chronic obstructive pulmonary disease and ongoing tobacco abuse.  Recently discharged after a prolonged hospitalization due to influenza pneumonia, resulting in ARDS, prolonged ventilator dependence, and ultimately tracheostomy as well as dialysis requirement.  Being treated for acute on chronic hypoxic and hypercarbic respiratory failure in the setting of acute exacerbation of chronic obstructive pulmonary disease and what appears to be significant component of laryngal pharyngeal reflux disease as evidenced by marked upper airway wheezing.     PT Comments    Pt has decreased strength and coordination in conjunction with decreased safety awareness and increased impulsiveness. Pt minA for bed mobility requiring vc for sitting EoB before attempting to stand. Pt with inability to keep L hand on RW and exhibited body shaking. With sit>stand to RW pt requires maxAx2, attempted to pivot to chair however pt unable to initiate turn. Mod Ax2 for sit>stand to [facility] for transfer to chair. D/c plans continue to remain appropriate. PT will continue to follow acutely.     Follow Up Recommendations  SNF     Equipment Recommendations  None recommended by PT    Recommendations for Other Services OT consult     Precautions / Restrictions Precautions Precautions: Fall Restrictions Weight Bearing Restrictions: No    Mobility  Bed Mobility Overal bed mobility: Needs Assistance Bed Mobility: Supine to Sit     Supine to sit: Min assist     General bed mobility comments: minA for bring trunk to upright  Transfers Overall transfer level: Needs assistance Equipment used: Rolling walker (2 wheeled) Transfers: Sit to/from Stand Sit to Stand: +2 physical  assistance;Max assist         General transfer comment: maxAx2 for sit to stand to walker, unable to take steps and sat down quickly, attempted to come to standing for pivot transfer to chair and again sat quickly without notice, utilized [employer] to transfer to chair, pt required modA for power up to [employer]   Ambulation/Gait             General Gait Details: unable at this time       Balance Overall balance assessment: Needs assistance Sitting-balance support: No upper extremity supported;Feet supported Sitting balance-Leahy Scale: Fair     Standing balance support: Bilateral upper extremity supported Standing balance-Leahy Scale: Zero                              Cognition Arousal/Alertness: Awake/alert Behavior During Therapy: Impulsive;Restless Overall Cognitive Status: Within Functional Limits for tasks assessed                                 General Comments: pt much more anxious and impulsive with therapy today needed to be told multiple times to slow down for safety      Exercises      General Comments General comments (skin integrity, edema, etc.): Pt on 6L O2 via HFNC and able to maintain 90% SaO2 throughout session       Pertinent Vitals/Pain Pain Assessment: No/denies pain           PT Goals (current goals can now be found in the care plan section) Acute Rehab PT  Goals Patient Stated Goal: go home  PT Goal Formulation: With patient Time For Goal Achievement: 01/04/18 Potential to Achieve Goals: Fair Progress towards PT goals: Not progressing toward goals - comment(pt has had decrease in coordination limiting mobility)    Frequency    Min 3X/week      PT Plan Current plan remains appropriate    Co-evaluation              AM-PAC PT "6 Clicks" Daily Activity  Outcome Measure  Difficulty turning over in bed (including adjusting bedclothes, sheets and blankets)?: A Little Difficulty moving from lying on  back to sitting on the side of the bed? : Unable Difficulty sitting down on and standing up from a chair with arms (e.g., wheelchair, bedside commode, etc,.)?: Unable Help needed moving to and from a bed to chair (including a wheelchair)?: A Lot Help needed walking in hospital room?: A Lot Help needed climbing 3-5 steps with a railing? : Total 6 Click Score: 10    End of Session Equipment Utilized During Treatment: Gait belt;Oxygen Activity Tolerance: Treatment limited secondary to medical complications (Comment);Patient limited by fatigue(O2 desaturation ) Patient left: in chair;with call bell/phone within reach;with family/visitor present;with chair alarm set Nurse Communication: Mobility status PT Visit Diagnosis: Unsteadiness on feet (R26.81);Other abnormalities of gait and mobility (R26.89);Muscle weakness (generalized) (M62.81);Difficulty in walking, not elsewhere classified (R26.2)     Time: 1610-9604 PT Time Calculation (min) (ACUTE ONLY): 29 min  Charges:  $Therapeutic Activity: 23-37 mins                    G Codes:       Leelah Hanna B. Beverely Risen PT, DPT Acute Rehabilitation  [phone] Pager [phone]     Elon Alas [facility] 12/23/2017, 4:38 PM

## 2017-12-23 NOTE — Clinical Social Work Note (Signed)
Clinical Social Worker met with patient at bedside to provide bed offers.  Patient expressed interest in [facility].  CSW contacted facility admissions coordinator who is willing to offer a bed and will initiate insurance authorization.  CSW updated RN who will notify patient.  CSW remains available for support and to facilitate patient discharge needs.  Barbette Or, [place]

## 2017-12-23 NOTE — NC FL2 (Signed)
[place] MEDICAID FL2 LEVEL OF CARE SCREENING TOOL     IDENTIFICATION  Patient Name: Chris [name]. Birthdate: 11/07/1965 Sex: male Admission Date (Current Location): 12/14/2017  [facility] and [place] Number:  Producer, television/film/video and Address:  [facility], [address] [place], [place] [address]      Provider Number: [id number]  Attending Physician Name and Address:  Dimple Nanas, MD  Relative Name and Phone Number:       Current Level of Care: Hospital Recommended Level of Care: Skilled Nursing Facility Prior Approval Number:    Date Approved/Denied:   PASRR Number: [id number]  Discharge Plan: SNF    Current Diagnoses: Patient Active Problem List   Diagnosis Date Noted  . Esophageal candidiasis (HCC)   . Dysphasia   . Anemia   . Evaluation by psychiatric service required   . Goals of care, counseling/discussion   . Elevated troponin   . AKI (acute kidney injury) (HCC)   . Endotracheally intubated   . Diabetes mellitus type 2 in obese (HCC) 10/01/2017  . Atrial fibrillation (HCC) 10/01/2017  . HIV (human immunodeficiency virus infection) (HCC) 10/01/2017  . Chronic diastolic heart failure (HCC)- exacerbated by Afib. 09/25/2017  . PAF (paroxysmal atrial fibrillation) (HCC) 09/25/2017  . Chest pain with moderate risk for cardiac etiology 09/25/2017  . Depression 07/21/2017  . GERD (gastroesophageal reflux disease) 07/21/2017  . Acute on chronic respiratory failure with hypoxia (HCC) 07/21/2017  . Dental caries 06/01/2017  . Special screening for malignant neoplasms, colon   . Dysphagia   . Candida esophagitis (HCC)   . Candidiasis of mouth 03/27/2016  . Adjustment disorder with depressed mood 01/04/2016  . Callus of foot 01/04/2016  . Hoarseness of voice 10/04/2015  . Cigarette nicotine dependence without complication 10/04/2015  . Primary osteoarthritis of right knee 03/26/2015  . Genital warts 01/22/2015  . Other male  erectile dysfunction 01/22/2015  . Essential hypertension, benign 12/04/2014  . Atopic eczema 12/04/2014  . COPD exacerbation (HCC) 08/23/2013  . Cyst (solitary) of breast 08/23/2013  . Obstructive sleep apnea syndrome 08/23/2013  . Steroid-induced hyperglycemia 01/12/2013  . Chronic respiratory failure with hypoxia and hypercapnia (HCC) 01/12/2013  . COPD  GOLD III, still smoking 01/11/2013  . HIV disease (HCC) 12/06/2012    Orientation RESPIRATION BLADDER Height & Weight     Self, Time, Situation, Place  O2(see DC summary) Incontinent, External catheter Weight: 239 lb 10.2 oz (108.7 kg) Height:   (175.3 cm)  BEHAVIORAL SYMPTOMS/MOOD NEUROLOGICAL BOWEL NUTRITION STATUS      Continent Diet(carb modified; cardiac)  AMBULATORY STATUS COMMUNICATION OF NEEDS Skin   Extensive Assist Verbally Normal                       Personal Care Assistance Level of Assistance  Bathing, Dressing Bathing Assistance: Maximum assistance   Dressing Assistance: Maximum assistance     Functional Limitations Info             SPECIAL CARE FACTORS FREQUENCY  PT (By licensed PT), OT (By licensed OT)     PT Frequency: 5/wk OT Frequency: 5/wk            Contractures      Additional Factors Info  Code Status, Allergies, Insulin Sliding Scale Code Status Info: FULL Allergies Info: Bactrim Sulfamethoxazole-trimethoprim   Insulin Sliding Scale Info: 8/day       Current Medications (12/23/2017):  This is the current  hospital active medication list Current Facility-Administered Medications  Medication Dose Route Frequency Provider Last Rate Last Dose  . 0.9 %  sodium chloride infusion   Intravenous Continuous Alyson Reedy, MD   Stopped at 12/19/17 1443  . amitriptyline (ELAVIL) tablet 100 mg  100 mg Oral QHS Kalman Shan, MD   100 mg at 12/22/17 2309  . apixaban (ELIQUIS) tablet 5 mg  5 mg Per Tube BID Simonne Martinet, NP   5 mg at 12/22/17 2309  .  bictegravir-emtricitabine-tenofovir AF (BIKTARVY) 50-200-25 MG per tablet 1 tablet  1 tablet Oral Daily Blanchard Kelch, NP   1 tablet at 12/22/17 1007  . dapsone tablet 100 mg  100 mg Oral Daily Blanchard Kelch, NP   100 mg at 12/22/17 1007  . diltiazem (CARDIZEM CD) 24 hr capsule 240 mg  240 mg Oral Daily Alyson Reedy, MD   240 mg at 12/22/17 1007  . docusate (COLACE) 50 MG/5ML liquid 100 mg  100 mg Per Tube BID Alyson Reedy, MD   100 mg at 12/22/17 2309  . fluconazole (DIFLUCAN) tablet 100 mg  100 mg Oral Daily Ginnie Smart, MD   100 mg at 12/22/17 1007  . gabapentin (NEURONTIN) capsule 300 mg  300 mg Oral TID Alyson Reedy, MD   300 mg at 12/22/17 2309  . hydrALAZINE (APRESOLINE) tablet 50 mg  50 mg Oral Q8H Leroy Sea, MD   50 mg at 12/23/17 0505  . insulin aspart (novoLOG) injection 3-9 Units  3-9 Units Subcutaneous Q4H Kalman Shan, MD   3 Units at 12/23/17 0505  . insulin detemir (LEVEMIR) injection 15 Units  15 Units Subcutaneous Q12H Kalman Shan, MD   15 Units at 12/22/17 2309  . ipratropium (ATROVENT) nebulizer solution 0.5 mg  0.5 mg Nebulization TID Elease Etienne, MD   0.5 mg at 12/23/17 0834  . isosorbide mononitrate (IMDUR) 24 hr tablet 30 mg  30 mg Oral Daily Leroy Sea, MD   30 mg at 12/22/17 1035  . levalbuterol (XOPENEX) nebulizer solution 0.63 mg  0.63 mg Nebulization Q6H PRN Simonne Martinet, NP      . levalbuterol Pauline Aus) nebulizer solution 0.63 mg  0.63 mg Nebulization TID Elease Etienne, MD   0.63 mg at 12/23/17 0834  . MEDLINE mouth rinse  15 mL Mouth Rinse BID Alyson Reedy, MD   15 mL at 12/22/17 1012  . midazolam (VERSED) injection 1-2 mg  1-2 mg Intravenous Q2H PRN Deterding, Dorise Hiss, MD   2 mg at 12/20/17 0322  . mometasone-formoterol (DULERA) 200-5 MCG/ACT inhaler 2 puff  2 puff Inhalation BID Alyson Reedy, MD   2 puff at 12/23/17 [phone]  . nicotine (NICODERM CQ - dosed in mg/24 hours) patch 21 mg  21 mg  Transdermal Daily Schorr, Roma Kayser, NP   21 mg at 12/22/17 2316  . oxyCODONE (Oxy IR/ROXICODONE) immediate release tablet 5 mg  5 mg Oral Q8H PRN Karl Ito, MD   5 mg at 12/23/17 0042  . [START ON 12/25/2017] predniSONE (DELTASONE) tablet 20 mg  20 mg Oral Q breakfast Alyson Reedy, MD      . predniSONE (DELTASONE) tablet 40 mg  40 mg Oral Q breakfast Alyson Reedy, MD   40 mg at 12/22/17 0756  . sodium chloride flush (NS) 0.9 % injection 3 mL  3 mL Intravenous Q12H Simonne Martinet, NP   3 mL at 12/22/17  2318     Discharge Medications: Please see discharge summary for a list of discharge medications.  Relevant Imaging Results:  Relevant Lab Results:   Additional Information SSN- [id number]  Burna Sis, LCSW

## 2017-12-23 NOTE — Care Management Important Message (Signed)
Important Message  Patient Details  Name: Chris [name]. MRN: [id number] Date of Birth: 1966-06-12   Medicare Important Message Given:  Yes    Edmund Holcomb 12/23/2017, 12:48 PM

## 2017-12-23 NOTE — Progress Notes (Signed)
PROGRESS NOTE    Chris [name].  UUV:[id number] DOB: 1966/02/23 DOA: 12/14/2017 PCP: [name], NP   Brief Narrative:  52 year old African-American male with history of COPD requiring as needed home oxygen, essential hypertension, chronic diastolic CHF ejection fraction 60%, positive, diabetes mellitus type 2, ongoing tobacco use, paroxysmal atrial fibrillation on Eliquis, recent influenza induced ARDS requiring tracheostomy intubation and few episodes of dialysis was admitted to the hospital for acute on chronic hypoxic respiratory failure due to COPD exacerbation.  He was initially taken to the ICU as he required BiPAP.  Over the course of several days his oxygen was weaned off and transition to medical service.   Assessment & Plan:   Principal Problem:   COPD exacerbation ([place]) Active Problems:   Obstructive sleep apnea syndrome   Essential hypertension, benign   Cigarette nicotine dependence without complication   GERD (gastroesophageal reflux disease)   Acute on chronic respiratory failure with hypoxia (HCC)   PAF (paroxysmal atrial fibrillation) (HCC)   Diabetes mellitus type 2 in obese (HCC)   HIV (human immunodeficiency virus infection) ([place])   Acute on chronic hypoxic respiratory failure Acute moderate to severe exacerbation- advanced persistent COPD - Currently patient is on tapered dose of prednisone orally -Continue Xopenex every 6 hours scheduled and every 6 hours as needed, Dulera twice daily, Atrovent 3 times daily -Wean off oxygen as his breathing symptoms improved  History of atrial fibrillation -Continue Eliquis 5 mg twice daily, Cardizem 240 mg daily  Chronic diastolic CHF, ejection fraction 60% -Patient really appears to be well compensated and in euvolemic state.  Continue current medications  Diabetes mellitus type 2 Peripheral neuropathy -Gabapentin 3 times daily 300 mg -Continue home diabetic medications  Essential hypertension - Patient is  on Cardizem, Imdur and hydralazine has been added here  Oral Candida -On oral Diflucan  Morbid obesity with obstructive sleep apnea -Use CPAP at bedtime.  Follow-up outpatient with primary care physician  PT rec- SNF.   DVT prophylaxis: Eliquis Code Status: Full code Family Communication:   None at bedside Disposition Plan: Maintain inpatient stay  Consultants:   None  Procedures:   None  Antimicrobials:   On Diflucan until Jan 02, 2018   Subjective: No new complaints this morning.  Overall feels weak.  Review of Systems Otherwise negative except as per HPI, including: General: Denies fever, chills, night sweats or unintended weight loss. Resp: Denies cough, wheezing Cardiac: Denies chest pain, palpitations, orthopnea, paroxysmal nocturnal dyspnea. GI: Denies abdominal pain, nausea, vomiting, diarrhea or constipation GU: Denies dysuria, frequency, hesitancy or incontinence MS: Denies muscle aches, joint pain or swelling Neuro: Denies headache, neurologic deficits (focal weakness, numbness, tingling), abnormal gait Psych: Denies anxiety, depression, SI/HI/AVH Skin: Denies new rashes or lesions ID: Denies sick contacts, exotic exposures, travel  Objective: Vitals:   12/22/17 2343 12/23/17 0427 12/23/17 0736 12/23/17 0836  BP: (!) 142/85 118/75 105/61   Pulse: 97 86 84   Resp: (!) 117 18 18   Temp: 97.6 F (36.4 C) 98 F (36.7 C) 97.9 F (36.6 C)   TempSrc: Oral Oral Oral   SpO2: 98% 94% 97% 94%  Weight:  108.7 kg (239 lb 10.2 oz)    Height:        Intake/Output Summary (Last 24 hours) at 12/23/2017 1101 Last data filed at 12/23/2017 0510 Gross per 24 hour  Intake 222 ml  Output 1885 ml  Net -1663 ml   Filed Weights   12/21/17 0500 12/22/17 0423  12/23/17 0427  Weight: 104.1 kg (229 lb 8 oz) 102.6 kg (226 lb 3.1 oz) 108.7 kg (239 lb 10.2 oz)    Examination:  General exam: Appears calm and comfortable  Respiratory system: Slightly diffuse diminished  breath sounds Cardiovascular system: S1 & S2 heard, RRR. No JVD, murmurs, rubs, gallops or clicks. No pedal edema. Gastrointestinal system: Abdomen is nondistended, soft and nontender. No organomegaly or masses felt. Normal bowel sounds heard. Central nervous system: Alert and oriented. No focal neurological deficits. Extremities: Symmetric 4 x 5 power. Skin: No rashes, lesions or ulcers Psychiatry: Judgement and insight appear normal. Mood & affect appropriate.   Data Reviewed:   CBC: Recent Labs  Lab 12/18/17 0255 12/19/17 0248 12/20/17 0608 12/21/17 0411 12/22/17 0448  WBC 9.6 9.5 13.9* 13.4* 14.1*  NEUTROABS  --  7.3 11.4* 11.2* 10.8*  HGB 11.1* 11.1* 11.1* 11.8* 12.0*  HCT 36.9* 37.3* 39.4 40.4 39.5  MCV 96.1 98.2 102.3* 99.5 95.0  PLT 237 232 230 200 409   Basic Metabolic Panel: Recent Labs  Lab 12/18/17 0255 12/19/17 0054 12/19/17 0248 12/20/17 0608 12/21/17 0411 12/22/17 0448 12/23/17 0226  NA 145 151*  --   --  146* 138 138  K 4.8 4.8  --   --  5.2* 4.1 3.5  CL 106 114*  --   --  107 100* 98*  CO2 27 28  --   --  _0 GLUCOSE 344* 330*  --   --  203* 225* 190*  BUN 85* 89*  --   --  72* 63* 47*  CREATININE 1.95* 1.74*  --   --  1.34* 1.34* 1.57*  CALCIUM 9.1 9.5  --   --  9.7 9.4 9.1  MG 2.6*  --  2.7* 2.8* 2.4 2.1 2.1  PHOS 3.8  --  3.2 4.3 4.8* 3.2  --    GFR: Estimated Creatinine Clearance: 67.6 mL/min (A) (by C-G formula based on SCr of 1.57 mg/dL (H)). Liver Function Tests: No results for input(s): AST, ALT, ALKPHOS, BILITOT, PROT, ALBUMIN in the last 168 hours. No results for input(s): LIPASE, AMYLASE in the last 168 hours. No results for input(s): AMMONIA in the last 168 hours. Coagulation Profile: No results for input(s): INR, PROTIME in the last 168 hours. Cardiac Enzymes: Recent Labs  Lab 12/19/17 0248  TROPONINI <0.03   BNP (last 3 results) No results for input(s): PROBNP in the last 8760 hours. HbA1C: No results for input(s):  HGBA1C in the last 72 hours. CBG: Recent Labs  Lab 12/22/17 2101 12/22/17 2206 12/23/17 0031 12/23/17 0425 12/23/17 0738  GLUCAP 338* 361* 223* 139* 137*   Lipid Profile: No results for input(s): CHOL, HDL, LDLCALC, TRIG, CHOLHDL, LDLDIRECT in the last 72 hours. Thyroid Function Tests: No results for input(s): TSH, T4TOTAL, FREET4, T3FREE, THYROIDAB in the last 72 hours. Anemia Panel: No results for input(s): VITAMINB12, FOLATE, FERRITIN, TIBC, IRON, RETICCTPCT in the last 72 hours. Sepsis Labs: No results for input(s): PROCALCITON, LATICACIDVEN in the last 168 hours.  Recent Results (from the past 240 hour(s))  MRSA PCR Screening     Status: None   Collection Time: 12/15/17  9:32 AM  Result Value Ref Range Status   MRSA by PCR NEGATIVE NEGATIVE Final    Comment:        The GeneXpert MRSA Assay (FDA approved for NASAL specimens only), is one component of a comprehensive MRSA colonization surveillance program. It is not intended to diagnose MRSA  infection nor to guide or monitor treatment for MRSA infections. Performed at [facility], [place] [address], [place], [place] [address]   Pneumocystis smear by DFA     Status: None   Collection Time: 12/15/17  4:05 PM  Result Value Ref Range Status   Specimen Source-PJSRC BRONCHIAL ALVEOLAR LAVAGE  Final   Pneumocystis jiroveci Ag NEGATIVE  Final    Comment: Performed at [facility] Performed at [facility], [address] [place], [place] [address]   Culture, bal-quantitative     Status: Abnormal   Collection Time: 12/15/17  6:24 PM  Result Value Ref Range Status   Specimen Description BRONCHIAL ALVEOLAR LAVAGE  Final   Special Requests NONE  Final   Gram Stain   Final    ABUNDANT WBC PRESENT,BOTH PMN AND MONONUCLEAR FEW GRAM POSITIVE COCCI    Culture (A)  Final    60,000 COLONIES/mL Consistent with normal respiratory flora. Performed at [facility], [place] [address] [place], [place]  [address]    Report Status 12/18/2017 FINAL  Final      Radiology Studies: No results found.  Scheduled Meds: . amitriptyline  100 mg Oral QHS  . apixaban  5 mg Per Tube BID  . bictegravir-emtricitabine-tenofovir AF  1 tablet Oral Daily  . dapsone  100 mg Oral Daily  . diltiazem  240 mg Oral Daily  . docusate  100 mg Per Tube BID  . fluconazole  100 mg Oral Daily  . gabapentin  300 mg Oral TID  . hydrALAZINE  50 mg Oral Q8H  . insulin aspart  3-9 Units Subcutaneous Q4H  . insulin detemir  15 Units Subcutaneous Q12H  . ipratropium  0.5 mg Nebulization TID  . isosorbide mononitrate  30 mg Oral Daily  . levalbuterol  0.63 mg Nebulization TID  . mouth rinse  15 mL Mouth Rinse BID  . mometasone-formoterol  2 puff Inhalation BID  . nicotine  21 mg Transdermal Daily  . [START ON 12/25/2017] predniSONE  20 mg Oral Q breakfast  . predniSONE  40 mg Oral Q breakfast  . sodium chloride flush  3 mL Intravenous Q12H   Continuous Infusions: . sodium chloride Stopped (12/19/17 1443)     LOS: 9 days   I have spent 35 minutes face to face with the patient and on the [place] discussing the patients care, assessment, plan and disposition with other care givers. >50% of the time was devoted counseling the patient about the risks and benefits of treatment and coordinating care.   Juquan Reznick Arsenio Loader, MD [facility] Pager [phone]   If 7PM-7AM, please contact night-coverage [email or web address] Password [email or web address] 12/23/2017, 11:01 AM

## 2017-12-24 LAB — CBC
HCT: 34.8 % — ABNORMAL LOW (ref 39.0–52.0)
Hemoglobin: 10.3 g/dL — ABNORMAL LOW (ref 13.0–17.0)
MCH: 28.3 pg (ref 26.0–34.0)
MCHC: 29.6 g/dL — AB (ref 30.0–36.0)
MCV: 95.6 fL (ref 78.0–100.0)
Platelets: 173 10*3/uL (ref 150–400)
RBC: 3.64 MIL/uL — ABNORMAL LOW (ref 4.22–5.81)
RDW: 13.5 % (ref 11.5–15.5)
WBC: 14.4 10*3/uL — ABNORMAL HIGH (ref 4.0–10.5)

## 2017-12-24 LAB — GLUCOSE, CAPILLARY
Glucose-Capillary: 138 mg/dL — ABNORMAL HIGH (ref 65–99)
Glucose-Capillary: 157 mg/dL — ABNORMAL HIGH (ref 65–99)
Glucose-Capillary: 235 mg/dL — ABNORMAL HIGH (ref 65–99)
Glucose-Capillary: 328 mg/dL — ABNORMAL HIGH (ref 65–99)

## 2017-12-24 LAB — BASIC METABOLIC PANEL
Anion gap: 9 (ref 5–15)
BUN: 35 mg/dL — AB (ref 6–20)
CALCIUM: 9.1 mg/dL (ref 8.9–10.3)
CO2: 31 mmol/L (ref 22–32)
Chloride: 102 mmol/L (ref 101–111)
Creatinine, Ser: 1.27 mg/dL — ABNORMAL HIGH (ref 0.61–1.24)
GFR calc Af Amer: >60 mL/min (ref >60)
GFR calc non Af Amer: >60 mL/min (ref >60)
GLUCOSE: 174 mg/dL — AB (ref 65–99)
Potassium: 3.7 mmol/L (ref 3.5–5.1)
SODIUM: 142 mmol/L (ref 135–145)

## 2017-12-24 LAB — MAGNESIUM: Magnesium: 2.1 mg/dL (ref 1.7–2.4)

## 2017-12-24 LAB — BRAIN NATRIURETIC PEPTIDE: B Natriuretic Peptide: 83.9 pg/mL (ref 0.0–100.0)

## 2017-12-24 NOTE — Evaluation (Signed)
Occupational Therapy Evaluation Patient Details Name: Chris [name]. MRN: [id number] DOB: [birth year] Today's Date: 12/24/2017    History of Present Illness 52 year old male patient with history of chronic respiratory failure in the setting of stage III chronic obstructive pulmonary disease and ongoing tobacco abuse.  Recently discharged after a prolonged hospitalization due to influenza pneumonia, resulting in ARDS, prolonged ventilator dependence, and ultimately tracheostomy as well as dialysis requirement.  Being treated for acute on chronic hypoxic and hypercarbic respiratory failure in the setting of acute exacerbation of chronic obstructive pulmonary disease and what appears to be significant component of laryngal pharyngeal reflux disease as evidenced by marked upper airway wheezing.    Clinical Impression   PTA Pt at home working with HHPT on community ambulation, and mod I for ADL. Pt is currently max A +2 for SPT (Stedy used) for [facility] and max A for LB ADL. Set up for seated grooming tasks. OT established theraband exercise program for BUE today and Pt able to demonstrate/perform teach back for understanding. Pt will benefit from continued skilled OT in the acute setting as well as afterwards at the SNF level.     Follow Up Recommendations  SNF;Supervision/Assistance - 24 hour    Equipment Recommendations  Other (comment)(defer to next venue)    Recommendations for Other Services       Precautions / Restrictions Precautions Precautions: Fall Restrictions Weight Bearing Restrictions: No      Mobility Bed Mobility Overal bed mobility: Needs Assistance Bed Mobility: Supine to Sit;Sit to Supine     Supine to sit: Min assist(for trunk elevation) Sit to supine: Mod assist(to assist with BLE)      Transfers                 General transfer comment: NT this session    Balance                                           ADL either performed or  assessed with clinical judgement   ADL Overall ADL's : Needs assistance/impaired Eating/Feeding: Set up;Bed level   Grooming: Set up;Bed level;Sitting   Upper Body Bathing: Min guard   Lower Body Bathing: Minimal assistance   Upper Body Dressing : Min guard   Lower Body Dressing: Maximal assistance;Bed level   Toilet Transfer: Maximal assistance;+2 for physical assistance;+2 for safety/equipment;BSC(with STEDY) Toilet Transfer Details (indicate cue type and reason): STEDY Toileting- Clothing Manipulation and Hygiene: Sit to/from stand;+2 for physical assistance;Maximal assistance       Functional mobility during ADLs: Maximal assistance;+2 for physical assistance;+2 for safety/equipment(Stedy) General ADL Comments: decreased activity tolerance     Vision         Perception     Praxis      Pertinent Vitals/Pain Pain Assessment: No/denies pain Pain Intervention(s): Monitored during session;Limited activity within patient's tolerance     Hand Dominance Right   Extremity/Trunk Assessment Upper Extremity Assessment Upper Extremity Assessment: Generalized weakness   Lower Extremity Assessment Lower Extremity Assessment: Defer to PT evaluation       Communication Communication Communication: No difficulties   Cognition Arousal/Alertness: Awake/alert Behavior During Therapy: Impulsive;Restless Overall Cognitive Status: Impaired/Different from baseline Area of Impairment: Attention;Memory;Safety/judgement;Problem solving                   Current Attention Level: Sustained Memory: Decreased short-term memory   Safety/Judgement: Decreased  awareness of safety;Decreased awareness of deficits   Problem Solving: Requires verbal cues;Requires tactile cues     General Comments  Pt on 6L O2    Exercises Exercises: General Upper Extremity General Exercises - Upper Extremity Shoulder Flexion: Both;Strengthening;10 reps;Supine;Theraband Theraband Level  (Shoulder Flexion): Level 1 (Yellow) Shoulder Extension: Both;Strengthening;10 reps;Supine;Theraband Theraband Level (Shoulder Extension): Level 1 (Yellow) Elbow Flexion: Strengthening;Both;10 reps;Supine;Theraband Theraband Level (Elbow Flexion): Level 1 (Yellow) Elbow Extension: Strengthening;Both;10 reps;Supine;Theraband Theraband Level (Elbow Extension): Level 1 (Yellow) Wrist Flexion: Strengthening;Both;10 reps;Supine;Theraband Theraband Level (Wrist Flexion): Level 1 (Yellow) Wrist Extension: Strengthening;10 reps;Both;Supine;Theraband Theraband Level (Wrist Extension): Level 1 (Yellow)   Shoulder Instructions      Home Living Family/patient expects to be discharged to:: Private residence Living Arrangements: Spouse/significant other Available Help at Discharge: Family;Available PRN/intermittently Type of Home: House Home Access: Level entry     Home Layout: One level     Bathroom Shower/Tub: Chief Strategy Officer: Standard     Home Equipment: Environmental consultant - 2 wheels;Bedside commode          Prior Functioning/Environment Level of Independence: Independent with assistive device(s)        Comments: pt had rehabbed at [facility] and was working with HHPT ambulating limited community distances without AD, independent with ADLs        OT Problem List: Decreased strength;Decreased range of motion;Decreased activity tolerance;Impaired balance (sitting and/or standing);Decreased cognition;Decreased knowledge of use of DME or AE      OT Treatment/Interventions: Self-care/ADL training;Therapeutic exercise;DME and/or AE instruction;Therapeutic activities;Patient/family education;Balance training    OT Goals(Current goals can be found in the care plan section) Acute Rehab OT Goals Patient Stated Goal: be able to stand up by himself again OT Goal Formulation: With patient Time For Goal Achievement: 01/07/18 Potential to Achieve Goals: Good ADL Goals Pt Will  Perform Upper Body Dressing: with modified independence;sitting Pt Will Perform Lower Body Dressing: with min guard assist;sit to/from stand Pt Will Transfer to Toilet: with supervision;ambulating Pt Will Perform Toileting - Clothing Manipulation and hygiene: with supervision;sit to/from stand Pt/caregiver will Perform Home Exercise Program: Increased strength;Both right and left upper extremity;With theraband;With written HEP provided  OT Frequency: Min 2X/week   Barriers to D/C:            Co-evaluation              AM-PAC PT "6 Clicks" Daily Activity     Outcome Measure Help from another person eating meals?: None Help from another person taking care of personal grooming?: A Little Help from another person toileting, which includes using toliet, bedpan, or urinal?: A Lot Help from another person bathing (including washing, rinsing, drying)?: A Lot Help from another person to put on and taking off regular upper body clothing?: None Help from another person to put on and taking off regular lower body clothing?: A Lot 6 Click Score: 17   End of Session Equipment Utilized During Treatment: Other (comment);Oxygen(theraband) Nurse Communication: Need for lift equipment;Mobility status;Other (comment)(Pt is complaining about missing cell phone)  Activity Tolerance: Patient tolerated treatment well Patient left: in bed;with call bell/phone within reach;Other (comment)(all 4 rails up by Pt request)  OT Visit Diagnosis: Unsteadiness on feet (R26.81);Other abnormalities of gait and mobility (R26.89);Muscle weakness (generalized) (M62.81);Other symptoms and signs involving cognitive function                Time: 1416-1443 OT Time Calculation (min): 27 min Charges:  OT General Charges $OT Visit: 1 Visit OT Evaluation $  OT Eval Moderate Complexity: 1 Mod OT Treatments $Therapeutic Exercise: 8-22 mins G-Codes:     Sherryl Manges OTR/L [phone]  Chris [name] 12/24/2017,  3:15 PM

## 2017-12-24 NOTE — Consult Note (Signed)
   [facility] CM Inpatient Consult   12/24/2017  Pj Zehner Geissinger Jr. 1966-03-27 [id number]  Patient screened for potential [place] [facility] services. Patient is in the [place] [facility] the [place] [facility] services under patient's [facility]  plan.  Chart review [copy per MD note]  reveals the patient is a 52 year old African-American male with history of COPD requiring as needed home oxygen, essential hypertension, chronic diastolic CHF ejection fraction 60%, HIV positive, diabetes mellitus type 2, ongoing tobacco use, paroxysmal atrial fibrillation on Eliquis, recent influenza induced ARDS requiring tracheostomy intubation and few episodes of dialysis was admitted to the hospital for acute on chronic hypoxic respiratory failure due to COPD exacerbation.  He was initially taken to the ICU as he required BiPAP. Extubated and now on nasal cannula. Met with the patient at the bedside.  Patient states he is hoping to go to [facility] for rehab as he is currently very weak and physical therapy is recommending a skilled nursing facility stay. Introduced [facility] in the post hospital follow up.  He endorses Geryl Rankins, NP as his primary care provider at [facility].  This office is listed to provide the transition of care follow up. Patient accepted a brochure and 24 hour nurse advise line with contact information. Patient likely to go to SNF at discharge.  Inpatient RNCM made aware that writer had seen patient.    Please place a [facility] Care Management consult or for questions contact:   Natividad Brood, RN BSN [facility] Liaison  [phone] business mobile phone Toll free office [phone]

## 2017-12-24 NOTE — Progress Notes (Signed)
PROGRESS NOTE    Chris [name].  QVZ:[id number] DOB: Jun 06, 1966 DOA: 12/14/2017 PCP: [name], NP   Brief Narrative:  52 year old African-American male with history of COPD requiring as needed home oxygen, essential hypertension, chronic diastolic CHF ejection fraction 60%, positive, diabetes mellitus type 2, ongoing tobacco use, paroxysmal atrial fibrillation on Eliquis, recent influenza induced ARDS requiring tracheostomy intubation and few episodes of dialysis was admitted to the hospital for acute on chronic hypoxic respiratory failure due to COPD exacerbation.  He was initially taken to the ICU as he required BiPAP.  Over the course of several days his oxygen was weaned off and transition to medical service.   Assessment & Plan:   Principal Problem:   COPD exacerbation ([place]) Active Problems:   Obstructive sleep apnea syndrome   Essential hypertension, benign   Cigarette nicotine dependence without complication   GERD (gastroesophageal reflux disease)   Acute on chronic respiratory failure with hypoxia (HCC)   PAF (paroxysmal atrial fibrillation) (HCC)   Diabetes mellitus type 2 in obese (HCC)   HIV (human immunodeficiency virus infection) ([place])   Acute on chronic hypoxic respiratory failure; improving Acute moderate to severe exacerbation- advanced persistent COPD - Currently patient is on tapered dose of prednisone orally -Continue Xopenex every 6 hours scheduled and as necessary.  Patient is already on Dulera twice daily and Atrovent -Wean off oxygen as necessary.  Currently is on 5 L high flow nasal cannula  History of atrial fibrillation stable -Continue Eliquis 5 mg twice daily, Cardizem 240 mg daily  Chronic diastolic CHF, ejection fraction 60% -Compensated, appears to be euvolemic.  Continue home medications  Diabetes mellitus type 2 Peripheral neuropathy -Gabapentin 3 times daily 300 mg -Continue home diabetic medications  Essential hypertension -  Patient is on Cardizem, Imdur and hydralazine has been added here  Oral Candida -On oral Diflucan until Jan 02, 2018  Morbid obesity with obstructive sleep apnea -Use CPAP at bedtime.  Follow-up outpatient with primary care physician  Physical therapy recommends skilled nursing facility  DVT prophylaxis: Eliquis Code Status: Full code Family Communication:   No family at bedside Disposition Plan: Maintain inpatient stay for another 24-48 hours until once breathing improves  Consultants:   None  Procedures:   None  Antimicrobials:   On Diflucan until Jan 02, 2018   Subjective: No acute events overnight.  Patient is any complaints at this time.  Review of Systems Otherwise negative except as per HPI, including: HEENT/EYES = negative for pain, redness, loss of vision, double vision, blurred vision, loss of hearing, sore throat, hoarseness, dysphagia Cardiovascular= negative for chest pain, palpitation, murmurs, lower extremity swelling Respiratory/lungs= negative forcough, hemoptysis, wheezing, mucus production Gastrointestinal= negative for nausea, vomiting,, abdominal pain, melena, hematemesis Genitourinary= negative for Dysuria, Hematuria, Change in Urinary Frequency MSK = Negative for arthralgia, myalgias, Back Pain, Joint swelling  Neurology= Negative for headache, seizures, numbness, tingling  Psychiatry= Negative for anxiety, depression, suicidal and homocidal ideation Allergy/Immunology= Medication/Food allergy as listed  Skin= Negative for Rash, lesions, ulcers, itching   Objective: Vitals:   12/24/17 0600 12/24/17 0628 12/24/17 0748 12/24/17 0757  BP:  140/71  (!) 149/71  Pulse:    84  Resp:    (!) 22  Temp:      TempSrc:      SpO2:  97% 92% 100%  Weight: 105.8 kg (233 lb 4 oz)     Height:        Intake/Output Summary (Last 24 hours) at 12/24/2017  1115 Last data filed at 12/24/2017 0754 Gross per 24 hour  Intake 243 ml  Output 1600 ml  Net -1357 ml     Filed Weights   12/22/17 0423 12/23/17 0427 12/24/17 0600  Weight: 102.6 kg (226 lb 3.1 oz) 108.7 kg (239 lb 10.2 oz) 105.8 kg (233 lb 4 oz)    Examination: Constitutional: NAD, calm, comfortable Eyes: PERRL, lids and conjunctivae normal ENMT: Mucous membranes are moist. Posterior pharynx clear of any exudate or lesions.Normal dentition.  Neck: normal, supple, no masses, no thyromegaly Respiratory: Diminished breath sounds at the bases Cardiovascular: Regular rate and rhythm, no murmurs / rubs / gallops. No extremity edema. 2+ pedal pulses. No carotid bruits.  Abdomen: no tenderness, no masses palpated. No hepatosplenomegaly. Bowel sounds positive.  Musculoskeletal: no clubbing / cyanosis. No joint deformity upper and lower extremities. Good ROM, no contractures. Normal muscle tone.  Skin: no rashes, lesions, ulcers. No induration Neurologic: CN 2-12 grossly intact. Sensation intact, DTR normal. Strength 4/5 in all 4.  Psychiatric: Normal judgment and insight. Alert and oriented x 3. Normal mood.   Data Reviewed:   CBC: Recent Labs  Lab 12/19/17 0248 12/20/17 0608 12/21/17 0411 12/22/17 0448 12/24/17 0324  WBC 9.5 13.9* 13.4* 14.1* 14.4*  NEUTROABS 7.3 11.4* 11.2* 10.8*  --   HGB 11.1* 11.1* 11.8* 12.0* 10.3*  HCT 37.3* 39.4 40.4 39.5 34.8*  MCV 98.2 102.3* 99.5 95.0 95.6  PLT 232 230 200 197 810   Basic Metabolic Panel: Recent Labs  Lab 12/18/17 0255 12/19/17 0054 12/19/17 0248 12/20/17 1751 12/21/17 0411 12/22/17 0448 12/23/17 0226 12/24/17 0324  NA 145 151*  --   --  146* 138 138 142  K 4.8 4.8  --   --  5.2* 4.1 3.5 3.7  CL 106 114*  --   --  107 100* 98* 102  CO2 27 28  --   --  _0 GLUCOSE 344* 330*  --   --  203* 225* 190* 174*  BUN 85* 89*  --   --  72* 63* 47* 35*  CREATININE 1.95* 1.74*  --   --  1.34* 1.34* 1.57* 1.27*  CALCIUM 9.1 9.5  --   --  9.7 9.4 9.1 9.1  MG 2.6*  --  2.7* 2.8* 2.4 2.1 2.1 2.1  PHOS 3.8  --  3.2 4.3 4.8* 3.2  --    --    GFR: Estimated Creatinine Clearance: 82.4 mL/min (A) (by C-G formula based on SCr of 1.27 mg/dL (H)). Liver Function Tests: No results for input(s): AST, ALT, ALKPHOS, BILITOT, PROT, ALBUMIN in the last 168 hours. No results for input(s): LIPASE, AMYLASE in the last 168 hours. No results for input(s): AMMONIA in the last 168 hours. Coagulation Profile: No results for input(s): INR, PROTIME in the last 168 hours. Cardiac Enzymes: Recent Labs  Lab 12/19/17 0248  TROPONINI <0.03   BNP (last 3 results) No results for input(s): PROBNP in the last 8760 hours. HbA1C: No results for input(s): HGBA1C in the last 72 hours. CBG: Recent Labs  Lab 12/23/17 1549 12/23/17 1959 12/23/17 2126 12/24/17 0557 12/24/17 0800  GLUCAP 251* 309* 240* 138* 157*   Lipid Profile: No results for input(s): CHOL, HDL, LDLCALC, TRIG, CHOLHDL, LDLDIRECT in the last 72 hours. Thyroid Function Tests: No results for input(s): TSH, T4TOTAL, FREET4, T3FREE, THYROIDAB in the last 72 hours. Anemia Panel: No results for input(s): VITAMINB12, FOLATE, FERRITIN, TIBC, IRON, RETICCTPCT in the last  72 hours. Sepsis Labs: No results for input(s): PROCALCITON, LATICACIDVEN in the last 168 hours.  Recent Results (from the past 240 hour(s))  MRSA PCR Screening     Status: None   Collection Time: 12/15/17  9:32 AM  Result Value Ref Range Status   MRSA by PCR NEGATIVE NEGATIVE Final    Comment:        The GeneXpert MRSA Assay (FDA approved for NASAL specimens only), is one component of a comprehensive MRSA colonization surveillance program. It is not intended to diagnose MRSA infection nor to guide or monitor treatment for MRSA infections. Performed at [facility], [place] [address] [place], [place] [address]   Pneumocystis smear by DFA     Status: None   Collection Time: 12/15/17  4:05 PM  Result Value Ref Range Status   Specimen Source-PJSRC BRONCHIAL ALVEOLAR LAVAGE  Final   Pneumocystis  jiroveci Ag NEGATIVE  Final    Comment: Performed at [facility] Performed at [facility], [address], [place], [place] [address]   Culture, bal-quantitative     Status: Abnormal   Collection Time: 12/15/17  6:24 PM  Result Value Ref Range Status   Specimen Description BRONCHIAL ALVEOLAR LAVAGE  Final   Special Requests NONE  Final   Gram Stain   Final    ABUNDANT WBC PRESENT,BOTH PMN AND MONONUCLEAR FEW GRAM POSITIVE COCCI    Culture (A)  Final    60,000 COLONIES/mL Consistent with normal respiratory flora. Performed at [facility], [place] [address] [place], [place] [address]    Report Status 12/18/2017 FINAL  Final      Radiology Studies: No results found.  Scheduled Meds: . amitriptyline  100 mg Oral QHS  . apixaban  5 mg Per Tube BID  . bictegravir-emtricitabine-tenofovir AF  1 tablet Oral Daily  . dapsone  100 mg Oral Daily  . diltiazem  240 mg Oral Daily  . docusate  100 mg Per Tube BID  . fluconazole  100 mg Oral Daily  . gabapentin  300 mg Oral TID  . hydrALAZINE  50 mg Oral Q8H  . insulin aspart  0-15 Units Subcutaneous TID WC  . insulin aspart  0-5 Units Subcutaneous QHS  . ipratropium  0.5 mg Nebulization TID  . isosorbide mononitrate  30 mg Oral Daily  . levalbuterol  0.63 mg Nebulization Q6H  . mouth rinse  15 mL Mouth Rinse BID  . mometasone-formoterol  2 puff Inhalation BID  . nicotine  21 mg Transdermal Daily  . [START ON 12/25/2017] predniSONE  20 mg Oral Q breakfast  . sodium chloride flush  3 mL Intravenous Q12H   Continuous Infusions: . sodium chloride Stopped (12/19/17 1443)     LOS: 10 days   I have spent 35 minutes face to face with the patient and on the [place] discussing the patients care, assessment, plan and disposition with other care givers. >50% of the time was devoted counseling the patient about the risks and benefits of treatment and coordinating care.   Clydia Nieves Arsenio Loader, MD [facility] Pager  [phone]   If 7PM-7AM, please contact night-coverage [email or web address] Password [email or web address] 12/24/2017, 11:15 AM

## 2017-12-25 ENCOUNTER — Telehealth: Payer: Self-pay | Admitting: Nurse Practitioner

## 2017-12-25 LAB — CBC
HEMATOCRIT: 34.1 % — AB (ref 39.0–52.0)
Hemoglobin: 10.1 g/dL — ABNORMAL LOW (ref 13.0–17.0)
MCH: 28.6 pg (ref 26.0–34.0)
MCHC: 29.6 g/dL — ABNORMAL LOW (ref 30.0–36.0)
MCV: 96.6 fL (ref 78.0–100.0)
PLATELETS: 200 10*3/uL (ref 150–400)
RBC: 3.53 MIL/uL — ABNORMAL LOW (ref 4.22–5.81)
RDW: 13.9 % (ref 11.5–15.5)
WBC: 14.6 10*3/uL — AB (ref 4.0–10.5)

## 2017-12-25 LAB — GLUCOSE, CAPILLARY
GLUCOSE-CAPILLARY: 167 mg/dL — AB (ref 65–99)
GLUCOSE-CAPILLARY: 287 mg/dL — AB (ref 65–99)
Glucose-Capillary: 122 mg/dL — ABNORMAL HIGH (ref 65–99)
Glucose-Capillary: 155 mg/dL — ABNORMAL HIGH (ref 65–99)
Glucose-Capillary: 369 mg/dL — ABNORMAL HIGH (ref 65–99)

## 2017-12-25 LAB — BASIC METABOLIC PANEL
ANION GAP: 9 (ref 5–15)
BUN: 27 mg/dL — ABNORMAL HIGH (ref 6–20)
CHLORIDE: 102 mmol/L (ref 101–111)
CO2: 31 mmol/L (ref 22–32)
CREATININE: 1.21 mg/dL (ref 0.61–1.24)
Calcium: 8.9 mg/dL (ref 8.9–10.3)
GFR calc non Af Amer: >60 mL/min (ref >60)
Glucose, Bld: 162 mg/dL — ABNORMAL HIGH (ref 65–99)
Potassium: 3.5 mmol/L (ref 3.5–5.1)
Sodium: 142 mmol/L (ref 135–145)

## 2017-12-25 LAB — MAGNESIUM: Magnesium: 1.9 mg/dL (ref 1.7–2.4)

## 2017-12-25 NOTE — Progress Notes (Signed)
PROGRESS NOTE    Chris [name].  IOE:[id number] DOB: 1965/11/09 DOA: 12/14/2017 PCP: [name], NP   Brief Narrative:  52 year old African-American male with history of COPD requiring as needed home oxygen, essential hypertension, chronic diastolic CHF ejection fraction 60%, positive, diabetes mellitus type 2, ongoing tobacco use, paroxysmal atrial fibrillation on Eliquis, recent influenza induced ARDS requiring tracheostomy intubation and few episodes of dialysis was admitted to the hospital for acute on chronic hypoxic respiratory failure due to COPD exacerbation.  He was initially taken to the ICU as he required BiPAP.  Over the course of several days his oxygen was weaned off and transition to medical service.   Assessment & Plan:   Principal Problem:   COPD exacerbation ([place]) Active Problems:   Obstructive sleep apnea syndrome   Essential hypertension, benign   Cigarette nicotine dependence without complication   GERD (gastroesophageal reflux disease)   Acute on chronic respiratory failure with hypoxia (HCC)   PAF (paroxysmal atrial fibrillation) (HCC)   Diabetes mellitus type 2 in obese (HCC)   HIV (human immunodeficiency virus infection) ([place])   Acute on chronic hypoxic respiratory failure; improving Acute moderate to severe exacerbation- advanced persistent COPD; slowly improving.  - Currently patient is on tapered dose of prednisone orally- may have to prolong his taper course.  -Continue Xopenex every 6 hours scheduled and as necessary.  Patient is already on Dulera twice daily and Atrovent - Patient is still requiring 5 L nasal cannula on high flow  History of atrial fibrillation stable -Continue Eliquis 5 mg twice daily, Cardizem 240 mg daily  Chronic diastolic CHF, ejection fraction 60% -Appears euvolemic and well compensated therefore we will continue his current home medications  Diabetes mellitus type 2 Peripheral neuropathy -Gabapentin 3 times daily 300  mg -Continue home diabetic medications  Essential hypertension - Patient is on Cardizem, Imdur and hydralazine has been added here  Oral Candida -On oral Diflucan until Jan 02, 2018  Morbid obesity with obstructive sleep apnea -Use CPAP at bedtime.  Follow-up outpatient with primary care physician  Physical therapy is recommending skilled nursing facility.  Social worker is aware  DVT prophylaxis: Eliquis Code Status: Full code Family Communication:   No family is at the bedside Disposition Plan: Maintain inpatient stay for another 24 hours, social worker working on placement.  Consultants:   None  Procedures:   None  Antimicrobials:   On Diflucan until Jan 02, 2018   Subjective: No acute events overnight.  Remains afebrile.  This morning she reports of exertional shortness of breath and appears quite weak  Review of Systems Otherwise negative except as per HPI, including: HEENT/EYES = negative for pain, redness, loss of vision, double vision, blurred vision, loss of hearing, sore throat, hoarseness, dysphagia Cardiovascular= negative for chest pain, palpitation, murmurs, lower extremity swelling Respiratory/lungs= negative for  cough, hemoptysis, wheezing, mucus production Gastrointestinal= negative for nausea, vomiting,, abdominal pain, melena, hematemesis Genitourinary= negative for Dysuria, Hematuria, Change in Urinary Frequency MSK = Negative for arthralgia, myalgias, Back Pain, Joint swelling  Neurology= Negative for headache, seizures, numbness, tingling  Psychiatry= Negative for anxiety, depression, suicidal and homocidal ideation Allergy/Immunology= Medication/Food allergy as listed  Skin= Negative for Rash, lesions, ulcers, itching    Objective: Vitals:   12/25/17 0241 12/25/17 0622 12/25/17 0824 12/25/17 0829  BP:  138/79 103/66   Pulse:   85   Resp:   20   Temp:   97.9 F (36.6 C)   TempSrc:   Oral  SpO2: 92%  98% 96%  Weight:      Height:         Intake/Output Summary (Last 24 hours) at 12/25/2017 1322 Last data filed at 12/25/2017 0900 Gross per 24 hour  Intake 1263 ml  Output 4950 ml  Net -3687 ml   Filed Weights   12/23/17 0427 12/24/17 0600 12/25/17 0224  Weight: 108.7 kg (239 lb 10.2 oz) 105.8 kg (233 lb 4 oz) 105.1 kg (231 lb 11.3 oz)    Examination: Constitutional: NAD, calm, comfortable Eyes: PERRL, lids and conjunctivae normal ENMT: Mucous membranes are moist. Posterior pharynx clear of any exudate or lesions.Normal dentition.  Neck: normal, supple, no masses, no thyromegaly Respiratory: Diminished breath sounds at the bases with minimal wheezing Cardiovascular: Regular rate and rhythm, no murmurs / rubs / gallops. No extremity edema. 2+ pedal pulses. No carotid bruits.  Abdomen: no tenderness, no masses palpated. No hepatosplenomegaly. Bowel sounds positive.  Musculoskeletal: no clubbing / cyanosis. No joint deformity upper and lower extremities. Good ROM, no contractures. Normal muscle tone.  Skin: no rashes, lesions, ulcers. No induration Neurologic: CN 2-12 grossly intact. Sensation intact, DTR normal. Strength 4/5 in all 4.  Psychiatric: Normal judgment and insight. Alert and oriented x 3. Normal mood.    Data Reviewed:   CBC: Recent Labs  Lab 12/19/17 0248 12/20/17 1638 12/21/17 0411 12/22/17 0448 12/24/17 0324 12/25/17 0303  WBC 9.5 13.9* 13.4* 14.1* 14.4* 14.6*  NEUTROABS 7.3 11.4* 11.2* 10.8*  --   --   HGB 11.1* 11.1* 11.8* 12.0* 10.3* 10.1*  HCT 37.3* 39.4 40.4 39.5 34.8* 34.1*  MCV 98.2 102.3* 99.5 95.0 95.6 96.6  PLT 232 230 200 197 173 466   Basic Metabolic Panel: Recent Labs  Lab 12/19/17 0248 12/20/17 0608 12/21/17 0411 12/22/17 0448 12/23/17 0226 12/24/17 0324 12/25/17 0303  NA  --   --  146* 138 138 142 142  K  --   --  5.2* 4.1 3.5 3.7 3.5  CL  --   --  107 100* 98* 102 102  CO2  --   --  _0 GLUCOSE  --   --  203* 225* 190* 174* 162*  BUN  --   --  72* 63*  47* 35* 27*  CREATININE  --   --  1.34* 1.34* 1.57* 1.27* 1.21  CALCIUM  --   --  9.7 9.4 9.1 9.1 8.9  MG 2.7* 2.8* 2.4 2.1 2.1 2.1 1.9  PHOS 3.2 4.3 4.8* 3.2  --   --   --    GFR: Estimated Creatinine Clearance: 86.3 mL/min (by C-G formula based on SCr of 1.21 mg/dL). Liver Function Tests: No results for input(s): AST, ALT, ALKPHOS, BILITOT, PROT, ALBUMIN in the last 168 hours. No results for input(s): LIPASE, AMYLASE in the last 168 hours. No results for input(s): AMMONIA in the last 168 hours. Coagulation Profile: No results for input(s): INR, PROTIME in the last 168 hours. Cardiac Enzymes: Recent Labs  Lab 12/19/17 0248  TROPONINI <0.03   BNP (last 3 results) No results for input(s): PROBNP in the last 8760 hours. HbA1C: No results for input(s): HGBA1C in the last 72 hours. CBG: Recent Labs  Lab 12/24/17 1211 12/24/17 1615 12/24/17 2110 12/25/17 0728 12/25/17 1153  GLUCAP 235* 328* 167* 122* 155*   Lipid Profile: No results for input(s): CHOL, HDL, LDLCALC, TRIG, CHOLHDL, LDLDIRECT in the last 72 hours. Thyroid Function Tests: No results for input(s): TSH,  T4TOTAL, FREET4, T3FREE, THYROIDAB in the last 72 hours. Anemia Panel: No results for input(s): VITAMINB12, FOLATE, FERRITIN, TIBC, IRON, RETICCTPCT in the last 72 hours. Sepsis Labs: No results for input(s): PROCALCITON, LATICACIDVEN in the last 168 hours.  Recent Results (from the past 240 hour(s))  Pneumocystis smear by DFA     Status: None   Collection Time: 12/15/17  4:05 PM  Result Value Ref Range Status   Specimen Source-PJSRC BRONCHIAL ALVEOLAR LAVAGE  Final   Pneumocystis jiroveci Ag NEGATIVE  Final    Comment: Performed at [facility] Performed at [facility], [address], [place], [place] [address]   Culture, bal-quantitative     Status: Abnormal   Collection Time: 12/15/17  6:24 PM  Result Value Ref Range Status   Specimen Description BRONCHIAL ALVEOLAR LAVAGE  Final    Special Requests NONE  Final   Gram Stain   Final    ABUNDANT WBC PRESENT,BOTH PMN AND MONONUCLEAR FEW GRAM POSITIVE COCCI    Culture (A)  Final    60,000 COLONIES/mL Consistent with normal respiratory flora. Performed at [facility], [place] [address], [place], [place] [address]    Report Status 12/18/2017 FINAL  Final      Radiology Studies: No results found.  Scheduled Meds: . amitriptyline  100 mg Oral QHS  . apixaban  5 mg Per Tube BID  . bictegravir-emtricitabine-tenofovir AF  1 tablet Oral Daily  . dapsone  100 mg Oral Daily  . diltiazem  240 mg Oral Daily  . docusate  100 mg Per Tube BID  . fluconazole  100 mg Oral Daily  . gabapentin  300 mg Oral TID  . hydrALAZINE  50 mg Oral Q8H  . insulin aspart  0-15 Units Subcutaneous TID WC  . insulin aspart  0-5 Units Subcutaneous QHS  . ipratropium  0.5 mg Nebulization TID  . isosorbide mononitrate  30 mg Oral Daily  . levalbuterol  0.63 mg Nebulization Q6H  . mouth rinse  15 mL Mouth Rinse BID  . mometasone-formoterol  2 puff Inhalation BID  . nicotine  21 mg Transdermal Daily  . predniSONE  20 mg Oral Q breakfast  . sodium chloride flush  3 mL Intravenous Q12H   Continuous Infusions: . sodium chloride Stopped (12/19/17 1443)     LOS: 11 days   I have spent 25 minutes face to face with the patient and on the [place] discussing the patients care, assessment, plan and disposition with other care givers. >50% of the time was devoted counseling the patient about the risks and benefits of treatment and coordinating care.   Ankit Arsenio Loader, MD [facility] Pager [phone]   If 7PM-7AM, please contact night-coverage [email or web address] Password [email or web address] 12/25/2017, 1:22 PM

## 2017-12-25 NOTE — Progress Notes (Signed)
Physical Therapy Treatment Patient Details Name: Chris [name]. MRN: [id number] DOB: 1966-03-03 Today's Date: 12/25/2017    History of Present Illness 52 year old male patient with history of chronic respiratory failure in the setting of stage III chronic obstructive pulmonary disease and ongoing tobacco abuse.  Recently discharged after a prolonged hospitalization due to influenza pneumonia, resulting in ARDS, prolonged ventilator dependence, and ultimately tracheostomy as well as dialysis requirement.  Being treated for acute on chronic hypoxic and hypercarbic respiratory failure in the setting of acute exacerbation of chronic obstructive pulmonary disease and what appears to be significant component of laryngal pharyngeal reflux disease as evidenced by marked upper airway wheezing.     PT Comments    Pt continues to make slow progress towards his goals, however is limited in safe mobility by fatigue, decreased strength, and decreased safety awareness. Pt is currently min A for bed mobility, and minAx2 for sit>stand in [place]. Pt transferred to recliner where he was able to participate in therapeutic exercise prior to eating lunch. D/c plans remain appropriate to regain strength and endurance before ultimately going home. PT will continue to follow acutely.    Follow Up Recommendations  SNF     Equipment Recommendations  None recommended by PT       Precautions / Restrictions Precautions Precautions: Fall Restrictions Weight Bearing Restrictions: No    Mobility  Bed Mobility Overal bed mobility: Needs Assistance Bed Mobility: Supine to Sit;Sit to Supine     Supine to sit: Min assist(for trunk elevation) Sit to supine: (to assist with BLE)   General bed mobility comments: minA for bring trunk to upright  Transfers Overall transfer level: Needs assistance   Transfers: Sit to/from Stand Sit to Stand: Min assist;+2 physical assistance         General transfer comment:  minAx2 for 2x sit>stand to [facility] for pivot to recliner     Balance Overall balance assessment: Needs assistance Sitting-balance support: No upper extremity supported;Feet supported Sitting balance-Leahy Scale: Fair     Standing balance support: Bilateral upper extremity supported Standing balance-Leahy Scale: Poor                              Cognition Arousal/Alertness: Awake/alert Behavior During Therapy: Restless Overall Cognitive Status: Impaired/Different from baseline Area of Impairment: Attention;Memory;Safety/judgement;Problem solving                   Current Attention Level: Sustained Memory: Decreased short-term memory   Safety/Judgement: Decreased awareness of safety;Decreased awareness of deficits   Problem Solving: Requires verbal cues;Requires tactile cues        Exercises General Exercises - Lower Extremity Long Arc Quad: AROM;Both;10 reps;Seated Hip Flexion/Marching: AROM;Both;10 reps;Seated Toe Raises: AROM;10 reps;Both;Seated Heel Raises: AROM;Both;10 reps;Seated        Pertinent Vitals/Pain Pain Assessment: No/denies pain           PT Goals (current goals can now be found in the care plan section) Acute Rehab PT Goals Patient Stated Goal: be able to stand up by himself again PT Goal Formulation: With patient Time For Goal Achievement: 01/04/18 Potential to Achieve Goals: Fair Progress towards PT goals: Progressing toward goals    Frequency    Min 2X/week      PT Plan Frequency needs to be updated       AM-PAC PT "6 Clicks" Daily Activity  Outcome Measure  Difficulty turning over in bed (including adjusting bedclothes, sheets  and blankets)?: A Little Difficulty moving from lying on back to sitting on the side of the bed? : Unable Difficulty sitting down on and standing up from a chair with arms (e.g., wheelchair, bedside commode, etc,.)?: Unable Help needed moving to and from a bed to chair (including a  wheelchair)?: A Lot Help needed walking in hospital room?: Total Help needed climbing 3-5 steps with a railing? : Total 6 Click Score: 9    End of Session Equipment Utilized During Treatment: Gait belt;Oxygen Activity Tolerance: Patient limited by fatigue Patient left: in chair;with call bell/phone within reach;with chair alarm set Nurse Communication: Mobility status PT Visit Diagnosis: Unsteadiness on feet (R26.81);Other abnormalities of gait and mobility (R26.89);Muscle weakness (generalized) (M62.81);Difficulty in walking, not elsewhere classified (R26.2)     Time: 1308-6578 PT Time Calculation (min) (ACUTE ONLY): 13 min  Charges:  $Therapeutic Exercise: 8-22 mins                    G Codes:       Chris [name] PT, DPT Acute Rehabilitation  [phone] Pager [phone]     Chris [name] 12/25/2017, 3:22 PM

## 2017-12-25 NOTE — Telephone Encounter (Signed)
Briefly met with patient during his admission at [facility] myself to patient and informed he of the [facility] clinic and its services. Patient was about to eat lunch when I came in his room.  During our conversation patient's speech was not clear but he did inform me that he was not sure when he was going to be discharge but he would be going to rehab following it. Asked patient if he had support when he went home and he nodded yes but then stated that wife can cook and help him sometimes.   Patient stated that he no longer has a phone and can be contacted his wife's number. The number that he gave me [phone]) is different from the one on Epic ([phone]). Informed patient that he can call the office if he had any questions or concerns.

## 2017-12-26 LAB — CBC
HCT: 32.2 % — ABNORMAL LOW (ref 39.0–52.0)
Hemoglobin: 9.5 g/dL — ABNORMAL LOW (ref 13.0–17.0)
MCH: 28.1 pg (ref 26.0–34.0)
MCHC: 29.5 g/dL — ABNORMAL LOW (ref 30.0–36.0)
MCV: 95.3 fL (ref 78.0–100.0)
PLATELETS: 257 10*3/uL (ref 150–400)
RBC: 3.38 MIL/uL — AB (ref 4.22–5.81)
RDW: 13.7 % (ref 11.5–15.5)
WBC: 15.3 10*3/uL — AB (ref 4.0–10.5)

## 2017-12-26 LAB — BASIC METABOLIC PANEL
ANION GAP: 9 (ref 5–15)
BUN: 31 mg/dL — ABNORMAL HIGH (ref 6–20)
CALCIUM: 8.7 mg/dL — AB (ref 8.9–10.3)
CHLORIDE: 102 mmol/L (ref 101–111)
CO2: 31 mmol/L (ref 22–32)
Creatinine, Ser: 1.36 mg/dL — ABNORMAL HIGH (ref 0.61–1.24)
GFR calc non Af Amer: 59 mL/min — ABNORMAL LOW (ref >60)
Glucose, Bld: 208 mg/dL — ABNORMAL HIGH (ref 65–99)
Potassium: 3.7 mmol/L (ref 3.5–5.1)
Sodium: 142 mmol/L (ref 135–145)

## 2017-12-26 LAB — GLUCOSE, CAPILLARY
GLUCOSE-CAPILLARY: 271 mg/dL — AB (ref 65–99)
GLUCOSE-CAPILLARY: 307 mg/dL — AB (ref 65–99)
GLUCOSE-CAPILLARY: 356 mg/dL — AB (ref 65–99)
Glucose-Capillary: 240 mg/dL — ABNORMAL HIGH (ref 65–99)

## 2017-12-26 LAB — MAGNESIUM: Magnesium: 2 mg/dL (ref 1.7–2.4)

## 2017-12-26 MED ORDER — FLUCONAZOLE 100 MG PO TABS: 100.0000 mg | ORAL_TABLET | Freq: Every day | ORAL | 0 refills | Status: DC

## 2017-12-26 MED ORDER — DOCUSATE SODIUM 50 MG/5ML PO LIQD: 100.0000 mg | Freq: Two times a day (BID) | ORAL | 0 refills | Status: DC

## 2017-12-26 MED ORDER — PREDNISONE 20 MG PO TABS: 20.0000 mg | ORAL_TABLET | Freq: Every day | ORAL | 0 refills | Status: DC

## 2017-12-26 MED ORDER — HYDRALAZINE HCL 50 MG PO TABS: 50.0000 mg | ORAL_TABLET | Freq: Three times a day (TID) | ORAL | 0 refills | Status: DC

## 2017-12-26 MED ORDER — OXYCODONE HCL 5 MG PO TABS: 5.0000 mg | ORAL_TABLET | Freq: Three times a day (TID) | ORAL | 0 refills | Status: DC | PRN

## 2017-12-26 MED ORDER — ISOSORBIDE MONONITRATE ER 30 MG PO TB24: 30.0000 mg | ORAL_TABLET | Freq: Every day | ORAL | 0 refills | Status: DC

## 2017-12-26 MED ORDER — MOMETASONE FURO-FORMOTEROL FUM 200-5 MCG/ACT IN AERO: 2.0000 | INHALATION_SPRAY | Freq: Two times a day (BID) | RESPIRATORY_TRACT | 0 refills | Status: DC

## 2017-12-26 MED ORDER — DILTIAZEM HCL ER COATED BEADS 240 MG PO CP24: 240.0000 mg | ORAL_CAPSULE | Freq: Every day | ORAL | 0 refills | Status: DC

## 2017-12-26 NOTE — Discharge Summary (Addendum)
Physician Discharge Summary  Chris [name]. OAC:[id number] DOB: July 02, 1966 DOA: 12/14/2017  PCP: [name], NP  Admit date: 12/14/2017 Discharge date: 12/26/2017  Admitted From: Home  Disposition:  SNF  Recommendations for Outpatient Follow-up:  1. Follow up with PCP in 1-2 weeks 2. Please obtain BMP/CBC in one week your next doctors visit.  3. Taper dose of steroid as prescribed 4. Follow-up outpatient with pulmonary in 3-4 weeks  5. Imdur and hydralazine has been added to his regimen   Discharge Condition: Stable CODE STATUS: Full code Diet recommendation: Heart healthy diet  Brief/Interim Summary: 52 year old African-American male with history of COPD requiring as needed home oxygen, essential hypertension, chronic diastolic CHF ejection fraction 60%, positive, diabetes mellitus type 2, ongoing tobacco use, paroxysmal atrial fibrillation on Eliquis, recent influenza induced ARDS requiring tracheostomy intubation and few episodes of dialysis was admitted to the hospital for acute on chronic hypoxic respiratory failure due to COPD exacerbation.  He was initially taken to the ICU as he required BiPAP.  Over the course of several days his oxygen was weaned off and transition to medical service.  While on the medical floor, patient's taper dose of prednisone was continued along with scheduled bronchodilators.  Over the course of 3-4 days his high flow nasal cannula from 10 L was weaned down to 3-4 L.  He was evaluated by physical therapy and was deemed extremely weak and recommended he would benefit from skilled nursing facility.  Social worker was following and arrangements will be made appropriately.  Addendum 12/28/17 11am: Medical continues to improve and awaiting bed at Rehab.   Addendum 12/29/17 845am: Continue to improve medically. Awaiting Bed at the Rehab pending insurance approval.   Discharge Diagnoses:  Principal Problem:   COPD exacerbation ([place]) Active Problems:    Obstructive sleep apnea syndrome   Essential hypertension, benign   Cigarette nicotine dependence without complication   GERD (gastroesophageal reflux disease)   Acute on chronic respiratory failure with hypoxia (HCC)   PAF (paroxysmal atrial fibrillation) (HCC)   Diabetes mellitus type 2 in obese (HCC)   HIV (human immunodeficiency virus infection) ([place])   Acute on chronic hypoxic respiratory failure;impr-would recommend outpatient pulmonary follow-upoved Acute moderate to severe exacerbation- advanced persistent COPD; slowly improving.  - Currently patient is on tapered dose of prednisone orally- may have to prolong his taper course.  -Continue Xopenex every 6 hours scheduled and as necessary.  Patient is already on Dulera twice daily and Atrovent -   This morning patient'sOxygen requirement is down to 3 L nasal cannula.  - We will recommend outpatient pulmonary follow-up  History of atrial fibrillation stable -Continue Eliquis 5 mg twice daily, Cardizem 240 mg daily  Chronic diastolic CHF, ejection fraction 60% -Appears euvolemic and well compensated therefore we will continue his current home medications  Diabetes mellitus type 2 Peripheral neuropathy -Continue home dose of gabapentin 300 mg -Continue home diabetic medications  Essential hypertension - Patient is on Cardizem, Imdur and hydralazine has been added here  Oral Candida -On oral Diflucan until Jan 02, 2018  Morbid obesity with obstructive sleep apnea -Use CPAP at bedtime.  Follow-up outpatient with primary care physician  Physical therapy is recommending skilled nursing facility.    Eliquis while he is here for dvt ppx Full Code     Discharge Instructions   Allergies as of 12/26/2017      Reactions   Bactrim [sulfamethoxazole-trimethoprim] Hives      Medication List    TAKE  these medications   acetaminophen 500 MG tablet Commonly known as:  TYLENOL Take 1,000 mg by mouth every 6 (six)  hours as needed for headache (pain).   albuterol 108 (90 Base) MCG/ACT inhaler Commonly known as:  PROVENTIL HFA;VENTOLIN HFA Inhale 2 puffs into the lungs every 6 (six) hours as needed for wheezing or shortness of breath.   amitriptyline 100 MG tablet Commonly known as:  ELAVIL Take 1 tablet (100 mg total) by mouth at bedtime.   apixaban 5 MG Tabs tablet Commonly known as:  ELIQUIS Take 1 tablet (5 mg total) by mouth 2 (two) times daily for 15 days.   BIKTARVY 50-200-25 MG Tabs tablet Generic drug:  bictegravir-emtricitabine-tenofovir AF Take 1 tablet by mouth daily at 3 pm.   budesonide-formoterol 80-4.5 MCG/ACT inhaler Commonly known as:  SYMBICORT Inhale 2 puffs into the lungs 2 (two) times daily.   diltiazem 240 MG 24 hr capsule Commonly known as:  CARDIZEM CD Take 1 capsule (240 mg total) by mouth daily. What changed:    medication strength  how much to take   docusate 50 MG/5ML liquid Commonly known as:  COLACE Place 10 mLs (100 mg total) into feeding tube 2 (two) times daily.   fluconazole 100 MG tablet Commonly known as:  DIFLUCAN Take 1 tablet (100 mg total) by mouth daily for 5 days.   gabapentin 300 MG capsule Commonly known as:  NEURONTIN Take 1 capsule (300 mg total) by mouth 3 (three) times daily.   glucose blood test strip Commonly known as:  ONETOUCH VERIO Use as instructed   hydrALAZINE 50 MG tablet Commonly known as:  APRESOLINE Take 1 tablet (50 mg total) by mouth every 8 (eight) hours.   Insulin Detemir 100 UNIT/ML Pen Commonly known as:  LEVEMIR Inject 30 Units into the skin daily at 10 pm.   Insulin Pen Needle 31G X 5 MM Misc Commonly known as:  B-D UF III MINI PEN NEEDLES Use as instructed   isosorbide mononitrate 30 MG 24 hr tablet Commonly known as:  IMDUR Take 1 tablet (30 mg total) by mouth daily.   metFORMIN 1000 MG tablet Commonly known as:  GLUCOPHAGE Take 1 tablet (1,000 mg total) by mouth daily with breakfast.   Misc.  Devices Misc Please provide patient with a portable oximeter. Diagnosis: COPD GOLD III   mometasone-formoterol 200-5 MCG/ACT Aero Commonly known as:  DULERA Inhale 2 puffs into the lungs 2 (two) times daily.   ONETOUCH DELICA LANCETS 55D Misc Use as instructed   ONETOUCH VERIO w/Device Kit 1 kit by Does not apply route 2 (two) times daily.   oxyCODONE 5 MG immediate release tablet Commonly known as:  Oxy IR/ROXICODONE Take 1 tablet (5 mg total) by mouth 3 (three) times daily as needed for up to 15 doses for severe pain (pain). What changed:  reasons to take this   OXYGEN Inhale 2 L into the lungs as needed (shortness of breath).   predniSONE 20 MG tablet Commonly known as:  DELTASONE Take 1 tablet (20 mg total) by mouth daily with breakfast for 2 days.   SPIRIVA RESPIMAT 2.5 MCG/ACT Aers Generic drug:  Tiotropium Bromide Monohydrate Inhale 2 puffs into the lungs daily.   umeclidinium bromide 62.5 MCG/INH Aepb Commonly known as:  INCRUSE ELLIPTA Inhale 1 puff into the lungs daily.      Contact information for after-discharge care    Destination    [facility] .   Service:  Skilled Nursing Contact  information: [place] [address] [phone]             Allergies  Allergen Reactions  . Bactrim [Sulfamethoxazole-Trimethoprim] Hives    You were cared for by a hospitalist during your hospital stay. If you have any questions about your discharge medications or the care you received while you were in the hospital after you are discharged, you can call the unit and asked to speak with the hospitalist on call if the hospitalist that took care of you is not available. Once you are discharged, your primary care physician will handle any further medical issues. Please note that no refills for any discharge medications will be authorized once you are discharged, as it is imperative that you return to your primary care physician (or  establish a relationship with a primary care physician if you do not have one) for your aftercare needs so that they can reassess your need for medications and monitor your lab values.  Consultations:  PCCM   Procedures/Studies: Dg Chest Port 1 View  Result Date: 12/21/2017 CLINICAL DATA:  Shortness of breath EXAM: PORTABLE CHEST 1 VIEW COMPARISON:  12/20/2017 FINDINGS: Cardiac shadow remains enlarged. Endotracheal tube and nasogastric catheter have been removed in the interval. Mild right basilar atelectasis is noted. No pneumothorax is seen. No bony abnormality is noted. IMPRESSION: Mild right basilar atelectasis. Electronically Signed   By: Inez Catalina M.D.   On: 12/21/2017 07:08   Dg Chest Port 1 View  Result Date: 12/20/2017 CLINICAL DATA:  Endotracheal placement. EXAM: PORTABLE CHEST 1 VIEW COMPARISON:  12/18/2017 FINDINGS: Endotracheal tube tip is 4 cm above the carina. Nasogastric tube enters the stomach. Mild cardiomegaly as seen previously. The lungs are clear and well aerated. No edema. IMPRESSION: Endotracheal tube and nasogastric tube well positioned. Lungs clear. Electronically Signed   By: Nelson Chimes M.D.   On: 12/20/2017 07:49   Dg Chest Port 1 View  Result Date: 12/18/2017 CLINICAL DATA:  Endotracheally intubated. EXAM: PORTABLE CHEST 1 VIEW COMPARISON:  12/17/2017 FINDINGS: Endotracheal tube terminates midway between the clavicles and carina, unchanged. Enteric tube courses into the left upper abdomen with tip not imaged. The cardiomediastinal silhouette is unchanged. Mild bibasilar opacities has not significantly changed and are at least partially chronic based on radiographs from prior years. No overt edema, sizable pleural effusion, or pneumothorax is identified. IMPRESSION: 1. Unchanged and satisfactory position of endotracheal tube. 2. Unchanged mild bibasilar opacities which may reflect atelectasis and mild chronic lung changes. Electronically Signed   By: Logan Bores  M.D.   On: 12/18/2017 11:31   Dg Chest Port 1 View  Result Date: 12/17/2017 CLINICAL DATA:  Check endotracheal tube placement EXAM: PORTABLE CHEST 1 VIEW COMPARISON:  12/16/2017 FINDINGS: Endotracheal tube and nasogastric catheter are again noted and stable. The lungs are well aerated bilaterally. The previously seen basilar changes have improved in the interval. Cardiac shadow is stable. No bony abnormality is noted. IMPRESSION: Improving aeration in the bases bilaterally. Electronically Signed   By: Inez Catalina M.D.   On: 12/17/2017 10:14   Dg Chest Port 1 View  Result Date: 12/16/2017 CLINICAL DATA:  Endotracheal placement. EXAM: PORTABLE CHEST 1 VIEW COMPARISON:  12/15/2017 FINDINGS: Endotracheal tube tip is 5 cm above the carina. There is infiltrate in both lower lobes, right more than left, consistent with basilar pneumonia. Upper lungs are clear. No evidence of heart failure or effusion. IMPRESSION: Endotracheal tube tip 5 cm above the carina. Bilateral lower lobe  infiltrate right more than left. Electronically Signed   By: Nelson Chimes M.D.   On: 12/16/2017 10:28   Dg Chest Port 1 View  Result Date: 12/15/2017 CLINICAL DATA:  Endotracheal intubation EXAM: PORTABLE CHEST 1 VIEW COMPARISON:  Chest radiograph 12/15/2017 at 9:23 a.m. FINDINGS: Endotracheal tube tip is below the level of the clavicular heads approximately 3.2 cm above the carina. Orogastric tube courses below the diaphragm. Mediastinal silhouette and lungs are unchanged. Persistent bibasilar atelectasis. IMPRESSION: Endotracheal tube tip approximately 3.2 cm above the carina. Electronically Signed   By: Ulyses Jarred M.D.   On: 12/15/2017 15:17   Dg Chest Port 1 View  Result Date: 12/15/2017 CLINICAL DATA:  Onset of shortness of breath and acute respiratory distress today. History of COPD, CHF, HIV, current smoker. EXAM: PORTABLE CHEST 1 VIEW COMPARISON:  Chest x-ray of Dec 14, 2017 FINDINGS: The lungs remain mildly hyperinflated. The  interstitial markings are coarse especially at the bases but have improved slightly since yesterday's study. The cardiac silhouette is enlarged. The central pulmonary vascularity is prominent. There is no pleural effusion. IMPRESSION: Slight interval improvement in the appearance of bibasilar atelectasis or pneumonia. Mild cardiomegaly with central pulmonary vascular congestion. Electronically Signed   By: David  Martinique M.D.   On: 12/15/2017 11:15   Dg Chest Port 1 View  Result Date: 12/14/2017 CLINICAL DATA:  SOB. Hx COPD with chronic home o2 use. Hx CHF, DM, HTN. EXAM: PORTABLE CHEST 1 VIEW COMPARISON:  10/20/2017 FINDINGS: Cardiac silhouette is mildly enlarged.  No mediastinal or masses. There is prominence of the bronchovascular markings lung base interstitial thickening, similar to the prior exam. No convincing pneumonia or pulmonary edema. No pleural effusion or pneumothorax. A possible right internal jugular central venous line not well-defined on this image. The possible tip projects in the region of the upper superior vena cava. Skeletal structures are grossly intact. IMPRESSION: No acute cardiopulmonary disease. Electronically Signed   By: Lajean Manes M.D.   On: 12/14/2017 15:58   Dg Abd Portable 1v  Result Date: 12/16/2017 CLINICAL DATA:  Orogastric placement. EXAM: PORTABLE ABDOMEN - 1 VIEW COMPARISON:  10/27/2017 FINDINGS: Orogastric tube enters the stomach in has its tip in the antral region. Abdominal gas pattern is unremarkable. IMPRESSION: Orogastric tube tip in the gastric antrum region. Electronically Signed   By: Nelson Chimes M.D.   On: 12/16/2017 10:28     Subjective: No complaints.   HEENT/EYES = negative for pain, redness, loss of vision, double vision, blurred vision, loss of hearing, sore throat, hoarseness, dysphagia Cardiovascular= negative for chest pain, palpitation, murmurs, lower extremity swelling Respiratory/lungs= negative for shortness of breath, cough, hemoptysis,  wheezing, mucus production Gastrointestinal= negative for nausea, vomiting,, abdominal pain, melena, hematemesis Genitourinary= negative for Dysuria, Hematuria, Change in Urinary Frequency MSK = Negative for arthralgia, myalgias, Back Pain, Joint swelling  Neurology= Negative for headache, seizures, numbness, tingling  Psychiatry= Negative for anxiety, depression, suicidal and homocidal ideation Allergy/Immunology= Medication/Food allergy as listed  Skin= Negative for Rash, lesions, ulcers, itching   Discharge Exam: Vitals:   12/26/17 0815 12/26/17 0854  BP:  117/67  Pulse:  (!) 103  Resp:  (!) 22  Temp:  98.4 F (36.9 C)  SpO2: 95% 97%   Vitals:   12/26/17 0500 12/26/17 0634 12/26/17 0815 12/26/17 0854  BP:  133/70  117/67  Pulse:    (!) 103  Resp:    (!) 22  Temp:    98.4 F (36.9 C)  TempSrc:  Oral  SpO2:   95% 97%  Weight: 103.2 kg (227 lb 8.2 oz)     Height:        General: Pt is alert, awake, not in acute distress Cardiovascular: RRR, S1/S2 +, no rubs, no gallops Respiratory: Diminished BS at the bases Abdominal: Soft, NT, ND, bowel sounds + Extremities: no edema, no cyanosis Overall weak appearing.     The results of significant diagnostics from this hospitalization (including imaging, microbiology, ancillary and laboratory) are listed below for reference.     Microbiology: No results found for this or any previous visit (from the past 240 hour(s)).   Labs: BNP (last 3 results) Recent Labs    12/14/17 1440 12/22/17 0839 12/24/17 0324  BNP 73.6 109.0* 16.9   Basic Metabolic Panel: Recent Labs  Lab 12/20/17 0608  12/21/17 0411 12/22/17 0448 12/23/17 0226 12/24/17 0324 12/25/17 0303 12/26/17 0344  NA  --    < > 146* 138 138 142 142 142  K  --    < > 5.2* 4.1 3.5 3.7 3.5 3.7  CL  --    < > 107 100* 98* 102 102 102  CO2  --    < > '30 26 30 31 31 31  ' GLUCOSE  --    < > 203* 225* 190* 174* 162* 208*  BUN  --    < > 72* 63* 47* 35* 27* 31*   CREATININE  --    < > 1.34* 1.34* 1.57* 1.27* 1.21 1.36*  CALCIUM  --    < > 9.7 9.4 9.1 9.1 8.9 8.7*  MG 2.8*  --  2.4 2.1 2.1 2.1 1.9 2.0  PHOS 4.3  --  4.8* 3.2  --   --   --   --    < > = values in this interval not displayed.   Liver Function Tests: No results for input(s): AST, ALT, ALKPHOS, BILITOT, PROT, ALBUMIN in the last 168 hours. No results for input(s): LIPASE, AMYLASE in the last 168 hours. No results for input(s): AMMONIA in the last 168 hours. CBC: Recent Labs  Lab 12/20/17 0608 12/21/17 0411 12/22/17 0448 12/24/17 0324 12/25/17 0303 12/26/17 0344  WBC 13.9* 13.4* 14.1* 14.4* 14.6* 15.3*  NEUTROABS 11.4* 11.2* 10.8*  --   --   --   HGB 11.1* 11.8* 12.0* 10.3* 10.1* 9.5*  HCT 39.4 40.4 39.5 34.8* 34.1* 32.2*  MCV 102.3* 99.5 95.0 95.6 96.6 95.3  PLT 230 200 197 173 200 257   Cardiac Enzymes: No results for input(s): CKTOTAL, CKMB, CKMBINDEX, TROPONINI in the last 168 hours. BNP: Invalid input(s): POCBNP CBG: Recent Labs  Lab 12/25/17 1153 12/25/17 1650 12/25/17 2149 12/26/17 0736 12/26/17 1210  GLUCAP 155* 369* 287* 240* 271*   D-Dimer No results for input(s): DDIMER in the last 72 hours. Hgb A1c No results for input(s): HGBA1C in the last 72 hours. Lipid Profile No results for input(s): CHOL, HDL, LDLCALC, TRIG, CHOLHDL, LDLDIRECT in the last 72 hours. Thyroid function studies No results for input(s): TSH, T4TOTAL, T3FREE, THYROIDAB in the last 72 hours.  Invalid input(s): FREET3 Anemia work up No results for input(s): VITAMINB12, FOLATE, FERRITIN, TIBC, IRON, RETICCTPCT in the last 72 hours. Urinalysis    Component Value Date/Time   COLORURINE AMBER (A) 10/14/2017 0844   APPEARANCEUR CLOUDY (A) 10/14/2017 0844   LABSPEC 1.015 10/14/2017 0844   PHURINE 5.0 10/14/2017 0844   GLUCOSEU 50 (A) 10/14/2017 0844   HGBUR LARGE (A) 10/14/2017 6789  [place] NEGATIVE 10/14/2017 0844   BILIRUBINUR neg 11/12/2016 0946   KETONESUR NEGATIVE  10/14/2017 0844   PROTEINUR 100 (A) 10/14/2017 0844   UROBILINOGEN 1.0 11/12/2016 0946   UROBILINOGEN 1 09/21/2014 1205   NITRITE NEGATIVE 10/14/2017 0844   LEUKOCYTESUR NEGATIVE 10/14/2017 0844   Sepsis Labs Invalid input(s): PROCALCITONIN,  WBC,  LACTICIDVEN Microbiology No results found for this or any previous visit (from the past 240 hour(s)).   Time coordinating discharge:  I have spent 35 minutes face to face with the patient and on the [place] discussing the patients care, assessment, plan and disposition with other care givers. >50% of the time was devoted counseling the patient about the risks and benefits of treatment/Discharge disposition and coordinating care.   SIGNED:   Damita Lack, MD  [facility] 12/26/2017, 12:21 PM Pager   If 7PM-7AM, please contact night-coverage [email or web address] Password [email or web address]

## 2017-12-26 NOTE — Progress Notes (Signed)
CSW continuing to follow for discharge needs.  Landri Dorsainvil LCSW [phone]  

## 2017-12-27 LAB — CBC
HEMATOCRIT: 31.3 % — AB (ref 39.0–52.0)
Hemoglobin: 9.2 g/dL — ABNORMAL LOW (ref 13.0–17.0)
MCH: 29 pg (ref 26.0–34.0)
MCHC: 29.4 g/dL — ABNORMAL LOW (ref 30.0–36.0)
MCV: 98.7 fL (ref 78.0–100.0)
PLATELETS: 273 10*3/uL (ref 150–400)
RBC: 3.17 MIL/uL — AB (ref 4.22–5.81)
RDW: 14.2 % (ref 11.5–15.5)
WBC: 15.9 10*3/uL — AB (ref 4.0–10.5)

## 2017-12-27 LAB — GLUCOSE, CAPILLARY
GLUCOSE-CAPILLARY: 230 mg/dL — AB (ref 65–99)
Glucose-Capillary: 202 mg/dL — ABNORMAL HIGH (ref 65–99)
Glucose-Capillary: 257 mg/dL — ABNORMAL HIGH (ref 65–99)
Glucose-Capillary: 284 mg/dL — ABNORMAL HIGH (ref 65–99)

## 2017-12-27 LAB — BASIC METABOLIC PANEL
ANION GAP: 9 (ref 5–15)
BUN: 27 mg/dL — ABNORMAL HIGH (ref 6–20)
CALCIUM: 8.7 mg/dL — AB (ref 8.9–10.3)
CHLORIDE: 105 mmol/L (ref 101–111)
CO2: 28 mmol/L (ref 22–32)
Creatinine, Ser: 1.26 mg/dL — ABNORMAL HIGH (ref 0.61–1.24)
GFR calc Af Amer: >60 mL/min (ref >60)
GFR calc non Af Amer: >60 mL/min (ref >60)
GLUCOSE: 228 mg/dL — AB (ref 65–99)
Potassium: 4.6 mmol/L (ref 3.5–5.1)
Sodium: 142 mmol/L (ref 135–145)

## 2017-12-27 LAB — MAGNESIUM: Magnesium: 2.1 mg/dL (ref 1.7–2.4)

## 2017-12-27 MED ORDER — IPRATROPIUM BROMIDE 0.02 % IN SOLN
0.5000 mg | Freq: Two times a day (BID) | RESPIRATORY_TRACT | Status: DC
  Administered 2017-12-27 – 2017-12-28 (×2): 0.5 mg via RESPIRATORY_TRACT
  Filled 2017-12-27 (×2): qty 2.5

## 2017-12-27 MED ORDER — LEVALBUTEROL HCL 0.63 MG/3ML IN NEBU
0.6300 mg | INHALATION_SOLUTION | Freq: Two times a day (BID) | RESPIRATORY_TRACT | Status: DC
  Administered 2017-12-27 – 2017-12-28 (×2): 0.63 mg via RESPIRATORY_TRACT
  Filled 2017-12-27 (×2): qty 3

## 2017-12-27 NOTE — Discharge Instructions (Signed)

## 2017-12-27 NOTE — Social Work (Signed)
Continue to await insurance authorization for discharge to SNF.   Doy Hutching, LCSWA [facility] Clinical Social Work [phone]

## 2017-12-27 NOTE — Progress Notes (Signed)
Patient refuses BiPAP.

## 2017-12-27 NOTE — Progress Notes (Signed)
PROGRESS NOTE    Chris [name].  WUJ:[id number] DOB: [birth year] DOA: 12/14/2017 PCP: Claiborne Rigg, NP   Brief Narrative:  52 year old African-American male with history of COPD requiring as needed home oxygen, essential hypertension, chronic diastolic CHF ejection fraction 60%, positive, diabetes mellitus type 2, ongoing tobacco use, paroxysmal atrial fibrillation on Eliquis, recent influenza induced ARDS requiring tracheostomy intubation and few episodes of dialysis was admitted to the hospital for acute on chronic hypoxic respiratory failure due to COPD exacerbation.  He was initially taken to the ICU as he required BiPAP.  Over the course of several days his oxygen was weaned off and transition to medical service.   Assessment & Plan:   Principal Problem:   COPD exacerbation (HCC) Active Problems:   Obstructive sleep apnea syndrome   Essential hypertension, benign   Cigarette nicotine dependence without complication   GERD (gastroesophageal reflux disease)   Acute on chronic respiratory failure with hypoxia (HCC)   PAF (paroxysmal atrial fibrillation) (HCC)   Diabetes mellitus type 2 in obese (HCC)   HIV (human immunodeficiency virus infection) (HCC)   Acute on chronic hypoxic respiratory failure; improved Acute moderate to severe exacerbation- advanced persistent COPD; slowly improving.  - Continue patient on prednisone taper.  Currently on Xopenex scheduled and as necessary.  Continue Dulera and Atrovent -Supplemental oxygen has been weaned down to 2 L nasal cannula. -Currently awaiting bed at skilled nursing facility.  History of atrial fibrillation stable -Continue Eliquis 5 mg twice daily, Cardizem 240 mg daily  Chronic diastolic CHF, ejection fraction 60% -Appears euvolemic and well compensated.  Continue home medications  Diabetes mellitus type 2 Peripheral neuropathy -Gabapentin 3 times daily 300 mg -Continue home diabetic medications  Essential  hypertension - Patient is on Cardizem, Imdur and hydralazine has been added here  Oral Candida -On oral Diflucan until Jan 02, 2018  Morbid obesity with obstructive sleep apnea -Use CPAP at bedtime.  Follow-up outpatient with primary care physician  Due to generalized weakness physical therapy is recommended patient will benefit from skilled nursing facility.  Arrangements are to be made.  Per Child psychotherapist we are waiting on insurance authorization  DVT prophylaxis: Eliquis Code Status: Full code Family Communication:   No family is at the bedside Disposition Plan: Patient is stable to be discharged, pending insurance authorization  Consultants:   None  Procedures:   None  Antimicrobials:   On Diflucan until Jan 02, 2018   Subjective: No acute events overnight.  No other complaints this morning.  Review of Systems Otherwise negative except as per HPI, including: HEENT/EYES = negative for pain, redness, loss of vision, double vision, blurred vision, loss of hearing, sore throat, hoarseness, dysphagia Cardiovascular= negative for chest pain, palpitation, murmurs, lower extremity swelling Respiratory/lungs= negative for shortness of breath, cough, hemoptysis, wheezing, mucus production Gastrointestinal= negative for nausea, vomiting,, abdominal pain, melena, hematemesis Genitourinary= negative for Dysuria, Hematuria, Change in Urinary Frequency MSK = Negative for arthralgia, myalgias, Back Pain, Joint swelling  Neurology= Negative for headache, seizures, numbness, tingling  Psychiatry= Negative for anxiety, depression, suicidal and homocidal ideation Allergy/Immunology= Medication/Food allergy as listed  Skin= Negative for Rash, lesions, ulcers, itching     Objective: Vitals:   12/26/17 2157 12/27/17 0143 12/27/17 0252 12/27/17 0826  BP: 120/66   128/74  Pulse: (!) 103   98  Resp: (!) 22     Temp: 98.1 F (36.7 C)   98.2 F (36.8 C)  TempSrc: Oral   Oral  SpO2:  91% 92%  95%  Weight:   103.3 kg (227 lb 11.8 oz)   Height:        Intake/Output Summary (Last 24 hours) at 12/27/2017 1018 Last data filed at 12/26/2017 1900 Gross per 24 hour  Intake 600 ml  Output 900 ml  Net -300 ml   Filed Weights   12/25/17 0224 12/26/17 0500 12/27/17 0252  Weight: 105.1 kg (231 lb 11.3 oz) 103.2 kg (227 lb 8.2 oz) 103.3 kg (227 lb 11.8 oz)    Examination: Constitutional: NAD, calm, comfortable Eyes: PERRL, lids and conjunctivae normal ENMT: Mucous membranes are moist. Posterior pharynx clear of any exudate or lesions.Normal dentition.  Neck: normal, supple, no masses, no thyromegaly Respiratory: Lightly diminished breath sounds at the bases with very minimal wheezing Cardiovascular: Regular rate and rhythm, no murmurs / rubs / gallops. No extremity edema. 2+ pedal pulses. No carotid bruits.  Abdomen: no tenderness, no masses palpated. No hepatosplenomegaly. Bowel sounds positive.  Musculoskeletal: no clubbing / cyanosis. No joint deformity upper and lower extremities. Good ROM, no contractures. Normal muscle tone.  Skin: no rashes, lesions, ulcers. No induration Neurologic: CN 2-12 grossly intact. Sensation intact, DTR normal. Strength 4/5 in all 4.  Psychiatric: Normal judgment and insight. Alert and oriented x 3. Normal mood.   Data Reviewed:   CBC: Recent Labs  Lab 12/21/17 0411 12/22/17 0448 12/24/17 0324 12/25/17 0303 12/26/17 0344 12/27/17 0244  WBC 13.4* 14.1* 14.4* 14.6* 15.3* 15.9*  NEUTROABS 11.2* 10.8*  --   --   --   --   HGB 11.8* 12.0* 10.3* 10.1* 9.5* 9.2*  HCT 40.4 39.5 34.8* 34.1* 32.2* 31.3*  MCV 99.5 95.0 95.6 96.6 95.3 98.7  PLT 200 197 173 200 257 273   Basic Metabolic Panel: Recent Labs  Lab 12/21/17 0411 12/22/17 0448 12/23/17 0226 12/24/17 0324 12/25/17 0303 12/26/17 0344 12/27/17 0244  NA 146* 138 138 142 142 142 142  K 5.2* 4.1 3.5 3.7 3.5 3.7 4.6  CL 107 100* 98* 102 102 102 105  CO2 GLUCOSE 203* 225* 190* 174* 162* 208* 228*  BUN 72* 63* 47* 35* 27* 31* 27*  CREATININE 1.34* 1.34* 1.57* 1.27* 1.21 1.36* 1.26*  CALCIUM 9.7 9.4 9.1 9.1 8.9 8.7* 8.7*  MG 2.4 2.1 2.1 2.1 1.9 2.0 2.1  PHOS 4.8* 3.2  --   --   --   --   --    GFR: Estimated Creatinine Clearance: 82.1 mL/min (A) (by C-G formula based on SCr of 1.26 mg/dL (H)). Liver Function Tests: No results for input(s): AST, ALT, ALKPHOS, BILITOT, PROT, ALBUMIN in the last 168 hours. No results for input(s): LIPASE, AMYLASE in the last 168 hours. No results for input(s): AMMONIA in the last 168 hours. Coagulation Profile: No results for input(s): INR, PROTIME in the last 168 hours. Cardiac Enzymes: No results for input(s): CKTOTAL, CKMB, CKMBINDEX, TROPONINI in the last 168 hours. BNP (last 3 results) No results for input(s): PROBNP in the last 8760 hours. HbA1C: No results for input(s): HGBA1C in the last 72 hours. CBG: Recent Labs  Lab 12/26/17 0736 12/26/17 1210 12/26/17 1715 12/26/17 2155 12/27/17 0721  GLUCAP 240* 271* 307* 356* 202*   Lipid Profile: No results for input(s): CHOL, HDL, LDLCALC, TRIG, CHOLHDL, LDLDIRECT in the last 72 hours. Thyroid Function Tests: No results for input(s): TSH, T4TOTAL, FREET4, T3FREE, THYROIDAB in the last 72 hours. Anemia Panel: No results for input(s): VITAMINB12,  FOLATE, FERRITIN, TIBC, IRON, RETICCTPCT in the last 72 hours. Sepsis Labs: No results for input(s): PROCALCITON, LATICACIDVEN in the last 168 hours.  No results found for this or any previous visit (from the past 240 hour(s)).    Radiology Studies: No results found.  Scheduled Meds: . amitriptyline  100 mg Oral QHS  . apixaban  5 mg Per Tube BID  . bictegravir-emtricitabine-tenofovir AF  1 tablet Oral Daily  . dapsone  100 mg Oral Daily  . diltiazem  240 mg Oral Daily  . docusate  100 mg Per Tube BID  . fluconazole  100 mg Oral Daily  . gabapentin  300 mg Oral TID  . hydrALAZINE  50 mg Oral  Q8H  . insulin aspart  0-15 Units Subcutaneous TID WC  . insulin aspart  0-5 Units Subcutaneous QHS  . ipratropium  0.5 mg Nebulization TID  . isosorbide mononitrate  30 mg Oral Daily  . levalbuterol  0.63 mg Nebulization Q6H  . mouth rinse  15 mL Mouth Rinse BID  . mometasone-formoterol  2 puff Inhalation BID  . nicotine  21 mg Transdermal Daily  . sodium chloride flush  3 mL Intravenous Q12H   Continuous Infusions: . sodium chloride Stopped (12/19/17 1443)     LOS: 13 days   I have spent 20 minutes face to face with the patient and on the [place] discussing the patients care, assessment, plan and disposition with other care givers. >50% of the time was devoted counseling the patient about the risks and benefits of treatment and coordinating care.   Nolah Krenzer Joline Maxcy, MD [facility] Pager [phone]   If 7PM-7AM, please contact night-coverage [email or web address] Password [email or web address] 12/27/2017, 10:18 AM

## 2017-12-28 LAB — BASIC METABOLIC PANEL
Anion gap: 9 (ref 5–15)
BUN: 22 mg/dL — ABNORMAL HIGH (ref 6–20)
CALCIUM: 8.8 mg/dL — AB (ref 8.9–10.3)
CO2: 30 mmol/L (ref 22–32)
CREATININE: 1.12 mg/dL (ref 0.61–1.24)
Chloride: 100 mmol/L — ABNORMAL LOW (ref 101–111)
GFR calc Af Amer: >60 mL/min (ref >60)
Glucose, Bld: 183 mg/dL — ABNORMAL HIGH (ref 65–99)
POTASSIUM: 4.3 mmol/L (ref 3.5–5.1)
SODIUM: 139 mmol/L (ref 135–145)

## 2017-12-28 LAB — MAGNESIUM: MAGNESIUM: 1.9 mg/dL (ref 1.7–2.4)

## 2017-12-28 LAB — CBC
HCT: 29.1 % — ABNORMAL LOW (ref 39.0–52.0)
HEMOGLOBIN: 8.5 g/dL — AB (ref 13.0–17.0)
MCH: 28.4 pg (ref 26.0–34.0)
MCHC: 29.2 g/dL — AB (ref 30.0–36.0)
MCV: 97.3 fL (ref 78.0–100.0)
PLATELETS: 300 10*3/uL (ref 150–400)
RBC: 2.99 MIL/uL — ABNORMAL LOW (ref 4.22–5.81)
RDW: 14.2 % (ref 11.5–15.5)
WBC: 17 10*3/uL — ABNORMAL HIGH (ref 4.0–10.5)

## 2017-12-28 LAB — GLUCOSE, CAPILLARY
GLUCOSE-CAPILLARY: 206 mg/dL — AB (ref 65–99)
Glucose-Capillary: 223 mg/dL — ABNORMAL HIGH (ref 65–99)
Glucose-Capillary: 255 mg/dL — ABNORMAL HIGH (ref 65–99)
Glucose-Capillary: 201 mg/dL — ABNORMAL HIGH (ref 65–99)

## 2017-12-28 MED ORDER — INSULIN DETEMIR 100 UNIT/ML ~~LOC~~ SOLN
10.0000 [IU] | Freq: Every day | SUBCUTANEOUS | Status: DC
  Administered 2017-12-28 – 2017-12-29 (×2): 10 [IU] via SUBCUTANEOUS
  Filled 2017-12-28 (×2): qty 0.1

## 2017-12-28 NOTE — Progress Notes (Signed)
Physical Therapy Treatment Patient Details Name: Chris [name]. MRN: [id number] DOB: 1966-04-19 Today's Date: 12/28/2017    History of Present Illness 52 year old male patient with history of chronic respiratory failure in the setting of stage III chronic obstructive pulmonary disease and ongoing tobacco abuse.  Recently discharged after a prolonged hospitalization due to influenza pneumonia, resulting in ARDS, prolonged ventilator dependence, and ultimately tracheostomy as well as dialysis requirement.  Being treated for acute on chronic hypoxic and hypercarbic respiratory failure in the setting of acute exacerbation of chronic obstructive pulmonary disease and what appears to be significant component of laryngal pharyngeal reflux disease as evidenced by marked upper airway wheezing.     PT Comments    Pt remains weak and deconditioned, unable to stand long before both legs give out on him.  Second person helpful for transfers to ensure pt make it to the chair before his legs give out.  Seated and semi seated HEP practiced.  He would benefit from more times per week if able.  PT will continue to follow acutely.    Follow Up Recommendations  SNF     Equipment Recommendations  None recommended by PT    Recommendations for Other Services   NA     Precautions / Restrictions Precautions Precautions: Fall Precaution Comments: legs give out unexpectedly    Mobility  Bed Mobility Overal bed mobility: Needs Assistance Bed Mobility: Rolling;Supine to Sit Rolling: Modified independent (Device/Increase time)   Supine to sit: Min assist;HOB elevated     General bed mobility comments: Pt able to roll easily with use of bedrail, supine to sit needs min assist at trunk with use of bed rail and elevated HOB ~45.   Transfers Overall transfer level: Needs assistance Equipment used: Rolling walker (2 wheeled) Transfers: Sit to/from Stand Sit to Stand: Min assist;+2 physical assistance          General transfer comment: Stood twice min assist with RW, pt was unable to stand very long and sat quickly.  Ambulation/Gait             General Gait Details: unable, too weak to attempt.           Balance Overall balance assessment: Needs assistance Sitting-balance support: Feet supported;Bilateral upper extremity supported Sitting balance-Leahy Scale: Fair     Standing balance support: Bilateral upper extremity supported Standing balance-Leahy Scale: Poor Standing balance comment: needs support of RW and therapist, unable to stand for >30 seconds before legs give out.                             Cognition Arousal/Alertness: Awake/alert Behavior During Therapy: WFL for tasks assessed/performed Overall Cognitive Status: No family/caregiver present to determine baseline cognitive functioning                                 General Comments: Unsure of his baseline, mumbles, not clear at times.       Exercises General Exercises - Upper Extremity Shoulder ABduction: Theraband;AROM;Both;10 reps Theraband Level (Shoulder Abduction): Level 1 (Yellow) Elbow Flexion: AROM;Both;10 reps;Theraband Theraband Level (Elbow Flexion): Level 1 (Yellow) General Exercises - Lower Extremity Long Arc Quad: AROM;Both;10 reps Hip Flexion/Marching: AROM;Both;10 reps Toe Raises: AROM;Both;10 reps Heel Raises: AROM;Both;10 reps Mini-Sqauts: AROM;Both;10 reps(chair push ups)        Pertinent Vitals/Pain Pain Assessment: Faces Faces Pain Scale: Hurts little more Pain  Location: back Pain Descriptors / Indicators: Aching Pain Intervention(s): Limited activity within patient's tolerance;Monitored during session;Repositioned           PT Goals (current goals can now be found in the care plan section) Acute Rehab PT Goals Patient Stated Goal: be able to stand up by himself again Progress towards PT goals: Progressing toward goals    Frequency     Min 2X/week      PT Plan Current plan remains appropriate       AM-PAC PT "6 Clicks" Daily Activity  Outcome Measure  Difficulty turning over in bed (including adjusting bedclothes, sheets and blankets)?: None Difficulty moving from lying on back to sitting on the side of the bed? : Unable Difficulty sitting down on and standing up from a chair with arms (e.g., wheelchair, bedside commode, etc,.)?: Unable Help needed moving to and from a bed to chair (including a wheelchair)?: A Lot Help needed walking in hospital room?: Total Help needed climbing 3-5 steps with a railing? : Total 6 Click Score: 10    End of Session Equipment Utilized During Treatment: Gait belt;Oxygen Activity Tolerance: Patient limited by fatigue Patient left: in chair;with call bell/phone within reach;with chair alarm set   PT Visit Diagnosis: Unsteadiness on feet (R26.81);Other abnormalities of gait and mobility (R26.89);Muscle weakness (generalized) (M62.81);Difficulty in walking, not elsewhere classified (R26.2)     Time: 8469-6295 PT Time Calculation (min) (ACUTE ONLY): 25 min  Charges:  $Therapeutic Exercise: 8-22 mins $Therapeutic Activity: 8-22 mins          Kaston Faughn B. Vielka Klinedinst, PT, DPT [phone]            12/28/2017, 10:35 AM

## 2017-12-28 NOTE — Progress Notes (Signed)
PROGRESS NOTE    Chris [name].  WUJ:[id number] DOB: 02-22-1966 DOA: 12/14/2017 PCP: Claiborne Rigg, NP   Brief Narrative:  52 year old African-American male with history of COPD requiring as needed home oxygen, essential hypertension, chronic diastolic CHF ejection fraction 60%, positive, diabetes mellitus type 2, ongoing tobacco use, paroxysmal atrial fibrillation on Eliquis, recent influenza induced ARDS requiring tracheostomy intubation and few episodes of dialysis was admitted to the hospital for acute on chronic hypoxic respiratory failure due to COPD exacerbation.  He was initially taken to the ICU as he required BiPAP.  Over the course of several days his oxygen was weaned off and transition to medical service.   Assessment & Plan:   Principal Problem:   COPD exacerbation (HCC) Active Problems:   Obstructive sleep apnea syndrome   Essential hypertension, benign   Cigarette nicotine dependence without complication   GERD (gastroesophageal reflux disease)   Acute on chronic respiratory failure with hypoxia (HCC)   PAF (paroxysmal atrial fibrillation) (HCC)   Diabetes mellitus type 2 in obese (HCC)   HIV (human immunodeficiency virus infection) (HCC)   Acute on chronic hypoxic respiratory failure; stable Acute moderate to severe exacerbation- advanced persistent COPD; stable - Continue patient on prednisone taper.  Currently on Xopenex scheduled and as necessary.  Continue Dulera and Atrovent -His oxygen has been weaned down to 2 L nasal cannula which is his baseline. -Currently awaiting bed at the skilled nursing facility  History of atrial fibrillation stable -Continue Eliquis 5 mg twice daily and Cardizem 240 mg daily  Chronic diastolic CHF, ejection fraction 60% -Appears euvolemic and well compensated.  Continue home medications  Diabetes mellitus type 2 Peripheral neuropathy -Gabapentin 3 times daily 300 mg -We will put patient on Lantus 10 units daily due to  hyperglycemia.  But can resume his home regimen at the time of discharge  Essential hypertension - Patient is on Cardizem, Imdur and hydralazine has been added here  Oral Candida -On oral Diflucan until Jan 02, 2018  Morbid obesity with obstructive sleep apnea -Use CPAP at bedtime.  Follow-up outpatient with primary care physician  Due to generalized weakness physical therapy had evaluated the patient who recommended skilled nursing facility.  DVT prophylaxis: Eliquis Code Status: Full code Family Communication:   No family is at the bedside Disposition Plan: Pending placement to skilled nursing facility  Consultants:   None  Procedures:   None  Antimicrobials:   On Diflucan until Jan 02, 2018   Subjective: No acute events overnight.  Review of Systems Otherwise negative except as per HPI, including: HEENT/EYES = negative for pain, redness, loss of vision, double vision, blurred vision, loss of hearing, sore throat, hoarseness, dysphagia Cardiovascular= negative for chest pain, palpitation, murmurs, lower extremity swelling Respiratory/lungs= negative for shortness of breath, cough, hemoptysis, wheezing, mucus production Gastrointestinal= negative for nausea, vomiting,, abdominal pain, melena, hematemesis Genitourinary= negative for Dysuria, Hematuria, Change in Urinary Frequency MSK = Negative for arthralgia, myalgias, Back Pain, Joint swelling  Neurology= Negative for headache, seizures, numbness, tingling  Psychiatry= Negative for anxiety, depression, suicidal and homocidal ideation Allergy/Immunology= Medication/Food allergy as listed  Skin= Negative for Rash, lesions, ulcers, itching   Objective: Vitals:   12/28/17 0300 12/28/17 0546 12/28/17 0750 12/28/17 0859  BP:  121/68 125/72   Pulse:  98 89   Resp:   (!) 21   Temp:   98.3 F (36.8 C)   TempSrc:   Oral   SpO2:  96% 95% 95%  Weight: 100.9  kg (222 lb 7.1 oz)     Height:        Intake/Output  Summary (Last 24 hours) at 12/28/2017 1103 Last data filed at 12/28/2017 0500 Gross per 24 hour  Intake 360 ml  Output 2750 ml  Net -2390 ml   Filed Weights   12/26/17 0500 12/27/17 0252 12/28/17 0300  Weight: 103.2 kg (227 lb 8.2 oz) 103.3 kg (227 lb 11.8 oz) 100.9 kg (222 lb 7.1 oz)    Examination: Constitutional: NAD, calm, comfortable Eyes: PERRL, lids and conjunctivae normal ENMT: Mucous membranes are moist. Posterior pharynx clear of any exudate or lesions.poor dentition Neck: normal, supple, no masses, no thyromegaly Respiratory: Slightly diminished breath sounds at the bases Cardiovascular: Regular rate and rhythm, no murmurs / rubs / gallops. No extremity edema. 2+ pedal pulses. No carotid bruits.  Abdomen: no tenderness, no masses palpated. No hepatosplenomegaly. Bowel sounds positive.  Musculoskeletal: no clubbing / cyanosis. No joint deformity upper and lower extremities. Good ROM, no contractures. Normal muscle tone.  Skin: no rashes, lesions, ulcers. No induration Neurologic: CN 2-12 grossly intact. Sensation intact, DTR normal. Strength 4/5 in all 4.  Psychiatric: Normal judgment and insight. Alert and oriented x 3. Normal mood.   Data Reviewed:   CBC: Recent Labs  Lab 12/22/17 0448 12/24/17 0324 12/25/17 0303 12/26/17 0344 12/27/17 0244 12/28/17 0357  WBC 14.1* 14.4* 14.6* 15.3* 15.9* 17.0*  NEUTROABS 10.8*  --   --   --   --   --   HGB 12.0* 10.3* 10.1* 9.5* 9.2* 8.5*  HCT 39.5 34.8* 34.1* 32.2* 31.3* 29.1*  MCV 95.0 95.6 96.6 95.3 98.7 97.3  PLT 197 173 200 257 273 300   Basic Metabolic Panel: Recent Labs  Lab 12/22/17 0448  12/24/17 0324 12/25/17 0303 12/26/17 0344 12/27/17 0244 12/28/17 0357  NA 138   < > 142 142 142 142 139  K 4.1   < > 3.7 3.5 3.7 4.6 4.3  CL 100*   < > 102 102 102 105 100*  CO2 26   < > GLUCOSE 225*   < > 174* 162* 208* 228* 183*  BUN 63*   < > 35* 27* 31* 27* 22*  CREATININE 1.34*   < > 1.27* 1.21 1.36*  1.26* 1.12  CALCIUM 9.4   < > 9.1 8.9 8.7* 8.7* 8.8*  MG 2.1   < > 2.1 1.9 2.0 2.1 1.9  PHOS 3.2  --   --   --   --   --   --    < > = values in this interval not displayed.   GFR: Estimated Creatinine Clearance: 91.4 mL/min (by C-G formula based on SCr of 1.12 mg/dL). Liver Function Tests: No results for input(s): AST, ALT, ALKPHOS, BILITOT, PROT, ALBUMIN in the last 168 hours. No results for input(s): LIPASE, AMYLASE in the last 168 hours. No results for input(s): AMMONIA in the last 168 hours. Coagulation Profile: No results for input(s): INR, PROTIME in the last 168 hours. Cardiac Enzymes: No results for input(s): CKTOTAL, CKMB, CKMBINDEX, TROPONINI in the last 168 hours. BNP (last 3 results) No results for input(s): PROBNP in the last 8760 hours. HbA1C: No results for input(s): HGBA1C in the last 72 hours. CBG: Recent Labs  Lab 12/27/17 0721 12/27/17 1127 12/27/17 1654 12/27/17 2145 12/28/17 0647  GLUCAP 202* 257* 230* 284* 223*   Lipid Profile: No results for input(s): CHOL, HDL, LDLCALC, TRIG, CHOLHDL, LDLDIRECT in  the last 72 hours. Thyroid Function Tests: No results for input(s): TSH, T4TOTAL, FREET4, T3FREE, THYROIDAB in the last 72 hours. Anemia Panel: No results for input(s): VITAMINB12, FOLATE, FERRITIN, TIBC, IRON, RETICCTPCT in the last 72 hours. Sepsis Labs: No results for input(s): PROCALCITON, LATICACIDVEN in the last 168 hours.  No results found for this or any previous visit (from the past 240 hour(s)).    Radiology Studies: No results found.  Scheduled Meds: . amitriptyline  100 mg Oral QHS  . apixaban  5 mg Per Tube BID  . bictegravir-emtricitabine-tenofovir AF  1 tablet Oral Daily  . dapsone  100 mg Oral Daily  . diltiazem  240 mg Oral Daily  . docusate  100 mg Per Tube BID  . fluconazole  100 mg Oral Daily  . gabapentin  300 mg Oral TID  . hydrALAZINE  50 mg Oral Q8H  . insulin aspart  0-15 Units Subcutaneous TID WC  . insulin aspart   0-5 Units Subcutaneous QHS  . ipratropium  0.5 mg Nebulization BID  . isosorbide mononitrate  30 mg Oral Daily  . levalbuterol  0.63 mg Nebulization BID  . mouth rinse  15 mL Mouth Rinse BID  . mometasone-formoterol  2 puff Inhalation BID  . nicotine  21 mg Transdermal Daily  . sodium chloride flush  3 mL Intravenous Q12H   Continuous Infusions: . sodium chloride Stopped (12/19/17 1443)     LOS: 14 days   I have spent 20 minutes face to face with the patient and on the [place] discussing the patients care, assessment, plan and disposition with other care givers. >50% of the time was devoted counseling the patient about the risks and benefits of treatment and coordinating care.   Ankit Joline Maxcy, MD [facility] Pager [phone]   If 7PM-7AM, please contact night-coverage [email or web address] Password [email or web address] 12/28/2017, 11:03 AM

## 2017-12-28 NOTE — Progress Notes (Signed)
CSW continues to work towards placement at [facility]- set updated therapy notes to facility this morning- facility has called insurance 3 times and been informed they would be called today regarding determination but still has not been given approval as of 5pm (when [place] closes) today  CSW provided facility with after hours CSW number in case approval provided outside of business hours  Burna Sis, LCSW Clinical Social Worker [phone]

## 2017-12-28 NOTE — Progress Notes (Signed)
Occupational Therapy Treatment Patient Details Name: Chris [name]. MRN: [id number] DOB: 1966-05-15 Today's Date: 12/28/2017    History of present illness 52 year old male patient with history of chronic respiratory failure in the setting of stage III chronic obstructive pulmonary disease and ongoing tobacco abuse.  Recently discharged after a prolonged hospitalization due to influenza pneumonia, resulting in ARDS, prolonged ventilator dependence, and ultimately tracheostomy as well as dialysis requirement.  Being treated for acute on chronic hypoxic and hypercarbic respiratory failure in the setting of acute exacerbation of chronic obstructive pulmonary disease and what appears to be significant component of laryngal pharyngeal reflux disease as evidenced by marked upper airway wheezing.    OT comments  Pt progressing towards established OT goals. Pt recently returning to bed after sitting OOB in the recliner for ~4 hours and then using the [facility]. Pt motivated and agreeable to perform theraband exercises while sitting at EOB. Pt requiring Min VCs for performing exercises and demonstrated fatigue at the end of each set. Continue to recommend dc to SNF for further OT to increase safety and independence with ADLs and functional mobility. Will continue to follow acutely as admitted.    Follow Up Recommendations  SNF;Supervision/Assistance - 24 hour    Equipment Recommendations  Other (comment)(defer to next venue)    Recommendations for Other Services      Precautions / Restrictions Precautions Precautions: Fall Precaution Comments: legs give out unexpectedly Restrictions Weight Bearing Restrictions: No       Mobility Bed Mobility Overal bed mobility: Needs Assistance Bed Mobility: Supine to Sit     Supine to sit: Min assist     General bed mobility comments: Min A to for support to pull up into sitting  Transfers                 General transfer comment: Pt recently  return to bed     Balance Overall balance assessment: Needs assistance Sitting-balance support: Feet supported;Bilateral upper extremity supported Sitting balance-Leahy Scale: Fair                                     ADL either performed or assessed with clinical judgement   ADL Overall ADL's : Needs assistance/impaired                                       General ADL Comments: Pt sitting in recliner for ~4 hours after session with PT. Pt with recent return to bed. Pt performed UE exercises at EOB and was very happy to participate.     Vision       Perception     Praxis      Cognition Arousal/Alertness: Awake/alert Behavior During Therapy: WFL for tasks assessed/performed Overall Cognitive Status: No family/caregiver present to determine baseline cognitive functioning Area of Impairment: Attention;Memory;Safety/judgement;Problem solving                   Current Attention Level: Sustained Memory: Decreased short-term memory   Safety/Judgement: Decreased awareness of safety;Decreased awareness of deficits   Problem Solving: Requires verbal cues;Requires tactile cues General Comments: Unsure of his baseline, mumbles, not clear at times.         Exercises Exercises: General Upper Extremity General Exercises - Upper Extremity Shoulder Flexion: Both;Strengthening;Theraband;Seated;20 reps Theraband Level (Shoulder Flexion): Level 1 (Yellow) Shoulder  Extension: Both;Strengthening;Theraband;Seated;20 reps Theraband Level (Shoulder Extension): Level 1 (Yellow) Shoulder ABduction: Theraband;AROM;Both;Seated;20 reps Theraband Level (Shoulder Abduction): Level 1 (Yellow) Shoulder Horizontal ABduction: AROM;Both;Seated;Theraband;20 reps Theraband Level (Shoulder Horizontal Abduction): Level 1 (Yellow) Shoulder Horizontal ADduction: AROM;Both;Seated;Theraband;20 reps Theraband Level (Shoulder Horizontal Adduction): Level 1 (Yellow) Elbow  Flexion: AROM;Both;Theraband;20 reps;Seated Theraband Level (Elbow Flexion): Level 1 (Yellow) Elbow Extension: Strengthening;Both;Theraband;20 reps;Seated Theraband Level (Elbow Extension): Level 1 (Yellow)   Shoulder Instructions       General Comments Family arriving at end of session    Pertinent Vitals/ Pain       Pain Assessment: Faces Faces Pain Scale: Hurts little more Pain Location: back Pain Descriptors / Indicators: Aching Pain Intervention(s): Monitored during session;Repositioned  Home Living                                          Prior Functioning/Environment              Frequency  Min 2X/week        Progress Toward Goals  OT Goals(current goals can now be found in the care plan section)  Progress towards OT goals: Progressing toward goals  Acute Rehab OT Goals Patient Stated Goal: be able to stand up by himself again OT Goal Formulation: With patient Time For Goal Achievement: 01/07/18 Potential to Achieve Goals: Good ADL Goals Pt Will Perform Upper Body Dressing: with modified independence;sitting Pt Will Perform Lower Body Dressing: with min guard assist;sit to/from stand Pt Will Transfer to Toilet: with supervision;ambulating Pt Will Perform Toileting - Clothing Manipulation and hygiene: with supervision;sit to/from stand Pt/caregiver will Perform Home Exercise Program: Increased strength;Both right and left upper extremity;With theraband;With written HEP provided  Plan Discharge plan remains appropriate    Co-evaluation                 AM-PAC PT "6 Clicks" Daily Activity     Outcome Measure   Help from another person eating meals?: None Help from another person taking care of personal grooming?: A Little Help from another person toileting, which includes using toliet, bedpan, or urinal?: A Lot Help from another person bathing (including washing, rinsing, drying)?: A Lot Help from another person to put on and  taking off regular upper body clothing?: None Help from another person to put on and taking off regular lower body clothing?: A Lot 6 Click Score: 17    End of Session Equipment Utilized During Treatment: Other (comment);Oxygen(theraband)  OT Visit Diagnosis: Unsteadiness on feet (R26.81);Other abnormalities of gait and mobility (R26.89);Muscle weakness (generalized) (M62.81);Other symptoms and signs involving cognitive function   Activity Tolerance Patient tolerated treatment well   Patient Left in bed;with call bell/phone within reach;Other (comment)(all 4 rails up by Pt request)   Nurse Communication Need for lift equipment;Mobility status;Other (comment)(Pt is complaining about missing cell phone)        Time: 0981-1914 OT Time Calculation (min): 21 min  Charges: OT General Charges $OT Visit: 1 Visit OT Treatments $Therapeutic Activity: 8-22 mins  Aitana Burry MSOT, OTR/L Acute Rehab Pager: [phone] Office: [phone]   Theodoro Grist Hevin Jeffcoat 12/28/2017, 5:35 PM

## 2017-12-28 NOTE — Progress Notes (Signed)
Patient refuses BiPAP.

## 2017-12-28 NOTE — Progress Notes (Addendum)
0730 Bedside shift report, pt sleeping, easy to arouse. No complaints, fall precautions in place, WCTM.   0905 Pt medicated and assessed, see flow sheet and MAR. Pt weak, awaiting PT to get pt out of bed. Fall precautions in place, [facility].   1030 PT assisted pt up to recliner, tolerated well.   1230 Pt sitting up eating lunch. No complaints, asking if he's going to be discharged today. Awaiting placement and insurance.   1445 Pt still sitting in recliner, NAD, no complaints, WCTM.   1615 OT here to see pt.   1800 Pt sitting on side of bed visiting with family. Family brought pt dinner. NAD, no complaints, RN updated family with POC, still awaiting insurance authorization. WCTM.

## 2017-12-28 NOTE — Progress Notes (Signed)
atient Diabetes Program Recommendations  AACE/[name]: New Consensus Statement on Inpatient Glycemic Control (2015)  Target Ranges:  Prepandial:   less than 140 mg/dL      Peak postprandial:   less than 180 mg/dL (1-2 hours)      Critically ill patients:  140 - 180 mg/dL   Results for GLENROY, CROSSEN (MRN [id number]) as of 12/28/2017 10:14  Ref. Range 12/27/2017 07:21 12/27/2017 11:27 12/27/2017 16:54 12/27/2017 21:45 12/28/2017 06:47  Glucose-Capillary Latest Ref Range: 65 - 99 mg/dL 782 (H) 956 (H) 213 (H) 284 (H) 223 (H)   Review of Glycemic Control  Diabetes history: DM 2 Outpatient Diabetes medications: Levemir 30 units, Metformin 1000 mg Daily Current orders for Inpatient glycemic control: Novolog Moderate Correction 0-15 units + Novolog HS scale 0-5 units  Inpatient Diabetes Program Recommendations:    Glucose trends consistently in the 200's. Patient is on Levemir at home. Please consider ordering low dose basal insulin while here, Levemir 12-15 units Q24 hours.  Thanks,  Christena Deem RN, MSN, BC-ADM, [facility] Inpatient Diabetes Coordinator Team Pager [phone] (8a-5p)

## 2017-12-29 DIAGNOSIS — Z825 Family history of asthma and other chronic lower respiratory diseases: Secondary | ICD-10-CM | POA: Diagnosis not present

## 2017-12-29 DIAGNOSIS — I110 Hypertensive heart disease with heart failure: Secondary | ICD-10-CM | POA: Diagnosis present

## 2017-12-29 DIAGNOSIS — Z7901 Long term (current) use of anticoagulants: Secondary | ICD-10-CM | POA: Diagnosis not present

## 2017-12-29 DIAGNOSIS — E669 Obesity, unspecified: Secondary | ICD-10-CM | POA: Diagnosis present

## 2017-12-29 DIAGNOSIS — E08311 Diabetes mellitus due to underlying condition with unspecified diabetic retinopathy with macular edema: Secondary | ICD-10-CM | POA: Diagnosis not present

## 2017-12-29 DIAGNOSIS — B20 Human immunodeficiency virus [HIV] disease: Secondary | ICD-10-CM | POA: Diagnosis present

## 2017-12-29 DIAGNOSIS — R2689 Other abnormalities of gait and mobility: Secondary | ICD-10-CM | POA: Diagnosis not present

## 2017-12-29 DIAGNOSIS — R0602 Shortness of breath: Secondary | ICD-10-CM | POA: Diagnosis not present

## 2017-12-29 DIAGNOSIS — I5032 Chronic diastolic (congestive) heart failure: Secondary | ICD-10-CM | POA: Diagnosis present

## 2017-12-29 DIAGNOSIS — M6281 Muscle weakness (generalized): Secondary | ICD-10-CM | POA: Diagnosis not present

## 2017-12-29 DIAGNOSIS — F419 Anxiety disorder, unspecified: Secondary | ICD-10-CM | POA: Diagnosis present

## 2017-12-29 DIAGNOSIS — I509 Heart failure, unspecified: Secondary | ICD-10-CM | POA: Diagnosis not present

## 2017-12-29 DIAGNOSIS — Z6835 Body mass index (BMI) 35.0-35.9, adult: Secondary | ICD-10-CM | POA: Diagnosis not present

## 2017-12-29 DIAGNOSIS — Z8249 Family history of ischemic heart disease and other diseases of the circulatory system: Secondary | ICD-10-CM | POA: Diagnosis not present

## 2017-12-29 DIAGNOSIS — J189 Pneumonia, unspecified organism: Secondary | ICD-10-CM | POA: Diagnosis not present

## 2017-12-29 DIAGNOSIS — R69 Illness, unspecified: Secondary | ICD-10-CM | POA: Diagnosis not present

## 2017-12-29 DIAGNOSIS — F329 Major depressive disorder, single episode, unspecified: Secondary | ICD-10-CM | POA: Diagnosis present

## 2017-12-29 DIAGNOSIS — R609 Edema, unspecified: Secondary | ICD-10-CM | POA: Diagnosis not present

## 2017-12-29 DIAGNOSIS — R0902 Hypoxemia: Secondary | ICD-10-CM | POA: Diagnosis not present

## 2017-12-29 DIAGNOSIS — R498 Other voice and resonance disorders: Secondary | ICD-10-CM | POA: Diagnosis not present

## 2017-12-29 DIAGNOSIS — R0789 Other chest pain: Secondary | ICD-10-CM | POA: Diagnosis not present

## 2017-12-29 DIAGNOSIS — Y95 Nosocomial condition: Secondary | ICD-10-CM | POA: Diagnosis present

## 2017-12-29 DIAGNOSIS — K219 Gastro-esophageal reflux disease without esophagitis: Secondary | ICD-10-CM | POA: Diagnosis present

## 2017-12-29 DIAGNOSIS — R41841 Cognitive communication deficit: Secondary | ICD-10-CM | POA: Diagnosis not present

## 2017-12-29 DIAGNOSIS — R1312 Dysphagia, oropharyngeal phase: Secondary | ICD-10-CM | POA: Diagnosis not present

## 2017-12-29 DIAGNOSIS — Z87891 Personal history of nicotine dependence: Secondary | ICD-10-CM | POA: Diagnosis not present

## 2017-12-29 DIAGNOSIS — J129 Viral pneumonia, unspecified: Secondary | ICD-10-CM | POA: Diagnosis not present

## 2017-12-29 DIAGNOSIS — J181 Lobar pneumonia, unspecified organism: Secondary | ICD-10-CM | POA: Diagnosis not present

## 2017-12-29 DIAGNOSIS — Z882 Allergy status to sulfonamides status: Secondary | ICD-10-CM | POA: Diagnosis not present

## 2017-12-29 DIAGNOSIS — J9621 Acute and chronic respiratory failure with hypoxia: Secondary | ICD-10-CM | POA: Diagnosis not present

## 2017-12-29 DIAGNOSIS — R079 Chest pain, unspecified: Secondary | ICD-10-CM | POA: Diagnosis not present

## 2017-12-29 DIAGNOSIS — N611 Abscess of the breast and nipple: Secondary | ICD-10-CM | POA: Diagnosis not present

## 2017-12-29 DIAGNOSIS — I480 Paroxysmal atrial fibrillation: Secondary | ICD-10-CM | POA: Diagnosis not present

## 2017-12-29 DIAGNOSIS — Z7401 Bed confinement status: Secondary | ICD-10-CM | POA: Diagnosis not present

## 2017-12-29 DIAGNOSIS — J449 Chronic obstructive pulmonary disease, unspecified: Secondary | ICD-10-CM | POA: Diagnosis not present

## 2017-12-29 DIAGNOSIS — I10 Essential (primary) hypertension: Secondary | ICD-10-CM | POA: Diagnosis not present

## 2017-12-29 DIAGNOSIS — B370 Candidal stomatitis: Secondary | ICD-10-CM | POA: Diagnosis not present

## 2017-12-29 DIAGNOSIS — M2550 Pain in unspecified joint: Secondary | ICD-10-CM | POA: Diagnosis not present

## 2017-12-29 DIAGNOSIS — E119 Type 2 diabetes mellitus without complications: Secondary | ICD-10-CM | POA: Diagnosis not present

## 2017-12-29 DIAGNOSIS — E872 Acidosis: Secondary | ICD-10-CM | POA: Diagnosis not present

## 2017-12-29 DIAGNOSIS — I5023 Acute on chronic systolic (congestive) heart failure: Secondary | ICD-10-CM | POA: Diagnosis not present

## 2017-12-29 DIAGNOSIS — Z808 Family history of malignant neoplasm of other organs or systems: Secondary | ICD-10-CM | POA: Diagnosis not present

## 2017-12-29 DIAGNOSIS — Z833 Family history of diabetes mellitus: Secondary | ICD-10-CM | POA: Diagnosis not present

## 2017-12-29 DIAGNOSIS — Z803 Family history of malignant neoplasm of breast: Secondary | ICD-10-CM | POA: Diagnosis not present

## 2017-12-29 DIAGNOSIS — Z79891 Long term (current) use of opiate analgesic: Secondary | ICD-10-CM | POA: Diagnosis not present

## 2017-12-29 DIAGNOSIS — E1169 Type 2 diabetes mellitus with other specified complication: Secondary | ICD-10-CM | POA: Diagnosis not present

## 2017-12-29 DIAGNOSIS — I4891 Unspecified atrial fibrillation: Secondary | ICD-10-CM | POA: Diagnosis not present

## 2017-12-29 DIAGNOSIS — Z794 Long term (current) use of insulin: Secondary | ICD-10-CM | POA: Diagnosis not present

## 2017-12-29 DIAGNOSIS — R531 Weakness: Secondary | ICD-10-CM | POA: Diagnosis not present

## 2017-12-29 DIAGNOSIS — J439 Emphysema, unspecified: Secondary | ICD-10-CM | POA: Diagnosis present

## 2017-12-29 DIAGNOSIS — G8929 Other chronic pain: Secondary | ICD-10-CM | POA: Diagnosis not present

## 2017-12-29 LAB — GLUCOSE, CAPILLARY
GLUCOSE-CAPILLARY: 153 mg/dL — AB (ref 65–99)
Glucose-Capillary: 149 mg/dL — ABNORMAL HIGH (ref 65–99)
Glucose-Capillary: 264 mg/dL — ABNORMAL HIGH (ref 65–99)

## 2017-12-29 MED ORDER — OXYCODONE HCL 5 MG PO TABS: 5.0000 mg | ORAL_TABLET | Freq: Three times a day (TID) | ORAL | 0 refills | Status: DC | PRN

## 2017-12-29 MED ORDER — ISOSORBIDE MONONITRATE ER 30 MG PO TB24: 30.0000 mg | ORAL_TABLET | Freq: Every day | ORAL | 0 refills | Status: DC

## 2017-12-29 MED ORDER — DOCUSATE SODIUM 50 MG/5ML PO LIQD: 100.0000 mg | Freq: Two times a day (BID) | ORAL | 0 refills | Status: AC | PRN

## 2017-12-29 MED ORDER — FLUCONAZOLE 100 MG PO TABS: 100.0000 mg | ORAL_TABLET | Freq: Every day | ORAL | 0 refills | Status: AC

## 2017-12-29 MED ORDER — HYDRALAZINE HCL 50 MG PO TABS: 50.0000 mg | ORAL_TABLET | Freq: Three times a day (TID) | ORAL | 0 refills | Status: DC

## 2017-12-29 MED ORDER — MOMETASONE FURO-FORMOTEROL FUM 200-5 MCG/ACT IN AERO: 2.0000 | INHALATION_SPRAY | Freq: Two times a day (BID) | RESPIRATORY_TRACT | 0 refills | Status: DC

## 2017-12-29 MED ORDER — DILTIAZEM HCL ER COATED BEADS 240 MG PO CP24: 240.0000 mg | ORAL_CAPSULE | Freq: Every day | ORAL | 0 refills | Status: DC

## 2017-12-29 NOTE — Progress Notes (Signed)
PROGRESS NOTE    Chris [name].  ONG:[id number] DOB: May 28, 1966 DOA: 12/14/2017 PCP: Claiborne Rigg, NP   Brief Narrative:  52 year old African-American male with history of COPD requiring as needed home oxygen, essential hypertension, chronic diastolic CHF ejection fraction 60%, positive, diabetes mellitus type 2, ongoing tobacco use, paroxysmal atrial fibrillation on Eliquis, recent influenza induced ARDS requiring tracheostomy intubation and few episodes of dialysis was admitted to the hospital for acute on chronic hypoxic respiratory failure due to COPD exacerbation.  He was initially taken to the ICU as he required BiPAP.  Over the course of several days his oxygen was weaned off and transition to medical service.   Assessment & Plan:   Principal Problem:   COPD exacerbation (HCC) Active Problems:   Obstructive sleep apnea syndrome   Essential hypertension, benign   Cigarette nicotine dependence without complication   GERD (gastroesophageal reflux disease)   Acute on chronic respiratory failure with hypoxia (HCC)   PAF (paroxysmal atrial fibrillation) (HCC)   Diabetes mellitus type 2 in obese (HCC)   HIV (human immunodeficiency virus infection) (HCC)   Acute on chronic hypoxic respiratory failure; stable Acute moderate to severe exacerbation- advanced persistent COPD; stable - Continue patient on prednisone taper.  Currently on Xopenex scheduled and as necessary.  Continue Dulera and Atrovent -His oxygen has been weaned down to 2 L nasal cannula which is his baseline. -Currently waiting on bed, likely today.   History of atrial fibrillation; stable -Continue Eliquis 5 mg twice daily and Cardizem 240 mg daily  Chronic diastolic CHF, ejection fraction 60% -Appears euvolemic and well compensated.  Continue home medications  Diabetes mellitus type 2, stable  Peripheral neuropathy -Gabapentin 3 times daily 300 mg -We will put patient on Lantus 10 units daily due to  hyperglycemia.  But can resume his home regimen at the time of discharge  Essential hypertension; stable - Patient is on Cardizem, Imdur and hydralazine has been added here  Oral Candida -On oral Diflucan for 3 more day./   Morbid obesity with obstructive sleep apnea -Use CPAP at bedtime.  Follow-up outpatient with primary care physician  Due to generalized weakness physical therapy had evaluated the patient who recommended skilled nursing facility.  DVT prophylaxis: Eliquis Code Status: Full code Family Communication:   No family is at the bedside Disposition Plan: SNF today  Consultants:   None  Procedures:   None  Antimicrobials:   On Diflucan until Jan 02, 2018   Subjective: No acute events overnight.   Review of Systems Otherwise negative except as per HPI, including: HEENT/EYES = negative for pain, redness, loss of vision, double vision, blurred vision, loss of hearing, sore throat, hoarseness, dysphagia Cardiovascular= negative for chest pain, palpitation, murmurs, lower extremity swelling Respiratory/lungs= negative for shortness of breath, cough, hemoptysis, wheezing, mucus production Gastrointestinal= negative for nausea, vomiting,, abdominal pain, melena, hematemesis Genitourinary= negative for Dysuria, Hematuria, Change in Urinary Frequency MSK = Negative for arthralgia, myalgias, Back Pain, Joint swelling  Neurology= Negative for headache, seizures, numbness, tingling  Psychiatry= Negative for anxiety, depression, suicidal and homocidal ideation Allergy/Immunology= Medication/Food allergy as listed  Skin= Negative for Rash, lesions, ulcers, itching    Objective: Vitals:   12/29/17 0500 12/29/17 0640 12/29/17 0739 12/29/17 0756  BP:  128/61 140/83   Pulse:  89 89   Resp:  20 20   Temp:  98.8 F (37.1 C) 98.4 F (36.9 C)   TempSrc:  Oral Oral   SpO2:  96% 95% 93%  Weight: 102.3 kg (225 lb 8.5 oz)     Height:        Intake/Output Summary (Last  24 hours) at 12/29/2017 1058 Last data filed at 12/29/2017 0846 Gross per 24 hour  Intake 684 ml  Output 1400 ml  Net -716 ml   Filed Weights   12/27/17 0252 12/28/17 0300 12/29/17 0500  Weight: 103.3 kg (227 lb 11.8 oz) 100.9 kg (222 lb 7.1 oz) 102.3 kg (225 lb 8.5 oz)    Examination: Constitutional: NAD, calm, comfortable Eyes: PERRL, lids and conjunctivae normal ENMT: Mucous membranes are moist. Posterior pharynx clear of any exudate or lesions.Normal dentition.  Neck: normal, supple, no masses, no thyromegaly Respiratory: diminished BS at the bases.  Cardiovascular: Regular rate and rhythm, no murmurs / rubs / gallops. No extremity edema. 2+ pedal pulses. No carotid bruits.  Abdomen: no tenderness, no masses palpated. No hepatosplenomegaly. Bowel sounds positive.  Musculoskeletal: no clubbing / cyanosis. No joint deformity upper and lower extremities. Good ROM, no contractures. Normal muscle tone.  Skin: no rashes, lesions, ulcers. No induration Neurologic: CN 2-12 grossly intact. Sensation intact, DTR normal. Strength 4/5 in all 4 LE > UE Psychiatric: Normal judgment and insight. Alert and oriented x 3. Normal mood.     Data Reviewed:   CBC: Recent Labs  Lab 12/24/17 0324 12/25/17 0303 12/26/17 0344 12/27/17 0244 12/28/17 0357  WBC 14.4* 14.6* 15.3* 15.9* 17.0*  HGB 10.3* 10.1* 9.5* 9.2* 8.5*  HCT 34.8* 34.1* 32.2* 31.3* 29.1*  MCV 95.6 96.6 95.3 98.7 97.3  PLT 173 200 257 273 300   Basic Metabolic Panel: Recent Labs  Lab 12/24/17 0324 12/25/17 0303 12/26/17 0344 12/27/17 0244 12/28/17 0357  NA 142 142 142 142 139  K 3.7 3.5 3.7 4.6 4.3  CL 102 102 102 105 100*  CO2 GLUCOSE 174* 162* 208* 228* 183*  BUN 35* 27* 31* 27* 22*  CREATININE 1.27* 1.21 1.36* 1.26* 1.12  CALCIUM 9.1 8.9 8.7* 8.7* 8.8*  MG 2.1 1.9 2.0 2.1 1.9   GFR: Estimated Creatinine Clearance: 91.9 mL/min (by C-G formula based on SCr of 1.12 mg/dL). Liver Function  Tests: No results for input(s): AST, ALT, ALKPHOS, BILITOT, PROT, ALBUMIN in the last 168 hours. No results for input(s): LIPASE, AMYLASE in the last 168 hours. No results for input(s): AMMONIA in the last 168 hours. Coagulation Profile: No results for input(s): INR, PROTIME in the last 168 hours. Cardiac Enzymes: No results for input(s): CKTOTAL, CKMB, CKMBINDEX, TROPONINI in the last 168 hours. BNP (last 3 results) No results for input(s): PROBNP in the last 8760 hours. HbA1C: No results for input(s): HGBA1C in the last 72 hours. CBG: Recent Labs  Lab 12/28/17 1135 12/28/17 1735 12/28/17 2142 12/29/17 0639 12/29/17 0737  GLUCAP 206* 255* 201* 153* 149*   Lipid Profile: No results for input(s): CHOL, HDL, LDLCALC, TRIG, CHOLHDL, LDLDIRECT in the last 72 hours. Thyroid Function Tests: No results for input(s): TSH, T4TOTAL, FREET4, T3FREE, THYROIDAB in the last 72 hours. Anemia Panel: No results for input(s): VITAMINB12, FOLATE, FERRITIN, TIBC, IRON, RETICCTPCT in the last 72 hours. Sepsis Labs: No results for input(s): PROCALCITON, LATICACIDVEN in the last 168 hours.  No results found for this or any previous visit (from the past 240 hour(s)).    Radiology Studies: No results found.  Scheduled Meds: . amitriptyline  100 mg Oral QHS  . apixaban  5 mg Per Tube BID  . bictegravir-emtricitabine-tenofovir AF  1  tablet Oral Daily  . dapsone  100 mg Oral Daily  . diltiazem  240 mg Oral Daily  . docusate  100 mg Per Tube BID  . fluconazole  100 mg Oral Daily  . gabapentin  300 mg Oral TID  . hydrALAZINE  50 mg Oral Q8H  . insulin aspart  0-15 Units Subcutaneous TID WC  . insulin aspart  0-5 Units Subcutaneous QHS  . insulin detemir  10 Units Subcutaneous Daily  . isosorbide mononitrate  30 mg Oral Daily  . mouth rinse  15 mL Mouth Rinse BID  . mometasone-formoterol  2 puff Inhalation BID  . nicotine  21 mg Transdermal Daily  . sodium chloride flush  3 mL Intravenous Q12H    Continuous Infusions: . sodium chloride Stopped (12/19/17 1443)     LOS: 15 days   I have spent 20 minutes face to face with the patient and on the [place] discussing the patients care, assessment, plan and disposition with other care givers. >50% of the time was devoted counseling the patient about the risks and benefits of treatment and coordinating care.   Saeed Toren Joline Maxcy, MD [facility] Pager [phone]   If 7PM-7AM, please contact night-coverage [email or web address] Password [facility] 12/29/2017, 10:58 AM

## 2017-12-29 NOTE — Progress Notes (Signed)
This encounter was created in error - please disregard.

## 2017-12-29 NOTE — Progress Notes (Signed)
Patient will DC to: [facility] Anticipated DC date: 12/29/17 Family notified: Pt alerting spouse, CSW left her vm Transport by: PTAR 1:45pm   Per MD patient ready for DC to [place]. RN, patient, patient's family, and facility notified of DC. Discharge Summary sent to facility. RN given number for report [phone] Room 606P). DC packet on chart with signed scripts. Ambulance transport requested for patient.   CSW signing off.  Cristobal Goldmann, LCSW Clinical Social Worker [phone]

## 2017-12-29 NOTE — Progress Notes (Signed)
Patient has insurance approval to discharge to [place] today.  Osborne Casco Amara Justen LCSW [phone]

## 2017-12-29 NOTE — Progress Notes (Signed)
CSW left message for patient's wife to let her know of discharge timing.   Osborne Casco Princetta Uplinger LCSW [phone]

## 2017-12-29 NOTE — Clinical Social Work Placement (Signed)
   CLINICAL SOCIAL WORK PLACEMENT  NOTE  Date:  12/29/2017  Patient Details  Name: Chris [name]. MRN: [id number] Date of Birth: 06-26-1966  Clinical Social Work is seeking post-discharge placement for this patient at the Skilled  Nursing Facility level of care (*CSW will initial, date and re-position this form in  chart as items are completed):  Yes   Patient/family provided with [place] Clinical Social Work Department's list of facilities offering this level of care within the geographic area requested by the patient (or if unable, by the patient's family).  Yes   Patient/family informed of their freedom to choose among providers that offer the needed level of care, that participate in Medicare, Medicaid or managed care program needed by the patient, have an available bed and are willing to accept the patient.  Yes   Patient/family informed of [facility]'s ownership interest in [facility] and [facility], as well as of the fact that they are under no obligation to receive care at these facilities.  PASRR submitted to EDS on       PASRR number received on       Existing PASRR number confirmed on 12/23/17     FL2 transmitted to all facilities in geographic area requested by pt/family on 12/23/17     FL2 transmitted to all facilities within larger geographic area on       Patient informed that his/her managed care company has contracts with or will negotiate with certain facilities, including the following:        Yes   Patient/family informed of bed offers received.  Patient chooses bed at [facility]     Physician recommends and patient chooses bed at      Patient to be transferred to [facility] on 12/29/17.  Patient to be transferred to facility by ptar     Patient family notified on 12/29/17 of transfer.  Name of family member notified:  Patient notifying spouse, CSW left vm for her     PHYSICIAN Please sign FL2     Additional Comment:     _______________________________________________ Mearl Latin, LCSWA 12/29/2017, 10:27 AM

## 2017-12-29 NOTE — Progress Notes (Addendum)
0715 Bedside shift report, pt sleeping, easy to arouse. Fall precautions in place, [facility]  0845 Pt assessed, see flow sheet. Condom cath leaking, pt cleaned up and pads changed. Pt to [facility] today. MD at bedside, [facility].   1030 RN called pt's spouse, Lonell Stamos [phone] about discharge to SNF today. No answer to RN left message, awaiting return call. RN spoke to pt at bedside and pt denies having a legal guardian. No needs at this time, WCTM.   1115 Pt's spouse Patent attorney returned call. RN updated with discharge to [facility] today. All questions answered.   1230 Pt sitting up eating lunch. NAD, no complaints, asking when he's leaving. RN updated with POC.   1400 Pt resting in bed, belongings packed, IV removed without difficulty. NAD, no complaints, awaiting transport.   1430 Pt transferred to [facility] via EMS, all belongings sent with pt including travel bag, cell phone, charger, lotion, clothing, and shoes.

## 2017-12-29 NOTE — Consult Note (Signed)
   [facility] CM Inpatient Consult   12/29/2017  Chris Stutsman Stacks Jr. February 19, 1966 [id number]  Patient evaluated for community based chronic disease management services with [facility] Care Management Program as a benefit of patient's [facility]. Spoke with patient  to explain follow up of [facility] Care Management services at [facility].  Patient will receive post hospital discharge call and  assessments and disease process education for transitioning back home, if appropriate.  Of note, [facility] Care Management services does not replace or interfere with any services that are arranged by inpatient case management or social work.  For additional questions or referrals please contact:    Charlesetta Shanks, RN BSN CCM [facility]  [phone] business mobile phone Toll free office [phone]

## 2017-12-30 ENCOUNTER — Other Ambulatory Visit: Payer: Self-pay | Admitting: *Deleted

## 2017-12-30 DIAGNOSIS — I4891 Unspecified atrial fibrillation: Secondary | ICD-10-CM | POA: Diagnosis not present

## 2017-12-30 DIAGNOSIS — J449 Chronic obstructive pulmonary disease, unspecified: Secondary | ICD-10-CM | POA: Diagnosis not present

## 2017-12-30 DIAGNOSIS — E119 Type 2 diabetes mellitus without complications: Secondary | ICD-10-CM | POA: Diagnosis not present

## 2017-12-30 DIAGNOSIS — B370 Candidal stomatitis: Secondary | ICD-10-CM | POA: Diagnosis not present

## 2017-12-30 NOTE — Patient Outreach (Signed)
[facility]) Care Management  12/30/2017  Chris Veronica Steiner Jr. 1966/03/23 [id number]   CSW covering for [name], LCSW, attempted to contact pt today but was unsuccessful as the contact # for pt appears to be wrong. CSW contacted SNF rep at [facility] to inquire and had to leave voice message for call back.   CSW will attempt to visit pt at SNF later this week.   Reece Levy, MSW, LCSW Clinical Social Worker  [facility] [employer] [phone]

## 2017-12-31 DIAGNOSIS — J189 Pneumonia, unspecified organism: Secondary | ICD-10-CM | POA: Diagnosis not present

## 2018-01-01 ENCOUNTER — Ambulatory Visit: Payer: Self-pay | Admitting: *Deleted

## 2018-01-01 ENCOUNTER — Other Ambulatory Visit: Payer: Self-pay | Admitting: *Deleted

## 2018-01-01 DIAGNOSIS — J449 Chronic obstructive pulmonary disease, unspecified: Secondary | ICD-10-CM | POA: Diagnosis not present

## 2018-01-01 DIAGNOSIS — J189 Pneumonia, unspecified organism: Secondary | ICD-10-CM | POA: Diagnosis not present

## 2018-01-01 DIAGNOSIS — I4891 Unspecified atrial fibrillation: Secondary | ICD-10-CM | POA: Diagnosis not present

## 2018-01-01 DIAGNOSIS — R69 Illness, unspecified: Secondary | ICD-10-CM | POA: Diagnosis not present

## 2018-01-01 NOTE — Patient Outreach (Signed)
[facility]) Care Management  01/01/2018  Chris Gear Olenik Jr. 10-07-1965 [id number]   CSW attempted to reach pt again today but was unsuccessful. CSW contacted [facility] and was able to confirm that pt is there (room 606).   CSW has left a message again today for SNF rep and await call back. This CSW will update assigned CSW upon her return next week.  Reece Levy, MSW, LCSW Clinical Social Worker  [facility] [phone]     Reece Levy, MSW, LCSW Clinical Social Worker  [facility] [employer] [phone]

## 2018-01-04 DIAGNOSIS — G8929 Other chronic pain: Secondary | ICD-10-CM | POA: Diagnosis not present

## 2018-01-04 DIAGNOSIS — J189 Pneumonia, unspecified organism: Secondary | ICD-10-CM | POA: Diagnosis not present

## 2018-01-06 DIAGNOSIS — I509 Heart failure, unspecified: Secondary | ICD-10-CM | POA: Diagnosis not present

## 2018-01-07 ENCOUNTER — Encounter: Payer: Self-pay | Admitting: *Deleted

## 2018-01-07 ENCOUNTER — Other Ambulatory Visit: Payer: Self-pay | Admitting: *Deleted

## 2018-01-07 NOTE — Patient Outreach (Signed)
[facility]) Care Management  01/07/2018  Chris Frangos Colan Jr. Jun 19, 1966 [id number]   CSW was able to make initial contact with patient today to perform the assessment, as well as assess and assist with social work needs and services, when [place] met with patient at [facility] [place] where patient currently resides to receive short-term rehabilitative services.  CSW introduced self, explained role and types of services provided through [place] [facility] ([place] [facility]).  CSW further explained to patient that CSW works with Chris [name], [facility], also with [facility]. CSW then explained the reason for the visit, indicating that Chris [name] thought that patient would benefit from social work services and resources to assist with discharge planning needs and services from the skilled nursing facility.  CSW obtained two HIPAA compliant identifiers from patient, which included patient's name and date of birth. Patient reported, "I currently have everything I need".  Patient admits, "I feel very weak and fatigued, but I am really trying to push myself with my therapists".  Patient reported that he already has a tentative discharge date of Monday, January 18, 2018.  Patient plans to return home to live with his wife at time of discharge.  Patient reports being able to perform all activities of daily living independently.  Patient is agreeable to home health services being arranged for him at time of discharge.  CSW agreed to contact the social worker at [facility] to assist with patient's discharge planning needs.  Patient admits being able to identify any social work needs at present.  Patient has encouraged CSW to meet with him at the facility on the day of discharge to ensure that all discharge planning arrangements are in place.  CSW voiced understanding was agreeable to this plan. Chris [name],  BSW, MSW, LCSW  Licensed Education officer, [facility]  Mailing [place] [address], [place], [place] [address] Physical Address-[address], [address], [place] [address] Toll Free Main # [phone] Fax # [phone] Cell # [phone]  Office # [phone] [email or web address]

## 2018-01-08 DIAGNOSIS — I509 Heart failure, unspecified: Secondary | ICD-10-CM | POA: Diagnosis not present

## 2018-01-09 DIAGNOSIS — J189 Pneumonia, unspecified organism: Secondary | ICD-10-CM | POA: Diagnosis not present

## 2018-01-09 DIAGNOSIS — Z833 Family history of diabetes mellitus: Secondary | ICD-10-CM | POA: Diagnosis not present

## 2018-01-09 DIAGNOSIS — Z8249 Family history of ischemic heart disease and other diseases of the circulatory system: Secondary | ICD-10-CM | POA: Diagnosis not present

## 2018-01-09 DIAGNOSIS — Z6835 Body mass index (BMI) 35.0-35.9, adult: Secondary | ICD-10-CM | POA: Diagnosis not present

## 2018-01-09 DIAGNOSIS — E669 Obesity, unspecified: Secondary | ICD-10-CM | POA: Diagnosis present

## 2018-01-09 DIAGNOSIS — R609 Edema, unspecified: Secondary | ICD-10-CM | POA: Diagnosis not present

## 2018-01-09 DIAGNOSIS — F419 Anxiety disorder, unspecified: Secondary | ICD-10-CM | POA: Diagnosis present

## 2018-01-09 DIAGNOSIS — J449 Chronic obstructive pulmonary disease, unspecified: Secondary | ICD-10-CM | POA: Diagnosis not present

## 2018-01-09 DIAGNOSIS — Z794 Long term (current) use of insulin: Secondary | ICD-10-CM | POA: Diagnosis not present

## 2018-01-09 DIAGNOSIS — E1169 Type 2 diabetes mellitus with other specified complication: Secondary | ICD-10-CM | POA: Diagnosis present

## 2018-01-09 DIAGNOSIS — I110 Hypertensive heart disease with heart failure: Secondary | ICD-10-CM | POA: Diagnosis present

## 2018-01-09 DIAGNOSIS — K219 Gastro-esophageal reflux disease without esophagitis: Secondary | ICD-10-CM | POA: Diagnosis present

## 2018-01-09 DIAGNOSIS — E872 Acidosis: Secondary | ICD-10-CM | POA: Diagnosis not present

## 2018-01-09 DIAGNOSIS — M6281 Muscle weakness (generalized): Secondary | ICD-10-CM | POA: Diagnosis not present

## 2018-01-09 DIAGNOSIS — I5032 Chronic diastolic (congestive) heart failure: Secondary | ICD-10-CM | POA: Diagnosis present

## 2018-01-09 DIAGNOSIS — R531 Weakness: Secondary | ICD-10-CM | POA: Diagnosis not present

## 2018-01-09 DIAGNOSIS — Z803 Family history of malignant neoplasm of breast: Secondary | ICD-10-CM | POA: Diagnosis not present

## 2018-01-09 DIAGNOSIS — B20 Human immunodeficiency virus [HIV] disease: Secondary | ICD-10-CM | POA: Diagnosis present

## 2018-01-09 DIAGNOSIS — J129 Viral pneumonia, unspecified: Secondary | ICD-10-CM | POA: Diagnosis not present

## 2018-01-09 DIAGNOSIS — R0602 Shortness of breath: Secondary | ICD-10-CM | POA: Diagnosis not present

## 2018-01-09 DIAGNOSIS — R0789 Other chest pain: Secondary | ICD-10-CM | POA: Diagnosis not present

## 2018-01-09 DIAGNOSIS — J181 Lobar pneumonia, unspecified organism: Secondary | ICD-10-CM | POA: Diagnosis present

## 2018-01-09 DIAGNOSIS — Z7901 Long term (current) use of anticoagulants: Secondary | ICD-10-CM | POA: Diagnosis not present

## 2018-01-09 DIAGNOSIS — Z87891 Personal history of nicotine dependence: Secondary | ICD-10-CM | POA: Diagnosis not present

## 2018-01-09 DIAGNOSIS — R2689 Other abnormalities of gait and mobility: Secondary | ICD-10-CM | POA: Diagnosis not present

## 2018-01-09 DIAGNOSIS — F329 Major depressive disorder, single episode, unspecified: Secondary | ICD-10-CM | POA: Diagnosis present

## 2018-01-09 DIAGNOSIS — R079 Chest pain, unspecified: Secondary | ICD-10-CM | POA: Diagnosis not present

## 2018-01-09 DIAGNOSIS — R498 Other voice and resonance disorders: Secondary | ICD-10-CM | POA: Diagnosis not present

## 2018-01-09 DIAGNOSIS — Z882 Allergy status to sulfonamides status: Secondary | ICD-10-CM | POA: Diagnosis not present

## 2018-01-09 DIAGNOSIS — N611 Abscess of the breast and nipple: Secondary | ICD-10-CM | POA: Diagnosis present

## 2018-01-09 DIAGNOSIS — R0902 Hypoxemia: Secondary | ICD-10-CM | POA: Diagnosis not present

## 2018-01-09 DIAGNOSIS — R69 Illness, unspecified: Secondary | ICD-10-CM | POA: Diagnosis not present

## 2018-01-09 DIAGNOSIS — Z79891 Long term (current) use of opiate analgesic: Secondary | ICD-10-CM | POA: Diagnosis not present

## 2018-01-09 DIAGNOSIS — B370 Candidal stomatitis: Secondary | ICD-10-CM | POA: Diagnosis not present

## 2018-01-09 DIAGNOSIS — J439 Emphysema, unspecified: Secondary | ICD-10-CM | POA: Diagnosis present

## 2018-01-09 DIAGNOSIS — R41841 Cognitive communication deficit: Secondary | ICD-10-CM | POA: Diagnosis not present

## 2018-01-09 DIAGNOSIS — J9621 Acute and chronic respiratory failure with hypoxia: Secondary | ICD-10-CM | POA: Diagnosis present

## 2018-01-09 DIAGNOSIS — I10 Essential (primary) hypertension: Secondary | ICD-10-CM | POA: Diagnosis not present

## 2018-01-09 DIAGNOSIS — Z825 Family history of asthma and other chronic lower respiratory diseases: Secondary | ICD-10-CM | POA: Diagnosis not present

## 2018-01-09 DIAGNOSIS — Z808 Family history of malignant neoplasm of other organs or systems: Secondary | ICD-10-CM | POA: Diagnosis not present

## 2018-01-09 DIAGNOSIS — Y95 Nosocomial condition: Secondary | ICD-10-CM | POA: Diagnosis present

## 2018-01-09 DIAGNOSIS — I480 Paroxysmal atrial fibrillation: Secondary | ICD-10-CM | POA: Diagnosis present

## 2018-01-09 DIAGNOSIS — R1312 Dysphagia, oropharyngeal phase: Secondary | ICD-10-CM | POA: Diagnosis not present

## 2018-01-09 DIAGNOSIS — I509 Heart failure, unspecified: Secondary | ICD-10-CM | POA: Diagnosis not present

## 2018-01-11 DIAGNOSIS — I509 Heart failure, unspecified: Secondary | ICD-10-CM | POA: Diagnosis not present

## 2018-01-12 DIAGNOSIS — I509 Heart failure, unspecified: Secondary | ICD-10-CM | POA: Diagnosis not present

## 2018-01-14 ENCOUNTER — Other Ambulatory Visit: Payer: Self-pay

## 2018-01-14 ENCOUNTER — Encounter (HOSPITAL_COMMUNITY): Payer: Self-pay | Admitting: Emergency Medicine

## 2018-01-14 ENCOUNTER — Emergency Department (HOSPITAL_COMMUNITY): Payer: Medicare HMO

## 2018-01-14 ENCOUNTER — Inpatient Hospital Stay (HOSPITAL_COMMUNITY)
Admission: EM | Admit: 2018-01-14 | Discharge: 2018-01-19 | DRG: 974 | Disposition: A | Discharge status: Skilled Nursing Facility | Payer: Medicare HMO | Attending: Internal Medicine | Admitting: Internal Medicine

## 2018-01-14 DIAGNOSIS — Z7401 Bed confinement status: Secondary | ICD-10-CM | POA: Diagnosis not present

## 2018-01-14 DIAGNOSIS — J181 Lobar pneumonia, unspecified organism: Principal | ICD-10-CM | POA: Diagnosis present

## 2018-01-14 DIAGNOSIS — I110 Hypertensive heart disease with heart failure: Secondary | ICD-10-CM | POA: Diagnosis present

## 2018-01-14 DIAGNOSIS — R609 Edema, unspecified: Secondary | ICD-10-CM | POA: Diagnosis not present

## 2018-01-14 DIAGNOSIS — Z833 Family history of diabetes mellitus: Secondary | ICD-10-CM | POA: Diagnosis not present

## 2018-01-14 DIAGNOSIS — Z882 Allergy status to sulfonamides status: Secondary | ICD-10-CM | POA: Diagnosis not present

## 2018-01-14 DIAGNOSIS — Z794 Long term (current) use of insulin: Secondary | ICD-10-CM

## 2018-01-14 DIAGNOSIS — R0902 Hypoxemia: Secondary | ICD-10-CM | POA: Diagnosis not present

## 2018-01-14 DIAGNOSIS — B370 Candidal stomatitis: Secondary | ICD-10-CM | POA: Diagnosis not present

## 2018-01-14 DIAGNOSIS — J129 Viral pneumonia, unspecified: Secondary | ICD-10-CM | POA: Diagnosis not present

## 2018-01-14 DIAGNOSIS — K219 Gastro-esophageal reflux disease without esophagitis: Secondary | ICD-10-CM | POA: Diagnosis present

## 2018-01-14 DIAGNOSIS — J449 Chronic obstructive pulmonary disease, unspecified: Secondary | ICD-10-CM | POA: Diagnosis not present

## 2018-01-14 DIAGNOSIS — J189 Pneumonia, unspecified organism: Secondary | ICD-10-CM | POA: Diagnosis present

## 2018-01-14 DIAGNOSIS — Z825 Family history of asthma and other chronic lower respiratory diseases: Secondary | ICD-10-CM

## 2018-01-14 DIAGNOSIS — F329 Major depressive disorder, single episode, unspecified: Secondary | ICD-10-CM | POA: Diagnosis present

## 2018-01-14 DIAGNOSIS — Z808 Family history of malignant neoplasm of other organs or systems: Secondary | ICD-10-CM | POA: Diagnosis not present

## 2018-01-14 DIAGNOSIS — M2550 Pain in unspecified joint: Secondary | ICD-10-CM | POA: Diagnosis not present

## 2018-01-14 DIAGNOSIS — J439 Emphysema, unspecified: Secondary | ICD-10-CM | POA: Diagnosis present

## 2018-01-14 DIAGNOSIS — I480 Paroxysmal atrial fibrillation: Secondary | ICD-10-CM | POA: Diagnosis not present

## 2018-01-14 DIAGNOSIS — I5032 Chronic diastolic (congestive) heart failure: Secondary | ICD-10-CM | POA: Diagnosis present

## 2018-01-14 DIAGNOSIS — G4733 Obstructive sleep apnea (adult) (pediatric): Secondary | ICD-10-CM | POA: Diagnosis present

## 2018-01-14 DIAGNOSIS — Z7901 Long term (current) use of anticoagulants: Secondary | ICD-10-CM

## 2018-01-14 DIAGNOSIS — N611 Abscess of the breast and nipple: Secondary | ICD-10-CM | POA: Diagnosis not present

## 2018-01-14 DIAGNOSIS — Z8249 Family history of ischemic heart disease and other diseases of the circulatory system: Secondary | ICD-10-CM | POA: Diagnosis not present

## 2018-01-14 DIAGNOSIS — Z9981 Dependence on supplemental oxygen: Secondary | ICD-10-CM

## 2018-01-14 DIAGNOSIS — Z79899 Other long term (current) drug therapy: Secondary | ICD-10-CM

## 2018-01-14 DIAGNOSIS — R498 Other voice and resonance disorders: Secondary | ICD-10-CM | POA: Diagnosis not present

## 2018-01-14 DIAGNOSIS — F419 Anxiety disorder, unspecified: Secondary | ICD-10-CM | POA: Diagnosis present

## 2018-01-14 DIAGNOSIS — R079 Chest pain, unspecified: Secondary | ICD-10-CM | POA: Diagnosis not present

## 2018-01-14 DIAGNOSIS — E119 Type 2 diabetes mellitus without complications: Secondary | ICD-10-CM | POA: Diagnosis present

## 2018-01-14 DIAGNOSIS — R1312 Dysphagia, oropharyngeal phase: Secondary | ICD-10-CM | POA: Diagnosis not present

## 2018-01-14 DIAGNOSIS — E1169 Type 2 diabetes mellitus with other specified complication: Secondary | ICD-10-CM | POA: Diagnosis not present

## 2018-01-14 DIAGNOSIS — I10 Essential (primary) hypertension: Secondary | ICD-10-CM | POA: Diagnosis present

## 2018-01-14 DIAGNOSIS — R2689 Other abnormalities of gait and mobility: Secondary | ICD-10-CM | POA: Diagnosis not present

## 2018-01-14 DIAGNOSIS — Z803 Family history of malignant neoplasm of breast: Secondary | ICD-10-CM

## 2018-01-14 DIAGNOSIS — N6009 Solitary cyst of unspecified breast: Secondary | ICD-10-CM | POA: Diagnosis present

## 2018-01-14 DIAGNOSIS — Z6835 Body mass index (BMI) 35.0-35.9, adult: Secondary | ICD-10-CM | POA: Diagnosis not present

## 2018-01-14 DIAGNOSIS — Z79891 Long term (current) use of opiate analgesic: Secondary | ICD-10-CM

## 2018-01-14 DIAGNOSIS — R69 Illness, unspecified: Secondary | ICD-10-CM | POA: Diagnosis not present

## 2018-01-14 DIAGNOSIS — R0602 Shortness of breath: Secondary | ICD-10-CM | POA: Diagnosis not present

## 2018-01-14 DIAGNOSIS — B20 Human immunodeficiency virus [HIV] disease: Secondary | ICD-10-CM | POA: Diagnosis present

## 2018-01-14 DIAGNOSIS — Z21 Asymptomatic human immunodeficiency virus [HIV] infection status: Secondary | ICD-10-CM | POA: Diagnosis present

## 2018-01-14 DIAGNOSIS — E669 Obesity, unspecified: Secondary | ICD-10-CM | POA: Diagnosis present

## 2018-01-14 DIAGNOSIS — R531 Weakness: Secondary | ICD-10-CM | POA: Diagnosis not present

## 2018-01-14 DIAGNOSIS — Y95 Nosocomial condition: Secondary | ICD-10-CM | POA: Diagnosis present

## 2018-01-14 DIAGNOSIS — R41841 Cognitive communication deficit: Secondary | ICD-10-CM | POA: Diagnosis not present

## 2018-01-14 DIAGNOSIS — J9621 Acute and chronic respiratory failure with hypoxia: Secondary | ICD-10-CM | POA: Diagnosis not present

## 2018-01-14 DIAGNOSIS — R0789 Other chest pain: Secondary | ICD-10-CM | POA: Diagnosis not present

## 2018-01-14 DIAGNOSIS — Z87891 Personal history of nicotine dependence: Secondary | ICD-10-CM

## 2018-01-14 DIAGNOSIS — D638 Anemia in other chronic diseases classified elsewhere: Secondary | ICD-10-CM | POA: Diagnosis present

## 2018-01-14 DIAGNOSIS — M6281 Muscle weakness (generalized): Secondary | ICD-10-CM | POA: Diagnosis not present

## 2018-01-14 DIAGNOSIS — E872 Acidosis: Secondary | ICD-10-CM | POA: Diagnosis not present

## 2018-01-14 LAB — COMPREHENSIVE METABOLIC PANEL
ALK PHOS: 51 U/L (ref 38–126)
ALT: 26 U/L (ref 17–63)
AST: 23 U/L (ref 15–41)
Albumin: 2.7 g/dL — ABNORMAL LOW (ref 3.5–5.0)
Anion gap: 9 (ref 5–15)
BILIRUBIN TOTAL: 0.5 mg/dL (ref 0.3–1.2)
BUN: 15 mg/dL (ref 6–20)
CALCIUM: 9.2 mg/dL (ref 8.9–10.3)
CO2: 33 mmol/L — ABNORMAL HIGH (ref 22–32)
CREATININE: 1.32 mg/dL — AB (ref 0.61–1.24)
Chloride: 95 mmol/L — ABNORMAL LOW (ref 101–111)
Glucose, Bld: 80 mg/dL (ref 65–99)
Potassium: 4.1 mmol/L (ref 3.5–5.1)
Sodium: 137 mmol/L (ref 135–145)
TOTAL PROTEIN: 7.3 g/dL (ref 6.5–8.1)

## 2018-01-14 LAB — I-STAT ARTERIAL BLOOD GAS, ED
Acid-Base Excess: 7 mmol/L — ABNORMAL HIGH (ref 0.0–2.0)
BICARBONATE: 33.9 mmol/L — AB (ref 20.0–28.0)
O2 SAT: 92 %
PCO2 ART: 59.1 mmHg — AB (ref 32.0–48.0)
TCO2: 36 mmol/L — AB (ref 22–32)
pH, Arterial: 7.368 (ref 7.350–7.450)
pO2, Arterial: 68 mmHg — ABNORMAL LOW (ref 83.0–108.0)

## 2018-01-14 LAB — CBC WITH DIFFERENTIAL/PLATELET
Abs Immature Granulocytes: 0.4 10*3/uL — ABNORMAL HIGH (ref 0.0–0.1)
BASOS PCT: 1 %
Basophils Absolute: 0.1 10*3/uL (ref 0.0–0.1)
EOS ABS: 0.3 10*3/uL (ref 0.0–0.7)
Eosinophils Relative: 3 %
HEMATOCRIT: 28.2 % — AB (ref 39.0–52.0)
Hemoglobin: 8.1 g/dL — ABNORMAL LOW (ref 13.0–17.0)
IMMATURE GRANULOCYTES: 4 %
LYMPHS ABS: 2.8 10*3/uL (ref 0.7–4.0)
Lymphocytes Relative: 26 %
MCH: 27.8 pg (ref 26.0–34.0)
MCHC: 28.7 g/dL — AB (ref 30.0–36.0)
MCV: 96.9 fL (ref 78.0–100.0)
Monocytes Absolute: 2 10*3/uL — ABNORMAL HIGH (ref 0.1–1.0)
Monocytes Relative: 18 %
NEUTROS PCT: 48 %
Neutro Abs: 5.3 10*3/uL (ref 1.7–7.7)
PLATELETS: 284 10*3/uL (ref 150–400)
RBC: 2.91 MIL/uL — AB (ref 4.22–5.81)
RDW: 13.3 % (ref 11.5–15.5)
WBC: 10.9 10*3/uL — AB (ref 4.0–10.5)

## 2018-01-14 LAB — GLUCOSE, CAPILLARY: Glucose-Capillary: 122 mg/dL — ABNORMAL HIGH (ref 65–99)

## 2018-01-14 LAB — I-STAT TROPONIN, ED: Troponin i, poc: 0.01 ng/mL (ref 0.00–0.08)

## 2018-01-14 LAB — BRAIN NATRIURETIC PEPTIDE: B Natriuretic Peptide: 56.6 pg/mL (ref 0.0–100.0)

## 2018-01-14 LAB — PROCALCITONIN: PROCALCITONIN: 0.52 ng/mL

## 2018-01-14 LAB — I-STAT CG4 LACTIC ACID, ED
LACTIC ACID, VENOUS: 1.38 mmol/L (ref 0.5–1.9)
Lactic Acid, Venous: 1.1 mmol/L (ref 0.5–1.9)

## 2018-01-14 LAB — CBG MONITORING, ED: GLUCOSE-CAPILLARY: 82 mg/dL (ref 65–99)

## 2018-01-14 MED ORDER — OXYCODONE HCL 5 MG PO TABS
5.0000 mg | ORAL_TABLET | Freq: Three times a day (TID) | ORAL | Status: DC | PRN
  Administered 2018-01-14 – 2018-01-19 (×4): 5 mg via ORAL
  Filled 2018-01-14 (×4): qty 1

## 2018-01-14 MED ORDER — VANCOMYCIN HCL IN DEXTROSE 750-5 MG/150ML-% IV SOLN
750.0000 mg | Freq: Two times a day (BID) | INTRAVENOUS | Status: DC
  Administered 2018-01-15 – 2018-01-18 (×7): 750 mg via INTRAVENOUS
  Filled 2018-01-14 (×8): qty 150

## 2018-01-14 MED ORDER — POTASSIUM CHLORIDE CRYS ER 20 MEQ PO TBCR
20.0000 meq | EXTENDED_RELEASE_TABLET | Freq: Every day | ORAL | Status: DC
  Administered 2018-01-15 – 2018-01-19 (×5): 20 meq via ORAL
  Filled 2018-01-14 (×5): qty 1

## 2018-01-14 MED ORDER — LIDOCAINE HCL 2 % IJ SOLN
20.0000 mL | Freq: Once | INTRAMUSCULAR | Status: DC
  Filled 2018-01-14: qty 20

## 2018-01-14 MED ORDER — ALBUTEROL SULFATE (2.5 MG/3ML) 0.083% IN NEBU: 2.5000 mg | INHALATION_SOLUTION | RESPIRATORY_TRACT | Status: DC | PRN

## 2018-01-14 MED ORDER — LIDOCAINE HCL 2 % IJ SOLN: 20.0000 mL | Freq: Once | INTRAMUSCULAR | Status: DC

## 2018-01-14 MED ORDER — FUROSEMIDE 40 MG PO TABS
40.0000 mg | ORAL_TABLET | Freq: Two times a day (BID) | ORAL | Status: DC
  Administered 2018-01-14 – 2018-01-19 (×10): 40 mg via ORAL
  Filled 2018-01-14 (×10): qty 1

## 2018-01-14 MED ORDER — GUAIFENESIN ER 600 MG PO TB12
1200.0000 mg | ORAL_TABLET | Freq: Two times a day (BID) | ORAL | Status: DC
  Administered 2018-01-14 – 2018-01-19 (×10): 1200 mg via ORAL
  Filled 2018-01-14 (×10): qty 2

## 2018-01-14 MED ORDER — GABAPENTIN 300 MG PO CAPS
300.0000 mg | ORAL_CAPSULE | Freq: Three times a day (TID) | ORAL | Status: DC
  Administered 2018-01-14 – 2018-01-19 (×14): 300 mg via ORAL
  Filled 2018-01-14 (×14): qty 1

## 2018-01-14 MED ORDER — INSULIN ASPART 100 UNIT/ML ~~LOC~~ SOLN
0.0000 [IU] | Freq: Three times a day (TID) | SUBCUTANEOUS | Status: DC
  Administered 2018-01-16 (×2): 3 [IU] via SUBCUTANEOUS
  Administered 2018-01-17: 2 [IU] via SUBCUTANEOUS
  Administered 2018-01-18: 3 [IU] via SUBCUTANEOUS

## 2018-01-14 MED ORDER — TIOTROPIUM BROMIDE MONOHYDRATE 18 MCG IN CAPS
1.0000 | ORAL_CAPSULE | Freq: Every day | RESPIRATORY_TRACT | Status: DC
  Administered 2018-01-15 – 2018-01-19 (×5): 18 ug via RESPIRATORY_TRACT
  Filled 2018-01-14: qty 5

## 2018-01-14 MED ORDER — SODIUM CHLORIDE 0.9 % IV SOLN
1.0000 g | Freq: Once | INTRAVENOUS | Status: AC
  Administered 2018-01-14: 1 g via INTRAVENOUS
  Filled 2018-01-14: qty 10

## 2018-01-14 MED ORDER — AMIODARONE HCL 200 MG PO TABS
200.0000 mg | ORAL_TABLET | Freq: Every day | ORAL | Status: DC
  Administered 2018-01-15 – 2018-01-19 (×5): 200 mg via ORAL
  Filled 2018-01-14 (×5): qty 1

## 2018-01-14 MED ORDER — ISOSORBIDE MONONITRATE ER 30 MG PO TB24
30.0000 mg | ORAL_TABLET | Freq: Every day | ORAL | Status: DC
  Administered 2018-01-15 – 2018-01-19 (×5): 30 mg via ORAL
  Filled 2018-01-14 (×5): qty 1

## 2018-01-14 MED ORDER — APIXABAN 5 MG PO TABS
5.0000 mg | ORAL_TABLET | Freq: Two times a day (BID) | ORAL | Status: DC
  Administered 2018-01-14 – 2018-01-19 (×10): 5 mg via ORAL
  Filled 2018-01-14 (×10): qty 1

## 2018-01-14 MED ORDER — BICTEGRAVIR-EMTRICITAB-TENOFOV 50-200-25 MG PO TABS
1.0000 | ORAL_TABLET | Freq: Every day | ORAL | Status: DC
  Administered 2018-01-15 – 2018-01-19 (×5): 1 via ORAL
  Filled 2018-01-14 (×7): qty 1

## 2018-01-14 MED ORDER — DILTIAZEM HCL ER COATED BEADS 240 MG PO CP24
240.0000 mg | ORAL_CAPSULE | Freq: Every day | ORAL | Status: DC
  Administered 2018-01-15 – 2018-01-19 (×5): 240 mg via ORAL
  Filled 2018-01-14 (×5): qty 1

## 2018-01-14 MED ORDER — INSULIN DETEMIR 100 UNIT/ML ~~LOC~~ SOLN
30.0000 [IU] | Freq: Every day | SUBCUTANEOUS | Status: DC
  Administered 2018-01-14 – 2018-01-18 (×5): 30 [IU] via SUBCUTANEOUS
  Filled 2018-01-14 (×6): qty 0.3

## 2018-01-14 MED ORDER — AMITRIPTYLINE HCL 50 MG PO TABS
100.0000 mg | ORAL_TABLET | Freq: Every day | ORAL | Status: DC
  Administered 2018-01-14 – 2018-01-18 (×5): 100 mg via ORAL
  Filled 2018-01-14 (×6): qty 4

## 2018-01-14 MED ORDER — SODIUM CHLORIDE 0.9 % IV SOLN
1.0000 g | Freq: Three times a day (TID) | INTRAVENOUS | Status: DC
  Administered 2018-01-14 – 2018-01-19 (×15): 1 g via INTRAVENOUS
  Filled 2018-01-14 (×17): qty 1

## 2018-01-14 MED ORDER — VANCOMYCIN HCL 10 G IV SOLR
2000.0000 mg | Freq: Once | INTRAVENOUS | Status: AC
  Administered 2018-01-14: 2000 mg via INTRAVENOUS
  Filled 2018-01-14: qty 2000

## 2018-01-14 MED ORDER — HYDRALAZINE HCL 50 MG PO TABS
50.0000 mg | ORAL_TABLET | Freq: Three times a day (TID) | ORAL | Status: DC
  Administered 2018-01-14 – 2018-01-19 (×14): 50 mg via ORAL
  Filled 2018-01-14 (×15): qty 1

## 2018-01-14 MED ORDER — SODIUM CHLORIDE 0.9 % IV SOLN
INTRAVENOUS | Status: DC
  Administered 2018-01-14: 21:00:00 via INTRAVENOUS

## 2018-01-14 MED ORDER — MOMETASONE FURO-FORMOTEROL FUM 200-5 MCG/ACT IN AERO
2.0000 | INHALATION_SPRAY | Freq: Two times a day (BID) | RESPIRATORY_TRACT | Status: DC
  Administered 2018-01-14 – 2018-01-19 (×9): 2 via RESPIRATORY_TRACT
  Filled 2018-01-14: qty 8.8

## 2018-01-14 MED ORDER — SODIUM CHLORIDE 0.9 % IV SOLN: 500.0000 mg | Freq: Once | INTRAVENOUS | Status: DC

## 2018-01-14 NOTE — Progress Notes (Signed)
Pharmacy Antibiotic Note  Ruel FavorsJerome A Bayona [name] is a 52 y.o. male admitted on 01/14/2018 with pneumonia.  Pharmacy has been consulted for vancomycin dosing.  SCr 1.3 which appears to be around baseline for patient. Normalized CrCl ~65-8570mL./min. Patient has received 1 time doses of ceftriaxone and azithromycin in the ED.   Plan: Vancomycin 2g IV x1, then 750mg  IV q12h per obesity nomogram Follow for gram negative coverage  Follow c/s, clinical progression, renal function, level PRN  Height: 5\' 9"  (175.3 cm) Weight: 242 lb (109.8 kg) IBW/kg (Calculated) : 70.7  Temp (24hrs), Avg:98.7 F (37.1 C), Min:98.7 F (37.1 C), Max:98.7 F (37.1 C)  Recent Labs  Lab 01/14/18 1607 01/14/18 1627 01/14/18 1815  WBC 10.9*  --   --   CREATININE 1.32*  --   --   LATICACIDVEN  --  1.38 1.10    Estimated Creatinine Clearance: 80.8 mL/min (A) (by C-G formula based on SCr of 1.32 mg/dL (H)).    Allergies  Allergen Reactions  . Bactrim [Sulfamethoxazole-Trimethoprim] Hives    Antimicrobials this admission: Ceftriaxone 6/6 x1 Azithromycin 6/6 x1 Vancomycin 6/6 >>  Dose adjustments this admission: n/a  Microbiology results: 6/6 BCx:   Thank you for allowing pharmacy to be a part of this patient's care.  Henlee Donovan D. Christan Ciccarelli, PharmD, BCPS Clinical Pharmacist [phone] Please check AMION for all [facility] numbers 01/14/2018 7:22 PM
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
## 2018-01-14 NOTE — ED Triage Notes (Signed)
Patient dx with pneumonia 2 weeks ago and feels the same today. Per nursing home patient maxed out on his lasix.
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
## 2018-01-15 DIAGNOSIS — E1169 Type 2 diabetes mellitus with other specified complication: Secondary | ICD-10-CM

## 2018-01-15 DIAGNOSIS — E669 Obesity, unspecified: Secondary | ICD-10-CM

## 2018-01-15 LAB — CBC WITH DIFFERENTIAL/PLATELET
Abs Immature Granulocytes: 0.4 10*3/uL — ABNORMAL HIGH (ref 0.0–0.1)
BASOS ABS: 0 10*3/uL (ref 0.0–0.1)
BASOS PCT: 1 %
EOS ABS: 0.3 10*3/uL (ref 0.0–0.7)
Eosinophils Relative: 4 %
HCT: 25.5 % — ABNORMAL LOW (ref 39.0–52.0)
HEMOGLOBIN: 7.4 g/dL — AB (ref 13.0–17.0)
Immature Granulocytes: 5 %
LYMPHS PCT: 23 %
Lymphs Abs: 1.9 10*3/uL (ref 0.7–4.0)
MCH: 28.1 pg (ref 26.0–34.0)
MCHC: 29 g/dL — AB (ref 30.0–36.0)
MCV: 97 fL (ref 78.0–100.0)
MONO ABS: 1.4 10*3/uL — AB (ref 0.1–1.0)
Monocytes Relative: 17 %
Neutro Abs: 4.3 10*3/uL (ref 1.7–7.7)
Neutrophils Relative %: 52 %
PLATELETS: 261 10*3/uL (ref 150–400)
RBC: 2.63 MIL/uL — AB (ref 4.22–5.81)
RDW: 13.4 % (ref 11.5–15.5)
WBC: 8.4 10*3/uL (ref 4.0–10.5)

## 2018-01-15 LAB — BASIC METABOLIC PANEL
Anion gap: 6 (ref 5–15)
BUN: 13 mg/dL (ref 6–20)
CALCIUM: 8.9 mg/dL (ref 8.9–10.3)
CO2: 35 mmol/L — ABNORMAL HIGH (ref 22–32)
Chloride: 99 mmol/L — ABNORMAL LOW (ref 101–111)
Creatinine, Ser: 1.23 mg/dL (ref 0.61–1.24)
GFR calc Af Amer: >60 mL/min (ref >60)
Glucose, Bld: 93 mg/dL (ref 65–99)
POTASSIUM: 3.9 mmol/L (ref 3.5–5.1)
SODIUM: 140 mmol/L (ref 135–145)

## 2018-01-15 LAB — GLUCOSE, CAPILLARY
GLUCOSE-CAPILLARY: 67 mg/dL (ref 65–99)
Glucose-Capillary: 136 mg/dL — ABNORMAL HIGH (ref 65–99)
Glucose-Capillary: 112 mg/dL — ABNORMAL HIGH (ref 65–99)
Glucose-Capillary: 90 mg/dL (ref 65–99)
Glucose-Capillary: 149 mg/dL — ABNORMAL HIGH (ref 65–99)

## 2018-01-15 LAB — PREPARE RBC (CROSSMATCH)

## 2018-01-15 MED ORDER — ALUM & MAG HYDROXIDE-SIMETH 200-200-20 MG/5ML PO SUSP
30.0000 mL | ORAL | Status: DC | PRN
  Administered 2018-01-15 – 2018-01-17 (×2): 30 mL via ORAL
  Filled 2018-01-15 (×2): qty 30

## 2018-01-15 MED ORDER — IPRATROPIUM-ALBUTEROL 0.5-2.5 (3) MG/3ML IN SOLN
RESPIRATORY_TRACT | Status: AC
  Administered 2018-01-15: 3 mL
  Filled 2018-01-15: qty 3

## 2018-01-15 MED ORDER — LIP MEDEX EX OINT
1.0000 "application " | TOPICAL_OINTMENT | CUTANEOUS | Status: DC | PRN
  Administered 2018-01-15: 1 via TOPICAL
  Filled 2018-01-15: qty 7

## 2018-01-15 MED ORDER — ORAL CARE MOUTH RINSE
15.0000 mL | Freq: Two times a day (BID) | OROMUCOSAL | Status: DC
  Administered 2018-01-15 – 2018-01-19 (×3): 15 mL via OROMUCOSAL

## 2018-01-15 MED ORDER — SODIUM CHLORIDE 0.9 % IV SOLN
Freq: Once | INTRAVENOUS | Status: AC
  Administered 2018-01-15: 15:00:00 via INTRAVENOUS

## 2018-01-15 NOTE — Consult Note (Signed)
   [facility]   01/15/2018  Chris FavorsJerome A Kiser Jr. 09/10/1965 [id number]     Chris [name]. [facility].  Spoke briefly at bedside with Chris [name] about [facility]. Chris [name] agreeable. However, he voiced that his main concern is to [facility]. Made him aware that PT is pending.  Spoke with (covering) inpatient RNCM to make aware [facility].  Will continue to follow for disposition plans and appropriate [facility] referrals.    Raiford NobleAtika Dorthey Depace, MSN-Ed, RN,BSN [facility] Management Hospital Liaison [phone]

## 2018-01-15 NOTE — Progress Notes (Signed)
PROGRESS NOTE    Chris RiversJerome A Poncedeleon Jr.  ZOX:[id number] DOB: 07/09/1966 DOA: 01/14/2018 PCP: Claiborne RiggFleming, Zelda W, NP    Brief Narrative: Chris FavorsJerome A Lippe [name] is a 52 y.o. male with medical history significant of acute on chronic respiratory failure, [facility], with history of COPD requiring as needed home oxygen, essential hypertension, chronic diastolic CHF ejection fraction 60%, HIV positive (CD4=60 12/15/2017), diabetes mellitus type 2, tobacco use, paroxysmal atrial fibrillation on Eliquis, recent influenza induced ARDS requiring tracheostomy intubation presenting with SOB.  [facility] for hypoxia (NOS) and PNA    Assessment & Plan:   Principal Problem:   Acute on chronic respiratory failure with hypoxia (HCC) Active Problems:   Cyst (solitary) of breast   Essential hypertension, benign   Diabetes mellitus type 2 in obese (HCC)   Atrial fibrillation (HCC)   HIV (human immunodeficiency virus infection) (HCC)   HCAP (healthcare-associated pneumonia)  Acute on chronic respiratory failure with hypoxia secondary to persistent health care associated pneumonia.  Admitted for IV antibiotics, urine for strep antigen ordered.  Blood cultures ordered and pending.  Continue with IV antibiotics for another 24 hours and work with PT and plan for discharge in the 24 hours.     Hypertension:  Well controlled.   Atrial fibrillation:  Rate controlled with cardizem and amiodarone and anti coagulation with eliquis     COPD:  Resume spiriva and duonebs as needed.  Resume dulera.    Chronic diastolic heart failure :  Resume lasix .  Strict intake and output  Daily weights.     H/O HIV: Resume home meds.   Anemia of chronic disease:  Hemoglobin is 7.4 today, possibly from hemodilution.  1 unit of prbc transfusion ordered.  Stop the IV fluids.    Diabetes mellitus:  CBG (last 3)  Recent Labs    01/15/18 0800 01/15/18 0831 01/15/18 1149    GLUCAP 67 136* 112*   Controlled . Resume levemir and SSI.    DVT prophylaxis: eliquis.  Code Status: (Full/Partial - specify details) Family Communication: none at bedside.  Disposition Plan: pending PT evaluation.    Consultants:   None.    Procedures: none.    Antimicrobials:vancomycin and cefepime since admission.    Subjective: [facility] to [facility].   Objective: Vitals:   01/15/18 0644 01/15/18 0816 01/15/18 0817 01/15/18 0839  BP: 120/73   120/64  Pulse:    (!) 105  Resp:    20  Temp:    98.6 F (37 C)  TempSrc:    Oral  SpO2:  90% 90% 94%  Weight:      Height:        Intake/Output Summary (Last 24 hours) at 01/15/2018 1334 Last data filed at 01/15/2018 1000 Gross per 24 hour  Intake 1449.5 ml  Output 500 ml  Net 949.5 ml   Filed Weights   01/14/18 1523 01/14/18 2146  Weight: 109.8 kg (242 lb) 105.8 kg (233 lb 4 oz)    Examination:  General exam: Appears calm and comfortable  Respiratory system: Clear to auscultation. Respiratory effort normal. Cardiovascular system: S1 & S2 heard, tachycardic.  No pedal edema. Gastrointestinal system: Abdomen is nondistended, soft and nontender. No organomegaly or masses felt. Normal bowel sounds heard. Central nervous system: Alert and oriented. No focal neurological deficits. Extremities: Symmetric 5 x 5 power. Skin: No rashes, lesions or ulcers Psychiatry: Mood & affect appropriate.  Data Reviewed: I have personally reviewed following labs and imaging studies  CBC: Recent Labs  Lab 01/14/18 1607 01/15/18 0241  WBC 10.9* 8.4  NEUTROABS 5.3 4.3  HGB 8.1* 7.4*  HCT 28.2* 25.5*  MCV 96.9 97.0  PLT 284 261   Basic Metabolic Panel: Recent Labs  Lab 01/14/18 1607 01/15/18 0241  NA 137 140  K 4.1 3.9  CL 95* 99*  CO2 33* 35*  GLUCOSE 80 93  BUN 15 13  CREATININE 1.32* 1.23  CALCIUM 9.2 8.9   GFR: Estimated Creatinine Clearance: 85.1 mL/min (by C-G formula based on SCr of 1.23  mg/dL). Liver Function Tests: Recent Labs  Lab 01/14/18 1607  AST 23  ALT 26  ALKPHOS 51  BILITOT 0.5  PROT 7.3  ALBUMIN 2.7*   No results for input(s): LIPASE, AMYLASE in the last 168 hours. No results for input(s): AMMONIA in the last 168 hours. Coagulation Profile: No results for input(s): INR, PROTIME in the last 168 hours. Cardiac Enzymes: No results for input(s): CKTOTAL, CKMB, CKMBINDEX, TROPONINI in the last 168 hours. BNP (last 3 results) No results for input(s): PROBNP in the last 8760 hours. HbA1C: No results for input(s): HGBA1C in the last 72 hours. CBG: Recent Labs  Lab 01/14/18 1804 01/14/18 2153 01/15/18 0800 01/15/18 0831 01/15/18 1149  GLUCAP 82 122* 67 136* 112*   Lipid Profile: No results for input(s): CHOL, HDL, LDLCALC, TRIG, CHOLHDL, LDLDIRECT in the last 72 hours. Thyroid Function Tests: No results for input(s): TSH, T4TOTAL, FREET4, T3FREE, THYROIDAB in the last 72 hours. Anemia Panel: No results for input(s): VITAMINB12, FOLATE, FERRITIN, TIBC, IRON, RETICCTPCT in the last 72 hours. Sepsis Labs: Recent Labs  Lab 01/14/18 1627 01/14/18 1815 01/14/18 2110  PROCALCITON  --   --  0.52  LATICACIDVEN 1.38 1.10  --     No results found for this or any previous visit (from the past 240 hour(s)).       Radiology Studies: Dg Chest 2 View  Result Date: 01/14/2018 CLINICAL DATA:  Pneumonia for 2 weeks. EXAM: CHEST - 2 VIEW COMPARISON:  Dec 21, 2017 FINDINGS: No pneumothorax. The opacity in the medial right lung base previously has resolved. There is mild patchy opacity in left base. Cardiomegaly. The hila and mediastinum are normal. No other acute abnormalities. IMPRESSION: Patchy left basilar infiltrate.  Recommend follow-up to resolution. Electronically Signed   By: Gerome Sam III M.D   On: 01/14/2018 17:00        Scheduled Meds: . amiodarone  200 mg Oral Daily  . amitriptyline  100 mg Oral QHS  . apixaban  5 mg Oral BID  .  bictegravir-emtricitabine-tenofovir AF  1 tablet Oral Q1500  . diltiazem  240 mg Oral Daily  . furosemide  40 mg Oral BID  . gabapentin  300 mg Oral TID  . guaiFENesin  1,200 mg Oral BID  . hydrALAZINE  50 mg Oral Q8H  . insulin aspart  0-15 Units Subcutaneous TID WC  . insulin detemir  30 Units Subcutaneous Q2200  . isosorbide mononitrate  30 mg Oral Daily  . lidocaine  20 mL Intradermal Once  . mouth rinse  15 mL Mouth Rinse BID  . mometasone-formoterol  2 puff Inhalation BID  . potassium chloride SA  20 mEq Oral Daily  . tiotropium  1 capsule Inhalation Daily   Continuous Infusions: . sodium chloride 75 mL/hr at 01/14/18 2042  . ceFEPime (MAXIPIME) IV Stopped (01/15/18 1610)  . vancomycin Stopped (  01/15/18 0744)     LOS: 1 day    Time spent: 35 minutes    Kathlen Mody, MD [facility] Pager [phone]  If 7PM-7AM, please contact night-coverage [email or web address] Password [email or web address] 01/15/2018, 1:34 PM

## 2018-01-15 NOTE — Progress Notes (Signed)
Patient educated and informed about blood transfusion. Patient reported understanding and obtained informed consent.

## 2018-01-15 NOTE — Progress Notes (Signed)
Pt refused CPAP. Educated pt to call if changed his mind.

## 2018-01-15 NOTE — Care Management Note (Signed)
Case Management Note  Patient Details  Name: [name] MRN: [id number] Date of Birth: 05/26/1966  Subjective/Objective:   Pt admitted with PNA - pt has had frequent hospitalizations                 Action/Plan:  [facility] - CSW consulted.   [facility] however experienced set back and [facility]  PT eval will be ordered when stable.  CM will continue to follow for discharge needs    Expected Discharge Date:                  Expected Discharge Plan:  Home w Home Health Services  In-House Referral:  Clinical Social Work  Discharge planning Services  CM Consult  Post Acute Care Choice:    Choice offered to:     DME Arranged:    DME Agency:     HH Arranged:    HH Agency:     Status of Service:     If discussed at [employer] Meetings, dates discussed:    Additional Comments:  Cherylann ParrClaxton, Darrel Gloss S, RN 01/15/2018, 8:52 AM

## 2018-01-16 DIAGNOSIS — R69 Illness, unspecified: Secondary | ICD-10-CM | POA: Diagnosis not present

## 2018-01-16 DIAGNOSIS — B370 Candidal stomatitis: Secondary | ICD-10-CM | POA: Diagnosis not present

## 2018-01-16 DIAGNOSIS — R2689 Other abnormalities of gait and mobility: Secondary | ICD-10-CM | POA: Diagnosis not present

## 2018-01-16 DIAGNOSIS — E1169 Type 2 diabetes mellitus with other specified complication: Secondary | ICD-10-CM | POA: Diagnosis not present

## 2018-01-16 DIAGNOSIS — I10 Essential (primary) hypertension: Secondary | ICD-10-CM | POA: Diagnosis not present

## 2018-01-16 DIAGNOSIS — R0602 Shortness of breath: Secondary | ICD-10-CM | POA: Diagnosis not present

## 2018-01-16 DIAGNOSIS — J449 Chronic obstructive pulmonary disease, unspecified: Secondary | ICD-10-CM | POA: Diagnosis not present

## 2018-01-16 DIAGNOSIS — M6281 Muscle weakness (generalized): Secondary | ICD-10-CM | POA: Diagnosis not present

## 2018-01-16 DIAGNOSIS — R498 Other voice and resonance disorders: Secondary | ICD-10-CM | POA: Diagnosis not present

## 2018-01-16 DIAGNOSIS — J189 Pneumonia, unspecified organism: Secondary | ICD-10-CM | POA: Diagnosis not present

## 2018-01-16 DIAGNOSIS — J9621 Acute and chronic respiratory failure with hypoxia: Secondary | ICD-10-CM | POA: Diagnosis not present

## 2018-01-16 DIAGNOSIS — R41841 Cognitive communication deficit: Secondary | ICD-10-CM | POA: Diagnosis not present

## 2018-01-16 DIAGNOSIS — I480 Paroxysmal atrial fibrillation: Secondary | ICD-10-CM | POA: Diagnosis not present

## 2018-01-16 DIAGNOSIS — E872 Acidosis: Secondary | ICD-10-CM | POA: Diagnosis not present

## 2018-01-16 DIAGNOSIS — R1312 Dysphagia, oropharyngeal phase: Secondary | ICD-10-CM | POA: Diagnosis not present

## 2018-01-16 DIAGNOSIS — E669 Obesity, unspecified: Secondary | ICD-10-CM | POA: Diagnosis not present

## 2018-01-16 LAB — TYPE AND SCREEN
ABO/RH(D): A POS
ANTIBODY SCREEN: NEGATIVE
Unit division: 0

## 2018-01-16 LAB — BPAM RBC
Blood Product Expiration Date: 201907012359
ISSUE DATE / TIME: 201906071743
Unit Type and Rh: 6200

## 2018-01-16 LAB — GLUCOSE, CAPILLARY
GLUCOSE-CAPILLARY: 197 mg/dL — AB (ref 65–99)
Glucose-Capillary: 155 mg/dL — ABNORMAL HIGH (ref 65–99)
Glucose-Capillary: 94 mg/dL (ref 65–99)

## 2018-01-16 LAB — HEMOGLOBIN AND HEMATOCRIT, BLOOD
HCT: 29.9 % — ABNORMAL LOW (ref 39.0–52.0)
Hemoglobin: 9 g/dL — ABNORMAL LOW (ref 13.0–17.0)

## 2018-01-16 MED ORDER — ONDANSETRON HCL 4 MG/2ML IJ SOLN
4.0000 mg | Freq: Four times a day (QID) | INTRAMUSCULAR | Status: DC | PRN
  Administered 2018-01-16: 4 mg via INTRAVENOUS
  Filled 2018-01-16: qty 2

## 2018-01-16 NOTE — Evaluation (Signed)
Physical Therapy Evaluation Patient Details Name: Chris [name]. MRN: [id number] DOB: 1966/02/06 Today's Date: 01/16/2018   History of Present Illness  52 y.o. male with medical history significant of acute on chronic respiratory failure, discharged on 12/26/2017 to [facility], with history of COPD requiring as needed home oxygen, essential hypertension, chronic diastolic CHF ejection fraction 60%, HIV positive (CD4=60 12/15/2017), diabetes mellitus type 2, tobacco use, paroxysmal atrial fibrillation on Eliquis, recent influenza induced ARDS requiring tracheostomy intubation presenting with SOB.  He was sent from [facility] for hypoxia (NOS) and PNA.     Clinical Impression  Pt admitted with above diagnosis. Pt currently with functional limitations due to the deficits listed below (see PT Problem List). Although appears pt was soon to be discharged from SNF to home with HHPT, he currently requires 2 person assist for safe ambulation. Anticipate plan will be to discharge home at a wheelchair level (although pt's goal is to be walking--due to asterixis-like loss of muscle contraction causing knee buckling and then return of contraction prior to complete fall--do not anticipate this is realistic. Pt will benefit from skilled PT to increase their independence and safety with mobility to allow discharge to the venue listed below.       Follow Up Recommendations SNF;Supervision/Assistance - 24 hour    Equipment Recommendations  None recommended by PT    Recommendations for Other Services OT consult(per SW note, needs OT eval for return to SNF)     Precautions / Restrictions Precautions Precautions: Fall Precaution Comments: legs give out unexpectedly Restrictions Weight Bearing Restrictions: No      Mobility  Bed Mobility         Supine to sit: Min guard     General bed mobility comments: began having ?tremors ?asterixis as coming to sit  Transfers Overall transfer level: Needs  assistance Equipment used: None Transfers: Stand Pivot Transfers   Stand pivot transfers: Min guard       General transfer comment: chair directly beside bed; pivot to his right with pt holding onto armrest  Ambulation/Gait             General Gait Details: NT due to need +2 for safety  Stairs            Wheelchair Mobility    Modified Rankin (Stroke Patients Only)       Balance   Sitting-balance support: No upper extremity supported;Feet supported Sitting balance-Leahy Scale: Fair     Standing balance support: Single extremity supported Standing balance-Leahy Scale: Poor Standing balance comment: legs with loss of contraction/collapse and then contraction resumes                             Pertinent Vitals/Pain Pain Assessment: No/denies pain    Home Living Family/patient expects to be discharged to:: Skilled nursing facility                 Additional Comments: Discussed with pt pending d/c from SNF 6/10 per chart and he wants to return to [facility]; does not feel ready to d/c home    Prior Function Level of Independence: Needs assistance   Gait / Transfers Assistance Needed: at SNF walking with +2 assist (second person close follow with w/c due to knees unexpectedly give out)           Hand Dominance        Extremity/Trunk Assessment   Upper Extremity Assessment Upper  Extremity Assessment: Generalized weakness(bil UEs grossly 4/5)    Lower Extremity Assessment Lower Extremity Assessment: Generalized weakness;RLE deficits/detail;LLE deficits/detail RLE Deficits / Details: hip flexion 3+; knee extension 3+, ankle DF 5/5 LLE Deficits / Details: hip flexion 3+; knee extension 3+, ankle DF 5/5       Communication   Communication: Other (comment)(low volume)  Cognition Arousal/Alertness: Awake/alert Behavior During Therapy: WFL for tasks assessed/performed Overall Cognitive Status: No family/caregiver present to  determine baseline cognitive functioning                                        General Comments      Exercises     Assessment/Plan    PT Assessment Patient needs continued PT services  PT Problem List Decreased strength;Decreased activity tolerance;Decreased balance;Decreased mobility;Cardiopulmonary status limiting activity;Decreased knowledge of use of DME       PT Treatment Interventions DME instruction;Gait training;Functional mobility training;Therapeutic activities;Therapeutic exercise;Balance training;Cognitive remediation;Patient/family education    PT Goals (Current goals can be found in the Care Plan section)  Acute Rehab PT Goals Patient Stated Goal: [facility] he [facility] get therapy 5days/week PT Goal Formulation: With patient Time For Goal Achievement: 01/30/18 Potential to Achieve Goals: Fair    Frequency Min 2X/week   Barriers to discharge        Co-evaluation               AM-PAC PT "6 Clicks" Daily Activity  Outcome Measure Difficulty turning over in bed (including adjusting bedclothes, sheets and blankets)?: None Difficulty moving from lying on back to sitting on the side of the bed? : A Little Difficulty sitting down on and standing up from a chair with arms (e.g., wheelchair, bedside commode, etc,.)?: A Little Help needed moving to and from a bed to chair (including a wheelchair)?: A Little Help needed walking in hospital room?: A Lot Help needed climbing 3-5 steps with a railing? : Total 6 Click Score: 16    End of Session Equipment Utilized During Treatment: Gait belt;Oxygen Activity Tolerance: Patient limited by fatigue Patient left: in chair;with call bell/phone within reach;with chair alarm set Nurse Communication: Mobility status PT Visit Diagnosis: Unsteadiness on feet (R26.81);Other abnormalities of gait and mobility (R26.89);Muscle weakness (generalized) (M62.81);Difficulty in walking, not elsewhere  classified (R26.2)    Time: [id number] PT Time Calculation (min) (ACUTE ONLY): 27 min   Charges:   PT Evaluation $PT Eval Moderate Complexity: 1 Mod     PT G Codes:          [employer] CorporationLynn P Bonita [name], PT 01/16/2018, 3:34 PM

## 2018-01-16 NOTE — Progress Notes (Signed)
PROGRESS NOTE    Chris [name].  ZOX:[id number] DOB: October 28, 1965 DOA: 01/14/2018 PCP: Claiborne Rigg, NP    Brief Narrative: Chris [name]. is a 52 y.o. male with medical history significant of acute on chronic respiratory failure, discharged on 12/26/2017 to [facility], with history of COPD requiring as needed home oxygen, essential hypertension, chronic diastolic CHF ejection fraction 60%, HIV positive (CD4=60 12/15/2017), diabetes mellitus type 2, tobacco use, paroxysmal atrial fibrillation on Eliquis, recent influenza induced ARDS requiring tracheostomy intubation presenting with SOB.  He was sent from [facility] for hypoxia (NOS) and PNA. Pt requesting to be transferred to [facility].     Assessment & Plan:   Principal Problem:   Acute on chronic respiratory failure with hypoxia (HCC) Active Problems:   Cyst (solitary) of breast   Essential hypertension, benign   Diabetes mellitus type 2 in obese (HCC)   Atrial fibrillation (HCC)   HIV (human immunodeficiency virus infection) (HCC)   HCAP (healthcare-associated pneumonia)  Acute on chronic respiratory failure with hypoxia secondary to persistent health care associated pneumonia.  Admitted for IV antibiotics, urine for strep antigen ordered. Pt on 3 lit of [place] oxygen.  Blood cultures ordered and pending, so far negative.  Continue with IV antibiotics for now.  Request PT and OT consultations.     Hypertension:  Well controlled.   Atrial fibrillation:  Rate controlled with cardizem and amiodarone and anti coagulation with eliquis .    COPD:  Resume spiriva and duonebs as needed.  Resume dulera. No wheezing hear today.    Chronic diastolic heart failure : he appears to be compensated.  Resume lasix .  Strict intake and output  Daily weights.     H/O HIV: Resume home meds.   Anemia of chronic disease:  Hemoglobin is 7.4 today, possibly from hemodilution.  1 unit of prbc transfusion ordered.  Repeat  hemoglobin is around 9.    Diabetes mellitus:  CBG (last 3)  Recent Labs    01/15/18 2128 01/16/18 0816 01/16/18 1203  GLUCAP 149* 155* 94   Controlled . Resume levemir and SSI.     DVT prophylaxis: eliquis.  Code Status:  Full code.  Family Communication: none at bedside.  Disposition Plan: pending PT evaluation. Possibly back to SNF WHEN bed available.    Consultants:   None.    Procedures: none.    Antimicrobials:vancomycin and cefepime since admission.    Subjective: No chest pain or sob.  Breathing the same.   Objective: Vitals:   01/16/18 0636 01/16/18 0800 01/16/18 0819 01/16/18 1205  BP: 119/66  (!) 144/81 136/74  Pulse: 93  94 93  Resp: 18  17 18   Temp: 98.3 F (36.8 C)   98.4 F (36.9 C)  TempSrc: Oral   Oral  SpO2:  95% 98% 100%  Weight:      Height:        Intake/Output Summary (Last 24 hours) at 01/16/2018 1614 Last data filed at 01/16/2018 1213 Gross per 24 hour  Intake 815 ml  Output 1250 ml  Net -435 ml   Filed Weights   01/14/18 1523 01/14/18 2146  Weight: 109.8 kg (242 lb) 105.8 kg (233 lb 4 oz)    Examination:  General exam: Appears calm and comfortable not in distress. On [place] oxygen.  Respiratory system: Clear to auscultation. Respiratory effort normal. No wheezing  Cardiovascular system: S1 & S2 heard, tachycardic.  No pedal edema. Gastrointestinal system: Abdomen is soft  NT ND BS+ Central nervous system: Alert and oriented. NON focal.  Extremities: Symmetric 5 x 5 power. Skin: No rashes, lesions or ulcers Psychiatry: Mood & affect appropriate.     Data Reviewed: I have personally reviewed following labs and imaging studies  CBC: Recent Labs  Lab 01/14/18 1607 01/15/18 0241 01/16/18 0916  WBC 10.9* 8.4  --   NEUTROABS 5.3 4.3  --   HGB 8.1* 7.4* 9.0*  HCT 28.2* 25.5* 29.9*  MCV 96.9 97.0  --   PLT 284 261  --    Basic Metabolic Panel: Recent Labs  Lab 01/14/18 1607 01/15/18 0241  NA 137 140  K 4.1 3.9    CL 95* 99*  CO2 33* 35*  GLUCOSE 80 93  BUN 15 13  CREATININE 1.32* 1.23  CALCIUM 9.2 8.9   GFR: Estimated Creatinine Clearance: 85.1 mL/min (by C-G formula based on SCr of 1.23 mg/dL). Liver Function Tests: Recent Labs  Lab 01/14/18 1607  AST 23  ALT 26  ALKPHOS 51  BILITOT 0.5  PROT 7.3  ALBUMIN 2.7*   No results for input(s): LIPASE, AMYLASE in the last 168 hours. No results for input(s): AMMONIA in the last 168 hours. Coagulation Profile: No results for input(s): INR, PROTIME in the last 168 hours. Cardiac Enzymes: No results for input(s): CKTOTAL, CKMB, CKMBINDEX, TROPONINI in the last 168 hours. BNP (last 3 results) No results for input(s): PROBNP in the last 8760 hours. HbA1C: No results for input(s): HGBA1C in the last 72 hours. CBG: Recent Labs  Lab 01/15/18 1149 01/15/18 1724 01/15/18 2128 01/16/18 0816 01/16/18 1203  GLUCAP 112* 90 149* 155* 94   Lipid Profile: No results for input(s): CHOL, HDL, LDLCALC, TRIG, CHOLHDL, LDLDIRECT in the last 72 hours. Thyroid Function Tests: No results for input(s): TSH, T4TOTAL, FREET4, T3FREE, THYROIDAB in the last 72 hours. Anemia Panel: No results for input(s): VITAMINB12, FOLATE, FERRITIN, TIBC, IRON, RETICCTPCT in the last 72 hours. Sepsis Labs: Recent Labs  Lab 01/14/18 1627 01/14/18 1815 01/14/18 2110  PROCALCITON  --   --  0.52  LATICACIDVEN 1.38 1.10  --     Recent Results (from the past 240 hour(s))  Culture, blood (Routine X 2) w Reflex to ID Panel     Status: None (Preliminary result)   Collection Time: 01/14/18  6:10 PM  Result Value Ref Range Status   Specimen Description BLOOD LEFT HAND  Final   Special Requests   Final    BOTTLES DRAWN AEROBIC ONLY Blood Culture results may not be optimal due to an inadequate volume of blood received in culture bottles   Culture   Final    NO GROWTH 2 DAYS Performed at [facility], [address] [place], [place] [address]    Report Status  PENDING  Incomplete  Culture, blood (Routine X 2) w Reflex to ID Panel     Status: None (Preliminary result)   Collection Time: 01/14/18  6:14 PM  Result Value Ref Range Status   Specimen Description BLOOD RIGHT HAND  Final   Special Requests   Final    BOTTLES DRAWN AEROBIC ONLY Blood Culture results may not be optimal due to an inadequate volume of blood received in culture bottles   Culture   Final    NO GROWTH 2 DAYS Performed at [facility], [address], [place], [place] [address]    Report Status PENDING  Incomplete         Radiology  Studies: Dg Chest 2 View  Result Date: 01/14/2018 CLINICAL DATA:  Pneumonia for 2 weeks. EXAM: CHEST - 2 VIEW COMPARISON:  Dec 21, 2017 FINDINGS: No pneumothorax. The opacity in the medial right lung base previously has resolved. There is mild patchy opacity in left base. Cardiomegaly. The hila and mediastinum are normal. No other acute abnormalities. IMPRESSION: Patchy left basilar infiltrate.  Recommend follow-up to resolution. Electronically Signed   By: Gerome Samavid  Williams III M.D   On: 01/14/2018 17:00        Scheduled Meds: . amiodarone  200 mg Oral Daily  . amitriptyline  100 mg Oral QHS  . apixaban  5 mg Oral BID  . bictegravir-emtricitabine-tenofovir AF  1 tablet Oral Q1500  . diltiazem  240 mg Oral Daily  . furosemide  40 mg Oral BID  . gabapentin  300 mg Oral TID  . guaiFENesin  1,200 mg Oral BID  . hydrALAZINE  50 mg Oral Q8H  . insulin aspart  0-15 Units Subcutaneous TID WC  . insulin detemir  30 Units Subcutaneous Q2200  . isosorbide mononitrate  30 mg Oral Daily  . lidocaine  20 mL Intradermal Once  . mouth rinse  15 mL Mouth Rinse BID  . mometasone-formoterol  2 puff Inhalation BID  . potassium chloride SA  20 mEq Oral Daily  . tiotropium  1 capsule Inhalation Daily   Continuous Infusions: . ceFEPime (MAXIPIME) IV Stopped (01/16/18 1602)  . vancomycin Stopped (01/16/18 0752)     LOS: 2 days    Time spent:  35 minutes    Kathlen ModyVijaya Tyniah Kastens, MD [facility] Pager [phone]  If 7PM-7AM, please contact night-coverage [email or web address] Password [email or web address] 01/16/2018, 4:14 PM

## 2018-01-16 NOTE — Social Work (Signed)
CSW [facility] with [facility], pt will need new PT/OT evaluations, and insurance authorization in order to discharge [facility]. They will begin authorization when evaluations are available.  Doy HutchingIsabel H Kathryn Linarez, LCSWA [facility] Clinical Social Work [phone]

## 2018-01-17 LAB — GLUCOSE, CAPILLARY
Glucose-Capillary: 147 mg/dL — ABNORMAL HIGH (ref 65–99)
Glucose-Capillary: 99 mg/dL (ref 65–99)

## 2018-01-17 MED ORDER — BISACODYL 5 MG PO TBEC
5.0000 mg | DELAYED_RELEASE_TABLET | Freq: Once | ORAL | Status: AC
  Administered 2018-01-17: 5 mg via ORAL
  Filled 2018-01-17: qty 1

## 2018-01-17 MED ORDER — POLYETHYLENE GLYCOL 3350 17 G PO PACK
17.0000 g | PACK | Freq: Every day | ORAL | Status: DC | PRN
  Administered 2018-01-18 – 2018-01-19 (×2): 17 g via ORAL
  Filled 2018-01-17 (×3): qty 1

## 2018-01-17 NOTE — Progress Notes (Signed)
Pharmacy Antibiotic Note  Chris FavorsJerome A Brozowski [name] is a 52 y.o. male admitted on 01/14/2018 with pneumonia.  Pharmacy has been consulted for vancomycin dosing.  SCr 1.3>1.23 which appears to be around baseline for patient (normCrCl ~65-6670mL/min). WBC 10.9>8.4, afebrile, PCT 0.52, LA 1.1. Currently on day 4 of IV vancomycin/cefepime- per Internal Medicine notes will consider changing to oral antibiotic tomorrow >> will defer vancomycin trough given plan (if would consider beyond 24 hours, will need level). BCx currently NGTD.  Patient received 1 time doses of ceftriaxone and azithromycin in the ED.   Plan: Continue vancomycin 750mg  IV q12h Continue cefepime 1 g IV q8 Follow c/s, clinical progression, renal function, level PRN  Height: 5\' 9"  (175.3 cm) Weight: 233 lb 4 oz (105.8 kg) IBW/kg (Calculated) : 70.7  Temp (24hrs), Avg:98.3 F (36.8 C), Min:98.2 F (36.8 C), Max:98.5 F (36.9 C)  Recent Labs  Lab 01/14/18 1607 01/14/18 1627 01/14/18 1815 01/15/18 0241  WBC 10.9*  --   --  8.4  CREATININE 1.32*  --   --  1.23  LATICACIDVEN  --  1.38 1.10  --     Estimated Creatinine Clearance: 85.1 mL/min (by C-G formula based on SCr of 1.23 mg/dL).    Allergies  Allergen Reactions  . Bactrim [Sulfamethoxazole-Trimethoprim] Hives    Antimicrobials this admission: Ceftriaxone 6/6 x1 Azithromycin 6/6 x1 Vancomycin 6/6 >> Cefepime 6/6>>  Dose adjustments this admission: N/A  Microbiology results: 6/6 BCx: NGTD  Thank you for allowing pharmacy to be a part of this patient's care.  Girard CooterKimberly Perkins, PharmD Clinical Pharmacist  Pager: [phone] Phone: [phone] 01/17/2018 12:03 PM

## 2018-01-17 NOTE — Progress Notes (Signed)
OT Cancellation Note  Patient Details Name: Douglass RiversJerome A Hausner Jr. MRN: [id number] DOB: 09/19/1965   Cancelled Treatment:    Reason Eval/Treat Not Completed: Patient declined, no reason specified. Pt declined to participate despite maximum encouragement and education concerning benefits of participation with therapies. He continuously reported "I am not doing anything." Will check back as able. Note need for OT evaluation to facilitate discharge to SNF.   Doristine Sectionharity A Kirill Chatterjee, MS OTR/L  Pager: [phone]   Celes Dedic A Astaria Nanez 01/17/2018, 10:25 AM

## 2018-01-17 NOTE — Evaluation (Signed)
Occupational Therapy Evaluation Patient Details Name: Chris RiversJerome A Sherard Jr. MRN: [id number] DOB: 12/13/1965 Today's Date: 01/17/2018    History of Present Illness 52 y.o. male with medical history significant of acute on chronic respiratory failure, discharged on 12/26/2017 to [facility], with history of COPD requiring as needed home oxygen, essential hypertension, chronic diastolic CHF ejection fraction 60%, HIV positive (CD4=60 12/15/2017), diabetes mellitus type 2, tobacco use, paroxysmal atrial fibrillation on Eliquis, recent influenza induced ARDS requiring tracheostomy intubation presenting with SOB.  He was sent from [facility] for hypoxia (NOS) and PNA.    Clinical Impression   PTA, pt was participating in [facility] at [facility]. He presents with significant B LE weakness, B UE weakness, decreased balance, and tremoring impacting his ability [facility]. Pt also limited by decreased awareness, attention, and ability to follow commands. He requires max assist for LB ADL, min assist for UB ADL. He was able to complete multiple sit<>stand transfers with RW and +2 assistance but was unable to progress with mobility as knees buckled after approximately 2-5 seconds of standing activities. Pt would benefit from continued OT services while admitted to improve independence and safety with ADL and functional mobility. Recommend SNF level rehabilitation post-acute D/C at pt's current functional status.    Follow Up Recommendations  SNF;Supervision/Assistance - 24 hour    Equipment Recommendations  Other (comment)(defer to next venue of care)    Recommendations for Other Services       Precautions / Restrictions Precautions Precautions: Fall Precaution Comments: knees buckle unexpectedly (R>L) Restrictions Weight Bearing Restrictions: No      Mobility Bed Mobility Overal bed mobility: Needs Assistance Bed Mobility: Supine to Sit;Sit to Supine     Supine to sit: Mod  assist Sit to supine: Mod assist   General bed mobility comments: assist to [facility] and return BLE back to bed with noted tremoring and poor coordination  Transfers Overall transfer level: Needs assistance Equipment used: 2 person hand held assist;Rolling walker (2 wheeled) Transfers: Sit to/from Stand   Stand pivot transfers: Mod assist;+2 physical assistance       General transfer comment: Mod assist to power up to standing position. Once standing unable to tolerate for longer than 2 seconds before needing assistance to sit down. Pt with decreased problem solving skills. Able to complete sit<> stand x4.    Balance Overall balance assessment: Needs assistance Sitting-balance support: No upper extremity supported;Feet supported Sitting balance-Leahy Scale: Fair Sitting balance - Comments: Min guard assist at EOB.    Standing balance support: Bilateral upper extremity supported;During functional activity Standing balance-Leahy Scale: Zero Standing balance comment: Only able to stand for approximately 2 seconds prior to legs giving out.                            ADL either performed or assessed with clinical judgement   ADL Overall ADL's : Needs assistance/impaired Eating/Feeding: Sitting;Supervision/ safety   Grooming: Supervision/safety;Sitting   Upper Body Bathing: Minimal assistance;Sitting   Lower Body Bathing: Maximal assistance;+2 for physical assistance;Sit to/from stand   Upper Body Dressing : Minimal assistance;Sitting   Lower Body Dressing: Maximal assistance;Sit to/from Market researcherstand     Toilet Transfer Details (indicate cue type and reason): Able to power up to standing wtih mod assist +2 and RW. Unable to progress with mobility due to pt with knee buckling and requiring assist to be seated.  Toileting- Clothing  Manipulation and Hygiene: Total assistance;Bed level         General ADL Comments: Pt disheartened when unable to ambulate and  transfer to chair. Pt with knees buckling and requiring assistance to lower back into seated position. He does require +2 hands on assistance at all times.      Vision Patient Visual Report: No change from baseline Vision Assessment?: No apparent visual deficits     Perception     Praxis      Pertinent Vitals/Pain Pain Assessment: Faces Faces Pain Scale: Hurts little more Pain Location: abdomen Pain Descriptors / Indicators: Aching;Sore Pain Intervention(s): Limited activity within patient's tolerance;Monitored during session;Repositioned     Hand Dominance Right   Extremity/Trunk Assessment Upper Extremity Assessment Upper Extremity Assessment: Generalized weakness   Lower Extremity Assessment Lower Extremity Assessment: Defer to PT evaluation(bilateral knees buckle (R>L))       Communication Communication Communication: Expressive difficulties   Cognition Arousal/Alertness: Awake/alert Behavior During Therapy: Impulsive Overall Cognitive Status: No family/caregiver present to determine baseline cognitive functioning Area of Impairment: Attention;Following commands;Safety/judgement;Awareness                   Current Attention Level: Sustained Memory: Decreased short-term memory;Decreased recall of precautions Following Commands: Follows one step commands inconsistently Safety/Judgement: Decreased awareness of safety;Decreased awareness of deficits Awareness: Emergent Problem Solving: Requires verbal cues;Requires tactile cues;Slow processing;Decreased initiation;Difficulty sequencing General Comments: Pt impulsively attempting to stand. With decreased insight into deficits noted.    General Comments  No family present during session.     Exercises     Shoulder Instructions      Home Living Family/patient expects to be discharged to:: Skilled nursing facility                                 Additional Comments: Pt reporting that his SNF  days were coming to a close prior to return to the hospital. He does not feel ready to return home.       Prior Functioning/Environment Level of Independence: Needs assistance  Gait / Transfers Assistance Needed: at SNF walking with +2 assist (second person close follow with w/c due to knees unexpectedly give out) ADL's / Homemaking Assistance Needed: assistance from staff at rehab            OT Problem List: Decreased strength;Decreased range of motion;Decreased activity tolerance;Impaired balance (sitting and/or standing);Decreased safety awareness;Decreased knowledge of use of DME or AE;Decreased knowledge of precautions;Cardiopulmonary status limiting activity      OT Treatment/Interventions: Self-care/ADL training;Therapeutic exercise;Energy conservation;DME and/or AE instruction;Therapeutic activities;Cognitive remediation/compensation;Visual/perceptual remediation/compensation;Patient/family education;Balance training    OT Goals(Current goals can be found in the care plan section) Acute Rehab OT Goals Patient Stated Goal: [facility] where he can get therapy 5days/week OT Goal Formulation: With patient Time For Goal Achievement: 01/31/18 Potential to Achieve Goals: Good ADL Goals Pt Will Perform Grooming: with set-up;sitting Pt Will Transfer to Toilet: with min assist;stand pivot transfer;bedside commode Pt Will Perform Toileting - Clothing Manipulation and hygiene: with min assist;sit to/from stand Additional ADL Goal #1: Pt will demonstrate selective attention during grooming tasks. Additional ADL Goal #2: Pt will follow 4/5 one-step commands during ADL participation.  OT Frequency: Min 2X/week   Barriers to D/C:            Co-evaluation              AM-PAC PT "6 Clicks" Daily  Activity     Outcome Measure Help from another person eating meals?: None Help from another person taking care of personal grooming?: None Help from another person toileting,  which includes using toliet, bedpan, or urinal?: A Lot Help from another person bathing (including washing, rinsing, drying)?: A Lot Help from another person to put on and taking off regular upper body clothing?: A Little Help from another person to put on and taking off regular lower body clothing?: A Lot 6 Click Score: 17   End of Session Equipment Utilized During Treatment: Oxygen;Gait belt;Rolling walker(3L O2 via ) Nurse Communication: Mobility status;Other (comment)(pt with poor problem solving and buckling)  Activity Tolerance: Patient tolerated treatment well Patient left: in bed;with call bell/phone within reach;with bed alarm set(bed in chair position)  OT Visit Diagnosis: Unsteadiness on feet (R26.81);Other abnormalities of gait and mobility (R26.89);Muscle weakness (generalized) (M62.81);Other symptoms and signs involving cognitive function                Time: 1433-1500 OT Time Calculation (min): 27 min Charges:  OT General Charges $OT Visit: 1 Visit OT Evaluation $OT Eval Moderate Complexity: 1 Mod OT Treatments $Self Care/Home Management : 8-22 mins G-Codes:     Doristine Section, MS OTR/L  Pager: [phone]   Melenda Bielak A Sherlie Boyum 01/17/2018, 4:27 PM

## 2018-01-17 NOTE — Progress Notes (Signed)
PROGRESS NOTE    Chris [name].  ZOX:[id number] DOB: [birth year] DOA: 01/14/2018 PCP: Claiborne Rigg, NP    Brief Narrative: Chris Favors Alejandro [name]. is a 52 y.o. male with medical history significant of acute on chronic respiratory failure, discharged on 12/26/2017 to [facility], with history of COPD requiring as needed home oxygen, essential hypertension, chronic diastolic CHF ejection fraction 60%, HIV positive (CD4=60 12/15/2017), diabetes mellitus type 2, tobacco use, paroxysmal atrial fibrillation on Eliquis, recent influenza induced ARDS requiring tracheostomy intubation presenting with SOB.  He was sent from [facility] for hypoxia (NOS) and PNA. Pt requesting to be transferred to [facility].     Assessment & Plan:   Principal Problem:   Acute on chronic respiratory failure with hypoxia (HCC) Active Problems:   Cyst (solitary) of breast   Essential hypertension, benign   Diabetes mellitus type 2 in obese (HCC)   Atrial fibrillation (HCC)   HIV (human immunodeficiency virus infection) (HCC)   HCAP (healthcare-associated pneumonia)  Acute on chronic respiratory failure with hypoxia secondary to persistent health care associated pneumonia.  Admitted for IV antibiotics, urine for strep antigen ordered. Pt on 3 lit of Rote oxygen.  Blood cultures ordered and negative so far.  Continue with IV antibiotics for another 24 hours, before transitioning to oral antibiotics.  Request PT and OT consultations.     Hypertension:  Well controlled. No change in medications.   Atrial fibrillation:  Rate controlled with cardizem and amiodarone and anti coagulation with eliquis .    COPD:  Resume spiriva and duonebs as needed.  Resume dulera. No wheezing heard today.    Chronic diastolic heart failure : he appears to be compensated.  Resume lasix . NO CHANGE IN medications.  Strict intake and output  Daily weights.     H/O HIV: Resume home meds.   Anemia of chronic disease:    Hemoglobin is 7.4 , possibly from hemodilution.  1 unit of prbc transfusion ordered.  Repeat hemoglobin is around 9.  Anemia panel in march showed normal iron, vitamin b12 and folate levels.    Diabetes mellitus:  CBG (last 3)  Recent Labs    01/16/18 0816 01/16/18 1203 01/16/18 1633  GLUCAP 155* 94 197*   Controlled . Resume levemir and SSI.  No change in meds.     DVT prophylaxis: eliquis.  Code Status:  Full code.  Family Communication: none at bedside.  Disposition Plan: pending PT evaluation. Possibly back to SNF after insurance authorization.    Consultants:   None.    Procedures: none.    Antimicrobials:vancomycin and cefepime since admission.    Subjective: Pt reports he feels the same as yesterday.   Objective: Vitals:   01/16/18 2340 01/17/18 0649 01/17/18 0650 01/17/18 0724  BP: 116/70 (!) 141/94 (!) 141/94 127/78  Pulse: 91  95 95  Resp: 18   (!) 24  Temp: 98.5 F (36.9 C)   98.2 F (36.8 C)  TempSrc: Oral   Oral  SpO2: 91%   99%  Weight:      Height:        Intake/Output Summary (Last 24 hours) at 01/17/2018 0848 Last data filed at 01/16/2018 2317 Gross per 24 hour  Intake 720 ml  Output 725 ml  Net -5 ml   Filed Weights   01/14/18 1523 01/14/18 2146  Weight: 109.8 kg (242 lb) 105.8 kg (233 lb 4 oz)    Examination:  General exam: Appears calm and comfortable  not in distress. Respiratory system: Clear to auscultation. Respiratory effort normal. No wheezing or rhonchi.  Cardiovascular system: S1 & S2 heard, tachycardic.  No pedal edema. Gastrointestinal system: Abdomen is soft , obses, not tender non distended bowel sounds good.  Central nervous system: Alert and oriented. NON focal.  Extremities: Symmetric 5 x 5 power. Skin: No rashes, lesions or ulcers Psychiatry: Mood & affect appropriate.     Data Reviewed: I have personally reviewed following labs and imaging studies  CBC: Recent Labs  Lab 01/14/18 1607 01/15/18 0241  01/16/18 0916  WBC 10.9* 8.4  --   NEUTROABS 5.3 4.3  --   HGB 8.1* 7.4* 9.0*  HCT 28.2* 25.5* 29.9*  MCV 96.9 97.0  --   PLT 284 261  --    Basic Metabolic Panel: Recent Labs  Lab 01/14/18 1607 01/15/18 0241  NA 137 140  K 4.1 3.9  CL 95* 99*  CO2 33* 35*  GLUCOSE 80 93  BUN 15 13  CREATININE 1.32* 1.23  CALCIUM 9.2 8.9   GFR: Estimated Creatinine Clearance: 85.1 mL/min (by C-G formula based on SCr of 1.23 mg/dL). Liver Function Tests: Recent Labs  Lab 01/14/18 1607  AST 23  ALT 26  ALKPHOS 51  BILITOT 0.5  PROT 7.3  ALBUMIN 2.7*   No results for input(s): LIPASE, AMYLASE in the last 168 hours. No results for input(s): AMMONIA in the last 168 hours. Coagulation Profile: No results for input(s): INR, PROTIME in the last 168 hours. Cardiac Enzymes: No results for input(s): CKTOTAL, CKMB, CKMBINDEX, TROPONINI in the last 168 hours. BNP (last 3 results) No results for input(s): PROBNP in the last 8760 hours. HbA1C: No results for input(s): HGBA1C in the last 72 hours. CBG: Recent Labs  Lab 01/15/18 1724 01/15/18 2128 01/16/18 0816 01/16/18 1203 01/16/18 1633  GLUCAP 90 149* 155* 94 197*   Lipid Profile: No results for input(s): CHOL, HDL, LDLCALC, TRIG, CHOLHDL, LDLDIRECT in the last 72 hours. Thyroid Function Tests: No results for input(s): TSH, T4TOTAL, FREET4, T3FREE, THYROIDAB in the last 72 hours. Anemia Panel: No results for input(s): VITAMINB12, FOLATE, FERRITIN, TIBC, IRON, RETICCTPCT in the last 72 hours. Sepsis Labs: Recent Labs  Lab 01/14/18 1627 01/14/18 1815 01/14/18 2110  PROCALCITON  --   --  0.52  LATICACIDVEN 1.38 1.10  --     Recent Results (from the past 240 hour(s))  Culture, blood (Routine X 2) w Reflex to ID Panel     Status: None (Preliminary result)   Collection Time: 01/14/18  6:10 PM  Result Value Ref Range Status   Specimen Description BLOOD LEFT HAND  Final   Special Requests   Final    BOTTLES DRAWN AEROBIC ONLY  Blood Culture results may not be optimal due to an inadequate volume of blood received in culture bottles   Culture   Final    NO GROWTH 2 DAYS Performed at [facility], [address] [place], [place] [address]    Report Status PENDING  Incomplete  Culture, blood (Routine X 2) w Reflex to ID Panel     Status: None (Preliminary result)   Collection Time: 01/14/18  6:14 PM  Result Value Ref Range Status   Specimen Description BLOOD RIGHT HAND  Final   Special Requests   Final    BOTTLES DRAWN AEROBIC ONLY Blood Culture results may not be optimal due to an inadequate volume of blood received in culture bottles   Culture  Final    NO GROWTH 2 DAYS Performed at [facility], [address], [place], [place] [address]    Report Status PENDING  Incomplete         Radiology Studies: No results found.      Scheduled Meds: . amiodarone  200 mg Oral Daily  . amitriptyline  100 mg Oral QHS  . apixaban  5 mg Oral BID  . bictegravir-emtricitabine-tenofovir AF  1 tablet Oral Q1500  . diltiazem  240 mg Oral Daily  . furosemide  40 mg Oral BID  . gabapentin  300 mg Oral TID  . guaiFENesin  1,200 mg Oral BID  . hydrALAZINE  50 mg Oral Q8H  . insulin aspart  0-15 Units Subcutaneous TID WC  . insulin detemir  30 Units Subcutaneous Q2200  . isosorbide mononitrate  30 mg Oral Daily  . lidocaine  20 mL Intradermal Once  . mouth rinse  15 mL Mouth Rinse BID  . mometasone-formoterol  2 puff Inhalation BID  . potassium chloride SA  20 mEq Oral Daily  . tiotropium  1 capsule Inhalation Daily   Continuous Infusions: . ceFEPime (MAXIPIME) IV Stopped (01/17/18 0736)  . vancomycin Stopped (01/17/18 0749)     LOS: 3 days    Time spent: 35 minutes    Kathlen Mody, MD [facility] Pager [phone]  If 7PM-7AM, please contact night-coverage [email or web address] Password [email or web address] 01/17/2018, 8:48 AM

## 2018-01-18 ENCOUNTER — Inpatient Hospital Stay (HOSPITAL_COMMUNITY): Payer: Medicare HMO

## 2018-01-18 ENCOUNTER — Other Ambulatory Visit: Payer: Self-pay | Admitting: *Deleted

## 2018-01-18 LAB — BASIC METABOLIC PANEL
ANION GAP: 10 (ref 5–15)
BUN: 14 mg/dL (ref 6–20)
CALCIUM: 9.2 mg/dL (ref 8.9–10.3)
CHLORIDE: 93 mmol/L — AB (ref 101–111)
CO2: 31 mmol/L (ref 22–32)
Creatinine, Ser: 1.19 mg/dL (ref 0.61–1.24)
GFR calc non Af Amer: >60 mL/min (ref >60)
Glucose, Bld: 161 mg/dL — ABNORMAL HIGH (ref 65–99)
Potassium: 3.8 mmol/L (ref 3.5–5.1)
SODIUM: 134 mmol/L — AB (ref 135–145)

## 2018-01-18 LAB — CBC
HEMATOCRIT: 28.9 % — AB (ref 39.0–52.0)
HEMOGLOBIN: 8.5 g/dL — AB (ref 13.0–17.0)
MCH: 27.6 pg (ref 26.0–34.0)
MCHC: 29.4 g/dL — ABNORMAL LOW (ref 30.0–36.0)
MCV: 93.8 fL (ref 78.0–100.0)
Platelets: 276 10*3/uL (ref 150–400)
RBC: 3.08 MIL/uL — ABNORMAL LOW (ref 4.22–5.81)
RDW: 13.8 % (ref 11.5–15.5)
WBC: 18.1 10*3/uL — ABNORMAL HIGH (ref 4.0–10.5)

## 2018-01-18 LAB — GLUCOSE, CAPILLARY
GLUCOSE-CAPILLARY: 93 mg/dL (ref 65–99)
GLUCOSE-CAPILLARY: 99 mg/dL (ref 65–99)
GLUCOSE-CAPILLARY: 80 mg/dL (ref 65–99)
Glucose-Capillary: 102 mg/dL — ABNORMAL HIGH (ref 65–99)
Glucose-Capillary: 174 mg/dL — ABNORMAL HIGH (ref 65–99)
Glucose-Capillary: 97 mg/dL (ref 65–99)

## 2018-01-18 MED ORDER — DAPSONE 100 MG PO TABS
100.0000 mg | ORAL_TABLET | Freq: Every day | ORAL | Status: DC
  Filled 2018-01-18: qty 1

## 2018-01-18 MED ORDER — DAPSONE 100 MG PO TABS
100.0000 mg | ORAL_TABLET | Freq: Every day | ORAL | Status: DC
  Administered 2018-01-18 – 2018-01-19 (×2): 100 mg via ORAL
  Filled 2018-01-18 (×2): qty 1

## 2018-01-18 MED ORDER — FUROSEMIDE 10 MG/ML IJ SOLN
40.0000 mg | Freq: Once | INTRAMUSCULAR | Status: AC
  Administered 2018-01-18: 40 mg via INTRAVENOUS
  Filled 2018-01-18: qty 4

## 2018-01-18 NOTE — Care Management Important Message (Signed)
Important Message  Patient Details  Name: Chris RiversJerome A Mccowan Jr. MRN: [id number] Date of Birth: 12/10/1965   Medicare Important Message Given:  Yes    Chris [name] 01/18/2018, 3:43 PM

## 2018-01-18 NOTE — Progress Notes (Signed)
CSW spoke with [facility] liaison and sent updated notes- they are restarting patient [employer] Medicare auth this morning for anticipated return to SNF when stable  CSW will continue to follow and assist as needed  Burna SisJenna H. Ashawna Hanback, LCSW Clinical Social Worker [phone]

## 2018-01-18 NOTE — Care Management Important Message (Signed)
Important Message  Patient Details  Name: Chris RiversJerome A Baskett Jr. MRN: [id number] Date of Birth: 08/26/1965   Medicare Important Message Given:  Yes    Chioke Noxon 01/18/2018, 3:42 PM

## 2018-01-18 NOTE — Patient Outreach (Signed)
[facility] Care Management  01/18/2018  Ruel FavorsJerome A Coste Jr. 02/05/1966 [id number]   CSW was scheduled to meet with patient at [facility] today, Skilled Nursing Facility where patient was residing to receive short-term rehabilitative services; however, CSW noted that patient was [facility], [facility] for Pneumonia.  Patient admits to experiencing symptoms for 2 weeks.  The plan is for patient to return to [facility] upon discharge from the hospital.  Patient's insurance provider [facility]([employer]) has already approved authorization, according to patient's inpatient hospital social worker, Rosetta PosnerJenna Uris.  CSW will continue to follow patient while hospitalized, then resume social worker services once patient has been released back to [facility]. Danford BadJoanna Deliana Avalos, BSW, MSW, LCSW  Licensed Restaurant manager, fast foodClinical Social Worker  [facility]  M[address] [place], [place], [place] [address] Physical Address-[address], [place], [place] [address] Toll Free Main # [phone] Fax # [phone] Cell # [phone]  Office # [phone] [email or web address]

## 2018-01-18 NOTE — Progress Notes (Signed)
PROGRESS NOTE    Chris RiversJerome A Penniman Jr.  ZOX:[id number] DOB: 12/05/1965 DOA: 01/14/2018 PCP: Chris [name]    Brief Narrative: Chris FavorsJerome A Kapaun [name] is a 52 y.o. male with medical history significant of acute on chronic respiratory failure, discharged on 12/26/2017 to [facility], with history of COPD requiring as needed home oxygen, essential hypertension, chronic diastolic CHF ejection fraction 60%, HIV positive (CD4=60 12/15/2017), diabetes mellitus type 2, tobacco use, paroxysmal atrial fibrillation on Eliquis, recent influenza induced ARDS requiring tracheostomy intubation presenting with SOB.  He was sent [facility] for hypoxia (NOS) and PNA. Pt requesting to be transferred [facility].     Assessment & Plan:   Principal Problem:   Acute on chronic respiratory failure with hypoxia (HCC) Active Problems:   Cyst (solitary) of breast   Essential hypertension, benign   Diabetes mellitus type 2 in obese (HCC)   Atrial fibrillation (HCC)   HIV (human immunodeficiency virus infection) (HCC)   HCAP (healthcare-associated pneumonia)  Acute on chronic respiratory failure with hypoxia secondary to persistent health care associated pneumonia.  Admitted for IV antibiotics, urine for strep antigen ordered. Pt on 3 lit of [place] oxygen.  Blood cultures ordered and negative so far.  Repeat CXR does not show any consolidation, plan to transition to oral antibiotics on discharge tomorrow.  Request PT and OT consultations recommending discharge to SNF.     Hypertension:  Well controlled. No change in medications.   Atrial fibrillation:  Rate controlled with cardizem and amiodarone and anti coagulation with eliquis .    COPD:  Resume spiriva and duonebs as needed.  Resume dulera. No wheezing heard today.    Chronic diastolic heart failure : he appears to be compensated.  Resume lasix . On exam pt had bibasilar rales, one dose of IV lasix ordered today.  Repeat CXR does not show any  pulm edema.  Strict intake and output  Daily weights.      H/O HIV: Resume home meds. CD4 COUNT less than 200, dapsone added.   Leukocytosis:  Unclear etiology. Pt remains afebrile, repeat cbc in am.    Anemia of chronic disease:  Hemoglobin is 7.4 , possibly from hemodilution.  1 unit of prbc transfusion ordered.  Repeat hemoglobin is around 8.5.  Anemia panel in march showed normal iron, vitamin b12 and folate levels.    Diabetes mellitus:  CBG (last 3)  Recent Labs    01/17/18 2116 01/18/18 0743 01/18/18 1206  GLUCAP 99 80 102*   Controlled . Resume levemir and SSI.  No change in meds.     DVT prophylaxis: eliquis.  Code Status:  Full code.  Family Communication: none at bedside.  Disposition Plan: pending PT evaluation. Possibly back to SNF after insurance authorization , tomorrow.    Consultants:   None.    Procedures: none.    Antimicrobials:cefepime since admission.    Subjective: No chest pain , some sob earlier this am.  Cough has improved.   Objective: Vitals:   01/17/18 2104 01/17/18 2317 01/18/18 0602 01/18/18 0745  BP: 133/77 139/82 (!) 146/70 116/76  Pulse:  92  98  Resp:  20  19  Temp:  97.7 F (36.5 C)    TempSrc:  Oral    SpO2:  99%  99%  Weight:      Height:        Intake/Output Summary (Last 24 hours) at 01/18/2018 1310 Last data filed at 01/18/2018 1045 Gross per 24  hour  Intake 690 ml  Output 825 ml  Net -135 ml   Filed Weights   01/14/18 1523 01/14/18 2146  Weight: 109.8 kg (242 lb) 105.8 kg (233 lb 4 oz)    Examination:  General exam: Appears calm and comfortable not in distress.on 3l it of [place] oxygen.  Respiratory system: basilar rales. No wheezing or rhonchi.  Cardiovascular system: S1 & S2 heard, tachycardic.  No pedal edema. Gastrointestinal system: Abdomen is soft , non tender non distended bowel sounds okay.  Central nervous system: Alert and oriented. NON focal.  Extremities: Symmetric 5 x 5  power. Skin: No rashes, lesions or ulcers Psychiatry: Mood & affect appropriate.     Data Reviewed: I have personally reviewed following labs and imaging studies  CBC: Recent Labs  Lab 01/14/18 1607 01/15/18 0241 01/16/18 0916 01/18/18 0223  WBC 10.9* 8.4  --  18.1*  NEUTROABS 5.3 4.3  --   --   HGB 8.1* 7.4* 9.0* 8.5*  HCT 28.2* 25.5* 29.9* 28.9*  MCV 96.9 97.0  --  93.8  PLT 284 261  --  276   Basic Metabolic Panel: Recent Labs  Lab 01/14/18 1607 01/15/18 0241 01/18/18 0223  NA 137 140 134*  K 4.1 3.9 3.8  CL 95* 99* 93*  CO2 33* 35* 31  GLUCOSE 80 93 161*  BUN 15 13 14   CREATININE 1.32* 1.23 1.19  CALCIUM 9.2 8.9 9.2   GFR: Estimated Creatinine Clearance: 88 mL/min (by C-G formula based on SCr of 1.19 mg/dL). Liver Function Tests: Recent Labs  Lab 01/14/18 1607  AST 23  ALT 26  ALKPHOS 51  BILITOT 0.5  PROT 7.3  ALBUMIN 2.7*   No results for input(s): LIPASE, AMYLASE in the last 168 hours. No results for input(s): AMMONIA in the last 168 hours. Coagulation Profile: No results for input(s): INR, PROTIME in the last 168 hours. Cardiac Enzymes: No results for input(s): CKTOTAL, CKMB, CKMBINDEX, TROPONINI in the last 168 hours. BNP (last 3 results) No results for input(s): PROBNP in the last 8760 hours. HbA1C: No results for input(s): HGBA1C in the last 72 hours. CBG: Recent Labs  Lab 01/17/18 1204 01/17/18 1700 01/17/18 2116 01/18/18 0743 01/18/18 1206  GLUCAP 99 147* 99 80 102*   Lipid Profile: No results for input(s): CHOL, HDL, LDLCALC, TRIG, CHOLHDL, LDLDIRECT in the last 72 hours. Thyroid Function Tests: No results for input(s): TSH, T4TOTAL, FREET4, T3FREE, THYROIDAB in the last 72 hours. Anemia Panel: No results for input(s): VITAMINB12, FOLATE, FERRITIN, TIBC, IRON, RETICCTPCT in the last 72 hours. Sepsis Labs: Recent Labs  Lab 01/14/18 1627 01/14/18 1815 01/14/18 2110  PROCALCITON  --   --  0.52  LATICACIDVEN 1.38 1.10  --      Recent Results (from the past 240 hour(s))  Culture, blood (Routine X 2) w Reflex to ID Panel     Status: None (Preliminary result)   Collection Time: 01/14/18  6:10 PM  Result Value Ref Range Status   Specimen Description BLOOD LEFT HAND  Final   Special Requests   Final    BOTTLES DRAWN AEROBIC ONLY Blood Culture results may not be optimal due to an inadequate volume of blood received in culture bottles   Culture   Final    NO GROWTH 4 DAYS Performed at [facility], [address], [place], [place] [address]    Report Status PENDING  Incomplete  Culture, blood (Routine X 2) w Reflex to ID Panel  Status: None (Preliminary result)   Collection Time: 01/14/18  6:14 PM  Result Value Ref Range Status   Specimen Description BLOOD RIGHT HAND  Final   Special Requests   Final    BOTTLES DRAWN AEROBIC ONLY Blood Culture results may not be optimal due to an inadequate volume of blood received in culture bottles   Culture   Final    NO GROWTH 4 DAYS Performed at [facility], [address] [place], [place] [address]    Report Status PENDING  Incomplete         Radiology Studies: Dg Chest 2 View  Result Date: 01/18/2018 CLINICAL DATA:  Shortness of breath. EXAM: CHEST - 2 VIEW COMPARISON:  01/14/2018, 10/17/2017 FINDINGS: There is no focal consolidation. There is mild bilateral interstitial prominence. There is no pleural effusion or pneumothorax. The heart and mediastinal contours are unremarkable. The osseous structures are unremarkable. IMPRESSION: 1. No focal consolidation to suggest pneumonia. 2. Bilateral mild interstitial thickening similar to multiple prior exams. Electronically Signed   By: Elige Ko   On: 01/18/2018 12:57        Scheduled Meds: . amiodarone  200 mg Oral Daily  . amitriptyline  100 mg Oral QHS  . apixaban  5 mg Oral BID  . bictegravir-emtricitabine-tenofovir AF  1 tablet Oral Q1500  . dapsone  100 mg Oral Q breakfast  . diltiazem   240 mg Oral Daily  . furosemide  40 mg Intravenous Once  . furosemide  40 mg Oral BID  . gabapentin  300 mg Oral TID  . guaiFENesin  1,200 mg Oral BID  . hydrALAZINE  50 mg Oral Q8H  . insulin aspart  0-15 Units Subcutaneous TID WC  . insulin detemir  30 Units Subcutaneous Q2200  . isosorbide mononitrate  30 mg Oral Daily  . lidocaine  20 mL Intradermal Once  . mouth rinse  15 mL Mouth Rinse BID  . mometasone-formoterol  2 puff Inhalation BID  . potassium chloride SA  20 mEq Oral Daily  . tiotropium  1 capsule Inhalation Daily   Continuous Infusions: . ceFEPime (MAXIPIME) IV Stopped (01/18/18 1191)     LOS: 4 days    Time spent: 35 minutes    Kathlen Mody, MD [facility] Pager [phone]  If 7PM-7AM, please contact night-coverage [email or web address] Password [email or web address] 01/18/2018, 1:10 PM

## 2018-01-19 DIAGNOSIS — E669 Obesity, unspecified: Secondary | ICD-10-CM | POA: Diagnosis not present

## 2018-01-19 DIAGNOSIS — R140 Abdominal distension (gaseous): Secondary | ICD-10-CM | POA: Diagnosis not present

## 2018-01-19 DIAGNOSIS — Z881 Allergy status to other antibiotic agents status: Secondary | ICD-10-CM | POA: Diagnosis not present

## 2018-01-19 DIAGNOSIS — R410 Disorientation, unspecified: Secondary | ICD-10-CM | POA: Diagnosis not present

## 2018-01-19 DIAGNOSIS — R5381 Other malaise: Secondary | ICD-10-CM | POA: Diagnosis not present

## 2018-01-19 DIAGNOSIS — Y95 Nosocomial condition: Secondary | ICD-10-CM | POA: Diagnosis not present

## 2018-01-19 DIAGNOSIS — I480 Paroxysmal atrial fibrillation: Secondary | ICD-10-CM

## 2018-01-19 DIAGNOSIS — R0602 Shortness of breath: Secondary | ICD-10-CM | POA: Diagnosis not present

## 2018-01-19 DIAGNOSIS — J440 Chronic obstructive pulmonary disease with acute lower respiratory infection: Secondary | ICD-10-CM | POA: Diagnosis not present

## 2018-01-19 DIAGNOSIS — Z9981 Dependence on supplemental oxygen: Secondary | ICD-10-CM | POA: Diagnosis not present

## 2018-01-19 DIAGNOSIS — F329 Major depressive disorder, single episode, unspecified: Secondary | ICD-10-CM | POA: Diagnosis present

## 2018-01-19 DIAGNOSIS — Z8701 Personal history of pneumonia (recurrent): Secondary | ICD-10-CM | POA: Diagnosis not present

## 2018-01-19 DIAGNOSIS — M2550 Pain in unspecified joint: Secondary | ICD-10-CM | POA: Diagnosis not present

## 2018-01-19 DIAGNOSIS — J9621 Acute and chronic respiratory failure with hypoxia: Secondary | ICD-10-CM | POA: Diagnosis not present

## 2018-01-19 DIAGNOSIS — R69 Illness, unspecified: Secondary | ICD-10-CM | POA: Diagnosis not present

## 2018-01-19 DIAGNOSIS — R498 Other voice and resonance disorders: Secondary | ICD-10-CM | POA: Diagnosis not present

## 2018-01-19 DIAGNOSIS — R509 Fever, unspecified: Secondary | ICD-10-CM | POA: Diagnosis not present

## 2018-01-19 DIAGNOSIS — I110 Hypertensive heart disease with heart failure: Secondary | ICD-10-CM | POA: Diagnosis not present

## 2018-01-19 DIAGNOSIS — Z794 Long term (current) use of insulin: Secondary | ICD-10-CM | POA: Diagnosis not present

## 2018-01-19 DIAGNOSIS — B20 Human immunodeficiency virus [HIV] disease: Secondary | ICD-10-CM

## 2018-01-19 DIAGNOSIS — J449 Chronic obstructive pulmonary disease, unspecified: Secondary | ICD-10-CM | POA: Diagnosis not present

## 2018-01-19 DIAGNOSIS — I5030 Unspecified diastolic (congestive) heart failure: Secondary | ICD-10-CM | POA: Diagnosis not present

## 2018-01-19 DIAGNOSIS — R000 Tachycardia, unspecified: Secondary | ICD-10-CM | POA: Diagnosis not present

## 2018-01-19 DIAGNOSIS — I10 Essential (primary) hypertension: Secondary | ICD-10-CM

## 2018-01-19 DIAGNOSIS — Z7901 Long term (current) use of anticoagulants: Secondary | ICD-10-CM | POA: Diagnosis not present

## 2018-01-19 DIAGNOSIS — M6281 Muscle weakness (generalized): Secondary | ICD-10-CM | POA: Diagnosis not present

## 2018-01-19 DIAGNOSIS — J181 Lobar pneumonia, unspecified organism: Secondary | ICD-10-CM | POA: Diagnosis not present

## 2018-01-19 DIAGNOSIS — J189 Pneumonia, unspecified organism: Secondary | ICD-10-CM | POA: Diagnosis not present

## 2018-01-19 DIAGNOSIS — R0902 Hypoxemia: Secondary | ICD-10-CM | POA: Diagnosis not present

## 2018-01-19 DIAGNOSIS — R062 Wheezing: Secondary | ICD-10-CM | POA: Diagnosis not present

## 2018-01-19 DIAGNOSIS — Z7401 Bed confinement status: Secondary | ICD-10-CM | POA: Diagnosis not present

## 2018-01-19 DIAGNOSIS — K219 Gastro-esophageal reflux disease without esophagitis: Secondary | ICD-10-CM | POA: Diagnosis present

## 2018-01-19 DIAGNOSIS — E119 Type 2 diabetes mellitus without complications: Secondary | ICD-10-CM | POA: Diagnosis not present

## 2018-01-19 DIAGNOSIS — I5032 Chronic diastolic (congestive) heart failure: Secondary | ICD-10-CM | POA: Diagnosis not present

## 2018-01-19 DIAGNOSIS — Z87891 Personal history of nicotine dependence: Secondary | ICD-10-CM | POA: Diagnosis not present

## 2018-01-19 DIAGNOSIS — J9601 Acute respiratory failure with hypoxia: Secondary | ICD-10-CM | POA: Diagnosis not present

## 2018-01-19 DIAGNOSIS — A419 Sepsis, unspecified organism: Secondary | ICD-10-CM | POA: Diagnosis not present

## 2018-01-19 DIAGNOSIS — S21002D Unspecified open wound of left breast, subsequent encounter: Secondary | ICD-10-CM | POA: Diagnosis not present

## 2018-01-19 DIAGNOSIS — R0689 Other abnormalities of breathing: Secondary | ICD-10-CM | POA: Diagnosis not present

## 2018-01-19 DIAGNOSIS — J219 Acute bronchiolitis, unspecified: Secondary | ICD-10-CM | POA: Diagnosis not present

## 2018-01-19 LAB — CULTURE, BLOOD (ROUTINE X 2)
CULTURE: NO GROWTH
Culture: NO GROWTH

## 2018-01-19 LAB — GLUCOSE, CAPILLARY
GLUCOSE-CAPILLARY: 101 mg/dL — AB (ref 65–99)
Glucose-Capillary: 69 mg/dL (ref 65–99)
Glucose-Capillary: 215 mg/dL — ABNORMAL HIGH (ref 65–99)

## 2018-01-19 MED ORDER — OXYCODONE HCL 5 MG PO TABS: 5.0000 mg | ORAL_TABLET | Freq: Three times a day (TID) | ORAL | 0 refills | Status: DC | PRN

## 2018-01-19 MED ORDER — DAPSONE 100 MG PO TABS: 100.0000 mg | ORAL_TABLET | Freq: Every day | ORAL | 1 refills | Status: DC

## 2018-01-19 NOTE — Discharge Summary (Signed)
Discharge Summary  Chris [name]. ZLD:[id number] DOB: 09-05-1965  PCP: [name], NP  Admit date: 01/14/2018 Discharge date: 01/19/2018  Time spent: 25 minutes  Recommendations for Outpatient Follow-up:  1. New medication: Dapsone 100 mg p.o. daily 2. Patient being discharged back to [facility]  Discharge Diagnoses:  Active Hospital Problems   Diagnosis Date Noted  . Acute on chronic respiratory failure with hypoxia ([place]) 07/21/2017  . HCAP (healthcare-associated pneumonia) 01/14/2018  . Atrial fibrillation ([place]) 10/01/2017  . Diabetes mellitus type 2 in obese ([place]) 10/01/2017  . HIV (human immunodeficiency virus infection) ([place]) 10/01/2017  . Essential hypertension, benign 12/04/2014  . Cyst (solitary) of breast 08/23/2013    Resolved Hospital Problems  No resolved problems to display.    Discharge Condition: Improved, being discharged to skilled nursing   Diet recommendation: Carb modified,    Vitals:   01/19/18 0818 01/19/18 0820  BP:    Pulse:  96  Resp:  18  Temp:    SpO2: 98% 98%    History of present illness:  Chris Ortizis a 52 y.o.malewith medical history significant ofacute on chronic respiratory failure, discharged on 12/26/2017 to [facility], with history of COPD requiring as needed home oxygen, essential hypertension, chronic diastolic CHF ejection fraction 60%, HIV positive (CD4=60 12/15/2017), diabetes mellitus type 2, tobacco use, paroxysmal atrial fibrillation on Eliquis, recent influenza induced ARDS requiring tracheostomy intubationpresenting with SOB. He was sent from [facility] for hypoxia (NOS) and PNA.  Hospital Course:  Principal Problem:   Acute on chronic respiratory failure with hypoxia (HCC) secondary to healthcare associated pneumonia: Patient started on IV antibiotics.  Initially requiring 3 L nasal cannula, normally on 2 L.  Blood cultures with no growth to date.  Seen by PT and OT who  recommended discharging back to skilled nursing.  By 6/11 patient's oxygenation back to 2 L nasal cannula.  He completed 6 days of IV antibiotics at this point.  Repeat chest x-ray noted no consolidation.  Patient felt to medically stable Active Problems:   Cyst (solitary) of breast   Essential hypertension, benign: Blood pressure stable, continue home medications   Diabetes mellitus type 2 in obese ([place]), on long-term use of insulin: Stable.  Continued on his Lantus plus sliding scale.  No   Atrial fibrillation ([place]): Paroxysmal, with chads 2 score of 5.  Rate controlled on Cardizem and amiodarone.  On Eliquis for anticoagulation.   HIV (human immunodeficiency virus infection) (HCC)/AIDS:Patient continued on home medications.  CD4 count noted to be officially less than 200.  Dapsone added   HCAP (healthcare-associated pneumonia)   Procedures:  None  Consultations:  None  Discharge Exam: BP 133/77 (BP Location: Left Arm)   Pulse 96   Temp 99.1 F (37.3 C) (Oral)   Resp 18   Ht _0  (1.753 m)   Wt 102.9 kg (226 lb 13.7 oz)   SpO2 98%   BMI 33.50 kg/m    General: Alert and oriented x3, no acute distress Cardiovascular: Irregular rhythm, rate controlled Respiratory: Decreased breath sounds throughout  Discharge Instructions You were cared for by a hospitalist during your hospital stay. If you have any questions about your discharge medications or the care you received while you were in the hospital after you are discharged, you can call the unit and asked to speak with the hospitalist on call if the hospitalist that took care of you is not available. Once you are discharged, your  primary care physician will handle any further medical issues. Please note that NO REFILLS for any discharge medications will be authorized once you are discharged, as it is imperative that you return to your primary care physician (or establish a relationship with a primary care physician if you do not have  one) for your aftercare needs so that they can reassess your need for medications and monitor your lab values.   Allergies as of 01/19/2018      Reactions   Bactrim [sulfamethoxazole-trimethoprim] Hives      Medication List    TAKE these medications   acetaminophen 500 MG tablet Commonly known as:  TYLENOL Take 1,000 mg by mouth every 6 (six) hours as needed for headache (pain).   albuterol 108 (90 Base) MCG/ACT inhaler Commonly known as:  PROVENTIL HFA;VENTOLIN HFA Inhale 2 puffs into the lungs every 6 (six) hours as needed for wheezing or shortness of breath.   amiodarone 200 MG tablet Commonly known as:  PACERONE Take 200 mg by mouth daily.   amitriptyline 100 MG tablet Commonly known as:  ELAVIL Take 1 tablet (100 mg total) by mouth at bedtime.   apixaban 5 MG Tabs tablet Commonly known as:  ELIQUIS Take 1 tablet (5 mg total) by mouth 2 (two) times daily for 15 days.   BIKTARVY 50-200-25 MG Tabs tablet Generic drug:  bictegravir-emtricitabine-tenofovir AF Take 1 tablet by mouth daily at 3 pm.   dapsone 100 MG tablet Take 1 tablet (100 mg total) by mouth daily with breakfast. Start taking on:  01/20/2018   diltiazem 240 MG 24 hr capsule Commonly known as:  CARDIZEM CD Take 1 capsule (240 mg total) by mouth daily.   docusate 50 MG/5ML liquid Commonly known as:  COLACE Place 10 mLs (100 mg total) into feeding tube 2 (two) times daily as needed for mild constipation or moderate constipation.   furosemide 40 MG tablet Commonly known as:  LASIX Take 40 mg by mouth 2 (two) times daily.   gabapentin 300 MG capsule Commonly known as:  NEURONTIN Take 1 capsule (300 mg total) by mouth 3 (three) times daily.   glucose blood test strip Commonly known as:  ONETOUCH VERIO Use as instructed   guaiFENesin 600 MG 12 hr tablet Commonly known as:  MUCINEX Take 1,200 mg by mouth 2 (two) times daily.   hydrALAZINE 50 MG tablet Commonly known as:  APRESOLINE Take 1 tablet  (50 mg total) by mouth every 8 (eight) hours.   Insulin Detemir 100 UNIT/ML Pen Commonly known as:  LEVEMIR Inject 30 Units into the skin daily at 10 pm.   Insulin Pen Needle 31G X 5 MM Misc Commonly known as:  B-D UF III MINI PEN NEEDLES Use as instructed   isosorbide mononitrate 30 MG 24 hr tablet Commonly known as:  IMDUR Take 1 tablet (30 mg total) by mouth daily.   metFORMIN 1000 MG tablet Commonly known as:  GLUCOPHAGE Take 1 tablet (1,000 mg total) by mouth daily with breakfast.   Misc. Devices Misc Please provide patient with a portable oximeter. Diagnosis: COPD GOLD III   mometasone-formoterol 200-5 MCG/ACT Aero Commonly known as:  DULERA Inhale 2 puffs into the lungs 2 (two) times daily.   ONETOUCH DELICA LANCETS 82U Misc Use as instructed   ONETOUCH VERIO w/Device Kit 1 kit by Does not apply route 2 (two) times daily.   oxyCODONE 5 MG immediate release tablet Commonly known as:  Oxy IR/ROXICODONE Take 1 tablet (5 mg total)  by mouth 3 (three) times daily as needed for up to 15 doses for moderate pain or severe pain (pain).   OXYGEN Inhale 2 L into the lungs as needed (shortness of breath).   potassium chloride SA 20 MEQ tablet Commonly known as:  K-DUR,KLOR-CON Take 20 mEq by mouth daily.   SPIRIVA RESPIMAT 2.5 MCG/ACT Aers Generic drug:  Tiotropium Bromide Monohydrate Inhale 2 puffs into the lungs daily.      Allergies  Allergen Reactions  . Bactrim [Sulfamethoxazole-Trimethoprim] Hives   Contact information for after-discharge care    Destination    [facility] .   Service:  Skilled Nursing Contact information: [place] [phone]               The results of significant diagnostics from this hospitalization (including imaging, microbiology, ancillary and laboratory) are listed below for reference.    Significant Diagnostic Studies: Dg Chest 2 View  Result Date: 01/18/2018 CLINICAL  DATA:  Shortness of breath. EXAM: CHEST - 2 VIEW COMPARISON:  01/14/2018, 10/17/2017 FINDINGS: There is no focal consolidation. There is mild bilateral interstitial prominence. There is no pleural effusion or pneumothorax. The heart and mediastinal contours are unremarkable. The osseous structures are unremarkable. IMPRESSION: 1. No focal consolidation to suggest pneumonia. 2. Bilateral mild interstitial thickening similar to multiple prior exams. Electronically Signed   By: Kathreen Devoid   On: 01/18/2018 12:57   Dg Chest 2 View  Result Date: 01/14/2018 CLINICAL DATA:  Pneumonia for 2 weeks. EXAM: CHEST - 2 VIEW COMPARISON:  Dec 21, 2017 FINDINGS: No pneumothorax. The opacity in the medial right lung base previously has resolved. There is mild patchy opacity in left base. Cardiomegaly. The hila and mediastinum are normal. No other acute abnormalities. IMPRESSION: Patchy left basilar infiltrate.  Recommend follow-up to resolution. Electronically Signed   By: Dorise Bullion III M.D   On: 01/14/2018 17:00   Dg Chest Port 1 View  Result Date: 12/21/2017 CLINICAL DATA:  Shortness of breath EXAM: PORTABLE CHEST 1 VIEW COMPARISON:  12/20/2017 FINDINGS: Cardiac shadow remains enlarged. Endotracheal tube and nasogastric catheter have been removed in the interval. Mild right basilar atelectasis is noted. No pneumothorax is seen. No bony abnormality is noted. IMPRESSION: Mild right basilar atelectasis. Electronically Signed   By: Inez Catalina M.D.   On: 12/21/2017 07:08    Microbiology: Recent Results (from the past 240 hour(s))  Culture, blood (Routine X 2) w Reflex to ID Panel     Status: None (Preliminary result)   Collection Time: 01/14/18  6:10 PM  Result Value Ref Range Status   Specimen Description BLOOD LEFT HAND  Final   Special Requests   Final    BOTTLES DRAWN AEROBIC ONLY Blood Culture results may not be optimal due to an inadequate volume of blood received in culture bottles   Culture   Final     NO GROWTH 4 DAYS Performed at [facility], [place] [address] [place], [place] [address]    Report Status PENDING  Incomplete  Culture, blood (Routine X 2) w Reflex to ID Panel     Status: None (Preliminary result)   Collection Time: 01/14/18  6:14 PM  Result Value Ref Range Status   Specimen Description BLOOD RIGHT HAND  Final   Special Requests   Final    BOTTLES DRAWN AEROBIC ONLY Blood Culture results may not be optimal due to an inadequate volume of blood received in culture bottles  Culture   Final    NO GROWTH 4 DAYS Performed at [facility], [place] [address], [place], [place] [address]    Report Status PENDING  Incomplete     Labs: Basic Metabolic Panel: Recent Labs  Lab 01/14/18 1607 01/15/18 0241 01/18/18 0223  NA 137 140 134*  K 4.1 3.9 3.8  CL 95* 99* 93*  CO2 33* 35* 31  GLUCOSE 80 93 161*  BUN _0 CREATININE 1.32* 1.23 1.19  CALCIUM 9.2 8.9 9.2   Liver Function Tests: Recent Labs  Lab 01/14/18 1607  AST 23  ALT 26  ALKPHOS 51  BILITOT 0.5  PROT 7.3  ALBUMIN 2.7*   No results for input(s): LIPASE, AMYLASE in the last 168 hours. No results for input(s): AMMONIA in the last 168 hours. CBC: Recent Labs  Lab 01/14/18 1607 01/15/18 0241 01/16/18 0916 01/18/18 0223  WBC 10.9* 8.4  --  18.1*  NEUTROABS 5.3 4.3  --   --   HGB 8.1* 7.4* 9.0* 8.5*  HCT 28.2* 25.5* 29.9* 28.9*  MCV 96.9 97.0  --  93.8  PLT 284 261  --  276   Cardiac Enzymes: No results for input(s): CKTOTAL, CKMB, CKMBINDEX, TROPONINI in the last 168 hours. BNP: BNP (last 3 results) Recent Labs    12/22/17 0839 12/24/17 0324 01/14/18 1607  BNP 109.0* 83.9 56.6    ProBNP (last 3 results) No results for input(s): PROBNP in the last 8760 hours.  CBG: Recent Labs  Lab 01/18/18 0743 01/18/18 1206 01/18/18 1709 01/18/18 2112 01/19/18 0758  GLUCAP 80 102* 174* 97 69       Signed:  Annita Brod, MD [facility] 01/19/2018, 10:19 AM

## 2018-01-19 NOTE — Progress Notes (Signed)
Attempted to give report to [facility]. Called the number provided and left a message with my name and contact number to be called back. Third attempt to call report.

## 2018-01-19 NOTE — Progress Notes (Signed)
[facility] will [facility] Anticipated discharge date: 6/11 Family notified:  [facility] Report [phone]  CSW signing off.  Burna SisJenna H. Toluwani Ruder, LCSW Clinical Social Worker [phone]

## 2018-01-19 NOTE — Progress Notes (Signed)
Spoke with [place] RN, she is the the RN to received Pt [place], J at [facility]. She received reports and had no further questions.

## 2018-01-19 NOTE — Progress Notes (Signed)
Attempted to contact [facility] to give report on pending transfer. Used the number provided by social work and received no answer. Will try again at a later time.

## 2018-01-19 NOTE — Progress Notes (Signed)
Patient has been discharged [facility] and is on his way to [facility]. Patient took all his belongings.

## 2018-01-19 NOTE — Progress Notes (Signed)
Thank you for the update!

## 2018-01-19 NOTE — Progress Notes (Signed)
Spoke with SW to be able to get in touch with [facility] to give report. SW was able to give an alternate number to give report, call was picked up and then transferred to nurse receiving report.  The call was not answered. Will attempt to give report again.

## 2018-01-20 ENCOUNTER — Other Ambulatory Visit: Payer: Self-pay | Admitting: *Deleted

## 2018-01-20 DIAGNOSIS — J449 Chronic obstructive pulmonary disease, unspecified: Secondary | ICD-10-CM | POA: Diagnosis not present

## 2018-01-20 DIAGNOSIS — J189 Pneumonia, unspecified organism: Secondary | ICD-10-CM | POA: Diagnosis not present

## 2018-01-20 DIAGNOSIS — E669 Obesity, unspecified: Secondary | ICD-10-CM | POA: Diagnosis not present

## 2018-01-20 DIAGNOSIS — S21002D Unspecified open wound of left breast, subsequent encounter: Secondary | ICD-10-CM | POA: Diagnosis not present

## 2018-01-20 NOTE — Consult Note (Signed)
[facility] follow up.  Chart reviewed. Noted Mr. Cavins discharged [facility] on [facility].  Will update [facility] LCSW.  Raiford NobleAtika Seara Hinesley, MSN-Ed, RN,BSN [facility] Hospital Liaison [phone]

## 2018-01-21 ENCOUNTER — Other Ambulatory Visit: Payer: Self-pay | Admitting: *Deleted

## 2018-01-21 ENCOUNTER — Other Ambulatory Visit: Payer: Self-pay

## 2018-01-21 ENCOUNTER — Inpatient Hospital Stay (HOSPITAL_COMMUNITY): Payer: Medicare HMO

## 2018-01-21 ENCOUNTER — Encounter (HOSPITAL_COMMUNITY): Payer: Self-pay | Admitting: Pharmacy Technician

## 2018-01-21 ENCOUNTER — Inpatient Hospital Stay (HOSPITAL_COMMUNITY)
Admission: EM | Admit: 2018-01-21 | Discharge: 2018-01-23 | DRG: 974 | Discharge status: Against Medical Advice | Payer: Medicare HMO | Source: Skilled Nursing Facility | Attending: Internal Medicine | Admitting: Internal Medicine

## 2018-01-21 ENCOUNTER — Emergency Department (HOSPITAL_COMMUNITY): Payer: Medicare HMO

## 2018-01-21 DIAGNOSIS — Z881 Allergy status to other antibiotic agents status: Secondary | ICD-10-CM | POA: Diagnosis not present

## 2018-01-21 DIAGNOSIS — I110 Hypertensive heart disease with heart failure: Secondary | ICD-10-CM | POA: Diagnosis not present

## 2018-01-21 DIAGNOSIS — R0602 Shortness of breath: Secondary | ICD-10-CM | POA: Diagnosis not present

## 2018-01-21 DIAGNOSIS — R140 Abdominal distension (gaseous): Secondary | ICD-10-CM

## 2018-01-21 DIAGNOSIS — Z8701 Personal history of pneumonia (recurrent): Secondary | ICD-10-CM | POA: Diagnosis not present

## 2018-01-21 DIAGNOSIS — J189 Pneumonia, unspecified organism: Secondary | ICD-10-CM | POA: Diagnosis not present

## 2018-01-21 DIAGNOSIS — J9621 Acute and chronic respiratory failure with hypoxia: Secondary | ICD-10-CM | POA: Diagnosis present

## 2018-01-21 DIAGNOSIS — Z7901 Long term (current) use of anticoagulants: Secondary | ICD-10-CM | POA: Diagnosis not present

## 2018-01-21 DIAGNOSIS — R000 Tachycardia, unspecified: Secondary | ICD-10-CM | POA: Diagnosis not present

## 2018-01-21 DIAGNOSIS — J449 Chronic obstructive pulmonary disease, unspecified: Secondary | ICD-10-CM | POA: Diagnosis not present

## 2018-01-21 DIAGNOSIS — R410 Disorientation, unspecified: Secondary | ICD-10-CM | POA: Diagnosis not present

## 2018-01-21 DIAGNOSIS — I5030 Unspecified diastolic (congestive) heart failure: Secondary | ICD-10-CM

## 2018-01-21 DIAGNOSIS — K219 Gastro-esophageal reflux disease without esophagitis: Secondary | ICD-10-CM | POA: Diagnosis present

## 2018-01-21 DIAGNOSIS — Z836 Family history of other diseases of the respiratory system: Secondary | ICD-10-CM

## 2018-01-21 DIAGNOSIS — R69 Illness, unspecified: Secondary | ICD-10-CM | POA: Diagnosis not present

## 2018-01-21 DIAGNOSIS — F329 Major depressive disorder, single episode, unspecified: Secondary | ICD-10-CM | POA: Diagnosis present

## 2018-01-21 DIAGNOSIS — Z833 Family history of diabetes mellitus: Secondary | ICD-10-CM

## 2018-01-21 DIAGNOSIS — A419 Sepsis, unspecified organism: Principal | ICD-10-CM | POA: Diagnosis present

## 2018-01-21 DIAGNOSIS — B20 Human immunodeficiency virus [HIV] disease: Secondary | ICD-10-CM | POA: Diagnosis present

## 2018-01-21 DIAGNOSIS — Z9981 Dependence on supplemental oxygen: Secondary | ICD-10-CM

## 2018-01-21 DIAGNOSIS — J181 Lobar pneumonia, unspecified organism: Secondary | ICD-10-CM | POA: Diagnosis not present

## 2018-01-21 DIAGNOSIS — R5381 Other malaise: Secondary | ICD-10-CM | POA: Diagnosis present

## 2018-01-21 DIAGNOSIS — I480 Paroxysmal atrial fibrillation: Secondary | ICD-10-CM | POA: Diagnosis not present

## 2018-01-21 DIAGNOSIS — I5032 Chronic diastolic (congestive) heart failure: Secondary | ICD-10-CM | POA: Diagnosis present

## 2018-01-21 DIAGNOSIS — E119 Type 2 diabetes mellitus without complications: Secondary | ICD-10-CM | POA: Diagnosis not present

## 2018-01-21 DIAGNOSIS — J440 Chronic obstructive pulmonary disease with acute lower respiratory infection: Secondary | ICD-10-CM | POA: Diagnosis present

## 2018-01-21 DIAGNOSIS — Z794 Long term (current) use of insulin: Secondary | ICD-10-CM | POA: Diagnosis not present

## 2018-01-21 DIAGNOSIS — R0689 Other abnormalities of breathing: Secondary | ICD-10-CM | POA: Diagnosis not present

## 2018-01-21 DIAGNOSIS — J9601 Acute respiratory failure with hypoxia: Secondary | ICD-10-CM | POA: Diagnosis present

## 2018-01-21 DIAGNOSIS — I10 Essential (primary) hypertension: Secondary | ICD-10-CM | POA: Diagnosis not present

## 2018-01-21 DIAGNOSIS — Y95 Nosocomial condition: Secondary | ICD-10-CM | POA: Diagnosis not present

## 2018-01-21 DIAGNOSIS — R509 Fever, unspecified: Secondary | ICD-10-CM | POA: Diagnosis not present

## 2018-01-21 DIAGNOSIS — R0902 Hypoxemia: Secondary | ICD-10-CM | POA: Diagnosis not present

## 2018-01-21 DIAGNOSIS — Z87891 Personal history of nicotine dependence: Secondary | ICD-10-CM

## 2018-01-21 DIAGNOSIS — R062 Wheezing: Secondary | ICD-10-CM | POA: Diagnosis not present

## 2018-01-21 DIAGNOSIS — Z8249 Family history of ischemic heart disease and other diseases of the circulatory system: Secondary | ICD-10-CM

## 2018-01-21 DIAGNOSIS — J219 Acute bronchiolitis, unspecified: Secondary | ICD-10-CM | POA: Diagnosis not present

## 2018-01-21 LAB — URINALYSIS, ROUTINE W REFLEX MICROSCOPIC
BILIRUBIN URINE: NEGATIVE
Glucose, UA: 50 mg/dL — AB
HGB URINE DIPSTICK: NEGATIVE
Ketones, ur: NEGATIVE mg/dL
Leukocytes, UA: NEGATIVE
Nitrite: NEGATIVE
Protein, ur: NEGATIVE mg/dL
Specific Gravity, Urine: 1.013 (ref 1.005–1.030)
pH: 6 (ref 5.0–8.0)

## 2018-01-21 LAB — I-STAT ARTERIAL BLOOD GAS, ED
ACID-BASE EXCESS: 5 mmol/L — AB (ref 0.0–2.0)
Bicarbonate: 30.4 mmol/L — ABNORMAL HIGH (ref 20.0–28.0)
O2 SAT: 96 %
PCO2 ART: 48.7 mmHg — AB (ref 32.0–48.0)
PH ART: 7.403 (ref 7.350–7.450)
Patient temperature: 98.6
TCO2: 32 mmol/L (ref 22–32)
pO2, Arterial: 85 mmHg (ref 83.0–108.0)

## 2018-01-21 LAB — CBC WITH DIFFERENTIAL/PLATELET
Band Neutrophils: 0 %
Basophils Absolute: 0 10*3/uL (ref 0.0–0.1)
Basophils Relative: 0 %
Blasts: 0 %
Eosinophils Absolute: 0 10*3/uL (ref 0.0–0.7)
Eosinophils Relative: 0 %
HCT: 30.7 % — ABNORMAL LOW (ref 39.0–52.0)
HEMOGLOBIN: 8.8 g/dL — AB (ref 13.0–17.0)
Lymphocytes Relative: 12 %
Lymphs Abs: 2.7 10*3/uL (ref 0.7–4.0)
MCH: 27.6 pg (ref 26.0–34.0)
MCHC: 28.7 g/dL — ABNORMAL LOW (ref 30.0–36.0)
MCV: 96.2 fL (ref 78.0–100.0)
MONO ABS: 1.6 10*3/uL — AB (ref 0.1–1.0)
MYELOCYTES: 0 %
Metamyelocytes Relative: 0 %
Monocytes Relative: 7 %
NEUTROS PCT: 81 %
NRBC: 0 /100{WBCs}
Neutro Abs: 18.5 10*3/uL — ABNORMAL HIGH (ref 1.7–7.7)
Other: 0 %
PROMYELOCYTES RELATIVE: 0 %
Platelets: 309 10*3/uL (ref 150–400)
RBC: 3.19 MIL/uL — AB (ref 4.22–5.81)
RDW: 13.8 % (ref 11.5–15.5)
WBC: 22.8 10*3/uL — AB (ref 4.0–10.5)

## 2018-01-21 LAB — LACTATE DEHYDROGENASE: LDH: 301 U/L — ABNORMAL HIGH (ref 98–192)

## 2018-01-21 LAB — I-STAT CG4 LACTIC ACID, ED: Lactic Acid, Venous: 1.9 mmol/L (ref 0.5–1.9)

## 2018-01-21 LAB — COMPREHENSIVE METABOLIC PANEL
ALT: 34 U/L (ref 17–63)
AST: 32 U/L (ref 15–41)
Albumin: 2.5 g/dL — ABNORMAL LOW (ref 3.5–5.0)
Alkaline Phosphatase: 59 U/L (ref 38–126)
Anion gap: 13 (ref 5–15)
BILIRUBIN TOTAL: 0.4 mg/dL (ref 0.3–1.2)
BUN: 16 mg/dL (ref 6–20)
CO2: 31 mmol/L (ref 22–32)
Calcium: 9.3 mg/dL (ref 8.9–10.3)
Chloride: 95 mmol/L — ABNORMAL LOW (ref 101–111)
Creatinine, Ser: 1.17 mg/dL (ref 0.61–1.24)
GFR calc Af Amer: >60 mL/min (ref >60)
GFR calc non Af Amer: >60 mL/min (ref >60)
Glucose, Bld: 130 mg/dL — ABNORMAL HIGH (ref 65–99)
POTASSIUM: 4 mmol/L (ref 3.5–5.1)
Sodium: 139 mmol/L (ref 135–145)
TOTAL PROTEIN: 7.6 g/dL (ref 6.5–8.1)

## 2018-01-21 LAB — I-STAT TROPONIN, ED: TROPONIN I, POC: 0 ng/mL (ref 0.00–0.08)

## 2018-01-21 LAB — GLUCOSE, CAPILLARY
GLUCOSE-CAPILLARY: 274 mg/dL — AB (ref 65–99)
Glucose-Capillary: 342 mg/dL — ABNORMAL HIGH (ref 65–99)
Glucose-Capillary: 357 mg/dL — ABNORMAL HIGH (ref 65–99)

## 2018-01-21 LAB — SEDIMENTATION RATE: SED RATE: 137 mm/h — AB (ref 0–16)

## 2018-01-21 LAB — STREP PNEUMONIAE URINARY ANTIGEN: STREP PNEUMO URINARY ANTIGEN: NEGATIVE

## 2018-01-21 LAB — BRAIN NATRIURETIC PEPTIDE: B NATRIURETIC PEPTIDE 5: 76.5 pg/mL (ref 0.0–100.0)

## 2018-01-21 MED ORDER — OXYCODONE HCL 5 MG PO TABS
5.0000 mg | ORAL_TABLET | Freq: Once | ORAL | Status: AC
  Administered 2018-01-21: 5 mg via ORAL
  Filled 2018-01-21: qty 1

## 2018-01-21 MED ORDER — DAPSONE 100 MG PO TABS
100.0000 mg | ORAL_TABLET | Freq: Every day | ORAL | Status: DC
  Administered 2018-01-21: 100 mg via ORAL
  Filled 2018-01-21: qty 1

## 2018-01-21 MED ORDER — LEVALBUTEROL HCL 0.63 MG/3ML IN NEBU
0.6300 mg | INHALATION_SOLUTION | Freq: Three times a day (TID) | RESPIRATORY_TRACT | Status: DC
  Administered 2018-01-21: 0.63 mg via RESPIRATORY_TRACT
  Filled 2018-01-21: qty 3

## 2018-01-21 MED ORDER — ISOSORBIDE MONONITRATE ER 30 MG PO TB24
30.0000 mg | ORAL_TABLET | Freq: Every day | ORAL | Status: DC
  Administered 2018-01-22 – 2018-01-23 (×2): 30 mg via ORAL
  Filled 2018-01-21 (×2): qty 1

## 2018-01-21 MED ORDER — BICTEGRAVIR-EMTRICITAB-TENOFOV 50-200-25 MG PO TABS
1.0000 | ORAL_TABLET | Freq: Every day | ORAL | Status: DC
  Administered 2018-01-21 – 2018-01-22 (×2): 1 via ORAL
  Filled 2018-01-21 (×4): qty 1

## 2018-01-21 MED ORDER — LEVALBUTEROL HCL 0.63 MG/3ML IN NEBU
0.6300 mg | INHALATION_SOLUTION | Freq: Two times a day (BID) | RESPIRATORY_TRACT | Status: DC
  Administered 2018-01-22: 0.63 mg via RESPIRATORY_TRACT
  Filled 2018-01-21: qty 3

## 2018-01-21 MED ORDER — TIOTROPIUM BROMIDE MONOHYDRATE 18 MCG IN CAPS
18.0000 ug | ORAL_CAPSULE | Freq: Every day | RESPIRATORY_TRACT | Status: DC
  Administered 2018-01-22 – 2018-01-23 (×2): 18 ug via RESPIRATORY_TRACT
  Filled 2018-01-21: qty 5

## 2018-01-21 MED ORDER — TIOTROPIUM BROMIDE MONOHYDRATE 2.5 MCG/ACT IN AERS: 2.0000 | INHALATION_SPRAY | Freq: Every day | RESPIRATORY_TRACT | Status: DC

## 2018-01-21 MED ORDER — VANCOMYCIN HCL 10 G IV SOLR
2000.0000 mg | Freq: Once | INTRAVENOUS | Status: DC
  Filled 2018-01-21: qty 2000

## 2018-01-21 MED ORDER — DAPSONE 100 MG PO TABS
100.0000 mg | ORAL_TABLET | Freq: Every day | ORAL | Status: DC
  Administered 2018-01-22 – 2018-01-23 (×2): 100 mg via ORAL
  Filled 2018-01-21 (×2): qty 1

## 2018-01-21 MED ORDER — INSULIN ASPART 100 UNIT/ML ~~LOC~~ SOLN
0.0000 [IU] | Freq: Three times a day (TID) | SUBCUTANEOUS | Status: DC
  Administered 2018-01-21: 11 [IU] via SUBCUTANEOUS
  Administered 2018-01-22 (×2): 3 [IU] via SUBCUTANEOUS
  Administered 2018-01-22: 11 [IU] via SUBCUTANEOUS
  Administered 2018-01-23 (×2): 5 [IU] via SUBCUTANEOUS

## 2018-01-21 MED ORDER — MOMETASONE FURO-FORMOTEROL FUM 200-5 MCG/ACT IN AERO
2.0000 | INHALATION_SPRAY | Freq: Two times a day (BID) | RESPIRATORY_TRACT | Status: DC
  Administered 2018-01-21: 2 via RESPIRATORY_TRACT
  Filled 2018-01-21: qty 8.8

## 2018-01-21 MED ORDER — METHYLPREDNISOLONE SODIUM SUCC 125 MG IJ SOLR
125.0000 mg | Freq: Once | INTRAMUSCULAR | Status: AC
  Administered 2018-01-21: 125 mg via INTRAVENOUS
  Filled 2018-01-21: qty 2

## 2018-01-21 MED ORDER — INSULIN DETEMIR 100 UNIT/ML ~~LOC~~ SOLN
30.0000 [IU] | Freq: Every day | SUBCUTANEOUS | Status: DC
  Administered 2018-01-21 – 2018-01-22 (×2): 30 [IU] via SUBCUTANEOUS
  Filled 2018-01-21 (×3): qty 0.3

## 2018-01-21 MED ORDER — HYDRALAZINE HCL 25 MG PO TABS
50.0000 mg | ORAL_TABLET | Freq: Three times a day (TID) | ORAL | Status: DC
  Administered 2018-01-21 – 2018-01-23 (×5): 50 mg via ORAL
  Filled 2018-01-21 (×5): qty 2

## 2018-01-21 MED ORDER — AMIODARONE HCL 200 MG PO TABS
200.0000 mg | ORAL_TABLET | Freq: Every day | ORAL | Status: DC
  Administered 2018-01-22 – 2018-01-23 (×2): 200 mg via ORAL
  Filled 2018-01-21 (×2): qty 1

## 2018-01-21 MED ORDER — CEFEPIME HCL 2 G IJ SOLR
2.0000 g | Freq: Two times a day (BID) | INTRAMUSCULAR | Status: DC
  Administered 2018-01-21 – 2018-01-22 (×2): 2 g via INTRAVENOUS
  Filled 2018-01-21 (×2): qty 2

## 2018-01-21 MED ORDER — PIPERACILLIN-TAZOBACTAM 3.375 G IVPB: 3.3750 g | Freq: Three times a day (TID) | INTRAVENOUS | Status: DC

## 2018-01-21 MED ORDER — METHYLPREDNISOLONE SODIUM SUCC 125 MG IJ SOLR
60.0000 mg | Freq: Two times a day (BID) | INTRAMUSCULAR | Status: DC
  Administered 2018-01-21 – 2018-01-22 (×2): 60 mg via INTRAVENOUS
  Filled 2018-01-21 (×2): qty 2

## 2018-01-21 MED ORDER — DILTIAZEM HCL ER COATED BEADS 240 MG PO CP24
240.0000 mg | ORAL_CAPSULE | Freq: Every day | ORAL | Status: DC
  Administered 2018-01-22 – 2018-01-23 (×2): 240 mg via ORAL
  Filled 2018-01-21 (×2): qty 1

## 2018-01-21 MED ORDER — PIPERACILLIN-TAZOBACTAM 3.375 G IVPB 30 MIN
3.3750 g | Freq: Once | INTRAVENOUS | Status: AC
  Administered 2018-01-21: 3.375 g via INTRAVENOUS
  Filled 2018-01-21: qty 50

## 2018-01-21 MED ORDER — GABAPENTIN 300 MG PO CAPS
300.0000 mg | ORAL_CAPSULE | Freq: Three times a day (TID) | ORAL | Status: DC
  Administered 2018-01-21 – 2018-01-23 (×6): 300 mg via ORAL
  Filled 2018-01-21 (×6): qty 1

## 2018-01-21 MED ORDER — INSULIN ASPART 100 UNIT/ML ~~LOC~~ SOLN
0.0000 [IU] | Freq: Every day | SUBCUTANEOUS | Status: DC
  Administered 2018-01-21: 3 [IU] via SUBCUTANEOUS
  Administered 2018-01-22: 2 [IU] via SUBCUTANEOUS

## 2018-01-21 MED ORDER — GUAIFENESIN ER 600 MG PO TB12
1200.0000 mg | ORAL_TABLET | Freq: Two times a day (BID) | ORAL | Status: DC
  Administered 2018-01-21 – 2018-01-23 (×4): 1200 mg via ORAL
  Filled 2018-01-21 (×4): qty 2

## 2018-01-21 MED ORDER — APIXABAN 5 MG PO TABS
5.0000 mg | ORAL_TABLET | Freq: Two times a day (BID) | ORAL | Status: DC
  Administered 2018-01-21 – 2018-01-23 (×4): 5 mg via ORAL
  Filled 2018-01-21 (×4): qty 1

## 2018-01-21 MED ORDER — ACETAMINOPHEN 325 MG PO TABS: 650.0000 mg | ORAL_TABLET | ORAL | Status: DC | PRN

## 2018-01-21 MED ORDER — VANCOMYCIN HCL IN DEXTROSE 750-5 MG/150ML-% IV SOLN: 750.0000 mg | Freq: Two times a day (BID) | INTRAVENOUS | Status: DC

## 2018-01-21 MED ORDER — AMITRIPTYLINE HCL 25 MG PO TABS
100.0000 mg | ORAL_TABLET | Freq: Every day | ORAL | Status: DC
  Administered 2018-01-21 – 2018-01-22 (×2): 100 mg via ORAL
  Filled 2018-01-21 (×2): qty 4

## 2018-01-21 NOTE — ED Notes (Signed)
Patient transported to CT 

## 2018-01-21 NOTE — ED Triage Notes (Signed)
Pt arrives via EMS from [facility] with reports of oxygen saturations on his normal 2L [place] at 78%. Pt with recent intubation for same. Given 2G rocephin IM and 2 albuterol tx at facility. EMS gave a duoneb en route. Pt denies feeling sob althogh increased WOB. Pt talking in 2 word fragmented sentences.  BP 166/94 Resp 22 95% on neb CBG 146 P 116 100.3

## 2018-01-21 NOTE — ED Notes (Signed)
EDP aware no IV access at this time. IV team is at the bedside.
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
## 2018-01-21 NOTE — Patient Outreach (Signed)
[facility] Care Management  01/21/2018  Chris FavorsJerome A Carmack Jr. 12/01/1965 [id number]   CSW was able to [facility] patient currently resides to receive short-term rehabilitative services, to perform a routine visit.  Patient appeared to be in good spirits and his coloring looked much better.  Patient admits to feeling better, as his symptoms of Pneumonia have subsided.  Patient reports that he is very motivated to work with therapies, both physical and occupational, because he is ready to return home as soon as possible.  Patient reported that they have not mentioned anything about a 72-hour meeting yet; therefore, he is not aware of a tentative discharge date.  Patient endorses that he plans to return home to live with wife at time of discharge.  Patient is agreeable to home health services being arranged and durable medical equipment ordered, if recommended by his therapists.  Patient has been encouraged to wear his portable oxygen around the clock, and especially while he is working with therapies, because he becomes extremely short-of-breath and begins to cough.  CSW agreed to follow-up with patient again next week to assess and assist with discharge planning needs and services. Danford BadJoanna Saporito, BSW, MSW, LCSW  Licensed Restaurant manager, fast foodClinical Social Worker  [facility] Care Management [facility]  [address] [place] [address], [place], [place] [address] Physical Address-[address], [place], [place] [address] Toll Free Main # [phone] Fax # [phone] Cell # [phone]  Office # [phone] Mardene [email or web address]
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
## 2018-01-21 NOTE — Progress Notes (Addendum)
Pharmacy Antibiotic Note  Chris FavorsJerome A Poarch [name] is a 52 y.o. male admitted on 01/21/2018 with sepsis.  Pharmacy has been consulted for vancomycin and Zosyn dosing. Pt recently discharged after treatment with vancomycin and cefepime for several days.  Plan: -Vancomycin 2000mg  IV x1 then 750mg  IV q12hr -Zosyn 3.375g IV over 30 min x1 then 3.375g IV q8h EI -Monitor renal function, cultures, LOT -Obtain vancomycin level as indicated  ADDENDUM: Transitioning to cefepime monotherapy per admitting MD  Plan: -Cefepime 2g IV q12h -Stop vanco/Zosyn   Height: 5\' 9"  (175.3 cm) Weight: 228 lb (103.4 kg) IBW/kg (Calculated) : 70.7  No data recorded.  Recent Labs  Lab 01/14/18 1607 01/14/18 1627 01/14/18 1815 01/15/18 0241 01/18/18 0223  WBC 10.9*  --   --  8.4 18.1*  CREATININE 1.32*  --   --  1.23 1.19  LATICACIDVEN  --  1.38 1.10  --   --     Estimated Creatinine Clearance: 87 mL/min (by C-G formula based on SCr of 1.19 mg/dL).    Allergies  Allergen Reactions  . Bactrim [Sulfamethoxazole-Trimethoprim] Hives    Antimicrobials this admission: Vancomycin 6/13 >> Zosyn 6/13 >>  Dose adjustments this admission: none  Microbiology results: collected  Thank you for allowing pharmacy to be a part of this patient's care.  Fredonia HighlandMichael Bitonti, PharmD, BCPS PGY-2 Cardiology Pharmacy Resident Phone: [phone] 01/21/2018

## 2018-01-21 NOTE — Consult Note (Signed)
[facility]    Date of Admission:  01/21/2018   Total days of antibiotics 0        Day Cefepime                Reason for Consult: HIV/AIDS, Cough, ?PNA    Referring Provider: Evangeline Gula  Primary Care Provider: [name], NP   Assessment: 52 y.o. male with HIV/AIDS and severe COPD with chronic respiratory failure with several recent admissions for hypoxia due to COPD flare vs PNA. He previously has had well controlled HIV on Biktarvy through February of this year when it was noted his CD4 count dropped to 40-60 in the setting of recurrent pulmonary illnesses that started with influenzae earlier this year in February. I am not certain if he is currently undergoing another flare or if he is developing pneumonia - his wbc is elevated and his baseline oxygen requirement is elevated to 6LPM presently. His stomach is also quite more distended than I recall at previous encounters --> ?diaphragm compression and atelectasis. Would treat with cefepime to cover HCAP until we can watch him further. Chest CT would be helpful to evaluate as well. He has been on PJP prophylaxis with Dapsone at [facility] as well as his Biktarvy. Previously with negative pneumocystis smear 33mprior.   Plan: 1. Dyspnea = acute on chronic COPD vs HCAP vs atelectasis. Continue cefepime. Chest CT to further evaluate.   2. HIV/AIDS = last CD4 60. Continue Biktarvy once a day with Dapsone for OI prophylaxis.    3. Abdominal Distension = Check KUB? Air-trapping d/t pulmonary disease?   Principal Problem:   Acute and chronic respiratory failure with hypoxia  Active Problems:   Left lower lobe pneumonia/HCAP   Human immunodeficiency virus (HIV) disease /CD4 60 May 2019   COPD (chronic obstructive pulmonary disease) (HCC)   Diabetes mellitus type 2, insulin dependent (HCC)   Hypertension/chronic diastolic heart failure   Paroxysmal atrial fibrillation ([place]   Physical deconditioning   Sepsis ([place]   . amiodarone  200 mg Oral Daily  . amitriptyline  100 mg Oral QHS  . apixaban  5 mg Oral BID  . bictegravir-emtricitabine-tenofovir AF  1 tablet Oral Q1500  . dapsone  100 mg Oral Daily  . [START ON 01/22/2018] dapsone  100 mg Oral Q breakfast  . diltiazem  240 mg Oral Daily  . gabapentin  300 mg Oral TID  . guaiFENesin  1,200 mg Oral BID  . hydrALAZINE  50 mg Oral Q8H  . insulin aspart  0-15 Units Subcutaneous TID WC  . insulin aspart  0-5 Units Subcutaneous QHS  . Insulin Detemir  30 Units Subcutaneous Q2200  . isosorbide mononitrate  30 mg Oral Daily  . levalbuterol  0.63 mg Nebulization Q8H  . methylPREDNISolone (SOLU-MEDROL) injection  60 mg Intravenous Q12H  . mometasone-formoterol  2 puff Inhalation BID  . Tiotropium Bromide Monohydrate  2 puff Inhalation Daily    HPI: Chris [name] a 52y.o. male with past medical history significant for COPD (GOLD III), chronic respiratory failure, HTN, dCHF, DM, chronic anticoagulation AFib and HIV/AIDS (last CD4 in setting of illness was 60, VL < 20). He was recently discharged from the hospital 01/19/18 after he was treated for 5 days for hypoxia and presumed HCAP; he received 6d of IV antibiotics - CXR revealed no consolidation at that time. [facility] 2LPM. He was seen by ID team during May admission - pneumocystis smear was negative at that time per BAL.   He was sent to the ER here at [facility] again today from [facility] due to respiratory distress - it was reported that staff noted his oxygen saturations were in the 70%'s and required 5 LPM oxygen where he was still not able to maintain these. He tells me he has been taking his Biktarvy and Dapsone (although he was not able to tell me where he came from prior to arrival today). He is not having fevers, he reports no worsened cough but had trouble breathing earlier. He is telling me he needs to use the bathroom - stomach  is distended but not sure if more than normal.   Review of Systems: Review of Systems  Constitutional: Negative for chills, fever, malaise/fatigue and weight loss.  HENT: Negative for sore throat.        No dental problems  Respiratory: Negative for cough and sputum production.   Cardiovascular: Negative for chest pain and leg swelling.  Gastrointestinal: Negative for abdominal pain, diarrhea and vomiting.  Genitourinary: Negative for dysuria and flank pain.  Musculoskeletal: Negative for joint pain, myalgias and neck pain.  Skin: Negative for rash.  Neurological: Negative for dizziness, tingling and headaches.  Psychiatric/Behavioral: Negative for depression and substance abuse. The patient is not nervous/anxious and does not have insomnia.     Past Medical History:  Diagnosis Date  . Anxiety   . Atrial fibrillation ([place])   . CHF (congestive heart failure) ([place])   . COPD (chronic obstructive pulmonary disease) ([place])    Emphysema [[name]]  . Depression   . Diabetes mellitus without complication ([place])   . Emphysema of lung ([place])   . GERD (gastroesophageal reflux disease)   . HIV disease ([place])   . Hypertension   . Oxygen deficiency     Social History   Tobacco Use  . Smoking status: Former Smoker    Packs/day: 0.50    Years: 40.00    Pack years: 20.00    Types: Cigarettes  . Smokeless tobacco: Never Used  . Tobacco comment: quit after last hospitalization  Substance Use Topics  . Alcohol use: No    Alcohol/week: 0.0 oz  . Drug use: No    Comment: quit 04    Family History  Problem Relation Age of Onset  . Throat cancer Father 61  . Emphysema Maternal Uncle        was a smoker  . Diabetes Maternal Uncle   . Heart disease Maternal Uncle   . COPD Maternal Uncle   . Asthma Maternal Uncle   . Diabetes Maternal Uncle   . Asthma Maternal Aunt   . Diabetes Maternal Aunt   . Heart disease Maternal Aunt   . Throat cancer Maternal Grandmother        never smoker,  used snuff  . Diabetes Maternal Grandmother   . Breast cancer Maternal Aunt    Allergies  Allergen Reactions  . Bactrim [Sulfamethoxazole-Trimethoprim] Hives    OBJECTIVE: Blood pressure (!) 139/109, pulse (!) 105, resp. rate (!) 24, height _0  (1.753 m), weight 228 lb (103.4 kg), SpO2 90 %.  Physical Exam  Constitutional: He is oriented to person, place, and time. He appears well-developed and well-nourished.  Lying flat on stretcher. Does not appear very comfortably but is breathing relatively easily.   HENT:  Mouth/Throat: Oropharynx is clear and moist and  mucous membranes are normal. Normal dentition. No dental abscesses.  Cardiovascular: Normal rate, regular rhythm and normal heart sounds.  No murmur heard. Pulmonary/Chest: Effort normal and breath sounds normal.  Overall decreased over bases, no wheezing  Abdominal: Soft. He exhibits distension. There is no tenderness.  Lymphadenopathy:    He has no cervical adenopathy.  Neurological: He is alert and oriented to person, place, and time.  Skin: Skin is warm and dry. No rash noted.  Psychiatric: He has a normal mood and affect. Judgment normal.  Seems a little confused  Nursing note and vitals reviewed.   Lab Results Lab Results  Component Value Date   WBC 22.8 (H) 01/21/2018   HGB 8.8 (L) 01/21/2018   HCT 30.7 (L) 01/21/2018   MCV 96.2 01/21/2018   PLT 309 01/21/2018    Lab Results  Component Value Date   CREATININE 1.17 01/21/2018   BUN 16 01/21/2018   NA 139 01/21/2018   K 4.0 01/21/2018   CL 95 (L) 01/21/2018   CO2 31 01/21/2018    Lab Results  Component Value Date   ALT 34 01/21/2018   AST 32 01/21/2018   ALKPHOS 59 01/21/2018   BILITOT 0.4 01/21/2018     Microbiology: Recent Results (from the past 240 hour(s))  Culture, blood (Routine X 2) w Reflex to ID Panel     Status: None   Collection Time: 01/14/18  6:10 PM  Result Value Ref Range Status   Specimen Description BLOOD LEFT HAND  Final    Special Requests   Final    BOTTLES DRAWN AEROBIC ONLY Blood Culture results may not be optimal due to an inadequate volume of blood received in culture bottles   Culture   Final    NO GROWTH 5 DAYS Performed at [facility], [place] [address] [place], [place] [address]    Report Status 01/19/2018 FINAL  Final  Culture, blood (Routine X 2) w Reflex to ID Panel     Status: None   Collection Time: 01/14/18  6:14 PM  Result Value Ref Range Status   Specimen Description BLOOD RIGHT HAND  Final   Special Requests   Final    BOTTLES DRAWN AEROBIC ONLY Blood Culture results may not be optimal due to an inadequate volume of blood received in culture bottles   Culture   Final    NO GROWTH 5 DAYS Performed at [facility], [place] [address], [place], [place] [address]    Report Status 01/19/2018 FINAL  Final    Janene Madeira, MSN, NP-C [facility] Cell: [phone] Pager: [phone]  01/21/2018 3:56 PM

## 2018-01-21 NOTE — ED Notes (Signed)
Pt c/o headache. MD made aware of events and recommend tylenol.  Repage if tylenol does not work.

## 2018-01-22 DIAGNOSIS — J219 Acute bronchiolitis, unspecified: Secondary | ICD-10-CM

## 2018-01-22 DIAGNOSIS — R0902 Hypoxemia: Secondary | ICD-10-CM

## 2018-01-22 DIAGNOSIS — I5032 Chronic diastolic (congestive) heart failure: Secondary | ICD-10-CM

## 2018-01-22 DIAGNOSIS — J189 Pneumonia, unspecified organism: Secondary | ICD-10-CM

## 2018-01-22 DIAGNOSIS — R5381 Other malaise: Secondary | ICD-10-CM

## 2018-01-22 DIAGNOSIS — A419 Sepsis, unspecified organism: Principal | ICD-10-CM

## 2018-01-22 LAB — GLUCOSE, CAPILLARY
Glucose-Capillary: 179 mg/dL — ABNORMAL HIGH (ref 65–99)
Glucose-Capillary: 164 mg/dL — ABNORMAL HIGH (ref 65–99)
Glucose-Capillary: 349 mg/dL — ABNORMAL HIGH (ref 65–99)
Glucose-Capillary: 218 mg/dL — ABNORMAL HIGH (ref 65–99)

## 2018-01-22 LAB — RESPIRATORY PANEL BY PCR
Adenovirus: NOT DETECTED
Bordetella pertussis: NOT DETECTED
CORONAVIRUS 229E-RVPPCR: NOT DETECTED
CORONAVIRUS HKU1-RVPPCR: NOT DETECTED
CORONAVIRUS OC43-RVPPCR: NOT DETECTED
Chlamydophila pneumoniae: NOT DETECTED
Coronavirus NL63: NOT DETECTED
Influenza A: NOT DETECTED
Influenza B: NOT DETECTED
METAPNEUMOVIRUS-RVPPCR: NOT DETECTED
Mycoplasma pneumoniae: NOT DETECTED
PARAINFLUENZA VIRUS 1-RVPPCR: NOT DETECTED
Parainfluenza Virus 2: NOT DETECTED
Parainfluenza Virus 3: NOT DETECTED
Parainfluenza Virus 4: NOT DETECTED
Respiratory Syncytial Virus: NOT DETECTED
Rhinovirus / Enterovirus: NOT DETECTED

## 2018-01-22 MED ORDER — FUROSEMIDE 40 MG PO TABS
40.0000 mg | ORAL_TABLET | Freq: Two times a day (BID) | ORAL | Status: DC
  Administered 2018-01-22 – 2018-01-23 (×2): 40 mg via ORAL
  Filled 2018-01-22 (×2): qty 1

## 2018-01-22 MED ORDER — DOCUSATE SODIUM 100 MG PO CAPS: 100.0000 mg | ORAL_CAPSULE | Freq: Every day | ORAL | Status: DC | PRN

## 2018-01-22 MED ORDER — SIMETHICONE 80 MG PO CHEW
80.0000 mg | CHEWABLE_TABLET | Freq: Four times a day (QID) | ORAL | Status: DC | PRN
  Administered 2018-01-22 – 2018-01-23 (×2): 80 mg via ORAL
  Filled 2018-01-22 (×2): qty 1

## 2018-01-22 MED ORDER — SODIUM CHLORIDE 0.9 % IV SOLN
2.0000 g | INTRAVENOUS | Status: DC
  Administered 2018-01-22: 2 g via INTRAVENOUS
  Filled 2018-01-22 (×2): qty 20

## 2018-01-22 MED ORDER — MOMETASONE FURO-FORMOTEROL FUM 200-5 MCG/ACT IN AERO
2.0000 | INHALATION_SPRAY | Freq: Two times a day (BID) | RESPIRATORY_TRACT | Status: DC
  Administered 2018-01-22 – 2018-01-23 (×3): 2 via RESPIRATORY_TRACT
  Filled 2018-01-22: qty 8.8

## 2018-01-22 MED ORDER — METHYLPREDNISOLONE SODIUM SUCC 40 MG IJ SOLR
40.0000 mg | Freq: Two times a day (BID) | INTRAMUSCULAR | Status: DC
  Administered 2018-01-22 – 2018-01-23 (×2): 40 mg via INTRAVENOUS
  Filled 2018-01-22 (×2): qty 1

## 2018-01-22 MED ORDER — LEVALBUTEROL HCL 0.63 MG/3ML IN NEBU: 0.6300 mg | INHALATION_SOLUTION | Freq: Four times a day (QID) | RESPIRATORY_TRACT | Status: DC | PRN

## 2018-01-22 MED ORDER — POTASSIUM CHLORIDE CRYS ER 20 MEQ PO TBCR
20.0000 meq | EXTENDED_RELEASE_TABLET | Freq: Every day | ORAL | Status: DC
  Administered 2018-01-22 – 2018-01-23 (×2): 20 meq via ORAL
  Filled 2018-01-22 (×2): qty 1

## 2018-01-22 NOTE — Progress Notes (Signed)
PROGRESS NOTE   Chris [name].  KXF:[id number]    DOB: [birth year]    DOA: 01/21/2018  PCP: [name], NP   I have briefly reviewed patients previous medical records in [facility].  Brief Narrative:  51 year old male with PMH of HIV/AIDS, COPD, chronic hypoxic respiratory failure?  PRN oxygen, HTN, chronic diastolic CHF, DM 2, A. fib on Eliquis, tobacco abuse, recent influenza induced ARDS requiring tracheostomy intubation, recent frequent hospitalizations for hypoxia due to COPD flare versus pneumonia, most recent hospitalization 01/14/2018-01/19/2018 for acute on chronic hypoxic respiratory failure secondary to HCAP, completed 6 days of IV antibiotics and discharged to [facility], presented to the ED on 01/21/2018 due to progressive dyspnea, hypoxia with oxygen saturations in the 70s requiring 5 L/min oxygen.  Admitted again for suspected HCAP.  Infectious disease consulting.   Assessment & Plan:   Principal Problem:   Acute and chronic respiratory failure with hypoxia  Active Problems:   Left lower lobe pneumonia/HCAP   Human immunodeficiency virus (HIV) disease /CD4 60 May 2019   COPD (chronic obstructive pulmonary disease) (HCC)   Diabetes mellitus type 2, insulin dependent (HCC)   Hypertension/chronic diastolic heart failure   Paroxysmal atrial fibrillation (HCC)   Physical deconditioning   Sepsis ([place])   HCAP: CT chest without contrast 01/21/2018 showed multifocal pneumonia and bronchiolitis.  RSV panel negative.  Blood cultures x2 from previous admission 01/14/2018 neg. ID consultation and follow-up appreciated.  I discussed in detail with Dr. Linus Salmons.  Patient was on cefepime which has now been narrowed to ceftriaxone.  LDH 301, ESR 137.  Urine pneumococcal antigen negative.  Sepsis: Present on admission has evidenced by tachycardia, tachypnea, hypoxia, mild leukocytosis and pneumonia.  Improved.  Acute on chronic hypoxic respiratory failure: Multifactorial.  Now due  to pneumonia complicating underlying COPD, possible OHS/OSA.  Treat pneumonia as above.  Bronchospasm has improved or resolved.  Reduce IV Solu-Medrol.  Continue bronchodilator nebulizations.  Add flutter valve.  ABG on admission reviewed: pH 7.4, PCO2 49, PO2 85, bicarbonate 30 and oxygen saturation 96% on oxygen.  HIV/AIDS: Last CD4 60 and 40 in the setting of acute illness and steroids.  As per ID previously was up to 430 and viral load has remained suppressed consistent with transient effect and not checking CD4 again.  ID doubts PJP ammonia.  Patient taking dapsone and Biktarvy.  Checking PJP DFA to be sure.  COPD: No clinical bronchospasm.  Reduce IV Solu-Medrol.  Continue preadmission Dulera and Spiriva.  Bronchodilator nebulizations.  Type II DM/IDDM: A1c 5.6 on 4/24.  Currently uncontrolled possibly from steroids.  Continue current dose of Levemir and SSI but may need adjustment.  Essential hypertension: Controlled.  Continue Imdur, Cardizem and hydralazine.  Chronic diastolic CHF: Lasix had been held on admission.  Appears mildly volume overloaded.  Resume Lasix and monitor strict intake and output.  TTE 2/25: LVEF 60-65%.  Paroxysmal A. fib: Currently in sinus rhythm.  Continue amiodarone, diltiazem and Eliquis.  Normocytic anemia: Suspect anemia of chronic disease.  Stable.  Leukocytosis: Likely secondary to infectious etiology and steroids.  Follow CBCs.  Physical deconditioning: PT and OT consultation.  Suspect return to SNF for rehab.  Other CT chest findings:?  Subcentimeter right mainstem bronchus debris versus nodule and trace paratracheal pneumomediastinum versus motion artifact?  Significance.  Clinically improving.  Monitor.?  Need for repeat CT in 4 weeks.   DVT prophylaxis: Eliquis Code Status: Full Family Communication: None at bedside Disposition: DC likely  to SNF pending clinical improvement.  Will consult PT and OT.   Consultants:  Infectious  disease  Procedures:  None  Antimicrobials:  IV cefepime-discontinued. IV ceftriaxone 6/14 > Dapsone.   Subjective: Patient states that he feels much better with improvement in his dyspnea and is anxious to be discharged.  Denies cough, swallowing difficulties or chest pain.  No other complaints reported.  Indicates that he uses oxygen only as needed.  As per RN, no acute issues noted.  ROS: As above  Objective:  Vitals:   01/22/18 0740 01/22/18 0817 01/22/18 0826 01/22/18 0827  BP: 132/68     Pulse: 100 99    Resp: 19 20    Temp: 97.6 F (36.4 C)     TempSrc: Oral     SpO2: 90% 90% 90% 90%  Weight:      Height:        Examination:  General exam: Pleasant young male, moderately built and obese, lying comfortably propped up in bed. Respiratory system: Slightly harsh breath sounds bilaterally.  Occasional bilateral crackles but no wheezing or rhonchi.  Minimal nasal flaring but otherwise no increased work of breathing. Cardiovascular system: S1 & S2 heard, RRR. No JVD, murmurs, rubs, gallops or clicks.  Trace bilateral ankle edema.  Telemetry personally reviewed and shows SR-occasional ST in the 100s. Gastrointestinal system: Abdomen is nondistended, soft and nontender. No organomegaly or masses felt. Normal bowel sounds heard. Central nervous system: Alert and oriented. No focal neurological deficits. Extremities: Symmetric 5 x 5 power. Skin: No rashes, lesions or ulcers Psychiatry: Judgement and insight appear impaired. Mood & affect appropriate.     Data Reviewed: I have personally reviewed following labs and imaging studies  CBC: Recent Labs  Lab 01/16/18 0916 01/18/18 0223 01/21/18 1240  WBC  --  18.1* 22.8*  NEUTROABS  --   --  18.5*  HGB 9.0* 8.5* 8.8*  HCT 29.9* 28.9* 30.7*  MCV  --  93.8 96.2  PLT  --  276 859   Basic Metabolic Panel: Recent Labs  Lab 01/18/18 0223 01/21/18 1240  NA 134* 139  K 3.8 4.0  CL 93* 95*  CO2 31 31  GLUCOSE 161*  130*  BUN 14 16  CREATININE 1.19 1.17  CALCIUM 9.2 9.3   Liver Function Tests: Recent Labs  Lab 01/21/18 1240  AST 32  ALT 34  ALKPHOS 59  BILITOT 0.4  PROT 7.6  ALBUMIN 2.5*    CBG: Recent Labs  Lab 01/21/18 1824 01/21/18 1827 01/21/18 2141 01/22/18 0742 01/22/18 1133  GLUCAP 342* 357* 274* 179* 164*    Recent Results (from the past 240 hour(s))  Culture, blood (Routine X 2) w Reflex to ID Panel     Status: None   Collection Time: 01/14/18  6:10 PM  Result Value Ref Range Status   Specimen Description BLOOD LEFT HAND  Final   Special Requests   Final    BOTTLES DRAWN AEROBIC ONLY Blood Culture results may not be optimal due to an inadequate volume of blood received in culture bottles   Culture   Final    NO GROWTH 5 DAYS Performed at [facility], [place] [address] [place], [place] [address]    Report Status 01/19/2018 FINAL  Final  Culture, blood (Routine X 2) w Reflex to ID Panel     Status: None   Collection Time: 01/14/18  6:14 PM  Result Value Ref Range Status   Specimen Description BLOOD RIGHT HAND  Final   Special Requests   Final    BOTTLES DRAWN AEROBIC ONLY Blood Culture results may not be optimal due to an inadequate volume of blood received in culture bottles   Culture   Final    NO GROWTH 5 DAYS Performed at [facility], [place] [address] [place], [place] [address]    Report Status 01/19/2018 FINAL  Final  Respiratory Panel by PCR     Status: None   Collection Time: 01/21/18  6:30 PM  Result Value Ref Range Status   Adenovirus NOT DETECTED NOT DETECTED Final   Coronavirus 229E NOT DETECTED NOT DETECTED Final   Coronavirus HKU1 NOT DETECTED NOT DETECTED Final   Coronavirus NL63 NOT DETECTED NOT DETECTED Final   Coronavirus OC43 NOT DETECTED NOT DETECTED Final   Metapneumovirus NOT DETECTED NOT DETECTED Final   Rhinovirus / Enterovirus NOT DETECTED NOT DETECTED Final   Influenza A NOT DETECTED NOT DETECTED Final   Influenza B NOT  DETECTED NOT DETECTED Final   Parainfluenza Virus 1 NOT DETECTED NOT DETECTED Final   Parainfluenza Virus 2 NOT DETECTED NOT DETECTED Final   Parainfluenza Virus 3 NOT DETECTED NOT DETECTED Final   Parainfluenza Virus 4 NOT DETECTED NOT DETECTED Final   Respiratory Syncytial Virus NOT DETECTED NOT DETECTED Final   Bordetella pertussis NOT DETECTED NOT DETECTED Final   Chlamydophila pneumoniae NOT DETECTED NOT DETECTED Final   Mycoplasma pneumoniae NOT DETECTED NOT DETECTED Final    Comment: Performed at [facility], [place] [address] [place], [place] [address]         Radiology Studies: Ct Chest Wo Contrast  Result Date: 01/21/2018 CLINICAL DATA:  Chronic cough. Follow up pneumonia. History of HIV with low CD4, diabetes, COPD, CHF. EXAM: CT CHEST WITHOUT CONTRAST TECHNIQUE: Multidetector CT imaging of the chest was performed following the standard protocol without IV contrast. COMPARISON:  Chest radiograph January 21, 2018 and CT chest July 23, 2017 FINDINGS: Mild respiratory motion degraded examination. CARDIOVASCULAR: Heart and pericardium are unremarkable. Ascending aorta remains top-normal in size. Mild calcific atherosclerosis. Heart is mildly enlarged. No pericardial effusion. Trace coronary artery calcifications. MEDIASTINUM/NODES: No mediastinal mass. No lymphadenopathy by CT size criteria though sensitivity decreased without intravenous contrast. Trace paratracheal pneumomediastinum versus motion artifact. LUNGS/PLEURA: Tracheobronchial tree is patent, no pneumothorax. Subcentimeter focal soft tissue RIGHT posterior main stem bronchus. Moderate centrilobular emphysema. Multifocal consolidation bilateral lower lobes and to lesser extent LEFT upper lobe with air bronchograms. Neck ground-glass nodules lingula, tree-in-bud infiltrates RIGHT middle lobe. 5 mm subsolid RIGHT middle lobe stable pulmonary nodule. Increased lung volumes with flattened hemidiaphragms. Pleural effusion. UPPER  ABDOMEN: Nonacute. MUSCULOSKELETAL: Nonacute.  Mild gynecomastia. IMPRESSION: 1. Multifocal pneumonia and bronchiolitis. Subcentimeter RIGHT mainstem bronchus debris versus nodule. 2. Trace paratracheal pneumomediastinum versus motion artifact. 3. Mild cardiomegaly. Aortic Atherosclerosis (ICD10-I70.0) and Emphysema (ICD10-[name]). Electronically Signed   By: Elon Alas M.D.   On: 01/21/2018 17:45   Dg Chest Port 1 View  Result Date: 01/21/2018 CLINICAL DATA:  Shortness of breath EXAM: PORTABLE CHEST 1 VIEW COMPARISON:  01/18/2018 FINDINGS: Cardiac shadow is mildly enlarged but stable. Increased patchy infiltrate is noted in the left base with some right basilar atelectasis noted. No sizable effusion is seen. IMPRESSION: New left basilar pneumonia with right basilar atelectasis. Electronically Signed   By: Inez Catalina M.D.   On: 01/21/2018 12:23        Scheduled Meds: . amiodarone  200 mg Oral Daily  . amitriptyline  100  mg Oral QHS  . apixaban  5 mg Oral BID  . bictegravir-emtricitabine-tenofovir AF  1 tablet Oral Q1500  . dapsone  100 mg Oral Q breakfast  . diltiazem  240 mg Oral Daily  . gabapentin  300 mg Oral TID  . guaiFENesin  1,200 mg Oral BID  . hydrALAZINE  50 mg Oral Q8H  . insulin aspart  0-15 Units Subcutaneous TID WC  . insulin aspart  0-5 Units Subcutaneous QHS  . insulin detemir  30 Units Subcutaneous Q2200  . isosorbide mononitrate  30 mg Oral Daily  . levalbuterol  0.63 mg Nebulization BID  . methylPREDNISolone (SOLU-MEDROL) injection  60 mg Intravenous Q12H  . mometasone-formoterol  2 puff Inhalation BID  . tiotropium  18 mcg Inhalation Daily   Continuous Infusions: . cefTRIAXone (ROCEPHIN)  IV       LOS: 1 day     Vernell Leep, MD, FACP, [facility]. [facility] Pager [phone]  If 7PM-7AM, please contact night-coverage [email or web address] Password [email or web address] 01/22/2018, 2:05 PM

## 2018-01-22 NOTE — Progress Notes (Addendum)
2000 Bedside shift report. Pt getting back with help of nurse aid. Pt sitting up in recliner. Denies pain, SOB. No complaints. WCTM.  2030 Pt assessed see flow sheet. Updated with POC. Pt resting comfortably in bed, no needs at this time. Fall precautions in place, [facility]   2145 Pt sleeping, easy to arouse, medicated per MAR. No needs at this time, WCTM.   2345 Pt still sleeping comfortably, fall precautions in place, WCTM.

## 2018-01-22 NOTE — Evaluation (Signed)
Physical Therapy Evaluation Patient Details Name: Chris [name]. MRN: [id number] DOB: 03/30/1966 Today's Date: 01/22/2018   History of Present Illness  52 year old male with PMH of HIV/AIDS, COPD, chronic hypoxic respiratory failure?  PRN oxygen, HTN, chronic diastolic CHF, DM 2, A. fib on Eliquis, tobacco abuse, recent influenza induced ARDS requiring tracheostomy intubation, recent frequent hospitalizations for hypoxia due to COPD flare versus pneumonia, most recent hospitalization 01/14/2018-01/19/2018 for acute on chronic hypoxic respiratory failure secondary to HCAP, completed 6 days of IV antibiotics and discharged to [facility], presented to the ED on 01/21/2018 due to progressive dyspnea, hypoxia with oxygen saturations in the 70s requiring 5 L/min oxygen. CT chest without contrast 01/21/2018 showed multifocal pneumonia and bronchiolitis  Clinical Impression  PTA pt in rehab at [facility] and progressing towards walking goals. Pt currently limited in safe mobility by oxygen desaturation (see General Comments) and decreased strength and endurance. Pt currently supervision for bed mobility, min A for sit>stand with RW and minA for ambulation of 20 feet with RW with 1x modA for LoB. PT recommends return to [facility] at d/c to finish therapy. PT will continue to follow acutely.    Follow Up Recommendations SNF;Supervision/Assistance - 24 hour    Equipment Recommendations  None recommended by PT    Recommendations for Other Services       Precautions / Restrictions Precautions Precaution Comments: knees buckle unexpectedly (R>L) Restrictions Weight Bearing Restrictions: No      Mobility  Bed Mobility Overal bed mobility: Needs Assistance Bed Mobility: Supine to Sit     Supine to sit: Supervision     General bed mobility comments: use of momentum and bedrail to come EoB  Transfers Overall transfer level: Needs assistance Equipment used: Rolling walker (2  wheeled) Transfers: Sit to/from Stand Sit to Stand: Min guard            Ambulation/Gait Ambulation/Gait assistance: Min assist;Mod assist Gait Distance (Feet): 30 Feet(1x10 feet, 1x 20 feet) Assistive device: Rolling walker (2 wheeled) Gait Pattern/deviations: Step-through pattern;Decreased stride length;Shuffle;Trunk flexed Gait velocity: slowed Gait velocity interpretation: <1.8 ft/sec, indicate of risk for recurrent falls General Gait Details: minA for steadying with RW, 1x LoB requiring modA to balance, vc for proximity to RW and upright posture      Balance Overall balance assessment: Needs assistance Sitting-balance support: No upper extremity supported;Feet supported Sitting balance-Leahy Scale: Good     Standing balance support: No upper extremity supported Standing balance-Leahy Scale: Fair Standing balance comment: stands for 20 sec without assist                              Pertinent Vitals/Pain Pain Assessment: No/denies pain    Home Living Family/patient expects to be discharged to:: Skilled nursing facility                      Prior Function Level of Independence: Needs assistance   Gait / Transfers Assistance Needed: at SNF walking with +2 assist (second person close follow with w/c due to knees unexpectedly give out)  ADL's / Homemaking Assistance Needed: assistance from staff at rehab        Hand Dominance   Dominant Hand: Right    Extremity/Trunk Assessment   Upper Extremity Assessment Upper Extremity Assessment: Generalized weakness    Lower Extremity Assessment RLE Deficits / Details: AROM WFL, hip flex 3+/5, knee ext/flex 3+/5, ankle dorsiflex 4/5 LLE  Deficits / Details: AROM WFL, hip flex 3+/5, knee ext/flex 3+/5, ankle dorsiflex 4/5       Communication   Communication: Expressive difficulties  Cognition Arousal/Alertness: Awake/alert Behavior During Therapy: Impulsive Overall Cognitive Status: No  family/caregiver present to determine baseline cognitive functioning Area of Impairment: Safety/judgement;Awareness;Problem solving                       Following Commands: Follows multi-step commands inconsistently;Follows one step commands consistently Safety/Judgement: Decreased awareness of safety;Decreased awareness of deficits Awareness: Emergent Problem Solving: Requires verbal cues;Requires tactile cues        General Comments General comments (skin integrity, edema, etc.): at rest on 3.5L O2 via nasal cannula, SaO2 93%O2, HR 90bpm, with ambulation of 20 feet SaO2 dropped to 81%O2 and HR 125 bpm, with resting SaO2 returned to 91%O2 and HR slowly recovering to 110bpm        Assessment/Plan    PT Assessment Patient needs continued PT services  PT Problem List Decreased strength;Decreased activity tolerance;Decreased balance;Decreased mobility;Cardiopulmonary status limiting activity       PT Treatment Interventions DME instruction;Gait training;Functional mobility training;Therapeutic activities;Therapeutic exercise;Balance training;Patient/family education    PT Goals (Current goals can be found in the Care Plan section)  Acute Rehab PT Goals Patient Stated Goal: return to [facility] where he can get therapy 5days/week PT Goal Formulation: With patient Time For Goal Achievement: 02/05/18 Potential to Achieve Goals: Fair    Frequency Min 2X/week    AM-PAC PT "6 Clicks" Daily Activity  Outcome Measure Difficulty turning over in bed (including adjusting bedclothes, sheets and blankets)?: A Little Difficulty moving from lying on back to sitting on the side of the bed? : A Little Difficulty sitting down on and standing up from a chair with arms (e.g., wheelchair, bedside commode, etc,.)?: Unable Help needed moving to and from a bed to chair (including a wheelchair)?: A Little Help needed walking in hospital room?: A Little Help needed climbing 3-5 steps with a  railing? : A Lot 6 Click Score: 15    End of Session Equipment Utilized During Treatment: Gait belt;Oxygen Activity Tolerance: Patient tolerated treatment well Patient left: in chair;with call bell/phone within reach;with chair alarm set Nurse Communication: Mobility status PT Visit Diagnosis: Unsteadiness on feet (R26.81);Other abnormalities of gait and mobility (R26.89);Muscle weakness (generalized) (M62.81);Difficulty in walking, not elsewhere classified (R26.2)    Time: 1515-1550 PT Time Calculation (min) (ACUTE ONLY): 35 min   Charges:   PT Evaluation $PT Eval Moderate Complexity: 1 Mod PT Treatments $Gait Training: 8-22 mins   PT G Codes:        Malan Werk B. Beverely RisenVan Fleet PT, DPT Acute Rehabilitation  [phone] Pager [phone]    Elon Alaslizabeth B Van Fleet 01/22/2018, 4:14 PM

## 2018-01-22 NOTE — Progress Notes (Signed)
[facility]   Reason for visit: Follow up on sob  Interval History: CT with multifocal findings with bronchiolitis; WBC up to 22.8 on steroids, afebrile.  Wants to go back to his facility.    CT independently reviewed and bilateral findings, mainly upper lobes  Physical Exam: Constitutional:  Vitals:   01/22/18 0826 01/22/18 0827  BP:    Pulse:    Resp:    Temp:    SpO2: 90% 90%   patient appears in NAD Eyes: anicteric HENT: no thrush Respiratory: Normal respiratory effort; CTA B Cardiovascular: RRR GI: soft, nt, nd, morbidly obese  Review of Systems: Constitutional: negative for fevers and chills Respiratory: negative for cough or sputum Gastrointestinal: negative for diarrhea  Lab Results  Component Value Date   WBC 22.8 (H) 01/21/2018   HGB 8.8 (L) 01/21/2018   HCT 30.7 (L) 01/21/2018   MCV 96.2 01/21/2018   PLT 309 01/21/2018    Lab Results  Component Value Date   CREATININE 1.17 01/21/2018   BUN 16 01/21/2018   NA 139 01/21/2018   K 4.0 01/21/2018   CL 95 (L) 01/21/2018   CO2 31 01/21/2018    Lab Results  Component Value Date   ALT 34 01/21/2018   AST 32 01/21/2018   ALKPHOS 59 01/21/2018     Microbiology: Recent Results (from the past 240 hour(s))  Culture, blood (Routine X 2) w Reflex to ID Panel     Status: None   Collection Time: 01/14/18  6:10 PM  Result Value Ref Range Status   Specimen Description BLOOD LEFT HAND  Final   Special Requests   Final    BOTTLES DRAWN AEROBIC ONLY Blood Culture results may not be optimal due to an inadequate volume of blood received in culture bottles   Culture   Final    NO GROWTH 5 DAYS Performed at [facility], [address], [place], [place] [address]    Report Status 01/19/2018 FINAL  Final  Culture, blood (Routine X 2) w Reflex to ID Panel     Status: None   Collection Time: 01/14/18  6:14 PM  Result Value Ref Range Status   Specimen Description BLOOD RIGHT HAND   Final   Special Requests   Final    BOTTLES DRAWN AEROBIC ONLY Blood Culture results may not be optimal due to an inadequate volume of blood received in culture bottles   Culture   Final    NO GROWTH 5 DAYS Performed at [facility], [address] [place], [place] [address]    Report Status 01/19/2018 FINAL  Final  Respiratory Panel by PCR     Status: None   Collection Time: 01/21/18  6:30 PM  Result Value Ref Range Status   Adenovirus NOT DETECTED NOT DETECTED Final   Coronavirus 229E NOT DETECTED NOT DETECTED Final   Coronavirus HKU1 NOT DETECTED NOT DETECTED Final   Coronavirus NL63 NOT DETECTED NOT DETECTED Final   Coronavirus OC43 NOT DETECTED NOT DETECTED Final   Metapneumovirus NOT DETECTED NOT DETECTED Final   Rhinovirus / Enterovirus NOT DETECTED NOT DETECTED Final   Influenza A NOT DETECTED NOT DETECTED Final   Influenza B NOT DETECTED NOT DETECTED Final   Parainfluenza Virus 1 NOT DETECTED NOT DETECTED Final   Parainfluenza Virus 2 NOT DETECTED NOT DETECTED Final   Parainfluenza Virus 3 NOT DETECTED NOT DETECTED Final   Parainfluenza Virus 4 NOT DETECTED NOT DETECTED Final  Respiratory Syncytial Virus NOT DETECTED NOT DETECTED Final   Bordetella pertussis NOT DETECTED NOT DETECTED Final   Chlamydophila pneumoniae NOT DETECTED NOT DETECTED Final   Mycoplasma pneumoniae NOT DETECTED NOT DETECTED Final    Comment: Performed at [facility], [address] [place], [place] [address]    Impression/Plan:  1. Pneumonia - likely viral with multifocal findings.  On cefepime and I will narrow to ceftriaxone for potential bacteria with negative RVP.    2.  Hypoxia - likely multifactorial. Consider evaluation after acute illness.  3.  HIV - last CD4 60 and 40 in the setting of acute illness, steroids.  Previously was up to 430 and viral load has remained suppressed c/w transient effect.  I will recheck the CD4 now.   I doubt PJP pneumonia with likely just transient  effect, taking dapsone in a supervised setting and compliant with his Biktarvy as well.  I though will check a PJP DFA to be sure.    Dr. Orvan Falconerampbell will follow up tomorrow.

## 2018-01-23 DIAGNOSIS — J9601 Acute respiratory failure with hypoxia: Secondary | ICD-10-CM

## 2018-01-23 LAB — BASIC METABOLIC PANEL
ANION GAP: 9 (ref 5–15)
BUN: 25 mg/dL — ABNORMAL HIGH (ref 6–20)
CALCIUM: 9 mg/dL (ref 8.9–10.3)
CO2: 27 mmol/L (ref 22–32)
CREATININE: 1.1 mg/dL (ref 0.61–1.24)
Chloride: 96 mmol/L — ABNORMAL LOW (ref 101–111)
GFR calc non Af Amer: >60 mL/min (ref >60)
Glucose, Bld: 340 mg/dL — ABNORMAL HIGH (ref 65–99)
Potassium: 4.5 mmol/L (ref 3.5–5.1)
SODIUM: 132 mmol/L — AB (ref 135–145)

## 2018-01-23 LAB — CBC
HCT: 27 % — ABNORMAL LOW (ref 39.0–52.0)
HEMOGLOBIN: 8 g/dL — AB (ref 13.0–17.0)
MCH: 27.5 pg (ref 26.0–34.0)
MCHC: 29.6 g/dL — AB (ref 30.0–36.0)
MCV: 92.8 fL (ref 78.0–100.0)
Platelets: 348 10*3/uL (ref 150–400)
RBC: 2.91 MIL/uL — ABNORMAL LOW (ref 4.22–5.81)
RDW: 13.7 % (ref 11.5–15.5)
WBC: 33.2 10*3/uL — AB (ref 4.0–10.5)

## 2018-01-23 LAB — GLUCOSE, CAPILLARY
GLUCOSE-CAPILLARY: 228 mg/dL — AB (ref 65–99)
GLUCOSE-CAPILLARY: 214 mg/dL — AB (ref 65–99)

## 2018-01-23 LAB — HIV-1 RNA QUANT-NO REFLEX-BLD: LOG10 HIV-1 RNA: UNDETERMINED {Log_copies}/mL

## 2018-01-23 NOTE — Progress Notes (Signed)
PROGRESS NOTE   Chris [name].  SWF:[id number]    DOB: August 16, 1965    DOA: 01/21/2018  PCP: [name], NP   I have briefly reviewed patients previous medical records in [facility].  Brief Summary:  52 year old male with PMH of HIV/AIDS, COPD, chronic hypoxic respiratory failure?  PRN oxygen, HTN, chronic diastolic CHF, DM 2, A. fib on Eliquis, tobacco abuse, recent influenza induced ARDS requiring tracheostomy intubation, recent frequent hospitalizations for hypoxia due to COPD flare versus pneumonia, most recent hospitalization 01/14/2018-01/19/2018 for acute on chronic hypoxic respiratory failure secondary to HCAP, completed 6 days of IV antibiotics and discharged to [facility], presented to the ED on 01/21/2018 due to progressive dyspnea, hypoxia with oxygen saturations in the 70s requiring 5 L/min oxygen.  Admitted again for suspected HCAP.  Infectious disease consulting.  01/23/2018: Patient seen.  Worsening leukocytosis noted.  Need to stay in the hospital to complete work-up and management discussed with the patient extensively, and the patient voiced understanding.  Patient is awake, alert and oriented to time, place and person.  Patient has capacity to receive and process information.  Essentially, patient has capacity to make medical decision.   Assessment & Plan:   Principal Problem:   Acute and chronic respiratory failure with hypoxia  Active Problems:   Left lower lobe pneumonia/HCAP   Human immunodeficiency virus (HIV) disease /CD4 60 May 2019   COPD (chronic obstructive pulmonary disease) (HCC)   Diabetes mellitus type 2, insulin dependent (HCC)   Hypertension/chronic diastolic heart failure   Paroxysmal atrial fibrillation (HCC)   Physical deconditioning   Sepsis ([place])   HCAP: CT chest without contrast 01/21/2018 showed multifocal pneumonia and bronchiolitis.  RSV panel negative.  Blood cultures x2 from previous admission 01/14/2018 neg. ID consultation and  follow-up appreciated.  I discussed in detail with Dr. Linus Salmons.  Patient was on cefepime which has now been narrowed to ceftriaxone.  LDH 301, ESR 137.  Urine pneumococcal antigen negative. -Continue current management.  Follow CBC.  Sepsis: Present on admission has evidenced by tachycardia, tachypnea, hypoxia, mild leukocytosis and pneumonia.  Improved.  Acute on chronic hypoxic respiratory failure: Multifactorial.  Now due to pneumonia complicating underlying COPD, possible OHS/OSA.  Treat pneumonia as above.  Bronchospasm has improved or resolved.  Reduce IV Solu-Medrol.  Continue bronchodilator nebulizations.  Add flutter valve.  ABG on admission reviewed: pH 7.4, PCO2 49, PO2 85, bicarbonate 30 and oxygen saturation 96% on oxygen.  HIV/AIDS: Last CD4 60 and 40 in the setting of acute illness and steroids.  As per ID previously was up to 430 and viral load has remained suppressed consistent with transient effect and not checking CD4 again.  ID doubts PJP ammonia.  Patient taking dapsone and Biktarvy.  Checking PJP DFA to be sure.  COPD: No clinical bronchospasm.  Reduce IV Solu-Medrol.  Continue preadmission Dulera and Spiriva.  Bronchodilator nebulizations.  Type II DM/IDDM: A1c 5.6 on 4/24.  Currently uncontrolled possibly from steroids.  Continue current dose of Levemir and SSI but may need adjustment.  Essential hypertension: Controlled.  Continue Imdur, Cardizem and hydralazine.  Chronic diastolic CHF: Lasix had been held on admission.  Appears mildly volume overloaded.  Resume Lasix and monitor strict intake and output.  TTE 2/25: LVEF 60-65%.  Paroxysmal A. fib: Currently in sinus rhythm.  Continue amiodarone, diltiazem and Eliquis.  Normocytic anemia: Suspect anemia of chronic disease.  Stable.  Leukocytosis: Likely secondary to infectious etiology and steroids.  Follow  CBCs.  Physical deconditioning: PT and OT consultation.  Suspect return to SNF for rehab.  Other CT chest  findings:?  Subcentimeter right mainstem bronchus debris versus nodule and trace paratracheal pneumomediastinum versus motion artifact?  Significance.  Clinically improving.  Monitor.?  Need for repeat CT in 4 weeks.   DVT prophylaxis: Eliquis Code Status: Full Family Communication: None at bedside Disposition: DC likely to SNF pending clinical improvement.  Will consult PT and OT.   Consultants:  Infectious disease  Procedures:  None  Antimicrobials:  IV cefepime-discontinued. IV ceftriaxone 6/14 > Dapsone.   Subjective: Patient seen.  Worsening leukocytosis noted.  Patient is on 4 L of supplemental oxygen.  Patient feels that he can go down to 2 L of supplemental oxygen.  Worsening leukocytosis communicated to the patient.  However, the patient does not look septic or severely ill today.  Will optimize steroid therapy.     ROS: As above  Objective:  Vitals:   01/23/18 0412 01/23/18 0600 01/23/18 0737 01/23/18 0835  BP:   (!) 153/81   Pulse:   (!) 102   Resp:   19   Temp:   98.5 F (36.9 C)   TempSrc:   Oral   SpO2:   93% 95%  Weight: 106.6 kg (235 lb 0.2 oz) 106.3 kg (234 lb 5.6 oz)    Height:  _0  (1.753 m)      Examination:  General exam: Pleasant young male, moderately built and obese, lying comfortably propped up in bed. Respiratory system: Decreased air entry.  Mild expiratory wheeze.   Cardiovascular system: S1 & S2 heard, RRR. No JVD, murmurs, rubs, gallops or clicks.  Trace bilateral ankle edema.  Telemetry personally reviewed and shows SR-occasional ST in the 100s. Gastrointestinal system: Abdomen is nondistended, soft and nontender. No organomegaly or masses felt. Normal bowel sounds heard. Central nervous system: Alert and oriented. No focal neurological deficits. Extremities: Symmetric 5 x 5 power. Skin: No rashes, lesions or ulcers Psychiatry: Judgement and insight appear impaired. Mood & affect appropriate.     Data Reviewed: I have personally  reviewed following labs and imaging studies  CBC: Recent Labs  Lab 01/18/18 0223 01/21/18 1240 01/23/18 0413  WBC 18.1* 22.8* 33.2*  NEUTROABS  --  18.5*  --   HGB 8.5* 8.8* 8.0*  HCT 28.9* 30.7* 27.0*  MCV 93.8 96.2 92.8  PLT 276 309 827   Basic Metabolic Panel: Recent Labs  Lab 01/18/18 0223 01/21/18 1240 01/23/18 0413  NA 134* 139 132*  K 3.8 4.0 4.5  CL 93* 95* 96*  CO2 _1 GLUCOSE 161* 130* 340*  BUN 14 16 25*  CREATININE 1.19 1.17 1.10  CALCIUM 9.2 9.3 9.0   Liver Function Tests: Recent Labs  Lab 01/21/18 1240  AST 32  ALT 34  ALKPHOS 59  BILITOT 0.4  PROT 7.6  ALBUMIN 2.5*    CBG: Recent Labs  Lab 01/22/18 0742 01/22/18 1133 01/22/18 1622 01/22/18 2107 01/23/18 0738  GLUCAP 179* 164* 349* 218* 228*    Recent Results (from the past 240 hour(s))  Culture, blood (Routine X 2) w Reflex to ID Panel     Status: None   Collection Time: 01/14/18  6:10 PM  Result Value Ref Range Status   Specimen Description BLOOD LEFT HAND  Final   Special Requests   Final    BOTTLES DRAWN AEROBIC ONLY Blood Culture results may not be optimal due to an inadequate volume of blood  received in culture bottles   Culture   Final    NO GROWTH 5 DAYS Performed at [facility], [place] [address], [place], [place] [address]    Report Status 01/19/2018 FINAL  Final  Culture, blood (Routine X 2) w Reflex to ID Panel     Status: None   Collection Time: 01/14/18  6:14 PM  Result Value Ref Range Status   Specimen Description BLOOD RIGHT HAND  Final   Special Requests   Final    BOTTLES DRAWN AEROBIC ONLY Blood Culture results may not be optimal due to an inadequate volume of blood received in culture bottles   Culture   Final    NO GROWTH 5 DAYS Performed at [facility], [place] [address] [place], [place] [address]    Report Status 01/19/2018 FINAL  Final  Blood Culture (routine x 2)     Status: None (Preliminary result)   Collection Time: 01/21/18 12:40 PM    Result Value Ref Range Status   Specimen Description BLOOD SITE NOT SPECIFIED  Final   Special Requests   Final    BOTTLES DRAWN AEROBIC AND ANAEROBIC Blood Culture adequate volume   Culture   Final    NO GROWTH 1 DAY Performed at [facility], [place] [address], [place], [place] [address]    Report Status PENDING  Incomplete  Blood Culture (routine x 2)     Status: None (Preliminary result)   Collection Time: 01/21/18  6:18 PM  Result Value Ref Range Status   Specimen Description BLOOD RIGHT ARM  Final   Special Requests   Final    BOTTLES DRAWN AEROBIC ONLY Blood Culture results may not be optimal due to an inadequate volume of blood received in culture bottles   Culture   Final    NO GROWTH < 24 HOURS Performed at [facility], [place] [address] [place], [place] [address]    Report Status PENDING  Incomplete  Respiratory Panel by PCR     Status: None   Collection Time: 01/21/18  6:30 PM  Result Value Ref Range Status   Adenovirus NOT DETECTED NOT DETECTED Final   Coronavirus 229E NOT DETECTED NOT DETECTED Final   Coronavirus HKU1 NOT DETECTED NOT DETECTED Final   Coronavirus NL63 NOT DETECTED NOT DETECTED Final   Coronavirus OC43 NOT DETECTED NOT DETECTED Final   Metapneumovirus NOT DETECTED NOT DETECTED Final   Rhinovirus / Enterovirus NOT DETECTED NOT DETECTED Final   Influenza A NOT DETECTED NOT DETECTED Final   Influenza B NOT DETECTED NOT DETECTED Final   Parainfluenza Virus 1 NOT DETECTED NOT DETECTED Final   Parainfluenza Virus 2 NOT DETECTED NOT DETECTED Final   Parainfluenza Virus 3 NOT DETECTED NOT DETECTED Final   Parainfluenza Virus 4 NOT DETECTED NOT DETECTED Final   Respiratory Syncytial Virus NOT DETECTED NOT DETECTED Final   Bordetella pertussis NOT DETECTED NOT DETECTED Final   Chlamydophila pneumoniae NOT DETECTED NOT DETECTED Final   Mycoplasma pneumoniae NOT DETECTED NOT DETECTED Final    Comment: Performed at [facility], [place]  [address] [place], [place] [address]         Radiology Studies: Ct Chest Wo Contrast  Result Date: 01/21/2018 CLINICAL DATA:  Chronic cough. Follow up pneumonia. History of HIV with low CD4, diabetes, COPD, CHF. EXAM: CT CHEST WITHOUT CONTRAST TECHNIQUE: Multidetector CT imaging of the chest was performed following the standard protocol without IV contrast. COMPARISON:  Chest radiograph January 21, 2018 and CT chest July 23, 2017 FINDINGS: Mild respiratory motion degraded examination. CARDIOVASCULAR: Heart and pericardium are unremarkable. Ascending aorta remains top-normal in size. Mild calcific atherosclerosis. Heart is mildly enlarged. No pericardial effusion. Trace coronary artery calcifications. MEDIASTINUM/NODES: No mediastinal mass. No lymphadenopathy by CT size criteria though sensitivity decreased without intravenous contrast. Trace paratracheal pneumomediastinum versus motion artifact. LUNGS/PLEURA: Tracheobronchial tree is patent, no pneumothorax. Subcentimeter focal soft tissue RIGHT posterior main stem bronchus. Moderate centrilobular emphysema. Multifocal consolidation bilateral lower lobes and to lesser extent LEFT upper lobe with air bronchograms. Neck ground-glass nodules lingula, tree-in-bud infiltrates RIGHT middle lobe. 5 mm subsolid RIGHT middle lobe stable pulmonary nodule. Increased lung volumes with flattened hemidiaphragms. Pleural effusion. UPPER ABDOMEN: Nonacute. MUSCULOSKELETAL: Nonacute.  Mild gynecomastia. IMPRESSION: 1. Multifocal pneumonia and bronchiolitis. Subcentimeter RIGHT mainstem bronchus debris versus nodule. 2. Trace paratracheal pneumomediastinum versus motion artifact. 3. Mild cardiomegaly. Aortic Atherosclerosis (ICD10-I70.0) and Emphysema (ICD10-[name]). Electronically Signed   By: Elon Alas M.D.   On: 01/21/2018 17:45   Dg Chest Port 1 View  Result Date: 01/21/2018 CLINICAL DATA:  Shortness of breath EXAM: PORTABLE CHEST 1 VIEW COMPARISON:  01/18/2018  FINDINGS: Cardiac shadow is mildly enlarged but stable. Increased patchy infiltrate is noted in the left base with some right basilar atelectasis noted. No sizable effusion is seen. IMPRESSION: New left basilar pneumonia with right basilar atelectasis. Electronically Signed   By: Inez Catalina M.D.   On: 01/21/2018 12:23        Scheduled Meds: . amiodarone  200 mg Oral Daily  . amitriptyline  100 mg Oral QHS  . apixaban  5 mg Oral BID  . bictegravir-emtricitabine-tenofovir AF  1 tablet Oral Q1500  . dapsone  100 mg Oral Q breakfast  . diltiazem  240 mg Oral Daily  . furosemide  40 mg Oral BID  . gabapentin  300 mg Oral TID  . guaiFENesin  1,200 mg Oral BID  . hydrALAZINE  50 mg Oral Q8H  . insulin aspart  0-15 Units Subcutaneous TID WC  . insulin aspart  0-5 Units Subcutaneous QHS  . insulin detemir  30 Units Subcutaneous Q2200  . isosorbide mononitrate  30 mg Oral Daily  . methylPREDNISolone (SOLU-MEDROL) injection  40 mg Intravenous Q12H  . mometasone-formoterol  2 puff Inhalation BID  . potassium chloride SA  20 mEq Oral Daily  . tiotropium  18 mcg Inhalation Daily   Continuous Infusions: . cefTRIAXone (ROCEPHIN)  IV Stopped (01/22/18 2052)     LOS: 2 days     Bonnell Public, MD. [facility] Pager [phone].  If 7PM-7AM, please contact night-coverage [email or web address] Password [email or web address] 01/23/2018, 11:02 AM

## 2018-01-23 NOTE — Progress Notes (Addendum)
0215 Pt sleeping, NAD, fall precautions in place, WCTM.   0315 Pt awake, asking for giner ale and something for gas pain. Medicated per MAR. WCTM.,   0540 Pt sleeping, easy to arouse, medicated per MAR. No needs at this time.   [id number] Pt resting comfortably, fall precautions in place, awaiting day shift RN for report.

## 2018-01-23 NOTE — Progress Notes (Signed)
[id number] Pt called nurse tech to room and told nurse tech, "I am ready to leave, will you get my things together?" Nurse tech immediately alerted RN and RN went into pt's room with charge RN. Pt stated, "I feel better I am ready to leave and my wife is coming to pick me up." Charge RN and this RN provided education on why the pt was not being discharged yet and the benefits of continuing to stay. RN also spoke to pt's wife on the phone. RN paged Dr. Dartha Lodgegbata and notified him pt is wanting to leave AMA.

## 2018-01-23 NOTE — Discharge Summary (Signed)
Physician Discharge Summary  Patient ID: Chris [name] [name] MRN: [id number] DOB/AGE: January 21, 1966 52 y.o.  Admit date: 01/21/2018 Discharge date: 01/23/2018  Admission Diagnoses:  Discharge Diagnoses:  Principal Problem:   Acute and chronic respiratory failure with hypoxia  Active Problems:   Left lower lobe pneumonia/HCAP   Human immunodeficiency virus (HIV) disease /CD4 60 May 2019   COPD (chronic obstructive pulmonary disease) (HCC)   Diabetes mellitus type 2, insulin dependent (HCC)   Hypertension/chronic diastolic heart failure   Paroxysmal atrial fibrillation ([place])   Physical deconditioning   Sepsis [facility])   Discharged Condition: Patient signed himself out of the hospital [place].  Hospital Course: Patient is a 52 year old African-American male with past medical history significant for HIV/AIDS, COPD, chronic hypoxic respiratory failure, on home oxygen (2 L/min intubated nasal cannula), HTN, chronic diastolic CHF, DM 2, A. fib on Eliquis, tobacco abuse, recent influenza induced ARDS requiring tracheostomy intubation, recent frequent hospitalizations for hypoxia due to COPD flare versus pneumonia, most recent hospitalization 01/14/2018-01/19/2018 for acute on chronic hypoxic respiratory failure secondary to HCAP, completed 6 days of IV antibiotics and discharged to [facility], presented to the ED on 01/21/2018 due to progressive dyspnea, hypoxia with oxygen saturations in the 70s requiring 5 L/min oxygen.  Admitted again for suspected HCAP.  infectious disease was consulted to assist with patient's management.  Significant drop in patient's CD4 was noted, by the infectious disease team does not think it secondary to patient's HIV.  Worsening leukocytosis is noted.  Clinically, the patient does not look as bad as the lab work.  Patient signed himself out of the hospital [place] prior to completion of treatment.     Consults: ID  Discharge Exam: Blood  pressure (!) 153/81, pulse (!) 102, temperature 98.5 F (36.9 C), temperature source Oral, resp. rate 19, height '5\' 9"'  (1.753 m), weight 106.3 kg (234 lb 5.6 oz), SpO2 95 %.   Disposition: Patient signed himself out of the hospital [place]   Allergies as of 01/23/2018      Reactions   Bactrim [sulfamethoxazole-trimethoprim] Hives      Medication List    ASK your doctor about these medications   acetaminophen 500 MG tablet Commonly known as:  TYLENOL Take 1,000 mg by mouth every 6 (six) hours as needed for headache (pain).   albuterol 108 (90 Base) MCG/ACT inhaler Commonly known as:  PROVENTIL HFA;VENTOLIN HFA Inhale 2 puffs into the lungs every 6 (six) hours as needed for wheezing or shortness of breath.   amiodarone 200 MG tablet Commonly known as:  PACERONE Take 200 mg by mouth daily.   amitriptyline 100 MG tablet Commonly known as:  ELAVIL Take 1 tablet (100 mg total) by mouth at bedtime.   apixaban 5 MG Tabs tablet Commonly known as:  ELIQUIS Take 1 tablet (5 mg total) by mouth 2 (two) times daily for 15 days.   BIKTARVY 50-200-25 MG Tabs tablet Generic drug:  bictegravir-emtricitabine-tenofovir AF Take 1 tablet by mouth daily at 3 pm.   dapsone 100 MG tablet Take 1 tablet (100 mg total) by mouth daily with breakfast.   diltiazem 240 MG 24 hr capsule Commonly known as:  CARDIZEM CD Take 1 capsule (240 mg total) by mouth daily.   docusate sodium 100 MG capsule Commonly known as:  COLACE Take 100 mg by mouth daily as needed for mild constipation.   docusate 50 MG/5ML liquid Commonly known as:  COLACE Place 10 mLs (100  mg total) into feeding tube 2 (two) times daily as needed for mild constipation or moderate constipation.   furosemide 40 MG tablet Commonly known as:  LASIX Take 40 mg by mouth 2 (two) times daily.   gabapentin 300 MG capsule Commonly known as:  NEURONTIN Take 1 capsule (300 mg total) by mouth 3 (three) times daily.    glucose blood test strip Commonly known as:  ONETOUCH VERIO Use as instructed   guaiFENesin 600 MG 12 hr tablet Commonly known as:  MUCINEX Take 1,200 mg by mouth 2 (two) times daily.   hydrALAZINE 50 MG tablet Commonly known as:  APRESOLINE Take 1 tablet (50 mg total) by mouth every 8 (eight) hours.   Insulin Detemir 100 UNIT/ML Pen Commonly known as:  LEVEMIR Inject 30 Units into the skin daily at 10 pm.   Insulin Pen Needle 31G X 5 MM Misc Commonly known as:  B-D UF III MINI PEN NEEDLES Use as instructed   isosorbide mononitrate 30 MG 24 hr tablet Commonly known as:  IMDUR Take 1 tablet (30 mg total) by mouth daily.   metFORMIN 1000 MG tablet Commonly known as:  GLUCOPHAGE Take 1 tablet (1,000 mg total) by mouth daily with breakfast.   Misc. Devices Misc Please provide patient with a portable oximeter. Diagnosis: COPD GOLD III   mometasone-formoterol 200-5 MCG/ACT Aero Commonly known as:  DULERA Inhale 2 puffs into the lungs 2 (two) times daily.   ONETOUCH DELICA LANCETS 92J Misc Use as instructed   ONETOUCH VERIO w/Device Kit 1 kit by Does not apply route 2 (two) times daily.   oxyCODONE 5 MG immediate release tablet Commonly known as:  Oxy IR/ROXICODONE Take 1 tablet (5 mg total) by mouth 3 (three) times daily as needed for up to 15 doses for moderate pain or severe pain (pain).   OXYGEN Inhale 2 L into the lungs as needed (shortness of breath).   potassium chloride SA 20 MEQ tablet Commonly known as:  K-DUR,KLOR-CON Take 20 mEq by mouth daily.   senna 8.6 MG Tabs tablet Commonly known as:  SENOKOT Take 1 tablet by mouth 2 (two) times daily as needed for mild constipation.   SPIRIVA RESPIMAT 2.5 MCG/ACT Aers Generic drug:  Tiotropium Bromide Monohydrate Inhale 2 puffs into the lungs daily.        SignedBonnell Public 01/23/2018, 5:34 PM

## 2018-01-23 NOTE — Progress Notes (Signed)
Pt said, "I am leaving now" and started pulling off his telemetry leads and threw his gown on the floor. He then put his own clothes on and requested to sign AMA form. This RN called pt's wife and informed her he wanted to leave AMA and wife told RN she would not be picking pt up and that he could leave. Charge Nurse and RN educated pt on why he should remain hospitalized and pt still requested to leave. Pt called a taxi. Pt signed AMA form and took cell phone and clothing off the unit.

## 2018-01-25 LAB — T-HELPER CELLS (CD4) COUNT (NOT AT ARMC)
CD4 T CELL HELPER: 10 % — AB (ref 33–55)
CD4 T Cell Abs: 210 /uL — ABNORMAL LOW (ref 400–2700)

## 2018-01-26 ENCOUNTER — Other Ambulatory Visit: Payer: Self-pay | Admitting: Internal Medicine

## 2018-01-26 LAB — CULTURE, BLOOD (ROUTINE X 2)
CULTURE: NO GROWTH
Culture: NO GROWTH
Special Requests: ADEQUATE

## 2018-01-28 ENCOUNTER — Other Ambulatory Visit: Payer: Self-pay | Admitting: *Deleted

## 2018-01-28 ENCOUNTER — Encounter: Payer: Self-pay | Admitting: *Deleted

## 2018-01-28 NOTE — Patient Outreach (Signed)
[facility](THN) Care Management  01/28/2018  Chris FavorsJerome A Channell Jr. 07/09/1966 [id number]   CSW received a voicemail message from patient's wife, Chris [name] last evening requesting to cancel the initial home visit with patient, scheduled for today (Thursday, February 27, 2018) at 9:00AM.  CSW made an attempt to try and contact patient and Chris [name] to see about rescheduling the visit; however, neither were available at the time of CSW's call.  A HIPAA compliant message was left for patient on voicemail and CSW is currently awaiting a return call.  CSW will mail an Unsuccessful Outreach Letter to patient's home, then make a second outreach attempt again in 3-4 business days.  CSW noted that patient left the hospital AMA (Against Medical Advice) on Saturday, January 23, 2018, returning home to live with Chris [name] instead of [facility] patient [facility] residing to receive short-term rehabilitative services.  CSW has placed an order for patient to be assigned an RNCM to provide transition of care, as well as disease management services.  CSW will coordinate with the RNCM assigned to see about a joint home visit with patient and Chris [name] to address medical and psychosocial needs.  Chris [name], BSW, MSW, LCSW  Licensed Restaurant manager, fast foodClinical Social Worker  [facility] Care Management [facility]  Mailing [place], [place], [place] [address] Physical Address-[address], [place], [place] [address] Toll Free Main # [phone] Fax # [phone] Cell # [phone]  Office # [phone] [email or web address]

## 2018-01-29 ENCOUNTER — Other Ambulatory Visit: Payer: Self-pay | Admitting: *Deleted

## 2018-01-29 ENCOUNTER — Other Ambulatory Visit: Payer: Self-pay

## 2018-01-29 NOTE — Patient Outreach (Signed)
[facility]) Care Management  01/29/2018  Ruel FavorsJerome A Palomarez Jr. 04/01/1966 [id number]   CSW received a return call from patient's wife, Tina GriffithsKeisher Kirschenmann today, in response to the HIPAA compliant message left for Mrs. Lasala on voicemail by CSW yesterday.  CSW introduced self, explained role and types of services provided through [facility] Management [facility]N Care Management).  CSW further explained to Mrs. Tindol that CSW works with patient's RNCM, also with [facility] Care [facility], Kathyrn SheriffJuana Wallace. CSW then explained the reason for the call, indicating that CSW is concerned about patient's current medical condition, as CSW is aware that [facility] (Against Medical Advice).  CSW obtained two HIPAA compliant identifiers from Mrs. Kolk, which included patient's name and date of birth.  Mrs. Schleich admits that she is at her "wits-in" with patient, no longer able to tolerate his medical non-compliance.  Mrs. Mandler indicated that patient has already fallen several times since he has been back home, fortunately without injury.  Mrs. Werber reported that it is only a matter of time before patient will be re-admitted into the hospital.  Patient admitted to Mrs. Geisler that he should have never left the hospital AMA because now he realizes that he must be medically cleared by a physician before he would be able to return to a skilled nursing facility for rehabilitative services.  Not to mention, patient is still experiencing symptoms of Pneumonia, shortness of breath, weakness, fatigue, etc.  Mrs. Chastain wanted to know if CSW would be able to assist patient with getting back [facility] where patient was residing to receive short-term rehabilitative services.  CSW reminded Mrs. Marten that patient must be medically cleared before his insurance, or the skilled nursing facility, would be able to accept him back.  Mrs. Tolan was planning to  talk with patient about going into the hospital so that he can be [facility] to finish out his rehabilitative services.  Mrs. Feria has agreed to follow-up with CSW once she has had this conversation with patient, to report findings. CSW will await a return call.  CSW will make an outreach attempt to Mrs. Kral next week, if a return call is not received in the meantime.  CSW has contacted Mrs. Earlene PlaterWallace to provide her with an update on patient's status.  Mrs. Diekman admits being receptive to having Mrs. Wallace involved with patient's care.  Danford BadJoanna Ngina Royer, BSW, MSW, LCSW  Licensed Restaurant manager, fast foodClinical Social Worker  [facility] Care Management [place] [facility]  Mailing [place], [place], [place] [address] Physical Address-[address], [place], [place] [address] Toll Free Main # [phone] Fax # [phone] Cell # [phone]  Office # [phone] [email or web address]

## 2018-01-29 NOTE — Patient Outreach (Addendum)
[facility]N) Care Management  01/29/2018  Ruel FavorsJerome A Silbernagel Jr. 11/08/1965 [id number]   Referral received 12/28/17 per [facility] LCSW Danford BadJoanna Saporito. Client left hospital AMA.   52 year old with history of COPD,HTN, DM, HIV, Respiratory failure, Kidney Problem. Hospitlaized 6/13 with HCAP and left AMA on 6/15.  RNCM called to follow up. No answer and unable to leave message on home number. Client's wife answered the mobile number and took RNCM's name/contact to give to client.  Plan: send outreach letter and follow up within 3-4 business days.  Kathyrn SheriffJuana Kolden Dupee, RN, MSN, [facility],CCM [facility] Community Care Coordinator Cell: [phone]

## 2018-02-03 ENCOUNTER — Encounter: Payer: Self-pay | Admitting: *Deleted

## 2018-02-03 ENCOUNTER — Other Ambulatory Visit: Payer: Self-pay

## 2018-02-03 ENCOUNTER — Other Ambulatory Visit: Payer: Self-pay | Admitting: *Deleted

## 2018-02-03 NOTE — Patient Outreach (Signed)
[facility]) Care Management  02/03/2018  Ruel FavorsJerome A Heggie Jr. 12/04/1965 [id number]   CSW made an attempt to try and contact patient today to follow-up regarding social work services and resources; however, patient was unavailable at the time of CSW's call.  A HIPAA compliant message was left for patient on voicemail.  CSW is currently awaiting a return call.  CSW will make a second outreach attempt within the next 3-4 business days, if a return call is not received from patient in the meantime.  CSW will also mail an Outreach Letter to patient's home requesting that patient contact CSW if patient is interested in receiving social work services through CSW with [facility] Care Management. Danford BadJoanna Francisco Eyerly, BSW, MSW, LCSW  Licensed Restaurant manager, fast foodClinical Social Worker  [facility] Care Management [place] [facility]  Mailing [place], [place], [place] [address] Physical Address-[address], [place], [place] [address] Toll Free Main # [phone] Fax # [phone] Cell # [phone]  Office # [phone] [email or web address]

## 2018-02-03 NOTE — Progress Notes (Signed)
Thank you :)

## 2018-02-03 NOTE — Patient Outreach (Signed)
[facility]N) Care Management  02/03/2018  Ruel FavorsJerome A Kittrell Jr. 05/20/1966 [id number]   Referral received 12/28/17 per [facility] L[facility] Danford BadJoanna Saporito. Client left hospital AMA.   52 year old with history of COPD,HTN, DM, HIV, Respiratory failure, Kidney Problem. Hospitlaized 6/13 with HCAP and left AMA on 6/15.  RNCM called to follow up. No answer and unable to leave message.  Plan: follow up within 3-4 business days.  Kathyrn SheriffJuana Alcide Memoli, RN, MSN, [place] SpringsBSN,CCM [facility] Community Care Coordinator Cell: [phone]

## 2018-02-08 ENCOUNTER — Other Ambulatory Visit: Payer: Self-pay

## 2018-02-08 NOTE — Patient Outreach (Addendum)
[facility]) Care Management  02/08/2018  Chris FavorsJerome A Depuy Jr. 07/13/1966 [id number]   Referral received 12/28/17 [facility] Danford BadJoanna Saporito. Client left hospital AMA.  52 year old with history of COPD,HTN, DM, HIV, Respiratory failure, Kidney Problem.Hospitlaized 6/13 with HCAP and left AMA on 6/15.  RNCM called to follow up. No answer. This is the 3rd outreach call attempt. Outreach letter sent on 01/29/18.  Plan: Close case.  Addendum: RNCM no longer involved in case. RNCM notified Danford BadJoanna Saporito, LCSW and discipline closure letter sent to primary care.  Kathyrn SheriffJuana Giavonni Cizek, RN, MSN, [facility]BSN,CCM [facility] Community Care Coordinator Cell: [phone]

## 2018-02-09 ENCOUNTER — Ambulatory Visit (INDEPENDENT_AMBULATORY_CARE_PROVIDER_SITE_OTHER): Payer: Medicare HMO | Admitting: Adult Health

## 2018-02-09 ENCOUNTER — Encounter: Payer: Self-pay | Admitting: Adult Health

## 2018-02-09 ENCOUNTER — Ambulatory Visit (INDEPENDENT_AMBULATORY_CARE_PROVIDER_SITE_OTHER)
Admission: RE | Admit: 2018-02-09 | Discharge: 2018-02-09 | Disposition: A | Discharge status: Home / Self Care | Payer: Medicare HMO | Source: Ambulatory Visit | Attending: Adult Health | Admitting: Adult Health

## 2018-02-09 ENCOUNTER — Other Ambulatory Visit: Payer: Self-pay | Admitting: *Deleted

## 2018-02-09 VITALS — BP 140/88 | HR 90

## 2018-02-09 DIAGNOSIS — R69 Illness, unspecified: Secondary | ICD-10-CM | POA: Diagnosis not present

## 2018-02-09 DIAGNOSIS — M545 Low back pain: Secondary | ICD-10-CM | POA: Diagnosis not present

## 2018-02-09 DIAGNOSIS — J9611 Chronic respiratory failure with hypoxia: Secondary | ICD-10-CM | POA: Diagnosis not present

## 2018-02-09 DIAGNOSIS — G8921 Chronic pain due to trauma: Secondary | ICD-10-CM | POA: Diagnosis not present

## 2018-02-09 DIAGNOSIS — J189 Pneumonia, unspecified organism: Secondary | ICD-10-CM

## 2018-02-09 DIAGNOSIS — R05 Cough: Secondary | ICD-10-CM | POA: Diagnosis not present

## 2018-02-09 DIAGNOSIS — R0602 Shortness of breath: Secondary | ICD-10-CM | POA: Diagnosis not present

## 2018-02-09 DIAGNOSIS — Z79899 Other long term (current) drug therapy: Secondary | ICD-10-CM | POA: Diagnosis not present

## 2018-02-09 DIAGNOSIS — B20 Human immunodeficiency virus [HIV] disease: Secondary | ICD-10-CM

## 2018-02-09 DIAGNOSIS — J9612 Chronic respiratory failure with hypercapnia: Secondary | ICD-10-CM | POA: Diagnosis not present

## 2018-02-09 DIAGNOSIS — J441 Chronic obstructive pulmonary disease with (acute) exacerbation: Secondary | ICD-10-CM | POA: Diagnosis not present

## 2018-02-09 MED ORDER — MOMETASONE FURO-FORMOTEROL FUM 200-5 MCG/ACT IN AERO: 2.0000 | INHALATION_SPRAY | Freq: Two times a day (BID) | RESPIRATORY_TRACT | 5 refills | Status: DC

## 2018-02-09 MED ORDER — UMECLIDINIUM BROMIDE 62.5 MCG/INH IN AEPB: 1.0000 | INHALATION_SPRAY | Freq: Every day | RESPIRATORY_TRACT | 5 refills | Status: DC
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
## 2018-02-09 NOTE — Assessment & Plan Note (Signed)
Continue on oxygen 

## 2018-02-09 NOTE — Assessment & Plan Note (Signed)
Continue with follow-up with ID clinic as planned

## 2018-02-09 NOTE — Patient Instructions (Signed)
Continue on INCRUSE 1 puff daily.  Continue on Dulera 2 puffs  Twice daily. , brush rinse and gargle after use.  Important to quit smoking  Mucinex DM Twice daily As needed  Cough/congestion  Continue on O2 2l/m .  Follow up Dr. Sherene SiresWert  In 6-8 weeks with chest xray and As needed   Keep with follow up with ID clinic .  Please contact office for sooner follow up if symptoms do not improve or worsen or seek emergency care

## 2018-02-09 NOTE — Assessment & Plan Note (Signed)
Recurrent COPD exacerbations and patient with active smoking.  Patient is immunocompromised.  Has had recurrent hospitalizations.  For now we will continue on current regimen with Dulera and Incruse.  Steroid/antiviral patient education given  Plan  Patient Instructions  Continue on INCRUSE 1 puff daily.  Continue on Dulera 2 puffs  Twice daily. , brush rinse and gargle after use.  Important to quit smoking  Mucinex DM Twice daily As needed  Cough/congestion  Continue on O2 2l/m .  Follow up Dr. Sherene SiresWert  In 6-8 weeks with chest xray and As needed   Keep with follow up with ID clinic .  Please contact office for sooner follow up if symptoms do not improve or worsen or seek emergency care

## 2018-02-09 NOTE — Patient Outreach (Signed)
[facility] Care Management  02/09/2018  Chris FavorsJerome A Winiecki Jr. 01/24/1966 [id number]   CSW made a second attempt to try and contact patient today to perform phone assessment, as well as assess and assist with social work needs and services, without success.  A HIPAA compliant message was left for patient on voicemail.  CSW continues to await a return call.  CSW will make a third and final outreach attempt within the next 3-4 business days, if a return call is not received from patient in the meantime.  CSW will then proceed with case closure if a return call is not received from patient with a total of 10 business days, as required number of phone attempts will have been made and outreach letter mailed.  Danford BadJoanna Saporito, BSW, MSW, LCSW  Licensed Restaurant manager, fast foodClinical Social Worker  [facility] Care Management [place] [facility]  Mailing [place], [place], [place] [address] Physical Address-[address], [place], [place] [address] Toll Free Main # [phone] Fax # [phone] Cell # [phone]  Office # [phone] [email or web address]

## 2018-02-09 NOTE — Assessment & Plan Note (Signed)
Clinically patient is improving.  Chest x-ray shows improvement.  Will check chest x-ray for complete clearance on return.

## 2018-02-13 DIAGNOSIS — J129 Viral pneumonia, unspecified: Secondary | ICD-10-CM | POA: Diagnosis not present

## 2018-02-13 DIAGNOSIS — J449 Chronic obstructive pulmonary disease, unspecified: Secondary | ICD-10-CM | POA: Diagnosis not present

## 2018-02-15 NOTE — Progress Notes (Signed)
Chart and office note reviewed in detail  > agree with a/p as outlined    

## 2018-02-16 ENCOUNTER — Other Ambulatory Visit: Payer: Self-pay | Admitting: *Deleted

## 2018-02-16 NOTE — Patient Outreach (Signed)
[facility] Care Management  02/16/2018  Ruel FavorsJerome A Hisaw Jr. 07/16/1966 [id number]    CSW made a third and final attempt to try and contact patient today to perform phone assessment, as well as assess and assist with social work needs and services, without success.  A HIPAA compliant message was left for patient on voicemail.  CSW is currently awaiting a return call.  CSW will proceed with case closure in two business days, if a return call is not received in the meantime, as required number of phone attempts have been made and an outreach letter was mailed to patient's home allowing 10 business days for a response. Danford BadJoanna Kilyn Maragh, BSW, MSW, LCSW  Licensed Restaurant manager, fast foodClinical Social Worker  [facility] Care Management [place] [facility]  Mailing [place], [place], [place] [address] Physical Address-[address], [place], [place] [address] Toll Free Main # [phone] Fax # [phone] Cell # [phone]  Office # [phone] [email or web address]

## 2018-02-18 ENCOUNTER — Other Ambulatory Visit: Payer: Self-pay | Admitting: *Deleted

## 2018-02-18 ENCOUNTER — Encounter: Payer: Self-pay | Admitting: *Deleted

## 2018-02-18 NOTE — Patient Outreach (Signed)
[facility] Care Management  02/18/2018  Chris FavorsJerome A Harps [name]. 04/10/1966 [id number]   CSW will perform a case closure on patient, due to inability to maintain phone contact, despite required number of phone attempts made and outreach letter mailed to patient's home allowing 10 business days for a response.  CSW will fax an update to patient's Primary Care Physician, Dr. Bertram DenverZelda Fleming to ensure that they are aware of CSW's involvement with patient's plan of care.   Danford BadJoanna Zenith Kercheval, BSW, MSW, LCSW  Licensed Restaurant manager, fast foodClinical Social Worker  [facility] Care Management [place] [facility]  Mailing [place], [place], [place] [address] Physical Address-[address], [place], [place] [address] Toll Free Main # [phone] Fax # [phone] Cell # [phone]  Office # [phone] [email or web address]

## 2018-02-24 ENCOUNTER — Telehealth: Payer: Self-pay | Admitting: Internal Medicine

## 2018-02-24 ENCOUNTER — Telehealth: Payer: Self-pay | Admitting: Behavioral Health

## 2018-02-24 NOTE — Telephone Encounter (Signed)
Patient called stating Chris Dameshley R. Called him and left a message.  Patient states he did not understand what the message meant and was asking to speak to [place] directly.  Patient would like a call back on his Mobile number [phone]. Angeline SlimAshley Cathlene Gardella RN

## 2018-02-24 NOTE — Telephone Encounter (Signed)
Attempted to call pt. I did not receive an answer. I have left a message for pt to return our call.  

## 2018-02-25 NOTE — Telephone Encounter (Signed)
Patient returning call - he can be reached at [phone]

## 2018-02-25 NOTE — Telephone Encounter (Signed)
Spoke with pt. States that Elwin SleightDulera is not covered by his insurance. Advised him to get a copy of his drug formulary so that we can pick from the ones that are covered. He verbalized understanding.

## 2018-02-27 ENCOUNTER — Other Ambulatory Visit: Payer: Self-pay | Admitting: Internal Medicine

## 2018-03-01 ENCOUNTER — Telehealth: Payer: Self-pay | Admitting: Internal Medicine

## 2018-03-01 MED ORDER — BUDESONIDE-FORMOTEROL FUMARATE 160-4.5 MCG/ACT IN AERO: 2.0000 | INHALATION_SPRAY | Freq: Two times a day (BID) | RESPIRATORY_TRACT | 11 refills | Status: DC

## 2018-03-01 NOTE — Telephone Encounter (Signed)
Pt called back, states that covered alternatives for Dulera are Advair, Anoro, Breo, Trelegy, Symbicort, and Bevespi.  Pt also notes that he is on Incruse as well.  Pt would like alternative rx called in asap.    [facility] [address]  MW please advise on covered alternative.  Thanks!

## 2018-03-01 NOTE — Telephone Encounter (Signed)
States is out and needs one of these called in today.

## 2018-03-01 NOTE — Telephone Encounter (Signed)
rx sent and pt aware 

## 2018-03-01 NOTE — Telephone Encounter (Signed)
symbicort 160 2bid  

## 2018-03-02 ENCOUNTER — Ambulatory Visit: Payer: Medicare HMO | Attending: Nurse Practitioner | Admitting: Nurse Practitioner

## 2018-03-02 ENCOUNTER — Encounter: Payer: Self-pay | Admitting: Nurse Practitioner

## 2018-03-02 VITALS — BP 140/81 | HR 87 | Temp 98.6°F | Ht 69.0 in | Wt 238.2 lb

## 2018-03-02 DIAGNOSIS — Z8679 Personal history of other diseases of the circulatory system: Secondary | ICD-10-CM

## 2018-03-02 DIAGNOSIS — I4891 Unspecified atrial fibrillation: Secondary | ICD-10-CM | POA: Diagnosis not present

## 2018-03-02 DIAGNOSIS — Z808 Family history of malignant neoplasm of other organs or systems: Secondary | ICD-10-CM | POA: Insufficient documentation

## 2018-03-02 DIAGNOSIS — Z833 Family history of diabetes mellitus: Secondary | ICD-10-CM | POA: Diagnosis not present

## 2018-03-02 DIAGNOSIS — E118 Type 2 diabetes mellitus with unspecified complications: Secondary | ICD-10-CM

## 2018-03-02 DIAGNOSIS — Z9981 Dependence on supplemental oxygen: Secondary | ICD-10-CM | POA: Insufficient documentation

## 2018-03-02 DIAGNOSIS — Z7901 Long term (current) use of anticoagulants: Secondary | ICD-10-CM | POA: Insufficient documentation

## 2018-03-02 DIAGNOSIS — Z9119 Patient's noncompliance with other medical treatment and regimen: Secondary | ICD-10-CM | POA: Diagnosis not present

## 2018-03-02 DIAGNOSIS — J9611 Chronic respiratory failure with hypoxia: Secondary | ICD-10-CM | POA: Diagnosis not present

## 2018-03-02 DIAGNOSIS — Z794 Long term (current) use of insulin: Secondary | ICD-10-CM

## 2018-03-02 DIAGNOSIS — Z882 Allergy status to sulfonamides status: Secondary | ICD-10-CM | POA: Insufficient documentation

## 2018-03-02 DIAGNOSIS — I509 Heart failure, unspecified: Secondary | ICD-10-CM | POA: Diagnosis not present

## 2018-03-02 DIAGNOSIS — K219 Gastro-esophageal reflux disease without esophagitis: Secondary | ICD-10-CM | POA: Diagnosis not present

## 2018-03-02 DIAGNOSIS — F419 Anxiety disorder, unspecified: Secondary | ICD-10-CM | POA: Diagnosis not present

## 2018-03-02 DIAGNOSIS — Z79899 Other long term (current) drug therapy: Secondary | ICD-10-CM | POA: Diagnosis not present

## 2018-03-02 DIAGNOSIS — E1140 Type 2 diabetes mellitus with diabetic neuropathy, unspecified: Secondary | ICD-10-CM | POA: Insufficient documentation

## 2018-03-02 DIAGNOSIS — F329 Major depressive disorder, single episode, unspecified: Secondary | ICD-10-CM | POA: Insufficient documentation

## 2018-03-02 DIAGNOSIS — B20 Human immunodeficiency virus [HIV] disease: Secondary | ICD-10-CM | POA: Insufficient documentation

## 2018-03-02 DIAGNOSIS — Z8249 Family history of ischemic heart disease and other diseases of the circulatory system: Secondary | ICD-10-CM | POA: Insufficient documentation

## 2018-03-02 DIAGNOSIS — Z21 Asymptomatic human immunodeficiency virus [HIV] infection status: Secondary | ICD-10-CM

## 2018-03-02 DIAGNOSIS — I110 Hypertensive heart disease with heart failure: Secondary | ICD-10-CM | POA: Diagnosis not present

## 2018-03-02 DIAGNOSIS — J449 Chronic obstructive pulmonary disease, unspecified: Secondary | ICD-10-CM | POA: Insufficient documentation

## 2018-03-02 DIAGNOSIS — R29898 Other symptoms and signs involving the musculoskeletal system: Secondary | ICD-10-CM | POA: Diagnosis not present

## 2018-03-02 DIAGNOSIS — R69 Illness, unspecified: Secondary | ICD-10-CM | POA: Diagnosis not present

## 2018-03-02 LAB — GLUCOSE, POCT (MANUAL RESULT ENTRY): POC GLUCOSE: 118 mg/dL — AB (ref 70–99)

## 2018-03-02 MED ORDER — APIXABAN 5 MG PO TABS: 5.0000 mg | ORAL_TABLET | Freq: Two times a day (BID) | ORAL | 1 refills | Status: DC

## 2018-03-02 MED ORDER — ISOSORBIDE MONONITRATE ER 30 MG PO TB24: 30.0000 mg | ORAL_TABLET | Freq: Every day | ORAL | 0 refills | Status: DC

## 2018-03-02 MED ORDER — GABAPENTIN 300 MG PO CAPS: 600.0000 mg | ORAL_CAPSULE | Freq: Three times a day (TID) | ORAL | 2 refills | Status: DC

## 2018-03-02 MED ORDER — HYDRALAZINE HCL 50 MG PO TABS: 50.0000 mg | ORAL_TABLET | Freq: Three times a day (TID) | ORAL | 2 refills | Status: DC
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
## 2018-03-02 NOTE — Progress Notes (Deleted)
1/18

## 2018-03-02 NOTE — Patient Instructions (Signed)
Diabetes blood sugar goals  Fasting in AM before breakfast which means at least 8 hrs of no eating or drinking) except water or unsweetened coffee or tea): 90-130 2 hrs after meals: < 180,   Hypoglycemia or low blood sugar: < 70 (You should not have hypoglycemia.)  Aim for 30 minutes of exercise most days. Rethink what you drink. Water is great! Aim for 2-3 Carb Choices per meal (30-45 grams) +/- 1 either way  Aim for 0-15 Carbs per snack if hungry  Include protein in moderation with your meals and snacks  Consider reading food labels for Total Carbohydrate and Fat Grams of foods  Consider checking BG at alternate times per day  Continue taking medication as directed Be mindful about how much sugar you are adding to beverages and other foods. Fruit Punch - find one with no sugar  Measure and decrease portions of carbohydrate foods  Make your plate and don't go back for seconds 

## 2018-03-03 ENCOUNTER — Ambulatory Visit (INDEPENDENT_AMBULATORY_CARE_PROVIDER_SITE_OTHER): Payer: Medicare HMO | Admitting: Internal Medicine

## 2018-03-03 ENCOUNTER — Encounter: Payer: Self-pay | Admitting: Internal Medicine

## 2018-03-03 ENCOUNTER — Encounter: Payer: Self-pay | Admitting: Nurse Practitioner

## 2018-03-03 VITALS — BP 129/73 | HR 103 | Temp 98.6°F | Wt 260.0 lb

## 2018-03-03 DIAGNOSIS — R69 Illness, unspecified: Secondary | ICD-10-CM | POA: Diagnosis not present

## 2018-03-03 DIAGNOSIS — B20 Human immunodeficiency virus [HIV] disease: Secondary | ICD-10-CM | POA: Diagnosis not present

## 2018-03-03 DIAGNOSIS — N182 Chronic kidney disease, stage 2 (mild): Secondary | ICD-10-CM

## 2018-03-03 DIAGNOSIS — Z23 Encounter for immunization: Secondary | ICD-10-CM | POA: Diagnosis not present

## 2018-03-03 LAB — CMP14+EGFR
ALT: 12 IU/L (ref 0–44)
AST: 17 IU/L (ref 0–40)
Albumin/Globulin Ratio: 1.1 — ABNORMAL LOW (ref 1.2–2.2)
Albumin: 4.1 g/dL (ref 3.5–5.5)
Alkaline Phosphatase: 39 IU/L (ref 39–117)
BUN / CREAT RATIO: 11 (ref 9–20)
BUN: 14 mg/dL (ref 6–24)
Bilirubin Total: <0.2 mg/dL (ref 0.0–1.2)
CALCIUM: 9.5 mg/dL (ref 8.7–10.2)
CO2: 24 mmol/L (ref 20–29)
Chloride: 100 mmol/L (ref 96–106)
Creatinine, Ser: 1.22 mg/dL (ref 0.76–1.27)
GFR calc Af Amer: 79 mL/min/{1.73_m2} (ref >59)
GFR calc non Af Amer: 68 mL/min/{1.73_m2} (ref >59)
Globulin, Total: 3.7 g/dL (ref 1.5–4.5)
Glucose: 100 mg/dL — ABNORMAL HIGH (ref 65–99)
Potassium: 3.8 mmol/L (ref 3.5–5.2)
Sodium: 139 mmol/L (ref 134–144)
Total Protein: 7.8 g/dL (ref 6.0–8.5)

## 2018-03-03 LAB — CBC WITH DIFFERENTIAL/PLATELET
BASOS PCT: 0.6 %
Basophils Absolute: 62 cells/uL (ref 0–200)
EOS ABS: 258 {cells}/uL (ref 15–500)
Eosinophils Relative: 2.5 %
HEMATOCRIT: 34.2 % — AB (ref 38.5–50.0)
Hemoglobin: 11.1 g/dL — ABNORMAL LOW (ref 13.2–17.1)
LYMPHS ABS: 3502 {cells}/uL (ref 850–3900)
MCH: 28 pg (ref 27.0–33.0)
MCHC: 32.5 g/dL (ref 32.0–36.0)
MCV: 86.4 fL (ref 80.0–100.0)
MPV: 10.6 fL (ref 7.5–12.5)
Monocytes Relative: 10.6 %
NEUTROS PCT: 52.3 %
Neutro Abs: 5387 cells/uL (ref 1500–7800)
Platelets: 290 10*3/uL (ref 140–400)
RBC: 3.96 10*6/uL — ABNORMAL LOW (ref 4.20–5.80)
RDW: 13 % (ref 11.0–15.0)
TOTAL LYMPHOCYTE: 34 %
WBC: 10.3 10*3/uL (ref 3.8–10.8)
WBCMIX: 1092 {cells}/uL — AB (ref 200–950)

## 2018-03-03 NOTE — Progress Notes (Signed)
RFV: follow up for hiv management  Patient ID: Chris Round., male   DOB: 09-11-1965, 52 y.o.   MRN: [id number]  HPI Chris [name] a 52yo M with well controlled HIV disease, DM, COPD,with supp O2 2LNC, who has had significant Hospitalization in the winter and recently for hcap in June, given steroids to help with symptoms.  He was on temp hd in feb, no longer needs it.  Dm under better control. Wearing supp o2 at 2L/[place] almost all the time.  Continues excellent adherence in terms of his hiv management.  Outpatient Encounter Medications as of 03/03/2018  Medication Sig  . acetaminophen (TYLENOL) 500 MG tablet Take 1,000 mg by mouth every 6 (six) hours as needed for headache (pain).  [name] albuterol (PROVENTIL HFA;VENTOLIN HFA) 108 (90 Base) MCG/ACT inhaler Inhale 2 puffs into the lungs every 6 (six) hours as needed for wheezing or shortness of breath.  [name] amiodarone (PACERONE) 200 MG tablet Take 200 mg by mouth daily.  [name] amitriptyline (ELAVIL) 100 MG tablet Take 1 tablet (100 mg total) by mouth at bedtime.  [name] apixaban (ELIQUIS) 5 MG TABS tablet Take 1 tablet (5 mg total) by mouth 2 (two) times daily.  [name] BIKTARVY 50-200-25 MG TABS tablet TAKE 1 TABLET BY MOUTH EVERY DAY  . Blood Glucose Monitoring Suppl (ONETOUCH VERIO) w/Device KIT 1 kit by Does not apply route 2 (two) times daily.  . budesonide-formoterol (SYMBICORT) 160-4.5 MCG/ACT inhaler Inhale 2 puffs into the lungs 2 (two) times daily.  . dapsone 100 MG tablet Take 1 tablet (100 mg total) by mouth daily with breakfast.  . diltiazem (CARDIZEM CD) 240 MG 24 hr capsule Take 1 capsule (240 mg total) by mouth daily.  [name] docusate sodium (COLACE) 100 MG capsule Take 100 mg by mouth daily as needed for mild constipation.  . furosemide (LASIX) 40 MG tablet Take 40 mg by mouth 2 (two) times daily.  [name] gabapentin (NEURONTIN) 300 MG capsule Take 2 capsules (600 mg total) by mouth 3 (three) times daily.  [name] glucose blood (ONETOUCH VERIO) test strip Use as  instructed  . hydrALAZINE (APRESOLINE) 50 MG tablet Take 1 tablet (50 mg total) by mouth every 8 (eight) hours.  . Insulin Detemir (LEVEMIR) 100 UNIT/ML Pen Inject 30 Units into the skin daily at 10 pm.  . Insulin Pen Needle (B-D UF III MINI PEN NEEDLES) 31G X 5 MM MISC Use as instructed  . isosorbide mononitrate (IMDUR) 30 MG 24 hr tablet Take 1 tablet (30 mg total) by mouth daily.  . metFORMIN (GLUCOPHAGE) 1000 MG tablet Take 1 tablet (1,000 mg total) by mouth daily with breakfast. (Patient taking differently: Take 1,000 mg by mouth 2 (two) times daily with a meal. )  . Misc. Devices MISC Please provide patient with a portable oximeter. Diagnosis: COPD GOLD III  . mometasone-formoterol (DULERA) 200-5 MCG/ACT AERO Inhale 2 puffs into the lungs 2 (two) times daily.  Glory Rosebush DELICA LANCETS 78M MISC Use as instructed  . oxyCODONE (OXY IR/ROXICODONE) 5 MG immediate release tablet Take 1 tablet (5 mg total) by mouth 3 (three) times daily as needed for up to 15 doses for moderate pain or severe pain (pain).  . OXYGEN Inhale 2 L into the lungs as needed (shortness of breath).  . potassium chloride SA (K-DUR,KLOR-CON) 20 MEQ tablet Take 20 mEq by mouth daily.  [name] senna (SENOKOT) 8.6 MG TABS tablet Take 1 tablet by mouth 2 (two) times daily as needed for mild  constipation.  [name] umeclidinium bromide (INCRUSE ELLIPTA) 62.5 MCG/INH AEPB Inhale 1 puff into the lungs daily.  . [DISCONTINUED] insulin glargine (LANTUS) 100 UNIT/ML injection Inject 0.2 mLs (20 Units total) into the skin 2 (two) times daily. (Patient taking differently: Inject 30 Units into the skin daily. )   No facility-administered encounter medications on file as of 03/03/2018.      Patient Active Problem List   Diagnosis Date Noted  . Acute and chronic respiratory failure with hypoxia  01/21/2018  . Left lower lobe pneumonia/HCAP 01/21/2018  . Human immunodeficiency virus (HIV) disease /CD4 60 May 2019 01/21/2018  . COPD (chronic  obstructive pulmonary disease) ([place]) 01/21/2018  . Diabetes mellitus type 2, insulin dependent ([place]) 01/21/2018  . Hypertension/chronic diastolic heart failure 16/05/9603  . Paroxysmal atrial fibrillation ([place]) 01/21/2018  . Physical deconditioning 01/21/2018  . Sepsis ([place]) 01/21/2018  . HCAP (healthcare-associated pneumonia) 01/14/2018  . Esophageal candidiasis ([place])   . Dysphasia   . Anemia   . Evaluation by psychiatric service required   . Goals of care, counseling/discussion   . Elevated troponin   . AKI (acute kidney injury) ([place])   . Endotracheally intubated   . Diabetes mellitus type 2 in obese ([place]) 10/01/2017  . Atrial fibrillation ([place]) 10/01/2017  . HIV (human immunodeficiency virus infection) ([place]) 10/01/2017  . Chronic diastolic heart failure ([place])- exacerbated by Afib. 09/25/2017  . PAF (paroxysmal atrial fibrillation) ([place]) 09/25/2017  . Chest pain with moderate risk for cardiac etiology 09/25/2017  . Depression 07/21/2017  . GERD (gastroesophageal reflux disease) 07/21/2017  . Acute on chronic respiratory failure with hypoxia ([place]) 07/21/2017  . Dental caries 06/01/2017  . Special screening for malignant neoplasms, colon   . Dysphagia   . Candida esophagitis ([place])   . Candidiasis of mouth 03/27/2016  . Adjustment disorder with depressed mood 01/04/2016  . Callus of foot 01/04/2016  . Hoarseness of voice 10/04/2015  . Cigarette nicotine dependence without complication 54/04/8118  . Primary osteoarthritis of right knee 03/26/2015  . Genital warts 01/22/2015  . Other male erectile dysfunction 01/22/2015  . Essential hypertension, benign 12/04/2014  . Atopic eczema 12/04/2014  . COPD exacerbation ([place]) 08/23/2013  . Cyst (solitary) of breast 08/23/2013  . Obstructive sleep apnea syndrome 08/23/2013  . Steroid-induced hyperglycemia 01/12/2013  . Chronic respiratory failure with hypoxia and hypercapnia ([place]) 01/12/2013  . COPD  GOLD III, still smoking 01/11/2013    . HIV disease ([place]) 12/06/2012     Health Maintenance Due  Topic Date Due  . FOOT EXAM  04/12/1976  . OPHTHALMOLOGY EXAM  04/12/1976  . TETANUS/TDAP  04/12/1985  . PNEUMOCOCCAL POLYSACCHARIDE VACCINE (2) 05/11/2017  . URINE MICROALBUMIN  11/12/2017     Review of Systems Shortness of breath associated with lung disease. 12 point ros is otherwise negative Physical Exam   BP 129/73   Pulse (!) 103   Temp 98.6 F (37 C) (Oral)   Wt 260 lb (117.9 kg)   BMI 38.40 kg/m   Physical Exam  Constitutional: He is oriented to person, place, and time. He appears well-developed and well-nourished. No distress. Fatigue appearing HENT:  Mouth/Throat: Oropharynx is clear and moist. No oropharyngeal exudate.  Cardiovascular: Normal rate, regular rhythm and normal heart sounds. Exam reveals no gallop and no friction rub.  No murmur heard.  Pulmonary/Chest: Effort normal and breath sounds normal. No respiratory distress. He has no wheezes.  Abdominal: Soft. Bowel sounds are normal. He exhibits no distension. There is no tenderness.  Lymphadenopathy:  He has no cervical adenopathy.  Neurological: He is alert and oriented to person, place, and time.  Skin: Skin is warm and dry. No rash noted. No erythema.  Psychiatric: He has a normal mood and affect. His behavior is normal.    Lab Results  Component Value Date   CD4TCELL 10 (L) 01/22/2018   Lab Results  Component Value Date   CD4TABS 210 (L) 01/22/2018   CD4TABS 60 (L) 12/15/2017   CD4TABS 40 (L) 10/26/2017   Lab Results  Component Value Date   HIV1RNAQUANT <20 01/22/2018   Lab Results  Component Value Date   HEPBSAB Reactive 10/11/2017   Lab Results  Component Value Date   LABRPR NON REAC 06/23/2016    CBC Lab Results  Component Value Date   WBC 33.2 (H) 01/23/2018   RBC 2.91 (L) 01/23/2018   HGB 8.0 (L) 01/23/2018   HCT 27.0 (L) 01/23/2018   PLT 348 01/23/2018   MCV 92.8 01/23/2018   MCH 27.5 01/23/2018   MCHC  29.6 (L) 01/23/2018   RDW 13.7 01/23/2018   LYMPHSABS 2.7 01/21/2018   MONOABS 1.6 (H) 01/21/2018   EOSABS 0.0 01/21/2018    BMET Lab Results  Component Value Date   NA 139 03/02/2018   K 3.8 03/02/2018   CL 100 03/02/2018   CO2 24 03/02/2018   GLUCOSE 100 (H) 03/02/2018   BUN 14 03/02/2018   CREATININE 1.22 03/02/2018   CALCIUM 9.5 03/02/2018   GFRNONAA 68 03/02/2018   GFRAA 79 03/02/2018      Assessment and Plan  hiv disease= his cd 4 counts had dropped < 50 in setting of his acute illnesses during hospitalization. will check cd 4 count viral load and cbc to see he is back to baseline  Acute on chronic ckd = his cr has rebounded back to baseline. Not needing further HD. No need to adjust meds at this time.  Health maintenance= needs prevnar 13 to be given today

## 2018-03-04 LAB — T-HELPER CELL (CD4) - (RCID CLINIC ONLY)
CD4 % Helper T Cell: 10 % — ABNORMAL LOW (ref 33–55)
CD4 T CELL ABS: 330 /uL — AB (ref 400–2700)

## 2018-03-05 ENCOUNTER — Telehealth: Payer: Self-pay

## 2018-03-05 LAB — HIV-1 RNA QUANT-NO REFLEX-BLD
HIV 1 RNA Quant: <20 copies/mL
HIV-1 RNA Quant, Log: <1.3 Log copies/mL

## 2018-03-05 NOTE — Telephone Encounter (Signed)
-----   Message from Claiborne RiggZelda W Fleming, NP sent at 03/03/2018 11:28 PM EDT ----- Glucose is WNL. Would like for you to make sure you are only taking metformin 1000 mg once a day. If your glucose levels are consistently lower than 90  I would like for you to stop the levemir.

## 2018-03-05 NOTE — Telephone Encounter (Signed)
CMA spoke to patient to inform on lab results and PCP advising.   Patient verified DOB. Patient understood.  

## 2018-03-08 DIAGNOSIS — M25562 Pain in left knee: Secondary | ICD-10-CM | POA: Diagnosis not present

## 2018-03-08 DIAGNOSIS — Z79899 Other long term (current) drug therapy: Secondary | ICD-10-CM | POA: Diagnosis not present

## 2018-03-08 DIAGNOSIS — G894 Chronic pain syndrome: Secondary | ICD-10-CM | POA: Diagnosis not present

## 2018-03-08 DIAGNOSIS — G8921 Chronic pain due to trauma: Secondary | ICD-10-CM | POA: Diagnosis not present

## 2018-03-08 DIAGNOSIS — R69 Illness, unspecified: Secondary | ICD-10-CM | POA: Diagnosis not present

## 2018-03-09 IMAGING — DX DG CHEST 2V
2 series · 3 of 3 positions shown · non-contrast
Comparison: Chest x-ray of 08/08/2016

CLINICAL DATA: Worsening of shortness of breath over the last 2
days, cough, smoking history

EXAM:
CHEST  2 VIEW

[Series 1: chest pa · 0.14mm/px · 2 of 2 slices shown]
[im 1/2]
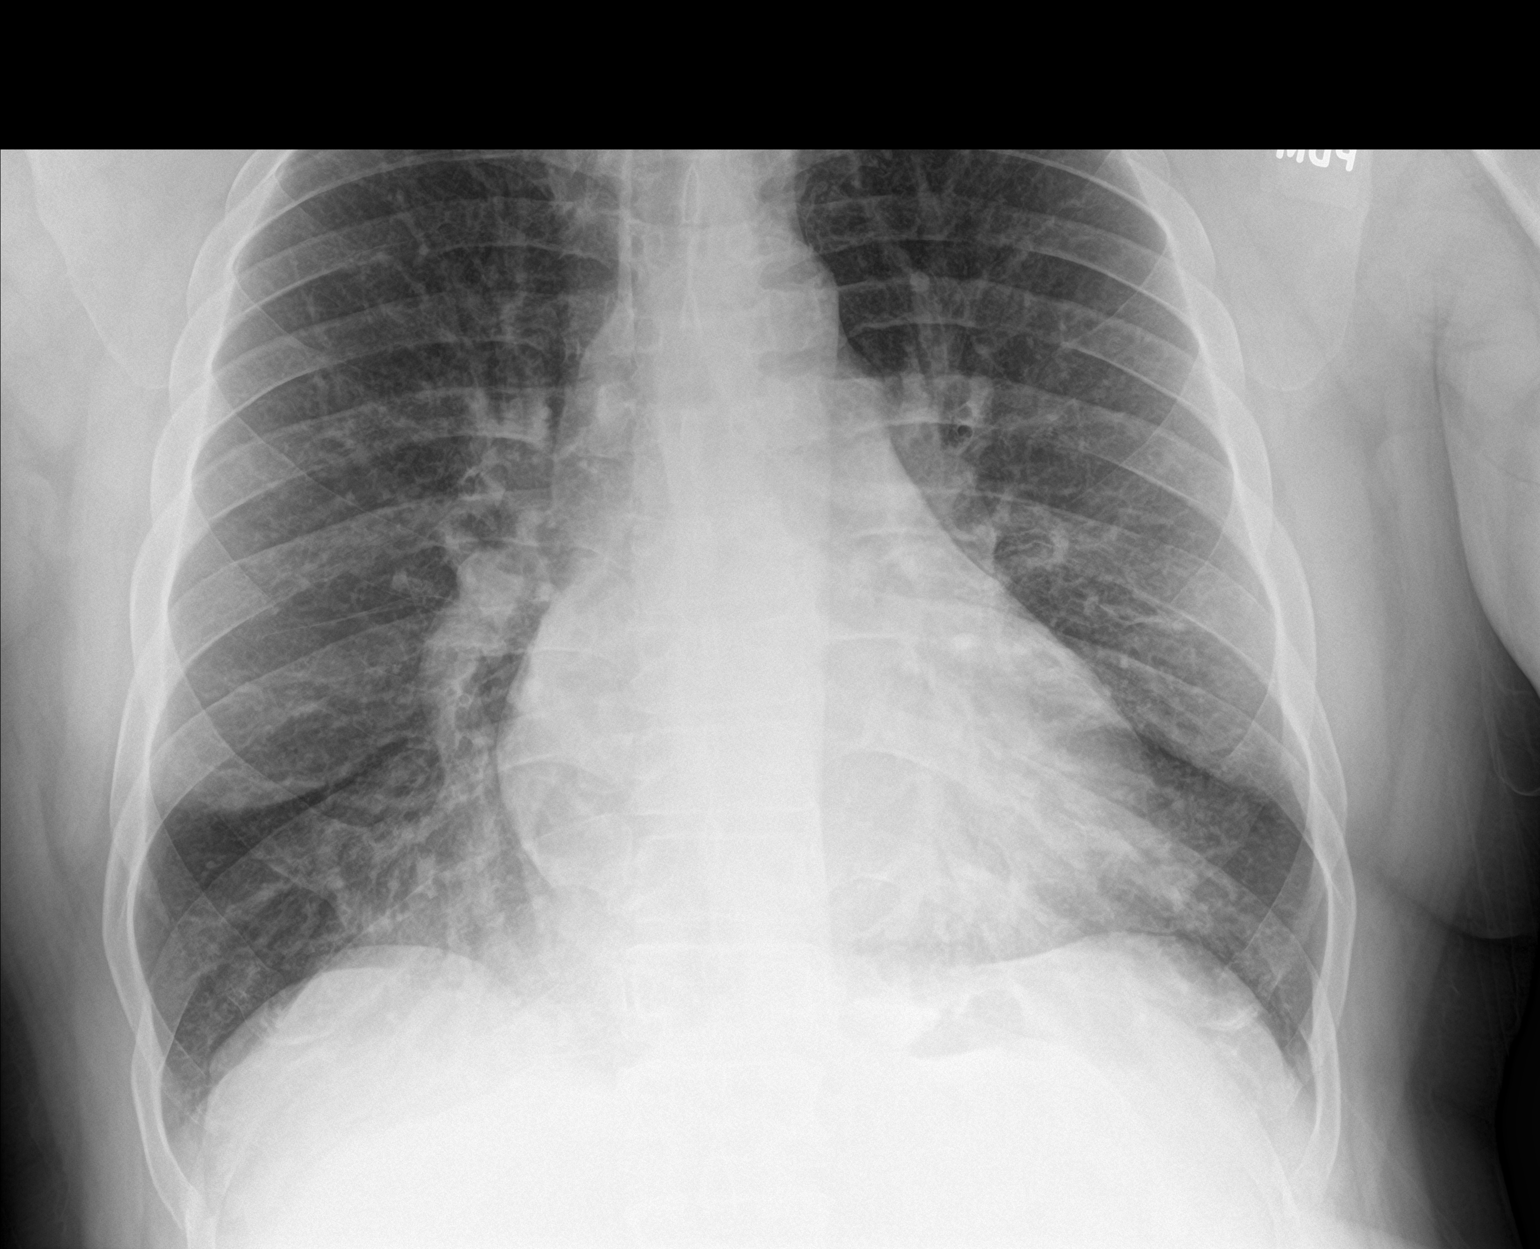
[im 2/2]
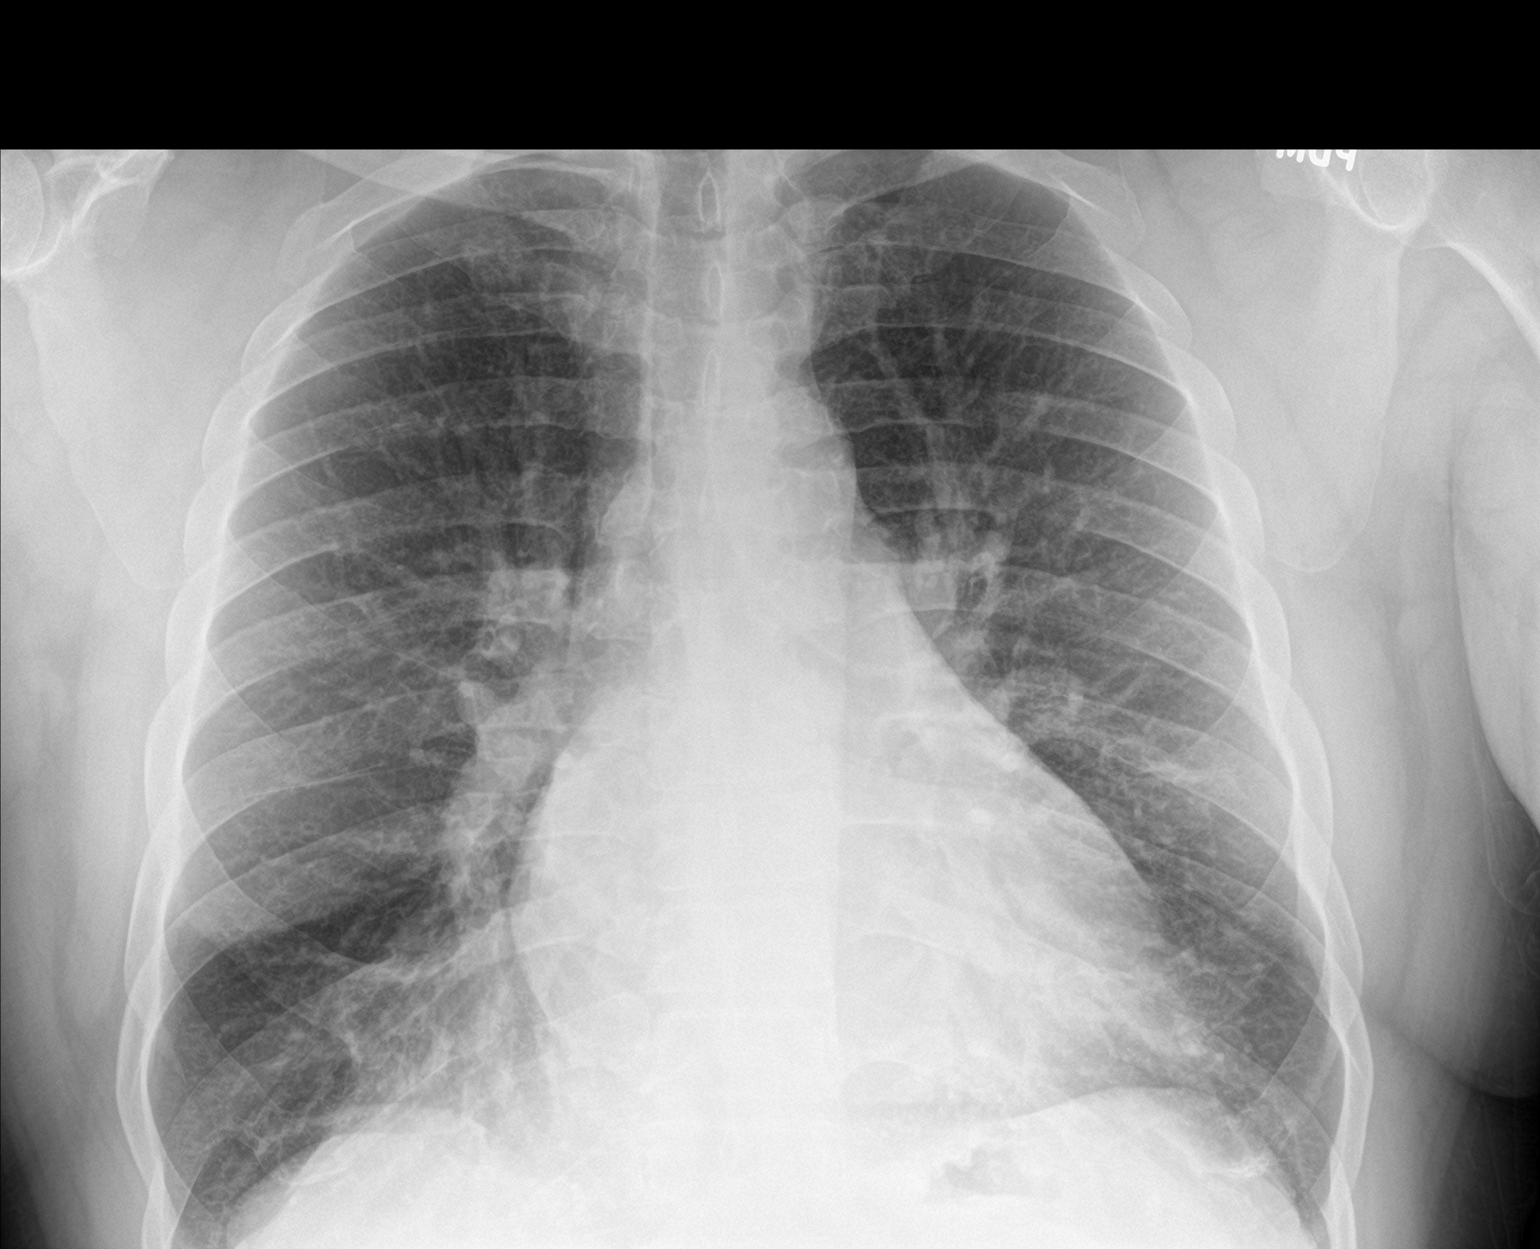

[chest lat]
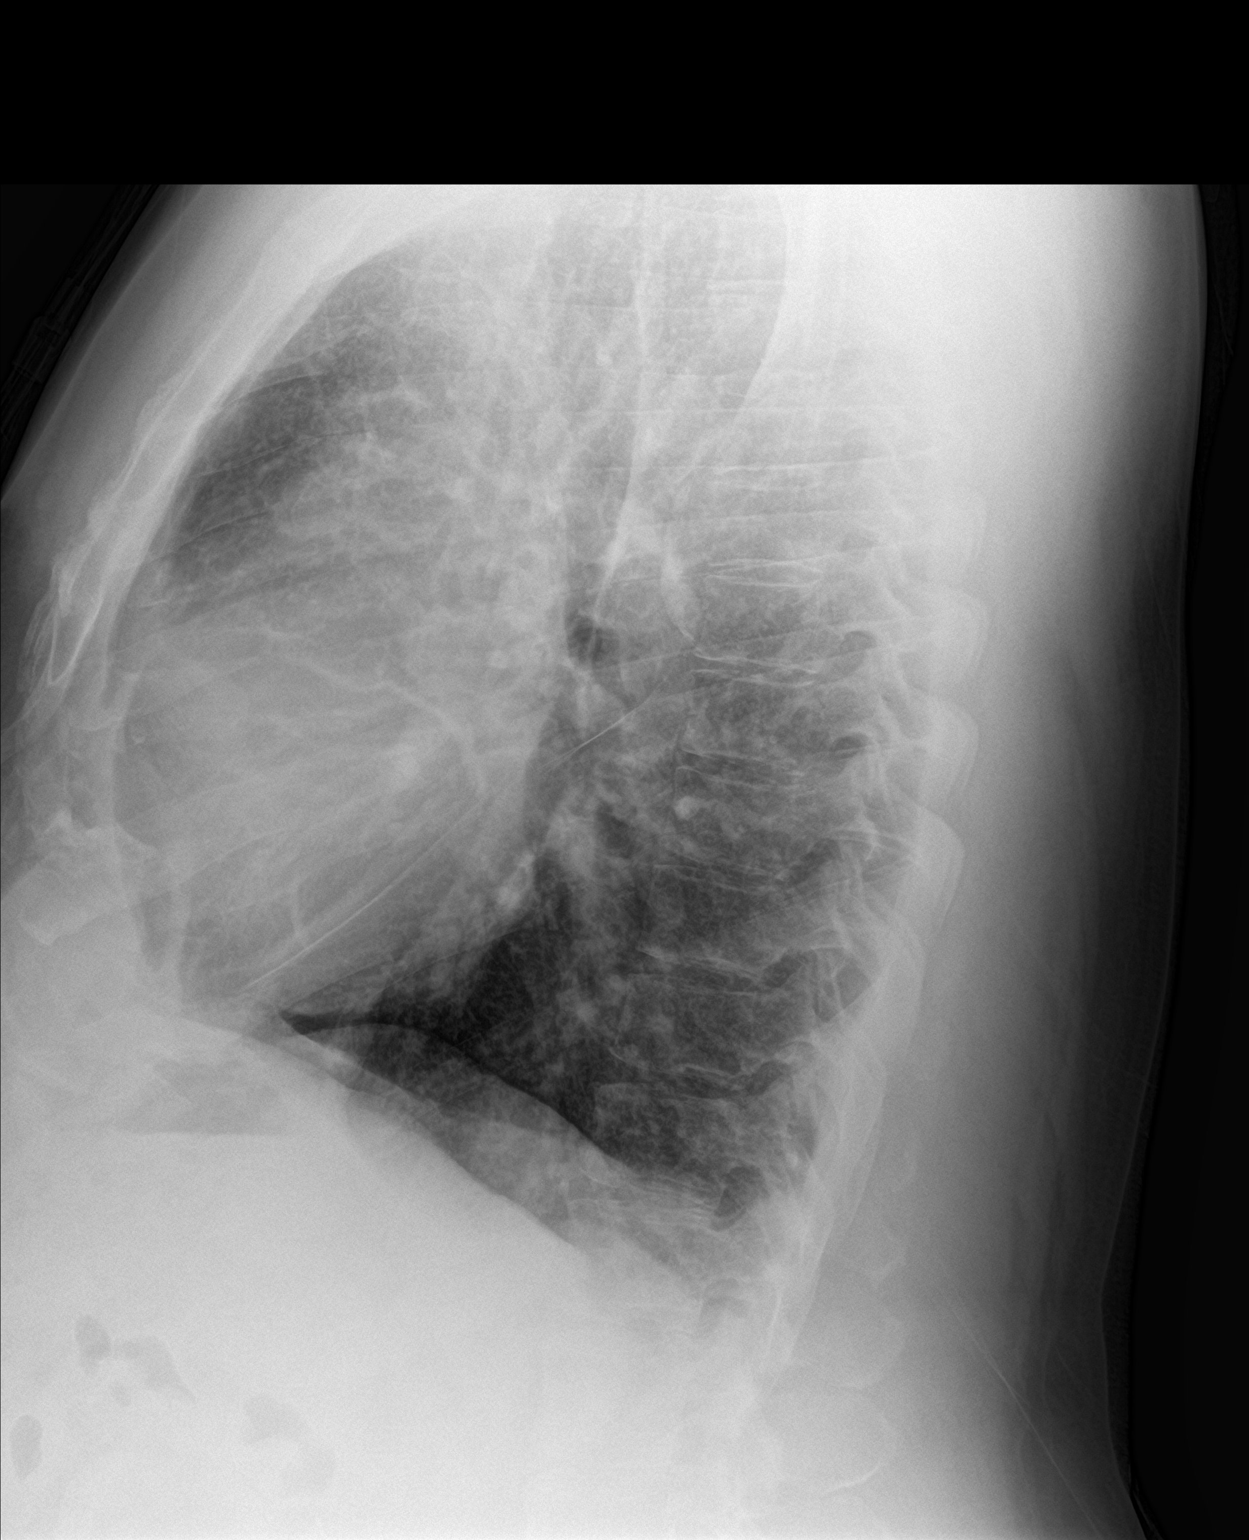

[3 of 3 positions shown; findings below may reference images not displayed]

FINDINGS: The lungs are clear but slightly hyperaerated. There is some
peribronchial thickening most consistent with bronchitis in this
smoking patient. Mediastinal and hilar contours are stable. Mild
cardiomegaly stable. No bony abnormality is seen.
IMPRESSION: 1. Hyperaeration.  No active lung disease.  Question bronchitis.
2. Stable mild cardiomegaly.

## 2018-03-16 DIAGNOSIS — J129 Viral pneumonia, unspecified: Secondary | ICD-10-CM | POA: Diagnosis not present

## 2018-03-16 DIAGNOSIS — J449 Chronic obstructive pulmonary disease, unspecified: Secondary | ICD-10-CM | POA: Diagnosis not present

## 2018-03-19 ENCOUNTER — Other Ambulatory Visit: Payer: Self-pay | Admitting: Internal Medicine

## 2018-03-19 DIAGNOSIS — J449 Chronic obstructive pulmonary disease, unspecified: Secondary | ICD-10-CM

## 2018-03-21 IMAGING — DX DG CHEST 2V
2 series · 2 of 2 positions shown · non-contrast
Comparison: 05/26/2017

CLINICAL DATA: Worsening shortness breath for 3 days. COPD. HIV
disease.

EXAM:
CHEST  2 VIEW

[chest pa]
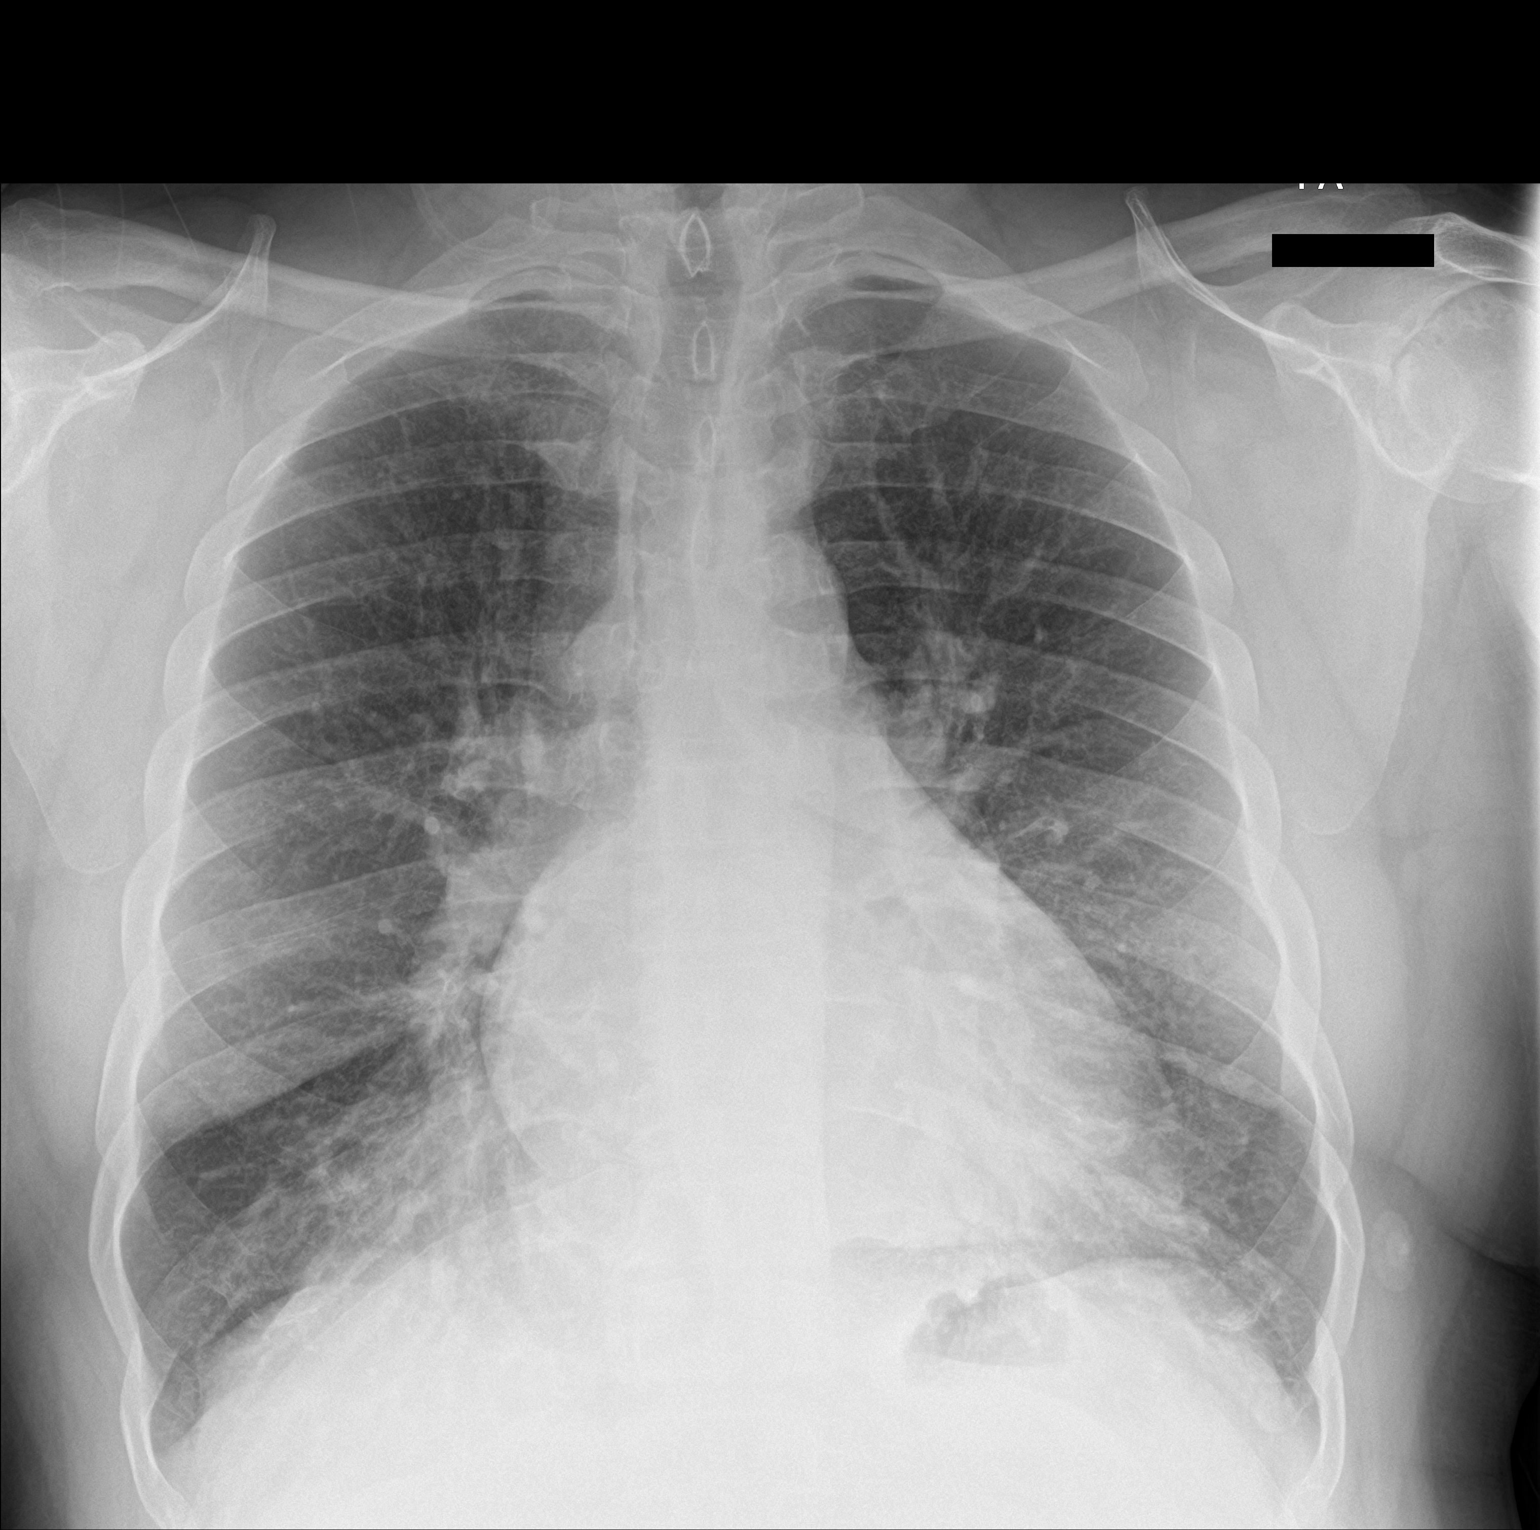

[chest lat]
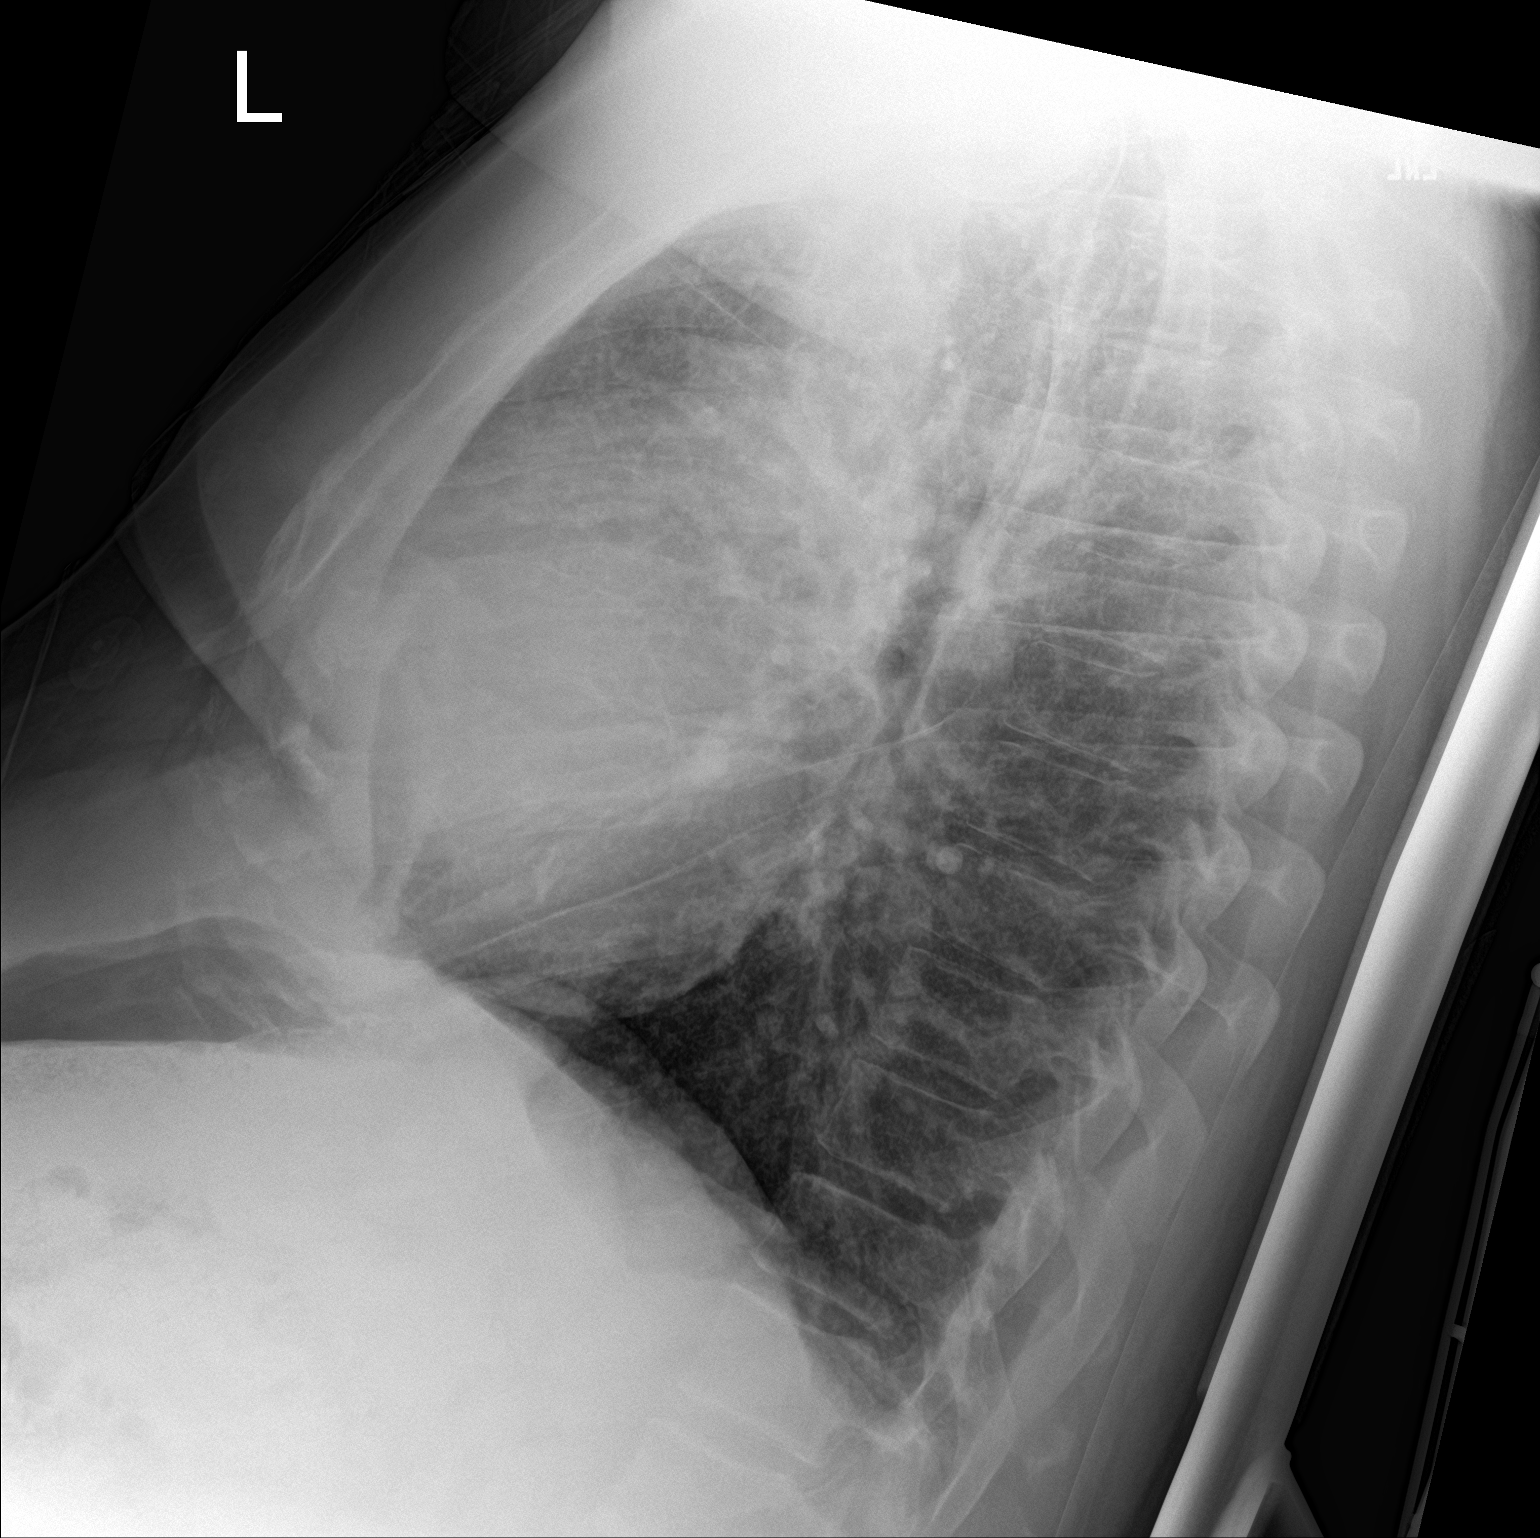

[2 of 2 positions shown; findings below may reference images not displayed]

FINDINGS: Stable borderline cardiomegaly and diffuse pulmonary vascular
congestion. No evidence of acute infiltrate or edema. No evidence of
pleural effusion. Stable pulmonary hyperinflation, consistent with
COPD.
IMPRESSION: Stable borderline cardiomegaly, pulmonary vascular congestion, and
COPD. No acute findings.

## 2018-03-22 ENCOUNTER — Ambulatory Visit (INDEPENDENT_AMBULATORY_CARE_PROVIDER_SITE_OTHER): Payer: Medicare HMO | Admitting: Internal Medicine

## 2018-03-22 ENCOUNTER — Encounter: Payer: Self-pay | Admitting: Internal Medicine

## 2018-03-22 VITALS — BP 140/66 | HR 95 | Ht 67.0 in | Wt 244.0 lb

## 2018-03-22 DIAGNOSIS — J449 Chronic obstructive pulmonary disease, unspecified: Secondary | ICD-10-CM

## 2018-03-22 DIAGNOSIS — F17210 Nicotine dependence, cigarettes, uncomplicated: Secondary | ICD-10-CM | POA: Diagnosis not present

## 2018-03-22 DIAGNOSIS — J9611 Chronic respiratory failure with hypoxia: Secondary | ICD-10-CM | POA: Diagnosis not present

## 2018-03-22 DIAGNOSIS — J9612 Chronic respiratory failure with hypercapnia: Secondary | ICD-10-CM

## 2018-03-22 DIAGNOSIS — R69 Illness, unspecified: Secondary | ICD-10-CM | POA: Diagnosis not present

## 2018-03-22 LAB — PULMONARY FUNCTION TEST
DL/VA % PRED: 72 %
DL/VA: 3.24 ml/min/mmHg/L
DLCO COR: 11.74 ml/min/mmHg
DLCO cor % pred: 41 %
DLCO unc % pred: 36 %
DLCO unc: 10.39 ml/min/mmHg
FEF 25-75 Post: 1.12 L/sec
FEF 25-75 Pre: 0.71 L/sec
FEF2575-%CHANGE-POST: 57 %
FEF2575-%PRED-POST: 37 %
FEF2575-%Pred-Pre: 23 %
FEV1-%CHANGE-POST: 17 %
FEV1-%PRED-PRE: 38 %
FEV1-%Pred-Post: 45 %
FEV1-PRE: 1.14 L
FEV1-Post: 1.34 L
FEV1FVC-%CHANGE-POST: -6 %
FEV1FVC-%Pred-Pre: 84 %
FEV6-%Change-Post: 25 %
FEV6-%PRED-PRE: 45 %
FEV6-%Pred-Post: 57 %
FEV6-PRE: 1.65 L
FEV6-Post: 2.08 L
FEV6FVC-%Change-Post: 0 %
FEV6FVC-%PRED-PRE: 101 %
FEV6FVC-%Pred-Post: 100 %
FVC-%Change-Post: 25 %
FVC-%Pred-Post: 57 %
FVC-%Pred-Pre: 45 %
FVC-POST: 2.12 L
FVC-PRE: 1.69 L
POST FEV6/FVC RATIO: 98 %
Post FEV1/FVC ratio: 63 %
Pre FEV1/FVC ratio: 67 %
Pre FEV6/FVC Ratio: 98 %
RV % PRED: 173 %
RV: 3.31 L
TLC % pred: 82 %
TLC: 5.27 L

## 2018-03-22 NOTE — Assessment & Plan Note (Addendum)
-   04/28/2013 PFT's FEV1 1.21 (39%) ratio 59 and 13% better p B2 and dlco 72 corrects to 102  - started spiriva 04/30/2013 > changed to respimat 02/28/2014 -Med calendar 03/14/2014 > not using as of 05/16/14  - arrived on stiolto / symbicort 02/29/2016 > changed to symbicort 80/spiriva (lower dose ICS due to interaction with aids meds  - PFT's  06/17/2016  FEV1 1.29 (42 % ) ratio 64  p 43 % improvement from saba p symb 80 /spiriva prior to study with DLCO  51/49c % corrects to 86  % for alv volume   - 06/17/2016   change symb to 160 2bid  - 09/25/2016  After extensive coaching device  effectiveness =    90% with dpi/ elipta > changed to Incruse per Insurance restrictions > preferred spiriva - 12/11/2016   changed to spiriva respimat  08/05/2017  After extensive coaching HFA effectiveness =  90%    - 09/22/2017 changed to symb 80 2bid with pseudowheeze and thrush    - PFT's  03/22/2018  FEV1 1.34 (45 % ) ratio 63  p 17 % improvement from saba p sym 160/ incruse prior to study with DLCO  36/41 % corrects to 72  % for alv volume      Group D in terms of symptom/risk and laba/lama/ICS  therefore appropriate rx at this point = symb 160/incruse should be continued and approp use of saba reviewed as well   Overall needs to be much more active based on today's walking study (see separate a/p)    I had an extended discussion with the patient reviewing all relevant studies completed to date and  lasting 15 to 20 minutes of a 25 minute visit    Each maintenance medication was reviewed in detail including most importantly the difference between maintenance and prns and under what circumstances the prns are to be triggered using an action plan format that is not reflected in the computer generated alphabetically organized AVS.    Please see AVS for specific instructions unique to this visit that I personally wrote and verbalized to the the pt in detail and then reviewed with pt  by my nurse highlighting any  changes in  therapy recommended at today's visit to their plan of care.

## 2018-03-22 NOTE — Progress Notes (Signed)
PFT done today. 

## 2018-03-22 NOTE — Assessment & Plan Note (Signed)
-    HCO3 31 01/17/16 - 01/05/2015  Walked RA x 3 laps @ 185 ft each stopped due to  Leg pain and fatibue, no sob , nl pace, no desat  -  12/11/2016 Patient Saturations on Room Air at Rest = 88%----increased 98% 2lpm continuous  - HC03  24    03/02/18  C/w resolved hypercarbia - 03/22/2018  Walked RA x 3 laps @ 185 ft each stopped due to  End of study, moderate pace, no   desat  / min desat   Adequate control on present rx, reviewed in detail with pt > no change in rx needed  = 2lpm hs and with ex/ none needed at rest

## 2018-03-22 NOTE — Progress Notes (Signed)
Subjective:    Patient ID: Chris [name], male    DOB: Oct 28, 1965   MRN: [id number]    Brief patient profile:  65 yobm active smoker with HIV  and doe x 2009 much worse since March 2014 so referred by [name] to pulmonary clinic 02/15/2013 with GOLD III copd by pfts 04/28/13.     History of Present Illness  02/15/2013 1st pulmonary eval  Cc indolent onset progressive doe x walking slow pace more than 100 ft,  not at rest., 02 dep, doe much worse x 3 months assoc with congested cough esp in am with sev tbsp thick white mucus takes about 30 min to clear.  No better on inhalers tried to date including advair and spiriva - albuterol works the best and using lots of saba and nebs rec Stop advair and spiriva Plan A = Automatic = Start symbicort 160 Take 2 puffs first thing in am and then another 2 puffs about 12 hours later.  Plan B = backup= Only use your albuterol (as a rescue medication to be used if you can't catch your breath by resting or doing a relaxed purse lip breathing pattern. The less you use it, the better it will work when you need it. Ok to use up to 2 puffs every 4 hours Plan C = nebulizer, use this only if plan B doesn't work ok to use up to 4 hours     04/28/2013 f/u ov/[name] still smoking  re:  GOLD III/ 02 dep @ 2lpm  Chief Complaint  Patient presents with  . Follow-up    Pt states that his breathing has improved, symbicort seems to be helping some. No new co's today. Using proair approx 4 times per wk.   still doe but has learned to pace himself and overall better but somewhat limited from desired activities even on his best days rec Plan A = Automatic =  symbicort 160 Take 2 puffs first thing in am and then another 2 puffs about 12 hours later.  Add spiriva one capsule daily each am Plan B = backup= Only use your albuterol (proiare) as a rescue medication to be used if you can't catch your breath by resting or doing a relaxed purse lip breathing pattern. The less you use it,  the better it will work when you need it. Ok to use up to 2 puffs every 4 hours Plan C = nebulizer, use this only if plan B doesn't work after 15 min ok to use up to 4 hours   03/14/2014 Follow up and Medication review   rec Use medical med calendar        09/25/2016  Extended summary/ final f/u ov/[name] re:  GOLD  III copd/ 02 2lpm prn / symb 160 2bid/spiriva  Chief Complaint  Patient presents with  . Follow-up    Breathing seems to be improving. He is using albuterol inhaler once per wk on average.   doe = MMRC2 = can't walk a nl pace on a flat grade s sob but does fine slow and flat eg does HT shopping ok on 2lpm  Rare need for saba hfa/ hardly ever needs neb now rec Plan A = Automatic =  symbicort 160 Take 2 puffs first thing in am and then another 2 puffs about 12 hours later.  Incruse one click each am - take two drags to get it all  Plan B = Backup Only use your albuterol as a rescue medication Plan C = Crisis - only use your albuterol nebulizer if you first try Plan B The key is to stop smoking completely before smoking completely stops you!    10/14/16   NP ov Rec Stop INCRUSE  Begin Spiriva Handihaler 1 puff daily .  Continue on Symbicort Twice daily. , brush rinse and gargle after use.  Important to quit smoking  Chest xray today .  Doxycycline 161m Twice daily  For 1 week , take with food.  Zyrtec 175mAt bedtime  As needed  Drainage  Mucinex DM Twice daily As needed  Cough/congestion  Continue on O2 2l/m .     12/11/2016  f/u ov/[name] re:  COPD  GOLD III/ symb / spiriva dpiand 02 2- lpm and still smoking Chief Complaint  Patient presents with  . Follow-up    Pt states his breathing seems worse on Incruse so he has started back on spiriva b/c he had some left over. He is using albuterol inhaler 2 x daily on average and neb 3 x per wk on average.   doe = MMRC2 = can't walk a nl pace on a flat grade s sob but does fine slow and  flat eg [employer] on  RA rec Change to spiriva respimat 2 pffs each am  The key is to stop smoking completely before smoking completely stops you!  Weight control is simply a matter of calorie balance    05/26/2017  Acute  ov/[name] re:   COPD III/ symb/spiriva rare saba / 02 2pm hs then during day prn  Chief Complaint  Patient presents with  . Acute Visit    Increased DOE x 3 days.   most days gets up 6 am with sob/some congestion > symb x 2 and then spiriva and saba and maybe once a week with saba hfa / did not bring it with him  Then worse gradually x 3 days, waking up 4am not using rescue then  Not much cough-   does not bother sleep until 4 am but even then minimal vol of mucus/ white  Needing 02 2lpm 24/7 now  On PPI bid ac  rec Plan A = Automatic =  symbicort 160 Take 2 puffs first thing in am and then another 2 puffs about 12 hours later.                                       spiriva 2 puffs  Work on inhaler technique:   Plan B = Backup Only use your albuterol as a rescue medication Plan C = Crisis - only use your albuterol nebulizer if you first try Plan B The key is to stop smoking completely before smoking completely stops you!  Prednisone 10 mg take  4 each am x 2 days,   2 each am x 2 days,  1 each am x 2 days and stop  Please schedule a follow up visit in 3 months but call sooner if needed    Admit date: 07/21/2017 Discharge date: 07/28/2017  Time spent: 35 minutes  Recommendations for Outpatient Follow-up:  1. Repeat basic metabolic panel to follow electrolytes and renal function 2. Patient needs outpatient sleep study to sit up and determine candidacy for CPAP. 3. Reassess volume  status and blood pressure, adjust medications as needed. 4. Close follow-up to patient's CBG and A1c; further adjust hypoglycemic regimen as indicated.  Discharge Diagnoses:  Active Problems:   HIV disease ([place])   COPD  GOLD III, still smoking   Steroid-induced hyperglycemia    Chronic respiratory failure with hypoxia and hypercapnia ([facility])   DM (diabetes mellitus) (H[facility])   COPD exacerbation (H[facility])   Obstructive sleep apnea syndrome   Essential hypertension, benign   Atopic eczema   Type 2 diabetes mellitus with complication (HCC)   Morbid (severe) obesity due to excess calories ([place])   COPD with acute exacerbation (HCC)   Depression   GERD (gastroesophageal reflux disease)   Acute on chronic respiratory failure with hypoxia ([place])    08/05/2017  Post hosp  f/u ov/[name] re:  p copd exac - did not follow action plan reviewed at last ov prior to ER Chief Complaint  Patient presents with  . Follow-up    hospital f/u- [facility], D/C last week with COPD, using 3L [place], still has some SOB  with 02    -  MMRC2 = can't walk a nl pace on a flat grade s sob but does fine slow and flat  Using 02 as needed daytime and 2lpm hs  rec Work on inhaler technique:   Plan A = Automatic =  symbicort 160 Take 2 puffs first thing in am and then another 2 puffs about 12 hours later. spiriva 2 puffs in am only  Plan B = Backup Only use your albuterol as a rescue medication  Plan C = Crisis - only use your albuterol nebulizer if you first try Plan B and it fails to help > ok to use the nebulizer up to every 4 hours but if start needing it regularly call for immediate appointment The key is to stop smoking completely before smoking completely stops you!  Keep your follow up appt but bring all inhalers and nebulizer solutions with you to this visit    09/22/2017  f/u ov/[name] re: still smoking/ did not bring meds as rec/ saba hfa maybe twice  A week  On maint symb 160/spiriva  Chief Complaint  Patient presents with  . Follow-up    Pt c/o chest tightness on the left off and on for the past 3 wks.   Dyspnea:  Better than usual able to do [employer] slow pace = MMRC2 = can't walk a nl pace on a flat grade s sob but does fine slow and flat  Cough: minimal/ mostly am mucoicd Sleep: on 2lpm on side  and 2 pillows ok  rec The key is to stop smoking completely before smoking completely stops you!  Ok to try symb 80 Take 2 puffs first thing in am and then another 2 puffs about 12 hours later to see if breathing is as good For thrush> clotrimazole troche 10 mg up to 4 x daily as needed     03/22/2018  f/u ov/[name] re:  GOLD III/ 02 dep 2lpm hs/ex and still smoking  Chief Complaint  Patient presents with  . Follow-up    PFT's done. Breathing is doing well today. He is using his albuterol inhaler once daily on average.   Dyspnea:  [employer] barely MMRC3 = can't walk 100 yards even at a slow pace at a flat grade s stopping due to sob   Cough: no cough Sleeping: on 1 pillow folded over  SABA use: once daily  02: 2lpm  At  hs and exertion    No obvious day to day or daytime variability or assoc excess/ purulent sputum or mucus plugs or hemoptysis or cp or chest tightness, subjective wheeze or overt sinus or hb symptoms.   Sleeping as above  without nocturnal  or early am exacerbation  of respiratory  c/o's or need for noct saba. Also denies any obvious fluctuation of symptoms with weather or environmental changes or other aggravating or alleviating factors except as outlined above   No unusual exposure hx or h/o childhood pna/ asthma or knowledge of premature birth.  Current Allergies, Complete Past Medical History, Past Surgical History, Family History, and Social History were reviewed in [employer] record.  ROS  The following are not active complaints unless bolded Hoarseness, sore throat, dysphagia, dental problems, itching, sneezing,  nasal congestion or discharge of excess mucus or purulent secretions, ear ache,   fever, chills, sweats, unintended wt loss or wt gain, classically pleuritic or exertional cp,  orthopnea pnd or arm/hand swelling  or leg swelling, presyncope, palpitations, abdominal pain, anorexia, nausea, vomiting, diarrhea  or change in bowel habits or  change in bladder habits, change in stools or change in urine, dysuria, hematuria,  rash, arthralgias, visual complaints, headache, numbness, weakness or ataxia or problems with walking or coordination,  change in mood or  memory.        Current Meds  Medication Sig  . acetaminophen (TYLENOL) 500 MG tablet Take 1,000 mg by mouth every 6 (six) hours as needed for headache (pain).  [name] albuterol (PROVENTIL HFA;VENTOLIN HFA) 108 (90 Base) MCG/ACT inhaler Inhale 2 puffs into the lungs every 6 (six) hours as needed for wheezing or shortness of breath.  [name] amiodarone (PACERONE) 200 MG tablet Take 200 mg by mouth daily.  [name] amitriptyline (ELAVIL) 100 MG tablet Take 1 tablet (100 mg total) by mouth at bedtime.  [name] apixaban (ELIQUIS) 5 MG TABS tablet Take 1 tablet (5 mg total) by mouth 2 (two) times daily.  [name] BIKTARVY 50-200-25 MG TABS tablet TAKE 1 TABLET BY MOUTH EVERY DAY  . Blood Glucose Monitoring Suppl (ONETOUCH VERIO) w/Device KIT 1 kit by Does not apply route 2 (two) times daily.  . budesonide-formoterol (SYMBICORT) 160-4.5 MCG/ACT inhaler Inhale 2 puffs into the lungs 2 (two) times daily.  . dapsone 100 MG tablet Take 1 tablet (100 mg total) by mouth daily with breakfast.  . diltiazem (CARDIZEM CD) 240 MG 24 hr capsule Take 1 capsule (240 mg total) by mouth daily.  [name] docusate sodium (COLACE) 100 MG capsule Take 100 mg by mouth daily as needed for mild constipation.  . furosemide (LASIX) 40 MG tablet Take 40 mg by mouth 2 (two) times daily.  [name] gabapentin (NEURONTIN) 300 MG capsule Take 2 capsules (600 mg total) by mouth 3 (three) times daily.  [name] glucose blood (ONETOUCH VERIO) test strip Use as instructed  . hydrALAZINE (APRESOLINE) 50 MG tablet Take 1 tablet (50 mg total) by mouth every 8 (eight) hours.  . Insulin Detemir (LEVEMIR) 100 UNIT/ML Pen Inject 30 Units into the skin daily at 10 pm.  . Insulin Pen Needle (B-D UF III MINI PEN NEEDLES) 31G X 5 MM MISC Use as instructed  . isosorbide  mononitrate (IMDUR) 30 MG 24 hr tablet Take 1 tablet (30 mg total) by mouth daily.  . metFORMIN (GLUCOPHAGE) 1000 MG tablet Take 1 tablet (1,000 mg total) by mouth daily with breakfast. (Patient taking differently: Take 1,000 mg by mouth 2 (two)  times daily with a meal. )  . Misc. Devices MISC Please provide patient with a portable oximeter. Diagnosis: COPD GOLD III  . ONETOUCH DELICA LANCETS 25Z MISC Use as instructed  . oxyCODONE (OXY IR/ROXICODONE) 5 MG immediate release tablet Take 1 tablet (5 mg total) by mouth 3 (three) times daily as needed for up to 15 doses for moderate pain or severe pain (pain).  . OXYGEN Inhale 2 L into the lungs as needed (shortness of breath).  . potassium chloride SA (K-DUR,KLOR-CON) 20 MEQ tablet Take 20 mEq by mouth daily.  [name] senna (SENOKOT) 8.6 MG TABS tablet Take 1 tablet by mouth 2 (two) times daily as needed for mild constipation.  [name] umeclidinium bromide (INCRUSE ELLIPTA) 62.5 MCG/INH AEPB Inhale 1 puff into the lungs daily.                          Objective:   Physical Exam   Amb obese bm nad    Vital signs reviewed - Note on arrival 02 sats  97% on 2lpm      01/05/2015       278 >  02/29/2016  274 > 06/17/2016 277 > 09/25/2016   258  > 12/11/2016   270  > 05/26/2017  270 > 08/05/2017  255  > 09/22/2017   262 > 03/22/2018   244  01/28/2016        272   05/16/14 285 lb 6.4 oz (129.457 kg)  04/20/14 282 lb 12.8 oz (128.277 kg)  03/14/14 274 lb 3.2 oz (124.376 kg)       HEENT: nl dentition   Nl external ear canals without cough reflex - mild bilateral non-specific turbinate edema / min thrush     NECK :  without JVD/Nodes/TM/ nl carotid upstrokes bilaterally   LUNGS: no acc muscle use,  Mild barrel  contour chest wall with bilateral  Distant bs s audible wheeze and  without cough on insp or exp maneuver and mild   Hyperresonant  to  percussion bilaterally     CV:  RRR  no s3 or murmur or increase in P2, and no edema   ABD:  Obese/soft and  nontender with pos mid insp Hoover's  in the supine position. No bruits or organomegaly appreciated, bowel sounds nl  MS:   Nl gait/  ext warm without deformities, calf tenderness, cyanosis or clubbing No obvious joint restrictions   SKIN: warm and dry without lesions    NEURO:  alert, approp, nl sensorium with  no motor or cerebellar deficits apparent.            Assessment & Plan:

## 2018-03-22 NOTE — Patient Instructions (Signed)
No change medications    Only use your albuterol as a rescue medication to be used if you can't catch your breath by resting or doing a relaxed purse lip breathing pattern.  - The less you use it, the better it will work when you need it. - Ok to use up to 2 puffs  every 4 hours if you must but call for immediate appointment if use goes up over your usual need - Don't leave home without it !!  (think of it like the spare tire for your car)    Please schedule a follow up visit in  6  months but call sooner if needed

## 2018-03-22 NOTE — Assessment & Plan Note (Signed)
4-5 min discussion re active cigarette smoking in addition to office E&M  Ask about tobacco use:   Ongoing but down to 4 per day Advise quitting   I took an extended  opportunity with this patient to outline the consequences of continued cigarette use  in airway disorders based on all the data we have from the multiple national lung health studies (perfomed over decades at millions of dollars in cost)  indicating that smoking cessation, not choice of inhalers or physicians, is the most important aspect of care.   Assess willingness:  Not committed at this point to completely quitting Assist in quit attempt:  Per PCP when ready Arrange follow up:   Follow up per Primary Care planned

## 2018-03-23 ENCOUNTER — Ambulatory Visit: Payer: Self-pay | Admitting: Internal Medicine
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
## 2018-03-24 ENCOUNTER — Ambulatory Visit: Payer: Medicare HMO | Admitting: Adult Health

## 2018-03-25 ENCOUNTER — Encounter: Payer: Self-pay | Admitting: *Deleted

## 2018-03-26 ENCOUNTER — Telehealth: Payer: Self-pay | Admitting: *Deleted

## 2018-03-26 NOTE — Telephone Encounter (Signed)
Patient called to ask if we could give him a Rx of Viagra. Advised we have not given him that since 2016 so I will have to ask the doctor before I can fill it.

## 2018-03-26 NOTE — Telephone Encounter (Signed)
Can you have him talk to his pulmonologist if ok for viagra.

## 2018-03-29 ENCOUNTER — Telehealth: Payer: Self-pay

## 2018-03-29 ENCOUNTER — Other Ambulatory Visit: Payer: Self-pay | Admitting: Internal Medicine

## 2018-03-29 ENCOUNTER — Telehealth: Payer: Self-pay | Admitting: Internal Medicine

## 2018-03-29 NOTE — Telephone Encounter (Signed)
Spoke with pt, states that Dr. Drue SecondSnider is requesting clearance from MW to prescribe pt Viagra prn.  I advised pt that this clearance should come from his PCP, but pt states that Dr. Drue SecondSnider specifically is requesting pulmonary clearance for this.  Per Dr. Feliz BeamSnider's chart notes she is in fact requesting pulmonary clearance for this medication.  Pt is very anxious to receive this clearance.  MW please advise.  Thanks.

## 2018-03-29 NOTE — Telephone Encounter (Signed)
Patient walked into clinic for refill of Viagra .  I explained to patient Dr Drue SecondSnider has asked for clearance from his pulmonary specialist prior to refilling medication.   Patient became very emotional and "stated he has lost everything and not having an erection makes him not want to live".   I explained the importance making sure his body is healthy enough to tolerate this medication which could be fatal.   Laurell Josephsammy K Jiles Goya, RN

## 2018-03-29 NOTE — Telephone Encounter (Signed)
Attempted to call patient, had busy signal X2. Will try again.

## 2018-03-29 NOTE — Telephone Encounter (Signed)
Pt on imdur can't use viagra or any similar med - could see urology for alternatives and happy to refer him prn

## 2018-03-30 NOTE — Telephone Encounter (Signed)
Pt is returning call. Cb is [phone].

## 2018-03-30 NOTE — Telephone Encounter (Signed)
Left message for patient to call back  

## 2018-03-30 NOTE — Telephone Encounter (Signed)
Patient in yesterday to check on his Viagra Rx and informed him per Dr Drue SecondSnider to check with his pulmonologist  about fitness for the medication. He advised he dose not understand why and became very upset stating "This is his life and without it he does not want to live if he cant work his wife will leave him." advised him we ask him to see his pulmonologist  for his overall health and if they say it is ok maybe we can revisit the issue. He was not ok with no and asked to see the provider. She is away from the office so made him an appointment to see her tomorrow 03/31/18 at 11 am

## 2018-03-30 NOTE — Telephone Encounter (Signed)
Spoke with patient. He stated that he is not taking Imdur despite it being on his medication list.   MW, please advise. Thanks!

## 2018-03-30 NOTE — Telephone Encounter (Signed)
I can't prescribe viagra for a pt who is supposed to be taking nitro as there's a reason the nitro was recommended in the first place  and viagra use could cause heart problems that would require ntiro prn (which could be disastrous)   so best to discuss both these issues with his pcp/cardiologist and leave me out of the discussion as it not a pulmonary issue

## 2018-03-31 ENCOUNTER — Ambulatory Visit (INDEPENDENT_AMBULATORY_CARE_PROVIDER_SITE_OTHER): Payer: Medicare HMO | Admitting: Internal Medicine

## 2018-03-31 ENCOUNTER — Encounter: Payer: Self-pay | Admitting: Internal Medicine

## 2018-03-31 VITALS — BP 134/73 | HR 93 | Temp 98.1°F | Wt 248.0 lb

## 2018-03-31 DIAGNOSIS — R69 Illness, unspecified: Secondary | ICD-10-CM | POA: Diagnosis not present

## 2018-03-31 DIAGNOSIS — B20 Human immunodeficiency virus [HIV] disease: Secondary | ICD-10-CM

## 2018-03-31 DIAGNOSIS — I480 Paroxysmal atrial fibrillation: Secondary | ICD-10-CM | POA: Diagnosis not present

## 2018-03-31 DIAGNOSIS — N521 Erectile dysfunction due to diseases classified elsewhere: Secondary | ICD-10-CM

## 2018-03-31 NOTE — Telephone Encounter (Signed)
ATC pt, no answer. Left message for pt to call back.  

## 2018-03-31 NOTE — Progress Notes (Signed)
RFV: follow up for hiv disease  Patient ID: Chris Round., male   DOB: [birth year], 52 y.o.   MRN: [id number]  HPI Chris [name] a 52yo M with HIV disease, severe COPD, 330/VL<20 in July 2019. He is in the clinic today to discuss getting refill for medications for erectile dysfunction. Chris [name] has had a some significant hospitalizations in the last year with protracted stays requiring intubation/s/p trach and decannulation. He is currently on imdur as part of his HTN management which we have pointed out that this has drug interaction with viagra. He is tearful that this will interfere with his relationship with his wife.  Outpatient Encounter Medications as of 03/31/2018  Medication Sig  . albuterol (PROVENTIL HFA;VENTOLIN HFA) 108 (90 Base) MCG/ACT inhaler Inhale 2 puffs into the lungs every 6 (six) hours as needed for wheezing or shortness of breath.  [name] amiodarone (PACERONE) 200 MG tablet Take 200 mg by mouth daily.  [name] apixaban (ELIQUIS) 5 MG TABS tablet Take 1 tablet (5 mg total) by mouth 2 (two) times daily.  [name] BIKTARVY 50-200-25 MG TABS tablet TAKE 1 TABLET BY MOUTH EVERY DAY  . Blood Glucose Monitoring Suppl (ONETOUCH VERIO) w/Device KIT 1 kit by Does not apply route 2 (two) times daily.  . budesonide-formoterol (SYMBICORT) 160-4.5 MCG/ACT inhaler Inhale 2 puffs into the lungs 2 (two) times daily.  [name] gabapentin (NEURONTIN) 300 MG capsule Take 2 capsules (600 mg total) by mouth 3 (three) times daily.  [name] glucose blood (ONETOUCH VERIO) test strip Use as instructed  . hydrALAZINE (APRESOLINE) 50 MG tablet Take 1 tablet (50 mg total) by mouth every 8 (eight) hours.  . Insulin Pen Needle (B-D UF III MINI PEN NEEDLES) 31G X 5 MM MISC Use as instructed  . isosorbide mononitrate (IMDUR) 30 MG 24 hr tablet Take 1 tablet (30 mg total) by mouth daily.  . metFORMIN (GLUCOPHAGE) 1000 MG tablet Take 1 tablet (1,000 mg total) by mouth daily with breakfast. (Patient taking differently: Take 1,000 mg by  mouth 2 (two) times daily with a meal. )  . Misc. Devices MISC Please provide patient with a portable oximeter. Diagnosis: COPD GOLD III  . ONETOUCH DELICA LANCETS 39Q MISC Use as instructed  . oxyCODONE (OXY IR/ROXICODONE) 5 MG immediate release tablet Take 1 tablet (5 mg total) by mouth 3 (three) times daily as needed for up to 15 doses for moderate pain or severe pain (pain).  . OXYGEN Inhale 2 L into the lungs as needed (shortness of breath).  . senna (SENOKOT) 8.6 MG TABS tablet Take 1 tablet by mouth 2 (two) times daily as needed for mild constipation.  [name] umeclidinium bromide (INCRUSE ELLIPTA) 62.5 MCG/INH AEPB Inhale 1 puff into the lungs daily.  . [DISCONTINUED] acetaminophen (TYLENOL) 500 MG tablet Take 1,000 mg by mouth every 6 (six) hours as needed for headache (pain).  [name] amitriptyline (ELAVIL) 100 MG tablet Take 1 tablet (100 mg total) by mouth at bedtime. (Patient not taking: Reported on 03/31/2018)  . dapsone 100 MG tablet Take 1 tablet (100 mg total) by mouth daily with breakfast. (Patient not taking: Reported on 03/31/2018)  . diltiazem (CARDIZEM CD) 240 MG 24 hr capsule Take 1 capsule (240 mg total) by mouth daily. (Patient not taking: Reported on 03/31/2018)  . docusate sodium (COLACE) 100 MG capsule Take 100 mg by mouth daily as needed for mild constipation.  . furosemide (LASIX) 40 MG tablet Take 40 mg by mouth 2 (two) times daily.  [name]  potassium chloride SA (K-DUR,KLOR-CON) 20 MEQ tablet Take 20 mEq by mouth daily.  . [DISCONTINUED] Insulin Detemir (LEVEMIR) 100 UNIT/ML Pen Inject 30 Units into the skin daily at 10 pm. (Patient not taking: Reported on 03/31/2018)  . [DISCONTINUED] insulin glargine (LANTUS) 100 UNIT/ML injection Inject 0.2 mLs (20 Units total) into the skin 2 (two) times daily. (Patient taking differently: Inject 30 Units into the skin daily. )  . [DISCONTINUED] mometasone-formoterol (DULERA) 200-5 MCG/ACT AERO Inhale 2 puffs into the lungs 2 (two) times daily.   No  facility-administered encounter medications on file as of 03/31/2018.      Patient Active Problem List   Diagnosis Date Noted  . Acute and chronic respiratory failure with hypoxia  01/21/2018  . Left lower lobe pneumonia/HCAP 01/21/2018  . Human immunodeficiency virus (HIV) disease /CD4 60 May 2019 01/21/2018  . COPD (chronic obstructive pulmonary disease) ([place]) 01/21/2018  . Diabetes mellitus type 2, insulin dependent ([place]) 01/21/2018  . Hypertension/chronic diastolic heart failure 76/16/0737  . Paroxysmal atrial fibrillation ([place]) 01/21/2018  . Physical deconditioning 01/21/2018  . HCAP (healthcare-associated pneumonia) 01/14/2018  . Esophageal candidiasis ([place])   . Dysphasia   . Anemia   . Evaluation by psychiatric service required   . Goals of care, counseling/discussion   . Elevated troponin   . AKI (acute kidney injury) ([place])   . Endotracheally intubated   . Diabetes mellitus type 2 in obese ([place]) 10/01/2017  . Atrial fibrillation ([place]) 10/01/2017  . HIV (human immunodeficiency virus infection) ([place]) 10/01/2017  . Chronic diastolic heart failure ([place])- exacerbated by Afib. 09/25/2017  . PAF (paroxysmal atrial fibrillation) ([place]) 09/25/2017  . Chest pain with moderate risk for cardiac etiology 09/25/2017  . Depression 07/21/2017  . GERD (gastroesophageal reflux disease) 07/21/2017  . Acute on chronic respiratory failure with hypoxia ([place]) 07/21/2017  . Dental caries 06/01/2017  . Special screening for malignant neoplasms, colon   . Dysphagia   . Candida esophagitis ([place])   . Candidiasis of mouth 03/27/2016  . Adjustment disorder with depressed mood 01/04/2016  . Callus of foot 01/04/2016  . Hoarseness of voice 10/04/2015  . Cigarette nicotine dependence without complication 10/62/6948  . Primary osteoarthritis of right knee 03/26/2015  . Genital warts 01/22/2015  . Other male erectile dysfunction 01/22/2015  . Essential hypertension, benign 12/04/2014  . Atopic eczema  12/04/2014  . COPD exacerbation ([place]) 08/23/2013  . Cyst (solitary) of breast 08/23/2013  . Obstructive sleep apnea syndrome 08/23/2013  . Steroid-induced hyperglycemia 01/12/2013  . Chronic respiratory failure with hypoxia and hypercapnia ([place]) 01/12/2013  . COPD  GOLD III, still smoking 01/11/2013  . HIV disease ([place]) 12/06/2012     Health Maintenance Due  Topic Date Due  . FOOT EXAM  04/12/1976  . OPHTHALMOLOGY EXAM  04/12/1976  . TETANUS/TDAP  04/12/1985  . INFLUENZA VACCINE  03/11/2018     Review of Systems Shortness of breath is improving, only 2 L [place] in daytime. Physical Exam   BP 134/73   Pulse 93   Temp 98.1 F (36.7 C)   Wt 248 lb (112.5 kg)   BMI 38.84 kg/m   Did not examine Lab Results  Component Value Date   CD4TCELL 10 (L) 03/03/2018   Lab Results  Component Value Date   CD4TABS 330 (L) 03/03/2018   CD4TABS 210 (L) 01/22/2018   CD4TABS 60 (L) 12/15/2017   Lab Results  Component Value Date   HIV1RNAQUANT <20 NOT DETECTED 03/03/2018   Lab Results  Component Value  Date   HEPBSAB Reactive 10/11/2017   Lab Results  Component Value Date   LABRPR NON REAC 06/23/2016    CBC Lab Results  Component Value Date   WBC 10.3 03/03/2018   RBC 3.96 (L) 03/03/2018   HGB 11.1 (L) 03/03/2018   HCT 34.2 (L) 03/03/2018   PLT 290 03/03/2018   MCV 86.4 03/03/2018   MCH 28.0 03/03/2018   MCHC 32.5 03/03/2018   RDW 13.0 03/03/2018   LYMPHSABS 3,502 03/03/2018   MONOABS 1.6 (H) 01/21/2018   EOSABS 258 03/03/2018    BMET Lab Results  Component Value Date   NA 139 03/02/2018   K 3.8 03/02/2018   CL 100 03/02/2018   CO2 24 03/02/2018   GLUCOSE 100 (H) 03/02/2018   BUN 14 03/02/2018   CREATININE 1.22 03/02/2018   CALCIUM 9.5 03/02/2018   GFRNONAA 68 03/02/2018   GFRAA 79 03/02/2018      Assessment and Plan  Will need for him to see his cardiologist to see if can figure out better regimen (sparing imdur/or nitro based medication) so that can  discuss if he can take viagra or other medications for erectile dysfunction  Erectile dysfunction = unable to give him viagra due to drug interaction with imdur.   hiv disease = well controlled  Health maintenance - he will need meningococcal booster and pneumovax 23 - in October. Also needs flu in next month

## 2018-03-31 NOTE — Telephone Encounter (Signed)
Patient returned phone call; pt contact # [phone]

## 2018-04-01 NOTE — Telephone Encounter (Signed)
lmtcb for pt.  

## 2018-04-05 NOTE — Telephone Encounter (Signed)
LMTCB

## 2018-04-05 NOTE — Telephone Encounter (Signed)
ATC patient, patient did not answer. Will close this encounter since the patient has been contacted 3 times.

## 2018-04-05 NOTE — Telephone Encounter (Signed)
Patient returned phone call ; pt contact # [phone]

## 2018-04-07 DIAGNOSIS — G8921 Chronic pain due to trauma: Secondary | ICD-10-CM | POA: Diagnosis not present

## 2018-04-07 DIAGNOSIS — G894 Chronic pain syndrome: Secondary | ICD-10-CM | POA: Diagnosis not present

## 2018-04-07 DIAGNOSIS — Z79899 Other long term (current) drug therapy: Secondary | ICD-10-CM | POA: Diagnosis not present

## 2018-04-07 DIAGNOSIS — R69 Illness, unspecified: Secondary | ICD-10-CM | POA: Diagnosis not present

## 2018-04-16 DIAGNOSIS — J129 Viral pneumonia, unspecified: Secondary | ICD-10-CM | POA: Diagnosis not present

## 2018-04-16 DIAGNOSIS — J449 Chronic obstructive pulmonary disease, unspecified: Secondary | ICD-10-CM | POA: Diagnosis not present
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
## 2018-04-20 ENCOUNTER — Telehealth: Payer: Self-pay | Admitting: *Deleted

## 2018-04-20 ENCOUNTER — Ambulatory Visit (INDEPENDENT_AMBULATORY_CARE_PROVIDER_SITE_OTHER): Payer: Medicare HMO | Admitting: Adult Health

## 2018-04-20 ENCOUNTER — Encounter: Payer: Self-pay | Admitting: Adult Health

## 2018-04-20 VITALS — BP 122/70 | HR 99 | Ht 69.0 in | Wt 252.0 lb

## 2018-04-20 DIAGNOSIS — R079 Chest pain, unspecified: Secondary | ICD-10-CM | POA: Diagnosis not present

## 2018-04-20 DIAGNOSIS — I509 Heart failure, unspecified: Secondary | ICD-10-CM

## 2018-04-20 DIAGNOSIS — I10 Essential (primary) hypertension: Secondary | ICD-10-CM

## 2018-04-20 MED ORDER — HYDRALAZINE HCL 50 MG PO TABS: 75.0000 mg | ORAL_TABLET | Freq: Three times a day (TID) | ORAL | 1 refills | Status: DC

## 2018-04-20 NOTE — Patient Instructions (Addendum)
Medication Instructions:   STOP isosorbide mononitrate  INCREASE hydralazine to 75mg  three times daily -- an updated prescription has been sent to the pharmacy   Labwork:  NONE  Testing/Procedures:  NONE  Follow-Up:  Chris Deist NP recommends that you schedule a follow-up appointment with clinical pharmacy in about 1 week for a blood pressure check appointment.   It is recommended that you schedule a follow-up appointment in 3 months with Dr. Herbie [place].     If you need a refill on your cardiac medications before your next appointment, please call your pharmacy.  Any Other Special Instructions Will Be Listed Below (If Applicable).

## 2018-04-20 NOTE — Telephone Encounter (Signed)
Patient came by clinic today to report that he has seen his cardiologist and wants a Rx for Viagra. Advised will let the provider know and once she speaks with or talks to the Cardiologist she will make a decision about the prescription. The patient was aggravated stating he wants his Rx now like he was told and asked if he should wait in the office for the Rx. Advised Dr Drue Second is out of the office and she will need to review the note them make a decision as to his physical fitness for the requested medication. He was upset but advised he will wait for my call.

## 2018-04-21 ENCOUNTER — Other Ambulatory Visit: Payer: Self-pay | Admitting: Internal Medicine

## 2018-04-21 MED ORDER — SILDENAFIL CITRATE 25 MG PO TABS: 25.0000 mg | ORAL_TABLET | Freq: Every day | ORAL | 0 refills | Status: DC | PRN

## 2018-04-21 NOTE — Telephone Encounter (Signed)
Per instructions from Dr Drue Second called the patient to advise that the Viagra has been [facility] [address] and is 25 mg x10 and to only take 1 tablet  And that is all for the month and no refills. He will need to call the office with any problems and he will need to keep follow up with Cardio and Pulmonary providers. Also we do not provide PA for this medication and he will need to go online and print out a coupon if he can not afford the Rx.   Had to leave a message for him to call the office back. Will advise once he does.

## 2018-04-21 NOTE — Telephone Encounter (Signed)
Patient called again today to see if Dr. Drue Second reviewed notes from his Cardiologist so he can get his Viagra prescription.  Informed patient that He saw his Cardiologist yesterday and has to give Dr. Drue Second time to review.  Informed him we would contact him once we are given an answer. Angeline Slim RN

## 2018-04-24 DIAGNOSIS — I10 Essential (primary) hypertension: Secondary | ICD-10-CM | POA: Diagnosis not present

## 2018-04-24 DIAGNOSIS — J9621 Acute and chronic respiratory failure with hypoxia: Secondary | ICD-10-CM | POA: Diagnosis not present

## 2018-04-24 DIAGNOSIS — R062 Wheezing: Secondary | ICD-10-CM | POA: Diagnosis not present

## 2018-04-24 DIAGNOSIS — J189 Pneumonia, unspecified organism: Secondary | ICD-10-CM | POA: Diagnosis not present

## 2018-04-24 DIAGNOSIS — J440 Chronic obstructive pulmonary disease with acute lower respiratory infection: Secondary | ICD-10-CM | POA: Diagnosis not present

## 2018-04-24 DIAGNOSIS — I4891 Unspecified atrial fibrillation: Secondary | ICD-10-CM | POA: Diagnosis not present

## 2018-04-24 DIAGNOSIS — J441 Chronic obstructive pulmonary disease with (acute) exacerbation: Secondary | ICD-10-CM | POA: Diagnosis not present

## 2018-04-24 DIAGNOSIS — J181 Lobar pneumonia, unspecified organism: Secondary | ICD-10-CM | POA: Diagnosis not present

## 2018-04-24 DIAGNOSIS — J9622 Acute and chronic respiratory failure with hypercapnia: Secondary | ICD-10-CM | POA: Diagnosis not present

## 2018-04-24 DIAGNOSIS — A419 Sepsis, unspecified organism: Secondary | ICD-10-CM | POA: Diagnosis not present

## 2018-04-24 DIAGNOSIS — R069 Unspecified abnormalities of breathing: Secondary | ICD-10-CM | POA: Diagnosis not present

## 2018-04-24 DIAGNOSIS — R0602 Shortness of breath: Secondary | ICD-10-CM | POA: Diagnosis not present

## 2018-04-24 DIAGNOSIS — Z9981 Dependence on supplemental oxygen: Secondary | ICD-10-CM | POA: Diagnosis not present

## 2018-04-24 DIAGNOSIS — R000 Tachycardia, unspecified: Secondary | ICD-10-CM | POA: Diagnosis not present

## 2018-04-27 ENCOUNTER — Ambulatory Visit: Payer: Medicare HMO

## 2018-04-28 ENCOUNTER — Telehealth: Payer: Self-pay | Admitting: Internal Medicine

## 2018-04-28 NOTE — Telephone Encounter (Signed)
Patient states he is using his Symbicort for SOB 4+ times a day  in additon to regular dose.and he is unable to get a refill at this time. I have [facility] and they have advised me he is not due for a refill on his Symbicort 160 until 05/27/18. I advised him that the Symbicort is not a rescue inhaler and he should be using his albuterol inhaler for SOB. I have made a appointment for the patient tomorrow with MW and asked him to bring all of his medication.

## 2018-04-29 ENCOUNTER — Encounter: Payer: Self-pay | Admitting: Internal Medicine

## 2018-04-29 ENCOUNTER — Ambulatory Visit (INDEPENDENT_AMBULATORY_CARE_PROVIDER_SITE_OTHER): Payer: Medicare HMO | Admitting: Internal Medicine

## 2018-04-29 VITALS — BP 142/62 | HR 120 | Ht 69.0 in | Wt 258.0 lb

## 2018-04-29 DIAGNOSIS — J9612 Chronic respiratory failure with hypercapnia: Secondary | ICD-10-CM

## 2018-04-29 DIAGNOSIS — J9611 Chronic respiratory failure with hypoxia: Secondary | ICD-10-CM

## 2018-04-29 DIAGNOSIS — J449 Chronic obstructive pulmonary disease, unspecified: Secondary | ICD-10-CM | POA: Diagnosis not present

## 2018-04-29 DIAGNOSIS — F17210 Nicotine dependence, cigarettes, uncomplicated: Secondary | ICD-10-CM

## 2018-04-29 DIAGNOSIS — R69 Illness, unspecified: Secondary | ICD-10-CM | POA: Diagnosis not present

## 2018-04-29 MED ORDER — BUDESONIDE-FORMOTEROL FUMARATE 160-4.5 MCG/ACT IN AERO: 2.0000 | INHALATION_SPRAY | Freq: Two times a day (BID) | RESPIRATORY_TRACT | 1 refills | Status: DC

## 2018-04-29 MED ORDER — ALBUTEROL SULFATE HFA 108 (90 BASE) MCG/ACT IN AERS: 2.0000 | INHALATION_SPRAY | Freq: Four times a day (QID) | RESPIRATORY_TRACT | 2 refills | Status: DC | PRN

## 2018-04-29 NOTE — Patient Instructions (Signed)
No change in medications   The key is to stop smoking completely before smoking completely stops you!   Please schedule a follow up office visit in 2  weeks, sooner if needed with cxr on return

## 2018-04-29 NOTE — Progress Notes (Signed)
Subjective:    Patient ID: Chris [name], male    DOB: 01/29/1966   MRN: [id number]    Brief patient profile:  52 yobm active smoker with HIV  and doe x 2009 much worse since March 2014 so referred by [name] to pulmonary clinic 02/15/2013 with GOLD III copd by pfts 04/28/13.     History of Present Illness  02/15/2013 1st pulmonary eval  Cc indolent onset progressive doe x walking slow pace more than 100 ft,  not at rest., 02 dep, doe much worse x 3 months assoc with congested cough esp in am with sev tbsp thick white mucus takes about 30 min to clear.  No better on inhalers tried to date including advair and spiriva - albuterol works the best and using lots of saba and nebs rec Stop advair and spiriva Plan A = Automatic = Start symbicort 160 Take 2 puffs first thing in am and then another 2 puffs about 12 hours later.  Plan B = backup= Only use your albuterol (as a rescue medication to be used if you can't catch your breath by resting or doing a relaxed purse lip breathing pattern. The less you use it, the better it will work when you need it. Ok to use up to 2 puffs every 4 hours Plan C = nebulizer, use this only if plan B doesn't work ok to use up to 4 hours       05/26/2017  Acute  ov/ re:   COPD III/ symb/spiriva rare saba / 02 2pm hs then during day prn  Chief Complaint  Patient presents with  . Acute Visit    Increased DOE x 3 days.   most days gets up 6 am with sob/some congestion > symb x 2 and then spiriva and saba and maybe once a week with saba hfa / did not bring it with him  Then worse gradually x 3 days, waking up 4am not using rescue then  Not much cough-   does not bother sleep until 4 am but even then minimal vol of mucus/ white  Needing 02 2lpm 24/7 now  On PPI bid ac  rec Plan A = Automatic =  symbicort 160 Take 2 puffs first thing in am and then another 2 puffs about 12 hours later.                                       spiriva 2 puffs  Work on inhaler  technique:   Plan B = Backup Only use your albuterol as a rescue medication Plan C = Crisis - only use your albuterol nebulizer if you first try Plan B The key is to stop smoking completely before smoking completely stops you!  Prednisone 10 mg take  4 each am x 2 days,   2 each am x 2 days,  1 each am x 2 days and stop  Please schedule a follow up visit in 3 months but call sooner if needed    Admit date: 07/21/2017 Discharge date: 07/28/2017  Time spent: 35 minutes  Recommendations for Outpatient Follow-up:  1. Repeat basic metabolic panel to follow electrolytes and renal function 2. Patient needs outpatient sleep study to sit up and determine candidacy for CPAP. 3. Reassess volume status and blood pressure, adjust medications as needed. 4. Close follow-up to patient's CBG and A1c; further adjust hypoglycemic regimen as indicated.    Discharge Diagnoses:  Active Problems:   HIV disease ([facility])   COPD  GOLD III, still smoking   Steroid-induced hyperglycemia   Chronic respiratory failure with hypoxia and hypercapnia ([facility])   DM (diabetes mellitus) ([facility])   COPD exacerbation ([facility])   Obstructive sleep apnea syndrome   Essential hypertension, benign   Atopic eczema   Type 2 diabetes mellitus with complication ([facility])   Morbid (severe) obesity due to excess calories ([facility])   COPD with acute exacerbation ([facility])   Depression   GERD (gastroesophageal reflux disease)   Acute on chronic respiratory failure with hypoxia ([facility])    08/05/2017  Post hosp  f/u ov/ re:  p copd exac - did not follow action plan reviewed at last ov prior to ER Chief Complaint  Patient presents with  . Follow-up    hospital f/u- [facility], D/C last week with COPD, using 3L [place], still has some SOB  with 02    -  MMRC2 = can't walk a nl pace on a flat grade s sob but does fine slow and flat  Using 02 as needed daytime and 2lpm hs  rec Work on inhaler technique:   Plan A = Automatic =  symbicort 160 Take 2 puffs  first thing in am and then another 2 puffs about 12 hours later. spiriva 2 puffs in am only  Plan B = Backup Only use your albuterol as a rescue medication  Plan C = Crisis - only use your albuterol nebulizer if you first try Plan B and it fails to help > ok to use the nebulizer up to every 4 hours but if start needing it regularly call for immediate appointment The key is to stop smoking completely before smoking completely stops you!  Keep your follow up appt but bring all inhalers and nebulizer solutions with you to this visit    09/22/2017  f/u ov/ re: still smoking/ did not bring meds as rec/ saba hfa maybe twice  A week  On maint symb 160/spiriva  Chief Complaint  Patient presents with  . Follow-up    Pt c/o chest tightness on the left off and on for the past 3 wks.   Dyspnea:  Better than usual able to do [place] slow pace = MMRC2 = can't walk a nl pace on a flat grade s sob but does fine slow and flat  Cough: minimal/ mostly am mucoid Sleep: on 2lpm on side and 2 pillows ok  rec The key is to stop smoking completely before smoking completely stops you!  Ok to try symb 80 Take 2 puffs first thing in am and then another 2 puffs about 12 hours later to see if breathing is as good For thrush> clotrimazole troche 10 mg up to 4 x daily as needed          04/29/2018  f/u ov/ re: GOLD III/ 02 dep hs and ex/ still smoking / still on augmentin /clind for ";pna" (no xrays avail) Chief Complaint  Patient presents with  . Follow-up    Had PNA recently.  He states he did not have his albuterol so he has been using his symbicort up to 4 x daily with no relief. He is not coughing much, but feels SOB.    Dyspnea: X 50 ft  Cough: resolved Sleeping: flat/ one pillow SABA use: ran out 02: 2lpmhs/ 3lpm activity    No obvious day to day or daytime variability or assoc excess/ purulent sputum or mucus plugs   or hemoptysis or cp or chest tightness, subjective wheeze or overt sinus or hb  symptoms.   Sleeping as above  without nocturnal  or early am exacerbation  of respiratory  c/o's or need for noct saba. Also denies any obvious fluctuation of symptoms with weather or environmental changes or other aggravating or alleviating factors except as outlined above   No unusual exposure hx or h/o childhood pna/ asthma or knowledge of premature birth.  Current Allergies, Complete Past Medical History, Past Surgical History, Family History, and Social History were reviewed in [employer] record.  ROS  The following are not active complaints unless bolded Hoarseness, sore throat, dysphagia, dental problems, itching, sneezing,  nasal congestion or discharge of excess mucus or purulent secretions, ear ache,   fever, chills, sweats, unintended wt loss or wt gain, classically pleuritic or exertional cp,  orthopnea pnd or arm/hand swelling  or leg swelling, presyncope, palpitations, abdominal pain, anorexia, nausea, vomiting, diarrhea  or change in bowel habits or change in bladder habits, change in stools or change in urine, dysuria, hematuria,  rash, arthralgias, visual complaints, headache, numbness, weakness or ataxia or problems with walking or coordination,  change in mood or  memory.        Current Meds  Medication Sig  . albuterol (PROVENTIL HFA;VENTOLIN HFA) 108 (90 Base) MCG/ACT inhaler Inhale 2 puffs into the lungs every 6 (six) hours as needed for wheezing or shortness of breath.  [name] amoxicillin-clavulanate (AUGMENTIN) 875-125 MG tablet Take 1 tablet by mouth 2 (two) times daily.  [name] apixaban (ELIQUIS) 5 MG TABS tablet Take 1 tablet (5 mg total) by mouth 2 (two) times daily.  [name] BIKTARVY 50-200-25 MG TABS tablet TAKE 1 TABLET BY MOUTH EVERY DAY  . Blood Glucose Monitoring Suppl (ONETOUCH VERIO) w/Device KIT 1 kit by Does not apply route 2 (two) times daily.  . budesonide-formoterol (SYMBICORT) 160-4.5 MCG/ACT inhaler Inhale 2 puffs into the lungs 2 (two) times  daily.  . clindamycin (CLEOCIN) 150 MG capsule Take 1 capsule by mouth 2 (two) times daily.  [name] gabapentin (NEURONTIN) 300 MG capsule Take 2 capsules (600 mg total) by mouth 3 (three) times daily.  [name] glucose blood (ONETOUCH VERIO) test strip Use as instructed  . hydrALAZINE (APRESOLINE) 50 MG tablet Take 1.5 tablets (75 mg total) by mouth every 8 (eight) hours.  . metFORMIN (GLUCOPHAGE) 1000 MG tablet Take 1 tablet (1,000 mg total) by mouth daily with breakfast. (Patient taking differently: Take 1,000 mg by mouth 2 (two) times daily with a meal. )  . Misc. Devices MISC Please provide patient with a portable oximeter. Diagnosis: COPD GOLD III  . ONETOUCH DELICA LANCETS 34L MISC Use as instructed  . oxyCODONE (OXY IR/ROXICODONE) 5 MG immediate release tablet Take 1 tablet (5 mg total) by mouth 3 (three) times daily as needed for up to 15 doses for moderate pain or severe pain (pain).  . OXYGEN Inhale 2 L into the lungs as needed (shortness of breath).  . sildenafil (VIAGRA) 25 MG tablet Take 1 tablet (25 mg total) by mouth daily as needed for erectile dysfunction.  [name]  ] budesonide-formoterol (SYMBICORT) 160-4.5 MCG/ACT inhaler Inhale 2 puffs into the lungs 2 (two) times daily.                    Objective:   Physical Exam   Obese amb bm nad   Vital signs reviewed - Note on arrival 02 sats  97% on 3lpm  Continuous    01/05/2015       278 >  02/29/2016  274 > 06/17/2016 277 > 09/25/2016   258  > 12/11/2016   270  > 05/26/2017  270 > 08/05/2017  255  > 09/22/2017   262 > 03/22/2018   244 > 04/29/2018  258  01/28/2016        272   05/16/14 285 lb 6.4 oz (129.457 kg)  04/20/14 282 lb 12.8 oz (128.277 kg)  03/14/14 274 lb 3.2 oz (124.376 kg)        HEENT: nl dentition, turbinates bilaterally, and oropharynx. Nl external ear canals without cough reflex   NECK :  without JVD/Nodes/TM/ nl carotid upstrokes bilaterally   LUNGS: no acc muscle use,  Nl contour chest with localized insp/exp rhonchi  post R base without cough on insp or exp maneuvers   CV:  RRR  no s3 or murmur or increase in P2, and no edema   ABD:  soft and nontender with nl inspiratory excursion in the supine position. No bruits or organomegaly appreciated, bowel sounds nl  MS:  Nl gait/ ext warm without deformities, calf tenderness, cyanosis or clubbing No obvious joint restrictions   SKIN: warm and dry without lesions    NEURO:  alert, approp, nl sensorium with  no motor or cerebellar deficits apparent.           Assessment & Plan:   

## 2018-05-01 ENCOUNTER — Encounter: Payer: Self-pay | Admitting: Internal Medicine

## 2018-05-01 NOTE — Assessment & Plan Note (Signed)
-   04/28/2013 PFT's FEV1 1.21 (39%) ratio 59 and 13% better p B2 and dlco 72 corrects to 102  - started spiriva 04/30/2013 > changed to respimat 02/28/2014 -Med calendar 03/14/2014 > not using as of 05/16/14  - arrived on stiolto / symbicort 02/29/2016 > changed to symbicort 80/spiriva (lower dose ICS due to interaction with aids meds  - PFT's  06/17/2016  FEV1 1.29 (42 % ) ratio 64  p 43 % improvement from saba p symb 80 /spiriva prior to study with DLCO  51/49c % corrects to 86  % for alv volume   - 06/17/2016   change symb to 160 2bid  - 09/25/2016  After extensive coaching device  effectiveness =    90% with dpi/ elipta > changed to Incruse per Insurance restrictions > preferred spiriva - 12/11/2016   changed to spiriva respimat   - 09/22/2017 changed to symb 80 2bid with pseudowheeze and thrush  - PFT's  03/22/2018  FEV1 1.34 (45 % ) ratio 63  p 17 % improvement from saba p sym 160/ incruse prior to study with DLCO  36/41 % corrects to 72  % for alv volume    AB component fueled probably by cig smoking and poor mc function causing recurrent pattern of pna but nothing else to offer at this point that will counter the effects of cigs/ advised   - The proper method of use, as well as anticipated side effects, of a metered-dose inhaler are discussed and demonstrated to the patient.     rec complete present course of abx then return  with all meds in hand using a trust but verify approach to confirm accurate Medication  Reconciliation The principal here is that until we are certain that the  patients are doing what we've asked, it makes no sense to ask them to do more.    I had an extended discussion with the patient reviewing all relevant studies completed to date and  lasting 15 to 20 minutes of a 25 minute visit    See device teaching which extended face to face time for this visit.  Each maintenance medication was reviewed in detail including emphasizing most importantly the difference between  maintenance and prns and under what circumstances the prns are to be triggered using an action plan format that is not reflected in the computer generated alphabetically organized AVS which I have not found useful in most complex patients, especially with respiratory illnesses  Please see AVS for specific instructions unique to this visit that I personally wrote and verbalized to the the pt in detail and then reviewed with pt  by my nurse highlighting any  changes in therapy recommended at today's visit to their plan of care.

## 2018-05-01 NOTE — Assessment & Plan Note (Signed)
-    HCO3 31 01/17/16 - 01/05/2015  Walked RA x 3 laps @ 185 ft each stopped due to  Leg pain and fatibue, no sob , nl pace, no desat  -  12/11/2016 Patient Saturations on Room Air at Rest = 88%----increased 98% 2lpm continuous - HC03  24    03/02/18  - 03/22/2018  Walked RA x 3 laps @ 185 ft each stopped due to  End of study, moderate pace, no   desat  / min sob  Likely combination of obesity and copd > rec 2lpm hs and titrate daytime with activity and regroup in 2 weeks with walking sats on/ off 02

## 2018-05-01 NOTE — Assessment & Plan Note (Signed)

## 2018-05-04 IMAGING — DX DG CHEST 1V PORT
1 series · 1 of 1 positions shown · non-contrast
Comparison: 06/07/2017, 05/26/2017 and 08/08/2016 and chest CT
dated 12/12/2013

CLINICAL DATA: Shortness of breath.

EXAM:
PORTABLE CHEST 1 VIEW

[chest]
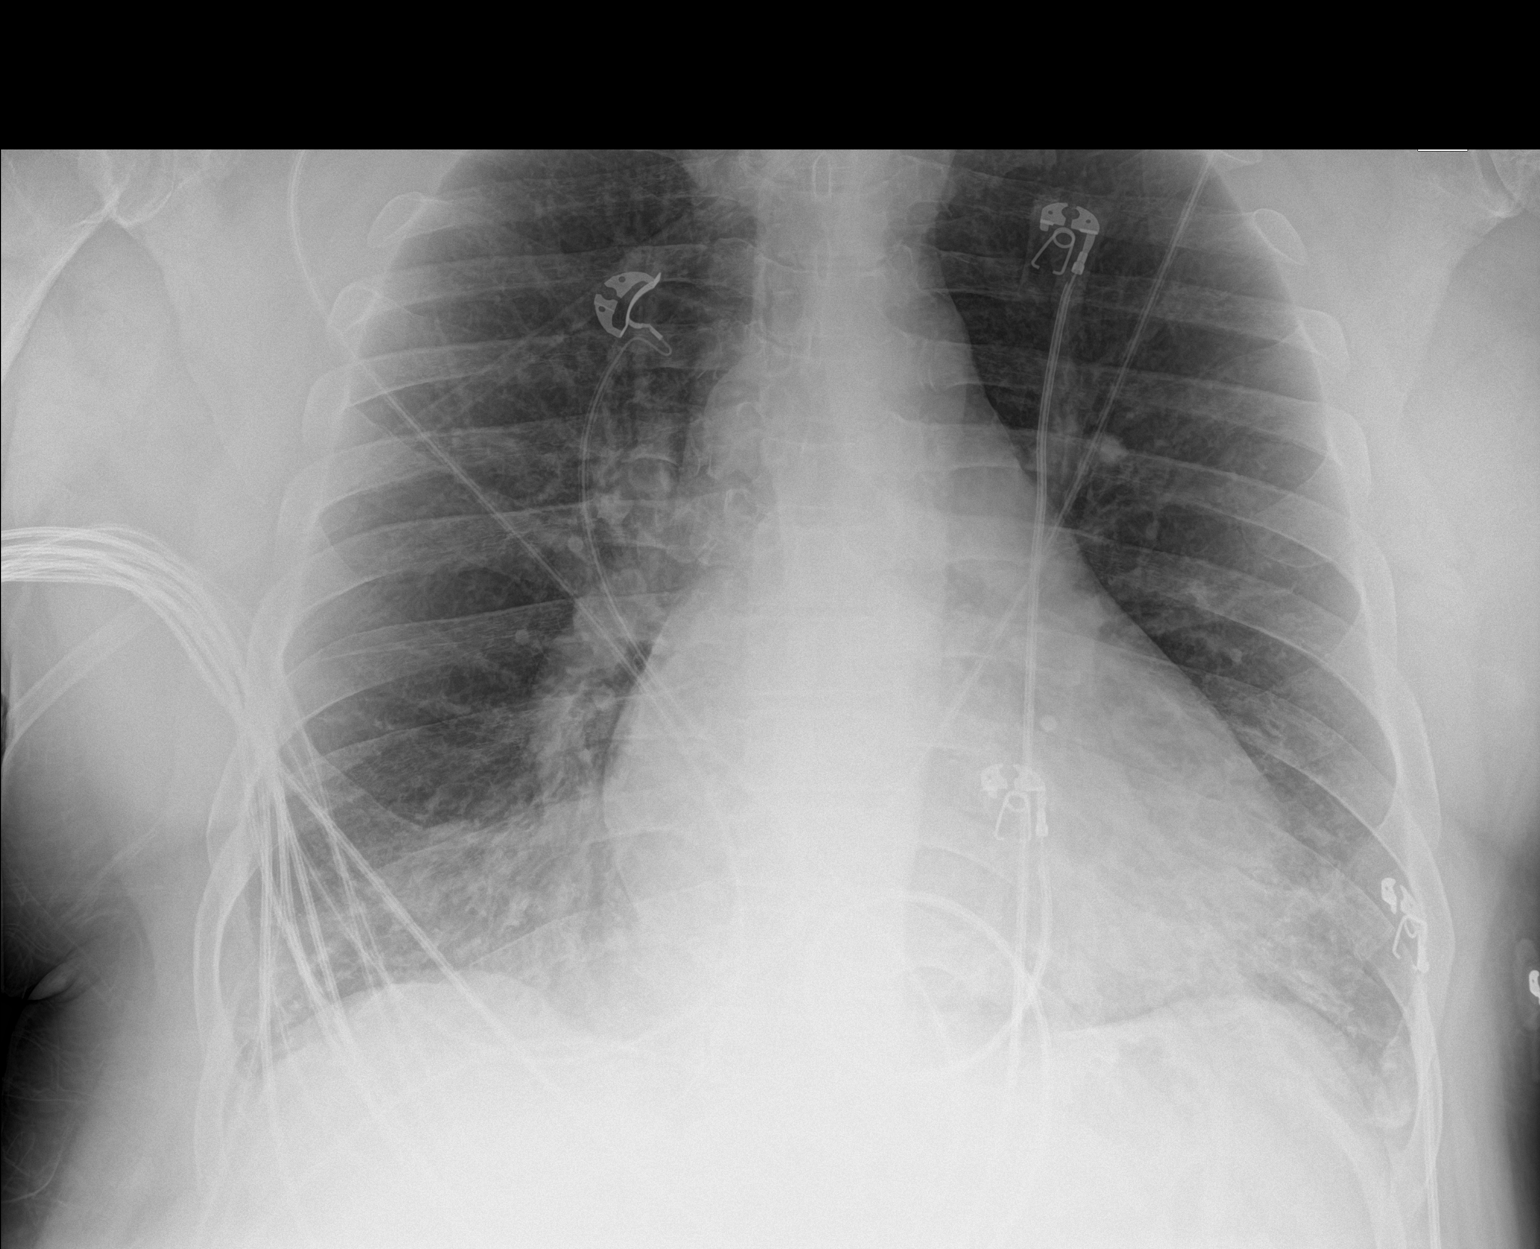

[1 of 1 positions shown; findings below may reference images not displayed]

FINDINGS: Heart size and pulmonary vascularity are normal. There is slight
chronic accentuation of the interstitial markings at the lung bases
and the patient has bilateral prominent pericardial fat pads as
demonstrated by the prior CT scan.

No acute infiltrates or effusions.  No acute bone abnormality.
IMPRESSION: No active disease.

## 2018-05-06 DIAGNOSIS — G8921 Chronic pain due to trauma: Secondary | ICD-10-CM | POA: Diagnosis not present

## 2018-05-06 DIAGNOSIS — M5136 Other intervertebral disc degeneration, lumbar region: Secondary | ICD-10-CM | POA: Diagnosis not present

## 2018-05-06 DIAGNOSIS — R69 Illness, unspecified: Secondary | ICD-10-CM | POA: Diagnosis not present

## 2018-05-06 DIAGNOSIS — Z79899 Other long term (current) drug therapy: Secondary | ICD-10-CM | POA: Diagnosis not present

## 2018-05-06 IMAGING — CR DG CHEST 2V
3 series · 3 of 3 positions shown · non-contrast
Comparison: 07/21/2017 .

CLINICAL DATA: COPD.

EXAM:
CHEST  2 VIEW

[chest lat (1 of 2)]
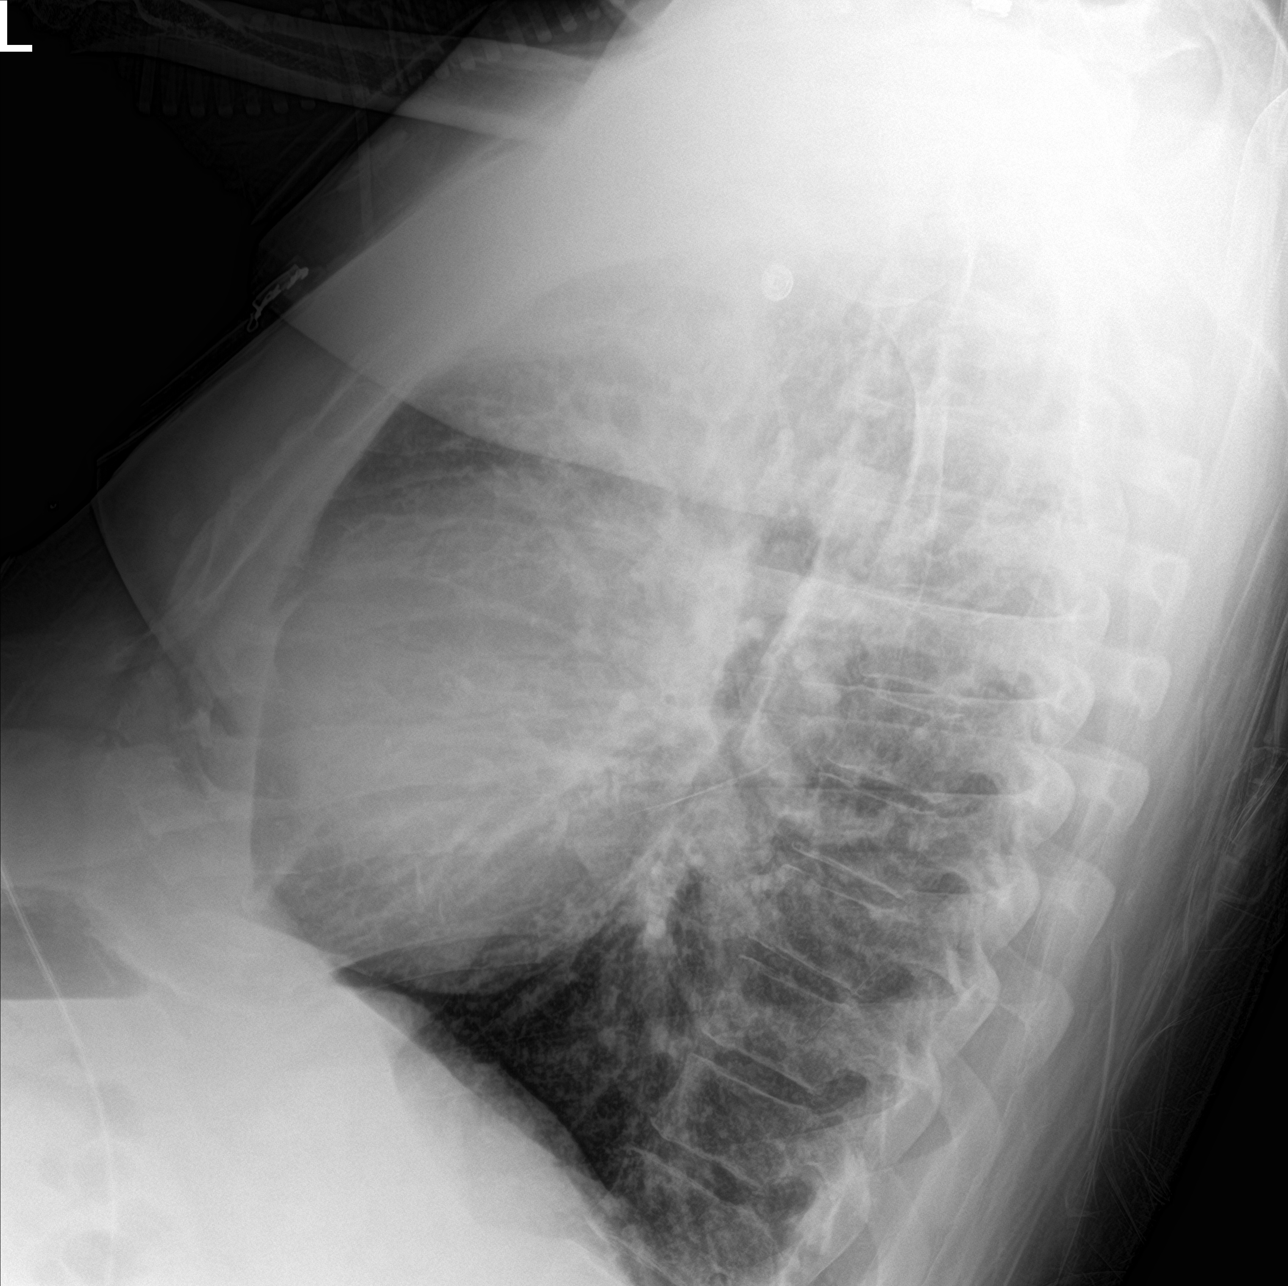

[chest ap]
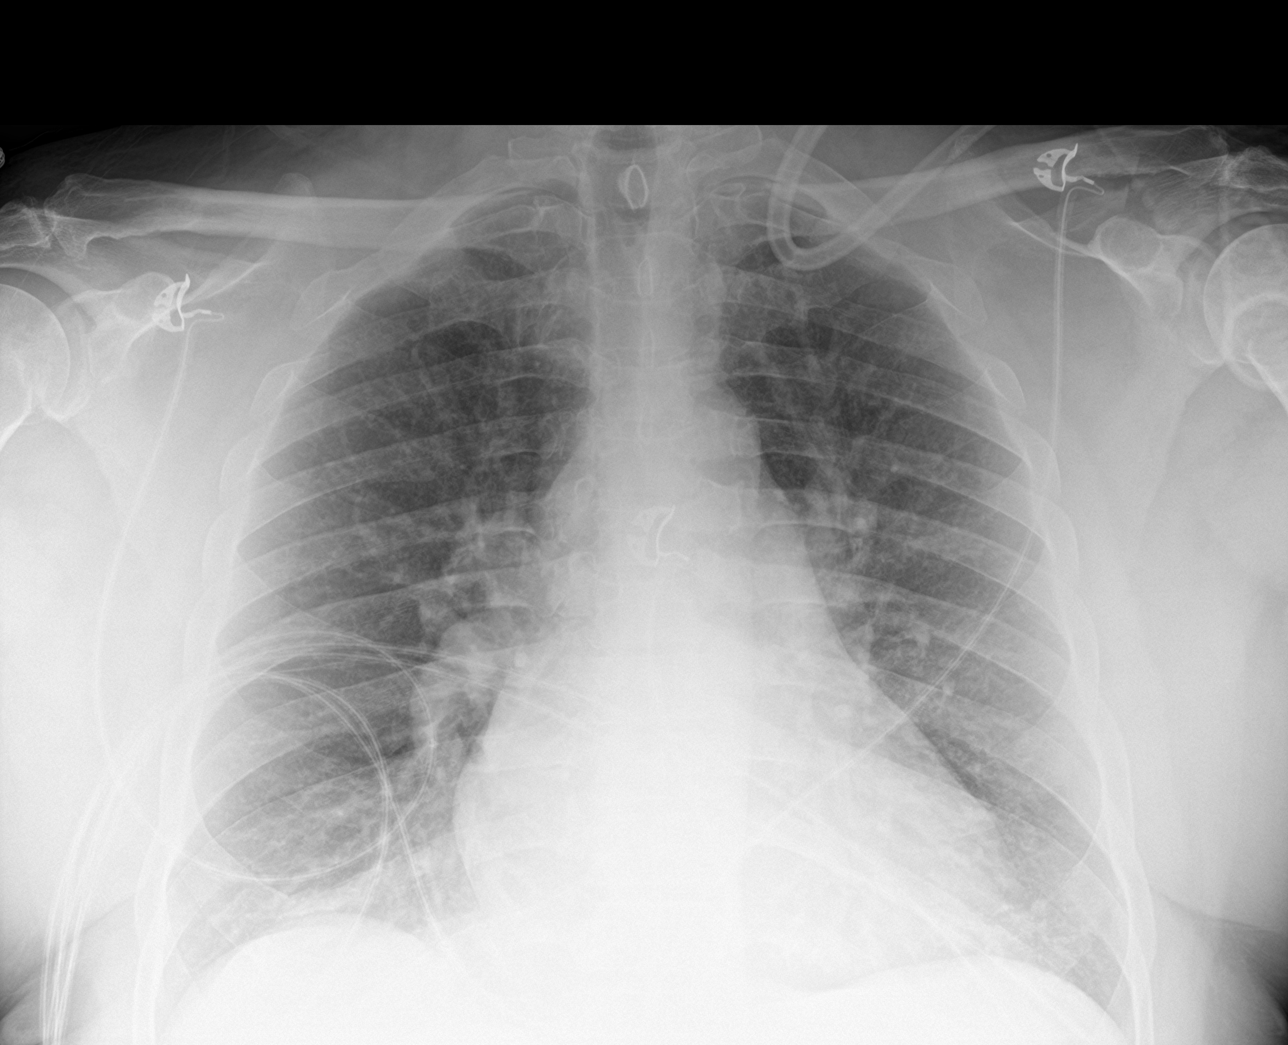

[chest lat (2 of 2)]
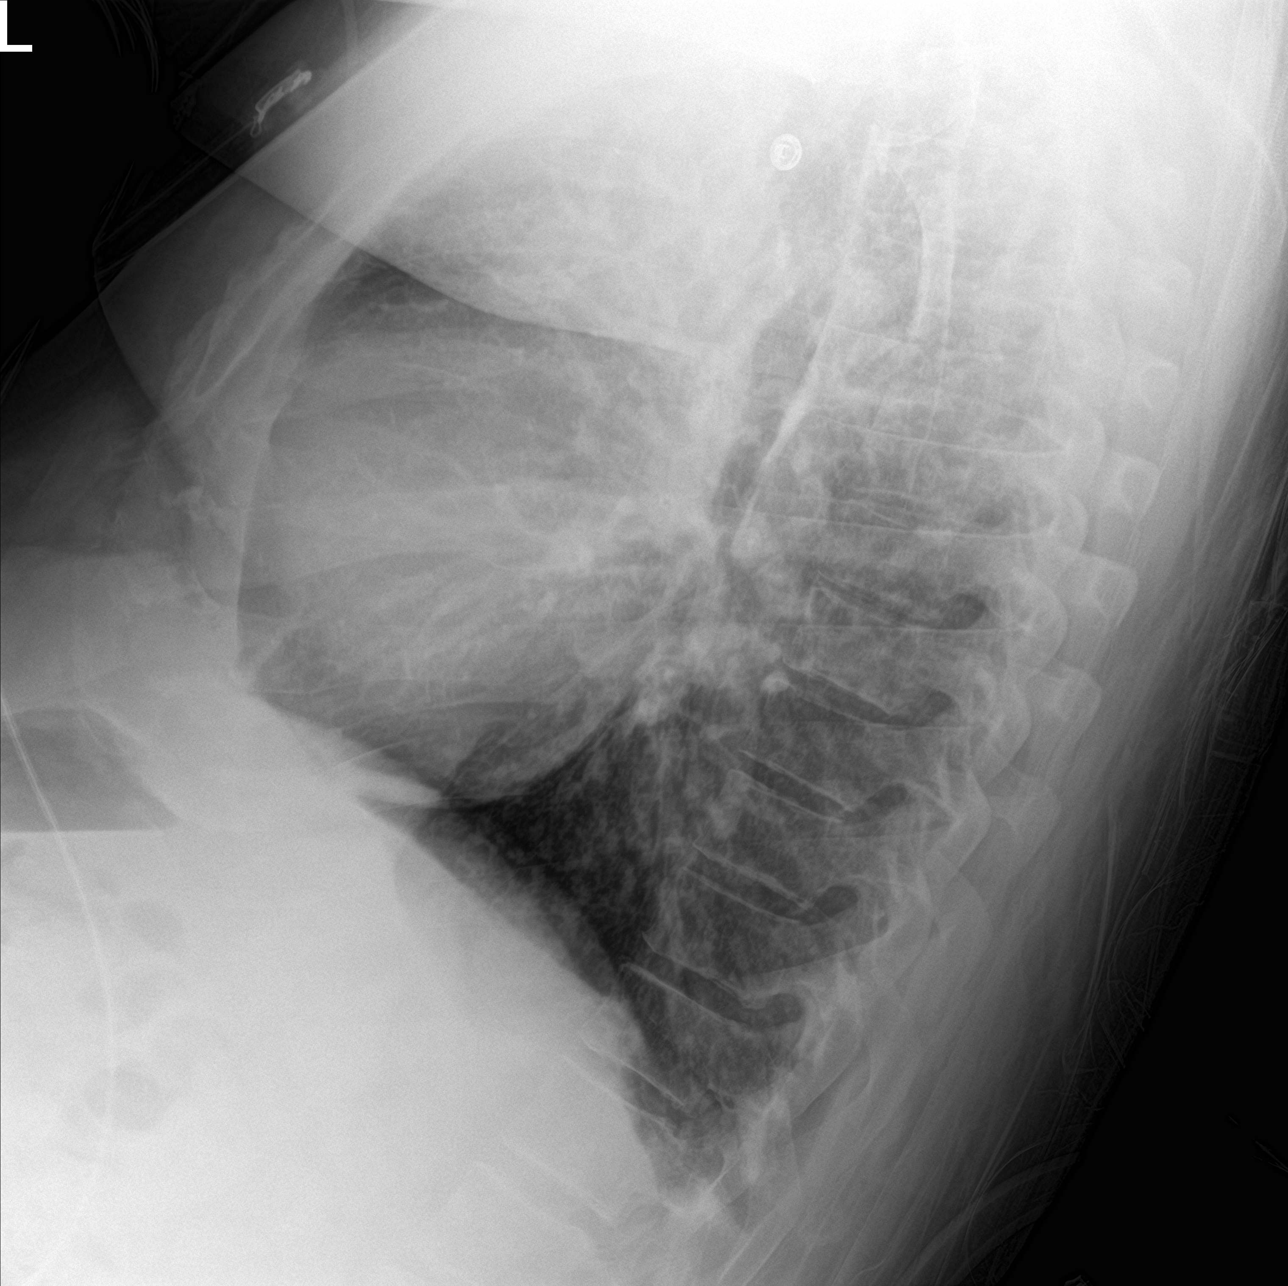

[3 of 3 positions shown; findings below may reference images not displayed]

FINDINGS: Mediastinum hilar structures normal. Cardiomegaly with mild
pulmonary vascular prominence and bilateral interstitial prominence
suggesting mild CHF. No pleural effusion or pneumothorax. No acute
bony abnormality.
IMPRESSION: Cardiomegaly with mild pulmonary vascular congestion and bilateral
interstitial prominence suggesting mild CHF.

## 2018-05-06 IMAGING — CT CT ANGIO CHEST
3 of 7 series · 19 of 36 positions shown · IV contrast (APPLIED)
Comparison: None.

CLINICAL DATA: Shortness of breath, COPD

EXAM:
CT ANGIOGRAPHY CHEST WITH CONTRAST
TECHNIQUE: Multidetector CT imaging of the chest was performed using the
standard protocol during bolus administration of intravenous
contrast. Multiplanar CT image reconstructions and MIPs were
obtained to evaluate the vascular anatomy.
CONTRAST:  80mL H4ZKSJ-DG3 IOPAMIDOL (H4ZKSJ-DG3) INJECTION 76%

[Series 6: lung f_0.6 · axial · 0.78mm/px · z∈[-284,-116]mm · 3 of 170 slices shown]
[im 43/170  mediastinal]
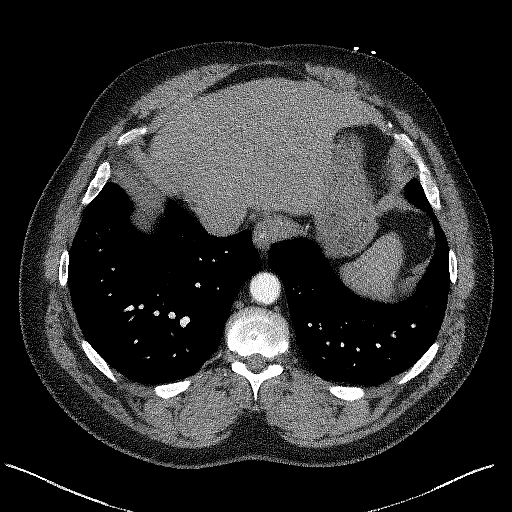
[im 85/170  mediastinal]
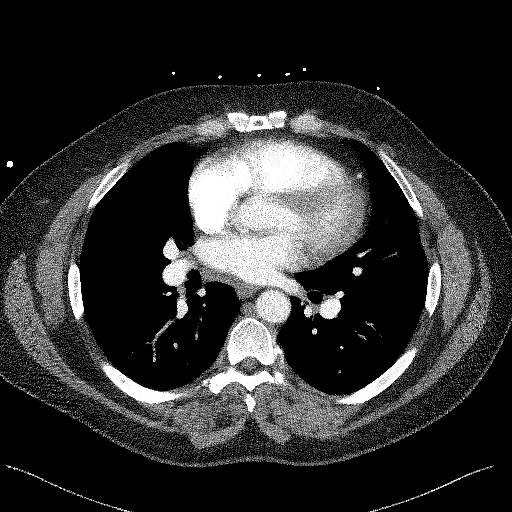
[im 127/170  mediastinal]
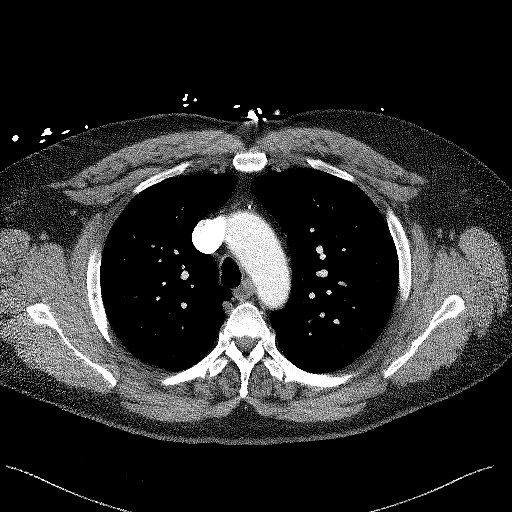

[Series 9: thins f_0.6 · axial · 0.78mm/px · z∈[-355,-38]mm · 15 of 519 slices shown]
[im 33/519  lung]
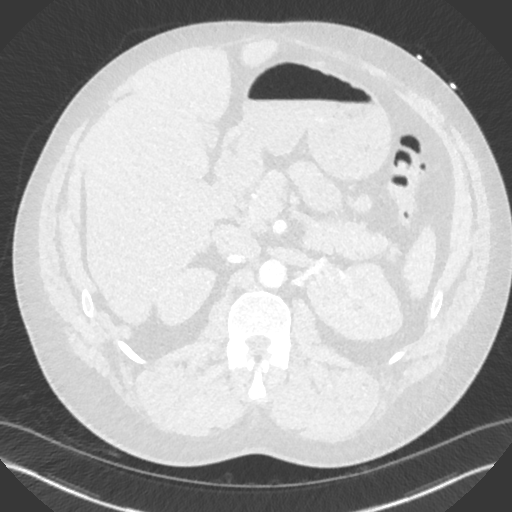
[im 65/519  mediastinal]
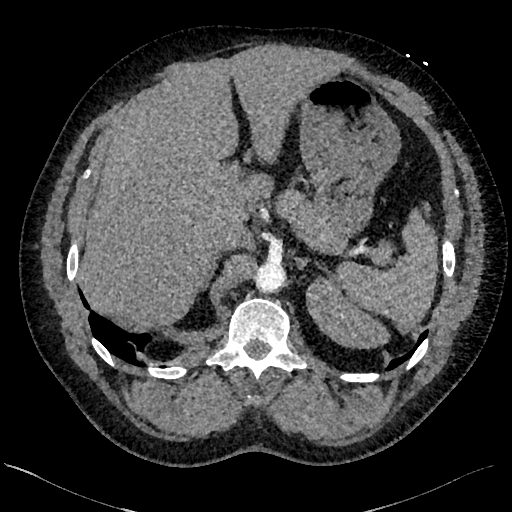
[im 98/519  lung]
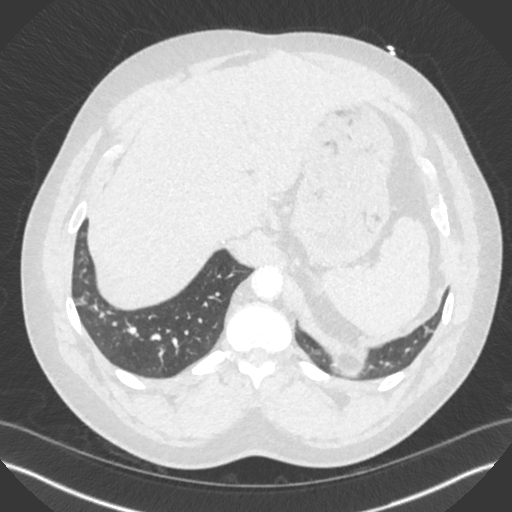
[im 130/519  mediastinal]
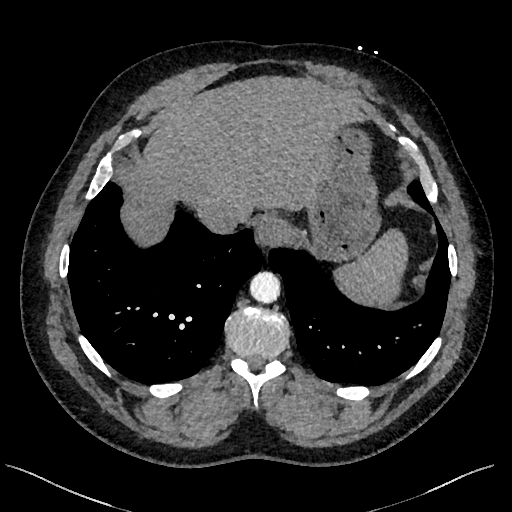
[im 162/519  lung]
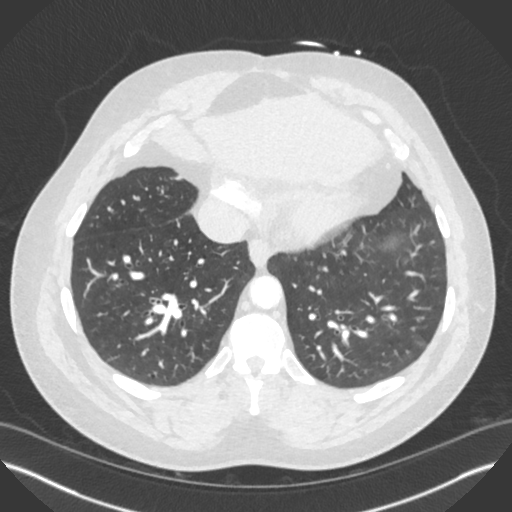
[im 195/519  mediastinal]
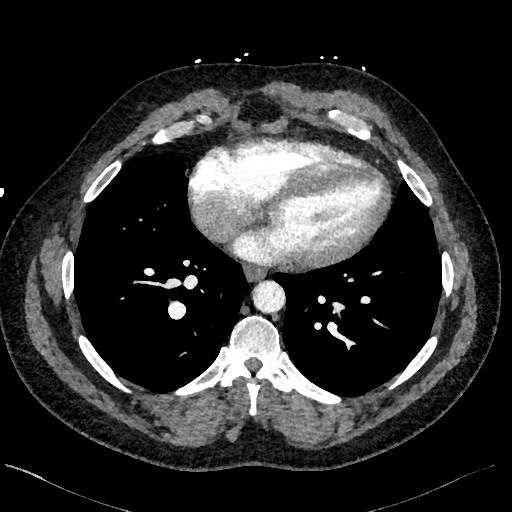
[im 227/519  lung]
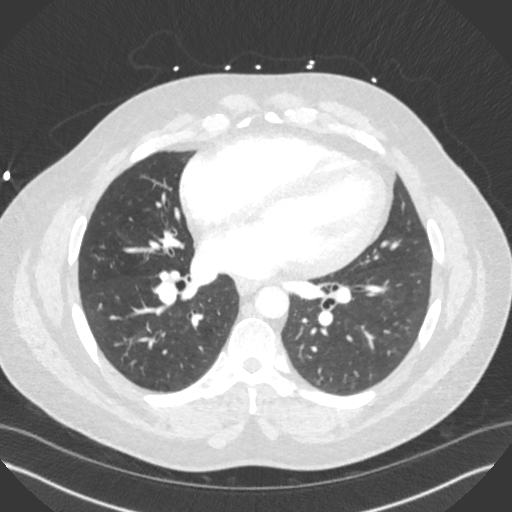
[im 260/519  mediastinal]
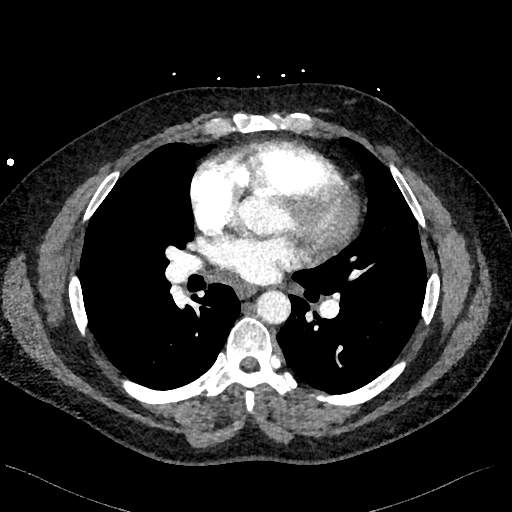
[im 292/519  lung]
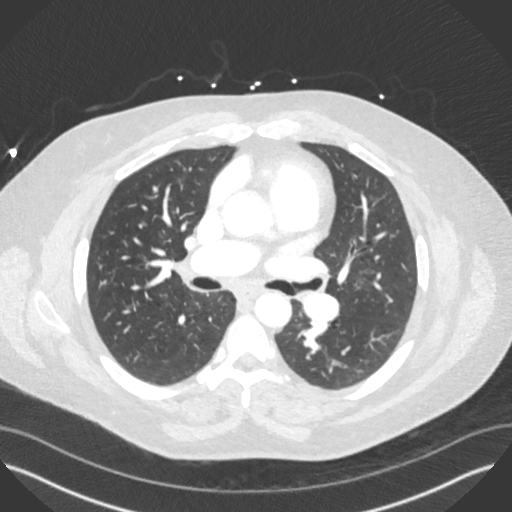
[im 324/519  mediastinal]
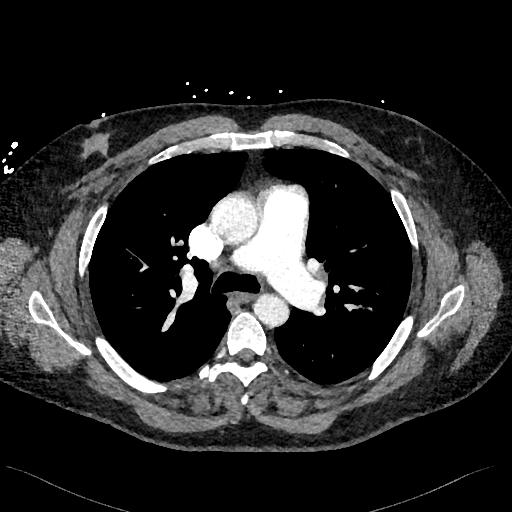
[im 357/519  lung]
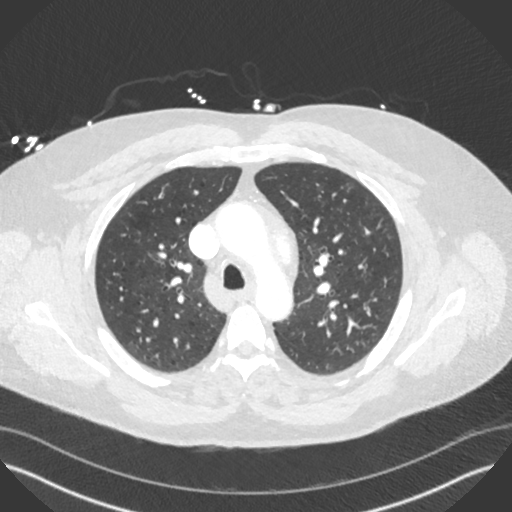
[im 389/519  mediastinal]
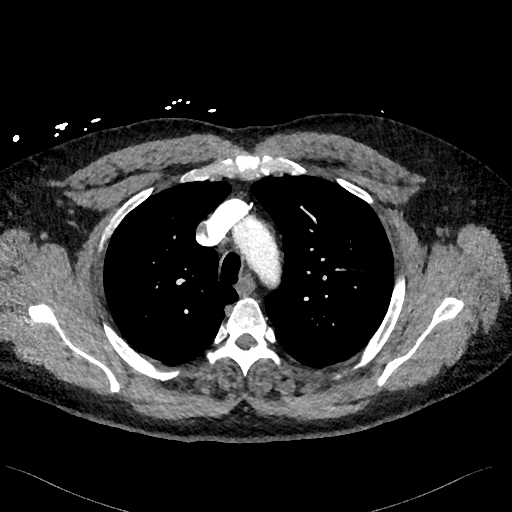
[im 421/519  lung]
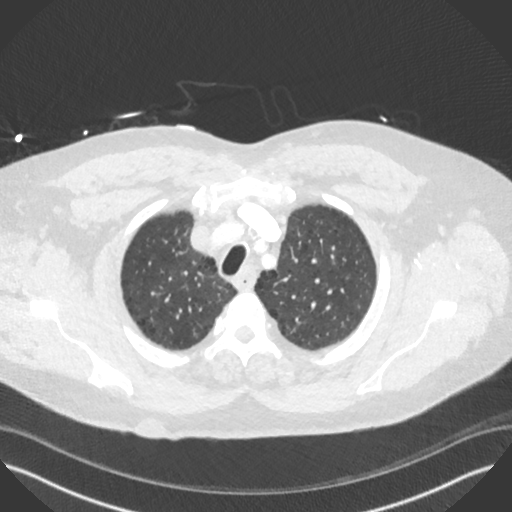
[im 454/519  mediastinal]
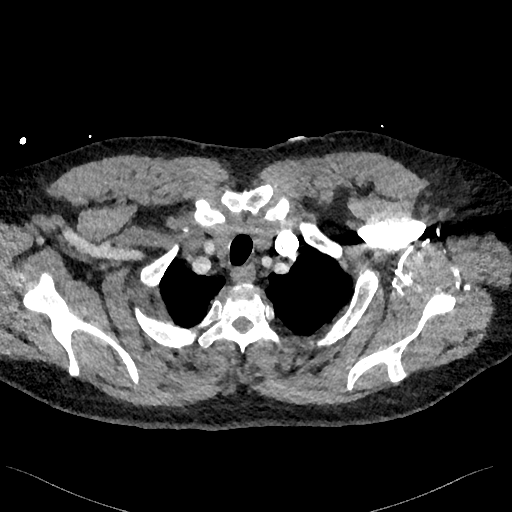
[im 486/519  lung]
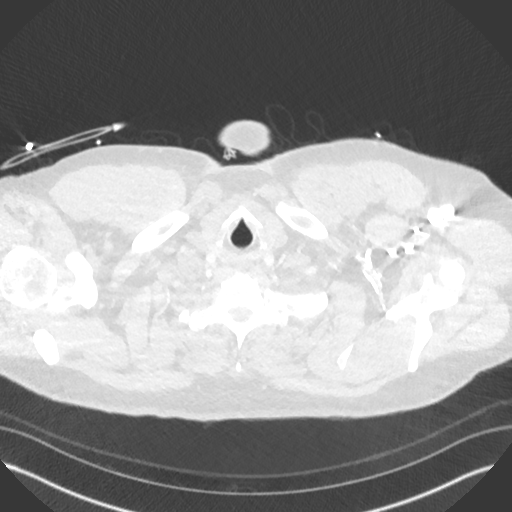

[Series 10: coronal f_0.6 · coronal · 0.69mm/px · 1 of 154 slices shown]
[im 77/154  mediastinal]
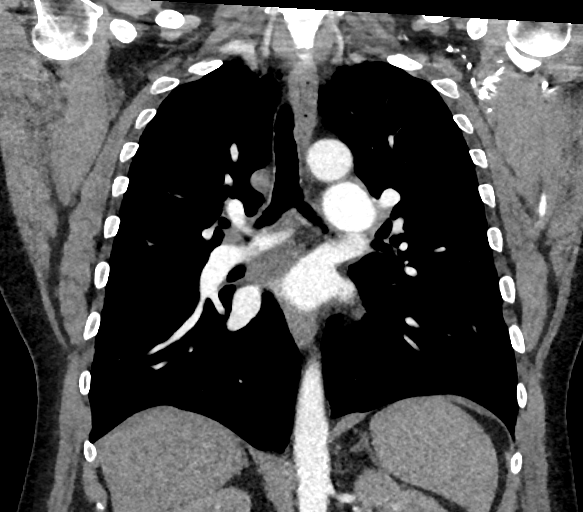

[19 of 36 positions shown; findings below may reference images not displayed]

FINDINGS: Cardiovascular: No filling defects in the pulmonary arteries to
suggestpulmonary emboli. Heart is mildly enlarged. Aorta is normal
caliber.

Mediastinum/Nodes: No mediastinal, hilar, or axillary adenopathy.
Trachea and esophagus are unremarkable. Thyroid unremarkable.

Lungs/Pleura: Mild centrilobular and paraseptal emphysema. No
confluent opacities or pleural effusions.

Upper Abdomen: Imaging into the upper abdomen shows no acute
findings.

Musculoskeletal: Chest wall soft tissues are unremarkable. No acute
bony abnormality.

Review of the MIP images confirms the above findings.
IMPRESSION: No evidence of pulmonary embolus.

Mild emphysema.

No acute findings.

## 2018-05-13 ENCOUNTER — Ambulatory Visit: Payer: Self-pay | Admitting: Internal Medicine

## 2018-05-16 DIAGNOSIS — J129 Viral pneumonia, unspecified: Secondary | ICD-10-CM | POA: Diagnosis not present

## 2018-05-16 DIAGNOSIS — J449 Chronic obstructive pulmonary disease, unspecified: Secondary | ICD-10-CM | POA: Diagnosis not present

## 2018-05-17 ENCOUNTER — Ambulatory Visit (INDEPENDENT_AMBULATORY_CARE_PROVIDER_SITE_OTHER): Payer: Medicare HMO | Admitting: Internal Medicine

## 2018-05-17 ENCOUNTER — Ambulatory Visit (INDEPENDENT_AMBULATORY_CARE_PROVIDER_SITE_OTHER)
Admission: RE | Admit: 2018-05-17 | Discharge: 2018-05-17 | Disposition: A | Discharge status: Home / Self Care | Payer: Medicare HMO | Source: Ambulatory Visit | Attending: Internal Medicine | Admitting: Internal Medicine

## 2018-05-17 ENCOUNTER — Encounter: Payer: Self-pay | Admitting: Internal Medicine

## 2018-05-17 VITALS — BP 118/76 | HR 91 | Ht 69.0 in | Wt 252.6 lb

## 2018-05-17 DIAGNOSIS — J189 Pneumonia, unspecified organism: Secondary | ICD-10-CM | POA: Diagnosis not present

## 2018-05-17 DIAGNOSIS — J9611 Chronic respiratory failure with hypoxia: Secondary | ICD-10-CM | POA: Diagnosis not present

## 2018-05-17 DIAGNOSIS — R69 Illness, unspecified: Secondary | ICD-10-CM | POA: Diagnosis not present

## 2018-05-17 DIAGNOSIS — J9612 Chronic respiratory failure with hypercapnia: Secondary | ICD-10-CM | POA: Diagnosis not present

## 2018-05-17 DIAGNOSIS — J449 Chronic obstructive pulmonary disease, unspecified: Secondary | ICD-10-CM

## 2018-05-17 DIAGNOSIS — F17210 Nicotine dependence, cigarettes, uncomplicated: Secondary | ICD-10-CM | POA: Diagnosis not present

## 2018-05-17 MED ORDER — GLYCOPYRROLATE-FORMOTEROL 9-4.8 MCG/ACT IN AERO: 2.0000 | INHALATION_SPRAY | Freq: Two times a day (BID) | RESPIRATORY_TRACT | 11 refills | Status: DC

## 2018-05-17 NOTE — Progress Notes (Signed)
Subjective:    Patient ID: Chris [name], male    DOB: 12/15/1965   MRN: [id number]    Brief patient profile:  52 yobm active smoker with HIV  and doe x 2009 much worse since March 2014 so referred by [name] to pulmonary clinic 02/15/2013 with GOLD III copd by pfts 04/28/13.     History of Present Illness  02/15/2013 1st pulmonary eval  Cc indolent onset progressive doe x walking slow pace more than 100 ft,  not at rest., 02 dep, doe much worse x 3 months assoc with congested cough esp in am with sev tbsp thick white mucus takes about 30 min to clear.  No better on inhalers tried to date including advair and spiriva - albuterol works the best and using lots of saba and nebs rec Stop advair and spiriva Plan A = Automatic = Start symbicort 160 Take 2 puffs first thing in am and then another 2 puffs about 12 hours later.  Plan B = backup= Only use your albuterol (as a rescue medication to be used if you can't catch your breath by resting or doing a relaxed purse lip breathing pattern. The less you use it, the better it will work when you need it. Ok to use up to 2 puffs every 4 hours Plan C = nebulizer, use this only if plan B doesn't work ok to use up to 4 hours       05/26/2017  Acute  ov/Chris [name] re:   COPD III/ symb/spiriva rare saba / 02 2pm hs then during day prn  Chief Complaint  Patient presents with  . Acute Visit    Increased DOE x 3 days.   most days gets up 6 am with sob/some congestion > symb x 2 and then spiriva and saba and maybe once a week with saba hfa / did not bring it with him  Then worse gradually x 3 days, waking up 4am not using rescue then  Not much cough-   does not bother sleep until 4 am but even then minimal vol of mucus/ white  Needing 02 2lpm 24/7 now  On PPI bid ac  rec Plan A = Automatic =  symbicort 160 Take 2 puffs first thing in am and then another 2 puffs about 12 hours later.                                       spiriva 2 puffs  Work on inhaler  technique:   Plan B = Backup Only use your albuterol as a rescue medication Plan C = Crisis - only use your albuterol nebulizer if you first try Plan B The key is to stop smoking completely before smoking completely stops you!  Prednisone 10 mg take  4 each am x 2 days,   2 each am x 2 days,  1 each am x 2 days and stop  Please schedule a follow up visit in 3 months but call sooner if needed    Admit date: 07/21/2017 Discharge date: 07/28/2017  Time spent: 35 minutes  Recommendations for Outpatient Follow-up:  1. Repeat basic metabolic panel to follow electrolytes and renal function 2. Patient needs outpatient sleep study to sit up and determine candidacy for CPAP. 3. Reassess volume status and blood pressure, adjust medications as needed. 4. Close follow-up to patient's CBG and A1c; further adjust hypoglycemic regimen as indicated.    Discharge Diagnoses:  Active Problems:   HIV disease ([place])   COPD  GOLD III, still smoking   Steroid-induced hyperglycemia   Chronic respiratory failure with hypoxia and hypercapnia ([facility])   DM (diabetes mellitus) (H[facility])   COPD exacerbation (H[facility])   Obstructive sleep apnea syndrome   Essential hypertension, benign   Atopic eczema   Type 2 diabetes mellitus with complication (HCC)   Morbid (severe) obesity due to excess calories ([place])   COPD with acute exacerbation (HCC)   Depression   GERD (gastroesophageal reflux disease)   Acute on chronic respiratory failure with hypoxia ([place])    08/05/2017  Post hosp  f/u ov/Chris [name] re:  p copd exac - did not follow action plan reviewed at last ov prior to ER Chief Complaint  Patient presents with  . Follow-up    hospital f/u- [facility], D/C last week with COPD, using 3L [place], still has some SOB  with 02    -  MMRC2 = can't walk a nl pace on a flat grade s sob but does fine slow and flat  Using 02 as needed daytime and 2lpm hs  rec Work on inhaler technique:   Plan A = Automatic =  symbicort 160 Take 2 puffs  first thing in am and then another 2 puffs about 12 hours later. spiriva 2 puffs in am only  Plan B = Backup Only use your albuterol as a rescue medication  Plan C = Crisis - only use your albuterol nebulizer if you first try Plan B and it fails to help > ok to use the nebulizer up to every 4 hours but if start needing it regularly call for immediate appointment The key is to stop smoking completely before smoking completely stops you!  Keep your follow up appt but bring all inhalers and nebulizer solutions with you to this visit    09/22/2017  f/u ov/Chris [name] re: still smoking/ did not bring meds as rec/ saba hfa maybe twice  A week  On maint symb 160/spiriva  Chief Complaint  Patient presents with  . Follow-up    Pt c/o chest tightness on the left off and on for the past 3 wks.   Dyspnea:  Better than usual able to do [place] slow pace = MMRC2 = can't walk a nl pace on a flat grade s sob but does fine slow and flat  Cough: minimal/ mostly am mucoid Sleep: on 2lpm on side and 2 pillows ok  rec The key is to stop smoking completely before smoking completely stops you!  Ok to try symb 80 Take 2 puffs first thing in am and then another 2 puffs about 12 hours later to see if breathing is as good For thrush> clotrimazole troche 10 mg up to 4 x daily as needed       01/21/18  Admit with and voice not right since   04/29/2018  f/u ov/Chris [name] re: GOLD III/ 02 dep hs and ex/ still smoking / still on augmentin /clind for ";pna" (no xrays avail) Chief Complaint  Patient presents with  . Follow-up    Had PNA recently.  He states he did not have his albuterol so he has been using his symbicort up to 4 x daily with no relief. He is not coughing much, but feels SOB.    Dyspnea: X 50 ft  Cough: resolved Sleeping: flat/ one pillow SABA use: ran out 02: 2lpmhs/ 3lpm activity  rec No change rx     05/17/2018  f/u ov/Chris [name] re:  GOLD III/ 02 dep / still smoking Chief Complaint  Patient presents with  .  Follow-up    Breathing is overall doing well. He is using his albuterol inhaler once daily on average.   Dyspnea:  MMRC3 = can't walk 100 yards even at a slow pace at a flat grade s stopping due to sob even on 3lpm  Cough: min productions Sleeping: flat / on side with one pillow and  SABA maybe once a day when over does it  02: use:2lpm 24/7 except sometimes turns up with walking to 3lpm     No obvious day to day or daytime variability or assoc excess/ purulent sputum or mucus plugs or hemoptysis or cp or chest tightness, subjective wheeze or overt sinus or hb symptoms.   Sleeping as above  without nocturnal  or early am exacerbation  of respiratory  c/o's or need for noct saba. Also denies any obvious fluctuation of symptoms with weather or environmental changes or other aggravating or alleviating factors except as outlined above   No unusual exposure hx or h/o childhood pna/ asthma or knowledge of premature birth.  Current Allergies, Complete Past Medical History, Past Surgical History, Family History, and Social History were reviewed in [employer] record.  ROS  The following are not active complaints unless bolded Hoarseness, sore throat, dysphagia, dental problems, itching, sneezing,  nasal congestion or discharge of excess mucus or purulent secretions, ear ache,   fever, chills, sweats, unintended wt loss or wt gain, classically pleuritic or exertional cp,  orthopnea pnd or arm/hand swelling  or leg swelling resolved , presyncope, palpitations, abdominal pain, anorexia, nausea, vomiting, diarrhea  or change in bowel habits or change in bladder habits, change in stools or change in urine, dysuria, hematuria,  rash, arthralgias, visual complaints, headache, numbness, weakness or ataxia or problems with walking or coordination,  change in mood or  memory.        Current Meds  Medication Sig  . albuterol (PROVENTIL HFA;VENTOLIN HFA) 108 (90 Base) MCG/ACT inhaler Inhale  2 puffs into the lungs every 6 (six) hours as needed for wheezing or shortness of breath.  [name] apixaban (ELIQUIS) 5 MG TABS tablet Take 1 tablet (5 mg total) by mouth 2 (two) times daily.  [name] BIKTARVY 50-200-25 MG TABS tablet TAKE 1 TABLET BY MOUTH EVERY DAY  . Blood Glucose Monitoring Suppl (ONETOUCH VERIO) w/Device KIT 1 kit by Does not apply route 2 (two) times daily.  . budesonide-formoterol (SYMBICORT) 160-4.5 MCG/ACT inhaler Inhale 2 puffs into the lungs 2 (two) times daily.  . clindamycin (CLEOCIN) 150 MG capsule Take 1 capsule by mouth 2 (two) times daily.  [name] glucose blood (ONETOUCH VERIO) test strip Use as instructed  . hydrALAZINE (APRESOLINE) 50 MG tablet Take 1.5 tablets (75 mg total) by mouth every 8 (eight) hours.  . metFORMIN (GLUCOPHAGE) 1000 MG tablet Take 1 tablet (1,000 mg total) by mouth daily with breakfast. (Patient taking differently: Take 1,000 mg by mouth 2 (two) times daily with a meal. )  . Misc. Devices MISC Please provide patient with a portable oximeter. Diagnosis: COPD GOLD III  . ONETOUCH DELICA LANCETS 37C MISC Use as instructed  . oxyCODONE (OXY IR/ROXICODONE) 5 MG immediate release tablet Take 1 tablet (5 mg total) by mouth 3 (three) times daily as needed for up to 15 doses for moderate pain or severe pain (pain).  . OXYGEN Inhale 2 L into the lungs as needed (shortness of  breath).  . sildenafil (VIAGRA) 25 MG tablet Take 1 tablet (25 mg total) by mouth daily as needed for erectile dysfunction.                      Objective:   Physical Exam   Obese mod hoarse amb bm nad    Vital signs reviewed - Note on arrival 02 sats  99% on 3lpm cont      01/05/2015       278 >  02/29/2016  274 > 06/17/2016 277 > 09/25/2016   258  > 12/11/2016   270  > 05/26/2017  270 > 08/05/2017  255  > 09/22/2017   262 > 03/22/2018   244 > 04/29/2018  258 > 05/17/2018  253  01/28/2016        272   05/16/14 285 lb 6.4 oz (129.457 kg)  04/20/14 282 lb 12.8 oz (128.277 kg)  03/14/14 274  lb 3.2 oz (124.376 kg)        HEENT: nl dentition, turbinates bilaterally, and oropharynx. Nl external ear canals without cough reflex   NECK :  without JVD/Nodes/TM/ nl carotid upstrokes bilaterally   LUNGS: no acc muscle use,  Nl contour chest with min  insp / exp rhonchi  bilaterally without cough on insp or exp maneuvers   CV:  RRR  no s3 or murmur or increase in P2, and no edema   ABD:  Obese soft and nontender with nl inspiratory excursion in the supine position. No bruits or organomegaly appreciated, bowel sounds nl  MS:  Nl gait/ ext warm without deformities, calf tenderness, cyanosis or clubbing No obvious joint restrictions   SKIN: warm and dry without lesions    NEURO:  alert, approp, nl sensorium with  no motor or cerebellar deficits apparent.          CXR PA and Lateral:   05/17/2018 :    I personally reviewed images and agree with radiology impression as follows:   Stable cardiomediastinal silhouette. No pneumothorax or pleural effusion is noted. Right lung is clear. Lingular linear density is noted which is decreased compared to prior exam and may represent residual atelectasis or scarring. The .            Assessment & Plan:

## 2018-05-17 NOTE — Progress Notes (Signed)
Left detailed msg ok per pt

## 2018-05-17 NOTE — Assessment & Plan Note (Addendum)
-    HCO3 31 01/17/16 - 01/05/2015  Walked RA x 3 laps @ 185 ft each stopped due to  Leg pain and fatibue, no sob , nl pace, no desat  -  12/11/2016 Patient Saturations on Room Air at Rest = 88%----increased 98% 2lpm continuous - HC03  24    03/02/18  - 03/22/2018  Walked RA x 3 laps @ 185 ft each stopped due to  End of study, moderate pace, no   desat  / min sob  - 05/17/2018  Walked 2lpm x 3 laps @ 185 ft each stopped due to  End of study, no desat      Improving with intended wt loss/ approp diuresis to point where 2lpm fine / hypercabia resolved

## 2018-05-17 NOTE — Assessment & Plan Note (Addendum)
-   04/28/2013 PFT's FEV1 1.21 (39%) ratio 59 and 13% better p B2 and dlco 72 corrects to 102  - started spiriva 04/30/2013 > changed to respimat 02/28/2014 -Med calendar 03/14/2014 > not using as of 05/16/14  - arrived on stiolto / symbicort 02/29/2016 > changed to symbicort 80/spiriva (lower dose ICS due to interaction with aids meds  - PFT's  06/17/2016  FEV1 1.29 (42 % ) ratio 64  p 43 % improvement from saba p symb 80 /spiriva prior to study with DLCO  51/49c % corrects to 86  % for alv volume   - 06/17/2016   change symb to 160 2bid  - 09/25/2016  After extensive coaching device  effectiveness =    90% with dpi/ elipta > changed to Incruse per Insurance restrictions > preferred spiriva - 12/11/2016   changed to spiriva respimat  08/05/2017  After extensive coaching HFA effectiveness =  90%    - 09/22/2017 changed to symb 80 2bid with pseudowheeze and thrush  - PFT's  03/22/2018  FEV1 1.34 (45 % ) ratio 63  p 17 % improvement from saba p sym 160/ incruse prior to study with DLCO  36/41 % corrects to 72  % for alv volume     Now more c/w  Group B in terms of symptom/risk and laba/lama therefore appropriate rx at this point and hoarseness could be due to incurse (dpi) or ICS (symb 160) so try bevespi 2 bid but if problems with access or more symptoms, need for saba ok to go back to triple rx (spiriva dpi not option on his plan)    - The proper method of use, as well as anticipated side effects, of a metered-dose inhaler are discussed and demonstrated to the patient using teach back

## 2018-05-17 NOTE — Patient Instructions (Addendum)
No change medications for now - at next refills stop the symbicort and incruse and change to BEVESPI Take 2 puffs first thing in am and then another 2 puffs about 12 hours later to see if helps breathing and voice    The key is to stop smoking completely before smoking completely stops you!    Please remember to go to the  x-ray department downstairs in the basement  for your tests - we will call you with the results when they are available.      Please schedule a follow up visit in 3 months but call sooner if needed

## 2018-05-18 ENCOUNTER — Encounter: Payer: Self-pay | Admitting: Internal Medicine

## 2018-05-18 NOTE — Assessment & Plan Note (Addendum)
Advised again to quit and not mix smoking and 02     I had an extended discussion with the patient reviewing all relevant studies completed to date and  lasting 15 to 20 minutes of a 25 minute visit    See device teaching which extended face to face time for this visit.  Each maintenance medication was reviewed in detail including emphasizing most importantly the difference between maintenance and prns and under what circumstances the prns are to be triggered using an action plan format that is not reflected in the computer generated alphabetically organized AVS which I have not found useful in most complex patients, especially with respiratory illnesses  Please see AVS for specific instructions unique to this visit that I personally wrote and verbalized to the the pt in detail and then reviewed with pt  by my nurse highlighting any  changes in therapy recommended at today's visit to their plan of care.

## 2018-05-28 ENCOUNTER — Emergency Department (HOSPITAL_COMMUNITY)
Admission: EM | Admit: 2018-05-28 | Discharge: 2018-05-28 | Disposition: A | Discharge status: Home / Self Care | Payer: Medicare HMO | Attending: Emergency Medicine | Admitting: Emergency Medicine

## 2018-05-28 ENCOUNTER — Other Ambulatory Visit: Payer: Self-pay

## 2018-05-28 ENCOUNTER — Emergency Department (HOSPITAL_COMMUNITY): Payer: Medicare HMO

## 2018-05-28 DIAGNOSIS — I509 Heart failure, unspecified: Secondary | ICD-10-CM | POA: Insufficient documentation

## 2018-05-28 DIAGNOSIS — M545 Low back pain, unspecified: Secondary | ICD-10-CM

## 2018-05-28 DIAGNOSIS — S199XXA Unspecified injury of neck, initial encounter: Secondary | ICD-10-CM | POA: Diagnosis not present

## 2018-05-28 DIAGNOSIS — R0902 Hypoxemia: Secondary | ICD-10-CM | POA: Diagnosis not present

## 2018-05-28 DIAGNOSIS — I10 Essential (primary) hypertension: Secondary | ICD-10-CM | POA: Insufficient documentation

## 2018-05-28 DIAGNOSIS — Y998 Other external cause status: Secondary | ICD-10-CM | POA: Diagnosis not present

## 2018-05-28 DIAGNOSIS — E119 Type 2 diabetes mellitus without complications: Secondary | ICD-10-CM | POA: Diagnosis not present

## 2018-05-28 DIAGNOSIS — M542 Cervicalgia: Secondary | ICD-10-CM | POA: Diagnosis not present

## 2018-05-28 DIAGNOSIS — J449 Chronic obstructive pulmonary disease, unspecified: Secondary | ICD-10-CM | POA: Insufficient documentation

## 2018-05-28 DIAGNOSIS — Z9981 Dependence on supplemental oxygen: Secondary | ICD-10-CM | POA: Insufficient documentation

## 2018-05-28 DIAGNOSIS — M546 Pain in thoracic spine: Secondary | ICD-10-CM | POA: Diagnosis not present

## 2018-05-28 DIAGNOSIS — B20 Human immunodeficiency virus [HIV] disease: Secondary | ICD-10-CM | POA: Insufficient documentation

## 2018-05-28 DIAGNOSIS — Z79899 Other long term (current) drug therapy: Secondary | ICD-10-CM | POA: Insufficient documentation

## 2018-05-28 DIAGNOSIS — F17210 Nicotine dependence, cigarettes, uncomplicated: Secondary | ICD-10-CM | POA: Insufficient documentation

## 2018-05-28 DIAGNOSIS — S3992XA Unspecified injury of lower back, initial encounter: Secondary | ICD-10-CM | POA: Insufficient documentation

## 2018-05-28 DIAGNOSIS — Y92410 Unspecified street and highway as the place of occurrence of the external cause: Secondary | ICD-10-CM | POA: Insufficient documentation

## 2018-05-28 DIAGNOSIS — S299XXA Unspecified injury of thorax, initial encounter: Secondary | ICD-10-CM | POA: Diagnosis not present

## 2018-05-28 DIAGNOSIS — Y93I9 Activity, other involving external motion: Secondary | ICD-10-CM | POA: Diagnosis not present

## 2018-05-28 DIAGNOSIS — R69 Illness, unspecified: Secondary | ICD-10-CM | POA: Diagnosis not present

## 2018-05-28 MED ORDER — ACETAMINOPHEN 500 MG PO TABS
1000.0000 mg | ORAL_TABLET | Freq: Once | ORAL | Status: AC
  Administered 2018-05-28: 1000 mg via ORAL
  Filled 2018-05-28: qty 2

## 2018-05-28 MED ORDER — METHOCARBAMOL 500 MG PO TABS
500.0000 mg | ORAL_TABLET | Freq: Once | ORAL | Status: AC
  Administered 2018-05-28: 500 mg via ORAL
  Filled 2018-05-28: qty 1

## 2018-05-28 MED ORDER — METHOCARBAMOL 500 MG PO TABS: 500.0000 mg | ORAL_TABLET | Freq: Two times a day (BID) | ORAL | 0 refills | Status: DC
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
## 2018-05-28 NOTE — ED Notes (Signed)
PT DISCHARGED. INSTRUCTIONS. AAOX4. PT IN NO APPARENT DISTRESS WITH SEVERE PAIN. THE OPPORTUNITY TO ASK QUESTIONS WAS PROVIDED.
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
## 2018-05-28 NOTE — Discharge Instructions (Signed)
The pain you are experiencing is likely due to muscle strain, you may take tylenol and Robaxin as needed for pain management. You may also use ice and heat, and over-the-counter remedies such as Biofreeze gel or salon pas lidocaine patches. The muscle soreness should improve over the next week. Follow up with your family doctor in the next week for a recheck if you are still having symptoms. Return to ED if pain is worsening, you develop weakness or numbness of extremities, or new or concerning symptoms develop. ° °

## 2018-06-01 ENCOUNTER — Ambulatory Visit: Payer: Self-pay | Admitting: Nurse Practitioner

## 2018-06-01 DIAGNOSIS — G894 Chronic pain syndrome: Secondary | ICD-10-CM | POA: Diagnosis not present

## 2018-06-01 DIAGNOSIS — G8929 Other chronic pain: Secondary | ICD-10-CM | POA: Diagnosis not present

## 2018-06-01 DIAGNOSIS — Z79899 Other long term (current) drug therapy: Secondary | ICD-10-CM | POA: Diagnosis not present

## 2018-06-01 DIAGNOSIS — G8921 Chronic pain due to trauma: Secondary | ICD-10-CM | POA: Diagnosis not present

## 2018-06-01 DIAGNOSIS — M545 Low back pain: Secondary | ICD-10-CM | POA: Diagnosis not present

## 2018-06-14 ENCOUNTER — Ambulatory Visit: Payer: Self-pay | Admitting: Internal Medicine

## 2018-06-16 DIAGNOSIS — M9903 Segmental and somatic dysfunction of lumbar region: Secondary | ICD-10-CM | POA: Diagnosis not present

## 2018-06-16 DIAGNOSIS — J449 Chronic obstructive pulmonary disease, unspecified: Secondary | ICD-10-CM | POA: Diagnosis not present

## 2018-06-16 DIAGNOSIS — M5416 Radiculopathy, lumbar region: Secondary | ICD-10-CM | POA: Diagnosis not present

## 2018-06-16 DIAGNOSIS — M5032 Other cervical disc degeneration, mid-cervical region, unspecified level: Secondary | ICD-10-CM | POA: Diagnosis not present

## 2018-06-16 DIAGNOSIS — J129 Viral pneumonia, unspecified: Secondary | ICD-10-CM | POA: Diagnosis not present

## 2018-06-16 DIAGNOSIS — M5414 Radiculopathy, thoracic region: Secondary | ICD-10-CM | POA: Diagnosis not present

## 2018-06-16 DIAGNOSIS — M9901 Segmental and somatic dysfunction of cervical region: Secondary | ICD-10-CM | POA: Diagnosis not present

## 2018-06-16 DIAGNOSIS — M9902 Segmental and somatic dysfunction of thoracic region: Secondary | ICD-10-CM | POA: Diagnosis not present

## 2018-06-17 DIAGNOSIS — M5032 Other cervical disc degeneration, mid-cervical region, unspecified level: Secondary | ICD-10-CM | POA: Diagnosis not present

## 2018-06-17 DIAGNOSIS — M9902 Segmental and somatic dysfunction of thoracic region: Secondary | ICD-10-CM | POA: Diagnosis not present

## 2018-06-17 DIAGNOSIS — M5414 Radiculopathy, thoracic region: Secondary | ICD-10-CM | POA: Diagnosis not present

## 2018-06-17 DIAGNOSIS — M9901 Segmental and somatic dysfunction of cervical region: Secondary | ICD-10-CM | POA: Diagnosis not present

## 2018-06-17 DIAGNOSIS — M9903 Segmental and somatic dysfunction of lumbar region: Secondary | ICD-10-CM | POA: Diagnosis not present

## 2018-06-17 DIAGNOSIS — M5416 Radiculopathy, lumbar region: Secondary | ICD-10-CM | POA: Diagnosis not present

## 2018-06-18 ENCOUNTER — Ambulatory Visit: Payer: Medicare HMO | Attending: Nurse Practitioner | Admitting: Nurse Practitioner

## 2018-06-18 ENCOUNTER — Encounter: Payer: Self-pay | Admitting: Nurse Practitioner

## 2018-06-18 VITALS — BP 162/82 | HR 91 | Temp 99.5°F | Ht 69.0 in | Wt 260.6 lb

## 2018-06-18 DIAGNOSIS — B20 Human immunodeficiency virus [HIV] disease: Secondary | ICD-10-CM | POA: Insufficient documentation

## 2018-06-18 DIAGNOSIS — I110 Hypertensive heart disease with heart failure: Secondary | ICD-10-CM | POA: Diagnosis not present

## 2018-06-18 DIAGNOSIS — Z9189 Other specified personal risk factors, not elsewhere classified: Secondary | ICD-10-CM | POA: Diagnosis not present

## 2018-06-18 DIAGNOSIS — I4891 Unspecified atrial fibrillation: Secondary | ICD-10-CM | POA: Insufficient documentation

## 2018-06-18 DIAGNOSIS — Z9981 Dependence on supplemental oxygen: Secondary | ICD-10-CM | POA: Insufficient documentation

## 2018-06-18 DIAGNOSIS — R69 Illness, unspecified: Secondary | ICD-10-CM | POA: Diagnosis not present

## 2018-06-18 DIAGNOSIS — Z794 Long term (current) use of insulin: Secondary | ICD-10-CM | POA: Diagnosis not present

## 2018-06-18 DIAGNOSIS — A630 Anogenital (venereal) warts: Secondary | ICD-10-CM | POA: Diagnosis not present

## 2018-06-18 DIAGNOSIS — IMO0002 Reserved for concepts with insufficient information to code with codable children: Secondary | ICD-10-CM

## 2018-06-18 DIAGNOSIS — E1142 Type 2 diabetes mellitus with diabetic polyneuropathy: Secondary | ICD-10-CM | POA: Insufficient documentation

## 2018-06-18 DIAGNOSIS — Z79899 Other long term (current) drug therapy: Secondary | ICD-10-CM | POA: Insufficient documentation

## 2018-06-18 DIAGNOSIS — Z882 Allergy status to sulfonamides status: Secondary | ICD-10-CM | POA: Insufficient documentation

## 2018-06-18 DIAGNOSIS — A419 Sepsis, unspecified organism: Secondary | ICD-10-CM

## 2018-06-18 DIAGNOSIS — J449 Chronic obstructive pulmonary disease, unspecified: Secondary | ICD-10-CM | POA: Insufficient documentation

## 2018-06-18 DIAGNOSIS — Z7901 Long term (current) use of anticoagulants: Secondary | ICD-10-CM | POA: Insufficient documentation

## 2018-06-18 DIAGNOSIS — I509 Heart failure, unspecified: Secondary | ICD-10-CM | POA: Insufficient documentation

## 2018-06-18 DIAGNOSIS — F329 Major depressive disorder, single episode, unspecified: Secondary | ICD-10-CM | POA: Insufficient documentation

## 2018-06-18 DIAGNOSIS — K219 Gastro-esophageal reflux disease without esophagitis: Secondary | ICD-10-CM | POA: Insufficient documentation

## 2018-06-18 DIAGNOSIS — E118 Type 2 diabetes mellitus with unspecified complications: Secondary | ICD-10-CM | POA: Diagnosis not present

## 2018-06-18 DIAGNOSIS — F419 Anxiety disorder, unspecified: Secondary | ICD-10-CM | POA: Insufficient documentation

## 2018-06-18 LAB — POCT GLYCOSYLATED HEMOGLOBIN (HGB A1C): HEMOGLOBIN A1C: 5.7 % — AB (ref 4.0–5.6)

## 2018-06-18 LAB — GLUCOSE, POCT (MANUAL RESULT ENTRY): POC GLUCOSE: 164 mg/dL — AB (ref 70–99)

## 2018-06-18 MED ORDER — IMIQUIMOD 5 % EX CREA: TOPICAL_CREAM | CUTANEOUS | 3 refills | Status: DC

## 2018-06-18 MED ORDER — NYSTATIN 100000 UNIT/GM EX POWD: Freq: Three times a day (TID) | CUTANEOUS | 1 refills | Status: DC
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
## 2018-06-18 NOTE — Patient Instructions (Signed)
Genital Warts Genital warts are small growths in the genital area or anal area. They are caused by a type of germ (HPV virus). This germ is spread from person to person during sex. It can be spread through vaginal, anal, and oral sex. Genital warts can lead to other problems if they are not treated. Follow these instructions at home: Medicines  Apply over-the-counter and prescription medicines only as told by your doctor.  Do not use medicines that are meant for treating hand warts.  Talk with your doctor about using anti-itch creams. General instructions  Do not touch or scratch the warts.  Do not have sex until your treatment is done.  Tell your current and past sexual partners about your condition. They may need treatment.  Keep all follow-up visits as told by your doctor. This is important.  After treatment, use condoms during sex. Other Instructions for Women  Women who have genital warts may need to be checked more often for cervical cancer.  If you become pregnant, tell your doctor that you have had genital warts. The germ can be passed to the baby. Contact a doctor if:  You have redness, swelling, or pain in the area of the treated skin.  You have a fever.  You feel generally sick.  You feel lumps in and around your genital area or anal area.  You have bleeding in your genital area or anal area.  You have pain during sex. This information is not intended to replace advice given to you by your health care provider. Make sure you discuss any questions you have with your health care provider. Document Released: 10/22/2009 Document Revised: 01/03/2016 Document Reviewed: 10/23/2014 Elsevier Interactive Patient Education  2018 Elsevier Inc.  

## 2018-06-22 ENCOUNTER — Encounter: Payer: Self-pay | Admitting: Internal Medicine

## 2018-06-22 ENCOUNTER — Ambulatory Visit (INDEPENDENT_AMBULATORY_CARE_PROVIDER_SITE_OTHER): Payer: Medicare HMO | Admitting: Internal Medicine

## 2018-06-22 VITALS — BP 131/72 | HR 101 | Temp 98.2°F | Resp 16 | Ht 69.0 in | Wt 259.0 lb

## 2018-06-22 DIAGNOSIS — B20 Human immunodeficiency virus [HIV] disease: Secondary | ICD-10-CM

## 2018-06-22 DIAGNOSIS — Z23 Encounter for immunization: Secondary | ICD-10-CM | POA: Diagnosis not present

## 2018-06-22 DIAGNOSIS — R69 Illness, unspecified: Secondary | ICD-10-CM | POA: Diagnosis not present

## 2018-06-22 DIAGNOSIS — A630 Anogenital (venereal) warts: Secondary | ICD-10-CM

## 2018-06-22 NOTE — Progress Notes (Signed)
RFV: follow up for hiv disease  Patient ID: Chris Round., male   DOB: 08-12-1965, 52 y.o.   MRN: [id number]  HPI 52yo M with hiv disease, cd 4 count of 330/VL<20 in July 2019. He has been doing well in terms of wearing oxygen at night, doesn't feel he is short of breath in the day time. He was Recently seen for genital warts at [facility] given aldara cream to apply topically  Outpatient Encounter Medications as of 06/22/2018  Medication Sig  . albuterol (PROVENTIL HFA;VENTOLIN HFA) 108 (90 Base) MCG/ACT inhaler Inhale 2 puffs into the lungs every 6 (six) hours as needed for wheezing or shortness of breath.  . baclofen (LIORESAL) 10 MG tablet Take 1 tablet by mouth once.  [name] BIKTARVY 50-200-25 MG TABS tablet TAKE 1 TABLET BY MOUTH EVERY DAY  . Blood Glucose Monitoring Suppl (ONETOUCH VERIO) w/Device KIT 1 kit by Does not apply route 2 (two) times daily.  . budesonide-formoterol (SYMBICORT) 160-4.5 MCG/ACT inhaler Inhale 2 puffs into the lungs 2 (two) times daily.  . clindamycin (CLEOCIN) 150 MG capsule Take 1 capsule by mouth 2 (two) times daily.  [name] glucose blood (ONETOUCH VERIO) test strip Use as instructed  . Glycopyrrolate-Formoterol (BEVESPI AEROSPHERE) 9-4.8 MCG/ACT AERO Inhale 2 puffs into the lungs 2 (two) times daily.  . imiquimod (ALDARA) 5 % cream Apply 3 times per week prior to bedtime; leave on for 6-10 hours, then remove with soap and water. Continue until total clearance of warts.  . INCRUSE ELLIPTA 62.5 MCG/INH AEPB Take 2 puffs by mouth daily.  . metFORMIN (GLUCOPHAGE) 1000 MG tablet Take 1 tablet (1,000 mg total) by mouth daily with breakfast. (Patient taking differently: Take 1,000 mg by mouth 2 (two) times daily with a meal. )  . methocarbamol (ROBAXIN) 500 MG tablet Take 1 tablet (500 mg total) by mouth 2 (two) times daily.  . Misc. Devices MISC Please provide patient with a portable oximeter. Diagnosis: COPD GOLD III  . nystatin (MYCOSTATIN/NYSTOP)  powder Apply topically 3 (three) times daily.  Glory Rosebush DELICA LANCETS 38G MISC Use as instructed  . oxyCODONE (OXY IR/ROXICODONE) 5 MG immediate release tablet Take 1 tablet (5 mg total) by mouth 3 (three) times daily as needed for up to 15 doses for moderate pain or severe pain (pain).  . OXYGEN Inhale 2 L into the lungs as needed (shortness of breath).  . sildenafil (VIAGRA) 25 MG tablet Take 1 tablet (25 mg total) by mouth daily as needed for erectile dysfunction.  [name] apixaban (ELIQUIS) 5 MG TABS tablet Take 1 tablet (5 mg total) by mouth 2 (two) times daily.  [name] gabapentin (NEURONTIN) 300 MG capsule Take 2 capsules (600 mg total) by mouth 3 (three) times daily.  . hydrALAZINE (APRESOLINE) 50 MG tablet Take 1.5 tablets (75 mg total) by mouth every 8 (eight) hours.   No facility-administered encounter medications on file as of 06/22/2018.      Patient Active Problem List   Diagnosis Date Noted  . Acute and chronic respiratory failure with hypoxia  01/21/2018  . Left lower lobe pneumonia/HCAP 01/21/2018  . Human immunodeficiency virus (HIV) disease /CD4 60 May 2019 01/21/2018  . COPD (chronic obstructive pulmonary disease) ([place]) 01/21/2018  . Diabetes mellitus type 2, insulin dependent ([place]) 01/21/2018  . Hypertension/chronic diastolic heart failure 53/64/6803  . Paroxysmal atrial fibrillation ([place]) 01/21/2018  . Physical deconditioning 01/21/2018  . Sepsis without acute organ dysfunction ([place]) 01/21/2018  . HCAP (healthcare-associated  pneumonia) 01/14/2018  . Esophageal candidiasis ([place])   . Dysphasia   . Anemia   . Evaluation by psychiatric service required   . Goals of care, counseling/discussion   . Elevated troponin   . AKI (acute kidney injury) ([place])   . Endotracheally intubated   . Diabetes mellitus type 2 in obese ([place]) 10/01/2017  . Atrial fibrillation ([place]) 10/01/2017  . HIV (human immunodeficiency virus infection) ([place]) 10/01/2017  . Chronic diastolic heart failure  ([place])- exacerbated by Afib. 09/25/2017  . PAF (paroxysmal atrial fibrillation) ([place]) 09/25/2017  . Chest pain with moderate risk for cardiac etiology 09/25/2017  . Depression 07/21/2017  . GERD (gastroesophageal reflux disease) 07/21/2017  . Acute on chronic respiratory failure with hypoxia ([place]) 07/21/2017  . Dental caries 06/01/2017  . Special screening for malignant neoplasms, colon   . Dysphagia   . Candida esophagitis ([place])   . Candidiasis of mouth 03/27/2016  . Adjustment disorder with depressed mood 01/04/2016  . Callus of foot 01/04/2016  . Hoarseness of voice 10/04/2015  . Cigarette nicotine dependence without complication 25/00/3704  . Primary osteoarthritis of right knee 03/26/2015  . Genital warts 01/22/2015  . Other male erectile dysfunction 01/22/2015  . Essential hypertension, benign 12/04/2014  . Atopic eczema 12/04/2014  . COPD exacerbation ([place]) 08/23/2013  . Cyst (solitary) of breast 08/23/2013  . Obstructive sleep apnea syndrome 08/23/2013  . Cigarette smoker 02/17/2013  . Steroid-induced hyperglycemia 01/12/2013  . Chronic respiratory failure with hypoxia and hypercapnia ([place]) 01/12/2013  . COPD  GOLD III, still smoking 01/11/2013  . HIV disease ([place]) 12/06/2012     Health Maintenance Due  Topic Date Due  . FOOT EXAM  04/12/1976  . OPHTHALMOLOGY EXAM  04/12/1976  . TETANUS/TDAP  04/12/1985  . URINE MICROALBUMIN  11/12/2017  . INFLUENZA VACCINE  03/11/2018     Review of Systems Tenderness to buttock/scrotum from rash. Baseline shortness of breath is unchanged. 12 point ros is otherwise negative Physical Exam   BP 131/72   Pulse (!) 101   Temp 98.2 F (36.8 C)   Resp 16   Ht '5\' 9"'  (1.753 m)   Wt 259 lb (117.5 kg)   SpO2 (!) 88% Comment: room air  BMI 38.25 kg/m     gen = a x o by 4  HEENT= MMM, OP clear, eomi, no scleral icterus Cors = distant heart sounds, nl s1,s2 no g/m/r Pulm= CTAB no w/c/r GU= Roughly a dozen of large perianal  lesions and a few extending to base of scrotum Lab Results  Component Value Date   CD4TCELL 10 (L) 03/03/2018   Lab Results  Component Value Date   CD4TABS 330 (L) 03/03/2018   CD4TABS 210 (L) 01/22/2018   CD4TABS 60 (L) 12/15/2017   Lab Results  Component Value Date   HIV1RNAQUANT <20 NOT DETECTED 03/03/2018   Lab Results  Component Value Date   HEPBSAB Reactive 10/11/2017   Lab Results  Component Value Date   LABRPR NON REAC 06/23/2016    CBC Lab Results  Component Value Date   WBC 10.3 03/03/2018   RBC 3.96 (L) 03/03/2018   HGB 11.1 (L) 03/03/2018   HCT 34.2 (L) 03/03/2018   PLT 290 03/03/2018   MCV 86.4 03/03/2018   MCH 28.0 03/03/2018   MCHC 32.5 03/03/2018   RDW 13.0 03/03/2018   LYMPHSABS 3,502 03/03/2018   MONOABS 1.6 (H) 01/21/2018   EOSABS 258 03/03/2018    BMET Lab Results  Component Value Date  NA 139 03/02/2018   K 3.8 03/02/2018   CL 100 03/02/2018   CO2 24 03/02/2018   GLUCOSE 100 (H) 03/02/2018   BUN 14 03/02/2018   CREATININE 1.22 03/02/2018   CALCIUM 9.5 03/02/2018   GFRNONAA 68 03/02/2018   GFRAA 79 03/02/2018      Assessment and Plan  Multiple anal condylomata = recommend to refer to general surgery for management. I suspect that topical cream won't do enough  hiv disease = well controlled. Will check cd 4 count and viral load  Health maintenance = will give flu vaccine plus 2nd meningococcal dose now and will give pneumovax 23 booster at next visit

## 2018-06-22 NOTE — Progress Notes (Signed)
Received Menveo#2, and Flu vaccine today. Tolerated well.   Patient to receive  Pneumovax 23 at next visit per Dr. Drue SecondSnider

## 2018-06-23 LAB — T-HELPER CELL (CD4) - (RCID CLINIC ONLY)
CD4 % Helper T Cell: 8 % — ABNORMAL LOW (ref 33–55)
CD4 T Cell Abs: 180 /uL — ABNORMAL LOW (ref 400–2700)

## 2018-06-24 LAB — HIV-1 RNA QUANT-NO REFLEX-BLD
HIV 1 RNA Quant: 24 copies/mL — ABNORMAL HIGH
HIV-1 RNA Quant, Log: 1.38 Log copies/mL — ABNORMAL HIGH

## 2018-06-24 LAB — RPR: RPR Ser Ql: NONREACTIVE

## 2018-07-02 DIAGNOSIS — Z79899 Other long term (current) drug therapy: Secondary | ICD-10-CM | POA: Diagnosis not present

## 2018-07-02 DIAGNOSIS — R69 Illness, unspecified: Secondary | ICD-10-CM | POA: Diagnosis not present

## 2018-07-02 DIAGNOSIS — M5136 Other intervertebral disc degeneration, lumbar region: Secondary | ICD-10-CM | POA: Diagnosis not present

## 2018-07-02 DIAGNOSIS — G8921 Chronic pain due to trauma: Secondary | ICD-10-CM | POA: Diagnosis not present

## 2018-07-07 ENCOUNTER — Encounter: Payer: Self-pay | Admitting: Podiatry

## 2018-07-07 ENCOUNTER — Ambulatory Visit (INDEPENDENT_AMBULATORY_CARE_PROVIDER_SITE_OTHER): Payer: Medicare HMO | Admitting: Podiatry

## 2018-07-07 VITALS — BP 103/68

## 2018-07-07 DIAGNOSIS — E1142 Type 2 diabetes mellitus with diabetic polyneuropathy: Secondary | ICD-10-CM | POA: Diagnosis not present

## 2018-07-07 DIAGNOSIS — B351 Tinea unguium: Secondary | ICD-10-CM

## 2018-07-07 DIAGNOSIS — M79676 Pain in unspecified toe(s): Secondary | ICD-10-CM | POA: Diagnosis not present

## 2018-07-07 NOTE — Patient Instructions (Addendum)
Diabetes and Foot Care Diabetes may cause you to have problems because of poor blood supply (circulation) to your feet and legs. This may cause the skin on your feet to become thinner, break easier, and heal more slowly. Your skin may become dry, and the skin may peel and crack. You may also have nerve damage in your legs and feet causing decreased feeling in them. You may not notice minor injuries to your feet that could lead to infections or more serious problems. Taking care of your feet is one of the most important things you can do for yourself. Follow these instructions at home:  Wear shoes at all times, even in the house. Do not go barefoot. Bare feet are easily injured.  Check your feet daily for blisters, cuts, and redness. If you cannot see the bottom of your feet, use a mirror or ask someone for help.  Wash your feet with warm water (do not use hot water) and mild soap. Then pat your feet and the areas between your toes until they are completely dry. Do not soak your feet as this can dry your skin.  Apply a moisturizing lotion or petroleum jelly (that does not contain alcohol and is unscented) to the skin on your feet and to dry, brittle toenails. Do not apply lotion between your toes.  Trim your toenails straight across. Do not dig under them or around the cuticle. File the edges of your nails with an emery board or nail file.  Do not cut corns or calluses or try to remove them with medicine.  Wear clean socks or stockings every day. Make sure they are not too tight. Do not wear knee-high stockings since they may decrease blood flow to your legs.  Wear shoes that fit properly and have enough cushioning. To break in new shoes, wear them for just a few hours a day. This prevents you from injuring your feet. Always look in your shoes before you put them on to be sure there are no objects inside.  Do not cross your legs. This may decrease the blood flow to your feet.  If you find a  minor scrape, cut, or break in the skin on your feet, keep it and the skin around it clean and dry. These areas may be cleansed with mild soap and water. Do not cleanse the area with peroxide, alcohol, or iodine.  When you remove an adhesive bandage, be sure not to damage the skin around it.  If you have a wound, look at it several times a day to make sure it is healing.  Do not use heating pads or hot water bottles. They may burn your skin. If you have lost feeling in your feet or legs, you may not know it is happening until it is too late.  Make sure your health care provider performs a complete foot exam at least annually or more often if you have foot problems. Report any cuts, sores, or bruises to your health care provider immediately. Contact a health care provider if:  You have an injury that is not healing.  You have cuts or breaks in the skin.  You have an ingrown nail.  You notice redness on your legs or feet.  You feel burning or tingling in your legs or feet.  You have pain or cramps in your legs and feet.  Your legs or feet are numb.  Your feet always feel cold. Get help right away if:  There is increasing   redness, swelling, or pain in or around a wound.  There is a red line that goes up your leg.  Pus is coming from a wound.  You develop a fever or as directed by your health care provider.  You notice a bad smell coming from an ulcer or wound. This information is not intended to replace advice given to you by your health care provider. Make sure you discuss any questions you have with your health care provider. Document Released: 07/25/2000 Document Revised: 01/03/2016 Document Reviewed: 01/04/2013 Elsevier Interactive Patient Education  2017 [employer] Onychomycosis/Fungal Toenails  WHAT IS IT? An infection that lies within the keratin of your nail plate that is caused by a fungus.  WHY ME? Fungal infections affect all ages, sexes, races, and creeds.   There may be many factors that predispose you to a fungal infection such as age, coexisting medical conditions such as diabetes, or an autoimmune disease; stress, medications, fatigue, genetics, etc.  Bottom line: fungus thrives in a warm, moist environment and your shoes offer such a location.  IS IT CONTAGIOUS? Theoretically, yes.  You do not want to share shoes, nail clippers or files with someone who has fungal toenails.  Walking around barefoot in the same room or sleeping in the same bed is unlikely to transfer the organism.  It is important to realize, however, that fungus can spread easily from one nail to the next on the same foot.  HOW DO WE TREAT THIS?  There are several ways to treat this condition.  Treatment may depend on many factors such as age, medications, pregnancy, liver and kidney conditions, etc.  It is best to ask your doctor which options are available to you.  1. No treatment.   Unlike many other medical concerns, you can live with this condition.  However for many people this can be a painful condition and may lead to ingrown toenails or a bacterial infection.  It is recommended that you keep the nails cut short to help reduce the amount of fungal nail. 2. Topical treatment.  These range from herbal remedies to prescription strength nail lacquers.  About 40-50% effective, topicals require twice daily application for approximately 9 to 12 months or until an entirely new nail has grown out.  The most effective topicals are medical grade medications available through physicians offices. 3. Oral antifungal medications.  With an 80-90% cure rate, the most common oral medication requires 3 to 4 months of therapy and stays in your system for a year as the new nail grows out.  Oral antifungal medications do require blood work to make sure it is a safe drug for you.  A liver function panel will be performed prior to starting the medication and after the first month of treatment.  It is  important to have the blood work performed to avoid any harmful side effects.  In general, this medication safe but blood work is required. 4. Laser Therapy.  This treatment is performed by applying a specialized laser to the affected nail plate.  This therapy is noninvasive, fast, and non-painful.  It is not covered by insurance and is therefore, out of pocket.  The results have been very good with a 80-95% cure rate.  [facility] is the only practice in the area to offer this therapy. 5. Permanent Nail Avulsion.  Removing the entire nail so that a new nail will not grow back.  Diabetic Neuropathy Diabetic neuropathy is a nerve disease or nerve  damage that is caused by diabetes mellitus. About half of all people with diabetes mellitus have some form of nerve damage. Nerve damage is more common in those who have had diabetes mellitus for many years and who generally have not had good control of their blood sugar (glucose) level. Diabetic neuropathy is a common complication of diabetes mellitus. There are three common types of diabetic neuropathy and a fourth type that is less common and less understood:  Peripheral neuropathy-This is the most common type of diabetic neuropathy. It causes damage to the nerves of the feet and legs first and then eventually the hands and arms. The damage affects the ability to sense touch.  Autonomic neuropathy-This type causes damage to the autonomic nervous system, which controls the following functions: ? Heartbeat. ? Body temperature. ? Blood pressure. ? Urination. ? Digestion. ? Sweating. ? Sexual function.  Focal neuropathy-Focal neuropathy can be painful and unpredictable and occurs most often in older adults with diabetes mellitus. It involves a specific nerve or one area and often comes on suddenly. It usually does not cause long-term problems.  Radiculoplexus neuropathy- Sometimes called lumbosacral radiculoplexus neuropathy, radiculoplexus  neuropathy affects the nerves of the thighs, hips, buttocks, or legs. It is more common in people with type 2 diabetes mellitus and in older men. It is characterized by debilitating pain, weakness, and atrophy, usually in the thigh muscles.  What are the causes? The cause of peripheral, autonomic, and focal neuropathies is diabetes mellitus that is uncontrolled and high glucose levels. The cause of radiculoplexus neuropathy is unknown. However, it is thought to be caused by inflammation related to uncontrolled glucose levels. What are the signs or symptoms? Peripheral Neuropathy Peripheral neuropathy develops slowly over time. When the nerves of the feet and legs no longer work there may be:  Burning, stabbing, or aching pain in the legs or feet.  Inability to feel pressure or pain in your feet. This can lead to: ? Thick calluses over pressure areas. ? Pressure sores. ? Ulcers.  Foot deformities.  Reduced ability to feel temperature changes.  Muscle weakness.  Autonomic Neuropathy The symptoms of autonomic neuropathy vary depending on which nerves are affected. Symptoms may include:  Problems with digestion, such as: ? Feeling sick to your stomach (nausea). ? Vomiting. ? Bloating. ? Constipation. ? Diarrhea. ? Abdominal pain.  Difficulty with urination. This occurs if you lose your ability to sense when your bladder is full. Problems include: ? Urine leakage (incontinence). ? Inability to empty your bladder completely (retention).  Rapid or irregular heartbeat (palpitations).  Blood pressure drops when you stand up (orthostatic hypotension). When you stand up you may feel: ? Dizzy. ? Weak. ? Faint.  In men, inability to attain and maintain an erection.  In women, vaginal dryness and problems with decreased sexual desire and arousal.  Problems with body temperature regulation.  Increased or decreased sweating.  Focal Neuropathy  Abnormal eye movements or abnormal  alignment of both eyes.  Weakness in the wrist.  Foot drop. This results in an inability to lift the foot properly and abnormal walking or foot movement.  Paralysis on one side of your face (Bell palsy).  Chest or abdominal pain. Radiculoplexus Neuropathy  Sudden, severe pain in your hip, thigh, or buttocks.  Weakness and wasting of thigh muscles.  Difficulty rising from a seated position.  Abdominal swelling.  Unexplained weight loss (usually more than 10 lb [4.5 kg]). How is this diagnosed? Peripheral Neuropathy Your senses may be tested.  Sensory function testing can be done with:  A light touch using a monofilament.  A vibration with tuning fork.  A sharp sensation with a pin prick.  Other tests that can help diagnose neuropathy are:  Nerve conduction velocity. This test checks the transmission of an electrical current through a nerve.  Electromyography. This shows how muscles respond to electrical signals transmitted by nearby nerves.  Quantitative sensory testing. This is used to assess how your nerves respond to vibrations and changes in temperature.  Autonomic Neuropathy Diagnosis is often based on reported symptoms. Tell your health care provider if you experience:  Dizziness.  Constipation.  Diarrhea.  Inappropriate urination or inability to urinate.  Inability to get or maintain an erection.  Tests that may be done include:  Electrocardiography or Holter monitor. These are tests that can help show problems with the heart rate or heart rhythm.  An X-ray exam may be done.  Focal Neuropathy Diagnosis is made based on your symptoms and what your health care provider finds during your exam. Other tests may be done. They may include:  Nerve conduction velocities. This checks the transmission of electrical current through a nerve.  Electromyography. This shows how muscles respond to electrical signals transmitted by nearby nerves.  Quantitative  sensory testing. This test is used to assess how your nerves respond to vibration and changes in temperature.  Radiculoplexus Neuropathy  Often the first thing is to eliminate any other issue or problems that might be the cause, as there is no standard test for diagnosis.  X-ray exam of your spine and lumbar region.  Spinal tap to rule out cancer.  MRI to rule out other lesions. How is this treated? Once nerve damage occurs, it cannot be reversed. The goal of treatment is to keep the disease or nerve damage from getting worse and affecting more nerve fibers. Controlling your blood glucose level is the key. Most people with radiculoplexus neuropathy see at least a partial improvement over time. You will need to keep your blood glucose and HbA1c levels in the target range determined by your health care provider. Things that help control blood glucose levels include:  Blood glucose monitoring.  Meal planning.  Physical activity.  Diabetes medicine.  Over time, maintaining lower blood glucose levels helps lessen symptoms. Sometimes, prescription pain medicine is needed. Follow these instructions at home:  Do not smoke.  Keep your blood glucose level in the range that you and your health care provider have determined acceptable for you.  Keep your blood pressure level in the range that you and your health care provider have determined acceptable for you.  Eat a well-balanced diet.  Be physically active every day. Include strength training and balance exercises.  Protect your feet. ? Check your feet every day for sores, cuts, blisters, or signs of infection. ? Wear padded socks and supportive shoes. Use orthotic inserts, if necessary. ? Regularly check the insides of your shoes for worn spots. Make sure there are no rocks or other items inside your shoes before you put them on. Contact a health care provider if:  You have burning, stabbing, or aching pain in the legs or  feet.  You are unable to feel pressure or pain in your feet.  You develop problems with digestion such as: ? Nausea. ? Vomiting. ? Bloating. ? Constipation. ? Diarrhea. ? Abdominal pain.  You have difficulty with urination, such as: ? Incontinence. ? Retention.  You have palpitations.  You develop orthostatic hypotension.  When you stand up you may feel: ? Dizzy. ? Weak. ? Faint.  You cannot attain and maintain an erection (in men).  You have vaginal dryness and problems with decreased sexual desire and arousal (in women).  You have severe pain in your thighs, legs, or buttocks.  You have unexplained weight loss. This information is not intended to replace advice given to you by your health care provider. Make sure you discuss any questions you have with your health care provider. Document Released: 10/06/2001 Document Revised: 01/03/2016 Document Reviewed: 01/06/2013 Elsevier Interactive Patient Education  2017 [employer].

## 2018-07-12 ENCOUNTER — Other Ambulatory Visit: Payer: Self-pay | Admitting: *Deleted

## 2018-07-12 MED ORDER — ALBUTEROL SULFATE HFA 108 (90 BASE) MCG/ACT IN AERS: 2.0000 | INHALATION_SPRAY | Freq: Four times a day (QID) | RESPIRATORY_TRACT | 5 refills | Status: DC | PRN

## 2018-07-13 ENCOUNTER — Ambulatory Visit: Payer: Self-pay | Admitting: General Surgery

## 2018-07-13 DIAGNOSIS — R69 Illness, unspecified: Secondary | ICD-10-CM | POA: Diagnosis not present

## 2018-07-15 IMAGING — DX DG CHEST 2V
2 series · 2 of 2 positions shown · non-contrast
Comparison: Chest CT July 23, 2017; chest radiograph July 23, 2017

CLINICAL DATA: Cough and shortness of breath.  Chest pain

EXAM:
CHEST  2 VIEW

[chest lat]
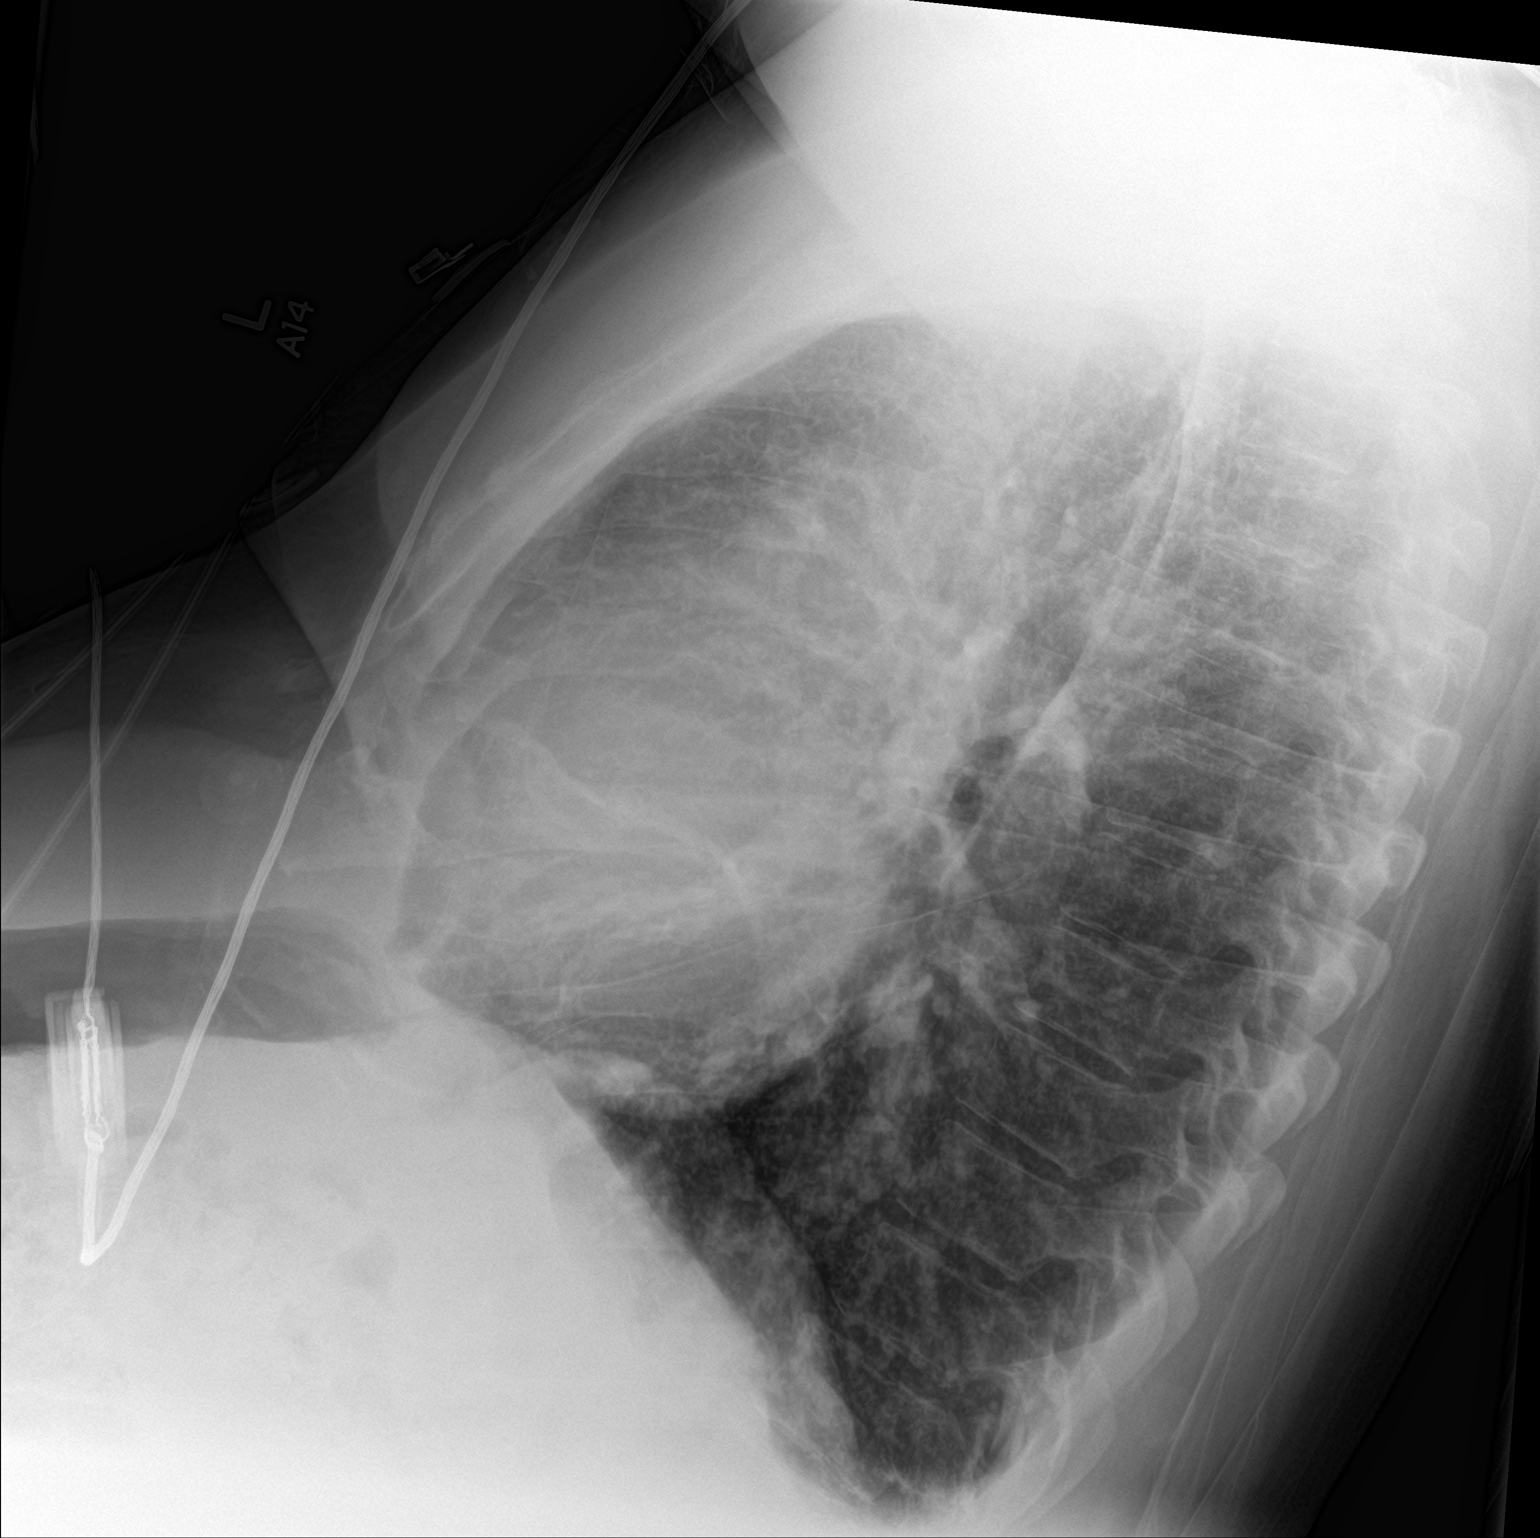

[chest ap]
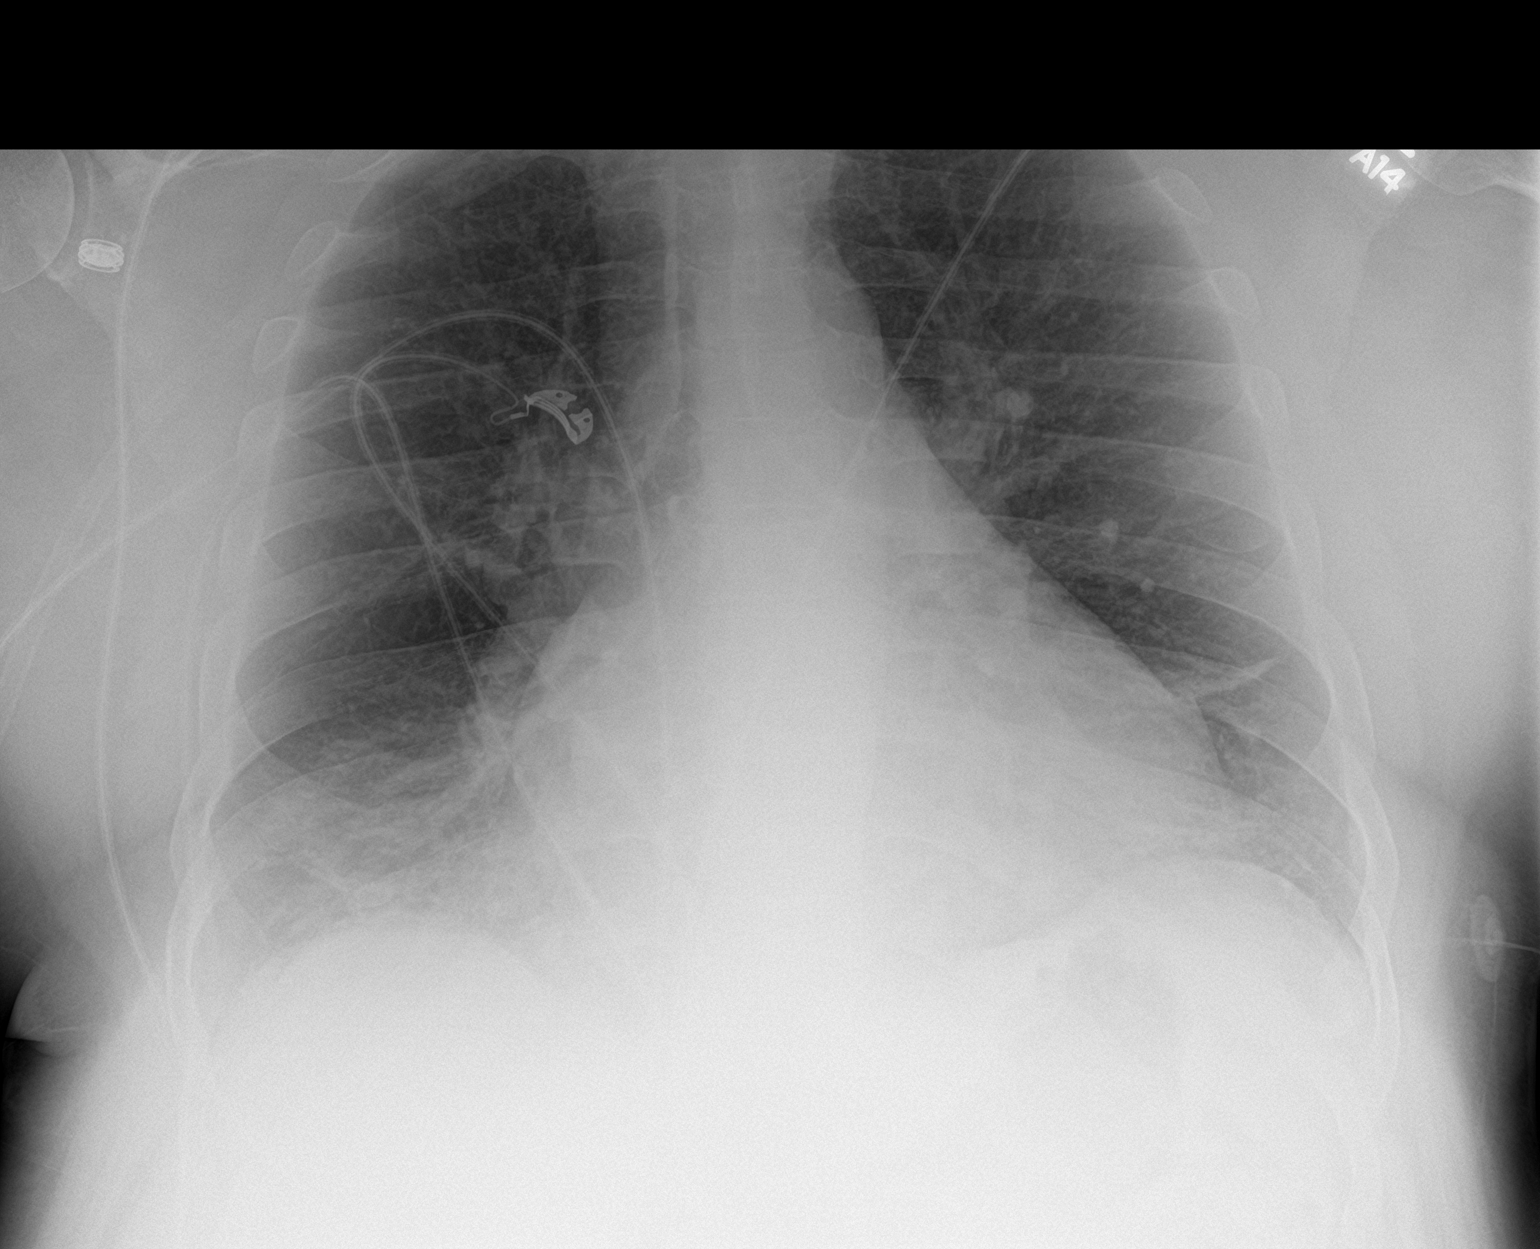

[2 of 2 positions shown; findings below may reference images not displayed]

FINDINGS: There is bibasilar atelectatic change. There is no frank edema or
consolidation. Heart is mildly enlarged with pulmonary vascularity
within normal limits. No adenopathy. No bone lesions. No
pneumothorax.
IMPRESSION: Mild cardiomegaly. Bibasilar atelectasis. No frank edema or
consolidation.

## 2018-07-16 ENCOUNTER — Other Ambulatory Visit: Payer: Self-pay | Admitting: Nurse Practitioner

## 2018-07-16 ENCOUNTER — Other Ambulatory Visit: Payer: Self-pay

## 2018-07-16 ENCOUNTER — Telehealth: Payer: Self-pay | Admitting: Nurse Practitioner

## 2018-07-16 ENCOUNTER — Encounter: Payer: Self-pay | Admitting: Nurse Practitioner

## 2018-07-16 ENCOUNTER — Ambulatory Visit: Payer: Medicare HMO | Attending: Nurse Practitioner | Admitting: Nurse Practitioner

## 2018-07-16 VITALS — BP 128/74 | HR 90 | Temp 98.3°F | Resp 20 | Wt 262.0 lb

## 2018-07-16 DIAGNOSIS — J449 Chronic obstructive pulmonary disease, unspecified: Secondary | ICD-10-CM | POA: Diagnosis not present

## 2018-07-16 DIAGNOSIS — J209 Acute bronchitis, unspecified: Secondary | ICD-10-CM | POA: Insufficient documentation

## 2018-07-16 DIAGNOSIS — Z7901 Long term (current) use of anticoagulants: Secondary | ICD-10-CM | POA: Diagnosis not present

## 2018-07-16 DIAGNOSIS — Z881 Allergy status to other antibiotic agents status: Secondary | ICD-10-CM | POA: Insufficient documentation

## 2018-07-16 DIAGNOSIS — E118 Type 2 diabetes mellitus with unspecified complications: Secondary | ICD-10-CM

## 2018-07-16 DIAGNOSIS — Z794 Long term (current) use of insulin: Secondary | ICD-10-CM

## 2018-07-16 DIAGNOSIS — J440 Chronic obstructive pulmonary disease with acute lower respiratory infection: Secondary | ICD-10-CM

## 2018-07-16 DIAGNOSIS — J129 Viral pneumonia, unspecified: Secondary | ICD-10-CM | POA: Diagnosis not present

## 2018-07-16 DIAGNOSIS — F329 Major depressive disorder, single episode, unspecified: Secondary | ICD-10-CM | POA: Insufficient documentation

## 2018-07-16 DIAGNOSIS — I509 Heart failure, unspecified: Secondary | ICD-10-CM | POA: Insufficient documentation

## 2018-07-16 DIAGNOSIS — Z7951 Long term (current) use of inhaled steroids: Secondary | ICD-10-CM | POA: Diagnosis not present

## 2018-07-16 DIAGNOSIS — Z7984 Long term (current) use of oral hypoglycemic drugs: Secondary | ICD-10-CM | POA: Insufficient documentation

## 2018-07-16 DIAGNOSIS — E119 Type 2 diabetes mellitus without complications: Secondary | ICD-10-CM | POA: Diagnosis not present

## 2018-07-16 DIAGNOSIS — I4891 Unspecified atrial fibrillation: Secondary | ICD-10-CM | POA: Diagnosis not present

## 2018-07-16 DIAGNOSIS — Z79899 Other long term (current) drug therapy: Secondary | ICD-10-CM | POA: Diagnosis not present

## 2018-07-16 DIAGNOSIS — R69 Illness, unspecified: Secondary | ICD-10-CM | POA: Diagnosis not present

## 2018-07-16 DIAGNOSIS — I110 Hypertensive heart disease with heart failure: Secondary | ICD-10-CM | POA: Insufficient documentation

## 2018-07-16 DIAGNOSIS — K219 Gastro-esophageal reflux disease without esophagitis: Secondary | ICD-10-CM | POA: Insufficient documentation

## 2018-07-16 DIAGNOSIS — F419 Anxiety disorder, unspecified: Secondary | ICD-10-CM | POA: Insufficient documentation

## 2018-07-16 DIAGNOSIS — Z21 Asymptomatic human immunodeficiency virus [HIV] infection status: Secondary | ICD-10-CM | POA: Insufficient documentation

## 2018-07-16 IMAGING — DX DG CHEST 1V PORT
1 series · 1 of 1 positions shown · non-contrast
Comparison: Chest radiograph dated 10/01/2017

CLINICAL DATA: 51-year-old male status post intubation.

EXAM:
PORTABLE CHEST 1 VIEW

[chest ap]
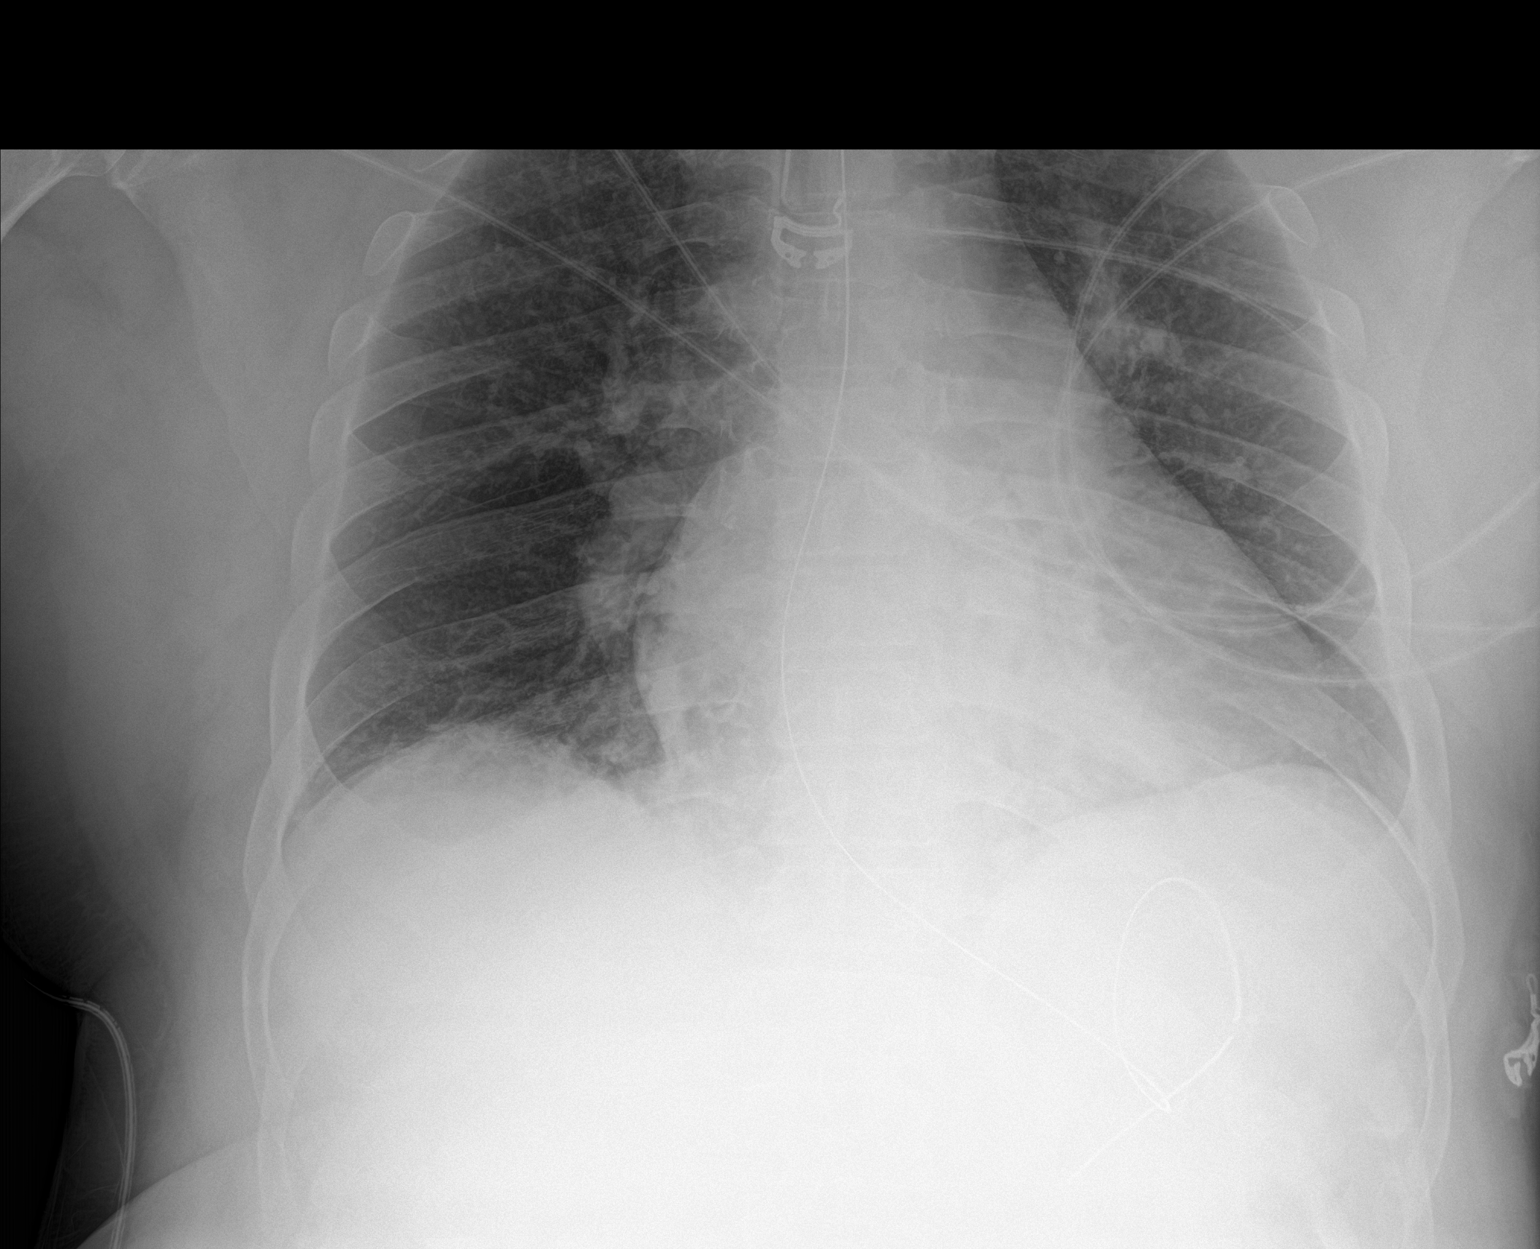

[1 of 1 positions shown; findings below may reference images not displayed]

FINDINGS: Endotracheal tube with tip approximately 4.5 cm above the carina.
The enteric tube extends into the left upper abdomen. There is a
focal area of kink or looping of the tube. The side port and tip of
the tube all located in the proximal to mid stomach.

Minimal bibasilar atelectatic changes. No focal consolidation,
pleural effusion, or pneumothorax. Mild cardiomegaly. No acute
osseous pathology.
IMPRESSION: 1. Endotracheal tube above the carina.
2. Enteric tube extends into the left upper abdomen with tip and
side-port likely in the proximal to mid stomach.
3. Cardiomegaly with bibasilar atelectatic changes.

## 2018-07-16 MED ORDER — GUAIFENESIN-DM 100-10 MG/5ML PO SYRP: 5.0000 mL | ORAL_SOLUTION | ORAL | 0 refills | Status: DC | PRN

## 2018-07-16 MED ORDER — BENZONATATE 200 MG PO CAPS: 200.0000 mg | ORAL_CAPSULE | Freq: Two times a day (BID) | ORAL | 0 refills | Status: DC | PRN

## 2018-07-16 MED ORDER — PREDNISONE 50 MG PO TABS: 50.0000 mg | ORAL_TABLET | Freq: Every day | ORAL | 0 refills | Status: AC

## 2018-07-16 MED ORDER — GABAPENTIN 300 MG PO CAPS: 600.0000 mg | ORAL_CAPSULE | Freq: Three times a day (TID) | ORAL | 2 refills | Status: DC

## 2018-07-16 MED ORDER — AZITHROMYCIN 250 MG PO TABS: 500.0000 mg | ORAL_TABLET | Freq: Every day | ORAL | 0 refills | Status: AC

## 2018-07-16 NOTE — Progress Notes (Signed)
Cold sx's: White nasal discharge Intermittent SOB Per pt. Sx's ongoing since last OV11/03/2018

## 2018-07-16 NOTE — Telephone Encounter (Signed)
1) Medication(s) Requested (by name):gabapentin (NEURONTIN) 300 MG capsule [[id number]][[id number]] ENDED    2) Pharmacy of Choice: [facility]   3) Special Requests:   Approved medications will be sent to the pharmacy, we will reach out if there is an issue.  Requests made after 3pm may not be addressed until the following business day!  If a patient is unsure of the name of the medication(s) please note and ask patient to call back when they are able to provide all info, do not send to responsible party until all information is available!

## 2018-07-16 NOTE — Patient Instructions (Signed)
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
## 2018-07-17 IMAGING — DX DG CHEST 1V PORT
1 series · 1 of 1 positions shown · non-contrast
Comparison: Chest x-ray from yesterday.

CLINICAL DATA: Dyspnea.

EXAM:
PORTABLE CHEST 1 VIEW

[chest]
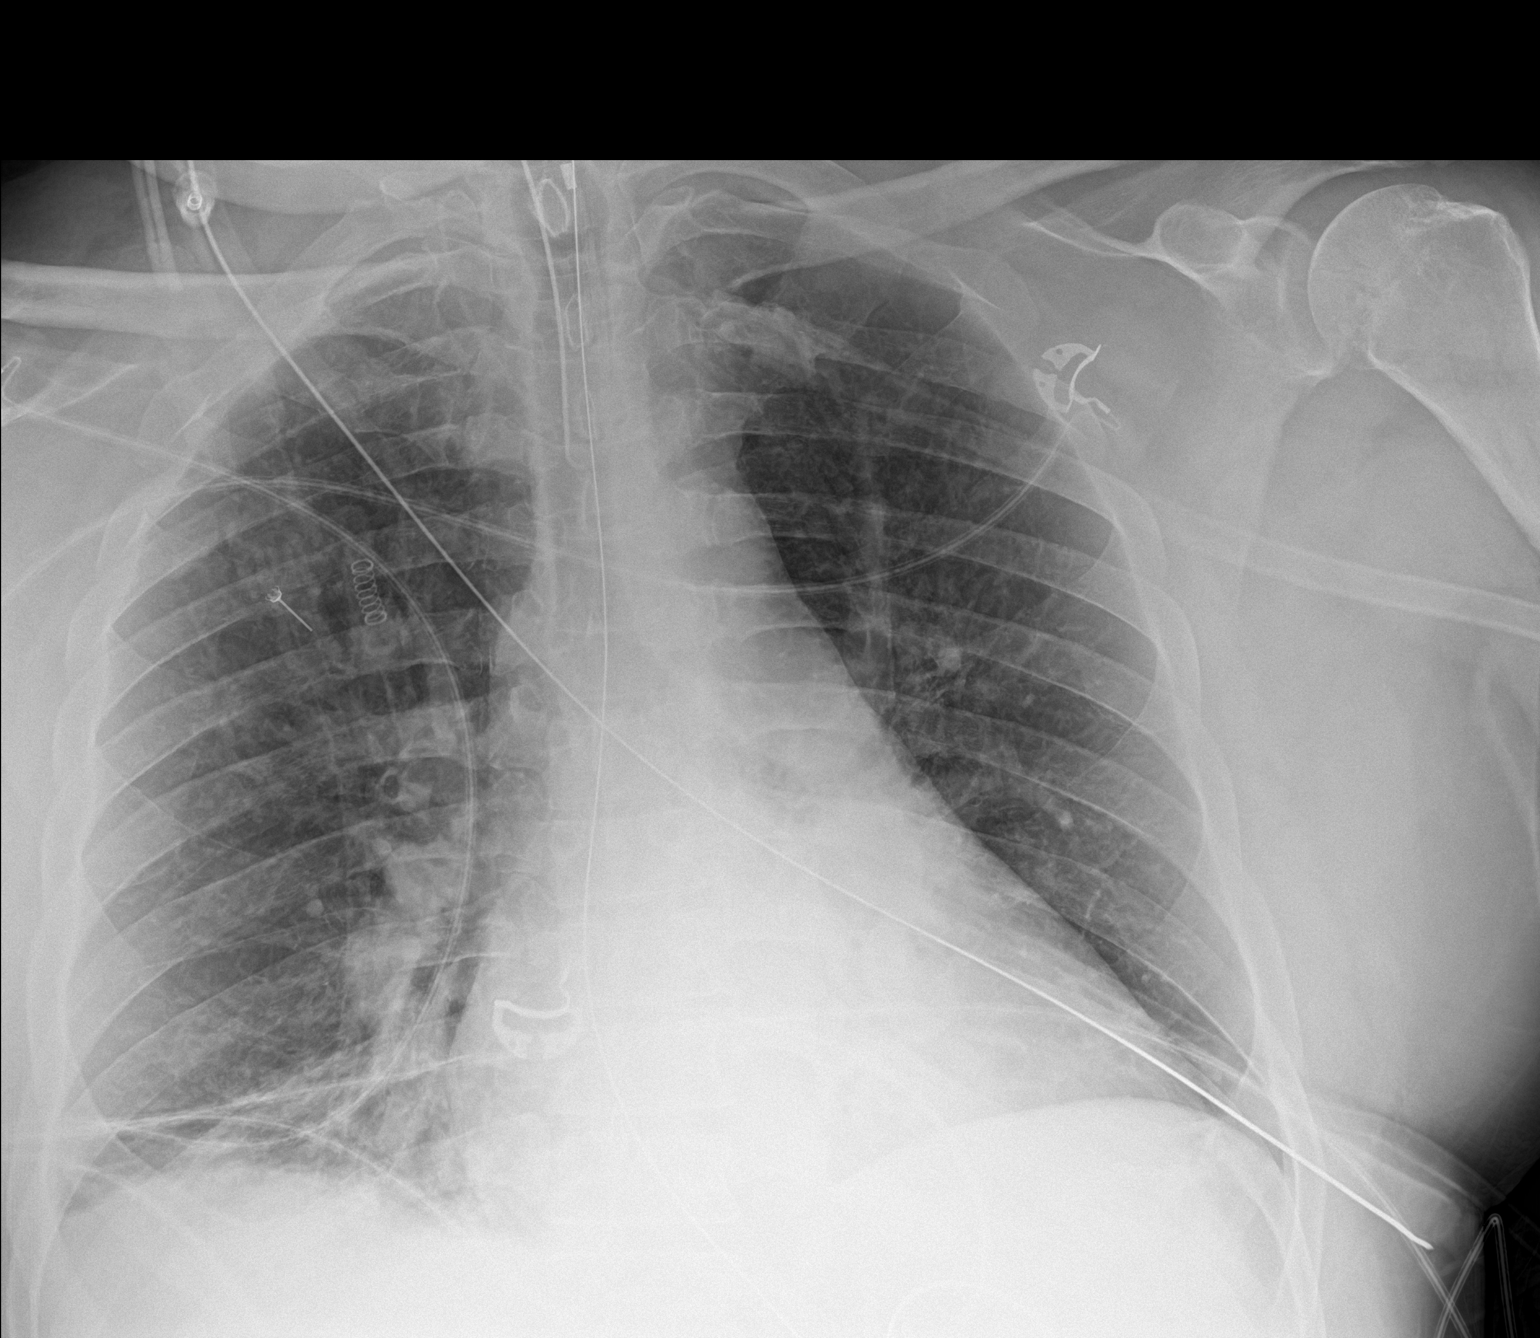

[1 of 1 positions shown; findings below may reference images not displayed]

FINDINGS: Unchanged endotracheal and enteric tubes. Stable cardiomegaly.
Slightly worsened aeration at the right lung base, likely
atelectasis. No focal consolidation, pleural effusion, or
pneumothorax. No acute osseous abnormality.
IMPRESSION: Increased atelectasis at the right lung base.

## 2018-07-18 IMAGING — DX DG CHEST 1V PORT
1 series · 1 of 1 positions shown · non-contrast
Comparison: 10/03/2017

CLINICAL DATA: Pneumonia.  Ventilator support.

EXAM:
PORTABLE CHEST 1 VIEW

[chest ap]
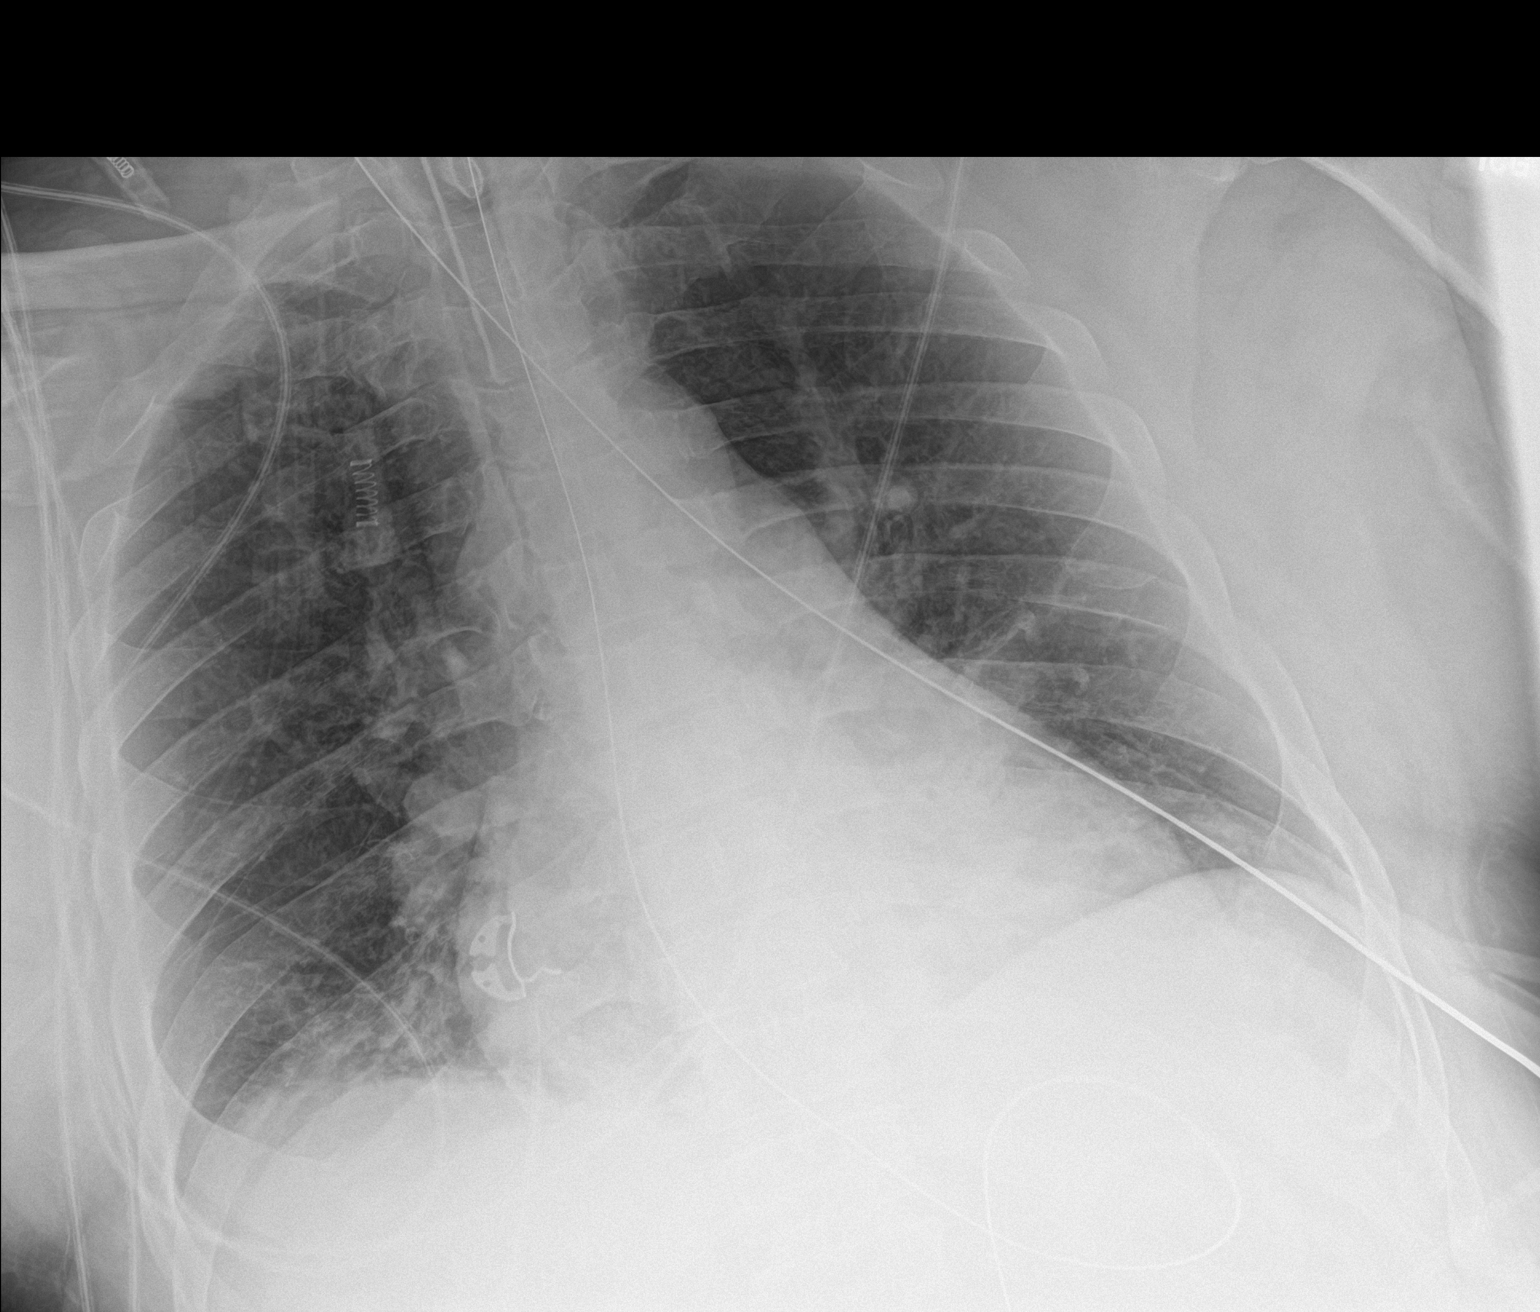

[1 of 1 positions shown; findings below may reference images not displayed]

FINDINGS: Endotracheal tube tip is 5 cm above the carina. Nasogastric tube
enters the stomach. Patchy infiltrate at the lung bases right more
than left persists. No worsening or new finding.
IMPRESSION: Tubes well positioned. Persistent patchy basilar infiltrate right
more than left. No new finding.

## 2018-07-20 ENCOUNTER — Telehealth: Payer: Self-pay | Admitting: Nurse Practitioner

## 2018-07-20 IMAGING — DX DG ABD PORTABLE 1V
1 series · 1 of 1 positions shown · non-contrast
Comparison: None.

CLINICAL DATA: Abdominal distension

EXAM:
PORTABLE ABDOMEN - 1 VIEW

[abdomen kub]
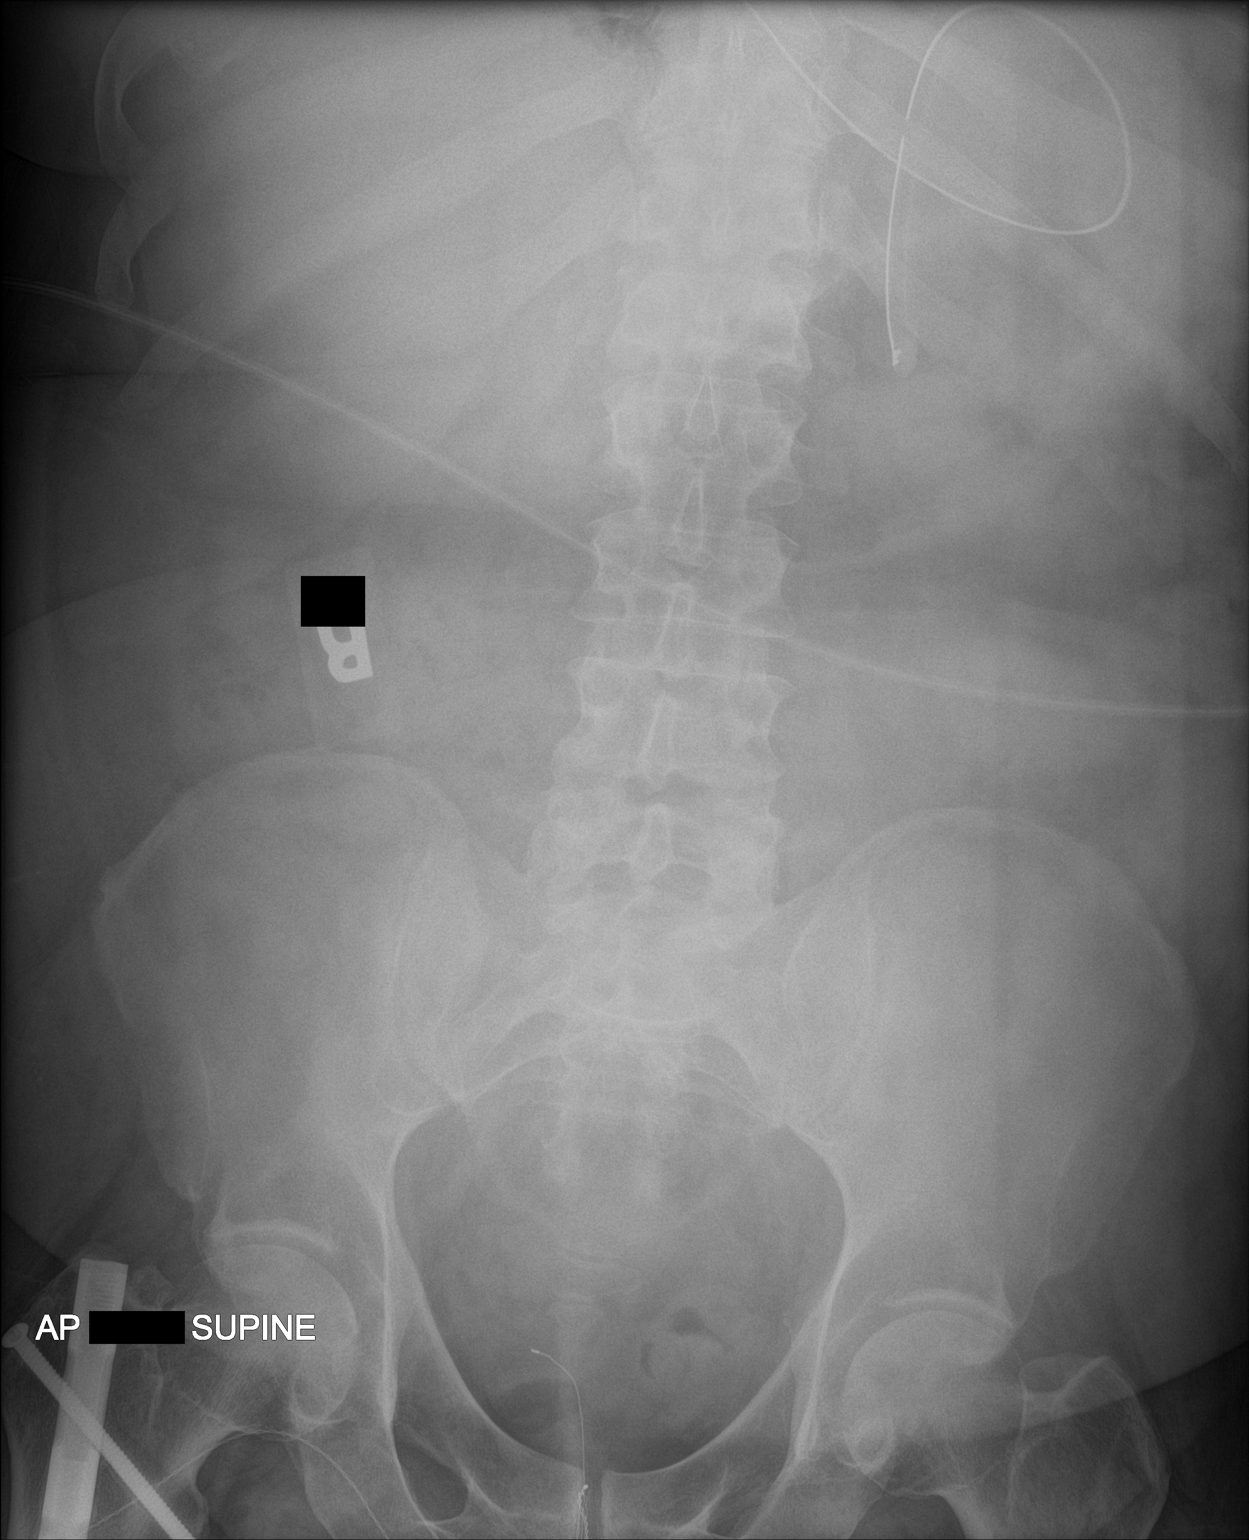

[1 of 1 positions shown; findings below may reference images not displayed]

FINDINGS: Nasogastric tube tip and side port are in the stomach. There is a
relative paucity of gas. There is no bowel dilatation or air-fluid
level to indicate obstruction. No free air. There is postoperative
change in the proximal right femur.
IMPRESSION: Nasogastric tube tip and side port in stomach. There is a paucity of
bowel gas. This finding may be seen normally but also may be
indicative of ileus or enteritis. Bowel obstruction felt to be less
likely. No evident free air.

## 2018-07-20 IMAGING — DX DG CHEST 1V PORT
1 series · 1 of 1 positions shown · non-contrast
Comparison: October 05, 2017

CLINICAL DATA: Hypoxia

EXAM:
PORTABLE CHEST 1 VIEW

[chest ap]
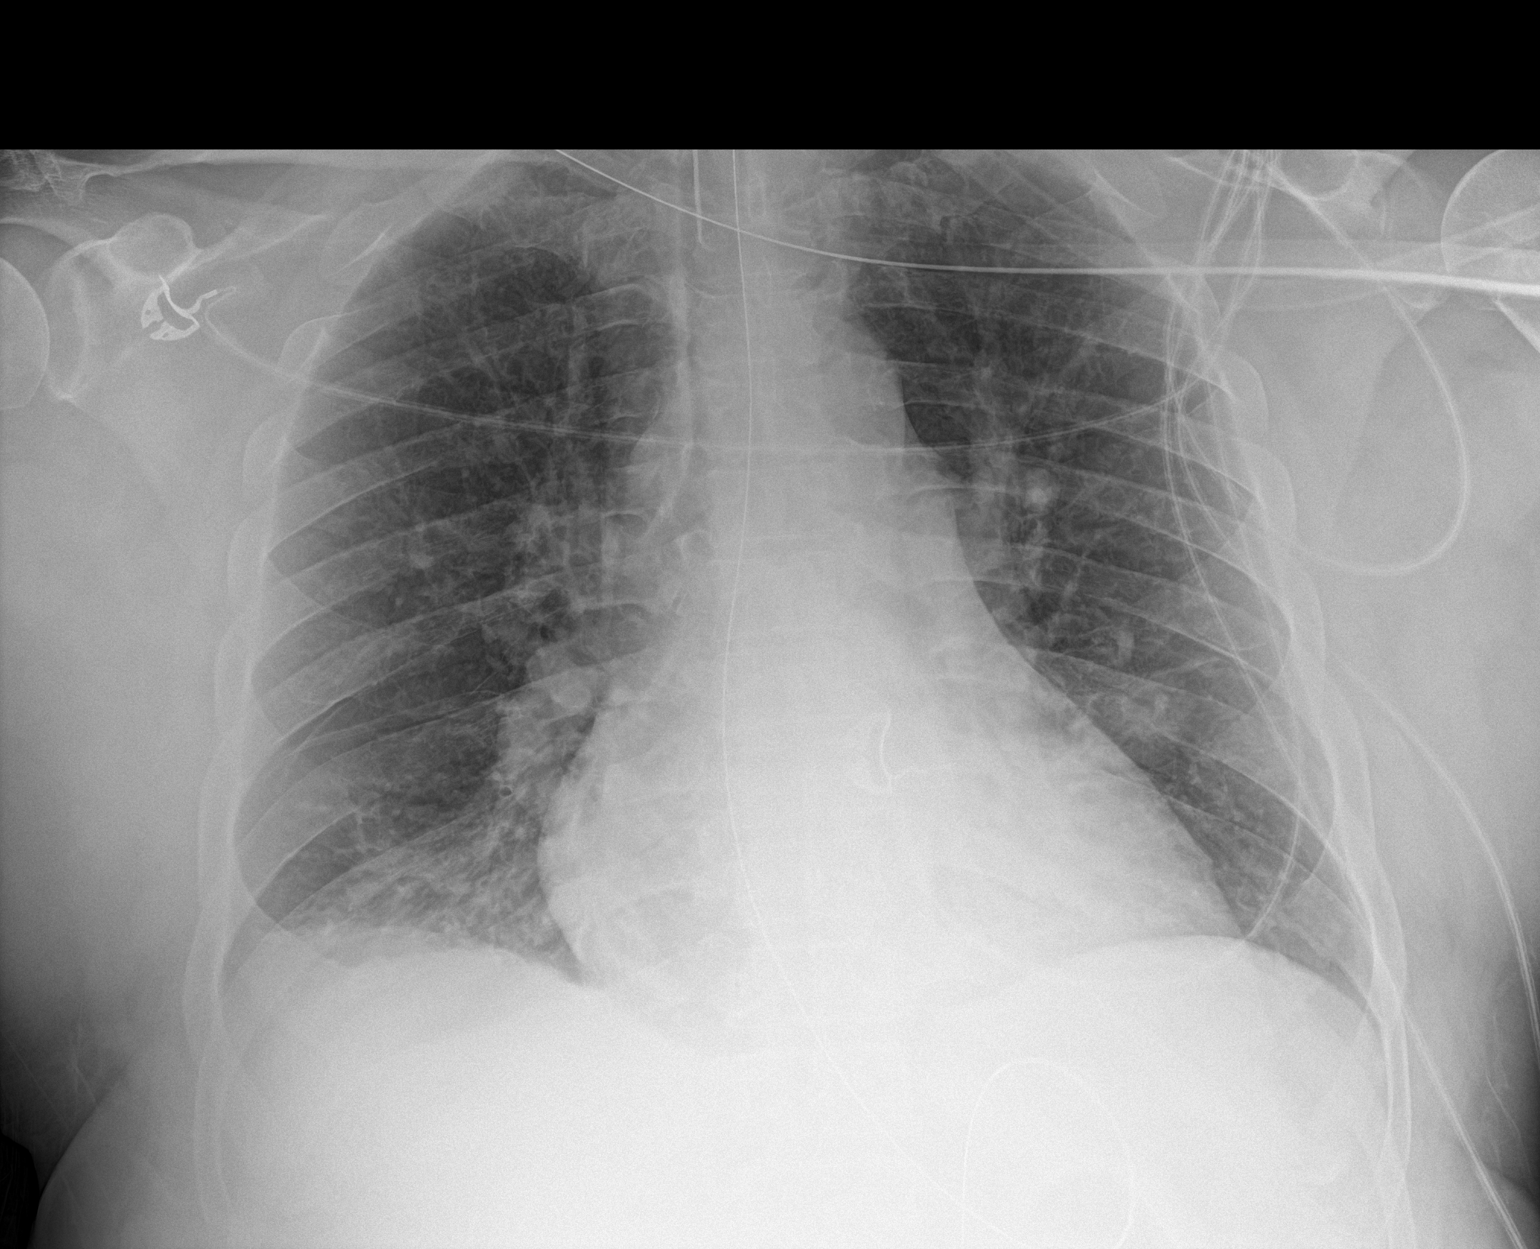

[1 of 1 positions shown; findings below may reference images not displayed]

FINDINGS: Endotracheal tube tip is 6.2 cm above the carina. Nasogastric tube
tip and side port are below the diaphragm. No pneumothorax.

There is a small right pleural effusion. There is bibasilar
atelectasis. There is consolidation in the medial left base. Lungs
elsewhere are clear. Heart is borderline enlarged with pulmonary
vascularity within normal limits. No adenopathy. There is
degenerative change in each shoulder.
IMPRESSION: Tube positions as described without pneumothorax. Small right
pleural effusion with bibasilar atelectasis. There is a degree of
consolidation medial left base suspicious for pneumonia. No new
opacity evident. Stable cardiac silhouette.

## 2018-07-20 NOTE — Telephone Encounter (Signed)
Patient called because he says that his insurance won't cover the gabapentin at the dosage that was prescribed. He states that he has not been able to take his medication. Please follow up.   Patient would like medication sent to [facility] on [address].

## 2018-07-21 ENCOUNTER — Other Ambulatory Visit: Payer: Self-pay | Admitting: Nurse Practitioner
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
## 2018-07-21 IMAGING — DX DG CHEST 1V PORT
1 series · 1 of 1 positions shown · non-contrast
Comparison: Study obtained earlier in the day

CLINICAL DATA: Hypoxia

EXAM:
PORTABLE CHEST 1 VIEW

[chest ap]
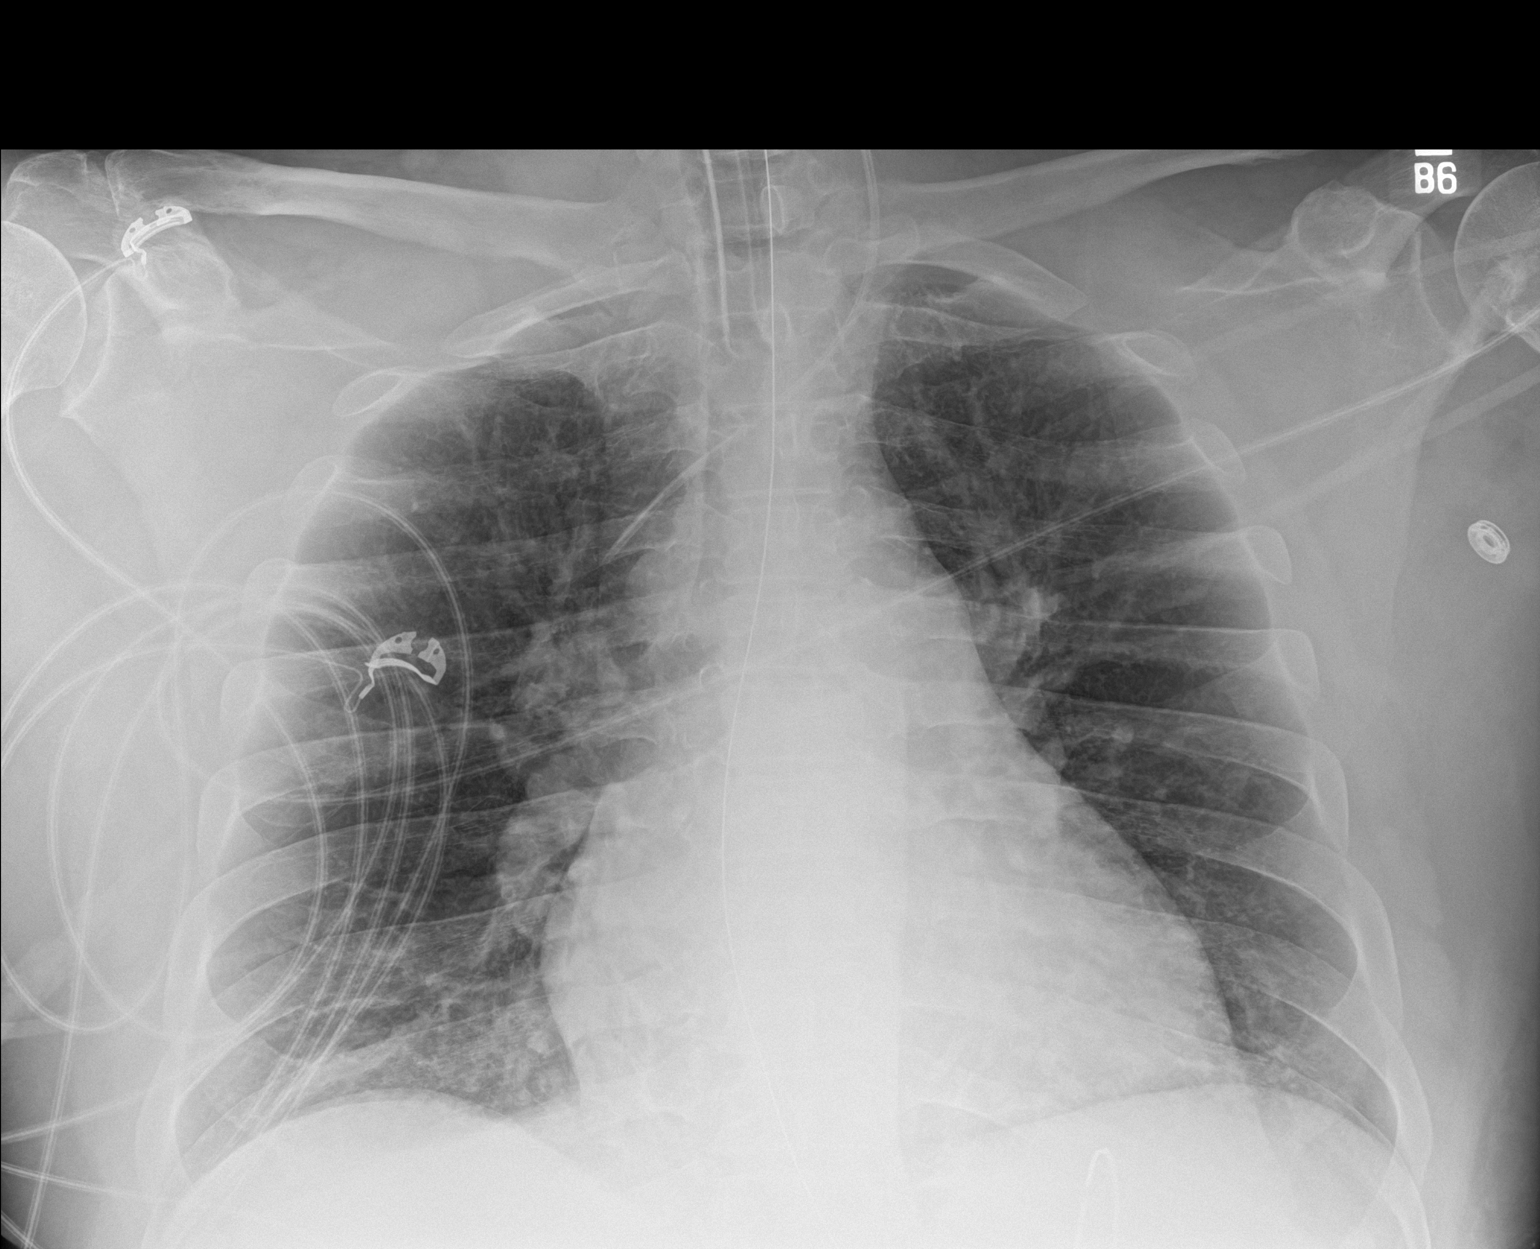

[1 of 1 positions shown; findings below may reference images not displayed]

FINDINGS: Endotracheal tube tip is 7.2 cm above the carina. There is no edema
jugular catheter with the tip in the superior vena cava just beyond
the junction with the left innominate vein. Nasogastric tube tip and
side port are below the diaphragm. No pneumothorax.

There is mild bibasilar atelectasis. Lungs elsewhere clear. Heart is
mildly enlarged with pulmonary venous hypertension. No adenopathy.
No bone lesions.
IMPRESSION: Tube and catheter positions as described without pneumothorax.
Pulmonary vascular congestion without edema or consolidation. There
is bibasilar atelectasis.

## 2018-07-21 MED ORDER — DULOXETINE HCL 30 MG PO CPEP: 30.0000 mg | ORAL_CAPSULE | Freq: Every day | ORAL | 1 refills | Status: DC

## 2018-07-21 NOTE — Telephone Encounter (Signed)
Cymbalta has been sent and gabapentin has been discontinued.

## 2018-07-22 ENCOUNTER — Encounter: Payer: Self-pay | Admitting: Cardiology

## 2018-07-22 ENCOUNTER — Ambulatory Visit (INDEPENDENT_AMBULATORY_CARE_PROVIDER_SITE_OTHER): Payer: Medicare HMO | Admitting: Cardiology

## 2018-07-22 VITALS — BP 128/71 | HR 69 | Ht 69.0 in | Wt 263.2 lb

## 2018-07-22 DIAGNOSIS — F17210 Nicotine dependence, cigarettes, uncomplicated: Secondary | ICD-10-CM

## 2018-07-22 DIAGNOSIS — E7849 Other hyperlipidemia: Secondary | ICD-10-CM | POA: Insufficient documentation

## 2018-07-22 DIAGNOSIS — I5032 Chronic diastolic (congestive) heart failure: Secondary | ICD-10-CM | POA: Diagnosis not present

## 2018-07-22 DIAGNOSIS — R69 Illness, unspecified: Secondary | ICD-10-CM | POA: Diagnosis not present

## 2018-07-22 DIAGNOSIS — I10 Essential (primary) hypertension: Secondary | ICD-10-CM

## 2018-07-22 DIAGNOSIS — I480 Paroxysmal atrial fibrillation: Secondary | ICD-10-CM

## 2018-07-22 IMAGING — DX DG CHEST 1V PORT
1 series · 1 of 1 positions shown · non-contrast
Comparison: 10/07/2017.

CLINICAL DATA: Intubation.

EXAM:
PORTABLE CHEST 1 VIEW

[chest ap]
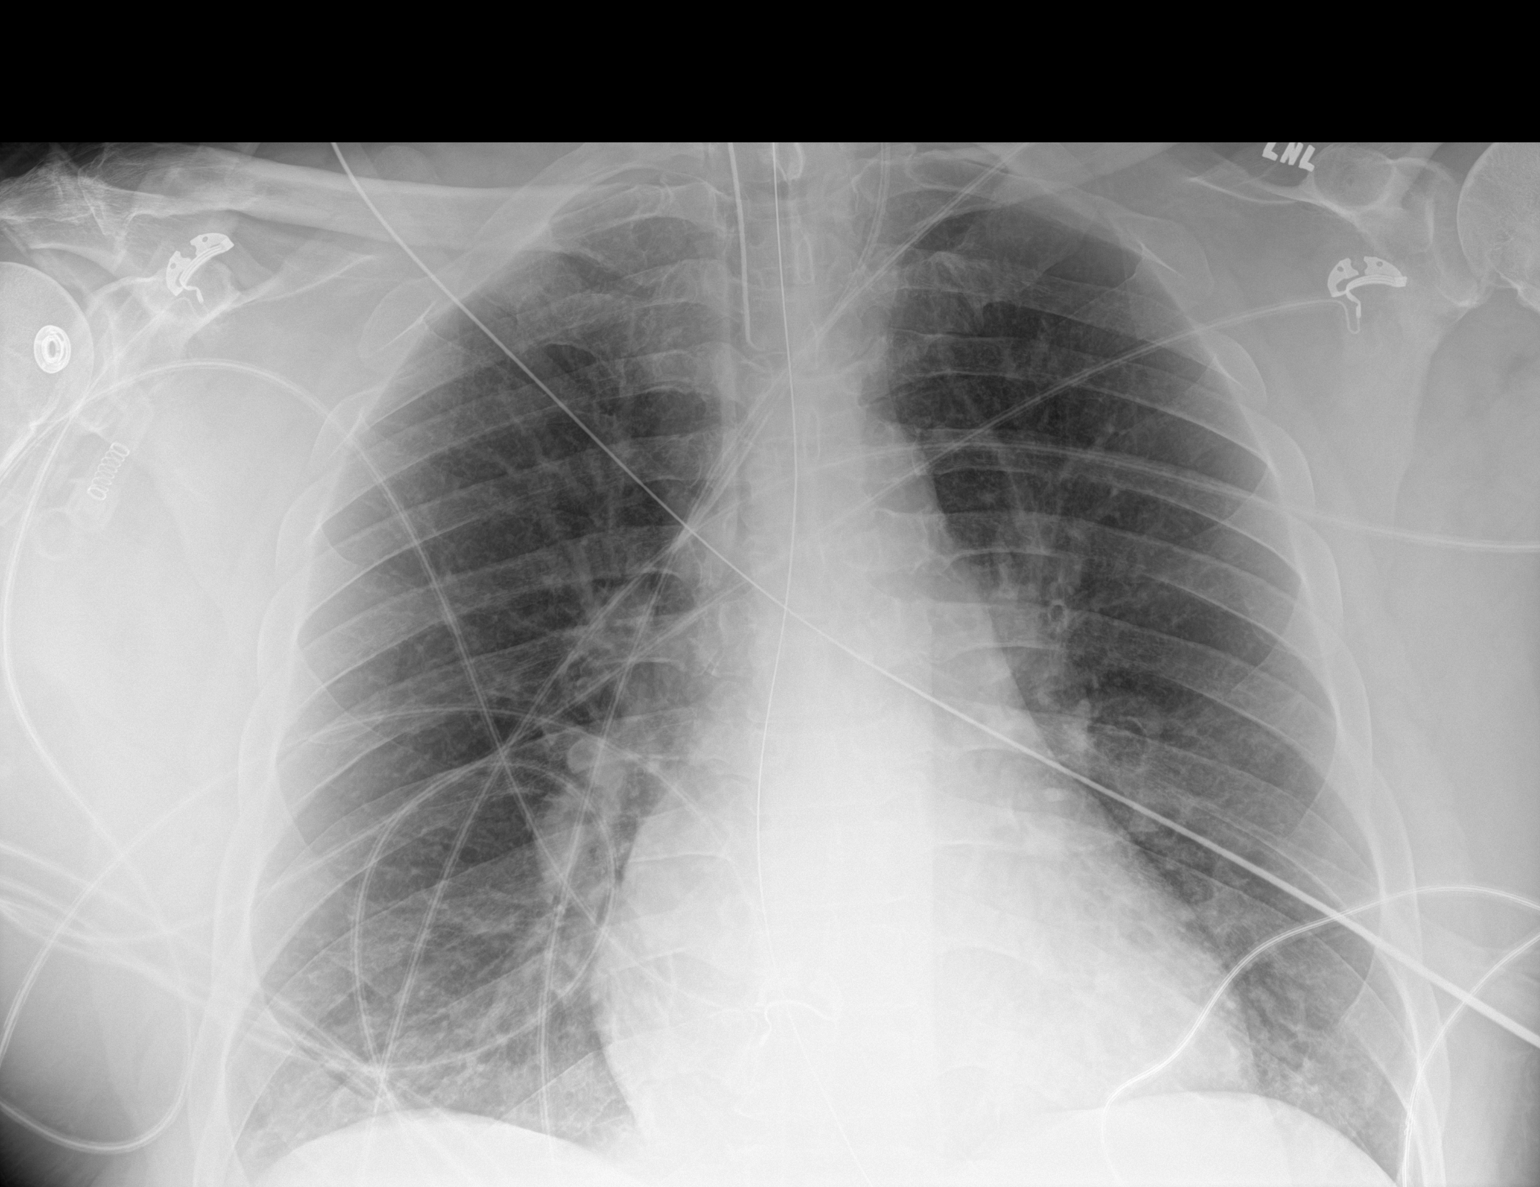

[1 of 1 positions shown; findings below may reference images not displayed]

FINDINGS: Endotracheal tube and NG tube in stable position. Stable
cardiomegaly. Interim improvement of pulmonary venous congestion.
Persistent basilar atelectasis. No pleural effusion or pneumothorax.
IMPRESSION: 1.  Lines and tubes in stable position.

2. Stable cardiomegaly. Interim improvement of pulmonary venous
congestion.

3.  Persistent bibasilar atelectasis.

## 2018-07-22 MED ORDER — DILTIAZEM HCL ER COATED BEADS 240 MG PO CP24: 240.0000 mg | ORAL_CAPSULE | Freq: Every day | ORAL | 3 refills | Status: DC

## 2018-07-22 NOTE — Assessment & Plan Note (Signed)
Most recent lipid panel shows poorly controlled lipids.  This is not been focused on that I can tell in quite some time.  Will defer to PCP for management at this time, but may want to consider initiating statin.  Will need to consult with pharmacy to ensure that nothing interacts with his HIV meds.

## 2018-07-22 NOTE — Assessment & Plan Note (Addendum)
Initial diagnosis of A. fib was in the setting of a COPD exacerbation.  I think this may have been the main driving force.  As far as he can tell he has not had any further breakthrough episodes.  No longer on amiodarone, but also not on diltiazem which is somewhat concerning given the PACs on exam. -->  Will restart diltiazem XT 240 mg daily. He remains on Eliquis without major bleeding.
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
## 2018-07-22 NOTE — Assessment & Plan Note (Signed)
Blood pressure looks okay with hydralazine.  However will restart diltiazem for rate control. I think this somehow was inadvertently dropped off of his list.

## 2018-07-22 NOTE — Assessment & Plan Note (Signed)
Hard to believe that he is still smoking with such significant COPD.  I chose to avoid a prolonged discussion because he did not seem to be interested.

## 2018-07-22 NOTE — Patient Instructions (Addendum)
Medication Instructions:   RESTART DILTIAZEM XR 240 MG ONE CAPSULE DAILY   If you need a refill on your cardiac medications before your next appointment, please call your pharmacy.   Lab work: NOT NEEDED If you have labs (blood work) drawn today and your tests are completely normal, you will receive your results only by: [name]. MyChart Message (if you have MyChart) OR . A paper copy in the mail If you have any lab test that is abnormal or we need to change your treatment, we will call you to review the results.  Testing/Procedures: NOT NEEDED  Follow-Up: At [facility], you and your health needs are our priority.  As part of our continuing mission to provide you with exceptional heart care, we have created designated Provider Care Teams.  These Care Teams include your primary Cardiologist (physician) and Advanced Practice Providers (APPs -  Physician Assistants and Nurse Practitioners) who all work together to provide you with the care you need, when you need it. You will need a follow up appointment in 12 months DEC 2020.  Please call our office 2 months in advance to schedule this appointment.  You may see DR Herbie BaltimoreHARDING or one of the following Advanced Practice Providers on your designated Care Team:   Theodore DemarkRhonda Barrett, PA-C . Joni ReiningKathryn Lawrence, DNP, -  Your physician recommends that you schedule a follow-up appointment in 6 MONTHS - June 2020 .   Any Other Special Instructions Will Be Listed Below (If Applicable).  CONTACT YOUR PRIMARY DOCTOR ABOUT REFILL GABAPENTIN

## 2018-07-22 NOTE — Assessment & Plan Note (Signed)
Probably has some hypertensive heart disease but has preserved LVEF.  I suspect in the setting of CAD severe COPD, he simply does not tolerate being in A. fib.  Seems euvolemic on exam.  Not on diuretic at this point and stable. We will restart diltiazem for rate control since he is having PACs now. Continue hydralazine for afterload reduction.

## 2018-07-23 IMAGING — DX DG CHEST 1V PORT
1 series · 1 of 1 positions shown · non-contrast
Comparison: Study obtained earlier in the day

CLINICAL DATA: Tracheostomy placement

EXAM:
PORTABLE CHEST 1 VIEW

[chest]
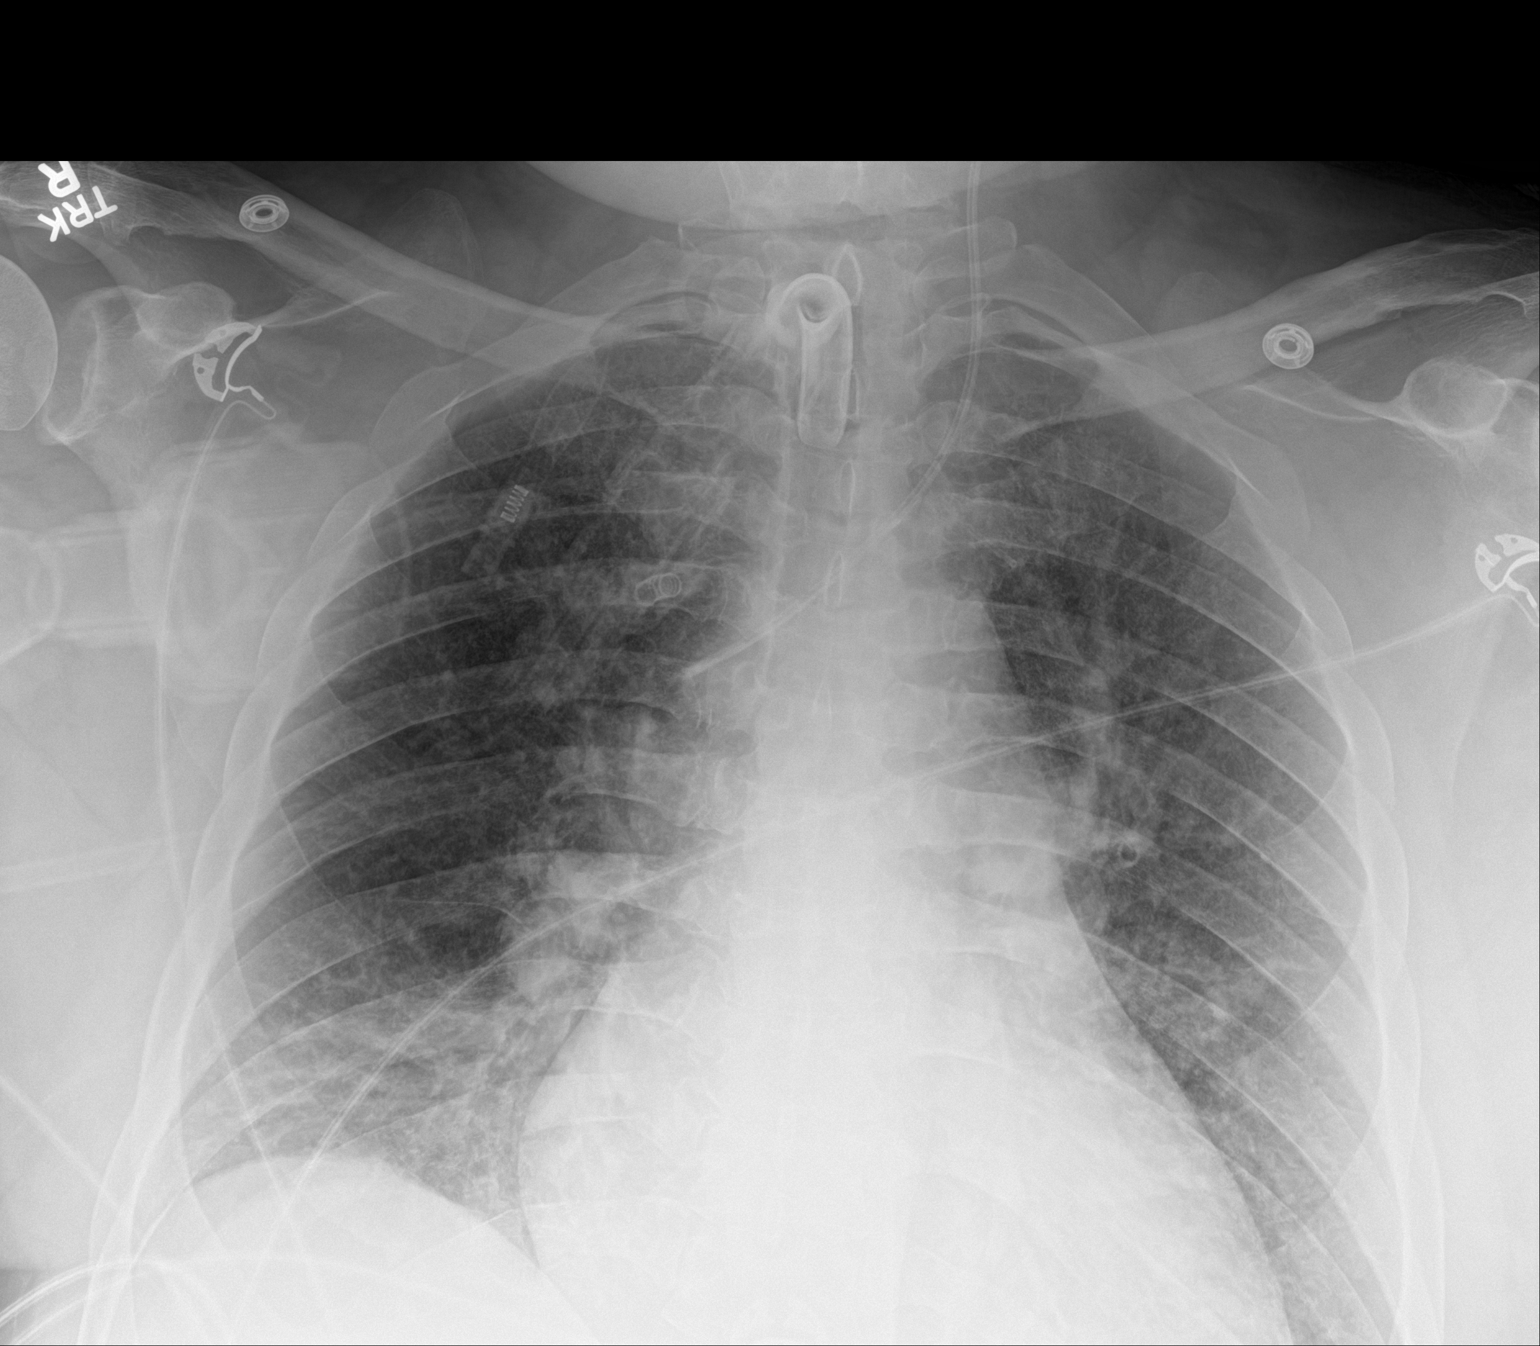

[1 of 1 positions shown; findings below may reference images not displayed]

FINDINGS: There is now a tracheostomy present with the tracheostomy tip 7.8 cm
above the carina. Left jugular catheter tip is in the left
innominate vein near the junction with the superior vena cava. No
pneumothorax.

A portion of the left base is not seen. There is atelectatic change
in the right base. Visualized lungs elsewhere clear.

There is cardiomegaly with mild pulmonary venous hypertension.
IMPRESSION: 1. Tube and catheter positions as described without pneumothorax
evident.

2. A small portion of the left base is not visualized. There is
atelectatic change in the right base. Visualized lungs elsewhere
clear.

3.  There is a degree of pulmonary vascular congestion.

## 2018-07-23 IMAGING — DX DG CHEST 1V PORT
1 series · 1 of 1 positions shown · non-contrast
Comparison: 10/08/2017.

CLINICAL DATA: Intubation.

EXAM:
PORTABLE CHEST 1 VIEW

[chest ap]
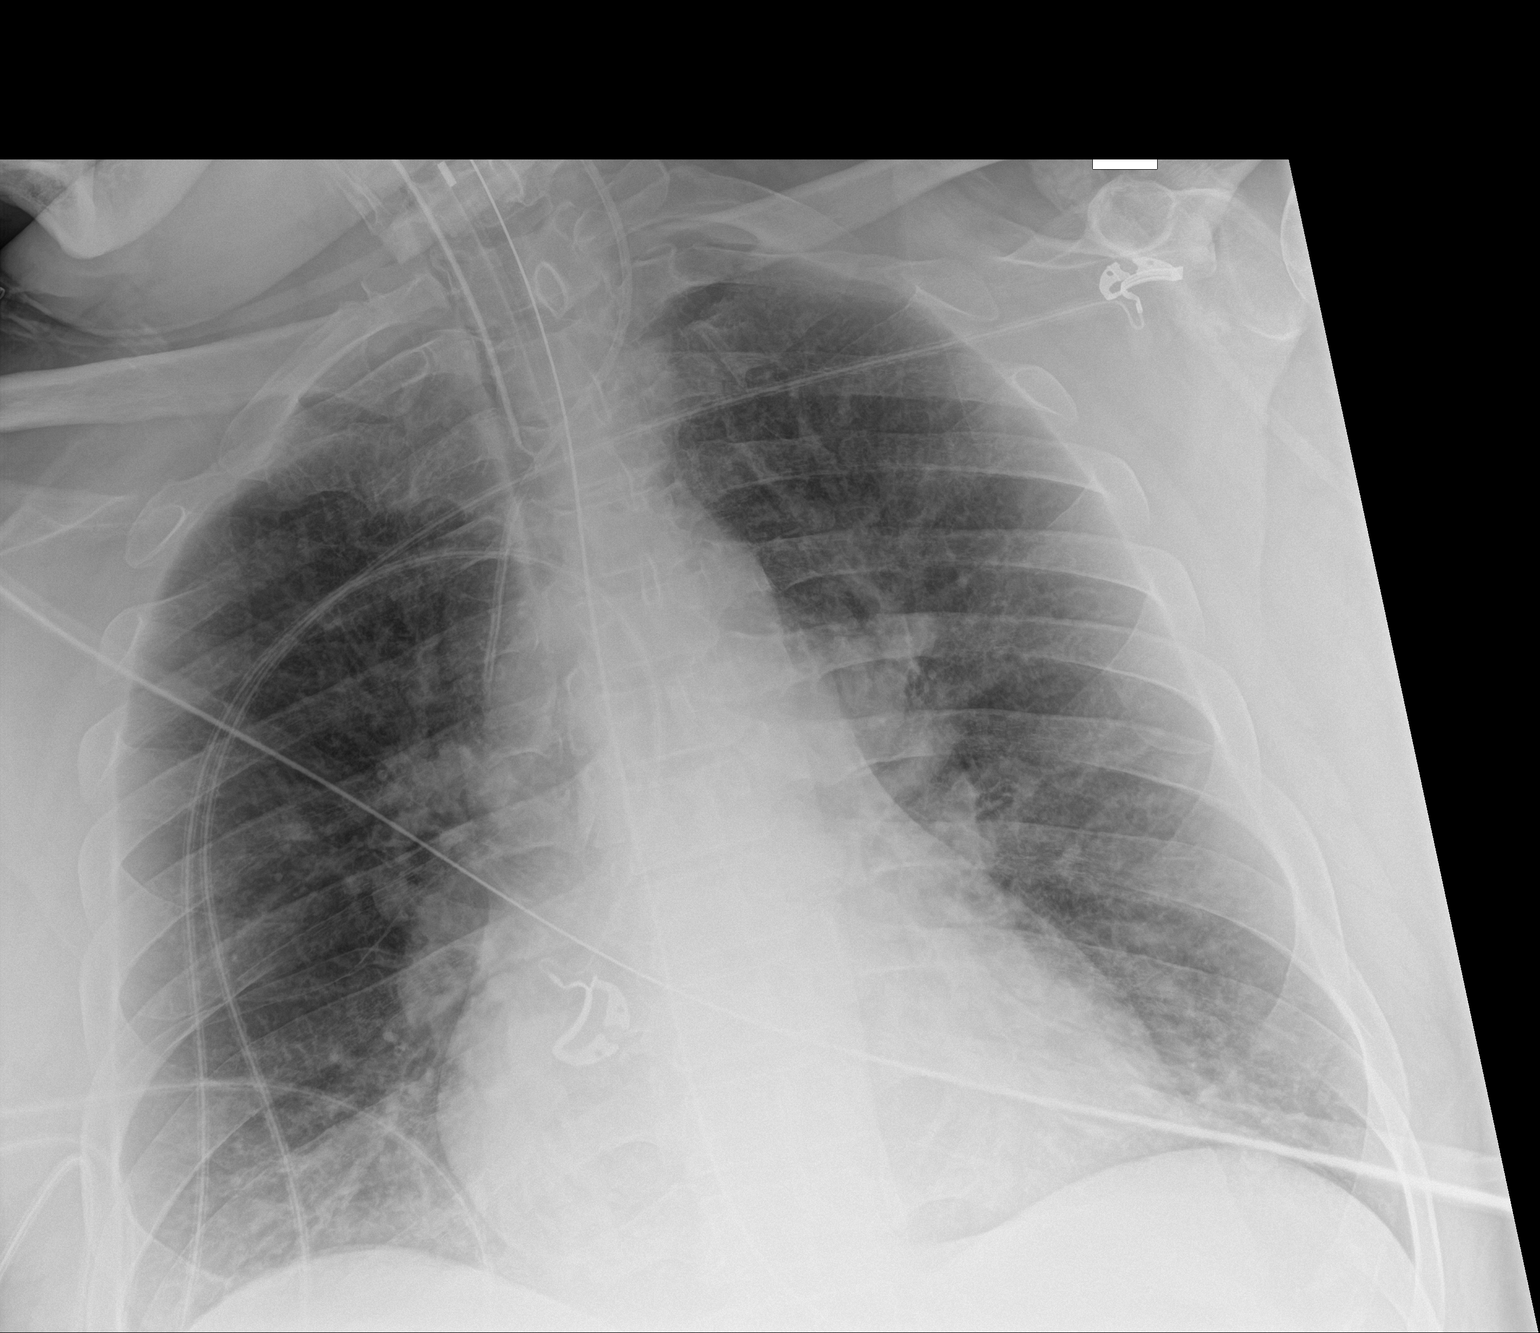

[1 of 1 positions shown; findings below may reference images not displayed]

FINDINGS: Endotracheal tube, NG tube, left IJ line stable position.
Cardiomegaly with pulmonary vascular congestion. Mild bilateral
interstitial prominence. Findings suggest mild CHF. No prominent
pleural effusion or pneumothorax. Right costophrenic angle not
imaged.
IMPRESSION: 1.  Lines and tubes in stable position.

2. Cardiomegaly with pulmonary venous congestion and mild bilateral
interstitial prominence noted on today's exam. Findings suggest mild
CHF.

## 2018-07-25 IMAGING — DX DG CHEST 1V PORT
1 series · 1 of 1 positions shown · non-contrast
Comparison: 10/09/2017

CLINICAL DATA: Dialysis catheter insertion

EXAM:
PORTABLE CHEST 1 VIEW

[chest]
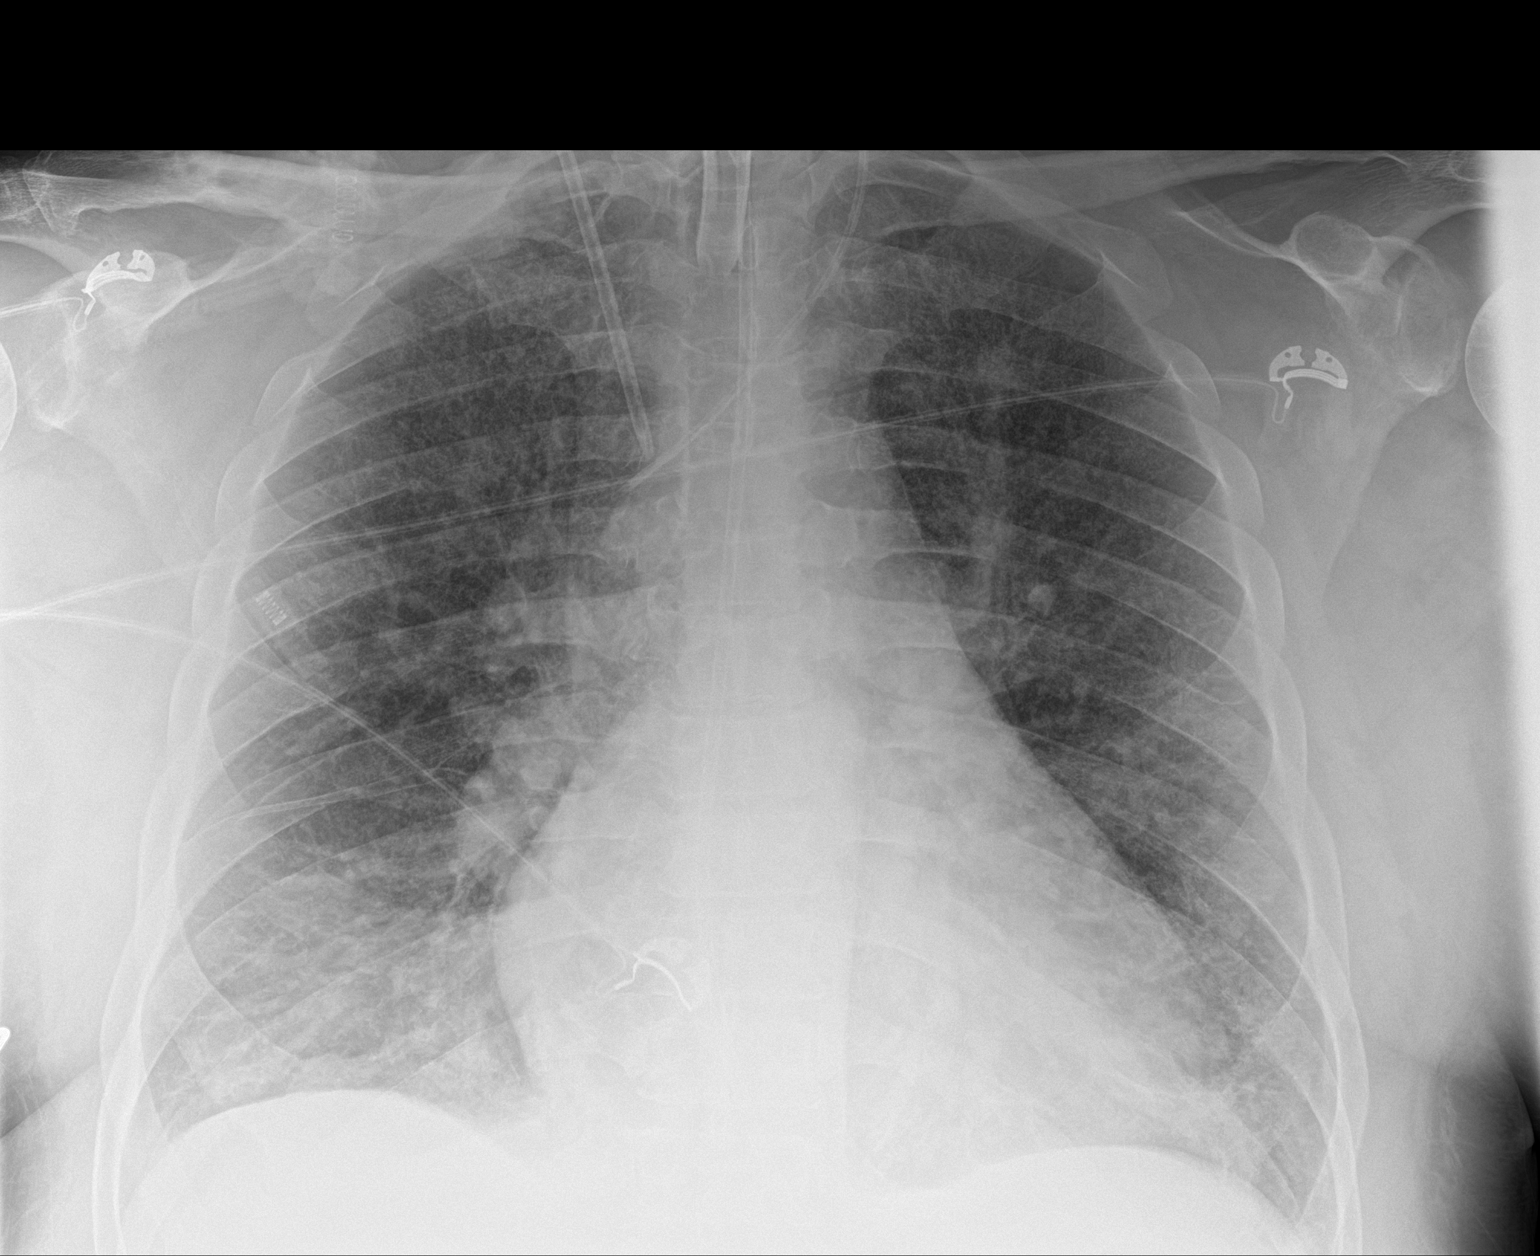

[1 of 1 positions shown; findings below may reference images not displayed]

FINDINGS: Tracheostomy in satisfactory position.

Left IJ venous catheter terminates in the mid SVC.

Right IJ dialysis catheter terminates in the mid SVC.

Enteric tube courses below the diaphragm.

Cardiomegaly. Pulmonary vascular congestion, without frank
interstitial edema. Mild bibasilar atelectasis. No pleural effusion
or pneumothorax.
IMPRESSION: Right IJ dialysis catheter terminates in the mid SVC.

Additional support apparatus as above.

Cardiomegaly with pulmonary vascular congestion and mild bibasilar
atelectasis.

## 2018-07-26 IMAGING — DX DG CHEST 1V PORT
1 series · 1 of 1 positions shown · non-contrast
Comparison: 10/11/2017

CLINICAL DATA: Pneumonia.

EXAM:
PORTABLE CHEST 1 VIEW

[chest ap]
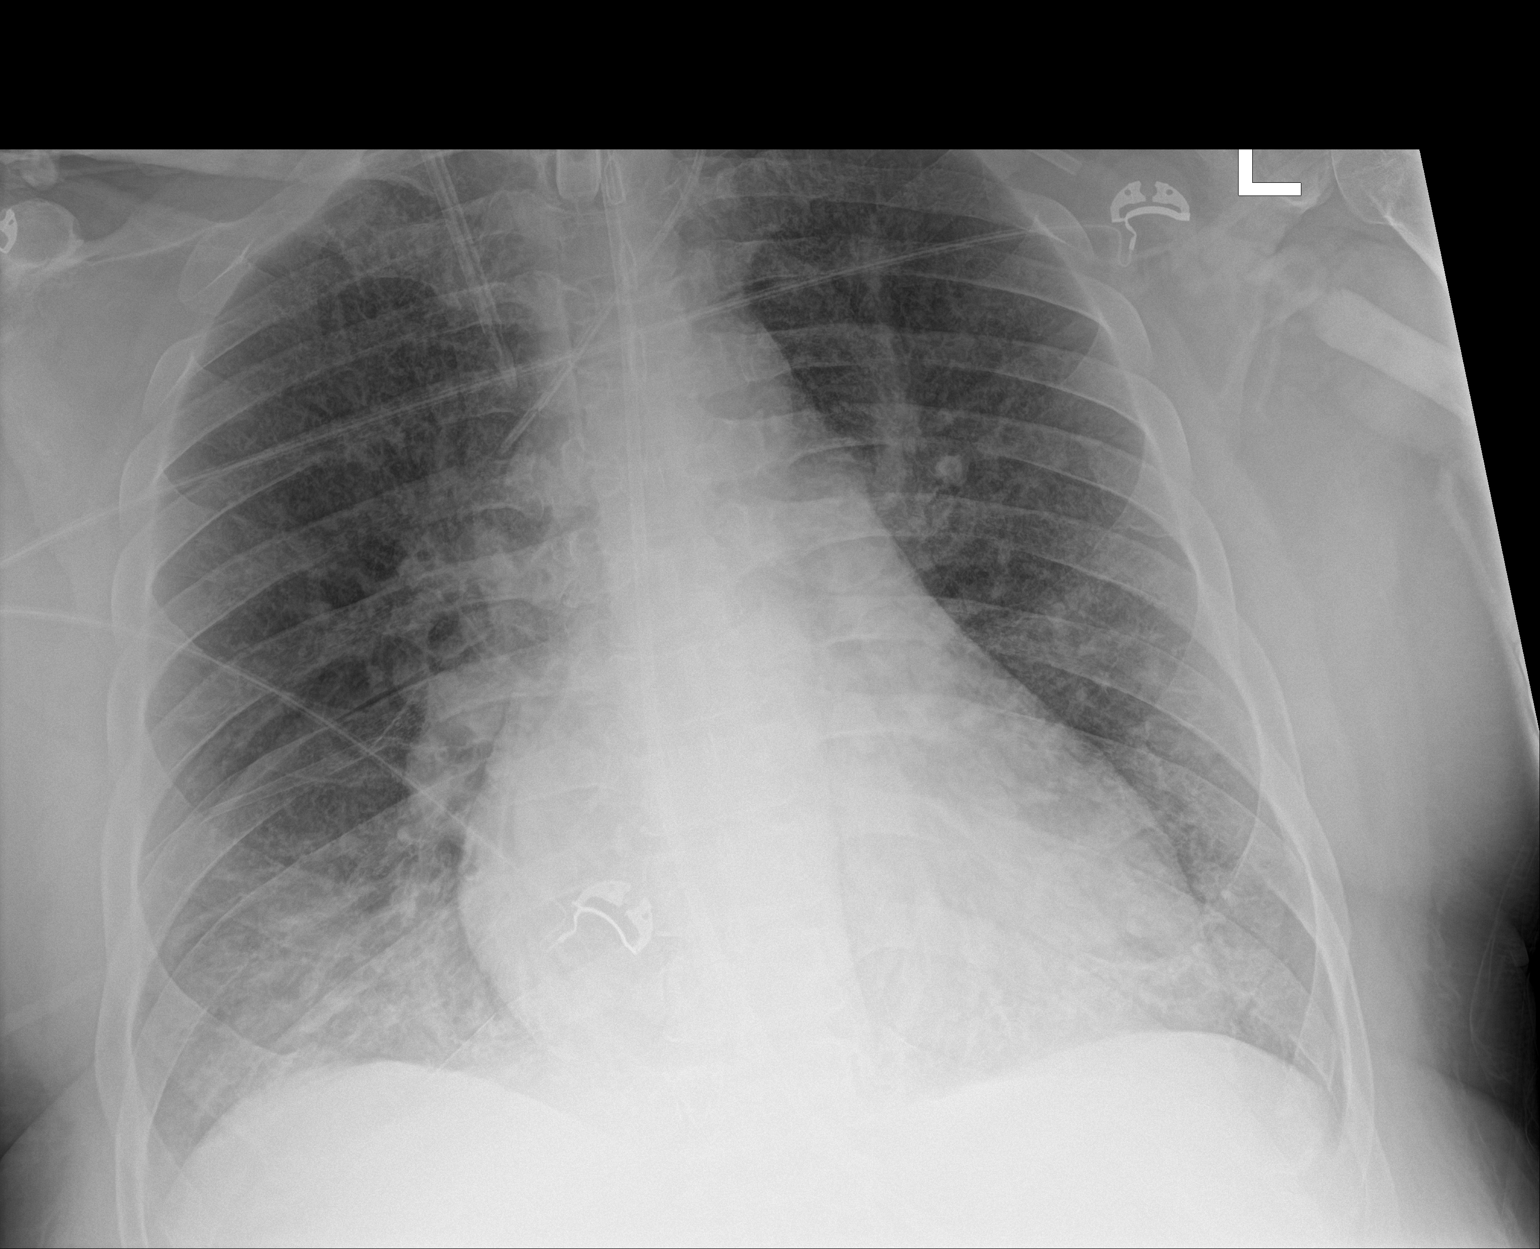

[1 of 1 positions shown; findings below may reference images not displayed]

FINDINGS: Tracheostomy tube overlies the airway. Bilateral jugular catheters
terminate over the upper SVC, unchanged. Enteric tube courses into
the upper abdomen with tip not imaged. The cardiac silhouette
remains enlarged. Pulmonary vascular congestion is unchanged. Patchy
bibasilar opacities are unchanged. No sizable pleural effusion or
pneumothorax is identified.
IMPRESSION: Unchanged appearance of the chest. Cardiomegaly, pulmonary vascular
congestion, and mild bibasilar atelectasis.

## 2018-07-27 IMAGING — DX DG CHEST 1V PORT
2 series · 2 of 2 positions shown · non-contrast
Comparison: Radiograph yesterday at 2032 hour

CLINICAL DATA: Oxygen desaturation.

EXAM:
PORTABLE CHEST 1 VIEW

[chest ap (1 of 2)]
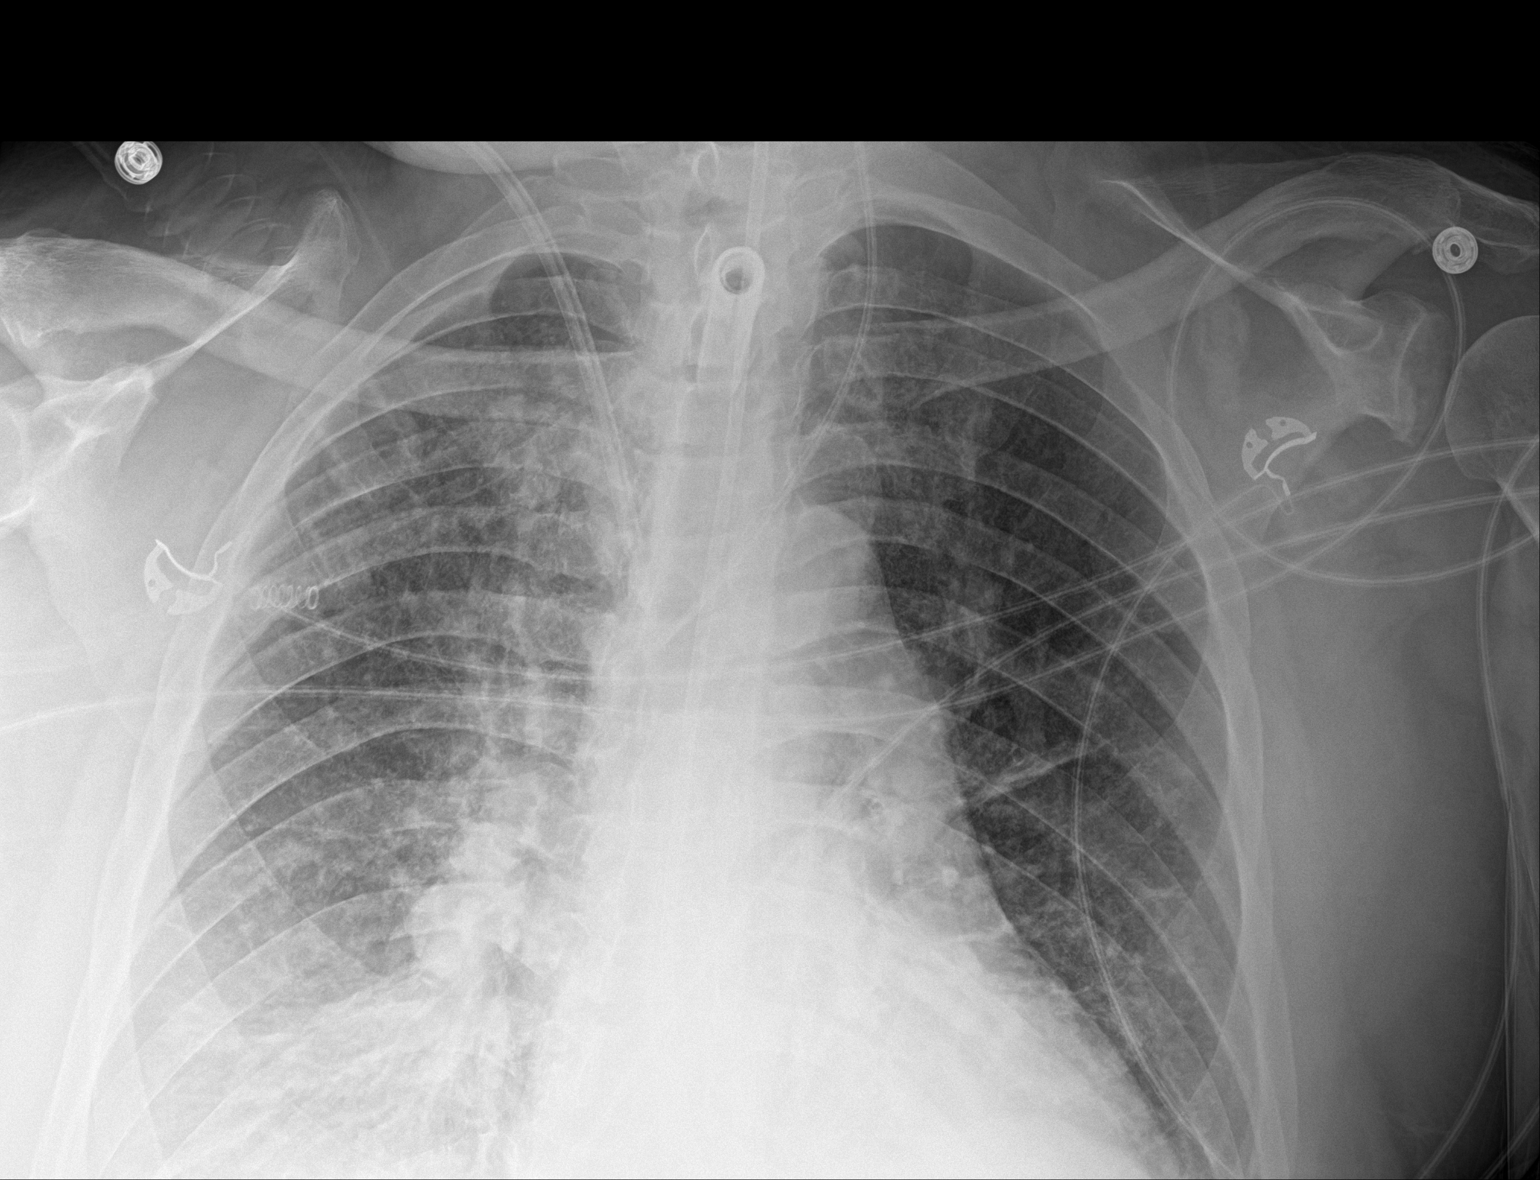

[chest ap (2 of 2)]
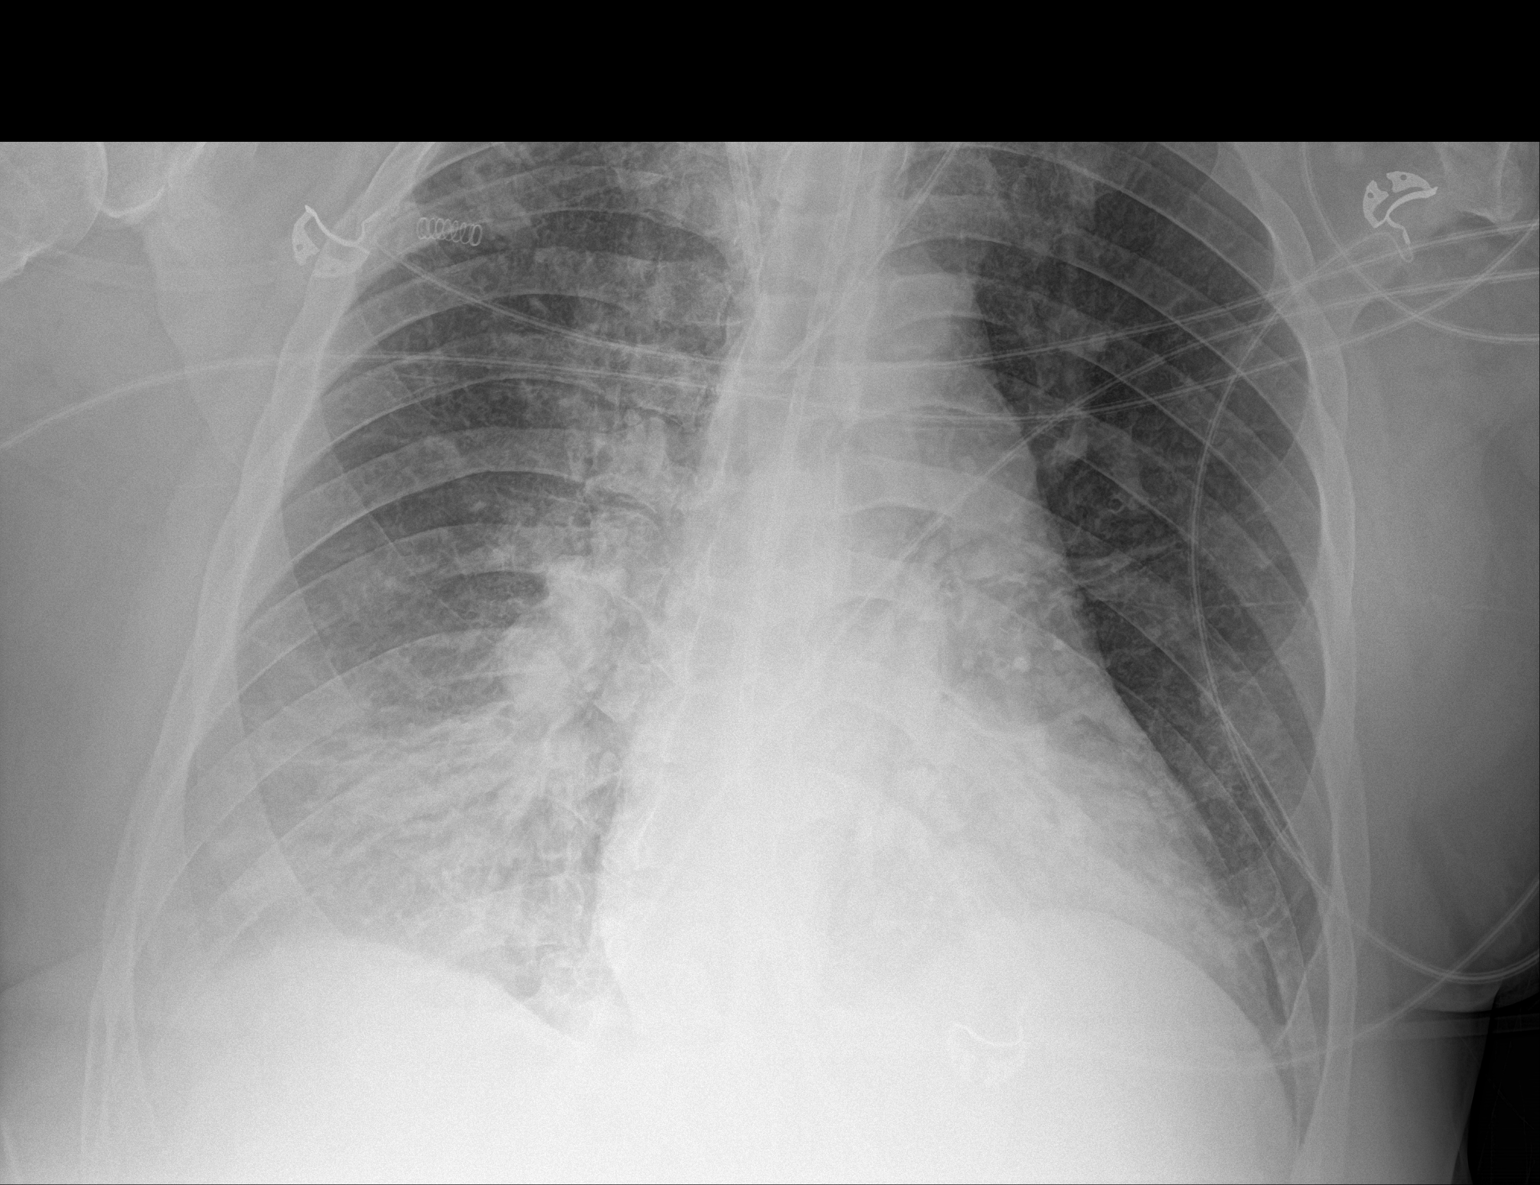

[2 of 2 positions shown; findings below may reference images not displayed]

FINDINGS: Tracheostomy tube at the thoracic inlet. Enteric tube in place, tip
below the diaphragm not included in the field of view. Right
internal jugular sheath with tip projecting over the proximal SVC,
unchanged. Left internal jugular central venous catheter with tip
projecting over the proximal SVC, unchanged.

Worsening right lung base aeration with increasing patchy and hazy
opacity. Cardiomegaly and vascular congestion are unchanged. Left
lung base atelectasis is unchanged. No pneumothorax.
IMPRESSION: 1. Worsening patchy and hazy opacity at the right lung base may
represent developing pleural effusion and atelectasis or airspace
disease.
2. Cardiomegaly and vascular congestion are again seen.
3. Support apparatus are unchanged.

## 2018-07-28 ENCOUNTER — Encounter: Payer: Self-pay | Admitting: Podiatry

## 2018-07-28 IMAGING — DX DG CHEST 1V PORT
1 series · 1 of 1 positions shown · non-contrast
Comparison: 10/13/2017 and CT chest 07/23/2017.

CLINICAL DATA: Respiratory failure, endotracheal tube.

EXAM:
PORTABLE CHEST 1 VIEW

[chest ap]
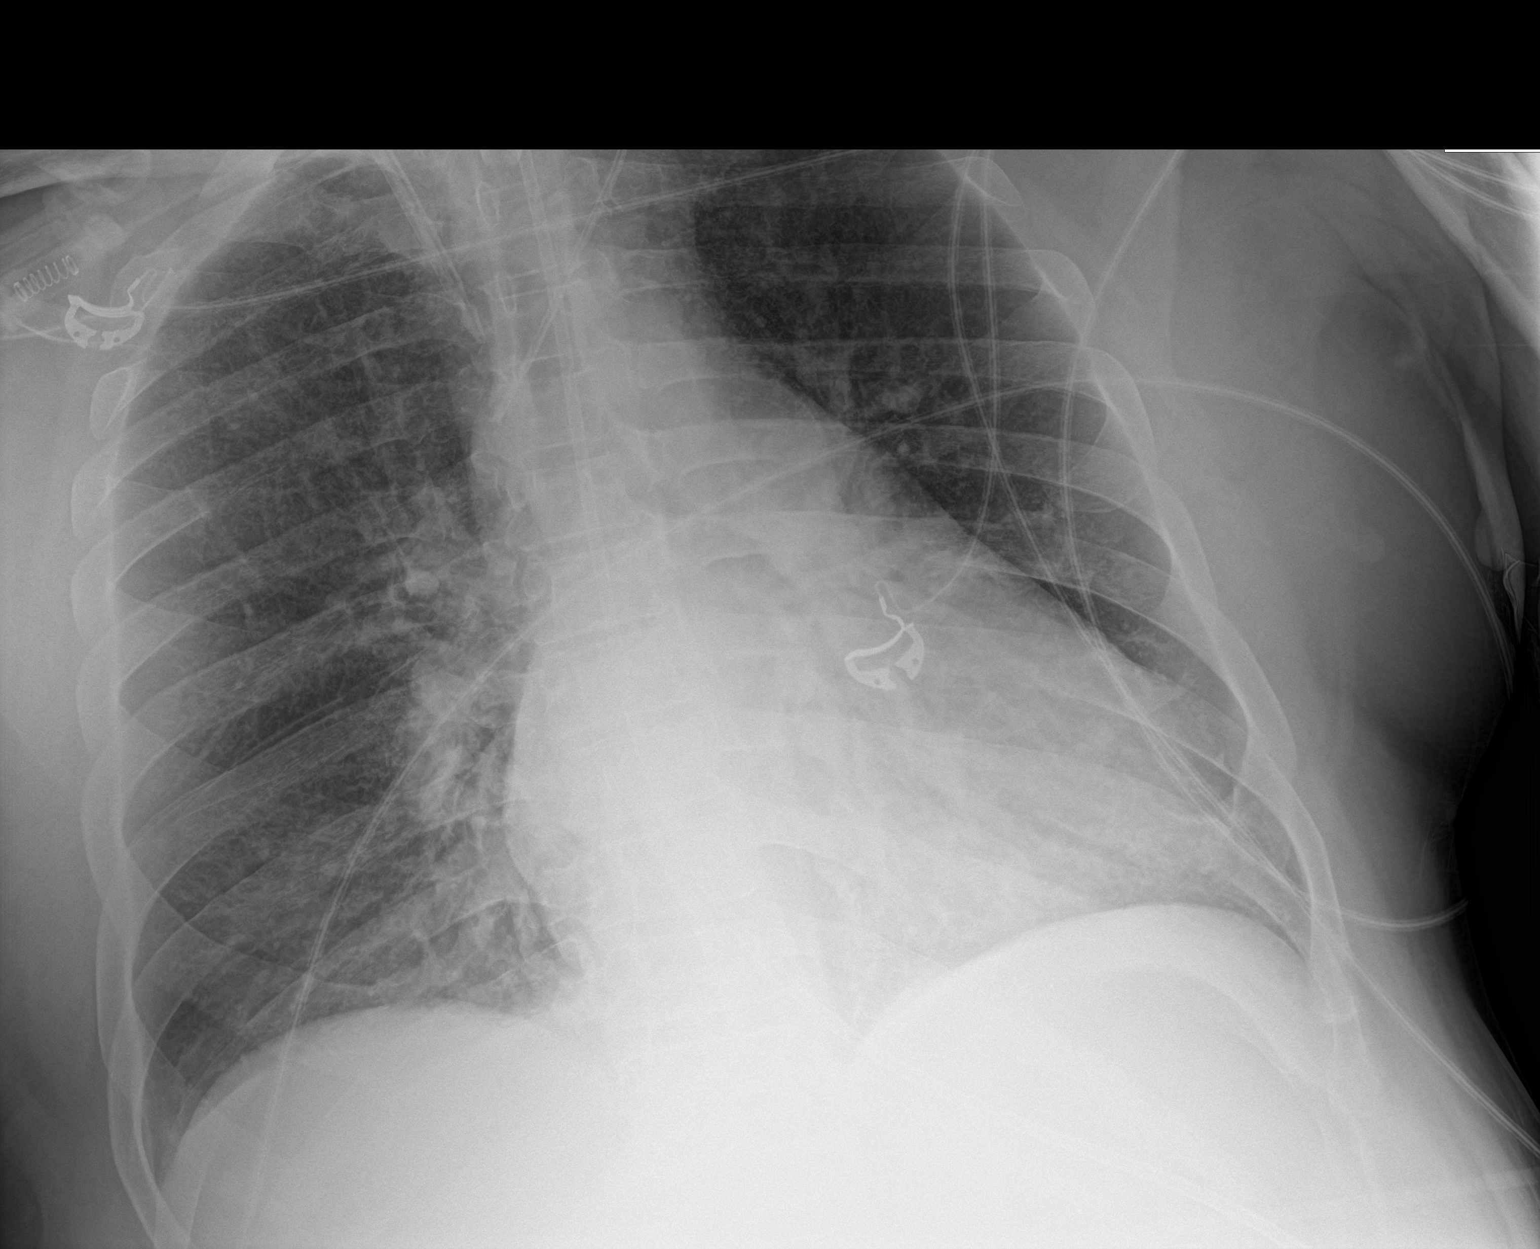

[1 of 1 positions shown; findings below may reference images not displayed]

FINDINGS: Tracheostomy is midline. Right IJ central line tip projects at the
junction of the brachiocephalic veins. Left IJ central line tip is
likely in the upper SVC. Feeding tube is followed into the stomach
with the tip projecting beyond the inferior boundary of the image.

Heart size stable. There is a fine pattern of interstitial
prominence bilaterally, likely due to emphysema when compared with
07/23/2017. Bibasilar aeration has improved from yesterday, however.
There may be trace right pleural fluid.
IMPRESSION: 1. Improving bibasilar aeration with residual airspace
opacification, possibly due to atelectasis. Pneumonia is not
excluded.
2. Probable trace right pleural effusion.
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
## 2018-07-29 DIAGNOSIS — M5136 Other intervertebral disc degeneration, lumbar region: Secondary | ICD-10-CM | POA: Diagnosis not present

## 2018-07-29 DIAGNOSIS — Z79899 Other long term (current) drug therapy: Secondary | ICD-10-CM | POA: Diagnosis not present

## 2018-07-29 DIAGNOSIS — G894 Chronic pain syndrome: Secondary | ICD-10-CM | POA: Diagnosis not present

## 2018-07-30 IMAGING — DX DG CHEST 1V PORT
1 series · 2 of 2 positions shown · non-contrast
Comparison: 10/14/2017

CLINICAL DATA: Respiratory failure

EXAM:
PORTABLE CHEST 1 VIEW

[Series 1: chest ap · 0.14mm/px · 2 of 2 slices shown]
[im 1/2]
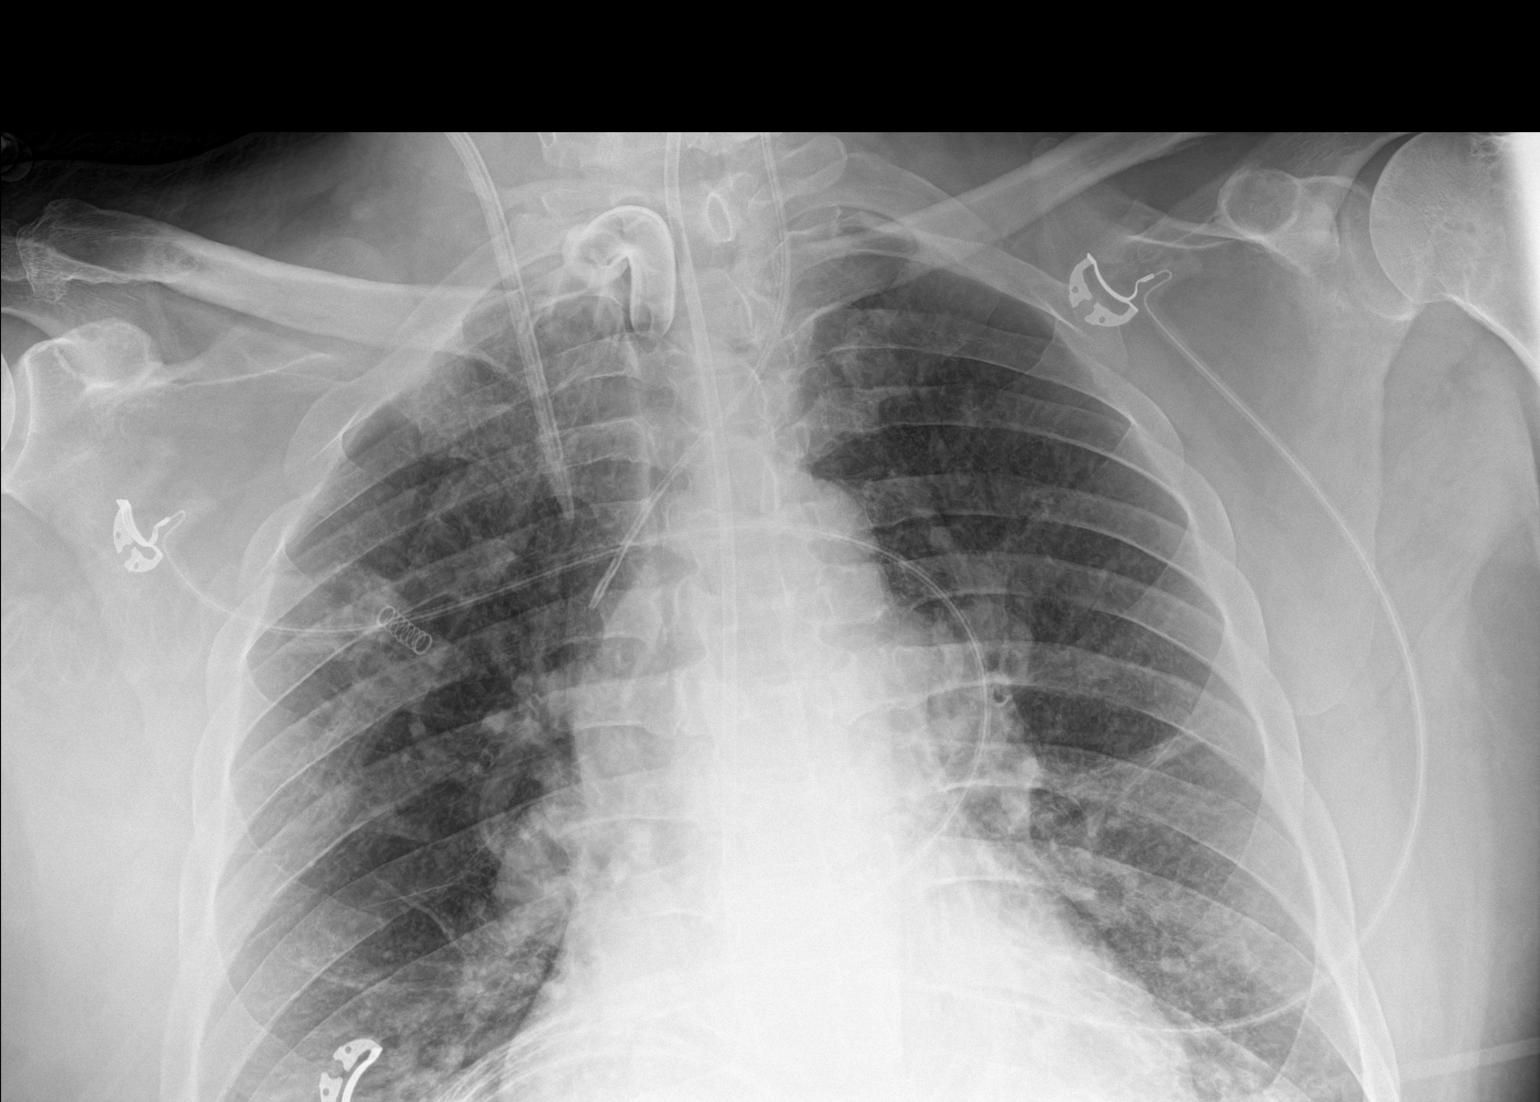
[im 2/2]
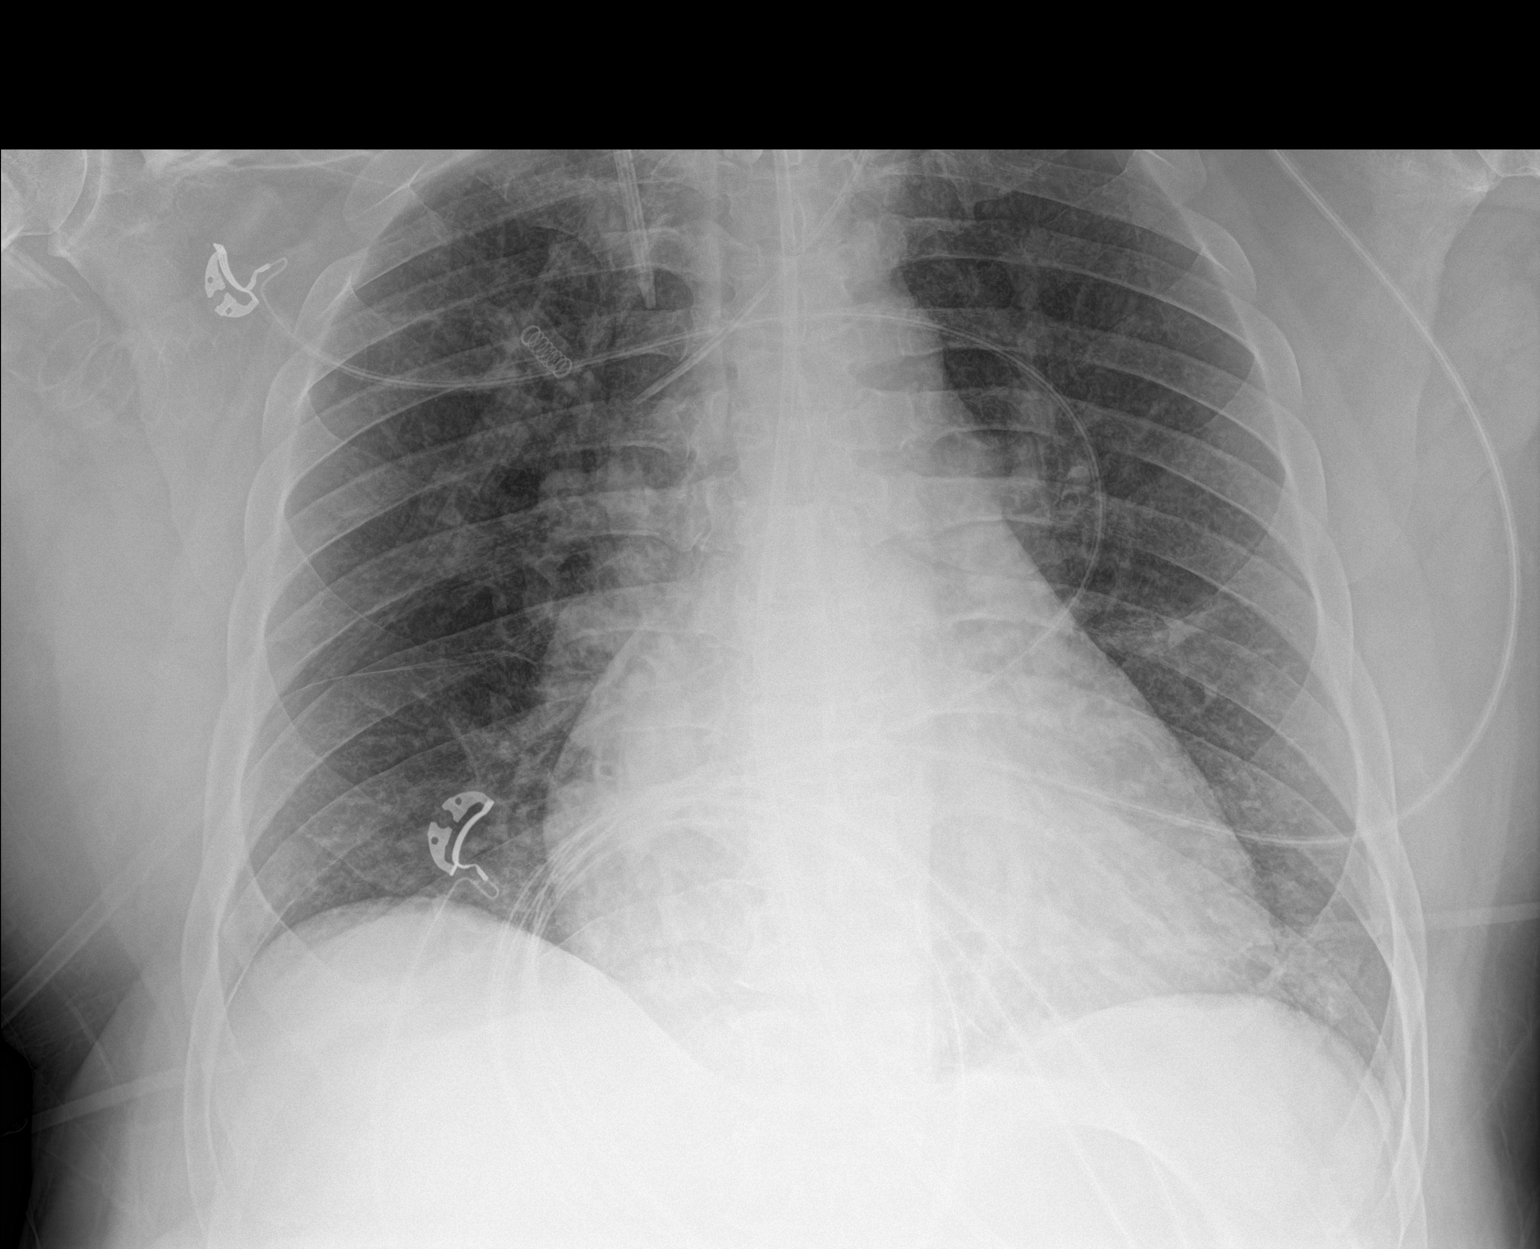

[2 of 2 positions shown; findings below may reference images not displayed]

FINDINGS: Tracheostomy in good position. Bilateral central venous catheter
tips in the SVC unchanged. Feeding tube in place with the tip not
visualized.

Cardiac enlargement with mild vascular congestion which has improved
in the interval. Improvement in bibasilar airspace disease. No
pleural effusion
IMPRESSION: Improvement in bilateral airspace disease. Support lines remain in
good position.

## 2018-08-02 ENCOUNTER — Ambulatory Visit: Payer: Self-pay | Admitting: Nurse Practitioner

## 2018-08-02 IMAGING — DX DG CHEST 1V PORT
1 series · 1 of 1 positions shown · non-contrast
Comparison: October 17, 2017

CLINICAL DATA: Respiratory failure

EXAM:
PORTABLE CHEST 1 VIEW

[chest ap]
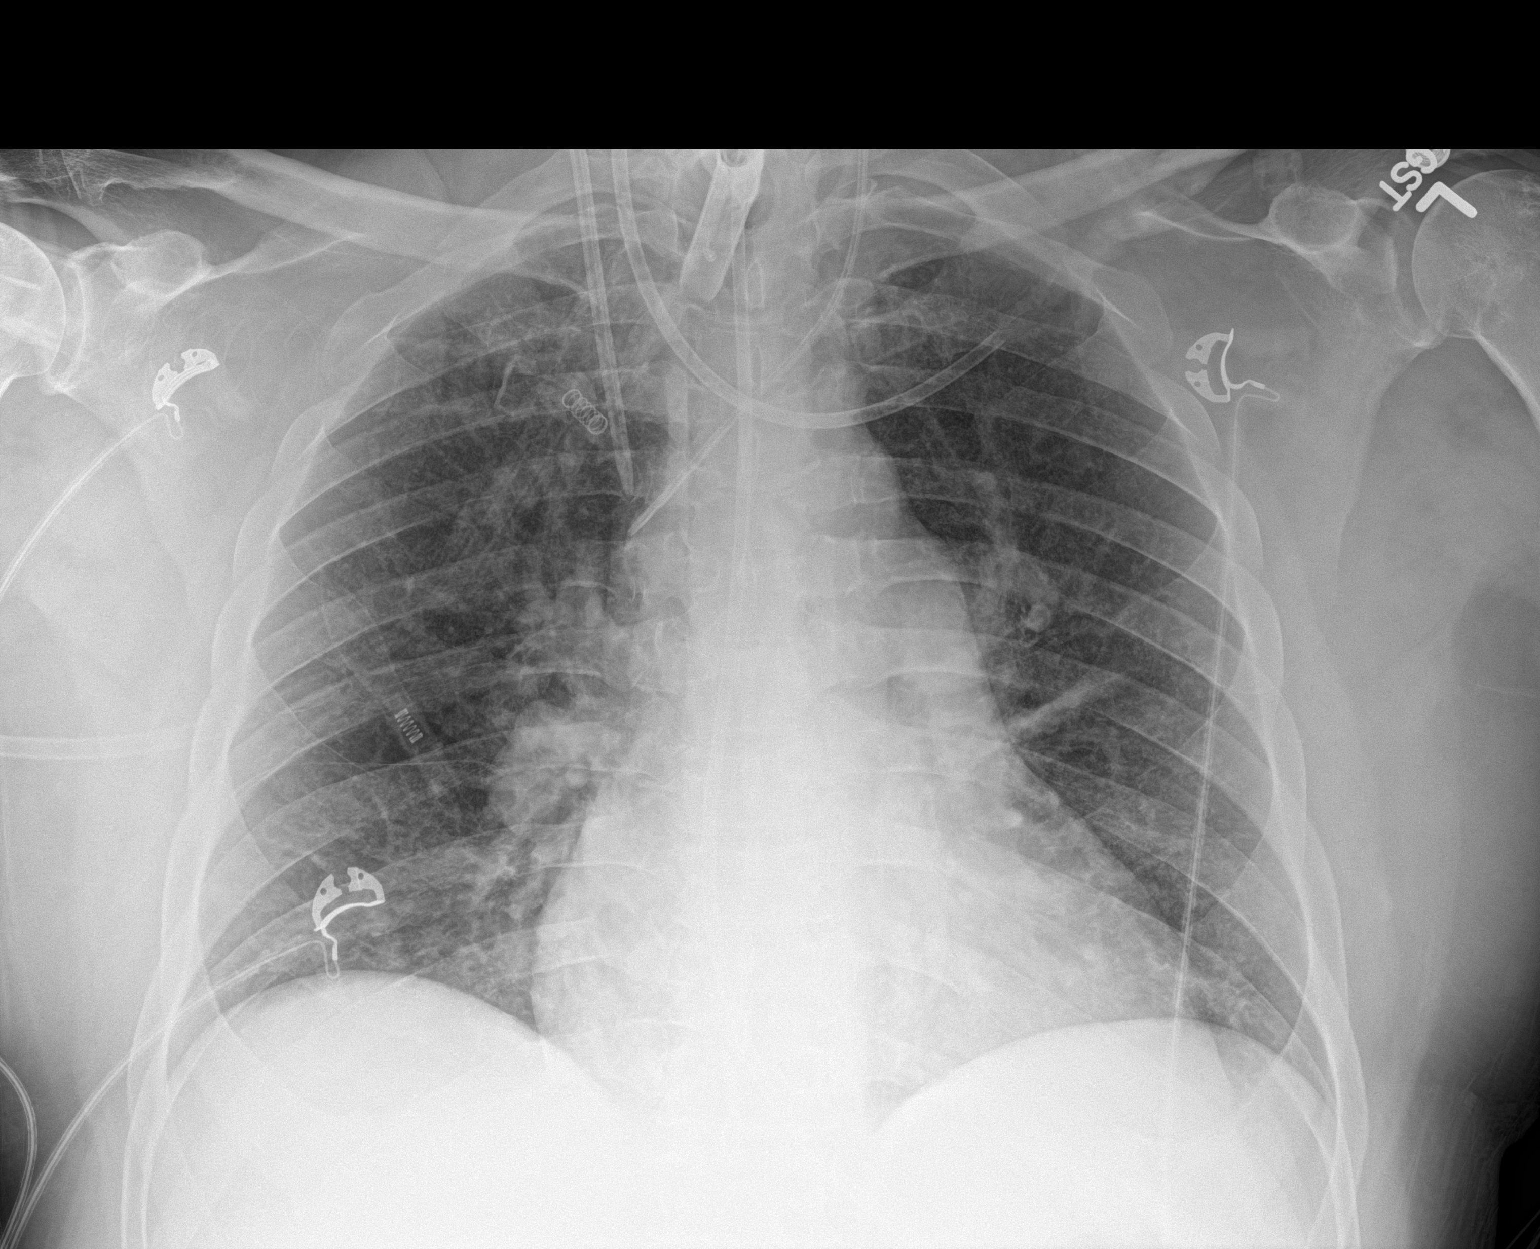

[1 of 1 positions shown; findings below may reference images not displayed]

FINDINGS: Tracheostomy catheter tip is 7.2 cm above the carina. Feeding tube
tip is below the diaphragm. Right and left jugular catheter tips are
both in the superior vena cava. No pneumothorax.

There is atelectatic change in the left mid lung. Lungs elsewhere
clear. Heart is enlarged with pulmonary vascularity within normal
limits, stable. No adenopathy. No appreciable bone lesions.
IMPRESSION: Tube and catheter positions as described without pneumothorax.
Stable cardiac prominence. Left midlung atelectasis. No edema or
consolidation.

## 2018-08-03 IMAGING — DX DG CHEST 1V PORT
1 series · 1 of 1 positions shown · non-contrast
Comparison: Chest x-ray of 10/19/2017

CLINICAL DATA: Respiratory failure, shortness of breath

EXAM:
PORTABLE CHEST 1 VIEW

[chest]
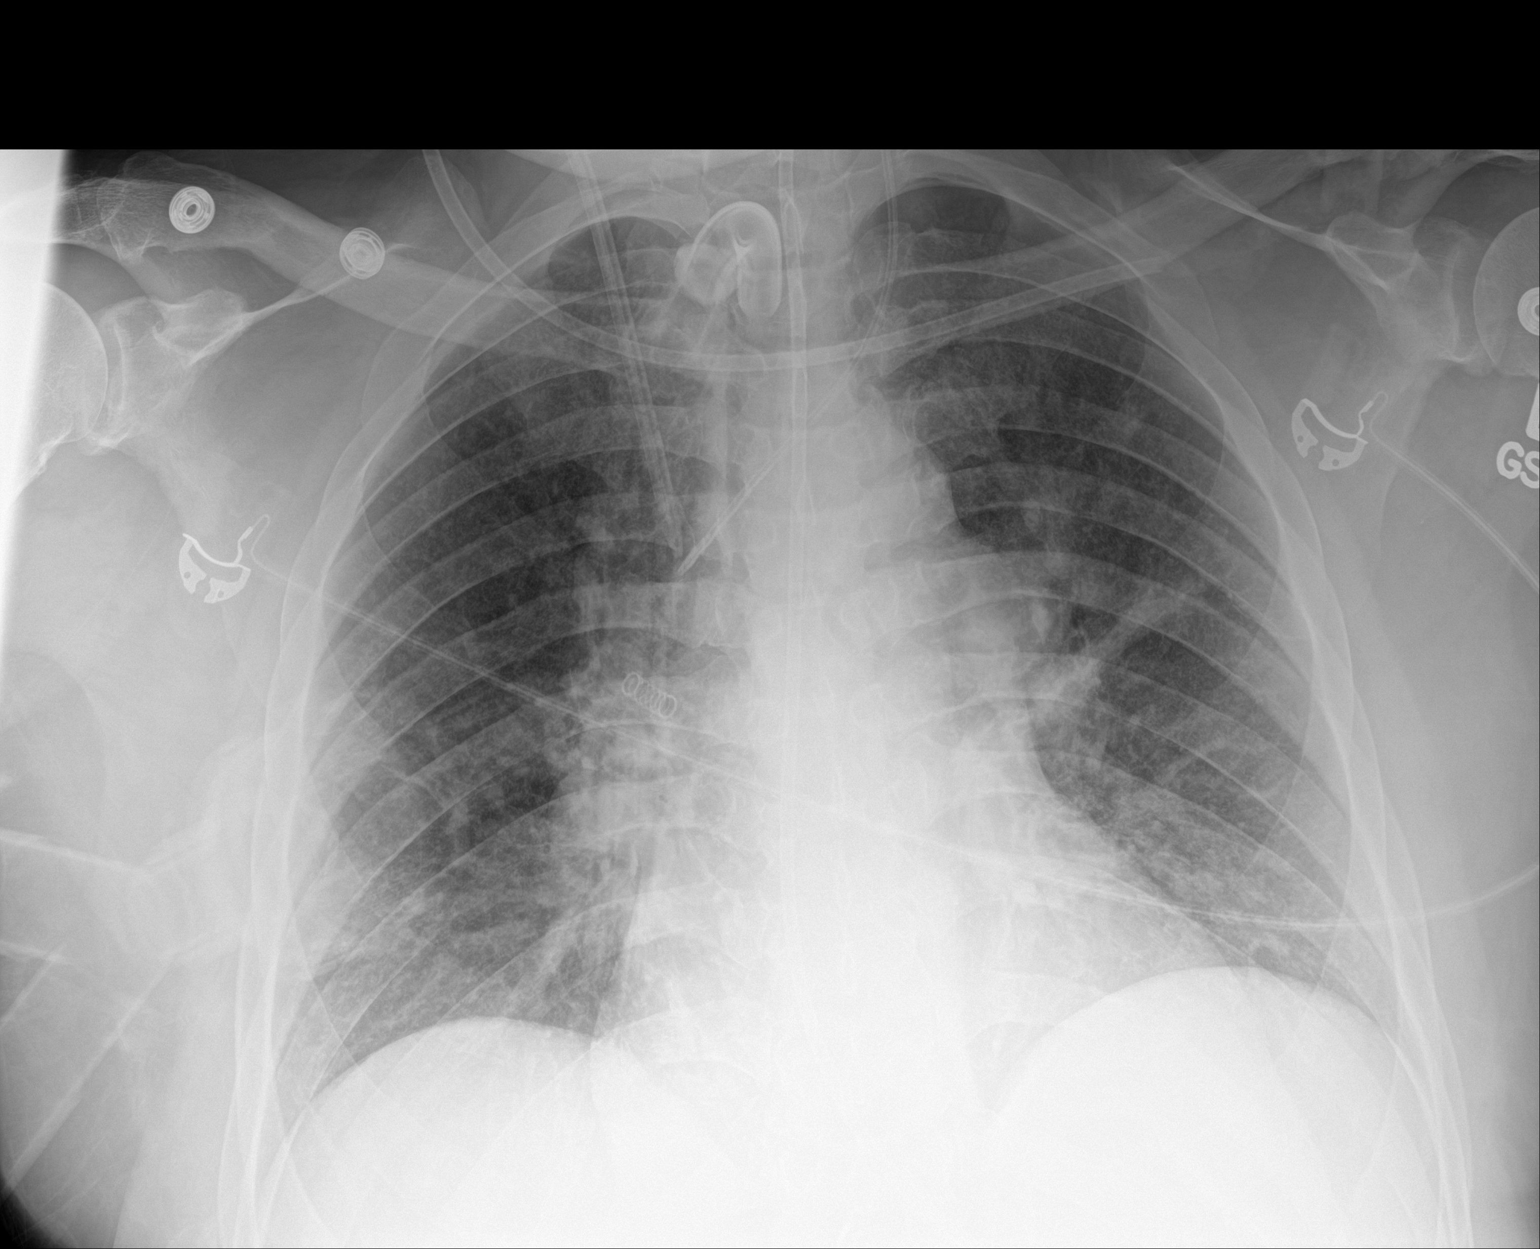

[1 of 1 positions shown; findings below may reference images not displayed]

FINDINGS: Tracheostomy remains in place and central venous lines are
unchanged. Cardiomegaly mild haziness of the perihilar vasculature
is again noted which may indicate mild pulmonary vascular
congestion. Mild volume loss is noted at the lung bases. Feeding
tube is present.
IMPRESSION: 1. No change in tracheostomy and central venous lines.
2. There is cardiomegaly present with mild pulmonary vascular
congestion. No definite pneumonia or pleural effusion is evident.

## 2018-08-06 ENCOUNTER — Telehealth: Payer: Self-pay | Admitting: Family Medicine

## 2018-08-06 MED ORDER — ATORVASTATIN CALCIUM 40 MG PO TABS: 40.0000 mg | ORAL_TABLET | Freq: Every day | ORAL | 1 refills | Status: DC

## 2018-08-06 NOTE — Telephone Encounter (Signed)
I spoke with the patient regarding benefits of Statin therapy and he is agreeable to commencing Lipitor.

## 2018-08-07 IMAGING — XA IR US GUIDE VASC ACCESS RIGHT
1 series · 1 of 1 positions shown · non-contrast
Comparison: none

INDICATION: Acute kidney injury

[Series 1: fl (-) angio · 1 of 1 slices shown]
[im 1/1]
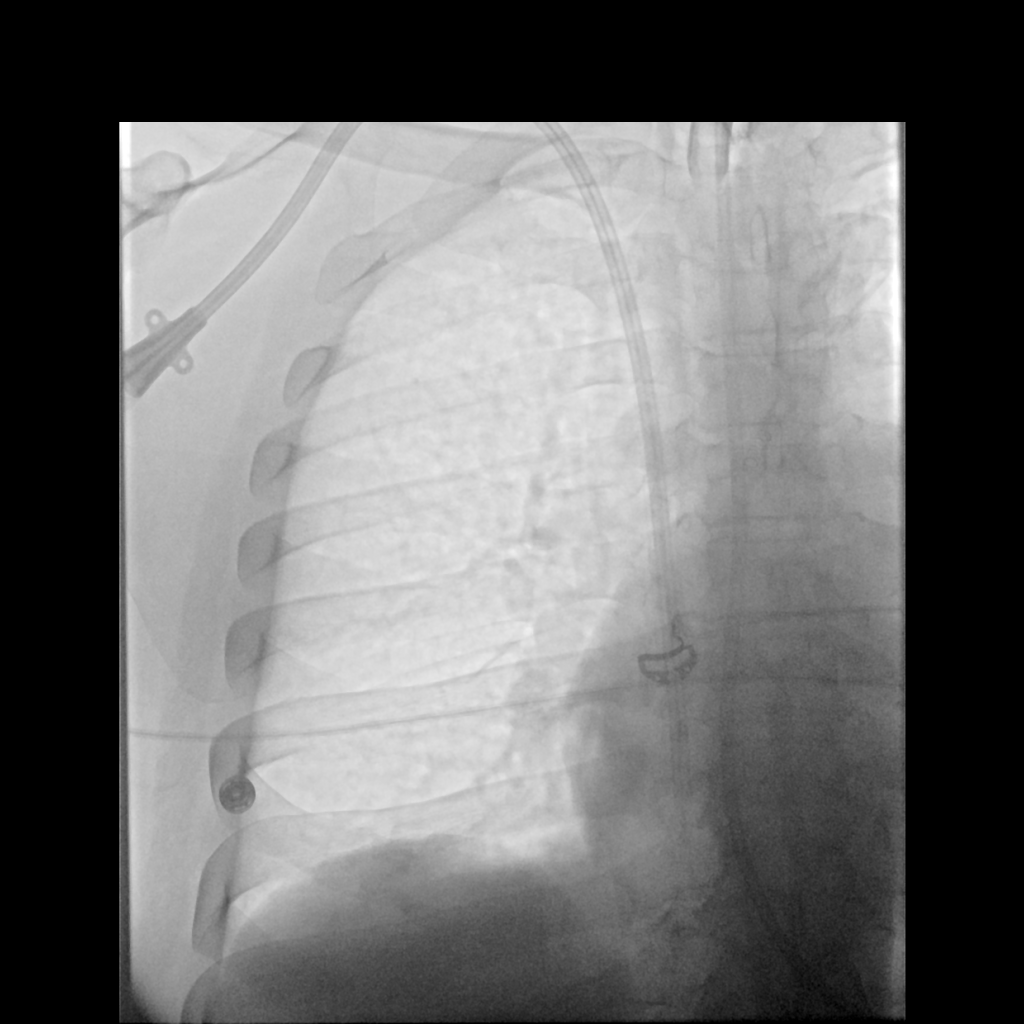

[1 of 1 positions shown; findings below may reference images not displayed]

EXAM:
ULTRASOUND GUIDANCE FOR VASCULAR ACCESS

RIGHT INTERNAL JUGULAR PERMANENT HEMODIALYSIS CATHETER

Radiologist:  [place], [place]

Guidance:  Ultrasound and fluoroscopic

FLUOROSCOPY TIME:  Fluoroscopy Time:  48 seconds (7 mGy).

MEDICATIONS:
Ancef 2 g administered within 1 hour of the procedure

ANESTHESIA/SEDATION:
Versed 1.0 mg IV; Fentanyl  50 mcg IV;

Moderate Sedation Time:  15 minutes

The patient was continuously monitored during the procedure by the
interventional radiology nurse under my direct supervision.

CONTRAST:  None.

COMPLICATIONS:
None immediate.

PROCEDURE:
Informed consent was obtained from the patient following explanation
of the procedure, risks, benefits and alternatives. The patient
understands, agrees and consents for the procedure. All questions
were addressed. A time out was performed.

Maximal barrier sterile technique utilized including caps, mask,
sterile gowns, sterile gloves, large sterile drape, hand hygiene,
and 2% chlorhexidine scrub.

Under sterile conditions and local anesthesia, right internal
jugular micropuncture venous access was performed with ultrasound.
Images were obtained for documentation. A guide wire was inserted
followed by a transitional dilator. Next, a 0.035 guidewire was
advanced into the IVC with a 5-French catheter. Measurements were
obtained from the right venotomy site to the proximal right atrium.
In the right infraclavicular chest, a subcutaneous tunnel was
created under sterile conditions and local anesthesia. 1% lidocaine
with epinephrine was utilized for this. The 27 cm tip to cuff
dialysis catheter was tunneled subcutaneously to the venotomy site
and inserted into the SVC/RA junction through a valved peel-away
sheath. Position was confirmed with fluoroscopy. Images were
obtained for documentation. Blood was aspirated from the catheter
followed by saline and heparin flushes. The appropriate volume and
strength of heparin was instilled in each lumen. Caps were applied.
The catheter was secured at the tunnel site with Gelfoam and a
pursestring suture. The venotomy site was closed with subcuticular
Vicryl suture. Dermabond was applied to the small right neck
incision. A dry sterile dressing was applied. The catheter is ready
for use. No immediate complications.
IMPRESSION: Ultrasound and fluoroscopically guided right internal jugular
tunneled hemodialysis catheter (27 cm tip to cuff dialysis
catheter).

## 2018-08-10 IMAGING — DX DG ABD PORTABLE 1V
1 series · 1 of 1 positions shown · non-contrast
Comparison: None.

CLINICAL DATA: Check feeding catheter placement

EXAM:
PORTABLE ABDOMEN - 1 VIEW

[abdomen kub]
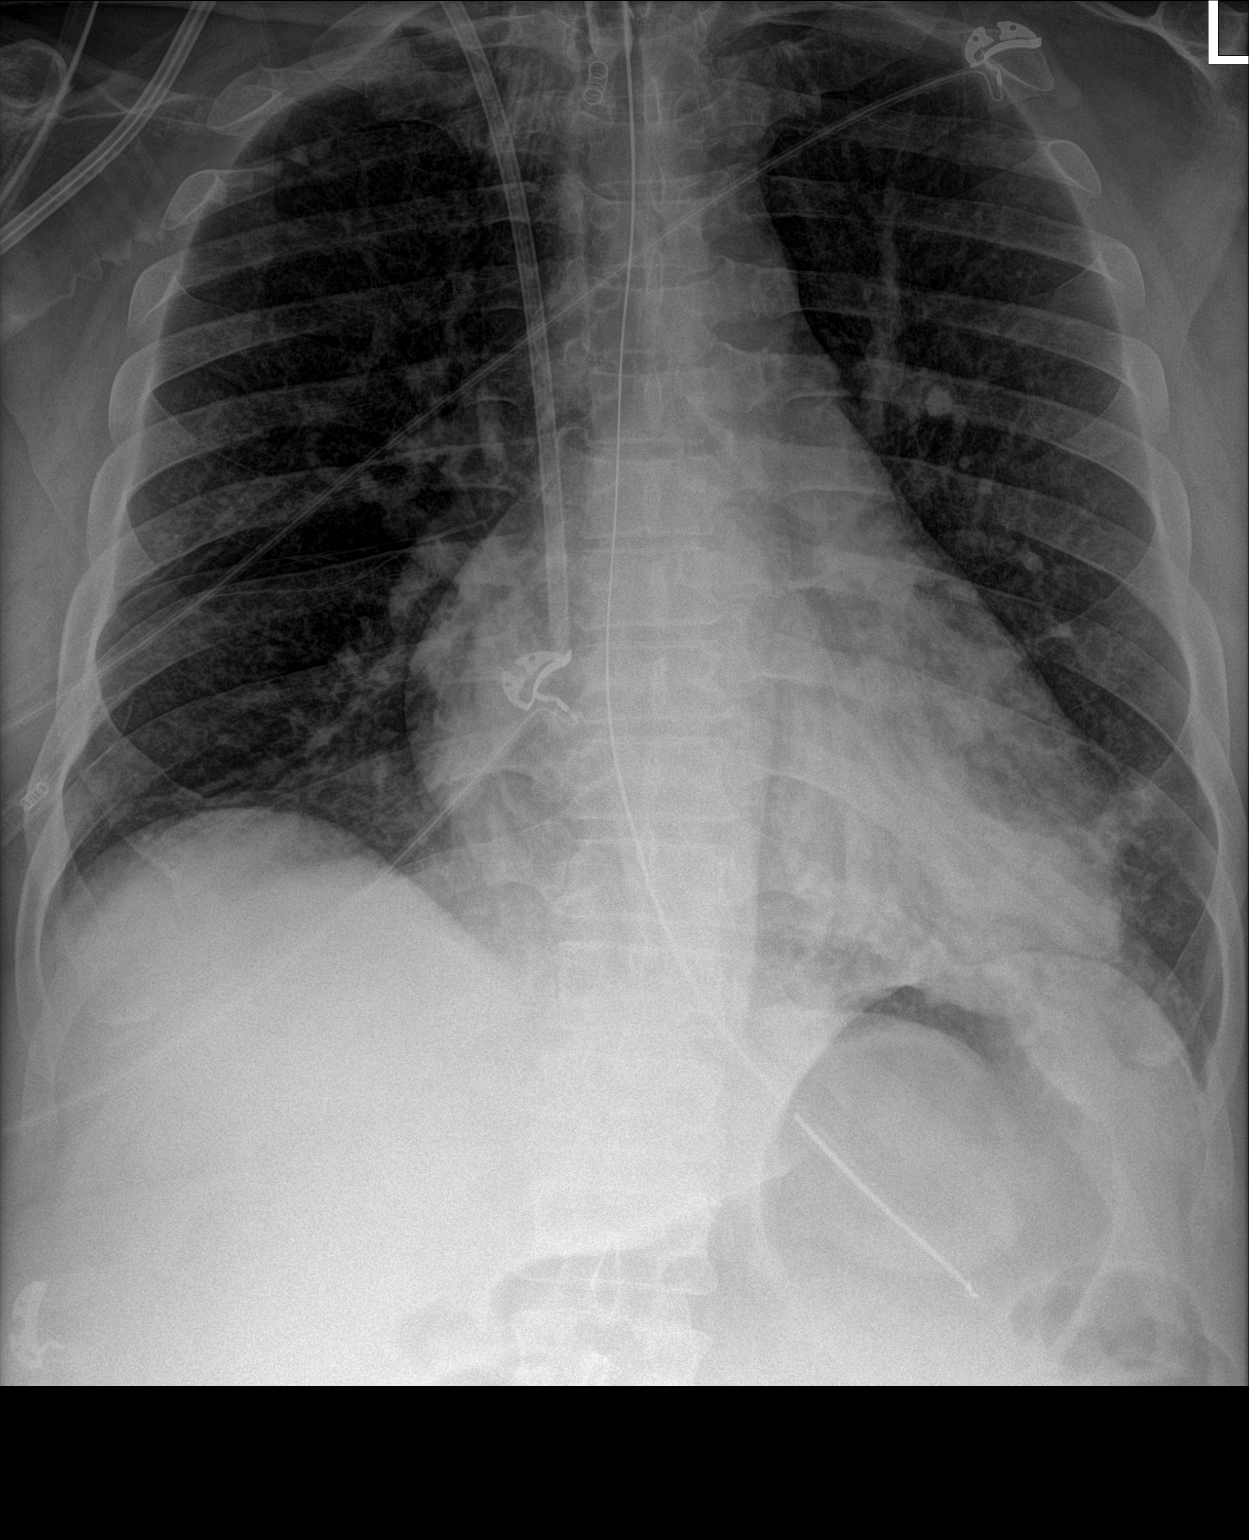

[1 of 1 positions shown; findings below may reference images not displayed]

FINDINGS: Nasogastric catheter is noted extending into the stomach. The
proximal side port is noted at the level of the GE junction. This
should be advanced several cm.
IMPRESSION: Nasogastric catheter as described. This should be advanced several
cm.

## 2018-08-16 DIAGNOSIS — J449 Chronic obstructive pulmonary disease, unspecified: Secondary | ICD-10-CM | POA: Diagnosis not present

## 2018-08-16 DIAGNOSIS — J129 Viral pneumonia, unspecified: Secondary | ICD-10-CM | POA: Diagnosis not present

## 2018-08-17 ENCOUNTER — Ambulatory Visit: Payer: Self-pay | Admitting: Internal Medicine

## 2018-08-18 DIAGNOSIS — R69 Illness, unspecified: Secondary | ICD-10-CM | POA: Diagnosis not present

## 2018-08-18 DIAGNOSIS — N5201 Erectile dysfunction due to arterial insufficiency: Secondary | ICD-10-CM | POA: Diagnosis not present

## 2018-08-24 ENCOUNTER — Encounter: Payer: Self-pay | Admitting: Nurse Practitioner

## 2018-08-24 ENCOUNTER — Ambulatory Visit: Payer: Medicare HMO | Attending: Nurse Practitioner | Admitting: Nurse Practitioner

## 2018-08-24 VITALS — BP 136/89 | HR 87 | Temp 98.8°F | Resp 20 | Ht 69.0 in | Wt 257.0 lb

## 2018-08-24 DIAGNOSIS — I10 Essential (primary) hypertension: Secondary | ICD-10-CM | POA: Diagnosis not present

## 2018-08-24 DIAGNOSIS — J9612 Chronic respiratory failure with hypercapnia: Secondary | ICD-10-CM | POA: Diagnosis not present

## 2018-08-24 DIAGNOSIS — J9611 Chronic respiratory failure with hypoxia: Secondary | ICD-10-CM | POA: Diagnosis not present

## 2018-08-24 DIAGNOSIS — F4321 Adjustment disorder with depressed mood: Secondary | ICD-10-CM

## 2018-08-24 DIAGNOSIS — E118 Type 2 diabetes mellitus with unspecified complications: Secondary | ICD-10-CM

## 2018-08-24 DIAGNOSIS — R69 Illness, unspecified: Secondary | ICD-10-CM | POA: Diagnosis not present

## 2018-08-24 DIAGNOSIS — Z794 Long term (current) use of insulin: Secondary | ICD-10-CM | POA: Diagnosis not present

## 2018-08-24 LAB — POCT GLYCOSYLATED HEMOGLOBIN (HGB A1C): HbA1c, POC (controlled diabetic range): 7.6 % — AB (ref 0.0–7.0)

## 2018-08-24 LAB — GLUCOSE, POCT (MANUAL RESULT ENTRY): POC Glucose: 228 mg/dl — AB (ref 70–99)

## 2018-08-24 MED ORDER — DULOXETINE HCL 30 MG PO CPEP: 30.0000 mg | ORAL_CAPSULE | Freq: Every day | ORAL | 1 refills | Status: DC
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
## 2018-08-24 NOTE — Progress Notes (Signed)
F/u DM 

## 2018-08-25 LAB — CMP14+EGFR
ALT: 17 IU/L (ref 0–44)
AST: 22 IU/L (ref 0–40)
Albumin/Globulin Ratio: 1 — ABNORMAL LOW (ref 1.2–2.2)
Albumin: 4 g/dL (ref 3.5–5.5)
Alkaline Phosphatase: 67 IU/L (ref 39–117)
BUN/Creatinine Ratio: 6 — ABNORMAL LOW (ref 9–20)
BUN: 9 mg/dL (ref 6–24)
Bilirubin Total: 0.4 mg/dL (ref 0.0–1.2)
CO2: 20 mmol/L (ref 20–29)
Calcium: 9.2 mg/dL (ref 8.7–10.2)
Chloride: 101 mmol/L (ref 96–106)
Creatinine, Ser: 1.39 mg/dL — ABNORMAL HIGH (ref 0.76–1.27)
GFR calc Af Amer: 67 mL/min/{1.73_m2} (ref >59)
GFR calc non Af Amer: 58 mL/min/{1.73_m2} — ABNORMAL LOW (ref >59)
Globulin, Total: 3.9 g/dL (ref 1.5–4.5)
Glucose: 169 mg/dL — ABNORMAL HIGH (ref 65–99)
Potassium: 4.7 mmol/L (ref 3.5–5.2)
Sodium: 136 mmol/L (ref 134–144)
Total Protein: 7.9 g/dL (ref 6.0–8.5)

## 2018-08-26 DIAGNOSIS — Z79899 Other long term (current) drug therapy: Secondary | ICD-10-CM | POA: Diagnosis not present

## 2018-08-26 DIAGNOSIS — R69 Illness, unspecified: Secondary | ICD-10-CM | POA: Diagnosis not present

## 2018-08-26 DIAGNOSIS — G8921 Chronic pain due to trauma: Secondary | ICD-10-CM | POA: Diagnosis not present

## 2018-08-26 DIAGNOSIS — M5136 Other intervertebral disc degeneration, lumbar region: Secondary | ICD-10-CM | POA: Diagnosis not present

## 2018-08-29 ENCOUNTER — Encounter: Payer: Self-pay | Admitting: Nurse Practitioner

## 2018-09-02 ENCOUNTER — Other Ambulatory Visit: Payer: Self-pay | Admitting: Urology

## 2018-09-03 ENCOUNTER — Telehealth (INDEPENDENT_AMBULATORY_CARE_PROVIDER_SITE_OTHER): Payer: Self-pay

## 2018-09-03 NOTE — Telephone Encounter (Signed)
Patient is aware that kidney function has increased slightly. Recheck in 4 weeks. If kidney function continues to increase we may need to discontinue his metformin. Patient scheduled for lab only appointment to recheck kidney function on 2/14 at 10:30 am. Maryjean Morn, CMA

## 2018-09-03 NOTE — Telephone Encounter (Signed)
-----   Message from Claiborne Rigg, NP sent at 08/27/2018  3:58 PM EST ----- Kidney function has increased slightly. Will recheck in 4 weeks. If continues to increase we may need to discontinue your metformin. Please make a lab appointment

## 2018-09-06 ENCOUNTER — Ambulatory Visit: Payer: Self-pay | Admitting: General Surgery

## 2018-09-16 DIAGNOSIS — J129 Viral pneumonia, unspecified: Secondary | ICD-10-CM | POA: Diagnosis not present

## 2018-09-16 DIAGNOSIS — J449 Chronic obstructive pulmonary disease, unspecified: Secondary | ICD-10-CM | POA: Diagnosis not present

## 2018-09-22 ENCOUNTER — Ambulatory Visit: Payer: Medicare HMO | Admitting: Internal Medicine

## 2018-09-23 ENCOUNTER — Ambulatory Visit: Payer: Self-pay | Admitting: Internal Medicine

## 2018-09-24 ENCOUNTER — Other Ambulatory Visit: Payer: Self-pay

## 2018-09-24 ENCOUNTER — Ambulatory Visit: Payer: Medicare HMO | Attending: Family Medicine

## 2018-09-24 DIAGNOSIS — I5032 Chronic diastolic (congestive) heart failure: Secondary | ICD-10-CM

## 2018-09-24 DIAGNOSIS — N179 Acute kidney failure, unspecified: Secondary | ICD-10-CM | POA: Diagnosis not present

## 2018-09-24 DIAGNOSIS — I10 Essential (primary) hypertension: Secondary | ICD-10-CM | POA: Diagnosis not present

## 2018-09-27 ENCOUNTER — Ambulatory Visit: Payer: Self-pay | Admitting: Internal Medicine

## 2018-09-27 IMAGING — DX DG CHEST 1V PORT
1 series · 2 of 2 positions shown · non-contrast
Comparison: 10/20/2017

CLINICAL DATA: SOB. Hx COPD with chronic home o2 use. Hx CHF, DM,
HTN.

EXAM:
PORTABLE CHEST 1 VIEW

[Series 1: chest ap · 0.14mm/px · 2 of 2 slices shown]
[im 1/2]
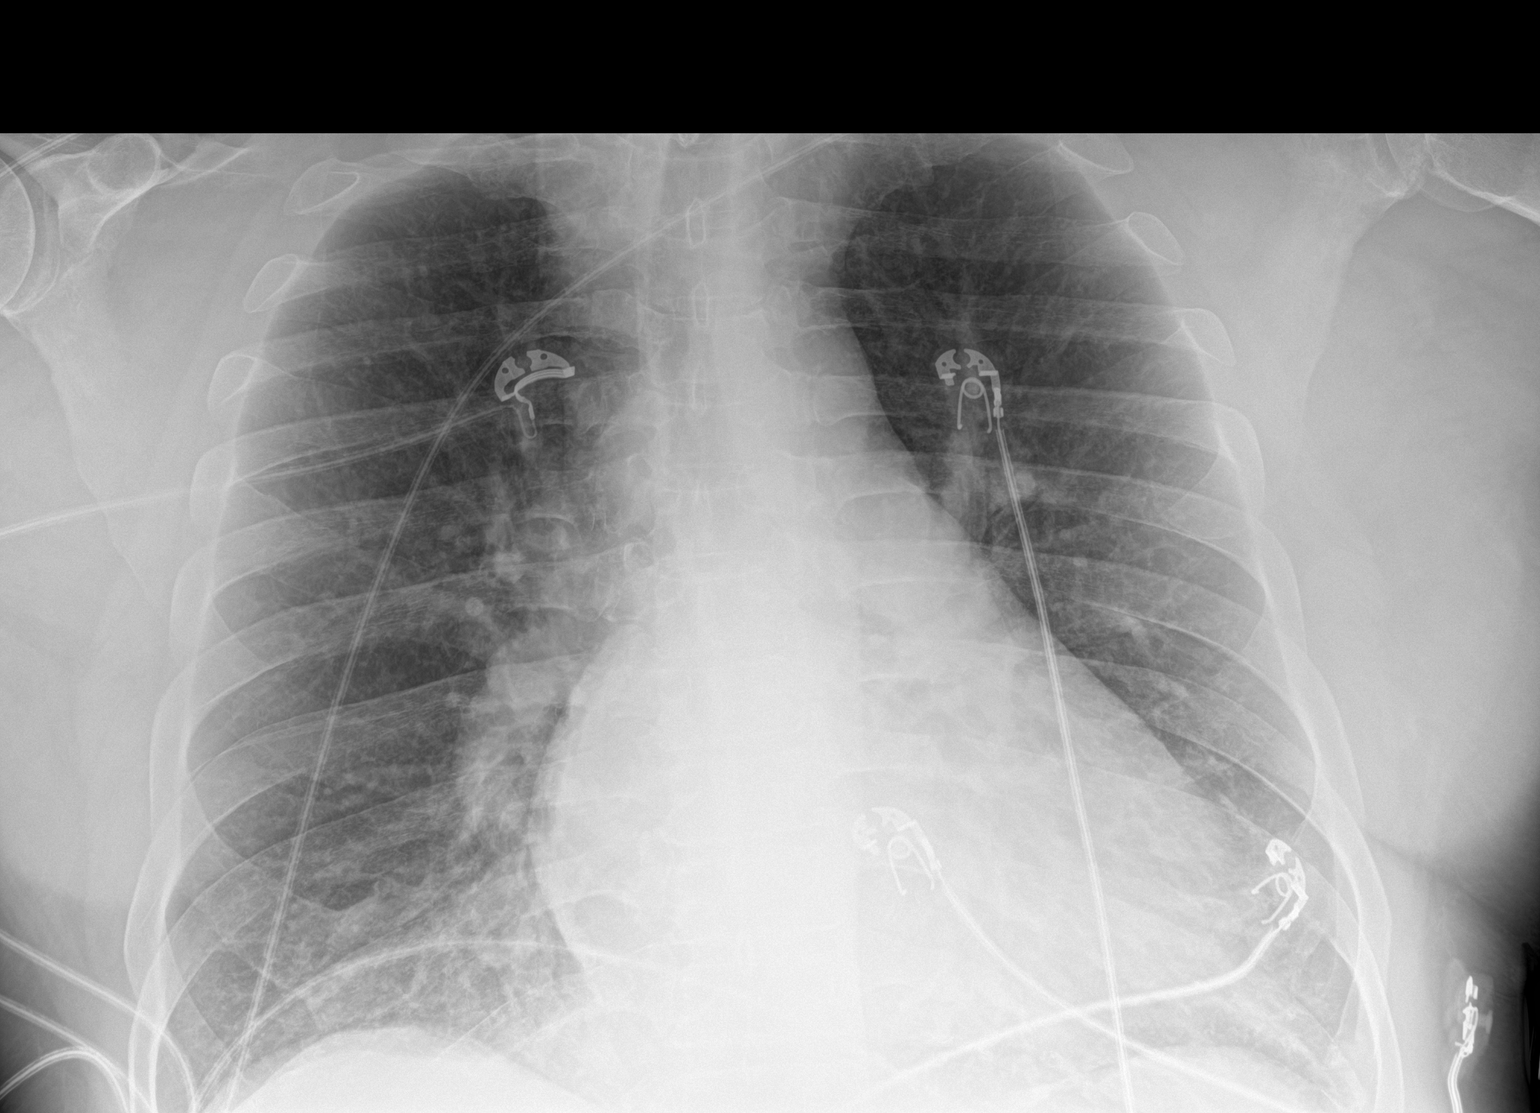
[im 2/2]
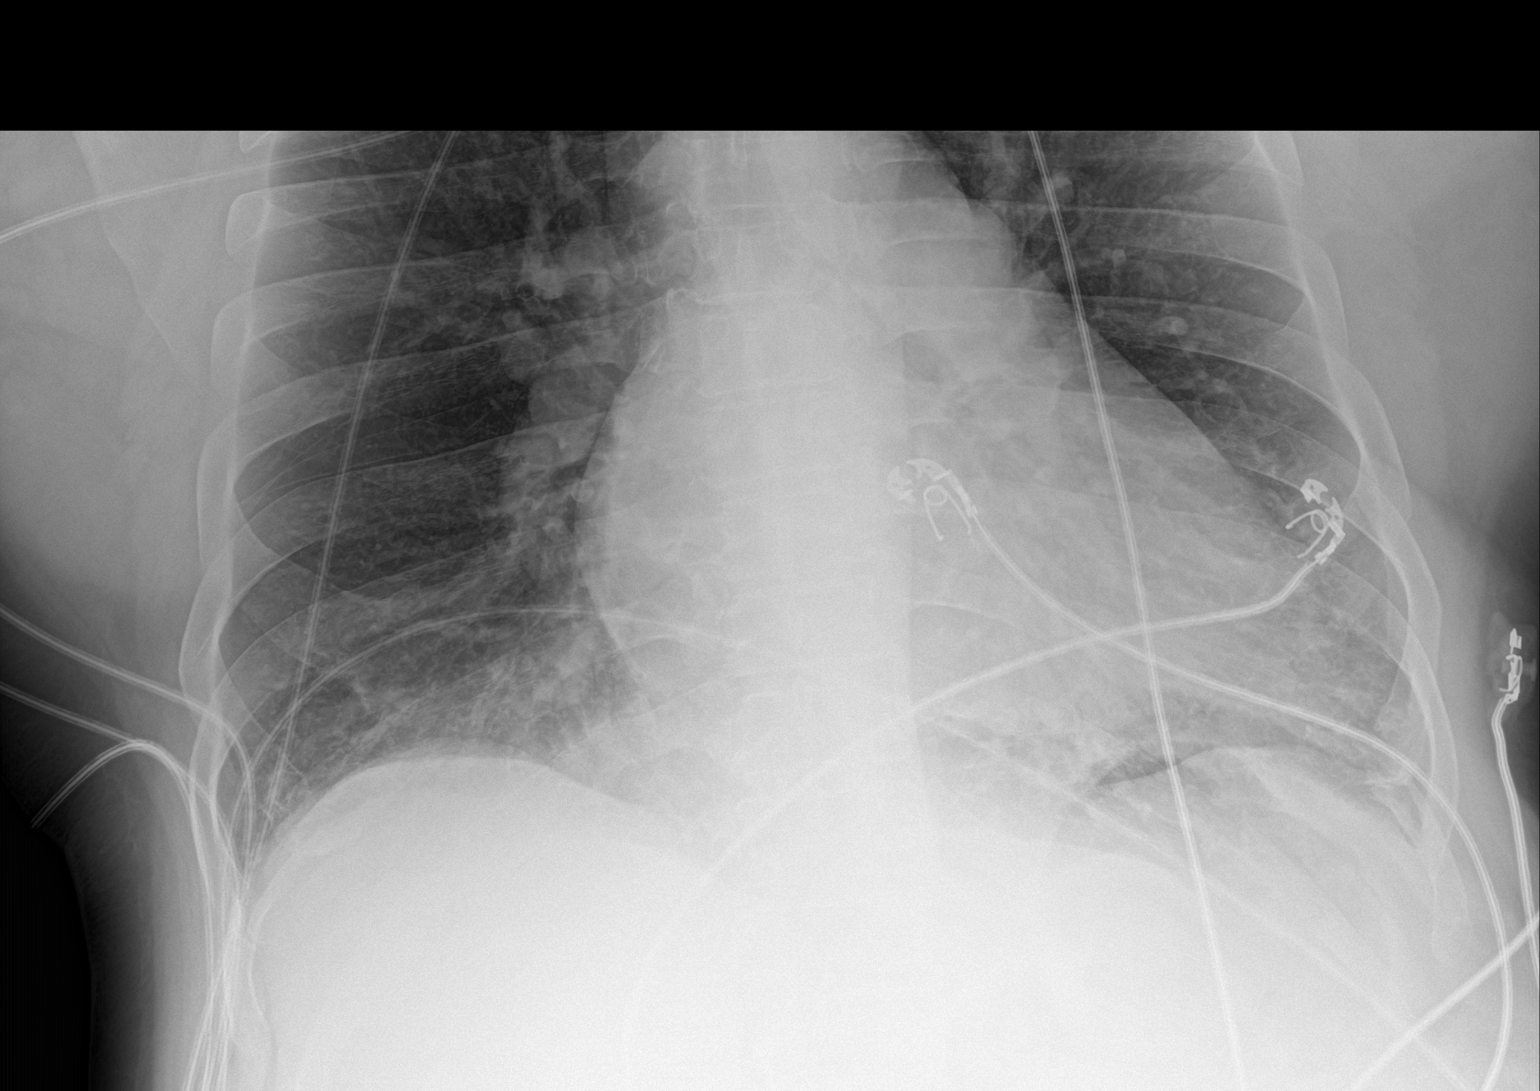

[2 of 2 positions shown; findings below may reference images not displayed]

FINDINGS: Cardiac silhouette is mildly enlarged.  No mediastinal or masses.

There is prominence of the bronchovascular markings lung base
interstitial thickening, similar to the prior exam. No convincing
pneumonia or pulmonary edema.

No pleural effusion or pneumothorax.

A possible right internal jugular central venous line not
well-defined on this image. The possible tip projects in the region
of the upper superior vena cava.

Skeletal structures are grossly intact.
IMPRESSION: No acute cardiopulmonary disease.

## 2018-09-28 ENCOUNTER — Telehealth: Payer: Self-pay

## 2018-09-28 LAB — CMP14+EGFR
ALT: 14 IU/L (ref 0–44)
AST: 12 IU/L (ref 0–40)
Albumin/Globulin Ratio: 1 — ABNORMAL LOW (ref 1.2–2.2)
Albumin: 4.1 g/dL (ref 3.8–4.9)
Alkaline Phosphatase: 83 IU/L (ref 39–117)
BUN/Creatinine Ratio: 13 (ref 9–20)
BUN: 22 mg/dL (ref 6–24)
Bilirubin Total: 0.2 mg/dL (ref 0.0–1.2)
CO2: 15 mmol/L — AB (ref 20–29)
Calcium: 9.1 mg/dL (ref 8.7–10.2)
Chloride: 89 mmol/L — ABNORMAL LOW (ref 96–106)
Creatinine, Ser: 1.76 mg/dL — ABNORMAL HIGH (ref 0.76–1.27)
GFR calc Af Amer: 50 mL/min/{1.73_m2} — ABNORMAL LOW (ref >59)
GFR calc non Af Amer: 43 mL/min/{1.73_m2} — ABNORMAL LOW (ref >59)
Globulin, Total: 4 g/dL (ref 1.5–4.5)
Glucose: 690 mg/dL (ref 65–99)
Potassium: 4.5 mmol/L (ref 3.5–5.2)
Sodium: 125 mmol/L — ABNORMAL LOW (ref 134–144)
Total Protein: 8.1 g/dL (ref 6.0–8.5)

## 2018-09-28 IMAGING — DX DG CHEST 1V PORT
1 series · 2 of 2 positions shown · non-contrast
Comparison: Chest radiograph 12/15/2017 at [DATE] a.m.

CLINICAL DATA: Endotracheal intubation

EXAM:
PORTABLE CHEST 1 VIEW

[Series 1: chest · 0.14mm/px · 2 of 2 slices shown]
[im 1/2]
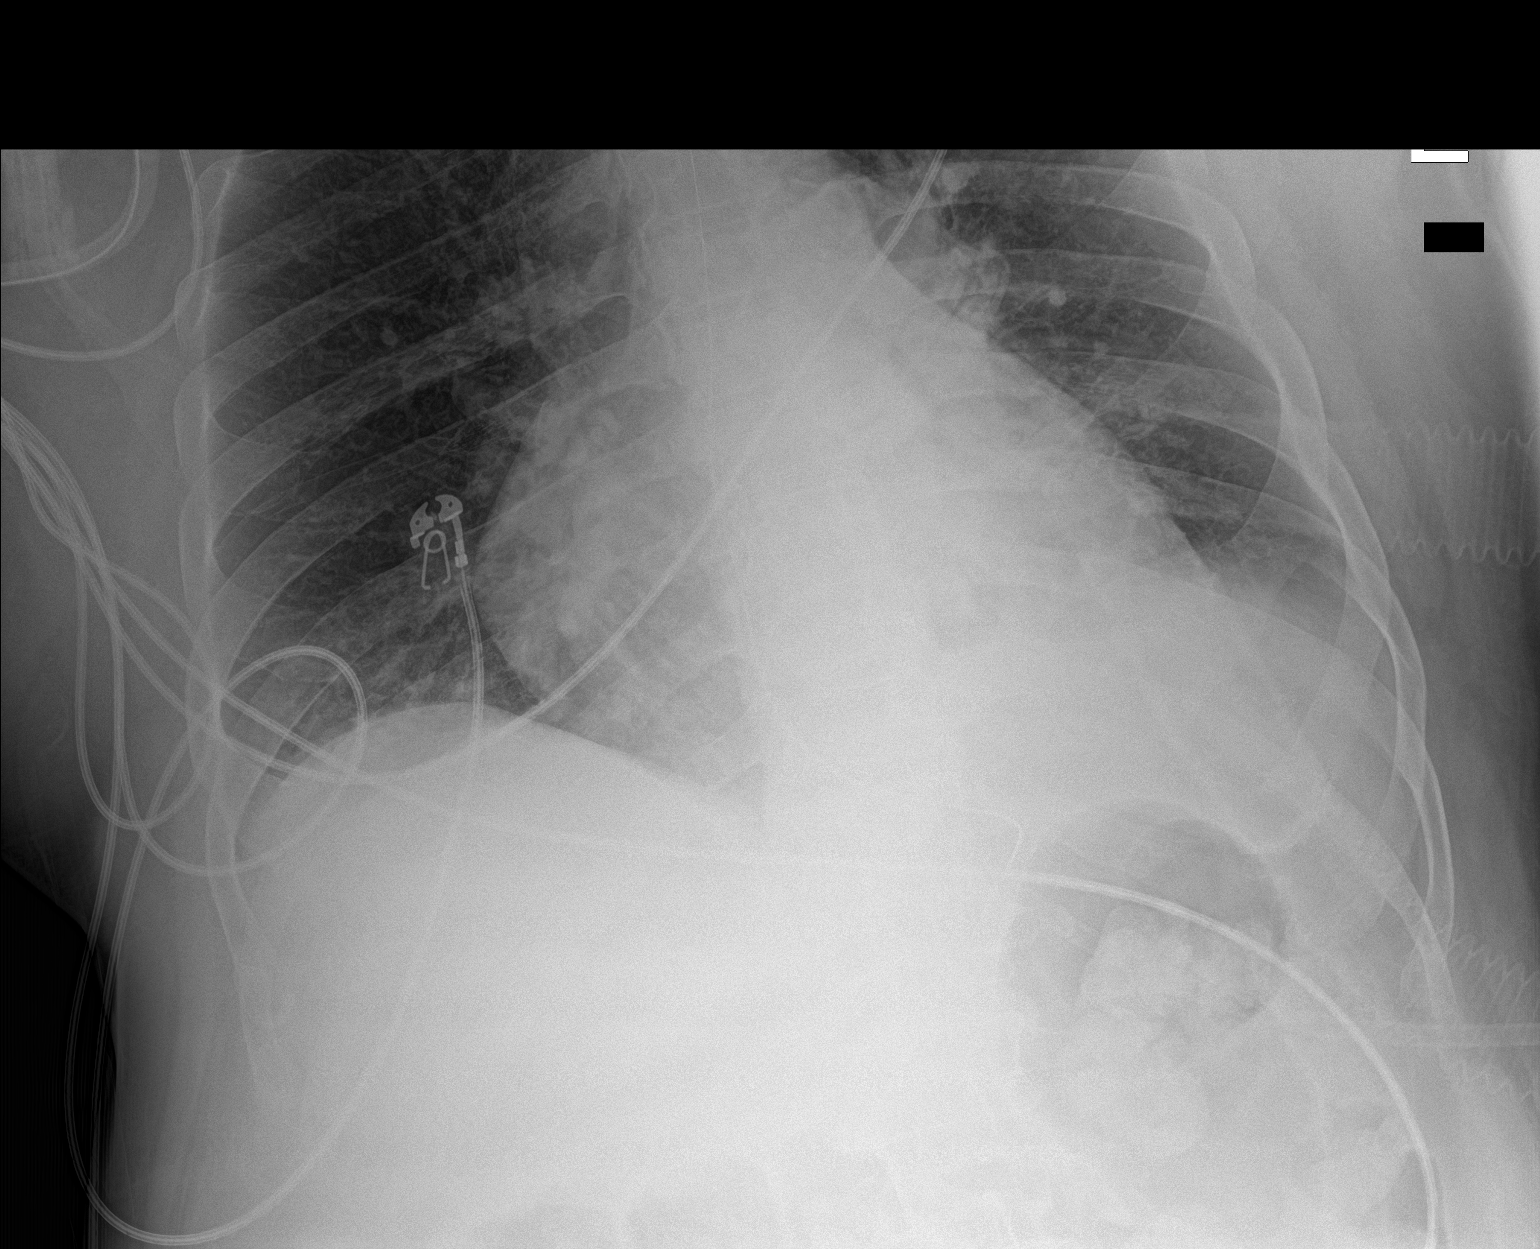
[im 2/2]
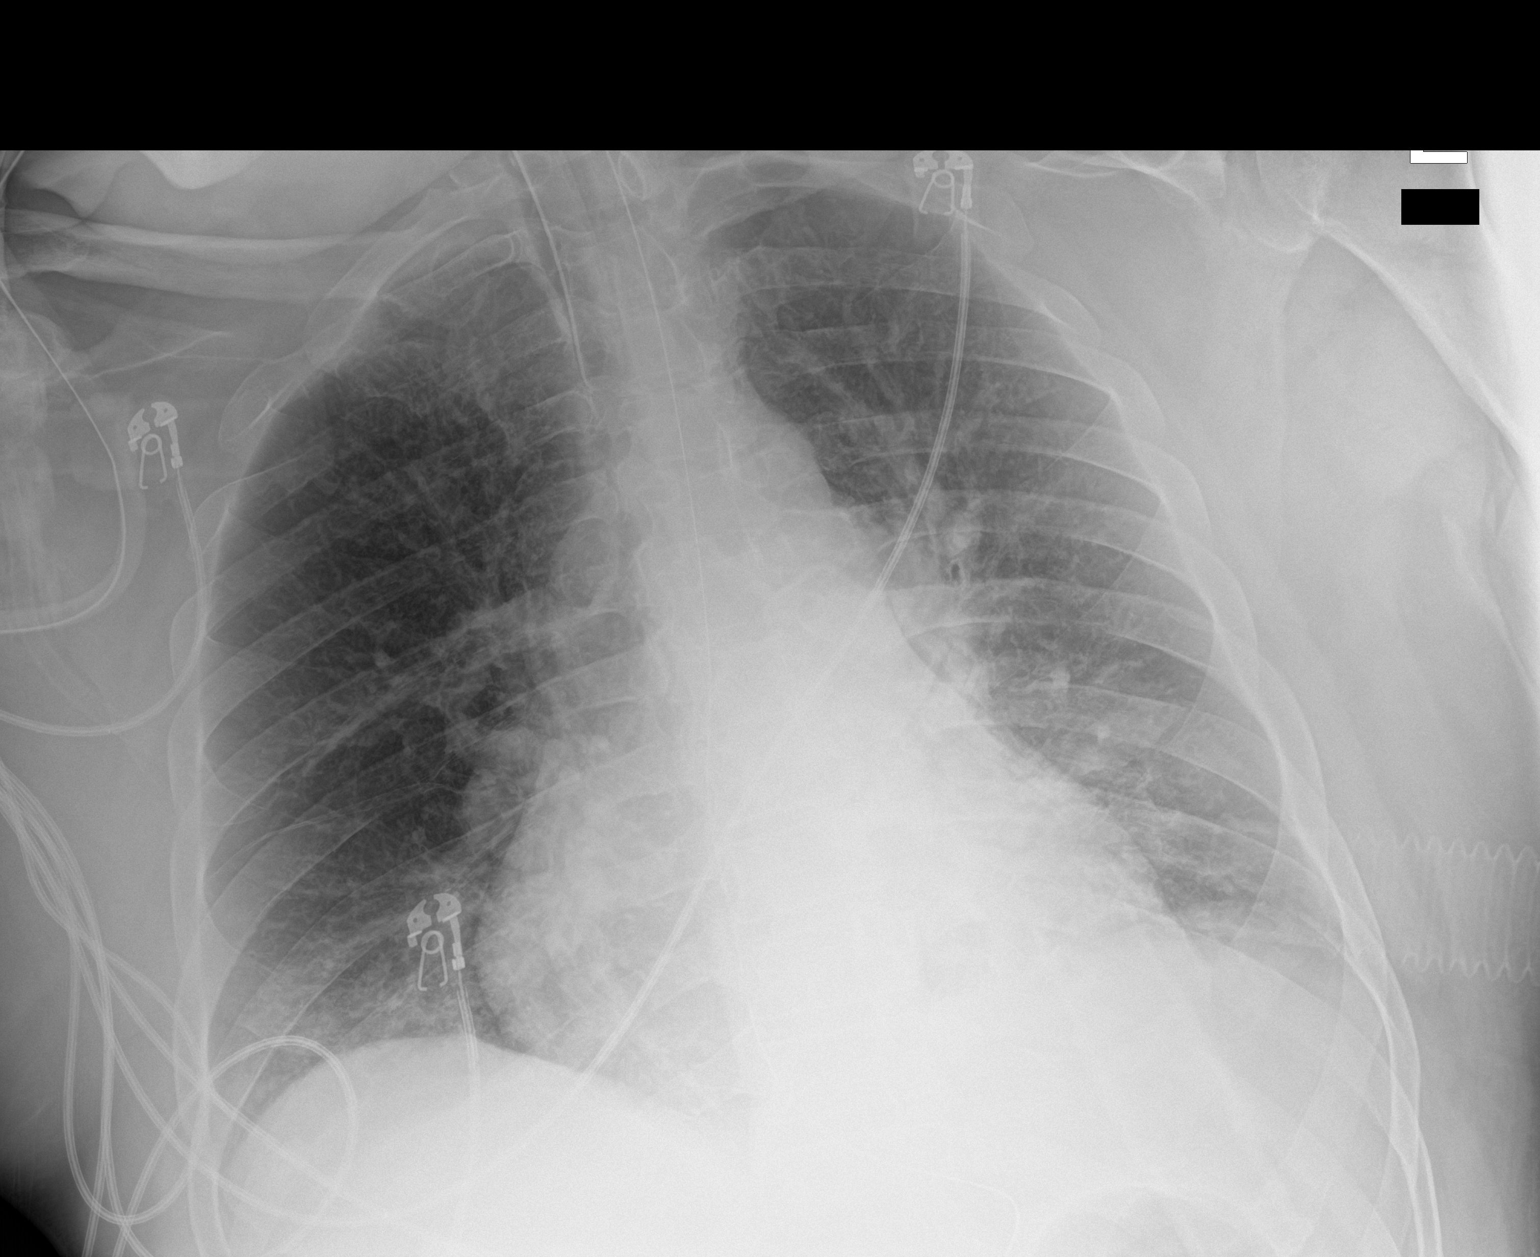

[2 of 2 positions shown; findings below may reference images not displayed]

FINDINGS: Endotracheal tube tip is below the level of the clavicular heads
approximately 3.2 cm above the carina. Orogastric tube courses below
the diaphragm. Mediastinal silhouette and lungs are unchanged.
Persistent bibasilar atelectasis.
IMPRESSION: Endotracheal tube tip approximately 3.2 cm above the carina.

## 2018-09-28 IMAGING — DX DG CHEST 1V PORT
1 series · 2 of 2 positions shown · non-contrast
Comparison: Chest x-ray of December 14, 2017

CLINICAL DATA: Onset of shortness of breath and acute respiratory
distress today. History of COPD, CHF, HIV, current smoker.

EXAM:
PORTABLE CHEST 1 VIEW

[Series 1: chest · 0.14mm/px · 2 of 2 slices shown]
[im 1/2]
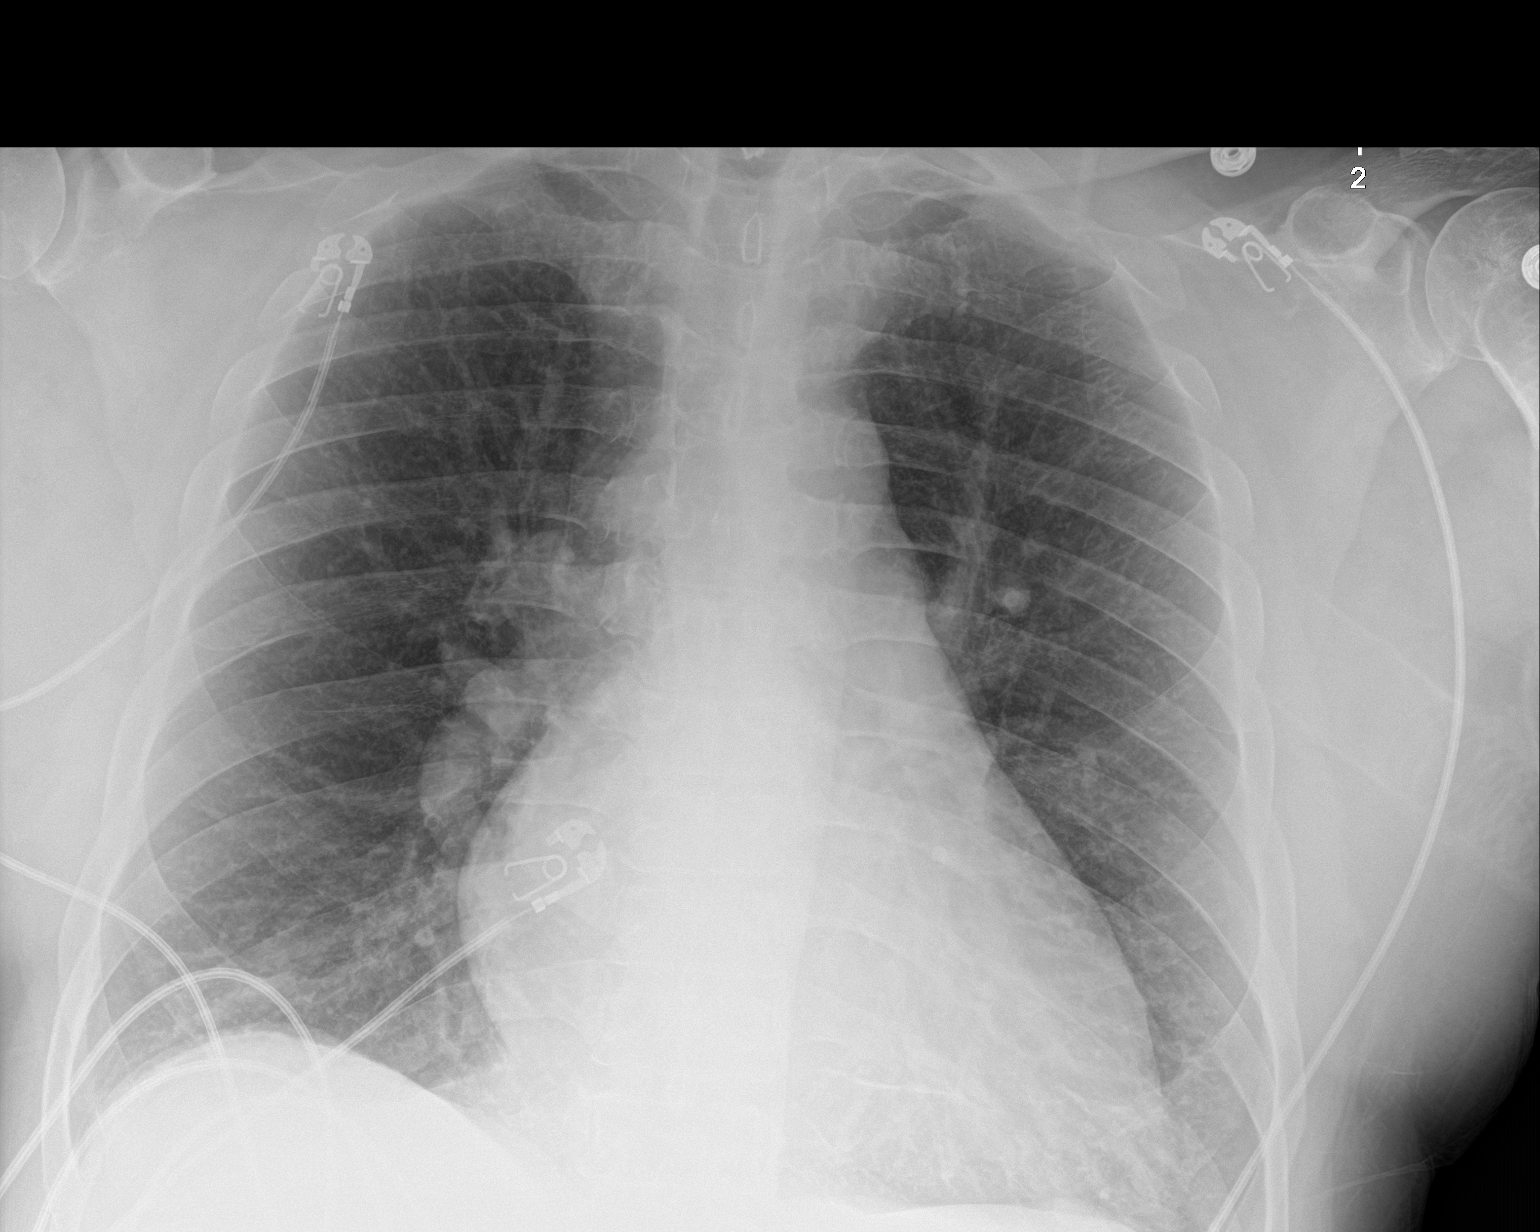
[im 2/2]
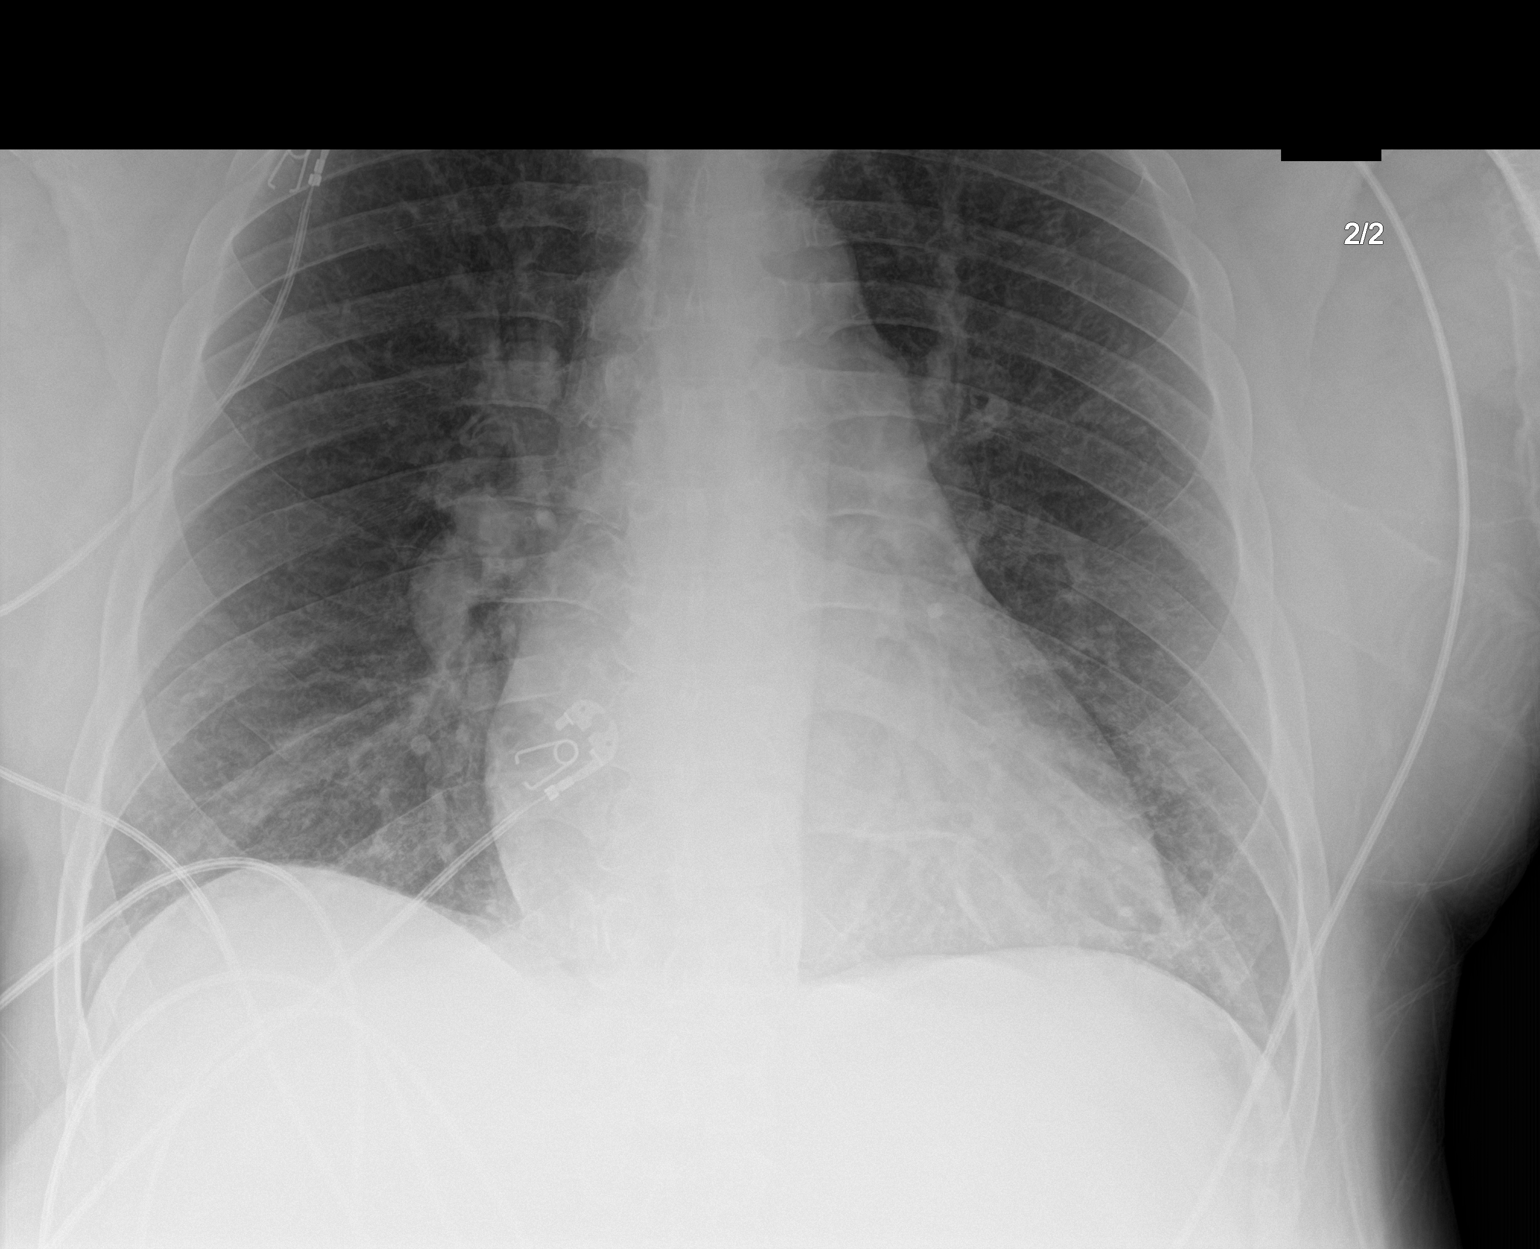

[2 of 2 positions shown; findings below may reference images not displayed]

FINDINGS: The lungs remain mildly hyperinflated. The interstitial markings are
coarse especially at the bases but have improved slightly since
yesterday's study. The cardiac silhouette is enlarged. The central
pulmonary vascularity is prominent. There is no pleural effusion.
IMPRESSION: Slight interval improvement in the appearance of bibasilar
atelectasis or pneumonia. Mild cardiomegaly with central pulmonary
vascular congestion.

## 2018-09-28 NOTE — Telephone Encounter (Signed)
NOTED

## 2018-09-28 NOTE — Telephone Encounter (Signed)
CMA spoke to patient to inform on results.   Pt. Verified DOB. Pt. Understood.  Patient stated he do not have a car at the moment to go to ED or come to the lab to get his lab redrawn. Patient stated his wife took the car to work.

## 2018-09-28 NOTE — Telephone Encounter (Signed)
-----   Message from Claiborne Rigg, NP sent at 09/27/2018 11:22 PM EST ----- Please let patient know glucose was extremely elevated/critical on blood work and sodium was low. I would like for him to be evaluated in the emergency room or have his labs repeated here in the office for a lab visit today.

## 2018-09-29 ENCOUNTER — Ambulatory Visit: Payer: Medicare HMO | Attending: Family Medicine

## 2018-09-29 DIAGNOSIS — E118 Type 2 diabetes mellitus with unspecified complications: Secondary | ICD-10-CM | POA: Diagnosis not present

## 2018-09-29 DIAGNOSIS — Z79899 Other long term (current) drug therapy: Secondary | ICD-10-CM | POA: Diagnosis not present

## 2018-09-29 DIAGNOSIS — Z794 Long term (current) use of insulin: Secondary | ICD-10-CM

## 2018-09-29 DIAGNOSIS — M5136 Other intervertebral disc degeneration, lumbar region: Secondary | ICD-10-CM | POA: Diagnosis not present

## 2018-09-29 DIAGNOSIS — G894 Chronic pain syndrome: Secondary | ICD-10-CM | POA: Diagnosis not present

## 2018-09-29 IMAGING — DX DG CHEST 1V PORT
2 series · 2 of 2 positions shown · non-contrast
Comparison: 12/15/2017

CLINICAL DATA: Endotracheal placement.

EXAM:
PORTABLE CHEST 1 VIEW

[chest ap (1 of 2)]
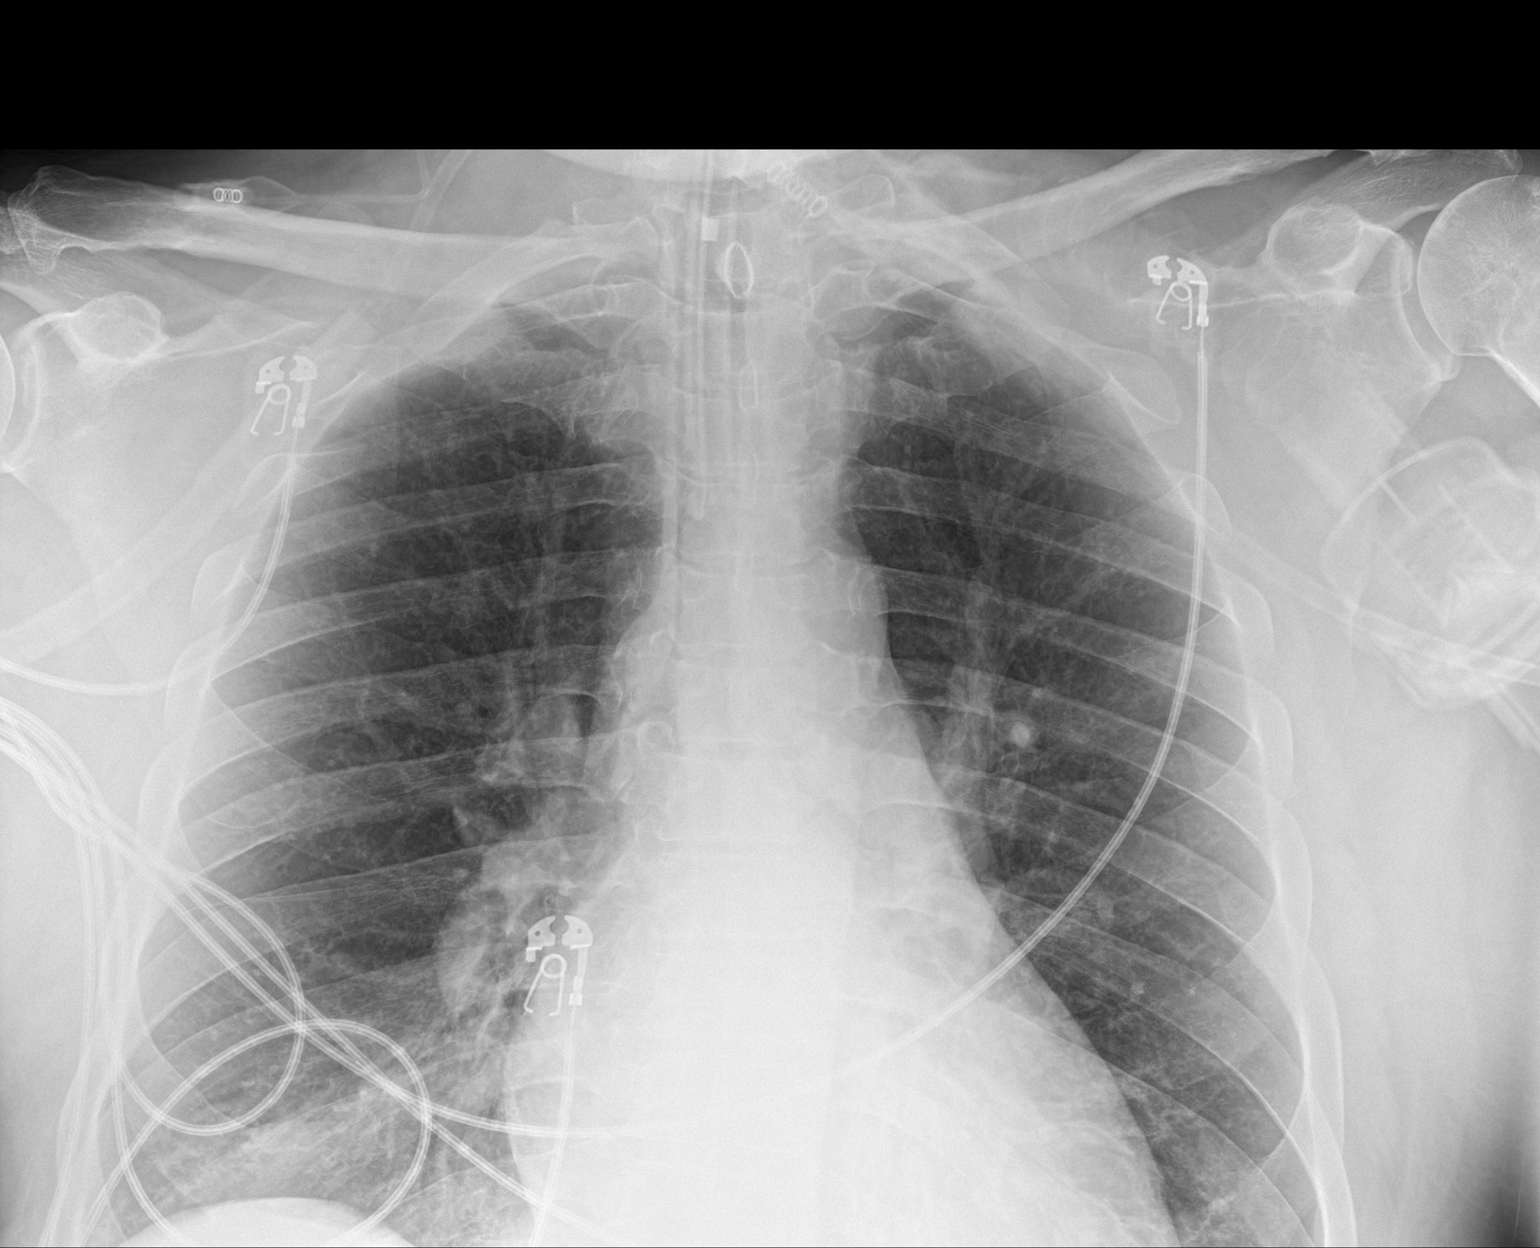

[chest ap (2 of 2)]
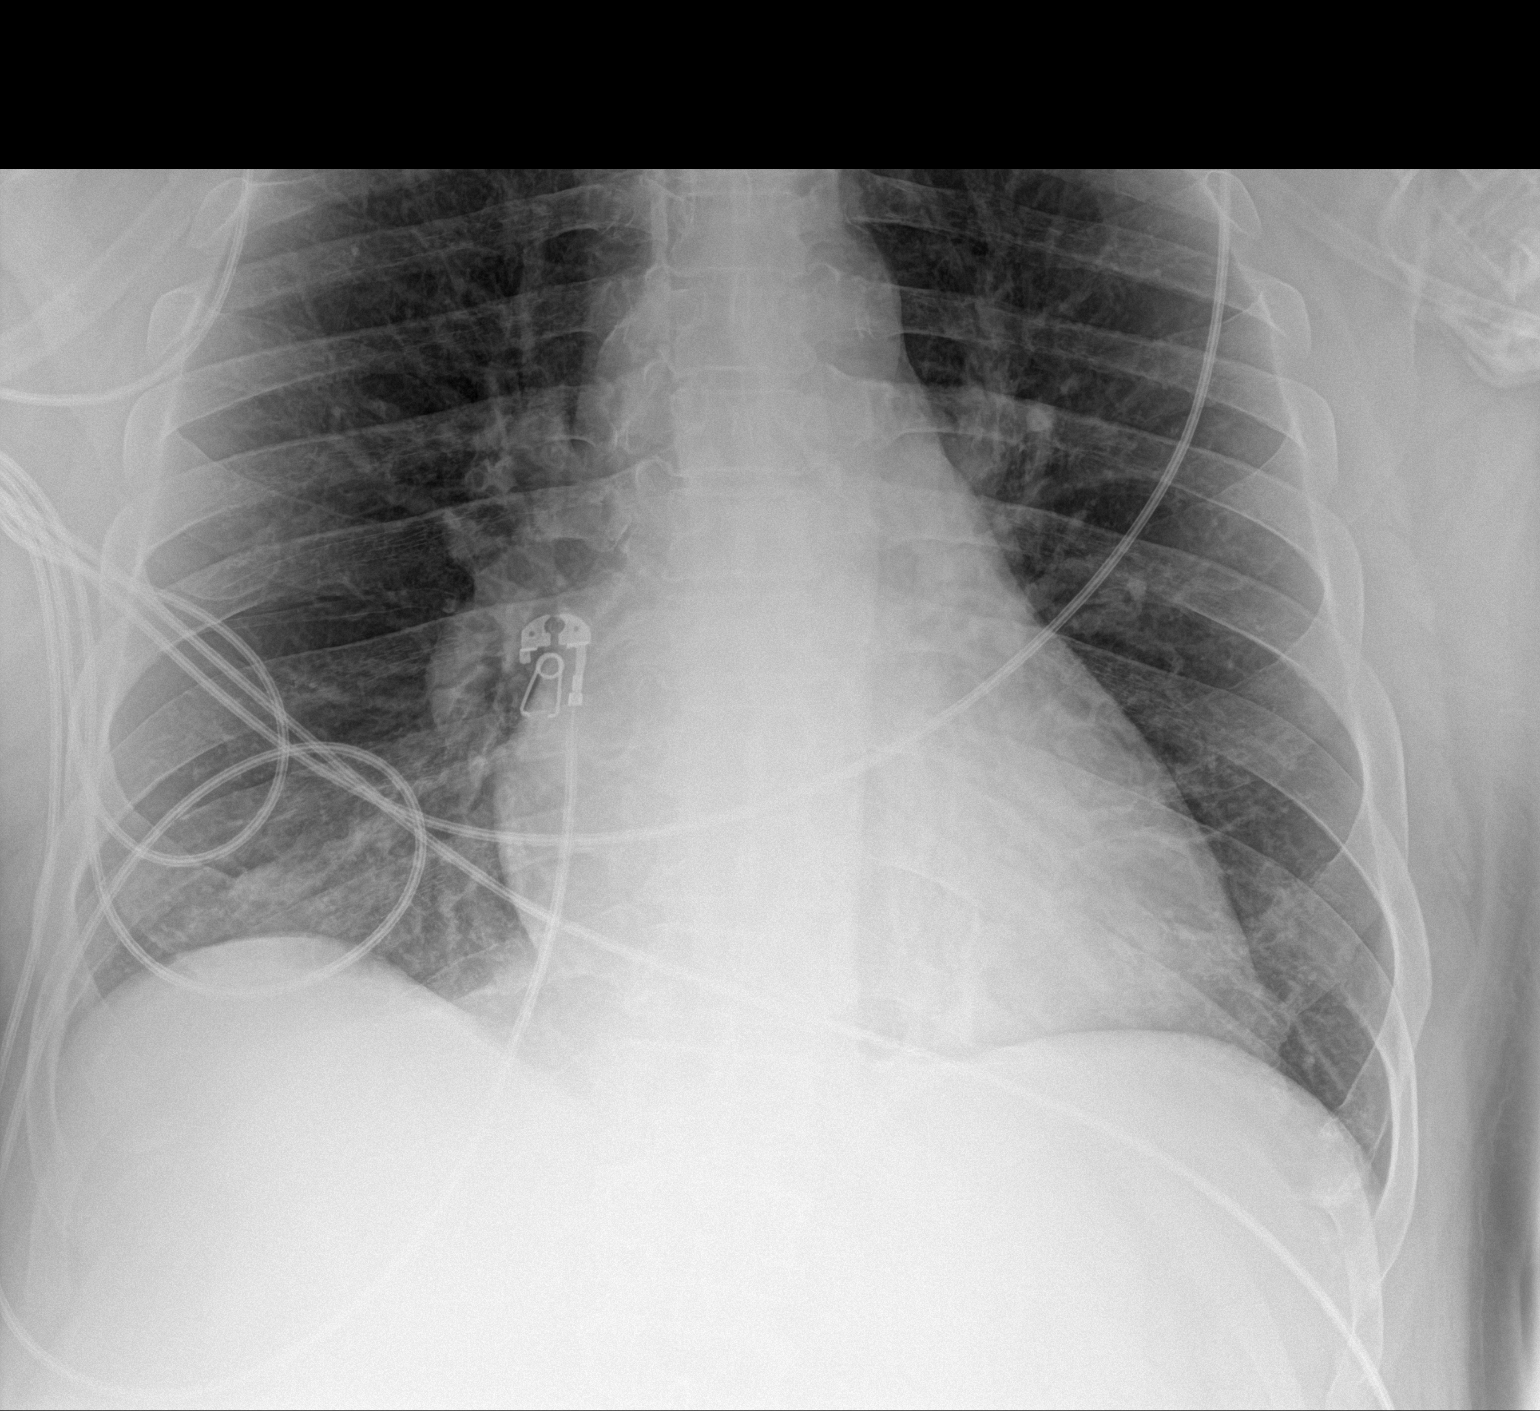

[2 of 2 positions shown; findings below may reference images not displayed]

FINDINGS: Endotracheal tube tip is 5 cm above the carina. There is infiltrate
in both lower lobes, right more than left, consistent with basilar
pneumonia. Upper lungs are clear. No evidence of heart failure or
effusion.
IMPRESSION: Endotracheal tube tip 5 cm above the carina.

Bilateral lower lobe infiltrate right more than left.

## 2018-09-29 IMAGING — DX DG ABD PORTABLE 1V
1 series · 1 of 1 positions shown · non-contrast
Comparison: 10/27/2017

CLINICAL DATA: Orogastric placement.

EXAM:
PORTABLE ABDOMEN - 1 VIEW

[abdomen kub]
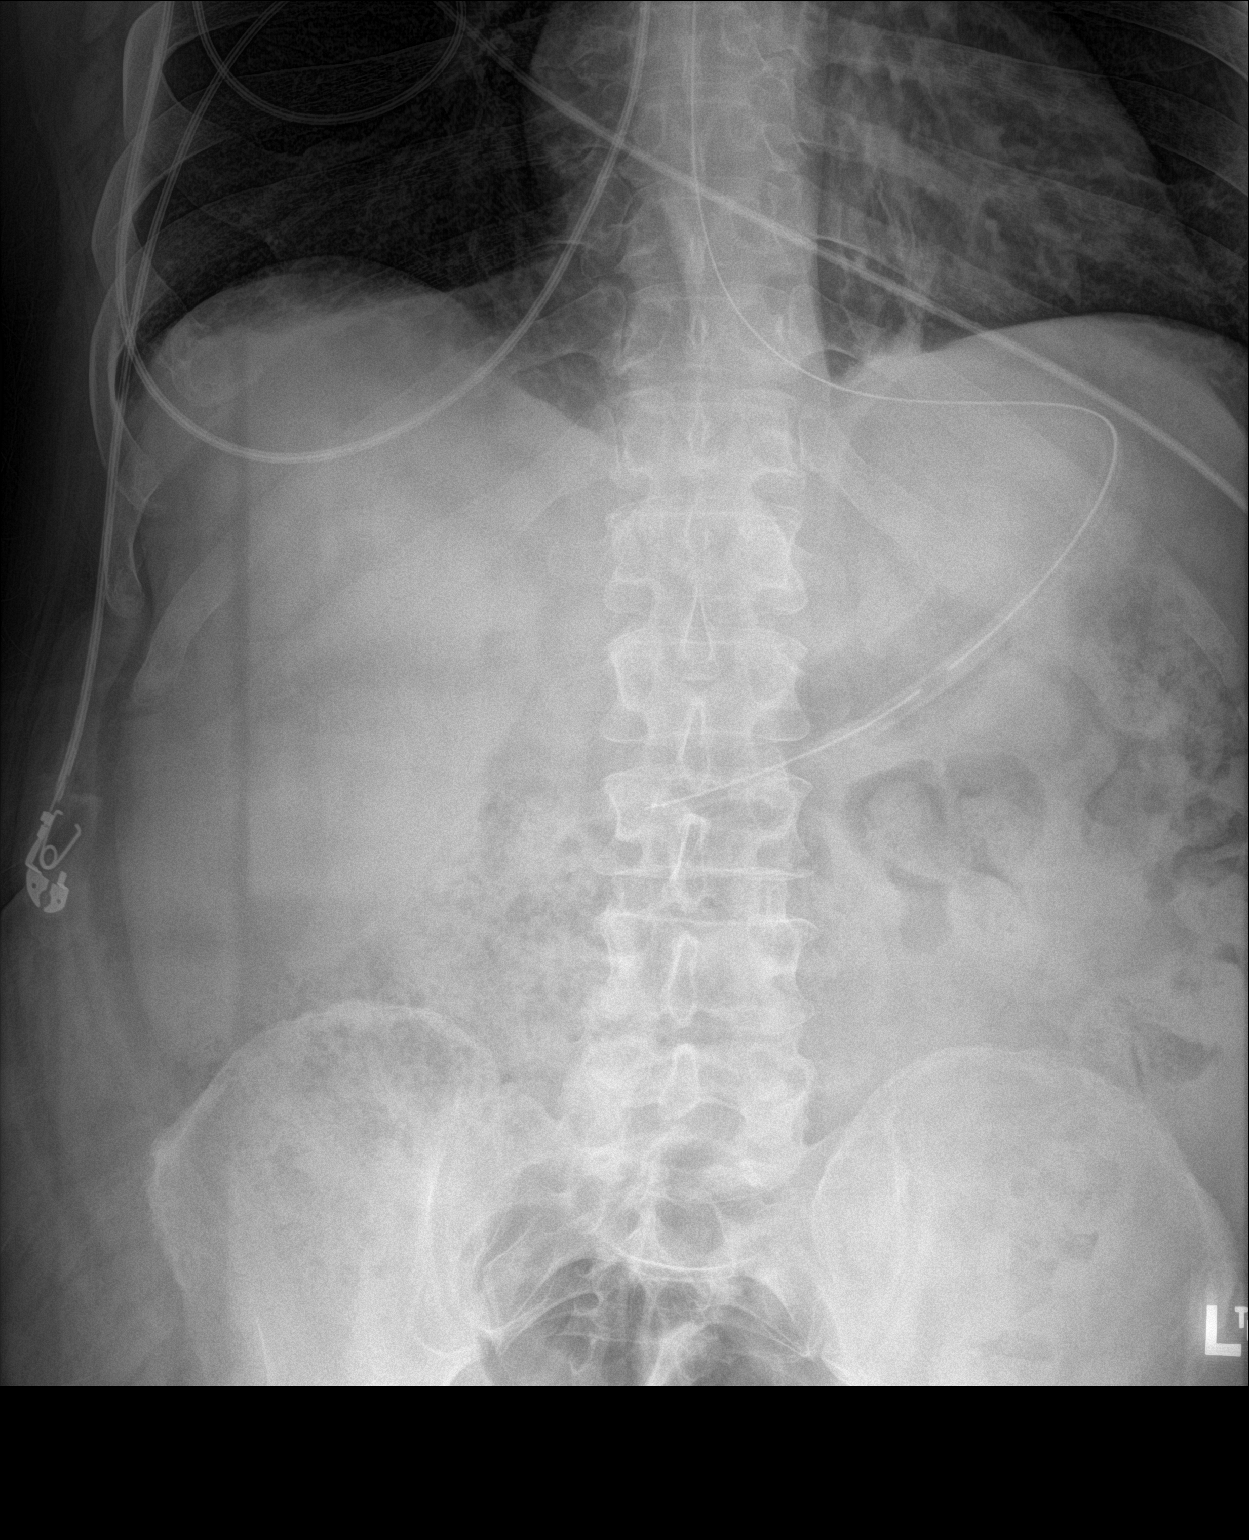

[1 of 1 positions shown; findings below may reference images not displayed]

FINDINGS: Orogastric tube enters the stomach in has its tip in the antral
region. Abdominal gas pattern is unremarkable.
IMPRESSION: Orogastric tube tip in the gastric antrum region.

## 2018-09-30 LAB — CMP14+EGFR
ALT: 11 IU/L (ref 0–44)
AST: 14 IU/L (ref 0–40)
Albumin/Globulin Ratio: 1.1 — ABNORMAL LOW (ref 1.2–2.2)
Albumin: 4.1 g/dL (ref 3.8–4.9)
Alkaline Phosphatase: 83 IU/L (ref 39–117)
BILIRUBIN TOTAL: 0.3 mg/dL (ref 0.0–1.2)
BUN/Creatinine Ratio: 10 (ref 9–20)
BUN: 16 mg/dL (ref 6–24)
CO2: 20 mmol/L (ref 20–29)
Calcium: 9.3 mg/dL (ref 8.7–10.2)
Chloride: 94 mmol/L — ABNORMAL LOW (ref 96–106)
Creatinine, Ser: 1.56 mg/dL — ABNORMAL HIGH (ref 0.76–1.27)
GFR calc Af Amer: 58 mL/min/{1.73_m2} — ABNORMAL LOW (ref >59)
GFR calc non Af Amer: 50 mL/min/{1.73_m2} — ABNORMAL LOW (ref >59)
Globulin, Total: 3.7 g/dL (ref 1.5–4.5)
Glucose: 358 mg/dL — ABNORMAL HIGH (ref 65–99)
Potassium: 4.2 mmol/L (ref 3.5–5.2)
Sodium: 131 mmol/L — ABNORMAL LOW (ref 134–144)
Total Protein: 7.8 g/dL (ref 6.0–8.5)

## 2018-09-30 IMAGING — DX DG CHEST 1V PORT
1 series · 1 of 1 positions shown · non-contrast
Comparison: 12/16/2017

CLINICAL DATA: Check endotracheal tube placement

EXAM:
PORTABLE CHEST 1 VIEW

[chest ap]
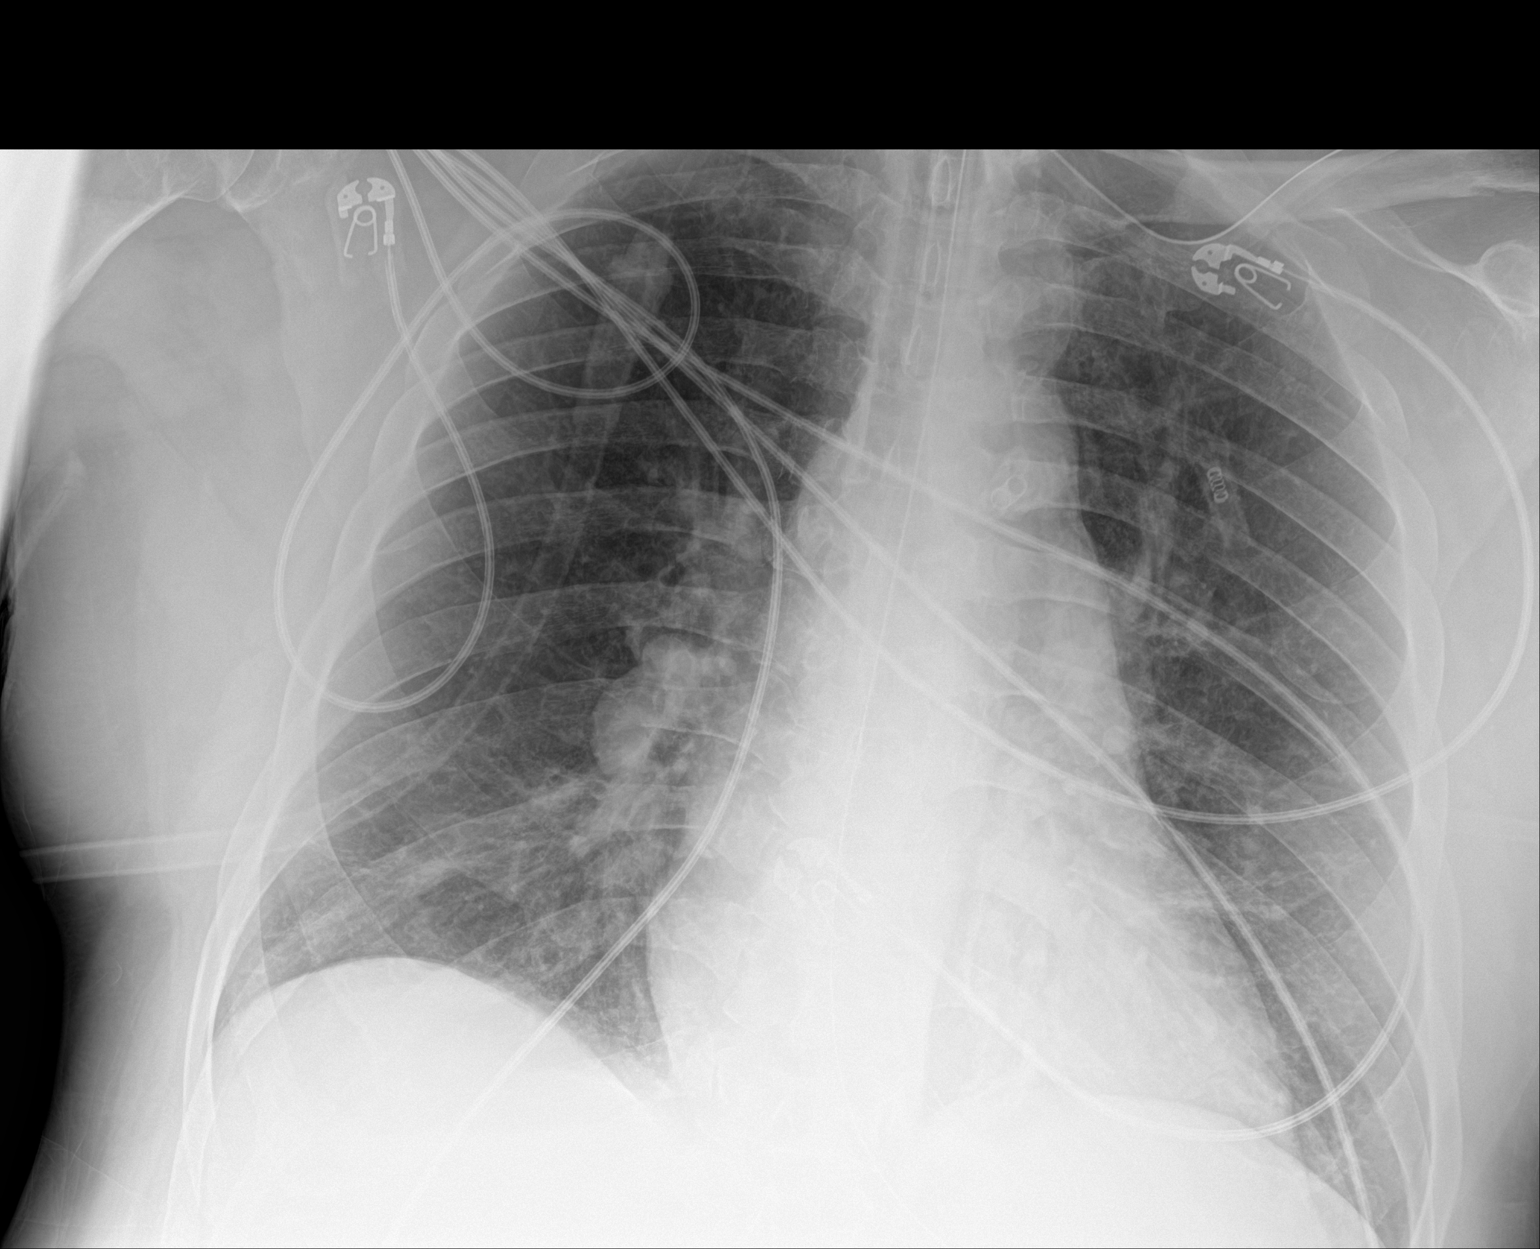

[1 of 1 positions shown; findings below may reference images not displayed]

FINDINGS: Endotracheal tube and nasogastric catheter are again noted and
stable. The lungs are well aerated bilaterally.. The previously seen
basilar changes have improved in the interval. Cardiac shadow is
stable. No bony abnormality is noted.
IMPRESSION: Improving aeration in the bases bilaterally.

## 2018-10-01 IMAGING — DX DG CHEST 1V PORT
1 series · 1 of 1 positions shown · non-contrast
Comparison: 12/17/2017

CLINICAL DATA: Endotracheally intubated.

EXAM:
PORTABLE CHEST 1 VIEW

[chest ap]
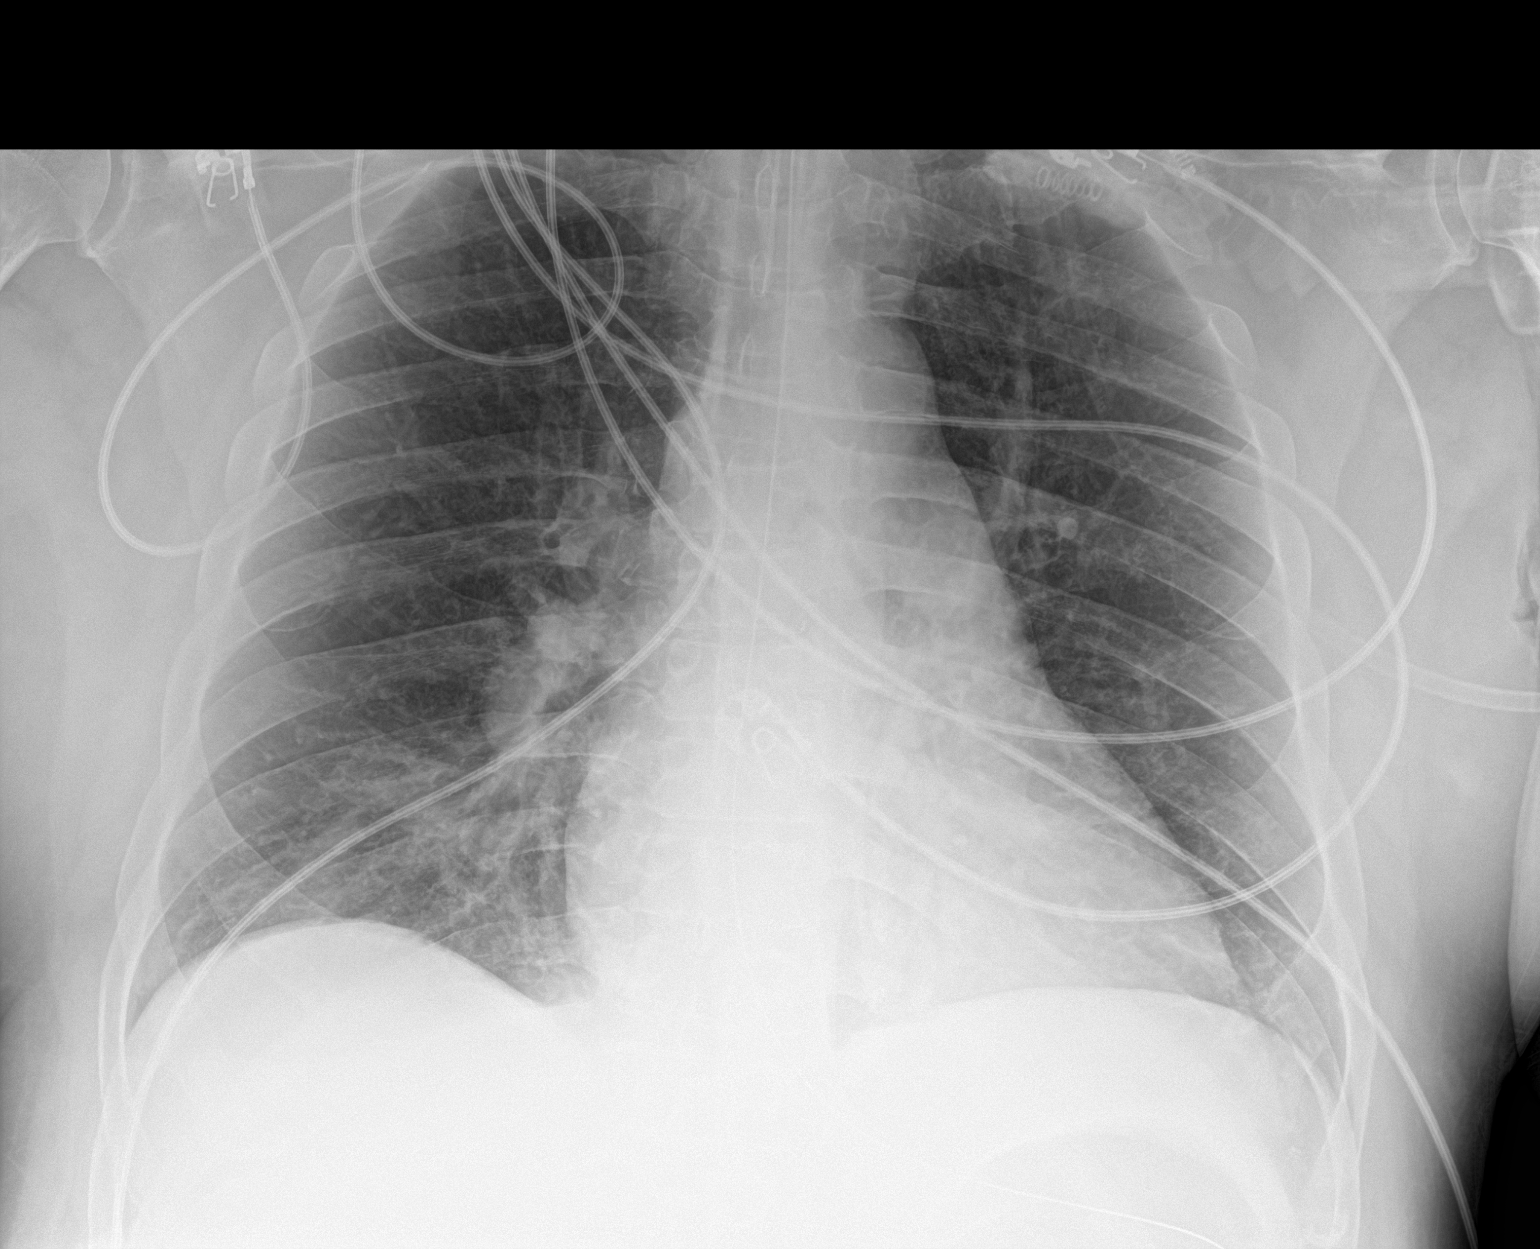

[1 of 1 positions shown; findings below may reference images not displayed]

FINDINGS: Endotracheal tube terminates midway between the clavicles and
carina, unchanged. Enteric tube courses into the left upper abdomen
with tip not imaged. The cardiomediastinal silhouette is unchanged.
Mild bibasilar opacities has not significantly changed and are at
least partially chronic based on radiographs from prior years. No
overt edema, sizable pleural effusion, or pneumothorax is
identified.
IMPRESSION: 1. Unchanged and satisfactory position of endotracheal tube.
2. Unchanged mild bibasilar opacities which may reflect atelectasis
and mild chronic lung changes.

## 2018-10-03 IMAGING — DX DG CHEST 1V PORT
1 series · 1 of 1 positions shown · non-contrast
Comparison: 12/18/2017

CLINICAL DATA: Endotracheal placement.

EXAM:
PORTABLE CHEST 1 VIEW

[chest]
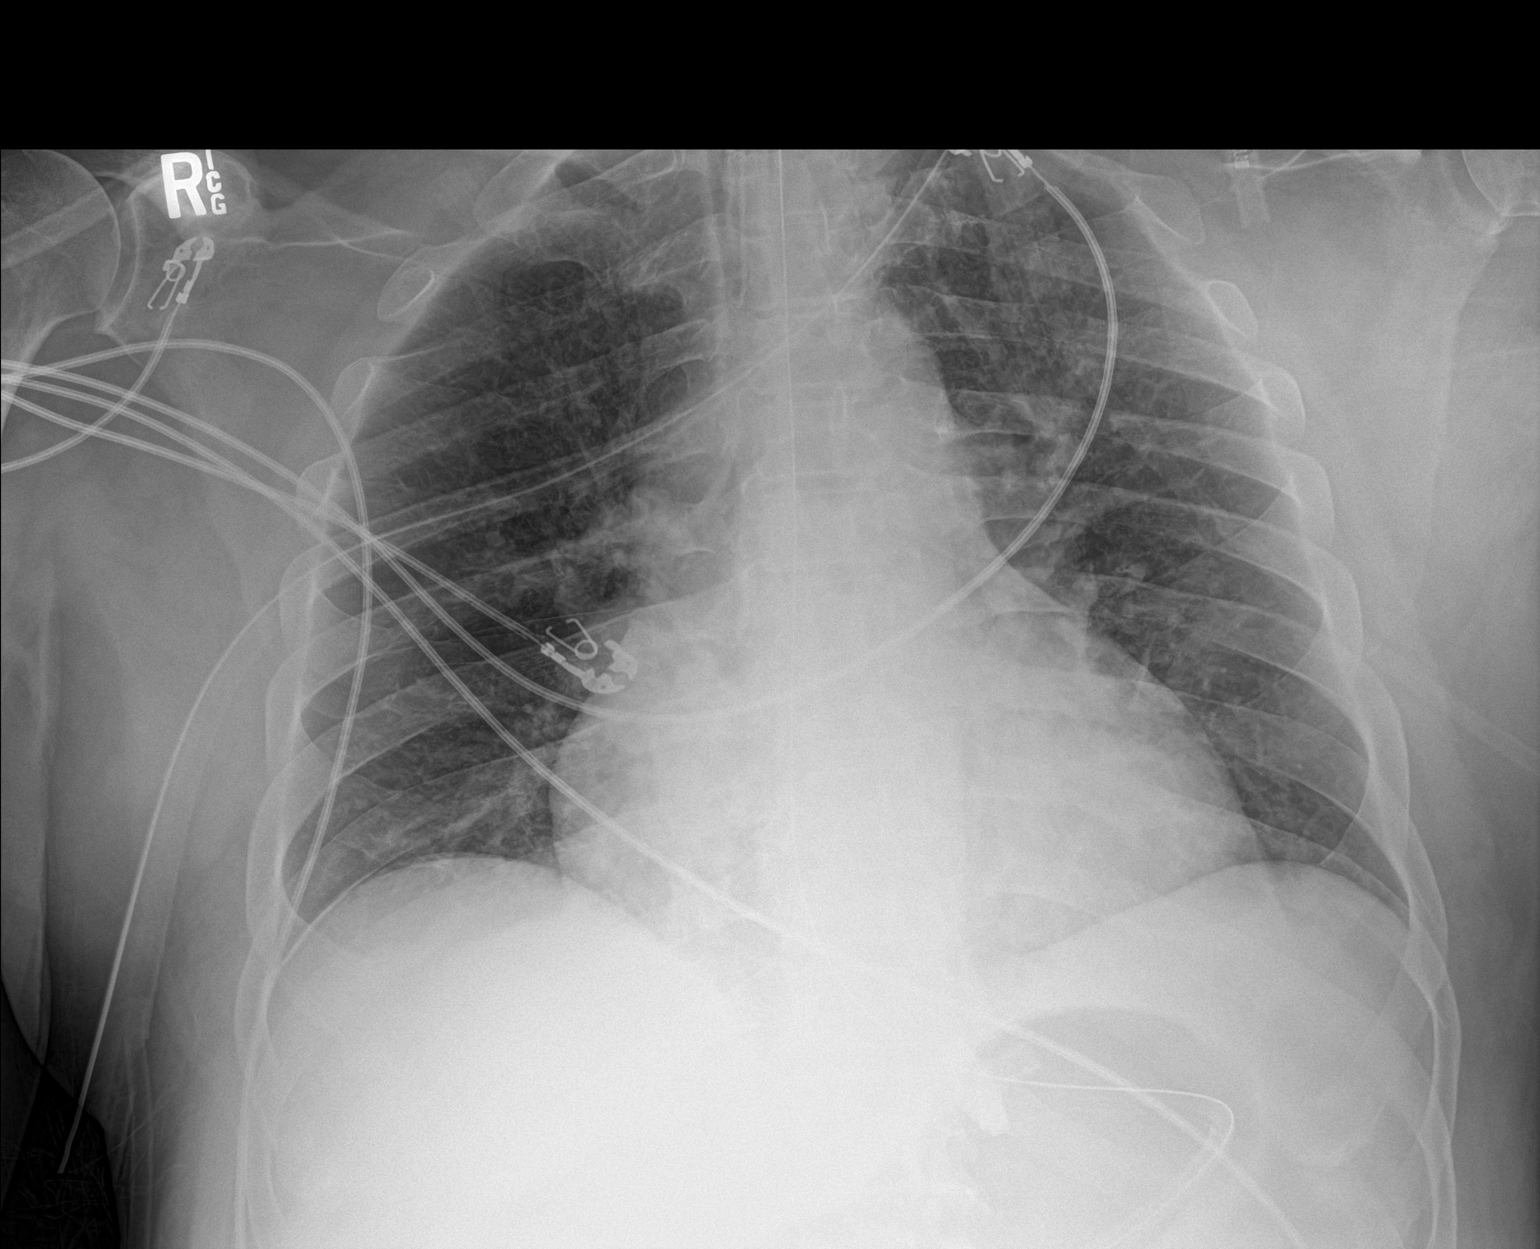

[1 of 1 positions shown; findings below may reference images not displayed]

FINDINGS: Endotracheal tube tip is 4 cm above the carina. Nasogastric tube
enters the stomach. Mild cardiomegaly as seen previously. The lungs
are clear and well aerated. No edema.
IMPRESSION: Endotracheal tube and nasogastric tube well positioned. Lungs clear.

## 2018-10-04 ENCOUNTER — Encounter: Payer: Self-pay | Admitting: Internal Medicine

## 2018-10-04 ENCOUNTER — Ambulatory Visit (INDEPENDENT_AMBULATORY_CARE_PROVIDER_SITE_OTHER): Payer: Medicare HMO | Admitting: Internal Medicine

## 2018-10-04 DIAGNOSIS — J9612 Chronic respiratory failure with hypercapnia: Secondary | ICD-10-CM

## 2018-10-04 DIAGNOSIS — F17210 Nicotine dependence, cigarettes, uncomplicated: Secondary | ICD-10-CM

## 2018-10-04 DIAGNOSIS — J9611 Chronic respiratory failure with hypoxia: Secondary | ICD-10-CM | POA: Diagnosis not present

## 2018-10-04 DIAGNOSIS — J449 Chronic obstructive pulmonary disease, unspecified: Secondary | ICD-10-CM

## 2018-10-04 DIAGNOSIS — R69 Illness, unspecified: Secondary | ICD-10-CM | POA: Diagnosis not present

## 2018-10-04 IMAGING — DX DG CHEST 1V PORT
1 series · 1 of 1 positions shown · non-contrast
Comparison: 12/20/2017

CLINICAL DATA: Shortness of breath

EXAM:
PORTABLE CHEST 1 VIEW

[chest ap]
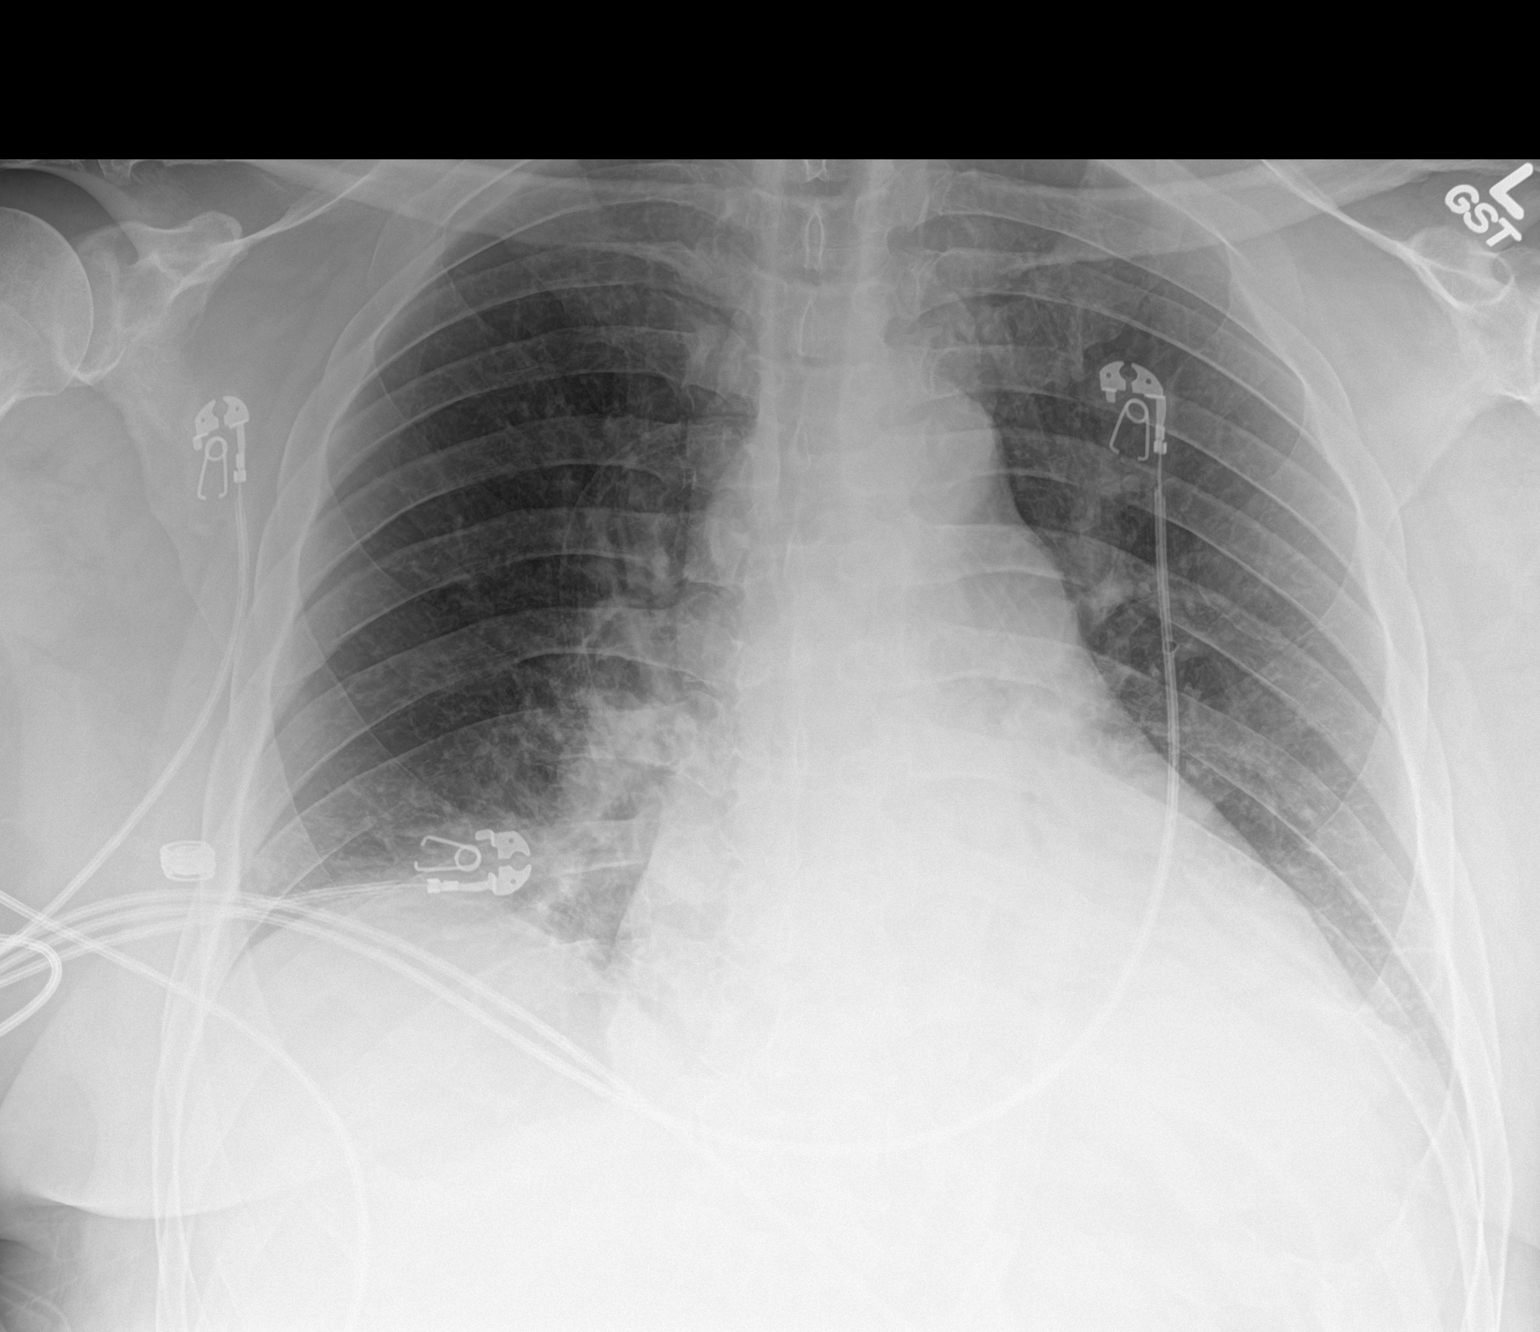

[1 of 1 positions shown; findings below may reference images not displayed]

FINDINGS: Cardiac shadow remains enlarged. Endotracheal tube and nasogastric
catheter have been removed in the interval. Mild right basilar
atelectasis is noted. No pneumothorax is seen. No bony abnormality
is noted.
IMPRESSION: Mild right basilar atelectasis.

## 2018-10-04 MED ORDER — AZITHROMYCIN 250 MG PO TABS: ORAL_TABLET | ORAL | 0 refills | Status: DC

## 2018-10-04 MED ORDER — GLYCOPYRROLATE-FORMOTEROL 9-4.8 MCG/ACT IN AERO: 2.0000 | INHALATION_SPRAY | Freq: Two times a day (BID) | RESPIRATORY_TRACT | 0 refills | Status: DC

## 2018-10-04 MED ORDER — PREDNISONE 10 MG PO TABS: ORAL_TABLET | ORAL | 0 refills | Status: DC

## 2018-10-04 NOTE — Progress Notes (Signed)
Subjective:    Patient ID: Chris [name], male    DOB: 09/23/1965   MRN: [id number]    Brief patient profile:  52 yobm active smoker with HIV  and doe x 2009 much worse since March 2014 so referred by [name] to pulmonary clinic 02/15/2013 with GOLD III copd by pfts 04/28/13.     History of Present Illness  02/15/2013 1st pulmonary eval  Cc indolent onset progressive doe x walking slow pace more than 100 ft,  not at rest., 02 dep, doe much worse x 3 months assoc with congested cough esp in am with sev tbsp thick white mucus takes about 30 min to clear.  No better on inhalers tried to date including advair and spiriva - albuterol works the best and using lots of saba and nebs rec Stop advair and spiriva Plan A = Automatic = Start symbicort 160 Take 2 puffs first thing in am and then another 2 puffs about 12 hours later.  Plan B = backup= Only use your albuterol (as a rescue medication to be used if you can't catch your breath by resting or doing a relaxed purse lip breathing pattern. The less you use it, the better it will work when you need it. Ok to use up to 2 puffs every 4 hours Plan C = nebulizer, use this only if plan B doesn't work ok to use up to 4 hours       05/26/2017  Acute  ov/Chris [name] re:   COPD III/ symb/spiriva rare saba / 02 2pm hs then during day prn  Chief Complaint  Patient presents with  . Acute Visit    Increased DOE x 3 days.   most days gets up 6 am with sob/some congestion > symb x 2 and then spiriva and saba and maybe once a week with saba hfa / did not bring it with him  Then worse gradually x 3 days, waking up 4am not using rescue then  Not much cough-   does not bother sleep until 4 am but even then minimal vol of mucus/ white  Needing 02 2lpm 24/7 now  On PPI bid ac  rec Plan A = Automatic =  symbicort 160 Take 2 puffs first thing in am and then another 2 puffs about 12 hours later.                                       spiriva 2 puffs  Work on inhaler  technique:   Plan B = Backup Only use your albuterol as a rescue medication Plan C = Crisis - only use your albuterol nebulizer if you first try Plan B The key is to stop smoking completely before smoking completely stops you!  Prednisone 10 mg take  4 each am x 2 days,   2 each am x 2 days,  1 each am x 2 days and stop  Please schedule a follow up visit in 3 months but call sooner if needed    Admit date: 07/21/2017 Discharge date: 07/28/2017  Time spent: 35 minutes  Recommendations for Outpatient Follow-up:  1. Repeat basic metabolic panel to follow electrolytes and renal function 2. Patient needs outpatient sleep study to sit up and determine candidacy for CPAP. 3. Reassess volume status and blood pressure, adjust medications as needed. 4. Close follow-up to patient's CBG and A1c; further adjust hypoglycemic regimen as indicated.    Discharge Diagnoses:  Active Problems:   HIV disease ([place])   COPD  GOLD III, still smoking   Steroid-induced hyperglycemia   Chronic respiratory failure with hypoxia and hypercapnia (H[facility])   DM (diabetes mellitus) (H[facility])   COPD exacerbation (HCC)   Obstructive sleep apnea syndrome   Essential hypertension, benign   Atopic eczema   Type 2 diabetes mellitus with complication (HCC)   Morbid (severe) obesity due to excess calories ([place])   COPD with acute exacerbation (HCC)   Depression   GERD (gastroesophageal reflux disease)   Acute on chronic respiratory failure with hypoxia ([place])    08/05/2017  Post hosp  f/u ov/Chris [name] re:  p copd exac - did not follow action plan reviewed at last ov prior to ER Chief Complaint  Patient presents with  . Follow-up    hospital f/u- [facility], D/C last week with COPD, using 3L [place], still has some SOB  with 02    -  MMRC2 = can't walk a nl pace on a flat grade s sob but does fine slow and flat  Using 02 as needed daytime and 2lpm hs  rec Work on inhaler technique:   Plan A = Automatic =  symbicort 160 Take 2 puffs  first thing in am and then another 2 puffs about 12 hours later. spiriva 2 puffs in am only  Plan B = Backup Only use your albuterol as a rescue medication  Plan C = Crisis - only use your albuterol nebulizer if you first try Plan B and it fails to help > ok to use the nebulizer up to every 4 hours but if start needing it regularly call for immediate appointment The key is to stop smoking completely before smoking completely stops you!  Keep your follow up appt but bring all inhalers and nebulizer solutions with you to this visit    09/22/2017  f/u ov/Chris [name] re: still smoking/ did not bring meds as rec/ saba hfa maybe twice  A week  On maint symb 160/spiriva  Chief Complaint  Patient presents with  . Follow-up    Pt c/o chest tightness on the left off and on for the past 3 wks.   Dyspnea:  Better than usual able to do [place] slow pace = MMRC2 = can't walk a nl pace on a flat grade s sob but does fine slow and flat  Cough: minimal/ mostly am mucoid Sleep: on 2lpm on side and 2 pillows ok  rec The key is to stop smoking completely before smoking completely stops you!  Ok to try symb 80 Take 2 puffs first thing in am and then another 2 puffs about 12 hours later to see if breathing is as good For thrush> clotrimazole troche 10 mg up to 4 x daily as needed       04/29/2018  f/u ov/Chris [name] re: GOLD III/ 02 dep hs and ex/ still smoking / still on augmentin /clind for ";pna" (no xrays avail) Chief Complaint  Patient presents with  . Follow-up    Had PNA recently.  He states he did not have his albuterol so he has been using his symbicort up to 4 x daily with no relief. He is not coughing much, but feels SOB.    Dyspnea: X 50 ft  Cough: resolved Sleeping: flat/ one pillow SABA use: ran out 02: 2lpmhs/ 3lpm activity  rec No change rx     05/17/2018  f/u ov/Chris [name] re:  GOLD III/ 02 dep / still smoking  Chief Complaint  Patient presents with  . Follow-up    Breathing is overall doing well. He  is using his albuterol inhaler once daily on average.   Dyspnea:  MMRC3 = can't walk 100 yards even at a slow pace at a flat grade s stopping due to sob even on 3lpm  Cough: min productions Sleeping: flat / on side with one pillow and  SABA maybe once a day when over does it  02: use:2lpm 24/7 except sometimes turns up with walking to 3lpm   rec No change medications for now - at next refills stop the symbicort and incruse and change to BEVESPI Take 2 puffs first thing in am and then another 2 puffs about 12 hours later to see if helps breathing and voice      10/04/2018  f/u ov/Chris [name] re: copd III/ on 02 prn and bevespi 2 bid  Chief Complaint  Patient presents with  . Follow-up    Pt c/o increased cough and fatigue x 3 wks. His cough is prod with yellow sputum. He is using his albuterol inhaler about once per wk.  Dyspnea:  Can do save a lot/ does not use hc parking  Cough: worse x 3 weeks esp in am with slt yellow but min mucus production  Sleeping: on side / bed is flat two pillows  SABA use: rarely 02: 2lpm prn    No obvious day to day or daytime variability or assoc excess/ purulent sputum or mucus plugs or hemoptysis or cp or chest tightness, subjective wheeze or overt sinus or hb symptoms.   Sleeps ok as above  without nocturnal  or early am exacerbation  of respiratory  c/o's or need for noct saba. Also denies any obvious fluctuation of symptoms with weather or environmental changes or other aggravating or alleviating factors except as outlined above   No unusual exposure hx or h/o childhood pna/ asthma or knowledge of premature birth.  Current Allergies, Complete Past Medical History, Past Surgical History, Family History, and Social History were reviewed in [employer] record.  ROS  The following are not active complaints unless bolded Hoarseness, sore throat, dysphagia, dental problems, itching, sneezing,  nasal congestion or discharge of excess mucus or  purulent secretions, ear ache,   fever, chills, sweats, unintended wt loss or wt gain, classically pleuritic or exertional cp,  orthopnea pnd or arm/hand swelling  or leg swelling, presyncope, palpitations, abdominal pain, anorexia, nausea, vomiting, diarrhea  or change in bowel habits or change in bladder habits, change in stools or change in urine, dysuria, hematuria,  rash, arthralgias, visual complaints, headache, numbness, weakness or ataxia or problems with walking or coordination,  change in mood or  memory.        Current Meds  Medication Sig  . albuterol (PROVENTIL HFA;VENTOLIN HFA) 108 (90 Base) MCG/ACT inhaler Inhale 2 puffs into the lungs every 6 (six) hours as needed for wheezing or shortness of breath.  [name] BIKTARVY 50-200-25 MG TABS tablet TAKE 1 TABLET BY MOUTH EVERY DAY  . Blood Glucose Monitoring Suppl (ONETOUCH VERIO) w/Device KIT 1 kit by Does not apply route 2 (two) times daily.  [name] diltiazem (CARDIZEM CD) 240 MG 24 hr capsule Take 1 capsule (240 mg total) by mouth daily.  . DULoxetine (CYMBALTA) 30 MG capsule Take 1 capsule (30 mg total) by mouth daily for 30 days.  [name] glucose blood (ONETOUCH VERIO) test strip Use as instructed  . Glycopyrrolate-Formoterol (BEVESPI AEROSPHERE) 9-4.8 MCG/ACT AERO  Inhale into the lungs.  . imiquimod (ALDARA) 5 % cream Apply 3 times per week prior to bedtime; leave on for 6-10 hours, then remove with soap and water. Continue until total clearance of warts.  . metFORMIN (GLUCOPHAGE) 1000 MG tablet Take 1 tablet (1,000 mg total) by mouth daily with breakfast. (Patient taking differently: Take 1,000 mg by mouth 2 (two) times daily with a meal. )  . Misc. Devices MISC Please provide patient with a portable oximeter. Diagnosis: COPD GOLD III  . nystatin (MYCOSTATIN/NYSTOP) powder Apply topically 3 (three) times daily.  Glory Rosebush DELICA LANCETS 28U MISC Use as instructed  . OXYGEN Inhale 2 L into the lungs as needed (shortness of breath).  . sildenafil  (VIAGRA) 25 MG tablet Take 1 tablet (25 mg total) by mouth daily as needed for erectile dysfunction.                          Objective:   Physical Exam   Obese bm nad    Vital signs reviewed - Note on arrival 02 sats  96% on RA     10/04/2018     247  01/05/2015       278 >  02/29/2016  274 > 06/17/2016 277 > 09/25/2016   258  > 12/11/2016   270  > 05/26/2017  270 > 08/05/2017  255  > 09/22/2017   262 > 03/22/2018   244 > 04/29/2018  258 > 05/17/2018  253  01/28/2016        272   05/16/14 285 lb 6.4 oz (129.457 kg)  04/20/14 282 lb 12.8 oz (128.277 kg)  03/14/14 274 lb 3.2 oz (124.376 kg)       HEENT: nl dentition, turbinates bilaterally, and oropharynx. Nl external ear canals without cough reflex   NECK :  without JVD/Nodes/TM/ nl carotid upstrokes bilaterally   LUNGS: no acc muscle use,  Nl contour chest with insp/exp rhonchi  bilaterally without cough on insp or exp maneuvers   CV:  RRR  no s3 or murmur or increase in P2, and no edema   ABD: quite obese soft and nontender with nl inspiratory excursion in the supine position. No bruits or organomegaly appreciated, bowel sounds nl  MS:  Nl gait/ ext warm without deformities, calf tenderness, cyanosis or clubbing No obvious joint restrictions   SKIN: warm and dry without lesions    NEURO:  alert, approp, nl sensorium with  no motor or cerebellar deficits apparent.                     Assessment & Plan:

## 2018-10-04 NOTE — Patient Instructions (Addendum)
zpak   Prednisone 10 mg take  4 each am x 2 days,   2 each am x 2 days,  1 each am x 2 days and stop   No change in medications   Please schedule a follow up visit in 6 months but call sooner if needed

## 2018-10-05 DIAGNOSIS — Z8679 Personal history of other diseases of the circulatory system: Secondary | ICD-10-CM | POA: Diagnosis not present

## 2018-10-05 DIAGNOSIS — H353132 Nonexudative age-related macular degeneration, bilateral, intermediate dry stage: Secondary | ICD-10-CM | POA: Diagnosis not present

## 2018-10-05 DIAGNOSIS — I480 Paroxysmal atrial fibrillation: Secondary | ICD-10-CM | POA: Diagnosis not present

## 2018-10-06 ENCOUNTER — Telehealth: Payer: Self-pay

## 2018-10-06 NOTE — Telephone Encounter (Signed)
-----   Message from Claiborne Rigg, NP sent at 10/01/2018  8:51 PM EST ----- Glucose has improved as well as sodium.  Please make sure you are watching the amount of white foods that you eat which means breads, sweets, rice, potatoes, flour or anything fried in flour, corn and pasta. You should eliminate them all if you haven't already.

## 2018-10-06 NOTE — Telephone Encounter (Signed)
CMA spoke to patient to inform on results.  Pt. Verified DOB. Pt. Understood.  Pt. Request if PCP can prescribe tylenol 3 for pain, per patient he no longer want to go to his pain management for his knee pain.

## 2018-10-07 ENCOUNTER — Other Ambulatory Visit: Payer: Self-pay | Admitting: Internal Medicine

## 2018-10-07 ENCOUNTER — Ambulatory Visit: Payer: Medicare HMO | Admitting: Podiatry

## 2018-10-10 ENCOUNTER — Encounter: Payer: Self-pay | Admitting: Internal Medicine

## 2018-10-10 NOTE — Assessment & Plan Note (Signed)
Active smoker - 04/28/2013 PFT's FEV1 1.21 (39%) ratio 59 and 13% better p B2 and dlco 72 corrects to 102  - started spiriva 04/30/2013 > changed to respimat 02/28/2014 -Med calendar 03/14/2014 > not using as of 05/16/14  - arrived on stiolto / symbicort 02/29/2016 > changed to symbicort 80/spiriva (lower dose ICS due to interaction with aids meds  - PFT's  06/17/2016  FEV1 1.29 (42 % ) ratio 64  p 43 % improvement from saba p symb 80 /spiriva prior to study with DLCO  51/49c % corrects to 86  % for alv volume   - 06/17/2016   change symb to 160 2bid  - 09/25/2016  After extensive coaching device  effectiveness =    90% with dpi/ elipta > changed to Incruse per Insurance restrictions > preferred spiriva - 12/11/2016   changed to spiriva respimat  08/05/2017  After extensive coaching HFA effectiveness =  90%    - 09/22/2017 changed to symb 80 2bid with pseudowheeze and thrush  - PFT's  03/22/2018  FEV1 1.34 (45 % ) ratio 63  p 17 % improvement from saba p sym 160/ incruse prior to study with DLCO  36/41 % corrects to 72  % for alv volume   - trial of bevespi 2bid 05/17/2018 >>> improved as of 10/04/2018  - 10/04/2018  After extensive coaching inhaler device,  effectiveness =    75%    Despite suboptimal hfa and active smoking he has historically done well though may be having a mild aecopd at present so rec   1) no change rx for now - if continues to flare may need to go back to symb/spiriva 2) zpak 3) Prednisone 10 mg take  4 each am x 2 days,   2 each am x 2 days,  1 each am x 2 days and stop   F/u in 6 m, sooner prn flares

## 2018-10-10 NOTE — Telephone Encounter (Signed)
He would need to make an appointment to be seen sooner to discuss pain medicine and will also need xrays of his knee prior to me prescribing.

## 2018-10-10 NOTE — Assessment & Plan Note (Signed)
Counseled re importance of smoking cessation but did not meet time criteria for separate billing   °

## 2018-10-10 NOTE — Assessment & Plan Note (Signed)
-    HCO3 31 01/17/16 - 01/05/2015  Walked RA x 3 laps @ 185 ft each stopped due to  Leg pain and fatibue, no sob , nl pace, no desat  -  12/11/2016 Patient Saturations on Room Air at Rest = 88%----increased 98% 2lpm continuous - HC03  24    03/02/18  - 03/22/2018  Walked RA x 3 laps @ 185 ft each stopped due to  End of study, moderate pace, no   desat  / min sob - 05/17/2018  Walked 2lpm x 3 laps @ 185 ft each stopped due to  End of study, no desat   - HCO3  09/29/18 =  20    Likely hypercabic component resolved, no change in recs for self titration with 02 with goal of > 90% walking    I had an extended discussion with the patient reviewing all relevant studies completed to date and  lasting 15 to 20 minutes of a 25 minute visit    See device teaching which extended face to face time for this visit.  Each maintenance medication was reviewed in detail including emphasizing most importantly the difference between maintenance and prns and under what circumstances the prns are to be triggered using an action plan format that is not reflected in the computer generated alphabetically organized AVS which I have not found useful in most complex patients, especially with respiratory illnesses  Please see AVS for specific instructions unique to this visit that I personally wrote and verbalized to the the pt in detail and then reviewed with pt  by my nurse highlighting any  changes in therapy recommended at today's visit to their plan of care.

## 2018-10-12 ENCOUNTER — Telehealth: Payer: Self-pay | Admitting: Cardiology

## 2018-10-12 ENCOUNTER — Telehealth: Payer: Self-pay | Admitting: *Deleted

## 2018-10-12 DIAGNOSIS — Z8679 Personal history of other diseases of the circulatory system: Secondary | ICD-10-CM

## 2018-10-12 MED ORDER — APIXABAN 5 MG PO TABS: 5.0000 mg | ORAL_TABLET | Freq: Two times a day (BID) | ORAL | 3 refills | Status: DC

## 2018-10-12 NOTE — Telephone Encounter (Signed)
   Primary Cardiologist: Chris Lemma, MD  Chart reviewed as part of pre-operative protocol coverage. Patient was contacted 10/12/2018 in reference to pre-operative risk assessment for pending surgery as outlined below.  Chris Favors Luecke Jr. was last seen on 07/22/2018 by Dr. Herbie Baltimore.  Since that day, Chris [name]. has done very well.  He states that he is not having any chest pain, shortness of breath, dizziness or syncope. He says he is " doing great".   He says that he is no longer taking Eliquis because his prescription ran out so he thought he was done. According to our pharmacy protocol he can hold eliquis for 2 days prior to procedure. I told him that I will send in another prescription for Eliquis and he should continue it until discontinued by Dr. Herbie Baltimore. Since his procedure is in 3 days, I told him that he can just start it after the procedure.   Therefore, based on ACC/AHA guidelines, the patient would be at acceptable risk for the planned procedure without further cardiovascular testing.   I will route this recommendation to the requesting party via Epic fax function and remove from pre-op pool.  Please call with questions.  Berton Bon, NP 10/12/2018, 4:58 PM

## 2018-10-12 NOTE — Telephone Encounter (Signed)
Thank you.  I am not sure how he did not realize that he was post continue.  I do not think that the prescription was set to run out either.  Bryan Lemma, MD

## 2018-10-12 NOTE — Telephone Encounter (Signed)
When called for preop clearance pt reported that he has not been taking Eliquis as his prescription ran out so he thought he didn't have to take it any more. I advised him that he is to continue it until stopped by Dr. Herbie Baltimore. I advised him that I will send in a new prescription and he says that he will pick it up.

## 2018-10-12 NOTE — Telephone Encounter (Signed)
Patient stated there are no open appt. And would like to discuss PCP regarding about pain meds on his next OV with PCP.

## 2018-10-12 NOTE — Telephone Encounter (Signed)
Patient with diagnosis of Afib on Eliquis for anticoagulation.    Procedure: rectal surgery Date of procedure: 10/15/18  CHADS2-VASc score of  3 (CHF, HTN, AGE, DM2, stroke/tia x 2, CAD, AGE, male)  CrCl 42ml/min  Per office protocol, patient can hold Eliquis for 2 days prior to procedure.

## 2018-10-12 NOTE — Telephone Encounter (Signed)
   [facility] Pre-operative Risk Assessment    Request for surgical clearance:  1. What type of surgery is being performed? Rectal surgery   2. When is this surgery scheduled? 10/15/2018   3. What type of clearance is required (medical clearance vs. Pharmacy clearance to hold med vs. Both)? BOTH  4. Are there any medications that need to be held prior to surgery and how long?eliquis-they need direction   5. Practice name and name of physician performing surgery? Leighton Ruff MD=[facility]   6. What is your office phone number [phone]    7.   What is your office fax number [phone] kelly dockery  8.   Anesthesia type (None, local, MAC, general) ? MAC   Fredia Beets 10/12/2018, 11:52 AM  _________________________________________________________________   (provider comments below)

## 2018-10-12 NOTE — Telephone Encounter (Signed)
Noted  

## 2018-10-13 ENCOUNTER — Encounter (HOSPITAL_BASED_OUTPATIENT_CLINIC_OR_DEPARTMENT_OTHER): Payer: Self-pay | Admitting: *Deleted

## 2018-10-13 ENCOUNTER — Telehealth: Payer: Self-pay | Admitting: Internal Medicine

## 2018-10-13 ENCOUNTER — Other Ambulatory Visit: Payer: Self-pay

## 2018-10-13 NOTE — Telephone Encounter (Signed)
I don't have this form.

## 2018-10-13 NOTE — Progress Notes (Signed)
Spoke with Chris [name] Npo after midnight, arrive 530 am 10-15-18 [facility] Meds to take sip of water: Albuterol inhaler prn/bring inhaler Cymbalta, hydrazaline, diltiazem Records on chart/epic: cardiac clearcne note janine hammond np 10-12-18, ekg 07-22-18, chest xray 05-17-18, lov dr wert pulmonary 10-04-18, lov dr harding cardiology 07-22-18, echo 10-06-27 Has surgery orders in epic Needs I stat 4 Wife keisher driver

## 2018-10-13 NOTE — Telephone Encounter (Signed)
Per Verlon Au this is in Chris [name] look at folder.   Called and spoke with Tresa Endo advised her that once Chris [name] signs the form we can fax it back. They are needing it by tomorrow for his surgery date that is 10/15/18.   Chris [name] please advise, thank you.

## 2018-10-14 ENCOUNTER — Encounter: Payer: Self-pay | Admitting: *Deleted

## 2018-10-14 ENCOUNTER — Ambulatory Visit (HOSPITAL_BASED_OUTPATIENT_CLINIC_OR_DEPARTMENT_OTHER): Payer: Medicare HMO | Admitting: Anesthesiology

## 2018-10-14 NOTE — Telephone Encounter (Signed)
Letter done and faxed

## 2018-10-14 NOTE — Telephone Encounter (Signed)
Cleared for MAC anesthesia for rectal surgery though there is a risk he might need bipap post op until mobilized and analgesic use has been minimzed

## 2018-10-14 NOTE — Telephone Encounter (Signed)
Called and spoke with Toniann Fail, she will have this faxed over again and attention to [place]. Will route to [place] to follow up on since surgery is tomorrow.

## 2018-10-14 NOTE — Anesthesia Preprocedure Evaluation (Addendum)
Anesthesia Evaluation    Airway        Dental   Pulmonary sleep apnea , COPD,  oxygen dependent, Current Smoker,           Cardiovascular hypertension, +CHF (diastolic)  + dysrhythmias Atrial Fibrillation      Neuro/Psych PSYCHIATRIC DISORDERS Anxiety Depression    GI/Hepatic GERD  ,  Endo/Other  diabetes, Poorly Controlled  Renal/GU Renal InsufficiencyRenal disease     Musculoskeletal   Abdominal   Peds  Hematology  (+) HIV,   Anesthesia Other Findings 53 yo M for laser ablation of condyloma - COPD on home O2, current smoker, OSA, anx/dep, HTN, CHF, A fib, GERD, CKD, DM, HIV - Chris [name], pulmonology: ?oCleared for MAC anesthesia for rectal surgery though there is a risk he might need bipap post op until mobilized and analgesic use has been minimzed? Berton Bon, Cardiology: "based on ACC/AHA guidelines, the patient would be at acceptable risk for the planned procedure without further cardiovascular testing." - TTE 10/05/17: EF 60-65%, mild MR  Reproductive/Obstetrics                             Anesthesia Physical Anesthesia Plan  ASA: IV  Anesthesia Plan: MAC   Post-op Pain Management:    Induction:   PONV Risk Score and Plan: 0 and Propofol infusion and Treatment may vary due to age or medical condition  Airway Management Planned: Nasal Cannula and Simple Face Mask  Additional Equipment: None  Intra-op Plan:   Post-operative Plan:   Informed Consent:   Plan Discussed with:   Anesthesia Plan Comments: (*CASE CANCELLED DUE TO EXTREMELY HIGH BLOOD GLUCOSE (483) WITH ASSOCIATED HYPONATREMIA (130))       Anesthesia Quick Evaluation

## 2018-10-15 ENCOUNTER — Encounter (HOSPITAL_BASED_OUTPATIENT_CLINIC_OR_DEPARTMENT_OTHER)
Admission: RE | Disposition: A | Discharge status: Home / Self Care | Payer: Self-pay | Source: Home / Self Care | Attending: General Surgery

## 2018-10-15 ENCOUNTER — Ambulatory Visit (HOSPITAL_BASED_OUTPATIENT_CLINIC_OR_DEPARTMENT_OTHER)
Admission: RE | Admit: 2018-10-15 | Discharge: 2018-10-15 | Disposition: A | Discharge status: Home / Self Care | Payer: Medicare HMO | Attending: General Surgery | Admitting: General Surgery

## 2018-10-15 ENCOUNTER — Other Ambulatory Visit: Payer: Self-pay

## 2018-10-15 ENCOUNTER — Encounter (HOSPITAL_BASED_OUTPATIENT_CLINIC_OR_DEPARTMENT_OTHER): Payer: Self-pay | Admitting: *Deleted

## 2018-10-15 DIAGNOSIS — Z21 Asymptomatic human immunodeficiency virus [HIV] infection status: Secondary | ICD-10-CM | POA: Diagnosis not present

## 2018-10-15 DIAGNOSIS — F17200 Nicotine dependence, unspecified, uncomplicated: Secondary | ICD-10-CM | POA: Insufficient documentation

## 2018-10-15 DIAGNOSIS — A630 Anogenital (venereal) warts: Secondary | ICD-10-CM | POA: Diagnosis not present

## 2018-10-15 DIAGNOSIS — I130 Hypertensive heart and chronic kidney disease with heart failure and stage 1 through stage 4 chronic kidney disease, or unspecified chronic kidney disease: Secondary | ICD-10-CM | POA: Insufficient documentation

## 2018-10-15 DIAGNOSIS — E1122 Type 2 diabetes mellitus with diabetic chronic kidney disease: Secondary | ICD-10-CM | POA: Insufficient documentation

## 2018-10-15 DIAGNOSIS — G4733 Obstructive sleep apnea (adult) (pediatric): Secondary | ICD-10-CM | POA: Diagnosis not present

## 2018-10-15 DIAGNOSIS — Z9981 Dependence on supplemental oxygen: Secondary | ICD-10-CM | POA: Insufficient documentation

## 2018-10-15 DIAGNOSIS — R69 Illness, unspecified: Secondary | ICD-10-CM | POA: Diagnosis not present

## 2018-10-15 DIAGNOSIS — E1165 Type 2 diabetes mellitus with hyperglycemia: Secondary | ICD-10-CM | POA: Diagnosis not present

## 2018-10-15 DIAGNOSIS — I509 Heart failure, unspecified: Secondary | ICD-10-CM | POA: Insufficient documentation

## 2018-10-15 DIAGNOSIS — J449 Chronic obstructive pulmonary disease, unspecified: Secondary | ICD-10-CM | POA: Insufficient documentation

## 2018-10-15 DIAGNOSIS — N189 Chronic kidney disease, unspecified: Secondary | ICD-10-CM | POA: Diagnosis not present

## 2018-10-15 DIAGNOSIS — Z5309 Procedure and treatment not carried out because of other contraindication: Secondary | ICD-10-CM | POA: Diagnosis not present

## 2018-10-15 DIAGNOSIS — J129 Viral pneumonia, unspecified: Secondary | ICD-10-CM | POA: Diagnosis not present

## 2018-10-15 LAB — POCT I-STAT 4, (NA,K, GLUC, HGB,HCT)
Glucose, Bld: 483 mg/dL — ABNORMAL HIGH (ref 70–99)
HEMATOCRIT: 44 % (ref 39.0–52.0)
Hemoglobin: 15 g/dL (ref 13.0–17.0)
Potassium: 3.5 mmol/L (ref 3.5–5.1)
Sodium: 130 mmol/L — ABNORMAL LOW (ref 135–145)

## 2018-10-15 SURGERY — ABLATION, CONDYLOMA, USING LASER
Anesthesia: Monitor Anesthesia Care

## 2018-10-15 MED ORDER — LACTATED RINGERS IV SOLN
INTRAVENOUS | Status: DC
  Filled 2018-10-15: qty 1000

## 2018-10-15 MED ORDER — CEFAZOLIN SODIUM-DEXTROSE 2-4 GM/100ML-% IV SOLN
2.0000 g | Freq: Once | INTRAVENOUS | Status: DC
  Filled 2018-10-15: qty 100

## 2018-10-15 MED ORDER — CEFAZOLIN SODIUM-DEXTROSE 2-4 GM/100ML-% IV SOLN
INTRAVENOUS | Status: AC
  Filled 2018-10-15: qty 100

## 2018-10-15 SURGICAL SUPPLY — 45 items
BLADE HEX COATED 2.75 (ELECTRODE) IMPLANT
BLADE SURG 10 STRL SS (BLADE) IMPLANT
BRIEF STRETCH FOR OB PAD LRG (UNDERPADS AND DIAPERS) IMPLANT
CANISTER SUCT 3000ML PPV (MISCELLANEOUS) IMPLANT
CANISTER SUCTION 1200CC (MISCELLANEOUS) IMPLANT
CLOTH BEACON ORANGE TIMEOUT ST (SAFETY) IMPLANT
COVER BACK TABLE 60X90IN (DRAPES) IMPLANT
COVER MAYO STAND STRL (DRAPES) IMPLANT
COVER WAND RF STERILE (DRAPES) IMPLANT
DECANTER SPIKE VIAL GLASS SM (MISCELLANEOUS) IMPLANT
DEPRESSOR TONGUE BLADE STERILE (MISCELLANEOUS) IMPLANT
DRAPE LAPAROTOMY 100X72 PEDS (DRAPES) IMPLANT
DRAPE UTILITY XL STRL (DRAPES) IMPLANT
ELECT REM PT RETURN 9FT ADLT (ELECTROSURGICAL)
ELECTRODE REM PT RTRN 9FT ADLT (ELECTROSURGICAL) IMPLANT
GAUZE SPONGE 4X4 12PLY STRL (GAUZE/BANDAGES/DRESSINGS) IMPLANT
GLOVE BIO SURGEON STRL SZ 6.5 (GLOVE) IMPLANT
GLOVE BIO SURGEON STRL SZ8 (GLOVE) IMPLANT
GLOVE BIOGEL PI IND STRL 7.0 (GLOVE) IMPLANT
GLOVE BIOGEL PI INDICATOR 7.0 (GLOVE)
GOWN SPEC L3 XXLG W/TWL (GOWN DISPOSABLE) IMPLANT
GOWN STRL REUS W/ TWL LRG LVL3 (GOWN DISPOSABLE) IMPLANT
GOWN STRL REUS W/ TWL XL LVL3 (GOWN DISPOSABLE) IMPLANT
GOWN STRL REUS W/TWL LRG LVL3 (GOWN DISPOSABLE)
GOWN STRL REUS W/TWL XL LVL3 (GOWN DISPOSABLE)
IV NS IRRIG 3000ML ARTHROMATIC (IV SOLUTION) IMPLANT
KIT TURNOVER CYSTO (KITS) IMPLANT
MANIFOLD NEPTUNE II (INSTRUMENTS) IMPLANT
NDL SAFETY ECLIPSE 18X1.5 (NEEDLE) IMPLANT
NEEDLE HYPO 18GX1.5 SHARP (NEEDLE)
NEEDLE HYPO 22GX1.5 SAFETY (NEEDLE) IMPLANT
NS IRRIG 500ML POUR BTL (IV SOLUTION) IMPLANT
PACK BASIN DAY SURGERY FS (CUSTOM PROCEDURE TRAY) IMPLANT
PAD ABD 8X10 STRL (GAUZE/BANDAGES/DRESSINGS) IMPLANT
PENCIL BUTTON HOLSTER BLD 10FT (ELECTRODE) IMPLANT
SPONGE SURGIFOAM ABS GEL 12-7 (HEMOSTASIS) IMPLANT
SUT CHROMIC 2 0 SH (SUTURE) IMPLANT
SUT CHROMIC 3 0 SH 27 (SUTURE) IMPLANT
SYR CONTROL 10ML LL (SYRINGE) IMPLANT
TOWEL OR 17X26 10 PK STRL BLUE (TOWEL DISPOSABLE) IMPLANT
TRAY DSU PREP LF (CUSTOM PROCEDURE TRAY) IMPLANT
TUBE CONNECTING 12X1/4 (SUCTIONS) IMPLANT
VACUUM HOSE 7/8X10 W/ WAND (MISCELLANEOUS) IMPLANT
WATER STERILE IRR 3000ML UROMA (IV SOLUTION) IMPLANT
YANKAUER SUCT BULB TIP NO VENT (SUCTIONS) IMPLANT

## 2018-10-15 NOTE — Progress Notes (Signed)
0700  Patient had a blood sugar of 435 .[name] Spoke with Dr.Thomas and Dr Darlin Drop and it was decided in the best interest of the patient the case should scheduled for another day.

## 2018-10-27 ENCOUNTER — Ambulatory Visit (INDEPENDENT_AMBULATORY_CARE_PROVIDER_SITE_OTHER): Payer: Medicare HMO | Admitting: Podiatry

## 2018-10-27 ENCOUNTER — Other Ambulatory Visit: Payer: Self-pay

## 2018-10-27 ENCOUNTER — Telehealth: Payer: Self-pay

## 2018-10-27 ENCOUNTER — Encounter: Payer: Self-pay | Admitting: Podiatry

## 2018-10-27 DIAGNOSIS — E1142 Type 2 diabetes mellitus with diabetic polyneuropathy: Secondary | ICD-10-CM | POA: Diagnosis not present

## 2018-10-27 DIAGNOSIS — B351 Tinea unguium: Secondary | ICD-10-CM

## 2018-10-27 DIAGNOSIS — M79676 Pain in unspecified toe(s): Secondary | ICD-10-CM

## 2018-10-27 DIAGNOSIS — R69 Illness, unspecified: Secondary | ICD-10-CM | POA: Diagnosis not present

## 2018-10-27 DIAGNOSIS — L84 Corns and callosities: Secondary | ICD-10-CM

## 2018-10-27 DIAGNOSIS — Z0283 Encounter for blood-alcohol and blood-drug test: Secondary | ICD-10-CM

## 2018-10-27 NOTE — Telephone Encounter (Signed)
Pt. Dropped off urine for Drug screening for pain medication.

## 2018-10-27 NOTE — Patient Instructions (Signed)
Diabetes Mellitus and Foot Care Foot care is an important part of your health, especially when you have diabetes. Diabetes may cause you to have problems because of poor blood flow (circulation) to your feet and legs, which can cause your skin to:  Become thinner and drier.  Break more easily.  Heal more slowly.  Peel and crack. You may also have nerve damage (neuropathy) in your legs and feet, causing decreased feeling in them. This means that you may not notice minor injuries to your feet that could lead to more serious problems. Noticing and addressing any potential problems early is the best way to prevent future foot problems. How to care for your feet Foot hygiene  Wash your feet daily with warm water and mild soap. Do not use hot water. Then, pat your feet and the areas between your toes until they are completely dry. Do not soak your feet as this can dry your skin.  Trim your toenails straight across. Do not dig under them or around the cuticle. File the edges of your nails with an emery board or nail file.  Apply a moisturizing lotion or petroleum jelly to the skin on your feet and to dry, brittle toenails. Use lotion that does not contain alcohol and is unscented. Do not apply lotion between your toes. Shoes and socks  Wear clean socks or stockings every day. Make sure they are not too tight. Do not wear knee-high stockings since they may decrease blood flow to your legs.  Wear shoes that fit properly and have enough cushioning. Always look in your shoes before you put them on to be sure there are no objects inside.  To break in new shoes, wear them for just a few hours a day. This prevents injuries on your feet. Wounds, scrapes, corns, and calluses  Check your feet daily for blisters, cuts, bruises, sores, and redness. If you cannot see the bottom of your feet, use a mirror or ask someone for help.  Do not cut corns or calluses or try to remove them with medicine.  If you  find a minor scrape, cut, or break in the skin on your feet, keep it and the skin around it clean and dry. You may clean these areas with mild soap and water. Do not clean the area with peroxide, alcohol, or iodine.  If you have a wound, scrape, corn, or callus on your foot, look at it several times a day to make sure it is healing and not infected. Check for: ? Redness, swelling, or pain. ? Fluid or blood. ? Warmth. ? Pus or a bad smell. General instructions  Do not cross your legs. This may decrease blood flow to your feet.  Do not use heating pads or hot water bottles on your feet. They may burn your skin. If you have lost feeling in your feet or legs, you may not know this is happening until it is too late.  Protect your feet from hot and cold by wearing shoes, such as at the beach or on hot pavement.  Schedule a complete foot exam at least once a year (annually) or more often if you have foot problems. If you have foot problems, report any cuts, sores, or bruises to your health care provider immediately. Contact a health care provider if:  You have a medical condition that increases your risk of infection and you have any cuts, sores, or bruises on your feet.  You have an injury that is not   healing.  You have redness on your legs or feet.  You feel burning or tingling in your legs or feet.  You have pain or cramps in your legs and feet.  Your legs or feet are numb.  Your feet always feel cold.  You have pain around a toenail. Get help right away if:  You have a wound, scrape, corn, or callus on your foot and: ? You have pain, swelling, or redness that gets worse. ? You have fluid or blood coming from the wound, scrape, corn, or callus. ? Your wound, scrape, corn, or callus feels warm to the touch. ? You have pus or a bad smell coming from the wound, scrape, corn, or callus. ? You have a fever. ? You have a red line going up your leg. Summary  Check your feet every day  for cuts, sores, red spots, swelling, and blisters.  Moisturize feet and legs daily.  Wear shoes that fit properly and have enough cushioning.  If you have foot problems, report any cuts, sores, or bruises to your health care provider immediately.  Schedule a complete foot exam at least once a year (annually) or more often if you have foot problems. This information is not intended to replace advice given to you by your health care provider. Make sure you discuss any questions you have with your health care provider. Document Released: 07/25/2000 Document Revised: 09/09/2017 Document Reviewed: 08/29/2016 Elsevier Interactive Patient Education  2019 [employer]  Onychomycosis/Fungal Toenails  WHAT IS IT? An infection that lies within the keratin of your nail plate that is caused by a fungus.  WHY ME? Fungal infections affect all ages, sexes, races, and creeds.  There may be many factors that predispose you to a fungal infection such as age, coexisting medical conditions such as diabetes, or an autoimmune disease; stress, medications, fatigue, genetics, etc.  Bottom line: fungus thrives in a warm, moist environment and your shoes offer such a location.  IS IT CONTAGIOUS? Theoretically, yes.  You do not want to share shoes, nail clippers or files with someone who has fungal toenails.  Walking around barefoot in the same room or sleeping in the same bed is unlikely to transfer the organism.  It is important to realize, however, that fungus can spread easily from one nail to the next on the same foot.  HOW DO WE TREAT THIS?  There are several ways to treat this condition.  Treatment may depend on many factors such as age, medications, pregnancy, liver and kidney conditions, etc.  It is best to ask your doctor which options are available to you.  1. No treatment.   Unlike many other medical concerns, you can live with this condition.  However for many people this can be a painful condition and  may lead to ingrown toenails or a bacterial infection.  It is recommended that you keep the nails cut short to help reduce the amount of fungal nail. 2. Topical treatment.  These range from herbal remedies to prescription strength nail lacquers.  About 40-50% effective, topicals require twice daily application for approximately 9 to 12 months or until an entirely new nail has grown out.  The most effective topicals are medical grade medications available through physicians offices. 3. Oral antifungal medications.  With an 80-90% cure rate, the most common oral medication requires 3 to 4 months of therapy and stays in your system for a year as the new nail grows out.  Oral antifungal medications do require   blood work to make sure it is a safe drug for you.  A liver function panel will be performed prior to starting the medication and after the first month of treatment.  It is important to have the blood work performed to avoid any harmful side effects.  In general, this medication safe but blood work is required. 4. Laser Therapy.  This treatment is performed by applying a specialized laser to the affected nail plate.  This therapy is noninvasive, fast, and non-painful.  It is not covered by insurance and is therefore, out of pocket.  The results have been very good with a 80-95% cure rate.  The [facility] is the only practice in the area to offer this therapy. 5. Permanent Nail Avulsion.  Removing the entire nail so that a new nail will not grow back. 

## 2018-10-27 NOTE — Telephone Encounter (Signed)
CMA called patient and to inform he need to stop by the clinic to give urine sample for his pain medication.  Patient understood.

## 2018-10-27 NOTE — Progress Notes (Signed)
Subjective: Chris [name]. presents with diabetes, early diabetic neuropathy and cc of painful, discolored, thick toenails and painful callus/corn which interfere with activities of daily living. Pain is aggravated when wearing enclosed shoe gear. Pain is relieved with periodic professional debridement.  [name], NP is his PCP and last visit was 08/24/2018.  Chris [name] voices no new pedal problems on today's visit.   Current Outpatient Medications:  .  albuterol (PROVENTIL HFA;VENTOLIN HFA) 108 (90 Base) MCG/ACT inhaler, Inhale 2 puffs into the lungs every 6 (six) hours as needed for wheezing or shortness of breath., Disp: 1 Inhaler, Rfl: 5 .  apixaban (ELIQUIS) 5 MG TABS tablet, Take 1 tablet (5 mg total) by mouth 2 (two) times daily., Disp: 180 tablet, Rfl: 3 .  atorvastatin (LIPITOR) 40 MG tablet, Take 1 tablet (40 mg total) by mouth daily. (Patient not taking: Reported on 10/04/2018), Disp: 90 tablet, Rfl: 1 .  BIKTARVY 50-200-25 MG TABS tablet, TAKE 1 TABLET BY MOUTH EVERY DAY (Patient taking differently: Takes at 200 pm), Disp: 30 tablet, Rfl: 0 .  Blood Glucose Monitoring Suppl (ONETOUCH VERIO) w/Device KIT, 1 kit by Does not apply route 2 (two) times daily., Disp: 1 kit, Rfl: 0 .  diltiazem (CARDIZEM CD) 240 MG 24 hr capsule, Take 1 capsule (240 mg total) by mouth daily., Disp: 90 capsule, Rfl: 3 .  DULoxetine (CYMBALTA) 30 MG capsule, Take 1 capsule (30 mg total) by mouth daily for 30 days., Disp: 90 capsule, Rfl: 1 .  glucose blood (ONETOUCH VERIO) test strip, Use as instructed, Disp: 100 each, Rfl: 12 .  Glycopyrrolate-Formoterol (BEVESPI AEROSPHERE) 9-4.8 MCG/ACT AERO, Inhale 2 puffs into the lungs 2 (two) times daily., Disp: 1 Inhaler, Rfl: 0 .  hydrALAZINE (APRESOLINE) 50 MG tablet, Take 1.5 tablets (75 mg total) by mouth every 8 (eight) hours., Disp: 405 tablet, Rfl: 1 .  metFORMIN (GLUCOPHAGE) 1000 MG tablet, Take 1 tablet (1,000 mg total) by mouth daily with breakfast.  (Patient taking differently: Take 1,000 mg by mouth 2 (two) times daily with a meal. ), Disp: 180 tablet, Rfl: 3 .  ONETOUCH DELICA LANCETS 60V MISC, Use as instructed, Disp: 100 each, Rfl: 3 .  OXYGEN, Inhale 2 L into the lungs as needed (shortness of breath). Uses every 2 days, Disp: , Rfl:  .  predniSONE (DELTASONE) 10 MG tablet, Take  4 each am x 2 days,   2 each am x 2 days,  1 each am x 2 days and stop, Disp: 14 tablet, Rfl: 0 .  sildenafil (VIAGRA) 25 MG tablet, Take 1 tablet (25 mg total) by mouth daily as needed for erectile dysfunction., Disp: 10 tablet, Rfl: 0  Allergies  Allergen Reactions  . Bactrim [Sulfamethoxazole-Trimethoprim] Hives    Vascular Examination: Capillary refill time  immediate x 10 digits.  Dorsalis pedis and Posterior tibial pulses 2/4 b/l.  Sparse digital hair x 10 digits.  Skin temperature gradient WNL b/l.  Dermatological Examination: Skin with normal turgor, texture and tone b/l.  Toenails 1-5 b/l discolored, thick, dystrophic with subungual debris and pain with palpation to nailbeds due to thickness of nails.  Hyperkeratotic lesion b/l hallux and submet head 4 left foot. No erythema, no edema, no drainage, no flocculence noted.  Musculoskeletal: Muscle strength 5/5 to all LE muscle groups.  Mild hammertoe deformity b/l 4, 5.  Neurological: Sensation intact with 10 gram monofilament.  Vibratory sensation diminished b/l.  Assessment: 1. Painful onychomycosis toenails 1-5 b/l 2. Calluses b/l  hallux and submet head 4 left foot 3. NIDDM with early Diabetic neuropathy  Plan: 1. Continue diabetic foot care principles.  2. Toenails 1-5 b/l were debrided in length and girth without iatrogenic bleeding. Calluses pared submetatarsal head(s) 4 left foot and b/l hallux utilizing sterile scalpel blade without incident. 3. Patient to continue soft, supportive shoe gear daily. 4. Patient to report any pedal injuries to medical professional  immediately. 5. Follow up 3 months.  6. Patient/POA to call should there be a concern in the interim.

## 2018-10-28 LAB — DRUG SCREEN, URINE
Amphetamines, Urine: NEGATIVE ng/mL
Barbiturate screen, urine: NEGATIVE ng/mL
Benzodiazepine Quant, Ur: NEGATIVE ng/mL
Cannabinoid Quant, Ur: NEGATIVE ng/mL
Cocaine (Metab.): NEGATIVE ng/mL
Opiate Quant, Ur: POSITIVE ng/mL — AB
PCP Quant, Ur: NEGATIVE ng/mL

## 2018-10-28 IMAGING — CR DG CHEST 2V
2 series · 2 of 2 positions shown · non-contrast
Comparison: December 21, 2017

CLINICAL DATA: Pneumonia for 2 weeks.

EXAM:
CHEST - 2 VIEW

[chest lat]
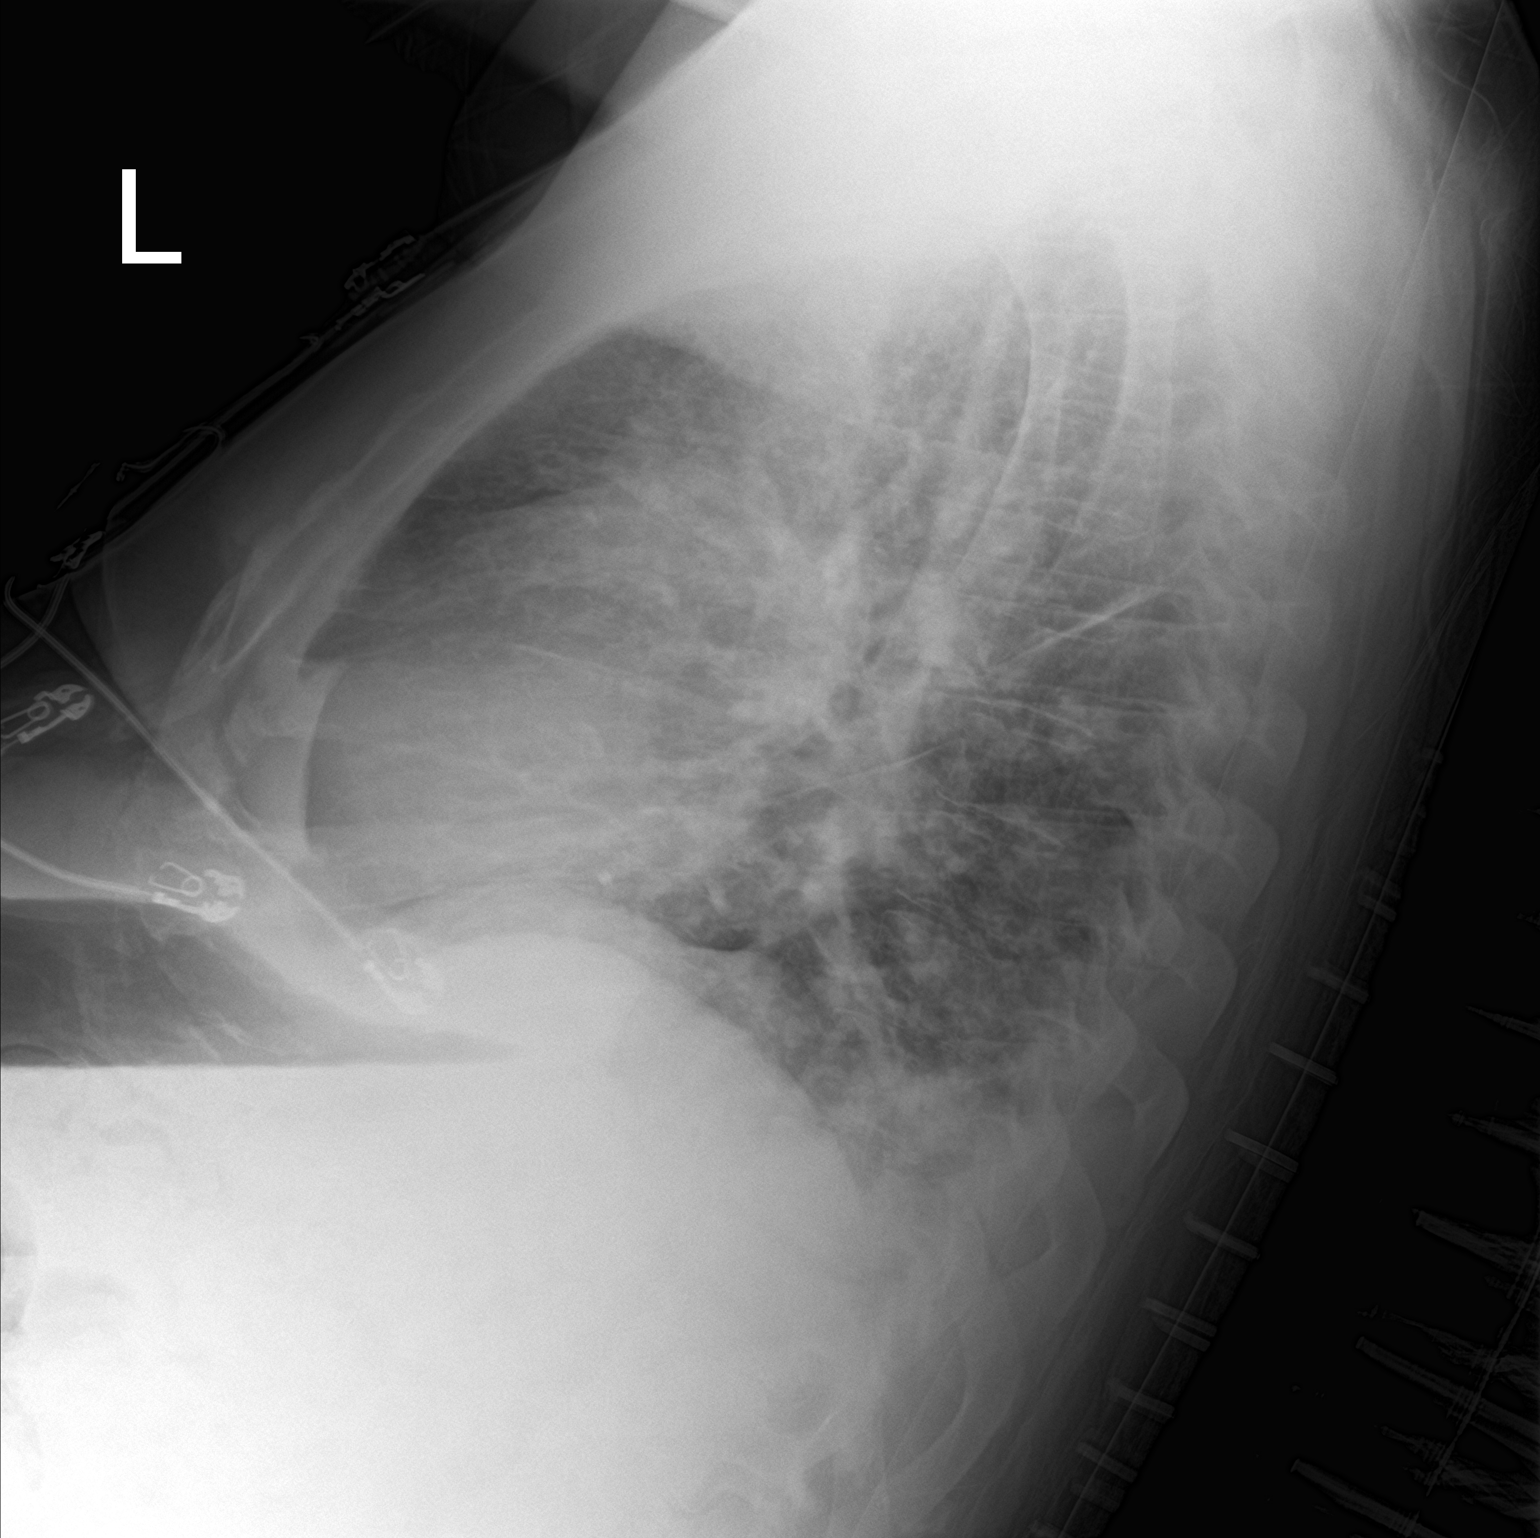

[chest ap]
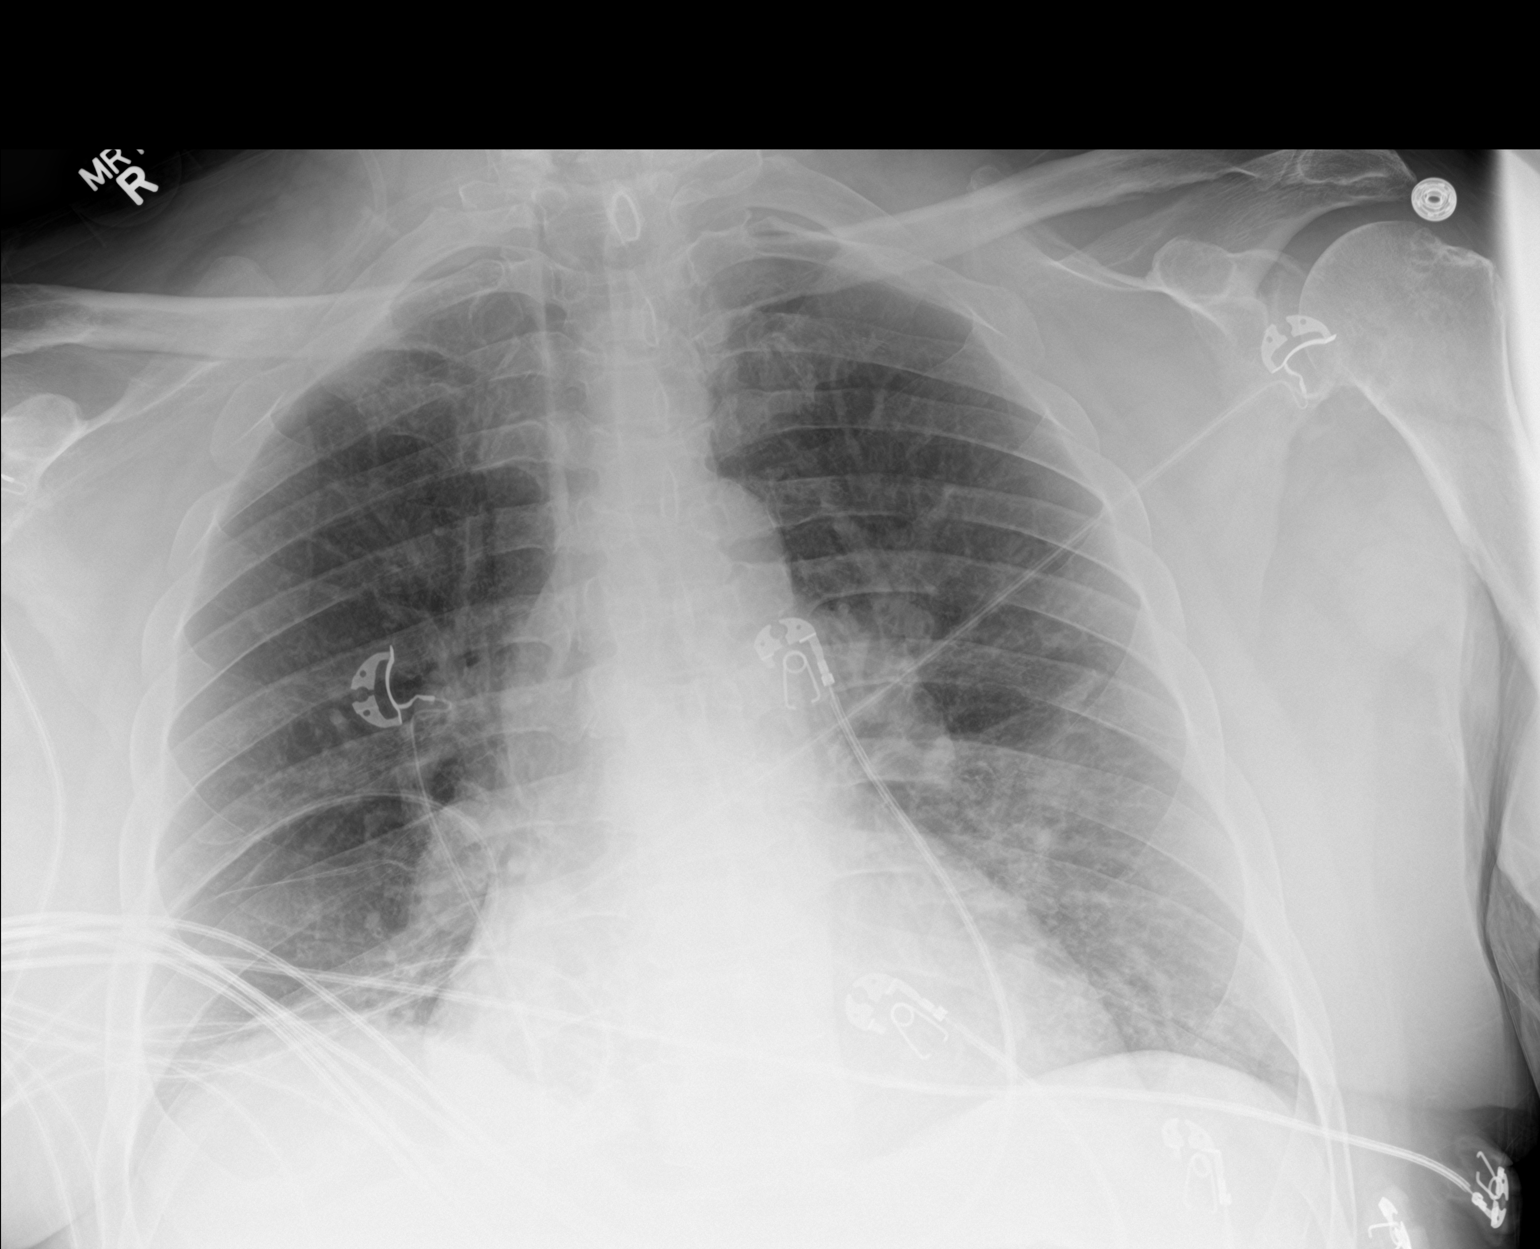

[2 of 2 positions shown; findings below may reference images not displayed]

FINDINGS: No pneumothorax. The opacity in the medial right lung base
previously has resolved. There is mild patchy opacity in left base.
Cardiomegaly. The hila and mediastinum are normal. No other acute
abnormalities.
IMPRESSION: Patchy left basilar infiltrate.  Recommend follow-up to resolution.

## 2018-11-01 ENCOUNTER — Telehealth: Payer: Self-pay | Admitting: Nurse Practitioner

## 2018-11-01 ENCOUNTER — Other Ambulatory Visit: Payer: Self-pay | Admitting: Nurse Practitioner

## 2018-11-01 IMAGING — DX DG CHEST 2V
2 series · 2 of 2 positions shown · non-contrast
Comparison: 01/14/2018, 10/17/2017

CLINICAL DATA: Shortness of breath.

EXAM:
CHEST - 2 VIEW

[chest lat]
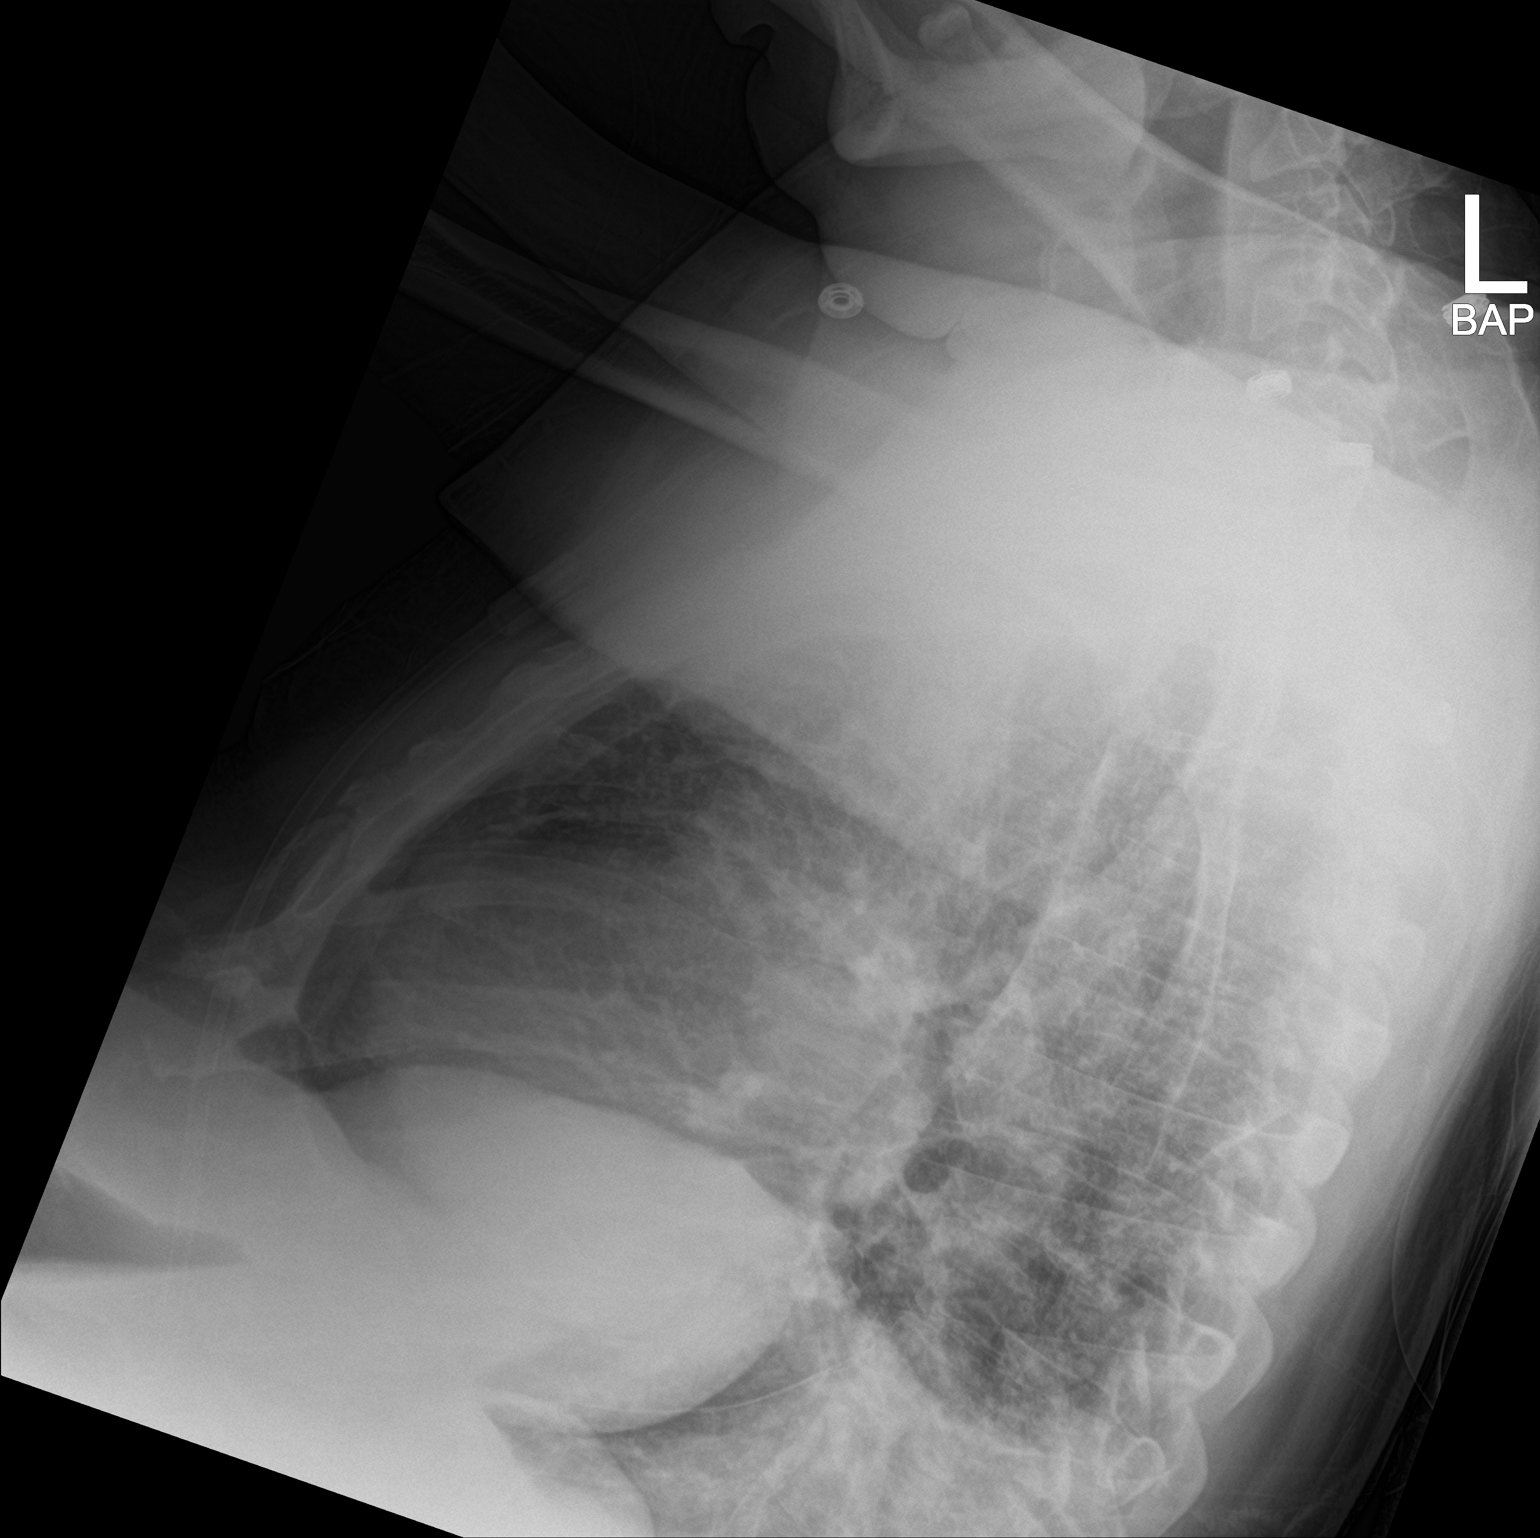

[chest ap]
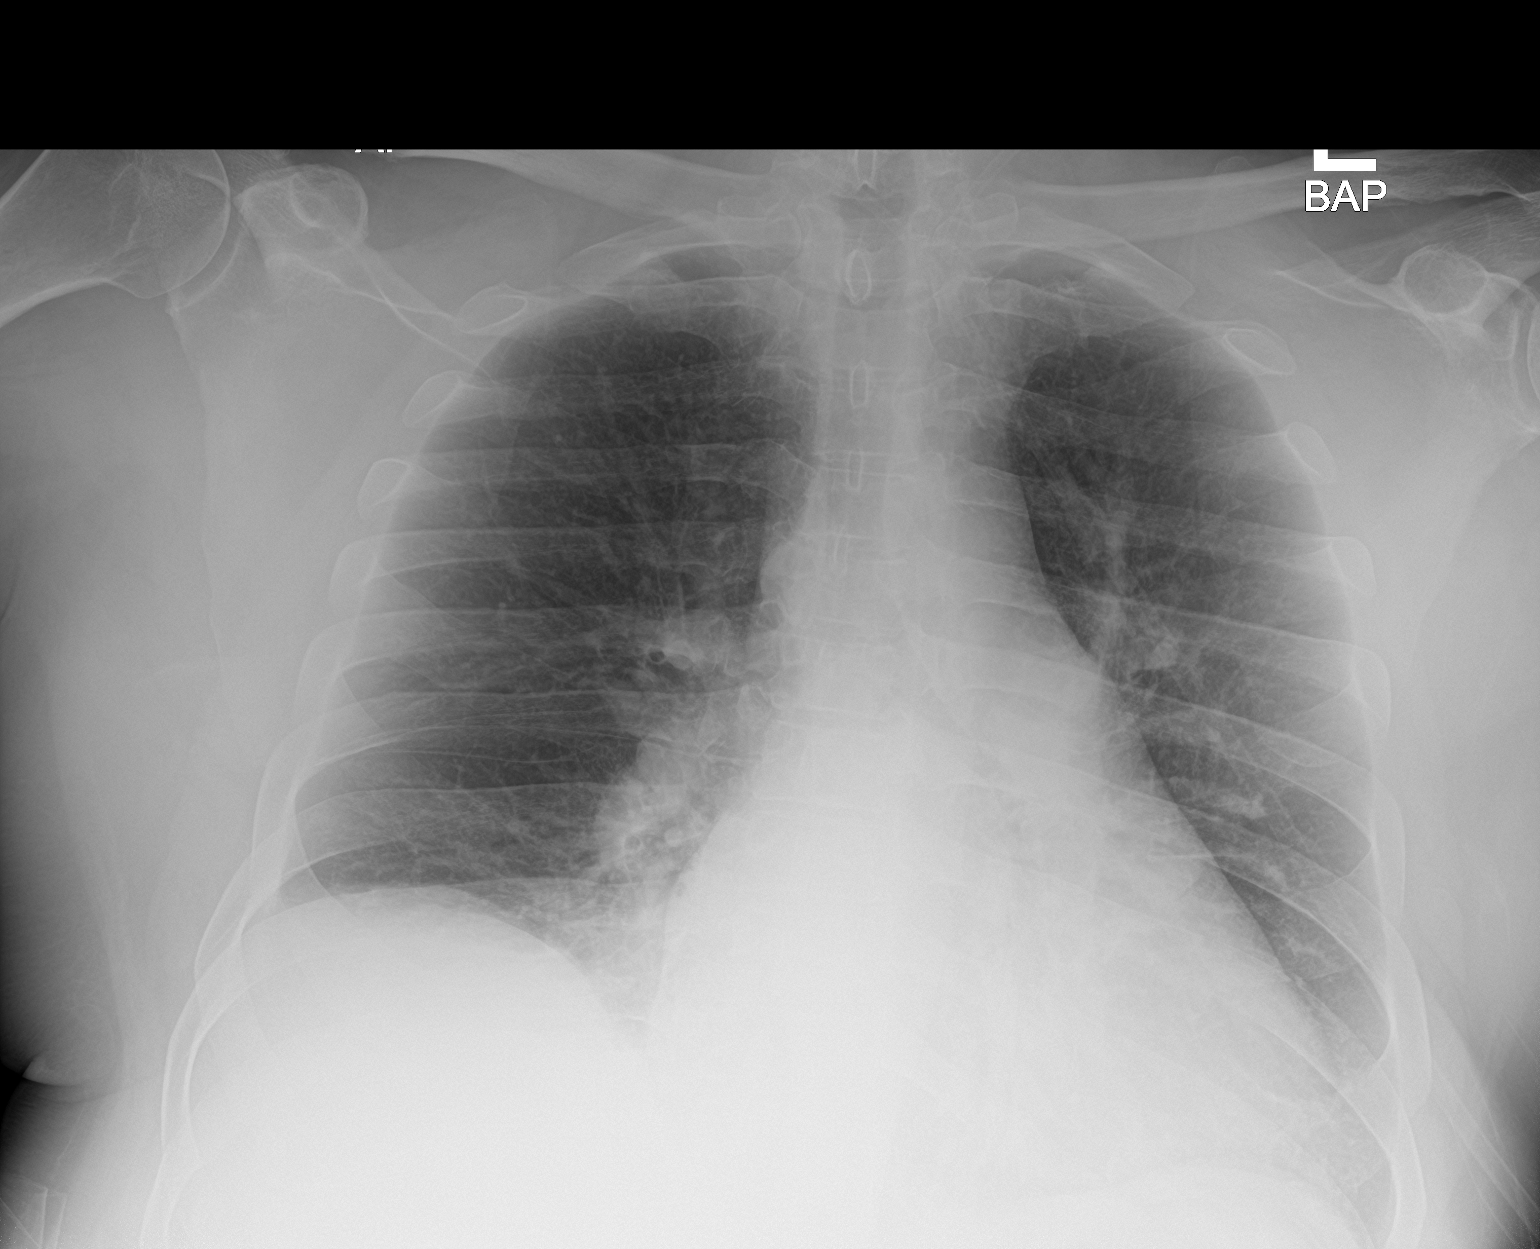

[2 of 2 positions shown; findings below may reference images not displayed]

FINDINGS: There is no focal consolidation. There is mild bilateral
interstitial prominence. There is no pleural effusion or
pneumothorax. The heart and mediastinal contours are unremarkable.

The osseous structures are unremarkable.
IMPRESSION: 1. No focal consolidation to suggest pneumonia.
2. Bilateral mild interstitial thickening similar to multiple prior
exams.

## 2018-11-01 NOTE — Telephone Encounter (Signed)
Will route to PCP 

## 2018-11-01 NOTE — Telephone Encounter (Signed)
Follow up   Pt is calling to check on the results to his urine sample so he can get his pain medication. Please f/u

## 2018-11-02 ENCOUNTER — Other Ambulatory Visit: Payer: Self-pay | Admitting: Nurse Practitioner

## 2018-11-02 MED ORDER — ACETAMINOPHEN-CODEINE #3 300-30 MG PO TABS: 1.0000 | ORAL_TABLET | Freq: Four times a day (QID) | ORAL | 0 refills | Status: AC | PRN

## 2018-11-02 NOTE — Telephone Encounter (Signed)
-----   Message from Claiborne Rigg, NP sent at 11/02/2018 10:42 AM EDT ----- UDS showing codeine. At this time I will prescribed tylenol #3. However we will need to repeat a UDS in the future.

## 2018-11-02 NOTE — Telephone Encounter (Signed)
CMA spoke to patient.  Patient verified DOB. Pt. Understood.

## 2018-11-04 IMAGING — DX DG CHEST 1V PORT
2 series · 2 of 2 positions shown · non-contrast
Comparison: 01/18/2018

CLINICAL DATA: Shortness of breath

EXAM:
PORTABLE CHEST 1 VIEW

[chest ap (1 of 2)]
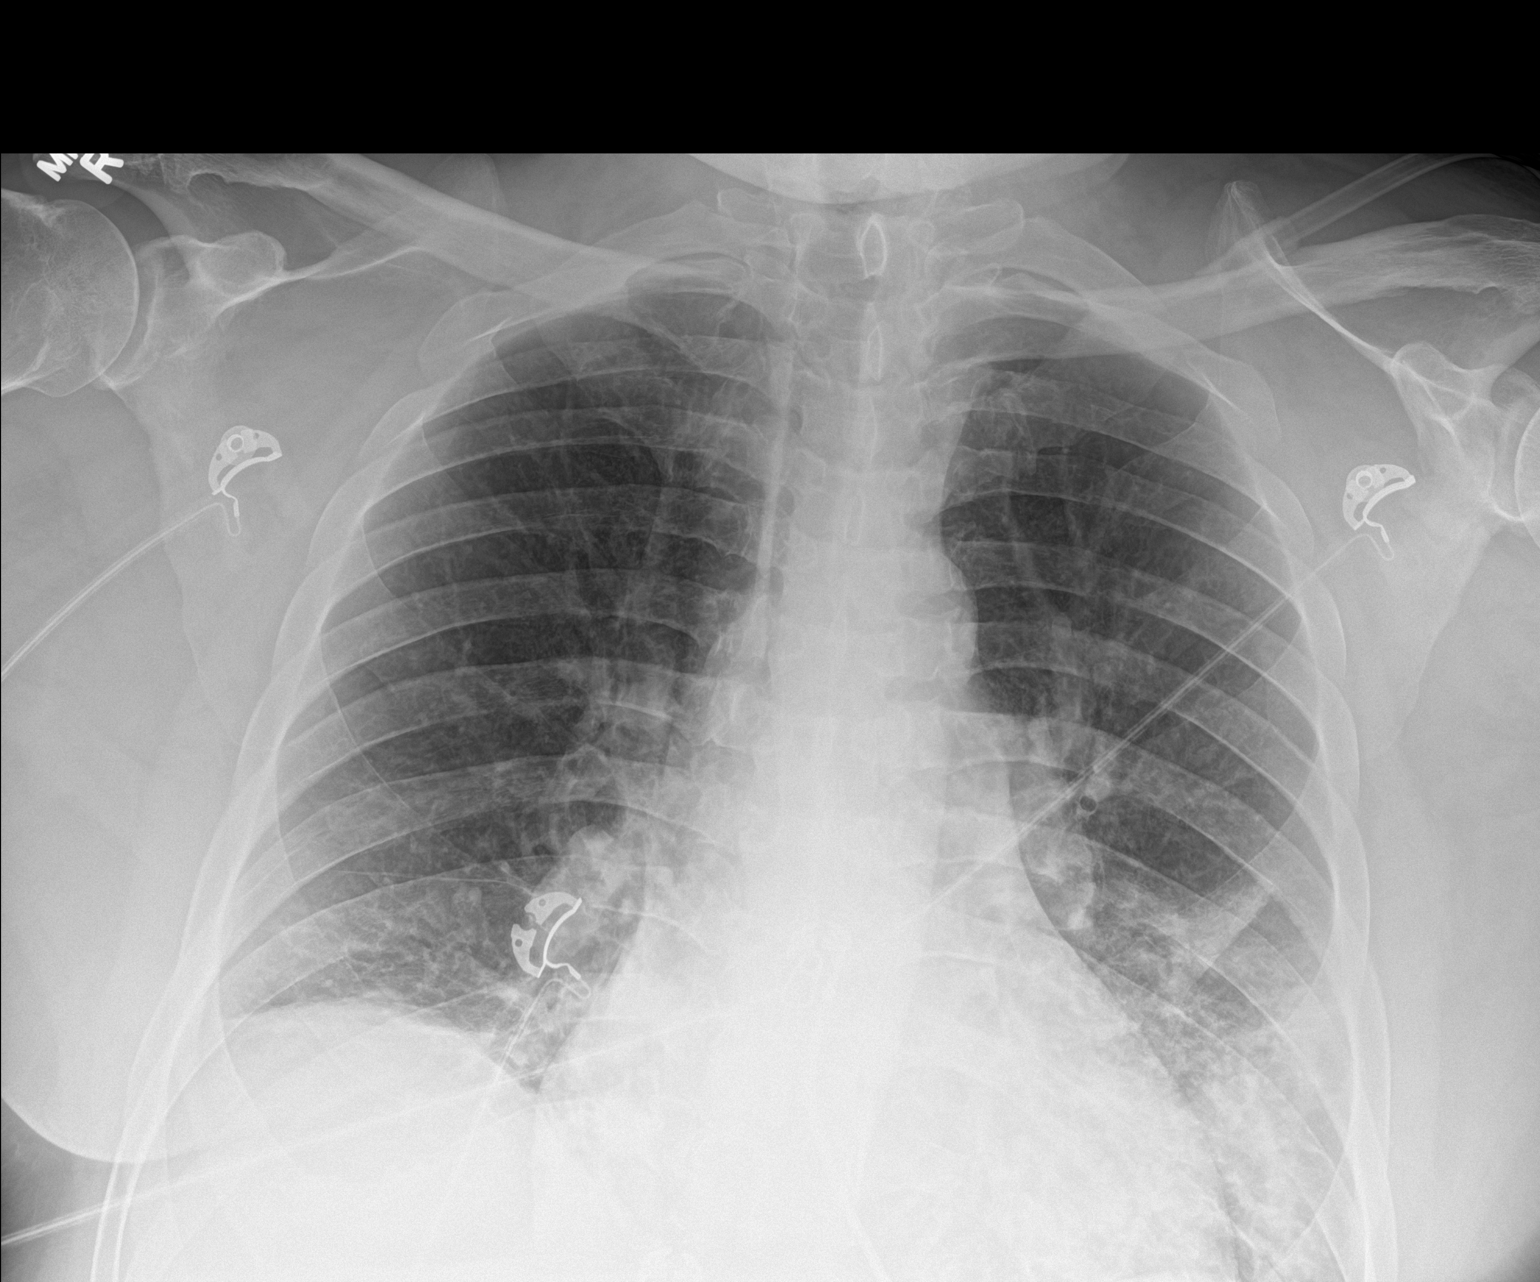

[chest ap (2 of 2)]
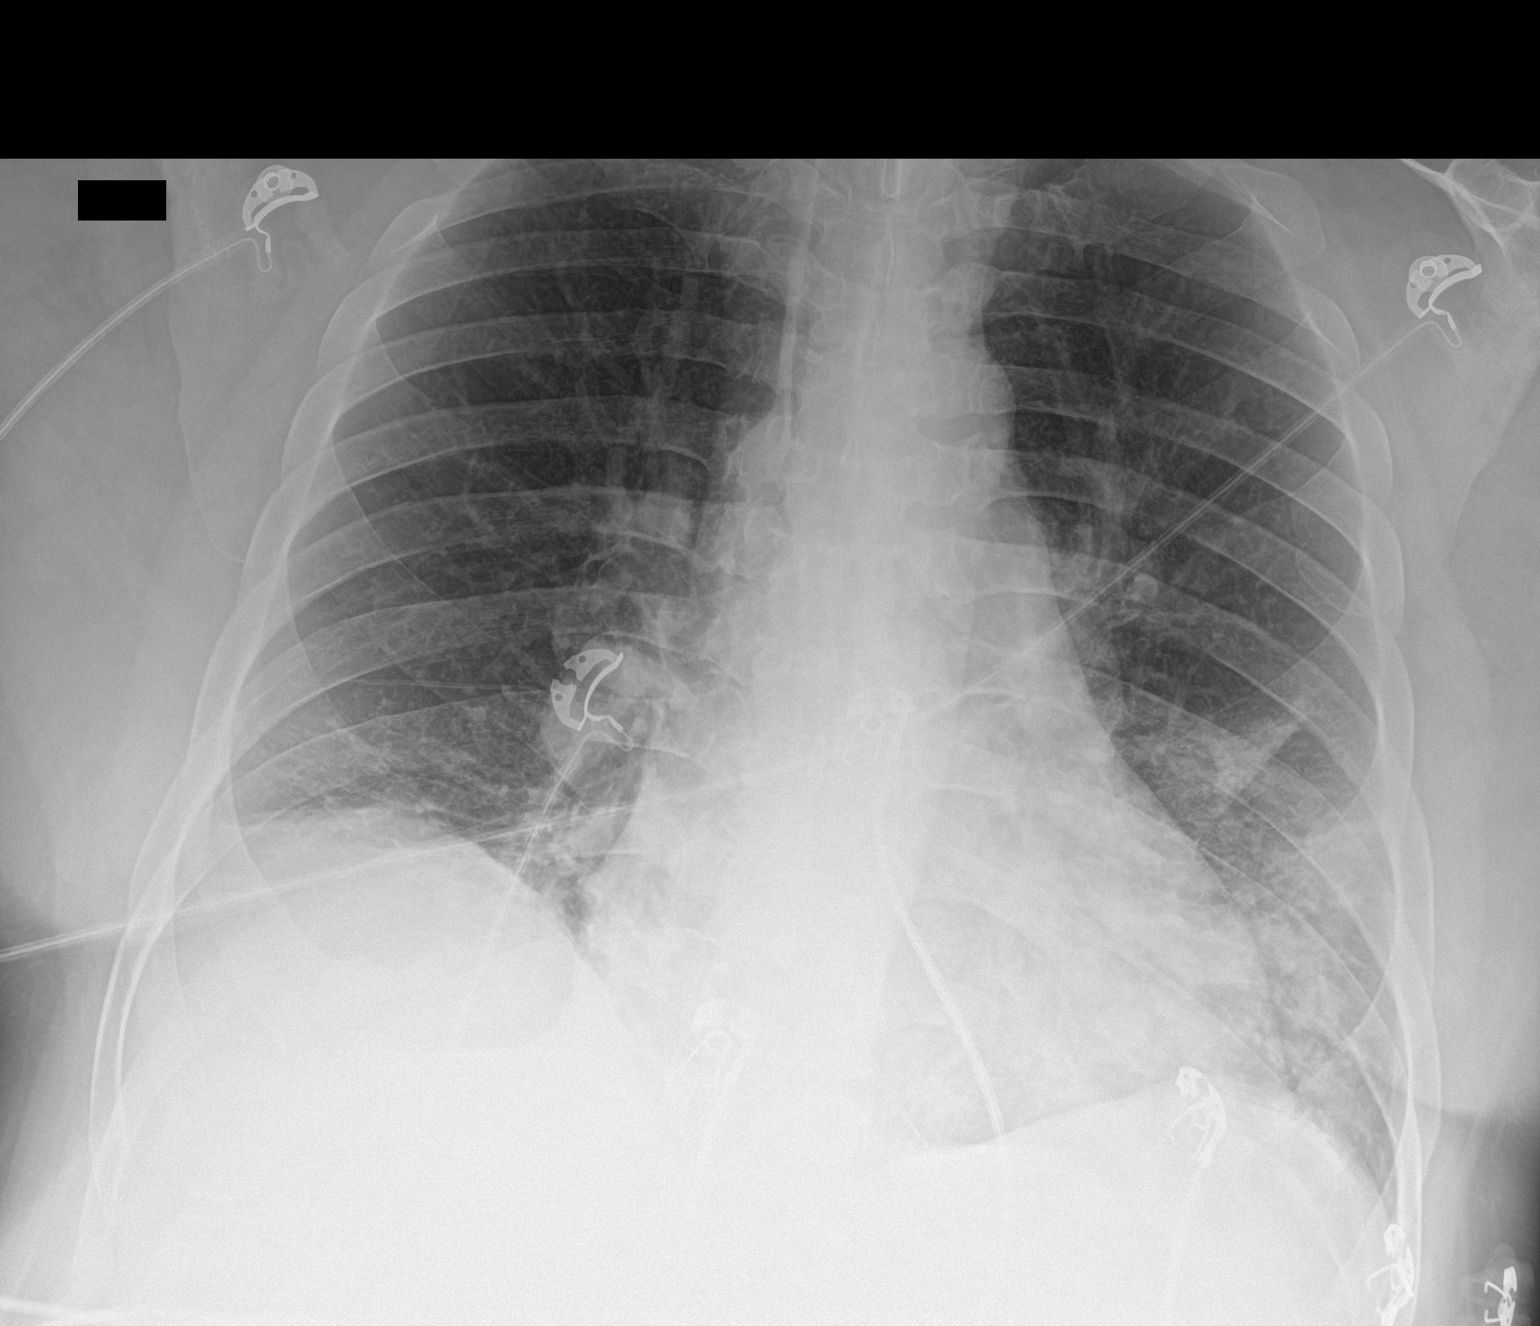

[2 of 2 positions shown; findings below may reference images not displayed]

FINDINGS: Cardiac shadow is mildly enlarged but stable. Increased patchy
infiltrate is noted in the left base with some right basilar
atelectasis noted. No sizable effusion is seen.
IMPRESSION: New left basilar pneumonia with right basilar atelectasis.

## 2018-11-04 IMAGING — CT CT CHEST W/O CM
2 of 4 series · 15 of 36 positions shown, 18 images · non-contrast
Comparison: Chest radiograph January 21, 2018 and CT chest [place]

CLINICAL DATA: Chronic cough. Follow up pneumonia. History of HIV
with low CD4, diabetes, COPD, CHF.

EXAM:
CT CHEST WITHOUT CONTRAST
TECHNIQUE: Multidetector CT imaging of the chest was performed following the
standard protocol without IV contrast.

[Series 3: chest wo · axial · 0.79mm/px · z∈[+958,+1224]mm · 12 of 159 slices shown, 15 images]
[im 13/159  mediastinal]
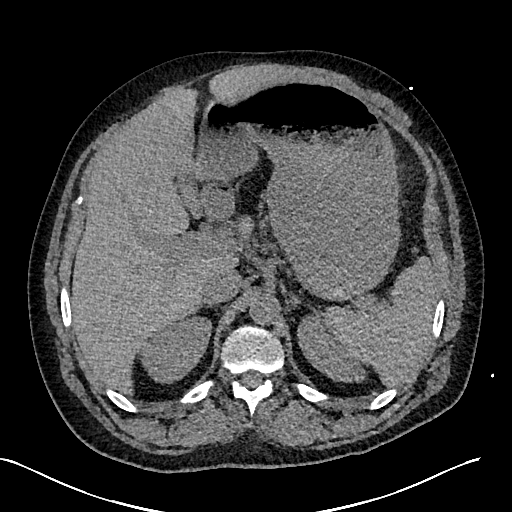
[im 13/159  lung]
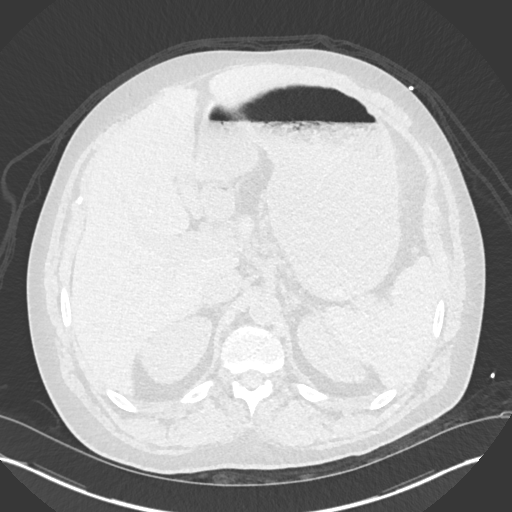
[im 25/159  lung]
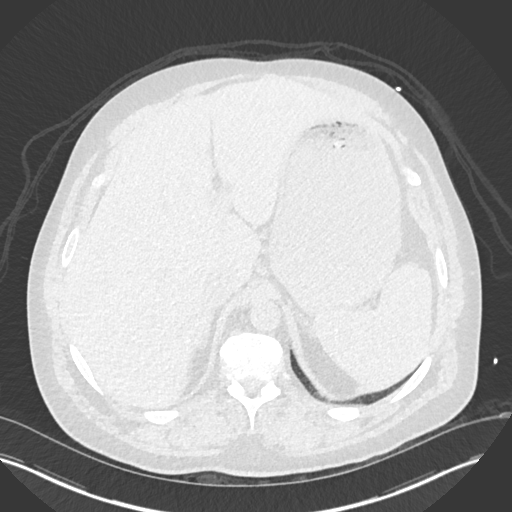
[im 37/159  lung]
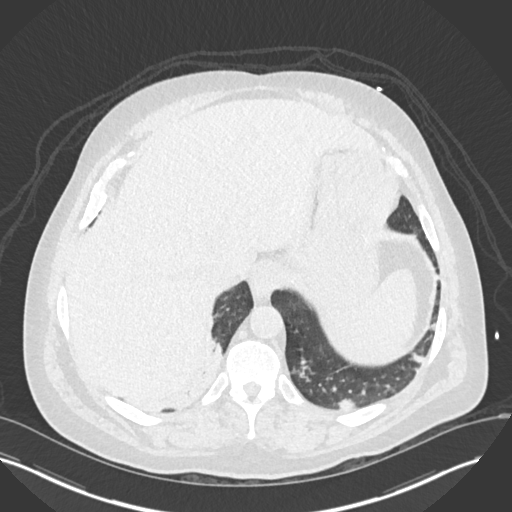
[im 49/159  lung]
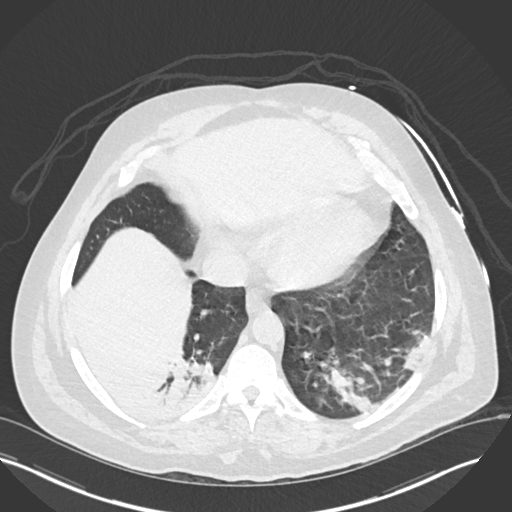
[im 61/159  mediastinal]
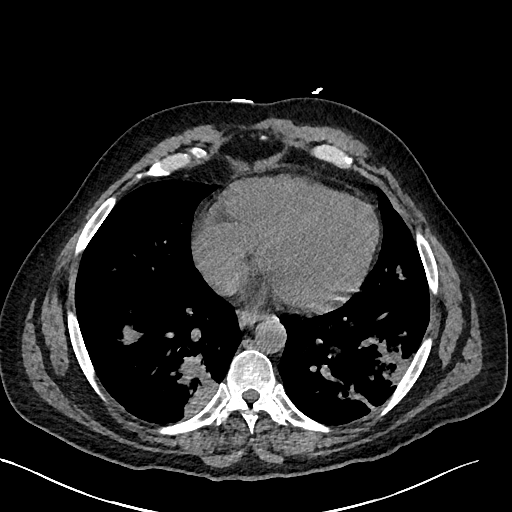
[im 61/159  lung]
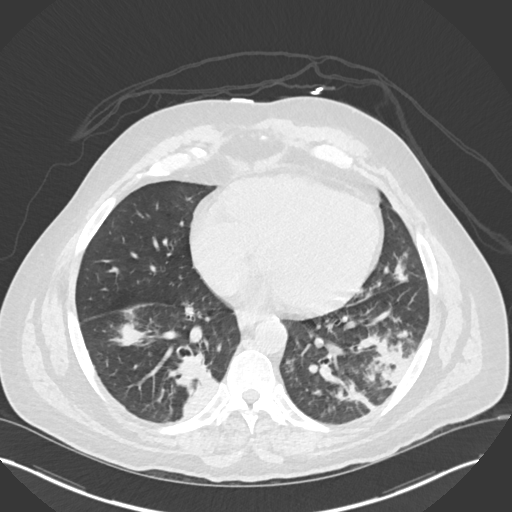
[im 73/159  lung]
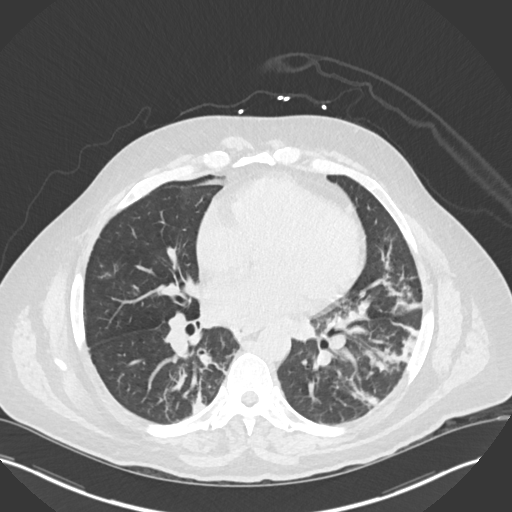
[im 86/159  lung]
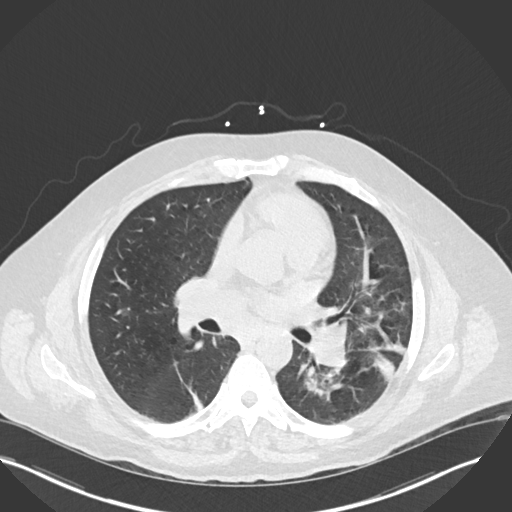
[im 98/159  lung]
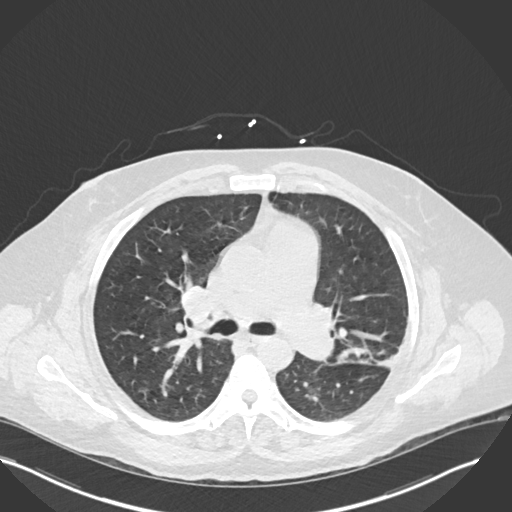
[im 110/159  mediastinal]
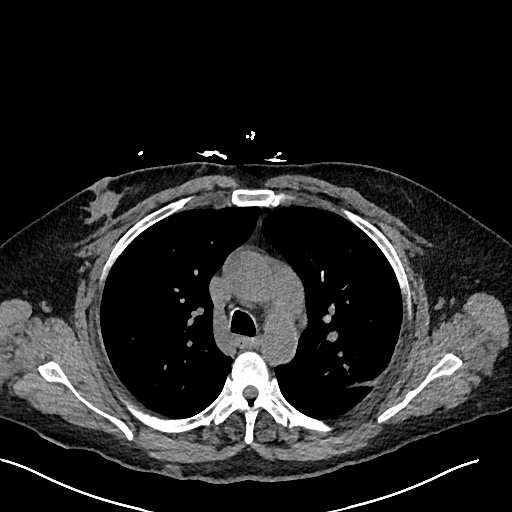
[im 110/159  lung]
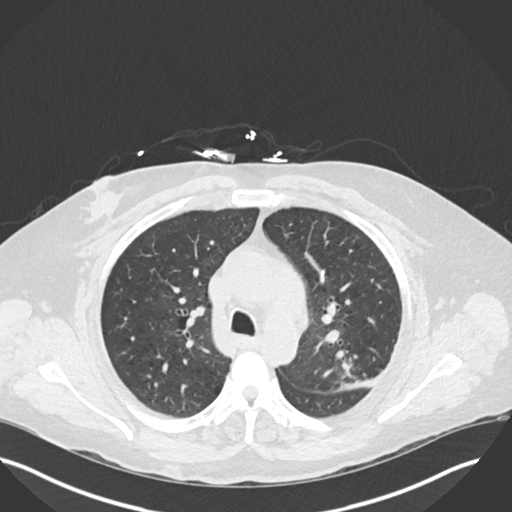
[im 122/159  lung]
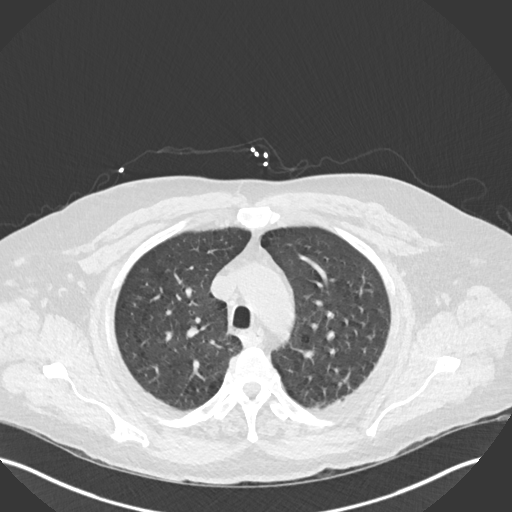
[im 134/159  lung]
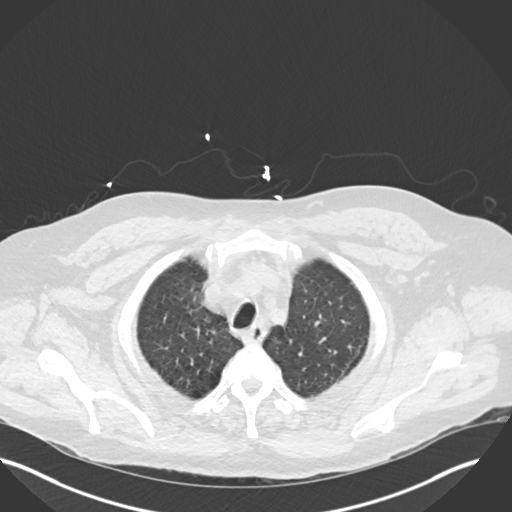
[im 146/159  lung]
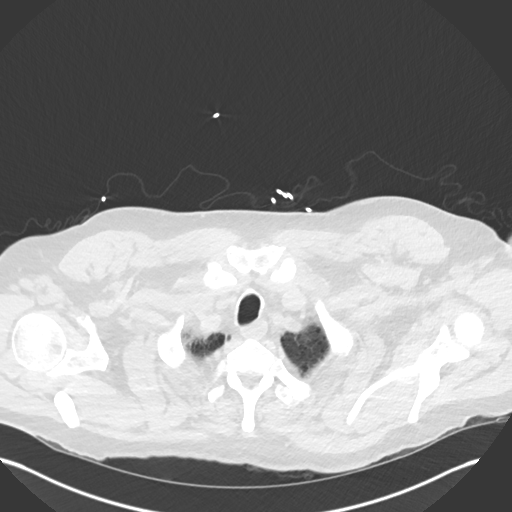

[Series 6: cor · coronal · 0.62mm/px · 3 of 171 slices shown]
[im 35/171  lung]
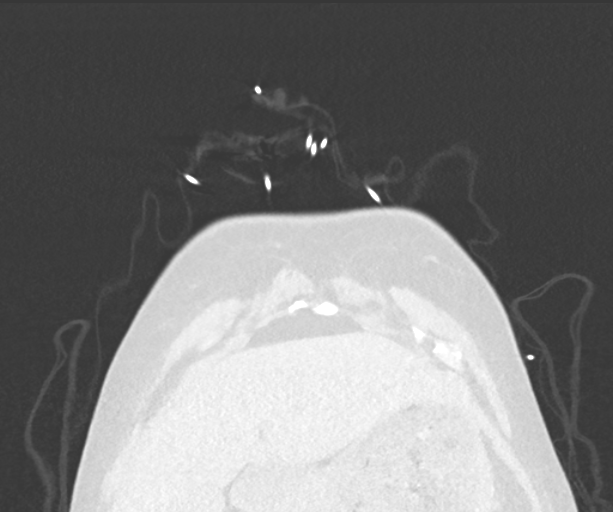
[im 69/171  lung]
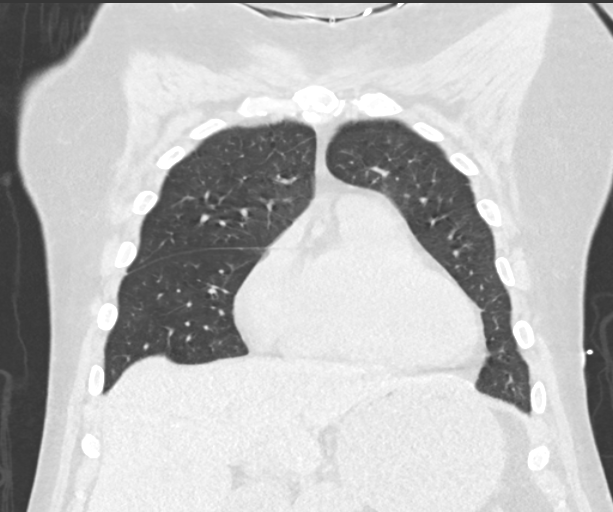
[im 103/171  lung]
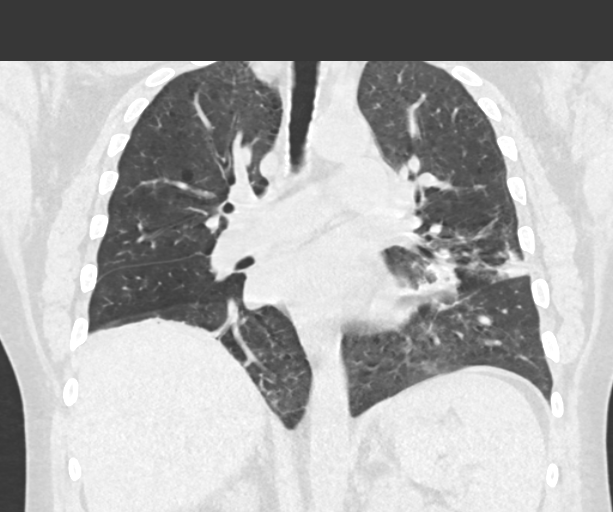

[15 of 36 positions shown; findings below may reference images not displayed]

FINDINGS: Mild respiratory motion degraded examination.

CARDIOVASCULAR: Heart and pericardium are unremarkable. Ascending
aorta remains top-normal in size. Mild calcific atherosclerosis.
Heart is mildly enlarged. No pericardial effusion. Trace coronary
artery calcifications.

MEDIASTINUM/NODES: No mediastinal mass. No lymphadenopathy by CT
size criteria though sensitivity decreased without intravenous
contrast. Trace paratracheal pneumomediastinum versus motion
artifact.

LUNGS/PLEURA: Tracheobronchial tree is patent, no pneumothorax.
Subcentimeter focal soft tissue RIGHT posterior main stem bronchus.
Moderate centrilobular emphysema. Multifocal consolidation bilateral
lower lobes and to lesser extent LEFT upper lobe with air
bronchograms. Neck ground-glass nodules lingula, tree-in-bud
infiltrates RIGHT middle lobe. 5 mm subsolid RIGHT middle lobe
stable pulmonary nodule. Increased lung volumes with flattened
hemidiaphragms. Pleural effusion.

UPPER ABDOMEN: Nonacute.

MUSCULOSKELETAL: Nonacute.  Mild gynecomastia.
IMPRESSION: 1. Multifocal pneumonia and bronchiolitis. Subcentimeter RIGHT
mainstem bronchus debris versus nodule.
2. Trace paratracheal pneumomediastinum versus motion artifact.
3. Mild cardiomegaly.

Aortic Atherosclerosis (6I8HC-OYY.Y) and Emphysema (6I8HC-8TU.A).

## 2018-11-10 ENCOUNTER — Other Ambulatory Visit: Payer: Self-pay | Admitting: Internal Medicine

## 2018-11-15 DIAGNOSIS — J129 Viral pneumonia, unspecified: Secondary | ICD-10-CM | POA: Diagnosis not present

## 2018-11-15 DIAGNOSIS — J449 Chronic obstructive pulmonary disease, unspecified: Secondary | ICD-10-CM | POA: Diagnosis not present

## 2018-11-23 IMAGING — DX DG CHEST 2V
2 series · 2 of 2 positions shown · non-contrast
Comparison: Radiographs January 21, 2018.

CLINICAL DATA: Shortness of breath, cough.

EXAM:
CHEST - 2 VIEW

[chest pa]
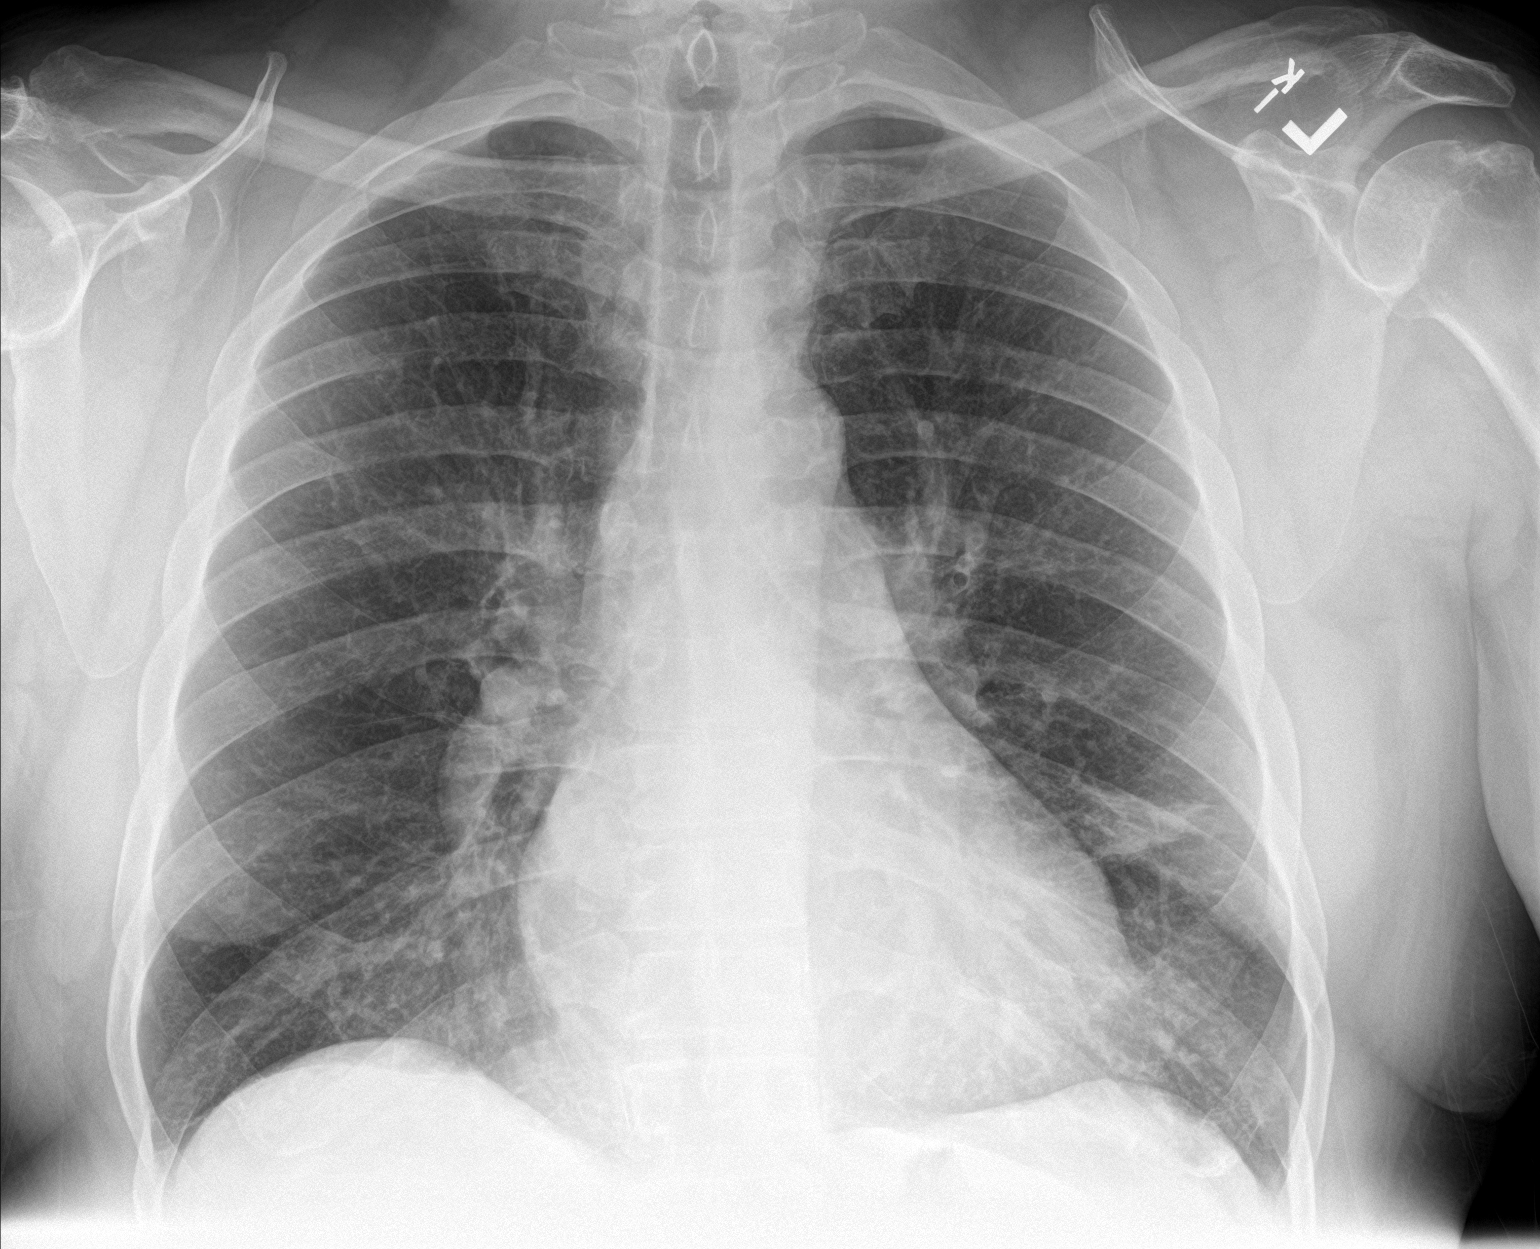

[chest lat]
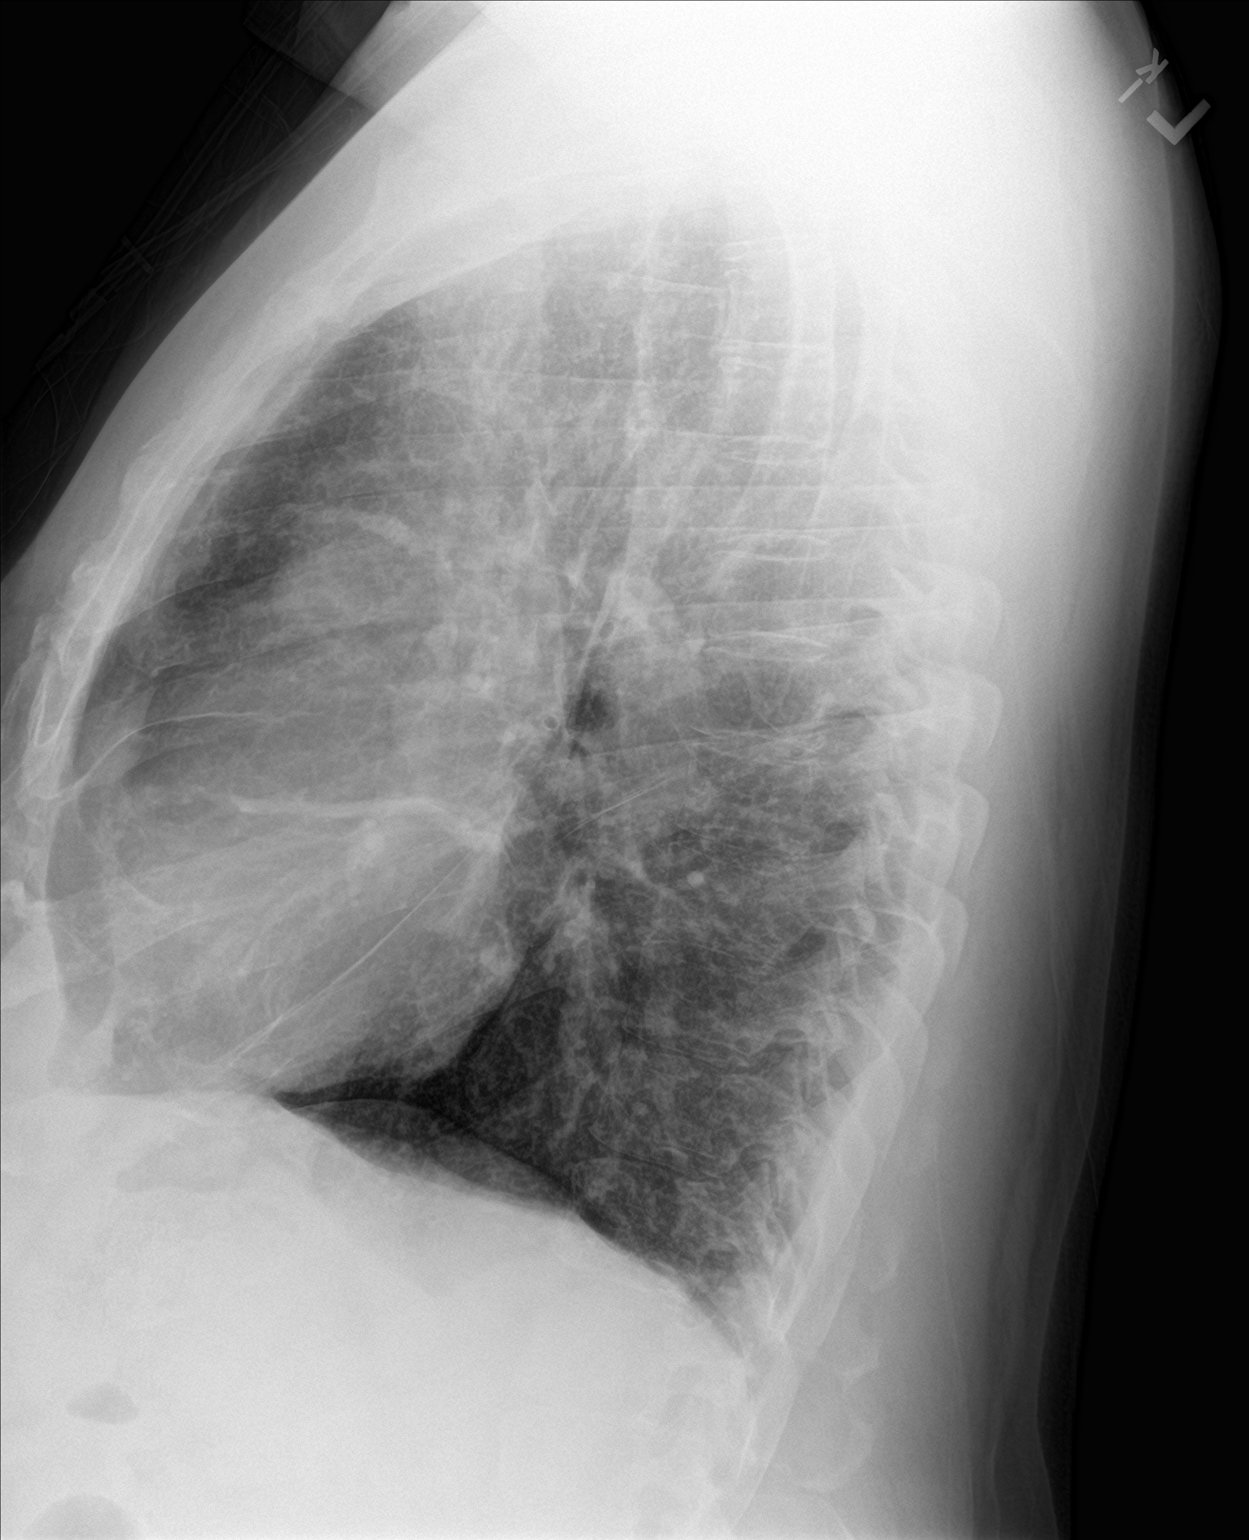

[2 of 2 positions shown; findings below may reference images not displayed]

FINDINGS: Stable cardiomediastinal silhouette. No pneumothorax or pleural
effusion is noted. Right lung is clear. Left basilar opacity is
significantly improved compared to prior exam consistent with
residual inflammation or atelectasis. Bony thorax is unremarkable.
IMPRESSION: Significantly improved left basilar opacity is noted consistent with
residual pneumonia or subsegmental atelectasis.

## 2018-11-24 ENCOUNTER — Other Ambulatory Visit: Payer: Self-pay

## 2018-11-24 ENCOUNTER — Ambulatory Visit: Payer: Medicare HMO | Attending: Nurse Practitioner | Admitting: Nurse Practitioner

## 2018-11-24 ENCOUNTER — Encounter: Payer: Self-pay | Admitting: Nurse Practitioner

## 2018-11-24 ENCOUNTER — Ambulatory Visit: Payer: Medicare HMO | Admitting: Nurse Practitioner

## 2018-11-24 VITALS — BP 134/69 | HR 83 | Temp 98.8°F | Ht 69.0 in | Wt 244.0 lb

## 2018-11-24 DIAGNOSIS — E1165 Type 2 diabetes mellitus with hyperglycemia: Secondary | ICD-10-CM | POA: Diagnosis not present

## 2018-11-24 DIAGNOSIS — I10 Essential (primary) hypertension: Secondary | ICD-10-CM | POA: Diagnosis not present

## 2018-11-24 DIAGNOSIS — Z794 Long term (current) use of insulin: Secondary | ICD-10-CM

## 2018-11-24 DIAGNOSIS — E782 Mixed hyperlipidemia: Secondary | ICD-10-CM

## 2018-11-24 DIAGNOSIS — B372 Candidiasis of skin and nail: Secondary | ICD-10-CM

## 2018-11-24 DIAGNOSIS — E118 Type 2 diabetes mellitus with unspecified complications: Secondary | ICD-10-CM

## 2018-11-24 LAB — POCT URINALYSIS DIP (CLINITEK)
Bilirubin, UA: NEGATIVE
Blood, UA: NEGATIVE
Glucose, UA: 500 mg/dL — AB
Ketones, POC UA: NEGATIVE mg/dL
Leukocytes, UA: NEGATIVE
Nitrite, UA: NEGATIVE
POC PROTEIN,UA: 30 — AB
Spec Grav, UA: 1.01 (ref 1.010–1.025)
Urobilinogen, UA: 1 E.U./dL
pH, UA: 6 (ref 5.0–8.0)

## 2018-11-24 LAB — POCT GLYCOSYLATED HEMOGLOBIN (HGB A1C): Hemoglobin A1C: 12 % — AB (ref 4.0–5.6)

## 2018-11-24 LAB — GLUCOSE, POCT (MANUAL RESULT ENTRY)
POC Glucose: 334 mg/dl — AB (ref 70–99)
POC Glucose: 305 mg/dl — AB (ref 70–99)

## 2018-11-24 MED ORDER — LIRAGLUTIDE 18 MG/3ML ~~LOC~~ SOPN: PEN_INJECTOR | SUBCUTANEOUS | 99 refills | Status: DC

## 2018-11-24 MED ORDER — INSULIN ASPART 100 UNIT/ML ~~LOC~~ SOLN
10.0000 [IU] | Freq: Once | SUBCUTANEOUS | Status: AC
  Administered 2018-11-24: 10 [IU] via SUBCUTANEOUS

## 2018-11-24 MED ORDER — NYSTATIN 100000 UNIT/GM EX CREA: TOPICAL_CREAM | CUTANEOUS | 1 refills | Status: DC

## 2018-11-24 MED ORDER — INSULIN PEN NEEDLE 31G X 5 MM MISC: 1 refills | Status: DC
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
## 2018-11-25 LAB — CMP14+EGFR
ALT: 11 IU/L (ref 0–44)
AST: 17 IU/L (ref 0–40)
Albumin/Globulin Ratio: 1.1 — ABNORMAL LOW (ref 1.2–2.2)
Albumin: 4 g/dL (ref 3.8–4.9)
Alkaline Phosphatase: 74 IU/L (ref 39–117)
BUN/Creatinine Ratio: 11 (ref 9–20)
BUN: 13 mg/dL (ref 6–24)
Bilirubin Total: 0.3 mg/dL (ref 0.0–1.2)
CO2: 23 mmol/L (ref 20–29)
Calcium: 9.7 mg/dL (ref 8.7–10.2)
Chloride: 95 mmol/L — ABNORMAL LOW (ref 96–106)
Creatinine, Ser: 1.18 mg/dL (ref 0.76–1.27)
GFR calc Af Amer: 82 mL/min/{1.73_m2} (ref >59)
GFR calc non Af Amer: 71 mL/min/{1.73_m2} (ref >59)
Globulin, Total: 3.7 g/dL (ref 1.5–4.5)
Glucose: 306 mg/dL — ABNORMAL HIGH (ref 65–99)
Potassium: 4.2 mmol/L (ref 3.5–5.2)
Sodium: 135 mmol/L (ref 134–144)
Total Protein: 7.7 g/dL (ref 6.0–8.5)

## 2018-11-25 LAB — LIPID PANEL
Chol/HDL Ratio: 8.7 ratio — ABNORMAL HIGH (ref 0.0–5.0)
Cholesterol, Total: 208 mg/dL — ABNORMAL HIGH (ref 100–199)
HDL: 24 mg/dL — ABNORMAL LOW (ref >39)
LDL Calculated: 131 mg/dL — ABNORMAL HIGH (ref 0–99)
Triglycerides: 266 mg/dL — ABNORMAL HIGH (ref 0–149)
VLDL Cholesterol Cal: 53 mg/dL — ABNORMAL HIGH (ref 5–40)

## 2018-11-25 LAB — CBC
Hematocrit: 40.2 % (ref 37.5–51.0)
Hemoglobin: 13 g/dL (ref 13.0–17.7)
MCH: 28.6 pg (ref 26.6–33.0)
MCHC: 32.3 g/dL (ref 31.5–35.7)
MCV: 88 fL (ref 79–97)
Platelets: 241 10*3/uL (ref 150–450)
RBC: 4.55 x10E6/uL (ref 4.14–5.80)
RDW: 11.8 % (ref 11.6–15.4)
WBC: 8.3 10*3/uL (ref 3.4–10.8)

## 2018-11-26 ENCOUNTER — Encounter: Payer: Self-pay | Admitting: Nurse Practitioner

## 2018-11-30 ENCOUNTER — Telehealth: Payer: Self-pay

## 2018-11-30 NOTE — Telephone Encounter (Signed)
-----   Message from Claiborne Rigg, NP sent at 11/26/2018  1:27 PM EDT ----- Labs do not show anemia. Cholesterol is still high and does not indicate that you are taking your cholesterol medication as prescribed. Please take your atorvastatin as prescribed to reduce the risk of heart attack, stroke or death. Kidney function has returned to normal. Liver function is normal as well.

## 2018-11-30 NOTE — Telephone Encounter (Signed)
CMA spoke to patient to inform on results.  Pt. Understood. Pt. Verified DOB.  

## 2018-12-05 ENCOUNTER — Other Ambulatory Visit: Payer: Self-pay | Admitting: Internal Medicine

## 2018-12-06 ENCOUNTER — Telehealth: Payer: Self-pay | Admitting: Family

## 2018-12-06 NOTE — Telephone Encounter (Signed)
COVID-19 Pre-Screening Questions: ° °Do you currently have a fever (>100 °F), chills or unexplained body aches? No  ° °Are you currently experiencing new cough, shortness of breath, sore throat, runny nose? No  °•  °Have you recently travelled outside the state of [place] in the last 14 days? No  °•  °1. Have you been in contact with someone that is currently pending confirmation of Covid19 testing or has been confirmed to have the Covid19 virus?  No  ° °

## 2018-12-07 ENCOUNTER — Ambulatory Visit (INDEPENDENT_AMBULATORY_CARE_PROVIDER_SITE_OTHER): Payer: Medicare HMO | Admitting: Family

## 2018-12-07 ENCOUNTER — Encounter: Payer: Self-pay | Admitting: Family

## 2018-12-07 ENCOUNTER — Other Ambulatory Visit: Payer: Self-pay

## 2018-12-07 VITALS — BP 109/71 | HR 94 | Temp 98.0°F | Ht 69.0 in | Wt 237.0 lb

## 2018-12-07 DIAGNOSIS — B20 Human immunodeficiency virus [HIV] disease: Secondary | ICD-10-CM

## 2018-12-07 DIAGNOSIS — F17210 Nicotine dependence, cigarettes, uncomplicated: Secondary | ICD-10-CM

## 2018-12-07 DIAGNOSIS — Z23 Encounter for immunization: Secondary | ICD-10-CM | POA: Diagnosis not present

## 2018-12-07 DIAGNOSIS — Z794 Long term (current) use of insulin: Secondary | ICD-10-CM

## 2018-12-07 DIAGNOSIS — N481 Balanitis: Secondary | ICD-10-CM | POA: Insufficient documentation

## 2018-12-07 DIAGNOSIS — E119 Type 2 diabetes mellitus without complications: Secondary | ICD-10-CM | POA: Diagnosis not present

## 2018-12-07 DIAGNOSIS — R69 Illness, unspecified: Secondary | ICD-10-CM | POA: Diagnosis not present

## 2018-12-07 DIAGNOSIS — N528 Other male erectile dysfunction: Secondary | ICD-10-CM

## 2018-12-07 MED ORDER — CLOTRIMAZOLE 1 % EX CREA: 1.0000 "application " | TOPICAL_CREAM | Freq: Two times a day (BID) | CUTANEOUS | 0 refills | Status: DC

## 2018-12-07 MED ORDER — SILDENAFIL CITRATE 20 MG PO TABS: ORAL_TABLET | ORAL | 0 refills | Status: DC

## 2018-12-07 MED ORDER — BICTEGRAVIR-EMTRICITAB-TENOFOV 50-200-25 MG PO TABS: 1.0000 | ORAL_TABLET | Freq: Every day | ORAL | 5 refills | Status: DC

## 2018-12-07 MED ORDER — FLUCONAZOLE 150 MG PO TABS: 150.0000 mg | ORAL_TABLET | Freq: Once | ORAL | 0 refills | Status: AC
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
## 2018-12-07 NOTE — Assessment & Plan Note (Signed)
Mr. Gumm has erectile dysfunction that is multifactorial given multiple comorbidities as well as medications.  He has poorly controlled diabetes as well as continued tobacco use.  Discussed importance of blood glucose control and how medication can also contribute to his erectile dysfunction.  Discussed treatment options and will try sildenafil.  Instructed to seek emergency medical care if erection lasting longer than 4 hours occurs.

## 2018-12-07 NOTE — Assessment & Plan Note (Signed)
Continues to smoke tobacco and working on reducing number of cigarettes per day.  Discussed importance of tobacco cessation to reduce risk for cardiovascular, respiratory, and malignant diseases in the future.  Also likely contributing factor to his erectile dysfunction.  He is in the precontemplation stage of quitting.  Continue to monitor.

## 2018-12-07 NOTE — Assessment & Plan Note (Signed)
Chris [name] has symptoms that are consistent with balanitis refractory to previously prescribed nystatin.  Likely related to increased blood sugars.  Start fluconazole 150 mg p.o. x1 dose and Chlortrimazole 1% cream twice daily.  Advised to follow-up if symptoms worsen or do not improve.

## 2018-12-07 NOTE — Patient Instructions (Addendum)
Nice to meet you.  We will check your blood work today.  Please continue to take your [place] as prescribed daily.  Start the fluconazole and clotrimazole cream for your rash.   Check out "The Diabetes Code" by Wylene Simmer  Sildenafil has been printed with the recommendation for Chris [name] that usually has a good price.  Plan for follow up in 3 months or sooner if needed with lab work same day.

## 2018-12-07 NOTE — Assessment & Plan Note (Signed)
Most recent A1c of 12.0 indicating poor control of diabetes with nutritional intake a significant contributing factor.  Discussed importance of reducing blood sugars through nutrition and physical activity.  This continues to place him at increased risk for coronary artery disease as well as renal disease in the future.  Continue current medications with changes per primary care.

## 2018-12-07 NOTE — Assessment & Plan Note (Signed)
Chris [name] has well has generally well-controlled HIV disease with good adherence and tolerance to his ART regimen of Biktarvy.  Currently has no signs/symptoms of opportunistic infection or progressive HIV disease although has several comorbidities which are poorly controlled thus increasing his risk for cardiovascular and renal disease in the future.  Check blood work today.  Continue current dose of Biktarvy.  He has no problems obtaining his medications from the pharmacy.  Plan for follow-up in 3 months or sooner if needed.

## 2018-12-08 LAB — T-HELPER CELL (CD4) - (RCID CLINIC ONLY)
CD4 % Helper T Cell: 15 % — ABNORMAL LOW (ref 33–65)
CD4 T Cell Abs: 357 /uL — ABNORMAL LOW (ref 400–1790)

## 2018-12-13 ENCOUNTER — Other Ambulatory Visit: Payer: Self-pay | Admitting: Nurse Practitioner

## 2018-12-13 ENCOUNTER — Telehealth: Payer: Self-pay | Admitting: Nurse Practitioner

## 2018-12-13 MED ORDER — ACETAMINOPHEN-CODEINE #3 300-30 MG PO TABS: 1.0000 | ORAL_TABLET | Freq: Four times a day (QID) | ORAL | 0 refills | Status: AC | PRN

## 2018-12-13 NOTE — Telephone Encounter (Signed)
1) Medication(s) Requested (by name):Tylenol # 3  2) [facility]  3) Special Requests: I did not see the medication on patient current med list.    Approved medications will be sent to the pharmacy, we will reach out if there is an issue.  Requests made after 3pm may not be addressed until the following business day!  If a patient is unsure of the name of the medication(s) please note and ask patient to call back when they are able to provide all info, do not send to responsible party until all information is available!

## 2018-12-14 LAB — HIV-1 RNA QUANT-NO REFLEX-BLD
HIV 1 RNA Quant: <20 copies/mL
HIV-1 RNA Quant, Log: <1.3 Log copies/mL

## 2018-12-15 DIAGNOSIS — J449 Chronic obstructive pulmonary disease, unspecified: Secondary | ICD-10-CM | POA: Diagnosis not present

## 2018-12-15 DIAGNOSIS — J129 Viral pneumonia, unspecified: Secondary | ICD-10-CM | POA: Diagnosis not present

## 2018-12-24 ENCOUNTER — Encounter: Payer: Self-pay | Admitting: Nurse Practitioner

## 2018-12-24 ENCOUNTER — Ambulatory Visit: Payer: Medicare HMO | Attending: Nurse Practitioner | Admitting: Nurse Practitioner

## 2018-12-24 ENCOUNTER — Other Ambulatory Visit: Payer: Self-pay

## 2018-12-24 DIAGNOSIS — Z794 Long term (current) use of insulin: Secondary | ICD-10-CM | POA: Diagnosis not present

## 2018-12-24 DIAGNOSIS — E1165 Type 2 diabetes mellitus with hyperglycemia: Secondary | ICD-10-CM

## 2018-12-24 DIAGNOSIS — R69 Illness, unspecified: Secondary | ICD-10-CM | POA: Diagnosis not present

## 2018-12-24 DIAGNOSIS — I480 Paroxysmal atrial fibrillation: Secondary | ICD-10-CM

## 2018-12-24 DIAGNOSIS — B20 Human immunodeficiency virus [HIV] disease: Secondary | ICD-10-CM

## 2018-12-24 DIAGNOSIS — I5032 Chronic diastolic (congestive) heart failure: Secondary | ICD-10-CM | POA: Diagnosis not present

## 2018-12-24 DIAGNOSIS — E118 Type 2 diabetes mellitus with unspecified complications: Secondary | ICD-10-CM

## 2018-12-24 DIAGNOSIS — E782 Mixed hyperlipidemia: Secondary | ICD-10-CM | POA: Diagnosis not present

## 2018-12-24 DIAGNOSIS — Z21 Asymptomatic human immunodeficiency virus [HIV] infection status: Secondary | ICD-10-CM

## 2018-12-24 MED ORDER — METFORMIN HCL 1000 MG PO TABS: 1000.0000 mg | ORAL_TABLET | Freq: Two times a day (BID) | ORAL | 3 refills | Status: DC
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
## 2018-12-28 ENCOUNTER — Encounter: Payer: Self-pay | Admitting: Nurse Practitioner

## 2018-12-28 MED ORDER — DILTIAZEM HCL ER COATED BEADS 240 MG PO CP24: 240.0000 mg | ORAL_CAPSULE | Freq: Every day | ORAL | 3 refills | Status: DC

## 2018-12-28 MED ORDER — LISINOPRIL 2.5 MG PO TABS: 2.5000 mg | ORAL_TABLET | Freq: Every day | ORAL | 0 refills | Status: DC

## 2019-01-04 IMAGING — US US RENAL
1 series · 14 of 25 positions shown · non-contrast
Comparison: Abdominal ultrasound August 15, 2016

CLINICAL DATA: Acute renal insufficiency

EXAM:
RENAL ULTRASOUND

[Series 1: us renal · 0.28mm/px · 14 of 27 slices shown]
[im 1/27]
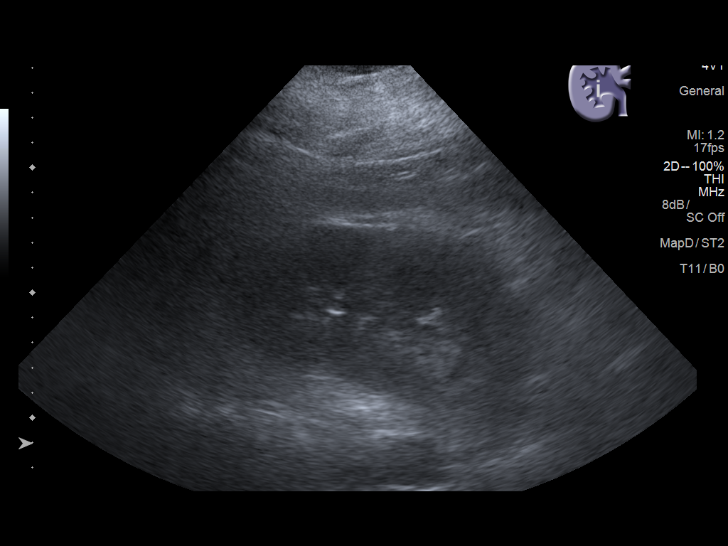
[im 3/27]
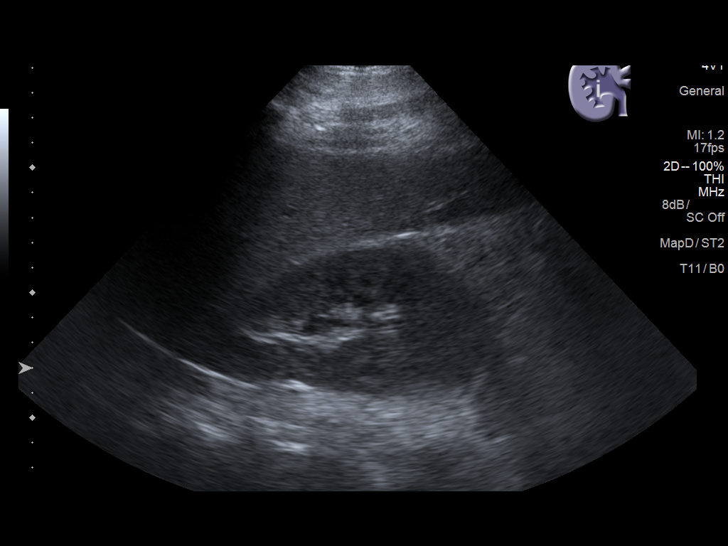
[im 5/27]
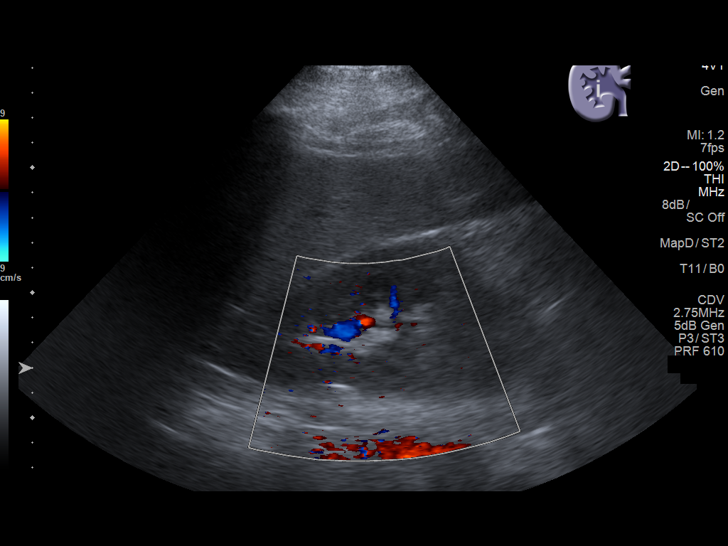
[im 7/27]
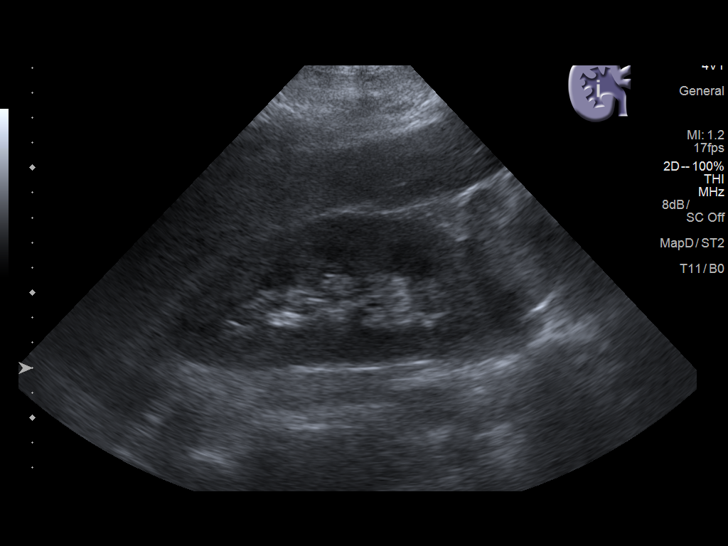
[im 9/27]
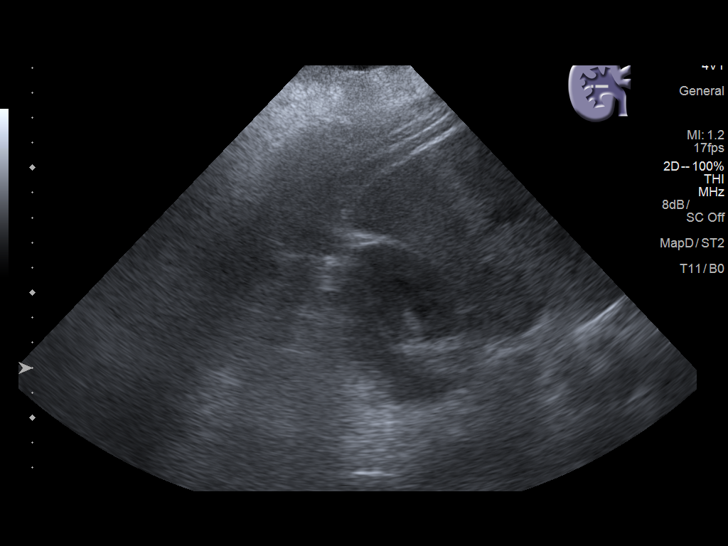
[im 10/27]
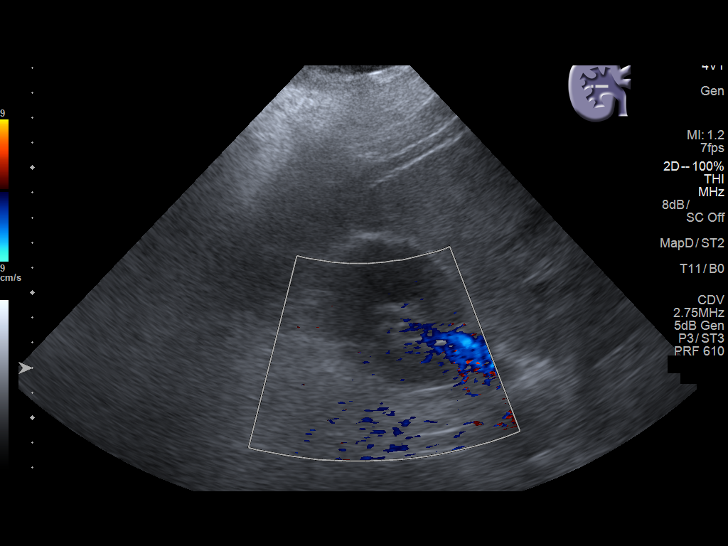
[im 12/27]
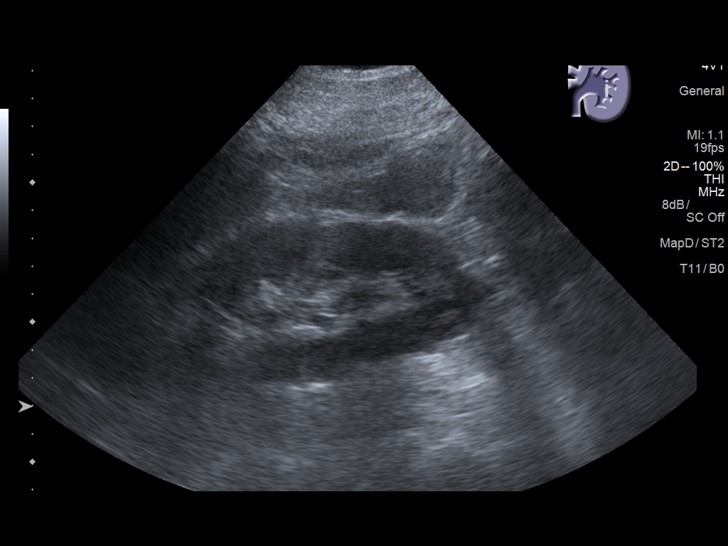
[im 15/27]
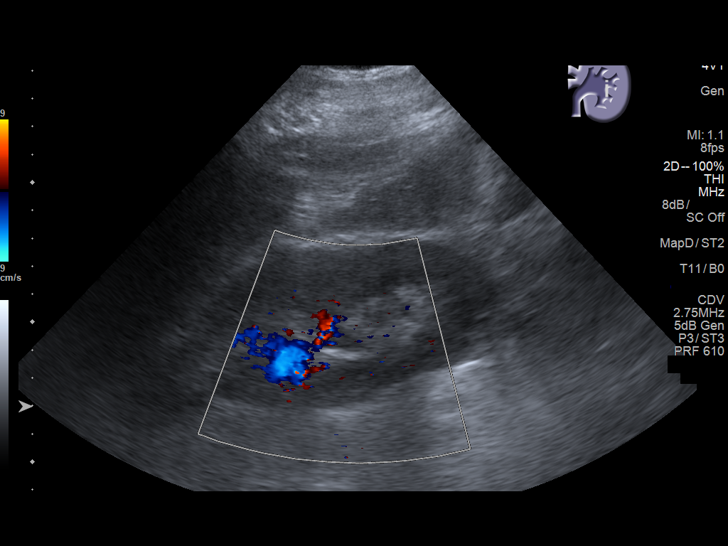
[im 17/27]
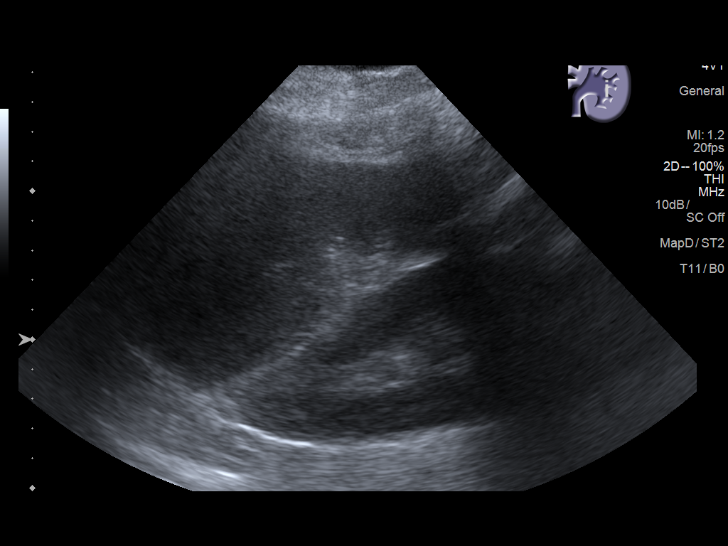
[im 18/27]
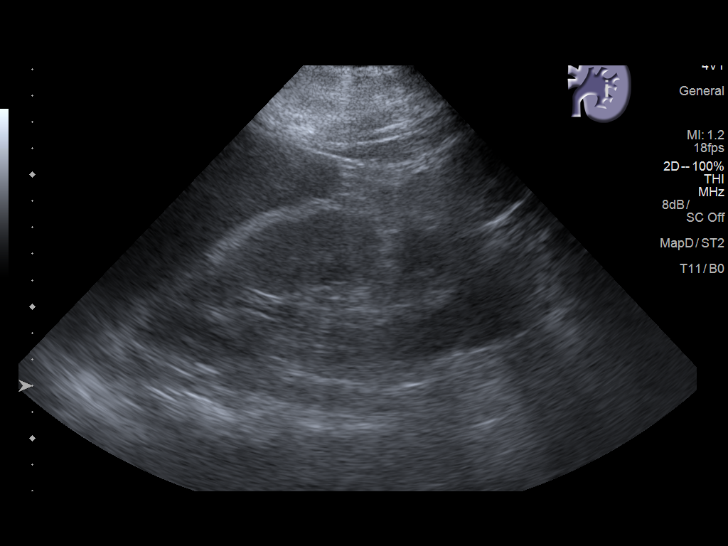
[im 20/27]
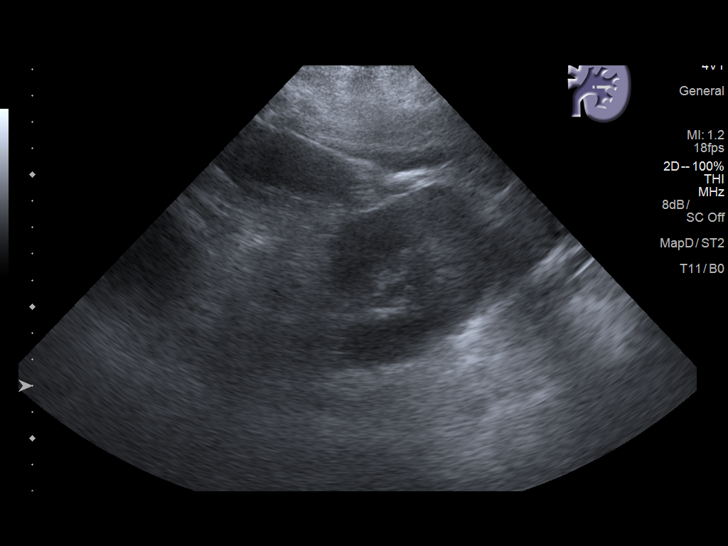
[im 22/27]
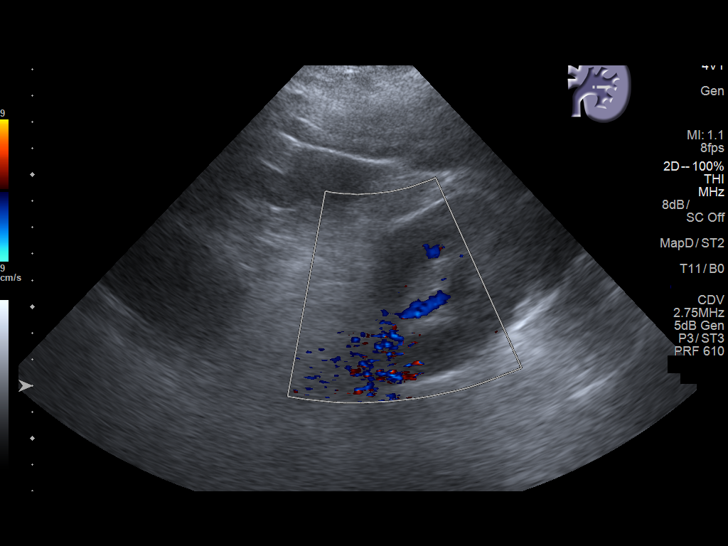
[im 24/27]
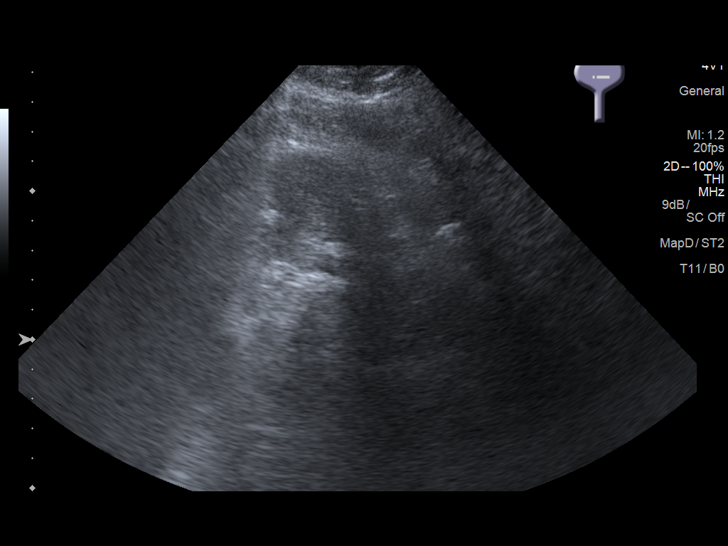
[im 27/27]
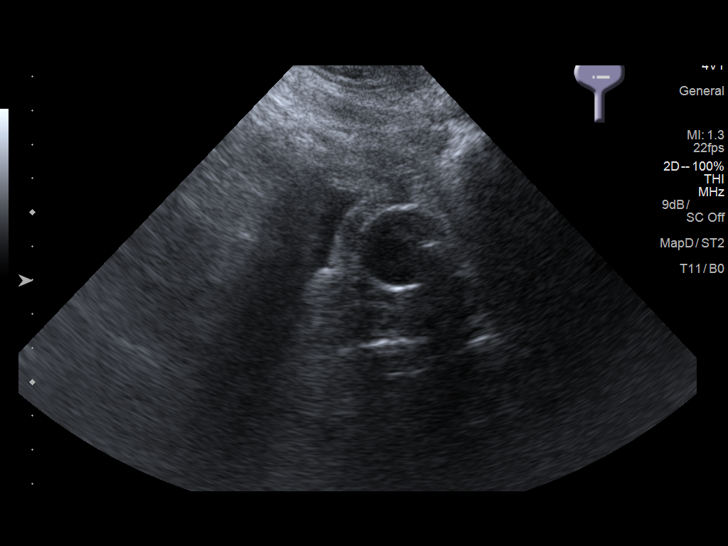

[14 of 25 positions shown; findings below may reference images not displayed]

FINDINGS: Right Kidney:

Length: 13.9 cm. Echogenicity and renal cortical thickness are
within normal limits. No mass or perinephric fluid. There is slight
fullness of the right renal collecting system. No sonographically
demonstrable calculus or ureterectasis.

Left Kidney:

Length: 13.8 cm. Echogenicity and renal cortical thickness are
within normal limits. No mass or perinephric fluid. There is slight
fullness of the left renal collecting system. No sonographically
demonstrable calculus or ureterectasis. There is slight

Bladder:

Urinary bladder is decompressed with a Foley catheter.
IMPRESSION: Slight fullness of each renal collecting system. No obstructing
focus evident on either side. Kidneys otherwise appear unremarkable
bilaterally. Urinary bladder decompressed with Foley catheter and
cannot be assessed.

## 2019-01-15 DIAGNOSIS — J129 Viral pneumonia, unspecified: Secondary | ICD-10-CM | POA: Diagnosis not present

## 2019-01-15 DIAGNOSIS — J449 Chronic obstructive pulmonary disease, unspecified: Secondary | ICD-10-CM | POA: Diagnosis not present

## 2019-01-17 ENCOUNTER — Telehealth: Payer: Self-pay | Admitting: Nurse Practitioner

## 2019-01-17 NOTE — Telephone Encounter (Signed)
1) Medication(s) Requested (by name): Tylenol #3 2) [facility] [address] 3) Special Requests:   Approved medications will be sent to the pharmacy, we will reach out if there is an issue.  Requests made after 3pm may not be addressed until the following business day!  If a patient is unsure of the name of the medication(s) please note and ask patient to call back when they are able to provide all info, do not send to responsible party until all information is available!

## 2019-01-18 ENCOUNTER — Other Ambulatory Visit: Payer: Self-pay | Admitting: Nurse Practitioner

## 2019-01-18 MED ORDER — ACETAMINOPHEN-CODEINE #3 300-30 MG PO TABS: 1.0000 | ORAL_TABLET | Freq: Three times a day (TID) | ORAL | 0 refills | Status: DC | PRN

## 2019-01-26 ENCOUNTER — Ambulatory Visit: Payer: Medicare HMO | Admitting: Podiatry

## 2019-02-02 ENCOUNTER — Encounter: Payer: Self-pay | Admitting: Podiatry

## 2019-02-02 ENCOUNTER — Other Ambulatory Visit: Payer: Self-pay

## 2019-02-02 ENCOUNTER — Ambulatory Visit (INDEPENDENT_AMBULATORY_CARE_PROVIDER_SITE_OTHER): Payer: Medicare HMO | Admitting: Podiatry

## 2019-02-02 VITALS — Temp 97.3°F

## 2019-02-02 DIAGNOSIS — M79676 Pain in unspecified toe(s): Secondary | ICD-10-CM

## 2019-02-02 DIAGNOSIS — B351 Tinea unguium: Secondary | ICD-10-CM

## 2019-02-02 DIAGNOSIS — E1142 Type 2 diabetes mellitus with diabetic polyneuropathy: Secondary | ICD-10-CM | POA: Diagnosis not present

## 2019-02-02 DIAGNOSIS — L84 Corns and callosities: Secondary | ICD-10-CM | POA: Diagnosis not present

## 2019-02-02 NOTE — Patient Instructions (Signed)
Diabetes Mellitus and Foot Care  Foot care is an important part of your health, especially when you have diabetes. Diabetes may cause you to have problems because of poor blood flow (circulation) to your feet and legs, which can cause your skin to:   Become thinner and drier.   Break more easily.   Heal more slowly.   Peel and crack.  You may also have nerve damage (neuropathy) in your legs and feet, causing decreased feeling in them. This means that you may not notice minor injuries to your feet that could lead to more serious problems. Noticing and addressing any potential problems early is the best way to prevent future foot problems.  How to care for your feet  Foot hygiene   Wash your feet daily with warm water and mild soap. Do not use hot water. Then, pat your feet and the areas between your toes until they are completely dry. Do not soak your feet as this can dry your skin.   Trim your toenails straight across. Do not dig under them or around the cuticle. File the edges of your nails with an emery board or nail file.   Apply a moisturizing lotion or petroleum jelly to the skin on your feet and to dry, brittle toenails. Use lotion that does not contain alcohol and is unscented. Do not apply lotion between your toes.  Shoes and socks   Wear clean socks or stockings every day. Make sure they are not too tight. Do not wear knee-high stockings since they may decrease blood flow to your legs.   Wear shoes that fit properly and have enough cushioning. Always look in your shoes before you put them on to be sure there are no objects inside.   To break in new shoes, wear them for just a few hours a day. This prevents injuries on your feet.  Wounds, scrapes, corns, and calluses   Check your feet daily for blisters, cuts, bruises, sores, and redness. If you cannot see the bottom of your feet, use a mirror or ask someone for help.   Do not cut corns or calluses or try to remove them with medicine.   If you  find a minor scrape, cut, or break in the skin on your feet, keep it and the skin around it clean and dry. You may clean these areas with mild soap and water. Do not clean the area with peroxide, alcohol, or iodine.   If you have a wound, scrape, corn, or callus on your foot, look at it several times a day to make sure it is healing and not infected. Check for:  ? Redness, swelling, or pain.  ? Fluid or blood.  ? Warmth.  ? Pus or a bad smell.  General instructions   Do not cross your legs. This may decrease blood flow to your feet.   Do not use heating pads or hot water bottles on your feet. They may burn your skin. If you have lost feeling in your feet or legs, you may not know this is happening until it is too late.   Protect your feet from hot and cold by wearing shoes, such as at the beach or on hot pavement.   Schedule a complete foot exam at least once a year (annually) or more often if you have foot problems. If you have foot problems, report any cuts, sores, or bruises to your health care provider immediately.  Contact a health care provider if:     You have a medical condition that increases your risk of infection and you have any cuts, sores, or bruises on your feet.   You have an injury that is not healing.   You have redness on your legs or feet.   You feel burning or tingling in your legs or feet.   You have pain or cramps in your legs and feet.   Your legs or feet are numb.   Your feet always feel cold.   You have pain around a toenail.  Get help right away if:   You have a wound, scrape, corn, or callus on your foot and:  ? You have pain, swelling, or redness that gets worse.  ? You have fluid or blood coming from the wound, scrape, corn, or callus.  ? Your wound, scrape, corn, or callus feels warm to the touch.  ? You have pus or a bad smell coming from the wound, scrape, corn, or callus.  ? You have a fever.  ? You have a red line going up your leg.  Summary   Check your feet every day  for cuts, sores, red spots, swelling, and blisters.   Moisturize feet and legs daily.   Wear shoes that fit properly and have enough cushioning.   If you have foot problems, report any cuts, sores, or bruises to your health care provider immediately.   Schedule a complete foot exam at least once a year (annually) or more often if you have foot problems.  This information is not intended to replace advice given to you by your health care provider. Make sure you discuss any questions you have with your health care provider.  Document Released: 07/25/2000 Document Revised: 09/09/2017 Document Reviewed: 08/29/2016  Elsevier Interactive Patient Education  2019 [employer]

## 2019-02-03 ENCOUNTER — Other Ambulatory Visit: Payer: Self-pay | Admitting: Family Medicine

## 2019-02-12 NOTE — Progress Notes (Signed)
Subjective: Chris [name]. presents today with history of neuropathy. Patient seen for follow up of chronic, painful mycotic toenails and calluses which interfere with daily activities and routine tasks.  Pain is aggravated when wearing enclosed shoe gear. Pain is getting progressively worse and relieved with periodic professional debridement.   [name], NP is his PCP and last visit was 12/24/2018.   Current Outpatient Medications:  .  acetaminophen-codeine (TYLENOL #3) 300-30 MG tablet, Take 1-2 tablets by mouth every 8 (eight) hours as needed for moderate pain or severe pain., Disp: 60 tablet, Rfl: 0 .  albuterol (PROVENTIL HFA;VENTOLIN HFA) 108 (90 Base) MCG/ACT inhaler, Inhale 2 puffs into the lungs every 6 (six) hours as needed for wheezing or shortness of breath., Disp: 1 Inhaler, Rfl: 5 .  apixaban (ELIQUIS) 5 MG TABS tablet, Take 1 tablet (5 mg total) by mouth 2 (two) times daily., Disp: 180 tablet, Rfl: 3 .  baclofen (LIORESAL) 10 MG tablet, Take 10 mg by mouth daily. , Disp: , Rfl:  .  bictegravir-emtricitabine-tenofovir AF (BIKTARVY) 50-200-25 MG TABS tablet, Take 1 tablet by mouth daily., Disp: 30 tablet, Rfl: 5 .  Blood Glucose Monitoring Suppl (ONETOUCH VERIO) w/Device KIT, 1 kit by Does not apply route 2 (two) times daily., Disp: 1 kit, Rfl: 0 .  clotrimazole (LOTRIMIN) 1 % cream, Apply 1 application topically 2 (two) times daily., Disp: 30 g, Rfl: 0 .  diltiazem (CARDIZEM CD) 240 MG 24 hr capsule, Take 1 capsule (240 mg total) by mouth daily., Disp: 90 capsule, Rfl: 3 .  fluconazole (DIFLUCAN) 150 MG tablet, , Disp: , Rfl:  .  gabapentin (NEURONTIN) 100 MG capsule, Take 1 capsule by mouth daily., Disp: , Rfl:  .  glucose blood (ONETOUCH VERIO) test strip, Use as instructed, Disp: 100 each, Rfl: 12 .  Glycopyrrolate-Formoterol (BEVESPI AEROSPHERE) 9-4.8 MCG/ACT AERO, Inhale 2 puffs into the lungs 2 (two) times daily., Disp: 1 Inhaler, Rfl: 0 .  Insulin Pen Needle (B-D UF  III MINI PEN NEEDLES) 31G X 5 MM MISC, Inject into the skin twice per day., Disp: 100 each, Rfl: 1 .  liraglutide (VICTOZA) 18 MG/3ML SOPN, SubQ:  Inject 0.6 mg once daily into the skin. Week 2: increase to 1.2 mg once daily; week 3: increase to 1.8 mg once daily, Disp: 15 mL, Rfl: prn .  lisinopril (ZESTRIL) 2.5 MG tablet, Take 1 tablet (2.5 mg total) by mouth daily., Disp: 90 tablet, Rfl: 0 .  ONETOUCH DELICA LANCETS 51G MISC, Use as instructed, Disp: 100 each, Rfl: 3 .  OXYGEN, Inhale 2 L into the lungs as needed (shortness of breath). Uses every 2 days, Disp: , Rfl:  .  sildenafil (REVATIO) 20 MG tablet, Take 1-5 tablets by mouth daily as needed 30 minutes prior to sex. Do not exceed 5 per day., Disp: 20 tablet, Rfl: 0 .  atorvastatin (LIPITOR) 40 MG tablet, TAKE 1 TABLET BY MOUTH EVERY DAY, Disp: 90 tablet, Rfl: 0 .  DULoxetine (CYMBALTA) 30 MG capsule, Take 1 capsule (30 mg total) by mouth daily for 30 days., Disp: 90 capsule, Rfl: 1 .  hydrALAZINE (APRESOLINE) 50 MG tablet, Take 1.5 tablets (75 mg total) by mouth every 8 (eight) hours., Disp: 405 tablet, Rfl: 1 .  metFORMIN (GLUCOPHAGE) 1000 MG tablet, Take 1 tablet (1,000 mg total) by mouth 2 (two) times daily with a meal for 30 days., Disp: 60 tablet, Rfl: 3  Allergies  Allergen Reactions  . Bactrim [Sulfamethoxazole-Trimethoprim] Hives  Objective: Vitals:   02/02/19 1450  Temp: (!) 97.3 F (36.3 C)    Vascular Examination: Capillary refill time immediate x 10 digits.  Dorsalis pedis pulses present b/l.  Posterior tibial pulses present b/l.  No digital hair x 10 digits.  Skin temperature WNL b/l.  Dermatological Examination: Skin with normal turgor, texture and tone b/l.  Toenails 1-5 b/l discolored, thick, dystrophic with subungual debris and pain with palpation to nailbeds due to thickness of nails.  Hyperkeratotic lesion(s) b/l hallux and submet head 4 left foot. No erythema, no edema, no drainage, no flocculence  noted.   Musculoskeletal: Muscle strength 5/5 to all LE muscle groups.  Mild hammertoe deformity b/l 4th and 5th digits.  Neurological: Sensation intact with 10 gram monofilament.  Vibratory sensation diminished b/l.  Assessment: 1. Painful onychomycosis toenails 1-5 b/l 2. Calluses b/l hallux and submet head 4 left foot 3. NIDDM with neuropathy  Plan: 1. Toenails 1-5 b/l were debrided in length and girth without iatrogenic bleeding. 2. Calluses pared submetatarsal head(s) 4 left foot and b/l hallux utilizing sterile scalpel blade without incident. 3. Patient to continue soft, supportive shoe gear daily. 4. Patient to report any pedal injuries to medical professional immediately. 5. Follow up 3 months.  6. Patient/POA to call should there be a concern in the interim.

## 2019-02-14 DIAGNOSIS — J449 Chronic obstructive pulmonary disease, unspecified: Secondary | ICD-10-CM | POA: Diagnosis not present

## 2019-02-14 DIAGNOSIS — J129 Viral pneumonia, unspecified: Secondary | ICD-10-CM | POA: Diagnosis not present

## 2019-02-17 ENCOUNTER — Telehealth: Payer: Self-pay | Admitting: Nurse Practitioner

## 2019-02-17 NOTE — Telephone Encounter (Signed)
Pt called to request a refill for acetaminophen-codeine (TYLENOL #3) 300-30 MG tablet And to [facility] sent [facility] to [id number] [place] [place] thanks

## 2019-02-18 ENCOUNTER — Other Ambulatory Visit: Payer: Self-pay | Admitting: Nurse Practitioner
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
## 2019-02-28 ENCOUNTER — Other Ambulatory Visit: Payer: Self-pay

## 2019-02-28 ENCOUNTER — Encounter: Payer: Self-pay | Admitting: Adult Health

## 2019-02-28 ENCOUNTER — Ambulatory Visit (INDEPENDENT_AMBULATORY_CARE_PROVIDER_SITE_OTHER): Payer: Medicare HMO | Admitting: Adult Health

## 2019-02-28 VITALS — BP 116/73 | HR 85 | Ht 69.0 in | Wt 254.6 lb

## 2019-02-28 DIAGNOSIS — J449 Chronic obstructive pulmonary disease, unspecified: Secondary | ICD-10-CM | POA: Diagnosis not present

## 2019-02-28 DIAGNOSIS — I10 Essential (primary) hypertension: Secondary | ICD-10-CM | POA: Diagnosis not present

## 2019-02-28 DIAGNOSIS — E1169 Type 2 diabetes mellitus with other specified complication: Secondary | ICD-10-CM | POA: Diagnosis not present

## 2019-02-28 DIAGNOSIS — I480 Paroxysmal atrial fibrillation: Secondary | ICD-10-CM | POA: Diagnosis not present

## 2019-02-28 DIAGNOSIS — Z8679 Personal history of other diseases of the circulatory system: Secondary | ICD-10-CM

## 2019-02-28 DIAGNOSIS — H353132 Nonexudative age-related macular degeneration, bilateral, intermediate dry stage: Secondary | ICD-10-CM | POA: Diagnosis not present

## 2019-02-28 IMAGING — DX DG CHEST 2V
2 series · 2 of 2 positions shown · non-contrast
Comparison: Radiographs February 09, 2018.

CLINICAL DATA: Pneumonia.

EXAM:
CHEST - 2 VIEW

[chest pa]
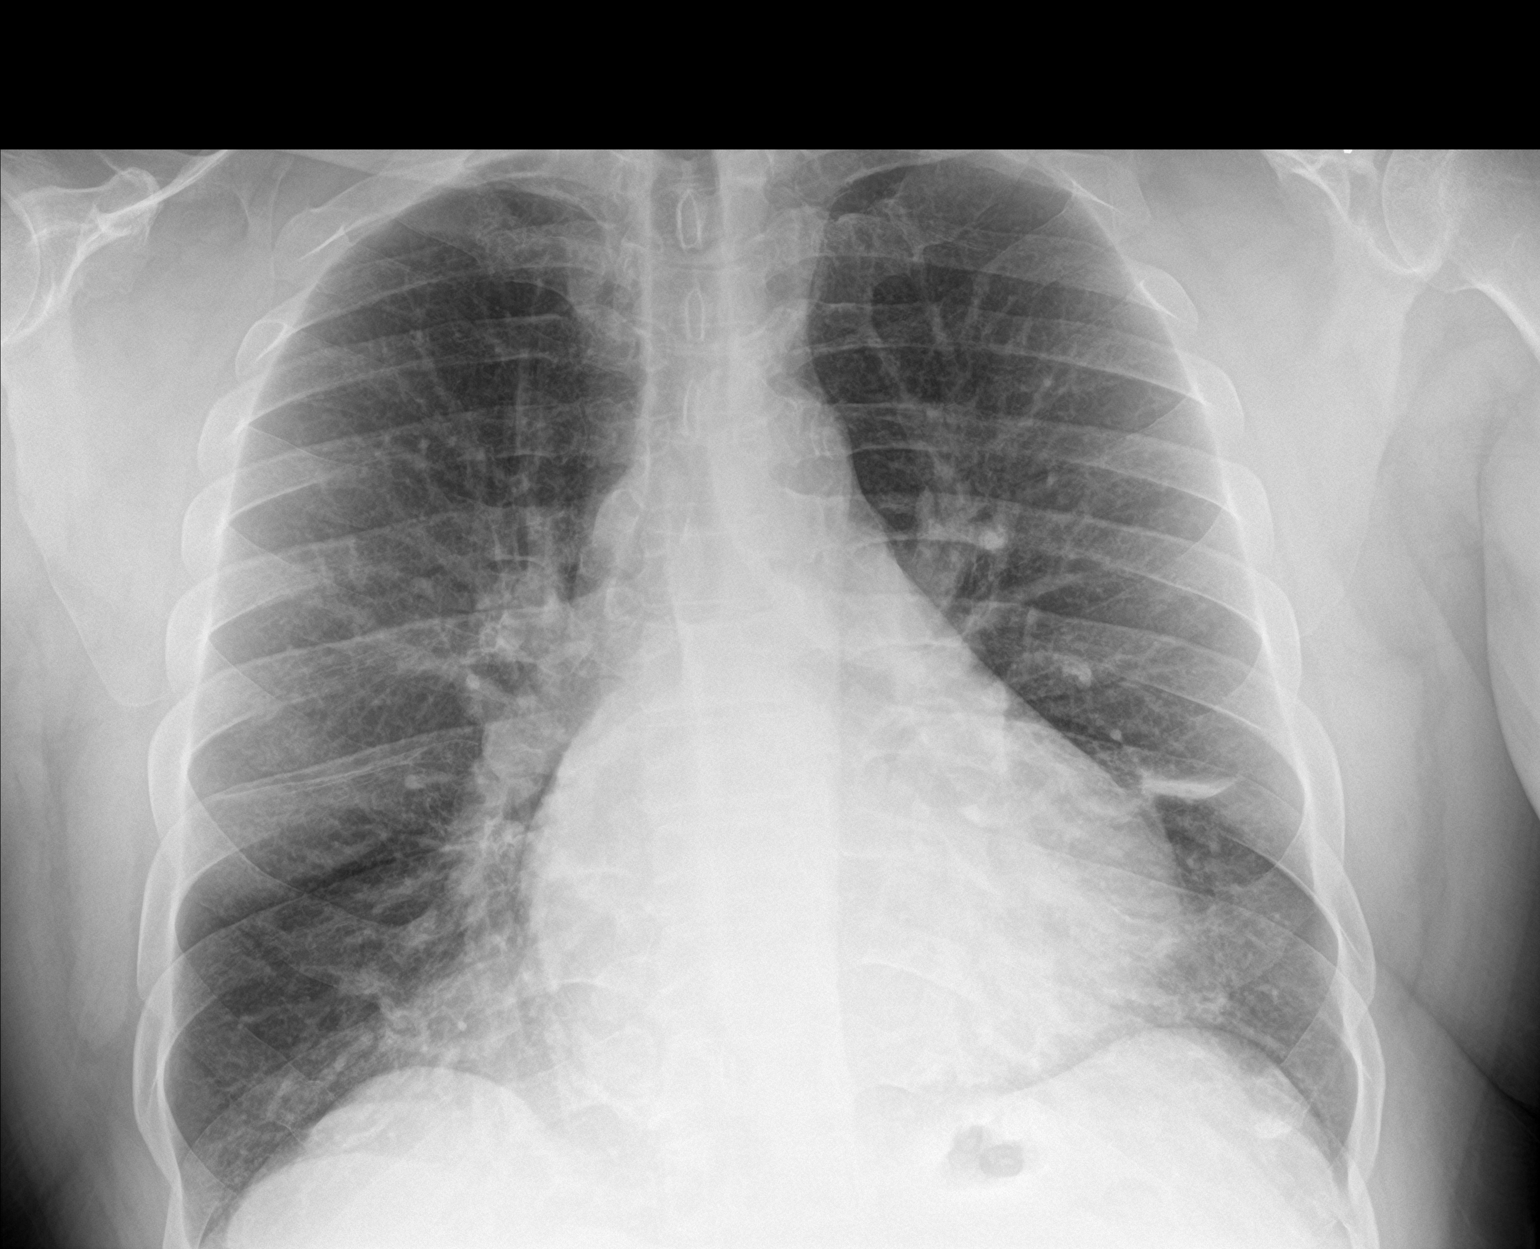

[chest lat]
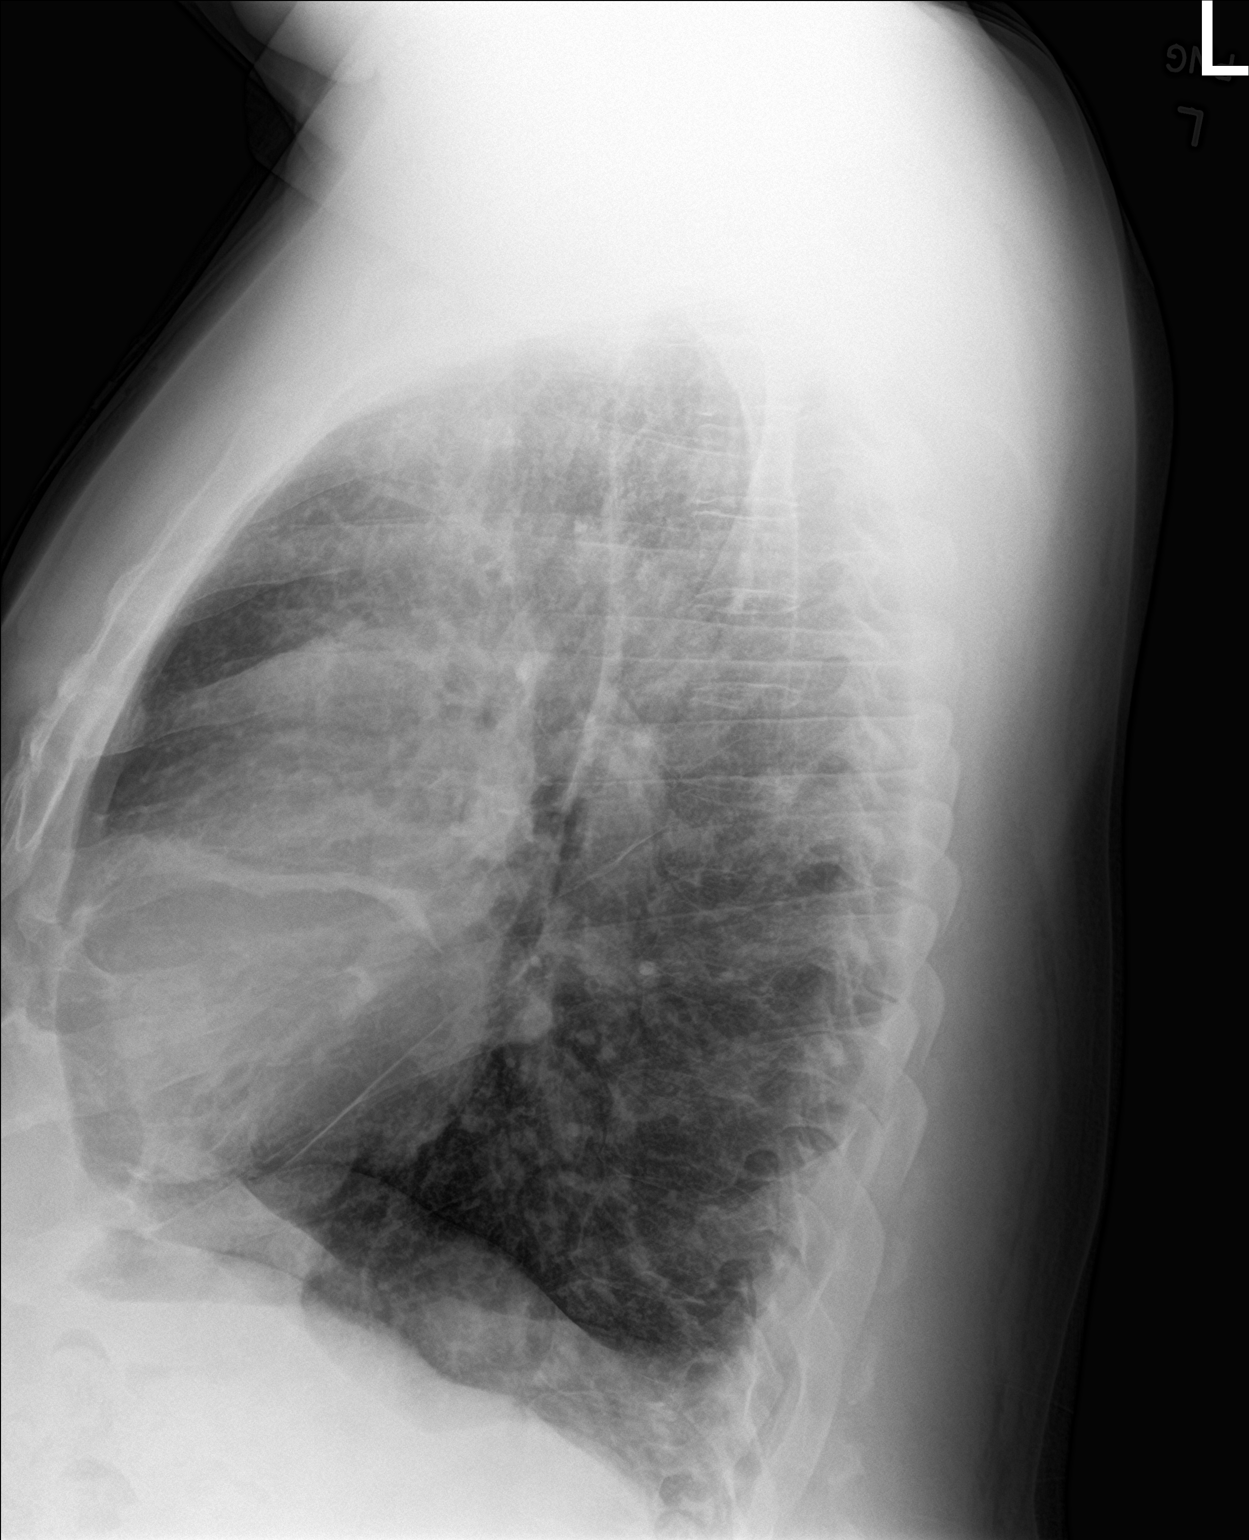

[2 of 2 positions shown; findings below may reference images not displayed]

FINDINGS: Stable cardiomediastinal silhouette. No pneumothorax or pleural
effusion is noted. Right lung is clear. Lingular linear density is
noted which is decreased compared to prior exam and may represent
residual atelectasis or scarring. The visualized skeletal structures
are unremarkable.
IMPRESSION: Lingular linear density is noted which may represent residual
atelectasis or scarring.

## 2019-02-28 MED ORDER — LISINOPRIL 2.5 MG PO TABS: 2.5000 mg | ORAL_TABLET | Freq: Every day | ORAL | 3 refills | Status: DC

## 2019-02-28 MED ORDER — ATORVASTATIN CALCIUM 40 MG PO TABS: 40.0000 mg | ORAL_TABLET | Freq: Every day | ORAL | 3 refills | Status: DC

## 2019-02-28 MED ORDER — DILTIAZEM HCL ER COATED BEADS 240 MG PO CP24: 240.0000 mg | ORAL_CAPSULE | Freq: Every day | ORAL | 3 refills | Status: DC

## 2019-02-28 MED ORDER — APIXABAN 5 MG PO TABS: 5.0000 mg | ORAL_TABLET | Freq: Two times a day (BID) | ORAL | 3 refills | Status: DC

## 2019-02-28 NOTE — Patient Instructions (Signed)
Medication Instructions:  Continue current medications  If you need a refill on your cardiac medications before your next appointment, please call your pharmacy.  Labwork: None Ordered   Testing/Procedures: None Ordered   Follow-Up: You will need a follow up appointment in 6 months.  Please call our office 2 months in advance to schedule this appointment.  You may see David Harding, MD or one of the following Advanced Practice Providers on your designated Care Team:   Rhonda Barrett, PA-C . Kathryn Lawrence, DNP, ANP     At [facility], you and your health needs are our priority.  As part of our continuing mission to provide you with exceptional heart care, we have created designated Provider Care Teams.  These Care Teams include your primary Cardiologist (physician) and Advanced Practice Providers (APPs -  Physician Assistants and Nurse Practitioners) who all work together to provide you with the care you need, when you need it.  Thank you for choosing [facility] at [facility]!!     

## 2019-03-01 ENCOUNTER — Encounter: Payer: Self-pay | Admitting: Nurse Practitioner

## 2019-03-01 ENCOUNTER — Ambulatory Visit: Payer: Medicare HMO | Attending: Nurse Practitioner | Admitting: Nurse Practitioner

## 2019-03-01 DIAGNOSIS — E1165 Type 2 diabetes mellitus with hyperglycemia: Secondary | ICD-10-CM | POA: Diagnosis not present

## 2019-03-01 DIAGNOSIS — E782 Mixed hyperlipidemia: Secondary | ICD-10-CM

## 2019-03-01 DIAGNOSIS — J441 Chronic obstructive pulmonary disease with (acute) exacerbation: Secondary | ICD-10-CM

## 2019-03-01 DIAGNOSIS — I10 Essential (primary) hypertension: Secondary | ICD-10-CM | POA: Diagnosis not present

## 2019-03-01 DIAGNOSIS — E1142 Type 2 diabetes mellitus with diabetic polyneuropathy: Secondary | ICD-10-CM | POA: Diagnosis not present

## 2019-03-01 MED ORDER — PREDNISONE 50 MG PO TABS: 50.0000 mg | ORAL_TABLET | Freq: Every day | ORAL | 0 refills | Status: AC
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
## 2019-03-02 ENCOUNTER — Ambulatory Visit: Payer: Medicare HMO | Attending: Family Medicine

## 2019-03-02 ENCOUNTER — Other Ambulatory Visit: Payer: Self-pay

## 2019-03-02 DIAGNOSIS — E782 Mixed hyperlipidemia: Secondary | ICD-10-CM | POA: Diagnosis not present

## 2019-03-02 DIAGNOSIS — E1142 Type 2 diabetes mellitus with diabetic polyneuropathy: Secondary | ICD-10-CM

## 2019-03-02 DIAGNOSIS — E1165 Type 2 diabetes mellitus with hyperglycemia: Secondary | ICD-10-CM | POA: Diagnosis not present

## 2019-03-03 ENCOUNTER — Encounter: Payer: Self-pay | Admitting: Nurse Practitioner

## 2019-03-03 LAB — LIPID PANEL
Chol/HDL Ratio: 7.1 ratio — ABNORMAL HIGH (ref 0.0–5.0)
Cholesterol, Total: 228 mg/dL — ABNORMAL HIGH (ref 100–199)
HDL: 32 mg/dL — ABNORMAL LOW (ref >39)
LDL Calculated: 157 mg/dL — ABNORMAL HIGH (ref 0–99)
Triglycerides: 197 mg/dL — ABNORMAL HIGH (ref 0–149)
VLDL Cholesterol Cal: 39 mg/dL (ref 5–40)

## 2019-03-03 LAB — CMP14+EGFR
ALT: 19 IU/L (ref 0–44)
AST: 16 IU/L (ref 0–40)
Albumin/Globulin Ratio: 1.1 — ABNORMAL LOW (ref 1.2–2.2)
Albumin: 4.3 g/dL (ref 3.8–4.9)
Alkaline Phosphatase: 52 IU/L (ref 39–117)
BUN/Creatinine Ratio: 14 (ref 9–20)
BUN: 18 mg/dL (ref 6–24)
Bilirubin Total: 0.3 mg/dL (ref 0.0–1.2)
CO2: 21 mmol/L (ref 20–29)
Calcium: 9.5 mg/dL (ref 8.7–10.2)
Chloride: 100 mmol/L (ref 96–106)
Creatinine, Ser: 1.3 mg/dL — ABNORMAL HIGH (ref 0.76–1.27)
GFR calc Af Amer: 73 mL/min/{1.73_m2} (ref >59)
GFR calc non Af Amer: 63 mL/min/{1.73_m2} (ref >59)
Globulin, Total: 3.8 g/dL (ref 1.5–4.5)
Glucose: 145 mg/dL — ABNORMAL HIGH (ref 65–99)
Potassium: 4.4 mmol/L (ref 3.5–5.2)
Sodium: 139 mmol/L (ref 134–144)
Total Protein: 8.1 g/dL (ref 6.0–8.5)

## 2019-03-03 LAB — MICROALBUMIN / CREATININE URINE RATIO
Creatinine, Urine: 121.7 mg/dL
Microalb/Creat Ratio: 48 mg/g creat — ABNORMAL HIGH (ref 0–29)
Microalbumin, Urine: 58.4 ug/mL

## 2019-03-03 LAB — HEMOGLOBIN A1C
Est. average glucose Bld gHb Est-mCnc: 134 mg/dL
Hgb A1c MFr Bld: 6.3 % — ABNORMAL HIGH (ref 4.8–5.6)

## 2019-03-03 NOTE — Progress Notes (Signed)
CMA spoke to patient to inform on results and PCP advising.   Pt. Stated he is taking his Lisinopril. PT. Understood and no question/concerns.

## 2019-03-09 ENCOUNTER — Other Ambulatory Visit: Payer: Self-pay | Admitting: Nurse Practitioner

## 2019-03-09 DIAGNOSIS — F4321 Adjustment disorder with depressed mood: Secondary | ICD-10-CM

## 2019-03-10 ENCOUNTER — Telehealth: Payer: Self-pay | Admitting: Family

## 2019-03-10 NOTE — Telephone Encounter (Signed)
COVID-19 Pre-Screening Questions:03/10/19 ° ° °Do you currently have a fever (>100 °F), chills or unexplained body aches? NO  ° °Are you currently experiencing new cough, shortness of breath, sore throat, runny nose? NO °•  °Have you recently travelled outside the state of  in the last 14 days? NO °•  °Have you been in contact with someone that is currently pending confirmation of Covid19 testing or has been confirmed to have the Covid19 virus?  NO ° °**If the patient answers NO to ALL questions -  advise the patient to please call the clinic before coming to the office should any symptoms develop.  ° ° ° °
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
## 2019-03-14 ENCOUNTER — Ambulatory Visit: Payer: Medicare HMO | Admitting: Family

## 2019-03-17 DIAGNOSIS — J449 Chronic obstructive pulmonary disease, unspecified: Secondary | ICD-10-CM | POA: Diagnosis not present

## 2019-03-17 DIAGNOSIS — J129 Viral pneumonia, unspecified: Secondary | ICD-10-CM | POA: Diagnosis not present

## 2019-03-18 ENCOUNTER — Ambulatory Visit (HOSPITAL_COMMUNITY)
Admission: EM | Admit: 2019-03-18 | Discharge: 2019-03-18 | Disposition: A | Discharge status: Home / Self Care | Payer: Medicare HMO | Attending: Family Medicine | Admitting: Family Medicine

## 2019-03-18 ENCOUNTER — Other Ambulatory Visit: Payer: Self-pay

## 2019-03-18 ENCOUNTER — Encounter (HOSPITAL_COMMUNITY): Payer: Self-pay

## 2019-03-18 DIAGNOSIS — M79645 Pain in left finger(s): Secondary | ICD-10-CM

## 2019-03-18 DIAGNOSIS — L03012 Cellulitis of left finger: Secondary | ICD-10-CM | POA: Diagnosis not present

## 2019-03-18 MED ORDER — DOXYCYCLINE HYCLATE 100 MG PO CAPS: 100.0000 mg | ORAL_CAPSULE | Freq: Two times a day (BID) | ORAL | 0 refills | Status: DC

## 2019-03-18 NOTE — Discharge Instructions (Signed)
Keep this dressing in place for a few hours today, then start with soaks of the finger.  3-4 times a day soak the finger to promote further drainage.  Tylenol, elevation, and ice to help with pain.  Complete course of antibiotics.  If any worsening of symptoms, firmness to pad of finger, increased pain, fevers or otherwise worsening please return to be seen.

## 2019-03-18 NOTE — ED Triage Notes (Signed)
Patient presents to Urgent Care with complaints of left pinky swelling and pain since 3-4 days ago. Patient reports he pullled a hang nail and the finger began swelling after that.
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
## 2019-03-21 ENCOUNTER — Ambulatory Visit: Payer: Medicare HMO | Admitting: Family

## 2019-03-24 ENCOUNTER — Ambulatory Visit (INDEPENDENT_AMBULATORY_CARE_PROVIDER_SITE_OTHER): Payer: Medicare HMO | Admitting: Family

## 2019-03-24 ENCOUNTER — Other Ambulatory Visit: Payer: Self-pay

## 2019-03-24 ENCOUNTER — Encounter: Payer: Self-pay | Admitting: Family

## 2019-03-24 VITALS — BP 144/81 | HR 90

## 2019-03-24 DIAGNOSIS — E1169 Type 2 diabetes mellitus with other specified complication: Secondary | ICD-10-CM | POA: Diagnosis not present

## 2019-03-24 DIAGNOSIS — N521 Erectile dysfunction due to diseases classified elsewhere: Secondary | ICD-10-CM

## 2019-03-24 DIAGNOSIS — B20 Human immunodeficiency virus [HIV] disease: Secondary | ICD-10-CM | POA: Diagnosis not present

## 2019-03-24 DIAGNOSIS — E669 Obesity, unspecified: Secondary | ICD-10-CM | POA: Diagnosis not present

## 2019-03-24 DIAGNOSIS — Z113 Encounter for screening for infections with a predominantly sexual mode of transmission: Secondary | ICD-10-CM | POA: Diagnosis not present

## 2019-03-24 DIAGNOSIS — J449 Chronic obstructive pulmonary disease, unspecified: Secondary | ICD-10-CM | POA: Diagnosis not present

## 2019-03-24 DIAGNOSIS — R69 Illness, unspecified: Secondary | ICD-10-CM | POA: Diagnosis not present

## 2019-03-24 MED ORDER — SILDENAFIL CITRATE 50 MG PO TABS: 50.0000 mg | ORAL_TABLET | Freq: Every day | ORAL | 0 refills | Status: DC | PRN

## 2019-03-24 MED ORDER — BIKTARVY 50-200-25 MG PO TABS: 1.0000 | ORAL_TABLET | Freq: Every day | ORAL | 5 refills | Status: DC

## 2019-03-24 NOTE — Assessment & Plan Note (Signed)
Stable with no signs of exacerbation today.  Currently on 2 L of oxygen via nasal cannula.  He does continue to smoke.  Encouraged tobacco cessation to reduce risk for progression of disease and worsening of respiratory status.  Continue management per pulmonology.

## 2019-03-24 NOTE — Patient Instructions (Signed)
Nice to see you!  We will check your blood work today.  Please continue to take your [place] daily.  Refills have been sent to the pharmacy.  Recommend influenza vaccination in September  Plan for follow up in 4 months or sooner if needed with lab work on the same day!  Have a great day and stay safe!
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
## 2019-03-24 NOTE — Assessment & Plan Note (Signed)
Type 2 diabetes with most recent A1c of 6.0.  Continue management per primary care.

## 2019-03-24 NOTE — Assessment & Plan Note (Signed)
Chris [name] has erectile dysfunction likely associated with multiple comorbid conditions as well as medications.  Discussed treatment options and he wishes to try Viagra.  Not currently on nitrates for chest pain.  Advised to seek assistance if he has erection lasting longer than 4 hours as that constitutes a medical emergency.  Continue to monitor.

## 2019-03-24 NOTE — Assessment & Plan Note (Signed)
Mr. Yarbrough continues to have well-controlled HIV disease with good adherence and tolerance to his ART regimen of Biktarvy.  No signs/symptoms of opportunistic infection or progressive HIV disease.  He has no problems obtaining his medication from the pharmacy.  Check blood work today.  Continue current dose of Biktarvy.  Plan for follow-up in 4 months or sooner if needed with lab work on the same day.

## 2019-03-25 ENCOUNTER — Other Ambulatory Visit: Payer: Self-pay | Admitting: Nurse Practitioner

## 2019-03-25 LAB — T-HELPER CELL (CD4) - (RCID CLINIC ONLY)
CD4 % Helper T Cell: 16 % — ABNORMAL LOW (ref 33–65)
CD4 T Cell Abs: 427 /uL (ref 400–1790)

## 2019-03-25 NOTE — Telephone Encounter (Signed)
CMA left a VM for patient to inform he have Lisinopril refills, and need to contact his pharmacy.

## 2019-03-25 NOTE — Telephone Encounter (Signed)
New Message   1) Medication(s) Requested (by name):  lisinopril (ZESTRIL) 2.5 MG tablet and acetaminophen-codeine (TYLENOL #3) 300-30 MG tablet  2) [facility] [address]  3) Special Requests:   Approved medications will be sent to the pharmacy, we will reach out if there is an issue.  Requests made after 3pm may not be addressed until the following business day!  If a patient is unsure of the name of the medication(s) please note and ask patient to call back when they are able to provide all info, do not send to responsible party until all information is available!

## 2019-03-25 NOTE — Telephone Encounter (Signed)
Patient with refills of lisinopril at his pharmacy. He received #60 of Tylenol #3 on 02/21/19 with instructions to take 1 to 2 pills every 8 hours (10 day supply). Will defer to PCP for refills.

## 2019-03-29 LAB — RPR: RPR Ser Ql: NONREACTIVE

## 2019-03-29 LAB — HIV-1 RNA QUANT-NO REFLEX-BLD
HIV 1 RNA Quant: <20 copies/mL — AB
HIV-1 RNA Quant, Log: <1.3 Log copies/mL — AB

## 2019-03-30 MED ORDER — ACETAMINOPHEN-CODEINE #3 300-30 MG PO TABS: 1.0000 | ORAL_TABLET | Freq: Three times a day (TID) | ORAL | 0 refills | Status: AC | PRN

## 2019-03-31 ENCOUNTER — Telehealth: Payer: Self-pay | Admitting: *Deleted

## 2019-03-31 NOTE — Telephone Encounter (Signed)
-----   Message from [place], [place] sent at 03/30/2019 12:03 PM EDT ----- Please inform Mr. Bonanno that his viral load is undetectable and CD4 count is 427. Continue to take Biktarvy as prescribed and follow up in 4 months as planned.

## 2019-03-31 NOTE — Telephone Encounter (Signed)
Per Marya Amsler called the patient to let him know about his recent labs and had to leave a message for him to call the office.

## 2019-04-17 DIAGNOSIS — J449 Chronic obstructive pulmonary disease, unspecified: Secondary | ICD-10-CM | POA: Diagnosis not present

## 2019-04-17 DIAGNOSIS — J129 Viral pneumonia, unspecified: Secondary | ICD-10-CM | POA: Diagnosis not present

## 2019-05-04 ENCOUNTER — Other Ambulatory Visit: Payer: Self-pay | Admitting: Nurse Practitioner

## 2019-05-04 ENCOUNTER — Ambulatory Visit (INDEPENDENT_AMBULATORY_CARE_PROVIDER_SITE_OTHER): Payer: Medicare HMO | Admitting: Podiatry

## 2019-05-04 ENCOUNTER — Other Ambulatory Visit: Payer: Self-pay

## 2019-05-04 ENCOUNTER — Telehealth: Payer: Self-pay | Admitting: Nurse Practitioner

## 2019-05-04 ENCOUNTER — Encounter: Payer: Self-pay | Admitting: Podiatry

## 2019-05-04 DIAGNOSIS — L84 Corns and callosities: Secondary | ICD-10-CM

## 2019-05-04 DIAGNOSIS — E1142 Type 2 diabetes mellitus with diabetic polyneuropathy: Secondary | ICD-10-CM | POA: Diagnosis not present

## 2019-05-04 DIAGNOSIS — M79676 Pain in unspecified toe(s): Secondary | ICD-10-CM

## 2019-05-04 DIAGNOSIS — B351 Tinea unguium: Secondary | ICD-10-CM | POA: Diagnosis not present

## 2019-05-04 MED ORDER — ACETAMINOPHEN-CODEINE #3 300-30 MG PO TABS: 1.0000 | ORAL_TABLET | Freq: Three times a day (TID) | ORAL | 0 refills | Status: DC | PRN

## 2019-05-04 NOTE — Telephone Encounter (Signed)
New Message   1) Medication(s) Requested (by name): Tylenol 3  2) Pharmacy [facility]: [facility] [address]  3) Special Requests:   Approved medications will be sent to the pharmacy, we will reach out if there is an issue.  Requests made after 3pm may not be addressed until the following business day!  If a patient is unsure of the name of the medication(s) please note and ask patient to call back when they are able to provide all info, do not send to responsible party until all information is available!

## 2019-05-04 NOTE — Patient Instructions (Signed)
Diabetes Mellitus and Foot Care Foot care is an important part of your health, especially when you have diabetes. Diabetes may cause you to have problems because of poor blood flow (circulation) to your feet and legs, which can cause your skin to:  Become thinner and drier.  Break more easily.  Heal more slowly.  Peel and crack. You may also have nerve damage (neuropathy) in your legs and feet, causing decreased feeling in them. This means that you may not notice minor injuries to your feet that could lead to more serious problems. Noticing and addressing any potential problems early is the best way to prevent future foot problems. How to care for your feet Foot hygiene  Wash your feet daily with warm water and mild soap. Do not use hot water. Then, pat your feet and the areas between your toes until they are completely dry. Do not soak your feet as this can dry your skin.  Trim your toenails straight across. Do not dig under them or around the cuticle. File the edges of your nails with an emery board or nail file.  Apply a moisturizing lotion or petroleum jelly to the skin on your feet and to dry, brittle toenails. Use lotion that does not contain alcohol and is unscented. Do not apply lotion between your toes. Shoes and socks  Wear clean socks or stockings every day. Make sure they are not too tight. Do not wear knee-high stockings since they may decrease blood flow to your legs.  Wear shoes that fit properly and have enough cushioning. Always look in your shoes before you put them on to be sure there are no objects inside.  To break in new shoes, wear them for just a few hours a day. This prevents injuries on your feet. Wounds, scrapes, corns, and calluses  Check your feet daily for blisters, cuts, bruises, sores, and redness. If you cannot see the bottom of your feet, use a mirror or ask someone for help.  Do not cut corns or calluses or try to remove them with medicine.  If you  find a minor scrape, cut, or break in the skin on your feet, keep it and the skin around it clean and dry. You may clean these areas with mild soap and water. Do not clean the area with peroxide, alcohol, or iodine.  If you have a wound, scrape, corn, or callus on your foot, look at it several times a day to make sure it is healing and not infected. Check for: ? Redness, swelling, or pain. ? Fluid or blood. ? Warmth. ? Pus or a bad smell. General instructions  Do not cross your legs. This may decrease blood flow to your feet.  Do not use heating pads or hot water bottles on your feet. They may burn your skin. If you have lost feeling in your feet or legs, you may not know this is happening until it is too late.  Protect your feet from hot and cold by wearing shoes, such as at the beach or on hot pavement.  Schedule a complete foot exam at least once a year (annually) or more often if you have foot problems. If you have foot problems, report any cuts, sores, or bruises to your health care provider immediately. Contact a health care provider if:  You have a medical condition that increases your risk of infection and you have any cuts, sores, or bruises on your feet.  You have an injury that is not   healing.  You have redness on your legs or feet.  You feel burning or tingling in your legs or feet.  You have pain or cramps in your legs and feet.  Your legs or feet are numb.  Your feet always feel cold.  You have pain around a toenail. Get help right away if:  You have a wound, scrape, corn, or callus on your foot and: ? You have pain, swelling, or redness that gets worse. ? You have fluid or blood coming from the wound, scrape, corn, or callus. ? Your wound, scrape, corn, or callus feels warm to the touch. ? You have pus or a bad smell coming from the wound, scrape, corn, or callus. ? You have a fever. ? You have a red line going up your leg. Summary  Check your feet every day  for cuts, sores, red spots, swelling, and blisters.  Moisturize feet and legs daily.  Wear shoes that fit properly and have enough cushioning.  If you have foot problems, report any cuts, sores, or bruises to your health care provider immediately.  Schedule a complete foot exam at least once a year (annually) or more often if you have foot problems. This information is not intended to replace advice given to you by your health care provider. Make sure you discuss any questions you have with your health care provider. Document Released: 07/25/2000 Document Revised: 09/09/2017 Document Reviewed: 08/29/2016 Elsevier Patient Education  2020 [employer]   Onychomycosis/Fungal Toenails  WHAT IS IT? An infection that lies within the keratin of your nail plate that is caused by a fungus.  WHY ME? Fungal infections affect all ages, sexes, races, and creeds.  There may be many factors that predispose you to a fungal infection such as age, coexisting medical conditions such as diabetes, or an autoimmune disease; stress, medications, fatigue, genetics, etc.  Bottom line: fungus thrives in a warm, moist environment and your shoes offer such a location.  IS IT CONTAGIOUS? Theoretically, yes.  You do not want to share shoes, nail clippers or files with someone who has fungal toenails.  Walking around barefoot in the same room or sleeping in the same bed is unlikely to transfer the organism.  It is important to realize, however, that fungus can spread easily from one nail to the next on the same foot.  HOW DO WE TREAT THIS?  There are several ways to treat this condition.  Treatment may depend on many factors such as age, medications, pregnancy, liver and kidney conditions, etc.  It is best to ask your doctor which options are available to you.  1. No treatment.   Unlike many other medical concerns, you can live with this condition.  However for many people this can be a painful condition and may lead to  ingrown toenails or a bacterial infection.  It is recommended that you keep the nails cut short to help reduce the amount of fungal nail. 2. Topical treatment.  These range from herbal remedies to prescription strength nail lacquers.  About 40-50% effective, topicals require twice daily application for approximately 9 to 12 months or until an entirely new nail has grown out.  The most effective topicals are medical grade medications available through physicians offices. 3. Oral antifungal medications.  With an 80-90% cure rate, the most common oral medication requires 3 to 4 months of therapy and stays in your system for a year as the new nail grows out.  Oral antifungal medications do require   blood work to make sure it is a safe drug for you.  A liver function panel will be performed prior to starting the medication and after the first month of treatment.  It is important to have the blood work performed to avoid any harmful side effects.  In general, this medication safe but blood work is required. 4. Laser Therapy.  This treatment is performed by applying a specialized laser to the affected nail plate.  This therapy is noninvasive, fast, and non-painful.  It is not covered by insurance and is therefore, out of pocket.  The results have been very good with a 80-95% cure rate.  [facility] is the only practice in the area to offer this therapy. 5. Permanent Nail Avulsion.  Removing the entire nail so that a new nail will not grow back. 

## 2019-05-10 NOTE — Progress Notes (Signed)
Subjective: Chris [name]. is seen today for follow up preventative diabetic foot care with calluses b/l and painful, elongated, thickened toenails 1-5 b/l feet that he cannot cut. Pain interferes with daily activities. Aggravating factor includes wearing enclosed shoe gear and relieved with periodic debridement.  Current Outpatient Medications on File Prior to Visit  Medication Sig  . albuterol (PROVENTIL HFA;VENTOLIN HFA) 108 (90 Base) MCG/ACT inhaler Inhale 2 puffs into the lungs every 6 (six) hours as needed for wheezing or shortness of breath.  [name] apixaban (ELIQUIS) 5 MG TABS tablet Take 1 tablet (5 mg total) by mouth 2 (two) times daily.  [name] atorvastatin (LIPITOR) 40 MG tablet Take 1 tablet (40 mg total) by mouth daily.  . baclofen (LIORESAL) 10 MG tablet Take 10 mg by mouth daily.   . bictegravir-emtricitabine-tenofovir AF (BIKTARVY) 50-200-25 MG TABS tablet Take 1 tablet by mouth daily.  . Blood Glucose Monitoring Suppl (ONETOUCH VERIO) w/Device KIT 1 kit by Does not apply route 2 (two) times daily.  . clotrimazole (LOTRIMIN) 1 % cream Apply 1 application topically 2 (two) times daily.  [name] diltiazem (CARDIZEM CD) 240 MG 24 hr capsule Take 1 capsule (240 mg total) by mouth daily.  [name] doxycycline (VIBRAMYCIN) 100 MG capsule Take 1 capsule (100 mg total) by mouth 2 (two) times daily.  . DULoxetine (CYMBALTA) 30 MG capsule TAKE 1 CAPSULE (30 MG TOTAL) BY MOUTH DAILY  . fluconazole (DIFLUCAN) 150 MG tablet   . gabapentin (NEURONTIN) 100 MG capsule Take 1 capsule by mouth daily.  [name] glucose blood (ONETOUCH VERIO) test strip Use as instructed  . Glycopyrrolate-Formoterol (BEVESPI AEROSPHERE) 9-4.8 MCG/ACT AERO Inhale 2 puffs into the lungs 2 (two) times daily.  . hydrALAZINE (APRESOLINE) 50 MG tablet Take 1.5 tablets (75 mg total) by mouth every 8 (eight) hours.  . Insulin Pen Needle (B-D UF III MINI PEN NEEDLES) 31G X 5 MM MISC Inject into the skin twice per day.  . liraglutide (VICTOZA) 18  MG/3ML SOPN SubQ:  Inject 0.6 mg once daily into the skin. Week 2: increase to 1.2 mg once daily; week 3: increase to 1.8 mg once daily  . lisinopril (ZESTRIL) 2.5 MG tablet Take 1 tablet (2.5 mg total) by mouth daily.  . metFORMIN (GLUCOPHAGE) 1000 MG tablet Take 1 tablet (1,000 mg total) by mouth 2 (two) times daily with a meal for 30 days.  Glory Rosebush DELICA LANCETS 77L MISC Use as instructed  . OXYGEN Inhale 2 L into the lungs as needed (shortness of breath). Uses every 2 days  . sildenafil (REVATIO) 20 MG tablet Take 1-5 tablets by mouth daily as needed 30 minutes prior to sex. Do not exceed 5 per day.  . sildenafil (VIAGRA) 50 MG tablet Take 1 tablet (50 mg total) by mouth daily as needed for erectile dysfunction.   No current facility-administered medications on file prior to visit.      Allergies  Allergen Reactions  . Bactrim [Sulfamethoxazole-Trimethoprim] Hives   Objective:  Vascular Examination: Capillary refill time immediate x 10 digits.  Dorsalis pedis present b/l.  Posterior tibial pulses present b/l.  Digital hair absent  x 10 digits.  Skin temperature gradient WNL b/l.   Dermatological Examination: Skin with normal turgor, texture and tone b/l.  Toenails 1-5 b/l discolored, thick, dystrophic with subungual debris and pain with palpation to nailbeds due to thickness of nails.  Hyperkeratotic lesions noted b/l hallux and submet head 4 left foot with tenderness to palpation. No edema, no  erythema, no drainage, no flocculence.  Musculoskeletal: Muscle strength 5/5 to all LE muscle groups.  Hammertoe deformity b/l 4th and 5th digits.   No pain, crepitus or joint limitation noted with ROM.   Neurological Examination: Protective sensation intact  5/5 b/l with 10 gram monofilament bilaterally.  Epicritic sensation present bilaterally.  Vibratory sensation diminished b/l.  Assessment: Painful onychomycosis toenails 1-5 b/l  Calluses b/l hallux and submet  head 4 left foot NIDDM with neuropathy  Plan: 1. Toenails 1-5 b/l were debrided in length and girth without iatrogenic bleeding. 2. Calluses pared submetatarsal head(s) 4 left foot and b/ hallux utilizing sterile scalpel blade without incident. 3. Patient to continue soft, supportive shoe gear daily.  4. Patient to report any pedal injuries to medical professional immediately. 5. Follow up 3 months.  6. Patient/POA to call should there be a concern in the interim.

## 2019-05-17 DIAGNOSIS — J449 Chronic obstructive pulmonary disease, unspecified: Secondary | ICD-10-CM | POA: Diagnosis not present

## 2019-05-17 DIAGNOSIS — J129 Viral pneumonia, unspecified: Secondary | ICD-10-CM | POA: Diagnosis not present

## 2019-05-19 DIAGNOSIS — R69 Illness, unspecified: Secondary | ICD-10-CM | POA: Diagnosis not present

## 2019-05-19 DIAGNOSIS — N5201 Erectile dysfunction due to arterial insufficiency: Secondary | ICD-10-CM | POA: Diagnosis not present

## 2019-05-24 ENCOUNTER — Other Ambulatory Visit: Payer: Self-pay | Admitting: Nurse Practitioner

## 2019-05-24 DIAGNOSIS — E118 Type 2 diabetes mellitus with unspecified complications: Secondary | ICD-10-CM

## 2019-05-24 DIAGNOSIS — Z794 Long term (current) use of insulin: Secondary | ICD-10-CM

## 2019-05-27 ENCOUNTER — Telehealth: Payer: Self-pay | Admitting: Nurse Practitioner

## 2019-05-27 NOTE — Telephone Encounter (Signed)
New Message   1) Medication(s) Requested (by name): acetaminophen-codeine (TYLENOL #3) 300-30 MG tablet  2) [facility]  3) Special Requests:   Approved medications will be sent to the pharmacy, we will reach out if there is an issue.  Requests made after 3pm may not be addressed until the following business day!  If a patient is unsure of the name of the medication(s) please note and ask patient to call back when they are able to provide all info, do not send to responsible party until all information is available!

## 2019-06-03 ENCOUNTER — Other Ambulatory Visit: Payer: Self-pay | Admitting: Nurse Practitioner

## 2019-06-03 MED ORDER — ACETAMINOPHEN-CODEINE #3 300-30 MG PO TABS: 1.0000 | ORAL_TABLET | Freq: Three times a day (TID) | ORAL | 0 refills | Status: DC | PRN

## 2019-06-04 ENCOUNTER — Other Ambulatory Visit: Payer: Self-pay | Admitting: Family

## 2019-06-04 ENCOUNTER — Other Ambulatory Visit: Payer: Self-pay | Admitting: Nurse Practitioner

## 2019-06-04 DIAGNOSIS — F4321 Adjustment disorder with depressed mood: Secondary | ICD-10-CM

## 2019-06-04 DIAGNOSIS — N481 Balanitis: Secondary | ICD-10-CM

## 2019-06-06 ENCOUNTER — Ambulatory Visit: Payer: Self-pay | Admitting: General Surgery

## 2019-06-06 ENCOUNTER — Telehealth: Payer: Self-pay | Admitting: Internal Medicine

## 2019-06-06 DIAGNOSIS — R69 Illness, unspecified: Secondary | ICD-10-CM | POA: Diagnosis not present

## 2019-06-06 MED ORDER — BEVESPI AEROSPHERE 9-4.8 MCG/ACT IN AERO: 2.0000 | INHALATION_SPRAY | Freq: Two times a day (BID) | RESPIRATORY_TRACT | 0 refills | Status: DC

## 2019-06-06 NOTE — Telephone Encounter (Signed)
l °

## 2019-06-06 NOTE — Telephone Encounter (Signed)
Spoke with pt. He needs a refill on Bevespi. Advised him that he is overdue for an appointment with MW. Pt has been scheduled to see MW on 06/10/2019 at 1115. Rx has been sent in. Nothing further was needed.

## 2019-06-06 NOTE — H&P (Signed)
History of Present Illness Chris Ruff MD; 01/60/1093 5:06 PM) The patient is a 53 year old male who presents with anal lesions. 53 year old male who presents to the office for evaluation of perianal lesions. He states this had been present for approximately 18 months and complains of occasional bleeding and pain with bowel movements. He has never had these before. Patient has HIV with a CD4 count of approximately 427 and undetectable viral load. He is anticoagulated with Elliquis for A fib. He has COPD and oxygen dependency. He also complains of some posterior scrotal lesions. He was seen by urology and they did not feel like these lesions needed to be excised.   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 Oral Surgery  Allergies Mammie Lorenzo, LPN; 62/83/1517 6:16 PM) Bactrim *ANTI-INFECTIVE AGENTS - MISC.*  Medication History Mammie Lorenzo, LPN; 07/37/1062 6:94 PM) Atorvastatin Calcium (40MG  Tablet, Oral) Active. Eliquis (5MG  Tablet, Oral) Active. metFORMIN HCl (1000MG  Tablet, Oral) Active. Victoza (18MG /3ML Soln Pen-inj, Subcutaneous) Active. Dapsone (100MG  Tablet, Oral) Active. Lantus (100UNIT/ML Solution, Subcutaneous) Active. Lisinopril (10MG  Tablet, Oral) Active. MetFORMIN HCl (500MG  Tablet, Oral) Active. ProAir HFA (108 (90 Base)MCG/ACT Aerosol Soln, Inhalation) Active. Spiriva Respimat (2.5MCG/ACT Aerosol Soln, Inhalation) Active. Qvar (80MCG/ACT Aerosol Soln, Inhalation) Active. Triamcinolone Acetonide (0.1% Cream, External) Active. Prezista (800MG  Tablet, Oral) Active. Medications Reconciled  Social History Chris Ruff, MD; 85/46/2703 5:06 PM) Alcohol use Remotely quit alcohol use. Caffeine use Tea. Illicit drug use Remotely quit drug use. Tobacco use Current some day smoker.  Family  History Chris Ruff, MD; 50/04/3817 5:06 PM) Alcohol Abuse Father. Bleeding disorder Family Members In General. Breast Cancer Sister. Hypertension Family Members In General, Father.  Other Problems Chris Ruff, MD; 29/93/7169 5:06 PM) Asthma Back Pain Chronic Obstructive Lung Disease Diabetes Mellitus High blood pressure HIV-positive     Review of Systems Chris Ruff MD; 67/89/3810 5:06 PM) General Present- Weight Gain. Not Present- Appetite Loss, Chills, Fatigue, Fever, Night Sweats and Weight Loss. Skin Not Present- Change in Wart/Mole, Dryness, Hives, Jaundice, New Lesions, Non-Healing Wounds, Rash and Ulcer. HEENT Not Present- Earache, Hearing Loss, Hoarseness, Nose Bleed, Oral Ulcers, Ringing in the Ears, Seasonal Allergies, Sinus Pain, Sore Throat, Visual Disturbances, Wears glasses/contact lenses and Yellow Eyes. Cardiovascular Present- Shortness of Breath. Not Present- Chest Pain, Difficulty Breathing Lying Down, Leg Cramps, Palpitations, Rapid Heart Rate and Swelling of Extremities. Gastrointestinal Not Present- Abdominal Pain, Bloating, Bloody Stool, Change in Bowel Habits, Chronic diarrhea, Constipation, Difficulty Swallowing, Excessive gas, Gets full quickly at meals, Hemorrhoids, Indigestion, Nausea, Rectal Pain and Vomiting. Male Genitourinary Present- Nocturia. Not Present- Blood in Urine, Change in Urinary Stream, Frequency, Impotence, Painful Urination, Urgency and Urine Leakage.  Vitals Claiborne Billings Dockery LPN; 17/51/0258 5:27 PM) 06/06/2019 4:39 PM Weight: 256.6 lb Height: 69in Body Surface Area: 2.3 m Body Mass Index: 37.89 kg/m  Temp.: 98.55F(Thermal Scan)  Pulse: 84 (Regular)  BP: 142/78 (Sitting, Left Arm, Standard)        Physical Exam Chris Ruff MD; 78/24/2353 5:07 PM)  General Mental Status-Alert. General Appearance-Not in acute distress. Build & Nutrition-Well nourished. Posture-Normal  posture. Gait-Normal.  Head and Neck Head-normocephalic, atraumatic with no lesions or palpable masses. Trachea-midline.  Chest and Lung Exam Chest and lung exam reveals -on auscultation, normal breath sounds, no adventitious sounds and normal vocal resonance.  Cardiovascular Cardiovascular examination reveals -normal heart sounds, regular rate and rhythm with no murmurs  and no digital clubbing, cyanosis, edema, increased warmth or tenderness.  Abdomen Inspection Inspection of the abdomen reveals - No Hernias. Palpation/Percussion Palpation and Percussion of the abdomen reveal - Soft, Non Tender, No Rigidity (guarding), No hepatosplenomegaly and No Palpable abdominal masses.  Rectal Anorectal Exam Internal - Note: No internal lesions palpated. Note: Several flat lesions with skin breakdown concerning for condyloma or possible AIN.  Neurologic Neurologic evaluation reveals -alert and oriented x 3 with no impairment of recent or remote memory, normal attention span and ability to concentrate, normal sensation and normal coordination.  Musculoskeletal Normal Exam - Bilateral-Upper Extremity Strength Normal and Lower Extremity Strength Normal.    Assessment & Plan Romie Levee MD; 06/06/2019 5:03 PM)  ANAL CONDYLOMA (A63.0) Impression: Patient has 3 larger pedunculated lesions at anterior midline. These will require excision. He has some smaller lesions which we could ablate with the laser. We would also do an internal exam and ablate any large lesions internally. We would take biopsies of anything that seemed abnormal. We discussed typical postoperative pain and wound healing. We discussed that he is at increased risk for wound healing and complications related to this. Due to his chronic COPD and diabetes. I believe he understands this and wishes to proceed with surgery.

## 2019-06-10 ENCOUNTER — Other Ambulatory Visit: Payer: Self-pay

## 2019-06-10 ENCOUNTER — Ambulatory Visit (INDEPENDENT_AMBULATORY_CARE_PROVIDER_SITE_OTHER): Payer: Medicare HMO | Admitting: Internal Medicine

## 2019-06-10 ENCOUNTER — Encounter: Payer: Self-pay | Admitting: Internal Medicine

## 2019-06-10 DIAGNOSIS — F17210 Nicotine dependence, cigarettes, uncomplicated: Secondary | ICD-10-CM

## 2019-06-10 DIAGNOSIS — R69 Illness, unspecified: Secondary | ICD-10-CM | POA: Diagnosis not present

## 2019-06-10 DIAGNOSIS — J449 Chronic obstructive pulmonary disease, unspecified: Secondary | ICD-10-CM | POA: Diagnosis not present

## 2019-06-10 DIAGNOSIS — J9611 Chronic respiratory failure with hypoxia: Secondary | ICD-10-CM | POA: Diagnosis not present

## 2019-06-10 DIAGNOSIS — J9612 Chronic respiratory failure with hypercapnia: Secondary | ICD-10-CM | POA: Diagnosis not present

## 2019-06-10 NOTE — Patient Instructions (Addendum)
To get the most out of exercise, you need to be continuously aware that you are short of breath, but never out of breath, for 30 minutes daily. As you improve, it will actually be easier for you to do the same amount of exercise  in  30 minutes so always push to the level where you are short of breath.    No change in medications    Please schedule a follow up visit in 6 months but call sooner if needed  

## 2019-06-10 NOTE — Assessment & Plan Note (Signed)
Body mass index is 38.25 kg/m.  -  trending up Lab Results  Component Value Date   TSH 0.487 07/25/2017     Contributing to gerd risk/ doe/reviewed the need and the process to achieve and maintain neg calorie balance > defer f/u primary care including intermittently monitoring thyroid status     I had an extended discussion with the patient reviewing all relevant studies completed to date and  lasting 15 to 20 minutes of a 25 minute visit    I performed detailed device teaching using a teach back method which extended face to face time for this visit (see above)  Each maintenance medication was reviewed in detail including emphasizing most importantly the difference between maintenance and prns and under what circumstances the prns are to be triggered using an action plan format that is not reflected in the computer generated alphabetically organized AVS which I have not found useful in most complex patients, especially with respiratory illnesses  Please see AVS for specific instructions unique to this visit that I personally wrote and verbalized to the the pt in detail and then reviewed with pt  by my nurse highlighting any  changes in therapy recommended at today's visit to their plan of care.

## 2019-06-10 NOTE — Assessment & Plan Note (Signed)
Active smoker - 04/28/2013 PFT's FEV1 1.21 (39%) ratio 59 and 13% better p B2 and dlco 72 corrects to 102  - started spiriva 04/30/2013 > changed to respimat 02/28/2014 -Med calendar 03/14/2014 > not using as of 05/16/14  - arrived on stiolto / symbicort 02/29/2016 > changed to symbicort 80/spiriva (lower dose ICS due to interaction with aids meds  - PFT's  06/17/2016  FEV1 1.29 (42 % ) ratio 64  p 43 % improvement from saba p symb 80 /spiriva prior to study with DLCO  51/49c % corrects to 86  % for alv volume   - 06/17/2016   change symb to 160 2bid  - 09/25/2016  After extensive coaching device  effectiveness =    90% with dpi/ elipta > changed to Incruse per Insurance restrictions > preferred spiriva - 12/11/2016   changed to spiriva respimat  08/05/2017  After extensive coaching HFA effectiveness =  90%    - 09/22/2017 changed to symb 80 2bid with pseudowheeze and thrush  - PFT's  03/22/2018  FEV1 1.34 (45 % ) ratio 63  p 17 % improvement from saba p sym 160/ incruse prior to study with DLCO  36/41 % corrects to 72  % for alv volume   - trial of bevespi 2bid 05/17/2018 >>> improved as of 10/04/2018  06/10/2019  After extensive coaching inhaler device,  effectiveness =    75% (short ti)   No exac hx so Pt is Group B in terms of symptom/risk and laba/lama therefore appropriate rx at this point >>>  Continue bevespi 2bid, change to [place] if exac tendency assuming ICS ok with HIV drugs

## 2019-06-10 NOTE — Assessment & Plan Note (Signed)
Counseled re importance of smoking cessation but did not meet time criteria for separate billing   °
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
## 2019-06-10 NOTE — Assessment & Plan Note (Signed)
HCO3 31 01/17/16 - 01/05/2015  Walked RA x 3 laps @ 185 ft each stopped due to  Leg pain and fatibue, no sob , nl pace, no desat  -  12/11/2016 Patient Saturations on Room Air at Rest = 88%----increased 98% 2lpm continuous - HC03  24    03/02/18  - 03/22/2018  Walked RA x 3 laps @ 185 ft each stopped due to  End of study, moderate pace, no   desat  / min sob - 05/17/2018  Walked 2lpm x 3 laps @ 185 ft each stopped due to  End of study, no desat   - HCO3  03/02/19  = 21   Hypercarbia better but sats borderline likely more related to wt gain than copd > monitor sats with goal of keeping sats > 90%

## 2019-06-17 DIAGNOSIS — J129 Viral pneumonia, unspecified: Secondary | ICD-10-CM | POA: Diagnosis not present

## 2019-06-17 DIAGNOSIS — J449 Chronic obstructive pulmonary disease, unspecified: Secondary | ICD-10-CM | POA: Diagnosis not present

## 2019-07-04 ENCOUNTER — Other Ambulatory Visit: Payer: Self-pay | Admitting: Nurse Practitioner

## 2019-07-04 NOTE — Telephone Encounter (Signed)
Pain med refill request. Last filled 10/23

## 2019-07-04 NOTE — Telephone Encounter (Signed)
Pt. Called and request for his Tylenol #3 refill to [facility].

## 2019-07-05 ENCOUNTER — Other Ambulatory Visit: Payer: Self-pay | Admitting: Nurse Practitioner

## 2019-07-05 MED ORDER — ACETAMINOPHEN-CODEINE 300-30 MG PO TABS: ORAL_TABLET | ORAL | 0 refills | Status: DC

## 2019-07-08 ENCOUNTER — Other Ambulatory Visit: Payer: Self-pay | Admitting: Internal Medicine

## 2019-07-17 DIAGNOSIS — J129 Viral pneumonia, unspecified: Secondary | ICD-10-CM | POA: Diagnosis not present

## 2019-07-17 DIAGNOSIS — J449 Chronic obstructive pulmonary disease, unspecified: Secondary | ICD-10-CM | POA: Diagnosis not present

## 2019-07-25 ENCOUNTER — Encounter (HOSPITAL_BASED_OUTPATIENT_CLINIC_OR_DEPARTMENT_OTHER): Payer: Self-pay | Admitting: General Surgery

## 2019-07-25 ENCOUNTER — Other Ambulatory Visit (HOSPITAL_COMMUNITY)
Admission: RE | Admit: 2019-07-25 | Discharge: 2019-07-25 | Disposition: A | Discharge status: Home / Self Care | Payer: Medicare HMO | Source: Ambulatory Visit | Attending: General Surgery | Admitting: General Surgery

## 2019-07-25 ENCOUNTER — Other Ambulatory Visit: Payer: Self-pay

## 2019-07-25 DIAGNOSIS — Z01812 Encounter for preprocedural laboratory examination: Secondary | ICD-10-CM | POA: Diagnosis not present

## 2019-07-25 DIAGNOSIS — Z20828 Contact with and (suspected) exposure to other viral communicable diseases: Secondary | ICD-10-CM | POA: Diagnosis not present

## 2019-07-25 NOTE — Anesthesia Preprocedure Evaluation (Addendum)
Anesthesia Evaluation  Patient identified by MRN, date of birth, ID band Patient awake    Reviewed: Allergy & Precautions, NPO status , Patient's Chart, lab work & pertinent test results  Airway Mallampati: III  TM Distance: >3 FB Neck ROM: Full    Dental  (+) Poor Dentition, Missing, Loose   Pulmonary sleep apnea and Continuous Positive Airway Pressure Ventilation , pneumonia, resolved, COPD,  COPD inhaler, Current Smoker and Patient abstained from smoking.,    Pulmonary exam normal breath sounds clear to auscultation       Cardiovascular hypertension, Pt. on medications +CHF  Normal cardiovascular exam Rhythm:Regular Rate:Normal     Neuro/Psych PSYCHIATRIC DISORDERS Anxiety Depression negative neurological ROS     GI/Hepatic GERD  Medicated and Controlled,anal condyloma   Endo/Other  diabetes, Well Controlled, Type 2, Oral Hypoglycemic AgentsHyperlipidemia Obesity  Renal/GU Renal InsufficiencyRenal disease  negative genitourinary   Musculoskeletal  (+) Arthritis , Osteoarthritis,    Abdominal (+) + obese,   Peds  Hematology  (+) anemia , HIV,   Anesthesia Other Findings   Reproductive/Obstetrics                        Anesthesia Physical Anesthesia Plan  ASA: III  Anesthesia Plan: MAC   Post-op Pain Management:    Induction: Intravenous  PONV Risk Score and Plan: 1 and Ondansetron, Propofol infusion and Treatment may vary due to age or medical condition  Airway Management Planned: Natural Airway, Nasal Cannula and Simple Face Mask  Additional Equipment:   Intra-op Plan:   Post-operative Plan:   Informed Consent: I have reviewed the patients History and Physical, chart, labs and discussed the procedure including the risks, benefits and alternatives for the proposed anesthesia with the patient or authorized representative who has indicated his/her understanding and acceptance.      Dental advisory given  Plan Discussed with: CRNA and Surgeon  Anesthesia Plan Comments: (See PAT note 07/25/2019, Konrad Felix, PA-C)      Anesthesia Quick Evaluation
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
## 2019-07-25 NOTE — Progress Notes (Signed)
Spoke w/ via phone for pre-op interview---Chris [name] needs dos----    I stat 8,            [name] results------ekg 03-01-2019 COVID test ------07-25-2019 Arrive at -------530 07-28-2019  NPO after ------midnight Medications to take morning of surgery -----tylenol # 3, albuterol inhaler prn and bring inhaler, bevespi duloxetine, gabapentin, atorvastatin, biktarvy Diabetic medication -----none day of surgery Patient Special Instructions ----- Pre-Op special Istructions ----- Patient verbalized understanding of instructions that were given at this phone interview. Patient denies shortness of breath, chest pain, fever, cough a this phone interview.  Anesthesia Review: secure chat sent to jessica zanetto pa for chart review for surgery center  OTL:[id number] health and wellness Cardiologist :Ellie Lunch lawrence np 7-20/20 epic Chest x-ray :none EKG :02-27-19 epic Echo :none Cardiac Cath : none Sleep Study/ CPAP : none Fasting Blood Sugar :      / Checks Blood Sugar -- times a day:n/a   Blood Thinner/ Instructions /Last Dose: patient to stay on eliquids per dr Marcello Moores note (spoke with wendy as ccs) ASA / Instructions/ Last Dose : n/a  Patient denies shortness of breath, chest pain, fever, and cough at this phone interview.

## 2019-07-26 LAB — NOVEL CORONAVIRUS, NAA (HOSP ORDER, SEND-OUT TO REF LAB; TAT 18-24 HRS): SARS-CoV-2, NAA: NOT DETECTED

## 2019-07-27 ENCOUNTER — Encounter (HOSPITAL_BASED_OUTPATIENT_CLINIC_OR_DEPARTMENT_OTHER): Payer: Self-pay | Admitting: General Surgery

## 2019-07-27 NOTE — Progress Notes (Signed)
Chart reviewed by anesthesia, Konrad Felix PA, stated ok to proceed (refer to her progress note).

## 2019-07-28 ENCOUNTER — Other Ambulatory Visit: Payer: Self-pay

## 2019-07-28 ENCOUNTER — Ambulatory Visit (HOSPITAL_BASED_OUTPATIENT_CLINIC_OR_DEPARTMENT_OTHER): Payer: Medicare HMO | Admitting: Physician Assistant

## 2019-07-28 ENCOUNTER — Ambulatory Visit (HOSPITAL_BASED_OUTPATIENT_CLINIC_OR_DEPARTMENT_OTHER)
Admission: RE | Admit: 2019-07-28 | Discharge: 2019-07-28 | Disposition: A | Discharge status: Home / Self Care | Payer: Medicare HMO | Attending: General Surgery | Admitting: General Surgery

## 2019-07-28 ENCOUNTER — Encounter (HOSPITAL_BASED_OUTPATIENT_CLINIC_OR_DEPARTMENT_OTHER): Payer: Self-pay | Admitting: General Surgery

## 2019-07-28 ENCOUNTER — Encounter (HOSPITAL_BASED_OUTPATIENT_CLINIC_OR_DEPARTMENT_OTHER)
Admission: RE | Disposition: A | Discharge status: Home / Self Care | Payer: Self-pay | Source: Home / Self Care | Attending: General Surgery

## 2019-07-28 DIAGNOSIS — Z79899 Other long term (current) drug therapy: Secondary | ICD-10-CM | POA: Insufficient documentation

## 2019-07-28 DIAGNOSIS — I5032 Chronic diastolic (congestive) heart failure: Secondary | ICD-10-CM | POA: Diagnosis not present

## 2019-07-28 DIAGNOSIS — K219 Gastro-esophageal reflux disease without esophagitis: Secondary | ICD-10-CM | POA: Diagnosis not present

## 2019-07-28 DIAGNOSIS — Z9981 Dependence on supplemental oxygen: Secondary | ICD-10-CM | POA: Insufficient documentation

## 2019-07-28 DIAGNOSIS — K6289 Other specified diseases of anus and rectum: Secondary | ICD-10-CM | POA: Diagnosis not present

## 2019-07-28 DIAGNOSIS — I4891 Unspecified atrial fibrillation: Secondary | ICD-10-CM | POA: Diagnosis not present

## 2019-07-28 DIAGNOSIS — Z7901 Long term (current) use of anticoagulants: Secondary | ICD-10-CM | POA: Insufficient documentation

## 2019-07-28 DIAGNOSIS — Z7951 Long term (current) use of inhaled steroids: Secondary | ICD-10-CM | POA: Insufficient documentation

## 2019-07-28 DIAGNOSIS — F329 Major depressive disorder, single episode, unspecified: Secondary | ICD-10-CM | POA: Diagnosis not present

## 2019-07-28 DIAGNOSIS — Z794 Long term (current) use of insulin: Secondary | ICD-10-CM | POA: Diagnosis not present

## 2019-07-28 DIAGNOSIS — F17200 Nicotine dependence, unspecified, uncomplicated: Secondary | ICD-10-CM | POA: Diagnosis not present

## 2019-07-28 DIAGNOSIS — I110 Hypertensive heart disease with heart failure: Secondary | ICD-10-CM | POA: Diagnosis not present

## 2019-07-28 DIAGNOSIS — Z21 Asymptomatic human immunodeficiency virus [HIV] infection status: Secondary | ICD-10-CM | POA: Insufficient documentation

## 2019-07-28 DIAGNOSIS — F419 Anxiety disorder, unspecified: Secondary | ICD-10-CM | POA: Insufficient documentation

## 2019-07-28 DIAGNOSIS — A630 Anogenital (venereal) warts: Secondary | ICD-10-CM | POA: Insufficient documentation

## 2019-07-28 DIAGNOSIS — J439 Emphysema, unspecified: Secondary | ICD-10-CM | POA: Insufficient documentation

## 2019-07-28 DIAGNOSIS — E119 Type 2 diabetes mellitus without complications: Secondary | ICD-10-CM | POA: Insufficient documentation

## 2019-07-28 DIAGNOSIS — R69 Illness, unspecified: Secondary | ICD-10-CM | POA: Diagnosis not present

## 2019-07-28 DIAGNOSIS — I509 Heart failure, unspecified: Secondary | ICD-10-CM | POA: Insufficient documentation

## 2019-07-28 DIAGNOSIS — Z8249 Family history of ischemic heart disease and other diseases of the circulatory system: Secondary | ICD-10-CM | POA: Insufficient documentation

## 2019-07-28 HISTORY — PX: LASER ABLATION CONDOLAMATA: SHX5941

## 2019-07-28 HISTORY — PX: RECTAL EXAM UNDER ANESTHESIA: SHX6399

## 2019-07-28 LAB — GLUCOSE, CAPILLARY: Glucose-Capillary: 112 mg/dL — ABNORMAL HIGH (ref 70–99)

## 2019-07-28 LAB — POCT I-STAT, CHEM 8
BUN: 20 mg/dL (ref 6–20)
Calcium, Ion: 1.19 mmol/L (ref 1.15–1.40)
Chloride: 102 mmol/L (ref 98–111)
Creatinine, Ser: 1.1 mg/dL (ref 0.61–1.24)
Glucose, Bld: 99 mg/dL (ref 70–99)
HCT: 41 % (ref 39.0–52.0)
Hemoglobin: 13.9 g/dL (ref 13.0–17.0)
Potassium: 3.8 mmol/L (ref 3.5–5.1)
Sodium: 139 mmol/L (ref 135–145)
TCO2: 25 mmol/L (ref 22–32)

## 2019-07-28 SURGERY — EXAM UNDER ANESTHESIA, RECTUM
Anesthesia: Monitor Anesthesia Care | Site: Rectum

## 2019-07-28 MED ORDER — PROPOFOL 10 MG/ML IV BOLUS
INTRAVENOUS | Status: AC
  Filled 2019-07-28: qty 20

## 2019-07-28 MED ORDER — ONDANSETRON HCL 4 MG/2ML IJ SOLN
4.0000 mg | Freq: Once | INTRAMUSCULAR | Status: DC | PRN
  Filled 2019-07-28: qty 2

## 2019-07-28 MED ORDER — FENTANYL CITRATE (PF) 100 MCG/2ML IJ SOLN
INTRAMUSCULAR | Status: DC | PRN
  Administered 2019-07-28 (×4): 25 ug via INTRAVENOUS

## 2019-07-28 MED ORDER — DEXAMETHASONE SODIUM PHOSPHATE 4 MG/ML IJ SOLN
INTRAMUSCULAR | Status: DC | PRN
  Administered 2019-07-28: 5 mg via INTRAVENOUS

## 2019-07-28 MED ORDER — MIDAZOLAM HCL 2 MG/2ML IJ SOLN
INTRAMUSCULAR | Status: AC
  Filled 2019-07-28: qty 2

## 2019-07-28 MED ORDER — PROPOFOL 500 MG/50ML IV EMUL
INTRAVENOUS | Status: DC | PRN
  Administered 2019-07-28: 75 ug/kg/min via INTRAVENOUS

## 2019-07-28 MED ORDER — BUPIVACAINE LIPOSOME 1.3 % IJ SUSP
INTRAMUSCULAR | Status: AC
  Filled 2019-07-28: qty 20

## 2019-07-28 MED ORDER — SODIUM CHLORIDE 0.9% FLUSH
3.0000 mL | Freq: Two times a day (BID) | INTRAVENOUS | Status: DC
  Filled 2019-07-28: qty 3

## 2019-07-28 MED ORDER — BUPIVACAINE-EPINEPHRINE 0.25% -1:200000 IJ SOLN
INTRAMUSCULAR | Status: AC
  Filled 2019-07-28: qty 1

## 2019-07-28 MED ORDER — KETOROLAC TROMETHAMINE 30 MG/ML IJ SOLN
INTRAMUSCULAR | Status: AC
  Filled 2019-07-28: qty 1

## 2019-07-28 MED ORDER — LACTATED RINGERS IV SOLN
INTRAVENOUS | Status: DC
  Filled 2019-07-28: qty 1000

## 2019-07-28 MED ORDER — BUPIVACAINE-EPINEPHRINE (PF) 0.25% -1:200000 IJ SOLN
INTRAMUSCULAR | Status: DC | PRN
  Administered 2019-07-28: 30 mL

## 2019-07-28 MED ORDER — ONDANSETRON HCL 4 MG/2ML IJ SOLN
INTRAMUSCULAR | Status: DC | PRN
  Administered 2019-07-28: 4 mg via INTRAVENOUS

## 2019-07-28 MED ORDER — KETOROLAC TROMETHAMINE 30 MG/ML IJ SOLN
INTRAMUSCULAR | Status: DC | PRN
  Administered 2019-07-28: 30 mg via INTRAVENOUS

## 2019-07-28 MED ORDER — OXYCODONE HCL 5 MG PO TABS
5.0000 mg | ORAL_TABLET | Freq: Once | ORAL | Status: DC | PRN
  Filled 2019-07-28: qty 1

## 2019-07-28 MED ORDER — MIDAZOLAM HCL 5 MG/5ML IJ SOLN
INTRAMUSCULAR | Status: DC | PRN
  Administered 2019-07-28 (×2): 1 mg via INTRAVENOUS
  Administered 2019-07-28: 2 mg via INTRAVENOUS

## 2019-07-28 MED ORDER — LIDOCAINE HCL (CARDIAC) PF 100 MG/5ML IV SOSY
PREFILLED_SYRINGE | INTRAVENOUS | Status: DC | PRN
  Administered 2019-07-28: 60 mg via INTRAVENOUS

## 2019-07-28 MED ORDER — OXYCODONE HCL 5 MG/5ML PO SOLN
5.0000 mg | Freq: Once | ORAL | Status: DC | PRN
  Filled 2019-07-28: qty 5

## 2019-07-28 MED ORDER — ONDANSETRON HCL 4 MG/2ML IJ SOLN
INTRAMUSCULAR | Status: AC
  Filled 2019-07-28: qty 2

## 2019-07-28 MED ORDER — ONABOTULINUMTOXINA 100 UNITS IJ SOLR
INTRAMUSCULAR | Status: AC
  Filled 2019-07-28: qty 100

## 2019-07-28 MED ORDER — SILVER SULFADIAZINE 1 % EX CREA
TOPICAL_CREAM | CUTANEOUS | Status: AC
  Filled 2019-07-28: qty 50

## 2019-07-28 MED ORDER — PROPOFOL 500 MG/50ML IV EMUL
INTRAVENOUS | Status: AC
  Filled 2019-07-28: qty 50

## 2019-07-28 MED ORDER — FENTANYL CITRATE (PF) 100 MCG/2ML IJ SOLN
25.0000 ug | INTRAMUSCULAR | Status: DC | PRN
  Filled 2019-07-28: qty 1

## 2019-07-28 MED ORDER — ALBUTEROL SULFATE (2.5 MG/3ML) 0.083% IN NEBU
INHALATION_SOLUTION | RESPIRATORY_TRACT | Status: AC
  Filled 2019-07-28: qty 3

## 2019-07-28 MED ORDER — LIDOCAINE 2% (20 MG/ML) 5 ML SYRINGE
INTRAMUSCULAR | Status: AC
  Filled 2019-07-28: qty 5

## 2019-07-28 MED ORDER — LIDOCAINE 5 % EX OINT
TOPICAL_OINTMENT | CUTANEOUS | Status: AC
  Filled 2019-07-28: qty 35.44

## 2019-07-28 MED ORDER — FENTANYL CITRATE (PF) 100 MCG/2ML IJ SOLN
INTRAMUSCULAR | Status: AC
  Filled 2019-07-28: qty 2

## 2019-07-28 MED ORDER — ALBUTEROL SULFATE (2.5 MG/3ML) 0.083% IN NEBU
2.5000 mg | INHALATION_SOLUTION | Freq: Once | RESPIRATORY_TRACT | Status: AC
  Administered 2019-07-28: 2.5 mg via RESPIRATORY_TRACT
  Filled 2019-07-28: qty 3

## 2019-07-28 SURGICAL SUPPLY — 55 items
BENZOIN TINCTURE PRP APPL 2/3 (GAUZE/BANDAGES/DRESSINGS) ×6 IMPLANT
BLADE EXTENDED COATED 6.5IN (ELECTRODE) IMPLANT
BLADE HEX COATED 2.75 (ELECTRODE) ×3 IMPLANT
BLADE SURG 10 STRL SS (BLADE) IMPLANT
BNDG GAUZE ELAST 4 BULKY (GAUZE/BANDAGES/DRESSINGS) ×1 IMPLANT
BRIEF STRETCH FOR OB PAD LRG (UNDERPADS AND DIAPERS) ×3 IMPLANT
CANISTER SUCT 3000ML PPV (MISCELLANEOUS) ×3 IMPLANT
COVER BACK TABLE 60X90IN (DRAPES) ×3 IMPLANT
COVER MAYO STAND STRL (DRAPES) ×3 IMPLANT
COVER WAND RF STERILE (DRAPES) ×5 IMPLANT
DECANTER SPIKE VIAL GLASS SM (MISCELLANEOUS) ×3 IMPLANT
DERMABOND ADVANCED (GAUZE/BANDAGES/DRESSINGS) ×1
DERMABOND ADVANCED .7 DNX12 (GAUZE/BANDAGES/DRESSINGS) IMPLANT
DRAPE LAPAROTOMY 100X72 PEDS (DRAPES) ×3 IMPLANT
DRAPE UTILITY XL STRL (DRAPES) ×3 IMPLANT
DRSG PAD ABDOMINAL 8X10 ST (GAUZE/BANDAGES/DRESSINGS) ×3 IMPLANT
ELECT REM PT RETURN 9FT ADLT (ELECTROSURGICAL) ×3
ELECTRODE REM PT RTRN 9FT ADLT (ELECTROSURGICAL) ×2 IMPLANT
GAUZE SPONGE 4X4 12PLY STRL (GAUZE/BANDAGES/DRESSINGS) ×3 IMPLANT
GLOVE BIO SURGEON STRL SZ 6.5 (GLOVE) ×3 IMPLANT
GLOVE BIOGEL PI IND STRL 7.0 (GLOVE) ×2 IMPLANT
GLOVE BIOGEL PI INDICATOR 7.0 (GLOVE) ×1
GOWN STRL REUS W/TWL XL LVL3 (GOWN DISPOSABLE) ×3 IMPLANT
HYDROGEN PEROXIDE 16OZ (MISCELLANEOUS) ×3 IMPLANT
IV CATH 14GX2 1/4 (CATHETERS) ×3 IMPLANT
IV CATH 18G SAFETY (IV SOLUTION) ×3 IMPLANT
KIT SIGMOIDOSCOPE (SET/KITS/TRAYS/PACK) IMPLANT
KIT TURNOVER CYSTO (KITS) ×3 IMPLANT
LOOP VESSEL MAXI BLUE (MISCELLANEOUS) IMPLANT
NDL SAFETY ECLIPSE 18X1.5 (NEEDLE) IMPLANT
NEEDLE HYPO 18GX1.5 SHARP (NEEDLE)
NEEDLE HYPO 22GX1.5 SAFETY (NEEDLE) ×3 IMPLANT
NS IRRIG 500ML POUR BTL (IV SOLUTION) ×3 IMPLANT
PACK BASIN DAY SURGERY FS (CUSTOM PROCEDURE TRAY) ×3 IMPLANT
PAD ABD 8X10 STRL (GAUZE/BANDAGES/DRESSINGS) ×1 IMPLANT
PAD ARMBOARD 7.5X6 YLW CONV (MISCELLANEOUS) IMPLANT
PENCIL BUTTON HOLSTER BLD 10FT (ELECTRODE) ×3 IMPLANT
SPONGE HEMORRHOID 8X3CM (HEMOSTASIS) IMPLANT
SPONGE SURGIFOAM ABS GEL 12-7 (HEMOSTASIS) IMPLANT
SUCTION FRAZIER HANDLE 10FR (MISCELLANEOUS)
SUCTION TUBE FRAZIER 10FR DISP (MISCELLANEOUS) IMPLANT
SUT CHROMIC 2 0 SH (SUTURE) ×1 IMPLANT
SUT CHROMIC 3 0 SH 27 (SUTURE) IMPLANT
SUT ETHIBOND 0 (SUTURE) IMPLANT
SUT VIC AB 2-0 SH 27 (SUTURE) ×1
SUT VIC AB 2-0 SH 27XBRD (SUTURE) IMPLANT
SUT VIC AB 3-0 SH 18 (SUTURE) ×1 IMPLANT
SUT VIC AB 3-0 SH 27 (SUTURE)
SUT VIC AB 3-0 SH 27XBRD (SUTURE) IMPLANT
SYR CONTROL 10ML LL (SYRINGE) ×3 IMPLANT
TOWEL OR 17X26 10 PK STRL BLUE (TOWEL DISPOSABLE) ×5 IMPLANT
TRAY DSU PREP LF (CUSTOM PROCEDURE TRAY) ×3 IMPLANT
TUBE CONNECTING 12X1/4 (SUCTIONS) ×3 IMPLANT
VACUUM HOSE 7/8X10 W/ WAND (MISCELLANEOUS) ×3 IMPLANT
YANKAUER SUCT BULB TIP NO VENT (SUCTIONS) ×3 IMPLANT

## 2019-07-28 NOTE — Anesthesia Postprocedure Evaluation (Signed)
Anesthesia Post Note  Patient: Chris [name].  Procedure(s) Performed: ANAL EXAM UNDER ANESTHESIA (N/A Rectum) EXCISION OF PERIANAL CONDYLOMA AND LASER ABLATION (N/A Perineum)     Patient location during evaluation: PACU Anesthesia Type: MAC Level of consciousness: awake and alert and oriented Pain management: pain level controlled Vital Signs Assessment: post-procedure vital signs reviewed and stable Respiratory status: spontaneous breathing, nonlabored ventilation and respiratory function stable Cardiovascular status: stable and blood pressure returned to baseline Postop Assessment: no apparent nausea or vomiting Anesthetic complications: no Comments: SpO2's stable and back to baseline.    Last Vitals:  Vitals:   07/28/19 0930 07/28/19 0938  BP: (!) 114/58   Pulse: 78   Resp: 16   Temp:    SpO2: (!) 88% 90%    Last Pain:  Vitals:   07/28/19 0930  TempSrc:   PainSc: 0-No pain                 Mohammedali Bedoy A.

## 2019-07-28 NOTE — Transfer of Care (Signed)
Immediate Anesthesia Transfer of Care Note  Patient: Trashaun Streight Watrous [name]  Procedure(s) Performed: Procedure(s) (LRB): ANAL EXAM UNDER ANESTHESIA (N/A) EXCISION OF PERIANAL CONDYLOMA AND LASER ABLATION (N/A)  Patient Location: PACU  Anesthesia Type: General  Level of Consciousness: awake, sedated, patient cooperative and responds to stimulation  Airway & Oxygen Therapy: Patient Spontanous Breathing and Patient connected to face mask oxygen  Post-op Assessment: Report given to PACU RN, Post -op Vital signs reviewed and stable and Patient moving all extremities  Post vital signs: Reviewed and stable  Complications: No apparent anesthesia complications

## 2019-07-28 NOTE — Discharge Instructions (Addendum)
Post Operative Instructions Following Laser Surgery  Laser treatment of condyloma (warts) is used to vaporize or eliminate the wart.  The laser actually creates a burn effect on the skin to accomplish this.  The following instructions will help in you comfort postoperatively:  First 24 h  Remove the dressing this evening after you return home from the hospital  Sit in a tub or sitz bath of COOL water for 20 minutes every hour while awake.  After the bath, carefully blot the area dry and place a piece of 100% Cotton or gauze next to the anal opening.  Change this as needed to keep the area dry.  You may sit on a covered ice pack between the baths as needed.    You should have crushed ice in small amounts until you are able to urinate.  After you urinate, you may drink fluids.  It is normal to not urinate for the first few hours after surgery.  If you become uncomfortable, call the office for instructions.   Take your pain medications as prescribed  You may eat whatever you feel like.  Start with a light meal and gradually advance your diet.    Beginning the day after your surgery  Sit in a tub of cool to warm water for 15 minutes at least 3 times a day and after bowel movements  After the bowel movement, clean with wet cotton, Tucks or baby wipes.  Apply Silvadene to a thin piece of cotton and place over the anal opening after your baths.  This will collect any drainage or bleeding.  Drainage is usually a pink to tan color and is normal for the following 2-3 weeks after surgery.  Change to cotton ever 2-3 hours while awake.  Diet  Eat a regular diet.  Avoid foods that may constipate you or give you diarrhea.  Avoid foods with seeds, nuts, corn or popcorn.  Beginning the day after surgery, drink 6-8 glasses of water a day in addition to your meals.  Limit you caffeine intake to 1-2 servings per day.  Medication  Take a fiber supplement (Metamucil, Citrucel, FiberCon) twice a  day.  Take a stool softener like Colace twice daily  Take your pain medication as directed.  You may use Extra Strength Tylenol instead of your prescribed pain medication to relieve mild discomfort.  Avoid products containing aspirin.  Bowel Habits  You should have a bowel movement at least every other day.  If you are constipated, you may take a Fleet enema or 2 Dulcolax tablets.  Call the office if no results occur.  You may bear down a normal amount to have a bowel movement without hurting your tissues after the operation.  Activity  Walking is encouraged.  You may drive when you are no longer on prescription pain medication.  You may go up and down stairs carefully.  No heavy lifting or strenuous activity until after your first post operative appointment  Do NOT sit on a rubber ring; instead use a soft pillow if needed.  Call the office if you have any questions or concerns.  Call IMMEDIATELY if you develop:  Rectal bleeding  Increasing rectal pain  Fever greater than 100 F  Difficulty urinating    Post Anesthesia Home Care Instructions  Activity: Get plenty of rest for the remainder of the day. A responsible individual must stay with you for 24 hours following the procedure.  For the next 24 hours, DO NOT: -Drive a car -  Operate machinery -Drink alcoholic beverages -Take any medication unless instructed by your physician -Make any legal decisions or sign important papers.  Meals: Start with liquid foods such as gelatin or soup. Progress to regular foods as tolerated. Avoid greasy, spicy, heavy foods. If nausea and/or vomiting occur, drink only clear liquids until the nausea and/or vomiting subsides. Call your physician if vomiting continues.  Special Instructions/Symptoms: Your throat may feel dry or sore from the anesthesia or the breathing tube placed in your throat during surgery. If this causes discomfort, gargle with warm salt water. The discomfort should  disappear within 24 hours.  If you had a scopolamine patch placed behind your ear for the management of post- operative nausea and/or vomiting:  1. The medication in the patch is effective for 72 hours, after which it should be removed.  Wrap patch in a tissue and discard in the trash. Wash hands thoroughly with soap and water. 2. You may remove the patch earlier than 72 hours if you experience unpleasant side effects which may include dry mouth, dizziness or visual disturbances. 3. Avoid touching the patch. Wash your hands with soap and water after contact with the patch.

## 2019-07-28 NOTE — Op Note (Signed)
07/28/2019  8:17 AM  PATIENT:  Chris [name].  53 y.o. male  Patient Care Team: [name], NP as PCP - General (Nurse Practitioner) Carlyle Basques, MD as PCP - Infectious Diseases (Infectious Diseases) Leonie Man, MD as PCP - Cardiology (Cardiology)  PRE-OPERATIVE DIAGNOSIS:  ANAL CONDYLOMA  POST-OPERATIVE DIAGNOSIS:  ANAL CONDYLOMA  PROCEDURE: ANAL EXAM UNDER ANESTHESIA EXCISIONAL BIOPSY OF PERIANAL CONDYLOMA AND LASER ABLATION    Surgeon(s): Leighton Ruff, MD  ASSISTANT: none   ANESTHESIA:   local and MAC  SPECIMEN:  Source of Specimen:  condyloma  DISPOSITION OF SPECIMEN:  PATHOLOGY  COUNTS:  YES  PLAN OF CARE: Discharge to home after PACU  PATIENT DISPOSITION:  PACU - hemodynamically stable.  INDICATION: 53 y.o. M with perianal condyloma   OR FINDINGS: 3 larger anterior lesion, several small perianal lesions  DESCRIPTION: the patient was identified in the preoperative holding area and taken to the OR where they were laid on the operating room table.  MAC anesthesia was induced without difficulty. The patient was then positioned in prone jackknife position with buttocks gently taped apart.  The patient was then prepped and draped in usual sterile fashion.  SCDs were noted to be in place prior to the initiation of anesthesia. A surgical timeout was performed indicating the correct patient, procedure, positioning and need for preoperative antibiotics.  A rectal block was performed using Marcaine with epinephrine.    I began by incising the larger anterior lesions with a 10 blade scalpel.  These were sent to pathology for further examination.  The dermal layer was closed using interrupted 3-0 Vicryl sutures and the skin was closed with Dermabond.   I then proceeded with a digital rectal exam.  There were no masses noted internally.  Soft stool was noted in the rectal vault.  I then placed a Hill-Ferguson anoscope into the anal canal and evaluated this  completely.  There was one lesion noted in the right lateral anal margin.  There were several other smaller lesions noted perianally.  There were no internal lesions noted.  There was scar internally consistent with previous infection.  I placed in acetic acid soaked sponge within the anal canal and one in the perianal area and allow this to sit for 2 minutes.  The laser was brought onto the field and all condylomatous lesions were ablated using the laser.  Hemostasis was good.  A dressing was applied.  The patient was then awakened from anesthesia and sent to the postanesthesia care unit in stable condition.  All counts were correct per operating room staff.

## 2019-07-28 NOTE — H&P (Signed)
The patient is a 53 year old male who presents with anal lesions. 53 year old male who presents to the office for evaluation of perianal lesions. He states this had been present for approximately 18 months and complains of occasional bleeding and pain with bowel movements. He has never had these before. Patient has HIV with a CD4 count of approximately 427 and undetectable viral load. He is anticoagulated with Elliquis for A fib. He has COPD and oxygen dependency. He also complains of some posterior scrotal lesions. He was seen by urology and they did not feel like these lesions needed to be excised.   Problem List/Past Medical  POSTOP CHECK (Z09) ANAL CONDYLOMA (A63.0)  Past Surgical History  Knee Surgery Right. Oral Surgery  Allergies  Bactrim *ANTI-INFECTIVE AGENTS - MISC.*  Medication History Atorvastatin Calcium (40MG  Tablet, Oral) Active. Eliquis (5MG  Tablet, Oral) Active. metFORMIN HCl (1000MG  Tablet, Oral) Active. Victoza (18MG /3ML Soln Pen-inj, Subcutaneous) Active. Dapsone (100MG  Tablet, Oral) Active. Lantus (100UNIT/ML Solution, Subcutaneous) Active. Lisinopril (10MG  Tablet, Oral) Active. MetFORMIN HCl (500MG  Tablet, Oral) Active. ProAir HFA (108 (90 Base)MCG/ACT Aerosol Soln, Inhalation) Active. Spiriva Respimat (2.5MCG/ACT Aerosol Soln, Inhalation) Active. Qvar (80MCG/ACT Aerosol Soln, Inhalation) Active. Triamcinolone Acetonide (0.1% Cream, External) Active. Prezista (800MG  Tablet, Oral) Active. Medications Reconciled  Social History ) Alcohol use Remotely quit alcohol use. Caffeine use Tea. Illicit drug use Remotely quit drug use. Tobacco use Current some day smoker.  Family History ) Alcohol Abuse Father. Bleeding disorder Family Members In General. Breast Cancer Sister. Hypertension Family Members In General, Father.  Other Problems  Asthma Back Pain Chronic Obstructive Lung Disease Diabetes Mellitus High  blood pressure HIV-positive     Review of Systems General Present- Weight Gain. Not Present- Appetite Loss, Chills, Fatigue, Fever, Night Sweats and Weight Loss. Skin Not Present- Change in Wart/Mole, Dryness, Hives, Jaundice, New Lesions, Non-Healing Wounds, Rash and Ulcer. HEENT Not Present- Earache, Hearing Loss, Hoarseness, Nose Bleed, Oral Ulcers, Ringing in the Ears, Seasonal Allergies, Sinus Pain, Sore Throat, Visual Disturbances, Wears glasses/contact lenses and Yellow Eyes. Cardiovascular Present- Shortness of Breath. Not Present- Chest Pain, Difficulty Breathing Lying Down, Leg Cramps, Palpitations, Rapid Heart Rate and Swelling of Extremities. Gastrointestinal Not Present- Abdominal Pain, Bloating, Bloody Stool, Change in Bowel Habits, Chronic diarrhea, Constipation, Difficulty Swallowing, Excessive gas, Gets full quickly at meals, Hemorrhoids, Indigestion, Nausea, Rectal Pain and Vomiting. Male Genitourinary Present- Nocturia. Not Present- Blood in Urine, Change in Urinary Stream, Frequency, Impotence, Painful Urination, Urgency and Urine Leakage.  BP 127/68   Temp 97.8 F (36.6 C) (Oral)   Resp 16   Ht 5\' 9"  (1.753 m)   Wt 114.1 kg   SpO2 91%   BMI 37.15 kg/m    Physical Exam   General Mental Status-Alert. General Appearance-Not in acute distress. Build & Nutrition-Well nourished. Posture-Normal posture. Gait-Normal.  Head and Neck Head-normocephalic, atraumatic with no lesions or palpable masses. Trachea-midline.  Chest and Lung Exam Chest and lung exam reveals -on auscultation, normal breath sounds, no adventitious sounds and normal vocal resonance.  Cardiovascular Cardiovascular examination reveals -normal heart sounds, regular rate and rhythm with no murmurs and no digital clubbing, cyanosis, edema, increased warmth or tenderness.  Abdomen Inspection Inspection of the abdomen reveals - No  Hernias. Palpation/Percussion Palpation and Percussion of the abdomen reveal - Soft, Non Tender, No Rigidity (guarding), No hepatosplenomegaly and No Palpable abdominal masses.  Rectal Anorectal Exam Internal - Note: No internal lesions palpated. Note: Several flat lesions with skin breakdown concerning for condyloma or possible  AIN.  Neurologic Neurologic evaluation reveals -alert and oriented x 3 with no impairment of recent or remote memory, normal attention span and ability to concentrate, normal sensation and normal coordination.  Musculoskeletal Normal Exam - Bilateral-Upper Extremity Strength Normal and Lower Extremity Strength Normal.    Assessment & Plan   ANAL CONDYLOMA (A63.0) Impression: Patient has 3 larger pedunculated lesions at anterior midline. These will require excision. He has some smaller lesions which we could ablate with the laser. We would also do an internal exam and ablate any large lesions internally. We would take biopsies of anything that seemed abnormal. We discussed typical postoperative pain and wound healing. We discussed that he is at increased risk for wound healing and complications related to this. Due to his chronic COPD and diabetes. I believe he understands this and wishes to proceed with surgery.

## 2019-07-28 NOTE — Anesthesia Procedure Notes (Signed)
Procedure Name: MAC Date/Time: 07/28/2019 7:35 AM Performed by: Justice Rocher, CRNA Pre-anesthesia Checklist: Patient identified, Timeout performed, Patient being monitored, Suction available and Emergency Drugs available Patient Re-evaluated:Patient Re-evaluated prior to induction Oxygen Delivery Method: Simple face mask Preoxygenation: Pre-oxygenation with 100% oxygen Induction Type: IV induction Placement Confirmation: breath sounds checked- equal and bilateral and positive ETCO2

## 2019-07-29 LAB — SURGICAL PATHOLOGY

## 2019-08-02 ENCOUNTER — Encounter: Payer: Self-pay | Admitting: Family Medicine

## 2019-08-02 ENCOUNTER — Other Ambulatory Visit: Payer: Self-pay

## 2019-08-02 ENCOUNTER — Ambulatory Visit: Payer: Medicare HMO | Attending: Family Medicine | Admitting: Family Medicine

## 2019-08-02 VITALS — BP 114/71 | HR 84 | Temp 98.6°F | Ht 69.0 in | Wt 252.0 lb

## 2019-08-02 DIAGNOSIS — K0889 Other specified disorders of teeth and supporting structures: Secondary | ICD-10-CM

## 2019-08-02 DIAGNOSIS — M545 Low back pain: Secondary | ICD-10-CM | POA: Diagnosis not present

## 2019-08-02 DIAGNOSIS — G8929 Other chronic pain: Secondary | ICD-10-CM | POA: Diagnosis not present

## 2019-08-02 MED ORDER — ACETAMINOPHEN-CODEINE 300-30 MG PO TABS: ORAL_TABLET | ORAL | 0 refills | Status: DC

## 2019-08-02 MED ORDER — AMOXICILLIN 500 MG PO CAPS: 500.0000 mg | ORAL_CAPSULE | Freq: Three times a day (TID) | ORAL | 0 refills | Status: DC

## 2019-08-02 NOTE — Progress Notes (Signed)
Patient has had cough runny nose and chest congestion for 1 week.  Patient is having pain in back.  Patient is having pain in upper tooth area.
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
## 2019-08-02 NOTE — Patient Instructions (Signed)
Dental Pain Dental pain may be caused by many things, including:  Tooth decay (cavities or caries).  Infection.  The inner part of the tooth being filled with pus (abscess).  Injury. Sometimes the cause of pain is unknown. Your pain can vary. It may be mild or severe. You may have it all the time, or it may occur only when you are:  Chewing.  Exposed to hot or cold temperature.  Eating or drinking sugary foods or beverages, such as soda or candy. Follow these instructions at home: Medicines  Take over-the-counter and prescription medicines only as told by your doctor.  If you were prescribed an antibiotic medicine, take it as told by your doctor. Do not stop taking the medicine even if you start to feel better. Eating and drinking  Do not eat foods or drinks that cause you pain. These include: ? Very hot or very cold foods or drinks. ? Sweet or sugary foods or drinks. Managing pain and swelling   Gargle with a salt-water mixture 3-4 times a day. To make this, dissolve -1 tsp of salt in 1 cup of warm water.  If told, put ice on the painful area of your face: ? Put ice in a plastic bag. ? Place a towel between your skin and the bag. ? Leave the ice on for 20 minutes, 2-3 times a day. Brushing your teeth  Brush your teeth twice a day using a fluoride toothpaste.  Floss your teeth once a day.  Use a toothpaste made for sensitive teeth as told by your doctor.  Use a soft toothbrush. General instructions  Do not apply heat to the outside of your face.  Watch your dental pain. Let your doctor know if there are any changes.  Keep all follow-up visits as told by your doctor. This is important. Contact a doctor if:  Your pain is not relieved by medicines.  You have new symptoms.  Your symptoms get worse. Get help right away if:  You cannot open your mouth.  You are having trouble breathing or swallowing.  You have a fever.  Your face, neck, or jaw is  swollen. Summary  Dental pain may be caused by many things, including tooth decay, injury, or infection. In some cases, the cause is not known.  Your pain may be mild or severe. You may have pain all the time, or you may have it only when you eat or drink.  Take over-the-counter and prescription medicines only as told by your doctor.  Watch your dental pain for any changes. Let your doctor know if symptoms get worse. This information is not intended to replace advice given to you by your health care provider. Make sure you discuss any questions you have with your health care provider. Document Released: 01/14/2008 Document Revised: 11/23/2018 Document Reviewed: 08/10/2017 Elsevier Patient Education  2020 Elsevier Inc.  

## 2019-08-03 ENCOUNTER — Ambulatory Visit: Payer: Medicare HMO

## 2019-08-03 ENCOUNTER — Ambulatory Visit: Payer: Medicare HMO | Admitting: Podiatry

## 2019-08-07 ENCOUNTER — Other Ambulatory Visit: Payer: Self-pay | Admitting: Family Medicine

## 2019-08-07 DIAGNOSIS — F4321 Adjustment disorder with depressed mood: Secondary | ICD-10-CM

## 2019-08-07 DIAGNOSIS — E118 Type 2 diabetes mellitus with unspecified complications: Secondary | ICD-10-CM

## 2019-08-07 DIAGNOSIS — Z794 Long term (current) use of insulin: Secondary | ICD-10-CM

## 2019-08-17 DIAGNOSIS — J129 Viral pneumonia, unspecified: Secondary | ICD-10-CM | POA: Diagnosis not present

## 2019-08-17 DIAGNOSIS — J449 Chronic obstructive pulmonary disease, unspecified: Secondary | ICD-10-CM | POA: Diagnosis not present

## 2019-08-23 ENCOUNTER — Other Ambulatory Visit: Payer: Self-pay | Admitting: Family Medicine

## 2019-08-23 DIAGNOSIS — F4321 Adjustment disorder with depressed mood: Secondary | ICD-10-CM

## 2019-08-23 DIAGNOSIS — Z794 Long term (current) use of insulin: Secondary | ICD-10-CM

## 2019-08-23 DIAGNOSIS — E118 Type 2 diabetes mellitus with unspecified complications: Secondary | ICD-10-CM

## 2019-08-23 NOTE — Telephone Encounter (Signed)
Patient had an appointment 08/02/19. Please refill if appropriate or advise if OV is still needed.

## 2019-09-06 ENCOUNTER — Other Ambulatory Visit: Payer: Self-pay | Admitting: Nurse Practitioner

## 2019-09-06 DIAGNOSIS — Z794 Long term (current) use of insulin: Secondary | ICD-10-CM

## 2019-09-06 DIAGNOSIS — E118 Type 2 diabetes mellitus with unspecified complications: Secondary | ICD-10-CM

## 2019-09-07 ENCOUNTER — Telehealth: Payer: Self-pay | Admitting: Nurse Practitioner

## 2019-09-07 NOTE — Telephone Encounter (Signed)
1) Medication(s) Requested (by name): -Acetaminophen-Codeine 300-30 MG tablet   2) Pharmacy of Choice: -[id number] - [place], [place] - [address]   3) Special Requests: -Pt still has back and knee pain

## 2019-09-08 ENCOUNTER — Other Ambulatory Visit: Payer: Self-pay | Admitting: Nurse Practitioner

## 2019-09-08 DIAGNOSIS — G8929 Other chronic pain: Secondary | ICD-10-CM

## 2019-09-08 MED ORDER — ACETAMINOPHEN-CODEINE 300-30 MG PO TABS: ORAL_TABLET | ORAL | 0 refills | Status: DC

## 2019-09-17 DIAGNOSIS — J129 Viral pneumonia, unspecified: Secondary | ICD-10-CM | POA: Diagnosis not present

## 2019-09-17 DIAGNOSIS — J449 Chronic obstructive pulmonary disease, unspecified: Secondary | ICD-10-CM | POA: Diagnosis not present

## 2019-09-22 ENCOUNTER — Other Ambulatory Visit: Payer: Self-pay

## 2019-09-22 ENCOUNTER — Ambulatory Visit: Payer: Medicare HMO | Attending: Nurse Practitioner | Admitting: Nurse Practitioner

## 2019-09-22 ENCOUNTER — Encounter: Payer: Self-pay | Admitting: Nurse Practitioner

## 2019-09-22 VITALS — BP 120/66 | HR 85 | Temp 96.0°F | Ht 69.0 in | Wt 254.0 lb

## 2019-09-22 DIAGNOSIS — E118 Type 2 diabetes mellitus with unspecified complications: Secondary | ICD-10-CM

## 2019-09-22 DIAGNOSIS — R5382 Chronic fatigue, unspecified: Secondary | ICD-10-CM | POA: Diagnosis not present

## 2019-09-22 DIAGNOSIS — J441 Chronic obstructive pulmonary disease with (acute) exacerbation: Secondary | ICD-10-CM

## 2019-09-22 DIAGNOSIS — E559 Vitamin D deficiency, unspecified: Secondary | ICD-10-CM

## 2019-09-22 DIAGNOSIS — E1142 Type 2 diabetes mellitus with diabetic polyneuropathy: Secondary | ICD-10-CM

## 2019-09-22 DIAGNOSIS — E1165 Type 2 diabetes mellitus with hyperglycemia: Secondary | ICD-10-CM

## 2019-09-22 DIAGNOSIS — R69 Illness, unspecified: Secondary | ICD-10-CM | POA: Diagnosis not present

## 2019-09-22 DIAGNOSIS — R1310 Dysphagia, unspecified: Secondary | ICD-10-CM

## 2019-09-22 DIAGNOSIS — N521 Erectile dysfunction due to diseases classified elsewhere: Secondary | ICD-10-CM | POA: Diagnosis not present

## 2019-09-22 DIAGNOSIS — F17210 Nicotine dependence, cigarettes, uncomplicated: Secondary | ICD-10-CM | POA: Diagnosis not present

## 2019-09-22 DIAGNOSIS — R1319 Other dysphagia: Secondary | ICD-10-CM

## 2019-09-22 DIAGNOSIS — I10 Essential (primary) hypertension: Secondary | ICD-10-CM

## 2019-09-22 DIAGNOSIS — E782 Mixed hyperlipidemia: Secondary | ICD-10-CM

## 2019-09-22 DIAGNOSIS — E1140 Type 2 diabetes mellitus with diabetic neuropathy, unspecified: Secondary | ICD-10-CM

## 2019-09-22 DIAGNOSIS — F17200 Nicotine dependence, unspecified, uncomplicated: Secondary | ICD-10-CM

## 2019-09-22 LAB — POCT GLYCOSYLATED HEMOGLOBIN (HGB A1C): Hemoglobin A1C: 6.3 % — AB (ref 4.0–5.6)

## 2019-09-22 LAB — GLUCOSE, POCT (MANUAL RESULT ENTRY): POC Glucose: 128 mg/dl — AB (ref 70–99)

## 2019-09-22 MED ORDER — ONETOUCH VERIO VI STRP: ORAL_STRIP | 12 refills | Status: DC

## 2019-09-22 MED ORDER — ONETOUCH DELICA LANCETS 33G MISC: 6 refills | Status: AC

## 2019-09-22 MED ORDER — SILDENAFIL CITRATE 50 MG PO TABS: 50.0000 mg | ORAL_TABLET | Freq: Every day | ORAL | 10 refills | Status: DC | PRN

## 2019-09-22 MED ORDER — LISINOPRIL 2.5 MG PO TABS: 2.5000 mg | ORAL_TABLET | Freq: Every day | ORAL | 3 refills | Status: DC

## 2019-09-22 MED ORDER — METFORMIN HCL 1000 MG PO TABS: 1000.0000 mg | ORAL_TABLET | Freq: Two times a day (BID) | ORAL | 0 refills | Status: DC
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
## 2019-09-23 LAB — CMP14+EGFR
ALT: 15 IU/L (ref 0–44)
AST: 16 IU/L (ref 0–40)
Albumin/Globulin Ratio: 1.2 (ref 1.2–2.2)
Albumin: 4.4 g/dL (ref 3.8–4.9)
Alkaline Phosphatase: 62 IU/L (ref 39–117)
BUN/Creatinine Ratio: 13 (ref 9–20)
BUN: 19 mg/dL (ref 6–24)
Bilirubin Total: 0.3 mg/dL (ref 0.0–1.2)
CO2: 23 mmol/L (ref 20–29)
Calcium: 9.7 mg/dL (ref 8.7–10.2)
Chloride: 100 mmol/L (ref 96–106)
Creatinine, Ser: 1.49 mg/dL — ABNORMAL HIGH (ref 0.76–1.27)
GFR calc Af Amer: 61 mL/min/{1.73_m2} (ref >59)
GFR calc non Af Amer: 53 mL/min/{1.73_m2} — ABNORMAL LOW (ref >59)
Globulin, Total: 3.7 g/dL (ref 1.5–4.5)
Glucose: 104 mg/dL — ABNORMAL HIGH (ref 65–99)
Potassium: 4.8 mmol/L (ref 3.5–5.2)
Sodium: 138 mmol/L (ref 134–144)
Total Protein: 8.1 g/dL (ref 6.0–8.5)

## 2019-09-23 LAB — CBC
Hematocrit: 44.6 % (ref 37.5–51.0)
Hemoglobin: 14.1 g/dL (ref 13.0–17.7)
MCH: 27.3 pg (ref 26.6–33.0)
MCHC: 31.6 g/dL (ref 31.5–35.7)
MCV: 86 fL (ref 79–97)
Platelets: 281 10*3/uL (ref 150–450)
RBC: 5.17 x10E6/uL (ref 4.14–5.80)
RDW: 13.1 % (ref 11.6–15.4)
WBC: 8.3 10*3/uL (ref 3.4–10.8)

## 2019-09-23 LAB — LIPID PANEL
Chol/HDL Ratio: 7.6 ratio — ABNORMAL HIGH (ref 0.0–5.0)
Cholesterol, Total: 244 mg/dL — ABNORMAL HIGH (ref 100–199)
HDL: 32 mg/dL — ABNORMAL LOW (ref >39)
LDL Chol Calc (NIH): 180 mg/dL — ABNORMAL HIGH (ref 0–99)
Triglycerides: 169 mg/dL — ABNORMAL HIGH (ref 0–149)
VLDL Cholesterol Cal: 32 mg/dL (ref 5–40)

## 2019-09-23 LAB — TSH: TSH: 1.19 u[IU]/mL (ref 0.450–4.500)

## 2019-09-23 LAB — B12 AND FOLATE PANEL
Folate: 9.2 ng/mL (ref >3.0)
Vitamin B-12: 688 pg/mL (ref 232–1245)

## 2019-09-23 LAB — VITAMIN D 25 HYDROXY (VIT D DEFICIENCY, FRACTURES): Vit D, 25-Hydroxy: 6.9 ng/mL — ABNORMAL LOW (ref 30.0–100.0)

## 2019-09-25 ENCOUNTER — Other Ambulatory Visit: Payer: Self-pay | Admitting: Nurse Practitioner

## 2019-09-25 MED ORDER — VITAMIN D (ERGOCALCIFEROL) 1.25 MG (50000 UNIT) PO CAPS: 50000.0000 [IU] | ORAL_CAPSULE | ORAL | 1 refills | Status: DC

## 2019-09-28 ENCOUNTER — Ambulatory Visit: Payer: Medicare HMO | Admitting: Physician Assistant

## 2019-10-02 ENCOUNTER — Encounter: Payer: Self-pay | Admitting: Nurse Practitioner

## 2019-10-03 ENCOUNTER — Encounter: Payer: Self-pay | Admitting: Nurse Practitioner

## 2019-10-06 ENCOUNTER — Other Ambulatory Visit (HOSPITAL_COMMUNITY)
Admission: RE | Admit: 2019-10-06 | Discharge: 2019-10-06 | Disposition: A | Discharge status: Home / Self Care | Payer: Medicare HMO | Source: Ambulatory Visit | Attending: Internal Medicine | Admitting: Internal Medicine

## 2019-10-06 DIAGNOSIS — Z20822 Contact with and (suspected) exposure to covid-19: Secondary | ICD-10-CM | POA: Diagnosis not present

## 2019-10-06 DIAGNOSIS — Z01812 Encounter for preprocedural laboratory examination: Secondary | ICD-10-CM | POA: Diagnosis not present

## 2019-10-06 LAB — SARS CORONAVIRUS 2 (TAT 6-24 HRS): SARS Coronavirus 2: NEGATIVE

## 2019-10-07 ENCOUNTER — Telehealth: Payer: Self-pay | Admitting: Nurse Practitioner

## 2019-10-07 NOTE — Telephone Encounter (Signed)
Pt called and stated he is out of his medication needed for pain. [facility] [address]  Acetaminophen-Codeine (Tab) Acetaminophen-Codeine 300-30 MG

## 2019-10-09 ENCOUNTER — Ambulatory Visit (HOSPITAL_BASED_OUTPATIENT_CLINIC_OR_DEPARTMENT_OTHER): Payer: Medicare HMO | Attending: Nurse Practitioner | Admitting: Internal Medicine

## 2019-10-11 NOTE — Telephone Encounter (Signed)
Will route to PCP 

## 2019-10-11 NOTE — Telephone Encounter (Signed)
Patient would like a call back when [facility] [address].

## 2019-10-12 ENCOUNTER — Other Ambulatory Visit: Payer: Self-pay | Admitting: Nurse Practitioner

## 2019-10-12 DIAGNOSIS — G8929 Other chronic pain: Secondary | ICD-10-CM

## 2019-10-13 ENCOUNTER — Other Ambulatory Visit: Payer: Self-pay | Admitting: Nurse Practitioner

## 2019-10-13 DIAGNOSIS — E118 Type 2 diabetes mellitus with unspecified complications: Secondary | ICD-10-CM

## 2019-10-13 DIAGNOSIS — F4321 Adjustment disorder with depressed mood: Secondary | ICD-10-CM

## 2019-10-13 NOTE — Telephone Encounter (Signed)
Acetaminophen-Codeine 300-30mg  tab

## 2019-10-13 NOTE — Telephone Encounter (Signed)
Patient called and requested for a call from pcp nurse. Please follow up at your earliest convenience.

## 2019-10-13 NOTE — Telephone Encounter (Signed)
Patient called and requested for listed medication to be refilled. Please follow up at your earliest convenience.

## 2019-10-14 ENCOUNTER — Other Ambulatory Visit: Payer: Self-pay

## 2019-10-14 ENCOUNTER — Encounter: Payer: Self-pay | Admitting: Podiatry

## 2019-10-14 ENCOUNTER — Ambulatory Visit (INDEPENDENT_AMBULATORY_CARE_PROVIDER_SITE_OTHER): Payer: Medicare HMO | Admitting: Podiatry

## 2019-10-14 DIAGNOSIS — M2041 Other hammer toe(s) (acquired), right foot: Secondary | ICD-10-CM | POA: Diagnosis not present

## 2019-10-14 DIAGNOSIS — E1142 Type 2 diabetes mellitus with diabetic polyneuropathy: Secondary | ICD-10-CM

## 2019-10-14 DIAGNOSIS — E119 Type 2 diabetes mellitus without complications: Secondary | ICD-10-CM

## 2019-10-14 DIAGNOSIS — L84 Corns and callosities: Secondary | ICD-10-CM | POA: Diagnosis not present

## 2019-10-14 DIAGNOSIS — L853 Xerosis cutis: Secondary | ICD-10-CM

## 2019-10-14 DIAGNOSIS — B351 Tinea unguium: Secondary | ICD-10-CM | POA: Diagnosis not present

## 2019-10-14 DIAGNOSIS — M79674 Pain in right toe(s): Secondary | ICD-10-CM

## 2019-10-14 DIAGNOSIS — M2042 Other hammer toe(s) (acquired), left foot: Secondary | ICD-10-CM | POA: Diagnosis not present

## 2019-10-14 DIAGNOSIS — M79675 Pain in left toe(s): Secondary | ICD-10-CM | POA: Diagnosis not present

## 2019-10-14 MED ORDER — AMMONIUM LACTATE 12 % EX LOTN: 1.0000 "application " | TOPICAL_LOTION | Freq: Two times a day (BID) | CUTANEOUS | 2 refills | Status: DC

## 2019-10-14 NOTE — Telephone Encounter (Signed)
Will route to PCP for medication refill request.

## 2019-10-14 NOTE — Patient Instructions (Addendum)
Apply your Am-Lactin Lotion and use Vaseline Petroleum Jelly after the Am-Lactin application.  Diabetes Mellitus and Foot Care Foot care is an important part of your health, especially when you have diabetes. Diabetes may cause you to have problems because of poor blood flow (circulation) to your feet and legs, which can cause your skin to:  Become thinner and drier.  Break more easily.  Heal more slowly.  Peel and crack. You may also have nerve damage (neuropathy) in your legs and feet, causing decreased feeling in them. This means that you may not notice minor injuries to your feet that could lead to more serious problems. Noticing and addressing any potential problems early is the best way to prevent future foot problems. How to care for your feet Foot hygiene  Wash your feet daily with warm water and mild soap. Do not use hot water. Then, pat your feet and the areas between your toes until they are completely dry. Do not soak your feet as this can dry your skin.  Trim your toenails straight across. Do not dig under them or around the cuticle. File the edges of your nails with an emery board or nail file.  Apply a moisturizing lotion or petroleum jelly to the skin on your feet and to dry, brittle toenails. Use lotion that does not contain alcohol and is unscented. Do not apply lotion between your toes. Shoes and socks  Wear clean socks or stockings every day. Make sure they are not too tight. Do not wear knee-high stockings since they may decrease blood flow to your legs.  Wear shoes that fit properly and have enough cushioning. Always look in your shoes before you put them on to be sure there are no objects inside.  To break in new shoes, wear them for just a few hours a day. This prevents injuries on your feet. Wounds, scrapes, corns, and calluses  Check your feet daily for blisters, cuts, bruises, sores, and redness. If you cannot see the bottom of your feet, use a mirror or ask  someone for help.  Do not cut corns or calluses or try to remove them with medicine.  If you find a minor scrape, cut, or break in the skin on your feet, keep it and the skin around it clean and dry. You may clean these areas with mild soap and water. Do not clean the area with peroxide, alcohol, or iodine.  If you have a wound, scrape, corn, or callus on your foot, look at it several times a day to make sure it is healing and not infected. Check for: ? Redness, swelling, or pain. ? Fluid or blood. ? Warmth. ? Pus or a bad smell. General instructions  Do not cross your legs. This may decrease blood flow to your feet.  Do not use heating pads or hot water bottles on your feet. They may burn your skin. If you have lost feeling in your feet or legs, you may not know this is happening until it is too late.  Protect your feet from hot and cold by wearing shoes, such as at the beach or on hot pavement.  Schedule a complete foot exam at least once a year (annually) or more often if you have foot problems. If you have foot problems, report any cuts, sores, or bruises to your health care provider immediately. Contact a health care provider if:  You have a medical condition that increases your risk of infection and you have any cuts,  sores, or bruises on your feet.  You have an injury that is not healing.  You have redness on your legs or feet.  You feel burning or tingling in your legs or feet.  You have pain or cramps in your legs and feet.  Your legs or feet are numb.  Your feet always feel cold.  You have pain around a toenail. Get help right away if:  You have a wound, scrape, corn, or callus on your foot and: ? You have pain, swelling, or redness that gets worse. ? You have fluid or blood coming from the wound, scrape, corn, or callus. ? Your wound, scrape, corn, or callus feels warm to the touch. ? You have pus or a bad smell coming from the wound, scrape, corn, or  callus. ? You have a fever. ? You have a red line going up your leg. Summary  Check your feet every day for cuts, sores, red spots, swelling, and blisters.  Moisturize feet and legs daily.  Wear shoes that fit properly and have enough cushioning.  If you have foot problems, report any cuts, sores, or bruises to your health care provider immediately.  Schedule a complete foot exam at least once a year (annually) or more often if you have foot problems. This information is not intended to replace advice given to you by your health care provider. Make sure you discuss any questions you have with your health care provider. Document Revised: 04/20/2019 Document Reviewed: 08/29/2016 Elsevier Patient Education  [place].

## 2019-10-15 NOTE — Telephone Encounter (Signed)
sent 

## 2019-10-18 ENCOUNTER — Ambulatory Visit: Payer: Medicare HMO | Attending: Nurse Practitioner | Admitting: Nurse Practitioner

## 2019-10-18 ENCOUNTER — Other Ambulatory Visit: Payer: Self-pay

## 2019-10-18 ENCOUNTER — Encounter: Payer: Self-pay | Admitting: Nurse Practitioner

## 2019-10-18 VITALS — BP 102/63 | HR 100 | Temp 97.7°F | Ht 69.0 in | Wt 256.0 lb

## 2019-10-18 DIAGNOSIS — F17210 Nicotine dependence, cigarettes, uncomplicated: Secondary | ICD-10-CM

## 2019-10-18 DIAGNOSIS — Z0000 Encounter for general adult medical examination without abnormal findings: Secondary | ICD-10-CM | POA: Diagnosis not present

## 2019-10-18 DIAGNOSIS — F17200 Nicotine dependence, unspecified, uncomplicated: Secondary | ICD-10-CM

## 2019-10-18 DIAGNOSIS — E118 Type 2 diabetes mellitus with unspecified complications: Secondary | ICD-10-CM

## 2019-10-18 DIAGNOSIS — I10 Essential (primary) hypertension: Secondary | ICD-10-CM | POA: Diagnosis not present

## 2019-10-18 DIAGNOSIS — R69 Illness, unspecified: Secondary | ICD-10-CM | POA: Diagnosis not present

## 2019-10-18 DIAGNOSIS — E119 Type 2 diabetes mellitus without complications: Secondary | ICD-10-CM

## 2019-10-18 LAB — GLUCOSE, POCT (MANUAL RESULT ENTRY): POC Glucose: 150 mg/dl — AB (ref 70–99)

## 2019-10-18 NOTE — Telephone Encounter (Signed)
Confirmed with patient. Pt. Picked up his medication.

## 2019-10-19 ENCOUNTER — Ambulatory Visit: Payer: Medicare HMO | Admitting: Nurse Practitioner

## 2019-10-20 NOTE — Progress Notes (Signed)
Subjective: Chris [name]. presents today for follow up of preventative diabetic foot care and painful mycotic nails b/l that are difficult to trim. Pain interferes with ambulation. Aggravating factors include wearing enclosed shoe gear. Pain is relieved with periodic professional debridement.   He does have numbness of feet.   Allergies  Allergen Reactions  . Bactrim [Sulfamethoxazole-Trimethoprim] Hives    Objective: There were no vitals filed for this visit.  Pt 54 y.o. year old AA  male, morbidly obese in NAD. AAO x 3.   Vascular Examination:  Capillary refill time to digits immediate b/l. Palpable DP pulses b/l. Palpable PT pulses b/l. Pedal hair absent b/l Skin temperature gradient within normal limits b/l.  Dermatological Examination: Pedal skin with normal turgor, texture and tone bilaterally. No open wounds bilaterally. No interdigital macerations bilaterally. Toenails 1-5 b/l elongated, dystrophic, thickened, crumbly with subungual debris and tenderness to dorsal palpation. Hyperkeratotic lesion(s) b/l hallux and submet head 4 left foot.  No erythema, no edema, no drainage, no flocculence.  Musculoskeletal: Normal muscle strength 5/5 to all lower extremity muscle groups bilaterally, no pain crepitus or joint limitation noted with ROM b/l and hammertoes noted to the  L 4th toe, L 5th toe, R 4th toe and R 5th toe  Neurological: Protective sensation intact 5/5 intact bilaterally with 10g monofilament b/l Vibratory sensation intact b/l  Assessment: 1. Pain due to onychomycosis of toenails of both feet   2. Callus   3. Xerosis cutis   4. Diabetic peripheral neuropathy associated with type 2 diabetes mellitus (HCC)   5. Acquired hammertoes of both feet   6. Encounter for diabetic foot exam (HCC)    Plan: -Diabetic foot examination performed on today's visit. -Continue diabetic foot care principles. Literature dispensed on today.  -Toenails 1-5 b/l were debrided in length  and girth with sterile nail nippers and dremel without iatrogenic bleeding.  -Calluses submet head 4 left foot and b/l hallux were debrided without complication or incident. Total number debrided =3. -Patient to continue soft, supportive shoe gear daily. -Patient to report any pedal injuries to medical professional immediately. -Rx sent to pharmacy for AmLactin Lotion 12% to be applied to feet twice daily for xerosis. He is to apply Vaseline Petroleum Jelly after AmLactin Application. -Patient/POA to call should there be question/concern in the interim.  Return in about 3 months (around 01/14/2020) for diabetic nail and callus trim.

## 2019-10-26 ENCOUNTER — Encounter: Payer: Self-pay | Admitting: Nurse Practitioner

## 2019-10-26 ENCOUNTER — Ambulatory Visit (INDEPENDENT_AMBULATORY_CARE_PROVIDER_SITE_OTHER): Payer: Medicare HMO | Admitting: Nurse Practitioner

## 2019-10-26 ENCOUNTER — Other Ambulatory Visit: Payer: Self-pay

## 2019-10-26 VITALS — BP 130/80 | HR 80 | Temp 97.2°F | Ht 69.0 in | Wt 257.2 lb

## 2019-10-26 DIAGNOSIS — R1310 Dysphagia, unspecified: Secondary | ICD-10-CM | POA: Diagnosis not present

## 2019-10-26 MED ORDER — FLUCONAZOLE 100 MG PO TABS: ORAL_TABLET | ORAL | 0 refills | Status: DC

## 2019-10-26 MED ORDER — OMEPRAZOLE 40 MG PO CPDR: 40.0000 mg | DELAYED_RELEASE_CAPSULE | Freq: Every morning | ORAL | 1 refills | Status: DC
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
## 2019-10-26 NOTE — Patient Instructions (Addendum)
If you are age 54 or older, your body mass index should be between 23-30. Your Body mass index is 37.98 kg/m. If this is out of the aforementioned range listed, please consider follow up with your Primary Care Provider.  If you are age 52 or younger, your body mass index should be between 19-25. Your Body mass index is 37.98 kg/m. If this is out of the aformentioned range listed, please consider follow up with your Primary Care Provider.   START Omeprazole 40 mg 1 capsule every morning prior to breakfast START Fluconazole 100 mg Take 2 tablets today, then 1 tablet daily for 6 days  Follow up in 2 weeks.

## 2019-10-26 NOTE — Progress Notes (Signed)
Assessment and plan reviewed 

## 2019-10-31 ENCOUNTER — Other Ambulatory Visit: Payer: Self-pay | Admitting: Nurse Practitioner

## 2019-10-31 DIAGNOSIS — E118 Type 2 diabetes mellitus with unspecified complications: Secondary | ICD-10-CM

## 2019-11-05 ENCOUNTER — Encounter: Payer: Self-pay | Admitting: Nurse Practitioner
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
## 2019-11-10 ENCOUNTER — Telehealth: Payer: Self-pay | Admitting: Nurse Practitioner

## 2019-11-10 DIAGNOSIS — J449 Chronic obstructive pulmonary disease, unspecified: Secondary | ICD-10-CM | POA: Diagnosis not present

## 2019-11-10 DIAGNOSIS — J129 Viral pneumonia, unspecified: Secondary | ICD-10-CM | POA: Diagnosis not present

## 2019-11-10 NOTE — Telephone Encounter (Signed)
1) Medication(s) Requested (by name): Tylenol # 3   2) Pharmacy of Choice: [id number] - [place], [place] - [address]  [address], [place] [address]   3) Special Requests:   Approved medications will be sent to the pharmacy, we will reach out if there is an issue.  Requests made after 3pm may not be addressed until the following business day!  If a patient is unsure of the name of the medication(s) please note and ask patient to call back when they are able to provide all info, do not send to responsible party until all information is available!
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
## 2019-11-13 ENCOUNTER — Other Ambulatory Visit: Payer: Self-pay | Admitting: Nurse Practitioner

## 2019-11-13 DIAGNOSIS — M545 Low back pain, unspecified: Secondary | ICD-10-CM

## 2019-11-13 DIAGNOSIS — G8929 Other chronic pain: Secondary | ICD-10-CM

## 2019-11-13 MED ORDER — ACETAMINOPHEN-CODEINE 300-30 MG PO TABS: 1.0000 | ORAL_TABLET | Freq: Three times a day (TID) | ORAL | 0 refills | Status: DC | PRN

## 2019-11-14 ENCOUNTER — Telehealth: Payer: Self-pay | Admitting: Internal Medicine

## 2019-11-14 ENCOUNTER — Ambulatory Visit: Payer: Medicare HMO | Admitting: Adult Health

## 2019-11-14 NOTE — Telephone Encounter (Signed)
Called and spoke to pt. Pt states he is using his Bevespi 4 times per day and his albuterol hfa 4 times per day due to his increased SOB. Advised pt of the correct frequency to take both medication. Pt states his SOB has been worse than baseline for 3 weeks, pt also c/o mild chest tightness when SOB. Pt denies cough, f/c/s, swelling. Pt denies having a pulse/ox. Pt states he has recently needed to wear his O2 24/7, pt states he normally only wears his O2 as needed. However, when looking in pt's chart pt has needed O2 at least with rest and activity since 2018, not just as needed. Advised pt of this. Appt has been made for a video visit with Elisha Headland, NP, on 4/6 due to his SOB. Pt is aware to seek emergency care if his SOB worsens, or any development of CP, fever, swelling, lethargy. Pt states he feels well enough to wait for virtual visit on 4/6. Nothing further needed at this time.    Will forward to [place] as FYI.

## 2019-11-14 NOTE — Telephone Encounter (Signed)
Noted.   Avianna Moynahan FNP  

## 2019-11-15 ENCOUNTER — Telehealth (INDEPENDENT_AMBULATORY_CARE_PROVIDER_SITE_OTHER): Payer: Medicare HMO | Admitting: Pulmonary Disease

## 2019-11-15 ENCOUNTER — Encounter: Payer: Self-pay | Admitting: Pulmonary Disease

## 2019-11-15 ENCOUNTER — Ambulatory Visit: Payer: Medicare HMO | Admitting: Primary Care

## 2019-11-15 DIAGNOSIS — J449 Chronic obstructive pulmonary disease, unspecified: Secondary | ICD-10-CM

## 2019-11-15 DIAGNOSIS — J9612 Chronic respiratory failure with hypercapnia: Secondary | ICD-10-CM

## 2019-11-15 DIAGNOSIS — J9611 Chronic respiratory failure with hypoxia: Secondary | ICD-10-CM

## 2019-11-15 DIAGNOSIS — B20 Human immunodeficiency virus [HIV] disease: Secondary | ICD-10-CM

## 2019-11-15 DIAGNOSIS — B3781 Candidal esophagitis: Secondary | ICD-10-CM

## 2019-11-15 DIAGNOSIS — R0602 Shortness of breath: Secondary | ICD-10-CM

## 2019-11-15 DIAGNOSIS — R69 Illness, unspecified: Secondary | ICD-10-CM | POA: Diagnosis not present

## 2019-11-15 NOTE — Progress Notes (Signed)
Virtual Visit via Video Note  I connected with Chris [name]. on 11/15/19 at 12:00 PM EDT by a video enabled telemedicine application and verified that I am speaking with the correct person using two identifiers.  Location: Patient: Home Provider: Office - [facility] - [address] [place], [address], [place], [place] [address]  I discussed the limitations of evaluation and management by telemedicine and the availability of in person appointments. The patient expressed understanding and agreed to proceed. I also discussed with the patient that there may be a patient responsible charge related to this service. The patient expressed understanding and agreed to proceed.  Patient consented to consult via telephone: Yes People present and their role in pt care: Pt   History of Present Illness:  54 year old male current smoker followed in our office for COPD  Past medical history: Hypertension, diastolic heart failure, HIV, dysphagia, morbid obesity, GERD, depression, type 2 diabetes, Smoking history: Current smoker Maintenance: Bevespi Patient of Dr. Sherene Sires  Chief complaint:    54 year old male current smoker followed in our office for COPD and chronic respiratory failure.  Patient contacted our office yesterday to report increased trouble breathing.  Mild chest tightness.  He also is taking his medications incorrectly.  Patient is taking Bevespi 4 times per day and albuterol 4 times per day.  Due to the increased shortness of breath.  Patient was last seen in our office in October/2020.  Patient completing video visit with our office today.  Unfortunately unable to see the patient on video due to patient struggling with user interface a virtual visit.  Patient reports that he had worsened shortness of breath over the last 3 to 4 weeks.  He denies chest pain or wheezing.  Denies increased cough or congestion.  He was recently evaluated by gastroenterology and diagnosed with thrush.  He was  prescribed fluconazole.  Patient with known HIV.  He needs follow-up with infectious disease.  He reports he will call them today.  Patient remains adherent with Bevespi as well as his rescue inhaler.  Unfortunately patient was increasing his Bevespi use in order to help with his shortness of breath.  He was counseled by our triage team to not do that.   Observations/Objective:  01/21/2018-CT chest without contrast-moderate centrilobular emphysema, multifocal pneumonia and bronchiolitis, subcentimeter right mainstem bronchus debris versus nodule  03/22/2018-pulmonary function test-FVC 1.69 (45% predicted), postbronchodilator ratio 63, postbronchodilator FEV1 1.34 (45% predicted), positive bronchodilator response, mid flow reversibility, DLCO 10.39 (36% predicted) Patient use Symbicort 160 and increase before testing   Social History   Tobacco Use  Smoking Status Current Some Day Smoker  . Packs/day: 0.50  . Years: 40.00  . Pack years: 20.00  . Types: Cigarettes  Smokeless Tobacco Never Used  Tobacco Comment   occ smoker   Immunization History  Administered Date(s) Administered  . Hepatitis A, Adult 03/03/2013, 12/21/2014  . Influenza Split 03/11/2013  . Influenza Whole 05/11/2012  . Influenza,inj,Quad PF,6+ Mos 04/20/2014, 06/18/2015, 06/23/2016, 05/19/2017, 06/22/2018  . Meningococcal Mcv4o 11/13/2016, 06/22/2018  . PPD Test 03/16/2013  . Pneumococcal Conjugate-13 03/03/2018  . Pneumococcal Polysaccharide-23 05/11/2012, 12/07/2018    Assessment and Plan:  Chronic respiratory failure with hypoxia and hypercapnia (HCC) Plan: We will schedule in person evaluation on 11/17/2019 Patient would likely benefit from a walk We will order chest x-ray Further evaluate oxygen levels May need lab work  COPD  GOLD III, still smoking Plan: Emphasized importance of the patient to stop smoking Continue Bevespi Present  to our office on 11/17/2019 for further in person evaluation We will  hold off on prednisone especially given esophageal candidiasis being treated by gastroenterology and patient having no audible wheezing or cough on video visit today We will order chest x-ray to be completed on 11/17/2019 Patient would also likely benefit from a walk in office Patient to bring all medications with him  Candida esophagitis ([place]) Plan:  Continue to follow up with GI  Take Fluconazole   HIV (human immunodeficiency virus infection) ([place]) Plan:  Schedule appt with ID   Shortness of breath Plan:  Chest xray on 4/8 Appt made on 4/8  Needs walk in office    Follow Up Instructions:  Return in about 2 days (around 11/17/2019), or if symptoms worsen or fail to improve, for Follow up with [name], With Chest Xray.    I discussed the assessment and treatment plan with the patient. The patient was provided an opportunity to ask questions and all were answered. The patient agreed with the plan and demonstrated an understanding of the instructions.   The patient was advised to call back or seek an in-person evaluation if the symptoms worsen or if the condition fails to improve as anticipated.  I provided 24 minutes of non-face-to-face time during this encounter.   Lauraine Rinne, NP

## 2019-11-15 NOTE — Assessment & Plan Note (Signed)
Plan:  Continue to follow up with GI  Take Fluconazole

## 2019-11-15 NOTE — Assessment & Plan Note (Signed)
Plan: Emphasized importance of the patient to stop smoking Continue Bevespi Present to our office on 11/17/2019 for further in person evaluation We will hold off on prednisone especially given esophageal candidiasis being treated by gastroenterology and patient having no audible wheezing or cough on video visit today We will order chest x-ray to be completed on 11/17/2019 Patient would also likely benefit from a walk in office Patient to bring all medications with him

## 2019-11-15 NOTE — Assessment & Plan Note (Signed)
Plan: We will schedule in person evaluation on 11/17/2019 Patient would likely benefit from a walk We will order chest x-ray Further evaluate oxygen levels May need lab work

## 2019-11-15 NOTE — Patient Instructions (Addendum)
You were seen today by Coral Ceo, NP  for:  1. Shortness of breath  We will see you in office on 11/17/2019 at 9:45 AM  I will order a chest x-ray  You will see Dr. Sherene Sires  If symptoms worsen prior to this please present to the emergency room for further evaluation  2. COPD  GOLD III, still smoking  Emphasized the importance of the patient stop smoking  Continue Bevespi 2 puffs taken twice daily  Can use rescue inhaler every 6 hours as needed for shortness of breath or wheezing  Present to our office this week with a chest x-ray for further evaluation  If symptoms worsen please present to an emergency room  3. Chronic respiratory failure with hypoxia and hypercapnia (HCC)  We will schedule an in office visit this week for further evaluation of your shortness of breath as well as oxygen levels  Continue to maintain oxygen saturations  4. Candida esophagitis (HCC)  Take fluconazole as all as prescribed  Keep follow-up with gastroenterology  5. HIV infection, unspecified symptom status (HCC)  Schedule follow-up with infectious disease  Follow Up:    Return in about 2 days (around 11/17/2019), or if symptoms worsen or fail to improve, for Follow up with Dr. Sherene Sires, With Chest Xray.   Please do your part to reduce the spread of COVID-19:      Reduce your risk of any infection  and COVID19 by using the similar precautions used for avoiding the common cold or flu:  [name] Wash your hands often with soap and warm water for at least 20 seconds.  If soap and water are not readily available, use an alcohol-based hand sanitizer with at least 60% alcohol.  . If coughing or sneezing, cover your mouth and nose by coughing or sneezing into the elbow areas of your shirt or coat, into a tissue or into your sleeve (not your hands). Drinda Butts A MASK when in public  . Avoid shaking hands with others and consider head nods or verbal greetings only. . Avoid touching your eyes, nose, or mouth with  unwashed hands.  . Avoid close contact with people who are sick. . Avoid places or events with large numbers of people in one location, like concerts or sporting events. . If you have some symptoms but not all symptoms, continue to monitor at home and seek medical attention if your symptoms worsen. . If you are having a medical emergency, call 911.   ADDITIONAL HEALTHCARE OPTIONS FOR PATIENTS  [facility] / e-Visit: [email or web address]         [facility]: [phone]  [facility]: [phone]                   [facility]: [phone]     It is flu season:   >>> Best ways to protect herself from the flu: Receive the yearly flu vaccine, practice good hand hygiene washing with soap and also using hand sanitizer when available, eat a nutritious meals, get adequate rest, hydrate appropriately   Please contact the office if your symptoms worsen or you have concerns that you are not improving.   Thank you for choosing [facility] for your healthcare, and for allowing [place] to partner with you on your healthcare journey. I am thankful to be able to provide care to you today.   Elisha Headland FNP-C

## 2019-11-15 NOTE — Assessment & Plan Note (Signed)
Plan:  Schedule appt with ID

## 2019-11-15 NOTE — Assessment & Plan Note (Signed)
Plan:  Chest xray on 4/8 Appt made on 4/8  Needs walk in office

## 2019-11-16 ENCOUNTER — Ambulatory Visit: Payer: Medicare HMO | Admitting: Nurse Practitioner

## 2019-11-17 ENCOUNTER — Encounter: Payer: Self-pay | Admitting: Internal Medicine

## 2019-11-17 ENCOUNTER — Ambulatory Visit (INDEPENDENT_AMBULATORY_CARE_PROVIDER_SITE_OTHER): Payer: Medicare HMO | Admitting: Internal Medicine

## 2019-11-17 ENCOUNTER — Ambulatory Visit (INDEPENDENT_AMBULATORY_CARE_PROVIDER_SITE_OTHER): Payer: Medicare HMO

## 2019-11-17 ENCOUNTER — Other Ambulatory Visit: Payer: Self-pay

## 2019-11-17 DIAGNOSIS — R69 Illness, unspecified: Secondary | ICD-10-CM | POA: Diagnosis not present

## 2019-11-17 DIAGNOSIS — J9612 Chronic respiratory failure with hypercapnia: Secondary | ICD-10-CM

## 2019-11-17 DIAGNOSIS — I10 Essential (primary) hypertension: Secondary | ICD-10-CM

## 2019-11-17 DIAGNOSIS — J9611 Chronic respiratory failure with hypoxia: Secondary | ICD-10-CM | POA: Diagnosis not present

## 2019-11-17 DIAGNOSIS — R0602 Shortness of breath: Secondary | ICD-10-CM

## 2019-11-17 DIAGNOSIS — F17210 Nicotine dependence, cigarettes, uncomplicated: Secondary | ICD-10-CM | POA: Diagnosis not present

## 2019-11-17 DIAGNOSIS — J449 Chronic obstructive pulmonary disease, unspecified: Secondary | ICD-10-CM

## 2019-11-17 MED ORDER — BREZTRI AEROSPHERE 160-9-4.8 MCG/ACT IN AERO: 2.0000 | INHALATION_SPRAY | Freq: Two times a day (BID) | RESPIRATORY_TRACT | Status: DC

## 2019-11-17 MED ORDER — PREDNISONE 10 MG PO TABS: ORAL_TABLET | ORAL | 0 refills | Status: DC

## 2019-11-17 MED ORDER — BREZTRI AEROSPHERE 160-9-4.8 MCG/ACT IN AERO: 2.0000 | INHALATION_SPRAY | Freq: Two times a day (BID) | RESPIRATORY_TRACT | 0 refills | Status: DC

## 2019-11-17 MED ORDER — LOSARTAN POTASSIUM 25 MG PO TABS: 25.0000 mg | ORAL_TABLET | Freq: Every day | ORAL | 11 refills | Status: DC

## 2019-11-17 NOTE — Assessment & Plan Note (Signed)
4-5 min discussion re active cigarette smoking in addition to office E&M  Ask about tobacco use:   ongoing Advise quitting   I emphasized that although we never turn away smokers from the pulmonary clinic, we do ask that they understand that the recommendations that we make  won't work nearly as well in the presence of continued cigarette exposure. In fact, we may very well  reach a point where we can't promise to help the patient if he/she can't quit smoking. (We can and will promise to try to help, we just can't promise what we recommend will really work)  Assess willingness:  Not committed at this point Assist in quit attempt:  Per PCP when ready Arrange follow up:   Follow up per Primary Care planned  For smoking cessation info  call [phone]      

## 2019-11-17 NOTE — Assessment & Plan Note (Signed)
D/c acei 11/17/2019  Due to pseudowheeze  In the best review of chronic cough to date ( NEJM 2016 375 8099-8338) ,  ACEi are now felt to cause cough in up to  20% of pts which is a 4 fold increase from previous reports and does not include the variety of non-specific complaints we see in pulmonary clinic in pts on ACEi but previously attributed to another dx like  Copd/asthma and  include PNDS, throat and chest congestion, "bronchitis", unexplained dyspnea and noct "strangling" sensations, and hoarseness, but also  atypical /refractory GERD symptoms like dysphagia and "bad heartburn"   The only way I know  to prove this is not an "ACEi Case" is a trial off ACEi x a minimum of 6 weeks then regroup.   Try losartan 25 mg daily and regroup in 2 weeks to recheck.          Each maintenance medication was reviewed in detail including emphasizing most importantly the difference between maintenance and prns and under what circumstances the prns are to be triggered using an action plan format where appropriate.  Total time for H and P, chart review, counseling, teaching device and generating customized AVS unique to this acute office visit / charting = 30 min

## 2019-11-17 NOTE — Assessment & Plan Note (Signed)
HCO3 31 01/17/16 - 01/05/2015  Walked RA x 3 laps @ 185 ft each stopped due to  Leg pain and fatibue, no sob , nl pace, no desat  -  12/11/2016 Patient Saturations on Room Air at Rest = 88%----increased 98% 2lpm continuous - HC03  24    03/02/18  - 03/22/2018  Walked RA x 3 laps @ 185 ft each stopped due to  End of study, moderate pace, no   desat  / min sob - 05/17/2018  Walked 2lpm x 3 laps @ 185 ft each stopped due to  End of study, no desat   - HCO3  03/02/19  = 21  - HC03  09/22/19  = 23   rec 2lpm 24/7 for now

## 2019-11-17 NOTE — Progress Notes (Signed)
Subjective:    Patient ID: Chris [name], male    DOB: [birth year]   MRN: [id number]    Brief patient profile:  68 yobm active smoker with HIV  and doe x 2009 much worse since March 2014 so referred by [name] to pulmonary clinic 02/15/2013 with GOLD III copd by pfts 04/28/13.     History of Present Illness  02/15/2013 1st pulmonary eval  Cc indolent onset progressive doe x walking slow pace more than 100 ft,  not at rest., 02 dep, doe much worse x 3 months assoc with congested cough esp in am with sev tbsp thick white mucus takes about 30 min to clear.  No better on inhalers tried to date including advair and spiriva - albuterol works the best and using lots of saba and nebs rec Stop advair and spiriva Plan A = Automatic = Start symbicort 160 Take 2 puffs first thing in am and then another 2 puffs about 12 hours later.  Plan B = backup= Only use your albuterol (as a rescue medication to be used if you can't catch your breath by resting or doing a relaxed purse lip breathing pattern. The less you use it, the better it will work when you need it. Ok to use up to 2 puffs every 4 hours Plan C = nebulizer, use this only if plan B doesn't work ok to use up to 4 hours       05/26/2017  Acute  ov/Chris [name] re:   COPD III/ symb/spiriva rare saba / 02 2pm hs then during day prn  Chief Complaint  Patient presents with  . Acute Visit    Increased DOE x 3 days.   most days gets up 6 am with sob/some congestion > symb x 2 and then spiriva and saba and maybe once a week with saba hfa / did not bring it with him  Then worse gradually x 3 days, waking up 4am not using rescue then  Not much cough-   does not bother sleep until 4 am but even then minimal vol of mucus/ white  Needing 02 2lpm 24/7 now  On PPI bid ac  rec Plan A = Automatic =  symbicort 160 Take 2 puffs first thing in am and then another 2 puffs about 12 hours later.                                       spiriva 2 puffs  Work on inhaler  technique:   Plan B = Backup Only use your albuterol as a rescue medication Plan C = Crisis - only use your albuterol nebulizer if you first try Plan B The key is to stop smoking completely before smoking completely stops you!  Prednisone 10 mg take  4 each am x 2 days,   2 each am x 2 days,  1 each am x 2 days and stop  Please schedule a follow up visit in 3 months but call sooner if needed    Admit date: 07/21/2017 Discharge date: 07/28/2017  Time spent: 35 minutes  Recommendations for Outpatient Follow-up:  1. Repeat basic metabolic panel to follow electrolytes and renal function 2. Patient needs outpatient sleep study to sit up and determine candidacy for CPAP. 3. Reassess volume status and blood pressure, adjust medications as needed. 4. Close follow-up to patient's CBG and A1c; further adjust hypoglycemic regimen as indicated.  Discharge Diagnoses:  Active Problems:   HIV disease ([place])   COPD  GOLD III, still smoking   Steroid-induced hyperglycemia   Chronic respiratory failure with hypoxia and hypercapnia (HCC)   DM (diabetes mellitus) (HCC)   COPD exacerbation (HCC)   Obstructive sleep apnea syndrome   Essential hypertension, benign   Atopic eczema   Type 2 diabetes mellitus with complication (HCC)   Morbid (severe) obesity due to excess calories ([place])   COPD with acute exacerbation (HCC)   Depression   GERD (gastroesophageal reflux disease)   Acute on chronic respiratory failure with hypoxia ([place])    08/05/2017  Post hosp  f/u ov/Chris [name] re:  p copd exac - did not follow action plan reviewed at last ov prior to ER Chief Complaint  Patient presents with  . Follow-up    hospital f/u- [facility], D/C last week with COPD, using 3L [place], still has some SOB  with 02    -  MMRC2 = can't walk a nl pace on a flat grade s sob but does fine slow and flat  Using 02 as needed daytime and 2lpm hs  rec Work on inhaler technique:   Plan A = Automatic =  symbicort 160 Take 2 puffs  first thing in am and then another 2 puffs about 12 hours later. spiriva 2 puffs in am only  Plan B = Backup Only use your albuterol as a rescue medication  Plan C = Crisis - only use your albuterol nebulizer if you first try Plan B and it fails to help > ok to use the nebulizer up to every 4 hours but if start needing it regularly call for immediate appointment The key is to stop smoking completely before smoking completely stops you!  Keep your follow up appt but bring all inhalers and nebulizer solutions with you to this visit    09/22/2017  f/u ov/Chris [name] re: still smoking/ did not bring meds as rec/ saba hfa maybe twice  A week  On maint symb 160/spiriva  Chief Complaint  Patient presents with  . Follow-up    Pt c/o chest tightness on the left off and on for the past 3 wks.   Dyspnea:  Better than usual able to do [place] slow pace = MMRC2 = can't walk a nl pace on a flat grade s sob but does fine slow and flat  Cough: minimal/ mostly am mucoid Sleep: on 2lpm on side and 2 pillows ok  rec The key is to stop smoking completely before smoking completely stops you!  Ok to try symb 80 Take 2 puffs first thing in am and then another 2 puffs about 12 hours later to see if breathing is as good For thrush> clotrimazole troche 10 mg up to 4 x daily as needed       04/29/2018  f/u ov/Chris [name] re: GOLD III/ 02 dep hs and ex/ still smoking / still on augmentin /clind for ";pna" (no xrays avail) Chief Complaint  Patient presents with  . Follow-up    Had PNA recently.  He states he did not have his albuterol so he has been using his symbicort up to 4 x daily with no relief. He is not coughing much, but feels SOB.    Dyspnea: X 50 ft  Cough: resolved Sleeping: flat/ one pillow SABA use: ran out 02: 2lpmhs/ 3lpm activity  rec No change rx     05/17/2018  f/u ov/Chris [name] re:  GOLD III/ 02 dep / still smoking  Chief Complaint  Patient presents with  . Follow-up    Breathing is overall doing well. He  is using his albuterol inhaler once daily on average.   Dyspnea:  MMRC3 = can't walk 100 yards even at a slow pace at a flat grade s stopping due to sob even on 3lpm  Cough: min productions Sleeping: flat / on side with one pillow and  SABA maybe once a day when over does it  02: use:2lpm 24/7 except sometimes turns up with walking to 3lpm   rec No change medications for now - at next refills stop the symbicort and incruse and change to BEVESPI Take 2 puffs first thing in am and then another 2 puffs about 12 hours later to see if helps breathing and voice      10/04/2018  f/u ov/ re: copd III/ on 02 prn and bevespi 2 bid  Chief Complaint  Patient presents with  . Follow-up    Pt c/o increased cough and fatigue x 3 wks. His cough is prod with yellow sputum. He is using his albuterol inhaler about once per wk.  Dyspnea:  Can do save a lot/ does not use hc parking  Cough: worse x 3 weeks esp in am with slt yellow but min mucus production  Sleeping: on side / bed is flat two pillows  SABA use: rarely 02: 2lpm prn     06/10/2019  f/u ov/ re:  GOLD III oximetry/ 2lpm prn, bevespi 2 bid  Chief Complaint  Patient presents with  . Follow-up    Breathing is doing okay today. No new co's. He is using his albuterol inhaler about 2 x per wk.    Dyspnea:  Across parking lots/ walking at park = MMRC2 = can't walk a nl pace on a flat grade s sob but does fine slow and flat eg Cough: min / mucoid day > noct  Sleeping: flat bed on side / 1 pillow folded  SABA use: as above  02: prn rec No change rx    11/17/2019 acute extended  ov/ re: ? aecopd vs acei effects  Chief Complaint  Patient presents with  . Follow-up    Has been using his Bevespi 4 times a day despite being prescribed for twice a day. Increased SOB for the past month, with exertion. Still on 2L.   Dyspnea:  Ok sitting still but greadually worse doe = MMRC3 = can't walk 100 yards even at a slow pace at a flat grade s  stopping due to sob   Cough: dry cough on acei feels like chaulk in his throat  Sleeping: flat bed on side 2 pillows - can't lie on back s choking SABA use: way too much 02: 2lpm 24/7    No obvious day to day or daytime variability or assoc excess/ purulent sputum or mucus plugs or hemoptysis or cp or chest tightness, subjective wheeze or overt sinus or hb symptoms.    Also denies any obvious fluctuation of symptoms with weather or environmental changes or other aggravating or alleviating factors except as outlined above   No unusual exposure hx or h/o childhood pna/ asthma or knowledge of premature birth.  Current Allergies, Complete Past Medical History, Past Surgical History, Family History, and Social History were reviewed in [facility] electronic medical record.  ROS  The following are not active complaints unless bolded Hoarseness, sore throat, dysphagia, dental problems, itching, sneezing,  nasal congestion or discharge of excess mucus or purulent secretions,   ear ache,   fever, chills, sweats, unintended wt loss or wt gain, classically pleuritic or exertional cp,  orthopnea pnd or arm/hand swelling  or leg swelling, presyncope, palpitations, abdominal pain, anorexia, nausea, vomiting, diarrhea  or change in bowel habits or change in bladder habits, change in stools or change in urine, dysuria, hematuria,  rash, arthralgias, visual complaints, headache, numbness, weakness or ataxia or problems with walking or coordination,  change in mood or  memory.        Current Meds  Medication Sig  . Acetaminophen-Codeine 300-30 MG tablet Take 1-2 tablets by mouth every 8 (eight) hours as needed for pain.  . albuterol (PROVENTIL HFA;VENTOLIN HFA) 108 (90 Base) MCG/ACT inhaler Inhale 2 puffs into the lungs every 6 (six) hours as needed for wheezing or shortness of breath.  . ammonium lactate (AMLACTIN) 12 % lotion Apply 1 application topically 2 (two) times daily.  . apixaban (ELIQUIS) 5 MG  TABS tablet Take 1 tablet (5 mg total) by mouth 2 (two) times daily.  . atorvastatin (LIPITOR) 40 MG tablet Take 1 tablet (40 mg total) by mouth daily.  . baclofen (LIORESAL) 10 MG tablet Take 10 mg by mouth daily.   . BEVESPI AEROSPHERE 9-4.8 MCG/ACT AERO TAKE 2 PUFFS BY MOUTH TWICE A DAY  . bictegravir-emtricitabine-tenofovir AF (BIKTARVY) 50-200-25 MG TABS tablet Take 1 tablet by mouth daily.  . Blood Glucose Monitoring Suppl (ONETOUCH VERIO) w/Device KIT 1 kit by Does not apply route 2 (two) times daily.  . DULoxetine (CYMBALTA) 30 MG capsule TAKE 1 CAPSULE BY MOUTH EVERY DAY  . fluconazole (DIFLUCAN) 100 MG tablet Take 2 tablets by mouth today then 1 tablet daily for 6 days  . glucose blood (ONETOUCH VERIO) test strip Use as instructed. Check blood glucose level by fingerstick twice per day. E11.65  . hydrALAZINE (APRESOLINE) 50 MG tablet Take 1.5 tablets (75 mg total) by mouth every 8 (eight) hours.  . lisinopril (ZESTRIL) 2.5 MG tablet Take 1 tablet (2.5 mg total) by mouth daily.  . metFORMIN (GLUCOPHAGE) 1000 MG tablet Take 1 tablet (1,000 mg total) by mouth daily with breakfast.  . omeprazole (PRILOSEC) 40 MG capsule Take 1 capsule (40 mg total) by mouth in the morning.  . OneTouch Delica Lancets 33G MISC Use as instructed. Check blood glucose level by fingerstick twice per day. E11.65  . OXYGEN Inhale 2 L into the lungs as needed (shortness of breath). Uses every 2 days during day and qhs uses oxygen every night  . sildenafil (VIAGRA) 50 MG tablet Take 1 tablet (50 mg total) by mouth daily as needed for erectile dysfunction.  . Vitamin D, Ergocalciferol, (DRISDOL) 1.25 MG (50000 UNIT) CAPS capsule Take 1 capsule (50,000 Units total) by mouth every 7 (seven) days.                   Objective:   Physical Exam   Obese bm    Vital signs reviewed  11/17/2019  - Note at rest 02 sats  97% on 2lpm     11/17/2019        257  06/10/2019    259   10/04/2018     247  01/05/2015       278  >  02/29/2016  274 > 06/17/2016 277 > 09/25/2016   258  > 12/11/2016   270  > 05/26/2017  270 > 08/05/2017  255  > 09/22/2017   262 > 03/22/2018   244 > 04/29/2018  258 >   05/17/2018  253  01/28/2016        272   05/16/14 285 lb 6.4 oz (129.457 kg)  04/20/14 282 lb 12.8 oz (128.277 kg)  03/14/14 274 lb 3.2 oz (124.376 kg)      HEENT : pt wearing mask not removed for exam due to covid - 19 concerns.    NECK :  without JVD/Nodes/TM/ nl carotid upstrokes bilaterally   LUNGS: no acc muscle use,  Mild barrel  contour chest wall with bilateral  Distant bs s audible wheeze and  without cough on insp or exp maneuvers  and mild  Hyperresonant  to  percussion bilaterally     CV:  RRR  no s3 or murmur or increase in P2, and no edema   ABD:  soft and nontender with pos end  insp Hoover's  in the supine position. No bruits or organomegaly appreciated, bowel sounds nl  MS:   Nl gait/  ext warm without deformities, calf tenderness, cyanosis or clubbing No obvious joint restrictions   SKIN: warm and dry without lesions    NEURO:  alert, approp, nl sensorium with  no motor or cerebellar deficits apparent.            CXR PA and Lateral:   11/17/2019 :    I personally reviewed images and agree with radiology impression as follows:    Emphysematous changes with chronic accentuation of RIGHT middle lobe markings. Enlargement of cardiac silhouette with pulmonary vascular congestion. No definite acute infiltrate.         Assessment & Plan:   

## 2019-11-17 NOTE — Patient Instructions (Addendum)
Stop bevespi and start Breztri Take 2 puffs first thing in am and then another 2 puffs about 12 hours later.   Only use your albuterol (ventolin) as a rescue medication to be used if you can't catch your breath by resting or doing a relaxed purse lip breathing pattern.  - The less you use it, the better it will work when you need it. - Ok to use up to 2 puffs  every 4 hours if you must but call for immediate appointment if use goes up over your usual need - Don't leave home without it !!  (think of it like the spare tire for your car)   Prednisone 10 mg take  4 each am x 2 days,   2 each am x 2 days,  1 each am x 2 days and stop   Stop lisinopril and start losartan 25 mg daily   Please remember to go to the  x-ray department  for your tests - we will call you with the results when they are available    Please schedule a follow up office visit in 2 weeks, call sooner if needed with all medications /inhalers/ solutions in hand so we can verify exactly what you are taking. This includes all medications from all doctors and over the counters - PLEASE separate them into two bags:  the ones you take automatically, no matter what, vs the ones you take just when you feel you need them "BAG #2 is UP TO YOU"  - this will really help [place] help you take your medications more effectively.

## 2019-11-17 NOTE — Progress Notes (Signed)
Seen by Dr. Sherene Sires in OV today.   Chris Headland FNP

## 2019-11-17 NOTE — Assessment & Plan Note (Signed)
Active smoker - 04/28/2013 PFT's FEV1 1.21 (39%) ratio 59 and 13% better p B2 and dlco 72 corrects to 102  - started spiriva 04/30/2013 > changed to respimat 02/28/2014 -Med calendar 03/14/2014 > not using as of 05/16/14  - arrived on stiolto / symbicort 02/29/2016 > changed to symbicort 80/spiriva (lower dose ICS due to interaction with aids meds  - PFT's  06/17/2016  FEV1 1.29 (42 % ) ratio 64  p 43 % improvement from saba p symb 80 /spiriva prior to study with DLCO  51/49c % corrects to 86  % for alv volume   - 06/17/2016   change symb to 160 2bid  - 09/25/2016  After extensive coaching device  effectiveness =    90% with dpi/ elipta > changed to Incruse per Insurance restrictions > preferred spiriva - 12/11/2016   changed to spiriva respimat  08/05/2017  After extensive coaching HFA effectiveness =  90%    - 09/22/2017 changed to symb 80 2bid with pseudowheeze and thrush  - PFT's  03/22/2018  FEV1 1.34 (45 % ) ratio 63  p 17 % improvement from saba p sym 160/ incruse prior to study with DLCO  36/41 % corrects to 72  % for alv volume   - trial of bevespi 2bid 05/17/2018 >>> improved as of 10/04/2018  - 11/17/2019  After extensive coaching inhaler device,  effectiveness =    90%   DDX of  difficult airways management almost all start with A and  include Adherence, Ace Inhibitors, Acid Reflux, Active Sinus Disease, Alpha 1 Antitripsin deficiency, Anxiety masquerading as Airways dz,  ABPA,  Allergy(esp in young), Aspiration (esp in elderly), Adverse effects of meds,  Active smoking or vaping, A bunch of PE's (a small clot burden can't cause this syndrome unless there is already severe underlying pulm or vascular dz with poor reserve) plus two Bs  = Bronchiectasis and Beta blocker use..and one C= CHF  Adherence is always the initial "prime suspect" and is a multilayered concern that requires a "trust but verify" approach in every patient - starting with knowing how to use medications, especially inhalers, correctly,  keeping up with refills and understanding the fundamental difference between maintenance and prns vs those medications only taken for a very short course and then stopped and not refilled.  - see device teaching - return with all meds in hand using a trust but verify approach to confirm accurate Medication  Reconciliation The principal here is that until we are certain that the  patients are doing what we've asked, it makes no sense to ask them to do more.   ACEi adverse effects at the  top of the usual list of suspects and the only way to rule it out is a trial off > see a/p    Active smoker > see sep a/p  ? Allergy/asthma > Prednisone 10 mg take  4 each am x 2 days,   2 each am x 2 days,  1 each am x 2 days and stop / change to breztri x 2 weeks samples then ? Resume bevespi since has HIV.  ? Acid (or non-acid) GERD > always difficult to exclude as up to 75% of pts in some series report no assoc GI/ Heartburn symptoms> rec max (24h)  acid suppression and diet restrictions/ reviewed and instructions given in writing.   ? chf > nothing to suggest clinically at this point

## 2019-11-18 ENCOUNTER — Other Ambulatory Visit: Payer: Self-pay | Admitting: Nurse Practitioner

## 2019-11-29 ENCOUNTER — Ambulatory Visit: Payer: Medicare HMO | Admitting: Cardiology

## 2019-12-01 ENCOUNTER — Other Ambulatory Visit: Payer: Self-pay | Admitting: Nurse Practitioner

## 2019-12-06 ENCOUNTER — Ambulatory Visit (INDEPENDENT_AMBULATORY_CARE_PROVIDER_SITE_OTHER): Payer: Medicare HMO | Admitting: Internal Medicine

## 2019-12-06 ENCOUNTER — Other Ambulatory Visit: Payer: Self-pay

## 2019-12-06 ENCOUNTER — Encounter: Payer: Self-pay | Admitting: Internal Medicine

## 2019-12-06 DIAGNOSIS — J449 Chronic obstructive pulmonary disease, unspecified: Secondary | ICD-10-CM | POA: Diagnosis not present

## 2019-12-06 DIAGNOSIS — J9612 Chronic respiratory failure with hypercapnia: Secondary | ICD-10-CM

## 2019-12-06 DIAGNOSIS — J9611 Chronic respiratory failure with hypoxia: Secondary | ICD-10-CM

## 2019-12-06 DIAGNOSIS — R69 Illness, unspecified: Secondary | ICD-10-CM | POA: Diagnosis not present

## 2019-12-06 DIAGNOSIS — F17210 Nicotine dependence, cigarettes, uncomplicated: Secondary | ICD-10-CM | POA: Diagnosis not present

## 2019-12-06 DIAGNOSIS — I10 Essential (primary) hypertension: Secondary | ICD-10-CM

## 2019-12-06 MED ORDER — CLOTRIMAZOLE 10 MG MT TROC: 10.0000 mg | Freq: Every day | OROMUCOSAL | 0 refills | Status: DC

## 2019-12-06 NOTE — Assessment & Plan Note (Addendum)
Active smoker - 04/28/2013 PFT's FEV1 1.21 (39%) ratio 59 and 13% better p B2 and dlco 72 corrects to 102  - started spiriva 04/30/2013 > changed to respimat 02/28/2014 -Med calendar 03/14/2014 > not using as of 05/16/14  - arrived on stiolto / symbicort 02/29/2016 > changed to symbicort 80/spiriva (lower dose ICS due to interaction with aids meds  - PFT's  06/17/2016  FEV1 1.29 (42 % ) ratio 64  p 43 % improvement from saba p symb 80 /spiriva prior to study with DLCO  51/49c % corrects to 86  % for alv volume   - 06/17/2016   change symb to 160 2bid  - 09/25/2016  After extensive coaching device  effectiveness =    90% with dpi/ elipta > changed to Incruse per Insurance restrictions > preferred spiriva - 12/11/2016   changed to spiriva respimat  08/05/2017  After extensive coaching HFA effectiveness =  90%    - 09/22/2017 changed to symb 80 2bid with pseudowheeze and thrush  - PFT's  03/22/2018  FEV1 1.34 (45 % ) ratio 63  p 17 % improvement from saba p sym 160/ incruse prior to study with DLCO  36/41 % corrects to 72  % for alv volume   - trial of bevespi 2bid 05/17/2018 >>> improved as of 10/04/2018  - 11/17/2019  After extensive coaching inhaler device,  effectiveness =    90% >  Try breztri> bad thrush so changed back to bevespi   No asthmatic component, hiv with thrush tendency to avoid ICS for now and continue to work on no smoking/ maint with bevespi with prn saba and leave off ICS altogether if possible   Pt informed of the seriousness of COVID 19 infection as a direct risk to lung health  and safey and to close contacts and should continue to wear a facemask in public and minimize exposure to public locations but especially avoid any area or activity where non-close contacts are not observing distancing or wearing an appropriate face mask.  I strongly recommended vaccine when offered.

## 2019-12-06 NOTE — Patient Instructions (Addendum)
Wear your 02 2lpm 24/7   Clotrimazole troche 5 x daily until gone   Stop losartan  Continue bevespi  Take 2 puffs first thing in am and then another 2 puffs about 12 hours later.    Only use your albuterol as a rescue medication to be used if you can't catch your breath by resting or doing a relaxed purse lip breathing pattern.  - The less you use it, the better it will work when you need it. - Ok to use up to 2 puffs  every 4 hours if you must but call for immediate appointment if use goes up over your usual need - Don't leave home without it !!  (think of it like the spare tire for your car)    We are referring you to Dr Wynona Neat for sleep evaluation and ok to follow up with him or me after he completes your evaluation

## 2019-12-06 NOTE — Assessment & Plan Note (Signed)
Counseled re importance of smoking cessation but did not meet time criteria for separate billing   °

## 2019-12-06 NOTE — Progress Notes (Signed)
Subjective:    Patient ID: Chris [name], male    DOB: 08/29/1965   MRN: [id number]    Brief patient profile:  44 yobm active smoker with HIV  and doe x 2009 much worse since March 2014 so referred by [name] to pulmonary clinic 02/15/2013 with GOLD III copd by pfts 04/28/13.     History of Present Illness  02/15/2013 1st pulmonary eval  Cc indolent onset progressive doe x walking slow pace more than 100 ft,  not at rest., 02 dep, doe much worse x 3 months assoc with congested cough esp in am with sev tbsp thick white mucus takes about 30 min to clear.  No better on inhalers tried to date including advair and spiriva - albuterol works the best and using lots of saba and nebs rec Stop advair and spiriva Plan A = Automatic = Start symbicort 160 Take 2 puffs first thing in am and then another 2 puffs about 12 hours later.  Plan B = backup= Only use your albuterol (as a rescue medication to be used if you can't catch your breath by resting or doing a relaxed purse lip breathing pattern. The less you use it, the better it will work when you need it. Ok to use up to 2 puffs every 4 hours Plan C = nebulizer, use this only if plan B doesn't work ok to use up to 4 hours       05/26/2017  Acute  ov/Fronnie Urton re:   COPD III/ symb/spiriva rare saba / 02 2pm hs then during day prn  Chief Complaint  Patient presents with  . Acute Visit    Increased DOE x 3 days.   most days gets up 6 am with sob/some congestion > symb x 2 and then spiriva and saba and maybe once a week with saba hfa / did not bring it with him  Then worse gradually x 3 days, waking up 4am not using rescue then  Not much cough-   does not bother sleep until 4 am but even then minimal vol of mucus/ white  Needing 02 2lpm 24/7 now  On PPI bid ac  rec Plan A = Automatic =  symbicort 160 Take 2 puffs first thing in am and then another 2 puffs about 12 hours later.                                       spiriva 2 puffs  Work on inhaler  technique:   Plan B = Backup Only use your albuterol as a rescue medication Plan C = Crisis - only use your albuterol nebulizer if you first try Plan B The key is to stop smoking completely before smoking completely stops you!  Prednisone 10 mg take  4 each am x 2 days,   2 each am x 2 days,  1 each am x 2 days and stop  Please schedule a follow up visit in 3 months but call sooner if needed    Admit date: 07/21/2017 Discharge date: 07/28/2017  Time spent: 35 minutes  Recommendations for Outpatient Follow-up:  1. Repeat basic metabolic panel to follow electrolytes and renal function 2. Patient needs outpatient sleep study to sit up and determine candidacy for CPAP. 3. Reassess volume status and blood pressure, adjust medications as needed. 4. Close follow-up to patient's CBG and A1c; further adjust hypoglycemic regimen as indicated.  Discharge Diagnoses:  Active Problems:   HIV disease ([place])   COPD  GOLD III, still smoking   Steroid-induced hyperglycemia   Chronic respiratory failure with hypoxia and hypercapnia (HCC)   DM (diabetes mellitus) (HCC)   COPD exacerbation (HCC)   Obstructive sleep apnea syndrome   Essential hypertension, benign   Atopic eczema   Type 2 diabetes mellitus with complication (HCC)   Morbid (severe) obesity due to excess calories ([place])   COPD with acute exacerbation (HCC)   Depression   GERD (gastroesophageal reflux disease)   Acute on chronic respiratory failure with hypoxia ([place])    08/05/2017  Post hosp  f/u ov/Ryliee Figge re:  p copd exac - did not follow action plan reviewed at last ov prior to ER Chief Complaint  Patient presents with  . Follow-up    hospital f/u- [facility], D/C last week with COPD, using 3L [place], still has some SOB  with 02    -  MMRC2 = can't walk a nl pace on a flat grade s sob but does fine slow and flat  Using 02 as needed daytime and 2lpm hs  rec Work on inhaler technique:   Plan A = Automatic =  symbicort 160 Take 2 puffs  first thing in am and then another 2 puffs about 12 hours later. spiriva 2 puffs in am only  Plan B = Backup Only use your albuterol as a rescue medication  Plan C = Crisis - only use your albuterol nebulizer if you first try Plan B and it fails to help > ok to use the nebulizer up to every 4 hours but if start needing it regularly call for immediate appointment The key is to stop smoking completely before smoking completely stops you!  Keep your follow up appt but bring all inhalers and nebulizer solutions with you to this visit    09/22/2017  f/u ov/Yazmen Briones re: still smoking/ did not bring meds as rec/ saba hfa maybe twice  A week  On maint symb 160/spiriva  Chief Complaint  Patient presents with  . Follow-up    Pt c/o chest tightness on the left off and on for the past 3 wks.   Dyspnea:  Better than usual able to do [employer] slow pace = MMRC2 = can't walk a nl pace on a flat grade s sob but does fine slow and flat  Cough: minimal/ mostly am mucoid Sleep: on 2lpm on side and 2 pillows ok  rec The key is to stop smoking completely before smoking completely stops you!  Ok to try symb 80 Take 2 puffs first thing in am and then another 2 puffs about 12 hours later to see if breathing is as good For thrush> clotrimazole troche 10 mg up to 4 x daily as needed     05/17/2018  f/u ov/Clebert Wenger re:  GOLD III/ 02 dep / still smoking Chief Complaint  Patient presents with  . Follow-up    Breathing is overall doing well. He is using his albuterol inhaler once daily on average.   Dyspnea:  MMRC3 = can't walk 100 yards even at a slow pace at a flat grade s stopping due to sob even on 3lpm  Cough: min productions Sleeping: flat / on side with one pillow and  SABA maybe once a day when over does it  02: use:2lpm 24/7 except sometimes turns up with walking to 3lpm   rec No change medications for now - at next refills stop the symbicort  and incruse and change to BEVESPI Take 2 puffs first thing in am and  then another 2 puffs about 12 hours later to see if helps breathing and voice     11/17/2019 acute extended  ov/Adolphus Hanf re: ? aecopd vs acei effects   GOLD III/ 02 prn maint on bevespi 2 bid  Chief Complaint  Patient presents with  . Follow-up    Has been using his Bevespi 4 times a day despite being prescribed for twice a day. Increased SOB for the past month, with exertion. Still on 2L.   Dyspnea:  Ok sitting still but gradually worse doe = MMRC3 = can't walk 100 yards even at a slow pace at a flat grade s stopping due to sob   Cough: dry cough on acei feels like chaulk in his throat  Sleeping: flat bed on side 2 pillows - can't lie on back s choking SABA use: way too much 02: 2lpm 24/7  rec Stop bevespi and start Breztri Take 2 puffs first thing in am and then another 2 puffs about 12 hours later.  Only use your albuterol (ventolin) as a rescue medication Prednisone 10 mg take  4 each am x 2 days,   2 each am x 2 days,  1 each am x 2 days and stop  Stop lisinopril and start losartan 25 mg daily    Please schedule a follow up office visit in 2 weeks, call sooner if needed with all medications /inhalers/ solutions    12/06/2019  f/u ov/Derric Dealmeida re: GOLD III/ still smoking/ not using 02 approp on besvespi 2bid Chief Complaint  Patient presents with  . Follow-up    Breathing had improved some- he liked taking the breztri but states it made his throat sore. He is using his albuterol inhaler 2 x per day on average.   Dyspnea:  One aisle at [employer] leaning on cart s 02  = MMRC3 = can't walk 100 yards even at a slow pace at a flat grade s stopping due to sob   Cough: no Sleeping: on side/ can't lie on back s choking > better on 2 pillows> canceled split night study  SABA use: as above  02: 2lpm prn based on sob / not checking sats    No obvious day to day or daytime variability or assoc excess/ purulent sputum or mucus plugs or hemoptysis or cp or chest tightness, subjective wheeze or overt sinus  or hb symptoms.     Also denies any obvious fluctuation of symptoms with weather or environmental changes or other aggravating or alleviating factors except as outlined above   No unusual exposure hx or h/o childhood pna/ asthma or knowledge of premature birth.  Current Allergies, Complete Past Medical History, Past Surgical History, Family History, and Social History were reviewed in [employer] record.  ROS  The following are not active complaints unless bolded Hoarseness, sore throat, dysphagia, dental problems, itching, sneezing,  nasal congestion or discharge of excess mucus or purulent secretions, ear ache,   fever, chills, sweats, unintended wt loss or wt gain, classically pleuritic or exertional cp,  orthopnea pnd or arm/hand swelling  or leg swelling, presyncope, palpitations, abdominal pain, anorexia, nausea, vomiting, diarrhea  or change in bowel habits or change in bladder habits, change in stools or change in urine, dysuria, hematuria,  rash, arthralgias, visual complaints, headache, numbness, weakness or ataxia or problems with walking or coordination,  change in mood or  memory.  Current Meds  Medication Sig  . Acetaminophen-Codeine 300-30 MG tablet Take 1-2 tablets by mouth every 8 (eight) hours as needed for pain.  [name] albuterol (PROVENTIL HFA;VENTOLIN HFA) 108 (90 Base) MCG/ACT inhaler Inhale 2 puffs into the lungs every 6 (six) hours as needed for wheezing or shortness of breath.  [name] ammonium lactate (AMLACTIN) 12 % lotion Apply 1 application topically 2 (two) times daily.  [name] apixaban (ELIQUIS) 5 MG TABS tablet Take 1 tablet (5 mg total) by mouth 2 (two) times daily.  . baclofen (LIORESAL) 10 MG tablet Take 10 mg by mouth daily.   . bictegravir-emtricitabine-tenofovir AF (BIKTARVY) 50-200-25 MG TABS tablet Take 1 tablet by mouth daily.  . Blood Glucose Monitoring Suppl (ONETOUCH VERIO) w/Device KIT 1 kit by Does not apply route 2 (two) times daily.  .  Budeson-Glycopyrrol-Formoterol (BREZTRI AEROSPHERE) 160-9-4.8 MCG/ACT AERO I no longer takig   . DULoxetine (CYMBALTA) 30 MG capsule TAKE 1 CAPSULE BY MOUTH EVERY DAY  . glucose blood (ONETOUCH VERIO) test strip Use as instructed. Check blood glucose level by fingerstick twice per day. E11.65  . losartan (COZAAR) 25 MG tablet Take 1 tablet (25 mg total) by mouth daily.  . metFORMIN (GLUCOPHAGE) 1000 MG tablet Take 1 tablet (1,000 mg total) by mouth daily with breakfast.  . OneTouch Delica Lancets 24P MISC Use as instructed. Check blood glucose level by fingerstick twice per day. E11.65  . OXYGEN Inhale 2 L into the lungs as needed (shortness of breath). Uses every 2 days during day and qhs uses oxygen every night  . predniSONE (DELTASONE) 10 MG tablet Take  4 each am x 2 days,   2 each am x 2 days,  1 each am x 2 days and stop  . sildenafil (VIAGRA) 50 MG tablet Take 1 tablet (50 mg total) by mouth daily as needed for erectile dysfunction.  . Vitamin D, Ergocalciferol, (DRISDOL) 1.25 MG (50000 UNIT) CAPS capsule Take 1 capsule (50,000 Units total) by mouth every 7 (seven) days.                          Objective:   Physical Exam  amb obese bm nad    12/06/2019      246  11/17/2019        257  06/10/2019    259   10/04/2018     247  01/05/2015       278 >  02/29/2016  274 > 06/17/2016 277 > 09/25/2016   258  > 12/11/2016   270  > 05/26/2017  270 > 08/05/2017  255  > 09/22/2017   262 > 03/22/2018   244 > 04/29/2018  258 > 05/17/2018  253  01/28/2016        272   05/16/14 285 lb 6.4 oz (129.457 kg)  04/20/14 282 lb 12.8 oz (128.277 kg)  03/14/14 274 lb 3.2 oz (124.376 kg)      Vital signs reviewed  12/06/2019  - Note at rest 02 sats  93% on 2lpm and BP  96/58     HEENT :  Pos thrush    NECK :  without JVD/Nodes/TM/ nl carotid upstrokes bilaterally   LUNGS: no acc muscle use,  Mild barrel  contour chest wall with bilateral  Distant bs s audible wheeze and  without cough on insp or exp  maneuvers  and mild  Hyperresonant  to  percussion bilaterally     CV:  RRR  no s3 or murmur or increase in P2, and no edema   ABD: quite obese  soft and nontender with pos end  insp Hoover's  in the supine position. No bruits or organomegaly appreciated, bowel sounds nl  MS:   Nl gait/  ext warm without deformities, calf tenderness, cyanosis or clubbing No obvious joint restrictions   SKIN: warm and dry without lesions    NEURO:  alert, approp, nl sensorium with  no motor or cerebellar deficits apparent.           I personally reviewed images and agree with radiology impression as follows:  CXR:    PA and Lat 11/17/19  Emphysematous changes with chronic accentuation of RIGHT middle lobe markings.  Enlargement of cardiac silhouette with pulmonary vascular congestion.  No definite acute infiltrate        Assessment & Plan:

## 2019-12-06 NOTE — Assessment & Plan Note (Signed)
D/c acei 11/17/2019  Due to pseudowheeze - D/c losartan  12/06/2019 as bp too low on this and diltiazem

## 2019-12-06 NOTE — Assessment & Plan Note (Signed)
Probably also has OSA at this point   >>> refer to Dr Wynona Neat for sleep eval

## 2019-12-07 ENCOUNTER — Encounter: Payer: Self-pay | Admitting: Internal Medicine

## 2019-12-07 NOTE — Assessment & Plan Note (Signed)
HCO3 31 01/17/16 - 01/05/2015  Walked RA x 3 laps @ 185 ft each stopped due to  Leg pain and fatibue, no sob , nl pace, no desat  -  12/11/2016 Patient Saturations on Room Air at Rest = 88%----increased 98% 2lpm continuous - HC03  24    03/02/18  - 03/22/2018  Walked RA x 3 laps @ 185 ft each stopped due to  End of study, moderate pace, no   desat  / min sob - 05/17/2018  Walked 2lpm x 3 laps @ 185 ft each stopped due to  End of study, no desat   - HCO3  03/02/19  = 21  - HC03  09/22/19  = 23  - 12/06/2019   83% at rest RA pt started on 2lpm contuous o2 and sats increased to 91%-- pt walked 2 laps at average pace and sats at end 93% 2lpm   Hypercarbia resolved, does fine on 02 but needs to wear it 24/7 and obviously avoid all cigs as immediate fire danger in this setting          Each maintenance medication was reviewed in detail including emphasizing most importantly the difference between maintenance and prns and under what circumstances the prns are to be triggered using an action plan format where appropriate.  Total time for H and P, chart review, counseling,  directly observing portions of ambulatory 02 saturation study/  and generating customized AVS unique to this office visit / charting = 30 min

## 2019-12-08 ENCOUNTER — Other Ambulatory Visit: Payer: Self-pay | Admitting: Nurse Practitioner

## 2019-12-08 ENCOUNTER — Telehealth: Payer: Self-pay | Admitting: Nurse Practitioner

## 2019-12-08 DIAGNOSIS — G8929 Other chronic pain: Secondary | ICD-10-CM

## 2019-12-08 DIAGNOSIS — M545 Low back pain, unspecified: Secondary | ICD-10-CM

## 2019-12-08 MED ORDER — ACETAMINOPHEN-CODEINE 300-30 MG PO TABS: 1.0000 | ORAL_TABLET | Freq: Three times a day (TID) | ORAL | 0 refills | Status: DC | PRN

## 2019-12-08 NOTE — Telephone Encounter (Signed)
Patient called and requested for listed medication to be refilled and send to [id number] - [place] - [address] AT [facility] OF [address]  [address], [place] [address]  Acetaminophen-Codeine 300-30 MG tablet [[id number]]

## 2019-12-15 DIAGNOSIS — J449 Chronic obstructive pulmonary disease, unspecified: Secondary | ICD-10-CM | POA: Diagnosis not present

## 2019-12-15 DIAGNOSIS — J129 Viral pneumonia, unspecified: Secondary | ICD-10-CM | POA: Diagnosis not present

## 2019-12-19 ENCOUNTER — Other Ambulatory Visit: Payer: Self-pay | Admitting: Adult Health

## 2019-12-19 ENCOUNTER — Other Ambulatory Visit: Payer: Self-pay | Admitting: Nurse Practitioner

## 2019-12-19 DIAGNOSIS — F4321 Adjustment disorder with depressed mood: Secondary | ICD-10-CM
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
## 2019-12-20 ENCOUNTER — Telehealth (INDEPENDENT_AMBULATORY_CARE_PROVIDER_SITE_OTHER): Payer: Medicare HMO | Admitting: Adult Health

## 2019-12-20 ENCOUNTER — Encounter: Payer: Self-pay | Admitting: Adult Health

## 2019-12-20 VITALS — Ht 69.0 in | Wt 245.0 lb

## 2019-12-20 DIAGNOSIS — Z8679 Personal history of other diseases of the circulatory system: Secondary | ICD-10-CM | POA: Diagnosis not present

## 2019-12-20 DIAGNOSIS — E1169 Type 2 diabetes mellitus with other specified complication: Secondary | ICD-10-CM | POA: Diagnosis not present

## 2019-12-20 DIAGNOSIS — I10 Essential (primary) hypertension: Secondary | ICD-10-CM

## 2019-12-20 DIAGNOSIS — J449 Chronic obstructive pulmonary disease, unspecified: Secondary | ICD-10-CM | POA: Diagnosis not present

## 2019-12-20 MED ORDER — DILTIAZEM HCL ER COATED BEADS 240 MG PO CP24: 480.0000 mg | ORAL_CAPSULE | Freq: Every day | ORAL | 3 refills | Status: DC

## 2019-12-20 MED ORDER — APIXABAN 5 MG PO TABS: 5.0000 mg | ORAL_TABLET | Freq: Two times a day (BID) | ORAL | 3 refills | Status: DC

## 2019-12-20 NOTE — Patient Instructions (Signed)
Medication Instructions:  Continue current medications  *If you need a refill on your cardiac medications before your next appointment, please call your pharmacy*   Lab Work: None Ordered   Testing/Procedures: None Ordered   Follow-Up: At [facility], you and your health needs are our priority.  As part of our continuing mission to provide you with exceptional heart care, we have created designated Provider Care Teams.  These Care Teams include your primary Cardiologist (physician) and Advanced Practice Providers (APPs -  Physician Assistants and Nurse Practitioners) who all work together to provide you with the care you need, when you need it.  We recommend signing up for the patient portal called "MyChart".  Sign up information is provided on this After Visit Summary.  MyChart is used to connect with patients for Virtual Visits (Telemedicine).  Patients are able to view lab/test results, encounter notes, upcoming appointments, etc.  Non-urgent messages can be sent to your provider as well.   To learn more about what you can do with MyChart, go to [email or web address].    Your next appointment:   1 year(s)  The format for your next appointment:   In Person  Provider:   You may see David Harding, MD or one of the following Advanced Practice Providers on your designated Care Team:    Rhonda Barrett, PA-C  Kathryn Lawrence, DNP, ANP  Cadence Furth, NP     

## 2019-12-21 DIAGNOSIS — R69 Illness, unspecified: Secondary | ICD-10-CM | POA: Diagnosis not present

## 2019-12-23 ENCOUNTER — Ambulatory Visit (INDEPENDENT_AMBULATORY_CARE_PROVIDER_SITE_OTHER): Payer: Medicare HMO | Admitting: Pulmonary Disease

## 2019-12-23 ENCOUNTER — Other Ambulatory Visit: Payer: Self-pay

## 2019-12-23 ENCOUNTER — Encounter: Payer: Self-pay | Admitting: Pulmonary Disease

## 2019-12-23 VITALS — BP 100/70 | HR 101 | Temp 97.2°F | Ht 69.0 in | Wt 242.4 lb

## 2019-12-23 DIAGNOSIS — G4719 Other hypersomnia: Secondary | ICD-10-CM | POA: Diagnosis not present

## 2019-12-23 NOTE — Addendum Note (Signed)
Addended by: Delrae Rend on: 12/23/2019 12:03 PM   Modules accepted: Orders

## 2019-12-23 NOTE — Patient Instructions (Signed)
Obtain in lab polysomnogram for obstructive sleep apnea  Call with significant concerns Sleep Apnea Sleep apnea is a condition in which breathing pauses or becomes shallow during sleep. Episodes of sleep apnea usually last 10 seconds or longer, and they may occur as many as 20 times an hour. Sleep apnea disrupts your sleep and keeps your body from getting the rest that it needs. This condition can increase your risk of certain health problems, including:  Heart attack.  Stroke.  Obesity.  Diabetes.  Heart failure.  Irregular heartbeat. What are the causes? There are three kinds of sleep apnea:  Obstructive sleep apnea. This kind is caused by a blocked or collapsed airway.  Central sleep apnea. This kind happens when the part of the brain that controls breathing does not send the correct signals to the muscles that control breathing.  Mixed sleep apnea. This is a combination of obstructive and central sleep apnea. The most common cause of this condition is a collapsed or blocked airway. An airway can collapse or become blocked if:  Your throat muscles are abnormally relaxed.  Your tongue and tonsils are larger than normal.  You are overweight.  Your airway is smaller than normal. What increases the risk? You are more likely to develop this condition if you:  Are overweight.  Smoke.  Have a smaller than normal airway.  Are elderly.  Are male.  Drink alcohol.  Take sedatives or tranquilizers.  Have a family history of sleep apnea. What are the signs or symptoms? Symptoms of this condition include:  Trouble staying asleep.  Daytime sleepiness and tiredness.  Irritability.  Loud snoring.  Morning headaches.  Trouble concentrating.  Forgetfulness.  Decreased interest in sex.  Unexplained sleepiness.  Mood swings.  Personality changes.  Feelings of depression.  Waking up often during the night to urinate.  Dry mouth.  Sore throat. How is  this diagnosed? This condition may be diagnosed with:  A medical history.  A physical exam.  A series of tests that are done while you are sleeping (sleep study). These tests are usually done in a sleep lab, but they may also be done at home. How is this treated? Treatment for this condition aims to restore normal breathing and to ease symptoms during sleep. It may involve managing health issues that can affect breathing, such as high blood pressure or obesity. Treatment may include:  Sleeping on your side.  Using a decongestant if you have nasal congestion.  Avoiding the use of depressants, including alcohol, sedatives, and narcotics.  Losing weight if you are overweight.  Making changes to your diet.  Quitting smoking.  Using a device to open your airway while you sleep, such as: ? An oral appliance. This is a custom-made mouthpiece that shifts your lower jaw forward. ? A continuous positive airway pressure (CPAP) device. This device blows air through a mask when you breathe out (exhale). ? A nasal expiratory positive airway pressure (EPAP) device. This device has valves that you put into each nostril. ? A bi-level positive airway pressure (BPAP) device. This device blows air through a mask when you breathe in (inhale) and breathe out (exhale).  Having surgery if other treatments do not work. During surgery, excess tissue is removed to create a wider airway. It is important to get treatment for sleep apnea. Without treatment, this condition can lead to:  High blood pressure.  Coronary artery disease.  In men, an inability to achieve or maintain an erection (impotence).  Reduced  thinking abilities. Follow these instructions at home: Lifestyle  Make any lifestyle changes that your health care provider recommends.  Eat a healthy, well-balanced diet.  Take steps to lose weight if you are overweight.  Avoid using depressants, including alcohol, sedatives, and  narcotics.  Do not use any products that contain nicotine or tobacco, such as cigarettes, e-cigarettes, and chewing tobacco. If you need help quitting, ask your health care provider. General instructions  Take over-the-counter and prescription medicines only as told by your health care provider.  If you were given a device to open your airway while you sleep, use it only as told by your health care provider.  If you are having surgery, make sure to tell your health care provider you have sleep apnea. You may need to bring your device with you.  Keep all follow-up visits as told by your health care provider. This is important. Contact a health care provider if:  The device that you received to open your airway during sleep is uncomfortable or does not seem to be working.  Your symptoms do not improve.  Your symptoms get worse. Get help right away if:  You develop: ? Chest pain. ? Shortness of breath. ? Discomfort in your back, arms, or stomach.  You have: ? Trouble speaking. ? Weakness on one side of your body. ? Drooping in your face. These symptoms may represent a serious problem that is an emergency. Do not wait to see if the symptoms will go away. Get medical help right away. Call your local emergency services (911 in the [place] Do not drive yourself to the hospital. Summary  Sleep apnea is a condition in which breathing pauses or becomes shallow during sleep.  The most common cause is a collapsed or blocked airway.  The goal of treatment is to restore normal breathing and to ease symptoms during sleep. This information is not intended to replace advice given to you by your health care provider. Make sure you discuss any questions you have with your health care provider. Document Revised: 01/12/2019 Document Reviewed: 03/23/2018 Elsevier Patient Education  2020 [employer].
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
## 2020-01-03 ENCOUNTER — Telehealth: Payer: Self-pay | Admitting: Nurse Practitioner

## 2020-01-03 NOTE — Telephone Encounter (Signed)
Patient called and requested for listed medication to be refilled and sent to [facility] [address] Acetaminophen-Codeine 300-30 MG tablet [[id number]]

## 2020-01-06 ENCOUNTER — Other Ambulatory Visit: Payer: Self-pay | Admitting: Nurse Practitioner

## 2020-01-06 DIAGNOSIS — G8929 Other chronic pain: Secondary | ICD-10-CM

## 2020-01-06 MED ORDER — ACETAMINOPHEN-CODEINE 300-30 MG PO TABS: 1.0000 | ORAL_TABLET | Freq: Three times a day (TID) | ORAL | 0 refills | Status: DC | PRN

## 2020-01-06 NOTE — Telephone Encounter (Signed)
Will route to PCP for medication request.

## 2020-01-12 ENCOUNTER — Other Ambulatory Visit (HOSPITAL_COMMUNITY): Payer: Medicare HMO

## 2020-01-13 ENCOUNTER — Ambulatory Visit: Payer: Medicare HMO | Admitting: Podiatry

## 2020-01-14 ENCOUNTER — Encounter (HOSPITAL_BASED_OUTPATIENT_CLINIC_OR_DEPARTMENT_OTHER): Payer: Medicare HMO | Admitting: Pulmonary Disease

## 2020-01-15 DIAGNOSIS — J449 Chronic obstructive pulmonary disease, unspecified: Secondary | ICD-10-CM | POA: Diagnosis not present

## 2020-01-15 DIAGNOSIS — J129 Viral pneumonia, unspecified: Secondary | ICD-10-CM | POA: Diagnosis not present

## 2020-01-18 ENCOUNTER — Ambulatory Visit: Payer: Medicare HMO | Attending: Nurse Practitioner | Admitting: Nurse Practitioner

## 2020-01-18 ENCOUNTER — Other Ambulatory Visit: Payer: Self-pay

## 2020-01-18 ENCOUNTER — Encounter: Payer: Self-pay | Admitting: Nurse Practitioner

## 2020-01-18 DIAGNOSIS — E1165 Type 2 diabetes mellitus with hyperglycemia: Secondary | ICD-10-CM | POA: Diagnosis not present

## 2020-01-18 DIAGNOSIS — E1140 Type 2 diabetes mellitus with diabetic neuropathy, unspecified: Secondary | ICD-10-CM

## 2020-01-18 DIAGNOSIS — N521 Erectile dysfunction due to diseases classified elsewhere: Secondary | ICD-10-CM

## 2020-01-18 MED ORDER — SILDENAFIL CITRATE 100 MG PO TABS: 100.0000 mg | ORAL_TABLET | Freq: Every day | ORAL | 6 refills | Status: DC | PRN

## 2020-01-18 MED ORDER — VICTOZA 18 MG/3ML ~~LOC~~ SOPN: 1.8000 mg | PEN_INJECTOR | Freq: Every day | SUBCUTANEOUS | 6 refills | Status: DC

## 2020-01-18 MED ORDER — BD PEN NEEDLE MINI U/F 31G X 5 MM MISC: 6 refills | Status: AC
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
## 2020-01-22 ENCOUNTER — Other Ambulatory Visit: Payer: Self-pay | Admitting: Nurse Practitioner

## 2020-01-22 ENCOUNTER — Other Ambulatory Visit: Payer: Self-pay | Admitting: Family

## 2020-01-22 ENCOUNTER — Encounter: Payer: Self-pay | Admitting: Nurse Practitioner

## 2020-01-22 DIAGNOSIS — R7989 Other specified abnormal findings of blood chemistry: Secondary | ICD-10-CM

## 2020-01-22 DIAGNOSIS — B20 Human immunodeficiency virus [HIV] disease: Secondary | ICD-10-CM

## 2020-01-22 DIAGNOSIS — E349 Endocrine disorder, unspecified: Secondary | ICD-10-CM

## 2020-01-22 MED ORDER — VITAMIN D (ERGOCALCIFEROL) 1.25 MG (50000 UNIT) PO CAPS
50000.0000 [IU] | ORAL_CAPSULE | ORAL | 1 refills | Status: AC
Start: 2020-01-22 — End: ?

## 2020-01-23 ENCOUNTER — Other Ambulatory Visit: Payer: Medicare HMO

## 2020-01-23 ENCOUNTER — Telehealth: Payer: Self-pay

## 2020-01-23 NOTE — Telephone Encounter (Signed)
Attempted to call patient to schedule overdue appointment with office. Was supposed to follow up in December 2020, but did not schedule appt.  Left voicemail requesting he call office back. Will send mychart message as well. Lorenso Courier, [place]

## 2020-01-24 ENCOUNTER — Other Ambulatory Visit (HOSPITAL_COMMUNITY): Admission: RE | Admit: 2020-01-24 | Payer: Medicare HMO | Source: Ambulatory Visit

## 2020-01-26 ENCOUNTER — Encounter (HOSPITAL_BASED_OUTPATIENT_CLINIC_OR_DEPARTMENT_OTHER): Payer: Medicare HMO | Admitting: Pulmonary Disease

## 2020-01-27 ENCOUNTER — Other Ambulatory Visit: Payer: Self-pay

## 2020-01-30 ENCOUNTER — Other Ambulatory Visit: Payer: Self-pay | Admitting: Nurse Practitioner

## 2020-01-30 DIAGNOSIS — G8929 Other chronic pain: Secondary | ICD-10-CM

## 2020-01-30 MED ORDER — ACETAMINOPHEN-CODEINE 300-30 MG PO TABS: 1.0000 | ORAL_TABLET | Freq: Three times a day (TID) | ORAL | 0 refills | Status: DC | PRN

## 2020-02-14 DIAGNOSIS — J449 Chronic obstructive pulmonary disease, unspecified: Secondary | ICD-10-CM | POA: Diagnosis not present

## 2020-02-14 DIAGNOSIS — J129 Viral pneumonia, unspecified: Secondary | ICD-10-CM | POA: Diagnosis not present

## 2020-02-18 ENCOUNTER — Other Ambulatory Visit (HOSPITAL_COMMUNITY): Payer: Medicare HMO

## 2020-02-19 ENCOUNTER — Other Ambulatory Visit: Payer: Self-pay | Admitting: Family

## 2020-02-19 DIAGNOSIS — B20 Human immunodeficiency virus [HIV] disease: Secondary | ICD-10-CM

## 2020-02-20 ENCOUNTER — Other Ambulatory Visit: Payer: Self-pay

## 2020-02-20 ENCOUNTER — Ambulatory Visit (HOSPITAL_BASED_OUTPATIENT_CLINIC_OR_DEPARTMENT_OTHER): Payer: Medicare HMO | Attending: Pulmonary Disease | Admitting: Pulmonary Disease

## 2020-02-20 VITALS — Ht 69.0 in | Wt 240.0 lb

## 2020-02-28 ENCOUNTER — Other Ambulatory Visit: Payer: Self-pay | Admitting: Nurse Practitioner

## 2020-02-28 DIAGNOSIS — E118 Type 2 diabetes mellitus with unspecified complications: Secondary | ICD-10-CM

## 2020-02-28 DIAGNOSIS — G8929 Other chronic pain: Secondary | ICD-10-CM

## 2020-02-28 DIAGNOSIS — Z794 Long term (current) use of insulin: Secondary | ICD-10-CM

## 2020-02-28 DIAGNOSIS — R69 Illness, unspecified: Secondary | ICD-10-CM | POA: Diagnosis not present

## 2020-02-28 MED ORDER — ACETAMINOPHEN-CODEINE 300-30 MG PO TABS: 1.0000 | ORAL_TABLET | Freq: Three times a day (TID) | ORAL | 0 refills | Status: AC | PRN

## 2020-02-28 MED ORDER — ONETOUCH VERIO W/DEVICE KIT: 1.0000 | PACK | Freq: Two times a day (BID) | 0 refills | Status: AC

## 2020-03-05 ENCOUNTER — Ambulatory Visit (INDEPENDENT_AMBULATORY_CARE_PROVIDER_SITE_OTHER): Payer: Medicare HMO | Admitting: Podiatry

## 2020-03-05 ENCOUNTER — Encounter: Payer: Self-pay | Admitting: Podiatry

## 2020-03-05 ENCOUNTER — Other Ambulatory Visit: Payer: Self-pay

## 2020-03-05 DIAGNOSIS — M79675 Pain in left toe(s): Secondary | ICD-10-CM | POA: Diagnosis not present

## 2020-03-05 DIAGNOSIS — M2041 Other hammer toe(s) (acquired), right foot: Secondary | ICD-10-CM

## 2020-03-05 DIAGNOSIS — Z113 Encounter for screening for infections with a predominantly sexual mode of transmission: Secondary | ICD-10-CM

## 2020-03-05 DIAGNOSIS — L84 Corns and callosities: Secondary | ICD-10-CM | POA: Diagnosis not present

## 2020-03-05 DIAGNOSIS — M79674 Pain in right toe(s): Secondary | ICD-10-CM

## 2020-03-05 DIAGNOSIS — E1142 Type 2 diabetes mellitus with diabetic polyneuropathy: Secondary | ICD-10-CM

## 2020-03-05 DIAGNOSIS — B351 Tinea unguium: Secondary | ICD-10-CM | POA: Diagnosis not present

## 2020-03-05 DIAGNOSIS — M2042 Other hammer toe(s) (acquired), left foot: Secondary | ICD-10-CM

## 2020-03-05 DIAGNOSIS — B20 Human immunodeficiency virus [HIV] disease: Secondary | ICD-10-CM

## 2020-03-05 NOTE — Progress Notes (Signed)
Subjective: Chris [name]. presents today for follow up of preventative diabetic foot care and painful mycotic nails b/l that are difficult to trim. Pain interferes with ambulation. Aggravating factors include wearing enclosed shoe gear. Pain is relieved with periodic professional debridement.   He states he has been taking better care of his feet applying Amlactin Lotion and Vaseline Petroleum Jelly mixture.  He voices no new pedal concerns on today's visit.   Allergies  Allergen Reactions  . Bactrim [Sulfamethoxazole-Trimethoprim] Hives    Objective: There were no vitals filed for this visit.  Pt is a pleasant  54 y.o. year old AA  male, morbidly obese in NAD. AAO x 3.   Vascular Examination:  Capillary refill time to digits immediate b/l. Palpable DP pulses b/l. Palpable PT pulses b/l. Pedal hair absent b/l Skin temperature gradient within normal limits b/l.  Dermatological Examination: Pedal skin with normal turgor, texture and tone bilaterally. No open wounds bilaterally. No interdigital macerations bilaterally. Toenails 1-5 b/l elongated, dystrophic, thickened, crumbly with subungual debris and tenderness to dorsal palpation. Hyperkeratotic lesion(s) b/l hallux and submet head 4 left foot.  No erythema, no edema, no drainage, no flocculence.   Noted improvement in appearance of skin b/l feet.  Musculoskeletal: Normal muscle strength 5/5 to all lower extremity muscle groups bilaterally, no pain crepitus or joint limitation noted with ROM b/l and hammertoes noted to the  L 4th toe, L 5th toe, R 4th toe and R 5th toe  Neurological: Pt has subjective symptoms of neuropathy. Protective sensation intact 5/5 intact bilaterally with 10g monofilament b/l. Vibratory sensation intact b/l.  Assessment: 1. Pain due to onychomycosis of toenails of both feet   2. Callus   3. Acquired hammertoes of both feet   4. Diabetic peripheral neuropathy associated with type 2 diabetes mellitus  (HCC)     Plan: -No new findings. No new orders. -Continue diabetic foot care principles. Literature dispensed on today.  -Toenails 1-5 b/l were debrided in length and girth with sterile nail nippers and dremel without iatrogenic bleeding.  -Calluses submet head 4 left foot and b/l hallux were debrided without complication or incident. Total number debrided =3. -Patient to continue soft, supportive shoe gear daily. -Patient to report any pedal injuries to medical professional immediately. -Continue AmLactin Lotion 12% to be applied to feet twice daily mixing in a little Vaseline Petroleum Jelly after AmLactin Application. -Patient/POA to call should there be question/concern in the interim.  Return in about 3 months (around 06/05/2020) for diabetic nail and callus trim.

## 2020-03-07 ENCOUNTER — Other Ambulatory Visit: Payer: Self-pay

## 2020-03-07 ENCOUNTER — Other Ambulatory Visit: Payer: Medicare HMO

## 2020-03-07 ENCOUNTER — Telehealth: Payer: Self-pay | Admitting: *Deleted

## 2020-03-07 DIAGNOSIS — R69 Illness, unspecified: Secondary | ICD-10-CM | POA: Diagnosis not present

## 2020-03-07 DIAGNOSIS — Z113 Encounter for screening for infections with a predominantly sexual mode of transmission: Secondary | ICD-10-CM | POA: Diagnosis not present

## 2020-03-07 DIAGNOSIS — B20 Human immunodeficiency virus [HIV] disease: Secondary | ICD-10-CM

## 2020-03-07 NOTE — Telephone Encounter (Signed)
Patient here for labs, asked to speak with a nurse. He has been using his inhaler more often, does not swish or rinse his mouth afterward and has irritation/cracking at the corners of his mouth.  He is asking if there is anything that could help. Please advise. Andree Coss, RN

## 2020-03-08 LAB — T-HELPER CELL (CD4) - (RCID CLINIC ONLY)
CD4 % Helper T Cell: 13 % — ABNORMAL LOW (ref 33–65)
CD4 T Cell Abs: 313 /uL — ABNORMAL LOW (ref 400–1790)

## 2020-03-10 ENCOUNTER — Other Ambulatory Visit: Payer: Self-pay | Admitting: Nurse Practitioner

## 2020-03-10 DIAGNOSIS — E118 Type 2 diabetes mellitus with unspecified complications: Secondary | ICD-10-CM

## 2020-03-10 LAB — COMPLETE METABOLIC PANEL WITH GFR
AG Ratio: 1.1 (calc) (ref 1.0–2.5)
ALT: 11 U/L (ref 9–46)
AST: 14 U/L (ref 10–35)
Albumin: 3.9 g/dL (ref 3.6–5.1)
Alkaline phosphatase (APISO): 59 U/L (ref 35–144)
BUN: 12 mg/dL (ref 7–25)
CO2: 28 mmol/L (ref 20–32)
Calcium: 8.6 mg/dL (ref 8.6–10.3)
Chloride: 101 mmol/L (ref 98–110)
Creat: 1.31 mg/dL (ref 0.70–1.33)
GFR, Est African American: 72 mL/min/{1.73_m2} (ref >=60)
GFR, Est Non African American: 62 mL/min/{1.73_m2} (ref >=60)
Globulin: 3.6 g/dL (calc) (ref 1.9–3.7)
Glucose, Bld: 326 mg/dL — ABNORMAL HIGH (ref 65–99)
Potassium: 4.6 mmol/L (ref 3.5–5.3)
Sodium: 136 mmol/L (ref 135–146)
Total Bilirubin: 0.6 mg/dL (ref 0.2–1.2)
Total Protein: 7.5 g/dL (ref 6.1–8.1)

## 2020-03-10 LAB — HIV-1 RNA QUANT-NO REFLEX-BLD
HIV 1 RNA Quant: <20 copies/mL — AB
HIV-1 RNA Quant, Log: <1.3 Log copies/mL — AB

## 2020-03-10 LAB — CBC WITH DIFFERENTIAL/PLATELET
Absolute Monocytes: 700 cells/uL (ref 200–950)
Basophils Absolute: 63 cells/uL (ref 0–200)
Basophils Relative: 0.9 %
Eosinophils Absolute: 280 cells/uL (ref 15–500)
Eosinophils Relative: 4 %
HCT: 43.6 % (ref 38.5–50.0)
Hemoglobin: 13.7 g/dL (ref 13.2–17.1)
Lymphs Abs: 2569 cells/uL (ref 850–3900)
MCH: 29.9 pg (ref 27.0–33.0)
MCHC: 31.4 g/dL — ABNORMAL LOW (ref 32.0–36.0)
MCV: 95.2 fL (ref 80.0–100.0)
MPV: 11.3 fL (ref 7.5–12.5)
Monocytes Relative: 10 %
Neutro Abs: 3388 cells/uL (ref 1500–7800)
Neutrophils Relative %: 48.4 %
Platelets: 229 10*3/uL (ref 140–400)
RBC: 4.58 10*6/uL (ref 4.20–5.80)
RDW: 11.3 % (ref 11.0–15.0)
Total Lymphocyte: 36.7 %
WBC: 7 10*3/uL (ref 3.8–10.8)

## 2020-03-10 LAB — RPR: RPR Ser Ql: NONREACTIVE

## 2020-03-12 NOTE — Telephone Encounter (Signed)
If not improving with rinsing his mouth out, and using vaseline. would call pulmonary to see any suggestions

## 2020-03-13 NOTE — Telephone Encounter (Signed)
Attempted to call patient x 2. Unable to leave voicemail on first attempt, patient disconnected call on the 2nd attempt.   Will forward to [place] to address during visit next week. Andree Coss, RN

## 2020-03-15 ENCOUNTER — Telehealth: Payer: Self-pay | Admitting: Pulmonary Disease

## 2020-03-15 ENCOUNTER — Telehealth: Payer: Self-pay | Admitting: Internal Medicine

## 2020-03-15 MED ORDER — BEVESPI AEROSPHERE 9-4.8 MCG/ACT IN AERO: 2.0000 | INHALATION_SPRAY | Freq: Two times a day (BID) | RESPIRATORY_TRACT | 11 refills | Status: DC

## 2020-03-15 NOTE — Telephone Encounter (Signed)
Patient has lost his Bevespi inhaler. Insurance will not refill it until 03/21/2020. We do not have any samples. Patient is asking if he could have another kind of sample to hold him over. He is starting to feel the effects of being off his medication and using albuterol more frequently. Please advise.

## 2020-03-15 NOTE — Telephone Encounter (Signed)
ATC pt but vm is full, unable to leave message. Will attempt to call later.

## 2020-03-15 NOTE — Telephone Encounter (Signed)
Patient calling today to report he lost his Bevespi inhaler and needs a refill sent. Refill sent to preferred pharmacy.

## 2020-03-16 DIAGNOSIS — J129 Viral pneumonia, unspecified: Secondary | ICD-10-CM | POA: Diagnosis not present

## 2020-03-16 DIAGNOSIS — J449 Chronic obstructive pulmonary disease, unspecified: Secondary | ICD-10-CM | POA: Diagnosis not present

## 2020-03-16 MED ORDER — BREZTRI AEROSPHERE 160-9-4.8 MCG/ACT IN AERO
2.0000 | INHALATION_SPRAY | Freq: Two times a day (BID) | RESPIRATORY_TRACT | 0 refills | Status: DC
Start: 2020-03-16 — End: 2020-05-02

## 2020-03-16 NOTE — Telephone Encounter (Signed)
Patient contacted, sample for Breztri left for patient at the desk.

## 2020-03-16 NOTE — Telephone Encounter (Signed)
Can use breztri 2bid sample

## 2020-03-21 ENCOUNTER — Encounter: Payer: Self-pay | Admitting: Family

## 2020-03-21 ENCOUNTER — Ambulatory Visit (INDEPENDENT_AMBULATORY_CARE_PROVIDER_SITE_OTHER): Payer: Medicare HMO | Admitting: Family

## 2020-03-21 ENCOUNTER — Other Ambulatory Visit: Payer: Self-pay

## 2020-03-21 VITALS — BP 152/117 | HR 69 | Temp 98.2°F | Wt 245.0 lb

## 2020-03-21 DIAGNOSIS — Z79899 Other long term (current) drug therapy: Secondary | ICD-10-CM | POA: Diagnosis not present

## 2020-03-21 DIAGNOSIS — B20 Human immunodeficiency virus [HIV] disease: Secondary | ICD-10-CM | POA: Diagnosis not present

## 2020-03-21 DIAGNOSIS — B3781 Candidal esophagitis: Secondary | ICD-10-CM

## 2020-03-21 DIAGNOSIS — R69 Illness, unspecified: Secondary | ICD-10-CM | POA: Diagnosis not present

## 2020-03-21 DIAGNOSIS — Z0000 Encounter for general adult medical examination without abnormal findings: Secondary | ICD-10-CM | POA: Diagnosis not present

## 2020-03-21 MED ORDER — FLUCONAZOLE 200 MG PO TABS
200.0000 mg | ORAL_TABLET | Freq: Every day | ORAL | 0 refills | Status: DC
Start: 2020-03-21 — End: 2020-05-02

## 2020-03-21 MED ORDER — BIKTARVY 50-200-25 MG PO TABS: 1.0000 | ORAL_TABLET | Freq: Every day | ORAL | 3 refills | Status: DC
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
## 2020-03-21 NOTE — Assessment & Plan Note (Signed)
Chris [name] a 54 year old male with well-controlled HIV-1 disease likely obtained from heterosexual contact.  No signs/symptoms of opportunistic infection or progressive HIV disease.  He does have some mild thrush likely related to use of inhaled corticosteroids.  Discussed/reviewed lab work and plan of care.  Continue current dose of Biktarvy.  Plan for follow-up in 4 months or sooner if needed with lab work 1 to 2 weeks prior to appointment.

## 2020-03-21 NOTE — Assessment & Plan Note (Signed)
   Covid vaccination up-to-date per recommendations.  Referral placed to [facility] for routine dental care.   Discussed importance of safe sexual practice to reduce risk of STI.  Condoms declined.  Colonoscopy up-to-date for colon cancer screening.  All other immunizations are up-to-date per recommendations.

## 2020-03-21 NOTE — Patient Instructions (Addendum)
Nice to see you.  We will continue with your current dose of Biktarvy.  Refills have been sent to the pharmacy.  Fluconazole has been sent to the pharmacy.  Plan for follow up in 4 months or sooner if needed.   Have a great day and stay safe!

## 2020-03-21 NOTE — Assessment & Plan Note (Signed)
Appears to have symptoms consistent with Candida esophagitis/thrush as well as stomatitis.  Start fluconazole.  Encouraged to rinse mouth thoroughly and keep sore areas clean with soap and water.  Follow-up if symptoms worsen or do not improve.

## 2020-03-24 ENCOUNTER — Other Ambulatory Visit: Payer: Self-pay | Admitting: Family Medicine

## 2020-03-24 DIAGNOSIS — F4321 Adjustment disorder with depressed mood: Secondary | ICD-10-CM

## 2020-03-29 ENCOUNTER — Ambulatory Visit: Payer: Medicare HMO | Admitting: Cardiology

## 2020-04-03 ENCOUNTER — Telehealth: Payer: Self-pay | Admitting: Nurse Practitioner

## 2020-04-03 NOTE — Telephone Encounter (Signed)
Medication Refill - Medication: Acetaminophen-Codeine 300-30 MG tablet    Has the patient contacted their pharmacy? No. (Agent: If no, request that the patient contact the pharmacy for the refill.) (Agent: If yes, when and what did the pharmacy advise?)  Preferred Pharmacy (with phone number or street name): [id number] - [place], [place] - [address] [facility] [address]  [address] [place] [address]  Phone:  [phone] Fax:  [phone]   Agent: Please be advised that RX refills may take up to 3 business days. We ask that you follow-up with your pharmacy.

## 2020-04-04 DIAGNOSIS — R69 Illness, unspecified: Secondary | ICD-10-CM | POA: Diagnosis not present

## 2020-04-05 ENCOUNTER — Other Ambulatory Visit: Payer: Self-pay | Admitting: Nurse Practitioner

## 2020-04-05 DIAGNOSIS — G8929 Other chronic pain: Secondary | ICD-10-CM

## 2020-04-05 NOTE — Telephone Encounter (Signed)
Requested medication (s) are due for refill today: Yes  Requested medication (s) are on the active medication list: No  Last refill:  02/28/20  Future visit scheduled: Yes  Notes to clinic:  See request. Medication not on medication list.    Requested Prescriptions  Pending Prescriptions Disp Refills   Acetaminophen-Codeine 300-30 MG tablet [Pharmacy Med Name: ACETAMINOPHEN-COD #3 TABLET] 60 tablet 0    Sig: Take 1-2 tablets by mouth every 8 (eight) hours as needed for pain.      Not Delegated - Analgesics:  Opioid Agonist Combinations Failed - 04/05/2020 11:42 AM      Failed - This refill cannot be delegated      Failed - Urine Drug Screen completed in last 360 days.      Passed - Valid encounter within last 6 months    Recent Outpatient Visits           2 months ago Type 2 diabetes mellitus with hyperglycemia, without long-term current use of insulin [facility])   [facility] [place], Shea Stakes, NP   5 months ago Encounter for annual physical exam   [facility] [place], [place] W, NP   6 months ago Type 2 diabetes mellitus with complication, without long-term current use of insulin [facility])   [facility] [place], Shea Stakes, NP   8 months ago Pain, dental   [facility] Hoy Register, MD   1 year ago Chronic obstructive pulmonary disease with acute exacerbation [facility])   [place] [facility] Claiborne Rigg, NP       Future Appointments             In 2 weeks Claiborne Rigg, NP [facility]   In 3 months [place], Tama Headings, FNP [facility] for Infectious Disease, [facility]

## 2020-04-06 ENCOUNTER — Other Ambulatory Visit: Payer: Self-pay | Admitting: Nurse Practitioner

## 2020-04-06 MED ORDER — ALBUTEROL SULFATE HFA 108 (90 BASE) MCG/ACT IN AERS: 2.0000 | INHALATION_SPRAY | Freq: Four times a day (QID) | RESPIRATORY_TRACT | 2 refills | Status: DC | PRN

## 2020-04-06 NOTE — Telephone Encounter (Signed)
Medication: Acetaminophen-Codeine 300-30 MG tablet [[id number]]  DISCONTINUED, albuterol (PROVENTIL HFA;VENTOLIN HFA) 108 (90 Base) MCG/ACT inhaler [[id number]]   Has the patient contacted their pharmacy? YES  (Agent: If no, request that the patient contact the pharmacy for the refill.) (Agent: If yes, when and what did the pharmacy advise?)  Preferred Pharmacy (with phone number or street name): [id number] [place],  - [address]  Phone:  [phone] Fax:  [phone]     Agent: Please be advised that RX refills may take up to 3 business days. We ask that you follow-up with your pharmacy.

## 2020-04-06 NOTE — Telephone Encounter (Signed)
Requested medication (s) are due for refill today: yes  Requested medication (s) are on the active medication list: yes  Last refill: 07/12/18  #1 inhaler 5 refills  Future visit scheduled: yes  Notes to clinic: Historic provider    Requested Prescriptions  Pending Prescriptions Disp Refills   albuterol (VENTOLIN HFA) 108 (90 Base) MCG/ACT inhaler      Sig: Inhale 2 puffs into the lungs every 6 (six) hours as needed for wheezing or shortness of breath.      Pulmonology:  Beta Agonists Failed - 04/06/2020  9:48 AM      Failed - One inhaler should last at least one month. If the patient is requesting refills earlier, contact the patient to check for uncontrolled symptoms.      Passed - Valid encounter within last 12 months    Recent Outpatient Visits           2 months ago Type 2 diabetes mellitus with hyperglycemia, without long-term current use of insulin [facility])   [place] [facility] [place], Shea Stakes, NP   5 months ago Encounter for annual physical exam   [facility] [place], [place] W, NP   6 months ago Type 2 diabetes mellitus with complication, without long-term current use of insulin [facility])   [place] [facility] [place], Shea Stakes, NP   8 months ago Pain, dental   [facility] Hoy Register, MD   1 year ago Chronic obstructive pulmonary disease with acute exacerbation [facility])   [place] [facility] Claiborne Rigg, NP       Future Appointments             In 2 weeks Claiborne Rigg, NP [facility] [employer] [facility]   In 3 months [place], Tama Headings, FNP [facility] for Infectious Disease, [facility]

## 2020-04-09 ENCOUNTER — Other Ambulatory Visit: Payer: Self-pay | Admitting: Nurse Practitioner

## 2020-04-09 MED ORDER — ACETAMINOPHEN-CODEINE #3 300-30 MG PO TABS: 1.0000 | ORAL_TABLET | Freq: Two times a day (BID) | ORAL | 0 refills | Status: DC | PRN

## 2020-04-09 NOTE — Telephone Encounter (Signed)
This medication was never routed to me. The medication has been refilled.

## 2020-04-09 NOTE — Telephone Encounter (Signed)
Pt called to check the status of his refill request from last week for Acetaminophen-Codeine 300-30 MG tablet Pt stated he is in pain and needs refill asap / please advise

## 2020-04-13 ENCOUNTER — Other Ambulatory Visit: Payer: Self-pay | Admitting: Nurse Practitioner

## 2020-04-16 DIAGNOSIS — J449 Chronic obstructive pulmonary disease, unspecified: Secondary | ICD-10-CM | POA: Diagnosis not present

## 2020-04-16 DIAGNOSIS — J129 Viral pneumonia, unspecified: Secondary | ICD-10-CM | POA: Diagnosis not present

## 2020-04-20 ENCOUNTER — Ambulatory Visit: Payer: Medicare HMO | Attending: Nurse Practitioner | Admitting: Family Medicine

## 2020-04-20 ENCOUNTER — Other Ambulatory Visit: Payer: Self-pay

## 2020-04-20 ENCOUNTER — Encounter: Payer: Self-pay | Admitting: Family Medicine

## 2020-04-20 ENCOUNTER — Ambulatory Visit: Payer: Medicare HMO | Admitting: Nurse Practitioner

## 2020-04-20 VITALS — BP 138/90 | HR 95 | Temp 97.0°F | Resp 20 | Ht 69.0 in | Wt 247.2 lb

## 2020-04-20 DIAGNOSIS — M62838 Other muscle spasm: Secondary | ICD-10-CM | POA: Diagnosis not present

## 2020-04-20 DIAGNOSIS — M5441 Lumbago with sciatica, right side: Secondary | ICD-10-CM | POA: Diagnosis not present

## 2020-04-20 DIAGNOSIS — E1165 Type 2 diabetes mellitus with hyperglycemia: Secondary | ICD-10-CM | POA: Diagnosis not present

## 2020-04-20 DIAGNOSIS — G8929 Other chronic pain: Secondary | ICD-10-CM

## 2020-04-20 DIAGNOSIS — M62830 Muscle spasm of back: Secondary | ICD-10-CM | POA: Diagnosis not present

## 2020-04-20 DIAGNOSIS — I10 Essential (primary) hypertension: Secondary | ICD-10-CM | POA: Diagnosis not present

## 2020-04-20 LAB — GLUCOSE, POCT (MANUAL RESULT ENTRY): POC Glucose: 140 mg/dL — AB (ref 70–99)

## 2020-04-20 LAB — POCT GLYCOSYLATED HEMOGLOBIN (HGB A1C): HbA1c, POC (controlled diabetic range): 8.2 % — AB (ref 0.0–7.0)

## 2020-04-20 MED ORDER — BACLOFEN 10 MG PO TABS: 10.0000 mg | ORAL_TABLET | Freq: Two times a day (BID) | ORAL | 11 refills | Status: DC
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
## 2020-04-20 NOTE — Progress Notes (Signed)
Needs refill on Baclofen

## 2020-04-23 ENCOUNTER — Encounter: Payer: Self-pay | Admitting: Family Medicine

## 2020-04-30 ENCOUNTER — Inpatient Hospital Stay (HOSPITAL_COMMUNITY)
Admission: EM | Admit: 2020-04-30 | Discharge: 2020-05-04 | DRG: 291 | Disposition: A | Discharge status: Home / Self Care | Payer: Medicare HMO | Attending: Internal Medicine | Admitting: Internal Medicine

## 2020-04-30 ENCOUNTER — Emergency Department (HOSPITAL_COMMUNITY): Payer: Medicare HMO

## 2020-04-30 ENCOUNTER — Other Ambulatory Visit: Payer: Self-pay

## 2020-04-30 ENCOUNTER — Encounter (HOSPITAL_COMMUNITY): Payer: Self-pay | Admitting: *Deleted

## 2020-04-30 DIAGNOSIS — I480 Paroxysmal atrial fibrillation: Secondary | ICD-10-CM | POA: Diagnosis present

## 2020-04-30 DIAGNOSIS — F329 Major depressive disorder, single episode, unspecified: Secondary | ICD-10-CM | POA: Diagnosis present

## 2020-04-30 DIAGNOSIS — F17210 Nicotine dependence, cigarettes, uncomplicated: Secondary | ICD-10-CM | POA: Diagnosis present

## 2020-04-30 DIAGNOSIS — J439 Emphysema, unspecified: Secondary | ICD-10-CM | POA: Diagnosis present

## 2020-04-30 DIAGNOSIS — Z881 Allergy status to other antibiotic agents status: Secondary | ICD-10-CM

## 2020-04-30 DIAGNOSIS — E1165 Type 2 diabetes mellitus with hyperglycemia: Secondary | ICD-10-CM | POA: Diagnosis not present

## 2020-04-30 DIAGNOSIS — Z794 Long term (current) use of insulin: Secondary | ICD-10-CM

## 2020-04-30 DIAGNOSIS — I10 Essential (primary) hypertension: Secondary | ICD-10-CM | POA: Diagnosis not present

## 2020-04-30 DIAGNOSIS — Z825 Family history of asthma and other chronic lower respiratory diseases: Secondary | ICD-10-CM | POA: Diagnosis not present

## 2020-04-30 DIAGNOSIS — I110 Hypertensive heart disease with heart failure: Principal | ICD-10-CM | POA: Diagnosis present

## 2020-04-30 DIAGNOSIS — J441 Chronic obstructive pulmonary disease with (acute) exacerbation: Secondary | ICD-10-CM | POA: Diagnosis present

## 2020-04-30 DIAGNOSIS — R69 Illness, unspecified: Secondary | ICD-10-CM | POA: Diagnosis not present

## 2020-04-30 DIAGNOSIS — Z20822 Contact with and (suspected) exposure to covid-19: Secondary | ICD-10-CM | POA: Diagnosis not present

## 2020-04-30 DIAGNOSIS — Z21 Asymptomatic human immunodeficiency virus [HIV] infection status: Secondary | ICD-10-CM | POA: Diagnosis present

## 2020-04-30 DIAGNOSIS — K219 Gastro-esophageal reflux disease without esophagitis: Secondary | ICD-10-CM | POA: Diagnosis present

## 2020-04-30 DIAGNOSIS — Z9981 Dependence on supplemental oxygen: Secondary | ICD-10-CM | POA: Diagnosis not present

## 2020-04-30 DIAGNOSIS — B20 Human immunodeficiency virus [HIV] disease: Secondary | ICD-10-CM | POA: Diagnosis not present

## 2020-04-30 DIAGNOSIS — Z833 Family history of diabetes mellitus: Secondary | ICD-10-CM | POA: Diagnosis not present

## 2020-04-30 DIAGNOSIS — R0602 Shortness of breath: Secondary | ICD-10-CM | POA: Diagnosis not present

## 2020-04-30 DIAGNOSIS — I5031 Acute diastolic (congestive) heart failure: Secondary | ICD-10-CM | POA: Diagnosis present

## 2020-04-30 DIAGNOSIS — F419 Anxiety disorder, unspecified: Secondary | ICD-10-CM | POA: Diagnosis present

## 2020-04-30 DIAGNOSIS — E119 Type 2 diabetes mellitus without complications: Secondary | ICD-10-CM

## 2020-04-30 DIAGNOSIS — E785 Hyperlipidemia, unspecified: Secondary | ICD-10-CM | POA: Diagnosis present

## 2020-04-30 DIAGNOSIS — Z79899 Other long term (current) drug therapy: Secondary | ICD-10-CM

## 2020-04-30 DIAGNOSIS — Z7901 Long term (current) use of anticoagulants: Secondary | ICD-10-CM

## 2020-04-30 DIAGNOSIS — Z20828 Contact with and (suspected) exposure to other viral communicable diseases: Secondary | ICD-10-CM | POA: Diagnosis not present

## 2020-04-30 DIAGNOSIS — I517 Cardiomegaly: Secondary | ICD-10-CM | POA: Diagnosis not present

## 2020-04-30 DIAGNOSIS — J9621 Acute and chronic respiratory failure with hypoxia: Secondary | ICD-10-CM | POA: Diagnosis present

## 2020-04-30 DIAGNOSIS — Z8701 Personal history of pneumonia (recurrent): Secondary | ICD-10-CM

## 2020-04-30 DIAGNOSIS — E1169 Type 2 diabetes mellitus with other specified complication: Secondary | ICD-10-CM | POA: Diagnosis present

## 2020-04-30 DIAGNOSIS — J9601 Acute respiratory failure with hypoxia: Secondary | ICD-10-CM | POA: Diagnosis present

## 2020-04-30 DIAGNOSIS — Z8249 Family history of ischemic heart disease and other diseases of the circulatory system: Secondary | ICD-10-CM | POA: Diagnosis not present

## 2020-04-30 DIAGNOSIS — R05 Cough: Secondary | ICD-10-CM | POA: Diagnosis not present

## 2020-04-30 DIAGNOSIS — Z6835 Body mass index (BMI) 35.0-35.9, adult: Secondary | ICD-10-CM

## 2020-04-30 DIAGNOSIS — I4891 Unspecified atrial fibrillation: Secondary | ICD-10-CM | POA: Diagnosis present

## 2020-04-30 DIAGNOSIS — R079 Chest pain, unspecified: Secondary | ICD-10-CM | POA: Diagnosis not present

## 2020-04-30 DIAGNOSIS — E669 Obesity, unspecified: Secondary | ICD-10-CM | POA: Diagnosis present

## 2020-04-30 LAB — BASIC METABOLIC PANEL
Anion gap: 7 (ref 5–15)
BUN: 21 mg/dL — ABNORMAL HIGH (ref 6–20)
CO2: 29 mmol/L (ref 22–32)
Calcium: 9.7 mg/dL (ref 8.9–10.3)
Chloride: 100 mmol/L (ref 98–111)
Creatinine, Ser: 1.44 mg/dL — ABNORMAL HIGH (ref 0.61–1.24)
GFR calc Af Amer: >60 mL/min (ref >60)
GFR calc non Af Amer: 55 mL/min — ABNORMAL LOW (ref >60)
Glucose, Bld: 208 mg/dL — ABNORMAL HIGH (ref 70–99)
Potassium: 4.1 mmol/L (ref 3.5–5.1)
Sodium: 136 mmol/L (ref 135–145)

## 2020-04-30 LAB — MRSA PCR SCREENING: MRSA by PCR: NEGATIVE

## 2020-04-30 LAB — BRAIN NATRIURETIC PEPTIDE: B Natriuretic Peptide: 88.7 pg/mL (ref 0.0–100.0)

## 2020-04-30 LAB — MAGNESIUM: Magnesium: 2.1 mg/dL (ref 1.7–2.4)

## 2020-04-30 LAB — CBC
HCT: 49.5 % (ref 39.0–52.0)
Hemoglobin: 14.6 g/dL (ref 13.0–17.0)
MCH: 28.5 pg (ref 26.0–34.0)
MCHC: 29.5 g/dL — ABNORMAL LOW (ref 30.0–36.0)
MCV: 96.7 fL (ref 80.0–100.0)
Platelets: 233 10*3/uL (ref 150–400)
RBC: 5.12 MIL/uL (ref 4.22–5.81)
RDW: 12 % (ref 11.5–15.5)
WBC: 9.1 10*3/uL (ref 4.0–10.5)
nRBC: 0 % (ref 0.0–0.2)

## 2020-04-30 LAB — GLUCOSE, CAPILLARY
Glucose-Capillary: 209 mg/dL — ABNORMAL HIGH (ref 70–99)
Glucose-Capillary: 365 mg/dL — ABNORMAL HIGH (ref 70–99)

## 2020-04-30 LAB — TROPONIN I (HIGH SENSITIVITY)
Troponin I (High Sensitivity): 14 ng/L (ref <18)
Troponin I (High Sensitivity): 14 ng/L (ref <18)

## 2020-04-30 LAB — SARS CORONAVIRUS 2 BY RT PCR (HOSPITAL ORDER, PERFORMED IN ~~LOC~~ HOSPITAL LAB): SARS Coronavirus 2: NEGATIVE

## 2020-04-30 LAB — TSH: TSH: 1.408 u[IU]/mL (ref 0.350–4.500)

## 2020-04-30 LAB — HEMOGLOBIN A1C
Hgb A1c MFr Bld: 8.4 % — ABNORMAL HIGH (ref 4.8–5.6)
Mean Plasma Glucose: 194.38 mg/dL

## 2020-04-30 MED ORDER — APIXABAN 5 MG PO TABS
5.0000 mg | ORAL_TABLET | Freq: Once | ORAL | Status: AC
  Administered 2020-04-30: 5 mg via ORAL
  Filled 2020-04-30: qty 1

## 2020-04-30 MED ORDER — INSULIN ASPART 100 UNIT/ML ~~LOC~~ SOLN
0.0000 [IU] | Freq: Three times a day (TID) | SUBCUTANEOUS | Status: DC
  Administered 2020-04-30: 5 [IU] via SUBCUTANEOUS
  Administered 2020-05-01 (×2): 8 [IU] via SUBCUTANEOUS
  Administered 2020-05-02: 5 [IU] via SUBCUTANEOUS
  Administered 2020-05-02: 8 [IU] via SUBCUTANEOUS

## 2020-04-30 MED ORDER — ONDANSETRON HCL 4 MG/2ML IJ SOLN: 4.0000 mg | Freq: Four times a day (QID) | INTRAMUSCULAR | Status: DC | PRN

## 2020-04-30 MED ORDER — METHYLPREDNISOLONE SODIUM SUCC 125 MG IJ SOLR
125.0000 mg | Freq: Once | INTRAMUSCULAR | Status: AC
  Administered 2020-04-30: 125 mg via INTRAVENOUS
  Filled 2020-04-30: qty 2

## 2020-04-30 MED ORDER — ALBUTEROL SULFATE HFA 108 (90 BASE) MCG/ACT IN AERS
2.0000 | INHALATION_SPRAY | RESPIRATORY_TRACT | Status: DC | PRN
  Administered 2020-04-30 – 2020-05-01 (×2): 2 via RESPIRATORY_TRACT
  Filled 2020-04-30: qty 6.7

## 2020-04-30 MED ORDER — BUDESONIDE 0.5 MG/2ML IN SUSP
2.0000 mg | Freq: Four times a day (QID) | RESPIRATORY_TRACT | Status: DC
  Administered 2020-04-30: 2 mg via RESPIRATORY_TRACT
  Filled 2020-04-30 (×4): qty 8

## 2020-04-30 MED ORDER — BUDESONIDE 0.5 MG/2ML IN SUSP
0.5000 mg | Freq: Two times a day (BID) | RESPIRATORY_TRACT | Status: DC
  Administered 2020-05-01 – 2020-05-04 (×7): 0.5 mg via RESPIRATORY_TRACT
  Filled 2020-04-30 (×7): qty 2

## 2020-04-30 MED ORDER — ACETAMINOPHEN 325 MG PO TABS
650.0000 mg | ORAL_TABLET | ORAL | Status: DC | PRN
  Administered 2020-05-01: 650 mg via ORAL
  Filled 2020-04-30: qty 2

## 2020-04-30 MED ORDER — MORPHINE SULFATE (PF) 4 MG/ML IV SOLN
4.0000 mg | Freq: Once | INTRAVENOUS | Status: AC
  Administered 2020-04-30: 4 mg via INTRAVENOUS
  Filled 2020-04-30: qty 1

## 2020-04-30 MED ORDER — DILTIAZEM HCL-DEXTROSE 125-5 MG/125ML-% IV SOLN (PREMIX)
5.0000 mg/h | INTRAVENOUS | Status: DC
  Administered 2020-04-30 – 2020-05-01 (×2): 5 mg/h via INTRAVENOUS
  Administered 2020-05-01 – 2020-05-02 (×2): 10 mg/h via INTRAVENOUS
  Filled 2020-04-30 (×5): qty 125

## 2020-04-30 MED ORDER — SODIUM CHLORIDE 0.9 % IV SOLN
500.0000 mg | INTRAVENOUS | Status: AC
  Administered 2020-04-30: 500 mg via INTRAVENOUS
  Filled 2020-04-30: qty 500

## 2020-04-30 MED ORDER — IPRATROPIUM-ALBUTEROL 0.5-2.5 (3) MG/3ML IN SOLN: 3.0000 mL | Freq: Four times a day (QID) | RESPIRATORY_TRACT | Status: DC

## 2020-04-30 MED ORDER — IPRATROPIUM-ALBUTEROL 0.5-2.5 (3) MG/3ML IN SOLN
3.0000 mL | RESPIRATORY_TRACT | Status: DC
  Administered 2020-04-30 (×3): 3 mL via RESPIRATORY_TRACT
  Filled 2020-04-30 (×3): qty 3

## 2020-04-30 MED ORDER — AZITHROMYCIN 500 MG PO TABS
500.0000 mg | ORAL_TABLET | Freq: Every day | ORAL | Status: AC
  Administered 2020-05-01 – 2020-05-04 (×4): 500 mg via ORAL
  Filled 2020-04-30 (×4): qty 1

## 2020-04-30 MED ORDER — DILTIAZEM LOAD VIA INFUSION
20.0000 mg | Freq: Once | INTRAVENOUS | Status: AC
  Administered 2020-04-30: 20 mg via INTRAVENOUS
  Filled 2020-04-30: qty 20

## 2020-04-30 MED ORDER — PREDNISONE 20 MG PO TABS
40.0000 mg | ORAL_TABLET | Freq: Every day | ORAL | Status: DC
  Administered 2020-05-01 – 2020-05-04 (×4): 40 mg via ORAL
  Filled 2020-04-30 (×5): qty 2

## 2020-04-30 MED ORDER — INSULIN ASPART 100 UNIT/ML ~~LOC~~ SOLN
0.0000 [IU] | Freq: Every day | SUBCUTANEOUS | Status: DC
  Administered 2020-04-30: 5 [IU] via SUBCUTANEOUS
  Administered 2020-05-01: 3 [IU] via SUBCUTANEOUS
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
## 2020-04-30 NOTE — ED Triage Notes (Signed)
The pt arrived by [facility] from home  For 4 days the pt has had body aches temp chest pain cough  He is on home 02 at 2  He reports that he feels like he has pneumonia that he has had x 3 in the past  He has had the pfizer vaccine both injections
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
## 2020-05-01 ENCOUNTER — Other Ambulatory Visit: Payer: Self-pay

## 2020-05-01 ENCOUNTER — Inpatient Hospital Stay (HOSPITAL_COMMUNITY): Payer: Medicare HMO

## 2020-05-01 ENCOUNTER — Encounter (HOSPITAL_COMMUNITY): Payer: Self-pay | Admitting: Internal Medicine

## 2020-05-01 DIAGNOSIS — B20 Human immunodeficiency virus [HIV] disease: Secondary | ICD-10-CM

## 2020-05-01 DIAGNOSIS — J441 Chronic obstructive pulmonary disease with (acute) exacerbation: Secondary | ICD-10-CM

## 2020-05-01 DIAGNOSIS — J9621 Acute and chronic respiratory failure with hypoxia: Secondary | ICD-10-CM

## 2020-05-01 DIAGNOSIS — I10 Essential (primary) hypertension: Secondary | ICD-10-CM

## 2020-05-01 DIAGNOSIS — I4891 Unspecified atrial fibrillation: Secondary | ICD-10-CM

## 2020-05-01 LAB — LIPID PANEL
Cholesterol: 247 mg/dL — ABNORMAL HIGH (ref 0–200)
HDL: 38 mg/dL — ABNORMAL LOW (ref >40)
LDL Cholesterol: 193 mg/dL — ABNORMAL HIGH (ref 0–99)
Total CHOL/HDL Ratio: 6.5 RATIO
Triglycerides: 80 mg/dL (ref <150)
VLDL: 16 mg/dL (ref 0–40)

## 2020-05-01 LAB — CBC
HCT: 48.5 % (ref 39.0–52.0)
Hemoglobin: 14.7 g/dL (ref 13.0–17.0)
MCH: 29.1 pg (ref 26.0–34.0)
MCHC: 30.3 g/dL (ref 30.0–36.0)
MCV: 96 fL (ref 80.0–100.0)
Platelets: 222 10*3/uL (ref 150–400)
RBC: 5.05 MIL/uL (ref 4.22–5.81)
RDW: 12 % (ref 11.5–15.5)
WBC: 9.1 10*3/uL (ref 4.0–10.5)
nRBC: 0 % (ref 0.0–0.2)

## 2020-05-01 LAB — GLUCOSE, CAPILLARY
Glucose-Capillary: 252 mg/dL — ABNORMAL HIGH (ref 70–99)
Glucose-Capillary: 252 mg/dL — ABNORMAL HIGH (ref 70–99)
Glucose-Capillary: 444 mg/dL — ABNORMAL HIGH (ref 70–99)
Glucose-Capillary: 253 mg/dL — ABNORMAL HIGH (ref 70–99)

## 2020-05-01 LAB — BASIC METABOLIC PANEL
Anion gap: 7 (ref 5–15)
BUN: 21 mg/dL — ABNORMAL HIGH (ref 6–20)
CO2: 29 mmol/L (ref 22–32)
Calcium: 9.3 mg/dL (ref 8.9–10.3)
Chloride: 99 mmol/L (ref 98–111)
Creatinine, Ser: 1.3 mg/dL — ABNORMAL HIGH (ref 0.61–1.24)
GFR calc Af Amer: >60 mL/min (ref >60)
GFR calc non Af Amer: >60 mL/min (ref >60)
Glucose, Bld: 281 mg/dL — ABNORMAL HIGH (ref 70–99)
Potassium: 4.9 mmol/L (ref 3.5–5.1)
Sodium: 135 mmol/L (ref 135–145)

## 2020-05-01 LAB — HIV ANTIBODY (ROUTINE TESTING W REFLEX): HIV Screen 4th Generation wRfx: REACTIVE — AB

## 2020-05-01 LAB — ECHOCARDIOGRAM COMPLETE
Area-P 1/2: 4.46 cm2
Height: 69 in
S' Lateral: 3.1 cm
Weight: 3894.21 oz

## 2020-05-01 LAB — PROCALCITONIN: Procalcitonin: 0.16 ng/mL

## 2020-05-01 MED ORDER — APIXABAN 5 MG PO TABS
5.0000 mg | ORAL_TABLET | Freq: Two times a day (BID) | ORAL | Status: DC
  Administered 2020-05-01 – 2020-05-04 (×6): 5 mg via ORAL
  Filled 2020-05-01 (×6): qty 1

## 2020-05-01 MED ORDER — IPRATROPIUM-ALBUTEROL 0.5-2.5 (3) MG/3ML IN SOLN
3.0000 mL | Freq: Three times a day (TID) | RESPIRATORY_TRACT | Status: DC
  Administered 2020-05-01 – 2020-05-03 (×6): 3 mL via RESPIRATORY_TRACT
  Filled 2020-05-01 (×6): qty 3

## 2020-05-01 MED ORDER — BICTEGRAVIR-EMTRICITAB-TENOFOV 50-200-25 MG PO TABS
1.0000 | ORAL_TABLET | Freq: Every day | ORAL | Status: DC
  Administered 2020-05-02 – 2020-05-04 (×3): 1 via ORAL
  Filled 2020-05-01 (×3): qty 1

## 2020-05-01 MED ORDER — PERFLUTREN LIPID MICROSPHERE
1.0000 mL | INTRAVENOUS | Status: AC | PRN
  Administered 2020-05-01: 4 mL via INTRAVENOUS
  Filled 2020-05-01: qty 10

## 2020-05-01 MED ORDER — FUROSEMIDE 10 MG/ML IJ SOLN
20.0000 mg | Freq: Two times a day (BID) | INTRAMUSCULAR | Status: DC
  Administered 2020-05-01 (×2): 20 mg via INTRAVENOUS
  Filled 2020-05-01 (×2): qty 2

## 2020-05-01 MED ORDER — IPRATROPIUM-ALBUTEROL 0.5-2.5 (3) MG/3ML IN SOLN
3.0000 mL | Freq: Four times a day (QID) | RESPIRATORY_TRACT | Status: DC
  Administered 2020-05-01: 3 mL via RESPIRATORY_TRACT
  Filled 2020-05-01: qty 3

## 2020-05-01 MED ORDER — ENSURE MAX PROTEIN PO LIQD
11.0000 [oz_av] | Freq: Two times a day (BID) | ORAL | Status: DC
  Administered 2020-05-01: 11 [oz_av] via ORAL
  Filled 2020-05-01: qty 330

## 2020-05-01 MED ORDER — INSULIN ASPART 100 UNIT/ML ~~LOC~~ SOLN
30.0000 [IU] | Freq: Once | SUBCUTANEOUS | Status: AC
  Administered 2020-05-01: 30 [IU] via SUBCUTANEOUS

## 2020-05-01 MED ORDER — ALBUTEROL SULFATE (2.5 MG/3ML) 0.083% IN NEBU: 2.5000 mg | INHALATION_SOLUTION | RESPIRATORY_TRACT | Status: DC | PRN

## 2020-05-01 MED ORDER — INSULIN DETEMIR 100 UNIT/ML ~~LOC~~ SOLN
10.0000 [IU] | Freq: Two times a day (BID) | SUBCUTANEOUS | Status: DC
  Administered 2020-05-01 (×2): 10 [IU] via SUBCUTANEOUS
  Filled 2020-05-01 (×4): qty 0.1

## 2020-05-01 MED ORDER — GLUCERNA SHAKE PO LIQD
237.0000 mL | Freq: Two times a day (BID) | ORAL | Status: DC
  Administered 2020-05-02 – 2020-05-04 (×5): 237 mL via ORAL

## 2020-05-01 NOTE — Progress Notes (Signed)
PROGRESS NOTE  Chris [name]. ZOX:[id number] DOB: 10/28/1965 DOA: 04/30/2020 PCP: Claiborne Rigg, NP  HPI/Recap of past 20 hours: 54 year old male with past medical history of HIV, morbid obesity, COPD with chronic respiratory failure on 2 L nasal cannula, I & D paroxysmal atrial fibrillation who presented to the emergency room on 9/20 with 3 days of increasing shortness of breath and productive cough.  Ruled out for Covid and patient has been fully vaccinated, patient found to be hypoxic initially requiring 4 L and eventually bumped up to 10 L high flow.  Also found to be in rapid atrial fibrillation.  Patient was admitted to the hospital service for COPD exacerbation and atrial fibrillation with RVR.  Placed in stepdown unit on Cardizem drip plus nebulizers, steroids, supplemental oxygen.  This morning, patient states she is feeling somewhat better.  Breathing a little bit easier.  Denies any pain.  Assessment/Plan: Principal Problem:   Acute on chronic respiratory failure with hypoxia (HCC) secondary to COPD exacerbation and suspected underlying sleep apnea and diastolic heart failure: Patient's x-ray notes cardiomegaly although no florid signs of edema.  Although with diastolic heart failure, BNP can be normal.  Have started IV Lasix.  We will also start nightly CPAP.  Patient states he tried that in the past, but it felt too confining.  We will try nasal mask.  Recheck echocardiogram.  1 done several years ago was unrevealing. Active Problems:   HIV disease (HCC): Followed regularly at HIV clinic.  Continue antiretrovirals    Cigarette smoker: Counseled to quit.  Still smokes at least 2 cigarettes a day.    Essential hypertension, benign: Continue home medications.  Blood pressure overall stable.    Morbid obesity due to excess calories (HCC) complicated by dm/hyperlipidemia: Meets criteria BMI greater than 35+ comorbidities of diabetes, hyperlipidemia and hypertension     GERD  (gastroesophageal reflux disease): Continue PPI.    Diabetes mellitus type 2, uncontrolled in obese, on insulin (HCC): A1c at 8.3.  Expect to see some increase given steroids.    Atrial fibrillation with RVR (HCC): Rate controlled, on Cardizem drip.  We will try to wean off, as we improve his oxygenation, this should greatly improve.  Continue anticoagulation.    Code Status: Full code  Family Communication: Left message for wife  Disposition Plan: Hopefully discharge in a few days once he is back down to baseline O2   Consultants:  None  Procedures:  Echocardiogram ordered  Antimicrobials:  IV Rocephin and Zithromax 9/20-present  DVT prophylaxis: Eliquis   Objective: Vitals:   05/01/20 0850 05/01/20 0851  BP:    Pulse:    Resp:    Temp:    SpO2: 94% 94%    Intake/Output Summary (Last 24 hours) at 05/01/2020 1017 Last data filed at 05/01/2020 0811 Gross per 24 hour  Intake 596.19 ml  Output 2100 ml  Net -1503.81 ml   Filed Weights   04/30/20 0743 05/01/20 0300  Weight: 111.1 kg 110.4 kg   Body mass index is 35.94 kg/m.  Exam:   General: Alert and oriented x3, no acute distress  HEENT: Normocephalic, atraumatic, mucous membranes are moist  Neck: Thick, narrow airway  Cardiovascular: Irregular rhythm, rate controlled  Respiratory: Decreased breath sounds throughout, moderate airway flow, minimal end expiratory wheeze on left upper lobe  Abdomen: Soft, obese, nontender, positive bowel sounds  Musculoskeletal: No clubbing or cyanosis, trace pitting edema  Skin: No skin breaks, tears or lesions  Neuro: No  focal deficits  Psychiatry: Appropriate, no evidence of psychoses   Data Reviewed: CBC: Recent Labs  Lab 04/30/20 0737 05/01/20 0126  WBC 9.1 9.1  HGB 14.6 14.7  HCT 49.5 48.5  MCV 96.7 96.0  PLT 233 222   Basic Metabolic Panel: Recent Labs  Lab 04/30/20 0737 04/30/20 1116 05/01/20 0126  NA 136  --  135  K 4.1  --  4.9  CL  100  --  99  CO2 29  --  29  GLUCOSE 208*  --  281*  BUN 21*  --  21*  CREATININE 1.44*  --  1.30*  CALCIUM 9.7  --  9.3  MG  --  2.1  --    GFR: Estimated Creatinine Clearance: 79.6 mL/min (A) (by C-G formula based on SCr of 1.3 mg/dL (H)). Liver Function Tests: No results for input(s): AST, ALT, ALKPHOS, BILITOT, PROT, ALBUMIN in the last 168 hours. No results for input(s): LIPASE, AMYLASE in the last 168 hours. No results for input(s): AMMONIA in the last 168 hours. Coagulation Profile: No results for input(s): INR, PROTIME in the last 168 hours. Cardiac Enzymes: No results for input(s): CKTOTAL, CKMB, CKMBINDEX, TROPONINI in the last 168 hours. BNP (last 3 results) No results for input(s): PROBNP in the last 8760 hours. HbA1C: Recent Labs    04/30/20 1553  HGBA1C 8.4*   CBG: Recent Labs  Lab 04/30/20 1641 04/30/20 2115 05/01/20 0629  GLUCAP 209* 365* 252*   Lipid Profile: Recent Labs    05/01/20 0126  CHOL 247*  HDL 38*  LDLCALC 193*  TRIG 80  CHOLHDL 6.5   Thyroid Function Tests: Recent Labs    04/30/20 1553  TSH 1.408   Anemia Panel: No results for input(s): VITAMINB12, FOLATE, FERRITIN, TIBC, IRON, RETICCTPCT in the last 72 hours. Urine analysis:    Component Value Date/Time   COLORURINE YELLOW 01/21/2018 1830   APPEARANCEUR CLEAR 01/21/2018 1830   LABSPEC 1.013 01/21/2018 1830   PHURINE 6.0 01/21/2018 1830   GLUCOSEU 50 (A) 01/21/2018 1830   HGBUR NEGATIVE 01/21/2018 1830   BILIRUBINUR negative 11/24/2018 1013   BILIRUBINUR neg 11/12/2016 0946   KETONESUR negative 11/24/2018 1013   KETONESUR NEGATIVE 01/21/2018 1830   PROTEINUR NEGATIVE 01/21/2018 1830   UROBILINOGEN 1.0 11/24/2018 1013   UROBILINOGEN 1 09/21/2014 1205   NITRITE Negative 11/24/2018 1013   NITRITE NEGATIVE 01/21/2018 1830   LEUKOCYTESUR Negative 11/24/2018 1013   Sepsis Labs: @LABRCNTIP (procalcitonin:4,lacticidven:4)  ) Recent Results (from the past 240 hour(s))    SARS Coronavirus 2 by RT PCR (hospital order, performed in [facility] hospital lab) Nasopharyngeal Nasopharyngeal Swab     Status: None   Collection Time: 04/30/20 12:04 PM   Specimen: Nasopharyngeal Swab  Result Value Ref Range Status   SARS Coronavirus 2 NEGATIVE NEGATIVE Final    Comment: (NOTE) SARS-CoV-2 target nucleic acids are NOT DETECTED.  The SARS-CoV-2 RNA is generally detectable in upper and lower respiratory specimens during the acute phase of infection. The lowest concentration of SARS-CoV-2 viral copies this assay can detect is 250 copies / mL. A negative result does not preclude SARS-CoV-2 infection and should not be used as the sole basis for treatment or other patient management decisions.  A negative result may occur with improper specimen collection / handling, submission of specimen other than nasopharyngeal swab, presence of viral mutation(s) within the areas targeted by this assay, and inadequate number of viral copies (<250 copies / mL). A negative result must be combined  with clinical observations, patient history, and epidemiological information.  Fact Sheet for Patients:   BoilerBrush.com.cy  Fact Sheet for Healthcare Providers: https://pope.com/  This test is not yet approved or  cleared by the [place] FDA and has been authorized for detection and/or diagnosis of SARS-CoV-2 by FDA under an Emergency Use Authorization (EUA).  This EUA will remain in effect (meaning this test can be used) for the duration of the COVID-19 declaration under Section 564(b)(1) of the Act, 21 U.S.C. section 360bbb-3(b)(1), unless the authorization is terminated or revoked sooner.  Performed at [facility], [address], [place], [place] [address]   MRSA PCR Screening     Status: None   Collection Time: 04/30/20  8:37 PM   Specimen: Nasal Mucosa; Nasopharyngeal  Result Value Ref Range Status   MRSA by PCR  NEGATIVE NEGATIVE Final    Comment:        The GeneXpert MRSA Assay (FDA approved for NASAL specimens only), is one component of a comprehensive MRSA colonization surveillance program. It is not intended to diagnose MRSA infection nor to guide or monitor treatment for MRSA infections. Performed at [facility], [address] [place], [place] [address]       Studies: No results found.  Scheduled Meds:  azithromycin  500 mg Oral Daily   budesonide (PULMICORT) nebulizer solution  0.5 mg Nebulization BID   furosemide  20 mg Intravenous Q12H   insulin aspart  0-15 Units Subcutaneous TID WC   insulin aspart  0-5 Units Subcutaneous QHS   ipratropium-albuterol  3 mL Nebulization QID   predniSONE  40 mg Oral Q breakfast    Continuous Infusions:  diltiazem (CARDIZEM) infusion 5 mg/hr (05/01/20 0500)     LOS: 1 day     Hollice Espy, MD [facility]   05/01/2020, 10:17 AM

## 2020-05-01 NOTE — Evaluation (Signed)
Physical Therapy Evaluation Patient Details Name: Chris [name]. MRN: [id number] DOB: 1966-06-09 Today's Date: 05/01/2020   History of Present Illness  Chris [name]. is a 54 y.o. male with PMH including paroxysmal A. fib on Eliquis, hypertension, hyperlipidemia, DM, HIV, depression/anxiety, GERD, chronic hypoxic respiratory failure secondary to COPD-on 2 L of oxygen at home, obesity presented to ED with worsening cough, shortness of breath, admitted with A. fib with RVR and acute on chronic hypoxemic respiratory failure.  Clinical Impression  Pt doing well with mobility and no further PT needed. Recommend pt continue to mobilize in room and hallways on his own. Will need assistance to get lines/tubes set up initially.       Follow Up Recommendations No PT follow up    Equipment Recommendations  None recommended by PT    Recommendations for Other Services       Precautions / Restrictions Precautions Precautions: Other (comment) (lines/tubes) Restrictions Weight Bearing Restrictions: No      Mobility  Bed Mobility               General bed mobility comments: Pt sitting EOB  Transfers Overall transfer level: Modified independent Equipment used: None                Ambulation/Gait Ambulation/Gait assistance: Supervision Gait Distance (Feet): 450 Feet Assistive device: IV Pole Gait Pattern/deviations: Step-through pattern;Decreased stride length   Gait velocity interpretation: >2.62 ft/sec, indicative of community ambulatory General Gait Details: Steady gait. Supervision only for multiple lines and tubes  Stairs            Wheelchair Mobility    Modified Rankin (Stroke Patients Only)       Balance Overall balance assessment: No apparent balance deficits (not formally assessed)                                           Pertinent Vitals/Pain Pain Assessment: 0-10 Pain Score: 7  Pain Location: back and knees Pain  Descriptors / Indicators: Sore Pain Intervention(s): Limited activity within patient's tolerance;Patient requesting pain meds-RN notified    Home Living Family/patient expects to be discharged to:: Private residence Living Arrangements: Spouse/significant other Available Help at Discharge: Family;Available PRN/intermittently Type of Home: Apartment Home Access: Stairs to enter     Home Layout: One level Home Equipment: Environmental consultant - 2 wheels;Bedside commode Additional Comments: home O2 at 2L    Prior Function Level of Independence: Independent               Hand Dominance   Dominant Hand: Right    Extremity/Trunk Assessment   Upper Extremity Assessment Upper Extremity Assessment: Defer to OT evaluation    Lower Extremity Assessment Lower Extremity Assessment: Overall WFL for tasks assessed       Communication   Communication: No difficulties  Cognition Arousal/Alertness: Awake/alert Behavior During Therapy: WFL for tasks assessed/performed Overall Cognitive Status: Within Functional Limits for tasks assessed                                        General Comments General comments (skin integrity, edema, etc.): Pt on 10L of O2 with SpO2 > 90%    Exercises     Assessment/Plan    PT Assessment Patent does not need any further PT  services  PT Problem List         PT Treatment Interventions      PT Goals (Current goals can be found in the Care Plan section)  Acute Rehab PT Goals PT Goal Formulation: All assessment and education complete, DC therapy    Frequency     Barriers to discharge        Co-evaluation               AM-PAC PT "6 Clicks" Mobility  Outcome Measure Help needed turning from your back to your side while in a flat bed without using bedrails?: None Help needed moving from lying on your back to sitting on the side of a flat bed without using bedrails?: None Help needed moving to and from a bed to a chair  (including a wheelchair)?: None Help needed standing up from a chair using your arms (e.g., wheelchair or bedside chair)?: None Help needed to walk in hospital room?: None Help needed climbing 3-5 steps with a railing? : None 6 Click Score: 24    End of Session Equipment Utilized During Treatment: Oxygen Activity Tolerance: Patient tolerated treatment well Patient left: in bed;with call bell/phone within reach (sitting EOB) Nurse Communication: Mobility status;Patient requests pain meds PT Visit Diagnosis: Other abnormalities of gait and mobility (R26.89)    Time: 9833-8250 PT Time Calculation (min) (ACUTE ONLY): 19 min   Charges:   PT Evaluation $PT Eval Moderate Complexity: 1 Mod          [facility] PT Acute Rehabilitation Services Pager [phone] Office [phone]   Angelina Ok [facility] 05/01/2020, 12:03 PM

## 2020-05-01 NOTE — Progress Notes (Signed)
Initial Nutrition Assessment  DOCUMENTATION CODES:   Obesity unspecified  INTERVENTION:   - Snacks BID between meals, RD to order via [employer] diet software  - Ensure Max po BID, each supplement provides 150 kcal and 30 grams of protein  NUTRITION DIAGNOSIS:   Increased nutrient needs related to chronic illness (CHF, COPD) as evidenced by estimated needs.  GOAL:   Patient will meet greater than or equal to 90% of their needs  MONITOR:   PO intake, Supplement acceptance, Labs, Weight trends, I & O's  REASON FOR ASSESSMENT:   Consult Assessment of nutrition requirement/status  ASSESSMENT:   54 year old male who presented on 9/20 with cough and SOB. PMH of atrial fibrillation, HTN, HLD, T2DM, HIV, depression, anxiety, GERD, COPD, CHF.   Spoke with pt at bedside. He is complaining that he is hungry and that he is not receiving enough food to eat at mealtimes. RD and NT provided pt with graham crackers and peanut butter. Also offered cereal with milk but pt refused. Pt asking when lunch will be delivered. RD will order snacks BID between meals for pt.  Pt reports that he typically has a good appetite and eats well (2 meals daily). Pt reports that his evening meal is usually his largest.  Pt endorses recently losing weight from 280 lbs to 240 lbs. He reports that he was sick and not eating well. He is glad that he lost weight because now he is "healthier" per his report.  Reviewed weight history in chart. Pt with a 6.4 kg weight loss since 11/17/19. This is a 5.5% weight loss in about 5.5 months which is not significant for timeframe.  Meal Completion: 100%  Medications reviewed and include: Lasix, SSI, prednisone, cardizem  Labs reviewed: cholesterol 247, HLD 38, LDL 193 CBG's: 209-365 x 24 hours  UOP: 1850 ml x 24 hours I/O's: -1.5 L since admit  NUTRITION - FOCUSED PHYSICAL EXAM:    Most Recent Value  Orbital Region No depletion  Upper Arm Region No depletion   Thoracic and Lumbar Region No depletion  Buccal Region No depletion  Temple Region No depletion  Clavicle Bone Region No depletion  Clavicle and Acromion Bone Region No depletion  Scapular Bone Region No depletion  Dorsal Hand No depletion  Patellar Region No depletion  Anterior Thigh Region No depletion  Posterior Calf Region No depletion  Edema (RD Assessment) None  Hair Reviewed  Eyes Reviewed  Mouth Reviewed  Skin Reviewed  Nails Reviewed       Diet Order:   Diet Order            Diet heart healthy/carb modified Room service appropriate? Yes; Fluid consistency: Thin  Diet effective now                 EDUCATION NEEDS:   Education needs have been addressed  Skin:  Skin Assessment: Reviewed RN Assessment  Last BM:  no documented BM  Height:   Ht Readings from Last 1 Encounters:  04/30/20 5\' 9"  (1.753 m)    Weight:   Wt Readings from Last 1 Encounters:  05/01/20 110.4 kg    Ideal Body Weight:  72.7 kg  BMI:  Body mass index is 35.94 kg/m.  Estimated Nutritional Needs:   Kcal:  2300-2500  Protein:  115-130 grams  Fluid:  >/= 2.0 L    05/03/20, MS, RD, LDN Inpatient Clinical Dietitian Please see [facility] for contact information.

## 2020-05-01 NOTE — Progress Notes (Signed)
Inpatient Diabetes Program Recommendations  AACE/[name]: New Consensus Statement on Inpatient Glycemic Control (2015)  Target Ranges:  Prepandial:   less than 140 mg/dL      Peak postprandial:   less than 180 mg/dL (1-2 hours)      Critically ill patients:  140 - 180 mg/dL   Lab Results  Component Value Date   GLUCAP 252 (H) 05/01/2020   HGBA1C 8.4 (H) 04/30/2020    Review of Glycemic Control Results for Chris [name], Chris [name] (MRN [id number]) as of 05/01/2020 10:19  Ref. Range 04/30/2020 16:41 04/30/2020 21:15 05/01/2020 06:29  Glucose-Capillary Latest Ref Range: 70 - 99 mg/dL 063 (H) 016 (H) 010 (H)   Diabetes history: DM 2 Outpatient Diabetes medications:  Victoza 1.8 mg daily Current orders for Inpatient glycemic control:  Novolog moderate tid with meals and HS Prednisone 40 mg daily  Inpatient Diabetes Program Recommendations:    May consider adding Levemir 10 units bid.  Also consider adding Novolog 5 units tid with meals while on steroids.   Thanks  Beryl Meager, RN, BC-ADM Inpatient Diabetes Coordinator Pager [phone] (8a-5p)

## 2020-05-01 NOTE — Plan of Care (Signed)

## 2020-05-01 NOTE — Evaluation (Signed)
Occupational Therapy Evaluation Patient Details Name: Chris [name]. MRN: [id number] DOB: [birth year] Today's Date: 05/01/2020    History of Present Illness [name] Gienger [name]. is a 54 y.o. male with PMH including paroxysmal A. fib on Eliquis, hypertension, hyperlipidemia, DM, HIV, depression/anxiety, GERD, chronic hypoxic respiratory failure secondary to COPD-on 2 L of oxygen at home, obesity presented to ED with worsening cough, shortness of breath, admitted with A. fib with RVR and acute on chronic hypoxemic respiratory failure.   Clinical Impression   PTA Pt independent at home in ADL and mobility. Pt able to demonstrate LB ADL, UB ADL grooming. Pt at baseline with only exception being assist to manage lines and tubing. Pt on 8L O2 via HFNC throughout session. Pt would benefit from shower chair as he states that he often gets dizzy (and he takes luke warm showers) to [employer] safety and independence in bathing. 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 OT education complete. No OT follow up needed. OT to sign off.     Follow Up Recommendations  No OT follow up    Equipment Recommendations  Tub/shower seat (or 3 in1 to be used as shower chair)    Recommendations for Other Services       Precautions / Restrictions Precautions Precautions: Other (comment) (lines/tubes) Restrictions Weight Bearing Restrictions: No      Mobility Bed Mobility Overal bed mobility: Modified Independent             General bed mobility comments: Pt able to come to sitting from supine without any assist  Transfers Overall transfer level: Modified independent Equipment used: None                  Balance Overall balance assessment: No apparent balance deficits (not formally assessed)                                         ADL either performed or assessed with clinical judgement   ADL Overall ADL's : At baseline                                        General ADL Comments: Pt able to demonstrate transfers, grooming. HOwever, Pt with decreased activity tolerance to complete these tasks. Feel that a shower chair will be the best to maximize safety and independence in the shower     Vision Patient Visual Report: No change from baseline       Perception     Praxis      Pertinent Vitals/Pain Pain Assessment: 0-10 Pain Score: 2  Pain Location: back and knees Pain Descriptors / Indicators: Sore Pain Intervention(s): Limited activity within patient's tolerance;Monitored during session;Repositioned (had heat pack in room)     Hand Dominance Right   Extremity/Trunk Assessment Upper Extremity Assessment Upper Extremity Assessment: Overall WFL for tasks assessed   Lower Extremity Assessment Lower Extremity Assessment: Overall WFL for tasks assessed   Cervical / Trunk Assessment Cervical / Trunk Assessment: Normal   Communication Communication Communication: No difficulties   Cognition Arousal/Alertness: Awake/alert Behavior During Therapy: WFL for tasks assessed/performed Overall Cognitive Status: Within Functional Limits for tasks assessed  General Comments  Pt on 8L O2 via HFNC    Exercises     Shoulder Instructions      Home Living Family/patient expects to be discharged to:: Private residence Living Arrangements: Spouse/significant other Available Help at Discharge: Family;Available PRN/intermittently Type of Home: Apartment Home Access: Stairs to enter     Home Layout: One level     Bathroom Shower/Tub: Chief Strategy Officer: Standard     Home Equipment: Environmental consultant - 2 wheels;Bedside commode   Additional Comments: home O2 at 2L      Prior Functioning/Environment Level of Independence: Independent                 OT Problem List: Decreased activity tolerance;Cardiopulmonary status limiting activity      OT Treatment/Interventions:       OT Goals(Current goals can be found in the care plan section) Acute Rehab OT Goals Patient Stated Goal: to get this cough finished OT Goal Formulation: With patient Time For Goal Achievement: 05/15/20 Potential to Achieve Goals: Good  OT Frequency:     Barriers to D/C:            Co-evaluation              AM-PAC OT "6 Clicks" Daily Activity     Outcome Measure Help from another person eating meals?: A Little Help from another person taking care of personal grooming?: A Little Help from another person toileting, which includes using toliet, bedpan, or urinal?: A Little Help from another person bathing (including washing, rinsing, drying)?: A Little Help from another person to put on and taking off regular upper body clothing?: A Little Help from another person to put on and taking off regular lower body clothing?: A Little 6 Click Score: 18   End of Session Equipment Utilized During Treatment: Oxygen (8L HFNC) Nurse Communication: Mobility status  Activity Tolerance: Patient tolerated treatment well Patient left: in bed;with call bell/phone within reach (sitting EOB)  OT Visit Diagnosis: Muscle weakness (generalized) (M62.81)                Time: 7106-2694 OT Time Calculation (min): 21 min Charges:  OT General Charges $OT Visit: 1 Visit OT Evaluation $OT Eval Moderate Complexity: 1 Mod  Nyoka Cowden OTR/L Acute Rehabilitation Services Pager: [phone] Office: [phone]  [name] Brilyn Tuller 05/01/2020, 2:25 PM

## 2020-05-01 NOTE — Progress Notes (Signed)
  Echocardiogram 2D Echocardiogram has been performed.  Chris [name] 05/01/2020, 2:53 PM

## 2020-05-02 DIAGNOSIS — I5031 Acute diastolic (congestive) heart failure: Secondary | ICD-10-CM

## 2020-05-02 LAB — GLUCOSE, CAPILLARY
Glucose-Capillary: 218 mg/dL — ABNORMAL HIGH (ref 70–99)
Glucose-Capillary: 269 mg/dL — ABNORMAL HIGH (ref 70–99)
Glucose-Capillary: 501 mg/dL (ref 70–99)
Glucose-Capillary: 329 mg/dL — ABNORMAL HIGH (ref 70–99)
Glucose-Capillary: 240 mg/dL — ABNORMAL HIGH (ref 70–99)

## 2020-05-02 MED ORDER — SODIUM CHLORIDE 0.9 % IV SOLN
1.0000 g | INTRAVENOUS | Status: DC
  Administered 2020-05-02 – 2020-05-04 (×3): 1 g via INTRAVENOUS
  Filled 2020-05-02 (×3): qty 10

## 2020-05-02 MED ORDER — INSULIN ASPART 100 UNIT/ML ~~LOC~~ SOLN
0.0000 [IU] | SUBCUTANEOUS | Status: DC
  Administered 2020-05-02: 15 [IU] via SUBCUTANEOUS
  Administered 2020-05-03: 4 [IU] via SUBCUTANEOUS
  Administered 2020-05-03: 7 [IU] via SUBCUTANEOUS
  Administered 2020-05-03: 11 [IU] via SUBCUTANEOUS

## 2020-05-02 MED ORDER — INSULIN ASPART 100 UNIT/ML ~~LOC~~ SOLN
35.0000 [IU] | Freq: Once | SUBCUTANEOUS | Status: AC
  Administered 2020-05-02: 35 [IU] via SUBCUTANEOUS

## 2020-05-02 MED ORDER — INSULIN DETEMIR 100 UNIT/ML ~~LOC~~ SOLN
20.0000 [IU] | Freq: Two times a day (BID) | SUBCUTANEOUS | Status: DC
  Administered 2020-05-03 – 2020-05-04 (×4): 20 [IU] via SUBCUTANEOUS
  Filled 2020-05-02 (×6): qty 0.2

## 2020-05-02 MED ORDER — INSULIN ASPART 100 UNIT/ML ~~LOC~~ SOLN
5.0000 [IU] | Freq: Three times a day (TID) | SUBCUTANEOUS | Status: DC
  Administered 2020-05-03 – 2020-05-04 (×4): 5 [IU] via SUBCUTANEOUS

## 2020-05-02 MED ORDER — FUROSEMIDE 10 MG/ML IJ SOLN
40.0000 mg | Freq: Two times a day (BID) | INTRAMUSCULAR | Status: DC
  Administered 2020-05-02 – 2020-05-03 (×3): 40 mg via INTRAVENOUS
  Filled 2020-05-02 (×3): qty 4

## 2020-05-02 MED ORDER — DILTIAZEM HCL ER COATED BEADS 240 MG PO CP24
240.0000 mg | ORAL_CAPSULE | Freq: Every day | ORAL | Status: DC
  Administered 2020-05-02 – 2020-05-03 (×2): 240 mg via ORAL
  Filled 2020-05-02 (×2): qty 1

## 2020-05-02 MED ORDER — ATORVASTATIN CALCIUM 10 MG PO TABS
10.0000 mg | ORAL_TABLET | Freq: Every day | ORAL | Status: DC
  Administered 2020-05-02 – 2020-05-04 (×3): 10 mg via ORAL
  Filled 2020-05-02 (×3): qty 1

## 2020-05-02 MED ORDER — INSULIN DETEMIR 100 UNIT/ML ~~LOC~~ SOLN
14.0000 [IU] | Freq: Two times a day (BID) | SUBCUTANEOUS | Status: DC
  Administered 2020-05-02: 14 [IU] via SUBCUTANEOUS
  Filled 2020-05-02 (×2): qty 0.14

## 2020-05-02 MED ORDER — DULOXETINE HCL 30 MG PO CPEP
30.0000 mg | ORAL_CAPSULE | Freq: Every day | ORAL | Status: DC
  Administered 2020-05-02 – 2020-05-04 (×3): 30 mg via ORAL
  Filled 2020-05-02 (×3): qty 1

## 2020-05-02 NOTE — Progress Notes (Signed)
PROGRESS NOTE  Chris [name]. AUQ:[id number] DOB: 1965-12-22 DOA: 04/30/2020 PCP: Claiborne Rigg, NP  HPI/Recap of past 36 hours: 54 year old male with past medical history of HIV, morbid obesity, COPD with chronic respiratory failure on 2 L nasal cannula, I & D paroxysmal atrial fibrillation who presented to the emergency room on 9/20 with 3 days of increasing shortness of breath and productive cough.  Ruled out for Covid and patient has been fully vaccinated, patient found to be hypoxic initially requiring 4 L and eventually bumped up to 10 L high flow.  Also found to be in rapid atrial fibrillation.  Patient was admitted to the hospital service for COPD exacerbation and atrial fibrillation with RVR.  Placed in stepdown unit on Cardizem drip plus nebulizers, steroids, supplemental oxygen.  Felt to also be in diastolic heart failure.  Started on IV Lasix & has diuresed almost 4L.  Breathing a little easier today and down to 8 Lit.  Some productive cough with yellowish sputum.  Assessment/Plan: Principal Problem:   Acute on chronic respiratory failure with hypoxia (HCC) secondary to COPD exacerbation, acute diastolic heart failure and suspected underlying sleep apnea: Patient's x-ray notes cardiomegaly although no florid signs of edema.  Although with diastolic heart failure, BNP can be normal.  Have started IV Lasix & he has responded with significant diuresis and down 2 pounds.  Refused CPAP.  He will need a referral for an outpatient sleep study.  Echocardiogram noted preserved ejection fraction, but did not comment on diastolic function.  Continue Lasix, increasing to 40mg  twice a day.  Minimally elevated procalcitonin, enough to keep abx going during hospitalization. Active Problems:   HIV disease (HCC): Followed regularly at HIV clinic.  Continue antiretrovirals    Cigarette smoker: Counseled to quit.  Still smokes at least 2 cigarettes a day.    Essential hypertension, benign: Continue  home medications.  Blood pressure overall stable, improved as he is diuresing.  Hyperlipidemia:LDL at 163.  Will start low dose statin.    Morbid obesity due to excess calories (HCC) complicated by dm/hyperlipidemia: Meets criteria BMI greater than 35+ comorbidities of diabetes, hyperlipidemia and hypertension     GERD (gastroesophageal reflux disease): Continue PPI.    Diabetes mellitus type 2, uncontrolled in obese, on insulin (HCC): A1c at 8.3.  Have been titrating up insulin.  Apparently his wife has been bringing him in outside food.  Only on oral steroids.    Atrial fibrillation with RVR (HCC): Rate mostly controlled, on Cardizem drip.  Have restarted home cardizem.  We will try to wean off, as we improve his oxygenation, this should greatly improve.  Continue anticoagulation.  Code Status: Full code  Family Communication: Left message for wife  Disposition Plan: Hopefully discharge in a few days once he is back down to baseline O2   Consultants:  None  Procedures:  Echocardiogram done 9/21: Preserved ejection fraction.  No comment on diastolic dysfunction  Antimicrobials:  IV Rocephin and Zithromax 9/20-present  DVT prophylaxis: Eliquis   Objective: Vitals:   05/02/20 0828 05/02/20 0829  BP:    Pulse:    Resp:    Temp:    SpO2: 92% 92%    Intake/Output Summary (Last 24 hours) at 05/02/2020 1019 Last data filed at 05/02/2020 0900 Gross per 24 hour  Intake 787.51 ml  Output 3675 ml  Net -2887.49 ml   Filed Weights   04/30/20 0743 05/01/20 0300 05/02/20 0500  Weight: 111.1 kg 110.4 kg 110.6 kg  Body mass index is 36.01 kg/m.  Exam:   General: Alert and oriented x3, no acute distress  HEENT: Normocephalic, atraumatic, mucous membranes are moist  Neck: Thick, narrow airway  Cardiovascular: Irregular rhythm, borderline tachycardia  Respiratory: Decreased breath sounds throughout, moderate airway flow, wheezing resolved  Abdomen: Soft, obese,  nontender, positive bowel sounds  Musculoskeletal: No clubbing or cyanosis, trace pitting edema  Skin: No skin breaks, tears or lesions  Neuro: No focal deficits  Psychiatry: Appropriate, no evidence of psychoses   Data Reviewed: CBC: Recent Labs  Lab 04/30/20 0737 05/01/20 0126  WBC 9.1 9.1  HGB 14.6 14.7  HCT 49.5 48.5  MCV 96.7 96.0  PLT 233 222   Basic Metabolic Panel: Recent Labs  Lab 04/30/20 0737 04/30/20 1116 05/01/20 0126  NA 136  --  135  K 4.1  --  4.9  CL 100  --  99  CO2 29  --  29  GLUCOSE 208*  --  281*  BUN 21*  --  21*  CREATININE 1.44*  --  1.30*  CALCIUM 9.7  --  9.3  MG  --  2.1  --    GFR: Estimated Creatinine Clearance: 79.7 mL/min (A) (by C-G formula based on SCr of 1.3 mg/dL (H)). Liver Function Tests: No results for input(s): AST, ALT, ALKPHOS, BILITOT, PROT, ALBUMIN in the last 168 hours. No results for input(s): LIPASE, AMYLASE in the last 168 hours. No results for input(s): AMMONIA in the last 168 hours. Coagulation Profile: No results for input(s): INR, PROTIME in the last 168 hours. Cardiac Enzymes: No results for input(s): CKTOTAL, CKMB, CKMBINDEX, TROPONINI in the last 168 hours. BNP (last 3 results) No results for input(s): PROBNP in the last 8760 hours. HbA1C: Recent Labs    04/30/20 1553  HGBA1C 8.4*   CBG: Recent Labs  Lab 05/01/20 0629 05/01/20 1047 05/01/20 1619 05/01/20 2057 05/02/20 0602  GLUCAP 252* 252* 444* 253* 218*   Lipid Profile: Recent Labs    05/01/20 0126  CHOL 247*  HDL 38*  LDLCALC 193*  TRIG 80  CHOLHDL 6.5   Thyroid Function Tests: Recent Labs    04/30/20 1553  TSH 1.408   Anemia Panel: No results for input(s): VITAMINB12, FOLATE, FERRITIN, TIBC, IRON, RETICCTPCT in the last 72 hours. Urine analysis:    Component Value Date/Time   COLORURINE YELLOW 01/21/2018 1830   APPEARANCEUR CLEAR 01/21/2018 1830   LABSPEC 1.013 01/21/2018 1830   PHURINE 6.0 01/21/2018 1830   GLUCOSEU  50 (A) 01/21/2018 1830   HGBUR NEGATIVE 01/21/2018 1830   BILIRUBINUR negative 11/24/2018 1013   BILIRUBINUR neg 11/12/2016 0946   KETONESUR negative 11/24/2018 1013   KETONESUR NEGATIVE 01/21/2018 1830   PROTEINUR NEGATIVE 01/21/2018 1830   UROBILINOGEN 1.0 11/24/2018 1013   UROBILINOGEN 1 09/21/2014 1205   NITRITE Negative 11/24/2018 1013   NITRITE NEGATIVE 01/21/2018 1830   LEUKOCYTESUR Negative 11/24/2018 1013   Sepsis Labs: @LABRCNTIP (procalcitonin:4,lacticidven:4)  ) Recent Results (from the past 240 hour(s))  SARS Coronavirus 2 by RT PCR (hospital order, performed in [facility] hospital lab) Nasopharyngeal Nasopharyngeal Swab     Status: None   Collection Time: 04/30/20 12:04 PM   Specimen: Nasopharyngeal Swab  Result Value Ref Range Status   SARS Coronavirus 2 NEGATIVE NEGATIVE Final    Comment: (NOTE) SARS-CoV-2 target nucleic acids are NOT DETECTED.  The SARS-CoV-2 RNA is generally detectable in upper and lower respiratory specimens during the acute phase of infection. The lowest concentration of SARS-CoV-2  viral copies this assay can detect is 250 copies / mL. A negative result does not preclude SARS-CoV-2 infection and should not be used as the sole basis for treatment or other patient management decisions.  A negative result may occur with improper specimen collection / handling, submission of specimen other than nasopharyngeal swab, presence of viral mutation(s) within the areas targeted by this assay, and inadequate number of viral copies (<250 copies / mL). A negative result must be combined with clinical observations, patient history, and epidemiological information.  Fact Sheet for Patients:   BoilerBrush.com.cy  Fact Sheet for Healthcare Providers: https://pope.com/  This test is not yet approved or  cleared by the [place] FDA and has been authorized for detection and/or diagnosis of SARS-CoV-2  by FDA under an Emergency Use Authorization (EUA).  This EUA will remain in effect (meaning this test can be used) for the duration of the COVID-19 declaration under Section 564(b)(1) of the Act, 21 U.S.C. section 360bbb-3(b)(1), unless the authorization is terminated or revoked sooner.  Performed at [facility], [address] [place], [place] [address]   MRSA PCR Screening     Status: None   Collection Time: 04/30/20  8:37 PM   Specimen: Nasal Mucosa; Nasopharyngeal  Result Value Ref Range Status   MRSA by PCR NEGATIVE NEGATIVE Final    Comment:        The GeneXpert MRSA Assay (FDA approved for NASAL specimens only), is one component of a comprehensive MRSA colonization surveillance program. It is not intended to diagnose MRSA infection nor to guide or monitor treatment for MRSA infections. Performed at [facility], [address] [place], [place] [address]       Studies: ECHOCARDIOGRAM COMPLETE  Result Date: 05/01/2020    ECHOCARDIOGRAM REPORT   Patient Name:   Chris [name]. Date of Exam: 05/01/2020 Medical Rec #:  [id number]         Height:       69.0 in Accession #:    [id number]        Weight:       243.4 lb Date of Birth:  1966-04-16          BSA:          2.246 m Patient Age:    54 years          BP:           164/82 mmHg Patient Gender: M                 HR:           96 bpm. Exam Location:  Inpatient Procedure: 2D Echo, Cardiac Doppler, Color Doppler and Intracardiac            Opacification Agent Indications:    I48.91* Unspeicified atrial fibrillation  History:        Patient has prior history of Echocardiogram examinations, most                 recent 10/05/2017. Abnormal ECG, COPD, Arrythmias:Atrial                 Fibrillation; Risk Factors:Hypertension, Dyslipidemia, Diabetes                 and Sleep Apnea. HIV. Respiratory failure.  Sonographer:    Sheralyn Boatman RDCS Referring Phys: [id number] Kasandra Knudsen [facility]  Sonographer Comments: Technically challenging  study due to limited acoustic windows, Technically difficult study due to  poor echo windows, suboptimal parasternal window, suboptimal apical window, suboptimal subcostal window and patient is morbidly obese.  Image acquisition challenging due to patient body habitus. IMPRESSIONS  1. Left ventricular ejection fraction, by estimation, is 55 to 60%. The left ventricle has normal function. The left ventricle has no regional wall motion abnormalities. Left ventricular diastolic function could not be evaluated.  2. Right ventricular systolic function is normal. The right ventricular size is normal. Tricuspid regurgitation signal is inadequate for assessing PA pressure.  3. The mitral valve is normal in structure. No evidence of mitral valve regurgitation. No evidence of mitral stenosis.  4. The aortic valve is normal in structure. Aortic valve regurgitation is not visualized. No aortic stenosis is present.  5. The inferior vena cava is dilated in size with >50% respiratory variability, suggesting right atrial pressure of 8 mmHg. FINDINGS  Left Ventricle: Left ventricular ejection fraction, by estimation, is 55 to 60%. The left ventricle has normal function. The left ventricle has no regional wall motion abnormalities. Definity contrast agent was given IV to delineate the left ventricular  endocardial borders. The left ventricular internal cavity size was normal in size. There is no left ventricular hypertrophy. Left ventricular diastolic function could not be evaluated due to atrial fibrillation. Left ventricular diastolic function could  not be evaluated. Right Ventricle: The right ventricular size is normal. No increase in right ventricular wall thickness. Right ventricular systolic function is normal. Tricuspid regurgitation signal is inadequate for assessing PA pressure. Left Atrium: Left atrial size was normal in size. Right Atrium: Right atrial size was normal in size. Pericardium: There is no evidence of  pericardial effusion. Mitral Valve: The mitral valve is normal in structure. No evidence of mitral valve regurgitation. No evidence of mitral valve stenosis. Tricuspid Valve: The tricuspid valve is normal in structure. Tricuspid valve regurgitation is not demonstrated. No evidence of tricuspid stenosis. Aortic Valve: The aortic valve is normal in structure. Aortic valve regurgitation is not visualized. No aortic stenosis is present. Pulmonic Valve: The pulmonic valve was normal in structure. Pulmonic valve regurgitation is not visualized. No evidence of pulmonic stenosis. Aorta: The aortic root is normal in size and structure. Venous: The inferior vena cava is dilated in size with greater than 50% respiratory variability, suggesting right atrial pressure of 8 mmHg. IAS/Shunts: No atrial level shunt detected by color flow Doppler.  LEFT VENTRICLE PLAX 2D LVIDd:         4.30 cm LVIDs:         3.10 cm LV PW:         1.40 cm LV IVS:        1.10 cm LVOT diam:     2.30 cm LV SV:         74 LV SV Index:   33 LVOT Area:     4.15 cm  RIGHT VENTRICLE             IVC RV S prime:     17.30 cm/s  IVC diam: 2.20 cm TAPSE (M-mode): 2.6 cm LEFT ATRIUM             Index       RIGHT ATRIUM           Index LA diam:        3.50 cm 1.56 cm/m  RA Area:     20.10 cm LA Vol (A2C):   56.9 ml 25.34 ml/m RA Volume:   62.20 ml  27.70 ml/m LA Vol (A4C):  33.7 ml 15.01 ml/m LA Biplane Vol: 43.4 ml 19.33 ml/m  AORTIC VALVE LVOT Vmax:   128.00 cm/s LVOT Vmean:  74.900 cm/s LVOT VTI:    0.178 m  AORTA Ao Root diam: 3.70 cm MITRAL VALVE MV Area (PHT): 4.46 cm     SHUNTS MV Decel Time: 170 msec     Systemic VTI:  0.18 m MV E velocity: 104.50 cm/s  Systemic Diam: 2.30 cm Rachelle HoraMihai Croitoru MD Electronically signed by Thurmon FairMihai Croitoru MD Signature Date/Time: 05/01/2020/4:09:03 PM    Final     Scheduled Meds: . apixaban  5 mg Oral BID  . azithromycin  500 mg Oral Daily  . bictegravir-emtricitabine-tenofovir AF  1 tablet Oral Daily  . budesonide  (PULMICORT) nebulizer solution  0.5 mg Nebulization BID  . DULoxetine  30 mg Oral Daily  . feeding supplement (GLUCERNA SHAKE)  237 mL Oral BID BM  . furosemide  40 mg Intravenous Q12H  . insulin aspart  0-15 Units Subcutaneous TID WC  . insulin aspart  0-5 Units Subcutaneous QHS  . insulin detemir  14 Units Subcutaneous BID  . ipratropium-albuterol  3 mL Nebulization TID  . predniSONE  40 mg Oral Q breakfast    Continuous Infusions: . cefTRIAXone (ROCEPHIN)  IV 1 g (05/02/20 16100822)  . diltiazem (CARDIZEM) infusion 10 mg/hr (05/02/20 0400)     LOS: 2 days     Hollice EspySendil K Tashea Othman, MD [facility]   05/02/2020, 10:19 AM

## 2020-05-02 NOTE — Plan of Care (Signed)

## 2020-05-02 NOTE — Progress Notes (Signed)
Patient refused use of CPAP for the evening 

## 2020-05-02 NOTE — Progress Notes (Signed)
Patient refused CPAP.

## 2020-05-02 NOTE — Progress Notes (Signed)
Pt's family brought in outside from restaurant x 2 today. Even after the Pt's CBG was 501 and education provided to Pt, and he was talking to family members today on the phone about his sugars being so high.  Pt admitted to eating all the food that was brought in this evening.

## 2020-05-03 LAB — BASIC METABOLIC PANEL
Anion gap: 8 (ref 5–15)
BUN: 32 mg/dL — ABNORMAL HIGH (ref 6–20)
CO2: 35 mmol/L — ABNORMAL HIGH (ref 22–32)
Calcium: 9.4 mg/dL (ref 8.9–10.3)
Chloride: 96 mmol/L — ABNORMAL LOW (ref 98–111)
Creatinine, Ser: 1.49 mg/dL — ABNORMAL HIGH (ref 0.61–1.24)
GFR calc Af Amer: >60 mL/min (ref >60)
GFR calc non Af Amer: 52 mL/min — ABNORMAL LOW (ref >60)
Glucose, Bld: 188 mg/dL — ABNORMAL HIGH (ref 70–99)
Potassium: 4.5 mmol/L (ref 3.5–5.1)
Sodium: 139 mmol/L (ref 135–145)

## 2020-05-03 LAB — CBC
HCT: 48.5 % (ref 39.0–52.0)
Hemoglobin: 14.4 g/dL (ref 13.0–17.0)
MCH: 28.4 pg (ref 26.0–34.0)
MCHC: 29.7 g/dL — ABNORMAL LOW (ref 30.0–36.0)
MCV: 95.7 fL (ref 80.0–100.0)
Platelets: 214 10*3/uL (ref 150–400)
RBC: 5.07 MIL/uL (ref 4.22–5.81)
RDW: 11.9 % (ref 11.5–15.5)
WBC: 12.2 10*3/uL — ABNORMAL HIGH (ref 4.0–10.5)
nRBC: 0 % (ref 0.0–0.2)

## 2020-05-03 LAB — GLUCOSE, CAPILLARY
Glucose-Capillary: 183 mg/dL — ABNORMAL HIGH (ref 70–99)
Glucose-Capillary: 277 mg/dL — ABNORMAL HIGH (ref 70–99)
Glucose-Capillary: 233 mg/dL — ABNORMAL HIGH (ref 70–99)
Glucose-Capillary: 557 mg/dL (ref 70–99)
Glucose-Capillary: 243 mg/dL — ABNORMAL HIGH (ref 70–99)

## 2020-05-03 MED ORDER — PANTOPRAZOLE SODIUM 40 MG PO TBEC
40.0000 mg | DELAYED_RELEASE_TABLET | Freq: Every day | ORAL | Status: DC
  Administered 2020-05-03 – 2020-05-04 (×2): 40 mg via ORAL
  Filled 2020-05-03 (×2): qty 1

## 2020-05-03 MED ORDER — FUROSEMIDE 40 MG PO TABS
40.0000 mg | ORAL_TABLET | Freq: Two times a day (BID) | ORAL | Status: DC
  Administered 2020-05-04: 40 mg via ORAL
  Filled 2020-05-03: qty 1

## 2020-05-03 MED ORDER — INSULIN ASPART 100 UNIT/ML ~~LOC~~ SOLN
0.0000 [IU] | Freq: Three times a day (TID) | SUBCUTANEOUS | Status: DC
  Administered 2020-05-03: 7 [IU] via SUBCUTANEOUS
  Administered 2020-05-04: 4 [IU] via SUBCUTANEOUS

## 2020-05-03 MED ORDER — IPRATROPIUM-ALBUTEROL 0.5-2.5 (3) MG/3ML IN SOLN
3.0000 mL | Freq: Two times a day (BID) | RESPIRATORY_TRACT | Status: DC
  Administered 2020-05-03 – 2020-05-04 (×2): 3 mL via RESPIRATORY_TRACT
  Filled 2020-05-03 (×2): qty 3

## 2020-05-03 MED ORDER — FUROSEMIDE 10 MG/ML IJ SOLN
40.0000 mg | Freq: Once | INTRAMUSCULAR | Status: AC
  Administered 2020-05-03: 40 mg via INTRAVENOUS
  Filled 2020-05-03: qty 4

## 2020-05-03 MED ORDER — DILTIAZEM HCL ER COATED BEADS 180 MG PO CP24
300.0000 mg | ORAL_CAPSULE | Freq: Every day | ORAL | Status: DC
  Administered 2020-05-04: 300 mg via ORAL
  Filled 2020-05-03: qty 1

## 2020-05-03 MED ORDER — INSULIN ASPART 100 UNIT/ML ~~LOC~~ SOLN
0.0000 [IU] | Freq: Every day | SUBCUTANEOUS | Status: DC
  Administered 2020-05-03: 2 [IU] via SUBCUTANEOUS

## 2020-05-03 MED ORDER — FUROSEMIDE 40 MG PO TABS: 40.0000 mg | ORAL_TABLET | Freq: Two times a day (BID) | ORAL | Status: DC

## 2020-05-03 MED ORDER — INSULIN ASPART 100 UNIT/ML ~~LOC~~ SOLN
25.0000 [IU] | Freq: Once | SUBCUTANEOUS | Status: AC
  Administered 2020-05-03: 25 [IU] via SUBCUTANEOUS

## 2020-05-03 NOTE — Progress Notes (Signed)
Pt. Claimed that  he wants to sign out against med. advice, md made aware. But when   Rn about to give the paper to sign he changed his mind and wanted to go home tom. Wife at bedside. MD made aware.

## 2020-05-03 NOTE — Progress Notes (Signed)
PROGRESS NOTE  Chris [name]. YTK:[id number] DOB: 1966-04-07 DOA: 04/30/2020 PCP: Claiborne Rigg, NP  HPI/Recap of past 63 hours: 54 year old male with past medical history of HIV, morbid obesity, COPD with chronic respiratory failure on 2 L nasal cannula, I & D paroxysmal atrial fibrillation who presented to the emergency room on 9/20 with 3 days of increasing shortness of breath and productive cough.  Ruled out for Covid and patient has been fully vaccinated, patient found to be hypoxic initially requiring 4 L and eventually bumped up to 10 L high flow.  Also found to be in rapid atrial fibrillation.  Patient was admitted to the hospital service for COPD exacerbation and atrial fibrillation with RVR.  Placed in stepdown unit on Cardizem drip plus nebulizers, steroids, supplemental oxygen.  Felt to also be in diastolic heart failure.  Started on IV Lasix & has diuresed. Breathing a little easier today and down to 8 Lit.  Some productive cough with yellowish sputum.  Assessment/Plan:    Acute on chronic respiratory failure with hypoxia (HCC) secondary to COPD exacerbation, acute diastolic heart failure and suspected underlying sleep apnea:  -Patient's x-ray notes cardiomegaly although no florid signs of edema.  Although with diastolic heart failure and obesity, BNP can be normal.   -IV Lasix & he has responded with significant diuresis - Refused CPAP:  He will need a referral for an outpatient sleep study.  - Echocardiogram noted preserved ejection fraction, but did not comment on diastolic function.   -Continue Lasix IV today and then change to PO -nebs    HIV disease (HCC): Followed regularly at HIV clinic.  Continue antiretrovirals    Cigarette smoker: Counseled to quit.  Still smokes at least 2 cigarettes a day.    Essential hypertension, benign: Continue home medications.  Blood pressure overall stable, improved as he is diuresing.  Hyperlipidemia:LDL at 163.  Will start low dose  statin.    Morbid obesity due to excess calories (HCC) complicated by dm/hyperlipidemia: Meets criteria BMI greater than 35+ comorbidities of diabetes, hyperlipidemia and hypertension     GERD (gastroesophageal reflux disease): Continue PPI.    Diabetes mellitus type 2, uncontrolled in obese, on insulin (HCC): A1c at 8.3.  Have been titrating up insulin.  Apparently his wife has been bringing him in outside food.  Also on oral steroids    Atrial fibrillation with RVR (HCC):  - restarted home cardizem and increased to a higher dose.  - Continue anticoagulation.  Code Status: Full code   Disposition Plan: Home in the AM   Consultants:  None  Procedures:  Echocardiogram done 9/21: Preserved ejection fraction.  No comment on diastolic dysfunction  Antimicrobials:  IV Rocephin and Zithromax 9/20-present  DVT prophylaxis: Eliquis   Objective: Vitals:   05/03/20 0709 05/03/20 0858  BP: (!) 154/92   Pulse: 98   Resp: (!) 25   Temp: 98 F (36.7 C)   SpO2: 91% 98%    Intake/Output Summary (Last 24 hours) at 05/03/2020 1322 Last data filed at 05/03/2020 0309 Gross per 24 hour  Intake 689.43 ml  Output 3350 ml  Net -2660.57 ml   Filed Weights   05/01/20 0300 05/02/20 0500 05/03/20 0259  Weight: 110.4 kg 110.6 kg 110.1 kg   Body mass index is 35.84 kg/m.  Exam:   General: Appearance:    Obese male in no acute distress     Lungs:     Wheezing expiratory, respirations unlabored  Heart:  Normal heart rate. Irregular. No murmurs, rubs, or gallops.   MS:   All extremities are intact.   Neurologic:   Awake, alert, oriented x 3.     Data Reviewed: CBC: Recent Labs  Lab 04/30/20 0737 05/01/20 0126 05/03/20 0145  WBC 9.1 9.1 12.2*  HGB 14.6 14.7 14.4  HCT 49.5 48.5 48.5  MCV 96.7 96.0 95.7  PLT 233 222 214   Basic Metabolic Panel: Recent Labs  Lab 04/30/20 0737 04/30/20 1116 05/01/20 0126 05/03/20 0145  NA 136  --  135 139  K 4.1  --  4.9 4.5  CL  100  --  99 96*  CO2 29  --  29 35*  GLUCOSE 208*  --  281* 188*  BUN 21*  --  21* 32*  CREATININE 1.44*  --  1.30* 1.49*  CALCIUM 9.7  --  9.3 9.4  MG  --  2.1  --   --    GFR: Estimated Creatinine Clearance: 69.3 mL/min (A) (by C-G formula based on SCr of 1.49 mg/dL (H)). Liver Function Tests: No results for input(s): AST, ALT, ALKPHOS, BILITOT, PROT, ALBUMIN in the last 168 hours. No results for input(s): LIPASE, AMYLASE in the last 168 hours. No results for input(s): AMMONIA in the last 168 hours. Coagulation Profile: No results for input(s): INR, PROTIME in the last 168 hours. Cardiac Enzymes: No results for input(s): CKTOTAL, CKMB, CKMBINDEX, TROPONINI in the last 168 hours. BNP (last 3 results) No results for input(s): PROBNP in the last 8760 hours. HbA1C: Recent Labs    04/30/20 1553  HGBA1C 8.4*   CBG: Recent Labs  Lab 05/02/20 1945 05/02/20 2314 05/03/20 0302 05/03/20 0829 05/03/20 1135  GLUCAP 329* 240* 183* 277* 233*   Lipid Profile: Recent Labs    05/01/20 0126  CHOL 247*  HDL 38*  LDLCALC 193*  TRIG 80  CHOLHDL 6.5   Thyroid Function Tests: Recent Labs    04/30/20 1553  TSH 1.408   Anemia Panel: No results for input(s): VITAMINB12, FOLATE, FERRITIN, TIBC, IRON, RETICCTPCT in the last 72 hours. Urine analysis:    Component Value Date/Time   COLORURINE YELLOW 01/21/2018 1830   APPEARANCEUR CLEAR 01/21/2018 1830   LABSPEC 1.013 01/21/2018 1830   PHURINE 6.0 01/21/2018 1830   GLUCOSEU 50 (A) 01/21/2018 1830   HGBUR NEGATIVE 01/21/2018 1830   BILIRUBINUR negative 11/24/2018 1013   BILIRUBINUR neg 11/12/2016 0946   KETONESUR negative 11/24/2018 1013   KETONESUR NEGATIVE 01/21/2018 1830   PROTEINUR NEGATIVE 01/21/2018 1830   UROBILINOGEN 1.0 11/24/2018 1013   UROBILINOGEN 1 09/21/2014 1205   NITRITE Negative 11/24/2018 1013   NITRITE NEGATIVE 01/21/2018 1830   LEUKOCYTESUR Negative 11/24/2018 1013     Recent Results (from the past 240  hour(s))  SARS Coronavirus 2 by RT PCR (hospital order, performed in [facility] hospital lab) Nasopharyngeal Nasopharyngeal Swab     Status: None   Collection Time: 04/30/20 12:04 PM   Specimen: Nasopharyngeal Swab  Result Value Ref Range Status   SARS Coronavirus 2 NEGATIVE NEGATIVE Final    Comment: (NOTE) SARS-CoV-2 target nucleic acids are NOT DETECTED.  The SARS-CoV-2 RNA is generally detectable in upper and lower respiratory specimens during the acute phase of infection. The lowest concentration of SARS-CoV-2 viral copies this assay can detect is 250 copies / mL. A negative result does not preclude SARS-CoV-2 infection and should not be used as the sole basis for treatment or other patient management decisions.  A negative  result may occur with improper specimen collection / handling, submission of specimen other than nasopharyngeal swab, presence of viral mutation(s) within the areas targeted by this assay, and inadequate number of viral copies (<250 copies / mL). A negative result must be combined with clinical observations, patient history, and epidemiological information.  Fact Sheet for Patients:   BoilerBrush.com.cy  Fact Sheet for Healthcare Providers: https://pope.com/  This test is not yet approved or  cleared by the [place] FDA and has been authorized for detection and/or diagnosis of SARS-CoV-2 by FDA under an Emergency Use Authorization (EUA).  This EUA will remain in effect (meaning this test can be used) for the duration of the COVID-19 declaration under Section 564(b)(1) of the Act, 21 U.S.C. section 360bbb-3(b)(1), unless the authorization is terminated or revoked sooner.  Performed at [facility], [address], [place], [place] [address]   MRSA PCR Screening     Status: None   Collection Time: 04/30/20  8:37 PM   Specimen: Nasal Mucosa; Nasopharyngeal  Result Value Ref Range Status   MRSA  by PCR NEGATIVE NEGATIVE Final    Comment:        The GeneXpert MRSA Assay (FDA approved for NASAL specimens only), is one component of a comprehensive MRSA colonization surveillance program. It is not intended to diagnose MRSA infection nor to guide or monitor treatment for MRSA infections. Performed at [facility], [address], [place], [place] [address]       Studies: No results found.  Scheduled Meds: . apixaban  5 mg Oral BID  . atorvastatin  10 mg Oral Daily  . azithromycin  500 mg Oral Daily  . bictegravir-emtricitabine-tenofovir AF  1 tablet Oral Daily  . budesonide (PULMICORT) nebulizer solution  0.5 mg Nebulization BID  . [START ON 05/04/2020] diltiazem  300 mg Oral Daily  . DULoxetine  30 mg Oral Daily  . feeding supplement (GLUCERNA SHAKE)  237 mL Oral BID BM  . furosemide  40 mg Intravenous Once  . [START ON 05/04/2020] furosemide  40 mg Oral BID  . insulin aspart  0-20 Units Subcutaneous TID WC  . insulin aspart  0-5 Units Subcutaneous QHS  . insulin aspart  5 Units Subcutaneous TID WC  . insulin detemir  20 Units Subcutaneous BID  . ipratropium-albuterol  3 mL Nebulization TID  . pantoprazole  40 mg Oral Daily  . predniSONE  40 mg Oral Q breakfast    Continuous Infusions: . cefTRIAXone (ROCEPHIN)  IV 1 g (05/03/20 0910)     LOS: 3 days     Joseph Art, DO [facility]   05/03/2020, 1:22 PM

## 2020-05-03 NOTE — Progress Notes (Signed)
Blood sugar 557 mg/dl, MD aware with order of 25 units Novolog. Wife had brought food from outside. Instructed  to avoid food from outside since he is on carb. Mod. Diet  to control his blood sugar. Pt denied  Claimed that he only ate baked chicken.

## 2020-05-03 NOTE — Progress Notes (Signed)
Ambulated along the hallway for 15 min, with slight sob on exertion.o2 4L [place] in used.

## 2020-05-03 NOTE — TOC Progression Note (Signed)
Transition of Care (TOC) - Progression Note    Patient Details  Name: Chris [name]. MRN: [id number] Date of Birth: 05-31-1966  [facility]) CM/SW Contact  Leone Haven, RN Phone Number: 05/03/2020, 4:22 PM  Clinical Narrative:    NCM spoke with patient he states he has a concentrator at home and 7 tanks.  He states the 7 tanks are empty and his wife will go in the am to get them filled at [facility]. He states he will have transport home.  NCM contacted Zach with [employer] to see if he could bring oxygen tank to patient 's room to go home with.          Expected Discharge Plan and Services                                                 Social Determinants of Health (SDOH) Interventions    Readmission Risk Interventions No flowsheet data found.

## 2020-05-03 NOTE — Discharge Instructions (Addendum)
Wean O2 at home to keep sats 88-90%  Information on my medicine - ELIQUIS (apixaban)  This medication education was reviewed with me or my healthcare representative as part of my discharge preparation.  Why was Eliquis prescribed for you? Eliquis was prescribed for you to reduce the risk of a blood clot forming that can cause a stroke if you have a medical condition called atrial fibrillation (a type of irregular heartbeat).  What do You need to know about Eliquis ? Take your Eliquis TWICE DAILY - one tablet in the morning and one tablet in the evening with or without food. If you have difficulty swallowing the tablet whole please discuss with your pharmacist how to take the medication safely.  Take Eliquis exactly as prescribed by your doctor and DO NOT stop taking Eliquis without talking to the doctor who prescribed the medication.  Stopping may increase your risk of developing a stroke.  Refill your prescription before you run out.  After discharge, you should have regular check-up appointments with your healthcare provider that is prescribing your Eliquis.  In the future your dose may need to be changed if your kidney function or weight changes by a significant amount or as you get older.  What do you do if you miss a dose? If you miss a dose, take it as soon as you remember on the same day and resume taking twice daily.  Do not take more than one dose of ELIQUIS at the same time to make up a missed dose.  Important Safety Information A possible side effect of Eliquis is bleeding. You should call your healthcare provider right away if you experience any of the following: ? Bleeding from an injury or your nose that does not stop. ? Unusual colored urine (red or dark brown) or unusual colored stools (red or black). ? Unusual bruising for unknown reasons. ? A serious fall or if you hit your head (even if there is no bleeding).  Some medicines may interact with Eliquis and might  increase your risk of bleeding or clotting while on Eliquis. To help avoid this, consult your healthcare provider or pharmacist prior to using any new prescription or non-prescription medications, including herbals, vitamins, non-steroidal anti-inflammatory drugs (NSAIDs) and supplements.  This website has more information on Eliquis (apixaban): http://www.eliquis.com/eliquis/home

## 2020-05-03 NOTE — Care Management Important Message (Signed)
Important Message  Patient Details  Name: Chris [name]. MRN: [id number] Date of Birth: 11-21-1965   Medicare Important Message Given:        Dorena Bodo 05/03/2020, 3:04 PM

## 2020-05-03 NOTE — Progress Notes (Signed)
Patient refused use of CPAP for the evening 

## 2020-05-04 LAB — BASIC METABOLIC PANEL
Anion gap: 11 (ref 5–15)
BUN: 30 mg/dL — ABNORMAL HIGH (ref 6–20)
CO2: 33 mmol/L — ABNORMAL HIGH (ref 22–32)
Calcium: 9.4 mg/dL (ref 8.9–10.3)
Chloride: 95 mmol/L — ABNORMAL LOW (ref 98–111)
Creatinine, Ser: 1.27 mg/dL — ABNORMAL HIGH (ref 0.61–1.24)
GFR calc Af Amer: >60 mL/min (ref >60)
GFR calc non Af Amer: >60 mL/min (ref >60)
Glucose, Bld: 201 mg/dL — ABNORMAL HIGH (ref 70–99)
Potassium: 4 mmol/L (ref 3.5–5.1)
Sodium: 139 mmol/L (ref 135–145)

## 2020-05-04 LAB — GLUCOSE, CAPILLARY: Glucose-Capillary: 175 mg/dL — ABNORMAL HIGH (ref 70–99)

## 2020-05-04 MED ORDER — DILTIAZEM HCL ER COATED BEADS 240 MG PO CP24: 480.0000 mg | ORAL_CAPSULE | Freq: Every day | ORAL | 3 refills | Status: DC

## 2020-05-04 MED ORDER — FUROSEMIDE 40 MG PO TABS
40.0000 mg | ORAL_TABLET | Freq: Two times a day (BID) | ORAL | 0 refills | Status: DC
Start: 2020-05-04 — End: 2020-11-01

## 2020-05-04 MED ORDER — ATORVASTATIN CALCIUM 10 MG PO TABS
10.0000 mg | ORAL_TABLET | Freq: Every day | ORAL | 0 refills | Status: DC
Start: 2020-05-05 — End: 2020-05-04

## 2020-05-04 MED ORDER — ATORVASTATIN CALCIUM 10 MG PO TABS
10.0000 mg | ORAL_TABLET | Freq: Every day | ORAL | 0 refills | Status: DC
Start: 2020-05-05 — End: 2020-06-05

## 2020-05-04 MED ORDER — DILTIAZEM HCL ER COATED BEADS 240 MG PO CP24: 480.0000 mg | ORAL_CAPSULE | Freq: Every day | ORAL | 0 refills | Status: DC

## 2020-05-04 MED FILL — ATORVASTATIN CALCIUM 10 MG: 10 | 30 days supply | Qty: 30 | Fill #0

## 2020-05-04 MED FILL — FUROSEMIDE 40 MG TABLET: 40 | 30 days supply | Qty: 60 | Fill #0

## 2020-05-04 MED FILL — CARTIA XT 240 MG CAPSULE: 240 | 30 days supply | Qty: 60 | Fill #0

## 2020-05-04 NOTE — Discharge Summary (Signed)
Physician Discharge Summary  Chris [name] Chris [name]. DUK:[id number] DOB: [birth year] DOA: 04/30/2020  PCP: [name], NP  Admit date: 04/30/2020 Discharge date: 05/04/2020  Admitted From: Home Discharge disposition: Home   Recommendations for Outpatient Follow-Up:   1. BMP 1 week 2. Continue compliance with low-salt diet as well as low-carb diet suspect this will be ongoing issue as he did not follow a diabetic diet in the hospital 3. Will need referral for outpatient sleep study   Discharge Diagnosis:   Principal Problem:   Acute on chronic respiratory failure with hypoxia ([place]) Active Problems:   HIV disease ([place])   Cigarette smoker   Essential hypertension, benign   Morbid obesity due to excess calories ([place]) complicated by dm/hyperlipidemia   COPD with acute exacerbation (HCC)   GERD (gastroesophageal reflux disease)   Diabetes mellitus type 2 in obese (HCC)   Atrial fibrillation with RVR (HCC)   Acute and chronic respiratory failure with hypoxia    Diabetes mellitus type 2, insulin dependent ([place])    Discharge Condition: Improved.  Diet recommendation: Low sodium, heart healthy.  Carbohydrate-modified.    Wound care: None.  Code status: Full.   History of Present Illness:   Chris Emberton Wedeking Chris [name]. is a 54 y.o. male with medical history significant of paroxysmal A. fib on Eliquis, hypertension, hyperlipidemia, diabetes mellitus, HIV, depression/anxiety, GERD, chronic hypoxic respiratory failure secondary to COPD-on 2 L of oxygen at home, obesity presents to emergency department with worsening cough, shortness of breath since 3 days.  Patient tells me that he has productive cough with greenish sputum, associated with shortness of breath, wheezing, intermittent orthopnea and PND.  He uses 2 L of oxygen at home.  He denies leg swelling, weight gain, chest pain, palpitation, headache, blurry vision, fever, chills, nausea, vomiting, abdominal pain, urinary or bowel  changes.  He continues to smoke 2 cigarettes/day however denies alcohol, illicit drug use.  He reports being compliant with his home medications including Eliquis.  Reports that he is fully vaccinated against COVID-19.   Hospital Course by Problem:   Acute on chronic respiratory failure with hypoxia (HCC) secondary to COPD exacerbation, acute diastolic heart failure and suspected underlying sleep apnea:  -Patient's x-ray notes cardiomegaly although no florid signs of edema.  Although with diastolic heart failure and obesity, BNP can be normal.   -IV Lasix & he has responded with significant diuresis - Refused CPAP:  He will need a referral for an outpatient sleep study.  - Echocardiogram noted preserved ejection fraction, but did not comment on diastolic function.   -Has diuresed well with IV Lasix and will change to p.o. Lasix with outpatient titration -Follow-up closely with PMD in the next 1 week -nebs    HIV disease ([place]): Followed regularly at HIV clinic.  Continue antiretrovirals    Cigarette smoker: Counseled to quit.  Still smokes at least 2 cigarettes a day.    Essential hypertension, benign: Continue home medications.  Blood pressure overall stable, improved as he is diuresing.  Hyperlipidemia:LDL at 163.  Will start low dose statin.    Morbid obesity due to excess calories ([place]) complicated by dm/hyperlipidemia: Meets criteria BMI greater than 35+ comorbidities of diabetes, hyperlipidemia and hypertension Estimated body mass index is 35.65 kg/m as calculated from the following:   Height as of this encounter: '5\' 9"'  (1.753 m).   Weight as of this encounter: 109.5 kg.     GERD (gastroesophageal reflux disease): Continue PPI.  Diabetes mellitus type 2, uncontrolled in obese, on insulin ([place]): A1c at 8.3.  Have been titrating up insulin.  Apparently his wife has been bringing him in outside food.  -Thankfully has finished course of steroids -Encourage patient to  follow a diabetic diet closely    Atrial fibrillation with RVR ([place]):  - restarted home cardizem   - Continue anticoagulation.    Medical Consultants:      Discharge Exam:   Vitals:   05/04/20 0727 05/04/20 0747  BP:    Pulse:    Resp:    Temp: 98.7 F (37.1 C)   SpO2:  94%   Vitals:   05/04/20 0300 05/04/20 0356 05/04/20 0727 05/04/20 0747  BP:  (!) 144/98    Pulse:  (!) 110    Resp: 16 (!) 24    Temp:  97.6 F (36.4 C) 98.7 F (37.1 C)   TempSrc:  Oral    SpO2:  92%  94%  Weight:  109.5 kg    Height:        General exam: Appears calm and comfortable.  The results of significant diagnostics from this hospitalization (including imaging, microbiology, ancillary and laboratory) are listed below for reference.     Procedures and Diagnostic Studies:   DG Chest Portable 1 View  Result Date: 04/30/2020 CLINICAL DATA:  Chest pain and shortness of breath EXAM: PORTABLE CHEST 1 VIEW COMPARISON:  March 18, 2020 FINDINGS: There is scarring versus atelectatic change in the lung bases, stable from prior study. No new opacity evident. There is cardiomegaly with a degree of pulmonary venous hypertension. No adenopathy. No bone lesions. IMPRESSION: Cardiomegaly with pulmonary vascular congestion, stable. Scarring versus atelectasis in the lower lung regions, stable. No new opacity evident. Electronically Signed   By: Lowella Grip III M.D.   On: 04/30/2020 08:07   ECHOCARDIOGRAM COMPLETE  Result Date: 05/01/2020    ECHOCARDIOGRAM REPORT   Patient Name:   Chris Millon Villada Chris [name]. Date of Exam: 05/01/2020 Medical Rec #:  [id number]         Height:       69.0 in Accession #:    [id number]        Weight:       243.4 lb Date of Birth:  October 20, 1965          BSA:          2.246 m Patient Age:    54 years          BP:           164/82 mmHg Patient Gender: M                 HR:           96 bpm. Exam Location:  Inpatient Procedure: 2D Echo, Cardiac Doppler, Color Doppler and Intracardiac             Opacification Agent Indications:    I48.91* Unspeicified atrial fibrillation  History:        Patient has prior history of Echocardiogram examinations, most                 recent 10/05/2017. Abnormal ECG, COPD, Arrythmias:Atrial                 Fibrillation; Risk Factors:Hypertension, Dyslipidemia, Diabetes                 and Sleep Apnea. HIV. Respiratory failure.  Sonographer:    Roseanna Rainbow RDCS Referring Phys:  [id number] Michell Heinrich PAHWANI  Sonographer Comments: Technically challenging study due to limited acoustic windows, Technically difficult study due to poor echo windows, suboptimal parasternal window, suboptimal apical window, suboptimal subcostal window and patient is morbidly obese.  Image acquisition challenging due to patient body habitus. IMPRESSIONS  1. Left ventricular ejection fraction, by estimation, is 55 to 60%. The left ventricle has normal function. The left ventricle has no regional wall motion abnormalities. Left ventricular diastolic function could not be evaluated.  2. Right ventricular systolic function is normal. The right ventricular size is normal. Tricuspid regurgitation signal is inadequate for assessing PA pressure.  3. The mitral valve is normal in structure. No evidence of mitral valve regurgitation. No evidence of mitral stenosis.  4. The aortic valve is normal in structure. Aortic valve regurgitation is not visualized. No aortic stenosis is present.  5. The inferior vena cava is dilated in size with >50% respiratory variability, suggesting right atrial pressure of 8 mmHg. FINDINGS  Left Ventricle: Left ventricular ejection fraction, by estimation, is 55 to 60%. The left ventricle has normal function. The left ventricle has no regional wall motion abnormalities. Definity contrast agent was given IV to delineate the left ventricular  endocardial borders. The left ventricular internal cavity size was normal in size. There is no left ventricular hypertrophy. Left ventricular diastolic  function could not be evaluated due to atrial fibrillation. Left ventricular diastolic function could  not be evaluated. Right Ventricle: The right ventricular size is normal. No increase in right ventricular wall thickness. Right ventricular systolic function is normal. Tricuspid regurgitation signal is inadequate for assessing PA pressure. Left Atrium: Left atrial size was normal in size. Right Atrium: Right atrial size was normal in size. Pericardium: There is no evidence of pericardial effusion. Mitral Valve: The mitral valve is normal in structure. No evidence of mitral valve regurgitation. No evidence of mitral valve stenosis. Tricuspid Valve: The tricuspid valve is normal in structure. Tricuspid valve regurgitation is not demonstrated. No evidence of tricuspid stenosis. Aortic Valve: The aortic valve is normal in structure. Aortic valve regurgitation is not visualized. No aortic stenosis is present. Pulmonic Valve: The pulmonic valve was normal in structure. Pulmonic valve regurgitation is not visualized. No evidence of pulmonic stenosis. Aorta: The aortic root is normal in size and structure. Venous: The inferior vena cava is dilated in size with greater than 50% respiratory variability, suggesting right atrial pressure of 8 mmHg. IAS/Shunts: No atrial level shunt detected by color flow Doppler.  LEFT VENTRICLE PLAX 2D LVIDd:         4.30 cm LVIDs:         3.10 cm LV PW:         1.40 cm LV IVS:        1.10 cm LVOT diam:     2.30 cm LV SV:         74 LV SV Index:   33 LVOT Area:     4.15 cm  RIGHT VENTRICLE             IVC RV S prime:     17.30 cm/s  IVC diam: 2.20 cm TAPSE (M-mode): 2.6 cm LEFT ATRIUM             Index       RIGHT ATRIUM           Index LA diam:        3.50 cm 1.56 cm/m  RA Area:     20.10 cm LA  Vol [facility]):   56.9 ml 25.34 ml/m RA Volume:   62.20 ml  27.70 ml/m LA Vol (A4C):   33.7 ml 15.01 ml/m LA Biplane Vol: 43.4 ml 19.33 ml/m  AORTIC VALVE LVOT Vmax:   128.00 cm/s LVOT Vmean:   74.900 cm/s LVOT VTI:    0.178 m  AORTA Ao Root diam: 3.70 cm MITRAL VALVE MV Area (PHT): 4.46 cm     SHUNTS MV Decel Time: 170 msec     Systemic VTI:  0.18 m MV E velocity: 104.50 cm/s  Systemic Diam: 2.30 cm Mihai Croitoru MD Electronically signed by Sanda Klein MD Signature Date/Time: 05/01/2020/4:09:03 PM    Final      Labs:   Basic Metabolic Panel: Recent Labs  Lab 04/30/20 0737 04/30/20 0737 04/30/20 1116 05/01/20 0126 05/01/20 0126 05/03/20 0145 05/04/20 0737  NA 136  --   --  135  --  139 139  K 4.1   < >  --  4.9   < > 4.5 4.0  CL 100  --   --  99  --  96* 95*  CO2 29  --   --  29  --  35* 33*  GLUCOSE 208*  --   --  281*  --  188* 201*  BUN 21*  --   --  21*  --  32* 30*  CREATININE 1.44*  --   --  1.30*  --  1.49* 1.27*  CALCIUM 9.7  --   --  9.3  --  9.4 9.4  MG  --   --  2.1  --   --   --   --    < > = values in this interval not displayed.   GFR Estimated Creatinine Clearance: 81.1 mL/min (A) (by C-G formula based on SCr of 1.27 mg/dL (H)). Liver Function Tests: No results for input(s): AST, ALT, ALKPHOS, BILITOT, PROT, ALBUMIN in the last 168 hours. No results for input(s): LIPASE, AMYLASE in the last 168 hours. No results for input(s): AMMONIA in the last 168 hours. Coagulation profile No results for input(s): INR, PROTIME in the last 168 hours.  CBC: Recent Labs  Lab 04/30/20 0737 05/01/20 0126 05/03/20 0145  WBC 9.1 9.1 12.2*  HGB 14.6 14.7 14.4  HCT 49.5 48.5 48.5  MCV 96.7 96.0 95.7  PLT 233 222 214   Cardiac Enzymes: No results for input(s): CKTOTAL, CKMB, CKMBINDEX, TROPONINI in the last 168 hours. BNP: Invalid input(s): POCBNP CBG: Recent Labs  Lab 05/03/20 0829 05/03/20 1135 05/03/20 1646 05/03/20 2044 05/04/20 0629  GLUCAP 277* 233* 557* 243* 175*   D-Dimer No results for input(s): DDIMER in the last 72 hours. Hgb A1c No results for input(s): HGBA1C in the last 72 hours. Lipid Profile No results for input(s): CHOL, HDL,  LDLCALC, TRIG, CHOLHDL, LDLDIRECT in the last 72 hours. Thyroid function studies No results for input(s): TSH, T4TOTAL, T3FREE, THYROIDAB in the last 72 hours.  Invalid input(s): FREET3 Anemia work up No results for input(s): VITAMINB12, FOLATE, FERRITIN, TIBC, IRON, RETICCTPCT in the last 72 hours. Microbiology Recent Results (from the past 240 hour(s))  SARS Coronavirus 2 by RT PCR (hospital order, performed in [facility] hospital lab) Nasopharyngeal Nasopharyngeal Swab     Status: None   Collection Time: 04/30/20 12:04 PM   Specimen: Nasopharyngeal Swab  Result Value Ref Range Status   SARS Coronavirus 2 NEGATIVE NEGATIVE Final    Comment: (NOTE) SARS-CoV-2 target nucleic acids are NOT DETECTED.  The SARS-CoV-2 RNA is generally detectable in upper and lower respiratory specimens during the acute phase of infection. The lowest concentration of SARS-CoV-2 viral copies this assay can detect is 250 copies / mL. A negative result does not preclude SARS-CoV-2 infection and should not be used as the sole basis for treatment or other patient management decisions.  A negative result may occur with improper specimen collection / handling, submission of specimen other than nasopharyngeal swab, presence of viral mutation(s) within the areas targeted by this assay, and inadequate number of viral copies (<250 copies / mL). A negative result must be combined with clinical observations, patient history, and epidemiological information.  Fact Sheet for Patients:   StrictlyIdeas.no  Fact Sheet for Healthcare Providers: BankingDealers.co.za  This test is not yet approved or  cleared by the [place] FDA and has been authorized for detection and/or diagnosis of SARS-CoV-2 by FDA under an Emergency Use Authorization (EUA).  This EUA will remain in effect (meaning this test can be used) for the duration of the COVID-19 declaration under Section  564(b)(1) of the Act, 21 U.S.C. section 360bbb-3(b)(1), unless the authorization is terminated or revoked sooner.  Performed at [facility], [place] [address], [place], [place] [address]   MRSA PCR Screening     Status: None   Collection Time: 04/30/20  8:37 PM   Specimen: Nasal Mucosa; Nasopharyngeal  Result Value Ref Range Status   MRSA by PCR NEGATIVE NEGATIVE Final    Comment:        The GeneXpert MRSA Assay (FDA approved for NASAL specimens only), is one component of a comprehensive MRSA colonization surveillance program. It is not intended to diagnose MRSA infection nor to guide or monitor treatment for MRSA infections. Performed at [facility], [place] [address], [place], [place] [address]      Discharge Instructions:   Discharge Instructions    (HEART FAILURE PATIENTS) Call MD:  Anytime you have any of the following symptoms: 1) 3 pound weight gain in 24 hours or 5 pounds in 1 week 2) shortness of breath, with or without a dry hacking cough 3) swelling in the hands, feet or stomach 4) if you have to sleep on extra pillows at night in order to breathe.   Complete by: As directed    Diet - low sodium heart healthy   Complete by: As directed    Diet Carb Modified   Complete by: As directed    Discharge instructions   Complete by: As directed    BMP 1 week   Increase activity slowly   Complete by: As directed      Allergies as of 05/04/2020      Reactions   Bactrim [sulfamethoxazole-trimethoprim] Hives      Medication List    STOP taking these medications   losartan 25 MG tablet Commonly known as: COZAAR     TAKE these medications   acetaminophen-codeine 300-30 MG tablet Commonly known as: TYLENOL #3 Take 1-2 tablets by mouth every 12 (twelve) hours as needed for moderate pain.   albuterol 108 (90 Base) MCG/ACT inhaler Commonly known as: VENTOLIN HFA Inhale 2 puffs into the lungs every 6 (six) hours as needed for wheezing or shortness of  breath.   apixaban 5 MG Tabs tablet Commonly known as: Eliquis Take 1 tablet (5 mg total) by mouth 2 (two) times daily.   atorvastatin 10 MG tablet Commonly known as: LIPITOR Take 1 tablet (10 mg total) by mouth daily.  Start taking on: May 05, 2020   B-D UF III MINI PEN NEEDLES 31G X 5 MM Misc Generic drug: Insulin Pen Needle Use as instructed. Inject into the skin once daily E11.65   baclofen 10 MG tablet Commonly known as: LIORESAL Take 1 tablet (10 mg total) by mouth 2 (two) times daily. As needed for muscle spasm What changed: additional instructions   Bevespi Aerosphere 9-4.8 MCG/ACT Aero Generic drug: Glycopyrrolate-Formoterol Inhale 2 puffs into the lungs in the morning and at bedtime.   Biktarvy 50-200-25 MG Tabs tablet Generic drug: bictegravir-emtricitabine-tenofovir AF Take 1 tablet by mouth daily.   diltiazem 240 MG 24 hr capsule Commonly known as: CARDIZEM CD Take 2 capsules (480 mg total) by mouth daily. What changed: how much to take   DULoxetine 30 MG capsule Commonly known as: CYMBALTA TAKE 1 CAPSULE BY MOUTH EVERY DAY What changed: how much to take   furosemide 40 MG tablet Commonly known as: LASIX Take 1 tablet (40 mg total) by mouth 2 (two) times daily.   metFORMIN 1000 MG tablet Commonly known as: GLUCOPHAGE Take 1 tablet (1,000 mg total) by mouth daily with breakfast.   omeprazole 40 MG capsule Commonly known as: PRILOSEC Take 40 mg by mouth daily before breakfast.   OneTouch Delica Lancets 72Q Misc Use as instructed. Check blood glucose level by fingerstick twice per day. E11.65   OneTouch Verio test strip Generic drug: glucose blood Use as instructed. Check blood glucose level by fingerstick twice per day. E11.65   OneTouch Verio w/Device Kit 1 kit by Does not apply route 2 (two) times daily.   OXYGEN Inhale 2 L/min into the lungs as needed (shortness of breath).   sildenafil 100 MG tablet Commonly known as: Viagra Take 1  tablet (100 mg total) by mouth daily as needed for erectile dysfunction.   Victoza 18 MG/3ML Sopn Generic drug: liraglutide Inject 0.3 mLs (1.8 mg total) into the skin daily.   Vitamin D (Ergocalciferol) 1.25 MG (50000 UNIT) Caps capsule Commonly known as: DRISDOL Take 1 capsule (50,000 Units total) by mouth every 7 (seven) days.       Follow-up Information    [name], NP Follow up in 1 week(s).   Specialty: Nurse Practitioner Why: BMP Contact information: [place] [address] [phone]        Carlyle Basques, MD .   Specialty: Infectious Diseases Contact information: [place] [address] [phone]        Leonie Man, MD .   Specialty: Cardiology Contact information: [address] [place] [address] [phone]                Time coordinating discharge: 35 min  Signed:  Geradine Girt DO  [facility] 05/04/2020, 9:36 AM

## 2020-05-04 NOTE — Progress Notes (Signed)
All set  for discharge, discharge instructions given to pt.  meds delivered from pharmacy. Awaiting delivery of oxygen tank.

## 2020-05-04 NOTE — TOC Transition Note (Signed)
Transition of Care [facility]) - CM/SW Discharge Note   Patient Details  Name: Chris [name]. MRN: [id number] Date of Birth: 16-Sep-1965  [facility] CM/SW Contact:  Leone Haven, RN Phone Number: 05/04/2020, 9:10 AM   Clinical Narrative:    Patient for possible dc today, NCM spoke with Ian Malkin with [employer] and he states a oxygen tank was delivered to patient yesterday evening for him to go home with.  NCM spoke with patient he states yes the tank is in the room and his wife will also get the other tanks filled this am .  He has transport home and no issues with getting meds.   Final next level of care: Home/Self Care Barriers to Discharge: No Barriers Identified   Patient Goals and CMS Choice Patient states their goals for this hospitalization and ongoing recovery are:: get better   Choice offered to / list presented to : NA  Discharge Placement                       Discharge Plan and Services                  DME Agency: NA                  Social Determinants of Health (SDOH) Interventions     Readmission Risk Interventions No flowsheet data found.

## 2020-05-07 ENCOUNTER — Other Ambulatory Visit: Payer: Self-pay | Admitting: Nurse Practitioner

## 2020-05-07 NOTE — Telephone Encounter (Signed)
Requested medication (s) are due for refill today: yes  Requested medication (s) are on the active medication list: yes  Last refill: 05/07/20  Future visit scheduled: yes  Notes to clinic:  not delegated    Requested Prescriptions  Pending Prescriptions Disp Refills   acetaminophen-codeine (TYLENOL #3) 300-30 MG tablet 60 tablet 0    Sig: Take 1-2 tablets by mouth every 12 (twelve) hours as needed for moderate pain.      Not Delegated - Analgesics:  Opioid Agonist Combinations Failed - 05/07/2020 10:31 AM      Failed - This refill cannot be delegated      Failed - Urine Drug Screen completed in last 360 days.      Passed - Valid encounter within last 6 months    Recent Outpatient Visits           2 weeks ago Type 2 diabetes mellitus with hyperglycemia, without long-term current use of insulin (HCC)   [facility] Fulp, [place], MD   3 months ago Type 2 diabetes mellitus with hyperglycemia, without long-term current use of insulin (HCC)   [place] [facility] [place], Shea Stakes, NP   6 months ago Encounter for annual physical exam   [facility] [place], [place] W, NP   7 months ago Type 2 diabetes mellitus with complication, without long-term current use of insulin [facility])   [place] [facility] [place], Shea Stakes, NP   9 months ago Pain, dental   [place] [facility] Hoy Register, MD       Future Appointments             In 4 weeks Claiborne Rigg, NP [facility]   In 2 months [place], Tama Headings, FNP [facility] for Infectious Disease, [facility]

## 2020-05-07 NOTE — Telephone Encounter (Signed)
Medication Refill - Medication: Tylenol 3  Has the patient contacted their pharmacy? Yes.   Pt states that he only has one pill left. Please advise.  (Agent: If no, request that the patient contact the pharmacy for the refill.) (Agent: If yes, when and what did the pharmacy advise?)  Preferred Pharmacy (with phone number or street name):  [id number] [place] [place] - [address] AT [facility] [address]  [address] [place] [address]  Phone: [phone] Fax: [phone]  Hours: Open 24 hours     Agent: Please be advised that RX refills may take up to 3 business days. We ask that you follow-up with your pharmacy.

## 2020-05-09 MED ORDER — ACETAMINOPHEN-CODEINE #3 300-30 MG PO TABS: 1.0000 | ORAL_TABLET | Freq: Two times a day (BID) | ORAL | 0 refills | Status: DC | PRN

## 2020-05-09 NOTE — Telephone Encounter (Signed)
Pt called in to follow up on request. Pt says that he is completely out of his medication and would like as soon as possible because he's in pain.   Please assist pt.

## 2020-05-11 ENCOUNTER — Other Ambulatory Visit: Payer: Self-pay | Admitting: Nurse Practitioner

## 2020-05-11 NOTE — Telephone Encounter (Signed)
Requested medication (s) are due for refill today: No  Requested medication (s) are on the active medication list: yes  Last refill: 05/09/20  #60  0 refills  Future visit scheduled: yes  Notes to clinic:  Not delegated    Requested Prescriptions  Pending Prescriptions Disp Refills   Acetaminophen-Codeine 300-30 MG tablet [Pharmacy Med Name: ACETAMINOPHEN-COD #3 TABLET] 60 tablet 0    Sig: TAKE 1-2 TABLETS BY MOUTH EVERY 12 (TWELVE) HOURS AS NEEDED FOR MODERATE PAIN.      Not Delegated - Analgesics:  Opioid Agonist Combinations Failed - 05/11/2020 10:38 AM      Failed - This refill cannot be delegated      Failed - Urine Drug Screen completed in last 360 days.      Passed - Valid encounter within last 6 months    Recent Outpatient Visits           3 weeks ago Type 2 diabetes mellitus with hyperglycemia, without long-term current use of insulin (HCC)   [place] [facility] Fulp, [place], MD   3 months ago Type 2 diabetes mellitus with hyperglycemia, without long-term current use of insulin (HCC)   [place] [facility] [place], Shea Stakes, NP   6 months ago Encounter for annual physical exam   [facility] [place], [place] W, NP   7 months ago Type 2 diabetes mellitus with complication, without long-term current use of insulin [facility])   [facility] [place], Shea Stakes, NP   9 months ago Pain, dental   [place] [facility] Hoy Register, MD       Future Appointments             In 3 weeks Claiborne Rigg, NP [facility]   In 2 months [place], Tama Headings, FNP [facility] for Infectious Disease, [facility]

## 2020-05-15 ENCOUNTER — Other Ambulatory Visit: Payer: Self-pay | Admitting: Nurse Practitioner

## 2020-05-15 NOTE — Telephone Encounter (Signed)
Please refill if indicated! 

## 2020-05-15 NOTE — Telephone Encounter (Signed)
Pt called and is requesting to have this sent in. He states that the pharmacy has not received it. Pt states that he is in a lot of pain and has not had medication in a week. Please advise.

## 2020-05-15 NOTE — Telephone Encounter (Signed)
Requested medication (s) are due for refill today: yes  Requested medication (s) are on the active medication list: yes  Last refill:  05/08/20  #60  0 refills  Future visit scheduled: yes 06/04/20  Notes to clinic:  Not delegated. Patient may need refill prior to appointment scheduled per Sig.    Requested Prescriptions  Pending Prescriptions Disp Refills   Acetaminophen-Codeine 300-30 MG tablet [Pharmacy Med Name: ACETAMINOPHEN-COD #3 TABLET] 60 tablet 0    Sig: TAKE 1-2 TABLETS BY MOUTH EVERY 12 (TWELVE) HOURS AS NEEDED FOR MODERATE PAIN.      Not Delegated - Analgesics:  Opioid Agonist Combinations Failed - 05/15/2020 12:12 PM      Failed - This refill cannot be delegated      Failed - Urine Drug Screen completed in last 360 days.      Passed - Valid encounter within last 6 months    Recent Outpatient Visits           3 weeks ago Type 2 diabetes mellitus with hyperglycemia, without long-term current use of insulin (HCC)   [place] [facility] Fulp, [place], MD   3 months ago Type 2 diabetes mellitus with hyperglycemia, without long-term current use of insulin (HCC)   [place] [facility] [place], Shea Stakes, NP   7 months ago Encounter for annual physical exam   [facility] [place], [place] W, NP   7 months ago Type 2 diabetes mellitus with complication, without long-term current use of insulin [facility])   [place] [facility] [place], Shea Stakes, NP   9 months ago Pain, dental   [place] [facility] Hoy Register, MD       Future Appointments             In 2 weeks Claiborne Rigg, NP [facility]   In 2 months [place], Tama Headings, FNP [facility] for Infectious Disease, [facility]

## 2020-05-16 DIAGNOSIS — J129 Viral pneumonia, unspecified: Secondary | ICD-10-CM | POA: Diagnosis not present

## 2020-05-16 DIAGNOSIS — J449 Chronic obstructive pulmonary disease, unspecified: Secondary | ICD-10-CM | POA: Diagnosis not present

## 2020-05-16 NOTE — Telephone Encounter (Signed)
Requested medication (s) are due for refill today: yes  Requested medication (s) are on the active medication list: yes  Last refill:  start: 05/09/20 end : 06/08/20 #60 0 refills  Future visit scheduled: yes in 2 weeks   Notes to clinic:  not delegated per protocol     Requested Prescriptions  Pending Prescriptions Disp Refills   Acetaminophen-Codeine 300-30 MG tablet [Pharmacy Med Name: ACETAMINOPHEN-COD #3 TABLET] 60 tablet 0    Sig: TAKE 1-2 TABLETS BY MOUTH EVERY 12 (TWELVE) HOURS AS NEEDED FOR MODERATE PAIN.      Not Delegated - Analgesics:  Opioid Agonist Combinations Failed - 05/16/2020  4:32 PM      Failed - This refill cannot be delegated      Failed - Urine Drug Screen completed in last 360 days.      Passed - Valid encounter within last 6 months    Recent Outpatient Visits           3 weeks ago Type 2 diabetes mellitus with hyperglycemia, without long-term current use of insulin (HCC)   [place] [facility] Fulp, [place], MD   3 months ago Type 2 diabetes mellitus with hyperglycemia, without long-term current use of insulin (HCC)   [place] [facility] [place], Shea Stakes, NP   7 months ago Encounter for annual physical exam   [facility] [place], [place] W, NP   7 months ago Type 2 diabetes mellitus with complication, without long-term current use of insulin [facility])   [place] [facility] [place], Shea Stakes, NP   9 months ago Pain, dental   [place] [facility] Hoy Register, MD       Future Appointments             In 2 weeks Claiborne Rigg, NP [facility]   In 2 months [place], Tama Headings, FNP [facility] for Infectious Disease, [facility]

## 2020-05-16 NOTE — Telephone Encounter (Signed)
acetaminophen-codeine (TYLENOL #3) 300-30 MG tablet Medication Date: 05/09/2020 Department: [facility] Ordering/Authorizing: Claiborne Rigg, NP   Please resend this script. [facility], it does state that verification could not be provided, maybe did not go thru as [facility] is stating [facility] not [facility]  [id number] [place], [place] - [address] AT [facility] [address] Phone:  [phone]  Fax:  [phone]

## 2020-05-16 NOTE — Telephone Encounter (Signed)
This was already filled on 05-09-2020. It is listed under medications tab

## 2020-05-17 ENCOUNTER — Other Ambulatory Visit: Payer: Self-pay | Admitting: Nurse Practitioner

## 2020-05-17 MED ORDER — ACETAMINOPHEN-CODEINE #3 300-30 MG PO TABS: 1.0000 | ORAL_TABLET | Freq: Two times a day (BID) | ORAL | 0 refills | Status: DC | PRN

## 2020-05-25 ENCOUNTER — Other Ambulatory Visit: Payer: Self-pay | Admitting: Nurse Practitioner

## 2020-06-04 ENCOUNTER — Ambulatory Visit: Payer: Medicare HMO | Attending: Nurse Practitioner | Admitting: Nurse Practitioner

## 2020-06-04 ENCOUNTER — Other Ambulatory Visit: Payer: Self-pay

## 2020-06-04 VITALS — BP 107/58 | HR 93 | Wt 235.4 lb

## 2020-06-04 DIAGNOSIS — J449 Chronic obstructive pulmonary disease, unspecified: Secondary | ICD-10-CM

## 2020-06-04 DIAGNOSIS — I10 Essential (primary) hypertension: Secondary | ICD-10-CM | POA: Diagnosis not present

## 2020-06-04 DIAGNOSIS — E785 Hyperlipidemia, unspecified: Secondary | ICD-10-CM

## 2020-06-04 DIAGNOSIS — J9601 Acute respiratory failure with hypoxia: Secondary | ICD-10-CM

## 2020-06-04 DIAGNOSIS — E1165 Type 2 diabetes mellitus with hyperglycemia: Secondary | ICD-10-CM | POA: Diagnosis not present

## 2020-06-04 DIAGNOSIS — G8929 Other chronic pain: Secondary | ICD-10-CM | POA: Diagnosis not present

## 2020-06-04 DIAGNOSIS — I480 Paroxysmal atrial fibrillation: Secondary | ICD-10-CM

## 2020-06-04 DIAGNOSIS — R69 Illness, unspecified: Secondary | ICD-10-CM | POA: Diagnosis not present

## 2020-06-04 DIAGNOSIS — E118 Type 2 diabetes mellitus with unspecified complications: Secondary | ICD-10-CM

## 2020-06-04 DIAGNOSIS — M5441 Lumbago with sciatica, right side: Secondary | ICD-10-CM | POA: Diagnosis not present

## 2020-06-04 DIAGNOSIS — R0981 Nasal congestion: Secondary | ICD-10-CM | POA: Diagnosis not present

## 2020-06-04 DIAGNOSIS — F4321 Adjustment disorder with depressed mood: Secondary | ICD-10-CM | POA: Diagnosis not present

## 2020-06-04 DIAGNOSIS — J3489 Other specified disorders of nose and nasal sinuses: Secondary | ICD-10-CM

## 2020-06-04 LAB — GLUCOSE, POCT (MANUAL RESULT ENTRY): POC Glucose: >500 mg/dl (ref 70–99)

## 2020-06-04 LAB — POCT URINALYSIS DIP (CLINITEK)
Bilirubin, UA: NEGATIVE
Blood, UA: NEGATIVE
Glucose, UA: >=1000 mg/dL — AB
Ketones, POC UA: NEGATIVE mg/dL
Leukocytes, UA: NEGATIVE
Nitrite, UA: NEGATIVE
POC PROTEIN,UA: 30 — AB
Spec Grav, UA: 1.01 (ref 1.010–1.025)
Urobilinogen, UA: 0.2 E.U./dL
pH, UA: 6 (ref 5.0–8.0)

## 2020-06-04 MED ORDER — DAPAGLIFLOZIN PROPANEDIOL 5 MG PO TABS: 5.0000 mg | ORAL_TABLET | Freq: Every day | ORAL | 1 refills | Status: AC

## 2020-06-04 MED ORDER — VICTOZA 18 MG/3ML ~~LOC~~ SOPN: 1.8000 mg | PEN_INJECTOR | Freq: Every day | SUBCUTANEOUS | 3 refills | Status: DC

## 2020-06-04 MED ORDER — ACETAMINOPHEN-CODEINE #3 300-30 MG PO TABS: 1.0000 | ORAL_TABLET | Freq: Two times a day (BID) | ORAL | 0 refills | Status: DC | PRN

## 2020-06-04 MED ORDER — FLUTICASONE PROPIONATE 50 MCG/ACT NA SUSP: 2.0000 | Freq: Every day | NASAL | 6 refills | Status: DC

## 2020-06-04 MED ORDER — DULOXETINE HCL 60 MG PO CPEP: 60.0000 mg | ORAL_CAPSULE | Freq: Every day | ORAL | 3 refills | Status: DC

## 2020-06-04 MED ORDER — METFORMIN HCL 1000 MG PO TABS: 1000.0000 mg | ORAL_TABLET | Freq: Two times a day (BID) | ORAL | 1 refills | Status: DC
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
## 2020-06-04 NOTE — Progress Notes (Signed)
Has chest congestion, runny nose.  Increase in neuropathy medication.  Hands are shaking.

## 2020-06-05 ENCOUNTER — Other Ambulatory Visit: Payer: Self-pay | Admitting: Nurse Practitioner

## 2020-06-05 LAB — MICROALBUMIN / CREATININE URINE RATIO
Creatinine, Urine: 35.5 mg/dL
Microalb/Creat Ratio: 236 mg/g creat — ABNORMAL HIGH (ref 0–29)
Microalbumin, Urine: 83.9 ug/mL

## 2020-06-05 MED ORDER — DILTIAZEM HCL ER COATED BEADS 240 MG PO CP24: 480.0000 mg | ORAL_CAPSULE | Freq: Every day | ORAL | 1 refills | Status: DC

## 2020-06-05 MED ORDER — ATORVASTATIN CALCIUM 40 MG PO TABS: 40.0000 mg | ORAL_TABLET | Freq: Every day | ORAL | 2 refills | Status: DC

## 2020-06-05 MED ORDER — MISC. DEVICES MISC: 0 refills | Status: AC

## 2020-06-05 MED ORDER — DILTIAZEM HCL ER COATED BEADS 240 MG PO CP24: 240.0000 mg | ORAL_CAPSULE | Freq: Every day | ORAL | 1 refills | Status: DC

## 2020-06-05 MED ORDER — LOSARTAN POTASSIUM 25 MG PO TABS: 12.5000 mg | ORAL_TABLET | Freq: Every day | ORAL | 1 refills | Status: DC

## 2020-06-11 ENCOUNTER — Ambulatory Visit (INDEPENDENT_AMBULATORY_CARE_PROVIDER_SITE_OTHER): Payer: Medicare HMO | Admitting: Podiatry

## 2020-06-11 ENCOUNTER — Other Ambulatory Visit: Payer: Self-pay

## 2020-06-11 ENCOUNTER — Encounter: Payer: Self-pay | Admitting: Nurse Practitioner

## 2020-06-11 ENCOUNTER — Encounter: Payer: Self-pay | Admitting: Podiatry

## 2020-06-11 DIAGNOSIS — L84 Corns and callosities: Secondary | ICD-10-CM

## 2020-06-11 DIAGNOSIS — E1142 Type 2 diabetes mellitus with diabetic polyneuropathy: Secondary | ICD-10-CM

## 2020-06-11 DIAGNOSIS — B351 Tinea unguium: Secondary | ICD-10-CM

## 2020-06-11 DIAGNOSIS — M79675 Pain in left toe(s): Secondary | ICD-10-CM

## 2020-06-11 DIAGNOSIS — M79674 Pain in right toe(s): Secondary | ICD-10-CM | POA: Diagnosis not present

## 2020-06-11 NOTE — Progress Notes (Signed)
Subjective: Chris [name]. presents today for follow up of preventative diabetic foot care and painful mycotic nails b/l that are difficult to trim. Pain interferes with ambulation. Aggravating factors include wearing enclosed shoe gear. Pain is relieved with periodic professional debridement.   He states his feet are continuing to look better with Amlactin Lotion and Vaseline Petroleum Jelly mixture.  He is on supplemental oxygen on today's visit.   He voices no new pedal concerns on today's visit.  Allergies  Allergen Reactions  . Bactrim [Sulfamethoxazole-Trimethoprim] Hives    Objective: There were no vitals filed for this visit.  Pt is a pleasant  54 y.o. year old AA  male, morbidly obese in NAD. AAO x 3. He has rolling oxygen tank and requires 3L O2 via nasal cannula.  Vascular Examination:  Capillary refill time to digits immediate b/l. Palpable DP pulses b/l. Palpable PT pulses b/l. Pedal hair absent b/l Skin temperature gradient within normal limits b/l.  Dermatological Examination: Pedal skin with normal turgor, texture and tone bilaterally. No open wounds bilaterally. No interdigital macerations bilaterally. Toenails 1-5 b/l elongated, discolored, dystrophic, thickened, crumbly with subungual debris and tenderness to dorsal palpation. Hyperkeratotic lesion(s) L hallux, R hallux and submet head 4 left foot.  No erythema, no edema, no drainage, no flocculence.   Musculoskeletal: Normal muscle strength 5/5 to all lower extremity muscle groups bilaterally, no pain crepitus or joint limitation noted with ROM b/l and hammertoes noted to the  L 4th toe, L 5th toe, R 4th toe and R 5th toe  Neurological: Pt has subjective symptoms of neuropathy. Protective sensation intact 5/5 intact bilaterally with 10g monofilament b/l. Vibratory sensation intact b/l.  Assessment: 1. Pain due to onychomycosis of toenails of both feet   2. Callus   3. Diabetic peripheral neuropathy associated  with type 2 diabetes mellitus (HCC)     Plan: -No new findings. No new orders. -Continue diabetic foot care principles. Literature dispensed on today.  -Toenails 1-5 b/l were debrided in length and girth with sterile nail nippers and dremel without iatrogenic bleeding.  -Calluses b/l hallux and submet head 4 left foot debrided without complication or incident. Total number debrided =3. -Patient to continue soft, supportive shoe gear daily. -Patient to report any pedal injuries to medical professional immediately. -Continue AmLactin Lotion 12% to be applied to feet twice daily mixing in a little Vaseline Petroleum Jelly after AmLactin Application. -Patient/POA to call should there be question/concern in the interim.  Return in about 3 months (around 09/11/2020) for diabetic foot care.

## 2020-06-16 DIAGNOSIS — J449 Chronic obstructive pulmonary disease, unspecified: Secondary | ICD-10-CM | POA: Diagnosis not present

## 2020-06-16 DIAGNOSIS — J129 Viral pneumonia, unspecified: Secondary | ICD-10-CM | POA: Diagnosis not present

## 2020-07-02 ENCOUNTER — Other Ambulatory Visit: Payer: Self-pay | Admitting: Nurse Practitioner

## 2020-07-07 ENCOUNTER — Other Ambulatory Visit: Payer: Self-pay | Admitting: Family

## 2020-07-07 DIAGNOSIS — B20 Human immunodeficiency virus [HIV] disease: Secondary | ICD-10-CM

## 2020-07-09 ENCOUNTER — Other Ambulatory Visit: Payer: Self-pay | Admitting: Nurse Practitioner

## 2020-07-09 DIAGNOSIS — J449 Chronic obstructive pulmonary disease, unspecified: Secondary | ICD-10-CM

## 2020-07-09 DIAGNOSIS — G8929 Other chronic pain: Secondary | ICD-10-CM

## 2020-07-09 DIAGNOSIS — J9601 Acute respiratory failure with hypoxia: Secondary | ICD-10-CM

## 2020-07-09 NOTE — Telephone Encounter (Signed)
Please review for refill. Refill not delegated per protocol.  Last refill: 06/04/20 Last OV: 06/04/20

## 2020-07-09 NOTE — Telephone Encounter (Signed)
Copied from CRM [id number]. Topic: Quick Communication - Rx Refill/Question >> Jul 09, 2020  4:57 PM Mcneil, Ja-Kwan wrote: Medication: acetaminophen-codeine (TYLENOL #3) 300-30 MG tablet  Has the patient contacted their pharmacy? no  Preferred Pharmacy (with phone number or street name): [id number] [place], [place] - [address]   Phone: [phone]   Fax: [phone]  Agent: Please be advised that RX refills may take up to 3 business days. We ask that you follow-up with your pharmacy.

## 2020-07-11 MED ORDER — ACETAMINOPHEN-CODEINE #3 300-30 MG PO TABS: 1.0000 | ORAL_TABLET | Freq: Two times a day (BID) | ORAL | 0 refills | Status: DC | PRN

## 2020-07-16 DIAGNOSIS — J129 Viral pneumonia, unspecified: Secondary | ICD-10-CM | POA: Diagnosis not present

## 2020-07-16 DIAGNOSIS — J449 Chronic obstructive pulmonary disease, unspecified: Secondary | ICD-10-CM | POA: Diagnosis not present

## 2020-07-17 ENCOUNTER — Other Ambulatory Visit: Payer: Self-pay

## 2020-07-17 ENCOUNTER — Encounter: Payer: Self-pay | Admitting: Family

## 2020-07-17 ENCOUNTER — Ambulatory Visit (INDEPENDENT_AMBULATORY_CARE_PROVIDER_SITE_OTHER): Payer: Medicare HMO | Admitting: Family

## 2020-07-17 VITALS — BP 143/86 | HR 96 | Temp 97.4°F | Wt 243.0 lb

## 2020-07-17 DIAGNOSIS — B20 Human immunodeficiency virus [HIV] disease: Secondary | ICD-10-CM

## 2020-07-17 DIAGNOSIS — Z0000 Encounter for general adult medical examination without abnormal findings: Secondary | ICD-10-CM

## 2020-07-17 DIAGNOSIS — R69 Illness, unspecified: Secondary | ICD-10-CM | POA: Diagnosis not present

## 2020-07-17 MED ORDER — SILDENAFIL CITRATE 100 MG PO TABS: 100.0000 mg | ORAL_TABLET | Freq: Every day | ORAL | 2 refills | Status: DC | PRN

## 2020-07-17 MED ORDER — BIKTARVY 50-200-25 MG PO TABS: 1.0000 | ORAL_TABLET | Freq: Every day | ORAL | 5 refills | Status: DC
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
## 2020-07-17 NOTE — Patient Instructions (Addendum)
Nice to see you.  We will check your lab work today.   Continue to take your [place] daily as prescribed.  Refills have been sent to the pharmacy.  Plan for follow up in 4 months or sooner if needed with lab work on the same day.  Have a great day and stay safe!  Happy Holidays!

## 2020-07-17 NOTE — Assessment & Plan Note (Signed)
   Declines influenza vaccine.  Interested in receiving a dose of Covid vaccine.  Discussed importance of safe sexual practice to reduce risk of STI.  Condoms declined.  Routine dental care up-to-date per recommendations.  Colon cancer screening through colonoscopy up-to-date through 2028.
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
## 2020-07-18 LAB — T-HELPER CELL (CD4) - (RCID CLINIC ONLY)
CD4 % Helper T Cell: 17 % — ABNORMAL LOW (ref 33–65)
CD4 T Cell Abs: 318 /uL — ABNORMAL LOW (ref 400–1790)

## 2020-07-19 LAB — COMPLETE METABOLIC PANEL WITH GFR
AG Ratio: 1.2 (calc) (ref 1.0–2.5)
ALT: 16 U/L (ref 9–46)
AST: 17 U/L (ref 10–35)
Albumin: 4.1 g/dL (ref 3.6–5.1)
Alkaline phosphatase (APISO): 49 U/L (ref 35–144)
BUN: 17 mg/dL (ref 7–25)
CO2: 25 mmol/L (ref 20–32)
Calcium: 9.1 mg/dL (ref 8.6–10.3)
Chloride: 102 mmol/L (ref 98–110)
Creat: 1.25 mg/dL (ref 0.70–1.33)
GFR, Est African American: 75 mL/min/{1.73_m2} (ref >=60)
GFR, Est Non African American: 65 mL/min/{1.73_m2} (ref >=60)
Globulin: 3.5 g/dL (calc) (ref 1.9–3.7)
Glucose, Bld: 165 mg/dL — ABNORMAL HIGH (ref 65–99)
Potassium: 4.8 mmol/L (ref 3.5–5.3)
Sodium: 138 mmol/L (ref 135–146)
Total Bilirubin: 0.3 mg/dL (ref 0.2–1.2)
Total Protein: 7.6 g/dL (ref 6.1–8.1)

## 2020-07-19 LAB — HIV-1 RNA QUANT-NO REFLEX-BLD
HIV 1 RNA Quant: 21 Copies/mL — ABNORMAL HIGH
HIV-1 RNA Quant, Log: 1.32 Log cps/mL — ABNORMAL HIGH

## 2020-07-31 ENCOUNTER — Telehealth: Payer: Self-pay | Admitting: Internal Medicine

## 2020-07-31 MED ORDER — BREZTRI AEROSPHERE 160-9-4.8 MCG/ACT IN AERO: 2.0000 | INHALATION_SPRAY | Freq: Two times a day (BID) | RESPIRATORY_TRACT | 0 refills | Status: DC

## 2020-07-31 MED ORDER — PREDNISONE 10 MG PO TABS: 10.0000 mg | ORAL_TABLET | Freq: Every day | ORAL | 0 refills | Status: DC

## 2020-07-31 NOTE — Telephone Encounter (Signed)
I have placed sample of Breztri up front for the pt and advised 2 puffs first thing in the am and 2 puffs 12 hours later rinse mouth well after using. Pt to call if he is not improving.

## 2020-07-31 NOTE — Telephone Encounter (Signed)
I called and spoke with the pt  He is aware that we are sending in pred taper- rx sent  He called pharm and was advised that he can get his albuterol inh today, but bevespi will not be able to be picked up until 12/23 Is it okay to give him sample if Chris [name] so he is not without maintenance inhaler?  Please advise, thanks!

## 2020-07-31 NOTE — Telephone Encounter (Signed)
Prednisone 10 mg take  4 each am x 2 days,   2 each am x 2 days,  1 each am x 2 days and stop  

## 2020-07-31 NOTE — Telephone Encounter (Signed)
Called and spoke to pt. Pt c/o increase in sob with little activity, chest congestion, non prod cough, wheezing x 1 week. Pt denies CP/tightness, f/c/s, dizziness, swelling. Pt states he always has to sleep with two pillows under his head to help him breathe. Pt has had his covid vaccine and has not been around anyone with covid that he is aware of. Pt has been taking his Bevespi and albuterol hfa more than prescribed, at least 4 times per day. Pt states he is now out of his albuterol hfa and will be running out of his Bevespi in 3 days. Pt is going to call his pharmacy to see when they can both be refilled. Pt educated on the importance of taking medication as prescribed. Pt last seen for his COPD on 12/06/19 by Dr. Sherene Sires.   Dr. Sherene Sires, please advise on recs. Thanks.    OV instructions from 4.27.21: Instructions  Wear your 02 2lpm 24/7   Clotrimazole troche 5 x daily until gone   Stop losartan  Continue bevespi  Take 2 puffs first thing in am and then another 2 puffs about 12 hours later.    Only use your albuterol as a rescue medication to be used if you can't catch your breath by resting or doing a relaxed purse lip breathing pattern.  - The less you use it, the better it will work when you need it. - Ok to use up to 2 puffs  every 4 hours if you must but call for immediate appointment if use goes up over your usual need - Don't leave home without it !!  (think of it like the spare tire for your car)    We are referring you to Dr Wynona Neat for sleep evaluation and ok to follow up with him or me after he completes your evaluation

## 2020-07-31 NOTE — Telephone Encounter (Signed)
That's fine. thanks

## 2020-08-02 ENCOUNTER — Other Ambulatory Visit: Payer: Self-pay | Admitting: Nurse Practitioner

## 2020-08-08 ENCOUNTER — Other Ambulatory Visit: Payer: Self-pay | Admitting: Nurse Practitioner

## 2020-08-08 DIAGNOSIS — J449 Chronic obstructive pulmonary disease, unspecified: Secondary | ICD-10-CM

## 2020-08-08 DIAGNOSIS — G8929 Other chronic pain: Secondary | ICD-10-CM

## 2020-08-08 DIAGNOSIS — J9601 Acute respiratory failure with hypoxia: Secondary | ICD-10-CM

## 2020-08-08 DIAGNOSIS — M5441 Lumbago with sciatica, right side: Secondary | ICD-10-CM

## 2020-08-08 NOTE — Telephone Encounter (Signed)
Requested medication (s) are due for refill today:  Yes  Requested medication (s) are on the active medication list:  Yes  Future visit scheduled:  No  Last Refill: 07/11/20; #60; no refills  Notes to clinic: medication is not delegated   Requested Prescriptions  Pending Prescriptions Disp Refills   acetaminophen-codeine (TYLENOL #3) 300-30 MG tablet 60 tablet 0    Sig: Take 1-2 tablets by mouth every 12 (twelve) hours as needed for moderate pain.      Not Delegated - Analgesics:  Opioid Agonist Combinations Failed - 08/08/2020 10:51 AM      Failed - This refill cannot be delegated      Failed - Urine Drug Screen completed in last 360 days      Passed - Valid encounter within last 6 months    Recent Outpatient Visits           2 months ago Type 2 diabetes mellitus with hyperglycemia, without long-term current use of insulin (HCC)   [facility] [place], [place] W, NP   3 months ago Type 2 diabetes mellitus with hyperglycemia, without long-term current use of insulin (HCC)   [facility] [place], Katherine, MD   6 months ago Type 2 diabetes mellitus with hyperglycemia, without long-term current use of insulin [facility])   [facility] [place], Shea Stakes, NP   9 months ago Encounter for annual physical exam   [facility] [place], [place] W, NP   10 months ago Type 2 diabetes mellitus with complication, without long-term current use of insulin [facility])   [place] [facility] [place], Shea Stakes, NP       Future Appointments             In 3 months Calone, Tama Headings, FNP [facility] for Infectious Disease, [facility]

## 2020-08-08 NOTE — Telephone Encounter (Signed)
Medication Refill - Medication: Acetaminophen Codeine  Has the patient contacted their pharmacy? Yes.   (Agent: If no, request that the patient contact the pharmacy for the refill.) (Agent: If yes, when and what did the pharmacy advise?)  Preferred Pharmacy (with phone number or street name):  [id number] [place] [place] - [address] [facility] [address]  [address] [place] [address]  Phone: [phone] Fax: [phone]  Hours: Open 24 hours     Agent: Please be advised that RX refills may take up to 3 business days. We ask that you follow-up with your pharmacy.

## 2020-08-12 MED ORDER — ACETAMINOPHEN-CODEINE #3 300-30 MG PO TABS: 1.0000 | ORAL_TABLET | Freq: Two times a day (BID) | ORAL | 0 refills | Status: DC | PRN

## 2020-08-16 DIAGNOSIS — J129 Viral pneumonia, unspecified: Secondary | ICD-10-CM | POA: Diagnosis not present

## 2020-08-16 DIAGNOSIS — J449 Chronic obstructive pulmonary disease, unspecified: Secondary | ICD-10-CM | POA: Diagnosis not present

## 2020-08-20 DIAGNOSIS — Z1152 Encounter for screening for COVID-19: Secondary | ICD-10-CM | POA: Diagnosis not present

## 2020-08-22 ENCOUNTER — Telehealth: Payer: Self-pay | Admitting: Internal Medicine

## 2020-08-22 MED ORDER — AZITHROMYCIN 250 MG PO TABS: ORAL_TABLET | ORAL | 0 refills | Status: DC

## 2020-08-22 MED ORDER — PREDNISONE 10 MG PO TABS: ORAL_TABLET | ORAL | 0 refills | Status: DC

## 2020-08-22 NOTE — Telephone Encounter (Signed)
Called and spoke with Patient.  Dr. Thurston Hole recommendations given.  Understanding stated.  [facility]. Nothing further at this time.

## 2020-08-22 NOTE — Telephone Encounter (Signed)
Ok to do zpak and Prednisone 10 mg take  4 each am x 2 days,   2 each am x 2 days,  1 each am x 2 days and stop    I would not recommend he go to ER and hope his vaccine will protect him from getting much worse but if sob at rest p albuterol  or sats not > 90% at rest on max 02 no choice but go to er - would probably call 911 if this happens to expedite ER eval (but only with the above criteria met)

## 2020-08-22 NOTE — Telephone Encounter (Signed)
Called and spoke with Patient.  Patient stated he is having body aches,productive cough, with thick white/yellow sputum. Patient stated he has head congestion, runny nose, and has increased sob.  Patient stated he is using O2 4L [place] at this time. Patient stated he is using Bevespi and using his emergency inhaler.  Patient stated he is using albuterol inhaler 4-8 times a day, with little relief.  Patient is taking Robitussin cough med. Patient stated he did not know if he had any fever and was unable to check his O2 sats. Patient has been covid vaccinated, but has had covid exposure. Patient stated over the past two weeks he was exposed to his Wife, Son, and Lucila Maine, who all were covid positive and recovered. Patient stated he was covid tested 08/20/20 and waiting for results.  LOV with Dr. [name]-12/06/19  Instructions  Wear your 02 2lpm 24/7   Clotrimazole troche 5 x daily until gone   Stop losartan  Continue bevespi  Take 2 puffs first thing in am and then another 2 puffs about 12 hours later.    Only use your albuterol as a rescue medication to be used if you can't catch your breath by resting or doing a relaxed purse lip breathing pattern.  - The less you use it, the better it will work when you need it. - Ok to use up to 2 puffs  every 4 hours if you must but call for immediate appointment if use goes up over your usual need - Don't leave home without it !!  (think of it like the spare tire for your car)    We are referring you to Dr Wynona Neat for sleep evaluation and ok to follow up with him or me after he completes your evaluation      Message routed to Dr. Sherene Sires

## 2020-08-23 ENCOUNTER — Other Ambulatory Visit: Payer: Self-pay | Admitting: Family Medicine

## 2020-08-29 ENCOUNTER — Other Ambulatory Visit: Payer: Self-pay | Admitting: Nurse Practitioner

## 2020-08-29 NOTE — Telephone Encounter (Signed)
Requested medications are due for refill today yes  Requested medications are on the active medication list yes  Last refill 12/23  Last visit 03/2020  Future visit scheduled no  Notes to clinic new med added unrelated to a visit, unsure if trial or to be continued. No upcoming appt scheduled.

## 2020-08-30 IMAGING — DX DG CHEST 2V
2 series · 2 of 2 positions shown · non-contrast
Comparison: 05/17/2018

CLINICAL DATA: Shortness of breath, history COPD

EXAM:
CHEST - 2 VIEW

[chest pa]
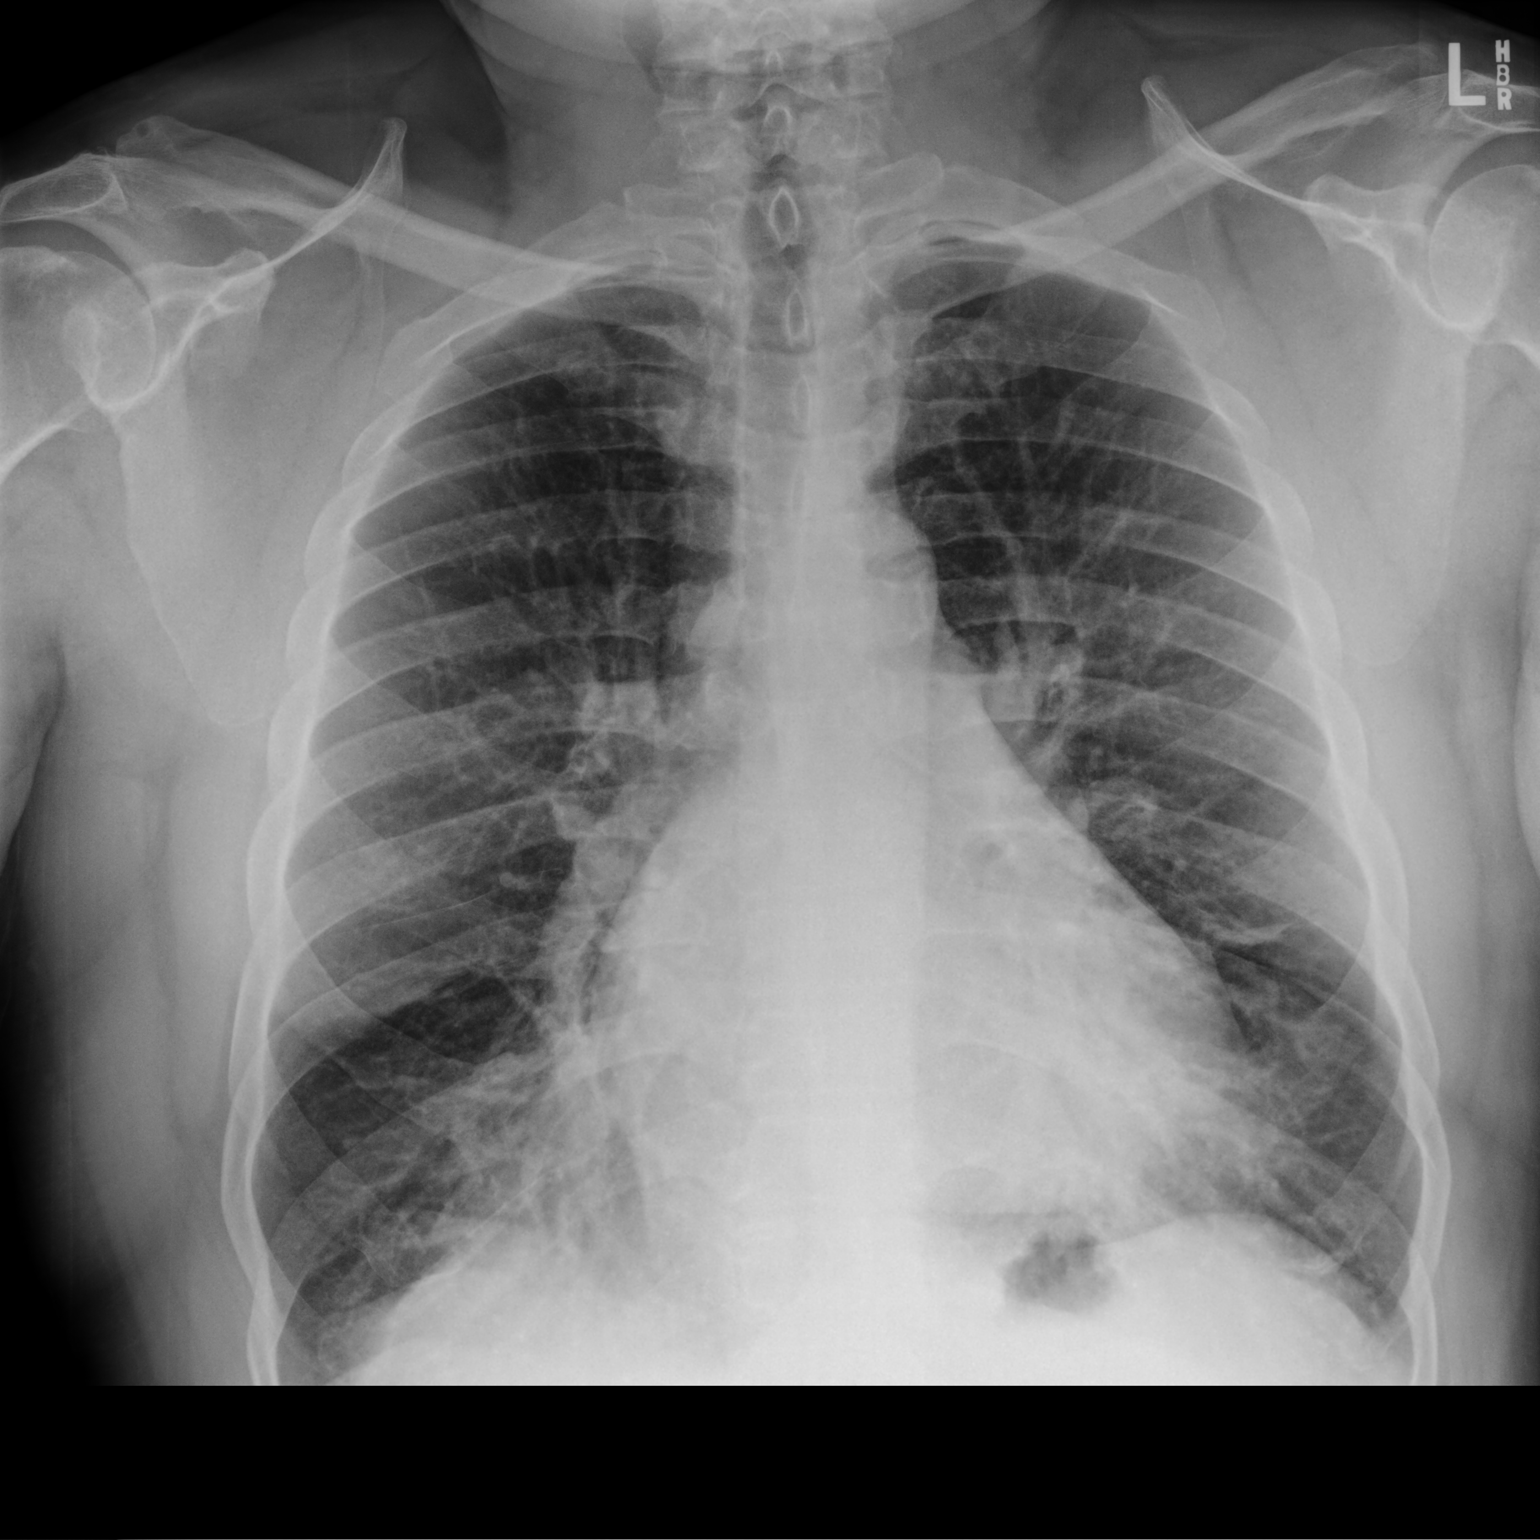

[chest lat]
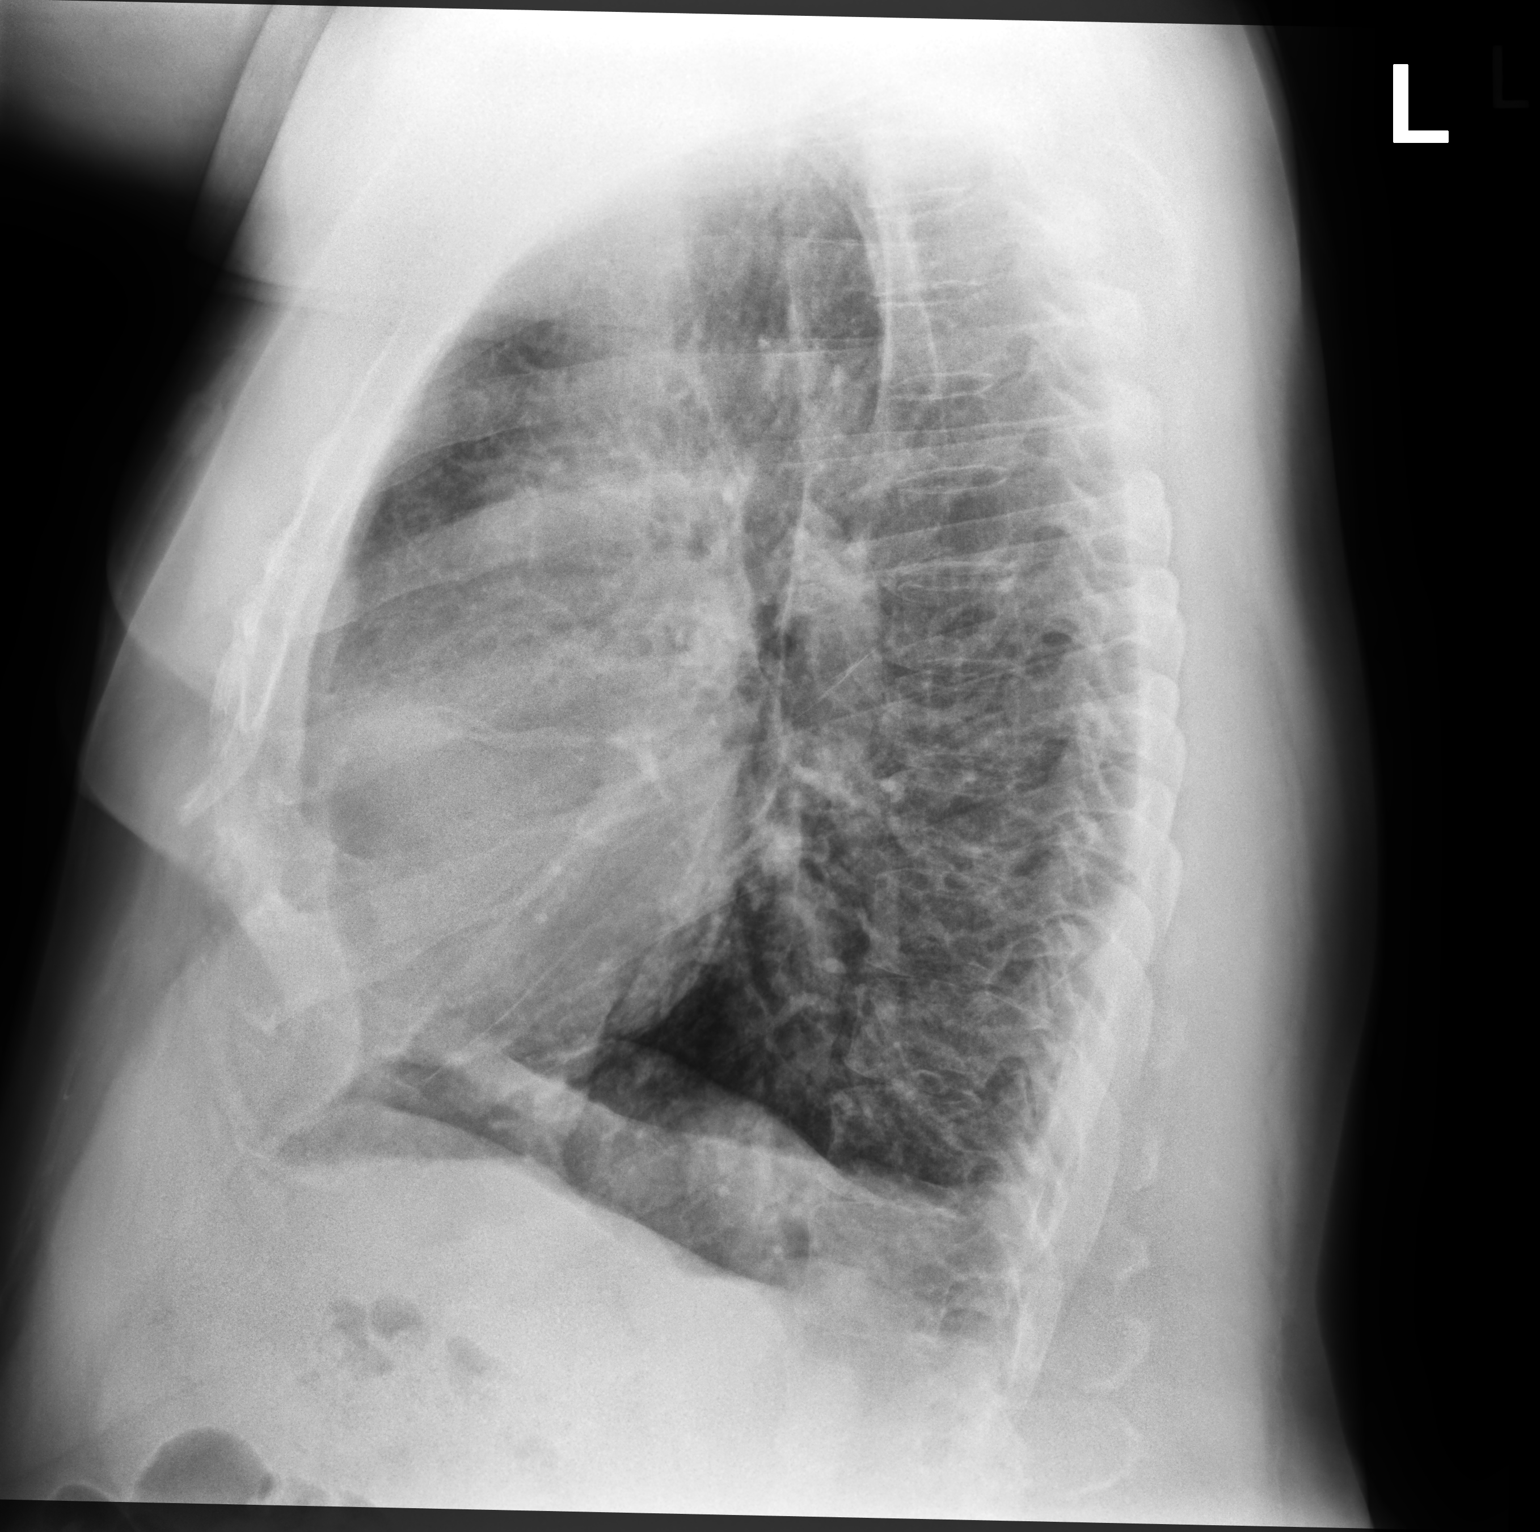

[2 of 2 positions shown; findings below may reference images not displayed]

FINDINGS: Enlargement of cardiac silhouette with slight vascular congestion.

Mediastinal contours normal.

Scarring at lingula.

Increased markings RIGHT middle lobe and prominent fat pad at RIGHT
cardiophrenic angle unchanged.

Emphysematous changes without definite acute infiltrate, pleural
effusion, or pneumothorax.

Osseous structures unremarkable.
IMPRESSION: Emphysematous changes with chronic accentuation of RIGHT middle lobe
markings.

Enlargement of cardiac silhouette with pulmonary vascular
congestion.

No definite acute infiltrate.

## 2020-09-03 ENCOUNTER — Other Ambulatory Visit: Payer: Self-pay | Admitting: Nurse Practitioner

## 2020-09-03 DIAGNOSIS — M5441 Lumbago with sciatica, right side: Secondary | ICD-10-CM

## 2020-09-03 DIAGNOSIS — J9601 Acute respiratory failure with hypoxia: Secondary | ICD-10-CM

## 2020-09-03 DIAGNOSIS — G8929 Other chronic pain: Secondary | ICD-10-CM

## 2020-09-03 DIAGNOSIS — Z8679 Personal history of other diseases of the circulatory system: Secondary | ICD-10-CM

## 2020-09-03 DIAGNOSIS — J449 Chronic obstructive pulmonary disease, unspecified: Secondary | ICD-10-CM

## 2020-09-03 MED ORDER — ACETAMINOPHEN-CODEINE #3 300-30 MG PO TABS: 1.0000 | ORAL_TABLET | Freq: Two times a day (BID) | ORAL | 0 refills | Status: DC | PRN

## 2020-09-03 NOTE — Telephone Encounter (Signed)
Copied from CRM [id number]. Topic: Quick Communication - Rx Refill/Question >> Sep 03, 2020  4:17 PM Mcneil, Ja-Kwan wrote: Medication: acetaminophen-codeine (TYLENOL #3) 300-30 MG tablet and apixaban (ELIQUIS) 5 MG TABS tablet  Has the patient contacted their pharmacy? no  Preferred Pharmacy (with phone number or street name): [id number] - [place], [place] - [address]  Phone: [phone]   Fax: [phone]   Agent: Please be advised that RX refills may take up to 3 business days. We ask that you follow-up with your pharmacy.

## 2020-09-03 NOTE — Telephone Encounter (Signed)
Notes to clinic:  Eliquis filled by a different provider This refill cannot be delegated  Review for refill   Requested Prescriptions  Pending Prescriptions Disp Refills   apixaban (ELIQUIS) 5 MG TABS tablet 180 tablet 3    Sig: Take 1 tablet (5 mg total) by mouth 2 (two) times daily.      Hematology:  Anticoagulants Passed - 09/03/2020  4:31 PM      Passed - HGB in normal range and within 360 days    Hemoglobin  Date Value Ref Range Status  05/03/2020 14.4 13.0 - 17.0 g/dL Final  53/61/4431 54.0 13.0 - 17.7 g/dL Final          Passed - PLT in normal range and within 360 days    Platelets  Date Value Ref Range Status  05/03/2020 214 150 - 400 K/uL Final  09/22/2019 281 150 - 450 x10E3/uL Final          Passed - HCT in normal range and within 360 days    HCT  Date Value Ref Range Status  05/03/2020 48.5 39.0 - 52.0 % Final   Hematocrit  Date Value Ref Range Status  09/22/2019 44.6 37.5 - 51.0 % Final          Passed - Cr in normal range and within 360 days    Creat  Date Value Ref Range Status  07/17/2020 1.25 0.70 - 1.33 mg/dL Final    Comment:    For patients >43 years of age, the reference limit for Creatinine is approximately 13% higher for people identified as African-American. .    Creatinine, Urine  Date Value Ref Range Status  10/08/2017 122.33 mg/dL Final    Comment:    Performed at [facility], [address], [place], [place] [address]          Passed - Valid encounter within last 12 months    Recent Outpatient Visits           3 months ago Type 2 diabetes mellitus with hyperglycemia, without long-term current use of insulin [name])   [facility] [place], [place] W, NP   4 months ago Type 2 diabetes mellitus with hyperglycemia, without long-term current use of insulin (HCC)   [facility] [place], [place], MD   7 months ago Type 2 diabetes mellitus with hyperglycemia, without  long-term current use of insulin [facility])   [name] [place], Shea Stakes, NP   10 months ago Encounter for annual physical exam   [facility] [place], [place] W, NP   11 months ago Type 2 diabetes mellitus with complication, without long-term current use of insulin [facility]   [place] [facility] [place], Shea Stakes, NP       Future Appointments             In 2 months Carver Fila, Tama Headings, FNP [facility]               acetaminophen-codeine (TYLENOL #3) 300-30 MG tablet 60 tablet 0    Sig: Take 1-2 tablets by mouth every 12 (twelve) hours as needed for moderate pain.      Not Delegated - Analgesics:  Opioid Agonist Combinations Failed - 09/03/2020  4:31 PM      Failed - This refill cannot be delegated      Failed - Urine Drug  Screen completed in last 360 days      Passed - Valid encounter within last 6 months    Recent Outpatient Visits           3 months ago Type 2 diabetes mellitus with hyperglycemia, without long-term current use of insulin [facility])   [facility] [place], [place] W, NP   4 months ago Type 2 diabetes mellitus with hyperglycemia, without long-term current use of insulin (HCC)   [facility] [place], [place], MD   7 months ago Type 2 diabetes mellitus with hyperglycemia, without long-term current use of insulin [facility])   [facility] [place], Shea Stakes, NP   10 months ago Encounter for annual physical exam   [facility] [place], [place] W, NP   11 months ago Type 2 diabetes mellitus with complication, without long-term current use of insulin [facility])    [facility] [place], Shea Stakes, NP       Future Appointments             In 2 months [place], Tama Headings, FNP [facility], [facility]

## 2020-09-13 ENCOUNTER — Other Ambulatory Visit: Payer: Self-pay | Admitting: Family Medicine

## 2020-09-16 DIAGNOSIS — J129 Viral pneumonia, unspecified: Secondary | ICD-10-CM | POA: Diagnosis not present

## 2020-09-16 DIAGNOSIS — J449 Chronic obstructive pulmonary disease, unspecified: Secondary | ICD-10-CM | POA: Diagnosis not present

## 2020-09-17 ENCOUNTER — Ambulatory Visit: Payer: Medicare HMO | Admitting: Podiatry

## 2020-09-20 ENCOUNTER — Ambulatory Visit: Payer: Medicare HMO | Admitting: Podiatry

## 2020-09-24 ENCOUNTER — Telehealth: Payer: Self-pay | Admitting: Internal Medicine

## 2020-09-24 ENCOUNTER — Other Ambulatory Visit: Payer: Self-pay

## 2020-09-24 ENCOUNTER — Other Ambulatory Visit: Payer: Medicare HMO

## 2020-09-24 DIAGNOSIS — Z20822 Contact with and (suspected) exposure to covid-19: Secondary | ICD-10-CM | POA: Diagnosis not present

## 2020-09-24 MED ORDER — DOXYCYCLINE HYCLATE 100 MG PO TABS: 100.0000 mg | ORAL_TABLET | Freq: Two times a day (BID) | ORAL | 0 refills | Status: DC

## 2020-09-24 MED ORDER — PREDNISONE 10 MG PO TABS: ORAL_TABLET | ORAL | 0 refills | Status: DC

## 2020-09-24 NOTE — Telephone Encounter (Signed)
Spoke with pt and advised of recommendations per Dr Sherene Sires. Pt verbalized understanding. I gave pt the number to call to set up covid testing. Meds sent to pharmacy. F/U appt scheduled with Dr Sherene Sires. Nothing further needed.

## 2020-09-24 NOTE — Telephone Encounter (Signed)
Refer for covid testing Doxy 100 mg bid x 10 days Prednisone 10 mg take  4 each am x 2 days,   2 each am x 2 days,  1 each am x 2 days and stop   Ov 2 weeks with all meds to regroup

## 2020-09-24 NOTE — Telephone Encounter (Signed)
Primary Pulmonologist: wert Last office visit and with whom: 12/06/19 - [name] What do we see them for (pulmonary problems): COPD Last OV assessment/plan:  Instructions  Wear your 02 2lpm 24/7   Clotrimazole troche 5 x daily until gone   Stop losartan  Continue bevespi  Take 2 puffs first thing in am and then another 2 puffs about 12 hours later.    Only use your albuterol as a rescue medication to be used if you can't catch your breath by resting or doing a relaxed purse lip breathing pattern.  - The less you use it, the better it will work when you need it. - Ok to use up to 2 puffs  every 4 hours if you must but call for immediate appointment if use goes up over your usual need - Don't leave home without it !!  (think of it like the spare tire for your car)    We are referring you to Dr Wynona Neat for sleep evaluation and ok to follow up with him or me after he completes your evaluation        Was appointment offered to patient (explain)?  No   Reason for call: Spoke with pt. He c/o increased SOB for 1 week. Occas prod cough (white). Wheezing. Body aches. No Fever. No chills.  Runny nose. Using albuterol inh 6-7 x daily and Bevespi 3-4 x daily. Wearing oxygen at 4L [place]. Pt denies recent covid exposure. Last covid test was 2 months ago. Pfizer vaccine x 2 10/2019 and 11/2019. Has not had flu vaccine. Last given Zpak and pred taper on 08/22/20. Pt reports this did help.   Allergies  Allergen Reactions  . Bactrim [Sulfamethoxazole-Trimethoprim] Hives    Immunization History  Administered Date(s) Administered  . Hepatitis A, Adult 03/03/2013, 12/21/2014  . Influenza Split 03/11/2013  . Influenza Whole 05/11/2012  . Influenza,inj,Quad PF,6+ Mos 04/20/2014, 06/18/2015, 06/23/2016, 05/19/2017, 06/22/2018, 04/25/2019  . Meningococcal Mcv4o 11/13/2016, 06/22/2018  . PFIZER(Purple Top)SARS-COV-2 Vaccination 11/05/2019, 11/25/2019  . PPD Test 03/16/2013  . Pneumococcal Conjugate-13  03/03/2018  . Pneumococcal Polysaccharide-23 05/11/2012, 12/07/2018

## 2020-09-27 ENCOUNTER — Ambulatory Visit (INDEPENDENT_AMBULATORY_CARE_PROVIDER_SITE_OTHER): Payer: Medicare HMO | Admitting: Podiatry

## 2020-09-27 ENCOUNTER — Encounter: Payer: Self-pay | Admitting: Podiatry

## 2020-09-27 ENCOUNTER — Other Ambulatory Visit: Payer: Self-pay

## 2020-09-27 DIAGNOSIS — E1142 Type 2 diabetes mellitus with diabetic polyneuropathy: Secondary | ICD-10-CM | POA: Diagnosis not present

## 2020-09-27 DIAGNOSIS — M79675 Pain in left toe(s): Secondary | ICD-10-CM | POA: Diagnosis not present

## 2020-09-27 DIAGNOSIS — B351 Tinea unguium: Secondary | ICD-10-CM

## 2020-09-27 DIAGNOSIS — L84 Corns and callosities: Secondary | ICD-10-CM

## 2020-09-27 DIAGNOSIS — M79674 Pain in right toe(s): Secondary | ICD-10-CM | POA: Diagnosis not present

## 2020-09-27 LAB — NOVEL CORONAVIRUS, NAA: SARS-CoV-2, NAA: NOT DETECTED

## 2020-09-30 NOTE — Progress Notes (Signed)
Subjective:   Patient ID: Chris [name]., male   DOB: 55 y.o.   MRN: [id number]   HPI Patient presents with painful calluses bilateral and nail disease 1-5 both feet that are thick and hard for the patient to cut toenails.  States is been going on a long time   ROS      Objective:  Physical Exam  Neurovascular status intact with no other changes with thick yellow brittle nailbeds and keratotic lesion subfifth metatarsal painful     Assessment:  Chronic mycotic nail infection with pain 1-5 both feet with lesions of the fifth metatarsal both feet no iatrogenic bleeding     Plan:  Debrided mycotic nails debrided lesions no bleeding noted and advised on good shoes and diabetic education and reappoint as needed

## 2020-10-04 ENCOUNTER — Other Ambulatory Visit: Payer: Self-pay | Admitting: Nurse Practitioner

## 2020-10-04 DIAGNOSIS — J9601 Acute respiratory failure with hypoxia: Secondary | ICD-10-CM

## 2020-10-04 DIAGNOSIS — J449 Chronic obstructive pulmonary disease, unspecified: Secondary | ICD-10-CM

## 2020-10-04 DIAGNOSIS — G8929 Other chronic pain: Secondary | ICD-10-CM

## 2020-10-08 ENCOUNTER — Encounter: Payer: Self-pay | Admitting: Internal Medicine

## 2020-10-08 ENCOUNTER — Ambulatory Visit (INDEPENDENT_AMBULATORY_CARE_PROVIDER_SITE_OTHER): Payer: Medicare HMO | Admitting: Internal Medicine

## 2020-10-08 ENCOUNTER — Other Ambulatory Visit: Payer: Self-pay

## 2020-10-08 ENCOUNTER — Ambulatory Visit: Payer: Medicare HMO

## 2020-10-08 VITALS — BP 134/84 | HR 98 | Temp 98.3°F | Ht 69.0 in | Wt 254.2 lb

## 2020-10-08 DIAGNOSIS — R69 Illness, unspecified: Secondary | ICD-10-CM | POA: Diagnosis not present

## 2020-10-08 DIAGNOSIS — J441 Chronic obstructive pulmonary disease with (acute) exacerbation: Secondary | ICD-10-CM

## 2020-10-08 DIAGNOSIS — J9611 Chronic respiratory failure with hypoxia: Secondary | ICD-10-CM | POA: Diagnosis not present

## 2020-10-08 DIAGNOSIS — F17210 Nicotine dependence, cigarettes, uncomplicated: Secondary | ICD-10-CM | POA: Diagnosis not present

## 2020-10-08 DIAGNOSIS — J9612 Chronic respiratory failure with hypercapnia: Secondary | ICD-10-CM

## 2020-10-08 DIAGNOSIS — J449 Chronic obstructive pulmonary disease, unspecified: Secondary | ICD-10-CM

## 2020-10-08 MED ORDER — ACETAMINOPHEN-CODEINE #3 300-30 MG PO TABS
1.0000 | ORAL_TABLET | Freq: Two times a day (BID) | ORAL | 0 refills | Status: AC | PRN
Start: 2020-10-08 — End: 2020-11-07

## 2020-10-08 MED ORDER — BREZTRI AEROSPHERE 160-9-4.8 MCG/ACT IN AERO: 2.0000 | INHALATION_SPRAY | Freq: Two times a day (BID) | RESPIRATORY_TRACT | 0 refills | Status: DC

## 2020-10-08 NOTE — Progress Notes (Signed)
Subjective:    Patient ID: Chris [name], male    DOB: [birth year]   MRN: [id number]    Brief patient profile:  36 yobm active smoker with HIV  and doe x 2009 much worse since March 2014 so referred by Chris [name] to pulmonary clinic 02/15/2013 with GOLD III copd by pfts 04/28/13.     History of Present Illness  02/15/2013 1st pulmonary eval  Cc indolent onset progressive doe x walking slow pace more than 100 ft,  not at rest., 02 dep, doe much worse x 3 months assoc with congested cough esp in am with sev tbsp thick white mucus takes about 30 min to clear.  No better on inhalers tried to date including advair and spiriva - albuterol works the best and using lots of saba and nebs rec Stop advair and spiriva Plan A = Automatic = Start symbicort 160 Take 2 puffs first thing in am and then another 2 puffs about 12 hours later.  Plan B = backup= Only use your albuterol (as a rescue medication to be used if you can't catch your breath by resting or doing a relaxed purse lip breathing pattern. The less you use it, the better it will work when you need it. Ok to use up to 2 puffs every 4 hours Plan C = nebulizer, use this only if plan B doesn't work ok to use up to 4 hours       05/26/2017  Acute  ov/Chris [name] re:   COPD III/ symb/spiriva rare saba / 02 2pm hs then during day prn  Chief Complaint  Patient presents with  . Acute Visit    Increased DOE x 3 days.   most days gets up 6 am with sob/some congestion > symb x 2 and then spiriva and saba and maybe once a week with saba hfa / did not bring it with him  Then worse gradually x 3 days, waking up 4am not using rescue then  Not much cough-   does not bother sleep until 4 am but even then minimal vol of mucus/ white  Needing 02 2lpm 24/7 now  On PPI bid ac  rec Plan A = Automatic =  symbicort 160 Take 2 puffs first thing in am and then another 2 puffs about 12 hours later.                                       spiriva 2 puffs  Work on inhaler  technique:   Plan B = Backup Only use your albuterol as a rescue medication Plan C = Crisis - only use your albuterol nebulizer if you first try Plan B The key is to stop smoking completely before smoking completely stops you!  Prednisone 10 mg take  4 each am x 2 days,   2 each am x 2 days,  1 each am x 2 days and stop  Please schedule a follow up visit in 3 months but call sooner if needed    Admit date: 07/21/2017 Discharge date: 07/28/2017  Time spent: 35 minutes  Recommendations for Outpatient Follow-up:  1. Repeat basic metabolic panel to follow electrolytes and renal function 2. Patient needs outpatient sleep study to sit up and determine candidacy for CPAP. 3. Reassess volume status and blood pressure, adjust medications as needed. 4. Close follow-up to patient's CBG and A1c; further adjust hypoglycemic regimen as indicated.  Discharge Diagnoses:  Active Problems:   HIV disease ([place])   COPD  GOLD III, still smoking   Steroid-induced hyperglycemia   Chronic respiratory failure with hypoxia and hypercapnia (HCC)   DM (diabetes mellitus) (HCC)   COPD exacerbation (HCC)   Obstructive sleep apnea syndrome   Essential hypertension, benign   Atopic eczema   Type 2 diabetes mellitus with complication (HCC)   Morbid (severe) obesity due to excess calories ([place])   COPD with acute exacerbation (HCC)   Depression   GERD (gastroesophageal reflux disease)   Acute on chronic respiratory failure with hypoxia ([place])    08/05/2017  Post hosp  f/u ov/Chris [name] re:  p copd exac - did not follow action plan reviewed at last ov prior to ER Chief Complaint  Patient presents with  . Follow-up    hospital f/u- [facility], D/C last week with COPD, using 3L [place], still has some SOB  with 02    -  MMRC2 = can't walk a nl pace on a flat grade s sob but does fine slow and flat  Using 02 as needed daytime and 2lpm hs  rec Work on inhaler technique:   Plan A = Automatic =  symbicort 160 Take 2 puffs  first thing in am and then another 2 puffs about 12 hours later. spiriva 2 puffs in am only  Plan B = Backup Only use your albuterol as a rescue medication  Plan C = Crisis - only use your albuterol nebulizer if you first try Plan B and it fails to help > ok to use the nebulizer up to every 4 hours but if start needing it regularly call for immediate appointment The key is to stop smoking completely before smoking completely stops you!  Keep your follow up appt but bring all inhalers and nebulizer solutions with you to this visit    09/22/2017  f/u ov/Chris [name] re: still smoking/ did not bring meds as rec/ saba hfa maybe twice  A week  On maint symb 160/spiriva  Chief Complaint  Patient presents with  . Follow-up    Pt c/o chest tightness on the left off and on for the past 3 wks.   Dyspnea:  Better than usual able to do [employer] slow pace = MMRC2 = can't walk a nl pace on a flat grade s sob but does fine slow and flat  Cough: minimal/ mostly am mucoid Sleep: on 2lpm on side and 2 pillows ok  rec The key is to stop smoking completely before smoking completely stops you!  Ok to try symb 80 Take 2 puffs first thing in am and then another 2 puffs about 12 hours later to see if breathing is as good For thrush> clotrimazole troche 10 mg up to 4 x daily as needed     05/17/2018  f/u ov/Chris [name] re:  GOLD III/ 02 dep / still smoking Chief Complaint  Patient presents with  . Follow-up    Breathing is overall doing well. He is using his albuterol inhaler once daily on average.   Dyspnea:  MMRC3 = can't walk 100 yards even at a slow pace at a flat grade s stopping due to sob even on 3lpm  Cough: min productions Sleeping: flat / on side with one pillow and  SABA maybe once a day when over does it  02: use:2lpm 24/7 except sometimes turns up with walking to 3lpm   rec No change medications for now - at next refills stop the symbicort  and incruse and change to BEVESPI Take 2 puffs first thing in am and  then another 2 puffs about 12 hours later to see if helps breathing and voice     11/17/2019 acute extended  ov/Chris [name] re: ? aecopd vs acei effects   GOLD III/ 02 prn maint on bevespi 2 bid  Chief Complaint  Patient presents with  . Follow-up    Has been using his Bevespi 4 times a day despite being prescribed for twice a day. Increased SOB for the past month, with exertion. Still on 2L.   Dyspnea:  Ok sitting still but gradually worse doe = MMRC3 = can't walk 100 yards even at a slow pace at a flat grade s stopping due to sob   Cough: dry cough on acei feels like chaulk in his throat  Sleeping: flat bed on side 2 pillows - can't lie on back s choking SABA use: way too much 02: 2lpm 24/7  rec Stop bevespi and start Breztri Take 2 puffs first thing in am and then another 2 puffs about 12 hours later.  Only use your albuterol (ventolin) as a rescue medication Prednisone 10 mg take  4 each am x 2 days,   2 each am x 2 days,  1 each am x 2 days and stop  Stop lisinopril and start losartan 25 mg daily    Please schedule a follow up office visit in 2 weeks, call sooner if needed with all medications /inhalers/ solutions    12/06/2019  f/u ov/Sharryn Belding re: GOLD III/ still smoking/ not using 02 approp on besvespi 2bid Chief Complaint  Patient presents with  . Follow-up    Breathing had improved some- he liked taking the breztri but states it made his throat sore. He is using his albuterol inhaler 2 x per day on average.   Dyspnea:  One aisle at [employer] leaning on cart s 02  = MMRC3 = can't walk 100 yards even at a slow pace at a flat grade s stopping due to sob   Cough: no Sleeping: on side/ can't lie on back s choking > better on 2 pillows> canceled split night study  SABA use: as above  02: 2lpm prn based on sob / not checking sats  rec Wear your 02 2lpm 24/7  Clotrimazole troche 5 x daily until gone  Stop losartan Continue bevespi  Take 2 puffs first thing in am and then another 2 puffs about 12  hours later.  Only use your albuterol as a rescue medication to be used if you can't catch your breath by resting or doing a relaxed purse lip breathing pattern.  - The less you use it, the better it will work when you need it. - Ok to use up to 2 puffs  every 4 hours if you must We are referring you to Dr Ander Slade for sleep evaluation and ok to follow up with him or me after he completes your evaluation     10/08/2020  f/u ov/Dwayne Begay re: COPD III  Still smoking  Chief Complaint  Patient presents with  . Follow-up    SOB with exertion  Dyspnea: shops leaning on cart at [employer] x 3 aisles  Cough: some rattling  Sleeping: bed is flat/ 2 pillows SABA use: avg twice daily  02: 3lpm 24/7    Covid status:  2 vaccinations and also had covid January 2022   No obvious day to day or daytime variability or assoc excess/ purulent sputum or  mucus plugs or hemoptysis or cp or chest tightness, subjective wheeze or overt sinus or hb symptoms.   Sleeping  without nocturnal  or early am exacerbation  of respiratory  c/o's or need for noct saba. Also denies any obvious fluctuation of symptoms with weather or environmental changes or other aggravating or alleviating factors except as outlined above   No unusual exposure hx or h/o childhood pna/ asthma or knowledge of premature birth.  Current Allergies, Complete Past Medical History, Past Surgical History, Family History, and Social History were reviewed in [employer] record.  ROS  The following are not active complaints unless bolded Hoarseness, sore throat, dysphagia, dental problems, itching, sneezing,  nasal congestion or discharge of excess mucus or purulent secretions, ear ache,   fever, chills, sweats, unintended wt loss or wt gain, classically pleuritic or exertional cp,  orthopnea pnd or arm/hand swelling  or leg swelling, presyncope, palpitations, abdominal pain, anorexia, nausea, vomiting, diarrhea  or change in bowel habits  or change in bladder habits, change in stools or change in urine, dysuria, hematuria,  rash, arthralgias, visual complaints, headache, numbness, weakness or ataxia or problems with walking or coordination,  change in mood or  memory.        Current Meds-   - NOTE:   Unable to verify as accurately reflecting what pt takes     Medication Sig  . acetaminophen-codeine (TYLENOL #3) 300-30 MG tablet Take 1-2 tablets by mouth every 12 (twelve) hours as needed for moderate pain.  [name] albuterol (VENTOLIN HFA) 108 (90 Base) MCG/ACT inhaler TAKE 2 PUFFS BY MOUTH EVERY 6 HOURS AS NEEDED FOR WHEEZE OR SHORTNESS OF BREATH  . apixaban (ELIQUIS) 5 MG TABS tablet Take 1 tablet (5 mg total) by mouth 2 (two) times daily.  [name] azithromycin (ZITHROMAX) 250 MG tablet Take as directed  . baclofen (LIORESAL) 10 MG tablet Take 1 tablet (10 mg total) by mouth 2 (two) times daily. As needed for muscle spasm (Patient taking differently: Take 10 mg by mouth 2 (two) times daily.)  . BEVESPI AEROSPHERE 9-4.8 MCG/ACT AERO Inhale 2 puffs into the lungs in the morning and at bedtime.  . bictegravir-emtricitabine-tenofovir AF (BIKTARVY) 50-200-25 MG TABS tablet Take 1 tablet by mouth daily.  . Blood Glucose Monitoring Suppl (ONETOUCH VERIO) w/Device KIT 1 kit by Does not apply route 2 (two) times daily.  . Budeson-Glycopyrrol-Formoterol (BREZTRI AEROSPHERE) 160-9-4.8 MCG/ACT AERO Inhale 2 puffs into the lungs 2 (two) times daily.  [name] diltiazem (TIAZAC) 240 MG 24 hr capsule Take by mouth.  . doxycycline (VIBRA-TABS) 100 MG tablet Take 1 tablet (100 mg total) by mouth 2 (two) times daily.  . fluticasone (FLONASE) 50 MCG/ACT nasal spray Place 2 sprays into both nostrils daily.  . furosemide (LASIX) 40 MG tablet Take 1 tablet (40 mg total) by mouth 2 (two) times daily.  [name] glucose blood (ONETOUCH VERIO) test strip Use as instructed. Check blood glucose level by fingerstick twice per day. E11.65  . Insulin Pen Needle (B-D UF III MINI PEN  NEEDLES) 31G X 5 MM MISC Use as instructed. Inject into the skin once daily E11.65  . lidocaine (XYLOCAINE) 2 % solution SMARTSIG:By Mouth  . losartan (COZAAR) 25 MG tablet Take 0.5 tablets (12.5 mg total) by mouth daily. Must have office visit for refills  . Misc. Devices MISC Please provide patient with insurance approved portable O2 concentrator and supplies J96.01, J44.9  . omeprazole (PRILOSEC) 40 MG capsule TAKE 1 CAPSULE (40 MG  TOTAL) BY MOUTH IN THE MORNING.  Glory Rosebush Delica Lancets 08X MISC Use as instructed. Check blood glucose level by fingerstick twice per day. E11.65  . OXYGEN Inhale 2 L/min into the lungs as needed (shortness of breath).   . OXYGEN Inhale 2 L into the lungs.  . predniSONE (DELTASONE) 10 MG tablet Take 1 tablet (10 mg total) by mouth daily with breakfast.  . predniSONE (DELTASONE) 10 MG tablet Take 4 tabs x's 2 days,2 tabs x's 2 days, 1 tab x's 2 days  . sildenafil (VIAGRA) 100 MG tablet Take 1 tablet (100 mg total) by mouth daily as needed for erectile dysfunction.  . Vitamin D, Ergocalciferol, (DRISDOL) 1.25 MG (50000 UNIT) CAPS capsule Take 1 capsule (50,000 Units total) by mouth every 7 (seven) days.                            Objective:   Physical Exam  amb obese bm nad   10/08/2020      254 12/06/2019      246  11/17/2019        257  06/10/2019    259   10/04/2018     247  01/05/2015       278 >  02/29/2016  274 > 06/17/2016 277 > 09/25/2016   258  > 12/11/2016   270  > 05/26/2017  270 > 08/05/2017  255  > 09/22/2017   262 > 03/22/2018   244 > 04/29/2018  258 > 05/17/2018  253  01/28/2016        272   05/16/14 285 lb 6.4 oz (129.457 kg)  04/20/14 282 lb 12.8 oz (128.277 kg)  03/14/14 274 lb 3.2 oz (124.376 kg)      Vital signs reviewed  10/08/2020  - Note at rest 02 sats  92% on 3lpm    General appearance:    Obese bm nad     HEENT : pt wearing mask not removed for exam due to covid - 19 concerns.    NECK :  without JVD/Nodes/TM/ nl carotid  upstrokes bilaterally   LUNGS: no acc muscle use,  Mild barrel  contour chest wall with bilateral  Distant bs s audible wheeze and  without cough on insp or exp maneuvers  and mild  Hyperresonant  to  percussion bilaterally     CV:  RRR  no s3 or murmur or increase in P2, and no edema   ABD:  Quite obese soft and nontender with pos end  insp Hoover's  in the supine position. No bruits or organomegaly appreciated, bowel sounds nl  MS:   Nl gait/  ext warm without deformities, calf tenderness, cyanosis or clubbing No obvious joint restrictions   SKIN: warm and dry without lesions    NEURO:  alert, approp, nl sensorium with  no motor or cerebellar deficits apparent.        CXR PA and Lateral:   10/08/2020 :    I personally reviewed images and agree with radiology impression as follows:    did not go for cxr as rec      Assessment & Plan:

## 2020-10-08 NOTE — Patient Instructions (Addendum)
Set up a follow up [facility] to discuss your sleep apnea  The key is to stop smoking completely before smoking completely stops you!      I very strongly recommend you get the moderna or pfizer vaccine (booster) as soon as possible based on your risk of dying from the virus  and the proven safety and benefit of these vaccines against even the delta and omicron variants.  This can save your life as well as  those of your loved ones,  especially if they are also not vaccinated.   Plan A = Automatic = Always=    Breztri Take 2 puffs first thing in am and then another 2 puffs about 12 hours later.    Work on inhaler technique:  relax and gently blow all the way out then take a nice smooth deep breath back in, triggering the inhaler at same time you start breathing in.  Hold for up to 5 seconds if you can. Blow breztri out thru nose. Rinse and gargle with water when done     Plan B = Backup (to supplement plan A, not to replace it) Only use your albuterol inhaler as a rescue medication to be used if you can't catch your breath by resting or doing a relaxed purse lip breathing pattern.  - The less you use it, the better it will work when you need it. - Ok to use the inhaler up to 2 puffs  every 4 hours if you must but call for appointment if use goes up over your usual need - Don't leave home without it !!  (think of it like the spare tire for your car)    Prednisone 10 mg take  4 each am x 2 days,   2 each am x 2 days,  1 each am x 2 days and stop     Please schedule a follow up office visit in 4 weeks, sooner if needed  with all medications /inhalers/ solutions in hand so we can verify exactly what you are taking. This includes all medications from all doctors and over the counters   Late add:  Lab closed today, did not go for cxr as rec>do both on return

## 2020-10-09 ENCOUNTER — Encounter: Payer: Self-pay | Admitting: Internal Medicine

## 2020-10-09 NOTE — Assessment & Plan Note (Signed)
Active smoker - 04/28/2013 PFT's FEV1 1.21 (39%) ratio 59 and 13% better p B2 and dlco 72 corrects to 102  - started spiriva 04/30/2013 > changed to respimat 02/28/2014 -Med calendar 03/14/2014 > not using as of 05/16/14  - arrived on stiolto / symbicort 02/29/2016 > changed to symbicort 80/spiriva (lower dose ICS due to interaction with aids meds  - PFT's  06/17/2016  FEV1 1.29 (42 % ) ratio 64  p 43 % improvement from saba p symb 80 /spiriva prior to study with DLCO  51/49c % corrects to 86  % for alv volume   - 06/17/2016   change symb to 160 2bid  - 09/25/2016  After extensive coaching device  effectiveness =    90% with dpi/ elipta > changed to Incruse per Insurance restrictions > preferred spiriva - 12/11/2016   changed to spiriva respimat  08/05/2017  After extensive coaching HFA effectiveness =  90%    - 09/22/2017 changed to symb 80 2bid with pseudowheeze and thrush  - PFT's  03/22/2018  FEV1 1.34 (45 % ) ratio 63  p 17 % improvement from saba p sym 160/ incruse prior to study with DLCO  36/41 % corrects to 72  % for alv volume   - trial of bevespi 2bid 05/17/2018 >>> improved as of 10/04/2018  - 11/17/2019  After extensive coaching inhaler device,  effectiveness =    90% >  Try breztri> bad thrush so changed back to bevespi - 10/08/2020  After extensive coaching inhaler device,  effectiveness =    75% try back on breztri as continues to req freq pred    Group D in terms of symptom/risk and laba/lama/ICS  therefore appropriate rx at this point >>>  rechallenge with breztri, use arm and hammer for mouth  Re saba: I spent extra time with pt today reviewing appropriate use of albuterol for prn use on exertion with the following points: 1) saba is for relief of sob that does not improve by walking a slower pace or resting but rather if the pt does not improve after trying this first. 2) If the pt is convinced, as many are, that saba helps recover from activity faster then it's easy to tell if this is the case  by re-challenging : ie stop, take the inhaler, then p 5 minutes try the exact same activity (intensity of workload) that just caused the symptoms and see if they are substantially diminished or not after saba 3) if there is an activity that reproducibly causes the symptoms, try the saba 15 min before the activity on alternate days   If in fact the saba really does help, then fine to continue to use it prn but advised may need to look closer at the maintenance regimen being used to achieve better control of airways disease with exertion.

## 2020-10-09 NOTE — Assessment & Plan Note (Signed)
rx Prednisone 10 mg take  4 each am x 2 days,   2 each am x 2 days,  1 each am x 2 days and stop/  4 weeks of breztri samples then return with all meds in hand using a trust but verify approach to confirm accurate Medication  Reconciliation The principal here is that until we are certain that the  patients are doing what we've asked, it makes no sense to ask them to do more.           Each maintenance medication was reviewed in detail including emphasizing most importantly the difference between maintenance and prns and under what circumstances the prns are to be triggered using an action plan format where appropriate.  Total time for H and P, chart review, counseling, reviewing hfa device(s) and generating customized AVS unique to this office visit / same day charting = 31 min

## 2020-10-09 NOTE — Assessment & Plan Note (Addendum)
Counseled re importance of smoking cessation but did not meet time criteria for separate billing    Also advised to get the 3rd pfizer vaccine as not sure he did have covid in Jan 2021.

## 2020-10-09 NOTE — Assessment & Plan Note (Signed)
HCO3 31 01/17/16 - 01/05/2015  Walked RA x 3 laps @ 185 ft each stopped due to  Leg pain and fatibue, no sob , nl pace, no desat  -  12/11/2016 Patient Saturations on Room Air at Rest = 88%----increased 98% 2lpm continuous - HC03  24    03/02/18  - 03/22/2018  Walked RA x 3 laps @ 185 ft each stopped due to  End of study, moderate pace, no   desat  / min sob - 05/17/2018  Walked 2lpm x 3 laps @ 185 ft each stopped due to  End of study, no desat   - HCO3  03/02/19  = 21  - HC03  09/22/19  = 23  - 12/06/2019   83% at rest RA pt started on 2lpm contuous o2 and sats increased to 91%-- pt walked 2 laps at average pace and sats at end 93% 2lpm   Needs to f/u with [facility] re sleep needs as can't wear cpap mask/ advised

## 2020-10-14 DIAGNOSIS — J449 Chronic obstructive pulmonary disease, unspecified: Secondary | ICD-10-CM | POA: Diagnosis not present

## 2020-10-14 DIAGNOSIS — J129 Viral pneumonia, unspecified: Secondary | ICD-10-CM | POA: Diagnosis not present

## 2020-10-25 DIAGNOSIS — Z03818 Encounter for observation for suspected exposure to other biological agents ruled out: Secondary | ICD-10-CM | POA: Diagnosis not present

## 2020-10-29 ENCOUNTER — Encounter (HOSPITAL_COMMUNITY): Payer: Self-pay

## 2020-10-29 ENCOUNTER — Other Ambulatory Visit: Payer: Self-pay

## 2020-10-29 ENCOUNTER — Emergency Department (HOSPITAL_COMMUNITY): Payer: Medicare HMO

## 2020-10-29 ENCOUNTER — Inpatient Hospital Stay (HOSPITAL_COMMUNITY)
Admission: EM | Admit: 2020-10-29 | Discharge: 2020-11-01 | DRG: 291 | Disposition: A | Discharge status: Home / Self Care | Payer: Medicare HMO | Attending: Internal Medicine | Admitting: Internal Medicine

## 2020-10-29 DIAGNOSIS — E1169 Type 2 diabetes mellitus with other specified complication: Secondary | ICD-10-CM | POA: Diagnosis present

## 2020-10-29 DIAGNOSIS — Z881 Allergy status to other antibiotic agents status: Secondary | ICD-10-CM | POA: Diagnosis not present

## 2020-10-29 DIAGNOSIS — I10 Essential (primary) hypertension: Secondary | ICD-10-CM | POA: Diagnosis present

## 2020-10-29 DIAGNOSIS — E875 Hyperkalemia: Secondary | ICD-10-CM | POA: Diagnosis not present

## 2020-10-29 DIAGNOSIS — Z7984 Long term (current) use of oral hypoglycemic drugs: Secondary | ICD-10-CM

## 2020-10-29 DIAGNOSIS — Z7901 Long term (current) use of anticoagulants: Secondary | ICD-10-CM | POA: Diagnosis not present

## 2020-10-29 DIAGNOSIS — I130 Hypertensive heart and chronic kidney disease with heart failure and stage 1 through stage 4 chronic kidney disease, or unspecified chronic kidney disease: Principal | ICD-10-CM | POA: Diagnosis present

## 2020-10-29 DIAGNOSIS — D72829 Elevated white blood cell count, unspecified: Secondary | ICD-10-CM | POA: Diagnosis present

## 2020-10-29 DIAGNOSIS — Z743 Need for continuous supervision: Secondary | ICD-10-CM | POA: Diagnosis not present

## 2020-10-29 DIAGNOSIS — R0689 Other abnormalities of breathing: Secondary | ICD-10-CM | POA: Diagnosis not present

## 2020-10-29 DIAGNOSIS — I4891 Unspecified atrial fibrillation: Secondary | ICD-10-CM | POA: Diagnosis not present

## 2020-10-29 DIAGNOSIS — Z9981 Dependence on supplemental oxygen: Secondary | ICD-10-CM

## 2020-10-29 DIAGNOSIS — J9621 Acute and chronic respiratory failure with hypoxia: Secondary | ICD-10-CM | POA: Diagnosis present

## 2020-10-29 DIAGNOSIS — R0603 Acute respiratory distress: Secondary | ICD-10-CM | POA: Diagnosis not present

## 2020-10-29 DIAGNOSIS — R062 Wheezing: Secondary | ICD-10-CM | POA: Diagnosis not present

## 2020-10-29 DIAGNOSIS — Z833 Family history of diabetes mellitus: Secondary | ICD-10-CM

## 2020-10-29 DIAGNOSIS — R000 Tachycardia, unspecified: Secondary | ICD-10-CM | POA: Diagnosis not present

## 2020-10-29 DIAGNOSIS — K219 Gastro-esophageal reflux disease without esophagitis: Secondary | ICD-10-CM | POA: Diagnosis present

## 2020-10-29 DIAGNOSIS — I5033 Acute on chronic diastolic (congestive) heart failure: Secondary | ICD-10-CM | POA: Diagnosis not present

## 2020-10-29 DIAGNOSIS — F17210 Nicotine dependence, cigarettes, uncomplicated: Secondary | ICD-10-CM | POA: Diagnosis present

## 2020-10-29 DIAGNOSIS — Z8249 Family history of ischemic heart disease and other diseases of the circulatory system: Secondary | ICD-10-CM | POA: Diagnosis not present

## 2020-10-29 DIAGNOSIS — Z825 Family history of asthma and other chronic lower respiratory diseases: Secondary | ICD-10-CM

## 2020-10-29 DIAGNOSIS — G8929 Other chronic pain: Secondary | ICD-10-CM | POA: Diagnosis present

## 2020-10-29 DIAGNOSIS — E1122 Type 2 diabetes mellitus with diabetic chronic kidney disease: Secondary | ICD-10-CM | POA: Diagnosis present

## 2020-10-29 DIAGNOSIS — Z794 Long term (current) use of insulin: Secondary | ICD-10-CM

## 2020-10-29 DIAGNOSIS — Z7951 Long term (current) use of inhaled steroids: Secondary | ICD-10-CM | POA: Diagnosis not present

## 2020-10-29 DIAGNOSIS — I480 Paroxysmal atrial fibrillation: Secondary | ICD-10-CM | POA: Diagnosis present

## 2020-10-29 DIAGNOSIS — F32A Depression, unspecified: Secondary | ICD-10-CM | POA: Diagnosis present

## 2020-10-29 DIAGNOSIS — J441 Chronic obstructive pulmonary disease with (acute) exacerbation: Secondary | ICD-10-CM | POA: Diagnosis not present

## 2020-10-29 DIAGNOSIS — J439 Emphysema, unspecified: Secondary | ICD-10-CM | POA: Diagnosis present

## 2020-10-29 DIAGNOSIS — Z79899 Other long term (current) drug therapy: Secondary | ICD-10-CM | POA: Diagnosis not present

## 2020-10-29 DIAGNOSIS — Z20822 Contact with and (suspected) exposure to covid-19: Secondary | ICD-10-CM | POA: Diagnosis present

## 2020-10-29 DIAGNOSIS — N179 Acute kidney failure, unspecified: Secondary | ICD-10-CM | POA: Diagnosis not present

## 2020-10-29 DIAGNOSIS — Z21 Asymptomatic human immunodeficiency virus [HIV] infection status: Secondary | ICD-10-CM | POA: Diagnosis present

## 2020-10-29 DIAGNOSIS — J9601 Acute respiratory failure with hypoxia: Secondary | ICD-10-CM | POA: Diagnosis not present

## 2020-10-29 DIAGNOSIS — B20 Human immunodeficiency virus [HIV] disease: Secondary | ICD-10-CM | POA: Diagnosis present

## 2020-10-29 DIAGNOSIS — R69 Illness, unspecified: Secondary | ICD-10-CM | POA: Diagnosis not present

## 2020-10-29 DIAGNOSIS — E669 Obesity, unspecified: Secondary | ICD-10-CM | POA: Diagnosis not present

## 2020-10-29 DIAGNOSIS — M545 Low back pain, unspecified: Secondary | ICD-10-CM | POA: Diagnosis present

## 2020-10-29 DIAGNOSIS — I517 Cardiomegaly: Secondary | ICD-10-CM | POA: Diagnosis not present

## 2020-10-29 DIAGNOSIS — R0602 Shortness of breath: Secondary | ICD-10-CM | POA: Diagnosis not present

## 2020-10-29 DIAGNOSIS — Z6836 Body mass index (BMI) 36.0-36.9, adult: Secondary | ICD-10-CM

## 2020-10-29 DIAGNOSIS — N182 Chronic kidney disease, stage 2 (mild): Secondary | ICD-10-CM | POA: Diagnosis present

## 2020-10-29 DIAGNOSIS — J449 Chronic obstructive pulmonary disease, unspecified: Secondary | ICD-10-CM

## 2020-10-29 LAB — CBC WITH DIFFERENTIAL/PLATELET
Abs Immature Granulocytes: 0.02 10*3/uL (ref 0.00–0.07)
Basophils Absolute: 0 10*3/uL (ref 0.0–0.1)
Basophils Relative: 1 %
Eosinophils Absolute: 0.1 10*3/uL (ref 0.0–0.5)
Eosinophils Relative: 1 %
HCT: 45.2 % (ref 39.0–52.0)
Hemoglobin: 13.8 g/dL (ref 13.0–17.0)
Immature Granulocytes: 0 %
Lymphocytes Relative: 35 %
Lymphs Abs: 2.9 10*3/uL (ref 0.7–4.0)
MCH: 29.4 pg (ref 26.0–34.0)
MCHC: 30.5 g/dL (ref 30.0–36.0)
MCV: 96.4 fL (ref 80.0–100.0)
Monocytes Absolute: 1.1 10*3/uL — ABNORMAL HIGH (ref 0.1–1.0)
Monocytes Relative: 14 %
Neutro Abs: 4.2 10*3/uL (ref 1.7–7.7)
Neutrophils Relative %: 49 %
Platelets: 230 10*3/uL (ref 150–400)
RBC: 4.69 MIL/uL (ref 4.22–5.81)
RDW: 12.9 % (ref 11.5–15.5)
WBC: 8.4 10*3/uL (ref 4.0–10.5)
nRBC: 0 % (ref 0.0–0.2)

## 2020-10-29 LAB — SARS CORONAVIRUS 2 (TAT 6-24 HRS): SARS Coronavirus 2: NEGATIVE

## 2020-10-29 LAB — COMPREHENSIVE METABOLIC PANEL
ALT: 31 U/L (ref 0–44)
AST: 36 U/L (ref 15–41)
Albumin: 3.4 g/dL — ABNORMAL LOW (ref 3.5–5.0)
Alkaline Phosphatase: 45 U/L (ref 38–126)
Anion gap: 7 (ref 5–15)
BUN: 15 mg/dL (ref 6–20)
CO2: 28 mmol/L (ref 22–32)
Calcium: 9 mg/dL (ref 8.9–10.3)
Chloride: 102 mmol/L (ref 98–111)
Creatinine, Ser: 1.25 mg/dL — ABNORMAL HIGH (ref 0.61–1.24)
GFR, Estimated: >60 mL/min (ref >60)
Glucose, Bld: 154 mg/dL — ABNORMAL HIGH (ref 70–99)
Potassium: 4.5 mmol/L (ref 3.5–5.1)
Sodium: 137 mmol/L (ref 135–145)
Total Bilirubin: 0.8 mg/dL (ref 0.3–1.2)
Total Protein: 8.2 g/dL — ABNORMAL HIGH (ref 6.5–8.1)

## 2020-10-29 LAB — URINALYSIS, ROUTINE W REFLEX MICROSCOPIC
Bacteria, UA: NONE SEEN
Bilirubin Urine: NEGATIVE
Glucose, UA: NEGATIVE mg/dL
Ketones, ur: NEGATIVE mg/dL
Leukocytes,Ua: NEGATIVE
Nitrite: NEGATIVE
Protein, ur: 100 mg/dL — AB
Specific Gravity, Urine: 1.014 (ref 1.005–1.030)
pH: 5 (ref 5.0–8.0)

## 2020-10-29 LAB — CBG MONITORING, ED
Glucose-Capillary: 283 mg/dL — ABNORMAL HIGH (ref 70–99)
Glucose-Capillary: 336 mg/dL — ABNORMAL HIGH (ref 70–99)
Glucose-Capillary: 246 mg/dL — ABNORMAL HIGH (ref 70–99)

## 2020-10-29 LAB — D-DIMER, QUANTITATIVE: D-Dimer, Quant: 0.27 ug/mL-FEU (ref 0.00–0.50)

## 2020-10-29 LAB — BRAIN NATRIURETIC PEPTIDE: B Natriuretic Peptide: 102.9 pg/mL — ABNORMAL HIGH (ref 0.0–100.0)

## 2020-10-29 MED ORDER — SODIUM CHLORIDE 0.9 % IV SOLN
500.0000 mg | Freq: Once | INTRAVENOUS | Status: AC
  Administered 2020-10-29: 500 mg via INTRAVENOUS
  Filled 2020-10-29: qty 500

## 2020-10-29 MED ORDER — INSULIN GLARGINE 100 UNIT/ML ~~LOC~~ SOLN
10.0000 [IU] | Freq: Every day | SUBCUTANEOUS | Status: DC
  Administered 2020-10-29 – 2020-10-30 (×2): 10 [IU] via SUBCUTANEOUS
  Filled 2020-10-29 (×4): qty 0.1

## 2020-10-29 MED ORDER — FUROSEMIDE 20 MG PO TABS: 40.0000 mg | ORAL_TABLET | Freq: Two times a day (BID) | ORAL | Status: DC

## 2020-10-29 MED ORDER — BUDESON-GLYCOPYRROL-FORMOTEROL 160-9-4.8 MCG/ACT IN AERO: 2.0000 | INHALATION_SPRAY | Freq: Two times a day (BID) | RESPIRATORY_TRACT | Status: DC

## 2020-10-29 MED ORDER — MORPHINE SULFATE (PF) 2 MG/ML IV SOLN: 2.0000 mg | INTRAVENOUS | Status: DC | PRN

## 2020-10-29 MED ORDER — HYDROCODONE-ACETAMINOPHEN 5-325 MG PO TABS
1.0000 | ORAL_TABLET | ORAL | Status: DC | PRN
Start: 2020-10-29 — End: 2020-11-01
  Administered 2020-10-29 – 2020-10-31 (×6): 1 via ORAL
  Filled 2020-10-29 (×6): qty 1

## 2020-10-29 MED ORDER — ACETAMINOPHEN 650 MG RE SUPP: 650.0000 mg | Freq: Four times a day (QID) | RECTAL | Status: DC | PRN

## 2020-10-29 MED ORDER — PANTOPRAZOLE SODIUM 40 MG PO TBEC
40.0000 mg | DELAYED_RELEASE_TABLET | Freq: Every day | ORAL | Status: DC
  Administered 2020-10-30 – 2020-11-01 (×3): 40 mg via ORAL
  Filled 2020-10-29 (×4): qty 1

## 2020-10-29 MED ORDER — HYDROMORPHONE HCL 1 MG/ML IJ SOLN
1.0000 mg | Freq: Once | INTRAMUSCULAR | Status: AC
  Administered 2020-10-29: 1 mg via INTRAVENOUS
  Filled 2020-10-29: qty 1

## 2020-10-29 MED ORDER — BICTEGRAVIR-EMTRICITAB-TENOFOV 50-200-25 MG PO TABS
1.0000 | ORAL_TABLET | Freq: Every day | ORAL | Status: DC
  Administered 2020-10-31 – 2020-11-01 (×2): 1 via ORAL
  Filled 2020-10-29 (×4): qty 1

## 2020-10-29 MED ORDER — POLYETHYLENE GLYCOL 3350 17 G PO PACK: 17.0000 g | PACK | Freq: Every day | ORAL | Status: DC | PRN

## 2020-10-29 MED ORDER — INSULIN ASPART 100 UNIT/ML ~~LOC~~ SOLN
0.0000 [IU] | Freq: Three times a day (TID) | SUBCUTANEOUS | Status: DC
  Administered 2020-10-29: 10 [IU] via SUBCUTANEOUS
  Administered 2020-10-30: 7 [IU] via SUBCUTANEOUS
  Administered 2020-10-30: 11 [IU] via SUBCUTANEOUS
  Administered 2020-10-30: 7 [IU] via SUBCUTANEOUS
  Administered 2020-10-31: 15 [IU] via SUBCUTANEOUS
  Administered 2020-10-31: 11 [IU] via SUBCUTANEOUS
  Administered 2020-10-31: 4 [IU] via SUBCUTANEOUS
  Administered 2020-11-01 (×2): 15 [IU] via SUBCUTANEOUS

## 2020-10-29 MED ORDER — ALBUTEROL SULFATE (2.5 MG/3ML) 0.083% IN NEBU: 2.5000 mg | INHALATION_SOLUTION | RESPIRATORY_TRACT | Status: DC | PRN

## 2020-10-29 MED ORDER — ACETAMINOPHEN 325 MG PO TABS: 650.0000 mg | ORAL_TABLET | Freq: Four times a day (QID) | ORAL | Status: DC | PRN

## 2020-10-29 MED ORDER — ALBUTEROL (5 MG/ML) CONTINUOUS INHALATION SOLN
10.0000 mg/h | INHALATION_SOLUTION | Freq: Once | RESPIRATORY_TRACT | Status: AC
  Administered 2020-10-29: 10 mg/h via RESPIRATORY_TRACT
  Filled 2020-10-29: qty 20

## 2020-10-29 MED ORDER — FLUTICASONE FUROATE-VILANTEROL 100-25 MCG/INH IN AEPB
1.0000 | INHALATION_SPRAY | Freq: Every day | RESPIRATORY_TRACT | Status: DC
  Filled 2020-10-29: qty 28

## 2020-10-29 MED ORDER — INSULIN ASPART 100 UNIT/ML ~~LOC~~ SOLN
0.0000 [IU] | Freq: Every day | SUBCUTANEOUS | Status: DC
  Administered 2020-10-29: 2 [IU] via SUBCUTANEOUS
  Administered 2020-10-30 – 2020-10-31 (×2): 5 [IU] via SUBCUTANEOUS

## 2020-10-29 MED ORDER — APIXABAN 5 MG PO TABS
5.0000 mg | ORAL_TABLET | Freq: Two times a day (BID) | ORAL | Status: DC
  Administered 2020-10-29 – 2020-11-01 (×6): 5 mg via ORAL
  Filled 2020-10-29 (×7): qty 1

## 2020-10-29 MED ORDER — PREDNISONE 20 MG PO TABS: 40.0000 mg | ORAL_TABLET | Freq: Every day | ORAL | Status: DC

## 2020-10-29 MED ORDER — FUROSEMIDE 10 MG/ML IJ SOLN
40.0000 mg | Freq: Once | INTRAMUSCULAR | Status: AC
  Administered 2020-10-29: 40 mg via INTRAVENOUS
  Filled 2020-10-29: qty 4

## 2020-10-29 MED ORDER — INSULIN GLARGINE 100 UNIT/ML ~~LOC~~ SOLN
10.0000 [IU] | Freq: Every day | SUBCUTANEOUS | Status: DC
  Filled 2020-10-29: qty 0.1

## 2020-10-29 MED ORDER — SODIUM CHLORIDE 0.9 % IV SOLN
500.0000 mg | INTRAVENOUS | Status: DC
  Administered 2020-10-30 – 2020-10-31 (×2): 500 mg via INTRAVENOUS
  Filled 2020-10-29 (×3): qty 500

## 2020-10-29 MED ORDER — METOPROLOL TARTRATE 5 MG/5ML IV SOLN
5.0000 mg | Freq: Four times a day (QID) | INTRAVENOUS | Status: DC | PRN
  Filled 2020-10-29: qty 5

## 2020-10-29 MED ORDER — IPRATROPIUM-ALBUTEROL 0.5-2.5 (3) MG/3ML IN SOLN
3.0000 mL | Freq: Four times a day (QID) | RESPIRATORY_TRACT | Status: DC
  Administered 2020-10-29 – 2020-11-01 (×12): 3 mL via RESPIRATORY_TRACT
  Filled 2020-10-29 (×14): qty 3

## 2020-10-29 MED ORDER — UMECLIDINIUM BROMIDE 62.5 MCG/INH IN AEPB
1.0000 | INHALATION_SPRAY | Freq: Every day | RESPIRATORY_TRACT | Status: DC
  Administered 2020-10-31 – 2020-11-01 (×2): 1 via RESPIRATORY_TRACT
  Filled 2020-10-29 (×2): qty 7

## 2020-10-29 MED ORDER — INSULIN ASPART 100 UNIT/ML ~~LOC~~ SOLN
4.0000 [IU] | Freq: Three times a day (TID) | SUBCUTANEOUS | Status: DC
  Administered 2020-10-29 – 2020-10-31 (×6): 4 [IU] via SUBCUTANEOUS

## 2020-10-29 MED ORDER — METHYLPREDNISOLONE SODIUM SUCC 125 MG IJ SOLR
60.0000 mg | Freq: Four times a day (QID) | INTRAMUSCULAR | Status: DC
  Administered 2020-10-29 – 2020-11-01 (×11): 60 mg via INTRAVENOUS
  Filled 2020-10-29 (×13): qty 2

## 2020-10-29 MED ORDER — DILTIAZEM HCL ER COATED BEADS 240 MG PO CP24
240.0000 mg | ORAL_CAPSULE | Freq: Every day | ORAL | Status: DC
  Filled 2020-10-29: qty 1

## 2020-10-29 NOTE — ED Notes (Signed)
Pt is aware we need a urine sample. 
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
## 2020-10-29 NOTE — ED Triage Notes (Addendum)
Pt bib [facility] from home with complaint of shob, cough and congestion x 4 days and worsened today. Hx of COPD and pneumonia. Pt wears 2L [place] at home and increased it to 4L today. 2 duo nebs and 125 mg solumedrol given with ems. Denies chest pain at this time

## 2020-10-30 ENCOUNTER — Encounter (HOSPITAL_COMMUNITY): Payer: Self-pay | Admitting: Internal Medicine

## 2020-10-30 LAB — CBC
HCT: 44.4 % (ref 39.0–52.0)
Hemoglobin: 13.2 g/dL (ref 13.0–17.0)
MCH: 29.5 pg (ref 26.0–34.0)
MCHC: 29.7 g/dL — ABNORMAL LOW (ref 30.0–36.0)
MCV: 99.3 fL (ref 80.0–100.0)
Platelets: 243 10*3/uL (ref 150–400)
RBC: 4.47 MIL/uL (ref 4.22–5.81)
RDW: 12.7 % (ref 11.5–15.5)
WBC: 10.3 10*3/uL (ref 4.0–10.5)
nRBC: 0 % (ref 0.0–0.2)

## 2020-10-30 LAB — GLUCOSE, CAPILLARY
Glucose-Capillary: 248 mg/dL — ABNORMAL HIGH (ref 70–99)
Glucose-Capillary: 280 mg/dL — ABNORMAL HIGH (ref 70–99)
Glucose-Capillary: 357 mg/dL — ABNORMAL HIGH (ref 70–99)

## 2020-10-30 LAB — POTASSIUM: Potassium: 5 mmol/L (ref 3.5–5.1)

## 2020-10-30 LAB — BASIC METABOLIC PANEL
Anion gap: 10 (ref 5–15)
BUN: 28 mg/dL — ABNORMAL HIGH (ref 6–20)
CO2: 25 mmol/L (ref 22–32)
Calcium: 9.1 mg/dL (ref 8.9–10.3)
Chloride: 100 mmol/L (ref 98–111)
Creatinine, Ser: 1.72 mg/dL — ABNORMAL HIGH (ref 0.61–1.24)
GFR, Estimated: 47 mL/min — ABNORMAL LOW (ref >60)
Glucose, Bld: 181 mg/dL — ABNORMAL HIGH (ref 70–99)
Potassium: 5.8 mmol/L — ABNORMAL HIGH (ref 3.5–5.1)
Sodium: 135 mmol/L (ref 135–145)

## 2020-10-30 LAB — HEMOGLOBIN A1C
Hgb A1c MFr Bld: 7.3 % — ABNORMAL HIGH (ref 4.8–5.6)
Mean Plasma Glucose: 162.81 mg/dL

## 2020-10-30 LAB — CBG MONITORING, ED: Glucose-Capillary: 215 mg/dL — ABNORMAL HIGH (ref 70–99)

## 2020-10-30 MED ORDER — ARFORMOTEROL TARTRATE 15 MCG/2ML IN NEBU
15.0000 ug | INHALATION_SOLUTION | Freq: Two times a day (BID) | RESPIRATORY_TRACT | Status: DC
  Administered 2020-10-30 – 2020-11-01 (×5): 15 ug via RESPIRATORY_TRACT
  Filled 2020-10-30 (×5): qty 2

## 2020-10-30 MED ORDER — SODIUM ZIRCONIUM CYCLOSILICATE 10 G PO PACK
10.0000 g | PACK | ORAL | Status: AC
  Administered 2020-10-30: 10 g via ORAL
  Filled 2020-10-30: qty 1

## 2020-10-30 MED ORDER — SODIUM ZIRCONIUM CYCLOSILICATE 10 G PO PACK
10.0000 g | PACK | ORAL | Status: DC
  Filled 2020-10-30: qty 1

## 2020-10-30 MED ORDER — BUDESONIDE 0.25 MG/2ML IN SUSP
0.2500 mg | Freq: Two times a day (BID) | RESPIRATORY_TRACT | Status: DC
  Administered 2020-10-30 – 2020-11-01 (×5): 0.25 mg via RESPIRATORY_TRACT
  Filled 2020-10-30 (×4): qty 2

## 2020-10-30 MED ORDER — DILTIAZEM HCL ER COATED BEADS 240 MG PO CP24
240.0000 mg | ORAL_CAPSULE | Freq: Every day | ORAL | Status: DC
  Administered 2020-10-30 – 2020-11-01 (×3): 240 mg via ORAL
  Filled 2020-10-30 (×4): qty 1

## 2020-10-30 MED ORDER — DULOXETINE HCL 60 MG PO CPEP
60.0000 mg | ORAL_CAPSULE | Freq: Every day | ORAL | Status: DC
  Administered 2020-10-30 – 2020-11-01 (×3): 60 mg via ORAL
  Filled 2020-10-30 (×3): qty 2

## 2020-10-30 MED ORDER — ORAL CARE MOUTH RINSE
15.0000 mL | Freq: Two times a day (BID) | OROMUCOSAL | Status: DC
  Administered 2020-10-30 – 2020-11-01 (×5): 15 mL via OROMUCOSAL

## 2020-10-30 NOTE — Progress Notes (Signed)
Inpatient Diabetes Program Recommendations  AACE/[name]: New Consensus Statement on Inpatient Glycemic Control (2015)  Target Ranges:  Prepandial:   less than 140 mg/dL      Peak postprandial:   less than 180 mg/dL (1-2 hours)      Critically ill patients:  140 - 180 mg/dL   Results for Chris [name], Chris [name] (MRN [id number]) as of 10/30/2020 09:39  Ref. Range 10/29/2020 18:35 10/29/2020 21:41 10/29/2020 23:19 10/30/2020 07:58  Glucose-Capillary Latest Ref Range: 70 - 99 mg/dL 950 (H)  14 units NOVOLOG @7 :29pm 336 (H) 246 (H)  2 units NOVOLOG  10 units LANTUS 215 (H)   Results for Chris [name], Chris [name] (MRN [name]) as of 10/30/2020 09:39  Ref. Range 10/30/2020 02:25  Hemoglobin A1C Latest Ref Range: 4.8 - 5.6 % 7.3 (H)    Home DM Meds: Metformin 1000 mg BID       Victoza 1.8 mg Daily   Current Orders: Lantus 10 units QHS       Novolog 0-20 units ac/hs      Novolog 4 units TID with meals     Getting Solumedrol 60 mg Q6H.    Novolog SSI and Meal Coverage both started yest at 7pm--Lantus started yest at 11pm.  MD- Note CBG 215 this AM  Please consider increasing Lantus to 15 units QHS     --Will follow patient during hospitalization--  11/01/2020 RN, MSN, CDE Diabetes Coordinator Inpatient Glycemic Control Team Team Pager: [phone] (8a-5p)

## 2020-10-30 NOTE — Progress Notes (Signed)
PROGRESS NOTE    Chris [name].  FOY:[id number] DOB: 23-Jul-1966 DOA: 10/29/2020 PCP: Claiborne Rigg, NP    Brief Narrative:  55 year old male with oxygen dependent COPD, diabetes, hypertension, diastolic heart failure, atrial fibrillation, presents to the hospital with progressive shortness of breath.  X-ray did not show any evidence of pneumonia.  He was noted to be wheezing and was felt to have COPD exacerbation.  Admitted for further treatments with steroids, bronchodilators and antibiotics.   Assessment & Plan:   Principal Problem:   Acute and chronic respiratory failure with hypoxia  Active Problems:   Cigarette smoker   Essential hypertension, benign   COPD with acute exacerbation (HCC)   Diabetes mellitus type 2 in obese (HCC)   HIV (human immunodeficiency virus infection) (HCC)   Atrial fibrillation with RVR (HCC)   Acute on chronic respiratory failure with hypoxia -At baseline, patient wears 2 L of oxygen -Currently requiring 5 L -Likely related to COPD exacerbation -D-dimer is negative -Continue to wean down oxygen as tolerated  COPD exacerbation -Continue on intravenous steroids since he is still wheezing and short of breath -Continue inhaled steroids, LABA, SABA -Continue on azithromycin  Atrial fibrillation -Anticoagulated with Eliquis -Continue on Cardizem -Repeat EKG to monitor heart rate  HIV -Chronically on Biktarvy -Reports compliance, follow-up on CD4 count  Acute kidney injury -Bump in creatinine to 1.7, possibly related to Lasix he received yesterday -Continue to monitor for now -Holding losartan  Hyperkalemia -We will give a dose of Lokelma and monitor  Diabetes -Holding home regimen of Metformin and Victoza -Started on basal/bolus dosing and sliding scale -Blood sugars currently stable  Chronic diastolic congestive heart failure -Appears compensated -Hold Lasix for now in light of elevated creatinine   DVT prophylaxis:   apixaban (ELIQUIS) tablet 5 mg  Code Status: Full code Family Communication: Discussed with patient Disposition Plan: Status is: Inpatient  Remains inpatient appropriate because:Inpatient level of care appropriate due to severity of illness   Dispo: The patient is from: Home              Anticipated d/c is to: Home              Patient currently is not medically stable to d/c.   Difficult to place patient No  Consultants:     Procedures:     Antimicrobials:   Azithromycin 3/21 >   Subjective: Says that breathing is mildly better today, but not back to baseline.  Continues to have wheezing  Objective: Vitals:   10/30/20 0330 10/30/20 0500 10/30/20 0600 10/30/20 0658  BP: (!) 143/84 (!) 161/100 120/63 120/63  Pulse: (!) 106 (!) 106 (!) 113 (!) 102  Resp: (!) 23 (!) 23 20 16   Temp:      TempSrc:      SpO2: 93% 96% 92% 97%  Weight:      Height:        Intake/Output Summary (Last 24 hours) at 10/30/2020 0836 Last data filed at 10/30/2020 0600 Gross per 24 hour  Intake 250 ml  Output 500 ml  Net -250 ml   Filed Weights   10/29/20 1247  Weight: 113.4 kg    Examination:  General exam: Appears calm and comfortable  Respiratory system: Bilateral wheezes. Respiratory effort normal. Cardiovascular system: S1 & S2 heard, irregular. No JVD, murmurs, rubs, gallops or clicks. No pedal edema. Gastrointestinal system: Abdomen is nondistended, soft and nontender. No organomegaly or masses felt. Normal bowel sounds heard. Central nervous system:  Alert and oriented. No focal neurological deficits. Extremities: Symmetric 5 x 5 power. Skin: No rashes, lesions or ulcers Psychiatry: Judgement and insight appear normal. Mood & affect appropriate.     Data Reviewed: I have personally reviewed following labs and imaging studies  CBC: Recent Labs  Lab 10/29/20 1214 10/30/20 0225  WBC 8.4 10.3  NEUTROABS 4.2  --   HGB 13.8 13.2  HCT 45.2 44.4  MCV 96.4 99.3  PLT 230  243   Basic Metabolic Panel: Recent Labs  Lab 10/29/20 1214 10/30/20 0225  NA 137 135  K 4.5 5.8*  CL 102 100  CO2 28 25  GLUCOSE 154* 181*  BUN 15 28*  CREATININE 1.25* 1.72*  CALCIUM 9.0 9.1   GFR: Estimated Creatinine Clearance: 61 mL/min (A) (by C-G formula based on SCr of 1.72 mg/dL (H)). Liver Function Tests: Recent Labs  Lab 10/29/20 1214  AST 36  ALT 31  ALKPHOS 45  BILITOT 0.8  PROT 8.2*  ALBUMIN 3.4*   No results for input(s): LIPASE, AMYLASE in the last 168 hours. No results for input(s): AMMONIA in the last 168 hours. Coagulation Profile: No results for input(s): INR, PROTIME in the last 168 hours. Cardiac Enzymes: No results for input(s): CKTOTAL, CKMB, CKMBINDEX, TROPONINI in the last 168 hours. BNP (last 3 results) No results for input(s): PROBNP in the last 8760 hours. HbA1C: Recent Labs    10/30/20 0225  HGBA1C 7.3*   CBG: Recent Labs  Lab 10/29/20 1835 10/29/20 2141 10/29/20 2319 10/30/20 0758  GLUCAP 283* 336* 246* 215*   Lipid Profile: No results for input(s): CHOL, HDL, LDLCALC, TRIG, CHOLHDL, LDLDIRECT in the last 72 hours. Thyroid Function Tests: No results for input(s): TSH, T4TOTAL, FREET4, T3FREE, THYROIDAB in the last 72 hours. Anemia Panel: No results for input(s): VITAMINB12, FOLATE, FERRITIN, TIBC, IRON, RETICCTPCT in the last 72 hours. Sepsis Labs: No results for input(s): PROCALCITON, LATICACIDVEN in the last 168 hours.  Recent Results (from the past 240 hour(s))  SARS CORONAVIRUS 2 (TAT 6-24 HRS) Nasopharyngeal Nasopharyngeal Swab     Status: None   Collection Time: 10/29/20 12:08 PM   Specimen: Nasopharyngeal Swab  Result Value Ref Range Status   SARS Coronavirus 2 NEGATIVE NEGATIVE Final    Comment: (NOTE) SARS-CoV-2 target nucleic acids are NOT DETECTED.  The SARS-CoV-2 RNA is generally detectable in upper and lower respiratory specimens during the acute phase of infection. Negative results do not preclude  SARS-CoV-2 infection, do not rule out co-infections with other pathogens, and should not be used as the sole basis for treatment or other patient management decisions. Negative results must be combined with clinical observations, patient history, and epidemiological information. The expected result is Negative.  Fact Sheet for Patients: HairSlick.no  Fact Sheet for Healthcare Providers: quierodirigir.com  This test is not yet approved or cleared by the [place] FDA and  has been authorized for detection and/or diagnosis of SARS-CoV-2 by FDA under an Emergency Use Authorization (EUA). This EUA will remain  in effect (meaning this test can be used) for the duration of the COVID-19 declaration under Se ction 564(b)(1) of the Act, 21 U.S.C. section 360bbb-3(b)(1), unless the authorization is terminated or revoked sooner.  Performed at [facility], [address] [place], [place] [address]          Radiology Studies: DG Chest Port 1 View  Result Date: 10/29/2020 CLINICAL DATA:  Shortness of breath. EXAM: PORTABLE CHEST 1 VIEW COMPARISON:  April 30, 2020. FINDINGS: Stable cardiomegaly. No pneumothorax or pleural effusion is noted. Both lungs are clear. The visualized skeletal structures are unremarkable. IMPRESSION: No active disease. Electronically Signed   By: Lupita Raider M.D.   On: 10/29/2020 12:55        Scheduled Meds: . apixaban  5 mg Oral BID  . arformoterol  15 mcg Nebulization BID  . bictegravir-emtricitabine-tenofovir AF  1 tablet Oral Daily  . budesonide (PULMICORT) nebulizer solution  0.25 mg Nebulization BID  . diltiazem  240 mg Oral Daily  . insulin aspart  0-20 Units Subcutaneous TID WC  . insulin aspart  0-5 Units Subcutaneous QHS  . insulin aspart  4 Units Subcutaneous TID WC  . insulin glargine  10 Units Subcutaneous QHS  . ipratropium-albuterol  3 mL Nebulization Q6H  .  methylPREDNISolone (SOLU-MEDROL) injection  60 mg Intravenous Q6H  . pantoprazole  40 mg Oral Daily  . sodium zirconium cyclosilicate  10 g Oral NOW  . umeclidinium bromide  1 puff Inhalation Daily   Continuous Infusions: . azithromycin       LOS: 1 day    Time spent:    Erick Blinks, MD [facility]   If 7PM-7AM, please contact night-coverage [email or web address]  10/30/2020, 8:36 AM

## 2020-10-31 ENCOUNTER — Encounter (HOSPITAL_COMMUNITY): Payer: Self-pay | Admitting: Internal Medicine

## 2020-10-31 LAB — CBC WITH DIFFERENTIAL/PLATELET
Abs Immature Granulocytes: 0.1 10*3/uL — ABNORMAL HIGH (ref 0.00–0.07)
Basophils Absolute: 0 10*3/uL (ref 0.0–0.1)
Basophils Relative: 0 %
Eosinophils Absolute: 0 10*3/uL (ref 0.0–0.5)
Eosinophils Relative: 0 %
HCT: 43.9 % (ref 39.0–52.0)
Hemoglobin: 13.3 g/dL (ref 13.0–17.0)
Immature Granulocytes: 1 %
Lymphocytes Relative: 4 %
Lymphs Abs: 0.7 10*3/uL (ref 0.7–4.0)
MCH: 29.4 pg (ref 26.0–34.0)
MCHC: 30.3 g/dL (ref 30.0–36.0)
MCV: 97.1 fL (ref 80.0–100.0)
Monocytes Absolute: 0.7 10*3/uL (ref 0.1–1.0)
Monocytes Relative: 3 %
Neutro Abs: 17.4 10*3/uL — ABNORMAL HIGH (ref 1.7–7.7)
Neutrophils Relative %: 92 %
Platelets: 263 10*3/uL (ref 150–400)
RBC: 4.52 MIL/uL (ref 4.22–5.81)
RDW: 12.6 % (ref 11.5–15.5)
WBC: 18.9 10*3/uL — ABNORMAL HIGH (ref 4.0–10.5)
nRBC: 0.1 % (ref 0.0–0.2)

## 2020-10-31 LAB — GLUCOSE, CAPILLARY
Glucose-Capillary: 302 mg/dL — ABNORMAL HIGH (ref 70–99)
Glucose-Capillary: 254 mg/dL — ABNORMAL HIGH (ref 70–99)
Glucose-Capillary: 176 mg/dL — ABNORMAL HIGH (ref 70–99)
Glucose-Capillary: 388 mg/dL — ABNORMAL HIGH (ref 70–99)
Glucose-Capillary: 409 mg/dL — ABNORMAL HIGH (ref 70–99)

## 2020-10-31 LAB — CD4/CD8 (T-HELPER/T-SUPPRESSOR CELL)
CD4 absolute: 83 /uL — ABNORMAL LOW (ref 400–1790)
CD4%: 10 % — ABNORMAL LOW (ref 33–65)
CD8 T Cell Abs: 514 /uL (ref 190–1000)
CD8tox: 65 % — ABNORMAL HIGH (ref 12–40)
Ratio: 0.16 — ABNORMAL LOW (ref 1.0–3.0)
Total lymphocyte count: 796 /uL — ABNORMAL LOW (ref 1000–4000)

## 2020-10-31 LAB — BASIC METABOLIC PANEL
Anion gap: 8 (ref 5–15)
BUN: 42 mg/dL — ABNORMAL HIGH (ref 6–20)
CO2: 25 mmol/L (ref 22–32)
Calcium: 9.1 mg/dL (ref 8.9–10.3)
Chloride: 101 mmol/L (ref 98–111)
Creatinine, Ser: 1.46 mg/dL — ABNORMAL HIGH (ref 0.61–1.24)
GFR, Estimated: 57 mL/min — ABNORMAL LOW (ref >60)
Glucose, Bld: 298 mg/dL — ABNORMAL HIGH (ref 70–99)
Potassium: 4.9 mmol/L (ref 3.5–5.1)
Sodium: 134 mmol/L — ABNORMAL LOW (ref 135–145)

## 2020-10-31 MED ORDER — INSULIN ASPART 100 UNIT/ML ~~LOC~~ SOLN
6.0000 [IU] | Freq: Three times a day (TID) | SUBCUTANEOUS | Status: DC
  Administered 2020-10-31 – 2020-11-01 (×3): 6 [IU] via SUBCUTANEOUS

## 2020-10-31 MED ORDER — GUAIFENESIN ER 600 MG PO TB12
1200.0000 mg | ORAL_TABLET | Freq: Two times a day (BID) | ORAL | Status: DC
  Administered 2020-10-31 – 2020-11-01 (×3): 1200 mg via ORAL
  Filled 2020-10-31 (×3): qty 2

## 2020-10-31 MED ORDER — INSULIN GLARGINE 100 UNIT/ML ~~LOC~~ SOLN
18.0000 [IU] | Freq: Every day | SUBCUTANEOUS | Status: DC
  Administered 2020-10-31: 18 [IU] via SUBCUTANEOUS
  Filled 2020-10-31: qty 0.18

## 2020-10-31 MED ORDER — FUROSEMIDE 10 MG/ML IJ SOLN
40.0000 mg | Freq: Once | INTRAMUSCULAR | Status: AC
  Administered 2020-10-31: 40 mg via INTRAVENOUS
  Filled 2020-10-31: qty 4

## 2020-10-31 NOTE — Progress Notes (Signed)
Inpatient Diabetes Program Recommendations  AACE/[name]: New Consensus Statement on Inpatient Glycemic Control (2015)  Target Ranges:  Prepandial:   less than 140 mg/dL      Peak postprandial:   less than 180 mg/dL (1-2 hours)      Critically ill patients:  140 - 180 mg/dL   Lab Results  Component Value Date   GLUCAP 302 (H) 10/31/2020   HGBA1C 7.3 (H) 10/30/2020    Review of Glycemic Control  Diabetes history: DM2 Outpatient Diabetes medications: metformin 1000 mg BID, Victoza 1.8 mg Daily Current orders for Inpatient glycemic control: Lantus 10 units QHS, Novolog 0-20 units ac/hs + 4 units TID with meals  Getting Solumedrol 60 mg Q6H HgbA1C - 7.3%   Inpatient Diabetes Program Recommendations:     Increase Lantus to 18 units QHS Increase Novolog to 6 units TID with meals  Follow glucose trends.   Thank you. Ailene Ards, RD, LDN, CDE Inpatient Diabetes Coordinator [phone]

## 2020-10-31 NOTE — Progress Notes (Signed)
Patient requesting to leave AMA.  MD aware

## 2020-10-31 NOTE — Progress Notes (Signed)
PROGRESS NOTE    Chris [name].  GXQ:[id number] DOB: [birth year] DOA: 10/29/2020 PCP: Claiborne Rigg, NP   Brief Narrative:  The patient is a 55 year old obese African-American male who is oxygen dependent on COPD at 2 L, has a history of diabetes mellitus type 2, hypertension, chronic diastolic CHF, atrial fibrillation as well as other comorbidities who presented to the hospital with progressive shortness of breath.  X-ray did not show any evidence of pneumonia but he is noted to have significant wheezing and was felt to have a COPD exacerbation and was admitted for further treatments with steroids, bronchodilators and antibiotics.  Patient is steadily improving however he still requiring significant amount of oxygen and desaturated significantly on ambulatory home O2 screen.  He would benefit from a few more days of hospitalization to ensure his respiratory status is stable.  Assessment & Plan:   Principal Problem:   Acute and chronic respiratory failure with hypoxia  Active Problems:   Cigarette smoker   Essential hypertension, benign   COPD with acute exacerbation (HCC)   Diabetes mellitus type 2 in obese (HCC)   HIV (human immunodeficiency virus infection) (HCC)   Atrial fibrillation with RVR (HCC)  Acute on Chronic Respiratory Failure with Hypoxia in the setting of Acute Exacerbation of COPD with likely mild Acute on Chronic Diastolic CHF -At baseline, patient wears 2 L of oxygen -SpO2: 92 % O2 Flow Rate (L/min): 4 L/min; Desaturated significantly today on Home O2 Screen and was requiring 6 Liters of Supplemental O2 via [place] to maintain a Saturation of 85% -D-dimer is negative -Continuous pulse oximetry maintain O2 saturations greater than 90% -Supplemental oxygen via nasal cannula and wean O2 to home requirements -Continue with albuterol nebs every 2 as needed shortness of breath, Brovana 15 mcg neb twice daily, budesonide 0.25 mg nebs twice daily, DuoNeb 3 mL every 6 hours  scheduled, guaifenesin 1200 g p.o. twice daily -Patient is being continued on Incruse Ellipta 1 puff IH daily as well -Continue with steroids with methylprednisolone 60 mg IV every 6 and wean -Patient given azithromycin -Repeat chest x-ray in the a.m. and will need an ambulatory home O2 screen prior to discharge  Acute Exacerbation of COPD  -Continue on intravenous steroids since he is still wheezing and short of breath -Continue inhaled steroids, LABA, SABA as above -Continue on azithromycin  -See treatment above  Paroxysmal Atrial Fibrillation -Anticoagulated with Eliquis -Continue on diltiazem to 40 mg p.o. daily -Repeat EKG to monitor heart rate -Currently being monitored in the progressive unit  Hyperkalemia -He was given a dose of Lokelma yesterday -Patient's K+ went from 5.8 is now 4.9 -Continue to monitor and trend and repeat CMP in a.m.  Diabetes Mellitus Type 2 -Holding home regimen of Metformin and Victoza -Started on basal/bolus dosing and sliding scale -Blood sugars currently currently elevated in the setting of steroid margination -CBG's ranging from 215-357 -We will increase Lantus to 18 units nightly and increase NovoLog 6 units to 3 times daily with meals -We will continue resistant NovoLog/scale insulin before meals and at bedtime  Acute on Chronic Diastolic Congestive Heart Failure -Appears mildly volume overloaded -BNP was elevated on Admission and was 102 -Give a dose of IV Lasix today given that his BUN/creatinine went from 15/1.25 -> 28/1.72 -> 42/1.46 -Strict I's and O's and Daily Weights  GERD -Continue with Pantoprazole 40 mils p.o. daily  AKI on CKD stage II, improving  -Patient's BUN/creatinine went from 15/1.25 -> 28/1.72 -> 42/1.46 -Avoid  nephrotoxic medications, contrast dyes, hypotension and renally adjust medications -Continue to monitor and trend and repeat CMP in a.m.  Leukocytosis -WBC went from 8.4 -> 10.3 -> 18.9 in the setting  of steroid Demargination -With IV antibiotics as above -Continue to monitor for signs and symptoms of infection Repeat CBC in the a.m.  Advanced HIV Disease -C/w Bictegravir-Emtricitabine-Tenofovir 50-200-25 mg 1 tab po daily  -CD4 count absolute was 83, total lymphocyte count was 796, CD4 percent was 10%, CD8 tox was 65, CD 8 T cell absolute was 514 the ratio of 0.16 -Last CD4 Count was 318 -Discussed with ID Dr. Drue Second who does not recommend PCP Prophylaxis but recommends checking a Viral Load  Obesity -Complicates overall prognosis and care -Estimated body mass index is 36.92 kg/m as calculated from the following:   Height as of this encounter: 5\' 9"  (1.753 m).   Weight as of this encounter: 113.4 kg. -Weight Loss and Dietary Counseling given   DVT prophylaxis: Anticoagulated with Apixaban  Code Status: FULL CODE  Family Communication: No family present at bedside  Disposition Plan: Pending further clinical improvement   Status is: Inpatient  Remains inpatient appropriate because:Unsafe d/c plan, IV treatments appropriate due to intensity of illness or inability to take PO and Inpatient level of care appropriate due to severity of illness   Dispo: The patient is from: Home              Anticipated d/c is to: Home              Patient currently is not medically stable to d/c.   Difficult to place patient No   Consultants: None   Procedures: None  Antimicrobials:  Anti-infectives (From admission, onward)   Start     Dose/Rate Route Frequency Ordered Stop   10/30/20 1430  azithromycin (ZITHROMAX) 500 mg in sodium chloride 0.9 % 250 mL IVPB        500 mg 250 mL/hr over 60 Minutes Intravenous Every 24 hours 10/29/20 1508     10/30/20 1000  bictegravir-emtricitabine-tenofovir AF (BIKTARVY) 50-200-25 MG per tablet 1 tablet        1 tablet Oral Daily 10/29/20 1508     10/29/20 1415  azithromycin (ZITHROMAX) 500 mg in sodium chloride 0.9 % 250 mL IVPB        500 mg 250  mL/hr over 60 Minutes Intravenous  Once 10/29/20 1413 10/29/20 1925        Subjective: Seen and examined at bedside and thinks his respiratory status was doing much better but he significantly dropped on ambulation requiring 6 L and saturating at 85%.  Patient felt stable and wanted to go home I told him I do not feel that he is medically stable to be discharged at this time and required at least another 1 day of treatment.  He wanted to leave AMA but then subsequently changed his mind.  We will give him a dose of IV Lasix and he denies any chest pain or lightheadedness or dizziness now.  Objective: Vitals:   10/30/20 2329 10/31/20 0247 10/31/20 0253 10/31/20 0325  BP: (!) 174/89   (!) 159/92  Pulse: (!) 56   82  Resp: 18   19  Temp: 98.2 F (36.8 C)     TempSrc: Oral     SpO2: 95% (!) 87% 92% 94%  Weight:      Height:        Intake/Output Summary (Last 24 hours) at 10/31/2020 0815 Last data  filed at 10/31/2020 0600 Gross per 24 hour  Intake 730 ml  Output 2025 ml  Net -1295 ml   Filed Weights   10/29/20 1247  Weight: 113.4 kg   Examination: Physical Exam:  Constitutional: WN/WD morbidly obese African-American male currently no acute distress Eyes: Lids and conjunctivae normal, sclerae anicteric  ENMT: External Ears, Nose appear normal. Grossly normal hearing. Neck: Appears normal, supple, no cervical masses, normal ROM, no appreciable thyromegaly; difficult to assess for JVD given his body habitus Respiratory: Diminished to auscultation bilaterally with coarse breath sounds and some wheezing noted.  Has unlabored breathing but wearing 4 L supplemental oxygen via nasal cannula. Cardiovascular: RRR, no murmurs / rubs / gallops. S1 and S2 auscultated.  Has 1+ lower extremity extremity edema. Abdomen: Soft, non-tender, distended secondary body habitus. Bowel sounds positive.  GU: Deferred. Musculoskeletal: No clubbing / cyanosis of digits/nails. No joint deformity upper and  lower extremities.  Skin: No rashes, lesions, ulcers on limited skin evaluation. No induration; Warm and dry.  Neurologic: CN 2-12 grossly intact with no focal deficits. Romberg sign and cerebellar reflexes not assessed.  Psychiatric: Normal judgment and insight. Alert and oriented x 3.  Slightly anxious mood and appropriate affect.   Data Reviewed: I have personally reviewed following labs and imaging studies  CBC: Recent Labs  Lab 10/29/20 1214 10/30/20 0225  WBC 8.4 10.3  NEUTROABS 4.2  --   HGB 13.8 13.2  HCT 45.2 44.4  MCV 96.4 99.3  PLT 230 243   Basic Metabolic Panel: Recent Labs  Lab 10/29/20 1214 10/30/20 0225 10/30/20 1345 10/31/20 0508  NA 137 135  --  134*  K 4.5 5.8* 5.0 4.9  CL 102 100  --  101  CO2 28 25  --  25  GLUCOSE 154* 181*  --  298*  BUN 15 28*  --  42*  CREATININE 1.25* 1.72*  --  1.46*  CALCIUM 9.0 9.1  --  9.1   GFR: Estimated Creatinine Clearance: 71.8 mL/min (A) (by C-G formula based on SCr of 1.46 mg/dL (H)). Liver Function Tests: Recent Labs  Lab 10/29/20 1214  AST 36  ALT 31  ALKPHOS 45  BILITOT 0.8  PROT 8.2*  ALBUMIN 3.4*   No results for input(s): LIPASE, AMYLASE in the last 168 hours. No results for input(s): AMMONIA in the last 168 hours. Coagulation Profile: No results for input(s): INR, PROTIME in the last 168 hours. Cardiac Enzymes: No results for input(s): CKTOTAL, CKMB, CKMBINDEX, TROPONINI in the last 168 hours. BNP (last 3 results) No results for input(s): PROBNP in the last 8760 hours. HbA1C: Recent Labs    10/30/20 0225  HGBA1C 7.3*   CBG: Recent Labs  Lab 10/29/20 2319 10/30/20 0758 10/30/20 1155 10/30/20 1620 10/30/20 1954  GLUCAP 246* 215* 248* 280* 357*   Lipid Profile: No results for input(s): CHOL, HDL, LDLCALC, TRIG, CHOLHDL, LDLDIRECT in the last 72 hours. Thyroid Function Tests: No results for input(s): TSH, T4TOTAL, FREET4, T3FREE, THYROIDAB in the last 72 hours. Anemia Panel: No  results for input(s): VITAMINB12, FOLATE, FERRITIN, TIBC, IRON, RETICCTPCT in the last 72 hours. Sepsis Labs: No results for input(s): PROCALCITON, LATICACIDVEN in the last 168 hours.  Recent Results (from the past 240 hour(s))  SARS CORONAVIRUS 2 (TAT 6-24 HRS) Nasopharyngeal Nasopharyngeal Swab     Status: None   Collection Time: 10/29/20 12:08 PM   Specimen: Nasopharyngeal Swab  Result Value Ref Range Status   SARS Coronavirus 2 NEGATIVE NEGATIVE Final  Comment: (NOTE) SARS-CoV-2 target nucleic acids are NOT DETECTED.  The SARS-CoV-2 RNA is generally detectable in upper and lower respiratory specimens during the acute phase of infection. Negative results do not preclude SARS-CoV-2 infection, do not rule out co-infections with other pathogens, and should not be used as the sole basis for treatment or other patient management decisions. Negative results must be combined with clinical observations, patient history, and epidemiological information. The expected result is Negative.  Fact Sheet for Patients: HairSlick.no  Fact Sheet for Healthcare Providers: quierodirigir.com  This test is not yet approved or cleared by the [place] FDA and  has been authorized for detection and/or diagnosis of SARS-CoV-2 by FDA under an Emergency Use Authorization (EUA). This EUA will remain  in effect (meaning this test can be used) for the duration of the COVID-19 declaration under Se ction 564(b)(1) of the Act, 21 U.S.C. section 360bbb-3(b)(1), unless the authorization is terminated or revoked sooner.  Performed at [facility], [address], [place], [place] [address]      RN Pressure Injury Documentation:     Estimated body mass index is 36.92 kg/m as calculated from the following:   Height as of this encounter: 5\' 9"  (1.753 m).   Weight as of this encounter: 113.4 kg.  Malnutrition Type:   Malnutrition  Characteristics:   Nutrition Interventions:    Radiology Studies: DG Chest Port 1 View  Result Date: 10/29/2020 CLINICAL DATA:  Shortness of breath. EXAM: PORTABLE CHEST 1 VIEW COMPARISON:  April 30, 2020. FINDINGS: Stable cardiomegaly. No pneumothorax or pleural effusion is noted. Both lungs are clear. The visualized skeletal structures are unremarkable. IMPRESSION: No active disease. Electronically Signed   By: May 02, 2020 M.D.   On: 10/29/2020 12:55   Scheduled Meds: . apixaban  5 mg Oral BID  . arformoterol  15 mcg Nebulization BID  . bictegravir-emtricitabine-tenofovir AF  1 tablet Oral Daily  . budesonide (PULMICORT) nebulizer solution  0.25 mg Nebulization BID  . diltiazem  240 mg Oral Daily  . DULoxetine  60 mg Oral Daily  . insulin aspart  0-20 Units Subcutaneous TID WC  . insulin aspart  0-5 Units Subcutaneous QHS  . insulin aspart  4 Units Subcutaneous TID WC  . insulin glargine  10 Units Subcutaneous QHS  . ipratropium-albuterol  3 mL Nebulization Q6H  . mouth rinse  15 mL Mouth Rinse BID  . methylPREDNISolone (SOLU-MEDROL) injection  60 mg Intravenous Q6H  . pantoprazole  40 mg Oral Daily  . umeclidinium bromide  1 puff Inhalation Daily   Continuous Infusions: . azithromycin 500 mg (10/30/20 1351)    LOS: 2 days   11/01/20, DO [facility] PAGER is on AMION  If 7PM-7AM, please contact night-coverage [email or web address]

## 2020-10-31 NOTE — Discharge Summary (Signed)
Physician Discharge Summary  Chris [name]. DGU:[id number] DOB: 1966/02/27 DOA: 10/29/2020  PCP: [name], NP  Admit date: 10/29/2020 Discharge date: 11/01/2020  Admitted From: Home Disposition: Home   Recommendations for Outpatient Follow-up:  1. Follow up with PCP in 1-2 weeks 2. Follow up with Pulmonary within 1 week; Has an appointment with [name] on 3/28 3. Please obtain CMP/CBC, Mag, Phos in one week 4. Repeat CXR in 3-6 weeks  5. Please follow up on the following pending results:  Home Health: No Equipment/Devices: None  Discharge Condition: Guarded CODE STATUS: FULL CODE Diet recommendation: Heart Healthy Carb Modified Diet   Brief/Interim Summary: The patient is a 55 year old obese African-American male who is oxygen dependent on COPD at 2 L, has a history of diabetes mellitus type 2, hypertension, chronic diastolic CHF, atrial fibrillation as well as other comorbidities who presented to the hospital with progressive shortness of breath.  X-ray did not show any evidence of pneumonia but he is noted to have significant wheezing and was felt to have a COPD exacerbation and was admitted for further treatments with steroids, bronchodilators and antibiotics.  Patient is steadily improving however he still requiring significant amount of oxygen and desaturated significantly on ambulatory home O2 screen yesterday.  Today he improved after an additional day of hospitalization after he received IV steroids.  His respiratory status is improved significantly and he desaturated but was able to maintain his saturations on 4 L.  He is stable to be discharged and will follow up with his PCP and pulmonologist within 1 to 2 weeks.  He was resumed on Lasix and advised to follow-up with cardiology as well.  Discharge Diagnoses:  Principal Problem:   Acute and chronic respiratory failure with hypoxia  Active Problems:   Cigarette smoker   Essential hypertension, benign   COPD with acute  exacerbation (HCC)   Diabetes mellitus type 2 in obese (HCC)   HIV (human immunodeficiency virus infection) ([place])   Atrial fibrillation with RVR (HCC)  Acute on Chronic Respiratory Failure with Hypoxia in the setting of Acute Exacerbation of COPD with likely mild Acute on Chronic Diastolic CHF -At baseline, patient wears 2 L of oxygen -SpO2: 92 % O2 Flow Rate (L/min): 4 L/min; Desaturated significantly yesterday on Home O2 Screen and was requiring 6 Liters of Supplemental O2 via [place] to maintain a Saturation of 85% but today he improved significantly -D-dimer is negative -Continuous pulse oximetry maintain O2 saturations greater than 90% -Supplemental oxygen via nasal cannula and wean O2 to home requirements -Continue with albuterol nebs every 2 as needed shortness of breath, Brovana 15 mcg neb twice daily, budesonide 0.25 mg nebs twice daily, DuoNeb 3 mL every 6 hours scheduled, guaifenesin 1200 g p.o. twice daily -Patient is being continued on Incruse Ellipta 1 puff IH daily as well -Continue with steroids with methylprednisolone 60 mg IV every 6 and wean -Patient given azithromycin -Patient did not desaturate on repeat home evaluation -Repeat chest x-ray in 3 to 6 weeks and follow-up with pulmonary within a week and his appointment [name] on 10/28/2020  Acute Exacerbation of COPD  -Continue on intravenous steroids since he is still wheezing and short of breath -Continue inhaled steroids, LABA, SABA as above -Continue on azithromycin  -See treatment above -Follow-up with pulmonary in outpatient setting within 1 to 2 weeks  Paroxysmal Atrial Fibrillation -Anticoagulated with Eliquis -Continue on diltiazem to 40 mg p.o. daily -Repeat EKG to monitor heart rate -Currently being monitored  in the progressive unit -Follow-up in outpatient setting follow-up with cardiology in outpatient setting  Hyperkalemia -He was given a dose of Lokelma yesterday -Patient's K+ went from 5.8 is now  5.1 -Continue to monitor and trend and repeat CMP in a.m.  Diabetes Mellitus Type 2 -Holding home regimen of Metformin and Victoza -Started on basal/bolus dosing and sliding scale -Blood sugars currently currently elevated in the setting of steroid margination -CBG's ranging from 176-409 -We will increase Lantus to 18 units nightly and increase NovoLog 6 units to 3 times daily with meals -We will continue resistant NovoLog/scale insulin before meals and at bedtime -Resume home meds and he will be tapered on a steroid so expect blood sugars to improve  Acute on Chronic Diastolic Congestive Heart Failure -Appears mildly volume overloaded -BNP was elevated on Admission and was 102 -Give a dose of IV Lasix today given that his BUN/creatinine went from 15/1.25 -> 28/1.72 -> 42/1.46 today it is 42/1.39 -Strict I's and O's and Daily Weights -He was given an additional dose of IV Lasix prior to discharge next-we will need to continue monitor for signs and symptoms of iron overload and will resume his home Lasix dosing as he is no longer been taking it  GERD -Continue with Pantoprazole 40 mils p.o. daily  AKI on CKD stage II, improving  -Patient's BUN/creatinine went from 15/1.25 -> 28/1.72 -> 42/1.46 and today was 42/1.39 -Avoid nephrotoxic medications, contrast dyes, hypotension and renally adjust medications -Continue to monitor and trend and repeat CMP in a.m.  Leukocytosis -WBC went from 8.4 -> 10.3 -> 18.9 in the setting of steroid Demargination and today was 16.4 -Continued with IV antibiotics as above and changed to p.o. azithromycin at discharge -Continue to monitor for signs and symptoms of infection Repeat CBC in the a.m.  Advanced HIV Disease -C/w Bictegravir-Emtricitabine-Tenofovir 50-200-25 mg 1 tab po daily  -CD4 count absolute was 83, total lymphocyte count was 796, CD4 percent was 10%, CD8 tox was 65, CD 8 T cell absolute was 514 the ratio of 0.16 -Last CD4 Count was  318 -Discussed with ID Dr. Baxter Flattery who does not recommend PCP Prophylaxis but recommends checking a Viral Load -Patient has a follow-up appointment [facility] in 4 weeks  Obesity -Complicates overall prognosis and care -Estimated body mass index is 36.92 kg/m as calculated from the following:   Height as of this encounter: '5\' 9"'  (1.753 m).   Weight as of this encounter: 113.4 kg. -Weight Loss and Dietary Counseling given   Discharge Instructions  Discharge Instructions    (HEART FAILURE PATIENTS) Call MD:  Anytime you have any of the following symptoms: 1) 3 pound weight gain in 24 hours or 5 pounds in 1 week 2) shortness of breath, with or without a dry hacking cough 3) swelling in the hands, feet or stomach 4) if you have to sleep on extra pillows at night in order to breathe.   Complete by: As directed    Call MD for:  difficulty breathing, headache or visual disturbances   Complete by: As directed    Call MD for:  extreme fatigue   Complete by: As directed    Call MD for:  hives   Complete by: As directed    Call MD for:  persistant dizziness or light-headedness   Complete by: As directed    Call MD for:  persistant nausea and vomiting   Complete by: As directed    Call MD for:  redness, tenderness, or  signs of infection (pain, swelling, redness, odor or green/yellow discharge around incision site)   Complete by: As directed    Call MD for:  severe uncontrolled pain   Complete by: As directed    Call MD for:  temperature >100.4   Complete by: As directed    Diet - low sodium heart healthy   Complete by: As directed    Discharge instructions   Complete by: As directed    You were cared for by a hospitalist during your hospital stay. If you have any questions about your discharge medications or the care you received while you were in the hospital after you are discharged, you can call the unit and ask to speak with the hospitalist on call if the hospitalist that took care of  you is not available. Once you are discharged, your primary care physician will handle any further medical issues. Please note that NO REFILLS for any discharge medications will be authorized once you are discharged, as it is imperative that you return to your primary care physician (or establish a relationship with a primary care physician if you do not have one) for your aftercare needs so that they can reassess your need for medications and monitor your lab values.  Follow up with PCP and Pulmonary in the outpatient setting. Take all medications as prescribed. If symptoms change or worsen please return to the ED for evaluation   For home use only DME Nebulizer machine   Complete by: As directed    Patient needs a nebulizer to treat with the following condition: COPD (chronic obstructive pulmonary disease) (HCC)   Length of Need: 6 Months   Increase activity slowly   Complete by: As directed      Allergies as of 11/01/2020      Reactions   Bactrim [sulfamethoxazole-trimethoprim] Hives      Medication List    STOP taking these medications   baclofen 10 MG tablet Commonly known as: LIORESAL   diltiazem 240 MG 24 hr capsule Commonly known as: CARDIZEM CD     TAKE these medications   acetaminophen-codeine 300-30 MG tablet Commonly known as: TYLENOL #3 Take 1-2 tablets by mouth every 12 (twelve) hours as needed for moderate pain.   albuterol 108 (90 Base) MCG/ACT inhaler Commonly known as: VENTOLIN HFA TAKE 2 PUFFS BY MOUTH EVERY 6 HOURS AS NEEDED FOR WHEEZE OR SHORTNESS OF BREATH What changed: See the new instructions.   apixaban 5 MG Tabs tablet Commonly known as: Eliquis Take 1 tablet (5 mg total) by mouth 2 (two) times daily.   azithromycin 500 MG tablet Commonly known as: Zithromax Take 1 tablet (500 mg total) by mouth daily for 2 days. Take 1 tablet daily for 3 days. Notes to patient: This evening with dinner.   B-D UF III MINI PEN NEEDLES 31G X 5 MM Misc Generic drug:  Insulin Pen Needle Use as instructed. Inject into the skin once daily E11.65   Biktarvy 50-200-25 MG Tabs tablet Generic drug: bictegravir-emtricitabine-tenofovir AF Take 1 tablet by mouth daily.   Breztri Aerosphere 160-9-4.8 MCG/ACT Aero Generic drug: Budeson-Glycopyrrol-Formoterol Inhale 2 puffs into the lungs 2 (two) times daily.   diltiazem 240 MG 24 hr capsule Commonly known as: TIAZAC Take 240 mg by mouth daily.   DULoxetine 60 MG capsule Commonly known as: CYMBALTA Take 1 capsule (60 mg total) by mouth daily.   fluticasone 50 MCG/ACT nasal spray Commonly known as: FLONASE Place 2 sprays into both nostrils daily.  furosemide 40 MG tablet Commonly known as: LASIX Take 1 tablet (40 mg total) by mouth 2 (two) times daily.   guaiFENesin 600 MG 12 hr tablet Commonly known as: MUCINEX Take 1 tablet (600 mg total) by mouth 2 (two) times daily for 5 days.   ipratropium-albuterol 0.5-2.5 (3) MG/3ML Soln Commonly known as: DUONEB Take 3 mLs by nebulization every 6 (six) hours as needed.   losartan 25 MG tablet Commonly known as: COZAAR Take 0.5 tablets (12.5 mg total) by mouth daily. Must have office visit for refills   metFORMIN 1000 MG tablet Commonly known as: GLUCOPHAGE Take 1 tablet (1,000 mg total) by mouth 2 (two) times daily with a meal.   Misc. Devices Misc Please provide patient with insurance approved portable O2 concentrator and supplies J96.01, J44.9   omeprazole 40 MG capsule Commonly known as: PRILOSEC TAKE 1 CAPSULE (40 MG TOTAL) BY MOUTH IN THE MORNING. What changed: See the new instructions.   OneTouch Delica Lancets 20U Misc Use as instructed. Check blood glucose level by fingerstick twice per day. E11.65   OneTouch Verio test strip Generic drug: glucose blood Use as instructed. Check blood glucose level by fingerstick twice per day. E11.65   OneTouch Verio w/Device Kit 1 kit by Does not apply route 2 (two) times daily.   OXYGEN Inhale 2  L into the lungs.   polyethylene glycol 17 g packet Commonly known as: MIRALAX / GLYCOLAX Take 17 g by mouth daily as needed for mild constipation.   predniSONE 10 MG (21) Tbpk tablet Commonly known as: STERAPRED UNI-PAK 21 TAB Take 6 pills on day 1, 5 pills on day 2, 4 pills on day 3, 3 pills on day 4, 2 pills on day 5, 1 pill on day 6 and stop   sildenafil 100 MG tablet Commonly known as: VIAGRA Take 1 tablet (100 mg total) by mouth daily as needed for erectile dysfunction.   Victoza 18 MG/3ML Sopn Generic drug: liraglutide Inject 1.8 mg into the skin daily.   Vitamin D (Ergocalciferol) 1.25 MG (50000 UNIT) Caps capsule Commonly known as: DRISDOL Take 1 capsule (50,000 Units total) by mouth every 7 (seven) days.            Durable Medical Equipment  (From admission, onward)         Start     Ordered   11/01/20 0000  For home use only DME Nebulizer machine       Question Answer Comment  Patient needs a nebulizer to treat with the following condition COPD (chronic obstructive pulmonary disease) ([place])   Length of Need 6 Months      11/01/20 1137          Follow-up [facility] Follow up.   Why: Home oxygen Contact information: [place] [address] [phone]              Allergies  Allergen Reactions  . Bactrim [Sulfamethoxazole-Trimethoprim] Hives    Consultations:  None   Procedures/Studies: DG Chest Port 1 View  Result Date: 10/29/2020 CLINICAL DATA:  Shortness of breath. EXAM: PORTABLE CHEST 1 VIEW COMPARISON:  April 30, 2020. FINDINGS: Stable cardiomegaly. No pneumothorax or pleural effusion is noted. Both lungs are clear. The visualized skeletal structures are unremarkable. IMPRESSION: No active disease. Electronically Signed   By: Marijo Conception M.D.   On: 10/29/2020 12:55    Subjective: Seen and examined at bedside he was doing much better  and thinks his respiratory status is improved.  He  ambulated and did not desaturate.  He is stable for discharge and he will follow up with his PCP, cardiologist as well as pulmonologist.  Is no other concerns or complaints at this time and all questions were answered to his satisfaction.  Discharge Exam: Vitals:   11/01/20 0622 11/01/20 0831  BP: (!) 141/74   Pulse: 86   Resp: 19   Temp: 98.1 F (36.7 C)   SpO2: 100% 95%   Vitals:   10/31/20 2148 11/01/20 0139 11/01/20 0622 11/01/20 0831  BP: (!) 155/82  (!) 141/74   Pulse: 95  86   Resp: 18  19   Temp: (!) 97.3 F (36.3 C)  98.1 F (36.7 C)   TempSrc: Oral  Oral   SpO2: 93% 92% 100% 95%  Weight:      Height:       General: Pt is alert, awake, not in acute distress Cardiovascular: RRR, S1/S2 +, no rubs, no gallops Respiratory: Diminished bilaterally with coarse breath sounds, very mild wheezing, no rhonchi; wearing supplemental oxygen via nasal cannula Abdominal: Soft, NT, distended secondary to body, bowel sounds + Extremities: 1+ lower extremity edema, no cyanosis  The results of significant diagnostics from this hospitalization (including imaging, microbiology, ancillary and laboratory) are listed below for reference.    Microbiology: Recent Results (from the past 240 hour(s))  SARS CORONAVIRUS 2 (TAT 6-24 HRS) Nasopharyngeal Nasopharyngeal Swab     Status: None   Collection Time: 10/29/20 12:08 PM   Specimen: Nasopharyngeal Swab  Result Value Ref Range Status   SARS Coronavirus 2 NEGATIVE NEGATIVE Final    Comment: (NOTE) SARS-CoV-2 target nucleic acids are NOT DETECTED.  The SARS-CoV-2 RNA is generally detectable in upper and lower respiratory specimens during the acute phase of infection. Negative results do not preclude SARS-CoV-2 infection, do not rule out co-infections with other pathogens, and should not be used as the sole basis for treatment or other patient management decisions. Negative results must be combined with clinical observations, patient  history, and epidemiological information. The expected result is Negative.  Fact Sheet for Patients: SugarRoll.be  Fact Sheet for Healthcare Providers: https://www.woods-mathews.com/  This test is not yet approved or cleared by the [place] FDA and  has been authorized for detection and/or diagnosis of SARS-CoV-2 by FDA under an Emergency Use Authorization (EUA). This EUA will remain  in effect (meaning this test can be used) for the duration of the COVID-19 declaration under Se ction 564(b)(1) of the Act, 21 U.S.C. section 360bbb-3(b)(1), unless the authorization is terminated or revoked sooner.  Performed at [facility], [place] [address], [place], [place] [address]     Labs: BNP (last 3 results) Recent Labs    04/30/20 0737 10/29/20 1214  BNP 88.7 505.6*   Basic Metabolic Panel: Recent Labs  Lab 10/29/20 1214 10/30/20 0225 10/30/20 1345 10/31/20 0508 11/01/20 0526  NA 137 135  --  134* 134*  K 4.5 5.8* 5.0 4.9 5.1  CL 102 100  --  101 100  CO2 28 25  --  25 27  GLUCOSE 154* 181*  --  298* 308*  BUN 15 28*  --  42* 42*  CREATININE 1.25* 1.72*  --  1.46* 1.39*  CALCIUM 9.0 9.1  --  9.1 8.9   Liver Function Tests: Recent Labs  Lab 10/29/20 1214  AST 36  ALT 31  ALKPHOS 45  BILITOT 0.8  PROT 8.2*  ALBUMIN 3.4*   No results for input(s): LIPASE, AMYLASE in the last 168 hours. No results for input(s): AMMONIA in the last 168 hours. CBC: Recent Labs  Lab 10/29/20 1214 10/30/20 0225 10/31/20 1036 11/01/20 1228  WBC 8.4 10.3 18.9* 16.4*  NEUTROABS 4.2  --  17.4* 15.0*  HGB 13.8 13.2 13.3 13.9  HCT 45.2 44.4 43.9 46.3  MCV 96.4 99.3 97.1 97.3  PLT 230 243 263 265   Cardiac Enzymes: No results for input(s): CKTOTAL, CKMB, CKMBINDEX, TROPONINI in the last 168 hours. BNP: Invalid input(s): POCBNP CBG: Recent Labs  Lab 10/31/20 1627 10/31/20 2147 10/31/20 2148 11/01/20 0813 11/01/20 1114  GLUCAP  176* 409* 388* 295* 315*   D-Dimer No results for input(s): DDIMER in the last 72 hours. Hgb A1c Recent Labs    10/30/20 0225  HGBA1C 7.3*   Lipid Profile No results for input(s): CHOL, HDL, LDLCALC, TRIG, CHOLHDL, LDLDIRECT in the last 72 hours. Thyroid function studies No results for input(s): TSH, T4TOTAL, T3FREE, THYROIDAB in the last 72 hours.  Invalid input(s): FREET3 Anemia work up No results for input(s): VITAMINB12, FOLATE, FERRITIN, TIBC, IRON, RETICCTPCT in the last 72 hours. Urinalysis    Component Value Date/Time   COLORURINE YELLOW 10/29/2020 1800   APPEARANCEUR CLEAR 10/29/2020 1800   LABSPEC 1.014 10/29/2020 1800   PHURINE 5.0 10/29/2020 1800   GLUCOSEU NEGATIVE 10/29/2020 1800   HGBUR SMALL (A) 10/29/2020 1800   BILIRUBINUR NEGATIVE 10/29/2020 1800   BILIRUBINUR negative 06/04/2020 1135   BILIRUBINUR neg 11/12/2016 0946   KETONESUR NEGATIVE 10/29/2020 1800   PROTEINUR 100 (A) 10/29/2020 1800   UROBILINOGEN 0.2 06/04/2020 1135   UROBILINOGEN 1 09/21/2014 1205   NITRITE NEGATIVE 10/29/2020 1800   LEUKOCYTESUR NEGATIVE 10/29/2020 1800   Sepsis Labs Invalid input(s): PROCALCITONIN,  WBC,  LACTICIDVEN Microbiology Recent Results (from the past 240 hour(s))  SARS CORONAVIRUS 2 (TAT 6-24 HRS) Nasopharyngeal Nasopharyngeal Swab     Status: None   Collection Time: 10/29/20 12:08 PM   Specimen: Nasopharyngeal Swab  Result Value Ref Range Status   SARS Coronavirus 2 NEGATIVE NEGATIVE Final    Comment: (NOTE) SARS-CoV-2 target nucleic acids are NOT DETECTED.  The SARS-CoV-2 RNA is generally detectable in upper and lower respiratory specimens during the acute phase of infection. Negative results do not preclude SARS-CoV-2 infection, do not rule out co-infections with other pathogens, and should not be used as the sole basis for treatment or other patient management decisions. Negative results must be combined with clinical observations, patient history, and  epidemiological information. The expected result is Negative.  Fact Sheet for Patients: SugarRoll.be  Fact Sheet for Healthcare Providers: https://www.woods-mathews.com/  This test is not yet approved or cleared by the [place] FDA and  has been authorized for detection and/or diagnosis of SARS-CoV-2 by FDA under an Emergency Use Authorization (EUA). This EUA will remain  in effect (meaning this test can be used) for the duration of the COVID-19 declaration under Se ction 564(b)(1) of the Act, 21 U.S.C. section 360bbb-3(b)(1), unless the authorization is terminated or revoked sooner.  Performed at [facility], [place] [address] [place], [place] [address]    Time coordinating discharge: 35 minutes  SIGNED:  Kerney Elbe, DO [facility] 11/01/2020, 8:28 PM Pager is on [place]  If 7PM-7AM, please contact night-coverage [email or web address]

## 2020-10-31 NOTE — Progress Notes (Signed)
OT Cancellation Note  Patient Details Name: Chris [name]. MRN: [id number] DOB: 1966/05/14   Cancelled Treatment:    Reason Eval/Treat Not Completed: OT screened, no needs identified, will sign off. Patient seated in chair, reports going home today. Seen by PT and was mod I with mobility. Patient reports no concerns with self care at home, declines OT. No further acute needs.   [name] OT OT pager: [phone]  [name] 10/31/2020, 2:15 PM

## 2020-10-31 NOTE — Progress Notes (Signed)
Pt alert and aware sitting up in chair. He states he is feeling much better than when he came into the hospital.  The chaplain offered caring and supportive presence.

## 2020-10-31 NOTE — Evaluation (Signed)
Physical Therapy Evaluation-1x Patient Details Name: Chris [name]. MRN: [id number] DOB: 01-Jan-1966 Today's Date: 10/31/2020    SATURATION QUALIFICATIONS: (This note is used to comply with regulatory documentation for home oxygen)  Patient Saturations on Room Air at Rest = n/a (pt is O2 dep @ baseline)  Patient Saturations on Room Air while Ambulating = n/a (pt is O2 dep @ baseline)  Patient Saturations on 6 Liters of oxygen while Ambulating = 85%      History of Present Illness  55 yo male admitted with acute on chronic resp failure. Hx of DM, HIV, COPD-O2 dep 2L @ baseline, obesity, depression, anxiety, a fib  Clinical Impression  On eval, pt was Mod Ind with mobility. Activity limited by dyspnea and drop in O2 sats. At rest: 91% on 4L. End of session: 91% on 4L. No acute PT needs. 1x eval. Nursing can assess O2 during mobility as necessary. Will sign off.     Follow Up Recommendations No PT follow up    Equipment Recommendations  None recommended by PT    Recommendations for Other Services       Precautions / Restrictions Precautions Precautions: None Precaution Comments: O2 dep @ baseline Restrictions Weight Bearing Restrictions: No      Mobility  Bed Mobility               General bed mobility comments: oob in recliner    Transfers Overall transfer level: Modified independent                  Ambulation/Gait Ambulation/Gait assistance: Modified independent (Device/Increase time) Gait Distance (Feet): 150 Feet Assistive device: None Gait Pattern/deviations: Step-through pattern     General Gait Details: One standing rest break ~75 feet. Cues for pursed lip/deep breathing. O2 85% on 6L, dyspnea 3/4.  Stairs            Wheelchair Mobility    Modified Rankin (Stroke Patients Only)       Balance Overall balance assessment: Modified Independent                                           Pertinent Vitals/Pain  Pain Assessment: No/denies pain    Home Living Family/patient expects to be discharged to:: Private residence Living Arrangements: Spouse/significant other Available Help at Discharge: Family;Available PRN/intermittently Type of Home: Apartment Home Access: Stairs to enter Entrance Stairs-Rails: Right Entrance Stairs-Number of Steps: 6 Home Layout: One level Home Equipment: Walker - 2 wheels;Bedside commode Additional Comments: home O2 at 2L    Prior Function Level of Independence: Independent               Hand Dominance        Extremity/Trunk Assessment   Upper Extremity Assessment Upper Extremity Assessment: Overall WFL for tasks assessed    Lower Extremity Assessment Lower Extremity Assessment: Overall WFL for tasks assessed    Cervical / Trunk Assessment Cervical / Trunk Assessment: Normal  Communication   Communication: No difficulties  Cognition Arousal/Alertness: Awake/alert Behavior During Therapy: WFL for tasks assessed/performed Overall Cognitive Status: Within Functional Limits for tasks assessed                                        General Comments      Exercises  Assessment/Plan    PT Assessment Patent does not need any further PT services (nursing can continue to assess O2 during mobility as needed)  PT Problem List         PT Treatment Interventions      PT Goals (Current goals can be found in the Care Plan section)  Acute Rehab PT Goals Patient Stated Goal: home PT Goal Formulation: All assessment and education complete, DC therapy    Frequency     Barriers to discharge        Co-evaluation               AM-PAC PT "6 Clicks" Mobility  Outcome Measure Help needed turning from your back to your side while in a flat bed without using bedrails?: None Help needed moving from lying on your back to sitting on the side of a flat bed without using bedrails?: None Help needed moving to and from a bed to a  chair (including a wheelchair)?: None Help needed standing up from a chair using your arms (e.g., wheelchair or bedside chair)?: None Help needed to walk in hospital room?: None Help needed climbing 3-5 steps with a railing? : A Little 6 Click Score: 23    End of Session Equipment Utilized During Treatment: Oxygen Activity Tolerance: Patient tolerated treatment well (but limited by dyspnea, drop in sats) Patient left: in chair;with call bell/phone within reach        Time: 1100-1113 PT Time Calculation (min) (ACUTE ONLY): 13 min   Charges:   PT Evaluation $PT Eval Low Complexity: 1 Low             Faye Ramsay, PT Acute Rehabilitation  Office: [phone] Pager: [phone]

## 2020-11-01 LAB — BASIC METABOLIC PANEL
Anion gap: 7 (ref 5–15)
BUN: 42 mg/dL — ABNORMAL HIGH (ref 6–20)
CO2: 27 mmol/L (ref 22–32)
Calcium: 8.9 mg/dL (ref 8.9–10.3)
Chloride: 100 mmol/L (ref 98–111)
Creatinine, Ser: 1.39 mg/dL — ABNORMAL HIGH (ref 0.61–1.24)
GFR, Estimated: >60 mL/min (ref >60)
Glucose, Bld: 308 mg/dL — ABNORMAL HIGH (ref 70–99)
Potassium: 5.1 mmol/L (ref 3.5–5.1)
Sodium: 134 mmol/L — ABNORMAL LOW (ref 135–145)

## 2020-11-01 LAB — CBC WITH DIFFERENTIAL/PLATELET
Abs Immature Granulocytes: 0.1 10*3/uL — ABNORMAL HIGH (ref 0.00–0.07)
Basophils Absolute: 0 10*3/uL (ref 0.0–0.1)
Basophils Relative: 0 %
Eosinophils Absolute: 0 10*3/uL (ref 0.0–0.5)
Eosinophils Relative: 0 %
HCT: 46.3 % (ref 39.0–52.0)
Hemoglobin: 13.9 g/dL (ref 13.0–17.0)
Immature Granulocytes: 1 %
Lymphocytes Relative: 3 %
Lymphs Abs: 0.6 10*3/uL — ABNORMAL LOW (ref 0.7–4.0)
MCH: 29.2 pg (ref 26.0–34.0)
MCHC: 30 g/dL (ref 30.0–36.0)
MCV: 97.3 fL (ref 80.0–100.0)
Monocytes Absolute: 0.7 10*3/uL (ref 0.1–1.0)
Monocytes Relative: 5 %
Neutro Abs: 15 10*3/uL — ABNORMAL HIGH (ref 1.7–7.7)
Neutrophils Relative %: 91 %
Platelets: 265 10*3/uL (ref 150–400)
RBC: 4.76 MIL/uL (ref 4.22–5.81)
RDW: 12.5 % (ref 11.5–15.5)
WBC: 16.4 10*3/uL — ABNORMAL HIGH (ref 4.0–10.5)
nRBC: 0 % (ref 0.0–0.2)

## 2020-11-01 LAB — GLUCOSE, CAPILLARY
Glucose-Capillary: 295 mg/dL — ABNORMAL HIGH (ref 70–99)
Glucose-Capillary: 315 mg/dL — ABNORMAL HIGH (ref 70–99)

## 2020-11-01 MED ORDER — FUROSEMIDE 10 MG/ML IJ SOLN
40.0000 mg | Freq: Once | INTRAMUSCULAR | Status: AC
  Administered 2020-11-01: 40 mg via INTRAVENOUS
  Filled 2020-11-01: qty 4

## 2020-11-01 MED ORDER — FUROSEMIDE 40 MG PO TABS: 40.0000 mg | ORAL_TABLET | Freq: Two times a day (BID) | ORAL | 0 refills | Status: AC

## 2020-11-01 MED ORDER — AZITHROMYCIN 500 MG PO TABS: 500.0000 mg | ORAL_TABLET | Freq: Every day | ORAL | 0 refills | Status: AC

## 2020-11-01 MED ORDER — PREDNISONE 10 MG (21) PO TBPK: ORAL_TABLET | ORAL | 0 refills | Status: DC

## 2020-11-01 MED ORDER — POLYETHYLENE GLYCOL 3350 17 G PO PACK: 17.0000 g | PACK | Freq: Every day | ORAL | 0 refills | Status: DC | PRN

## 2020-11-01 MED ORDER — IPRATROPIUM-ALBUTEROL 0.5-2.5 (3) MG/3ML IN SOLN: 3.0000 mL | Freq: Four times a day (QID) | RESPIRATORY_TRACT | 0 refills | Status: AC | PRN

## 2020-11-01 MED ORDER — GUAIFENESIN ER 600 MG PO TB12: 600.0000 mg | ORAL_TABLET | Freq: Two times a day (BID) | ORAL | 0 refills | Status: AC

## 2020-11-01 NOTE — Progress Notes (Signed)
SATURATION QUALIFICATIONS: (This note is used to comply with regulatory documentation for home oxygen)    Patient Saturations on 2 Liters of oxygen while Ambulating = 85%  Increased o2 to 4L and oxygen levels rose to 91% while ambulating.

## 2020-11-01 NOTE — TOC Transition Note (Signed)
Transition of Care [facility]) - CM/SW Discharge Note   Patient Details  Name: Adren Dollins. MRN: [id number] Date of Birth: May 16, 1966  [facility]) CM/SW Contact:  Lanier Clam, RN Phone Number: 11/01/2020, 12:46 PM   Clinical Narrative:Spoke to patient about home 02-[employer] rep Ian Malkin aware to deliver travel home 02 tank to rm prior d/c. Patient had concerns about empty home 02 tanks for pick up-[employer] rep Ian Malkin will f/u with patient. Patient has own transport home.No further CM needs.       Final next level of care: Home/Self Care Barriers to Discharge: No Barriers Identified   Patient Goals and CMS Choice        Discharge Placement                       Discharge Plan and Services   Discharge Planning Services: CM Consult                                 Social Determinants of Health (SDOH) Interventions     Readmission Risk Interventions No flowsheet data found.

## 2020-11-01 NOTE — Care Management Important Message (Signed)
Important Message  Patient Details IM Letter given to the Patient. Name: Chris [name]. MRN: [id number] Date of Birth: [birth year]   Medicare Important Message Given:  Yes     Caren Macadam 11/01/2020, 11:04 AM

## 2020-11-01 NOTE — Progress Notes (Signed)
Went over discharge papers with patient.  All questions answered.  VSS.  Pt received portable o2 tank to d/c home.  Wheeled out by NT.

## 2020-11-02 ENCOUNTER — Telehealth: Payer: Self-pay | Admitting: Internal Medicine

## 2020-11-02 DIAGNOSIS — J449 Chronic obstructive pulmonary disease, unspecified: Secondary | ICD-10-CM

## 2020-11-02 NOTE — Telephone Encounter (Signed)
I called and spoke with pt and he is aware that the order for the nebulizer has been sent to the DME.  He will keep the appt with MW on 03/28 for HFU since he needs to have labs done.

## 2020-11-03 LAB — HIV-1 RNA, PCR (GRAPH) RFX/GENO EDI
HIV-1 RNA BY PCR: <20 copies/mL
HIV-1 RNA Quant, Log: UNDETERMINED log10copy/mL

## 2020-11-05 ENCOUNTER — Ambulatory Visit (INDEPENDENT_AMBULATORY_CARE_PROVIDER_SITE_OTHER): Payer: Medicare HMO | Admitting: Internal Medicine

## 2020-11-05 ENCOUNTER — Other Ambulatory Visit: Payer: Self-pay | Admitting: Internal Medicine

## 2020-11-05 ENCOUNTER — Other Ambulatory Visit: Payer: Self-pay

## 2020-11-05 ENCOUNTER — Encounter: Payer: Self-pay | Admitting: Internal Medicine

## 2020-11-05 DIAGNOSIS — J9612 Chronic respiratory failure with hypercapnia: Secondary | ICD-10-CM

## 2020-11-05 DIAGNOSIS — F17210 Nicotine dependence, cigarettes, uncomplicated: Secondary | ICD-10-CM | POA: Diagnosis not present

## 2020-11-05 DIAGNOSIS — J449 Chronic obstructive pulmonary disease, unspecified: Secondary | ICD-10-CM

## 2020-11-05 DIAGNOSIS — J9611 Chronic respiratory failure with hypoxia: Secondary | ICD-10-CM

## 2020-11-05 DIAGNOSIS — R69 Illness, unspecified: Secondary | ICD-10-CM | POA: Diagnosis not present

## 2020-11-05 DIAGNOSIS — G4733 Obstructive sleep apnea (adult) (pediatric): Secondary | ICD-10-CM | POA: Diagnosis not present

## 2020-11-05 MED ORDER — BREZTRI AEROSPHERE 160-9-4.8 MCG/ACT IN AERO: 2.0000 | INHALATION_SPRAY | Freq: Two times a day (BID) | RESPIRATORY_TRACT | 11 refills | Status: DC

## 2020-11-05 MED ORDER — BREZTRI AEROSPHERE 160-9-4.8 MCG/ACT IN AERO: 2.0000 | INHALATION_SPRAY | Freq: Two times a day (BID) | RESPIRATORY_TRACT | 0 refills | Status: DC

## 2020-11-05 NOTE — Assessment & Plan Note (Signed)
Active smoker - 04/28/2013 PFT's FEV1 1.21 (39%) ratio 59 and 13% better p B2 and dlco 72 corrects to 102  - started spiriva 04/30/2013 > changed to respimat 02/28/2014 -Med calendar 03/14/2014 > not using as of 05/16/14  - arrived on stiolto / symbicort 02/29/2016 > changed to symbicort 80/spiriva (lower dose ICS due to interaction with aids meds  - PFT's  06/17/2016  FEV1 1.29 (42 % ) ratio 64  p 43 % improvement from saba p symb 80 /spiriva prior to study with DLCO  51/49c % corrects to 86  % for alv volume   - 06/17/2016   change symb to 160 2bid  - 09/25/2016  After extensive coaching device  effectiveness =    90% with dpi/ elipta > changed to Incruse per Insurance restrictions > preferred spiriva - 12/11/2016   changed to spiriva respimat  08/05/2017  After extensive coaching HFA effectiveness =  90%    - 09/22/2017 changed to symb 80 2bid with pseudowheeze and thrush  - PFT's  03/22/2018  FEV1 1.34 (45 % ) ratio 63  p 17 % improvement from saba p sym 160/ incruse prior to study with DLCO  36/41 % corrects to 72  % for alv volume   - trial of bevespi 2bid 05/17/2018 >>> improved as of 10/04/2018  - 11/17/2019  After extensive coaching inhaler device,  effectiveness =    90% >  Try breztri> bad thrush so changed back to bevespi - 10/08/2020  After extensive coaching inhaler device,  effectiveness =    75% try back on breztri as continues to req freq pred  - 11/05/2020  After extensive coaching inhaler device,  effectiveness =    90% > continue breztri 2bid    Group D in terms of symptom/risk and laba/lama/ICS  therefore appropriate rx at this point >>>  Continue breztri and approp saba.  Re saba: I spent extra time with pt today reviewing appropriate use of albuterol for prn use on exertion with the following points: 1) saba is for relief of sob that does not improve by walking a slower pace or resting but rather if the pt does not improve after trying this first. 2) If the pt is convinced, as many are, that  saba helps recover from activity faster then it's easy to tell if this is the case by re-challenging : ie stop, take the inhaler, then p 5 minutes try the exact same activity (intensity of workload) that just caused the symptoms and see if they are substantially diminished or not after saba 3) if there is an activity that reproducibly causes the symptoms, try the saba 15 min before the activity on alternate days   If in fact the saba really does help, then fine to continue to use it prn but advised may need to look closer at the maintenance regimen being used to achieve better control of airways disease with exertion.

## 2020-11-05 NOTE — Assessment & Plan Note (Signed)
HCO3 31 01/17/16 - 01/05/2015  Walked RA x 3 laps @ 185 ft each stopped due to  Leg pain and fatibue, no sob , nl pace, no desat  -  12/11/2016 Patient Saturations on Room Air at Rest = 88%----increased 98% 2lpm continuous - HC03  24    03/02/18  - 03/22/2018  Walked RA x 3 laps @ 185 ft each stopped due to  End of study, moderate pace, no   desat  / min sob - 05/17/2018  Walked 2lpm x 3 laps @ 185 ft each stopped due to  End of study, no desat   - HCO3  03/02/19  = 21  - HC03  09/22/19  = 23  - 12/06/2019   83% at rest RA pt started on 2lpm contuous o2 and sats increased to 91%-- pt walked 2 laps at average pace and sats at end 93% 2lpm  - HC03  11/01/20  = 27   As of 11/05/2020   On 3lpm 24/7 but advised: Make sure you check your oxygen saturation  at your highest level of activity  to be sure it stays over 90% and adjust  02 flow upward to maintain this level if needed but remember to turn it back to previous settings when you stop (to conserve your supply).

## 2020-11-05 NOTE — Patient Instructions (Addendum)
Continue Breztri and let me know if you can't afford it on your insurance.  Try albuterol 15 min before an activity that you know would make you short of breath and see if it makes any difference and if makes none then don't take it after activity unless you can't catch your breath.   Make sure you check your oxygen saturation  at your highest level of activity  to be sure it stays over 90% and adjust  02 flow upward to maintain this level if needed but remember to turn it back to previous settings when you stop (to conserve your supply).   The key is to stop smoking completely before smoking completely stops you!    Pulmonary follow up here is as needed

## 2020-11-05 NOTE — Progress Notes (Signed)
Subjective:    Patient ID: Chris [name], male    DOB: 09-19-1965   MRN: [id number]    Brief patient profile:  77 yobm active smoker with HIV  and doe x 2009 much worse since March 2014 so referred by [name] to pulmonary clinic 02/15/2013 with GOLD III copd by pfts 04/28/13.    History of Present Illness  02/15/2013 1st pulmonary eval  Cc indolent onset progressive doe x walking slow pace more than 100 ft,  not at rest., 02 dep, doe much worse x 3 months assoc with congested cough esp in am with sev tbsp thick white mucus takes about 30 min to clear.  No better on inhalers tried to date including advair and spiriva - albuterol works the best and using lots of saba and nebs rec Stop advair and spiriva Plan A = Automatic = Start symbicort 160 Take 2 puffs first thing in am and then another 2 puffs about 12 hours later.  Plan B = backup= Only use your albuterol (as a rescue medication to be used if you can't catch your breath by resting or doing a relaxed purse lip breathing pattern. The less you use it, the better it will work when you need it. Ok to use up to 2 puffs every 4 hours Plan C = nebulizer, use this only if plan B doesn't work ok to use up to 4 hours       05/26/2017  Acute  ov/[name] re:   COPD III/ symb/spiriva rare saba / 02 2pm hs then during day prn  Chief Complaint  Patient presents with  . Acute Visit    Increased DOE x 3 days.   most days gets up 6 am with sob/some congestion > symb x 2 and then spiriva and saba and maybe once a week with saba hfa / did not bring it with him  Then worse gradually x 3 days, waking up 4am not using rescue then  Not much cough-   does not bother sleep until 4 am but even then minimal vol of mucus/ white  Needing 02 2lpm 24/7 now  On PPI bid ac  rec Plan A = Automatic =  symbicort 160 Take 2 puffs first thing in am and then another 2 puffs about 12 hours later.                                       spiriva 2 puffs  Work on inhaler  technique:   Plan B = Backup Only use your albuterol as a rescue medication Plan C = Crisis - only use your albuterol nebulizer if you first try Plan B The key is to stop smoking completely before smoking completely stops you!  Prednisone 10 mg take  4 each am x 2 days,   2 each am x 2 days,  1 each am x 2 days and stop  Please schedule a follow up visit in 3 months but call sooner if needed    Admit date: 07/21/2017 Discharge date: 07/28/2017  Time spent: 35 minutes  Recommendations for Outpatient Follow-up:  1. Repeat basic metabolic panel to follow electrolytes and renal function 2. Patient needs outpatient sleep study to sit up and determine candidacy for CPAP. 3. Reassess volume status and blood pressure, adjust medications as needed. 4. Close follow-up to patient's CBG and A1c; further adjust hypoglycemic regimen as indicated.  Discharge  Diagnoses:  Active Problems:   HIV disease ([place])   COPD  GOLD III, still smoking   Steroid-induced hyperglycemia   Chronic respiratory failure with hypoxia and hypercapnia (HCC)   DM (diabetes mellitus) (HCC)   COPD exacerbation (HCC)   Obstructive sleep apnea syndrome   Essential hypertension, benign   Atopic eczema   Type 2 diabetes mellitus with complication (HCC)   Morbid (severe) obesity due to excess calories ([place])   COPD with acute exacerbation (HCC)   Depression   GERD (gastroesophageal reflux disease)   Acute on chronic respiratory failure with hypoxia ([place])    08/05/2017  Post hosp  f/u ov/[name] re:  p copd exac - did not follow action plan reviewed at last ov prior to ER Chief Complaint  Patient presents with  . Follow-up    hospital f/u- [facility], D/C last week with COPD, using 3L , still has some SOB  with 02    -  MMRC2 = can't walk a nl pace on a flat grade s sob but does fine slow and flat  Using 02 as needed daytime and 2lpm hs  rec Work on inhaler technique:   Plan A = Automatic =  symbicort 160 Take 2 puffs  first thing in am and then another 2 puffs about 12 hours later. spiriva 2 puffs in am only  Plan B = Backup Only use your albuterol as a rescue medication  Plan C = Crisis - only use your albuterol nebulizer if you first try Plan B and it fails to help > ok to use the nebulizer up to every 4 hours but if start needing it regularly call for immediate appointment The key is to stop smoking completely before smoking completely stops you!  Keep your follow up appt but bring all inhalers and nebulizer solutions with you to this visit    09/22/2017  f/u ov/[name] re: still smoking/ did not bring meds as rec/ saba hfa maybe twice  A week  On maint symb 160/spiriva  Chief Complaint  Patient presents with  . Follow-up    Pt c/o chest tightness on the left off and on for the past 3 wks.   Dyspnea:  Better than usual able to do [employer] slow pace = MMRC2 = can't walk a nl pace on a flat grade s sob but does fine slow and flat  Cough: minimal/ mostly am mucoid Sleep: on 2lpm on side and 2 pillows ok  rec The key is to stop smoking completely before smoking completely stops you!  Ok to try symb 80 Take 2 puffs first thing in am and then another 2 puffs about 12 hours later to see if breathing is as good For thrush> clotrimazole troche 10 mg up to 4 x daily as needed     05/17/2018  f/u ov/[name] re:  GOLD III/ 02 dep / still smoking Chief Complaint  Patient presents with  . Follow-up    Breathing is overall doing well. He is using his albuterol inhaler once daily on average.   Dyspnea:  MMRC3 = can't walk 100 yards even at a slow pace at a flat grade s stopping due to sob even on 3lpm  Cough: min productions Sleeping: flat / on side with one pillow and  SABA maybe once a day when over does it  02: use:2lpm 24/7 except sometimes turns up with walking to 3lpm   rec No change medications for now - at next refills stop the symbicort and  incruse and change to BEVESPI Take 2 puffs first thing in am and  then another 2 puffs about 12 hours later to see if helps breathing and voice     12/06/2019  f/u ov/[name] re: GOLD III/ still smoking/ not using 02 approp on besvespi 2bid Chief Complaint  Patient presents with  . Follow-up    Breathing had improved some- he liked taking the breztri but states it made his throat sore. He is using his albuterol inhaler 2 x per day on average.   Dyspnea:  One aisle at [employer] leaning on cart s 02  = MMRC3 = can't walk 100 yards even at a slow pace at a flat grade s stopping due to sob   Cough: no Sleeping: on side/ can't lie on back s choking > better on 2 pillows> canceled split night study  SABA use: as above  02: 2lpm prn based on sob / not checking sats  rec Wear your 02 2lpm 24/7  Clotrimazole troche 5 x daily until gone  Stop losartan Continue bevespi  Take 2 puffs first thing in am and then another 2 puffs about 12 hours later.  Only use your albuterol as a rescue medication to be used if you can't catch your breath by resting or doing a relaxed purse lip breathing pattern.  - The less you use it, the better it will work when you need it. - Ok to use up to 2 puffs  every 4 hours if you must We are referring you to Dr Ander Slade for sleep evaluation and ok to follow up with him or me after he completes your evaluation     10/08/2020  f/u ov/[name] re: COPD III  Still smoking  Chief Complaint  Patient presents with  . Follow-up    SOB with exertion  Dyspnea: shops leaning on cart at [employer] x 3 aisles  Cough: some rattling  Sleeping: bed is flat/ 2 pillows SABA use: avg twice daily  02: 3lpm 24/7    Covid status:  2 vaccinations and also had covid January 2022 rec Set up a follow up [facility] to discuss your sleep apnea The key is to stop smoking completely before smoking completely stops you! I very strongly recommend you get the moderna or pfizer vaccine (booster) as soon as possible Plan A = Automatic = Always=    Breztri Take 2 puffs first thing  in am and then another 2 puffs about 12 hours later.  Work on inhaler technique:   Plan B = Backup (to supplement plan A, not to replace it) Only use your albuterol inhaler as a rescue medication Prednisone 10 mg take  4 each am x 2 days,   2 each am x 2 days,  1 each am x 2 days and stop   Please schedule a follow up office visit in 4 weeks, sooner if needed  with all medications /inhalers/ solutions in hand so we can verify exactly what you are taking. This includes all medications from all doctors and over the counters    Admit date: 10/29/2020 Discharge date: 11/01/2020 Brief/Interim Summary: The patient is a 55 year old obese African-American male who is oxygen dependent on COPD at 2 L, has a history of diabetes mellitus type 2, hypertension, chronic diastolic CHF, atrial fibrillation as well as other comorbidities who presented to the hospital with progressive shortness of breath. X-ray did not show any evidence of pneumonia but he is noted to have significant wheezing and was felt to  have a COPD exacerbation and was admitted for further treatments with steroids, bronchodilators and antibiotics. Patient is steadily improving however he still requiring significant amount of oxygen and desaturated significantly on ambulatory home O2 screen yesterday.  Today he improved after an additional day of hospitalization after he received IV steroids.  His respiratory status is improved significantly and he desaturated but was able to maintain his saturations on 4 L.  He is stable to be discharged and will follow up with his PCP and pulmonologist within 1 to 2 weeks.  He was resumed on Lasix and advised to follow-up with cardiology as well.  Discharge Diagnoses:    Acute and chronic respiratory failure with hypoxia    Cigarette smoker   Essential hypertension, benign   COPD with acute exacerbation (HCC)   Diabetes mellitus type 2 in obese ([place])   HIV (human immunodeficiency virus infection) ([place])    Atrial fibrillation with RVR ([place])  Acute onChronicRespiratoryFailure withHypoxiain the setting of Acute Exacerbation of COPD with likely mild Acute on Chronic Diastolic CHF -At baseline, patient wears 2 L of oxygen -SpO2: 92 % O2 Flow Rate (L/min): 4 L/min; Desaturated significantly yesterday on Home O2 Screen and was requiring 6 Liters of Supplemental O2 via [place] to maintain a Saturation of 85% but today he improved significantly -D-dimer is negative -Continuous pulse oximetry maintain O2 saturations greater than 90% -Supplemental oxygen via nasal cannula and wean O2 to home requirements -Continue with albuterol nebs every 2 as needed shortness of breath, Brovana 15 mcg neb twice daily, budesonide 0.25 mg nebs twice daily, DuoNeb 3 mL every 6 hours scheduled, guaifenesin 1200 g p.o. twice daily -Patient is being continued on Incruse Ellipta 1 puff IH daily as well -Continue with steroids with methylprednisolone 60 mg IV every 6 and wean -Patient given azithromycin -Patient did not desaturate on repeat home evaluation -Repeat chest x-ray in 3 to 6 weeks and follow-up with pulmonary within a week and his appointment [name] on 10/28/2020  Acute Exacerbation ofCOPD  -Continue on intravenous steroids since he is still wheezing and short of breath -Continue inhaled steroids, LABA, SABAas above -Continue on azithromycin -See treatment above -Follow-up with pulmonary in outpatient setting within 1 to 2 weeks  ParoxysmalAtrialFibrillation -Anticoagulated with Eliquis -Continue ondiltiazem to 40 mg p.o. daily -Repeat EKG to monitor heart rate -Currently being monitored in the progressive unit -Follow-up in outpatient setting follow-up with cardiology in outpatient setting    Acute onChronicDiastolicCongestiveHeartFailure -Appearsmildly volume overloaded -BNP was elevated on Admission and was 102 -Give a dose of IV Lasix today given that his BUN/creatinine went  from15/1.25 -> 28/1.72 -> 42/1.46 today it is 42/1.39 -Strict I's and O's and Daily Weights -He was given an additional dose of IV Lasix prior to discharge next-we will need to continue monitor for signs and symptoms of iron overload and will resume his home Lasix dosing as he is no longer been taking it  GERD -Continue withPantoprazole 40 mils p.o. daily  AKIon CKD stage II, improving -Patient'sBUN/creatinine went from15/1.25 -> 28/1.72 -> 42/1.46 and today was 42/1.39 -Avoid nephrotoxic medications, contrast dyes, hypotension and renally adjust medications -Continue to monitor and trend and repeat CMP in a.m.      11/05/2020  f/u ov/[name] re: COPD III/ still smoking/ brough  no meds, just inhalers/ post hosp d/c 11/01/20  Chief Complaint  Patient presents with  . Follow-up    SOB  Dyspnea: back at [facility] leaning on cart on 3lpm contine Cough: none  Sleeping: 45 degrees with pillows > does fine on 3lpm /says can't tol cpap mask and has need f/u with [facility] as per prev recs   SABA use: 3-4 x day  02: 3lpm 24/7 Covid status:   vax x 2 / sp covid infection Jan 22    No obvious day to day or daytime variability or assoc excess/ purulent sputum or mucus plugs or hemoptysis or cp or chest tightness, subjective wheeze or overt sinus or hb symptoms.   Sleeping  without nocturnal  or early am exacerbation  of respiratory  c/o's or need for noct saba. Also denies any obvious fluctuation of symptoms with weather or environmental changes or other aggravating or alleviating factors except as outlined above   No unusual exposure hx or h/o childhood pna/ asthma or knowledge of premature birth.  Current Allergies, Complete Past Medical History, Past Surgical History, Family History, and Social History were reviewed in [employer] record.  ROS  The following are not active complaints unless bolded Hoarseness, sore throat, dysphagia, dental problems, itching, sneezing,   nasal congestion or discharge of excess mucus or purulent secretions, ear ache,   fever, chills, sweats, unintended wt loss or wt gain, classically pleuritic or exertional cp,  orthopnea pnd or arm/hand swelling  or leg swelling, presyncope, palpitations, abdominal pain, anorexia, nausea, vomiting, diarrhea  or change in bowel habits or change in bladder habits, change in stools or change in urine, dysuria, hematuria,  rash, arthralgias, visual complaints, headache, numbness, weakness or ataxia or problems with walking or coordination,  change in mood or  memory.        Current Meds - only  Brought inhalers   Medication Sig  . acetaminophen-codeine (TYLENOL #3) 300-30 MG tablet Take 1-2 tablets by mouth every 12 (twelve) hours as needed for moderate pain.  [name] albuterol (VENTOLIN HFA) 108 (90 Base) MCG/ACT inhaler TAKE 2 PUFFS BY MOUTH EVERY 6 HOURS AS NEEDED FOR WHEEZE OR SHORTNESS OF BREATH (Patient taking differently: Inhale 2 puffs into the lungs as needed for wheezing or shortness of breath.)  . apixaban (ELIQUIS) 5 MG TABS tablet Take 1 tablet (5 mg total) by mouth 2 (two) times daily.  . bictegravir-emtricitabine-tenofovir AF (BIKTARVY) 50-200-25 MG TABS tablet Take 1 tablet by mouth daily.  . Blood Glucose Monitoring Suppl (ONETOUCH VERIO) w/Device KIT 1 kit by Does not apply route 2 (two) times daily.  . Budeson-Glycopyrrol-Formoterol (BREZTRI AEROSPHERE) 160-9-4.8 MCG/ACT AERO Inhale 2 puffs into the lungs 2 (two) times daily.  [name] diltiazem (TIAZAC) 240 MG 24 hr capsule Take 240 mg by mouth daily.  . fluticasone (FLONASE) 50 MCG/ACT nasal spray Place 2 sprays into both nostrils daily.  . furosemide (LASIX) 40 MG tablet Take 1 tablet (40 mg total) by mouth 2 (two) times daily.  [name] glucose blood (ONETOUCH VERIO) test strip Use as instructed. Check blood glucose level by fingerstick twice per day. E11.65  . guaiFENesin (MUCINEX) 600 MG 12 hr tablet Take 1 tablet (600 mg total) by mouth 2 (two)  times daily for 5 days.  . Insulin Pen Needle (B-D UF III MINI PEN NEEDLES) 31G X 5 MM MISC Use as instructed. Inject into the skin once daily E11.65  . ipratropium-albuterol (DUONEB) 0.5-2.5 (3) MG/3ML SOLN Take 3 mLs by nebulization every 6 (six) hours as needed.  [name] losartan (COZAAR) 25 MG tablet Take 0.5 tablets (12.5 mg total) by mouth daily. Must have office visit for refills  . Misc. Devices MISC Please  provide patient with insurance approved portable O2 concentrator and supplies J96.01, J44.9  . omeprazole (PRILOSEC) 40 MG capsule TAKE 1 CAPSULE (40 MG TOTAL) BY MOUTH IN THE MORNING. (Patient taking differently: Take 40 mg by mouth daily.)  . OneTouch Delica Lancets 07P MISC Use as instructed. Check blood glucose level by fingerstick twice per day. E11.65  . OXYGEN Inhale 2 L into the lungs.  . polyethylene glycol (MIRALAX / GLYCOLAX) 17 g packet Take 17 g by mouth daily as needed for mild constipation.  . predniSONE (STERAPRED UNI-PAK 21 TAB) 10 MG (21) TBPK tablet Take 6 pills on day 1, 5 pills on day 2, 4 pills on day 3, 3 pills on day 4, 2 pills on day 5, 1 pill on day 6 and stop  . sildenafil (VIAGRA) 100 MG tablet Take 1 tablet (100 mg total) by mouth daily as needed for erectile dysfunction.  . Vitamin D, Ergocalciferol, (DRISDOL) 1.25 MG (50000 UNIT) CAPS capsule Take 1 capsule (50,000 Units total) by mouth every 7 (seven) days.                                    Objective:   Physical Exam     11/05/2020      249 10/08/2020      254 12/06/2019      246  11/17/2019        257  06/10/2019    259   10/04/2018     247  01/05/2015       278 >  02/29/2016  274 > 06/17/2016 277 > 09/25/2016   258  > 12/11/2016   270  > 05/26/2017  270 > 08/05/2017  255  > 09/22/2017   262 > 03/22/2018   244 > 04/29/2018  258 > 05/17/2018  253  01/28/2016        272   05/16/14 285 lb 6.4 oz (129.457 kg)  04/20/14 282 lb 12.8 oz (128.277 kg)  03/14/14 274 lb 3.2 oz (124.376 kg)        Vital signs  reviewed  11/05/2020  - Note at rest 02 sats  96% on 3lpm   General appearance:    Chronically ill obese bm nad    HEENT : pt wearing mask not removed for exam due to covid - 19 concerns.    NECK :  without JVD/Nodes/TM/ nl carotid upstrokes bilaterally   LUNGS: no acc muscle use,  Mild barrel  contour chest wall with bilateral  Distant bs s audible wheeze and  without cough on insp or exp maneuvers  and mild  Hyperresonant  to  percussion bilaterally     CV:  RRR  no s3 or murmur or increase in P2, and trace bilateral LE edema   ABD: massively obese but soft and nontender with pos end  insp Hoover's  in the supine position. No bruits or organomegaly appreciated, bowel sounds nl  MS:   Nl gait/  ext warm without deformities, calf tenderness, cyanosis or clubbing No obvious joint restrictions   SKIN: warm and dry without lesions    NEURO:  alert, approp, nl sensorium with  no motor or cerebellar deficits apparent.            I personally reviewed images and agree with radiology impression as follows:  CXR:   portabel 10/29/20 Stable cardiomegaly. No pneumothorax or pleural effusion is noted. Both lungs are  clear. The visualized skeletal structures are unremarkable.      Assessment & Plan:

## 2020-11-05 NOTE — Assessment & Plan Note (Signed)
Referred to Dr Wynona Neat 11/05/2020   Says can't wear mask due to fit but has not been seen by Sleep medicine yet > referred.         Each maintenance medication was reviewed in detail including emphasizing most importantly the difference between maintenance and prns and under what circumstances the prns are to be triggered using an action plan format where appropriate.  Total time for H and P, chart review, counseling, reviewing hfa device(s) and generating customized AVS unique to this office visit / same day charting = 32 min

## 2020-11-05 NOTE — Assessment & Plan Note (Addendum)
Counseled re importance of smoking cessation but did not meet time criteria for separate billing   °

## 2020-11-14 ENCOUNTER — Telehealth: Payer: Self-pay | Admitting: Nurse Practitioner

## 2020-11-14 DIAGNOSIS — J449 Chronic obstructive pulmonary disease, unspecified: Secondary | ICD-10-CM | POA: Diagnosis not present

## 2020-11-14 DIAGNOSIS — J129 Viral pneumonia, unspecified: Secondary | ICD-10-CM | POA: Diagnosis not present

## 2020-11-14 NOTE — Telephone Encounter (Unsigned)
Copied from CRM [id number]. Topic: Quick Communication - Rx Refill/Question >> Nov 14, 2020  4:41 PM Mcneil, Ja-Kwan wrote: Medication: acetaminophen-codeine (TYLENOL #3) 300-30 MG tablet  Has the patient contacted their pharmacy? yes pt told to contact provider  Preferred Pharmacy (with phone number or street name): [id number] - [place], [place] - [address]  Phone: [phone]   Fax: [phone]  Agent: Please be advised that RX refills may take up to 3 business days. We ask that you follow-up with your pharmacy.

## 2020-11-14 NOTE — Telephone Encounter (Signed)
Patient requesting expired medication , tylenol #3. Expired 03/29/20. Please advise .

## 2020-11-15 ENCOUNTER — Other Ambulatory Visit: Payer: Self-pay | Admitting: Nurse Practitioner

## 2020-11-15 MED ORDER — ACETAMINOPHEN-CODEINE #3 300-30 MG PO TABS: 1.0000 | ORAL_TABLET | Freq: Three times a day (TID) | ORAL | 0 refills | Status: AC | PRN

## 2020-11-27 ENCOUNTER — Ambulatory Visit: Payer: Medicare HMO | Admitting: Family

## 2020-12-09 DIAGNOSIS — J9610 Chronic respiratory failure, unspecified whether with hypoxia or hypercapnia: Secondary | ICD-10-CM | POA: Diagnosis not present

## 2020-12-09 DIAGNOSIS — J9611 Chronic respiratory failure with hypoxia: Secondary | ICD-10-CM | POA: Diagnosis not present

## 2020-12-09 DIAGNOSIS — J449 Chronic obstructive pulmonary disease, unspecified: Secondary | ICD-10-CM | POA: Diagnosis not present

## 2020-12-09 DIAGNOSIS — J129 Viral pneumonia, unspecified: Secondary | ICD-10-CM | POA: Diagnosis not present

## 2020-12-14 DIAGNOSIS — J449 Chronic obstructive pulmonary disease, unspecified: Secondary | ICD-10-CM | POA: Diagnosis not present

## 2020-12-14 DIAGNOSIS — J129 Viral pneumonia, unspecified: Secondary | ICD-10-CM | POA: Diagnosis not present

## 2020-12-17 ENCOUNTER — Other Ambulatory Visit: Payer: Self-pay | Admitting: Nurse Practitioner

## 2020-12-17 ENCOUNTER — Telehealth: Payer: Self-pay | Admitting: Nurse Practitioner

## 2020-12-17 MED ORDER — ACETAMINOPHEN-CODEINE #3 300-30 MG PO TABS
1.0000 | ORAL_TABLET | Freq: Two times a day (BID) | ORAL | 0 refills | Status: AC | PRN
Start: 2020-12-17 — End: 2021-01-16

## 2020-12-17 NOTE — Telephone Encounter (Signed)
Medication  sent 5-9

## 2020-12-17 NOTE — Telephone Encounter (Signed)
Pt called and stated he has sent a mychart request for refill two weeks ago and is in pain/ pt would like a refill today for acetaminophen-codeine (TYLENOL #3) 300-30 MG tablet [facility] to [id number] - [place], [place] - [address]  [address] [place], [place] [address]  Phone:  [phone] Fax:  [phone]

## 2020-12-17 NOTE — Telephone Encounter (Signed)
Please follow up

## 2020-12-20 ENCOUNTER — Telehealth: Payer: Self-pay | Admitting: Internal Medicine

## 2020-12-20 MED ORDER — BEVESPI AEROSPHERE 9-4.8 MCG/ACT IN AERO: 2.0000 | INHALATION_SPRAY | Freq: Two times a day (BID) | RESPIRATORY_TRACT | 11 refills | Status: DC

## 2020-12-20 NOTE — Telephone Encounter (Signed)
Called and spoke with patient. He stated that he had been started on Breztri but his insurance will not pay for it. He was then switched over to Trelegy back in March 2022. He has not noticed a difference with the Trelegy. He is requesting to go back on the [place].  [facility] [address]   MW, please advise if you are ok with this. Thanks.

## 2020-12-20 NOTE — Telephone Encounter (Signed)
Ok to resume bevespi 2bid though the trelegy has the advantage of preventing flares(like adding a cholesterol pill, you can't feel it working )  whereas bevespi doesn't so if he doesn't notice a difference in how he feels and price is the same then I prefer he stay on trelegy

## 2020-12-20 NOTE — Telephone Encounter (Signed)
Called and spoke with patient to discuss inhalers and Dr. Rolin Barry recommendations. Also asked the patient if he had done patient assistance for [facility] if he was interested in staying on it. He stated that he has done it and didn't qualify. Patient states he has not noticed a difference with the Trelegy and would like to go back to [place]. RX has been sent to preferred pharmacy. Nothing further needed at this time.

## 2021-01-01 ENCOUNTER — Ambulatory Visit: Payer: Medicare HMO | Admitting: Family

## 2021-01-02 ENCOUNTER — Other Ambulatory Visit: Payer: Self-pay | Admitting: Nurse Practitioner

## 2021-01-08 ENCOUNTER — Telehealth: Payer: Self-pay

## 2021-01-08 NOTE — Telephone Encounter (Signed)
Called patient to get appointment rescheduled, left a voicemail for patient to call back and get rescheduled

## 2021-01-14 ENCOUNTER — Telehealth: Payer: Self-pay | Admitting: Internal Medicine

## 2021-01-14 DIAGNOSIS — J129 Viral pneumonia, unspecified: Secondary | ICD-10-CM | POA: Diagnosis not present

## 2021-01-14 DIAGNOSIS — J449 Chronic obstructive pulmonary disease, unspecified: Secondary | ICD-10-CM | POA: Diagnosis not present

## 2021-01-14 MED ORDER — AZITHROMYCIN 250 MG PO TABS: ORAL_TABLET | ORAL | 0 refills | Status: DC

## 2021-01-14 MED ORDER — PREDNISONE 10 MG PO TABS: ORAL_TABLET | ORAL | 0 refills | Status: AC

## 2021-01-14 NOTE — Telephone Encounter (Signed)
Primary Pulmonologist: [name] Last office visit and with whom: 11/05/2020 [name] What do we see them for (pulmonary problems): COPD gold, smoker, chronic respiratory failure with hypoxia and hypercapnia Last OV assessment/plan:  Assessment & Plan Note by Nyoka Cowden, MD at 11/05/2020 2:19 PM  Author: Nyoka Cowden, MD Author Type: Physician Filed: 11/05/2020 2:19 PM  Note Status: Written Cosign: Cosign Not Required Encounter Date: 11/05/2020  Problem: Chronic respiratory failure with hypoxia and hypercapnia (HCC)  Editor: Nyoka Cowden, MD (Physician)                 HCO3 31 01/17/16 - 01/05/2015  Walked RA x 3 laps @ 185 ft each stopped due to  Leg pain and fatibue, no sob , nl pace, no desat  -  12/11/2016 Patient Saturations on Room Air at Rest = 88%----increased 98% 2lpm continuous - HC03  24    03/02/18  - 03/22/2018  Walked RA x 3 laps @ 185 ft each stopped due to  End of study, moderate pace, no   desat  / min sob - 05/17/2018  Walked 2lpm x 3 laps @ 185 ft each stopped due to  End of study, no desat   - HCO3  03/02/19  = 21  - HC03  09/22/19  = 23  - 12/06/2019   83% at rest RA pt started on 2lpm contuous o2 and sats increased to 91%-- pt walked 2 laps at average pace and sats at end 93% 2lpm  - HC03  11/01/20  = 27   As of 11/05/2020   On 3lpm 24/7 but advised: Make sure you check your oxygen saturation  at your highest level of activity  to be sure it stays over 90% and adjust  02 flow upward to maintain this level if needed but remember to turn it back to previous settings when you stop (to conserve your supply).        Assessment & Plan Note by Nyoka Cowden, MD at 11/05/2020 2:17 PM  Author: Nyoka Cowden, MD Author Type: Physician Filed: 11/05/2020 2:18 PM  Note Status: Written Cosign: Cosign Not Required Encounter Date: 11/05/2020  Problem: COPD GOLD III, still smoking  Editor: Nyoka Cowden, MD (Physician)               Active smoker - 04/28/2013 PFT's FEV1 1.21 (39%)  ratio 59 and 13% better p B2 and dlco 72 corrects to 102  - started spiriva 04/30/2013 > changed to respimat 02/28/2014 -Med calendar 03/14/2014 > not using as of 05/16/14  - arrived on stiolto / symbicort 02/29/2016 > changed to symbicort 80/spiriva (lower dose ICS due to interaction with aids meds  - PFT's  06/17/2016  FEV1 1.29 (42 % ) ratio 64  p 43 % improvement from saba p symb 80 /spiriva prior to study with DLCO  51/49c % corrects to 86  % for alv volume   - 06/17/2016   change symb to 160 2bid  - 09/25/2016  After extensive coaching device  effectiveness =    90% with dpi/ elipta > changed to Incruse per Insurance restrictions > preferred spiriva - 12/11/2016   changed to spiriva respimat  08/05/2017  After extensive coaching HFA effectiveness =  90%    - 09/22/2017 changed to symb 80 2bid with pseudowheeze and thrush  - PFT's  03/22/2018  FEV1 1.34 (45 % ) ratio 63  p 17 % improvement from saba p sym 160/ incruse prior  to study with DLCO  36/41 % corrects to 72  % for alv volume   - trial of bevespi 2bid 05/17/2018 >>> improved as of 10/04/2018  - 11/17/2019  After extensive coaching inhaler device,  effectiveness =    90% >  Try breztri> bad thrush so changed back to bevespi - 10/08/2020  After extensive coaching inhaler device,  effectiveness =    75% try back on breztri as continues to req freq pred  - 11/05/2020  After extensive coaching inhaler device,  effectiveness =    90% > continue breztri 2bid    Group D in terms of symptom/risk and laba/lama/ICS  therefore appropriate rx at this point >>>  Continue breztri and approp saba.  Re saba: I spent extra time with pt today reviewing appropriate use of albuterol for prn use on exertion with the following points: 1) saba is for relief of sob that does not improve by walking a slower pace or resting but rather if the pt does not improve after trying this first. 2) If the pt is convinced, as many are, that saba helps recover from activity faster then  it's easy to tell if this is the case by re-challenging : ie stop, take the inhaler, then p 5 minutes try the exact same activity (intensity of workload) that just caused the symptoms and see if they are substantially diminished or not after saba 3) if there is an activity that reproducibly causes the symptoms, try the saba 15 min before the activity on alternate days   If in fact the saba really does help, then fine to continue to use it prn but advised may need to look closer at the maintenance regimen being used to achieve better control of airways disease with exertion.           Patient Instructions by Nyoka Cowden, MD at 11/05/2020 10:30 AM  Author: Nyoka Cowden, MD Author Type: Physician Filed: 11/05/2020 11:08 AM  Note Status: Addendum Cosign: Cosign Not Required Encounter Date: 11/05/2020  Editor: Nyoka Cowden, MD (Physician)      Prior Versions: 1. Nyoka Cowden, MD (Physician) at 11/05/2020 11:06 AM - Addendum   2. Nyoka Cowden, MD (Physician) at 11/05/2020 11:04 AM - Signed    Continue Markus Daft and let me know if you can't afford it on your insurance.  Try albuterol 15 min before an activity that you know would make you short of breath and see if it makes any difference and if makes none then don't take it after activity unless you can't catch your breath.   Make sure you check your oxygen saturation  at your highest level of activity  to be sure it stays over 90% and adjust  02 flow upward to maintain this level if needed but remember to turn it back to previous settings when you stop (to conserve your supply).   The key is to stop smoking completely before smoking completely stops you!    Pulmonary follow up here is as needed                  Instructions  Continue Markus Daft and let me know if you can't afford it on your insurance.  Try albuterol 15 min before an activity that you know would make you short of breath and see if it makes any  difference and if makes none then don't take it after activity unless you can't catch your breath.   Make sure  you check your oxygen saturation  at your highest level of activity  to be sure it stays over 90% and adjust  02 flow upward to maintain this level if needed but remember to turn it back to previous settings when you stop (to conserve your supply).   The key is to stop smoking completely before smoking completely stops you!    Pulmonary follow up here is as needed        Reason for call: Cough with chest congestion with yellow mucous, sore throat and runny nose for the  Past 3 days.  Denies any fever, chills or body aches.  Grandson/wife sick and are better.  Not recently covid tested.  Using duoneb and helps with a little while, using Bevespi.  Using nothing over the counter for cough because it never works.  Wears oxygen at 2L usually, turned up to 3L and this helps wile he is at rest, but he gets very sob and tired when he walks from his home to his car.  He has not recently been tested for covid, advised to do a home covid test and once we hear back from Dr. Sherene Sires we will call him back with his recommendations.  He verbalized understanding.  Dr. Sherene Sires,  Please advise.  Thank you.  (examples of things to ask: : When did symptoms start? Fever? Cough? Productive? Color to sputum? More sputum than usual? Wheezing? Have you needed increased oxygen? Are you taking your respiratory medications? What over the counter measures have you tried?)  Allergies  Allergen Reactions  . Bactrim [Sulfamethoxazole-Trimethoprim] Hives    Immunization History  Administered Date(s) Administered  . Hepatitis A, Adult 03/03/2013, 12/21/2014  . Influenza Split 03/11/2013  . Influenza Whole 05/11/2012  . Influenza,inj,Quad PF,6+ Mos 04/20/2014, 06/18/2015, 06/23/2016, 05/19/2017, 06/22/2018, 04/25/2019  . Meningococcal Mcv4o 11/13/2016, 06/22/2018  . PFIZER(Purple Top)SARS-COV-2 Vaccination  11/05/2019, 11/25/2019  . PPD Test 03/16/2013  . Pneumococcal Conjugate-13 03/03/2018  . Pneumococcal Polysaccharide-23 05/11/2012, 12/07/2018

## 2021-01-14 NOTE — Telephone Encounter (Signed)
Called and spoke with patient, advised of recommendations per Dr. Sherene Sires.  Advised to call us if he gets a positive covid test so we can prescribe something to lessen the symptoms.  He verbalized understanding.  Verified pharmacy and scripts sent.  Nothing further needed.

## 2021-01-14 NOTE — Telephone Encounter (Signed)
Should self test for covid if sick x 5 days yet  zpak Prednisone 10 mg take  4 each am x 2 days,   2 each am x 2 days,  1 each am x 2 days and stop

## 2021-01-21 ENCOUNTER — Telehealth: Payer: Self-pay

## 2021-01-21 NOTE — Telephone Encounter (Signed)
Patient voice mail is full, tried calling to reschedule a follow with [facility].

## 2021-01-22 ENCOUNTER — Other Ambulatory Visit: Payer: Self-pay | Admitting: Infectious Diseases

## 2021-01-22 DIAGNOSIS — B20 Human immunodeficiency virus [HIV] disease: Secondary | ICD-10-CM

## 2021-01-24 ENCOUNTER — Telehealth: Payer: Self-pay | Admitting: Nurse Practitioner

## 2021-01-24 NOTE — Telephone Encounter (Signed)
Copied from CRM [id number]. Topic: Quick Communication - Rx Refill/Question >> Jan 24, 2021 10:46 AM Jaquita Rector A wrote: Medication: acetaminophen-codeine (TYLENOL #3) 300-30 MG tablet  Has the patient contacted their pharmacy? No. (Agent: If no, request that the patient contact the pharmacy for the refill.) (Agent: If yes, when and what did the pharmacy advise?)  Preferred Pharmacy (with phone number or street name): [id number] - [place], [place] - [address] AT [address]  Phone:  [phone] Fax:  [phone]     Agent: Please be advised that RX refills may take up to 3 business days. We ask that you follow-up with your pharmacy.

## 2021-01-25 ENCOUNTER — Other Ambulatory Visit: Payer: Self-pay | Admitting: *Deleted

## 2021-01-25 ENCOUNTER — Other Ambulatory Visit: Payer: Medicare HMO | Admitting: *Deleted

## 2021-01-25 NOTE — Telephone Encounter (Signed)
Tylenol #3 will have to be approved by PCP.

## 2021-01-27 ENCOUNTER — Other Ambulatory Visit: Payer: Self-pay | Admitting: Nurse Practitioner

## 2021-01-27 MED ORDER — ACETAMINOPHEN-CODEINE #3 300-30 MG PO TABS: 1.0000 | ORAL_TABLET | Freq: Two times a day (BID) | ORAL | 0 refills | Status: DC | PRN

## 2021-01-28 ENCOUNTER — Encounter: Payer: Self-pay | Admitting: Family

## 2021-01-28 ENCOUNTER — Ambulatory Visit (INDEPENDENT_AMBULATORY_CARE_PROVIDER_SITE_OTHER): Payer: Medicare HMO | Admitting: Family

## 2021-01-28 ENCOUNTER — Other Ambulatory Visit: Payer: Self-pay

## 2021-01-28 ENCOUNTER — Other Ambulatory Visit: Payer: Self-pay | Admitting: *Deleted

## 2021-01-28 VITALS — BP 135/83 | HR 101 | Temp 98.0°F | Wt 246.0 lb

## 2021-01-28 DIAGNOSIS — Z113 Encounter for screening for infections with a predominantly sexual mode of transmission: Secondary | ICD-10-CM

## 2021-01-28 DIAGNOSIS — B370 Candidal stomatitis: Secondary | ICD-10-CM | POA: Diagnosis not present

## 2021-01-28 DIAGNOSIS — R69 Illness, unspecified: Secondary | ICD-10-CM | POA: Diagnosis not present

## 2021-01-28 DIAGNOSIS — B20 Human immunodeficiency virus [HIV] disease: Secondary | ICD-10-CM

## 2021-01-28 DIAGNOSIS — Z0000 Encounter for general adult medical examination without abnormal findings: Secondary | ICD-10-CM

## 2021-01-28 MED ORDER — BIKTARVY 50-200-25 MG PO TABS: 1.0000 | ORAL_TABLET | Freq: Every day | ORAL | 5 refills | Status: DC

## 2021-01-28 MED ORDER — SILDENAFIL CITRATE 100 MG PO TABS: 100.0000 mg | ORAL_TABLET | Freq: Every day | ORAL | 2 refills | Status: AC | PRN

## 2021-01-28 MED ORDER — FLUCONAZOLE 200 MG PO TABS: 200.0000 mg | ORAL_TABLET | Freq: Every day | ORAL | 0 refills | Status: DC

## 2021-01-28 NOTE — Assessment & Plan Note (Signed)
Chris [name] continues to have well-controlled HIV disease with good adherence and tolerance to his ART regimen of Biktarvy.  No signs/symptoms of opportunistic infection or progressive HIV.  Reviewed previous lab work and discussed plan of care.  Check blood work today.  Continue current dose of Biktarvy.  Plan for follow-up in 4 months or sooner if needed with lab work on the same day.

## 2021-01-28 NOTE — Assessment & Plan Note (Signed)
Chris [name] has acute onset thrush secondary to using his steroid inhalers and forgetting to wash out his mouth.  Not a severe case.  Start fluconazole.  Follow-up if symptoms worsen or do not improve.

## 2021-01-28 NOTE — Assessment & Plan Note (Signed)
   Discussed importance of safe sexual practice to reduce risk of STI.  Condoms declined.  Information for routine dental care provided in after visit summary to schedule appointment.  Routine vaccinations up-to-date per recommendations.

## 2021-01-28 NOTE — Patient Outreach (Signed)
[facility]) Care Management  01/28/2021  [name] Wienke Jr. 04-25-1966 [id number]  Telephone outreach per [employer] referral.  Talked with Mr. Busbin today and introduced [facility] Care Management. He sounded interested but did not want to give consent. Advised NP to send information and then call him back in 2 weeks. He is agreeable to this arrangement.  Zara Council. Burgess Estelle, MSN, [facility] Gerontological Nurse Practitioner [facility] Care Management [phone]
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
## 2021-01-28 NOTE — Patient Instructions (Signed)
Nice to see you.  We will check your lab work today.  Continue to take your medications daily.  Refills have been sent to the pharmacy.  Please call [facility]) to schedule/follow up on your dental care at [phone]  Plan for follow up in 4 months or sooner if needed with lab work 1-2 weeks prior to appointment or on same day.  Have a great day and stay safe!

## 2021-01-30 LAB — T-HELPER CELL (CD4) - (RCID CLINIC ONLY)
CD4 % Helper T Cell: 17 % — ABNORMAL LOW (ref 33–65)
CD4 T Cell Abs: 357 /uL — ABNORMAL LOW (ref 400–1790)

## 2021-01-31 ENCOUNTER — Other Ambulatory Visit: Payer: Self-pay | Admitting: Nurse Practitioner

## 2021-01-31 LAB — COMPLETE METABOLIC PANEL WITH GFR
AG Ratio: 1 (calc) (ref 1.0–2.5)
ALT: 14 U/L (ref 9–46)
AST: 12 U/L (ref 10–35)
Albumin: 3.9 g/dL (ref 3.6–5.1)
Alkaline phosphatase (APISO): 59 U/L (ref 35–144)
BUN: 12 mg/dL (ref 7–25)
CO2: 34 mmol/L — ABNORMAL HIGH (ref 20–32)
Calcium: 9.6 mg/dL (ref 8.6–10.3)
Chloride: 97 mmol/L — ABNORMAL LOW (ref 98–110)
Creat: 1.21 mg/dL (ref 0.70–1.33)
GFR, Est African American: 78 mL/min/{1.73_m2} (ref >=60)
GFR, Est Non African American: 67 mL/min/{1.73_m2} (ref >=60)
Globulin: 3.9 g/dL (calc) — ABNORMAL HIGH (ref 1.9–3.7)
Glucose, Bld: 263 mg/dL — ABNORMAL HIGH (ref 65–99)
Potassium: 4.5 mmol/L (ref 3.5–5.3)
Sodium: 136 mmol/L (ref 135–146)
Total Bilirubin: 0.4 mg/dL (ref 0.2–1.2)
Total Protein: 7.8 g/dL (ref 6.1–8.1)

## 2021-01-31 LAB — HIV-1 RNA QUANT-NO REFLEX-BLD
HIV 1 RNA Quant: NOT DETECTED Copies/mL
HIV-1 RNA Quant, Log: NOT DETECTED Log cps/mL

## 2021-01-31 LAB — RPR: RPR Ser Ql: NONREACTIVE

## 2021-01-31 NOTE — Telephone Encounter (Signed)
Requested medication (s) are due for refill today: no  Requested medication (s) are on the active medication list: yes   Last refill: 01/06/2021  Future visit scheduled: yes   Notes to clinic:  One inhaler should last at least one month. If the patient is requesting refills earlier, contact the patient to check for uncontrolled symptoms   Requested Prescriptions  Pending Prescriptions Disp Refills   albuterol (VENTOLIN HFA) 108 (90 Base) MCG/ACT inhaler [Pharmacy Med Name: ALBUTEROL HFA (VENTOLIN) INH] 18 each 2    Sig: TAKE 2 PUFFS BY MOUTH EVERY 6 HOURS AS NEEDED FOR WHEEZE OR SHORTNESS OF BREATH      Pulmonology:  Beta Agonists Failed - 01/31/2021 12:06 AM      Failed - One inhaler should last at least one month. If the patient is requesting refills earlier, contact the patient to check for uncontrolled symptoms.      Passed - Valid encounter within last 12 months    Recent Outpatient Visits           8 months ago Type 2 diabetes mellitus with hyperglycemia, without long-term current use of insulin [facility])   [facility] [place], [place] W, NP   9 months ago Type 2 diabetes mellitus with hyperglycemia, without long-term current use of insulin (HCC)   [facility] [place], [place], MD   1 year ago Type 2 diabetes mellitus with hyperglycemia, without long-term current use of insulin [facility])   [place] [facility] [place], Shea Stakes, NP   1 year ago Encounter for annual physical exam   [facility] [place], [place] W, NP   1 year ago Type 2 diabetes mellitus with complication, without long-term current use of insulin [facility])   [place] [facility] [place], Shea Stakes, NP       Future Appointments             In 1 week Claiborne Rigg, NP [facility]ness

## 2021-02-01 ENCOUNTER — Telehealth: Payer: Self-pay | Admitting: Internal Medicine

## 2021-02-01 DIAGNOSIS — J9611 Chronic respiratory failure with hypoxia: Secondary | ICD-10-CM | POA: Diagnosis not present

## 2021-02-01 DIAGNOSIS — J129 Viral pneumonia, unspecified: Secondary | ICD-10-CM | POA: Diagnosis not present

## 2021-02-01 DIAGNOSIS — J449 Chronic obstructive pulmonary disease, unspecified: Secondary | ICD-10-CM | POA: Diagnosis not present

## 2021-02-01 DIAGNOSIS — J9610 Chronic respiratory failure, unspecified whether with hypoxia or hypercapnia: Secondary | ICD-10-CM | POA: Diagnosis not present

## 2021-02-01 NOTE — Telephone Encounter (Signed)
Called and spoke with the pt. He states wants to try a different inhaler. Bevespi does not work as well as the Clinical cytogeneticist and he does not qualify for the pt assistance. He states that he feels like Bevespi only works for about 15 min and then he starts feeling SOB again. He states that if we sent in something new he does not want an ellipta device, bc he is not able to inhale deeply enough. He does have a neb machine. Do you think he could use neb version of Breztri? To Direct Rx? Or any other suggestions? thanks

## 2021-02-01 NOTE — Telephone Encounter (Signed)
ATC Patient.  No answer and VM was full at this time.

## 2021-02-04 MED ORDER — BREZTRI AEROSPHERE 160-9-4.8 MCG/ACT IN AERO: 2.0000 | INHALATION_SPRAY | Freq: Two times a day (BID) | RESPIRATORY_TRACT | 0 refills | Status: DC

## 2021-02-04 NOTE — Telephone Encounter (Signed)
Called and spoke with patient. He stated that he will call his insurance company and ask for a formulary. He is aware that I will leave samples of Breztri at the front desk for him.   He will call us back once he has the formulary to schedule an OV.   Nothing further needed at time of call.

## 2021-02-04 NOTE — Telephone Encounter (Signed)
Needs samples of breztri and ov with med formulary in hand before breztri runs out, ok to see NPs if I don't have availability

## 2021-02-07 ENCOUNTER — Other Ambulatory Visit: Payer: Self-pay | Admitting: Nurse Practitioner

## 2021-02-07 NOTE — Telephone Encounter (Signed)
  Notes to clinic: medication requested has been d/c   Requested Prescriptions  Pending Prescriptions Disp Refills   diltiazem (CARDIZEM CD) 240 MG 24 hr capsule [Pharmacy Med Name: DILTIAZEM 24H ER(CD) 240 MG CP] 180 capsule 1    Sig: Take 2 capsules (480 mg total) by mouth daily.      Cardiovascular:  Calcium Channel Blockers Failed - 02/07/2021  2:18 AM      Failed - Valid encounter within last 6 months    Recent Outpatient Visits           8 months ago Type 2 diabetes mellitus with hyperglycemia, without long-term current use of insulin [facility])   [place] [facility] [place], [place] W, NP   9 months ago Type 2 diabetes mellitus with hyperglycemia, without long-term current use of insulin (HCC)   [facility] [place], [place], MD   1 year ago Type 2 diabetes mellitus with hyperglycemia, without long-term current use of insulin [facility])   [place] [facility] [place], Shea Stakes, NP   1 year ago Encounter for annual physical exam   [facility] [place], [place] W, NP   1 year ago Type 2 diabetes mellitus with complication, without long-term current use of insulin [facility])   [facility] [place], Shea Stakes, NP       Future Appointments             Tomorrow Claiborne Rigg, NP [facility] And Wellness             Passed - Last BP in normal range    BP Readings from Last 1 Encounters:  01/28/21 135/83

## 2021-02-08 ENCOUNTER — Ambulatory Visit: Payer: Medicare HMO | Admitting: Nurse Practitioner

## 2021-02-11 IMAGING — CR DG CHEST 1V PORT
1 series · 1 of 1 positions shown · non-contrast
Comparison: March 18, 2020

CLINICAL DATA: Chest pain and shortness of breath

EXAM:
PORTABLE CHEST 1 VIEW

[AP]
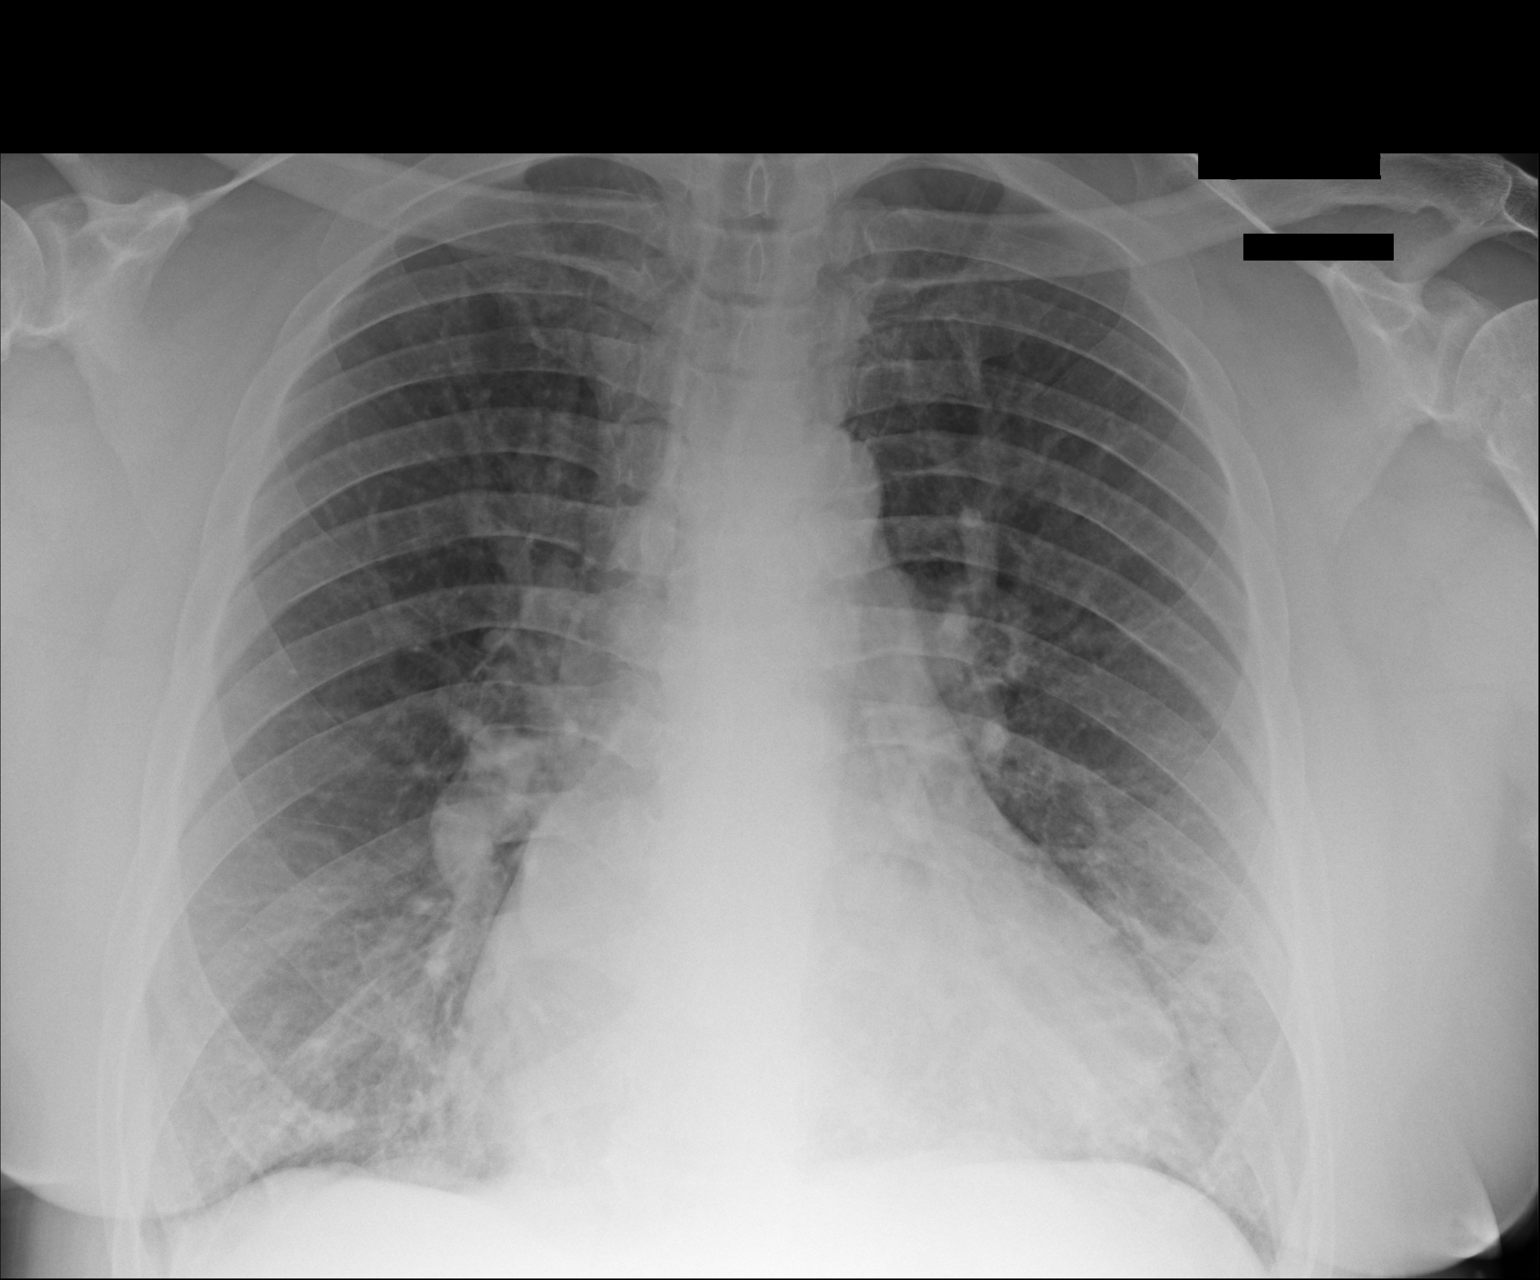

[1 of 1 positions shown; findings below may reference images not displayed]

FINDINGS: There is scarring versus atelectatic change in the lung bases,
stable from prior study. No new opacity evident. There is
cardiomegaly with a degree of pulmonary venous hypertension. No
adenopathy. No bone lesions.
IMPRESSION: Cardiomegaly with pulmonary vascular congestion, stable. Scarring
versus atelectasis in the lower lung regions, stable. No new opacity
evident.

## 2021-02-13 DIAGNOSIS — J129 Viral pneumonia, unspecified: Secondary | ICD-10-CM | POA: Diagnosis not present

## 2021-02-13 DIAGNOSIS — J449 Chronic obstructive pulmonary disease, unspecified: Secondary | ICD-10-CM | POA: Diagnosis not present

## 2021-02-18 ENCOUNTER — Other Ambulatory Visit: Payer: Self-pay | Admitting: Nurse Practitioner

## 2021-02-18 DIAGNOSIS — E1165 Type 2 diabetes mellitus with hyperglycemia: Secondary | ICD-10-CM

## 2021-02-19 ENCOUNTER — Encounter: Payer: Self-pay | Admitting: *Deleted

## 2021-02-19 ENCOUNTER — Other Ambulatory Visit: Payer: Self-pay | Admitting: *Deleted

## 2021-02-19 MED ORDER — VICTOZA 18 MG/3ML ~~LOC~~ SOPN: 1.8000 mg | PEN_INJECTOR | Freq: Every day | SUBCUTANEOUS | 0 refills | Status: DC
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
## 2021-02-22 ENCOUNTER — Other Ambulatory Visit: Payer: Self-pay | Admitting: *Deleted
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
## 2021-03-03 DIAGNOSIS — J9610 Chronic respiratory failure, unspecified whether with hypoxia or hypercapnia: Secondary | ICD-10-CM | POA: Diagnosis not present

## 2021-03-03 DIAGNOSIS — J129 Viral pneumonia, unspecified: Secondary | ICD-10-CM | POA: Diagnosis not present

## 2021-03-03 DIAGNOSIS — J9611 Chronic respiratory failure with hypoxia: Secondary | ICD-10-CM | POA: Diagnosis not present

## 2021-03-03 DIAGNOSIS — J449 Chronic obstructive pulmonary disease, unspecified: Secondary | ICD-10-CM | POA: Diagnosis not present

## 2021-03-04 ENCOUNTER — Other Ambulatory Visit: Payer: Self-pay | Admitting: Nurse Practitioner

## 2021-03-04 DIAGNOSIS — Z8679 Personal history of other diseases of the circulatory system: Secondary | ICD-10-CM

## 2021-03-04 NOTE — Telephone Encounter (Signed)
Copied from CRM [id number]. Topic: Quick Communication - Rx Refill/Question >> Mar 04, 2021 10:41 AM Marylen Ponto wrote: Medication: acetaminophen-codeine (TYLENOL #3) 300-30 MG tablet and apixaban (ELIQUIS) 5 MG TABS tablet   Has the patient contacted their pharmacy? No. (Agent: If no, request that the patient contact the pharmacy for the refill.) (Agent: If yes, when and what did the pharmacy advise?)  Preferred Pharmacy (with phone number or street name): [id number] [place], [place] - [address]  Phone: [phone]   Fax: [phone]  Agent: Please be advised that RX refills may take up to 3 business days. We ask that you follow-up with your pharmacy.

## 2021-03-04 NOTE — Telephone Encounter (Signed)
Requested medication (s) are due for refill today: yes  Future visit scheduled: yes  Notes to clinic:  this refill cannot be delegated   Requested Prescriptions  Pending Prescriptions Disp Refills   acetaminophen-codeine (TYLENOL #3) 300-30 MG tablet 60 tablet 0    Sig: Take 1 tablet by mouth every 12 (twelve) hours as needed for moderate pain.      Not Delegated - Analgesics:  Opioid Agonist Combinations Failed - 03/04/2021 10:47 AM      Failed - This refill cannot be delegated      Failed - Urine Drug Screen completed in last 360 days      Failed - Valid encounter within last 6 months    Recent Outpatient Visits           9 months ago Type 2 diabetes mellitus with hyperglycemia, without long-term current use of insulin [facility])   [place] [facility] [employer], [place] W, NP   10 months ago Type 2 diabetes mellitus with hyperglycemia, without long-term current use of insulin (HCC)   [place] [facility] [place], [place], MD   1 year ago Type 2 diabetes mellitus with hyperglycemia, without long-term current use of insulin [facility])   [place] [facility] [place], Shea Stakes, NP   1 year ago Encounter for annual physical exam   [facility] [place], [place] W, NP   1 year ago Type 2 diabetes mellitus with complication, without long-term current use of insulin [facility])   [place] [facility] [place], Shea Stakes, NP       Future Appointments             In 1 month Claiborne Rigg, NP [facility]               apixaban (ELIQUIS) 5 MG TABS tablet 180 tablet 3    Sig: Take 1 tablet (5 mg total) by mouth 2 (two) times daily.      Hematology:  Anticoagulants Passed - 03/04/2021 10:47 AM      Passed - HGB in normal range and within 360 days    Hemoglobin  Date Value Ref Range Status  11/01/2020 13.9 13.0 - 17.0 g/dL Final  60/05/9322 55.7 13.0 - 17.7 g/dL  Final          Passed - PLT in normal range and within 360 days    Platelets  Date Value Ref Range Status  11/01/2020 265 150 - 400 K/uL Final  09/22/2019 281 150 - 450 x10E3/uL Final          Passed - HCT in normal range and within 360 days    HCT  Date Value Ref Range Status  11/01/2020 46.3 39.0 - 52.0 % Final   Hematocrit  Date Value Ref Range Status  09/22/2019 44.6 37.5 - 51.0 % Final          Passed - Cr in normal range and within 360 days    Creat  Date Value Ref Range Status  01/28/2021 1.21 0.70 - 1.33 mg/dL Final    Comment:    For patients >12 years of age, the reference limit for Creatinine is approximately 13% higher for people identified as African-American. .    Creatinine, Urine  Date Value Ref Range Status  10/08/2017 122.33 mg/dL Final    Comment:    Performed at [facility], [address], [place],  [place] [address]          Passed - Valid encounter within last 12 months    Recent Outpatient Visits           9 months ago Type 2 diabetes mellitus with hyperglycemia, without long-term current use of insulin [facility])   [place] [facility] [place], [place] W, NP   10 months ago Type 2 diabetes mellitus with hyperglycemia, without long-term current use of insulin (HCC)   [place] [facility] [place], [place], MD   1 year ago Type 2 diabetes mellitus with hyperglycemia, without long-term current use of insulin [facility])   [place] [facility] [place], Shea Stakes, NP   1 year ago Encounter for annual physical exam   [facility] [place], [place] W, NP   1 year ago Type 2 diabetes mellitus with complication, without long-term current use of insulin [facility])   [place] [facility] [place], Shea Stakes, NP       Future Appointments             In 1 month Claiborne Rigg, NP [facility]

## 2021-03-05 ENCOUNTER — Other Ambulatory Visit: Payer: Self-pay

## 2021-03-05 DIAGNOSIS — Z8679 Personal history of other diseases of the circulatory system: Secondary | ICD-10-CM

## 2021-03-05 MED ORDER — APIXABAN 5 MG PO TABS: 5.0000 mg | ORAL_TABLET | Freq: Two times a day (BID) | ORAL | 1 refills | Status: AC

## 2021-03-05 NOTE — Telephone Encounter (Signed)
Prescription refill request for Eliquis received. Indication:AFIB Last office visit:LAWR[place] 12/20/19 Scr:1.39 01/28/21 Age: 27M Weight:111.6KG

## 2021-03-07 MED ORDER — ACETAMINOPHEN-CODEINE #3 300-30 MG PO TABS: 1.0000 | ORAL_TABLET | Freq: Two times a day (BID) | ORAL | 0 refills | Status: DC | PRN

## 2021-03-08 ENCOUNTER — Ambulatory Visit: Payer: Self-pay | Admitting: *Deleted

## 2021-03-16 DIAGNOSIS — J449 Chronic obstructive pulmonary disease, unspecified: Secondary | ICD-10-CM | POA: Diagnosis not present

## 2021-03-16 DIAGNOSIS — J129 Viral pneumonia, unspecified: Secondary | ICD-10-CM | POA: Diagnosis not present

## 2021-03-25 ENCOUNTER — Telehealth: Payer: Self-pay | Admitting: Internal Medicine

## 2021-03-25 MED ORDER — PREDNISONE 10 MG PO TABS: ORAL_TABLET | ORAL | 0 refills | Status: AC

## 2021-03-25 NOTE — Telephone Encounter (Signed)
Called and spoke with pt who states the Bevespi inhaler is not working well for him at all. Pt said since he had to switch to the [place] due to the [place] not being covered by insurance, he has noticed that he is more SOB now than he was when he was on the [place].  Pt said that he is becoming SOB quicker with being on the Bevespi, even if only walking short distances.  Pt said that he is having to overuse his rescue inhaler doing at least 8-10 treatments and is having to do at least 2 neb treatments a day.  Dr. Sherene Sires, please advise what you recommend for pt. Please advise if you want [place] to try to do a PA for the [facility] to see if we might be able to get it approved for him.  Pt has tried/failed Trelegy, Vito Berger, Incruse, Symbicort, Dulera, Spiriva so due to this, we do have a good chance that we can get a PA approved for the [facility].

## 2021-03-25 NOTE — Telephone Encounter (Signed)
Pt is calling in regards to his inhaler (Bevespi) not working to better his breathing, pt stated that he is more SOB and states that he has been feeling "terrible" and would like something to help him.  [facility]; [id number] [place], [place] - [address] AT [facility] [address]  [address], [place] [address]   Pls regard; [phone]

## 2021-03-25 NOTE — Telephone Encounter (Signed)
Called and spoke with pt letting him know recs per MW and he verbalized understanding. Rx for prednisone has been sent to preferred pharmacy for pt and also have scheduled pt a f/u with MW and told him to bring all meds to pharmacy when  he comes. Nothing further needed.

## 2021-03-25 NOTE — Telephone Encounter (Signed)
Bevespi is the same as breztri except it lacks the prednisone compoent so rec:  Prednisone 10 mg take  4 each am x 2 days,   2 each am x 2 days,  1 each am x 2 days and stop  Continue bevespi  Ov in 2 weeks to regroup - bring all meds, ok to use one of my blocked spots

## 2021-03-26 ENCOUNTER — Other Ambulatory Visit: Payer: Medicare HMO | Admitting: *Deleted
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
## 2021-03-27 ENCOUNTER — Other Ambulatory Visit: Payer: Self-pay | Admitting: Nurse Practitioner

## 2021-03-27 MED ORDER — BLOOD GLUCOSE MONITOR KIT: PACK | 0 refills | Status: AC

## 2021-04-01 ENCOUNTER — Other Ambulatory Visit: Payer: Self-pay | Admitting: Nurse Practitioner

## 2021-04-03 ENCOUNTER — Other Ambulatory Visit: Payer: Self-pay | Admitting: Nurse Practitioner

## 2021-04-03 DIAGNOSIS — J449 Chronic obstructive pulmonary disease, unspecified: Secondary | ICD-10-CM | POA: Diagnosis not present

## 2021-04-03 DIAGNOSIS — J129 Viral pneumonia, unspecified: Secondary | ICD-10-CM | POA: Diagnosis not present

## 2021-04-03 DIAGNOSIS — J9610 Chronic respiratory failure, unspecified whether with hypoxia or hypercapnia: Secondary | ICD-10-CM | POA: Diagnosis not present

## 2021-04-03 DIAGNOSIS — J9611 Chronic respiratory failure with hypoxia: Secondary | ICD-10-CM | POA: Diagnosis not present

## 2021-04-03 NOTE — Telephone Encounter (Signed)
Requested medication (s) are due for refill today: yes  Requested medication (s) are on the active medication list: yes  Last refill:  03/04/2021  Future visit scheduled: yes  Notes to clinic:  this refill cannot be delegated Due for office visit and UA    Requested Prescriptions  Pending Prescriptions Disp Refills   acetaminophen-codeine (TYLENOL #3) 300-30 MG tablet 60 tablet 0    Sig: Take 1 tablet by mouth every 12 (twelve) hours as needed for moderate pain.     Not Delegated - Analgesics:  Opioid Agonist Combinations Failed - 04/03/2021  1:59 PM      Failed - This refill cannot be delegated      Failed - Urine Drug Screen completed in last 360 days      Failed - Valid encounter within last 6 months    Recent Outpatient Visits           10 months ago Type 2 diabetes mellitus with hyperglycemia, without long-term current use of insulin [facility])   [place] [facility] [place], [place] W, NP   11 months ago Type 2 diabetes mellitus with hyperglycemia, without long-term current use of insulin (HCC)   [facility] [place], [place], MD   1 year ago Type 2 diabetes mellitus with hyperglycemia, without long-term current use of insulin [facility])   [place] [facility] [place], Shea Stakes, NP   1 year ago Encounter for annual physical exam   [facility] [place], [place], NP   1 year ago Type 2 diabetes mellitus with complication, without long-term current use of insulin [facility])   [place] [facility] [place], Shea Stakes, NP       Future Appointments             In 2 days Claiborne Rigg, NP [employer] [facility]   In 1 week Sherene Sires, Charlaine Dalton, MD [facility]

## 2021-04-03 NOTE — Telephone Encounter (Signed)
Medication Refill - Medication: Tylenol 3  Has the patient contacted their pharmacy? No. Pt states that he never contacts pharmacy. Please advise.  (Agent: If no, request that the patient contact the pharmacy for the refill.) (Agent: If yes, when and what did the pharmacy advise?)  Preferred Pharmacy (with phone number or street name):  [id number] - [place], [place] - [address] AT [facility] [address]  [address] [place] [address]  Phone: [phone] Fax: [phone]  Hours: Open 24 hours   Agent: Please be advised that RX refills may take up to 3 business days. We ask that you follow-up with your pharmacy.

## 2021-04-03 NOTE — Telephone Encounter (Signed)
Pt called back and wanted to know if this would be refilled due to having an appt on 04/05/21, please advise.

## 2021-04-05 ENCOUNTER — Ambulatory Visit: Payer: Medicare HMO | Attending: Nurse Practitioner | Admitting: Nurse Practitioner

## 2021-04-05 ENCOUNTER — Other Ambulatory Visit: Payer: Self-pay

## 2021-04-05 VITALS — BP 113/70 | HR 96 | Ht 69.0 in | Wt 245.4 lb

## 2021-04-05 DIAGNOSIS — E1165 Type 2 diabetes mellitus with hyperglycemia: Secondary | ICD-10-CM | POA: Diagnosis not present

## 2021-04-05 DIAGNOSIS — J449 Chronic obstructive pulmonary disease, unspecified: Secondary | ICD-10-CM

## 2021-04-05 DIAGNOSIS — G8929 Other chronic pain: Secondary | ICD-10-CM

## 2021-04-05 DIAGNOSIS — E1140 Type 2 diabetes mellitus with diabetic neuropathy, unspecified: Secondary | ICD-10-CM

## 2021-04-05 DIAGNOSIS — I480 Paroxysmal atrial fibrillation: Secondary | ICD-10-CM

## 2021-04-05 DIAGNOSIS — M5441 Lumbago with sciatica, right side: Secondary | ICD-10-CM | POA: Diagnosis not present

## 2021-04-05 DIAGNOSIS — N521 Erectile dysfunction due to diseases classified elsewhere: Secondary | ICD-10-CM | POA: Diagnosis not present

## 2021-04-05 DIAGNOSIS — E785 Hyperlipidemia, unspecified: Secondary | ICD-10-CM

## 2021-04-05 LAB — POCT GLYCOSYLATED HEMOGLOBIN (HGB A1C): HbA1c, POC (controlled diabetic range): 7.8 % — AB (ref 0.0–7.0)

## 2021-04-05 LAB — GLUCOSE, POCT (MANUAL RESULT ENTRY): POC Glucose: 165 mg/dl — AB (ref 70–99)

## 2021-04-05 MED ORDER — ALBUTEROL SULFATE HFA 108 (90 BASE) MCG/ACT IN AERS: INHALATION_SPRAY | RESPIRATORY_TRACT | 1 refills | Status: AC

## 2021-04-05 MED ORDER — ACETAMINOPHEN-CODEINE #3 300-30 MG PO TABS: 1.0000 | ORAL_TABLET | Freq: Two times a day (BID) | ORAL | 0 refills | Status: DC | PRN

## 2021-04-05 MED ORDER — METFORMIN HCL 1000 MG PO TABS: 1000.0000 mg | ORAL_TABLET | Freq: Two times a day (BID) | ORAL | 1 refills | Status: AC

## 2021-04-05 MED ORDER — DILTIAZEM HCL ER BEADS 240 MG PO CP24: 240.0000 mg | ORAL_CAPSULE | Freq: Every day | ORAL | 0 refills | Status: AC

## 2021-04-05 MED ORDER — AMITRIPTYLINE HCL 25 MG PO TABS: 25.0000 mg | ORAL_TABLET | Freq: Every day | ORAL | 0 refills | Status: DC
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
## 2021-04-06 ENCOUNTER — Encounter: Payer: Self-pay | Admitting: Nurse Practitioner

## 2021-04-06 MED ORDER — ROSUVASTATIN CALCIUM 20 MG PO TABS
20.0000 mg | ORAL_TABLET | Freq: Every day | ORAL | 3 refills | Status: AC
Start: 2021-04-06 — End: ?

## 2021-04-09 ENCOUNTER — Other Ambulatory Visit: Payer: Self-pay | Admitting: *Deleted

## 2021-04-14 ENCOUNTER — Telehealth: Payer: Self-pay

## 2021-04-14 NOTE — Telephone Encounter (Signed)
Left detailed message for patient to ret call to schedule AWV.

## 2021-04-16 ENCOUNTER — Ambulatory Visit: Payer: Medicare HMO | Admitting: Internal Medicine

## 2021-04-16 DIAGNOSIS — J449 Chronic obstructive pulmonary disease, unspecified: Secondary | ICD-10-CM | POA: Diagnosis not present

## 2021-04-16 DIAGNOSIS — J129 Viral pneumonia, unspecified: Secondary | ICD-10-CM | POA: Diagnosis not present

## 2021-04-18 ENCOUNTER — Ambulatory Visit: Payer: Medicare HMO | Admitting: Internal Medicine

## 2021-04-18 ENCOUNTER — Telehealth: Payer: Self-pay | Admitting: Internal Medicine

## 2021-04-18 ENCOUNTER — Encounter: Payer: Self-pay | Admitting: Adult Health

## 2021-04-18 ENCOUNTER — Ambulatory Visit (INDEPENDENT_AMBULATORY_CARE_PROVIDER_SITE_OTHER): Payer: Medicare HMO | Admitting: Adult Health

## 2021-04-18 ENCOUNTER — Other Ambulatory Visit: Payer: Self-pay

## 2021-04-18 ENCOUNTER — Ambulatory Visit (INDEPENDENT_AMBULATORY_CARE_PROVIDER_SITE_OTHER): Payer: Medicare HMO

## 2021-04-18 VITALS — BP 148/70 | HR 99 | Temp 98.1°F | Ht 69.0 in | Wt 248.4 lb

## 2021-04-18 DIAGNOSIS — Z23 Encounter for immunization: Secondary | ICD-10-CM | POA: Diagnosis not present

## 2021-04-18 DIAGNOSIS — J449 Chronic obstructive pulmonary disease, unspecified: Secondary | ICD-10-CM | POA: Diagnosis not present

## 2021-04-18 DIAGNOSIS — J9611 Chronic respiratory failure with hypoxia: Secondary | ICD-10-CM | POA: Diagnosis not present

## 2021-04-18 DIAGNOSIS — J9612 Chronic respiratory failure with hypercapnia: Secondary | ICD-10-CM

## 2021-04-18 DIAGNOSIS — G4733 Obstructive sleep apnea (adult) (pediatric): Secondary | ICD-10-CM

## 2021-04-18 DIAGNOSIS — J439 Emphysema, unspecified: Secondary | ICD-10-CM | POA: Diagnosis not present

## 2021-04-18 MED ORDER — BREZTRI AEROSPHERE 160-9-4.8 MCG/ACT IN AERO: 2.0000 | INHALATION_SPRAY | Freq: Two times a day (BID) | RESPIRATORY_TRACT | 0 refills | Status: AC

## 2021-04-18 NOTE — Telephone Encounter (Signed)
disregard
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
## 2021-04-18 NOTE — Assessment & Plan Note (Addendum)
Severe COPD recent exacerbation now resolving.  Check chest x-ray today.  Patient needs to have triple therapy maintenance inhaler.  Prior authorization sent to insurance company for approval Discussion with patient regarding steroid use with antivirals.  Patient is aware risk and benefit discussed   Plan  Patient Instructions  Chest xray today .  Change BEVESPI to BREZTRI 2 puffs Twice daily  , rinse after use. Will try to get approval through insurance.  Mucinex DM Twice daily  As needed  cough/congestion  Continue on Oxygen 3l/m , goal is to have O2 sats >88-90%.  Work on not smoking .  Follow up with Dr. Sherene Sires  in 6 weeks and As needed   Please contact office for sooner follow up if symptoms do not improve or worsen or seek emergency care

## 2021-04-18 NOTE — Patient Instructions (Addendum)
Chest xray today .  Change BEVESPI to BREZTRI 2 puffs Twice daily  , rinse after use. Will try to get approval through insurance.  Mucinex DM Twice daily  As needed  cough/congestion  Continue on Oxygen 3l/m , goal is to have O2 sats >88-90%.  Work on not smoking .  Follow up with Dr. Sherene Sires  in 6 weeks and As needed   Please contact office for sooner follow up if symptoms do not improve or worsen or seek emergency care

## 2021-04-18 NOTE — Assessment & Plan Note (Signed)
Continue on oxygen to maintain O2 saturations greater than 88 to 90%.  Plan  Patient Instructions  Chest xray today .  Change BEVESPI to BREZTRI 2 puffs Twice daily  , rinse after use. Will try to get approval through insurance.  Mucinex DM Twice daily  As needed  cough/congestion  Continue on Oxygen 3l/m , goal is to have O2 sats >88-90%.  Work on not smoking .  Follow up with Dr. Sherene Sires  in 6 weeks and As needed   Please contact office for sooner follow up if symptoms do not improve or worsen or seek emergency care

## 2021-04-19 ENCOUNTER — Other Ambulatory Visit: Payer: Self-pay | Admitting: *Deleted

## 2021-04-19 MED ORDER — AMOXICILLIN-POT CLAVULANATE 875-125 MG PO TABS: 1.0000 | ORAL_TABLET | Freq: Two times a day (BID) | ORAL | 0 refills | Status: DC

## 2021-04-19 NOTE — Progress Notes (Signed)
Called and spoke with patient, provided results/recommendations per Rubye Oaks NP.  Verified pharmacy and script sent.  Nothing further needed.

## 2021-05-04 DIAGNOSIS — J129 Viral pneumonia, unspecified: Secondary | ICD-10-CM | POA: Diagnosis not present

## 2021-05-04 DIAGNOSIS — J9610 Chronic respiratory failure, unspecified whether with hypoxia or hypercapnia: Secondary | ICD-10-CM | POA: Diagnosis not present

## 2021-05-04 DIAGNOSIS — J9611 Chronic respiratory failure with hypoxia: Secondary | ICD-10-CM | POA: Diagnosis not present

## 2021-05-04 DIAGNOSIS — J449 Chronic obstructive pulmonary disease, unspecified: Secondary | ICD-10-CM | POA: Diagnosis not present

## 2021-05-14 ENCOUNTER — Other Ambulatory Visit: Payer: Self-pay | Admitting: Nurse Practitioner

## 2021-05-14 ENCOUNTER — Telehealth: Payer: Self-pay | Admitting: Nurse Practitioner

## 2021-05-14 DIAGNOSIS — G8929 Other chronic pain: Secondary | ICD-10-CM

## 2021-05-14 DIAGNOSIS — M5441 Lumbago with sciatica, right side: Secondary | ICD-10-CM

## 2021-05-14 MED ORDER — ACETAMINOPHEN-CODEINE #3 300-30 MG PO TABS
1.0000 | ORAL_TABLET | Freq: Two times a day (BID) | ORAL | 0 refills | Status: AC | PRN
Start: 2021-05-14 — End: ?

## 2021-05-14 NOTE — Telephone Encounter (Signed)
Medication Refill - Medication: acetaminophen-codeine (TYLENOL #3) 300-30 MG tablet  Has the patient contacted their pharmacy? Yes.   (Agent: If no, request that the patient contact the pharmacy for the refill.) (Agent: If yes, when and what did the pharmacy advise?)  Preferred Pharmacy (with phone number or street name):  [id number] - [place], [place] - [address] AT [facility] [address]  [address] [place] [address]  Phone: [phone] Fax: [phone]   Has the patient been seen for an appointment in the last year OR does the patient have an upcoming appointment? Yes.    Agent: Please be advised that RX refills may take up to 3 business days. We ask that you follow-up with your pharmacy.

## 2021-05-16 DIAGNOSIS — J449 Chronic obstructive pulmonary disease, unspecified: Secondary | ICD-10-CM | POA: Diagnosis not present

## 2021-05-16 DIAGNOSIS — J129 Viral pneumonia, unspecified: Secondary | ICD-10-CM | POA: Diagnosis not present

## 2021-05-17 ENCOUNTER — Other Ambulatory Visit: Payer: Self-pay

## 2021-05-17 ENCOUNTER — Ambulatory Visit (INDEPENDENT_AMBULATORY_CARE_PROVIDER_SITE_OTHER): Payer: Medicare HMO

## 2021-05-17 ENCOUNTER — Ambulatory Visit (INDEPENDENT_AMBULATORY_CARE_PROVIDER_SITE_OTHER): Payer: Medicare HMO | Admitting: Adult Health

## 2021-05-17 ENCOUNTER — Telehealth: Payer: Self-pay | Admitting: *Deleted

## 2021-05-17 ENCOUNTER — Encounter: Payer: Self-pay | Admitting: Adult Health

## 2021-05-17 DIAGNOSIS — J441 Chronic obstructive pulmonary disease with (acute) exacerbation: Secondary | ICD-10-CM | POA: Diagnosis not present

## 2021-05-17 DIAGNOSIS — J9611 Chronic respiratory failure with hypoxia: Secondary | ICD-10-CM

## 2021-05-17 DIAGNOSIS — R69 Illness, unspecified: Secondary | ICD-10-CM | POA: Diagnosis not present

## 2021-05-17 DIAGNOSIS — J9612 Chronic respiratory failure with hypercapnia: Secondary | ICD-10-CM

## 2021-05-17 DIAGNOSIS — F17210 Nicotine dependence, cigarettes, uncomplicated: Secondary | ICD-10-CM | POA: Diagnosis not present

## 2021-05-17 DIAGNOSIS — J439 Emphysema, unspecified: Secondary | ICD-10-CM | POA: Diagnosis not present

## 2021-05-17 MED ORDER — BREZTRI AEROSPHERE 160-9-4.8 MCG/ACT IN AERO
2.0000 | INHALATION_SPRAY | Freq: Two times a day (BID) | RESPIRATORY_TRACT | 6 refills | Status: AC
Start: 2021-05-17 — End: ?

## 2021-05-17 NOTE — Assessment & Plan Note (Signed)
Smoking cessation  

## 2021-05-17 NOTE — Assessment & Plan Note (Addendum)
Recent flare +/_ PNA now improving - chest xray is pending , if not improved consider CT chest  Smoking cessation encouraged   Plan  Patient Instructions  Continue on BREZTRI 2 puffs Twice daily  , rinse after use. Will try to get approval through insurance.  Albuterol inhaler and Neb as needed.  Mucinex DM Twice daily  As needed  cough/congestion  Continue on Oxygen 3l/m , goal is to have O2 sats >88-90%.  Order for POC .  Work on not smoking .  Follow up with Dr. Sherene Sires  in 2-3 months and As needed   Please contact office for sooner follow up if symptoms do not improve or worsen or seek emergency care

## 2021-05-17 NOTE — Patient Instructions (Addendum)
Continue on BREZTRI 2 puffs Twice daily  , rinse after use. Will try to get approval through insurance.  Albuterol inhaler and Neb as needed.  Mucinex DM Twice daily  As needed  cough/congestion  Continue on Oxygen 3l/m , goal is to have O2 sats >88-90%.  Order for POC .  Work on not smoking .  Follow up with Dr. Sherene Sires  in 2-3 months and As needed   Please contact office for sooner follow up if symptoms do not improve or worsen or seek emergency care
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
## 2021-05-17 NOTE — Telephone Encounter (Signed)
Called [phone] to start a PA for the [facility] since I was unable to complete it online.  I spoke with [place] who informed me that there was no need for a PA for this medication.  [facility].  Advised that the patient's insurance does not require a PA for the [place], however, we were advised that it did require a PA.  I was advised that the script from March had been inactivated.  [facility], advised that it went through and there is no charge.  Called patient, no answer, left VM advised that script for Breztri did not require a PA and that it went through and it is a zero copay.  Advised to call the office with any questions.

## 2021-05-17 NOTE — Assessment & Plan Note (Signed)
Continue on O2 

## 2021-05-21 ENCOUNTER — Other Ambulatory Visit: Payer: Self-pay | Admitting: *Deleted

## 2021-05-22 ENCOUNTER — Other Ambulatory Visit: Payer: Self-pay | Admitting: Nurse Practitioner

## 2021-05-22 DIAGNOSIS — E118 Type 2 diabetes mellitus with unspecified complications: Secondary | ICD-10-CM

## 2021-05-22 NOTE — Telephone Encounter (Signed)
Requested Prescriptions  Pending Prescriptions Disp Refills  . ONETOUCH ULTRA test strip [employer] Med Name: ONE TOUCH ULTRA BLUE TEST STRP] 100 strip 10    Sig: USE UP TO TWICE A DAY AS DIRECTED     Endocrinology: Diabetes - Testing Supplies Passed - 05/22/2021 12:18 AM      Passed - Valid encounter within last 12 months    Recent Outpatient Visits          1 month ago Type 2 diabetes mellitus with hyperglycemia, without long-term current use of insulin [facility])   [place] [facility] [place], [place] W, NP   11 months ago Type 2 diabetes mellitus with hyperglycemia, without long-term current use of insulin (HCC)   [place] [facility] [place], [place] W, NP   1 year ago Type 2 diabetes mellitus with hyperglycemia, without long-term current use of insulin (HCC)   [facility] [place], [place], MD   1 year ago Type 2 diabetes mellitus with hyperglycemia, without long-term current use of insulin [facility])   [place] [facility] [place], Shea Stakes, NP   1 year ago Encounter for annual physical exam   [facility] [place], Shea Stakes, NP      Future Appointments            In 2 weeks Riley Nearing, Dot Lanes M., PA-C [facility], [facility]

## 2021-05-23 ENCOUNTER — Other Ambulatory Visit: Payer: Medicare HMO | Admitting: *Deleted

## 2021-05-23 NOTE — Patient Outreach (Signed)
[facility]) Care Management  05/23/2021  Chris Favors Kicklighter Jr. 02/06/1966 [id number]  Telephone outreach #2 unanswered with message saying the number is not working. Sent unsuccessful letter. Will call again in 2 weeks.  Chris [name]. Burgess Estelle, MSN, [facility] Gerontological Nurse Practitioner [facility] Care Management [phone]

## 2021-05-23 NOTE — Patient Outreach (Signed)
[facility]) Care Management  05/23/2021  [name] Prows Jr. 1966/05/17 [id number]  Telephone outreach unsuccessful. Left a message requested a return call.  Zara Council. Burgess Estelle, MSN, [facility] Gerontological Nurse Practitioner [facility] Care Management [phone]

## 2021-05-27 ENCOUNTER — Other Ambulatory Visit: Payer: Medicare HMO

## 2021-05-27 ENCOUNTER — Other Ambulatory Visit: Payer: Self-pay | Admitting: Nurse Practitioner

## 2021-05-27 DIAGNOSIS — F4321 Adjustment disorder with depressed mood: Secondary | ICD-10-CM

## 2021-05-28 NOTE — Telephone Encounter (Signed)
Seen 04/05/21, last OV that addressed med/dx 06/04/20, pt canceled 05/27/21. Please assess.  Requested Prescriptions  Pending Prescriptions Disp Refills  . DULoxetine (CYMBALTA) 60 MG capsule [Pharmacy Med Name: DULOXETINE HCL DR 60 MG CAP] 90 capsule 0    Sig: TAKE 1 CAPSULE BY MOUTH EVERY DAY     Psychiatry: Antidepressants - SNRI Failed - 05/27/2021 11:24 PM      Failed - Last BP in normal range    BP Readings from Last 1 Encounters:  05/17/21 140/60         Passed - Completed PHQ-2 or PHQ-9 in the last 360 days      Passed - Valid encounter within last 6 months    Recent Outpatient Visits          1 month ago Type 2 diabetes mellitus with hyperglycemia, without long-term current use of insulin [facility])   [facility] [place], [place] W, NP   11 months ago Type 2 diabetes mellitus with hyperglycemia, without long-term current use of insulin [facility])   [facility] [place], [place] W, NP   1 year ago Type 2 diabetes mellitus with hyperglycemia, without long-term current use of insulin (HCC)   [facility] [place], [place], MD   1 year ago Type 2 diabetes mellitus with hyperglycemia, without long-term current use of insulin [facility])   [place] [facility] Wellness [place], Shea Stakes, NP   1 year ago Encounter for annual physical exam   [facility] [place], Shea Stakes, NP      Future Appointments            In 1 week Riley Nearing, Dot Lanes M., PA-C [facility], [facility]

## 2021-06-03 DIAGNOSIS — J9611 Chronic respiratory failure with hypoxia: Secondary | ICD-10-CM | POA: Diagnosis not present

## 2021-06-03 DIAGNOSIS — J129 Viral pneumonia, unspecified: Secondary | ICD-10-CM | POA: Diagnosis not present

## 2021-06-03 DIAGNOSIS — J449 Chronic obstructive pulmonary disease, unspecified: Secondary | ICD-10-CM | POA: Diagnosis not present

## 2021-06-03 DIAGNOSIS — J9610 Chronic respiratory failure, unspecified whether with hypoxia or hypercapnia: Secondary | ICD-10-CM | POA: Diagnosis not present

## 2021-06-05 ENCOUNTER — Ambulatory Visit: Payer: Medicare HMO | Admitting: Podiatry

## 2021-06-05 ENCOUNTER — Ambulatory Visit: Payer: Medicare HMO | Admitting: Medical
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
## 2021-06-07 ENCOUNTER — Other Ambulatory Visit: Payer: Self-pay | Admitting: *Deleted

## 2021-06-07 ENCOUNTER — Other Ambulatory Visit: Payer: Self-pay | Admitting: Nurse Practitioner

## 2021-06-07 DIAGNOSIS — M5441 Lumbago with sciatica, right side: Secondary | ICD-10-CM

## 2021-06-07 DIAGNOSIS — G8929 Other chronic pain: Secondary | ICD-10-CM

## 2021-06-07 NOTE — Telephone Encounter (Signed)
Medication Refill - Medication: acetaminophen-codeine (TYLENOL #3) 300-30 MG tablet  Has the patient contacted their pharmacy? Yes.   (Agent: If no, request that the patient contact the pharmacy for the refill. If patient does not wish to contact the pharmacy document the reason why and proceed with request.) (Agent: If yes, when and what did the pharmacy advise?)  Preferred Pharmacy (with phone number or street name):  [id number] [place] [place] - [address] [facility] [address]  [address] [place] [address]  Phone: [phone] Fax: [phone]   Has the patient been seen for an appointment in the last year OR does the patient have an upcoming appointment? Yes.    Agent: Please be advised that RX refills may take up to 3 business days. We ask that you follow-up with your pharmacy.

## 2021-06-07 NOTE — Patient Outreach (Addendum)
[facility]) Care Management  06/07/2021  Chris Favors Taborn Jr. 01-22-1966 [id number]  Telephone outreach to follow up on DM and COPD.   Goals Addressed             This Visit's Progress    I want to start doing some exercise in the next 30 days. Pt to visit [employer] programs with the next 30 days.       Start date 06/07/21 Medium Priority Short Term goal Expected end date 07/10/21 Follow up date 07/10/21 Barriers: Health Behaviors  Notes: 06/07/21 Asked pt what else he would like to work on and he said he would like to get back to doing some workouts. Discussed [employer]. [facility] has a [facility] close [facility] [place] that he can visit. He agrees to go within the next 30 days to check it out and see what programs he'd like to participate in. Praised for thinking how exercise would really help his overall well-being.       Monitor and manage my blood sugar as evidenced by pt report of glucose levels <200, 75% of the time.   On track    Timeframe:  Long-Range Goal Priority:  High Start Date:      02/19/21                       Expected End Date:                     Barriers: Other Doesn't have glucose monitor of his own and doesn't have Victoza because of no refills. 06/07/21 HAS BOTH A MONITOR AND VICTOZA.  Follow Up Date : - check blood sugar at prescribed times - take the blood sugar log to all doctor visits    Notes: 06/07/21 Chris [name] following his instructions on his glucose monitoring. He reports he has an occasional reading >200 but he is keeping at least 75% <200. We discussed if he achieves this goal we need to raise the bar so he can be working towards normal controlled levels. He says his downfall is PEPSI. He can drink diet but he doesn't enjoy it as much. Advised he must make a conscientious decision to improve his control to minimize complications or know that he can have significant problems with eyesight, kidney function and potential loss of limb. Discussed  options but encouraged good fluid intake. 04/09/21 Pt saw primary care provider on 04/05/21 HgbA1C up from 7.2 to 7.8. Pt was not taking metformin, this has been resumed. Pt has his own monitor now also. Home glucose levels last 3 days: 03/26/21 Still does not have a glucose monitor. Has checked his glucose with his wife's monitor yesterday pm was 211. Has Victoza now. NP to request primary to order a glucose monitor for him (second request). 02/19/21 not monitoring regularly only uses wife's meter when she visits. Glucose levels currently are 200-300. Does not have his Victoza which was controlling his glucose fairly well. NP to assist with getting monitor and medication. Pt to start checking his glucose daily when he gets his monitor.     COMPLETED: Track and Manage My Symptoms-COPD by pt report of following action plan and reporting any days he is in the YELLOW ZONE to NP or MD for medical advice.       Timeframe:  Long-Range Goal Priority:  High Start Date:    02/19/21  Expected End Date:   06/09/21                   Follow Up Date  06/09/21 Barriers: Other: Getting his medication Chris [name])  Notes: 06/07/21 Following Action Plan, denies any Yellow Zone days, although he does report using his rescue inhaler about 3 times a week. He has the [place], it works well for him. He says the cooler weather helps him and he avoids environment triggers. 03/26/21 PT has his COPD Action Plan and is following the recommendations. He does note that on very hot days he slips into the yellow zone but if he gets back into the [facility] he gets relief. He also has been out of his control med and is to get a new inhaler today Chris [name]). He is hoping to be able to start getting Chris [name] as it works best for him. He will see his pulmonologist next month. Encouraged following the action plan and call early if notes staying in yellow zone for instructions.  02/19/21 - Not familiar with COPD Action Plan in these  terms. NP provided explanation and instructions to call NP or MD if goes into the YELLOW ZONE without relief of his prn medication. NP to assist pt with getting meds.       Assessment: DM management improving   COPD stable                          Lack of exercise  Plan: Developed new goal for exercise. Agreed on care plan and to talk again in one month.  Zara Council. Burgess Estelle, MSN, GNP-BC Gerontological Nurse Practitioner [facility] Care Management [phone]   Assessment: DM improving self management and control                       COPD well managed

## 2021-06-08 NOTE — Telephone Encounter (Signed)
Requested medication (s) are due for refill today: yes  Requested medication (s) are on the active medication list: yes   Last refill: 05/14/21  #60  0 refills  Future visit scheduled yes 07/03/2021  Notes to clinic:Not delegated  Requested Prescriptions  Pending Prescriptions Disp Refills   acetaminophen-codeine (TYLENOL #3) 300-30 MG tablet 60 tablet 0    Sig: Take 1 tablet by mouth every 12 (twelve) hours as needed for moderate pain.     Not Delegated - Analgesics:  Opioid Agonist Combinations Failed - 06/07/2021  3:33 PM      Failed - This refill cannot be delegated      Failed - Urine Drug Screen completed in last 360 days      Passed - Valid encounter within last 6 months    Recent Outpatient Visits           2 months ago Type 2 diabetes mellitus with hyperglycemia, without long-term current use of insulin [facility])   [place] [facility] And Wellness [place], [place] W, NP   1 year ago Type 2 diabetes mellitus with hyperglycemia, without long-term current use of insulin [facility])   [name] [facility] Well[facility] [place], [place] W, NP   1 year ago Type 2 diabetes mellitus with hyperglycemia, without long-term current use of insulin (HCC)   [facility] [place], [place], MD   1 year ago Type 2 diabetes mellitus with hyperglycemia, without long-term current use of insulin [facility])   [place] [facility] [place], Shea Stakes, NP   1 year ago Encounter for annual physical exam   [employer] [facility] [place], Shea Stakes, NP       Future Appointments             In 5 days Sharon Seller, Marzella Schlein, PA-C [facility] [employer] And Wellness

## 2021-06-10 ENCOUNTER — Ambulatory Visit (INDEPENDENT_AMBULATORY_CARE_PROVIDER_SITE_OTHER): Payer: Medicare HMO | Admitting: Family

## 2021-06-10 ENCOUNTER — Encounter: Payer: Self-pay | Admitting: Family

## 2021-06-10 ENCOUNTER — Other Ambulatory Visit: Payer: Self-pay

## 2021-06-10 ENCOUNTER — Ambulatory Visit (INDEPENDENT_AMBULATORY_CARE_PROVIDER_SITE_OTHER): Payer: Medicare HMO

## 2021-06-10 VITALS — BP 146/85 | HR 91 | Temp 98.3°F | Ht 69.0 in | Wt 247.0 lb

## 2021-06-10 DIAGNOSIS — Z79899 Other long term (current) drug therapy: Secondary | ICD-10-CM | POA: Diagnosis not present

## 2021-06-10 DIAGNOSIS — Z0000 Encounter for general adult medical examination without abnormal findings: Secondary | ICD-10-CM

## 2021-06-10 DIAGNOSIS — B20 Human immunodeficiency virus [HIV] disease: Secondary | ICD-10-CM | POA: Diagnosis not present

## 2021-06-10 DIAGNOSIS — N521 Erectile dysfunction due to diseases classified elsewhere: Secondary | ICD-10-CM

## 2021-06-10 DIAGNOSIS — Z23 Encounter for immunization: Secondary | ICD-10-CM | POA: Diagnosis not present

## 2021-06-10 DIAGNOSIS — R69 Illness, unspecified: Secondary | ICD-10-CM | POA: Diagnosis not present

## 2021-06-10 MED ORDER — BIKTARVY 50-200-25 MG PO TABS: 1.0000 | ORAL_TABLET | Freq: Every day | ORAL | 5 refills | Status: AC
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
## 2021-06-10 NOTE — Assessment & Plan Note (Signed)
   Discussed importance of safe sexual practice and condom usage. Condoms declined.   Dental contact information provided in AVS to make appointment.   Covid booster updated.

## 2021-06-10 NOTE — Assessment & Plan Note (Signed)
Chris [name] continues to have well controlled virus with good adherence and tolerance to his ART regimen of Biktarvy. No signs/symptoms of opportunistic infection. Reviewed previous lab work and discussed plan of care. Check lab work today. Continue current dose of Biktarvy. Plan for follow up in 4 months or sooner if needed with lab work on the same day.

## 2021-06-10 NOTE — Patient Instructions (Addendum)
Nice to see you.  We will check your lab work today.  Continue to take your medication daily as prescribed.  Refills have been sent to the pharmacy.  Please call [facility]) to schedule/follow up on your dental care at [phone]  A referral has been sent to Urology. Please let us know if you do not hear back.   Plan for follow up in 4 months or sooner if needed with lab work on the same day.  Have a great day and stay safe!

## 2021-06-10 NOTE — Assessment & Plan Note (Deleted)
Chris [name] continues to have well controlled virus with good adherence and tolerance to his ART regimen of Biktarvy. No signs/symptoms of opportunistic infection. Reviewed previous lab work and discussed plan of care. Check lab work today. Continue current dose of Biktarvy. Plan for follow up in 4 months or sooner if needed with lab work on the same day.   

## 2021-06-10 NOTE — Assessment & Plan Note (Signed)
Mr. Mohl continues to have well controlled virus with good adherence and tolerance to his ART regimen of Biktarvy. No signs/symptoms of opportunistic infection. Reviewed previous lab work and discussed plan of care. Check lab work today. Continue current dose of Biktarvy. Plan for follow up in 4 months or sooner if needed with lab work on the same day.

## 2021-06-10 NOTE — Assessment & Plan Note (Signed)
Chris [name] continues to have issues with erectile dysfunction that are refractory to Viagra and Cialis. ED is multifactorial given his multiple co-morbidities and medication. Will refer to Urology for further assessment and discuss additional treatment options.

## 2021-06-10 NOTE — Progress Notes (Signed)
   [id number]  Name:  Chris [name].    MRN: [id number] DOB: [birth year]  06/10/2021  Chris [name] was observed post Covid-19 immunization for 15 minutes without incident. He was provided with Vaccine Information Sheet and instruction to access the V-Safe system.   Chris [name] was instructed to call 911 with any severe reactions post vaccine: Difficulty breathing  Swelling of face and throat  A fast heartbeat  A bad rash all over body  Dizziness and weakness   Immunizations Administered     Name Date Dose VIS Date Route   Pfizer Covid-19 Vaccine Bivalent Booster 06/10/2021 10:09 AM 0.3 mL 04/10/2021 Intramuscular   Manufacturer: [employer], [employer]   Lot: [id number]   NDC: [address]-3      Sandie Ano, RN

## 2021-06-11 LAB — T-HELPER CELL (CD4) - (RCID CLINIC ONLY)
CD4 % Helper T Cell: 16 % — ABNORMAL LOW (ref 33–65)
CD4 T Cell Abs: 362 /uL — ABNORMAL LOW (ref 400–1790)

## 2021-06-13 ENCOUNTER — Emergency Department (HOSPITAL_COMMUNITY): Payer: Medicare HMO

## 2021-06-13 ENCOUNTER — Inpatient Hospital Stay (HOSPITAL_COMMUNITY)
Admission: EM | Admit: 2021-06-13 | Discharge: 2021-07-11 | DRG: 003 | Disposition: E | Payer: Medicare HMO | Attending: Pulmonary Disease | Admitting: Pulmonary Disease

## 2021-06-13 ENCOUNTER — Inpatient Hospital Stay (HOSPITAL_COMMUNITY): Payer: Medicare HMO

## 2021-06-13 ENCOUNTER — Ambulatory Visit: Payer: Medicare HMO | Admitting: Physician Assistant

## 2021-06-13 ENCOUNTER — Other Ambulatory Visit: Payer: Self-pay

## 2021-06-13 ENCOUNTER — Encounter (HOSPITAL_COMMUNITY): Payer: Self-pay | Admitting: Emergency Medicine

## 2021-06-13 DIAGNOSIS — J9690 Respiratory failure, unspecified, unspecified whether with hypoxia or hypercapnia: Secondary | ICD-10-CM | POA: Diagnosis not present

## 2021-06-13 DIAGNOSIS — R062 Wheezing: Secondary | ICD-10-CM | POA: Diagnosis not present

## 2021-06-13 DIAGNOSIS — I5033 Acute on chronic diastolic (congestive) heart failure: Secondary | ICD-10-CM | POA: Diagnosis present

## 2021-06-13 DIAGNOSIS — G928 Other toxic encephalopathy: Secondary | ICD-10-CM | POA: Diagnosis not present

## 2021-06-13 DIAGNOSIS — Z7984 Long term (current) use of oral hypoglycemic drugs: Secondary | ICD-10-CM

## 2021-06-13 DIAGNOSIS — Z9281 Personal history of extracorporeal membrane oxygenation (ECMO): Secondary | ICD-10-CM

## 2021-06-13 DIAGNOSIS — E1165 Type 2 diabetes mellitus with hyperglycemia: Secondary | ICD-10-CM | POA: Diagnosis not present

## 2021-06-13 DIAGNOSIS — N179 Acute kidney failure, unspecified: Secondary | ICD-10-CM | POA: Diagnosis not present

## 2021-06-13 DIAGNOSIS — L89159 Pressure ulcer of sacral region, unspecified stage: Secondary | ICD-10-CM | POA: Diagnosis not present

## 2021-06-13 DIAGNOSIS — J9601 Acute respiratory failure with hypoxia: Secondary | ICD-10-CM | POA: Diagnosis not present

## 2021-06-13 DIAGNOSIS — J9622 Acute and chronic respiratory failure with hypercapnia: Secondary | ICD-10-CM

## 2021-06-13 DIAGNOSIS — T17908A Unspecified foreign body in respiratory tract, part unspecified causing other injury, initial encounter: Secondary | ICD-10-CM

## 2021-06-13 DIAGNOSIS — J9611 Chronic respiratory failure with hypoxia: Secondary | ICD-10-CM | POA: Diagnosis not present

## 2021-06-13 DIAGNOSIS — Y95 Nosocomial condition: Secondary | ICD-10-CM | POA: Diagnosis not present

## 2021-06-13 DIAGNOSIS — Z825 Family history of asthma and other chronic lower respiratory diseases: Secondary | ICD-10-CM

## 2021-06-13 DIAGNOSIS — J449 Chronic obstructive pulmonary disease, unspecified: Secondary | ICD-10-CM | POA: Diagnosis not present

## 2021-06-13 DIAGNOSIS — Z9981 Dependence on supplemental oxygen: Secondary | ICD-10-CM

## 2021-06-13 DIAGNOSIS — E785 Hyperlipidemia, unspecified: Secondary | ICD-10-CM | POA: Diagnosis present

## 2021-06-13 DIAGNOSIS — J101 Influenza due to other identified influenza virus with other respiratory manifestations: Secondary | ICD-10-CM | POA: Diagnosis not present

## 2021-06-13 DIAGNOSIS — K922 Gastrointestinal hemorrhage, unspecified: Secondary | ICD-10-CM | POA: Diagnosis not present

## 2021-06-13 DIAGNOSIS — I4819 Other persistent atrial fibrillation: Secondary | ICD-10-CM | POA: Diagnosis present

## 2021-06-13 DIAGNOSIS — E875 Hyperkalemia: Secondary | ICD-10-CM | POA: Diagnosis present

## 2021-06-13 DIAGNOSIS — Z66 Do not resuscitate: Secondary | ICD-10-CM | POA: Diagnosis not present

## 2021-06-13 DIAGNOSIS — K921 Melena: Secondary | ICD-10-CM | POA: Diagnosis not present

## 2021-06-13 DIAGNOSIS — F32A Depression, unspecified: Secondary | ICD-10-CM | POA: Diagnosis present

## 2021-06-13 DIAGNOSIS — Z452 Encounter for adjustment and management of vascular access device: Secondary | ICD-10-CM | POA: Diagnosis not present

## 2021-06-13 DIAGNOSIS — R579 Shock, unspecified: Secondary | ICD-10-CM

## 2021-06-13 DIAGNOSIS — J9811 Atelectasis: Secondary | ICD-10-CM | POA: Diagnosis not present

## 2021-06-13 DIAGNOSIS — E1122 Type 2 diabetes mellitus with diabetic chronic kidney disease: Secondary | ICD-10-CM | POA: Diagnosis not present

## 2021-06-13 DIAGNOSIS — Z794 Long term (current) use of insulin: Secondary | ICD-10-CM | POA: Diagnosis not present

## 2021-06-13 DIAGNOSIS — G4733 Obstructive sleep apnea (adult) (pediatric): Secondary | ICD-10-CM | POA: Diagnosis not present

## 2021-06-13 DIAGNOSIS — J1008 Influenza due to other identified influenza virus with other specified pneumonia: Secondary | ICD-10-CM | POA: Diagnosis not present

## 2021-06-13 DIAGNOSIS — R0602 Shortness of breath: Secondary | ICD-10-CM | POA: Diagnosis not present

## 2021-06-13 DIAGNOSIS — Z21 Asymptomatic human immunodeficiency virus [HIV] infection status: Secondary | ICD-10-CM | POA: Diagnosis present

## 2021-06-13 DIAGNOSIS — D6489 Other specified anemias: Secondary | ICD-10-CM | POA: Diagnosis not present

## 2021-06-13 DIAGNOSIS — B20 Human immunodeficiency virus [HIV] disease: Secondary | ICD-10-CM | POA: Diagnosis not present

## 2021-06-13 DIAGNOSIS — J09X1 Influenza due to identified novel influenza A virus with pneumonia: Secondary | ICD-10-CM | POA: Diagnosis not present

## 2021-06-13 DIAGNOSIS — E870 Hyperosmolality and hypernatremia: Secondary | ICD-10-CM | POA: Diagnosis not present

## 2021-06-13 DIAGNOSIS — Z4659 Encounter for fitting and adjustment of other gastrointestinal appliance and device: Secondary | ICD-10-CM

## 2021-06-13 DIAGNOSIS — J80 Acute respiratory distress syndrome: Secondary | ICD-10-CM | POA: Diagnosis present

## 2021-06-13 DIAGNOSIS — J9621 Acute and chronic respiratory failure with hypoxia: Secondary | ICD-10-CM

## 2021-06-13 DIAGNOSIS — N182 Chronic kidney disease, stage 2 (mild): Secondary | ICD-10-CM | POA: Diagnosis present

## 2021-06-13 DIAGNOSIS — J9620 Acute and chronic respiratory failure, unspecified whether with hypoxia or hypercapnia: Secondary | ICD-10-CM | POA: Diagnosis present

## 2021-06-13 DIAGNOSIS — Z7985 Long-term (current) use of injectable non-insulin antidiabetic drugs: Secondary | ICD-10-CM

## 2021-06-13 DIAGNOSIS — Z7951 Long term (current) use of inhaled steroids: Secondary | ICD-10-CM

## 2021-06-13 DIAGNOSIS — F17210 Nicotine dependence, cigarettes, uncomplicated: Secondary | ICD-10-CM | POA: Diagnosis present

## 2021-06-13 DIAGNOSIS — I517 Cardiomegaly: Secondary | ICD-10-CM | POA: Diagnosis not present

## 2021-06-13 DIAGNOSIS — R069 Unspecified abnormalities of breathing: Secondary | ICD-10-CM

## 2021-06-13 DIAGNOSIS — Z20822 Contact with and (suspected) exposure to covid-19: Secondary | ICD-10-CM | POA: Diagnosis not present

## 2021-06-13 DIAGNOSIS — J441 Chronic obstructive pulmonary disease with (acute) exacerbation: Secondary | ICD-10-CM | POA: Diagnosis present

## 2021-06-13 DIAGNOSIS — R578 Other shock: Secondary | ICD-10-CM | POA: Diagnosis not present

## 2021-06-13 DIAGNOSIS — K760 Fatty (change of) liver, not elsewhere classified: Secondary | ICD-10-CM | POA: Diagnosis present

## 2021-06-13 DIAGNOSIS — K567 Ileus, unspecified: Secondary | ICD-10-CM | POA: Diagnosis not present

## 2021-06-13 DIAGNOSIS — Z9911 Dependence on respirator [ventilator] status: Secondary | ICD-10-CM

## 2021-06-13 DIAGNOSIS — T4275XA Adverse effect of unspecified antiepileptic and sedative-hypnotic drugs, initial encounter: Secondary | ICD-10-CM | POA: Diagnosis not present

## 2021-06-13 DIAGNOSIS — K3189 Other diseases of stomach and duodenum: Secondary | ICD-10-CM | POA: Diagnosis not present

## 2021-06-13 DIAGNOSIS — R609 Edema, unspecified: Secondary | ICD-10-CM | POA: Diagnosis not present

## 2021-06-13 DIAGNOSIS — R140 Abdominal distension (gaseous): Secondary | ICD-10-CM | POA: Diagnosis not present

## 2021-06-13 DIAGNOSIS — R918 Other nonspecific abnormal finding of lung field: Secondary | ICD-10-CM | POA: Diagnosis not present

## 2021-06-13 DIAGNOSIS — K7200 Acute and subacute hepatic failure without coma: Secondary | ICD-10-CM | POA: Diagnosis not present

## 2021-06-13 DIAGNOSIS — Z978 Presence of other specified devices: Secondary | ICD-10-CM

## 2021-06-13 DIAGNOSIS — I4891 Unspecified atrial fibrillation: Secondary | ICD-10-CM | POA: Diagnosis not present

## 2021-06-13 DIAGNOSIS — J984 Other disorders of lung: Secondary | ICD-10-CM | POA: Diagnosis not present

## 2021-06-13 DIAGNOSIS — K269 Duodenal ulcer, unspecified as acute or chronic, without hemorrhage or perforation: Secondary | ICD-10-CM | POA: Diagnosis not present

## 2021-06-13 DIAGNOSIS — E119 Type 2 diabetes mellitus without complications: Secondary | ICD-10-CM | POA: Diagnosis not present

## 2021-06-13 DIAGNOSIS — D62 Acute posthemorrhagic anemia: Secondary | ICD-10-CM | POA: Diagnosis not present

## 2021-06-13 DIAGNOSIS — R6521 Severe sepsis with septic shock: Secondary | ICD-10-CM | POA: Diagnosis not present

## 2021-06-13 DIAGNOSIS — Z515 Encounter for palliative care: Secondary | ICD-10-CM

## 2021-06-13 DIAGNOSIS — J90 Pleural effusion, not elsewhere classified: Secondary | ICD-10-CM | POA: Diagnosis not present

## 2021-06-13 DIAGNOSIS — K6389 Other specified diseases of intestine: Secondary | ICD-10-CM | POA: Diagnosis not present

## 2021-06-13 DIAGNOSIS — I952 Hypotension due to drugs: Secondary | ICD-10-CM | POA: Diagnosis not present

## 2021-06-13 DIAGNOSIS — Z79899 Other long term (current) drug therapy: Secondary | ICD-10-CM

## 2021-06-13 DIAGNOSIS — Z7189 Other specified counseling: Secondary | ICD-10-CM | POA: Diagnosis not present

## 2021-06-13 DIAGNOSIS — Z4682 Encounter for fitting and adjustment of non-vascular catheter: Secondary | ICD-10-CM | POA: Diagnosis not present

## 2021-06-13 DIAGNOSIS — K31811 Angiodysplasia of stomach and duodenum with bleeding: Secondary | ICD-10-CM | POA: Diagnosis present

## 2021-06-13 DIAGNOSIS — R0689 Other abnormalities of breathing: Secondary | ICD-10-CM

## 2021-06-13 DIAGNOSIS — Z6838 Body mass index (BMI) 38.0-38.9, adult: Secondary | ICD-10-CM | POA: Diagnosis not present

## 2021-06-13 DIAGNOSIS — I10 Essential (primary) hypertension: Secondary | ICD-10-CM

## 2021-06-13 DIAGNOSIS — K219 Gastro-esophageal reflux disease without esophagitis: Secondary | ICD-10-CM | POA: Diagnosis not present

## 2021-06-13 DIAGNOSIS — E669 Obesity, unspecified: Secondary | ICD-10-CM | POA: Diagnosis not present

## 2021-06-13 DIAGNOSIS — E43 Unspecified severe protein-calorie malnutrition: Secondary | ICD-10-CM | POA: Diagnosis not present

## 2021-06-13 DIAGNOSIS — K5939 Other megacolon: Secondary | ICD-10-CM | POA: Diagnosis not present

## 2021-06-13 DIAGNOSIS — J151 Pneumonia due to Pseudomonas: Secondary | ICD-10-CM | POA: Diagnosis present

## 2021-06-13 DIAGNOSIS — R000 Tachycardia, unspecified: Secondary | ICD-10-CM | POA: Diagnosis not present

## 2021-06-13 DIAGNOSIS — J811 Chronic pulmonary edema: Secondary | ICD-10-CM | POA: Diagnosis not present

## 2021-06-13 DIAGNOSIS — Z7401 Bed confinement status: Secondary | ICD-10-CM

## 2021-06-13 DIAGNOSIS — J9612 Chronic respiratory failure with hypercapnia: Secondary | ICD-10-CM | POA: Diagnosis not present

## 2021-06-13 DIAGNOSIS — E8881 Metabolic syndrome: Secondary | ICD-10-CM | POA: Diagnosis not present

## 2021-06-13 DIAGNOSIS — I130 Hypertensive heart and chronic kidney disease with heart failure and stage 1 through stage 4 chronic kidney disease, or unspecified chronic kidney disease: Secondary | ICD-10-CM | POA: Diagnosis present

## 2021-06-13 DIAGNOSIS — J439 Emphysema, unspecified: Secondary | ICD-10-CM | POA: Diagnosis present

## 2021-06-13 DIAGNOSIS — F419 Anxiety disorder, unspecified: Secondary | ICD-10-CM | POA: Diagnosis present

## 2021-06-13 DIAGNOSIS — R739 Hyperglycemia, unspecified: Secondary | ICD-10-CM | POA: Diagnosis not present

## 2021-06-13 DIAGNOSIS — D500 Iron deficiency anemia secondary to blood loss (chronic): Secondary | ICD-10-CM | POA: Diagnosis not present

## 2021-06-13 DIAGNOSIS — Z8249 Family history of ischemic heart disease and other diseases of the circulatory system: Secondary | ICD-10-CM

## 2021-06-13 DIAGNOSIS — J9610 Chronic respiratory failure, unspecified whether with hypoxia or hypercapnia: Secondary | ICD-10-CM | POA: Diagnosis not present

## 2021-06-13 DIAGNOSIS — D539 Nutritional anemia, unspecified: Secondary | ICD-10-CM | POA: Diagnosis not present

## 2021-06-13 DIAGNOSIS — K310 Acute dilatation of stomach: Secondary | ICD-10-CM

## 2021-06-13 DIAGNOSIS — I509 Heart failure, unspecified: Secondary | ICD-10-CM | POA: Diagnosis not present

## 2021-06-13 DIAGNOSIS — R0902 Hypoxemia: Secondary | ICD-10-CM | POA: Diagnosis not present

## 2021-06-13 DIAGNOSIS — J129 Viral pneumonia, unspecified: Secondary | ICD-10-CM | POA: Diagnosis not present

## 2021-06-13 DIAGNOSIS — J9602 Acute respiratory failure with hypercapnia: Secondary | ICD-10-CM | POA: Diagnosis not present

## 2021-06-13 DIAGNOSIS — D751 Secondary polycythemia: Secondary | ICD-10-CM | POA: Diagnosis present

## 2021-06-13 DIAGNOSIS — Z743 Need for continuous supervision: Secondary | ICD-10-CM | POA: Diagnosis not present

## 2021-06-13 DIAGNOSIS — R0600 Dyspnea, unspecified: Secondary | ICD-10-CM | POA: Diagnosis not present

## 2021-06-13 DIAGNOSIS — Z833 Family history of diabetes mellitus: Secondary | ICD-10-CM

## 2021-06-13 DIAGNOSIS — A419 Sepsis, unspecified organism: Secondary | ICD-10-CM | POA: Diagnosis not present

## 2021-06-13 DIAGNOSIS — Z7901 Long term (current) use of anticoagulants: Secondary | ICD-10-CM

## 2021-06-13 DIAGNOSIS — E874 Mixed disorder of acid-base balance: Secondary | ICD-10-CM | POA: Diagnosis present

## 2021-06-13 LAB — I-STAT ARTERIAL BLOOD GAS, ED
Acid-Base Excess: 4 mmol/L — ABNORMAL HIGH (ref 0.0–2.0)
Acid-Base Excess: 3 mmol/L — ABNORMAL HIGH (ref 0.0–2.0)
Bicarbonate: 34.1 mmol/L — ABNORMAL HIGH (ref 20.0–28.0)
Bicarbonate: 32.2 mmol/L — ABNORMAL HIGH (ref 20.0–28.0)
Calcium, Ion: 1.19 mmol/L (ref 1.15–1.40)
Calcium, Ion: 1.15 mmol/L (ref 1.15–1.40)
HCT: 46 % (ref 39.0–52.0)
HCT: 46 % (ref 39.0–52.0)
Hemoglobin: 15.6 g/dL (ref 13.0–17.0)
Hemoglobin: 15.6 g/dL (ref 13.0–17.0)
O2 Saturation: 100 %
O2 Saturation: 52 %
Patient temperature: 102
Patient temperature: 99.5
Potassium: 4.3 mmol/L (ref 3.5–5.1)
Potassium: 5.1 mmol/L (ref 3.5–5.1)
Sodium: 134 mmol/L — ABNORMAL LOW (ref 135–145)
Sodium: 134 mmol/L — ABNORMAL LOW (ref 135–145)
TCO2: 36 mmol/L — ABNORMAL HIGH (ref 22–32)
TCO2: 34 mmol/L — ABNORMAL HIGH (ref 22–32)
pCO2 arterial: 79.1 mmHg (ref 32.0–48.0)
pCO2 arterial: 71.9 mmHg (ref 32.0–48.0)
pH, Arterial: 7.252 — ABNORMAL LOW (ref 7.350–7.450)
pH, Arterial: 7.262 — ABNORMAL LOW (ref 7.350–7.450)
pO2, Arterial: 463 mmHg — ABNORMAL HIGH (ref 83.0–108.0)
pO2, Arterial: 34 mmHg — CL (ref 83.0–108.0)

## 2021-06-13 LAB — POCT I-STAT EG7
Acid-Base Excess: 2 mmol/L (ref 0.0–2.0)
Bicarbonate: 33.5 mmol/L — ABNORMAL HIGH (ref 20.0–28.0)
Calcium, Ion: 1.11 mmol/L — ABNORMAL LOW (ref 1.15–1.40)
HCT: 44 % (ref 39.0–52.0)
Hemoglobin: 15 g/dL (ref 13.0–17.0)
O2 Saturation: 82 %
Patient temperature: 98.6
Potassium: 7 mmol/L (ref 3.5–5.1)
Sodium: 130 mmol/L — ABNORMAL LOW (ref 135–145)
TCO2: 36 mmol/L — ABNORMAL HIGH (ref 22–32)
pCO2, Ven: 89.1 mmHg (ref 44.0–60.0)
pH, Ven: 7.183 — CL (ref 7.250–7.430)
pO2, Ven: 60 mmHg — ABNORMAL HIGH (ref 32.0–45.0)

## 2021-06-13 LAB — BASIC METABOLIC PANEL
Anion gap: 6 (ref 5–15)
BUN: 35 mg/dL — ABNORMAL HIGH (ref 6–20)
CO2: 30 mmol/L (ref 22–32)
Calcium: 8.2 mg/dL — ABNORMAL LOW (ref 8.9–10.3)
Chloride: 93 mmol/L — ABNORMAL LOW (ref 98–111)
Creatinine, Ser: 2.12 mg/dL — ABNORMAL HIGH (ref 0.61–1.24)
GFR, Estimated: 36 mL/min — ABNORMAL LOW (ref >60)
Glucose, Bld: 345 mg/dL — ABNORMAL HIGH (ref 70–99)
Potassium: 7.2 mmol/L (ref 3.5–5.1)
Sodium: 129 mmol/L — ABNORMAL LOW (ref 135–145)

## 2021-06-13 LAB — POCT I-STAT 7, (LYTES, BLD GAS, ICA,H+H)
Acid-Base Excess: 4 mmol/L — ABNORMAL HIGH (ref 0.0–2.0)
Acid-Base Excess: 6 mmol/L — ABNORMAL HIGH (ref 0.0–2.0)
Bicarbonate: 36.4 mmol/L — ABNORMAL HIGH (ref 20.0–28.0)
Bicarbonate: 36.3 mmol/L — ABNORMAL HIGH (ref 20.0–28.0)
Calcium, Ion: 1.16 mmol/L (ref 1.15–1.40)
Calcium, Ion: 1.09 mmol/L — ABNORMAL LOW (ref 1.15–1.40)
HCT: 47 % (ref 39.0–52.0)
HCT: 43 % (ref 39.0–52.0)
Hemoglobin: 16 g/dL (ref 13.0–17.0)
Hemoglobin: 14.6 g/dL (ref 13.0–17.0)
O2 Saturation: 100 %
O2 Saturation: 48 %
Patient temperature: 97.6
Patient temperature: 98.6
Potassium: 6.4 mmol/L (ref 3.5–5.1)
Potassium: 4.8 mmol/L (ref 3.5–5.1)
Sodium: 131 mmol/L — ABNORMAL LOW (ref 135–145)
Sodium: 134 mmol/L — ABNORMAL LOW (ref 135–145)
TCO2: 39 mmol/L — ABNORMAL HIGH (ref 22–32)
TCO2: 39 mmol/L — ABNORMAL HIGH (ref 22–32)
pCO2 arterial: 92.8 mmHg (ref 32.0–48.0)
pCO2 arterial: 83.6 mmHg (ref 32.0–48.0)
pH, Arterial: 7.198 — CL (ref 7.350–7.450)
pH, Arterial: 7.246 — ABNORMAL LOW (ref 7.350–7.450)
pO2, Arterial: 383 mmHg — ABNORMAL HIGH (ref 83.0–108.0)
pO2, Arterial: 32 mmHg — CL (ref 83.0–108.0)

## 2021-06-13 LAB — CBC WITH DIFFERENTIAL/PLATELET
Abs Immature Granulocytes: 0.09 10*3/uL — ABNORMAL HIGH (ref 0.00–0.07)
Basophils Absolute: 0.1 10*3/uL (ref 0.0–0.1)
Basophils Relative: 0 %
Eosinophils Absolute: 0.1 10*3/uL (ref 0.0–0.5)
Eosinophils Relative: 1 %
HCT: 45.7 % (ref 39.0–52.0)
Hemoglobin: 14.1 g/dL (ref 13.0–17.0)
Immature Granulocytes: 1 %
Lymphocytes Relative: 16 %
Lymphs Abs: 2.2 10*3/uL (ref 0.7–4.0)
MCH: 29 pg (ref 26.0–34.0)
MCHC: 30.9 g/dL (ref 30.0–36.0)
MCV: 93.8 fL (ref 80.0–100.0)
Monocytes Absolute: 1.5 10*3/uL — ABNORMAL HIGH (ref 0.1–1.0)
Monocytes Relative: 11 %
Neutro Abs: 9.8 10*3/uL — ABNORMAL HIGH (ref 1.7–7.7)
Neutrophils Relative %: 71 %
Platelets: 207 10*3/uL (ref 150–400)
RBC: 4.87 MIL/uL (ref 4.22–5.81)
RDW: 12.1 % (ref 11.5–15.5)
WBC: 13.7 10*3/uL — ABNORMAL HIGH (ref 4.0–10.5)
nRBC: 0 % (ref 0.0–0.2)

## 2021-06-13 LAB — COMPREHENSIVE METABOLIC PANEL
AG Ratio: 1.2 (calc) (ref 1.0–2.5)
ALT: 19 U/L (ref 9–46)
AST: 17 U/L (ref 10–35)
Albumin: 4.2 g/dL (ref 3.6–5.1)
Alkaline phosphatase (APISO): 51 U/L (ref 35–144)
BUN: 16 mg/dL (ref 7–25)
CO2: 32 mmol/L (ref 20–32)
Calcium: 9.8 mg/dL (ref 8.6–10.3)
Chloride: 99 mmol/L (ref 98–110)
Creat: 1.19 mg/dL (ref 0.70–1.30)
Globulin: 3.6 g/dL (calc) (ref 1.9–3.7)
Glucose, Bld: 147 mg/dL — ABNORMAL HIGH (ref 65–99)
Potassium: 4.4 mmol/L (ref 3.5–5.3)
Sodium: 137 mmol/L (ref 135–146)
Total Bilirubin: 0.3 mg/dL (ref 0.2–1.2)
Total Protein: 7.8 g/dL (ref 6.1–8.1)

## 2021-06-13 LAB — LIPID PANEL
Cholesterol: 231 mg/dL — ABNORMAL HIGH (ref <200)
HDL: 40 mg/dL (ref >=40)
LDL Cholesterol (Calc): 164 mg/dL (calc) — ABNORMAL HIGH
Non-HDL Cholesterol (Calc): 191 mg/dL (calc) — ABNORMAL HIGH (ref <130)
Total CHOL/HDL Ratio: 5.8 (calc) — ABNORMAL HIGH (ref <5.0)
Triglycerides: 137 mg/dL (ref <150)

## 2021-06-13 LAB — GLUCOSE, CAPILLARY
Glucose-Capillary: 242 mg/dL — ABNORMAL HIGH (ref 70–99)
Glucose-Capillary: 297 mg/dL — ABNORMAL HIGH (ref 70–99)
Glucose-Capillary: 305 mg/dL — ABNORMAL HIGH (ref 70–99)
Glucose-Capillary: 343 mg/dL — ABNORMAL HIGH (ref 70–99)

## 2021-06-13 LAB — HEMOGLOBIN A1C
Hgb A1c MFr Bld: 6.8 % — ABNORMAL HIGH (ref 4.8–5.6)
Mean Plasma Glucose: 148.46 mg/dL

## 2021-06-13 LAB — I-STAT CHEM 8, ED
BUN: 21 mg/dL — ABNORMAL HIGH (ref 6–20)
Calcium, Ion: 1.03 mmol/L — ABNORMAL LOW (ref 1.15–1.40)
Chloride: 99 mmol/L (ref 98–111)
Creatinine, Ser: 1.2 mg/dL (ref 0.61–1.24)
Glucose, Bld: 194 mg/dL — ABNORMAL HIGH (ref 70–99)
HCT: 49 % (ref 39.0–52.0)
Hemoglobin: 16.7 g/dL (ref 13.0–17.0)
Potassium: 4.6 mmol/L (ref 3.5–5.1)
Sodium: 135 mmol/L (ref 135–145)
TCO2: 27 mmol/L (ref 22–32)

## 2021-06-13 LAB — TROPONIN I (HIGH SENSITIVITY)
Troponin I (High Sensitivity): 10 ng/L (ref <18)
Troponin I (High Sensitivity): 13 ng/L (ref <18)

## 2021-06-13 LAB — RESP PANEL BY RT-PCR (FLU A&B, COVID) ARPGX2
Influenza A by PCR: POSITIVE — AB
Influenza B by PCR: NEGATIVE
SARS Coronavirus 2 by RT PCR: NEGATIVE

## 2021-06-13 LAB — HIV-1 RNA QUANT-NO REFLEX-BLD
HIV 1 RNA Quant: <20 Copies/mL — ABNORMAL HIGH
HIV-1 RNA Quant, Log: <1.3 Log cps/mL — ABNORMAL HIGH

## 2021-06-13 MED ORDER — ONDANSETRON HCL 4 MG/2ML IJ SOLN
4.0000 mg | Freq: Once | INTRAMUSCULAR | Status: AC
  Administered 2021-06-13: 4 mg via INTRAVENOUS
  Filled 2021-06-13: qty 2

## 2021-06-13 MED ORDER — INSULIN ASPART 100 UNIT/ML IJ SOLN: 0.0000 [IU] | Freq: Four times a day (QID) | INTRAMUSCULAR | Status: DC | PRN

## 2021-06-13 MED ORDER — INSULIN ASPART 100 UNIT/ML IJ SOLN: 0.0000 [IU] | Freq: Three times a day (TID) | INTRAMUSCULAR | Status: DC

## 2021-06-13 MED ORDER — DOCUSATE SODIUM 50 MG/5ML PO LIQD
100.0000 mg | Freq: Two times a day (BID) | ORAL | Status: DC | PRN
  Administered 2021-06-15: 100 mg

## 2021-06-13 MED ORDER — APIXABAN 5 MG PO TABS
5.0000 mg | ORAL_TABLET | Freq: Two times a day (BID) | ORAL | Status: DC
Start: 2021-06-14 — End: 2021-06-17
  Administered 2021-06-14 – 2021-06-17 (×7): 5 mg
  Filled 2021-06-13 (×7): qty 1

## 2021-06-13 MED ORDER — IPRATROPIUM-ALBUTEROL 0.5-2.5 (3) MG/3ML IN SOLN
3.0000 mL | Freq: Four times a day (QID) | RESPIRATORY_TRACT | Status: DC
  Administered 2021-06-13 – 2021-06-20 (×31): 3 mL via RESPIRATORY_TRACT
  Filled 2021-06-13 (×31): qty 3

## 2021-06-13 MED ORDER — BICTEGRAVIR-EMTRICITAB-TENOFOV 50-200-25 MG PO TABS
1.0000 | ORAL_TABLET | Freq: Every day | ORAL | Status: DC
  Filled 2021-06-13 (×2): qty 1

## 2021-06-13 MED ORDER — CHLORHEXIDINE GLUCONATE 0.12% ORAL RINSE (MEDLINE KIT)
15.0000 mL | Freq: Two times a day (BID) | OROMUCOSAL | Status: DC
  Administered 2021-06-13 – 2021-07-05 (×43): 15 mL via OROMUCOSAL

## 2021-06-13 MED ORDER — OSELTAMIVIR PHOSPHATE 75 MG PO CAPS
75.0000 mg | ORAL_CAPSULE | Freq: Two times a day (BID) | ORAL | Status: DC
  Administered 2021-06-13 (×2): 75 mg via ORAL
  Filled 2021-06-13 (×2): qty 1

## 2021-06-13 MED ORDER — NICOTINE 14 MG/24HR TD PT24
14.0000 mg | MEDICATED_PATCH | Freq: Every day | TRANSDERMAL | Status: DC
  Administered 2021-06-14 – 2021-06-18 (×5): 14 mg via TRANSDERMAL
  Filled 2021-06-13 (×6): qty 1

## 2021-06-13 MED ORDER — FENTANYL CITRATE (PF) 100 MCG/2ML IJ SOLN
50.0000 ug | INTRAMUSCULAR | Status: DC | PRN
  Administered 2021-06-13 – 2021-06-14 (×7): 100 ug via INTRAVENOUS
  Filled 2021-06-13 (×6): qty 2

## 2021-06-13 MED ORDER — BUDESONIDE 0.25 MG/2ML IN SUSP
0.2500 mg | Freq: Two times a day (BID) | RESPIRATORY_TRACT | Status: DC
  Administered 2021-06-13 – 2021-07-04 (×44): 0.25 mg via RESPIRATORY_TRACT
  Filled 2021-06-13 (×45): qty 2

## 2021-06-13 MED ORDER — FUROSEMIDE 10 MG/ML IJ SOLN
40.0000 mg | Freq: Once | INTRAMUSCULAR | Status: AC
  Administered 2021-06-13: 40 mg via INTRAVENOUS
  Filled 2021-06-13: qty 4

## 2021-06-13 MED ORDER — SODIUM BICARBONATE 8.4 % IV SOLN
100.0000 meq | Freq: Once | INTRAVENOUS | Status: AC
  Administered 2021-06-13: 100 meq via INTRAVENOUS
  Filled 2021-06-13: qty 100

## 2021-06-13 MED ORDER — ALBUTEROL SULFATE (2.5 MG/3ML) 0.083% IN NEBU
2.5000 mg | INHALATION_SOLUTION | RESPIRATORY_TRACT | Status: DC | PRN
  Administered 2021-06-17 – 2021-07-04 (×5): 2.5 mg via RESPIRATORY_TRACT
  Filled 2021-06-13 (×5): qty 3

## 2021-06-13 MED ORDER — SENNOSIDES-DOCUSATE SODIUM 8.6-50 MG PO TABS: 1.0000 | ORAL_TABLET | Freq: Every evening | ORAL | Status: DC | PRN

## 2021-06-13 MED ORDER — MIDAZOLAM HCL 2 MG/2ML IJ SOLN
INTRAMUSCULAR | Status: AC
  Filled 2021-06-13: qty 2

## 2021-06-13 MED ORDER — ORAL CARE MOUTH RINSE: 15.0000 mL | OROMUCOSAL | Status: DC

## 2021-06-13 MED ORDER — SODIUM CHLORIDE 0.9 % IV SOLN
250.0000 mL | INTRAVENOUS | Status: DC
  Administered 2021-06-13: 250 mL via INTRAVENOUS

## 2021-06-13 MED ORDER — ACETAMINOPHEN 650 MG RE SUPP: 650.0000 mg | Freq: Four times a day (QID) | RECTAL | Status: DC | PRN

## 2021-06-13 MED ORDER — SUCCINYLCHOLINE CHLORIDE 200 MG/10ML IV SOSY
PREFILLED_SYRINGE | INTRAVENOUS | Status: AC
  Filled 2021-06-13: qty 10

## 2021-06-13 MED ORDER — DOCUSATE SODIUM 100 MG PO CAPS: 100.0000 mg | ORAL_CAPSULE | Freq: Two times a day (BID) | ORAL | Status: DC | PRN

## 2021-06-13 MED ORDER — ROSUVASTATIN CALCIUM 20 MG PO TABS
20.0000 mg | ORAL_TABLET | Freq: Every day | ORAL | Status: DC
  Filled 2021-06-13: qty 1

## 2021-06-13 MED ORDER — ACETAMINOPHEN 325 MG PO TABS: 650.0000 mg | ORAL_TABLET | Freq: Four times a day (QID) | ORAL | Status: DC | PRN

## 2021-06-13 MED ORDER — SODIUM CHLORIDE 0.9% FLUSH
3.0000 mL | Freq: Two times a day (BID) | INTRAVENOUS | Status: DC
  Administered 2021-06-13 – 2021-07-05 (×38): 3 mL via INTRAVENOUS

## 2021-06-13 MED ORDER — POLYETHYLENE GLYCOL 3350 17 G PO PACK: 17.0000 g | PACK | Freq: Every day | ORAL | Status: DC | PRN

## 2021-06-13 MED ORDER — SODIUM CHLORIDE 0.9% FLUSH
3.0000 mL | INTRAVENOUS | Status: DC | PRN
  Administered 2021-06-15: 3 mL via INTRAVENOUS

## 2021-06-13 MED ORDER — ETOMIDATE 2 MG/ML IV SOLN
INTRAVENOUS | Status: AC
  Administered 2021-06-13: 20 mg
  Filled 2021-06-13: qty 20

## 2021-06-13 MED ORDER — METFORMIN HCL 500 MG PO TABS: 1000.0000 mg | ORAL_TABLET | Freq: Two times a day (BID) | ORAL | Status: DC

## 2021-06-13 MED ORDER — LOSARTAN POTASSIUM 25 MG PO TABS
12.5000 mg | ORAL_TABLET | Freq: Every day | ORAL | Status: DC
  Filled 2021-06-13: qty 0.5

## 2021-06-13 MED ORDER — ORAL CARE MOUTH RINSE
15.0000 mL | OROMUCOSAL | Status: DC
  Administered 2021-06-13 – 2021-07-05 (×213): 15 mL via OROMUCOSAL

## 2021-06-13 MED ORDER — ACETAMINOPHEN 500 MG PO TABS
1000.0000 mg | ORAL_TABLET | Freq: Once | ORAL | Status: AC
  Administered 2021-06-13: 1000 mg via ORAL
  Filled 2021-06-13: qty 2

## 2021-06-13 MED ORDER — DEXMEDETOMIDINE HCL IN NACL 400 MCG/100ML IV SOLN
0.4000 ug/kg/h | INTRAVENOUS | Status: DC
  Administered 2021-06-13: 0.6 ug/kg/h via INTRAVENOUS
  Administered 2021-06-14 (×2): 1 ug/kg/h via INTRAVENOUS
  Administered 2021-06-14: 0.9 ug/kg/h via INTRAVENOUS
  Administered 2021-06-14 (×2): 1 ug/kg/h via INTRAVENOUS
  Administered 2021-06-14: 1.1 ug/kg/h via INTRAVENOUS
  Administered 2021-06-14 – 2021-06-15 (×8): 1.2 ug/kg/h via INTRAVENOUS
  Administered 2021-06-16: 1.1 ug/kg/h via INTRAVENOUS
  Administered 2021-06-16 (×3): 1.2 ug/kg/h via INTRAVENOUS
  Administered 2021-06-16: 0.5 ug/kg/h via INTRAVENOUS
  Filled 2021-06-13 (×14): qty 100
  Filled 2021-06-13: qty 200
  Filled 2021-06-13: qty 100
  Filled 2021-06-13 (×2): qty 200

## 2021-06-13 MED ORDER — APIXABAN 5 MG PO TABS
5.0000 mg | ORAL_TABLET | Freq: Two times a day (BID) | ORAL | Status: DC
  Administered 2021-06-13: 5 mg via ORAL
  Filled 2021-06-13 (×2): qty 1

## 2021-06-13 MED ORDER — DILTIAZEM LOAD VIA INFUSION
15.0000 mg | Freq: Once | INTRAVENOUS | Status: AC
  Administered 2021-06-13: 15 mg via INTRAVENOUS
  Filled 2021-06-13: qty 15

## 2021-06-13 MED ORDER — SODIUM CHLORIDE 0.9 % IV SOLN: 250.0000 mL | INTRAVENOUS | Status: DC | PRN

## 2021-06-13 MED ORDER — ONDANSETRON HCL 4 MG/2ML IJ SOLN: 4.0000 mg | Freq: Four times a day (QID) | INTRAMUSCULAR | Status: DC | PRN

## 2021-06-13 MED ORDER — SODIUM ZIRCONIUM CYCLOSILICATE 10 G PO PACK
10.0000 g | PACK | Freq: Once | ORAL | Status: AC
  Administered 2021-06-13: 10 g
  Filled 2021-06-13: qty 1

## 2021-06-13 MED ORDER — FUROSEMIDE 20 MG PO TABS: 40.0000 mg | ORAL_TABLET | Freq: Two times a day (BID) | ORAL | Status: DC

## 2021-06-13 MED ORDER — ONDANSETRON HCL 4 MG PO TABS: 4.0000 mg | ORAL_TABLET | Freq: Four times a day (QID) | ORAL | Status: DC | PRN

## 2021-06-13 MED ORDER — OSELTAMIVIR PHOSPHATE 75 MG PO CAPS
75.0000 mg | ORAL_CAPSULE | Freq: Once | ORAL | Status: AC
  Administered 2021-06-13: 75 mg via ORAL
  Filled 2021-06-13: qty 1

## 2021-06-13 MED ORDER — CHLORHEXIDINE GLUCONATE CLOTH 2 % EX PADS
6.0000 | MEDICATED_PAD | Freq: Every day | CUTANEOUS | Status: DC
  Administered 2021-06-13 – 2021-06-30 (×17): 6 via TOPICAL

## 2021-06-13 MED ORDER — INSULIN ASPART 100 UNIT/ML IV SOLN
10.0000 [IU] | Freq: Once | INTRAVENOUS | Status: AC
  Administered 2021-06-13: 10 [IU] via INTRAVENOUS

## 2021-06-13 MED ORDER — INSULIN ASPART 100 UNIT/ML IJ SOLN
0.0000 [IU] | INTRAMUSCULAR | Status: DC
Start: 2021-06-13 — End: 2021-06-18
  Administered 2021-06-13: 7 [IU] via SUBCUTANEOUS
  Administered 2021-06-13: 4 [IU] via SUBCUTANEOUS
  Administered 2021-06-13: 15 [IU] via SUBCUTANEOUS
  Administered 2021-06-13: 11 [IU] via SUBCUTANEOUS
  Administered 2021-06-14 (×2): 4 [IU] via SUBCUTANEOUS
  Administered 2021-06-14: 7 [IU] via SUBCUTANEOUS
  Administered 2021-06-14: 3 [IU] via SUBCUTANEOUS
  Administered 2021-06-14 – 2021-06-15 (×3): 4 [IU] via SUBCUTANEOUS
  Administered 2021-06-15: 11 [IU] via SUBCUTANEOUS
  Administered 2021-06-15: 15 [IU] via SUBCUTANEOUS
  Administered 2021-06-15: 11 [IU] via SUBCUTANEOUS
  Administered 2021-06-15: 4 [IU] via SUBCUTANEOUS
  Administered 2021-06-16: 15 [IU] via SUBCUTANEOUS
  Administered 2021-06-16 (×2): 11 [IU] via SUBCUTANEOUS
  Administered 2021-06-16: 4 [IU] via SUBCUTANEOUS
  Administered 2021-06-16: 7 [IU] via SUBCUTANEOUS
  Administered 2021-06-17: 11 [IU] via SUBCUTANEOUS
  Administered 2021-06-17 (×2): 7 [IU] via SUBCUTANEOUS
  Administered 2021-06-18 (×2): 15 [IU] via SUBCUTANEOUS

## 2021-06-13 MED ORDER — NOREPINEPHRINE 4 MG/250ML-% IV SOLN
INTRAVENOUS | Status: AC
  Administered 2021-06-13: 5 ug/min via INTRAVENOUS
  Filled 2021-06-13: qty 250

## 2021-06-13 MED ORDER — PANTOPRAZOLE SODIUM 40 MG IV SOLR
40.0000 mg | Freq: Every day | INTRAVENOUS | Status: DC
  Administered 2021-06-13 – 2021-06-17 (×5): 40 mg via INTRAVENOUS
  Filled 2021-06-13 (×5): qty 40

## 2021-06-13 MED ORDER — CALCIUM GLUCONATE 10 % IV SOLN
1.0000 g | Freq: Once | INTRAVENOUS | Status: DC
  Filled 2021-06-13: qty 10

## 2021-06-13 MED ORDER — NOREPINEPHRINE 4 MG/250ML-% IV SOLN: 0.0000 ug/min | INTRAVENOUS | Status: DC

## 2021-06-13 MED ORDER — CALCIUM GLUCONATE-NACL 1-0.675 GM/50ML-% IV SOLN
1.0000 g | Freq: Once | INTRAVENOUS | Status: AC
  Administered 2021-06-13: 1000 mg via INTRAVENOUS
  Filled 2021-06-13: qty 50

## 2021-06-13 MED ORDER — FENTANYL CITRATE (PF) 100 MCG/2ML IJ SOLN
INTRAMUSCULAR | Status: AC
  Filled 2021-06-13: qty 2

## 2021-06-13 MED ORDER — ALBUTEROL SULFATE (2.5 MG/3ML) 0.083% IN NEBU
5.0000 mg | INHALATION_SOLUTION | Freq: Once | RESPIRATORY_TRACT | Status: DC
  Filled 2021-06-13: qty 6

## 2021-06-13 MED ORDER — BUDESON-GLYCOPYRROL-FORMOTEROL 160-9-4.8 MCG/ACT IN AERO: 2.0000 | INHALATION_SPRAY | Freq: Two times a day (BID) | RESPIRATORY_TRACT | Status: DC

## 2021-06-13 MED ORDER — ROSUVASTATIN CALCIUM 20 MG PO TABS
20.0000 mg | ORAL_TABLET | Freq: Every day | ORAL | Status: DC
  Administered 2021-06-14 – 2021-07-05 (×22): 20 mg
  Filled 2021-06-13 (×22): qty 1

## 2021-06-13 MED ORDER — ACETAMINOPHEN 325 MG PO TABS
650.0000 mg | ORAL_TABLET | Freq: Four times a day (QID) | ORAL | Status: DC | PRN
  Administered 2021-06-16 – 2021-07-03 (×10): 650 mg
  Filled 2021-06-13 (×10): qty 2

## 2021-06-13 MED ORDER — ROCURONIUM BROMIDE 10 MG/ML (PF) SYRINGE
PREFILLED_SYRINGE | INTRAVENOUS | Status: AC
  Administered 2021-06-13: 100 mg
  Filled 2021-06-13: qty 10

## 2021-06-13 MED ORDER — CHLORHEXIDINE GLUCONATE 0.12% ORAL RINSE (MEDLINE KIT): 15.0000 mL | Freq: Two times a day (BID) | OROMUCOSAL | Status: DC

## 2021-06-13 MED ORDER — NOREPINEPHRINE 4 MG/250ML-% IV SOLN
0.0000 ug/min | INTRAVENOUS | Status: DC
  Filled 2021-06-13: qty 250

## 2021-06-13 MED ORDER — DEXTROSE 50 % IV SOLN
1.0000 | Freq: Once | INTRAVENOUS | Status: AC
  Administered 2021-06-13: 50 mL via INTRAVENOUS
  Filled 2021-06-13: qty 50

## 2021-06-13 MED ORDER — LORAZEPAM 2 MG/ML IJ SOLN: 0.5000 mg | Freq: Four times a day (QID) | INTRAMUSCULAR | Status: DC | PRN

## 2021-06-13 MED ORDER — LORAZEPAM 2 MG/ML IJ SOLN
INTRAMUSCULAR | Status: AC
  Administered 2021-06-13: 0.5 mg via INTRAVENOUS
  Filled 2021-06-13: qty 1

## 2021-06-13 MED ORDER — METHYLPREDNISOLONE SODIUM SUCC 125 MG IJ SOLR
125.0000 mg | Freq: Two times a day (BID) | INTRAMUSCULAR | Status: DC
  Administered 2021-06-13 (×2): 125 mg via INTRAVENOUS
  Filled 2021-06-13 (×3): qty 2

## 2021-06-13 MED ORDER — DILTIAZEM HCL-DEXTROSE 125-5 MG/125ML-% IV SOLN (PREMIX)
5.0000 mg/h | INTRAVENOUS | Status: DC
  Administered 2021-06-13: 10 mg/h via INTRAVENOUS
  Administered 2021-06-13: 5 mg/h via INTRAVENOUS
  Filled 2021-06-13 (×4): qty 125

## 2021-06-13 MED ORDER — OSELTAMIVIR PHOSPHATE 30 MG PO CAPS
30.0000 mg | ORAL_CAPSULE | Freq: Two times a day (BID) | ORAL | Status: DC
  Administered 2021-06-14 – 2021-06-15 (×3): 30 mg
  Filled 2021-06-13 (×4): qty 1

## 2021-06-13 NOTE — Progress Notes (Signed)
Unable to obtain ABG, IV team also in room, so VBG pulled by IV team. Results called to Elink.
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
## 2021-06-13 NOTE — Progress Notes (Signed)
eLink Physician-Brief Progress Note Patient Name: Simuel Stebner. DOB: 1965-11-13 MRN: [id number]   Date of Service  06/30/2021  HPI/Events of Note  VBG on 60%/PRVC 28/TV 480/P 8 = 7.183/89.1/60.0/33.5. Examination of Flow/Time curve on ventilator reveals evidence of air trapping. Air trapping improved by decreasing PRVC rate to 24.  eICU Interventions  Plan: Decrease PRVC rate to 24. Repeat ABG at 10:30 PM. Will request PCCM ground team evaluate the ventilator at bedside.      Intervention Category Major Interventions: Acid-Base disturbance - evaluation and management;Respiratory failure - evaluation and management  Lenell Antu 06/12/2021, 9:19 PM

## 2021-06-13 NOTE — ED Triage Notes (Signed)
Pt brought in by EMS from home for shortness of breath. Pt has a history of asthma and COPD. Pt receiving a duo-neb upon arrival to the ED.
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
## 2021-06-13 NOTE — Progress Notes (Signed)
eLink Physician-Brief Progress Note Patient Name: Chris [name]. DOB: September 14, 1965 MRN: [id number]   Date of Service  06/20/2021  HPI/Events of Note  Hyperkalemia - K+ = 7.2.  eICU Interventions  Plan: NaHCO3 100 meq IV now. Calcium gluconate 1 gm IV now. D50 1 amp now. Novolog 10 units IV now. Lokelma 10 gm per tube now. Repeat BMP at 2 AM.     Intervention Category Major Interventions: Electrolyte abnormality - evaluation and management  Lenell Antu 06/30/2021, 9:33 PM

## 2021-06-13 NOTE — Procedures (Signed)
Intubation Procedure Note Elisa Sorlie [id number] 1966/01/05  Procedure: Intubation Indications: Airway protection and maintenance  Procedure Details Consent: Risks of procedure as well as the alternatives and risks of each were explained to the (patient/caregiver).  Consent for procedure obtained. Time Out: Verified patient identification, verified procedure, site/side was marked, verified correct patient position, special equipment/implants available, medications/allergies/relevent history reviewed, required imaging and test results available.  Performed   Evaluation Hemodynamic Status: BP stable throughout; O2 sats: stable throughout Patient's Current Condition: stable Complications: No apparent complications Patient did tolerate procedure well. Chest X-ray ordered to verify placement.  CXR: pending.  Procedure performed under direct supervision and guidance of Dr Marlowe Shores, MD Internal Medicine, PGY-3 06/17/2021 4:36 PM Pager # [phone]
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
## 2021-06-13 NOTE — Progress Notes (Signed)
PHARMACY NOTE:  ANTIMICROBIAL RENAL DOSAGE ADJUSTMENT  Current antimicrobial regimen includes a mismatch between antimicrobial dosage and estimated renal function.  As per policy approved by the Pharmacy & Therapeutics and Medical Executive Committees, the antimicrobial dosage will be adjusted accordingly.  Current antimicrobial dosage:  Oseltamivir (Tamiflu) 75 mg per tube BID  Indication: Influenza treatment  Renal Function:  Estimated Creatinine Clearance: 48.6 mL/min (A) (by C-G formula based on SCr of 2.12 mg/dL (H)). []      On intermittent HD, scheduled: []      On CRRT    Antimicrobial dosage has been changed to:  Oseltamivir 30 mg per tube BID (pt has 4 days of treatment remaining)  Thank you for allowing pharmacy to be a part of this patient's care.  , PharmD, BCPS, [facility] Clinical Pharmacist 06/24/2021 10:27 PM

## 2021-06-13 NOTE — Progress Notes (Signed)
PT transported from ED to 2M08 on BiPAP without any complications.

## 2021-06-13 NOTE — Progress Notes (Signed)
SLP Cancellation Note  Patient Details Name: Chris [name]. MRN: [id number] DOB: 1965/12/14   Cancelled treatment:        Received order for swallow eval. Pt currently on Bipap. Will plan to see tomorrow.    Royce Macadamia 07-11-21, 1:52 PM

## 2021-06-13 NOTE — Progress Notes (Signed)
eLink Physician-Brief Progress Note Patient Name: Chris [name]. DOB: April 26, 1966 MRN: [id number]   Date of Service  06/19/2021  HPI/Events of Note  VBG on 60%/PRVC 24/TV 480/P 8 = 7.246/83.6/32/36.3. Sat by bedside oximety = 91%.  eICU Interventions  Continue present management.      Intervention Category Major Interventions: Respiratory failure - evaluation and management  Verita Kuroda Eugene 07/04/2021, 11:57 PM

## 2021-06-13 NOTE — Progress Notes (Signed)
ABG sample collected, results are mixed venous.

## 2021-06-13 NOTE — ED Notes (Signed)
Wife called and notified of pt going to room.

## 2021-06-13 NOTE — Progress Notes (Addendum)
PROGRESS NOTE        PATIENT DETAILS Name: Chris [name]. Age: 55 y.o. Sex: male Date of Birth: 01/25/1966 Admit Date: 06/26/2021 Admitting Physician Carlton Adam, MD RKY:[id number], Shea Stakes, NP  Brief Narrative: Patient is a 55 y.o. male with history of HIV on ART, PAF, chronic hypoxemic respiratory failure on as needed home O2, COPD-presented to the ED with 2-3-day history of cough/shortness of breath-patient was found to have influenza and acute on chronic hypoxic/hypercarbic respiratory failure-started on BiPAP and admitted to the hospitalist service.  Subjective: Briefly liberated off BiPAP-still somewhat drowsy.  Requiring anywhere from 6 to 8 L of oxygen to maintain O2 sats of BiPAP.  Repeat ABG showed persistent hypercarbia-and placed back on BiPAP and PCCM consulted.  Objective: Vitals: Blood pressure (!) 148/92, pulse (!) 122, temperature 99.5 F (37.5 C), resp. rate (!) 27, height 5\' 9"  (1.753 m), weight 112 kg, SpO2 93 %.   Exam: Gen Exam: Mild to moderate respiratory distress off BiPAP HEENT:atraumatic, normocephalic Chest: Diminished air entry at bases-coarse rhonchi all over. CVS:S1S2 irregular Abdomen:soft non tender, non distended Extremities:no edema Neurology: Non focal Skin: no rash  Pertinent Labs/Radiology: Recent Labs  Lab 06/10/21 1029 06/10/21 1029 06/19/2021 0412 06/14/2021 0420 07/09/2021 0455  WBC  --   --  13.7*  --   --   HGB  --    < > 14.1 16.7 15.6  PLT  --   --  207  --   --   NA 137  --   --  135 134*  K 4.4  --   --  4.6 4.3  CREATININE 1.19  --   --  1.20  --   AST 17  --   --   --   --   ALT 19  --   --   --   --   BILITOT 0.3  --   --   --   --    < > = values in this interval not displayed.   Arterial Blood Gas result:  pO2 34; pCO2 71.9; pH 7.262  11/3>>CXR: No PNA   Assessment/Plan: Acute on chronic hypoxic and hypercarbic respiratory failure in the setting of COPD exacerbation due to influenza:  Briefly liberated off of BiPAP-still somewhat lethargic-repeat ABG off BiPAP showed persistent hypercarbia with respiratory acidosis-and subsequently placed back on BiPAP earlier this morning-he is at high risk for intubation-hence have consulted PCCM.  Continue IV steroids/Tamiflu/scheduled bronchodilators.  Will likely need to be watched closely in the ICU-we will await further recommendations from PCCM.  A. fib with RVR: RVR likely provoked by acute infection-remains in A. fib with heart rate in the low 100s-continue Cardizem infusion-on Eliquis as CHA2DS2-VASc score of 3.  Obtain echo.  Chronic diastolic heart failure: Euvolemic-on oral Lasix.  HTN: BP stable-continue losartan-also on Cardizem infusion.  Follow and adjust.  HIV (CD4 count of 362 on 10/31): Continue ART  DM-2 (A1c 6.8 on 11/3): Watch CBGs closely on SSI.  Continue to hold all oral hypoglycemic agents.  No results for input(s): GLUCAP in the last 72 hours.   Obesity Estimated body mass index is 36.46 kg/m as calculated from the following:   Height as of this encounter: 5\' 9"  (1.753 m).   Weight as of this encounter: 112 kg.    Procedures: None Consults: PCCM DVT  Prophylaxis: Eliquis Code Status:Full code  Family Communication: None at bedside  The patient is critically ill with multiple organ system failure and requires high complexity decision making for assessment and support, frequent evaluation and titration of therapies, advanced monitoring, review of radiographic studies and interpretation of complex data.    Time spent: 45 minutes-Greater than 50% of this time was spent in counseling, explanation of diagnosis, planning of further management, and coordination of care.   Disposition Plan: Status is: Inpatient  Remains inpatient appropriate because: Severe hypercarbia/hypoxia-on BiPAP with A. fib RVR-not yet stable for discharge.  Diet: Diet Order             Diet NPO time specified Except for: Sips  with Meds  Diet effective now                     Antimicrobial agents: Anti-infectives (From admission, onward)    Start     Dose/Rate Route Frequency Ordered Stop   06/18/2021 1000  bictegravir-emtricitabine-tenofovir AF (BIKTARVY) 50-200-25 MG per tablet 1 tablet        1 tablet Oral Daily 06/15/2021 0710     06/11/2021 0800  oseltamivir (TAMIFLU) capsule 75 mg        75 mg Oral 2 times daily 07/04/2021 0710 06/18/21 0959   07/04/2021 0530  oseltamivir (TAMIFLU) capsule 75 mg        75 mg Oral  Once 06/15/2021 0520 06/29/2021 0536        MEDICATIONS: Scheduled Meds:  albuterol  5 mg Nebulization Once   apixaban  5 mg Oral BID   bictegravir-emtricitabine-tenofovir AF  1 tablet Oral Daily   budesonide (PULMICORT) nebulizer solution  0.25 mg Nebulization BID   ipratropium-albuterol  3 mL Nebulization Q6H   losartan  12.5 mg Oral Daily   methylPREDNISolone (SOLU-MEDROL) injection  125 mg Intravenous BID   nicotine  14 mg Transdermal Daily   oseltamivir  75 mg Oral BID   rosuvastatin  20 mg Oral Daily   sodium chloride flush  3 mL Intravenous Q12H   Continuous Infusions:  sodium chloride     diltiazem (CARDIZEM) infusion 10 mg/hr (06/19/2021 0715)   PRN Meds:.sodium chloride, acetaminophen **OR** acetaminophen, albuterol, insulin aspart, ondansetron **OR** ondansetron (ZOFRAN) IV, senna-docusate, sodium chloride flush   I have personally reviewed following labs and imaging studies  LABORATORY DATA: CBC: Recent Labs  Lab 06/25/2021 0412 06/26/2021 0420 07/08/2021 0455  WBC 13.7*  --   --   NEUTROABS 9.8*  --   --   HGB 14.1 16.7 15.6  HCT 45.7 49.0 46.0  MCV 93.8  --   --   PLT 207  --   --     Basic Metabolic Panel: Recent Labs  Lab 06/10/21 1029 06/14/2021 0420 07/02/2021 0455  NA 137 135 134*  K 4.4 4.6 4.3  CL 99 99  --   CO2 32  --   --   GLUCOSE 147* 194*  --   BUN 16 21*  --   CREATININE 1.19 1.20  --   CALCIUM 9.8  --   --     GFR: Estimated Creatinine  Clearance: 85.8 mL/min (by C-G formula based on SCr of 1.2 mg/dL).  Liver Function Tests: Recent Labs  Lab 06/10/21 1029  AST 17  ALT 19  BILITOT 0.3  PROT 7.8   No results for input(s): LIPASE, AMYLASE in the last 168 hours. No results for input(s): AMMONIA in the last 168 hours.  Coagulation Profile: No results for input(s): INR, PROTIME in the last 168 hours.  Cardiac Enzymes: No results for input(s): CKTOTAL, CKMB, CKMBINDEX, TROPONINI in the last 168 hours.  BNP (last 3 results) No results for input(s): PROBNP in the last 8760 hours.  Lipid Profile: Recent Labs    06/10/21 1029  CHOL 231*  HDL 40  LDLCALC 164*  TRIG 137  CHOLHDL 5.8*    Thyroid Function Tests: No results for input(s): TSH, T4TOTAL, FREET4, T3FREE, THYROIDAB in the last 72 hours.  Anemia Panel: No results for input(s): VITAMINB12, FOLATE, FERRITIN, TIBC, IRON, RETICCTPCT in the last 72 hours.  Urine analysis:    Component Value Date/Time   COLORURINE YELLOW 10/29/2020 1800   APPEARANCEUR CLEAR 10/29/2020 1800   LABSPEC 1.014 10/29/2020 1800   PHURINE 5.0 10/29/2020 1800   GLUCOSEU NEGATIVE 10/29/2020 1800   HGBUR SMALL (A) 10/29/2020 1800   BILIRUBINUR NEGATIVE 10/29/2020 1800   BILIRUBINUR negative 06/04/2020 1135   BILIRUBINUR neg 11/12/2016 0946   KETONESUR NEGATIVE 10/29/2020 1800   PROTEINUR 100 (A) 10/29/2020 1800   UROBILINOGEN 0.2 06/04/2020 1135   UROBILINOGEN 1 09/21/2014 1205   NITRITE NEGATIVE 10/29/2020 1800   LEUKOCYTESUR NEGATIVE 10/29/2020 1800    Sepsis Labs: Lactic Acid, Venous    Component Value Date/Time   LATICACIDVEN 1.90 01/21/2018 1249    MICROBIOLOGY: Recent Results (from the past 240 hour(s))  Resp Panel by RT-PCR (Flu A&B, Covid) Nasopharyngeal Swab     Status: Abnormal   Collection Time: 07/04/2021  4:06 AM   Specimen: Nasopharyngeal Swab; Nasopharyngeal(NP) swabs in vial transport medium  Result Value Ref Range Status   SARS Coronavirus 2 by RT  PCR NEGATIVE NEGATIVE Final    Comment: (NOTE) SARS-CoV-2 target nucleic acids are NOT DETECTED.  The SARS-CoV-2 RNA is generally detectable in upper respiratory specimens during the acute phase of infection. The lowest concentration of SARS-CoV-2 viral copies this assay can detect is 138 copies/mL. A negative result does not preclude SARS-Cov-2 infection and should not be used as the sole basis for treatment or other patient management decisions. A negative result may occur with  improper specimen collection/handling, submission of specimen other than nasopharyngeal swab, presence of viral mutation(s) within the areas targeted by this assay, and inadequate number of viral copies(<138 copies/mL). A negative result must be combined with clinical observations, patient history, and epidemiological information. The expected result is Negative.  Fact Sheet for Patients:  BloggerCourse.com  Fact Sheet for Healthcare Providers:  SeriousBroker.it  This test is no t yet approved or cleared by the [place] FDA and  has been authorized for detection and/or diagnosis of SARS-CoV-2 by FDA under an Emergency Use Authorization (EUA). This EUA will remain  in effect (meaning this test can be used) for the duration of the COVID-19 declaration under Section 564(b)(1) of the Act, 21 U.S.C.section 360bbb-3(b)(1), unless the authorization is terminated  or revoked sooner.       Influenza A by PCR POSITIVE (A) NEGATIVE Final   Influenza B by PCR NEGATIVE NEGATIVE Final    Comment: (NOTE) The Xpert Xpress SARS-CoV-2/FLU/RSV plus assay is intended as an aid in the diagnosis of influenza from Nasopharyngeal swab specimens and should not be used as a sole basis for treatment. Nasal washings and aspirates are unacceptable for Xpert Xpress SARS-CoV-2/FLU/RSV testing.  Fact Sheet for Patients: BloggerCourse.com  Fact Sheet  for Healthcare Providers: SeriousBroker.it  This test is not yet approved or cleared by the [place] FDA and has  been authorized for detection and/or diagnosis of SARS-CoV-2 by FDA under an Emergency Use Authorization (EUA). This EUA will remain in effect (meaning this test can be used) for the duration of the COVID-19 declaration under Section 564(b)(1) of the Act, 21 U.S.C. section 360bbb-3(b)(1), unless the authorization is terminated or revoked.  Performed at [facility], [address] [place], [place] [address]     RADIOLOGY STUDIES/RESULTS: DG Chest Portable 1 View  Result Date: 07/06/2021 CLINICAL DATA:  55 year old male with atrial fibrillation. EXAM: PORTABLE CHEST 1 VIEW COMPARISON:  Chest radiographs 05/17/2021 and earlier. FINDINGS: Portable AP semi upright view at 0419 hours. Mild to moderate cardiomegaly appears stable allowing for differences in technique. Other mediastinal contours are within normal limits. Visualized tracheal air column is within normal limits. Asymmetric increased markings at the right lung base are stable. Elsewhere when allowing for portable technique the lungs are clear. No pneumothorax or pleural effusion. No acute osseous abnormality identified. IMPRESSION: No acute cardiopulmonary abnormality. Electronically Signed   By: Odessa Fleming M.D.   On: 07/04/2021 04:55     LOS: 0 days   Jeoffrey Massed, MD  [facility]    To contact the attending provider between 7A-7P or the covering provider during after hours 7P-7A, please log into the web site [email or web address] and access using universal [place] password for that web site. If you do not have the password, please call the hospital operator.  06/11/2021, 8:54 AM

## 2021-06-13 NOTE — Progress Notes (Signed)
Notified nurse to put on iWatch to IV site. Vasopressor to run to this line for only 24hr. Nurse notified and placing iWatch to IV site at this time. Tomasita Morrow, RN VAST

## 2021-06-13 NOTE — Progress Notes (Signed)
54 year old male with HIV who was admitted with acute on chronic hypoxic/hypercapnic respiratory failure in the setting of influenza pneumonia, later in the afternoon he was noted to be lethargic, unable to wake up while on BiPAP, with increased oral secretions high risk of aspiration.  Decision was made to proceed with endotracheal intubation and placed him on mechanical ventilation.  Around 6 PM: ABGs were drawn, showed acute respiratory acidosis with pH of 7.19 with PCO2 in the 90s, respiratory rate was increased from 18-30 and PEEP was increased from 5-8  Around 6:30 PM patient was noted to be hypotensive with SBP in 80s and maps 50 50s, he was started on IV Levophed, on ventilator monitor it shows auto peeping with baseline PEEP of 8, his actual PEEP was 17, I time was decreased 2.8 and respiratory rate it was decreased to 28 with slight improvement in auto PEEP.  Patient blood pressure was 26  Will obtain ABG in an hour, his oxygen saturation and blood pressure have improved.   Additional critical care time: 31 minutes  Performed by: Cheri Fowler   Critical care time was exclusive of separately billable procedures and treating other patients.   Critical care was necessary to treat or prevent imminent or life-threatening deterioration.   Critical care was time spent personally by me on the following activities: development of treatment plan with patient and/or surrogate as well as nursing, discussions with consultants, evaluation of patient's response to treatment, examination of patient, obtaining history from patient or surrogate, ordering and performing treatments and interventions, ordering and review of laboratory studies, ordering and review of radiographic studies, pulse oximetry and re-evaluation of patient's condition.   Cheri Fowler MD [facility] See Amion for pager If no response to pager, please call [phone] until 7pm After 7pm, Please call E-link  [phone]

## 2021-06-14 ENCOUNTER — Inpatient Hospital Stay (HOSPITAL_COMMUNITY): Payer: Medicare HMO

## 2021-06-14 DIAGNOSIS — J449 Chronic obstructive pulmonary disease, unspecified: Secondary | ICD-10-CM | POA: Diagnosis not present

## 2021-06-14 DIAGNOSIS — J9621 Acute and chronic respiratory failure with hypoxia: Secondary | ICD-10-CM | POA: Diagnosis not present

## 2021-06-14 DIAGNOSIS — J441 Chronic obstructive pulmonary disease with (acute) exacerbation: Secondary | ICD-10-CM

## 2021-06-14 DIAGNOSIS — I4891 Unspecified atrial fibrillation: Secondary | ICD-10-CM

## 2021-06-14 DIAGNOSIS — Z794 Long term (current) use of insulin: Secondary | ICD-10-CM

## 2021-06-14 DIAGNOSIS — E119 Type 2 diabetes mellitus without complications: Secondary | ICD-10-CM

## 2021-06-14 LAB — COMPREHENSIVE METABOLIC PANEL
ALT: 22 U/L (ref 0–44)
AST: 23 U/L (ref 15–41)
Albumin: 3.2 g/dL — ABNORMAL LOW (ref 3.5–5.0)
Alkaline Phosphatase: 45 U/L (ref 38–126)
Anion gap: 8 (ref 5–15)
BUN: 39 mg/dL — ABNORMAL HIGH (ref 6–20)
CO2: 32 mmol/L (ref 22–32)
Calcium: 8.8 mg/dL — ABNORMAL LOW (ref 8.9–10.3)
Chloride: 95 mmol/L — ABNORMAL LOW (ref 98–111)
Creatinine, Ser: 1.95 mg/dL — ABNORMAL HIGH (ref 0.61–1.24)
GFR, Estimated: 40 mL/min — ABNORMAL LOW (ref >60)
Glucose, Bld: 195 mg/dL — ABNORMAL HIGH (ref 70–99)
Potassium: 5.2 mmol/L — ABNORMAL HIGH (ref 3.5–5.1)
Sodium: 135 mmol/L (ref 135–145)
Total Bilirubin: 0.6 mg/dL (ref 0.3–1.2)
Total Protein: 7.7 g/dL (ref 6.5–8.1)

## 2021-06-14 LAB — CBC
HCT: 41.7 % (ref 39.0–52.0)
Hemoglobin: 12.7 g/dL — ABNORMAL LOW (ref 13.0–17.0)
MCH: 29.1 pg (ref 26.0–34.0)
MCHC: 30.5 g/dL (ref 30.0–36.0)
MCV: 95.6 fL (ref 80.0–100.0)
Platelets: 186 10*3/uL (ref 150–400)
RBC: 4.36 MIL/uL (ref 4.22–5.81)
RDW: 12 % (ref 11.5–15.5)
WBC: 13 10*3/uL — ABNORMAL HIGH (ref 4.0–10.5)
nRBC: 0 % (ref 0.0–0.2)

## 2021-06-14 LAB — ECHOCARDIOGRAM COMPLETE
Area-P 1/2: 4.26 cm2
Height: 69 in
S' Lateral: 3.8 cm
Weight: 3809.55 oz

## 2021-06-14 LAB — GLUCOSE, CAPILLARY
Glucose-Capillary: 193 mg/dL — ABNORMAL HIGH (ref 70–99)
Glucose-Capillary: 158 mg/dL — ABNORMAL HIGH (ref 70–99)
Glucose-Capillary: 155 mg/dL — ABNORMAL HIGH (ref 70–99)
Glucose-Capillary: 147 mg/dL — ABNORMAL HIGH (ref 70–99)
Glucose-Capillary: 216 mg/dL — ABNORMAL HIGH (ref 70–99)
Glucose-Capillary: 284 mg/dL — ABNORMAL HIGH (ref 70–99)

## 2021-06-14 LAB — BASIC METABOLIC PANEL
Anion gap: 6 (ref 5–15)
BUN: 44 mg/dL — ABNORMAL HIGH (ref 6–20)
CO2: 32 mmol/L (ref 22–32)
Calcium: 8.6 mg/dL — ABNORMAL LOW (ref 8.9–10.3)
Chloride: 97 mmol/L — ABNORMAL LOW (ref 98–111)
Creatinine, Ser: 1.8 mg/dL — ABNORMAL HIGH (ref 0.61–1.24)
GFR, Estimated: 44 mL/min — ABNORMAL LOW (ref >60)
Glucose, Bld: 178 mg/dL — ABNORMAL HIGH (ref 70–99)
Potassium: 5 mmol/L (ref 3.5–5.1)
Sodium: 135 mmol/L (ref 135–145)

## 2021-06-14 LAB — MRSA NEXT GEN BY PCR, NASAL: MRSA by PCR Next Gen: NOT DETECTED

## 2021-06-14 MED ORDER — KETAMINE HCL 50 MG/5ML IJ SOSY
PREFILLED_SYRINGE | INTRAMUSCULAR | Status: AC
  Filled 2021-06-14: qty 5

## 2021-06-14 MED ORDER — ROCURONIUM BROMIDE 10 MG/ML (PF) SYRINGE
PREFILLED_SYRINGE | INTRAVENOUS | Status: AC
  Filled 2021-06-14: qty 10

## 2021-06-14 MED ORDER — MIDAZOLAM HCL 2 MG/2ML IJ SOLN
INTRAMUSCULAR | Status: AC
  Filled 2021-06-14: qty 2

## 2021-06-14 MED ORDER — PROSOURCE TF PO LIQD
45.0000 mL | Freq: Every day | ORAL | Status: DC
  Administered 2021-06-14 – 2021-06-18 (×4): 45 mL
  Filled 2021-06-14 (×4): qty 45

## 2021-06-14 MED ORDER — INSULIN GLARGINE-YFGN 100 UNIT/ML ~~LOC~~ SOLN
10.0000 [IU] | Freq: Every day | SUBCUTANEOUS | Status: DC
  Administered 2021-06-14: 10 [IU] via SUBCUTANEOUS
  Filled 2021-06-14 (×2): qty 0.1

## 2021-06-14 MED ORDER — ETOMIDATE 2 MG/ML IV SOLN
INTRAVENOUS | Status: AC
  Filled 2021-06-14: qty 20

## 2021-06-14 MED ORDER — VITAL HIGH PROTEIN PO LIQD
1000.0000 mL | ORAL | Status: DC
  Administered 2021-06-15: 1000 mL

## 2021-06-14 MED ORDER — GUAIFENESIN 100 MG/5ML PO LIQD
10.0000 mL | Freq: Four times a day (QID) | ORAL | Status: AC
  Administered 2021-06-14 – 2021-06-16 (×7): 10 mL
  Filled 2021-06-14 (×7): qty 10

## 2021-06-14 MED ORDER — METHYLPREDNISOLONE SODIUM SUCC 125 MG IJ SOLR
40.0000 mg | Freq: Two times a day (BID) | INTRAMUSCULAR | Status: DC
Start: 2021-06-14 — End: 2021-06-15
  Administered 2021-06-14 (×2): 40 mg via INTRAVENOUS
  Filled 2021-06-14: qty 2

## 2021-06-14 MED ORDER — EMTRICITABINE-TENOFOVIR AF 200-25 MG PO TABS
1.0000 | ORAL_TABLET | Freq: Every day | ORAL | Status: DC
  Administered 2021-06-14 – 2021-07-05 (×21): 1
  Filled 2021-06-14 (×22): qty 1

## 2021-06-14 MED ORDER — MIDAZOLAM HCL 2 MG/2ML IJ SOLN
2.0000 mg | INTRAMUSCULAR | Status: DC | PRN
  Administered 2021-06-14 (×4): 2 mg via INTRAVENOUS
  Filled 2021-06-14 (×5): qty 2

## 2021-06-14 MED ORDER — PERFLUTREN LIPID MICROSPHERE
1.0000 mL | INTRAVENOUS | Status: AC | PRN
Start: 2021-06-14 — End: 2021-06-14
  Administered 2021-06-14: 2 mL via INTRAVENOUS
  Filled 2021-06-14: qty 10

## 2021-06-14 MED ORDER — FENTANYL CITRATE (PF) 100 MCG/2ML IJ SOLN
INTRAMUSCULAR | Status: AC
  Filled 2021-06-14: qty 2

## 2021-06-14 MED ORDER — SODIUM ZIRCONIUM CYCLOSILICATE 10 G PO PACK
10.0000 g | PACK | Freq: Once | ORAL | Status: AC
  Administered 2021-06-14: 10 g
  Filled 2021-06-14: qty 1

## 2021-06-14 MED ORDER — DILTIAZEM HCL 30 MG PO TABS
60.0000 mg | ORAL_TABLET | Freq: Four times a day (QID) | ORAL | Status: DC
  Administered 2021-06-14 – 2021-06-16 (×7): 60 mg
  Filled 2021-06-14 (×7): qty 2

## 2021-06-14 MED ORDER — VITAL HIGH PROTEIN PO LIQD
1000.0000 mL | Freq: Once | ORAL | Status: AC
  Administered 2021-06-14: 1000 mL

## 2021-06-14 MED ORDER — VITAL HIGH PROTEIN PO LIQD: 1000.0000 mL | ORAL | Status: DC

## 2021-06-14 MED ORDER — METHYLPREDNISOLONE SODIUM SUCC 125 MG IJ SOLR: 40.0000 mg | Freq: Two times a day (BID) | INTRAMUSCULAR | Status: DC

## 2021-06-14 MED ORDER — DOLUTEGRAVIR SODIUM 50 MG PO TABS
50.0000 mg | ORAL_TABLET | Freq: Every day | ORAL | Status: DC
  Administered 2021-06-14 – 2021-07-05 (×21): 50 mg
  Filled 2021-06-14 (×23): qty 1

## 2021-06-14 MED ORDER — SUCCINYLCHOLINE CHLORIDE 200 MG/10ML IV SOSY
PREFILLED_SYRINGE | INTRAVENOUS | Status: AC
  Filled 2021-06-14: qty 10

## 2021-06-14 NOTE — Progress Notes (Signed)
RT called for pt self extubated. Pt on NRB upon arrival SPO2 84-87%. Pt placed on NIV/PC 8/8 80% BUR 10. Pt alert and speaking. SPO2 94% RR 20-24.

## 2021-06-14 NOTE — Progress Notes (Addendum)
eLink Physician-Brief Progress Note Patient Name: Chris [name]. DOB: May 25, 1966 MRN: [id number]   Date of Service  06/14/2021  HPI/Events of Note  Patient self-extubated. Sat = 86% on NRB mask. Now on BiPAP with Sat = 91-92% and RR = 21.   eICU Interventions  Plan: Continue on BiPAP as tolerated. Precedex order modified to 0.2 to 0.6 mcg/kg/hour. Titrate to RASS = 0. PCCM ground team asked to evaluate the patient at bedside.      Intervention Category Major Interventions: Respiratory failure - evaluation and management  Alline Pio Eugene 06/14/2021, 10:40 PM

## 2021-06-14 NOTE — Progress Notes (Signed)
eLink Physician-Brief Progress Note Patient Name: Chris [name]. DOB: [birth year] MRN: [id number]   Date of Service  06/14/2021  HPI/Events of Note  Hyperkalemia - K+ = 7.2 -->5.2.  eICU Interventions  Plan: Lokelma 10 gm per tube now. Repeat BMP at 10 AM.     Intervention Category Major Interventions: Electrolyte abnormality - evaluation and management  Willette Mudry Eugene 06/14/2021, 4:16 AM

## 2021-06-14 NOTE — Progress Notes (Signed)
NAME:  Chris Hutzell., MRN:  [id number], DOB:  [birth year], LOS: 1 ADMISSION DATE:  06/18/2021, CONSULTATION DATE:  06/27/2021 REFERRING MD:  Tera Helper, CHIEF COMPLAINT:  respiratory distress   History of Present Illness:  [name] a 55 year old male with PMHx of chronic hypoxic respiratory failure 2/2 COPD on 2L oxygen at baseline, Afib on Eliquis, HFpEF, hypertension, type 2 diabetes mellitus, ARDS 2/2 influenza A requiring intubation and subsequent tracheostomy presenting with three days of fever, cough, and dsypnea in setting of sick contact. He notes that his grandson has been sick. He had his COVID vaccine booster earlier this week. He reports shortness of breath is worse with activity and lying flat. He also endorses decreased oral intake. Does endorse palpitations but denies any chest pain. Also has had nausea and vomiting at home. Patient initially  noted to be in Afib with RVR on presentation to the ED for which he was started on cardizem infusion and placed on BiPAP for respiratory distress and hypoxia. He was noted to be influenza positive. Patient admitted to hospitalist service. Patient with worsening respiratory status and concern for increased oral secretions for which PCCM consulted.   Pertinent  Medical History   Past Medical History:  Diagnosis Date   Anxiety    Atrial fibrillation (HCC)    CHF (congestive heart failure) (HCC)    COPD (chronic obstructive pulmonary disease) (HCC)    Emphysema [[name]]   Depression    Diabetes mellitus without complication ([place])    type 2   Emphysema of lung ([place])    GERD (gastroesophageal reflux disease)    HIV disease ([place])    Hypertension    Oxygen deficiency      Significant Hospital Events: Including procedures, antibiotic start and stop dates in addition to other pertinent events   11/3> admitted to hospitalist service with influenza A; transferred to ICU for acute on chronic hypoxic respiratory failure;  intubated   Interim History / Subjective:  Overnight, with acute respiratory acidosis and progressive hypotension with maps in the 50s for which he was started on Levophed.  Noted to be auto peeping on vent for which respiratory rate was adjusted with improvement.  Patient also noted to be frequently made for which given sodium bicarb, calcium gluconate, insulin and Lokelma. This morning, patient is continued on vent with improved oxygenation.  Currently off Levophed.  Objective   Blood pressure (!) 103/58, pulse 98, temperature 98.4 F (36.9 C), temperature source Axillary, resp. rate (!) 24, height 5\' 9"  (1.753 m), weight 108 kg, SpO2 90 %.    Vent Mode: PRVC FiO2 (%):  [40 %-100 %] 70 % Set Rate:  [18 bmp-30 bmp] 24 bmp Vt Set:  [480 mL-560 mL] 480 mL PEEP:  [8 cmH20] 8 cmH20 Plateau Pressure:  [16 cmH20-26 cmH20] 21 cmH20   Intake/Output Summary (Last 24 hours) at 06/14/2021 0719 Last data filed at 06/14/2021 0600 Gross per 24 hour  Intake 811.12 ml  Output 1225 ml  Net -413.88 ml   Filed Weights   07/04/2021 0402 06/14/21 0340  Weight: 112 kg 108 kg    Examination: General: critically ill appearing middle aged male; orally intubated HENT: [place]/AT, anicteric sclerae thick oral secretions noted Lungs: coarse breath sounds; on full vent support Cardiovascular: irregularly irregular, no murmurs or gallops Abdomen: obese, soft, +BS Extremities: warm and dry  Neuro: sedated, awakens to voice and appropriately interactive Skin: No rash  Resolved Hospital Problem list  Assessment & Plan:  Acute on chronic hypoxic and hypercarbic respiratory failure 2/2 Influenza A pneumonia COPD exacerbation Patient remains vent dependent at this time in setting of acute on chronic respiratory failure.  Overnight, evidence of auto peeping for which ventilator settings adjusted.  This appears to be slightly improved.  Continues to have thick oral secretions. -Continue full ventilator support at  this time, hold off SBT for now until improvement and ventilatory oxygen requirements -Continue tamiflu 30 mg twice daily -IV Solu-Medrol 40 mg twice daily -Continue scheduled DuoNebs every 6 hours + Pulmicort twice daily+ albuterol every 4 hours as needed -Guaifenesin 10 mL per tube every 6 hours  Acute metabolic toxic encephalopathy Likely in setting of acute restaurant failure as above.  Patient is currently on Precedex with as needed fentanyl and versed.  -RASS goal 0 to -1 -Continue Precedex gtt -Continue fentanyl and Versed as needed to maintain RASS -If requiring multiple as needed doses, will favor starting fentanyl gtt  Atrial fibrillation with RVR-improved CHADS2VASc score 3 (HTN, CHF, diabetes). Patient is on diltiazem 240mg  daily and Eliquis 5mg  daily at home. Noted to be in Afib with RVR on presentation for which started on cardizem gtt. Remains rate controlled -Transition to oral diltiazem 60 mg every 6 hours -Continue Eliquis 5 mg twice daily -Continue cardiac monitor  AKI on CKDII Hyperkalemia Baseline sCr ~1.2; up to 2.12 with hyperkalemia to 7.2 for which patient received sodium bicarb, calcium gluconate, insulin and lokelma overnight with improvement to 5.2 this AM. sCr slightly improved to 1.2. Continues to have good urine production.  -Trend renal function -Avoid nephrotoxic agents   HFpEF S/p IV lasix 80mg  total yesterday; 1.2L UOP over past 24 hrs. appears euvolemic on exam -Continue to monitor volume status  Type II DM CBG's above goal on current SSI resistant scale.  - Add Semglee 10U daily - Continue SSI resistant q4h  - Continue CBG monitoring q4h  HIV, well controlled -On Biktarvy daily at home. -Currently on Descovy due to renal function  Hypertension Hyperlipidemia Holding home antihypertensive in setting of intermittent hypotension requiring vasopressor support  - Continue rosuvastatin 20mg  daily  Best Practice (right click and "Reselect all  SmartList Selections" daily)   Diet/type: tubefeeds DVT prophylaxis: DOAC GI prophylaxis: PPI Lines: N/A Foley:  Yes, and it is still needed Code Status:  full code Last date of multidisciplinary goals of care discussion [11/3- wife updated at bedside]  Labs   CBC: Recent Labs  Lab 06/19/2021 0412 07/08/2021 0420 06/29/2021 0912 06/29/2021 1742 07/04/2021 2043 06/30/2021 2254 06/14/21 0314  WBC 13.7*  --   --   --   --   --  13.0*  NEUTROABS 9.8*  --   --   --   --   --   --   HGB 14.1   < > 15.6 16.0 15.0 14.6 12.7*  HCT 45.7   < > 46.0 47.0 44.0 43.0 41.7  MCV 93.8  --   --   --   --   --  95.6  PLT 207  --   --   --   --   --  186   < > = values in this interval not displayed.    Basic Metabolic Panel: Recent Labs  Lab 06/10/21 1029 07/09/2021 0420 06/29/2021 0455 06/23/2021 1742 07/03/2021 2042 06/11/2021 2043 06/21/2021 2254 06/14/21 0314  NA 137 135   < > 131* 129* 130* 134* 135  K 4.4 4.6   < >  6.4* 7.2* 7.0* 4.8 5.2*  CL 99 99  --   --  93*  --   --  95*  CO2 32  --   --   --  30  --   --  32  GLUCOSE 147* 194*  --   --  345*  --   --  195*  BUN 16 21*  --   --  35*  --   --  39*  CREATININE 1.19 1.20  --   --  2.12*  --   --  1.95*  CALCIUM 9.8  --   --   --  8.2*  --   --  8.8*   < > = values in this interval not displayed.   GFR: Estimated Creatinine Clearance: 51.8 mL/min (A) (by C-G formula based on SCr of 1.95 mg/dL (H)). Recent Labs  Lab 07/06/2021 0412 06/14/21 0314  WBC 13.7* 13.0*    Liver Function Tests: Recent Labs  Lab 06/10/21 1029 06/14/21 0314  AST 17 23  ALT 19 22  ALKPHOS  --  45  BILITOT 0.3 0.6  PROT 7.8 7.7  ALBUMIN  --  3.2*   No results for input(s): LIPASE, AMYLASE in the last 168 hours. No results for input(s): AMMONIA in the last 168 hours.  ABG    Component Value Date/Time   PHART 7.246 (L) 07/04/2021 2254   PCO2ART 83.6 (HH) 06/12/2021 2254   PO2ART 32 (LL) 07/09/2021 2254   HCO3 36.3 (H) 07/08/2021 2254   TCO2 39 (H)  07/06/2021 2254   ACIDBASEDEF 3.2 (H) 12/14/2017 2135   O2SAT 48.0 07/06/2021 2254     Coagulation Profile: No results for input(s): INR, PROTIME in the last 168 hours.  Cardiac Enzymes: No results for input(s): CKTOTAL, CKMB, CKMBINDEX, TROPONINI in the last 168 hours.  HbA1C: HbA1c, POC (controlled diabetic range)  Date/Time Value Ref Range Status  04/05/2021 02:09 PM 7.8 (A) 0.0 - 7.0 % Final  04/20/2020 11:13 AM 8.2 (A) 0.0 - 7.0 % Final   Hgb A1c MFr Bld  Date/Time Value Ref Range Status  06/22/2021 04:12 AM 6.8 (H) 4.8 - 5.6 % Final    Comment:    (NOTE) Pre diabetes:          5.7%-6.4%  Diabetes:              >6.4%  Glycemic control for   <7.0% adults with diabetes   10/30/2020 02:25 AM 7.3 (H) 4.8 - 5.6 % Final    Comment:    (NOTE) Pre diabetes:          5.7%-6.4%  Diabetes:              >6.4%  Glycemic control for   <7.0% adults with diabetes     CBG: Recent Labs  Lab 06/22/2021 1521 06/28/2021 1932 06/12/2021 2139 07/06/2021 2336 06/14/21 0327  GLUCAP 242* 297* 305* 343* 193*    Critical care time: 35 minutes    Eliezer Bottom, MD Internal Medicine, PGY-3 06/14/21 7:43 AM Pager # [phone]

## 2021-06-14 NOTE — Progress Notes (Addendum)
Initial Nutrition Assessment  DOCUMENTATION CODES:   Obesity unspecified  INTERVENTION:   Initiate tube feeding via OG tube: Vital High Protein at 75 ml/h x 21 hours per day (1575 ml total; holding 2 hrs before and 1 hour after Tivicay dose) Prosource TF 45 ml once daily  Provides 1615 kcal, 149 gm protein, 1317 ml free water daily  NUTRITION DIAGNOSIS:   Inadequate oral intake related to inability to eat as evidenced by NPO status.  GOAL:   Provide needs based on ASPEN/SCCM guidelines  MONITOR:   Vent status, TF tolerance, Labs  REASON FOR ASSESSMENT:   Ventilator, Consult Enteral/tube feeding initiation and management  ASSESSMENT:   55 yo male admitted with respiratory distress r/t influenza A. Required intubation on 11/3. PMH includes COPD, on 2 L oxygen at baseline, A fib, HF, HTN, DM-2, HIV.  Discussed patient in ICU rounds and with RN today. Received MD Consult for TF initiation and management. OG tube in place.  Per H&P, patient has had decreased oral intake and N/V at home PTA.   Patient is currently intubated on ventilator support MV: 12.5 L/min Temp (24hrs), Avg:97.8 F (36.6 C), Min:97.3 F (36.3 C), Max:98.4 F (36.9 C)   Labs reviewed.  CBG: [phone]  Medications reviewed and include Novolog, Semglee, solumedrol, protonix, precedex.  Spoke with pharmacy regarding holding TF for 2 hours before and 1 hour after Tivicay dose at 10 am.  Weight history reviewed. 4% weight loss in the past week. Weight loss likely multifactorial: fluid shifts associated with CHF, recent poor intake, N/V.   NUTRITION - FOCUSED PHYSICAL EXAM:  Flowsheet Row Most Recent Value  Orbital Region No depletion  Upper Arm Region No depletion  Thoracic and Lumbar Region No depletion  Buccal Region No depletion  Temple Region No depletion  Clavicle Bone Region No depletion  Clavicle and Acromion Bone Region No depletion  Scapular Bone Region Unable to assess  Dorsal  Hand No depletion  Patellar Region No depletion  Anterior Thigh Region No depletion  Posterior Calf Region Mild depletion  Edema (RD Assessment) Moderate  Hair Reviewed  Eyes Unable to assess  Mouth Unable to assess  Skin Reviewed  Nails Reviewed       Diet Order:   Diet Order             Diet NPO time specified  Diet effective now                   EDUCATION NEEDS:   Not appropriate for education at this time  Skin:  Skin Assessment: Reviewed RN Assessment  Last BM:  11/2  Height:   Ht Readings from Last 1 Encounters:  Jul 09, 2021 5\' 9"  (1.753 m)    Weight:   Wt Readings from Last 1 Encounters:  06/14/21 108 kg    BMI:  Body mass index is 35.16 kg/m.  Estimated Nutritional Needs:   Kcal:  1550  Protein:  145-155 gm  Fluid:  >/= 2 L    13/04/22, RD, LDN, CNSC Please refer to Amion for contact information.

## 2021-06-15 ENCOUNTER — Inpatient Hospital Stay (HOSPITAL_COMMUNITY): Payer: Medicare HMO

## 2021-06-15 ENCOUNTER — Other Ambulatory Visit: Payer: Self-pay | Admitting: Nurse Practitioner

## 2021-06-15 DIAGNOSIS — J9621 Acute and chronic respiratory failure with hypoxia: Secondary | ICD-10-CM | POA: Diagnosis not present

## 2021-06-15 DIAGNOSIS — E1165 Type 2 diabetes mellitus with hyperglycemia: Secondary | ICD-10-CM

## 2021-06-15 DIAGNOSIS — J449 Chronic obstructive pulmonary disease, unspecified: Secondary | ICD-10-CM | POA: Diagnosis not present

## 2021-06-15 DIAGNOSIS — I4891 Unspecified atrial fibrillation: Secondary | ICD-10-CM | POA: Diagnosis not present

## 2021-06-15 DIAGNOSIS — J441 Chronic obstructive pulmonary disease with (acute) exacerbation: Secondary | ICD-10-CM | POA: Diagnosis not present

## 2021-06-15 DIAGNOSIS — J9622 Acute and chronic respiratory failure with hypercapnia: Secondary | ICD-10-CM | POA: Diagnosis not present

## 2021-06-15 DIAGNOSIS — J101 Influenza due to other identified influenza virus with other respiratory manifestations: Secondary | ICD-10-CM | POA: Diagnosis not present

## 2021-06-15 LAB — GLUCOSE, CAPILLARY
Glucose-Capillary: 252 mg/dL — ABNORMAL HIGH (ref 70–99)
Glucose-Capillary: 194 mg/dL — ABNORMAL HIGH (ref 70–99)
Glucose-Capillary: 155 mg/dL — ABNORMAL HIGH (ref 70–99)
Glucose-Capillary: 194 mg/dL — ABNORMAL HIGH (ref 70–99)
Glucose-Capillary: 283 mg/dL — ABNORMAL HIGH (ref 70–99)
Glucose-Capillary: 331 mg/dL — ABNORMAL HIGH (ref 70–99)
Glucose-Capillary: 292 mg/dL — ABNORMAL HIGH (ref 70–99)

## 2021-06-15 LAB — BASIC METABOLIC PANEL
Anion gap: 6 (ref 5–15)
Anion gap: 10 (ref 5–15)
BUN: 52 mg/dL — ABNORMAL HIGH (ref 6–20)
BUN: 56 mg/dL — ABNORMAL HIGH (ref 6–20)
CO2: 30 mmol/L (ref 22–32)
CO2: 29 mmol/L (ref 22–32)
Calcium: 8.6 mg/dL — ABNORMAL LOW (ref 8.9–10.3)
Calcium: 8 mg/dL — ABNORMAL LOW (ref 8.9–10.3)
Chloride: 99 mmol/L (ref 98–111)
Chloride: 90 mmol/L — ABNORMAL LOW (ref 98–111)
Creatinine, Ser: 1.33 mg/dL — ABNORMAL HIGH (ref 0.61–1.24)
Creatinine, Ser: 1.61 mg/dL — ABNORMAL HIGH (ref 0.61–1.24)
GFR, Estimated: >60 mL/min (ref >60)
GFR, Estimated: 50 mL/min — ABNORMAL LOW (ref >60)
Glucose, Bld: 236 mg/dL — ABNORMAL HIGH (ref 70–99)
Glucose, Bld: 335 mg/dL — ABNORMAL HIGH (ref 70–99)
Potassium: 6 mmol/L — ABNORMAL HIGH (ref 3.5–5.1)
Potassium: 5.7 mmol/L — ABNORMAL HIGH (ref 3.5–5.1)
Sodium: 135 mmol/L (ref 135–145)
Sodium: 129 mmol/L — ABNORMAL LOW (ref 135–145)

## 2021-06-15 LAB — POCT I-STAT 7, (LYTES, BLD GAS, ICA,H+H)
Acid-Base Excess: 8 mmol/L — ABNORMAL HIGH (ref 0.0–2.0)
Acid-Base Excess: 9 mmol/L — ABNORMAL HIGH (ref 0.0–2.0)
Bicarbonate: 35.9 mmol/L — ABNORMAL HIGH (ref 20.0–28.0)
Bicarbonate: 38.5 mmol/L — ABNORMAL HIGH (ref 20.0–28.0)
Calcium, Ion: 1.18 mmol/L (ref 1.15–1.40)
Calcium, Ion: 1.25 mmol/L (ref 1.15–1.40)
HCT: 39 % (ref 39.0–52.0)
HCT: 39 % (ref 39.0–52.0)
Hemoglobin: 13.3 g/dL (ref 13.0–17.0)
Hemoglobin: 13.3 g/dL (ref 13.0–17.0)
O2 Saturation: 63 %
O2 Saturation: 99 %
Potassium: 4.7 mmol/L (ref 3.5–5.1)
Potassium: 4.9 mmol/L (ref 3.5–5.1)
Sodium: 134 mmol/L — ABNORMAL LOW (ref 135–145)
Sodium: 138 mmol/L (ref 135–145)
TCO2: 38 mmol/L — ABNORMAL HIGH (ref 22–32)
TCO2: 41 mmol/L — ABNORMAL HIGH (ref 22–32)
pCO2 arterial: 66 mmHg (ref 32.0–48.0)
pCO2 arterial: 82.5 mmHg (ref 32.0–48.0)
pH, Arterial: 7.343 — ABNORMAL LOW (ref 7.350–7.450)
pH, Arterial: 7.277 — ABNORMAL LOW (ref 7.350–7.450)
pO2, Arterial: 36 mmHg — CL (ref 83.0–108.0)
pO2, Arterial: 181 mmHg — ABNORMAL HIGH (ref 83.0–108.0)

## 2021-06-15 LAB — BLOOD GAS, VENOUS
Acid-Base Excess: 6.6 mmol/L — ABNORMAL HIGH (ref 0.0–2.0)
Bicarbonate: 33.1 mmol/L — ABNORMAL HIGH (ref 20.0–28.0)
Drawn by: 6348
FIO2: 80
O2 Saturation: 89.1 %
Patient temperature: 36.4
pCO2, Ven: 71.1 mmHg (ref 44.0–60.0)
pH, Ven: 7.286 (ref 7.250–7.430)
pO2, Ven: 61.5 mmHg — ABNORMAL HIGH (ref 32.0–45.0)

## 2021-06-15 MED ORDER — SODIUM ZIRCONIUM CYCLOSILICATE 10 G PO PACK
10.0000 g | PACK | Freq: Once | ORAL | Status: AC
  Administered 2021-06-16: 10 g
  Filled 2021-06-15: qty 1

## 2021-06-15 MED ORDER — FENTANYL CITRATE (PF) 100 MCG/2ML IJ SOLN
INTRAMUSCULAR | Status: AC
  Administered 2021-06-15: 100 ug
  Filled 2021-06-15: qty 2

## 2021-06-15 MED ORDER — HALOPERIDOL LACTATE 5 MG/ML IJ SOLN
INTRAMUSCULAR | Status: AC
  Filled 2021-06-15: qty 1

## 2021-06-15 MED ORDER — ETOMIDATE 2 MG/ML IV SOLN
20.0000 mg | Freq: Once | INTRAVENOUS | Status: AC
  Administered 2021-06-15: 20 mg via INTRAVENOUS

## 2021-06-15 MED ORDER — ROCURONIUM BROMIDE 10 MG/ML (PF) SYRINGE
PREFILLED_SYRINGE | INTRAVENOUS | Status: AC
  Filled 2021-06-15: qty 10

## 2021-06-15 MED ORDER — ROCURONIUM BROMIDE 50 MG/5ML IV SOLN
100.0000 mg | Freq: Once | INTRAVENOUS | Status: AC
  Administered 2021-06-15: 100 mg via INTRAVENOUS
  Filled 2021-06-15: qty 10

## 2021-06-15 MED ORDER — SUCCINYLCHOLINE CHLORIDE 200 MG/10ML IV SOSY
PREFILLED_SYRINGE | INTRAVENOUS | Status: AC
  Filled 2021-06-15: qty 10

## 2021-06-15 MED ORDER — FENTANYL 2500MCG IN NS 250ML (10MCG/ML) PREMIX INFUSION
150.0000 ug/h | INTRAVENOUS | Status: DC
  Administered 2021-06-15: 150 ug/h via INTRAVENOUS
  Filled 2021-06-15: qty 250

## 2021-06-15 MED ORDER — METHYLPREDNISOLONE SODIUM SUCC 125 MG IJ SOLR
40.0000 mg | Freq: Every day | INTRAMUSCULAR | Status: DC
Start: 2021-06-15 — End: 2021-06-16
  Administered 2021-06-15: 40 mg via INTRAVENOUS
  Filled 2021-06-15: qty 2

## 2021-06-15 MED ORDER — FENTANYL 2500MCG IN NS 250ML (10MCG/ML) PREMIX INFUSION
50.0000 ug/h | INTRAVENOUS | Status: DC
  Administered 2021-06-15 (×2): 150 ug/h via INTRAVENOUS
  Administered 2021-06-16: 125 ug/h via INTRAVENOUS
  Administered 2021-06-17: 150 ug/h via INTRAVENOUS
  Administered 2021-06-18: 190 ug/h via INTRAVENOUS
  Administered 2021-06-18: 200 ug/h via INTRAVENOUS
  Administered 2021-06-19: 150 ug/h via INTRAVENOUS
  Administered 2021-06-19: 50 ug/h via INTRAVENOUS
  Administered 2021-06-20 – 2021-06-21 (×4): 200 ug/h via INTRAVENOUS
  Administered 2021-06-21: 250 ug/h via INTRAVENOUS
  Administered 2021-06-22: 200 ug/h via INTRAVENOUS
  Administered 2021-06-22: 250 ug/h via INTRAVENOUS
  Administered 2021-06-23 (×3): 300 ug/h via INTRAVENOUS
  Administered 2021-06-23: 400 ug/h via INTRAVENOUS
  Administered 2021-06-24 (×2): 300 ug/h via INTRAVENOUS
  Administered 2021-06-24: 275 ug/h via INTRAVENOUS
  Administered 2021-06-25: 300 ug/h via INTRAVENOUS
  Filled 2021-06-15 (×22): qty 250

## 2021-06-15 MED ORDER — INSULIN GLARGINE-YFGN 100 UNIT/ML ~~LOC~~ SOLN
14.0000 [IU] | Freq: Every day | SUBCUTANEOUS | Status: DC
  Administered 2021-06-15: 14 [IU] via SUBCUTANEOUS
  Filled 2021-06-15 (×2): qty 0.14

## 2021-06-15 MED ORDER — POLYETHYLENE GLYCOL 3350 17 G PO PACK
17.0000 g | PACK | Freq: Every day | ORAL | Status: DC
  Administered 2021-06-16 – 2021-06-17 (×2): 17 g
  Filled 2021-06-15 (×2): qty 1

## 2021-06-15 MED ORDER — HALOPERIDOL LACTATE 5 MG/ML IJ SOLN: 5.0000 mg | Freq: Once | INTRAMUSCULAR | Status: AC

## 2021-06-15 MED ORDER — MIDAZOLAM HCL 2 MG/2ML IJ SOLN
INTRAMUSCULAR | Status: AC
  Filled 2021-06-15: qty 2

## 2021-06-15 MED ORDER — FUROSEMIDE 10 MG/ML IJ SOLN
80.0000 mg | Freq: Once | INTRAMUSCULAR | Status: AC
  Administered 2021-06-15: 80 mg via INTRAVENOUS
  Filled 2021-06-15: qty 8

## 2021-06-15 MED ORDER — ETOMIDATE 2 MG/ML IV SOLN
INTRAVENOUS | Status: AC
  Filled 2021-06-15: qty 20

## 2021-06-15 MED ORDER — KETAMINE HCL 50 MG/5ML IJ SOSY
PREFILLED_SYRINGE | INTRAMUSCULAR | Status: AC
  Filled 2021-06-15: qty 5

## 2021-06-15 MED ORDER — OSELTAMIVIR PHOSPHATE 75 MG PO CAPS
75.0000 mg | ORAL_CAPSULE | Freq: Two times a day (BID) | ORAL | Status: AC
  Administered 2021-06-15 – 2021-06-17 (×5): 75 mg
  Filled 2021-06-15 (×6): qty 1

## 2021-06-15 MED ORDER — HALOPERIDOL LACTATE 5 MG/ML IJ SOLN
5.0000 mg | Freq: Once | INTRAMUSCULAR | Status: AC
  Administered 2021-06-15: 5 mg via INTRAMUSCULAR

## 2021-06-15 MED ORDER — PROPOFOL 1000 MG/100ML IV EMUL
5.0000 ug/kg/min | INTRAVENOUS | Status: DC
  Administered 2021-06-15 (×3): 40 ug/kg/min via INTRAVENOUS
  Administered 2021-06-15 (×2): 41.512 ug/kg/min via INTRAVENOUS
  Administered 2021-06-16 – 2021-06-17 (×10): 40 ug/kg/min via INTRAVENOUS
  Administered 2021-06-17 (×3): 50 ug/kg/min via INTRAVENOUS
  Administered 2021-06-17: 40 ug/kg/min via INTRAVENOUS
  Administered 2021-06-18 (×2): 50 ug/kg/min via INTRAVENOUS
  Filled 2021-06-15 (×2): qty 100
  Filled 2021-06-15: qty 200
  Filled 2021-06-15 (×5): qty 100
  Filled 2021-06-15: qty 200
  Filled 2021-06-15 (×2): qty 100
  Filled 2021-06-15: qty 200
  Filled 2021-06-15 (×5): qty 100

## 2021-06-15 MED ORDER — HALOPERIDOL LACTATE 5 MG/ML IJ SOLN
INTRAMUSCULAR | Status: AC
  Administered 2021-06-15: 5 mg via INTRAMUSCULAR
  Filled 2021-06-15: qty 1

## 2021-06-15 MED ORDER — DOCUSATE SODIUM 50 MG/5ML PO LIQD
100.0000 mg | Freq: Two times a day (BID) | ORAL | Status: DC
  Administered 2021-06-16: 100 mg
  Filled 2021-06-15 (×2): qty 10

## 2021-06-15 MED ORDER — FENTANYL BOLUS VIA INFUSION
50.0000 ug | INTRAVENOUS | Status: DC | PRN
  Administered 2021-06-16: 50 ug via INTRAVENOUS
  Administered 2021-06-17 – 2021-06-19 (×7): 100 ug via INTRAVENOUS
  Administered 2021-06-19: 50 ug via INTRAVENOUS
  Administered 2021-06-19 – 2021-06-20 (×5): 100 ug via INTRAVENOUS
  Administered 2021-06-21: 50 ug via INTRAVENOUS
  Administered 2021-06-21 – 2021-06-23 (×5): 100 ug via INTRAVENOUS
  Administered 2021-06-23 (×2): 50 ug via INTRAVENOUS
  Administered 2021-06-24 (×3): 100 ug via INTRAVENOUS
  Administered 2021-06-24: 75 ug via INTRAVENOUS
  Administered 2021-06-24 – 2021-06-25 (×5): 100 ug via INTRAVENOUS
  Filled 2021-06-15: qty 100

## 2021-06-15 MED ORDER — FENTANYL CITRATE (PF) 100 MCG/2ML IJ SOLN: 50.0000 ug | Freq: Once | INTRAMUSCULAR | Status: DC

## 2021-06-15 NOTE — Progress Notes (Signed)
NAME:  [name]., MRN:  [id number], DOB:  08-12-1965, LOS: 2 ADMISSION DATE:  07/02/2021, CONSULTATION DATE:  06/29/2021 REFERRING MD:  Tera Helper, CHIEF COMPLAINT:  respiratory distress   History of Present Illness:  [name]. Chris [name] a 55 year old male with PMHx of chronic hypoxic respiratory failure 2/2 COPD on 2L oxygen at baseline, Afib on Eliquis, HFpEF, hypertension, type 2 diabetes mellitus, ARDS 2/2 influenza A requiring intubation and subsequent tracheostomy presenting with three days of fever, cough, and dsypnea in setting of sick contact. He notes that his grandson has been sick. He had his COVID vaccine booster earlier this week. He reports shortness of breath is worse with activity and lying flat. He also endorses decreased oral intake. Does endorse palpitations but denies any chest pain. Also has had nausea and vomiting at home. Patient initially  noted to be in Afib with RVR on presentation to the ED for which he was started on cardizem infusion and placed on BiPAP for respiratory distress and hypoxia. He was noted to be influenza positive. Patient admitted to hospitalist service. Patient with worsening respiratory status and concern for increased oral secretions for which PCCM consulted.   Pertinent  Medical History   Past Medical History:  Diagnosis Date   Anxiety    Atrial fibrillation (HCC)    CHF (congestive heart failure) (HCC)    COPD (chronic obstructive pulmonary disease) ([place])    Emphysema [[name]]   Depression    Diabetes mellitus without complication ([place])    type 2   Emphysema of lung ([place])    GERD (gastroesophageal reflux disease)    HIV disease ([place])    Hypertension    Oxygen deficiency      Significant Hospital Events: Including procedures, antibiotic start and stop dates in addition to other pertinent events   11/3> admitted to hospitalist service with influenza A; transferred to ICU for acute on chronic hypoxic respiratory failure;  intubated 11/4 extubated, maintained on bipap overnight, reintubated this morning  Interim History / Subjective:  Self extubated overnight. Placed on bipap and precedex. Worsening agitation and hallucinations with hypoxemia. Reintubated this morning without issue.   Objective   Blood pressure 101/69, pulse 98, temperature 99.1 F (37.3 C), temperature source Oral, resp. rate (!) 26, height 5\' 9"  (1.753 m), weight 108 kg, SpO2 97 %.    Vent Mode: PRVC FiO2 (%):  [70 %-100 %] 90 % Set Rate:  [10 bmp-26 bmp] 26 bmp Vt Set:  [480 mL] 480 mL PEEP:  [5 cmH20-8 cmH20] 8 cmH20 Plateau Pressure:  [18 cmH20-29 cmH20] 20 cmH20   Intake/Output Summary (Last 24 hours) at 06/15/2021 1230 Last data filed at 06/15/2021 1100 Gross per 24 hour  Intake 771.89 ml  Output 2150 ml  Net -1378.11 ml   Filed Weights   06/22/2021 0402 06/14/21 0340  Weight: 112 kg 108 kg    Examination: General: critically ill, intubated, sedated HENT: ETT to vent Lungs:  copious secretions, symmetric bilateral air entry, no wheezing Cardiovascular: irregularly irregular, no murmurs or gallops Abdomen: obese, soft, +BS Extremities: warm and dry Neuro: sedated but responsive. RASS -1 Skin: No rash  Resolved Hospital Problem list    Assessment & Plan:  Acute on chronic hypoxic and hypercarbic respiratory failure 2/2 Influenza A pneumonia COPD exacerbation History of ARDS secondary to Influenza A s/p tracheostomy in 2019 -Continue full ventilator support. Closely monitor I:E ratio and auto-PEEP. He has required Will allow permissive hypercapnia with a  goal pH>7.2. titrate down vent settings as tolerated -Continue tamiflu 30 mg twice daily -IV Solu-Medrol 40 mg - taper to daily -Continue scheduled DuoNebs every 6 hours + Pulmicort twice daily+ albuterol every 4 hours as needed -Guaifenesin 10 mL per tube every 6 hours  Acute metabolic toxic encephalopathy Likely in setting of acute restaurant failure as above.   Patient is currently on Precedex with as needed fentanyl and versed.  -RASS goal 0 to -1 -Continue Precedex gtt, propofol -Continue fentanyl and Versed as needed to maintain RASS -If requiring multiple as needed doses, will favor starting fentanyl gtt  Atrial fibrillation with RVR-improved CHADS2VASc score 3 (HTN, CHF, diabetes). Patient is on diltiazem 240mg  daily and Eliquis 5mg  daily at home. Noted to be in Afib with RVR on presentation for which started on cardizem gtt. Remains rate controlled -continue enteral diltiazem -Continue Eliquis 5 mg twice daily -Continue cardiac monitor  AKI on CKDII Hyperkalemia Baseline sCr ~1.2 curently 1.8 with adequate uop -Trend renal function -Avoid nephrotoxic agents - repeat BMP pending  Decompensated HFpEF Diurese again with IV lasix 80  mg x 1 repeat BMP -Continue to monitor volume status  Type II DM CBG's above goal on current SSI resistant scale.  - increase Semglee to 14U daily - Continue SSI resistant q4h  - Continue CBG monitoring q4h  HIV, well controlled -On Biktarvy daily at home. -Currently on Descovy due to renal function  Hypertension Hyperlipidemia Holding home antihypertensive in setting of intermittent hypotension requiring vasopressor support  - Continue rosuvastatin 20mg  daily  Best Practice (right click and "Reselect all SmartList Selections" daily)   Diet/type: tubefeeds DVT prophylaxis: DOAC GI prophylaxis: PPI Lines: N/A Foley:  Yes, and it is still needed Code Status:  full code Last date of multidisciplinary goals of care discussion [11/3- wife updated at bedside]  Labs   CBC: Recent Labs  Lab 06/22/2021 0412 06/18/2021 0420 06/11/2021 2043 07/03/2021 2254 06/14/21 0314 06/15/21 0513 06/15/21 0842  WBC 13.7*  --   --   --  13.0*  --   --   NEUTROABS 9.8*  --   --   --   --   --   --   HGB 14.1   < > 15.0 14.6 12.7* 13.3 13.3  HCT 45.7   < > 44.0 43.0 41.7 39.0 39.0  MCV 93.8  --   --   --  95.6  --    --   PLT 207  --   --   --  186  --   --    < > = values in this interval not displayed.    Basic Metabolic Panel: Recent Labs  Lab 06/10/21 1029 06/12/2021 0420 07/04/2021 0455 07/06/2021 2042 06/18/2021 2043 06/18/2021 2254 06/14/21 0314 06/14/21 1035 06/15/21 0513 06/15/21 0842  NA 137 135   < > 129*   < > 134* 135 135 134* 138  K 4.4 4.6   < > 7.2*   < > 4.8 5.2* 5.0 4.7 4.9  CL 99 99  --  93*  --   --  95* 97*  --   --   CO2 32  --   --  30  --   --  32 32  --   --   GLUCOSE 147* 194*  --  345*  --   --  195* 178*  --   --   BUN 16 21*  --  35*  --   --  39*  44*  --   --   CREATININE 1.19 1.20  --  2.12*  --   --  1.95* 1.80*  --   --   CALCIUM 9.8  --   --  8.2*  --   --  8.8* 8.6*  --   --    < > = values in this interval not displayed.   GFR: Estimated Creatinine Clearance: 56.1 mL/min (A) (by C-G formula based on SCr of 1.8 mg/dL (H)). Recent Labs  Lab 06/23/2021 0412 06/14/21 0314  WBC 13.7* 13.0*    Liver Function Tests: Recent Labs  Lab 06/10/21 1029 06/14/21 0314  AST 17 23  ALT 19 22  ALKPHOS  --  45  BILITOT 0.3 0.6  PROT 7.8 7.7  ALBUMIN  --  3.2*   No results for input(s): LIPASE, AMYLASE in the last 168 hours. No results for input(s): AMMONIA in the last 168 hours.  ABG    Component Value Date/Time   PHART 7.277 (L) 06/15/2021 0842   PCO2ART 82.5 (HH) 06/15/2021 0842   PO2ART 181 (H) 06/15/2021 0842   HCO3 38.5 (H) 06/15/2021 0842   TCO2 41 (H) 06/15/2021 0842   ACIDBASEDEF 3.2 (H) 12/14/2017 2135   O2SAT 99.0 06/15/2021 0842     Coagulation Profile: No results for input(s): INR, PROTIME in the last 168 hours.  Cardiac Enzymes: No results for input(s): CKTOTAL, CKMB, CKMBINDEX, TROPONINI in the last 168 hours.  HbA1C: HbA1c, POC (controlled diabetic range)  Date/Time Value Ref Range Status  04/05/2021 02:09 PM 7.8 (A) 0.0 - 7.0 % Final  04/20/2020 11:13 AM 8.2 (A) 0.0 - 7.0 % Final   Hgb A1c MFr Bld  Date/Time Value Ref Range Status   07/04/2021 04:12 AM 6.8 (H) 4.8 - 5.6 % Final    Comment:    (NOTE) Pre diabetes:          5.7%-6.4%  Diabetes:              >6.4%  Glycemic control for   <7.0% adults with diabetes   10/30/2020 02:25 AM 7.3 (H) 4.8 - 5.6 % Final    Comment:    (NOTE) Pre diabetes:          5.7%-6.4%  Diabetes:              >6.4%  Glycemic control for   <7.0% adults with diabetes     CBG: Recent Labs  Lab 06/14/21 2316 06/15/21 0400 06/15/21 0656 06/15/21 0749 06/15/21 1112  GLUCAP 284* 252* 194* 155* 194*    Critical care time: 55 minutes     The patient is critically ill due to respiratory failure.  Critical care was necessary to treat or prevent imminent or life-threatening deterioration.  Critical care was time spent personally by me on the following activities: development of treatment plan with patient and/or surrogate as well as nursing, discussions with consultants, evaluation of patient's response to treatment, examination of patient, obtaining history from patient or surrogate, ordering and performing treatments and interventions, ordering and review of laboratory studies, ordering and review of radiographic studies, pulse oximetry, re-evaluation of patient's condition and participation in multidisciplinary rounds.   Critical Care Time devoted to patient care services described in this note is 55 minutes. This time reflects time of care of this signee Charlott Holler . This critical care time does not reflect separately billable procedures or procedure time, teaching time or supervisory time of PA/NP/Med student/Med Resident etc but could involve  care discussion time.       Spero Geralds [place] [facility] 06/15/2021 12:30 PM  Pager: see AMION  If no response to pager , please call critical care on call (see AMION) until 7pm After 7:00 pm call Elink

## 2021-06-15 NOTE — Progress Notes (Signed)
eLink Physician-Brief Progress Note Patient Name: Chris [name]. DOB: 06-Oct-1965 MRN: [id number]   Date of Service  06/15/2021  HPI/Events of Note  VBG on BiPAP = 7.286/71.1/61.5/33.1.  eICU Interventions  Continue present management.     Intervention Category Major Interventions: Other:  Hayzlee Mcsorley Dennard Nip 06/15/2021, 2:13 AM

## 2021-06-15 NOTE — Procedures (Signed)
Intubation Procedure Note  Juniper Snyders  [id number]  Jun 04, 1966  Date:06/15/21  Time:7:51 AM   Provider Performing:Dejanique Ruehl Kathie Rhodes Celine Mans    Procedure: Intubation (31500)  Indication(s) Respiratory Failure. Patient self-extubated overnight and was on bipap with sedation. Worsening hallucination, agitation and desaturation.   Consent Unable to obtain consent due to emergent nature of procedure.   Anesthesia Etomidate and Rocuronium   Time Out Verified patient identification, verified procedure, site/side was marked, verified correct patient position, special equipment/implants available, medications/allergies/relevant history reviewed, required imaging and test results available.   Sterile Technique Usual hand hygeine, masks, and gloves were used   Procedure Description Patient positioned in bed supine.  Sedation given as noted above.  Patient was intubated with endotracheal tube using Glidescope.  View was Grade 1 full glottis .  Number of attempts was 1.  Colorimetric CO2 detector was consistent with tracheal placement.   Complications/Tolerance None; patient tolerated the procedure well. Chest X-ray is ordered to verify placement.   EBL Minimal   Specimen(s) None  Durel Salts, MD Pulmonary and Critical Care Medicine [facility]

## 2021-06-15 NOTE — Progress Notes (Signed)
Present at the bedside with patient, patient said he wanted water will vented. Educated patient about the vent and diet status. Patient became agitated and removed ETT, OG and PIV. MD notified. Blood gas drawn. Wife notified.

## 2021-06-15 NOTE — Progress Notes (Signed)
eLink Physician-Brief Progress Note Patient Name: Chris [name]. DOB: [birth year] MRN: [id number]   Date of Service  06/15/2021  HPI/Events of Note  Patient becoming more agitated and confused. Now hallucinating. Continues to pull off BiPAP.   eICU Interventions  Plan: ABG STAT. Will request PCCM ground team to re-evaluate the patient at bedside.      Intervention Category Major Interventions: Delirium, psychosis, severe agitation - evaluation and management  Dan Scearce Eugene 06/15/2021, 4:47 AM

## 2021-06-15 NOTE — Telephone Encounter (Signed)
Requested Prescriptions  Pending Prescriptions Disp Refills  . VICTOZA 18 MG/3ML SOPN [Pharmacy Med Name: VICTOZA 3-PAK 18 MG/3 ML PEN] 27 mL 0    Sig: INJECT 1.8 MG UNDER THE SKIN ONCE DAILY     Endocrinology:  Diabetes - GLP-1 Receptor Agonists Passed - 06/15/2021  1:05 AM      Passed - HBA1C is between 0 and 7.9 and within 180 days    HbA1c, POC (controlled diabetic range)  Date Value Ref Range Status  04/05/2021 7.8 (A) 0.0 - 7.0 % Final   Hgb A1c MFr Bld  Date Value Ref Range Status  06/26/2021 6.8 (H) 4.8 - 5.6 % Final    Comment:    (NOTE) Pre diabetes:          5.7%-6.4%  Diabetes:              >6.4%  Glycemic control for   <7.0% adults with diabetes          Passed - Valid encounter within last 6 months    Recent Outpatient Visits          2 months ago Type 2 diabetes mellitus with hyperglycemia, without long-term current use of insulin (HCC)   [facility] [place], [place] W, NP   1 year ago Type 2 diabetes mellitus with hyperglycemia, without long-term current use of insulin (HCC)   [facility] [place], [place] W, NP   1 year ago Type 2 diabetes mellitus with hyperglycemia, without long-term current use of insulin (HCC)   [facility] [place], [place], MD   1 year ago Type 2 diabetes mellitus with hyperglycemia, without long-term current use of insulin [facility])   [facility] [place], [place] Stakes, NP   1 year ago Encounter for annual physical exam   [facility] [place], Shea Stakes, NP      Future Appointments            In 3 months [place], Tama Headings, FNP [facility]

## 2021-06-15 NOTE — Progress Notes (Signed)
eLink Physician-Brief Progress Note Patient Name: Chris [name]. DOB: 10/05/1965 MRN: [id number]   Date of Service  06/15/2021  HPI/Events of Note  Nursing requests for: 1. Wean FiO2 and 2. BMP to follow K+.  eICU Interventions  Plan: Wean FiO2. Keep sat >= 92%. BMP STAT.     Intervention Category Major Interventions: Electrolyte abnormality - evaluation and management;Respiratory failure - evaluation and management  Lenell Antu 06/15/2021, 8:23 PM

## 2021-06-15 NOTE — Progress Notes (Signed)
eLink Physician-Brief Progress Note Patient Name: Chris [name]. DOB: 06-Sep-1965 MRN: [id number]   Date of Service  06/15/2021  HPI/Events of Note  Hyperkalemia - K+ = 5.7.  eICU Interventions  Plan: Lokelma 10 gm per tube now. Follow up BMP already ordered for 5 AM.     Intervention Category Major Interventions: Electrolyte abnormality - evaluation and management  Jenissa Tyrell Eugene 06/15/2021, 10:28 PM

## 2021-06-15 NOTE — Progress Notes (Addendum)
STAT ABG collected, sample was venous. Pt requested to be put back on BIPAP. Pt currently tolerating well.

## 2021-06-16 ENCOUNTER — Inpatient Hospital Stay (HOSPITAL_COMMUNITY): Payer: Medicare HMO

## 2021-06-16 DIAGNOSIS — J101 Influenza due to other identified influenza virus with other respiratory manifestations: Secondary | ICD-10-CM | POA: Diagnosis not present

## 2021-06-16 DIAGNOSIS — I4891 Unspecified atrial fibrillation: Secondary | ICD-10-CM | POA: Diagnosis not present

## 2021-06-16 DIAGNOSIS — J9621 Acute and chronic respiratory failure with hypoxia: Secondary | ICD-10-CM | POA: Diagnosis not present

## 2021-06-16 DIAGNOSIS — R739 Hyperglycemia, unspecified: Secondary | ICD-10-CM

## 2021-06-16 DIAGNOSIS — J449 Chronic obstructive pulmonary disease, unspecified: Secondary | ICD-10-CM | POA: Diagnosis not present

## 2021-06-16 LAB — POCT I-STAT 7, (LYTES, BLD GAS, ICA,H+H)
Acid-Base Excess: 10 mmol/L — ABNORMAL HIGH (ref 0.0–2.0)
Bicarbonate: 39.2 mmol/L — ABNORMAL HIGH (ref 20.0–28.0)
Calcium, Ion: 1.27 mmol/L (ref 1.15–1.40)
HCT: 38 % — ABNORMAL LOW (ref 39.0–52.0)
Hemoglobin: 12.9 g/dL — ABNORMAL LOW (ref 13.0–17.0)
O2 Saturation: 76 %
Patient temperature: 99.6
Potassium: 5.3 mmol/L — ABNORMAL HIGH (ref 3.5–5.1)
Sodium: 141 mmol/L (ref 135–145)
TCO2: 41 mmol/L — ABNORMAL HIGH (ref 22–32)
pCO2 arterial: 74.3 mmHg (ref 32.0–48.0)
pH, Arterial: 7.332 — ABNORMAL LOW (ref 7.350–7.450)
pO2, Arterial: 48 mmHg — ABNORMAL LOW (ref 83.0–108.0)

## 2021-06-16 LAB — BASIC METABOLIC PANEL
Anion gap: 5 (ref 5–15)
Anion gap: 7 (ref 5–15)
BUN: 54 mg/dL — ABNORMAL HIGH (ref 6–20)
BUN: 56 mg/dL — ABNORMAL HIGH (ref 6–20)
CO2: 35 mmol/L — ABNORMAL HIGH (ref 22–32)
CO2: 33 mmol/L — ABNORMAL HIGH (ref 22–32)
Calcium: 9 mg/dL (ref 8.9–10.3)
Calcium: 9.1 mg/dL (ref 8.9–10.3)
Chloride: 99 mmol/L (ref 98–111)
Chloride: 100 mmol/L (ref 98–111)
Creatinine, Ser: 1.68 mg/dL — ABNORMAL HIGH (ref 0.61–1.24)
Creatinine, Ser: 1.69 mg/dL — ABNORMAL HIGH (ref 0.61–1.24)
GFR, Estimated: 48 mL/min — ABNORMAL LOW (ref >60)
GFR, Estimated: 47 mL/min — ABNORMAL LOW (ref >60)
Glucose, Bld: 236 mg/dL — ABNORMAL HIGH (ref 70–99)
Glucose, Bld: 284 mg/dL — ABNORMAL HIGH (ref 70–99)
Potassium: 5.5 mmol/L — ABNORMAL HIGH (ref 3.5–5.1)
Potassium: 5.4 mmol/L — ABNORMAL HIGH (ref 3.5–5.1)
Sodium: 139 mmol/L (ref 135–145)
Sodium: 140 mmol/L (ref 135–145)

## 2021-06-16 LAB — CBC
HCT: 40.6 % (ref 39.0–52.0)
Hemoglobin: 12.2 g/dL — ABNORMAL LOW (ref 13.0–17.0)
MCH: 29.1 pg (ref 26.0–34.0)
MCHC: 30 g/dL (ref 30.0–36.0)
MCV: 96.9 fL (ref 80.0–100.0)
Platelets: 176 K/uL (ref 150–400)
RBC: 4.19 MIL/uL — ABNORMAL LOW (ref 4.22–5.81)
RDW: 11.9 % (ref 11.5–15.5)
WBC: 11.2 K/uL — ABNORMAL HIGH (ref 4.0–10.5)
nRBC: 0 % (ref 0.0–0.2)

## 2021-06-16 LAB — MAGNESIUM
Magnesium: 2.9 mg/dL — ABNORMAL HIGH (ref 1.7–2.4)
Magnesium: 3.4 mg/dL — ABNORMAL HIGH (ref 1.7–2.4)

## 2021-06-16 LAB — TRIGLYCERIDES
Triglycerides: 610 mg/dL — ABNORMAL HIGH
Triglycerides: 164 mg/dL — ABNORMAL HIGH (ref <150)

## 2021-06-16 LAB — GLUCOSE, CAPILLARY
Glucose-Capillary: 225 mg/dL — ABNORMAL HIGH (ref 70–99)
Glucose-Capillary: 182 mg/dL — ABNORMAL HIGH (ref 70–99)
Glucose-Capillary: 268 mg/dL — ABNORMAL HIGH (ref 70–99)
Glucose-Capillary: 263 mg/dL — ABNORMAL HIGH (ref 70–99)
Glucose-Capillary: 331 mg/dL — ABNORMAL HIGH (ref 70–99)
Glucose-Capillary: 207 mg/dL — ABNORMAL HIGH (ref 70–99)

## 2021-06-16 MED ORDER — INSULIN ASPART 100 UNIT/ML IJ SOLN
3.0000 [IU] | INTRAMUSCULAR | Status: DC
  Administered 2021-06-16 – 2021-06-18 (×4): 3 [IU] via SUBCUTANEOUS

## 2021-06-16 MED ORDER — BISACODYL 10 MG RE SUPP: 10.0000 mg | Freq: Every day | RECTAL | Status: DC | PRN

## 2021-06-16 MED ORDER — SODIUM ZIRCONIUM CYCLOSILICATE 10 G PO PACK
10.0000 g | PACK | Freq: Once | ORAL | Status: AC
  Administered 2021-06-16: 10 g
  Filled 2021-06-16: qty 1

## 2021-06-16 MED ORDER — PREDNISONE 20 MG PO TABS: 20.0000 mg | ORAL_TABLET | Freq: Every day | ORAL | Status: DC

## 2021-06-16 MED ORDER — SENNOSIDES-DOCUSATE SODIUM 8.6-50 MG PO TABS
1.0000 | ORAL_TABLET | Freq: Two times a day (BID) | ORAL | Status: DC
  Administered 2021-06-16 – 2021-07-04 (×34): 1
  Filled 2021-06-16 (×36): qty 1

## 2021-06-16 MED ORDER — PREDNISONE 20 MG PO TABS
30.0000 mg | ORAL_TABLET | Freq: Every day | ORAL | Status: AC
  Administered 2021-06-16 – 2021-06-17 (×2): 30 mg
  Filled 2021-06-16 (×2): qty 1

## 2021-06-16 MED ORDER — DEXTROSE 10 % IV SOLN: INTRAVENOUS | Status: DC

## 2021-06-16 MED ORDER — DILTIAZEM HCL 30 MG PO TABS
90.0000 mg | ORAL_TABLET | Freq: Four times a day (QID) | ORAL | Status: DC
Start: 2021-06-16 — End: 2021-06-16
  Administered 2021-06-16: 90 mg
  Filled 2021-06-16 (×2): qty 3

## 2021-06-16 MED ORDER — MIDAZOLAM HCL 2 MG/2ML IJ SOLN
INTRAMUSCULAR | Status: AC
  Administered 2021-06-17: 2 mg via INTRAVENOUS
  Filled 2021-06-16: qty 2

## 2021-06-16 MED ORDER — SODIUM CHLORIDE 0.9 % IV SOLN
3.0000 g | Freq: Three times a day (TID) | INTRAVENOUS | Status: DC
  Administered 2021-06-16 – 2021-06-17 (×3): 3 g via INTRAVENOUS
  Filled 2021-06-16 (×5): qty 8

## 2021-06-16 MED ORDER — PREDNISONE 10 MG PO TABS: 10.0000 mg | ORAL_TABLET | Freq: Every day | ORAL | Status: DC

## 2021-06-16 MED ORDER — AMIODARONE HCL IN DEXTROSE 360-4.14 MG/200ML-% IV SOLN
30.0000 mg/h | INTRAVENOUS | Status: DC
  Administered 2021-06-17 – 2021-06-19 (×6): 30 mg/h via INTRAVENOUS
  Filled 2021-06-16 (×5): qty 200

## 2021-06-16 MED ORDER — SODIUM ZIRCONIUM CYCLOSILICATE 10 G PO PACK
10.0000 g | PACK | Freq: Once | ORAL | Status: AC
  Administered 2021-06-16: 10 g

## 2021-06-16 MED ORDER — AMIODARONE HCL IN DEXTROSE 360-4.14 MG/200ML-% IV SOLN
60.0000 mg/h | INTRAVENOUS | Status: AC
  Administered 2021-06-16 – 2021-06-17 (×2): 60 mg/h via INTRAVENOUS
  Filled 2021-06-16 (×2): qty 200

## 2021-06-16 MED ORDER — VITAL HIGH PROTEIN PO LIQD
1000.0000 mL | ORAL | Status: DC
  Administered 2021-06-16: 1000 mL

## 2021-06-16 MED ORDER — INSULIN GLARGINE-YFGN 100 UNIT/ML ~~LOC~~ SOLN
18.0000 [IU] | Freq: Every day | SUBCUTANEOUS | Status: DC
Start: 2021-06-16 — End: 2021-06-17
  Administered 2021-06-16: 18 [IU] via SUBCUTANEOUS
  Filled 2021-06-16 (×2): qty 0.18

## 2021-06-16 MED ORDER — AMIODARONE LOAD VIA INFUSION
150.0000 mg | Freq: Once | INTRAVENOUS | Status: AC
  Administered 2021-06-16: 150 mg via INTRAVENOUS
  Filled 2021-06-16: qty 83.34

## 2021-06-16 NOTE — Progress Notes (Signed)
Pharmacy Antibiotic Note  Chris [name]. is a 55 y.o. male  with possible aspiration PNA.  Pharmacy has been consulted for Unasyn dosing. -CXR shows increase airspace opacities -CrCl ~ 60  Plan: -Unasyn 3gm IV q8h -Will follow renal function, cultures and clinical progress   Height: 5\' 9"  (175.3 cm) Weight: 112.9 kg (248 lb 14.4 oz) IBW/kg (Calculated) : 70.7  Temp (24hrs), Avg:100.4 F (38 C), Min:98.6 F (37 C), Max:102.3 F (39.1 C)  Recent Labs  Lab 06/30/2021 0412 06/30/2021 0420 06/14/21 0314 06/14/21 1035 06/15/21 1304 06/15/21 2121 06/16/21 0308 06/16/21 1100  WBC 13.7*  --  13.0*  --   --   --  11.2*  --   CREATININE  --    < > 1.95* 1.80* 1.33* 1.61* 1.68* 1.69*   < > = values in this interval not displayed.    Estimated Creatinine Clearance: 61.2 mL/min (A) (by C-G formula based on SCr of 1.69 mg/dL (H)).    Allergies  Allergen Reactions   Bactrim [Sulfamethoxazole-Trimethoprim] Hives    Antimicrobials this admission: 11/6 unasyn  Dose adjustments this admission:   Microbiology results:   Thank you for allowing pharmacy to be a part of this patient's care.  13/6, PharmD Clinical Pharmacist **Pharmacist phone directory can now be found on [email or web address] ([email or web address]).  Listed under [facility].

## 2021-06-16 NOTE — Progress Notes (Addendum)
NAME:  Chris [name]., MRN:  [id number], DOB:  [birth year], LOS: 3 ADMISSION DATE:  07/03/2021, CONSULTATION DATE:  06/17/2021 REFERRING MD:  Tera Helper, CHIEF COMPLAINT:  respiratory distress   History of Present Illness:  Chris [name]. Riedinger Chris [name] a 55 year old male with PMHx of chronic hypoxic respiratory failure 2/2 COPD on 2L oxygen at baseline, Afib on Eliquis, HFpEF, hypertension, type 2 diabetes mellitus, ARDS 2/2 influenza A requiring intubation and subsequent tracheostomy presenting with three days of fever, cough, and dsypnea in setting of sick contact. He notes that his grandson has been sick. He had his COVID vaccine booster earlier this week. He reports shortness of breath is worse with activity and lying flat. He also endorses decreased oral intake. Does endorse palpitations but denies any chest pain. Also has had nausea and vomiting at home. Patient initially  noted to be in Afib with RVR on presentation to the ED for which he was started on cardizem infusion and placed on BiPAP for respiratory distress and hypoxia. He was noted to be influenza positive. Patient admitted to hospitalist service. Patient with worsening respiratory status and concern for increased oral secretions for which PCCM consulted.   Pertinent  Medical History   Past Medical History:  Diagnosis Date   Anxiety    Atrial fibrillation (HCC)    CHF (congestive heart failure) (HCC)    COPD (chronic obstructive pulmonary disease) ([place])    Emphysema [[name]]   Depression    Diabetes mellitus without complication ([place])    type 2   Emphysema of lung ([place])    GERD (gastroesophageal reflux disease)    HIV disease ([place])    Hypertension    Oxygen deficiency      Significant Hospital Events: Including procedures, antibiotic start and stop dates in addition to other pertinent events   11/3> admitted to hospitalist service with influenza A; transferred to ICU for acute on chronic hypoxic respiratory failure;  intubated 11/4 extubated, maintained on bipap overnight, reintubated this morning  Interim History / Subjective:  Still having high fevers. Back in AF with RVR. Getting tylenol and ice packs.  No bowel movement.   Objective   Blood pressure (!) 88/66, pulse (!) 118, temperature (!) 102.1 F (38.9 C), temperature source Oral, resp. rate (!) 26, height 5\' 9"  (1.753 m), weight 112.9 kg, SpO2 100 %.    Vent Mode: PRVC FiO2 (%):  [70 %-100 %] 70 % Set Rate:  [26 bmp] 26 bmp Vt Set:  [480 mL] 480 mL PEEP:  [8 cmH20] 8 cmH20 Plateau Pressure:  [18 cmH20-21 cmH20] 20 cmH20   Intake/Output Summary (Last 24 hours) at 06/16/2021 0825 Last data filed at 06/16/2021 0754 Gross per 24 hour  Intake 3202.31 ml  Output 4755 ml  Net -1552.69 ml   Filed Weights   06/16/2021 0402 06/14/21 0340 06/16/21 0500  Weight: 112 kg 108 kg 112.9 kg    Examination: General: acutely ill appearing, intubated, sedated HENT: ETT to vent Lungs:  improved air entry, no wheezes Cardiovascular: iirregularly irregular, HR in 130s Abdomen: obese, soft, +BS Extremities: warm and dry, no edema Neuro: sedated. RASS -3 Skin: No rash  Resolved Hospital Problem list    Assessment & Plan:  Acute on chronic hypoxic and hypercarbic respiratory failure 2/2 Influenza A pneumonia COPD exacerbation History of ARDS secondary to Influenza A s/p tracheostomy in 2019 -Continue full ventilator support. Closely monitor I:E ratio and auto-PEEP. He has required Will allow permissive hypercapnia with  a goal pH>7.2. titrate down vent settings as tolerated -Continue tamiflu 30 mg twice daily for total 5 days.  -IV Solu-Medrol 40 mg - will start prednisone taper.  -Continue scheduled DuoNebs every 6 hours + Pulmicort twice daily+ albuterol every 4 hours as needed -Guaifenesin 10 mL per tube every 6 hours - ongoing fevers likely from flu  Acute metabolic toxic encephalopathy Likely in setting of acute restaurant failure as above.   Patient is currently on Precedex with fentanl and proprofol drips - RASS goal should be 0 -2 and he is currently 03. -Continue fentanyl gtt, propofol, will turn down precedex -Continue fentanyl and Versed as needed to maintain RASS  Atrial fibrillation with RVR- CHADS2VASc score 3 (HTN, CHF, diabetes). Patient is on diltiazem 240mg  daily and Eliquis 5mg  daily at home.  -continue enteral diltiazem. Will increase dose today. He is in worsening RVR likely due to fever.  -Continue Eliquis 5 mg twice daily -Continue cardiac monitor  AKI on CKDII Hyperkalemia Baseline sCr ~1.2 curently 1.6 with adequate uop -Trend renal function -Avoid nephrotoxic agents - repeat BMP pending in am  Decompensated HFpEF Hold on lasix today -Continue to monitor volume status  Type II DM CBGs still above goal on increased long acting. Likely worsened with steroids - increase Semglee to 18U daily. Will need to watch closely as prednisone taper starts.  - Continue SSI resistant q4h  - Continue CBG monitoring q4h  HIV, well controlled -On Biktarvy daily at home. -Currently on Descovy due to renal function  Hypertension Hyperlipidemia Holding home antihypertensive in setting of intermittent hypotension requiring vasopressor support  - Continue rosuvastatin 20mg  daily  Hyperkalemia 5.7 last night, given lokelma and improved to 5.5 no telemetry changes  Best Practice (right click and "Reselect all SmartList Selections" daily)   Diet/type: tubefeeds increasing bowel regimen today.  DVT prophylaxis: DOAC GI prophylaxis: PPI Lines: N/A Foley:  Yes, and it is still needed Code Status:  full code Last date of multidisciplinary goals of care discussion [11/5- wife updated at bedside]  Labs   CBC: Recent Labs  Lab 06/18/2021 0412 06/25/2021 0420 06/24/2021 2254 06/14/21 0314 06/15/21 0513 06/15/21 0842 06/16/21 0308  WBC 13.7*  --   --  13.0*  --   --  11.2*  NEUTROABS 9.8*  --   --   --   --   --    --   HGB 14.1   < > 14.6 12.7* 13.3 13.3 12.2*  HCT 45.7   < > 43.0 41.7 39.0 39.0 40.6  MCV 93.8  --   --  95.6  --   --  96.9  PLT 207  --   --  186  --   --  176   < > = values in this interval not displayed.    Basic Metabolic Panel: Recent Labs  Lab 06/14/21 0314 06/14/21 1035 06/15/21 0513 06/15/21 0842 06/15/21 1304 06/15/21 2121 06/16/21 0308  NA 135 135 134* 138 135 129* 139  K 5.2* 5.0 4.7 4.9 6.0* 5.7* 5.5*  CL 95* 97*  --   --  99 90* 99  CO2 32 32  --   --  30 29 35*  GLUCOSE 195* 178*  --   --  236* 335* 236*  BUN 39* 44*  --   --  52* 56* 54*  CREATININE 1.95* 1.80*  --   --  1.33* 1.61* 1.68*  CALCIUM 8.8* 8.6*  --   --  8.6* 8.0* 9.0  MG  --   --   --   --   --   --  2.9*   GFR: Estimated Creatinine Clearance: 61.6 mL/min (A) (by C-G formula based on SCr of 1.68 mg/dL (H)). Recent Labs  Lab 06/16/2021 0412 06/14/21 0314 06/16/21 0308  WBC 13.7* 13.0* 11.2*    Liver Function Tests: Recent Labs  Lab 06/10/21 1029 06/14/21 0314  AST 17 23  ALT 19 22  ALKPHOS  --  45  BILITOT 0.3 0.6  PROT 7.8 7.7  ALBUMIN  --  3.2*   No results for input(s): LIPASE, AMYLASE in the last 168 hours. No results for input(s): AMMONIA in the last 168 hours.  ABG    Component Value Date/Time   PHART 7.277 (L) 06/15/2021 0842   PCO2ART 82.5 (HH) 06/15/2021 0842   PO2ART 181 (H) 06/15/2021 0842   HCO3 38.5 (H) 06/15/2021 0842   TCO2 41 (H) 06/15/2021 0842   ACIDBASEDEF 3.2 (H) 12/14/2017 2135   O2SAT 99.0 06/15/2021 0842     Coagulation Profile: No results for input(s): INR, PROTIME in the last 168 hours.  Cardiac Enzymes: No results for input(s): CKTOTAL, CKMB, CKMBINDEX, TROPONINI in the last 168 hours.  HbA1C: HbA1c, POC (controlled diabetic range)  Date/Time Value Ref Range Status  04/05/2021 02:09 PM 7.8 (A) 0.0 - 7.0 % Final  04/20/2020 11:13 AM 8.2 (A) 0.0 - 7.0 % Final   Hgb A1c MFr Bld  Date/Time Value Ref Range Status  06/25/2021 04:12 AM 6.8  (H) 4.8 - 5.6 % Final    Comment:    (NOTE) Pre diabetes:          5.7%-6.4%  Diabetes:              >6.4%  Glycemic control for   <7.0% adults with diabetes   10/30/2020 02:25 AM 7.3 (H) 4.8 - 5.6 % Final    Comment:    (NOTE) Pre diabetes:          5.7%-6.4%  Diabetes:              >6.4%  Glycemic control for   <7.0% adults with diabetes     CBG: Recent Labs  Lab 06/15/21 1518 06/15/21 1919 06/15/21 2319 06/16/21 0313 06/16/21 0711  GLUCAP 283* 331* 292* 225* 182*    Critical care time: 35 minutes    The patient is critically ill due to respiratory failure.  Critical care was necessary to treat or prevent imminent or life-threatening deterioration.  Critical care was time spent personally by me on the following activities: development of treatment plan with patient and/or surrogate as well as nursing, discussions with consultants, evaluation of patient's response to treatment, examination of patient, obtaining history from patient or surrogate, ordering and performing treatments and interventions, ordering and review of laboratory studies, ordering and review of radiographic studies, pulse oximetry, re-evaluation of patient's condition and participation in multidisciplinary rounds.   Critical Care Time devoted to patient care services described in this note is 35 minutes. This time reflects time of care of this [place] . This critical care time does not reflect separately billable procedures or procedure time, teaching time or supervisory time of PA/NP/Med student/Med Resident etc but could involve care discussion time.       Spero Geralds [facility] 06/16/2021 8:25 AM  Pager: see AMION  If no response to pager , please call critical care on call (see AMION) until 7pm  After 7:00 pm call Elink

## 2021-06-16 NOTE — Progress Notes (Addendum)
eLink Physician-Brief Progress Note Patient Name: Chris [name]. DOB: 11/28/1965 MRN: [id number]   Date of Service  06/16/2021  HPI/Events of Note  Vomited TF - Large amount of TF suctioned from ETT. TF off at present. Desaturated to 87%. FiO2 increased to 100% with Sats = 93%. Patient is on Semglee insulin + Novolog SSI.  eICU Interventions  Plan: Portable CXR STAT. Increase PEEP to 10. D10W to run IV at 40 mL/hour.     Intervention Category Major Interventions: Other:  Lenell Antu 06/16/2021, 8:40 PM

## 2021-06-16 NOTE — Progress Notes (Signed)
eLink Physician-Brief Progress Note Patient Name: Chris [name]. DOB: 01/25/1966 MRN: [id number]   Date of Service  06/16/2021  HPI/Events of Note  Multiple issues: 1. Review of STAT portable CXR reveals increased streaky bibasilar airspace opacities, likely atelectasis and/or aspiration. Endotracheal tube with tip in the midthoracic trachea. Again seen is the possible coiled gastric decompression tube in the pharynx/larynx which I think is outside of the patient rather than in pharynx/larynx. 2. K+ = 5.4 at 11 AM and not addressed. 3. AFIB with RVR - Ventricular rate - 134. Cardizem per tube held d/t soft BP.  eICU Interventions  Plan: Lokelma 10 gm per tube now. Repeat BMP at 5 AM. D/C Cardizem per tube. Amiodarone IV load and infusion. Mg++ level STAT.      Intervention Category Major Interventions: Arrhythmia - evaluation and management;Hypoxemia - evaluation and management;Other:  Lenell Antu 06/16/2021, 9:38 PM

## 2021-06-16 NOTE — Progress Notes (Signed)
eLink Physician-Brief Progress Note Patient Name: Chris [name]. DOB: 1966/03/10 MRN: [id number]   Date of Service  06/16/2021  HPI/Events of Note  Hyperkalemia - K+ = 5.5.  eICU Interventions  Plan: Lokelma 10 gm per tube now. Repeat BMP at 11 AM.     Intervention Category Major Interventions: Electrolyte abnormality - evaluation and management  Blanchard Willhite Eugene 06/16/2021, 4:16 AM

## 2021-06-16 NOTE — Progress Notes (Signed)
eLink Physician-Brief Progress Note Patient Name: Chris [name]. DOB: 06/24/1966 MRN: [id number]   Date of Service  06/16/2021  HPI/Events of Note  Sats have dropped to 88%, RT took peep up to 12 and 14 with no improvement, back down to 12, RT wants to know if you want PRN Recruitments. Now with increased respiratory secretions. Bronchoscopy ???  eICU Interventions  Plan: Recruitment maneuver PRN. Will ask PCCM ground team to evaluate at bedside.      Intervention Category Major Interventions: Hypoxemia - evaluation and management  Jaquaya Coyle Eugene 06/16/2021, 11:12 PM

## 2021-06-16 NOTE — Progress Notes (Signed)
Progress Note   Spoke to patients spouse regarding second aspiration event this evening with resulting hypoxia despite uptitration of vent setting. Given concern for aspirations event decision made to preform bedside bronchostomy. Wife agreed to plan and wishes to be kept informed.   Harveer Sadler D. Tiburcio Pea, NP-C [facility] Personal contact information can be found on Amion  06/16/2021, 11:47 PM

## 2021-06-17 ENCOUNTER — Inpatient Hospital Stay (HOSPITAL_COMMUNITY): Payer: Medicare HMO | Admitting: Anesthesiology

## 2021-06-17 ENCOUNTER — Encounter (HOSPITAL_COMMUNITY): Admission: EM | Disposition: E | Payer: Self-pay | Source: Home / Self Care | Attending: Pulmonary Disease

## 2021-06-17 ENCOUNTER — Inpatient Hospital Stay (HOSPITAL_COMMUNITY): Payer: Medicare HMO

## 2021-06-17 ENCOUNTER — Encounter (HOSPITAL_COMMUNITY): Payer: Self-pay | Admitting: Internal Medicine

## 2021-06-17 DIAGNOSIS — J9601 Acute respiratory failure with hypoxia: Secondary | ICD-10-CM | POA: Diagnosis not present

## 2021-06-17 DIAGNOSIS — J9602 Acute respiratory failure with hypercapnia: Secondary | ICD-10-CM | POA: Diagnosis not present

## 2021-06-17 DIAGNOSIS — J101 Influenza due to other identified influenza virus with other respiratory manifestations: Secondary | ICD-10-CM | POA: Diagnosis not present

## 2021-06-17 DIAGNOSIS — R609 Edema, unspecified: Secondary | ICD-10-CM

## 2021-06-17 HISTORY — PX: ECMO CANNULATION: CATH118321

## 2021-06-17 LAB — CBC WITH DIFFERENTIAL/PLATELET
Abs Immature Granulocytes: 0.03 10*3/uL (ref 0.00–0.07)
Basophils Absolute: 0 10*3/uL (ref 0.0–0.1)
Basophils Relative: 0 %
Eosinophils Absolute: 0 10*3/uL (ref 0.0–0.5)
Eosinophils Relative: 0 %
HCT: 43.3 % (ref 39.0–52.0)
Hemoglobin: 12.4 g/dL — ABNORMAL LOW (ref 13.0–17.0)
Immature Granulocytes: 0 %
Lymphocytes Relative: 9 %
Lymphs Abs: 0.7 10*3/uL (ref 0.7–4.0)
MCH: 28.8 pg (ref 26.0–34.0)
MCHC: 28.6 g/dL — ABNORMAL LOW (ref 30.0–36.0)
MCV: 100.5 fL — ABNORMAL HIGH (ref 80.0–100.0)
Monocytes Absolute: 0.8 10*3/uL (ref 0.1–1.0)
Monocytes Relative: 10 %
Neutro Abs: 6.3 10*3/uL (ref 1.7–7.7)
Neutrophils Relative %: 81 %
Platelets: 157 10*3/uL (ref 150–400)
RBC: 4.31 MIL/uL (ref 4.22–5.81)
RDW: 12 % (ref 11.5–15.5)
WBC: 7.9 10*3/uL (ref 4.0–10.5)
nRBC: 0 % (ref 0.0–0.2)

## 2021-06-17 LAB — GLUCOSE, CAPILLARY
Glucose-Capillary: 114 mg/dL — ABNORMAL HIGH (ref 70–99)
Glucose-Capillary: 119 mg/dL — ABNORMAL HIGH (ref 70–99)
Glucose-Capillary: 120 mg/dL — ABNORMAL HIGH (ref 70–99)
Glucose-Capillary: 145 mg/dL — ABNORMAL HIGH (ref 70–99)
Glucose-Capillary: 154 mg/dL — ABNORMAL HIGH (ref 70–99)
Glucose-Capillary: 178 mg/dL — ABNORMAL HIGH (ref 70–99)
Glucose-Capillary: 229 mg/dL — ABNORMAL HIGH (ref 70–99)
Glucose-Capillary: 269 mg/dL — ABNORMAL HIGH (ref 70–99)
Glucose-Capillary: 282 mg/dL — ABNORMAL HIGH (ref 70–99)

## 2021-06-17 LAB — POCT I-STAT 7, (LYTES, BLD GAS, ICA,H+H)
Acid-Base Excess: 13 mmol/L — ABNORMAL HIGH (ref 0.0–2.0)
Acid-Base Excess: 10 mmol/L — ABNORMAL HIGH (ref 0.0–2.0)
Acid-Base Excess: 8 mmol/L — ABNORMAL HIGH (ref 0.0–2.0)
Acid-Base Excess: 7 mmol/L — ABNORMAL HIGH (ref 0.0–2.0)
Acid-Base Excess: 9 mmol/L — ABNORMAL HIGH (ref 0.0–2.0)
Acid-Base Excess: 8 mmol/L — ABNORMAL HIGH (ref 0.0–2.0)
Acid-Base Excess: 8 mmol/L — ABNORMAL HIGH (ref 0.0–2.0)
Acid-Base Excess: 8 mmol/L — ABNORMAL HIGH (ref 0.0–2.0)
Bicarbonate: 41.4 mmol/L — ABNORMAL HIGH (ref 20.0–28.0)
Bicarbonate: 42.3 mmol/L — ABNORMAL HIGH (ref 20.0–28.0)
Bicarbonate: 40.4 mmol/L — ABNORMAL HIGH (ref 20.0–28.0)
Bicarbonate: 38.4 mmol/L — ABNORMAL HIGH (ref 20.0–28.0)
Bicarbonate: 37.6 mmol/L — ABNORMAL HIGH (ref 20.0–28.0)
Bicarbonate: 32.4 mmol/L — ABNORMAL HIGH (ref 20.0–28.0)
Bicarbonate: 33.4 mmol/L — ABNORMAL HIGH (ref 20.0–28.0)
Bicarbonate: 34.2 mmol/L — ABNORMAL HIGH (ref 20.0–28.0)
Calcium, Ion: 1.13 mmol/L — ABNORMAL LOW (ref 1.15–1.40)
Calcium, Ion: 1.23 mmol/L (ref 1.15–1.40)
Calcium, Ion: 1.2 mmol/L (ref 1.15–1.40)
Calcium, Ion: 1.15 mmol/L (ref 1.15–1.40)
Calcium, Ion: 1.06 mmol/L — ABNORMAL LOW (ref 1.15–1.40)
Calcium, Ion: 1 mmol/L — ABNORMAL LOW (ref 1.15–1.40)
Calcium, Ion: 1.03 mmol/L — ABNORMAL LOW (ref 1.15–1.40)
Calcium, Ion: 1.07 mmol/L — ABNORMAL LOW (ref 1.15–1.40)
HCT: 38 % — ABNORMAL LOW (ref 39.0–52.0)
HCT: 40 % (ref 39.0–52.0)
HCT: 40 % (ref 39.0–52.0)
HCT: 39 % (ref 39.0–52.0)
HCT: 32 % — ABNORMAL LOW (ref 39.0–52.0)
HCT: 30 % — ABNORMAL LOW (ref 39.0–52.0)
HCT: 31 % — ABNORMAL LOW (ref 39.0–52.0)
HCT: 31 % — ABNORMAL LOW (ref 39.0–52.0)
Hemoglobin: 12.9 g/dL — ABNORMAL LOW (ref 13.0–17.0)
Hemoglobin: 13.6 g/dL (ref 13.0–17.0)
Hemoglobin: 13.6 g/dL (ref 13.0–17.0)
Hemoglobin: 13.3 g/dL (ref 13.0–17.0)
Hemoglobin: 10.9 g/dL — ABNORMAL LOW (ref 13.0–17.0)
Hemoglobin: 10.2 g/dL — ABNORMAL LOW (ref 13.0–17.0)
Hemoglobin: 10.5 g/dL — ABNORMAL LOW (ref 13.0–17.0)
Hemoglobin: 10.5 g/dL — ABNORMAL LOW (ref 13.0–17.0)
O2 Saturation: 91 %
O2 Saturation: 78 %
O2 Saturation: 74 %
O2 Saturation: 85 %
O2 Saturation: 100 %
O2 Saturation: 96 %
O2 Saturation: 99 %
O2 Saturation: 90 %
Patient temperature: 99.4
Patient temperature: 99
Patient temperature: 99
Patient temperature: 99
Patient temperature: 36.6
Patient temperature: 36.7
Patient temperature: 36.6
Potassium: 4.2 mmol/L (ref 3.5–5.1)
Potassium: 4.6 mmol/L (ref 3.5–5.1)
Potassium: 4.7 mmol/L (ref 3.5–5.1)
Potassium: 5.4 mmol/L — ABNORMAL HIGH (ref 3.5–5.1)
Potassium: 5.6 mmol/L — ABNORMAL HIGH (ref 3.5–5.1)
Potassium: 5.1 mmol/L (ref 3.5–5.1)
Potassium: 5.2 mmol/L — ABNORMAL HIGH (ref 3.5–5.1)
Potassium: 4.9 mmol/L (ref 3.5–5.1)
Sodium: 140 mmol/L (ref 135–145)
Sodium: 144 mmol/L (ref 135–145)
Sodium: 143 mmol/L (ref 135–145)
Sodium: 143 mmol/L (ref 135–145)
Sodium: 142 mmol/L (ref 135–145)
Sodium: 140 mmol/L (ref 135–145)
Sodium: 143 mmol/L (ref 135–145)
Sodium: 141 mmol/L (ref 135–145)
TCO2: 43 mmol/L — ABNORMAL HIGH (ref 22–32)
TCO2: 45 mmol/L — ABNORMAL HIGH (ref 22–32)
TCO2: 44 mmol/L — ABNORMAL HIGH (ref 22–32)
TCO2: 41 mmol/L — ABNORMAL HIGH (ref 22–32)
TCO2: 40 mmol/L — ABNORMAL HIGH (ref 22–32)
TCO2: 34 mmol/L — ABNORMAL HIGH (ref 22–32)
TCO2: 35 mmol/L — ABNORMAL HIGH (ref 22–32)
TCO2: 36 mmol/L — ABNORMAL HIGH (ref 22–32)
pCO2 arterial: 70.3 mmHg (ref 32.0–48.0)
pCO2 arterial: 106.7 mmHg (ref 32.0–48.0)
pCO2 arterial: 104.6 mmHg (ref 32.0–48.0)
pCO2 arterial: 97.2 mmHg (ref 32.0–48.0)
pCO2 arterial: 73 mmHg (ref 32.0–48.0)
pCO2 arterial: 41 mmHg (ref 32.0–48.0)
pCO2 arterial: 49.9 mmHg — ABNORMAL HIGH (ref 32.0–48.0)
pCO2 arterial: 56.9 mmHg — ABNORMAL HIGH (ref 32.0–48.0)
pH, Arterial: 7.38 (ref 7.350–7.450)
pH, Arterial: 7.207 — ABNORMAL LOW (ref 7.350–7.450)
pH, Arterial: 7.196 — CL (ref 7.350–7.450)
pH, Arterial: 7.206 — ABNORMAL LOW (ref 7.350–7.450)
pH, Arterial: 7.321 — ABNORMAL LOW (ref 7.350–7.450)
pH, Arterial: 7.432 (ref 7.350–7.450)
pH, Arterial: 7.504 — ABNORMAL HIGH (ref 7.350–7.450)
pH, Arterial: 7.386 (ref 7.350–7.450)
pO2, Arterial: 68 mmHg — ABNORMAL LOW (ref 83.0–108.0)
pO2, Arterial: 55 mmHg — ABNORMAL LOW (ref 83.0–108.0)
pO2, Arterial: 52 mmHg — ABNORMAL LOW (ref 83.0–108.0)
pO2, Arterial: 65 mmHg — ABNORMAL LOW (ref 83.0–108.0)
pO2, Arterial: 279 mmHg — ABNORMAL HIGH (ref 83.0–108.0)
pO2, Arterial: 138 mmHg — ABNORMAL HIGH (ref 83.0–108.0)
pO2, Arterial: 77 mmHg — ABNORMAL LOW (ref 83.0–108.0)
pO2, Arterial: 61 mmHg — ABNORMAL LOW (ref 83.0–108.0)

## 2021-06-17 LAB — BASIC METABOLIC PANEL
Anion gap: 8 (ref 5–15)
Anion gap: 12 (ref 5–15)
BUN: 66 mg/dL — ABNORMAL HIGH (ref 6–20)
BUN: 86 mg/dL — ABNORMAL HIGH (ref 6–20)
CO2: 33 mmol/L — ABNORMAL HIGH (ref 22–32)
CO2: 30 mmol/L (ref 22–32)
Calcium: 9.1 mg/dL (ref 8.9–10.3)
Calcium: 8 mg/dL — ABNORMAL LOW (ref 8.9–10.3)
Chloride: 102 mmol/L (ref 98–111)
Chloride: 98 mmol/L (ref 98–111)
Creatinine, Ser: 1.64 mg/dL — ABNORMAL HIGH (ref 0.61–1.24)
Creatinine, Ser: 2.79 mg/dL — ABNORMAL HIGH (ref 0.61–1.24)
GFR, Estimated: 49 mL/min — ABNORMAL LOW (ref >60)
GFR, Estimated: 26 mL/min — ABNORMAL LOW (ref >60)
Glucose, Bld: 149 mg/dL — ABNORMAL HIGH (ref 70–99)
Glucose, Bld: 239 mg/dL — ABNORMAL HIGH (ref 70–99)
Potassium: 5.1 mmol/L (ref 3.5–5.1)
Potassium: 5.5 mmol/L — ABNORMAL HIGH (ref 3.5–5.1)
Sodium: 143 mmol/L (ref 135–145)
Sodium: 140 mmol/L (ref 135–145)

## 2021-06-17 LAB — CBC
HCT: 34.2 % — ABNORMAL LOW (ref 39.0–52.0)
Hemoglobin: 10 g/dL — ABNORMAL LOW (ref 13.0–17.0)
MCH: 29.4 pg (ref 26.0–34.0)
MCHC: 29.2 g/dL — ABNORMAL LOW (ref 30.0–36.0)
MCV: 100.6 fL — ABNORMAL HIGH (ref 80.0–100.0)
Platelets: 169 10*3/uL (ref 150–400)
RBC: 3.4 MIL/uL — ABNORMAL LOW (ref 4.22–5.81)
RDW: 12.2 % (ref 11.5–15.5)
WBC: 15 10*3/uL — ABNORMAL HIGH (ref 4.0–10.5)
nRBC: 0 % (ref 0.0–0.2)

## 2021-06-17 LAB — PROTIME-INR
INR: 1.7 — ABNORMAL HIGH (ref 0.8–1.2)
Prothrombin Time: 20 seconds — ABNORMAL HIGH (ref 11.4–15.2)

## 2021-06-17 LAB — POCT I-STAT EG7
Acid-Base Excess: 8 mmol/L — ABNORMAL HIGH (ref 0.0–2.0)
Bicarbonate: 34.2 mmol/L — ABNORMAL HIGH (ref 20.0–28.0)
Calcium, Ion: 1.02 mmol/L — ABNORMAL LOW (ref 1.15–1.40)
HCT: 30 % — ABNORMAL LOW (ref 39.0–52.0)
Hemoglobin: 10.2 g/dL — ABNORMAL LOW (ref 13.0–17.0)
O2 Saturation: 49 %
Patient temperature: 36.6
Potassium: 5.2 mmol/L — ABNORMAL HIGH (ref 3.5–5.1)
Sodium: 142 mmol/L (ref 135–145)
TCO2: 36 mmol/L — ABNORMAL HIGH (ref 22–32)
pCO2, Ven: 54 mmHg (ref 44.0–60.0)
pH, Ven: 7.408 (ref 7.250–7.430)
pO2, Ven: 26 mmHg — CL (ref 32.0–45.0)

## 2021-06-17 LAB — HEPATIC FUNCTION PANEL
ALT: 26 U/L (ref 0–44)
AST: 62 U/L — ABNORMAL HIGH (ref 15–41)
Albumin: 2.2 g/dL — ABNORMAL LOW (ref 3.5–5.0)
Alkaline Phosphatase: 32 U/L — ABNORMAL LOW (ref 38–126)
Bilirubin, Direct: 1.1 mg/dL — ABNORMAL HIGH (ref 0.0–0.2)
Indirect Bilirubin: 1.5 mg/dL — ABNORMAL HIGH (ref 0.3–0.9)
Total Bilirubin: 2.6 mg/dL — ABNORMAL HIGH (ref 0.3–1.2)
Total Protein: 5.7 g/dL — ABNORMAL LOW (ref 6.5–8.1)

## 2021-06-17 LAB — APTT
aPTT: 30 seconds (ref 24–36)
aPTT: 75 seconds — ABNORMAL HIGH (ref 24–36)

## 2021-06-17 LAB — POCT ACTIVATED CLOTTING TIME
Activated Clotting Time: 167 seconds
Activated Clotting Time: 219 seconds
Activated Clotting Time: 161 seconds

## 2021-06-17 LAB — LACTIC ACID, PLASMA
Lactic Acid, Venous: 2.7 mmol/L (ref 0.5–1.9)
Lactic Acid, Venous: 2.2 mmol/L (ref 0.5–1.9)

## 2021-06-17 LAB — PREPARE RBC (CROSSMATCH)

## 2021-06-17 LAB — SURGICAL PCR SCREEN
MRSA, PCR: NEGATIVE
Staphylococcus aureus: POSITIVE — AB

## 2021-06-17 LAB — FIBRINOGEN: Fibrinogen: >800 mg/dL — ABNORMAL HIGH (ref 210–475)

## 2021-06-17 LAB — HEPARIN LEVEL (UNFRACTIONATED)
Heparin Unfractionated: >1.1 IU/mL — ABNORMAL HIGH (ref 0.30–0.70)
Heparin Unfractionated: >1.1 IU/mL — ABNORMAL HIGH (ref 0.30–0.70)

## 2021-06-17 LAB — LACTATE DEHYDROGENASE: LDH: 224 U/L — ABNORMAL HIGH (ref 98–192)

## 2021-06-17 LAB — MAGNESIUM: Magnesium: 2.9 mg/dL — ABNORMAL HIGH (ref 1.7–2.4)

## 2021-06-17 SURGERY — ECMO CANNULATION
Anesthesia: General

## 2021-06-17 MED ORDER — AMIODARONE LOAD VIA INFUSION
150.0000 mg | Freq: Once | INTRAVENOUS | Status: AC
Start: 2021-06-17 — End: 2021-06-17
  Administered 2021-06-17: 150 mg via INTRAVENOUS
  Filled 2021-06-17: qty 83.34

## 2021-06-17 MED ORDER — VANCOMYCIN VARIABLE DOSE PER UNSTABLE RENAL FUNCTION (PHARMACIST DOSING): Status: DC

## 2021-06-17 MED ORDER — SODIUM CHLORIDE 0.9 % IV SOLN
0.2800 mg/kg/h | INTRAVENOUS | Status: DC
  Administered 2021-06-17: 0.05 mg/kg/h via INTRAVENOUS
  Administered 2021-06-19: 0.13 mg/kg/h via INTRAVENOUS
  Administered 2021-06-20: 0.18 mg/kg/h via INTRAVENOUS
  Filled 2021-06-17 (×3): qty 250

## 2021-06-17 MED ORDER — VANCOMYCIN HCL 1250 MG/250ML IV SOLN: 1250.0000 mg | INTRAVENOUS | Status: DC

## 2021-06-17 MED ORDER — SODIUM CHLORIDE 0.9 % IV SOLN
2.0000 g | INTRAVENOUS | Status: DC
  Administered 2021-06-17: 2 g via INTRAVENOUS
  Filled 2021-06-17 (×2): qty 20

## 2021-06-17 MED ORDER — CHLORHEXIDINE GLUCONATE CLOTH 2 % EX PADS
6.0000 | MEDICATED_PAD | Freq: Once | CUTANEOUS | Status: AC
  Administered 2021-06-17: 6 via TOPICAL

## 2021-06-17 MED ORDER — VECURONIUM BROMIDE 10 MG IV SOLR
0.1000 mg/kg | Freq: Once | INTRAVENOUS | Status: AC
  Administered 2021-06-17: 11.3 mg via INTRAVENOUS
  Filled 2021-06-17: qty 20

## 2021-06-17 MED ORDER — PANTOPRAZOLE SODIUM 40 MG IV SOLR
40.0000 mg | Freq: Every day | INTRAVENOUS | Status: DC
  Administered 2021-06-17 – 2021-06-26 (×10): 40 mg via INTRAVENOUS
  Filled 2021-06-17 (×11): qty 40

## 2021-06-17 MED ORDER — ROCURONIUM BROMIDE 100 MG/10ML IV SOLN
INTRAVENOUS | Status: DC | PRN
  Administered 2021-06-17 (×2): 50 mg via INTRAVENOUS

## 2021-06-17 MED ORDER — METHYLPREDNISOLONE SODIUM SUCC 125 MG IJ SOLR
40.0000 mg | Freq: Two times a day (BID) | INTRAMUSCULAR | Status: DC
  Administered 2021-06-17 – 2021-06-19 (×4): 40 mg via INTRAVENOUS
  Filled 2021-06-17 (×4): qty 2

## 2021-06-17 MED ORDER — INSULIN GLARGINE-YFGN 100 UNIT/ML ~~LOC~~ SOLN
10.0000 [IU] | Freq: Every day | SUBCUTANEOUS | Status: DC
Start: 2021-06-18 — End: 2021-06-18
  Filled 2021-06-17: qty 0.1

## 2021-06-17 MED ORDER — SODIUM CHLORIDE 0.9% FLUSH
10.0000 mL | Freq: Two times a day (BID) | INTRAVENOUS | Status: DC
Start: 2021-06-17 — End: 2021-07-06
  Administered 2021-06-17 – 2021-07-02 (×25): 10 mL
  Administered 2021-07-03: 40 mL
  Administered 2021-07-04: 20 mL
  Administered 2021-07-04 – 2021-07-05 (×2): 10 mL

## 2021-06-17 MED ORDER — VECURONIUM BROMIDE 10 MG IV SOLR
INTRAVENOUS | Status: AC
  Filled 2021-06-17: qty 10

## 2021-06-17 MED ORDER — MUPIROCIN 2 % EX OINT
1.0000 "application " | TOPICAL_OINTMENT | Freq: Two times a day (BID) | CUTANEOUS | Status: AC
  Administered 2021-06-17 – 2021-06-22 (×10): 1 via NASAL
  Filled 2021-06-17: qty 22

## 2021-06-17 MED ORDER — ALBUMIN HUMAN 5 % IV SOLN
INTRAVENOUS | Status: AC
  Filled 2021-06-17: qty 500

## 2021-06-17 MED ORDER — VANCOMYCIN HCL 10 G IV SOLR
2500.0000 mg | Freq: Once | INTRAVENOUS | Status: AC
  Administered 2021-06-17: 2500 mg via INTRAVENOUS
  Filled 2021-06-17: qty 2500

## 2021-06-17 MED ORDER — HEPARIN SODIUM (PORCINE) 1000 UNIT/ML IJ SOLN
INTRAMUSCULAR | Status: DC | PRN
  Administered 2021-06-17: 7000 [IU] via INTRAVENOUS
  Administered 2021-06-17: 3000 [IU] via INTRAVENOUS

## 2021-06-17 MED ORDER — AMIODARONE IV BOLUS ONLY 150 MG/100ML
150.0000 mg | Freq: Once | INTRAVENOUS | Status: AC
  Administered 2021-06-17: 150 mg via INTRAVENOUS
  Filled 2021-06-17: qty 100

## 2021-06-17 MED ORDER — SODIUM CHLORIDE 0.9 % IV SOLN: INTRAVENOUS | Status: DC

## 2021-06-17 MED ORDER — NOREPINEPHRINE 4 MG/250ML-% IV SOLN
0.0000 ug/min | INTRAVENOUS | Status: DC
  Administered 2021-06-17: 2 ug/min via INTRAVENOUS
  Administered 2021-06-18: 11 ug/min via INTRAVENOUS
  Filled 2021-06-17 (×4): qty 250

## 2021-06-17 MED ORDER — HEPARIN (PORCINE) IN NACL 1000-0.9 UT/500ML-% IV SOLN
INTRAVENOUS | Status: DC | PRN
  Administered 2021-06-17: 500 mL

## 2021-06-17 MED ORDER — SODIUM CHLORIDE 0.9% FLUSH: 10.0000 mL | INTRAVENOUS | Status: DC | PRN

## 2021-06-17 MED ORDER — AMIODARONE IV BOLUS ONLY 150 MG/100ML: 150.0000 mg | Freq: Once | INTRAVENOUS | Status: DC

## 2021-06-17 MED ORDER — SODIUM CHLORIDE 0.9% IV SOLUTION: Freq: Once | INTRAVENOUS | Status: AC

## 2021-06-17 MED ORDER — ALBUMIN HUMAN 5 % IV SOLN: INTRAVENOUS | Status: DC | PRN

## 2021-06-17 MED ORDER — SODIUM CHLORIDE 0.9 % IV SOLN: 250.0000 mL | INTRAVENOUS | Status: DC

## 2021-06-17 MED ORDER — SODIUM CHLORIDE 0.9 % IV SOLN
0.0500 mg/kg/h | INTRAVENOUS | Status: DC
  Filled 2021-06-17: qty 250

## 2021-06-17 MED ORDER — VECURONIUM BROMIDE 10 MG IV SOLR
10.0000 mg | Freq: Once | INTRAVENOUS | Status: AC
  Administered 2021-06-17: 10 mg via INTRAVENOUS
  Filled 2021-06-17: qty 10

## 2021-06-17 MED ORDER — NOREPINEPHRINE 16 MG/250ML-% IV SOLN
0.0000 ug/min | INTRAVENOUS | Status: DC
Start: 2021-06-17 — End: 2021-06-17

## 2021-06-17 MED ORDER — MIDAZOLAM HCL 2 MG/2ML IJ SOLN: 2.0000 mg | Freq: Once | INTRAMUSCULAR | Status: AC

## 2021-06-17 MED ORDER — SODIUM CHLORIDE 0.9 % IV SOLN: INTRAVENOUS | Status: DC | PRN

## 2021-06-17 MED ORDER — HEPARIN (PORCINE) 25000 UT/250ML-% IV SOLN: 1400.0000 [IU]/h | INTRAVENOUS | Status: DC

## 2021-06-17 SURGICAL SUPPLY — 11 items
CATH DUAL LUMEN CRESCENT 32FR (CATHETERS) ×2 IMPLANT
GUIDEWIRE SAFE TJ AMPLATZ EXST (WIRE) ×2 IMPLANT
KIT MICROPUNCTURE NIT STIFF (SHEATH) ×2 IMPLANT
KIT VASCULAR ACCESS OPUS (MISCELLANEOUS) ×2 IMPLANT
PACK CARDIAC CATHETERIZATION (CUSTOM PROCEDURE TRAY) ×2 IMPLANT
PROTECTION STATION PRESSURIZED (MISCELLANEOUS) ×2
SET TUBING HLS ADVANCED 7.0 (TUBING) ×2 IMPLANT
SHEATH PINNACLE 6F 10CM (SHEATH) ×2 IMPLANT
SHEATH PROBE COVER 6X72 (BAG) ×2 IMPLANT
STATION PROTECTION PRESSURIZED (MISCELLANEOUS) ×1 IMPLANT
WIRE AMPLATZ SS-J .035X180CM (WIRE) ×2 IMPLANT

## 2021-06-17 NOTE — Progress Notes (Signed)
eLink Physician-Brief Progress Note Patient Name: Chris [name]. DOB: Mar 22, 1966 MRN: [id number]   Date of Service  06/28/2021  HPI/Events of Note  ABG on 100%/PRVC 26/TV 480/P 10 = 7.28/70.3/68/41.4 91% saturation. Nursing reports that Vecuronium X 1 did not improve O2 saturation.  eICU Interventions  Plan: Continue present management.     Intervention Category Major Interventions: Hypoxemia - evaluation and management;Respiratory failure - evaluation and management  Navy Belay Eugene 07/02/2021, 4:21 AM

## 2021-06-17 NOTE — Anesthesia Preprocedure Evaluation (Addendum)
Anesthesia Evaluation  Patient identified by MRN, date of birth, ID band Patient unresponsive    Reviewed: Allergy & Precautions, Patient's Chart, lab work & pertinent test results, Unable to perform ROS - Chart review onlyPreop documentation limited or incomplete due to emergent nature of procedure.  History of Anesthesia Complications Negative for: history of anesthetic complications  Airway Mallampati: Intubated       Dental   Pulmonary sleep apnea , pneumonia, COPD, Patient abstained from smoking., former smoker,  Refractory hypoxia 2/2 influenza requiring vent support, ECMO consult.   Covid-19 Nucleic Acid Test Results Lab Results      Component                Value               Date                      SARSCOV2NAA              NEGATIVE            06/13/2021                SARSCOV2NAA              NEGATIVE            10/29/2020                SARSCOV2NAA              Not Detected        09/24/2020                SARSCOV2NAA              NEGATIVE            04/30/2020                SARSCOV2NAA              NEGATIVE            10/06/2019               + decreased breath sounds      Cardiovascular hypertension, +CHF and + DOE   Rhythm:Regular     Neuro/Psych PSYCHIATRIC DISORDERS Anxiety Depression    GI/Hepatic Neg liver ROS, GERD  Medicated and Controlled,  Endo/Other  diabetes  Renal/GU Renal diseaseLab Results      Component                Value               Date                      CREATININE               1.64 (H)            06/24/2021           Lab Results      Component                Value               Date                      K                        4.7  06/21/2021                Musculoskeletal  (+) Arthritis ,   Abdominal   Peds  Hematology  (+) Blood dyscrasia, anemia , HIV, Lab Results      Component                Value               Date                      WBC                       7.9                 06/18/2021                HGB                      13.6                07/09/2021                HCT                      40.0                07/01/2021                MCV                      100.5 (H)           06/29/2021                PLT                      157                 06/14/2021              Anesthesia Other Findings   Reproductive/Obstetrics                            Anesthesia Physical Anesthesia Plan  ASA: 5  Anesthesia Plan: General   Post-op Pain Management:    Induction: Intravenous  PONV Risk Score and Plan: 2 and Treatment may vary due to age or medical condition  Airway Management Planned: Oral ETT  Additional Equipment: Arterial line and CVP  Intra-op Plan:   Post-operative Plan: Post-operative intubation/ventilation  Informed Consent:   Plan Discussed with: CRNA and Anesthesiologist  Anesthesia Plan Comments:         Anesthesia Quick Evaluation

## 2021-06-17 NOTE — Consult Note (Signed)
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 point. On Eliquis at home.  - Will manage on bivalirudin after cannulation.  - Amiodarone gtt for rate control.  - Consider cardioversion in future.  3.  AKI: Creatinine up to 1.6.  Follow closely.  4. Type 2 DM: SSI.  5. Influenza: Severe.  On oseltamivir.  6. COPD: Still smokes 1-2 cigs/day.  Intermittent home oxygen.  Followed by pulmonary, has been stable.  7. HIV: Last viral load was undetectable.  On treatment.    Length of Stay: 4  Loralie Champagne, MD  06/12/2021, 3:59 PM  Advanced Heart Failure Team Pager [phone] (M-F; 7a - 5p)  Please contact [facility] for night-coverage after hours (4p -7a ) and weekends on [email or web address]

## 2021-06-17 NOTE — Progress Notes (Signed)
eLink Physician-Brief Progress Note Patient Name: Chris [name]. DOB: 08/07/1966 MRN: [id number]   Date of Service  06/16/2021  HPI/Events of Note  Multiple issues: 1. Urinary retention - Foley catheter removed yesterday. Now with urinary retention again. I/O cathed for 600 mL and 2. Nursing request for CBC in AM.  eICU Interventions  Plan: Place Foley catheter. CBC with platelets in AM.     Intervention Category Major Interventions: Other:  Lenell Antu 06/26/2021, 12:39 AM

## 2021-06-17 NOTE — Progress Notes (Signed)
ANTICOAGULATION CONSULT NOTE - Initial Consult  Pharmacy Consult for Bivalirudin Indication:  ECMO and atrial fibrillation  Allergies  Allergen Reactions   Bactrim [Sulfamethoxazole-Trimethoprim] Hives    Patient Measurements: Height: 5\' 9"  (175.3 cm) Weight: 112.9 kg (248 lb 14.4 oz) IBW/kg (Calculated) : 70.7  Vital Signs: Temp: 97.5 F (36.4 C) (11/07 1520) Temp Source: Axillary (11/07 1520) BP: 92/47 (11/07 1105) Pulse Rate: 111 (11/07 1513)  Labs: Recent Labs    06/16/21 0308 06/16/21 1100 06/16/21 1231 06/16/2021 0257 07/09/2021 0408 06/24/2021 1132 06/22/2021 1139 06/25/2021 1233 06/16/2021 1642 06/14/2021 1857 06/12/2021 1905 06/29/2021 1919 07/04/2021 1925  HGB 12.2*  --    < > 12.4*   < >  --    < >  --    < > 10.0* 10.5* 10.2* 10.2*  HCT 40.6  --    < > 43.3   < >  --    < >  --    < > 34.2* 31.0* 30.0* 30.0*  PLT 176  --   --  157  --   --   --   --   --  169  --   --   --   APTT  --   --   --   --   --  30  --   --   --  75*  --   --   --   LABPROT  --   --   --   --   --   --   --   --   --  20.0*  --   --   --   INR  --   --   --   --   --   --   --   --   --  1.7*  --   --   --   HEPARINUNFRC  --   --   --   --   --   --   --  >1.10*  --   --   --   --   --   CREATININE 1.68* 1.69*  --  1.64*  --   --   --   --   --   --   --   --   --    < > = values in this interval not displayed.    Estimated Creatinine Clearance: 63.1 mL/min (A) (by C-G formula based on SCr of 1.64 mg/dL (H)).   Medical History: Past Medical History:  Diagnosis Date   Anxiety    Atrial fibrillation (HCC)    CHF (congestive heart failure) (HCC)    COPD (chronic obstructive pulmonary disease) (HCC)    Emphysema [[name]]   Depression    Diabetes mellitus without complication (HCC)    type 2   Emphysema of lung (HCC)    GERD (gastroesophageal reflux disease)    HIV disease (HCC)    Hypertension    Oxygen deficiency     Assessment: 55 Y/O male on chronic apixaban for atrial  fibrillation. Was on heparin in preparation for line placement and possible procedures. Last apixaban dose given @0948  11/7. Patient cannulated and ECMO initiated this afternoon.  Switched from heparin infusion to bivalirudin.  Post cannulation aPTT 75 seconds - will start bivalirudin  Goal of Therapy:  aPTT 50 - 80 seconds Monitor platelets by anticoagulation protocol: Yes   Plan:  Start bivalirudin 0.05 mg/kg/hr based on adjusted body weight. Check aPTT in 4 hours  Jeanella Cara, PharmD, [facility] Clinical Pharmacist Please see AMION for all Pharmacists' Contact Phone Numbers 14-Jul-2021, 8:28 PM

## 2021-06-17 NOTE — Transfer of Care (Signed)
Immediate Anesthesia Transfer of Care Note  Patient: Chris [name].  Procedure(s) Performed: ECMO CANNULATION  Patient Location: ICU  Anesthesia Type:General  Level of Consciousness: Patient remains intubated per anesthesia plan  Airway & Oxygen Therapy: Patient remains intubated per anesthesia plan and Patient placed on Ventilator (see vital sign flow sheet for setting)  Post-op Assessment: Report given to RN  Post vital signs: Reviewed and stable  Last Vitals:  Vitals Value Taken Time  BP    Temp    Pulse    Resp 18 07/10/2021 1812  SpO2 100 % 07/07/2021 1812  Vitals shown include unvalidated device data.  Last Pain:  Vitals:   06/12/2021 1520  TempSrc: Axillary  PainSc:          Complications: No notable events documented.

## 2021-06-17 NOTE — Progress Notes (Signed)
Respiratory culture collected, sent to lab.  

## 2021-06-17 NOTE — Progress Notes (Signed)
Bilateral lower extremity venous duplex completed. Refer to "CV Proc" under chart review to view preliminary results.  07/10/2021 10:39 AM Eula Fried., MHA, RVT, RDCS, RDMS

## 2021-06-17 NOTE — Procedures (Signed)
Arterial Catheter Insertion Procedure Note  Denham Mose  [id number]  1965/12/24  Date:07/04/2021  Time:10:35 AM    Provider Performing: Dewain Penning T    Procedure: Insertion of Arterial Line (97026) without US guidance  Indication(s) Blood pressure monitoring and/or need for frequent ABGs  Consent Unable to obtain consent due to emergent nature of procedure.  Anesthesia None   Time Out Verified patient identification, verified procedure, site/side was marked, verified correct patient position, special equipment/implants available, medications/allergies/relevant history reviewed, required imaging and test results available.   Sterile Technique Maximal sterile technique including full sterile barrier drape, hand hygiene, sterile gown, sterile gloves, mask, hair covering, sterile ultrasound probe cover (if used).   Procedure Description Area of catheter insertion was cleaned with chlorhexidine and draped in sterile fashion. Without real-time ultrasound guidance an arterial catheter was placed into the right radial artery.  Appropriate arterial tracings confirmed on monitor.     Complications/Tolerance None; patient tolerated the procedure well.   EBL Minimal   Specimen(s) None

## 2021-06-17 NOTE — Progress Notes (Signed)
Patient transported from cath lab #6 to 2H03 with ECMO Specialist, ECMO Coordinator, perfusionist, respiratory therapist, CRNA, and cath lab tech present. Cardiohelp plugged back in to wall power and sweep gas connected to the wall. Lines secured, site stable. Heater turned back on. No events noted during transport. We arrived at 44.

## 2021-06-17 NOTE — Progress Notes (Signed)
ANTICOAGULATION CONSULT NOTE - Initial Consult  Pharmacy Consult for heparin dosing.  Indication: atrial fibrillation  Allergies  Allergen Reactions   Bactrim [Sulfamethoxazole-Trimethoprim] Hives    Patient Measurements: Height: _0  (175.3 cm) Weight: 112.9 kg (248 lb 14.4 oz) IBW/kg (Calculated) : 70.7 Heparin Dosing Weight: 95.74  Vital Signs: Temp: 99 F (37.2 C) (11/07 0717) Temp Source: Oral (11/07 0717) BP: 122/52 (11/07 0930) Pulse Rate: 102 (11/07 1000)  Labs: Recent Labs    06/16/21 0308 06/16/21 1100 06/16/21 1231 06/16/2021 0257 07/01/2021 0408 07/02/2021 1031  HGB 12.2*  --    < > 12.4* 12.9* 13.6  HCT 40.6  --    < > 43.3 38.0* 40.0  PLT 176  --   --  157  --   --   CREATININE 1.68* 1.69*  --  1.64*  --   --    < > = values in this interval not displayed.    Estimated Creatinine Clearance: 63.1 mL/min (A) (by C-G formula based on SCr of 1.64 mg/dL (H)).   Medical History: Past Medical History:  Diagnosis Date   Anxiety    Atrial fibrillation (HCC)    CHF (congestive heart failure) (HCC)    COPD (chronic obstructive pulmonary disease) (HCC)    Emphysema [[name]]   Depression    Diabetes mellitus without complication ([place])    type 2   Emphysema of lung (HCC)    GERD (gastroesophageal reflux disease)    HIV disease ([place])    Hypertension    Oxygen deficiency     Medications:  Scheduled:   albuterol  5 mg Nebulization Once   budesonide (PULMICORT) nebulizer solution  0.25 mg Nebulization BID   chlorhexidine gluconate (MEDLINE KIT)  15 mL Mouth Rinse BID   Chlorhexidine Gluconate Cloth  6 each Topical Daily   emtricitabine-tenofovir AF  1 tablet Per Tube Daily   And   dolutegravir  50 mg Per Tube Daily   feeding supplement (PROSource TF)  45 mL Per Tube Daily   insulin aspart  0-20 Units Subcutaneous Q4H   insulin aspart  3 Units Subcutaneous Q4H   insulin glargine-yfgn  18 Units Subcutaneous Daily   ipratropium-albuterol  3 mL Nebulization  Q6H   mouth rinse  15 mL Mouth Rinse 10 times per day   nicotine  14 mg Transdermal Daily   oseltamivir  75 mg Per Tube BID   pantoprazole (PROTONIX) IV  40 mg Intravenous Daily   polyethylene glycol  17 g Per Tube Daily   [START ON 06/20/2021] predniSONE  10 mg Per Tube Q breakfast   [START ON 06/18/2021] predniSONE  20 mg Per Tube Q breakfast   rosuvastatin  20 mg Per Tube Daily   senna-docusate  1 tablet Per Tube BID   sodium chloride flush  3 mL Intravenous Q12H   vecuronium  10 mg Intravenous Once   Infusions:   sodium chloride     sodium chloride 10 mL/hr at 07/01/2021 1000   sodium chloride     amiodarone 30 mg/hr (06/12/2021 1000)   ampicillin-sulbactam (UNASYN) IV Stopped (06/16/2021 0646)   dextrose Stopped (06/16/21 2130)   feeding supplement (VITAL HIGH PROTEIN) Stopped (06/16/21 2000)   fentaNYL infusion INTRAVENOUS 150 mcg/hr (07/01/2021 1000)   propofol (DIPRIVAN) infusion 50 mcg/kg/min (06/12/2021 1000)   vancomycin 2,500 mg (06/21/2021 1020)    Assessment: 55 Y/O male on chronic apixaban for atrial fibrillation. Transitioning to heparin in preparation for line placement and possible procedures. Baseline  aPTT/heparin levels pending. Last apixaban dose given _0  11/7. Platelets down from baseline but WNL. H&H stable.   Goal of Therapy:  aPTT 66-102 seconds. Monitor platelets by anticoagulation protocol: Yes   Plan:  Order baseline aPTT and heparin levels. Start heparin infusion at 1400 units/hr _1 . Continue to monitor aPTT, heparin levels, and platelets while on IV heparin. Check initial heparin level and aPTT 6 hours after infusion.   Caren Macadam 07/08/2021,10:40 AM

## 2021-06-17 NOTE — Procedures (Signed)
Bronchoscopy Procedure Note  Arvle Grabe  [id number]  04/07/1966  Date:06/28/2021  Time:12:03 AM   Provider Performing:Jaline Pincock   Procedure(s):  Flexible Bronchoscopy [phone])  Indication(s) Hypoxemia, aspiration  Consent Risks of the procedure as well as the alternatives and risks of each were explained to the patient and/or caregiver.  Consent for the procedure was obtained and is signed in the bedside chart  Anesthesia Versed 18m    Time Out Verified patient identification, verified procedure, site/side was marked, verified correct patient position, special equipment/implants available, medications/allergies/relevant history reviewed, required imaging and test results available.   Sterile Technique Usual hand hygiene, masks, gowns, and gloves were used   Procedure Description Bronchoscope advanced through endotracheal tube and into airway.  Airways were examined down to subsegmental level with findings noted below.    Finding: Only found moderately erythematous and friable mucosa, no mucous plugs.    Complications/Tolerance Tachycardia, hypoxia (was 92, went down to 89 during procedure, stopped.  Chest X-ray is not needed post procedure.   EBL none   Specimen(s)

## 2021-06-17 NOTE — Progress Notes (Signed)
eLink Physician-Brief Progress Note Patient Name: Chris [name]. DOB: [birth year] MRN: [id number]   Date of Service  23-Jun-2021  HPI/Events of Note  Hypoxemia - Sat dropped into 70's transiently. Now = 86%. CXR post bronchoscopy reveals improved aeration of R base. Appears well sedated on Fentanyl and Propofol IV infusions.  eICU Interventions  Plan: Will empirically try NMB with Vecuronium X 1 to see if there is any improvement in O2 saturation.      Intervention Category Major Interventions: Hypoxemia - evaluation and management  [name] 2021-06-23, 3:46 AM

## 2021-06-17 NOTE — Procedures (Addendum)
Central Venous Catheter Insertion Procedure Note  Chris [name]  [id number]  1966/06/27  Date:06/16/2021  Time:6:41 PM   Provider Performing:Ailie Gage   Procedure: Insertion of Non-tunneled Central Venous [phone]) with US guidance (70263)   Indication(s) Difficult access  Consent Risks of the procedure as well as the alternatives and risks of each were explained to the patient and/or caregiver.  Consent for the procedure was obtained and is signed in the bedside chart  Anesthesia Systemic sedation  Timeout Verified patient identification, verified procedure, site/side was marked, verified correct patient position, special equipment/implants available, medications/allergies/relevant history reviewed, required imaging and test results available.  Sterile Technique Maximal sterile technique including full sterile barrier drape, hand hygiene, sterile gown, sterile gloves, mask, hair covering, sterile ultrasound probe cover (if used).  Procedure Description Area of catheter insertion was cleaned with chlorhexidine and draped in sterile fashion.  With real-time ultrasound guidance a 76F 20cm triple lumen catheter central venous catheter was placed into the left internal jugular vein. Nonpulsatile blood flow and easy flushing noted in all ports.  The catheter was sutured in place and sterile dressing applied.  Complications/Tolerance None; patient tolerated the procedure well. Placement verified fluoroscopically  EBL Minimal  Specimen(s) None  [name], MD [facility] ICU Physician [facility] [place]  Pager: [phone] Or Epic Secure Chat After hours: [phone].  06/22/2021, 6:46 PM

## 2021-06-17 NOTE — Progress Notes (Signed)
Pharmacy Antibiotic Note  Chris [name] Chris [name] Chris [name]. is a 55 y.o. male admitted on 26-Jun-2021 with pneumonia.  Pharmacy has been consulted for vancomycin dosing. Pt. Presenting with influenza, sputum culture still pending, but growing gram + cocci. Pt is on mechanical ventilation with maximum requirements. White blood cell counts and fever are improving. Initiating empiric MRSA coverage until culture results finalize.   Plan: Gave Vancomycin 2500 mg IV loading dose.Continue maintenance dose of Vancomycin 1250 mg  IV every 24 hours.  Goal trough 15-20 mcg/mL. Check levels after 4 doses. Plan for 7 days of therapy. De-escalate based on cultures/clinical improvement as necessary. Patient currently being evaluated for ECMO, will follow up on the implementation and duration of this plan to adjust dosing.   Height: 5\' 9"  (175.3 cm) Weight: 112.9 kg (248 lb 14.4 oz) IBW/kg (Calculated) : 70.7  Temp (24hrs), Avg:99.1 F (37.3 C), Min:98.6 F (37 C), Max:99.6 F (37.6 C)  Recent Labs  Lab Jun 26, 2021 0412 2021/06/26 0420 06/14/21 0314 06/14/21 1035 06/15/21 1304 06/15/21 2121 06/16/21 0308 06/16/21 1100 06/28/2021 0257  WBC 13.7*  --  13.0*  --   --   --  11.2*  --  7.9  CREATININE  --    < > 1.95*   < > 1.33* 1.61* 1.68* 1.69* 1.64*   < > = values in this interval not displayed.    Estimated Creatinine Clearance: 63.1 mL/min (A) (by C-G formula based on SCr of 1.64 mg/dL (H)).    Allergies  Allergen Reactions   Bactrim [Sulfamethoxazole-Trimethoprim] Hives    Antimicrobials this admission: Oseltamivir 11/3>>> [11/7]  Microbiology results: 11/7 Sputum: IP, (gram + cocci)  11/3 MRSA PCR: Neg  Thank you for allowing pharmacy to be a part of this patient's care.  13/3, PharmD Candidate, P4 07/08/2021 10:35 AM

## 2021-06-17 NOTE — Progress Notes (Addendum)
NAME:  Chris Hettinger., MRN:  [id number], DOB:  1966/08/03, LOS: 4 ADMISSION DATE:  06/16/2021, CONSULTATION DATE:  06/30/2021 REFERRING MD:  Tera Helper, CHIEF COMPLAINT:  respiratory distress   History of Present Illness:  [name] a 55 year old male with PMHx of chronic hypoxic respiratory failure 2/2 COPD on 2L oxygen at baseline, Afib on Eliquis, HFpEF, hypertension, type 2 diabetes mellitus, ARDS 2/2 influenza A requiring intubation and subsequent tracheostomy presenting with three days of fever, cough, and dsypnea in setting of sick contact. He notes that his grandson has been sick. He had his COVID vaccine booster earlier this week. He reports shortness of breath is worse with activity and lying flat. He also endorses decreased oral intake. Does endorse palpitations but denies any chest pain. Also has had nausea and vomiting at home. Patient initially  noted to be in Afib with RVR on presentation to the ED for which he was started on cardizem infusion and placed on BiPAP for respiratory distress and hypoxia. He was noted to be influenza positive. Patient admitted to hospitalist service. Patient with worsening respiratory status and concern for increased oral secretions for which PCCM consulted.   Pertinent  Medical History   Past Medical History:  Diagnosis Date   Anxiety    Atrial fibrillation (HCC)    CHF (congestive heart failure) (HCC)    COPD (chronic obstructive pulmonary disease) ([place])    Emphysema [[name]]   Depression    Diabetes mellitus without complication ([place])    type 2   Emphysema of lung ([place])    GERD (gastroesophageal reflux disease)    HIV disease ([place])    Hypertension    Oxygen deficiency      Significant Hospital Events: Including procedures, antibiotic start and stop dates in addition to other pertinent events   11/3> admitted to hospitalist service with influenza A; transferred to ICU for acute on chronic hypoxic respiratory failure;  intubated 11/4 extubated, maintained on bipap overnight, reintubated this morning 11/6 continued to spike fevers; back in Afib with RVR for which started on amiodarone gtt  Interim History / Subjective:  Overnight, worsening hypoxia and aspiration event. Bedside bronchoscopy with friable mucosa without mucous plugging. Continued to have hypoxemia with sats dropping into 70's transiently. Attempted NMB without significant improvement.  This morning, continued hypoxia in the 80's.   Objective   Blood pressure 113/67, pulse (!) 115, temperature 99.4 F (37.4 C), temperature source Axillary, resp. rate (!) 26, height 5\' 9"  (1.753 m), weight 112.9 kg, SpO2 (!) 89 %.    Vent Mode: PRVC FiO2 (%):  [55 %-100 %] 100 % Set Rate:  [26 bmp] 26 bmp Vt Set:  [480 mL] 480 mL PEEP:  [8 cmH20-10 cmH20] 10 cmH20 Plateau Pressure:  [20 cmH20-22 cmH20] 22 cmH20   Intake/Output Summary (Last 24 hours) at 06/23/2021 0708 Last data filed at 06/27/2021 [id number] Gross per 24 hour  Intake 2451.67 ml  Output 1795 ml  Net 656.67 ml   Filed Weights   06/14/21 0340 06/16/21 0500 07/10/2021 0500  Weight: 108 kg 112.9 kg 112.9 kg    Examination: General: acutely ill appearing, intubated, sedated HENT: ETT to vent Lungs:  diffuse rhonchi on anterior auscultation; hypoxic to 80s on full vent support: FiO2 100%, PEEP 10 Cardiovascular: iirregularly irregular, HR in 130s Abdomen: obese, soft, +BS Extremities: warm and dry, trace pitting edema of bilat lower extremities Neuro: sedated. RASS -3 Skin: No rash  Resolved Hospital Problem  list    Assessment & Plan:  Acute on chronic hypoxic and hypercarbic respiratory failure 2/2 Influenza A pneumonia COPD exacerbation History of ARDS secondary to Influenza A s/p tracheostomy in 2019 Worsening hypoxia although CXR with improved aeration of right lung base with some residual opacity. Tracheal aspirate cultures with gram +ve cocci. -Continue full ventilator support.  Closely monitor I:E ratio and auto-PEEP. Will allow permissive hypercapnia with a goal pH>7.2. titrate down vent settings as tolerated -Continue tamiflu 30 mg twice daily for total 5 days.  -Continue steroid taper  -Continue scheduled DuoNebs every 6 hours + Pulmicort twice daily+ albuterol every 4 hours as needed -Guaifenesin 10 mL per tube every 6 hours - BLE DVT US  - Consult to ECMO team   Atrial fibrillation with RVR- CHADS2VASc score 3 (HTN, CHF, diabetes). Patient is on diltiazem 240mg  daily and Eliquis 5mg  daily at home. Currently on amiodarone gtt with HR in 130s. Likely in setting of hypoxia as above.  -Continue amiodarone gtt; bolus prn  -Continue Eliquis 5 mg twice daily -Continue cardiac monitor  Acute metabolic toxic encephalopathy Likely in setting of acute restaurant failure as above.  Patient is on fentanyl and propofol gtt.  - RASS goal should be 0 to -2 and he is currently -3. -Continue fentanyl gtt, propofol gtt  -Continue fentanyl and Versed as needed to maintain RASS  AKI on CKDII Hyperkalemia Baseline sCr ~1.2 curently 1.6 with adequate uop -Trend renal function -Avoid nephrotoxic agents - repeat BMP pending in am  Hx of HFpEF Currently euvolemic. Holding lasix for now -Continue to monitor volume status  Type II DM CBG's improved with increasing basal insulin. Currently at goal.  - Continue  Semglee 18U daily. Will need to watch closely as prednisone taper continues.  - Continue SSI resistant q4h  - Continue CBG monitoring q4h  HIV, well controlled -On Biktarvy daily at home. -Currently on Descovy due to renal function  Hypertension Hyperlipidemia Holding home antihypertensive in setting of intermittent hypotension requiring vasopressor support  - Continue rosuvastatin 20mg  daily  Macrocytic anemia Anemia of critical illness No acute signs of bleed at this time. Continue to trend CBC. Goal Hb >7   Metabolic alkalosis Has had some increased NG  tube output over the past several days. No bowel movements - concern for possible ileus for which KUB obtained - negative.  - Continue to trend BMP  Best Practice (right click and "Reselect all SmartList Selections" daily)   Diet/type: tubefeeds increasing bowel regimen today.  DVT prophylaxis: DOAC GI prophylaxis: PPI Lines: N/A Foley:  Yes, and it is still needed Code Status:  full code Last date of multidisciplinary goals of care discussion [updated wife 11/7]  Labs   CBC: Recent Labs  Lab 07/10/2021 0412 06/14/2021 0420 06/14/21 0314 06/15/21 0513 06/15/21 0842 06/16/21 0308 06/16/21 1231 06/24/2021 0257 07/09/2021 0408  WBC 13.7*  --  13.0*  --   --  11.2*  --  7.9  --   NEUTROABS 9.8*  --   --   --   --   --   --  6.3  --   HGB 14.1   < > 12.7*   < > 13.3 12.2* 12.9* 12.4* 12.9*  HCT 45.7   < > 41.7   < > 39.0 40.6 38.0* 43.3 38.0*  MCV 93.8  --  95.6  --   --  96.9  --  100.5*  --   PLT 207  --  186  --   --  176  --  157  --    < > = values in this interval not displayed.    Basic Metabolic Panel: Recent Labs  Lab 06/15/21 1304 06/15/21 2121 06/16/21 0308 06/16/21 1100 06/16/21 1231 06/16/21 2214 06/20/2021 0257 07/03/2021 0408  NA 135 129* 139 140 141  --  143 140  K 6.0* 5.7* 5.5* 5.4* 5.3*  --  5.1 4.2  CL 99 90* 99 100  --   --  102  --   CO2 30 29 35* 33*  --   --  33*  --   GLUCOSE 236* 335* 236* 284*  --   --  149*  --   BUN 52* 56* 54* 56*  --   --  66*  --   CREATININE 1.33* 1.61* 1.68* 1.69*  --   --  1.64*  --   CALCIUM 8.6* 8.0* 9.0 9.1  --   --  9.1  --   MG  --   --  2.9*  --   --  3.4*  --   --    GFR: Estimated Creatinine Clearance: 63.1 mL/min (A) (by C-G formula based on SCr of 1.64 mg/dL (H)). Recent Labs  Lab 06/21/2021 0412 06/14/21 0314 06/16/21 0308 06/16/2021 0257  WBC 13.7* 13.0* 11.2* 7.9    Liver Function Tests: Recent Labs  Lab 06/10/21 1029 06/14/21 0314  AST 17 23  ALT 19 22  ALKPHOS  --  45  BILITOT 0.3 0.6  PROT 7.8  7.7  ALBUMIN  --  3.2*   No results for input(s): LIPASE, AMYLASE in the last 168 hours. No results for input(s): AMMONIA in the last 168 hours.  ABG    Component Value Date/Time   PHART 7.380 07/09/2021 0408   PCO2ART 70.3 (HH) 06/23/2021 0408   PO2ART 68 (L) 07/04/2021 0408   HCO3 41.4 (H) 06/13/2021 0408   TCO2 43 (H) 06/15/2021 0408   ACIDBASEDEF 3.2 (H) 12/14/2017 2135   O2SAT 91.0 06/21/2021 0408     Coagulation Profile: No results for input(s): INR, PROTIME in the last 168 hours.  Cardiac Enzymes: No results for input(s): CKTOTAL, CKMB, CKMBINDEX, TROPONINI in the last 168 hours.  HbA1C: HbA1c, POC (controlled diabetic range)  Date/Time Value Ref Range Status  04/05/2021 02:09 PM 7.8 (A) 0.0 - 7.0 % Final  04/20/2020 11:13 AM 8.2 (A) 0.0 - 7.0 % Final   Hgb A1c MFr Bld  Date/Time Value Ref Range Status  07/01/2021 04:12 AM 6.8 (H) 4.8 - 5.6 % Final    Comment:    (NOTE) Pre diabetes:          5.7%-6.4%  Diabetes:              >6.4%  Glycemic control for   <7.0% adults with diabetes   10/30/2020 02:25 AM 7.3 (H) 4.8 - 5.6 % Final    Comment:    (NOTE) Pre diabetes:          5.7%-6.4%  Diabetes:              >6.4%  Glycemic control for   <7.0% adults with diabetes     CBG: Recent Labs  Lab 06/16/21 2147 06/16/21 2314 06/24/2021 0121 07/07/2021 0329 06/24/2021 0538  GLUCAP 269* 207* 178* 119* 120*    Critical care time: 35 minutes    Harvie Heck, MD Internal Medicine, PGY-3 06/15/2021 7:08 AM Pager # [phone] If no response to pager , please call critical care on  call (see AMION) until 7pm After 7:00 pm call Elink
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
## 2021-06-17 NOTE — Consult Note (Signed)
   [facility] CM Inpatient Consult   06/21/2021  Chris Venuto Knipp Jr. [birth year] [id number]  [facility] [[facility]]  Accountable Care Organization [ACO] Patient: [name] [facility]   Patient is currently active with [facility] [[facility]] Care Management for chronic disease management services.  Patient has been engaged by a [facility] Geriatric NP.  Our community based plan of care has focused on disease management and community resource support.    *Patient is currently in ICU on vent and this is acknowledging his hospital admission.  Plan:  Follow for progression as appropriate.  For questions, please contact:  Charlesetta Shanks, RN BSN CCM [facility]  [phone] business mobile phone Toll free office [phone]  Fax number: [phone] [email or web address]

## 2021-06-17 NOTE — Procedures (Addendum)
Central Venous Catheter Insertion Procedure Note  Kiel Cockerell  [id number]  1966/02/07  Date:06/16/2021  Time:2:07 PM   Provider Performing:Jace Dowe   Procedure: Insertion of Non-tunneled Central Venous [phone]) with US guidance (01751)   Indication(s) Difficult access  Consent Risks of the procedure as well as the alternatives and risks of each were explained to the patient and/or caregiver.  Consent for the procedure was obtained and is signed in the bedside chart  Anesthesia None on continuous sedation and paralytic  Timeout Verified patient identification, verified procedure, site/side was marked, verified correct patient position, special equipment/implants available, medications/allergies/relevant history reviewed, required imaging and test results available.  Sterile Technique Maximal sterile technique including full sterile barrier drape, hand hygiene, sterile gown, sterile gloves, mask, hair covering, sterile ultrasound probe cover (if used).  Procedure Description Area of catheter insertion was cleaned with chlorhexidine and draped in sterile fashion.  With real-time ultrasound guidance an 14F introducer sheath was placed into the right internal jugular vein. Nonpulsatile blood flow and easy flushing noted in all ports.  The catheter was sutured in place and sterile dressing applied.     Complications/Tolerance None; patient tolerated the procedure well. Chest X-ray is ordered to verify placement for internal jugular or subclavian cannulation.   Chest x-ray is not ordered for femoral cannulation.  EBL Minimal  Specimen(s) None  [name], MD [facility] ICU Physician [facility] Critical [facility]  Pager: [phone] Or Epic Secure Chat After hours: [phone].  07/06/2021, 2:09 PM

## 2021-06-17 NOTE — Consult Note (Signed)
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 Limited visualization PTV and peroneal veins.  +---------+---------------+---------+-----------+----------+--------------+ LEFT     CompressibilityPhasicitySpontaneityPropertiesThrombus Aging +---------+---------------+---------+-----------+----------+--------------+ CFV      Full           Yes      Yes                                 +---------+---------------+---------+-----------+----------+--------------+ SFJ      Full                                                        +---------+---------------+---------+-----------+----------+--------------+ FV Prox  Full                                                         +---------+---------------+---------+-----------+----------+--------------+  FV Mid   Full                                                        +---------+---------------+---------+-----------+----------+--------------+ FV DistalFull                                                        +---------+---------------+---------+-----------+----------+--------------+ PFV      Full                                                        +---------+---------------+---------+-----------+----------+--------------+ POP      Full           Yes      Yes                                 +---------+---------------+---------+-----------+----------+--------------+ PTV      Full                                                        +---------+---------------+---------+-----------+----------+--------------+ PERO     Full                                                        +---------+---------------+---------+-----------+----------+--------------+   Left Technical Findings: Not visualized segments include Limited visualization PTV and peroneal veins.   Summary: RIGHT: - There is no evidence of deep vein thrombosis in the lower extremity. However, portions of this examination were limited- see technologist comments above.  - No cystic structure found in the popliteal fossa.  LEFT: - There is no evidence of deep vein thrombosis in the lower extremity. However, portions of this examination were limited- see technologist comments above.  - No cystic structure found in the popliteal fossa.  *See table(s) above for measurements and observations. Electronically signed by Servando Snare MD on 06/21/2021 at 2:03:46 PM.    Final     Review of Systems  Unable to perform ROS: Intubated  Blood pressure (!) 92/47, pulse 93, temperature 98.2 F (36.8 C), temperature source Oral, resp. rate (!) 26, height 5\' 9"  (1.753 m), weight 112.9 kg, SpO2 (!)  81 %. Physical Exam Constitutional:      Appearance: He is obese.     Interventions: He is sedated, chemically paralyzed and intubated.  HENT:     Head:     Comments: OG tube, orally intubated. Neck:     Thyroid: No thyroid mass.     Trachea: Trachea normal. No tracheal deviation.  Cardiovascular:     Heart sounds: Normal heart sounds.  Pulmonary:     Effort: He is  intubated.     Comments: No wheezing.  Vesicular breath sounds throughout.  Some bronchial breath sounds at bases.  Plateau pressures 29, peak pressures 39.  No significant auto PEEP.  Prolonged expiratory phase.  Currently on 7 cc/kg. Abdominal:     General: Abdomen is protuberant.     Palpations: Abdomen is soft.  Genitourinary:    Comments: Foley catheter in place clear urine. Neurological:     Mental Status: He is unresponsive.     GCS: GCS eye subscore is 1. GCS verbal subscore is 1. GCS motor subscore is 1.     Comments: Sedated and paralyzed.  Psychiatric:        Behavior: Behavior is uncooperative.    Assessment/Plan:  Critically ill due to acute on chronic hypoxic and hypercapnic respiratory failure from influenza pneumonia on background of severe COPD.  Unable to provide adequate gas exchange with lung protective ventilation and and spite of chemical paralysis. Critically ill due distributive shock requiring titration of norepinephrine to keep MAP greater than 65 Critically ill due to rapid atrial fibrillation requiring titration of amiodarone Stage III COPD HIV disease well-controlled on ART Undiagnosed OSA. GERD History of diastolic heart failure Type 2 diabetes  At this point patient cannot achieve acceptable gas exchange with mechanical ventilation.  Acceptable ECMO risk based on RESP score.  However underlying lung disease complicates decision making.  While on the surface, is lung disease is quite advanced, he has survived with essentially this level of lung disease for the past 10 years and has  survived and returned to baseline following a similar episode of ARDS 3 years ago.  I think there is a reasonable chance at this point to get him back to his prior level of function.  Explained ECMO and the goals of support, namely to bridge to lung recovery, to the wife.  She understands that recovery is not guaranteed and may not be complete and may result in less of a quality of life that he had before.  She also understands that ECMO would be a time-limited intervention with the duration based on seeing progress.  She has consented for an attempt at ECMO salvage.  Arrangements have been made for VV ECMO cannulation.  CRITICAL CARE Performed by: Kipp Brood   Total critical care time: 75 minutes  Critical care time was exclusive of separately billable procedures and treating other patients.  Critical care was necessary to treat or prevent imminent or life-threatening deterioration.  Critical care was time spent personally by me on the following activities: development of treatment plan with patient and/or surrogate as well as nursing, discussions with consultants, evaluation of patient's response to treatment, examination of patient, obtaining history from patient or surrogate, ordering and performing treatments and interventions, ordering and review of laboratory studies, ordering and review of radiographic studies, pulse oximetry, re-evaluation of patient's condition and participation in multidisciplinary rounds.  Kipp Brood, MD [facility] ICU Physician [place]  Pager: [phone] Mobile: [phone] After hours: [phone].    Kipp Brood Date: 06/21/2021 Time: 2:11 PM

## 2021-06-17 NOTE — Procedures (Signed)
Extracorporeal support note   ECLS support day: 0 Indication: hypoxic and hypercapnic respiratory failure   Configuration: VV-ECMO  Drainage cannula: right IJ 20F [place]  Return cannula: right IJ 20F [place]  Pump speed: 3300  Pump flow: 4.5lpm Pump used: Museum/gallery conservator: Cardiohelp O2 blender: 100 Sweep gas: 5lpm  Circuit check: initiation - no clots  Anticoagulant: 10000 units heparin with ACT 210 prior to cannulation.  Anticoagulation targets: bivalirudin with PTT goal 60-80  Changes in support: initiation   Anticipated goals/duration of support: bridge to recovery with anticipated duration of 7 days.   [name], MD [facility] ICU Physician [facility]  Pager: [phone] Mobile: [phone] After hours: [phone].  06/18/2021, 5:55 PM

## 2021-06-17 NOTE — Progress Notes (Signed)
eLink Physician-Brief Progress Note Patient Name: Shaquel Chavous. DOB: [birth year] MRN: [id number]   Date of Service  06/11/2021  HPI/Events of Note  AFIB with RVR - Ventricular rate = 140. Currently on Amiodarone IV infusion. K+ = 5.1 and Mg++ = 3.4.  eICU Interventions  Plan: Bolus with Amiodarone 150 mg IV over 10 minutes now.      Intervention Category Major Interventions: Arrhythmia - evaluation and management  Avigail Pilling [place] 07/09/2021, 4:54 AM

## 2021-06-17 NOTE — Progress Notes (Signed)
1800  Transported pt to ICU without Incidence.

## 2021-06-18 ENCOUNTER — Inpatient Hospital Stay (HOSPITAL_COMMUNITY): Payer: Medicare HMO

## 2021-06-18 ENCOUNTER — Encounter (HOSPITAL_COMMUNITY): Payer: Self-pay | Admitting: Cardiology

## 2021-06-18 DIAGNOSIS — J9602 Acute respiratory failure with hypercapnia: Secondary | ICD-10-CM | POA: Diagnosis not present

## 2021-06-18 DIAGNOSIS — Z7189 Other specified counseling: Secondary | ICD-10-CM

## 2021-06-18 DIAGNOSIS — J9601 Acute respiratory failure with hypoxia: Secondary | ICD-10-CM

## 2021-06-18 DIAGNOSIS — Z515 Encounter for palliative care: Secondary | ICD-10-CM

## 2021-06-18 DIAGNOSIS — J101 Influenza due to other identified influenza virus with other respiratory manifestations: Secondary | ICD-10-CM | POA: Diagnosis not present

## 2021-06-18 LAB — POCT I-STAT EG7
Acid-Base Excess: 9 mmol/L — ABNORMAL HIGH (ref 0.0–2.0)
Bicarbonate: 35.2 mmol/L — ABNORMAL HIGH (ref 20.0–28.0)
Calcium, Ion: 1.05 mmol/L — ABNORMAL LOW (ref 1.15–1.40)
HCT: 28 % — ABNORMAL LOW (ref 39.0–52.0)
Hemoglobin: 9.5 g/dL — ABNORMAL LOW (ref 13.0–17.0)
O2 Saturation: 48 %
Patient temperature: 36.7
Potassium: 4.8 mmol/L (ref 3.5–5.1)
Sodium: 140 mmol/L (ref 135–145)
TCO2: 37 mmol/L — ABNORMAL HIGH (ref 22–32)
pCO2, Ven: 59.6 mmHg (ref 44.0–60.0)
pH, Ven: 7.378 (ref 7.250–7.430)
pO2, Ven: 27 mmHg — CL (ref 32.0–45.0)

## 2021-06-18 LAB — POCT I-STAT 7, (LYTES, BLD GAS, ICA,H+H)
Acid-Base Excess: 10 mmol/L — ABNORMAL HIGH (ref 0.0–2.0)
Acid-Base Excess: 10 mmol/L — ABNORMAL HIGH (ref 0.0–2.0)
Acid-Base Excess: 11 mmol/L — ABNORMAL HIGH (ref 0.0–2.0)
Acid-Base Excess: 8 mmol/L — ABNORMAL HIGH (ref 0.0–2.0)
Acid-Base Excess: 8 mmol/L — ABNORMAL HIGH (ref 0.0–2.0)
Acid-Base Excess: 9 mmol/L — ABNORMAL HIGH (ref 0.0–2.0)
Acid-Base Excess: 9 mmol/L — ABNORMAL HIGH (ref 0.0–2.0)
Acid-Base Excess: 9 mmol/L — ABNORMAL HIGH (ref 0.0–2.0)
Bicarbonate: 33.2 mmol/L — ABNORMAL HIGH (ref 20.0–28.0)
Bicarbonate: 35.9 mmol/L — ABNORMAL HIGH (ref 20.0–28.0)
Bicarbonate: 36 mmol/L — ABNORMAL HIGH (ref 20.0–28.0)
Bicarbonate: 36.8 mmol/L — ABNORMAL HIGH (ref 20.0–28.0)
Bicarbonate: 36.9 mmol/L — ABNORMAL HIGH (ref 20.0–28.0)
Bicarbonate: 37 mmol/L — ABNORMAL HIGH (ref 20.0–28.0)
Bicarbonate: 37.3 mmol/L — ABNORMAL HIGH (ref 20.0–28.0)
Bicarbonate: 37.8 mmol/L — ABNORMAL HIGH (ref 20.0–28.0)
Calcium, Ion: 1.02 mmol/L — ABNORMAL LOW (ref 1.15–1.40)
Calcium, Ion: 1.06 mmol/L — ABNORMAL LOW (ref 1.15–1.40)
Calcium, Ion: 1.1 mmol/L — ABNORMAL LOW (ref 1.15–1.40)
Calcium, Ion: 1.11 mmol/L — ABNORMAL LOW (ref 1.15–1.40)
Calcium, Ion: 1.12 mmol/L — ABNORMAL LOW (ref 1.15–1.40)
Calcium, Ion: 1.12 mmol/L — ABNORMAL LOW (ref 1.15–1.40)
Calcium, Ion: 1.13 mmol/L — ABNORMAL LOW (ref 1.15–1.40)
Calcium, Ion: 1.15 mmol/L (ref 1.15–1.40)
HCT: 26 % — ABNORMAL LOW (ref 39.0–52.0)
HCT: 27 % — ABNORMAL LOW (ref 39.0–52.0)
HCT: 27 % — ABNORMAL LOW (ref 39.0–52.0)
HCT: 27 % — ABNORMAL LOW (ref 39.0–52.0)
HCT: 28 % — ABNORMAL LOW (ref 39.0–52.0)
HCT: 28 % — ABNORMAL LOW (ref 39.0–52.0)
HCT: 30 % — ABNORMAL LOW (ref 39.0–52.0)
HCT: 30 % — ABNORMAL LOW (ref 39.0–52.0)
Hemoglobin: 10.2 g/dL — ABNORMAL LOW (ref 13.0–17.0)
Hemoglobin: 10.2 g/dL — ABNORMAL LOW (ref 13.0–17.0)
Hemoglobin: 8.8 g/dL — ABNORMAL LOW (ref 13.0–17.0)
Hemoglobin: 9.2 g/dL — ABNORMAL LOW (ref 13.0–17.0)
Hemoglobin: 9.2 g/dL — ABNORMAL LOW (ref 13.0–17.0)
Hemoglobin: 9.2 g/dL — ABNORMAL LOW (ref 13.0–17.0)
Hemoglobin: 9.5 g/dL — ABNORMAL LOW (ref 13.0–17.0)
Hemoglobin: 9.5 g/dL — ABNORMAL LOW (ref 13.0–17.0)
O2 Saturation: 86 %
O2 Saturation: 89 %
O2 Saturation: 89 %
O2 Saturation: 89 %
O2 Saturation: 91 %
O2 Saturation: 93 %
O2 Saturation: 94 %
O2 Saturation: 99 %
Patient temperature: 36.7
Patient temperature: 36.7
Patient temperature: 36.7
Patient temperature: 36.7
Patient temperature: 36.8
Patient temperature: 36.8
Patient temperature: 36.8
Patient temperature: 36.9
Potassium: 4.4 mmol/L (ref 3.5–5.1)
Potassium: 4.6 mmol/L (ref 3.5–5.1)
Potassium: 4.7 mmol/L (ref 3.5–5.1)
Potassium: 4.8 mmol/L (ref 3.5–5.1)
Potassium: 4.8 mmol/L (ref 3.5–5.1)
Potassium: 4.9 mmol/L (ref 3.5–5.1)
Potassium: 5 mmol/L (ref 3.5–5.1)
Potassium: 5.1 mmol/L (ref 3.5–5.1)
Sodium: 139 mmol/L (ref 135–145)
Sodium: 140 mmol/L (ref 135–145)
Sodium: 141 mmol/L (ref 135–145)
Sodium: 141 mmol/L (ref 135–145)
Sodium: 142 mmol/L (ref 135–145)
Sodium: 142 mmol/L (ref 135–145)
Sodium: 145 mmol/L (ref 135–145)
Sodium: 145 mmol/L (ref 135–145)
TCO2: 35 mmol/L — ABNORMAL HIGH (ref 22–32)
TCO2: 38 mmol/L — ABNORMAL HIGH (ref 22–32)
TCO2: 38 mmol/L — ABNORMAL HIGH (ref 22–32)
TCO2: 39 mmol/L — ABNORMAL HIGH (ref 22–32)
TCO2: 39 mmol/L — ABNORMAL HIGH (ref 22–32)
TCO2: 39 mmol/L — ABNORMAL HIGH (ref 22–32)
TCO2: 39 mmol/L — ABNORMAL HIGH (ref 22–32)
TCO2: 40 mmol/L — ABNORMAL HIGH (ref 22–32)
pCO2 arterial: 46.4 mmHg (ref 32.0–48.0)
pCO2 arterial: 56.4 mmHg — ABNORMAL HIGH (ref 32.0–48.0)
pCO2 arterial: 58.8 mmHg — ABNORMAL HIGH (ref 32.0–48.0)
pCO2 arterial: 65.9 mmHg (ref 32.0–48.0)
pCO2 arterial: 66.6 mmHg (ref 32.0–48.0)
pCO2 arterial: 69.1 mmHg (ref 32.0–48.0)
pCO2 arterial: 70.2 mmHg (ref 32.0–48.0)
pCO2 arterial: 76.9 mmHg (ref 32.0–48.0)
pH, Arterial: 7.289 — ABNORMAL LOW (ref 7.350–7.450)
pH, Arterial: 7.327 — ABNORMAL LOW (ref 7.350–7.450)
pH, Arterial: 7.339 — ABNORMAL LOW (ref 7.350–7.450)
pH, Arterial: 7.355 (ref 7.350–7.450)
pH, Arterial: 7.362 (ref 7.350–7.450)
pH, Arterial: 7.394 (ref 7.350–7.450)
pH, Arterial: 7.411 (ref 7.350–7.450)
pH, Arterial: 7.461 — ABNORMAL HIGH (ref 7.350–7.450)
pO2, Arterial: 144 mmHg — ABNORMAL HIGH (ref 83.0–108.0)
pO2, Arterial: 57 mmHg — ABNORMAL LOW (ref 83.0–108.0)
pO2, Arterial: 58 mmHg — ABNORMAL LOW (ref 83.0–108.0)
pO2, Arterial: 60 mmHg — ABNORMAL LOW (ref 83.0–108.0)
pO2, Arterial: 61 mmHg — ABNORMAL LOW (ref 83.0–108.0)
pO2, Arterial: 65 mmHg — ABNORMAL LOW (ref 83.0–108.0)
pO2, Arterial: 71 mmHg — ABNORMAL LOW (ref 83.0–108.0)
pO2, Arterial: 77 mmHg — ABNORMAL LOW (ref 83.0–108.0)

## 2021-06-18 LAB — VANCOMYCIN, RANDOM: Vancomycin Rm: 18

## 2021-06-18 LAB — CBC
HCT: 28.2 % — ABNORMAL LOW (ref 39.0–52.0)
HCT: 29.6 % — ABNORMAL LOW (ref 39.0–52.0)
HCT: 32.5 % — ABNORMAL LOW (ref 39.0–52.0)
HCT: 32.8 % — ABNORMAL LOW (ref 39.0–52.0)
Hemoglobin: 8.2 g/dL — ABNORMAL LOW (ref 13.0–17.0)
Hemoglobin: 8.7 g/dL — ABNORMAL LOW (ref 13.0–17.0)
Hemoglobin: 9.6 g/dL — ABNORMAL LOW (ref 13.0–17.0)
Hemoglobin: 9.7 g/dL — ABNORMAL LOW (ref 13.0–17.0)
MCH: 28.9 pg (ref 26.0–34.0)
MCH: 29.3 pg (ref 26.0–34.0)
MCH: 29.5 pg (ref 26.0–34.0)
MCH: 29.6 pg (ref 26.0–34.0)
MCHC: 29.1 g/dL — ABNORMAL LOW (ref 30.0–36.0)
MCHC: 29.4 g/dL — ABNORMAL LOW (ref 30.0–36.0)
MCHC: 29.5 g/dL — ABNORMAL LOW (ref 30.0–36.0)
MCHC: 29.6 g/dL — ABNORMAL LOW (ref 30.0–36.0)
MCV: 100 fL (ref 80.0–100.0)
MCV: 100 fL (ref 80.0–100.0)
MCV: 99.3 fL (ref 80.0–100.0)
MCV: 99.7 fL (ref 80.0–100.0)
Platelets: 163 10*3/uL (ref 150–400)
Platelets: 167 10*3/uL (ref 150–400)
Platelets: 171 10*3/uL (ref 150–400)
Platelets: 182 10*3/uL (ref 150–400)
RBC: 2.84 MIL/uL — ABNORMAL LOW (ref 4.22–5.81)
RBC: 2.97 MIL/uL — ABNORMAL LOW (ref 4.22–5.81)
RBC: 3.25 MIL/uL — ABNORMAL LOW (ref 4.22–5.81)
RBC: 3.28 MIL/uL — ABNORMAL LOW (ref 4.22–5.81)
RDW: 12.3 % (ref 11.5–15.5)
RDW: 12.3 % (ref 11.5–15.5)
RDW: 12.4 % (ref 11.5–15.5)
RDW: 12.4 % (ref 11.5–15.5)
WBC: 13.4 10*3/uL — ABNORMAL HIGH (ref 4.0–10.5)
WBC: 13.9 10*3/uL — ABNORMAL HIGH (ref 4.0–10.5)
WBC: 14 10*3/uL — ABNORMAL HIGH (ref 4.0–10.5)
WBC: 14.1 10*3/uL — ABNORMAL HIGH (ref 4.0–10.5)
nRBC: 0 % (ref 0.0–0.2)
nRBC: 0 % (ref 0.0–0.2)
nRBC: 0.3 % — ABNORMAL HIGH (ref 0.0–0.2)
nRBC: 0.3 % — ABNORMAL HIGH (ref 0.0–0.2)

## 2021-06-18 LAB — PROTIME-INR
INR: 2.1 — ABNORMAL HIGH (ref 0.8–1.2)
Prothrombin Time: 23.1 seconds — ABNORMAL HIGH (ref 11.4–15.2)

## 2021-06-18 LAB — APTT
aPTT: 52 seconds — ABNORMAL HIGH (ref 24–36)
aPTT: 49 seconds — ABNORMAL HIGH (ref 24–36)
aPTT: 51 seconds — ABNORMAL HIGH (ref 24–36)
aPTT: 54 seconds — ABNORMAL HIGH (ref 24–36)
aPTT: 53 seconds — ABNORMAL HIGH (ref 24–36)

## 2021-06-18 LAB — BASIC METABOLIC PANEL
Anion gap: 9 (ref 5–15)
Anion gap: 8 (ref 5–15)
BUN: 85 mg/dL — ABNORMAL HIGH (ref 6–20)
BUN: 103 mg/dL — ABNORMAL HIGH (ref 6–20)
CO2: 34 mmol/L — ABNORMAL HIGH (ref 22–32)
CO2: 34 mmol/L — ABNORMAL HIGH (ref 22–32)
Calcium: 7.8 mg/dL — ABNORMAL LOW (ref 8.9–10.3)
Calcium: 7.8 mg/dL — ABNORMAL LOW (ref 8.9–10.3)
Chloride: 97 mmol/L — ABNORMAL LOW (ref 98–111)
Chloride: 100 mmol/L (ref 98–111)
Creatinine, Ser: 2.37 mg/dL — ABNORMAL HIGH (ref 0.61–1.24)
Creatinine, Ser: 2.15 mg/dL — ABNORMAL HIGH (ref 0.61–1.24)
GFR, Estimated: 32 mL/min — ABNORMAL LOW (ref >60)
GFR, Estimated: 35 mL/min — ABNORMAL LOW (ref >60)
Glucose, Bld: 345 mg/dL — ABNORMAL HIGH (ref 70–99)
Glucose, Bld: 160 mg/dL — ABNORMAL HIGH (ref 70–99)
Potassium: 5.1 mmol/L (ref 3.5–5.1)
Potassium: 5.6 mmol/L — ABNORMAL HIGH (ref 3.5–5.1)
Sodium: 140 mmol/L (ref 135–145)
Sodium: 142 mmol/L (ref 135–145)

## 2021-06-18 LAB — HEPATIC FUNCTION PANEL
ALT: 31 U/L (ref 0–44)
AST: 115 U/L — ABNORMAL HIGH (ref 15–41)
Albumin: 2.1 g/dL — ABNORMAL LOW (ref 3.5–5.0)
Alkaline Phosphatase: 33 U/L — ABNORMAL LOW (ref 38–126)
Bilirubin, Direct: 0.8 mg/dL — ABNORMAL HIGH (ref 0.0–0.2)
Indirect Bilirubin: 0.6 mg/dL (ref 0.3–0.9)
Total Bilirubin: 1.4 mg/dL — ABNORMAL HIGH (ref 0.3–1.2)
Total Protein: 5.8 g/dL — ABNORMAL LOW (ref 6.5–8.1)

## 2021-06-18 LAB — GLUCOSE, CAPILLARY
Glucose-Capillary: 315 mg/dL — ABNORMAL HIGH (ref 70–99)
Glucose-Capillary: 327 mg/dL — ABNORMAL HIGH (ref 70–99)
Glucose-Capillary: 307 mg/dL — ABNORMAL HIGH (ref 70–99)
Glucose-Capillary: 266 mg/dL — ABNORMAL HIGH (ref 70–99)
Glucose-Capillary: 234 mg/dL — ABNORMAL HIGH (ref 70–99)
Glucose-Capillary: 212 mg/dL — ABNORMAL HIGH (ref 70–99)
Glucose-Capillary: 179 mg/dL — ABNORMAL HIGH (ref 70–99)
Glucose-Capillary: 151 mg/dL — ABNORMAL HIGH (ref 70–99)
Glucose-Capillary: 156 mg/dL — ABNORMAL HIGH (ref 70–99)
Glucose-Capillary: 160 mg/dL — ABNORMAL HIGH (ref 70–99)
Glucose-Capillary: 191 mg/dL — ABNORMAL HIGH (ref 70–99)
Glucose-Capillary: 215 mg/dL — ABNORMAL HIGH (ref 70–99)
Glucose-Capillary: 173 mg/dL — ABNORMAL HIGH (ref 70–99)

## 2021-06-18 LAB — MAGNESIUM: Magnesium: 3.1 mg/dL — ABNORMAL HIGH (ref 1.7–2.4)

## 2021-06-18 LAB — LACTIC ACID, PLASMA: Lactic Acid, Venous: 1.6 mmol/L (ref 0.5–1.9)

## 2021-06-18 LAB — FIBRINOGEN: Fibrinogen: >800 mg/dL — ABNORMAL HIGH (ref 210–475)

## 2021-06-18 LAB — LACTATE DEHYDROGENASE: LDH: 323 U/L — ABNORMAL HIGH (ref 98–192)

## 2021-06-18 MED ORDER — SODIUM ZIRCONIUM CYCLOSILICATE 5 G PO PACK
5.0000 g | PACK | Freq: Once | ORAL | Status: DC
  Filled 2021-06-18: qty 1

## 2021-06-18 MED ORDER — INSULIN REGULAR(HUMAN) IN NACL 100-0.9 UT/100ML-% IV SOLN
INTRAVENOUS | Status: DC
  Administered 2021-06-18: 9 [IU]/h via INTRAVENOUS
  Administered 2021-06-18: 14 [IU]/h via INTRAVENOUS
  Administered 2021-06-19: 13 [IU]/h via INTRAVENOUS
  Administered 2021-06-19: 15 [IU]/h via INTRAVENOUS
  Administered 2021-06-20: 16 [IU]/h via INTRAVENOUS
  Administered 2021-06-20: 11 [IU]/h via INTRAVENOUS
  Administered 2021-06-21: 7.5 [IU]/h via INTRAVENOUS
  Administered 2021-06-21: 10.5 [IU]/h via INTRAVENOUS
  Administered 2021-06-22: 11.5 [IU]/h via INTRAVENOUS
  Administered 2021-06-22: 3.6 [IU]/h via INTRAVENOUS
  Administered 2021-06-23: 12 [IU]/h via INTRAVENOUS
  Administered 2021-06-23: 9.5 [IU]/h via INTRAVENOUS
  Administered 2021-06-24: 10.5 [IU]/h via INTRAVENOUS
  Administered 2021-06-24: 3.4 [IU]/h via INTRAVENOUS
  Administered 2021-06-25: 8.5 [IU]/h via INTRAVENOUS
  Filled 2021-06-18 (×15): qty 100

## 2021-06-18 MED ORDER — VANCOMYCIN HCL 1000 MG/200ML IV SOLN
1000.0000 mg | INTRAVENOUS | Status: DC
  Administered 2021-06-18 – 2021-06-19 (×2): 1000 mg via INTRAVENOUS
  Filled 2021-06-18 (×3): qty 200

## 2021-06-18 MED ORDER — QUETIAPINE FUMARATE 25 MG PO TABS
25.0000 mg | ORAL_TABLET | Freq: Two times a day (BID) | ORAL | Status: DC
  Administered 2021-06-18 – 2021-06-19 (×3): 25 mg
  Filled 2021-06-18 (×3): qty 1

## 2021-06-18 MED ORDER — OXYCODONE HCL 5 MG PO TABS
5.0000 mg | ORAL_TABLET | Freq: Four times a day (QID) | ORAL | Status: DC
  Administered 2021-06-18 – 2021-06-22 (×16): 5 mg
  Filled 2021-06-18 (×16): qty 1

## 2021-06-18 MED ORDER — OXYCODONE HCL 5 MG PO TABS: 5.0000 mg | ORAL_TABLET | Freq: Four times a day (QID) | ORAL | Status: DC

## 2021-06-18 MED ORDER — SODIUM ZIRCONIUM CYCLOSILICATE 5 G PO PACK
5.0000 g | PACK | Freq: Once | ORAL | Status: AC
  Administered 2021-06-18: 5 g
  Filled 2021-06-18: qty 1

## 2021-06-18 MED ORDER — CLONAZEPAM 1 MG PO TABS
1.0000 mg | ORAL_TABLET | Freq: Two times a day (BID) | ORAL | Status: DC
  Administered 2021-06-18 – 2021-06-22 (×10): 1 mg
  Filled 2021-06-18 (×11): qty 1

## 2021-06-18 MED ORDER — MIDAZOLAM-SODIUM CHLORIDE 100-0.9 MG/100ML-% IV SOLN
0.5000 mg/h | INTRAVENOUS | Status: DC
  Administered 2021-06-18: 1 mg/h via INTRAVENOUS
  Filled 2021-06-18: qty 100

## 2021-06-18 MED ORDER — DEXTROSE 50 % IV SOLN: 0.0000 mL | INTRAVENOUS | Status: DC | PRN

## 2021-06-18 MED ORDER — SODIUM CHLORIDE 0.9 % IV SOLN
2.0000 g | Freq: Two times a day (BID) | INTRAVENOUS | Status: DC
  Administered 2021-06-18 – 2021-06-19 (×2): 2 g via INTRAVENOUS
  Filled 2021-06-18 (×2): qty 2

## 2021-06-18 MED ORDER — POLYETHYLENE GLYCOL 3350 17 G PO PACK
17.0000 g | PACK | Freq: Two times a day (BID) | ORAL | Status: DC
  Administered 2021-06-18 – 2021-07-02 (×23): 17 g
  Filled 2021-06-18 (×26): qty 1

## 2021-06-18 MED ORDER — PROSOURCE TF PO LIQD
45.0000 mL | Freq: Four times a day (QID) | ORAL | Status: DC
  Administered 2021-06-18 – 2021-06-19 (×2): 45 mL
  Filled 2021-06-18 (×3): qty 45

## 2021-06-18 MED ORDER — CLONAZEPAM 1 MG PO TABS: 1.0000 mg | ORAL_TABLET | Freq: Two times a day (BID) | ORAL | Status: DC

## 2021-06-18 MED ORDER — DEXMEDETOMIDINE HCL IN NACL 400 MCG/100ML IV SOLN
0.4000 ug/kg/h | INTRAVENOUS | Status: DC
  Administered 2021-06-18: 0.3 ug/kg/h via INTRAVENOUS
  Administered 2021-06-18: 0.5 ug/kg/h via INTRAVENOUS
  Administered 2021-06-19: 0.6 ug/kg/h via INTRAVENOUS
  Administered 2021-06-19 (×2): 0.9 ug/kg/h via INTRAVENOUS
  Administered 2021-06-19: 0.6 ug/kg/h via INTRAVENOUS
  Administered 2021-06-20 (×3): 0.9 ug/kg/h via INTRAVENOUS
  Administered 2021-06-20: 1.2 ug/kg/h via INTRAVENOUS
  Administered 2021-06-20 (×2): 0.9 ug/kg/h via INTRAVENOUS
  Administered 2021-06-21: 0.7 ug/kg/h via INTRAVENOUS
  Administered 2021-06-21 (×3): 0.9 ug/kg/h via INTRAVENOUS
  Administered 2021-06-21: 1.1 ug/kg/h via INTRAVENOUS
  Administered 2021-06-21: 0.9 ug/kg/h via INTRAVENOUS
  Administered 2021-06-22: 1.2 ug/kg/h via INTRAVENOUS
  Administered 2021-06-22: 1.1 ug/kg/h via INTRAVENOUS
  Administered 2021-06-22 (×2): 0.9 ug/kg/h via INTRAVENOUS
  Administered 2021-06-22: 0.7 ug/kg/h via INTRAVENOUS
  Administered 2021-06-22: 0.9 ug/kg/h via INTRAVENOUS
  Administered 2021-06-23: 1 ug/kg/h via INTRAVENOUS
  Administered 2021-06-23 (×2): 2 ug/kg/h via INTRAVENOUS
  Administered 2021-06-23: 1 ug/kg/h via INTRAVENOUS
  Administered 2021-06-23: 1.2 ug/kg/h via INTRAVENOUS
  Administered 2021-06-23: 1.8 ug/kg/h via INTRAVENOUS
  Administered 2021-06-24: 1.2 ug/kg/h via INTRAVENOUS
  Administered 2021-06-24: 1 ug/kg/h via INTRAVENOUS
  Administered 2021-06-24: 1.2 ug/kg/h via INTRAVENOUS
  Administered 2021-06-24: 1.5 ug/kg/h via INTRAVENOUS
  Administered 2021-06-24: 1 ug/kg/h via INTRAVENOUS
  Administered 2021-06-24 (×2): 1.2 ug/kg/h via INTRAVENOUS
  Administered 2021-06-25: 1.5 ug/kg/h via INTRAVENOUS
  Administered 2021-06-25: 1 ug/kg/h via INTRAVENOUS
  Administered 2021-06-25 (×3): 1.2 ug/kg/h via INTRAVENOUS
  Administered 2021-06-25: 1.4 ug/kg/h via INTRAVENOUS
  Administered 2021-06-25: 1.5 ug/kg/h via INTRAVENOUS
  Administered 2021-06-25: 1 ug/kg/h via INTRAVENOUS
  Administered 2021-06-26: 1.6 ug/kg/h via INTRAVENOUS
  Administered 2021-06-26: 1.4 ug/kg/h via INTRAVENOUS
  Administered 2021-06-26: 1 ug/kg/h via INTRAVENOUS
  Administered 2021-06-26 (×2): 1.4 ug/kg/h via INTRAVENOUS
  Administered 2021-06-26 (×3): 1 ug/kg/h via INTRAVENOUS
  Administered 2021-06-27: 23:00:00 1.5 ug/kg/h via INTRAVENOUS
  Administered 2021-06-27 – 2021-06-28 (×11): 1.4 ug/kg/h via INTRAVENOUS
  Administered 2021-06-28: 1.7 ug/kg/h via INTRAVENOUS
  Administered 2021-06-28: 1.4 ug/kg/h via INTRAVENOUS
  Administered 2021-06-28: 1.6 ug/kg/h via INTRAVENOUS
  Administered 2021-06-28 (×2): 1.7 ug/kg/h via INTRAVENOUS
  Administered 2021-06-28: 1.6 ug/kg/h via INTRAVENOUS
  Administered 2021-06-29: 1.5 ug/kg/h via INTRAVENOUS
  Administered 2021-06-29: 1.4 ug/kg/h via INTRAVENOUS
  Administered 2021-06-29 (×2): 1.5 ug/kg/h via INTRAVENOUS
  Administered 2021-06-29: 1.4 ug/kg/h via INTRAVENOUS
  Administered 2021-06-29 (×4): 1.5 ug/kg/h via INTRAVENOUS
  Administered 2021-06-30: 1.3 ug/kg/h via INTRAVENOUS
  Administered 2021-06-30 (×2): 1 ug/kg/h via INTRAVENOUS
  Administered 2021-06-30: 1.3 ug/kg/h via INTRAVENOUS
  Administered 2021-06-30 (×2): 1.5 ug/kg/h via INTRAVENOUS
  Administered 2021-06-30: 1.3 ug/kg/h via INTRAVENOUS
  Administered 2021-06-30: 1.5 ug/kg/h via INTRAVENOUS
  Administered 2021-07-01 (×2): 1 ug/kg/h via INTRAVENOUS
  Administered 2021-07-01 (×2): 0.9 ug/kg/h via INTRAVENOUS
  Administered 2021-07-01 (×2): 1 ug/kg/h via INTRAVENOUS
  Administered 2021-07-02 (×2): 0.9 ug/kg/h via INTRAVENOUS
  Administered 2021-07-02: 1.1 ug/kg/h via INTRAVENOUS
  Administered 2021-07-02 (×2): 0.9 ug/kg/h via INTRAVENOUS
  Administered 2021-07-03 (×4): 1 ug/kg/h via INTRAVENOUS
  Filled 2021-06-18 (×5): qty 100
  Filled 2021-06-18: qty 200
  Filled 2021-06-18 (×2): qty 100
  Filled 2021-06-18: qty 200
  Filled 2021-06-18 (×9): qty 100
  Filled 2021-06-18: qty 200
  Filled 2021-06-18 (×2): qty 100
  Filled 2021-06-18: qty 200
  Filled 2021-06-18 (×8): qty 100
  Filled 2021-06-18: qty 200
  Filled 2021-06-18 (×4): qty 100
  Filled 2021-06-18: qty 200
  Filled 2021-06-18 (×2): qty 100
  Filled 2021-06-18: qty 200
  Filled 2021-06-18 (×11): qty 100
  Filled 2021-06-18: qty 200
  Filled 2021-06-18 (×4): qty 100
  Filled 2021-06-18: qty 200
  Filled 2021-06-18: qty 300
  Filled 2021-06-18 (×17): qty 100
  Filled 2021-06-18: qty 200
  Filled 2021-06-18 (×5): qty 100
  Filled 2021-06-18: qty 200
  Filled 2021-06-18 (×3): qty 100
  Filled 2021-06-18: qty 200
  Filled 2021-06-18 (×4): qty 100

## 2021-06-18 MED ORDER — NOREPINEPHRINE 16 MG/250ML-% IV SOLN
0.0000 ug/min | INTRAVENOUS | Status: DC
  Administered 2021-06-18: 14:00:00 18 ug/min via INTRAVENOUS
  Administered 2021-06-22: 2 ug/min via INTRAVENOUS
  Administered 2021-06-25: 12 ug/min via INTRAVENOUS
  Administered 2021-06-26 – 2021-06-27 (×2): 16 ug/min via INTRAVENOUS
  Administered 2021-06-29: 01:00:00 6 ug/min via INTRAVENOUS
  Administered 2021-06-29: 28 ug/min via INTRAVENOUS
  Administered 2021-06-30: 18 ug/min via INTRAVENOUS
  Administered 2021-07-01: 35 ug/min via INTRAVENOUS
  Administered 2021-07-01: 8 ug/min via INTRAVENOUS
  Administered 2021-07-02: 33 ug/min via INTRAVENOUS
  Administered 2021-07-02: 26 ug/min via INTRAVENOUS
  Administered 2021-07-03: 42 ug/min via INTRAVENOUS
  Administered 2021-07-03: 16 ug/min via INTRAVENOUS
  Administered 2021-07-03: 30 ug/min via INTRAVENOUS
  Administered 2021-07-04: 50 ug/min via INTRAVENOUS
  Administered 2021-07-04: 42 ug/min via INTRAVENOUS
  Administered 2021-07-04: 48 ug/min via INTRAVENOUS
  Administered 2021-07-05: 44 ug/min via INTRAVENOUS
  Administered 2021-07-05: 47 ug/min via INTRAVENOUS
  Filled 2021-06-18: qty 500
  Filled 2021-06-18 (×11): qty 250
  Filled 2021-06-18: qty 500
  Filled 2021-06-18 (×7): qty 250

## 2021-06-18 MED ORDER — VITAL 1.5 CAL PO LIQD
1000.0000 mL | ORAL | Status: DC
  Administered 2021-06-18 – 2021-06-24 (×9): 1000 mL

## 2021-06-18 MED ORDER — METOPROLOL TARTRATE 25 MG PO TABS
25.0000 mg | ORAL_TABLET | Freq: Two times a day (BID) | ORAL | Status: DC
  Administered 2021-06-18: 25 mg
  Filled 2021-06-18: qty 1

## 2021-06-18 MED ORDER — QUETIAPINE FUMARATE 25 MG PO TABS: 25.0000 mg | ORAL_TABLET | Freq: Two times a day (BID) | ORAL | Status: DC

## 2021-06-18 NOTE — Progress Notes (Signed)
NAME:  Chris Heichel., MRN:  [id number], DOB:  01/24/1966, LOS: 5 ADMISSION DATE:  06/21/2021, CONSULTATION DATE:  06/15/2019 REFERRING MD:  Ruthann Cancer, CHIEF COMPLAINT:  hypercapnic respiratory failure   History of Present Illness:   Chris Rome. is an 55 y.o. male.  HPI: 55 year old man with a significant history of pre-existing Gold stage III severe COPD for over 10 years on intermittent home oxygen.  More dependent on oxygen recently but apparently good quality of life nonetheless.  Patient is as active as a breathing allows.  HIV disease with undetectable viral load, compliant with HART.  Last CD4 count 350.   Previously admitted 2019 with ARDS secondary to severe influenza.  Required tracheostomy at that time but was successfully decannulated and returned to baseline level of function.   Presented 11/3 with increasing shortness of breath and fevers.  Was initially started on BiPAP and found to be influenza A positive.  Eventually required intubation 11/3.   Consulted for ECMO 11/4 for refractory hypoxia and hypercapnia.  Pertinent  Medical History   Past Medical History:  Diagnosis Date   Anxiety    Atrial fibrillation (HCC)    CHF (congestive heart failure) (HCC)    COPD (chronic obstructive pulmonary disease) (HCC)    Emphysema [[name]]   Depression    Diabetes mellitus without complication ([place])    type 2   Emphysema of lung ([place])    GERD (gastroesophageal reflux disease)    HIV disease ([place])    Hypertension    Oxygen deficiency      Significant Hospital Events: Including procedures, antibiotic start and stop dates in addition to other pertinent events   11/4 - VV ECMO started  Interim History / Subjective:  Minimal fluid requirements overnight. Will wake up and follow commands. Agitated at times.   Objective   Blood pressure (!) 114/55, pulse (!) 120, temperature 98.8 F (37.1 C), temperature source Oral, resp. rate 18, height 5\' 9"  (1.753 m), weight 108.8  kg, SpO2 97 %.    Vent Mode: PCV FiO2 (%):  [40 %-100 %] 60 % Set Rate:  [18 bmp-26 bmp] 18 bmp Vt Set:  [420 mL-560 mL] 420 mL PEEP:  [10 [id number] cmH20] 10 cmH20 Plateau Pressure:  [16 cmH20-29 cmH20] 17 cmH20   Intake/Output Summary (Last 24 hours) at 06/18/2021 0919 Last data filed at 06/18/2021 0900 Gross per 24 hour  Intake 3269.51 ml  Output 2825 ml  Net 444.51 ml   Filed Weights   06/16/21 0500 06/18/2021 0500 06/18/21 0500  Weight: 112.9 kg 112.9 kg 108.8 kg    Examination: Constitutional:      Appearance: He is obese.     Interventions: He is sedated and intubated.  HENT:     Head:     Comments: OG tube, orally intubated. Neck:     Thyroid: No thyroid mass.     Trachea: Trachea normal. No tracheal deviation.  Cardiovascular:     Heart sounds: Normal heart sounds.  Pulmonary:     Effort: He is intubated.     Comments: No wheezing.  Vesicular breath sounds throughout.  Some bronchial breath sounds at bases. Currently on rest ventilator settings with Vt in the 200's Ppeak 20.  Abdominal:     General: Abdomen is protuberant.  No bowel sounds.     Palpations: Abdomen is soft.  Genitourinary:    Comments: Foley catheter in place clear urine. Neurological:     Mental Status: He is unresponsive  to verbal commands.      Comments: Sedated    Ancillary tests:   ABG    Component Value Date/Time   PHART 7.461 (H) 06/18/2021 0506   PCO2ART 46.4 06/18/2021 0506   PO2ART 144 (H) 06/18/2021 0506   HCO3 33.2 (H) 06/18/2021 0506   TCO2 35 (H) 06/18/2021 0506   ACIDBASEDEF 3.2 (H) 12/14/2017 2135   O2SAT 99.0 06/18/2021 0506    Micro: moderate GNR but no WBC.  Hyperglycemic in [id number] Metabolic alkalosis Creatinine increased to 2.37  Assessment & Plan:  Critically ill due to acute on chronic hypoxic and hypercapnic respiratory failure from influenza pneumonia on background of severe COPD.  Unable to provide adequate gas exchange with lung protective ventilation and  and spite of chemical paralysis. Critically ill due to rapid atrial fibrillation requiring titration of amiodarone Stage III COPD HIV disease well-controlled on ART Undiagnosed OSA. GERD History of diastolic heart failure Type 2 diabetes with uncontrolled hyperglycemia.   Doing well overall with acceptable gas exchange. Day of success today and begin to wean sedation tomorrow.  How he responds to sedation wean will provide an indication of anticipated duration of ECMO support and help plan need for tracheostomy  Plan:  - Continue VV-ECMO support and start to wean sweep slowly.  - Continue rest settings for now  - Euvolemic at this time  - Insulin infusion to correct hyperglycemia  - Start enteral nutrition and sedation.  - Added metoprolol for rate control. Would like to avoid long-term amiodarone given lung toxicity potential.   Best Practice (right click and "Reselect all SmartList Selections" daily)   Diet/type: tubefeeds DVT prophylaxis: bivalirudin GI prophylaxis: PPI Lines: Central line and Arterial Line Foley:  Yes, and it is still needed Code Status:  full code Last date of multidisciplinary goals of care discussion [wife was updated at the bedside today. Explained that patient doing well. She asked about endpoints of support. I explained to her that they were 1. Recovery, 2. Failure of lungs to improve, 3. Development of other organ failures 4. ECMO circuit complications.   Labs   CBC: Recent Labs  Lab 06/27/2021 0412 07/01/2021 0420 06/16/21 0308 06/16/21 1231 06/26/2021 0257 07/03/2021 0408 06/26/2021 1857 06/28/2021 1905 07/02/2021 2356 06/25/2021 2358 06/18/21 0329 06/18/21 0334 06/18/21 0501 06/18/21 0502 06/18/21 0506  WBC 13.7*   < > 11.2*  --  7.9  --  15.0*  --  14.0*  --  13.9*  --   --   --   --   NEUTROABS 9.8*  --   --   --  6.3  --   --   --   --   --   --   --   --   --   --   HGB 14.1   < > 12.2*   < > 12.4*   < > 10.0*   < > 9.7*   < > 9.6* 10.2* 9.5* 9.5*  9.2*  HCT 45.7   < > 40.6   < > 43.3   < > 34.2*   < > 32.8*   < > 32.5* 30.0* 28.0* 28.0* 27.0*  MCV 93.8   < > 96.9  --  100.5*  --  100.6*  --  100.0  --  100.0  --   --   --   --   PLT 207   < > 176  --  157  --  169  --  167  --  163  --   --   --   --    < > = values in this interval not displayed.    Basic Metabolic Panel: Recent Labs  Lab 06/16/21 0308 06/16/21 1100 06/16/21 1231 06/16/21 2214 06/20/2021 0257 06/28/2021 0408 07/06/2021 1857 06/15/2021 1905 06/18/21 0329 06/18/21 0334 06/18/21 0501 06/18/21 0502 06/18/21 0506  NA 139 140   < >  --  143   < > 140   < > 140 140 139 140 141  K 5.5* 5.4*   < >  --  5.1   < > 5.5*   < > 5.1 4.8 4.9 4.8 4.6  CL 99 100  --   --  102  --  98  --  97*  --   --   --   --   CO2 35* 33*  --   --  33*  --  30  --  34*  --   --   --   --   GLUCOSE 236* 284*  --   --  149*  --  239*  --  345*  --   --   --   --   BUN 54* 56*  --   --  66*  --  86*  --  85*  --   --   --   --   CREATININE 1.68* 1.69*  --   --  1.64*  --  2.79*  --  2.37*  --   --   --   --   CALCIUM 9.0 9.1  --   --  9.1  --  8.0*  --  7.8*  --   --   --   --   MG 2.9*  --   --  3.4*  --   --  2.9*  --  3.1*  --   --   --   --    < > = values in this interval not displayed.   GFR: Estimated Creatinine Clearance: 42.8 mL/min (A) (by C-G formula based on SCr of 2.37 mg/dL (H)). Recent Labs  Lab 07/04/2021 0257 06/11/2021 1857 06/28/2021 2155 06/19/2021 2356 06/18/21 0329  WBC 7.9 15.0*  --  14.0* 13.9*  LATICACIDVEN  --  2.7* 2.2*  --  1.6    Liver Function Tests: Recent Labs  Lab 06/14/21 0314 07/02/2021 1857 06/18/21 0329  AST 23 62* 115*  ALT 22 26 31   ALKPHOS 45 32* 33*  BILITOT 0.6 2.6* 1.4*  PROT 7.7 5.7* 5.8*  ALBUMIN 3.2* 2.2* 2.1*   No results for input(s): LIPASE, AMYLASE in the last 168 hours. No results for input(s): AMMONIA in the last 168 hours.  ABG    Component Value Date/Time   PHART 7.461 (H) 06/18/2021 0506   PCO2ART 46.4 06/18/2021 0506    PO2ART 144 (H) 06/18/2021 0506   HCO3 33.2 (H) 06/18/2021 0506   TCO2 35 (H) 06/18/2021 0506   ACIDBASEDEF 3.2 (H) 12/14/2017 2135   O2SAT 99.0 06/18/2021 0506     Coagulation Profile: Recent Labs  Lab 07/08/2021 1857 06/18/21 0329  INR 1.7* 2.1*    Cardiac Enzymes: No results for input(s): CKTOTAL, CKMB, CKMBINDEX, TROPONINI in the last 168 hours.  HbA1C: HbA1c, POC (controlled diabetic range)  Date/Time Value Ref Range Status  04/05/2021 02:09 PM 7.8 (A) 0.0 - 7.0 % Final  04/20/2020 11:13 AM 8.2 (A) 0.0 - 7.0 % Final   Hgb A1c MFr Bld  Date/Time Value  Ref Range Status  06/19/2021 04:12 AM 6.8 (H) 4.8 - 5.6 % Final    Comment:    (NOTE) Pre diabetes:          5.7%-6.4%  Diabetes:              >6.4%  Glycemic control for   <7.0% adults with diabetes   10/30/2020 02:25 AM 7.3 (H) 4.8 - 5.6 % Final    Comment:    (NOTE) Pre diabetes:          5.7%-6.4%  Diabetes:              >6.4%  Glycemic control for   <7.0% adults with diabetes     CBG: Recent Labs  Lab 06/13/2021 1517 06/18/2021 1902 07/04/2021 2356 06/18/21 0332 06/18/21 0741  GLUCAP 145* 229* 282* 315* 327*   CRITICAL CARE Performed by: Kipp Brood   Total critical care time: 45 minutes  Critical care time was exclusive of separately billable procedures and treating other patients.  Critical care was necessary to treat or prevent imminent or life-threatening deterioration.  Critical care was time spent personally by me on the following activities: development of treatment plan with patient and/or surrogate as well as nursing, discussions with consultants, evaluation of patient's response to treatment, examination of patient, obtaining history from patient or surrogate, ordering and performing treatments and interventions, ordering and review of laboratory studies, ordering and review of radiographic studies, pulse oximetry, re-evaluation of patient's condition and participation in multidisciplinary  rounds.  Kipp Brood, MD [facility] ICU Physician [place]  Pager: [phone] Mobile: [phone] After hours: [phone].

## 2021-06-18 NOTE — Progress Notes (Signed)
Nutrition Follow-up / Consult  DOCUMENTATION CODES:   Obesity unspecified  INTERVENTION:   Tube feeding via small bore feeding tube (recommend postpyloric tube placement when able via Radiology or [facility] team): Vital 1.5 at 25 ml/h, increase by 10 ml every 8 hours to goal rate of 65 ml/h (1560 ml per day) Prosource TF 45 ml QID  Provides 2500 kcal, 149 gm protein, 1192 ml free water daily.  NUTRITION DIAGNOSIS:   Inadequate oral intake related to inability to eat as evidenced by NPO status.  Ongoing  GOAL:   Provide needs based on ASPEN/SCCM guidelines  Progressing  MONITOR:   Vent status, TF tolerance, Labs  REASON FOR ASSESSMENT:   Ventilator, Consult Enteral/tube feeding initiation and management  ASSESSMENT:   55 yo male admitted with respiratory distress r/t influenza A. Required intubation on 11/3. PMH includes COPD, on 2 L oxygen at baseline, A fib, HF, HTN, DM-2, HIV.  Patient initiated on VV ECMO 11/7. Small bore feeding tube placed at bedside by MD 11/7. Tip is gastric.  Recommend advancing tube postpyloric, given recent hx of emesis.  TF has been on hold since 11/6 d/t emesis.   No BM since 11/2. Bowel regimen has been ordered.  Patient is currently intubated on ventilator support MV: 4.7 L/min Temp (24hrs), Avg:98.3 F (36.8 C), Min:97.9 F (36.6 C), Max:98.8 F (37.1 C)   Labs reviewed.  CBG: [phone]  Medications reviewed and include Tivicay, Solumedrol, Protonix, Miralax, Senokot-S, Insulin drip, Levophed, Precedex, Versed, Fentanyl.  I/O +119 ml since admission.  Diet Order:   Diet Order             Diet NPO time specified  Diet effective now                   EDUCATION NEEDS:   Not appropriate for education at this time  Skin:  Skin Assessment: Reviewed RN Assessment  Last BM:  11/2  Height:   Ht Readings from Last 1 Encounters:  07/01/2021 5\' 9"  (1.753 m)    Weight:   Wt Readings from Last 1 Encounters:   06/18/21 108.8 kg    BMI:  Body mass index is 35.42 kg/m.  Estimated Nutritional Needs:   Kcal:  13/08/22  Protein:  145-155 gm  Fluid:  >/= 2 L    [id number], RD, LDN, CNSC Please refer to Amion for contact information.

## 2021-06-18 NOTE — Progress Notes (Signed)
Pharmacy Antibiotic Note  Yuma Pacella Vales [name]. is a 55 y.o. male admitted on 06/15/2021 with pneumonia. Patient was started on BiPAP and found to be Influenza A positive, requiring intubation on 11/3. Patient cannulated and ECMO initiated on 11/7. Pharmacy consulted for vancomycin dosing.   Patient received a loading dose of Vancomycin 2500 mg x 1 on 11/7. ~24hr Vancomycin random level 18 (goal 15-20 mcg/mL), this morning.  Plan: Vancomycin 1000 mg IV q24h Monitor renal function F/u clinical status, cultures/sensitivities, abx LOT Obtain vancomycin levels as appropriate   Height: 5\' 9"  (175.3 cm) Weight: 108.8 kg (239 lb 13.8 oz) IBW/kg (Calculated) : 70.7  Temp (24hrs), Avg:98.1 F (36.7 C), Min:97.5 F (36.4 C), Max:98.8 F (37.1 C)  Recent Labs  Lab 06/16/21 0308 06/16/21 1100 07/04/2021 0257 07/09/2021 1857 06/25/2021 2155 07/08/2021 2356 06/18/21 0329 06/18/21 1008  WBC 11.2*  --  7.9 15.0*  --  14.0* 13.9*  --   CREATININE 1.68* 1.69* 1.64* 2.79*  --   --  2.37*  --   LATICACIDVEN  --   --   --  2.7* 2.2*  --  1.6  --   VANCORANDOM  --   --   --   --   --   --   --  18    Estimated Creatinine Clearance: 42.8 mL/min (A) (by C-G formula based on SCr of 2.37 mg/dL (H)).    Allergies  Allergen Reactions   Bactrim [Sulfamethoxazole-Trimethoprim] Hives    Antimicrobials this admission: Ceftriaxone 11/7 >>  Vancomycin 11/7 >> Unasyn 11/6 >> 11/7 Oseltamivir 11/3 >> 11/7  Microbiology results: 11/7 Sputum Cx: in process, (gram + cocci) 11/7 Staphylococcus aureus: positive 11/7 MRSA PCR: negative 11/3 Resp panel: Influenza A positive 11/3 MRSA PCR: not detected   Thank you for allowing pharmacy to be a part of this patient's care.  13/3, PharmD PGY1 Pharmacy Resident Phone [phone] 06/18/2021 11:35 AM   Please check AMION for all [facility] Pharmacy phone numbers After 10:00 PM, call Main Pharmacy [phone]

## 2021-06-18 NOTE — Progress Notes (Addendum)
ANTICOAGULATION CONSULT NOTE   Pharmacy Consult for Bivalirudin Indication:  ECMO and atrial fibrillation  Allergies  Allergen Reactions   Bactrim [Sulfamethoxazole-Trimethoprim] Hives    Patient Measurements: Height: 5\' 9"  (175.3 cm) Weight: 108.8 kg (239 lb 13.8 oz) IBW/kg (Calculated) : 70.7  Vital Signs: Temp: 98.8 F (37.1 C) (11/08 0800) Temp Source: Oral (11/08 0800) BP: 113/55 (11/08 1003) Pulse Rate: 137 (11/08 1003)  Labs: Recent Labs    06/11/2021 0257 07/08/2021 0408 07/01/2021 1233 06/20/2021 1642 07/06/2021 1857 06/27/2021 1905 06/19/2021 2356 06/26/2021 2358 06/18/21 0329 06/18/21 0334 06/18/21 0506 06/18/21 1008 06/18/21 1013 06/18/21 1116  HGB 12.4*   < >  --    < > 10.0*   < > 9.7*   < > 9.6*   < > 9.2*  --  9.5* 9.2*  HCT 43.3   < >  --    < > 34.2*   < > 32.8*   < > 32.5*   < > 27.0*  --  28.0* 27.0*  PLT 157  --   --   --  169  --  167  --  163  --   --   --   --   --   APTT  --    < >  --   --  75*  --  52*  --  49*  --   --  51*  --   --   LABPROT  --   --   --   --  20.0*  --   --   --  23.1*  --   --   --   --   --   INR  --   --   --   --  1.7*  --   --   --  2.1*  --   --   --   --   --   HEPARINUNFRC  --   --  >1.10*  --  >1.10*  --   --   --   --   --   --   --   --   --   CREATININE 1.64*  --   --   --  2.79*  --   --   --  2.37*  --   --   --   --   --    < > = values in this interval not displayed.    Estimated Creatinine Clearance: 42.8 mL/min (A) (by C-G formula based on SCr of 2.37 mg/dL (H)).   Medical History: Past Medical History:  Diagnosis Date   Anxiety    Atrial fibrillation (HCC)    CHF (congestive heart failure) (HCC)    COPD (chronic obstructive pulmonary disease) (HCC)    Emphysema [[name]]   Depression    Diabetes mellitus without complication (HCC)    type 2   Emphysema of lung (HCC)    GERD (gastroesophageal reflux disease)    HIV disease (HCC)    Hypertension    Oxygen deficiency     Assessment: 55 yo M on  chronic apixaban for atrial fibrillation. Patient was on heparin in preparation for line placement and possible procedures. Last apixaban dose given on 11/7 @ 0948. Patient cannulated and ECMO initiated on 11/7. Patient received heparin bolus at cannulation, followed by initiation of bivalirudin drip. Pharmacy consulted for bivalirudin dosing.   aPTT low at 51, on bivalirudin 0.06 mg/kg/hr. Hgb, Hct stable. No signs/symptoms of bleeding noted.  Goal of Therapy:  aPTT 60 - 80 seconds Monitor platelets by anticoagulation protocol: Yes   Plan:  Increase bivalirudin to 0.07 mg/kg/hr Check aPTT in 4 hours Monitor Hgb, Hct, and for signs/symptoms of bleeding   Jerrilyn Cairo, PharmD PGY1 Pharmacy Resident Phone [phone] 06/18/2021 11:33 AM   Please check AMION for all [facility] phone numbers After 10:00 PM, call Main Pharmacy [phone]

## 2021-06-18 NOTE — Consult Note (Signed)
Consultation Note Date: 06/18/2021   Patient Name: Chris [name].  DOB: [birth year]  MRN: [id number]  Age / Sex: 55 y.o., male  PCP: [name], NP Referring Physician: Audria Nine, DO  Reason for Consultation: Establishing goals of care; ECMO  HPI/Patient Profile: 55 y.o. male  with past medical history of atrial fibrillation, CHF, HTN, severe COPD, h/o ARDS requiring trach but has since been decannulated, diabetes, HIV admitted on 06/14/2021 with fever, shortness of breath, cough worsening over past 3 days. +Influenza A. Required intubation 06/26/2021 and cannulation for initiation of VV ECMO 07/07/2021. Noted past palliative care involvement with previous hospital stay in 2019 when he required trach and CRRT.   Clinical Assessment and Goals of Care: I met today at [facility] bedside along with his wife, Varney Biles, after discussion with Dr. Lynetta Mare. I spoke with Varney Biles regarding her husband's care and goals. We reviewed his similar illness in 2019 when he required tracheostomy and a month in the hospital and many months recovery with multiple hospitalizations. Varney Biles reflects on how difficult this time was and how disappointed she was that he did not stop smoking after this illness. She describes Rowdy as very strong, strong willed, and stubborn. We discussed that this is likely what helped him to recover from his previous illness. 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 However, she also knows that he is fairly social and would not want a life confined to a bed.   Varney Biles is realistic but also wants to ensure that Alper has every opportunity to improve and have more time. We discussed the importance of self care and reflected on the  anxiety and feelings from going through a similar illness a few years ago. They have been married ~16 years but together more ~26 years. Role of palliative care explained to be a support to her to ensure she is getting all the information she needs to make good decisions for [facility] and support for her when faced with difficult decisions. Varney Biles is appreciative of the support. She does have a supportive family (2 daughters and 3 young grandchildren) amongst others but many are not local. [place] and this helped her.   All questions/concerns addressed. Emotional support provided.   Primary Decision Maker NEXT OF KIN wife Varney Biles    SUMMARY OF RECOMMENDATIONS   - Hopeful for eventual improvement  - Goal to have a quality of life where he is not bed bound (even if this takes weeks to months to achieve)  Code Status/Advance Care Planning: Full code   Symptom Management:  Per PCCM, heart failure team.   Palliative Prophylaxis:  Aspiration, Delirium Protocol, Oral Care, and Turn Reposition  Additional Recommendations (Limitations, Scope, Preferences): Full Scope Treatment  Psycho-social/Spiritual:  Desire  for further Chaplaincy support:yes  Prognosis:  Prognosis concerning with underlying severe lung disease.   Discharge Planning: To Be Determined      Primary Diagnoses: Present on Admission:  Influenza A  HIV disease ([place])  Essential hypertension, benign  COPD with acute exacerbation ([place])  Atrial fibrillation with RVR (HCC)  Acute and chronic respiratory failure with hypoxia   Acute on chronic respiratory failure ([place])   I have reviewed the medical record, interviewed the patient and family, and examined the patient. The following aspects are pertinent.  Past Medical History:  Diagnosis Date   Anxiety    Atrial fibrillation (HCC)    CHF (congestive heart failure) (HCC)    COPD (chronic obstructive  pulmonary disease) (HCC)    Emphysema [[name]]   Depression    Diabetes mellitus without complication (HCC)    type 2   Emphysema of lung (HCC)    GERD (gastroesophageal reflux disease)    HIV disease ([place])    Hypertension    Oxygen deficiency    Social History   Socioeconomic History   Marital status: Married    Spouse name: Not on file   Number of children: 0   Years of education: 10   Highest education level: 10th grade  Occupational History   Occupation: Unemployed  Tobacco Use   Smoking status: Former    Packs/day: 0.30    Years: 30.00    Pack years: 9.00    Types: Cigarettes    Quit date: 08/11/2002    Years since quitting: 18.8   Smokeless tobacco: Never   Tobacco comments:    1 per day hfb 05/17/21  Vaping Use   Vaping Use: Never used  Substance and Sexual Activity   Alcohol use: No    Alcohol/week: 0.0 standard drinks   Drug use: No    Comment: quit 04   Sexual activity: Yes    Partners: Female    Birth control/protection: Condom  Other Topics Concern   Not on file  Social History Narrative   Not on file   Social Determinants of Health   Financial Resource Strain: Not on file  Food Insecurity: No Food Insecurity   Worried About Charity fundraiser in the Last Year: Never true   Ran Out of Food in the Last Year: Never true  Transportation Needs: Unmet Transportation Needs   Lack of Transportation (Medical): Yes   Lack of Transportation (Non-Medical): No  Physical Activity: Not on file  Stress: Not on file  Social Connections: Not on file   Family History  Problem Relation Age of Onset   Throat cancer Father 68   Emphysema Maternal Uncle        was a smoker   Diabetes Maternal Uncle    Heart disease Maternal Uncle    COPD Maternal Uncle    Asthma Maternal Uncle    Diabetes Maternal Uncle    Asthma Maternal Aunt    Diabetes Maternal Aunt    Heart disease Maternal Aunt    Throat cancer Maternal Grandmother        never smoker, used snuff    Diabetes Maternal Grandmother    Breast cancer Maternal Aunt    Breast cancer Sister    Colon cancer Neg Hx    Scheduled Meds:  albuterol  5 mg Nebulization Once   budesonide (PULMICORT) nebulizer solution  0.25 mg Nebulization BID   chlorhexidine gluconate (MEDLINE KIT)  15 mL Mouth Rinse BID   Chlorhexidine Gluconate Cloth  6 each Topical Daily   clonazePAM  1 mg Per Tube BID   emtricitabine-tenofovir AF  1 tablet Per Tube Daily   And   dolutegravir  50 mg Per Tube Daily   feeding supplement (PROSource TF)  45 mL Per Tube Daily   ipratropium-albuterol  3 mL Nebulization Q6H   mouth rinse  15 mL Mouth Rinse 10 times per day   methylPREDNISolone (SOLU-MEDROL) injection  40 mg Intravenous Q12H   mupirocin ointment  1 application Nasal BID   nicotine  14 mg Transdermal Daily   oxyCODONE  5 mg Per Tube Q6H   pantoprazole (PROTONIX) IV  40 mg Intravenous QHS   polyethylene glycol  17 g Per Tube BID   QUEtiapine  25 mg Per Tube BID   rosuvastatin  20 mg Per Tube Daily   senna-docusate  1 tablet Per Tube BID   sodium chloride flush  10-40 mL Intracatheter Q12H   sodium chloride flush  3 mL Intravenous Q12H   vancomycin variable dose per unstable renal function (pharmacist dosing)   Does not apply See admin instructions   Continuous Infusions:  sodium chloride     sodium chloride 10 mL/hr at 06/28/2021 1502   sodium chloride     sodium chloride 20 mL/hr at 06/18/21 0900   sodium chloride     amiodarone 30 mg/hr (06/18/21 0900)   bivalirudin (ANGIOMAX) infusion 0.5 mg/mL (Non-ACS indications) 0.06 mg/kg/hr (06/18/21 0900)   cefTRIAXone (ROCEPHIN)  IV Stopped (07/02/2021 2018)   feeding supplement (VITAL HIGH PROTEIN) Stopped (06/16/21 2000)   fentaNYL infusion INTRAVENOUS 200 mcg/hr (06/18/21 0902)   insulin 14 Units/hr (06/18/21 0933)   midazolam 5 mg/hr (06/18/21 0900)   norepinephrine (LEVOPHED) Adult infusion Stopped (06/18/21 0711)   PRN Meds:.sodium chloride, Place/Maintain  arterial line **AND** sodium chloride, acetaminophen **OR** acetaminophen, albuterol, bisacodyl, dextrose, docusate, fentaNYL, ondansetron **OR** ondansetron (ZOFRAN) IV, sodium chloride flush, sodium chloride flush Allergies  Allergen Reactions   Bactrim [Sulfamethoxazole-Trimethoprim] Hives   Review of Systems  Unable to perform ROS: Acuity of condition   Physical Exam Vitals and nursing note reviewed.  Constitutional:      Appearance: He is ill-appearing.     Interventions: He is sedated and intubated.  Cardiovascular:     Rate and Rhythm: Tachycardia present. Rhythm irregularly irregular.  Pulmonary:     Effort: He is intubated.  Abdominal:     Palpations: Abdomen is soft.  Neurological:     Comments: Sedated on vent; VV ECMO    Vital Signs: BP (!) 114/55 (BP Location: Other (Comment)) Comment (BP Location): aline  Pulse (!) 120   Temp 98.8 F (37.1 C) (Oral)   Resp 18   Ht '5\' 9"'  (1.753 m)   Wt 108.8 kg   SpO2 97%   BMI 35.42 kg/m  Pain Scale: CPOT   Pain Score: 0-No pain   SpO2: SpO2: 97 % O2 Device:SpO2: 97 % O2 Flow Rate: .O2 Flow Rate (L/min): 15 L/min  IO: Intake/output summary:  Intake/Output Summary (Last 24 hours) at 06/18/2021 0936 Last data filed at 06/18/2021 0900 Gross per 24 hour  Intake 3269.51 ml  Output 2825 ml  Net 444.51 ml    LBM: Last BM Date: 06/12/21 Baseline Weight: Weight: 112 kg Most recent weight: Weight: 108.8 kg     Palliative Assessment/Data:     Time In: 1500 Time Out: 1600 Time Total: 60 min Greater than 50%  of this time was spent counseling and coordinating care related to  the above assessment and plan.  Signed by: Vinie Sill, NP Palliative Medicine Team Pager # [phone] (M-F 8a-5p) Team Phone # [phone] (Nights/Weekends)

## 2021-06-18 NOTE — Progress Notes (Signed)
PT Cancellation Note  Patient Details Name: Chris [name]. MRN: [id number] DOB: Aug 31, 1965   Cancelled Treatment:    Reason Eval/Treat Not Completed: Patient not medically ready. Pt sedated. Will continue to monitor for readiness.    Angelina Ok [facility] 06/18/2021, 9:51 AM Skip Mayer PT Acute Rehabilitation Services Pager [phone] Office [phone]

## 2021-06-18 NOTE — Progress Notes (Signed)
eLink Physician-Brief Progress Note Patient Name: Chris [name]. DOB: 1966-02-17 MRN: [id number]   Date of Service  06/18/2021  HPI/Events of Note  Patient is intubated and on the ventilator, he need his restraints order renewed to prevent self-extubation.  eICU Interventions  Restraints order renewed.        Thomasene Lot Desirea Mizrahi 06/18/2021, 11:40 PM

## 2021-06-18 NOTE — Progress Notes (Signed)
Patient ID: Chris [name]., male   DOB: [birth year], 55 y.o.   MRN: [id number]  Extracorporeal support note     ECLS support day: 1 Indication: Severe COPD with influenza A   Configuration: VV ECMO, Crescent catheter right IJ   Drainage cannula: Right IJ Crescent Return cannula: Right IJ Crescent   Pump speed: 3400 Pump flow: 4.39 Pump used: Cardiohelp   Sweep gas: 4   Circuit check: Small area of thrombus 3 o'clock  Anticoagulant: bivalirudin Anticoagulation target: PTT 60-80   Changes in support: None today   Anticipated goals/duration of support: wean to decannulation  [name] 06/18/2021 6:41 AM

## 2021-06-18 NOTE — Progress Notes (Signed)
ANTICOAGULATION CONSULT NOTE  Pharmacy Consult for Bivalirudin Indication:  ECMO and atrial fibrillation  Allergies  Allergen Reactions   Bactrim [Sulfamethoxazole-Trimethoprim] Hives    Patient Measurements: Height: 5\' 9"  (175.3 cm) Weight: 112.9 kg (248 lb 14.4 oz) IBW/kg (Calculated) : 70.7  Vital Signs: Temp: 98.1 F (36.7 C) (11/08 0000) Temp Source: Core (11/08 0000) BP: 120/52 (11/08 0000) Pulse Rate: 111 (11/07 1513)  Labs: Recent Labs    06/16/21 1100 06/16/21 1231 06/18/2021 0257 06/15/2021 0408 06/12/2021 1132 07/08/2021 1139 06/13/2021 1233 06/15/2021 1642 06/20/2021 1857 06/21/2021 1905 06/13/2021 2106 06/18/2021 2356 06/27/2021 2358  HGB  --    < > 12.4*   < >  --    < >  --    < > 10.0*   < > 10.5* 9.7* 10.2*  HCT  --    < > 43.3   < >  --    < >  --    < > 34.2*   < > 31.0* 32.8* 30.0*  PLT  --   --  157  --   --   --   --   --  169  --   --  167  --   APTT  --   --   --   --  30  --   --   --  75*  --   --  52*  --   LABPROT  --   --   --   --   --   --   --   --  20.0*  --   --   --   --   INR  --   --   --   --   --   --   --   --  1.7*  --   --   --   --   HEPARINUNFRC  --   --   --   --   --   --  >1.10*  --  >1.10*  --   --   --   --   CREATININE 1.69*  --  1.64*  --   --   --   --   --  2.79*  --   --   --   --    < > = values in this interval not displayed.     Estimated Creatinine Clearance: 37.1 mL/min (A) (by C-G formula based on SCr of 2.79 mg/dL (H)).  Assessment: 55 yo male with h/o Afib, Eliquis on hold, now on ECMO, for bivalirudin  Goal of Therapy:  aPTT 50 - 80 seconds Monitor platelets by anticoagulation protocol: Yes   Plan:  Continue bivalirudin at current rate for now Follow-up am labs.  53, PharmD, BCPS  06/18/2021, 1:44 AM

## 2021-06-18 NOTE — Progress Notes (Signed)
Advanced Heart Failure Rounding Note  PCP-Cardiologist: Glenetta Hew, MD   Subjective:    11/7: VV ECMO cannulation  This morning, patient is on NE 11 with MAP around 70.  HR 90s in atrial fibrillation on amiodarone gtt.   Afebrile, on ceftriaxone, Unasyn, vancomycin.  Oseltamivir for influenza completed.  Solumedrol with severe COPD.   AKI with creatinine up to 2.37.  UOP 1785, urine appears clear in foley.   ECMO: 3400 rpm Flow 4.39 Pvenous -95 DeltaP 26 Sweep 4 ABG 7.41/56/61/91% Current sat 96% PTT 49 (goal 60-80), on bivalirudin Lactate 1.6 LDH 323  CXR: bibasilar infiltrates, stable cannula position   Objective:   Weight Range: 108.8 kg Body mass index is 35.42 kg/m.   Vital Signs:   Temp:  [97.5 F (36.4 C)-99 F (37.2 C)] 98.1 F (36.7 C) (11/08 0400) Pulse Rate:  [93-130] 111 (11/07 1513) Resp:  [0-54] 18 (11/08 0615) BP: (92-122)/(47-86) 114/55 (11/08 0400) SpO2:  [80 %-100 %] 95 % (11/08 0615) Arterial Line BP: (87-174)/(42-65) 106/54 (11/08 0615) FiO2 (%):  [40 %-100 %] 40 % (11/08 0420) Weight:  [108.8 kg] 108.8 kg (11/08 0500) Last BM Date: 06/12/21  Weight change: Filed Weights   06/16/21 0500 07/06/2021 0500 06/18/21 0500  Weight: 112.9 kg 112.9 kg 108.8 kg    Intake/Output:   Intake/Output Summary (Last 24 hours) at 06/18/2021 0644 Last data filed at 06/18/2021 0600 Gross per 24 hour  Intake 3316.48 ml  Output 2485 ml  Net 831.48 ml      Physical Exam    General:  Intubated/sedated HEENT: Normal Neck: LIJ CVL, RIJ Crescent. Carotids 2+ bilat; no bruits. No lymphadenopathy or thyromegaly appreciated. Cor: PMI nondisplaced. Irregular rate & rhythm. No rubs, gallops or murmurs. Lungs: Clear Abdomen: Soft, nontender, nondistended. No hepatosplenomegaly. No bruits or masses. Good bowel sounds. Extremities: No cyanosis, clubbing, rash, edema Neuro: Sedated on vent.    Telemetry   Atrial fibrillation 90s (personally  reviewed)  Labs    CBC Recent Labs    06/12/2021 0257 06/25/2021 0408 06/24/2021 2356 06/26/2021 2358 06/18/21 0329 06/18/21 0334 06/18/21 0502 06/18/21 0506  WBC 7.9   < > 14.0*  --  13.9*  --   --   --   NEUTROABS 6.3  --   --   --   --   --   --   --   HGB 12.4*   < > 9.7*   < > 9.6*   < > 9.5* 9.2*  HCT 43.3   < > 32.8*   < > 32.5*   < > 28.0* 27.0*  MCV 100.5*   < > 100.0  --  100.0  --   --   --   PLT 157   < > 167  --  163  --   --   --    < > = values in this interval not displayed.   Basic Metabolic Panel Recent Labs    07/01/2021 1857 06/23/2021 1905 06/18/21 0329 06/18/21 0334 06/18/21 0502 06/18/21 0506  NA 140   < > 140   < > 140 141  K 5.5*   < > 5.1   < > 4.8 4.6  CL 98  --  97*  --   --   --   CO2 30  --  34*  --   --   --   GLUCOSE 239*  --  345*  --   --   --  BUN 86*  --  85*  --   --   --   CREATININE 2.79*  --  2.37*  --   --   --   CALCIUM 8.0*  --  7.8*  --   --   --   MG 2.9*  --  3.1*  --   --   --    < > = values in this interval not displayed.   Liver Function Tests Recent Labs    07/06/2021 1857 06/18/21 0329  AST 62* 115*  ALT 26 31  ALKPHOS 32* 33*  BILITOT 2.6* 1.4*  PROT 5.7* 5.8*  ALBUMIN 2.2* 2.1*   No results for input(s): LIPASE, AMYLASE in the last 72 hours. Cardiac Enzymes No results for input(s): CKTOTAL, CKMB, CKMBINDEX, TROPONINI in the last 72 hours.  BNP: BNP (last 3 results) Recent Labs    10/29/20 1214  BNP 102.9*    ProBNP (last 3 results) No results for input(s): PROBNP in the last 8760 hours.   D-Dimer No results for input(s): DDIMER in the last 72 hours. Hemoglobin A1C No results for input(s): HGBA1C in the last 72 hours. Fasting Lipid Panel Recent Labs    06/16/21 1100  TRIG 164*   Thyroid Function Tests No results for input(s): TSH, T4TOTAL, T3FREE, THYROIDAB in the last 72 hours.  Invalid input(s): FREET3  Other results:   Imaging    DG Abd 1 View  Result Date: 06/29/2021 CLINICAL  DATA:  Feeding tube placement. EXAM: ABDOMEN - 1 VIEW COMPARISON:  None. FINDINGS: The bowel gas pattern is normal. Distal tip of feeding tube is seen in expected position of the stomach. No radio-opaque calculi or other significant radiographic abnormality are seen. IMPRESSION: Distal tip of feeding tube seen in expected position of the stomach. Electronically Signed   By: Marijo Conception M.D.   On: 06/22/2021 15:26   CARDIAC CATHETERIZATION  Result Date: 06/18/2021 Successful placement of Crescent catheter with initiation of VV ECMO.   DG Chest Port 1 View  Result Date: 06/23/2021 CLINICAL DATA:  History of CHF and COPD.  ECMO. EXAM: PORTABLE CHEST 1 VIEW COMPARISON:  Chest radiograph dated 06/12/2021. FINDINGS: Interval removal of the previously seen right IJ central venous line and placement of a cannula extending down into the IVC. Endotracheal tube remains above the carina. Feeding tube extends below the diaphragm with tip beyond the inferior margin of the image. Left IJ central venous line with tip over central SVC. Bilateral mid to lower lung field streaky and confluent densities similar to prior radiograph. Probable small bilateral pleural effusions. No pneumothorax. Stable cardiomegaly. No acute osseous pathology. IMPRESSION: 1. Interval removal of the previously seen right IJ central venous line and placement of a cannula extending into the IVC. 2. Stable cardiomegaly. Stable bilateral mid to lower lung field streaky and confluent densities. Electronically Signed   By: Anner Crete M.D.   On: 06/23/2021 19:25   DG CHEST PORT 1 VIEW  Result Date: 06/15/2021 CLINICAL DATA:  Central line placement EXAM: PORTABLE CHEST 1 VIEW COMPARISON:  06/28/2021 at 1142 hours FINDINGS: Interval placement of right IJ central venous catheter with distal tip terminating in the region of the proximal SVC. Stable positioning of ET tube. Enteric tube courses below the diaphragm with distal tip beyond the  inferior margin of the film. Stable heart size. Persistent bibasilar opacities, right worse than left. No pneumothorax. IMPRESSION: Interval placement of right IJ central venous catheter. No pneumothorax. Electronically Signed  By: Davina Poke D.O.   On: 07/10/2021 15:24   DG CHEST PORT 1 VIEW  Result Date: 06/15/2021 CLINICAL DATA:  Evaluation of endotracheal and orogastric tube EXAM: PORTABLE CHEST 1 VIEW COMPARISON:  Radiographs dated June 16, 2021 FINDINGS: The heart size and mediastinal contours are within normal limits. Bibasilar atelectasis, right greater than the left, unchanged. Endotracheal tube with distal tip approximately 4 cm above the carina. Feeding tube coursing below the diaphragm the distal tip not included. IMPRESSION: Endotracheal tube with distal tip approximately 4 cm above the carina. Feeding tube coursing below the diaphragm with distal tip not included. No significant interval change and bibasilar lung opacities, right greater than left concerning for atelectasis or infiltrate. Electronically Signed   By: Keane Police D.O.   On: 06/16/2021 11:58   DG Abd Portable 1V  Result Date: 07/02/2021 CLINICAL DATA:  Ileus. EXAM: PORTABLE ABDOMEN - 1 VIEW COMPARISON:  Yesterday. FINDINGS: Orogastric tube tip in the mid stomach. Less injected contrast in the gastric fundus with a small amount seen more distally in the antrum. No bowel contrast or dilatation seen. Interval mild patchy opacity at the right lung base and continued denser opacity at the left lung base. Right hip fixation hardware. IMPRESSION: 1. No radiographic evidence of ileus or obstruction. 2. Bibasilar atelectasis and possible pneumonia, left greater than right. Electronically Signed   By: Claudie Revering M.D.   On: 07/04/2021 09:27   VAS [place] LOWER EXTREMITY VENOUS (DVT)  Result Date: 06/12/2021  Lower Venous DVT Study Patient Name:  Earlie Schank Ernster [name]  Date of Exam:   07/09/2021 Medical Rec #: [id number]           Accession #:    [id number] Date of Birth: September 25, 1965           Patient Gender: M Patient Age:   55 years Exam Location:  [facility] Procedure:      VAS [place] LOWER EXTREMITY VENOUS (DVT) Referring Phys: JESSICA MARSHALL --------------------------------------------------------------------------------  Indications: Edema.  Limitations: Body habitus and poor ultrasound/tissue interface. Comparison Study: No prior study Performing Technologist: Maudry Mayhew MHA, RDMS, RVT, RDCS  Examination Guidelines: A complete evaluation includes B-mode imaging, spectral Doppler, color Doppler, and power Doppler as needed of all accessible portions of each vessel. Bilateral testing is considered an integral part of a complete examination. Limited examinations for reoccurring indications may be performed as noted. The reflux portion of the exam is performed with the patient in reverse Trendelenburg.  +---------+---------------+---------+-----------+----------+--------------+ RIGHT    CompressibilityPhasicitySpontaneityPropertiesThrombus Aging +---------+---------------+---------+-----------+----------+--------------+ CFV      Full           Yes      Yes                                 +---------+---------------+---------+-----------+----------+--------------+ SFJ      Full                                                        +---------+---------------+---------+-----------+----------+--------------+ FV Prox  Full                                                        +---------+---------------+---------+-----------+----------+--------------+  FV Mid   Full                                                        +---------+---------------+---------+-----------+----------+--------------+ FV DistalFull                                                        +---------+---------------+---------+-----------+----------+--------------+ PFV      Full                                                         +---------+---------------+---------+-----------+----------+--------------+ POP      Full           Yes      Yes                                 +---------+---------------+---------+-----------+----------+--------------+ PTV      Full                                                        +---------+---------------+---------+-----------+----------+--------------+ PERO     Full                                                        +---------+---------------+---------+-----------+----------+--------------+   Right Technical Findings: Not visualized segments include Limited visualization PTV and peroneal veins.  +---------+---------------+---------+-----------+----------+--------------+ LEFT     CompressibilityPhasicitySpontaneityPropertiesThrombus Aging +---------+---------------+---------+-----------+----------+--------------+ CFV      Full           Yes      Yes                                 +---------+---------------+---------+-----------+----------+--------------+ SFJ      Full                                                        +---------+---------------+---------+-----------+----------+--------------+ FV Prox  Full                                                        +---------+---------------+---------+-----------+----------+--------------+ FV Mid   Full                                                        +---------+---------------+---------+-----------+----------+--------------+  FV DistalFull                                                        +---------+---------------+---------+-----------+----------+--------------+ PFV      Full                                                        +---------+---------------+---------+-----------+----------+--------------+ POP      Full           Yes      Yes                                 +---------+---------------+---------+-----------+----------+--------------+  PTV      Full                                                        +---------+---------------+---------+-----------+----------+--------------+ PERO     Full                                                        +---------+---------------+---------+-----------+----------+--------------+   Left Technical Findings: Not visualized segments include Limited visualization PTV and peroneal veins.   Summary: RIGHT: - There is no evidence of deep vein thrombosis in the lower extremity. However, portions of this examination were limited- see technologist comments above.  - No cystic structure found in the popliteal fossa.  LEFT: - There is no evidence of deep vein thrombosis in the lower extremity. However, portions of this examination were limited- see technologist comments above.  - No cystic structure found in the popliteal fossa.  *See table(s) above for measurements and observations. Electronically signed by Servando Snare MD on 06/28/2021 at 2:03:46 PM.    Final      Medications:     Scheduled Medications:  albuterol  5 mg Nebulization Once   budesonide (PULMICORT) nebulizer solution  0.25 mg Nebulization BID   chlorhexidine gluconate (MEDLINE KIT)  15 mL Mouth Rinse BID   Chlorhexidine Gluconate Cloth  6 each Topical Daily   emtricitabine-tenofovir AF  1 tablet Per Tube Daily   And   dolutegravir  50 mg Per Tube Daily   feeding supplement (PROSource TF)  45 mL Per Tube Daily   insulin aspart  0-20 Units Subcutaneous Q4H   insulin aspart  3 Units Subcutaneous Q4H   insulin glargine-yfgn  10 Units Subcutaneous Daily   ipratropium-albuterol  3 mL Nebulization Q6H   mouth rinse  15 mL Mouth Rinse 10 times per day   methylPREDNISolone (SOLU-MEDROL) injection  40 mg Intravenous Q12H   mupirocin ointment  1 application Nasal BID   nicotine  14 mg Transdermal Daily   pantoprazole (PROTONIX) IV  40 mg Intravenous QHS   polyethylene glycol  17 g Per Tube Daily   rosuvastatin  20 mg Per Tube  Daily   senna-docusate  1 tablet Per Tube BID   sodium chloride flush  10-40 mL Intracatheter Q12H   sodium chloride flush  3 mL Intravenous Q12H   vancomycin variable dose per unstable renal function (pharmacist dosing)   Does not apply See admin instructions    Infusions:  sodium chloride     sodium chloride 10 mL/hr at 07/01/2021 1502   sodium chloride     sodium chloride 20 mL/hr at 06/18/21 0600   sodium chloride     amiodarone 30 mg/hr (06/18/21 0600)   bivalirudin (ANGIOMAX) infusion 0.5 mg/mL (Non-ACS indications) 0.06 mg/kg/hr (06/18/21 0600)   cefTRIAXone (ROCEPHIN)  IV Stopped (06/25/2021 2018)   feeding supplement (VITAL HIGH PROTEIN) Stopped (06/16/21 2000)   fentaNYL infusion INTRAVENOUS 200 mcg/hr (06/18/21 0600)   midazolam     norepinephrine (LEVOPHED) Adult infusion 11 mcg/min (06/18/21 0642)    PRN Medications: sodium chloride, Place/Maintain arterial line **AND** sodium chloride, acetaminophen **OR** acetaminophen, albuterol, bisacodyl, docusate, fentaNYL, ondansetron **OR** ondansetron (ZOFRAN) IV, sodium chloride flush, sodium chloride flush   Assessment/Plan   1. Acute hypoxemic and hypercarbic respiratory failure: In setting of COPD on home oxygen at baseline.  He has severe influenza infection.  Refractory hypoxemia and hypercarbia with maximal management.  He has underlying significant lung disease but has reasonable quality of life and had ARDS in the past requiring tracheostomy but was able to wean off.  RESP score is acceptable.  VV ECMO cannulation on 11/7.  This morning, lactate is 1.6.  ABG 7.41/56/61/91% with sats currently 96%.  Respiratory acidosis resolved.  PaO2 remains low but sats consistently in 90s and improved PaCO2 (respiratory acidosis drove initiation of ECMO).  Sweep is at 4.  Cannula position stable on CXR, bibasilar infiltrates present.  LDH ok.   - Would favor continuing current support.  - Increase bivalirudin for PTT 60-80.  - No diuresis  today with adequate UOP and rising creatinine.  - Antibiotic coverage with Unasyn, ceftriaxone, vancomycin.  - Solumedrol for severe COPD.  2. Atrial fibrillation: He has been in atrial fibrillation on all ECGs since 2021, suspect chronic at this point. On Eliquis at home.  - Will manage on bivalirudin.  - Amiodarone gtt for rate control.  - Consider cardioversion in future.  3.  AKI: Creatinine 1.6 => 2.37.  Urine looks clear, LDH not significantly elevated.  - Follow for now, no diuresis today.  4. Type 2 DM: SSI.  5. Influenza: Severe.  Completed oseltamivir.  6. COPD: Still smokes 1-2 cigs/day.  Intermittent home oxygen.  Followed by pulmonary, has been stable at home for years.  7. HIV: Last viral load was undetectable.  On treatment.  8. Shock: Suspect primarily septic shock.  Echo showed EF 55-60% with normal RV.  - Continue norepinephrine, titrate down as able.   CRITICAL CARE Performed by: Loralie Champagne  Total critical care time: 40 minutes  Critical care time was exclusive of separately billable procedures and treating other patients.  Critical care was necessary to treat or prevent imminent or life-threatening deterioration.  Critical care was time spent personally by me on the following activities: development of treatment plan with patient and/or surrogate as well as nursing, discussions with consultants, evaluation of patient's response to treatment, examination of patient, obtaining history from patient or surrogate, ordering and performing treatments and interventions, ordering and review of laboratory studies, ordering and review of radiographic studies, pulse oximetry and re-evaluation of patient's condition.  Length of Stay: [place], MD  06/18/2021, 6:44 AM  Advanced Heart Failure Team Pager [phone] (M-F; 7a - 5p)  Please contact [facility] for night-coverage after hours (5p -7a ) and weekends on [email or web address]

## 2021-06-18 NOTE — Progress Notes (Signed)
ANTICOAGULATION CONSULT NOTE   Pharmacy Consult for Bivalirudin Indication:  ECMO and atrial fibrillation  Allergies  Allergen Reactions   Bactrim [Sulfamethoxazole-Trimethoprim] Hives    Patient Measurements: Height: 5\' 9"  (175.3 cm) Weight: 108.8 kg (239 lb 13.8 oz) IBW/kg (Calculated) : 70.7  Vital Signs: Temp: 98.1 F (36.7 C) (11/08 0400) Temp Source: Core (11/08 0400) BP: 114/55 (11/08 0400)  Labs: Recent Labs    06/20/2021 0257 06/19/2021 0408 06/22/2021 1233 07/01/2021 1642 06/15/2021 1857 06/24/2021 1905 06/20/2021 2356 06/22/2021 2358 06/18/21 0329 06/18/21 0334 06/18/21 0501 06/18/21 0502 06/18/21 0506  HGB 12.4*   < >  --    < > 10.0*   < > 9.7*   < > 9.6*   < > 9.5* 9.5* 9.2*  HCT 43.3   < >  --    < > 34.2*   < > 32.8*   < > 32.5*   < > 28.0* 28.0* 27.0*  PLT 157  --   --   --  169  --  167  --  163  --   --   --   --   APTT  --    < >  --   --  75*  --  52*  --  49*  --   --   --   --   LABPROT  --   --   --   --  20.0*  --   --   --  23.1*  --   --   --   --   INR  --   --   --   --  1.7*  --   --   --  2.1*  --   --   --   --   HEPARINUNFRC  --   --  >1.10*  --  >1.10*  --   --   --   --   --   --   --   --   CREATININE 1.64*  --   --   --  2.79*  --   --   --  2.37*  --   --   --   --    < > = values in this interval not displayed.     Estimated Creatinine Clearance: 42.8 mL/min (A) (by C-G formula based on SCr of 2.37 mg/dL (H)).   Medical History: Past Medical History:  Diagnosis Date   Anxiety    Atrial fibrillation (HCC)    CHF (congestive heart failure) (HCC)    COPD (chronic obstructive pulmonary disease) (HCC)    Emphysema [[name]]   Depression    Diabetes mellitus without complication (HCC)    type 2   Emphysema of lung (HCC)    GERD (gastroesophageal reflux disease)    HIV disease (HCC)    Hypertension    Oxygen deficiency     Assessment: 55 Y/O male on chronic apixaban for atrial fibrillation. Was on heparin in preparation for line  placement and possible procedures. Last apixaban dose given @0948  11/7. Patient cannulated and ECMO initiated this afternoon.  Switched from heparin infusion to bivalirudin.  Post cannulation aPTT 75 seconds - will start bivalirudin  11/8 AM update:  aPTT low at 49  Goal of Therapy:  aPTT 50 - 80 seconds, CCM ECMO note says 60-80 (clarify with CCM 11/8 AM rounds) Monitor platelets by anticoagulation protocol: Yes   Plan:  Inc bivalirudin 0.06 mg/kg/hr Check aPTT in 4 hours Clarify aPTT  goal with ECMO team  Abran Duke, PharmD, BCPS Clinical Pharmacist Phone: [phone]

## 2021-06-18 NOTE — Progress Notes (Signed)
OT Cancellation Note  Patient Details Name: Chris [name]. MRN: [id number] DOB: 07/31/1966   Cancelled Treatment:    Reason Eval/Treat Not Completed: Patient not medically ready Pt intubated and sedated. Will continue to monitor for therapy readiness  Lorre Munroe 06/18/2021, 10:59 AM

## 2021-06-18 NOTE — Progress Notes (Addendum)
ANTICOAGULATION CONSULT NOTE   Pharmacy Consult for Bivalirudin Indication:  ECMO and atrial fibrillation  Allergies  Allergen Reactions   Bactrim [Sulfamethoxazole-Trimethoprim] Hives    Patient Measurements: Height: 5\' 9"  (175.3 cm) Weight: 108.8 kg (239 lb 13.8 oz) IBW/kg (Calculated) : 70.7  Vital Signs: Temp: 98.4 F (36.9 C) (11/08 1600) Temp Source: Core (11/08 1600) BP: 113/55 (11/08 1003) Pulse Rate: 137 (11/08 1003)  Labs: Recent Labs    06/23/2021 0257 06/15/2021 0408 07/08/2021 1233 06/14/2021 1642 06/11/2021 1857 06/22/2021 1905 06/25/2021 2356 06/24/2021 2358 06/18/21 0329 06/18/21 0334 06/18/21 1008 06/18/21 1013 06/18/21 1116 06/18/21 1623 06/18/21 1632  HGB 12.4*   < >  --    < > 10.0*   < > 9.7*   < > 9.6*   < >  --    < > 9.2* 8.7* 9.2*  HCT 43.3   < >  --    < > 34.2*   < > 32.8*   < > 32.5*   < >  --    < > 27.0* 29.6* 27.0*  PLT 157  --   --   --  169  --  167  --  163  --   --   --   --  182  --   APTT  --    < >  --   --  75*  --  52*  --  49*  --  51*  --   --  54*  --   LABPROT  --   --   --   --  20.0*  --   --   --  23.1*  --   --   --   --   --   --   INR  --   --   --   --  1.7*  --   --   --  2.1*  --   --   --   --   --   --   HEPARINUNFRC  --   --  >1.10*  --  >1.10*  --   --   --   --   --   --   --   --   --   --   CREATININE 1.64*  --   --   --  2.79*  --   --   --  2.37*  --   --   --   --   --   --    < > = values in this interval not displayed.     Estimated Creatinine Clearance: 42.8 mL/min (A) (by C-G formula based on SCr of 2.37 mg/dL (H)).   Medical History: Past Medical History:  Diagnosis Date   Anxiety    Atrial fibrillation (HCC)    CHF (congestive heart failure) (HCC)    COPD (chronic obstructive pulmonary disease) (HCC)    Emphysema [[name]]   Depression    Diabetes mellitus without complication (HCC)    type 2   Emphysema of lung (HCC)    GERD (gastroesophageal reflux disease)    HIV disease ([place])    Hypertension     Oxygen deficiency     Assessment: 55 yo M on chronic apixaban for atrial fibrillation. Patient was on heparin in preparation for line placement and possible procedures. Last apixaban dose given on 11/7 @ 0948. Patient cannulated and ECMO initiated on 11/7. Patient received heparin bolus at cannulation, followed by initiation of bivalirudin drip.  Pharmacy consulted for bivalirudin dosing.   aPTT remains subtherapeutic at 54, on bivalirudin 0.07 mg/kg/hr. Hgb 9.2, Plt 182 - stable. Oozing at central line improving - no infusion or circuit issues per nursing.   Goal of Therapy:  aPTT 60 - 80 seconds Monitor platelets by anticoagulation protocol: Yes   Plan:  Increase bivalirudin to 0.08 mg/kg/hr Check aPTT in 4 hours Monitor CBC, aPTT, and for signs/symptoms of bleeding daily  Sherron Monday, PharmD, BCCCP Clinical Pharmacist  Phone: [phone] 06/18/2021 5:07 PM  Please check AMION for all [facility] phone numbers After 10:00 PM, call Main Pharmacy [phone]  ADDENDUM aPTT came back subtherapeutic at 53, on 0.08 mg/kg/hr. No s/sx of bleeding or infusion issues. No issues with the circuit. Increase bivalirudin to 0.095 mg/kg/hr and get level with AM labs.   Sherron Monday, PharmD, BCCCP Clinical Pharmacist

## 2021-06-19 ENCOUNTER — Inpatient Hospital Stay (HOSPITAL_COMMUNITY): Payer: Medicare HMO

## 2021-06-19 DIAGNOSIS — J9602 Acute respiratory failure with hypercapnia: Secondary | ICD-10-CM | POA: Diagnosis not present

## 2021-06-19 DIAGNOSIS — J9601 Acute respiratory failure with hypoxia: Secondary | ICD-10-CM | POA: Diagnosis not present

## 2021-06-19 DIAGNOSIS — J101 Influenza due to other identified influenza virus with other respiratory manifestations: Secondary | ICD-10-CM | POA: Diagnosis not present

## 2021-06-19 LAB — CBC
HCT: 26.7 % — ABNORMAL LOW (ref 39.0–52.0)
HCT: 28.8 % — ABNORMAL LOW (ref 39.0–52.0)
Hemoglobin: 8 g/dL — ABNORMAL LOW (ref 13.0–17.0)
Hemoglobin: 8.8 g/dL — ABNORMAL LOW (ref 13.0–17.0)
MCH: 29.3 pg (ref 26.0–34.0)
MCH: 29.2 pg (ref 26.0–34.0)
MCHC: 30 g/dL (ref 30.0–36.0)
MCHC: 30.6 g/dL (ref 30.0–36.0)
MCV: 97.8 fL (ref 80.0–100.0)
MCV: 95.7 fL (ref 80.0–100.0)
Platelets: 178 10*3/uL (ref 150–400)
Platelets: 159 10*3/uL (ref 150–400)
RBC: 2.73 MIL/uL — ABNORMAL LOW (ref 4.22–5.81)
RBC: 3.01 MIL/uL — ABNORMAL LOW (ref 4.22–5.81)
RDW: 12.6 % (ref 11.5–15.5)
RDW: 15.9 % — ABNORMAL HIGH (ref 11.5–15.5)
WBC: 14.5 10*3/uL — ABNORMAL HIGH (ref 4.0–10.5)
WBC: 13.5 10*3/uL — ABNORMAL HIGH (ref 4.0–10.5)
nRBC: 0.4 % — ABNORMAL HIGH (ref 0.0–0.2)
nRBC: 0.4 % — ABNORMAL HIGH (ref 0.0–0.2)

## 2021-06-19 LAB — POCT I-STAT 7, (LYTES, BLD GAS, ICA,H+H)
Acid-Base Excess: 10 mmol/L — ABNORMAL HIGH (ref 0.0–2.0)
Acid-Base Excess: 10 mmol/L — ABNORMAL HIGH (ref 0.0–2.0)
Acid-Base Excess: 8 mmol/L — ABNORMAL HIGH (ref 0.0–2.0)
Acid-Base Excess: 6 mmol/L — ABNORMAL HIGH (ref 0.0–2.0)
Acid-Base Excess: 5 mmol/L — ABNORMAL HIGH (ref 0.0–2.0)
Bicarbonate: 35.6 mmol/L — ABNORMAL HIGH (ref 20.0–28.0)
Bicarbonate: 36 mmol/L — ABNORMAL HIGH (ref 20.0–28.0)
Bicarbonate: 33.7 mmol/L — ABNORMAL HIGH (ref 20.0–28.0)
Bicarbonate: 32.4 mmol/L — ABNORMAL HIGH (ref 20.0–28.0)
Bicarbonate: 31.7 mmol/L — ABNORMAL HIGH (ref 20.0–28.0)
Calcium, Ion: 1.17 mmol/L (ref 1.15–1.40)
Calcium, Ion: 1.18 mmol/L (ref 1.15–1.40)
Calcium, Ion: 1.19 mmol/L (ref 1.15–1.40)
Calcium, Ion: 1.23 mmol/L (ref 1.15–1.40)
Calcium, Ion: 1.25 mmol/L (ref 1.15–1.40)
HCT: 25 % — ABNORMAL LOW (ref 39.0–52.0)
HCT: 23 % — ABNORMAL LOW (ref 39.0–52.0)
HCT: 26 % — ABNORMAL LOW (ref 39.0–52.0)
HCT: 26 % — ABNORMAL LOW (ref 39.0–52.0)
HCT: 26 % — ABNORMAL LOW (ref 39.0–52.0)
Hemoglobin: 8.5 g/dL — ABNORMAL LOW (ref 13.0–17.0)
Hemoglobin: 7.8 g/dL — ABNORMAL LOW (ref 13.0–17.0)
Hemoglobin: 8.8 g/dL — ABNORMAL LOW (ref 13.0–17.0)
Hemoglobin: 8.8 g/dL — ABNORMAL LOW (ref 13.0–17.0)
Hemoglobin: 8.8 g/dL — ABNORMAL LOW (ref 13.0–17.0)
O2 Saturation: 93 %
O2 Saturation: 95 %
O2 Saturation: 96 %
O2 Saturation: 92 %
O2 Saturation: 89 %
Patient temperature: 36.9
Patient temperature: 36.8
Patient temperature: 36.8
Patient temperature: 36.9
Patient temperature: 98.9
Potassium: 4.7 mmol/L (ref 3.5–5.1)
Potassium: 4.9 mmol/L (ref 3.5–5.1)
Potassium: 5.3 mmol/L — ABNORMAL HIGH (ref 3.5–5.1)
Potassium: 5.5 mmol/L — ABNORMAL HIGH (ref 3.5–5.1)
Potassium: 5.2 mmol/L — ABNORMAL HIGH (ref 3.5–5.1)
Sodium: 147 mmol/L — ABNORMAL HIGH (ref 135–145)
Sodium: 150 mmol/L — ABNORMAL HIGH (ref 135–145)
Sodium: 149 mmol/L — ABNORMAL HIGH (ref 135–145)
Sodium: 148 mmol/L — ABNORMAL HIGH (ref 135–145)
Sodium: 150 mmol/L — ABNORMAL HIGH (ref 135–145)
TCO2: 37 mmol/L — ABNORMAL HIGH (ref 22–32)
TCO2: 38 mmol/L — ABNORMAL HIGH (ref 22–32)
TCO2: 35 mmol/L — ABNORMAL HIGH (ref 22–32)
TCO2: 34 mmol/L — ABNORMAL HIGH (ref 22–32)
TCO2: 33 mmol/L — ABNORMAL HIGH (ref 22–32)
pCO2 arterial: 55.7 mmHg — ABNORMAL HIGH (ref 32.0–48.0)
pCO2 arterial: 55.1 mmHg — ABNORMAL HIGH (ref 32.0–48.0)
pCO2 arterial: 50.1 mmHg — ABNORMAL HIGH (ref 32.0–48.0)
pCO2 arterial: 57.4 mmHg — ABNORMAL HIGH (ref 32.0–48.0)
pCO2 arterial: 57 mmHg — ABNORMAL HIGH (ref 32.0–48.0)
pH, Arterial: 7.413 (ref 7.350–7.450)
pH, Arterial: 7.422 (ref 7.350–7.450)
pH, Arterial: 7.435 (ref 7.350–7.450)
pH, Arterial: 7.359 (ref 7.350–7.450)
pH, Arterial: 7.354 (ref 7.350–7.450)
pO2, Arterial: 67 mmHg — ABNORMAL LOW (ref 83.0–108.0)
pO2, Arterial: 77 mmHg — ABNORMAL LOW (ref 83.0–108.0)
pO2, Arterial: 83 mmHg (ref 83.0–108.0)
pO2, Arterial: 66 mmHg — ABNORMAL LOW (ref 83.0–108.0)
pO2, Arterial: 62 mmHg — ABNORMAL LOW (ref 83.0–108.0)

## 2021-06-19 LAB — PROTIME-INR
INR: 1.7 — ABNORMAL HIGH (ref 0.8–1.2)
Prothrombin Time: 19.8 seconds — ABNORMAL HIGH (ref 11.4–15.2)

## 2021-06-19 LAB — GLUCOSE, CAPILLARY
Glucose-Capillary: 152 mg/dL — ABNORMAL HIGH (ref 70–99)
Glucose-Capillary: 167 mg/dL — ABNORMAL HIGH (ref 70–99)
Glucose-Capillary: 170 mg/dL — ABNORMAL HIGH (ref 70–99)
Glucose-Capillary: 191 mg/dL — ABNORMAL HIGH (ref 70–99)
Glucose-Capillary: 193 mg/dL — ABNORMAL HIGH (ref 70–99)
Glucose-Capillary: 199 mg/dL — ABNORMAL HIGH (ref 70–99)
Glucose-Capillary: 209 mg/dL — ABNORMAL HIGH (ref 70–99)
Glucose-Capillary: 214 mg/dL — ABNORMAL HIGH (ref 70–99)
Glucose-Capillary: 217 mg/dL — ABNORMAL HIGH (ref 70–99)
Glucose-Capillary: 227 mg/dL — ABNORMAL HIGH (ref 70–99)
Glucose-Capillary: 235 mg/dL — ABNORMAL HIGH (ref 70–99)
Glucose-Capillary: 248 mg/dL — ABNORMAL HIGH (ref 70–99)
Glucose-Capillary: 275 mg/dL — ABNORMAL HIGH (ref 70–99)
Glucose-Capillary: 314 mg/dL — ABNORMAL HIGH (ref 70–99)
Glucose-Capillary: 319 mg/dL — ABNORMAL HIGH (ref 70–99)
Glucose-Capillary: 340 mg/dL — ABNORMAL HIGH (ref 70–99)

## 2021-06-19 LAB — CULTURE, RESPIRATORY W GRAM STAIN: Gram Stain: NONE SEEN

## 2021-06-19 LAB — BASIC METABOLIC PANEL
Anion gap: 7 (ref 5–15)
Anion gap: 6 (ref 5–15)
BUN: 104 mg/dL — ABNORMAL HIGH (ref 6–20)
BUN: 97 mg/dL — ABNORMAL HIGH (ref 6–20)
CO2: 33 mmol/L — ABNORMAL HIGH (ref 22–32)
CO2: 31 mmol/L (ref 22–32)
Calcium: 8.2 mg/dL — ABNORMAL LOW (ref 8.9–10.3)
Calcium: 8.3 mg/dL — ABNORMAL LOW (ref 8.9–10.3)
Chloride: 105 mmol/L (ref 98–111)
Chloride: 109 mmol/L (ref 98–111)
Creatinine, Ser: 1.85 mg/dL — ABNORMAL HIGH (ref 0.61–1.24)
Creatinine, Ser: 1.69 mg/dL — ABNORMAL HIGH (ref 0.61–1.24)
GFR, Estimated: 42 mL/min — ABNORMAL LOW (ref >60)
GFR, Estimated: 47 mL/min — ABNORMAL LOW (ref >60)
Glucose, Bld: 215 mg/dL — ABNORMAL HIGH (ref 70–99)
Glucose, Bld: 381 mg/dL — ABNORMAL HIGH (ref 70–99)
Potassium: 4.9 mmol/L (ref 3.5–5.1)
Potassium: 5.6 mmol/L — ABNORMAL HIGH (ref 3.5–5.1)
Sodium: 145 mmol/L (ref 135–145)
Sodium: 146 mmol/L — ABNORMAL HIGH (ref 135–145)

## 2021-06-19 LAB — HEPATIC FUNCTION PANEL
ALT: 33 U/L (ref 0–44)
AST: 112 U/L — ABNORMAL HIGH (ref 15–41)
Albumin: 1.9 g/dL — ABNORMAL LOW (ref 3.5–5.0)
Alkaline Phosphatase: 37 U/L — ABNORMAL LOW (ref 38–126)
Bilirubin, Direct: 0.3 mg/dL — ABNORMAL HIGH (ref 0.0–0.2)
Indirect Bilirubin: 0.9 mg/dL (ref 0.3–0.9)
Total Bilirubin: 1.2 mg/dL (ref 0.3–1.2)
Total Protein: 5.9 g/dL — ABNORMAL LOW (ref 6.5–8.1)

## 2021-06-19 LAB — APTT
aPTT: 52 seconds — ABNORMAL HIGH (ref 24–36)
aPTT: 59 seconds — ABNORMAL HIGH (ref 24–36)
aPTT: 54 seconds — ABNORMAL HIGH (ref 24–36)
aPTT: 55 seconds — ABNORMAL HIGH (ref 24–36)

## 2021-06-19 LAB — PREPARE RBC (CROSSMATCH)

## 2021-06-19 LAB — LACTATE DEHYDROGENASE: LDH: 356 U/L — ABNORMAL HIGH (ref 98–192)

## 2021-06-19 LAB — LACTIC ACID, PLASMA: Lactic Acid, Venous: 0.8 mmol/L (ref 0.5–1.9)

## 2021-06-19 LAB — TRIGLYCERIDES: Triglycerides: 188 mg/dL — ABNORMAL HIGH (ref <150)

## 2021-06-19 LAB — FIBRINOGEN: Fibrinogen: >800 mg/dL — ABNORMAL HIGH (ref 210–475)

## 2021-06-19 MED ORDER — METHYLPREDNISOLONE SODIUM SUCC 40 MG IJ SOLR
40.0000 mg | INTRAMUSCULAR | Status: AC
  Administered 2021-06-20 – 2021-06-22 (×3): 40 mg via INTRAVENOUS
  Filled 2021-06-19 (×3): qty 1

## 2021-06-19 MED ORDER — SODIUM CHLORIDE 0.9 % IV SOLN
2.0000 g | Freq: Three times a day (TID) | INTRAVENOUS | Status: AC
  Administered 2021-06-19 – 2021-07-01 (×38): 2 g via INTRAVENOUS
  Filled 2021-06-19 (×38): qty 2

## 2021-06-19 MED ORDER — INSULIN ASPART 100 UNIT/ML IJ SOLN
3.0000 [IU] | INTRAMUSCULAR | Status: DC
Start: 2021-06-19 — End: 2021-06-19
  Administered 2021-06-19: 9 [IU] via SUBCUTANEOUS

## 2021-06-19 MED ORDER — INSULIN DETEMIR 100 UNIT/ML ~~LOC~~ SOLN
31.0000 [IU] | Freq: Two times a day (BID) | SUBCUTANEOUS | Status: DC
  Administered 2021-06-19: 31 [IU] via SUBCUTANEOUS
  Filled 2021-06-19 (×2): qty 0.31

## 2021-06-19 MED ORDER — DIGOXIN 0.25 MG/ML IJ SOLN
0.2500 mg | INTRAMUSCULAR | Status: AC
  Administered 2021-06-19 (×2): 0.25 mg via INTRAVENOUS
  Filled 2021-06-19 (×2): qty 2

## 2021-06-19 MED ORDER — FENTANYL CITRATE PF 50 MCG/ML IJ SOSY
200.0000 ug | PREFILLED_SYRINGE | Freq: Once | INTRAMUSCULAR | Status: AC
  Administered 2021-06-20: 200 ug via INTRAVENOUS

## 2021-06-19 MED ORDER — VECURONIUM BROMIDE 10 MG IV SOLR
10.0000 mg | Freq: Once | INTRAVENOUS | Status: AC
  Administered 2021-06-20: 10 mg via INTRAVENOUS
  Filled 2021-06-19: qty 10

## 2021-06-19 MED ORDER — SODIUM CHLORIDE 0.9% IV SOLUTION: Freq: Once | INTRAVENOUS | Status: DC

## 2021-06-19 MED ORDER — SODIUM CHLORIDE 0.9% IV SOLUTION: Freq: Once | INTRAVENOUS | Status: AC

## 2021-06-19 MED ORDER — MIDAZOLAM HCL 2 MG/2ML IJ SOLN
5.0000 mg | Freq: Once | INTRAMUSCULAR | Status: AC
  Administered 2021-06-20: 5 mg via INTRAVENOUS
  Filled 2021-06-19: qty 6

## 2021-06-19 MED ORDER — QUETIAPINE FUMARATE 50 MG PO TABS
50.0000 mg | ORAL_TABLET | Freq: Two times a day (BID) | ORAL | Status: DC
  Administered 2021-06-19 – 2021-06-22 (×7): 50 mg
  Filled 2021-06-19 (×8): qty 1

## 2021-06-19 MED ORDER — PROSOURCE TF PO LIQD
90.0000 mL | Freq: Three times a day (TID) | ORAL | Status: DC
  Administered 2021-06-19 – 2021-06-25 (×17): 90 mL
  Filled 2021-06-19 (×18): qty 90

## 2021-06-19 MED ORDER — METOPROLOL TARTRATE 25 MG/10 ML ORAL SUSPENSION
25.0000 mg | Freq: Two times a day (BID) | ORAL | Status: DC
Start: 2021-06-19 — End: 2021-06-19

## 2021-06-19 MED ORDER — DIGOXIN 125 MCG PO TABS: 0.1250 mg | ORAL_TABLET | Freq: Every day | ORAL | Status: DC

## 2021-06-19 NOTE — Progress Notes (Signed)
ANTICOAGULATION CONSULT NOTE   Pharmacy Consult for Bivalirudin Indication:  ECMO and atrial fibrillation  Allergies  Allergen Reactions   Bactrim [Sulfamethoxazole-Trimethoprim] Hives    Patient Measurements: Height: 5\' 9"  (175.3 cm) Weight: 106.9 kg (235 lb 10.8 oz) IBW/kg (Calculated) : 70.7  Vital Signs: Temp: 98.2 F (36.8 C) (11/09 1600) Temp Source: Oral (11/09 1200) BP: 116/53 (11/09 1040)  Labs: Recent Labs    06/22/2021 1233 07/04/2021 1642 06/26/2021 1857 07/09/2021 1905 06/18/21 0329 06/18/21 0334 06/18/21 1623 06/18/21 1632 06/18/21 2014 06/18/21 2020 06/19/21 0312 06/19/21 0317 06/19/21 0900 06/19/21 1342 06/19/21 1413 06/19/21 1541 06/19/21 1551 06/19/21 1857 06/19/21 1958  HGB  --    < > 10.0*   < > 9.6*   < > 8.7*   < > 8.2*   < > 8.0*   < >  --   --    < > 8.8* 8.8*  --  8.8*  HCT  --    < > 34.2*   < > 32.5*   < > 29.6*   < > 28.2*   < > 26.7*   < >  --   --    < > 28.8* 26.0*  --  26.0*  PLT  --   --  169   < > 163  --  182  --  171  --  178  --   --   --   --  159  --   --   --   APTT  --   --  75*   < > 49*   < > 54*  --  53*  --  52*  --  59* 54*  --   --   --  55*  --   LABPROT  --   --  20.0*  --  23.1*  --   --   --   --   --  19.8*  --   --   --   --   --   --   --   --   INR  --   --  1.7*  --  2.1*  --   --   --   --   --  1.7*  --   --   --   --   --   --   --   --   HEPARINUNFRC >1.10*  --  >1.10*  --   --   --   --   --   --   --   --   --   --   --   --   --   --   --   --   CREATININE  --   --  2.79*  --  2.37*  --  2.15*  --   --   --  1.85*  --   --   --   --  1.69*  --   --   --    < > = values in this interval not displayed.    Estimated Creatinine Clearance: 59.5 mL/min (A) (by C-G formula based on SCr of 1.69 mg/dL (H)).   Medical History: Past Medical History:  Diagnosis Date   Anxiety    Atrial fibrillation (HCC)    CHF (congestive heart failure) (HCC)    COPD (chronic obstructive pulmonary disease) (HCC)    Emphysema  [[name]]   Depression    Diabetes mellitus without complication (HCC)    type  2   Emphysema of lung (HCC)    GERD (gastroesophageal reflux disease)    HIV disease (HCC)    Hypertension    Oxygen deficiency     Assessment: 55 Y/O male on chronic apixaban for atrial fibrillation. Previous placed on heparin in anticipation of line placement and possible procedures.  Last apixaban dose given @0948  11/7. Patient cannulated and ECMO initiated 11/7; received a heparin bolus at cannulation, then switched to bivalrudin infusion. Pharmacy to dose bivalirudin.  aPTT is slightly below goal at 55 after increase to bivalrudin 0.16 mg/kg/hr. CBC stable - hgb low 8's, plt normal.   Goal of Therapy:  aPTT 60-80 secs Monitor platelets by anticoagulation protocol: Yes   Plan:  Increase bivalirudin to 0.18 mg/kg/hr Check aPTT in 4 hours  13/7, PharmD PGY1 Pharmacy Resident 06/19/2021  8:12 PM  Please check [email or web address] for unit-specific pharmacy phone numbers.

## 2021-06-19 NOTE — Procedures (Signed)
Cortrak ° °Tube Type:  Cortrak - 43 inches °Tube Location:  Right nare °Initial Placement:  Postpyloric °Secured by: Bridle °Technique Used to Measure Tube Placement:  Marking at nare/corner of mouth °Cortrak Secured At:  100 cm ° °Cortrak Tube Team Note: ° °Consult received to place a Cortrak feeding tube.  ° °X-ray is required, abdominal x-ray has been ordered by the Cortrak team. Please confirm tube placement before using the Cortrak tube.  ° °If the tube becomes dislodged please keep the tube and contact the Cortrak team at [email or web address] ([email or web address]) for replacement.  °If after hours and replacement cannot be delayed, place a NG tube and confirm placement with an abdominal x-ray.  ° ° °Reeta Kuk MS, RD, LDN °Please refer to AMION for RD and/or RD on-call/weekend/after hours pager ° ° °

## 2021-06-19 NOTE — Progress Notes (Signed)
Patient ID: Chris [name]., male   DOB: 1966-01-22, 55 y.o.   MRN: [id number]     Advanced Heart Failure Rounding Note  PCP-Cardiologist: Glenetta Hew, MD   Subjective:    11/7: VV ECMO cannulation  This morning, patient is on NE 3 with MAP 70s.  HR 90s in atrial fibrillation on amiodarone gtt.   Afebrile, on cefepime/vancomycin.  Oseltamivir for influenza completed.  Solumedrol with severe COPD.   AKI with creatinine 2.37 => 1.85, BUN very high.  UOP 2630, urine appears clear in foley. Weight lower.   FiO2 0.4 on vent, Vt 200 cc.  Decreased pH with slight sweep wean yesterday.   Patient wakes up with sedation wean.   TFs ongoing.   ECMO: 3400 rpm Flow 4.5 Pvenous -94 DeltaP 25 Sweep 4 ABG 7.41/56/67/93% Current sat 96% PTT 52 (goal 60-80), on bivalirudin Lactate 1.6 => 0.8 LDH 323 => 356  CXR: bibasilar infiltrates, stable cannula position   Objective:   Weight Range: 106.9 kg Body mass index is 34.8 kg/m.   Vital Signs:   Temp:  [98.2 F (36.8 C)-98.8 F (37.1 C)] 98.4 F (36.9 C) (11/09 0400) Pulse Rate:  [87-137] 87 (11/08 1933) Resp:  [0-31] 18 (11/09 0700) BP: (113-122)/(55-62) 122/62 (11/09 0400) SpO2:  [77 %-100 %] 96 % (11/09 0741) Arterial Line BP: (90-133)/(48-65) 111/56 (11/09 0700) FiO2 (%):  [40 %-60 %] 40 % (11/09 0741) Weight:  [106.9 kg] 106.9 kg (11/09 0500) Last BM Date: 06/12/21  Weight change: Filed Weights   06/26/2021 0500 06/18/21 0500 06/19/21 0500  Weight: 112.9 kg 108.8 kg 106.9 kg    Intake/Output:   Intake/Output Summary (Last 24 hours) at 06/19/2021 0742 Last data filed at 06/19/2021 0700 Gross per 24 hour  Intake 3697.61 ml  Output 2630 ml  Net 1067.61 ml      Physical Exam    General: Intubated Neck: Crescent catheter right neck and CVL left neck, no thyromegaly or thyroid nodule.  Lungs: Decreased BS at bases.  CV: Nondisplaced PMI.  Heart irregular S1/S2, no S3/S4, no murmur.  No peripheral edema.    Abdomen: Soft, nontender, no hepatosplenomegaly, mild distention.  Skin: Intact without lesions or rashes.  Neurologic: Sedated but will awaken.  Extremities: No clubbing or cyanosis.  HEENT: Normal.   Telemetry   Atrial fibrillation 90s (personally reviewed)  Labs    CBC Recent Labs    07/08/2021 0257 07/04/2021 0408 06/18/21 2014 06/18/21 2020 06/19/21 0312 06/19/21 0317  WBC 7.9   < > 13.4*  --  14.5*  --   NEUTROABS 6.3  --   --   --   --   --   HGB 12.4*   < > 8.2*   < > 8.0* 8.5*  HCT 43.3   < > 28.2*   < > 26.7* 25.0*  MCV 100.5*   < > 99.3  --  97.8  --   PLT 157   < > 171  --  178  --    < > = values in this interval not displayed.   Basic Metabolic Panel Recent Labs    06/12/2021 1857 06/14/2021 1905 06/18/21 0329 06/18/21 0334 06/18/21 1623 06/18/21 1632 06/19/21 0312 06/19/21 0317  NA 140   < > 140   < > 142   < > 145 147*  K 5.5*   < > 5.1   < > 5.6*   < > 4.9 4.7  CL 98  --  97*  --  100  --  105  --   CO2 30  --  34*  --  34*  --  33*  --   GLUCOSE 239*  --  345*  --  160*  --  215*  --   BUN 86*  --  85*  --  103*  --  104*  --   CREATININE 2.79*  --  2.37*  --  2.15*  --  1.85*  --   CALCIUM 8.0*  --  7.8*  --  7.8*  --  8.2*  --   MG 2.9*  --  3.1*  --   --   --   --   --    < > = values in this interval not displayed.   Liver Function Tests Recent Labs    06/18/21 0329 06/19/21 0312  AST 115* 112*  ALT 31 33  ALKPHOS 33* 37*  BILITOT 1.4* 1.2  PROT 5.8* 5.9*  ALBUMIN 2.1* 1.9*   No results for input(s): LIPASE, AMYLASE in the last 72 hours. Cardiac Enzymes No results for input(s): CKTOTAL, CKMB, CKMBINDEX, TROPONINI in the last 72 hours.  BNP: BNP (last 3 results) Recent Labs    10/29/20 1214  BNP 102.9*    ProBNP (last 3 results) No results for input(s): PROBNP in the last 8760 hours.   D-Dimer No results for input(s): DDIMER in the last 72 hours. Hemoglobin A1C No results for input(s): HGBA1C in the last 72  hours. Fasting Lipid Panel Recent Labs    06/19/21 0312  TRIG 188*   Thyroid Function Tests No results for input(s): TSH, T4TOTAL, T3FREE, THYROIDAB in the last 72 hours.  Invalid input(s): FREET3  Other results:   Imaging    No results found.   Medications:     Scheduled Medications:  albuterol  5 mg Nebulization Once   budesonide (PULMICORT) nebulizer solution  0.25 mg Nebulization BID   chlorhexidine gluconate (MEDLINE KIT)  15 mL Mouth Rinse BID   Chlorhexidine Gluconate Cloth  6 each Topical Daily   clonazePAM  1 mg Per Tube BID   emtricitabine-tenofovir AF  1 tablet Per Tube Daily   And   dolutegravir  50 mg Per Tube Daily   feeding supplement (PROSource TF)  45 mL Per Tube QID   ipratropium-albuterol  3 mL Nebulization Q6H   mouth rinse  15 mL Mouth Rinse 10 times per day   methylPREDNISolone (SOLU-MEDROL) injection  40 mg Intravenous Q12H   metoprolol tartrate  25 mg Per Tube BID   mupirocin ointment  1 application Nasal BID   oxyCODONE  5 mg Per Tube Q6H   pantoprazole (PROTONIX) IV  40 mg Intravenous QHS   polyethylene glycol  17 g Per Tube BID   QUEtiapine  25 mg Per Tube BID   rosuvastatin  20 mg Per Tube Daily   senna-docusate  1 tablet Per Tube BID   sodium chloride flush  10-40 mL Intracatheter Q12H   sodium chloride flush  3 mL Intravenous Q12H    Infusions:  sodium chloride     sodium chloride 10 mL/hr at 06/23/2021 1502   sodium chloride     sodium chloride 20 mL/hr at 06/19/21 0700   sodium chloride     amiodarone 30 mg/hr (06/19/21 0700)   bivalirudin (ANGIOMAX) infusion 0.5 mg/mL (Non-ACS indications) 0.13 mg/kg/hr (06/19/21 0700)   ceFEPime (MAXIPIME) IV Stopped (06/19/21 0532)   dexmedetomidine (PRECEDEX) IV infusion 0.6 mcg/kg/hr (06/19/21 0700)  feeding supplement (VITAL 1.5 CAL) 35 mL/hr at 06/19/21 0130   fentaNYL infusion INTRAVENOUS 200 mcg/hr (06/19/21 0700)   insulin 6.5 Units/hr (06/19/21 0700)   midazolam Stopped (06/18/21  1255)   norepinephrine (LEVOPHED) Adult infusion 3 mcg/min (06/19/21 0700)   vancomycin Stopped (06/18/21 1446)    PRN Medications: sodium chloride, Place/Maintain arterial line **AND** sodium chloride, acetaminophen **OR** acetaminophen, albuterol, bisacodyl, dextrose, docusate, fentaNYL, ondansetron **OR** ondansetron (ZOFRAN) IV, sodium chloride flush, sodium chloride flush   Assessment/Plan   1. Acute hypoxemic and hypercarbic respiratory failure: In setting of COPD on home oxygen at baseline.  He has severe influenza infection.  Refractory hypoxemia and hypercarbia with maximal management.  He has underlying significant lung disease but has reasonable quality of life and had ARDS in the past requiring tracheostomy but was able to wean off.  RESP score is acceptable.  VV ECMO cannulation on 11/7.  This morning, lactate is 0.8.  ABG 7.41/56/67/93% with sats currently 96%.  Respiratory acidosis resolved.  PaO2 remains low but sats consistently in 90s and improved PaCO2 (respiratory acidosis drove initiation of ECMO).  Sweep is at 4, unable to wean sweep yesterday.  Cannula position stable on CXR, bibasilar infiltrates present.  LDH ok.   - Continue current ECMO support, can try incremental sweep wean today.  - Increase bivalirudin for PTT 60-80.  - No diuresis today with adequate UOP and markedly high BUN.  - Antibiotic coverage with cefepime, vancomycin.  - Solumedrol for severe COPD.  2. Atrial fibrillation: He has been in atrial fibrillation on all ECGs since 2021, suspect chronic at this point. On Eliquis at home.  - Will manage on bivalirudin.  - Amiodarone gtt for rate control.  - Consider cardioversion in future.  3.  AKI: Creatinine 1.6 => 2.37 => 1.85.  BUN is markedly high.  Urine looks clear, LDH not significantly elevated.  - Follow for now, no diuresis today.  4. Type 2 DM: SSI.  5. Influenza: Severe.  Completed oseltamivir.  6. COPD: Still smokes 1-2 cigs/day.  Intermittent  home oxygen.  Followed by pulmonary, has been stable at home for years.  7. HIV: Last viral load was undetectable.  On treatment.  8. Shock: Suspect primarily septic shock.  Echo showed EF 55-60% with normal RV.  - Continue norepinephrine, currently at 3, titrate down as able.  9. FEN: Tube feeds.  10. Heme: hgb 8 today, will give 1 unit PRBCs.   CRITICAL CARE Performed by: Loralie Champagne  Total critical care time: 40 minutes  Critical care time was exclusive of separately billable procedures and treating other patients.  Critical care was necessary to treat or prevent imminent or life-threatening deterioration.  Critical care was time spent personally by me on the following activities: development of treatment plan with patient and/or surrogate as well as nursing, discussions with consultants, evaluation of patient's response to treatment, examination of patient, obtaining history from patient or surrogate, ordering and performing treatments and interventions, ordering and review of laboratory studies, ordering and review of radiographic studies, pulse oximetry and re-evaluation of patient's condition.     Length of Stay: 6  Loralie Champagne, MD  06/19/2021, 7:42 AM  Advanced Heart Failure Team Pager [phone] (M-F; 7a - 5p)  Please contact [facility] for night-coverage after hours (5p -7a ) and weekends on [email or web address]

## 2021-06-19 NOTE — Progress Notes (Signed)
Palliative:  HPI: 55 y.o. male  with past medical history of atrial fibrillation, CHF, HTN, severe COPD, h/o ARDS requiring trach but has since been decannulated, diabetes, HIV admitted on 06/16/2021 with fever, shortness of breath, cough worsening over past 3 days. +Influenza A. Required intubation 07/09/2021 and cannulation for initiation of VV ECMO 07/01/2021. Noted past palliative care involvement with previous hospital stay in 2019 when he required trach and CRRT.  I met today at Markeise's bedside with wife, Varney Biles. Varney Biles has just recently come to the hospital today. We briefly discussed Chris [name]'s status and RN is trying to come down from his sedation to see if we can get him more awake. Varney Biles continues to agree with tracheostomy as indicated if this is what the medical team feels will be best for him. Goals remain clear to continue with full scope treatment to give him an opportunity "to fight." Varney Biles shares more with [place] about [place] and his friends and family and his vast variety of nicknames from all his friends and family. He is very loved.   Much of our conversation today focused on Keisha and self care. We discussed the likely long nature of Evian's illness and that she needs to continue to care for herself. She talks of trying to be strong and put on a brave face and I encouraged her to find at least one person or times when she can let her guard down and be able to cry or be sad or mad when she needs too and not to hold all this inside and carry it around each day.   All questions/concerns addressed. Emotional support provided.    Exam: Still sedated but attempts being made to try and wake him up. Breathing appears more labored as sedation coming down. HR reg rate irregular Afib. Abd distended but soft. VV ECMO and vent support.   Plan: - Continue full scope treatment with hopes of eventual/slow improvements over time.   15 min  Vinie Sill, NP Palliative Medicine Team Pager  [phone] (Please see [email or web address] for schedule) Team Phone [phone]    Greater than 50%  of this time was spent counseling and coordinating care related to the above assessment and plan

## 2021-06-19 NOTE — Progress Notes (Signed)
Nutrition Follow-up  DOCUMENTATION CODES:   Obesity unspecified  INTERVENTION:   Tube feeding via postpyloric Cortrak: Vital 1.5 at 45 ml/h, increasing by 10 ml every 8 hours to goal rate of 65 ml/h x 21 hours (holding 2 hours before and 1 hour after Tivicay dose). Prosource TF 90 ml TID.  Provides 2288 kcal, 158 gm protein, 1043 ml free water daily.  NUTRITION DIAGNOSIS:   Inadequate oral intake related to inability to eat as evidenced by NPO status.  Ongoing  GOAL:   Provide needs based on ASPEN/SCCM guidelines  Progressing  MONITOR:   Vent status, TF tolerance, Labs  REASON FOR ASSESSMENT:   Ventilator, Consult Enteral/tube feeding initiation and management  ASSESSMENT:   55 yo male admitted with respiratory distress r/t influenza A. Required intubation on 11/3. PMH includes COPD, on 2 L oxygen at baseline, A fib, HF, HTN, DM-2, HIV.  Patient initiated on VV ECMO 11/7. Small bore feeding tube placed at bedside by MD 11/7.  Cortrak team advanced tube tip to postpyloric; tip is now in the proximal small bowel near the LOT per x-ray.   Vital 1.5 TF was increased to 45 ml/h this morning. Tolerating rate advancement well per discussion with RN. Goal rate is 65 ml/h.   Insulin drip has been stopped, SSI and basal insulin have been added.   No BM since 11/2. Bowel regimen ordered 11/8.  Patient remains intubated on ventilator support, plans for trach placement in the near future.  MV: 6 L/min Temp (24hrs), Avg:98.2 F (36.8 C), Min:98 F (36.7 C), Max:98.4 F (36.9 C)   Labs reviewed. Na 147 CBG: [phone]  Medications reviewed and include Tivicay, Novolog, Levemir, Solumedrol, Protonix, Miralax, Senokot-S, Levophed, Precedex, Fentanyl.  I/O +1,344 ml since admission. UOP 2630 ml x 24 hours  Diet Order:   Diet Order             Diet NPO time specified  Diet effective now                   EDUCATION NEEDS:   Not appropriate for  education at this time  Skin:  Skin Assessment: Reviewed RN Assessment  Last BM:  11/2  Height:   Ht Readings from Last 1 Encounters:  06/25/2021 5\' 9"  (1.753 m)    Weight:   Wt Readings from Last 1 Encounters:  06/19/21 106.9 kg    BMI:  Body mass index is 34.8 kg/m.  Estimated Nutritional Needs:   Kcal:  13/09/22  Protein:  145-155 gm  Fluid:  >/= 2 L    [id number], RD, LDN, CNSC Please refer to Amion for contact information.

## 2021-06-19 NOTE — Progress Notes (Signed)
Patient ID: Chris Rivers., male   DOB: 1966-01-04, 55 y.o.   MRN: [id number]  Extracorporeal support note     ECLS support day: 2 Indication: Severe COPD with influenza A   Configuration: VV ECMO, Crescent catheter right IJ   Drainage cannula: Right IJ Crescent Return cannula: Right IJ Crescent   Pump speed: 3400 Pump flow: 4.5 Pump used: Cardiohelp   Sweep gas: 4   Circuit check: Small area of thrombus 3 o'clock  Anticoagulant: bivalirudin Anticoagulation target: PTT 60-80   Changes in support: Try to drop sweep incrementally   Anticipated goals/duration of support: wean to decannulation  Chris [name] 06/19/2021 7:41 AM

## 2021-06-19 NOTE — Progress Notes (Signed)
ANTICOAGULATION CONSULT NOTE   Pharmacy Consult for Bivalirudin Indication:  ECMO and atrial fibrillation  Allergies  Allergen Reactions   Bactrim [Sulfamethoxazole-Trimethoprim] Hives    Patient Measurements: Height: 5\' 9"  (175.3 cm) Weight: 108.8 kg (239 lb 13.8 oz) IBW/kg (Calculated) : 70.7  Vital Signs: Temp: 98.4 F (36.9 C) (11/09 0400) Temp Source: Core (11/09 0400) BP: 122/62 (11/09 0400) Pulse Rate: 87 (11/08 1933)  Labs: Recent Labs    2021-07-04 1233 July 04, 2021 1642 07-04-21 1857 04-Jul-2021 1905 06/18/21 0329 06/18/21 0334 06/18/21 1623 06/18/21 1632 06/18/21 2014 06/18/21 2020 06/19/21 0312 06/19/21 0317  HGB  --    < > 10.0*   < > 9.6*   < > 8.7*   < > 8.2* 8.8* 8.0* 8.5*  HCT  --    < > 34.2*   < > 32.5*   < > 29.6*   < > 28.2* 26.0* 26.7* 25.0*  PLT  --   --  169   < > 163  --  182  --  171  --  178  --   APTT  --   --  75*   < > 49*   < > 54*  --  53*  --  52*  --   LABPROT  --   --  20.0*  --  23.1*  --   --   --   --   --  19.8*  --   INR  --   --  1.7*  --  2.1*  --   --   --   --   --  1.7*  --   HEPARINUNFRC >1.10*  --  >1.10*  --   --   --   --   --   --   --   --   --   CREATININE  --   --  2.79*  --  2.37*  --  2.15*  --   --   --  1.85*  --    < > = values in this interval not displayed.     Estimated Creatinine Clearance: 54.8 mL/min (A) (by C-G formula based on SCr of 1.85 mg/dL (H)).   Medical History: Past Medical History:  Diagnosis Date   Anxiety    Atrial fibrillation (HCC)    CHF (congestive heart failure) (HCC)    COPD (chronic obstructive pulmonary disease) (HCC)    Emphysema [[name]]   Depression    Diabetes mellitus without complication (HCC)    type 2   Emphysema of lung (HCC)    GERD (gastroesophageal reflux disease)    HIV disease (HCC)    Hypertension    Oxygen deficiency     Assessment: 55 Y/O male on chronic apixaban for atrial fibrillation. Was on heparin in preparation for line placement and possible  procedures. Last apixaban dose given @0948  11/7. Patient cannulated and ECMO initiated this afternoon.  Switched from heparin infusion to bivalirudin.  Post cannulation aPTT 75 seconds - will start bivalirudin  11/9 AM update:  aPTT remains low at 52 Fibrinogen >800 LDH 323>>356  Goal of Therapy:  aPTT 60-80 secs Monitor platelets by anticoagulation protocol: Yes   Plan:  Inc bivalirudin to 0.13 mg/kg/hr Check aPTT in 4 hours  13/7, PharmD, BCPS Clinical Pharmacist Phone: [phone]

## 2021-06-19 NOTE — Progress Notes (Signed)
ANTICOAGULATION CONSULT NOTE   Pharmacy Consult for Bivalirudin Indication:  ECMO and atrial fibrillation  Allergies  Allergen Reactions   Bactrim [Sulfamethoxazole-Trimethoprim] Hives    Patient Measurements: Height: 5\' 9"  (175.3 cm) Weight: 106.9 kg (235 lb 10.8 oz) IBW/kg (Calculated) : 70.7  Vital Signs: Temp: 98.1 F (36.7 C) (11/09 1055) Temp Source: Oral (11/09 1040) BP: 116/53 (11/09 1040)  Labs: Recent Labs    06/22/2021 1233 07/04/2021 1642 07/10/2021 1857 06/16/2021 1905 06/18/21 0329 06/18/21 0334 06/18/21 1623 06/18/21 1632 06/18/21 2014 06/18/21 2020 06/19/21 0312 06/19/21 0317 06/19/21 0814 06/19/21 0900  HGB  --    < > 10.0*   < > 9.6*   < > 8.7*   < > 8.2*   < > 8.0* 8.5* 7.8*  --   HCT  --    < > 34.2*   < > 32.5*   < > 29.6*   < > 28.2*   < > 26.7* 25.0* 23.0*  --   PLT  --   --  169   < > 163  --  182  --  171  --  178  --   --   --   APTT  --   --  75*   < > 49*   < > 54*  --  53*  --  52*  --   --  59*  LABPROT  --   --  20.0*  --  23.1*  --   --   --   --   --  19.8*  --   --   --   INR  --   --  1.7*  --  2.1*  --   --   --   --   --  1.7*  --   --   --   HEPARINUNFRC >1.10*  --  >1.10*  --   --   --   --   --   --   --   --   --   --   --   CREATININE  --   --  2.79*  --  2.37*  --  2.15*  --   --   --  1.85*  --   --   --    < > = values in this interval not displayed.     Estimated Creatinine Clearance: 54.4 mL/min (A) (by C-G formula based on SCr of 1.85 mg/dL (H)).   Medical History: Past Medical History:  Diagnosis Date   Anxiety    Atrial fibrillation (HCC)    CHF (congestive heart failure) (HCC)    COPD (chronic obstructive pulmonary disease) (HCC)    Emphysema [[name]]   Depression    Diabetes mellitus without complication (HCC)    type 2   Emphysema of lung (HCC)    GERD (gastroesophageal reflux disease)    HIV disease (HCC)    Hypertension    Oxygen deficiency     Assessment: 55 Y/O male on chronic apixaban for atrial  fibrillation. Previous placed on heparin in anticipation of line placement and possible procedures.  Last apixaban dose given @0948  11/7. Patient cannulated and ECMO initiated 11/7; received a heparin bolus at cannulation, then switched to bivalrudin infusion. Pharmacy to dose bivalirudin.  aPTT is slightly below goal at 59 on bivalrudin 0.13 mg/kg/hr. CBC stable - hgb low 8's, plt normal.   PM update 11/9 aPTT 54 sec after incr to 0.14 mg/kg/hr  Goal of Therapy:  aPTT 60-80 secs Monitor platelets by anticoagulation protocol: Yes   Plan:  Inc bivalirudin to 0.16 mg/kg/hr Check aPTT in 4 hours  Filbert Schilder, PharmD PGY1 Pharmacy Resident 06/19/2021  2:46 PM  Please check [email or web address] for unit-specific pharmacy phone numbers.

## 2021-06-19 NOTE — Progress Notes (Signed)
Inpatient Diabetes Program Recommendations  AACE/[name]: New Consensus Statement on Inpatient Glycemic Control (2015)  Target Ranges:  Prepandial:   less than 140 mg/dL      Peak postprandial:   less than 180 mg/dL (1-2 hours)      Critically ill patients:  140 - 180 mg/dL   Lab Results  Component Value Date   GLUCAP 170 (H) 06/19/2021   HGBA1C 6.8 (H) 06/28/2021    Review of Glycemic Control Results for JIANNI, BATTEN (MRN [id number]) as of 06/19/2021 10:03  Ref. Range 06/19/2021 04:19 06/19/2021 05:17 06/19/2021 06:22 06/19/2021 08:10 06/19/2021 09:03  Glucose-Capillary Latest Ref Range: 70 - 99 mg/dL 409 (H) 811 (H) 914 (H) 152 (H) 170 (H)   Diabetes history: DM 2 Outpatient Diabetes medications:  Metformin 1000 mg bid, Victoza 1.8 mg daily Current orders for Inpatient glycemic control:  Levemir 31 units bid, Novolog 3-6-9 units q 4 hours Vital 65 ml/hr Solumedrol 40 mg q 24 hours Inpatient Diabetes Program Recommendations:    Please add Novolog 5 units q 4 hours to cover tube feeds.   Thanks,  Beryl Meager, RN, BC-ADM Inpatient Diabetes Coordinator Pager [phone]  (8a-5p)

## 2021-06-19 NOTE — Progress Notes (Signed)
PT Cancellation Note  Patient Details Name: Chris [name]. MRN: [id number] DOB: 03-06-1966   Cancelled Treatment:    Reason Eval/Treat Not Completed: Patient not medically ready. Sedation being weaned and plans to move pt to Kreg tilt bed this PM. Will plan to eval pt tomorrow AM.   Angelina Ok [facility] 06/19/2021, 9:28 AM Skip Mayer PT Acute Rehabilitation Services Pager [phone] Office [phone]

## 2021-06-19 NOTE — Progress Notes (Signed)
ANTICOAGULATION CONSULT NOTE   Pharmacy Consult for Bivalirudin Indication:  ECMO and atrial fibrillation  Allergies  Allergen Reactions   Bactrim [Sulfamethoxazole-Trimethoprim] Hives    Patient Measurements: Height: 5\' 9"  (175.3 cm) Weight: 106.9 kg (235 lb 10.8 oz) IBW/kg (Calculated) : 70.7  Vital Signs: Temp: 98.4 F (36.9 C) (11/09 0400) Temp Source: Core (11/09 0400) BP: 122/62 (11/09 0400) Pulse Rate: 87 (11/08 1933)  Labs: Recent Labs    07/02/2021 1233 07/10/2021 1642 07/08/2021 1857 06/15/2021 1905 06/18/21 0329 06/18/21 0334 06/18/21 1623 06/18/21 1632 06/18/21 2014 06/18/21 2020 06/19/21 0312 06/19/21 0317  HGB  --    < > 10.0*   < > 9.6*   < > 8.7*   < > 8.2* 8.8* 8.0* 8.5*  HCT  --    < > 34.2*   < > 32.5*   < > 29.6*   < > 28.2* 26.0* 26.7* 25.0*  PLT  --   --  169   < > 163  --  182  --  171  --  178  --   APTT  --   --  75*   < > 49*   < > 54*  --  53*  --  52*  --   LABPROT  --   --  20.0*  --  23.1*  --   --   --   --   --  19.8*  --   INR  --   --  1.7*  --  2.1*  --   --   --   --   --  1.7*  --   HEPARINUNFRC >1.10*  --  >1.10*  --   --   --   --   --   --   --   --   --   CREATININE  --   --  2.79*  --  2.37*  --  2.15*  --   --   --  1.85*  --    < > = values in this interval not displayed.     Estimated Creatinine Clearance: 54.4 mL/min (A) (by C-G formula based on SCr of 1.85 mg/dL (H)).   Medical History: Past Medical History:  Diagnosis Date   Anxiety    Atrial fibrillation (HCC)    CHF (congestive heart failure) (HCC)    COPD (chronic obstructive pulmonary disease) (HCC)    Emphysema [[name]]   Depression    Diabetes mellitus without complication (HCC)    type 2   Emphysema of lung (HCC)    GERD (gastroesophageal reflux disease)    HIV disease (HCC)    Hypertension    Oxygen deficiency     Assessment: 55 Y/O male on chronic apixaban for atrial fibrillation. Previous placed on heparin in anticipation of line placement and  possible procedures.  Last apixaban dose given @0948  11/7. Patient cannulated and ECMO initiated 11/7; received a heparin bolus at cannulation, then switched to bivalrudin infusion. Pharmacy to dose bivalirudin.  aPTT is slightly below goal at 59 on bivalrudin 0.13 mg/kg/hr. CBC stable - hgb low 8's, plt normal.   Goal of Therapy:  aPTT 60-80 secs Monitor platelets by anticoagulation protocol: Yes   Plan:  Inc bivalirudin slightly to 0.14 mg/kg/hr Check aPTT in 4 hours  13/7, PharmD PGY1 Pharmacy Resident 06/19/2021  5:55 AM  Please check [email or web address] for unit-specific pharmacy phone numbers.

## 2021-06-19 NOTE — Progress Notes (Signed)
NAME:  Chris Ashford., MRN:  [id number], DOB:  Dec 21, 1965, LOS: 6 ADMISSION DATE:  07/01/2021, CONSULTATION DATE:  06/15/2019 REFERRING MD:  Gaynell Face, CHIEF COMPLAINT:  hypercapnic respiratory failure   History of Present Illness:   Chris [name]. is an 55 y.o. male.  HPI: 55 year old man with a significant history of pre-existing Gold stage III severe COPD for over 10 years on intermittent home oxygen.  More dependent on oxygen recently but apparently good quality of life nonetheless.  Patient is as active as a breathing allows.  HIV disease with undetectable viral load, compliant with HART.  Last CD4 count 350.   Previously admitted 2019 with ARDS secondary to severe influenza.  Required tracheostomy at that time but was successfully decannulated and returned to baseline level of function.   Presented 11/3 with increasing shortness of breath and fevers.  Was initially started on BiPAP and found to be influenza A positive.  Eventually required intubation 11/3.   Consulted for ECMO 11/4 for refractory hypoxia and hypercapnia.  Pertinent  Medical History   Past Medical History:  Diagnosis Date   Anxiety    Atrial fibrillation (HCC)    CHF (congestive heart failure) (HCC)    COPD (chronic obstructive pulmonary disease) (HCC)    Emphysema [[name]]   Depression    Diabetes mellitus without complication (HCC)    type 2   Emphysema of lung (HCC)    GERD (gastroesophageal reflux disease)    HIV disease (HCC)    Hypertension    Oxygen deficiency      Significant Hospital Events: Including procedures, antibiotic start and stop dates in addition to other pertinent events   11/7- VV ECMO started 11/9 - weaning sedation. Transfused 2 units PRBC to keep O2 carrying capacity.   Interim History / Subjective:  Hypotension with metoprolol for heart rate.   Objective   Blood pressure 122/62, pulse 87, temperature 98.4 F (36.9 C), temperature source Core, resp. rate 18, height 5\' 9"   (1.753 m), weight 106.9 kg, SpO2 96 %.    Vent Mode: PCV FiO2 (%):  [40 %] 40 % Set Rate:  [18 bmp] 18 bmp PEEP:  [10 cmH20] 10 cmH20 Plateau Pressure:  [15 cmH20-18 cmH20] 17 cmH20   Intake/Output Summary (Last 24 hours) at 06/19/2021 0810 Last data filed at 06/19/2021 0700 Gross per 24 hour  Intake 3600.47 ml  Output 2500 ml  Net 1100.47 ml    Filed Weights   Jun 21, 2021 0500 06/18/21 0500 06/19/21 0500  Weight: 112.9 kg 108.8 kg 106.9 kg    Examination: Constitutional:      Appearance: He is obese.     Interventions: He is sedated and intubated.  HENT:     Head:     Comments: NGT tube, orally intubated. Neck:     Thyroid: No thyroid mass.     Trachea: Trachea normal. No tracheal deviation.  Cardiovascular:     Heart sounds: Normal heart sounds.  Pulmonary:     Effort: He is intubated.     Comments: No wheezing.  Vesicular breath sounds throughout.  Some bronchial breath sounds at bases. Currently on rest ventilator settings with Vt in the 200's Ppeak 20.  Abdominal:     General: Abdomen is protuberant.  No bowel sounds.     Palpations: Abdomen is soft.  Genitourinary:    Comments: Foley catheter in place clear urine. Neurological:     Mental Status: He is unresponsive to verbal commands.  Comments: Sedated    Ancillary tests:   ABG    Component Value Date/Time   PHART 7.413 06/19/2021 0317   PCO2ART 55.7 (H) 06/19/2021 0317   PO2ART 67 (L) 06/19/2021 0317   HCO3 35.6 (H) 06/19/2021 0317   TCO2 37 (H) 06/19/2021 0317   ACIDBASEDEF 3.2 (H) 12/14/2017 2135   O2SAT 93.0 06/19/2021 0317    Micro: moderate GNR but no WBC.  Hyperglycemic in [id number] Metabolic alkalosis Creatinine down to 1.85  HB down to 8.5   CXR reviewed and shows relatively clear lung fields.  Bibasilar consolidation.   Assessment & Plan:  Critically ill due to acute on chronic hypoxic and hypercapnic respiratory failure from influenza pneumonia on background of severe COPD.  Unable to  provide adequate gas exchange with lung protective ventilation and and spite of chemical paralysis. Critically ill due to rapid atrial fibrillation requiring titration of amiodarone Stage III COPD HIV disease well-controlled on ART Undiagnosed OSA. GERD History of diastolic heart failure Type 2 diabetes with uncontrolled hyperglycemia.   Doing well overall with acceptable gas exchange. Wean sedation today - response with help decide timing of tracheostomy  Plan:  - Continue VV-ECMO support and start to wean sweep slowly.  - Increase ventilator setting to prevent air hunger on awakening.  - Euvolemic at this time  - Transfuse 2 units to increase O2 carrying capacity.  - Transition to subcutaneous insulin.  - Continue enteral nutrition and sedation.  - Stop metoprolol and amiodarone. Start low dose digoxin.   Best Practice (right click and "Reselect all SmartList Selections" daily)   Diet/type: tubefeeds DVT prophylaxis: bivalirudin - increase rate today.  GI prophylaxis: PPI Lines: Central line and Arterial Line Foley:  Yes, and it is still needed Code Status:  full code Last date of multidisciplinary goals of care discussion [wife was updated at the bedside today. Explained that patient doing well. She asked about endpoints of support. I explained to her that they were 1. Recovery, 2. Failure of lungs to improve, 3. Development of other organ failures 4. ECMO circuit complications.   Labs   CBC: Recent Labs  Lab 07/01/2021 0412 06/11/2021 0420 06/15/2021 0257 06/20/2021 0408 06/16/2021 2356 06/29/2021 2358 06/18/21 0329 06/18/21 0334 06/18/21 1623 06/18/21 1632 06/18/21 2014 06/18/21 2020 06/19/21 0312 06/19/21 0317  WBC 13.7*   < > 7.9   < > 14.0*  --  13.9*  --  14.1*  --  13.4*  --  14.5*  --   NEUTROABS 9.8*  --  6.3  --   --   --   --   --   --   --   --   --   --   --   HGB 14.1   < > 12.4*   < > 9.7*   < > 9.6*   < > 8.7* 9.2* 8.2* 8.8* 8.0* 8.5*  HCT 45.7   < > 43.3    < > 32.8*   < > 32.5*   < > 29.6* 27.0* 28.2* 26.0* 26.7* 25.0*  MCV 93.8   < > 100.5*   < > 100.0  --  100.0  --  99.7  --  99.3  --  97.8  --   PLT 207   < > 157   < > 167  --  163  --  182  --  171  --  178  --    < > = values in this interval not displayed.  Basic Metabolic Panel: Recent Labs  Lab 06/16/21 0308 06/16/21 1100 06/16/21 2214 07/08/2021 0257 07/04/2021 0408 07/04/2021 1857 07/03/2021 1905 06/18/21 0329 06/18/21 0334 06/18/21 1623 06/18/21 1632 06/18/21 2020 06/19/21 0312 06/19/21 0317  NA 139   < >  --  143   < > 140   < > 140   < > 142 145 145 145 147*  K 5.5*   < >  --  5.1   < > 5.5*   < > 5.1   < > 5.6* 5.0 5.1 4.9 4.7  CL 99   < >  --  102  --  98  --  97*  --  100  --   --  105  --   CO2 35*   < >  --  33*  --  30  --  34*  --  34*  --   --  33*  --   GLUCOSE 236*   < >  --  149*  --  239*  --  345*  --  160*  --   --  215*  --   BUN 54*   < >  --  66*  --  86*  --  85*  --  103*  --   --  104*  --   CREATININE 1.68*   < >  --  1.64*  --  2.79*  --  2.37*  --  2.15*  --   --  1.85*  --   CALCIUM 9.0   < >  --  9.1  --  8.0*  --  7.8*  --  7.8*  --   --  8.2*  --   MG 2.9*  --  3.4*  --   --  2.9*  --  3.1*  --   --   --   --   --   --    < > = values in this interval not displayed.    GFR: Estimated Creatinine Clearance: 54.4 mL/min (A) (by C-G formula based on SCr of 1.85 mg/dL (H)). Recent Labs  Lab 07/04/2021 1857 06/30/2021 2155 06/29/2021 2356 06/18/21 0329 06/18/21 1623 06/18/21 2014 06/19/21 0312  WBC 15.0*  --    < > 13.9* 14.1* 13.4* 14.5*  LATICACIDVEN 2.7* 2.2*  --  1.6  --   --  0.8   < > = values in this interval not displayed.     Liver Function Tests: Recent Labs  Lab 06/14/21 0314 06/12/2021 1857 06/18/21 0329 06/19/21 0312  AST 23 62* 115* 112*  ALT 22 26 31  33  ALKPHOS 45 32* 33* 37*  BILITOT 0.6 2.6* 1.4* 1.2  PROT 7.7 5.7* 5.8* 5.9*  ALBUMIN 3.2* 2.2* 2.1* 1.9*    No results for input(s): LIPASE, AMYLASE in the last 168  hours. No results for input(s): AMMONIA in the last 168 hours.  ABG    Component Value Date/Time   PHART 7.413 06/19/2021 0317   PCO2ART 55.7 (H) 06/19/2021 0317   PO2ART 67 (L) 06/19/2021 0317   HCO3 35.6 (H) 06/19/2021 0317   TCO2 37 (H) 06/19/2021 0317   ACIDBASEDEF 3.2 (H) 12/14/2017 2135   O2SAT 93.0 06/19/2021 0317      Coagulation Profile: Recent Labs  Lab 07/07/2021 1857 06/18/21 0329 06/19/21 0312  INR 1.7* 2.1* 1.7*     Cardiac Enzymes: No results for input(s): CKTOTAL, CKMB, CKMBINDEX, TROPONINI in the last 168 hours.  HbA1C: HbA1c, POC (controlled diabetic  range)  Date/Time Value Ref Range Status  04/05/2021 02:09 PM 7.8 (A) 0.0 - 7.0 % Final  04/20/2020 11:13 AM 8.2 (A) 0.0 - 7.0 % Final   Hgb A1c MFr Bld  Date/Time Value Ref Range Status  07/04/2021 04:12 AM 6.8 (H) 4.8 - 5.6 % Final    Comment:    (NOTE) Pre diabetes:          5.7%-6.4%  Diabetes:              >6.4%  Glycemic control for   <7.0% adults with diabetes   10/30/2020 02:25 AM 7.3 (H) 4.8 - 5.6 % Final    Comment:    (NOTE) Pre diabetes:          5.7%-6.4%  Diabetes:              >6.4%  Glycemic control for   <7.0% adults with diabetes     CBG: Recent Labs  Lab 06/19/21 0216 06/19/21 0314 06/19/21 0419 06/19/21 0517 06/19/21 0622  GLUCAP 209* 199* 217* 191* 167*    CRITICAL CARE Performed by: Kipp Brood   Total critical care time: 40 minutes  Critical care time was exclusive of separately billable procedures and treating other patients.  Critical care was necessary to treat or prevent imminent or life-threatening deterioration.  Critical care was time spent personally by me on the following activities: development of treatment plan with patient and/or surrogate as well as nursing, discussions with consultants, evaluation of patient's response to treatment, examination of patient, obtaining history from patient or surrogate, ordering and performing treatments  and interventions, ordering and review of laboratory studies, ordering and review of radiographic studies, pulse oximetry, re-evaluation of patient's condition and participation in multidisciplinary rounds.  Kipp Brood, MD [facility] ICU Physician [place]  Pager: [phone] Mobile: [phone] After hours: [phone].

## 2021-06-19 NOTE — Progress Notes (Signed)
OT Cancellation Note  Patient Details Name: Chris [name]. MRN: [id number] DOB: [birth year]   Cancelled Treatment:    Reason Eval/Treat Not Completed: Patient not medically ready. Pt remains intubated and sedated.   [name] 06/19/2021, 8:19 AM [name], OTR/L Acute Rehabilitation Services Pager: [phone] Office: [phone]

## 2021-06-20 ENCOUNTER — Inpatient Hospital Stay (HOSPITAL_COMMUNITY): Payer: Medicare HMO

## 2021-06-20 DIAGNOSIS — J101 Influenza due to other identified influenza virus with other respiratory manifestations: Secondary | ICD-10-CM | POA: Diagnosis not present

## 2021-06-20 DIAGNOSIS — J9602 Acute respiratory failure with hypercapnia: Secondary | ICD-10-CM | POA: Diagnosis not present

## 2021-06-20 DIAGNOSIS — J9601 Acute respiratory failure with hypoxia: Secondary | ICD-10-CM | POA: Diagnosis not present

## 2021-06-20 LAB — CBC
HCT: 29.3 % — ABNORMAL LOW (ref 39.0–52.0)
HCT: 29 % — ABNORMAL LOW (ref 39.0–52.0)
Hemoglobin: 8.8 g/dL — ABNORMAL LOW (ref 13.0–17.0)
Hemoglobin: 8.4 g/dL — ABNORMAL LOW (ref 13.0–17.0)
MCH: 29.3 pg (ref 26.0–34.0)
MCH: 28.8 pg (ref 26.0–34.0)
MCHC: 30 g/dL (ref 30.0–36.0)
MCHC: 29 g/dL — ABNORMAL LOW (ref 30.0–36.0)
MCV: 97.7 fL (ref 80.0–100.0)
MCV: 99.3 fL (ref 80.0–100.0)
Platelets: 153 10*3/uL (ref 150–400)
Platelets: 138 10*3/uL — ABNORMAL LOW (ref 150–400)
RBC: 3 MIL/uL — ABNORMAL LOW (ref 4.22–5.81)
RBC: 2.92 MIL/uL — ABNORMAL LOW (ref 4.22–5.81)
RDW: 15.9 % — ABNORMAL HIGH (ref 11.5–15.5)
RDW: 15.8 % — ABNORMAL HIGH (ref 11.5–15.5)
WBC: 10.2 10*3/uL (ref 4.0–10.5)
WBC: 9.6 10*3/uL (ref 4.0–10.5)
nRBC: 0.7 % — ABNORMAL HIGH (ref 0.0–0.2)
nRBC: 0.4 % — ABNORMAL HIGH (ref 0.0–0.2)

## 2021-06-20 LAB — POCT I-STAT 7, (LYTES, BLD GAS, ICA,H+H)
Acid-Base Excess: 9 mmol/L — ABNORMAL HIGH (ref 0.0–2.0)
Acid-Base Excess: 7 mmol/L — ABNORMAL HIGH (ref 0.0–2.0)
Acid-Base Excess: 8 mmol/L — ABNORMAL HIGH (ref 0.0–2.0)
Acid-Base Excess: 5 mmol/L — ABNORMAL HIGH (ref 0.0–2.0)
Acid-Base Excess: 6 mmol/L — ABNORMAL HIGH (ref 0.0–2.0)
Acid-Base Excess: 4 mmol/L — ABNORMAL HIGH (ref 0.0–2.0)
Acid-Base Excess: 4 mmol/L — ABNORMAL HIGH (ref 0.0–2.0)
Bicarbonate: 35.2 mmol/L — ABNORMAL HIGH (ref 20.0–28.0)
Bicarbonate: 34 mmol/L — ABNORMAL HIGH (ref 20.0–28.0)
Bicarbonate: 34.6 mmol/L — ABNORMAL HIGH (ref 20.0–28.0)
Bicarbonate: 31.5 mmol/L — ABNORMAL HIGH (ref 20.0–28.0)
Bicarbonate: 32.3 mmol/L — ABNORMAL HIGH (ref 20.0–28.0)
Bicarbonate: 30.6 mmol/L — ABNORMAL HIGH (ref 20.0–28.0)
Bicarbonate: 30.3 mmol/L — ABNORMAL HIGH (ref 20.0–28.0)
Calcium, Ion: 1.26 mmol/L (ref 1.15–1.40)
Calcium, Ion: 1.3 mmol/L (ref 1.15–1.40)
Calcium, Ion: 1.32 mmol/L (ref 1.15–1.40)
Calcium, Ion: 1.3 mmol/L (ref 1.15–1.40)
Calcium, Ion: 1.32 mmol/L (ref 1.15–1.40)
Calcium, Ion: 1.32 mmol/L (ref 1.15–1.40)
Calcium, Ion: 1.3 mmol/L (ref 1.15–1.40)
HCT: 25 % — ABNORMAL LOW (ref 39.0–52.0)
HCT: 24 % — ABNORMAL LOW (ref 39.0–52.0)
HCT: 24 % — ABNORMAL LOW (ref 39.0–52.0)
HCT: 24 % — ABNORMAL LOW (ref 39.0–52.0)
HCT: 24 % — ABNORMAL LOW (ref 39.0–52.0)
HCT: 24 % — ABNORMAL LOW (ref 39.0–52.0)
HCT: 24 % — ABNORMAL LOW (ref 39.0–52.0)
Hemoglobin: 8.5 g/dL — ABNORMAL LOW (ref 13.0–17.0)
Hemoglobin: 8.2 g/dL — ABNORMAL LOW (ref 13.0–17.0)
Hemoglobin: 8.2 g/dL — ABNORMAL LOW (ref 13.0–17.0)
Hemoglobin: 8.2 g/dL — ABNORMAL LOW (ref 13.0–17.0)
Hemoglobin: 8.2 g/dL — ABNORMAL LOW (ref 13.0–17.0)
Hemoglobin: 8.2 g/dL — ABNORMAL LOW (ref 13.0–17.0)
Hemoglobin: 8.2 g/dL — ABNORMAL LOW (ref 13.0–17.0)
O2 Saturation: 95 %
O2 Saturation: 96 %
O2 Saturation: 95 %
O2 Saturation: 89 %
O2 Saturation: 89 %
O2 Saturation: 92 %
O2 Saturation: 91 %
Patient temperature: 98.8
Patient temperature: 36.6
Patient temperature: 36.6
Patient temperature: 36.7
Patient temperature: 36.8
Patient temperature: 36.7
Patient temperature: 98.9
Potassium: 5.6 mmol/L — ABNORMAL HIGH (ref 3.5–5.1)
Potassium: 5.2 mmol/L — ABNORMAL HIGH (ref 3.5–5.1)
Potassium: 5.2 mmol/L — ABNORMAL HIGH (ref 3.5–5.1)
Potassium: 5.6 mmol/L — ABNORMAL HIGH (ref 3.5–5.1)
Potassium: 5.4 mmol/L — ABNORMAL HIGH (ref 3.5–5.1)
Potassium: 5.4 mmol/L — ABNORMAL HIGH (ref 3.5–5.1)
Potassium: 5.4 mmol/L — ABNORMAL HIGH (ref 3.5–5.1)
Sodium: 154 mmol/L — ABNORMAL HIGH (ref 135–145)
Sodium: 155 mmol/L — ABNORMAL HIGH (ref 135–145)
Sodium: 156 mmol/L — ABNORMAL HIGH (ref 135–145)
Sodium: 154 mmol/L — ABNORMAL HIGH (ref 135–145)
Sodium: 153 mmol/L — ABNORMAL HIGH (ref 135–145)
Sodium: 154 mmol/L — ABNORMAL HIGH (ref 135–145)
Sodium: 155 mmol/L — ABNORMAL HIGH (ref 135–145)
TCO2: 37 mmol/L — ABNORMAL HIGH (ref 22–32)
TCO2: 36 mmol/L — ABNORMAL HIGH (ref 22–32)
TCO2: 37 mmol/L — ABNORMAL HIGH (ref 22–32)
TCO2: 33 mmol/L — ABNORMAL HIGH (ref 22–32)
TCO2: 34 mmol/L — ABNORMAL HIGH (ref 22–32)
TCO2: 32 mmol/L (ref 22–32)
TCO2: 32 mmol/L (ref 22–32)
pCO2 arterial: 59.2 mmHg — ABNORMAL HIGH (ref 32.0–48.0)
pCO2 arterial: 60.4 mmHg — ABNORMAL HIGH (ref 32.0–48.0)
pCO2 arterial: 61.9 mmHg — ABNORMAL HIGH (ref 32.0–48.0)
pCO2 arterial: 55.2 mmHg — ABNORMAL HIGH (ref 32.0–48.0)
pCO2 arterial: 60.2 mmHg — ABNORMAL HIGH (ref 32.0–48.0)
pCO2 arterial: 53.7 mmHg — ABNORMAL HIGH (ref 32.0–48.0)
pCO2 arterial: 51.9 mmHg — ABNORMAL HIGH (ref 32.0–48.0)
pH, Arterial: 7.383 (ref 7.350–7.450)
pH, Arterial: 7.357 (ref 7.350–7.450)
pH, Arterial: 7.354 (ref 7.350–7.450)
pH, Arterial: 7.363 (ref 7.350–7.450)
pH, Arterial: 7.337 — ABNORMAL LOW (ref 7.350–7.450)
pH, Arterial: 7.363 (ref 7.350–7.450)
pH, Arterial: 7.374 (ref 7.350–7.450)
pO2, Arterial: 78 mmHg — ABNORMAL LOW (ref 83.0–108.0)
pO2, Arterial: 86 mmHg (ref 83.0–108.0)
pO2, Arterial: 82 mmHg — ABNORMAL LOW (ref 83.0–108.0)
pO2, Arterial: 59 mmHg — ABNORMAL LOW (ref 83.0–108.0)
pO2, Arterial: 62 mmHg — ABNORMAL LOW (ref 83.0–108.0)
pO2, Arterial: 68 mmHg — ABNORMAL LOW (ref 83.0–108.0)
pO2, Arterial: 64 mmHg — ABNORMAL LOW (ref 83.0–108.0)

## 2021-06-20 LAB — HEPATIC FUNCTION PANEL
ALT: 36 U/L (ref 0–44)
AST: 79 U/L — ABNORMAL HIGH (ref 15–41)
Albumin: 1.9 g/dL — ABNORMAL LOW (ref 3.5–5.0)
Alkaline Phosphatase: 42 U/L (ref 38–126)
Bilirubin, Direct: 0.2 mg/dL (ref 0.0–0.2)
Indirect Bilirubin: 0.8 mg/dL (ref 0.3–0.9)
Total Bilirubin: 1 mg/dL (ref 0.3–1.2)
Total Protein: 6 g/dL — ABNORMAL LOW (ref 6.5–8.1)

## 2021-06-20 LAB — BPAM RBC
Blood Product Expiration Date: 202211202359
Blood Product Expiration Date: 202211212359
Blood Product Expiration Date: 202211212359
Blood Product Expiration Date: 202211212359
Blood Product Expiration Date: 202211212359
Blood Product Expiration Date: 202211212359
Blood Product Expiration Date: 202211212359
Blood Product Expiration Date: 202211212359
Blood Product Expiration Date: 202211212359
Blood Product Expiration Date: 202211242359
ISSUE DATE / TIME: 202211071555
ISSUE DATE / TIME: 202211071555
ISSUE DATE / TIME: 202211080402
ISSUE DATE / TIME: 202211090211
ISSUE DATE / TIME: 202211090832
ISSUE DATE / TIME: 202211090836
Unit Type and Rh: 6200
Unit Type and Rh: 6200
Unit Type and Rh: 6200
Unit Type and Rh: 6200
Unit Type and Rh: 6200
Unit Type and Rh: 6200
Unit Type and Rh: 6200
Unit Type and Rh: 6200
Unit Type and Rh: 6200
Unit Type and Rh: 6200

## 2021-06-20 LAB — GLUCOSE, CAPILLARY
Glucose-Capillary: 192 mg/dL — ABNORMAL HIGH (ref 70–99)
Glucose-Capillary: 168 mg/dL — ABNORMAL HIGH (ref 70–99)
Glucose-Capillary: 153 mg/dL — ABNORMAL HIGH (ref 70–99)
Glucose-Capillary: 154 mg/dL — ABNORMAL HIGH (ref 70–99)
Glucose-Capillary: 129 mg/dL — ABNORMAL HIGH (ref 70–99)
Glucose-Capillary: 116 mg/dL — ABNORMAL HIGH (ref 70–99)
Glucose-Capillary: 111 mg/dL — ABNORMAL HIGH (ref 70–99)
Glucose-Capillary: 117 mg/dL — ABNORMAL HIGH (ref 70–99)
Glucose-Capillary: 119 mg/dL — ABNORMAL HIGH (ref 70–99)
Glucose-Capillary: 127 mg/dL — ABNORMAL HIGH (ref 70–99)
Glucose-Capillary: 131 mg/dL — ABNORMAL HIGH (ref 70–99)
Glucose-Capillary: 145 mg/dL — ABNORMAL HIGH (ref 70–99)
Glucose-Capillary: 150 mg/dL — ABNORMAL HIGH (ref 70–99)
Glucose-Capillary: 200 mg/dL — ABNORMAL HIGH (ref 70–99)
Glucose-Capillary: 236 mg/dL — ABNORMAL HIGH (ref 70–99)
Glucose-Capillary: 250 mg/dL — ABNORMAL HIGH (ref 70–99)
Glucose-Capillary: 268 mg/dL — ABNORMAL HIGH (ref 70–99)
Glucose-Capillary: 242 mg/dL — ABNORMAL HIGH (ref 70–99)
Glucose-Capillary: 226 mg/dL — ABNORMAL HIGH (ref 70–99)
Glucose-Capillary: 201 mg/dL — ABNORMAL HIGH (ref 70–99)
Glucose-Capillary: 194 mg/dL — ABNORMAL HIGH (ref 70–99)

## 2021-06-20 LAB — TYPE AND SCREEN
ABO/RH(D): A POS
Antibody Screen: NEGATIVE
Unit division: 0
Unit division: 0
Unit division: 0
Unit division: 0
Unit division: 0
Unit division: 0
Unit division: 0
Unit division: 0
Unit division: 0
Unit division: 0

## 2021-06-20 LAB — BASIC METABOLIC PANEL
Anion gap: 5 (ref 5–15)
Anion gap: 5 (ref 5–15)
BUN: 98 mg/dL — ABNORMAL HIGH (ref 6–20)
BUN: 83 mg/dL — ABNORMAL HIGH (ref 6–20)
CO2: 30 mmol/L (ref 22–32)
CO2: 30 mmol/L (ref 22–32)
Calcium: 8.6 mg/dL — ABNORMAL LOW (ref 8.9–10.3)
Calcium: 8.6 mg/dL — ABNORMAL LOW (ref 8.9–10.3)
Chloride: 114 mmol/L — ABNORMAL HIGH (ref 98–111)
Chloride: 117 mmol/L — ABNORMAL HIGH (ref 98–111)
Creatinine, Ser: 1.63 mg/dL — ABNORMAL HIGH (ref 0.61–1.24)
Creatinine, Ser: 1.31 mg/dL — ABNORMAL HIGH (ref 0.61–1.24)
GFR, Estimated: 49 mL/min — ABNORMAL LOW (ref >60)
GFR, Estimated: >60 mL/min (ref >60)
Glucose, Bld: 155 mg/dL — ABNORMAL HIGH (ref 70–99)
Glucose, Bld: 253 mg/dL — ABNORMAL HIGH (ref 70–99)
Potassium: 5.4 mmol/L — ABNORMAL HIGH (ref 3.5–5.1)
Potassium: 5.6 mmol/L — ABNORMAL HIGH (ref 3.5–5.1)
Sodium: 149 mmol/L — ABNORMAL HIGH (ref 135–145)
Sodium: 152 mmol/L — ABNORMAL HIGH (ref 135–145)

## 2021-06-20 LAB — LACTATE DEHYDROGENASE: LDH: 310 U/L — ABNORMAL HIGH (ref 98–192)

## 2021-06-20 LAB — FIBRINOGEN: Fibrinogen: >800 mg/dL — ABNORMAL HIGH (ref 210–475)

## 2021-06-20 LAB — APTT
aPTT: 40 seconds — ABNORMAL HIGH (ref 24–36)
aPTT: 51 seconds — ABNORMAL HIGH (ref 24–36)
aPTT: 60 seconds — ABNORMAL HIGH (ref 24–36)

## 2021-06-20 LAB — LACTIC ACID, PLASMA
Lactic Acid, Venous: 0.8 mmol/L (ref 0.5–1.9)
Lactic Acid, Venous: 0.8 mmol/L (ref 0.5–1.9)

## 2021-06-20 LAB — PROTIME-INR
INR: 1.7 — ABNORMAL HIGH (ref 0.8–1.2)
Prothrombin Time: 20.3 seconds — ABNORMAL HIGH (ref 11.4–15.2)

## 2021-06-20 MED ORDER — DIGOXIN 125 MCG PO TABS: 0.0625 mg | ORAL_TABLET | Freq: Every day | ORAL | Status: DC

## 2021-06-20 MED ORDER — IPRATROPIUM-ALBUTEROL 0.5-2.5 (3) MG/3ML IN SOLN
3.0000 mL | Freq: Four times a day (QID) | RESPIRATORY_TRACT | Status: DC
  Administered 2021-06-21 – 2021-06-23 (×9): 3 mL via RESPIRATORY_TRACT
  Filled 2021-06-20 (×9): qty 3

## 2021-06-20 MED ORDER — MIDAZOLAM HCL 2 MG/2ML IJ SOLN
4.0000 mg | Freq: Once | INTRAMUSCULAR | Status: AC
  Administered 2021-06-20: 4 mg via INTRAVENOUS

## 2021-06-20 MED ORDER — CHLORHEXIDINE GLUCONATE 0.12 % MT SOLN
OROMUCOSAL | Status: AC
  Administered 2021-06-20: 15 mL
  Filled 2021-06-20: qty 15

## 2021-06-20 MED ORDER — SODIUM ZIRCONIUM CYCLOSILICATE 5 G PO PACK
5.0000 g | PACK | Freq: Once | ORAL | Status: AC
Start: 2021-06-20 — End: 2021-06-20
  Administered 2021-06-20: 5 g
  Filled 2021-06-20: qty 1

## 2021-06-20 MED ORDER — INSULIN DETEMIR 100 UNIT/ML ~~LOC~~ SOLN
30.0000 [IU] | Freq: Two times a day (BID) | SUBCUTANEOUS | Status: DC
  Administered 2021-06-20 – 2021-06-24 (×9): 30 [IU] via SUBCUTANEOUS
  Filled 2021-06-20 (×11): qty 0.3

## 2021-06-20 MED ORDER — MIDAZOLAM HCL 2 MG/2ML IJ SOLN
INTRAMUSCULAR | Status: AC
  Filled 2021-06-20: qty 4

## 2021-06-20 MED ORDER — SODIUM CHLORIDE 0.9 % IV SOLN
0.2200 mg/kg/h | INTRAVENOUS | Status: DC
  Administered 2021-06-20 – 2021-06-24 (×9): 0.28 mg/kg/h via INTRAVENOUS
  Administered 2021-06-25 – 2021-06-27 (×5): 0.24 mg/kg/h via INTRAVENOUS
  Filled 2021-06-20 (×16): qty 250

## 2021-06-20 MED ORDER — METOPROLOL TARTRATE 25 MG/10 ML ORAL SUSPENSION
12.5000 mg | Freq: Three times a day (TID) | ORAL | Status: DC
Start: 2021-06-20 — End: 2021-06-22
  Administered 2021-06-20 – 2021-06-21 (×6): 12.5 mg
  Filled 2021-06-20 (×6): qty 5

## 2021-06-20 MED ORDER — FREE WATER
200.0000 mL | Freq: Three times a day (TID) | Status: DC
Start: 2021-06-20 — End: 2021-06-21
  Administered 2021-06-20 – 2021-06-21 (×3): 200 mL

## 2021-06-20 MED ORDER — ETOMIDATE 2 MG/ML IV SOLN
INTRAVENOUS | Status: AC
  Administered 2021-06-20: 10 mg
  Filled 2021-06-20: qty 20

## 2021-06-20 NOTE — Procedures (Signed)
Diagnostic Bronchoscopy  Chris [name]  [id number]  02-Jan-1966  Date:06/20/21  Time:3:11 PM   Provider Performing:Chera Slivka   Procedure: Diagnostic Bronchoscopy (57322)  Indication(s) Assist with direct visualization of tracheostomy placement  Consent Risks of the procedure as well as the alternatives and risks of each were explained to the patient and/or caregiver.  Consent for the procedure was obtained.   Anesthesia See separate tracheostomy note   Time Out Verified patient identification, verified procedure, site/side was marked, verified correct patient position, special equipment/implants available, medications/allergies/relevant history reviewed, required imaging and test results available.   Sterile Technique Usual hand hygiene, masks, gowns, and gloves were used   Procedure Description Bronchoscope advanced through endotracheal tube and into airway.  After suctioning out tracheal secretions, bronchoscope used to provide direct visualization of tracheostomy placement.   Complications/Tolerance None; patient tolerated the procedure well.   EBL None  Specimen(s) None

## 2021-06-20 NOTE — Progress Notes (Signed)
ANTICOAGULATION CONSULT NOTE   Pharmacy Consult for Bivalirudin Indication:  ECMO and atrial fibrillation  Allergies  Allergen Reactions   Bactrim [Sulfamethoxazole-Trimethoprim] Hives    Patient Measurements: Height: 5\' 9"  (175.3 cm) Weight: 110.6 kg (243 lb 13.3 oz) (new bed) IBW/kg (Calculated) : 70.7  Vital Signs: Temp: 98.9 F (37.2 C) (11/10 2000) Temp Source: Oral (11/10 2000) BP: 119/65 (11/10 1958) Pulse Rate: 109 (11/10 1958)  Labs: Recent Labs    06/18/21 0329 06/18/21 0334 06/19/21 0312 06/19/21 0317 06/19/21 1541 06/19/21 1551 06/20/21 0108 06/20/21 0303 06/20/21 0310 06/20/21 0800 06/20/21 0811 06/20/21 1700 06/20/21 1725 06/20/21 1824 06/20/21 1958 06/20/21 2002  HGB 9.6*   < > 8.0*   < > 8.8*   < >  --  8.8*   < >  --    < > 8.4* 8.2* 8.2* 8.2*  --   HCT 32.5*   < > 26.7*   < > 28.8*   < >  --  29.3*   < >  --    < > 29.0* 24.0* 24.0* 24.0*  --   PLT 163   < > 178  --  159  --   --  153  --   --   --  138*  --   --   --   --   APTT 49*   < > 52*   < >  --    < > 40*  --   --  51*  --   --   --   --   --  60*  LABPROT 23.1*  --  19.8*  --   --   --   --  20.3*  --   --   --   --   --   --   --   --   INR 2.1*  --  1.7*  --   --   --   --  1.7*  --   --   --   --   --   --   --   --   CREATININE 2.37*   < > 1.85*  --  1.69*  --   --  1.63*  --   --   --  1.31*  --   --   --   --    < > = values in this interval not displayed.     Estimated Creatinine Clearance: 78.1 mL/min (A) (by C-G formula based on SCr of 1.31 mg/dL (H)).   Medical History: Past Medical History:  Diagnosis Date   Anxiety    Atrial fibrillation (HCC)    CHF (congestive heart failure) (HCC)    COPD (chronic obstructive pulmonary disease) (HCC)    Emphysema [[name]]   Depression    Diabetes mellitus without complication (HCC)    type 2   Emphysema of lung (HCC)    GERD (gastroesophageal reflux disease)    HIV disease (HCC)    Hypertension    Oxygen deficiency      Assessment: 55 Y/O male on chronic apixaban for atrial fibrillation. Previous placed on heparin in anticipation of line placement and possible procedures.  Last apixaban dose given @0948  11/7. Patient cannulated and ECMO initiated 11/7; received a heparin bolus at cannulation, then switched to bivalrudin infusion. Pharmacy to dose bivalirudin.  aPTT 60sec at goal after increase to bivalrudin 0.28mg /kg/hr. CBC stable - hgb low 8's, plt normal, LDH stable 300s. S/p trach placed 11/10  Bivalirudin  off for that procedure then resumed   Goal of Therapy:  aPTT 60-80 secs Monitor platelets by anticoagulation protocol: Yes   Plan:  Continue bivalirudin to 0.28 mg/kg/hr  Aptt and CBC q12hr    Leota Sauers Pharm.D. CPP, BCPS Clinical Pharmacist [phone] 06/20/2021 10:01 PM   Please check [email or web address] for unit-specific pharmacy phone numbers.

## 2021-06-20 NOTE — Progress Notes (Addendum)
NAME:  Chris Closs., MRN:  [id number], DOB:  March 29, 1966, LOS: 7 ADMISSION DATE:  06/19/2021, CONSULTATION DATE:  06/15/2019 REFERRING MD:  Ruthann Cancer, CHIEF COMPLAINT:  hypercapnic respiratory failure   History of Present Illness:   Chris Pollan. is an 55 y.o. male.  HPI: 55 year old man with a significant history of pre-existing Gold stage III severe COPD for over 10 years on intermittent home oxygen.  More dependent on oxygen recently but apparently good quality of life nonetheless.  Patient is as active as a breathing allows.  HIV disease with undetectable viral load, compliant with HART.  Last CD4 count 350.   Previously admitted 2019 with ARDS secondary to severe influenza.  Required tracheostomy at that time but was successfully decannulated and returned to baseline level of function.   Presented 11/3 with increasing shortness of breath and fevers.  Was initially started on BiPAP and found to be influenza A positive.  Eventually required intubation 11/3.   Consulted for ECMO 11/4 for refractory hypoxia and hypercapnia.  Pertinent  Medical History   Past Medical History:  Diagnosis Date   Anxiety    Atrial fibrillation (HCC)    CHF (congestive heart failure) (HCC)    COPD (chronic obstructive pulmonary disease) ([place])    Emphysema [[name]]   Depression    Diabetes mellitus without complication ([place])    type 2   Emphysema of lung ([place])    GERD (gastroesophageal reflux disease)    HIV disease ([place])    Hypertension    Oxygen deficiency      Significant Hospital Events: Including procedures, antibiotic start and stop dates in addition to other pertinent events   11/7- VV ECMO started 11/9 - weaning sedation. Transfused 2 units PRBC to keep O2 carrying capacity.  11/9 - remains agitated with wake up   Interim History / Subjective:  Weaned off pressors. Awakens briskly with stimulation.   Objective   Blood pressure (!) 116/53, pulse 87, temperature (!) 97.3 F (36.3  C), temperature source Core, resp. rate 11, height 5\' 9"  (1.753 m), weight 110.6 kg, SpO2 96 %.    Vent Mode: PCV FiO2 (%):  [40 %] 40 % Set Rate:  [18 bmp] 18 bmp PEEP:  [10 cmH20] 10 cmH20 Plateau Pressure:  [19 cmH20-24 cmH20] 19 cmH20   Intake/Output Summary (Last 24 hours) at 06/20/2021 0905 Last data filed at 06/20/2021 0900 Gross per 24 hour  Intake 4785.6 ml  Output 2990 ml  Net 1795.6 ml    Filed Weights   06/18/21 0500 06/19/21 0500 06/20/21 0500  Weight: 108.8 kg 106.9 kg 110.6 kg    Examination: Constitutional:      Appearance: He is obese.     Interventions: He is sedated and intubated.  HENT:     Head:     Comments: NGT tube, orally intubated. + cuff leak.  Neck:     Thyroid: No thyroid mass.     Trachea: Trachea normal. No tracheal deviation.  Cardiovascular:     Heart sounds: Normal heart sounds.  Pulmonary:     Effort: He is intubated.     Comments: No wheezing.  Vesicular breath sounds throughout.  Some bronchial breath sounds at bases. Currently on rest ventilator settings with Vt in the 200's Ppeak 20.  Abdominal:     General: Abdomen is protuberant.  No bowel sounds.     Palpations: Abdomen is soft.  Genitourinary:    Comments: Foley catheter in place clear urine. Neurological:  Mental Status: He is unresponsive to verbal commands.      Comments: Sedated    Ancillary tests:   ABG    Component Value Date/Time   PHART 7.383 06/20/2021 0310   PCO2ART 59.2 (H) 06/20/2021 0310   PO2ART 78 (L) 06/20/2021 0310   HCO3 35.2 (H) 06/20/2021 0310   TCO2 37 (H) 06/20/2021 0310   ACIDBASEDEF 3.2 (H) 12/14/2017 2135   O2SAT 95.0 06/20/2021 0310    Micro: moderate GNR but no WBC.  Metabolic alkalosis improving.  Creatinine down to 1.65 Still hyperkalemic at 5.6 HB down to 8.5   CXR reviewed and shows relatively clear lung fields.  Bibasilar consolidation.   Assessment & Plan:  Critically ill due to acute on chronic hypoxic and hypercapnic  respiratory failure from influenza pneumonia on background of severe COPD.  Unable to provide adequate gas exchange with lung protective ventilation and and spite of chemical paralysis. Critically ill due to rapid atrial fibrillation requiring titration of amiodarone Stage III COPD HIV disease well-controlled on ART Undiagnosed OSA. GERD History of diastolic heart failure Type 2 diabetes with uncontrolled hyperglycemia.  Hypernatremia from insensitive losses.   Doing well overall with acceptable gas exchange. Wean sedation today - response with help decide timing of tracheostomy  Plan:  - Tracheostomy today to facilitate weaning and reduce sedative requirements  - Transfuse 2 units to increase O2 carrying capacity.  - continue IV insulin until steroids are off.  - Continue enteral nutrition and sedation.  - Resume low dose metoprolol for Afib control.  - Lokelma for hyperkalemia.   Best Practice (right click and "Reselect all SmartList Selections" daily)   Diet/type: tubefeeds on hold for trach. DVT prophylaxis: bivalirudin - on hold for tracheostomy GI prophylaxis: PPI Lines: Central line and Arterial Line Foley:  Yes, and it is still needed Code Status:  full code Last date of multidisciplinary goals of care discussion [wife was updated at the bedside today. Explained that patient doing well. She asked about endpoints of support. I explained to her that they were 1. Recovery, 2. Failure of lungs to improve, 3. Development of other organ failures 4. ECMO circuit complications.   Labs   CBC: Recent Labs  Lab 06/27/2021 0257 07/02/2021 0408 06/18/21 1623 06/18/21 1632 06/18/21 2014 06/18/21 2020 06/19/21 0312 06/19/21 0317 06/19/21 1541 06/19/21 1551 06/19/21 1958 06/20/21 0303 06/20/21 0310  WBC 7.9   < > 14.1*  --  13.4*  --  14.5*  --  13.5*  --   --  10.2  --   NEUTROABS 6.3  --   --   --   --   --   --   --   --   --   --   --   --   HGB 12.4*   < > 8.7*   < > 8.2*    < > 8.0*   < > 8.8* 8.8* 8.8* 8.8* 8.5*  HCT 43.3   < > 29.6*   < > 28.2*   < > 26.7*   < > 28.8* 26.0* 26.0* 29.3* 25.0*  MCV 100.5*   < > 99.7  --  99.3  --  97.8  --  95.7  --   --  97.7  --   PLT 157   < > 182  --  171  --  178  --  159  --   --  153  --    < > = values in this interval  not displayed.     Basic Metabolic Panel: Recent Labs  Lab 06/16/21 0308 06/16/21 1100 06/16/21 2214 2021-07-16 0257 07/16/21 1857 07/16/21 1905 06/18/21 0329 06/18/21 0334 06/18/21 1623 06/18/21 1632 06/19/21 0312 06/19/21 0317 06/19/21 1541 06/19/21 1551 06/19/21 1958 06/20/21 0303 06/20/21 0310  NA 139   < >  --    < > 140   < > 140   < > 142   < > 145   < > 146* 148* 150* 149* 154*  K 5.5*   < >  --    < > 5.5*   < > 5.1   < > 5.6*   < > 4.9   < > 5.6* 5.5* 5.2* 5.4* 5.6*  CL 99   < >  --    < > 98  --  97*  --  100  --  105  --  109  --   --  114*  --   CO2 35*   < >  --    < > 30  --  34*  --  34*  --  33*  --  31  --   --  30  --   GLUCOSE 236*   < >  --    < > 239*  --  345*  --  160*  --  215*  --  381*  --   --  155*  --   BUN 54*   < >  --    < > 86*  --  85*  --  103*  --  104*  --  97*  --   --  98*  --   CREATININE 1.68*   < >  --    < > 2.79*  --  2.37*  --  2.15*  --  1.85*  --  1.69*  --   --  1.63*  --   CALCIUM 9.0   < >  --    < > 8.0*  --  7.8*  --  7.8*  --  8.2*  --  8.3*  --   --  8.6*  --   MG 2.9*  --  3.4*  --  2.9*  --  3.1*  --   --   --   --   --   --   --   --   --   --    < > = values in this interval not displayed.    GFR: Estimated Creatinine Clearance: 62.8 mL/min (A) (by C-G formula based on SCr of 1.63 mg/dL (H)). Recent Labs  Lab 07-16-2021 2155 07/16/21 2356 06/18/21 0329 06/18/21 1623 06/18/21 2014 06/19/21 0312 06/19/21 1541 06/20/21 0303 06/20/21 0509  WBC  --    < > 13.9*   < > 13.4* 14.5* 13.5* 10.2  --   LATICACIDVEN 2.2*  --  1.6  --   --  0.8  --   --  0.8   < > = values in this interval not displayed.     Liver Function  Tests: Recent Labs  Lab 06/14/21 0314 07-16-21 1857 06/18/21 0329 06/19/21 0312 06/20/21 0303  AST 23 62* 115* 112* 79*  ALT 22 26 31  33 36  ALKPHOS 45 32* 33* 37* 42  BILITOT 0.6 2.6* 1.4* 1.2 1.0  PROT 7.7 5.7* 5.8* 5.9* 6.0*  ALBUMIN 3.2* 2.2* 2.1* 1.9* 1.9*    No results for input(s): LIPASE, AMYLASE in the last 168 hours.  No results for input(s): AMMONIA in the last 168 hours.  ABG    Component Value Date/Time   PHART 7.383 06/20/2021 0310   PCO2ART 59.2 (H) 06/20/2021 0310   PO2ART 78 (L) 06/20/2021 0310   HCO3 35.2 (H) 06/20/2021 0310   TCO2 37 (H) 06/20/2021 0310   ACIDBASEDEF 3.2 (H) 12/14/2017 2135   O2SAT 95.0 06/20/2021 0310      Coagulation Profile: Recent Labs  Lab 2021/07/04 1857 06/18/21 0329 06/19/21 0312 06/20/21 0303  INR 1.7* 2.1* 1.7* 1.7*     Cardiac Enzymes: No results for input(s): CKTOTAL, CKMB, CKMBINDEX, TROPONINI in the last 168 hours.  HbA1C: HbA1c, POC (controlled diabetic range)  Date/Time Value Ref Range Status  04/05/2021 02:09 PM 7.8 (A) 0.0 - 7.0 % Final  04/20/2020 11:13 AM 8.2 (A) 0.0 - 7.0 % Final   Hgb A1c MFr Bld  Date/Time Value Ref Range Status  07/08/2021 04:12 AM 6.8 (H) 4.8 - 5.6 % Final    Comment:    (NOTE) Pre diabetes:          5.7%-6.4%  Diabetes:              >6.4%  Glycemic control for   <7.0% adults with diabetes   10/30/2020 02:25 AM 7.3 (H) 4.8 - 5.6 % Final    Comment:    (NOTE) Pre diabetes:          5.7%-6.4%  Diabetes:              >6.4%  Glycemic control for   <7.0% adults with diabetes     CBG: Recent Labs  Lab 06/20/21 0153 06/20/21 0307 06/20/21 0508 06/20/21 0621 06/20/21 0810  GLUCAP 153* 154* 129* 116* 111*    CRITICAL CARE Performed by: [name]   Total critical care time: 40 minutes  Critical care time was exclusive of separately billable procedures and treating other patients.  Critical care was necessary to treat or prevent imminent or  life-threatening deterioration.  Critical care was time spent personally by me on the following activities: development of treatment plan with patient and/or surrogate as well as nursing, discussions with consultants, evaluation of patient's response to treatment, examination of patient, obtaining history from patient or surrogate, ordering and performing treatments and interventions, ordering and review of laboratory studies, ordering and review of radiographic studies, pulse oximetry, re-evaluation of patient's condition and participation in multidisciplinary rounds.  [name], MD [facility] ICU Physician [facility] [place] [facility]  Pager: [phone] Mobile: [phone] After hours: [phone].

## 2021-06-20 NOTE — Procedures (Signed)
Percutaneous Tracheostomy Procedure Note   Chris [name]  [id number]  [birth year]  Date:06/20/21  Time:1:50 PM   Provider Performing:Evalynn Hankins  Procedure: Percutaneous Tracheostomy with Bronchoscopic Guidance (36644)  Indication(s) Anticipated prolonged mechanical ventilation.   Consent Risks of the procedure as well as the alternatives and risks of each were explained to the patient and/or caregiver.  Consent for the procedure was obtained.  Anesthesia Etomidate, Versed, Fentanyl, Vecuronium   Time Out Verified patient identification, verified procedure, site/side was marked, verified correct patient position, special equipment/implants available, medications/allergies/relevant history reviewed, required imaging and test results available.   Sterile Technique Maximal sterile technique including sterile barrier drape, hand hygiene, sterile gown, sterile gloves, mask, hair covering.   Procedure Description Appropriate anatomy identified by palpation.  Patient's neck prepped and draped in sterile fashion.  1% lidocaine with epinephrine was used to anesthetize skin overlying neck.  1.5cm incision made and blunt dissection performed until tracheal rings could be easily palpated.   Then a size 8 Shiley tracheostomy was placed under bronchoscopic visualization using usual Seldinger technique and serial dilation.   Bronchoscope confirmed placement above the carina.  Tracheostomy was sutured in place with adhesive pad to protect skin under pressure.    Patient connected to ventilator.   Complications/Tolerance None; patient tolerated the procedure well. Chest X-ray is ordered to confirm no post-procedural complication.   EBL Minimal   Specimen(s) None   [name], MD [facility] ICU Physician [facility]  Pager: [phone] Or Epic Secure Chat After hours: [phone].  06/20/2021, 1:52 PM

## 2021-06-20 NOTE — Progress Notes (Signed)
Patient ID: Chris [name]., male   DOB: 06/05/1966, 55 y.o.   MRN: [id number]     Advanced Heart Failure Rounding Note  PCP-Cardiologist: Glenetta Hew, MD   Subjective:    11/7: VV ECMO cannulation  This morning, patient is off NE with MAP 70s.  HR 80s in atrial fibrillation, now off amiodarone.   Afebrile, on cefepime/vancomycin.  Oseltamivir for influenza completed.  Solumedrol with severe COPD.   AKI with creatinine 2.37 => 1.85 => 1.63, BUN also trending down.  UOP 2915, urine appears clear in foley. Weight rising.   FiO2 0.4 on vent.   Patient wakes up with sedation wean, agitated.   TFs ongoing.   ECMO: 3300 rpm Flow 4.31 Pvenous -84 DeltaP 25 Sweep 3.5 ABG 7.38/59/78/95% Current sat 96% PTT 40 (goal 60-80), on bivalirudin Lactate 1.6 => 0.8 => 0.8 LDH 323 => 356 => 310  CXR: bibasilar infiltrates, stable cannula position   Objective:   Weight Range: 110.6 kg Body mass index is 36.01 kg/m.   Vital Signs:   Temp:  [98 F (36.7 C)-98.9 F (37.2 C)] 98.5 F (36.9 C) (11/10 0000) Resp:  [6-28] 24 (11/10 0700) BP: (110-116)/(53-56) 116/53 (11/09 1040) SpO2:  [84 %-99 %] 94 % (11/10 0723) Arterial Line BP: (102-156)/(50-70) 113/62 (11/10 0700) FiO2 (%):  [40 %] 40 % (11/10 0723) Weight:  [110.6 kg] 110.6 kg (11/10 0500) Last BM Date: 06/12/21  Weight change: Filed Weights   06/18/21 0500 06/19/21 0500 06/20/21 0500  Weight: 108.8 kg 106.9 kg 110.6 kg    Intake/Output:   Intake/Output Summary (Last 24 hours) at 06/20/2021 0739 Last data filed at 06/20/2021 0700 Gross per 24 hour  Intake 4896.39 ml  Output 2915 ml  Net 1981.39 ml      Physical Exam    General: Intubated/sedated Neck: Crescent catheter left IJ, CVL right IJ, no thyromegaly or thyroid nodule.  Lungs: Distant BS CV: Nondisplaced PMI.  Heart irregular S1/S2, no S3/S4, no murmur.  No peripheral edema.    Abdomen: Soft, nontender, no hepatosplenomegaly, no distention.  Skin:  Intact without lesions or rashes.  Neurologic: Sedated but will awaken.  Extremities: No clubbing or cyanosis.  HEENT: Normal.   Telemetry   Atrial fibrillation 80s (personally reviewed)  Labs    CBC Recent Labs    06/19/21 1541 06/19/21 1551 06/20/21 0303 06/20/21 0310  WBC 13.5*  --  10.2  --   HGB 8.8*   < > 8.8* 8.5*  HCT 28.8*   < > 29.3* 25.0*  MCV 95.7  --  97.7  --   PLT 159  --  153  --    < > = values in this interval not displayed.   Basic Metabolic Panel Recent Labs    07/06/2021 1857 06/22/2021 1905 06/18/21 0329 06/18/21 0334 06/19/21 1541 06/19/21 1551 06/20/21 0303 06/20/21 0310  NA 140   < > 140   < > 146*   < > 149* 154*  K 5.5*   < > 5.1   < > 5.6*   < > 5.4* 5.6*  CL 98  --  97*   < > 109  --  114*  --   CO2 30  --  34*   < > 31  --  30  --   GLUCOSE 239*  --  345*   < > 381*  --  155*  --   BUN 86*  --  85*   < > 97*  --  98*  --   CREATININE 2.79*  --  2.37*   < > 1.69*  --  1.63*  --   CALCIUM 8.0*  --  7.8*   < > 8.3*  --  8.6*  --   MG 2.9*  --  3.1*  --   --   --   --   --    < > = values in this interval not displayed.   Liver Function Tests Recent Labs    06/19/21 0312 06/20/21 0303  AST 112* 79*  ALT 33 36  ALKPHOS 37* 42  BILITOT 1.2 1.0  PROT 5.9* 6.0*  ALBUMIN 1.9* 1.9*   No results for input(s): LIPASE, AMYLASE in the last 72 hours. Cardiac Enzymes No results for input(s): CKTOTAL, CKMB, CKMBINDEX, TROPONINI in the last 72 hours.  BNP: BNP (last 3 results) Recent Labs    10/29/20 1214  BNP 102.9*    ProBNP (last 3 results) No results for input(s): PROBNP in the last 8760 hours.   D-Dimer No results for input(s): DDIMER in the last 72 hours. Hemoglobin A1C No results for input(s): HGBA1C in the last 72 hours. Fasting Lipid Panel Recent Labs    06/19/21 0312  TRIG 188*   Thyroid Function Tests No results for input(s): TSH, T4TOTAL, T3FREE, THYROIDAB in the last 72 hours.  Invalid input(s):  FREET3  Other results:   Imaging    DG CHEST PORT 1 VIEW  Result Date: 06/19/2021 CLINICAL DATA:  Endotracheal tube adjustment.  On ECMO. EXAM: PORTABLE CHEST 1 VIEW COMPARISON:  Chest x-ray from same day at 0512 hours. FINDINGS: Endotracheal tube tip approximately 2.8 cm above the carina. Unchanged feeding tube, ECMO cannula, and left internal jugular central venous catheter. Stable cardiomediastinal silhouette and hazy opacity at both lung bases, likely a combination of layering pleural effusions and atelectasis. No pneumothorax. No acute osseous abnormality. IMPRESSION: 1. Endotracheal tube tip 2.8 cm above the carina. Otherwise stable lines and tubes. 2. Stable layering bilateral pleural effusions and bibasilar atelectasis. Electronically Signed   By: Titus Dubin M.D.   On: 06/19/2021 09:56   DG Abd Portable 1V  Result Date: 06/19/2021 CLINICAL DATA:  Feeding tube placement. EXAM: PORTABLE ABDOMEN - 1 VIEW COMPARISON:  Abdominal x-ray dated June 17, 2021. FINDINGS: Feeding tube tip now in the proximal small bowel near the ligament of Treitz. The bowel gas pattern is normal. No radio-opaque calculi or other significant radiographic abnormality are seen. New ECMO cannula noted with tip in the IVC. IMPRESSION: 1. Feeding tube tip in the proximal small bowel near the ligament of Treitz. Electronically Signed   By: Titus Dubin M.D.   On: 06/19/2021 09:54     Medications:     Scheduled Medications:  albuterol  5 mg Nebulization Once   budesonide (PULMICORT) nebulizer solution  0.25 mg Nebulization BID   chlorhexidine gluconate (MEDLINE KIT)  15 mL Mouth Rinse BID   Chlorhexidine Gluconate Cloth  6 each Topical Daily   clonazePAM  1 mg Per Tube BID   emtricitabine-tenofovir AF  1 tablet Per Tube Daily   And   dolutegravir  50 mg Per Tube Daily   feeding supplement (PROSource TF)  90 mL Per Tube TID   fentaNYL (SUBLIMAZE) injection  200 mcg Intravenous Once    ipratropium-albuterol  3 mL Nebulization Q6H   mouth rinse  15 mL Mouth Rinse 10 times per day   methylPREDNISolone (SOLU-MEDROL) injection  40 mg Intravenous Q24H   metoprolol  tartrate  12.5 mg Per Tube TID   midazolam  5 mg Intravenous Once   mupirocin ointment  1 application Nasal BID   oxyCODONE  5 mg Per Tube Q6H   pantoprazole (PROTONIX) IV  40 mg Intravenous QHS   polyethylene glycol  17 g Per Tube BID   QUEtiapine  50 mg Per Tube BID   rosuvastatin  20 mg Per Tube Daily   senna-docusate  1 tablet Per Tube BID   sodium chloride flush  10-40 mL Intracatheter Q12H   sodium chloride flush  3 mL Intravenous Q12H   vecuronium  10 mg Intravenous Once    Infusions:  sodium chloride     sodium chloride 10 mL/hr at 06/29/2021 1502   sodium chloride     sodium chloride 20 mL/hr at 06/20/21 0700   sodium chloride     bivalirudin (ANGIOMAX) infusion 0.5 mg/mL (Non-ACS indications) 0.23 mg/kg/hr (06/20/21 0700)   ceFEPime (MAXIPIME) IV Stopped (06/20/21 0546)   dexmedetomidine (PRECEDEX) IV infusion 0.9 mcg/kg/hr (06/20/21 0739)   feeding supplement (VITAL 1.5 CAL) Stopped (06/20/21 0500)   fentaNYL infusion INTRAVENOUS 200 mcg/hr (06/20/21 0700)   insulin 15 Units/hr (06/19/21 2225)   norepinephrine (LEVOPHED) Adult infusion Stopped (06/19/21 0952)   vancomycin Stopped (06/19/21 1556)    PRN Medications: sodium chloride, Place/Maintain arterial line **AND** sodium chloride, acetaminophen **OR** acetaminophen, albuterol, bisacodyl, dextrose, docusate, fentaNYL, ondansetron **OR** ondansetron (ZOFRAN) IV, sodium chloride flush, sodium chloride flush   Assessment/Plan   1. Acute hypoxemic and hypercarbic respiratory failure: In setting of COPD on home oxygen at baseline.  He has severe influenza infection.  Refractory hypoxemia and hypercarbia with maximal management.  He has underlying significant lung disease but has reasonable quality of life and had ARDS in the past requiring  tracheostomy but was able to wean off.  RESP score was acceptable.  VV ECMO cannulation on 11/7.  This morning, lactate is 0.8.  ABG 7.38/59/78/95% with sats currently 96%.  Respiratory acidosis resolved.  PaO2 remains low but sats consistently in 90s and improved PaCO2 (respiratory acidosis drove initiation of ECMO).  Sweep is at 3.5.  LDH ok.   - Continue current ECMO support, continue incremental/gradual sweep wean.  - Increase bivalirudin for PTT 60-80.  - No diuresis today with adequate UOP and BUN still high, will likely start IV Lasix tomorrow.   - Antibiotic coverage with cefepime, vancomycin.  - Solumedrol for severe COPD.  - Anticipate prolonged course, plan tracheostomy today.  2. Atrial fibrillation: He has been in atrial fibrillation on all ECGs since 2021, suspect chronic at this point. On Eliquis at home.  - Will manage on bivalirudin.  - Off amiodarone, will start metoprolol 12.5 mg every 8 hrs and stop digoxin.   - Consider cardioversion in future.  3.  AKI: Creatinine 1.6 => 2.37 => 1.85 => 1.63.  BUN is markedly high but coming down.  Urine looks clear, LDH not significantly elevated. K 5.4 today.  - Follow for now, no diuresis today. Will likely need IV Lasix tomorrow.  - Lokelma 5 g x 1 today.  4. Type 2 DM: SSI.  5. Influenza: Severe.  Completed oseltamivir.  6. COPD: Still smokes 1-2 cigs/day.  Intermittent home oxygen.  Followed by pulmonary, has been stable at home for years.  7. HIV: Last viral load was undetectable.  On treatment.  8. Shock: Suspect primarily septic shock.  Echo showed EF 55-60% with normal RV. Now off NE, resolved.  9. FEN: Tube  feeds.  10. Heme: hgb 8.8 today, had 2 units PRBCs 11/9. 11. Hypernatremia: Start free water 200 cc every 8 hrs.   CRITICAL CARE Performed by: Loralie Champagne  Total critical care time: 40 minutes  Critical care time was exclusive of separately billable procedures and treating other patients.  Critical care was  necessary to treat or prevent imminent or life-threatening deterioration.  Critical care was time spent personally by me on the following activities: development of treatment plan with patient and/or surrogate as well as nursing, discussions with consultants, evaluation of patient's response to treatment, examination of patient, obtaining history from patient or surrogate, ordering and performing treatments and interventions, ordering and review of laboratory studies, ordering and review of radiographic studies, pulse oximetry and re-evaluation of patient's condition.     Length of Stay: 7  Loralie Champagne, MD  06/20/2021, 7:39 AM  Advanced Heart Failure Team Pager [phone] (M-F; 7a - 5p)  Please contact [facility] for night-coverage after hours (5p -7a ) and weekends on [email or web address]

## 2021-06-20 NOTE — Progress Notes (Signed)
PT Cancellation Note  Patient Details Name: Chris [name]. MRN: [id number] DOB: 1966/02/06   Cancelled Treatment:    Reason Eval/Treat Not Completed: Patient not medically ready at this time. Per discussion with MD and RN, pt remaining sedated until planned trach later this afternoon. PT will continue to follow and evaluate after trach placement.   Vickki Muff, PT, DPT   Acute Rehabilitation Department Pager #: [phone]   Ronnie Derby 06/20/2021, 8:33 AM

## 2021-06-20 NOTE — Progress Notes (Signed)
OT Cancellation Note  Patient Details Name: Chris [name]. MRN: [id number] DOB: [birth year]   Cancelled Treatment:    Reason Eval/Treat Not Completed: Patient not medically ready  [name] 06/20/2021, 8:30 AM [name], OTR/L Acute Rehabilitation Services Pager: [phone] Office: [phone]

## 2021-06-20 NOTE — Progress Notes (Addendum)
ANTICOAGULATION CONSULT NOTE   Pharmacy Consult for Bivalirudin Indication:  ECMO and atrial fibrillation  Allergies  Allergen Reactions   Bactrim [Sulfamethoxazole-Trimethoprim] Hives    Patient Measurements: Height: 5\' 9"  (175.3 cm) Weight: 110.6 kg (243 lb 13.3 oz) (new bed) IBW/kg (Calculated) : 70.7  Vital Signs: Temp: 97.3 F (36.3 C) (11/10 0800) Temp Source: Core (11/10 0800)  Labs: Recent Labs    06/16/2021 1233 06/29/2021 1642 06/15/2021 1857 06/28/2021 1905 06/18/21 0329 06/18/21 0334 06/19/21 0312 06/19/21 0317 06/19/21 1541 06/19/21 1551 06/19/21 1857 06/19/21 1958 06/20/21 0108 06/20/21 0303 06/20/21 0310 06/20/21 0800  HGB  --    < > 10.0*   < > 9.6*   < > 8.0*   < > 8.8*   < >  --  8.8*  --  8.8* 8.5*  --   HCT  --    < > 34.2*   < > 32.5*   < > 26.7*   < > 28.8*   < >  --  26.0*  --  29.3* 25.0*  --   PLT  --   --  169   < > 163   < > 178  --  159  --   --   --   --  153  --   --   APTT  --   --  75*   < > 49*   < > 52*   < >  --   --  55*  --  40*  --   --  51*  LABPROT  --    < > 20.0*  --  23.1*  --  19.8*  --   --   --   --   --   --  20.3*  --   --   INR  --    < > 1.7*  --  2.1*  --  1.7*  --   --   --   --   --   --  1.7*  --   --   HEPARINUNFRC >1.10*  --  >1.10*  --   --   --   --   --   --   --   --   --   --   --   --   --   CREATININE  --    < > 2.79*  --  2.37*   < > 1.85*  --  1.69*  --   --   --   --  1.63*  --   --    < > = values in this interval not displayed.     Estimated Creatinine Clearance: 62.8 mL/min (A) (by C-G formula based on SCr of 1.63 mg/dL (H)).   Medical History: Past Medical History:  Diagnosis Date   Anxiety    Atrial fibrillation (HCC)    CHF (congestive heart failure) (HCC)    COPD (chronic obstructive pulmonary disease) (HCC)    Emphysema [[name]]   Depression    Diabetes mellitus without complication (HCC)    type 2   Emphysema of lung (HCC)    GERD (gastroesophageal reflux disease)    HIV disease (HCC)     Hypertension    Oxygen deficiency     Assessment: 55 Y/O male on chronic apixaban for atrial fibrillation. Previous placed on heparin in anticipation of line placement and possible procedures.  Last apixaban dose given @0948  11/7. Patient cannulated and ECMO initiated 11/7; received a heparin  bolus at cannulation, then switched to bivalrudin infusion. Pharmacy to dose bivalirudin.  aPTT is slightly below goal at 51 after increase to bivalrudin 0.23 mg/kg/hr. CBC stable - hgb low 8's, plt normal. Getting trach placed today at 1pm.  Goal of Therapy:  aPTT 60-80 secs Monitor platelets by anticoagulation protocol: Yes   Plan:  F/u restart after trach placed Will increase bivalirudin to 0.28 mg/kg/hr (25% increase) Check aPTT in 4 hours  Filbert Schilder, PharmD PGY1 Pharmacy Resident 06/20/2021  10:27 AM  Please check [email or web address] for unit-specific pharmacy phone numbers.

## 2021-06-20 NOTE — Progress Notes (Signed)
Patient ID: Chris [name]., male   DOB: 06/16/1966, 55 y.o.   MRN: [id number]  Extracorporeal support note     ECLS support day: 3 Indication: Severe COPD with influenza A   Configuration: VV ECMO, Crescent catheter right IJ   Drainage cannula: Right IJ Crescent Return cannula: Right IJ Crescent   Pump speed: 3300 Pump flow: 4.31 Pump used: Cardiohelp   Sweep gas: 3.5   Circuit check: Small area of thrombus 3 o'clock  Anticoagulant: bivalirudin Anticoagulation target: PTT 60-80   Changes in support: Try to drop sweep incrementally. Tracheostomy today.    Anticipated goals/duration of support: wean to decannulation  [name] 06/20/2021 7:38 AM

## 2021-06-20 NOTE — Progress Notes (Signed)
ANTICOAGULATION CONSULT NOTE - Follow Up Consult  Pharmacy Consult for bivalirudin Indication:  Afib/ECMO  Labs: Recent Labs    06/28/2021 1233 07/06/2021 1642 07/02/2021 1857 06/22/2021 1905 06/18/21 0329 06/18/21 0334 06/18/21 1623 06/18/21 1632 06/18/21 2014 06/18/21 2020 06/19/21 0312 06/19/21 0317 06/19/21 1342 06/19/21 1413 06/19/21 1541 06/19/21 1551 06/19/21 1857 06/19/21 1958 06/20/21 0108  HGB  --    < > 10.0*   < > 9.6*   < > 8.7*   < > 8.2*   < > 8.0*   < >  --    < > 8.8* 8.8*  --  8.8*  --   HCT  --    < > 34.2*   < > 32.5*   < > 29.6*   < > 28.2*   < > 26.7*   < >  --    < > 28.8* 26.0*  --  26.0*  --   PLT  --   --  169   < > 163  --  182  --  171  --  178  --   --   --  159  --   --   --   --   APTT  --   --  75*   < > 49*   < > 54*  --  53*  --  52*   < > 54*  --   --   --  55*  --  40*  LABPROT  --   --  20.0*  --  23.1*  --   --   --   --   --  19.8*  --   --   --   --   --   --   --   --   INR  --   --  1.7*  --  2.1*  --   --   --   --   --  1.7*  --   --   --   --   --   --   --   --   HEPARINUNFRC >1.10*  --  >1.10*  --   --   --   --   --   --   --   --   --   --   --   --   --   --   --   --   CREATININE  --   --  2.79*  --  2.37*  --  2.15*  --   --   --  1.85*  --   --   --  1.69*  --   --   --   --    < > = values in this interval not displayed.    Assessment: 55yo male subtherapeutic on bivalirudin with lower PTT despite increased rate; no infusion issues or signs of bleeding per RN.  Goal of Therapy:  aPTT 60-80 seconds   Plan:  Will increase bivalirudin infusion by 25% units/kg/hr to 0.23 mg/kg/hr and check PTT in 4 hours.    Vernard Gambles, PharmD, BCPS  06/20/2021,2:40 AM

## 2021-06-21 ENCOUNTER — Inpatient Hospital Stay (HOSPITAL_COMMUNITY): Payer: Medicare HMO

## 2021-06-21 DIAGNOSIS — J9621 Acute and chronic respiratory failure with hypoxia: Secondary | ICD-10-CM | POA: Diagnosis not present

## 2021-06-21 DIAGNOSIS — J9622 Acute and chronic respiratory failure with hypercapnia: Secondary | ICD-10-CM | POA: Diagnosis not present

## 2021-06-21 DIAGNOSIS — J101 Influenza due to other identified influenza virus with other respiratory manifestations: Secondary | ICD-10-CM | POA: Diagnosis not present

## 2021-06-21 LAB — POCT I-STAT 7, (LYTES, BLD GAS, ICA,H+H)
Acid-Base Excess: 4 mmol/L — ABNORMAL HIGH (ref 0.0–2.0)
Acid-Base Excess: 5 mmol/L — ABNORMAL HIGH (ref 0.0–2.0)
Acid-Base Excess: 5 mmol/L — ABNORMAL HIGH (ref 0.0–2.0)
Acid-Base Excess: 4 mmol/L — ABNORMAL HIGH (ref 0.0–2.0)
Acid-Base Excess: 4 mmol/L — ABNORMAL HIGH (ref 0.0–2.0)
Acid-Base Excess: 4 mmol/L — ABNORMAL HIGH (ref 0.0–2.0)
Acid-Base Excess: 5 mmol/L — ABNORMAL HIGH (ref 0.0–2.0)
Acid-Base Excess: 3 mmol/L — ABNORMAL HIGH (ref 0.0–2.0)
Acid-Base Excess: 3 mmol/L — ABNORMAL HIGH (ref 0.0–2.0)
Acid-Base Excess: 2 mmol/L (ref 0.0–2.0)
Bicarbonate: 30.7 mmol/L — ABNORMAL HIGH (ref 20.0–28.0)
Bicarbonate: 31.1 mmol/L — ABNORMAL HIGH (ref 20.0–28.0)
Bicarbonate: 31.6 mmol/L — ABNORMAL HIGH (ref 20.0–28.0)
Bicarbonate: 29.3 mmol/L — ABNORMAL HIGH (ref 20.0–28.0)
Bicarbonate: 29.9 mmol/L — ABNORMAL HIGH (ref 20.0–28.0)
Bicarbonate: 31 mmol/L — ABNORMAL HIGH (ref 20.0–28.0)
Bicarbonate: 32 mmol/L — ABNORMAL HIGH (ref 20.0–28.0)
Bicarbonate: 29.9 mmol/L — ABNORMAL HIGH (ref 20.0–28.0)
Bicarbonate: 29.4 mmol/L — ABNORMAL HIGH (ref 20.0–28.0)
Bicarbonate: 28.3 mmol/L — ABNORMAL HIGH (ref 20.0–28.0)
Calcium, Ion: 1.31 mmol/L (ref 1.15–1.40)
Calcium, Ion: 1.37 mmol/L (ref 1.15–1.40)
Calcium, Ion: 1.39 mmol/L (ref 1.15–1.40)
Calcium, Ion: 1.29 mmol/L (ref 1.15–1.40)
Calcium, Ion: 1.35 mmol/L (ref 1.15–1.40)
Calcium, Ion: 1.31 mmol/L (ref 1.15–1.40)
Calcium, Ion: 1.35 mmol/L (ref 1.15–1.40)
Calcium, Ion: 1.33 mmol/L (ref 1.15–1.40)
Calcium, Ion: 1.31 mmol/L (ref 1.15–1.40)
Calcium, Ion: 1.34 mmol/L (ref 1.15–1.40)
HCT: 22 % — ABNORMAL LOW (ref 39.0–52.0)
HCT: 22 % — ABNORMAL LOW (ref 39.0–52.0)
HCT: 24 % — ABNORMAL LOW (ref 39.0–52.0)
HCT: 24 % — ABNORMAL LOW (ref 39.0–52.0)
HCT: 25 % — ABNORMAL LOW (ref 39.0–52.0)
HCT: 25 % — ABNORMAL LOW (ref 39.0–52.0)
HCT: 26 % — ABNORMAL LOW (ref 39.0–52.0)
HCT: 24 % — ABNORMAL LOW (ref 39.0–52.0)
HCT: 24 % — ABNORMAL LOW (ref 39.0–52.0)
HCT: 25 % — ABNORMAL LOW (ref 39.0–52.0)
Hemoglobin: 7.5 g/dL — ABNORMAL LOW (ref 13.0–17.0)
Hemoglobin: 7.5 g/dL — ABNORMAL LOW (ref 13.0–17.0)
Hemoglobin: 8.2 g/dL — ABNORMAL LOW (ref 13.0–17.0)
Hemoglobin: 8.2 g/dL — ABNORMAL LOW (ref 13.0–17.0)
Hemoglobin: 8.5 g/dL — ABNORMAL LOW (ref 13.0–17.0)
Hemoglobin: 8.5 g/dL — ABNORMAL LOW (ref 13.0–17.0)
Hemoglobin: 8.8 g/dL — ABNORMAL LOW (ref 13.0–17.0)
Hemoglobin: 8.2 g/dL — ABNORMAL LOW (ref 13.0–17.0)
Hemoglobin: 8.2 g/dL — ABNORMAL LOW (ref 13.0–17.0)
Hemoglobin: 8.5 g/dL — ABNORMAL LOW (ref 13.0–17.0)
O2 Saturation: 92 %
O2 Saturation: 93 %
O2 Saturation: 88 %
O2 Saturation: 90 %
O2 Saturation: 99 %
O2 Saturation: 86 %
O2 Saturation: 91 %
O2 Saturation: 91 %
O2 Saturation: 93 %
O2 Saturation: 88 %
Patient temperature: 98.9
Patient temperature: 36.7
Patient temperature: 36.8
Patient temperature: 36.7
Patient temperature: 36.8
Patient temperature: 36.7
Patient temperature: 36.8
Patient temperature: 36.8
Patient temperature: 36.7
Patient temperature: 98.6
Potassium: 4.9 mmol/L (ref 3.5–5.1)
Potassium: 4.8 mmol/L (ref 3.5–5.1)
Potassium: 4.8 mmol/L (ref 3.5–5.1)
Potassium: 5 mmol/L (ref 3.5–5.1)
Potassium: 5.2 mmol/L — ABNORMAL HIGH (ref 3.5–5.1)
Potassium: 5.4 mmol/L — ABNORMAL HIGH (ref 3.5–5.1)
Potassium: 5.6 mmol/L — ABNORMAL HIGH (ref 3.5–5.1)
Potassium: 5.6 mmol/L — ABNORMAL HIGH (ref 3.5–5.1)
Potassium: 5.5 mmol/L — ABNORMAL HIGH (ref 3.5–5.1)
Potassium: 5.4 mmol/L — ABNORMAL HIGH (ref 3.5–5.1)
Sodium: 158 mmol/L — ABNORMAL HIGH (ref 135–145)
Sodium: 158 mmol/L — ABNORMAL HIGH (ref 135–145)
Sodium: 158 mmol/L — ABNORMAL HIGH (ref 135–145)
Sodium: 156 mmol/L — ABNORMAL HIGH (ref 135–145)
Sodium: 157 mmol/L — ABNORMAL HIGH (ref 135–145)
Sodium: 156 mmol/L — ABNORMAL HIGH (ref 135–145)
Sodium: 156 mmol/L — ABNORMAL HIGH (ref 135–145)
Sodium: 155 mmol/L — ABNORMAL HIGH (ref 135–145)
Sodium: 155 mmol/L — ABNORMAL HIGH (ref 135–145)
Sodium: 155 mmol/L — ABNORMAL HIGH (ref 135–145)
TCO2: 32 mmol/L (ref 22–32)
TCO2: 33 mmol/L — ABNORMAL HIGH (ref 22–32)
TCO2: 33 mmol/L — ABNORMAL HIGH (ref 22–32)
TCO2: 31 mmol/L (ref 22–32)
TCO2: 31 mmol/L (ref 22–32)
TCO2: 33 mmol/L — ABNORMAL HIGH (ref 22–32)
TCO2: 34 mmol/L — ABNORMAL HIGH (ref 22–32)
TCO2: 32 mmol/L (ref 22–32)
TCO2: 31 mmol/L (ref 22–32)
TCO2: 30 mmol/L (ref 22–32)
pCO2 arterial: 56 mmHg — ABNORMAL HIGH (ref 32.0–48.0)
pCO2 arterial: 54.4 mmHg — ABNORMAL HIGH (ref 32.0–48.0)
pCO2 arterial: 56.7 mmHg — ABNORMAL HIGH (ref 32.0–48.0)
pCO2 arterial: 46.5 mmHg (ref 32.0–48.0)
pCO2 arterial: 52.2 mmHg — ABNORMAL HIGH (ref 32.0–48.0)
pCO2 arterial: 57.2 mmHg — ABNORMAL HIGH (ref 32.0–48.0)
pCO2 arterial: 63.4 mmHg — ABNORMAL HIGH (ref 32.0–48.0)
pCO2 arterial: 55.8 mmHg — ABNORMAL HIGH (ref 32.0–48.0)
pCO2 arterial: 53.3 mmHg — ABNORMAL HIGH (ref 32.0–48.0)
pCO2 arterial: 54.4 mmHg — ABNORMAL HIGH (ref 32.0–48.0)
pH, Arterial: 7.347 — ABNORMAL LOW (ref 7.350–7.450)
pH, Arterial: 7.364 (ref 7.350–7.450)
pH, Arterial: 7.353 (ref 7.350–7.450)
pH, Arterial: 7.364 (ref 7.350–7.450)
pH, Arterial: 7.406 (ref 7.350–7.450)
pH, Arterial: 7.31 — ABNORMAL LOW (ref 7.350–7.450)
pH, Arterial: 7.341 — ABNORMAL LOW (ref 7.350–7.450)
pH, Arterial: 7.336 — ABNORMAL LOW (ref 7.350–7.450)
pH, Arterial: 7.348 — ABNORMAL LOW (ref 7.350–7.450)
pH, Arterial: 7.324 — ABNORMAL LOW (ref 7.350–7.450)
pO2, Arterial: 71 mmHg — ABNORMAL LOW (ref 83.0–108.0)
pO2, Arterial: 68 mmHg — ABNORMAL LOW (ref 83.0–108.0)
pO2, Arterial: 57 mmHg — ABNORMAL LOW (ref 83.0–108.0)
pO2, Arterial: 157 mmHg — ABNORMAL HIGH (ref 83.0–108.0)
pO2, Arterial: 61 mmHg — ABNORMAL LOW (ref 83.0–108.0)
pO2, Arterial: 57 mmHg — ABNORMAL LOW (ref 83.0–108.0)
pO2, Arterial: 66 mmHg — ABNORMAL LOW (ref 83.0–108.0)
pO2, Arterial: 67 mmHg — ABNORMAL LOW (ref 83.0–108.0)
pO2, Arterial: 69 mmHg — ABNORMAL LOW (ref 83.0–108.0)
pO2, Arterial: 59 mmHg — ABNORMAL LOW (ref 83.0–108.0)

## 2021-06-21 LAB — POCT I-STAT EG7
Acid-Base Excess: 4 mmol/L — ABNORMAL HIGH (ref 0.0–2.0)
Bicarbonate: 30.4 mmol/L — ABNORMAL HIGH (ref 20.0–28.0)
Calcium, Ion: 1.32 mmol/L (ref 1.15–1.40)
HCT: 24 % — ABNORMAL LOW (ref 39.0–52.0)
Hemoglobin: 8.2 g/dL — ABNORMAL LOW (ref 13.0–17.0)
O2 Saturation: 50 %
Patient temperature: 36.8
Potassium: 5.1 mmol/L (ref 3.5–5.1)
Sodium: 156 mmol/L — ABNORMAL HIGH (ref 135–145)
TCO2: 32 mmol/L (ref 22–32)
pCO2, Ven: 57.4 mmHg (ref 44.0–60.0)
pH, Ven: 7.332 (ref 7.250–7.430)
pO2, Ven: 29 mmHg — CL (ref 32.0–45.0)

## 2021-06-21 LAB — BASIC METABOLIC PANEL
Anion gap: 5 (ref 5–15)
Anion gap: 3 — ABNORMAL LOW (ref 5–15)
BUN: 75 mg/dL — ABNORMAL HIGH (ref 6–20)
BUN: 74 mg/dL — ABNORMAL HIGH (ref 6–20)
CO2: 30 mmol/L (ref 22–32)
CO2: 29 mmol/L (ref 22–32)
Calcium: 8.9 mg/dL (ref 8.9–10.3)
Calcium: 8.9 mg/dL (ref 8.9–10.3)
Chloride: 122 mmol/L — ABNORMAL HIGH (ref 98–111)
Chloride: 120 mmol/L — ABNORMAL HIGH (ref 98–111)
Creatinine, Ser: 1.27 mg/dL — ABNORMAL HIGH (ref 0.61–1.24)
Creatinine, Ser: 1.34 mg/dL — ABNORMAL HIGH (ref 0.61–1.24)
GFR, Estimated: >60 mL/min (ref >60)
GFR, Estimated: >60 mL/min (ref >60)
Glucose, Bld: 154 mg/dL — ABNORMAL HIGH (ref 70–99)
Glucose, Bld: 191 mg/dL — ABNORMAL HIGH (ref 70–99)
Potassium: 5.1 mmol/L (ref 3.5–5.1)
Potassium: 5.6 mmol/L — ABNORMAL HIGH (ref 3.5–5.1)
Sodium: 157 mmol/L — ABNORMAL HIGH (ref 135–145)
Sodium: 152 mmol/L — ABNORMAL HIGH (ref 135–145)

## 2021-06-21 LAB — GLUCOSE, CAPILLARY
Glucose-Capillary: 123 mg/dL — ABNORMAL HIGH (ref 70–99)
Glucose-Capillary: 136 mg/dL — ABNORMAL HIGH (ref 70–99)
Glucose-Capillary: 137 mg/dL — ABNORMAL HIGH (ref 70–99)
Glucose-Capillary: 143 mg/dL — ABNORMAL HIGH (ref 70–99)
Glucose-Capillary: 150 mg/dL — ABNORMAL HIGH (ref 70–99)
Glucose-Capillary: 155 mg/dL — ABNORMAL HIGH (ref 70–99)
Glucose-Capillary: 156 mg/dL — ABNORMAL HIGH (ref 70–99)
Glucose-Capillary: 157 mg/dL — ABNORMAL HIGH (ref 70–99)
Glucose-Capillary: 159 mg/dL — ABNORMAL HIGH (ref 70–99)
Glucose-Capillary: 165 mg/dL — ABNORMAL HIGH (ref 70–99)
Glucose-Capillary: 168 mg/dL — ABNORMAL HIGH (ref 70–99)
Glucose-Capillary: 170 mg/dL — ABNORMAL HIGH (ref 70–99)
Glucose-Capillary: 171 mg/dL — ABNORMAL HIGH (ref 70–99)
Glucose-Capillary: 172 mg/dL — ABNORMAL HIGH (ref 70–99)
Glucose-Capillary: 174 mg/dL — ABNORMAL HIGH (ref 70–99)
Glucose-Capillary: 180 mg/dL — ABNORMAL HIGH (ref 70–99)
Glucose-Capillary: 183 mg/dL — ABNORMAL HIGH (ref 70–99)
Glucose-Capillary: 195 mg/dL — ABNORMAL HIGH (ref 70–99)
Glucose-Capillary: 197 mg/dL — ABNORMAL HIGH (ref 70–99)

## 2021-06-21 LAB — APTT
aPTT: 61 seconds — ABNORMAL HIGH (ref 24–36)
aPTT: 66 seconds — ABNORMAL HIGH (ref 24–36)

## 2021-06-21 LAB — CBC
HCT: 30.5 % — ABNORMAL LOW (ref 39.0–52.0)
HCT: 27.8 % — ABNORMAL LOW (ref 39.0–52.0)
HCT: 30.5 % — ABNORMAL LOW (ref 39.0–52.0)
Hemoglobin: 8.6 g/dL — ABNORMAL LOW (ref 13.0–17.0)
Hemoglobin: 8.1 g/dL — ABNORMAL LOW (ref 13.0–17.0)
Hemoglobin: 8.8 g/dL — ABNORMAL LOW (ref 13.0–17.0)
MCH: 28.2 pg (ref 26.0–34.0)
MCH: 29.2 pg (ref 26.0–34.0)
MCH: 29 pg (ref 26.0–34.0)
MCHC: 28.2 g/dL — ABNORMAL LOW (ref 30.0–36.0)
MCHC: 29.1 g/dL — ABNORMAL LOW (ref 30.0–36.0)
MCHC: 28.9 g/dL — ABNORMAL LOW (ref 30.0–36.0)
MCV: 100 fL (ref 80.0–100.0)
MCV: 100.4 fL — ABNORMAL HIGH (ref 80.0–100.0)
MCV: 100.7 fL — ABNORMAL HIGH (ref 80.0–100.0)
Platelets: 125 10*3/uL — ABNORMAL LOW (ref 150–400)
Platelets: 135 10*3/uL — ABNORMAL LOW (ref 150–400)
Platelets: 140 10*3/uL — ABNORMAL LOW (ref 150–400)
RBC: 3.05 MIL/uL — ABNORMAL LOW (ref 4.22–5.81)
RBC: 2.77 MIL/uL — ABNORMAL LOW (ref 4.22–5.81)
RBC: 3.03 MIL/uL — ABNORMAL LOW (ref 4.22–5.81)
RDW: 15.3 % (ref 11.5–15.5)
RDW: 15.3 % (ref 11.5–15.5)
RDW: 15.9 % — ABNORMAL HIGH (ref 11.5–15.5)
WBC: 8.8 10*3/uL (ref 4.0–10.5)
WBC: 11.2 10*3/uL — ABNORMAL HIGH (ref 4.0–10.5)
WBC: 13.1 10*3/uL — ABNORMAL HIGH (ref 4.0–10.5)
nRBC: 0.5 % — ABNORMAL HIGH (ref 0.0–0.2)
nRBC: 0.5 % — ABNORMAL HIGH (ref 0.0–0.2)
nRBC: 0.4 % — ABNORMAL HIGH (ref 0.0–0.2)

## 2021-06-21 LAB — HEPATIC FUNCTION PANEL
ALT: 42 U/L (ref 0–44)
AST: 66 U/L — ABNORMAL HIGH (ref 15–41)
Albumin: 1.9 g/dL — ABNORMAL LOW (ref 3.5–5.0)
Alkaline Phosphatase: 38 U/L (ref 38–126)
Bilirubin, Direct: 0.2 mg/dL (ref 0.0–0.2)
Indirect Bilirubin: 0.6 mg/dL (ref 0.3–0.9)
Total Bilirubin: 0.8 mg/dL (ref 0.3–1.2)
Total Protein: 6 g/dL — ABNORMAL LOW (ref 6.5–8.1)

## 2021-06-21 LAB — FIBRINOGEN: Fibrinogen: 796 mg/dL — ABNORMAL HIGH (ref 210–475)

## 2021-06-21 LAB — PREPARE RBC (CROSSMATCH)

## 2021-06-21 LAB — PROTIME-INR
INR: 1.9 — ABNORMAL HIGH (ref 0.8–1.2)
Prothrombin Time: 21.7 seconds — ABNORMAL HIGH (ref 11.4–15.2)

## 2021-06-21 LAB — LACTATE DEHYDROGENASE: LDH: 278 U/L — ABNORMAL HIGH (ref 98–192)

## 2021-06-21 LAB — LACTIC ACID, PLASMA
Lactic Acid, Venous: 0.7 mmol/L (ref 0.5–1.9)
Lactic Acid, Venous: 0.9 mmol/L (ref 0.5–1.9)

## 2021-06-21 MED ORDER — FUROSEMIDE 10 MG/ML IJ SOLN
20.0000 mg | Freq: Once | INTRAMUSCULAR | Status: AC
  Administered 2021-06-21: 20 mg via INTRAVENOUS
  Filled 2021-06-21: qty 2

## 2021-06-21 MED ORDER — SODIUM CHLORIDE 0.9% IV SOLUTION: Freq: Once | INTRAVENOUS | Status: AC

## 2021-06-21 MED ORDER — FREE WATER
250.0000 mL | Status: DC
Start: 2021-06-21 — End: 2021-06-23
  Administered 2021-06-21 – 2021-06-23 (×12): 250 mL

## 2021-06-21 NOTE — Progress Notes (Signed)
Patient ID: Chris [name]., male   DOB: 1966-07-07, 56 y.o.   MRN: [id number]  Extracorporeal support note     ECLS support day: 4 Indication: Severe COPD with influenza A   Configuration: VV ECMO, Crescent catheter right IJ   Drainage cannula: Right IJ Crescent Return cannula: Right IJ Crescent   Pump speed: 3300 Pump flow: 4.38 Pump used: Cardiohelp   Sweep gas: 4   Circuit check: Small area of thrombus 3 o'clock  Anticoagulant: bivalirudin Anticoagulation target: PTT 60-80   Changes in support: Try to drop sweep incrementally.    Anticipated goals/duration of support: wean to decannulation  [name] 06/21/2021 7:41 AM

## 2021-06-21 NOTE — Progress Notes (Signed)
ANTICOAGULATION CONSULT NOTE   Pharmacy Consult for Bivalirudin Indication:  ECMO and atrial fibrillation  Allergies  Allergen Reactions   Bactrim [Sulfamethoxazole-Trimethoprim] Hives    Patient Measurements: Height: 5\' 9"  (175.3 cm) Weight: 114 kg (251 lb 5.2 oz) IBW/kg (Calculated) : 70.7  Vital Signs: Temp: 98.9 F (37.2 C) (11/10 2000) Temp Source: Oral (11/10 2000) BP: 108/58 (11/10 2327) Pulse Rate: 98 (11/11 0320)  Labs: Recent Labs    06/19/21 0312 06/19/21 0317 06/20/21 0303 06/20/21 0310 06/20/21 0800 06/20/21 0811 06/20/21 1700 06/20/21 1725 06/20/21 1958 06/20/21 2002 06/21/21 0408 06/21/21 0411  HGB 8.0*   < > 8.8*   < >  --    < > 8.4*   < > 8.2*  --  7.5* 8.6*  HCT 26.7*   < > 29.3*   < >  --    < > 29.0*   < > 24.0*  --  22.0* 30.5*  PLT 178   < > 153  --   --   --  138*  --   --   --   --  125*  APTT 52*   < >  --   --  51*  --   --   --   --  60*  --  61*  LABPROT 19.8*  --  20.3*  --   --   --   --   --   --   --   --  21.7*  INR 1.7*  --  1.7*  --   --   --   --   --   --   --   --  1.9*  CREATININE 1.85*   < > 1.63*  --   --   --  1.31*  --   --   --   --  1.27*   < > = values in this interval not displayed.     Estimated Creatinine Clearance: 81.8 mL/min (A) (by C-G formula based on SCr of 1.27 mg/dL (H)).   Medical History: Past Medical History:  Diagnosis Date   Anxiety    Atrial fibrillation (HCC)    CHF (congestive heart failure) (HCC)    COPD (chronic obstructive pulmonary disease) (HCC)    Emphysema [[name]]   Depression    Diabetes mellitus without complication (HCC)    type 2   Emphysema of lung (HCC)    GERD (gastroesophageal reflux disease)    HIV disease (HCC)    Hypertension    Oxygen deficiency     Assessment: 55 Y/O male on chronic apixaban for atrial fibrillation. Previous placed on heparin in anticipation of line placement and possible procedures.  Last apixaban dose given @0948  11/7. Patient cannulated and  ECMO initiated 11/7; received a heparin bolus at cannulation, then switched to bivalrudin infusion. Pharmacy to dose bivalirudin.  aPTT 61sec at goal after increase to bivalrudin 0.28mg /kg/hr. CBC stable - hgb 8's, plt low but stable, LDH stable 200-300s. S/p trach placed 11/10.   Goal of Therapy:  aPTT 60-80 secs Monitor platelets by anticoagulation protocol: Yes   Plan:  Continue bivalirudin to 0.28 mg/kg/hr  Aptt and CBC q12hr   13/7, PharmD PGY1 Pharmacy Resident 06/21/2021  5:24 AM  Please check [email or web address] for unit-specific pharmacy phone numbers.

## 2021-06-21 NOTE — Progress Notes (Signed)
ANTICOAGULATION CONSULT NOTE   Pharmacy Consult for Bivalirudin Indication:  ECMO and atrial fibrillation  Allergies  Allergen Reactions   Bactrim [Sulfamethoxazole-Trimethoprim] Hives    Patient Measurements: Height: 5\' 9"  (175.3 cm) Weight: 114 kg (251 lb 5.2 oz) IBW/kg (Calculated) : 70.7  Vital Signs: Temp: 98.2 F (36.8 C) (11/11 1600) Temp Source: Core (11/11 1600) BP: 121/62 (11/11 1224) Pulse Rate: 108 (11/11 1446)  Labs: Recent Labs    06/19/21 0312 06/19/21 0317 06/20/21 0303 06/20/21 0310 06/20/21 1700 06/20/21 1725 06/20/21 2002 06/21/21 0408 06/21/21 0411 06/21/21 0803 06/21/21 0904 06/21/21 0936 06/21/21 1603 06/21/21 1707 06/21/21 1805  HGB 8.0*   < > 8.8*   < > 8.4*   < >  --    < > 8.6*   < > 8.1*   < > 8.8* 8.2* 8.2*  HCT 26.7*   < > 29.3*   < > 29.0*   < >  --    < > 30.5*   < > 27.8*   < > 30.5* 24.0* 24.0*  PLT 178   < > 153  --  138*  --   --   --  125*  --  135*  --  140*  --   --   APTT 52*   < >  --    < >  --   --  60*  --  61*  --   --   --  66*  --   --   LABPROT 19.8*  --  20.3*  --   --   --   --   --  21.7*  --   --   --   --   --   --   INR 1.7*  --  1.7*  --   --   --   --   --  1.9*  --   --   --   --   --   --   CREATININE 1.85*   < > 1.63*  --  1.31*  --   --   --  1.27*  --   --   --  1.34*  --   --    < > = values in this interval not displayed.     Estimated Creatinine Clearance: 77.5 mL/min (A) (by C-G formula based on SCr of 1.34 mg/dL (H)).   Medical History: Past Medical History:  Diagnosis Date   Anxiety    Atrial fibrillation (HCC)    CHF (congestive heart failure) (HCC)    COPD (chronic obstructive pulmonary disease) (HCC)    Emphysema [[name]]   Depression    Diabetes mellitus without complication (HCC)    type 2   Emphysema of lung (HCC)    GERD (gastroesophageal reflux disease)    HIV disease (HCC)    Hypertension    Oxygen deficiency     Assessment: 55 Y/O male on chronic apixaban for atrial  fibrillation. Previous placed on heparin in anticipation of line placement and possible procedures.  Last apixaban dose given @0948  11/7. Patient cannulated and ECMO initiated 11/7; received a heparin bolus at cannulation, then switched to bivalrudin infusion. Pharmacy to dose bivalirudin.  aPTT 61sec at goal after increase to bivalrudin 0.28mg /kg/hr. CBC stable - hgb 8's, plt low but stable, LDH stable 200-300s. S/p trach placed 11/10.   PM update, aPTT 66 sec  Goal of Therapy:  aPTT 60-80 secs Monitor platelets by anticoagulation protocol: Yes   Plan:  Continue bivalirudin to 0.28 mg/kg/hr  Aptt and CBC q12hr   Latroy Gaymon A. Jeanella Craze, PharmD, BCPS, FNKF Clinical Pharmacist [place] Please utilize Amion for appropriate phone number to reach the unit pharmacist [facility])  06/21/2021  7:05 PM  Please check [email or web address] for unit-specific pharmacy phone numbers.

## 2021-06-21 NOTE — Progress Notes (Signed)
PT Cancellation Note  Patient Details Name: Chris [name]. MRN: [id number] DOB: 04/30/1966   Cancelled Treatment:    Reason Eval/Treat Not Completed: Patient not medically ready. RN asking to defer, pt with agitation when sedation reduced. Will continue to follow and initiate PT when appropriate.    Angelina Ok [facility] 06/21/2021, 8:38 AM Skip Mayer PT Acute Rehabilitation Services Pager [phone] Office [phone]

## 2021-06-21 NOTE — Progress Notes (Addendum)
Patient ID: Chris Round., male   DOB: Apr 16, 1966, 55 y.o.   MRN: [id number]     Advanced Heart Failure Rounding Note  PCP-Cardiologist: Glenetta Hew, MD   Subjective:    11/7: VV ECMO cannulation 11/10: Tracheostomy  This morning, patient is off NE with MAP 70s.  HR 90s-100s in atrial fibrillation, now on metoprolol 12.5 q8 hrs.   Afebrile, on cefepime.  Oseltamivir for influenza completed.  Solumedrol with severe COPD.   AKI with creatinine 2.37 => 1.85 => 1.63 => 1.27, BUN also trending down.  UOP [id number], urine appears clear in foley. Weight rising.   FiO2 0.4 on vent. Vt 465 cc.   Patient wakes up with sedation wean, agitated.   TFs ongoing.   ECMO: 3300 rpm Flow 4.38 Pvenous -76 DeltaP 25 Sweep 4 ABG 7.35/56/71/92% Current sat 93% PTT 61 (goal 60-80), on bivalirudin Lactate 1.6 => 0.8 => 0.8 => 0.7 LDH 323 => 356 => 310 => 278  Na 157, on free H20 boluses 250 q8hrs  CXR: bibasilar infiltrates, stable cannula position   Objective:   Weight Range: 114 kg Body mass index is 37.11 kg/m.   Vital Signs:   Temp:  [97.3 F (36.3 C)-98.9 F (37.2 C)] 98.9 F (37.2 C) (11/10 2000) Pulse Rate:  [98-109] 107 (11/11 0722) Resp:  [11-22] 20 (11/11 0722) BP: (108-119)/(58-65) 108/58 (11/10 2327) SpO2:  [91 %-100 %] 94 % (11/11 0722) Arterial Line BP: (105-140)/(54-67) 108/55 (11/11 0300) FiO2 (%):  [40 %] 40 % (11/11 0722) Weight:  [638 kg] 114 kg (11/11 0222) Last BM Date: 06/20/21  Weight change: Filed Weights   06/19/21 0500 06/20/21 0500 06/21/21 0222  Weight: 106.9 kg 110.6 kg 114 kg    Intake/Output:   Intake/Output Summary (Last 24 hours) at 06/21/2021 0743 Last data filed at 06/21/2021 0700 Gross per 24 hour  Intake 4458.47 ml  Output 3865 ml  Net 593.47 ml      Physical Exam    General: Intubated/sedated.  Neck: Crescent right neck and LIJ CVL, no thyromegaly or thyroid nodule.  Lungs: Clear to auscultation bilaterally with normal  respiratory effort. CV: Nondisplaced PMI.  Heart irregular S1/S2, no S3/S4, no murmur.  No peripheral edema.   Abdomen: Soft, nontender, no hepatosplenomegaly, no distention.  Skin: Intact without lesions or rashes.  Neurologic: Sedated Extremities: No clubbing or cyanosis.  HEENT: Normal.    Telemetry   Atrial fibrillation 90s-100s (personally reviewed)  Labs    CBC Recent Labs    06/20/21 1700 06/20/21 1725 06/21/21 0408 06/21/21 0411  WBC 9.6  --   --  8.8  HGB 8.4*   < > 7.5* 8.6*  HCT 29.0*   < > 22.0* 30.5*  MCV 99.3  --   --  100.0  PLT 138*  --   --  125*   < > = values in this interval not displayed.   Basic Metabolic Panel Recent Labs    06/20/21 1700 06/20/21 1725 06/21/21 0408 06/21/21 0411  NA 152*   < > 158* 157*  K 5.6*   < > 4.9 5.1  CL 117*  --   --  122*  CO2 30  --   --  30  GLUCOSE 253*  --   --  154*  BUN 83*  --   --  75*  CREATININE 1.31*  --   --  1.27*  CALCIUM 8.6*  --   --  8.9   < > =  values in this interval not displayed.   Liver Function Tests Recent Labs    06/20/21 0303 06/21/21 0411  AST 79* 66*  ALT 36 42  ALKPHOS 42 38  BILITOT 1.0 0.8  PROT 6.0* 6.0*  ALBUMIN 1.9* 1.9*   No results for input(s): LIPASE, AMYLASE in the last 72 hours. Cardiac Enzymes No results for input(s): CKTOTAL, CKMB, CKMBINDEX, TROPONINI in the last 72 hours.  BNP: BNP (last 3 results) Recent Labs    10/29/20 1214  BNP 102.9*    ProBNP (last 3 results) No results for input(s): PROBNP in the last 8760 hours.   D-Dimer No results for input(s): DDIMER in the last 72 hours. Hemoglobin A1C No results for input(s): HGBA1C in the last 72 hours. Fasting Lipid Panel Recent Labs    06/19/21 0312  TRIG 188*   Thyroid Function Tests No results for input(s): TSH, T4TOTAL, T3FREE, THYROIDAB in the last 72 hours.  Invalid input(s): FREET3  Other results:   Imaging    DG CHEST PORT 1 VIEW  Result Date: 06/21/2021 CLINICAL DATA:   Tracheostomy EXAM: PORTABLE CHEST 1 VIEW COMPARISON:  Prior chest x-ray 06/20/2021 FINDINGS: Interval placement of tracheostomy tube. Tube tip is midline and at the level of the clavicles in good position. Feeding tube remains present. The tip lies off the field of view, presumably in the small bowel. Left IJ approach central venous catheter. Tip overlies the mid SVC. ECMO catheter via right internal jugular vein. Stable cardiomegaly. Probable left layering pleural effusion. Bibasilar atelectasis. No pneumothorax. No pulmonary edema. IMPRESSION: Interval removal of endotracheal tube and placement of a tracheostomy tube. Otherwise, stable chest x-ray. Electronically Signed   By: Jacqulynn Cadet M.D.   On: 06/21/2021 07:22     Medications:     Scheduled Medications:  budesonide (PULMICORT) nebulizer solution  0.25 mg Nebulization BID   chlorhexidine gluconate (MEDLINE KIT)  15 mL Mouth Rinse BID   Chlorhexidine Gluconate Cloth  6 each Topical Daily   clonazePAM  1 mg Per Tube BID   emtricitabine-tenofovir AF  1 tablet Per Tube Daily   And   dolutegravir  50 mg Per Tube Daily   feeding supplement (PROSource TF)  90 mL Per Tube TID   free water  200 mL Per Tube Q8H   insulin detemir  30 Units Subcutaneous BID   ipratropium-albuterol  3 mL Nebulization QID   mouth rinse  15 mL Mouth Rinse 10 times per day   methylPREDNISolone (SOLU-MEDROL) injection  40 mg Intravenous Q24H   metoprolol tartrate  12.5 mg Per Tube TID   mupirocin ointment  1 application Nasal BID   oxyCODONE  5 mg Per Tube Q6H   pantoprazole (PROTONIX) IV  40 mg Intravenous QHS   polyethylene glycol  17 g Per Tube BID   QUEtiapine  50 mg Per Tube BID   rosuvastatin  20 mg Per Tube Daily   senna-docusate  1 tablet Per Tube BID   sodium chloride flush  10-40 mL Intracatheter Q12H   sodium chloride flush  3 mL Intravenous Q12H    Infusions:  sodium chloride     sodium chloride 10 mL/hr at 07/04/2021 1502   sodium chloride      sodium chloride 20 mL/hr at 06/21/21 0700   sodium chloride     bivalirudin (ANGIOMAX) infusion 0.5 mg/mL (Non-ACS indications) 0.28 mg/kg/hr (06/21/21 0700)   ceFEPime (MAXIPIME) IV Stopped (06/21/21 0541)   dexmedetomidine (PRECEDEX) IV infusion 0.9 mcg/kg/hr (06/21/21 0713)  feeding supplement (VITAL 1.5 CAL) 1,000 mL (06/21/21 0555)   fentaNYL infusion INTRAVENOUS 200 mcg/hr (06/21/21 0700)   insulin 16 Units/hr (06/20/21 2223)   norepinephrine (LEVOPHED) Adult infusion Stopped (06/19/21 0952)    PRN Medications: sodium chloride, Place/Maintain arterial line **AND** sodium chloride, acetaminophen **OR** acetaminophen, albuterol, bisacodyl, dextrose, docusate, fentaNYL, ondansetron **OR** ondansetron (ZOFRAN) IV, sodium chloride flush, sodium chloride flush   Assessment/Plan   1. Acute hypoxemic and hypercarbic respiratory failure: In setting of COPD on home oxygen at baseline.  He has severe influenza infection.  Refractory hypoxemia and hypercarbia with maximal management.  He has underlying significant lung disease but has reasonable quality of life and had ARDS in the past requiring tracheostomy but was able to wean off.  RESP score was acceptable.  VV ECMO cannulation on 11/7.  Tracheostomy 11/10.  This morning, lactate is 0.7.  ABG 7.35/56/71/92% with sats currently 93%.  Respiratory acidosis resolved.  PaO2 remains low but sats consistently in 90s and improved PaCO2 (respiratory acidosis drove initiation of ECMO).  Sweep is at 4.  LDH ok.   - Continue current ECMO support, continue incremental/gradual sweep wean => wake up and drop sweep to 3.5.  - Bivalirudin for PTT 60-80.  - Give Lasix 20 mg IV x 1.  - Antibiotic coverage with cefepime - Solumedrol for severe COPD.  - Anticipate prolonged course.  2. Atrial fibrillation: He has been in atrial fibrillation on all ECGs since 2021, suspect chronic at this point. On Eliquis at home.  - Will manage on bivalirudin.  - Off  amiodarone, continue metoprolol 12.5 mg every 8 hrs.  - Consider cardioversion in future.  3.  AKI: Creatinine 1.6 => 2.37 => 1.85 => 1.63 => 1.27.  BUN is markedly high but coming down.  Urine looks clear, LDH not significantly elevated. K 5.1 today.  - Lasix 20 mg IV x 1 today.  4. Type 2 DM: SSI.  5. Influenza: Severe.  Completed oseltamivir.  6. COPD: Still smokes 1-2 cigs/day.  Intermittent home oxygen.  Followed by pulmonary, has been stable at home for years.  7. HIV: Last viral load was undetectable.  On treatment.  8. Shock: Suspect primarily septic shock.  Echo showed EF 55-60% with normal RV. Now off NE, resolved.  9. FEN: Tube feeds.  10. Heme: hgb 8.6 today, had 2 units PRBCs 11/9. 11. Hypernatremia: Increase free water to 250 cc q4 hrs.   CRITICAL CARE Performed by: Loralie Champagne  Total critical care time: 40 minutes  Critical care time was exclusive of separately billable procedures and treating other patients.  Critical care was necessary to treat or prevent imminent or life-threatening deterioration.  Critical care was time spent personally by me on the following activities: development of treatment plan with patient and/or surrogate as well as nursing, discussions with consultants, evaluation of patient's response to treatment, examination of patient, obtaining history from patient or surrogate, ordering and performing treatments and interventions, ordering and review of laboratory studies, ordering and review of radiographic studies, pulse oximetry and re-evaluation of patient's condition.     Length of Stay: 8  Loralie Champagne, MD  06/21/2021, 7:43 AM  Advanced Heart Failure Team Pager [phone] (M-F; 7a - 5p)  Please contact [facility] for night-coverage after hours (5p -7a ) and weekends on [email or web address]

## 2021-06-21 NOTE — Anesthesia Postprocedure Evaluation (Signed)
Anesthesia Post Note  Patient: Chris [name].  Procedure(s) Performed: ECMO CANNULATION     Patient location during evaluation: SICU Anesthesia Type: General Level of consciousness: sedated Pain management: pain level controlled Vital Signs Assessment: post-procedure vital signs reviewed and stable Respiratory status: patient remains intubated per anesthesia plan Cardiovascular status: ON ECMO. Postop Assessment: no apparent nausea or vomiting Anesthetic complications: no   No notable events documented.  Last Vitals:  Vitals:   06/21/21 0722 06/21/21 0800  BP:    Pulse: (!) 107   Resp: 20 19  Temp:  36.7 C  SpO2: 94% 93%    Last Pain:  Vitals:   06/21/21 0800  TempSrc: Core  PainSc:                  Chris [name]

## 2021-06-21 NOTE — Progress Notes (Signed)
NAME:  Chris [name]., MRN:  [id number], DOB:  [birth year], LOS: 8 ADMISSION DATE:  07/01/2021, CONSULTATION DATE:  06/15/2019 REFERRING MD:  Gaynell Face, CHIEF COMPLAINT:  hypercapnic respiratory failure   History of Present Illness:   Chris [name]. is an 55 y.o. male.  HPI: 55 year old man with a significant history of pre-existing Gold stage III severe COPD for over 10 years on intermittent home oxygen.  More dependent on oxygen recently but apparently good quality of life nonetheless.  Patient is as active as a breathing allows.  HIV disease with undetectable viral load, compliant with HART.  Last CD4 count 350.   Previously admitted 2019 with ARDS secondary to severe influenza.  Required tracheostomy at that time but was successfully decannulated and returned to baseline level of function.   Presented 11/3 with increasing shortness of breath and fevers.  Was initially started on BiPAP and found to be influenza A positive.  Eventually required intubation 11/3.   Consulted for ECMO 11/4 for refractory hypoxia and hypercapnia.  Pertinent  Medical History   Past Medical History:  Diagnosis Date   Anxiety    Atrial fibrillation (HCC)    CHF (congestive heart failure) (HCC)    COPD (chronic obstructive pulmonary disease) (HCC)    Emphysema [[name]]   Depression    Diabetes mellitus without complication (HCC)    type 2   Emphysema of lung (HCC)    GERD (gastroesophageal reflux disease)    HIV disease (HCC)    Hypertension    Oxygen deficiency     Significant Hospital Events: Including procedures, antibiotic start and stop dates in addition to other pertinent events   11/7- VV ECMO started 11/9 - weaning sedation. Transfused 2 units PRBC to keep O2 carrying capacity.  11/9 - remains agitated with wake up  11/10 - tracheostomy tube placed  Interim History / Subjective:   No issues overnight. On 4L of sweep.   Objective   Blood pressure (!) 108/58, pulse (!) 107, temperature  98.1 F (36.7 C), temperature source Core, resp. rate 19, height 5\' 9"  (1.753 m), weight 114 kg, SpO2 93 %.    Vent Mode: PCV FiO2 (%):  [40 %] 40 % Set Rate:  [18 bmp] 18 bmp PEEP:  [10 cmH20] 10 cmH20 Plateau Pressure:  [19 cmH20-25 cmH20] 25 cmH20   Intake/Output Summary (Last 24 hours) at 06/21/2021 0846 Last data filed at 06/21/2021 0800 Gross per 24 hour  Intake 4462.33 ml  Output 3865 ml  Net 597.33 ml   Filed Weights   06/19/21 0500 06/20/21 0500 06/21/21 0222  Weight: 106.9 kg 110.6 kg 114 kg    Examination: General appearance: 55 y.o., male, obese male  Eyes: tracking  HENT: NCAT, trach in place  Lungs: BL vented breaths  CV: RRR, S1, S2, no MRGs  Abdomen: Soft, non-tender; non-distended, BS present  Extremities: No peripheral edema, radial and DP pulses present bilaterally  Skin: Normal temperature, turgor and texture; no rash Neuro: Alert and oriented to person and place, no focal deficit    Ancillary tests:   ABG    Component Value Date/Time   PHART 7.364 06/21/2021 0803   PCO2ART 54.4 (H) 06/21/2021 0803   PO2ART 68 (L) 06/21/2021 0803   HCO3 31.1 (H) 06/21/2021 0803   TCO2 33 (H) 06/21/2021 0803   ACIDBASEDEF 3.2 (H) 12/14/2017 2135   O2SAT 93.0 06/21/2021 0803     Assessment & Plan:   Critically ill due to acute on  chronic hypoxic and hypercapnic respiratory failure from influenza pneumonia on background of severe COPD.  Unable to provide adequate gas exchange with lung protective ventilation and and spite of chemical paralysis. Critically ill due to rapid atrial fibrillation requiring titration of amiodarone Stage III COPD HIV disease well-controlled on ART Undiagnosed OSA. GERD History of diastolic heart failure Type 2 diabetes with uncontrolled hyperglycemia.  Hypernatremia from insensitive losses.   Plan:  Patient doing well post tracheostomy Given blood again today, flowing well on cardio help. Reviewed settings with multidisciplinary  team Continue enteral nutrition Beta-blockade for heart rate control Levophed for pressure support if needed Cefepime continued Bivalirudin for circuit anticoagulation No signs of bleeding continue to monitor Follow hemoglobin   Best Practice (right click and "Reselect all SmartList Selections" daily)   Diet/type: tubefeeds on hold for trach. DVT prophylaxis: bivalirudin - on hold for tracheostomy GI prophylaxis: PPI Lines: Central line and Arterial Line Foley:  Yes, and it is still needed Code Status:  full code Last date of multidisciplinary goals of care discussion: continue for family updates   Hopeful bridge to recovery.   Labs   CBC: Recent Labs  Lab 06/11/2021 0257 06/25/2021 0408 06/19/21 0312 06/19/21 0317 06/19/21 1541 06/19/21 1551 06/20/21 0303 06/20/21 0310 06/20/21 1700 06/20/21 1725 06/20/21 1824 06/20/21 1958 06/21/21 0408 06/21/21 0411 06/21/21 0803  WBC 7.9   < > 14.5*  --  13.5*  --  10.2  --  9.6  --   --   --   --  8.8  --   NEUTROABS 6.3  --   --   --   --   --   --   --   --   --   --   --   --   --   --   HGB 12.4*   < > 8.0*   < > 8.8*   < > 8.8*   < > 8.4*   < > 8.2* 8.2* 7.5* 8.6* 7.5*  HCT 43.3   < > 26.7*   < > 28.8*   < > 29.3*   < > 29.0*   < > 24.0* 24.0* 22.0* 30.5* 22.0*  MCV 100.5*   < > 97.8  --  95.7  --  97.7  --  99.3  --   --   --   --  100.0  --   PLT 157   < > 178  --  159  --  153  --  138*  --   --   --   --  125*  --    < > = values in this interval not displayed.    Basic Metabolic Panel: Recent Labs  Lab 06/16/21 0308 06/16/21 1100 06/16/21 2214 07/04/2021 0257 06/26/2021 1857 06/25/2021 1905 06/18/21 0329 06/18/21 0334 06/19/21 0312 06/19/21 0317 06/19/21 1541 06/19/21 1551 06/20/21 0303 06/20/21 0310 06/20/21 1700 06/20/21 1725 06/20/21 1824 06/20/21 1958 06/21/21 0408 06/21/21 0411 06/21/21 0803  NA 139   < >  --    < > 140   < > 140   < > 145   < > 146*   < > 149*   < > 152*   < > 154* 155* 158* 157*  158*  K 5.5*   < >  --    < > 5.5*   < > 5.1   < > 4.9   < > 5.6*   < > 5.4*   < > 5.6*   < >  5.4* 5.4* 4.9 5.1 4.8  CL 99   < >  --    < > 98  --  97*   < > 105  --  109  --  114*  --  117*  --   --   --   --  122*  --   CO2 35*   < >  --    < > 30  --  34*   < > 33*  --  31  --  30  --  30  --   --   --   --  30  --   GLUCOSE 236*   < >  --    < > 239*  --  345*   < > 215*  --  381*  --  155*  --  253*  --   --   --   --  154*  --   BUN 54*   < >  --    < > 86*  --  85*   < > 104*  --  97*  --  98*  --  83*  --   --   --   --  75*  --   CREATININE 1.68*   < >  --    < > 2.79*  --  2.37*   < > 1.85*  --  1.69*  --  1.63*  --  1.31*  --   --   --   --  1.27*  --   CALCIUM 9.0   < >  --    < > 8.0*  --  7.8*   < > 8.2*  --  8.3*  --  8.6*  --  8.6*  --   --   --   --  8.9  --   MG 2.9*  --  3.4*  --  2.9*  --  3.1*  --   --   --   --   --   --   --   --   --   --   --   --   --   --    < > = values in this interval not displayed.   GFR: Estimated Creatinine Clearance: 81.8 mL/min (A) (by C-G formula based on SCr of 1.27 mg/dL (H)). Recent Labs  Lab 06/19/21 0312 06/19/21 1541 06/20/21 0303 06/20/21 0509 06/20/21 1607 06/20/21 1700 06/21/21 0411  WBC 14.5* 13.5* 10.2  --   --  9.6 8.8  LATICACIDVEN 0.8  --   --  0.8 0.8  --  0.7    Liver Function Tests: Recent Labs  Lab 06/20/2021 1857 06/18/21 0329 06/19/21 0312 06/20/21 0303 06/21/21 0411  AST 62* 115* 112* 79* 66*  ALT 26 31 33 36 42  ALKPHOS 32* 33* 37* 42 38  BILITOT 2.6* 1.4* 1.2 1.0 0.8  PROT 5.7* 5.8* 5.9* 6.0* 6.0*  ALBUMIN 2.2* 2.1* 1.9* 1.9* 1.9*   No results for input(s): LIPASE, AMYLASE in the last 168 hours. No results for input(s): AMMONIA in the last 168 hours.  ABG    Component Value Date/Time   PHART 7.364 06/21/2021 0803   PCO2ART 54.4 (H) 06/21/2021 0803   PO2ART 68 (L) 06/21/2021 0803   HCO3 31.1 (H) 06/21/2021 0803   TCO2 33 (H) 06/21/2021 0803   ACIDBASEDEF 3.2 (H) 12/14/2017 2135   O2SAT 93.0  06/21/2021 0803      Coagulation Profile: Recent Labs  Lab 07/10/2021 1857 06/18/21 0329 06/19/21 0312 06/20/21 0303 06/21/21 0411  INR 1.7* 2.1* 1.7* 1.7* 1.9*    Cardiac Enzymes: No results for input(s): CKTOTAL, CKMB, CKMBINDEX, TROPONINI in the last 168 hours.  HbA1C: HbA1c, POC (controlled diabetic range)  Date/Time Value Ref Range Status  04/05/2021 02:09 PM 7.8 (A) 0.0 - 7.0 % Final  04/20/2020 11:13 AM 8.2 (A) 0.0 - 7.0 % Final   Hgb A1c MFr Bld  Date/Time Value Ref Range Status  06/11/2021 04:12 AM 6.8 (H) 4.8 - 5.6 % Final    Comment:    (NOTE) Pre diabetes:          5.7%-6.4%  Diabetes:              >6.4%  Glycemic control for   <7.0% adults with diabetes   10/30/2020 02:25 AM 7.3 (H) 4.8 - 5.6 % Final    Comment:    (NOTE) Pre diabetes:          5.7%-6.4%  Diabetes:              >6.4%  Glycemic control for   <7.0% adults with diabetes     CBG: Recent Labs  Lab 06/21/21 0203 06/21/21 0307 06/21/21 0405 06/21/21 0505 06/21/21 0604  GLUCAP 123* 137* 143* 165* 157*    This patient is critically ill with multiple organ system failure; which, requires frequent high complexity decision making, assessment, support, evaluation, and titration of therapies. This was completed through the application of advanced monitoring technologies and extensive interpretation of multiple databases. During this encounter critical care time was devoted to patient care services described in this note for 42 minutes.  Garner Nash, DO [facility] 06/21/2021 8:46 AM

## 2021-06-21 NOTE — Progress Notes (Signed)
ANTICOAGULATION CONSULT NOTE   Pharmacy Consult for Bivalirudin Indication:  ECMO and atrial fibrillation  Allergies  Allergen Reactions   Bactrim [Sulfamethoxazole-Trimethoprim] Hives    Patient Measurements: Height: 5\' 9"  (175.3 cm) Weight: 114 kg (251 lb 5.2 oz) IBW/kg (Calculated) : 70.7  Vital Signs: Temp: 98.2 F (36.8 C) (11/11 1600) Temp Source: Core (11/11 1600) BP: 121/62 (11/11 1224) Pulse Rate: 108 (11/11 1446)  Labs: Recent Labs    06/19/21 0312 06/19/21 0317 06/20/21 0303 06/20/21 0310 06/20/21 1700 06/20/21 1725 06/20/21 2002 06/21/21 0408 06/21/21 0411 06/21/21 0803 06/21/21 0904 06/21/21 0936 06/21/21 1603 06/21/21 1707 06/21/21 1805  HGB 8.0*   < > 8.8*   < > 8.4*   < >  --    < > 8.6*   < > 8.1*   < > 8.8* 8.2* 8.2*  HCT 26.7*   < > 29.3*   < > 29.0*   < >  --    < > 30.5*   < > 27.8*   < > 30.5* 24.0* 24.0*  PLT 178   < > 153  --  138*  --   --   --  125*  --  135*  --  140*  --   --   APTT 52*   < >  --    < >  --   --  60*  --  61*  --   --   --  66*  --   --   LABPROT 19.8*  --  20.3*  --   --   --   --   --  21.7*  --   --   --   --   --   --   INR 1.7*  --  1.7*  --   --   --   --   --  1.9*  --   --   --   --   --   --   CREATININE 1.85*   < > 1.63*  --  1.31*  --   --   --  1.27*  --   --   --  1.34*  --   --    < > = values in this interval not displayed.     Estimated Creatinine Clearance: 77.5 mL/min (A) (by C-G formula based on SCr of 1.34 mg/dL (H)).   Medical History: Past Medical History:  Diagnosis Date   Anxiety    Atrial fibrillation (HCC)    CHF (congestive heart failure) (HCC)    COPD (chronic obstructive pulmonary disease) (HCC)    Emphysema [[name]]   Depression    Diabetes mellitus without complication (HCC)    type 2   Emphysema of lung (HCC)    GERD (gastroesophageal reflux disease)    HIV disease (HCC)    Hypertension    Oxygen deficiency     Assessment: 55 Y/O male on chronic apixaban for atrial  fibrillation. Previous placed on heparin in anticipation of line placement and possible procedures.  Last apixaban dose given @0948  11/7. Patient cannulated and ECMO initiated 11/7; received a heparin bolus at cannulation, then switched to bivalrudin infusion. Pharmacy to dose bivalirudin.  aPTT 66sec at goal on bivalrudin 0.28mg /kg/hr. CBC stable - hgb 8's, plt but stable, LDH stable dropping 300>200  S/p trach placed 11/10.  No pump issues  Goal of Therapy:  aPTT 60-80 secs Monitor platelets by anticoagulation protocol: Yes   Plan:  Continue bivalirudin to 0.28  mg/kg/hr  Aptt and CBC q12hr     Leota Sauers Pharm.D. CPP, BCPS Clinical Pharmacist [phone] 06/21/2021 7:37 PM    Please check [email or web address] for unit-specific pharmacy phone numbers.

## 2021-06-21 NOTE — Progress Notes (Signed)
OT Cancellation Note  Patient Details Name: Chris [name]. MRN: [id number] DOB: [birth year]   Cancelled Treatment:    Reason Eval/Treat Not Completed: Patient not medically ready. RN asking to defer, pt with agitation when sedation reduced. Will continue to follow and initiate OT when appropriate.    [name] 06/21/2021, 8:16 AM [name], OTR/L Acute Rehabilitation Services Pager: [phone] Office: [phone]

## 2021-06-22 ENCOUNTER — Inpatient Hospital Stay (HOSPITAL_COMMUNITY): Payer: Medicare HMO

## 2021-06-22 DIAGNOSIS — J9622 Acute and chronic respiratory failure with hypercapnia: Secondary | ICD-10-CM | POA: Diagnosis not present

## 2021-06-22 DIAGNOSIS — J101 Influenza due to other identified influenza virus with other respiratory manifestations: Secondary | ICD-10-CM | POA: Diagnosis not present

## 2021-06-22 DIAGNOSIS — J9621 Acute and chronic respiratory failure with hypoxia: Secondary | ICD-10-CM | POA: Diagnosis not present

## 2021-06-22 LAB — POCT I-STAT 7, (LYTES, BLD GAS, ICA,H+H)
Acid-Base Excess: 5 mmol/L — ABNORMAL HIGH (ref 0.0–2.0)
Acid-Base Excess: 3 mmol/L — ABNORMAL HIGH (ref 0.0–2.0)
Acid-Base Excess: 2 mmol/L (ref 0.0–2.0)
Acid-Base Excess: 3 mmol/L — ABNORMAL HIGH (ref 0.0–2.0)
Acid-Base Excess: 2 mmol/L (ref 0.0–2.0)
Acid-Base Excess: 2 mmol/L (ref 0.0–2.0)
Bicarbonate: 30.9 mmol/L — ABNORMAL HIGH (ref 20.0–28.0)
Bicarbonate: 29.5 mmol/L — ABNORMAL HIGH (ref 20.0–28.0)
Bicarbonate: 28.9 mmol/L — ABNORMAL HIGH (ref 20.0–28.0)
Bicarbonate: 29 mmol/L — ABNORMAL HIGH (ref 20.0–28.0)
Bicarbonate: 28.5 mmol/L — ABNORMAL HIGH (ref 20.0–28.0)
Bicarbonate: 28.6 mmol/L — ABNORMAL HIGH (ref 20.0–28.0)
Calcium, Ion: 1.34 mmol/L (ref 1.15–1.40)
Calcium, Ion: 1.36 mmol/L (ref 1.15–1.40)
Calcium, Ion: 1.34 mmol/L (ref 1.15–1.40)
Calcium, Ion: 1.33 mmol/L (ref 1.15–1.40)
Calcium, Ion: 1.34 mmol/L (ref 1.15–1.40)
Calcium, Ion: 1.33 mmol/L (ref 1.15–1.40)
HCT: 23 % — ABNORMAL LOW (ref 39.0–52.0)
HCT: 26 % — ABNORMAL LOW (ref 39.0–52.0)
HCT: 27 % — ABNORMAL LOW (ref 39.0–52.0)
HCT: 26 % — ABNORMAL LOW (ref 39.0–52.0)
HCT: 28 % — ABNORMAL LOW (ref 39.0–52.0)
HCT: 25 % — ABNORMAL LOW (ref 39.0–52.0)
Hemoglobin: 7.8 g/dL — ABNORMAL LOW (ref 13.0–17.0)
Hemoglobin: 8.8 g/dL — ABNORMAL LOW (ref 13.0–17.0)
Hemoglobin: 9.2 g/dL — ABNORMAL LOW (ref 13.0–17.0)
Hemoglobin: 8.8 g/dL — ABNORMAL LOW (ref 13.0–17.0)
Hemoglobin: 9.5 g/dL — ABNORMAL LOW (ref 13.0–17.0)
Hemoglobin: 8.5 g/dL — ABNORMAL LOW (ref 13.0–17.0)
O2 Saturation: 95 %
O2 Saturation: 94 %
O2 Saturation: 88 %
O2 Saturation: 90 %
O2 Saturation: 92 %
O2 Saturation: 93 %
Patient temperature: 98.2
Patient temperature: 36.8
Patient temperature: 36.6
Patient temperature: 36.8
Patient temperature: 36.8
Patient temperature: 99
Potassium: 5 mmol/L (ref 3.5–5.1)
Potassium: 4.7 mmol/L (ref 3.5–5.1)
Potassium: 5 mmol/L (ref 3.5–5.1)
Potassium: 5.3 mmol/L — ABNORMAL HIGH (ref 3.5–5.1)
Potassium: 5.5 mmol/L — ABNORMAL HIGH (ref 3.5–5.1)
Potassium: 5.1 mmol/L (ref 3.5–5.1)
Sodium: 156 mmol/L — ABNORMAL HIGH (ref 135–145)
Sodium: 157 mmol/L — ABNORMAL HIGH (ref 135–145)
Sodium: 155 mmol/L — ABNORMAL HIGH (ref 135–145)
Sodium: 154 mmol/L — ABNORMAL HIGH (ref 135–145)
Sodium: 153 mmol/L — ABNORMAL HIGH (ref 135–145)
Sodium: 154 mmol/L — ABNORMAL HIGH (ref 135–145)
TCO2: 33 mmol/L — ABNORMAL HIGH (ref 22–32)
TCO2: 31 mmol/L (ref 22–32)
TCO2: 31 mmol/L (ref 22–32)
TCO2: 31 mmol/L (ref 22–32)
TCO2: 30 mmol/L (ref 22–32)
TCO2: 30 mmol/L (ref 22–32)
pCO2 arterial: 55.2 mmHg — ABNORMAL HIGH (ref 32.0–48.0)
pCO2 arterial: 56.7 mmHg — ABNORMAL HIGH (ref 32.0–48.0)
pCO2 arterial: 54.2 mmHg — ABNORMAL HIGH (ref 32.0–48.0)
pCO2 arterial: 52.4 mmHg — ABNORMAL HIGH (ref 32.0–48.0)
pCO2 arterial: 51.2 mmHg — ABNORMAL HIGH (ref 32.0–48.0)
pCO2 arterial: 53.8 mmHg — ABNORMAL HIGH (ref 32.0–48.0)
pH, Arterial: 7.354 (ref 7.350–7.450)
pH, Arterial: 7.324 — ABNORMAL LOW (ref 7.350–7.450)
pH, Arterial: 7.333 — ABNORMAL LOW (ref 7.350–7.450)
pH, Arterial: 7.351 (ref 7.350–7.450)
pH, Arterial: 7.352 (ref 7.350–7.450)
pH, Arterial: 7.334 — ABNORMAL LOW (ref 7.350–7.450)
pO2, Arterial: 77 mmHg — ABNORMAL LOW (ref 83.0–108.0)
pO2, Arterial: 78 mmHg — ABNORMAL LOW (ref 83.0–108.0)
pO2, Arterial: 59 mmHg — ABNORMAL LOW (ref 83.0–108.0)
pO2, Arterial: 63 mmHg — ABNORMAL LOW (ref 83.0–108.0)
pO2, Arterial: 68 mmHg — ABNORMAL LOW (ref 83.0–108.0)
pO2, Arterial: 74 mmHg — ABNORMAL LOW (ref 83.0–108.0)

## 2021-06-22 LAB — GLUCOSE, CAPILLARY
Glucose-Capillary: 123 mg/dL — ABNORMAL HIGH (ref 70–99)
Glucose-Capillary: 126 mg/dL — ABNORMAL HIGH (ref 70–99)
Glucose-Capillary: 130 mg/dL — ABNORMAL HIGH (ref 70–99)
Glucose-Capillary: 140 mg/dL — ABNORMAL HIGH (ref 70–99)
Glucose-Capillary: 141 mg/dL — ABNORMAL HIGH (ref 70–99)
Glucose-Capillary: 142 mg/dL — ABNORMAL HIGH (ref 70–99)
Glucose-Capillary: 142 mg/dL — ABNORMAL HIGH (ref 70–99)
Glucose-Capillary: 158 mg/dL — ABNORMAL HIGH (ref 70–99)
Glucose-Capillary: 164 mg/dL — ABNORMAL HIGH (ref 70–99)
Glucose-Capillary: 166 mg/dL — ABNORMAL HIGH (ref 70–99)
Glucose-Capillary: 175 mg/dL — ABNORMAL HIGH (ref 70–99)
Glucose-Capillary: 175 mg/dL — ABNORMAL HIGH (ref 70–99)
Glucose-Capillary: 183 mg/dL — ABNORMAL HIGH (ref 70–99)
Glucose-Capillary: 186 mg/dL — ABNORMAL HIGH (ref 70–99)
Glucose-Capillary: 189 mg/dL — ABNORMAL HIGH (ref 70–99)
Glucose-Capillary: 202 mg/dL — ABNORMAL HIGH (ref 70–99)

## 2021-06-22 LAB — BASIC METABOLIC PANEL
Anion gap: 5 (ref 5–15)
Anion gap: 6 (ref 5–15)
BUN: 66 mg/dL — ABNORMAL HIGH (ref 6–20)
BUN: 67 mg/dL — ABNORMAL HIGH (ref 6–20)
CO2: 28 mmol/L (ref 22–32)
CO2: 26 mmol/L (ref 22–32)
Calcium: 8.9 mg/dL (ref 8.9–10.3)
Calcium: 9 mg/dL (ref 8.9–10.3)
Chloride: 116 mmol/L — ABNORMAL HIGH (ref 98–111)
Chloride: 120 mmol/L — ABNORMAL HIGH (ref 98–111)
Creatinine, Ser: 1.31 mg/dL — ABNORMAL HIGH (ref 0.61–1.24)
Creatinine, Ser: 1.43 mg/dL — ABNORMAL HIGH (ref 0.61–1.24)
GFR, Estimated: >60 mL/min (ref >60)
GFR, Estimated: 58 mL/min — ABNORMAL LOW (ref >60)
Glucose, Bld: 145 mg/dL — ABNORMAL HIGH (ref 70–99)
Glucose, Bld: 225 mg/dL — ABNORMAL HIGH (ref 70–99)
Potassium: 4.9 mmol/L (ref 3.5–5.1)
Potassium: 5.6 mmol/L — ABNORMAL HIGH (ref 3.5–5.1)
Sodium: 149 mmol/L — ABNORMAL HIGH (ref 135–145)
Sodium: 152 mmol/L — ABNORMAL HIGH (ref 135–145)

## 2021-06-22 LAB — HEPATIC FUNCTION PANEL
ALT: 56 U/L — ABNORMAL HIGH (ref 0–44)
AST: 62 U/L — ABNORMAL HIGH (ref 15–41)
Albumin: 1.8 g/dL — ABNORMAL LOW (ref 3.5–5.0)
Alkaline Phosphatase: 41 U/L (ref 38–126)
Bilirubin, Direct: 0.1 mg/dL (ref 0.0–0.2)
Indirect Bilirubin: 0.5 mg/dL (ref 0.3–0.9)
Total Bilirubin: 0.6 mg/dL (ref 0.3–1.2)
Total Protein: 6.2 g/dL — ABNORMAL LOW (ref 6.5–8.1)

## 2021-06-22 LAB — CBC
HCT: 28.2 % — ABNORMAL LOW (ref 39.0–52.0)
HCT: 29.1 % — ABNORMAL LOW (ref 39.0–52.0)
Hemoglobin: 8.4 g/dL — ABNORMAL LOW (ref 13.0–17.0)
Hemoglobin: 8.4 g/dL — ABNORMAL LOW (ref 13.0–17.0)
MCH: 29.6 pg (ref 26.0–34.0)
MCH: 29 pg (ref 26.0–34.0)
MCHC: 29.8 g/dL — ABNORMAL LOW (ref 30.0–36.0)
MCHC: 28.9 g/dL — ABNORMAL LOW (ref 30.0–36.0)
MCV: 99.3 fL (ref 80.0–100.0)
MCV: 100.3 fL — ABNORMAL HIGH (ref 80.0–100.0)
Platelets: 136 10*3/uL — ABNORMAL LOW (ref 150–400)
Platelets: 147 10*3/uL — ABNORMAL LOW (ref 150–400)
RBC: 2.84 MIL/uL — ABNORMAL LOW (ref 4.22–5.81)
RBC: 2.9 MIL/uL — ABNORMAL LOW (ref 4.22–5.81)
RDW: 15.9 % — ABNORMAL HIGH (ref 11.5–15.5)
RDW: 15.3 % (ref 11.5–15.5)
WBC: 12.6 10*3/uL — ABNORMAL HIGH (ref 4.0–10.5)
WBC: 14.9 10*3/uL — ABNORMAL HIGH (ref 4.0–10.5)
nRBC: 0.9 % — ABNORMAL HIGH (ref 0.0–0.2)
nRBC: 0.7 % — ABNORMAL HIGH (ref 0.0–0.2)

## 2021-06-22 LAB — PROTIME-INR
INR: 2 — ABNORMAL HIGH (ref 0.8–1.2)
Prothrombin Time: 22.4 seconds — ABNORMAL HIGH (ref 11.4–15.2)

## 2021-06-22 LAB — LACTIC ACID, PLASMA
Lactic Acid, Venous: 0.6 mmol/L (ref 0.5–1.9)
Lactic Acid, Venous: 0.9 mmol/L (ref 0.5–1.9)

## 2021-06-22 LAB — LACTATE DEHYDROGENASE: LDH: 262 U/L — ABNORMAL HIGH (ref 98–192)

## 2021-06-22 LAB — FIBRINOGEN: Fibrinogen: >800 mg/dL — ABNORMAL HIGH (ref 210–475)

## 2021-06-22 LAB — APTT
aPTT: 62 seconds — ABNORMAL HIGH (ref 24–36)
aPTT: 65 seconds — ABNORMAL HIGH (ref 24–36)

## 2021-06-22 MED ORDER — METOPROLOL TARTRATE 25 MG/10 ML ORAL SUSPENSION
25.0000 mg | Freq: Three times a day (TID) | ORAL | Status: DC
  Administered 2021-06-22 (×3): 25 mg
  Filled 2021-06-22 (×4): qty 10

## 2021-06-22 MED ORDER — ALBUMIN HUMAN 5 % IV SOLN
INTRAVENOUS | Status: AC
  Filled 2021-06-22: qty 250

## 2021-06-22 MED ORDER — FUROSEMIDE 10 MG/ML IJ SOLN
40.0000 mg | Freq: Two times a day (BID) | INTRAMUSCULAR | Status: AC
  Administered 2021-06-22 (×2): 40 mg via INTRAVENOUS
  Filled 2021-06-22 (×2): qty 4

## 2021-06-22 MED ORDER — OXYCODONE HCL 5 MG PO TABS
10.0000 mg | ORAL_TABLET | Freq: Four times a day (QID) | ORAL | Status: DC
  Administered 2021-06-22 – 2021-06-25 (×12): 10 mg
  Filled 2021-06-22 (×13): qty 2

## 2021-06-22 MED ORDER — FUROSEMIDE 10 MG/ML IJ SOLN: 40.0000 mg | Freq: Two times a day (BID) | INTRAMUSCULAR | Status: DC

## 2021-06-22 NOTE — Evaluation (Signed)
Physical Therapy Evaluation Patient Details Name: Chris [name]. MRN: [id number] DOB: May 06, 1966 Today's Date: 06/22/2021  History of Present Illness  55 y.o. male admitted on 06/29/2021 for fever, SOB.  Found to have influenza A, A fib with RVR, acute on chronic respiratory failure with hypoxia (COPD exacerbation).  Pt intubated 11/3, self extubated 11/4, re-intubated 11/5, put on VV-ECMO 11/7, cortrak 11/9, s/p trach 11/10.  Pt with significant PMH of anxiety, A fib, CHF, COPD, DM, HIV, HTN, R knee fx surgery, exploratory laparotomy from [facility].  Clinical Impression  Pt tolerated tilt bed for ~20 mins with max angle of tilt 35 degrees.  VSS throughout on PRVC FiO2 40%, PEEP 10.  Pt remains in restraints, however, responds well to his wife and she helps keep him calm while we tilted.  RN and ECMO RN in room the entire session.  Pt tolerated it well.   PT to follow acutely for deficits listed below.        Recommendations for follow up therapy are one component of a multi-disciplinary discharge planning process, led by the attending physician.  Recommendations may be updated based on patient status, additional functional criteria and insurance authorization.  Follow Up Recommendations Acute inpatient rehab (3hours/day)    Assistance Recommended at Discharge Frequent or constant Supervision/Assistance  Functional Status Assessment Patient has had a recent decline in their functional status and demonstrates the ability to make significant improvements in function in a reasonable and predictable amount of time.  Equipment Recommendations  Hospital bed;Other (comment) (hoyer lift)    Recommendations for Other Services Rehab consult     Precautions / Restrictions Precautions Precautions: Other (comment) Precaution Comments: significant lines      Mobility  Bed Mobility Overal bed mobility: Needs Assistance             General bed mobility comments: Total assist to scott to the  [facility]. Angle: 35 degrees (15, 25, 35, 25, 15) Total Minutes in Angle: 10 minutes (~10 at each angle) Patient Response: Cooperative (wife at his side keeping him calm, at times lifting head and pulling against restraints, but not frequently.)  Transfers                        Ambulation/Gait                  Stairs            Wheelchair Mobility    Modified Rankin (Stroke Patients Only)       Balance                                             Pertinent Vitals/Pain Pain Assessment: Faces Faces Pain Scale: No hurt    Home Living Family/patient expects to be discharged to:: Private residence Living Arrangements: Spouse/significant other Available Help at Discharge: Family;Available PRN/intermittently Type of Home: Apartment Home Access: Stairs to enter       Home Layout: One level   Additional Comments: home O2 at 2L    Prior Function               Mobility Comments: likes the [employer] football team, music, wife to bring pictures on a poster to hang in room with names of relative and relation to him on Monday 06/24/21       Hand Dominance  Extremity/Trunk Assessment   Upper Extremity Assessment Upper Extremity Assessment: Overall WFL for tasks assessed (pulls strongly against the restraints)    Lower Extremity Assessment Lower Extremity Assessment: Generalized weakness    Cervical / Trunk Assessment Cervical / Trunk Assessment: Normal  Communication   Communication: Tracheostomy  Cognition Arousal/Alertness: Awake/alert;Lethargic Behavior During Therapy: Anxious;Impulsive Overall Cognitive Status: Impaired/Different from baseline Area of Impairment: Awareness                           Awareness: Intellectual   General Comments: Pt responds briskly to wife, more so than RN staff and PT.  He is in and out of alertness and lethargy and restrained bil hands as when sedation is lightened he  strats to pull at lines (already self extubated x 1).  Wife seems to calm him.        General Comments General comments (skin integrity, edema, etc.): VSS throughout on PRVC FiO2 40%, PEEP 10, HR 130s max observed, O2 sats in the 90s, pressures dropped a little in full tilt, but pt still responsive, so continued.    Exercises     Assessment/Plan    PT Assessment Patient needs continued PT services  PT Problem List Decreased strength;Decreased activity tolerance;Decreased balance;Decreased mobility;Decreased cognition;Decreased knowledge of use of DME;Decreased safety awareness;Cardiopulmonary status limiting activity       PT Treatment Interventions DME instruction;Gait training;Stair training;Functional mobility training;Therapeutic activities;Therapeutic exercise;Balance training;Patient/family education;Cognitive remediation;Neuromuscular re-education    PT Goals (Current goals can be found in the Care Plan section)  Acute Rehab PT Goals Patient Stated Goal: wife just wants him to get better PT Goal Formulation: With family Time For Goal Achievement: 07/06/21 Potential to Achieve Goals: Good    Frequency Min 3X/week   Barriers to discharge        Co-evaluation               AM-PAC PT "6 Clicks" Mobility  Outcome Measure Help needed turning from your back to your side while in a flat bed without using bedrails?: Total Help needed moving from lying on your back to sitting on the side of a flat bed without using bedrails?: Total Help needed moving to and from a bed to a chair (including a wheelchair)?: Total Help needed standing up from a chair using your arms (e.g., wheelchair or bedside chair)?: Total Help needed to walk in hospital room?: Total Help needed climbing 3-5 steps with a railing? : Total 6 Click Score: 6    End of Session Equipment Utilized During Treatment: Oxygen Activity Tolerance: Patient tolerated treatment well Patient left: in bed;with call  bell/phone within reach;with nursing/sitter in room;with restraints reapplied;with family/visitor present;with SCD's reapplied Nurse Communication: Other (comment) (RN helping with tilt, no communication needed.) PT Visit Diagnosis: Muscle weakness (generalized) (M62.81);Difficulty in walking, not elsewhere classified (R26.2)    Time: [id number] PT Time Calculation (min) (ACUTE ONLY): 31 min   Charges:   PT Evaluation $PT Eval Moderate Complexity: 1 Mod PT Treatments $Therapeutic Activity: 8-22 mins        Verdene Lennert, PT, DPT  Acute Rehabilitation Ortho Tech Supervisor [phone] pager [phone]) [phone] office

## 2021-06-22 NOTE — Progress Notes (Signed)
Patient ID: Chris Rivers., male   DOB: [birth year], 55 y.o.   MRN: [id number]  Extracorporeal support note     ECLS support day: 5 Indication: Severe COPD with influenza A   Configuration: VV ECMO, Crescent catheter right IJ   Drainage cannula: Right IJ Crescent Return cannula: Right IJ Crescent   Pump speed: 3500 Pump flow: 4.65 Pump used: Cardiohelp   Sweep gas: 4   Circuit check: Small area of thrombus 3 o'clock  Anticoagulant: bivalirudin Anticoagulation target: PTT 60-80   Changes in support: Try to drop sweep incrementally.    Anticipated goals/duration of support: wean to decannulation  Chris [name] 06/22/2021 8:05 AM

## 2021-06-22 NOTE — Progress Notes (Signed)
ANTICOAGULATION CONSULT NOTE   Pharmacy Consult for Bivalirudin Indication:  ECMO and atrial fibrillation  Allergies  Allergen Reactions   Bactrim [Sulfamethoxazole-Trimethoprim] Hives    Patient Measurements: Height: 5\' 9"  (175.3 cm) Weight: 116 kg (255 lb 11.7 oz) IBW/kg (Calculated) : 70.7  Vital Signs: Temp: 98 F (36.7 C) (11/12 0700) Temp Source: Axillary (11/12 0700) BP: 125/57 (11/12 1605) Pulse Rate: 121 (11/12 1605)  Labs: Recent Labs    06/20/21 0303 06/20/21 0310 06/21/21 0411 06/21/21 0803 06/21/21 1603 06/21/21 1707 06/22/21 0357 06/22/21 0808 06/22/21 1329 06/22/21 1605 06/22/21 1631 06/22/21 1710  HGB 8.8*   < > 8.6*   < > 8.8*   < > 8.4*  7.8*   < > 8.8* 9.5* 8.4*  --   HCT 29.3*   < > 30.5*   < > 30.5*   < > 28.2*  23.0*   < > 26.0* 28.0* 29.1*  --   PLT 153   < > 125*   < > 140*  --  136*  --   --   --  147*  --   APTT  --    < > 61*  --  66*  --  62*  --   --   --   --  65*  LABPROT 20.3*  --  21.7*  --   --   --  22.4*  --   --   --   --   --   INR 1.7*  --  1.9*  --   --   --  2.0*  --   --   --   --   --   CREATININE 1.63*   < > 1.27*  --  1.34*  --  1.31*  --   --   --   --   --    < > = values in this interval not displayed.     Estimated Creatinine Clearance: 80 mL/min (A) (by C-G formula based on SCr of 1.31 mg/dL (H)).   Medical History: Past Medical History:  Diagnosis Date   Anxiety    Atrial fibrillation (HCC)    CHF (congestive heart failure) (HCC)    COPD (chronic obstructive pulmonary disease) (HCC)    Emphysema [[name]]   Depression    Diabetes mellitus without complication (HCC)    type 2   Emphysema of lung (HCC)    GERD (gastroesophageal reflux disease)    HIV disease (HCC)    Hypertension    Oxygen deficiency     Assessment: 55 Y/O male on chronic apixaban for atrial fibrillation. Previous placed on heparin in anticipation of line placement and possible procedures.  Last apixaban dose given @0948  11/7.  Patient cannulated and ECMO initiated 11/7; received a heparin bolus at cannulation, then switched to bivalrudin infusion. Pharmacy to dose bivalirudin.  aPTT 65 sec at goal on bivalrudin 0.28mg /kg/hr. CBC stable - hgb 8's, plt stable, LDH stable dropping 300>200s  S/p trach placed 11/10.  No pump issues.  Goal of Therapy:  aPTT 60-80 secs Monitor platelets by anticoagulation protocol: Yes   Plan:  Continue bivalirudin to 0.28 mg/kg/hr  Aptt and CBC q12hr   13/7 PharmD., BCPS Clinical Pharmacist 06/22/2021 5:59 PM  [facility] pharmacy phone numbers are listed on [email or web address]

## 2021-06-22 NOTE — Plan of Care (Signed)
  Problem: Education: Goal: Knowledge of General Education information will improve Description: Including pain rating scale, medication(s)/side effects and non-pharmacologic comfort measures Outcome: Progressing   Problem: Clinical Measurements: Goal: Ability to maintain clinical measurements within normal limits will improve Outcome: Progressing Goal: Will remain free from infection Outcome: Progressing   Problem: Skin Integrity: Goal: Risk for impaired skin integrity will decrease Outcome: Progressing   Problem: Safety: Goal: Non-violent Restraint(s) Outcome: Not Progressing

## 2021-06-22 NOTE — Progress Notes (Signed)
ANTICOAGULATION CONSULT NOTE   Pharmacy Consult for Bivalirudin Indication:  ECMO and atrial fibrillation  Allergies  Allergen Reactions   Bactrim [Sulfamethoxazole-Trimethoprim] Hives    Patient Measurements: Height: 5\' 9"  (175.3 cm) Weight: 116 kg (255 lb 11.7 oz) IBW/kg (Calculated) : 70.7  Vital Signs: Temp: 98 F (36.7 C) (11/12 0700) Temp Source: Axillary (11/12 0700) BP: 93/51 (11/12 1056) Pulse Rate: 102 (11/12 1056)  Labs: Recent Labs    06/20/21 0303 06/20/21 0310 06/21/21 0411 06/21/21 0803 06/21/21 0904 06/21/21 0936 06/21/21 1603 06/21/21 1707 06/22/21 0357 06/22/21 0808 06/22/21 1228 06/22/21 1329  HGB 8.8*   < > 8.6*   < > 8.1*   < > 8.8*   < > 8.4*  7.8* 8.8* 9.2* 8.8*  HCT 29.3*   < > 30.5*   < > 27.8*   < > 30.5*   < > 28.2*  23.0* 26.0* 27.0* 26.0*  PLT 153   < > 125*  --  135*  --  140*  --  136*  --   --   --   APTT  --    < > 61*  --   --   --  66*  --  62*  --   --   --   LABPROT 20.3*  --  21.7*  --   --   --   --   --  22.4*  --   --   --   INR 1.7*  --  1.9*  --   --   --   --   --  2.0*  --   --   --   CREATININE 1.63*   < > 1.27*  --   --   --  1.34*  --  1.31*  --   --   --    < > = values in this interval not displayed.     Estimated Creatinine Clearance: 80 mL/min (A) (by C-G formula based on SCr of 1.31 mg/dL (H)).   Medical History: Past Medical History:  Diagnosis Date   Anxiety    Atrial fibrillation (HCC)    CHF (congestive heart failure) (HCC)    COPD (chronic obstructive pulmonary disease) (HCC)    Emphysema [[name]]   Depression    Diabetes mellitus without complication (HCC)    type 2   Emphysema of lung (HCC)    GERD (gastroesophageal reflux disease)    HIV disease (HCC)    Hypertension    Oxygen deficiency     Assessment: 55 Y/O male on chronic apixaban for atrial fibrillation. Previous placed on heparin in anticipation of line placement and possible procedures.  Last apixaban dose given @0948  11/7.  Patient cannulated and ECMO initiated 11/7; received a heparin bolus at cannulation, then switched to bivalrudin infusion. Pharmacy to dose bivalirudin.  aPTT 62 sec at goal on bivalrudin 0.28mg /kg/hr. CBC stable - hgb 8's, plt but stable, LDH stable dropping 300>200s  S/p trach placed 11/10.  No pump issues  Goal of Therapy:  aPTT 60-80 secs Monitor platelets by anticoagulation protocol: Yes   Plan:  Continue bivalirudin to 0.28 mg/kg/hr  Aptt and CBC q12hr   13/7, 13/10, BCPS, [facility] Clinical Pharmacist  06/22/2021 1:58 PM   [facility] pharmacy phone numbers are listed on [email or web address]

## 2021-06-22 NOTE — Plan of Care (Signed)
  Problem: Education: Goal: Knowledge of General Education information will improve Description: Including pain rating scale, medication(s)/side effects and non-pharmacologic comfort measures Outcome: Progressing   Problem: Clinical Measurements: Goal: Ability to maintain clinical measurements within normal limits will improve Outcome: Progressing Goal: Will remain free from infection Outcome: Progressing Goal: Respiratory complications will improve Outcome: Progressing Goal: Cardiovascular complication will be avoided Outcome: Progressing   Problem: Skin Integrity: Goal: Risk for impaired skin integrity will decrease Outcome: Progressing

## 2021-06-22 NOTE — Progress Notes (Signed)
eLink Physician-Brief Progress Note Patient Name: Chris [name]. DOB: February 12, 1966 MRN: [id number]   Date of Service  06/22/2021  HPI/Events of Note  Patient needs restraints order renewed to prevent self-extubation.  eICU Interventions  Restraints renewed.        [name] 06/22/2021, 12:02 AM

## 2021-06-22 NOTE — Progress Notes (Signed)
Patient ID: Chris Round., male   DOB: 01/30/1966, 55 y.o.   MRN: [id number]     Advanced Heart Failure Rounding Note  PCP-Cardiologist: Glenetta Hew, MD   Subjective:    11/7: VV ECMO cannulation 11/10: Tracheostomy  This morning, patient is off NE with MAP 70s.  HR 100s-110s in atrial fibrillation, now on metoprolol 12.5 q8 hrs.   Afebrile, on cefepime.  Oseltamivir for influenza completed.  Solumedrol with severe COPD.   AKI with creatinine 2.37 => 1.85 => 1.63 => 1.27 => 1.31, BUN also trending down.  UOP 2767, urine appears clear in foley. Weight rising. I/Os positive.   FiO2 0.4 on vent. Sweep decreased to 3.5 yesterday but did not tolerate and increased again to 4.   Patient wakes up with sedation wean, agitated.   TFs ongoing.   ECMO: 3500 rpm Flow 4.65 Pvenous -86 DeltaP 28 Sweep 4 ABG 7.35/55/77/95% PTT 62 (goal 60-80), on bivalirudin Lactate 1.6 => 0.8 => 0.8 => 0.7 => 0.6 LDH 323 => 356 => 310 => 278 => 262  Na 149, on free H20 boluses 250 q8hrs  CXR: Left effusion + atelectasis, stable cannula position   Objective:   Weight Range: 116 kg Body mass index is 37.77 kg/m.   Vital Signs:   Temp:  [98 F (36.7 C)-98.5 F (36.9 C)] 98 F (36.7 C) (11/12 0700) Pulse Rate:  [81-108] 81 (11/12 0316) Resp:  [18-44] 21 (11/12 0500) BP: (104-121)/(55-62) 121/61 (11/12 0316) SpO2:  [90 %-97 %] 95 % (11/12 0500) Arterial Line BP: (103-207)/(35-80) 118/63 (11/12 0500) FiO2 (%):  [40 %] 40 % (11/12 0400) Weight:  [409 kg] 116 kg (11/12 0500) Last BM Date: 06/21/21  Weight change: Filed Weights   06/20/21 0500 06/21/21 0222 06/22/21 0500  Weight: 110.6 kg 114 kg 116 kg    Intake/Output:   Intake/Output Summary (Last 24 hours) at 06/22/2021 0755 Last data filed at 06/22/2021 0700 Gross per 24 hour  Intake 6417.28 ml  Output 3650 ml  Net 2767.28 ml      Physical Exam    General: Intubated/sedated Neck: Crescent catheter LIJ, CVL RIJ, no  thyromegaly or thyroid nodule.  Lungs: Decreased at bases CV: Nondisplaced PMI.  Heart irregular S1/S2, no S3/S4, no murmur.  No peripheral edema.   Abdomen: Soft, nontender, no hepatosplenomegaly, no distention.  Skin: Intact without lesions or rashes.  Neurologic: Alert and oriented x 3.  Psych: Normal affect. Extremities: No clubbing or cyanosis.  HEENT: Normal.    Telemetry   Atrial fibrillation 100s-110s (personally reviewed)  Labs    CBC Recent Labs    06/21/21 1603 06/21/21 1707 06/21/21 2212 06/22/21 0357  WBC 13.1*  --   --  12.6*  HGB 8.8*   < > 8.5* 8.4*  7.8*  HCT 30.5*   < > 25.0* 28.2*  23.0*  MCV 100.7*  --   --  99.3  PLT 140*  --   --  136*   < > = values in this interval not displayed.   Basic Metabolic Panel Recent Labs    06/21/21 1603 06/21/21 1707 06/21/21 2212 06/22/21 0357  NA 152*   < > 155* 149*  156*  K 5.6*   < > 5.4* 4.9  5.0  CL 120*  --   --  116*  CO2 29  --   --  28  GLUCOSE 191*  --   --  145*  BUN 74*  --   --  66*  CREATININE 1.34*  --   --  1.31*  CALCIUM 8.9  --   --  8.9   < > = values in this interval not displayed.   Liver Function Tests Recent Labs    06/21/21 0411 06/22/21 0357  AST 66* 62*  ALT 42 56*  ALKPHOS 38 41  BILITOT 0.8 0.6  PROT 6.0* 6.2*  ALBUMIN 1.9* 1.8*   No results for input(s): LIPASE, AMYLASE in the last 72 hours. Cardiac Enzymes No results for input(s): CKTOTAL, CKMB, CKMBINDEX, TROPONINI in the last 72 hours.  BNP: BNP (last 3 results) Recent Labs    10/29/20 1214  BNP 102.9*    ProBNP (last 3 results) No results for input(s): PROBNP in the last 8760 hours.   D-Dimer No results for input(s): DDIMER in the last 72 hours. Hemoglobin A1C No results for input(s): HGBA1C in the last 72 hours. Fasting Lipid Panel No results for input(s): CHOL, HDL, LDLCALC, TRIG, CHOLHDL, LDLDIRECT in the last 72 hours.  Thyroid Function Tests No results for input(s): TSH, T4TOTAL, T3FREE,  THYROIDAB in the last 72 hours.  Invalid input(s): FREET3  Other results:   Imaging    No results found.   Medications:     Scheduled Medications:  budesonide (PULMICORT) nebulizer solution  0.25 mg Nebulization BID   chlorhexidine gluconate (MEDLINE KIT)  15 mL Mouth Rinse BID   Chlorhexidine Gluconate Cloth  6 each Topical Daily   clonazePAM  1 mg Per Tube BID   emtricitabine-tenofovir AF  1 tablet Per Tube Daily   And   dolutegravir  50 mg Per Tube Daily   feeding supplement (PROSource TF)  90 mL Per Tube TID   free water  250 mL Per Tube Q4H   furosemide  40 mg Intravenous BID   insulin detemir  30 Units Subcutaneous BID   ipratropium-albuterol  3 mL Nebulization QID   mouth rinse  15 mL Mouth Rinse 10 times per day   methylPREDNISolone (SOLU-MEDROL) injection  40 mg Intravenous Q24H   metoprolol tartrate  25 mg Per Tube TID   mupirocin ointment  1 application Nasal BID   oxyCODONE  10 mg Per Tube Q6H   pantoprazole (PROTONIX) IV  40 mg Intravenous QHS   polyethylene glycol  17 g Per Tube BID   QUEtiapine  50 mg Per Tube BID   rosuvastatin  20 mg Per Tube Daily   senna-docusate  1 tablet Per Tube BID   sodium chloride flush  10-40 mL Intracatheter Q12H   sodium chloride flush  3 mL Intravenous Q12H    Infusions:  sodium chloride     sodium chloride 10 mL/hr at 06/12/2021 1502   sodium chloride     sodium chloride 20 mL/hr at 06/22/21 0700   sodium chloride     bivalirudin (ANGIOMAX) infusion 0.5 mg/mL (Non-ACS indications) 0.28 mg/kg/hr (06/22/21 0700)   ceFEPime (MAXIPIME) IV Stopped (06/22/21 4098)   dexmedetomidine (PRECEDEX) IV infusion 0.7 mcg/kg/hr (06/22/21 0700)   feeding supplement (VITAL 1.5 CAL) 1,000 mL (06/22/21 0002)   fentaNYL infusion INTRAVENOUS 250 mcg/hr (06/22/21 0700)   insulin 3.6 Units/hr (06/22/21 0612)   norepinephrine (LEVOPHED) Adult infusion Stopped (06/19/21 0952)    PRN Medications: sodium chloride, Place/Maintain arterial  line **AND** sodium chloride, acetaminophen **OR** acetaminophen, albuterol, bisacodyl, dextrose, docusate, fentaNYL, ondansetron **OR** ondansetron (ZOFRAN) IV, sodium chloride flush, sodium chloride flush   Assessment/Plan   1. Acute hypoxemic and hypercarbic respiratory failure: In setting of COPD  on home oxygen at baseline.  He has severe influenza infection.  Refractory hypoxemia and hypercarbia with maximal management.  He has underlying significant lung disease but has reasonable quality of life and had ARDS in the past requiring tracheostomy but was able to wean off.  RESP score was acceptable.  VV ECMO cannulation on 11/7.  Tracheostomy 11/10.  This morning, lactate is 0.8.  ABG 7.35/55/77/95%.  Respiratory acidosis resolved. Sweep at 4, unable to wean yesterday.  LDH ok.  Weight up with I/Os positive.  - Continue current ECMO support, continue incremental/gradual sweep wean => wake up and drop sweep to 3.5.  - Bivalirudin for PTT 60-80.  - Give Lasix 40 mg IV bid today.  - Antibiotic coverage with cefepime - Solumedrol for severe COPD.  - Anticipate prolonged course.  2. Atrial fibrillation: He has been in atrial fibrillation on all ECGs since 2021, suspect chronic at this point. On Eliquis at home.  - Will manage on bivalirudin.  - Off amiodarone, increase metoprolol to 25 mg every 8 hrs.  - Consider cardioversion in future.  3.  AKI: Creatinine 1.6 => 2.37 => 1.85 => 1.63 => 1.27 => 1.31.  BUN is markedly high but coming down.  Urine looks clear, LDH not significantly elevated. - Lasix 40 mg IV bid today.  4. Type 2 DM: SSI.  5. Influenza: Severe.  Completed oseltamivir.  6. COPD: Still smokes 1-2 cigs/day.  Intermittent home oxygen.  Followed by pulmonary, has been stable at home for years.  7. HIV: Last viral load was undetectable.  On treatment.  8. Shock: Suspect primarily septic shock.  Echo showed EF 55-60% with normal RV. Now off NE, resolved.  9. FEN: Tube feeds.  10.  Heme: hgb 8.4 today, had 2 units PRBCs 11/9. 11. Hypernatremia: Continue free water  250 cc q4 hrs.   CRITICAL CARE Performed by: Loralie Champagne  Total critical care time: 40 minutes  Critical care time was exclusive of separately billable procedures and treating other patients.  Critical care was necessary to treat or prevent imminent or life-threatening deterioration.  Critical care was time spent personally by me on the following activities: development of treatment plan with patient and/or surrogate as well as nursing, discussions with consultants, evaluation of patient's response to treatment, examination of patient, obtaining history from patient or surrogate, ordering and performing treatments and interventions, ordering and review of laboratory studies, ordering and review of radiographic studies, pulse oximetry and re-evaluation of patient's condition.     Length of Stay: 9  Loralie Champagne, MD  06/22/2021, 7:55 AM  Advanced Heart Failure Team Pager [phone] (M-F; 7a - 5p)  Please contact [facility] for night-coverage after hours (5p -7a ) and weekends on [email or web address]

## 2021-06-22 NOTE — Progress Notes (Signed)
NAME:  Chris Gerhardt., MRN:  [id number], DOB:  1965-09-17, LOS: 9 ADMISSION DATE:  06/19/2021, CONSULTATION DATE:  06/15/2019 REFERRING MD:  Ruthann Cancer, CHIEF COMPLAINT:  hypercapnic respiratory failure   History of Present Illness:   Chris Yant. is an 55 y.o. male. HPI: 55 year old man with a significant history of pre-existing Gold stage III severe COPD for over 10 years on intermittent home oxygen.  More dependent on oxygen recently but apparently good quality of life nonetheless.  Patient is as active as a breathing allows.  HIV disease with undetectable viral load, compliant with HART.  Last CD4 count 350.   Previously admitted 2019 with ARDS secondary to severe influenza.  Required tracheostomy at that time but was successfully decannulated and returned to baseline level of function.   Presented 11/3 with increasing shortness of breath and fevers.  Was initially started on BiPAP and found to be influenza A positive.  Eventually required intubation 11/3.   Consulted for ECMO 11/4 for refractory hypoxia and hypercapnia.  Pertinent  Medical History   Past Medical History:  Diagnosis Date   Anxiety    Atrial fibrillation (HCC)    CHF (congestive heart failure) (HCC)    COPD (chronic obstructive pulmonary disease) ([place])    Emphysema [[name]]   Depression    Diabetes mellitus without complication ([place])    type 2   Emphysema of lung ([place])    GERD (gastroesophageal reflux disease)    HIV disease ([place])    Hypertension    Oxygen deficiency     Significant Hospital Events: Including procedures, antibiotic start and stop dates in addition to other pertinent events   11/7- VV ECMO started 11/9 - weaning sedation. Transfused 2 units PRBC to keep O2 carrying capacity.  11/9 - remains agitated with wake up  11/10 - tracheostomy tube placed  Interim History / Subjective:   No issues overnight. Sedation better. He is slowly waking up.   Objective   Blood pressure 121/61, pulse  81, temperature 98.3 F (36.8 C), temperature source Oral, resp. rate (!) 21, height 5\' 9"  (1.753 m), weight 116 kg, SpO2 95 %.    Vent Mode: PCV FiO2 (%):  [40 %] 40 % Set Rate:  [18 bmp] 18 bmp PEEP:  [10 cmH20] 10 cmH20 Plateau Pressure:  [22 cmH20-34 cmH20] 22 cmH20   Intake/Output Summary (Last 24 hours) at 06/22/2021 0716 Last data filed at 06/22/2021 0700 Gross per 24 hour  Intake 6417.28 ml  Output 3650 ml  Net 2767.28 ml   Filed Weights   06/20/21 0500 06/21/21 0222 06/22/21 0500  Weight: 110.6 kg 114 kg 116 kg    Examination: General appearance: 55 y.o., male, obese male  Eyes: opens eyes   HENT: NCAT tracking  Lungs: BL vented  CV: RRR, s1 s2  Abdomen: soft, nt nd  Extremities: dependent edema  Neuro: Alert when sedation lightened    Ancillary tests:   ABG    Component Value Date/Time   PHART 7.354 06/22/2021 0357   PCO2ART 55.2 (H) 06/22/2021 0357   PO2ART 77 (L) 06/22/2021 0357   HCO3 30.9 (H) 06/22/2021 0357   TCO2 33 (H) 06/22/2021 0357   ACIDBASEDEF 3.2 (H) 12/14/2017 2135   O2SAT 95.0 06/22/2021 0357     Assessment & Plan:   Critically ill due to acute on chronic hypoxic and hypercapnic respiratory failure from influenza pneumonia on background of severe COPD.   Critically ill due to rapid atrial fibrillation requiring titration  of amiodarone Stage III COPD HIV disease well-controlled on ART Undiagnosed OSA. GERD History of diastolic heart failure Type 2 diabetes with uncontrolled hyperglycemia.  Hypernatremia from insensitive losses.   Plan:  Stable on vent and post tracheostomy  Continue routine post trach care  Follow H&H Cefepime continued  Continue beta-blockade  Bivalirudin for AC continued  Monitor for signs of bleeding  Increase oral oxycodone  Continue precedex  Try to decrease fentanyl infusion   Best Practice (right click and "Reselect all SmartList Selections" daily)   Diet/type: tubefeeds on hold for trach. DVT  prophylaxis: bivalirudin - on hold for tracheostomy GI prophylaxis: PPI Lines: Central line and Arterial Line Foley:  Yes, and it is still needed Code Status:  full code Last date of multidisciplinary goals of care discussion: continue for family updates   Hopeful bridge to recovery.   Labs   CBC: Recent Labs  Lab 06/14/2021 0257 06/24/2021 0408 06/20/21 1700 06/20/21 1725 06/21/21 0411 06/21/21 0803 06/21/21 0904 06/21/21 0936 06/21/21 1603 06/21/21 1707 06/21/21 1805 06/21/21 2212 06/22/21 0357  WBC 7.9   < > 9.6  --  8.8  --  11.2*  --  13.1*  --   --   --  12.6*  NEUTROABS 6.3  --   --   --   --   --   --   --   --   --   --   --   --   HGB 12.4*   < > 8.4*   < > 8.6*   < > 8.1*   < > 8.8* 8.2* 8.2* 8.5* 8.4*  7.8*  HCT 43.3   < > 29.0*   < > 30.5*   < > 27.8*   < > 30.5* 24.0* 24.0* 25.0* 28.2*  23.0*  MCV 100.5*   < > 99.3  --  100.0  --  100.4*  --  100.7*  --   --   --  99.3  PLT 157   < > 138*  --  125*  --  135*  --  140*  --   --   --  136*   < > = values in this interval not displayed.    Basic Metabolic Panel: Recent Labs  Lab 06/16/21 0308 06/16/21 1100 06/16/21 2214 06/16/2021 0257 07/10/2021 1857 07/03/2021 1905 06/18/21 0329 06/18/21 0334 06/20/21 0303 06/20/21 0310 06/20/21 1700 06/20/21 1725 06/21/21 0411 06/21/21 0803 06/21/21 1603 06/21/21 1707 06/21/21 1805 06/21/21 2212 06/22/21 0357  NA 139   < >  --    < > 140   < > 140   < > 149*   < > 152*   < > 157*   < > 152* 155* 155* 155* 149*  156*  K 5.5*   < >  --    < > 5.5*   < > 5.1   < > 5.4*   < > 5.6*   < > 5.1   < > 5.6* 5.6* 5.5* 5.4* 4.9  5.0  CL 99   < >  --    < > 98  --  97*   < > 114*  --  117*  --  122*  --  120*  --   --   --  116*  CO2 35*   < >  --    < > 30  --  34*   < > 30  --  30  --  30  --  29  --   --   --  28  GLUCOSE 236*   < >  --    < > 239*  --  345*   < > 155*  --  253*  --  154*  --  191*  --   --   --  145*  BUN 54*   < >  --    < > 86*  --  85*   < > 98*  --   83*  --  75*  --  74*  --   --   --  66*  CREATININE 1.68*   < >  --    < > 2.79*  --  2.37*   < > 1.63*  --  1.31*  --  1.27*  --  1.34*  --   --   --  1.31*  CALCIUM 9.0   < >  --    < > 8.0*  --  7.8*   < > 8.6*  --  8.6*  --  8.9  --  8.9  --   --   --  8.9  MG 2.9*  --  3.4*  --  2.9*  --  3.1*  --   --   --   --   --   --   --   --   --   --   --   --    < > = values in this interval not displayed.   GFR: Estimated Creatinine Clearance: 80 mL/min (A) (by C-G formula based on SCr of 1.31 mg/dL (H)). Recent Labs  Lab 06/20/21 1607 06/20/21 1700 06/21/21 0411 06/21/21 0904 06/21/21 1603 06/21/21 1607 06/22/21 0357  WBC  --    < > 8.8 11.2* 13.1*  --  12.6*  LATICACIDVEN 0.8  --  0.7  --   --  0.9 0.6   < > = values in this interval not displayed.    Liver Function Tests: Recent Labs  Lab 06/18/21 0329 06/19/21 0312 06/20/21 0303 06/21/21 0411 06/22/21 0357  AST 115* 112* 79* 66* 62*  ALT 31 33 36 42 56*  ALKPHOS 33* 37* 42 38 41  BILITOT 1.4* 1.2 1.0 0.8 0.6  PROT 5.8* 5.9* 6.0* 6.0* 6.2*  ALBUMIN 2.1* 1.9* 1.9* 1.9* 1.8*   No results for input(s): LIPASE, AMYLASE in the last 168 hours. No results for input(s): AMMONIA in the last 168 hours.  ABG    Component Value Date/Time   PHART 7.354 06/22/2021 0357   PCO2ART 55.2 (H) 06/22/2021 0357   PO2ART 77 (L) 06/22/2021 0357   HCO3 30.9 (H) 06/22/2021 0357   TCO2 33 (H) 06/22/2021 0357   ACIDBASEDEF 3.2 (H) 12/14/2017 2135   O2SAT 95.0 06/22/2021 0357      Coagulation Profile: Recent Labs  Lab 06/18/21 0329 06/19/21 0312 06/20/21 0303 06/21/21 0411 06/22/21 0357  INR 2.1* 1.7* 1.7* 1.9* 2.0*    Cardiac Enzymes: No results for input(s): CKTOTAL, CKMB, CKMBINDEX, TROPONINI in the last 168 hours.  HbA1C: HbA1c, POC (controlled diabetic range)  Date/Time Value Ref Range Status  04/05/2021 02:09 PM 7.8 (A) 0.0 - 7.0 % Final  04/20/2020 11:13 AM 8.2 (A) 0.0 - 7.0 % Final   Hgb A1c MFr Bld  Date/Time  Value Ref Range Status  07/07/2021 04:12 AM 6.8 (H) 4.8 - 5.6 % Final    Comment:    (NOTE) Pre diabetes:  5.7%-6.4%  Diabetes:              >6.4%  Glycemic control for   <7.0% adults with diabetes   10/30/2020 02:25 AM 7.3 (H) 4.8 - 5.6 % Final    Comment:    (NOTE) Pre diabetes:          5.7%-6.4%  Diabetes:              >6.4%  Glycemic control for   <7.0% adults with diabetes     CBG: Recent Labs  Lab 06/21/21 2210 06/22/21 0006 06/22/21 0205 06/22/21 0354 06/22/21 0608  GLUCAP 175* 158* 142* 142* 123*    This patient is critically ill with multiple organ system failure; which, requires frequent high complexity decision making, assessment, support, evaluation, and titration of therapies. This was completed through the application of advanced monitoring technologies and extensive interpretation of multiple databases. During this encounter critical care time was devoted to patient care services described in this note for 36 minutes.    Garner Nash, DO [facility] 06/22/2021 7:16 AM

## 2021-06-23 ENCOUNTER — Inpatient Hospital Stay (HOSPITAL_COMMUNITY): Payer: Medicare HMO

## 2021-06-23 DIAGNOSIS — J9621 Acute and chronic respiratory failure with hypoxia: Secondary | ICD-10-CM | POA: Diagnosis not present

## 2021-06-23 DIAGNOSIS — J101 Influenza due to other identified influenza virus with other respiratory manifestations: Secondary | ICD-10-CM | POA: Diagnosis not present

## 2021-06-23 DIAGNOSIS — J9622 Acute and chronic respiratory failure with hypercapnia: Secondary | ICD-10-CM | POA: Diagnosis not present

## 2021-06-23 LAB — POCT I-STAT 7, (LYTES, BLD GAS, ICA,H+H)
Acid-Base Excess: 2 mmol/L (ref 0.0–2.0)
Acid-Base Excess: 2 mmol/L (ref 0.0–2.0)
Acid-Base Excess: 3 mmol/L — ABNORMAL HIGH (ref 0.0–2.0)
Acid-Base Excess: 3 mmol/L — ABNORMAL HIGH (ref 0.0–2.0)
Acid-Base Excess: 3 mmol/L — ABNORMAL HIGH (ref 0.0–2.0)
Acid-Base Excess: 3 mmol/L — ABNORMAL HIGH (ref 0.0–2.0)
Acid-Base Excess: 4 mmol/L — ABNORMAL HIGH (ref 0.0–2.0)
Acid-Base Excess: 4 mmol/L — ABNORMAL HIGH (ref 0.0–2.0)
Acid-Base Excess: 4 mmol/L — ABNORMAL HIGH (ref 0.0–2.0)
Acid-Base Excess: 5 mmol/L — ABNORMAL HIGH (ref 0.0–2.0)
Bicarbonate: 27.4 mmol/L (ref 20.0–28.0)
Bicarbonate: 28.1 mmol/L — ABNORMAL HIGH (ref 20.0–28.0)
Bicarbonate: 28.6 mmol/L — ABNORMAL HIGH (ref 20.0–28.0)
Bicarbonate: 28.7 mmol/L — ABNORMAL HIGH (ref 20.0–28.0)
Bicarbonate: 28.7 mmol/L — ABNORMAL HIGH (ref 20.0–28.0)
Bicarbonate: 28.9 mmol/L — ABNORMAL HIGH (ref 20.0–28.0)
Bicarbonate: 29.2 mmol/L — ABNORMAL HIGH (ref 20.0–28.0)
Bicarbonate: 29.3 mmol/L — ABNORMAL HIGH (ref 20.0–28.0)
Bicarbonate: 29.5 mmol/L — ABNORMAL HIGH (ref 20.0–28.0)
Bicarbonate: 29.8 mmol/L — ABNORMAL HIGH (ref 20.0–28.0)
Calcium, Ion: 1.28 mmol/L (ref 1.15–1.40)
Calcium, Ion: 1.29 mmol/L (ref 1.15–1.40)
Calcium, Ion: 1.29 mmol/L (ref 1.15–1.40)
Calcium, Ion: 1.29 mmol/L (ref 1.15–1.40)
Calcium, Ion: 1.3 mmol/L (ref 1.15–1.40)
Calcium, Ion: 1.3 mmol/L (ref 1.15–1.40)
Calcium, Ion: 1.3 mmol/L (ref 1.15–1.40)
Calcium, Ion: 1.31 mmol/L (ref 1.15–1.40)
Calcium, Ion: 1.32 mmol/L (ref 1.15–1.40)
Calcium, Ion: 1.34 mmol/L (ref 1.15–1.40)
HCT: 22 % — ABNORMAL LOW (ref 39.0–52.0)
HCT: 22 % — ABNORMAL LOW (ref 39.0–52.0)
HCT: 22 % — ABNORMAL LOW (ref 39.0–52.0)
HCT: 23 % — ABNORMAL LOW (ref 39.0–52.0)
HCT: 23 % — ABNORMAL LOW (ref 39.0–52.0)
HCT: 23 % — ABNORMAL LOW (ref 39.0–52.0)
HCT: 23 % — ABNORMAL LOW (ref 39.0–52.0)
HCT: 24 % — ABNORMAL LOW (ref 39.0–52.0)
HCT: 24 % — ABNORMAL LOW (ref 39.0–52.0)
HCT: 24 % — ABNORMAL LOW (ref 39.0–52.0)
Hemoglobin: 7.5 g/dL — ABNORMAL LOW (ref 13.0–17.0)
Hemoglobin: 7.5 g/dL — ABNORMAL LOW (ref 13.0–17.0)
Hemoglobin: 7.5 g/dL — ABNORMAL LOW (ref 13.0–17.0)
Hemoglobin: 7.8 g/dL — ABNORMAL LOW (ref 13.0–17.0)
Hemoglobin: 7.8 g/dL — ABNORMAL LOW (ref 13.0–17.0)
Hemoglobin: 7.8 g/dL — ABNORMAL LOW (ref 13.0–17.0)
Hemoglobin: 7.8 g/dL — ABNORMAL LOW (ref 13.0–17.0)
Hemoglobin: 8.2 g/dL — ABNORMAL LOW (ref 13.0–17.0)
Hemoglobin: 8.2 g/dL — ABNORMAL LOW (ref 13.0–17.0)
Hemoglobin: 8.2 g/dL — ABNORMAL LOW (ref 13.0–17.0)
O2 Saturation: 88 %
O2 Saturation: 90 %
O2 Saturation: 91 %
O2 Saturation: 91 %
O2 Saturation: 92 %
O2 Saturation: 92 %
O2 Saturation: 95 %
O2 Saturation: 97 %
O2 Saturation: 97 %
O2 Saturation: 98 %
Patient temperature: 36.7
Patient temperature: 36.7
Patient temperature: 36.8
Patient temperature: 37.2
Patient temperature: 37.2
Patient temperature: 37.3
Patient temperature: 37.4
Patient temperature: 37.4
Patient temperature: 98.9
Patient temperature: 99.7
Potassium: 3.6 mmol/L (ref 3.5–5.1)
Potassium: 3.7 mmol/L (ref 3.5–5.1)
Potassium: 3.8 mmol/L (ref 3.5–5.1)
Potassium: 4 mmol/L (ref 3.5–5.1)
Potassium: 4.2 mmol/L (ref 3.5–5.1)
Potassium: 4.2 mmol/L (ref 3.5–5.1)
Potassium: 4.3 mmol/L (ref 3.5–5.1)
Potassium: 4.3 mmol/L (ref 3.5–5.1)
Potassium: 4.3 mmol/L (ref 3.5–5.1)
Potassium: 4.4 mmol/L (ref 3.5–5.1)
Sodium: 151 mmol/L — ABNORMAL HIGH (ref 135–145)
Sodium: 151 mmol/L — ABNORMAL HIGH (ref 135–145)
Sodium: 151 mmol/L — ABNORMAL HIGH (ref 135–145)
Sodium: 152 mmol/L — ABNORMAL HIGH (ref 135–145)
Sodium: 152 mmol/L — ABNORMAL HIGH (ref 135–145)
Sodium: 153 mmol/L — ABNORMAL HIGH (ref 135–145)
Sodium: 153 mmol/L — ABNORMAL HIGH (ref 135–145)
Sodium: 154 mmol/L — ABNORMAL HIGH (ref 135–145)
Sodium: 154 mmol/L — ABNORMAL HIGH (ref 135–145)
Sodium: 154 mmol/L — ABNORMAL HIGH (ref 135–145)
TCO2: 29 mmol/L (ref 22–32)
TCO2: 29 mmol/L (ref 22–32)
TCO2: 30 mmol/L (ref 22–32)
TCO2: 30 mmol/L (ref 22–32)
TCO2: 30 mmol/L (ref 22–32)
TCO2: 30 mmol/L (ref 22–32)
TCO2: 31 mmol/L (ref 22–32)
TCO2: 31 mmol/L (ref 22–32)
TCO2: 31 mmol/L (ref 22–32)
TCO2: 31 mmol/L (ref 22–32)
pCO2 arterial: 43 mmHg (ref 32.0–48.0)
pCO2 arterial: 44.5 mmHg (ref 32.0–48.0)
pCO2 arterial: 44.8 mmHg (ref 32.0–48.0)
pCO2 arterial: 45.4 mmHg (ref 32.0–48.0)
pCO2 arterial: 48.6 mmHg — ABNORMAL HIGH (ref 32.0–48.0)
pCO2 arterial: 49.4 mmHg — ABNORMAL HIGH (ref 32.0–48.0)
pCO2 arterial: 50.5 mmHg — ABNORMAL HIGH (ref 32.0–48.0)
pCO2 arterial: 52.5 mmHg — ABNORMAL HIGH (ref 32.0–48.0)
pCO2 arterial: 52.9 mmHg — ABNORMAL HIGH (ref 32.0–48.0)
pCO2 arterial: 54 mmHg — ABNORMAL HIGH (ref 32.0–48.0)
pH, Arterial: 7.334 — ABNORMAL LOW (ref 7.350–7.450)
pH, Arterial: 7.352 (ref 7.350–7.450)
pH, Arterial: 7.363 (ref 7.350–7.450)
pH, Arterial: 7.369 (ref 7.350–7.450)
pH, Arterial: 7.378 (ref 7.350–7.450)
pH, Arterial: 7.383 (ref 7.350–7.450)
pH, Arterial: 7.397 (ref 7.350–7.450)
pH, Arterial: 7.405 (ref 7.350–7.450)
pH, Arterial: 7.422 (ref 7.350–7.450)
pH, Arterial: 7.432 (ref 7.350–7.450)
pO2, Arterial: 100 mmHg (ref 83.0–108.0)
pO2, Arterial: 102 mmHg (ref 83.0–108.0)
pO2, Arterial: 54 mmHg — ABNORMAL LOW (ref 83.0–108.0)
pO2, Arterial: 62 mmHg — ABNORMAL LOW (ref 83.0–108.0)
pO2, Arterial: 64 mmHg — ABNORMAL LOW (ref 83.0–108.0)
pO2, Arterial: 66 mmHg — ABNORMAL LOW (ref 83.0–108.0)
pO2, Arterial: 69 mmHg — ABNORMAL LOW (ref 83.0–108.0)
pO2, Arterial: 70 mmHg — ABNORMAL LOW (ref 83.0–108.0)
pO2, Arterial: 80 mmHg — ABNORMAL LOW (ref 83.0–108.0)
pO2, Arterial: 90 mmHg (ref 83.0–108.0)

## 2021-06-23 LAB — CBC
HCT: 26.5 % — ABNORMAL LOW (ref 39.0–52.0)
HCT: 26.4 % — ABNORMAL LOW (ref 39.0–52.0)
Hemoglobin: 8 g/dL — ABNORMAL LOW (ref 13.0–17.0)
Hemoglobin: 7.9 g/dL — ABNORMAL LOW (ref 13.0–17.0)
MCH: 30 pg (ref 26.0–34.0)
MCH: 29.6 pg (ref 26.0–34.0)
MCHC: 30.2 g/dL (ref 30.0–36.0)
MCHC: 29.9 g/dL — ABNORMAL LOW (ref 30.0–36.0)
MCV: 99.3 fL (ref 80.0–100.0)
MCV: 98.9 fL (ref 80.0–100.0)
Platelets: 147 10*3/uL — ABNORMAL LOW (ref 150–400)
Platelets: 146 10*3/uL — ABNORMAL LOW (ref 150–400)
RBC: 2.67 MIL/uL — ABNORMAL LOW (ref 4.22–5.81)
RBC: 2.67 MIL/uL — ABNORMAL LOW (ref 4.22–5.81)
RDW: 15.1 % (ref 11.5–15.5)
RDW: 14.6 % (ref 11.5–15.5)
WBC: 15.2 10*3/uL — ABNORMAL HIGH (ref 4.0–10.5)
WBC: 12.8 10*3/uL — ABNORMAL HIGH (ref 4.0–10.5)
nRBC: 1.1 % — ABNORMAL HIGH (ref 0.0–0.2)
nRBC: 1.3 % — ABNORMAL HIGH (ref 0.0–0.2)

## 2021-06-23 LAB — TYPE AND SCREEN
ABO/RH(D): A POS
Antibody Screen: NEGATIVE
Unit division: 0
Unit division: 0
Unit division: 0
Unit division: 0
Unit division: 0

## 2021-06-23 LAB — LACTIC ACID, PLASMA
Lactic Acid, Venous: 0.7 mmol/L (ref 0.5–1.9)
Lactic Acid, Venous: 0.7 mmol/L (ref 0.5–1.9)

## 2021-06-23 LAB — BASIC METABOLIC PANEL
Anion gap: 6 (ref 5–15)
Anion gap: 8 (ref 5–15)
BUN: 62 mg/dL — ABNORMAL HIGH (ref 6–20)
BUN: 58 mg/dL — ABNORMAL HIGH (ref 6–20)
CO2: 27 mmol/L (ref 22–32)
CO2: 27 mmol/L (ref 22–32)
Calcium: 8.7 mg/dL — ABNORMAL LOW (ref 8.9–10.3)
Calcium: 8.6 mg/dL — ABNORMAL LOW (ref 8.9–10.3)
Chloride: 118 mmol/L — ABNORMAL HIGH (ref 98–111)
Chloride: 113 mmol/L — ABNORMAL HIGH (ref 98–111)
Creatinine, Ser: 1.43 mg/dL — ABNORMAL HIGH (ref 0.61–1.24)
Creatinine, Ser: 1.33 mg/dL — ABNORMAL HIGH (ref 0.61–1.24)
GFR, Estimated: 58 mL/min — ABNORMAL LOW (ref >60)
GFR, Estimated: >60 mL/min (ref >60)
Glucose, Bld: 148 mg/dL — ABNORMAL HIGH (ref 70–99)
Glucose, Bld: 185 mg/dL — ABNORMAL HIGH (ref 70–99)
Potassium: 4.2 mmol/L (ref 3.5–5.1)
Potassium: 3.8 mmol/L (ref 3.5–5.1)
Sodium: 151 mmol/L — ABNORMAL HIGH (ref 135–145)
Sodium: 148 mmol/L — ABNORMAL HIGH (ref 135–145)

## 2021-06-23 LAB — APTT
aPTT: 65 seconds — ABNORMAL HIGH (ref 24–36)
aPTT: 65 seconds — ABNORMAL HIGH (ref 24–36)

## 2021-06-23 LAB — GLUCOSE, CAPILLARY
Glucose-Capillary: 124 mg/dL — ABNORMAL HIGH (ref 70–99)
Glucose-Capillary: 133 mg/dL — ABNORMAL HIGH (ref 70–99)
Glucose-Capillary: 136 mg/dL — ABNORMAL HIGH (ref 70–99)
Glucose-Capillary: 136 mg/dL — ABNORMAL HIGH (ref 70–99)
Glucose-Capillary: 147 mg/dL — ABNORMAL HIGH (ref 70–99)
Glucose-Capillary: 147 mg/dL — ABNORMAL HIGH (ref 70–99)
Glucose-Capillary: 158 mg/dL — ABNORMAL HIGH (ref 70–99)
Glucose-Capillary: 169 mg/dL — ABNORMAL HIGH (ref 70–99)
Glucose-Capillary: 170 mg/dL — ABNORMAL HIGH (ref 70–99)
Glucose-Capillary: 171 mg/dL — ABNORMAL HIGH (ref 70–99)
Glucose-Capillary: 171 mg/dL — ABNORMAL HIGH (ref 70–99)
Glucose-Capillary: 175 mg/dL — ABNORMAL HIGH (ref 70–99)
Glucose-Capillary: 177 mg/dL — ABNORMAL HIGH (ref 70–99)
Glucose-Capillary: 180 mg/dL — ABNORMAL HIGH (ref 70–99)
Glucose-Capillary: 181 mg/dL — ABNORMAL HIGH (ref 70–99)
Glucose-Capillary: 187 mg/dL — ABNORMAL HIGH (ref 70–99)

## 2021-06-23 LAB — BPAM RBC
Blood Product Expiration Date: 202211212359
Blood Product Expiration Date: 202211212359
Blood Product Expiration Date: 202211212359
Blood Product Expiration Date: 202211212359
Blood Product Expiration Date: 202211242359
ISSUE DATE / TIME: 202211111016
Unit Type and Rh: 6200
Unit Type and Rh: 6200
Unit Type and Rh: 6200
Unit Type and Rh: 6200
Unit Type and Rh: 6200

## 2021-06-23 LAB — HEPATIC FUNCTION PANEL
ALT: 92 U/L — ABNORMAL HIGH (ref 0–44)
AST: 90 U/L — ABNORMAL HIGH (ref 15–41)
Albumin: 1.7 g/dL — ABNORMAL LOW (ref 3.5–5.0)
Alkaline Phosphatase: 40 U/L (ref 38–126)
Bilirubin, Direct: 0.2 mg/dL (ref 0.0–0.2)
Indirect Bilirubin: 0.4 mg/dL (ref 0.3–0.9)
Total Bilirubin: 0.6 mg/dL (ref 0.3–1.2)
Total Protein: 5.9 g/dL — ABNORMAL LOW (ref 6.5–8.1)

## 2021-06-23 LAB — MAGNESIUM: Magnesium: 2.4 mg/dL (ref 1.7–2.4)

## 2021-06-23 LAB — PREPARE RBC (CROSSMATCH)

## 2021-06-23 LAB — PROTIME-INR
INR: 1.9 — ABNORMAL HIGH (ref 0.8–1.2)
Prothrombin Time: 21.5 seconds — ABNORMAL HIGH (ref 11.4–15.2)

## 2021-06-23 LAB — FIBRINOGEN: Fibrinogen: 800 mg/dL — ABNORMAL HIGH (ref 210–475)

## 2021-06-23 LAB — LACTATE DEHYDROGENASE: LDH: 300 U/L — ABNORMAL HIGH (ref 98–192)

## 2021-06-23 MED ORDER — METOLAZONE 2.5 MG PO TABS
2.5000 mg | ORAL_TABLET | Freq: Every day | ORAL | Status: DC
  Filled 2021-06-23: qty 1

## 2021-06-23 MED ORDER — METOLAZONE 2.5 MG PO TABS: 2.5000 mg | ORAL_TABLET | Freq: Every day | ORAL | Status: DC

## 2021-06-23 MED ORDER — FREE WATER
300.0000 mL | Status: DC
Start: 2021-06-23 — End: 2021-06-24
  Administered 2021-06-23 – 2021-06-24 (×6): 300 mL

## 2021-06-23 MED ORDER — FUROSEMIDE 10 MG/ML IJ SOLN
40.0000 mg | Freq: Two times a day (BID) | INTRAMUSCULAR | Status: AC
  Administered 2021-06-23 (×2): 40 mg via INTRAVENOUS
  Filled 2021-06-23 (×2): qty 4

## 2021-06-23 MED ORDER — VALPROIC ACID 250 MG/5ML PO SOLN
250.0000 mg | Freq: Two times a day (BID) | ORAL | Status: DC
  Administered 2021-06-23 – 2021-06-24 (×3): 250 mg
  Filled 2021-06-23 (×3): qty 5

## 2021-06-23 MED ORDER — SODIUM CHLORIDE 0.9% IV SOLUTION: Freq: Once | INTRAVENOUS | Status: AC

## 2021-06-23 MED ORDER — ARFORMOTEROL TARTRATE 15 MCG/2ML IN NEBU
15.0000 ug | INHALATION_SOLUTION | Freq: Two times a day (BID) | RESPIRATORY_TRACT | Status: DC
Start: 2021-06-23 — End: 2021-07-05
  Administered 2021-06-23 – 2021-07-04 (×24): 15 ug via RESPIRATORY_TRACT
  Filled 2021-06-23 (×24): qty 2

## 2021-06-23 MED ORDER — AMIODARONE HCL IN DEXTROSE 360-4.14 MG/200ML-% IV SOLN
30.0000 mg/h | INTRAVENOUS | Status: DC
  Administered 2021-06-23 – 2021-06-24 (×3): 30 mg/h via INTRAVENOUS
  Administered 2021-06-24: 60 mg/h via INTRAVENOUS
  Administered 2021-06-24 – 2021-06-26 (×4): 30 mg/h via INTRAVENOUS
  Filled 2021-06-23 (×8): qty 200

## 2021-06-23 MED ORDER — REVEFENACIN 175 MCG/3ML IN SOLN
175.0000 ug | Freq: Every day | RESPIRATORY_TRACT | Status: DC
Start: 2021-06-23 — End: 2021-07-05
  Administered 2021-06-23 – 2021-07-04 (×12): 175 ug via RESPIRATORY_TRACT
  Filled 2021-06-23 (×14): qty 3

## 2021-06-23 MED ORDER — CLONAZEPAM 1 MG PO TABS
1.0000 mg | ORAL_TABLET | Freq: Three times a day (TID) | ORAL | Status: DC
  Administered 2021-06-23 – 2021-06-24 (×6): 1 mg
  Filled 2021-06-23 (×6): qty 1

## 2021-06-23 MED ORDER — QUETIAPINE FUMARATE 100 MG PO TABS
100.0000 mg | ORAL_TABLET | Freq: Two times a day (BID) | ORAL | Status: DC
Start: 2021-06-23 — End: 2021-07-05
  Administered 2021-06-23 – 2021-07-05 (×23): 100 mg
  Filled 2021-06-23 (×24): qty 1

## 2021-06-23 MED ORDER — METOLAZONE 2.5 MG PO TABS
2.5000 mg | ORAL_TABLET | Freq: Every day | ORAL | Status: DC
  Administered 2021-06-23: 2.5 mg

## 2021-06-23 NOTE — Progress Notes (Addendum)
NAME:  Chris Shell., MRN:  [id number], DOB:  [birth year], LOS: 10 ADMISSION DATE:  06/22/2021, CONSULTATION DATE:  06/15/2019 REFERRING MD:  Gaynell Face, CHIEF COMPLAINT:  hypercapnic respiratory failure   History of Present Illness:   55 year old man with O2 dependent COPD presenting with acute on chronic respiratory failure now on VV ECMO due to refractory hypoxia and hypercapnia.  Pertinent  Medical History   Past Medical History:  Diagnosis Date   Anxiety    Atrial fibrillation (HCC)    CHF (congestive heart failure) (HCC)    COPD (chronic obstructive pulmonary disease) (HCC)    Emphysema [[name]]   Depression    Diabetes mellitus without complication (HCC)    type 2   Emphysema of lung (HCC)    GERD (gastroesophageal reflux disease)    HIV disease (HCC)    Hypertension    Oxygen deficiency     Significant Hospital Events: Including procedures, antibiotic start and stop dates in addition to other pertinent events   11/7- VV ECMO started 11/9 - weaning sedation. Transfused 2 units PRBC to keep O2 carrying capacity.  11/9 - remains agitated with wake up  11/10 - tracheostomy tube placed  Interim History / Subjective:   No issues overnight. Sedation better. He is slowly waking up.   Objective   Blood pressure (!) 125/57, pulse (!) 121, temperature 99.1 F (37.3 C), temperature source Core, resp. rate (!) 30, height 5\' 9"  (1.753 m), weight 116 kg, SpO2 (!) 80 %.    Vent Mode: PCV FiO2 (%):  [40 %] 40 % Set Rate:  [18 bmp] 18 bmp PEEP:  [10 cmH20] 10 cmH20 Plateau Pressure:  [22 cmH20-25 cmH20] 24 cmH20   Intake/Output Summary (Last 24 hours) at 06/23/2021 06/25/2021 Last data filed at 06/23/2021 0700 Gross per 24 hour  Intake 4920.14 ml  Output 5880 ml  Net -959.86 ml    Filed Weights   06/20/21 0500 06/21/21 0222 06/22/21 0500  Weight: 110.6 kg 114 kg 116 kg    Examination: Pupils small but equal, reactive RASS -5 Lung sounds diminished Lung mechanics look  okay on vent Mild diffuse anasarca RIJ VV ECMO cannula with good color change Trach with dried blood, small thick secretions Heart sounds irregular, ext warm  Ancillary tests:   Sodium stable 151 Lytes/Cr stable Mildly worsening transaminitis Lactate benign LDH 300, ~stable  Assessment & Plan:   Acute on chronic hypoxic and hypercapnic respiratory failure from influenza pneumonia on background of severe COPD, prior tracheostomy. Afib/RVR, rate control results in shock state now on both BB and pressors?: this is being addressed Stage III COPD HIV disease well-controlled on ART Undiagnosed OSA. GERD History of diastolic heart failure Type 2 diabetes with uncontrolled hyperglycemia.  Hypernatremia from insensitive losses.  MSSA and pseudomonas HCAP  Today: WBC and fever curve creeping up, weight going up, sweep weans limited by baseline lung disease  Plan:  Lasix per CHF team, will add some metolazone to hopefully help with natriuresisis  Continue dual lumen single catheter VV ECMO Continue PC at current settings Push sweep wean today including going off LTVV  On cefepime, check trach aspirate for clearance, may need longer course  RE: fevers, sometimes precedex can do otherwise will need to think about switching out foley.  Increase klonipin, seroquel, wean precedex to see if this improves temps.  Switching back to amiodarone, BB discontinued, levophed PRN  Switch from duonebs to brovana yupelri  Watch H/H, continue bival; transfusion threshold  hemoglobin 7  Overall need to get volume off; increase vent settings, may need to consider ultrafiltration; best case here is getting him vent-dependent and in [facility] for a while until reconditioned; tough to wean when in 2019 his FEV1 was 1.1L   Best Practice (right click and "Reselect all SmartList Selections" daily)   Diet/type: tubefeeds  DVT prophylaxis: bivalirudin GI prophylaxis: PPI Lines: Central line and Arterial  Line Foley:  consider switching out 11/14 or doing condom cath Code Status:  full code Last date of multidisciplinary goals of care discussion: continue for family updates   85 minutes critical care time independent of procedures  Erskine Emery MD PCCM

## 2021-06-23 NOTE — Progress Notes (Addendum)
Patient ID: Chris Round., male   DOB: 1965-08-26, 55 y.o.   MRN: [id number]     Advanced Heart Failure Rounding Note  PCP-Cardiologist: Glenetta Hew, MD   Subjective:    11/7: VV ECMO cannulation 11/10: Tracheostomy  This morning, patient is on NE 1 with stable BP.  HR 110s in atrial fibrillation, now on metoprolol 25 q8 hrs.   Afebrile, on cefepime.  Oseltamivir for influenza completed.  Solumedrol with severe COPD.   AKI with creatinine 2.37 => 1.85 => 1.63 => 1.27 => 1.31 => 1.43.  I/Os negative on Lasix 40 mg IV bid yesterday.   FiO2 0.4 on vent, Vt 430 cc. Sweep decreased to 3.5 yesterday but did not tolerate and increased again to 4.5.   Patient wakes up with sedation wean, agitated.   TFs ongoing.   ECMO: 3440 rpm Flow 4.56 Pvenous -77 DeltaP 26 Sweep 4.5 ABG 7.35/53/66/91% PTT 65 (goal 60-80), on bivalirudin Lactate 1.6 => 0.8 => 0.8 => 0.7 => 0.6 => 0.7 LDH 323 => 356 => 310 => 278 => 262 => 300  Na 151, on free H20 boluses 250 q8hrs  CXR: Left effusion + atelectasis, stable cannula position   Objective:   Weight Range: 116 kg Body mass index is 37.77 kg/m.   Vital Signs:   Temp:  [98.8 F (37.1 C)-99.3 F (37.4 C)] 99.1 F (37.3 C) (11/13 0400) Pulse Rate:  [102-121] 121 (11/12 1605) Resp:  [15-30] 30 (11/13 0700) BP: (93-125)/(51-57) 125/57 (11/12 1605) SpO2:  [80 %-96 %] 80 % (11/13 0700) Arterial Line BP: (89-154)/(44-75) 124/59 (11/13 0700) FiO2 (%):  [40 %] 40 % (11/13 0600) Last BM Date: 06/21/21  Weight change: Filed Weights   06/20/21 0500 06/21/21 0222 06/22/21 0500  Weight: 110.6 kg 114 kg 116 kg    Intake/Output:   Intake/Output Summary (Last 24 hours) at 06/23/2021 0740 Last data filed at 06/23/2021 0700 Gross per 24 hour  Intake 4920.14 ml  Output 5880 ml  Net -959.86 ml      Physical Exam    General: Intubated/sedated Neck: Right IJ Crescent, LIJ CVL, no thyromegaly or thyroid nodule.  Lungs: Decreased at  bases.  CV: Nondisplaced PMI.  Heart mildly tachy, irregular S1/S2, no S3/S4, no murmur.  No peripheral edema.   Abdomen: Soft, nontender, no hepatosplenomegaly, no distention.  Skin: Intact without lesions or rashes.  Neurologic: Alert and oriented x 3.  Psych: Normal affect. Extremities: No clubbing or cyanosis.  HEENT: Normal.    Telemetry   Atrial fibrillation 110s (personally reviewed)  Labs    CBC Recent Labs    06/22/21 1631 06/22/21 1941 06/23/21 0401 06/23/21 0408  WBC 14.9*  --  15.2*  --   HGB 8.4*   < > 8.0* 7.8*  HCT 29.1*   < > 26.5* 23.0*  MCV 100.3*  --  99.3  --   PLT 147*  --  147*  --    < > = values in this interval not displayed.   Basic Metabolic Panel Recent Labs    06/22/21 1631 06/22/21 1941 06/23/21 0401 06/23/21 0408  NA 152*   < > 151* 154*  K 5.6*   < > 4.2 4.2  CL 120*  --  118*  --   CO2 26  --  27  --   GLUCOSE 225*  --  148*  --   BUN 67*  --  62*  --   CREATININE 1.43*  --  1.43*  --  CALCIUM 9.0  --  8.7*  --    < > = values in this interval not displayed.   Liver Function Tests Recent Labs    06/22/21 0357 06/23/21 0401  AST 62* 90*  ALT 56* 92*  ALKPHOS 41 40  BILITOT 0.6 0.6  PROT 6.2* 5.9*  ALBUMIN 1.8* 1.7*   No results for input(s): LIPASE, AMYLASE in the last 72 hours. Cardiac Enzymes No results for input(s): CKTOTAL, CKMB, CKMBINDEX, TROPONINI in the last 72 hours.  BNP: BNP (last 3 results) Recent Labs    10/29/20 1214  BNP 102.9*    ProBNP (last 3 results) No results for input(s): PROBNP in the last 8760 hours.   D-Dimer No results for input(s): DDIMER in the last 72 hours. Hemoglobin A1C No results for input(s): HGBA1C in the last 72 hours. Fasting Lipid Panel No results for input(s): CHOL, HDL, LDLCALC, TRIG, CHOLHDL, LDLDIRECT in the last 72 hours.  Thyroid Function Tests No results for input(s): TSH, T4TOTAL, T3FREE, THYROIDAB in the last 72 hours.  Invalid input(s): FREET3  Other  results:   Imaging    No results found.   Medications:     Scheduled Medications:  budesonide (PULMICORT) nebulizer solution  0.25 mg Nebulization BID   chlorhexidine gluconate (MEDLINE KIT)  15 mL Mouth Rinse BID   Chlorhexidine Gluconate Cloth  6 each Topical Daily   clonazePAM  1 mg Per Tube BID   emtricitabine-tenofovir AF  1 tablet Per Tube Daily   And   dolutegravir  50 mg Per Tube Daily   feeding supplement (PROSource TF)  90 mL Per Tube TID   free water  300 mL Per Tube Q4H   furosemide  40 mg Intravenous BID   insulin detemir  30 Units Subcutaneous BID   ipratropium-albuterol  3 mL Nebulization QID   mouth rinse  15 mL Mouth Rinse 10 times per day   oxyCODONE  10 mg Per Tube Q6H   pantoprazole (PROTONIX) IV  40 mg Intravenous QHS   polyethylene glycol  17 g Per Tube BID   QUEtiapine  50 mg Per Tube BID   rosuvastatin  20 mg Per Tube Daily   senna-docusate  1 tablet Per Tube BID   sodium chloride flush  10-40 mL Intracatheter Q12H   sodium chloride flush  3 mL Intravenous Q12H    Infusions:  sodium chloride     sodium chloride 10 mL/hr at 06/29/2021 1502   sodium chloride     sodium chloride 20 mL/hr at 06/23/21 0700   sodium chloride     albumin human     amiodarone     bivalirudin (ANGIOMAX) infusion 0.5 mg/mL (Non-ACS indications) 0.28 mg/kg/hr (06/23/21 0700)   ceFEPime (MAXIPIME) IV Stopped (06/23/21 0553)   dexmedetomidine (PRECEDEX) IV infusion 1.2 mcg/kg/hr (06/23/21 0700)   feeding supplement (VITAL 1.5 CAL) 65 mL/hr at 06/23/21 0600   fentaNYL infusion INTRAVENOUS 300 mcg/hr (06/23/21 0700)   insulin 7.5 Units/hr (06/23/21 0600)   norepinephrine (LEVOPHED) Adult infusion 1 mcg/min (06/23/21 0700)    PRN Medications: sodium chloride, Place/Maintain arterial line **AND** sodium chloride, acetaminophen **OR** acetaminophen, albuterol, bisacodyl, dextrose, docusate, fentaNYL, ondansetron **OR** ondansetron (ZOFRAN) IV, sodium chloride flush, sodium  chloride flush   Assessment/Plan   1. Acute hypoxemic and hypercarbic respiratory failure: In setting of COPD on home oxygen at baseline.  He has severe influenza infection.  Refractory hypoxemia and hypercarbia with maximal management.  He has underlying significant lung disease but has  reasonable quality of life and had ARDS in the past requiring tracheostomy but was able to wean off.  RESP score was acceptable.  VV ECMO cannulation on 11/7.  Tracheostomy 11/10.  This morning, lactate is 0.7.  ABG 7.35/53/66/91%.  Sweep at 4.5, unable to wean yesterday (limited by respiratory acidosis).  LDH ok.  I/Os negative, no weight yet.  - Continue current ECMO support, continue incremental/gradual sweep wean => wake up and drop sweep to 3.5, accept pH as low as 7.25.  - Bivalirudin for PTT 60-80.  - Give Lasix 40 mg IV bid again today.  - Antibiotic coverage with cefepime - Solumedrol for severe COPD.  - Anticipate prolonged course.  2. Atrial fibrillation: He has been in atrial fibrillation on all ECGs since 2021, suspect chronic at this point. On Eliquis at home. Soft BP has limited metoprolol.  - Will manage on bivalirudin.  - Stop metoprolol, start amiodarone gtt 30 mg/hr.   - Consider cardioversion in future.  3.  AKI: Creatinine 1.6 => 2.37 => 1.85 => 1.63 => 1.27 => 1.31 => 1.43.   Urine looks clear, LDH not significantly elevated. - Lasix 40 mg IV bid today.  4. Type 2 DM: SSI.  5. Influenza: Severe.  Completed oseltamivir.  6. COPD: Still smokes 1-2 cigs/day.  Intermittent home oxygen.  Followed by pulmonary, has been stable at home for years.  7. HIV: Last viral load was undetectable.  On treatment.  8. Shock: Suspect primarily septic shock.  Echo showed EF 55-60% with normal RV. Currently on NE 1. - Try to wean off NE today.  9. FEN: Tube feeds.  10. Heme: hgb 8.0 today, had 2 units PRBCs 11/9. - Transfuse < 8 (if drops any lower).  11. Hypernatremia: Na 151 today.  - Increase free  water 300 cc q4 hrs.   CRITICAL CARE Performed by: Loralie Champagne  Total critical care time: 40 minutes  Critical care time was exclusive of separately billable procedures and treating other patients.  Critical care was necessary to treat or prevent imminent or life-threatening deterioration.  Critical care was time spent personally by me on the following activities: development of treatment plan with patient and/or surrogate as well as nursing, discussions with consultants, evaluation of patient's response to treatment, examination of patient, obtaining history from patient or surrogate, ordering and performing treatments and interventions, ordering and review of laboratory studies, ordering and review of radiographic studies, pulse oximetry and re-evaluation of patient's condition.     Length of Stay: [place], MD  06/23/2021, 7:40 AM  Advanced Heart Failure Team Pager [phone] (M-F; 7a - 5p)  Please contact [facility] for night-coverage after hours (5p -7a ) and weekends on [email or web address]

## 2021-06-23 NOTE — Progress Notes (Signed)
ANTICOAGULATION CONSULT NOTE   Pharmacy Consult for Bivalirudin Indication:  ECMO and atrial fibrillation  Allergies  Allergen Reactions   Bactrim [Sulfamethoxazole-Trimethoprim] Hives    Patient Measurements: Height: 5\' 9"  (175.3 cm) Weight: 116 kg (255 lb 11.7 oz) IBW/kg (Calculated) : 70.7  Vital Signs: Temp: 99.1 F (37.3 C) (11/13 0400) Temp Source: Core (11/13 0400)  Labs: Recent Labs    06/21/21 0411 06/21/21 0803 06/22/21 0357 06/22/21 0808 06/22/21 1631 06/22/21 1710 06/22/21 1941 06/23/21 0025 06/23/21 0401 06/23/21 0408  HGB 8.6*   < > 8.4*  7.8*   < > 8.4*  --    < > 8.2* 8.0* 7.8*  HCT 30.5*   < > 28.2*  23.0*   < > 29.1*  --    < > 24.0* 26.5* 23.0*  PLT 125*   < > 136*  --  147*  --   --   --  147*  --   APTT 61*   < > 62*  --   --  65*  --   --  65*  --   LABPROT 21.7*  --  22.4*  --   --   --   --   --  21.5*  --   INR 1.9*  --  2.0*  --   --   --   --   --  1.9*  --   CREATININE 1.27*   < > 1.31*  --  1.43*  --   --   --  1.43*  --    < > = values in this interval not displayed.     Estimated Creatinine Clearance: 73.3 mL/min (A) (by C-G formula based on SCr of 1.43 mg/dL (H)).   Medical History: Past Medical History:  Diagnosis Date   Anxiety    Atrial fibrillation (HCC)    CHF (congestive heart failure) (HCC)    COPD (chronic obstructive pulmonary disease) (HCC)    Emphysema [[name]]   Depression    Diabetes mellitus without complication (HCC)    type 2   Emphysema of lung (HCC)    GERD (gastroesophageal reflux disease)    HIV disease (HCC)    Hypertension    Oxygen deficiency     Assessment: 55 Y/O male on chronic apixaban for atrial fibrillation. Previous placed on heparin in anticipation of line placement and possible procedures.  Last apixaban dose given @0948  11/7. Patient cannulated and ECMO initiated 11/7; received a heparin bolus at cannulation, then switched to bivalrudin infusion. Pharmacy to dose bivalirudin.  aPTT 65  sec at goal on bivalrudin 0.28mg /kg/hr. CBC stable - hgb 8.5 > 7.8, plt stable, LDH stable dropping 300>200s  S/p trach placed 11/10.  No pump issues.  Goal of Therapy:  aPTT 60-80 secs Monitor platelets by anticoagulation protocol: Yes   Plan:  Continue bivalirudin to 0.28 mg/kg/hr  Aptt and CBC q12hr    13/7, 13/10, BCPS, [facility] Clinical Pharmacist  06/23/2021 7:56 AM   [facility] pharmacy phone numbers are listed on [email or web address]

## 2021-06-23 NOTE — Progress Notes (Signed)
Patient ID: [name]., male   DOB: Oct 08, 1965, 55 y.o.   MRN: [id number]  Extracorporeal support note     ECLS support day: 6 Indication: Severe COPD with influenza A   Configuration: VV ECMO, Crescent catheter right IJ   Drainage cannula: Right IJ Crescent Return cannula: Right IJ Crescent   Pump speed: 3440 Pump flow: 4.56 Pump used: Cardiohelp   Sweep gas: 4.5   Circuit check: Small area of thrombus 3 o'clock  Anticoagulant: bivalirudin Anticoagulation target: PTT 60-80   Changes in support: Try to drop sweep incrementally, tolerate pH down to as low as 7.25    Anticipated goals/duration of support: wean to decannulation  [name] 06/23/2021 7:38 AM

## 2021-06-23 NOTE — Progress Notes (Signed)
Despite multiple sedation boluses and titration up on sedation, patient had strong accessory muscle use double triggering vent; this was alleviated minimally by filter change and switching to PS.  Increased sweep to 10LPM and this paradoxical breathing pattern stopped as did his respiratory drive.  Will discuss with oncoming physician Dr. Chestine Spore; either we need to paralyze to sweep wean or we leave at higher sweeps until we remove more fluid (which is only reversible thing left to target).  Myrla Halsted MD PCCM

## 2021-06-23 NOTE — Progress Notes (Signed)
ANTICOAGULATION CONSULT NOTE   Pharmacy Consult for Bivalirudin Indication:  ECMO and atrial fibrillation  Allergies  Allergen Reactions   Bactrim [Sulfamethoxazole-Trimethoprim] Hives    Patient Measurements: Height: 5\' 9"  (175.3 cm) Weight: 116 kg (255 lb 11.7 oz) IBW/kg (Calculated) : 70.7  Vital Signs: Temp: 98.6 F (37 C) (11/13 1230) Temp Source: Axillary (11/13 1230)  Labs: Recent Labs    06/21/21 0411 06/21/21 0803 06/22/21 0357 06/22/21 0808 06/22/21 1631 06/22/21 1710 06/22/21 1941 06/23/21 0401 06/23/21 0408 06/23/21 1443 06/23/21 1608 06/23/21 1625  HGB 8.6*   < > 8.4*  7.8*   < > 8.4*  --    < > 8.0*   < > 7.8* 7.5* 7.9*  HCT 30.5*   < > 28.2*  23.0*   < > 29.1*  --    < > 26.5*   < > 23.0* 22.0* 26.4*  PLT 125*   < > 136*  --  147*  --   --  147*  --   --   --  146*  APTT 61*   < > 62*  --   --  65*  --  65*  --   --   --  65*  LABPROT 21.7*  --  22.4*  --   --   --   --  21.5*  --   --   --   --   INR 1.9*  --  2.0*  --   --   --   --  1.9*  --   --   --   --   CREATININE 1.27*   < > 1.31*  --  1.43*  --   --  1.43*  --   --   --  1.33*   < > = values in this interval not displayed.     Estimated Creatinine Clearance: 78.8 mL/min (A) (by C-G formula based on SCr of 1.33 mg/dL (H)).   Medical History: Past Medical History:  Diagnosis Date   Anxiety    Atrial fibrillation (HCC)    CHF (congestive heart failure) (HCC)    COPD (chronic obstructive pulmonary disease) (HCC)    Emphysema [[name]]   Depression    Diabetes mellitus without complication (HCC)    type 2   Emphysema of lung (HCC)    GERD (gastroesophageal reflux disease)    HIV disease (HCC)    Hypertension    Oxygen deficiency     Assessment: 55 Y/O male on chronic apixaban for atrial fibrillation. Previous placed on heparin in anticipation of line placement and possible procedures.  Last apixaban dose given @0948  11/7. Patient cannulated and ECMO initiated 11/7; received a  heparin bolus at cannulation, then switched to bivalrudin infusion. Pharmacy to dose bivalirudin.  aPTT at goal and unchanged from last couple checks. No bleeding related issues noted. Hgb stable at 7.9. No pump issues.  Goal of Therapy:  aPTT 60-80 secs Monitor platelets by anticoagulation protocol: Yes   Plan:  Continue bivalirudin to 0.28 mg/kg/hr  Aptt and CBC q12hr   13/7 PharmD., BCPS Clinical Pharmacist 06/23/2021 5:58 PM   [facility] pharmacy phone numbers are listed on [email or web address]

## 2021-06-24 ENCOUNTER — Inpatient Hospital Stay (HOSPITAL_COMMUNITY): Payer: Medicare HMO

## 2021-06-24 DIAGNOSIS — Z9911 Dependence on respirator [ventilator] status: Secondary | ICD-10-CM | POA: Diagnosis not present

## 2021-06-24 DIAGNOSIS — J80 Acute respiratory distress syndrome: Secondary | ICD-10-CM

## 2021-06-24 DIAGNOSIS — J9621 Acute and chronic respiratory failure with hypoxia: Secondary | ICD-10-CM | POA: Diagnosis not present

## 2021-06-24 DIAGNOSIS — J9622 Acute and chronic respiratory failure with hypercapnia: Secondary | ICD-10-CM | POA: Diagnosis not present

## 2021-06-24 DIAGNOSIS — J101 Influenza due to other identified influenza virus with other respiratory manifestations: Secondary | ICD-10-CM | POA: Diagnosis not present

## 2021-06-24 LAB — POCT I-STAT 7, (LYTES, BLD GAS, ICA,H+H)
Acid-Base Excess: 1 mmol/L (ref 0.0–2.0)
Acid-Base Excess: 1 mmol/L (ref 0.0–2.0)
Acid-Base Excess: 1 mmol/L (ref 0.0–2.0)
Acid-Base Excess: 0 mmol/L (ref 0.0–2.0)
Acid-Base Excess: 0 mmol/L (ref 0.0–2.0)
Acid-Base Excess: 0 mmol/L (ref 0.0–2.0)
Acid-Base Excess: 1 mmol/L (ref 0.0–2.0)
Acid-Base Excess: 1 mmol/L (ref 0.0–2.0)
Acid-Base Excess: 2 mmol/L (ref 0.0–2.0)
Acid-Base Excess: 0 mmol/L (ref 0.0–2.0)
Acid-Base Excess: 1 mmol/L (ref 0.0–2.0)
Acid-Base Excess: 1 mmol/L (ref 0.0–2.0)
Acid-Base Excess: 2 mmol/L (ref 0.0–2.0)
Acid-Base Excess: 2 mmol/L (ref 0.0–2.0)
Acid-base deficit: 1 mmol/L (ref 0.0–2.0)
Bicarbonate: 26.9 mmol/L (ref 20.0–28.0)
Bicarbonate: 28.1 mmol/L — ABNORMAL HIGH (ref 20.0–28.0)
Bicarbonate: 26.8 mmol/L (ref 20.0–28.0)
Bicarbonate: 26.7 mmol/L (ref 20.0–28.0)
Bicarbonate: 25.9 mmol/L (ref 20.0–28.0)
Bicarbonate: 26.8 mmol/L (ref 20.0–28.0)
Bicarbonate: 27.2 mmol/L (ref 20.0–28.0)
Bicarbonate: 27.2 mmol/L (ref 20.0–28.0)
Bicarbonate: 29.1 mmol/L — ABNORMAL HIGH (ref 20.0–28.0)
Bicarbonate: 27.1 mmol/L (ref 20.0–28.0)
Bicarbonate: 26.1 mmol/L (ref 20.0–28.0)
Bicarbonate: 26.6 mmol/L (ref 20.0–28.0)
Bicarbonate: 28 mmol/L (ref 20.0–28.0)
Bicarbonate: 27.8 mmol/L (ref 20.0–28.0)
Bicarbonate: 27.5 mmol/L (ref 20.0–28.0)
Calcium, Ion: 1.24 mmol/L (ref 1.15–1.40)
Calcium, Ion: 1.27 mmol/L (ref 1.15–1.40)
Calcium, Ion: 1.24 mmol/L (ref 1.15–1.40)
Calcium, Ion: 1.23 mmol/L (ref 1.15–1.40)
Calcium, Ion: 1.21 mmol/L (ref 1.15–1.40)
Calcium, Ion: 1.23 mmol/L (ref 1.15–1.40)
Calcium, Ion: 1.22 mmol/L (ref 1.15–1.40)
Calcium, Ion: 1.21 mmol/L (ref 1.15–1.40)
Calcium, Ion: 1.21 mmol/L (ref 1.15–1.40)
Calcium, Ion: 1.21 mmol/L (ref 1.15–1.40)
Calcium, Ion: 1.19 mmol/L (ref 1.15–1.40)
Calcium, Ion: 1.2 mmol/L (ref 1.15–1.40)
Calcium, Ion: 1.21 mmol/L (ref 1.15–1.40)
Calcium, Ion: 1.2 mmol/L (ref 1.15–1.40)
Calcium, Ion: 1.2 mmol/L (ref 1.15–1.40)
HCT: 26 % — ABNORMAL LOW (ref 39.0–52.0)
HCT: 29 % — ABNORMAL LOW (ref 39.0–52.0)
HCT: 27 % — ABNORMAL LOW (ref 39.0–52.0)
HCT: 28 % — ABNORMAL LOW (ref 39.0–52.0)
HCT: 27 % — ABNORMAL LOW (ref 39.0–52.0)
HCT: 27 % — ABNORMAL LOW (ref 39.0–52.0)
HCT: 28 % — ABNORMAL LOW (ref 39.0–52.0)
HCT: 26 % — ABNORMAL LOW (ref 39.0–52.0)
HCT: 26 % — ABNORMAL LOW (ref 39.0–52.0)
HCT: 25 % — ABNORMAL LOW (ref 39.0–52.0)
HCT: 25 % — ABNORMAL LOW (ref 39.0–52.0)
HCT: 26 % — ABNORMAL LOW (ref 39.0–52.0)
HCT: 27 % — ABNORMAL LOW (ref 39.0–52.0)
HCT: 23 % — ABNORMAL LOW (ref 39.0–52.0)
HCT: 23 % — ABNORMAL LOW (ref 39.0–52.0)
Hemoglobin: 8.8 g/dL — ABNORMAL LOW (ref 13.0–17.0)
Hemoglobin: 9.9 g/dL — ABNORMAL LOW (ref 13.0–17.0)
Hemoglobin: 9.2 g/dL — ABNORMAL LOW (ref 13.0–17.0)
Hemoglobin: 9.5 g/dL — ABNORMAL LOW (ref 13.0–17.0)
Hemoglobin: 9.2 g/dL — ABNORMAL LOW (ref 13.0–17.0)
Hemoglobin: 9.2 g/dL — ABNORMAL LOW (ref 13.0–17.0)
Hemoglobin: 9.5 g/dL — ABNORMAL LOW (ref 13.0–17.0)
Hemoglobin: 8.8 g/dL — ABNORMAL LOW (ref 13.0–17.0)
Hemoglobin: 8.8 g/dL — ABNORMAL LOW (ref 13.0–17.0)
Hemoglobin: 8.5 g/dL — ABNORMAL LOW (ref 13.0–17.0)
Hemoglobin: 8.5 g/dL — ABNORMAL LOW (ref 13.0–17.0)
Hemoglobin: 8.8 g/dL — ABNORMAL LOW (ref 13.0–17.0)
Hemoglobin: 9.2 g/dL — ABNORMAL LOW (ref 13.0–17.0)
Hemoglobin: 7.8 g/dL — ABNORMAL LOW (ref 13.0–17.0)
Hemoglobin: 7.8 g/dL — ABNORMAL LOW (ref 13.0–17.0)
O2 Saturation: 85 %
O2 Saturation: 96 %
O2 Saturation: 87 %
O2 Saturation: 92 %
O2 Saturation: 93 %
O2 Saturation: 95 %
O2 Saturation: 93 %
O2 Saturation: 93 %
O2 Saturation: 95 %
O2 Saturation: 89 %
O2 Saturation: 90 %
O2 Saturation: 91 %
O2 Saturation: 92 %
O2 Saturation: 93 %
O2 Saturation: 96 %
Patient temperature: 100
Patient temperature: 99.7
Patient temperature: 99.1
Patient temperature: 98.6
Patient temperature: 36.9
Patient temperature: 37
Patient temperature: 36.9
Patient temperature: 98
Patient temperature: 36.9
Patient temperature: 36.9
Patient temperature: 36.7
Patient temperature: 36.9
Patient temperature: 36.9
Patient temperature: 36.8
Patient temperature: 36.9
Potassium: 4.4 mmol/L (ref 3.5–5.1)
Potassium: 4.5 mmol/L (ref 3.5–5.1)
Potassium: 4.5 mmol/L (ref 3.5–5.1)
Potassium: 4.5 mmol/L (ref 3.5–5.1)
Potassium: 4.3 mmol/L (ref 3.5–5.1)
Potassium: 4.3 mmol/L (ref 3.5–5.1)
Potassium: 4.2 mmol/L (ref 3.5–5.1)
Potassium: 4.4 mmol/L (ref 3.5–5.1)
Potassium: 4.7 mmol/L (ref 3.5–5.1)
Potassium: 4.9 mmol/L (ref 3.5–5.1)
Potassium: 4.9 mmol/L (ref 3.5–5.1)
Potassium: 5 mmol/L (ref 3.5–5.1)
Potassium: 5 mmol/L (ref 3.5–5.1)
Potassium: 4.6 mmol/L (ref 3.5–5.1)
Potassium: 4.5 mmol/L (ref 3.5–5.1)
Sodium: 149 mmol/L — ABNORMAL HIGH (ref 135–145)
Sodium: 150 mmol/L — ABNORMAL HIGH (ref 135–145)
Sodium: 149 mmol/L — ABNORMAL HIGH (ref 135–145)
Sodium: 148 mmol/L — ABNORMAL HIGH (ref 135–145)
Sodium: 147 mmol/L — ABNORMAL HIGH (ref 135–145)
Sodium: 148 mmol/L — ABNORMAL HIGH (ref 135–145)
Sodium: 148 mmol/L — ABNORMAL HIGH (ref 135–145)
Sodium: 147 mmol/L — ABNORMAL HIGH (ref 135–145)
Sodium: 145 mmol/L (ref 135–145)
Sodium: 146 mmol/L — ABNORMAL HIGH (ref 135–145)
Sodium: 145 mmol/L (ref 135–145)
Sodium: 146 mmol/L — ABNORMAL HIGH (ref 135–145)
Sodium: 146 mmol/L — ABNORMAL HIGH (ref 135–145)
Sodium: 146 mmol/L — ABNORMAL HIGH (ref 135–145)
Sodium: 146 mmol/L — ABNORMAL HIGH (ref 135–145)
TCO2: 28 mmol/L (ref 22–32)
TCO2: 30 mmol/L (ref 22–32)
TCO2: 28 mmol/L (ref 22–32)
TCO2: 28 mmol/L (ref 22–32)
TCO2: 27 mmol/L (ref 22–32)
TCO2: 28 mmol/L (ref 22–32)
TCO2: 29 mmol/L (ref 22–32)
TCO2: 29 mmol/L (ref 22–32)
TCO2: 31 mmol/L (ref 22–32)
TCO2: 29 mmol/L (ref 22–32)
TCO2: 27 mmol/L (ref 22–32)
TCO2: 28 mmol/L (ref 22–32)
TCO2: 30 mmol/L (ref 22–32)
TCO2: 29 mmol/L (ref 22–32)
TCO2: 29 mmol/L (ref 22–32)
pCO2 arterial: 49.8 mmHg — ABNORMAL HIGH (ref 32.0–48.0)
pCO2 arterial: 60.8 mmHg — ABNORMAL HIGH (ref 32.0–48.0)
pCO2 arterial: 49.9 mmHg — ABNORMAL HIGH (ref 32.0–48.0)
pCO2 arterial: 49.8 mmHg — ABNORMAL HIGH (ref 32.0–48.0)
pCO2 arterial: 48.4 mmHg — ABNORMAL HIGH (ref 32.0–48.0)
pCO2 arterial: 50.8 mmHg — ABNORMAL HIGH (ref 32.0–48.0)
pCO2 arterial: 52.7 mmHg — ABNORMAL HIGH (ref 32.0–48.0)
pCO2 arterial: 49.5 mmHg — ABNORMAL HIGH (ref 32.0–48.0)
pCO2 arterial: 55.9 mmHg — ABNORMAL HIGH (ref 32.0–48.0)
pCO2 arterial: 54.4 mmHg — ABNORMAL HIGH (ref 32.0–48.0)
pCO2 arterial: 44.9 mmHg (ref 32.0–48.0)
pCO2 arterial: 56.1 mmHg — ABNORMAL HIGH (ref 32.0–48.0)
pCO2 arterial: 56.6 mmHg — ABNORMAL HIGH (ref 32.0–48.0)
pCO2 arterial: 49 mmHg — ABNORMAL HIGH (ref 32.0–48.0)
pCO2 arterial: 49.5 mmHg — ABNORMAL HIGH (ref 32.0–48.0)
pH, Arterial: 7.276 — ABNORMAL LOW (ref 7.350–7.450)
pH, Arterial: 7.344 — ABNORMAL LOW (ref 7.350–7.450)
pH, Arterial: 7.34 — ABNORMAL LOW (ref 7.350–7.450)
pH, Arterial: 7.336 — ABNORMAL LOW (ref 7.350–7.450)
pH, Arterial: 7.329 — ABNORMAL LOW (ref 7.350–7.450)
pH, Arterial: 7.336 — ABNORMAL LOW (ref 7.350–7.450)
pH, Arterial: 7.321 — ABNORMAL LOW (ref 7.350–7.450)
pH, Arterial: 7.346 — ABNORMAL LOW (ref 7.350–7.450)
pH, Arterial: 7.324 — ABNORMAL LOW (ref 7.350–7.450)
pH, Arterial: 7.304 — ABNORMAL LOW (ref 7.350–7.450)
pH, Arterial: 7.284 — ABNORMAL LOW (ref 7.350–7.450)
pH, Arterial: 7.301 — ABNORMAL LOW (ref 7.350–7.450)
pH, Arterial: 7.371 (ref 7.350–7.450)
pH, Arterial: 7.36 (ref 7.350–7.450)
pH, Arterial: 7.352 (ref 7.350–7.450)
pO2, Arterial: 60 mmHg — ABNORMAL LOW (ref 83.0–108.0)
pO2, Arterial: 90 mmHg (ref 83.0–108.0)
pO2, Arterial: 58 mmHg — ABNORMAL LOW (ref 83.0–108.0)
pO2, Arterial: 69 mmHg — ABNORMAL LOW (ref 83.0–108.0)
pO2, Arterial: 74 mmHg — ABNORMAL LOW (ref 83.0–108.0)
pO2, Arterial: 81 mmHg — ABNORMAL LOW (ref 83.0–108.0)
pO2, Arterial: 73 mmHg — ABNORMAL LOW (ref 83.0–108.0)
pO2, Arterial: 72 mmHg — ABNORMAL LOW (ref 83.0–108.0)
pO2, Arterial: 81 mmHg — ABNORMAL LOW (ref 83.0–108.0)
pO2, Arterial: 63 mmHg — ABNORMAL LOW (ref 83.0–108.0)
pO2, Arterial: 66 mmHg — ABNORMAL LOW (ref 83.0–108.0)
pO2, Arterial: 67 mmHg — ABNORMAL LOW (ref 83.0–108.0)
pO2, Arterial: 70 mmHg — ABNORMAL LOW (ref 83.0–108.0)
pO2, Arterial: 68 mmHg — ABNORMAL LOW (ref 83.0–108.0)
pO2, Arterial: 84 mmHg (ref 83.0–108.0)

## 2021-06-24 LAB — GLUCOSE, CAPILLARY
Glucose-Capillary: 175 mg/dL — ABNORMAL HIGH (ref 70–99)
Glucose-Capillary: 130 mg/dL — ABNORMAL HIGH (ref 70–99)
Glucose-Capillary: 130 mg/dL — ABNORMAL HIGH (ref 70–99)
Glucose-Capillary: 135 mg/dL — ABNORMAL HIGH (ref 70–99)
Glucose-Capillary: 131 mg/dL — ABNORMAL HIGH (ref 70–99)
Glucose-Capillary: 131 mg/dL — ABNORMAL HIGH (ref 70–99)
Glucose-Capillary: 141 mg/dL — ABNORMAL HIGH (ref 70–99)
Glucose-Capillary: 147 mg/dL — ABNORMAL HIGH (ref 70–99)
Glucose-Capillary: 171 mg/dL — ABNORMAL HIGH (ref 70–99)
Glucose-Capillary: 192 mg/dL — ABNORMAL HIGH (ref 70–99)
Glucose-Capillary: 202 mg/dL — ABNORMAL HIGH (ref 70–99)
Glucose-Capillary: 200 mg/dL — ABNORMAL HIGH (ref 70–99)
Glucose-Capillary: 222 mg/dL — ABNORMAL HIGH (ref 70–99)
Glucose-Capillary: 216 mg/dL — ABNORMAL HIGH (ref 70–99)
Glucose-Capillary: 199 mg/dL — ABNORMAL HIGH (ref 70–99)
Glucose-Capillary: 171 mg/dL — ABNORMAL HIGH (ref 70–99)
Glucose-Capillary: 172 mg/dL — ABNORMAL HIGH (ref 70–99)
Glucose-Capillary: 174 mg/dL — ABNORMAL HIGH (ref 70–99)
Glucose-Capillary: 179 mg/dL — ABNORMAL HIGH (ref 70–99)
Glucose-Capillary: 182 mg/dL — ABNORMAL HIGH (ref 70–99)

## 2021-06-24 LAB — CBC
HCT: 31.3 % — ABNORMAL LOW (ref 39.0–52.0)
HCT: 30.1 % — ABNORMAL LOW (ref 39.0–52.0)
Hemoglobin: 9.2 g/dL — ABNORMAL LOW (ref 13.0–17.0)
Hemoglobin: 8.8 g/dL — ABNORMAL LOW (ref 13.0–17.0)
MCH: 28.9 pg (ref 26.0–34.0)
MCH: 28.9 pg (ref 26.0–34.0)
MCHC: 29.4 g/dL — ABNORMAL LOW (ref 30.0–36.0)
MCHC: 29.2 g/dL — ABNORMAL LOW (ref 30.0–36.0)
MCV: 98.4 fL (ref 80.0–100.0)
MCV: 99 fL (ref 80.0–100.0)
Platelets: 149 10*3/uL — ABNORMAL LOW (ref 150–400)
Platelets: 161 10*3/uL (ref 150–400)
RBC: 3.18 MIL/uL — ABNORMAL LOW (ref 4.22–5.81)
RBC: 3.04 MIL/uL — ABNORMAL LOW (ref 4.22–5.81)
RDW: 15.5 % (ref 11.5–15.5)
RDW: 15.4 % (ref 11.5–15.5)
WBC: 19.2 10*3/uL — ABNORMAL HIGH (ref 4.0–10.5)
WBC: 20.3 10*3/uL — ABNORMAL HIGH (ref 4.0–10.5)
nRBC: 2.3 % — ABNORMAL HIGH (ref 0.0–0.2)
nRBC: 1.4 % — ABNORMAL HIGH (ref 0.0–0.2)

## 2021-06-24 LAB — URINALYSIS, ROUTINE W REFLEX MICROSCOPIC
Bilirubin Urine: NEGATIVE
Glucose, UA: NEGATIVE mg/dL
Ketones, ur: NEGATIVE mg/dL
Leukocytes,Ua: NEGATIVE
Nitrite: NEGATIVE
Protein, ur: NEGATIVE mg/dL
Specific Gravity, Urine: 1.013 (ref 1.005–1.030)
pH: 5 (ref 5.0–8.0)

## 2021-06-24 LAB — BASIC METABOLIC PANEL
Anion gap: 7 (ref 5–15)
Anion gap: 9 (ref 5–15)
BUN: 74 mg/dL — ABNORMAL HIGH (ref 6–20)
BUN: 88 mg/dL — ABNORMAL HIGH (ref 6–20)
CO2: 26 mmol/L (ref 22–32)
CO2: 25 mmol/L (ref 22–32)
Calcium: 8.3 mg/dL — ABNORMAL LOW (ref 8.9–10.3)
Calcium: 8.2 mg/dL — ABNORMAL LOW (ref 8.9–10.3)
Chloride: 113 mmol/L — ABNORMAL HIGH (ref 98–111)
Chloride: 110 mmol/L (ref 98–111)
Creatinine, Ser: 1.64 mg/dL — ABNORMAL HIGH (ref 0.61–1.24)
Creatinine, Ser: 1.83 mg/dL — ABNORMAL HIGH (ref 0.61–1.24)
GFR, Estimated: 49 mL/min — ABNORMAL LOW (ref >60)
GFR, Estimated: 43 mL/min — ABNORMAL LOW (ref >60)
Glucose, Bld: 145 mg/dL — ABNORMAL HIGH (ref 70–99)
Glucose, Bld: 223 mg/dL — ABNORMAL HIGH (ref 70–99)
Potassium: 4.5 mmol/L (ref 3.5–5.1)
Potassium: 5 mmol/L (ref 3.5–5.1)
Sodium: 146 mmol/L — ABNORMAL HIGH (ref 135–145)
Sodium: 144 mmol/L (ref 135–145)

## 2021-06-24 LAB — LACTATE DEHYDROGENASE: LDH: 407 U/L — ABNORMAL HIGH (ref 98–192)

## 2021-06-24 LAB — HEPATIC FUNCTION PANEL
ALT: 126 U/L — ABNORMAL HIGH (ref 0–44)
AST: 123 U/L — ABNORMAL HIGH (ref 15–41)
Albumin: 1.9 g/dL — ABNORMAL LOW (ref 3.5–5.0)
Alkaline Phosphatase: 46 U/L (ref 38–126)
Bilirubin, Direct: 0.3 mg/dL — ABNORMAL HIGH (ref 0.0–0.2)
Indirect Bilirubin: 0.6 mg/dL (ref 0.3–0.9)
Total Bilirubin: 0.9 mg/dL (ref 0.3–1.2)
Total Protein: 6.5 g/dL (ref 6.5–8.1)

## 2021-06-24 LAB — APTT
aPTT: 80 seconds — ABNORMAL HIGH (ref 24–36)
aPTT: 83 seconds — ABNORMAL HIGH (ref 24–36)

## 2021-06-24 LAB — LACTIC ACID, PLASMA
Lactic Acid, Venous: 0.7 mmol/L (ref 0.5–1.9)
Lactic Acid, Venous: 1 mmol/L (ref 0.5–1.9)

## 2021-06-24 LAB — FIBRINOGEN: Fibrinogen: >800 mg/dL — ABNORMAL HIGH (ref 210–475)

## 2021-06-24 LAB — PROTIME-INR
INR: 2.1 — ABNORMAL HIGH (ref 0.8–1.2)
Prothrombin Time: 23.4 seconds — ABNORMAL HIGH (ref 11.4–15.2)

## 2021-06-24 LAB — MRSA NEXT GEN BY PCR, NASAL: MRSA by PCR Next Gen: NOT DETECTED

## 2021-06-24 MED ORDER — METHYLPREDNISOLONE SODIUM SUCC 40 MG IJ SOLR
40.0000 mg | Freq: Every day | INTRAMUSCULAR | Status: DC
  Administered 2021-06-24 – 2021-06-27 (×4): 40 mg via INTRAVENOUS
  Filled 2021-06-24 (×4): qty 1

## 2021-06-24 MED ORDER — FREE WATER
200.0000 mL | Status: DC
  Administered 2021-06-24 – 2021-06-25 (×7): 200 mL

## 2021-06-24 MED ORDER — FUROSEMIDE 10 MG/ML IJ SOLN
60.0000 mg | Freq: Three times a day (TID) | INTRAMUSCULAR | Status: AC
  Administered 2021-06-24 (×3): 60 mg via INTRAVENOUS
  Filled 2021-06-24 (×3): qty 6

## 2021-06-24 MED ORDER — AMIODARONE LOAD VIA INFUSION
150.0000 mg | Freq: Once | INTRAVENOUS | Status: AC
  Administered 2021-06-24: 150 mg via INTRAVENOUS
  Filled 2021-06-24: qty 83.34

## 2021-06-24 NOTE — Progress Notes (Signed)
OT Cancellation Note  Patient Details Name: Chris [name]. MRN: [id number] DOB: 10-04-1965   Cancelled Treatment:    Reason Eval/Treat Not Completed: Patient not medically ready (Continues to be agitated, requiring increased sedation.)  [name] 06/24/2021, 10:22 AM [name], OTR/L Acute Rehabilitation Services Pager: [phone] Office: [phone]

## 2021-06-24 NOTE — Progress Notes (Signed)
RT assisted CCM in trach change to #8 XLT distal trach.

## 2021-06-24 NOTE — Progress Notes (Signed)
ANTICOAGULATION CONSULT NOTE   Pharmacy Consult for Bivalirudin Indication:  ECMO and atrial fibrillation  Allergies  Allergen Reactions   Bactrim [Sulfamethoxazole-Trimethoprim] Hives    Patient Measurements: Height: 5\' 9"  (175.3 cm) Weight: 116 kg (255 lb 11.7 oz) IBW/kg (Calculated) : 70.7  Vital Signs: Temp: 100 F (37.8 C) (11/14 0100) Temp Source: (P) Axillary (11/14 0400)  Labs: Recent Labs    06/22/21 0357 06/22/21 0808 06/23/21 0401 06/23/21 0408 06/23/21 1625 06/23/21 1930 06/24/21 0110 06/24/21 0319 06/24/21 0323  HGB 8.4*  7.8*   < > 8.0*   < > 7.9*   < > 8.8* 9.2* 9.2*  HCT 28.2*  23.0*   < > 26.5*   < > 26.4*   < > 26.0* 31.3* 27.0*  PLT 136*   < > 147*  --  146*  --   --  149*  --   APTT 62*   < > 65*  --  65*  --   --  80*  --   LABPROT 22.4*  --  21.5*  --   --   --   --  23.4*  --   INR 2.0*  --  1.9*  --   --   --   --  2.1*  --   CREATININE 1.31*   < > 1.43*  --  1.33*  --   --  1.64*  --    < > = values in this interval not displayed.     Estimated Creatinine Clearance: 63.9 mL/min (A) (by C-G formula based on SCr of 1.64 mg/dL (H)).   Medical History: Past Medical History:  Diagnosis Date   Anxiety    Atrial fibrillation (HCC)    CHF (congestive heart failure) (HCC)    COPD (chronic obstructive pulmonary disease) (HCC)    Emphysema [[name]]   Depression    Diabetes mellitus without complication (HCC)    type 2   Emphysema of lung (HCC)    GERD (gastroesophageal reflux disease)    HIV disease (HCC)    Hypertension    Oxygen deficiency     Assessment: 55 Y/O male on chronic apixaban for atrial fibrillation. Previous placed on heparin in anticipation of line placement and possible procedures.  Last apixaban dose given @0948  11/7. Patient cannulated and ECMO initiated 11/7; received a heparin bolus at cannulation, then switched to bivalrudin infusion. Pharmacy to dose bivalirudin.  aPTT of 80 sec at upper end of goal on 0.28  mg/kg/hr. No bleeding related issues noted. Hgb up at 9.2 (admission has been 7-8s).  No pump issues.  Goal of Therapy:  aPTT 60-80 secs Monitor platelets by anticoagulation protocol: Yes   Plan:  Continue bivalirudin to 0.28 mg/kg/hr  Aptt and CBC q12hr   13/7, PharmD PGY1 Pharmacy Resident 06/24/2021  5:05 AM  Please check [email or web address] for unit-specific pharmacy phone numbers.

## 2021-06-24 NOTE — Progress Notes (Signed)
RT assisted CCM in trach exchange via bougie. No complications noted.

## 2021-06-24 NOTE — Progress Notes (Signed)
Patient ID: Chris Rivers., male   DOB: Feb 22, 1966, 55 y.o.   MRN: [id number]  Extracorporeal support note     ECLS support day: 7 Indication: Severe COPD with influenza A   Configuration: VV ECMO, Crescent catheter right IJ   Drainage cannula: Right IJ Crescent Return cannula: Right IJ Crescent   Pump speed: 3600 Pump flow: 4.71 Pump used: Cardiohelp   Sweep gas: 6   Circuit check: Small area of thrombus 3 o'clock  Anticoagulant: bivalirudin Anticoagulation target: PTT 60-80   Changes in support: Try to drop sweep incrementally, tolerate pH down to as low as 7.25    Anticipated goals/duration of support: wean to decannulation  Chris [name] 06/24/2021 7:22 AM

## 2021-06-24 NOTE — Progress Notes (Signed)
Patient ID: Chris Round., male   DOB: 1966-03-25, 55 y.o.   MRN: [id number]     Advanced Heart Failure Rounding Note  PCP-Cardiologist: Glenetta Hew, MD   Subjective:    11/7: VV ECMO cannulation 11/10: Tracheostomy  This morning, patient is off NE with elevated BP in setting of agitation.  HR 100s in atrial fibrillation when calm, up to 140s when agitated.    Tm 100, on cefepime.  Trach aspirate from 11/7 with Pseudomonas and MSSA.  Oseltamivir for influenza completed. Now off Solumedrol.  AKI with creatinine 2.37 => 1.85 => 1.63 => 1.27 => 1.31 => 1.43 => 1.64.  Good UOP but I/Os still positive.   FiO2 0.45on vent, Vt 460 cc. Sweep up to 6 today.    Patient wakes up with sedation wean, agitated.   TFs ongoing.   ECMO: 3600 rpm Flow 4.71 Pvenous -88 DeltaP 31 Sweep 6 ABG 7.34/50/69/92% PTT 80 (goal 60-80), on bivalirudin Lactate 0.7 LDH 323 => 356 => 310 => 278 => 262 => 300 => 407  Na 146, on free H20 boluses 300 q4hrs  CXR: Left effusion + atelectasis, stable cannula position   Objective:   Weight Range: 119.6 kg Body mass index is 38.94 kg/m.   Vital Signs:   Temp:  [98.5 F (36.9 C)-100 F (37.8 C)] 98.6 F (37 C) (11/14 0430) Resp:  [16-36] 25 (11/14 0700) SpO2:  [84 %-100 %] 96 % (11/14 0700) Arterial Line BP: (74-177)/(43-74) 174/64 (11/14 0700) FiO2 (%):  [40 %-50 %] 50 % (11/14 0301) Weight:  [119.6 kg] 119.6 kg (11/14 0530) Last BM Date: 06/23/21  Weight change: Filed Weights   06/21/21 0222 06/22/21 0500 06/24/21 0530  Weight: 114 kg 116 kg 119.6 kg    Intake/Output:   Intake/Output Summary (Last 24 hours) at 06/24/2021 0723 Last data filed at 06/24/2021 0700 Gross per 24 hour  Intake 10537.59 ml  Output 5245 ml  Net 5292.59 ml      Physical Exam    General: Awake, agitated Neck: RIJ [place], LIJ CVL, no thyromegaly or thyroid nodule.  Lungs: Crackles at bases.  CV: Nondisplaced PMI.  Heart mildly tachy, irregular S1/S2,  no S3/S4, no murmur.  Trace ankle edema.  Abdomen: Soft, nontender, no hepatosplenomegaly, mild distention.  Skin: Intact without lesions or rashes.  Neurologic: Agitated, awake.  Extremities: No clubbing or cyanosis.  HEENT: Normal.    Telemetry   Atrial fibrillation 110s (personally reviewed)  Labs    CBC Recent Labs    06/23/21 1625 06/23/21 1930 06/24/21 0319 06/24/21 0323 06/24/21 0636  WBC 12.8*  --  19.2*  --   --   HGB 7.9*   < > 9.2* 9.2* 9.5*  HCT 26.4*   < > 31.3* 27.0* 28.0*  MCV 98.9  --  98.4  --   --   PLT 146*  --  149*  --   --    < > = values in this interval not displayed.   Basic Metabolic Panel Recent Labs    06/23/21 0807 06/23/21 0825 06/23/21 1625 06/23/21 1930 06/24/21 0319 06/24/21 0323 06/24/21 0636  NA  --    < > 148*   < > 146* 149* 148*  K  --    < > 3.8   < > 4.5 4.5 4.5  CL  --   --  113*  --  113*  --   --   CO2  --   --  27  --  26  --   --   GLUCOSE  --   --  185*  --  145*  --   --   BUN  --   --  58*  --  74*  --   --   CREATININE  --   --  1.33*  --  1.64*  --   --   CALCIUM  --   --  8.6*  --  8.3*  --   --   MG 2.4  --   --   --   --   --   --    < > = values in this interval not displayed.   Liver Function Tests Recent Labs    06/23/21 0401 06/24/21 0319  AST 90* 123*  ALT 92* 126*  ALKPHOS 40 46  BILITOT 0.6 0.9  PROT 5.9* 6.5  ALBUMIN 1.7* 1.9*   No results for input(s): LIPASE, AMYLASE in the last 72 hours. Cardiac Enzymes No results for input(s): CKTOTAL, CKMB, CKMBINDEX, TROPONINI in the last 72 hours.  BNP: BNP (last 3 results) Recent Labs    10/29/20 1214  BNP 102.9*    ProBNP (last 3 results) No results for input(s): PROBNP in the last 8760 hours.   D-Dimer No results for input(s): DDIMER in the last 72 hours. Hemoglobin A1C No results for input(s): HGBA1C in the last 72 hours. Fasting Lipid Panel No results for input(s): CHOL, HDL, LDLCALC, TRIG, CHOLHDL, LDLDIRECT in the last 72  hours.  Thyroid Function Tests No results for input(s): TSH, T4TOTAL, T3FREE, THYROIDAB in the last 72 hours.  Invalid input(s): FREET3  Other results:   Imaging    DG Abd 1 View  Result Date: 06/23/2021 CLINICAL DATA:  Ileus evaluation. EXAM: ABDOMEN - 1 VIEW COMPARISON:  Radiograph 06/19/2021, 07/08/2021 FINDINGS: Weighted enteric tube tip in the proximal jejunum. Large-bore catheter in the chest extends below the diaphragm in the region of the IVC. No definite gaseous small bowel distension. There is air within mildly prominent transverse and descending colon. Small volume of stool in the rectum. Linear 2.2 cm density projecting over the pelvis just to the left of midline is of unknown significance and etiology. No evidence of free air. IMPRESSION: 1. Nonspecific bowel gas pattern with mildly prominent air within transverse and descending colon but no definite gaseous small bowel distension to suggest small bowel ileus. 2. A 2.2 cm linear density projecting over the pelvis just to the left of midline is of unknown significance and etiology. This may be external to the patient or related to a catheter. Recommend correlation with physical exam. Electronically Signed   By: Keith Rake M.D.   On: 06/23/2021 15:06     Medications:     Scheduled Medications:  arformoterol  15 mcg Nebulization BID   budesonide (PULMICORT) nebulizer solution  0.25 mg Nebulization BID   chlorhexidine gluconate (MEDLINE KIT)  15 mL Mouth Rinse BID   Chlorhexidine Gluconate Cloth  6 each Topical Daily   clonazePAM  1 mg Per Tube TID   emtricitabine-tenofovir AF  1 tablet Per Tube Daily   And   dolutegravir  50 mg Per Tube Daily   feeding supplement (PROSource TF)  90 mL Per Tube TID   free water  200 mL Per Tube Q4H   furosemide  60 mg Intravenous Q8H   insulin detemir  30 Units Subcutaneous BID   mouth rinse  15 mL Mouth Rinse 10 times per day   metolazone  2.5 mg Per Tube Daily   oxyCODONE  10 mg  Per Tube Q6H   pantoprazole (PROTONIX) IV  40 mg Intravenous QHS   polyethylene glycol  17 g Per Tube BID   QUEtiapine  100 mg Per Tube BID   revefenacin  175 mcg Nebulization Daily   rosuvastatin  20 mg Per Tube Daily   senna-docusate  1 tablet Per Tube BID   sodium chloride flush  10-40 mL Intracatheter Q12H   sodium chloride flush  3 mL Intravenous Q12H   valproic acid  250 mg Per Tube BID    Infusions:  sodium chloride     sodium chloride 10 mL/hr at 06/16/2021 1502   sodium chloride     sodium chloride Stopped (06/24/21 0657)   sodium chloride     amiodarone 60 mg/hr (06/24/21 0700)   bivalirudin (ANGIOMAX) infusion 0.5 mg/mL (Non-ACS indications) 0.28 mg/kg/hr (06/24/21 0700)   ceFEPime (MAXIPIME) IV Stopped (06/24/21 6387)   dexmedetomidine (PRECEDEX) IV infusion 1 mcg/kg/hr (06/24/21 0700)   feeding supplement (VITAL 1.5 CAL) 1,000 mL (06/24/21 0425)   fentaNYL infusion INTRAVENOUS 275 mcg/hr (06/24/21 0700)   insulin 3.4 Units/hr (06/24/21 0555)   norepinephrine (LEVOPHED) Adult infusion Stopped (06/24/21 0506)    PRN Medications: sodium chloride, Place/Maintain arterial line **AND** sodium chloride, acetaminophen **OR** acetaminophen, albuterol, bisacodyl, dextrose, docusate, fentaNYL, ondansetron **OR** ondansetron (ZOFRAN) IV, sodium chloride flush, sodium chloride flush   Assessment/Plan   1. Acute hypoxemic and hypercarbic respiratory failure: In setting of COPD on home oxygen at baseline.  He has severe influenza infection.  Refractory hypoxemia and hypercarbia with maximal management.  He has underlying significant lung disease but has reasonable quality of life and had ARDS in the past requiring tracheostomy but was able to wean off.  RESP score was acceptable.  VV ECMO cannulation on 11/7.  Pseudomonas and MSSA in trach aspirate from 11/7.  Tracheostomy 11/10.  This morning, lactate is 0.7.  ABG 7.34/50/69/92%.  Sweep at 6, unable to wean yesterday.  LDH mildly  higher at 400.  Good UOP with IV Lasix but net positive due to high input.  Need negative I/Os, volume status is one of the few variables we can actively change here.  - Continue current ECMO support, continue incremental/gradual sweep wean => accept pH as low as 7.25 if patient tolerates.  - Bivalirudin for PTT 60-80.  - Give Lasix 60 mg IV every 8 hrs today.  - Antibiotic coverage with cefepime - Off steroids, will discuss restarting with pulmonary.  - Anticipate prolonged course.  2. Atrial fibrillation: He has been in atrial fibrillation on all ECGs since 2021, suspect chronic at this point. On Eliquis at home. Soft BP has limited metoprolol.  - Will manage on bivalirudin.  - Decrease amiodarone gtt to 30 mg/hr.    - Consider cardioversion in future.  3.  AKI: Creatinine 1.6 => 2.37 => 1.85 => 1.63 => 1.27 => 1.31 => 1.43 => 1.64.   Urine looks clear but LDH is mildly elevated. - Lasix 60 mg IV every 8 hrs today.  4. Type 2 DM: SSI.  5. Influenza: Severe.  Completed oseltamivir.  6. COPD: Still smokes 1-2 cigs/day.  Intermittent home oxygen.  Followed by pulmonary, has been stable at home for years.  7. HIV: Last viral load was undetectable.  On treatment.  8. Shock: Suspect primarily septic shock.  Echo showed EF 55-60% with normal RV. Currently off NE. 9. FEN: Tube feeds.  10. Heme: hgb  9.2 today, had 2 units PRBCs 11/9. - Transfuse < 8  11. Hypernatremia: Na 146 today.  - Cut back free water to 200 cc every 4 hrs.    CRITICAL CARE Performed by: Loralie Champagne  Total critical care time: 40 minutes  Critical care time was exclusive of separately billable procedures and treating other patients.  Critical care was necessary to treat or prevent imminent or life-threatening deterioration.  Critical care was time spent personally by me on the following activities: development of treatment plan with patient and/or surrogate as well as nursing, discussions with consultants, evaluation  of patient's response to treatment, examination of patient, obtaining history from patient or surrogate, ordering and performing treatments and interventions, ordering and review of laboratory studies, ordering and review of radiographic studies, pulse oximetry and re-evaluation of patient's condition.     Length of Stay: [place], MD  06/24/2021, 7:23 AM  Advanced Heart Failure Team Pager [phone] (M-F; 7a - 5p)  Please contact [facility] for night-coverage after hours (5p -7a ) and weekends on [email or web address]

## 2021-06-24 NOTE — Progress Notes (Signed)
ANTICOAGULATION CONSULT NOTE   Pharmacy Consult for Bivalirudin Indication:  ECMO and atrial fibrillation  Allergies  Allergen Reactions   Bactrim [Sulfamethoxazole-Trimethoprim] Hives    Patient Measurements: Height: 5\' 9"  (175.3 cm) Weight: 119.6 kg (263 lb 10.7 oz) IBW/kg (Calculated) : 70.7  Vital Signs: Temp: 98.2 F (36.8 C) (11/14 1600) Temp Source: Core (11/14 1600) BP: 122/56 (11/14 1106) Pulse Rate: 75 (11/14 1600)  Labs: Recent Labs    06/22/21 0357 06/22/21 0808 06/23/21 0401 06/23/21 0408 06/23/21 1625 06/23/21 1930 06/24/21 0319 06/24/21 0323 06/24/21 1213 06/24/21 1412 06/24/21 1604  HGB 8.4*  7.8*   < > 8.0*   < > 7.9*   < > 9.2*   < > 8.8* 8.8* 8.8*  HCT 28.2*  23.0*   < > 26.5*   < > 26.4*   < > 31.3*   < > 26.0* 26.0* 30.1*  PLT 136*   < > 147*  --  146*  --  149*  --   --   --  161  APTT 62*   < > 65*  --  65*  --  80*  --   --   --  83*  LABPROT 22.4*  --  21.5*  --   --   --  23.4*  --   --   --   --   INR 2.0*  --  1.9*  --   --   --  2.1*  --   --   --   --   CREATININE 1.31*   < > 1.43*  --  1.33*  --  1.64*  --   --   --  1.83*   < > = values in this interval not displayed.     Estimated Creatinine Clearance: 58.3 mL/min (A) (by C-G formula based on SCr of 1.83 mg/dL (H)).   Medical History: Past Medical History:  Diagnosis Date   Anxiety    Atrial fibrillation (HCC)    CHF (congestive heart failure) (HCC)    COPD (chronic obstructive pulmonary disease) (HCC)    Emphysema [[name]]   Depression    Diabetes mellitus without complication (HCC)    type 2   Emphysema of lung (HCC)    GERD (gastroesophageal reflux disease)    HIV disease (HCC)    Hypertension    Oxygen deficiency     Assessment: 55 Y/O male on chronic apixaban for atrial fibrillation. Previous placed on heparin in anticipation of line placement and possible procedures.  Last apixaban dose given @0948  11/7. Patient cannulated and ECMO initiated 11/7; received a  heparin bolus at cannulation, then switched to bivalrudin infusion. Pharmacy to dose bivalirudin.  aPTT of 83 sec now above goal on 0.28 mg/kg/hr. No bleeding related issues noted. Hgb up at 9.2 (admission has been 7-8s).  No pump issues.  Goal of Therapy:  aPTT 60-80 secs Monitor platelets by anticoagulation protocol: Yes   Plan:  Reduce bivalirudin to 0.24 mg/kg/hr  Aptt and CBC q12hr   13/7 PharmD., BCPS Clinical Pharmacist 06/24/2021 5:42 PM

## 2021-06-24 NOTE — Progress Notes (Signed)
NAME:  Chris [name]., MRN:  [id number], DOB:  Jan 02, 1966, LOS: 70 ADMISSION DATE:  06/15/2021, CONSULTATION DATE:  06/15/2019 REFERRING MD:  Ruthann Cancer, CHIEF COMPLAINT:  hypercapnic respiratory failure   History of Present Illness:   55 year old man with O2 dependent COPD presenting with acute on chronic respiratory failure now on VV ECMO due to refractory hypoxia and hypercapnia.  Pertinent  Medical History   Past Medical History:  Diagnosis Date   Anxiety    Atrial fibrillation (HCC)    CHF (congestive heart failure) (HCC)    COPD (chronic obstructive pulmonary disease) ([place])    Emphysema [[name]]   Depression    Diabetes mellitus without complication ([place])    type 2   Emphysema of lung ([place])    GERD (gastroesophageal reflux disease)    HIV disease ([place])    Hypertension    Oxygen deficiency     Significant Hospital Events: Including procedures, antibiotic start and stop dates in addition to other pertinent events   11/7- VV ECMO started 11/9 - weaning sedation. Transfused 2 units PRBC to keep O2 carrying capacity.  11/9 - remains agitated with wake up  11/10 - tracheostomy tube placed  Interim History / Subjective:   No acute events overnight. Had severe agitation yesterday with sweep weaning.  Objective   Blood pressure (!) 125/57, pulse (!) 121, temperature 98.6 F (37 C), temperature source Axillary, resp. rate (!) 25, height 5\' 9"  (1.753 m), weight 119.6 kg, SpO2 96 %.    Vent Mode: PCV FiO2 (%):  [40 %-50 %] 50 % Set Rate:  [18 bmp] 18 bmp PEEP:  [10 cmH20] 10 cmH20 Plateau Pressure:  [24 cmH20-25 cmH20] 25 cmH20   Intake/Output Summary (Last 24 hours) at 06/24/2021 0711 Last data filed at 06/24/2021 0600 Gross per 24 hour  Intake 10226.07 ml  Output 5245 ml  Net 4981.07 ml    Filed Weights   06/21/21 0222 06/22/21 0500 06/24/21 0530  Weight: 114 kg 116 kg 119.6 kg    Examination: General: Critically ill appearing man lying in bed in NAD, trached  on MV, sedated, on ECMO HEENT: [place]/AT, eyes anicteric Neck: mild oozing around trach Resp: rhales bilaterally, very prolonged exhalation, Pplat 29, incomplete exhalation on vent waveforms. Using expiratory accessory muscles. Cardio: tachycardic, reg rhythm Neuro: RASS -4 Extremities: ongoing edema, improving some. No cyanosis. GU: external catheter Derm: warm, dry, no rashes.  Ancillary tests:   7.37/50/69/27 Na+  146 BUN 74 Cr 1.64 AST 123 ALT 126 LDH 407 WBC 19.2 H/H 9.2/31.3 Platelets 149 INR 2.1 PTT 80 BG 130-170s Resp culture 11/13> few PMN, rare GVR   Assessment & Plan:   Acute on chronic hypoxic and hypercapnic respiratory failure from influenza pneumonia on background of severe COPD, prior tracheostomy. On HOT 10 years. Acute COPD exacerbation; severe underlying COPD- 2019 PFTs: FEV1 1.14L (38%), RV 173%, DLCO 36% ARDS-  P:F 100 pre-cannulation Pseudomonas and MSSA HAP Now trach dependent Concern for undiagnosed OSA -Con't full vent support -Con't ECMO support; need to aggressively wean ECMO as his underlying lung disease is the predominant limitation here and we know he will have a very prolonged recovery from here. -AC with bivalirudin -completed tamiflu -14 days of antibiotics for Pseudomonas pneumonia -repeat trach aspirate pending -con't brovana, yupelri, pulmicort -restart steroids given severe obstruction limiting ECMO weaning -needs aggressive diuresis- lasxi TID & metolazone -Limited baseline functional status; active but requires frequent breaks.  Family prepared for prolonged course and need  for post-hospital rehabilitation.  Acute metabolic encephalopathy with severe agitation -con't seroquel, clonazepam, oxycodone, valproic acid -priority is ECMO weaning rather than rehabilitation on ECMO  HIV, undetectable VL -con't ART  Shock due to sedation -norepi to maintain MAP >65  Chronic Afib w/RVR -amiodarone -bival for AC -on eliquis and  diltiazem PTA  H/o HFoEF -optimize volume status with diuresis  GERD -PPI  Type 2 diabetes with hyperglycemia -con't levemir 30 units BID -con't insulin gtt another day since resuming steroids to calculate insulin requirements  Hypernatremia from high insensitive losses, improving -reduce FWF  Worsening leukocytosis -repeat sputum culture pending -con't cefepime -UA -repeat MRSA nares  Acute anemia -transfuse for Hb <7 or hemodynamically significant bleeding  Deconditioning, acute on chronic -prolonged recovery expected  Best Practice (right click and "Reselect all SmartList Selections" daily)   Diet/type: tubefeeds  DVT prophylaxis: bivalirudin GI prophylaxis: PPI Lines: Central line and Arterial Line Foley:  external catheter Code Status:  full code Last date of multidisciplinary goals of care discussion: continue for family updates    This patient is critically ill with multiple organ system failure which requires frequent high complexity decision making, assessment, support, evaluation, and titration of therapies. This was completed through the application of advanced monitoring technologies and extensive interpretation of multiple databases. During this encounter critical care time was devoted to patient care services described in this note for 60 minutes.  Steffanie Dunn, DO 06/24/21 9:20 AM [facility]

## 2021-06-24 NOTE — Procedures (Signed)
Tracheostomy Exchange Procedure Note  Chris [name]  [id number]  08-15-1965  Date:06/24/21  Time:5:08 PM   Provider Performing:[name] [name]   Procedure: Tracheostomy Exchange Through Immature Stoma (51761)  Indication(s) Failure to ventilate  Consent Unable to obtain consent due to emergent nature of procedure.  Anesthesia None   Time Out Verified patient identification, verified procedure, site/side was marked, verified correct patient position, special equipment/implants available, medications/allergies/relevant history reviewed, required imaging and test results available.   Sterile Technique Hand hygiene, gloves   Procedure Description Size 8 cuffed existing Shiley removed and size 8 cuffed Shiley placed through stoma. Since trach was only a few days old, the tracheostomy tube was exchanged over a bougie. CO2 color detector confirmed appropriate placement. Ventilated appropriately via MV. Trach secured in place with trach tie.   Complications/Tolerance None; patient tolerated the procedure well..   EBL Minimal  Steffanie Dunn, DO 06/24/21 5:11 PM [facility]

## 2021-06-24 NOTE — Progress Notes (Signed)
Palliative:  Discussed with Dr. Chestine Spore during ECMO rounds this morning. Plans for diureses and then attempt to wean ECMO support starting tomorrow. Ultimate goal to wean off ECMO this week. Reassess benefits vs limitations of ECMO support ~ Thursday.   I returned in the afternoon and Chris [name]'s trach is being changed out as cuff was blown. Wife has not come to bedside today but per nursing she plans to visit tomorrow. I will follow up with her tomorrow.   No charge  Yong Channel, NP Palliative Medicine Team Pager [phone] (Please see [email or web address] for schedule) Team Phone [phone]

## 2021-06-24 NOTE — Progress Notes (Signed)
PT Cancellation Note  Patient Details Name: Chris [name]. MRN: [id number] DOB: [birth year]   Cancelled Treatment:    Reason Eval/Treat Not Completed: Medical issues which prohibited therapy. Pt agitated when sedation weaned. Will continue to follow.   Angelina Ok [facility] 06/24/2021, 9:17 AM Skip Mayer PT Acute Rehabilitation Services Pager [phone] Office [phone]

## 2021-06-24 NOTE — Progress Notes (Signed)
Tracheostomy tube exchanged twice by Clark DO. Fentanyl boluses given throughout both procedures.

## 2021-06-24 NOTE — Procedures (Signed)
Tracheostomy Exchange Procedure Note  Dillin Lofgren  [id number]  May 09, 1966  Date:06/24/21  Time:6:06 PM   Provider Performing:Adalind Weitz [name]   Procedure: Tracheostomy Exchange Through Immature Stoma (37169)  Indication(s) Failure to ventilate  Consent Unable to obtain consent due to emergent nature of procedure.  Anesthesia None   Time Out Verified patient identification, verified procedure, site/side was marked, verified correct patient position, special equipment/implants available, medications/allergies/relevant history reviewed, required imaging and test results available. Vent alarming that there is a large cuff leak, audible leak on exam.    Sterile Technique Hand hygiene, gloves   Procedure Description Size 8 cuffed existing Shiley removed and size 8 distal XLT cuffed Shiley placed through stoma. ETCO2 color change. Bronch confirmed correct placement in trachea.   Complications/Tolerance None; patient tolerated the procedure well..   EBL Minimal  Steffanie Dunn, DO 06/24/21 6:06 PM [facility]

## 2021-06-25 ENCOUNTER — Inpatient Hospital Stay (HOSPITAL_COMMUNITY): Payer: Medicare HMO

## 2021-06-25 DIAGNOSIS — J9622 Acute and chronic respiratory failure with hypercapnia: Secondary | ICD-10-CM | POA: Diagnosis not present

## 2021-06-25 DIAGNOSIS — J441 Chronic obstructive pulmonary disease with (acute) exacerbation: Secondary | ICD-10-CM | POA: Diagnosis not present

## 2021-06-25 DIAGNOSIS — J9621 Acute and chronic respiratory failure with hypoxia: Secondary | ICD-10-CM | POA: Diagnosis not present

## 2021-06-25 DIAGNOSIS — Z7189 Other specified counseling: Secondary | ICD-10-CM | POA: Diagnosis not present

## 2021-06-25 DIAGNOSIS — J09X1 Influenza due to identified novel influenza A virus with pneumonia: Secondary | ICD-10-CM

## 2021-06-25 DIAGNOSIS — J101 Influenza due to other identified influenza virus with other respiratory manifestations: Secondary | ICD-10-CM | POA: Diagnosis not present

## 2021-06-25 DIAGNOSIS — Z515 Encounter for palliative care: Secondary | ICD-10-CM | POA: Diagnosis not present

## 2021-06-25 DIAGNOSIS — J9601 Acute respiratory failure with hypoxia: Secondary | ICD-10-CM | POA: Diagnosis not present

## 2021-06-25 DIAGNOSIS — J9602 Acute respiratory failure with hypercapnia: Secondary | ICD-10-CM

## 2021-06-25 LAB — POCT I-STAT 7, (LYTES, BLD GAS, ICA,H+H)
Acid-Base Excess: 3 mmol/L — ABNORMAL HIGH (ref 0.0–2.0)
Acid-Base Excess: 2 mmol/L (ref 0.0–2.0)
Acid-Base Excess: 2 mmol/L (ref 0.0–2.0)
Acid-Base Excess: 2 mmol/L (ref 0.0–2.0)
Acid-Base Excess: 1 mmol/L (ref 0.0–2.0)
Acid-Base Excess: 1 mmol/L (ref 0.0–2.0)
Acid-Base Excess: 0 mmol/L (ref 0.0–2.0)
Bicarbonate: 29.2 mmol/L — ABNORMAL HIGH (ref 20.0–28.0)
Bicarbonate: 28.4 mmol/L — ABNORMAL HIGH (ref 20.0–28.0)
Bicarbonate: 28.7 mmol/L — ABNORMAL HIGH (ref 20.0–28.0)
Bicarbonate: 28.5 mmol/L — ABNORMAL HIGH (ref 20.0–28.0)
Bicarbonate: 28.1 mmol/L — ABNORMAL HIGH (ref 20.0–28.0)
Bicarbonate: 28.1 mmol/L — ABNORMAL HIGH (ref 20.0–28.0)
Bicarbonate: 27.4 mmol/L (ref 20.0–28.0)
Calcium, Ion: 1.22 mmol/L (ref 1.15–1.40)
Calcium, Ion: 1.25 mmol/L (ref 1.15–1.40)
Calcium, Ion: 1.25 mmol/L (ref 1.15–1.40)
Calcium, Ion: 1.26 mmol/L (ref 1.15–1.40)
Calcium, Ion: 1.24 mmol/L (ref 1.15–1.40)
Calcium, Ion: 1.21 mmol/L (ref 1.15–1.40)
Calcium, Ion: 1.17 mmol/L (ref 1.15–1.40)
HCT: 23 % — ABNORMAL LOW (ref 39.0–52.0)
HCT: 23 % — ABNORMAL LOW (ref 39.0–52.0)
HCT: 22 % — ABNORMAL LOW (ref 39.0–52.0)
HCT: 22 % — ABNORMAL LOW (ref 39.0–52.0)
HCT: 23 % — ABNORMAL LOW (ref 39.0–52.0)
HCT: 22 % — ABNORMAL LOW (ref 39.0–52.0)
HCT: 23 % — ABNORMAL LOW (ref 39.0–52.0)
Hemoglobin: 7.8 g/dL — ABNORMAL LOW (ref 13.0–17.0)
Hemoglobin: 7.8 g/dL — ABNORMAL LOW (ref 13.0–17.0)
Hemoglobin: 7.5 g/dL — ABNORMAL LOW (ref 13.0–17.0)
Hemoglobin: 7.5 g/dL — ABNORMAL LOW (ref 13.0–17.0)
Hemoglobin: 7.8 g/dL — ABNORMAL LOW (ref 13.0–17.0)
Hemoglobin: 7.5 g/dL — ABNORMAL LOW (ref 13.0–17.0)
Hemoglobin: 7.8 g/dL — ABNORMAL LOW (ref 13.0–17.0)
O2 Saturation: 94 %
O2 Saturation: 95 %
O2 Saturation: 91 %
O2 Saturation: 93 %
O2 Saturation: 91 %
O2 Saturation: 91 %
O2 Saturation: 92 %
Patient temperature: 36.9
Patient temperature: 36.9
Patient temperature: 36.9
Patient temperature: 36.9
Patient temperature: 36.9
Patient temperature: 36.9
Patient temperature: 36.9
Potassium: 4.2 mmol/L (ref 3.5–5.1)
Potassium: 4 mmol/L (ref 3.5–5.1)
Potassium: 3.9 mmol/L (ref 3.5–5.1)
Potassium: 3.9 mmol/L (ref 3.5–5.1)
Potassium: 5 mmol/L (ref 3.5–5.1)
Potassium: 5.5 mmol/L — ABNORMAL HIGH (ref 3.5–5.1)
Potassium: 5.1 mmol/L (ref 3.5–5.1)
Sodium: 148 mmol/L — ABNORMAL HIGH (ref 135–145)
Sodium: 148 mmol/L — ABNORMAL HIGH (ref 135–145)
Sodium: 148 mmol/L — ABNORMAL HIGH (ref 135–145)
Sodium: 147 mmol/L — ABNORMAL HIGH (ref 135–145)
Sodium: 145 mmol/L (ref 135–145)
Sodium: 144 mmol/L (ref 135–145)
Sodium: 145 mmol/L (ref 135–145)
TCO2: 31 mmol/L (ref 22–32)
TCO2: 30 mmol/L (ref 22–32)
TCO2: 31 mmol/L (ref 22–32)
TCO2: 30 mmol/L (ref 22–32)
TCO2: 30 mmol/L (ref 22–32)
TCO2: 30 mmol/L (ref 22–32)
TCO2: 29 mmol/L (ref 22–32)
pCO2 arterial: 52.5 mmHg — ABNORMAL HIGH (ref 32.0–48.0)
pCO2 arterial: 53.8 mmHg — ABNORMAL HIGH (ref 32.0–48.0)
pCO2 arterial: 59.2 mmHg — ABNORMAL HIGH (ref 32.0–48.0)
pCO2 arterial: 55.4 mmHg — ABNORMAL HIGH (ref 32.0–48.0)
pCO2 arterial: 57.5 mmHg — ABNORMAL HIGH (ref 32.0–48.0)
pCO2 arterial: 57.8 mmHg — ABNORMAL HIGH (ref 32.0–48.0)
pCO2 arterial: 57.7 mmHg — ABNORMAL HIGH (ref 32.0–48.0)
pH, Arterial: 7.353 (ref 7.350–7.450)
pH, Arterial: 7.33 — ABNORMAL LOW (ref 7.350–7.450)
pH, Arterial: 7.294 — ABNORMAL LOW (ref 7.350–7.450)
pH, Arterial: 7.319 — ABNORMAL LOW (ref 7.350–7.450)
pH, Arterial: 7.296 — ABNORMAL LOW (ref 7.350–7.450)
pH, Arterial: 7.294 — ABNORMAL LOW (ref 7.350–7.450)
pH, Arterial: 7.284 — ABNORMAL LOW (ref 7.350–7.450)
pO2, Arterial: 73 mmHg — ABNORMAL LOW (ref 83.0–108.0)
pO2, Arterial: 82 mmHg — ABNORMAL LOW (ref 83.0–108.0)
pO2, Arterial: 69 mmHg — ABNORMAL LOW (ref 83.0–108.0)
pO2, Arterial: 73 mmHg — ABNORMAL LOW (ref 83.0–108.0)
pO2, Arterial: 69 mmHg — ABNORMAL LOW (ref 83.0–108.0)
pO2, Arterial: 68 mmHg — ABNORMAL LOW (ref 83.0–108.0)
pO2, Arterial: 72 mmHg — ABNORMAL LOW (ref 83.0–108.0)

## 2021-06-25 LAB — CBC
HCT: 26.6 % — ABNORMAL LOW (ref 39.0–52.0)
HCT: 24.9 % — ABNORMAL LOW (ref 39.0–52.0)
Hemoglobin: 7.9 g/dL — ABNORMAL LOW (ref 13.0–17.0)
Hemoglobin: 7.3 g/dL — ABNORMAL LOW (ref 13.0–17.0)
MCH: 28.9 pg (ref 26.0–34.0)
MCH: 29.4 pg (ref 26.0–34.0)
MCHC: 29.7 g/dL — ABNORMAL LOW (ref 30.0–36.0)
MCHC: 29.3 g/dL — ABNORMAL LOW (ref 30.0–36.0)
MCV: 97.4 fL (ref 80.0–100.0)
MCV: 100.4 fL — ABNORMAL HIGH (ref 80.0–100.0)
Platelets: 145 10*3/uL — ABNORMAL LOW (ref 150–400)
Platelets: 147 10*3/uL — ABNORMAL LOW (ref 150–400)
RBC: 2.73 MIL/uL — ABNORMAL LOW (ref 4.22–5.81)
RBC: 2.48 MIL/uL — ABNORMAL LOW (ref 4.22–5.81)
RDW: 15 % (ref 11.5–15.5)
RDW: 14.9 % (ref 11.5–15.5)
WBC: 18 10*3/uL — ABNORMAL HIGH (ref 4.0–10.5)
WBC: 21 10*3/uL — ABNORMAL HIGH (ref 4.0–10.5)
nRBC: 1.6 % — ABNORMAL HIGH (ref 0.0–0.2)
nRBC: 1.2 % — ABNORMAL HIGH (ref 0.0–0.2)

## 2021-06-25 LAB — BASIC METABOLIC PANEL
Anion gap: 8 (ref 5–15)
Anion gap: 7 (ref 5–15)
Anion gap: 8 (ref 5–15)
BUN: 88 mg/dL — ABNORMAL HIGH (ref 6–20)
BUN: 102 mg/dL — ABNORMAL HIGH (ref 6–20)
BUN: 108 mg/dL — ABNORMAL HIGH (ref 6–20)
CO2: 27 mmol/L (ref 22–32)
CO2: 27 mmol/L (ref 22–32)
CO2: 27 mmol/L (ref 22–32)
Calcium: 8.3 mg/dL — ABNORMAL LOW (ref 8.9–10.3)
Calcium: 8.1 mg/dL — ABNORMAL LOW (ref 8.9–10.3)
Calcium: 8.1 mg/dL — ABNORMAL LOW (ref 8.9–10.3)
Chloride: 109 mmol/L (ref 98–111)
Chloride: 107 mmol/L (ref 98–111)
Chloride: 108 mmol/L (ref 98–111)
Creatinine, Ser: 1.61 mg/dL — ABNORMAL HIGH (ref 0.61–1.24)
Creatinine, Ser: 1.84 mg/dL — ABNORMAL HIGH (ref 0.61–1.24)
Creatinine, Ser: 1.81 mg/dL — ABNORMAL HIGH (ref 0.61–1.24)
GFR, Estimated: 50 mL/min — ABNORMAL LOW (ref >60)
GFR, Estimated: 43 mL/min — ABNORMAL LOW (ref >60)
GFR, Estimated: 44 mL/min — ABNORMAL LOW (ref >60)
Glucose, Bld: 178 mg/dL — ABNORMAL HIGH (ref 70–99)
Glucose, Bld: 394 mg/dL — ABNORMAL HIGH (ref 70–99)
Glucose, Bld: 498 mg/dL — ABNORMAL HIGH (ref 70–99)
Potassium: 4.1 mmol/L (ref 3.5–5.1)
Potassium: 5.6 mmol/L — ABNORMAL HIGH (ref 3.5–5.1)
Potassium: 5 mmol/L (ref 3.5–5.1)
Sodium: 144 mmol/L (ref 135–145)
Sodium: 141 mmol/L (ref 135–145)
Sodium: 143 mmol/L (ref 135–145)

## 2021-06-25 LAB — GLUCOSE, CAPILLARY
Glucose-Capillary: 153 mg/dL — ABNORMAL HIGH (ref 70–99)
Glucose-Capillary: 161 mg/dL — ABNORMAL HIGH (ref 70–99)
Glucose-Capillary: 167 mg/dL — ABNORMAL HIGH (ref 70–99)
Glucose-Capillary: 168 mg/dL — ABNORMAL HIGH (ref 70–99)
Glucose-Capillary: 171 mg/dL — ABNORMAL HIGH (ref 70–99)
Glucose-Capillary: 183 mg/dL — ABNORMAL HIGH (ref 70–99)
Glucose-Capillary: 201 mg/dL — ABNORMAL HIGH (ref 70–99)
Glucose-Capillary: 339 mg/dL — ABNORMAL HIGH (ref 70–99)
Glucose-Capillary: 399 mg/dL — ABNORMAL HIGH (ref 70–99)
Glucose-Capillary: 413 mg/dL — ABNORMAL HIGH (ref 70–99)

## 2021-06-25 LAB — LACTATE DEHYDROGENASE: LDH: 341 U/L — ABNORMAL HIGH (ref 98–192)

## 2021-06-25 LAB — CULTURE, RESPIRATORY W GRAM STAIN

## 2021-06-25 LAB — FIBRINOGEN: Fibrinogen: 786 mg/dL — ABNORMAL HIGH (ref 210–475)

## 2021-06-25 LAB — LACTIC ACID, PLASMA
Lactic Acid, Venous: 0.6 mmol/L (ref 0.5–1.9)
Lactic Acid, Venous: 0.7 mmol/L (ref 0.5–1.9)

## 2021-06-25 LAB — HEPATIC FUNCTION PANEL
ALT: 98 U/L — ABNORMAL HIGH (ref 0–44)
AST: 69 U/L — ABNORMAL HIGH (ref 15–41)
Albumin: 1.7 g/dL — ABNORMAL LOW (ref 3.5–5.0)
Alkaline Phosphatase: 47 U/L (ref 38–126)
Bilirubin, Direct: 0.1 mg/dL (ref 0.0–0.2)
Indirect Bilirubin: 0.6 mg/dL (ref 0.3–0.9)
Total Bilirubin: 0.7 mg/dL (ref 0.3–1.2)
Total Protein: 6.1 g/dL — ABNORMAL LOW (ref 6.5–8.1)

## 2021-06-25 LAB — HEMOGLOBIN AND HEMATOCRIT, BLOOD
HCT: 26.5 % — ABNORMAL LOW (ref 39.0–52.0)
Hemoglobin: 7.9 g/dL — ABNORMAL LOW (ref 13.0–17.0)

## 2021-06-25 LAB — APTT
aPTT: 76 seconds — ABNORMAL HIGH (ref 24–36)
aPTT: 73 seconds — ABNORMAL HIGH (ref 24–36)

## 2021-06-25 LAB — PROTIME-INR
INR: 2 — ABNORMAL HIGH (ref 0.8–1.2)
Prothrombin Time: 23.1 seconds — ABNORMAL HIGH (ref 11.4–15.2)

## 2021-06-25 LAB — PREPARE RBC (CROSSMATCH)

## 2021-06-25 MED ORDER — INSULIN DETEMIR 100 UNIT/ML ~~LOC~~ SOLN
45.0000 [IU] | Freq: Two times a day (BID) | SUBCUTANEOUS | Status: DC
  Administered 2021-06-25 (×2): 45 [IU] via SUBCUTANEOUS
  Filled 2021-06-25 (×4): qty 0.45

## 2021-06-25 MED ORDER — PIVOT 1.5 CAL PO LIQD
1000.0000 mL | ORAL | Status: AC
  Administered 2021-06-25 – 2021-07-02 (×8): 1000 mL

## 2021-06-25 MED ORDER — VALPROIC ACID 250 MG/5ML PO SOLN
250.0000 mg | Freq: Three times a day (TID) | ORAL | Status: DC
  Administered 2021-06-25 – 2021-07-05 (×30): 250 mg
  Filled 2021-06-25 (×30): qty 5

## 2021-06-25 MED ORDER — PROSOURCE TF PO LIQD
45.0000 mL | Freq: Three times a day (TID) | ORAL | Status: DC
  Administered 2021-06-25 – 2021-07-05 (×28): 45 mL
  Filled 2021-06-25 (×28): qty 45

## 2021-06-25 MED ORDER — SODIUM ZIRCONIUM CYCLOSILICATE 10 G PO PACK
10.0000 g | PACK | Freq: Once | ORAL | Status: AC
  Administered 2021-06-25: 10 g
  Filled 2021-06-25: qty 1

## 2021-06-25 MED ORDER — INSULIN ASPART 100 UNIT/ML IJ SOLN: 12.0000 [IU] | INTRAMUSCULAR | Status: DC

## 2021-06-25 MED ORDER — CLONAZEPAM 1 MG PO TABS
1.5000 mg | ORAL_TABLET | Freq: Three times a day (TID) | ORAL | Status: DC
  Administered 2021-06-25 – 2021-07-05 (×28): 1.5 mg
  Filled 2021-06-25 (×29): qty 1

## 2021-06-25 MED ORDER — SODIUM CHLORIDE 0.9 % IV SOLN
4.0000 mg/h | INTRAVENOUS | Status: DC
  Administered 2021-06-25: 4 mg/h via INTRAVENOUS
  Filled 2021-06-25: qty 20

## 2021-06-25 MED ORDER — INSULIN ASPART 100 UNIT/ML IJ SOLN
15.0000 [IU] | INTRAMUSCULAR | Status: DC
  Administered 2021-06-25: 15 [IU] via SUBCUTANEOUS

## 2021-06-25 MED ORDER — INSULIN ASPART 100 UNIT/ML IJ SOLN
12.0000 [IU] | INTRAMUSCULAR | Status: DC
  Administered 2021-06-25 (×2): 12 [IU] via SUBCUTANEOUS

## 2021-06-25 MED ORDER — OXYCODONE HCL 5 MG PO TABS
15.0000 mg | ORAL_TABLET | Freq: Four times a day (QID) | ORAL | Status: DC
  Administered 2021-06-25 – 2021-07-05 (×35): 15 mg
  Filled 2021-06-25 (×35): qty 3

## 2021-06-25 MED ORDER — FUROSEMIDE 10 MG/ML IJ SOLN
60.0000 mg | Freq: Three times a day (TID) | INTRAMUSCULAR | Status: AC
  Administered 2021-06-25 (×3): 60 mg via INTRAVENOUS
  Filled 2021-06-25 (×3): qty 6

## 2021-06-25 MED ORDER — FREE WATER: 300.0000 mL | Status: DC

## 2021-06-25 MED ORDER — SODIUM CHLORIDE 0.9 % IV SOLN
0.0000 mg/h | INTRAVENOUS | Status: DC
  Administered 2021-06-25 – 2021-06-27 (×2): 4 mg/h via INTRAVENOUS
  Administered 2021-06-29: 07:00:00 3 mg/h via INTRAVENOUS
  Administered 2021-07-05: 4 mg/h via INTRAVENOUS
  Filled 2021-06-25 (×7): qty 20

## 2021-06-25 MED ORDER — HYDROMORPHONE BOLUS VIA INFUSION
0.2500 mg | INTRAVENOUS | Status: DC | PRN
Start: 2021-06-25 — End: 2021-07-05
  Administered 2021-06-25: 0.5 mg via INTRAVENOUS
  Administered 2021-06-25: 2 mg via INTRAVENOUS
  Administered 2021-06-26: 0.5 mg via INTRAVENOUS
  Administered 2021-06-26: 2 mg via INTRAVENOUS
  Administered 2021-06-26: 0.5 mg via INTRAVENOUS
  Administered 2021-06-27 (×3): 1 mg via INTRAVENOUS
  Administered 2021-06-27: 04:00:00 0.5 mg via INTRAVENOUS
  Administered 2021-06-28 (×2): 2 mg via INTRAVENOUS
  Administered 2021-06-28 – 2021-06-29 (×5): 1 mg via INTRAVENOUS
  Administered 2021-06-29: 2 mg via INTRAVENOUS
  Administered 2021-06-29 – 2021-06-30 (×5): 1 mg via INTRAVENOUS
  Administered 2021-06-30: 2 mg via INTRAVENOUS
  Administered 2021-06-30 (×3): 1 mg via INTRAVENOUS
  Administered 2021-06-30 (×2): 2 mg via INTRAVENOUS
  Administered 2021-07-01: 1 mg via INTRAVENOUS
  Administered 2021-07-01: 0.5 mg via INTRAVENOUS
  Administered 2021-07-01: 2 mg via INTRAVENOUS
  Administered 2021-07-01: 0.5 mg via INTRAVENOUS
  Administered 2021-07-01: 2 mg via INTRAVENOUS
  Administered 2021-07-01 – 2021-07-03 (×2): 1 mg via INTRAVENOUS
  Administered 2021-07-03: 0.25 mg via INTRAVENOUS
  Administered 2021-07-05 (×2): 2 mg via INTRAVENOUS
  Administered 2021-07-05: 1 mg via INTRAVENOUS
  Filled 2021-06-25: qty 2

## 2021-06-25 MED ORDER — SODIUM ZIRCONIUM CYCLOSILICATE 5 G PO PACK
5.0000 g | PACK | Freq: Once | ORAL | Status: DC
  Filled 2021-06-25: qty 1

## 2021-06-25 MED ORDER — INSULIN ASPART 100 UNIT/ML IJ SOLN
0.0000 [IU] | INTRAMUSCULAR | Status: DC
  Administered 2021-06-25: 3 [IU] via SUBCUTANEOUS
  Administered 2021-06-25: 11 [IU] via SUBCUTANEOUS
  Administered 2021-06-25: 15 [IU] via SUBCUTANEOUS

## 2021-06-25 MED ORDER — SODIUM CHLORIDE 0.9% IV SOLUTION: Freq: Once | INTRAVENOUS | Status: AC

## 2021-06-25 MED ORDER — INSULIN ASPART 100 UNIT/ML IJ SOLN
5.0000 [IU] | Freq: Once | INTRAMUSCULAR | Status: AC
  Administered 2021-06-25: 5 [IU] via SUBCUTANEOUS

## 2021-06-25 MED ORDER — FREE WATER
200.0000 mL | Status: DC
  Administered 2021-06-25 – 2021-07-01 (×29): 200 mL

## 2021-06-25 NOTE — Progress Notes (Signed)
Spoke with Chris Spore DO in regards to patient's evening labs. Orders received for 10 g Lokelma per tube now, 5 units aspart insulin SQ now, and to increase the tube feed coverage from 12 units to 15 units SQ. Also will give 1 unit pRBCs for hemoglobin of 7.3.

## 2021-06-25 NOTE — Progress Notes (Signed)
Pharmacy Antibiotic Note  Chris [name]. is a 55 y.o. male admitted on 06/25/2021 with pneumonia. Patient was started on BiPAP and found to be Influenza A positive, requiring intubation on 11/3. Patient cannulated and ECMO initiated on 11/7. Pharmacy consulted for cefepime dosing.   Patient was empirically started on vancomycin and cefepime with trach aspirate growing pseudomonas and MSSA. Vancomycin discontinued 11/10 and repeat sputum cultures now growing pan-sensitive pseudomonas.   Plan: Cefepime 2 gm IV every 8 hours for 14 days (until 11/21) Monitor renal function F/u clinical status  Height: 5\' 9"  (175.3 cm) Weight: 119.4 kg (263 lb 3.7 oz) IBW/kg (Calculated) : 70.7  Temp (24hrs), Avg:98.3 F (36.8 C), Min:98.2 F (36.8 C), Max:98.4 F (36.9 C)  Recent Labs  Lab 06/23/21 0401 06/23/21 1625 06/24/21 0319 06/24/21 1604 06/25/21 0210  WBC 15.2* 12.8* 19.2* 20.3* 18.0*  CREATININE 1.43* 1.33* 1.64* 1.83* 1.61*  LATICACIDVEN 0.7 0.7 0.7 1.0 0.6     Estimated Creatinine Clearance: 66.1 mL/min (A) (by C-G formula based on SCr of 1.61 mg/dL (H)).    Allergies  Allergen Reactions   Bactrim [Sulfamethoxazole-Trimethoprim] Hives    Antimicrobials this admission: Cefepime 11/8 >> 11/21 Ceftriaxone 11/7 >> 11/7 Vancomycin 11/7 >> 11/10 Unasyn 11/6 >> 11/7 Oseltamivir 11/3 >> 11/7  Microbiology results: 11/14 MRSA: neg 11/14 UA: neg 11/13 Sputum: few PsA - pan sensitive 11/7 Sputum: Staph aureus, Pseudomonas 11/7 Staphylococcus aureus: positive 11/7 MRSA PCR: negative 11/3 Resp panel: Influenza A positive 11/3 MRSA PCR: not detected  Thank you for allowing pharmacy to be a part of this patient's care.  13/3, PharmD PGY1 Pharmacy Resident 06/25/2021  3:05 PM  Please check [email or web address] for unit-specific pharmacy phone numbers.

## 2021-06-25 NOTE — Progress Notes (Signed)
Patient ID: Chris Round., male   DOB: Mar 09, 1966, 55 y.o.   MRN: [id number]     Advanced Heart Failure Rounding Note  PCP-Cardiologist: Glenetta Hew, MD   Subjective:    11/7: VV ECMO cannulation 11/10: Tracheostomy 11/14: Lurline Idol exchange  This morning, on NE 6.  HR 90s in atrial fibrillation when calm, up to 140s when agitated.  Currently 100s on amiodarone gtt 30 mg/hr.   Afebrile, on cefepime.  Trach aspirate from 11/7 with Pseudomonas and MSSA.  Oseltamivir for influenza completed. Remains on Solumedrol for COPD.  AKI with creatinine 2.37 => 1.85 => 1.63 => 1.27 => 1.31 => 1.43 => 1.64 => 1.6.  Excellent UOP but I/Os even given significant input.   FiO2 0.5 on vent, Vt 600 cc. Sweep has been weaned to 2.    Patient wakes up with sedation wean, agitated.   TFs ongoing.   ECMO: 3500 rpm Flow 4.7 Pvenous -87 DeltaP 28 Sweep 2 ABG 7.33/54/82/95% PTT 76 (goal 60-80), on bivalirudin Lactate 0.6 LDH 323 => 356 => 310 => 278 => 262 => 300 => 407 => 341  Na 144, on free H20 boluses 300 q4hrs  CXR: Left effusion + atelectasis, stable cannula position   Objective:   Weight Range: 119.4 kg Body mass index is 38.87 kg/m.   Vital Signs:   Temp:  [98 F (36.7 C)-98.8 F (37.1 C)] 98.2 F (36.8 C) (11/15 0400) Pulse Rate:  [75-103] 75 (11/14 1600) Resp:  [15-31] 22 (11/15 0730) BP: (112-122)/(55-56) 122/56 (11/14 1106) SpO2:  [90 %-100 %] 94 % (11/15 0730) Arterial Line BP: (69-208)/(40-78) 137/55 (11/15 0730) FiO2 (%):  [50 %] 50 % (11/15 0310) Weight:  [119.4 kg] 119.4 kg (11/15 0429) Last BM Date: 06/24/21  Weight change: Filed Weights   06/22/21 0500 06/24/21 0530 06/25/21 0429  Weight: 116 kg 119.6 kg 119.4 kg    Intake/Output:   Intake/Output Summary (Last 24 hours) at 06/25/2021 0748 Last data filed at 06/25/2021 0700 Gross per 24 hour  Intake 7537.52 ml  Output 7500 ml  Net 37.52 ml      Physical Exam    General: Awake, agitated Neck: RIJ  Crescent, LIJ CVL, no thyromegaly or thyroid nodule.  Lungs: Clear to auscultation bilaterally with normal respiratory effort. CV: Nondisplaced PMI.  Heart irregular S1/S2, no S3/S4, no murmur.  No peripheral edema.   Abdomen: Soft, nontender, no hepatosplenomegaly, no distention.  Skin: Intact without lesions or rashes.  Neurologic: Awake on vent Extremities: No clubbing or cyanosis.  HEENT: Normal.    Telemetry   Atrial fibrillation 100s (personally reviewed)  Labs    CBC Recent Labs    06/24/21 1604 06/24/21 1606 06/25/21 0210 06/25/21 0215 06/25/21 0557  WBC 20.3*  --  18.0*  --   --   HGB 8.8*   < > 7.9* 7.8* 7.8*  HCT 30.1*   < > 26.6* 23.0* 23.0*  MCV 99.0  --  97.4  --   --   PLT 161  --  145*  --   --    < > = values in this interval not displayed.   Basic Metabolic Panel Recent Labs    06/23/21 0807 06/23/21 0825 06/24/21 1604 06/24/21 1606 06/25/21 0210 06/25/21 0215 06/25/21 0557  NA  --    < > 144   < > 144 148* 148*  K  --    < > 5.0   < > 4.1 4.2 4.0  CL  --    < >  110  --  109  --   --   CO2  --    < > 25  --  27  --   --   GLUCOSE  --    < > 223*  --  178*  --   --   BUN  --    < > 88*  --  88*  --   --   CREATININE  --    < > 1.83*  --  1.61*  --   --   CALCIUM  --    < > 8.2*  --  8.3*  --   --   MG 2.4  --   --   --   --   --   --    < > = values in this interval not displayed.   Liver Function Tests Recent Labs    06/24/21 0319 06/25/21 0210  AST 123* 69*  ALT 126* 98*  ALKPHOS 46 47  BILITOT 0.9 0.7  PROT 6.5 6.1*  ALBUMIN 1.9* 1.7*   No results for input(s): LIPASE, AMYLASE in the last 72 hours. Cardiac Enzymes No results for input(s): CKTOTAL, CKMB, CKMBINDEX, TROPONINI in the last 72 hours.  BNP: BNP (last 3 results) Recent Labs    10/29/20 1214  BNP 102.9*    ProBNP (last 3 results) No results for input(s): PROBNP in the last 8760 hours.   D-Dimer No results for input(s): DDIMER in the last 72  hours. Hemoglobin A1C No results for input(s): HGBA1C in the last 72 hours. Fasting Lipid Panel No results for input(s): CHOL, HDL, LDLCALC, TRIG, CHOLHDL, LDLDIRECT in the last 72 hours.  Thyroid Function Tests No results for input(s): TSH, T4TOTAL, T3FREE, THYROIDAB in the last 72 hours.  Invalid input(s): FREET3  Other results:   Imaging    No results found.   Medications:     Scheduled Medications:  arformoterol  15 mcg Nebulization BID   budesonide (PULMICORT) nebulizer solution  0.25 mg Nebulization BID   chlorhexidine gluconate (MEDLINE KIT)  15 mL Mouth Rinse BID   Chlorhexidine Gluconate Cloth  6 each Topical Daily   clonazePAM  1.5 mg Per Tube TID   emtricitabine-tenofovir AF  1 tablet Per Tube Daily   And   dolutegravir  50 mg Per Tube Daily   feeding supplement (PROSource TF)  90 mL Per Tube TID   free water  300 mL Per Tube Q4H   furosemide  60 mg Intravenous Q8H   insulin detemir  30 Units Subcutaneous BID   mouth rinse  15 mL Mouth Rinse 10 times per day   methylPREDNISolone (SOLU-MEDROL) injection  40 mg Intravenous Daily   oxyCODONE  15 mg Per Tube Q6H   pantoprazole (PROTONIX) IV  40 mg Intravenous QHS   polyethylene glycol  17 g Per Tube BID   QUEtiapine  100 mg Per Tube BID   revefenacin  175 mcg Nebulization Daily   rosuvastatin  20 mg Per Tube Daily   senna-docusate  1 tablet Per Tube BID   sodium chloride flush  10-40 mL Intracatheter Q12H   sodium chloride flush  3 mL Intravenous Q12H   valproic acid  250 mg Per Tube TID    Infusions:  sodium chloride     sodium chloride 10 mL/hr at 06/20/2021 1502   sodium chloride     sodium chloride 20 mL/hr at 06/25/21 0700   sodium chloride     amiodarone 30 mg/hr (06/25/21  4801)   bivalirudin (ANGIOMAX) infusion 0.5 mg/mL (Non-ACS indications) 0.24 mg/kg/hr (06/25/21 0700)   ceFEPime (MAXIPIME) IV Stopped (06/25/21 0604)   dexmedetomidine (PRECEDEX) IV infusion 1.2 mcg/kg/hr (06/25/21 0700)    feeding supplement (VITAL 1.5 CAL) 1,000 mL (06/24/21 2335)   fentaNYL infusion INTRAVENOUS 300 mcg/hr (06/25/21 0700)   insulin 7 Units/hr (06/25/21 0700)   norepinephrine (LEVOPHED) Adult infusion 6 mcg/min (06/25/21 0700)    PRN Medications: sodium chloride, Place/Maintain arterial line **AND** sodium chloride, acetaminophen **OR** acetaminophen, albuterol, bisacodyl, dextrose, docusate, fentaNYL, ondansetron **OR** ondansetron (ZOFRAN) IV, sodium chloride flush, sodium chloride flush   Assessment/Plan   1. Acute hypoxemic and hypercarbic respiratory failure: In setting of COPD on home oxygen at baseline.  He has severe influenza infection.  Refractory hypoxemia and hypercarbia with maximal management.  He has underlying significant lung disease but has reasonable quality of life and had ARDS in the past requiring tracheostomy but was able to wean off.  RESP score was acceptable.  VV ECMO cannulation on 11/7.  Pseudomonas and MSSA in trach aspirate from 11/7.  Tracheostomy 11/10.  This morning, lactate is 0.6.  ABG 7.33/54/82/95%.  Sweep at 2, now weaning.  LDH stable 341.  Good UOP with IV Lasix but net even due to high input.  Need negative I/Os, volume status is one of the few variables we can actively change here.  - Continue incremental/gradual sweep wean => accept pH as low as 7.25 if patient tolerates.  - Bivalirudin for PTT 60-80.  - Give Lasix 60 mg IV every 8 hrs again today.  - Will limit fluid coming in (concentrate NE, switch to Fentanyl patch, switch off IV Lasix to [place]).  - Antibiotic coverage with cefepime - Solumedrol IV  - Anticipate prolonged course.  2. Atrial fibrillation: He has been in atrial fibrillation on all ECGs since 2021, suspect chronic at this point. On Eliquis at home. Soft BP has limited metoprolol.  - Will manage on bivalirudin.  - Continue amiodarone 30 mg/hr.    - Consider cardioversion in future.  3.  AKI: Creatinine 1.6 => 2.37 => 1.85 => 1.63 => 1.27  => 1.31 => 1.43 => 1.64 => 1.6.    - Lasix 60 mg IV every 8 hrs today.  4. Type 2 DM: Switch off gtt to [place].   5. Influenza: Severe.  Completed oseltamivir.  6. COPD: Still smokes 1-2 cigs/day.  Intermittent home oxygen.  Followed by pulmonary, has been stable at home for years.  7. HIV: Last viral load was undetectable.  On treatment.  8. Shock: Suspect primarily septic shock.  Echo showed EF 55-60% with normal RV. Currently on NE 6, wean as able.  9. FEN: Tube feeds.  10. Heme: Had 2 units PRBCs 11/9.  Hgb 7.9 today.  - Transfuse < 7.5  11. Hypernatremia: Na 144 today.  - Continue current free water boluses.    CRITICAL CARE Performed by: Loralie Champagne  Total critical care time: 40 minutes  Critical care time was exclusive of separately billable procedures and treating other patients.  Critical care was necessary to treat or prevent imminent or life-threatening deterioration.  Critical care was time spent personally by me on the following activities: development of treatment plan with patient and/or surrogate as well as nursing, discussions with consultants, evaluation of patient's response to treatment, examination of patient, obtaining history from patient or surrogate, ordering and performing treatments and interventions, ordering and review of laboratory studies, ordering and review of radiographic studies, pulse  oximetry and re-evaluation of patient's condition.     Length of Stay: 68  Loralie Champagne, MD  06/25/2021, 7:48 AM  Advanced Heart Failure Team Pager [phone] (M-F; 7a - 5p)  Please contact [facility] for night-coverage after hours (5p -7a ) and weekends on [email or web address]

## 2021-06-25 NOTE — Progress Notes (Signed)
NAME:  Chris Peter., MRN:  [id number], DOB:  06-Apr-1966, LOS: 12 ADMISSION DATE:  06/15/2021, CONSULTATION DATE:  06/15/2019 REFERRING MD:  Gaynell Face, CHIEF COMPLAINT:  hypercapnic respiratory failure   History of Present Illness:   55 year old man with O2 dependent COPD presenting with acute on chronic respiratory failure now on VV ECMO due to refractory hypoxia and hypercapnia.  Pertinent  Medical History   Past Medical History:  Diagnosis Date   Anxiety    Atrial fibrillation (HCC)    CHF (congestive heart failure) (HCC)    COPD (chronic obstructive pulmonary disease) (HCC)    Emphysema [[name]]   Depression    Diabetes mellitus without complication (HCC)    type 2   Emphysema of lung (HCC)    GERD (gastroesophageal reflux disease)    HIV disease (HCC)    Hypertension    Oxygen deficiency     Significant Hospital Events: Including procedures, antibiotic start and stop dates in addition to other pertinent events   11/7- VV ECMO started 11/9 - weaning sedation. Transfused 2 units PRBC to keep O2 carrying capacity.  11/9 - remains agitated with wake up  11/10 - tracheostomy tube placed  Interim History / Subjective:  No acute events overnight. More awake today.  Objective   Blood pressure (!) 122/56, pulse 75, temperature 98.2 F (36.8 C), temperature source Core, resp. rate (!) 29, height 5\' 9"  (1.753 m), weight 119.4 kg, SpO2 95 %.    Vent Mode: PCV FiO2 (%):  [50 %] 50 % Set Rate:  [18 bmp] 18 bmp PEEP:  [10 cmH20] 10 cmH20 Plateau Pressure:  [21 cmH20-30 cmH20] 21 cmH20   Intake/Output Summary (Last 24 hours) at 06/25/2021 0659 Last data filed at 06/25/2021 0600 Gross per 24 hour  Intake 7604.07 ml  Output 7325 ml  Net 279.07 ml    Filed Weights   06/22/21 0500 06/24/21 0530 06/25/21 0429  Weight: 116 kg 119.6 kg 119.4 kg    Examination: General: critically ill appearing man lying in bed in NAD HEENT: [place]/AT, eyes anicteric Neck: no bleeding around  trach Resp: bloody secretions from trach, less wheezing, still mild expiratory accessory muscle use. Rhales bilaterally. Cardio: S1S2, RRR Neuro: RASS -1, tracking, but not following commands. Extremities: improving edema, no cyanosis GU: external catheter, yellow urine Derm: warm, dry, no rashes. Well healed scars  I/O -32cc, net +10L for admission  Ancillary tests:   7.33/54/82/28.4 Na+  144 BUN 88 Cr 1.61 AST 69 ALT 98 LDH 341 LA 0.6 WBC 18 H/H 7.9/26.6 Platelets 145 INR 2.0 PTT 76 BG 130-170s Resp culture 11/13> few Pseudomonas>    Assessment & Plan:   Acute on chronic hypoxic and hypercapnic respiratory failure from influenza pneumonia on background of severe COPD, prior tracheostomy. On HOT 10 years. Acute COPD exacerbation; severe underlying COPD- 2019 PFTs: FEV1 1.14L (38%), RV 173%, DLCO 36% ARDS-  P:F 100 pre-cannulation Pseudomonas and MSSA HAP Now trach dependent Concern for undiagnosed OSA -Con't full vent support -Con't full VV ECMO support. AC with Bival.  -Completed tamiflu.  -14 days of antibiotics for pseudomonas. Still positive in culture 11/13. -repeat trach aspirate pending -Con't brovana, pulmicort, yupelri -Con't steroids; needed for ongoing ECMO weaning to optimize ventilation. -Keep lasix 60mg  TID -Limited baseline functional status; active but requires frequent breaks.  Family prepared for prolonged course and need for post-hospital rehabilitation.  Acute metabolic encephalopathy with severe agitation -Con't seroquel -increase enteral clonazepam, oxycodone, valproic acid -switch IV  fentanyl to dilaudid to reduce daily volume -priority is ECMO weaning rather than rehabilitation on ECMO  HIV, undetectable VL -Con't ART  Shock due to sedation -Norepi to maintain MAP >65; wean as tolerated.  Chronic Afib w/RVR -amiodarone for rate control- does well when sedated, tachcyardic when awake -bival for AC -on eliquis and diltiazem  PTA  H/o HFoEF -con't diuresis  GERD -con't PPI  Type 2 diabetes with hyperglycemia -con't levemir 30 units BID -transition off insulin gtt to [place] aspart  Hypernatremia from high insensitive losses, improving -con't FWF 200cc/h  Leukocytosis, improved -repeat sputum pending, still growing pseudomonas -con't 2 week course of cefepime -UA not suggestive of infection  -repeat MRSA nares still negative  Acute anemia -transfuse for Hb <7 or hemodynamically significant bleeding; need to be cautious with volume  Deconditioning, acute on chronic -prolonged recovery expected  Best Practice (right click and "Reselect all SmartList Selections" daily)   Diet/type: tubefeeds  DVT prophylaxis: bivalirudin GI prophylaxis: PPI Lines: Central line and Arterial Line Foley:  external catheter Code Status:  full code Last date of multidisciplinary goals of care discussion: continue for family updates    This patient is critically ill with multiple organ system failure which requires frequent high complexity decision making, assessment, support, evaluation, and titration of therapies. This was completed through the application of advanced monitoring technologies and extensive interpretation of multiple databases. During this encounter critical care time was devoted to patient care services described in this note for 55 minutes.  Steffanie Dunn, DO 06/25/21 8:23 AM [facility]

## 2021-06-25 NOTE — Progress Notes (Signed)
Nutrition Follow-up  DOCUMENTATION CODES:   Obesity unspecified  INTERVENTION:   Tube Feeding via Cortrak (post-pyloric):  Change to Pivot 1.5 at 70 ml/hr x 21 hours (holding 2 hours before and 1 hour after Tivicay dose). Prosource TF 45 ml TID Provides 2325 kcals, 171 g of protein and 1117 mL of free water   NUTRITION DIAGNOSIS:   Inadequate oral intake related to inability to eat as evidenced by NPO status.  Being addressed via TF  GOAL:   Provide needs based on ASPEN/SCCM guidelines  Met   MONITOR:   Vent status, TF tolerance, Labs  REASON FOR ASSESSMENT:   Ventilator, Consult Enteral/tube feeding initiation and management  ASSESSMENT:   55 yo male admitted with respiratory distress r/t influenza A. Required intubation on 11/3. PMH includes COPD, on 2 L oxygen at baseline, A fib, HF, HTN, DM-2, HIV.  11/07 VV ECMO cannulation 11/09 Cortrak advanced to LOT  11/10 Trach placed 11/14 Trach exchange  Tolerating Vital 1.5 at 65 ml/hr x 21 hours, Pro-source TF 90 mL TID via Cortrak  Free water decreased yesterday, sodium trended up and noted orders for increased free water again today  +BM,now with rectal tube with liquid stool  Labs: sodium 148 (H), Creatinine 1.61 Meds: lasix, ss novolog, novolog q 4 hours, levemir, solumedrol, tivicay   Diet Order:   Diet Order             Diet NPO time specified  Diet effective 0500 tomorrow                   EDUCATION NEEDS:   Not appropriate for education at this time  Skin:  Skin Assessment: Reviewed RN Assessment  Last BM:  11/15 rectal tube  Height:   Ht Readings from Last 1 Encounters:  06/23/21 _0  (1.753 m)    Weight:   Wt Readings from Last 1 Encounters:  06/25/21 119.4 kg     BMI:  Body mass index is 38.87 kg/m.  Estimated Nutritional Needs:   Kcal:  2200-2500 kcals  Protein:  145-180 g  Fluid:  >/= 2 L   Kerman Passey MS, RDN, LDN, CNSC Registered Dietitian III Clinical  Nutrition RD Pager and On-Call Pager Number Located in [place]

## 2021-06-25 NOTE — Progress Notes (Signed)
Physical Therapy Treatment Patient Details Name: Chris [name]. MRN: [id number] DOB: Jul 27, 1966 Today's Date: 06/25/2021   History of Present Illness 55 y.o. male admitted on 06/30/2021 for fever, SOB.  Found to have influenza A, A fib with RVR, acute on chronic respiratory failure with hypoxia (COPD exacerbation).  Pt intubated 11/3, self extubated 11/4, re-intubated 11/5, put on VV-ECMO 11/7, cortrak 11/9, s/p trach 11/10.  Pt with significant PMH of anxiety, A fib, CHF, COPD, DM, HIV, HTN, R knee fx surgery, exploratory laparotomy from [facility].    PT Comments    Pt remains on vent/ecmo. Difficulty reducing sedation due to agitation when sedation turned down. Expect once pt more alert and not agitated that he will begin to progress. Will continue attempts as pt's medical status allows.    Recommendations for follow up therapy are one component of a multi-disciplinary discharge planning process, led by the attending physician.  Recommendations may be updated based on patient status, additional functional criteria and insurance authorization.  Follow Up Recommendations  Acute inpatient rehab (3hours/day) (dependent on medical and physical progress)     Assistance Recommended at Discharge Frequent or [facility] bed;Other (comment);Wheelchair cushion (measurements PT);Wheelchair (measurements PT) (hoyer lift)    Recommendations for Other Services       Precautions / Restrictions Precautions Precautions: Other (comment) Precaution Comments: ecmo, vent     Mobility  Bed Mobility Overal bed mobility: Needs Assistance Bed Mobility: Rolling Rolling: +2 for physical assistance;Total assist         General bed mobility comments: Assist for all aspects with pt. Pt in chair position on arrival    Transfers                        Ambulation/Gait                   Stairs             Wheelchair  Mobility    Modified Rankin (Stroke Patients Only)       Balance                                            Cognition Arousal/Alertness: Lethargic   Overall Cognitive Status: Difficult to assess                                 General Comments: Pt with eyes open to stimulation but lethargic and not following any commands.        Exercises General Exercises - Upper Extremity Shoulder Flexion: PROM;Both;10 reps;Supine (Kept below 90 degrees due to ECMO/lines) Shoulder ABduction: PROM;Both;10 reps;Supine (Kept below 90 degrees due to ECMO/lines) Elbow Flexion: PROM;Both;10 reps;Supine Elbow Extension: PROM;Both;10 reps;Supine General Exercises - Lower Extremity Ankle Circles/Pumps: PROM;Both;10 reps;Supine Heel Slides: PROM;Both;5 reps;Supine    General Comments        Pertinent Vitals/Pain Pain Assessment: CPOT Facial Expression: Relaxed, neutral Body Movements: Absence of movements Muscle Tension: Relaxed Compliance with ventilator (intubated pts.): Tolerating ventilator or movement Vocalization (extubated pts.): N/A CPOT Total: 0    Home Living                          Prior Function  PT Goals (current goals can now be found in the care plan section) Progress towards PT goals: Not progressing toward goals - comment    Frequency    Min 3X/week      PT Plan Current plan remains appropriate    Co-evaluation              AM-PAC PT "6 Clicks" Mobility   Outcome Measure  Help needed turning from your back to your side while in a flat bed without using bedrails?: Total Help needed moving from lying on your back to sitting on the side of a flat bed without using bedrails?: Total Help needed moving to and from a bed to a chair (including a wheelchair)?: Total Help needed standing up from a chair using your arms (e.g., wheelchair or bedside chair)?: Total Help needed to walk in hospital room?:  Total Help needed climbing 3-5 steps with a railing? : Total 6 Click Score: 6    End of Session Equipment Utilized During Treatment: Other (comment) (vent, ecmo)   Patient left: in bed;with call bell/phone within reach;with restraints reapplied;with SCD's reapplied Nurse Communication: Other (comment) (nurse present) PT Visit Diagnosis: Muscle weakness (generalized) (M62.81);Difficulty in walking, not elsewhere classified (R26.2)     Time: 0109-3235 PT Time Calculation (min) (ACUTE ONLY): 17 min  Charges:  $Therapeutic Activity: 8-22 mins                     [facility] PT Acute Rehabilitation Services Pager [phone] Office [phone]    Angelina Ok [facility] 06/25/2021, 4:18 PM

## 2021-06-25 NOTE — Progress Notes (Signed)
ANTICOAGULATION CONSULT NOTE   Pharmacy Consult for Bivalirudin Indication:  ECMO and atrial fibrillation  Allergies  Allergen Reactions   Bactrim [Sulfamethoxazole-Trimethoprim] Hives    Patient Measurements: Height: 5\' 9"  (175.3 cm) Weight: 119.4 kg (263 lb 3.7 oz) IBW/kg (Calculated) : 70.7  Vital Signs: Temp: 98.4 F (36.9 C) (11/15 1755) Temp Source: Core (11/15 1755) BP: 97/50 (11/15 1541) Pulse Rate: 77 (11/15 1755)  Labs: Recent Labs    06/23/21 0401 06/23/21 0408 06/24/21 0319 06/24/21 0323 06/24/21 1604 06/24/21 1606 06/25/21 0210 06/25/21 0215 06/25/21 1403 06/25/21 1540 06/25/21 1542  HGB 8.0*   < > 9.2*   < > 8.8*   < > 7.9*   < > 7.8* 7.3* 7.5*  HCT 26.5*   < > 31.3*   < > 30.1*   < > 26.6*   < > 23.0* 24.9* 22.0*  PLT 147*   < > 149*  --  161  --  145*  --   --  147*  --   APTT 65*   < > 80*  --  83*  --  76*  --   --  73*  --   LABPROT 21.5*  --  23.4*  --   --   --  23.1*  --   --   --   --   INR 1.9*  --  2.1*  --   --   --  2.0*  --   --   --   --   CREATININE 1.43*   < > 1.64*  --  1.83*  --  1.61*  --   --  1.84*  --    < > = values in this interval not displayed.     Estimated Creatinine Clearance: 57.9 mL/min (A) (by C-G formula based on SCr of 1.84 mg/dL (H)).   Medical History: Past Medical History:  Diagnosis Date   Anxiety    Atrial fibrillation (HCC)    CHF (congestive heart failure) (HCC)    COPD (chronic obstructive pulmonary disease) (HCC)    Emphysema [[name]]   Depression    Diabetes mellitus without complication (HCC)    type 2   Emphysema of lung (HCC)    GERD (gastroesophageal reflux disease)    HIV disease (HCC)    Hypertension    Oxygen deficiency     Assessment: 55 Y/O male on chronic apixaban for atrial fibrillation. Previous placed on heparin in anticipation of line placement and possible procedures.  Last apixaban dose given @0948  11/7. Patient cannulated and ECMO initiated 11/7; received a heparin bolus at  cannulation, then switched to bivalrudin infusion. Pharmacy to dose bivalirudin.  aPTT of 73 sec is therapeutic on 0.24 mg/kg/hr. No bleeding related issues noted.  No pump issues.  Goal of Therapy:  aPTT 60-80 secs Monitor platelets by anticoagulation protocol: Yes   Plan:  Continue bivalirudin to 0.24 mg/kg/hr  Aptt and CBC q12hr   13/7, PharmD, [facility] Clinical Pharmacist Please see AMION for all Pharmacists' Contact Phone Numbers 06/25/2021, 6:11 PM

## 2021-06-25 NOTE — Progress Notes (Signed)
ANTICOAGULATION CONSULT NOTE   Pharmacy Consult for Bivalirudin Indication:  ECMO and atrial fibrillation  Allergies  Allergen Reactions   Bactrim [Sulfamethoxazole-Trimethoprim] Hives    Patient Measurements: Height: 5\' 9"  (175.3 cm) Weight: 119.4 kg (263 lb 3.7 oz) IBW/kg (Calculated) : 70.7  Vital Signs: Temp: 98.4 F (36.9 C) (11/15 0000) Temp Source: Core (11/15 0000)  Labs: Recent Labs    06/23/21 0401 06/23/21 0408 06/24/21 0319 06/24/21 0323 06/24/21 1604 06/24/21 1606 06/24/21 2303 06/25/21 0210 06/25/21 0215  HGB 8.0*   < > 9.2*   < > 8.8*   < > 7.8* 7.9* 7.8*  HCT 26.5*   < > 31.3*   < > 30.1*   < > 23.0* 26.6* 23.0*  PLT 147*   < > 149*  --  161  --   --  145*  --   APTT 65*   < > 80*  --  83*  --   --  76*  --   LABPROT 21.5*  --  23.4*  --   --   --   --  23.1*  --   INR 1.9*  --  2.1*  --   --   --   --  2.0*  --   CREATININE 1.43*   < > 1.64*  --  1.83*  --   --  1.61*  --    < > = values in this interval not displayed.     Estimated Creatinine Clearance: 66.1 mL/min (A) (by C-G formula based on SCr of 1.61 mg/dL (H)).   Medical History: Past Medical History:  Diagnosis Date   Anxiety    Atrial fibrillation (HCC)    CHF (congestive heart failure) (HCC)    COPD (chronic obstructive pulmonary disease) (HCC)    Emphysema [[name]]   Depression    Diabetes mellitus without complication (HCC)    type 2   Emphysema of lung (HCC)    GERD (gastroesophageal reflux disease)    HIV disease (HCC)    Hypertension    Oxygen deficiency     Assessment: 55 Y/O male on chronic apixaban for atrial fibrillation. Previous placed on heparin in anticipation of line placement and possible procedures.  Last apixaban dose given @0948  11/7. Patient cannulated and ECMO initiated 11/7; received a heparin bolus at cannulation, then switched to bivalrudin infusion. Pharmacy to dose bivalirudin.  aPTT of 76 sec is therapeutic after reducing to 0.24 0.28 mg/kg/hr. No  bleeding related issues noted. Hgb 7.9 (admission has been 7-8s), platelets stable at 147. No pump issues.  Goal of Therapy:  aPTT 60-80 secs Monitor platelets by anticoagulation protocol: Yes   Plan:  Continue bivalirudin to 0.24 mg/kg/hr  Aptt and CBC q12hr   13/7, PharmD PGY1 Pharmacy Resident 06/25/2021  5:07 AM  Please check [email or web address] for unit-specific pharmacy phone numbers.

## 2021-06-25 NOTE — Progress Notes (Signed)
Patient ID: Chris Rivers., male   DOB: May 22, 1966, 55 y.o.   MRN: [id number]  Extracorporeal support note     ECLS support day: 8 Indication: Severe COPD with influenza A   Configuration: VV ECMO, Crescent catheter right IJ   Drainage cannula: Right IJ Crescent Return cannula: Right IJ Crescent   Pump speed: 3500 Pump flow: 4.7 Pump used: Cardiohelp   Sweep gas: 2   Circuit check: Small area of thrombus 3 o'clock  Anticoagulant: bivalirudin Anticoagulation target: PTT 60-80   Changes in support: Try to drop sweep incrementally, tolerate pH down to as low as 7.25    Anticipated goals/duration of support: wean to decannulation  Chris [name] 06/25/2021 7:47 AM

## 2021-06-25 NOTE — Progress Notes (Signed)
Palliative:  HPI: 55 y.o. male  with past medical history of atrial fibrillation, CHF, HTN, severe COPD, h/o ARDS requiring trach but has since been decannulated, diabetes, HIV admitted on 07/01/2021 with fever, shortness of breath, cough worsening over past 3 days. +Influenza A. Required intubation 06/29/2021 and cannulation for initiation of VV ECMO 07/04/2021. Noted past palliative care involvement with previous hospital stay in 2019 when he required trach and CRRT. Trach placed 06/20/21.   I met today at Chris [name]'s bedside with his wife, Varney Biles. Chris [name]'s eyes are more open today. Varney Biles reports that he did look towards her earlier and reports she was told he was following more commands earlier today. I relayed to her plan (as discussed with Dr. Carlis Abbott yesterday) to really try and wean ECMO this week. We discussed the hope is to wean down to ventilator support and then proceed as similar to his illness in 2019. We discussed that towards the end of the week we will need to assess our progress and if not making progress towards weaning off ECMO we will need to reassess if ECMO is really benefiting Chris [name]. Varney Biles understands that there could be a limitation to ECMO support. She is processing this information but hopeful that he will wean. She expresses understanding.   We discussed self care as well and she is trying to get some sleep and care for herself. She had an injury not long ago and we discussed balance of caring for herself and also being present for [facility].    All questions/concerns addressed. Emotional support provided.   Exam: Eyes open and more alert. Tracking some but not following commands during my visit. VV ECMO. Tolerating vent support via trach. HR reg. No distress.   Plan: - Continue full scope with hopes of improvements.  - Plans to reassess progress with VV ECMO by the end of the week.   25 min  Vinie Sill, NP Palliative Medicine Team Pager [phone] (Please see [email or web address] for  schedule) Team Phone [phone]    Greater than 50%  of this time was spent counseling and coordinating care related to the above assessment and plan

## 2021-06-26 ENCOUNTER — Inpatient Hospital Stay (HOSPITAL_COMMUNITY): Payer: Medicare HMO

## 2021-06-26 DIAGNOSIS — J80 Acute respiratory distress syndrome: Secondary | ICD-10-CM | POA: Diagnosis not present

## 2021-06-26 DIAGNOSIS — N179 Acute kidney failure, unspecified: Secondary | ICD-10-CM

## 2021-06-26 DIAGNOSIS — J9622 Acute and chronic respiratory failure with hypercapnia: Secondary | ICD-10-CM | POA: Diagnosis not present

## 2021-06-26 DIAGNOSIS — J9601 Acute respiratory failure with hypoxia: Secondary | ICD-10-CM | POA: Diagnosis not present

## 2021-06-26 DIAGNOSIS — J101 Influenza due to other identified influenza virus with other respiratory manifestations: Secondary | ICD-10-CM | POA: Diagnosis not present

## 2021-06-26 DIAGNOSIS — J9621 Acute and chronic respiratory failure with hypoxia: Secondary | ICD-10-CM | POA: Diagnosis not present

## 2021-06-26 LAB — POCT I-STAT 7, (LYTES, BLD GAS, ICA,H+H)
Acid-Base Excess: 2 mmol/L (ref 0.0–2.0)
Acid-Base Excess: 3 mmol/L — ABNORMAL HIGH (ref 0.0–2.0)
Acid-Base Excess: 6 mmol/L — ABNORMAL HIGH (ref 0.0–2.0)
Acid-Base Excess: 6 mmol/L — ABNORMAL HIGH (ref 0.0–2.0)
Bicarbonate: 29.2 mmol/L — ABNORMAL HIGH (ref 20.0–28.0)
Bicarbonate: 30.6 mmol/L — ABNORMAL HIGH (ref 20.0–28.0)
Bicarbonate: 32.7 mmol/L — ABNORMAL HIGH (ref 20.0–28.0)
Bicarbonate: 33.1 mmol/L — ABNORMAL HIGH (ref 20.0–28.0)
Calcium, Ion: 1.23 mmol/L (ref 1.15–1.40)
Calcium, Ion: 1.24 mmol/L (ref 1.15–1.40)
Calcium, Ion: 1.25 mmol/L (ref 1.15–1.40)
Calcium, Ion: 1.26 mmol/L (ref 1.15–1.40)
HCT: 19 % — ABNORMAL LOW (ref 39.0–52.0)
HCT: 21 % — ABNORMAL LOW (ref 39.0–52.0)
HCT: 22 % — ABNORMAL LOW (ref 39.0–52.0)
HCT: 23 % — ABNORMAL LOW (ref 39.0–52.0)
Hemoglobin: 6.5 g/dL — CL (ref 13.0–17.0)
Hemoglobin: 7.1 g/dL — ABNORMAL LOW (ref 13.0–17.0)
Hemoglobin: 7.5 g/dL — ABNORMAL LOW (ref 13.0–17.0)
Hemoglobin: 7.8 g/dL — ABNORMAL LOW (ref 13.0–17.0)
O2 Saturation: 92 %
O2 Saturation: 92 %
O2 Saturation: 93 %
O2 Saturation: 94 %
Patient temperature: 36.9
Patient temperature: 36.9
Patient temperature: 37.3
Patient temperature: 37.6
Potassium: 3.8 mmol/L (ref 3.5–5.1)
Potassium: 4.4 mmol/L (ref 3.5–5.1)
Potassium: 4.4 mmol/L (ref 3.5–5.1)
Potassium: 4.4 mmol/L (ref 3.5–5.1)
Sodium: 146 mmol/L — ABNORMAL HIGH (ref 135–145)
Sodium: 149 mmol/L — ABNORMAL HIGH (ref 135–145)
Sodium: 151 mmol/L — ABNORMAL HIGH (ref 135–145)
Sodium: 152 mmol/L — ABNORMAL HIGH (ref 135–145)
TCO2: 31 mmol/L (ref 22–32)
TCO2: 33 mmol/L — ABNORMAL HIGH (ref 22–32)
TCO2: 35 mmol/L — ABNORMAL HIGH (ref 22–32)
TCO2: 35 mmol/L — ABNORMAL HIGH (ref 22–32)
pCO2 arterial: 60.2 mmHg — ABNORMAL HIGH (ref 32.0–48.0)
pCO2 arterial: 64.4 mmHg — ABNORMAL HIGH (ref 32.0–48.0)
pCO2 arterial: 66.3 mmHg (ref 32.0–48.0)
pCO2 arterial: 70.1 mmHg (ref 32.0–48.0)
pH, Arterial: 7.276 — ABNORMAL LOW (ref 7.350–7.450)
pH, Arterial: 7.283 — ABNORMAL LOW (ref 7.350–7.450)
pH, Arterial: 7.294 — ABNORMAL LOW (ref 7.350–7.450)
pH, Arterial: 7.313 — ABNORMAL LOW (ref 7.350–7.450)
pO2, Arterial: 71 mmHg — ABNORMAL LOW (ref 83.0–108.0)
pO2, Arterial: 76 mmHg — ABNORMAL LOW (ref 83.0–108.0)
pO2, Arterial: 79 mmHg — ABNORMAL LOW (ref 83.0–108.0)
pO2, Arterial: 80 mmHg — ABNORMAL LOW (ref 83.0–108.0)

## 2021-06-26 LAB — APTT
aPTT: 74 seconds — ABNORMAL HIGH (ref 24–36)
aPTT: 65 seconds — ABNORMAL HIGH (ref 24–36)

## 2021-06-26 LAB — BASIC METABOLIC PANEL
Anion gap: 10 (ref 5–15)
Anion gap: 6 (ref 5–15)
BUN: 117 mg/dL — ABNORMAL HIGH (ref 6–20)
BUN: 116 mg/dL — ABNORMAL HIGH (ref 6–20)
CO2: 27 mmol/L (ref 22–32)
CO2: 31 mmol/L (ref 22–32)
Calcium: 8.3 mg/dL — ABNORMAL LOW (ref 8.9–10.3)
Calcium: 8.2 mg/dL — ABNORMAL LOW (ref 8.9–10.3)
Chloride: 111 mmol/L (ref 98–111)
Chloride: 112 mmol/L — ABNORMAL HIGH (ref 98–111)
Creatinine, Ser: 1.81 mg/dL — ABNORMAL HIGH (ref 0.61–1.24)
Creatinine, Ser: 1.83 mg/dL — ABNORMAL HIGH (ref 0.61–1.24)
GFR, Estimated: 44 mL/min — ABNORMAL LOW (ref >60)
GFR, Estimated: 43 mL/min — ABNORMAL LOW (ref >60)
Glucose, Bld: 259 mg/dL — ABNORMAL HIGH (ref 70–99)
Glucose, Bld: 215 mg/dL — ABNORMAL HIGH (ref 70–99)
Potassium: 3.9 mmol/L (ref 3.5–5.1)
Potassium: 4.4 mmol/L (ref 3.5–5.1)
Sodium: 148 mmol/L — ABNORMAL HIGH (ref 135–145)
Sodium: 149 mmol/L — ABNORMAL HIGH (ref 135–145)

## 2021-06-26 LAB — GLUCOSE, CAPILLARY
Glucose-Capillary: 152 mg/dL — ABNORMAL HIGH (ref 70–99)
Glucose-Capillary: 163 mg/dL — ABNORMAL HIGH (ref 70–99)
Glucose-Capillary: 170 mg/dL — ABNORMAL HIGH (ref 70–99)
Glucose-Capillary: 178 mg/dL — ABNORMAL HIGH (ref 70–99)
Glucose-Capillary: 182 mg/dL — ABNORMAL HIGH (ref 70–99)
Glucose-Capillary: 187 mg/dL — ABNORMAL HIGH (ref 70–99)
Glucose-Capillary: 189 mg/dL — ABNORMAL HIGH (ref 70–99)
Glucose-Capillary: 193 mg/dL — ABNORMAL HIGH (ref 70–99)
Glucose-Capillary: 198 mg/dL — ABNORMAL HIGH (ref 70–99)
Glucose-Capillary: 198 mg/dL — ABNORMAL HIGH (ref 70–99)
Glucose-Capillary: 199 mg/dL — ABNORMAL HIGH (ref 70–99)
Glucose-Capillary: 200 mg/dL — ABNORMAL HIGH (ref 70–99)
Glucose-Capillary: 210 mg/dL — ABNORMAL HIGH (ref 70–99)
Glucose-Capillary: 211 mg/dL — ABNORMAL HIGH (ref 70–99)
Glucose-Capillary: 222 mg/dL — ABNORMAL HIGH (ref 70–99)
Glucose-Capillary: 244 mg/dL — ABNORMAL HIGH (ref 70–99)
Glucose-Capillary: 279 mg/dL — ABNORMAL HIGH (ref 70–99)
Glucose-Capillary: 346 mg/dL — ABNORMAL HIGH (ref 70–99)
Glucose-Capillary: 398 mg/dL — ABNORMAL HIGH (ref 70–99)
Glucose-Capillary: 402 mg/dL — ABNORMAL HIGH (ref 70–99)

## 2021-06-26 LAB — CBC
HCT: 25.6 % — ABNORMAL LOW (ref 39.0–52.0)
HCT: 22.7 % — ABNORMAL LOW (ref 39.0–52.0)
Hemoglobin: 7.7 g/dL — ABNORMAL LOW (ref 13.0–17.0)
Hemoglobin: 6.7 g/dL — CL (ref 13.0–17.0)
MCH: 29.3 pg (ref 26.0–34.0)
MCH: 29.1 pg (ref 26.0–34.0)
MCHC: 30.1 g/dL (ref 30.0–36.0)
MCHC: 29.5 g/dL — ABNORMAL LOW (ref 30.0–36.0)
MCV: 97.3 fL (ref 80.0–100.0)
MCV: 98.7 fL (ref 80.0–100.0)
Platelets: 151 10*3/uL (ref 150–400)
Platelets: 157 10*3/uL (ref 150–400)
RBC: 2.63 MIL/uL — ABNORMAL LOW (ref 4.22–5.81)
RBC: 2.3 MIL/uL — ABNORMAL LOW (ref 4.22–5.81)
RDW: 17 % — ABNORMAL HIGH (ref 11.5–15.5)
RDW: 16.9 % — ABNORMAL HIGH (ref 11.5–15.5)
WBC: 22.7 10*3/uL — ABNORMAL HIGH (ref 4.0–10.5)
WBC: 25.9 10*3/uL — ABNORMAL HIGH (ref 4.0–10.5)
nRBC: 2.9 % — ABNORMAL HIGH (ref 0.0–0.2)
nRBC: 1.9 % — ABNORMAL HIGH (ref 0.0–0.2)

## 2021-06-26 LAB — PROTIME-INR
INR: 2.1 — ABNORMAL HIGH (ref 0.8–1.2)
Prothrombin Time: 23.6 seconds — ABNORMAL HIGH (ref 11.4–15.2)

## 2021-06-26 LAB — HEPATIC FUNCTION PANEL
ALT: 79 U/L — ABNORMAL HIGH (ref 0–44)
AST: 57 U/L — ABNORMAL HIGH (ref 15–41)
Albumin: 1.6 g/dL — ABNORMAL LOW (ref 3.5–5.0)
Alkaline Phosphatase: 50 U/L (ref 38–126)
Bilirubin, Direct: <0.1 mg/dL (ref 0.0–0.2)
Total Bilirubin: 0.6 mg/dL (ref 0.3–1.2)
Total Protein: 5.7 g/dL — ABNORMAL LOW (ref 6.5–8.1)

## 2021-06-26 LAB — LACTIC ACID, PLASMA: Lactic Acid, Venous: 0.9 mmol/L (ref 0.5–1.9)

## 2021-06-26 LAB — LACTATE DEHYDROGENASE: LDH: 333 U/L — ABNORMAL HIGH (ref 98–192)

## 2021-06-26 LAB — FIBRINOGEN: Fibrinogen: 614 mg/dL — ABNORMAL HIGH (ref 210–475)

## 2021-06-26 MED ORDER — FUROSEMIDE 10 MG/ML IJ SOLN
60.0000 mg | Freq: Three times a day (TID) | INTRAMUSCULAR | Status: AC
  Administered 2021-06-26 (×3): 60 mg via INTRAVENOUS
  Filled 2021-06-26 (×3): qty 6

## 2021-06-26 MED ORDER — AMIODARONE HCL 200 MG PO TABS
200.0000 mg | ORAL_TABLET | Freq: Two times a day (BID) | ORAL | Status: DC
  Administered 2021-06-26 – 2021-06-29 (×7): 200 mg via ORAL
  Filled 2021-06-26 (×7): qty 1

## 2021-06-26 MED ORDER — SODIUM CHLORIDE 0.9% IV SOLUTION: Freq: Once | INTRAVENOUS | Status: AC

## 2021-06-26 MED ORDER — VASOPRESSIN 20 UNITS/100 ML INFUSION FOR SHOCK
0.0000 [IU]/min | INTRAVENOUS | Status: DC
  Administered 2021-06-26 – 2021-07-01 (×12): 0.03 [IU]/min via INTRAVENOUS
  Administered 2021-07-01 – 2021-07-02 (×2): 0.04 [IU]/min via INTRAVENOUS
  Administered 2021-07-02 – 2021-07-03 (×3): 0.03 [IU]/min via INTRAVENOUS
  Administered 2021-07-04 – 2021-07-05 (×4): 0.04 [IU]/min via INTRAVENOUS
  Filled 2021-06-26 (×25): qty 100

## 2021-06-26 MED ORDER — MIDAZOLAM HCL 2 MG/2ML IJ SOLN
INTRAMUSCULAR | Status: AC
  Filled 2021-06-26: qty 2

## 2021-06-26 MED ORDER — DEXTROSE 50 % IV SOLN: 0.0000 mL | INTRAVENOUS | Status: DC | PRN

## 2021-06-26 MED ORDER — INSULIN REGULAR(HUMAN) IN NACL 100-0.9 UT/100ML-% IV SOLN
INTRAVENOUS | Status: DC
  Administered 2021-06-26: 15 [IU]/h via INTRAVENOUS
  Administered 2021-06-26: 16 [IU]/h via INTRAVENOUS
  Administered 2021-06-26: 20 [IU]/h via INTRAVENOUS
  Administered 2021-06-26: 17 [IU]/h via INTRAVENOUS
  Administered 2021-06-27: 10:00:00 18 [IU]/h via INTRAVENOUS
  Administered 2021-06-27: 17:00:00 7.5 [IU]/h via INTRAVENOUS
  Administered 2021-06-27: 15 [IU]/h via INTRAVENOUS
  Administered 2021-06-28: 11.5 [IU]/h via INTRAVENOUS
  Administered 2021-06-29: 2.6 [IU]/h via INTRAVENOUS
  Administered 2021-06-29: 21 [IU]/h via INTRAVENOUS
  Administered 2021-06-29: 8.5 [IU]/h via INTRAVENOUS
  Administered 2021-06-30: 3.4 [IU]/h via INTRAVENOUS
  Administered 2021-07-01: 11.5 [IU]/h via INTRAVENOUS
  Administered 2021-07-01: 9 [IU]/h via INTRAVENOUS
  Administered 2021-07-01: 20 [IU]/h via INTRAVENOUS
  Administered 2021-07-02 (×2): 11 [IU]/h via INTRAVENOUS
  Administered 2021-07-03: 4.4 [IU]/h via INTRAVENOUS
  Administered 2021-07-04: 6.5 [IU]/h via INTRAVENOUS
  Administered 2021-07-05: 5.5 [IU]/h via INTRAVENOUS
  Filled 2021-06-26 (×4): qty 100
  Filled 2021-06-26: qty 200
  Filled 2021-06-26 (×9): qty 100
  Filled 2021-06-26: qty 200
  Filled 2021-06-26 (×5): qty 100

## 2021-06-26 MED ORDER — MIDAZOLAM HCL 2 MG/2ML IJ SOLN
2.0000 mg | INTRAMUSCULAR | Status: DC | PRN
  Administered 2021-06-26 – 2021-06-28 (×4): 2 mg via INTRAVENOUS
  Filled 2021-06-26 (×3): qty 2

## 2021-06-26 NOTE — Progress Notes (Signed)
ANTICOAGULATION CONSULT NOTE   Pharmacy Consult for Bivalirudin Indication:  ECMO and atrial fibrillation  Allergies  Allergen Reactions   Bactrim [Sulfamethoxazole-Trimethoprim] Hives    Patient Measurements: Height: 5\' 9"  (175.3 cm) Weight: 119.4 kg (263 lb 3.7 oz) IBW/kg (Calculated) : 70.7  Vital Signs: Temp: 99.5 F (37.5 C) (11/16 0400) Temp Source: Axillary (11/16 0400) BP: 145/56 (11/16 0406) Pulse Rate: 124 (11/16 0406)  Labs: Recent Labs    06/24/21 0319 06/24/21 0323 06/25/21 0210 06/25/21 0215 06/25/21 1540 06/25/21 1542 06/25/21 2145 06/26/21 0053 06/26/21 0349 06/26/21 0357  HGB 9.2*   < > 7.9*   < > 7.3*   < > 7.9* 7.8* 7.7* 7.5*  HCT 31.3*   < > 26.6*   < > 24.9*   < > 26.5* 23.0* 25.6* 22.0*  PLT 149*   < > 145*  --  147*  --   --   --  151  --   APTT 80*   < > 76*  --  73*  --   --   --  74*  --   LABPROT 23.4*  --  23.1*  --   --   --   --   --  23.6*  --   INR 2.1*  --  2.0*  --   --   --   --   --  2.1*  --   CREATININE 1.64*   < > 1.61*  --  1.84*  --  1.81*  --  1.81*  --    < > = values in this interval not displayed.     Estimated Creatinine Clearance: 58.8 mL/min (A) (by C-G formula based on SCr of 1.81 mg/dL (H)).   Medical History: Past Medical History:  Diagnosis Date   Anxiety    Atrial fibrillation (HCC)    CHF (congestive heart failure) (HCC)    COPD (chronic obstructive pulmonary disease) (HCC)    Emphysema [[name]]   Depression    Diabetes mellitus without complication (HCC)    type 2   Emphysema of lung (HCC)    GERD (gastroesophageal reflux disease)    HIV disease (HCC)    Hypertension    Oxygen deficiency     Assessment: 55 Y/O male on chronic apixaban for atrial fibrillation. Previous placed on heparin in anticipation of line placement and possible procedures.  Last apixaban dose given @0948  11/7. Patient cannulated and ECMO initiated 11/7; received a heparin bolus at cannulation, then switched to bivalrudin  infusion. Pharmacy to dose bivalirudin.  aPTT of 74 sec is therapeutic on 0.24 mg/kg/hr. No bleeding related issues noted.  No pump issues.  Goal of Therapy:  aPTT 60-80 secs Monitor platelets by anticoagulation protocol: Yes   Plan:  Continue bivalirudin to 0.24 mg/kg/hr  Aptt and CBC q12hr   13/7, PharmD PGY1 Pharmacy Resident 06/26/2021  5:12 AM  Please check [email or web address] for unit-specific pharmacy phone numbers.

## 2021-06-26 NOTE — Progress Notes (Signed)
ANTICOAGULATION CONSULT NOTE   Pharmacy Consult for Bivalirudin Indication:  ECMO and atrial fibrillation  Allergies  Allergen Reactions   Bactrim [Sulfamethoxazole-Trimethoprim] Hives    Patient Measurements: Height: 5\' 9"  (175.3 cm) Weight: 120.6 kg (265 lb 14 oz) IBW/kg (Calculated) : 70.7  Vital Signs: Temp: 98.4 F (36.9 C) (11/16 1200) Temp Source: Core (11/16 1200) BP: 128/57 (11/16 1605) Pulse Rate: 119 (11/16 1605)  Labs: Recent Labs    06/24/21 0319 06/24/21 0323 06/25/21 0210 06/25/21 0215 06/25/21 1540 06/25/21 1542 06/25/21 2145 06/26/21 0053 06/26/21 0349 06/26/21 0357 06/26/21 1600 06/26/21 1603  HGB 9.2*   < > 7.9*   < > 7.3*   < > 7.9*   < > 7.7* 7.5*  --  6.5*  HCT 31.3*   < > 26.6*   < > 24.9*   < > 26.5*   < > 25.6* 22.0*  --  19.0*  PLT 149*   < > 145*  --  147*  --   --   --  151  --   --   --   APTT 80*   < > 76*  --  73*  --   --   --  74*  --  65*  --   LABPROT 23.4*  --  23.1*  --   --   --   --   --  23.6*  --   --   --   INR 2.1*  --  2.0*  --   --   --   --   --  2.1*  --   --   --   CREATININE 1.64*   < > 1.61*  --  1.84*  --  1.81*  --  1.81*  --   --   --    < > = values in this interval not displayed.     Estimated Creatinine Clearance: 59.2 mL/min (A) (by C-G formula based on SCr of 1.81 mg/dL (H)).   Medical History: Past Medical History:  Diagnosis Date   Anxiety    Atrial fibrillation (HCC)    CHF (congestive heart failure) (HCC)    COPD (chronic obstructive pulmonary disease) (HCC)    Emphysema [[name]]   Depression    Diabetes mellitus without complication (HCC)    type 2   Emphysema of lung (HCC)    GERD (gastroesophageal reflux disease)    HIV disease (HCC)    Hypertension    Oxygen deficiency     Assessment: 55 Y/O male on chronic apixaban for atrial fibrillation. Previous placed on heparin in anticipation of line placement and possible procedures. Last apixaban dose given @0948  11/7. Patient cannulated and  ECMO initiated 11/7; received a heparin bolus at cannulation, then switched to bivalrudin infusion. Pharmacy to dose bivalirudin.  aPTT 65 sec is therapeutic on 0.24 mg/kg/hr. Hg down to 6.7 (transfusing for Hg<7), plt wnl. No bleeding related issues noted.  No pump issues reported.  Goal of Therapy:  aPTT 60-80 secs Monitor platelets by anticoagulation protocol: Yes   Plan:  Continue bivalirudin at 0.24 mg/kg/hr  Monitor q12h Aptt and CBC, s/sx bleeding F/u Hg trend s/p transfusion   13/7, PharmD, BCPS Please check AMION for all [facility] Pharmacy contact numbers Clinical Pharmacist 06/26/2021 4:40 PM

## 2021-06-26 NOTE — Progress Notes (Signed)
NAME:  Chris [name]., MRN:  [id number], DOB:  06/29/1966, LOS: 13 ADMISSION DATE:  06-14-2021, CONSULTATION DATE:  06/15/2019 REFERRING MD:  Gaynell Face, CHIEF COMPLAINT:  hypercapnic respiratory failure   History of Present Illness:   55 year old man with O2 dependent COPD presenting with acute on chronic respiratory failure now on VV ECMO due to refractory hypoxia and hypercapnia.  Pertinent  Medical History   Past Medical History:  Diagnosis Date   Anxiety    Atrial fibrillation (HCC)    CHF (congestive heart failure) (HCC)    COPD (chronic obstructive pulmonary disease) (HCC)    Emphysema [[name]]   Depression    Diabetes mellitus without complication (HCC)    type 2   Emphysema of lung (HCC)    GERD (gastroesophageal reflux disease)    HIV disease (HCC)    Hypertension    Oxygen deficiency     Significant Hospital Events: Including procedures, antibiotic start and stop dates in addition to other pertinent events   11/7- VV ECMO started 11/9 - weaning sedation. Transfused 2 units PRBC to keep O2 carrying capacity.  11/9 - remains agitated with wake up  11/10 - tracheostomy tube placed 11/14 trach exchanged to distal XLT due to cuff leak  Interim History / Subjective:  1 unit pRBCs overnight.  Back on insulin drip for uncontrolled hyperglycemia  Objective   Blood pressure (!) 145/56, pulse (!) 124, temperature 99.5 F (37.5 C), temperature source Axillary, resp. rate 15, height 5\' 9"  (1.753 m), weight 120.6 kg, SpO2 97 %.    Vent Mode: PCV FiO2 (%):  [50 %] 50 % Set Rate:  [18 bmp] 18 bmp PEEP:  [10 cmH20] 10 cmH20 Plateau Pressure:  [20 cmH20-28 cmH20] 20 cmH20   Intake/Output Summary (Last 24 hours) at 06/26/2021 0703 Last data filed at 06/26/2021 0600 Gross per 24 hour  Intake 6993.48 ml  Output 6125 ml  Net 868.48 ml    Filed Weights   06/24/21 0530 06/25/21 0429 06/26/21 0500  Weight: 119.6 kg 119.4 kg 120.6 kg    Examination: General: Critically  ill-appearing man lying in bed no acute distress, trached, sedated, on ECMO HEENT: [place]/AT, eyes anicteric Neck: No bleeding around tracheostomy tube Resp: Improvement in secretions from the trach, mild dyssynchrony with the ventilator, no wheezing Cardio: S1-S2, regular rate and rhythm Neuro: RASS -3, able to track when awake, but not following commands. Extremities: Minimal improvement in edema, no cyanosis GU: Yellow urine, external urinary catheter Derm: No rashes, well-healed scars.  I/O +1L, net +11.4L for admission  Ancillary tests:   7. 2 8/66/6/31 Na+  148 BUN 117 Cr 1.81 AST 57 ALT 79 LDH 333 LA 0.4 WBC 22.7 H/H 7.7/25.6 Platelets 151 INR 2.1 PTT 74 BG 130-170s Resp culture 11/13> few Pseudomonas> pansensitive   Assessment & Plan:   Acute on chronic hypoxic and hypercapnic respiratory failure from influenza pneumonia on background of severe COPD, prior tracheostomy. On HOT 10 years. Acute COPD exacerbation; severe underlying COPD- 2019 PFTs: FEV1 1.14L (38%), RV 173%, DLCO 36% ARDS-  P:F 100 pre-cannulation Pseudomonas and MSSA HAP Now trach dependent Concern for undiagnosed OSA -Continue full vent support. - Continue ECMO with bivalirudin.  Weaning aggressively.-Completed Tamiflu - Complete 14 days of antipseudomonal coverage.  Need test for cure> may require up to 3 months of therapy -Continue Brovana, Pulmicort, budesonide - Continue steroids - Lasix 60 3 times daily, reduce intake where able -Limited baseline functional status; active but requires frequent  breaks.  Family prepared for prolonged course and need for post-hospital rehabilitation.  Acute metabolic encephalopathy with severe agitation -Con't seroquel -Continue enteral clonazepam, oxycodone, valproic acid -Anemia dilaudid rather than fentanyl to reduce daily volume -priority is ECMO weaning rather than rehabilitation on ECMO given baseline disability  HIV, undetectable VL -Con't ART  Shock  due to sedation -Norepi to maintain MAP >65; wean as tolerated.  Chronic Afib w/RVR -Amiodarone for rate control, switch to p.o. - Add vasopressin with norepinephrine to reduce potential for tachycardia - Continue to hold PTA Eliquis and diltiazem - Anticoagulation with bivalirudin while on ECMO  H/o HFpEF -con't aggressive diuresis  GERD Continue PPI  Type 2 diabetes with hyperglycemia -Back on IV insulin.  Suspect poor subcu absorption due to edema - Goal BG 140-180  Hypernatremia from high insensitive losses, improving -Continue free water flushes  Leukocytosis, worsened again-question if this could be related to blood product transfusion.  No obvious new source of sepsis. -repeat sputum pending, still growing pseudomonas -Complete 2-week course of cefepime.  Needs retest trend sure he is cleared Pseudomonas. -UA not suggestive of infection  -repeat MRSA nares still negative  Acute anemia -Transfuse for hemoglobin less than 7 or hemodynamically significant bleeding.  Deconditioning, acute on chronic -prolonged recovery expected.  I anticipate he will not return to his former functional status.  Best Practice (right click and "Reselect all SmartList Selections" daily)   Diet/type: tubefeeds  DVT prophylaxis: bivalirudin GI prophylaxis: PPI Lines: Central line and Arterial Line Foley:  external catheter Code Status:  full code Last date of multidisciplinary goals of care discussion: continue for family updates    This patient is critically ill with multiple organ system failure which requires frequent high complexity decision making, assessment, support, evaluation, and titration of therapies. This was completed through the application of advanced monitoring technologies and extensive interpretation of multiple databases. During this encounter critical care time was devoted to patient care services described in this note for 60 minutes.  Julian Hy, DO 06/26/21 4:13  PM [facility]

## 2021-06-26 NOTE — Progress Notes (Signed)
Patient ID: Chris [name]., male   DOB: [birth year], 54 y.o.   MRN: [id number]  Extracorporeal support note     ECLS support day: 9 Indication: Severe COPD with influenza A   Configuration: VV ECMO, Crescent catheter right IJ   Drainage cannula: Right IJ Crescent Return cannula: Right IJ Crescent   Pump speed: 3500 Pump flow: 4.73 Pump used: Cardiohelp   Sweep gas: 2   Circuit check: Small area of thrombus 3 o'clock  Anticoagulant: bivalirudin Anticoagulation target: PTT 60-80   Changes in support: Try to drop sweep incrementally, tolerate pH down to as low as 7.25    Anticipated goals/duration of support: wean to decannulation  [name] 06/26/2021 7:01 AM

## 2021-06-26 NOTE — Progress Notes (Signed)
OT Cancellation Note  Patient Details Name: Chris [name]. MRN: [id number] DOB: Aug 10, 1966   Cancelled Treatment:    Reason Eval/Treat Not Completed: Patient not medically ready (Per RN, plan is for pt to remain sedated until decannulated from ECMO. OT will sign off and await new order.)  [name] 06/26/2021, 11:12 AM [name], OTR/L Acute Rehabilitation Services Pager: [phone] Office: [phone]

## 2021-06-26 NOTE — Progress Notes (Signed)
Patient ID: Chris [name]., male   DOB: 1965-08-21, 55 y.o.   MRN: [id number]     Advanced Heart Failure Rounding Note  PCP-Cardiologist: Glenetta Hew, MD   Subjective:    11/7: VV ECMO cannulation 11/10: Tracheostomy 11/14: Lurline Idol exchange  This morning, on NE 18.  HR 90s in atrial fibrillation when calm on amiodarone gtt 30 mg/hr.   Tm 99.5, on cefepime.  Trach aspirate from 11/7 with Pseudomonas and MSSA, 11/13 trach aspirate still with Pseudomonas.  Oseltamivir for influenza completed. Remains on Solumedrol for COPD.  AKI with creatinine 2.37 => 1.85 => 1.63 => 1.27 => 1.31 => 1.43 => 1.64 => 1.6 => 1.81 but now BUN up to 117.  Excellent UOP but I/Os even given significant input.   FiO2 0.5 on vent. Sweep has been weaned to 2.    Patient wakes up with sedation wean, agitated.   TFs ongoing.   ECMO: 3500 rpm Flow 4.73 Pvenous -90 DeltaP 28 Sweep 2 ABG 7.28/66/76/92% PTT 74 (goal 60-80), on bivalirudin Lactate 0.9 LDH 323 => 356 => 310 => 278 => 262 => 300 => 407 => 341 => 333  Na 148, on free H20 boluses 300 q4hrs  CXR: Left effusion + atelectasis, stable cannula position   Objective:   Weight Range: 120.6 kg Body mass index is 39.26 kg/m.   Vital Signs:   Temp:  [98.4 F (36.9 C)-99.5 F (37.5 C)] 99.5 F (37.5 C) (11/16 0400) Pulse Rate:  [76-132] 124 (11/16 0406) Resp:  [14-33] 15 (11/16 0630) BP: (97-188)/(50-70) 145/56 (11/16 0406) SpO2:  [90 %-100 %] 97 % (11/16 0645) Arterial Line BP: (93-170)/(40-65) 113/48 (11/16 0645) FiO2 (%):  [50 %] 50 % (11/16 0600) Weight:  [120.6 kg] 120.6 kg (11/16 0500) Last BM Date: 06/26/21  Weight change: Filed Weights   06/24/21 0530 06/25/21 0429 06/26/21 0500  Weight: 119.6 kg 119.4 kg 120.6 kg    Intake/Output:   Intake/Output Summary (Last 24 hours) at 06/26/2021 5916 Last data filed at 06/26/2021 0600 Gross per 24 hour  Intake 6993.48 ml  Output 6125 ml  Net 868.48 ml      Physical Exam     General: Sedated on vent Neck: RIJ [place], LIJ CVL, no thyromegaly or thyroid nodule.  Lungs: Clear to auscultation bilaterally with normal respiratory effort. CV: Nondisplaced PMI.  Heart irregular S1/S2, no S3/S4, no murmur.  Trace ankle edema.  Abdomen: Soft, nontender, no hepatosplenomegaly, no distention.  Skin: Intact without lesions or rashes.  Neurologic: Alert and oriented x 3.  Psych: Normal affect. Extremities: No clubbing or cyanosis.  HEENT: Normal.    Telemetry   Atrial fibrillation 90s (personally reviewed)  Labs    CBC Recent Labs    06/25/21 1540 06/25/21 1542 06/26/21 0349 06/26/21 0357  WBC 21.0*  --  22.7*  --   HGB 7.3*   < > 7.7* 7.5*  HCT 24.9*   < > 25.6* 22.0*  MCV 100.4*  --  97.3  --   PLT 147*  --  151  --    < > = values in this interval not displayed.   Basic Metabolic Panel Recent Labs    06/23/21 0807 06/23/21 0825 06/25/21 2145 06/26/21 0053 06/26/21 0349 06/26/21 0357  NA  --    < > 143   < > 148* 149*  K  --    < > 5.0   < > 3.9 3.8  CL  --    < >  108  --  111  --   CO2  --    < > 27  --  27  --   GLUCOSE  --    < > 498*  --  259*  --   BUN  --    < > 108*  --  117*  --   CREATININE  --    < > 1.81*  --  1.81*  --   CALCIUM  --    < > 8.1*  --  8.3*  --   MG 2.4  --   --   --   --   --    < > = values in this interval not displayed.   Liver Function Tests Recent Labs    06/25/21 0210 06/26/21 0349  AST 69* 57*  ALT 98* 79*  ALKPHOS 47 50  BILITOT 0.7 0.6  PROT 6.1* 5.7*  ALBUMIN 1.7* 1.6*   No results for input(s): LIPASE, AMYLASE in the last 72 hours. Cardiac Enzymes No results for input(s): CKTOTAL, CKMB, CKMBINDEX, TROPONINI in the last 72 hours.  BNP: BNP (last 3 results) Recent Labs    10/29/20 1214  BNP 102.9*    ProBNP (last 3 results) No results for input(s): PROBNP in the last 8760 hours.   D-Dimer No results for input(s): DDIMER in the last 72 hours. Hemoglobin A1C No results for  input(s): HGBA1C in the last 72 hours. Fasting Lipid Panel No results for input(s): CHOL, HDL, LDLCALC, TRIG, CHOLHDL, LDLDIRECT in the last 72 hours.  Thyroid Function Tests No results for input(s): TSH, T4TOTAL, T3FREE, THYROIDAB in the last 72 hours.  Invalid input(s): FREET3  Other results:   Imaging    No results found.   Medications:     Scheduled Medications:  amiodarone  200 mg Oral BID   arformoterol  15 mcg Nebulization BID   budesonide (PULMICORT) nebulizer solution  0.25 mg Nebulization BID   chlorhexidine gluconate (MEDLINE KIT)  15 mL Mouth Rinse BID   Chlorhexidine Gluconate Cloth  6 each Topical Daily   clonazePAM  1.5 mg Per Tube TID   emtricitabine-tenofovir AF  1 tablet Per Tube Daily   And   dolutegravir  50 mg Per Tube Daily   feeding supplement (PROSource TF)  45 mL Per Tube TID   free water  200 mL Per Tube Q4H   furosemide  60 mg Intravenous Q8H   insulin detemir  45 Units Subcutaneous BID   mouth rinse  15 mL Mouth Rinse 10 times per day   methylPREDNISolone (SOLU-MEDROL) injection  40 mg Intravenous Daily   oxyCODONE  15 mg Per Tube Q6H   pantoprazole (PROTONIX) IV  40 mg Intravenous QHS   polyethylene glycol  17 g Per Tube BID   QUEtiapine  100 mg Per Tube BID   revefenacin  175 mcg Nebulization Daily   rosuvastatin  20 mg Per Tube Daily   senna-docusate  1 tablet Per Tube BID   sodium chloride flush  10-40 mL Intracatheter Q12H   sodium chloride flush  3 mL Intravenous Q12H   valproic acid  250 mg Per Tube TID    Infusions:  sodium chloride     sodium chloride 10 mL/hr at 07/01/2021 1502   sodium chloride     sodium chloride 10 mL/hr at 06/26/21 0600   sodium chloride     bivalirudin (ANGIOMAX) infusion 0.5 mg/mL (Non-ACS indications) 0.24 mg/kg/hr (06/26/21 0600)   ceFEPime (MAXIPIME) IV Stopped (  06/26/21 0536)   dexmedetomidine (PRECEDEX) IV infusion 1 mcg/kg/hr (06/26/21 0600)   feeding supplement (PIVOT 1.5 CAL) 1,000 mL  (06/26/21 0450)   HYDROmorphone 3 mg/hr (06/26/21 0600)   insulin 11 Units/hr (06/26/21 0600)   norepinephrine (LEVOPHED) Adult infusion 18 mcg/min (06/26/21 0600)    PRN Medications: sodium chloride, Place/Maintain arterial line **AND** sodium chloride, acetaminophen **OR** acetaminophen, albuterol, bisacodyl, dextrose, docusate, HYDROmorphone, ondansetron **OR** ondansetron (ZOFRAN) IV, sodium chloride flush, sodium chloride flush   Assessment/Plan   1. Acute hypoxemic and hypercarbic respiratory failure: In setting of COPD on home oxygen at baseline.  He has severe influenza infection.  Refractory hypoxemia and hypercarbia with maximal management.  He has underlying significant lung disease but has reasonable quality of life and had ARDS in the past requiring tracheostomy but was able to wean off.  RESP score was acceptable.  VV ECMO cannulation on 11/7.  Pseudomonas and MSSA in trach aspirate from 11/7.  Tracheostomy 11/10.  This morning, lactate is 0.9.  ABG 7.28/66/76/92%.  Sweep at 2, stable overnight.  LDH stable 333.  Good UOP with IV Lasix but net even due to high input.  BUN now markedly elevated.  Need negative I/Os, volume status is one of the few variables we can actively change here.  - Continue incremental/gradual sweep wean => accept pH as low as 7.25 if patient tolerates (gas this morning acceptable).  - Bivalirudin for PTT 60-80.  - Give Lasix 60 mg IV every 8 hrs again today, will not increase with rising BUN. I am concerned that to get fluid off we are going to need CVVH.  - Will limit fluid coming in (transition amiodarone to po).  - Antibiotic coverage with cefepime - Solumedrol IV  - Anticipate prolonged course.  2. Atrial fibrillation: He has been in atrial fibrillation on all ECGs since 2021, suspect chronic at this point. On Eliquis at home. Soft BP has limited metoprolol.  - Will manage on bivalirudin.  - Transition amiodarone to po to limit fluid intake.    -  Consider cardioversion in future.  3.  AKI: Creatinine 1.6 => 2.37 => 1.85 => 1.63 => 1.27 => 1.31 => 1.43 => 1.64 => 1.6 => 1.81 but now BUN up to 117.    - Lasix 60 mg IV every 8 hrs today, will not increase with high BUN.  - Suspect that he will need to transition to CVVH soon.  4. Type 2 DM: Still on insulin gtt.  5. Influenza: Severe.  Completed oseltamivir.  6. COPD: Still smokes 1-2 cigs/day.  Intermittent home oxygen.  Followed by pulmonary, has been stable at home for years.  7. HIV: Last viral load was undetectable.  On treatment.  8. Shock: Suspect primarily septic shock.  Echo showed EF 55-60% with normal RV. NE up to 18 this morning.  Still ha  9. FEN: Tube feeds.  10. Heme: Had 2 units PRBCs 11/9.  1 unit PRBCs on 11/15.  Hgb 7.7 today. No overt bleeding.   - Transfuse < 7.5  11. Hypernatremia: Na 148 today.  - Continue current free water boluses.    CRITICAL CARE Performed by: Loralie Champagne  Total critical care time: 40 minutes  Critical care time was exclusive of separately billable procedures and treating other patients.  Critical care was necessary to treat or prevent imminent or life-threatening deterioration.  Critical care was time spent personally by me on the following activities: development of treatment plan with patient and/or surrogate as well  as nursing, discussions with consultants, evaluation of patient's response to treatment, examination of patient, obtaining history from patient or surrogate, ordering and performing treatments and interventions, ordering and review of laboratory studies, ordering and review of radiographic studies, pulse oximetry and re-evaluation of patient's condition.     Length of Stay: [place], MD  06/26/2021, 7:02 AM  Advanced Heart Failure Team Pager [phone] (M-F; 7a - 5p)  Please contact [facility] for night-coverage after hours (5p -7a ) and weekends on [email or web address]

## 2021-06-26 NOTE — Consult Note (Addendum)
WOC Nurse Consult Note: Reason for Consult: Consult requested for buttocks.  Pt's forehead with 2 darker-colored areas of skin which might be related to a strap across his head or a previous pulse ox; these are resolving and are not pressure injuries.  Wound type: Right buttock with 2 full thickness wounds which are related to previously reported blisters which have ruptured, these are not pressure injuries. 1X1X.2cm and 2X5X.2cm.  Both are red and moist. Pt is on a low airloss mattress to reduce pressure.  Dressing procedure/placement/frequency: Topical treatment orders provided for bedside nurses to perform as follows to protect and promote healing: Foam dressing to buttocks, change Q 3 days or PRN soiling. Please re-consult if further assistance is needed.  Thank-you,  Cammie Mcgee MSN, RN, CWOCN, [place], CNS [phone]

## 2021-06-27 ENCOUNTER — Inpatient Hospital Stay (HOSPITAL_COMMUNITY): Payer: Medicare HMO

## 2021-06-27 ENCOUNTER — Encounter (HOSPITAL_COMMUNITY): Admission: EM | Disposition: E | Payer: Self-pay | Source: Home / Self Care | Attending: Pulmonary Disease

## 2021-06-27 ENCOUNTER — Encounter (HOSPITAL_COMMUNITY): Payer: Self-pay | Admitting: Internal Medicine

## 2021-06-27 DIAGNOSIS — J9622 Acute and chronic respiratory failure with hypercapnia: Secondary | ICD-10-CM | POA: Diagnosis not present

## 2021-06-27 DIAGNOSIS — Z515 Encounter for palliative care: Secondary | ICD-10-CM | POA: Diagnosis not present

## 2021-06-27 DIAGNOSIS — I4891 Unspecified atrial fibrillation: Secondary | ICD-10-CM | POA: Diagnosis not present

## 2021-06-27 DIAGNOSIS — J9621 Acute and chronic respiratory failure with hypoxia: Secondary | ICD-10-CM | POA: Diagnosis not present

## 2021-06-27 DIAGNOSIS — K921 Melena: Secondary | ICD-10-CM | POA: Diagnosis not present

## 2021-06-27 DIAGNOSIS — J101 Influenza due to other identified influenza virus with other respiratory manifestations: Secondary | ICD-10-CM | POA: Diagnosis not present

## 2021-06-27 DIAGNOSIS — K922 Gastrointestinal hemorrhage, unspecified: Secondary | ICD-10-CM | POA: Diagnosis not present

## 2021-06-27 DIAGNOSIS — Z7189 Other specified counseling: Secondary | ICD-10-CM | POA: Diagnosis not present

## 2021-06-27 DIAGNOSIS — Z9911 Dependence on respirator [ventilator] status: Secondary | ICD-10-CM | POA: Diagnosis not present

## 2021-06-27 DIAGNOSIS — J441 Chronic obstructive pulmonary disease with (acute) exacerbation: Secondary | ICD-10-CM | POA: Diagnosis not present

## 2021-06-27 DIAGNOSIS — J151 Pneumonia due to Pseudomonas: Secondary | ICD-10-CM

## 2021-06-27 HISTORY — PX: SCLEROTHERAPY: SHX6841

## 2021-06-27 HISTORY — PX: HOT HEMOSTASIS: SHX5433

## 2021-06-27 HISTORY — PX: ESOPHAGOGASTRODUODENOSCOPY: SHX5428

## 2021-06-27 LAB — POCT I-STAT 7, (LYTES, BLD GAS, ICA,H+H)
Acid-Base Excess: 8 mmol/L — ABNORMAL HIGH (ref 0.0–2.0)
Acid-Base Excess: 7 mmol/L — ABNORMAL HIGH (ref 0.0–2.0)
Acid-Base Excess: 7 mmol/L — ABNORMAL HIGH (ref 0.0–2.0)
Acid-Base Excess: 6 mmol/L — ABNORMAL HIGH (ref 0.0–2.0)
Acid-Base Excess: 7 mmol/L — ABNORMAL HIGH (ref 0.0–2.0)
Acid-Base Excess: 7 mmol/L — ABNORMAL HIGH (ref 0.0–2.0)
Acid-Base Excess: 8 mmol/L — ABNORMAL HIGH (ref 0.0–2.0)
Acid-Base Excess: 11 mmol/L — ABNORMAL HIGH (ref 0.0–2.0)
Bicarbonate: 34.5 mmol/L — ABNORMAL HIGH (ref 20.0–28.0)
Bicarbonate: 34.1 mmol/L — ABNORMAL HIGH (ref 20.0–28.0)
Bicarbonate: 34.3 mmol/L — ABNORMAL HIGH (ref 20.0–28.0)
Bicarbonate: 33.9 mmol/L — ABNORMAL HIGH (ref 20.0–28.0)
Bicarbonate: 35 mmol/L — ABNORMAL HIGH (ref 20.0–28.0)
Bicarbonate: 34.7 mmol/L — ABNORMAL HIGH (ref 20.0–28.0)
Bicarbonate: 34.6 mmol/L — ABNORMAL HIGH (ref 20.0–28.0)
Bicarbonate: 36.7 mmol/L — ABNORMAL HIGH (ref 20.0–28.0)
Calcium, Ion: 1.29 mmol/L (ref 1.15–1.40)
Calcium, Ion: 1.26 mmol/L (ref 1.15–1.40)
Calcium, Ion: 1.25 mmol/L (ref 1.15–1.40)
Calcium, Ion: 1.26 mmol/L (ref 1.15–1.40)
Calcium, Ion: 1.26 mmol/L (ref 1.15–1.40)
Calcium, Ion: 1.27 mmol/L (ref 1.15–1.40)
Calcium, Ion: 1.24 mmol/L (ref 1.15–1.40)
Calcium, Ion: 1.26 mmol/L (ref 1.15–1.40)
HCT: 19 % — ABNORMAL LOW (ref 39.0–52.0)
HCT: 23 % — ABNORMAL LOW (ref 39.0–52.0)
HCT: 22 % — ABNORMAL LOW (ref 39.0–52.0)
HCT: 22 % — ABNORMAL LOW (ref 39.0–52.0)
HCT: 25 % — ABNORMAL LOW (ref 39.0–52.0)
HCT: 26 % — ABNORMAL LOW (ref 39.0–52.0)
HCT: 26 % — ABNORMAL LOW (ref 39.0–52.0)
HCT: 26 % — ABNORMAL LOW (ref 39.0–52.0)
Hemoglobin: 6.5 g/dL — CL (ref 13.0–17.0)
Hemoglobin: 7.8 g/dL — ABNORMAL LOW (ref 13.0–17.0)
Hemoglobin: 7.5 g/dL — ABNORMAL LOW (ref 13.0–17.0)
Hemoglobin: 7.5 g/dL — ABNORMAL LOW (ref 13.0–17.0)
Hemoglobin: 8.5 g/dL — ABNORMAL LOW (ref 13.0–17.0)
Hemoglobin: 8.8 g/dL — ABNORMAL LOW (ref 13.0–17.0)
Hemoglobin: 8.8 g/dL — ABNORMAL LOW (ref 13.0–17.0)
Hemoglobin: 8.8 g/dL — ABNORMAL LOW (ref 13.0–17.0)
O2 Saturation: 94 %
O2 Saturation: 92 %
O2 Saturation: 93 %
O2 Saturation: 91 %
O2 Saturation: 91 %
O2 Saturation: 93 %
O2 Saturation: 97 %
O2 Saturation: 99 %
Patient temperature: 99.1
Patient temperature: 36.8
Patient temperature: 36.9
Patient temperature: 36.8
Patient temperature: 36.8
Patient temperature: 36.9
Patient temperature: 36.8
Patient temperature: 37.3
Potassium: 4.2 mmol/L (ref 3.5–5.1)
Potassium: 4.3 mmol/L (ref 3.5–5.1)
Potassium: 4.2 mmol/L (ref 3.5–5.1)
Potassium: 4.1 mmol/L (ref 3.5–5.1)
Potassium: 4.3 mmol/L (ref 3.5–5.1)
Potassium: 4.6 mmol/L (ref 3.5–5.1)
Potassium: 4.9 mmol/L (ref 3.5–5.1)
Potassium: 4.6 mmol/L (ref 3.5–5.1)
Sodium: 153 mmol/L — ABNORMAL HIGH (ref 135–145)
Sodium: 154 mmol/L — ABNORMAL HIGH (ref 135–145)
Sodium: 154 mmol/L — ABNORMAL HIGH (ref 135–145)
Sodium: 156 mmol/L — ABNORMAL HIGH (ref 135–145)
Sodium: 155 mmol/L — ABNORMAL HIGH (ref 135–145)
Sodium: 154 mmol/L — ABNORMAL HIGH (ref 135–145)
Sodium: 154 mmol/L — ABNORMAL HIGH (ref 135–145)
Sodium: 154 mmol/L — ABNORMAL HIGH (ref 135–145)
TCO2: 36 mmol/L — ABNORMAL HIGH (ref 22–32)
TCO2: 36 mmol/L — ABNORMAL HIGH (ref 22–32)
TCO2: 36 mmol/L — ABNORMAL HIGH (ref 22–32)
TCO2: 36 mmol/L — ABNORMAL HIGH (ref 22–32)
TCO2: 37 mmol/L — ABNORMAL HIGH (ref 22–32)
TCO2: 37 mmol/L — ABNORMAL HIGH (ref 22–32)
TCO2: 36 mmol/L — ABNORMAL HIGH (ref 22–32)
TCO2: 38 mmol/L — ABNORMAL HIGH (ref 22–32)
pCO2 arterial: 67.8 mmHg (ref 32.0–48.0)
pCO2 arterial: 71.5 mmHg (ref 32.0–48.0)
pCO2 arterial: 71.2 mmHg (ref 32.0–48.0)
pCO2 arterial: 71.2 mmHg (ref 32.0–48.0)
pCO2 arterial: 70.7 mmHg (ref 32.0–48.0)
pCO2 arterial: 71.5 mmHg (ref 32.0–48.0)
pCO2 arterial: 58.8 mmHg — ABNORMAL HIGH (ref 32.0–48.0)
pCO2 arterial: 57.2 mmHg — ABNORMAL HIGH (ref 32.0–48.0)
pH, Arterial: 7.316 — ABNORMAL LOW (ref 7.350–7.450)
pH, Arterial: 7.285 — ABNORMAL LOW (ref 7.350–7.450)
pH, Arterial: 7.29 — ABNORMAL LOW (ref 7.350–7.450)
pH, Arterial: 7.284 — ABNORMAL LOW (ref 7.350–7.450)
pH, Arterial: 7.302 — ABNORMAL LOW (ref 7.350–7.450)
pH, Arterial: 7.293 — ABNORMAL LOW (ref 7.350–7.450)
pH, Arterial: 7.376 (ref 7.350–7.450)
pH, Arterial: 7.417 (ref 7.350–7.450)
pO2, Arterial: 79 mmHg — ABNORMAL LOW (ref 83.0–108.0)
pO2, Arterial: 74 mmHg — ABNORMAL LOW (ref 83.0–108.0)
pO2, Arterial: 77 mmHg — ABNORMAL LOW (ref 83.0–108.0)
pO2, Arterial: 69 mmHg — ABNORMAL LOW (ref 83.0–108.0)
pO2, Arterial: 70 mmHg — ABNORMAL LOW (ref 83.0–108.0)
pO2, Arterial: 77 mmHg — ABNORMAL LOW (ref 83.0–108.0)
pO2, Arterial: 90 mmHg (ref 83.0–108.0)
pO2, Arterial: 145 mmHg — ABNORMAL HIGH (ref 83.0–108.0)

## 2021-06-27 LAB — BPAM RBC
Blood Product Expiration Date: 202211212359
Blood Product Expiration Date: 202211212359
Blood Product Expiration Date: 202211212359
Blood Product Expiration Date: 202211242359
Blood Product Expiration Date: 202211272359
Blood Product Expiration Date: 202212022359
ISSUE DATE / TIME: 202211131952
ISSUE DATE / TIME: 202211151736
ISSUE DATE / TIME: 202211161654
ISSUE DATE / TIME: 202211170538
Unit Type and Rh: 6200
Unit Type and Rh: 6200
Unit Type and Rh: 6200
Unit Type and Rh: 6200
Unit Type and Rh: 6200
Unit Type and Rh: 6200

## 2021-06-27 LAB — GLUCOSE, CAPILLARY
Glucose-Capillary: 107 mg/dL — ABNORMAL HIGH (ref 70–99)
Glucose-Capillary: 112 mg/dL — ABNORMAL HIGH (ref 70–99)
Glucose-Capillary: 114 mg/dL — ABNORMAL HIGH (ref 70–99)
Glucose-Capillary: 117 mg/dL — ABNORMAL HIGH (ref 70–99)
Glucose-Capillary: 118 mg/dL — ABNORMAL HIGH (ref 70–99)
Glucose-Capillary: 120 mg/dL — ABNORMAL HIGH (ref 70–99)
Glucose-Capillary: 120 mg/dL — ABNORMAL HIGH (ref 70–99)
Glucose-Capillary: 127 mg/dL — ABNORMAL HIGH (ref 70–99)
Glucose-Capillary: 134 mg/dL — ABNORMAL HIGH (ref 70–99)
Glucose-Capillary: 149 mg/dL — ABNORMAL HIGH (ref 70–99)
Glucose-Capillary: 151 mg/dL — ABNORMAL HIGH (ref 70–99)
Glucose-Capillary: 156 mg/dL — ABNORMAL HIGH (ref 70–99)
Glucose-Capillary: 157 mg/dL — ABNORMAL HIGH (ref 70–99)
Glucose-Capillary: 164 mg/dL — ABNORMAL HIGH (ref 70–99)
Glucose-Capillary: 178 mg/dL — ABNORMAL HIGH (ref 70–99)
Glucose-Capillary: 180 mg/dL — ABNORMAL HIGH (ref 70–99)

## 2021-06-27 LAB — TYPE AND SCREEN
ABO/RH(D): A POS
Antibody Screen: NEGATIVE
Unit division: 0
Unit division: 0
Unit division: 0
Unit division: 0
Unit division: 0
Unit division: 0

## 2021-06-27 LAB — BASIC METABOLIC PANEL
Anion gap: 3 — ABNORMAL LOW (ref 5–15)
Anion gap: 5 (ref 5–15)
BUN: 120 mg/dL — ABNORMAL HIGH (ref 6–20)
BUN: 119 mg/dL — ABNORMAL HIGH (ref 6–20)
CO2: 33 mmol/L — ABNORMAL HIGH (ref 22–32)
CO2: 33 mmol/L — ABNORMAL HIGH (ref 22–32)
Calcium: 8.3 mg/dL — ABNORMAL LOW (ref 8.9–10.3)
Calcium: 8.3 mg/dL — ABNORMAL LOW (ref 8.9–10.3)
Chloride: 115 mmol/L — ABNORMAL HIGH (ref 98–111)
Chloride: 113 mmol/L — ABNORMAL HIGH (ref 98–111)
Creatinine, Ser: 1.69 mg/dL — ABNORMAL HIGH (ref 0.61–1.24)
Creatinine, Ser: 1.77 mg/dL — ABNORMAL HIGH (ref 0.61–1.24)
GFR, Estimated: 47 mL/min — ABNORMAL LOW (ref >60)
GFR, Estimated: 45 mL/min — ABNORMAL LOW (ref >60)
Glucose, Bld: 161 mg/dL — ABNORMAL HIGH (ref 70–99)
Glucose, Bld: 116 mg/dL — ABNORMAL HIGH (ref 70–99)
Potassium: 4.2 mmol/L (ref 3.5–5.1)
Potassium: 4.6 mmol/L (ref 3.5–5.1)
Sodium: 151 mmol/L — ABNORMAL HIGH (ref 135–145)
Sodium: 151 mmol/L — ABNORMAL HIGH (ref 135–145)

## 2021-06-27 LAB — APTT
aPTT: 62 seconds — ABNORMAL HIGH (ref 24–36)
aPTT: 34 seconds (ref 24–36)
aPTT: 56 seconds — ABNORMAL HIGH (ref 24–36)

## 2021-06-27 LAB — CBC
HCT: 22.4 % — ABNORMAL LOW (ref 39.0–52.0)
HCT: 25.4 % — ABNORMAL LOW (ref 39.0–52.0)
Hemoglobin: 6.8 g/dL — CL (ref 13.0–17.0)
Hemoglobin: 7.7 g/dL — ABNORMAL LOW (ref 13.0–17.0)
MCH: 29.6 pg (ref 26.0–34.0)
MCH: 28.8 pg (ref 26.0–34.0)
MCHC: 30.4 g/dL (ref 30.0–36.0)
MCHC: 30.3 g/dL (ref 30.0–36.0)
MCV: 97.4 fL (ref 80.0–100.0)
MCV: 95.1 fL (ref 80.0–100.0)
Platelets: 156 10*3/uL (ref 150–400)
Platelets: 151 10*3/uL (ref 150–400)
RBC: 2.3 MIL/uL — ABNORMAL LOW (ref 4.22–5.81)
RBC: 2.67 MIL/uL — ABNORMAL LOW (ref 4.22–5.81)
RDW: 18.4 % — ABNORMAL HIGH (ref 11.5–15.5)
RDW: 19.8 % — ABNORMAL HIGH (ref 11.5–15.5)
WBC: 26.6 10*3/uL — ABNORMAL HIGH (ref 4.0–10.5)
WBC: 29.9 10*3/uL — ABNORMAL HIGH (ref 4.0–10.5)
nRBC: 3.6 % — ABNORMAL HIGH (ref 0.0–0.2)
nRBC: 6 % — ABNORMAL HIGH (ref 0.0–0.2)

## 2021-06-27 LAB — PROTIME-INR
INR: 2.1 — ABNORMAL HIGH (ref 0.8–1.2)
Prothrombin Time: 23.1 seconds — ABNORMAL HIGH (ref 11.4–15.2)

## 2021-06-27 LAB — OCCULT BLOOD X 1 CARD TO LAB, STOOL: Fecal Occult Bld: POSITIVE — AB

## 2021-06-27 LAB — HEPATIC FUNCTION PANEL
ALT: 65 U/L — ABNORMAL HIGH (ref 0–44)
AST: 53 U/L — ABNORMAL HIGH (ref 15–41)
Albumin: 1.5 g/dL — ABNORMAL LOW (ref 3.5–5.0)
Alkaline Phosphatase: 42 U/L (ref 38–126)
Bilirubin, Direct: <0.1 mg/dL (ref 0.0–0.2)
Total Bilirubin: 0.6 mg/dL (ref 0.3–1.2)
Total Protein: 5.2 g/dL — ABNORMAL LOW (ref 6.5–8.1)

## 2021-06-27 LAB — PREPARE RBC (CROSSMATCH)

## 2021-06-27 LAB — LACTIC ACID, PLASMA: Lactic Acid, Venous: 0.6 mmol/L (ref 0.5–1.9)

## 2021-06-27 LAB — FIBRINOGEN: Fibrinogen: 568 mg/dL — ABNORMAL HIGH (ref 210–475)

## 2021-06-27 LAB — LACTATE DEHYDROGENASE: LDH: 287 U/L — ABNORMAL HIGH (ref 98–192)

## 2021-06-27 SURGERY — EGD (ESOPHAGOGASTRODUODENOSCOPY)
Anesthesia: Moderate Sedation

## 2021-06-27 MED ORDER — SODIUM CHLORIDE 0.9% IV SOLUTION: Freq: Once | INTRAVENOUS | Status: AC

## 2021-06-27 MED ORDER — SODIUM CHLORIDE 0.9 % IV SOLN
0.1800 mg/kg/h | INTRAVENOUS | Status: DC
  Administered 2021-06-27 – 2021-06-29 (×4): 0.18 mg/kg/h via INTRAVENOUS
  Filled 2021-06-27 (×5): qty 250

## 2021-06-27 MED ORDER — VANCOMYCIN HCL 1250 MG/250ML IV SOLN: 1250.0000 mg | INTRAVENOUS | Status: DC

## 2021-06-27 MED ORDER — PANTOPRAZOLE SODIUM 40 MG IV SOLR
40.0000 mg | Freq: Two times a day (BID) | INTRAVENOUS | Status: DC
  Administered 2021-06-27 – 2021-07-05 (×17): 40 mg via INTRAVENOUS
  Filled 2021-06-27 (×17): qty 40

## 2021-06-27 MED ORDER — VANCOMYCIN HCL 750 MG/150ML IV SOLN
750.0000 mg | Freq: Two times a day (BID) | INTRAVENOUS | Status: DC
Start: 2021-06-27 — End: 2021-06-28
  Administered 2021-06-27: 21:00:00 750 mg via INTRAVENOUS
  Filled 2021-06-27 (×2): qty 150

## 2021-06-27 MED ORDER — ROCURONIUM BROMIDE 50 MG/5ML IV SOLN
100.0000 mg | Freq: Once | INTRAVENOUS | Status: DC | PRN
  Filled 2021-06-27: qty 10

## 2021-06-27 MED ORDER — MIDAZOLAM HCL 2 MG/2ML IJ SOLN
4.0000 mg | INTRAMUSCULAR | Status: DC | PRN
  Filled 2021-06-27: qty 4

## 2021-06-27 MED ORDER — VANCOMYCIN HCL 1250 MG/250ML IV SOLN
1250.0000 mg | INTRAVENOUS | Status: DC
Start: 2021-06-28 — End: 2021-06-27

## 2021-06-27 MED ORDER — ROCURONIUM BROMIDE 10 MG/ML (PF) SYRINGE
PREFILLED_SYRINGE | INTRAVENOUS | Status: AC
  Filled 2021-06-27: qty 10

## 2021-06-27 MED ORDER — SODIUM CHLORIDE 0.9 % IV SOLN: INTRAVENOUS | Status: DC

## 2021-06-27 MED ORDER — VANCOMYCIN HCL 10 G IV SOLR
2500.0000 mg | Freq: Once | INTRAVENOUS | Status: AC
  Administered 2021-06-27: 10:00:00 2500 mg via INTRAVENOUS
  Filled 2021-06-27: qty 2500

## 2021-06-27 MED ORDER — FUROSEMIDE 10 MG/ML IJ SOLN
60.0000 mg | Freq: Three times a day (TID) | INTRAMUSCULAR | Status: AC
  Administered 2021-06-27 (×3): 60 mg via INTRAVENOUS
  Filled 2021-06-27 (×3): qty 6

## 2021-06-27 MED ORDER — SODIUM CHLORIDE (PF) 0.9 % IJ SOLN
PREFILLED_SYRINGE | INTRAMUSCULAR | Status: DC | PRN
  Administered 2021-06-27: 16:00:00 3 mL

## 2021-06-27 NOTE — Progress Notes (Signed)
ANTICOAGULATION CONSULT NOTE   Pharmacy Consult for Bivalirudin Indication:  ECMO and atrial fibrillation  Allergies  Allergen Reactions   Bactrim [Sulfamethoxazole-Trimethoprim] Hives    Patient Measurements: Height: 5\' 9"  (175.3 cm) Weight: 117 kg (257 lb 15 oz) IBW/kg (Calculated) : 70.7  Vital Signs: Temp: 98.4 F (36.9 C) (11/17 1200) Temp Source: Core (11/17 1200) BP: 99/51 (11/17 1545) Pulse Rate: 103 (11/17 1545)  Labs: Recent Labs    06/25/21 0210 06/25/21 0215 06/26/21 0349 06/26/21 0357 06/26/21 1600 06/26/21 1603 07/06/2021 0343 07/04/2021 0352 06/19/2021 1001 06/19/2021 1119 06/18/2021 1221  HGB 7.9*   < > 7.7*   < > 6.7*   < > 6.8*   < > 7.5* 7.5* 8.5*  HCT 26.6*   < > 25.6*   < > 22.7*   < > 22.4*   < > 22.0* 22.0* 25.0*  PLT 145*   < > 151  --  157  --  156  --   --   --   --   APTT 76*   < > 74*  --  65*  --  62*  --   --   --   --   LABPROT 23.1*  --  23.6*  --   --   --  23.1*  --   --   --   --   INR 2.0*  --  2.1*  --   --   --  2.1*  --   --   --   --   CREATININE 1.61*   < > 1.81*  --  1.83*  --  1.69*  --   --   --   --    < > = values in this interval not displayed.     Estimated Creatinine Clearance: 62.3 mL/min (A) (by C-G formula based on SCr of 1.69 mg/dL (H)).   Medical History: Past Medical History:  Diagnosis Date   Anxiety    Atrial fibrillation (HCC)    CHF (congestive heart failure) (HCC)    COPD (chronic obstructive pulmonary disease) (HCC)    Emphysema [[name]]   Depression    Diabetes mellitus without complication (HCC)    type 2   Emphysema of lung (HCC)    GERD (gastroesophageal reflux disease)    HIV disease (HCC)    Hypertension    Oxygen deficiency     Assessment: 55 Y/O male on chronic apixaban for atrial fibrillation. Previous placed on heparin in anticipation of line placement and possible procedures. Last apixaban dose given @0948  11/7. Patient cannulated and ECMO initiated 11/7; received a heparin bolus at  cannulation, then switched to bivalrudin infusion. Pharmacy to dose bivalirudin.  Bivalirudin held this afternoon for bleeding and occult + stool.  Colonoscopy + bleeding AVM> clipped Per GI ok to resume bivalirudin Will start lower than last therapeutic rate  Goal of Therapy:  aPTT 50-70 secs Monitor platelets by anticoagulation protocol: Yes   Plan:  Resume bivalirudin at 0.18 mg/kg/hr  Aptt  in 4hour after start  Monitor q12h Aptt and CBC, s/sx bleeding Continue to trend Hgb after transfusion   13/7 Pharm.D. CPP, BCPS Clinical Pharmacist [phone] 06/26/2021 4:07 PM    Please check [email or web address] for unit-specific pharmacy phone numbers.

## 2021-06-27 NOTE — Progress Notes (Addendum)
ANTICOAGULATION CONSULT NOTE   Pharmacy Consult for Bivalirudin Indication:  ECMO and atrial fibrillation  Allergies  Allergen Reactions   Bactrim [Sulfamethoxazole-Trimethoprim] Hives    Patient Measurements: Height: 5\' 9"  (175.3 cm) Weight: 117 kg (257 lb 15 oz) IBW/kg (Calculated) : 70.7  Vital Signs: Temp: 98.8 F (37.1 C) (11/17 0000) Temp Source: Core (11/17 0000) BP: 121/50 (11/17 0000)  Labs: Recent Labs    06/25/21 0210 06/25/21 0215 06/26/21 0349 06/26/21 0357 06/26/21 1600 06/26/21 1603 06/26/21 1947 07/08/2021 0343 06/23/2021 0352  HGB 7.9*   < > 7.7*   < > 6.7*   < > 7.1* 6.8* 6.5*  HCT 26.6*   < > 25.6*   < > 22.7*   < > 21.0* 22.4* 19.0*  PLT 145*   < > 151  --  157  --   --  156  --   APTT 76*   < > 74*  --  65*  --   --  62*  --   LABPROT 23.1*  --  23.6*  --   --   --   --  23.1*  --   INR 2.0*  --  2.1*  --   --   --   --  2.1*  --   CREATININE 1.61*   < > 1.81*  --  1.83*  --   --  1.69*  --    < > = values in this interval not displayed.     Estimated Creatinine Clearance: 62.3 mL/min (A) (by C-G formula based on SCr of 1.69 mg/dL (H)).   Medical History: Past Medical History:  Diagnosis Date   Anxiety    Atrial fibrillation (HCC)    CHF (congestive heart failure) (HCC)    COPD (chronic obstructive pulmonary disease) (HCC)    Emphysema [[name]]   Depression    Diabetes mellitus without complication (HCC)    type 2   Emphysema of lung (HCC)    GERD (gastroesophageal reflux disease)    HIV disease (HCC)    Hypertension    Oxygen deficiency     Assessment: 55 Y/O male on chronic apixaban for atrial fibrillation. Previous placed on heparin in anticipation of line placement and possible procedures. Last apixaban dose given @0948  11/7. Patient cannulated and ECMO initiated 11/7; received a heparin bolus at cannulation, then switched to bivalrudin infusion. Pharmacy to dose bivalirudin.  aPTT 62 sec is therapeutic on 0.24 mg/kg/hr. Hbg of  6.5 still low after 1 unit pRBC infused yesterday evening (transfusing another unit for Hgb <7), plt wnl. No bleeding related issues noted. No pump issues reported.  Goal of Therapy:  aPTT 50-70 secs Monitor platelets by anticoagulation protocol: Yes   Plan:  Continue bivalirudin at 0.24 mg/kg/hr  Monitor q12h Aptt and CBC, s/sx bleeding Continue to trend Hgb after transfusion  13/7, PharmD PGY1 Pharmacy Resident 07/10/2021  5:31 AM  Please check [email or web address] for unit-specific pharmacy phone numbers.

## 2021-06-27 NOTE — Progress Notes (Signed)
NAME:  Chris [name]., MRN:  [id number], DOB:  [birth year], LOS: 14 ADMISSION DATE:  07/02/2021, CONSULTATION DATE:  06/15/2019 REFERRING MD:  Gaynell Face, CHIEF COMPLAINT:  hypercapnic respiratory failure   History of Present Illness:   55 year old man with O2 dependent COPD presenting with acute on chronic respiratory failure now on VV ECMO due to refractory hypoxia and hypercapnia.  Pertinent  Medical History   Past Medical History:  Diagnosis Date   Anxiety    Atrial fibrillation (HCC)    CHF (congestive heart failure) (HCC)    COPD (chronic obstructive pulmonary disease) (HCC)    Emphysema [[name]]   Depression    Diabetes mellitus without complication (HCC)    type 2   Emphysema of lung (HCC)    GERD (gastroesophageal reflux disease)    HIV disease (HCC)    Hypertension    Oxygen deficiency     Significant Hospital Events: Including procedures, antibiotic start and stop dates in addition to other pertinent events   11/7- VV ECMO started 11/9 - weaning sedation. Transfused 2 units PRBC to keep O2 carrying capacity.  11/9 - remains agitated with wake up  11/10 - tracheostomy tube placed 11/14 trach exchanged to distal XLT due to cuff leak  Interim History / Subjective:  2 units pRBCs over the past day. No visible bleeding.   Objective   Blood pressure (!) 121/50, pulse (!) 119, temperature 98.8 F (37.1 C), temperature source Axillary, resp. rate (!) 30, height 5\' 9"  (1.753 m), weight 117 kg, SpO2 94 %.    Vent Mode: PCV FiO2 (%):  [50 %] 50 % Set Rate:  [18 bmp] 18 bmp PEEP:  [10 cmH20] 10 cmH20 Plateau Pressure:  [19 cmH20-31 cmH20] 31 cmH20   Intake/Output Summary (Last 24 hours) at 07/06/2021 0701 Last data filed at 06/29/2021 0600 Gross per 24 hour  Intake 6453.47 ml  Output 8625 ml  Net -2171.53 ml    Filed Weights   06/25/21 0429 06/26/21 0500 07/08/2021 0500  Weight: 119.4 kg 120.6 kg 117 kg    Examination: General: critically ill appearing man  lying in bed trached, sedated, on ecmo HEENT: [place]/AT, eyes anicteric Neck: no bleeding or drainage around trach, Shiley cuffed distal XLT #8 Resp: no wheezing or rhonchi, synchronous with MV Cardio: S1S2, irreg rhythm, reg rate Neuro: RASS -4, intact cough reflex, PERRL Extremities: improving edema, no cyanosis GU: yellow urine, foley Derm: warm, dry, no rashes or ecchymoses  I/O -2L, net +9.2L for admission  Ancillary tests:   7. 232/68/79/35 Na+  151 BUN 120 Cr 1.69 AST 56 ALT 65 LDH 287 LA 0.6 WBC 26.6 H/H 6.8/22.4 Platelets 156 INR 2.1 PTT 62 BG 130-170s Resp culture 11/13> few Pseudomonas> pansensitive   Assessment & Plan:   Acute on chronic hypoxic and hypercapnic respiratory failure from influenza pneumonia on background of severe COPD, prior tracheostomy. On HOT 10 years. Acute COPD exacerbation; severe underlying COPD- 2019 PFTs: FEV1 1.14L (38%), RV 173%, DLCO 36% ARDS-  P:F 100 pre-cannulation Pseudomonas and MSSA HAP Now trach dependent Concern for undiagnosed OSA -Con't full vent support- increase rate today -con't ECMO and sweep weaning trials -compelted tamiflu - Needs 14 days of antipseudomonal coverage.  Need test for cure> may require up to 3 months of therapy -Con't pulmicort, yupelri, brovana - Continue solumedrol; wouldn't wean until off ecmo - Lasix 60 3 times daily, reduce intake where able -Limited baseline functional status; active but requires frequent breaks.  Family  prepared for prolonged course and need for post-hospital rehabilitation.  Acute metabolic encephalopathy with severe agitation -Con't enteral seroquel, clonazepam, oxycodone, valproic acid -con't dilaudid-less volume than fentanyl -priority is ECMO weaning rather than rehabilitation on ECMO given baseline disability  HIV, undetectable VL -con't ART  Shock due to sedation; concern now for GIB -Norepi to maintain MAP >65; wean as tolerated.  Concern for GIB- Elevated BUN,  worsening hypotension that responds to transfusions,needed 2 transfusions in the past 24 hours -FOBT> if positive can consult GI -increase PPI to BID  Chronic Afib w/RVR -Amiodarone for rate control, con't PO - Con't vasopressin + NE - Continue to hold PTA Eliquis and diltiazem - Anticoagulation with bivalirudin while on ECMO; PTT 50-70  H/o HFpEF -con't aggressive diuresis  GERD -PPI increased to BID  Type 2 diabetes with hyperglycemia -con't IV insulin; wasn't absorbing [place] -goal BG 140-180  Hypernatremia from high insensitive losses -Continue free water flushes  Leukocytosis, worsened again-question if this could be related to blood product transfusion.  No obvious new source of sepsis. -repeat sputum pending, still growing pseudomonas -add vanc -blood cultures -Complete 2-week course of cefepime.  Needs retest trend sure he is cleared Pseudomonas. -repeat MRSA nares still negative this week -UA this week not suggestive of infection  Acute anemia -Transfuse for hemoglobin less than 7 or hemodynamically significant bleeding. -concern for GIB; increased PPI  Deconditioning, acute on chronic -prolonged recovery expected.  I anticipate he will not return to his former functional status.  Best Practice (right click and "Reselect all SmartList Selections" daily)   Diet/type: tubefeeds  DVT prophylaxis: bivalirudin GI prophylaxis: PPI Lines: Central line and Arterial Line Foley:  external catheter Code Status:  full code Last date of multidisciplinary goals of care discussion: continue for family updates    This patient is critically ill with multiple organ system failure which requires frequent high complexity decision making, assessment, support, evaluation, and titration of therapies. This was completed through the application of advanced monitoring technologies and extensive interpretation of multiple databases. During this encounter critical care time was devoted to  patient care services described in this note for 55 minutes.  Julian Hy, DO 06/15/2021 8:04 AM [facility]

## 2021-06-27 NOTE — Progress Notes (Addendum)
   Palliative Medicine Inpatient Follow Up Note  HPI: 55 y.o. male  with past medical history of atrial fibrillation, CHF, HTN, severe COPD, h/o ARDS requiring trach but has since been decannulated, diabetes, HIV admitted on 07/03/2021 with fever, shortness of breath, cough worsening over past 3 days. +Influenza A. Required intubation 06/21/2021 and cannulation for initiation of VV ECMO 07/06/21.   Palliative care was asked to get involved for Establishing goals of care; ECMO.  Noted past palliative care involvement with previous hospital stay in 2019 when he required trach and CRRT  Today's Discussion (06/20/2021):  *Please note that this is a verbal dictation therefore any spelling or grammatical errors are due to the "[employer]" system interpretation.  Chart reviewed.  Spoke to [employer], Alcario Drought. Louise remains to be agitated when trying to wean down on sedation. (+) leukocytosis today remains anemic and acidotic.   Plan for GI to get involved for anemia.   On exam, Kaidyn is ventilated through tracheostomy and sedated, appears to not be in distress.  Called patients spouse, Jocelyn Lamer to offer support. She shares with me that she is exhausted. She expresses that she recently injured her back and recovery has been difficult. Offered support through empathetic listening. She expressed planned presence for this evening. She remains hopeful for improvements but is also realistic    Objective Assessment: Vital Signs Vitals:   06/22/2021 1400 06/16/2021 1415  BP:    Pulse:    Resp: (!) 24 (!) 24  Temp:    SpO2: 97% 97%    Intake/Output Summary (Last 24 hours) at 06/22/2021 1431 Last data filed at 06/25/2021 1400 Gross per 24 hour  Intake 7863.48 ml  Output 8475 ml  Net -611.52 ml   Last Weight  Most recent update: 06/26/2021  5:00 AM    Weight  117 kg (257 lb 15 oz)            Gen:  Obese AA M chronically ill appearing HEENT: Dry mucous membranes, (+) tracheostomy CV: Irregular  rate and rhythm  PULM: Mechanical Ventilator ABD: (+) distended, high pitch bowel sounds heard best at LUQ JYN:[id number] edema Neuro: On sedation  SUMMARY OF RECOMMENDATIONS   Continue full scope with hopes of improvements  Ongoing incremental PMT support  Time Spent: 25 Greater than 50% of the time was spent in counseling and coordination of care ______________________________________________________________________________________ Lamarr Lulas [facility] Palliative Medicine Team Team Cell Phone: [phone] Please utilize secure chat with additional questions, if there is no response within 30 minutes please call the above phone number  Palliative Medicine Team providers are available by phone from 7am to 7pm daily and can be reached through the team cell phone.  Should this patient require assistance outside of these hours, please call the patient's attending physician.

## 2021-06-27 NOTE — Op Note (Signed)
EGD at bedside in ICU  Provation endoscopy writing software was completely nonfunctional on the travel cart and so this will serve as his official op report  Indication melena, anemia, hemodynamic instability Standard adult gastroscope was used Patient was already sedated and intubated via tracheostomy in the intensive care unit no extra sedation was necessary  Findings: Large amount of old blood clot in the stomach, nearly filling the proximal quarter of his stomach  There was an actively briskly oozing AVM at the very proximal aspect of the duodenal bulb.  The more distal duodenum was filled with more recent red blood clot.  I initially stop the bleeding with submucosal injection of epinephrine while we waited for APC cautery to arrive in the ICU.  I then applied APC cautery to the AVM, apparently destroying the lesion.  There was complete hemostasis by the end of the procedure.  The duodenum mucosa starting in the second portion of the duodenum was scalloped with multiple superficial ulcers.  The mucosa was otherwise very viable appearing.  This had a suggestion of ischemic damage to the small bowel.  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  Recommendation: I discussed with critical care team that I think it is probably safe to resume the blood thinner which is necessary for ECMO in 2 to 3 hours and he should be observed and transfused as needed.  If it is apparent that he is continuing to bleed then we may have to assess for further, more significant small bowel ischemic damage which would be unsurprising given his increasing pressor support need.  We will follow along

## 2021-06-27 NOTE — Consult Note (Addendum)
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 CHA2DS2-VASc score. Eliquis on hold.   4) Septic shock IV antibiotics on antibiotics IV and pressors   5) HIV positive  6) Hyperglycemia on insulin infusion     Noralyn Pick  06/14/2021, 12:58PM    ________________________________________________________________________  Velora Heckler GI MD note:  I personally examined the patient, reviewed the data and agree with the assessment and plan described above.  He is critically ill, on increasing pressor support. ECMO requires IV bloodthinner, that has been held for several hours now.  He has been transfused 8 units blood in about 8 days and has been having melenic stools for 2-3 days.  Seems most likely that his bleeding is from ischemic damage to the GI tract which is really not something we can fix endoscopically.  THere is a small chance that he has PUD or MW tear or AVM of the stomach that we are generally able to treat and so I think EGD is a reasonable next step, can be done bedside in the ICU probably without need for any  additional sedation.  I discussed this with his wife who consented to the procedure.   Owens Loffler, MD [facility] Pager [phone]

## 2021-06-27 NOTE — Progress Notes (Signed)
Patient ID: Chris Round., male   DOB: [birth year], 55 y.o.   MRN: [id number]     Advanced Heart Failure Rounding Note  PCP-Cardiologist: Glenetta Hew, MD   Subjective:    11/7: VV ECMO cannulation 11/10: Tracheostomy 11/14: Lurline Idol exchange  This morning, on NE 16 + vasopressin 0.03.  HR 100s in atrial fibrillation when calm.   Tm 99.1, on cefepime.  Trach aspirate from 11/7 with Pseudomonas and MSSA, 11/13 trach aspirate still with Pseudomonas.  Oseltamivir for influenza completed. Remains on Solumedrol for COPD.  AKI with creatinine 2.37 => 1.85 => 1.63 => 1.27 => 1.31 => 1.43 => 1.64 => 1.6 => 1.81 => 1.69 but now BUN up to 120.  Excellent UOP with negative I/Os finally, weight down.   FiO2 0.5 on vent. Sweep has been weaned to 1.5.    Suspect upper GI bleeding, has dark stool.  He had 2 units PRBCs overnight with hgb 6.8 this morning.   Patient wakes up with sedation wean, agitated.   TFs ongoing.   ECMO: 3500 rpm Flow 4.89 Pvenous -93 DeltaP 27 Sweep 1.5 ABG 7.32/68/79/94% PTT 74 (goal 60-80), on bivalirudin Lactate 0.6 LDH 323 => 356 => 310 => 278 => 262 => 300 => 407 => 341 => 333 => 287  Na 151, on free H20 boluses 300 q4hrs  CXR: Left effusion + atelectasis, stable cannula position   Objective:   Weight Range: 117 kg Body mass index is 38.09 kg/m.   Vital Signs:   Temp:  [98 F (36.7 C)-99.1 F (37.3 C)] 98 F (36.7 C) (11/17 0700) Pulse Rate:  [111-119] 119 (11/16 1605) Resp:  [14-36] 23 (11/17 0700) BP: (103-128)/(45-57) 121/50 (11/17 0000) SpO2:  [38 %-99 %] 94 % (11/17 0700) Arterial Line BP: (84-262)/(-26-61) 117/51 (11/17 0700) FiO2 (%):  [50 %] 50 % (11/17 0605) Weight:  [117 kg] 117 kg (11/17 0500) Last BM Date: 06/26/21 (flexiseal)  Weight change: Filed Weights   06/25/21 0429 06/26/21 0500 06/24/2021 0500  Weight: 119.4 kg 120.6 kg 117 kg    Intake/Output:   Intake/Output Summary (Last 24 hours) at 06/20/2021 0750 Last data  filed at 06/21/2021 0700 Gross per 24 hour  Intake 6568.47 ml  Output 8625 ml  Net -2056.53 ml      Physical Exam    General: NAD Neck: RIJ Crescent, LIJ CVL, no thyromegaly or thyroid nodule.  Lungs: Clear to auscultation bilaterally with normal respiratory effort. CV: Nondisplaced PMI.  Heart irregular S1/S2, no S3/S4, no murmur.  No peripheral edema.   Abdomen: Soft, nontender, no hepatosplenomegaly, no distention.  Skin: Intact without lesions or rashes.  Neurologic: Alert and oriented x 3.  Psych: Normal affect. Extremities: No clubbing or cyanosis.  HEENT: Normal.    Telemetry   Atrial fibrillation 100s (personally reviewed)  Labs    CBC Recent Labs    06/26/21 1600 06/26/21 1603 07/07/2021 0343 06/26/2021 0352  WBC 25.9*  --  26.6*  --   HGB 6.7*   < > 6.8* 6.5*  HCT 22.7*   < > 22.4* 19.0*  MCV 98.7  --  97.4  --   PLT 157  --  156  --    < > = values in this interval not displayed.   Basic Metabolic Panel Recent Labs    06/26/21 1600 06/26/21 1603 07/10/2021 0343 06/26/2021 0352  NA 149*   < > 151* 153*  K 4.4   < > 4.2 4.2  CL 112*  --  115*  --   CO2 31  --  33*  --   GLUCOSE 215*  --  161*  --   BUN 116*  --  120*  --   CREATININE 1.83*  --  1.69*  --   CALCIUM 8.2*  --  8.3*  --    < > = values in this interval not displayed.   Liver Function Tests Recent Labs    06/26/21 0349 06/26/2021 0343  AST 57* 53*  ALT 79* 65*  ALKPHOS 50 42  BILITOT 0.6 0.6  PROT 5.7* 5.2*  ALBUMIN 1.6* 1.5*   No results for input(s): LIPASE, AMYLASE in the last 72 hours. Cardiac Enzymes No results for input(s): CKTOTAL, CKMB, CKMBINDEX, TROPONINI in the last 72 hours.  BNP: BNP (last 3 results) Recent Labs    10/29/20 1214  BNP 102.9*    ProBNP (last 3 results) No results for input(s): PROBNP in the last 8760 hours.   D-Dimer No results for input(s): DDIMER in the last 72 hours. Hemoglobin A1C No results for input(s): HGBA1C in the last 72  hours. Fasting Lipid Panel No results for input(s): CHOL, HDL, LDLCALC, TRIG, CHOLHDL, LDLDIRECT in the last 72 hours.  Thyroid Function Tests No results for input(s): TSH, T4TOTAL, T3FREE, THYROIDAB in the last 72 hours.  Invalid input(s): FREET3  Other results:   Imaging    No results found.   Medications:     Scheduled Medications:  sodium chloride   Intravenous Once   amiodarone  200 mg Oral BID   arformoterol  15 mcg Nebulization BID   budesonide (PULMICORT) nebulizer solution  0.25 mg Nebulization BID   chlorhexidine gluconate (MEDLINE KIT)  15 mL Mouth Rinse BID   Chlorhexidine Gluconate Cloth  6 each Topical Daily   clonazePAM  1.5 mg Per Tube TID   emtricitabine-tenofovir AF  1 tablet Per Tube Daily   And   dolutegravir  50 mg Per Tube Daily   feeding supplement (PROSource TF)  45 mL Per Tube TID   free water  200 mL Per Tube Q4H   furosemide  60 mg Intravenous Q8H   mouth rinse  15 mL Mouth Rinse 10 times per day   methylPREDNISolone (SOLU-MEDROL) injection  40 mg Intravenous Daily   oxyCODONE  15 mg Per Tube Q6H   pantoprazole (PROTONIX) IV  40 mg Intravenous Q12H   polyethylene glycol  17 g Per Tube BID   QUEtiapine  100 mg Per Tube BID   revefenacin  175 mcg Nebulization Daily   rosuvastatin  20 mg Per Tube Daily   senna-docusate  1 tablet Per Tube BID   sodium chloride flush  10-40 mL Intracatheter Q12H   sodium chloride flush  3 mL Intravenous Q12H   valproic acid  250 mg Per Tube TID    Infusions:  sodium chloride     sodium chloride 10 mL/hr at 06/22/2021 1502   sodium chloride     sodium chloride Stopped (06/16/2021 0546)   sodium chloride     bivalirudin (ANGIOMAX) infusion 0.5 mg/mL (Non-ACS indications) 0.24 mg/kg/hr (06/14/2021 0600)   ceFEPime (MAXIPIME) IV Stopped (07/10/2021 0544)   dexmedetomidine (PRECEDEX) IV infusion 1.4 mcg/kg/hr (06/23/2021 0713)   feeding supplement (PIVOT 1.5 CAL) 70 mL/hr at 06/26/21 1400   HYDROmorphone 4 mg/hr  (07/08/2021 0600)   insulin 13 Units/hr (07/07/2021 0600)   norepinephrine (LEVOPHED) Adult infusion 16 mcg/min (07/04/2021 0714)   vasopressin 0.03 Units/min (06/18/2021 0600)    PRN  Medications: sodium chloride, Place/Maintain arterial line **AND** sodium chloride, acetaminophen **OR** acetaminophen, albuterol, bisacodyl, dextrose, docusate, HYDROmorphone, midazolam, ondansetron **OR** ondansetron (ZOFRAN) IV, sodium chloride flush, sodium chloride flush   Assessment/Plan   1. Acute hypoxemic and hypercarbic respiratory failure: In setting of COPD on home oxygen at baseline.  He has severe influenza infection.  Refractory hypoxemia and hypercarbia with maximal management.  He has underlying significant lung disease but has reasonable quality of life and had ARDS in the past requiring tracheostomy but was able to wean off.  RESP score was acceptable.  VV ECMO cannulation on 11/7.  Pseudomonas and MSSA in trach aspirate from 11/7.  Tracheostomy 11/10.  This morning, lactate is 0.6.  ABG 7.32/68/79/94%.  Sweep at 1.5, stable overnight.  LDH stable 287.  Good UOP with IV Lasix, finally net negative.  BUN now markedly elevated, may be due in part to UGIB.  Need negative I/Os, volume status is one of the few variables we can actively change here.  - Continue incremental/gradual sweep wean => accept pH as low as 7.25 if patient tolerates (gas this morning acceptable).  - Bivalirudin for PTT 50-80 (lower goal with UGIB).  - Give Lasix 60 mg IV every 8 hrs again today. - Antibiotic coverage with cefepime - Solumedrol IV  - Anticipate prolonged course.  2. Atrial fibrillation: He has been in atrial fibrillation on all ECGs since 2021, suspect chronic at this point. On Eliquis at home. Soft BP has limited metoprolol.  - Will manage on bivalirudin.  - Continue po amiodarone.    - Consider cardioversion in future.  3.  AKI: Creatinine 1.6 => 2.37 => 1.85 => 1.63 => 1.27 => 1.31 => 1.43 => 1.64 => 1.6 => 1.81 =>  1.69 but now BUN up to 120.  Suspect UGIB plays role in high BUN.  - Lasix 60 mg IV every 8 hrs today.  4. Type 2 DM: Still on insulin gtt.  5. Influenza: Severe.  Completed oseltamivir.  6. COPD: Still smokes 1-2 cigs/day.  Intermittent home oxygen.  Followed by pulmonary, has been stable at home for years but baseline DLCO was very low.  7. HIV: Last viral load was undetectable.  On treatment.  8. Shock: Suspect primarily septic shock.  Echo showed EF 55-60% with normal RV. This morning on NE 16, vasopressin 0.03. - Wean pressors as able.  9. FEN: Tube feeds.  10. GI: Dark stools, suspect upper GI bleeding.  Hgb 6.8 this morning.   Had 2 units overnight, just finished one. Likely contributing to elevated BUN.  - Will give another unit PRBCs.  - Increase PPI to bid.  - Decrease PTT goal to 50-80 11. Hypernatremia: Na 151 today.  - Continue current free water boluses.    CRITICAL CARE Performed by: Loralie Champagne  Total critical care time: 40 minutes  Critical care time was exclusive of separately billable procedures and treating other patients.  Critical care was necessary to treat or prevent imminent or life-threatening deterioration.  Critical care was time spent personally by me on the following activities: development of treatment plan with patient and/or surrogate as well as nursing, discussions with consultants, evaluation of patient's response to treatment, examination of patient, obtaining history from patient or surrogate, ordering and performing treatments and interventions, ordering and review of laboratory studies, ordering and review of radiographic studies, pulse oximetry and re-evaluation of patient's condition.     Length of Stay: [place], MD  06/29/2021, 7:50 AM  Advanced Heart Failure Team Pager [phone] (M-F; 7a - 5p)  Please contact [facility] for night-coverage after hours (5p -7a ) and weekends on [email or web address]

## 2021-06-27 NOTE — Progress Notes (Signed)
Patient ID: Chris [name]., male   DOB: October 14, 1965, 55 y.o.   MRN: [id number]  Extracorporeal support note     ECLS support day: 10 Indication: Severe COPD with influenza A   Configuration: VV ECMO, Crescent catheter right IJ   Drainage cannula: Right IJ Crescent Return cannula: Right IJ Crescent   Pump speed: 3500 Pump flow: 4.89 Pump used: Cardiohelp   Sweep gas: 1.5   Circuit check: Small area of thrombus 3 o'clock  Anticoagulant: bivalirudin Anticoagulation target: PTT 60-80   Changes in support: Try to drop sweep incrementally, tolerate pH down to as low as 7.25    Anticipated goals/duration of support: wean to decannulation  [name] 2021-07-16 7:50 AM

## 2021-06-27 NOTE — Progress Notes (Signed)
ANTICOAGULATION CONSULT NOTE   Pharmacy Consult for Bivalirudin Indication:  ECMO and atrial fibrillation  Allergies  Allergen Reactions   Bactrim [Sulfamethoxazole-Trimethoprim] Hives    Patient Measurements: Height: 5\' 9"  (175.3 cm) Weight: 117 kg (257 lb 15 oz) IBW/kg (Calculated) : 70.7  Vital Signs: Temp: 98.2 F (36.8 C) (11/17 2045) Temp Source: Core (11/17 2045) BP: 106/69 (11/17 2322) Pulse Rate: 77 (11/17 2045)  Labs: Recent Labs    06/25/21 0210 06/25/21 0215 06/26/21 0349 06/26/21 0357 06/26/21 1600 06/26/21 1603 July 22, 2021 0343 22-Jul-2021 0352 07-22-2021 1603 July 22, 2021 1611 07/22/2021 1706 July 22, 2021 2200 2021/07/22 2208  HGB 7.9*   < > 7.7*   < > 6.7*   < > 6.8*   < > 7.7* 8.8* 8.8*  --  8.8*  HCT 26.6*   < > 25.6*   < > 22.7*   < > 22.4*   < > 25.4* 26.0* 26.0*  --  26.0*  PLT 145*   < > 151  --  157  --  156  --  151  --   --   --   --   APTT 76*   < > 74*  --  65*  --  62*  --  34  --   --  56*  --   LABPROT 23.1*  --  23.6*  --   --   --  23.1*  --   --   --   --   --   --   INR 2.0*  --  2.1*  --   --   --  2.1*  --   --   --   --   --   --   CREATININE 1.61*   < > 1.81*  --  1.83*  --  1.69*  --  1.77*  --   --   --   --    < > = values in this interval not displayed.     Estimated Creatinine Clearance: 59.5 mL/min (A) (by C-G formula based on SCr of 1.77 mg/dL (H)).   Medical History: Past Medical History:  Diagnosis Date   Anxiety    Atrial fibrillation (HCC)    CHF (congestive heart failure) (HCC)    COPD (chronic obstructive pulmonary disease) (HCC)    Emphysema [[name]]   Depression    Diabetes mellitus without complication (HCC)    type 2   Emphysema of lung (HCC)    GERD (gastroesophageal reflux disease)    HIV disease (HCC)    Hypertension    Oxygen deficiency     Assessment: 55 Y/O male on chronic apixaban for atrial fibrillation. Previous placed on heparin in anticipation of line placement and possible procedures. Last apixaban  dose given @0948  11/7. Patient cannulated and ECMO initiated 11/7; received a heparin bolus at cannulation, then switched to bivalrudin infusion. Pharmacy to dose bivalirudin.  Bivalirudin held this afternoon for bleeding and occult + stool.  Colonoscopy + bleeding AVM> clipped Per GI ok to resume bivalirudin Will start lower than last therapeutic rate  11/18 AM update: aPTT therapeutic after re-start   Goal of Therapy:  aPTT 50-70 secs Monitor platelets by anticoagulation protocol: Yes   Plan:  Cont bivalirudin at 0.18 mg/kg/hr>>>aPTT with AM labs Monitor q12h Aptt and CBC, s/sx bleeding Continue to trend Hgb after transfusion   13/7, PharmD, BCPS Clinical Pharmacist Phone: [phone]

## 2021-06-27 NOTE — Progress Notes (Addendum)
Pharmacy Antibiotic Note  [name]. is a 55 y.o. male admitted on 06/21/2021 with pneumonia. Patient was started on BiPAP and found to be Influenza A positive, requiring intubation on 11/3. Patient cannulated and ECMO initiated on 11/7. Pharmacy consulted for cefepime and vancomycin dosing.  Empirically started on vancomycin and cefepime with ECMO cannulation (11/7) and narrowed to cefepime alone with trach aspirate (11/17) growing MSSA and pseudomonas. Repeat sputum culture (11/13) grew out few psuedomonas which was pan-sensitive.   Now, vancomycin restarted with worsening leukocytosis for empiric sepsis coverage.  WBC 26.6, afebrile SCr 1.69 - slightly above baseline of 1.2-1.3  Plan: Give vancomycin 2500 mg IV x1 Vancomycin 750 mg IV every 12 hours.  Goal trough 10-15 mcg/mL. Cefepime 2 gm IV every 8 hours through 11/21 F/U on cultures and sensitivities, de-escalate as able Obtain vancomycin levels at steady state Monitor CRRT off times, SCr, CBC  Temp (24hrs), Avg:98.5 F (36.9 C), Min:98 F (36.7 C), Max:99.1 F (37.3 C)  Recent Labs  Lab 06/24/21 1604 06/25/21 0210 06/25/21 1540 06/25/21 2145 06/26/21 0349 06/26/21 1600 07/04/2021 0343 06/14/2021 0348  WBC 20.3* 18.0* 21.0*  --  22.7* 25.9* 26.6*  --   CREATININE 1.83* 1.61* 1.84* 1.81* 1.81* 1.83* 1.69*  --   LATICACIDVEN 1.0 0.6 0.7  --  0.9  --   --  0.6    Estimated Creatinine Clearance: 62.3 mL/min (A) (by C-G formula based on SCr of 1.69 mg/dL (H)).    Allergies  Allergen Reactions   Bactrim [Sulfamethoxazole-Trimethoprim] Hives    Antimicrobials this admission: Vancomycin 11/17 >>                      11/7 >> 11/10 Cefepime 11/08 >> (11/21)  Ceftriaxone 11/7 >> 11/7 Unasyn 11/6 >> 11/7 Oseltamivir 11/3 >> 11/7  Dose adjustments this admission: none  Microbiology results: 11/17 BCx: sent 11/14 UA: neg 11/13 Sputum: few PsA - pan sensitive 11/7 Sputum: Staph aureus, Pseudomonas 11/7  Staphylococcus aureus: positive 11/7, 14 MRSA PCR: negative 11/3 Resp panel: Influenza A positive 11/3 MRSA PCR: not detected  Thank you for allowing pharmacy to be a part of this patient's care.  Filbert Schilder, PharmD PGY1 Pharmacy Resident 06/28/2021  7:59 AM  Please check [email or web address] for unit-specific pharmacy phone numbers.

## 2021-06-28 ENCOUNTER — Inpatient Hospital Stay (HOSPITAL_COMMUNITY): Payer: Medicare HMO

## 2021-06-28 DIAGNOSIS — K922 Gastrointestinal hemorrhage, unspecified: Secondary | ICD-10-CM | POA: Diagnosis not present

## 2021-06-28 DIAGNOSIS — J80 Acute respiratory distress syndrome: Secondary | ICD-10-CM | POA: Diagnosis not present

## 2021-06-28 DIAGNOSIS — J9621 Acute and chronic respiratory failure with hypoxia: Secondary | ICD-10-CM | POA: Diagnosis not present

## 2021-06-28 DIAGNOSIS — N179 Acute kidney failure, unspecified: Secondary | ICD-10-CM | POA: Diagnosis not present

## 2021-06-28 DIAGNOSIS — I4891 Unspecified atrial fibrillation: Secondary | ICD-10-CM | POA: Diagnosis not present

## 2021-06-28 DIAGNOSIS — J101 Influenza due to other identified influenza virus with other respiratory manifestations: Secondary | ICD-10-CM | POA: Diagnosis not present

## 2021-06-28 DIAGNOSIS — J9622 Acute and chronic respiratory failure with hypercapnia: Secondary | ICD-10-CM | POA: Diagnosis not present

## 2021-06-28 LAB — POCT I-STAT 7, (LYTES, BLD GAS, ICA,H+H)
Acid-Base Excess: 9 mmol/L — ABNORMAL HIGH (ref 0.0–2.0)
Acid-Base Excess: 1 mmol/L (ref 0.0–2.0)
Acid-Base Excess: 8 mmol/L — ABNORMAL HIGH (ref 0.0–2.0)
Acid-Base Excess: 8 mmol/L — ABNORMAL HIGH (ref 0.0–2.0)
Acid-Base Excess: 8 mmol/L — ABNORMAL HIGH (ref 0.0–2.0)
Acid-Base Excess: 9 mmol/L — ABNORMAL HIGH (ref 0.0–2.0)
Acid-Base Excess: 9 mmol/L — ABNORMAL HIGH (ref 0.0–2.0)
Acid-Base Excess: 8 mmol/L — ABNORMAL HIGH (ref 0.0–2.0)
Bicarbonate: 35.4 mmol/L — ABNORMAL HIGH (ref 20.0–28.0)
Bicarbonate: 24.7 mmol/L (ref 20.0–28.0)
Bicarbonate: 34.9 mmol/L — ABNORMAL HIGH (ref 20.0–28.0)
Bicarbonate: 34.2 mmol/L — ABNORMAL HIGH (ref 20.0–28.0)
Bicarbonate: 35.7 mmol/L — ABNORMAL HIGH (ref 20.0–28.0)
Bicarbonate: 35.4 mmol/L — ABNORMAL HIGH (ref 20.0–28.0)
Bicarbonate: 35.4 mmol/L — ABNORMAL HIGH (ref 20.0–28.0)
Bicarbonate: 35.1 mmol/L — ABNORMAL HIGH (ref 20.0–28.0)
Calcium, Ion: 1.26 mmol/L (ref 1.15–1.40)
Calcium, Ion: 1.07 mmol/L — ABNORMAL LOW (ref 1.15–1.40)
Calcium, Ion: 1.27 mmol/L (ref 1.15–1.40)
Calcium, Ion: 1.26 mmol/L (ref 1.15–1.40)
Calcium, Ion: 1.29 mmol/L (ref 1.15–1.40)
Calcium, Ion: 1.27 mmol/L (ref 1.15–1.40)
Calcium, Ion: 1.29 mmol/L (ref 1.15–1.40)
Calcium, Ion: 1.3 mmol/L (ref 1.15–1.40)
HCT: 26 % — ABNORMAL LOW (ref 39.0–52.0)
HCT: 19 % — ABNORMAL LOW (ref 39.0–52.0)
HCT: 26 % — ABNORMAL LOW (ref 39.0–52.0)
HCT: 26 % — ABNORMAL LOW (ref 39.0–52.0)
HCT: 27 % — ABNORMAL LOW (ref 39.0–52.0)
HCT: 24 % — ABNORMAL LOW (ref 39.0–52.0)
HCT: 24 % — ABNORMAL LOW (ref 39.0–52.0)
HCT: 24 % — ABNORMAL LOW (ref 39.0–52.0)
Hemoglobin: 8.8 g/dL — ABNORMAL LOW (ref 13.0–17.0)
Hemoglobin: 6.5 g/dL — CL (ref 13.0–17.0)
Hemoglobin: 8.8 g/dL — ABNORMAL LOW (ref 13.0–17.0)
Hemoglobin: 8.8 g/dL — ABNORMAL LOW (ref 13.0–17.0)
Hemoglobin: 9.2 g/dL — ABNORMAL LOW (ref 13.0–17.0)
Hemoglobin: 8.2 g/dL — ABNORMAL LOW (ref 13.0–17.0)
Hemoglobin: 8.2 g/dL — ABNORMAL LOW (ref 13.0–17.0)
Hemoglobin: 8.2 g/dL — ABNORMAL LOW (ref 13.0–17.0)
O2 Saturation: 96 %
O2 Saturation: 100 %
O2 Saturation: 99 %
O2 Saturation: 97 %
O2 Saturation: 95 %
O2 Saturation: 98 %
O2 Saturation: 91 %
O2 Saturation: 95 %
Patient temperature: 37.4
Patient temperature: 36.8
Patient temperature: 36.8
Patient temperature: 36.8
Patient temperature: 36.8
Patient temperature: 36.8
Patient temperature: 36.8
Patient temperature: 36.9
Potassium: 4.2 mmol/L (ref 3.5–5.1)
Potassium: 3.2 mmol/L — ABNORMAL LOW (ref 3.5–5.1)
Potassium: 4.1 mmol/L (ref 3.5–5.1)
Potassium: 4.3 mmol/L (ref 3.5–5.1)
Potassium: 4.1 mmol/L (ref 3.5–5.1)
Potassium: 4.2 mmol/L (ref 3.5–5.1)
Potassium: 4 mmol/L (ref 3.5–5.1)
Potassium: 3.7 mmol/L (ref 3.5–5.1)
Sodium: 155 mmol/L — ABNORMAL HIGH (ref 135–145)
Sodium: 155 mmol/L — ABNORMAL HIGH (ref 135–145)
Sodium: 156 mmol/L — ABNORMAL HIGH (ref 135–145)
Sodium: 156 mmol/L — ABNORMAL HIGH (ref 135–145)
Sodium: 156 mmol/L — ABNORMAL HIGH (ref 135–145)
Sodium: 155 mmol/L — ABNORMAL HIGH (ref 135–145)
Sodium: 154 mmol/L — ABNORMAL HIGH (ref 135–145)
Sodium: 155 mmol/L — ABNORMAL HIGH (ref 135–145)
TCO2: 37 mmol/L — ABNORMAL HIGH (ref 22–32)
TCO2: 26 mmol/L (ref 22–32)
TCO2: 37 mmol/L — ABNORMAL HIGH (ref 22–32)
TCO2: 36 mmol/L — ABNORMAL HIGH (ref 22–32)
TCO2: 38 mmol/L — ABNORMAL HIGH (ref 22–32)
TCO2: 37 mmol/L — ABNORMAL HIGH (ref 22–32)
TCO2: 37 mmol/L — ABNORMAL HIGH (ref 22–32)
TCO2: 37 mmol/L — ABNORMAL HIGH (ref 22–32)
pCO2 arterial: 63.9 mmHg — ABNORMAL HIGH (ref 32.0–48.0)
pCO2 arterial: 32.8 mmHg (ref 32.0–48.0)
pCO2 arterial: 59.8 mmHg — ABNORMAL HIGH (ref 32.0–48.0)
pCO2 arterial: 54.9 mmHg — ABNORMAL HIGH (ref 32.0–48.0)
pCO2 arterial: 68 mmHg (ref 32.0–48.0)
pCO2 arterial: 58.3 mmHg — ABNORMAL HIGH (ref 32.0–48.0)
pCO2 arterial: 63 mmHg — ABNORMAL HIGH (ref 32.0–48.0)
pCO2 arterial: 62.5 mmHg — ABNORMAL HIGH (ref 32.0–48.0)
pH, Arterial: 7.353 (ref 7.350–7.450)
pH, Arterial: 7.373 (ref 7.350–7.450)
pH, Arterial: 7.483 — ABNORMAL HIGH (ref 7.350–7.450)
pH, Arterial: 7.402 (ref 7.350–7.450)
pH, Arterial: 7.327 — ABNORMAL LOW (ref 7.350–7.450)
pH, Arterial: 7.39 (ref 7.350–7.450)
pH, Arterial: 7.357 (ref 7.350–7.450)
pH, Arterial: 7.357 (ref 7.350–7.450)
pO2, Arterial: 93 mmHg (ref 83.0–108.0)
pO2, Arterial: 125 mmHg — ABNORMAL HIGH (ref 83.0–108.0)
pO2, Arterial: 194 mmHg — ABNORMAL HIGH (ref 83.0–108.0)
pO2, Arterial: 92 mmHg (ref 83.0–108.0)
pO2, Arterial: 83 mmHg (ref 83.0–108.0)
pO2, Arterial: 103 mmHg (ref 83.0–108.0)
pO2, Arterial: 66 mmHg — ABNORMAL LOW (ref 83.0–108.0)
pO2, Arterial: 81 mmHg — ABNORMAL LOW (ref 83.0–108.0)

## 2021-06-28 LAB — GLUCOSE, CAPILLARY
Glucose-Capillary: 109 mg/dL — ABNORMAL HIGH (ref 70–99)
Glucose-Capillary: 112 mg/dL — ABNORMAL HIGH (ref 70–99)
Glucose-Capillary: 115 mg/dL — ABNORMAL HIGH (ref 70–99)
Glucose-Capillary: 120 mg/dL — ABNORMAL HIGH (ref 70–99)
Glucose-Capillary: 105 mg/dL — ABNORMAL HIGH (ref 70–99)
Glucose-Capillary: 124 mg/dL — ABNORMAL HIGH (ref 70–99)
Glucose-Capillary: 132 mg/dL — ABNORMAL HIGH (ref 70–99)
Glucose-Capillary: 129 mg/dL — ABNORMAL HIGH (ref 70–99)
Glucose-Capillary: 139 mg/dL — ABNORMAL HIGH (ref 70–99)
Glucose-Capillary: 139 mg/dL — ABNORMAL HIGH (ref 70–99)
Glucose-Capillary: 147 mg/dL — ABNORMAL HIGH (ref 70–99)
Glucose-Capillary: 165 mg/dL — ABNORMAL HIGH (ref 70–99)
Glucose-Capillary: 200 mg/dL — ABNORMAL HIGH (ref 70–99)
Glucose-Capillary: 210 mg/dL — ABNORMAL HIGH (ref 70–99)
Glucose-Capillary: 236 mg/dL — ABNORMAL HIGH (ref 70–99)
Glucose-Capillary: 250 mg/dL — ABNORMAL HIGH (ref 70–99)
Glucose-Capillary: 205 mg/dL — ABNORMAL HIGH (ref 70–99)
Glucose-Capillary: 230 mg/dL — ABNORMAL HIGH (ref 70–99)
Glucose-Capillary: 207 mg/dL — ABNORMAL HIGH (ref 70–99)
Glucose-Capillary: 185 mg/dL — ABNORMAL HIGH (ref 70–99)
Glucose-Capillary: 197 mg/dL — ABNORMAL HIGH (ref 70–99)
Glucose-Capillary: 215 mg/dL — ABNORMAL HIGH (ref 70–99)

## 2021-06-28 LAB — LACTIC ACID, PLASMA: Lactic Acid, Venous: 0.8 mmol/L (ref 0.5–1.9)

## 2021-06-28 LAB — HEPATIC FUNCTION PANEL
ALT: 64 U/L — ABNORMAL HIGH (ref 0–44)
AST: 63 U/L — ABNORMAL HIGH (ref 15–41)
Albumin: 1.7 g/dL — ABNORMAL LOW (ref 3.5–5.0)
Alkaline Phosphatase: 35 U/L — ABNORMAL LOW (ref 38–126)
Bilirubin, Direct: <0.1 mg/dL (ref 0.0–0.2)
Total Bilirubin: 0.7 mg/dL (ref 0.3–1.2)
Total Protein: 5.5 g/dL — ABNORMAL LOW (ref 6.5–8.1)

## 2021-06-28 LAB — CBC
HCT: 27.9 % — ABNORMAL LOW (ref 39.0–52.0)
HCT: 27.1 % — ABNORMAL LOW (ref 39.0–52.0)
Hemoglobin: 9.1 g/dL — ABNORMAL LOW (ref 13.0–17.0)
Hemoglobin: 8.7 g/dL — ABNORMAL LOW (ref 13.0–17.0)
MCH: 30.2 pg (ref 26.0–34.0)
MCH: 30.4 pg (ref 26.0–34.0)
MCHC: 32.6 g/dL (ref 30.0–36.0)
MCHC: 32.1 g/dL (ref 30.0–36.0)
MCV: 92.7 fL (ref 80.0–100.0)
MCV: 94.8 fL (ref 80.0–100.0)
Platelets: 131 10*3/uL — ABNORMAL LOW (ref 150–400)
Platelets: 131 10*3/uL — ABNORMAL LOW (ref 150–400)
RBC: 3.01 MIL/uL — ABNORMAL LOW (ref 4.22–5.81)
RBC: 2.86 MIL/uL — ABNORMAL LOW (ref 4.22–5.81)
RDW: 18.7 % — ABNORMAL HIGH (ref 11.5–15.5)
RDW: 18.8 % — ABNORMAL HIGH (ref 11.5–15.5)
WBC: 23 10*3/uL — ABNORMAL HIGH (ref 4.0–10.5)
WBC: 18.3 10*3/uL — ABNORMAL HIGH (ref 4.0–10.5)
nRBC: 5.3 % — ABNORMAL HIGH (ref 0.0–0.2)
nRBC: 4.8 % — ABNORMAL HIGH (ref 0.0–0.2)

## 2021-06-28 LAB — BASIC METABOLIC PANEL
Anion gap: 6 (ref 5–15)
Anion gap: 5 (ref 5–15)
BUN: 118 mg/dL — ABNORMAL HIGH (ref 6–20)
BUN: 117 mg/dL — ABNORMAL HIGH (ref 6–20)
CO2: 31 mmol/L (ref 22–32)
CO2: 33 mmol/L — ABNORMAL HIGH (ref 22–32)
Calcium: 8.5 mg/dL — ABNORMAL LOW (ref 8.9–10.3)
Calcium: 8.7 mg/dL — ABNORMAL LOW (ref 8.9–10.3)
Chloride: 115 mmol/L — ABNORMAL HIGH (ref 98–111)
Chloride: 115 mmol/L — ABNORMAL HIGH (ref 98–111)
Creatinine, Ser: 1.75 mg/dL — ABNORMAL HIGH (ref 0.61–1.24)
Creatinine, Ser: 1.78 mg/dL — ABNORMAL HIGH (ref 0.61–1.24)
GFR, Estimated: 45 mL/min — ABNORMAL LOW (ref >60)
GFR, Estimated: 44 mL/min — ABNORMAL LOW (ref >60)
Glucose, Bld: 121 mg/dL — ABNORMAL HIGH (ref 70–99)
Glucose, Bld: 265 mg/dL — ABNORMAL HIGH (ref 70–99)
Potassium: 4.3 mmol/L (ref 3.5–5.1)
Potassium: 4 mmol/L (ref 3.5–5.1)
Sodium: 152 mmol/L — ABNORMAL HIGH (ref 135–145)
Sodium: 153 mmol/L — ABNORMAL HIGH (ref 135–145)

## 2021-06-28 LAB — FIBRINOGEN: Fibrinogen: 578 mg/dL — ABNORMAL HIGH (ref 210–475)

## 2021-06-28 LAB — POCT I-STAT EG7
Acid-Base Excess: 8 mmol/L — ABNORMAL HIGH (ref 0.0–2.0)
Bicarbonate: 35.1 mmol/L — ABNORMAL HIGH (ref 20.0–28.0)
Calcium, Ion: 1.26 mmol/L (ref 1.15–1.40)
HCT: 26 % — ABNORMAL LOW (ref 39.0–52.0)
Hemoglobin: 8.8 g/dL — ABNORMAL LOW (ref 13.0–17.0)
O2 Saturation: 51 %
Patient temperature: 36.8
Potassium: 4.1 mmol/L (ref 3.5–5.1)
Sodium: 156 mmol/L — ABNORMAL HIGH (ref 135–145)
TCO2: 37 mmol/L — ABNORMAL HIGH (ref 22–32)
pCO2, Ven: 66.2 mmHg — ABNORMAL HIGH (ref 44.0–60.0)
pH, Ven: 7.331 (ref 7.250–7.430)
pO2, Ven: 30 mmHg — CL (ref 32.0–45.0)

## 2021-06-28 LAB — LACTATE DEHYDROGENASE: LDH: 281 U/L — ABNORMAL HIGH (ref 98–192)

## 2021-06-28 LAB — APTT
aPTT: 55 seconds — ABNORMAL HIGH (ref 24–36)
aPTT: 56 seconds — ABNORMAL HIGH (ref 24–36)

## 2021-06-28 LAB — PROTIME-INR
INR: 1.9 — ABNORMAL HIGH (ref 0.8–1.2)
Prothrombin Time: 21.6 seconds — ABNORMAL HIGH (ref 11.4–15.2)

## 2021-06-28 LAB — PATHOLOGIST SMEAR REVIEW

## 2021-06-28 MED ORDER — FUROSEMIDE 10 MG/ML IJ SOLN
60.0000 mg | Freq: Three times a day (TID) | INTRAMUSCULAR | Status: DC
  Administered 2021-06-28 – 2021-06-29 (×4): 60 mg via INTRAVENOUS
  Filled 2021-06-28 (×4): qty 6

## 2021-06-28 MED ORDER — MIDAZOLAM HCL 2 MG/2ML IJ SOLN
2.0000 mg | INTRAMUSCULAR | Status: DC | PRN
  Administered 2021-06-28 – 2021-07-03 (×16): 2 mg via INTRAVENOUS
  Filled 2021-06-28 (×17): qty 2

## 2021-06-28 NOTE — Progress Notes (Signed)
NAME:  Chris Willmon., MRN:  [id number], DOB:  [birth year], LOS: 15 ADMISSION DATE:  06/12/2021, CONSULTATION DATE:  06/15/2019 REFERRING MD:  Gaynell Face, CHIEF COMPLAINT:  hypercapnic respiratory failure   History of Present Illness:   55 year old man with O2 dependent COPD presenting with acute on chronic respiratory failure now on VV ECMO due to refractory hypoxia and hypercapnia.  Pertinent  Medical History   Past Medical History:  Diagnosis Date   Anxiety    Atrial fibrillation (HCC)    CHF (congestive heart failure) (HCC)    COPD (chronic obstructive pulmonary disease) (HCC)    Emphysema [[name]]   Depression    Diabetes mellitus without complication (HCC)    type 2   Emphysema of lung (HCC)    GERD (gastroesophageal reflux disease)    HIV disease (HCC)    Hypertension    Oxygen deficiency     Significant Hospital Events: Including procedures, antibiotic start and stop dates in addition to other pertinent events   11/7- VV ECMO started 11/9 - weaning sedation. Transfused 2 units PRBC to keep O2 carrying capacity.  11/9 - remains agitated with wake up  11/10 - tracheostomy tube placed 11/14 - trach exchanged to distal XLT due to cuff leak 11/17  - weaned sweep down to 1.5lpm prior to developing melena 11/17  - EGD bleeding duodenal bulb AVM injected and cauterized. Duodenal bulb ischemia.  Interim History / Subjective:   Continues to have melena but HB Stable. Weaning sweep.   Objective   Blood pressure 106/69, pulse 87, temperature 98.4 F (36.9 C), temperature source Core, resp. rate (!) 24, height 5\' 9"  (1.753 m), weight 116.2 kg, SpO2 99 %.    Vent Mode: PCV FiO2 (%):  [50 %] 50 % Set Rate:  [24 bmp] 24 bmp PEEP:  [10 cmH20] 10 cmH20 Plateau Pressure:  [18 cmH20-20 cmH20] 20 cmH20   Intake/Output Summary (Last 24 hours) at 06/28/2021 06/21/2021 Last data filed at 06/28/2021 0700 Gross per 24 hour  Intake 4895.13 ml  Output 6860 ml  Net -1964.87 ml    Filed  Weights   06/26/21 0500 Jul 26, 2021 0500 06/28/21 0301  Weight: 120.6 kg 117 kg 116.2 kg    Examination: General: critically ill appearing man lying in bed trached, sedated, on ecmo HEENT: [place]/AT, eyes anicteric Neck: no bleeding or drainage around trach, Shiley cuffed distal XLT #8 Resp: no wheezing or rhonchi, synchronous with MV Cardio: S1S2, irreg rhythm, reg rate Neuro: RASS -3, intact cough reflex, PERRL Extremities: moderate dependent edema, no cyanosis GU: yellow urine, foley Derm: warm, dry, no rashes or ecchymoses  Ancillary tests:    CXR reviewed and shows both lung fields clear with cannula in good position.   ABG: mild compensated respiratory acidosis.   Assessment & Plan:   Critically ill  due to acute on chronic hypoxic and hypercapnic respiratory failure from influenza pneumonia on background of severe COPD, prior tracheostomy. On HOT 10 years. Acute COPD exacerbation; severe underlying COPD- ARDS-   Pseudomonas and MSSA HAP Now trach dependent Concern for undiagnosed OSA Acute metabolic encephalopathy with severe agitation HIV, undetectable VL Distributive and hemorrhagic shock due to sedation Acute blood loss anemia due to acute bleeding duodenal AVM Chronic Afib w/RVR H/o HFpEF GERD Type 2 diabetes with hyperglycemia Hypernatremia from high insensitive losses Deconditioning, acute on chronic  Plan:   - Increase ventilator support as still room to do so while still maintaining lung protective strategy.  Given low  sweep, the patient may then be able to separate from ECMO soon.  Would aim for normal to slightly alkalotic ABG prior to initiating sweep trial.  - Continue current bronchodilators.  - Complete current course of antibiotics.  - Continue current diuretic strategy - Replace Cortrak and restart and increase enteral sedation. Has been on high doses for a while, so at risk for withdrawal with rapid weaning.  - Continue ART - Remains on IV insulin.   Will stop steroids as likely contributing to both agititation and insulin resistance.    Best Practice (right click and "Reselect all SmartList Selections" daily)   Diet/type: tubefeeds  DVT prophylaxis: bivalirudin GI prophylaxis: PPI Lines: Central line and Arterial Line Foley:  external catheter Code Status:  full code Last date of multidisciplinary goals of care discussion: continue for family updates   CRITICAL CARE Performed by: Kipp Brood   Total critical care time: 50 minutes  Critical care time was exclusive of separately billable procedures and treating other patients.  Critical care was necessary to treat or prevent imminent or life-threatening deterioration.  Critical care was time spent personally by me on the following activities: development of treatment plan with patient and/or surrogate as well as nursing, discussions with consultants, evaluation of patient's response to treatment, examination of patient, obtaining history from patient or surrogate, ordering and performing treatments and interventions, ordering and review of laboratory studies, ordering and review of radiographic studies, pulse oximetry, re-evaluation of patient's condition and participation in multidisciplinary rounds.  Kipp Brood, MD [facility] ICU Physician [place]  Pager: [phone] Mobile: [phone] After hours: [phone].

## 2021-06-28 NOTE — Procedures (Signed)
Cortrak  Person Inserting Tube:  Wandell Scullion, Verdon Cummins, RD Tube Type:  Cortrak - 55 inches Tube Size:  10 Tube Location:  Left nare Initial Placement:  Stomach Secured by: Bridle Technique Used to Measure Tube Placement:  Marking at nare/corner of mouth Cortrak Secured At:  84 cm  Cortrak Tube Team Note:  Consult received to place a Cortrak feeding tube.   X-ray is required, abdominal x-ray has been ordered by the Cortrak team. Please confirm tube placement before using the Cortrak tube.   If the tube becomes dislodged please keep the tube and contact the Cortrak team at [email or web address] ([email or web address]) for replacement.  If after hours and replacement cannot be delayed, place a NG tube and confirm placement with an abdominal x-ray.     Rae Lips., MS, RD, LDN (she/her/hers) RD pager number and weekend/on-call pager number located in Amion.

## 2021-06-28 NOTE — Progress Notes (Signed)
[facility] Progress Note    Since last GI note: EGD yesterday with treatment of actively bleeding duodenal bulb AVM.  Since then he's continued to have melenic stools but has HD stabilized a bit, the team has been able to wean his pressors!  He is back on the necessary IV blood thinner for ECMO  Objective: Vital signs in last 24 hours: Temp:  [98.2 F (36.8 C)-98.4 F (36.9 C)] 98.4 F (36.9 C) (11/18 0200) Pulse Rate:  [74-110] 87 (11/18 0318) Resp:  [17-37] 24 (11/18 0700) BP: (99-124)/(49-69) 106/69 (11/17 2322) SpO2:  [70 %-100 %] 100 % (11/18 0700) Arterial Line BP: (2-165)/(-5-66) 118/51 (11/18 0700) FiO2 (%):  [50 %] 50 % (11/18 0400) Weight:  [627.0 kg] 116.2 kg (11/18 0301) Last BM Date: 06/28/21 General: trach, intubated on ECMO in ICU Heart: regular rate and rythm  Lab Results: Recent Labs    06/29/2021 0343 06/16/2021 0352 06/24/2021 1603 07/02/2021 1611 06/21/2021 2208 06/28/21 0350 06/28/21 0358  WBC 26.6*  --  29.9*  --   --  23.0*  --   HGB 6.8*   < > 7.7*   < > 8.8* 9.1* 8.8*  PLT 156  --  151  --   --  131*  --   MCV 97.4  --  95.1  --   --  92.7  --    < > = values in this interval not displayed.   Recent Labs    06/29/2021 0343 06/28/2021 0352 07/02/2021 1603 07/04/2021 1611 07/01/2021 2208 06/28/21 0350 06/28/21 0358  NA 151*   < > 151*   < > 154* 152* 155*  K 4.2   < > 4.6   < > 4.6 4.3 4.2  CL 115*  --  113*  --   --  115*  --   CO2 33*  --  33*  --   --  31  --   GLUCOSE 161*  --  116*  --   --  121*  --   BUN 120*  --  119*  --   --  118*  --   CREATININE 1.69*  --  1.77*  --   --  1.75*  --   CALCIUM 8.3*  --  8.3*  --   --  8.5*  --    < > = values in this interval not displayed.   Recent Labs    06/26/21 0349 06/29/2021 0343 06/28/21 0350  PROT 5.7* 5.2* 5.5*  ALBUMIN 1.6* 1.5* 1.7*  AST 57* 53* 63*  ALT 79* 65* 64*  ALKPHOS 50 42 35*  BILITOT 0.6 0.6 0.7  BILIDIR <0.1 <0.1 <0.1  IBILI NOT CALCULATED NOT CALCULATED NOT  CALCULATED   Recent Labs    06/29/2021 0343 06/28/21 0350  INR 2.1* 1.9*    Studies/Results: DG CHEST PORT 1 VIEW  Result Date: 06/11/2021 CLINICAL DATA:  ECMO soft EXAM: PORTABLE CHEST 1 VIEW COMPARISON:  Chest x-ray dated June 26, 2021 FINDINGS: ETT, left IJ line, and feeding tube are unchanged position. Unchanged position of ECMO cannula. Bibasilar opacities, unchanged compared to prior and likely due to atelectasis. No large pleural effusion or pneumothorax. IMPRESSION: Stable support devices. Bibasilar opacities are unchanged compared to prior likely due to atelectasis. Electronically Signed   By: Yetta Glassman M.D.   On: 06/29/2021 08:14     Medications: Scheduled Meds:  amiodarone  200 mg Oral BID   arformoterol  15 mcg Nebulization BID   budesonide (  PULMICORT) nebulizer solution  0.25 mg Nebulization BID   chlorhexidine gluconate (MEDLINE KIT)  15 mL Mouth Rinse BID   Chlorhexidine Gluconate Cloth  6 each Topical Daily   clonazePAM  1.5 mg Per Tube TID   emtricitabine-tenofovir AF  1 tablet Per Tube Daily   And   dolutegravir  50 mg Per Tube Daily   feeding supplement (PROSource TF)  45 mL Per Tube TID   free water  200 mL Per Tube Q4H   mouth rinse  15 mL Mouth Rinse 10 times per day   methylPREDNISolone (SOLU-MEDROL) injection  40 mg Intravenous Daily   oxyCODONE  15 mg Per Tube Q6H   pantoprazole (PROTONIX) IV  40 mg Intravenous Q12H   polyethylene glycol  17 g Per Tube BID   QUEtiapine  100 mg Per Tube BID   revefenacin  175 mcg Nebulization Daily   rosuvastatin  20 mg Per Tube Daily   senna-docusate  1 tablet Per Tube BID   sodium chloride flush  10-40 mL Intracatheter Q12H   sodium chloride flush  3 mL Intravenous Q12H   valproic acid  250 mg Per Tube TID   Continuous Infusions:  sodium chloride     sodium chloride 10 mL/hr at 06/12/2021 1502   sodium chloride     sodium chloride Stopped (06/14/2021 1341)   sodium chloride     sodium chloride      bivalirudin (ANGIOMAX) infusion 0.5 mg/mL (Non-ACS indications) 0.18 mg/kg/hr (06/28/21 0700)   ceFEPime (MAXIPIME) IV Stopped (06/28/21 0612)   dexmedetomidine (PRECEDEX) IV infusion 1.7 mcg/kg/hr (06/28/21 0700)   feeding supplement (PIVOT 1.5 CAL) Stopped (06/29/2021 1330)   HYDROmorphone 4 mg/hr (06/28/21 0700)   insulin 2.6 Units/hr (06/28/21 0700)   norepinephrine (LEVOPHED) Adult infusion 2 mcg/min (06/28/21 0700)   vancomycin Stopped (06/16/2021 2206)   vasopressin 0.03 Units/min (06/28/21 0700)   PRN Meds:.sodium chloride, Place/Maintain arterial line **AND** sodium chloride, acetaminophen **OR** acetaminophen, albuterol, bisacodyl, dextrose, docusate, HYDROmorphone, midazolam, ondansetron **OR** ondansetron (ZOFRAN) IV, sodium chloride flush, sodium chloride flush  Assessment/Plan: 55 y.o. male critically ill, with acute on chronic resp failure, on ECMO  ECMO requires IV blood thinner or it will clot off. I understand that typically ECMO patients need a unit of blood about every 3 days or so.  This is important to understand as a baseline.  I treated an briskly bleeding duodenal bulb AVM yesterday during EGD and hopefully bleeding will not recur. Duodenum was also noted to have diffuse, scattered superficial ulcers which may represent ischemic damage.  Given his critical illness, hypotension, need for pressor support recently he is certainly at risk for ischemic damage to the bowel (usually small bowel, colon).  I am around all weekend, available for any questions or concerns.  If he is felt to be GI bleeding again I think a CT angio would be my first recommendation. We will round on him again on Monday otherwise.   Milus Banister, MD  06/28/2021, 7:41 AM  Gastroenterology Pager [phone]

## 2021-06-28 NOTE — Progress Notes (Signed)
ANTICOAGULATION CONSULT NOTE   Pharmacy Consult for Bivalirudin Indication:  ECMO and atrial fibrillation  Allergies  Allergen Reactions   Bactrim [Sulfamethoxazole-Trimethoprim] Hives    Patient Measurements: Height: 5\' 9"  (175.3 cm) Weight: 116.2 kg (256 lb 2.8 oz) IBW/kg (Calculated) : 70.7  Vital Signs: Temp: 98.2 F (36.8 C) (11/18 1600) Temp Source: Core (11/18 1600) BP: 111/52 (11/18 1600) Pulse Rate: 91 (11/18 1600)  Labs: Recent Labs    06/26/21 0349 06/26/21 0357 07-22-21 0343 2021/07/22 0352 22-Jul-2021 1603 07-22-21 1611 22-Jul-2021 2200 22-Jul-2021 2208 06/28/21 0350 06/28/21 0358 06/28/21 1128 06/28/21 1605 06/28/21 1615  HGB 7.7*   < > 6.8*   < > 7.7*   < >  --    < > 9.1*   < > 8.2* 8.7* 8.2*  HCT 25.6*   < > 22.4*   < > 25.4*   < >  --    < > 27.9*   < > 24.0* 27.1* 24.0*  PLT 151   < > 156  --  151  --   --   --  131*  --   --  131*  --   APTT 74*   < > 62*  --  34  --  56*  --  55*  --   --  56*  --   LABPROT 23.6*  --  23.1*  --   --   --   --   --  21.6*  --   --   --   --   INR 2.1*  --  2.1*  --   --   --   --   --  1.9*  --   --   --   --   CREATININE 1.81*   < > 1.69*  --  1.77*  --   --   --  1.75*  --   --   --   --    < > = values in this interval not displayed.     Estimated Creatinine Clearance: 60 mL/min (A) (by C-G formula based on SCr of 1.75 mg/dL (H)).   Medical History: Past Medical History:  Diagnosis Date   Anxiety    Atrial fibrillation (HCC)    CHF (congestive heart failure) (HCC)    COPD (chronic obstructive pulmonary disease) (HCC)    Emphysema [[name]]   Depression    Diabetes mellitus without complication (HCC)    type 2   Emphysema of lung (HCC)    GERD (gastroesophageal reflux disease)    HIV disease (HCC)    Hypertension    Oxygen deficiency     Assessment: 55 Y/O male on chronic apixaban for atrial fibrillation. Previous placed on heparin in anticipation of line placement and possible procedures. Last apixaban  dose given @0948  11/7. Patient cannulated and ECMO initiated 11/7; received a heparin bolus at cannulation, then switched to bivalrudin infusion. Pharmacy to dose bivalirudin.  Bivalirudin 11/17 for bleeding and occult + stool.  Colonoscopy + bleeding AVM> clipped, and bivalirudin resumed after procedure.  APTT remains stable within new lower goal range this evening. Stools still maroon per nursing staff this AM, oral bleeding resolved. CBC low but stable.  Goal of Therapy:  aPTT 50-70 secs Monitor platelets by anticoagulation protocol: Yes   Plan:  Continue bivalirudin at 0.18 mg/kg/hr. Monitor q12h aptt and CBC, s/sx bleeding Continue to trend Hgb after GI procedure   13/7, PharmD, BCPS Please check AMION for all [facility]  contact numbers Clinical Pharmacist 06/28/2021 5:29 PM

## 2021-06-28 NOTE — Progress Notes (Signed)
Nutrition Follow-up  DOCUMENTATION CODES:   Obesity unspecified  INTERVENTION:   Tube Feeding Via Cortrak: Pivot 1.5 at 70 ml/hr x 21 hours (holding 2 hours before and 1 hour after Tivicay dose). Prosource TF 45 ml TID Provides 2325 kcals, 171 g of protein and 1117 mL of free water  Consider increasing free water flushes given persistent hypernatremia   NUTRITION DIAGNOSIS:   Inadequate oral intake related to inability to eat as evidenced by NPO status.  Being addressed via TF   GOAL:   Provide needs based on ASPEN/SCCM guidelines  Progressing  MONITOR:   Vent status, TF tolerance, Labs  REASON FOR ASSESSMENT:   Ventilator, Consult Enteral/tube feeding initiation and management  ASSESSMENT:   55 yo male admitted with respiratory distress r/t influenza A. Required intubation on 11/3. PMH includes COPD, on 2 L oxygen at baseline, A fib, HF, HTN, DM-2, HIV.  11/07 VV ECMO cannulation 11/09 Cortrak advanced to LOT  11/10 Trach placed 11/14 Trach exchange to XLT due to cuff leak 11/18 EGD: actively bleeding  duodenal bulb AVM injected and cauterized, duodenal ischemia  Pt remains on VV ECMO, remains on vent support via trach Rectal tube in place  TF held yesterday with UGIB and then Cortrak removed during EGD Cortrak replaced today, ok for GI and CCM to replace and restart TF.  Free water flushes of 200 mL q 4 hours but pt has not been receiving due to no enteral access  BUN >119, Elevated BUN likely related GI bleed, free water deficit  Labs: sodium 156 (H) Meds: insulin drip, hydromorphone, vasopressin, precedex, levophed   Diet Order:   Diet Order             Diet NPO time specified  Diet effective now                   EDUCATION NEEDS:   Not appropriate for education at this time  Skin:  Skin Assessment: Reviewed RN Assessment  Last BM:  11/18 rectal tube  Height:   Ht Readings from Last 1 Encounters:  06/23/21 5\' 9"  (1.753 m)     Weight:   Wt Readings from Last 1 Encounters:  06/28/21 116.2 kg    Ideal Body Weight:     BMI:  Body mass index is 37.83 kg/m.  Estimated Nutritional Needs:   Kcal:  2200-2500 kcals  Protein:  145-180 g  Fluid:  >/= 2 L   06/16/2021 MS, RDN, LDN, CNSC Registered Dietitian III Clinical Nutrition RD Pager and On-Call Pager Number Located in [place]

## 2021-06-28 NOTE — Progress Notes (Signed)
Patient ID: Chris A Mongillo Jr., male   DOB: 03/16/1966, 55 y.o.   MRN: [id number]     Advanced Heart Failure Rounding Note  PCP-Cardiologist: David Harding, MD   Subjective:    11/7: VV ECMO cannulation 11/10: Tracheostomy 11/14: Trach exchange 11/17: EGD for UGIB, APC duodenal AVM  EGD yesterday for GI bleeding, APC to bleeding duodenal AVM.  This has stabilized hgb and MAP. Bivalirudin held transiently, now back on. 2 units PRBCs yesterday. Today, NE 2 and vasopressin 0.03. HR 80s atrial fibrillation, now on po amiodarone.   Afebrile, on cefepime.  Trach aspirate from 11/7 with Pseudomonas and MSSA, 11/13 trach aspirate still with Pseudomonas.  Oseltamivir for influenza completed. Remains on Solumedrol for COPD.  AKI with creatinine 2.37 => 1.85 => 1.63 => 1.27 => 1.31 => 1.43 => 1.64 => 1.6 => 1.81 => 1.69 => 1.75.  Very high BUN in setting of UGI bleed.  Excellent UOP with negative I/Os, weight coming down.  Has been getting Lasix 60 mg IV every 8 hrs.    FiO2 0.5 on vent. Sweep 3.   Patient wakes up with sedation wean, agitated.   TFs ongoing.   ECMO: 3500 rpm Flow 4.66 Pvenous -88 DeltaP 29 Sweep 3 ABG 7.35/64/93/96% PTT 55 (goal 50-70), on bivalirudin Lactate 0.8 LDH 323 => 356 => 310 => 278 => 262 => 300 => 407 => 341 => 333 => 287 => 281  Na 152, on free H20 boluses 300 q4hrs (but has missed several doses waiting to get Cortrak replaced post-EGD).   CXR: Clearing   Objective:   Weight Range: 116.2 kg Body mass index is 37.83 kg/m.   Vital Signs:   Temp:  [98.2 F (36.8 C)-98.4 F (36.9 C)] 98.4 F (36.9 C) (11/18 0200) Pulse Rate:  [74-110] 87 (11/18 0318) Resp:  [17-37] 24 (11/18 0700) BP: (99-124)/(50-69) 106/69 (11/17 2322) SpO2:  [70 %-100 %] 99 % (11/18 0800) Arterial Line BP: (2-165)/(-5-66) 118/51 (11/18 0700) FiO2 (%):  [50 %] 50 % (11/18 0743) Weight:  [116.2 kg] 116.2 kg (11/18 0301) Last BM Date: 06/28/21  Weight change: Filed Weights    06/26/21 0500 06/30/2021 0500 06/28/21 0301  Weight: 120.6 kg 117 kg 116.2 kg    Intake/Output:   Intake/Output Summary (Last 24 hours) at 06/28/2021 0808 Last data filed at 06/28/2021 0700 Gross per 24 hour  Intake 4895.13 ml  Output 6860 ml  Net -1964.87 ml      Physical Exam    General: Sedated on vent Neck: RIJ Crescent, LIJ CVL, no thyromegaly or thyroid nodule.  Lungs: Clear to auscultation bilaterally with normal respiratory effort. CV: Nondisplaced PMI.  Heart irregular S1/S2, no S3/S4, no murmur.  No peripheral edema.  Abdomen: Soft, nontender, no hepatosplenomegaly, no distention.  Skin: Intact without lesions or rashes.  Neurologic: Sedated Extremities: No clubbing or cyanosis.  HEENT: Normal.    Telemetry   Atrial fibrillation 80s (personally reviewed)  Labs    CBC Recent Labs    06/11/2021 1603 06/28/2021 1611 06/28/21 0350 06/28/21 0358  WBC 29.9*  --  23.0*  --   HGB 7.7*   < > 9.1* 8.8*  HCT 25.4*   < > 27.9* 26.0*  MCV 95.1  --  92.7  --   PLT 151  --  131*  --    < > = values in this interval not displayed.   Basic Metabolic Panel Recent Labs    06/18/2021 1603 06/16/2021 1611 06/28/21 0350 06/28/21   0358  NA 151*   < > 152* 155*  K 4.6   < > 4.3 4.2  CL 113*  --  115*  --   CO2 33*  --  31  --   GLUCOSE 116*  --  121*  --   BUN 119*  --  118*  --   CREATININE 1.77*  --  1.75*  --   CALCIUM 8.3*  --  8.5*  --    < > = values in this interval not displayed.   Liver Function Tests Recent Labs    07/04/2021 0343 06/28/21 0350  AST 53* 63*  ALT 65* 64*  ALKPHOS 42 35*  BILITOT 0.6 0.7  PROT 5.2* 5.5*  ALBUMIN 1.5* 1.7*   No results for input(s): LIPASE, AMYLASE in the last 72 hours. Cardiac Enzymes No results for input(s): CKTOTAL, CKMB, CKMBINDEX, TROPONINI in the last 72 hours.  BNP: BNP (last 3 results) Recent Labs    10/29/20 1214  BNP 102.9*    ProBNP (last 3 results) No results for input(s): PROBNP in the last 8760  hours.   D-Dimer No results for input(s): DDIMER in the last 72 hours. Hemoglobin A1C No results for input(s): HGBA1C in the last 72 hours. Fasting Lipid Panel No results for input(s): CHOL, HDL, LDLCALC, TRIG, CHOLHDL, LDLDIRECT in the last 72 hours.  Thyroid Function Tests No results for input(s): TSH, T4TOTAL, T3FREE, THYROIDAB in the last 72 hours.  Invalid input(s): FREET3  Other results:   Imaging    No results found.   Medications:     Scheduled Medications:  amiodarone  200 mg Oral BID   arformoterol  15 mcg Nebulization BID   budesonide (PULMICORT) nebulizer solution  0.25 mg Nebulization BID   chlorhexidine gluconate (MEDLINE KIT)  15 mL Mouth Rinse BID   Chlorhexidine Gluconate Cloth  6 each Topical Daily   clonazePAM  1.5 mg Per Tube TID   emtricitabine-tenofovir AF  1 tablet Per Tube Daily   And   dolutegravir  50 mg Per Tube Daily   feeding supplement (PROSource TF)  45 mL Per Tube TID   free water  200 mL Per Tube Q4H   furosemide  60 mg Intravenous Q8H   mouth rinse  15 mL Mouth Rinse 10 times per day   methylPREDNISolone (SOLU-MEDROL) injection  40 mg Intravenous Daily   oxyCODONE  15 mg Per Tube Q6H   pantoprazole (PROTONIX) IV  40 mg Intravenous Q12H   polyethylene glycol  17 g Per Tube BID   QUEtiapine  100 mg Per Tube BID   revefenacin  175 mcg Nebulization Daily   rosuvastatin  20 mg Per Tube Daily   senna-docusate  1 tablet Per Tube BID   sodium chloride flush  10-40 mL Intracatheter Q12H   sodium chloride flush  3 mL Intravenous Q12H   valproic acid  250 mg Per Tube TID    Infusions:  sodium chloride     sodium chloride 10 mL/hr at 06/23/2021 1502   sodium chloride     sodium chloride Stopped (07/03/2021 1341)   sodium chloride     sodium chloride     bivalirudin (ANGIOMAX) infusion 0.5 mg/mL (Non-ACS indications) 0.18 mg/kg/hr (06/28/21 0700)   ceFEPime (MAXIPIME) IV Stopped (06/28/21 0612)   dexmedetomidine (PRECEDEX) IV infusion  1.7 mcg/kg/hr (06/28/21 0700)   feeding supplement (PIVOT 1.5 CAL) Stopped (06/18/2021 1330)   HYDROmorphone 4 mg/hr (06/28/21 0700)   insulin 2.6 Units/hr (06/28/21 0700)     norepinephrine (LEVOPHED) Adult infusion 2 mcg/min (06/28/21 0700)   vancomycin Stopped (06/19/2021 2206)   vasopressin 0.03 Units/min (06/28/21 0700)    PRN Medications: sodium chloride, Place/Maintain arterial line **AND** sodium chloride, acetaminophen **OR** acetaminophen, albuterol, bisacodyl, dextrose, docusate, HYDROmorphone, midazolam, ondansetron **OR** ondansetron (ZOFRAN) IV, sodium chloride flush, sodium chloride flush   Assessment/Plan   1. Acute hypoxemic and hypercarbic respiratory failure: In setting of COPD on home oxygen at baseline.  He has severe influenza infection.  Refractory hypoxemia and hypercarbia with maximal management.  He has underlying significant lung disease but has reasonable quality of life and had ARDS in the past requiring tracheostomy but was able to wean off.  RESP score was acceptable.  VV ECMO cannulation on 11/7.  Pseudomonas and MSSA in trach aspirate from 11/7.  Tracheostomy 11/10.  This morning, lactate is 0.8.  ABG 7.35/64/92/96%.  Sweep at 3, stable overnight.  LDH stable 281.  Good UOP with IV Lasix, net negative.  BUN now markedly elevated, suspect in part due to UGIB (trending down now).  Need negative I/Os with significant weight gain, now trending back down.  - Continue incremental/gradual sweep wean => accept pH as low as 7.25 if patient tolerates (gas this morning acceptable). Dr. Agarwala to adjust vent settings to try to aggressively wean sweep this morning, dropped to 2.  - Bivalirudin for PTT 50-70 (lowered goal with UGIB).  - Give Lasix 60 mg IV every 8 hrs again today. - Antibiotic coverage with cefepime - Solumedrol IV  - Anticipate prolonged course.  2. Atrial fibrillation: He has been in atrial fibrillation on all ECGs since 2021, suspect chronic at this point. On  Eliquis at home. Soft BP has limited metoprolol.  - Will manage on bivalirudin.  - Continue po amiodarone.    - Consider cardioversion in future.  3.  AKI: Creatinine 1.6 => 2.37 => 1.85 => 1.63 => 1.27 => 1.31 => 1.43 => 1.64 => 1.6 => 1.81 => 1.69 => 1.75 with BUN 118.  Suspect UGIB plays role in high BUN.  - Lasix 60 mg IV every 8 hrs today.  4. Type 2 DM: Still on insulin gtt.  5. Influenza: Severe.  Completed oseltamivir.  6. COPD: Still smokes 1-2 cigs/day.  Intermittent home oxygen.  Followed by pulmonary, has been stable at home for years but baseline DLCO was very low.  7. HIV: Last viral load was undetectable.  On treatment.  8. Shock: Septic + hemorrhagic.  Echo showed EF 55-60% with normal RV. This morning on NE 2, vasopressin 0.03.  Improved with resolution of UGIB.  - Wean pressors as able.  9. FEN: Tube feeds.  10. 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  Hgb 9.1 this morning.  - BID PPI - Transfuse hgb < 8.  11. Hypernatremia: Na 152 today. Has missed free water boluses with Cortrak out.  - Continue current free water boluses with replacement of Cortrak.     CRITICAL CARE Performed by:    Total critical care time: 40 minutes  Critical care time was exclusive of separately billable procedures and treating other patients.  Critical care was necessary to treat or prevent imminent or life-threatening deterioration.  Critical care was time spent personally by me on the following activities: development of treatment plan with patient and/or surrogate as well as nursing, discussions with consultants, evaluation of patient's response to treatment, examination of patient, obtaining history from patient or surrogate, ordering and performing treatments and interventions, ordering   and review of laboratory studies, ordering and review of radiographic studies, pulse oximetry and re-evaluation of patient's condition.     Length of Stay: 15   , MD   06/28/2021, 8:08 AM  Advanced Heart Failure Team Pager [phone] (M-F; 7a - 5p)  Please contact [facility] for night-coverage after hours (5p -7a ) and weekends on [email or web address]     

## 2021-06-28 NOTE — Progress Notes (Signed)
Patient ID: [name]., male   DOB: 1966-08-04, 55 y.o.   MRN: [id number]  Extracorporeal support note     ECLS support day: 11 Indication: Severe COPD with influenza A   Configuration: VV ECMO, Crescent catheter right IJ   Drainage cannula: Right IJ Crescent Return cannula: Right IJ Crescent   Pump speed: 3500 Pump flow: 4.66 Pump used: Cardiohelp   Sweep gas: 3   Circuit check: Small area of thrombus 3 o'clock  Anticoagulant: bivalirudin Anticoagulation target: PTT 50-70 (decreased post-GI bleed)   Changes in support: Try to drop sweep incrementally, tolerate pH down to as low as 7.25    Anticipated goals/duration of support: wean to decannulation  [name] 06/28/2021 8:07 AM

## 2021-06-28 NOTE — Plan of Care (Signed)
  Problem: Clinical Measurements: Goal: Cardiovascular complication will be avoided Outcome: Progressing   Problem: Pain Managment: Goal: General experience of comfort will improve Outcome: Progressing   Problem: Safety: Goal: Ability to remain free from injury will improve Outcome: Progressing   Problem: Skin Integrity: Goal: Risk for impaired skin integrity will decrease Outcome: Progressing   Problem: Safety: Goal: Non-violent Restraint(s) Outcome: Progressing   Problem: Respiratory: Goal: Ability to maintain a clear airway and adequate ventilation will improve Outcome: Progressing   Problem: Education: Goal: Knowledge of General Education information will improve Description: Including pain rating scale, medication(s)/side effects and non-pharmacologic comfort measures Outcome: Not Progressing

## 2021-06-28 NOTE — Progress Notes (Signed)
eLink Physician-Brief Progress Note Patient Name: Chris [name]. DOB: [birth year] MRN: [id number]   Date of Service  06/28/2021  HPI/Events of Note  Patient has a tracheostomy and other important lines and tubes, and is at risk of self-disconnecting tracheostomy of interrupting his lines.  eICU Interventions  Bilateral soft wrist restraints order renewed.        Thomasene Lot Mellie Buccellato 06/28/2021, 12:24 AM

## 2021-06-28 NOTE — Progress Notes (Signed)
ANTICOAGULATION CONSULT NOTE   Pharmacy Consult for Bivalirudin Indication:  ECMO and atrial fibrillation  Allergies  Allergen Reactions   Bactrim [Sulfamethoxazole-Trimethoprim] Hives    Patient Measurements: Height: 5\' 9"  (175.3 cm) Weight: 116.2 kg (256 lb 2.8 oz) IBW/kg (Calculated) : 70.7  Vital Signs: Temp: 98.4 F (36.9 C) (11/18 0200) Temp Source: Core (11/18 0200) BP: 106/69 (11/17 2322) Pulse Rate: 87 (11/18 0318)  Labs: Recent Labs    06/26/21 0349 06/26/21 0357 July 27, 2021 0343 2021-07-27 0352 07/27/2021 1603 07-27-2021 1611 07/27/2021 2200 07-27-2021 2208 06/28/21 0350 06/28/21 0358  HGB 7.7*   < > 6.8*   < > 7.7*   < >  --  8.8* 9.1* 8.8*  HCT 25.6*   < > 22.4*   < > 25.4*   < >  --  26.0* 27.9* 26.0*  PLT 151   < > 156  --  151  --   --   --  131*  --   APTT 74*   < > 62*  --  34  --  56*  --  55*  --   LABPROT 23.6*  --  23.1*  --   --   --   --   --  21.6*  --   INR 2.1*  --  2.1*  --   --   --   --   --  1.9*  --   CREATININE 1.81*   < > 1.69*  --  1.77*  --   --   --  1.75*  --    < > = values in this interval not displayed.     Estimated Creatinine Clearance: 60 mL/min (A) (by C-G formula based on SCr of 1.75 mg/dL (H)).   Medical History: Past Medical History:  Diagnosis Date   Anxiety    Atrial fibrillation (HCC)    CHF (congestive heart failure) (HCC)    COPD (chronic obstructive pulmonary disease) (HCC)    Emphysema [[name]]   Depression    Diabetes mellitus without complication (HCC)    type 2   Emphysema of lung (HCC)    GERD (gastroesophageal reflux disease)    HIV disease (HCC)    Hypertension    Oxygen deficiency     Assessment: 55 Y/O male on chronic apixaban for atrial fibrillation. Previous placed on heparin in anticipation of line placement and possible procedures. Last apixaban dose given @0948  11/7. Patient cannulated and ECMO initiated 11/7; received a heparin bolus at cannulation, then switched to bivalrudin infusion. Pharmacy  to dose bivalirudin.  Bivalirudin 11/17 for bleeding and occult + stool.  Colonoscopy + bleeding AVM> clipped, and bivalirudin resumed after procedure.  APTT is within new lower goal range this morning.  Stools still maroon per nursing staff, oral bleeding resolved.  Goal of Therapy:  aPTT 50-70 secs Monitor platelets by anticoagulation protocol: Yes   Plan:  Continue bivalirudin at 0.18 mg/kg/hr. Monitor q12h Aptt and CBC, s/sx bleeding Continue to trend Hgb after GI procedure yesterday.  13/7, 12/17, BCCP Clinical Pharmacist  06/28/2021 7:13 AM   [facility] pharmacy phone numbers are listed on [email or web address]

## 2021-06-28 NOTE — Progress Notes (Signed)
Inpatient Diabetes Program Recommendations  AACE/[name]: New Consensus Statement on Inpatient Glycemic Control (2015)  Target Ranges:  Prepandial:   less than 140 mg/dL      Peak postprandial:   less than 180 mg/dL (1-2 hours)      Critically ill patients:  140 - 180 mg/dL    Latest Reference Range & Units 06/28/21 01:58 06/28/21 02:54 06/28/21 03:54 06/28/21 04:59 06/28/21 05:56 06/28/21 06:45 06/28/21 08:18 06/28/21 09:03 06/28/21 10:07 06/28/21 11:25  Glucose-Capillary 70 - 99 mg/dL 166 (H) 060 (H) 045 (H) 105 (H) 124 (H) 132 (H) 129 (H) 139 (H) 139 (H) 147 (H)  (H): Data is abnormally high    Home DM Meds: Metformin 1000 mg BID       Victoza 1.8 mg daily  Current Orders: IV Insulin Drip     MD- Note the following: CBGs are less than 180 Insulin Drip rates <8 units/hr Tube feeds are at goal rate  Pt meets criteria to transition to SQ Insulin  If you decide to transition today, please have the RN follow Phase 3 of the ICU Glycemic control order set for Levemir and Novolog orders     --Will follow patient during hospitalization--  Ambrose Finland RN, MSN, CDE Diabetes Coordinator Inpatient Glycemic Control Team Team Pager: [phone] (8a-5p)

## 2021-06-28 NOTE — Progress Notes (Signed)
PT Cancellation Note  Patient Details Name: Chris [name]. MRN: [id number] DOB: 1965/11/16   Cancelled Treatment:    Reason Eval/Treat Not Completed: Medical issues which prohibited therapy (Per nurse, pt is not appropriate for PT to work with him today. Will check back at later date.)   Bevelyn Buckles 06/28/2021, 10:37 AM Edwin Cherian M,PT Acute Rehab Services [phone] (pager)

## 2021-06-28 NOTE — Plan of Care (Signed)
  Problem: Education: Goal: Knowledge of General Education information will improve Description: Including pain rating scale, medication(s)/side effects and non-pharmacologic comfort measures Outcome: Progressing   Problem: Clinical Measurements: Goal: Ability to maintain clinical measurements within normal limits will improve Outcome: Progressing Goal: Will remain free from infection Outcome: Progressing Goal: Diagnostic test results will improve Outcome: Progressing Goal: Respiratory complications will improve Outcome: Progressing Goal: Cardiovascular complication will be avoided Outcome: Progressing   Problem: Nutrition: Goal: Adequate nutrition will be maintained Outcome: Progressing   Problem: Elimination: Goal: Will not experience complications related to bowel motility Outcome: Progressing Goal: Will not experience complications related to urinary retention Outcome: Progressing   

## 2021-06-28 NOTE — Progress Notes (Addendum)
Approx [id number]  Episode of anxiety/agitation, pulling at restraints, turning head, attempting to sit up, does not follow commands but attempting to mouth words. ECMO specialist and RN at bedside providing reassurance and stabilizing ECMO catheter . Remains on dilaudid gtt and precedex gtt (titrated up during shift). Earlier episodes of agitation responded to dilaudid 1 mg bolus, minimal effect this time. Versed 2 mg IV given per prn orders. Pt now appropriately sedated. No complications at cannula site.   Cortrak removed during EGD yesterday, per tube meds (unable to give klonopin, Seroquel) held overnight per team with plan to address/replace enteral access today.

## 2021-06-29 ENCOUNTER — Inpatient Hospital Stay (HOSPITAL_COMMUNITY): Payer: Medicare HMO

## 2021-06-29 DIAGNOSIS — J101 Influenza due to other identified influenza virus with other respiratory manifestations: Secondary | ICD-10-CM | POA: Diagnosis not present

## 2021-06-29 DIAGNOSIS — N179 Acute kidney failure, unspecified: Secondary | ICD-10-CM | POA: Diagnosis not present

## 2021-06-29 DIAGNOSIS — I4891 Unspecified atrial fibrillation: Secondary | ICD-10-CM | POA: Diagnosis not present

## 2021-06-29 DIAGNOSIS — J80 Acute respiratory distress syndrome: Secondary | ICD-10-CM | POA: Diagnosis not present

## 2021-06-29 LAB — POCT I-STAT 7, (LYTES, BLD GAS, ICA,H+H)
Acid-Base Excess: 10 mmol/L — ABNORMAL HIGH (ref 0.0–2.0)
Acid-Base Excess: 10 mmol/L — ABNORMAL HIGH (ref 0.0–2.0)
Acid-Base Excess: 8 mmol/L — ABNORMAL HIGH (ref 0.0–2.0)
Acid-Base Excess: 9 mmol/L — ABNORMAL HIGH (ref 0.0–2.0)
Acid-Base Excess: 9 mmol/L — ABNORMAL HIGH (ref 0.0–2.0)
Acid-Base Excess: 9 mmol/L — ABNORMAL HIGH (ref 0.0–2.0)
Bicarbonate: 34.5 mmol/L — ABNORMAL HIGH (ref 20.0–28.0)
Bicarbonate: 35.6 mmol/L — ABNORMAL HIGH (ref 20.0–28.0)
Bicarbonate: 35.8 mmol/L — ABNORMAL HIGH (ref 20.0–28.0)
Bicarbonate: 35.8 mmol/L — ABNORMAL HIGH (ref 20.0–28.0)
Bicarbonate: 35.9 mmol/L — ABNORMAL HIGH (ref 20.0–28.0)
Bicarbonate: 36.1 mmol/L — ABNORMAL HIGH (ref 20.0–28.0)
Calcium, Ion: 1.18 mmol/L (ref 1.15–1.40)
Calcium, Ion: 1.19 mmol/L (ref 1.15–1.40)
Calcium, Ion: 1.2 mmol/L (ref 1.15–1.40)
Calcium, Ion: 1.25 mmol/L (ref 1.15–1.40)
Calcium, Ion: 1.29 mmol/L (ref 1.15–1.40)
Calcium, Ion: 1.29 mmol/L (ref 1.15–1.40)
HCT: 19 % — ABNORMAL LOW (ref 39.0–52.0)
HCT: 22 % — ABNORMAL LOW (ref 39.0–52.0)
HCT: 24 % — ABNORMAL LOW (ref 39.0–52.0)
HCT: 24 % — ABNORMAL LOW (ref 39.0–52.0)
HCT: 26 % — ABNORMAL LOW (ref 39.0–52.0)
HCT: 27 % — ABNORMAL LOW (ref 39.0–52.0)
Hemoglobin: 6.5 g/dL — CL (ref 13.0–17.0)
Hemoglobin: 7.5 g/dL — ABNORMAL LOW (ref 13.0–17.0)
Hemoglobin: 8.2 g/dL — ABNORMAL LOW (ref 13.0–17.0)
Hemoglobin: 8.2 g/dL — ABNORMAL LOW (ref 13.0–17.0)
Hemoglobin: 8.8 g/dL — ABNORMAL LOW (ref 13.0–17.0)
Hemoglobin: 9.2 g/dL — ABNORMAL LOW (ref 13.0–17.0)
O2 Saturation: 86 %
O2 Saturation: 93 %
O2 Saturation: 93 %
O2 Saturation: 94 %
O2 Saturation: 94 %
O2 Saturation: 95 %
Patient temperature: 36.9
Patient temperature: 36.9
Patient temperature: 37
Patient temperature: 37.2
Patient temperature: 37.2
Patient temperature: 37.3
Potassium: 3.8 mmol/L (ref 3.5–5.1)
Potassium: 3.9 mmol/L (ref 3.5–5.1)
Potassium: 4.4 mmol/L (ref 3.5–5.1)
Potassium: 4.4 mmol/L (ref 3.5–5.1)
Potassium: 4.6 mmol/L (ref 3.5–5.1)
Potassium: 5 mmol/L (ref 3.5–5.1)
Sodium: 155 mmol/L — ABNORMAL HIGH (ref 135–145)
Sodium: 155 mmol/L — ABNORMAL HIGH (ref 135–145)
Sodium: 156 mmol/L — ABNORMAL HIGH (ref 135–145)
Sodium: 156 mmol/L — ABNORMAL HIGH (ref 135–145)
Sodium: 157 mmol/L — ABNORMAL HIGH (ref 135–145)
Sodium: 158 mmol/L — ABNORMAL HIGH (ref 135–145)
TCO2: 36 mmol/L — ABNORMAL HIGH (ref 22–32)
TCO2: 37 mmol/L — ABNORMAL HIGH (ref 22–32)
TCO2: 38 mmol/L — ABNORMAL HIGH (ref 22–32)
TCO2: 38 mmol/L — ABNORMAL HIGH (ref 22–32)
TCO2: 38 mmol/L — ABNORMAL HIGH (ref 22–32)
TCO2: 38 mmol/L — ABNORMAL HIGH (ref 22–32)
pCO2 arterial: 58.9 mmHg — ABNORMAL HIGH (ref 32.0–48.0)
pCO2 arterial: 59 mmHg — ABNORMAL HIGH (ref 32.0–48.0)
pCO2 arterial: 60.6 mmHg — ABNORMAL HIGH (ref 32.0–48.0)
pCO2 arterial: 61.6 mmHg — ABNORMAL HIGH (ref 32.0–48.0)
pCO2 arterial: 61.9 mmHg — ABNORMAL HIGH (ref 32.0–48.0)
pCO2 arterial: 62.5 mmHg — ABNORMAL HIGH (ref 32.0–48.0)
pH, Arterial: 7.362 (ref 7.350–7.450)
pH, Arterial: 7.367 (ref 7.350–7.450)
pH, Arterial: 7.374 (ref 7.350–7.450)
pH, Arterial: 7.375 (ref 7.350–7.450)
pH, Arterial: 7.388 (ref 7.350–7.450)
pH, Arterial: 7.392 (ref 7.350–7.450)
pO2, Arterial: 53 mmHg — ABNORMAL LOW (ref 83.0–108.0)
pO2, Arterial: 71 mmHg — ABNORMAL LOW (ref 83.0–108.0)
pO2, Arterial: 73 mmHg — ABNORMAL LOW (ref 83.0–108.0)
pO2, Arterial: 74 mmHg — ABNORMAL LOW (ref 83.0–108.0)
pO2, Arterial: 78 mmHg — ABNORMAL LOW (ref 83.0–108.0)
pO2, Arterial: 81 mmHg — ABNORMAL LOW (ref 83.0–108.0)

## 2021-06-29 LAB — GLUCOSE, CAPILLARY
Glucose-Capillary: 211 mg/dL — ABNORMAL HIGH (ref 70–99)
Glucose-Capillary: 216 mg/dL — ABNORMAL HIGH (ref 70–99)
Glucose-Capillary: 179 mg/dL — ABNORMAL HIGH (ref 70–99)
Glucose-Capillary: 154 mg/dL — ABNORMAL HIGH (ref 70–99)
Glucose-Capillary: 152 mg/dL — ABNORMAL HIGH (ref 70–99)
Glucose-Capillary: 159 mg/dL — ABNORMAL HIGH (ref 70–99)
Glucose-Capillary: 175 mg/dL — ABNORMAL HIGH (ref 70–99)
Glucose-Capillary: 205 mg/dL — ABNORMAL HIGH (ref 70–99)
Glucose-Capillary: 191 mg/dL — ABNORMAL HIGH (ref 70–99)
Glucose-Capillary: 199 mg/dL — ABNORMAL HIGH (ref 70–99)
Glucose-Capillary: 202 mg/dL — ABNORMAL HIGH (ref 70–99)
Glucose-Capillary: 183 mg/dL — ABNORMAL HIGH (ref 70–99)
Glucose-Capillary: 172 mg/dL — ABNORMAL HIGH (ref 70–99)
Glucose-Capillary: 165 mg/dL — ABNORMAL HIGH (ref 70–99)
Glucose-Capillary: 151 mg/dL — ABNORMAL HIGH (ref 70–99)
Glucose-Capillary: 173 mg/dL — ABNORMAL HIGH (ref 70–99)
Glucose-Capillary: 85 mg/dL (ref 70–99)
Glucose-Capillary: 101 mg/dL — ABNORMAL HIGH (ref 70–99)
Glucose-Capillary: 128 mg/dL — ABNORMAL HIGH (ref 70–99)
Glucose-Capillary: 131 mg/dL — ABNORMAL HIGH (ref 70–99)
Glucose-Capillary: 156 mg/dL — ABNORMAL HIGH (ref 70–99)
Glucose-Capillary: 142 mg/dL — ABNORMAL HIGH (ref 70–99)

## 2021-06-29 LAB — CBC
HCT: 26.3 % — ABNORMAL LOW (ref 39.0–52.0)
HCT: 24.3 % — ABNORMAL LOW (ref 39.0–52.0)
Hemoglobin: 8.1 g/dL — ABNORMAL LOW (ref 13.0–17.0)
Hemoglobin: 7.6 g/dL — ABNORMAL LOW (ref 13.0–17.0)
MCH: 29.7 pg (ref 26.0–34.0)
MCH: 29.7 pg (ref 26.0–34.0)
MCHC: 30.8 g/dL (ref 30.0–36.0)
MCHC: 31.3 g/dL (ref 30.0–36.0)
MCV: 96.3 fL (ref 80.0–100.0)
MCV: 94.9 fL (ref 80.0–100.0)
Platelets: 125 10*3/uL — ABNORMAL LOW (ref 150–400)
Platelets: 104 10*3/uL — ABNORMAL LOW (ref 150–400)
RBC: 2.73 MIL/uL — ABNORMAL LOW (ref 4.22–5.81)
RBC: 2.56 MIL/uL — ABNORMAL LOW (ref 4.22–5.81)
RDW: 18.6 % — ABNORMAL HIGH (ref 11.5–15.5)
RDW: 18.6 % — ABNORMAL HIGH (ref 11.5–15.5)
WBC: 15.1 10*3/uL — ABNORMAL HIGH (ref 4.0–10.5)
WBC: 14.7 10*3/uL — ABNORMAL HIGH (ref 4.0–10.5)
nRBC: 5.6 % — ABNORMAL HIGH (ref 0.0–0.2)
nRBC: 5.3 % — ABNORMAL HIGH (ref 0.0–0.2)

## 2021-06-29 LAB — HEMOGLOBIN AND HEMATOCRIT, BLOOD
HCT: 30.2 % — ABNORMAL LOW (ref 39.0–52.0)
HCT: 28.4 % — ABNORMAL LOW (ref 39.0–52.0)
Hemoglobin: 9.4 g/dL — ABNORMAL LOW (ref 13.0–17.0)
Hemoglobin: 9 g/dL — ABNORMAL LOW (ref 13.0–17.0)

## 2021-06-29 LAB — BASIC METABOLIC PANEL
Anion gap: 5 (ref 5–15)
Anion gap: 5 (ref 5–15)
BUN: 111 mg/dL — ABNORMAL HIGH (ref 6–20)
BUN: 108 mg/dL — ABNORMAL HIGH (ref 6–20)
CO2: 33 mmol/L — ABNORMAL HIGH (ref 22–32)
CO2: 34 mmol/L — ABNORMAL HIGH (ref 22–32)
Calcium: 8.7 mg/dL — ABNORMAL LOW (ref 8.9–10.3)
Calcium: 7.8 mg/dL — ABNORMAL LOW (ref 8.9–10.3)
Chloride: 116 mmol/L — ABNORMAL HIGH (ref 98–111)
Chloride: 117 mmol/L — ABNORMAL HIGH (ref 98–111)
Creatinine, Ser: 1.64 mg/dL — ABNORMAL HIGH (ref 0.61–1.24)
Creatinine, Ser: 1.52 mg/dL — ABNORMAL HIGH (ref 0.61–1.24)
GFR, Estimated: 49 mL/min — ABNORMAL LOW (ref >60)
GFR, Estimated: 54 mL/min — ABNORMAL LOW (ref >60)
Glucose, Bld: 168 mg/dL — ABNORMAL HIGH (ref 70–99)
Glucose, Bld: 177 mg/dL — ABNORMAL HIGH (ref 70–99)
Potassium: 3.7 mmol/L (ref 3.5–5.1)
Potassium: 4.5 mmol/L (ref 3.5–5.1)
Sodium: 154 mmol/L — ABNORMAL HIGH (ref 135–145)
Sodium: 156 mmol/L — ABNORMAL HIGH (ref 135–145)

## 2021-06-29 LAB — LACTATE DEHYDROGENASE: LDH: 293 U/L — ABNORMAL HIGH (ref 98–192)

## 2021-06-29 LAB — HEPATIC FUNCTION PANEL
ALT: 80 U/L — ABNORMAL HIGH (ref 0–44)
AST: 87 U/L — ABNORMAL HIGH (ref 15–41)
Albumin: 1.7 g/dL — ABNORMAL LOW (ref 3.5–5.0)
Alkaline Phosphatase: 35 U/L — ABNORMAL LOW (ref 38–126)
Bilirubin, Direct: <0.1 mg/dL (ref 0.0–0.2)
Total Bilirubin: 0.6 mg/dL (ref 0.3–1.2)
Total Protein: 5.6 g/dL — ABNORMAL LOW (ref 6.5–8.1)

## 2021-06-29 LAB — APTT
aPTT: 51 s — ABNORMAL HIGH (ref 24–36)
aPTT: 60 seconds — ABNORMAL HIGH (ref 24–36)

## 2021-06-29 LAB — PREPARE RBC (CROSSMATCH)

## 2021-06-29 LAB — PROTIME-INR
INR: 1.9 — ABNORMAL HIGH (ref 0.8–1.2)
Prothrombin Time: 21.6 seconds — ABNORMAL HIGH (ref 11.4–15.2)

## 2021-06-29 LAB — LACTIC ACID, PLASMA: Lactic Acid, Venous: 0.8 mmol/L (ref 0.5–1.9)

## 2021-06-29 LAB — FIBRINOGEN: Fibrinogen: 586 mg/dL — ABNORMAL HIGH (ref 210–475)

## 2021-06-29 MED ORDER — POTASSIUM CHLORIDE 20 MEQ PO PACK
20.0000 meq | PACK | Freq: Once | ORAL | Status: AC
  Administered 2021-06-29: 20 meq
  Filled 2021-06-29: qty 1

## 2021-06-29 MED ORDER — AMIODARONE HCL IN DEXTROSE 360-4.14 MG/200ML-% IV SOLN
INTRAVENOUS | Status: AC
  Administered 2021-06-29: 60 mg/h via INTRAVENOUS
  Filled 2021-06-29: qty 400

## 2021-06-29 MED ORDER — IPRATROPIUM-ALBUTEROL 0.5-2.5 (3) MG/3ML IN SOLN
3.0000 mL | RESPIRATORY_TRACT | Status: DC
Start: 2021-06-29 — End: 2021-07-05
  Administered 2021-06-29 – 2021-07-05 (×34): 3 mL via RESPIRATORY_TRACT
  Filled 2021-06-29 (×34): qty 3

## 2021-06-29 MED ORDER — AMIODARONE HCL IN DEXTROSE 360-4.14 MG/200ML-% IV SOLN
60.0000 mg/h | INTRAVENOUS | Status: AC
  Administered 2021-06-30: 60 mg/h via INTRAVENOUS

## 2021-06-29 MED ORDER — SODIUM CHLORIDE 0.9% IV SOLUTION: Freq: Once | INTRAVENOUS | Status: AC

## 2021-06-29 MED ORDER — AMIODARONE IV BOLUS ONLY 150 MG/100ML
150.0000 mg | Freq: Once | INTRAVENOUS | Status: AC
  Administered 2021-06-29: 150 mg via INTRAVENOUS
  Filled 2021-06-29: qty 100

## 2021-06-29 MED ORDER — AMIODARONE LOAD VIA INFUSION
150.0000 mg | Freq: Once | INTRAVENOUS | Status: AC
  Administered 2021-06-29: 150 mg via INTRAVENOUS
  Filled 2021-06-29: qty 83.34

## 2021-06-29 MED ORDER — FUROSEMIDE 10 MG/ML IJ SOLN
60.0000 mg | Freq: Once | INTRAMUSCULAR | Status: AC
  Administered 2021-06-30: 60 mg via INTRAVENOUS
  Filled 2021-06-29: qty 6

## 2021-06-29 MED ORDER — AMIODARONE HCL IN DEXTROSE 360-4.14 MG/200ML-% IV SOLN
30.0000 mg/h | INTRAVENOUS | Status: DC
  Administered 2021-06-30 – 2021-07-05 (×11): 30 mg/h via INTRAVENOUS
  Filled 2021-06-29 (×12): qty 200

## 2021-06-29 MED ORDER — FUROSEMIDE 10 MG/ML IJ SOLN
60.0000 mg | Freq: Two times a day (BID) | INTRAMUSCULAR | Status: DC
  Administered 2021-06-29 – 2021-06-30 (×2): 60 mg via INTRAVENOUS
  Filled 2021-06-29 (×3): qty 6

## 2021-06-29 NOTE — Progress Notes (Signed)
Remains tachycardic/Afib RVR 140-170. BP stable, slowly weaning levo gtt. Awake, sedation restarted and dilaudid incr/bolus (see MAR).   Dr Charolotte Eke updated by Marcelino Duster, ECMO specialist. Order for amio bolus and gtt.

## 2021-06-29 NOTE — Progress Notes (Signed)
Patient ID: Chris [name]., male   DOB: [birth year], 55 y.o.   MRN: [id number]     Advanced Heart Failure Rounding Note  PCP-Cardiologist: Glenetta Hew, MD   Subjective:    11/7: VV ECMO cannulation 11/10: Tracheostomy 11/14: Lurline Idol exchange 11/17: EGD for UGIB, APC duodenal AVM 11/17  Trach aspirate 11/17 with Pseudomonas and MSSA, 11/13 trach aspirate still with Pseudomonas.  Oseltamivir for influenza completed.  On NE 6 and VP 0.03   Creatinine 1.75 -> 1.64 Very high BUN (117 -> 111) in setting of UGI bleed.  On lasix 60 IV bid. Excellent UOP with negative I/Os, weight coming down.  SNa 152 -> 154 getting FW  FiO2 0.5 on vent. Sweep down 3 -> 0.5   ECMO: 3500 rpm Flow 4.76 Pvenous -89 DeltaP 29 Sweep 0.5 ABG 7.37/62/78/94% PTT 51(goal 50-70), on bivalirudin Lactate 0.8 LDH 293  CXR: RLL airspace disease/effusion. Improving. Personally reviewed    Objective:   Weight Range: 115.5 kg Body mass index is 37.6 kg/m.   Vital Signs:   Temp:  [98.2 F (36.8 C)-98.4 F (36.9 C)] 98.4 F (36.9 C) (11/19 0745) Pulse Rate:  [87-116] 116 (11/19 0301) Resp:  [13-35] 19 (11/19 0745) BP: (89-111)/(48-57) 89/57 (11/18 1930) SpO2:  [90 %-100 %] 90 % (11/19 0745) Arterial Line BP: (90-143)/(45-61) 96/45 (11/19 0745) FiO2 (%):  [50 %-55 %] 55 % (11/19 0745) Weight:  [115.5 kg] 115.5 kg (11/19 0500) Last BM Date: 06/29/21  Weight change: Filed Weights   07/10/2021 0500 06/28/21 0301 06/29/21 0500  Weight: 117 kg 116.2 kg 115.5 kg    Intake/Output:   Intake/Output Summary (Last 24 hours) at 06/29/2021 0844 Last data filed at 06/29/2021 0700 Gross per 24 hour  Intake 5409.98 ml  Output 6400 ml  Net -990.02 ml       Physical Exam    General:  On vent through trach. Sedated HEENT: normal Neck: supple. RIJ crescent LIJ TLC + trach Carotids 2+ bilat; no bruits. No lymphadenopathy or thryomegaly appreciated. Cor: PMI nondisplaced. Irregular rate & rhythm. No rubs,  gallops or murmurs. Lungs: coarse Abdomen: soft, nontender, nondistended. No hepatosplenomegaly. No bruits or masses. Good bowel sounds. Extremities: no cyanosis, clubbing, rash, edema Neuro: sedated on vent   Telemetry   Atrial fibrillation 80s (personally reviewed)  Labs    CBC Recent Labs    06/28/21 1605 06/28/21 1615 06/29/21 0337 06/29/21 0556  WBC 18.3*  --  15.1*  --   HGB 8.7*   < > 8.1* 7.5*  HCT 27.1*   < > 26.3* 22.0*  MCV 94.8  --  96.3  --   PLT 131*  --  125*  --    < > = values in this interval not displayed.    Basic Metabolic Panel Recent Labs    06/28/21 1605 06/28/21 1615 06/29/21 0337 06/29/21 0556  NA 153*   < > 154* 155*  K 4.0   < > 3.7 3.9  CL 115*  --  116*  --   CO2 33*  --  33*  --   GLUCOSE 265*  --  168*  --   BUN 117*  --  111*  --   CREATININE 1.78*  --  1.64*  --   CALCIUM 8.7*  --  8.7*  --    < > = values in this interval not displayed.    Liver Function Tests Recent Labs    06/28/21 0350 06/29/21 0337  AST 63*  87*  ALT 64* 80*  ALKPHOS 35* 35*  BILITOT 0.7 0.6  PROT 5.5* 5.6*  ALBUMIN 1.7* 1.7*    No results for input(s): LIPASE, AMYLASE in the last 72 hours. Cardiac Enzymes No results for input(s): CKTOTAL, CKMB, CKMBINDEX, TROPONINI in the last 72 hours.  BNP: BNP (last 3 results) Recent Labs    10/29/20 1214  BNP 102.9*     ProBNP (last 3 results) No results for input(s): PROBNP in the last 8760 hours.   D-Dimer No results for input(s): DDIMER in the last 72 hours. Hemoglobin A1C No results for input(s): HGBA1C in the last 72 hours. Fasting Lipid Panel No results for input(s): CHOL, HDL, LDLCALC, TRIG, CHOLHDL, LDLDIRECT in the last 72 hours.  Thyroid Function Tests No results for input(s): TSH, T4TOTAL, T3FREE, THYROIDAB in the last 72 hours.  Invalid input(s): FREET3  Other results:   Imaging    DG Abd Portable 1V  Result Date: 06/28/2021 CLINICAL DATA:  Enteric tube placement  EXAM: PORTABLE ABDOMEN - 1 VIEW COMPARISON:  06/26/2021 FINDINGS: Tip of enteric tube is seen in the distal antrum of the stomach. There is moderate gaseous distention of stomach. There is a large caliber vascular sheath projecting in the course superior vena cava, right atrium and cephalad course of inferior vena cava with its tip below the level of right hemidiaphragm. IMPRESSION: Tip of enteric tube is seen in the distal antrum of the stomach. Electronically Signed   By: Elmer Picker M.D.   On: 06/28/2021 13:34     Medications:     Scheduled Medications:  amiodarone  200 mg Oral BID   arformoterol  15 mcg Nebulization BID   budesonide (PULMICORT) nebulizer solution  0.25 mg Nebulization BID   chlorhexidine gluconate (MEDLINE KIT)  15 mL Mouth Rinse BID   Chlorhexidine Gluconate Cloth  6 each Topical Daily   clonazePAM  1.5 mg Per Tube TID   emtricitabine-tenofovir AF  1 tablet Per Tube Daily   And   dolutegravir  50 mg Per Tube Daily   feeding supplement (PROSource TF)  45 mL Per Tube TID   free water  200 mL Per Tube Q4H   furosemide  60 mg Intravenous BID   mouth rinse  15 mL Mouth Rinse 10 times per day   oxyCODONE  15 mg Per Tube Q6H   pantoprazole (PROTONIX) IV  40 mg Intravenous Q12H   polyethylene glycol  17 g Per Tube BID   QUEtiapine  100 mg Per Tube BID   revefenacin  175 mcg Nebulization Daily   rosuvastatin  20 mg Per Tube Daily   senna-docusate  1 tablet Per Tube BID   sodium chloride flush  10-40 mL Intracatheter Q12H   sodium chloride flush  3 mL Intravenous Q12H   valproic acid  250 mg Per Tube TID    Infusions:  sodium chloride     sodium chloride 10 mL/hr at 07/04/2021 1502   sodium chloride     sodium chloride Stopped (06/26/2021 1341)   sodium chloride     sodium chloride     bivalirudin (ANGIOMAX) infusion 0.5 mg/mL (Non-ACS indications) 0.18 mg/kg/hr (06/29/21 0700)   ceFEPime (MAXIPIME) IV Stopped (06/29/21 0603)   dexmedetomidine (PRECEDEX) IV  infusion 1.5 mcg/kg/hr (06/29/21 0800)   feeding supplement (PIVOT 1.5 CAL) 1,000 mL (06/29/21 0236)   HYDROmorphone 3 mg/hr (06/29/21 0700)   insulin 11.5 Units/hr (06/29/21 0700)   norepinephrine (LEVOPHED) Adult infusion 6 mcg/min (06/29/21 0700)  vasopressin 0.03 Units/min (06/29/21 0843)    PRN Medications: sodium chloride, Place/Maintain arterial line **AND** sodium chloride, acetaminophen **OR** acetaminophen, albuterol, bisacodyl, dextrose, docusate, HYDROmorphone, midazolam, ondansetron **OR** ondansetron (ZOFRAN) IV, sodium chloride flush, sodium chloride flush   Assessment/Plan   1. Acute hypoxemic and hypercarbic respiratory failure: In setting of COPD on home oxygen at baseline.  He has severe influenza infection.  Refractory hypoxemia and hypercarbia with maximal management.  He has underlying significant lung disease but has reasonable quality of life and had ARDS in the past requiring tracheostomy but was able to wean off.  RESP score was acceptable.  VV ECMO cannulation on 11/7.  Pseudomonas and MSSA in trach aspirate from 11/7.  Tracheostomy 11/10.  This morning, lactate is 0.8.  ABG 7.35/64/92/96%.  Sweep at 0.54.  Good UOP with IV Lasix, net negative.  BUN now markedly elevated, suspect in part due to UGIB (trending down now).  Need negative I/Os with significant weight gain. Weight still up about 15 pounds from baseline - Continue incremental/gradual sweep wean =>  Vent adjusted and giving 1u RBCs today and will repeat sweep trial. D/w Dr. Lynetta Mare.  - Bivalirudin for PTT 50-70 (lowered goal with UGIB).  - Give Lasix 60 mg IV q12h today. - Antibiotic coverage with cefepime - Solumedrol IV  - Anticipate prolonged course.  2. Atrial fibrillation: He has been in atrial fibrillation on all ECGs since 2021, suspect chronic at this point. On Eliquis at home. Soft BP has limited metoprolol. Rate controlled on current regimen.  - Will manage on bivalirudin.  - Continue po  amiodarone.    - Consider cardioversion in future.  3.  AKI: Creatinine peaked at 2/37. Now stable at 1.6 - Lasix 60 mg IV every 12 hrs today.  - follow BMET 4. Type 2 DM: Still on insulin gtt.  5. Influenza: Severe.  Completed oseltamivir.  6. COPD: Still smokes 1-2 cigs/day.  Intermittent home oxygen.  Followed by pulmonary, has been stable at home for years but baseline DLCO was very low.  7. HIV: Last viral load was undetectable.  On treatment.  8. Shock: Septic + hemorrhagic.  Echo showed EF 55-60% with normal RV. This morning on NE 6, vasopressin 0.03.  Improved with resolution of UGIB.  - Continue to wean pressors as able.  9. FEN: Tube feeds.  10. 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  Hgb 8.1 this morning.  - BID PPI - Will give 1u RBCs today to facilitate ecmo wean 11. Hypernatremia: Na 154 today. - Continue current free water boluses.     CRITICAL CARE Performed by: Glori Bickers  Total critical care time: 40 minutes  Critical care time was exclusive of separately billable procedures and treating other patients.  Critical care was necessary to treat or prevent imminent or life-threatening deterioration.  Critical care was time spent personally by me on the following activities: development of treatment plan with patient and/or surrogate as well as nursing, discussions with consultants, evaluation of patient's response to treatment, examination of patient, obtaining history from patient or surrogate, ordering and performing treatments and interventions, ordering and review of laboratory studies, ordering and review of radiographic studies, pulse oximetry and re-evaluation of patient's condition.     Length of Stay: [place], MD  06/29/2021, 8:44 AM  Advanced Heart Failure Team Pager [phone] (M-F; 7a - 5p)  Please contact [facility] for night-coverage after hours (5p -7a ) and weekends on [email or web address]

## 2021-06-29 NOTE — Progress Notes (Signed)
Patient ID: Chris [name]., male   DOB: [birth year], 55 y.o.   MRN: [id number]  Extracorporeal support note     ECLS support day: 12 Indication: Severe COPD with influenza A   Configuration: VV ECMO, Crescent catheter right IJ   Drainage cannula: Right IJ Crescent Return cannula: Right IJ Crescent   Pump speed: 3500 Pump flow: 4.7 Pump used: Cardiohelp   Sweep gas: 0.5   Circuit check: Small area of thrombus 3 o'clock  (unchanged) Anticoagulant: bivalirudin Anticoagulation target: PTT 50-70 (decreased post-GI bleed)   Changes in support: Continue to wean sweep. Repeat sweep trial after transfusion and vent adjustment   Anticipated goals/duration of support: wean to decannulation  Arvilla Meres MD 06/29/2021 8:44 AM

## 2021-06-29 NOTE — Progress Notes (Addendum)
ANTICOAGULATION CONSULT NOTE   Pharmacy Consult for Bivalirudin Indication:  ECMO and atrial fibrillation  Allergies  Allergen Reactions   Bactrim [Sulfamethoxazole-Trimethoprim] Hives    Patient Measurements: Height: 5\' 9"  (175.3 cm) Weight: 115.5 kg (254 lb 10.1 oz) IBW/kg (Calculated) : 70.7  Vital Signs: Temp: 98.2 F (36.8 C) (11/19 0400) Temp Source: Core (11/19 0400) BP: 89/57 (11/18 1930) Pulse Rate: 116 (11/19 0301)  Labs: Recent Labs    06/12/2021 0343 06/26/2021 0352 06/28/21 0350 06/28/21 0358 06/28/21 1605 06/28/21 1615 06/29/21 0005 06/29/21 0337 06/29/21 0556  HGB 6.8*   < > 9.1*   < > 8.7*   < > 8.2* 8.1* 7.5*  HCT 22.4*   < > 27.9*   < > 27.1*   < > 24.0* 26.3* 22.0*  PLT 156   < > 131*  --  131*  --   --  125*  --   APTT 62*   < > 55*  --  56*  --   --  51*  --   LABPROT 23.1*  --  21.6*  --   --   --   --  21.6*  --   INR 2.1*  --  1.9*  --   --   --   --  1.9*  --   CREATININE 1.69*   < > 1.75*  --  1.78*  --   --  1.64*  --    < > = values in this interval not displayed.     Estimated Creatinine Clearance: 63.8 mL/min (A) (by C-G formula based on SCr of 1.64 mg/dL (H)).   Medical History: Past Medical History:  Diagnosis Date   Anxiety    Atrial fibrillation (HCC)    CHF (congestive heart failure) (HCC)    COPD (chronic obstructive pulmonary disease) (HCC)    Emphysema [[name]]   Depression    Diabetes mellitus without complication (HCC)    type 2   Emphysema of lung (HCC)    GERD (gastroesophageal reflux disease)    HIV disease (HCC)    Hypertension    Oxygen deficiency     Assessment: 55 Y/O male on chronic apixaban for atrial fibrillation. Previous placed on heparin in anticipation of line placement and possible procedures. Patient cannulated and ECMO initiated 11/7; received a heparin bolus at cannulation, then switched to bivalrudin infusion. Pharmacy to dose bivalirudin.  Bivalirudin stopped overnight 11/17 for bleeding and  occult + stool.  Colonoscopy noted bleeding AVM which was clipped, and bivalirudin resumed after procedure with slightly lower goal.  aPTT this morning is within goal range at 51 seconds, CBC is stable. Maroon stools ongoing but improving.  Goal of Therapy:  aPTT 50-70 secs Monitor platelets by anticoagulation protocol: Yes   Plan:  Continue bivalirudin at 0.18 mg/kg/hr Monitor q12h aptt and CBC, s/sx bleeding   12/17, PharmD, BCPS, [facility] Clinical Pharmacist [phone] Please check AMION for all [facility] Pharmacy numbers 06/29/2021

## 2021-06-29 NOTE — Progress Notes (Signed)
ANTICOAGULATION CONSULT NOTE   Pharmacy Consult for Bivalirudin Indication:  ECMO and atrial fibrillation  Allergies  Allergen Reactions   Bactrim [Sulfamethoxazole-Trimethoprim] Hives    Patient Measurements: Height: 5\' 9"  (175.3 cm) Weight: 115.5 kg (254 lb 10.1 oz) IBW/kg (Calculated) : 70.7  Vital Signs: Temp: 98.4 F (36.9 C) (11/19 1730) Temp Source: Core (11/19 1730) BP: 123/43 (11/19 1132) Pulse Rate: 130 (11/19 1132)  Labs: Recent Labs    07/08/2021 0343 07/09/2021 0352 06/28/21 0350 06/28/21 0358 06/28/21 1605 06/28/21 1615 06/29/21 0337 06/29/21 0556 06/29/21 1409 06/29/21 1411 06/29/21 1726  HGB 6.8*   < > 9.1*   < > 8.7*   < > 8.1*   < > 6.5* 7.6* 9.2*  HCT 22.4*   < > 27.9*   < > 27.1*   < > 26.3*   < > 19.0* 24.3* 27.0*  PLT 156   < > 131*  --  131*  --  125*  --   --  104*  --   APTT 62*   < > 55*  --  56*  --  51*  --   --  60*  --   LABPROT 23.1*  --  21.6*  --   --   --  21.6*  --   --   --   --   INR 2.1*  --  1.9*  --   --   --  1.9*  --   --   --   --   CREATININE 1.69*   < > 1.75*  --  1.78*  --  1.64*  --   --  1.52*  --    < > = values in this interval not displayed.     Estimated Creatinine Clearance: 68.8 mL/min (A) (by C-G formula based on SCr of 1.52 mg/dL (H)).   Medical History: Past Medical History:  Diagnosis Date   Anxiety    Atrial fibrillation (HCC)    CHF (congestive heart failure) (HCC)    COPD (chronic obstructive pulmonary disease) (HCC)    Emphysema [[name]]   Depression    Diabetes mellitus without complication (HCC)    type 2   Emphysema of lung (HCC)    GERD (gastroesophageal reflux disease)    HIV disease (HCC)    Hypertension    Oxygen deficiency     Assessment: 55 Y/O male on chronic apixaban for atrial fibrillation. Previous placed on heparin in anticipation of line placement and possible procedures. Patient cannulated and ECMO initiated 11/7; received a heparin bolus at cannulation, then switched to  bivalrudin infusion. Pharmacy to dose bivalirudin.  Bivalirudin stopped overnight 11/17 for bleeding and occult + stool.  Colonoscopy noted bleeding AVM which was clipped, and bivalirudin resumed after procedure with slightly lower goal.  aPTT this evening is within goal range at 60 seconds, while was on 0.18 mg/kg/hr. However, has had multiple bloody bowel movements requiring 4 PRBC today. Plan per CCM and GI was to hold bivalirudin at this time and monitor off anticoagulation for now. Still on sweep trial - no issues with circuit at this time.   Goal of Therapy:  aPTT 50-70 secs Monitor platelets by anticoagulation protocol: Yes   Plan:  Continue to hold bivalirudin with bleeding >> will follow up with plans if restarting anticoagulation in future  Monitor q12h aptt and CBC, s/sx bleeding  12/17, PharmD, BCCCP Clinical Pharmacist  Phone: [phone] 06/29/2021 6:05 PM  Please check AMION for all [facility] Pharmacy phone  numbers After 10:00 PM, call [place] [phone]

## 2021-06-29 NOTE — Progress Notes (Signed)
Wasted Dilaudid 30 mg with Romie Minus RN

## 2021-06-29 NOTE — Plan of Care (Signed)
  Problem: Clinical Measurements: Goal: Will remain free from infection Outcome: Progressing Goal: Respiratory complications will improve Outcome: Progressing Goal: Cardiovascular complication will be avoided Outcome: Progressing   Problem: Coping: Goal: Level of anxiety will decrease Outcome: Progressing   Problem: Elimination: Goal: Will not experience complications related to urinary retention Outcome: Progressing   Problem: Pain Managment: Goal: General experience of comfort will improve Outcome: Progressing   Problem: Safety: Goal: Ability to remain free from injury will improve Outcome: Progressing   Problem: Skin Integrity: Goal: Risk for impaired skin integrity will decrease Outcome: Progressing   Problem: Safety: Goal: Non-violent Restraint(s) Outcome: Progressing   Problem: Nutrition: Goal: Adequate nutrition will be maintained Outcome: Not Progressing   Problem: Elimination: Goal: Will not experience complications related to bowel motility Outcome: Not Progressing

## 2021-06-29 NOTE — Progress Notes (Signed)
Hgb stable at 9.0  Remains hypotensive and tachycardic, levo gtt @ 40. Weaning sedation.   Dr Charolotte Eke notified by Marcelino Duster, ECMO specialist. New orders to titrate for gaol SBP >/=100

## 2021-06-29 NOTE — Progress Notes (Signed)
NAME:  Chris Moriarty., MRN:  [id number], DOB:  [birth year], LOS: 62 ADMISSION DATE:  06/18/2021, CONSULTATION DATE:  06/15/2019 REFERRING MD:  Ruthann Cancer, CHIEF COMPLAINT:  hypercapnic respiratory failure   History of Present Illness:   55 year old man with O2 dependent COPD presenting with acute on chronic respiratory failure now on VV ECMO due to refractory hypoxia and hypercapnia.  Pertinent  Medical History   Past Medical History:  Diagnosis Date   Anxiety    Atrial fibrillation (HCC)    CHF (congestive heart failure) (HCC)    COPD (chronic obstructive pulmonary disease) ([place])    Emphysema [[name]]   Depression    Diabetes mellitus without complication ([place])    type 2   Emphysema of lung ([place])    GERD (gastroesophageal reflux disease)    HIV disease ([place])    Hypertension    Oxygen deficiency     Significant Hospital Events: Including procedures, antibiotic start and stop dates in addition to other pertinent events   11/7- VV ECMO started 11/9 - weaning sedation. Transfused 2 units PRBC to keep O2 carrying capacity.  11/9 - remains agitated with wake up  11/10 - tracheostomy tube placed 11/14 - trach exchanged to distal XLT due to cuff leak 11/17  - weaned sweep down to 1.5lpm prior to developing melena 11/17  - EGD bleeding duodenal bulb AVM injected and cauterized. Duodenal bulb ischemia. 11/19 initiating sweep trial  Interim History / Subjective:   Melena is improving.  Down to 0.5 L/min of sweep.  Still will wake up agitated periodically.  Objective   Blood pressure (!) 123/43, pulse (!) 130, temperature 98.4 F (36.9 C), temperature source Core, resp. rate 18, height 5\' 9"  (1.753 m), weight 115.5 kg, SpO2 100 %.    Vent Mode: PSV;CPAP FiO2 (%):  [50 %-80 %] 75 % Set Rate:  [25 bmp] 25 bmp Vt Set:  [560 mL] 560 mL PEEP:  [10 cmH20] 10 cmH20 Pressure Support:  [24 cmH20] 24 cmH20 Plateau Pressure:  [28 cmH20-31 cmH20] 30 cmH20   Intake/Output Summary (Last 24  hours) at 06/29/2021 1356 Last data filed at 06/29/2021 1330 Gross per 24 hour  Intake 7077.77 ml  Output 6600 ml  Net 477.77 ml    Filed Weights   07/07/2021 0500 06/28/21 0301 06/29/21 0500  Weight: 117 kg 116.2 kg 115.5 kg    Examination: General: critically ill appearing man lying in bed trached, sedated, on ecmo HEENT: [place]/AT, eyes anicteric Neck: no bleeding or drainage around trach, Shiley cuffed distal XLT #8 Resp: no wheezing or rhonchi, synchronous with MV now on PSV.  Patient breathing comfortably.  Wants to take large slow breaths consistent with his preintubation pattern. Cardio: S1S2, irreg rhythm, reg rate Neuro: RASS -3, intact cough reflex, PERRL Extremities: moderate dependent edema, no cyanosis GU: yellow urine, foley Derm: warm, dry, no rashes or ecchymoses  Ancillary tests:    CXR reviewed and shows both lung fields clear with cannula in good position.   ABG: mild compensated respiratory acidosis.  Leukocytosis improved Assessment & Plan:   Critically ill  due to acute on chronic hypoxic and hypercapnic respiratory failure from influenza pneumonia on background of severe COPD, prior tracheostomy. On HOT 10 years. Acute COPD exacerbation; severe underlying COPD- ARDS-   Pseudomonas and MSSA HAP Now trach dependent Concern for undiagnosed OSA Acute metabolic encephalopathy with severe agitation HIV, undetectable VL Distributive and hemorrhagic shock due to sedation Acute blood loss anemia due to  acute bleeding duodenal AVM Chronic Afib w/RVR H/o HFpEF GERD Type 2 diabetes with hyperglycemia Hypernatremia from high insensitive losses Deconditioning, acute on chronic  Plan:   -Initiate sweep trial today.  Continue PSV ventilation and allow patient to elect his own breathing pattern. -Transfused 2 units PRBC to increase oxygen carrying capacity.  Patient's hemoglobin was originally 14 at home consistent with compensatory polycythemia.  This should  improve sweep trial of tolerance. -Continue to slowly wean sedation to facilitate patient participation with work of breathing. -Attempt initial sweep trial today.  Aim to keep pH and PCO2 at current level. - Continue current bronchodilators.  - Complete current course of antibiotics.  - Continue current diuretic strategy - Continue ART - Remains on IV insulin.  Will stop steroids as likely contributing to both agititation and insulin resistance.    Best Practice (right click and "Reselect all SmartList Selections" daily)   Diet/type: tubefeeds  DVT prophylaxis: bivalirudin GI prophylaxis: PPI Lines: Central line and Arterial Line Foley:  external catheter Code Status:  full code Last date of multidisciplinary goals of care discussion: continue for family updates   CRITICAL CARE Performed by: [name]   Total critical care time: 50 minutes  Critical care time was exclusive of separately billable procedures and treating other patients.  Critical care was necessary to treat or prevent imminent or life-threatening deterioration.  Critical care was time spent personally by me on the following activities: development of treatment plan with patient and/or surrogate as well as nursing, discussions with consultants, evaluation of patient's response to treatment, examination of patient, obtaining history from patient or surrogate, ordering and performing treatments and interventions, ordering and review of laboratory studies, ordering and review of radiographic studies, pulse oximetry, re-evaluation of patient's condition and participation in multidisciplinary rounds.  [name], MD [facility] ICU Physician [facility]  Pager: [phone] Mobile: [phone] After hours: [phone].

## 2021-06-29 NOTE — Progress Notes (Signed)
Acute drop in BP, levo gtt titrated up, dilaudid and precedex gtts decreased (see MAR). Flexiseal assessed, no dramatic increase in bloody stool/output.   H/H checked by ECMO specialist  9.7 on ECMO machine 8.2 on bedside iSTAT.  Sending stat H/H.

## 2021-06-29 NOTE — Plan of Care (Signed)
  Problem: Clinical Measurements: Goal: Ability to maintain clinical measurements within normal limits will improve Outcome: Progressing Goal: Will remain free from infection Outcome: Progressing Goal: Respiratory complications will improve Outcome: Progressing Goal: Cardiovascular complication will be avoided Outcome: Progressing   Problem: Nutrition: Goal: Adequate nutrition will be maintained Outcome: Progressing   Problem: Pain Managment: Goal: General experience of comfort will improve Outcome: Progressing   Problem: Safety: Goal: Ability to remain free from injury will improve Outcome: Progressing   Problem: Skin Integrity: Goal: Risk for impaired skin integrity will decrease Outcome: Progressing   Problem: Safety: Goal: Non-violent Restraint(s) Outcome: Progressing

## 2021-06-29 NOTE — Progress Notes (Signed)
Pharmacy Antibiotic Note  Chris [name]. is a 55 y.o. male admitted on 07/08/2021 with pneumonia. Patient was started on BiPAP and found to be Influenza A positive, requiring intubation on 11/3. Patient cannulated and ECMO initiated on 11/7. Pharmacy consulted for cefepime and vancomycin dosing.  Empirically started on vancomycin and cefepime with ECMO cannulation (11/7) and narrowed to cefepime alone with trach aspirate (11/17) growing MSSA and pseudomonas. Repeat sputum culture (11/13) grew out few psuedomonas which was pan-sensitive.   Vancomycin now off, planning for cefepime through 11/21.  Plan: Cefepime 2g IV q8h  Temp (24hrs), Avg:98.2 F (36.8 C), Min:98.2 F (36.8 C), Max:98.4 F (36.9 C)  Recent Labs  Lab 06/25/21 1540 06/25/21 2145 06/26/21 0349 06/26/21 1600 06/24/2021 0343 06/11/2021 0348 07/01/2021 1603 06/28/21 0350 06/28/21 1605 06/29/21 0337  WBC 21.0*  --  22.7*   < > 26.6*  --  29.9* 23.0* 18.3* 15.1*  CREATININE 1.84*   < > 1.81*   < > 1.69*  --  1.77* 1.75* 1.78* 1.64*  LATICACIDVEN 0.7  --  0.9  --   --  0.6  --  0.8  --  0.8   < > = values in this interval not displayed.     Estimated Creatinine Clearance: 63.8 mL/min (A) (by C-G formula based on SCr of 1.64 mg/dL (H)).    Allergies  Allergen Reactions   Bactrim [Sulfamethoxazole-Trimethoprim] Hives    Antimicrobials this admission: Vancomycin 11/17 >>11/18 Cefepime 11/08 >> (11/21) Ceftriaxone 11/7 >> 11/7 Unasyn 11/6 >> 11/7 Oseltamivir 11/3 >> 11/7   Microbiology results: 11/17 BCx: sent 11/14 UA: neg 11/13 Sputum: few PsA - pan sensitive 11/7 Sputum: Staph aureus, Pseudomonas 11/7 Staphylococcus aureus: positive 11/7, 14 MRSA PCR: negative 11/3 Resp panel: Influenza A positive 11/3 MRSA PCR: not detected  Thank you for allowing pharmacy to be a part of this patient's care.  Fredonia Highland, PharmD, BCPS, [facility] Clinical Pharmacist [phone] Please check AMION for all [facility]  numbers 06/29/2021

## 2021-06-29 NOTE — Progress Notes (Signed)
eLink Physician-Brief Progress Note Patient Name: Chris [name]. DOB: Oct 09, 1965 MRN: [id number]   Date of Service  06/29/2021  HPI/Events of Note  Critically ill patient on MV and ECMO. Expired restraints  eICU Interventions  Renew bilateral wrist restraints        Jermayne Sweeney Mechele Collin 06/29/2021, 4:21 AM

## 2021-06-30 ENCOUNTER — Inpatient Hospital Stay (HOSPITAL_COMMUNITY): Payer: Medicare HMO

## 2021-06-30 ENCOUNTER — Encounter (HOSPITAL_COMMUNITY): Admission: EM | Disposition: E | Payer: Self-pay | Source: Home / Self Care | Attending: Pulmonary Disease

## 2021-06-30 DIAGNOSIS — Z515 Encounter for palliative care: Secondary | ICD-10-CM | POA: Diagnosis not present

## 2021-06-30 DIAGNOSIS — K921 Melena: Secondary | ICD-10-CM | POA: Diagnosis not present

## 2021-06-30 DIAGNOSIS — I4891 Unspecified atrial fibrillation: Secondary | ICD-10-CM | POA: Diagnosis not present

## 2021-06-30 DIAGNOSIS — J101 Influenza due to other identified influenza virus with other respiratory manifestations: Secondary | ICD-10-CM | POA: Diagnosis not present

## 2021-06-30 DIAGNOSIS — J80 Acute respiratory distress syndrome: Secondary | ICD-10-CM | POA: Diagnosis not present

## 2021-06-30 DIAGNOSIS — N179 Acute kidney failure, unspecified: Secondary | ICD-10-CM | POA: Diagnosis not present

## 2021-06-30 HISTORY — PX: ESOPHAGOGASTRODUODENOSCOPY: SHX5428

## 2021-06-30 LAB — POCT I-STAT 7, (LYTES, BLD GAS, ICA,H+H)
Acid-Base Excess: 8 mmol/L — ABNORMAL HIGH (ref 0.0–2.0)
Acid-Base Excess: 8 mmol/L — ABNORMAL HIGH (ref 0.0–2.0)
Acid-Base Excess: 7 mmol/L — ABNORMAL HIGH (ref 0.0–2.0)
Acid-Base Excess: 8 mmol/L — ABNORMAL HIGH (ref 0.0–2.0)
Acid-Base Excess: 7 mmol/L — ABNORMAL HIGH (ref 0.0–2.0)
Bicarbonate: 34.8 mmol/L — ABNORMAL HIGH (ref 20.0–28.0)
Bicarbonate: 34.8 mmol/L — ABNORMAL HIGH (ref 20.0–28.0)
Bicarbonate: 33.9 mmol/L — ABNORMAL HIGH (ref 20.0–28.0)
Bicarbonate: 34.4 mmol/L — ABNORMAL HIGH (ref 20.0–28.0)
Bicarbonate: 32.7 mmol/L — ABNORMAL HIGH (ref 20.0–28.0)
Calcium, Ion: 1.2 mmol/L (ref 1.15–1.40)
Calcium, Ion: 1.2 mmol/L (ref 1.15–1.40)
Calcium, Ion: 1.2 mmol/L (ref 1.15–1.40)
Calcium, Ion: 1.22 mmol/L (ref 1.15–1.40)
Calcium, Ion: 1.2 mmol/L (ref 1.15–1.40)
HCT: 28 % — ABNORMAL LOW (ref 39.0–52.0)
HCT: 28 % — ABNORMAL LOW (ref 39.0–52.0)
HCT: 27 % — ABNORMAL LOW (ref 39.0–52.0)
HCT: 26 % — ABNORMAL LOW (ref 39.0–52.0)
HCT: 23 % — ABNORMAL LOW (ref 39.0–52.0)
Hemoglobin: 9.5 g/dL — ABNORMAL LOW (ref 13.0–17.0)
Hemoglobin: 9.5 g/dL — ABNORMAL LOW (ref 13.0–17.0)
Hemoglobin: 9.2 g/dL — ABNORMAL LOW (ref 13.0–17.0)
Hemoglobin: 8.8 g/dL — ABNORMAL LOW (ref 13.0–17.0)
Hemoglobin: 7.8 g/dL — ABNORMAL LOW (ref 13.0–17.0)
O2 Saturation: 96 %
O2 Saturation: 97 %
O2 Saturation: 90 %
O2 Saturation: 95 %
O2 Saturation: 93 %
Patient temperature: 37.3
Patient temperature: 36.9
Patient temperature: 36.9
Patient temperature: 36.1
Patient temperature: 37.5
Potassium: 4.8 mmol/L (ref 3.5–5.1)
Potassium: 4.8 mmol/L (ref 3.5–5.1)
Potassium: 4.6 mmol/L (ref 3.5–5.1)
Potassium: 4.7 mmol/L (ref 3.5–5.1)
Potassium: 4.3 mmol/L (ref 3.5–5.1)
Sodium: 157 mmol/L — ABNORMAL HIGH (ref 135–145)
Sodium: 156 mmol/L — ABNORMAL HIGH (ref 135–145)
Sodium: 157 mmol/L — ABNORMAL HIGH (ref 135–145)
Sodium: 155 mmol/L — ABNORMAL HIGH (ref 135–145)
Sodium: 156 mmol/L — ABNORMAL HIGH (ref 135–145)
TCO2: 37 mmol/L — ABNORMAL HIGH (ref 22–32)
TCO2: 37 mmol/L — ABNORMAL HIGH (ref 22–32)
TCO2: 36 mmol/L — ABNORMAL HIGH (ref 22–32)
TCO2: 36 mmol/L — ABNORMAL HIGH (ref 22–32)
TCO2: 34 mmol/L — ABNORMAL HIGH (ref 22–32)
pCO2 arterial: 65.1 mmHg (ref 32.0–48.0)
pCO2 arterial: 63.7 mmHg — ABNORMAL HIGH (ref 32.0–48.0)
pCO2 arterial: 59.9 mmHg — ABNORMAL HIGH (ref 32.0–48.0)
pCO2 arterial: 59.2 mmHg — ABNORMAL HIGH (ref 32.0–48.0)
pCO2 arterial: 58 mmHg — ABNORMAL HIGH (ref 32.0–48.0)
pH, Arterial: 7.338 — ABNORMAL LOW (ref 7.350–7.450)
pH, Arterial: 7.345 — ABNORMAL LOW (ref 7.350–7.450)
pH, Arterial: 7.361 (ref 7.350–7.450)
pH, Arterial: 7.369 (ref 7.350–7.450)
pH, Arterial: 7.362 (ref 7.350–7.450)
pO2, Arterial: 94 mmHg (ref 83.0–108.0)
pO2, Arterial: 97 mmHg (ref 83.0–108.0)
pO2, Arterial: 62 mmHg — ABNORMAL LOW (ref 83.0–108.0)
pO2, Arterial: 80 mmHg — ABNORMAL LOW (ref 83.0–108.0)
pO2, Arterial: 75 mmHg — ABNORMAL LOW (ref 83.0–108.0)

## 2021-06-30 LAB — BASIC METABOLIC PANEL
Anion gap: 7 (ref 5–15)
Anion gap: 11 (ref 5–15)
BUN: 117 mg/dL — ABNORMAL HIGH (ref 6–20)
BUN: 121 mg/dL — ABNORMAL HIGH (ref 6–20)
CO2: 30 mmol/L (ref 22–32)
CO2: 31 mmol/L (ref 22–32)
Calcium: 7.9 mg/dL — ABNORMAL LOW (ref 8.9–10.3)
Calcium: 8.2 mg/dL — ABNORMAL LOW (ref 8.9–10.3)
Chloride: 117 mmol/L — ABNORMAL HIGH (ref 98–111)
Chloride: 115 mmol/L — ABNORMAL HIGH (ref 98–111)
Creatinine, Ser: 1.73 mg/dL — ABNORMAL HIGH (ref 0.61–1.24)
Creatinine, Ser: 1.91 mg/dL — ABNORMAL HIGH (ref 0.61–1.24)
GFR, Estimated: 46 mL/min — ABNORMAL LOW (ref >60)
GFR, Estimated: 41 mL/min — ABNORMAL LOW (ref >60)
Glucose, Bld: 156 mg/dL — ABNORMAL HIGH (ref 70–99)
Glucose, Bld: 154 mg/dL — ABNORMAL HIGH (ref 70–99)
Potassium: 4.8 mmol/L (ref 3.5–5.1)
Potassium: 4.8 mmol/L (ref 3.5–5.1)
Sodium: 154 mmol/L — ABNORMAL HIGH (ref 135–145)
Sodium: 157 mmol/L — ABNORMAL HIGH (ref 135–145)

## 2021-06-30 LAB — CULTURE, RESPIRATORY W GRAM STAIN

## 2021-06-30 LAB — CBC
HCT: 31.5 % — ABNORMAL LOW (ref 39.0–52.0)
HCT: 29.4 % — ABNORMAL LOW (ref 39.0–52.0)
Hemoglobin: 10.2 g/dL — ABNORMAL LOW (ref 13.0–17.0)
Hemoglobin: 9.3 g/dL — ABNORMAL LOW (ref 13.0–17.0)
MCH: 29.9 pg (ref 26.0–34.0)
MCH: 29.9 pg (ref 26.0–34.0)
MCHC: 32.4 g/dL (ref 30.0–36.0)
MCHC: 31.6 g/dL (ref 30.0–36.0)
MCV: 92.4 fL (ref 80.0–100.0)
MCV: 94.5 fL (ref 80.0–100.0)
Platelets: 81 10*3/uL — ABNORMAL LOW (ref 150–400)
Platelets: 78 10*3/uL — ABNORMAL LOW (ref 150–400)
RBC: 3.41 MIL/uL — ABNORMAL LOW (ref 4.22–5.81)
RBC: 3.11 MIL/uL — ABNORMAL LOW (ref 4.22–5.81)
RDW: 17.8 % — ABNORMAL HIGH (ref 11.5–15.5)
RDW: 18.6 % — ABNORMAL HIGH (ref 11.5–15.5)
WBC: 15.9 10*3/uL — ABNORMAL HIGH (ref 4.0–10.5)
WBC: 14.8 10*3/uL — ABNORMAL HIGH (ref 4.0–10.5)
nRBC: 4.7 % — ABNORMAL HIGH (ref 0.0–0.2)
nRBC: 3.1 % — ABNORMAL HIGH (ref 0.0–0.2)

## 2021-06-30 LAB — GLUCOSE, CAPILLARY
Glucose-Capillary: 133 mg/dL — ABNORMAL HIGH (ref 70–99)
Glucose-Capillary: 134 mg/dL — ABNORMAL HIGH (ref 70–99)
Glucose-Capillary: 136 mg/dL — ABNORMAL HIGH (ref 70–99)
Glucose-Capillary: 136 mg/dL — ABNORMAL HIGH (ref 70–99)
Glucose-Capillary: 136 mg/dL — ABNORMAL HIGH (ref 70–99)
Glucose-Capillary: 138 mg/dL — ABNORMAL HIGH (ref 70–99)
Glucose-Capillary: 140 mg/dL — ABNORMAL HIGH (ref 70–99)
Glucose-Capillary: 141 mg/dL — ABNORMAL HIGH (ref 70–99)
Glucose-Capillary: 143 mg/dL — ABNORMAL HIGH (ref 70–99)
Glucose-Capillary: 143 mg/dL — ABNORMAL HIGH (ref 70–99)
Glucose-Capillary: 144 mg/dL — ABNORMAL HIGH (ref 70–99)
Glucose-Capillary: 145 mg/dL — ABNORMAL HIGH (ref 70–99)
Glucose-Capillary: 147 mg/dL — ABNORMAL HIGH (ref 70–99)
Glucose-Capillary: 148 mg/dL — ABNORMAL HIGH (ref 70–99)
Glucose-Capillary: 152 mg/dL — ABNORMAL HIGH (ref 70–99)
Glucose-Capillary: 161 mg/dL — ABNORMAL HIGH (ref 70–99)
Glucose-Capillary: 210 mg/dL — ABNORMAL HIGH (ref 70–99)
Glucose-Capillary: 213 mg/dL — ABNORMAL HIGH (ref 70–99)
Glucose-Capillary: 218 mg/dL — ABNORMAL HIGH (ref 70–99)
Glucose-Capillary: 221 mg/dL — ABNORMAL HIGH (ref 70–99)
Glucose-Capillary: 226 mg/dL — ABNORMAL HIGH (ref 70–99)

## 2021-06-30 LAB — HEMOGLOBIN AND HEMATOCRIT, BLOOD
HCT: 27.7 % — ABNORMAL LOW (ref 39.0–52.0)
HCT: 30.6 % — ABNORMAL LOW (ref 39.0–52.0)
Hemoglobin: 8.8 g/dL — ABNORMAL LOW (ref 13.0–17.0)
Hemoglobin: 9.8 g/dL — ABNORMAL LOW (ref 13.0–17.0)

## 2021-06-30 LAB — HEPATIC FUNCTION PANEL
ALT: 59 U/L — ABNORMAL HIGH (ref 0–44)
AST: 57 U/L — ABNORMAL HIGH (ref 15–41)
Albumin: <1.5 g/dL — ABNORMAL LOW (ref 3.5–5.0)
Alkaline Phosphatase: 31 U/L — ABNORMAL LOW (ref 38–126)
Bilirubin, Direct: <0.1 mg/dL (ref 0.0–0.2)
Total Bilirubin: 0.5 mg/dL (ref 0.3–1.2)
Total Protein: 5.1 g/dL — ABNORMAL LOW (ref 6.5–8.1)

## 2021-06-30 LAB — PREPARE RBC (CROSSMATCH)

## 2021-06-30 LAB — APTT: aPTT: 35 seconds (ref 24–36)

## 2021-06-30 LAB — FIBRINOGEN: Fibrinogen: 537 mg/dL — ABNORMAL HIGH (ref 210–475)

## 2021-06-30 LAB — PROTIME-INR
INR: 1.4 — ABNORMAL HIGH (ref 0.8–1.2)
Prothrombin Time: 16.7 seconds — ABNORMAL HIGH (ref 11.4–15.2)

## 2021-06-30 LAB — LACTATE DEHYDROGENASE: LDH: 269 U/L — ABNORMAL HIGH (ref 98–192)

## 2021-06-30 LAB — LACTIC ACID, PLASMA: Lactic Acid, Venous: 0.9 mmol/L (ref 0.5–1.9)

## 2021-06-30 SURGERY — EGD (ESOPHAGOGASTRODUODENOSCOPY)

## 2021-06-30 MED ORDER — MIDAZOLAM HCL (PF) 5 MG/ML IJ SOLN
INTRAMUSCULAR | Status: AC
  Filled 2021-06-30: qty 1

## 2021-06-30 MED ORDER — FENTANYL CITRATE (PF) 100 MCG/2ML IJ SOLN
INTRAMUSCULAR | Status: AC
  Filled 2021-06-30: qty 2

## 2021-06-30 MED ORDER — FUROSEMIDE 10 MG/ML IJ SOLN
60.0000 mg | Freq: Three times a day (TID) | INTRAMUSCULAR | Status: DC
Start: 2021-06-30 — End: 2021-07-01
  Administered 2021-06-30: 60 mg via INTRAVENOUS
  Filled 2021-06-30: qty 6

## 2021-06-30 MED ORDER — LIP MEDEX EX OINT
TOPICAL_OINTMENT | CUTANEOUS | Status: DC | PRN
  Filled 2021-06-30: qty 7

## 2021-06-30 MED ORDER — SODIUM CHLORIDE 0.9 % IV SOLN: INTRAVENOUS | Status: DC

## 2021-06-30 MED ORDER — DIPHENHYDRAMINE HCL 50 MG/ML IJ SOLN
INTRAMUSCULAR | Status: AC
  Filled 2021-06-30: qty 1

## 2021-06-30 MED ORDER — CHLORHEXIDINE GLUCONATE CLOTH 2 % EX PADS
6.0000 | MEDICATED_PAD | Freq: Every day | CUTANEOUS | Status: DC
  Administered 2021-07-01 – 2021-07-05 (×5): 6 via TOPICAL

## 2021-06-30 NOTE — Op Note (Signed)
[facility] Patient Name: Chris [name] Procedure Date : 06/14/2021 MRN: [id number] Attending MD: Rachael Fee , MD Date of Birth: 04-04-1966 CSN: [id number] Age: 55 Admit Type: Inpatient Procedure:                Upper GI endoscopy Indications:              Actively bleeding duodenal bulb AVM treated 3 days                            ago by EGD; now with recurrent overt melena, given                            4-6 units PRBC yesterday; has been on ECMO, IV                            blood thinner was stopped yesterday Providers:                Rachael Fee, MD, Nehemiah Settle Person, Michele Mcalpine                            Technician Referring MD:              Medicines:                Monitored Anesthesia Care Complications:            No immediate complications. Estimated blood loss:                            None. Estimated Blood Loss:     Estimated blood loss: none. Procedure:                Pre-Anesthesia Assessment:                           - Prior to the procedure, a History and Physical                            was performed, and patient medications and                            allergies were reviewed. The patient's tolerance of                            previous anesthesia was also reviewed. The risks                            and benefits of the procedure and the sedation                            options and risks were discussed with the patient.                            All questions were answered, and informed consent  was obtained. Prior Anticoagulants: The patient has                            taken anticoagulant medication, last dose was 1 day                            prior to procedure. ASA Grade Assessment: IV - A                            patient with severe systemic disease that is a                            constant threat to life. After reviewing the risks                            and benefits, the  patient was deemed in                            satisfactory condition to undergo the procedure.                           After obtaining informed consent, the endoscope was                            passed under direct vision. Throughout the                            procedure, the patient's blood pressure, pulse, and                            oxygen saturations were monitored continuously. The                            [id number] Olympus endoscope was introduced                            through the mouth, and advanced to the third part                            of duodenum. The upper GI endoscopy was                            accomplished without difficulty. The patient                            tolerated the procedure well. Scope In: Scope Out: Findings:      The esophagus was normal.      Small amount of blood clot in the stomach, old appearing. No fresh red       blood in the UGI tract.      The site of recent AVM ablation (2-3 days ago) was evident, there was no       bleeding present. There is a typical appearing residual superficial       ulcer at  the site that is expected after APC cautery. Impression:               - Normal esophagus.                           - The site of recent AVM ablation (2-3 days ago)                            was evident, there was no bleeding present. There                            is a typical appearing residual superficial ulcer                            at the site that is expected after APC cautery.                           - No fresh red blood or blood clots. There was a                            small amount of old appearing clot in the stomach. Recommendation:           - Follow clinically.                           - If there is further concern for GI bleeding,                            should evaluate the small bowel/colon for ischemic                            damage given his hypotension, ECMO. Procedure Code(s):         --- Professional ---                           [phone], Esophagogastroduodenoscopy, flexible,                            transoral; diagnostic, including collection of                            specimen(s) by brushing or washing, when performed                            (separate procedure) Diagnosis Code(s):        --- Professional ---                           K92.1, Melena (includes Hematochezia) CPT copyright 2019 American Medical Association. All rights reserved. The codes documented in this report are preliminary and upon coder review may  be revised to meet current compliance requirements. Milus Banister, MD 07/04/2021 1:47:04 PM This report has been signed electronically. Number of Addenda: 0

## 2021-06-30 NOTE — Progress Notes (Signed)
   Palliative Medicine Inpatient Follow Up Note  HPI: 55 y.o. male  with past medical history of atrial fibrillation, CHF, HTN, severe COPD, h/o ARDS requiring trach but has since been decannulated, diabetes, HIV admitted on 06/18/2021 with fever, shortness of breath, cough worsening over past 3 days. +Influenza A. Required intubation 06/12/2021 and cannulation for initiation of VV ECMO 07/04/2021.   Palliative care was asked to get involved for Establishing goals of care; ECMO.  Noted past palliative care involvement with previous hospital stay in 2019 when he required trach and CRRT  Today's Discussion (06/29/2021):  *Please note that this is a verbal dictation therefore any spelling or grammatical errors are due to the "Dragon Medical One" system interpretation.  Chart reviewed.  Spoke to nursing staff. Patient has been tolerating sweep trials. Per RN plan for possible decannulation today. (+) maroon stools s/p 2U PRBCs. Sedation increased in early AM d/t agitation. NGT feeds on hold.   No family present at bedside.   I called [place] this morning to offer Palliative support, she was thankful. Questions and concerns answered.   Objective Assessment: Vital Signs Vitals:   06/25/2021 0715 06/16/2021 0730  BP:    Pulse:    Resp: 15 16  Temp:    SpO2: 98% 97%    Intake/Output Summary (Last 24 hours) at 07/01/2021 0756 Last data filed at 06/18/2021 0700 Gross per 24 hour  Intake 6507.6 ml  Output 4940 ml  Net 1567.6 ml    Last Weight  Most recent update: 06/25/2021  2:35 AM    Weight  116 kg (255 lb 11.7 oz)            Gen:  Obese AA M chronically ill appearing HEENT: Dry mucous membranes, (+) coretrack, (+) tracheostomy CV: Irregular rate and rhythm  PULM: Mechanical Ventilator ABD: Soft this morning YKZ:[id number] edema Neuro: On sedation  SUMMARY OF RECOMMENDATIONS   Continue Full Code/Full Scope of Treatment  ECMO as a bridge to recovery  Ongoing incremental PMT  support  Time Spent: 25 Greater than 50% of the time was spent in counseling and coordination of care ______________________________________________________________________________________ Lamarr Lulas [facility] Palliative Medicine Team Team Cell Phone: [phone] Please utilize secure chat with additional questions, if there is no response within 30 minutes please call the above phone number  Palliative Medicine Team providers are available by phone from 7am to 7pm daily and can be reached through the team cell phone.  Should this patient require assistance outside of these hours, please call the patient's attending physician.

## 2021-06-30 NOTE — Progress Notes (Signed)
HE's required 6 units blood over the past 24 hours, still putting out melenic stools.  His IV blood thinner was stopped yesterday.  WE are planning repeat EGD at bedside later today

## 2021-06-30 NOTE — Progress Notes (Signed)
NAME:  Chris [name]., MRN:  [id number], DOB:  02-10-1966, LOS: 54 ADMISSION DATE:  07/01/2021, CONSULTATION DATE:  06/15/2019 REFERRING MD:  Ruthann Cancer, CHIEF COMPLAINT:  hypercapnic respiratory failure   History of Present Illness:   55 year old man with O2 dependent COPD presenting with acute on chronic respiratory failure now on VV ECMO due to refractory hypoxia and hypercapnia.  Pertinent  Medical History   Past Medical History:  Diagnosis Date   Anxiety    Atrial fibrillation (HCC)    CHF (congestive heart failure) (HCC)    COPD (chronic obstructive pulmonary disease) ([place])    Emphysema [[name]]   Depression    Diabetes mellitus without complication ([place])    type 2   Emphysema of lung ([place])    GERD (gastroesophageal reflux disease)    HIV disease ([place])    Hypertension    Oxygen deficiency     Significant Hospital Events: Including procedures, antibiotic start and stop dates in addition to other pertinent events   11/7- VV ECMO started 11/9 - weaning sedation. Transfused 2 units PRBC to keep O2 carrying capacity.  11/9 - remains agitated with wake up  11/10 - tracheostomy tube placed 11/14 - trach exchanged to distal XLT due to cuff leak 11/17  - weaned sweep down to 1.5lpm prior to developing melena 11/17  - EGD bleeding duodenal bulb AVM injected and cauterized. Duodenal bulb ischemia. 11/19 stool became maroon.  No improvement in hemoglobin despite 4 units of blood.  Increasing vasopressor requirements.  Despite this, tolerated sweep trial. 11/20 off bivalirudin, maroon stools have resolved.  Repeat endoscopy shows no active signs of bleeding.    Interim History / Subjective:  Tolerated overnight sweep trial.  Hemoglobin has remained stable since last night off bivalirudin. Repeat endoscopy shows no active bleeding. Objective   Blood pressure (!) 101/53, pulse (!) 102, temperature 98.4 F (36.9 C), temperature source Core, resp. rate 15, height 5\' 9"  (1.753 m),  weight 116 kg, SpO2 100 %.    Vent Mode: PCV FiO2 (%):  [75 %-100 %] 100 % Set Rate:  [15 bmp] 15 bmp PEEP:  [10 [id number] cmH20] 12 cmH20 Pressure Support:  [22 cmH20] 22 cmH20   Intake/Output Summary (Last 24 hours) at 06/23/2021 1233 Last data filed at 06/26/2021 1200 Gross per 24 hour  Intake 5649.35 ml  Output 5240 ml  Net 409.35 ml    Filed Weights   06/28/21 0301 06/29/21 0500 07/02/2021 0230  Weight: 116.2 kg 115.5 kg 116 kg    Examination: General: critically ill appearing man lying in bed trached, sedated, on ecmo HEENT: [place]/AT, eyes anicteric Neck: no bleeding or drainage around trach, Shiley cuffed distal XLT #8 Resp: no wheezing or rhonchi, synchronous with MV now on PSV.  Patient breathing comfortably.  Wants to take large slow breaths consistent with his preintubation pattern. Cardio: S1S2, irreg rhythm, reg rate Neuro: RASS -3, intact cough reflex, PERRL Extremities: moderate dependent edema, no cyanosis GU: yellow urine, foley Derm: warm, dry, no rashes or ecchymoses  Ancillary tests:    CXR reviewed and shows both lung fields clear with cannula in good position.   ABG: mild compensated respiratory acidosis.  Leukocytosis improved Assessment & Plan:   Critically ill  due to acute on chronic hypoxic and hypercapnic respiratory failure from influenza pneumonia on background of severe COPD, prior tracheostomy. On HOT 10 years. Acute COPD exacerbation; severe underlying COPD- ARDS-   Pseudomonas and MSSA HAP Now trach dependent Concern  for undiagnosed OSA Acute metabolic encephalopathy with severe agitation HIV, undetectable VL Distributive and hemorrhagic shock due to sedation Acute blood loss anemia due to acute bleeding duodenal AVM Chronic Afib w/RVR H/o HFpEF GERD Type 2 diabetes with hyperglycemia Hypernatremia from high insensitive losses Deconditioning, acute on chronic  Plan:   -Patient has tolerated initial 18-hour sweep trial with good  results. -No evidence of active bleeding at this time. -Maintain on low-level sweep with no bivalirudin overnight and initiate new abbreviated sweep trial at 4 AM 11/21 with view to proceed to decannulation. -Maintain on full ventilator support until sedation from endoscopy wears off then resume PSV per patient tolerance. -If patient develops problems with circuit overnight would not recommend switching out to a new one but rather proceeding to decannulation.   Best Practice (right click and "Reselect all SmartList Selections" daily)   Diet/type: tubefeeds  DVT prophylaxis: bivalirudin GI prophylaxis: PPI Lines: Central line and Arterial Line Foley:  external catheter Code Status:  full code Last date of multidisciplinary goals of care discussion: continue for family updates   CRITICAL CARE Performed by: [name]  Total critical care time: 40 minutes  Critical care time was exclusive of separately billable procedures and treating other patients.  Critical care was necessary to treat or prevent imminent or life-threatening deterioration.  Critical care was time spent personally by me on the following activities: development of treatment plan with patient and/or surrogate as well as nursing, discussions with consultants, evaluation of patient's response to treatment, examination of patient, obtaining history from patient or surrogate, ordering and performing treatments and interventions, ordering and review of laboratory studies, ordering and review of radiographic studies, pulse oximetry, re-evaluation of patient's condition and participation in multidisciplinary rounds.  [name], MD [facility] ICU Physician [facility] [place] Critical Care  Pager: [phone] Mobile: [phone] After hours: [phone].

## 2021-06-30 NOTE — Progress Notes (Signed)
Remains on vent, VV ECMO on sweep trial. Stable, no complications at cannula sites.  Afib RVR responded to Amio bolus and gtt. BP soft requiring titration of levo gtt. Able to wean down after PRBC.   Received PRBC x 2 for H/H = 8.8/27.2, recheck H/H = 10.2/31.5. Flexiseal continues with bloody/maroon stool (1300 ml out in 24 hr). TF remain off. Bival gtt remains off.   Foley, lasix 60 mg IV between units. UO = 3.6 L/24 hr. BUN/Cr slightly incr = 117/1.73.  Intermittently agitated/restless, precedex gtt, dilaudid gtt/bolus, reassurance. Very agitated this am, incr HR/BP, responded to versed 2 mg IV per prn orders.   Bilat soft limb wrist restraints remain on for pt safety.

## 2021-06-30 NOTE — Progress Notes (Signed)
Patient ID: Chris Round., male   DOB: [birth year], 54 y.o.   MRN: [id number]     Advanced Heart Failure Rounding Note  PCP-Cardiologist: Glenetta Hew, MD   Subjective:    11/7: VV ECMO cannulation 11/10: Tracheostomy 11/14: Lurline Idol exchange 11/17: EGD for UGIB, APC duodenal AVM 11/17  Trach aspirate 11/17 with Pseudomonas and MSSA, 11/13 trach aspirate still with Pseudomonas.  Oseltamivir for influenza completed.  Remains on ECMO and vent at 100%. Did well with sweep trial for over 18 hours.   Off bival. Still with active GI bleeding with ongoing melena. Received 6u RBCs over last 24 hours.   On NE 18 and VP 0.03  ECMO: 3500 rpm Flow 4.60 Pvenous -86 DeltaP 35 Sweep 0 ABG 7.34/64/97/97% PTT - off bival x 24 hours Lactate 0.9 LDH 269  Objective:   Weight Range: 116 kg Body mass index is 37.77 kg/m.   Vital Signs:   Temp:  [98.2 F (36.8 C)-99.3 F (37.4 C)] 98.2 F (36.8 C) (11/20 0400) Pulse Rate:  [97-130] 97 (11/20 0857) Resp:  [10-37] 15 (11/20 0915) BP: (81-123)/(43-53) 101/53 (11/20 0857) SpO2:  [86 %-100 %] 92 % (11/20 0915) Arterial Line BP: (80-163)/(40-65) 101/51 (11/20 0915) FiO2 (%):  [70 %-100 %] 80 % (11/20 0857) Weight:  [256 kg] 116 kg (11/20 0230) Last BM Date: 06/16/2021  Weight change: Filed Weights   06/28/21 0301 06/29/21 0500 07/08/2021 0230  Weight: 116.2 kg 115.5 kg 116 kg    Intake/Output:   Intake/Output Summary (Last 24 hours) at 06/28/2021 1010 Last data filed at 06/26/2021 0900 Gross per 24 hour  Intake 5809.7 ml  Output 4690 ml  Net 1119.7 ml       Physical Exam    General:  On vent through trach. Sedated but will arouse HEENT: normal  Neck: supple. + trach + RIJ ECMO cannula. LIJ TLC  Cor: PMI nondisplaced. Irregular rate & rhythm. No rubs, gallops or murmurs. Lungs: decreased throughout  Abdomen: obese soft, nontender, nondistended. No hepatosplenomegaly. No bruits or masses. Good bowel sounds. Extremities: no  cyanosis, clubbing, rash, 1+  edema Neuro: sedated but will arouse    Telemetry   Atrial fibrillation 80-90s Personally reviewed  Labs    CBC Recent Labs    06/29/21 1411 06/29/21 1724 07/06/2021 0351 07/02/2021 0357 06/15/2021 0807 07/09/2021 1003  WBC 14.7*  --  15.9*  --   --   --   HGB 7.6*   < > 10.2*   < > 9.5* 9.2*  HCT 24.3*   < > 31.5*   < > 28.0* 27.0*  MCV 94.9  --  92.4  --   --   --   PLT 104*  --  81*  --   --   --    < > = values in this interval not displayed.    Basic Metabolic Panel Recent Labs    06/29/21 1411 06/29/21 1726 06/19/2021 0351 07/02/2021 0357 06/26/2021 0807 07/01/2021 1003  NA 156*   < > 154*   < > 156* 157*  K 4.5   < > 4.8   < > 4.8 4.6  CL 117*  --  117*  --   --   --   CO2 34*  --  30  --   --   --   GLUCOSE 177*  --  156*  --   --   --   BUN 108*  --  117*  --   --   --  CREATININE 1.52*  --  1.73*  --   --   --   CALCIUM 7.8*  --  7.9*  --   --   --    < > = values in this interval not displayed.    Liver Function Tests Recent Labs    06/29/21 0337 07/04/2021 0351  AST 87* 57*  ALT 80* 59*  ALKPHOS 35* 31*  BILITOT 0.6 0.5  PROT 5.6* 5.1*  ALBUMIN 1.7* <1.5*    No results for input(s): LIPASE, AMYLASE in the last 72 hours. Cardiac Enzymes No results for input(s): CKTOTAL, CKMB, CKMBINDEX, TROPONINI in the last 72 hours.  BNP: BNP (last 3 results) Recent Labs    10/29/20 1214  BNP 102.9*     ProBNP (last 3 results) No results for input(s): PROBNP in the last 8760 hours.   D-Dimer No results for input(s): DDIMER in the last 72 hours. Hemoglobin A1C No results for input(s): HGBA1C in the last 72 hours. Fasting Lipid Panel No results for input(s): CHOL, HDL, LDLCALC, TRIG, CHOLHDL, LDLDIRECT in the last 72 hours.  Thyroid Function Tests No results for input(s): TSH, T4TOTAL, T3FREE, THYROIDAB in the last 72 hours.  Invalid input(s): FREET3  Other results:   Imaging    DG CHEST PORT 1 VIEW  Result Date:  07/07/2021 CLINICAL DATA:  Respiratory failure. EXAM: PORTABLE CHEST 1 VIEW COMPARISON:  06/29/2021 FINDINGS: 0507 hours. Endotracheal tube tip is 7.7 cm above the base of the carina. A feeding tube passes into the stomach although the distal tip position is not included on the film. ECMO cannula again noted. Bibasilar atelectasis again noted with interval improvement in aeration at the left base. No substantial pleural effusion. Telemetry leads overlie the chest. IMPRESSION: 1. Interval improvement in aeration at the left base. 2. Otherwise no substantial change. Electronically Signed   By: Misty Stanley M.D.   On: 07/01/2021 08:30     Medications:     Scheduled Medications:  arformoterol  15 mcg Nebulization BID   budesonide (PULMICORT) nebulizer solution  0.25 mg Nebulization BID   chlorhexidine gluconate (MEDLINE KIT)  15 mL Mouth Rinse BID   Chlorhexidine Gluconate Cloth  6 each Topical Daily   clonazePAM  1.5 mg Per Tube TID   emtricitabine-tenofovir AF  1 tablet Per Tube Daily   And   dolutegravir  50 mg Per Tube Daily   feeding supplement (PROSource TF)  45 mL Per Tube TID   free water  200 mL Per Tube Q4H   furosemide  60 mg Intravenous BID   ipratropium-albuterol  3 mL Nebulization Q4H   mouth rinse  15 mL Mouth Rinse 10 times per day   oxyCODONE  15 mg Per Tube Q6H   pantoprazole (PROTONIX) IV  40 mg Intravenous Q12H   polyethylene glycol  17 g Per Tube BID   QUEtiapine  100 mg Per Tube BID   revefenacin  175 mcg Nebulization Daily   rosuvastatin  20 mg Per Tube Daily   senna-docusate  1 tablet Per Tube BID   sodium chloride flush  10-40 mL Intracatheter Q12H   sodium chloride flush  3 mL Intravenous Q12H   valproic acid  250 mg Per Tube TID    Infusions:  sodium chloride     sodium chloride 10 mL/hr at 07/01/2021 1502   sodium chloride     sodium chloride Stopped (07/10/2021 1341)   sodium chloride     sodium chloride  sodium chloride Stopped (06/15/2021 0913)    amiodarone 30 mg/hr (07/07/2021 0626)   ceFEPime (MAXIPIME) IV Stopped (06/14/2021 8921)   dexmedetomidine (PRECEDEX) IV infusion 1.5 mcg/kg/hr (07/02/2021 0900)   feeding supplement (PIVOT 1.5 CAL) Stopped (06/29/21 1600)   HYDROmorphone 2 mg/hr (07/08/2021 0900)   insulin 3.4 Units/hr (06/26/2021 0926)   norepinephrine (LEVOPHED) Adult infusion 18 mcg/min (06/29/2021 0900)   vasopressin 0.03 Units/min (06/29/2021 0900)    PRN Medications: sodium chloride, Place/Maintain arterial line **AND** sodium chloride, acetaminophen **OR** acetaminophen, albuterol, bisacodyl, dextrose, docusate, HYDROmorphone, midazolam, ondansetron **OR** ondansetron (ZOFRAN) IV, sodium chloride flush, sodium chloride flush   Assessment/Plan   1. Acute hypoxemic and hypercarbic respiratory failure: In setting of COPD on home oxygen at baseline.  He has severe influenza infection.  Refractory hypoxemia and hypercarbia with maximal management.  He has underlying significant lung disease but has reasonable quality of life and had ARDS in the past requiring tracheostomy but was able to wean off.  RESP score was acceptable.  VV ECMO cannulation on 11/7.  Pseudomonas and MSSA in trach aspirate from 11/7.  Tracheostomy 11/10.  Has been on sweep trial for the past 18 hours. This morning, lactate is 0.9 ABG 7.34/64/97/97%  Good UOP with IV Lasix, but net positive due to RBCs.  BUN now markedly elevated (117), suspect in part due to UGIB. Need negative I/Os with significant weight gain. Weight still up about 12 pounds from baseline - Did well with sweep trial overnight and keeping Hgb > 9.0. Likely this as good as we can get him and ready for decannulation but situation complicated by ongoing UGIB - Have d/w GI and CCM will plan repeat EGD today prior to ECMO decannulation - Off bival  - Continue Lasix 60 mg IV q12h today. - Antibiotic coverage with cefepime - Solumedrol IV  - Anticipate prolonged course.  2. Atrial fibrillation: He has  been in atrial fibrillation on all ECGs since 2021, suspect chronic at this point. On Eliquis at home. Soft BP has limited metoprolol. Rate controlled on current regimen.  - Holding bival for now with active GIB bleed  - Continue po amiodarone.    - Consider cardioversion in future.  3.  AKI: Creatinine peaked at 2/37. Now stable at 1.6-1.7 - Lasix 60 mg IV every 12 hrs today.  - follow BMET 4. Type 2 DM: Still on insulin gtt.  5. Influenza: Severe.  Completed oseltamivir.  6. COPD: Still smokes 1-2 cigs/day.  Intermittent home oxygen.  Followed by pulmonary, has been stable at home for years but baseline DLCO was very low.  7. HIV: Last viral load was undetectable.  On treatment.  8. Shock: Septic + hemorrhagic.  Echo showed EF 55-60% with normal RV. This morning on NE 18, vasopressin 0.03.  Improved with resolution of UGIB.  - Continue to wean pressors as able.  9. FEN: Tube feeds.  10. 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 Hs gotten 6u RBCs overnight. Hgb 9.2 this morning.  - BID PPI - D/w GI plan repeat EGD today - Keep hgb > 9.0 to facilitate oxygenation - Consider somatostatin if felt to be AVM in nature 11. Hypernatremia: Na 154 today. - Continue current free water boluses.    12. Severe proten=in/calorie malnutrition - albumin < 1.5 - resume TFs when able  CRITICAL CARE Performed by: Glori Bickers  Total critical care time: 50 minutes  Critical care time was exclusive of separately billable procedures and treating other patients.  Critical care was necessary to treat or prevent imminent or life-threatening deterioration.  Critical care was time spent personally by me on the following activities: development of treatment plan with patient and/or surrogate as well as nursing, discussions with consultants, evaluation of patient's response to treatment, examination of patient, obtaining history from patient or surrogate, ordering and performing treatments and  interventions, ordering and review of laboratory studies, ordering and review of radiographic studies, pulse oximetry and re-evaluation of patient's condition.     Length of Stay: Armstrong, MD  06/22/2021, 10:10 AM  Advanced Heart Failure Team Pager [phone] (M-F; 7a - 5p)  Please contact [facility] for night-coverage after hours (5p -7a ) and weekends on [email or web address]

## 2021-06-30 NOTE — Progress Notes (Signed)
Patient ID: Chris Rivers., male   DOB: Dec 20, 1965, 55 y.o.   MRN: [id number]  Extracorporeal support note     ECLS support day: 13 Indication: Severe COPD with influenza A   Configuration: VV ECMO, Crescent catheter right IJ   Drainage cannula: Right IJ Crescent Return cannula: Right IJ Crescent   Pump speed: 3500 Pump flow: 4.6 Pump used: Cardiohelp   Sweep gas: 0.0 (on sweep trial x 18 hours)   Circuit check: Small area of thrombus 3 o'clock  (unchanged). Increasing amount of fibrin strands Anticoagulant: Off bivalirudin x 24 hours due to active GI bleeding Anticoagulation target: PTT 50-70 (decreased post-GI bleed)   Changes in support: Did well with sweep trial. Plan decannulation once GI bleed more stable.    Anticipated goals/duration of support: wean to decannulation  Chris Meres MD 06/17/2021 10:21 AM

## 2021-06-30 NOTE — Progress Notes (Signed)
ANTICOAGULATION CONSULT NOTE   Pharmacy Consult for Bivalirudin Indication:  ECMO and atrial fibrillation  Allergies  Allergen Reactions   Bactrim [Sulfamethoxazole-Trimethoprim] Hives    Patient Measurements: Height: 5\' 9"  (175.3 cm) Weight: 116 kg (255 lb 11.7 oz) IBW/kg (Calculated) : 70.7  Vital Signs: Temp: 98.2 F (36.8 C) (11/20 0400) Temp Source: Core (Comment) (11/20 0400) BP: 101/53 (11/20 0857) Pulse Rate: 97 (11/20 0857)  Labs: Recent Labs    06/28/21 0350 06/28/21 0358 06/29/21 0337 06/29/21 0556 06/29/21 1411 06/29/21 1724 07/03/2021 0351 06/18/2021 0357 06/19/2021 0807  HGB 9.1*   < > 8.1*   < > 7.6*   < > 10.2* 9.5* 9.5*  HCT 27.9*   < > 26.3*   < > 24.3*   < > 31.5* 28.0* 28.0*  PLT 131*   < > 125*  --  104*  --  81*  --   --   APTT 55*   < > 51*  --  60*  --  35  --   --   LABPROT 21.6*  --  21.6*  --   --   --  16.7*  --   --   INR 1.9*  --  1.9*  --   --   --  1.4*  --   --   CREATININE 1.75*   < > 1.64*  --  1.52*  --  1.73*  --   --    < > = values in this interval not displayed.     Estimated Creatinine Clearance: 60.6 mL/min (A) (by C-G formula based on SCr of 1.73 mg/dL (H)).   Medical History: Past Medical History:  Diagnosis Date   Anxiety    Atrial fibrillation (HCC)    CHF (congestive heart failure) (HCC)    COPD (chronic obstructive pulmonary disease) (HCC)    Emphysema [[name]]   Depression    Diabetes mellitus without complication (HCC)    type 2   Emphysema of lung (HCC)    GERD (gastroesophageal reflux disease)    HIV disease (HCC)    Hypertension    Oxygen deficiency     Assessment: 55 Y/O male on chronic apixaban for atrial fibrillation. Previous placed on heparin in anticipation of line placement and possible procedures. Patient cannulated and ECMO initiated 11/7; received a heparin bolus at cannulation, then switched to bivalrudin infusion. Pharmacy to dose bivalirudin.  Bivalirudin stopped overnight 11/17 for  bleeding and occult + stool.  Colonoscopy noted bleeding AVM which was clipped, and bivalirudin resumed after procedure with slightly lower goal.  Bivalirudin stopped again 11/19 with ongoing bloody stool. GI planning EGD today, bivalirudin will remain on hold for now. Pt possibly to be decanulated soon.  Goal of Therapy:  aPTT 50-70 secs Monitor platelets by anticoagulation protocol: Yes   Plan:  Continue to hold bivalirudin with bleeding    12/19, PharmD, BCPS, [facility] Clinical Pharmacist [phone] Please check AMION for all [facility] Pharmacy numbers 06/24/2021

## 2021-07-01 ENCOUNTER — Inpatient Hospital Stay (HOSPITAL_COMMUNITY): Payer: Medicare HMO

## 2021-07-01 ENCOUNTER — Other Ambulatory Visit: Payer: Self-pay | Admitting: Nurse Practitioner

## 2021-07-01 ENCOUNTER — Encounter (HOSPITAL_COMMUNITY): Payer: Self-pay | Admitting: Gastroenterology

## 2021-07-01 DIAGNOSIS — N179 Acute kidney failure, unspecified: Secondary | ICD-10-CM | POA: Diagnosis not present

## 2021-07-01 DIAGNOSIS — I4891 Unspecified atrial fibrillation: Secondary | ICD-10-CM | POA: Diagnosis not present

## 2021-07-01 DIAGNOSIS — K921 Melena: Secondary | ICD-10-CM

## 2021-07-01 DIAGNOSIS — N521 Erectile dysfunction due to diseases classified elsewhere: Secondary | ICD-10-CM

## 2021-07-01 DIAGNOSIS — J80 Acute respiratory distress syndrome: Secondary | ICD-10-CM | POA: Diagnosis not present

## 2021-07-01 DIAGNOSIS — J9601 Acute respiratory failure with hypoxia: Secondary | ICD-10-CM | POA: Diagnosis not present

## 2021-07-01 DIAGNOSIS — D500 Iron deficiency anemia secondary to blood loss (chronic): Secondary | ICD-10-CM

## 2021-07-01 DIAGNOSIS — J101 Influenza due to other identified influenza virus with other respiratory manifestations: Secondary | ICD-10-CM | POA: Diagnosis not present

## 2021-07-01 LAB — BASIC METABOLIC PANEL
Anion gap: 6 (ref 5–15)
Anion gap: 7 (ref 5–15)
Anion gap: 8 (ref 5–15)
BUN: 126 mg/dL — ABNORMAL HIGH (ref 6–20)
BUN: 138 mg/dL — ABNORMAL HIGH (ref 6–20)
BUN: 139 mg/dL — ABNORMAL HIGH (ref 6–20)
CO2: 30 mmol/L (ref 22–32)
CO2: 30 mmol/L (ref 22–32)
CO2: 30 mmol/L (ref 22–32)
Calcium: 7.8 mg/dL — ABNORMAL LOW (ref 8.9–10.3)
Calcium: 7.6 mg/dL — ABNORMAL LOW (ref 8.9–10.3)
Calcium: 7.7 mg/dL — ABNORMAL LOW (ref 8.9–10.3)
Chloride: 119 mmol/L — ABNORMAL HIGH (ref 98–111)
Chloride: 117 mmol/L — ABNORMAL HIGH (ref 98–111)
Chloride: 116 mmol/L — ABNORMAL HIGH (ref 98–111)
Creatinine, Ser: 1.97 mg/dL — ABNORMAL HIGH (ref 0.61–1.24)
Creatinine, Ser: 2.01 mg/dL — ABNORMAL HIGH (ref 0.61–1.24)
Creatinine, Ser: 2.02 mg/dL — ABNORMAL HIGH (ref 0.61–1.24)
GFR, Estimated: 39 mL/min — ABNORMAL LOW (ref >60)
GFR, Estimated: 38 mL/min — ABNORMAL LOW (ref >60)
GFR, Estimated: 38 mL/min — ABNORMAL LOW (ref >60)
Glucose, Bld: 176 mg/dL — ABNORMAL HIGH (ref 70–99)
Glucose, Bld: 191 mg/dL — ABNORMAL HIGH (ref 70–99)
Glucose, Bld: 211 mg/dL — ABNORMAL HIGH (ref 70–99)
Potassium: 4.1 mmol/L (ref 3.5–5.1)
Potassium: 4.4 mmol/L (ref 3.5–5.1)
Potassium: 4.5 mmol/L (ref 3.5–5.1)
Sodium: 155 mmol/L — ABNORMAL HIGH (ref 135–145)
Sodium: 154 mmol/L — ABNORMAL HIGH (ref 135–145)
Sodium: 154 mmol/L — ABNORMAL HIGH (ref 135–145)

## 2021-07-01 LAB — CBC
HCT: 25.1 % — ABNORMAL LOW (ref 39.0–52.0)
HCT: 27 % — ABNORMAL LOW (ref 39.0–52.0)
HCT: 26.4 % — ABNORMAL LOW (ref 39.0–52.0)
Hemoglobin: 7.8 g/dL — ABNORMAL LOW (ref 13.0–17.0)
Hemoglobin: 8.6 g/dL — ABNORMAL LOW (ref 13.0–17.0)
Hemoglobin: 8.3 g/dL — ABNORMAL LOW (ref 13.0–17.0)
MCH: 29.9 pg (ref 26.0–34.0)
MCH: 30.1 pg (ref 26.0–34.0)
MCH: 29.9 pg (ref 26.0–34.0)
MCHC: 31.1 g/dL (ref 30.0–36.0)
MCHC: 31.9 g/dL (ref 30.0–36.0)
MCHC: 31.4 g/dL (ref 30.0–36.0)
MCV: 96.2 fL (ref 80.0–100.0)
MCV: 94.4 fL (ref 80.0–100.0)
MCV: 95 fL (ref 80.0–100.0)
Platelets: 65 10*3/uL — ABNORMAL LOW (ref 150–400)
Platelets: 72 10*3/uL — ABNORMAL LOW (ref 150–400)
Platelets: 81 10*3/uL — ABNORMAL LOW (ref 150–400)
RBC: 2.61 MIL/uL — ABNORMAL LOW (ref 4.22–5.81)
RBC: 2.86 MIL/uL — ABNORMAL LOW (ref 4.22–5.81)
RBC: 2.78 MIL/uL — ABNORMAL LOW (ref 4.22–5.81)
RDW: 18.4 % — ABNORMAL HIGH (ref 11.5–15.5)
RDW: 18.6 % — ABNORMAL HIGH (ref 11.5–15.5)
RDW: 18.5 % — ABNORMAL HIGH (ref 11.5–15.5)
WBC: 10.6 10*3/uL — ABNORMAL HIGH (ref 4.0–10.5)
WBC: 9.8 10*3/uL (ref 4.0–10.5)
WBC: 10.8 10*3/uL — ABNORMAL HIGH (ref 4.0–10.5)
nRBC: 2.8 % — ABNORMAL HIGH (ref 0.0–0.2)
nRBC: 3.2 % — ABNORMAL HIGH (ref 0.0–0.2)
nRBC: 2.4 % — ABNORMAL HIGH (ref 0.0–0.2)

## 2021-07-01 LAB — TYPE AND SCREEN
ABO/RH(D): A POS
Antibody Screen: NEGATIVE
Unit division: 0
Unit division: 0
Unit division: 0
Unit division: 0
Unit division: 0
Unit division: 0
Unit division: 0
Unit division: 0
Unit division: 0
Unit division: 0

## 2021-07-01 LAB — BPAM RBC
Blood Product Expiration Date: 202211242359
Blood Product Expiration Date: 202211272359
Blood Product Expiration Date: 202212022359
Blood Product Expiration Date: 202212072359
Blood Product Expiration Date: 202212072359
Blood Product Expiration Date: 202212082359
Blood Product Expiration Date: 202212092359
Blood Product Expiration Date: 202212132359
Blood Product Expiration Date: 202212132359
Blood Product Expiration Date: 202212132359
ISSUE DATE / TIME: 202211170538
ISSUE DATE / TIME: 202211170813
ISSUE DATE / TIME: 202211171652
ISSUE DATE / TIME: 202211171909
ISSUE DATE / TIME: 202211190754
ISSUE DATE / TIME: 202211191150
ISSUE DATE / TIME: 202211191558
ISSUE DATE / TIME: 202211191634
ISSUE DATE / TIME: 202211200034
ISSUE DATE / TIME: 202211200034
Unit Type and Rh: 6200
Unit Type and Rh: 6200
Unit Type and Rh: 6200
Unit Type and Rh: 6200
Unit Type and Rh: 6200
Unit Type and Rh: 6200
Unit Type and Rh: 6200
Unit Type and Rh: 6200
Unit Type and Rh: 6200
Unit Type and Rh: 6200

## 2021-07-01 LAB — POCT I-STAT 7, (LYTES, BLD GAS, ICA,H+H)
Acid-Base Excess: 7 mmol/L — ABNORMAL HIGH (ref 0.0–2.0)
Acid-Base Excess: 7 mmol/L — ABNORMAL HIGH (ref 0.0–2.0)
Acid-Base Excess: 7 mmol/L — ABNORMAL HIGH (ref 0.0–2.0)
Acid-Base Excess: 7 mmol/L — ABNORMAL HIGH (ref 0.0–2.0)
Acid-Base Excess: 6 mmol/L — ABNORMAL HIGH (ref 0.0–2.0)
Acid-Base Excess: 5 mmol/L — ABNORMAL HIGH (ref 0.0–2.0)
Acid-Base Excess: 5 mmol/L — ABNORMAL HIGH (ref 0.0–2.0)
Bicarbonate: 33.5 mmol/L — ABNORMAL HIGH (ref 20.0–28.0)
Bicarbonate: 32.9 mmol/L — ABNORMAL HIGH (ref 20.0–28.0)
Bicarbonate: 33.8 mmol/L — ABNORMAL HIGH (ref 20.0–28.0)
Bicarbonate: 32.7 mmol/L — ABNORMAL HIGH (ref 20.0–28.0)
Bicarbonate: 32.4 mmol/L — ABNORMAL HIGH (ref 20.0–28.0)
Bicarbonate: 31.4 mmol/L — ABNORMAL HIGH (ref 20.0–28.0)
Bicarbonate: 30.8 mmol/L — ABNORMAL HIGH (ref 20.0–28.0)
Calcium, Ion: 1.2 mmol/L (ref 1.15–1.40)
Calcium, Ion: 1.2 mmol/L (ref 1.15–1.40)
Calcium, Ion: 1.17 mmol/L (ref 1.15–1.40)
Calcium, Ion: 1.17 mmol/L (ref 1.15–1.40)
Calcium, Ion: 1.18 mmol/L (ref 1.15–1.40)
Calcium, Ion: 1.15 mmol/L (ref 1.15–1.40)
Calcium, Ion: 1.17 mmol/L (ref 1.15–1.40)
HCT: 21 % — ABNORMAL LOW (ref 39.0–52.0)
HCT: 20 % — ABNORMAL LOW (ref 39.0–52.0)
HCT: 22 % — ABNORMAL LOW (ref 39.0–52.0)
HCT: 22 % — ABNORMAL LOW (ref 39.0–52.0)
HCT: 22 % — ABNORMAL LOW (ref 39.0–52.0)
HCT: 24 % — ABNORMAL LOW (ref 39.0–52.0)
HCT: 27 % — ABNORMAL LOW (ref 39.0–52.0)
Hemoglobin: 7.1 g/dL — ABNORMAL LOW (ref 13.0–17.0)
Hemoglobin: 6.8 g/dL — CL (ref 13.0–17.0)
Hemoglobin: 7.5 g/dL — ABNORMAL LOW (ref 13.0–17.0)
Hemoglobin: 7.5 g/dL — ABNORMAL LOW (ref 13.0–17.0)
Hemoglobin: 7.5 g/dL — ABNORMAL LOW (ref 13.0–17.0)
Hemoglobin: 8.2 g/dL — ABNORMAL LOW (ref 13.0–17.0)
Hemoglobin: 9.2 g/dL — ABNORMAL LOW (ref 13.0–17.0)
O2 Saturation: 90 %
O2 Saturation: 88 %
O2 Saturation: 89 %
O2 Saturation: 97 %
O2 Saturation: 88 %
O2 Saturation: 87 %
O2 Saturation: 85 %
Patient temperature: 37.4
Patient temperature: 37.5
Patient temperature: 37.5
Patient temperature: 36.9
Patient temperature: 36.9
Patient temperature: 98.3
Patient temperature: 98
Potassium: 4 mmol/L (ref 3.5–5.1)
Potassium: 4.1 mmol/L (ref 3.5–5.1)
Potassium: 4.5 mmol/L (ref 3.5–5.1)
Potassium: 4.5 mmol/L (ref 3.5–5.1)
Potassium: 4.4 mmol/L (ref 3.5–5.1)
Potassium: 4.4 mmol/L (ref 3.5–5.1)
Potassium: 4.3 mmol/L (ref 3.5–5.1)
Sodium: 157 mmol/L — ABNORMAL HIGH (ref 135–145)
Sodium: 157 mmol/L — ABNORMAL HIGH (ref 135–145)
Sodium: 157 mmol/L — ABNORMAL HIGH (ref 135–145)
Sodium: 156 mmol/L — ABNORMAL HIGH (ref 135–145)
Sodium: 156 mmol/L — ABNORMAL HIGH (ref 135–145)
Sodium: 157 mmol/L — ABNORMAL HIGH (ref 135–145)
Sodium: 156 mmol/L — ABNORMAL HIGH (ref 135–145)
TCO2: 35 mmol/L — ABNORMAL HIGH (ref 22–32)
TCO2: 35 mmol/L — ABNORMAL HIGH (ref 22–32)
TCO2: 36 mmol/L — ABNORMAL HIGH (ref 22–32)
TCO2: 34 mmol/L — ABNORMAL HIGH (ref 22–32)
TCO2: 34 mmol/L — ABNORMAL HIGH (ref 22–32)
TCO2: 33 mmol/L — ABNORMAL HIGH (ref 22–32)
TCO2: 32 mmol/L (ref 22–32)
pCO2 arterial: 58.7 mmHg — ABNORMAL HIGH (ref 32.0–48.0)
pCO2 arterial: 59.4 mmHg — ABNORMAL HIGH (ref 32.0–48.0)
pCO2 arterial: 63 mmHg — ABNORMAL HIGH (ref 32.0–48.0)
pCO2 arterial: 56.9 mmHg — ABNORMAL HIGH (ref 32.0–48.0)
pCO2 arterial: 58.1 mmHg — ABNORMAL HIGH (ref 32.0–48.0)
pCO2 arterial: 54.4 mmHg — ABNORMAL HIGH (ref 32.0–48.0)
pCO2 arterial: 53 mmHg — ABNORMAL HIGH (ref 32.0–48.0)
pH, Arterial: 7.366 (ref 7.350–7.450)
pH, Arterial: 7.354 (ref 7.350–7.450)
pH, Arterial: 7.34 — ABNORMAL LOW (ref 7.350–7.450)
pH, Arterial: 7.367 (ref 7.350–7.450)
pH, Arterial: 7.353 (ref 7.350–7.450)
pH, Arterial: 7.369 (ref 7.350–7.450)
pH, Arterial: 7.37 (ref 7.350–7.450)
pO2, Arterial: 63 mmHg — ABNORMAL LOW (ref 83.0–108.0)
pO2, Arterial: 60 mmHg — ABNORMAL LOW (ref 83.0–108.0)
pO2, Arterial: 63 mmHg — ABNORMAL LOW (ref 83.0–108.0)
pO2, Arterial: 102 mmHg (ref 83.0–108.0)
pO2, Arterial: 59 mmHg — ABNORMAL LOW (ref 83.0–108.0)
pO2, Arterial: 56 mmHg — ABNORMAL LOW (ref 83.0–108.0)
pO2, Arterial: 52 mmHg — ABNORMAL LOW (ref 83.0–108.0)

## 2021-07-01 LAB — GLUCOSE, CAPILLARY
Glucose-Capillary: 124 mg/dL — ABNORMAL HIGH (ref 70–99)
Glucose-Capillary: 125 mg/dL — ABNORMAL HIGH (ref 70–99)
Glucose-Capillary: 139 mg/dL — ABNORMAL HIGH (ref 70–99)
Glucose-Capillary: 144 mg/dL — ABNORMAL HIGH (ref 70–99)
Glucose-Capillary: 155 mg/dL — ABNORMAL HIGH (ref 70–99)
Glucose-Capillary: 158 mg/dL — ABNORMAL HIGH (ref 70–99)
Glucose-Capillary: 161 mg/dL — ABNORMAL HIGH (ref 70–99)
Glucose-Capillary: 162 mg/dL — ABNORMAL HIGH (ref 70–99)
Glucose-Capillary: 162 mg/dL — ABNORMAL HIGH (ref 70–99)
Glucose-Capillary: 165 mg/dL — ABNORMAL HIGH (ref 70–99)
Glucose-Capillary: 165 mg/dL — ABNORMAL HIGH (ref 70–99)
Glucose-Capillary: 167 mg/dL — ABNORMAL HIGH (ref 70–99)
Glucose-Capillary: 171 mg/dL — ABNORMAL HIGH (ref 70–99)
Glucose-Capillary: 173 mg/dL — ABNORMAL HIGH (ref 70–99)
Glucose-Capillary: 175 mg/dL — ABNORMAL HIGH (ref 70–99)
Glucose-Capillary: 186 mg/dL — ABNORMAL HIGH (ref 70–99)
Glucose-Capillary: 192 mg/dL — ABNORMAL HIGH (ref 70–99)
Glucose-Capillary: 199 mg/dL — ABNORMAL HIGH (ref 70–99)
Glucose-Capillary: 209 mg/dL — ABNORMAL HIGH (ref 70–99)

## 2021-07-01 LAB — HEPATIC FUNCTION PANEL
ALT: 45 U/L — ABNORMAL HIGH (ref 0–44)
AST: 42 U/L — ABNORMAL HIGH (ref 15–41)
Albumin: <1.5 g/dL — ABNORMAL LOW (ref 3.5–5.0)
Alkaline Phosphatase: 29 U/L — ABNORMAL LOW (ref 38–126)
Bilirubin, Direct: <0.1 mg/dL (ref 0.0–0.2)
Total Bilirubin: 0.5 mg/dL (ref 0.3–1.2)
Total Protein: 4.8 g/dL — ABNORMAL LOW (ref 6.5–8.1)

## 2021-07-01 LAB — PREPARE RBC (CROSSMATCH)

## 2021-07-01 LAB — LACTATE DEHYDROGENASE: LDH: 234 U/L — ABNORMAL HIGH (ref 98–192)

## 2021-07-01 LAB — PROTIME-INR
INR: 1.4 — ABNORMAL HIGH (ref 0.8–1.2)
Prothrombin Time: 16.7 seconds — ABNORMAL HIGH (ref 11.4–15.2)

## 2021-07-01 LAB — FIBRINOGEN: Fibrinogen: 335 mg/dL (ref 210–475)

## 2021-07-01 MED ORDER — JUVEN PO PACK
1.0000 | PACK | Freq: Two times a day (BID) | ORAL | Status: DC
  Administered 2021-07-01 – 2021-07-05 (×8): 1
  Filled 2021-07-01 (×9): qty 1

## 2021-07-01 MED ORDER — COLLAGENASE 250 UNIT/GM EX OINT
TOPICAL_OINTMENT | Freq: Every day | CUTANEOUS | Status: DC
  Administered 2021-07-02 – 2021-07-04 (×2): 1 via TOPICAL
  Filled 2021-07-01: qty 30

## 2021-07-01 MED ORDER — FUROSEMIDE 10 MG/ML IJ SOLN
80.0000 mg | Freq: Three times a day (TID) | INTRAMUSCULAR | Status: DC
  Administered 2021-07-01 – 2021-07-05 (×13): 80 mg via INTRAVENOUS
  Filled 2021-07-01 (×13): qty 8

## 2021-07-01 MED ORDER — SODIUM CHLORIDE 0.9% IV SOLUTION: Freq: Once | INTRAVENOUS | Status: AC

## 2021-07-01 MED ORDER — FREE WATER
300.0000 mL | Status: DC
  Administered 2021-07-01 – 2021-07-04 (×17): 300 mL
  Administered 2021-07-04 (×2): 100 mL
  Administered 2021-07-04 – 2021-07-05 (×4): 300 mL
  Administered 2021-07-05: 100 mL

## 2021-07-01 NOTE — Progress Notes (Signed)
Daily Rounding Note  07/01/2021, 2:44 PM  LOS: 18 days   SUBJECTIVE:   Chief complaint:    Bleed with melena.  Continues to pass liquid very dark brown/black, melenic smelling stools.  Required 2 more PRBCs this morning when Hb dropped to 6.8.  Remains intubated on vent. Amiodarone, insulin, Precedex, Dilaudid, vasopressin, Levophed drips in place.  Tolerating tube feeds.  OBJECTIVE:         Vital signs in last 24 hours:    Temp:  [98.1 F (36.7 C)-99.5 F (37.5 C)] 98.3 F (36.8 C) (11/21 1200) Pulse Rate:  [101-127] 101 (11/21 1200) Resp:  [10-25] 22 (11/21 1300) BP: (98-111)/(36-45) 98/45 (11/21 1200) SpO2:  [78 %-100 %] 96 % (11/21 1300) Arterial Line BP: (86-156)/(36-73) 121/49 (11/21 1300) FiO2 (%):  [50 %-100 %] 70 % (11/21 1200) Weight:  [118.2 kg] 118.2 kg (11/21 0630) Last BM Date: 07/01/21 Filed Weights   06/29/21 0500 07/02/2021 0230 07/01/21 0630  Weight: 115.5 kg 116 kg 118.2 kg   General: Odor of melena pervades the patient's room. Heart: RRR Chest: Regular breathing pattern on vent. Abdomen: Obese, distended, moderately tense, nontender.  Bowel sounds normal but hypoactive.  Flexi-Seal with liquid black-brown stool. Neuro/Psych: Sedated, unresponsive to exam.  Intake/Output from previous day: 11/20 0701 - 11/21 0700 In: 4674.7 [I.V.:1664.4; Blood:339.8; NG/GT:2330; IV Piggyback:270.5] Out: 9937 [Urine:2745; Stool:550]  Intake/Output this shift: Total I/O In: 1526.9 [I.V.:418.1; Blood:516; NG/GT:550; IV Piggyback:42.8] Out: 400 [Urine:400]  Lab Results: Recent Labs    07/01/2021 0351 06/26/2021 0357 07/01/2021 1553 06/29/2021 1558 07/01/21 0318 07/01/21 0325 07/01/21 0804 07/01/21 0952 07/01/21 1218  WBC 15.9*  --  14.8*  --  10.6*  --   --   --   --   HGB 10.2*   < > 9.3*   < > 7.8*   < > 7.5* 7.5* 8.2*  HCT 31.5*   < > 29.4*   < > 25.1*   < > 22.0* 22.0* 24.0*  PLT 81*  --  78*  --  65*   --   --   --   --    < > = values in this interval not displayed.   BMET Recent Labs    07/02/2021 0351 06/19/2021 0357 06/29/2021 1553 07/07/2021 1558 07/01/21 0318 07/01/21 0325 07/01/21 0804 07/01/21 0952 07/01/21 1218  NA 154*   < > 157*   < > 155*   < > 156* 156* 157*  K 4.8   < > 4.8   < > 4.1   < > 4.5 4.4 4.4  CL 117*  --  115*  --  119*  --   --   --   --   CO2 30  --  31  --  30  --   --   --   --   GLUCOSE 156*  --  154*  --  176*  --   --   --   --   BUN 117*  --  121*  --  126*  --   --   --   --   CREATININE 1.73*  --  1.91*  --  1.97*  --   --   --   --   CALCIUM 7.9*  --  8.2*  --  7.8*  --   --   --   --    < > = values in this interval not displayed.  LFT Recent Labs    06/29/21 0337 06/26/2021 0351 07/01/21 0318  PROT 5.6* 5.1* 4.8*  ALBUMIN 1.7* <1.5* <1.5*  AST 87* 57* 42*  ALT 80* 59* 45*  ALKPHOS 35* 31* 29*  BILITOT 0.6 0.5 0.5  BILIDIR <0.1 <0.1 <0.1  IBILI NOT CALCULATED NOT CALCULATED NOT CALCULATED   PT/INR Recent Labs    06/29/2021 0351 07/01/21 0318  LABPROT 16.7* 16.7*  INR 1.4* 1.4*   Hepatitis Panel No results for input(s): HEPBSAG, HCVAB, HEPAIGM, HEPBIGM in the last 72 hours.  Studies/Results: DG CHEST PORT 1 VIEW  Result Date: 07/01/2021 CLINICAL DATA:  Difficulty breathing EXAM: PORTABLE CHEST 1 VIEW COMPARISON:  Previous studies including the examination of 06/16/2021 FINDINGS: Transverse diameter of heart is within normal limits. Patchy infiltrates are seen in both lower lung fields, more so on the right side with interval worsening. There is prominence of interstitial markings in the parahilar regions. Tip of tracheostomy is proximally 9 cm above the carina. Enteric tube is noted traversing the esophagus. Tip of left jugular intravenous catheter is seen in the region of superior vena cava. There is a large caliber right jugular catheter, ECMO cannula with its distal course at the junction of inferior vena cava and right atrium.  IMPRESSION: There are patchy infiltrates in both lower lung fields, more so on the right side with interval worsening suggesting atelectasis/pneumonia. Other findings as described in the body of the report. Electronically Signed   By: Elmer Picker M.D.   On: 07/01/2021 08:29   DG CHEST PORT 1 VIEW  Result Date: 07/10/2021 CLINICAL DATA:  Respiratory failure. EXAM: PORTABLE CHEST 1 VIEW COMPARISON:  06/29/2021 FINDINGS: 0507 hours. Endotracheal tube tip is 7.7 cm above the base of the carina. A feeding tube passes into the stomach although the distal tip position is not included on the film. ECMO cannula again noted. Bibasilar atelectasis again noted with interval improvement in aeration at the left base. No substantial pleural effusion. Telemetry leads overlie the chest. IMPRESSION: 1. Interval improvement in aeration at the left base. 2. Otherwise no substantial change. Electronically Signed   By: Misty Stanley M.D.   On: 07/02/2021 08:30   DG Abd Portable 1V  Result Date: 06/22/2021 CLINICAL DATA:  Evaluate NG tube placement EXAM: PORTABLE ABDOMEN - 1 VIEW COMPARISON:  June 28, 2021 FINDINGS: The NG tube terminates in the stomach. IMPRESSION: The NG tube terminates in the stomach. Electronically Signed   By: Dorise Bullion III M.D.   On: 06/26/2021 15:17    Scheduled Meds:  arformoterol  15 mcg Nebulization BID   budesonide (PULMICORT) nebulizer solution  0.25 mg Nebulization BID   chlorhexidine gluconate (MEDLINE KIT)  15 mL Mouth Rinse BID   Chlorhexidine Gluconate Cloth  6 each Topical Daily   clonazePAM  1.5 mg Per Tube TID   collagenase   Topical Daily   emtricitabine-tenofovir AF  1 tablet Per Tube Daily   And   dolutegravir  50 mg Per Tube Daily   feeding supplement (PROSource TF)  45 mL Per Tube TID   free water  300 mL Per Tube Q4H   furosemide  80 mg Intravenous TID   ipratropium-albuterol  3 mL Nebulization Q4H   mouth rinse  15 mL Mouth Rinse 10 times per day    nutrition supplement (JUVEN)  1 packet Per Tube BID BM   oxyCODONE  15 mg Per Tube Q6H   pantoprazole (PROTONIX) IV  40 mg Intravenous Q12H  polyethylene glycol  17 g Per Tube BID   QUEtiapine  100 mg Per Tube BID   revefenacin  175 mcg Nebulization Daily   rosuvastatin  20 mg Per Tube Daily   senna-docusate  1 tablet Per Tube BID   sodium chloride flush  10-40 mL Intracatheter Q12H   sodium chloride flush  3 mL Intravenous Q12H   valproic acid  250 mg Per Tube TID   Continuous Infusions:  sodium chloride     sodium chloride 10 mL/hr at 07/08/2021 1502   sodium chloride     sodium chloride Stopped (06/21/2021 1341)   sodium chloride     amiodarone 30 mg/hr (07/01/21 1300)   ceFEPime (MAXIPIME) IV 2 g (07/01/21 1434)   dexmedetomidine (PRECEDEX) IV infusion 1 mcg/kg/hr (07/01/21 1300)   feeding supplement (PIVOT 1.5 CAL) 1,000 mL (07/01/21 0415)   HYDROmorphone 1 mg/hr (07/01/21 1300)   insulin 11.5 Units/hr (07/01/21 1300)   norepinephrine (LEVOPHED) Adult infusion 20 mcg/min (07/01/21 1407)   vasopressin 0.03 Units/min (07/01/21 1300)   PRN Meds:.sodium chloride, Place/Maintain arterial line **AND** sodium chloride, acetaminophen **OR** acetaminophen, albuterol, bisacodyl, dextrose, docusate, HYDROmorphone, lip balm, midazolam, ondansetron **OR** ondansetron (ZOFRAN) IV, sodium chloride flush, sodium chloride flush   ASSESMENT:   Upper GI bleed.  With melena, hemodynamic instability, anemia. 06/11/2021 EGD #1.  Briskly oozing AVM at duodenal bulb destroyed with APC cautery.  Multiple superficial duodenal ulcers beginning at D2 extending into examined duodenum, tissue appeared viable but suggestive of ischemic injury. 06/15/2021 EGD #2.  Post intervention superficial ulcer no active bleeding at site of recently ablated AVM.  No fresh or old blood. 07/01/2021 CT angio abdomen ordered, not yet completed.    Blood loss anemia.  Hgb Nadir 6.8 this morning >> PRBCs x 2 >> 8.2.  total  PRBCs: 18 units from 11/9 - today.    Chronic Eliquis for A. fib, currently on hold.  Oxygen dependent COPD, emphysema.  Recent admission and diagnosis of influenza.  Due to respiratory decompensation required intubation, vent, ECMO and subsequent trach.       HIV +.  On meds per ID    DM      AKI    Mild elevation transaminases, overall improving..  Hepatic steatosis on ultrasound in 08/2016  PLAN   Await findings of CT angiography, if active bleeding, should pursue angiogram/embolization.    Azucena Freed  07/01/2021, 2:44 PM Phone [phone]

## 2021-07-01 NOTE — Telephone Encounter (Signed)
Requested Prescriptions  Pending Prescriptions Disp Refills  . amitriptyline (ELAVIL) 25 MG tablet [Pharmacy Med Name: AMITRIPTYLINE HCL 25 MG TAB] 90 tablet 0    Sig: TAKE 1 TABLET BY MOUTH EVERYDAY AT BEDTIME     Psychiatry:  Antidepressants - Heterocyclics (TCAs) Passed - 07/01/2021 12:08 AM      Passed - Completed PHQ-2 or PHQ-9 in the last 360 days      Passed - Valid encounter within last 6 months    Recent Outpatient Visits          2 months ago Type 2 diabetes mellitus with hyperglycemia, without long-term current use of insulin [facility])   [facility] [name], [place] W, NP   1 year ago Type 2 diabetes mellitus with hyperglycemia, without long-term current use of insulin [facility])   [facility] [place], [place] W, NP   1 year ago Type 2 diabetes mellitus with hyperglycemia, without long-term current use of insulin (HCC)   [facility] [place], [place], MD   1 year ago Type 2 diabetes mellitus with hyperglycemia, without long-term current use of insulin [facility])   [facility] [place], Shea Stakes, NP   1 year ago Encounter for annual physical exam   [facility] [place], Shea Stakes, NP      Future Appointments            In 3 months Calone, Tama Headings, FNP [facility]

## 2021-07-01 NOTE — Progress Notes (Signed)
Patient ID: Chris [name]., male   DOB: Dec 15, 1965, 55 y.o.   MRN: [id number]  Extracorporeal support note     ECLS support day: 14 Indication: Severe COPD with influenza A   Configuration: VV ECMO, Crescent catheter right IJ   Drainage cannula: Right IJ Crescent Return cannula: Right IJ Crescent   Pump speed: 3500 Pump flow: 4.69 Pump used: Cardiohelp   Sweep gas: 0.0 (on sweep trial since early this am)   Circuit check: Small area of thrombus 3 o'clock  (unchanged). Increasing amount of fibrin strands Anticoagulant: Off bivalirudin since 11/19 due to active GI bleeding   Changes in support: Doing well with sweep trial, repeat gas and hope to decannulate.     Anticipated goals/duration of support: wean to decannulation  [name] MD 07/01/2021 7:36 AM

## 2021-07-01 NOTE — Progress Notes (Signed)
Nutrition Follow-up  DOCUMENTATION CODES:   Obesity unspecified  INTERVENTION:   Tube Feeding via Cortrak:  Pivot 1.5 at 70 ml/hr x 21 hours (holding 2 hours before and 1 hour after [place] dose-however not routinely being done) Prosource TF 45 ml TID Provides 2325 kcals, 171 g of protein and 1117 mL of free water  Add Juven BID, each packet provides 80 calories, 8 grams of carbohydrate, 2.5  grams of protein (collagen), 7 grams of L-arginine and 7 grams of L-glutamine; supplement contains CaHMB, Vitamins C, E, B12 and Zinc to promote wound healing    NUTRITION DIAGNOSIS:   Inadequate oral intake related to inability to eat as evidenced by NPO status.  Being addressed  GOAL:   Provide needs based on ASPEN/SCCM guidelines  Progressing  MONITOR:   Vent status, TF tolerance, Labs  REASON FOR ASSESSMENT:   Ventilator, Consult Enteral/tube feeding initiation and management  ASSESSMENT:   55 yo male admitted with respiratory distress r/t influenza A. Required intubation on 11/3. PMH includes COPD, on 2 L oxygen at baseline, A fib, HF, HTN, DM-2, HIV.  11/03 Admit 11/07 VV ECMO cannulation 11/09 Cortrak advanced to LOT  11/10 Trach placed 11/14 Trach exchange to XLT due to cuff leak 11/18 EGD: actively bleeding  duodenal bulb AVM injected and cauterized, duodenal ischemia 11/20 Repeat EGD without signs of new active bleed 11/21 VV ECMO decannulation  Pt remains sedated on vent support, remains on levophed and vasopressin  Pivot 1.5 at 70 ml/hr, Pro-Source TF 45 mL TID via Cortrak  Hypernatremia persists, noted free water increased today  WOC RN evaluated this AM for unstageable wound to coccyx  Current wt 118 kg Admit wt 108 kg  Labs: sodium 157 (H) Meds: lasix, insulin drip   Diet Order:   Diet Order             Diet NPO time specified  Diet effective now                   EDUCATION NEEDS:   Not appropriate for education at this time  Skin:   Skin Assessment: Skin Integrity Issues: Skin Integrity Issues:: Unstageable Unstageable: coccyx  Last BM:  11/21  Height:   Ht Readings from Last 1 Encounters:  06/23/21 5\' 9"  (1.753 m)    Weight:   Wt Readings from Last 1 Encounters:  07/01/21 118.2 kg    BMI:  Body mass index is 38.48 kg/m.  Estimated Nutritional Needs:   Kcal:  2200-2500 kcals  Protein:  145-180 g  Fluid:  >/= 2 L  06/29/2021 MS, RDN, LDN, CNSC Registered Dietitian III Clinical Nutrition RD Pager and On-Call Pager Number Located in [place]

## 2021-07-01 NOTE — Progress Notes (Signed)
ECMO Sweep Trial   A sweep trial was started at 0400 per Dr. Denese Killings.  Vent settings:  FiO2:  80% PEEP:  12  The following trial parameters have been discussed:  pH > 7.25, pCO2 < 70, SpO2 > 88%.  Sweep trial passed, and patient was decannulated at 1027 by Drs. Agarwala and Smith. VSS.  ABG ABG - Last 2 Results Latest Ref Rng & Units 07/01/2021 07/01/2021  PH ART 7.350 - 7.450 7.340(L) 7.367  PCO2 ART 32.0 - 48.0 mmHg 63.0(H) 56.9(H)  PO2 ART 83.0 - 108.0 mmHg 63(L) 102  BICARBONATE 20.0 - 28.0 mmol/L 33.8(H) 32.7(H)  ACID-BASE EXCESS 0.0 - 2.0 mmol/L 7.0(H) 7.0(H)  O2 SAT % 89.0 97.0  Some recent data might be hidden

## 2021-07-01 NOTE — Progress Notes (Signed)
PT Cancellation Note  Patient Details Name: Chris [name]. MRN: [id number] DOB: 01-Oct-1965   Cancelled Treatment:    Reason Eval/Treat Not Completed: Patient's level of consciousness. Pt remains very sedated. Is now off ECMO. Will continue to monitor.    Angelina Ok Maycok 07/01/2021, 2:23 PM Skip Mayer PT Acute Rehabilitation Services Pager [phone] Office [phone]

## 2021-07-01 NOTE — Progress Notes (Signed)
Patient ID: Chris Round., male   DOB: 1965-08-17, 55 y.o.   MRN: [id number]     Advanced Heart Failure Rounding Note  PCP-Cardiologist: Glenetta Hew, MD   Subjective:    11/7: VV ECMO cannulation 11/10: Tracheostomy 11/14: Lurline Idol exchange 11/17: EGD for UGIB, APC duodenal AVM.  Trach aspirate 11/17 with Pseudomonas and MSSA, 11/13 trach aspirate still with Pseudomonas.  Oseltamivir for influenza completed. 11/20: Sweep trial.  Repeat EGD with no new lesion.    Remains on ECMO and vent FiO2 0.8. Doing well so far with sweep trial.   He remains off bivalirudin.  Continuing to get blood, 1 unit today.  Hgb 7.8. Melena clearing.   On NE 2 and VP 0.03. On amiodarone gtt with HR 110s, AF.  Na up to 155.   I/Os positive, weight trending back up.   ECMO: 3500 rpm Flow 4.69 Pvenous -88 DeltaP 34 Sweep 0 ABG 7.34/63/63/89% PTT - off bival x > 24 hours LDH 234  Objective:   Weight Range: 118.2 kg Body mass index is 38.48 kg/m.   Vital Signs:   Temp:  [98.1 F (36.7 C)-99.5 F (37.5 C)] 98.4 F (36.9 C) (11/21 0624) Pulse Rate:  [94-102] 94 (11/20 1343) Resp:  [10-25] 23 (11/21 0700) BP: (101-111)/(36-55) 111/36 (11/21 0510) SpO2:  [78 %-100 %] 90 % (11/21 0700) Arterial Line BP: (86-156)/(36-73) 108/42 (11/21 0700) FiO2 (%):  [50 %-100 %] 80 % (11/21 0400) Weight:  [118.2 kg] 118.2 kg (11/21 0630) Last BM Date: 07/01/21  Weight change: Filed Weights   06/29/21 0500 06/28/2021 0230 07/01/21 0630  Weight: 115.5 kg 116 kg 118.2 kg    Intake/Output:   Intake/Output Summary (Last 24 hours) at 07/01/2021 0738 Last data filed at 07/01/2021 0700 Gross per 24 hour  Intake 4674.68 ml  Output 3295 ml  Net 1379.68 ml      Physical Exam    General: sedated on vent Neck: RIJ [place], LIJ CVL, no thyromegaly or thyroid nodule.  Lungs: Decreased at bases. CV: Nondisplaced PMI.  Heart mildly tachy, irregular S1/S2, no S3/S4, no murmur.  No peripheral edema.    Abdomen: Soft, nontender, no hepatosplenomegaly, no distention.  Skin: Intact without lesions or rashes.  Neurologic: Sedated.  Extremities: No clubbing or cyanosis.  HEENT: Normal.    Telemetry   Atrial fibrillation 110s Personally reviewed  Labs    CBC Recent Labs    06/12/2021 1553 07/04/2021 1558 07/01/21 0318 07/01/21 0325 07/01/21 0440 07/01/21 0626  WBC 14.8*  --  10.6*  --   --   --   HGB 9.3*   < > 7.8*   < > 6.8* 7.5*  HCT 29.4*   < > 25.1*   < > 20.0* 22.0*  MCV 94.5  --  96.2  --   --   --   PLT 78*  --  65*  --   --   --    < > = values in this interval not displayed.   Basic Metabolic Panel Recent Labs    06/12/2021 1553 06/18/2021 1558 07/01/21 0318 07/01/21 0325 07/01/21 0440 07/01/21 0626  NA 157*   < > 155*   < > 157* 157*  K 4.8   < > 4.1   < > 4.1 4.5  CL 115*  --  119*  --   --   --   CO2 31  --  30  --   --   --   GLUCOSE 154*  --  176*  --   --   --   BUN 121*  --  126*  --   --   --   CREATININE 1.91*  --  1.97*  --   --   --   CALCIUM 8.2*  --  7.8*  --   --   --    < > = values in this interval not displayed.   Liver Function Tests Recent Labs    07/07/2021 0351 07/01/21 0318  AST 57* 42*  ALT 59* 45*  ALKPHOS 31* 29*  BILITOT 0.5 0.5  PROT 5.1* 4.8*  ALBUMIN <1.5* <1.5*   No results for input(s): LIPASE, AMYLASE in the last 72 hours. Cardiac Enzymes No results for input(s): CKTOTAL, CKMB, CKMBINDEX, TROPONINI in the last 72 hours.  BNP: BNP (last 3 results) Recent Labs    10/29/20 1214  BNP 102.9*    ProBNP (last 3 results) No results for input(s): PROBNP in the last 8760 hours.   D-Dimer No results for input(s): DDIMER in the last 72 hours. Hemoglobin A1C No results for input(s): HGBA1C in the last 72 hours. Fasting Lipid Panel No results for input(s): CHOL, HDL, LDLCALC, TRIG, CHOLHDL, LDLDIRECT in the last 72 hours.  Thyroid Function Tests No results for input(s): TSH, T4TOTAL, T3FREE, THYROIDAB in the last 72  hours.  Invalid input(s): FREET3  Other results:   Imaging    DG Abd Portable 1V  Result Date: 07/02/2021 CLINICAL DATA:  Evaluate NG tube placement EXAM: PORTABLE ABDOMEN - 1 VIEW COMPARISON:  June 28, 2021 FINDINGS: The NG tube terminates in the stomach. IMPRESSION: The NG tube terminates in the stomach. Electronically Signed   By: Dorise Bullion III M.D.   On: 07/08/2021 15:17     Medications:     Scheduled Medications:  arformoterol  15 mcg Nebulization BID   budesonide (PULMICORT) nebulizer solution  0.25 mg Nebulization BID   chlorhexidine gluconate (MEDLINE KIT)  15 mL Mouth Rinse BID   Chlorhexidine Gluconate Cloth  6 each Topical Daily   clonazePAM  1.5 mg Per Tube TID   emtricitabine-tenofovir AF  1 tablet Per Tube Daily   And   dolutegravir  50 mg Per Tube Daily   feeding supplement (PROSource TF)  45 mL Per Tube TID   free water  300 mL Per Tube Q4H   furosemide  60 mg Intravenous TID   ipratropium-albuterol  3 mL Nebulization Q4H   mouth rinse  15 mL Mouth Rinse 10 times per day   oxyCODONE  15 mg Per Tube Q6H   pantoprazole (PROTONIX) IV  40 mg Intravenous Q12H   polyethylene glycol  17 g Per Tube BID   QUEtiapine  100 mg Per Tube BID   revefenacin  175 mcg Nebulization Daily   rosuvastatin  20 mg Per Tube Daily   senna-docusate  1 tablet Per Tube BID   sodium chloride flush  10-40 mL Intracatheter Q12H   sodium chloride flush  3 mL Intravenous Q12H   valproic acid  250 mg Per Tube TID    Infusions:  sodium chloride     sodium chloride 10 mL/hr at 06/15/2021 1502   sodium chloride     sodium chloride Stopped (06/11/2021 1341)   sodium chloride     amiodarone 30 mg/hr (07/01/21 0700)   ceFEPime (MAXIPIME) IV 200 mL/hr at 07/01/21 0700   dexmedetomidine (PRECEDEX) IV infusion 1 mcg/kg/hr (07/01/21 0700)   feeding supplement (PIVOT 1.5 CAL) 1,000 mL (07/01/21  3716)   HYDROmorphone 1 mg/hr (07/01/21 0700)   insulin 9 Units/hr (07/01/21 0700)    norepinephrine (LEVOPHED) Adult infusion 2 mcg/min (07/01/21 0700)   vasopressin 0.03 Units/min (07/01/21 0700)    PRN Medications: sodium chloride, Place/Maintain arterial line **AND** sodium chloride, acetaminophen **OR** acetaminophen, albuterol, bisacodyl, dextrose, docusate, HYDROmorphone, lip balm, midazolam, ondansetron **OR** ondansetron (ZOFRAN) IV, sodium chloride flush, sodium chloride flush   Assessment/Plan   1. Acute hypoxemic and hypercarbic respiratory failure: In setting of COPD on home oxygen at baseline.  He has severe influenza infection.  Refractory hypoxemia and hypercarbia with maximal management.  He has underlying significant lung disease but has reasonable quality of life and had ARDS in the past requiring tracheostomy but was able to wean off.  RESP score was acceptable.  VV ECMO cannulation on 11/7.  Pseudomonas and MSSA in trach aspirate from 11/7.  Tracheostomy 11/10.  Has been on sweep trial again this morning. ABG 7.34/63/63/89% with sats currently 92-93%.  I/Os positive with PRBCs.  BUN now markedly elevated (126), suspect in part due to UGIB. Need negative I/Os with significant weight gain.  - Repeat ABG later this morning and plan for decannulation.  Bivalirudin has been off.  - Lasix 60 mg IV q8h today. - Antibiotic coverage with cefepime - Anticipate prolonged course with trach.  2. Atrial fibrillation: He has been in atrial fibrillation on all ECGs since 2021, suspect chronic at this point. On Eliquis at home. Soft BP has limited metoprolol. Rate controlled on current regimen.  - Holding bival for now with active GIB bleed  - Continue IV amiodarone for now, hopefully to po soon.   - Consider cardioversion in future.  3.  AKI: Creatinine up to 1.97.  BUN elevated but in part due to UGIB. - Lasix 60 mg IV every 8 hrs today.  - follow BMET 4. Type 2 DM: Still on insulin gtt.  5. Influenza: Severe.  Completed oseltamivir.  6. COPD: Still smokes 1-2 cigs/day.   Intermittent home oxygen.  Followed by pulmonary, has been stable at home for years but baseline DLCO was very low.  7. HIV: Last viral load was undetectable.  On treatment.  8. Shock: Septic + hemorrhagic.  Echo showed EF 55-60% with normal RV. This morning on NE 2, vasopressin 0.03.  Improved with resolution of UGIB.  - Continue to wean pressors as able.  9. FEN: Tube feeds.  10. 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 Repeat EGD 11/20 without new lesion, suspect deeper AVM in small bowel.  Now off bivalirudin given rebleeding when restarted.  - BID PPI - Keep hgb > 9.0 to facilitate oxygenation, getting PRBCs this morning.  - GI did not recommend octreotide.  11. Hypernatremia: Na 155 today. - Increase free water to 300 q4.    12. Severe protein/calorie malnutrition - albumin < 1.5 - resume TFs when able  CRITICAL CARE Performed by: Loralie Champagne  Total critical care time: 50 minutes  Critical care time was exclusive of separately billable procedures and treating other patients.  Critical care was necessary to treat or prevent imminent or life-threatening deterioration.  Critical care was time spent personally by me on the following activities: development of treatment plan with patient and/or surrogate as well as nursing, discussions with consultants, evaluation of patient's response to treatment, examination of patient, obtaining history from patient or surrogate, ordering and performing treatments and interventions, ordering and review of laboratory studies, ordering and review of radiographic studies,  pulse oximetry and re-evaluation of patient's condition.     Length of Stay: Coleman, MD  07/01/2021, 7:38 AM  Advanced Heart Failure Team Pager [phone] (M-F; 7a - 5p)  Please contact [facility] for night-coverage after hours (5p -7a ) and weekends on [email or web address]

## 2021-07-01 NOTE — Consult Note (Addendum)
WOC Nurse Consult Note: Patient receiving care in [facility] Intubated on [facility] Reason for Consult: Coccyx wound Wound type: Unstageable (black/yellow) on the coccyx with surrounding MASD on the right buttock  Pressure Injury POA: No Measurement: 4 x 3 x 1.5 Wound bed: Black/yellow. Surround wounds are full thickness and are pink and moist Drainage (amount, consistency, odor) brown on sacral foam Periwound: Intact Dressing procedure/placement/frequency: Cleanse the entire sacral area with no rinse cleanser, pat dry. Place a nickel thick layer of Santyl on the coccyx wound and cover with a moistened saline gauze, secure with sacral foam dressing. Change daily. Surrounding pink wounds on the right buttock, place a small cut to fit piece of Xeroform gauze over the wounds and secure with sacral foam dressing. Change Daily.   Monitor the wound area(s) for worsening of condition such as: Signs/symptoms of infection, increase in size, development of or worsening of odor, development of pain, or increased pain at the affected locations.   Notify the medical team if any of these develop.  Thank you for the consult. WOC nurse will will follow weekly.  Please re-consult the WOC team if needed.  Renaldo Reel Katrinka Blazing, MSN, RN, CMSRN, Angus Seller, [facility] Wound Treatment Associate Pager [phone]

## 2021-07-01 NOTE — Progress Notes (Signed)
NAME:  Chris [name]., MRN:  [id number], DOB:  May 14, 1966, LOS: 95 ADMISSION DATE:  07/10/2021, CONSULTATION DATE:  06/15/2019 REFERRING MD:  Ruthann Cancer, CHIEF COMPLAINT:  hypercapnic respiratory failure   History of Present Illness:   55 year old man with O2 dependent COPD presenting with acute on chronic respiratory failure now on VV ECMO due to refractory hypoxia and hypercapnia.  Pertinent  Medical History   Past Medical History:  Diagnosis Date   Anxiety    Atrial fibrillation (HCC)    CHF (congestive heart failure) (HCC)    COPD (chronic obstructive pulmonary disease) ([place])    Emphysema [[name]]   Depression    Diabetes mellitus without complication ([place])    type 2   Emphysema of lung ([place])    GERD (gastroesophageal reflux disease)    HIV disease ([place])    Hypertension    Oxygen deficiency     Significant Hospital Events: Including procedures, antibiotic start and stop dates in addition to other pertinent events   11/7- VV ECMO started 11/9 - weaning sedation. Transfused 2 units PRBC to keep O2 carrying capacity.  11/9 - remains agitated with wake up  11/10 - tracheostomy tube placed 11/14 - trach exchanged to distal XLT due to cuff leak 11/17  - weaned sweep down to 1.5lpm prior to developing melena 11/17  - EGD bleeding duodenal bulb AVM injected and cauterized. Duodenal bulb ischemia. 11/19 stool became maroon.  No improvement in hemoglobin despite 4 units of blood.  Increasing vasopressor requirements.  Despite this, tolerated sweep trial. 11/20 off bivalirudin, maroon stools have resolved.  Repeat endoscopy shows no active signs of bleeding.    Interim History / Subjective:   Scoped yesterday - no active bleeding seen. Vasopressor weaning. Sedation decreased.  On sweep trial since again this morning.   Objective   Blood pressure (!) 111/36, pulse 94, temperature 98.4 F (36.9 C), temperature source Core, resp. rate (!) 23, height 5\' 9"  (1.753 m), weight 118.2 kg,  SpO2 94 %.    Vent Mode: PSV;CPAP FiO2 (%):  [50 %-100 %] 100 % Set Rate:  [15 bmp] 15 bmp PEEP:  [12 cmH20] 12 cmH20 Pressure Support:  [22 cmH20] 22 cmH20 Plateau Pressure:  [27 cmH20-31 cmH20] 31 cmH20   Intake/Output Summary (Last 24 hours) at 07/01/2021 0757 Last data filed at 07/01/2021 0700 Gross per 24 hour  Intake 4674.68 ml  Output 3295 ml  Net 1379.68 ml    Filed Weights   06/29/21 0500 07/10/2021 0230 07/01/21 0630  Weight: 115.5 kg 116 kg 118.2 kg    Examination: General: critically ill appearing man lying in bed trached, sedated, on ecmo HEENT: Capac/AT, eyes anicteric Neck: no bleeding or drainage around trach, Shiley cuffed distal XLT #8 Resp: no wheezing or rhonchi, synchronous with MV now on PSV.  Patient breathing comfortably.  Wants to take large slow breaths consistent with his preintubation pattern. Cardio: S1S2, irreg rhythm, reg rate GI: abdomen soft with dark effluent from flexiseal  Neuro: RASS -3, intact cough reflex, PERRL Extremities: moderate dependent edema, no cyanosis GU: yellow urine, foley Derm: warm, dry, no rashes or ecchymoses  Ancillary tests:    CXR reviewed and shows both lung fields clear with cannula in good position.  Bibasilar atelectasis.  ABG: mild compensated respiratory acidosis.  Leukocytosis improved Sodium 157 Assessment & Plan:   Critically ill  due to acute on chronic hypoxic and hypercapnic respiratory failure from influenza pneumonia on background of severe COPD, prior  tracheostomy. On HOT 10 years. Acute COPD exacerbation; severe underlying COPD- Pseudomonas and MSSA HAP Now trach dependent Concern for undiagnosed OSA Acute metabolic encephalopathy with severe agitation HIV, undetectable VL Distributive and hemorrhagic shock due to sedation Acute blood loss anemia due to acute bleeding duodenal AVM Chronic Afib w/RVR H/o HFpEF GERD Type 2 diabetes with hyperglycemia Hypernatremia from high insensitive  losses Deconditioning, acute on chronic  Plan:   -Abbreviated sweep trial and decannulation today ( essentially obligated given inability to tolerate bivalirudin ).  - Oxygenation will likely improve as patient is allowed to breath spontaneously and sedation decreased as increased diaphragmatic activity will help with V/Q mismatch from basilar atelectasis.  - Continue to wean vasopressors. Decreasing sedation and removal of ECMO circuit should help - Continue PPI, if re-bleeds will need CT angio.  - Transfuse to keep HB >9 to improve O2 carrying capacity as patient lives at higher HB - Continues on IV insulin - transition to [place] once off ECMO - Continue to tid furosemide to achieve negative fluid balance.   Best Practice (right click and "Reselect all SmartList Selections" daily)   Diet/type: tubefeeds  DVT prophylaxis: bivalirudin GI prophylaxis: PPI Lines: Central line and Arterial Line Foley:  external catheter Code Status:  full code Last date of multidisciplinary goals of care discussion: continue for family updates   CRITICAL CARE Performed by: [name]  Total critical care time: 40 minutes  Critical care time was exclusive of separately billable procedures and treating other patients.  Critical care was necessary to treat or prevent imminent or life-threatening deterioration.  Critical care was time spent personally by me on the following activities: development of treatment plan with patient and/or surrogate as well as nursing, discussions with consultants, evaluation of patient's response to treatment, examination of patient, obtaining history from patient or surrogate, ordering and performing treatments and interventions, ordering and review of laboratory studies, ordering and review of radiographic studies, pulse oximetry, re-evaluation of patient's condition and participation in multidisciplinary rounds.  [name], MD [facility] ICU Physician [facility]  Critical  Care  Pager: [phone] Mobile: [phone] After hours: [phone].

## 2021-07-01 NOTE — Plan of Care (Signed)
  Problem: Clinical Measurements: Goal: Ability to maintain clinical measurements within normal limits will improve Outcome: Progressing Goal: Will remain free from infection Outcome: Progressing Goal: Respiratory complications will improve Outcome: Progressing Goal: Cardiovascular complication will be avoided Outcome: Progressing   Problem: Nutrition: Goal: Adequate nutrition will be maintained Outcome: Progressing   Problem: Pain Managment: Goal: General experience of comfort will improve Outcome: Progressing   Problem: Safety: Goal: Ability to remain free from injury will improve Outcome: Progressing   Problem: Safety: Goal: Non-violent Restraint(s) Outcome: Progressing

## 2021-07-02 ENCOUNTER — Inpatient Hospital Stay (HOSPITAL_COMMUNITY): Payer: Medicare HMO

## 2021-07-02 ENCOUNTER — Encounter (HOSPITAL_COMMUNITY): Payer: Self-pay | Admitting: Gastroenterology

## 2021-07-02 DIAGNOSIS — J101 Influenza due to other identified influenza virus with other respiratory manifestations: Secondary | ICD-10-CM | POA: Diagnosis not present

## 2021-07-02 DIAGNOSIS — J80 Acute respiratory distress syndrome: Secondary | ICD-10-CM | POA: Diagnosis not present

## 2021-07-02 DIAGNOSIS — I4891 Unspecified atrial fibrillation: Secondary | ICD-10-CM | POA: Diagnosis not present

## 2021-07-02 DIAGNOSIS — N179 Acute kidney failure, unspecified: Secondary | ICD-10-CM | POA: Diagnosis not present

## 2021-07-02 DIAGNOSIS — J9620 Acute and chronic respiratory failure, unspecified whether with hypoxia or hypercapnia: Secondary | ICD-10-CM

## 2021-07-02 LAB — CBC
HCT: 26.8 % — ABNORMAL LOW (ref 39.0–52.0)
HCT: 25.3 % — ABNORMAL LOW (ref 39.0–52.0)
HCT: 24.8 % — ABNORMAL LOW (ref 39.0–52.0)
HCT: 25.1 % — ABNORMAL LOW (ref 39.0–52.0)
Hemoglobin: 8.2 g/dL — ABNORMAL LOW (ref 13.0–17.0)
Hemoglobin: 7.7 g/dL — ABNORMAL LOW (ref 13.0–17.0)
Hemoglobin: 7.6 g/dL — ABNORMAL LOW (ref 13.0–17.0)
Hemoglobin: 7.6 g/dL — ABNORMAL LOW (ref 13.0–17.0)
MCH: 29.4 pg (ref 26.0–34.0)
MCH: 29.2 pg (ref 26.0–34.0)
MCH: 29.5 pg (ref 26.0–34.0)
MCH: 29.7 pg (ref 26.0–34.0)
MCHC: 30.6 g/dL (ref 30.0–36.0)
MCHC: 30.4 g/dL (ref 30.0–36.0)
MCHC: 30.6 g/dL (ref 30.0–36.0)
MCHC: 30.3 g/dL (ref 30.0–36.0)
MCV: 96.1 fL (ref 80.0–100.0)
MCV: 95.8 fL (ref 80.0–100.0)
MCV: 96.1 fL (ref 80.0–100.0)
MCV: 98 fL (ref 80.0–100.0)
Platelets: 92 10*3/uL — ABNORMAL LOW (ref 150–400)
Platelets: 90 10*3/uL — ABNORMAL LOW (ref 150–400)
Platelets: 91 10*3/uL — ABNORMAL LOW (ref 150–400)
Platelets: 93 10*3/uL — ABNORMAL LOW (ref 150–400)
RBC: 2.79 MIL/uL — ABNORMAL LOW (ref 4.22–5.81)
RBC: 2.64 MIL/uL — ABNORMAL LOW (ref 4.22–5.81)
RBC: 2.58 MIL/uL — ABNORMAL LOW (ref 4.22–5.81)
RBC: 2.56 MIL/uL — ABNORMAL LOW (ref 4.22–5.81)
RDW: 18.6 % — ABNORMAL HIGH (ref 11.5–15.5)
RDW: 18.5 % — ABNORMAL HIGH (ref 11.5–15.5)
RDW: 19.2 % — ABNORMAL HIGH (ref 11.5–15.5)
RDW: 18.8 % — ABNORMAL HIGH (ref 11.5–15.5)
WBC: 11 10*3/uL — ABNORMAL HIGH (ref 4.0–10.5)
WBC: 10.8 10*3/uL — ABNORMAL HIGH (ref 4.0–10.5)
WBC: 8.9 10*3/uL (ref 4.0–10.5)
WBC: 8.9 10*3/uL (ref 4.0–10.5)
nRBC: 1.6 % — ABNORMAL HIGH (ref 0.0–0.2)
nRBC: 1.6 % — ABNORMAL HIGH (ref 0.0–0.2)
nRBC: 1.2 % — ABNORMAL HIGH (ref 0.0–0.2)
nRBC: 1 % — ABNORMAL HIGH (ref 0.0–0.2)

## 2021-07-02 LAB — BPAM RBC
Blood Product Expiration Date: 202212092359
Blood Product Expiration Date: 202212132359
Blood Product Expiration Date: 202212132359
Blood Product Expiration Date: 202212132359
Blood Product Expiration Date: 202212132359
Blood Product Expiration Date: 202212142359
Blood Product Expiration Date: 202212142359
Blood Product Expiration Date: 202212142359
Blood Product Expiration Date: 202212162359
Blood Product Expiration Date: 202212212359
ISSUE DATE / TIME: 202211191558
ISSUE DATE / TIME: 202211191634
ISSUE DATE / TIME: 202211200034
ISSUE DATE / TIME: 202211200034
ISSUE DATE / TIME: 202211210459
ISSUE DATE / TIME: 202211211019
ISSUE DATE / TIME: 202211211019
ISSUE DATE / TIME: 202211220643
Unit Type and Rh: 6200
Unit Type and Rh: 6200
Unit Type and Rh: 6200
Unit Type and Rh: 6200
Unit Type and Rh: 6200
Unit Type and Rh: 6200
Unit Type and Rh: 6200
Unit Type and Rh: 6200
Unit Type and Rh: 6200
Unit Type and Rh: 6200

## 2021-07-02 LAB — TYPE AND SCREEN
ABO/RH(D): A POS
Antibody Screen: NEGATIVE
Unit division: 0
Unit division: 0
Unit division: 0
Unit division: 0
Unit division: 0
Unit division: 0
Unit division: 0
Unit division: 0
Unit division: 0
Unit division: 0

## 2021-07-02 LAB — GLUCOSE, CAPILLARY
Glucose-Capillary: 109 mg/dL — ABNORMAL HIGH (ref 70–99)
Glucose-Capillary: 136 mg/dL — ABNORMAL HIGH (ref 70–99)
Glucose-Capillary: 141 mg/dL — ABNORMAL HIGH (ref 70–99)
Glucose-Capillary: 147 mg/dL — ABNORMAL HIGH (ref 70–99)
Glucose-Capillary: 164 mg/dL — ABNORMAL HIGH (ref 70–99)
Glucose-Capillary: 167 mg/dL — ABNORMAL HIGH (ref 70–99)
Glucose-Capillary: 167 mg/dL — ABNORMAL HIGH (ref 70–99)
Glucose-Capillary: 169 mg/dL — ABNORMAL HIGH (ref 70–99)
Glucose-Capillary: 171 mg/dL — ABNORMAL HIGH (ref 70–99)
Glucose-Capillary: 173 mg/dL — ABNORMAL HIGH (ref 70–99)
Glucose-Capillary: 173 mg/dL — ABNORMAL HIGH (ref 70–99)
Glucose-Capillary: 173 mg/dL — ABNORMAL HIGH (ref 70–99)
Glucose-Capillary: 177 mg/dL — ABNORMAL HIGH (ref 70–99)
Glucose-Capillary: 200 mg/dL — ABNORMAL HIGH (ref 70–99)

## 2021-07-02 LAB — HEPATIC FUNCTION PANEL
ALT: 47 U/L — ABNORMAL HIGH (ref 0–44)
AST: 50 U/L — ABNORMAL HIGH (ref 15–41)
Albumin: <1.5 g/dL — ABNORMAL LOW (ref 3.5–5.0)
Alkaline Phosphatase: 33 U/L — ABNORMAL LOW (ref 38–126)
Bilirubin, Direct: <0.1 mg/dL (ref 0.0–0.2)
Total Bilirubin: 0.5 mg/dL (ref 0.3–1.2)
Total Protein: 5.5 g/dL — ABNORMAL LOW (ref 6.5–8.1)

## 2021-07-02 LAB — CULTURE, BLOOD (ROUTINE X 2)
Culture: NO GROWTH
Culture: NO GROWTH
Special Requests: ADEQUATE
Special Requests: ADEQUATE

## 2021-07-02 LAB — BASIC METABOLIC PANEL
Anion gap: 6 (ref 5–15)
Anion gap: 6 (ref 5–15)
BUN: 138 mg/dL — ABNORMAL HIGH (ref 6–20)
BUN: 126 mg/dL — ABNORMAL HIGH (ref 6–20)
CO2: 32 mmol/L (ref 22–32)
CO2: 33 mmol/L — ABNORMAL HIGH (ref 22–32)
Calcium: 7.9 mg/dL — ABNORMAL LOW (ref 8.9–10.3)
Calcium: 7.9 mg/dL — ABNORMAL LOW (ref 8.9–10.3)
Chloride: 116 mmol/L — ABNORMAL HIGH (ref 98–111)
Chloride: 114 mmol/L — ABNORMAL HIGH (ref 98–111)
Creatinine, Ser: 2.16 mg/dL — ABNORMAL HIGH (ref 0.61–1.24)
Creatinine, Ser: 2 mg/dL — ABNORMAL HIGH (ref 0.61–1.24)
GFR, Estimated: 35 mL/min — ABNORMAL LOW (ref >60)
GFR, Estimated: 39 mL/min — ABNORMAL LOW (ref >60)
Glucose, Bld: 175 mg/dL — ABNORMAL HIGH (ref 70–99)
Glucose, Bld: 189 mg/dL — ABNORMAL HIGH (ref 70–99)
Potassium: 4.3 mmol/L (ref 3.5–5.1)
Potassium: 4.2 mmol/L (ref 3.5–5.1)
Sodium: 154 mmol/L — ABNORMAL HIGH (ref 135–145)
Sodium: 153 mmol/L — ABNORMAL HIGH (ref 135–145)

## 2021-07-02 LAB — POCT I-STAT 7, (LYTES, BLD GAS, ICA,H+H)
Acid-Base Excess: 6 mmol/L — ABNORMAL HIGH (ref 0.0–2.0)
Acid-Base Excess: 6 mmol/L — ABNORMAL HIGH (ref 0.0–2.0)
Bicarbonate: 31.8 mmol/L — ABNORMAL HIGH (ref 20.0–28.0)
Bicarbonate: 31.8 mmol/L — ABNORMAL HIGH (ref 20.0–28.0)
Calcium, Ion: 1.16 mmol/L (ref 1.15–1.40)
Calcium, Ion: 1.15 mmol/L (ref 1.15–1.40)
HCT: 23 % — ABNORMAL LOW (ref 39.0–52.0)
HCT: 21 % — ABNORMAL LOW (ref 39.0–52.0)
Hemoglobin: 7.8 g/dL — ABNORMAL LOW (ref 13.0–17.0)
Hemoglobin: 7.1 g/dL — ABNORMAL LOW (ref 13.0–17.0)
O2 Saturation: 91 %
O2 Saturation: 92 %
Patient temperature: 99.7
Patient temperature: 99.7
Potassium: 4.3 mmol/L (ref 3.5–5.1)
Potassium: 4.4 mmol/L (ref 3.5–5.1)
Sodium: 154 mmol/L — ABNORMAL HIGH (ref 135–145)
Sodium: 154 mmol/L — ABNORMAL HIGH (ref 135–145)
TCO2: 33 mmol/L — ABNORMAL HIGH (ref 22–32)
TCO2: 34 mmol/L — ABNORMAL HIGH (ref 22–32)
pCO2 arterial: 57.9 mmHg — ABNORMAL HIGH (ref 32.0–48.0)
pCO2 arterial: 58.9 mmHg — ABNORMAL HIGH (ref 32.0–48.0)
pH, Arterial: 7.35 (ref 7.350–7.450)
pH, Arterial: 7.344 — ABNORMAL LOW (ref 7.350–7.450)
pO2, Arterial: 67 mmHg — ABNORMAL LOW (ref 83.0–108.0)
pO2, Arterial: 71 mmHg — ABNORMAL LOW (ref 83.0–108.0)

## 2021-07-02 LAB — FIBRINOGEN: Fibrinogen: 515 mg/dL — ABNORMAL HIGH (ref 210–475)

## 2021-07-02 LAB — LACTATE DEHYDROGENASE: LDH: 235 U/L — ABNORMAL HIGH (ref 98–192)

## 2021-07-02 LAB — PREPARE RBC (CROSSMATCH)

## 2021-07-02 LAB — PROTIME-INR
INR: 1.2 (ref 0.8–1.2)
Prothrombin Time: 15.6 seconds — ABNORMAL HIGH (ref 11.4–15.2)

## 2021-07-02 MED ORDER — IOHEXOL 350 MG/ML SOLN
75.0000 mL | Freq: Once | INTRAVENOUS | Status: AC | PRN
  Administered 2021-07-02: 75 mL via INTRAVENOUS

## 2021-07-02 MED ORDER — POLYETHYLENE GLYCOL 3350 17 G PO PACK
17.0000 g | PACK | Freq: Two times a day (BID) | ORAL | Status: DC
  Administered 2021-07-03 – 2021-07-04 (×2): 17 g
  Filled 2021-07-02 (×2): qty 1

## 2021-07-02 MED ORDER — SODIUM CHLORIDE 0.9% IV SOLUTION: Freq: Once | INTRAVENOUS | Status: DC

## 2021-07-02 MED ORDER — ALBUMIN HUMAN 25 % IV SOLN
12.5000 g | Freq: Once | INTRAVENOUS | Status: AC
  Administered 2021-07-02: 12.5 g via INTRAVENOUS
  Filled 2021-07-02: qty 50

## 2021-07-02 MED ORDER — POLYETHYLENE GLYCOL 3350 17 G PO PACK
17.0000 g | PACK | ORAL | Status: AC
  Administered 2021-07-02 – 2021-07-03 (×3): 17 g
  Filled 2021-07-02 (×3): qty 1

## 2021-07-02 NOTE — Progress Notes (Addendum)
Patient ID: Chris [name]., male   DOB: 12-08-1965, 55 y.o.   MRN: [id number]     Advanced Heart Failure Rounding Note  PCP-Cardiologist: Glenetta Hew, MD   Subjective:    11/7: VV ECMO cannulation 11/10: Tracheostomy 11/14: Lurline Idol exchange 11/17: EGD for UGIB, APC duodenal AVM.  Trach aspirate 11/17 with Pseudomonas and MSSA, 11/13 trach aspirate still with Pseudomonas.  Oseltamivir for influenza completed. 11/20: Sweep trial.  Repeat EGD with no new lesion.   11/21: VV ECMO decannulation.   He remains on FiO2 0.8.   He is off bivalirudin with ongoing GI bleeding.  Hgb 7.7 today.    On NE 33 and VP 0.04, stable MAP this morning. Continue amiodarone gtt at 30 mg/hr, HR 100s.   Na 154, free water boluses 300 q4 hrs.   I/Os positive on Lasix 80 mg IV every 8 hrs. BUN/creatinine rising, 138/216.   Objective:   Weight Range: 117.8 kg Body mass index is 38.35 kg/m.   Vital Signs:   Temp:  [98.3 F (36.8 C)-99.7 F (37.6 C)] 99.4 F (37.4 C) (11/22 0648) Pulse Rate:  [101-127] 112 (11/22 0648) Resp:  [0-42] 21 (11/22 0715) BP: (98-130)/(43-57) 119/53 (11/22 0648) SpO2:  [90 %-99 %] 96 % (11/22 0715) Arterial Line BP: (82-146)/(43-61) 118/52 (11/22 0715) FiO2 (%):  [70 %-100 %] 80 % (11/22 0402) Weight:  [117.8 kg] 117.8 kg (11/22 0530) Last BM Date: 07/01/21  Weight change: Filed Weights   06/26/2021 0230 07/01/21 0630 07/02/21 0530  Weight: 116 kg 118.2 kg 117.8 kg    Intake/Output:   Intake/Output Summary (Last 24 hours) at 07/02/2021 0740 Last data filed at 07/02/2021 0500 Gross per 24 hour  Intake 6270.86 ml  Output 3400 ml  Net 2870.86 ml      Physical Exam    General: NAD, trach Neck: JVP difficult, no thyromegaly or thyroid nodule.  Lungs: Distant BS CV: Nondisplaced PMI.  Heart regular S1/S2, no S3/S4, no murmur.  Trace ankle edema.  Abdomen: Soft, nontender, no hepatosplenomegaly, no distention.  Skin: Intact without lesions or rashes.   Neurologic: Sedated on vent.  Extremities: No clubbing or cyanosis.  HEENT: Normal.    Telemetry   Atrial fibrillation 100s. Personally reviewed  Labs    CBC Recent Labs    07/02/21 0312 07/02/21 0335 07/02/21 0512 07/02/21 0555  WBC 11.0*  --  10.8*  --   HGB 8.2*   < > 7.7* 7.1*  HCT 26.8*   < > 25.3* 21.0*  MCV 96.1  --  95.8  --   PLT 92*  --  90*  --    < > = values in this interval not displayed.   Basic Metabolic Panel Recent Labs    07/01/21 2133 07/02/21 0312 07/02/21 0335 07/02/21 0555  NA 154* 154* 154* 154*  K 4.5 4.3 4.3 4.4  CL 116* 116*  --   --   CO2 30 32  --   --   GLUCOSE 211* 175*  --   --   BUN 139* 138*  --   --   CREATININE 2.02* 2.16*  --   --   CALCIUM 7.7* 7.9*  --   --    Liver Function Tests Recent Labs    07/01/21 0318 07/02/21 0312  AST 42* 50*  ALT 45* 47*  ALKPHOS 29* 33*  BILITOT 0.5 0.5  PROT 4.8* 5.5*  ALBUMIN <1.5* <1.5*   No results for input(s): LIPASE, AMYLASE in  the last 72 hours. Cardiac Enzymes No results for input(s): CKTOTAL, CKMB, CKMBINDEX, TROPONINI in the last 72 hours.  BNP: BNP (last 3 results) Recent Labs    10/29/20 1214  BNP 102.9*    ProBNP (last 3 results) No results for input(s): PROBNP in the last 8760 hours.   D-Dimer No results for input(s): DDIMER in the last 72 hours. Hemoglobin A1C No results for input(s): HGBA1C in the last 72 hours. Fasting Lipid Panel No results for input(s): CHOL, HDL, LDLCALC, TRIG, CHOLHDL, LDLDIRECT in the last 72 hours.  Thyroid Function Tests No results for input(s): TSH, T4TOTAL, T3FREE, THYROIDAB in the last 72 hours.  Invalid input(s): FREET3  Other results:   Imaging    DG CHEST PORT 1 VIEW  Result Date: 07/02/2021 CLINICAL DATA:  Tracheostomy.  Hypoxia.  Recent ECMO. EXAM: PORTABLE CHEST 1 VIEW COMPARISON:  07/01/2021. FINDINGS: Interim removal of ECMO device. Endotracheal tube, NG tube, left IJ line in stable position. Cardiomegaly  with mild pulmonary venous congestion. Mild bibasilar atelectasis and infiltrates again noted unchanged from prior exam. No pleural effusion or pneumothorax. IMPRESSION: 1. Interim removal of ECMO device. Remaining lines and tubes in stable position. 2.  Cardiomegaly with mild pulmonary venous congestion. 3. Mild bibasilar atelectasis and infiltrates again noted unchanged from prior exam. Electronically Signed   By: Marcello Moores  Register M.D.   On: 07/02/2021 06:41     Medications:     Scheduled Medications:  sodium chloride   Intravenous Once   arformoterol  15 mcg Nebulization BID   budesonide (PULMICORT) nebulizer solution  0.25 mg Nebulization BID   chlorhexidine gluconate (MEDLINE KIT)  15 mL Mouth Rinse BID   Chlorhexidine Gluconate Cloth  6 each Topical Daily   clonazePAM  1.5 mg Per Tube TID   collagenase   Topical Daily   emtricitabine-tenofovir AF  1 tablet Per Tube Daily   And   dolutegravir  50 mg Per Tube Daily   feeding supplement (PROSource TF)  45 mL Per Tube TID   free water  300 mL Per Tube Q4H   furosemide  80 mg Intravenous TID   ipratropium-albuterol  3 mL Nebulization Q4H   mouth rinse  15 mL Mouth Rinse 10 times per day   nutrition supplement (JUVEN)  1 packet Per Tube BID BM   oxyCODONE  15 mg Per Tube Q6H   pantoprazole (PROTONIX) IV  40 mg Intravenous Q12H   polyethylene glycol  17 g Per Tube BID   QUEtiapine  100 mg Per Tube BID   revefenacin  175 mcg Nebulization Daily   rosuvastatin  20 mg Per Tube Daily   senna-docusate  1 tablet Per Tube BID   sodium chloride flush  10-40 mL Intracatheter Q12H   sodium chloride flush  3 mL Intravenous Q12H   valproic acid  250 mg Per Tube TID    Infusions:  sodium chloride     sodium chloride 10 mL/hr at 06/23/2021 1502   sodium chloride     sodium chloride Stopped (06/20/2021 1341)   sodium chloride     amiodarone 30 mg/hr (07/02/21 0500)   dexmedetomidine (PRECEDEX) IV infusion 0.9 mcg/kg/hr (07/02/21 0620)   feeding  supplement (PIVOT 1.5 CAL) 1,000 mL (07/02/21 0458)   HYDROmorphone 1 mg/hr (07/02/21 0500)   insulin 7.5 Units/hr (07/02/21 0500)   norepinephrine (LEVOPHED) Adult infusion 33 mcg/min (07/02/21 0618)   vasopressin 0.04 Units/min (07/02/21 0619)    PRN Medications: sodium chloride, Place/Maintain arterial line **AND**  sodium chloride, acetaminophen **OR** acetaminophen, albuterol, bisacodyl, dextrose, docusate, HYDROmorphone, lip balm, midazolam, ondansetron **OR** ondansetron (ZOFRAN) IV, sodium chloride flush, sodium chloride flush   Assessment/Plan   1. Acute hypoxemic and hypercarbic respiratory failure: In setting of COPD on home oxygen at baseline.  He has severe influenza infection.  Refractory hypoxemia and hypercarbia with maximal management.  He has underlying significant lung disease but has reasonable quality of life and had ARDS in the past requiring tracheostomy but was able to wean off.  RESP score was acceptable.  VV ECMO cannulation on 11/7.  Pseudomonas and MSSA in trach aspirate from 11/7.  Tracheostomy 11/10.  ECMO decannulation 11/21.  He remains on vent FiO2 0.8.  I/Os positive but BUN/creatinine rising.   - Lasix 80 mg IV every 8 hrs today. Set up CVP line.  - Cefepime course has been completed.  - Anticipate prolonged course with trach.  2. Atrial fibrillation: He has been in atrial fibrillation on all ECGs since 2021, suspect chronic at this point. On Eliquis at home. Soft BP has limited metoprolol. Rate controlled amiodarone gtt.   - Holding bivalirudin for now with active GIB bleed  - Continue amiodarone gtt 30 mg/hr.    - Consider cardioversion in future.  3.  AKI: Creatinine up to 2.16 with BUN 138.  BUN likely combination of upper GI bleeding and AKI.  - Lasix 80 mg IV every 8 hrs today.  - He will be getting abdominal CTA for GI bleeding, think his risk of progressing to the need for RRT is significant.  4. Type 2 DM: Still on insulin gtt.  5. Influenza:  Severe.  Completed oseltamivir.  6. COPD: Still smokes 1-2 cigs/day.  Intermittent home oxygen.  Followed by pulmonary, has been stable at home for years but baseline DLCO was very low.  7. HIV: Last viral load was undetectable.  On treatment.  8. Shock: Septic + hemorrhagic.  Echo showed EF 55-60% with normal RV. This morning on NE 33, vasopressin 0.04. Pressor need appears to track with GI bleeding.  - Continue to wean pressors as able.  9. FEN: Tube feeds.  10. 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 Repeat EGD 11/20 without new lesion, suspect deeper AVM in small bowel.  Now off bivalirudin given rebleeding when restarted. He is having ongoing maroon stool.  - BID PPI - Keep hgb > 8.0, getting PRBCs this morning.  - GI did not recommend octreotide.  - Plan noted for CTA abdomen/pelvis today given ongoing bleeding without known source.  ?IR embolization.  11. Hypernatremia: Na 154 today. - Increase free water to 300 q4.    12. Severe protein/calorie malnutrition - albumin < 1.5 - resume TFs when able  CRITICAL CARE Performed by: Loralie Champagne  Total critical care time: 40 minutes  Critical care time was exclusive of separately billable procedures and treating other patients.  Critical care was necessary to treat or prevent imminent or life-threatening deterioration.  Critical care was time spent personally by me on the following activities: development of treatment plan with patient and/or surrogate as well as nursing, discussions with consultants, evaluation of patient's response to treatment, examination of patient, obtaining history from patient or surrogate, ordering and performing treatments and interventions, ordering and review of laboratory studies, ordering and review of radiographic studies, pulse oximetry and re-evaluation of patient's condition.     Length of Stay: [place], MD  07/02/2021, 7:40 AM  Advanced Heart Failure Team  Pager [phone] (M-F;  [place])  Please contact [facility] for night-coverage after hours (5p -7a ) and weekends on [email or web address]

## 2021-07-02 NOTE — Progress Notes (Signed)
Daily Rounding Note  07/02/2021, 1:48 PM  LOS: 19 days   SUBJECTIVE:   Chief complaint: Melena, GI bleed. Remains sedated on vent.   Got another PRBC this AM.  Remains on pressors, multiple drips.  Stool to flexi-seal still melenic, liquid.    OBJECTIVE:         Vital signs in last 24 hours:    Temp:  [98.3 F (36.8 C)-99.7 F (37.6 C)] 99 F (37.2 C) (11/22 1000) Pulse Rate:  [110-123] 110 (11/22 1000) Resp:  [0-42] 22 (11/22 1330) BP: (102-130)/(51-57) 119/53 (11/22 0648) SpO2:  [72 %-100 %] 94 % (11/22 1116) Arterial Line BP: (82-146)/(46-61) 115/51 (11/22 1330) FiO2 (%):  [70 %-80 %] 70 % (11/22 1116) Weight:  [117.8 kg] 117.8 kg (11/22 0530) Last BM Date: 07/01/21 Filed Weights   07/02/2021 0230 07/01/21 0630 07/02/21 0530  Weight: 116 kg 118.2 kg 117.8 kg   General: Looks ill, sedated on vent. Heart: RRR Chest: Steady breathing on vent. Abdomen: Distended/obese.  Soft.  Palpation does not elicit any signs of tenderness.  Bowel sounds hypoactive. Extremities: Lower extremity edema. Neuro/Psych: Sedated, unresponsive to exam.  Did not apply noxious stimuli.  Intake/Output from previous day: 11/21 0701 - 11/22 0700 In: 6920.9 [I.V.:1852.1; Blood:516; NG/GT:4160; IV Piggyback:242.8] Out: 3400 [Urine:2900; Stool:500]  Intake/Output this shift: Total I/O In: 1546.8 [I.V.:882.8; Blood:384; NG/GT:280] Out: 2000 [Urine:1900; Stool:100]  Lab Results: Recent Labs    07/02/21 0312 07/02/21 0335 07/02/21 0512 07/02/21 0555 07/02/21 1200  WBC 11.0*  --  10.8*  --  8.9  HGB 8.2*   < > 7.7* 7.1* 7.6*  HCT 26.8*   < > 25.3* 21.0* 24.8*  PLT 92*  --  90*  --  91*   < > = values in this interval not displayed.   BMET Recent Labs    07/01/21 1541 07/01/21 1624 07/01/21 2133 07/02/21 0312 07/02/21 0335 07/02/21 0555  NA 154*   < > 154* 154* 154* 154*  K 4.4   < > 4.5 4.3 4.3 4.4  CL 117*  --  116* 116*   --   --   CO2 30  --  30 32  --   --   GLUCOSE 191*  --  211* 175*  --   --   BUN 138*  --  139* 138*  --   --   CREATININE 2.01*  --  2.02* 2.16*  --   --   CALCIUM 7.6*  --  7.7* 7.9*  --   --    < > = values in this interval not displayed.   LFT Recent Labs    06/16/2021 0351 07/01/21 0318 07/02/21 0312  PROT 5.1* 4.8* 5.5*  ALBUMIN <1.5* <1.5* <1.5*  AST 57* 42* 50*  ALT 59* 45* 47*  ALKPHOS 31* 29* 33*  BILITOT 0.5 0.5 0.5  BILIDIR <0.1 <0.1 <0.1  IBILI NOT CALCULATED NOT CALCULATED NOT CALCULATED   PT/INR Recent Labs    07/01/21 0318 07/02/21 0312  LABPROT 16.7* 15.6*  INR 1.4* 1.2   Hepatitis Panel No results for input(s): HEPBSAG, HCVAB, HEPAIGM, HEPBIGM in the last 72 hours.  Studies/Results: DG CHEST PORT 1 VIEW  Result Date: 07/02/2021 CLINICAL DATA:  Tracheostomy.  Hypoxia.  Recent ECMO. EXAM: PORTABLE CHEST 1 VIEW COMPARISON:  07/01/2021. FINDINGS: Interim removal of ECMO device. Endotracheal tube, NG tube, left IJ line in stable position. Cardiomegaly with mild pulmonary venous congestion. Mild  bibasilar atelectasis and infiltrates again noted unchanged from prior exam. No pleural effusion or pneumothorax. IMPRESSION: 1. Interim removal of ECMO device. Remaining lines and tubes in stable position. 2.  Cardiomegaly with mild pulmonary venous congestion. 3. Mild bibasilar atelectasis and infiltrates again noted unchanged from prior exam. Electronically Signed   By: Maisie Fus  Register M.D.   On: 07/02/2021 06:41   DG CHEST PORT 1 VIEW  Result Date: 07/01/2021 CLINICAL DATA:  Difficulty breathing EXAM: PORTABLE CHEST 1 VIEW COMPARISON:  Previous studies including the examination of 06/11/2021 FINDINGS: Transverse diameter of heart is within normal limits. Patchy infiltrates are seen in both lower lung fields, more so on the right side with interval worsening. There is prominence of interstitial markings in the parahilar regions. Tip of tracheostomy is proximally 9 cm  above the carina. Enteric tube is noted traversing the esophagus. Tip of left jugular intravenous catheter is seen in the region of superior vena cava. There is a large caliber right jugular catheter, ECMO cannula with its distal course at the junction of inferior vena cava and right atrium. IMPRESSION: There are patchy infiltrates in both lower lung fields, more so on the right side with interval worsening suggesting atelectasis/pneumonia. Other findings as described in the body of the report. Electronically Signed   By: Ernie Avena M.D.   On: 07/01/2021 08:29   CT Angio Abd/Pel w/ and/or w/o  Result Date: 07/02/2021 CLINICAL DATA:  GI bleed. EXAM: CTA ABDOMEN AND PELVIS WITHOUT AND WITH CONTRAST TECHNIQUE: Multidetector CT imaging of the abdomen and pelvis was performed using the standard protocol during bolus administration of intravenous contrast. Multiplanar reconstructed images and MIPs were obtained and reviewed to evaluate the vascular anatomy. CONTRAST:  28mL OMNIPAQUE IOHEXOL 350 MG/ML SOLN COMPARISON:  Chest XR, 07/02/2021. KUB, 07/02/2021. Renal ultrasound report, 10/08/2017. CTA PE, 07/23/2017. FINDINGS: The exam is moderately degraded secondary to streak artifact and relatively poor contrast opacification. VASCULAR Aorta: Mild-to-moderate calcified and noncalcified aortic atherosclerosis. No aneurysm, dissection, vasculitis or significant stenosis. Celiac: Patent without evidence of aneurysm, dissection, vasculitis or significant stenosis. SMA: Variant anatomy, with replaced RIGHT hepatic artery. Focal intimal thickening of the proximal SMA, in a 3 cm segment (see key image). No significant (< 50%) stenosis. Renals: Single RIGHT renal artery. Two LEFT renal arteries, with small inferior pole accessory. The renal arteries are patent without evidence of aneurysm, dissection, vasculitis, fibromuscular dysplasia or significant stenosis. IMA: Patent without evidence of aneurysm, dissection,  vasculitis or significant stenosis. Inflow: Patent without evidence of aneurysm, dissection, vasculitis or significant stenosis. Proximal Outflow: Bilateral common femoral and visualized portions of the superficial and profunda femoral arteries are patent without evidence of aneurysm, dissection, vasculitis or significant stenosis. Veins: No obvious venous abnormality within the limitations of this arterial phase study. Review of the MIP images confirms the above findings. NON-VASCULAR Lower chest: Bibasilar dependent consolidations. Hepatobiliary: No focal liver abnormality is seen. Mild gallbladder distention. No gallstones, gallbladder wall thickening, or biliary dilatation. Pancreas: Unremarkable. No pancreatic ductal dilatation or surrounding inflammatory changes. Spleen: Normal in size without focal abnormality. Adrenals/Urinary Tract: Adrenal glands are unremarkable. Kidneys are normal, without renal calculi, focal lesion, or hydronephrosis. Bladder is decompressed by Foley catheter. Non dependent intraluminal air, likely from catheterization. Urachal remnant. Stomach/Bowel: Enteric feeding tube with transpyloric tip, within the proximal duodenum. Stomach is within normal limits. Appendix appears normal. Nonobstructed small bowel. Nondilated colon. Fecal management system within rectum. No evidence of bowel wall thickening, distention, or inflammatory changes. Lymphatic: No enlarged abdominal  or pelvic lymph nodes. Reproductive: Prostate is unremarkable. Other: Small fat-containing paraumbilical hernia. No abdominopelvic ascites. Musculoskeletal: No acute osseous findings. RIGHT proximal femoral PMN fixation. IMPRESSION: Suboptimal evaluation, within these constraints; VASCULAR 1. No CTA evidence of acute active extravasation to explain the patient's reported gastrointestinal bleeding. 2. Focal intraluminal thickening of the proximal SMA, as above, without CTA evidence of hemodynamically significant stenosis.  No evidence of vascular compromise to bowel. NON-VASCULAR 1. Bibasilar dependent consolidations are suspicious for pneumonia. 2. NG tube with transpyloric tip within proximal duodenum. If gastric placement was desired, recommend retraction for appropriate placement. 3. Additional incidental and senescent findings, as above. Michaelle Birks, MD [facility] Electronically Signed   By: Michaelle Birks M.D.   On: 07/02/2021 09:23    .meds  ASSESMENT:   Upper GI bleed: melena, hemodynamic instability, anemia. 06/17/2021 EGD #1.  Briskly oozing AVM at duodenal bulb destroyed with APC cautery.  Multiple superficial duodenal ulcers beginning at D2 extending into examined duodenum, tissue appeared viable but suggestive of ischemic injury. 06/12/2021 EGD #2.  Post intervention superficial ulcer no active bleeding at site of recently ablated AVM.  No fresh or old blood. 07/01/2021 CT angio abd/pelvis: No evidence of active GI bleeding.  Focal intraluminal thickening of proximal SMA but no significant stenosis.  NGT with tip in proximal duodenum.  Bilateral basilar lung consolidations, suspicious for pneumonia.      Blood loss anemia.  Hgb 6.8 yest AM >> 2 PRBC >> up to 9.2 by 1630 >> down to 7.1 today,  s/p 19th PRBC >> 7.6.  Total PRBCs: 19 units from 11/9 - today.     Chronic Eliquis for A. fib, currently on hold.   Acute on chronic hypoxic, hypercapnic respiratory failure in setting of severe flu with pneumonia.  MSSA, pseudomonal HAP.  Oxygen dependent COPD, emphysema at baseline, previous tracheostomy 2019 ultimately decannulated.     HIV +.  On meds per ID     DM       AKI     Mild elevation transaminases, overall improving..  Hepatic steatosis on ultrasound in 08/2016     Severe Prot cal malnutrition.  TF in  place.     PLAN   Per Dr. Tarri Glenn. ? Drop Capsule for VCE?     Azucena Freed  07/02/2021, 1:48 PM Phone [phone]

## 2021-07-02 NOTE — Progress Notes (Signed)
eLink Physician-Brief Progress Note Patient Name: Chris [name]. DOB: 11-07-1965 MRN: [id number]   Date of Service  07/02/2021  HPI/Events of Note  Hg 7.7 this am. Goal 9 per notes  eICU Interventions  Transfuse 1 U PRBC now        Francelia Mclaren Mechele Collin 07/02/2021, 5:39 AM

## 2021-07-02 NOTE — Progress Notes (Signed)
NAME:  Chris Rill., MRN:  [id number], DOB:  04/18/1966, LOS: 19 ADMISSION DATE:  07/10/2021, CONSULTATION DATE:  06/15/2019 REFERRING MD:  Gaynell Face, CHIEF COMPLAINT:  hypercapnic respiratory failure   History of Present Illness:   55 year old man with O2 dependent COPD presenting with acute on chronic respiratory failure now on VV ECMO due to refractory hypoxia and hypercapnia.  Pertinent  Medical History   Past Medical History:  Diagnosis Date   Anxiety    Atrial fibrillation (HCC)    CHF (congestive heart failure) (HCC)    COPD (chronic obstructive pulmonary disease) (HCC)    Emphysema [[name]]   Depression    Diabetes mellitus without complication (HCC)    type 2   Emphysema of lung (HCC)    GERD (gastroesophageal reflux disease)    HIV disease (HCC)    Hypertension    Oxygen deficiency     Significant Hospital Events: Including procedures, antibiotic start and stop dates in addition to other pertinent events   11/7- VV ECMO started 11/9 - weaning sedation. Transfused 2 units PRBC to keep O2 carrying capacity.  11/9 - remains agitated with wake up  11/10 - tracheostomy tube placed 11/14 - trach exchanged to distal XLT due to cuff leak 11/17  - weaned sweep down to 1.5lpm prior to developing melena 11/17  - EGD bleeding duodenal bulb AVM injected and cauterized. Duodenal bulb ischemia. 11/19 stool became maroon.  No improvement in hemoglobin despite 4 units of blood.  Increasing vasopressor requirements.  Despite this, tolerated sweep trial. 11/20 off bivalirudin, maroon stools have resolved.  Repeat endoscopy shows no active signs of bleeding.   11/21 Decannulated from ECMO 11/22 Continues to have recurrent bleeding.   Interim History / Subjective:   Increasing vasopressors overnight with now maroon stools.   Objective   Blood pressure (!) 119/53, pulse (!) 112, temperature 99.4 F (37.4 C), temperature source Oral, resp. rate (!) 22, height 5\' 9"  (1.753 m),  weight 117.8 kg, SpO2 96 %.    Vent Mode: PCV FiO2 (%):  [70 %-100 %] 80 % Set Rate:  [15 bmp] 15 bmp PEEP:  [12 cmH20] 12 cmH20 Pressure Support:  [18 cmH20-22 cmH20] 18 cmH20 Plateau Pressure:  [30 cmH20-31 cmH20] 30 cmH20   Intake/Output Summary (Last 24 hours) at 07/02/2021 0716 Last data filed at 07/02/2021 0500 Gross per 24 hour  Intake 6270.86 ml  Output 2800 ml  Net 3470.86 ml    Filed Weights   07/02/2021 0230 07/01/21 0630 07/02/21 0530  Weight: 116 kg 118.2 kg 117.8 kg    Examination: General: critically ill appearing man lying in bed trached, sedated, HEENT: [place]/AT, eyes anicteric Neck: no bleeding or drainage around trach, Shiley cuffed distal XLT #8 Resp:  large breaths on PSV. Rhonchi at both bases Cardio: S1S2, irreg rhythm, reg rate GI: abdomen soft with maroon effluent from flexiseal  Neuro: RASS -4, intact cough reflex, PERRL Extremities: moderate dependent edema, no cyanosis GU: yellow urine, foley Derm: warm, dry, no rashes or ecchymoses  Ancillary tests:    CXR reviewed and shows both lung fields clear with cannula in good position.  Bibasilar atelectasis.  ABG: mild compensated respiratory acidosis.  Leukocytosis improved Sodium 157 Assessment & Plan:   Critically ill  due to acute on chronic hypoxic and hypercapnic respiratory failure from influenza pneumonia on background of severe COPD, prior tracheostomy. On HOT 10 years. Acute COPD exacerbation; severe underlying COPD- Pseudomonas and MSSA HAP Now trach dependent  Concern for undiagnosed OSA Acute metabolic encephalopathy with severe agitation HIV, undetectable VL Distributive and hemorrhagic shock due to sedation Acute blood loss anemia due to acute bleeding duodenal AVM Chronic Afib w/RVR H/o HFpEF GERD Type 2 diabetes with hyperglycemia Hypernatremia from high insensitive losses Deconditioning, acute on chronic  Plan:   -Doing well off ECMO - wean FiO2 as tolerated.  - Needs CT  angio for ongoing GI bleeding.  - GI bleed likely driving vasopressor requirements and weaning sedation and FiO2 difficult.  - Continue free water - Transfuse as necessary to keep HB> 9.0 - Continue ART.   Best Practice (right click and "Reselect all SmartList Selections" daily)   Diet/type: tubefeeds  DVT prophylaxis: bivalirudin GI prophylaxis: PPI Lines: Central line and Arterial Line Foley:  external catheter Code Status:  full code Last date of multidisciplinary goals of care discussion: continue for family updates   CRITICAL CARE Performed by: Kipp Brood  Total critical care time: 40 minutes  Critical care time was exclusive of separately billable procedures and treating other patients.  Critical care was necessary to treat or prevent imminent or life-threatening deterioration.  Critical care was time spent personally by me on the following activities: development of treatment plan with patient and/or surrogate as well as nursing, discussions with consultants, evaluation of patient's response to treatment, examination of patient, obtaining history from patient or surrogate, ordering and performing treatments and interventions, ordering and review of laboratory studies, ordering and review of radiographic studies, pulse oximetry, re-evaluation of patient's condition and participation in multidisciplinary rounds.  Kipp Brood, MD [name] ICU Physician [place]  Pager: [phone] Mobile: [phone] After hours: [phone].

## 2021-07-02 NOTE — Progress Notes (Signed)
Per Dr Shirlee Latch  Heart failure team will sign off as of 07/02/21   Please reconsult if needed.    Alper Guilmette NP-C  2:00 PM

## 2021-07-02 NOTE — Progress Notes (Signed)
Inpatient Diabetes Program Recommendations  AACE/[name]: New Consensus Statement on Inpatient Glycemic Control (2015)  Target Ranges:  Prepandial:   less than 140 mg/dL      Peak postprandial:   less than 180 mg/dL (1-2 hours)      Critically ill patients:  140 - 180 mg/dL   Lab Results  Component Value Date   GLUCAP 136 (H) 07/02/2021   HGBA1C 6.8 (H) 07/09/2021    Review of Glycemic Control  Latest Reference Range & Units 07/02/21 01:39 07/02/21 03:30 07/02/21 05:31 07/02/21 06:23 07/02/21 07:41 07/02/21 07:44 07/02/21 09:14  Glucose-Capillary 70 - 99 mg/dL 355 (H) 732 (H) 202 (H) 141 (H) 29 (LL) 173 (H) 136 (H)   Diabetes history: DM 2 Home DM Meds: Metformin 1000 mg BID                             Victoza 1.8 mg daily   Current Orders: IV Insulin Drip  Inpatient Diabetes Program Recommendations:    Agree with insulin drip.   Thanks,  Beryl Meager, RN, BC-ADM Inpatient Diabetes Coordinator Pager [phone]  (8a-5p)

## 2021-07-02 NOTE — Progress Notes (Signed)
Palliative:  I met today at Jru's bedside x 2. No family present. He is sedated on vent. FiO2 70%. Concern for bleeding and hypotension. Last I spoke with wife goals are to continue full code, full scope care.   No charge  Vinie Sill, NP Palliative Medicine Team Pager [phone] (Please see [email or web address] for schedule) Team Phone [phone]

## 2021-07-02 NOTE — Progress Notes (Signed)
PCCM INTERVAL PROGRESS NOTE  Called to bedside regarding increasing pressor requirements.   Briefly this is a 55 year old male with oxygen dependent COPD admitted with influenza pneumonia. Ultimately required VV ECMO. Decannulated 11/21. Course also complicated by GI bleed, found to be AVM which was clipped. Bleeding continued. EDG re-explored 11/20 with no obvious bleeding source. Pressor requirement norepinephrine at change of shift, now at 38.   On my arrival patient in no distress. MAP 71. Ventilator settings adjusted to correct tidal volume on 950 mL on pressure control 22/12. ABG sent. Patient remains in AF RVR with BP very sensitive to rapid rate. Amiodarone infusing. Wondering if he is intravascularly dry. 11L positive for admission with ongoing diuresis. Third spacing of fluid.   General:  middle aged male on vent via trach Neuro:  Sedated HEENT:  [place]/AT, No JVD noted, PERRL Cardiovascular: Tachy. IRIR, no MRG Lungs: Coarse throughout Abdomen:  Soft, non-distended Musculoskeletal:  No acute deformity. Significant anasarca.  Skin:  Intact, MMM  Shock: hypovolemic/hemorrhagic vs distributive.  - Continue Norepi, vasopressin for MAP goal 65 mmHg - CBC pending, may need transfusion. Hemoglobin goal 9 per rounding note.  - Give one dose albumin 25% - Check CVP - CTA pending for GIB workup with ongoing melena.  - Repeat CXR with rising fevers to ensure no worsening infection  Acute on chronic hypoxemic respiratory failure secondary to pneumonia - vent settings reviewed and adjusted - Repeat ABG 6 AM  Atrial fib with RVR - continue amiodarone.     Joneen Roach, AGACNP-BC [facility] [place] [facility]  See Amion for personal pager PCCM on call pager [phone] until 7pm. Please call Elink 7p-7a. [phone]  07/02/2021 3:42 AM

## 2021-07-02 NOTE — Progress Notes (Addendum)
eLink Physician-Brief Progress Note Patient Name: Chris [name]. DOB: [birth year] MRN: [id number]   Date of Service  07/02/2021  HPI/Events of Note  Worsening hypotension this shift Patient decannulated yesterday 11/21 Hgb stable post-transfusion ~8-9  eICU Interventions  Increased levophed max to 50 mcg/h RN to notify ECMO team  Unable to obtain CT A/P due to hemodynamic instability  3:13 Hypotension worsening. Now at Levo 42 mcg/h. Contacted ground team to assess at bedside  AM restraints expired - Renewed       Intervention Category Major Interventions: Shock - evaluation and management  Misti Towle Mechele Collin 07/02/2021, 1:20 AM

## 2021-07-02 NOTE — Progress Notes (Signed)
PT Cancellation Note  Patient Details Name: Chris [name]. MRN: [id number] DOB: 01-04-1966   Cancelled Treatment:    Reason Eval/Treat Not Completed: Patient not medically ready. Pt remains very sedated on vent. If not medically ready in the next day or so will sign off and wait for a reorder.    Angelina Ok [facility] 07/02/2021, 10:00 AM Skip Mayer PT Acute Rehabilitation Services Pager [phone] Office [phone]

## 2021-07-02 NOTE — Progress Notes (Signed)
Pt transported on vent from 2H03 to CT and back with out any complications. Vitals stable, RN at bedside, RT will continue to monitor.

## 2021-07-02 NOTE — Progress Notes (Signed)
Received verbal orders to increase ceiling rate on Levo,see orders, and instructed to notify bedside nurse to notify ECMO team of current situation due to recent decannulation.  Nurse verbalized understanding, orders for CT were also modified as charted.

## 2021-07-03 ENCOUNTER — Encounter (HOSPITAL_COMMUNITY): Payer: Self-pay | Admitting: Anesthesiology

## 2021-07-03 ENCOUNTER — Encounter (HOSPITAL_COMMUNITY): Admission: EM | Disposition: E | Payer: Self-pay | Source: Home / Self Care | Attending: Pulmonary Disease

## 2021-07-03 ENCOUNTER — Inpatient Hospital Stay (HOSPITAL_COMMUNITY): Payer: Medicare HMO

## 2021-07-03 DIAGNOSIS — K921 Melena: Secondary | ICD-10-CM | POA: Diagnosis not present

## 2021-07-03 DIAGNOSIS — K922 Gastrointestinal hemorrhage, unspecified: Secondary | ICD-10-CM | POA: Diagnosis not present

## 2021-07-03 DIAGNOSIS — D500 Iron deficiency anemia secondary to blood loss (chronic): Secondary | ICD-10-CM | POA: Diagnosis not present

## 2021-07-03 HISTORY — PX: GIVENS CAPSULE STUDY: SHX5432

## 2021-07-03 HISTORY — PX: ESOPHAGOGASTRODUODENOSCOPY: SHX5428

## 2021-07-03 LAB — LACTATE DEHYDROGENASE: LDH: 254 U/L — ABNORMAL HIGH (ref 98–192)

## 2021-07-03 LAB — POCT I-STAT 7, (LYTES, BLD GAS, ICA,H+H)
Acid-Base Excess: 10 mmol/L — ABNORMAL HIGH (ref 0.0–2.0)
Acid-Base Excess: 5 mmol/L — ABNORMAL HIGH (ref 0.0–2.0)
Acid-Base Excess: 4 mmol/L — ABNORMAL HIGH (ref 0.0–2.0)
Bicarbonate: 37 mmol/L — ABNORMAL HIGH (ref 20.0–28.0)
Bicarbonate: 35.1 mmol/L — ABNORMAL HIGH (ref 20.0–28.0)
Bicarbonate: 33.8 mmol/L — ABNORMAL HIGH (ref 20.0–28.0)
Calcium, Ion: 1.2 mmol/L (ref 1.15–1.40)
Calcium, Ion: 1.13 mmol/L — ABNORMAL LOW (ref 1.15–1.40)
Calcium, Ion: 1.17 mmol/L (ref 1.15–1.40)
HCT: 22 % — ABNORMAL LOW (ref 39.0–52.0)
HCT: 17 % — ABNORMAL LOW (ref 39.0–52.0)
HCT: 21 % — ABNORMAL LOW (ref 39.0–52.0)
Hemoglobin: 7.5 g/dL — ABNORMAL LOW (ref 13.0–17.0)
Hemoglobin: 5.8 g/dL — CL (ref 13.0–17.0)
Hemoglobin: 7.1 g/dL — ABNORMAL LOW (ref 13.0–17.0)
O2 Saturation: 79 %
O2 Saturation: 93 %
O2 Saturation: 90 %
Patient temperature: 105
Patient temperature: 103.2
Patient temperature: 102.9
Potassium: 5 mmol/L (ref 3.5–5.1)
Potassium: 5 mmol/L (ref 3.5–5.1)
Potassium: 5.7 mmol/L — ABNORMAL HIGH (ref 3.5–5.1)
Sodium: 154 mmol/L — ABNORMAL HIGH (ref 135–145)
Sodium: 154 mmol/L — ABNORMAL HIGH (ref 135–145)
Sodium: 152 mmol/L — ABNORMAL HIGH (ref 135–145)
TCO2: 39 mmol/L — ABNORMAL HIGH (ref 22–32)
TCO2: 38 mmol/L — ABNORMAL HIGH (ref 22–32)
TCO2: 37 mmol/L — ABNORMAL HIGH (ref 22–32)
pCO2 arterial: 84.7 mmHg (ref 32.0–48.0)
pCO2 arterial: >110 mmHg (ref 32.0–48.0)
pCO2 arterial: 109.6 mmHg (ref 32.0–48.0)
pH, Arterial: 7.266 — ABNORMAL LOW (ref 7.350–7.450)
pH, Arterial: 7.086 — CL (ref 7.350–7.450)
pH, Arterial: 7.111 — CL (ref 7.350–7.450)
pO2, Arterial: 63 mmHg — ABNORMAL LOW (ref 83.0–108.0)
pO2, Arterial: 110 mmHg — ABNORMAL HIGH (ref 83.0–108.0)
pO2, Arterial: 94 mmHg (ref 83.0–108.0)

## 2021-07-03 LAB — CBC
HCT: 24.2 % — ABNORMAL LOW (ref 39.0–52.0)
HCT: 23.2 % — ABNORMAL LOW (ref 39.0–52.0)
Hemoglobin: 7.4 g/dL — ABNORMAL LOW (ref 13.0–17.0)
Hemoglobin: 6.7 g/dL — CL (ref 13.0–17.0)
MCH: 30.1 pg (ref 26.0–34.0)
MCH: 30 pg (ref 26.0–34.0)
MCHC: 30.6 g/dL (ref 30.0–36.0)
MCHC: 28.9 g/dL — ABNORMAL LOW (ref 30.0–36.0)
MCV: 98.4 fL (ref 80.0–100.0)
MCV: 104 fL — ABNORMAL HIGH (ref 80.0–100.0)
Platelets: 98 10*3/uL — ABNORMAL LOW (ref 150–400)
Platelets: 112 10*3/uL — ABNORMAL LOW (ref 150–400)
RBC: 2.46 MIL/uL — ABNORMAL LOW (ref 4.22–5.81)
RBC: 2.23 MIL/uL — ABNORMAL LOW (ref 4.22–5.81)
RDW: 18.7 % — ABNORMAL HIGH (ref 11.5–15.5)
RDW: 18 % — ABNORMAL HIGH (ref 11.5–15.5)
WBC: 7.6 10*3/uL (ref 4.0–10.5)
WBC: 9.5 10*3/uL (ref 4.0–10.5)
nRBC: 1.3 % — ABNORMAL HIGH (ref 0.0–0.2)
nRBC: 0.7 % — ABNORMAL HIGH (ref 0.0–0.2)

## 2021-07-03 LAB — HEPATIC FUNCTION PANEL
ALT: 66 U/L — ABNORMAL HIGH (ref 0–44)
AST: 94 U/L — ABNORMAL HIGH (ref 15–41)
Albumin: <1.5 g/dL — ABNORMAL LOW (ref 3.5–5.0)
Alkaline Phosphatase: 36 U/L — ABNORMAL LOW (ref 38–126)
Bilirubin, Direct: <0.1 mg/dL (ref 0.0–0.2)
Total Bilirubin: 0.6 mg/dL (ref 0.3–1.2)
Total Protein: 5.9 g/dL — ABNORMAL LOW (ref 6.5–8.1)

## 2021-07-03 LAB — GLUCOSE, CAPILLARY
Glucose-Capillary: 123 mg/dL — ABNORMAL HIGH (ref 70–99)
Glucose-Capillary: 134 mg/dL — ABNORMAL HIGH (ref 70–99)
Glucose-Capillary: 137 mg/dL — ABNORMAL HIGH (ref 70–99)
Glucose-Capillary: 137 mg/dL — ABNORMAL HIGH (ref 70–99)
Glucose-Capillary: 139 mg/dL — ABNORMAL HIGH (ref 70–99)
Glucose-Capillary: 140 mg/dL — ABNORMAL HIGH (ref 70–99)
Glucose-Capillary: 142 mg/dL — ABNORMAL HIGH (ref 70–99)
Glucose-Capillary: 145 mg/dL — ABNORMAL HIGH (ref 70–99)
Glucose-Capillary: 147 mg/dL — ABNORMAL HIGH (ref 70–99)
Glucose-Capillary: 147 mg/dL — ABNORMAL HIGH (ref 70–99)
Glucose-Capillary: 149 mg/dL — ABNORMAL HIGH (ref 70–99)
Glucose-Capillary: 151 mg/dL — ABNORMAL HIGH (ref 70–99)
Glucose-Capillary: 158 mg/dL — ABNORMAL HIGH (ref 70–99)
Glucose-Capillary: 159 mg/dL — ABNORMAL HIGH (ref 70–99)
Glucose-Capillary: 160 mg/dL — ABNORMAL HIGH (ref 70–99)
Glucose-Capillary: 164 mg/dL — ABNORMAL HIGH (ref 70–99)
Glucose-Capillary: 86 mg/dL (ref 70–99)

## 2021-07-03 LAB — BASIC METABOLIC PANEL
Anion gap: 5 (ref 5–15)
Anion gap: 6 (ref 5–15)
BUN: 123 mg/dL — ABNORMAL HIGH (ref 6–20)
BUN: 135 mg/dL — ABNORMAL HIGH (ref 6–20)
CO2: 33 mmol/L — ABNORMAL HIGH (ref 22–32)
CO2: 36 mmol/L — ABNORMAL HIGH (ref 22–32)
Calcium: 7.9 mg/dL — ABNORMAL LOW (ref 8.9–10.3)
Calcium: 8.2 mg/dL — ABNORMAL LOW (ref 8.9–10.3)
Chloride: 114 mmol/L — ABNORMAL HIGH (ref 98–111)
Chloride: 111 mmol/L (ref 98–111)
Creatinine, Ser: 2.04 mg/dL — ABNORMAL HIGH (ref 0.61–1.24)
Creatinine, Ser: 3.02 mg/dL — ABNORMAL HIGH (ref 0.61–1.24)
GFR, Estimated: 38 mL/min — ABNORMAL LOW (ref >60)
GFR, Estimated: 24 mL/min — ABNORMAL LOW (ref >60)
Glucose, Bld: 177 mg/dL — ABNORMAL HIGH (ref 70–99)
Glucose, Bld: 128 mg/dL — ABNORMAL HIGH (ref 70–99)
Potassium: 4.5 mmol/L (ref 3.5–5.1)
Potassium: 5.5 mmol/L — ABNORMAL HIGH (ref 3.5–5.1)
Sodium: 152 mmol/L — ABNORMAL HIGH (ref 135–145)
Sodium: 153 mmol/L — ABNORMAL HIGH (ref 135–145)

## 2021-07-03 LAB — FIBRINOGEN: Fibrinogen: 624 mg/dL — ABNORMAL HIGH (ref 210–475)

## 2021-07-03 LAB — PROTIME-INR
INR: 1.2 (ref 0.8–1.2)
Prothrombin Time: 15.6 seconds — ABNORMAL HIGH (ref 11.4–15.2)

## 2021-07-03 LAB — PREPARE RBC (CROSSMATCH)

## 2021-07-03 LAB — PATHOLOGIST SMEAR REVIEW

## 2021-07-03 SURGERY — IMAGING PROCEDURE, GI TRACT, INTRALUMINAL, VIA CAPSULE

## 2021-07-03 SURGERY — EGD (ESOPHAGOGASTRODUODENOSCOPY)
Anesthesia: Monitor Anesthesia Care

## 2021-07-03 MED ORDER — FENTANYL CITRATE (PF) 100 MCG/2ML IJ SOLN
INTRAMUSCULAR | Status: AC
  Filled 2021-07-03: qty 4

## 2021-07-03 MED ORDER — PIVOT 1.5 CAL PO LIQD
1000.0000 mL | ORAL | Status: DC
  Administered 2021-07-03: 1000 mL

## 2021-07-03 MED ORDER — AMIODARONE IV BOLUS ONLY 150 MG/100ML
150.0000 mg | Freq: Once | INTRAVENOUS | Status: AC
  Administered 2021-07-03: 150 mg via INTRAVENOUS

## 2021-07-03 MED ORDER — VANCOMYCIN HCL 10 G IV SOLR
2500.0000 mg | Freq: Once | INTRAVENOUS | Status: AC
  Administered 2021-07-03: 2500 mg via INTRAVENOUS
  Filled 2021-07-03: qty 2500

## 2021-07-03 MED ORDER — DIGOXIN 125 MCG PO TABS
0.1250 mg | ORAL_TABLET | Freq: Every day | ORAL | Status: DC
Start: 2021-07-04 — End: 2021-07-04
  Filled 2021-07-03: qty 1

## 2021-07-03 MED ORDER — HEPARIN SODIUM (PORCINE) 5000 UNIT/ML IJ SOLN
5000.0000 [IU] | Freq: Three times a day (TID) | INTRAMUSCULAR | Status: DC
  Administered 2021-07-03 – 2021-07-05 (×7): 5000 [IU] via SUBCUTANEOUS
  Filled 2021-07-03 (×7): qty 1

## 2021-07-03 MED ORDER — HYDROCORTISONE SOD SUC (PF) 100 MG IJ SOLR
100.0000 mg | Freq: Once | INTRAMUSCULAR | Status: AC
  Administered 2021-07-04: 100 mg via INTRAVENOUS
  Filled 2021-07-03: qty 2

## 2021-07-03 MED ORDER — ARTIFICIAL TEARS OPHTHALMIC OINT
1.0000 "application " | TOPICAL_OINTMENT | Freq: Three times a day (TID) | OPHTHALMIC | Status: DC
  Administered 2021-07-03 – 2021-07-05 (×6): 1 via OPHTHALMIC

## 2021-07-03 MED ORDER — MIDAZOLAM HCL (PF) 5 MG/ML IJ SOLN
INTRAMUSCULAR | Status: AC
  Filled 2021-07-03: qty 2

## 2021-07-03 MED ORDER — SODIUM CHLORIDE 0.9 % IV SOLN
0.0000 ug/kg/min | INTRAVENOUS | Status: DC
  Administered 2021-07-03: 3 ug/kg/min via INTRAVENOUS
  Administered 2021-07-04: 6 ug/kg/min via INTRAVENOUS
  Administered 2021-07-04: 4 ug/kg/min via INTRAVENOUS
  Administered 2021-07-04: 6 ug/kg/min via INTRAVENOUS
  Administered 2021-07-04: 5 ug/kg/min via INTRAVENOUS
  Administered 2021-07-04: 8 ug/kg/min via INTRAVENOUS
  Administered 2021-07-05: 5 ug/kg/min via INTRAVENOUS
  Filled 2021-07-03 (×9): qty 20

## 2021-07-03 MED ORDER — SODIUM CHLORIDE 0.9 % IV SOLN
2.0000 g | Freq: Three times a day (TID) | INTRAVENOUS | Status: DC
  Administered 2021-07-03 – 2021-07-04 (×2): 2 g via INTRAVENOUS
  Filled 2021-07-03 (×4): qty 2

## 2021-07-03 MED ORDER — CISATRACURIUM BOLUS VIA INFUSION
5.0000 mg | Freq: Once | INTRAVENOUS | Status: AC
  Administered 2021-07-03: 5 mg via INTRAVENOUS
  Filled 2021-07-03: qty 5

## 2021-07-03 MED ORDER — MIDODRINE HCL 5 MG PO TABS
10.0000 mg | ORAL_TABLET | Freq: Three times a day (TID) | ORAL | Status: DC
  Administered 2021-07-03 – 2021-07-05 (×6): 10 mg
  Filled 2021-07-03 (×6): qty 2

## 2021-07-03 MED ORDER — VANCOMYCIN HCL 1000 MG/200ML IV SOLN: 1000.0000 mg | INTRAVENOUS | Status: DC

## 2021-07-03 MED ORDER — HYDROMORPHONE HCL 2 MG PO TABS
2.0000 mg | ORAL_TABLET | Freq: Four times a day (QID) | ORAL | Status: DC
  Administered 2021-07-03 – 2021-07-05 (×3): 2 mg
  Filled 2021-07-03 (×3): qty 1

## 2021-07-03 MED ORDER — PHENYLEPHRINE HCL-NACL 20-0.9 MG/250ML-% IV SOLN
0.0000 ug/min | INTRAVENOUS | Status: DC
  Administered 2021-07-03: 20 ug/min via INTRAVENOUS
  Administered 2021-07-04: 110 ug/min via INTRAVENOUS
  Administered 2021-07-04 (×4): 83 ug/min via INTRAVENOUS
  Administered 2021-07-04: 140 ug/min via INTRAVENOUS
  Administered 2021-07-05: 50 ug/min via INTRAVENOUS
  Administered 2021-07-05: 70 ug/min via INTRAVENOUS
  Administered 2021-07-05: 80 ug/min via INTRAVENOUS
  Filled 2021-07-03 (×3): qty 250
  Filled 2021-07-03: qty 500
  Filled 2021-07-03: qty 250
  Filled 2021-07-03 (×2): qty 500
  Filled 2021-07-03: qty 250

## 2021-07-03 MED ORDER — MIDAZOLAM-SODIUM CHLORIDE 100-0.9 MG/100ML-% IV SOLN
2.0000 mg/h | INTRAVENOUS | Status: DC
  Administered 2021-07-03: 2 mg/h via INTRAVENOUS
  Administered 2021-07-04: 7.5 mg/h via INTRAVENOUS
  Administered 2021-07-04 – 2021-07-05 (×3): 10 mg/h via INTRAVENOUS
  Filled 2021-07-03 (×5): qty 100

## 2021-07-03 MED ORDER — SODIUM CHLORIDE 0.9% IV SOLUTION: Freq: Once | INTRAVENOUS | Status: AC

## 2021-07-03 MED ORDER — CLONAZEPAM 0.5 MG PO TBDP: 0.5000 mg | ORAL_TABLET | Freq: Two times a day (BID) | ORAL | Status: DC

## 2021-07-03 MED ORDER — ALBUMIN HUMAN 25 % IV SOLN
12.5000 g | Freq: Once | INTRAVENOUS | Status: AC
Start: 2021-07-04 — End: 2021-07-04
  Administered 2021-07-03: 12.5 g via INTRAVENOUS
  Filled 2021-07-03: qty 50

## 2021-07-03 MED ORDER — DIGOXIN 0.25 MG/ML IJ SOLN
0.5000 mg | Freq: Once | INTRAMUSCULAR | Status: AC
  Administered 2021-07-03: 0.5 mg via INTRAVENOUS
  Filled 2021-07-03: qty 2

## 2021-07-03 MED ORDER — MIDAZOLAM BOLUS VIA INFUSION
1.0000 mg | INTRAVENOUS | Status: DC | PRN
  Administered 2021-07-03 – 2021-07-05 (×3): 2 mg via INTRAVENOUS
  Filled 2021-07-03: qty 2

## 2021-07-03 NOTE — Progress Notes (Signed)
eLink Physician-Brief Progress Note Patient Name: Chris [name]. DOB: [birth year] MRN: [id number]   Date of Service  06/26/2021  HPI/Events of Note  SpO2 a bit low  eICU Interventions  Changed to PC ventilation; seems to get better o2 exchange while on it     Intervention Category Intermediate Interventions: Respiratory distress - evaluation and management  Jacinta Shoe 06/22/2021, 12:46 AM

## 2021-07-03 NOTE — Progress Notes (Signed)
Palliative:  HPI: 55 y.o. male  with past medical history of atrial fibrillation, CHF, HTN, severe COPD, h/o ARDS requiring trach but has since been decannulated, diabetes, HIV admitted on 06/19/2021 with fever, shortness of breath, cough worsening over past 3 days. +Influenza A. Required intubation 07/04/2021 and cannulation for initiation of VV ECMO 06/12/2021. Noted past palliative care involvement with previous hospital stay in 2019 when he required trach and CRRT. Trach placed 06/20/21. Decannulated from ECMO 11/21. Recurrent bleeding with severe drops in hemoglobin. Acute worsening of status overnight 11/23-11/24 and now with multiorgan failure at end of life.   I came to Chris [name] bedside and he has worsened overnight. Discussed with Dr. Valeta Harms and we both agree that Chris [name] unfortunately at end of life and there is nothing else we can offer to improve his condition. I called and spoke with Chris [name]. I explained to her that Chris [name] worse and I am very concerned. I explained that Chris [name] now requiring paralytic to tolerate ventilator support and requiring FiO2 100%, requiring 3 vasopressors to support blood pressure, worsening liver and renal failure, ongoing severe drops in hemoglobin. I explained that Chris [name] time is limited as we are doing everything we can and he continues to decline.   Chris [name] understandably tearful. I offered to call support for her. She plans to call her family and come to the hospital. I discussed with her at this time that we strongly recommend not performing DNR or resuscitation measures as this will not do anything to improve Chris [name] condition and will only cause him pain and suffering at the end of his life. She verbalizes understanding and agrees. DNR placed.   I met with Chris [name] and another family member at bedside. Chris [name] very attentive to her husband in washing his face, shaving, and applying lotions and creams. After Chris [name] was done she spent some time crying. I gave her  some privacy to grieve. Chris [name] and I spoke further about where to go from here. I encouraged Chris [name] that we need to consider if the interventions that we are providing Chris [name] are truly benefiting him or if they are prolonging his suffering at end of life. I recommended that we consider transition to focus on comfort and allowing God to take control. Chris [name] shares that her daughter is on the way back from out of town but will not be back until tomorrow morning. She wishes to continue support until her daughter returns. I reassured Chris [name] that we will continue for now. I also explained that if Chris [name] continues to decline that we do not have anything else to add to stop this process and there is a chance he could pass away even continuing support. Chris [name] understands.   All questions/concerns addressed. Emotional support provided. Updated Chris [name] and Dr. Valeta Harms.   Exam: Sedated and paralyzed trach to vent FiO2 100%. HR tachy 120s. No vent dyssynchrony. Abd full but soft.   Plan: - DNR per conversation with wife.  - Family coming in the morning and consideration of transition to full comfort care.   80 min  Vinie Sill, NP Palliative Medicine Team Pager [phone] (Please see [email or web address] for schedule) Team Phone [phone]    Greater than 50%  of this time was spent counseling and coordinating care related to the above assessment and plan

## 2021-07-03 NOTE — Progress Notes (Signed)
Givens capsule deployed at 1003 via endoscopy by Dr Orvan Falconer. Bedside RN given instructions.

## 2021-07-03 NOTE — Progress Notes (Addendum)
NAME:  Chris Metoyer., MRN:  [id number], DOB:  11-11-1965, LOS: 20 ADMISSION DATE:  July 05, 2021, CONSULTATION DATE:  06/15/2019 REFERRING MD:  Gaynell Face, CHIEF COMPLAINT:  hypercapnic respiratory failure   History of Present Illness:   55 year old man with O2 dependent COPD presenting with acute on chronic respiratory failure now on VV ECMO due to refractory hypoxia and hypercapnia.  Pertinent  Medical History   Past Medical History:  Diagnosis Date   Anxiety    Atrial fibrillation (HCC)    CHF (congestive heart failure) (HCC)    COPD (chronic obstructive pulmonary disease) (HCC)    Emphysema [[name]]   Depression    Diabetes mellitus without complication (HCC)    type 2   Emphysema of lung (HCC)    GERD (gastroesophageal reflux disease)    HIV disease (HCC)    Hypertension    Oxygen deficiency     Significant Hospital Events: Including procedures, antibiotic start and stop dates in addition to other pertinent events   11/7- VV ECMO started 11/9 - weaning sedation. Transfused 2 units PRBC to keep O2 carrying capacity.  11/9 - remains agitated with wake up  11/10 - tracheostomy tube placed 11/14 - trach exchanged to distal XLT due to cuff leak 11/17  - weaned sweep down to 1.5lpm prior to developing melena 11/17  - EGD bleeding duodenal bulb AVM injected and cauterized. Duodenal bulb ischemia. 11/19 stool became maroon.  No improvement in hemoglobin despite 4 units of blood.  Increasing vasopressor requirements.  Despite this, tolerated sweep trial. 11/20 off bivalirudin, maroon stools have resolved.  Repeat endoscopy shows no active signs of bleeding.   11/21 Decannulated from ECMO 11/22 Continues to have recurrent bleeding.   Interim History / Subjective:   Continues on vasopressors. Capsule endoscopy to determine source of bleeding.    Objective   Blood pressure (!) 119/53, pulse (!) 117, temperature 99.7 F (37.6 C), temperature source Oral, resp. rate (!) 30, height  5\' 9"  (1.753 m), weight 118.3 kg, SpO2 96 %. CVP:  [4 mmHg-38 mmHg] 15 mmHg  Vent Mode: PSV;CPAP FiO2 (%):  [70 %-100 %] 70 % Set Rate:  [15 bmp-18 bmp] 18 bmp PEEP:  [12 cmH20] 12 cmH20 Pressure Support:  [18 cmH20] 18 cmH20 Plateau Pressure:  [28 cmH20] 28 cmH20   Intake/Output Summary (Last 24 hours) at 07/09/2021 1050 Last data filed at 07/01/2021 0900 Gross per 24 hour  Intake 4027.83 ml  Output 8375 ml  Net -4347.17 ml    Filed Weights   07/02/21 0530 06/23/2021 0500 06/15/2021 1012  Weight: 117.8 kg 118.3 kg 118.3 kg    Examination: General: critically ill appearing man lying in bed trached, sedated, HEENT: [place]/AT, eyes anicteric Neck: no bleeding or drainage around trach, Shiley cuffed distal XLT #8 Resp:  large breaths on PSV. Rhonchi at both bases Cardio: S1S2, irreg rhythm, reg rate GI: abdomen soft with dark brown.  effluent from flexiseal  Neuro: RASS -4, intact cough reflex, PERRL Extremities: moderate dependent edema, no cyanosis GU: yellow urine, foley Derm: warm, dry, no rashes or ecchymoses  Ancillary tests:    CXR reviewed and shows both lung fields clear with cannula in good position.  Bibasilar atelectasis.  ABG: mild compensated respiratory acidosis.  Leukocytosis resolved.  Sodium 152 and creatinine is improved.  HB 7.4 Plt 98  Assessment & Plan:   Critically ill  due to acute on chronic hypoxic and hypercapnic respiratory failure from influenza pneumonia on background of  severe COPD, prior tracheostomy. On HOT 10 years. Acute COPD exacerbation; severe underlying COPD- Pseudomonas and MSSA HAP Now trach dependent Concern for undiagnosed OSA Acute metabolic encephalopathy with severe agitation HIV, undetectable VL Distributive and hemorrhagic shock due to sedation Acute blood loss anemia due to acute bleeding duodenal AVM Chronic Afib w/RVR H/o HFpEF GERD Type 2 diabetes with hyperglycemia Hypernatremia from high insensitive  losses Deconditioning, acute on chronic  Plan:   -Doing well off ECMO - wean FiO2 as tolerated. Slow PSV wean. Periodic ABG to check PO2 as previously big discrepancy between ABG and SpO2. -Capsule endoscopy - GI bleed likely driving vasopressor requirements and weaning sedation and FiO2 difficult.  Titrate to keep SBP>100 - Continue to wean sedation slowly - see if can come off dexmedetomidine first as likely most affecting pressor requirements.  - Continue free water - Transfuse as necessary to keep HB> 9.0 - Continue ART.  - Continue to diurese - still 9 L up   Best Practice (right click and "Reselect all SmartList Selections" daily)   Diet/type: tubefeeds  DVT prophylaxis: bivalirudin GI prophylaxis: PPI Lines: Central line and Arterial Line Foley:  external catheter Code Status:  full code Last date of multidisciplinary goals of care discussion: continue for family updates   CRITICAL CARE Performed by: Kipp Brood  Total critical care time: 40 minutes  Critical care time was exclusive of separately billable procedures and treating other patients.  Critical care was necessary to treat or prevent imminent or life-threatening deterioration.  Critical care was time spent personally by me on the following activities: development of treatment plan with patient and/or surrogate as well as nursing, discussions with consultants, evaluation of patient's response to treatment, examination of patient, obtaining history from patient or surrogate, ordering and performing treatments and interventions, ordering and review of laboratory studies, ordering and review of radiographic studies, pulse oximetry, re-evaluation of patient's condition and participation in multidisciplinary rounds.  Kipp Brood, MD [facility] ICU Physician [place]  Pager: [phone] Mobile: [phone] After hours: [phone].

## 2021-07-03 NOTE — Progress Notes (Addendum)
eLink Physician-Brief Progress Note Patient Name: Chris [name]. DOB: Apr 14, 1966 MRN: [id number]   Date of Service  07/06/2021  HPI/Events of Note  Patient was on PRVC VT 560, RR 18, peep 12, Fio2 100 and an 8 pm ABG was done . I was asked to comment.    eICU Interventions  On camera, patient Is sedated and paramyzed - diluadid, versed and nimbex drips. Also has levophed and vasopressin infusing and amiodarone, making good urine  I am told he was paralyzed only this evening around 530 pm. This would explain the hypercapnia. RT had increased RR to 25. Current peak pressure is 32, plateau is 25 and we are at 8 cc/kg IBW already.   Repeat ABG at 10 pm. Overall poor prognosis. I am told day team discussed with family as well.      Intervention Category Major Interventions: Respiratory failure - evaluation and management  Oretha Milch 06/30/2021, 8:51 PM  Addendum at 10:45 pm Slight improvement in ABG noted However RN also notes patient has had increase in his pressor needs all day Last hemoglobin was 6.7 No bleeding from anywhere  Has also had fevers Is on vanc and meropenem  Will give 1 unit RBC  Increase RR to 32 Repeat ABG and check lactate at 1 am   Addendum at 11:35 pm  Max on levophed and vasopressin, HR in 130s, hypotensive and febrile Adding phenylephrine, stress steroids, check CBC BMP lactate as well, also try albumin x 1 Will also notify ground team   Addendum 6:20 am Noted labs Patient is already on an insulin infusion, has been getting lasix and has started to make more urine in the last couple hours Will give calcium gluconate x 1 Continues on 3 pressors, Hb improved post RBC transfusion Repeat labs ordered for 10 am including an ABG  D/w RN RN had updated his wife on his condition

## 2021-07-03 NOTE — Op Note (Signed)
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 The patient is already sedated for mechanical                            ventilation Complications:            No immediate complications. Estimated Blood Loss:     Estimated blood loss: none. Procedure:                Pre-Anesthesia Assessment:                           - Prior to the procedure, a History and Physical  was performed, and patient medications and                            allergies were reviewed. The patient's tolerance of                            previous anesthesia was also reviewed. The risks                            and benefits of the procedure and the sedation                            options and risks were discussed with the patient.                            All questions were answered, and informed consent                             was obtained. Prior Anticoagulants: The patient has                            taken no previous anticoagulant or antiplatelet                            agents. ASA Grade Assessment: III - A patient with                            severe systemic disease. After reviewing the risks                            and benefits, the patient was deemed in                            satisfactory condition to undergo the procedure.                           After obtaining informed consent from the patient's                            wife, the endoscope was passed under direct vision.                            Throughout the procedure, the patient's blood                            pressure, pulse, and oxygen saturations were                            monitored continuously. The [id number]-[id number]                            Olympus endoscope was introduced through the mouth,  and advanced to the second part of duodenum. The                            gastroscope was then withdrawn. A capsule endoscopy                            deployer was advanced through the gastroscope and                            the capsule attached. The gastroscope was then                            reintroduced through the mouth and advanced to the                            second part of the duodenum where the capsule was                            successfully deployed. The upper GI endoscopy was                            accomplished without difficulty. The patient                            tolerated the procedure well. Scope In: Scope Out: Findings:      The esophagus was normal. No blood present.      There is a brown, gritty residue throughout the stomach and duodenum of       unclear etiology - ? retained enteral feeds oxidized in the setting of       bile      The residual limits any meaningful evaluation of parts of the stomach,       particularl the duodenal bulb,  and the second portion of the duodenum No       gastric mucosal abnormalities. No blood present.      There is mild, patchy erythema in the second portion of the duodenum. No       blood present. The site of the prior AVM was not visualized - likely due       to the retained contents noted above.      Capsule endoscopy successfully deployed in the 2nd portion of the       duodenum. Impression:               - Normal esophagus.                           - Unusual brown grit throughout the stomach and                            duodenum limiting evaluation of the mucosa. This is                            not thought to be related to his bleeding. [name]                           -  No evidence for recent or active bleeding. No                            blood present.                           - Successful deployment of the capsule in the                            second portion of the duodenum. Moderate Sedation:      Note used today. Recommendation:           - Patient has a contact number available for                            emergencies. The signs and symptoms of potential                            delayed complications were discussed with the                            patient. Return to normal activities tomorrow.                            Written discharge instructions were provided to the                            patient.                           - Resume previous diet per capsule endoscopy                            protocol.                           - Continue present medications including PPI                            treatment.                           - Continue serial hgb/hct with transfusion as                            indicated. Procedure Code(s):        --- Professional ---                           [phone], Esophagogastroduodenoscopy, flexible,                            transoral; diagnostic, including collection of                            specimen(s) by  brushing or washing, when performed                            (  separate procedure) Diagnosis Code(s):        --- Professional ---                           K92.2, Gastrointestinal hemorrhage, unspecified CPT copyright 2019 American Medical Association. All rights reserved. The codes documented in this report are preliminary and upon coder review may  be revised to meet current compliance requirements. Thornton Park MD, MD 07/09/2021 11:33:24 AM This report has been signed electronically. Number of Addenda: 0

## 2021-07-03 NOTE — Progress Notes (Signed)
MD at bedside and weaned PSV from 18 to 16, fio2 weaned from 70% to 60%.  Within a couple of minutes, MD and myself noted HR slowly increasing to 130's.  Per MD, continue to try to wean for a few more minutes to assess if HR will return to baseline, if not= per MD okay for RT to increase PSV to 18.  After several minutes, noted HR now 140's-150's.  RN came to bedside to assess.  PSV was changed back to 18.  HR now 130.

## 2021-07-03 NOTE — Anesthesia Preprocedure Evaluation (Deleted)
Anesthesia Evaluation    Reviewed: Allergy & Precautions, Patient's Chart, lab work & pertinent test resultsPreop documentation limited or incomplete due to emergent nature of procedure.  History of Anesthesia Complications Negative for: history of anesthetic complications  Airway Mallampati: Intubated       Dental   Pulmonary sleep apnea , pneumonia, COPD, Patient abstained from smoking., former smoker,  Refractory hypoxia 2/2 influenza requiring vent support, ECMO consult.   Covid-19 Nucleic Acid Test Results Lab Results      Component                Value               Date                      SARSCOV2NAA              NEGATIVE            06/25/2021                SARSCOV2NAA              NEGATIVE            10/29/2020                SARSCOV2NAA              Not Detected        09/24/2020                SARSCOV2NAA              NEGATIVE            04/30/2020                SARSCOV2NAA              NEGATIVE            10/06/2019               + decreased breath sounds      Cardiovascular hypertension, +CHF and + DOE   Rhythm:Regular     Neuro/Psych PSYCHIATRIC DISORDERS Anxiety Depression    GI/Hepatic Neg liver ROS, GERD  Medicated and Controlled,  Endo/Other  diabetes  Renal/GU Renal diseaseLab Results      Component                Value               Date                      CREATININE               1.64 (H)            07/04/2021           Lab Results      Component                Value               Date                      K                        4.7                 06/12/2021  Musculoskeletal  (+) Arthritis ,   Abdominal   Peds  Hematology  (+) Blood dyscrasia, anemia , HIV, Lab Results      Component                Value               Date                      WBC                      7.9                 06/21/2021                HGB                      13.6                06/20/2021                 HCT                      40.0                06/22/2021                MCV                      100.5 (H)           07/06/2021                PLT                      157                 07/10/2021              Anesthesia Other Findings   Reproductive/Obstetrics                             Anesthesia Physical  Anesthesia Plan  ASA: 5  Anesthesia Plan: General   Post-op Pain Management:    Induction:   PONV Risk Score and Plan: 2 and Treatment may vary due to age or medical condition  Airway Management Planned: Oral ETT  Additional Equipment:   Intra-op Plan:   Post-operative Plan: Post-operative intubation/ventilation  Informed Consent:   Plan Discussed with: Anesthesiologist  Anesthesia Plan Comments:         Anesthesia Quick Evaluation

## 2021-07-03 NOTE — Procedures (Signed)
Cortrak  Person Inserting Tube:  Osa Craver, RD Tube Type:  Cortrak - 43 inches Tube Size:  10 Tube Location:  Left nare Initial Placement:  Stomach Secured by: Bridle Technique Used to Measure Tube Placement:  Marking at nare/corner of mouth Cortrak Secured At:  80 cm  Cortrak Tube Team Note:  Consult received to place a Cortrak feeding tube.   X-ray is required, abdominal x-ray has been ordered by the Cortrak team. Please confirm tube placement before using the Cortrak tube.   If the tube becomes dislodged please keep the tube and contact the Cortrak team at [email or web address] ([email or web address]) for replacement.  If after hours and replacement cannot be delayed, place a NG tube and confirm placement with an abdominal x-ray.   Romelle Starcher MS, RDN, LDN, CNSC Registered Dietitian III Clinical Nutrition RD Pager and On-Call Pager Number Located in [place]

## 2021-07-03 NOTE — Progress Notes (Signed)
Pharmacy Antibiotic Note  Chris [name]. is a 55 y.o. male admitted on 06/16/2021 now s/p ECMO. Pharmacy has been consulted for vancomycin and meropenem dosing. Tmax 105. WBC wnl.   Plan: Start meropenem 2g q8h  Vancomycin 2500mg  x1 loading dose followed by vancomycin 1000mg  q24h (eAUC 441 using Scr 2.04 and Vd 0.5) Monitor renal function, cultures, and clinical progression  Vancomycin level as indicated   Height: 5\' 9"  (175.3 cm) Weight: 118.3 kg (260 lb 12.9 oz) IBW/kg (Calculated) : 70.7  Temp (24hrs), Avg:100.8 F (38.2 C), Min:99 F (37.2 C), Max:105 F (40.6 C)  Recent Labs  Lab Jul 15, 2021 0348 07/15/21 1603 06/28/21 0350 06/28/21 1605 06/29/21 0337 06/29/21 1411 07/01/2021 0351 06/24/2021 1553 07/01/21 1541 07/01/21 2133 07/02/21 0312 07/02/21 0512 07/02/21 1200 07/02/21 1659 07/06/2021 0248  WBC  --    < > 23.0*   < > 15.1*   < > 15.9*   < > 9.8 10.8* 11.0* 10.8* 8.9 8.9 7.6  CREATININE  --    < > 1.75*   < > 1.64*   < > 1.73*   < > 2.01* 2.02* 2.16*  --   --  2.00* 2.04*  LATICACIDVEN 0.6  --  0.8  --  0.8  --  0.9  --   --   --   --   --   --   --   --    < > = values in this interval not displayed.    Estimated Creatinine Clearance: 51.9 mL/min (A) (by C-G formula based on SCr of 2.04 mg/dL (H)).    Allergies  Allergen Reactions   Bactrim [Sulfamethoxazole-Trimethoprim] Hives    Antimicrobials this admission: Unasyn 11/6 >> 11/7 Cefepime 11/8 >> 11/21 Vancomycin 11/7 >> 11/9; x 11/17; 11/23 >>  Meropenem 11/23 >>   Dose adjustments this admission:  Microbiology results: 11/17 bcx: negative  11/17 TA: pseudomonas (pan sensitive) 11/13 Sputum: few PsA - pan sensitive 11/7 Sputum: Staph aureus, Pseudomonas 11/3, 11/7, 11/14 MRSA PCR: negative 11/3 Resp panel: Influenza A positive  Thank you for allowing pharmacy to be a part of this patient's care.  13/7, PharmD, BCPS Clinical Pharmacist 06/23/2021 5:09 PM

## 2021-07-03 NOTE — Progress Notes (Signed)
PT Cancellation/Discharge Note  Patient Details Name: Chris [name]. MRN: [id number] DOB: Mar 06, 1966   Cancelled Treatment:    Reason Eval/Treat Not Completed: Patient not medically ready. Have been following pt for >10 days and pt continues to have medical issues which have limited PT intervention. Will sign off at this time and wait for reorder of PT when able to participate in PT.    Angelina Ok [facility] 06/29/2021, 8:56 AM Skip Mayer PT Acute Rehabilitation Services Pager [phone] Office [phone]

## 2021-07-04 DIAGNOSIS — J101 Influenza due to other identified influenza virus with other respiratory manifestations: Secondary | ICD-10-CM | POA: Diagnosis not present

## 2021-07-04 LAB — CBC WITH DIFFERENTIAL/PLATELET
Abs Immature Granulocytes: 0.07 10*3/uL (ref 0.00–0.07)
Abs Immature Granulocytes: 0.06 10*3/uL (ref 0.00–0.07)
Basophils Absolute: 0 10*3/uL (ref 0.0–0.1)
Basophils Absolute: 0 10*3/uL (ref 0.0–0.1)
Basophils Relative: 0 %
Basophils Relative: 0 %
Eosinophils Absolute: 0 10*3/uL (ref 0.0–0.5)
Eosinophils Absolute: 0 10*3/uL (ref 0.0–0.5)
Eosinophils Relative: 0 %
Eosinophils Relative: 0 %
HCT: 22.7 % — ABNORMAL LOW (ref 39.0–52.0)
HCT: 25.7 % — ABNORMAL LOW (ref 39.0–52.0)
Hemoglobin: 6.2 g/dL — CL (ref 13.0–17.0)
Hemoglobin: 7.5 g/dL — ABNORMAL LOW (ref 13.0–17.0)
Immature Granulocytes: 1 %
Immature Granulocytes: 1 %
Lymphocytes Relative: 8 %
Lymphocytes Relative: 5 %
Lymphs Abs: 0.7 10*3/uL (ref 0.7–4.0)
Lymphs Abs: 0.5 10*3/uL — ABNORMAL LOW (ref 0.7–4.0)
MCH: 28.8 pg (ref 26.0–34.0)
MCH: 29.4 pg (ref 26.0–34.0)
MCHC: 27.3 g/dL — ABNORMAL LOW (ref 30.0–36.0)
MCHC: 29.2 g/dL — ABNORMAL LOW (ref 30.0–36.0)
MCV: 105.6 fL — ABNORMAL HIGH (ref 80.0–100.0)
MCV: 100.8 fL — ABNORMAL HIGH (ref 80.0–100.0)
Monocytes Absolute: 0.7 10*3/uL (ref 0.1–1.0)
Monocytes Absolute: 0.2 10*3/uL (ref 0.1–1.0)
Monocytes Relative: 8 %
Monocytes Relative: 2 %
Neutro Abs: 7.5 10*3/uL (ref 1.7–7.7)
Neutro Abs: 9.6 10*3/uL — ABNORMAL HIGH (ref 1.7–7.7)
Neutrophils Relative %: 83 %
Neutrophils Relative %: 92 %
Platelets: 105 10*3/uL — ABNORMAL LOW (ref 150–400)
Platelets: 119 10*3/uL — ABNORMAL LOW (ref 150–400)
RBC: 2.15 MIL/uL — ABNORMAL LOW (ref 4.22–5.81)
RBC: 2.55 MIL/uL — ABNORMAL LOW (ref 4.22–5.81)
RDW: 18 % — ABNORMAL HIGH (ref 11.5–15.5)
RDW: 18 % — ABNORMAL HIGH (ref 11.5–15.5)
WBC: 9 10*3/uL (ref 4.0–10.5)
WBC: 10.4 10*3/uL (ref 4.0–10.5)
nRBC: 1.2 % — ABNORMAL HIGH (ref 0.0–0.2)
nRBC: 0.8 % — ABNORMAL HIGH (ref 0.0–0.2)

## 2021-07-04 LAB — BASIC METABOLIC PANEL
Anion gap: 5 (ref 5–15)
Anion gap: 8 (ref 5–15)
Anion gap: 8 (ref 5–15)
Anion gap: 8 (ref 5–15)
BUN: 139 mg/dL — ABNORMAL HIGH (ref 6–20)
BUN: 147 mg/dL — ABNORMAL HIGH (ref 6–20)
BUN: 151 mg/dL — ABNORMAL HIGH (ref 6–20)
BUN: 163 mg/dL — ABNORMAL HIGH (ref 6–20)
CO2: 34 mmol/L — ABNORMAL HIGH (ref 22–32)
CO2: 31 mmol/L (ref 22–32)
CO2: 29 mmol/L (ref 22–32)
CO2: 29 mmol/L (ref 22–32)
Calcium: 7.8 mg/dL — ABNORMAL LOW (ref 8.9–10.3)
Calcium: 7.7 mg/dL — ABNORMAL LOW (ref 8.9–10.3)
Calcium: 7.9 mg/dL — ABNORMAL LOW (ref 8.9–10.3)
Calcium: 7.7 mg/dL — ABNORMAL LOW (ref 8.9–10.3)
Chloride: 112 mmol/L — ABNORMAL HIGH (ref 98–111)
Chloride: 109 mmol/L (ref 98–111)
Chloride: 111 mmol/L (ref 98–111)
Chloride: 109 mmol/L (ref 98–111)
Creatinine, Ser: 3.3 mg/dL — ABNORMAL HIGH (ref 0.61–1.24)
Creatinine, Ser: 3.78 mg/dL — ABNORMAL HIGH (ref 0.61–1.24)
Creatinine, Ser: 4.02 mg/dL — ABNORMAL HIGH (ref 0.61–1.24)
Creatinine, Ser: 4.36 mg/dL — ABNORMAL HIGH (ref 0.61–1.24)
GFR, Estimated: 21 mL/min — ABNORMAL LOW (ref >60)
GFR, Estimated: 18 mL/min — ABNORMAL LOW (ref >60)
GFR, Estimated: 17 mL/min — ABNORMAL LOW (ref >60)
GFR, Estimated: 15 mL/min — ABNORMAL LOW (ref >60)
Glucose, Bld: 169 mg/dL — ABNORMAL HIGH (ref 70–99)
Glucose, Bld: 174 mg/dL — ABNORMAL HIGH (ref 70–99)
Glucose, Bld: 223 mg/dL — ABNORMAL HIGH (ref 70–99)
Glucose, Bld: 184 mg/dL — ABNORMAL HIGH (ref 70–99)
Potassium: 6 mmol/L — ABNORMAL HIGH (ref 3.5–5.1)
Potassium: 5.9 mmol/L — ABNORMAL HIGH (ref 3.5–5.1)
Potassium: 5.7 mmol/L — ABNORMAL HIGH (ref 3.5–5.1)
Potassium: 5.6 mmol/L — ABNORMAL HIGH (ref 3.5–5.1)
Sodium: 151 mmol/L — ABNORMAL HIGH (ref 135–145)
Sodium: 148 mmol/L — ABNORMAL HIGH (ref 135–145)
Sodium: 148 mmol/L — ABNORMAL HIGH (ref 135–145)
Sodium: 146 mmol/L — ABNORMAL HIGH (ref 135–145)

## 2021-07-04 LAB — GLUCOSE, CAPILLARY
Glucose-Capillary: 139 mg/dL — ABNORMAL HIGH (ref 70–99)
Glucose-Capillary: 149 mg/dL — ABNORMAL HIGH (ref 70–99)
Glucose-Capillary: 150 mg/dL — ABNORMAL HIGH (ref 70–99)
Glucose-Capillary: 154 mg/dL — ABNORMAL HIGH (ref 70–99)
Glucose-Capillary: 158 mg/dL — ABNORMAL HIGH (ref 70–99)
Glucose-Capillary: 158 mg/dL — ABNORMAL HIGH (ref 70–99)
Glucose-Capillary: 175 mg/dL — ABNORMAL HIGH (ref 70–99)
Glucose-Capillary: 177 mg/dL — ABNORMAL HIGH (ref 70–99)
Glucose-Capillary: 183 mg/dL — ABNORMAL HIGH (ref 70–99)
Glucose-Capillary: 186 mg/dL — ABNORMAL HIGH (ref 70–99)
Glucose-Capillary: 193 mg/dL — ABNORMAL HIGH (ref 70–99)
Glucose-Capillary: 193 mg/dL — ABNORMAL HIGH (ref 70–99)
Glucose-Capillary: 194 mg/dL — ABNORMAL HIGH (ref 70–99)
Glucose-Capillary: 195 mg/dL — ABNORMAL HIGH (ref 70–99)
Glucose-Capillary: 201 mg/dL — ABNORMAL HIGH (ref 70–99)
Glucose-Capillary: 211 mg/dL — ABNORMAL HIGH (ref 70–99)

## 2021-07-04 LAB — CBC
HCT: 25.1 % — ABNORMAL LOW (ref 39.0–52.0)
HCT: 24.2 % — ABNORMAL LOW (ref 39.0–52.0)
Hemoglobin: 7.2 g/dL — ABNORMAL LOW (ref 13.0–17.0)
Hemoglobin: 7 g/dL — ABNORMAL LOW (ref 13.0–17.0)
MCH: 29.4 pg (ref 26.0–34.0)
MCH: 28.9 pg (ref 26.0–34.0)
MCHC: 28.7 g/dL — ABNORMAL LOW (ref 30.0–36.0)
MCHC: 28.9 g/dL — ABNORMAL LOW (ref 30.0–36.0)
MCV: 102.4 fL — ABNORMAL HIGH (ref 80.0–100.0)
MCV: 100 fL (ref 80.0–100.0)
Platelets: 103 10*3/uL — ABNORMAL LOW (ref 150–400)
Platelets: 115 10*3/uL — ABNORMAL LOW (ref 150–400)
RBC: 2.45 MIL/uL — ABNORMAL LOW (ref 4.22–5.81)
RBC: 2.42 MIL/uL — ABNORMAL LOW (ref 4.22–5.81)
RDW: 18.3 % — ABNORMAL HIGH (ref 11.5–15.5)
RDW: 17.2 % — ABNORMAL HIGH (ref 11.5–15.5)
WBC: 10.2 10*3/uL (ref 4.0–10.5)
WBC: 10.4 10*3/uL (ref 4.0–10.5)
nRBC: 1.1 % — ABNORMAL HIGH (ref 0.0–0.2)
nRBC: 0.9 % — ABNORMAL HIGH (ref 0.0–0.2)

## 2021-07-04 LAB — POCT I-STAT 7, (LYTES, BLD GAS, ICA,H+H)
Acid-Base Excess: 5 mmol/L — ABNORMAL HIGH (ref 0.0–2.0)
Acid-Base Excess: 1 mmol/L (ref 0.0–2.0)
Acid-Base Excess: 0 mmol/L (ref 0.0–2.0)
Bicarbonate: 34.4 mmol/L — ABNORMAL HIGH (ref 20.0–28.0)
Bicarbonate: 29.4 mmol/L — ABNORMAL HIGH (ref 20.0–28.0)
Bicarbonate: 28.9 mmol/L — ABNORMAL HIGH (ref 20.0–28.0)
Calcium, Ion: 1.16 mmol/L (ref 1.15–1.40)
Calcium, Ion: 1.14 mmol/L — ABNORMAL LOW (ref 1.15–1.40)
Calcium, Ion: 1.16 mmol/L (ref 1.15–1.40)
HCT: 20 % — ABNORMAL LOW (ref 39.0–52.0)
HCT: 24 % — ABNORMAL LOW (ref 39.0–52.0)
HCT: 22 % — ABNORMAL LOW (ref 39.0–52.0)
Hemoglobin: 6.8 g/dL — CL (ref 13.0–17.0)
Hemoglobin: 8.2 g/dL — ABNORMAL LOW (ref 13.0–17.0)
Hemoglobin: 7.5 g/dL — ABNORMAL LOW (ref 13.0–17.0)
O2 Saturation: 93 %
O2 Saturation: 72 %
O2 Saturation: 92 %
Patient temperature: 100.8
Patient temperature: 99.2
Patient temperature: 100
Potassium: 5.9 mmol/L — ABNORMAL HIGH (ref 3.5–5.1)
Potassium: 5.6 mmol/L — ABNORMAL HIGH (ref 3.5–5.1)
Potassium: 5.8 mmol/L — ABNORMAL HIGH (ref 3.5–5.1)
Sodium: 150 mmol/L — ABNORMAL HIGH (ref 135–145)
Sodium: 149 mmol/L — ABNORMAL HIGH (ref 135–145)
Sodium: 147 mmol/L — ABNORMAL HIGH (ref 135–145)
TCO2: 37 mmol/L — ABNORMAL HIGH (ref 22–32)
TCO2: 32 mmol/L (ref 22–32)
TCO2: 31 mmol/L (ref 22–32)
pCO2 arterial: 96.9 mmHg (ref 32.0–48.0)
pCO2 arterial: 71.4 mmHg (ref 32.0–48.0)
pCO2 arterial: 76.7 mmHg (ref 32.0–48.0)
pH, Arterial: 7.165 — CL (ref 7.350–7.450)
pH, Arterial: 7.224 — ABNORMAL LOW (ref 7.350–7.450)
pH, Arterial: 7.188 — CL (ref 7.350–7.450)
pO2, Arterial: 94 mmHg (ref 83.0–108.0)
pO2, Arterial: 48 mmHg — ABNORMAL LOW (ref 83.0–108.0)
pO2, Arterial: 84 mmHg (ref 83.0–108.0)

## 2021-07-04 LAB — LACTIC ACID, PLASMA
Lactic Acid, Venous: 0.7 mmol/L (ref 0.5–1.9)
Lactic Acid, Venous: 0.6 mmol/L (ref 0.5–1.9)
Lactic Acid, Venous: 0.8 mmol/L (ref 0.5–1.9)

## 2021-07-04 LAB — HEPATIC FUNCTION PANEL
ALT: 88 U/L — ABNORMAL HIGH (ref 0–44)
AST: 127 U/L — ABNORMAL HIGH (ref 15–41)
Albumin: <1.5 g/dL — ABNORMAL LOW (ref 3.5–5.0)
Alkaline Phosphatase: 32 U/L — ABNORMAL LOW (ref 38–126)
Bilirubin, Direct: 0.1 mg/dL (ref 0.0–0.2)
Indirect Bilirubin: 0.7 mg/dL (ref 0.3–0.9)
Total Bilirubin: 0.8 mg/dL (ref 0.3–1.2)
Total Protein: 6.3 g/dL — ABNORMAL LOW (ref 6.5–8.1)

## 2021-07-04 LAB — PROTIME-INR
INR: 1.3 — ABNORMAL HIGH (ref 0.8–1.2)
Prothrombin Time: 16.6 seconds — ABNORMAL HIGH (ref 11.4–15.2)

## 2021-07-04 LAB — FIBRINOGEN: Fibrinogen: >800 mg/dL — ABNORMAL HIGH (ref 210–475)

## 2021-07-04 LAB — LACTATE DEHYDROGENASE: LDH: 295 U/L — ABNORMAL HIGH (ref 98–192)

## 2021-07-04 LAB — GLUCOSE, RANDOM: Glucose, Bld: 257 mg/dL — ABNORMAL HIGH (ref 70–99)

## 2021-07-04 MED ORDER — METHYLPREDNISOLONE SODIUM SUCC 125 MG IJ SOLR
INTRAMUSCULAR | Status: AC
  Filled 2021-07-04: qty 2

## 2021-07-04 MED ORDER — DEXTROSE 5 % IV SOLN
INTRAVENOUS | Status: DC
  Administered 2021-07-04: 250 mL via INTRAVENOUS

## 2021-07-04 MED ORDER — INSULIN ASPART 100 UNIT/ML IV SOLN
10.0000 [IU] | Freq: Once | INTRAVENOUS | Status: AC
  Administered 2021-07-04: 10 [IU] via INTRAVENOUS

## 2021-07-04 MED ORDER — SODIUM ZIRCONIUM CYCLOSILICATE 5 G PO PACK
5.0000 g | PACK | Freq: Once | ORAL | Status: AC
  Administered 2021-07-04: 5 g via ORAL
  Filled 2021-07-04 (×2): qty 1

## 2021-07-04 MED ORDER — METHYLPREDNISOLONE SODIUM SUCC 125 MG IJ SOLR
60.0000 mg | Freq: Four times a day (QID) | INTRAMUSCULAR | Status: DC
  Administered 2021-07-04: 60 mg via INTRAVENOUS

## 2021-07-04 MED ORDER — VANCOMYCIN VARIABLE DOSE PER UNSTABLE RENAL FUNCTION (PHARMACIST DOSING): Status: DC

## 2021-07-04 MED ORDER — SODIUM CHLORIDE 0.9 % IV SOLN
2.0000 g | Freq: Two times a day (BID) | INTRAVENOUS | Status: DC
  Filled 2021-07-04: qty 2

## 2021-07-04 MED ORDER — METHYLPREDNISOLONE SODIUM SUCC 125 MG IJ SOLR
120.0000 mg | Freq: Two times a day (BID) | INTRAMUSCULAR | Status: DC
Start: 2021-07-04 — End: 2021-07-05
  Administered 2021-07-04 – 2021-07-05 (×3): 120 mg via INTRAVENOUS
  Filled 2021-07-04 (×3): qty 2

## 2021-07-04 MED ORDER — DEXTROSE 10 % IV SOLN: Freq: Once | INTRAVENOUS | Status: DC

## 2021-07-04 MED ORDER — ALBUTEROL SULFATE (2.5 MG/3ML) 0.083% IN NEBU
2.5000 mg | INHALATION_SOLUTION | RESPIRATORY_TRACT | Status: DC | PRN
  Administered 2021-07-04: 2.5 mg via RESPIRATORY_TRACT
  Filled 2021-07-04: qty 3

## 2021-07-04 MED ORDER — DEXTROSE 50 % IV SOLN
1.0000 | Freq: Once | INTRAVENOUS | Status: AC
  Administered 2021-07-04: 50 mL via INTRAVENOUS
  Filled 2021-07-04: qty 50

## 2021-07-04 MED ORDER — SODIUM CHLORIDE 0.9 % IV SOLN
1.0000 g | Freq: Two times a day (BID) | INTRAVENOUS | Status: DC
  Administered 2021-07-04 – 2021-07-05 (×2): 1 g via INTRAVENOUS
  Filled 2021-07-04 (×3): qty 1

## 2021-07-04 MED ORDER — CALCIUM GLUCONATE-NACL 1-0.675 GM/50ML-% IV SOLN
1.0000 g | Freq: Once | INTRAVENOUS | Status: AC
  Administered 2021-07-04: 1000 mg via INTRAVENOUS
  Filled 2021-07-04: qty 50

## 2021-07-04 MED ORDER — SODIUM BICARBONATE 8.4 % IV SOLN
50.0000 meq | Freq: Once | INTRAVENOUS | Status: AC
  Administered 2021-07-04: 50 meq via INTRAVENOUS
  Filled 2021-07-04: qty 50

## 2021-07-04 MED ORDER — CALCIUM GLUCONATE 10 % IV SOLN
1.0000 g | Freq: Once | INTRAVENOUS | Status: AC
  Administered 2021-07-04: 1 g via INTRAVENOUS
  Filled 2021-07-04 (×2): qty 10

## 2021-07-04 NOTE — Significant Event (Signed)
OVERNIGHT COVERAGE CRITICAL CARE PROGRESS NOTE  CTSP re: worsening hypercapnea.  Patient remains intubated, sedated, and paralyzed on Versed/Dilaudid/Nimbex.  On norepinephrine, vasopressin and phenylephrine infusions for blood pressure support.  Currently getting 1 U PRBC transfusion for Hg 6.7.  U/O reportedly has decreased precipitously throughout the night with increase in vasopressor requirement.  On exam, diminished breath sounds bilaterally with low pitched wheezing bilaterally.  ABG    Component Value Date/Time   PHART 7.111 (LL) 07/02/2021 2205   PCO2ART 109.6 (HH) 06/22/2021 2205   PO2ART 94 06/14/2021 2205   HCO3 33.8 (H) 06/16/2021 2205   TCO2 37 (H) 06/13/2021 2205   ACIDBASEDEF 1.0 06/24/2021 1606   O2SAT 90.0 07/04/2021 2205   Current vent settings: PRVC 32/560/100/12.  SpO2 100%.  This is causing Ppeak and Pplat to exceed 30 ccH2O.  Albuterol q 2 hr PRN. SoluMedrol 60 mg IV q 6 hr. Will try decreasing PEEP slowly to 8. Increase vasopressin to 0.04. Repeat ABG in 1 hour  Marcelle Smiling, MD Board Certified by the ABIM, Pulmonary Diseases & Critical Care Medicine

## 2021-07-04 NOTE — Progress Notes (Signed)
NAME:  Chris Prestage., MRN:  [id number], DOB:  Apr 10, 1966, LOS: 21 ADMISSION DATE:  07/01/2021, CONSULTATION DATE:  06/15/2019 REFERRING MD:  Ruthann Cancer, CHIEF COMPLAINT:  hypercapnic respiratory failure   History of Present Illness:   55 year old man with O2 dependent COPD presenting with acute on chronic respiratory failure now on VV ECMO due to refractory hypoxia and hypercapnia.  Pertinent  Medical History   Past Medical History:  Diagnosis Date   Anxiety    Atrial fibrillation (HCC)    CHF (congestive heart failure) (HCC)    COPD (chronic obstructive pulmonary disease) ([place])    Emphysema [[name]]   Depression    Diabetes mellitus without complication ([place])    type 2   Emphysema of lung ([place])    GERD (gastroesophageal reflux disease)    HIV disease ([place])    Hypertension    Oxygen deficiency     Significant Hospital Events: Including procedures, antibiotic start and stop dates in addition to other pertinent events   11/7- VV ECMO started 11/9 - weaning sedation. Transfused 2 units PRBC to keep O2 carrying capacity.  11/9 - remains agitated with wake up  11/10 - tracheostomy tube placed 11/14 - trach exchanged to distal XLT due to cuff leak 11/17  - weaned sweep down to 1.5lpm prior to developing melena 11/17  - EGD bleeding duodenal bulb AVM injected and cauterized. Duodenal bulb ischemia. 11/19 stool became maroon.  No improvement in hemoglobin despite 4 units of blood.  Increasing vasopressor requirements.  Despite this, tolerated sweep trial. 11/20 off bivalirudin, maroon stools have resolved.  Repeat endoscopy shows no active signs of bleeding.   11/21 Decannulated from ECMO 11/22 Continues to have recurrent bleeding.   Interim History / Subjective:   Critically ill, intubated on support.  Objective   Blood pressure 140/62, pulse (!) 117, temperature 99.2 F (37.3 C), temperature source Oral, resp. rate (!) 32, height 5\' 9"  (1.753 m), weight 128.9 kg, SpO2 (!) 89  %. CVP:  [11 mmHg-47 mmHg] 47 mmHg  Vent Mode: PRVC FiO2 (%):  [60 %-100 %] 80 % Set Rate:  [18 bmp-32 bmp] 32 bmp Vt Set:  [560 mL] 560 mL PEEP:  [8 cmH20-12 cmH20] 8 cmH20 Pressure Support:  [18 cmH20] 18 cmH20 Plateau Pressure:  [23 cmH20-31 cmH20] 29 cmH20   Intake/Output Summary (Last 24 hours) at 07/04/2021 1108 Last data filed at 07/04/2021 1000 Gross per 24 hour  Intake 2970.26 ml  Output 1950 ml  Net 1020.26 ml   Filed Weights   06/30/2021 0500 07/10/2021 1012 07/04/21 0500  Weight: 118.3 kg 118.3 kg 128.9 kg    Examination: General: critically ill, intubated on support HEENT: NCAT  Neck: trach in place Resp:  BL vented breaths  Cardio: RRR, s1 s2  GI: distended, soft nd  Neuro: RASS -4, sedated on continuous infusion and paralysis  Extremities: edema  GU: foley  Derm: BL LE edema   Ancillary tests:    CXR reviewed  Labs reviewed  ABG remains resp acidosis   Assessment & Plan:   Critically ill  due to acute on chronic hypoxic and hypercapnic respiratory failure from influenza pneumonia on background of severe COPD, prior tracheostomy. On HOT 10 years. Acute COPD exacerbation; severe underlying COPD- Pseudomonas and MSSA HAP Now trach dependent Concern for undiagnosed OSA Acute metabolic encephalopathy with severe agitation HIV, undetectable VL Distributive and hemorrhagic shock due to sedation Acute blood loss anemia due to acute bleeding duodenal AVM Chronic  Afib w/RVR H/o HFpEF GERD Type 2 diabetes with hyperglycemia Hypernatremia from high insensitive losses Deconditioning, acute on chronic  Plan:   Continues to decline overnight  Difficulty ventilating, required additional paralysis, continuous nimbex  He is maxed on multiple pressors Discussed with nursing and palliative care Overall I do not think this is survivable.  We need to have clear GOC discussions with family  I think he should be a DNR.  Palliative care team is meeting with family  this AM.  Continue max support at this time.  I suspect he will declare himself in the short term despite max support   Best Practice (right click and "Reselect all SmartList Selections" daily)   Diet/type: tubefeeds  DVT prophylaxis: bivalirudin GI prophylaxis: PPI Lines: Central line and Arterial Line Foley:  external catheter Code Status:  full code Last date of multidisciplinary goals of care discussion: continue for family updates   This patient is critically ill with multiple organ system failure; which, requires frequent high complexity decision making, assessment, support, evaluation, and titration of therapies. This was completed through the application of advanced monitoring technologies and extensive interpretation of multiple databases. During this encounter critical care time was devoted to patient care services described in this note for 32 minutes.  Josephine Igo, DO [facility] 07/04/2021 11:08 AM

## 2021-07-04 NOTE — Progress Notes (Signed)
[facility] Progress Note  CC:  GI bleed  Assessment / Plan: Persistent melena with progressive GI blood loss anemia continuing to require additional PRBCs with associated hemodynamic instability requiring pressor support. EGD 06/15/2021 showed a briskly ooxing AVM in the duodenal bulb treate with APC. EGD 06/12/2021 showed no active or recent bleeding. No source identified on CTA 07/02/21 although focal intraluminal thickening of proximal SMA. EGD with placement of capsule yesterday revealed no active bleeding including no bleeding from the duodenal bulb in the area of prior AVM bleeding.  Awaiting capsule results tomorrow.  In the meantime: - Awaiting capsule endoscopy results. Anticipate results available tomorrow. - I would consider a trial of octreotide while we are waiting for the results - Continue serial hgb/hct with transfusion as necessary - Eliquis remains on hold - Consider CTA with increase in volume of overt bleeding before that time  Subjective: Reviewed with nursing staff at bedside. No family present. Flexiseal output is melena.   Objective:  Vital signs in last 24 hours: Temp:  [99.5 F (37.5 C)-105 F (40.6 C)] 99.8 F (37.7 C) (11/24 0746) Pulse Rate:  [117-118] 117 (11/24 0849) Resp:  [23-35] 32 (11/24 0849) BP: (140)/(62) 140/62 (11/24 0849) SpO2:  [89 %-100 %] 99 % (11/24 0849) Arterial Line BP: (64-150)/(38-66) 150/66 (11/24 0746) FiO2 (%):  [60 %-100 %] 80 % (11/24 0849) Weight:  [118.3 kg-128.9 kg] 128.9 kg (11/24 0500) Last BM Date: 07/04/21 General:   Intubated-trached , sedated, ill-appearing Heart:  Regular rate; no murmurs Pulm: Steady breathing on the vent, upper resp vent sounds Abdomen:  Protuberent but soft. Obese. Nontender. Nondistended. Hypoactive bowel sounds.  Flexiseal output is liquid black.  Extremities:  Lower extremity edema. Neurologic:  Sedated. Unresponsive.  Intake/Output from previous day: 11/23 0701 - 11/24  0700 In: 2986.9 [I.V.:1942.5; Blood:337.5; NG/GT:70; IV Piggyback:636.9] Out: 3650 [Urine:3650] Intake/Output this shift: No intake/output data recorded.  Lab Results: Recent Labs    06/18/2021 2005 06/22/2021 2031 06/21/2021 2346 07/04/21 0137 07/04/21 0415  WBC 9.5  --  9.0  --  10.2  HGB 6.7*   < > 6.2* 6.8* 7.2*  HCT 23.2*   < > 22.7* 20.0* 25.1*  PLT 112*  --  105*  --  103*   < > = values in this interval not displayed.   BMET Recent Labs    06/16/2021 2005 06/15/2021 2031 06/22/2021 2346 07/04/21 0137 07/04/21 0415  NA 153*   < > 151* 150* 148*  K 5.5*   < > 6.0* 5.9* 5.9*  CL 111  --  112*  --  109  CO2 36*  --  34*  --  31  GLUCOSE 128*  --  169*  --  174*  BUN 135*  --  139*  --  147*  CREATININE 3.02*  --  3.30*  --  3.78*  CALCIUM 8.2*  --  7.8*  --  7.7*   < > = values in this interval not displayed.   LFT Recent Labs    07/04/21 0415  PROT 6.3*  ALBUMIN <1.5*  AST 127*  ALT 88*  ALKPHOS 32*  BILITOT 0.8  BILIDIR 0.1  IBILI 0.7   PT/INR Recent Labs    06/18/2021 0248 07/04/21 0440  LABPROT 15.6* 16.6*  INR 1.2 1.3*     DG Abd Portable 1V  Result Date: 06/24/2021 CLINICAL DATA:  Feeding tube placement EXAM: PORTABLE ABDOMEN - 1 VIEW COMPARISON:  07/04/2021 FINDINGS: Feeding tube passes  through the stomach with the tip near the gastric antrum. Normal bowel gas pattern. IMPRESSION: Feeding tube tip in the region the gastric antrum. Electronically Signed   By: Franchot Gallo M.D.   On: 06/18/2021 12:24      LOS: 21 days   Thornton Park  07/04/2021, 9:35 AM

## 2021-07-04 NOTE — Progress Notes (Signed)
Per Dr. Tonia Brooms PO oxycontin, dilaudid and seroquel may be held since patient is on drip Versed and dilaudid.

## 2021-07-04 NOTE — Progress Notes (Signed)
Pharmacy Antibiotic Note  Chris [name]. is a 55 y.o. male admitted on 06/26/2021 now s/p ECMO. Pharmacy has been consulted for vancomycin and meropenem dosing. Tmax 105. Cr has worsened dramatically overnight.  Plan: Adjust meropenem to 1g IV q12h Hold further vancomycin - will plan for VR 48h after load   Height: 5\' 9"  (175.3 cm) Weight: 128.9 kg (284 lb 2.8 oz) IBW/kg (Calculated) : 70.7  Temp (24hrs), Avg:101.5 F (38.6 C), Min:99.5 F (37.5 C), Max:105 F (40.6 C)  Recent Labs  Lab 06/29/21 0337 06/29/21 1411 07/27/21 0351 07/27/21 1553 07/02/21 1659 07/02/2021 0248 06/22/2021 2005 06/14/2021 2346 07/04/21 0130 07/04/21 0415  WBC 15.1*   < > 15.9*   < > 8.9 7.6 9.5 9.0  --  10.2  CREATININE 1.64*   < > 1.73*   < > 2.00* 2.04* 3.02* 3.30*  --  3.78*  LATICACIDVEN 0.8  --  0.9  --   --   --   --  0.7 0.6 0.8   < > = values in this interval not displayed.     Estimated Creatinine Clearance: 29.4 mL/min (A) (by C-G formula based on SCr of 3.78 mg/dL (H)).    Allergies  Allergen Reactions   Bactrim [Sulfamethoxazole-Trimethoprim] Hives    Antimicrobials this admission: Unasyn 11/6 >> 11/7 Cefepime 11/8 >> 11/21 Vancomycin 11/7 >> 11/9; x 11/17; 11/23 >>  Meropenem 11/23 >>   Dose adjustments this admission:  Microbiology results: 11/17 bcx: negative  11/17 TA: pseudomonas (pan sensitive) 11/13 Sputum: few PsA - pan sensitive 11/7 Sputum: Staph aureus, Pseudomonas 11/3, 11/7, 11/14 MRSA PCR: negative 11/3 Resp panel: Influenza A positive  Thank you for allowing pharmacy to be a part of this patient's care.  13/3, PharmD, BCPS, [facility] Clinical Pharmacist [phone] Please check AMION for all [facility] Pharmacy numbers 07/04/2021

## 2021-07-04 NOTE — Plan of Care (Signed)
  Problem: Education: Goal: Knowledge of General Education information will improve Description: Including pain rating scale, medication(s)/side effects and non-pharmacologic comfort measures Outcome: Progressing   Problem: Health Behavior/Discharge Planning: Goal: Ability to manage health-related needs will improve Outcome: Progressing   Problem: Clinical Measurements: Goal: Ability to maintain clinical measurements within normal limits will improve Outcome: Progressing Goal: Will remain free from infection Outcome: Progressing Goal: Diagnostic test results will improve Outcome: Progressing Goal: Respiratory complications will improve Outcome: Progressing Goal: Cardiovascular complication will be avoided Outcome: Progressing   Problem: Activity: Goal: Risk for activity intolerance will decrease Outcome: Progressing   Problem: Nutrition: Goal: Adequate nutrition will be maintained Outcome: Progressing   Problem: Coping: Goal: Level of anxiety will decrease Outcome: Progressing   Problem: Elimination: Goal: Will not experience complications related to bowel motility Outcome: Progressing Goal: Will not experience complications related to urinary retention Outcome: Progressing   Problem: Pain Managment: Goal: General experience of comfort will improve Outcome: Progressing   Problem: Safety: Goal: Ability to remain free from injury will improve Outcome: Progressing   Problem: Skin Integrity: Goal: Risk for impaired skin integrity will decrease Outcome: Progressing   Problem: Safety: Goal: Non-violent Restraint(s) Outcome: Progressing   Problem: Activity: Goal: Ability to tolerate increased activity will improve Outcome: Progressing   Problem: Respiratory: Goal: Ability to maintain a clear airway and adequate ventilation will improve Outcome: Progressing   Problem: Role Relationship: Goal: Method of communication will improve Outcome: Progressing   

## 2021-07-05 DIAGNOSIS — R0902 Hypoxemia: Secondary | ICD-10-CM | POA: Diagnosis not present

## 2021-07-05 DIAGNOSIS — I4891 Unspecified atrial fibrillation: Secondary | ICD-10-CM | POA: Diagnosis not present

## 2021-07-05 DIAGNOSIS — E119 Type 2 diabetes mellitus without complications: Secondary | ICD-10-CM | POA: Diagnosis not present

## 2021-07-05 DIAGNOSIS — R579 Shock, unspecified: Secondary | ICD-10-CM

## 2021-07-05 DIAGNOSIS — B20 Human immunodeficiency virus [HIV] disease: Secondary | ICD-10-CM

## 2021-07-05 DIAGNOSIS — K922 Gastrointestinal hemorrhage, unspecified: Secondary | ICD-10-CM | POA: Diagnosis not present

## 2021-07-05 DIAGNOSIS — J9601 Acute respiratory failure with hypoxia: Secondary | ICD-10-CM | POA: Diagnosis not present

## 2021-07-05 DIAGNOSIS — R0689 Other abnormalities of breathing: Secondary | ICD-10-CM | POA: Diagnosis not present

## 2021-07-05 DIAGNOSIS — Z66 Do not resuscitate: Secondary | ICD-10-CM

## 2021-07-05 DIAGNOSIS — Z9281 Personal history of extracorporeal membrane oxygenation (ECMO): Secondary | ICD-10-CM | POA: Diagnosis not present

## 2021-07-05 DIAGNOSIS — J101 Influenza due to other identified influenza virus with other respiratory manifestations: Secondary | ICD-10-CM | POA: Diagnosis not present

## 2021-07-05 DIAGNOSIS — J441 Chronic obstructive pulmonary disease with (acute) exacerbation: Secondary | ICD-10-CM | POA: Diagnosis not present

## 2021-07-05 DIAGNOSIS — R69 Illness, unspecified: Secondary | ICD-10-CM | POA: Diagnosis not present

## 2021-07-05 LAB — BASIC METABOLIC PANEL
Anion gap: 10 (ref 5–15)
BUN: 171 mg/dL — ABNORMAL HIGH (ref 6–20)
CO2: 25 mmol/L (ref 22–32)
Calcium: 7.8 mg/dL — ABNORMAL LOW (ref 8.9–10.3)
Chloride: 109 mmol/L (ref 98–111)
Creatinine, Ser: 4.73 mg/dL — ABNORMAL HIGH (ref 0.61–1.24)
GFR, Estimated: 14 mL/min — ABNORMAL LOW (ref >60)
Glucose, Bld: 183 mg/dL — ABNORMAL HIGH (ref 70–99)
Potassium: 5.2 mmol/L — ABNORMAL HIGH (ref 3.5–5.1)
Sodium: 144 mmol/L (ref 135–145)

## 2021-07-05 LAB — HEPATIC FUNCTION PANEL
ALT: 66 U/L — ABNORMAL HIGH (ref 0–44)
AST: 67 U/L — ABNORMAL HIGH (ref 15–41)
Albumin: <1.5 g/dL — ABNORMAL LOW (ref 3.5–5.0)
Alkaline Phosphatase: 41 U/L (ref 38–126)
Bilirubin, Direct: <0.1 mg/dL (ref 0.0–0.2)
Total Bilirubin: 0.8 mg/dL (ref 0.3–1.2)
Total Protein: 6 g/dL — ABNORMAL LOW (ref 6.5–8.1)

## 2021-07-05 LAB — LACTATE DEHYDROGENASE: LDH: 263 U/L — ABNORMAL HIGH (ref 98–192)

## 2021-07-05 LAB — POCT I-STAT 7, (LYTES, BLD GAS, ICA,H+H)
Acid-Base Excess: 0 mmol/L (ref 0.0–2.0)
Bicarbonate: 27.8 mmol/L (ref 20.0–28.0)
Calcium, Ion: 1.12 mmol/L — ABNORMAL LOW (ref 1.15–1.40)
HCT: 19 % — ABNORMAL LOW (ref 39.0–52.0)
Hemoglobin: 6.5 g/dL — CL (ref 13.0–17.0)
O2 Saturation: 95 %
Patient temperature: 99.3
Potassium: 5.2 mmol/L — ABNORMAL HIGH (ref 3.5–5.1)
Sodium: 147 mmol/L — ABNORMAL HIGH (ref 135–145)
TCO2: 30 mmol/L (ref 22–32)
pCO2 arterial: 69.5 mmHg (ref 32.0–48.0)
pH, Arterial: 7.213 — ABNORMAL LOW (ref 7.350–7.450)
pO2, Arterial: 99 mmHg (ref 83.0–108.0)

## 2021-07-05 LAB — FIBRINOGEN: Fibrinogen: 628 mg/dL — ABNORMAL HIGH (ref 210–475)

## 2021-07-05 LAB — GLUCOSE, CAPILLARY
Glucose-Capillary: 143 mg/dL — ABNORMAL HIGH (ref 70–99)
Glucose-Capillary: 149 mg/dL — ABNORMAL HIGH (ref 70–99)
Glucose-Capillary: 153 mg/dL — ABNORMAL HIGH (ref 70–99)
Glucose-Capillary: 159 mg/dL — ABNORMAL HIGH (ref 70–99)
Glucose-Capillary: 165 mg/dL — ABNORMAL HIGH (ref 70–99)
Glucose-Capillary: 168 mg/dL — ABNORMAL HIGH (ref 70–99)
Glucose-Capillary: 169 mg/dL — ABNORMAL HIGH (ref 70–99)
Glucose-Capillary: 180 mg/dL — ABNORMAL HIGH (ref 70–99)

## 2021-07-05 LAB — PROTIME-INR
INR: 1.3 — ABNORMAL HIGH (ref 0.8–1.2)
Prothrombin Time: 16.2 seconds — ABNORMAL HIGH (ref 11.4–15.2)

## 2021-07-05 MED ORDER — HYDROMORPHONE BOLUS VIA INFUSION
4.0000 mg | INTRAVENOUS | Status: DC | PRN
  Administered 2021-07-05: 4 mg via INTRAVENOUS
  Filled 2021-07-05: qty 4

## 2021-07-05 MED ORDER — DEXTROSE 5 % IV SOLN: INTRAVENOUS | Status: DC

## 2021-07-05 MED ORDER — GLYCOPYRROLATE 1 MG PO TABS
1.0000 mg | ORAL_TABLET | ORAL | Status: DC | PRN
  Filled 2021-07-05: qty 1

## 2021-07-05 MED ORDER — MIDAZOLAM BOLUS VIA INFUSION
4.0000 mg | INTRAVENOUS | Status: DC | PRN
  Administered 2021-07-05: 4 mg via INTRAVENOUS
  Filled 2021-07-05: qty 4

## 2021-07-05 MED ORDER — ACETAMINOPHEN 650 MG RE SUPP: 650.0000 mg | Freq: Four times a day (QID) | RECTAL | Status: DC | PRN

## 2021-07-05 MED ORDER — ACETAMINOPHEN 325 MG PO TABS: 650.0000 mg | ORAL_TABLET | Freq: Four times a day (QID) | ORAL | Status: DC | PRN

## 2021-07-05 MED ORDER — DIPHENHYDRAMINE HCL 50 MG/ML IJ SOLN: 25.0000 mg | INTRAMUSCULAR | Status: DC | PRN

## 2021-07-05 MED ORDER — GLYCOPYRROLATE 0.2 MG/ML IJ SOLN: 0.2000 mg | INTRAMUSCULAR | Status: DC | PRN

## 2021-07-05 MED ORDER — POLYVINYL ALCOHOL 1.4 % OP SOLN: 1.0000 [drp] | Freq: Four times a day (QID) | OPHTHALMIC | Status: DC | PRN

## 2021-07-06 LAB — TYPE AND SCREEN
ABO/RH(D): A POS
Antibody Screen: NEGATIVE
Unit division: 0
Unit division: 0
Unit division: 0
Unit division: 0
Unit division: 0
Unit division: 0
Unit division: 0

## 2021-07-06 LAB — BPAM RBC
Blood Product Expiration Date: 202211292359
Blood Product Expiration Date: 202212132359
Blood Product Expiration Date: 202212142359
Blood Product Expiration Date: 202212162359
Blood Product Expiration Date: 202212212359
Blood Product Expiration Date: 202212212359
Blood Product Expiration Date: 202212242359
ISSUE DATE / TIME: 202211211019
ISSUE DATE / TIME: 202211220643
ISSUE DATE / TIME: 202211221414
ISSUE DATE / TIME: 202211232313
Unit Type and Rh: 6200
Unit Type and Rh: 6200
Unit Type and Rh: 6200
Unit Type and Rh: 6200
Unit Type and Rh: 6200
Unit Type and Rh: 6200
Unit Type and Rh: 6200

## 2021-07-06 LAB — CBC
HCT: 22.9 % — ABNORMAL LOW (ref 39.0–52.0)
Hemoglobin: 6.8 g/dL — CL (ref 13.0–17.0)
MCH: 29.6 pg (ref 26.0–34.0)
MCHC: 29.7 g/dL — ABNORMAL LOW (ref 30.0–36.0)
MCV: 99.6 fL (ref 80.0–100.0)
Platelets: 117 10*3/uL — ABNORMAL LOW (ref 150–400)
RBC: 2.3 MIL/uL — ABNORMAL LOW (ref 4.22–5.81)
RDW: 17.2 % — ABNORMAL HIGH (ref 11.5–15.5)
WBC: 11.4 10*3/uL — ABNORMAL HIGH (ref 4.0–10.5)
nRBC: 0.8 % — ABNORMAL HIGH (ref 0.0–0.2)

## 2021-07-07 ENCOUNTER — Encounter (HOSPITAL_COMMUNITY): Payer: Self-pay | Admitting: Gastroenterology

## 2021-07-08 ENCOUNTER — Encounter: Payer: Self-pay | Admitting: *Deleted

## 2021-07-09 ENCOUNTER — Other Ambulatory Visit: Payer: Medicare HMO | Admitting: *Deleted

## 2021-07-09 LAB — GLUCOSE, CAPILLARY: Glucose-Capillary: 29 mg/dL — CL (ref 70–99)

## 2021-07-11 NOTE — Progress Notes (Addendum)
This chaplain responded to the unit's page for an extra level of support for the family.    The chaplain introduced herself to the Pt. wife-Keisha and family and began to build a rapport with the Pt. family.  The PMT NP-Kathryn is talking with the family at this time. The chaplain updated the Pt. RN-Christine and will F/U this afternoon.  Chaplain Stephanie Acre [phone]  **[phone] This chaplain followed up with the Pt. Wife-Keisha as communicated from the earlier visit.    The chaplain offered a spiritual care presence with the opportunity for story telling. The chaplain learned the Pt. and wife have been together for over 30 years.   The chaplain understands Deanna Artis is declining spiritual care visits unless she initiates them through the RN. The chaplain understands Deanna Artis does not have any questions at this time for the Palliative Medicine Team.   Theodosia Paling [phone]

## 2021-07-11 NOTE — Progress Notes (Signed)
Chaplain responded to page requesting support for family at time ventilator was removed.  Chaplain joined family members and prayed at bedside with pt's wife, daughter, grandson and other family members/friends present.  Chaplain standing by for additional support if needed.  Vernell Morgans Chaplain

## 2021-07-11 NOTE — Progress Notes (Signed)
Palliative Care Progress Note, Assessment & Plan   Patient Name: Chris [name].       Date: 2021-07-07 DOB: 06/20/1966  Age: 55 y.o. MRN#: [id number] Attending Physician: Garner Nash, DO Primary Care Physician: [name], NP Admit Date: 06/12/2021  Reason for Consultation/Follow-up: Establishing goals of care  Subjective: Patient is lying in bed sedated and ventilated.  Family is at bedside.  HPI: Patient is a 55 year old male with a past medical history significant for A. fib, CHF, HTN, severe COPD, history of ARDS requiring trach, type 2 diabetes, and HIV who was admitted for fever, shortness of breath, and worsening cough for the last 3 days.  Patient was found to be positive for influenza A.  Patient was intubated on 11/3, cannulated for initiation of ECMO on 11/7, trach placed on 11/10, and has had recurrent bleeding with severe drops in hemoglobin.  Patient status continues to decline and palliative medicine was consulted to discuss goals of care.  Summary of counseling/coordination of care: After reviewing the patient's medical records, epic notes, labs, and imaging, met with the patient, patient's wife, other family members, and Dr. Valeta Harms at bedside to discuss care.  Dr. Valeta Harms provided a brief medical review.  Multisystem organ failure outlined and described in detail.  Dr. Valeta Harms shared that the patient worsened overnight and reiterated that he is on multiple medications to ensure he was alive long enough to see his family today.   Varney Biles was appropriately tearful.  She asked about the GI consults and what the results of the endoscopy were.  Dr. Valeta Harms and I both shared that while the results of the camera study are not known that it is not as significant as his heart, kidney, and  breathing issues.  Reiterated significant of patient's need for full ventilator support and multiple medications to keep blood pressure within normal limits.  Therapeutic silence and space to share her emotions and offer support given.  In an attempt not to overwhelm [facility] both Dr. Valeta Harms and I discussed what the patient's end-of-life wishes might be.  Discussed having him pass away on a ventilator versus freeing him from machines and allowing a natural death.  Symptom management at end-of-life was reviewed.  Varney Biles shares she understands, has no questions or concerns, and excused herself from the room because she said she needed more time to speak with family.  No changes in decisions at this time.  Spiritual care chaplain Dorian Pod also visited with the patient after our discussion.  Varney Biles shared she has no more palliative care spiritual care needs at this time.  Palliative medicine will continue to follow patient throughout his hospitalization but not intervene unless called.   Code Status: DNR  Prognosis: Hours - Days  Discharge Planning: Anticipated Hospital Death  Recommendations/Plan: Family support  Care plan was discussed with patient's wife, Dr. Valeta Harms, RN Altha Harm, and Chaplain ellen  Physical Exam Constitutional:      Appearance: He is ill-appearing and toxic-appearing.  HENT:     Head: Normocephalic and atraumatic.  Cardiovascular:     Rate and Rhythm: Normal rate.  Pulmonary:     Comments: MV Abdominal:     Palpations: Abdomen is soft.  Musculoskeletal:     Comments: Generalized weakness, bedbound  Skin:    General: Skin is warm and dry.  Neurological:     Comments: Nonresponsive to verbal stimuli               Total Time 25 minutes  Greater than 50%  of this time was spent counseling and coordinating care related to the above assessment and plan.  Thank you for allowing the Palliative Medicine Team to assist in the care of this patient.  Cavalier Ilsa Iha, FNP-BC Palliative Medicine Team Team Phone # [phone]

## 2021-07-11 NOTE — Progress Notes (Addendum)
Progress Note Hospital Day: 23  Chief Complaint:   GI bleed      ASSESSMENT AND PLAN   Brief history: 55 yo male with HTN, CHF, Afib, 02 dependent COPD, DM HIV, GERD, candida esophagitis. He has had a prolonged hospitalization for  acute on chronic respiratory failure requiring intubation, influenza PNA.  We having been following him this admission for melena / ABL anemia requiring multiple transfusions.     # GI bleed with anemia requiring 21 uPRBC this admission. Source of bleed not yet determined.  --Actively bleeding duodenal AVM s/p epi injection and APC on 11/17. Also suggestion of ischemia involving second portion of duodenum. --Required repeat EGD on 11/20 for persistent melena. Small old appearing blood clot in stomach. No active bleeding or unexpected findings.  --07/01/21 CTA for persistent melena / hemodynamic instability / progressive anemia. No source of bleeding found.  11/23 EGD for placement of video capsule study. EGD >> patchy duodenal erythema. No blood present.. Awaiting capsule results.  --Last unit of blood on 11/23. Hgb slowly declining again 7.5 >> 6.8 >> 6.5 but no further GI bleeding per RN.   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 No longer having active GI bleeding. PCCM note read, looks like patient may be transitioned to comfort care  # Severe ARDS secondary to acute on chronic respiratory failure in setting of influenza pneumonia.  --Patient is critically ill. On maximum pressors. Very little urine output ( on lasix). Palliative Care has met with family, patient made DNR.      OBJECTIVE      Scheduled inpatient medications:   arformoterol  15 mcg Nebulization BID   artificial tears  1 application Both Eyes T4H   budesonide (PULMICORT) nebulizer solution  0.25 mg Nebulization BID   chlorhexidine gluconate (MEDLINE KIT)  15 mL Mouth Rinse BID   Chlorhexidine Gluconate Cloth  6 each  Topical Daily   clonazePAM  1.5 mg Per Tube TID   collagenase   Topical Daily   emtricitabine-tenofovir AF  1 tablet Per Tube Daily   And   dolutegravir  50 mg Per Tube Daily   feeding supplement (PROSource TF)  45 mL Per Tube TID   free water  300 mL Per Tube Q4H   furosemide  80 mg Intravenous TID   heparin injection (subcutaneous)  5,000 Units Subcutaneous Q8H   HYDROmorphone  2 mg Per Tube Q6H   ipratropium-albuterol  3 mL Nebulization Q4H   mouth rinse  15 mL Mouth Rinse 10 times per day   methylPREDNISolone (SOLU-MEDROL) injection  120 mg Intravenous Q12H   midodrine  10 mg Per Tube TID WC   nutrition supplement (JUVEN)  1 packet Per Tube BID BM   oxyCODONE  15 mg Per Tube Q6H   pantoprazole (PROTONIX) IV  40 mg Intravenous Q12H   polyethylene glycol  17 g Per Tube BID   QUEtiapine  100 mg Per Tube BID   revefenacin  175 mcg Nebulization Daily   rosuvastatin  20 mg Per Tube Daily   senna-docusate  1 tablet Per Tube BID   sodium chloride flush  10-40 mL Intracatheter Q12H   sodium chloride flush  3 mL Intravenous Q12H   valproic acid  250 mg Per Tube TID   vancomycin variable dose per unstable renal function (pharmacist dosing)   Does not apply See admin instructions   Continuous inpatient infusions:  sodium chloride     sodium chloride     amiodarone 30 mg/hr (12-Jul-2021 1100)   cisatracurium (NIMBEX) infusion 2 mg/mL Stopped (July 12, 2021 0740)   dexmedetomidine (PRECEDEX) IV infusion Stopped (07/04/2021 1630)   dextrose 250 mL (07/04/21 1858)   HYDROmorphone 4 mg/hr (July 12, 2021 1100)   insulin 5.5 Units/hr (12-Jul-2021 1100)   meropenem (MERREM) IV 1 g (07/12/21 1100)   midazolam 10 mg/hr (July 12, 2021 1100)   norepinephrine (LEVOPHED) Adult infusion 44 mcg/min (2021-07-12 1100)   phenylephrine (NEO-SYNEPHRINE) Adult infusion 50 mcg/min (07/12/2021 1148)   vasopressin 0.04 Units/min (Jul 12, 2021 1100)   PRN inpatient medications: sodium chloride, Place/Maintain arterial line **AND**  sodium chloride, acetaminophen **OR** acetaminophen, albuterol, bisacodyl, dextrose, docusate, HYDROmorphone, lip balm, midazolam, midazolam, ondansetron **OR** ondansetron (ZOFRAN) IV, sodium chloride flush, sodium chloride flush  Vital signs in last 24 hours: Temp:  [99.6 F (37.6 C)-100 F (37.8 C)] 99.7 F (37.6 C) (11/24 2000) Pulse Rate:  [110-132] 126 (11/25 0351) Resp:  [21-35] 32 (11/25 1200) BP: (124)/(57) 124/57 (11/24 1524) SpO2:  [96 %-100 %] 99 % (11/25 1200) Arterial Line BP: (83-177)/(43-70) 105/44 (11/25 1200) FiO2 (%):  [100 %] 100 % (11/25 1150) Weight:  [135.6 kg] 135.6 kg (11/25 0500) Last BM Date: 07/04/21  Intake/Output Summary (Last 24 hours) at 07/12/21 1212 Last data filed at 2021-07-12 1100 Gross per 24 hour  Intake 2600.18 ml  Output 222 ml  Net 2378.18 ml     Physical Exam:  General: Sedated male with trach on vent.  Regular rate and rhythm.  Pulmonary: tracheostomy , on vent.  Abdomen: Soft, nondistended, a few bowel sounds   Neurologic: tries to open eyes   Filed Weights   07/09/2021 1012 07/04/21 0500 07-12-2021 0500  Weight: 118.3 kg 128.9 kg 135.6 kg    Intake/Output from previous day: 11/24 0701 - 11/25 0700 In: 3119.5 [I.V.:2668.9; NG/GT:200; IV Piggyback:250.6] Out: 222 [Urine:112; Stool:110] Intake/Output this shift: Total I/O In: 361.3 [I.V.:361.3] Out: 60 [Urine:60]    Lab Results: Recent Labs    07/04/21 0959 07/04/21 1007 07/04/21 1700 07/04/21 1835 07/12/2021 0045 2021/07/12 0639  WBC 10.4  --  10.4  --  11.4*  --   HGB 7.5*   < > 7.0* 7.5* 6.8* 6.5*  HCT 25.7*   < > 24.2* 22.0* 22.9* 19.0*  PLT 119*  --  115*  --  117*  --    < > = values in this interval not displayed.   BMET Recent Labs    07/04/21 0959 07/04/21 1007 07/04/21 1700 07/04/21 1835 07/04/21 1949 2021/07/12 0045 2021/07/12 0639  NA 148*   < > 146* 147*  --  144 147*  K 5.7*   < > 5.6* 5.8*  --  5.2* 5.2*  CL 111  --  109  --   --  109  --    CO2 29  --  29  --   --  25  --   GLUCOSE 223*  --  184*  --  257* 183*  --   BUN 151*  --  163*  --   --  171*  --   CREATININE 4.02*  --  4.36*  --   --  4.73*  --   CALCIUM 7.9*  --  7.7*  --   --  7.8*  --    < > = values in this interval not displayed.   LFT Recent Labs    July 12, 2021 0045  PROT 6.0*  ALBUMIN <1.5*  AST 67*  ALT 66*  ALKPHOS 41  BILITOT 0.8  BILIDIR <0.1  IBILI NOT CALCULATED   PT/INR Recent Labs    07/04/21 0440 30-Jul-2021 0045  LABPROT 16.6* 16.2*  INR 1.3* 1.3*   Hepatitis Panel No results for input(s): HEPBSAG, HCVAB, HEPAIGM, HEPBIGM in the last 72 hours.  DG Abd Portable 1V  Result Date: 06/29/2021 CLINICAL DATA:  Feeding tube placement EXAM: PORTABLE ABDOMEN - 1 VIEW COMPARISON:  07/10/2021 FINDINGS: Feeding tube passes through the stomach with the tip near the gastric antrum. Normal bowel gas pattern. IMPRESSION: Feeding tube tip in the region the gastric antrum. Electronically Signed   By: Franchot Gallo M.D.   On: 06/20/2021 12:24        Principal Problem:   Influenza A Active Problems:   HIV disease ([place])   Essential hypertension, benign   COPD with acute exacerbation (HCC)   Atrial fibrillation with RVR (HCC)   Acute and chronic respiratory failure with hypoxia    Diabetes mellitus type 2, insulin dependent ([place])   Acute on chronic respiratory failure ([place])   Gastrointestinal hemorrhage     LOS: 22 days   Tye Savoy ,NP 2021-07-30, 12:12 PM

## 2021-07-11 NOTE — Progress Notes (Signed)
PCCM Goals of Care Discussion and Advanced Care Planning:   Date: 06/21/2021   Present Parties: wife, sister  What was discussed: current medical problems, comorbidities, multiple failing organ systems, profound shock state, renal failure, continued vent support on max support settings.   I explained that I expected him to pass despite maximal support and that we should be considering transition to comfort care measures soon.   Outcome:   Family are going to talk and made arrangements Remains a DNR  We appreciate the palliative care team input.   22 mins of time was spent discussing the goals of care, advanced care planning options such as code status as well as do not resuscitate forms.

## 2021-07-11 NOTE — Progress Notes (Addendum)
NAME:  Ammar Moffatt., MRN:  [id number], DOB:  07-May-1966, LOS: 59 ADMISSION DATE:  06/28/2021, CONSULTATION DATE:  06/15/2019 REFERRING MD:  Ruthann Cancer, CHIEF COMPLAINT:  hypercapnic respiratory failure   History of Present Illness:   55 year old man with O2 dependent COPD presenting with acute on chronic respiratory failure now on VV ECMO due to refractory hypoxia and hypercapnia.  Pertinent  Medical History   Past Medical History:  Diagnosis Date   Anxiety    Atrial fibrillation (HCC)    CHF (congestive heart failure) (HCC)    COPD (chronic obstructive pulmonary disease) ([place])    Emphysema [[name]]   Depression    Diabetes mellitus without complication ([place])    type 2   Emphysema of lung ([place])    GERD (gastroesophageal reflux disease)    HIV disease ([place])    Hypertension    Oxygen deficiency     Significant Hospital Events: Including procedures, antibiotic start and stop dates in addition to other pertinent events   11/7- VV ECMO started 11/9 - weaning sedation. Transfused 2 units PRBC to keep O2 carrying capacity.  11/9 - remains agitated with wake up  11/10 - tracheostomy tube placed 11/14 - trach exchanged to distal XLT due to cuff leak 11/17  - weaned sweep down to 1.5lpm prior to developing melena 11/17  - EGD bleeding duodenal bulb AVM injected and cauterized. Duodenal bulb ischemia. 11/19 stool became maroon.  No improvement in hemoglobin despite 4 units of blood.  Increasing vasopressor requirements.  Despite this, tolerated sweep trial. 11/20 off bivalirudin, maroon stools have resolved.  Repeat endoscopy shows no active signs of bleeding.   11/21 Decannulated from ECMO 11/22 Continues to have recurrent bleeding.  11/25 palliative care met with family yesterday and decision made to change CODE STATUS to DNR.  Patient remains critically ill in multisystem organ dysfunction.  We will continue to discuss goals of care with family  Interim History / Subjective:    Remains critically ill on max support.  Objective   Blood pressure (!) 124/57, pulse (!) 126, temperature 99.7 F (37.6 C), temperature source Oral, resp. rate (!) 32, height _0  (1.753 m), weight 135.6 kg, SpO2 99 %. CVP:  [13 mmHg-47 mmHg] 24 mmHg  Vent Mode: PRVC FiO2 (%):  [80 %-100 %] 100 % Set Rate:  [32 bmp] 32 bmp Vt Set:  [560 mL] 560 mL PEEP:  [8 cmH20] 8 cmH20 Plateau Pressure:  [24 cmH20-29 cmH20] 24 cmH20   Intake/Output Summary (Last 24 hours) at Aug 02, 2021 5456 Last data filed at 08-02-21 0400 Gross per 24 hour  Intake 2760.68 ml  Output 222 ml  Net 2538.68 ml    Filed Weights   06/14/2021 1012 07/04/21 0500 2021/08/02 0500  Weight: 118.3 kg 128.9 kg 135.6 kg    Examination: General: Acute on chronic severely deconditioned middle-aged male lying in bed sedated and paralyzed on ventilator  HEENT: ETT, MM pink/moist, PERRL,  Neuro: Sedated and paralyzed CV: s1s2 regular rate and rhythm, no murmur, rubs, or gallops,  PULM: Bilateral base rhonchi, tolerating ventilator through tracheostomy, no increased work of breathing GI: soft, bowel sounds active in all 4 quadrants, non-tender, non-distended, tolerating TF Extremities: warm/dry, generalized severe 3+ edema  Skin: no rashes or lesions  Ancillary tests:   CXR reviewed  Labs reviewed  ABG remains resp acidosis   Assessment & Plan:  Severe ARDS secondary to acute on chronic hypoxic and hypercapnic respiratory failure in the setting of influenza  pneumonia superimposed arterial pneumonia with Pseudomonas and severe COPD -P/F ratio 99 consistent with severe ARDS -VV ECMO decannulation 11/21 Prior tracheostomy. On HOT 10 years. -Tracheostomy reestablish 11/14 Acute COPD exacerbation; severe underlying COPD- Pseudomonas and MSSA HAP -Sputum culture positive for Pseudomonas 11/20 Concern for underlying OSA P: Continue ventilator support with lung protective strategies  Wean PEEP and FiO2 for sats greater  than 90%. Head of bed elevated 30 degrees. Plateau pressures less than 30 cm H20.  Follow intermittent chest x-ray and ABG.   SAT/SBT as tolerated, mentation preclude extubation  Ensure adequate pulmonary hygiene  Follow cultures  VAP bundle in place  PAD protocol Continued goals of care discussion as below  Acute metabolic encephalopathy with severe agitation P: Maintain neuro protective measures; goal for eurothermia, euglycemia, eunatermia, normoxia, and PCO2 goal of 35-40 Nutrition and bowel regiment  Seizure precautions  Aspirations precautions   HIV, undetectable VL P: Supportive care Continue antiviral therapy  Distributive and hemorrhagic shock with underlying hypotension secondary to sedation as well -Hemorrhagic shock likely secondary to upper GI bleed -Upper endoscopy 11/23 negative for acute bleed, capsule study recommended Acute blood loss anemia due to acute bleeding duodenal AVM Thrombocytopenia P: GI consulted and following Await capsule endoscopy results Trend CBC Anticoagulation remains on hold  Shock liver P: LFTs slightly improved compared to day prior Avoid hepatotoxins Supportive care  Anurinc acute renal failure -Creatinine and BUN continue to climb with no urine output P: Given multisystem organ dysfunction patient does not meet criteria for hemodialysis Follow renal function  Monitor urine output Trend Bmet Avoid nephrotoxins Ensure adequate renal perfusion   Chronic Afib w/RVR -Chronically anticoagulated with Eliquis   P: Amiodarone infusion Continuous telemetry  Anticoagulation on hold   H/o HFpEF -ECHO 11/4 EF with 55-60 with normal RV function  P: Continuous telemetry  Strict intake and output  Daily weight to assess volume status Daily assessment for need to diurese with the use of IV lasix  Closely monitor renal function and electrolytes  Ensure hemodynamic control   GERD P: PPI  Type 2 diabetes with  hyperglycemia P: Continue insulin drip  CBG Q4 CBG goal 140-180  Hypernatremia from high insensitive losses P: Trend Bmet   Deconditioning, acute on chronic Severe protein calorie malnutrition -Albumin less than 1.5 P: Continue tube feeds Protein supplementation  Goals of care  Family met with palliative care 11/24 and decision made to transition to DO NOT RESUSCITATE.  We will continue to coordinate with family for goals of care.  Compassionate extubation and comfort care measures are likely indicated given multisystem organ dysfunction and limited quality of life  Best Practice (right click and "Reselect all SmartList Selections" daily)   Diet/type: tubefeeds  DVT prophylaxis: bivalirudin GI prophylaxis: PPI Lines: Central line and Arterial Line Foley:  external catheter Code Status:  full code Last date of multidisciplinary goals of care discussion: continue for family updates   CRITICAL CARE Performed by: Whitney D. Harris  Total critical care time: 40 minutes  Critical care time was exclusive of separately billable procedures and treating other patients.  Critical care was necessary to treat or prevent imminent or life-threatening deterioration.  Critical care was time spent personally by me on the following activities: development of treatment plan with patient and/or surrogate as well as nursing, discussions with consultants, evaluation of patient's response to treatment, examination of patient, obtaining history from patient or surrogate, ordering and performing treatments and interventions, ordering and review of laboratory studies, ordering and  review of radiographic studies, pulse oximetry and re-evaluation of patient's condition.  Whitney D. Kenton Kingfisher, NP-C [facility] Personal contact information can be found on Amion  07-11-21, 7:45 AM   PCCM attending:  This is a 55 year old gentleman with prolonged hospital course.  He complicated  influenza with VV ECMO support.  Now decannulated.  Still had progressive decline Multiorgan failure requiring multiple vasopressors and max vent support.  We had long discussions with family yesterday with palliative care.  At this point there is nothing left to offer I suspect that he is going to pass in the hospital.  We explained this to family yesterday.  We are waiting on family members to arrive for consideration of transfer to comfort care once other family members arrive at the hospital.  BP (!) 124/57   Pulse (!) 126   Temp 99.7 F (37.6 C) (Oral)   Resp (!) 32   Ht _0  (1.753 m)   Wt 135.6 kg   SpO2 100%   BMI 44.15 kg/m   General: Middle-aged male intubated mechanical life support HEENT: NCAT, trach in place Lungs: Bilateral mechanically ventilated breath sounds Heart: Tachycardic regular  Labs: Reviewed No urine output for several hours. Steadily increasing serum creatinine Hyperkalemia  Assessment: Multiorgan failure Acute hypoxemic respiratory failure on mechanical vent support. Shock, requiring multiple vasopressors Acute renal failure Hyperkalemia Shock liver HIV, chronic  Plan: Continue full mechanical vent support Continue vasopressors at current rates He is already maxed on 3 vasopressors I agree that they have made him a DNR. I suspect he will pass in the hospital. Once family arrives we will hopefully be able to transition to comfort care measures.  This patient is critically ill with multiple organ system failure; which, requires frequent high complexity decision making, assessment, support, evaluation, and titration of therapies. This was completed through the application of advanced monitoring technologies and extensive interpretation of multiple databases. During this encounter critical care time was devoted to patient care services described in this note for 32 minutes.   Garner Nash, DO [facility] 07-11-2021 10:03 AM

## 2021-07-11 NOTE — Progress Notes (Signed)
This chaplain responded to the PMT referral for a spiritual care presence if/when needed. Family is not at the bedside. The chaplain understands from the Pt. RNs no needs at this time. Spiritual care remains available as needed.  Chaplain Stephanie Acre

## 2021-07-11 NOTE — Progress Notes (Signed)
RT note- Patient was compassionately removed from ventilator at this time.

## 2021-07-11 NOTE — Progress Notes (Signed)
PCCM:  I met with patients family at bedside.   They are ready to withdraw care.   CCM withdraw of life sustaining treatment orderset placed  Garner Nash, [facility] 07/21/21 4:19 PM

## 2021-07-11 DEATH — deceased

## 2021-08-11 NOTE — Death Summary Note (Signed)
DEATH SUMMARY   Patient Details  Name: Chris [name]. MRN: [id number] DOB: 08/30/1965  Admission/Discharge Information   Admit Date:  07-01-21  Date of Death: Date of Death: 2021-07-23  Time of Death: Time of Death: 1706/10/26  Length of Stay: Oct 20, 2022  Referring Physician: [name], NP   Reason(s) for Hospitalization  56 year old man with a significant history of pre-existing Gold stage III severe COPD for over 10 years on intermittent home oxygen.  More dependent on oxygen recently but apparently good quality of life nonetheless.  Patient is as active as a breathing allows.  HIV disease with undetectable viral load, compliant with HART.  Last CD4 count 350.  Previously admitted 2017/10/26 with ARDS secondary to severe influenza.  Required tracheostomy at that time but was successfully decannulated and returned to baseline level of function.   Presented 07/02/23 with increasing shortness of breath and fevers.  Was initially started on BiPAP and found to be influenza A positive.  Eventually required intubation 2023-07-02.   Consulted for ECMO 11/4 for refractory hypoxia and hypercapnia.  Diagnoses  Preliminary cause of death: Influenza A with pneumonia Secondary Diagnoses (including complications and co-morbidities):  Principal Problem:   Influenza A Active Problems:   HIV disease ([place])   COPD  GOLD III, still smoking   Chronic respiratory failure with hypoxia and hypercapnia (HCC)   Cigarette smoker   Obstructive sleep apnea syndrome   Essential hypertension, benign   COPD with acute exacerbation (HCC)   HIV (human immunodeficiency virus infection) ([place])   Endotracheally intubated   Atrial fibrillation with RVR (HCC)   Acute and chronic respiratory failure with hypoxia    Diabetes mellitus type 2, insulin dependent (HCC)   Acute on chronic respiratory failure [facility])   Gastrointestinal hemorrhage   Brief Hospital Course (including significant findings, care, treatment, and services provided  and events leading to death)  Chris [name]. is a 56 y.o. year old male who with a significant history of pre-existing Gold stage III severe COPD for over 10 years on intermittent home oxygen.  More dependent on oxygen recently but apparently good quality of life nonetheless.  Patient is as active as a breathing allows.  HIV disease with undetectable viral load, compliant with HART.  Last CD4 count 350.  Previously admitted 10/26/2017 with ARDS secondary to severe influenza.  Required tracheostomy at that time but was successfully decannulated and returned to baseline level of function.   Presented 2023/07/02 with increasing shortness of breath and fevers.  Was initially started on BiPAP and found to be influenza A positive.  Eventually required intubation 02-Jul-2023.   Consulted for ECMO 11/4 for refractory hypoxia and hypercapnia.  11/7- VV ECMO started 11/9 - weaning sedation. Transfused 2 units PRBC to keep O2 carrying capacity.  11/9 - remains agitated with wake up  11/10 - tracheostomy tube placed 11/14 - trach exchanged to distal XLT due to cuff leak 11/17  - weaned sweep down to 1.5lpm prior to developing melena 11/17  - EGD bleeding duodenal bulb AVM injected and cauterized. Duodenal bulb ischemia. 11/19 stool became maroon.  No improvement in hemoglobin despite 4 units of blood.  Increasing vasopressor requirements.  Despite this, tolerated sweep trial. 11/20 off bivalirudin, maroon stools have resolved.  Repeat endoscopy shows no active signs of bleeding.   11/21 Decannulated from ECMO 11/22 Continues to have recurrent bleeding.  07-24-2023 palliative care met with family yesterday and decision made to change CODE STATUS to DNR.  Patient  remains critically ill in multisystem organ dysfunction.  We will continue to discuss goals of care with family  Ultimately the patient continued to decline after VV ECMO decannulation.  The patient had multiorgan failure, developed anuria, acute renal failure.  The  decision was made for consideration of transition to comfort care.  Patient had max vasopressor support and continued to decline.  We met with the patient's family discussed neck steps and they made the decision for transition to comfort care.  The patient passed peacefully in the intensive care unit with family at bedside.  Pertinent Labs and Studies  Significant Diagnostic Studies DG Abd 1 View  Result Date: 06/26/2021 CLINICAL DATA:  56 year old male with abdominal distension. On ECMO. EXAM: ABDOMEN - 1 VIEW COMPARISON:  06/23/2021. FINDINGS: Portable AP supine view at 0726 hours. Inferior extent of ECMO cannula visible at the level of the hepatic IVC. Enteric feeding tube courses through the stomach and to the ligament of Treitz as before. There is a small volume of oral contrast at the gastric fundus. Visible bowel gas pattern is within normal limits. Partially visible previous right femur ORIF. No acute osseous abnormality identified. 2 cm linear object again projects over the midline lower pelvis. IMPRESSION: 1. Normal gas pattern. Stable and satisfactory enteric feeding tube. 2. Partially visible ECMO cannula. 2 cm linear foreign body again projects over the midline lower pelvis, unclear significance. Electronically Signed   By: Genevie Ann M.D.   On: 06/26/2021 07:41   DG Abd 1 View  Result Date: 06/23/2021 CLINICAL DATA:  Ileus evaluation. EXAM: ABDOMEN - 1 VIEW COMPARISON:  Radiograph 06/19/2021, 06/16/2021 FINDINGS: Weighted enteric tube tip in the proximal jejunum. Large-bore catheter in the chest extends below the diaphragm in the region of the IVC. No definite gaseous small bowel distension. There is air within mildly prominent transverse and descending colon. Small volume of stool in the rectum. Linear 2.2 cm density projecting over the pelvis just to the left of midline is of unknown significance and etiology. No evidence of free air. IMPRESSION: 1. Nonspecific bowel gas pattern with  mildly prominent air within transverse and descending colon but no definite gaseous small bowel distension to suggest small bowel ileus. 2. A 2.2 cm linear density projecting over the pelvis just to the left of midline is of unknown significance and etiology. This may be external to the patient or related to a catheter. Recommend correlation with physical exam. Electronically Signed   By: Keith Rake M.D.   On: 06/23/2021 15:06   DG Abd 1 View  Result Date: 06/20/2021 CLINICAL DATA:  Feeding tube placement. EXAM: ABDOMEN - 1 VIEW COMPARISON:  None. FINDINGS: The bowel gas pattern is normal. Distal tip of feeding tube is seen in expected position of the stomach. No radio-opaque calculi or other significant radiographic abnormality are seen. IMPRESSION: Distal tip of feeding tube seen in expected position of the stomach. Electronically Signed   By: Marijo Conception M.D.   On: 06/12/2021 15:26   DG Abd 1 View  Result Date: 06/15/2021 CLINICAL DATA:  Feeding tube placed EXAM: ABDOMEN - 1 VIEW COMPARISON:  Abdominal radiograph from earlier today FINDINGS: Enteric tube terminates in the proximal stomach with the side port in the lower thoracic esophagus. Small amount of oral contrast layering in the gastric fundus. Dilated small bowel loops in the visualized upper abdomen, unchanged. No evidence of pneumatosis or pneumoperitoneum. IMPRESSION: Enteric tube terminates in the proximal stomach with the side-port in the lower thoracic  esophagus. Suggest advancing 5 cm. Dilated small bowel loops in the visualized upper abdomen, unchanged, cannot exclude ileus or small-bowel obstruction. Electronically Signed   By: Ilona Sorrel M.D.   On: 06/15/2021 09:32   DG Abd 1 View  Result Date: 06/15/2021 CLINICAL DATA:  Feeding tube placement. EXAM: ABDOMEN - 1 VIEW COMPARISON:  None. FINDINGS: No feeding tube is seen on this exam. A small amount of oral contrast is seen within the fundus of the stomach. No evidence of  dilated bowel loops. Heterogeneous airspace disease seen in the retrocardiac left lower lobe. IMPRESSION: No feeding tube seen on this exam.  Unremarkable bowel gas pattern. Left lower lobe airspace disease. Electronically Signed   By: Marlaine Hind M.D.   On: 06/15/2021 09:00   CARDIAC CATHETERIZATION  Result Date: 06/18/2021 Successful placement of [place] catheter with initiation of VV ECMO.   DG CHEST PORT 1 VIEW  Result Date: 07/02/2021 CLINICAL DATA:  Tracheostomy.  Hypoxia.  Recent ECMO. EXAM: PORTABLE CHEST 1 VIEW COMPARISON:  07/01/2021. FINDINGS: Interim removal of ECMO device. Endotracheal tube, NG tube, left IJ line in stable position. Cardiomegaly with mild pulmonary venous congestion. Mild bibasilar atelectasis and infiltrates again noted unchanged from prior exam. No pleural effusion or pneumothorax. IMPRESSION: 1. Interim removal of ECMO device. Remaining lines and tubes in stable position. 2.  Cardiomegaly with mild pulmonary venous congestion. 3. Mild bibasilar atelectasis and infiltrates again noted unchanged from prior exam. Electronically Signed   By: Marcello Moores  Register M.D.   On: 07/02/2021 06:41   DG CHEST PORT 1 VIEW  Result Date: 07/01/2021 CLINICAL DATA:  Difficulty breathing EXAM: PORTABLE CHEST 1 VIEW COMPARISON:  Previous studies including the examination of 06/23/2021 FINDINGS: Transverse diameter of heart is within normal limits. Patchy infiltrates are seen in both lower lung fields, more so on the right side with interval worsening. There is prominence of interstitial markings in the parahilar regions. Tip of tracheostomy is proximally 9 cm above the carina. Enteric tube is noted traversing the esophagus. Tip of left jugular intravenous catheter is seen in the region of superior vena cava. There is a large caliber right jugular catheter, ECMO cannula with its distal course at the junction of inferior vena cava and right atrium. IMPRESSION: There are patchy infiltrates in  both lower lung fields, more so on the right side with interval worsening suggesting atelectasis/pneumonia. Other findings as described in the body of the report. Electronically Signed   By: Elmer Picker M.D.   On: 07/01/2021 08:29   DG CHEST PORT 1 VIEW  Result Date: 06/29/2021 CLINICAL DATA:  Respiratory failure. EXAM: PORTABLE CHEST 1 VIEW COMPARISON:  06/29/2021 FINDINGS: 0507 hours. Endotracheal tube tip is 7.7 cm above the base of the carina. A feeding tube passes into the stomach although the distal tip position is not included on the film. ECMO cannula again noted. Bibasilar atelectasis again noted with interval improvement in aeration at the left base. No substantial pleural effusion. Telemetry leads overlie the chest. IMPRESSION: 1. Interval improvement in aeration at the left base. 2. Otherwise no substantial change. Electronically Signed   By: Misty Stanley M.D.   On: 06/21/2021 08:30   DG CHEST PORT 1 VIEW  Result Date: 06/29/2021 CLINICAL DATA:  ECMO EXAM: PORTABLE CHEST 1 VIEW COMPARISON:  Chest radiograph 1 day prior FINDINGS: A large-bore ECMO cannula is again seen in stable position. A tracheostomy tube tip is in stable position in the midthoracic trachea. The enteric catheter tip courses off  the field of view. The heart is enlarged, unchanged. There is worsening left basilar retrocardiac opacity. The right lung is clear. There is probably a small left pleural effusion, increased in the interim. There is no significant right effusion. There is no pneumothorax. There is no acute osseous abnormality peer IMPRESSION: Worsening left basilar retrocardiac opacity and suspected new small left pleural effusion. Developing infection cannot be excluded. Electronically Signed   By: Valetta Mole M.D.   On: 06/29/2021 11:35   DG CHEST PORT 1 VIEW  Result Date: 06/28/2021 CLINICAL DATA:  History of ECMO, AFib, CHF EXAM: PORTABLE CHEST 1 VIEW COMPARISON:  Radiograph 07/01/2021 FINDINGS:  Feeding tube has been removed. Tracheostomy tube overlies the trachea. Central venous catheter tip overlies the distal superior vena cava. Unchanged ECMO cannula. Unchanged bibasilar airspace disease. No large pleural effusion. No visible pneumothorax. No acute osseous abnormality. IMPRESSION: Feeding tube has been removed. Otherwise stable ECMO cannula, tracheostomy tube and central venous catheter. Unchanged bibasilar airspace disease. Electronically Signed   By: Maurine Simmering M.D.   On: 06/28/2021 08:10   DG CHEST PORT 1 VIEW  Result Date: 06/24/2021 CLINICAL DATA:  ECMO soft EXAM: PORTABLE CHEST 1 VIEW COMPARISON:  Chest x-ray dated June 26, 2021 FINDINGS: ETT, left IJ line, and feeding tube are unchanged position. Unchanged position of ECMO cannula. Bibasilar opacities, unchanged compared to prior and likely due to atelectasis. No large pleural effusion or pneumothorax. IMPRESSION: Stable support devices. Bibasilar opacities are unchanged compared to prior likely due to atelectasis. Electronically Signed   By: Yetta Glassman M.D.   On: 07/04/2021 08:14   DG CHEST PORT 1 VIEW  Result Date: 06/26/2021 CLINICAL DATA:  56 year old male on ECMO. Influenza a. COPD with exacerbation. HIV. EXAM: PORTABLE CHEST 1 VIEW COMPARISON:  Portable chest 06/25/2021 and earlier. FINDINGS: Portable AP semi upright view at 0507 hours. Stable tracheostomy. Stable left IJ approach central line. Stable ECMO cannula. Stable enteric feeding tube coursing to the abdomen. Stable lung volumes. Stable mediastinal contours with mild cardiomegaly. Streaky bilateral lung base opacity. Ventilation has not significantly changed since November 9th. No pneumothorax or pulmonary edema. No pleural effusion is evident. Stable visualized osseous structures. IMPRESSION: 1. Stable lines and tubes. 2. Stable ventilation over this series of exams with streaky bibasilar opacity. Electronically Signed   By: Genevie Ann M.D.   On: 06/26/2021 07:44    DG CHEST PORT 1 VIEW  Result Date: 06/25/2021 CLINICAL DATA:  Tracheostomy tube and ECMO present EXAM: PORTABLE CHEST 1 VIEW COMPARISON:  Chest radiograph 1 day prior FINDINGS: The tracheostomy tube is in stable position. A left IJ central venous catheter is in stable position terminating in the mid SVC. The enteric catheter tip is off the field of view. A large-bore ECMO cannula courses off the field of view but the imaged portions are stable The cardiomediastinal silhouette is stable, with unchanged cardiomegaly. The costophrenic angles are not included within the field of view. There is no new focal consolidation in the imaged lungs. There is no large pleural effusion. There is no appreciable pneumothorax. The imaged bones are stable. IMPRESSION: 1. Unchanged support devices as above. 2. No significant interval change in lung aeration. Electronically Signed   By: Valetta Mole M.D.   On: 06/25/2021 08:22   DG CHEST PORT 1 VIEW  Result Date: 06/24/2021 CLINICAL DATA:  ECMO, tracheostomy EXAM: PORTABLE CHEST 1 VIEW COMPARISON:  06/23/2021 FINDINGS: No significant change in AP portable examination which excludes the most inferior lung  bases. Small, layering bilateral pleural effusions. Cardiomegaly. Unchanged support apparatus including venovenous ECMO cannula, tracheostomy, left neck vascular catheter, and partially imaged enteric feeding tube. IMPRESSION: 1. No significant change in AP portable examination which excludes the most inferior lung bases. Small, layering bilateral pleural effusions. No new airspace opacity. 2. Unchanged support apparatus. Electronically Signed   By: Delanna Ahmadi M.D.   On: 06/24/2021 08:28   DG CHEST PORT 1 VIEW  Result Date: 06/23/2021 CLINICAL DATA:  56 year old male on ECMO. EXAM: PORTABLE CHEST 1 VIEW COMPARISON:  Chest x-ray 06/22/2021. FINDINGS: A tracheostomy tube is in place with tip 7.7 cm above the carina. A feeding tube is seen extending into the abdomen,  however, the tip of the feeding tube extends below the lower margin of the image. There is a left-sided internal jugular central venous catheter with tip terminating in the distal superior vena cava. ECMO cannula projecting over the right internal jugular region extending into the upper abdomen. Lung volumes are normal. Bibasilar opacities which may reflect areas of atelectasis and/or consolidation, slightly worsened compared to the prior study. Small bilateral pleural effusions. No pneumothorax. No evidence of pulmonary edema. Heart size appears borderline enlarged. The patient is rotated to the left on today's exam, resulting in distortion of the mediastinal contours and reduced diagnostic sensitivity and specificity for mediastinal pathology. IMPRESSION: 1. Support apparatus, as above. 2. Worsening bibasilar opacities likely to reflect areas of increasing atelectasis and/or consolidation, along with superimposed small bilateral pleural effusions. Electronically Signed   By: Vinnie Langton M.D.   On: 06/23/2021 08:31   DG CHEST PORT 1 VIEW  Result Date: 06/22/2021 CLINICAL DATA:  ECMO EXAM: PORTABLE CHEST 1 VIEW COMPARISON:  Yesterday FINDINGS: Stable cardiomegaly and hazy opacity at the bases which is likely both airspace opacity and pleural fluid. ECMO cannula and left IJ line in stable position. Stable tracheostomy tube and feeding tube positioning. No pneumothorax. Stable hardware and lower chest opacification. IMPRESSION: Stable hardware and lower chest opacification. Electronically Signed   By: Jorje Guild M.D.   On: 06/22/2021 08:49   DG CHEST PORT 1 VIEW  Result Date: 06/21/2021 CLINICAL DATA:  Tracheostomy EXAM: PORTABLE CHEST 1 VIEW COMPARISON:  Prior chest x-ray 06/20/2021 FINDINGS: Interval placement of tracheostomy tube. Tube tip is midline and at the level of the clavicles in good position. Feeding tube remains present. The tip lies off the field of view, presumably in the small  bowel. Left IJ approach central venous catheter. Tip overlies the mid SVC. ECMO catheter via right internal jugular vein. Stable cardiomegaly. Probable left layering pleural effusion. Bibasilar atelectasis. No pneumothorax. No pulmonary edema. IMPRESSION: Interval removal of endotracheal tube and placement of a tracheostomy tube. Otherwise, stable chest x-ray. Electronically Signed   By: Jacqulynn Cadet M.D.   On: 06/21/2021 07:22   DG CHEST PORT 1 VIEW  Result Date: 06/20/2021 CLINICAL DATA:  Tube placement EXAM: PORTABLE CHEST 1 VIEW COMPARISON:  Chest x-ray 06/19/2021 FINDINGS: Endotracheal tube tip is approximately 1.7 cm above the carina. Enteric tube tip is below the diaphragm. Left internal jugular line tip in the SVC. ECMO cannula in the right. Cardiomediastinal silhouette is unchanged. Persistent granular and hazy opacities in the lower lung zones with likely small layering pleural effusions. No pneumothorax. IMPRESSION: 1. Medical devices as described. 2. No significant change since previous study. Electronically Signed   By: Ofilia Neas M.D.   On: 06/20/2021 08:14   DG CHEST PORT 1 VIEW  Result Date: 06/19/2021 CLINICAL  DATA:  Endotracheal tube adjustment.  On ECMO. EXAM: PORTABLE CHEST 1 VIEW COMPARISON:  Chest x-ray from same day at 0512 hours. FINDINGS: Endotracheal tube tip approximately 2.8 cm above the carina. Unchanged feeding tube, ECMO cannula, and left internal jugular central venous catheter. Stable cardiomediastinal silhouette and hazy opacity at both lung bases, likely a combination of layering pleural effusions and atelectasis. No pneumothorax. No acute osseous abnormality. IMPRESSION: 1. Endotracheal tube tip 2.8 cm above the carina. Otherwise stable lines and tubes. 2. Stable layering bilateral pleural effusions and bibasilar atelectasis. Electronically Signed   By: Titus Dubin M.D.   On: 06/19/2021 09:56   DG CHEST PORT 1 VIEW  Result Date: 06/19/2021 CLINICAL  DATA:  Tube placement. EXAM: PORTABLE CHEST 1 VIEW COMPARISON:  Chest x-ray 06/18/2021 FINDINGS: Endotracheal tube tip is 3.3 cm above the carina. ECMO cannula unchanged. Left internal jugular line tip in SVC. Enteric tube tip below the diaphragm. Heart size within normal limits. Mediastinum is stable. Hazy opacities in the lower lung zones similar to previous study. No new consolidation visualized. No pneumothorax. IMPRESSION: No significant change since previous study. Medical devices as described. Electronically Signed   By: Ofilia Neas M.D.   On: 06/19/2021 08:49   DG CHEST PORT 1 VIEW  Result Date: 06/18/2021 CLINICAL DATA:  56 year old male on ECMO. EXAM: PORTABLE CHEST 1 VIEW COMPARISON:  Chest x-ray 06/22/2021. FINDINGS: An endotracheal tube is in place with tip 5.1 cm above the carina. ECMO cannula projecting over the mid right hemithorax. There is a left-sided internal jugular central venous catheter with tip terminating in the distal superior vena cava. A feeding tube is seen extending into the abdomen, however, the tip of the feeding tube extends below the lower margin of the image. Lung volumes appear normal. Ill-defined bibasilar airspace disease (left greater than right). Small left pleural effusion. No definite right pleural effusion. No pneumothorax. No evidence of pulmonary edema. Heart size is normal. Upper mediastinal contours are within normal limits. IMPRESSION: 1. Support apparatus, as above. 2. Bibasilar airspace disease, concerning for pneumonia or sequela of aspiration. 3. Small left pleural effusion. Electronically Signed   By: Vinnie Langton M.D.   On: 06/18/2021 09:03   DG Chest Port 1 View  Result Date: 07/04/2021 CLINICAL DATA:  History of CHF and COPD.  ECMO. EXAM: PORTABLE CHEST 1 VIEW COMPARISON:  Chest radiograph dated 06/26/2021. FINDINGS: Interval removal of the previously seen right IJ central venous line and placement of a cannula extending down into the IVC.  Endotracheal tube remains above the carina. Feeding tube extends below the diaphragm with tip beyond the inferior margin of the image. Left IJ central venous line with tip over central SVC. Bilateral mid to lower lung field streaky and confluent densities similar to prior radiograph. Probable small bilateral pleural effusions. No pneumothorax. Stable cardiomegaly. No acute osseous pathology. IMPRESSION: 1. Interval removal of the previously seen right IJ central venous line and placement of a cannula extending into the IVC. 2. Stable cardiomegaly. Stable bilateral mid to lower lung field streaky and confluent densities. Electronically Signed   By: Anner Crete M.D.   On: 06/24/2021 19:25   DG CHEST PORT 1 VIEW  Result Date: 06/22/2021 CLINICAL DATA:  Central line placement EXAM: PORTABLE CHEST 1 VIEW COMPARISON:  06/25/2021 at 1142 hours FINDINGS: Interval placement of right IJ central venous catheter with distal tip terminating in the region of the proximal SVC. Stable positioning of ET tube. Enteric tube courses below the diaphragm  with distal tip beyond the inferior margin of the film. Stable heart size. Persistent bibasilar opacities, right worse than left. No pneumothorax. IMPRESSION: Interval placement of right IJ central venous catheter. No pneumothorax. Electronically Signed   By: Davina Poke D.O.   On: 06/11/2021 15:24   DG CHEST PORT 1 VIEW  Result Date: 07/07/2021 CLINICAL DATA:  Evaluation of endotracheal and orogastric tube EXAM: PORTABLE CHEST 1 VIEW COMPARISON:  Radiographs dated June 16, 2021 FINDINGS: The heart size and mediastinal contours are within normal limits. Bibasilar atelectasis, right greater than the left, unchanged. Endotracheal tube with distal tip approximately 4 cm above the carina. Feeding tube coursing below the diaphragm the distal tip not included. IMPRESSION: Endotracheal tube with distal tip approximately 4 cm above the carina. Feeding tube coursing below the  diaphragm with distal tip not included. No significant interval change and bibasilar lung opacities, right greater than left concerning for atelectasis or infiltrate. Electronically Signed   By: Keane Police D.O.   On: 06/11/2021 11:58   DG CHEST PORT 1 VIEW  Result Date: 06/16/2021 CLINICAL DATA:  Hypoxemia EXAM: PORTABLE CHEST 1 VIEW COMPARISON:  06/16/2021 FINDINGS: The tip of the endotracheal 2 is approximately 4.8 cm below level of the clavicular heads and approximately 4 cm above the inferior margin the carina. There is improved aeration at the right lung base with some residual opacity. IMPRESSION: Improved aeration of the right lung base with some residual opacity. Electronically Signed   By: Ulyses Jarred M.D.   On: 06/16/2021 23:58   DG CHEST PORT 1 VIEW  Result Date: 06/16/2021 CLINICAL DATA:  Endotracheal tube, aspiration. EXAM: PORTABLE CHEST 1 VIEW COMPARISON:  June 15, 2021 FINDINGS: Endotracheal tube with tip projecting over the midthoracic trachea. Enteric tube courses below the diaphragm with tip obscured by collimation. Again seen is the possible coiled nasogastric tube in the pharynx/larynx. The heart size and mediastinal contours are unchanged. Increased streaky bibasilar airspace opacities. The visualized skeletal structures are unchanged. IMPRESSION: Increased streaky bibasilar airspace opacities, likely atelectasis and/or aspiration. Endotracheal tube with tip in the midthoracic trachea. Again seen is the possible coiled gastric decompression tube in the pharynx/larynx. Electronically Signed   By: Dahlia Bailiff M.D.   On: 06/16/2021 21:29   Portable Chest x-ray  Result Date: 06/15/2021 CLINICAL DATA:  Respiratory failure and re-intubation. EXAM: PORTABLE CHEST 1 VIEW COMPARISON:  07/02/2021 FINDINGS: Stable cardiac enlargement. Endotracheal tube present with the tip approximately 4 cm above the carina. There may be an orogastric or nasogastric tube coiled in the pharynx/larynx  at the top of the chest x-ray. Lungs demonstrate persistent bibasilar opacities, slightly greater on the right. Chronic lung disease appears stable. No pneumothorax or significant pleural fluid. IMPRESSION: Endotracheal tube tip approximately 4 cm above the carina. Possible coiled gastric decompression tube in the pharynx/larynx. Bibasilar opacities remain present, slightly more prominent on the right. Electronically Signed   By: Aletta Edouard M.D.   On: 06/15/2021 08:53   DG CHEST PORT 1 VIEW  Result Date: 07/10/2021 CLINICAL DATA:  SOB EXAM: PORTABLE CHEST - 1 VIEW COMPARISON:  06/18/2021 FINDINGS: Interval intubation, endotracheal tube tip 7.3 cm above carina. Gastric tube loops in the stomach. There are mild patchy airspace opacities in the lung bases, new since previous. Heart size and mediastinal contours are within normal limits. No effusion.  No pneumothorax. Degenerative changes in the left shoulder. IMPRESSION: 1. Endotracheal tube and nasogastric tube in good position. 2. Mild patchy airspace opacities in both lung  bases, new since previous. Electronically Signed   By: Lucrezia Europe M.D.   On: 06/29/2021 17:11   DG Chest Portable 1 View  Result Date: 06/20/2021 CLINICAL DATA:  56 year old male with atrial fibrillation. EXAM: PORTABLE CHEST 1 VIEW COMPARISON:  Chest radiographs 05/17/2021 and earlier. FINDINGS: Portable AP semi upright view at 0419 hours. Mild to moderate cardiomegaly appears stable allowing for differences in technique. Other mediastinal contours are within normal limits. Visualized tracheal air column is within normal limits. Asymmetric increased markings at the right lung base are stable. Elsewhere when allowing for portable technique the lungs are clear. No pneumothorax or pleural effusion. No acute osseous abnormality identified. IMPRESSION: No acute cardiopulmonary abnormality. Electronically Signed   By: Genevie Ann M.D.   On: 06/25/2021 04:55   DG Abd Portable 1V  Result  Date: 06/25/2021 CLINICAL DATA:  Feeding tube placement EXAM: PORTABLE ABDOMEN - 1 VIEW COMPARISON:  06/15/2021 FINDINGS: Feeding tube passes through the stomach with the tip near the gastric antrum. Normal bowel gas pattern. IMPRESSION: Feeding tube tip in the region the gastric antrum. Electronically Signed   By: Franchot Gallo M.D.   On: 07/09/2021 12:24   DG Abd Portable 1V  Result Date: 07/10/2021 CLINICAL DATA:  Evaluate NG tube placement EXAM: PORTABLE ABDOMEN - 1 VIEW COMPARISON:  June 28, 2021 FINDINGS: The NG tube terminates in the stomach. IMPRESSION: The NG tube terminates in the stomach. Electronically Signed   By: Dorise Bullion III M.D.   On: 06/18/2021 15:17   DG Abd Portable 1V  Result Date: 06/28/2021 CLINICAL DATA:  Enteric tube placement EXAM: PORTABLE ABDOMEN - 1 VIEW COMPARISON:  06/26/2021 FINDINGS: Tip of enteric tube is seen in the distal antrum of the stomach. There is moderate gaseous distention of stomach. There is a large caliber vascular sheath projecting in the course superior vena cava, right atrium and cephalad course of inferior vena cava with its tip below the level of right hemidiaphragm. IMPRESSION: Tip of enteric tube is seen in the distal antrum of the stomach. Electronically Signed   By: Elmer Picker M.D.   On: 06/28/2021 13:34   DG Abd Portable 1V  Result Date: 06/19/2021 CLINICAL DATA:  Feeding tube placement. EXAM: PORTABLE ABDOMEN - 1 VIEW COMPARISON:  Abdominal x-ray dated June 17, 2021. FINDINGS: Feeding tube tip now in the proximal small bowel near the ligament of Treitz. The bowel gas pattern is normal. No radio-opaque calculi or other significant radiographic abnormality are seen. New ECMO cannula noted with tip in the IVC. IMPRESSION: 1. Feeding tube tip in the proximal small bowel near the ligament of Treitz. Electronically Signed   By: Titus Dubin M.D.   On: 06/19/2021 09:54   DG Abd Portable 1V  Result Date:  06/22/2021 CLINICAL DATA:  Ileus. EXAM: PORTABLE ABDOMEN - 1 VIEW COMPARISON:  Yesterday. FINDINGS: Orogastric tube tip in the mid stomach. Less injected contrast in the gastric fundus with a small amount seen more distally in the antrum. No bowel contrast or dilatation seen. Interval mild patchy opacity at the right lung base and continued denser opacity at the left lung base. Right hip fixation hardware. IMPRESSION: 1. No radiographic evidence of ileus or obstruction. 2. Bibasilar atelectasis and possible pneumonia, left greater than right. Electronically Signed   By: Claudie Revering M.D.   On: 06/23/2021 09:27   DG Abd Portable 1V  Result Date: 06/16/2021 CLINICAL DATA:  Orogastric tube placement EXAM: PORTABLE ABDOMEN - 1 VIEW COMPARISON:  None.  FINDINGS: Tip and side port of the orogastric tube are in the stomach. IMPRESSION: Orogastric tube tip in the stomach. Electronically Signed   By: Ulyses Jarred M.D.   On: 06/16/2021 23:55   ECHOCARDIOGRAM COMPLETE  Result Date: 06/14/2021    ECHOCARDIOGRAM REPORT   Patient Name:   Trampus Mcquerry Rowley Brooke [name]. Date of Exam: 06/14/2021 Medical Rec #:  [id number]         Height:       69.0 in Accession #:    [id number]        Weight:       238.1 lb Date of Birth:  05-Feb-1966          BSA:          2.225 m Patient Age:    53 years          BP:           98/75 mmHg Patient Gender: M                 HR:           87 bpm. Exam Location:  Inpatient Procedure: 2D Echo, Cardiac Doppler, Color Doppler and Intracardiac            Opacification Agent Indications:    Atrial fibrillation  History:        Patient has prior history of Echocardiogram examinations. CHF,                 COPD, Arrythmias:Atrial Fibrillation, Signs/Symptoms:Fever,                 Shortness of Breath and Influezna A, HIV; Risk                 Factors:Hypertension, Diabetes, Current Smoker and morbid                 obesity.  Sonographer:    Dustin Flock RDCS Referring Phys: Donovan  1.  Left ventricular ejection fraction, by estimation, is 55 to 60%. The left ventricle has normal function. The left ventricle has no regional wall motion abnormalities. Left ventricular diastolic function could not be evaluated.  2. Right ventricular systolic function is normal. The right ventricular size is normal. Tricuspid regurgitation signal is inadequate for assessing PA pressure.  3. The mitral valve is grossly normal. No evidence of mitral valve regurgitation. No evidence of mitral stenosis.  4. The aortic valve is tricuspid. Aortic valve regurgitation is not visualized. No aortic stenosis is present.  5. The inferior vena cava is dilated in size with >50% respiratory variability, suggesting right atrial pressure of 8 mmHg. Comparison(s): A prior study was performed on 05/01/2020. No significant change from prior study. FINDINGS  Left Ventricle: Left ventricular ejection fraction, by estimation, is 55 to 60%. The left ventricle has normal function. The left ventricle has no regional wall motion abnormalities. Definity contrast agent was given IV to delineate the left ventricular  endocardial borders. The left ventricular internal cavity size was normal in size. There is no left ventricular hypertrophy. Left ventricular diastolic function could not be evaluated due to atrial fibrillation. Left ventricular diastolic function could  not be evaluated. Right Ventricle: The right ventricular size is normal. No increase in right ventricular wall thickness. Right ventricular systolic function is normal. Tricuspid regurgitation signal is inadequate for assessing PA pressure. Left Atrium: Left atrial size was normal in size. Right Atrium: Right atrial size was normal in size. Pericardium: There  is no evidence of pericardial effusion. Mitral Valve: The mitral valve is grossly normal. No evidence of mitral valve regurgitation. No evidence of mitral valve stenosis. Tricuspid Valve: The tricuspid valve is grossly normal.  Tricuspid valve regurgitation is not demonstrated. No evidence of tricuspid stenosis. Aortic Valve: The aortic valve is tricuspid. Aortic valve regurgitation is not visualized. No aortic stenosis is present. Pulmonic Valve: The pulmonic valve was grossly normal. Pulmonic valve regurgitation is not visualized. No evidence of pulmonic stenosis. Aorta: The aortic root and ascending aorta are structurally normal, with no evidence of dilitation. Venous: The right lower pulmonary vein is normal. The inferior vena cava is dilated in size with greater than 50% respiratory variability, suggesting right atrial pressure of 8 mmHg. IAS/Shunts: The atrial septum is grossly normal.  LEFT VENTRICLE PLAX 2D LVIDd:         4.48 cm   Diastology LVIDs:         3.80 cm   LV e' medial:    7.62 cm/s LV PW:         1.15 cm   LV E/e' medial:  7.7 LV IVS:        1.06 cm   LV e' lateral:   7.62 cm/s LVOT diam:     2.10 cm   LV E/e' lateral: 7.7 LV SV:         45 LV SV Index:   20 LVOT Area:     3.46 cm  RIGHT VENTRICLE RV Basal diam:  3.53 cm RV S prime:     7.94 cm/s TAPSE (M-mode): 1.6 cm LEFT ATRIUM             Index LA diam:        3.40 cm 1.53 cm/m LA Vol (A2C):   67.1 ml 30.16 ml/m LA Vol (A4C):   54.6 ml 24.54 ml/m LA Biplane Vol: 63.9 ml 28.72 ml/m  AORTIC VALVE LVOT Vmax:   73.80 cm/s LVOT Vmean:  53.900 cm/s LVOT VTI:    0.130 m  AORTA Ao Root diam: 3.40 cm MITRAL VALVE MV Area (PHT): 4.26 cm    SHUNTS MV Decel Time: 178 msec    Systemic VTI:  0.13 m MV E velocity: 58.70 cm/s  Systemic Diam: 2.10 cm MV A velocity: 33.00 cm/s MV E/A ratio:  1.78 Eleonore Chiquito MD Electronically signed by Eleonore Chiquito MD Signature Date/Time: 06/14/2021/4:28:45 PM    Final    VAS [place] LOWER EXTREMITY VENOUS (DVT)  Result Date: 07/09/2021  Lower Venous DVT Study Patient Name:  Moustapha Tooker Carattini Brooke [name].  Date of Exam:   06/19/2021 Medical Rec #: [id number]          Accession #:    [id number] Date of Birth: 09-28-1965           Patient Gender: M Patient Age:    61 years Exam Location:  [facility] Procedure:      VAS [place] LOWER EXTREMITY VENOUS (DVT) Referring Phys: JESSICA MARSHALL --------------------------------------------------------------------------------  Indications: Edema.  Limitations: Body habitus and poor ultrasound/tissue interface. Comparison Study: No prior study Performing Technologist: Maudry Mayhew MHA, RDMS, RVT, RDCS  Examination Guidelines: A complete evaluation includes B-mode imaging, spectral Doppler, color Doppler, and power Doppler as needed of all accessible portions of each vessel. Bilateral testing is considered an integral part of a complete examination. Limited examinations for reoccurring indications may be performed as noted. The reflux portion of the exam is performed with the patient in reverse Trendelenburg.  +---------+---------------+---------+-----------+----------+--------------+  RIGHT    CompressibilityPhasicitySpontaneityPropertiesThrombus Aging +---------+---------------+---------+-----------+----------+--------------+ CFV      Full           Yes      Yes                                 +---------+---------------+---------+-----------+----------+--------------+ SFJ      Full                                                        +---------+---------------+---------+-----------+----------+--------------+ FV Prox  Full                                                        +---------+---------------+---------+-----------+----------+--------------+ FV Mid   Full                                                        +---------+---------------+---------+-----------+----------+--------------+ FV DistalFull                                                        +---------+---------------+---------+-----------+----------+--------------+ PFV      Full                                                         +---------+---------------+---------+-----------+----------+--------------+ POP      Full           Yes      Yes                                 +---------+---------------+---------+-----------+----------+--------------+ PTV      Full                                                        +---------+---------------+---------+-----------+----------+--------------+ PERO     Full                                                        +---------+---------------+---------+-----------+----------+--------------+   Right Technical Findings: Not visualized segments include Limited visualization PTV and peroneal veins.  +---------+---------------+---------+-----------+----------+--------------+ LEFT     CompressibilityPhasicitySpontaneityPropertiesThrombus Aging +---------+---------------+---------+-----------+----------+--------------+ CFV      Full           Yes  Yes                                 +---------+---------------+---------+-----------+----------+--------------+ SFJ      Full                                                        +---------+---------------+---------+-----------+----------+--------------+ FV Prox  Full                                                        +---------+---------------+---------+-----------+----------+--------------+ FV Mid   Full                                                        +---------+---------------+---------+-----------+----------+--------------+ FV DistalFull                                                        +---------+---------------+---------+-----------+----------+--------------+ PFV      Full                                                        +---------+---------------+---------+-----------+----------+--------------+ POP      Full           Yes      Yes                                 +---------+---------------+---------+-----------+----------+--------------+ PTV      Full                                                         +---------+---------------+---------+-----------+----------+--------------+ PERO     Full                                                        +---------+---------------+---------+-----------+----------+--------------+   Left Technical Findings: Not visualized segments include Limited visualization PTV and peroneal veins.   Summary: RIGHT: - There is no evidence of deep vein thrombosis in the lower extremity. However, portions of this examination were limited- see technologist comments above.  - No cystic structure found in the popliteal fossa.  LEFT: - There is no evidence of deep vein thrombosis in the lower extremity. However, portions of this examination were limited- see technologist comments  above.  - No cystic structure found in the popliteal fossa.  *See table(s) above for measurements and observations. Electronically signed by Servando Snare MD on 06/11/2021 at 2:03:46 PM.    Final    CT Angio Abd/Pel w/ and/or w/o  Result Date: 07/02/2021 CLINICAL DATA:  GI bleed. EXAM: CTA ABDOMEN AND PELVIS WITHOUT AND WITH CONTRAST TECHNIQUE: Multidetector CT imaging of the abdomen and pelvis was performed using the standard protocol during bolus administration of intravenous contrast. Multiplanar reconstructed images and MIPs were obtained and reviewed to evaluate the vascular anatomy. CONTRAST:  48m OMNIPAQUE IOHEXOL 350 MG/ML SOLN COMPARISON:  Chest XR, 07/02/2021. KUB, 06/28/2021. Renal ultrasound report, 10/08/2017. CTA PE, 07/23/2017. FINDINGS: The exam is moderately degraded secondary to streak artifact and relatively poor contrast opacification. VASCULAR Aorta: Mild-to-moderate calcified and noncalcified aortic atherosclerosis. No aneurysm, dissection, vasculitis or significant stenosis. Celiac: Patent without evidence of aneurysm, dissection, vasculitis or significant stenosis. SMA: Variant anatomy, with replaced RIGHT hepatic artery. Focal  intimal thickening of the proximal SMA, in a 3 cm segment (see key image). No significant (< 50%) stenosis. Renals: Single RIGHT renal artery. Two LEFT renal arteries, with small inferior pole accessory. The renal arteries are patent without evidence of aneurysm, dissection, vasculitis, fibromuscular dysplasia or significant stenosis. IMA: Patent without evidence of aneurysm, dissection, vasculitis or significant stenosis. Inflow: Patent without evidence of aneurysm, dissection, vasculitis or significant stenosis. Proximal Outflow: Bilateral common femoral and visualized portions of the superficial and profunda femoral arteries are patent without evidence of aneurysm, dissection, vasculitis or significant stenosis. Veins: No obvious venous abnormality within the limitations of this arterial phase study. Review of the MIP images confirms the above findings. NON-VASCULAR Lower chest: Bibasilar dependent consolidations. Hepatobiliary: No focal liver abnormality is seen. Mild gallbladder distention. No gallstones, gallbladder wall thickening, or biliary dilatation. Pancreas: Unremarkable. No pancreatic ductal dilatation or surrounding inflammatory changes. Spleen: Normal in size without focal abnormality. Adrenals/Urinary Tract: Adrenal glands are unremarkable. Kidneys are normal, without renal calculi, focal lesion, or hydronephrosis. Bladder is decompressed by Foley catheter. Non dependent intraluminal air, likely from catheterization. Urachal remnant. Stomach/Bowel: Enteric feeding tube with transpyloric tip, within the proximal duodenum. Stomach is within normal limits. Appendix appears normal. Nonobstructed small bowel. Nondilated colon. Fecal management system within rectum. No evidence of bowel wall thickening, distention, or inflammatory changes. Lymphatic: No enlarged abdominal or pelvic lymph nodes. Reproductive: Prostate is unremarkable. Other: Small fat-containing paraumbilical hernia. No abdominopelvic  ascites. Musculoskeletal: No acute osseous findings. RIGHT proximal femoral PMN fixation. IMPRESSION: Suboptimal evaluation, within these constraints; VASCULAR 1. No CTA evidence of acute active extravasation to explain the patient's reported gastrointestinal bleeding. 2. Focal intraluminal thickening of the proximal SMA, as above, without CTA evidence of hemodynamically significant stenosis. No evidence of vascular compromise to bowel. NON-VASCULAR 1. Bibasilar dependent consolidations are suspicious for pneumonia. 2. NG tube with transpyloric tip within proximal duodenum. If gastric placement was desired, recommend retraction for appropriate placement. 3. Additional incidental and senescent findings, as above. JMichaelle Birks MD [facility] Electronically Signed   By: JMichaelle BirksM.D.   On: 07/02/2021 09:23    Microbiology No results found for this or any previous visit (from the past 240 hour(s)).  Lab Basic Metabolic Panel: Recent Labs  Lab 07/04/21 1700 07/04/21 1835 07/04/21 1949 [id number]  NA 146* 147*  --  144 147*  K 5.6* 5.8*  --  5.2* 5.2*  CL 109  --   --  109  --   CO2 29  --   --  25  --   GLUCOSE 184*  --  257* 183*  --   BUN 163*  --   --  171*  --   CREATININE 4.36*  --   --  4.73*  --   CALCIUM 7.7*  --   --  7.8*  --    Liver Function Tests: Recent Labs  Lab 13-Jul-2021 0045  AST 67*  ALT 66*  ALKPHOS 41  BILITOT 0.8  PROT 6.0*  ALBUMIN <1.5*   No results for input(s): LIPASE, AMYLASE in the last 168 hours. No results for input(s): AMMONIA in the last 168 hours. CBC: Recent Labs  Lab 07/04/21 1700 07/04/21 1835 07/13/2021 0045 07-13-2021 0639  WBC 10.4  --  11.4*  --   HGB 7.0* 7.5* 6.8* 6.5*  HCT 24.2* 22.0* 22.9* 19.0*  MCV 100.0  --  99.6  --   PLT 115*  --  117*  --    Cardiac Enzymes: No results for input(s): CKTOTAL, CKMB, CKMBINDEX, TROPONINI in the last 168 hours. Sepsis  Labs: Recent Labs  Lab 07/04/21 1700 07-13-21 0045  WBC 10.4 11.4*    Procedures/Operations  CVL Vvecmo cannula  ETT    Elsia Lasota L Shellby Schlink 07/11/2021, 11:05 AM

## 2021-08-12 IMAGING — DX DG CHEST 1V PORT
1 series · 1 of 1 positions shown · non-contrast
Comparison: April 30, 2020.

CLINICAL DATA: Shortness of breath.

EXAM:
PORTABLE CHEST 1 VIEW

[chest ap]
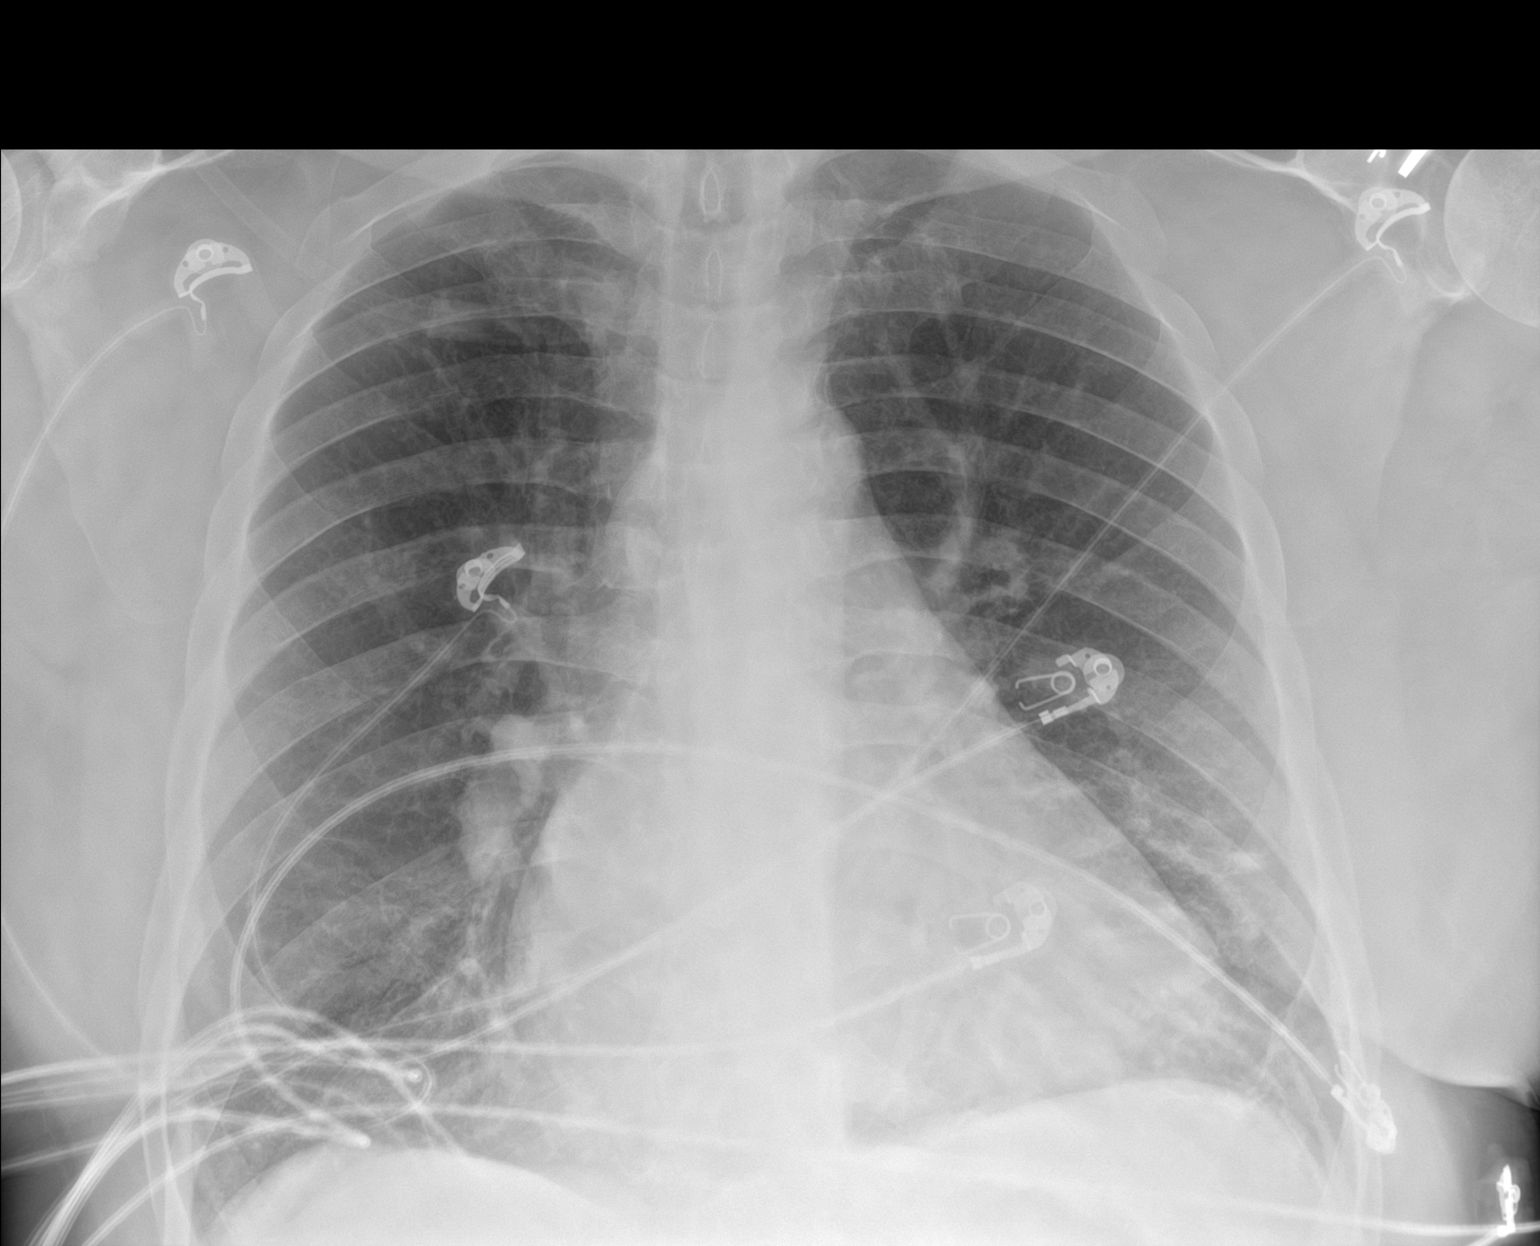

[1 of 1 positions shown; findings below may reference images not displayed]

FINDINGS: Stable cardiomegaly. No pneumothorax or pleural effusion is noted.
Both lungs are clear. The visualized skeletal structures are
unremarkable.
IMPRESSION: No active disease.

## 2021-09-01 ENCOUNTER — Other Ambulatory Visit: Payer: Self-pay | Admitting: Adult Health

## 2021-09-01 DIAGNOSIS — Z8679 Personal history of other diseases of the circulatory system: Secondary | ICD-10-CM

## 2021-09-23 ENCOUNTER — Other Ambulatory Visit: Payer: Self-pay | Admitting: Nurse Practitioner

## 2021-09-23 DIAGNOSIS — E1165 Type 2 diabetes mellitus with hyperglycemia: Secondary | ICD-10-CM

## 2021-09-26 ENCOUNTER — Other Ambulatory Visit: Payer: Self-pay | Admitting: Nurse Practitioner

## 2021-09-26 DIAGNOSIS — E1165 Type 2 diabetes mellitus with hyperglycemia: Secondary | ICD-10-CM

## 2021-10-08 ENCOUNTER — Ambulatory Visit: Payer: Medicare HMO | Admitting: Family

## 2021-10-10 ENCOUNTER — Other Ambulatory Visit: Payer: Self-pay | Admitting: Nurse Practitioner

## 2021-10-10 DIAGNOSIS — E1140 Type 2 diabetes mellitus with diabetic neuropathy, unspecified: Secondary | ICD-10-CM

## 2021-10-10 DIAGNOSIS — N521 Erectile dysfunction due to diseases classified elsewhere: Secondary | ICD-10-CM

## 2022-01-30 IMAGING — DX DG CHEST 2V
2 series · 2 of 2 positions shown · non-contrast
Comparison: 10/29/2020

CLINICAL DATA: 55-year-old male with COPD

EXAM:
CHEST - 2 VIEW

[chest pa]
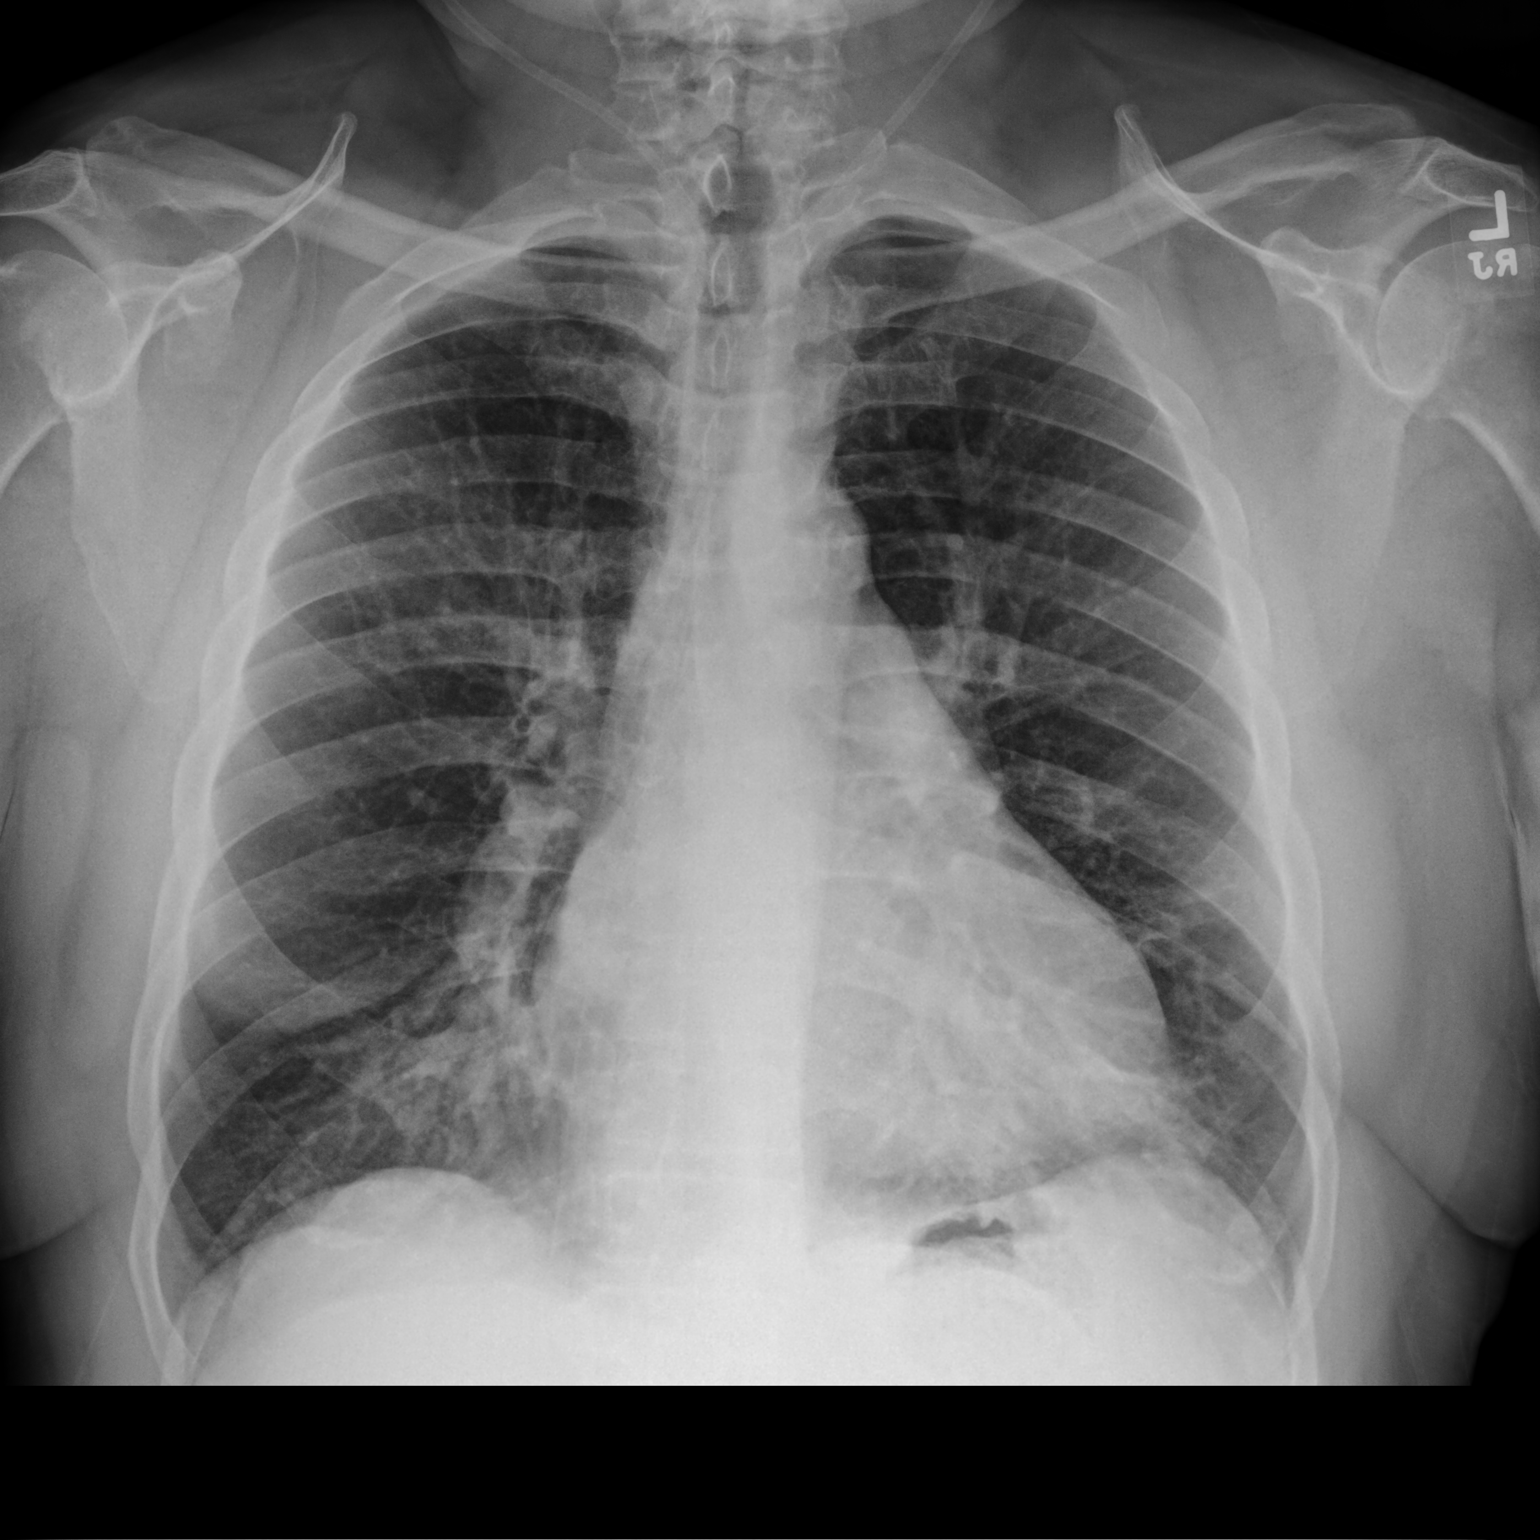

[chest lat]
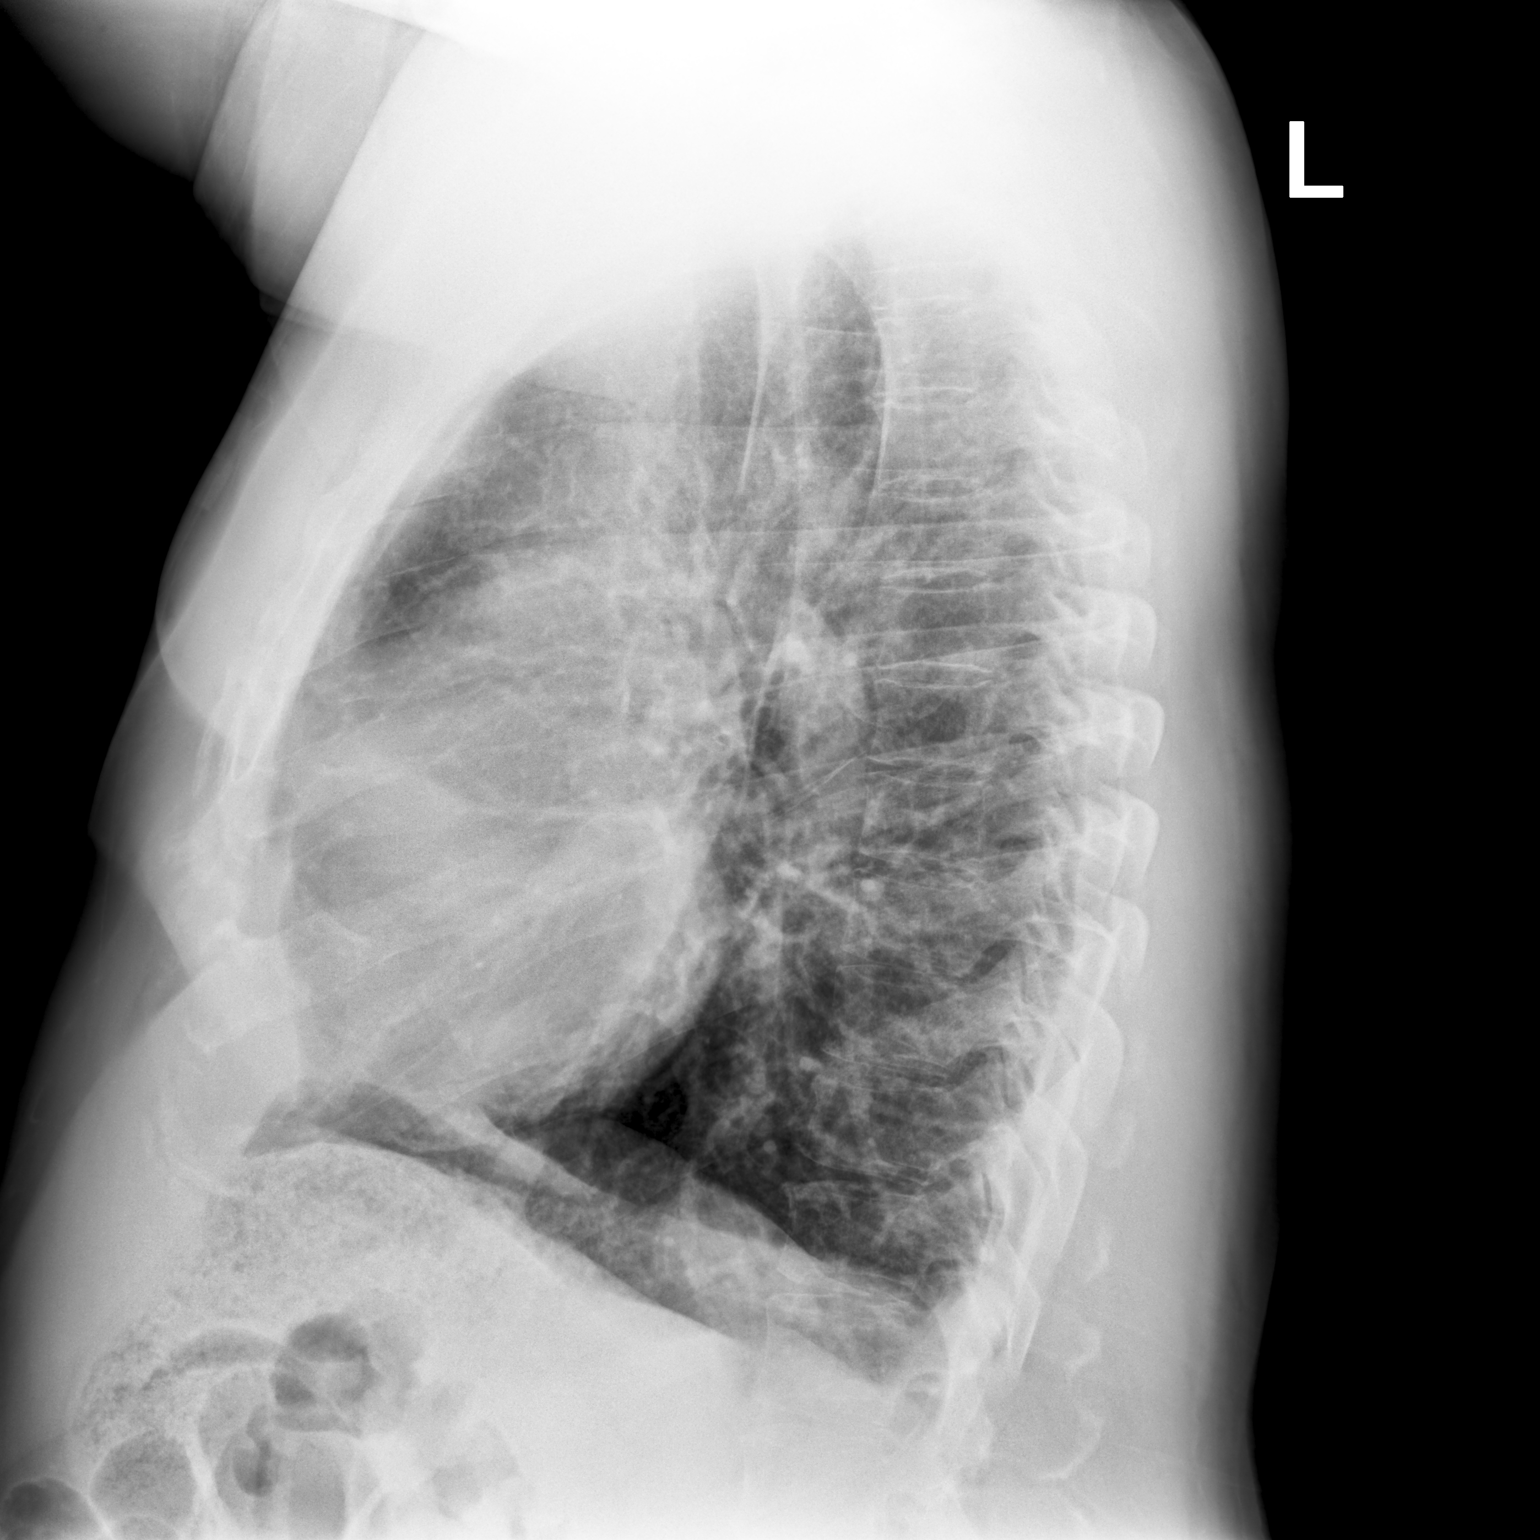

[2 of 2 positions shown; findings below may reference images not displayed]

FINDINGS: Cardiomediastinal silhouette unchanged in size and contour. No
evidence of central vascular congestion. No interlobular septal
thickening.

Stigmata of emphysema, with increased retrosternal airspace,
flattened hemidiaphragms, increased AP diameter, and hyperinflation
on the AP view.

Ill-defined opacity in the medial right lung base, increased from
the comparison corresponding to ill-defined opacities on the lateral
view overlying the cardiac silhouette.

No acute displaced fracture. Degenerative changes of the spine.
IMPRESSION: Ill-defined opacities at the right medial lung base, potentially
developing infection.

Background of emphysema

## 2022-02-28 IMAGING — DX DG CHEST 2V
2 series · 2 of 2 positions shown · non-contrast
Comparison: 04/18/2021

CLINICAL DATA: COPD exacerbation.

EXAM:
CHEST - 2 VIEW

[chest pa]
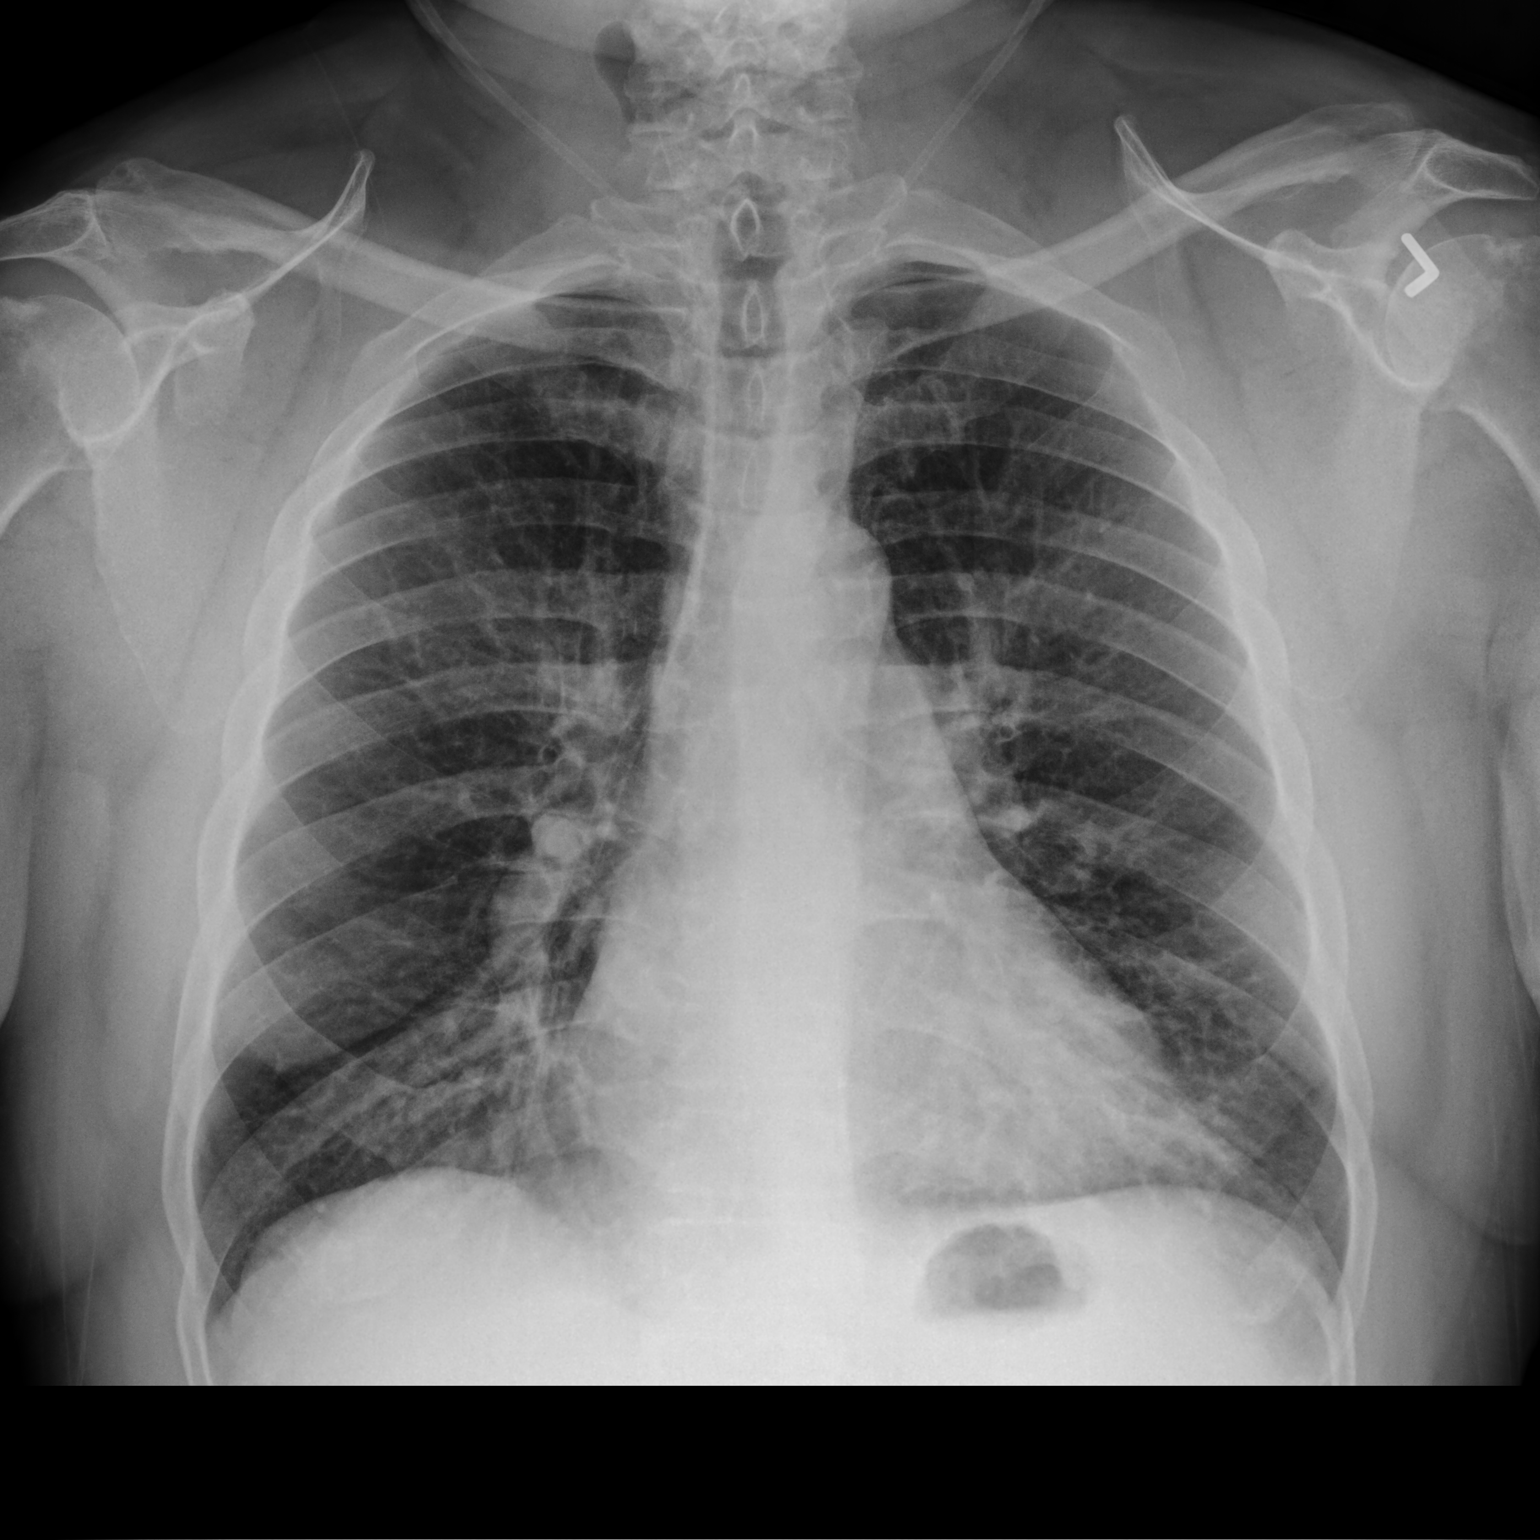

[chest lat]
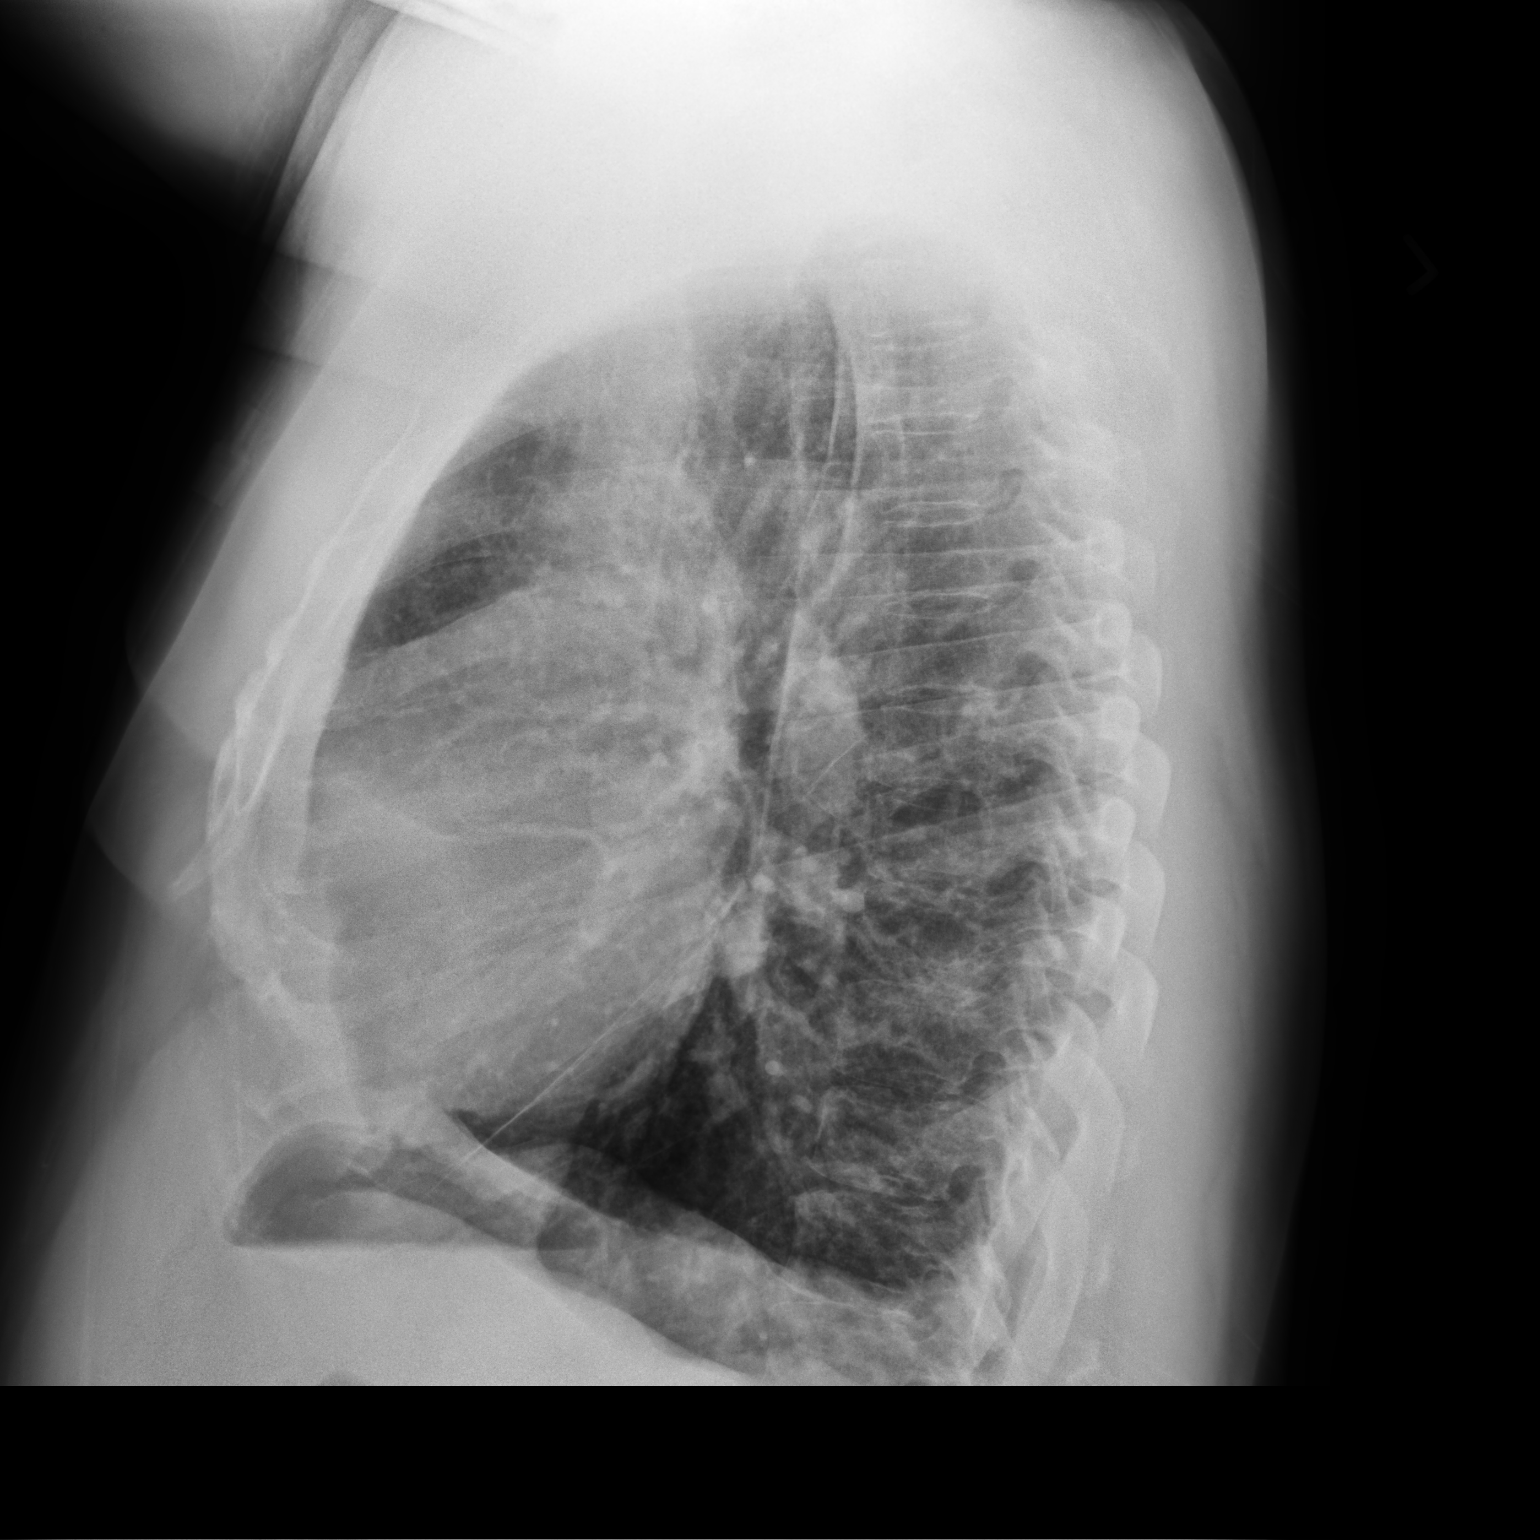

[2 of 2 positions shown; findings below may reference images not displayed]

FINDINGS: Heart size and pulmonary vascularity are normal. Emphysematous
changes in the lungs. Peribronchial thickening and central
interstitial changes suggesting chronic bronchitis. No airspace
disease or consolidation. No pleural effusions. No pneumothorax.
Mediastinal contours appear intact.
IMPRESSION: Emphysematous and chronic bronchitic changes in the lungs. No focal
consolidation.

## 2022-03-27 IMAGING — DX DG CHEST 1V PORT
1 series · 1 of 1 positions shown · non-contrast
Comparison: Chest radiographs 05/17/2021 and earlier.

CLINICAL DATA: 55-year-old male with atrial fibrillation.

EXAM:
PORTABLE CHEST 1 VIEW

[chest ap]
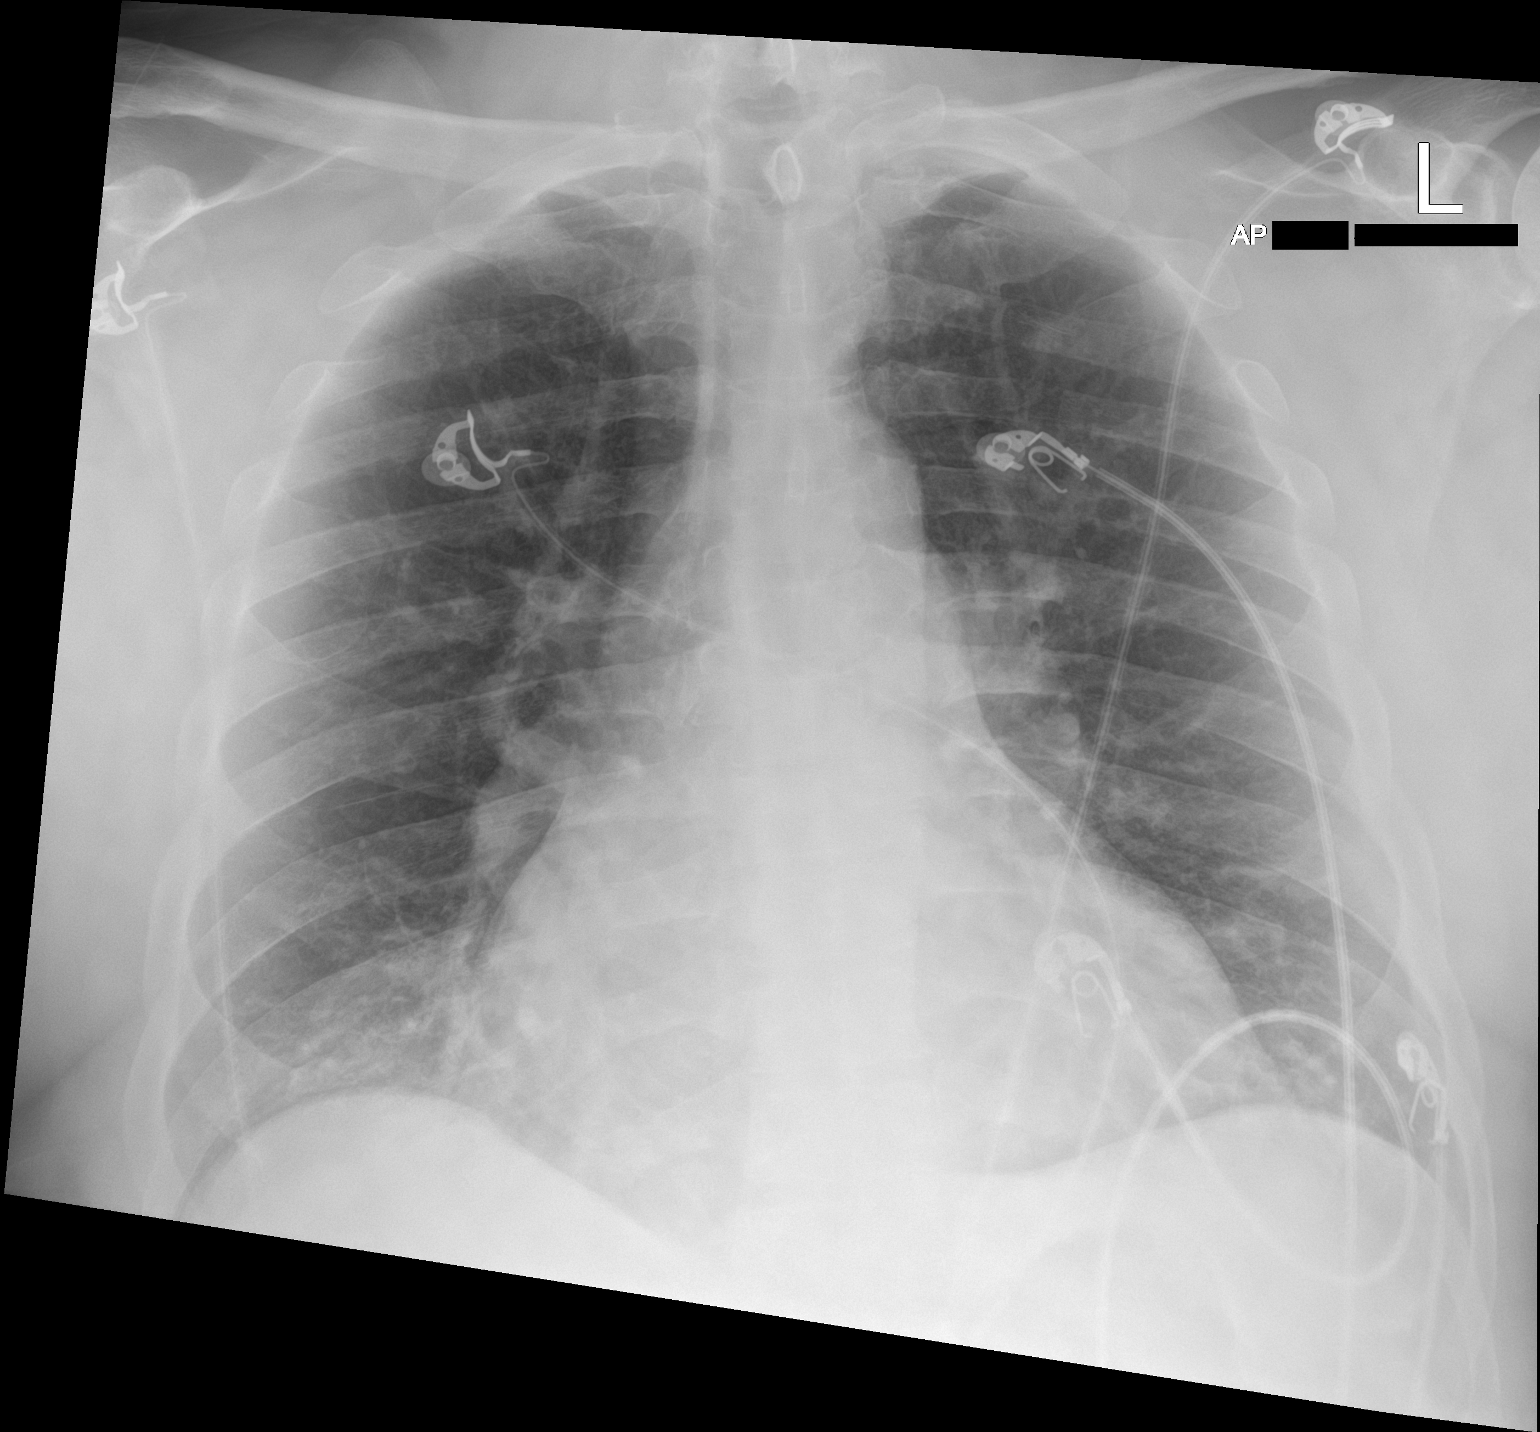

[1 of 1 positions shown; findings below may reference images not displayed]

FINDINGS: Portable AP semi upright view at 2964 hours. Mild to moderate
cardiomegaly appears stable allowing for differences in technique.
Other mediastinal contours are within normal limits. Visualized
tracheal air column is within normal limits. Asymmetric increased
markings at the right lung base are stable. Elsewhere when allowing
for portable technique the lungs are clear. No pneumothorax or
pleural effusion. No acute osseous abnormality identified.
IMPRESSION: No acute cardiopulmonary abnormality.

## 2022-03-27 IMAGING — DX DG CHEST 1V PORT
1 series · 1 of 1 positions shown · non-contrast
Comparison: 06/13/2021

CLINICAL DATA: SOB

EXAM:
PORTABLE CHEST - 1 VIEW

[chest ap]
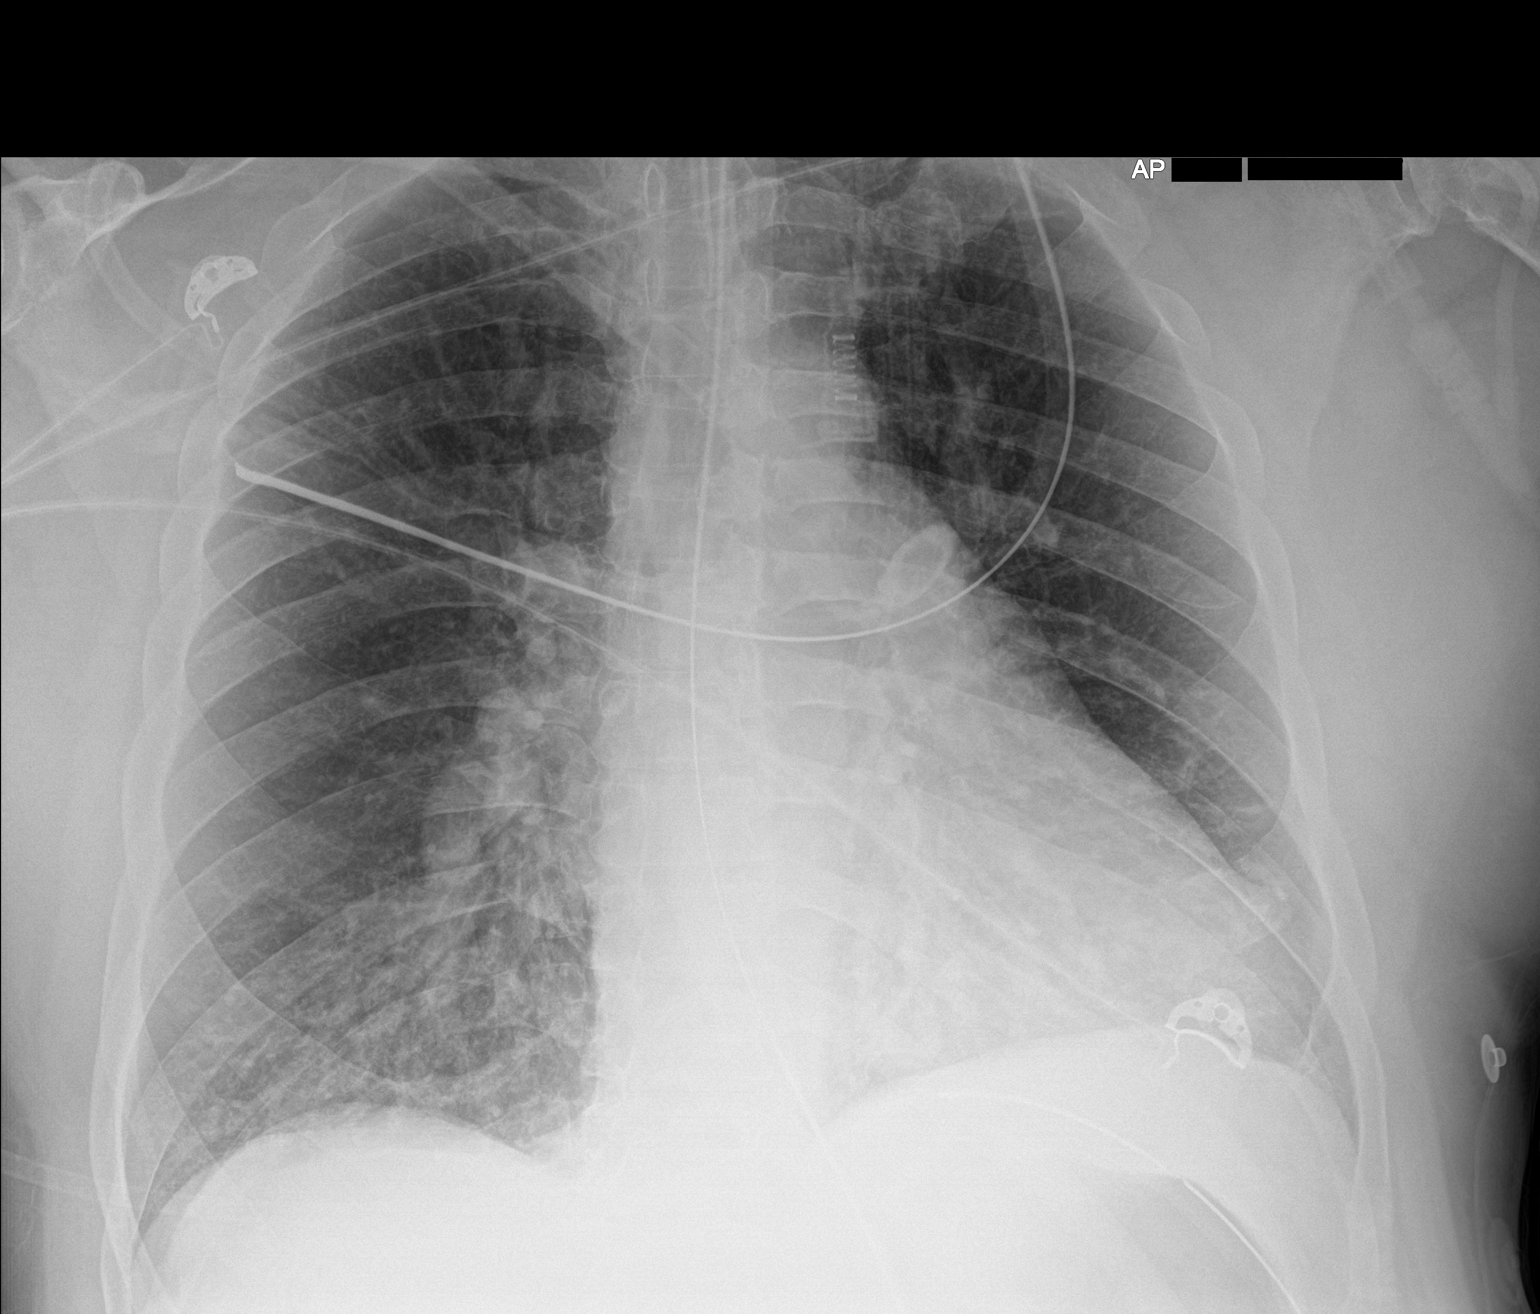

[1 of 1 positions shown; findings below may reference images not displayed]

FINDINGS: Interval intubation, endotracheal tube tip 7.3 cm above carina.
Gastric tube loops in the stomach. There are mild patchy airspace
opacities in the lung bases, new since previous.

Heart size and mediastinal contours are within normal limits.

No effusion.  No pneumothorax.

Degenerative changes in the left shoulder.
IMPRESSION: 1. Endotracheal tube and nasogastric tube in good position.
2. Mild patchy airspace opacities in both lung bases, new since
previous.

## 2022-03-29 IMAGING — DX DG CHEST 1V PORT
1 series · 1 of 1 positions shown · non-contrast
Comparison: 06/13/2021

CLINICAL DATA: Respiratory failure and re-intubation.

EXAM:
PORTABLE CHEST 1 VIEW

[chest ap]
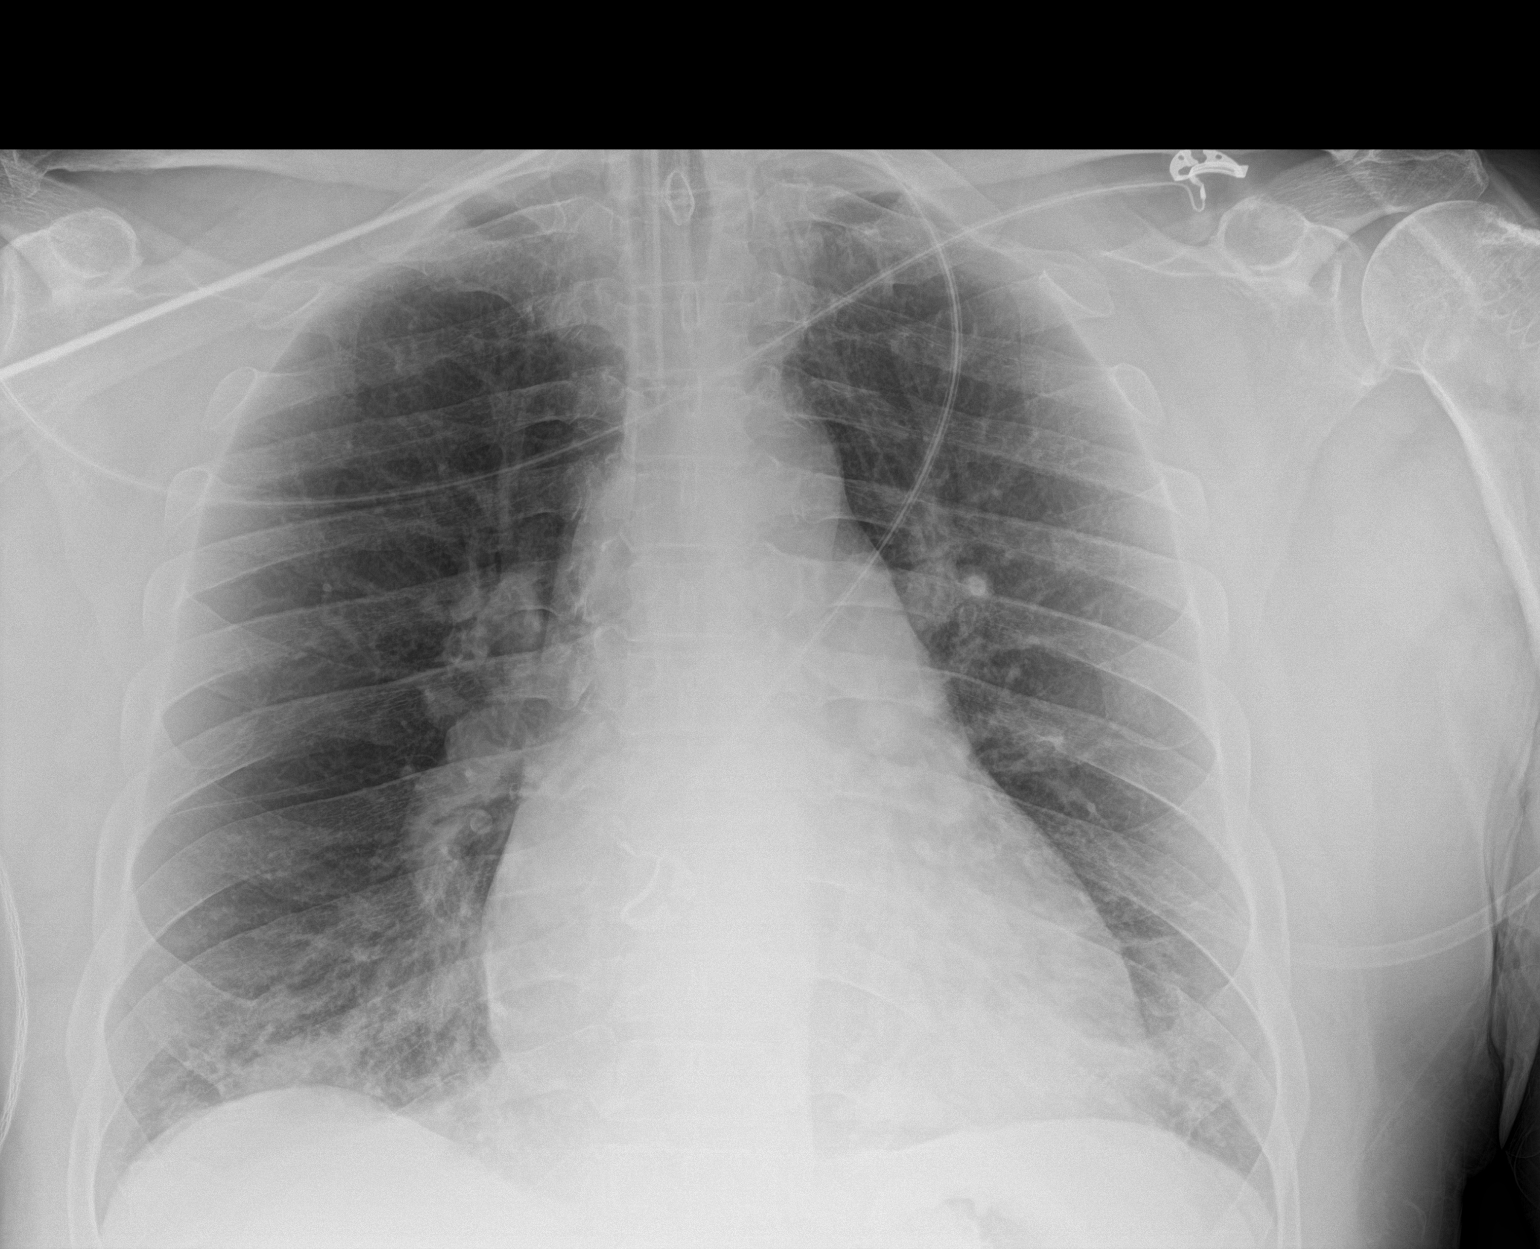

[1 of 1 positions shown; findings below may reference images not displayed]

FINDINGS: Stable cardiac enlargement. Endotracheal tube present with the tip
approximately 4 cm above the carina. There may be an orogastric or
nasogastric tube coiled in the pharynx/larynx at the top of the
chest x-ray. Lungs demonstrate persistent bibasilar opacities,
slightly greater on the right. Chronic lung disease appears stable.
No pneumothorax or significant pleural fluid.
IMPRESSION: Endotracheal tube tip approximately 4 cm above the carina. Possible
coiled gastric decompression tube in the pharynx/larynx. Bibasilar
opacities remain present, slightly more prominent on the right.

## 2022-03-29 IMAGING — DX DG ABDOMEN 1V
1 series · 1 of 1 positions shown · non-contrast
Comparison: None.

CLINICAL DATA: Feeding tube placement.

EXAM:
ABDOMEN - 1 VIEW

[abdomen kub]
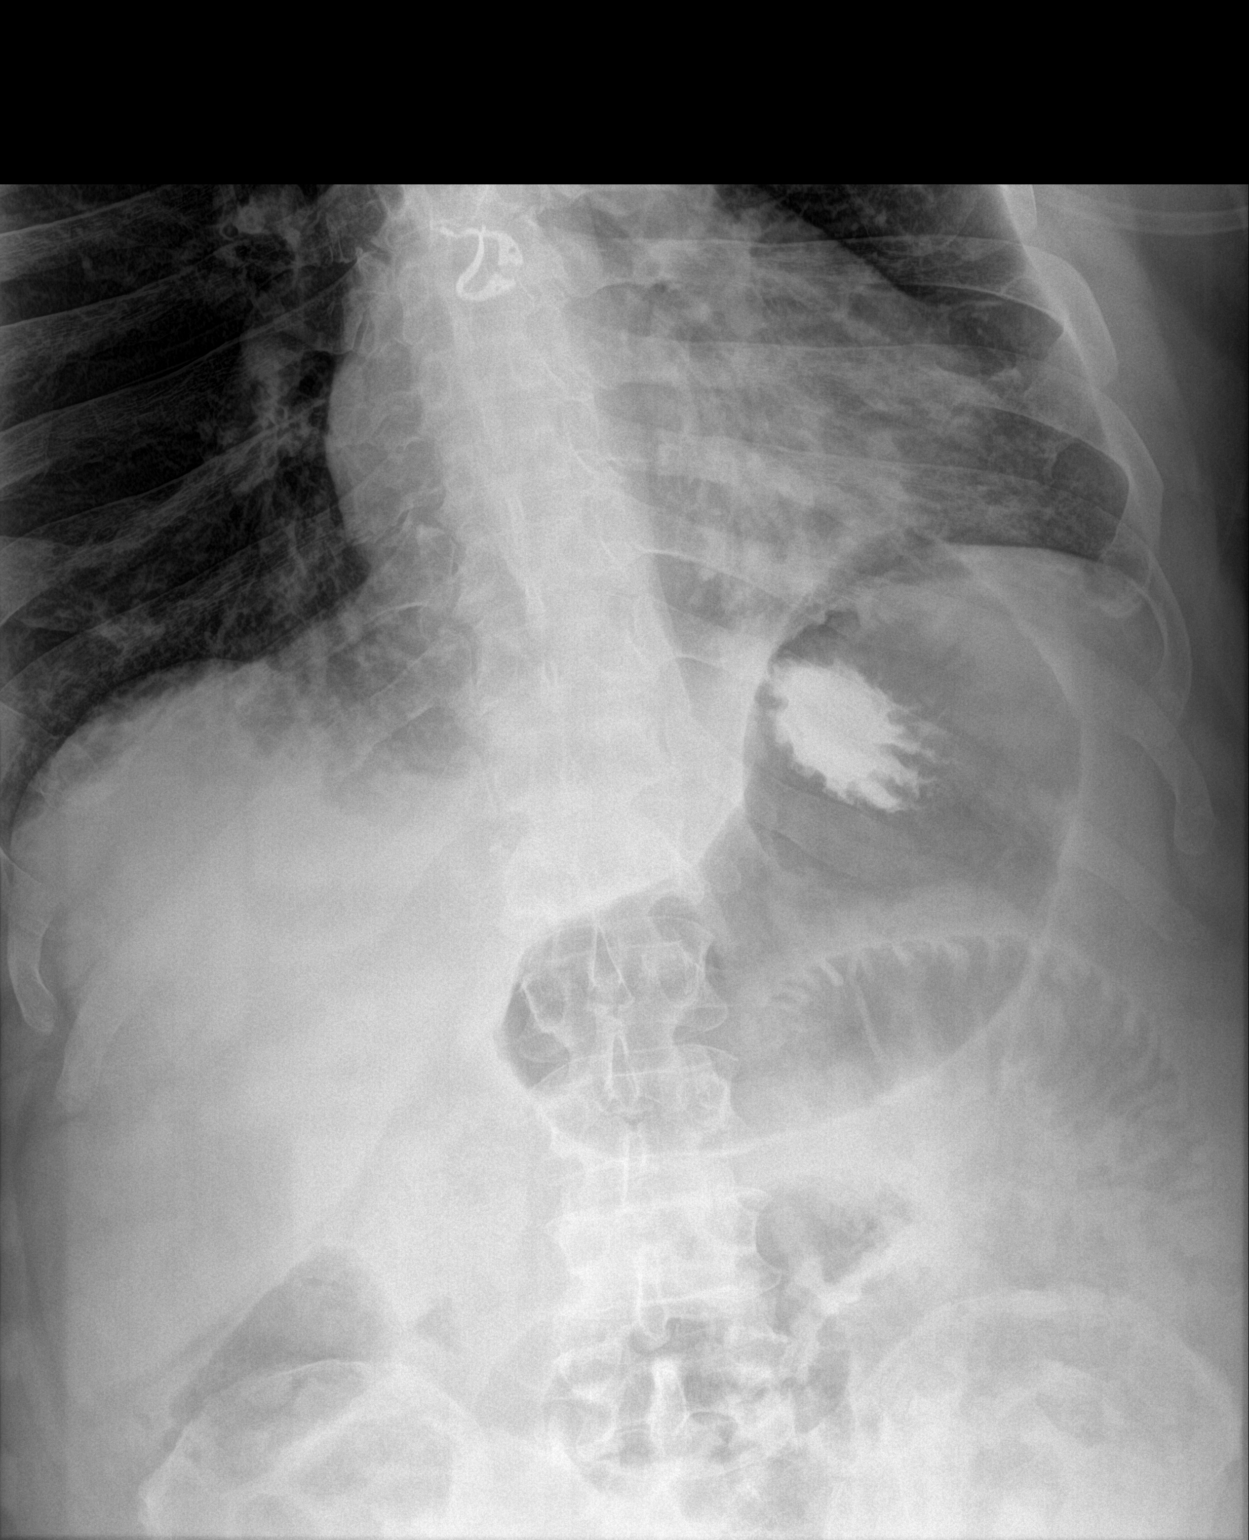

[1 of 1 positions shown; findings below may reference images not displayed]

FINDINGS: No feeding tube is seen on this exam. A small amount of oral
contrast is seen within the fundus of the stomach. No evidence of
dilated bowel loops. Heterogeneous airspace disease seen in the
retrocardiac left lower lobe.
IMPRESSION: No feeding tube seen on this exam.  Unremarkable bowel gas pattern.

Left lower lobe airspace disease.

## 2022-03-29 IMAGING — DX DG ABDOMEN 1V
1 series · 1 of 1 positions shown · non-contrast
Comparison: Abdominal radiograph from earlier today

CLINICAL DATA: Feeding tube placed

EXAM:
ABDOMEN - 1 VIEW

[abdomen kub]
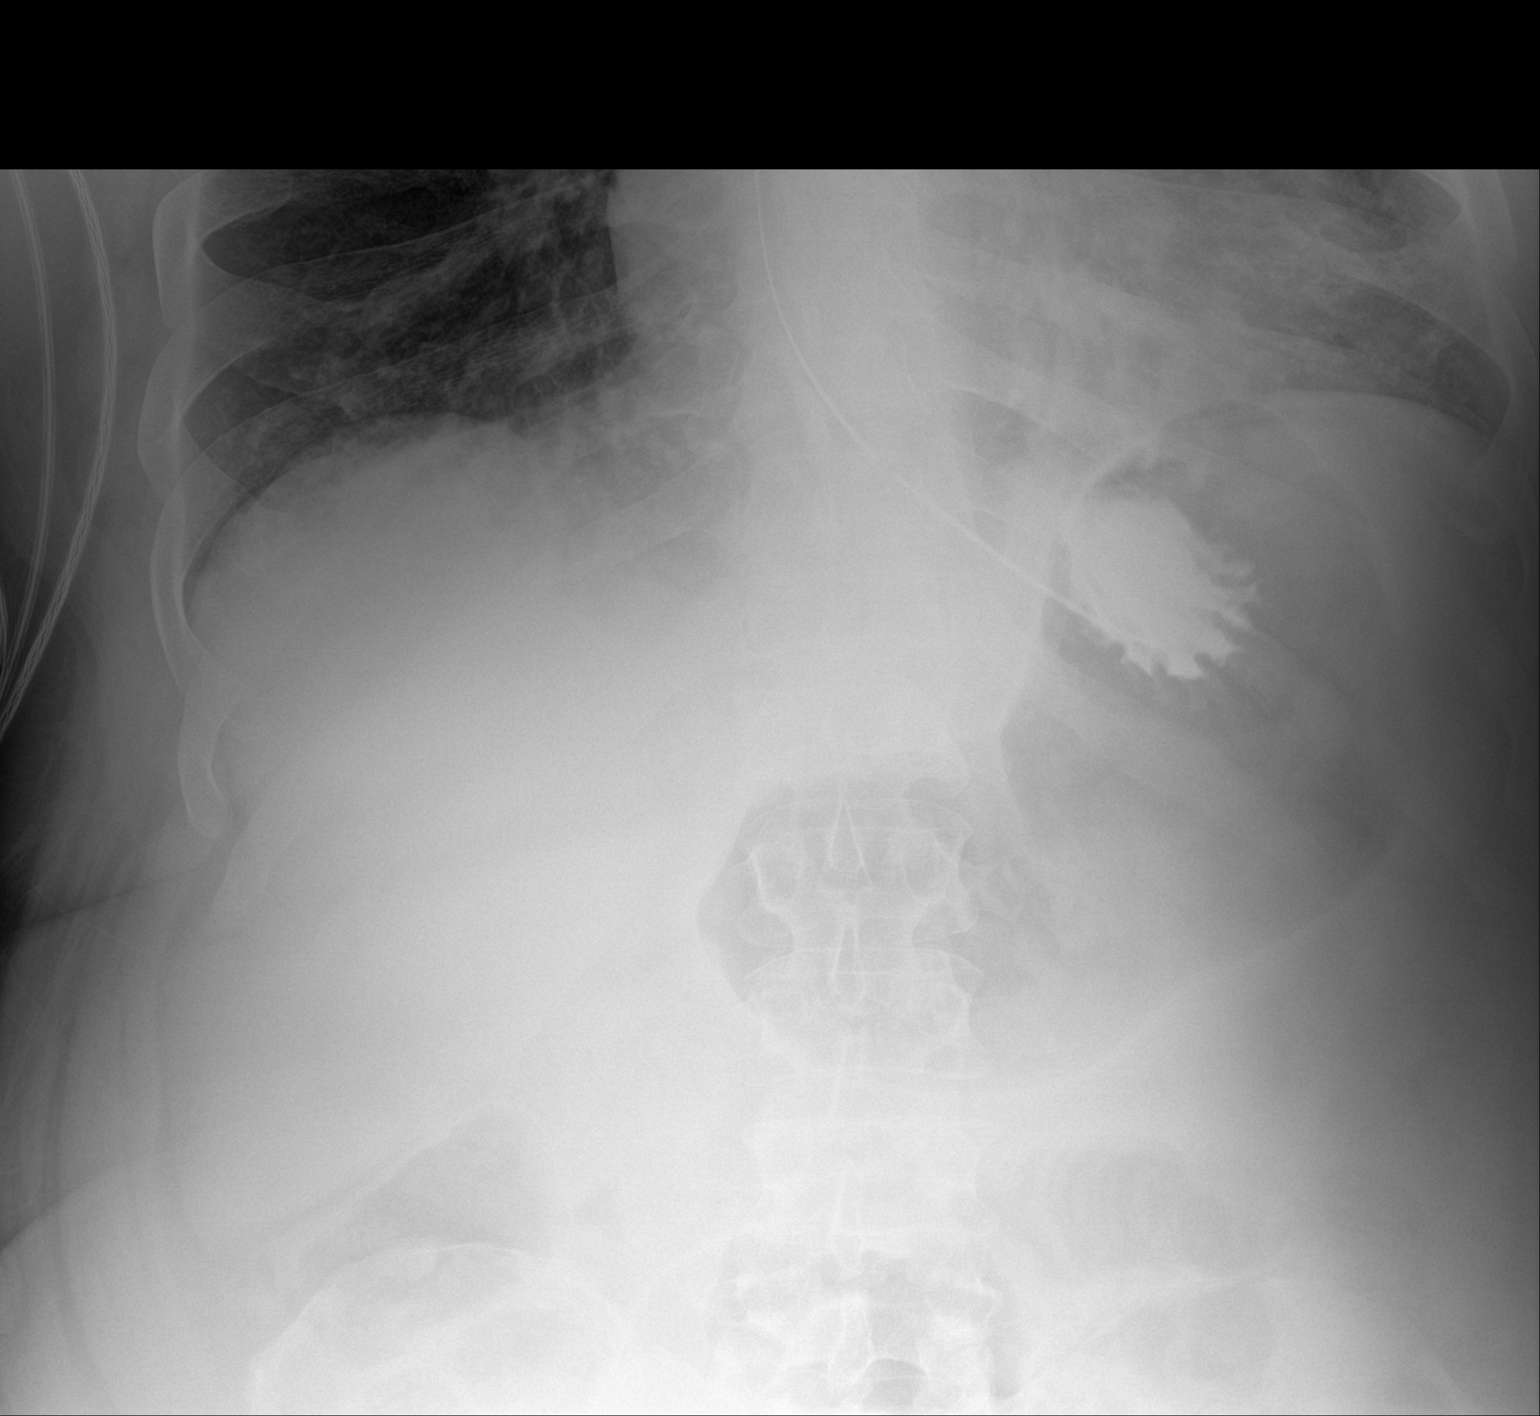

[1 of 1 positions shown; findings below may reference images not displayed]

FINDINGS: Enteric tube terminates in the proximal stomach with the side port
in the lower thoracic esophagus. Small amount of oral contrast
layering in the gastric fundus. Dilated small bowel loops in the
visualized upper abdomen, unchanged. No evidence of pneumatosis or
pneumoperitoneum.
IMPRESSION: Enteric tube terminates in the proximal stomach with the side-port
in the lower thoracic esophagus. Suggest advancing 5 cm.

Dilated small bowel loops in the visualized upper abdomen,
unchanged, cannot exclude ileus or small-bowel obstruction.

## 2022-03-30 IMAGING — DX DG CHEST 1V PORT
1 series · 1 of 1 positions shown · non-contrast
Comparison: June 15, 2021

CLINICAL DATA: Endotracheal tube, aspiration.

EXAM:
PORTABLE CHEST 1 VIEW

[chest]
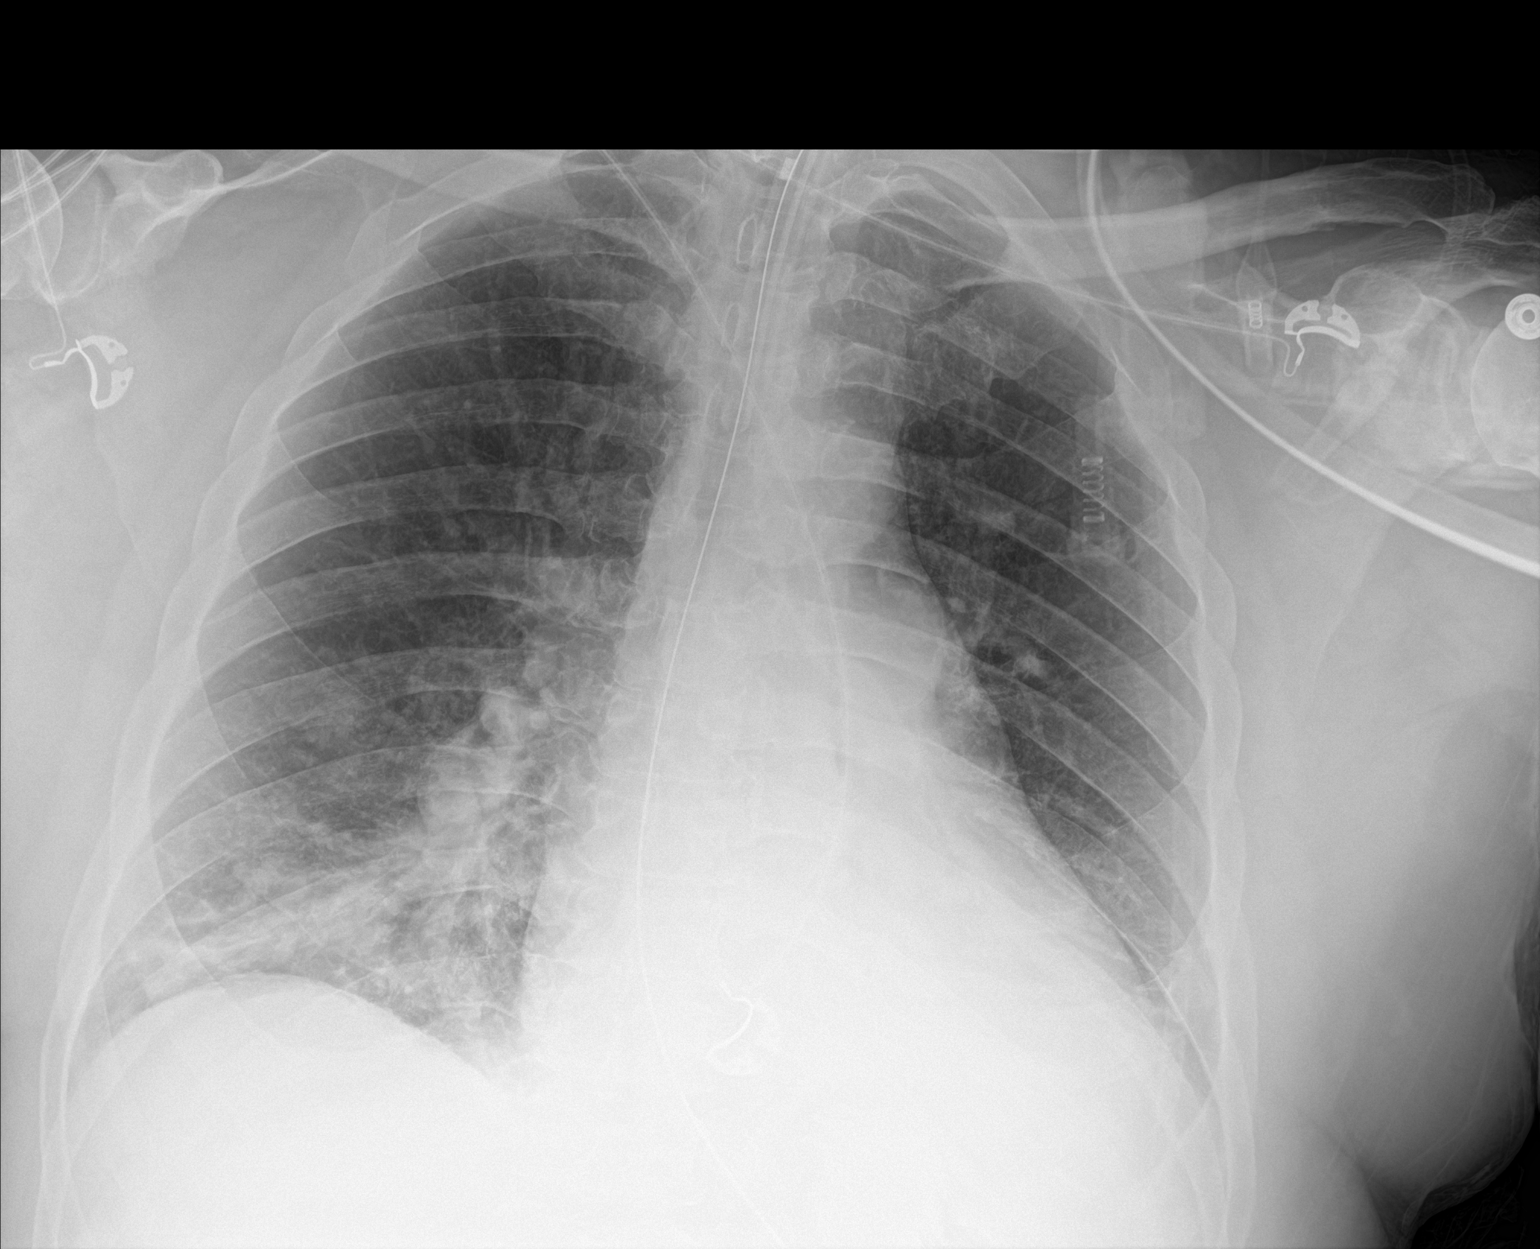

[1 of 1 positions shown; findings below may reference images not displayed]

FINDINGS: Endotracheal tube with tip projecting over the midthoracic trachea.
Enteric tube courses below the diaphragm with tip obscured by
collimation. Again seen is the possible coiled nasogastric tube in
the pharynx/larynx. The heart size and mediastinal contours are
unchanged. Increased streaky bibasilar airspace opacities. The
visualized skeletal structures are unchanged.
IMPRESSION: Increased streaky bibasilar airspace opacities, likely atelectasis
and/or aspiration.

Endotracheal tube with tip in the midthoracic trachea. Again seen is
the possible coiled gastric decompression tube in the
pharynx/larynx.

## 2022-03-30 IMAGING — DX DG CHEST 1V PORT
1 series · 1 of 1 positions shown · non-contrast
Comparison: 06/16/2021

CLINICAL DATA: Hypoxemia

EXAM:
PORTABLE CHEST 1 VIEW

[chest]
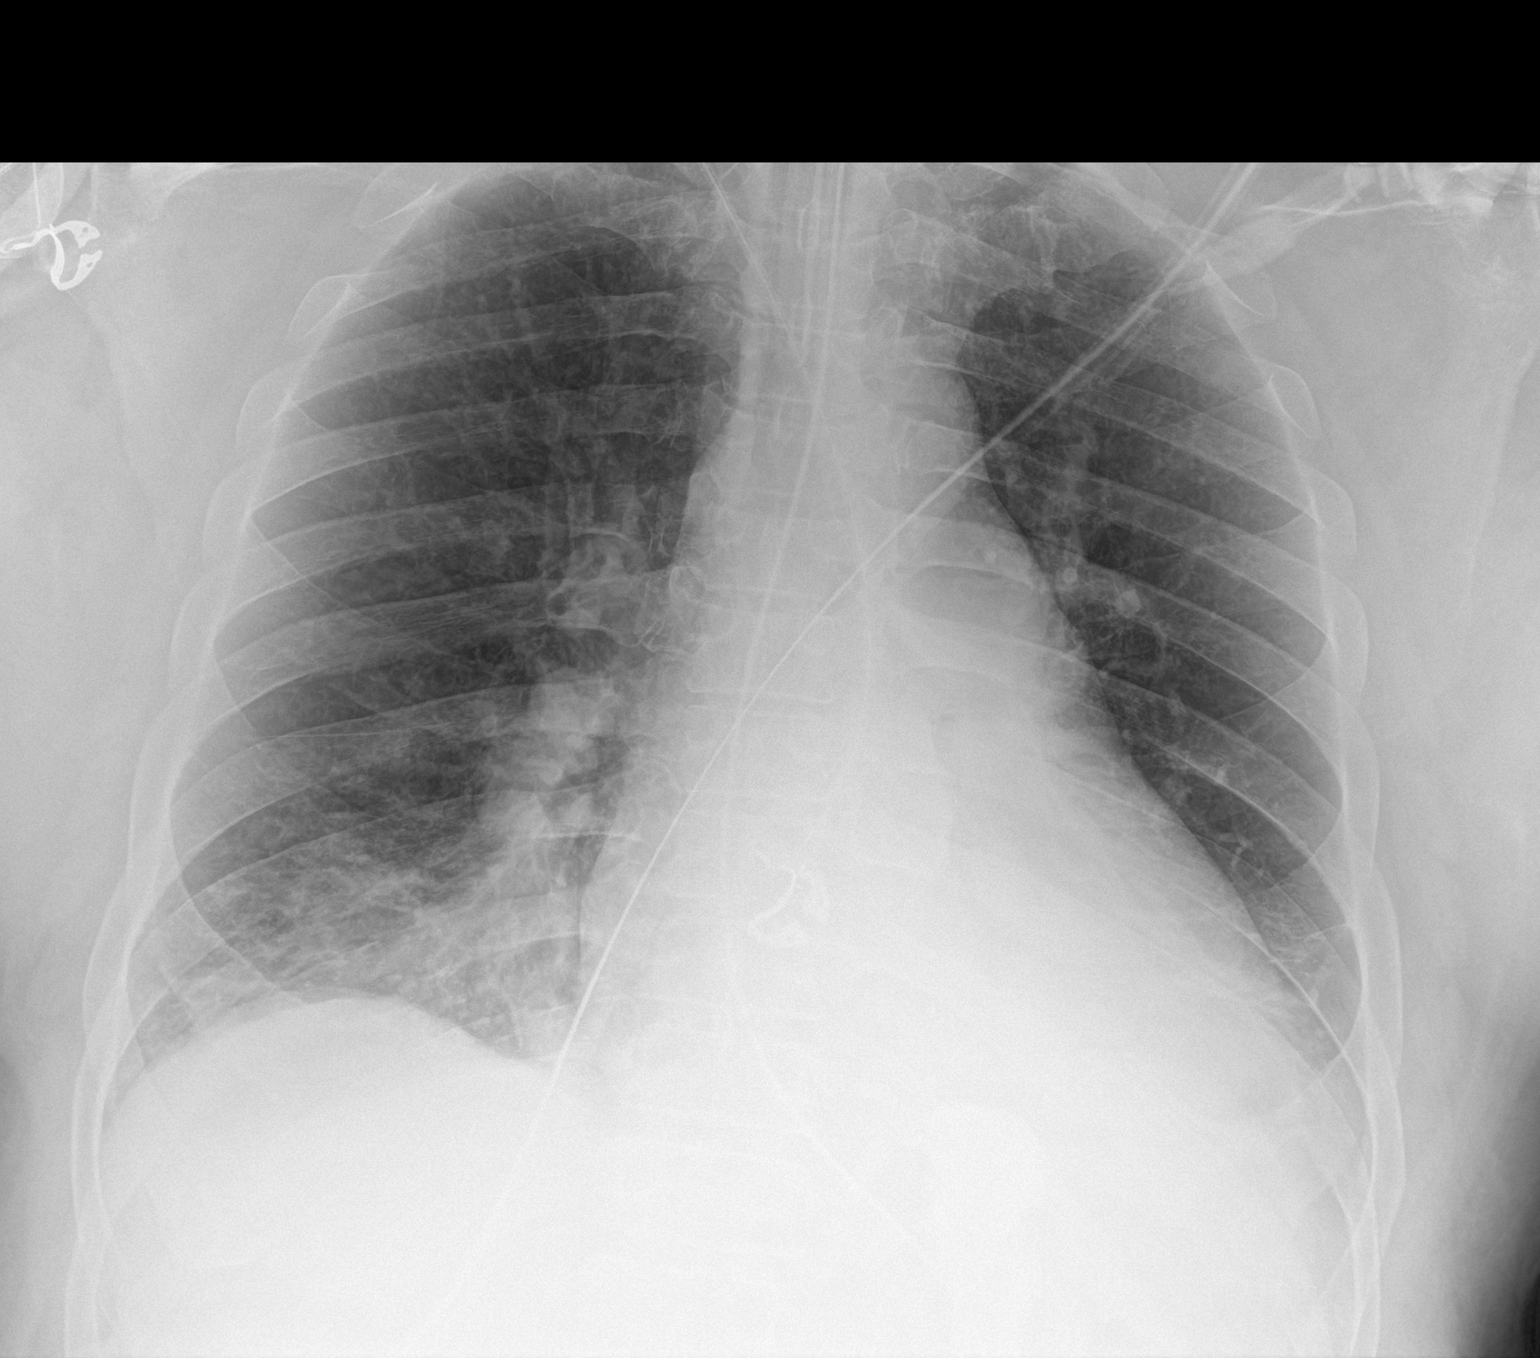

[1 of 1 positions shown; findings below may reference images not displayed]

FINDINGS: The tip of the endotracheal 2 is approximately 4.8 cm below level of
the clavicular heads and approximately 4 cm above the inferior
margin the carina.

There is improved aeration at the right lung base with some residual
opacity.
IMPRESSION: Improved aeration of the right lung base with some residual opacity.

## 2022-03-30 IMAGING — DX DG ABD PORTABLE 1V
1 series · 1 of 1 positions shown · non-contrast
Comparison: None.

CLINICAL DATA: Orogastric tube placement

EXAM:
PORTABLE ABDOMEN - 1 VIEW

[abdomen]
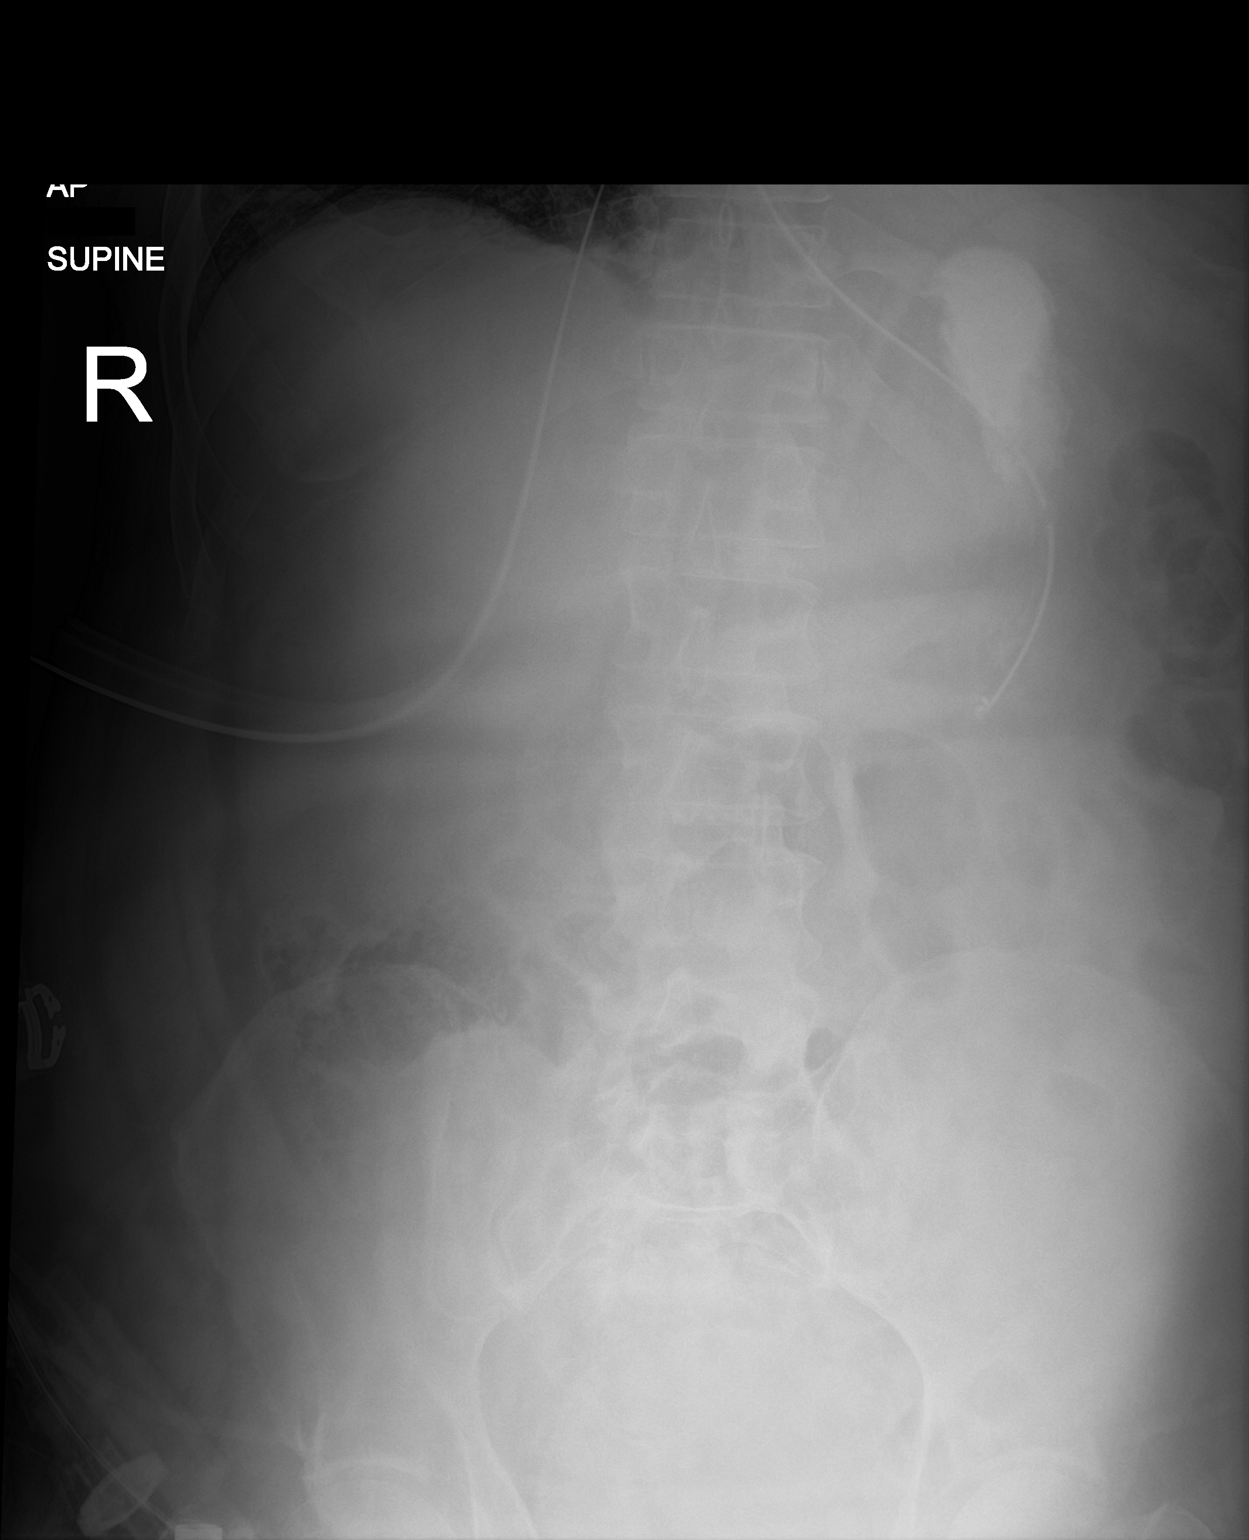

[1 of 1 positions shown; findings below may reference images not displayed]

FINDINGS: Tip and side port of the orogastric tube are in the stomach.
IMPRESSION: Orogastric tube tip in the stomach.

## 2022-03-31 IMAGING — DX DG ABDOMEN 1V
1 series · 1 of 1 positions shown · non-contrast
Comparison: None.

CLINICAL DATA: Feeding tube placement.

EXAM:
ABDOMEN - 1 VIEW

[abdomen]
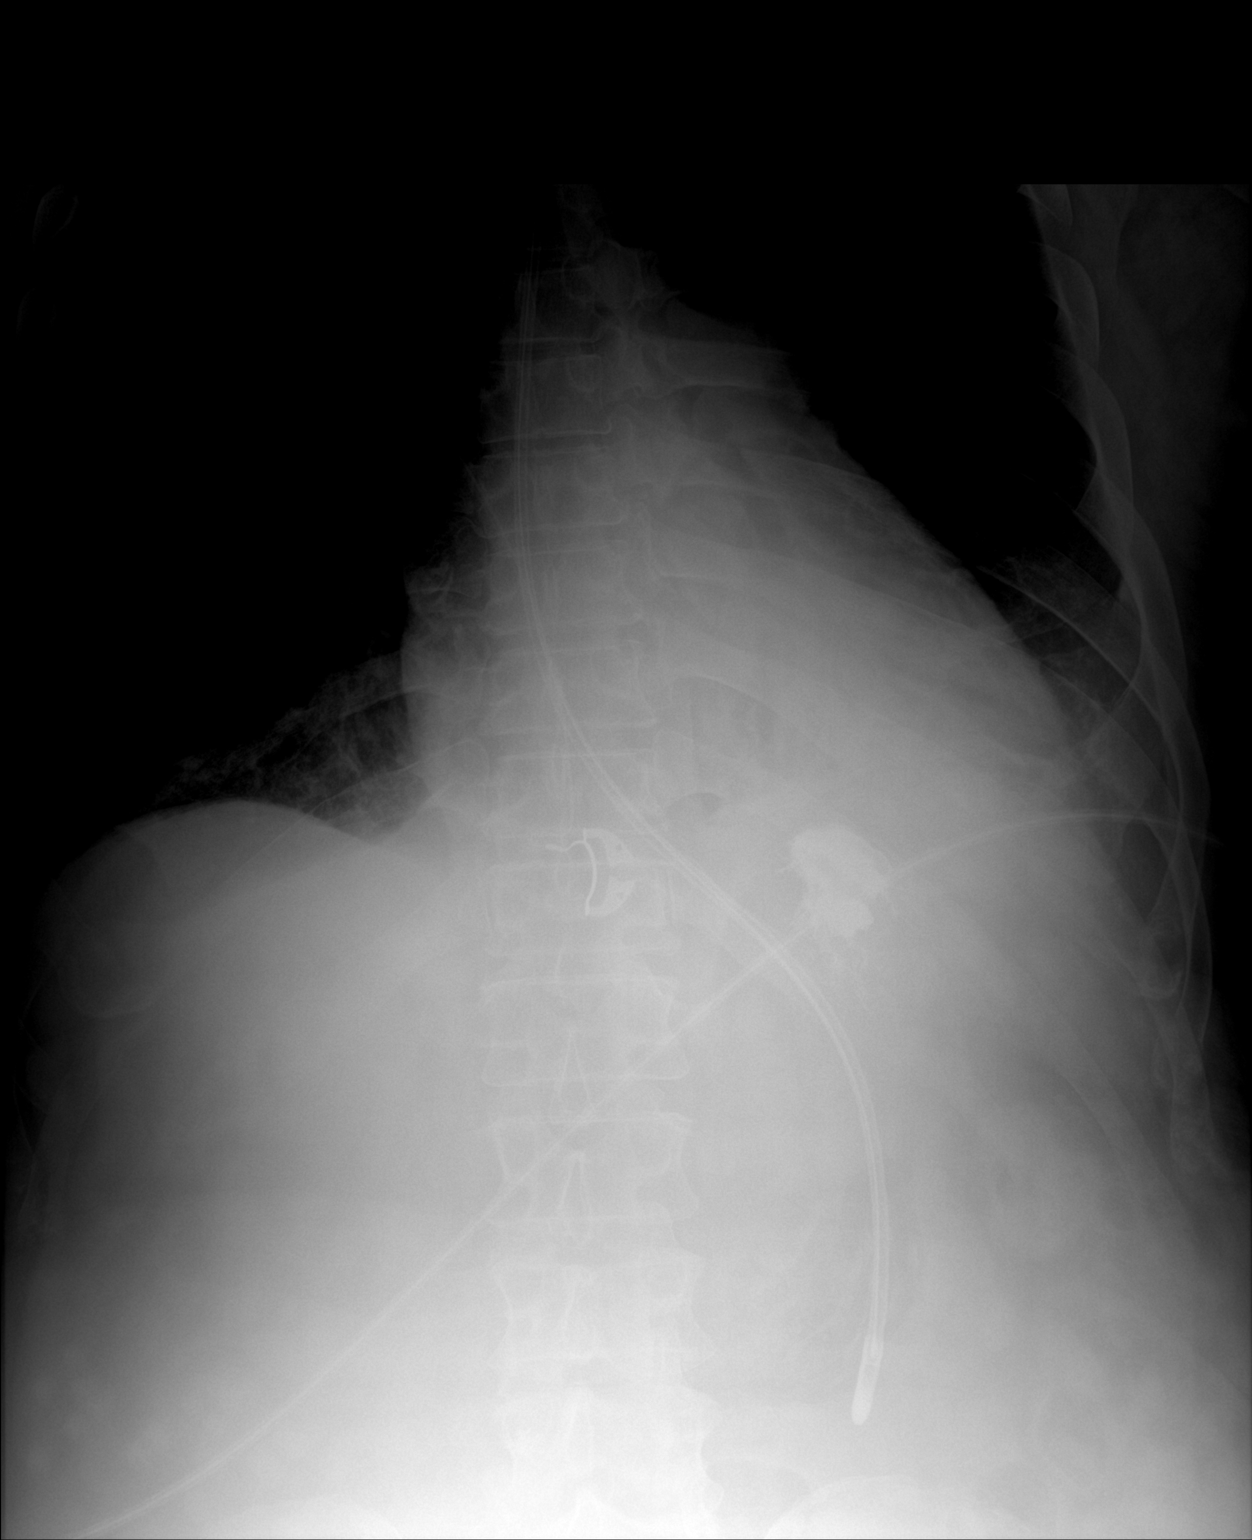

[1 of 1 positions shown; findings below may reference images not displayed]

FINDINGS: The bowel gas pattern is normal. Distal tip of feeding tube is seen
in expected position of the stomach. No radio-opaque calculi or
other significant radiographic abnormality are seen.
IMPRESSION: Distal tip of feeding tube seen in expected position of the stomach.

## 2022-03-31 IMAGING — DX DG CHEST 1V PORT
1 series · 1 of 1 positions shown · non-contrast
Comparison: Chest radiograph dated 06/17/2021.

CLINICAL DATA: History of CHF and COPD.  ECMO.

EXAM:
PORTABLE CHEST 1 VIEW

[chest]
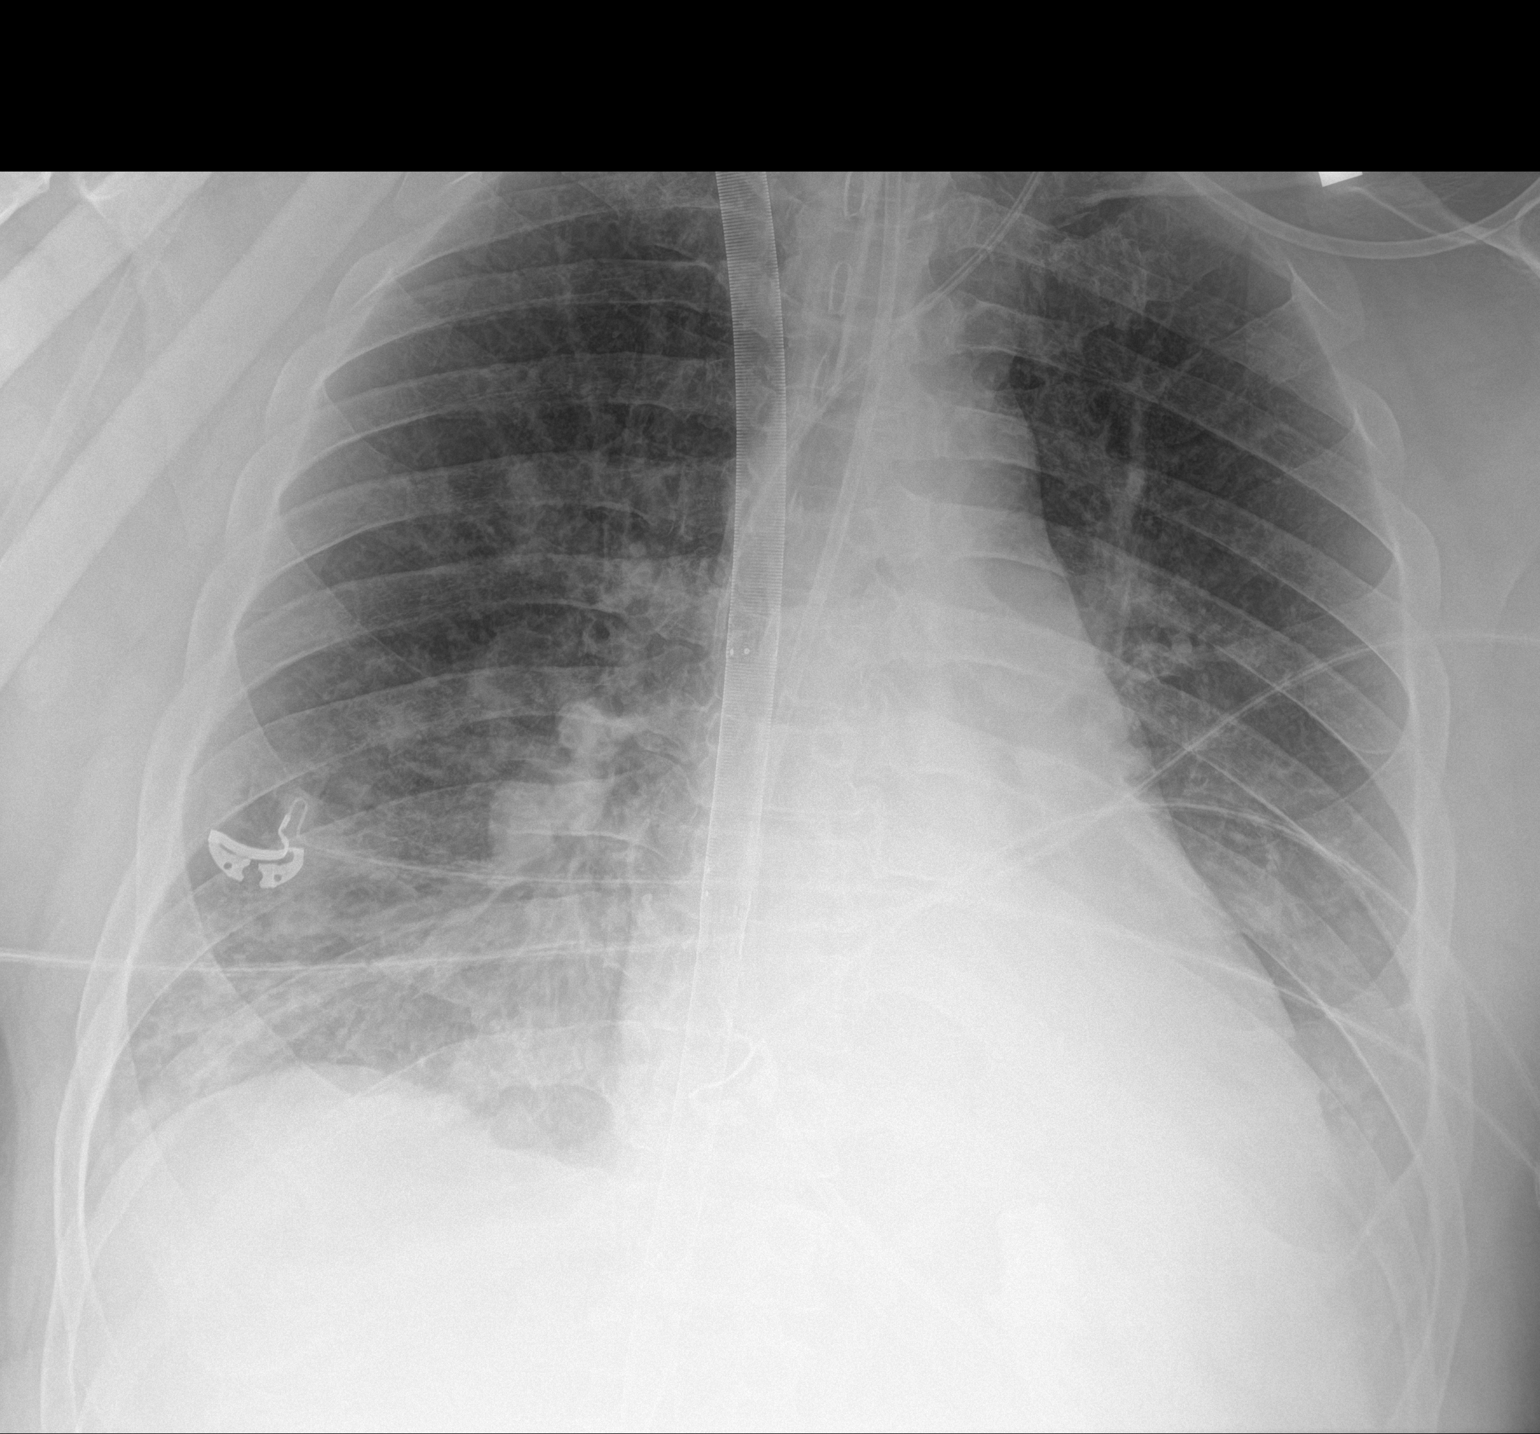

[1 of 1 positions shown; findings below may reference images not displayed]

FINDINGS: Interval removal of the previously seen right IJ central venous line
and placement of a cannula extending down into the IVC. Endotracheal
tube remains above the carina. Feeding tube extends below the
diaphragm with tip beyond the inferior margin of the image. Left IJ
central venous line with tip over central SVC. Bilateral mid to
lower lung field streaky and confluent densities similar to prior
radiograph. Probable small bilateral pleural effusions. No
pneumothorax. Stable cardiomegaly. No acute osseous pathology.
IMPRESSION: 1. Interval removal of the previously seen right IJ central venous
line and placement of a cannula extending into the IVC.
2. Stable cardiomegaly. Stable bilateral mid to lower lung field
streaky and confluent densities.

## 2022-03-31 IMAGING — DX DG CHEST 1V PORT
1 series · 2 of 2 positions shown · non-contrast
Comparison: Radiographs dated June 16, 2021

CLINICAL DATA: Evaluation of endotracheal and orogastric tube

EXAM:
PORTABLE CHEST 1 VIEW

[Series 1: chest ap · 0.14mm/px · 2 of 2 slices shown]
[im 1/2]
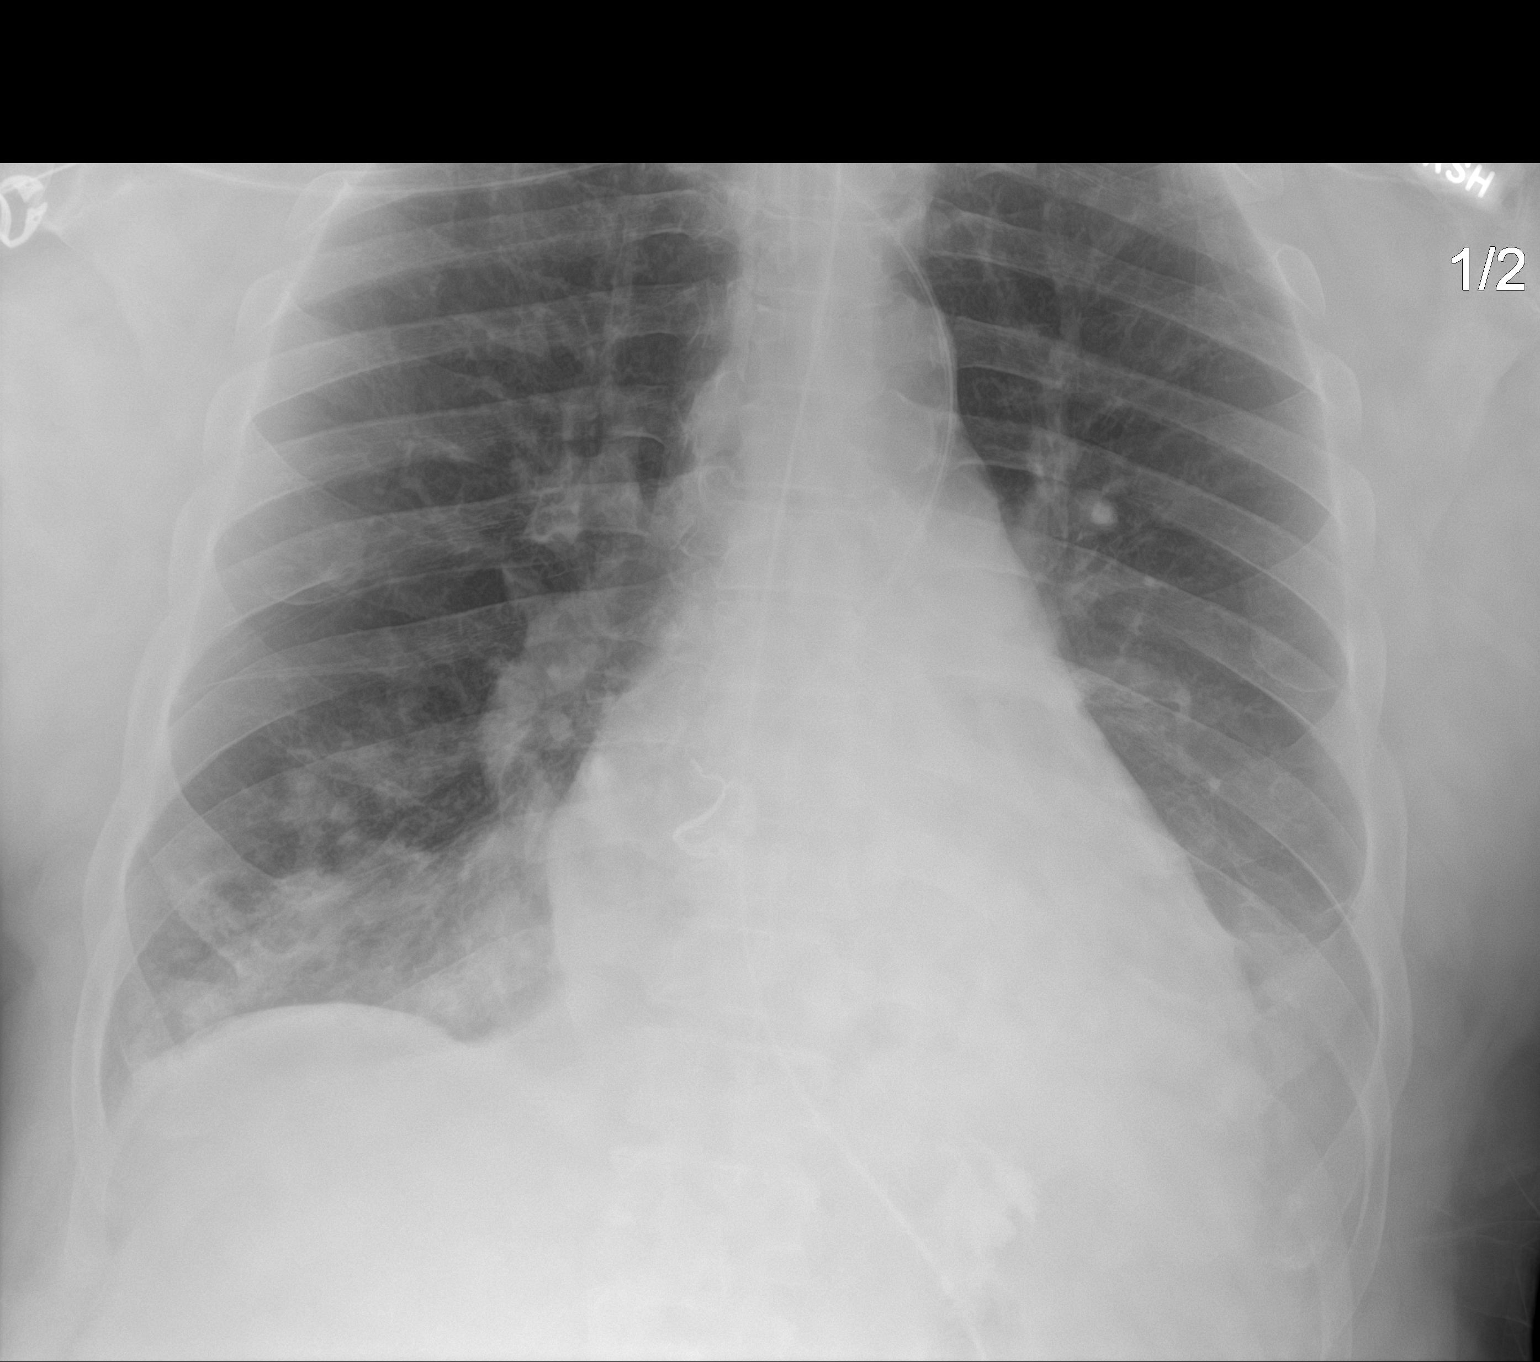
[im 2/2]
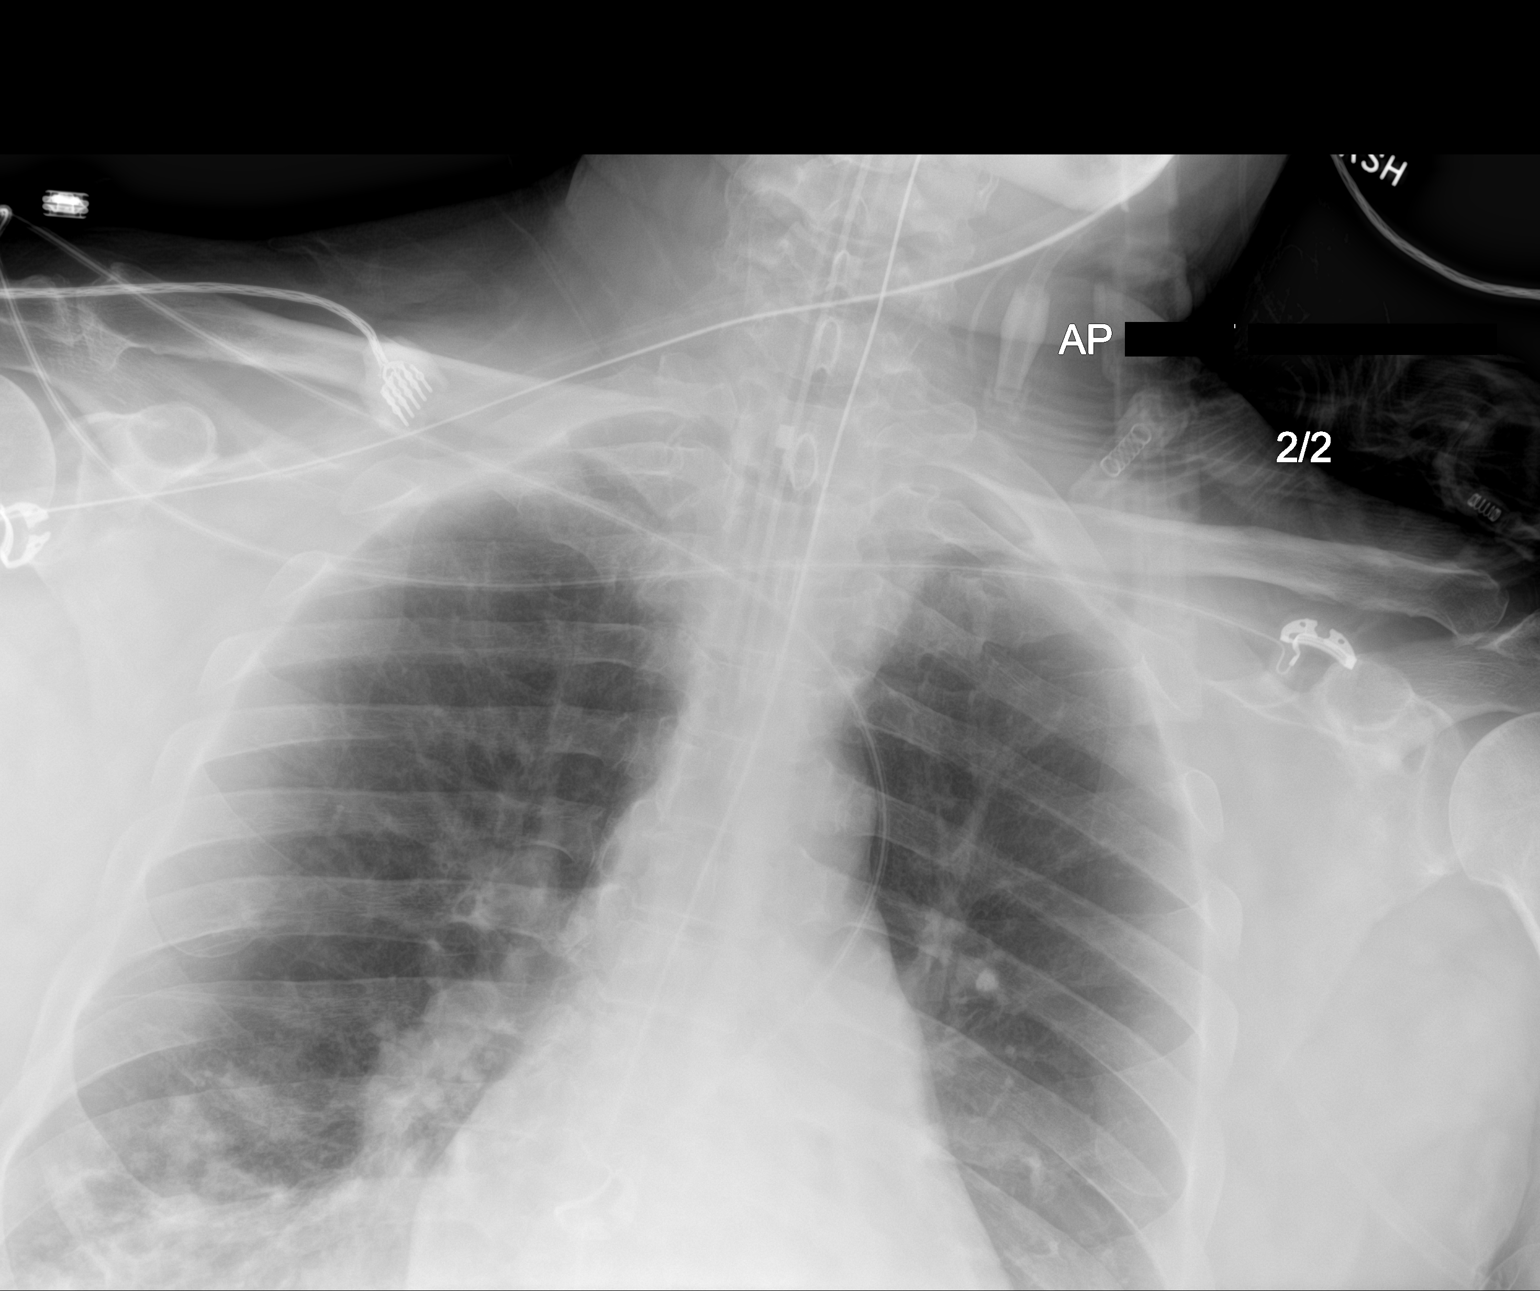

[2 of 2 positions shown; findings below may reference images not displayed]

FINDINGS: The heart size and mediastinal contours are within normal limits.
Bibasilar atelectasis, right greater than the left, unchanged.
Endotracheal tube with distal tip approximately 4 cm above the
carina. Feeding tube coursing below the diaphragm the distal tip not
included.
IMPRESSION: Endotracheal tube with distal tip approximately 4 cm above the
carina. Feeding tube coursing below the diaphragm with distal tip
not included. No significant interval change and bibasilar lung
opacities, right greater than left concerning for atelectasis or
infiltrate.

## 2022-03-31 IMAGING — DX DG CHEST 1V PORT
1 series · 1 of 1 positions shown · non-contrast
Comparison: 06/17/2021 at 4428 hours

CLINICAL DATA: Central line placement

EXAM:
PORTABLE CHEST 1 VIEW

[chest]
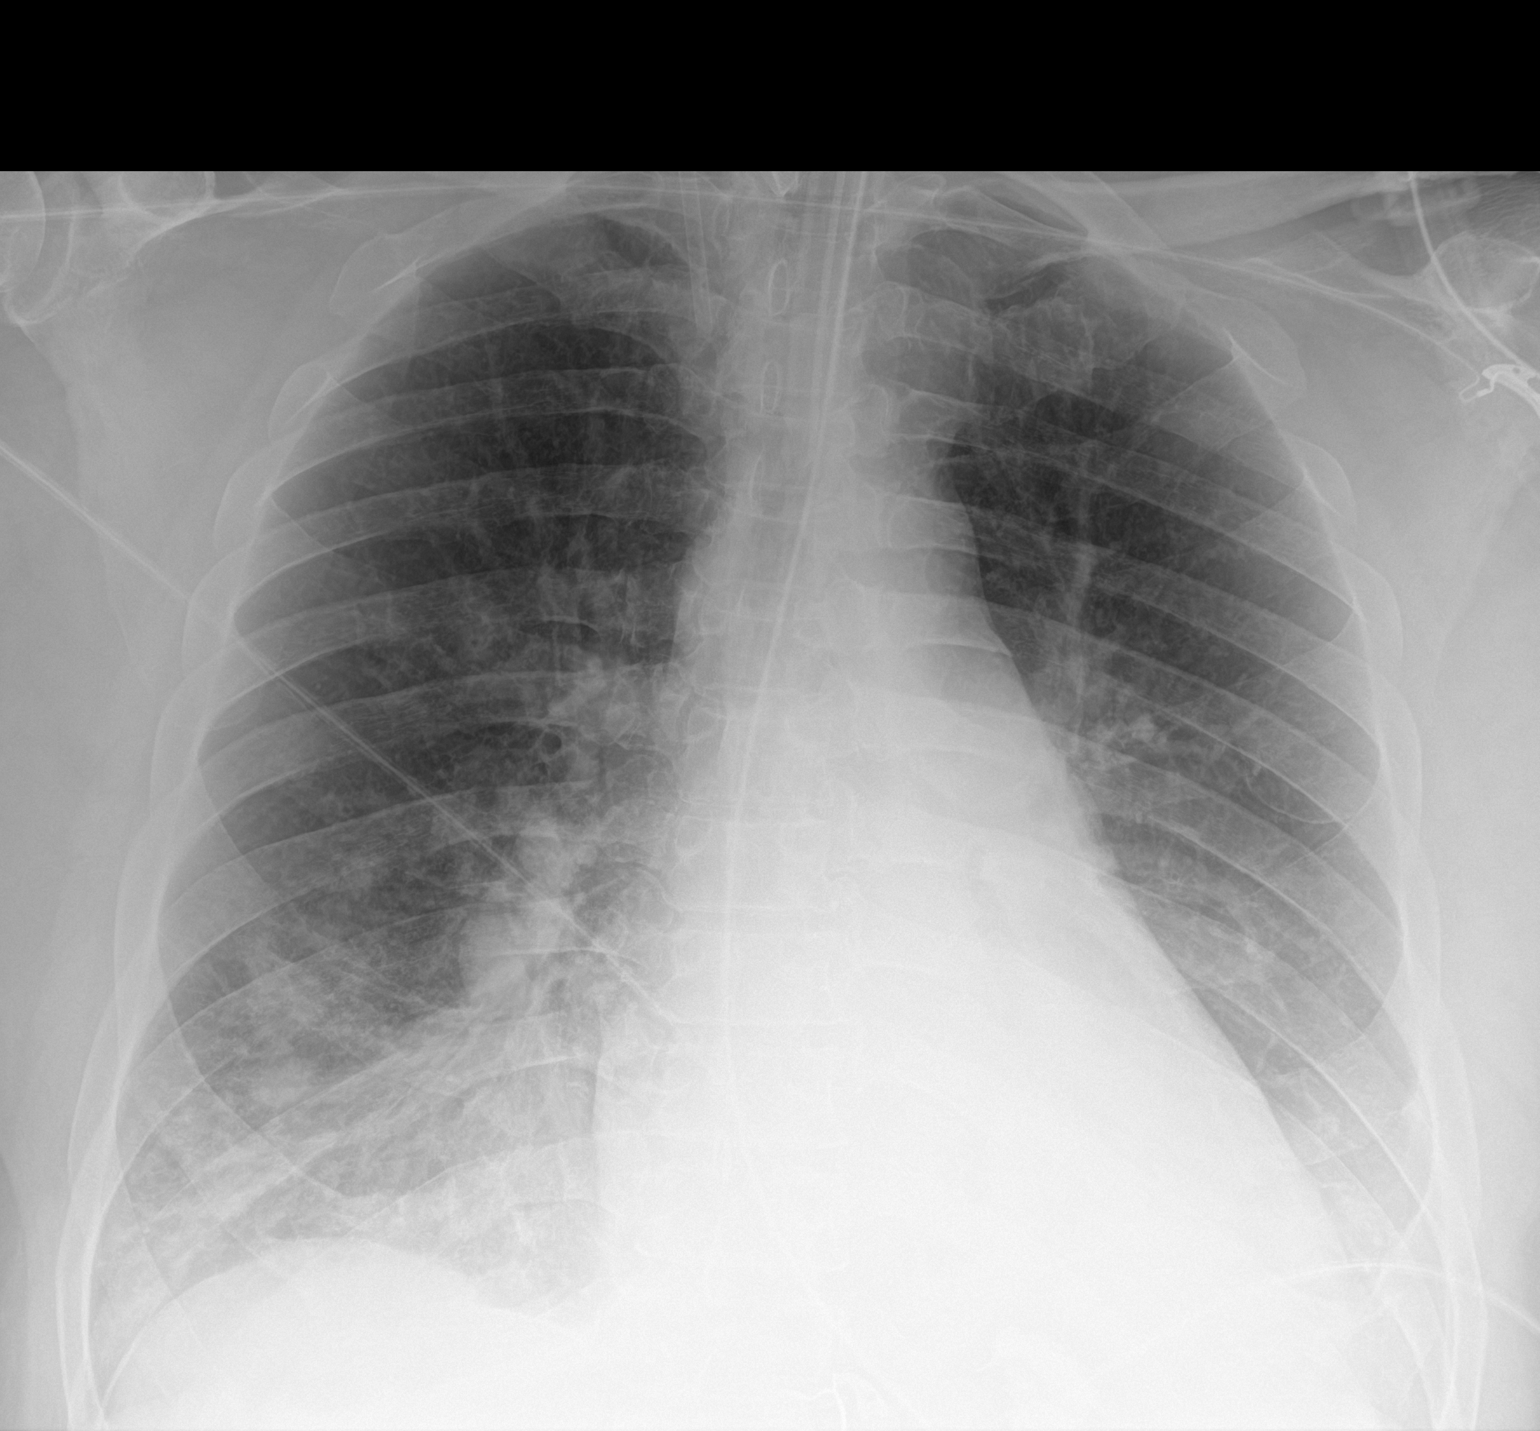

[1 of 1 positions shown; findings below may reference images not displayed]

FINDINGS: Interval placement of right IJ central venous catheter with distal
tip terminating in the region of the proximal SVC. Stable
positioning of ET tube. Enteric tube courses below the diaphragm
with distal tip beyond the inferior margin of the film. Stable heart
size. Persistent bibasilar opacities, right worse than left. No
pneumothorax.
IMPRESSION: Interval placement of right IJ central venous catheter. No
pneumothorax.

## 2022-03-31 IMAGING — DX DG ABD PORTABLE 1V
1 series · 2 of 2 positions shown · non-contrast
Comparison: Yesterday.

CLINICAL DATA: Ileus.

EXAM:
PORTABLE ABDOMEN - 1 VIEW

[Series 1: abdomen · 0.14mm/px · 2 of 2 slices shown]
[im 1/2]
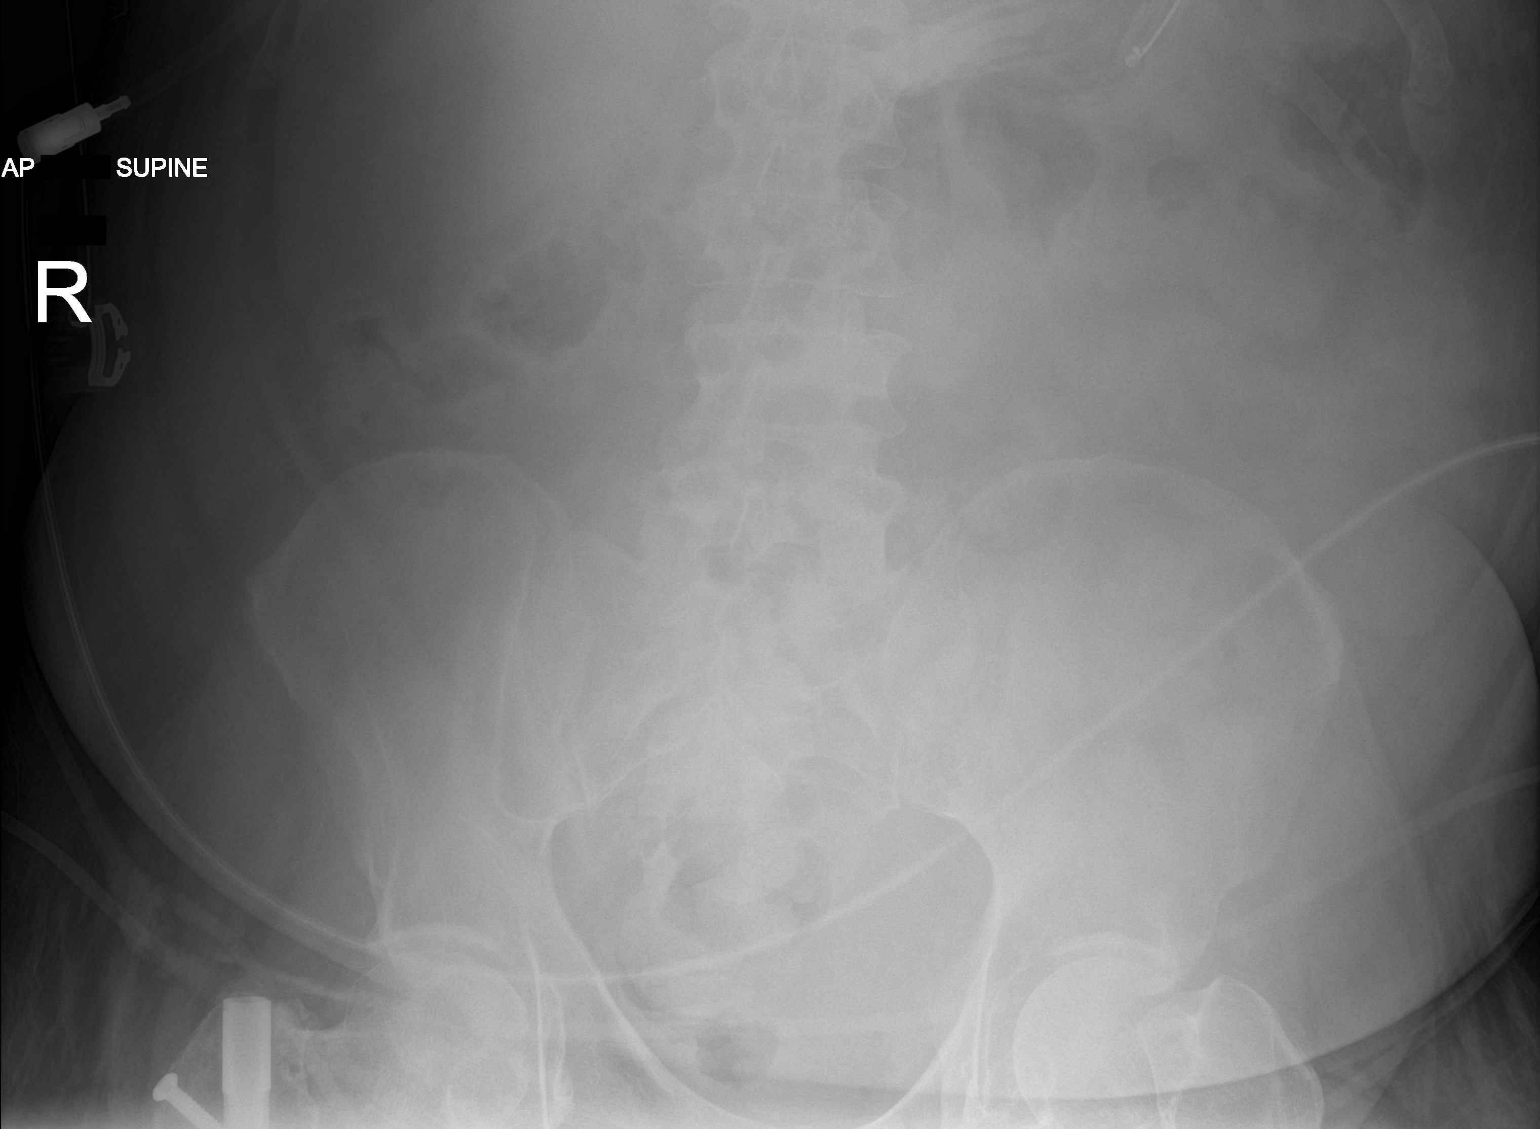
[im 2/2]
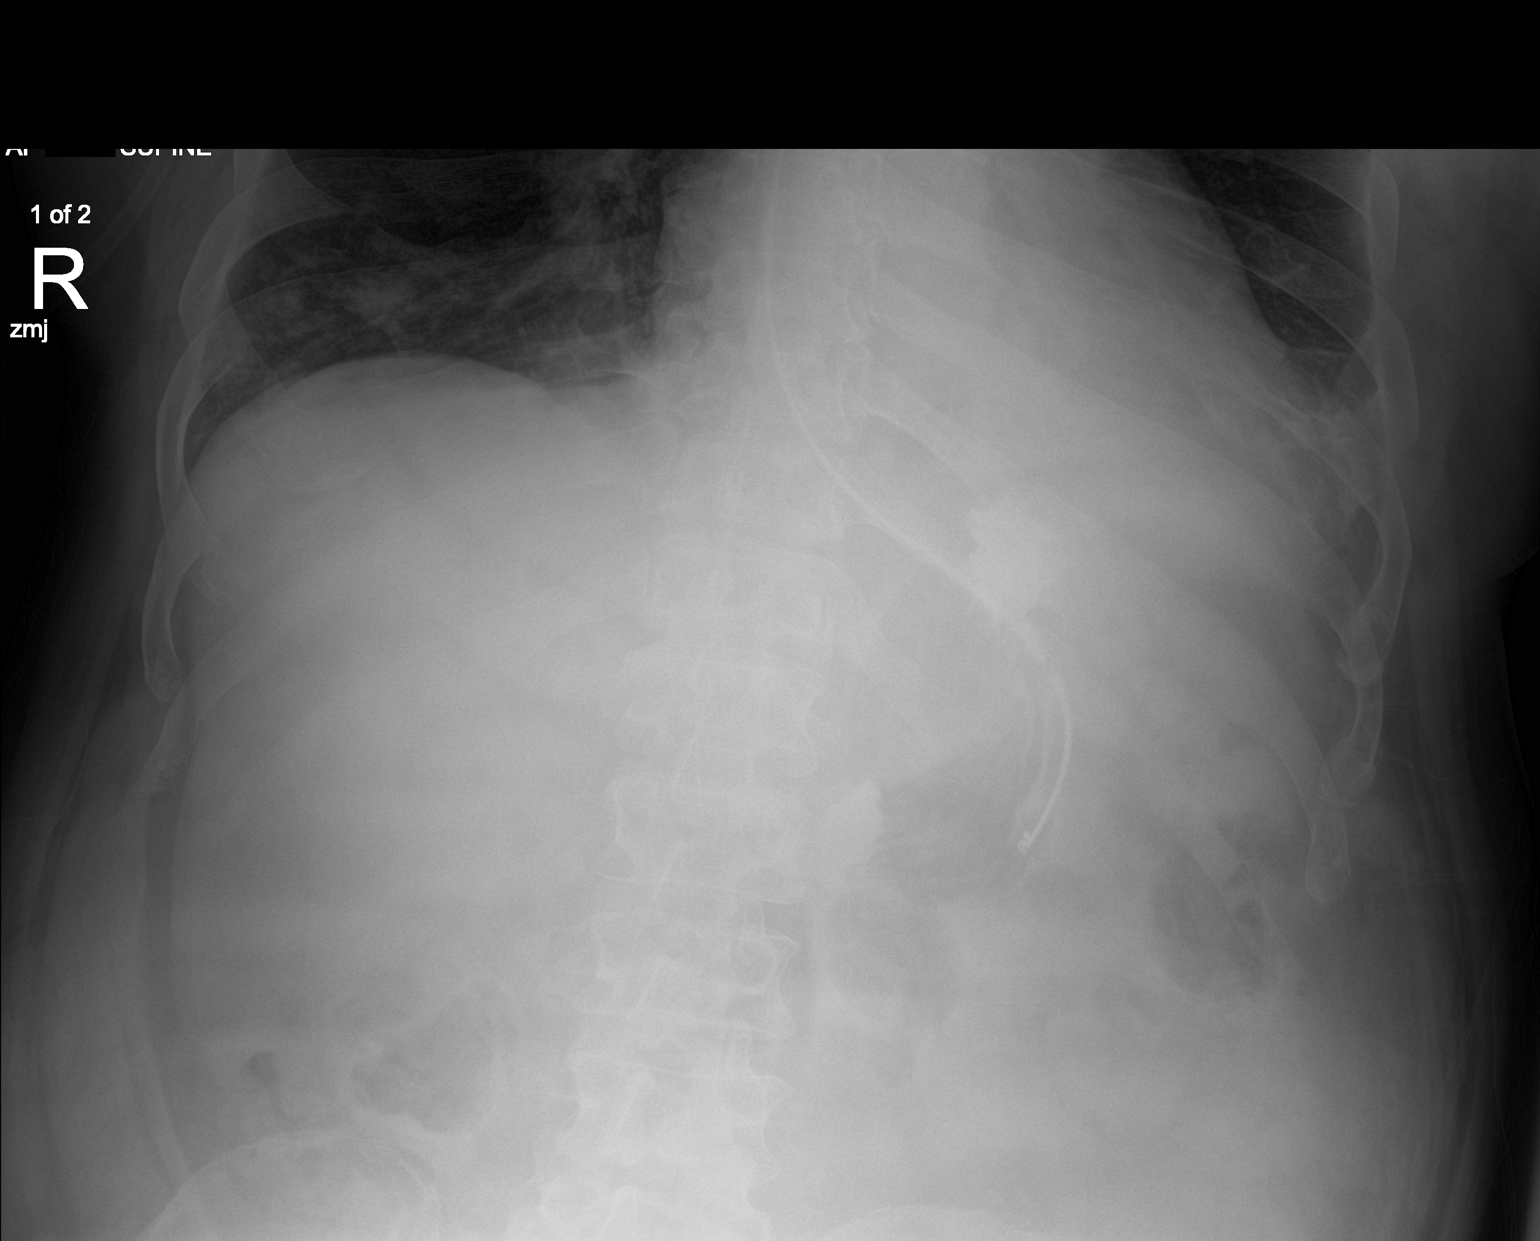

[2 of 2 positions shown; findings below may reference images not displayed]

FINDINGS: Orogastric tube tip in the mid stomach. Less injected contrast in
the gastric fundus with a small amount seen more distally in the
antrum. No bowel contrast or dilatation seen. Interval mild patchy
opacity at the right lung base and continued denser opacity at the
left lung base. Right hip fixation hardware.
IMPRESSION: 1. No radiographic evidence of ileus or obstruction.
2. Bibasilar atelectasis and possible pneumonia, left greater than
right.

## 2022-04-01 IMAGING — DX DG CHEST 1V PORT
1 series · 1 of 1 positions shown · non-contrast
Comparison: Chest x-ray 06/17/2021.

CLINICAL DATA: 55-year-old male on ECMO.

EXAM:
PORTABLE CHEST 1 VIEW

[chest]
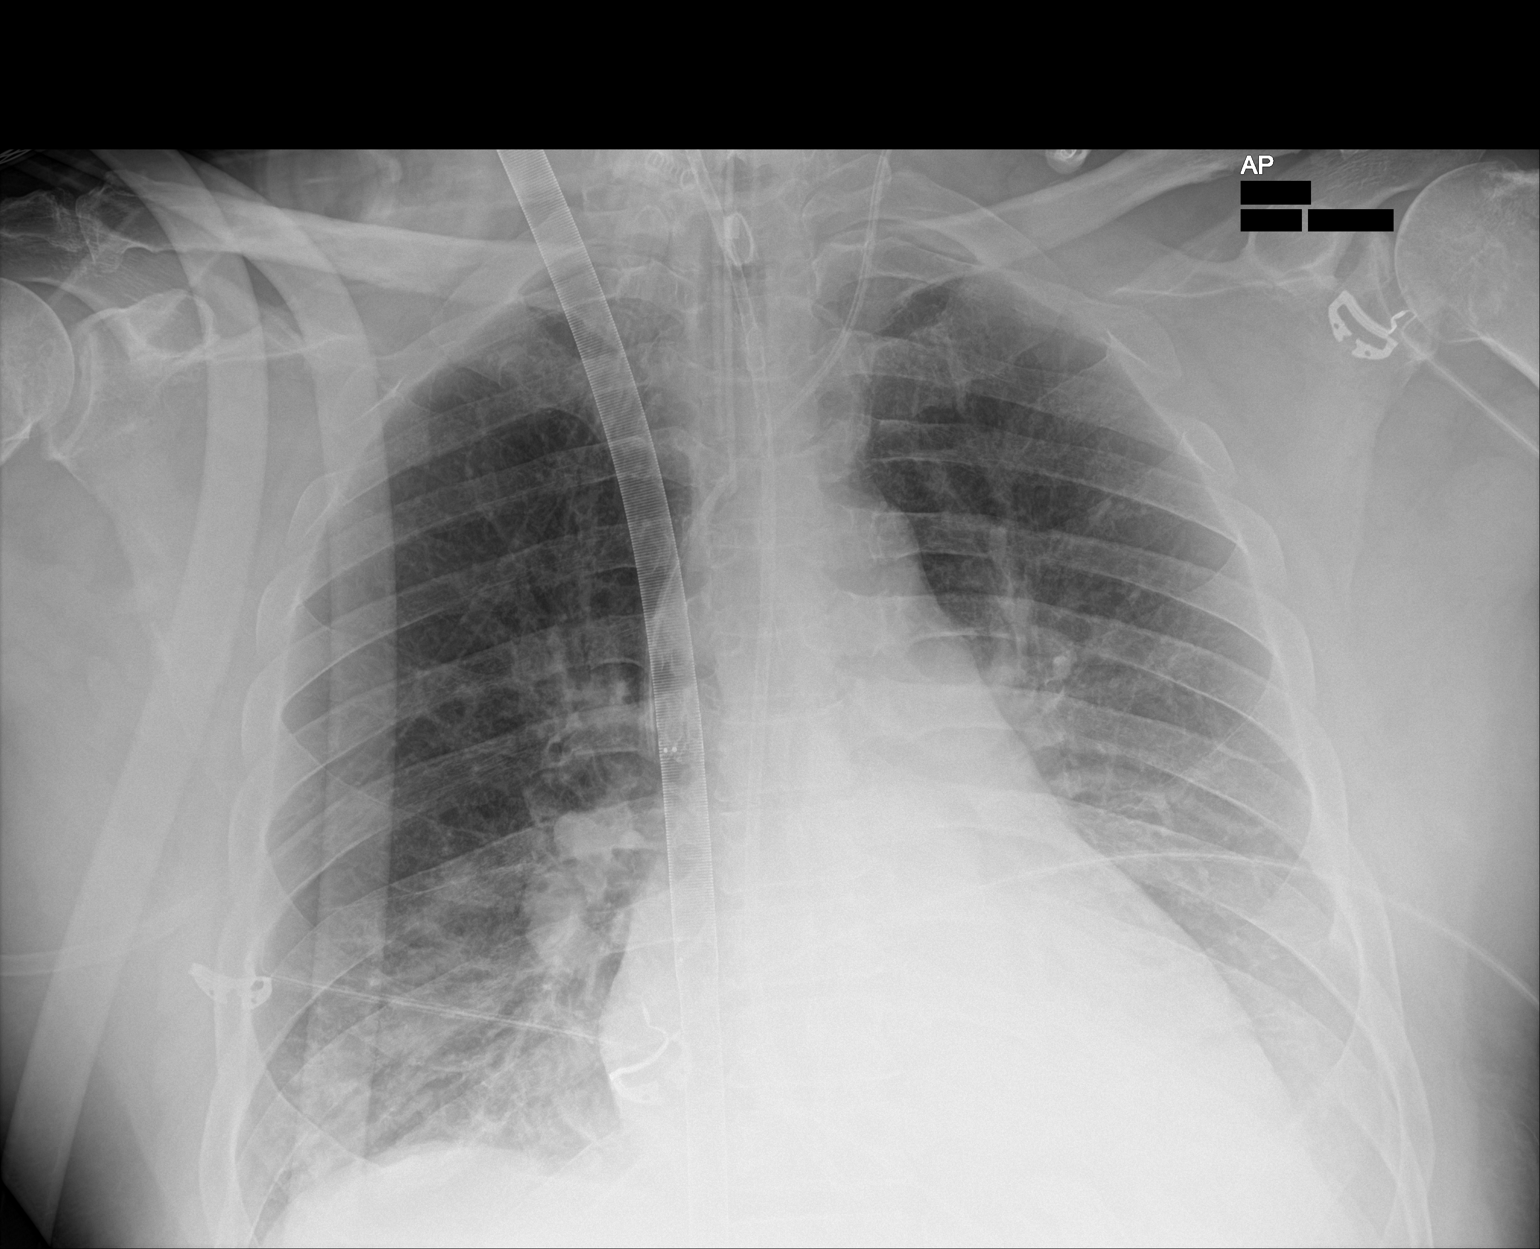

[1 of 1 positions shown; findings below may reference images not displayed]

FINDINGS: An endotracheal tube is in place with tip 5.1 cm above the carina.
ECMO cannula projecting over the mid right hemithorax. There is a
left-sided internal jugular central venous catheter with tip
terminating in the distal superior vena cava. A feeding tube is seen
extending into the abdomen, however, the tip of the feeding tube
extends below the lower margin of the image. Lung volumes appear
normal. Ill-defined bibasilar airspace disease (left greater than
right). Small left pleural effusion. No definite right pleural
effusion. No pneumothorax. No evidence of pulmonary edema. Heart
size is normal. Upper mediastinal contours are within normal limits.
IMPRESSION: 1. Support apparatus, as above.
2. Bibasilar airspace disease, concerning for pneumonia or sequela
of aspiration.
3. Small left pleural effusion.

## 2022-04-02 IMAGING — DX DG ABD PORTABLE 1V
1 series · 1 of 1 positions shown · non-contrast
Comparison: Abdominal x-ray dated June 17, 2021.

CLINICAL DATA: Feeding tube placement.

EXAM:
PORTABLE ABDOMEN - 1 VIEW

[abdomen kub]
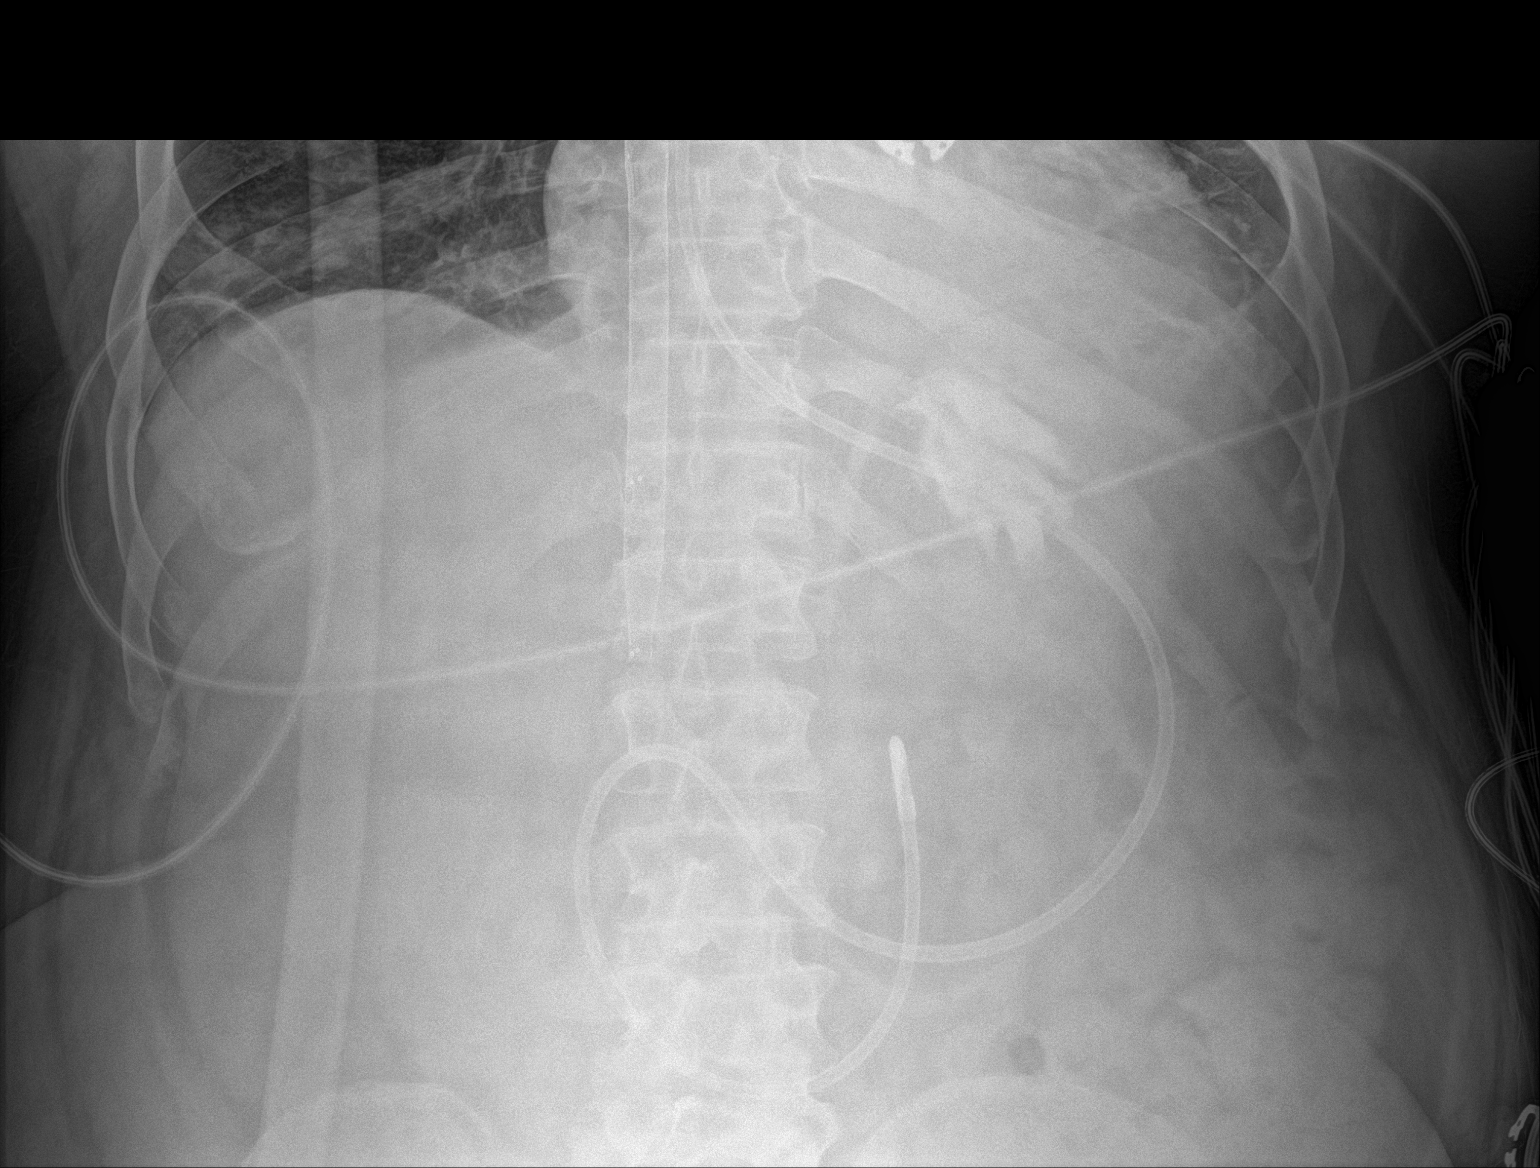

[1 of 1 positions shown; findings below may reference images not displayed]

FINDINGS: Feeding tube tip now in the proximal small bowel near the ligament
of Treitz. The bowel gas pattern is normal. No radio-opaque calculi
or other significant radiographic abnormality are seen. New ECMO
cannula noted with tip in the IVC.
IMPRESSION: 1. Feeding tube tip in the proximal small bowel near the ligament of
Treitz.

## 2022-04-02 IMAGING — DX DG CHEST 1V PORT
1 series · 1 of 1 positions shown · non-contrast
Comparison: Chest x-ray from same day at 9400 hours.

CLINICAL DATA: Endotracheal tube adjustment.  On ECMO.

EXAM:
PORTABLE CHEST 1 VIEW

[chest]
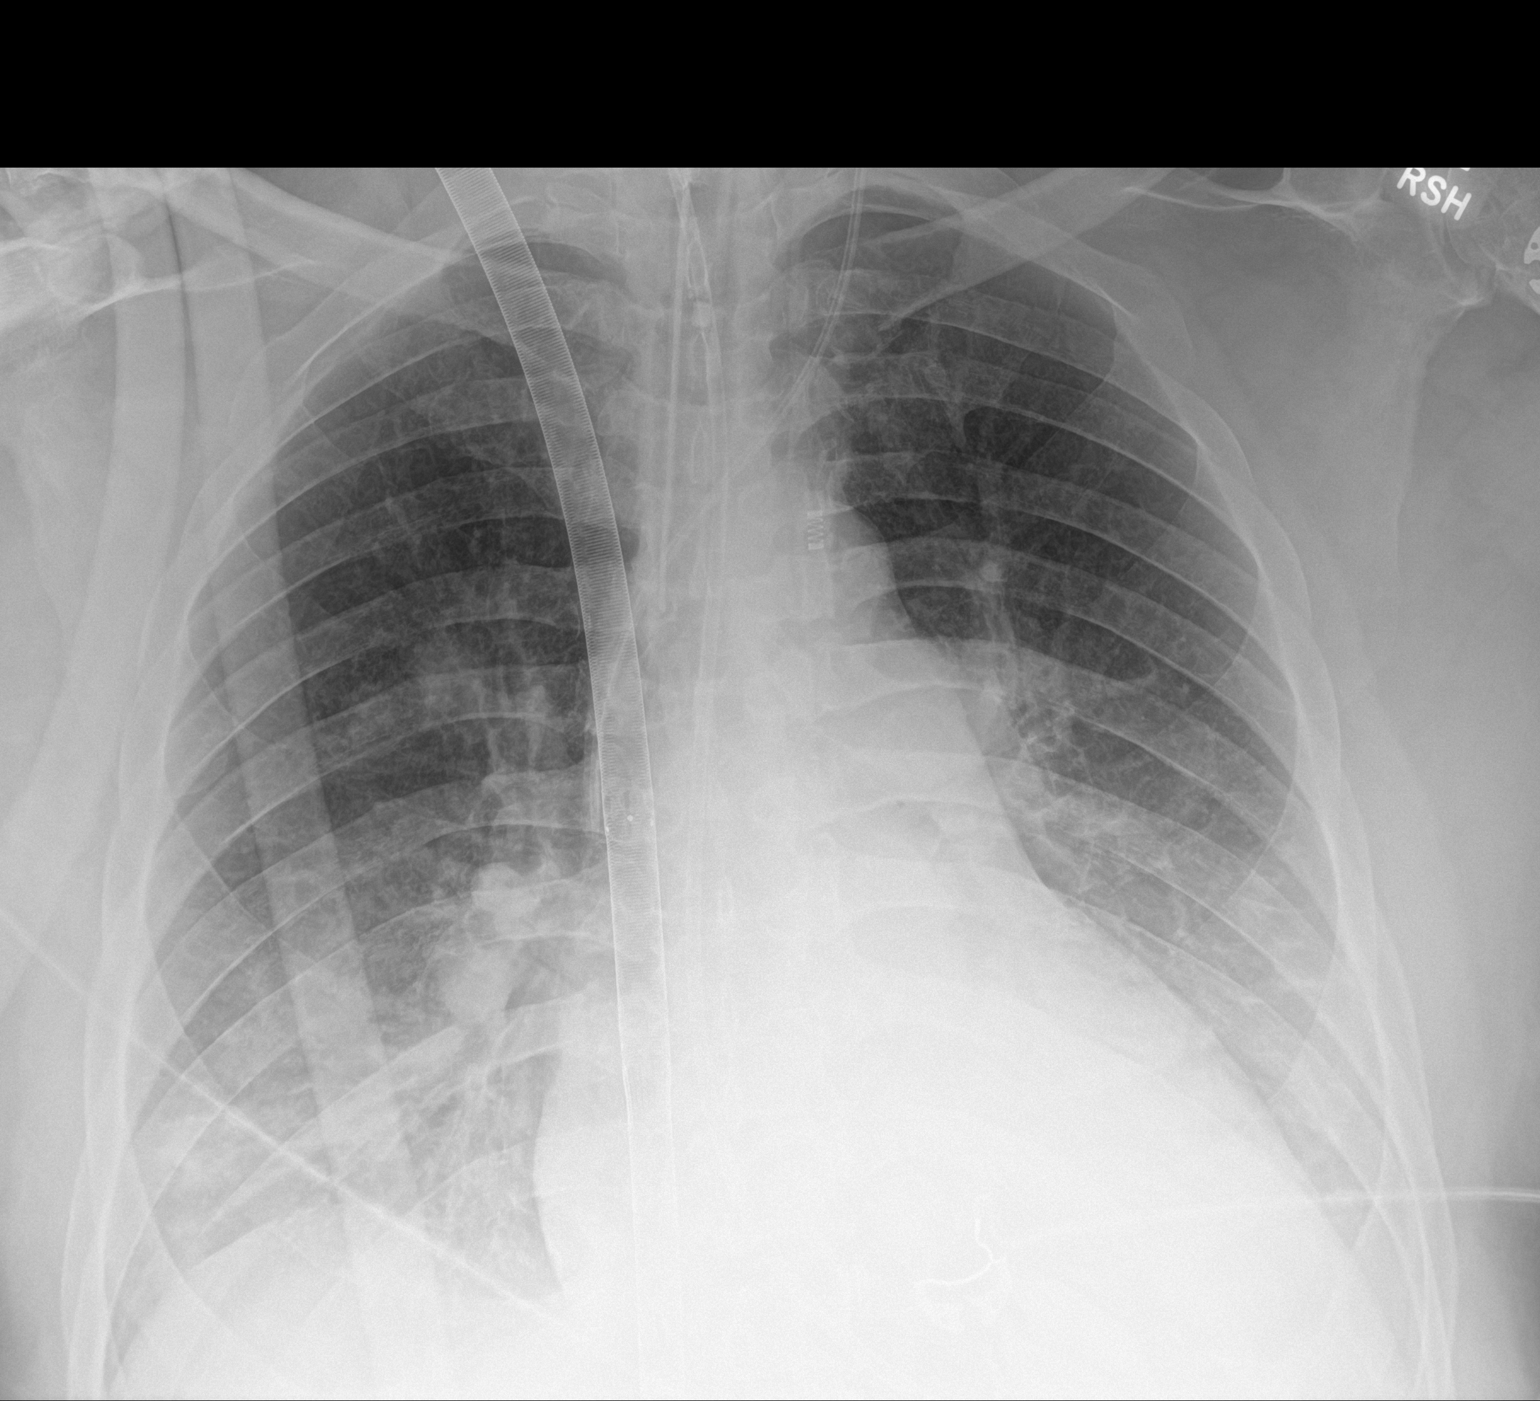

[1 of 1 positions shown; findings below may reference images not displayed]

FINDINGS: Endotracheal tube tip approximately 2.8 cm above the carina.
Unchanged feeding tube, ECMO cannula, and left internal jugular
central venous catheter. Stable cardiomediastinal silhouette and
hazy opacity at both lung bases, likely a combination of layering
pleural effusions and atelectasis. No pneumothorax. No acute osseous
abnormality.
IMPRESSION: 1. Endotracheal tube tip 2.8 cm above the carina. Otherwise stable
lines and tubes.
2. Stable layering bilateral pleural effusions and bibasilar
atelectasis.

## 2022-04-02 IMAGING — DX DG CHEST 1V PORT
1 series · 1 of 1 positions shown · non-contrast
Comparison: Chest x-ray 06/18/2021

CLINICAL DATA: Tube placement.

EXAM:
PORTABLE CHEST 1 VIEW

[chest]
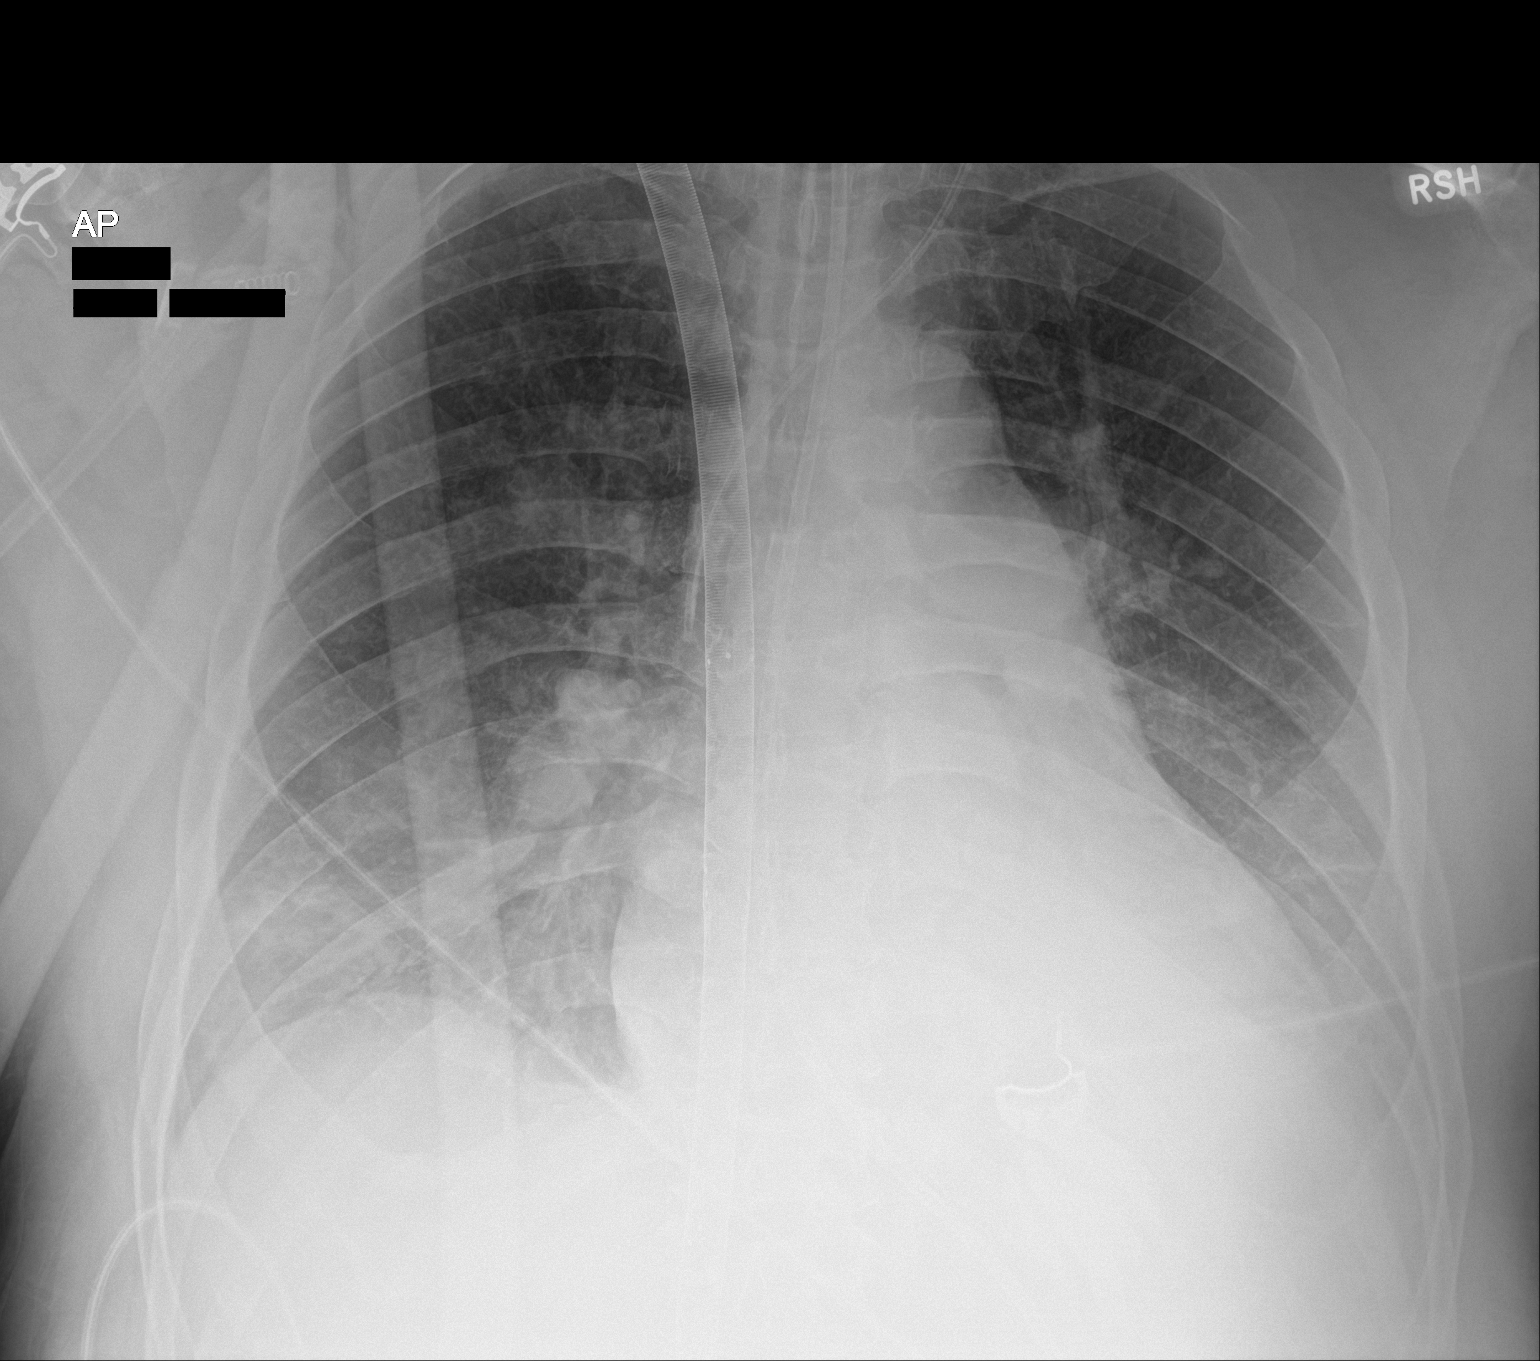

[1 of 1 positions shown; findings below may reference images not displayed]

FINDINGS: Endotracheal tube tip is 3.3 cm above the carina. ECMO cannula
unchanged. Left internal jugular line tip in SVC. Enteric tube tip
below the diaphragm.

Heart size within normal limits. Mediastinum is stable. Hazy
opacities in the lower lung zones similar to previous study. No new
consolidation visualized. No pneumothorax.
IMPRESSION: No significant change since previous study. Medical devices as
described.

## 2022-04-03 IMAGING — DX DG CHEST 1V PORT
1 series · 1 of 1 positions shown · non-contrast
Comparison: Chest x-ray 06/19/2021

CLINICAL DATA: Tube placement

EXAM:
PORTABLE CHEST 1 VIEW

[chest]
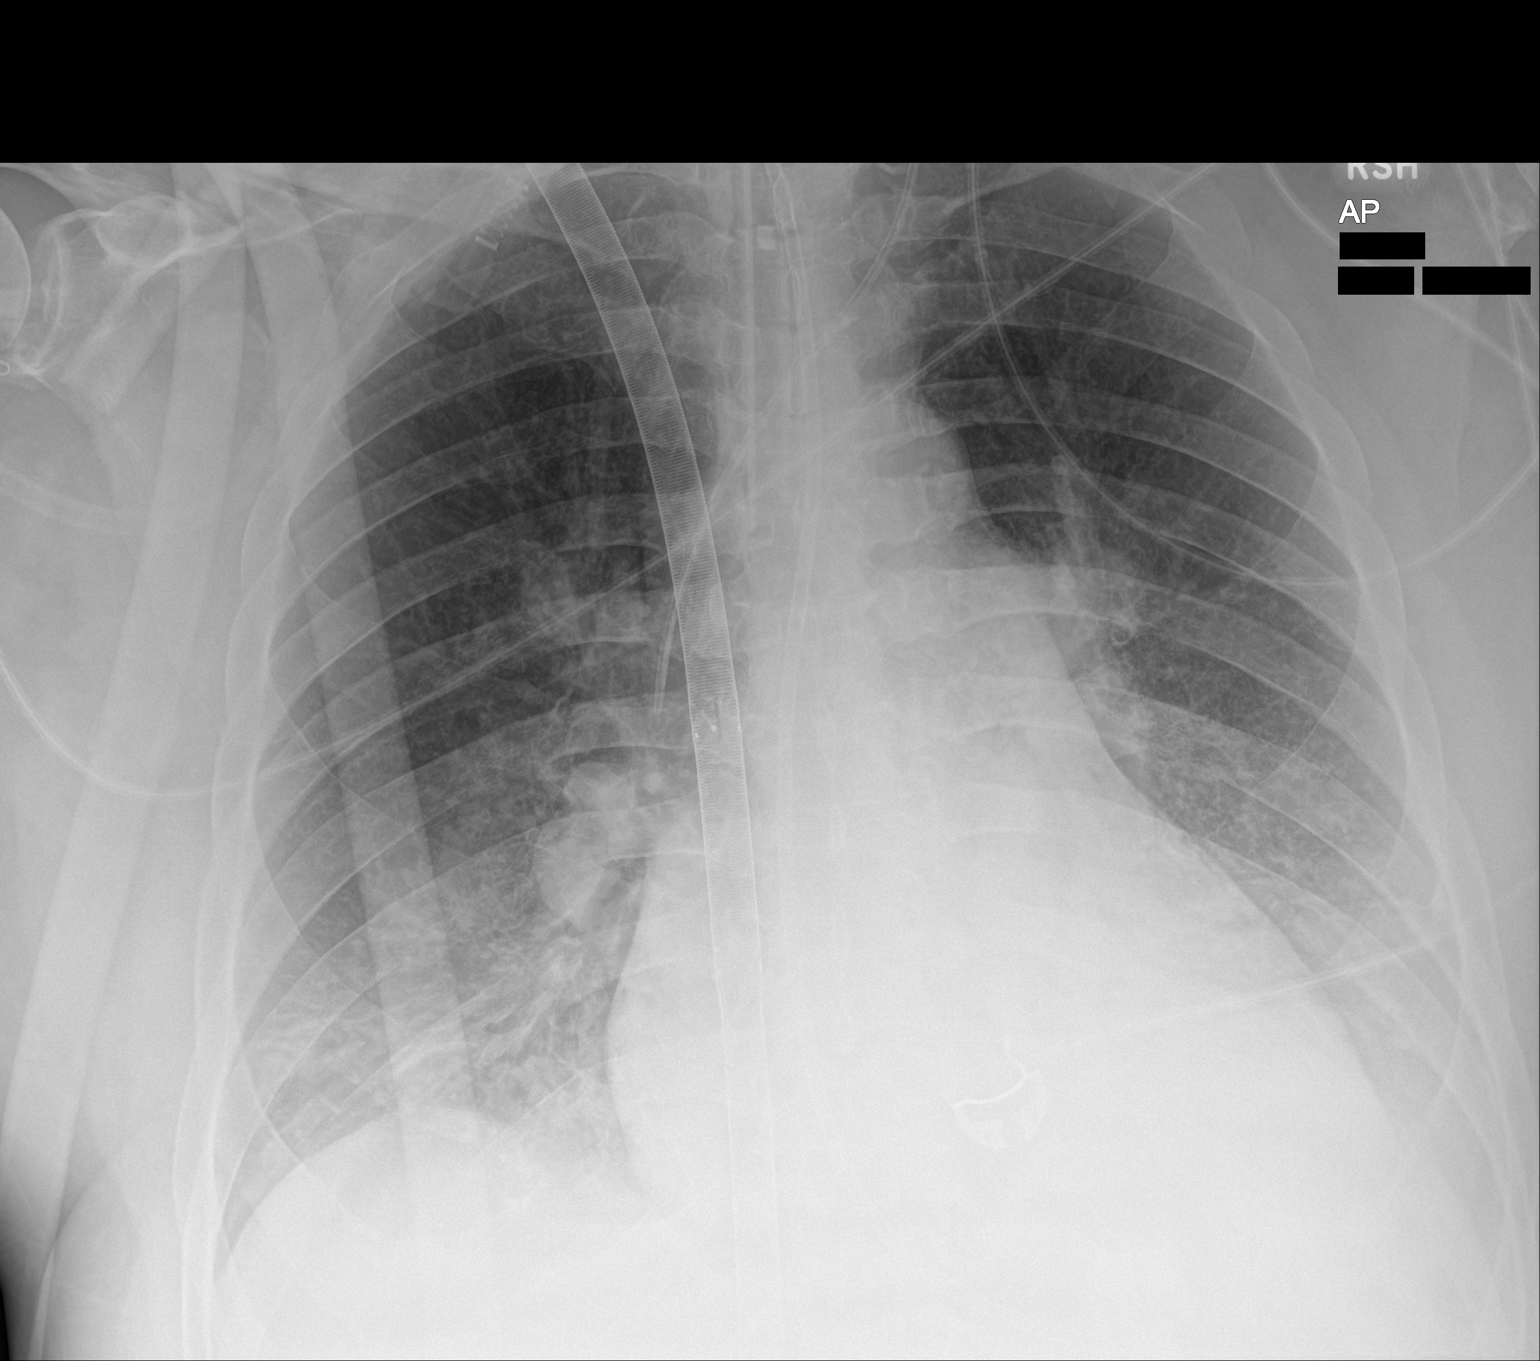

[1 of 1 positions shown; findings below may reference images not displayed]

FINDINGS: Endotracheal tube tip is approximately 1.7 cm above the carina.
Enteric tube tip is below the diaphragm. Left internal jugular line
tip in the SVC. ECMO cannula in the right.

Cardiomediastinal silhouette is unchanged. Persistent granular and
hazy opacities in the lower lung zones with likely small layering
pleural effusions. No pneumothorax.
IMPRESSION: 1. Medical devices as described.
2. No significant change since previous study.

## 2022-04-04 IMAGING — DX DG CHEST 1V PORT
1 series · 1 of 1 positions shown · non-contrast
Comparison: Prior chest x-ray 06/20/2021

CLINICAL DATA: Tracheostomy

EXAM:
PORTABLE CHEST 1 VIEW

[chest]
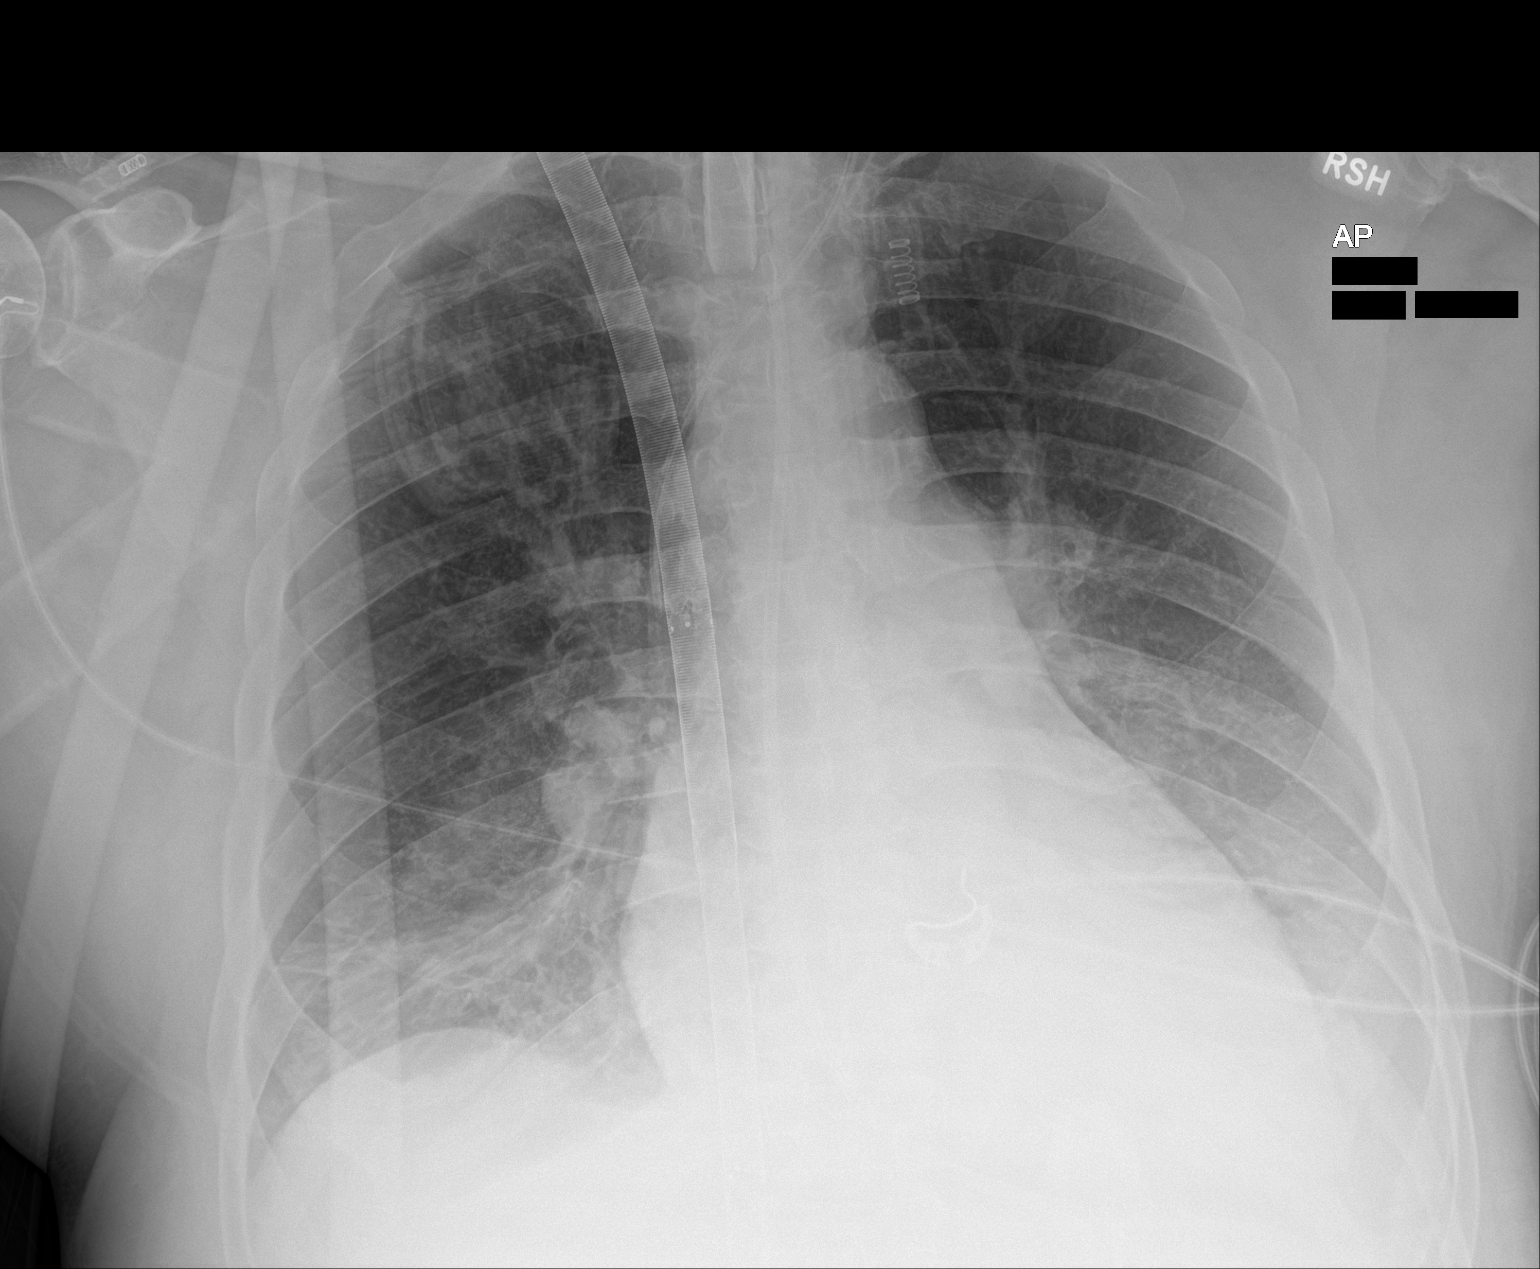

[1 of 1 positions shown; findings below may reference images not displayed]

FINDINGS: Interval placement of tracheostomy tube. Tube tip is midline and at
the level of the clavicles in good position. Feeding tube remains
present. The tip lies off the field of view, presumably in the small
bowel. Left IJ approach central venous catheter. Tip overlies the
mid SVC. ECMO catheter via right internal jugular vein. Stable
cardiomegaly. Probable left layering pleural effusion. Bibasilar
atelectasis. No pneumothorax. No pulmonary edema.
IMPRESSION: Interval removal of endotracheal tube and placement of a
tracheostomy tube.

Otherwise, stable chest x-ray.

## 2022-04-05 IMAGING — DX DG CHEST 1V PORT
1 series · 1 of 1 positions shown · non-contrast
Comparison: Yesterday

CLINICAL DATA: ECMO

EXAM:
PORTABLE CHEST 1 VIEW

[chest]
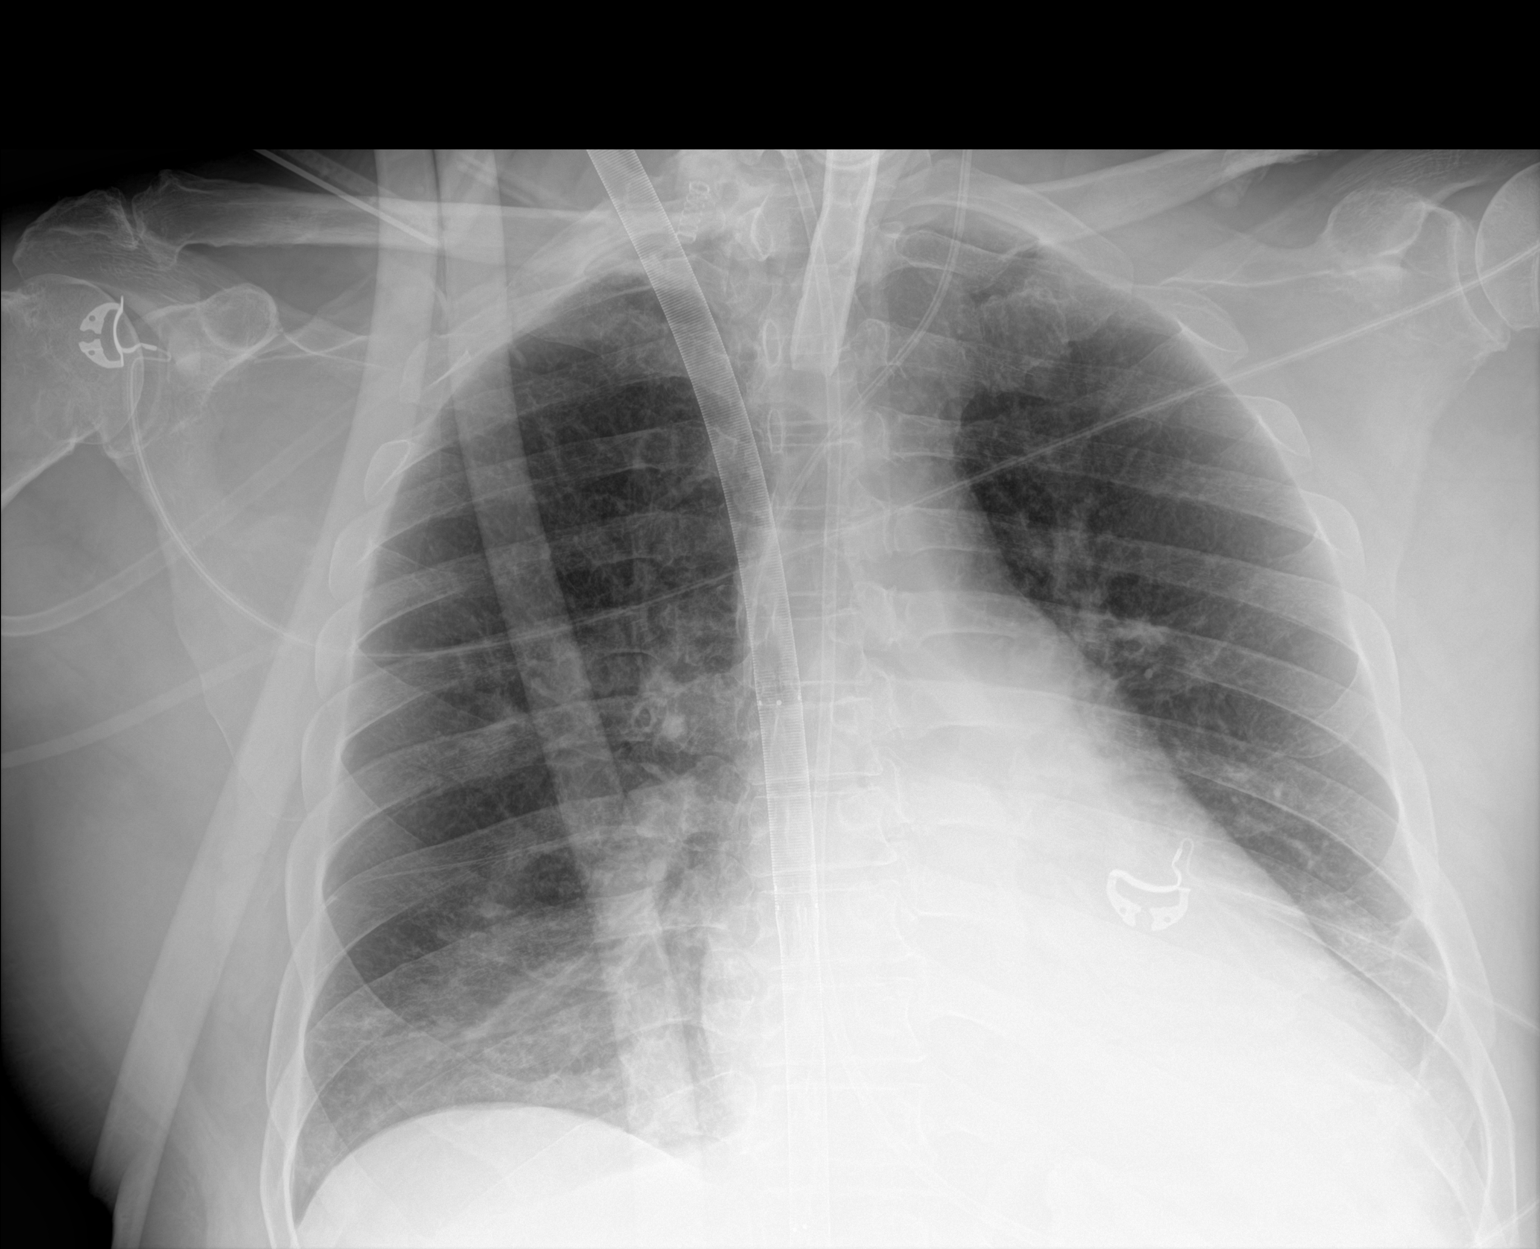

[1 of 1 positions shown; findings below may reference images not displayed]

FINDINGS: Stable cardiomegaly and hazy opacity at the bases which is likely
both airspace opacity and pleural fluid. ECMO cannula and left IJ
line in stable position. Stable tracheostomy tube and feeding tube
positioning. No pneumothorax.

Stable hardware and lower chest opacification.
IMPRESSION: Stable hardware and lower chest opacification.

## 2022-04-06 IMAGING — DX DG ABDOMEN 1V
1 series · 2 of 2 positions shown · non-contrast
Comparison: Radiograph 06/19/2021, 06/17/2021

CLINICAL DATA: Ileus evaluation.

EXAM:
ABDOMEN - 1 VIEW

[Series 1: abdomen · 0.14mm/px · 2 of 2 slices shown]
[im 1/2]
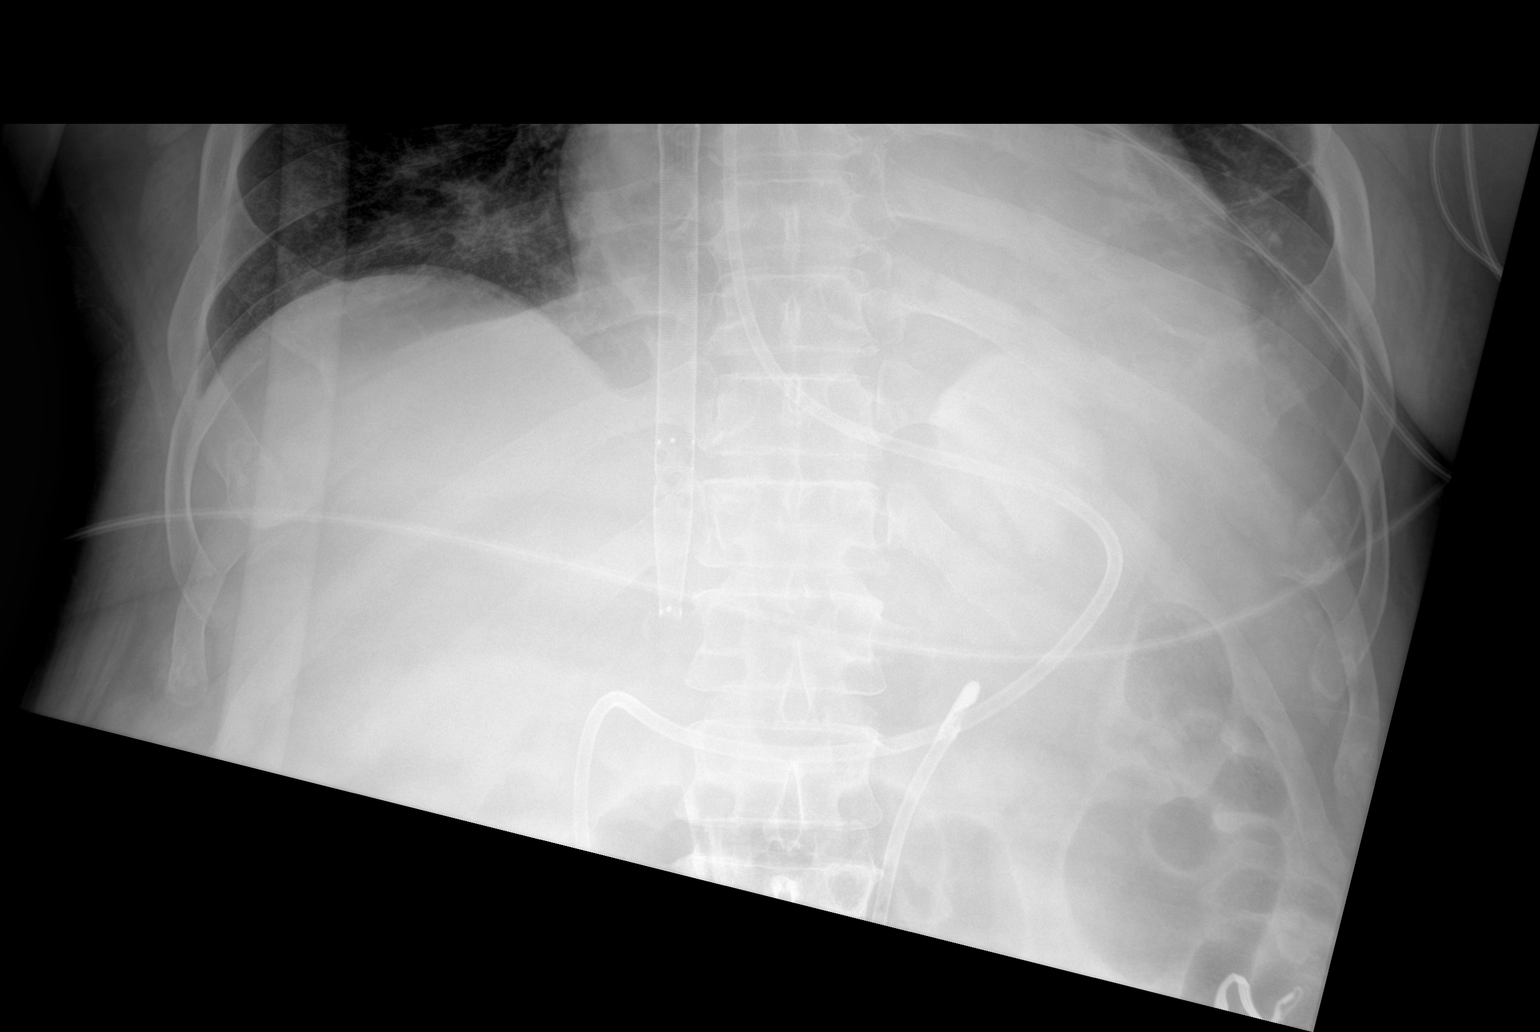
[im 2/2]
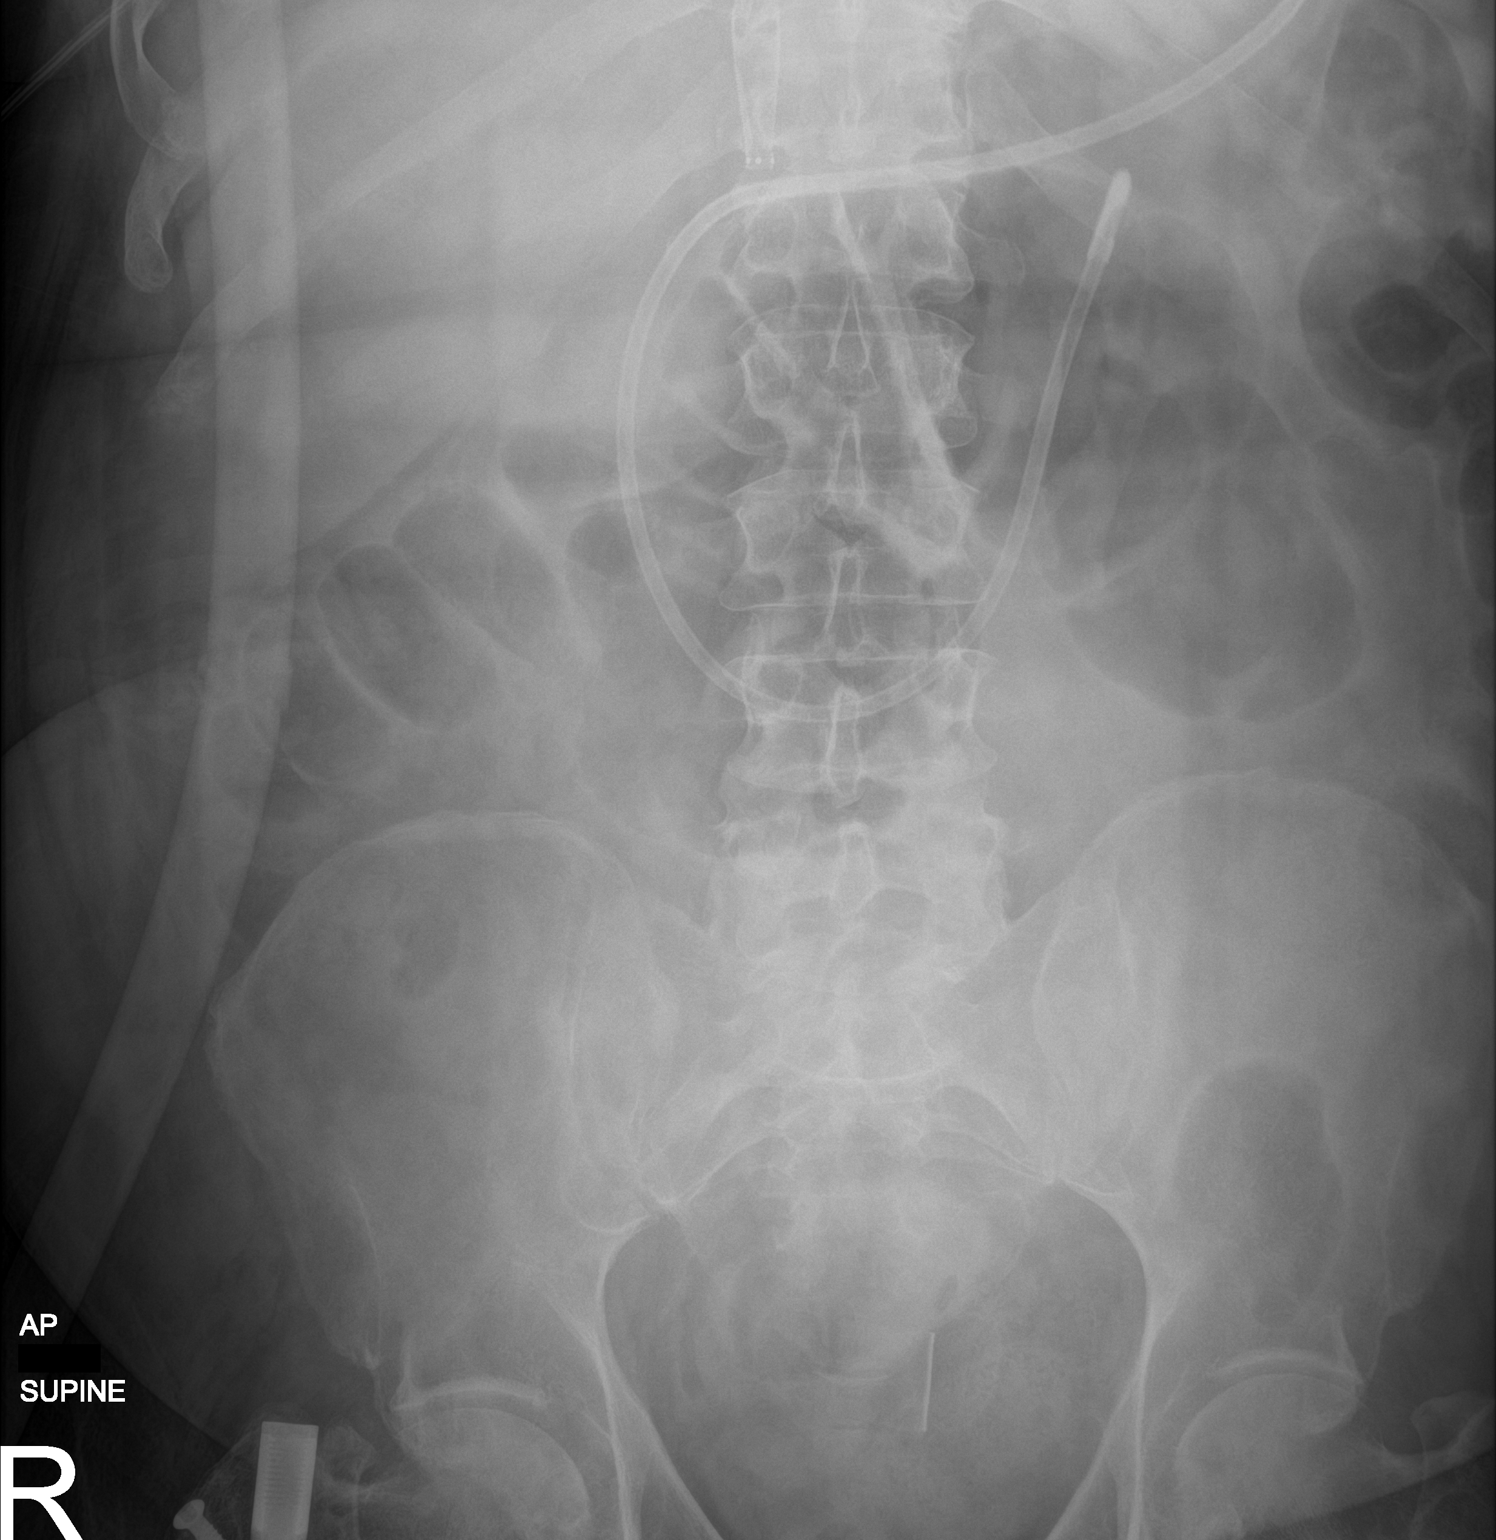

[2 of 2 positions shown; findings below may reference images not displayed]

FINDINGS: Weighted enteric tube tip in the proximal jejunum. Large-bore
catheter in the chest extends below the diaphragm in the region of
the IVC. No definite gaseous small bowel distension. There is air
within mildly prominent transverse and descending colon. Small
volume of stool in the rectum. Linear 2.2 cm density projecting over
the pelvis just to the left of midline is of unknown significance
and etiology. No evidence of free air.
IMPRESSION: 1. Nonspecific bowel gas pattern with mildly prominent air within
transverse and descending colon but no definite gaseous small bowel
distension to suggest small bowel ileus.
2. A 2.2 cm linear density projecting over the pelvis just to the
left of midline is of unknown significance and etiology. This may be
external to the patient or related to a catheter. Recommend
correlation with physical exam.

## 2022-04-07 IMAGING — DX DG CHEST 1V PORT
1 series · 1 of 1 positions shown · non-contrast
Comparison: 06/23/2021

CLINICAL DATA: ECMO, tracheostomy

EXAM:
PORTABLE CHEST 1 VIEW

[chest]
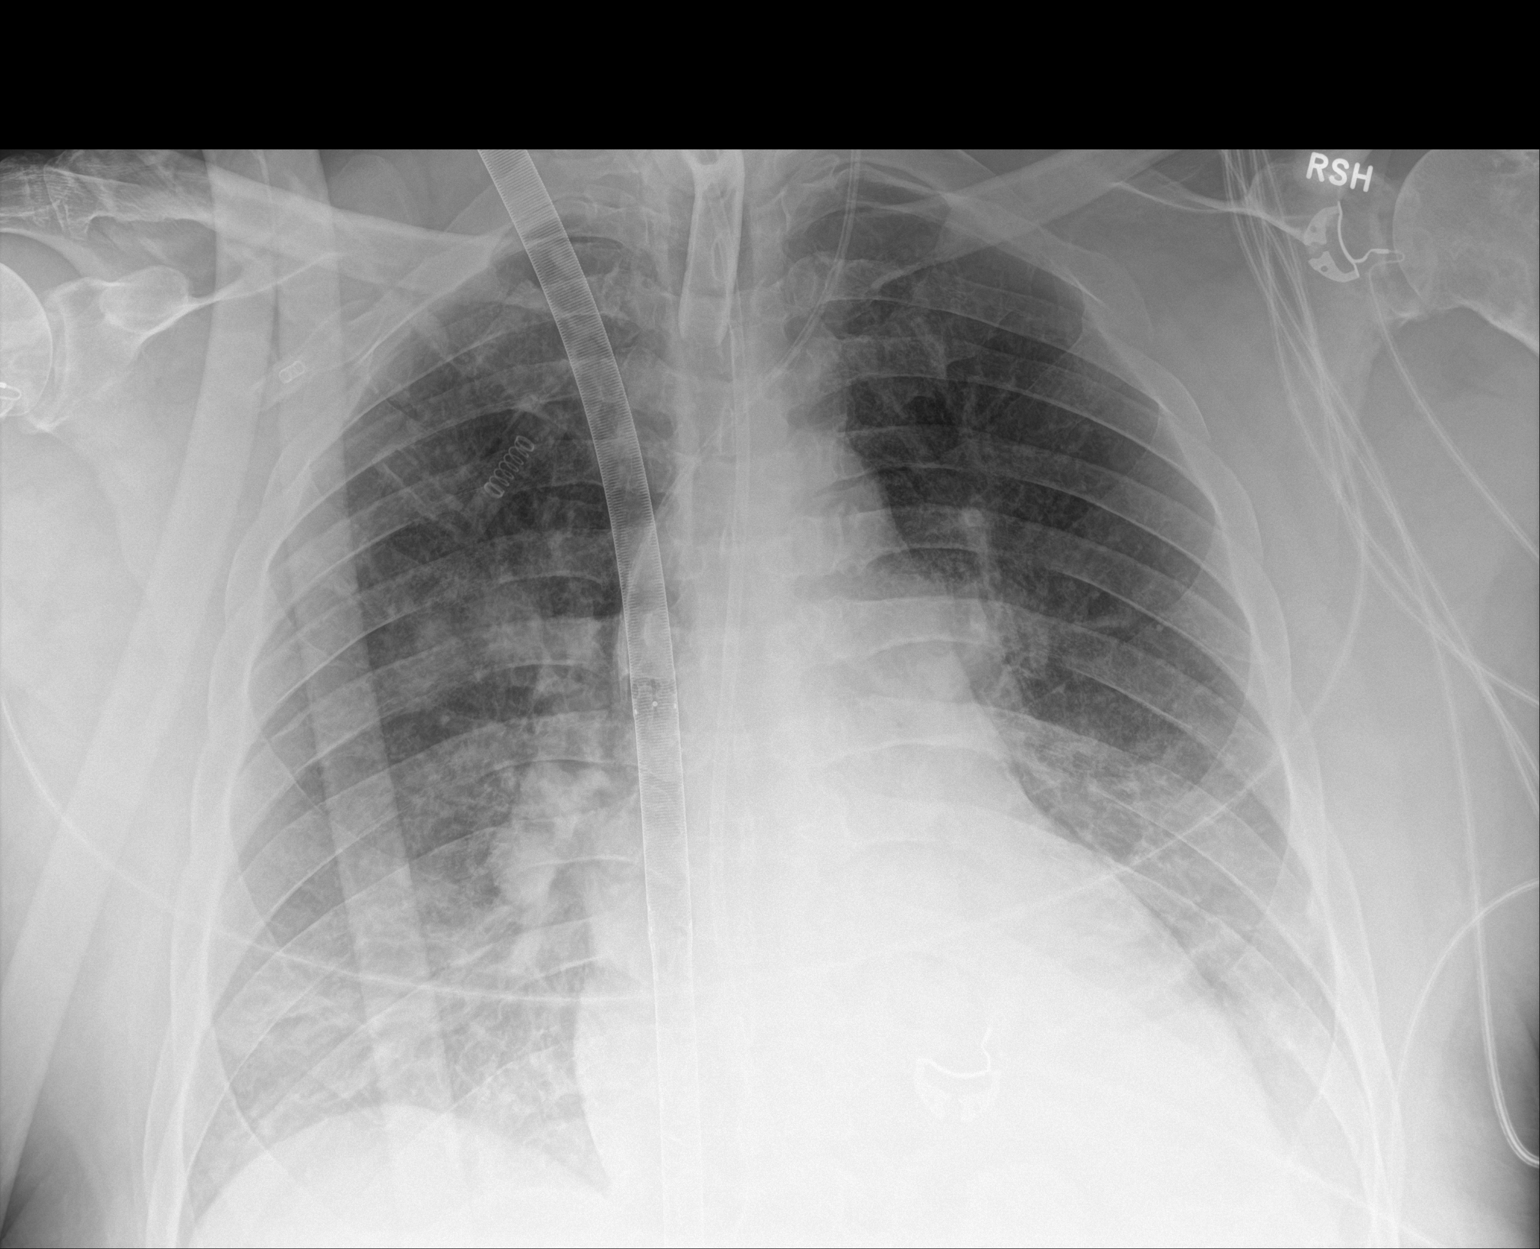

[1 of 1 positions shown; findings below may reference images not displayed]

FINDINGS: No significant change in AP portable examination which excludes the
most inferior lung bases. Small, layering bilateral pleural
effusions. Cardiomegaly. Unchanged support apparatus including
venovenous ECMO cannula, tracheostomy, left neck vascular catheter,
and partially imaged enteric feeding tube.
IMPRESSION: 1. No significant change in AP portable examination which excludes
the most inferior lung bases. Small, layering bilateral pleural
effusions. No new airspace opacity.
2. Unchanged support apparatus.

## 2022-04-08 IMAGING — DX DG CHEST 1V PORT
1 series · 1 of 1 positions shown · non-contrast
Comparison: Chest radiograph 1 day prior

CLINICAL DATA: Tracheostomy tube and ECMO present

EXAM:
PORTABLE CHEST 1 VIEW

[chest]
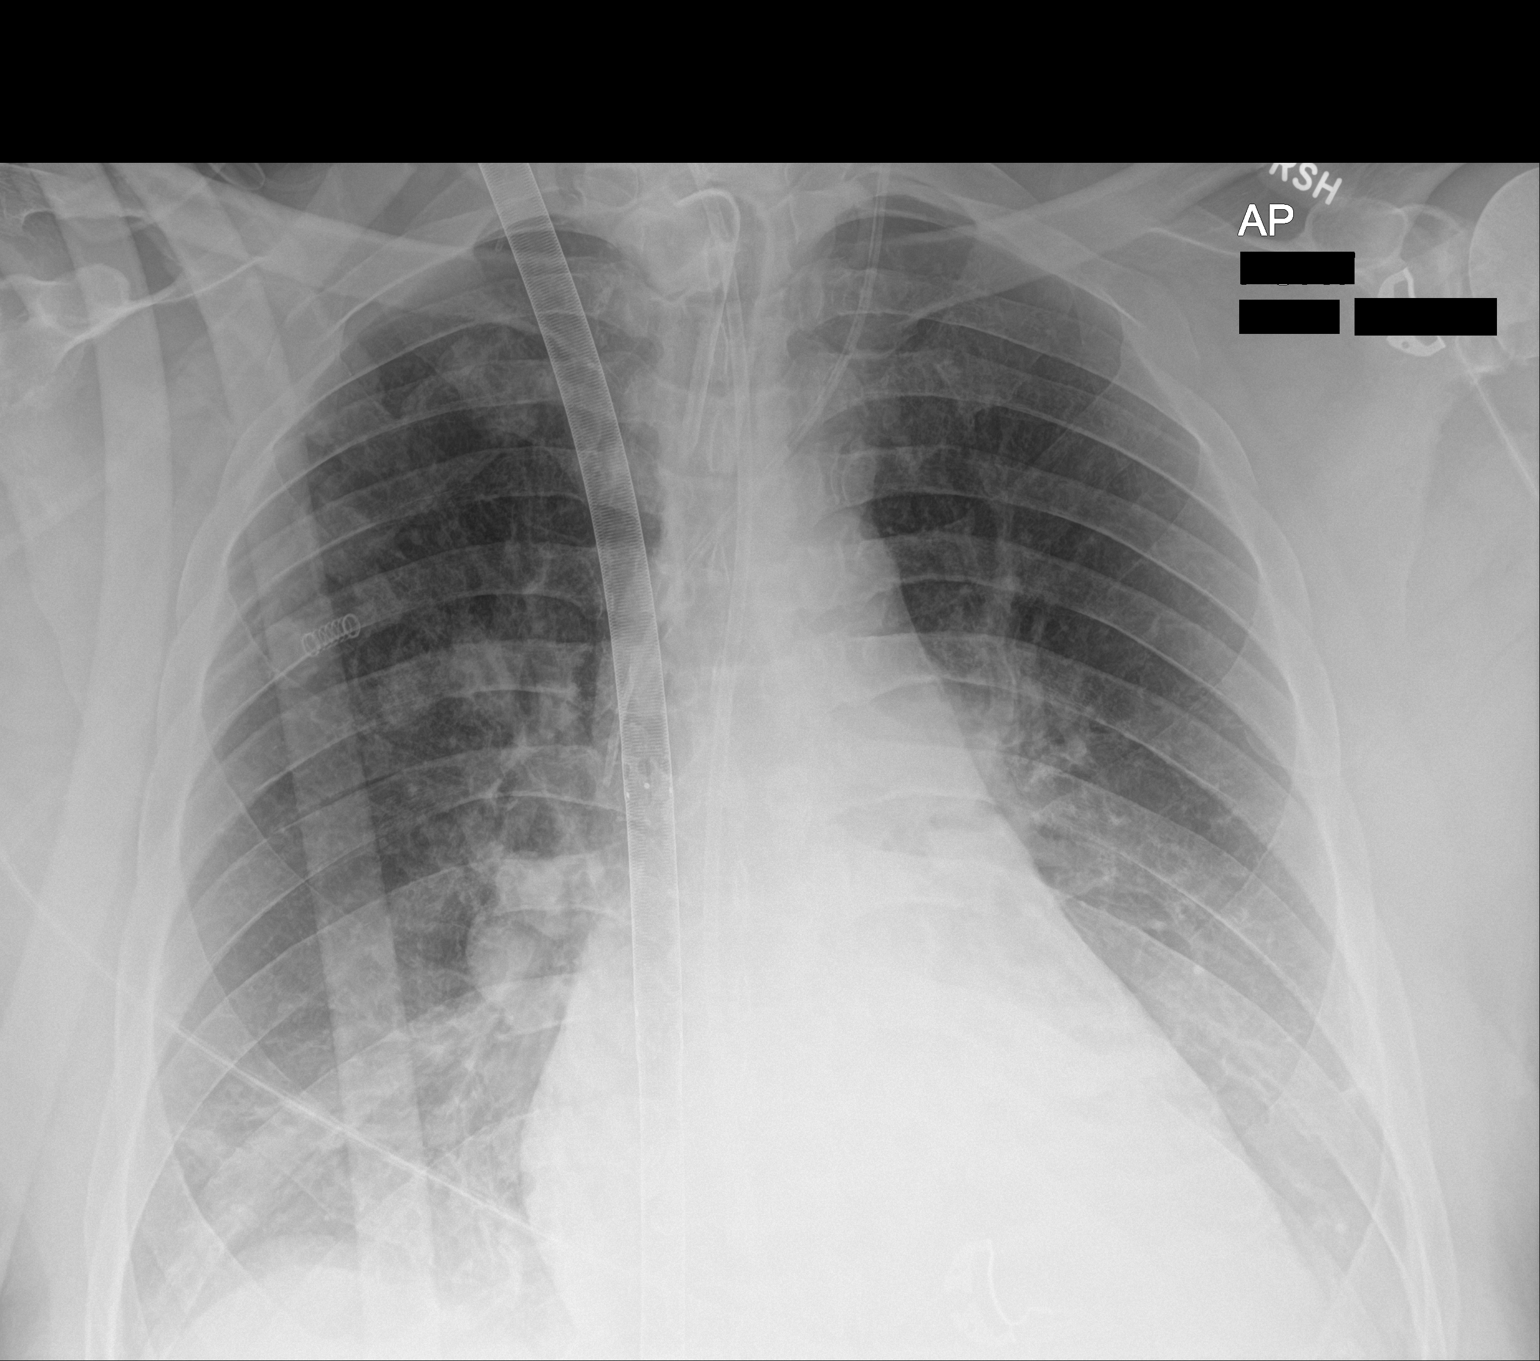

[1 of 1 positions shown; findings below may reference images not displayed]

FINDINGS: The tracheostomy tube is in stable position. A left IJ central
venous catheter is in stable position terminating in the mid SVC.
The enteric catheter tip is off the field of view. A large-bore ECMO
cannula courses off the field of view but the imaged portions are
stable

The cardiomediastinal silhouette is stable, with unchanged
cardiomegaly. The costophrenic angles are not included within the
field of view. There is no new focal consolidation in the imaged
lungs. There is no large pleural effusion. There is no appreciable
pneumothorax.

The imaged bones are stable.
IMPRESSION: 1. Unchanged support devices as above.
2. No significant interval change in lung aeration.

## 2022-04-09 IMAGING — DX DG ABDOMEN 1V
1 series · 1 of 1 positions shown · non-contrast
Comparison: 06/23/2021.

CLINICAL DATA: 55-year-old male with abdominal distension. On ECMO.

EXAM:
ABDOMEN - 1 VIEW

[abdomen]
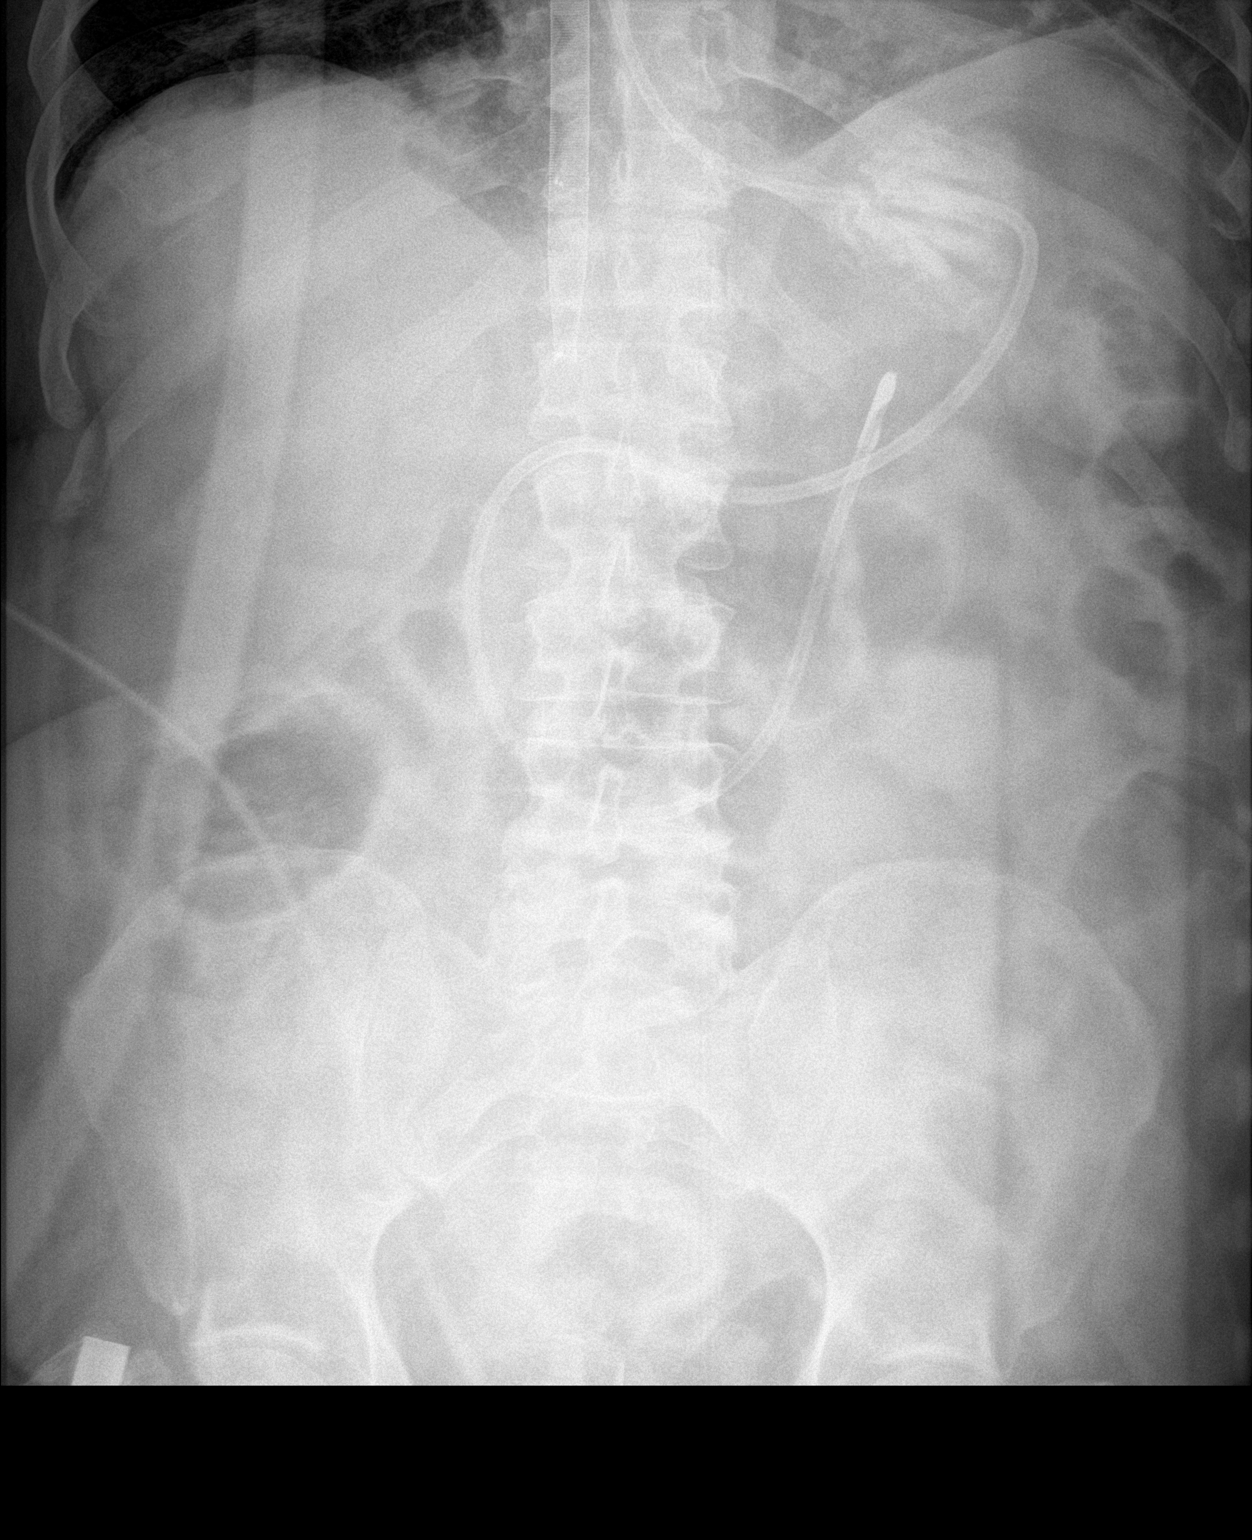

[1 of 1 positions shown; findings below may reference images not displayed]

FINDINGS: Portable AP supine view at 1916 hours. Inferior extent of ECMO
cannula visible at the level of the hepatic IVC. Enteric feeding
tube courses through the stomach and to the ligament of Treitz as
before. There is a small volume of oral contrast at the gastric
fundus. Visible bowel gas pattern is within normal limits. Partially
visible previous right femur ORIF. No acute osseous abnormality
identified. 2 cm linear object again projects over the midline lower
pelvis.
IMPRESSION: 1. Normal gas pattern. Stable and satisfactory enteric feeding tube.
2. Partially visible ECMO cannula. 2 cm linear foreign body again
projects over the midline lower pelvis, unclear significance.

## 2022-04-09 IMAGING — DX DG CHEST 1V PORT
1 series · 1 of 1 positions shown · non-contrast
Comparison: Portable chest 06/25/2021 and earlier.

CLINICAL DATA: 55-year-old male on ECMO. Influenza a. COPD with
exacerbation. HIV.

EXAM:
PORTABLE CHEST 1 VIEW

[chest ap]
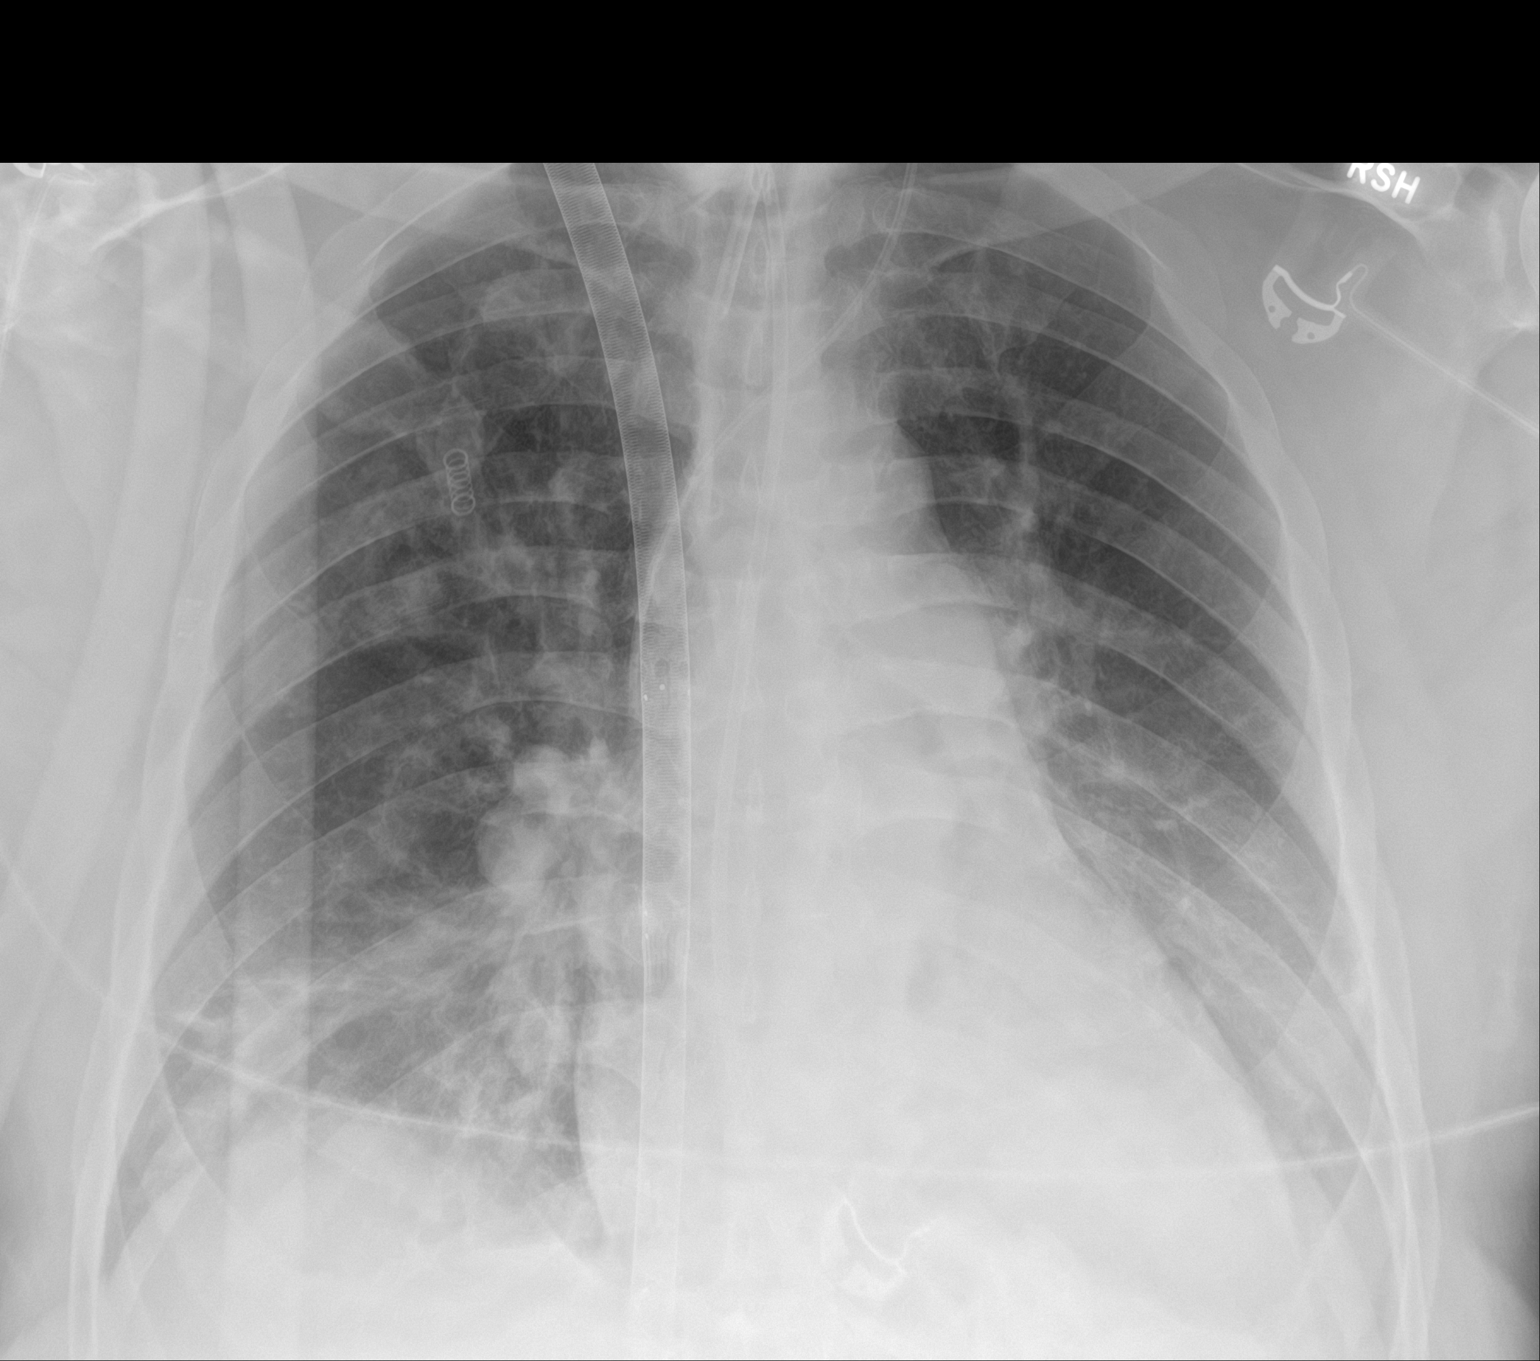

[1 of 1 positions shown; findings below may reference images not displayed]

FINDINGS: Portable AP semi upright view at 3932 hours. Stable tracheostomy.
Stable left IJ approach central line. Stable ECMO cannula. Stable
enteric feeding tube coursing to the abdomen. Stable lung volumes.
Stable mediastinal contours with mild cardiomegaly. Streaky
bilateral lung base opacity. Ventilation has not significantly
changed since [REDACTED]. No pneumothorax or pulmonary edema. No
pleural effusion is evident. Stable visualized osseous structures.
IMPRESSION: 1. Stable lines and tubes.
2. Stable ventilation over this series of exams with streaky
bibasilar opacity.

## 2022-04-10 IMAGING — DX DG CHEST 1V PORT
1 series · 2 of 2 positions shown · non-contrast
Comparison: Chest x-ray dated June 26, 2021

CLINICAL DATA: ECMO soft

EXAM:
PORTABLE CHEST 1 VIEW

[Series 1: chest · 0.14mm/px · 2 of 2 slices shown]
[im 1/2]
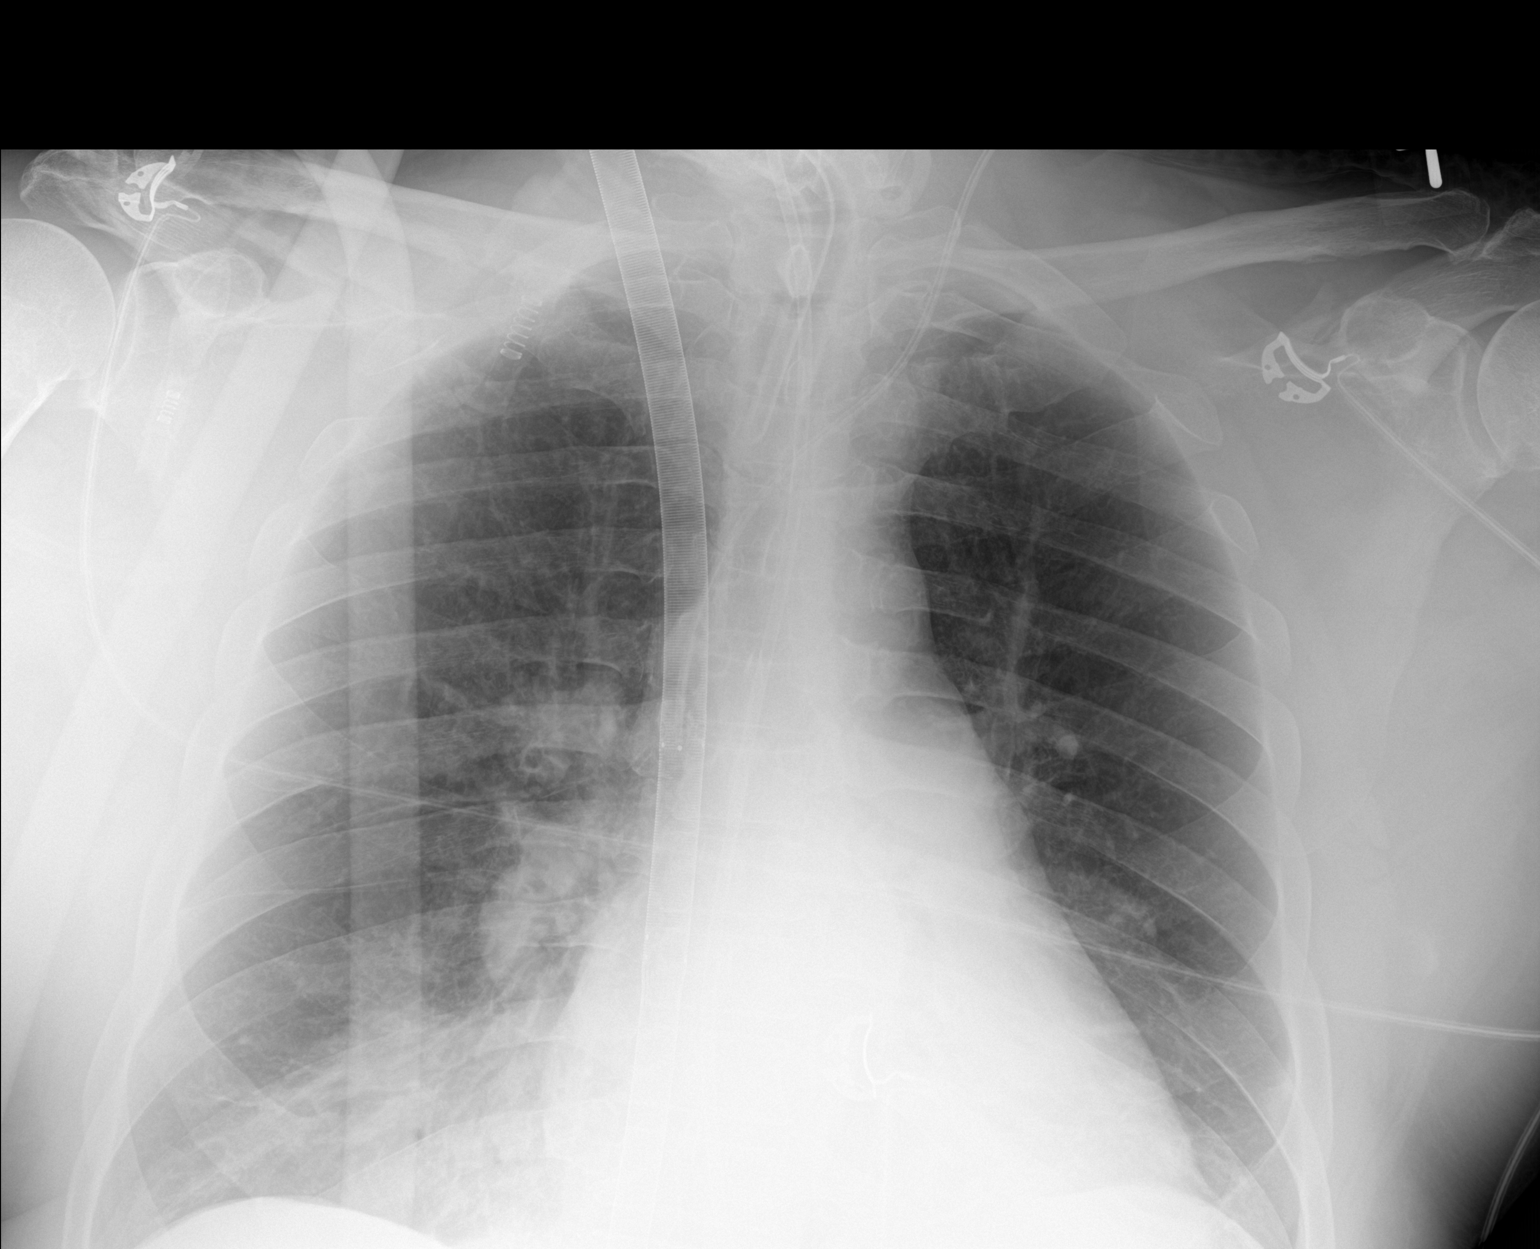
[im 2/2]
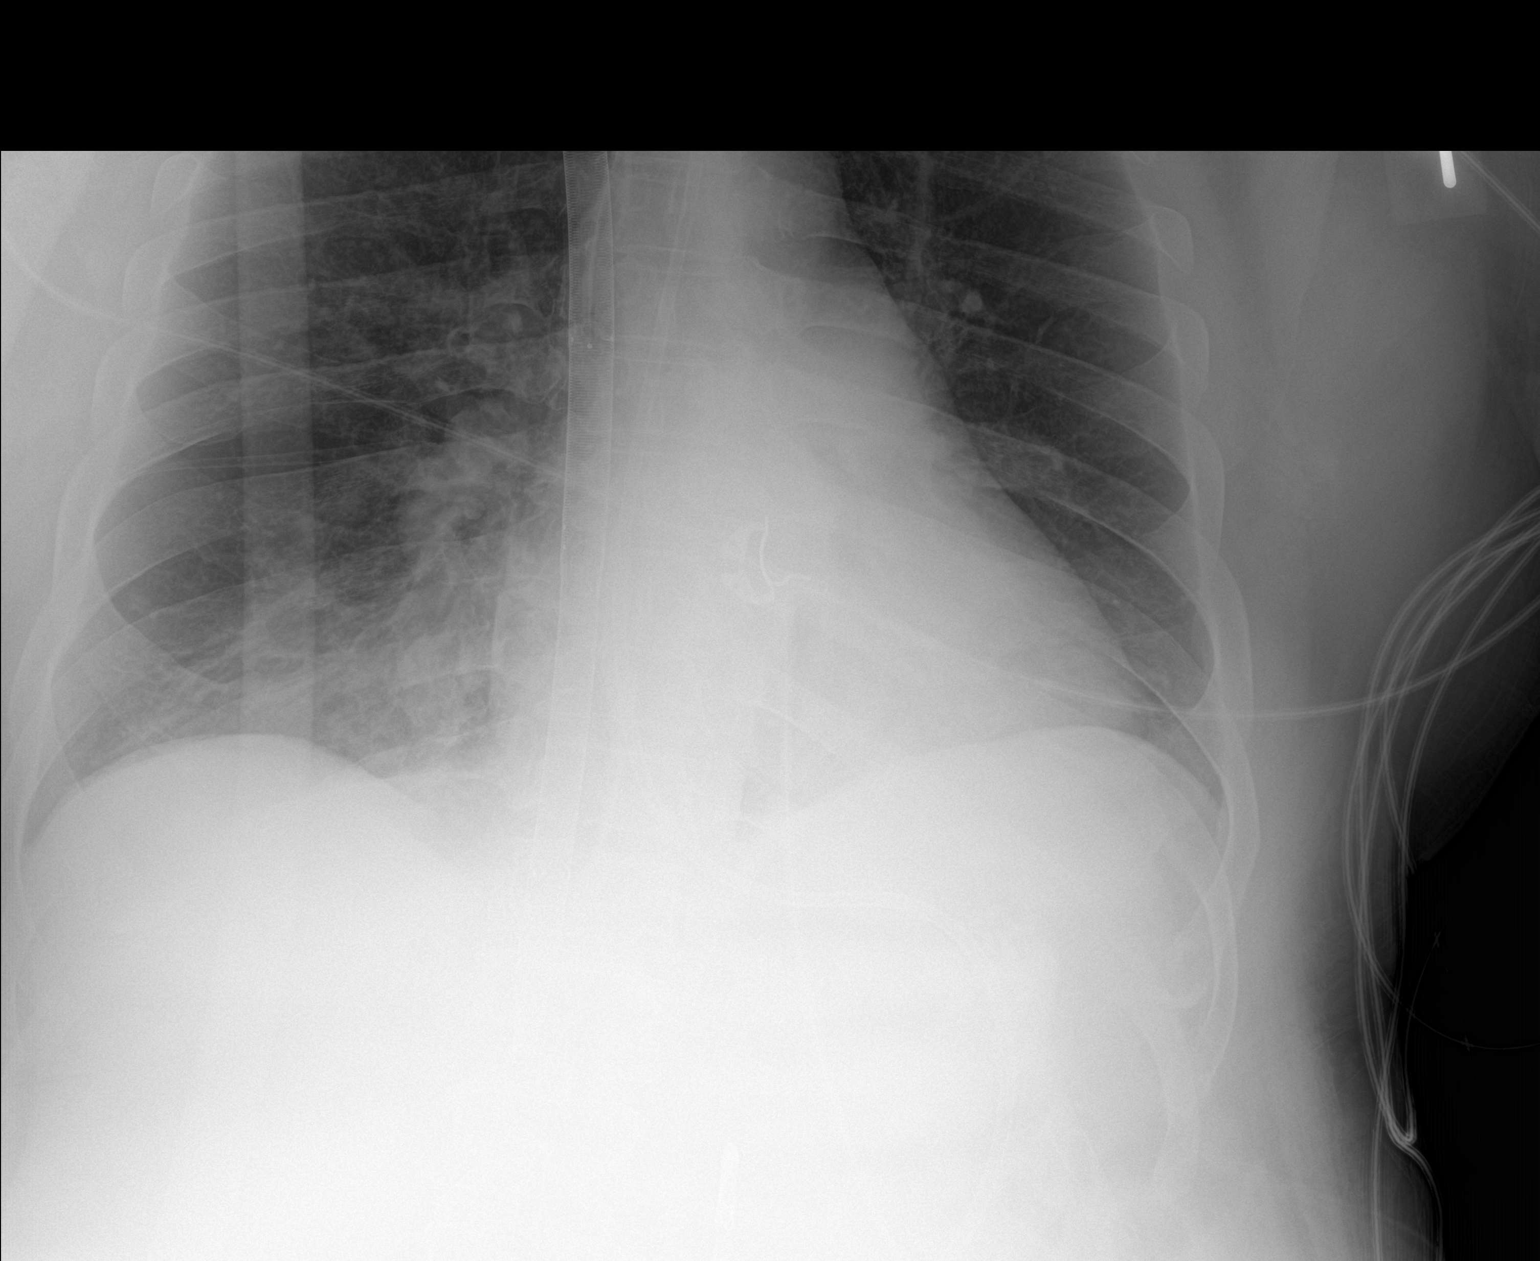

[2 of 2 positions shown; findings below may reference images not displayed]

FINDINGS: ETT, left IJ line, and feeding tube are unchanged position.
Unchanged position of ECMO cannula. Bibasilar opacities, unchanged
compared to prior and likely due to atelectasis. No large pleural
effusion or pneumothorax.
IMPRESSION: Stable support devices. Bibasilar opacities are unchanged compared
to prior likely due to atelectasis.

## 2022-04-11 IMAGING — DX DG CHEST 1V PORT
1 series · 1 of 1 positions shown · non-contrast
Comparison: Radiograph 06/27/2021

CLINICAL DATA: History of ECMO, AFib, CHF

EXAM:
PORTABLE CHEST 1 VIEW

[chest ap]
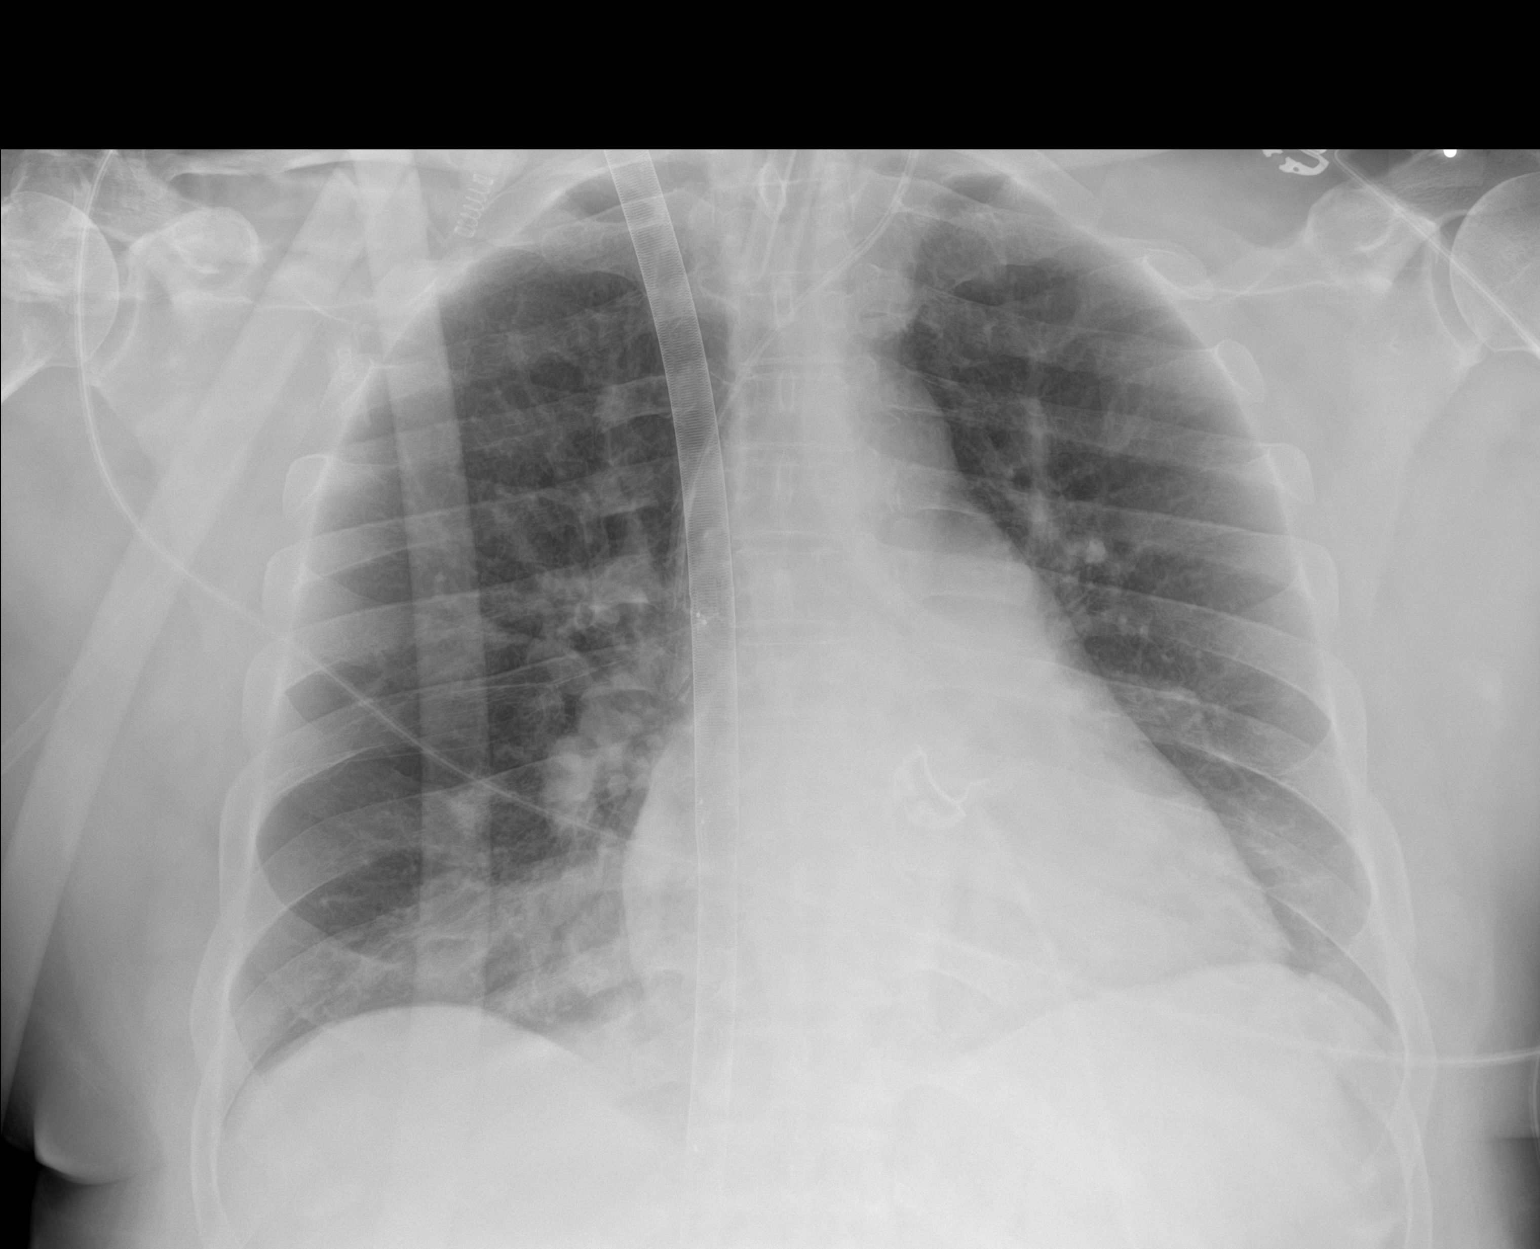

[1 of 1 positions shown; findings below may reference images not displayed]

FINDINGS: Feeding tube has been removed. Tracheostomy tube overlies the
trachea. Central venous catheter tip overlies the distal superior
vena cava. Unchanged ECMO cannula. Unchanged bibasilar airspace
disease. No large pleural effusion. No visible pneumothorax. No
acute osseous abnormality.
IMPRESSION: Feeding tube has been removed. Otherwise stable ECMO cannula,
tracheostomy tube and central venous catheter.

Unchanged bibasilar airspace disease.

## 2022-04-12 IMAGING — DX DG CHEST 1V PORT
1 series · 1 of 1 positions shown · non-contrast
Comparison: Chest radiograph 1 day prior

CLINICAL DATA: ECMO

EXAM:
PORTABLE CHEST 1 VIEW

[chest]
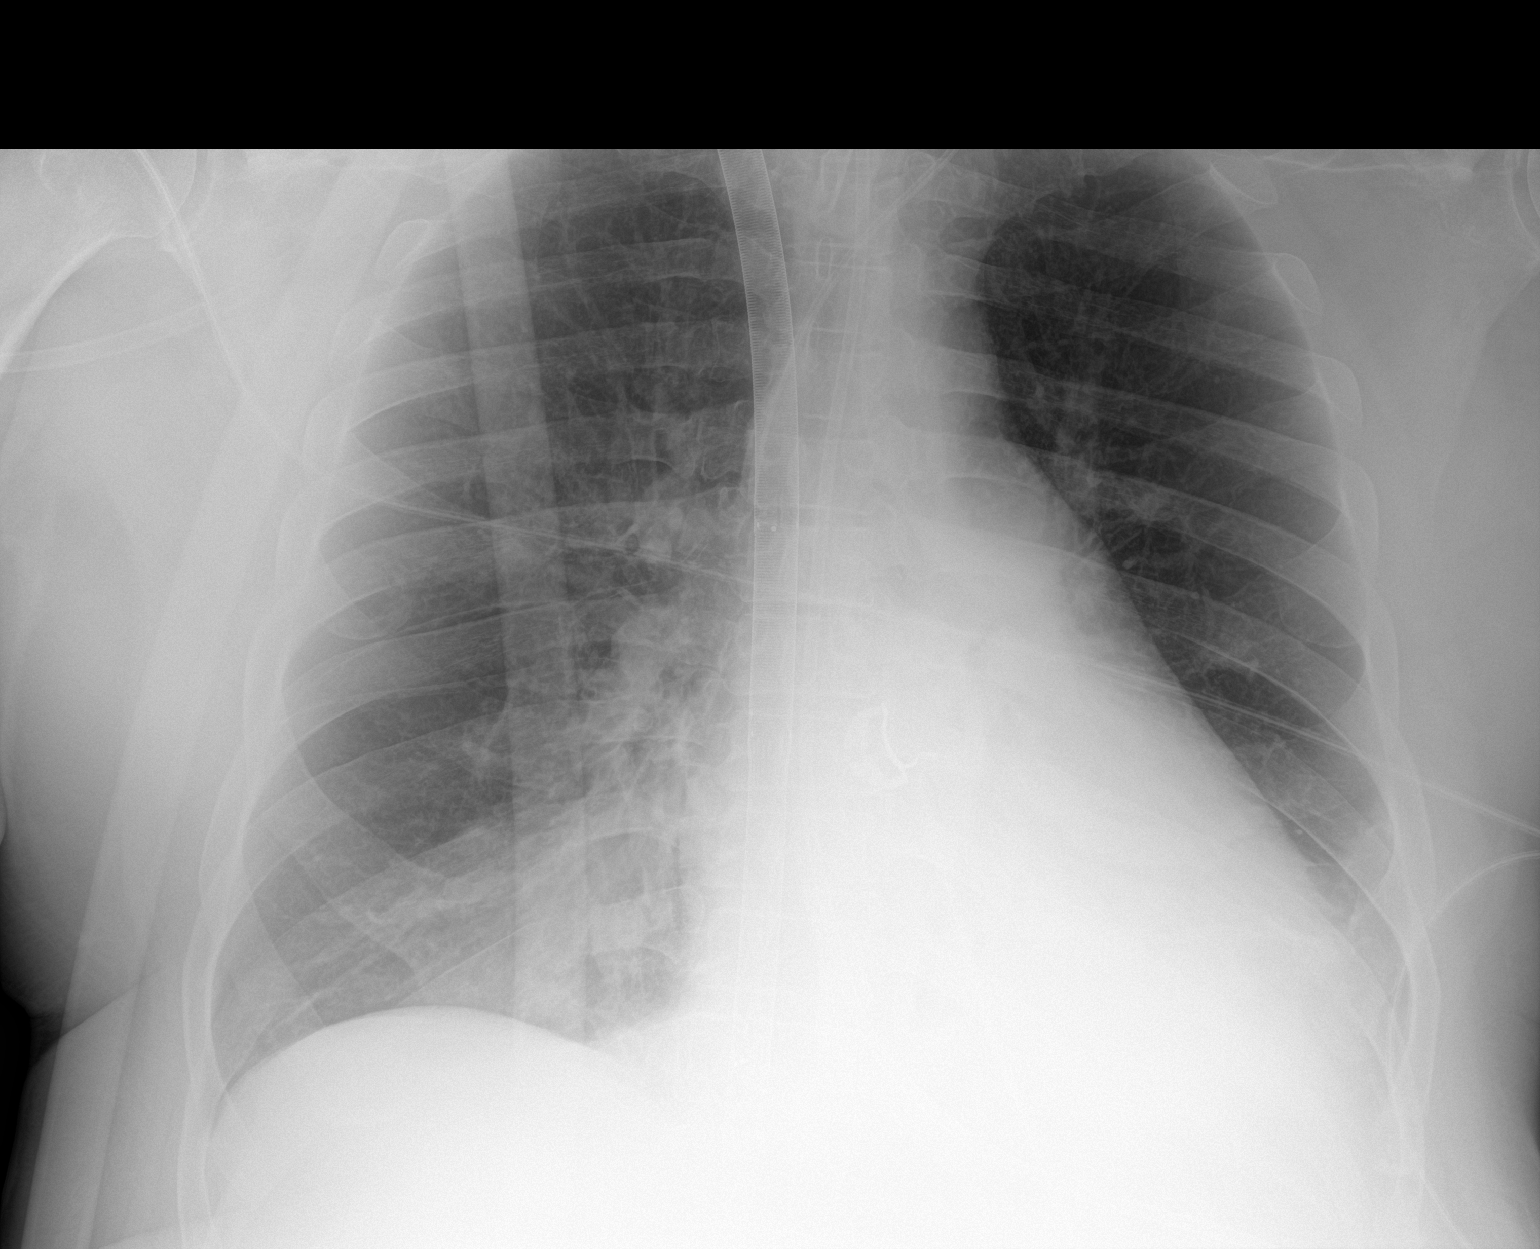

[1 of 1 positions shown; findings below may reference images not displayed]

FINDINGS: A large-bore ECMO cannula is again seen in stable position. A
tracheostomy tube tip is in stable position in the midthoracic
trachea. The enteric catheter tip courses off the field of view.

The heart is enlarged, unchanged. There is worsening left basilar
retrocardiac opacity. The right lung is clear. There is probably a
small left pleural effusion, increased in the interim. There is no
significant right effusion. There is no pneumothorax.

There is no acute osseous abnormality peer
IMPRESSION: Worsening left basilar retrocardiac opacity and suspected new small
left pleural effusion. Developing infection cannot be excluded.

## 2022-04-13 IMAGING — DX DG ABD PORTABLE 1V
1 series · 1 of 1 positions shown · non-contrast
Comparison: June 28, 2021

CLINICAL DATA: Evaluate NG tube placement

EXAM:
PORTABLE ABDOMEN - 1 VIEW

[abdomen supine]
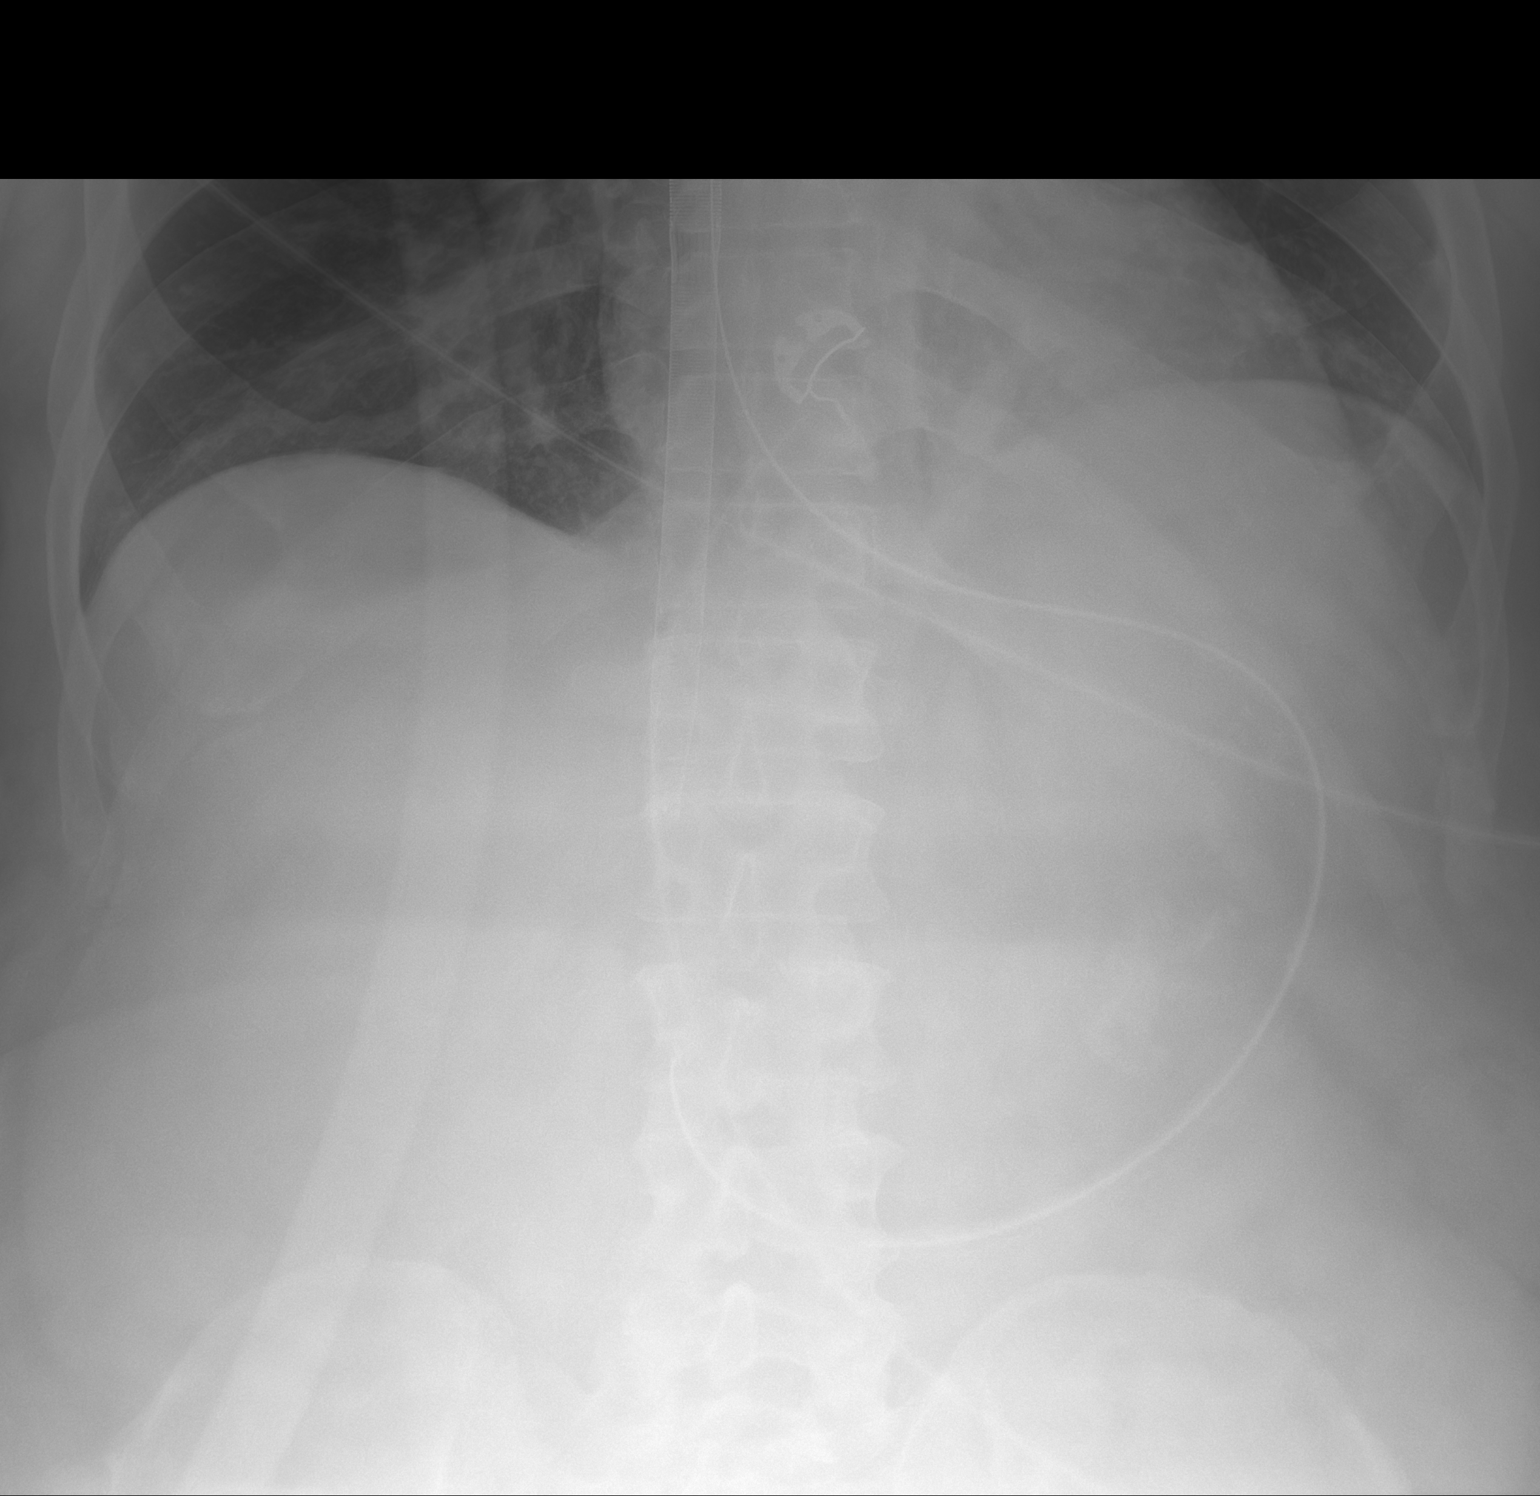

[1 of 1 positions shown; findings below may reference images not displayed]

FINDINGS: The NG tube terminates in the stomach.
IMPRESSION: The NG tube terminates in the stomach.

## 2022-04-13 IMAGING — DX DG CHEST 1V PORT
1 series · 2 of 2 positions shown · non-contrast
Comparison: 06/29/2021

CLINICAL DATA: Respiratory failure.

EXAM:
PORTABLE CHEST 1 VIEW

[Series 1: chest · 0.14mm/px · 2 of 2 slices shown]
[im 1/2]
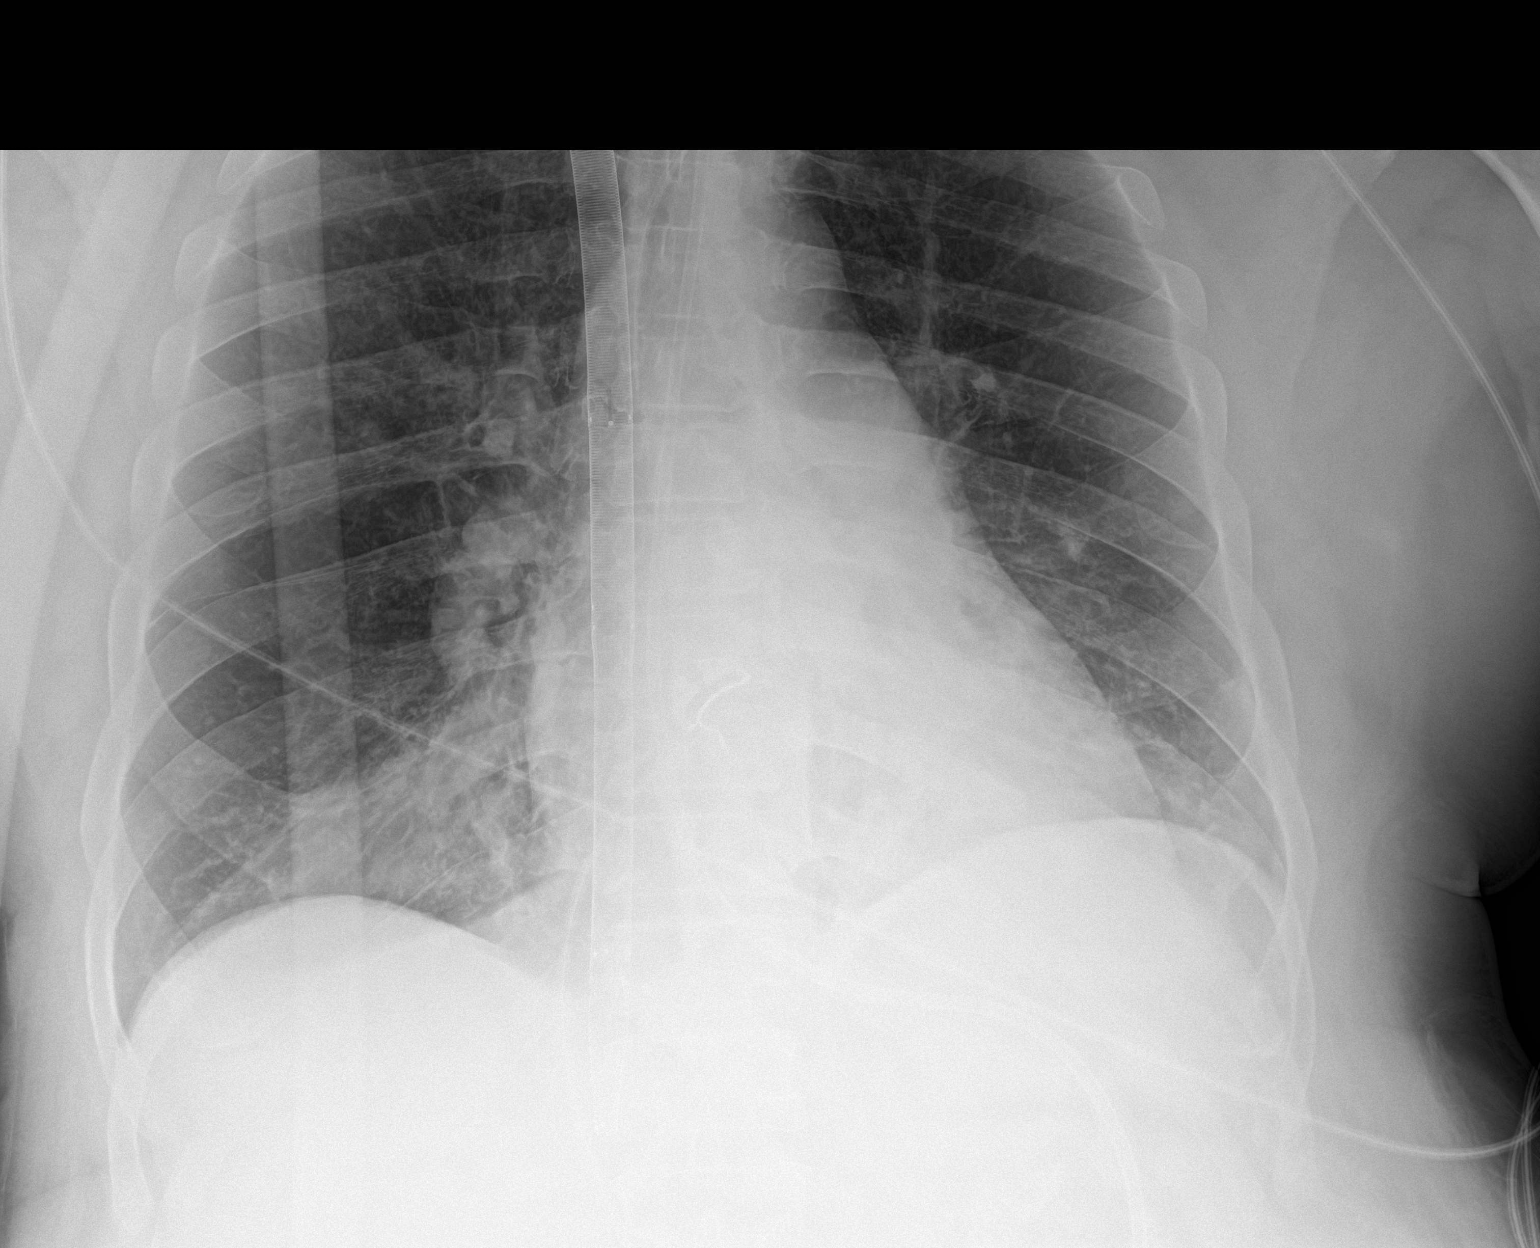
[im 2/2]
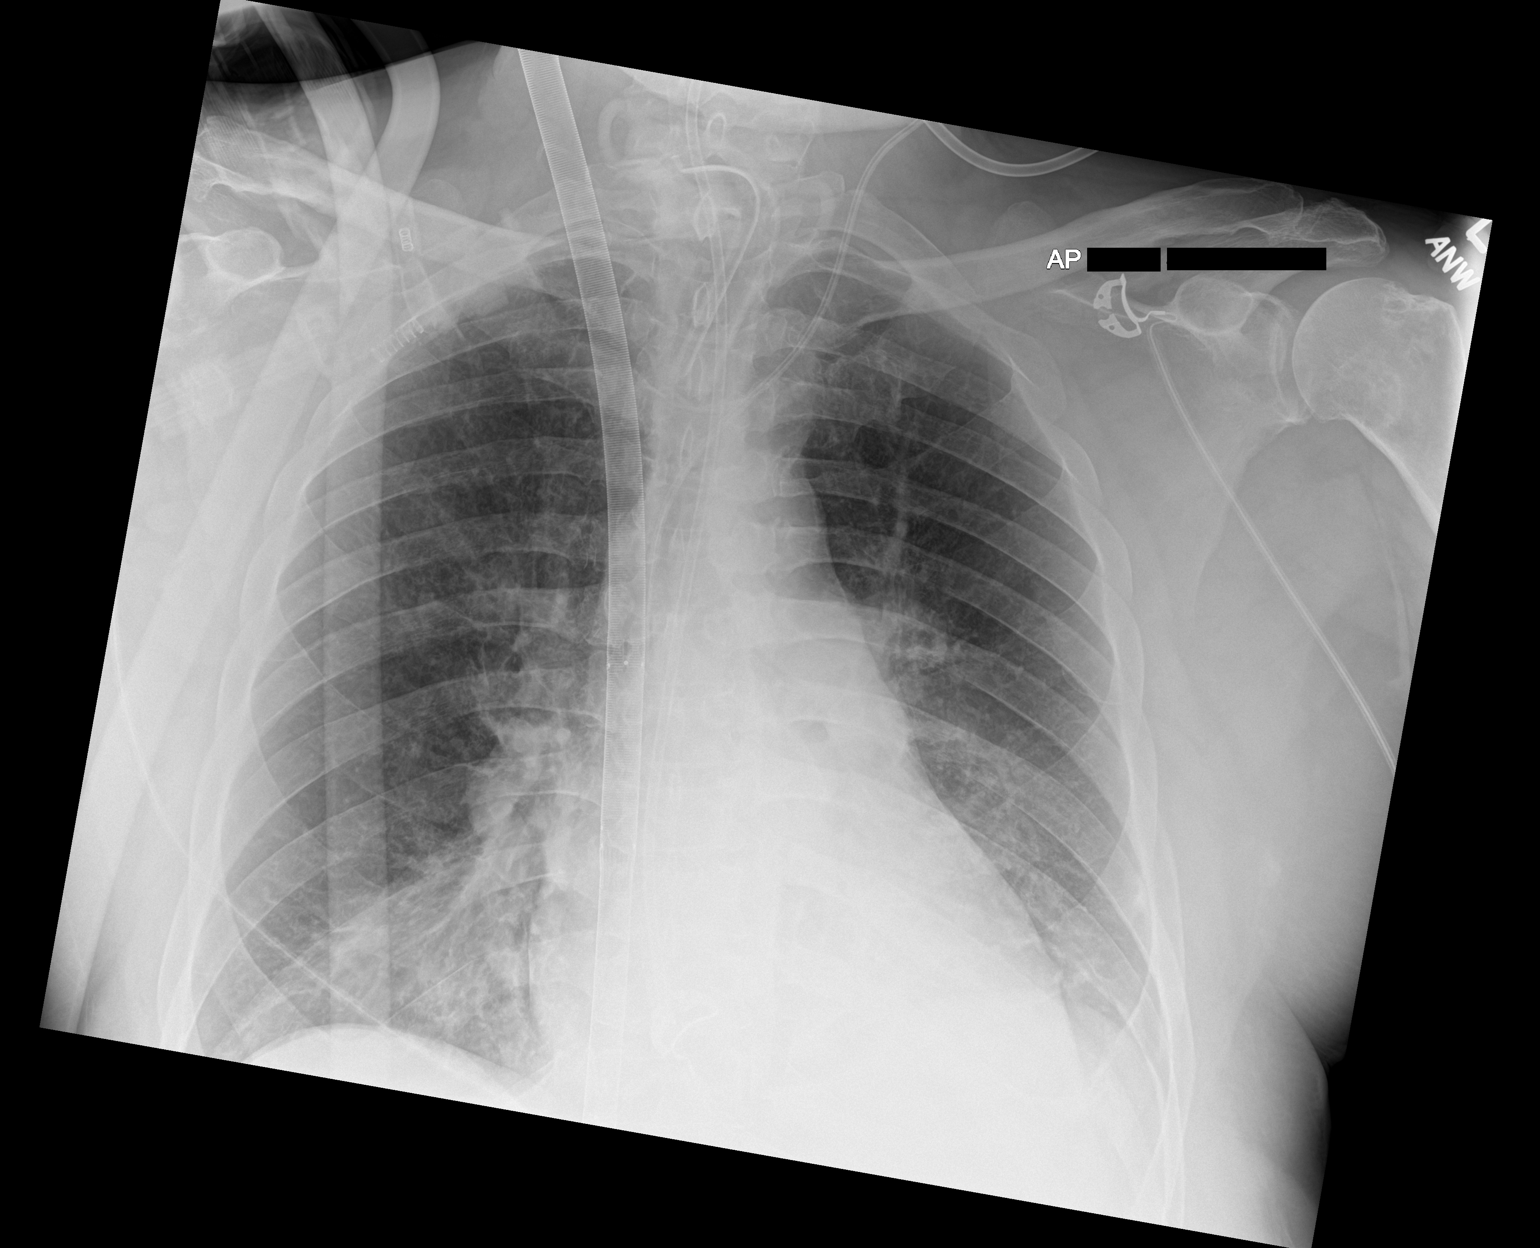

[2 of 2 positions shown; findings below may reference images not displayed]

FINDINGS: 9494 hours. Endotracheal tube tip is 7.7 cm above the base of the
carina. A feeding tube passes into the stomach although the distal
tip position is not included on the film. ECMO cannula again noted.
Bibasilar atelectasis again noted with interval improvement in
aeration at the left base. No substantial pleural effusion.
Telemetry leads overlie the chest.
IMPRESSION: 1. Interval improvement in aeration at the left base.
2. Otherwise no substantial change.

## 2022-04-14 IMAGING — DX DG CHEST 1V PORT
1 series · 1 of 1 positions shown · non-contrast
Comparison: Previous studies including the examination of
06/30/2021

CLINICAL DATA: Difficulty breathing

EXAM:
PORTABLE CHEST 1 VIEW

[chest ap]
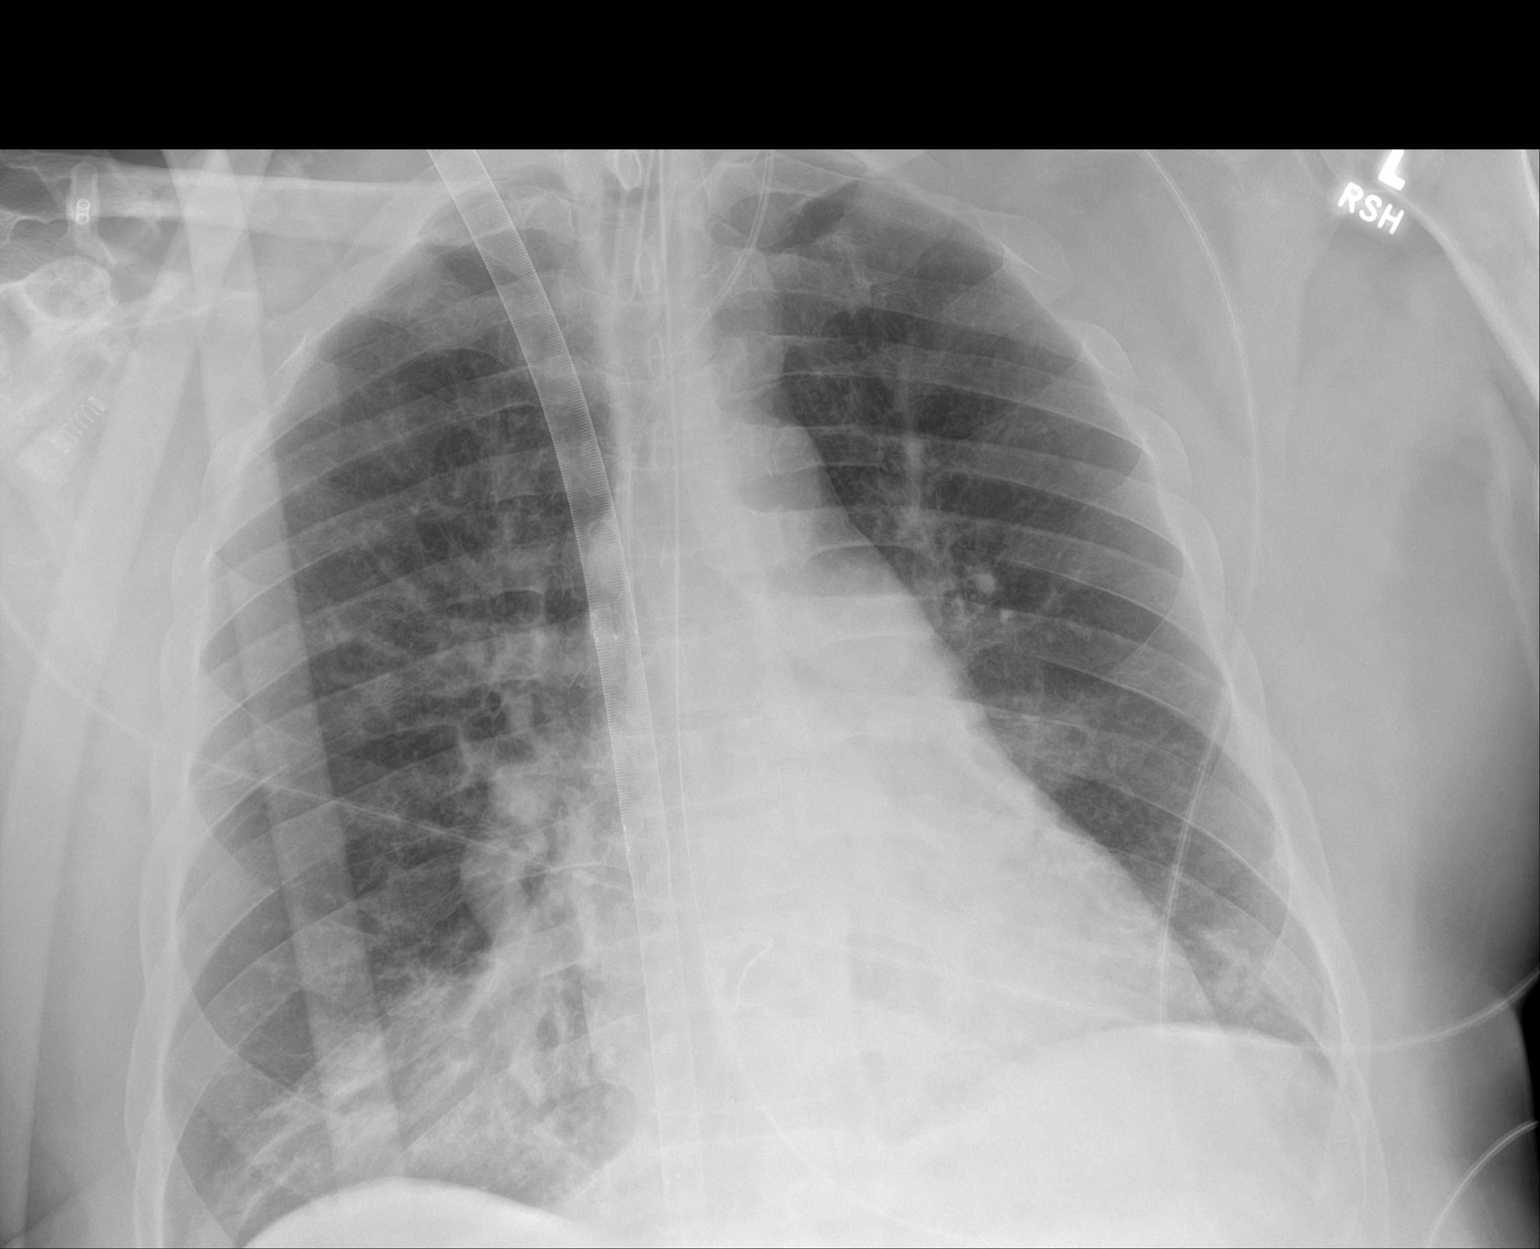

[1 of 1 positions shown; findings below may reference images not displayed]

FINDINGS: Transverse diameter of heart is within normal limits. Patchy
infiltrates are seen in both lower lung fields, more so on the right
side with interval worsening. There is prominence of interstitial
markings in the parahilar regions. Tip of tracheostomy is proximally
9 cm above the carina. Enteric tube is noted traversing the
esophagus. Tip of left jugular intravenous catheter is seen in the
region of superior vena cava. There is a large caliber right jugular
catheter, ECMO cannula with its distal course at the junction of
inferior vena cava and right atrium.
IMPRESSION: There are patchy infiltrates in both lower lung fields, more so on
the right side with interval worsening suggesting
atelectasis/pneumonia.

Other findings as described in the body of the report.

## 2022-04-15 IMAGING — DX DG CHEST 1V PORT
1 series · 1 of 1 positions shown · non-contrast
Comparison: 07/01/2021.

CLINICAL DATA: Tracheostomy.  Hypoxia.  Recent ECMO.

EXAM:
PORTABLE CHEST 1 VIEW

[chest ap]
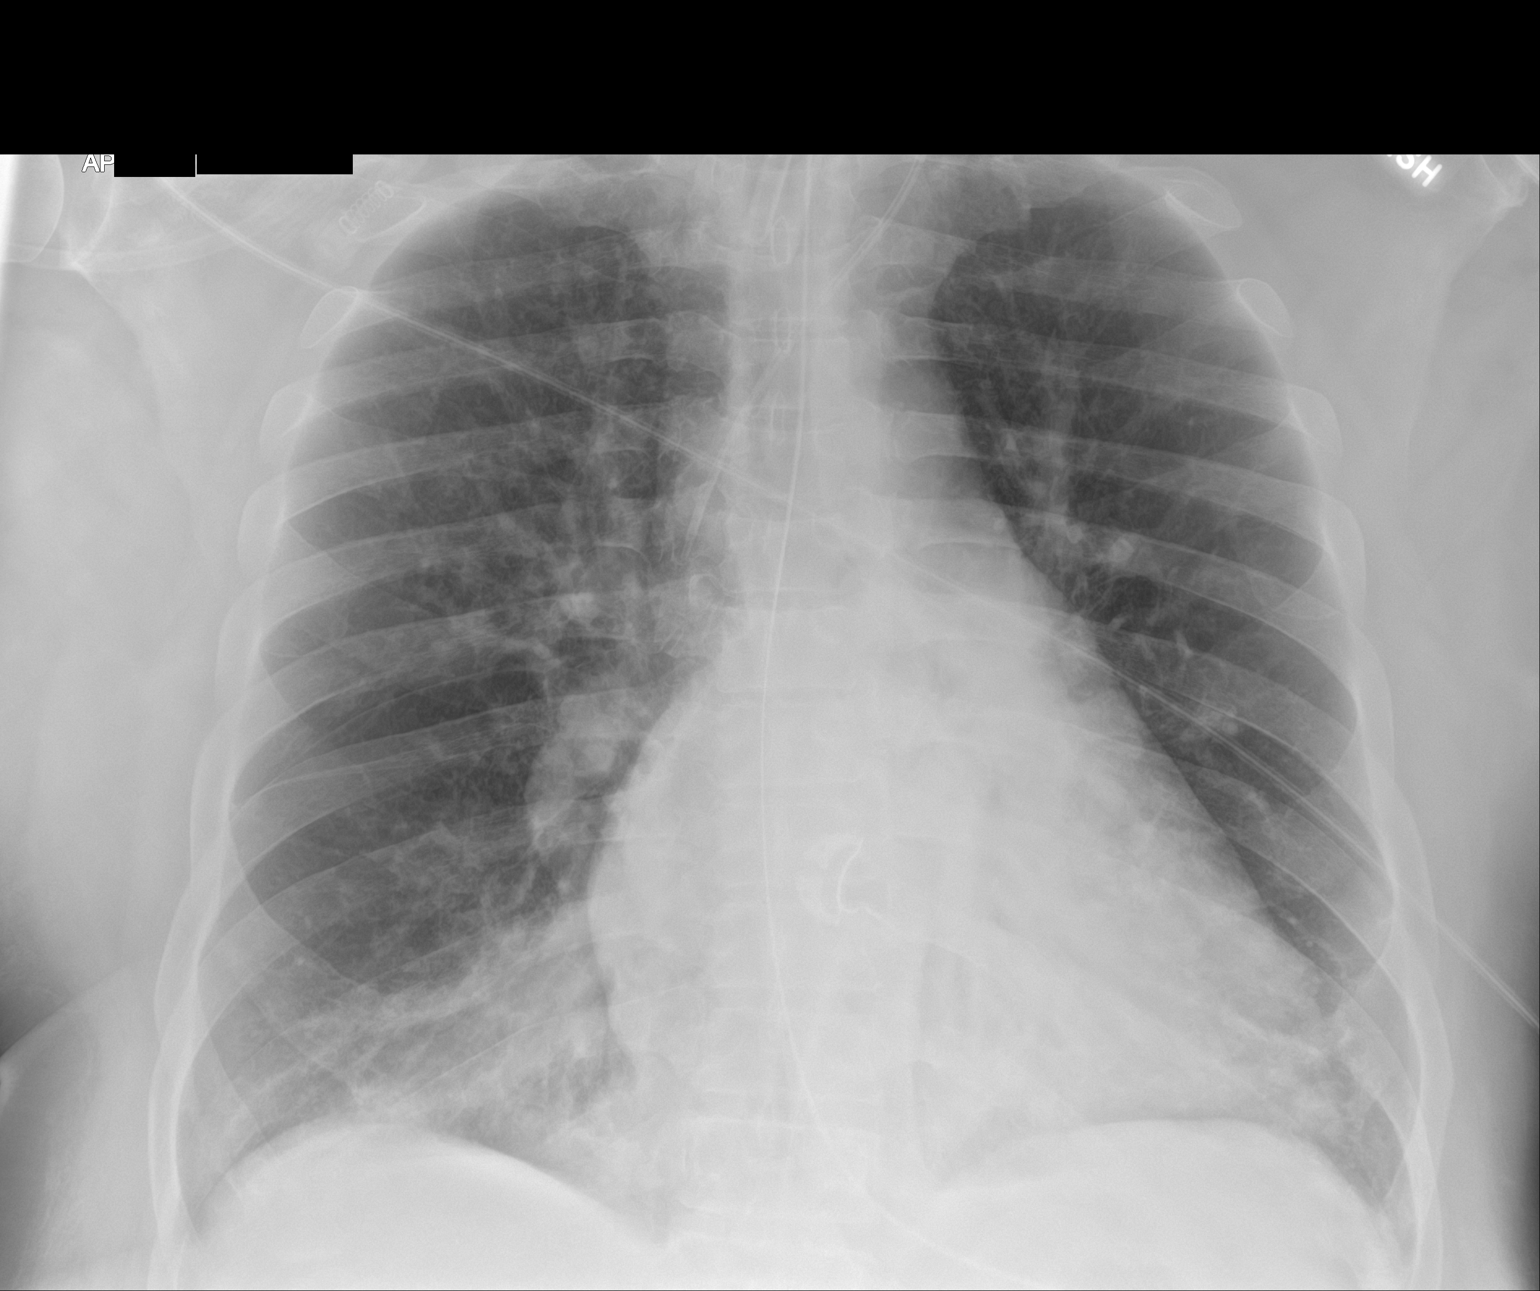

[1 of 1 positions shown; findings below may reference images not displayed]

FINDINGS: Interim removal of ECMO device. Endotracheal tube, NG tube, left IJ
line in stable position. Cardiomegaly with mild pulmonary venous
congestion. Mild bibasilar atelectasis and infiltrates again noted
unchanged from prior exam. No pleural effusion or pneumothorax.
IMPRESSION: 1. Interim removal of ECMO device. Remaining lines and tubes in
stable position.

2.  Cardiomegaly with mild pulmonary venous congestion.

3. Mild bibasilar atelectasis and infiltrates again noted unchanged
from prior exam.

## 2022-04-15 IMAGING — CT CT CTA ABD/PEL W/CM AND/OR W/O CM
2 of 16 series · 10 of 46 positions shown, 17 images · IV contrast (APPLIED)
Comparison: Chest XR, 07/02/2021. KUB, 06/30/2021. Renal ultrasound
report, 10/08/2017. CTA PE, 07/23/2017.

CLINICAL DATA: GI bleed.

EXAM:
CTA ABDOMEN AND PELVIS WITHOUT AND WITH CONTRAST
TECHNIQUE: Multidetector CT imaging of the abdomen and pelvis was performed
using the standard protocol during bolus administration of
intravenous contrast. Multiplanar reconstructed images and MIPs were
obtained and reviewed to evaluate the vascular anatomy.
CONTRAST:  75mL OMNIPAQUE IOHEXOL 350 MG/ML SOLN

[Series 13: cor · coronal · 0.80mm/px · 1 of 160 slices shown, 2 images]
[im 80/160  soft-tissue]
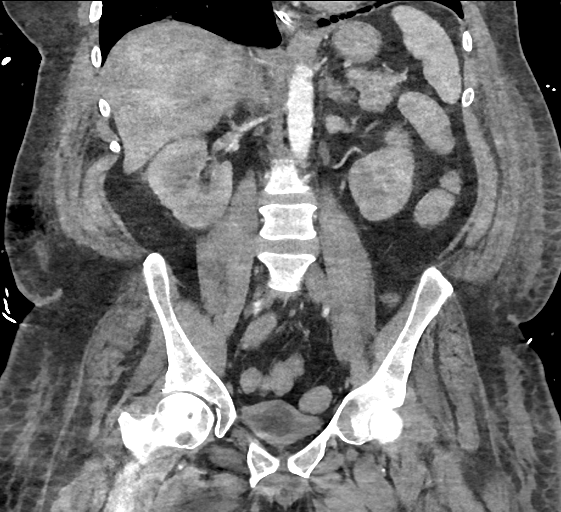
[im 80/160  bone]
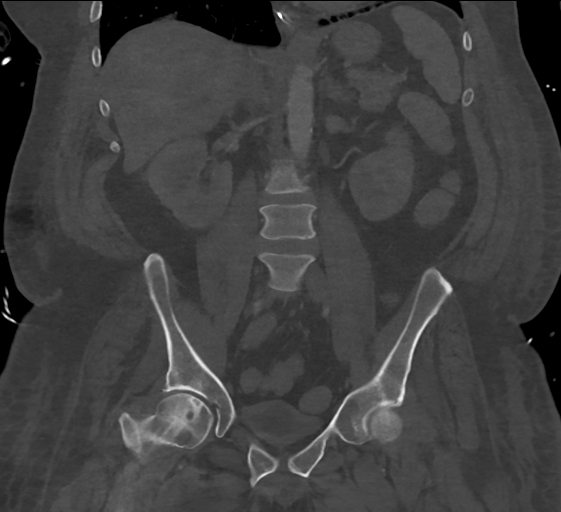

[Series 18: venous thins · axial · portal-venous · 0.98mm/px · z∈[+926,+1261]mm · 9 of 1026 slices shown, 15 images]
[im 94/1026  soft-tissue]
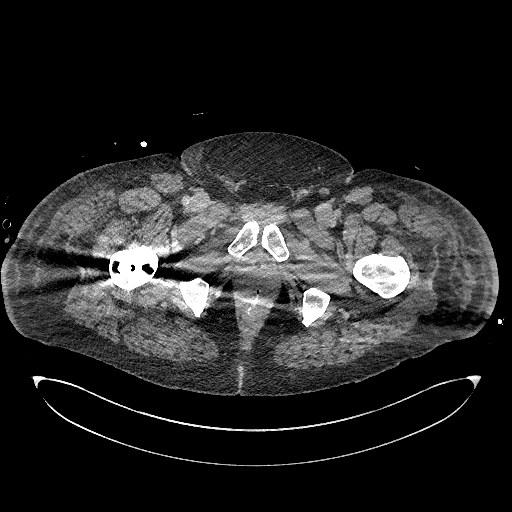
[im 94/1026  bone]
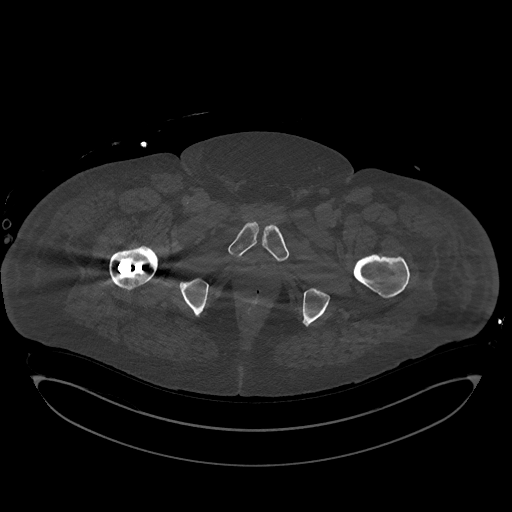
[im 187/1026  soft-tissue]
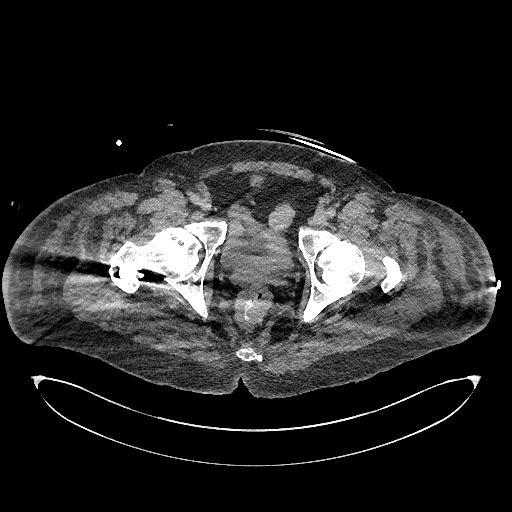
[im 280/1026  soft-tissue]
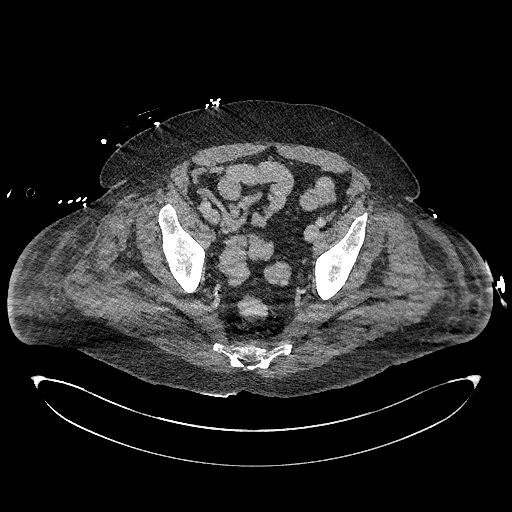
[im 373/1026  soft-tissue]
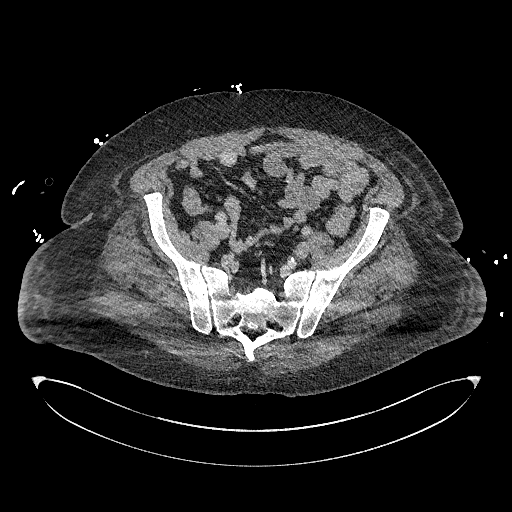
[im 560/1026  soft-tissue]
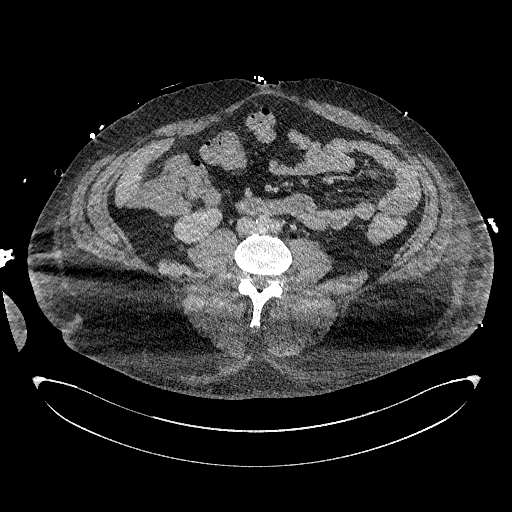
[im 653/1026  soft-tissue]
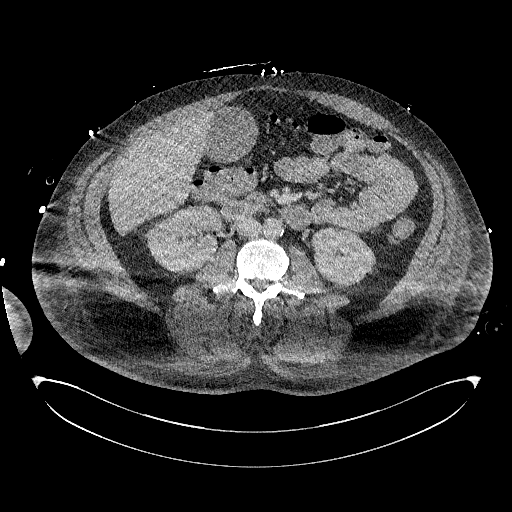
[im 653/1026  lung]
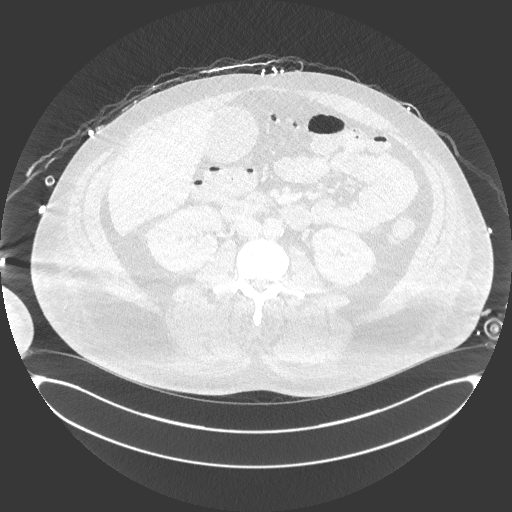
[im 746/1026  soft-tissue]
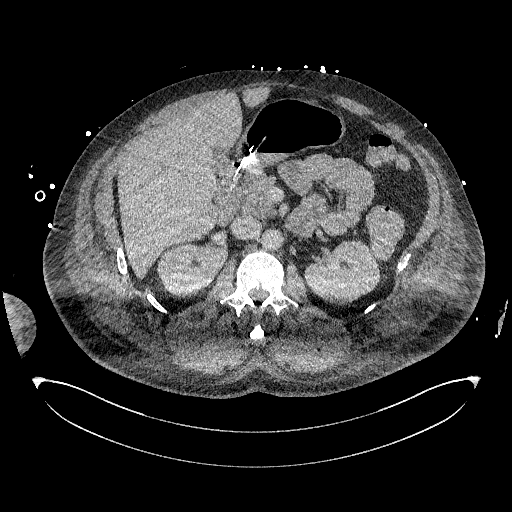
[im 746/1026  lung]
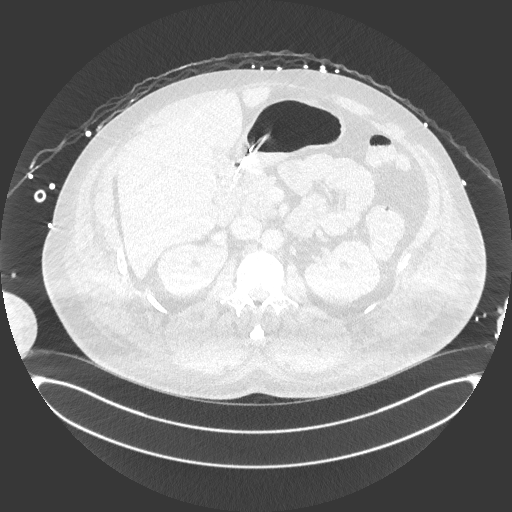
[im 839/1026  soft-tissue]
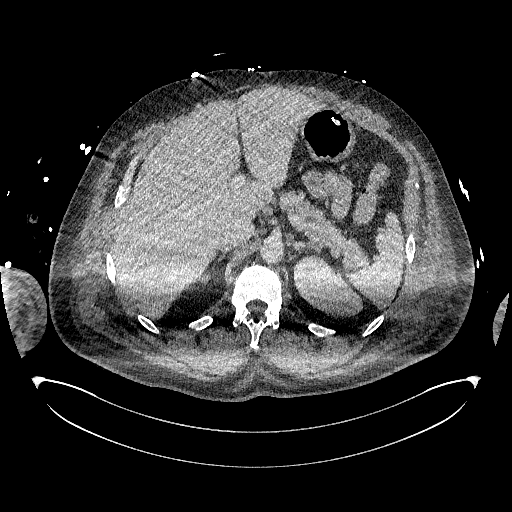
[im 839/1026  lung]
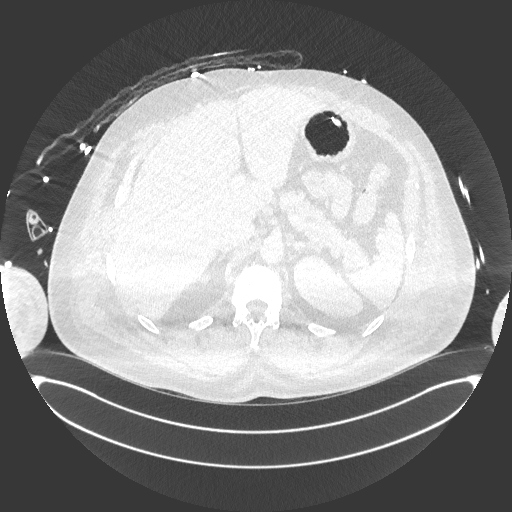
[im 932/1026  soft-tissue]
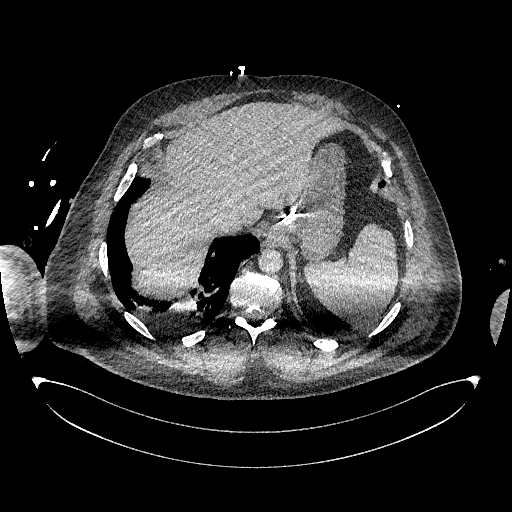
[im 932/1026  lung]
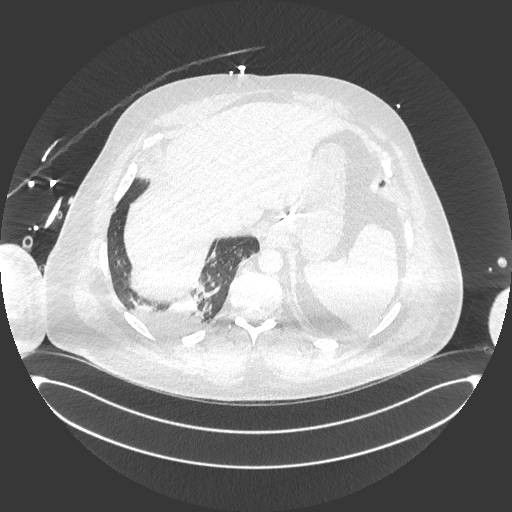
[im 932/1026  bone]
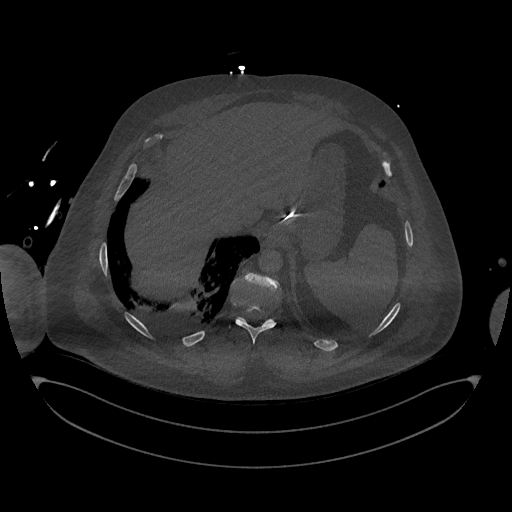

[10 of 46 positions shown; findings below may reference images not displayed]

FINDINGS: The exam is moderately degraded secondary to streak artifact and
relatively poor contrast opacification.

VASCULAR

Aorta: Mild-to-moderate calcified and noncalcified aortic
atherosclerosis. No aneurysm, dissection, vasculitis or significant
stenosis.

Celiac: Patent without evidence of aneurysm, dissection, vasculitis
or significant stenosis.

SMA: Variant anatomy, with replaced RIGHT hepatic artery. Focal
intimal thickening of the proximal SMA, in a 3 cm segment (see key
image). No significant (< 50%) stenosis.

Renals: Single RIGHT renal artery. Two LEFT renal arteries, with
small inferior pole accessory. The renal arteries are patent without
evidence of aneurysm, dissection, vasculitis, fibromuscular
dysplasia or significant stenosis.

IMA: Patent without evidence of aneurysm, dissection, vasculitis or
significant stenosis.

Inflow: Patent without evidence of aneurysm, dissection, vasculitis
or significant stenosis.

Proximal Outflow: Bilateral common femoral and visualized portions
of the superficial and profunda femoral arteries are patent without
evidence of aneurysm, dissection, vasculitis or significant
stenosis.

Veins: No obvious venous abnormality within the limitations of this
arterial phase study.

Review of the MIP images confirms the above findings.

NON-VASCULAR

Lower chest: Bibasilar dependent consolidations.

Hepatobiliary: No focal liver abnormality is seen. Mild gallbladder
distention. No gallstones, gallbladder wall thickening, or biliary
dilatation.

Pancreas: Unremarkable. No pancreatic ductal dilatation or
surrounding inflammatory changes.

Spleen: Normal in size without focal abnormality.

Adrenals/Urinary Tract: Adrenal glands are unremarkable. Kidneys are
normal, without renal calculi, focal lesion, or hydronephrosis.
Bladder is decompressed by Foley catheter. Non dependent
intraluminal air, likely from catheterization. Urachal remnant.

Stomach/Bowel: Enteric feeding tube with transpyloric tip, within
the proximal duodenum. Stomach is within normal limits. Appendix
appears normal. Nonobstructed small bowel. Nondilated colon. Fecal
management system within rectum. No evidence of bowel wall
thickening, distention, or inflammatory changes.

Lymphatic: No enlarged abdominal or pelvic lymph nodes.

Reproductive: Prostate is unremarkable.

Other: Small fat-containing paraumbilical hernia. No abdominopelvic
ascites.

Musculoskeletal: No acute osseous findings. RIGHT proximal femoral
PMN fixation.
IMPRESSION: Suboptimal evaluation, within these constraints;

VASCULAR

1. No CTA evidence of acute active extravasation to explain the
patient's reported gastrointestinal bleeding.
2. Focal intraluminal thickening of the proximal SMA, as above,
without CTA evidence of hemodynamically significant stenosis. No
evidence of vascular compromise to bowel.

NON-VASCULAR

1. Bibasilar dependent consolidations are suspicious for pneumonia.
2. NG tube with transpyloric tip within proximal duodenum. If
gastric placement was desired, recommend retraction for appropriate
placement.
3. Additional incidental and senescent findings, as above.

## 2022-04-16 IMAGING — DX DG ABD PORTABLE 1V
1 series · 1 of 1 positions shown · non-contrast
Comparison: 06/30/2021

CLINICAL DATA: Feeding tube placement

EXAM:
PORTABLE ABDOMEN - 1 VIEW

[abdomen]
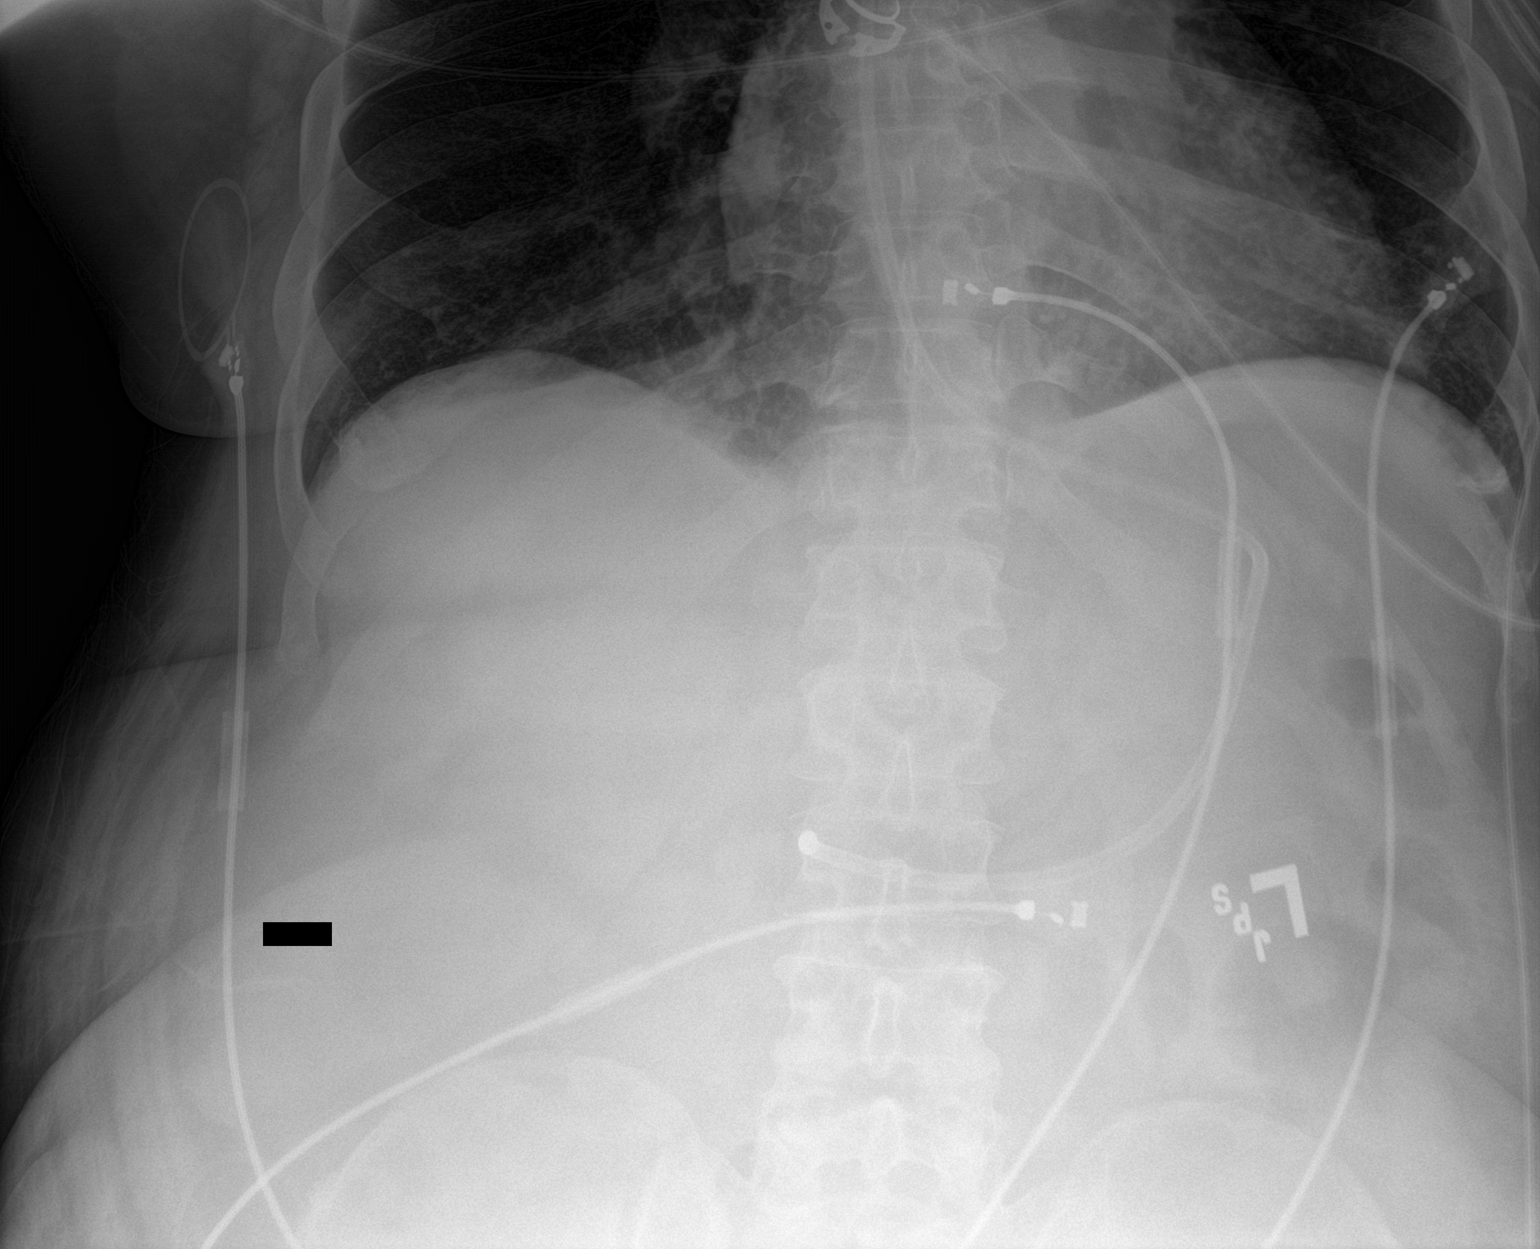

[1 of 1 positions shown; findings below may reference images not displayed]

FINDINGS: Feeding tube passes through the stomach with the tip near the
gastric antrum. Normal bowel gas pattern.
IMPRESSION: Feeding tube tip in the region the gastric antrum.

## 2024-03-06 NOTE — Progress Notes (Signed)
 Er-En

## 2024-03-16 NOTE — Procedures (Signed)
 No show
